# Patient Record
Sex: Female | Born: 1945 | Race: White | Hispanic: No | Marital: Married | Smoking: Former smoker
Health system: Southern US, Community
[De-identification: ages and names within clinical notes are randomized; demographics above are authoritative.]

## PROBLEM LIST (undated history)

## (undated) DIAGNOSIS — Z87442 Personal history of urinary calculi: Secondary | ICD-10-CM

## (undated) DIAGNOSIS — J90 Pleural effusion, not elsewhere classified: Secondary | ICD-10-CM

## (undated) DIAGNOSIS — I350 Nonrheumatic aortic (valve) stenosis: Secondary | ICD-10-CM

## (undated) DIAGNOSIS — H269 Unspecified cataract: Secondary | ICD-10-CM

## (undated) DIAGNOSIS — N189 Chronic kidney disease, unspecified: Secondary | ICD-10-CM

## (undated) DIAGNOSIS — I509 Heart failure, unspecified: Secondary | ICD-10-CM

## (undated) DIAGNOSIS — I38 Endocarditis, valve unspecified: Secondary | ICD-10-CM

## (undated) DIAGNOSIS — R278 Other lack of coordination: Secondary | ICD-10-CM

## (undated) DIAGNOSIS — M1711 Unilateral primary osteoarthritis, right knee: Secondary | ICD-10-CM

## (undated) DIAGNOSIS — J9622 Acute and chronic respiratory failure with hypercapnia: Secondary | ICD-10-CM

## (undated) DIAGNOSIS — E785 Hyperlipidemia, unspecified: Secondary | ICD-10-CM

## (undated) DIAGNOSIS — D72829 Elevated white blood cell count, unspecified: Secondary | ICD-10-CM

## (undated) DIAGNOSIS — I10 Essential (primary) hypertension: Secondary | ICD-10-CM

## (undated) DIAGNOSIS — J45909 Unspecified asthma, uncomplicated: Secondary | ICD-10-CM

## (undated) DIAGNOSIS — M5430 Sciatica, unspecified side: Secondary | ICD-10-CM

## (undated) DIAGNOSIS — J9621 Acute and chronic respiratory failure with hypoxia: Secondary | ICD-10-CM

## (undated) DIAGNOSIS — D649 Anemia, unspecified: Secondary | ICD-10-CM

## (undated) DIAGNOSIS — D509 Iron deficiency anemia, unspecified: Secondary | ICD-10-CM

## (undated) DIAGNOSIS — E119 Type 2 diabetes mellitus without complications: Secondary | ICD-10-CM

## (undated) DIAGNOSIS — N3281 Overactive bladder: Secondary | ICD-10-CM

## (undated) DIAGNOSIS — N183 Chronic kidney disease, stage 3 unspecified: Secondary | ICD-10-CM

## (undated) DIAGNOSIS — J449 Chronic obstructive pulmonary disease, unspecified: Secondary | ICD-10-CM

## (undated) DIAGNOSIS — R609 Edema, unspecified: Secondary | ICD-10-CM

## (undated) DIAGNOSIS — N289 Disorder of kidney and ureter, unspecified: Secondary | ICD-10-CM

## (undated) DIAGNOSIS — R001 Bradycardia, unspecified: Secondary | ICD-10-CM

## (undated) DIAGNOSIS — K5520 Angiodysplasia of colon without hemorrhage: Secondary | ICD-10-CM

## (undated) DIAGNOSIS — I5030 Unspecified diastolic (congestive) heart failure: Secondary | ICD-10-CM

## (undated) HISTORY — PX: APPENDECTOMY: SHX54

## (undated) HISTORY — DX: Acute and chronic respiratory failure with hypoxia: J96.21

## (undated) HISTORY — DX: Chronic kidney disease, unspecified: N18.9

## (undated) HISTORY — DX: Iron deficiency anemia, unspecified: D50.9

## (undated) HISTORY — DX: Nonrheumatic aortic (valve) stenosis: I35.0

## (undated) HISTORY — DX: Sciatica, unspecified side: M54.30

## (undated) HISTORY — PX: EYE SURGERY: SHX253

## (undated) HISTORY — DX: Acute and chronic respiratory failure with hypercapnia: J96.22

## (undated) HISTORY — DX: Unilateral primary osteoarthritis, right knee: M17.11

## (undated) HISTORY — DX: Edema, unspecified: R60.9

## (undated) HISTORY — DX: Unspecified cataract: H26.9

## (undated) HISTORY — DX: Elevated white blood cell count, unspecified: D72.829

## (undated) HISTORY — PX: CHOLECYSTECTOMY: SHX55

## (undated) HISTORY — DX: Other lack of coordination: R27.8

---

## 2014-04-02 ENCOUNTER — Inpatient Hospital Stay: Payer: Self-pay | Admitting: Internal Medicine

## 2014-04-02 LAB — CBC WITH DIFFERENTIAL/PLATELET
BASOS ABS: 0 10*3/uL (ref 0.0–0.1)
Basophil %: 0.2 %
EOS ABS: 0 10*3/uL (ref 0.0–0.7)
EOS PCT: 0.1 %
HCT: 35.2 % (ref 35.0–47.0)
HGB: 11.4 g/dL — ABNORMAL LOW (ref 12.0–16.0)
LYMPHS ABS: 0.6 10*3/uL — AB (ref 1.0–3.6)
Lymphocyte %: 2.7 %
MCH: 28.4 pg (ref 26.0–34.0)
MCHC: 32.5 g/dL (ref 32.0–36.0)
MCV: 87 fL (ref 80–100)
Monocyte #: 1.6 x10 3/mm — ABNORMAL HIGH (ref 0.2–0.9)
Monocyte %: 7.7 %
NEUTROS PCT: 89.3 %
Neutrophil #: 18.4 10*3/uL — ABNORMAL HIGH (ref 1.4–6.5)
PLATELETS: 171 10*3/uL (ref 150–440)
RBC: 4.03 10*6/uL (ref 3.80–5.20)
RDW: 14.3 % (ref 11.5–14.5)
WBC: 20.6 10*3/uL — ABNORMAL HIGH (ref 3.6–11.0)

## 2014-04-02 LAB — URINALYSIS, COMPLETE
Bilirubin,UR: NEGATIVE
Nitrite: POSITIVE
PH: 5 (ref 4.5–8.0)
SPECIFIC GRAVITY: 1.024 (ref 1.003–1.030)
Squamous Epithelial: <1
WBC UR: 90 /HPF (ref 0–5)

## 2014-04-02 LAB — COMPREHENSIVE METABOLIC PANEL
ALT: 20 U/L (ref 14–63)
Albumin: 3 g/dL — ABNORMAL LOW (ref 3.4–5.0)
Alkaline Phosphatase: 77 U/L (ref 46–116)
Anion Gap: 7 (ref 7–16)
BUN: 14 mg/dL (ref 7–18)
Bilirubin,Total: 1 mg/dL (ref 0.2–1.0)
Calcium, Total: 8.7 mg/dL (ref 8.5–10.1)
Chloride: 97 mmol/L — ABNORMAL LOW (ref 98–107)
Co2: 28 mmol/L (ref 21–32)
Creatinine: 0.77 mg/dL (ref 0.60–1.30)
EGFR (African American): >60
EGFR (Non-African Amer.): >60
Glucose: 292 mg/dL — ABNORMAL HIGH (ref 65–99)
Osmolality: 276 (ref 275–301)
Potassium: 5.6 mmol/L — ABNORMAL HIGH (ref 3.5–5.1)
SGOT(AST): 49 U/L — ABNORMAL HIGH (ref 15–37)
Sodium: 132 mmol/L — ABNORMAL LOW (ref 136–145)
TOTAL PROTEIN: 6.3 g/dL — AB (ref 6.4–8.2)

## 2014-04-02 LAB — BASIC METABOLIC PANEL
ANION GAP: 5 — AB (ref 7–16)
BUN: 13 mg/dL (ref 7–18)
CHLORIDE: 97 mmol/L — AB (ref 98–107)
CO2: 32 mmol/L (ref 21–32)
Calcium, Total: 8.7 mg/dL (ref 8.5–10.1)
Creatinine: 0.95 mg/dL (ref 0.60–1.30)
EGFR (African American): >60
EGFR (Non-African Amer.): >60
Glucose: 316 mg/dL — ABNORMAL HIGH (ref 65–99)
OSMOLALITY: 280 (ref 275–301)
POTASSIUM: 3.9 mmol/L (ref 3.5–5.1)
Sodium: 134 mmol/L — ABNORMAL LOW (ref 136–145)

## 2014-04-02 LAB — TROPONIN I: Troponin-I: <0.02 ng/mL

## 2014-04-02 LAB — LIPASE, BLOOD: Lipase: 27 U/L — ABNORMAL LOW (ref 73–393)

## 2014-04-02 LAB — MAGNESIUM: MAGNESIUM: 1.3 mg/dL — AB

## 2014-04-03 LAB — CBC WITH DIFFERENTIAL/PLATELET
BASOS PCT: 0.3 %
Basophil #: 0.1 10*3/uL (ref 0.0–0.1)
EOS ABS: 0.1 10*3/uL (ref 0.0–0.7)
Eosinophil %: 0.5 %
HCT: 36 % (ref 35.0–47.0)
HGB: 11.7 g/dL — ABNORMAL LOW (ref 12.0–16.0)
LYMPHS ABS: 1.4 10*3/uL (ref 1.0–3.6)
Lymphocyte %: 7.1 %
MCH: 28.8 pg (ref 26.0–34.0)
MCHC: 32.5 g/dL (ref 32.0–36.0)
MCV: 89 fL (ref 80–100)
MONO ABS: 1.1 x10 3/mm — AB (ref 0.2–0.9)
Monocyte %: 5.4 %
NEUTROS PCT: 86.7 %
Neutrophil #: 17 10*3/uL — ABNORMAL HIGH (ref 1.4–6.5)
Platelet: 157 10*3/uL (ref 150–440)
RBC: 4.07 10*6/uL (ref 3.80–5.20)
RDW: 14.1 % (ref 11.5–14.5)
WBC: 19.6 10*3/uL — AB (ref 3.6–11.0)

## 2014-04-03 LAB — BASIC METABOLIC PANEL
ANION GAP: 6 — AB (ref 7–16)
BUN: 15 mg/dL (ref 7–18)
CHLORIDE: 98 mmol/L (ref 98–107)
CREATININE: 0.89 mg/dL (ref 0.60–1.30)
Calcium, Total: 9.5 mg/dL (ref 8.5–10.1)
Co2: 31 mmol/L (ref 21–32)
EGFR (African American): >60
EGFR (Non-African Amer.): >60
Glucose: 201 mg/dL — ABNORMAL HIGH (ref 65–99)
Osmolality: 277 (ref 275–301)
Potassium: 4.1 mmol/L (ref 3.5–5.1)
SODIUM: 135 mmol/L — AB (ref 136–145)

## 2014-04-04 LAB — CBC WITH DIFFERENTIAL/PLATELET
BASOS ABS: 0 10*3/uL (ref 0.0–0.1)
Basophil %: 0.3 %
EOS ABS: 0.2 10*3/uL (ref 0.0–0.7)
Eosinophil %: 1.2 %
HCT: 33.6 % — ABNORMAL LOW (ref 35.0–47.0)
HGB: 10.8 g/dL — ABNORMAL LOW (ref 12.0–16.0)
LYMPHS ABS: 0.9 10*3/uL — AB (ref 1.0–3.6)
Lymphocyte %: 7.3 %
MCH: 28.3 pg (ref 26.0–34.0)
MCHC: 32.1 g/dL (ref 32.0–36.0)
MCV: 88 fL (ref 80–100)
MONO ABS: 1 x10 3/mm — AB (ref 0.2–0.9)
Monocyte %: 7.5 %
Neutrophil #: 10.6 10*3/uL — ABNORMAL HIGH (ref 1.4–6.5)
Neutrophil %: 83.7 %
PLATELETS: 160 10*3/uL (ref 150–440)
RBC: 3.82 10*6/uL (ref 3.80–5.20)
RDW: 14.3 % (ref 11.5–14.5)
WBC: 12.7 10*3/uL — ABNORMAL HIGH (ref 3.6–11.0)

## 2014-04-04 LAB — BASIC METABOLIC PANEL
ANION GAP: 6 — AB (ref 7–16)
BUN: 11 mg/dL (ref 7–18)
CHLORIDE: 95 mmol/L — AB (ref 98–107)
CO2: 35 mmol/L — AB (ref 21–32)
CREATININE: 0.75 mg/dL (ref 0.60–1.30)
Calcium, Total: 9.6 mg/dL (ref 8.5–10.1)
EGFR (African American): >60
EGFR (Non-African Amer.): >60
Glucose: 201 mg/dL — ABNORMAL HIGH (ref 65–99)
OSMOLALITY: 277 (ref 275–301)
Potassium: 3.6 mmol/L (ref 3.5–5.1)
Sodium: 136 mmol/L (ref 136–145)

## 2014-04-05 LAB — URINE CULTURE

## 2014-04-20 DIAGNOSIS — R609 Edema, unspecified: Secondary | ICD-10-CM

## 2014-04-20 DIAGNOSIS — R600 Localized edema: Secondary | ICD-10-CM | POA: Insufficient documentation

## 2014-04-20 DIAGNOSIS — I509 Heart failure, unspecified: Secondary | ICD-10-CM | POA: Insufficient documentation

## 2014-04-20 HISTORY — DX: Edema, unspecified: R60.9

## 2014-04-20 HISTORY — DX: Localized edema: R60.0

## 2014-06-18 ENCOUNTER — Ambulatory Visit
Admit: 2014-06-18 | Disposition: A | Discharge status: Home / Self Care | Payer: Self-pay | Attending: Urology | Admitting: Urology

## 2014-06-18 LAB — BASIC METABOLIC PANEL
Anion Gap: 8 (ref 7–16)
BUN: 22 mg/dL — AB
CO2: 32 mmol/L
Calcium, Total: 9.3 mg/dL
Chloride: 96 mmol/L — ABNORMAL LOW
Creatinine: 0.78 mg/dL
EGFR (Non-African Amer.): >60
GLUCOSE: 274 mg/dL — AB
Potassium: 4.5 mmol/L
Sodium: 136 mmol/L

## 2014-06-22 DIAGNOSIS — D509 Iron deficiency anemia, unspecified: Secondary | ICD-10-CM

## 2014-06-22 DIAGNOSIS — R809 Proteinuria, unspecified: Secondary | ICD-10-CM | POA: Insufficient documentation

## 2014-06-22 HISTORY — DX: Iron deficiency anemia, unspecified: D50.9

## 2014-06-29 ENCOUNTER — Ambulatory Visit
Admit: 2014-06-29 | Disposition: A | Discharge status: Home / Self Care | Payer: Self-pay | Attending: Urology | Admitting: Urology

## 2014-07-05 NOTE — Consult Note (Signed)
PATIENT NAME:  Karen [name], Karen [name] MR#:  [id number] DATE OF BIRTH:  1946/01/15  DATE OF CONSULTATION:  04/02/2014  REFERRING PHYSICIAN:  Posey Pronto, MD  CONSULTING PHYSICIAN:  Andria Meuse, NP  PRIMARY CARE PHYSICIAN:  Nonlocal.    CONSULTING GASTROENTEROLOGIST:  Lucilla Lame, MD   REASON FOR CONSULTATION: Bilious emesis.   HISTORY OF PRESENT ILLNESS: Karen [name] is a 69 year old Caucasian female who was in her usual state of health and developed severe abdominal pain that has lasted for 3 to 4 days. She ate barbecue with her son last night and began to have severe nausea and vomiting at 1:00 a.m. The pain is mostly in her right lower quadrant, is 8/10 on pain scale. She has seen no gross hematuria. She denies any heartburn or indigestion. Her weight has been stable. She denies any diarrhea or constipation. She denies any rectal bleeding or melena. She has never had a colonoscopy. She was admitted with a white blood cell count of 20.6, her hemoglobin is 11.4. CT scan of abdomen and pelvis shows moderate right-sided urinary tract obstruction from a proximal ureteral stone and mild hepatomegaly. She was admitted with UTI, sepsis and hyperkalemia with a potassium of 5.6.   PAST MEDICAL AND SURGICAL HISTORY: COPD, CHF, diabetes mellitus, hypertension, C-section, cholecystectomy, bilateral lens transplant and appendectomy.   MEDICATIONS PRIOR TO ADMISSION: Amlodipine 5 mg daily, aspirin 81 mg daily, atorvastatin 10 mg at bedtime, ferrous sulfate 325 mg daily, furosemide 20 mg daily, Levemir  40 units subcutaneously at bedtime; metformin 500 mg extended-release 3 tablets every morning, 1 tablet every evening; Montelukast 10 mg tablet daily, NovoLog subcutaneous sliding scale insulin.   ALLERGIES: GABAPENTIN AND LISINOPRIL.   FAMILY HISTORY: There is no known family history of colorectal carcinoma, liver or chronic GI problems.   SOCIAL HISTORY: She lives with her husband and son. She has 3 healthy  children. She is a retired Chief Financial Officer. She denies any tobacco, alcohol or illicit drug use. She quit smoking 25 years ago.   REVIEW OF SYSTEMS:  See history of present illness.  CONSTITUTIONAL: Overall she feels fatigue, malaise, weakness; she has had chills.  RESPIRATORY: She has shortness of breath on 3 liters of oxygen at home, worse recently, otherwise negative 12 point review of systems.   PHYSICAL EXAMINATION:  VITAL SIGNS: BMI 38.2, height 62.9 inches, weight 215.6 pounds, temperature is 98.1, pulse 114, blood pressure 147/64, respirations 20, with O2 saturation 90% on 3 liters via nasal cannula.  GENERAL: Karen [name] is alert, oriented, pleasant, and cooperative; she seems very uncomfortable, she is accompanied by her husband and her son.  HEENT: Sclerae clear, nonicteric, conjunctivae pink; oropharynx pink and moist; she is actively vomiting at the time of to the visit into a basin with orangeish, brown emesis. She has to O2 at 3 liters via nasal cannula.  NECK: Supple without any mass or thyromegaly.   CHEST: Heart mildly tachycardic. No murmurs, clicks, rubs, or gallops.  LUNGS: Clear to auscultation bilaterally.  ABDOMEN: Protuberant, positive bowel sounds x 4, no bruits auscultated; abdomen is moderately distended; she has right lower quadrant and right-sided tenderness on deep palpation, there is no rebound, tenderness or guarding; no hepatosplenomegaly or mass.  EXTREMITIES: Without edema, cyanosis or clubbing.  NEUROLOGIC: Grossly intact.  MUSCULOSKELETAL: Good equal movement and strength bilaterally.  PSYCHIATRIC: Alert, cooperative; she appears very uncomfortable with a flat affect.   DIAGNOSTIC DATA: Glucose 292, chloride 97, sodium 132, potassium 5.6, otherwise normal basic metabolic panel;  total protein 6.3, albumin 3, AST 49, otherwise normal LFTs. Lipase is normal. Urinalysis showed glucose, ketones, nitrites, LE, RBCs, WBCs and bacteria, lipase was normal and  chest x-ray was negative.   IMPRESSION: Karen [name] is a 69 year old Caucasian female admitted with acute abdominal pain, mostly right lower quadrant at this time, nausea and vomiting. I suspect her symptoms are related to urosepsis and obstructive uropathy from right proximal ureteral stones seen on CT, there was no evidence of ileus or gastric outlet obstruction on CT.   PLAN: 1.  Pain and antibiotic management per attending.  2.  Consider urology consult.  3.  Agree with Zofran as ordered.  4.  Agree with b.i.d. PPI for gastric protection.  5.  If nausea and vomiting persist despite resolution of the urinary issues would consider EGD.  6.  Due for outpatient screening colonoscopy.  7.  Follow and management of hyperkalemia per attending.   Thank you allowing [place] to participate in her care.    ____________________________ Andria Meuse, NP klj:nt D: 04/02/2014 15:38:44 ET T: 04/02/2014 16:05:40 ET JOB#: [id number]  cc: Andria Meuse, NP, <Dictator> Andria Meuse FNP ELECTRONICALLY SIGNED 04/04/2014 5:23

## 2014-07-05 NOTE — Consult Note (Signed)
Chief Complaint:  Subjective/Chief Complaint pod 1 s/p right ureteral stent placement Pain and nausea improved, tol POs, OOB/Ambulating. No fevers overnight   VITAL SIGNS/ANCILLARY NOTES: **Vital Signs.:   29-Jan-16 13:42  Vital Signs Type Routine  Temperature Temperature (F) 97.2  Celsius 36.2  Temperature Source oral  Pulse Pulse 98  Respirations Respirations 19  Systolic BP Systolic BP [id number]  Diastolic BP (mmHg) Diastolic BP (mmHg) 69  Mean BP 82  Pulse Ox % Pulse Ox % 95  Pulse Ox Activity Level  At rest  Oxygen Delivery 3L  *Intake and Output.:   Shift 29-Jan-16 15:00  Grand Totals Intake:  1440 Output:  800    Net:  640 24 Hr.:  640  Oral Intake      In:  1440  Urine ml     Out:  800  Length of Stay Totals Intake:  4000.97 Output:  1900    Net:  2100.97   Brief Assessment:  GEN well developed, well nourished   Cardiac Regular  no murmur  --Rub  --Gallop   Respiratory normal resp effort  clear BS   Gastrointestinal Normal   Gastrointestinal details normal Soft  Nontender   EXTR negative cyanosis/clubbing, negative edema   Additional Physical Exam no CVAT   Assessment/Plan:  Assessment/Plan:  Assessment 69 year old woman pod 1 s/p right ureteral stent placement for sepsis due to an obstructing proximal ureteral stone   Plan 1) D/C Foley catheter 2) Continue IV Antibiotics 3) Await final urine cultures -- will need to be discharged with appropriate PO antibiotics for 10-14 day total course 4) Will need follow-up with Urology in 1-2 weeks after discharge to arrange definitive ureteral stone management.  Thank you for including me in the care of Mrs. Jaques; please call Urology with any further questions   Electronic Signatures: Prentiss [name] (MD)  (Signed 29-Jan-16 15:38)  Authored: Chief Complaint, VITAL SIGNS/ANCILLARY NOTES, Brief Assessment, Assessment/Plan   Last Updated: 29-Jan-16 15:38 by Prentiss [name] (MD)

## 2014-07-05 NOTE — Op Note (Addendum)
PATIENT NAME:  Karen [name], STDENIS MR#:  [id number] DATE OF BIRTH:  [birth year]  DATE OF PROCEDURE:  06/29/2014  PREOPERATIVE DIAGNOSIS: Right ureteral calculus.   POSTOPERATIVE DIAGNOSIS: Passed right ureteral calculus.   PROCEDURE: Cystoscopy, right ureteroscopy, right retrograde pyelogram.     SURGEON: Richard D. Elnoria Howard, DO.   ANESTHESIA: General.   SPECIMEN: None.   DESCRIPTION OF PROCEDURE:  With the patient sterilely prepped and draped in supine lithotomy position for ease of approach to the external genitalia and after an appropriate timeout we begin the procedure. I take a cystoscope, go in the bladder, empty the bladder, and remove a well calcified right ureteral stent that has been in since January. I place a wire up the right ureter. This is an 0.036 Sensor wire. Next to the wire I take a short rigid ureteroscope, go all the way to the UPJ, find no calculi in the ureter. I can see where there was a calculus at one time, there were none in the ureter. Assuming the remainder of the calculus was pushed up into the kidney I do a flexible ureteroscopy after easily placing a short Navigator access sheath up the right ureter almost to the UPJ. There are no calculi seen within the 3 major calyces of the kidney. I do a retrograde to make sure that there are no other calyces other than these 3 major calyces. I enter each one of them with the flexible scope and find no calculi.  The papilla appear well delineated. No microcalculi within the papilla. There are large infundibulum so they are easy to enter. Once I have explored the kidney 3-4 times to make sure I have not missed any calyces and done a retrograde through the scope to make sure in all visualized major calyces, I then retract the scope, remove the wire, empty the bladder, put 30 mL of Marcaine in the bladder, and put a B and O suppository in the rectum. There are no rectal masses. The patient tolerated the procedure well and was sent to recovery  in satisfactory condition.    ____________________________ Janice Coffin. Elnoria Howard, DO rdh:bu D: 06/29/2014 12:13:36 ET T: 06/29/2014 16:02:04 ET JOB#: [id number]  cc: Janice Coffin. Elnoria Howard, DO, <Dictator> RICHARD D HART DO ELECTRONICALLY SIGNED 07/27/2014 15:13

## 2014-07-05 NOTE — Consult Note (Signed)
Chief Complaint:  Subjective/Chief Complaint Pt states abdominal pain resolved post stent with urology.  Denies any further emesis.   VITAL SIGNS/ANCILLARY NOTES: **Vital Signs.:   29-Jan-16 05:07  Vital Signs Type Routine  Temperature Temperature (F) 98.2  Celsius 36.7  Temperature Source oral  Pulse Pulse 88  Respirations Respirations 20  Systolic BP Systolic BP 761  Diastolic BP (mmHg) Diastolic BP (mmHg) 56  Mean BP 76  Pulse Ox % Pulse Ox % 95  Pulse Ox Activity Level  At rest  Oxygen Delivery 3L   Brief Assessment:  GEN well developed, well nourished, no acute distress, A/OX3   Cardiac Regular   Respiratory normal resp effort   Gastrointestinal Normal   Gastrointestinal details normal Soft  Nontender  Nondistended  Bowel sounds normal  No rebound tenderness  No gaurding   EXTR negative cyanosis/clubbing, negative edema   Additional Physical Exam Skin: warm, dry, intact   Lab Results:  Routine Chem:  29-Jan-16 06:26   Result Comment PLATELET - VERIFIED BY SMEAR ESTIMATE  Result(s) reported on 03 Apr 2014 at 07:39AM.  Glucose, Serum  201  BUN 15  Sodium, Serum  135  Potassium, Serum 4.1  Chloride, Serum 98  CO2, Serum 31  Calcium (Total), Serum 9.5  Anion Gap  6  Osmolality (calc) 277  eGFR (African American) >60  eGFR (Non-African American) >60 (eGFR values <26m/min/1.73 m2 may be an indication of chronic kidney disease (CKD). Calculated eGFR, using the MRDR Study equation, is useful in  patients with stable renal function. The eGFR calculation will not be reliable in acutely ill patients when serum creatinine is changing rapidly. It is not useful in patients on dialysis. The eGFR calculation may not be applicable to patients at the low and high extremes of body sizes, pregnant women, and vegetarians.)  Routine Hem:  29-Jan-16 06:26   WBC (CBC)  19.6  RBC (CBC) 4.07  Hemoglobin (CBC)  11.7  Hematocrit (CBC) 36.0  Platelet Count (CBC) 157  MCV  89  MCH 28.8  MCHC 32.5  RDW 14.1  Neutrophil % 86.7  Lymphocyte % 7.1  Monocyte % 5.4  Eosinophil % 0.5  Basophil % 0.3  Neutrophil #  17.0  Lymphocyte # 1.4  Monocyte #  1.1  Eosinophil # 0.1  Basophil # 0.1   Assessment/Plan:  Assessment/Plan:  Assessment Nausea/vomiting: Resolved.  Secondary to urosepsis & obstructive uropathy from right proximal ureteral stone s/p stent.   Plan 1) Call if recurrent nausea & vomiting 2) will arrange follow up for outpatient screening colonoscopy in 4 weeks Will sign off, Please call if you have any questions or concerns   Electronic Signatures: JAndria Meuse(NP)  (Signed 29-Jan-16 12:37)  Authored: Chief Complaint, VITAL SIGNS/ANCILLARY NOTES, Brief Assessment, Lab Results, Assessment/Plan   Last Updated: 29-Jan-16 12:37 by JAndria Meuse(NP)

## 2014-07-05 NOTE — Op Note (Signed)
Patient: This 69 year old F had a surgical procedure performed on 02-Apr-2014.  Post Operative Report:  Pre-Op Diagnosis Obstructive right ureteral calculus; sepsis   Post-Op Diagnosis Same   Operation Cystoscopy, right retrograde pyelogram, right ureteral stent placement   Anesthesia General   Specimen Type Describe  Urine (right renal pelvis) for culture   Findings As Above  Right hydronephrosis, purulent drainage from right kidney   Surgeon Chales Salmon, MD   EBL: Minimal   Complications None   Description of Procedure: INDICATIONS FOR PROCEDURE:  Mrs. Wattley is a 69 year-old woman with sepsis due to a 25mm right proximal ureteral stone. I discussed the risks, benefits and alternatives of ureteral stent placement with her DPOA, her daughter, and these include risk of damage to the ureter, kidney or bladder and urethra, worsening infection, bleeding, ureteral stricture or loss of kidney.  She elected to proceed with right ureteral stent placement in an effort to alleviate her infection.  DESCRIPTION OF PROCEDURE IN DETAIL:  After informed consent, Mrs Rotz was brought to the operating suite and placed in a supine position for administration of general anesthesia.  She was then repositioned in a low dorsal lithotomy and her lower abdomen, perineum and genitalia were then prepped and draped in the usual sterile fashion.  Perioperative IV Ancef and Gentamycin were given.    A 21 French rigid cystoscope was inserted per urethra. Cystoscopy showed evidence of cystitis within a normal capacity bladder with normal orthotopic ureteral orifices.    The right ureteral orifice was identified and intubated with a 6 [place] open-ended catheter.  An angled sensor wire was passed through the 6 French catheter into the ureter and the 6 French catheter and wire were advanced into the distal ureter. A gentle retrograde pyelogram showed a tortuous ureter, obstruction by the known stone in her proximal  ureter with hydronephrosis in the renal pelvis beyond the stone.  The catheter and wire were then advanced beyond the stone. Urine was then collected from the catheter and sent as a specimen from the right kidney.  There was drainage of purulent urine and a hydronephrotic drip.  The sensor wire was then advanced into the right renal pelvis and the catheter was removed. A 24 cm x 6 [employer] Percuflex ureteral stent was passed under direct cystoscopic and fluoroscopic guidance over the sensor wire.  It was deployed with a 360 degree coil seen in both the renal pelvis and the bladder.  The stent continued to drain purulent urine. The cystoscope was removed and a new 32 French Foley catheter was inserted per urethra and the balloon was inflated with 10cc and the catheter was allowed to gravity drainage.  This concluded the case.  The patient was awoken from anesthesia, extubated and brought to the PACU in stable condition.  She will be re-admitted to the medical service for observation. She will need definitive stone management in 1-2 weeks after her acute infection has resolved.   Electronic Signatures: Prentiss [name] (MD)  (Signed 28-Jan-16 23:12)  Authored: Patient and Date/Time, Operative Note   Last Updated: 28-Jan-16 23:12 by Prentiss [name] (MD)

## 2014-07-05 NOTE — Consult Note (Signed)
Brief Consult Note: Diagnosis: bilious emesis.   Patient was seen by consultant.   Comments: Ms. Fredrich is a 69 y/o female admitted with acute abdominal pain, mostly RLQ at this time, nausea & vomiting.  I suspect her symptoms are related to urosepsis & obstructive uropathy from right proximal ureteral stone seen on CT.  No evidence of ileus, gastric or bowel obstruction.  Hyperkalemia may be playing a role as well & should be monitored.  Plan: 1) Pain & antibiotic management per attending 2) Consider urology consult 3) Agree with zofran as ordered 4) Agree with PPI BID for gastric protection 5) If nausea, vomiting persist despite resolution of urinary issues, would consider EGD 6) Due for outpatient screening colonoscopy 7) monitor potassium for hyperkalemia Thanks for allowing [place] to participate in her care.  Please see full dictated note. [id number].  Electronic Signatures: Andria Meuse (NP)  (Signed 28-Jan-16 15:41)  Authored: Brief Consult Note   Last Updated: 28-Jan-16 15:41 by Andria Meuse (NP)

## 2014-07-05 NOTE — Discharge Summary (Signed)
PATIENT NAME:  Karen [name], Karen [name] MR#:  [id number] DATE OF BIRTH:  06-Sep-1945  DATE OF ADMISSION:  04/02/2014 DATE OF DISCHARGE:  04/05/2014  ADMITTING DIAGNOSIS: Abdominal pain, nausea, vomiting.   DISCHARGE DIAGNOSES: 1.  Abdominal pain due to urinary tract infection associated with nephrolithiasis.  2.  Sepsis due to urinary tract infection with Enterobacter cloacae in the urine.  3. Chronic obstructive pulmonary disease without any evidence of acute exacerbation.  4.  Chronic respiratory failure requiring 3 liters of oxygen. 5.  Chronic congestive heart failure, type unknown, p.r.n. Lasix as needed.  6.  Diabetes type 2.  7.  Hypertension.  8.  Morbid obesity.  9.  Status post cesarean section x 3.  10.  Status post cholecystectomy.  11.  Status post appendectomy.  12.  Status post bilateral lens transplantation.   CONSULTANTS:  Prentiss [name], MD, of urology. Andria Meuse, NP.  PERTINENT LABORATORY AND EVALUATIONS: Admitting glucose 292, BUN 14, creatinine 0.77, sodium 132, potassium was 5.6, chloride 97, CO2 of 28. Magnesium was 1.3. Lipase 27. LFTs showed a total protein of 6.3, albumin of 3.0, bilirubin total 1.0, AST 49, ALT 20. Troponin less than 0.02. WBC 20.6, hemoglobin 11.4, platelet count was 171,000. CBC, WBC count on January 30 was 12.7. Urinalysis showed Enterobacter cloacae greater than 100,000, resistant to cephazolin, but sensitive to ceftriaxone. Leukocytes were trace, WBCs were 30, bacteria 3+. CT scan of the abdomen and pelvis showed moderate right-sided urinary tract  obstruction secondary to a proximal ureteric stone, mild hepatomegaly. Chest x-ray, portable view, showed no evidence of active cardiopulmonary disease.   HOSPITAL COURSE: Please refer to H and P done by the admitting physician. The patient is a 69 year old white female who presented with abdominal pain and fevers. She was noted to have UTI on presentation. She also had a CT of the abdomen and an  obstructive renal stone was also noted. Therefore, urology consult was obtained. Dr. Deatra Ina saw the patient and she underwent cystoscopy, right retrograde pyelogram and right ureteral stent was placed. The patient tolerated the procedure well and she started feeling much better after.   The patient's pain had mostly resolved. She was recommended to follow up as outpatient with urology. At this time, she is stable for discharge.   DISCHARGE MEDICATIONS: Montelukast 10 mg daily, amlodipine 5 daily, atorvastatin 10 at bedtime, Lasix 20 daily as needed, metformin 500, 3 caps in the morning and 1 tab in the evening, iron sulfate 325 p.o. daily, aspirin 81, 1 tab p.o. daily, Levemir 40 units at bedtime, NovoLog sliding scale as taking previously, hydrocortisone topically to affected area 3 times a day as needed, acetaminophen/oxycodone 325/5 mg 1 tab p.o. q. 6 p.r.n. for pain, Ceftin 500, 1 tab p.o. b.i.d. x 7 days, oxygen at home 3 liters nasal cannula.   DIET: Low-sodium, low-fat, low-cholesterol, carbohydrate-controlled diet.   ACTIVITY: As tolerated.   DISCHARGE FOLLOWUP: Follow up with primary MD in 1-2 weeks. Follow up with Dr. Hollice Espy of urology in 1-2 weeks.    TIME SPENT: 35 minutes on the discharge.    ____________________________ Lafonda Mosses. Posey Pronto, MD shp:LT D: 04/06/2014 18:58:00 ET T: 04/06/2014 21:04:08 ET JOB#: [id number]  cc: Daziya Redmond H. Posey Pronto, MD, <Dictator> Alric Seton MD ELECTRONICALLY SIGNED 04/09/2014 12:34

## 2014-07-05 NOTE — H&P (Signed)
PATIENT NAME:  Karen [name], PALL MR#:  [id number] DATE OF BIRTH:  1946-02-14  DATE OF ADMISSION:  04/02/2014  REFERRING PHYSICIAN:  Hinda Kehr, M.D.   PRIMARY CARE PHYSICIAN: Nonlocal.   ADMISSION DIAGNOSIS: Sepsis and urinary tract infection.   HISTORY OF PRESENT ILLNESS: This is a 69 year old Caucasian female who presents to the Emergency Department complaining of abdominal pain. The patient states that she had a few episodes of first bilious then dark-colored emesis earlier this evening. She went to bed but awoke in the middle of the night due to the abdominal pain. In the Emergency Department, the patient was found to be tachycardic and tachypneic as well as with a urinary tract infection. She has recently moved from [place] and does not have a primary care doctor at this time. Due to her overall poor condition and lack of outpatient follow-up, the Emergency Department called for admission.   REVIEW OF SYSTEMS: CONSTITUTIONAL: The patient admits to subjective fevers and generalized weakness.  EYES: Denies blurred vision or inflammation.  EARS, NOSE AND THROAT: Denies tinnitus or sore throat.  RESPIRATORY: Denies cough, but admits to shortness of breath at baseline (the patient wears continuous oxygen at 3 liters per minute by nasal cannula.)  CARDIOVASCULAR: Denies chest pain, palpitations, orthopnea, paroxysmal nocturnal dyspnea.  GASTROINTESTINAL: Admits to nausea, vomiting and abdominal pain. The patient denies diarrhea. Her last bowel movement was yesterday and was normal in color and character.  GENITOURINARY: The patient admits to increased frequency of urination, but denies dysuria or hesitancy.  HEMATOLOGIC AND LYMPHATIC: Denies easy bruising or bleeding.  ENDOCRINE: Denies polyuria or polydipsia.  INTEGUMENT: Denies rashes or lesions.  MUSCULOSKELETAL: Denies myalgias or arthralgias.  NEUROLOGIC: Denies numbness in her extremities or difficulty speaking.  PSYCHIATRIC: Denies  depression or suicidal ideation.   PAST MEDICAL HISTORY: COPD, diabetes mellitus type 2, congestive heart failure, hypertension.   PAST SURGICAL HISTORY: Cesarean section x3, cholecystectomy, appendectomy and bilateral lens transplantation.   SOCIAL HISTORY: The patient lives with her husband and her son. She denies smoking, drinking or doing any illegal drugs.   FAMILY HISTORY: None.   MEDICATIONS: 1.  Amlodipine 5 mg 1 tablet p.o. daily.  2.  Aspirin 81 mg 1 tab p.o. daily.  3.  Atorvastatin 10 mg 1 tablet p.o. at bedtime.  4.  Ferrous sulfate 325 mg 1 tab p.o. daily.  5.  Furosemide 20 mg 1 tablet p.o. daily as needed.  6.  Levemir 40 units subcutaneously at bedtime.  7.  Metformin 500 mg extended release 3 tablets p.o. every morning and 1 tablet p.o. every evening.  8.  Montelukast 10 mg 1 tablet p.o. daily.  9.  NovoLog subcutaneously per sliding scale.   ALLERGIES: GABAPENTIN AND LISINOPRIL.   PERTINENT LABORATORY RESULTS AND RADIOGRAPHIC FINDINGS: Serum glucose 292, BUN 14, creatinine 0.77, serum sodium 132, potassium 5.6, chloride 97, bicarb 28, calcium 8.7. Lipase 27. Magnesium 1.3. Serum albumin 3, alk phos 77, AST 49, ALT 20. Troponin is negative. White blood cell count 20.6, hemoglobin 11.4, hematocrit 35.2, platelet count 171,000, MCV 87.   Urinalysis is positive for nitrites as well as leukocyte esterase. There are 90 white blood cells per high-power field and 3+ bacteria seen.   Chest x-ray shows no evidence of active cardiopulmonary disease.   PHYSICAL EXAMINATION: VITAL SIGNS: Temperature 99.5, pulse 102, respirations 20, blood pressure 127/53, pulse oximetry 92% on 3 liters oxygen via nasal cannula.  GENERAL: The patient is alert and oriented x3, in no apparent  distress.  HEENT: Normocephalic, atraumatic. Pupils equal, round and reactive to light and accommodation. Extraocular movements are intact. Mucous membranes are moist.  NECK: Trachea is midline. No  adenopathy. Thyroid is nonpalpable, nontender.  CHEST: Symmetric, atraumatic.  CARDIOVASCULAR: Tachycardic rate, but normal rhythm. Normal S1, S2. No rubs, clicks, or murmurs appreciated.  LUNGS: Clear to auscultation bilaterally. Normal effort and excursion. The patient is wearing oxygen via nasal cannula.  ABDOMEN: Positive bowel sounds. Soft, diffusely tender without guarding or rebound tenderness. There is no hepatosplenomegaly.  GENITOURINARY: Deferred.  MUSCULOSKELETAL: The patient moves all 4 extremities equally, but I have not tested her gait or strength.  SKIN: Warm and dry. No rashes or lesions.  EXTREMITIES: No clubbing, cyanosis, or edema.  NEUROLOGIC: Cranial nerves II through XII are grossly intact.  PSYCHIATRIC: Mood is normal. Affect is congruent. The patient has excellent insight and judgment into her medical condition.   ASSESSMENT AND PLAN: This is a 69 year old female admitted for urinary tract infection, sepsis.  1.  Sepsis. The patient meets criteria via tachycardia, tachypnea and leukocytosis. She is hemodynamically stable. It is unlikely that she has septicemia at this point. Will treat her urinary tract infection. Vitals should improve with some fluid resuscitation.  2.  Urinary tract infection. The patient is given ceftriaxone in the Emergency Department. I will continue this antibiotic. No need for adding vancomycin at this time as there is no suspicion that she may have what you call methicillin-resistant Staphylococcus aureus.  3.  Abdominal pain. This is likely associated with her urinary tract infection, but the patient has already had some bilious emesis as well as some dark-colored emesis. I have ordered a CT scan to rule out ileus. I have also placed a gastroenterology consultation for possible gastritis or ulcer formation that could contribute to some of these symptoms.  4.  Chronic obstructive pulmonary disease. The patient is continuously on 3 liters of oxygen by  nasal cannula at home. She is stable from a respiratory standpoint. We will continue her inhalers per her home regimen.  5.  Congestive heart failure, also stable. The patient only takes Lasix as needed and we will continue this as well.  6.  Diabetes type 2. We will continue the patient's basal insulin at two-thirds of her home dose due to calorie-reduced hospital diet. She will also receive sliding scale insulin while in the hospital.  7.  Hypertension. Continue amlodipine.  8.  Deep vein thrombosis prophylaxis. Heparin.  9.  Gastrointestinal prophylaxis. I have ordered IV pantoprazole for the patient as she may have an upper gastrointestinal bleed. We may cancel this at the discretion of the primary team and/or gastroenterology input.   CODE STATUS: The patient is a FULL code.   TIME SPENT ON ADMISSION ORDERS AND PATIENT CARE: Approximately 40 minutes. ____________________________ Norva Riffle. Marcille Blanco, MD msd:sb D: 04/02/2014 08:03:20 ET T: 04/02/2014 08:21:25 ET JOB#: [id number]  cc: Norva Riffle. Marcille Blanco, MD, <Dictator> Norva Riffle Ricci Dirocco MD ELECTRONICALLY SIGNED 04/08/2014 7:10

## 2014-07-05 NOTE — Consult Note (Signed)
Chief Complaint:  Subjective/Chief Complaint pod 2 s/p right ureteral stent placement. Complaining of itching and rash due to ted hose on right lower extremity. Pain and nausea improved, tol POs, OOB/Ambulating. No fevers overnight   VITAL SIGNS/ANCILLARY NOTES: **Vital Signs.:   30-Jan-16 07:41  Vital Signs Type Routine  Temperature Temperature (F) 98.7  Celsius 37  Pulse Pulse 94  Respirations Respirations 18  Systolic BP Systolic BP 962  Diastolic BP (mmHg) Diastolic BP (mmHg) 74  Mean BP 92  Pulse Ox % Pulse Ox % 96  Pulse Ox Activity Level  At rest  Oxygen Delivery 3L  *Intake and Output.:   Daily 30-Jan-16 07:00  Grand Totals Intake:  1680 Output:  3050    Net:  -1370 24 Hr.:  -1370  Oral Intake      In:  1680  Urine ml     Out:  3050  Length of Stay Totals Intake:  4240.97 Output:  4150    Net:  90.97   Brief Assessment:  GEN well developed, well nourished   Cardiac Regular  no murmur  --Rub  --Gallop   Respiratory normal resp effort  clear BS  O2 dependant, on [place] 3L   Gastrointestinal Normal   Gastrointestinal details normal Soft  Nontender   EXTR negative cyanosis/clubbing, negative edema   Additional Physical Exam no CVAT   Lab Results: Routine Chem:  30-Jan-16 06:35   Glucose, Serum  201  BUN 11  Creatinine (comp) 0.75  Sodium, Serum 136  Potassium, Serum 3.6  Chloride, Serum  95  CO2, Serum  35  Calcium (Total), Serum 9.6  Anion Gap  6  Osmolality (calc) 277  eGFR (African American) >60  eGFR (Non-African American) >60 (eGFR values <32m/min/1.73 m2 may be an indication of chronic kidney disease (CKD). Calculated eGFR, using the MRDR Study equation, is useful in  patients with stable renal function. The eGFR calculation will not be reliable in acutely ill patients when serum creatinine is changing rapidly. It is not useful in patients on dialysis. The eGFR calculation may not be applicable to patients at the low and high extremes of  body sizes, pregnant women, and vegetarians.)  Routine Hem:  30-Jan-16 06:35   WBC (CBC)  12.7  RBC (CBC) 3.82  Hemoglobin (CBC)  10.8  Hematocrit (CBC)  33.6  Platelet Count (CBC) 160  MCV 88  MCH 28.3  MCHC 32.1  RDW 14.3  Neutrophil % 83.7  Lymphocyte % 7.3  Monocyte % 7.5  Eosinophil % 1.2  Basophil % 0.3  Neutrophil #  10.6  Lymphocyte #  0.9  Monocyte #  1.0  Eosinophil # 0.2  Basophil # 0.0 (Result(s) reported on 04 Apr 2014 at 07:30AM.)   Assessment/Plan:  Invasive Device Daily Assessment of Necessity:  Does the patient currently have any of the following indwelling devices? foley   Indwelling Urinary Catheter no longer required, order entered to discontinue   Assessment/Plan:  Assessment 69year old woman pod 2 s/p right ureteral stent placement for sepsis due to an obstructing proximal ureteral stone   Plan 1) Foley catheter out 2) Continue IV Antibiotics 3) Await final urine cultures -- will need to be discharged with appropriate PO antibiotics for 10-14 day total course 4) Will need follow-up with Urology in 1-2 weeks after discharge to arrange definitive ureteral stone management. 5) Benadryl 250mPO now for itching RLE  Thank you for including me in the care of Karen [name] call Urology with  any further questions   Electronic Signatures: Prentiss [name] (MD)  (Signed 30-Jan-16 10:31)  Authored: Chief Complaint, VITAL SIGNS/ANCILLARY NOTES, Brief Assessment, Lab Results, Assessment/Plan   Last Updated: 30-Jan-16 10:31 by Prentiss [name] (MD)

## 2014-07-05 NOTE — Consult Note (Signed)
Chief Complaint:  Subjective/Chief Complaint pod 3 s/p right ureteral stent placement. Doing well. No fevers; Pain and nausea resolved, tol POs, OOB/Ambulating.   VITAL SIGNS/ANCILLARY NOTES: **Vital Signs.:   31-Jan-16 05:54  Vital Signs Type Routine  Temperature Temperature (F) 98.4  Celsius 36.8  Temperature Source oral  Pulse Pulse 93  Respirations Respirations 20  Systolic BP Systolic BP 811  Diastolic BP (mmHg) Diastolic BP (mmHg) 70  Mean BP 99  Pulse Ox % Pulse Ox % 93  Pulse Ox Activity Level  At rest  Oxygen Delivery 3L  Telemetry pattern Cardiac Rhythm Sinus arrythmia  *Intake and Output.:   Shift 31-Jan-16 15:00  Grand Totals Intake:  240 Output:      Net:  240 24 Hr.:  240  Oral Intake      In:  240  Length of Stay Totals Intake:  4960.97 Output:  5150    Net:  -189.03   Brief Assessment:  GEN well developed   Cardiac Regular  no murmur  --Rub  --Gallop   Respiratory normal resp effort  clear BS  O2 dependant, on [place] 3L   Gastrointestinal Normal   Gastrointestinal details normal Soft  Nontender   EXTR negative cyanosis/clubbing, negative edema   Additional Physical Exam no CVAT   Lab Results: Routine Chem:  30-Jan-16 06:35   Glucose, Serum  201  BUN 11  Creatinine (comp) 0.75  Sodium, Serum 136  Potassium, Serum 3.6  Chloride, Serum  95  CO2, Serum  35  Calcium (Total), Serum 9.6  Anion Gap  6  Osmolality (calc) 277  eGFR (African American) >60  eGFR (Non-African American) >60 (eGFR values <36m/min/1.73 m2 may be an indication of chronic kidney disease (CKD). Calculated eGFR, using the MRDR Study equation, is useful in  patients with stable renal function. The eGFR calculation will not be reliable in acutely ill patients when serum creatinine is changing rapidly. It is not useful in patients on dialysis. The eGFR calculation may not be applicable to patients at the low and high extremes of body sizes, pregnant women, and vegetarians.)   Routine Hem:  30-Jan-16 06:35   WBC (CBC)  12.7  RBC (CBC) 3.82  Hemoglobin (CBC)  10.8  Hematocrit (CBC)  33.6  Platelet Count (CBC) 160  MCV 88  MCH 28.3  MCHC 32.1  RDW 14.3  Neutrophil % 83.7  Lymphocyte % 7.3  Monocyte % 7.5  Eosinophil % 1.2  Basophil % 0.3  Neutrophil #  10.6  Lymphocyte #  0.9  Monocyte #  1.0  Eosinophil # 0.2  Basophil # 0.0 (Result(s) reported on 04 Apr 2014 at 07:30AM.)   Assessment/Plan:  Assessment/Plan:  Assessment 69year old woman pod 2 s/p right ureteral stent placement for sepsis due to an obstructing proximal ureteral stone. UCx shows enterobacter sensitive to cipro.   Plan 1) OK to D/C home today with PO Cipro for 10 day total course, vicodin or percocet for pain, ditropan 527mTID prn bladder spasm and flomax 0.26m65maily until surgery. 2) Will need follow-up with Urology in 1-2 weeks after discharge to arrange definitive ureteral stone management.   Urology will sign off. Thank you for including me in the care of Karen [name] call with any further questions   Electronic Signatures: KapPrentiss BellsD)  (Signed 31-Jan-16 10:47)  Authored: Chief Complaint, VITAL SIGNS/ANCILLARY NOTES, Brief Assessment, Lab Results, Assessment/Plan   Last Updated: 31-Jan-16 10:47 by KapPrentiss BellsD)

## 2014-07-05 NOTE — Consult Note (Signed)
Admit Diagnosis:   SEPSIS URINARY TRACT INFECTION CHRONIC O: Onset Date: 02-Apr-2014, Status: Active, Description: SEPSIS URINARY TRACT INFECTION CHRONIC O    COPD:    Diabetes:    HTN:    Cholecystectomy:    Cesarean Section: X3  Home Medications: Medication Instructions Status  furosemide 20 mg oral tablet 1 tab(s) orally once a day, As Needed Active  aspirin 81 mg oral tablet, chewable 1 tab(s) orally once a day Active  amLODIPine 5 mg oral tablet 1 tab(s) orally once a day Active  metFORMIN 500 mg oral tablet, extended release 3 tab(s) orally once a day (in the morning) 1 tab(s) orally in the evening Active  ferrous sulfate 325 mg oral tablet 1 tab(s) orally once a day Active  atorvastatin 10 mg oral tablet 1 tab(s) orally once a day (at bedtime) Active  montelukast 10 mg oral tablet 1 tab(s) orally once a day (in the evening) Active  NovoLOG 100 units/mL subcutaneous solution  subcutaneous sliding scale Active  Levemir 100 units/mL subcutaneous solution 40 unit(s) subcutaneous once a day (at bedtime) Active   Lab Results: Hepatic:  28-Jan-16 05:17   Bilirubin, Total 1.0  Alkaline Phosphatase 77  SGPT (ALT) 20  SGOT (AST)  49  Total Protein, Serum  6.3  Albumin, Serum  3.0  Routine Chem:  28-Jan-16 05:17   Glucose, Serum  292  BUN 14  Creatinine (comp) 0.77  Sodium, Serum  132  Potassium, Serum  5.6  Chloride, Serum  97  CO2, Serum 28  Calcium (Total), Serum 8.7  Anion Gap 7  Osmolality (calc) 276  eGFR (African American) >60  eGFR (Non-African American) >60 (eGFR values <76m/min/1.73 m2 may be an indication of chronic kidney disease (CKD). Calculated eGFR, using the MRDR Study equation, is useful in  patients with stable renal function. The eGFR calculation will not be reliable in acutely ill patients when serum creatinine is changing rapidly. It is not useful in patients on dialysis. The eGFR calculation may not be applicable to patients at the low  and high extremes of body sizes, pregnant women, and vegetarians.)  Result Comment POTASSIUM/CREATININE/BUN - Slight hemolysis, interpret results with MAGNESIUM/AST - caution.  Result(s) reported on 02 Apr 2014 at 05:52AM.  Lipase  27 (Result(s) reported on 02 Apr 2014 at 06:19AM.)  Magnesium, Serum  1.3 (1.8-2.4 THERAPEUTIC RANGE: 4-7 mg/dL TOXIC: > 10 mg/dL  -----------------------)    16:10   Glucose, Serum  316  BUN 13  Creatinine (comp) 0.95  Sodium, Serum  134  Potassium, Serum 3.9  Chloride, Serum  97  CO2, Serum 32  Calcium (Total), Serum 8.7  Anion Gap  5  Osmolality (calc) 280  eGFR (African American) >60  eGFR (Non-African American) >60 (eGFR values <623mmin/1.73 m2 may be an indication of chronic kidney disease (CKD). Calculated eGFR, using the MRDR Study equation, is useful in  patients with stable renal function. The eGFR calculation will not be reliable in acutely ill patients when serum creatinine is changing rapidly. It is not useful in patients on dialysis. The eGFR calculation may not be applicable to patients at the low and high extremes of body sizes, pregnant women, and vegetarians.)  Cardiac:  28-Jan-16 05:17   Troponin I < 0.02 (0.00-0.05 0.05 ng/mL or less: NEGATIVE  Repeat testing in 3-6 hrs  if clinically indicated. >0.05 ng/mL: POTENTIAL  MYOCARDIAL INJURY. Repeat  testing in 3-6 hrs if  clinically indicated. NOTE: An increase or decrease  of 30% or more  on serial  testing suggests a  clinically important change)  Routine UA:  28-Jan-16 05:17   Color (UA) Yellow  Clarity (UA) Hazy  Glucose (UA) >=500  Bilirubin (UA) Negative  Ketones (UA) Trace  Specific Gravity (UA) 1.024  Blood (UA) 2+  pH (UA) 5.0  Protein (UA) 100 mg/dL  Nitrite (UA) Positive  Leukocyte Esterase (UA) Trace (Result(s) reported on 02 Apr 2014 at 06:48AM.)  RBC (UA) 27 /HPF  WBC (UA) 90 /HPF  Bacteria (UA) 3+  Epithelial Cells (UA) <1 /HPF  WBC Clump (UA)  PRESENT (Result(s) reported on 02 Apr 2014 at 06:48AM.)  Routine Hem:  28-Jan-16 05:17   WBC (CBC)  20.6  RBC (CBC) 4.03  Hemoglobin (CBC)  11.4  Hematocrit (CBC) 35.2  Platelet Count (CBC) 171  MCV 87  MCH 28.4  MCHC 32.5  RDW 14.3  Neutrophil % 89.3  Lymphocyte % 2.7  Monocyte % 7.7  Eosinophil % 0.1  Basophil % 0.2  Neutrophil #  18.4  Lymphocyte #  0.6  Monocyte #  1.6  Eosinophil # 0.0  Basophil # 0.0 (Result(s) reported on 02 Apr 2014 at 05:52AM.)   Radiology Results:  Radiology Results: CT:    28-Jan-16 08:23, CT Abdomen and Pelvis With Contrast  CT Abdomen and Pelvis With Contrast  REASON FOR EXAM:    (1) abd pain; (2) pel pain  COMMENTS:       PROCEDURE: CT  - CT ABDOMEN / PELVIS  W  - Apr 02 2014  8:23AM     CLINICAL DATA:  Initial encounter for nausea, vomiting, and  umbilical pain for 3 days. Fever. Elevated white blood cell count.    EXAM:  CT ABDOMEN AND PELVIS WITH CONTRAST    TECHNIQUE:  Multidetector CT imaging of the abdomen and pelvis was performed  using the standard protocol following bolus administration of  intravenous contrast.  CONTRAST:  100 cc Omnipaque 300    COMPARISON:  None    FINDINGS:  Lower chest: Left base subsegmental atelectasis. Mild cardiomegaly,  without pericardial or pleural effusion.    Hepatobiliary: Mild hepatomegaly, greater than 18 cm. Mild focal  steatosis in the posterior aspect of segment 4 of the liver.  Cholecystectomy, without biliary ductal dilatation.    Pancreas: Mildly atrophic pancreas, without duct dilatation or acute  inflammation.  Spleen: Normal    Adrenals/Urinary Tract: Normal adrenal glands. Normal left kidney.  Moderate right-sided urinary tract obstruction secondary to a 6 x 9  mm proximal right ureteric stone. Normal urinary bladder.    Stomach/Bowel: Normal stomach, without wall thickening. Minimal  scattered colonic diverticula. Normal terminal ileum.Normal  small  bowel.    Vascular/Lymphatic: Aortic and branch vessel atherosclerosis. No  abdominopelvic adenopathy.    Reproductive: Normal uterus and adnexa.  Other: No significant free fluid.    Musculoskeletal: Trace L4-5 anterolisthesis.     IMPRESSION:  1. moderate right-sided urinary tract obstruction secondary to a  proximal ureteric stone.  2. Mild hepatomegaly.      Electronically Signed    By: Abigail Miyamoto M.D.    On: 04/02/2014 08:30       Verified By: Areta Haber, M.D.,    Gabapentin: Anaphylaxis  Lisinopril: Swelling  Nursing Flowsheets: **Vital Signs.:   28-Jan-16 08:49  Vital Signs Type Admission  Celsius 36.5  Temperature Source oral  Pulse Pulse 95  Respirations Respirations 20  Systolic BP Systolic BP 341  Diastolic BP (mmHg) Diastolic BP (mmHg) 68  Mean BP 94  Pulse Ox % Pulse Ox % 92  Pulse Ox Activity Level  At rest    13:19  Vital Signs Type Routine  Temperature Temperature (F) 98.1  Celsius 36.7  Temperature Source oral  Pulse Pulse 114  Respirations Respirations 20  Systolic BP Systolic BP 407  Diastolic BP (mmHg) Diastolic BP (mmHg) 64  Mean BP 91  Pulse Ox % Pulse Ox % 90  Pulse Ox Activity Level  At rest  Oxygen Delivery 3L  Telemetry pattern Cardiac Rhythm Sinus arrythmia; pattern reported by Telemetry Clerk; 125  *Intake and Output.:   Shift 28-Jan-16 23:00  Grand Totals Intake:  1600.97 Output:      Net:  1600.97 63 Hr.:  1810.97  IV (Primary)      In:  1600.97  Length of Stay Totals Intake:  2560.97 Output:  750    Net:  1810.97    General Aspect 69 year old woman with multiple medical issues (COPD, DM, obesity) admitted for abdominal pain found to have an obstructing right ureteral calculus.  She developed severe stabbing, colicky right flank pain radiating to her right abdomen 2 days ago that has not relented.  She had subjective fevers last night, nausea and vomiting. She has noticed some dysuria and foul smelling urine  as well.   Case History and Physical Exam:  Past Medical Health Hypertension, Diabetes Mellitus, COPD   Past Surgical History Cholecystectomy   Primary Care Provider [facility]   Family History Non-Contributory   HEENT PERLA  using nasal O2   Neck/Nodes Supple  No Adenopathy   Chest/Lungs Clear  baseline   Cardiovascular No Murmurs or Gallops  Normal Sinus Rhythm   Abdomen mild right CVAT, otherwise soft/NT   Musculoskeletal Full range of motion   Neurological Grossly WNL   Skin Warm  Dry    Impression 69 year old woman with an obstructing 55m right ureteral stone, fever, leukocytosis (WBC 20), positive urinalysis, concerning for sepsis due to obstructive pyelonephritis.   Plan 1) Will plan for OR tonight for cystoscopy, right retrograde pyelogram and right ureteral stent placement emergently 2) NPO 3) IV Abx 4) Pain control and anti-emetics 5) Following stent placement, she will need Urology follow-up in 1-2 weeks to plan definitive ureteral stone treatment.   Electronic Signatures: KPrentiss [name](MD)  (Signed 28-Jan-16 20:54)  Authored: Health Issues, Significant Events - History, Home Medications, Labs, Radiology Results, Allergies, Vital Signs, General Aspect/Present Illness, History and Physical Exam, Impression/Plan   Last Updated: 28-Jan-16 20:54 by KPrentiss [name](MD)

## 2014-09-21 ENCOUNTER — Other Ambulatory Visit: Payer: Self-pay | Admitting: Family Medicine

## 2014-09-21 DIAGNOSIS — Z13820 Encounter for screening for osteoporosis: Secondary | ICD-10-CM

## 2015-01-04 DIAGNOSIS — G8929 Other chronic pain: Secondary | ICD-10-CM | POA: Insufficient documentation

## 2015-01-04 DIAGNOSIS — M5441 Lumbago with sciatica, right side: Secondary | ICD-10-CM

## 2015-01-04 DIAGNOSIS — M5442 Lumbago with sciatica, left side: Secondary | ICD-10-CM

## 2015-01-07 ENCOUNTER — Observation Stay: Payer: Medicare Other

## 2015-01-07 ENCOUNTER — Emergency Department: Payer: Medicare Other

## 2015-01-07 ENCOUNTER — Observation Stay
Admission: EM | Admit: 2015-01-07 | Discharge: 2015-01-09 | Disposition: A | Discharge status: Home / Self Care | Payer: Medicare Other | Attending: Internal Medicine | Admitting: Internal Medicine

## 2015-01-07 ENCOUNTER — Encounter: Payer: Self-pay | Admitting: Emergency Medicine

## 2015-01-07 DIAGNOSIS — Z7984 Long term (current) use of oral hypoglycemic drugs: Secondary | ICD-10-CM | POA: Diagnosis not present

## 2015-01-07 DIAGNOSIS — M5430 Sciatica, unspecified side: Secondary | ICD-10-CM

## 2015-01-07 DIAGNOSIS — I130 Hypertensive heart and chronic kidney disease with heart failure and stage 1 through stage 4 chronic kidney disease, or unspecified chronic kidney disease: Secondary | ICD-10-CM | POA: Insufficient documentation

## 2015-01-07 DIAGNOSIS — Z6839 Body mass index (BMI) 39.0-39.9, adult: Secondary | ICD-10-CM | POA: Insufficient documentation

## 2015-01-07 DIAGNOSIS — R4182 Altered mental status, unspecified: Secondary | ICD-10-CM | POA: Diagnosis present

## 2015-01-07 DIAGNOSIS — M5431 Sciatica, right side: Secondary | ICD-10-CM | POA: Insufficient documentation

## 2015-01-07 DIAGNOSIS — R17 Unspecified jaundice: Secondary | ICD-10-CM | POA: Insufficient documentation

## 2015-01-07 DIAGNOSIS — Z79891 Long term (current) use of opiate analgesic: Secondary | ICD-10-CM | POA: Diagnosis not present

## 2015-01-07 DIAGNOSIS — E669 Obesity, unspecified: Secondary | ICD-10-CM | POA: Diagnosis not present

## 2015-01-07 DIAGNOSIS — Z7982 Long term (current) use of aspirin: Secondary | ICD-10-CM | POA: Insufficient documentation

## 2015-01-07 DIAGNOSIS — N189 Chronic kidney disease, unspecified: Secondary | ICD-10-CM | POA: Insufficient documentation

## 2015-01-07 DIAGNOSIS — Z87891 Personal history of nicotine dependence: Secondary | ICD-10-CM | POA: Diagnosis not present

## 2015-01-07 DIAGNOSIS — G8929 Other chronic pain: Secondary | ICD-10-CM | POA: Insufficient documentation

## 2015-01-07 DIAGNOSIS — J449 Chronic obstructive pulmonary disease, unspecified: Secondary | ICD-10-CM | POA: Diagnosis not present

## 2015-01-07 DIAGNOSIS — R29898 Other symptoms and signs involving the musculoskeletal system: Secondary | ICD-10-CM

## 2015-01-07 DIAGNOSIS — M545 Low back pain: Secondary | ICD-10-CM | POA: Insufficient documentation

## 2015-01-07 DIAGNOSIS — Z794 Long term (current) use of insulin: Secondary | ICD-10-CM | POA: Insufficient documentation

## 2015-01-07 DIAGNOSIS — E1122 Type 2 diabetes mellitus with diabetic chronic kidney disease: Secondary | ICD-10-CM | POA: Diagnosis not present

## 2015-01-07 DIAGNOSIS — M4316 Spondylolisthesis, lumbar region: Secondary | ICD-10-CM | POA: Insufficient documentation

## 2015-01-07 DIAGNOSIS — J9622 Acute and chronic respiratory failure with hypercapnia: Secondary | ICD-10-CM | POA: Diagnosis not present

## 2015-01-07 DIAGNOSIS — R531 Weakness: Secondary | ICD-10-CM | POA: Diagnosis not present

## 2015-01-07 DIAGNOSIS — J9621 Acute and chronic respiratory failure with hypoxia: Secondary | ICD-10-CM | POA: Insufficient documentation

## 2015-01-07 DIAGNOSIS — E11649 Type 2 diabetes mellitus with hypoglycemia without coma: Secondary | ICD-10-CM | POA: Insufficient documentation

## 2015-01-07 DIAGNOSIS — Z79899 Other long term (current) drug therapy: Secondary | ICD-10-CM | POA: Diagnosis not present

## 2015-01-07 DIAGNOSIS — M479 Spondylosis, unspecified: Secondary | ICD-10-CM | POA: Diagnosis not present

## 2015-01-07 DIAGNOSIS — R278 Other lack of coordination: Principal | ICD-10-CM | POA: Insufficient documentation

## 2015-01-07 HISTORY — DX: Chronic obstructive pulmonary disease, unspecified: J44.9

## 2015-01-07 HISTORY — DX: Essential (primary) hypertension: I10

## 2015-01-07 HISTORY — DX: Sciatica, unspecified side: M54.30

## 2015-01-07 HISTORY — DX: Heart failure, unspecified: I50.9

## 2015-01-07 HISTORY — DX: Acute and chronic respiratory failure with hypoxia: J96.21

## 2015-01-07 HISTORY — DX: Chronic kidney disease, unspecified: N18.9

## 2015-01-07 HISTORY — DX: Anemia, unspecified: D64.9

## 2015-01-07 HISTORY — DX: Type 2 diabetes mellitus without complications: E11.9

## 2015-01-07 HISTORY — DX: Other lack of coordination: R27.8

## 2015-01-07 LAB — CBC WITH DIFFERENTIAL/PLATELET
BASOS ABS: 0.1 10*3/uL (ref 0–0.1)
Basophils Relative: 1 %
EOS ABS: 0.3 10*3/uL (ref 0–0.7)
EOS PCT: 3 %
HCT: 36.4 % (ref 35.0–47.0)
Hemoglobin: 12.1 g/dL (ref 12.0–16.0)
LYMPHS PCT: 17 %
Lymphs Abs: 1.9 10*3/uL (ref 1.0–3.6)
MCH: 29.6 pg (ref 26.0–34.0)
MCHC: 33.2 g/dL (ref 32.0–36.0)
MCV: 89.2 fL (ref 80.0–100.0)
MONO ABS: 0.7 10*3/uL (ref 0.2–0.9)
Monocytes Relative: 7 %
Neutro Abs: 8 10*3/uL — ABNORMAL HIGH (ref 1.4–6.5)
Neutrophils Relative %: 72 %
PLATELETS: 193 10*3/uL (ref 150–440)
RBC: 4.08 MIL/uL (ref 3.80–5.20)
RDW: 15 % — AB (ref 11.5–14.5)
WBC: 11 10*3/uL (ref 3.6–11.0)

## 2015-01-07 LAB — COMPREHENSIVE METABOLIC PANEL
ALK PHOS: 52 U/L (ref 38–126)
ALT: 11 U/L — AB (ref 14–54)
AST: 18 U/L (ref 15–41)
Albumin: 3.7 g/dL (ref 3.5–5.0)
Anion gap: 8 (ref 5–15)
BILIRUBIN TOTAL: 0.3 mg/dL (ref 0.3–1.2)
BUN: 21 mg/dL — AB (ref 6–20)
CALCIUM: 9.4 mg/dL (ref 8.9–10.3)
CHLORIDE: 97 mmol/L — AB (ref 101–111)
CO2: 36 mmol/L — ABNORMAL HIGH (ref 22–32)
CREATININE: 0.74 mg/dL (ref 0.44–1.00)
Glucose, Bld: 129 mg/dL — ABNORMAL HIGH (ref 65–99)
Potassium: 4.1 mmol/L (ref 3.5–5.1)
Sodium: 141 mmol/L (ref 135–145)
TOTAL PROTEIN: 6.8 g/dL (ref 6.5–8.1)

## 2015-01-07 LAB — URINALYSIS COMPLETE WITH MICROSCOPIC (ARMC ONLY)
BILIRUBIN URINE: NEGATIVE
GLUCOSE, UA: NEGATIVE mg/dL
Hgb urine dipstick: NEGATIVE
Ketones, ur: NEGATIVE mg/dL
Nitrite: POSITIVE — AB
Protein, ur: NEGATIVE mg/dL
SQUAMOUS EPITHELIAL / LPF: NONE SEEN
Specific Gravity, Urine: 1.013 (ref 1.005–1.030)
pH: 5 (ref 5.0–8.0)

## 2015-01-07 LAB — BLOOD GAS, VENOUS
ACID-BASE EXCESS: 11.1 mmol/L — AB (ref 0.0–3.0)
Bicarbonate: 40.7 mEq/L — ABNORMAL HIGH (ref 21.0–28.0)
PATIENT TEMPERATURE: 37
PCO2 VEN: 79 mmHg — AB (ref 44.0–60.0)
PH VEN: 7.32 (ref 7.320–7.430)

## 2015-01-07 LAB — AMMONIA: Ammonia: 21 umol/L (ref 9–35)

## 2015-01-07 LAB — TROPONIN I

## 2015-01-07 MED ORDER — IPRATROPIUM-ALBUTEROL 0.5-2.5 (3) MG/3ML IN SOLN
3.0000 mL | RESPIRATORY_TRACT | Status: DC
  Administered 2015-01-08 (×5): 3 mL via RESPIRATORY_TRACT
  Filled 2015-01-07 (×4): qty 3

## 2015-01-07 MED ORDER — INSULIN ASPART 100 UNIT/ML ~~LOC~~ SOLN
3.0000 [IU] | Freq: Three times a day (TID) | SUBCUTANEOUS | Status: DC
  Administered 2015-01-08 – 2015-01-09 (×4): 3 [IU] via SUBCUTANEOUS
  Filled 2015-01-07 (×4): qty 3

## 2015-01-07 MED ORDER — INSULIN ASPART 100 UNIT/ML ~~LOC~~ SOLN
0.0000 [IU] | Freq: Three times a day (TID) | SUBCUTANEOUS | Status: DC
  Administered 2015-01-08 (×2): 5 [IU] via SUBCUTANEOUS
  Administered 2015-01-08: 3 [IU] via SUBCUTANEOUS
  Administered 2015-01-09: 5 [IU] via SUBCUTANEOUS
  Filled 2015-01-07 (×3): qty 5
  Filled 2015-01-07: qty 3

## 2015-01-07 MED ORDER — LIDOCAINE 5 % EX PTCH
1.0000 | MEDICATED_PATCH | CUTANEOUS | Status: DC
  Administered 2015-01-08 (×2): 1 via TRANSDERMAL
  Filled 2015-01-07 (×4): qty 1

## 2015-01-07 NOTE — ED Notes (Signed)
C/o right leg pain since Monday.  C/o right lower back, hip pain that radiates down leg.  States unable to put pressure on leg.  Head feels "cloudy" since Monday.

## 2015-01-07 NOTE — ED Provider Notes (Signed)
[facility] Emergency Department Provider Note  Time seen: 7:30 PM  I have reviewed the triage vital signs and the nursing notes.   HISTORY  Chief Complaint Leg Pain    HPI Karen [name] is a 69 y.o. female with a past medical history of COPD, CK D, CHF, diabetes, on 3 L oxygen 24/7, presents the emergency department with right leg pain/weakness, and confusion, with extremity shaking. According to the patient and family for the past 2 days the patient has had right leg pain and weakness where her leg will give out randomly. Also bilateral arm shaking, to the point where she cannot text on her phone due to the shaking. States she has never had an issue like this in the past. Denies any chest pain, abdominal pain, headache. Family denies confusion, but states that she will occasionally just stare off and act like she is not listening to them, until they get her attention.    Past Medical History  Diagnosis Date  . Diabetes mellitus without complication ([place])   . Hypertension   . CKD (chronic kidney disease)   . COPD (chronic obstructive pulmonary disease) ([place])   . CHF (congestive heart failure) ([place])   . Anemia     There are no active problems to display for this patient.   No past surgical history on file.  No current outpatient prescriptions on file.  Allergies Ace inhibitors; Gabapentin; and Lisinopril  No family history on file.  Social History Social History  Substance Use Topics  . Smoking status: Former Research scientist (life sciences)  . Smokeless tobacco: Never Used  . Alcohol Use: No    Review of Systems Constitutional: Negative for fever. Cardiovascular: Negative for chest pain. Respiratory: Negative for shortness of breath. Gastrointestinal: Negative for abdominal pain Musculoskeletal: Moderate lower back pain which is somewhat typical for her. Neurological: Negative for . Pain in her right leg with occasional right leg weakness. Denies  numbness 10-point ROS otherwise negative.  ____________________________________________   PHYSICAL EXAM:  VITAL SIGNS: ED Triage Vitals  Enc Vitals Group     BP 01/07/15 1848 139/38 mmHg     Pulse Rate 01/07/15 1848 92     Resp 01/07/15 1848 16     Temp 01/07/15 1848 98.6 F (37 C)     Temp Source 01/07/15 1848 Oral     SpO2 01/07/15 1848 98 %     Weight 01/07/15 1848 237 lb (107.502 kg)     Height 01/07/15 1848 5\' 3"  (1.6 m)     Head Cir --      Peak Flow --      Pain Score 01/07/15 1848 10     Pain Loc --      Pain Edu? --      Excl. in [place]? --     Constitutional: Alert and oriented. Well appearing and in no distress. Eyes: Normal exam ENT   Head: Normocephalic and atraumatic.   Mouth/Throat: Mucous membranes are moist. Cardiovascular: Normal rate, regular rhythm. No murmur Respiratory: Normal respiratory effort without tachypnea nor retractions. Breath sounds are clear.  No wheezes/rales/rhonchi. Decreased air movement bilaterally. Gastrointestinal: Soft and nontender. No distention.   Musculoskeletal: Good range of motion in all extremities. Patient does have jerking movements in bilateral upper extremities, somewhat consistent with asterixis. Neurologic:  Normal speech and language. Patient has equal grip strengths, no pronator drift, no lower extremity drift, 4+/5 motor in all extremities. Skin:  Skin is warm, dry and intact.  Psychiatric: Mood  and affect are normal.   ____________________________________________   RADIOLOGY  CT head shows no acute abnormalities  ____________________________________________   INITIAL IMPRESSION / ASSESSMENT AND PLAN / ED COURSE  Pertinent labs & imaging results that were available during my care of the patient were reviewed by me and considered in my medical decision making (see chart for details).  Patient says the emergency department with vague complaints, but most concerning her decreased mental status, shaking  and upper extremities, and occasional weakness of the right lower extremity. We will check labs including a VBG to rule out hypercarbia, ammonia to rule out hepatic encephalopathy, CT head to help evaluate for CVA. Currently the patient appears overall very well, alert and oriented 4, no complaints besides shaking in her extremities, and occasional weakness in her right lower extremity.  Patient CT and labs if shown largely normal/baseline results. Patient does have an elevated PCO2 on ABG, but given her history of COPD and chronically elevated bicarbonate levels this is likely close to her baseline. Given her continued weakness in her right lower extremity, shaking in upper extremities and the feeling of "cloudy headed", I believe the patient warrants further workup and will be admitted to the hospital for this.   NIH Stroke Scale   Interval: Baseline. Time: 9:01 PM Person Administering Scale: Jaleah Lefevre  Administer stroke scale items in the order listed. Record performance in each category after each subscale exam. Do not go back and change scores. Follow directions provided for each exam technique. Scores should reflect what the patient does, not what the clinician thinks the patient can do. The clinician should record answers while administering the exam and work quickly. Except where indicated, the patient should not be coached (i.e., repeated requests to patient to make a special effort).   1a  Level of consciousness: 0=alert; keenly responsive  1b. LOC questions:  0=Performs both tasks correctly  1c. LOC commands: 0=Performs both tasks correctly  2.  Best Gaze: 0=normal  3.  Visual: 0=No visual loss  4. Facial Palsy: 0=Normal symmetric movement  5a.  Motor left arm: 0=No drift, limb holds 90 (or 45) degrees for full 10 seconds  5b.  Motor right arm: 0=No drift, limb holds 90 (or 45) degrees for full 10 seconds  6a. motor left leg: 0=No drift, limb holds 90 (or 45) degrees for  full 10 seconds  6b  Motor right leg:  0=No drift, limb holds 90 (or 45) degrees for full 10 seconds  7. Limb Ataxia: 2=Present in two limbs  8.  Sensory: 0=Normal; no sensory loss  9. Best Language:  0=No aphasia, normal  10. Dysarthria: 0=Normal  11. Extinction and Inattention: 0=No abnormality  12. Distal motor function: 0=Normal   Total:   2    ____________________________________________   FINAL CLINICAL IMPRESSION(S) / ED DIAGNOSES  Altered mental status Right leg weakness   Harvest Dark, MD 01/07/15 2102

## 2015-01-07 NOTE — H&P (Signed)
[facility] [place] NAME: Tarna Yore    MR#:  [id number]  DATE OF BIRTH:  Jun 09, 1945  DATE OF ADMISSION:  01/07/2015  PRIMARY CARE PHYSICIAN: No primary care provider on file.   REQUESTING/REFERRING PHYSICIAN:   CHIEF COMPLAINT:   Chief Complaint  Patient presents with  . Leg Pain    HISTORY OF PRESENT ILLNESS: Lashonn Blase  is a 69 y.o. female with a known history of obesity,  Diabetes, COPD, CHF, chronic respiratory failure,  on 3 L of oxygen through nasal cannula 24/ 7 who presents to the hospital with complaints of jaundice weakness and hand flapping. I wonder patient as well as family, patient started having back pains approximately a week ago. She was seen by her primary care physician and was initiated on some pain medications. Over the past 2 days. She's been having problems with increasing sleepiness, confusion and was noted to have flapping getting her upper and lower extremities as well as right leg weakness. Patient tells me that her back pain radiates to her right leg and pain medication does not help. On arrival to the hospital patient was noted to have asterixis. Her venous blood gases revealed a PCO2 elevation. Hospitalist services were contacted for admission.   PAST MEDICAL HISTORY:   Past Medical History  Diagnosis Date  . Diabetes mellitus without complication ([place])   . Hypertension   . CKD (chronic kidney disease)   . COPD (chronic obstructive pulmonary disease) ([place])   . CHF (congestive heart failure) ([place])   . Anemia     PAST SURGICAL HISTORY: No past surgical history on file.  SOCIAL HISTORY:  Social History  Substance Use Topics  . Smoking status: Former Research scientist (life sciences)  . Smokeless tobacco: Never Used  . Alcohol Use: No    FAMILY HISTORY: No family history on file.  DRUG ALLERGIES:  Allergies  Allergen Reactions  . Ace Inhibitors   . Gabapentin Hives  . Lisinopril     Review of Systems   Constitutional: Negative for fever, chills, weight loss and malaise/fatigue.  HENT: Positive for congestion.   Eyes: Negative for blurred vision and double vision.  Respiratory: Positive for cough and shortness of breath. Negative for sputum production and wheezing.   Cardiovascular: Negative for chest pain, palpitations, orthopnea, leg swelling and PND.  Gastrointestinal: Negative for nausea, vomiting, abdominal pain, diarrhea, constipation, blood in stool and melena.  Genitourinary: Negative for dysuria, urgency, frequency and hematuria.  Musculoskeletal: Positive for back pain. Negative for falls.  Skin: Negative for rash.  Neurological: Positive for weakness. Negative for dizziness.  Psychiatric/Behavioral: Negative for depression and memory loss. The patient is not nervous/anxious.     MEDICATIONS AT HOME:  Prior to Admission medications   Not on File      PHYSICAL EXAMINATION:   VITAL SIGNS: Blood pressure 139/38, pulse 92, temperature 98.6 F (37 C), temperature source Oral, resp. rate 16, height 5\' 3"  (1.6 m), weight 107.502 kg (237 lb), SpO2 98 %.  GENERAL:  69 y.o.-year-old patient lying in the bed with no acute distress.  EYES: Pupils equal, round, reactive to light and accommodation. No scleral icterus. Extraocular muscles intact.  HEENT: Head atraumatic, normocephalic. Oropharynx and nasopharynx clear.  NECK:  Supple, no jugular venous distention. No thyroid enlargement, no tenderness.  LUNGS: Normal breath sounds bilaterally, no wheezing, rales,rhonchi or crepitation. No use of accessory muscles of respiration.  CARDIOVASCULAR: S1, S2 normal. No murmurs, rubs, or gallops.  ABDOMEN: Soft, nontender, nondistended. Bowel sounds present. No organomegaly or mass.  EXTREMITIES: No pedal edema, cyanosis, or clubbing. Upper as well as lower extremity asterixis, bilateral lower extremity weakness, and ability to maintain lower extremities above the horizontal level NEUROLOGIC:  Cranial nerves II through XII are intact. Muscle strength 4/5 in upper extremities and 3 out of 5 in lower extremities. Sensation intact. Gait not checked. Back pain in lumbar spine on percussion PSYCHIATRIC: The patient is alert and oriented x 3.  SKIN: No obvious rash, lesion, or ulcer.   LABORATORY PANEL:   CBC  Recent Labs Lab 01/07/15 1941  WBC 11.0  HGB 12.1  HCT 36.4  PLT 193  MCV 89.2  MCH 29.6  MCHC 33.2  RDW 15.0*  LYMPHSABS 1.9  MONOABS 0.7  EOSABS 0.3  BASOSABS 0.1   ------------------------------------------------------------------------------------------------------------------  Chemistries   Recent Labs Lab 01/07/15 1941  NA 141  K 4.1  CL 97*  CO2 36*  GLUCOSE 129*  BUN 21*  CREATININE 0.74  CALCIUM 9.4  AST 18  ALT 11*  ALKPHOS 52  BILITOT 0.3   ------------------------------------------------------------------------------------------------------------------  Cardiac Enzymes  Recent Labs Lab 01/07/15 1941  TROPONINI <0.03   ------------------------------------------------------------------------------------------------------------------  RADIOLOGY: Ct Head Wo Contrast  01/07/2015  CLINICAL DATA:  69 year old female with symptoms of head feeling 'cloudy' since Monday EXAM: CT HEAD WITHOUT CONTRAST TECHNIQUE: Contiguous axial images were obtained from the base of the skull through the vertex without intravenous contrast. COMPARISON:  None. FINDINGS: Negative for acute intracranial hemorrhage, acute infarction, mass, mass effect, hydrocephalus or midline shift. Gray-white differentiation is preserved throughout. No acute soft tissue or calvarial abnormality. The globes and orbits are symmetric and unremarkable. Normal aeration of the mastoid air cells and visualized paranasal sinuses. IMPRESSION: Negative head CT. Electronically Signed   By: Jacqulynn Cadet M.D.   On: 01/07/2015 19:49    EKG: Orders placed or performed in visit on 04/02/14   . EKG 12-Lead    IMPRESSION AND PLAN:  Principal Problem:   Asterixis Active Problems:   Acute on chronic respiratory failure with hypoxia and hypercapnia (HCC)   Sciatica   Weakness 1. Asterixis likely due to hypercarbia, place BiPAP on and follow patient clinically 2. Acute on chronic respiratory failure with hypoxia and hypercapnia, likely due to opiates use recently for back pain, continue opiates, place BiPAP keep pulse oximeter the Route 86-92 % 3. Low back pain, get an x-ray, place Lidoderm 4. Diabetes mellitus with hypoglycemia, insulin sliding scale every 6 hours  All the records are reviewed and case discussed with ED provider. Management plans discussed with the patient, family and they are in agreement.  CODE STATUS: Full code   TOTAL critical care TIME TAKING CARE OF THIS PATIENT: 50 minutes.    Theodoro Grist M.D on 01/07/2015 at 9:34 PM  Between 7am to 6pm - Pager - [phone] After 6pm go to [email or web address] - password [email or web address] [facility]  [facility]  Office  [phone]  CC: Primary care physician; No primary care provider on file.

## 2015-01-08 DIAGNOSIS — R278 Other lack of coordination: Secondary | ICD-10-CM | POA: Diagnosis not present

## 2015-01-08 LAB — BASIC METABOLIC PANEL
Anion gap: 4 — ABNORMAL LOW (ref 5–15)
BUN: 19 mg/dL (ref 6–20)
CHLORIDE: 93 mmol/L — AB (ref 101–111)
CO2: 38 mmol/L — ABNORMAL HIGH (ref 22–32)
CREATININE: 0.73 mg/dL (ref 0.44–1.00)
Calcium: 8.8 mg/dL — ABNORMAL LOW (ref 8.9–10.3)
GFR calc Af Amer: >60 mL/min (ref >60)
GFR calc non Af Amer: >60 mL/min (ref >60)
GLUCOSE: 345 mg/dL — AB (ref 65–99)
Potassium: 4.5 mmol/L (ref 3.5–5.1)
SODIUM: 135 mmol/L (ref 135–145)

## 2015-01-08 LAB — GLUCOSE, CAPILLARY
GLUCOSE-CAPILLARY: 186 mg/dL — AB (ref 65–99)
GLUCOSE-CAPILLARY: 269 mg/dL — AB (ref 65–99)
Glucose-Capillary: 220 mg/dL — ABNORMAL HIGH (ref 65–99)
Glucose-Capillary: 281 mg/dL — ABNORMAL HIGH (ref 65–99)
Glucose-Capillary: 340 mg/dL — ABNORMAL HIGH (ref 65–99)
Glucose-Capillary: 309 mg/dL — ABNORMAL HIGH (ref 65–99)

## 2015-01-08 LAB — CBC
HCT: 33.7 % — ABNORMAL LOW (ref 35.0–47.0)
Hemoglobin: 10.9 g/dL — ABNORMAL LOW (ref 12.0–16.0)
MCH: 28.9 pg (ref 26.0–34.0)
MCHC: 32.4 g/dL (ref 32.0–36.0)
MCV: 89.1 fL (ref 80.0–100.0)
PLATELETS: 170 10*3/uL (ref 150–440)
RBC: 3.78 MIL/uL — ABNORMAL LOW (ref 3.80–5.20)
RDW: 15 % — AB (ref 11.5–14.5)
WBC: 9.2 10*3/uL (ref 3.6–11.0)

## 2015-01-08 LAB — HEMOGLOBIN A1C: HEMOGLOBIN A1C: 6.6 % — AB (ref 4.0–6.0)

## 2015-01-08 LAB — TROPONIN I: Troponin I: <0.03 ng/mL (ref <0.031)

## 2015-01-08 MED ORDER — INSULIN ASPART 100 UNIT/ML ~~LOC~~ SOLN
15.0000 [IU] | Freq: Once | SUBCUTANEOUS | Status: AC
  Administered 2015-01-08: 15 [IU] via SUBCUTANEOUS
  Filled 2015-01-08: qty 15

## 2015-01-08 MED ORDER — ONDANSETRON HCL 4 MG PO TABS: 4.0000 mg | ORAL_TABLET | Freq: Four times a day (QID) | ORAL | Status: DC | PRN

## 2015-01-08 MED ORDER — IPRATROPIUM-ALBUTEROL 0.5-2.5 (3) MG/3ML IN SOLN
RESPIRATORY_TRACT | Status: AC
  Filled 2015-01-08: qty 3

## 2015-01-08 MED ORDER — ACETAMINOPHEN 650 MG RE SUPP: 650.0000 mg | Freq: Four times a day (QID) | RECTAL | Status: DC | PRN

## 2015-01-08 MED ORDER — ATORVASTATIN CALCIUM 10 MG PO TABS
10.0000 mg | ORAL_TABLET | Freq: Every day | ORAL | Status: DC
  Administered 2015-01-08: 10 mg via ORAL
  Filled 2015-01-08: qty 1

## 2015-01-08 MED ORDER — SODIUM CHLORIDE 0.9 % IJ SOLN
3.0000 mL | Freq: Two times a day (BID) | INTRAMUSCULAR | Status: DC
  Administered 2015-01-08 (×3): 3 mL via INTRAVENOUS

## 2015-01-08 MED ORDER — IPRATROPIUM-ALBUTEROL 0.5-2.5 (3) MG/3ML IN SOLN
3.0000 mL | Freq: Three times a day (TID) | RESPIRATORY_TRACT | Status: DC
  Administered 2015-01-08 – 2015-01-09 (×2): 3 mL via RESPIRATORY_TRACT
  Filled 2015-01-08 (×2): qty 3

## 2015-01-08 MED ORDER — ENOXAPARIN SODIUM 40 MG/0.4ML ~~LOC~~ SOLN
40.0000 mg | SUBCUTANEOUS | Status: DC
  Administered 2015-01-08 – 2015-01-09 (×2): 40 mg via SUBCUTANEOUS
  Filled 2015-01-08 (×2): qty 0.4

## 2015-01-08 MED ORDER — ONDANSETRON HCL 4 MG/2ML IJ SOLN: 4.0000 mg | Freq: Four times a day (QID) | INTRAMUSCULAR | Status: DC | PRN

## 2015-01-08 MED ORDER — ALBUTEROL SULFATE (2.5 MG/3ML) 0.083% IN NEBU: 2.5000 mg | INHALATION_SOLUTION | Freq: Four times a day (QID) | RESPIRATORY_TRACT | Status: DC

## 2015-01-08 MED ORDER — ACETAMINOPHEN 325 MG PO TABS
650.0000 mg | ORAL_TABLET | Freq: Four times a day (QID) | ORAL | Status: DC | PRN
  Administered 2015-01-09 (×2): 650 mg via ORAL
  Filled 2015-01-08 (×2): qty 2

## 2015-01-08 MED ORDER — ASPIRIN EC 81 MG PO TBEC
81.0000 mg | DELAYED_RELEASE_TABLET | Freq: Every day | ORAL | Status: DC
  Administered 2015-01-09: 81 mg via ORAL
  Filled 2015-01-08: qty 1

## 2015-01-08 MED ORDER — ALBUTEROL SULFATE (2.5 MG/3ML) 0.083% IN NEBU: 2.5000 mg | INHALATION_SOLUTION | Freq: Four times a day (QID) | RESPIRATORY_TRACT | Status: DC | PRN

## 2015-01-08 NOTE — Evaluation (Signed)
Physical Therapy Evaluation Patient Details Name: Karen [name] MRN: [id number] DOB: 02-24-1946 Today's Date: 01/08/2015   History of Present Illness  presented to ER secondary to weakness, hand-flapping and R LE pain; admitted with acute/chronic respiratory failure with hypoxia/hypercapnia and associated axterixis.  Clinical Impression  Upon evaluation, patient alert and oriented; follows all commands and demonstrates good insight/safety awareness.  Bilat UE strength and ROM grossly WFL, mild jerking especially with fatigue.  Bilat LEs 4- to 4/5 with decreased sensory awareness R knee distally (L WFL).  Rates back/R LE pain 8/10; meds received previous to session.  Able to complete bed mobility with mod indep; sit/stand, basic transfers and gait (60') with RW, cga/min assist; stairs x4 with bilat UEs on single rail, cga/min assist +2 (for safety).  Mild tremulousness noted as fatigue increases, but no overt buckling or LOB.  Patient voices that performance today is improved since admission and near baseline for her at home. Does desat to 85% with exertion on 3L supplemental O2, recovering to >91% within 45-60 seconds of seated rest and pursed lip breathing.  RN informed/aware. Would benefit from skilled PT to address above deficits and promote optimal return to PLOF; Recommend transition to [place] upon discharge from acute hospitalization.     Follow Up Recommendations Home health PT;Supervision for mobility/OOB    Equipment Recommendations       Recommendations for Other Services       Precautions / Restrictions Precautions Precautions: Fall Restrictions Weight Bearing Restrictions: No      Mobility  Bed Mobility Overal bed mobility: Modified Independent                Transfers Overall transfer level: Needs assistance Equipment used: Rolling walker (2 wheeled) Transfers: Sit to/from Stand Sit to Stand: Min assist            Ambulation/Gait Ambulation/Gait  assistance: Min guard;Min assist Ambulation Distance (Feet): 60 Feet Assistive device: Rolling walker (2 wheeled)     Gait velocity interpretation: <1.8 ft/sec, indicative of risk for recurrent falls General Gait Details: reciprocal stepping pattern wtih decreased step heigth/length, decreased cadence and overall gait speed; very guarded and tenuous, but no overt buckling or LOB.  Patient able to self-regulate need for rest breaks as needed.  Stairs Stairs: Yes Stairs assistance: +2 physical assistance (for safety) Stair Management: One rail Left Number of Stairs: 4 General stair comments: step to gait pattern, ascending with R LE, descending with L LE; bilat UEs on single rail.  No buckling or LOB  Wheelchair Mobility    Modified Rankin (Stroke Patients Only)       Balance Overall balance assessment: Needs assistance Sitting-balance support: No upper extremity supported;Feet supported Sitting balance-Leahy Scale: Good     Standing balance support: Bilateral upper extremity supported Standing balance-Leahy Scale: Fair                               Pertinent Vitals/Pain Pain Assessment: 0-10 Pain Score: 8  Pain Location: back, R LE Pain Descriptors / Indicators: Aching Pain Intervention(s): Limited activity within patient's tolerance;Monitored during session;Repositioned    Home Living Family/patient expects to be discharged to:: Private residence Living Arrangements: Spouse/significant other;Children Available Help at Discharge: Family Type of Home: House Home Access: Stairs to enter Entrance Stairs-Rails: Left Entrance Stairs-Number of Steps: 4 Home Layout: One level Home Equipment: Environmental consultant - 2 wheels;Cane - single point      Prior Function Level  of Independence: Independent with assistive device(s)         Comments: Baseline ambulatory with SPC, but using RW immediately prior to admission due to increase back/R LE pain.  Denies fall history, but  does endorse frequent LE buckling immediately prior to admission; husband was providing hands-on assist with all mobility.     Hand Dominance        Extremity/Trunk Assessment   Upper Extremity Assessment: Overall WFL for tasks assessed (continues with intermittent jerking of UEs, esp with fatigue)           Lower Extremity Assessment:  (R LE generally 4-/5 throughout with decreased sensory awareness (to light touch) from knee distally (baseline for patient).  L LE grossly 4+/5 throughout, normal sensation.  Bilat LEs intermittently 'jerking' with resistance testing, but no overt bucklin)         Communication   Communication: No difficulties  Cognition Arousal/Alertness: Awake/alert Behavior During Therapy: WFL for tasks assessed/performed Overall Cognitive Status: Within Functional Limits for tasks assessed                      General Comments      Exercises Other Exercises Other Exercises: Toilet transfer, SPT with RW, cga/min assist; sit/stand from [facility] with RW, cga/min assist; standing balance during clothing management/hygiene, cga with RW      Assessment/Plan    PT Assessment Patient needs continued PT services  PT Diagnosis Generalized weakness;Acute pain   PT Problem List Decreased strength;Decreased activity tolerance;Decreased balance;Decreased mobility;Decreased knowledge of use of DME;Cardiopulmonary status limiting activity;Pain;Obesity  PT Treatment Interventions DME instruction;Gait training;Stair training;Functional mobility training;Therapeutic activities;Therapeutic exercise;Balance training;Patient/family education   PT Goals (Current goals can be found in the Care Plan section) Acute Rehab PT Goals Patient Stated Goal: "to get better so I don't fall at home" PT Goal Formulation: With patient Time For Goal Achievement: 01/22/15 Potential to Achieve Goals: Good    Frequency Min 2X/week   Barriers to discharge Decreased caregiver  support      Co-evaluation               End of Session Equipment Utilized During Treatment: Gait belt Activity Tolerance: Patient tolerated treatment well Patient left: in chair;with call bell/phone within reach;with chair alarm set Nurse Communication: Mobility status (O2 response to activity)         Time: 0935-1000 PT Time Calculation (min) (ACUTE ONLY): 25 min   Charges:   PT Evaluation $Initial PT Evaluation Tier I: 1 Procedure PT Treatments $Gait Training: 8-22 mins   PT G Codes:        Simranjit Thayer H. Owens Shark, PT, DPT, NCS 01/08/2015, 10:44 AM [phone]

## 2015-01-08 NOTE — Care Management (Addendum)
Patient presents from home and admitted under observation.  Has chronic home 02.  Patient with chronic pain issues and diabetes.  When she lived in [place] she has home Programmer, applications and aide.  Would be agreeable to have home health at discharge.  Review of agency list- patient says she does not have a preference.  Referral to [facility] for SN PT and Aide.  This patient may meet criteria for medicaid personal care services.  Confirmed contact information.    PCP is Eulogio Bear at [facility].

## 2015-01-08 NOTE — Progress Notes (Signed)
Pt asked for nursing staff to check blood sugar. Blood sugar was 340 despite getting sliding scale and meal coverage insulin. Pt states that she is on insulin pump, Invokana and 2,000 mg of metformin at home. MD Anselm Jungling called about blood sugar. MD ordered to give 15 units of Novolog and to recheck blood sugar in 2 hours. Will continue to monitor.   Karen [name] M

## 2015-01-08 NOTE — Care Management Obs Status (Signed)
[place] NOTIFICATION   Patient Details  Name: Karen [name] MRN: [id number] Date of Birth: [birth year]   Medicare Observation Status Notification Given:  Yes signed the form.  original given to patient and copy placed in the shadow chart    Katrina Stack, RN 01/08/2015, 9:23 AM

## 2015-01-08 NOTE — Progress Notes (Signed)
[facility] NAME: Karen [name]    MR#:  [id number]  DATE OF BIRTH:  10/21/1945  SUBJECTIVE:  CHIEF COMPLAINT:   Chief Complaint  Patient presents with  . Leg Pain    Completely alert and oriented now, husband in room, no more shaking. No SOB,. But have more pain in her leg.    She was given referal to pain clinic by her PMD.  REVIEW OF SYSTEMS:  CONSTITUTIONAL: No fever, fatigue or weakness.  EYES: No blurred or double vision.  EARS, NOSE, AND THROAT: No tinnitus or ear pain.  RESPIRATORY: No cough, shortness of breath, wheezing or hemoptysis.  CARDIOVASCULAR: No chest pain, orthopnea, edema.  GASTROINTESTINAL: No nausea, vomiting, diarrhea or abdominal pain.  GENITOURINARY: No dysuria, hematuria.  ENDOCRINE: No polyuria, nocturia,  HEMATOLOGY: No anemia, easy bruising or bleeding SKIN: No rash or lesion. MUSCULOSKELETAL: No joint pain or arthritis.  Lower back pain. NEUROLOGIC: No tingling, numbness, weakness.  PSYCHIATRY: No anxiety or depression.   ROS  DRUG ALLERGIES:   Allergies  Allergen Reactions  . Ace Inhibitors   . Gabapentin Hives  . Lisinopril     VITALS:  Blood pressure 146/55, pulse 79, temperature 98.7 F (37.1 C), temperature source Oral, resp. rate 18, height 5\' 3"  (1.6 m), weight 102.331 kg (225 lb 9.6 oz), SpO2 93 %.  PHYSICAL EXAMINATION:  GENERAL:  69 y.o.-year-old patient lying in the bed with no acute distress.  EYES: Pupils equal, round, reactive to light and accommodation. No scleral icterus. Extraocular muscles intact.  HEENT: Head atraumatic, normocephalic. Oropharynx and nasopharynx clear.  NECK:  Supple, no jugular venous distention. No thyroid enlargement, no tenderness.  LUNGS: Normal breath sounds bilaterally, no wheezing, rales,rhonchi or crepitation. No use of accessory muscles of respiration.  CARDIOVASCULAR: S1, S2 normal. No murmurs, rubs, or gallops.  ABDOMEN: Soft,  nontender, nondistended. Bowel sounds present. No organomegaly or mass.  EXTREMITIES: No pedal edema, cyanosis, or clubbing. No calf pain. Have pain in lower back- and it gets worse on right leg rising. NEUROLOGIC: Cranial nerves II through XII are intact. Muscle strength 5/5 in all extremities. Sensation intact. Gait not checked.  PSYCHIATRIC: The patient is alert and oriented x 3.  SKIN: No obvious rash, lesion, or ulcer.   Physical Exam LABORATORY PANEL:   CBC  Recent Labs Lab 01/08/15 0902  WBC 9.2  HGB 10.9*  HCT 33.7*  PLT 170   ------------------------------------------------------------------------------------------------------------------  Chemistries   Recent Labs Lab 01/07/15 1941 01/08/15 0902  NA 141 135  K 4.1 4.5  CL 97* 93*  CO2 36* 38*  GLUCOSE 129* 345*  BUN 21* 19  CREATININE 0.74 0.73  CALCIUM 9.4 8.8*  AST 18  --   ALT 11*  --   ALKPHOS 52  --   BILITOT 0.3  --    ------------------------------------------------------------------------------------------------------------------  Cardiac Enzymes  Recent Labs Lab 01/07/15 1941 01/08/15 0902  TROPONINI <0.03 <0.03   ------------------------------------------------------------------------------------------------------------------  RADIOLOGY:  Dg Lumbar Spine 2-3 Views  01/07/2015  CLINICAL DATA:  Chronic low back pain, radiating to RIGHT leg for 4 days. History of diabetes, hypertension, obesity. EXAM: LUMBAR SPINE - 2-3 VIEW COMPARISON:  CT abdomen and pelvis April 02, 2014 FINDINGS: Lumbar vertebral bodies appear intact. Grade 1 L4-5 anterolisthesis. Moderate to severe L5-S1 disc height loss is unchanged from associated with endplate spurring. Mild to moderate at L4-5. Moderate lower lumbar facet arthropathy. Bone mineral density is decreased without destructive  bony lesions. Sacroiliac joints are symmetric. Phleboliths in the LEFT pelvis, present on prior CT. Surgical clips in the included  right abdomen compatible with cholecystectomy. Probable RIGHT nephrolithiasis. IMPRESSION: No acute fracture deformity. Grade 1 L4-5 anterolisthesis, stable from prior imaging. Probable RIGHT nephrolithiasis, incompletely evaluated. Electronically Signed   By: Elon Alas M.D.   On: 01/07/2015 22:27   Ct Head Wo Contrast  01/07/2015  CLINICAL DATA:  68 year old female with symptoms of head feeling 'cloudy' since Monday EXAM: CT HEAD WITHOUT CONTRAST TECHNIQUE: Contiguous axial images were obtained from the base of the skull through the vertex without intravenous contrast. COMPARISON:  None. FINDINGS: Negative for acute intracranial hemorrhage, acute infarction, mass, mass effect, hydrocephalus or midline shift. Gray-white differentiation is preserved throughout. No acute soft tissue or calvarial abnormality. The globes and orbits are symmetric and unremarkable. Normal aeration of the mastoid air cells and visualized paranasal sinuses. IMPRESSION: Negative head CT. Electronically Signed   By: Jacqulynn Cadet M.D.   On: 01/07/2015 19:49    ASSESSMENT AND PLAN:   Principal Problem:   Asterixis Active Problems:   Acute on chronic respiratory failure with hypoxia and hypercapnia (HCC)   Sciatica   Weakness  1. Asterixis likely due to hypercarbia, placed on BiPAP , now pt is doing better, completely alert.  2. Acute on chronic respiratory failure with hypoxia and hypercapnia, likely due to opiates use recently for back pain, continue opiates, placed on BiPAP keep pulse oximeter the Route 86-92 %    Improved now. 3. Low back pain, get an x-ray, place Lidoderm. Xray some arthritis, may heve nerve root compression, caleld ortho consult. 4. Diabetes mellitus with hypoglycemia, insulin sliding scale every 6 hours   All the records are reviewed and case discussed with Care Management/Social Workerr. Management plans discussed with the patient, family and they are in agreement.  CODE STATUS:  full  TOTAL TIME TAKING CARE OF THIS PATIENT: 35 minutes.  Husband in room- discussed with him.  POSSIBLE D/C tomorrow. DEPENDING ON CLINICAL CONDITION.   Vaughan Basta M.D on 01/08/2015   Between 7am to 6pm - Pager - [phone]  After 6pm go to [email or web address] - password [email or web address] [facility]  [facility]  Office  [phone]  CC: Primary care physician; No primary care provider on file.  Note: This dictation was prepared with Dragon dictation along with smaller phrase technology. Any transcriptional errors that result from this process are unintentional.

## 2015-01-08 NOTE — Progress Notes (Signed)
Inpatient Diabetes Program Recommendations  AACE/ADA: New Consensus Statement on Inpatient Glycemic Control (2015)  Target Ranges:  Prepandial:   less than 140 mg/dL      Peak postprandial:   less than 180 mg/dL (1-2 hours)      Critically ill patients:  140 - 180 mg/dL   Review of Glycemic Control  Results for Karen, [name] (MRN [id number]) as of 01/08/2015 13:20  Ref. Range 01/08/2015 00:21 01/08/2015 07:28 01/08/2015 09:02 01/08/2015 11:39  Glucose-Capillary Latest Ref Range: 65-99 mg/dL 186 (H) 220 (H)  281 (H)   Diabetes history: Type 2, A1C pending on 01/07/15 Outpatient Diabetes medications: Invokana 187m/day, Metformin 5071mbid, V-Go 40 insulin delivery system= Novolog 40 units "basal" insulin, 6 units Novolog correction bolus and 4-8 units bolus meal coverage tid- depending on her meal size.   Current orders for Inpatient glycemic control:Novolog 0-9 units tid, Novolog 3 units tid  Inpatient Diabetes Program Recommendations:  I met the patient and her significant other at her bedside- she clarifies her insulin delivery.  She does not take Novolog 15-25 units tid, she uses the V-GO delivery system but has removed her V-Go while she is in the hospital.  She sees Dr. PaEddie Dibblesor diabetes management. Last A1C on 12/21/14 at her office was 7.0%  Patient uses the V-Go delivery system for insulin delivery- therefore it functions in the following manner-   "V-Go 40 is indicated for continuous subcutaneous infusion of 40 Units of insulin in one 24-hour time period (1.67 U/hr) and on-demand bolus dosing in 2-Unit increments (up to 36 Units per one 24-hour time period) in adult patients requiring insulin."    Please consider ordering Lantus insulin 20 units qday (1/2 daily dose of basal at home), and increasing the meal coverage insulin to Novolog 5 units tid (hold if she eats less than 50%).  Continue correction insulin as ordered.     JuGentry FitzRN, BA, MHA, CDE Diabetes  Coordinator Inpatient Diabetes Program  [phone] Pager) [phone]/06/2014 1:57 PM

## 2015-01-08 NOTE — Progress Notes (Signed)
Received shift report from Mya at 1700. Patient is in bed, calm eating supper with no complaints of pain.

## 2015-01-09 DIAGNOSIS — R278 Other lack of coordination: Secondary | ICD-10-CM | POA: Diagnosis not present

## 2015-01-09 LAB — GLUCOSE, CAPILLARY
Glucose-Capillary: 176 mg/dL — ABNORMAL HIGH (ref 65–99)
Glucose-Capillary: 273 mg/dL — ABNORMAL HIGH (ref 65–99)

## 2015-01-09 NOTE — Consult Note (Signed)
ORTHOPAEDIC CONSULTATION  REQUESTING PHYSICIAN: Demetrios Loll, MD  Chief Complaint: Chronic low back pain  HPI: Karen [name] is a 69 y.o. female who complains of  Chronic low back pain. She states she has had back pain for years. She was treated with an epidural steroid injection years ago, but cannot remember exactly when. She denies any bowel/bladder dysfunction, and denies any saddle anesthesia. She does report occasional radicular pain down her right leg. She states her pain has been improving with a lidoderm patch  Past Medical History  Diagnosis Date  . Diabetes mellitus without complication ([place])   . Hypertension   . CKD (chronic kidney disease)   . COPD (chronic obstructive pulmonary disease) ([place])   . CHF (congestive heart failure) ([name])   . Anemia    History reviewed. No pertinent past surgical history. Social History   Social History  . Marital Status: Married    Spouse Name: N/A  . Number of Children: N/A  . Years of Education: N/A   Social History Main Topics  . Smoking status: Former Research scientist (life sciences)  . Smokeless tobacco: Never Used  . Alcohol Use: No  . Drug Use: No  . Sexual Activity: Not Asked   Other Topics Concern  . None   Social History Narrative   History reviewed. No pertinent family history. Allergies  Allergen Reactions  . Ace Inhibitors   . Gabapentin Hives  . Lisinopril    Prior to Admission medications   Medication Sig Start Date End Date Taking? Authorizing Provider  aspirin EC 81 MG tablet Take 1 tablet by mouth daily.   Yes Historical Provider, MD  atorvastatin (LIPITOR) 10 MG tablet Take 1 tablet by mouth daily.   Yes Historical Provider, MD  canagliflozin (INVOKANA) 100 MG TABS tablet Take 1 tablet by mouth daily.   Yes Historical Provider, MD  ipratropium-albuterol (DUONEB) 0.5-2.5 (3) MG/3ML SOLN Take 3 mLs by nebulization every 6 (six) hours as needed (wheezing).   Yes Historical Provider, MD  losartan (COZAAR) 50 MG tablet Take 50 mg  by mouth daily.   Yes Historical Provider, MD  metFORMIN (GLUCOPHAGE) 500 MG tablet Take 500 mg by mouth 2 (two) times daily with a meal.   Yes Historical Provider, MD  montelukast (SINGULAIR) 10 MG tablet Take 10 mg by mouth at bedtime.   Yes Historical Provider, MD  NOVOLOG 100 UNIT/ML injection Inject 15-25 Units into the skin 3 (three) times daily with meals.   Yes Historical Provider, MD  oxyCODONE-acetaminophen (PERCOCET/ROXICET) 5-325 MG tablet Take 1 tablet by mouth 2 (two) times daily as needed for moderate pain or severe pain.   Yes Historical Provider, MD  pregabalin (LYRICA) 150 MG capsule Take 150 mg by mouth 2 (two) times daily.   Yes Historical Provider, MD   Dg Lumbar Spine 2-3 Views  01/07/2015  CLINICAL DATA:  Chronic low back pain, radiating to RIGHT leg for 4 days. History of diabetes, hypertension, obesity. EXAM: LUMBAR SPINE - 2-3 VIEW COMPARISON:  CT abdomen and pelvis April 02, 2014 FINDINGS: Lumbar vertebral bodies appear intact. Grade 1 L4-5 anterolisthesis. Moderate to severe L5-S1 disc height loss is unchanged from associated with endplate spurring. Mild to moderate at L4-5. Moderate lower lumbar facet arthropathy. Bone mineral density is decreased without destructive bony lesions. Sacroiliac joints are symmetric. Phleboliths in the LEFT pelvis, present on prior CT. Surgical clips in the included right abdomen compatible with cholecystectomy. Probable RIGHT nephrolithiasis. IMPRESSION: No acute fracture deformity. Grade 1 L4-5 anterolisthesis, stable  from prior imaging. Probable RIGHT nephrolithiasis, incompletely evaluated. Electronically Signed   By: Elon Alas M.D.   On: 01/07/2015 22:27   Ct Head Wo Contrast  01/07/2015  CLINICAL DATA:  69 year old female with symptoms of head feeling 'cloudy' since Monday EXAM: CT HEAD WITHOUT CONTRAST TECHNIQUE: Contiguous axial images were obtained from the base of the skull through the vertex without intravenous contrast.  COMPARISON:  None. FINDINGS: Negative for acute intracranial hemorrhage, acute infarction, mass, mass effect, hydrocephalus or midline shift. Gray-white differentiation is preserved throughout. No acute soft tissue or calvarial abnormality. The globes and orbits are symmetric and unremarkable. Normal aeration of the mastoid air cells and visualized paranasal sinuses. IMPRESSION: Negative head CT. Electronically Signed   By: Jacqulynn Cadet M.D.   On: 01/07/2015 19:49    Positive ROS: All other systems have been reviewed and were otherwise negative with the exception of those mentioned in the HPI and as above.  Physical Exam: General: Alert, no acute distress Cardiovascular: No pedal edema Respiratory: No cyanosis, no use of accessory musculature GI: No organomegaly, abdomen is soft and non-tender Skin: No lesions in the area of chief complaint Neurologic: Sensation intact distally Psychiatric: Patient is competent for consent with normal mood and affect Lymphatic: No axillary or cervical lymphadenopathy  MUSCULOSKELETAL: Mild paraspinal tenderness, but no tenderness or step-off with direct palpation of her spinous processes. 5/5 strength bilaterally to HF,KF,KE, ankle DF and PF. Sensation intact to light touch in all nerve distributions. Symmetric patellar and achilles DTRs, no clonus, and negative babinski signs bilaterally.  Assessment/Plan: Chronic low back pain with stable L4-5 anterolisthesis and facet arthropathy with clinical improvement. If symptoms worsen or patient develops any neurologic symptoms, Would consider an MRI and possible selective nerve root injection, but as her neurologic exam is normal and currently her pain is improving, I would continue conservative management and physical therapy. She can follow-up as an outpatient for her low back pain.         01/09/2015 7:44 AM

## 2015-01-09 NOTE — Discharge Summary (Signed)
[facility] NAME: Karen [name]    MR#:  [id number]  DATE OF BIRTH:  Mar 19, 1945  DATE OF ADMISSION:  01/07/2015 ADMITTING PHYSICIAN: Theodoro Grist, MD  DATE OF DISCHARGE: 01/09/2015 10:35 AM  PRIMARY CARE PHYSICIAN: WHITE, Orlene Och, NP    ADMISSION DIAGNOSIS:  Right sided sciatica [M54.31] Right leg weakness [R29.898] Altered mental status, unspecified altered mental status type [R41.82]   DISCHARGE DIAGNOSIS:  Asterixis likely due to hypercarnia Acute on chronic respiratory failure with hypoxia and hypercapnia SECONDARY DIAGNOSIS:   Past Medical History  Diagnosis Date  . Diabetes mellitus without complication ([place])   . Hypertension   . CKD (chronic kidney disease)   . COPD (chronic obstructive pulmonary disease) ([place])   . CHF (congestive heart failure) ([place])   . Anemia     HOSPITAL COURSE:   1. Asterixis likely due to hypercarbia, was placed on BiPAP. Improved and off BIPAP.  2. Acute on chronic respiratory failure with hypoxia and hypercapnia, likely due to opiates use recently for back pain, continue opiates, placed on BiPAP keep pulse oximeter the Route 86-92 %  Improved. 3. Low back pain, continue conservative management and physical therapy per ortho consult. 4. Diabetes mellitus with hypoglycemia, insulin sliding scale every 6 hours  DISCHARGE CONDITIONS:   Stable, discharged to home with home health and PT today.  CONSULTS OBTAINED:  Treatment Team:  Lytle Butte, MD Earnestine Leys, MD  DRUG ALLERGIES:   Allergies  Allergen Reactions  . Ace Inhibitors   . Gabapentin Hives  . Lisinopril     DISCHARGE MEDICATIONS:   Discharge Medication List as of 01/09/2015 10:09 AM    CONTINUE these medications which have NOT CHANGED   Details  aspirin EC 81 MG tablet Take 1 tablet by mouth daily., Until Discontinued, Historical Med    atorvastatin (LIPITOR) 10 MG tablet Take 1 tablet by  mouth daily., Until Discontinued, Historical Med    canagliflozin (INVOKANA) 100 MG TABS tablet Take 1 tablet by mouth daily., Until Discontinued, Historical Med    ipratropium-albuterol (DUONEB) 0.5-2.5 (3) MG/3ML SOLN Take 3 mLs by nebulization every 6 (six) hours as needed (wheezing)., Until Discontinued, Historical Med    losartan (COZAAR) 50 MG tablet Take 50 mg by mouth daily., Until Discontinued, Historical Med    metFORMIN (GLUCOPHAGE) 500 MG tablet Take 500 mg by mouth 2 (two) times daily with a meal., Until Discontinued, Historical Med    montelukast (SINGULAIR) 10 MG tablet Take 10 mg by mouth at bedtime., Until Discontinued, Historical Med    NOVOLOG 100 UNIT/ML injection Inject 15-25 Units into the skin 3 (three) times daily with meals., Until Discontinued, Historical Med    oxyCODONE-acetaminophen (PERCOCET/ROXICET) 5-325 MG tablet Take 1 tablet by mouth 2 (two) times daily as needed for moderate pain or severe pain., Until Discontinued, Historical Med    pregabalin (LYRICA) 150 MG capsule Take 150 mg by mouth 2 (two) times daily., Until Discontinued, Historical Med         DISCHARGE INSTRUCTIONS:    If you experience worsening of your admission symptoms, develop shortness of breath, life threatening emergency, suicidal or homicidal thoughts you must seek medical attention immediately by calling 911 or calling your MD immediately  if symptoms less severe.  You Must read complete instructions/literature along with all the possible adverse reactions/side effects for all the Medicines you take and that have been prescribed to you. Take any new Medicines after you  have completely understood and accept all the possible adverse reactions/side effects.   Please note  You were cared for by a hospitalist during your hospital stay. If you have any questions about your discharge medications or the care you received while you were in the hospital after you are discharged, you can  call the unit and asked to speak with the hospitalist on call if the hospitalist that took care of you is not available. Once you are discharged, your primary care physician will handle any further medical issues. Please note that NO REFILLS for any discharge medications will be authorized once you are discharged, as it is imperative that you return to your primary care physician (or establish a relationship with a primary care physician if you do not have one) for your aftercare needs so that they can reassess your need for medications and monitor your lab values.    Today   SUBJECTIVE   No complaint.   VITAL SIGNS:  Blood pressure 161/71, pulse 86, temperature 98.4 F (36.9 C), temperature source Oral, resp. rate 20, height 5\' 3"  (1.6 m), weight 102.331 kg (225 lb 9.6 oz), SpO2 96 %.  I/O:   Intake/Output Summary (Last 24 hours) at 01/09/15 1858 Last data filed at 01/09/15 [id number]  Gross per 24 hour  Intake      0 ml  Output   1200 ml  Net  -1200 ml    PHYSICAL EXAMINATION:  GENERAL:  69 y.o.-year-old patient lying in the bed with no acute distress.  EYES: Pupils equal, round, reactive to light and accommodation. No scleral icterus. Extraocular muscles intact.  HEENT: Head atraumatic, normocephalic. Oropharynx and nasopharynx clear.  NECK:  Supple, no jugular venous distention. No thyroid enlargement, no tenderness.  LUNGS: Normal breath sounds bilaterally, no wheezing, rales,rhonchi or crepitation. No use of accessory muscles of respiration.  CARDIOVASCULAR: S1, S2 normal. No murmurs, rubs, or gallops.  ABDOMEN: Soft, non-tender, non-distended. Bowel sounds present. No organomegaly or mass.  EXTREMITIES: No pedal edema, cyanosis, or clubbing.  NEUROLOGIC: Cranial nerves II through XII are intact. Muscle strength 5/5 in all extremities. Sensation intact. Gait not checked.  PSYCHIATRIC: The patient is alert and oriented x 3.  SKIN: No obvious rash, lesion, or ulcer.   DATA REVIEW:    CBC  Recent Labs Lab 01/08/15 0902  WBC 9.2  HGB 10.9*  HCT 33.7*  PLT 170    Chemistries   Recent Labs Lab 01/07/15 1941 01/08/15 0902  NA 141 135  K 4.1 4.5  CL 97* 93*  CO2 36* 38*  GLUCOSE 129* 345*  BUN 21* 19  CREATININE 0.74 0.73  CALCIUM 9.4 8.8*  AST 18  --   ALT 11*  --   ALKPHOS 52  --   BILITOT 0.3  --     Cardiac Enzymes  Recent Labs Lab 01/08/15 0902  TROPONINI <0.03    Microbiology Results  Results for orders placed or performed in visit on 04/02/14  Urine culture     Status: None   Collection Time: 04/02/14  5:17 AM  Result Value Ref Range Status   Micro Text Report   Final       SOURCE: IN AND OUT CATH    ORGANISM 1                >100,000 CFU/ML Enterobacter cloacae ssp cloacae   COMMENT                   -  ANTIBIOTIC                    ORG#1    ORG#2     CEFAZOLIN                     R        R         CEFOXITIN                     R        R         CEFTRIAXONE                   S        S         CIPROFLOXACIN                 S        S         GENTAMICIN                    S        S         IMIPENEM                      S        S         LEVOFLOXACIN                  S        S         NITROFURANTOIN                I        I         Trimethoprim/Sulfamethoxazole S        S           Urine culture     Status: None   Collection Time: 04/02/14 10:53 PM  Result Value Ref Range Status   Micro Text Report   Final       SOURCE: CLEAN CATCH    ORGANISM 1                60,000 CFU/ML Enterobacter cloacae ssp cloacae   COMMENT                   WITH MIXED-BACTERIAL-ORGANISMS   ANTIBIOTIC                    ORG#1     CEFAZOLIN                     R         CEFOXITIN                     R         CEFTRIAXONE                   S         CIPROFLOXACIN                 S         GENTAMICIN                    S         IMIPENEM  S         LEVOFLOXACIN                  S         NITROFURANTOIN                 S         Trimethoprim/Sulfamethoxazole S             RADIOLOGY:  Dg Lumbar Spine 2-3 Views  01/07/2015  CLINICAL DATA:  Chronic low back pain, radiating to RIGHT leg for 4 days. History of diabetes, hypertension, obesity. EXAM: LUMBAR SPINE - 2-3 VIEW COMPARISON:  CT abdomen and pelvis April 02, 2014 FINDINGS: Lumbar vertebral bodies appear intact. Grade 1 L4-5 anterolisthesis. Moderate to severe L5-S1 disc height loss is unchanged from associated with endplate spurring. Mild to moderate at L4-5. Moderate lower lumbar facet arthropathy. Bone mineral density is decreased without destructive bony lesions. Sacroiliac joints are symmetric. Phleboliths in the LEFT pelvis, present on prior CT. Surgical clips in the included right abdomen compatible with cholecystectomy. Probable RIGHT nephrolithiasis. IMPRESSION: No acute fracture deformity. Grade 1 L4-5 anterolisthesis, stable from prior imaging. Probable RIGHT nephrolithiasis, incompletely evaluated. Electronically Signed   By: Elon Alas M.D.   On: 01/07/2015 22:27   Ct Head Wo Contrast  01/07/2015  CLINICAL DATA:  69 year old female with symptoms of head feeling 'cloudy' since Monday EXAM: CT HEAD WITHOUT CONTRAST TECHNIQUE: Contiguous axial images were obtained from the base of the skull through the vertex without intravenous contrast. COMPARISON:  None. FINDINGS: Negative for acute intracranial hemorrhage, acute infarction, mass, mass effect, hydrocephalus or midline shift. Gray-white differentiation is preserved throughout. No acute soft tissue or calvarial abnormality. The globes and orbits are symmetric and unremarkable. Normal aeration of the mastoid air cells and visualized paranasal sinuses. IMPRESSION: Negative head CT. Electronically Signed   By: Jacqulynn Cadet M.D.   On: 01/07/2015 19:49        Management plans discussed with the patient, family and they are in agreement.  CODE STATUS: Full code  TOTAL TIME TAKING CARE  OF THIS PATIENT: 36 minutes.    Demetrios Loll M.D on 01/09/2015 at 6:58 PM  Between 7am to 6pm - Pager - [phone]  After 6pm go to [email or web address] - password [email or web address]  Office  [phone]  CC: Primary care physician; WHITE, Orlene Och, NP

## 2015-01-09 NOTE — Progress Notes (Signed)
Discharge instructions explained to pt and pts spouse/ verbalized an understanding/ iv and tele removed/ transported off unit via wheelchair.  

## 2015-01-09 NOTE — Discharge Instructions (Signed)
Heart healthy and ADA diet. Activity as tolerated. Continue home O2 [place] 3L.

## 2015-01-09 NOTE — Care Management Note (Signed)
Case Management Note  Patient Details  Name: Trinda Milonas MRN: [id number] Date of Birth: [birth year]  Subjective/Objective:    Call to Rhett Bannister at [facility] requesting home health RN, PT, nurse aid. Ms Lemar Livings reports that she can retrieve all necessary discharge information from [facility] so nothing needs to be faxed to [facility]. Ms Lemar Livings has the weekend case managers phone number if she requires additional information. Ms Bakker was discharged home today.                 Action/Plan:   Expected Discharge Date:                  Expected Discharge Plan:     In-House Referral:     Discharge planning Services     Post Acute Care Choice:    Choice offered to:     DME Arranged:    DME Agency:     HH Arranged:    [place] Agency:     Status of Service:     Medicare Important Message Given:    Date Medicare IM Given:    Medicare IM give by:    Date Additional Medicare IM Given:    Additional Medicare Important Message give by:     If discussed at [place] of Stay Meetings, dates discussed:    Additional Comments:  Levester Waldridge A, RN 01/09/2015, 1:13 PM

## 2015-01-18 ENCOUNTER — Other Ambulatory Visit: Payer: Self-pay | Admitting: Orthopedic Surgery

## 2015-01-18 DIAGNOSIS — M5441 Lumbago with sciatica, right side: Secondary | ICD-10-CM

## 2015-01-18 DIAGNOSIS — M4807 Spinal stenosis, lumbosacral region: Secondary | ICD-10-CM

## 2015-02-04 ENCOUNTER — Ambulatory Visit
Admission: RE | Admit: 2015-02-04 | Discharge: 2015-02-04 | Disposition: A | Discharge status: Home / Self Care | Payer: Medicare Other | Source: Ambulatory Visit | Attending: Orthopedic Surgery | Admitting: Orthopedic Surgery

## 2015-02-04 DIAGNOSIS — M4806 Spinal stenosis, lumbar region: Secondary | ICD-10-CM | POA: Diagnosis not present

## 2015-02-04 DIAGNOSIS — M5441 Lumbago with sciatica, right side: Secondary | ICD-10-CM | POA: Diagnosis present

## 2015-02-04 DIAGNOSIS — M5126 Other intervertebral disc displacement, lumbar region: Secondary | ICD-10-CM | POA: Insufficient documentation

## 2015-02-04 DIAGNOSIS — M4807 Spinal stenosis, lumbosacral region: Secondary | ICD-10-CM

## 2015-03-07 DIAGNOSIS — I5030 Unspecified diastolic (congestive) heart failure: Secondary | ICD-10-CM

## 2015-03-07 HISTORY — DX: Unspecified diastolic (congestive) heart failure: I50.30

## 2015-08-17 ENCOUNTER — Encounter: Payer: Self-pay | Admitting: Emergency Medicine

## 2015-08-17 ENCOUNTER — Ambulatory Visit
Admission: EM | Admit: 2015-08-17 | Discharge: 2015-08-17 | Disposition: A | Discharge status: Home / Self Care | Payer: Medicare Other | Attending: Internal Medicine | Admitting: Internal Medicine

## 2015-08-17 DIAGNOSIS — R21 Rash and other nonspecific skin eruption: Secondary | ICD-10-CM | POA: Diagnosis not present

## 2015-08-17 MED ORDER — HYDROCORTISONE 1 % EX CREA: TOPICAL_CREAM | CUTANEOUS | Status: DC

## 2015-08-17 NOTE — Discharge Instructions (Signed)

## 2015-08-17 NOTE — ED Provider Notes (Signed)
CSN: [id number]     Arrival date & time 08/17/15  1142 History   First MD Initiated Contact with Patient 08/17/15 1239     Chief Complaint  Patient presents with  . Insect Bite   (Consider location/radiation/quality/duration/timing/severity/associated sxs/prior Treatment) HPI Comments: This is a 70 year old female with past medical history of COPD who presents to clinic complaining of what she thinks is an insect bite on her back. She does not visualize any insect that may have bitten her. It has been there for a little more than 24 hours. She is concerned because she usually has some airway swelling with bites and stings area distally she has not experienced any breathing difficulty beyond her usual supplemental oxygen requirement. The history is provided by the patient.    Past Medical History  Diagnosis Date  . Diabetes mellitus without complication ([place])   . Hypertension   . CKD (chronic kidney disease)   . COPD (chronic obstructive pulmonary disease) ([place])   . CHF (congestive heart failure) ([place])   . Anemia    Past Surgical History  Procedure Laterality Date  . Eye surgery    . Cholecystectomy    . Cesarean section     Family History  Problem Relation Age of Onset  . Family history unknown: Yes   Social History  Substance Use Topics  . Smoking status: Former Research scientist (life sciences)  . Smokeless tobacco: Never Used  . Alcohol Use: No   OB History    No data available     Review of Systems  Skin: Positive for rash.  All other systems reviewed and are negative.   Allergies  Ace inhibitors; Gabapentin; Lisinopril; and Lyrica  Home Medications   Prior to Admission medications   Medication Sig Start Date End Date Taking? Authorizing Provider  aspirin EC 81 MG tablet Take 1 tablet by mouth daily.   Yes Historical Provider, MD  atorvastatin (LIPITOR) 10 MG tablet Take 1 tablet by mouth daily.   Yes Historical Provider, MD  canagliflozin (INVOKANA) 100 MG TABS tablet Take 1 tablet  by mouth daily.   Yes Historical Provider, MD  ipratropium-albuterol (DUONEB) 0.5-2.5 (3) MG/3ML SOLN Take 3 mLs by nebulization every 6 (six) hours as needed (wheezing).   Yes Historical Provider, MD  losartan (COZAAR) 50 MG tablet Take 50 mg by mouth daily.   Yes Historical Provider, MD  metFORMIN (GLUCOPHAGE) 500 MG tablet Take 500 mg by mouth 2 (two) times daily with a meal.   Yes Historical Provider, MD  montelukast (SINGULAIR) 10 MG tablet Take 10 mg by mouth at bedtime.   Yes Historical Provider, MD  NOVOLOG 100 UNIT/ML injection Inject 15-25 Units into the skin 3 (three) times daily with meals.   Yes Historical Provider, MD  oxyCODONE-acetaminophen (PERCOCET/ROXICET) 5-325 MG tablet Take 1 tablet by mouth 2 (two) times daily as needed for moderate pain or severe pain.    Historical Provider, MD  pregabalin (LYRICA) 150 MG capsule Take 150 mg by mouth 2 (two) times daily.    Historical Provider, MD   Meds Ordered and Administered this Visit  Medications - No data to display  Pulse 90  Temp(Src) 98 F (36.7 C) (Oral)  Resp 20  Wt 223 lb (101.152 kg)  SpO2 87% No data found.   Physical Exam  Constitutional: She is oriented to person, place, and time. She appears well-developed and well-nourished. No distress.  HENT:  Head: Normocephalic and atraumatic.  Mouth/Throat: Oropharynx is clear and moist.  Eyes: Conjunctivae  and EOM are normal. Pupils are equal, round, and reactive to light. No scleral icterus.  Neck: Normal range of motion. Neck supple. No JVD present. No tracheal deviation present. No thyromegaly present.  Cardiovascular: Normal rate, regular rhythm and normal heart sounds.  Exam reveals no gallop and no friction rub.   No murmur heard. Pulmonary/Chest: Effort normal and breath sounds normal.  Abdominal: Soft. Bowel sounds are normal. She exhibits no distension. There is no tenderness.  Genitourinary:  Deferred  Musculoskeletal: Normal range of motion. She exhibits  no edema.  Lymphadenopathy:    She has no cervical adenopathy.  Neurological: She is alert and oriented to person, place, and time. No cranial nerve deficit.  Skin: Skin is warm and dry. Rash noted.  Sporadic areas of erythema and mild mapping pruritic areas central upper back  Psychiatric: She has a normal mood and affect. Her behavior is normal. Judgment and thought content normal.  Nursing note and vitals reviewed.   ED Course  Procedures (including critical care time)  Labs Review Labs Reviewed - No data to display  Imaging Review No results found.   Visual Acuity Review  Right Eye Distance:   Left Eye Distance:   Bilateral Distance:    Right Eye Near:   Left Eye Near:    Bilateral Near:         MDM  No diagnosis found. Puritic area does not appear to be an insect bite. I believe it is a rash and will prescribe hydrocortisone for symptomatic relief.    Harrie Foreman, MD 08/17/15 [phone]

## 2015-08-17 NOTE — ED Notes (Signed)
Patient states she has a bite on her back which she noticed yesterday

## 2015-09-15 ENCOUNTER — Other Ambulatory Visit: Payer: Self-pay | Admitting: Family Medicine

## 2015-09-15 DIAGNOSIS — R519 Headache, unspecified: Secondary | ICD-10-CM

## 2015-09-15 DIAGNOSIS — R51 Headache: Principal | ICD-10-CM

## 2015-09-29 ENCOUNTER — Ambulatory Visit
Admission: RE | Admit: 2015-09-29 | Discharge: 2015-09-29 | Disposition: A | Discharge status: Home / Self Care | Payer: Medicare Other | Source: Ambulatory Visit | Attending: Family Medicine | Admitting: Family Medicine

## 2015-09-29 DIAGNOSIS — R9089 Other abnormal findings on diagnostic imaging of central nervous system: Secondary | ICD-10-CM | POA: Diagnosis not present

## 2015-09-29 DIAGNOSIS — G8929 Other chronic pain: Secondary | ICD-10-CM

## 2015-09-29 DIAGNOSIS — R51 Headache: Secondary | ICD-10-CM | POA: Diagnosis present

## 2015-10-05 ENCOUNTER — Ambulatory Visit: Payer: Medicare Other | Admitting: Internal Medicine

## 2015-10-12 ENCOUNTER — Ambulatory Visit: Payer: Medicare Other | Admitting: Internal Medicine

## 2015-10-19 ENCOUNTER — Encounter: Payer: Self-pay | Admitting: Internal Medicine

## 2015-10-19 ENCOUNTER — Inpatient Hospital Stay: Payer: Medicare Other

## 2015-10-19 ENCOUNTER — Inpatient Hospital Stay: Payer: Medicare Other | Attending: Internal Medicine | Admitting: Internal Medicine

## 2015-10-19 DIAGNOSIS — Z7984 Long term (current) use of oral hypoglycemic drugs: Secondary | ICD-10-CM | POA: Diagnosis not present

## 2015-10-19 DIAGNOSIS — D72829 Elevated white blood cell count, unspecified: Secondary | ICD-10-CM

## 2015-10-19 DIAGNOSIS — J449 Chronic obstructive pulmonary disease, unspecified: Secondary | ICD-10-CM

## 2015-10-19 DIAGNOSIS — Z79899 Other long term (current) drug therapy: Secondary | ICD-10-CM | POA: Diagnosis not present

## 2015-10-19 DIAGNOSIS — N189 Chronic kidney disease, unspecified: Secondary | ICD-10-CM | POA: Diagnosis not present

## 2015-10-19 DIAGNOSIS — I509 Heart failure, unspecified: Secondary | ICD-10-CM | POA: Diagnosis not present

## 2015-10-19 DIAGNOSIS — Z794 Long term (current) use of insulin: Secondary | ICD-10-CM | POA: Diagnosis not present

## 2015-10-19 DIAGNOSIS — E669 Obesity, unspecified: Secondary | ICD-10-CM | POA: Diagnosis not present

## 2015-10-19 DIAGNOSIS — R040 Epistaxis: Secondary | ICD-10-CM

## 2015-10-19 DIAGNOSIS — E1122 Type 2 diabetes mellitus with diabetic chronic kidney disease: Secondary | ICD-10-CM | POA: Diagnosis not present

## 2015-10-19 DIAGNOSIS — Z9981 Dependence on supplemental oxygen: Secondary | ICD-10-CM | POA: Diagnosis not present

## 2015-10-19 DIAGNOSIS — Z7982 Long term (current) use of aspirin: Secondary | ICD-10-CM | POA: Diagnosis not present

## 2015-10-19 DIAGNOSIS — Z87891 Personal history of nicotine dependence: Secondary | ICD-10-CM | POA: Diagnosis not present

## 2015-10-19 DIAGNOSIS — I130 Hypertensive heart and chronic kidney disease with heart failure and stage 1 through stage 4 chronic kidney disease, or unspecified chronic kidney disease: Secondary | ICD-10-CM | POA: Diagnosis not present

## 2015-10-19 HISTORY — DX: Elevated white blood cell count, unspecified: D72.829

## 2015-10-19 LAB — CBC WITH DIFFERENTIAL/PLATELET
BASOS ABS: 0 10*3/uL (ref 0–0.1)
BASOS PCT: 0 %
EOS PCT: 2 %
Eosinophils Absolute: 0.2 10*3/uL (ref 0–0.7)
HCT: 39.1 % (ref 35.0–47.0)
Hemoglobin: 12.6 g/dL (ref 12.0–16.0)
Lymphocytes Relative: 14 %
Lymphs Abs: 1.9 10*3/uL (ref 1.0–3.6)
MCH: 28.9 pg (ref 26.0–34.0)
MCHC: 32.3 g/dL (ref 32.0–36.0)
MCV: 89.5 fL (ref 80.0–100.0)
MONO ABS: 0.7 10*3/uL (ref 0.2–0.9)
Monocytes Relative: 5 %
Neutro Abs: 10.7 10*3/uL — ABNORMAL HIGH (ref 1.4–6.5)
Neutrophils Relative %: 79 %
PLATELETS: 213 10*3/uL (ref 150–440)
RBC: 4.37 MIL/uL (ref 3.80–5.20)
RDW: 14.8 % — AB (ref 11.5–14.5)
WBC: 13.5 10*3/uL — ABNORMAL HIGH (ref 3.6–11.0)

## 2015-10-19 LAB — LACTATE DEHYDROGENASE: LDH: 117 U/L (ref 98–192)

## 2015-10-19 LAB — COMPREHENSIVE METABOLIC PANEL
ALBUMIN: 4.1 g/dL (ref 3.5–5.0)
ALT: 11 U/L — ABNORMAL LOW (ref 14–54)
ANION GAP: 7 (ref 5–15)
AST: 14 U/L — ABNORMAL LOW (ref 15–41)
Alkaline Phosphatase: 58 U/L (ref 38–126)
BILIRUBIN TOTAL: 0.4 mg/dL (ref 0.3–1.2)
BUN: 19 mg/dL (ref 6–20)
CO2: 34 mmol/L — ABNORMAL HIGH (ref 22–32)
Calcium: 9.4 mg/dL (ref 8.9–10.3)
Chloride: 98 mmol/L — ABNORMAL LOW (ref 101–111)
Creatinine, Ser: 0.65 mg/dL (ref 0.44–1.00)
GFR calc non Af Amer: >60 mL/min (ref >60)
GLUCOSE: 143 mg/dL — AB (ref 65–99)
POTASSIUM: 4 mmol/L (ref 3.5–5.1)
SODIUM: 139 mmol/L (ref 135–145)
TOTAL PROTEIN: 7.4 g/dL (ref 6.5–8.1)

## 2015-10-19 NOTE — Assessment & Plan Note (Addendum)
Chronic mild leukocytosis/mild neutrophilia- likely reactive. Rule out primary bone marrow causes. Check CBC CMP and LDH BCR ABL fish; peripheral blood flow cytometry.   # If the above labs are normal/ I would recommend no further follow-up; we will call the patient with results. PH: [phone] Home.  Thank you Ms.White NP for allowing me to participate in the care of your pleasant patient. Please do not hesitate to contact me with questions or concerns in the interim.

## 2015-10-19 NOTE — Progress Notes (Signed)
Patient here today as new evaluation regarding abnormal WBC.  Referred by  Eulogio Bear, NP.  Patient on 3L-O2.

## 2015-10-19 NOTE — Progress Notes (Signed)
[facility] CONSULT NOTE  Patient Care Team: Ricardo Jericho, NP as PCP - General (Family Medicine)  CHIEF COMPLAINTS/PURPOSE OF CONSULTATION:   #  LEUCOCYTOSIS- WBC 10.5- 12/N-Hb & platelets- N.   # COPD on O2/ obesity/ cane at home    No history exists.     HISTORY OF PRESENTING ILLNESS:  Karen [name] 70 y.o.  female long-standing history of mild leukocytosis as per the patient; and also history of COPD on home O2 and multiple other problems-with limited mobility has been referred to [place] for further evaluation of her leukocytosis.  Patient denies any frequent by mouth steroids. Denies any frequent infections. Denies any fevers or chills. Her appetite is fair. No weight loss. No new shortness of breath. Patient has chronic shortness of breath/chronic home O2.  ROS: A complete 10 point review of system is done which is negative except mentioned above in history of present illness  MEDICAL HISTORY:  Past Medical History:  Diagnosis Date  . Anemia   . Cataract   . CHF (congestive heart failure) ([place])   . CKD (chronic kidney disease)   . COPD (chronic obstructive pulmonary disease) ([place])   . Diabetes mellitus without complication ([place])   . Hypertension     SURGICAL HISTORY: Past Surgical History:  Procedure Laterality Date  . CESAREAN SECTION    . CHOLECYSTECTOMY    . EYE SURGERY      SOCIAL HISTORY: Social History   Social History  . Marital status: Married    Spouse name: N/A  . Number of children: N/A  . Years of education: N/A   Occupational History  . Not on file.   Social History Main Topics  . Smoking status: Former Research scientist (life sciences)  . Smokeless tobacco: Never Used  . Alcohol use No  . Drug use: No  . Sexual activity: Not on file   Other Topics Concern  . Not on file   Social History Narrative  . No narrative on file    FAMILY HISTORY: Family History  Problem Relation Age of Onset  . Family history unknown: Yes     ALLERGIES:  is allergic to ace inhibitors; gabapentin; lisinopril; and lyrica [pregabalin].  MEDICATIONS:  Current Outpatient Prescriptions  Medication Sig Dispense Refill  . aspirin EC 81 MG tablet Take 1 tablet by mouth daily.    Marland Kitchen atorvastatin (LIPITOR) 10 MG tablet Take 1 tablet by mouth daily.    . canagliflozin (INVOKANA) 100 MG TABS tablet Take 1 tablet by mouth daily.    Marland Kitchen losartan (COZAAR) 50 MG tablet Take 50 mg by mouth daily.    . metFORMIN (GLUCOPHAGE) 500 MG tablet Take 500 mg by mouth 2 (two) times daily with a meal.    . montelukast (SINGULAIR) 10 MG tablet Take 10 mg by mouth at bedtime.    Marland Kitchen NOVOLOG 100 UNIT/ML injection Inject 15-25 Units into the skin 3 (three) times daily with meals.     No current facility-administered medications for this visit.       Marland Kitchen  PHYSICAL EXAMINATION:   Vitals:   10/19/15 1447  BP: (!) 160/74  Pulse: 90  Resp: 18  Temp: 98.5 F (36.9 C)   Filed Weights   10/19/15 1447  Weight: 219 lb 4 oz (99.5 kg)    GENERAL: Well-nourished well-developed; Alert, no distress and comfortable.   2-3 L approximately; a wheelchair. obese. Accompanied by family. EYES: no pallor or icterus OROPHARYNX: no thrush or ulceration; good dentition  NECK: supple, no masses felt LYMPH:  no palpable lymphadenopathy in the cervical, axillary or inguinal regions LUNGS: Decreased breath sounds bilaterally.  No wheeze or crackles HEART/CVS: regular rate & rhythm and no murmurs; No lower extremity edema ABDOMEN: abdomen soft, non-tender and normal bowel sounds Musculoskeletal:no cyanosis of digits and no clubbing  PSYCH: alert & oriented x 3 with fluent speech NEURO: no focal motor/sensory deficits SKIN:  no rashes or significant lesions  LABORATORY DATA:  I have reviewed the data as listed Lab Results  Component Value Date   WBC 9.2 01/08/2015   HGB 10.9 (L) 01/08/2015   HCT 33.7 (L) 01/08/2015   MCV 89.1 01/08/2015   PLT 170 01/08/2015     Recent Labs  01/07/15 1941 01/08/15 0902  NA 141 135  K 4.1 4.5  CL 97* 93*  CO2 36* 38*  GLUCOSE 129* 345*  BUN 21* 19  CREATININE 0.74 0.73  CALCIUM 9.4 8.8*  GFRNONAA >60 >60  GFRAA >60 >60  PROT 6.8  --   ALBUMIN 3.7  --   AST 18  --   ALT 11*  --   ALKPHOS 52  --   BILITOT 0.3  --     RADIOGRAPHIC STUDIES: I have personally reviewed the radiological images as listed and agreed with the findings in the report. Mr Brain Wo Contrast  Result Date: 09/29/2015 CLINICAL DATA:  Frequent nosebleeds LEFT nostril. No slurred speech or weakness. EXAM: MRI HEAD WITHOUT CONTRAST TECHNIQUE: Multiplanar, multiecho pulse sequences of the brain and surrounding structures were obtained without intravenous contrast. COMPARISON:  01/07/2015 CT head. FINDINGS: No evidence for acute infarction, hemorrhage, mass lesion, hydrocephalus, or extra-axial fluid. Mild cerebral and cerebellar atrophy. Mild subcortical and periventricular T2 and FLAIR hyperintensities, likely chronic microvascular ischemic change. Pituitary, pineal, and cerebellar tonsils unremarkable. No upper cervical lesions. Flow voids are maintained throughout the carotid, basilar, and vertebral arteries. There are no areas of chronic hemorrhage. Visualized calvarium, skull base, and upper cervical osseous structures unremarkable. Scalp and extracranial soft tissues, orbits, and sinuses show no acute process. Asymmetric LEFT mastoid effusion without nasopharyngeal process. BILATERAL cataract extraction. IMPRESSION: Chronic changes as described, not unexpected for age. Acute intracranial abnormality. No nasopharyngeal or nasal cavity masses are seen which might explain patient's symptoms. Electronically Signed   By: Staci Righter M.D.   On: 09/29/2015 14:38   ASSESSMENT & PLAN:   Leucocytosis Chronic mild leukocytosis/mild neutrophilia- likely reactive. Rule out primary bone marrow causes. Check CBC CMP and LDH BCR ABL fish; peripheral  blood flow cytometry.   # If the above labs are normal/ I would recommend no further follow-up; we will call the patient with results. PH: [phone] Home.  Thank you Ms.White NP for allowing me to participate in the care of your pleasant patient. Please do not hesitate to contact me with questions or concerns in the interim.  All questions were answered. The patient knows to call the clinic with any problems, questions or concerns. # 30 minutes face-to-face with the patient discussing the above plan of care; more than 50% of time spent on counseling and coordination.     Cammie Sickle, MD 10/19/2015 3:11 PM

## 2015-10-27 LAB — COMP PANEL: LEUKEMIA/LYMPHOMA

## 2015-10-28 LAB — BCR-ABL1 FISH
CELLS ANALYZED: 200
Cells Counted: 200

## 2015-11-01 ENCOUNTER — Telehealth: Payer: Self-pay | Admitting: *Deleted

## 2015-11-01 NOTE — Telephone Encounter (Signed)
Called patient and LVM that labs are normal.  Patient will not need further work up in the [facility] [facility].

## 2015-11-01 NOTE — Telephone Encounter (Signed)
-----   Message from Cammie Sickle, MD sent at 10/29/2015  6:50 PM EDT ----- Please inform patient labs all normal;  no follow-ups recommended in the cancer Center.

## 2016-02-07 ENCOUNTER — Ambulatory Visit
Admission: EM | Admit: 2016-02-07 | Discharge: 2016-02-07 | Disposition: A | Discharge status: Home / Self Care | Payer: Medicare Other | Attending: Family Medicine | Admitting: Family Medicine

## 2016-02-07 DIAGNOSIS — J0110 Acute frontal sinusitis, unspecified: Secondary | ICD-10-CM

## 2016-02-07 DIAGNOSIS — N390 Urinary tract infection, site not specified: Secondary | ICD-10-CM

## 2016-02-07 DIAGNOSIS — J449 Chronic obstructive pulmonary disease, unspecified: Secondary | ICD-10-CM | POA: Insufficient documentation

## 2016-02-07 DIAGNOSIS — J0100 Acute maxillary sinusitis, unspecified: Secondary | ICD-10-CM

## 2016-02-07 DIAGNOSIS — N189 Chronic kidney disease, unspecified: Secondary | ICD-10-CM | POA: Diagnosis not present

## 2016-02-07 DIAGNOSIS — Z87891 Personal history of nicotine dependence: Secondary | ICD-10-CM | POA: Insufficient documentation

## 2016-02-07 DIAGNOSIS — Z7982 Long term (current) use of aspirin: Secondary | ICD-10-CM | POA: Diagnosis not present

## 2016-02-07 DIAGNOSIS — I130 Hypertensive heart and chronic kidney disease with heart failure and stage 1 through stage 4 chronic kidney disease, or unspecified chronic kidney disease: Secondary | ICD-10-CM | POA: Diagnosis not present

## 2016-02-07 DIAGNOSIS — J9622 Acute and chronic respiratory failure with hypercapnia: Secondary | ICD-10-CM | POA: Insufficient documentation

## 2016-02-07 DIAGNOSIS — Z7984 Long term (current) use of oral hypoglycemic drugs: Secondary | ICD-10-CM | POA: Insufficient documentation

## 2016-02-07 DIAGNOSIS — I509 Heart failure, unspecified: Secondary | ICD-10-CM | POA: Diagnosis not present

## 2016-02-07 DIAGNOSIS — E1122 Type 2 diabetes mellitus with diabetic chronic kidney disease: Secondary | ICD-10-CM | POA: Diagnosis not present

## 2016-02-07 DIAGNOSIS — J9621 Acute and chronic respiratory failure with hypoxia: Secondary | ICD-10-CM | POA: Diagnosis not present

## 2016-02-07 DIAGNOSIS — D649 Anemia, unspecified: Secondary | ICD-10-CM | POA: Diagnosis not present

## 2016-02-07 DIAGNOSIS — R51 Headache: Secondary | ICD-10-CM | POA: Diagnosis present

## 2016-02-07 LAB — URINALYSIS COMPLETE WITH MICROSCOPIC (ARMC ONLY)
Bilirubin Urine: NEGATIVE
GLUCOSE, UA: 500 mg/dL — AB
HGB URINE DIPSTICK: NEGATIVE
KETONES UR: NEGATIVE mg/dL
Leukocytes, UA: NEGATIVE
NITRITE: NEGATIVE
RBC / HPF: NONE SEEN RBC/hpf (ref 0–5)
SPECIFIC GRAVITY, URINE: 1.02 (ref 1.005–1.030)
pH: 5 (ref 5.0–8.0)

## 2016-02-07 MED ORDER — LEVOFLOXACIN 500 MG PO TABS: 500.0000 mg | ORAL_TABLET | Freq: Every day | ORAL | 0 refills | Status: DC

## 2016-02-07 MED ORDER — BENZONATATE 100 MG PO CAPS: 100.0000 mg | ORAL_CAPSULE | Freq: Three times a day (TID) | ORAL | 0 refills | Status: DC

## 2016-02-07 NOTE — Discharge Instructions (Signed)
Increase water intake

## 2016-02-07 NOTE — ED Provider Notes (Signed)
[facility]    CSN: [id number] Arrival date & time: 02/07/16  1226     History   Chief Complaint Chief Complaint  Patient presents with  . Headache    HPI Karen [name] is a 70 y.o. female.    Headache  Pain location:  Frontal Quality:  Dull Radiates to:  Does not radiate Associated symptoms: congestion, cough, facial pain, fatigue, fever, nausea and URI   Associated symptoms: no abdominal pain and no vomiting   URI  Presenting symptoms: congestion, cough, facial pain, fatigue and fever   Severity:  Moderate Onset quality:  Sudden Duration:  1 week Timing:  Constant Progression:  Worsening Chronicity:  New Relieved by:  None tried Associated symptoms: headaches and sinus pain   Associated symptoms: no wheezing   Risk factors: chronic cardiac disease, chronic respiratory disease, diabetes mellitus and recent illness   Risk factors: no recent travel   Dysuria  Pain quality:  Burning Pain severity:  Mild Onset quality:  Sudden Duration:  2 days Timing:  Constant Progression:  Worsening Chronicity:  New Recent urinary tract infections: no   Relieved by:  None tried Associated symptoms: fever, flank pain and nausea   Associated symptoms: no abdominal pain and no vomiting   Risk factors: no hx of pyelonephritis, no hx of urolithiasis, no kidney transplant, not pregnant, no recurrent urinary tract infections, no renal cysts and no urinary catheter     Past Medical History:  Diagnosis Date  . Anemia   . Cataract   . CHF (congestive heart failure) ([place])   . CKD (chronic kidney disease)   . COPD (chronic obstructive pulmonary disease) ([place])   . Diabetes mellitus without complication ([place])   . Hypertension     Patient Active Problem List   Diagnosis Date Noted  . Leucocytosis 10/19/2015  . 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 Acute on chronic respiratory failure with hypoxia and hypercapnia ([place]) 01/07/2015  . Sciatica 01/07/2015  . Weakness  01/07/2015    Past Surgical History:  Procedure Laterality Date  . CESAREAN SECTION    . CHOLECYSTECTOMY    . EYE SURGERY      OB History    No data available       Home Medications    Prior to Admission medications   Medication Sig Start Date End Date Taking? Authorizing Provider  fluticasone furoate-vilanterol (BREO ELLIPTA) 100-25 MCG/INH AEPB Inhale 1 puff into the lungs daily.   Yes Historical Provider, MD  OXYGEN Place 2 L/min into the nose.   Yes Historical Provider, MD  aspirin EC 81 MG tablet Take 1 tablet by mouth daily.    Historical Provider, MD  atorvastatin (LIPITOR) 10 MG tablet Take 1 tablet by mouth daily.    Historical Provider, MD  benzonatate (TESSALON) 100 MG capsule Take 1 capsule (100 mg total) by mouth every 8 (eight) hours. 02/07/16   Norval Gable, MD  canagliflozin (INVOKANA) 100 MG TABS tablet Take 1 tablet by mouth daily.    Historical Provider, MD  levofloxacin (LEVAQUIN) 500 MG tablet Take 1 tablet (500 mg total) by mouth daily. 02/07/16   Norval Gable, MD  losartan (COZAAR) 50 MG tablet Take 50 mg by mouth daily.    Historical Provider, MD  metFORMIN (GLUCOPHAGE) 500 MG tablet Take 500 mg by mouth 2 (two) times daily with a meal.    Historical Provider, MD  montelukast (SINGULAIR) 10 MG tablet Take 10 mg by mouth at bedtime.    Historical Provider,  MD  NOVOLOG 100 UNIT/ML injection Inject 15-25 Units into the skin 3 (three) times daily with meals.    Historical Provider, MD    Family History Family History  Problem Relation Age of Onset  . Family history unknown: Yes    Social History Social History  Substance Use Topics  . Smoking status: Former Research scientist (life sciences)  . Smokeless tobacco: Never Used  . Alcohol use No     Allergies   Ace inhibitors; Gabapentin; Lisinopril; and Lyrica [pregabalin]   Review of Systems Review of Systems  Constitutional: Positive for fatigue and fever.  HENT: Positive for congestion and sinus pain.   Respiratory:  Positive for cough. Negative for wheezing.   Gastrointestinal: Positive for nausea. Negative for abdominal pain and vomiting.  Genitourinary: Positive for dysuria and flank pain.  Neurological: Positive for headaches.     Physical Exam Triage Vital Signs ED Triage Vitals  Enc Vitals Group     BP 02/07/16 1311 (!) 157/49     Pulse Rate 02/07/16 1311 73     Resp 02/07/16 1311 20     Temp 02/07/16 1311 97.9 F (36.6 C)     Temp Source 02/07/16 1311 Oral     SpO2 02/07/16 1311 96 %     Weight 02/07/16 1313 217 lb (98.4 kg)     Height 02/07/16 1313 5\' 3"  (1.6 m)     Head Circumference --      Peak Flow --      Pain Score 02/07/16 1315 8     Pain Loc --      Pain Edu? --      Excl. in [place]? --    No data found.   Updated Vital Signs BP (!) 157/49 (BP Location: Left Arm)   Pulse 73   Temp 97.9 F (36.6 C) (Oral)   Resp 20   Ht 5\' 3"  (1.6 m)   Wt 217 lb (98.4 kg)   SpO2 96%   BMI 38.44 kg/m   Visual Acuity Right Eye Distance:   Left Eye Distance:   Bilateral Distance:    Right Eye Near:   Left Eye Near:    Bilateral Near:     Physical Exam  Constitutional: She appears well-developed and well-nourished. No distress.  HENT:  Head: Normocephalic and atraumatic.  Right Ear: Tympanic membrane, external ear and ear canal normal.  Left Ear: Tympanic membrane, external ear and ear canal normal.  Nose: Mucosal edema and rhinorrhea present. No nose lacerations, sinus tenderness, nasal deformity, septal deviation or nasal septal hematoma. No epistaxis.  No foreign bodies. Right sinus exhibits maxillary sinus tenderness and frontal sinus tenderness. Left sinus exhibits maxillary sinus tenderness and frontal sinus tenderness.  Mouth/Throat: Uvula is midline, oropharynx is clear and moist and mucous membranes are normal. No oropharyngeal exudate.  Eyes: Conjunctivae and EOM are normal. Pupils are equal, round, and reactive to light. Right eye exhibits no discharge. Left eye  exhibits no discharge. No scleral icterus.  Neck: Normal range of motion. Neck supple. No thyromegaly present.  Cardiovascular: Normal rate, regular rhythm and normal heart sounds.   Pulmonary/Chest: Effort normal and breath sounds normal. No respiratory distress. She has no wheezes. She has no rales.  Abdominal: Soft. Bowel sounds are normal. She exhibits no distension and no mass. There is no tenderness. There is no rebound and no guarding. No hernia.  Lymphadenopathy:    She has no cervical adenopathy.  Skin: She is not diaphoretic.  Nursing note and vitals  reviewed.    UC Treatments / Results  Labs (all labs ordered are listed, but only abnormal results are displayed) Labs Reviewed  URINALYSIS COMPLETEWITH MICROSCOPIC ([place]) - Abnormal; Notable for the following:       Result Value   Glucose, UA 500 (*)    Protein, ur TRACE (*)    Bacteria, UA MANY (*)    Squamous Epithelial / LPF 0-5 (*)    All other components within normal limits    EKG  EKG Interpretation None       Radiology No results found.  Procedures Procedures (including critical care time)  Medications Ordered in UC Medications - No data to display   Initial Impression / Assessment and Plan / UC Course  I have reviewed the triage vital signs and the nursing notes.  Pertinent labs & imaging results that were available during my care of the patient were reviewed by me and considered in my medical decision making (see chart for details).  Clinical Course       Final Clinical Impressions(s) / UC Diagnoses   Final diagnoses:  Urinary tract infection without hematuria, site unspecified  Acute maxillary sinusitis, recurrence not specified  Acute frontal sinusitis, recurrence not specified    New Prescriptions Discharge Medication List as of 02/07/2016  2:34 PM    START taking these medications   Details  levofloxacin (LEVAQUIN) 500 MG tablet Take 1 tablet (500 mg total) by mouth daily.,  Starting Mon 02/07/2016, Normal       1. Lab (UA) results and diagnosis reviewed with patient  2. rx as per orders above; reviewed possible side effects, interactions, risks and benefits  3. Recommend supportive treatment with increased water intake 4. Follow-up prn if symptoms worsen or don't improve   Norval Gable, MD 02/07/16 1524

## 2016-02-07 NOTE — ED Triage Notes (Signed)
Pt reports one week history of headache, nose "stopped up", low back pain all the way across back. Pain 8/10. Denies urinary sx.

## 2016-02-10 ENCOUNTER — Telehealth: Payer: Self-pay

## 2016-02-10 NOTE — Telephone Encounter (Signed)
Courtesy call back completed today for patients recent visit at [facility]. Patient did not answer, left message on voicemail to call back with any questions or concerns.   

## 2016-03-16 DIAGNOSIS — M7061 Trochanteric bursitis, right hip: Secondary | ICD-10-CM | POA: Diagnosis not present

## 2016-04-06 DIAGNOSIS — M1711 Unilateral primary osteoarthritis, right knee: Secondary | ICD-10-CM | POA: Diagnosis not present

## 2016-04-06 DIAGNOSIS — M1712 Unilateral primary osteoarthritis, left knee: Secondary | ICD-10-CM | POA: Diagnosis not present

## 2016-04-20 DIAGNOSIS — M1712 Unilateral primary osteoarthritis, left knee: Secondary | ICD-10-CM | POA: Diagnosis not present

## 2016-05-29 DIAGNOSIS — N183 Chronic kidney disease, stage 3 (moderate): Secondary | ICD-10-CM | POA: Diagnosis not present

## 2016-05-29 DIAGNOSIS — Z23 Encounter for immunization: Secondary | ICD-10-CM | POA: Diagnosis not present

## 2016-05-29 DIAGNOSIS — Z794 Long term (current) use of insulin: Secondary | ICD-10-CM | POA: Diagnosis not present

## 2016-05-29 DIAGNOSIS — I10 Essential (primary) hypertension: Secondary | ICD-10-CM | POA: Diagnosis not present

## 2016-05-29 DIAGNOSIS — E119 Type 2 diabetes mellitus without complications: Secondary | ICD-10-CM | POA: Diagnosis not present

## 2016-06-01 DIAGNOSIS — J449 Chronic obstructive pulmonary disease, unspecified: Secondary | ICD-10-CM | POA: Diagnosis not present

## 2016-06-01 DIAGNOSIS — J439 Emphysema, unspecified: Secondary | ICD-10-CM | POA: Diagnosis not present

## 2016-06-01 DIAGNOSIS — Z6841 Body Mass Index (BMI) 40.0 and over, adult: Secondary | ICD-10-CM | POA: Diagnosis not present

## 2016-06-15 DIAGNOSIS — Z23 Encounter for immunization: Secondary | ICD-10-CM | POA: Diagnosis not present

## 2016-06-23 ENCOUNTER — Ambulatory Visit (INDEPENDENT_AMBULATORY_CARE_PROVIDER_SITE_OTHER)
Admission: EM | Admit: 2016-06-23 | Discharge: 2016-06-23 | Disposition: A | Discharge status: Home / Self Care | Payer: Medicare Other | Source: Home / Self Care | Attending: Family Medicine | Admitting: Family Medicine

## 2016-06-23 ENCOUNTER — Emergency Department: Payer: Medicare Other

## 2016-06-23 ENCOUNTER — Ambulatory Visit: Payer: Medicare Other

## 2016-06-23 ENCOUNTER — Emergency Department
Admission: EM | Admit: 2016-06-23 | Discharge: 2016-06-23 | Disposition: A | Discharge status: Home / Self Care | Payer: Medicare Other | Attending: Emergency Medicine | Admitting: Emergency Medicine

## 2016-06-23 DIAGNOSIS — N189 Chronic kidney disease, unspecified: Secondary | ICD-10-CM | POA: Insufficient documentation

## 2016-06-23 DIAGNOSIS — Z87891 Personal history of nicotine dependence: Secondary | ICD-10-CM | POA: Insufficient documentation

## 2016-06-23 DIAGNOSIS — J441 Chronic obstructive pulmonary disease with (acute) exacerbation: Secondary | ICD-10-CM | POA: Diagnosis not present

## 2016-06-23 DIAGNOSIS — J449 Chronic obstructive pulmonary disease, unspecified: Secondary | ICD-10-CM | POA: Diagnosis not present

## 2016-06-23 DIAGNOSIS — Z86718 Personal history of other venous thrombosis and embolism: Secondary | ICD-10-CM

## 2016-06-23 DIAGNOSIS — J189 Pneumonia, unspecified organism: Secondary | ICD-10-CM | POA: Diagnosis not present

## 2016-06-23 DIAGNOSIS — J181 Lobar pneumonia, unspecified organism: Secondary | ICD-10-CM | POA: Diagnosis not present

## 2016-06-23 DIAGNOSIS — E1122 Type 2 diabetes mellitus with diabetic chronic kidney disease: Secondary | ICD-10-CM | POA: Insufficient documentation

## 2016-06-23 DIAGNOSIS — J0100 Acute maxillary sinusitis, unspecified: Secondary | ICD-10-CM

## 2016-06-23 DIAGNOSIS — Z7982 Long term (current) use of aspirin: Secondary | ICD-10-CM | POA: Insufficient documentation

## 2016-06-23 DIAGNOSIS — Z9981 Dependence on supplemental oxygen: Secondary | ICD-10-CM | POA: Insufficient documentation

## 2016-06-23 DIAGNOSIS — R7989 Other specified abnormal findings of blood chemistry: Secondary | ICD-10-CM

## 2016-06-23 DIAGNOSIS — Z833 Family history of diabetes mellitus: Secondary | ICD-10-CM | POA: Insufficient documentation

## 2016-06-23 DIAGNOSIS — I509 Heart failure, unspecified: Secondary | ICD-10-CM | POA: Insufficient documentation

## 2016-06-23 DIAGNOSIS — Z7984 Long term (current) use of oral hypoglycemic drugs: Secondary | ICD-10-CM | POA: Diagnosis not present

## 2016-06-23 DIAGNOSIS — Z794 Long term (current) use of insulin: Secondary | ICD-10-CM | POA: Insufficient documentation

## 2016-06-23 DIAGNOSIS — R0602 Shortness of breath: Secondary | ICD-10-CM | POA: Diagnosis not present

## 2016-06-23 DIAGNOSIS — Z79899 Other long term (current) drug therapy: Secondary | ICD-10-CM | POA: Insufficient documentation

## 2016-06-23 DIAGNOSIS — I130 Hypertensive heart and chronic kidney disease with heart failure and stage 1 through stage 4 chronic kidney disease, or unspecified chronic kidney disease: Secondary | ICD-10-CM

## 2016-06-23 DIAGNOSIS — R05 Cough: Secondary | ICD-10-CM | POA: Diagnosis not present

## 2016-06-23 LAB — CBC WITH DIFFERENTIAL/PLATELET
BASOS ABS: 0.2 10*3/uL — AB (ref 0–0.1)
Basophils Relative: 1 %
EOS PCT: 2 %
Eosinophils Absolute: 0.3 10*3/uL (ref 0–0.7)
HEMATOCRIT: 39.4 % (ref 35.0–47.0)
Hemoglobin: 12.9 g/dL (ref 12.0–16.0)
LYMPHS ABS: 2.2 10*3/uL (ref 1.0–3.6)
LYMPHS PCT: 15 %
MCH: 29.2 pg (ref 26.0–34.0)
MCHC: 32.6 g/dL (ref 32.0–36.0)
MCV: 89.6 fL (ref 80.0–100.0)
Monocytes Absolute: 0.8 10*3/uL (ref 0.2–0.9)
Monocytes Relative: 6 %
NEUTROS ABS: 10.9 10*3/uL — AB (ref 1.4–6.5)
Neutrophils Relative %: 76 %
PLATELETS: 228 10*3/uL (ref 150–440)
RBC: 4.4 MIL/uL (ref 3.80–5.20)
RDW: 15 % — ABNORMAL HIGH (ref 11.5–14.5)
WBC: 14.3 10*3/uL — AB (ref 3.6–11.0)

## 2016-06-23 LAB — BASIC METABOLIC PANEL
ANION GAP: 9 (ref 5–15)
BUN: 20 mg/dL (ref 6–20)
CHLORIDE: 96 mmol/L — AB (ref 101–111)
CO2: 30 mmol/L (ref 22–32)
Calcium: 9.5 mg/dL (ref 8.9–10.3)
Creatinine, Ser: 0.66 mg/dL (ref 0.44–1.00)
GFR calc Af Amer: >60 mL/min (ref >60)
GLUCOSE: 117 mg/dL — AB (ref 65–99)
POTASSIUM: 3.9 mmol/L (ref 3.5–5.1)
Sodium: 135 mmol/L (ref 135–145)

## 2016-06-23 LAB — URINALYSIS, COMPLETE (UACMP) WITH MICROSCOPIC
BILIRUBIN URINE: NEGATIVE
Bacteria, UA: NONE SEEN
Glucose, UA: 500 mg/dL — AB
HGB URINE DIPSTICK: NEGATIVE
Ketones, ur: NEGATIVE mg/dL
Leukocytes, UA: NEGATIVE
NITRITE: NEGATIVE
PH: 5.5 (ref 5.0–8.0)
Protein, ur: NEGATIVE mg/dL
RBC / HPF: NONE SEEN RBC/hpf (ref 0–5)
SPECIFIC GRAVITY, URINE: 1.015 (ref 1.005–1.030)

## 2016-06-23 LAB — FIBRIN DERIVATIVES D-DIMER (ARMC ONLY): FIBRIN DERIVATIVES D-DIMER (ARMC): 669.04 — AB (ref 0.00–499.00)

## 2016-06-23 LAB — GLUCOSE, CAPILLARY: GLUCOSE-CAPILLARY: 110 mg/dL — AB (ref 65–99)

## 2016-06-23 MED ORDER — IOPAMIDOL (ISOVUE-370) INJECTION 76%
75.0000 mL | Freq: Once | INTRAVENOUS | Status: AC | PRN
  Administered 2016-06-23: 75 mL via INTRAVENOUS
  Filled 2016-06-23: qty 75

## 2016-06-23 MED ORDER — DOXYCYCLINE HYCLATE 50 MG PO CAPS: 100.0000 mg | ORAL_CAPSULE | Freq: Two times a day (BID) | ORAL | 0 refills | Status: AC

## 2016-06-23 MED ORDER — PREDNISONE 10 MG PO TABS: 50.0000 mg | ORAL_TABLET | Freq: Every day | ORAL | 0 refills | Status: AC

## 2016-06-23 NOTE — ED Triage Notes (Addendum)
Pt sent from Urgent care in [place] to r/o possible PE. Pt states she went there for sinus congestion. Denies any chest pain or increased SOB. States she has COPD and is on O2 continuous. States they said some thing on the xray suggested PE.Marland Kitchen Per Dr. Corky Downs no protocols needed. Pt had labs and xray performed at urgent care.

## 2016-06-23 NOTE — ED Provider Notes (Signed)
[facility] ____________________________________________  Time seen: Approximately 11:12 AM  I have reviewed the triage vital signs and the nursing notes.   HISTORY  Chief Complaint Back Pain and Nasal Congestion  HPI Karen [name] is a 71 y.o. female  presenting with husband at bedside for evaluation of 3-4 days of runny nose, nasal congestion, sinus pressure and cough. Patient reports that she is oxygen dependent COPD patient usually on 3-4 L with oxygenation saturation at 94-95 normally. Patient denies any shortness of breath or obvious wheezing. Patient reports sinus congestion and pressure as her biggest complaint. Reports that she is not outside and exposed allergies. Reports her husband recently sick with similar this past week as well as her son. Reports continues to eat and drink well. States does have some low mid back pain, and denies fall, injury or trauma. Denies urinary frequency, urgency or burning with urination. Denies diarrhea. Denies any fevers.   Denies chest pain, shortness of breath, abdominal pain, dysuria, extremity pain, extremity swelling or rash. Denies recent sickness. Denies recent antibiotic use.  Denies any recent hospitalization, surgeries or immobilization. Patient states that she has home albuterol nebulizers for as needed. Patient does report she has a past history of renal insufficiency but reports renal function has been within normal parameters in the last several months. Denies cardiac history. Denies any recent prednisone use. Diabetic use an insulin NovoLog and reports blood sugars have been well controlled recently.  WHITE, Orlene Och, NP: PCP   Past Medical History:  Diagnosis Date  . Anemia   . Cataract   . CHF (congestive heart failure) ([place])   . CKD (chronic kidney disease)   . COPD (chronic obstructive pulmonary disease) ([place])   . Diabetes mellitus without complication ([place])   . Hypertension   DVT  Patient Active  Problem List   Diagnosis Date Noted  . Leucocytosis 10/19/2015  . 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 Acute on chronic respiratory failure with hypoxia and hypercapnia ([place]) 01/07/2015  . Sciatica 01/07/2015  . Weakness 01/07/2015    Past Surgical History:  Procedure Laterality Date  . CESAREAN SECTION    . CHOLECYSTECTOMY    . EYE SURGERY       No current facility-administered medications for this encounter.   Current Outpatient Prescriptions:  .  losartan-hydrochlorothiazide (HYZAAR) 100-12.5 MG tablet, Take 1 tablet by mouth daily., Disp: , Rfl:  .  aspirin EC 81 MG tablet, Take 1 tablet by mouth daily., Disp: , Rfl:  .  atorvastatin (LIPITOR) 10 MG tablet, Take 1 tablet by mouth daily., Disp: , Rfl:  .  benzonatate (TESSALON) 100 MG capsule, Take 1 capsule (100 mg total) by mouth every 8 (eight) hours., Disp: 30 capsule, Rfl: 0 .  canagliflozin (INVOKANA) 100 MG TABS tablet, Take 1 tablet by mouth daily., Disp: , Rfl:  .  fluticasone furoate-vilanterol (BREO ELLIPTA) 100-25 MCG/INH AEPB, Inhale 1 puff into the lungs daily., Disp: , Rfl:  .  levofloxacin (LEVAQUIN) 500 MG tablet, Take 1 tablet (500 mg total) by mouth daily., Disp: 5 tablet, Rfl: 0 .  losartan (COZAAR) 50 MG tablet, Take 100 mg by mouth daily. , Disp: , Rfl:  .  metFORMIN (GLUCOPHAGE) 500 MG tablet, Take 500 mg by mouth 2 (two) times daily with a meal., Disp: , Rfl:  .  montelukast (SINGULAIR) 10 MG tablet, Take 10 mg by mouth at bedtime., Disp: , Rfl:  .  NOVOLOG 100 UNIT/ML injection, Inject 15-25 Units into the skin 3 (three)  times daily with meals., Disp: , Rfl:  .  OXYGEN, Place 3 L/min into the nose. , Disp: , Rfl:   Allergies Ace inhibitors; Gabapentin; Lisinopril; and Lyrica [pregabalin]  Family History  Problem Relation Age of Onset  . Family history unknown:   Mother- DM  Social History Social History  Substance Use Topics  . Smoking status: Former Research scientist (life sciences)  . Smokeless tobacco: Never Used  . Alcohol use  No    Review of Systems Constitutional: No fever/chills Eyes: No visual changes. ENT: States did have some sore throat few days ago, now resolved. As above.  Cardiovascular: Denies chest pain. Respiratory: Denies shortness of breath. Gastrointestinal: No abdominal pain.  No nausea, no vomiting.  No diarrhea.  No constipation. Genitourinary: Negative for dysuria. Musculoskeletal: As above.  Skin: Negative for rash.   ____________________________________________   PHYSICAL EXAM:  VITAL SIGNS: ED Triage Vitals  Enc Vitals Group     BP 06/23/16 1001 127/61     Pulse Rate 06/23/16 1001 83     Resp 06/23/16 1001 (!) 24     Temp 06/23/16 1001 98 F (36.7 C)     Temp Source 06/23/16 1001 Oral     SpO2 06/23/16 1001 94 %     Weight 06/23/16 1001 226 lb (102.5 kg)     Height 06/23/16 1001 5\' 3"  (1.6 m)     Head Circumference --      Peak Flow --      Pain Score 06/23/16 1002 10     Pain Loc --      Pain Edu? --      Excl. in [place]? --    Constitutional: Alert and oriented. Well appearing and in no acute distress. Eyes: Conjunctivae are normal. PERRL. EOMI. Head: Atraumatic.Mild to moderate tenderness to palpation bilateral frontal and maxillary sinuses. No swelling. No erythema.   Ears: no erythema, normal TMs bilaterally.   Nose: nasal congestion with bilateral nasal turbinate erythema and edema.   Mouth/Throat: Mucous membranes are moist.  Oropharynx non-erythematous. No tonsillar swelling or exudate.  Neck: No stridor.  No cervical spine tenderness to palpation. Hematological/Lymphatic/Immunilogical: No cervical lymphadenopathy. Cardiovascular: Normal rate, regular rhythm. Grossly normal heart sounds.  Good peripheral circulation. Respiratory: Normal respiratory effort.  No retractions. No rhonchi. Mild scattered inspiratory wheezes. Speaks in complete sentences. 3 L nasal cannula oxygen present. Good air movement.  Gastrointestinal: Soft and nontender.No CVA  tenderness. Musculoskeletal:Bilateral pedal pulses equal and easily palpated. No lower extremity tenderness bilaterally, no calf tenderness bilaterally. No cervical, thoracic or lumbar tenderness to palpation.  Neurologic:  Normal speech and language.No gait instability. Skin:  Skin is warm, dry and intact. No rash noted. Psychiatric: Mood and affect are normal. Speech and behavior are normal.  ___________________________________________   LABS (all labs ordered are listed, but only abnormal results are displayed)  Labs Reviewed  URINALYSIS, COMPLETE (UACMP) WITH MICROSCOPIC - Abnormal; Notable for the following:       Result Value   Color, Urine STRAW (*)    Glucose, UA 500 (*)    Squamous Epithelial / LPF 0-5 (*)    All other components within normal limits  FIBRIN DERIVATIVES D-DIMER ([facility] ONLY) - Abnormal; Notable for the following:    Fibrin derivatives D-dimer ([facility]) 669.04 (*)    All other components within normal limits  CBC WITH DIFFERENTIAL/PLATELET - Abnormal; Notable for the following:    WBC 14.3 (*)    RDW 15.0 (*)    Neutro Abs 10.9 (*)  Basophils Absolute 0.2 (*)    All other components within normal limits  BASIC METABOLIC PANEL - Abnormal; Notable for the following:    Chloride 96 (*)    Glucose, Bld 117 (*)    All other components within normal limits  GLUCOSE, CAPILLARY - Abnormal; Notable for the following:    Glucose-Capillary 110 (*)    All other components within normal limits    RADIOLOGY  Dg Chest 2 View  Result Date: 06/23/2016 CLINICAL DATA:  Nonproductive cough.  Fever . EXAM: CHEST  2 VIEW COMPARISON:  04/02/2014. FINDINGS: Mediastinum and hilar structures are normal. Cardiomegaly with normal pulmonary vascularity. Small peripheral density is noted over the right mid lung. This could represent a focal area of atelectasis or infiltrate. Pulmonary infarct cannot be excluded. IV contrast-enhanced chest CT can be obtained to further evaluate. Mild  basilar atelectasis. Stable cardiomegaly. No pulmonary venous congestion. No pleural effusion. No pneumothorax. Degenerative changes thoracic spine. IMPRESSION: 1. Small peripheral density noted of the right mid lung. This could represent focal small area of atelectasis/infiltrate. Pulmonary infarct cannot be excluded. If further evaluation is needed for pulmonary embolus IV contrast-enhanced chest CT can be obtained. 2. Stable cardiomegaly. Electronically Signed   By: Marcello Moores  Register   On: 06/23/2016 11:23   ____________________________________________   PROCEDURES Procedures   INITIAL IMPRESSION / ASSESSMENT AND PLAN / ED COURSE  Pertinent labs & imaging results that were available during my care of the patient were reviewed by me and considered in my medical decision making (see chart for details).  Well-appearing patient. No acute distress. As patient with history of oxygen dependent COPD, complaining of some low back pain nontraumatic as well as URI symptoms, will evaluate chest x-ray as well as urinalysis. Patient declines breathing treatment at this time. Suspect viral illness, but with patient's comorbidities and no recent antibiotic use will place patient on oral doxycycline and prednisone.  Chest Xray result reviewed. Per radiologist, right mid lung area that could represent small area of atelectasis/infiltrate, but pulmonary infarct can not be excluded. Again discussed in detail with patient, denies any recent surgeries or immobilizations, no previous history of cancer, no chest pain or shortness of breath, no atypical leg swelling, no hemoptysis. Patient does report greater than 10 years ago she did have a DVT. Well's criteria for pulmonary embolism utilized and cord was criteria patient is low risk which showed patient has 1.3% chance of PE. Discussed in detail with patient and husband at bedside, regarding evaluation of x-ray findings including concern for pulmonary embolism. Discussed  with patient options including evaluating by DVT as well as CT of chest. Patient and husband state that he would like to have d-dimer lab tests performed at this time. Patient states that if her d-dimer is negative and she would like to go home with planned initial treatment and will follow up with her pulmonologist. Patient understands and verbalizes that if d-dimer is negative there is still a potential risk of pulmonary embolism and understands the risk of even up to death. Patient also verbalizes understanding that if d-dimer is positive she will proceed to emergency room for further evaluation including CT and further evaluation.  Laboratory results reviewed and discussed with patient. D-dimer elevated. Patient and her husband report they will proceed to [facility] ER for further evaluation, and husband states that he will drive her directly there. She'll RN at [employer] called and given report. Patient stable at time of discharge.   ____________________________________________  FINAL CLINICAL IMPRESSION(S) / ED DIAGNOSES  Final diagnoses:  Acute maxillary sinusitis, recurrence not specified  COPD exacerbation (HCC)  Elevated d-dimer     Discharge Medication List as of 06/23/2016  1:28 PM      Note: This dictation was prepared with Dragon dictation along with smaller phrase technology. Any transcriptional errors that result from this process are unintentional.         Marylene Land, NP 06/23/16 1401    Marylene Land, NP 06/23/16 1402

## 2016-06-23 NOTE — ED Provider Notes (Signed)
[facility] Emergency Department Provider Note  ____________________________________________   First MD Initiated Contact with Patient 06/23/16 1656     (approximate)  I have reviewed the triage vital signs and the nursing notes.   HISTORY  Chief Complaint Shortness of Breath and Nasal Congestion    HPI Karen [name] is a 71 y.o. female who is sent to the emergency department from her primary care practitioner for a CT angiogram of her chest. She has a past medical history of COPD and went to medical appointment today for what she thought were allergies. The rest practitioner noted some shortness of breath and checked a d-dimerwhich was over 500% she was sent to the emergency department for a CT scan. The patient feels no more short of breath than her baseline. She has no history of DVT or pulmonary embolism. She's had no fevers or chills. No leg swelling and no recent travel or immobilization.   Past Medical History:  Diagnosis Date  . Anemia   . Cataract   . CHF (congestive heart failure) ([place])   . CKD (chronic kidney disease)   . COPD (chronic obstructive pulmonary disease) ([place])   . Diabetes mellitus without complication ([place])   . Hypertension     Patient Active Problem List   Diagnosis Date Noted  . Leucocytosis 10/19/2015  . 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 Acute on chronic respiratory failure with hypoxia and hypercapnia ([place]) 01/07/2015  . Sciatica 01/07/2015  . Weakness 01/07/2015    Past Surgical History:  Procedure Laterality Date  . CESAREAN SECTION    . CHOLECYSTECTOMY    . EYE SURGERY      Prior to Admission medications   Medication Sig Start Date End Date Taking? Authorizing Provider  aspirin EC 81 MG tablet Take 1 tablet by mouth daily.    Historical Provider, MD  atorvastatin (LIPITOR) 10 MG tablet Take 1 tablet by mouth daily.    Historical Provider, MD  benzonatate (TESSALON) 100 MG capsule Take 1 capsule (100 mg total)  by mouth every 8 (eight) hours. 02/07/16   Norval Gable, MD  canagliflozin (INVOKANA) 100 MG TABS tablet Take 1 tablet by mouth daily.    Historical Provider, MD  doxycycline (VIBRAMYCIN) 50 MG capsule Take 2 capsules (100 mg total) by mouth 2 (two) times daily. 06/23/16 06/30/16  Darel Hong, MD  fluticasone furoate-vilanterol (BREO ELLIPTA) 100-25 MCG/INH AEPB Inhale 1 puff into the lungs daily.    Historical Provider, MD  levofloxacin (LEVAQUIN) 500 MG tablet Take 1 tablet (500 mg total) by mouth daily. 02/07/16   Norval Gable, MD  losartan (COZAAR) 50 MG tablet Take 100 mg by mouth daily.     Historical Provider, MD  losartan-hydrochlorothiazide (HYZAAR) 100-12.5 MG tablet Take 1 tablet by mouth daily.    Historical Provider, MD  metFORMIN (GLUCOPHAGE) 500 MG tablet Take 500 mg by mouth 2 (two) times daily with a meal.    Historical Provider, MD  montelukast (SINGULAIR) 10 MG tablet Take 10 mg by mouth at bedtime.    Historical Provider, MD  NOVOLOG 100 UNIT/ML injection Inject 15-25 Units into the skin 3 (three) times daily with meals.    Historical Provider, MD  OXYGEN Place 3 L/min into the nose.     Historical Provider, MD  predniSONE (DELTASONE) 10 MG tablet Take 5 tablets (50 mg total) by mouth daily. 06/23/16 06/28/16  Darel Hong, MD    Allergies Ace inhibitors; Gabapentin; Lisinopril; and Lyrica [pregabalin]  Family History  Problem Relation Age of Onset  . Family history unknown: Yes    Social History Social History  Substance Use Topics  . Smoking status: Former Research scientist (life sciences)  . Smokeless tobacco: Never Used  . Alcohol use No    Review of Systems Constitutional: No fever/chills Eyes: No visual changes. ENT: No sore throat. Cardiovascular: Denies chest pain. Respiratory: Positive shortness of breath. Gastrointestinal: No abdominal pain.  No nausea, no vomiting.  No diarrhea.  No constipation. Genitourinary: Negative for dysuria. Musculoskeletal: Negative for back  pain. Skin: Negative for rash. Neurological: Negative for headaches, focal weakness or numbness.  10-point ROS otherwise negative.  ____________________________________________   PHYSICAL EXAM:  VITAL SIGNS: ED Triage Vitals [06/23/16 1440]  Enc Vitals Group     BP (!) 145/73     Pulse Rate 90     Resp 18     Temp 97.7 F (36.5 C)     Temp Source Oral     SpO2 94 %     Weight 226 lb (102.5 kg)     Height 5\' 3"  (1.6 m)     Head Circumference      Peak Flow      Pain Score 0     Pain Loc      Pain Edu?      Excl. in [place]?     Constitutional: Alert and oriented x 4 well appearing nontoxic no diaphoresis speaks in full, clear sentencesOn her home nasal cannula joking and laughing Eyes: PERRL EOMI. Head: Atraumatic. Nose: No congestion/rhinnorhea. Mouth/Throat: No trismus Neck: No stridor.   Cardiovascular: Normal rate, regular rhythm. Grossly normal heart sounds.  Good peripheral circulation. Respiratory: Normal respiratory effort.  No retractions. Lungs CTAB and moving good air  Musculoskeletal: No lower extremity edema   Neurologic:  Normal speech and language. No gross focal neurologic deficits are appreciated. Skin:  Skin is warm, dry and intact. No rash noted. Psychiatric: Mood and affect are normal. Speech and behavior are normal.    ____________________________________________   ________________________________________   LABS (all labs ordered are listed, but only abnormal results are displayed)  Labs Reviewed - No data to display   __________________________________________  EKG   ____________________________________________  RADIOLOGY  CT scan shows likely infection and no signs of blood clot ____________________________________________   PROCEDURES  Procedure(s) performed: no  Procedures  Critical Care performed: no  ____________________________________________   INITIAL IMPRESSION / ASSESSMENT AND PLAN / ED COURSE  Pertinent labs  & imaging results that were available during my care of the patient were reviewed by me and considered in my medical decision making (see chart for details).  The patient arrives me very well-appearing with RAD a CT scan showing infection and no [place] every embolism. Her primary care provider had recommended doxycycline and prednisone although they were never given a prescription so I will write one now. She is medically stable for outpatient management understands and agrees the plan.      ____________________________________________   FINAL CLINICAL IMPRESSION(S) / ED DIAGNOSES  Final diagnoses:  Pneumonia of right upper lobe due to infectious organism [facility])      NEW MEDICATIONS STARTED DURING THIS VISIT:  Discharge Medication List as of 06/23/2016  5:03 PM    START taking these medications   Details  doxycycline (VIBRAMYCIN) 50 MG capsule Take 2 capsules (100 mg total) by mouth 2 (two) times daily., Starting Fri 06/23/2016, Until Fri 06/30/2016, Print    predniSONE (DELTASONE) 10 MG tablet Take 5 tablets (50 mg  total) by mouth daily., Starting Fri 06/23/2016, Until Wed 06/28/2016, Print         Note:  This document was prepared using Dragon voice recognition software and may include unintentional dictation errors.     Darel Hong, MD 06/24/16 1150

## 2016-06-23 NOTE — ED Triage Notes (Addendum)
Pt with multiple medical complaints. Recent sore throat, but better today. Head feels congested, nasal drainage, and back pain. Back pain is located in mid thoracic region and denies urinary sx

## 2016-06-23 NOTE — Discharge Instructions (Signed)
Please take all of your antibiotics and steroids as scheduled. Return to the emergency department for any new or worsening symptoms such as worsening shortness of breath, fevers, chills, or for any other concerns. Otherwise follow up with your primary care physician as scheduled.  It was a pleasure to take care of you today, and thank you for coming to our emergency department.  If you have any questions or concerns before leaving please ask the nurse to grab me and I'm more than happy to go through your aftercare instructions again.  If you were prescribed any opioid pain medication today such as Norco, Vicodin, Percocet, morphine, hydrocodone, or oxycodone please make sure you do not drive when you are taking this medication as it can alter your ability to drive safely.  If you have any concerns once you are home that you are not improving or are in fact getting worse before you can make it to your follow-up appointment, please do not hesitate to call 911 and come back for further evaluation.  Darel Hong MD  Results for orders placed or performed during the hospital encounter of 06/23/16  Urinalysis, Complete w Microscopic  Result Value Ref Range   Color, Urine STRAW (A) YELLOW   APPearance CLEAR CLEAR   Specific Gravity, Urine 1.015 1.005 - 1.030   pH 5.5 5.0 - 8.0   Glucose, UA 500 (A) NEGATIVE mg/dL   Hgb urine dipstick NEGATIVE NEGATIVE   Bilirubin Urine NEGATIVE NEGATIVE   Ketones, ur NEGATIVE NEGATIVE mg/dL   Protein, ur NEGATIVE NEGATIVE mg/dL   Nitrite NEGATIVE NEGATIVE   Leukocytes, UA NEGATIVE NEGATIVE   Squamous Epithelial / LPF 0-5 (A) NONE SEEN   WBC, UA 0-5 0 - 5 WBC/hpf   RBC / HPF NONE SEEN 0 - 5 RBC/hpf   Bacteria, UA NONE SEEN NONE SEEN  Fibrin derivatives D-Dimer ([facility] only)  Result Value Ref Range   Fibrin derivatives D-dimer ([facility]) 669.04 (H) 0.00 - 499.00  CBC with Differential  Result Value Ref Range   WBC 14.3 (H) 3.6 - 11.0 K/uL   RBC 4.40 3.80 - 5.20  MIL/uL   Hemoglobin 12.9 12.0 - 16.0 g/dL   HCT 39.4 35.0 - 47.0 %   MCV 89.6 80.0 - 100.0 fL   MCH 29.2 26.0 - 34.0 pg   MCHC 32.6 32.0 - 36.0 g/dL   RDW 15.0 (H) 11.5 - 14.5 %   Platelets 228 150 - 440 K/uL   Neutrophils Relative % 76 %   Neutro Abs 10.9 (H) 1.4 - 6.5 K/uL   Lymphocytes Relative 15 %   Lymphs Abs 2.2 1.0 - 3.6 K/uL   Monocytes Relative 6 %   Monocytes Absolute 0.8 0.2 - 0.9 K/uL   Eosinophils Relative 2 %   Eosinophils Absolute 0.3 0 - 0.7 K/uL   Basophils Relative 1 %   Basophils Absolute 0.2 (H) 0 - 0.1 K/uL  Basic metabolic panel  Result Value Ref Range   Sodium 135 135 - 145 mmol/L   Potassium 3.9 3.5 - 5.1 mmol/L   Chloride 96 (L) 101 - 111 mmol/L   CO2 30 22 - 32 mmol/L   Glucose, Bld 117 (H) 65 - 99 mg/dL   BUN 20 6 - 20 mg/dL   Creatinine, Ser 0.66 0.44 - 1.00 mg/dL   Calcium 9.5 8.9 - 10.3 mg/dL   GFR calc non Af Amer >60 >60 mL/min   GFR calc Af Amer >60 >60 mL/min   Anion gap  9 5 - 15  Glucose, capillary  Result Value Ref Range   Glucose-Capillary 110 (H) 65 - 99 mg/dL   Dg Chest 2 View  Result Date: 06/23/2016 CLINICAL DATA:  Nonproductive cough.  Fever . EXAM: CHEST  2 VIEW COMPARISON:  04/02/2014. FINDINGS: Mediastinum and hilar structures are normal. Cardiomegaly with normal pulmonary vascularity. Small peripheral density is noted over the right mid lung. This could represent a focal area of atelectasis or infiltrate. Pulmonary infarct cannot be excluded. IV contrast-enhanced chest CT can be obtained to further evaluate. Mild basilar atelectasis. Stable cardiomegaly. No pulmonary venous congestion. No pleural effusion. No pneumothorax. Degenerative changes thoracic spine. IMPRESSION: 1. Small peripheral density noted of the right mid lung. This could represent focal small area of atelectasis/infiltrate. Pulmonary infarct cannot be excluded. If further evaluation is needed for pulmonary embolus IV contrast-enhanced chest CT can be obtained. 2.  Stable cardiomegaly. Electronically Signed   By: Marcello Moores  Register   On: 06/23/2016 11:23   Ct Angio Chest Pe W/cm &/or Wo Cm  Result Date: 06/23/2016 CLINICAL DATA:  71 year old female with shortness of breath, congestion and focal right pulmonary opacity identified on recent chest x-ray. EXAM: CT ANGIOGRAPHY CHEST WITH CONTRAST TECHNIQUE: Multidetector CT imaging of the chest was performed using the standard protocol during bolus administration of intravenous contrast. Multiplanar CT image reconstructions and MIPs were obtained to evaluate the vascular anatomy. CONTRAST:  75 cc intravenous Isovue 370 COMPARISON:  06/23/2016 and prior chest radiographs. FINDINGS: Cardiovascular: This is a technically satisfactory study. No pulmonary emboli are identified. Mild cardiomegaly noted. There is no evidence of thoracic aortic aneurysm. Coronary artery calcifications are present. No pericardial effusion noted. Mediastinum/Nodes: No enlarged mediastinal, hilar, or axillary lymph nodes. Thyroid gland, trachea, and esophagus demonstrate no significant findings. Lungs/Pleura: Streaky/tree-in-bud opacities within the lower posterolateral aspect of the right upper lobe may represent infection and/or atelectasis. No pulmonary mass, suspicious nodule, consolidation, pleural effusion or pneumothorax identified. Upper Abdomen: No acute abnormality. Musculoskeletal: No chest wall abnormality. No acute or significant osseous findings. Review of the MIP images confirms the above findings. IMPRESSION: Streaky/tree-in-bud opacities within the lower posterolateral aspect of the right upper lobe corresponding to the recent radiographic finding, and likely representing infection and/or atelectasis. Radiographic follow-up to resolution recommended. No evidence of pulmonary emboli or thoracic aortic aneurysm. Cardiomegaly and coronary artery disease. Electronically Signed   By: Margarette Canada M.D.   On: 06/23/2016 16:49

## 2016-06-23 NOTE — Discharge Instructions (Signed)
Go directly to emergency room as discussed.  °

## 2016-07-11 DIAGNOSIS — E538 Deficiency of other specified B group vitamins: Secondary | ICD-10-CM | POA: Diagnosis not present

## 2016-07-11 DIAGNOSIS — R809 Proteinuria, unspecified: Secondary | ICD-10-CM | POA: Diagnosis not present

## 2016-07-11 DIAGNOSIS — Z794 Long term (current) use of insulin: Secondary | ICD-10-CM | POA: Diagnosis not present

## 2016-07-11 DIAGNOSIS — E1129 Type 2 diabetes mellitus with other diabetic kidney complication: Secondary | ICD-10-CM | POA: Diagnosis not present

## 2016-07-11 DIAGNOSIS — E11649 Type 2 diabetes mellitus with hypoglycemia without coma: Secondary | ICD-10-CM | POA: Diagnosis not present

## 2016-08-18 DIAGNOSIS — G8929 Other chronic pain: Secondary | ICD-10-CM | POA: Diagnosis not present

## 2016-08-18 DIAGNOSIS — M5441 Lumbago with sciatica, right side: Secondary | ICD-10-CM | POA: Diagnosis not present

## 2016-08-18 DIAGNOSIS — Z6841 Body Mass Index (BMI) 40.0 and over, adult: Secondary | ICD-10-CM | POA: Diagnosis not present

## 2016-08-18 DIAGNOSIS — M1711 Unilateral primary osteoarthritis, right knee: Secondary | ICD-10-CM | POA: Diagnosis not present

## 2016-08-18 DIAGNOSIS — M5442 Lumbago with sciatica, left side: Secondary | ICD-10-CM | POA: Diagnosis not present

## 2016-08-18 DIAGNOSIS — Z794 Long term (current) use of insulin: Secondary | ICD-10-CM | POA: Diagnosis not present

## 2016-08-18 DIAGNOSIS — E11649 Type 2 diabetes mellitus with hypoglycemia without coma: Secondary | ICD-10-CM | POA: Diagnosis not present

## 2016-08-18 DIAGNOSIS — M1712 Unilateral primary osteoarthritis, left knee: Secondary | ICD-10-CM | POA: Diagnosis not present

## 2016-09-01 DIAGNOSIS — Z6841 Body Mass Index (BMI) 40.0 and over, adult: Secondary | ICD-10-CM | POA: Diagnosis not present

## 2016-09-01 DIAGNOSIS — E11649 Type 2 diabetes mellitus with hypoglycemia without coma: Secondary | ICD-10-CM | POA: Diagnosis not present

## 2016-09-01 DIAGNOSIS — M1711 Unilateral primary osteoarthritis, right knee: Secondary | ICD-10-CM

## 2016-09-01 DIAGNOSIS — Z794 Long term (current) use of insulin: Secondary | ICD-10-CM | POA: Diagnosis not present

## 2016-09-01 DIAGNOSIS — M1712 Unilateral primary osteoarthritis, left knee: Secondary | ICD-10-CM | POA: Diagnosis not present

## 2016-09-01 HISTORY — DX: Unilateral primary osteoarthritis, right knee: M17.11

## 2016-10-04 ENCOUNTER — Ambulatory Visit
Admission: EM | Admit: 2016-10-04 | Discharge: 2016-10-04 | Disposition: A | Discharge status: Home / Self Care | Payer: Medicare Other | Attending: Family Medicine | Admitting: Family Medicine

## 2016-10-04 DIAGNOSIS — I130 Hypertensive heart and chronic kidney disease with heart failure and stage 1 through stage 4 chronic kidney disease, or unspecified chronic kidney disease: Secondary | ICD-10-CM | POA: Diagnosis not present

## 2016-10-04 DIAGNOSIS — J449 Chronic obstructive pulmonary disease, unspecified: Secondary | ICD-10-CM | POA: Insufficient documentation

## 2016-10-04 DIAGNOSIS — Z87891 Personal history of nicotine dependence: Secondary | ICD-10-CM | POA: Diagnosis not present

## 2016-10-04 DIAGNOSIS — I509 Heart failure, unspecified: Secondary | ICD-10-CM | POA: Insufficient documentation

## 2016-10-04 DIAGNOSIS — L299 Pruritus, unspecified: Secondary | ICD-10-CM | POA: Diagnosis present

## 2016-10-04 DIAGNOSIS — L739 Follicular disorder, unspecified: Secondary | ICD-10-CM | POA: Insufficient documentation

## 2016-10-04 DIAGNOSIS — N189 Chronic kidney disease, unspecified: Secondary | ICD-10-CM | POA: Diagnosis not present

## 2016-10-04 DIAGNOSIS — L0292 Furuncle, unspecified: Secondary | ICD-10-CM | POA: Diagnosis not present

## 2016-10-04 DIAGNOSIS — Z7951 Long term (current) use of inhaled steroids: Secondary | ICD-10-CM | POA: Diagnosis not present

## 2016-10-04 DIAGNOSIS — Z7982 Long term (current) use of aspirin: Secondary | ICD-10-CM | POA: Diagnosis not present

## 2016-10-04 DIAGNOSIS — E1122 Type 2 diabetes mellitus with diabetic chronic kidney disease: Secondary | ICD-10-CM | POA: Insufficient documentation

## 2016-10-04 DIAGNOSIS — Z79899 Other long term (current) drug therapy: Secondary | ICD-10-CM | POA: Diagnosis not present

## 2016-10-04 DIAGNOSIS — R21 Rash and other nonspecific skin eruption: Secondary | ICD-10-CM

## 2016-10-04 DIAGNOSIS — R7309 Other abnormal glucose: Secondary | ICD-10-CM | POA: Diagnosis not present

## 2016-10-04 DIAGNOSIS — Z794 Long term (current) use of insulin: Secondary | ICD-10-CM | POA: Insufficient documentation

## 2016-10-04 DIAGNOSIS — Z888 Allergy status to other drugs, medicaments and biological substances status: Secondary | ICD-10-CM | POA: Insufficient documentation

## 2016-10-04 DIAGNOSIS — E119 Type 2 diabetes mellitus without complications: Secondary | ICD-10-CM | POA: Diagnosis not present

## 2016-10-04 LAB — GLUCOSE, CAPILLARY: Glucose-Capillary: 171 mg/dL — ABNORMAL HIGH (ref 65–99)

## 2016-10-04 MED ORDER — DOXYCYCLINE HYCLATE 100 MG PO TABS: 100.0000 mg | ORAL_TABLET | Freq: Two times a day (BID) | ORAL | 0 refills | Status: DC

## 2016-10-04 MED ORDER — TRIAMCINOLONE ACETONIDE 0.025 % EX CREA: 1.0000 "application " | TOPICAL_CREAM | Freq: Two times a day (BID) | CUTANEOUS | 0 refills | Status: DC

## 2016-10-04 NOTE — ED Notes (Signed)
FSBS 171

## 2016-10-04 NOTE — ED Provider Notes (Signed)
[facility]    CSN: [id number] Arrival date & time: 10/04/16  0850     History   Chief Complaint Chief Complaint  Patient presents with  . Pruritis    HPI Karen [name] is a 71 y.o. female.   The history is provided by the patient.  Rash  Location:  Head/neck and face Head/neck rash location:  L neck and R neck Facial rash location:  Forehead Quality: itchiness and redness   Quality: not blistering, not bruising, not burning, not draining, not dry, not painful, not peeling, not scaling, not swelling and not weeping   Onset quality:  Gradual Duration:  3 weeks Timing:  Constant Progression:  Worsening Chronicity:  New Context: not animal contact, not chemical exposure, not diapers, not eggs, not exposure to similar rash, not food, not hot tub use, not insect bite/sting, not medications, not new detergent/soap, not nuts, not plant contact, not pollen, not pregnancy, not sick contacts and not sun exposure   Ineffective treatments:  Anti-itch cream Associated symptoms: no abdominal pain, no diarrhea, no fatigue, no fever, no headaches, no hoarse voice, no induration, no joint pain, no myalgias, no nausea, no periorbital edema, no shortness of breath, no sore throat, no throat swelling, no tongue swelling, no URI, not vomiting and not wheezing     Past Medical History:  Diagnosis Date  . Anemia   . Cataract   . CHF (congestive heart failure) ([place])   . CKD (chronic kidney disease)   . COPD (chronic obstructive pulmonary disease) ([place])   . Diabetes mellitus without complication ([place])   . Hypertension     Patient Active Problem List   Diagnosis Date Noted  . Leucocytosis 10/19/2015  . 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 Acute on chronic respiratory failure with hypoxia and hypercapnia ([place]) 01/07/2015  . Sciatica 01/07/2015  . Weakness 01/07/2015    Past Surgical History:  Procedure Laterality Date  . CESAREAN SECTION    . CHOLECYSTECTOMY    . EYE SURGERY        OB History    No data available       Home Medications    Prior to Admission medications   Medication Sig Start Date End Date Taking? Authorizing Provider  aspirin EC 81 MG tablet Take 1 tablet by mouth daily.    [provider]  atorvastatin (LIPITOR) 10 MG tablet Take 1 tablet by mouth daily.    [provider]  benzonatate (TESSALON) 100 MG capsule Take 1 capsule (100 mg total) by mouth every 8 (eight) hours. 02/07/16   Norval Gable, MD  canagliflozin (INVOKANA) 100 MG TABS tablet Take 1 tablet by mouth daily.    [provider]  doxycycline (VIBRA-TABS) 100 MG tablet Take 1 tablet (100 mg total) by mouth 2 (two) times daily. 10/04/16   Norval Gable, MD  fluticasone furoate-vilanterol (BREO ELLIPTA) 100-25 MCG/INH AEPB Inhale 1 puff into the lungs daily.    [provider]  levofloxacin (LEVAQUIN) 500 MG tablet Take 1 tablet (500 mg total) by mouth daily. 02/07/16   Norval Gable, MD  losartan (COZAAR) 50 MG tablet Take 100 mg by mouth daily.     [provider]  losartan-hydrochlorothiazide (HYZAAR) 100-12.5 MG tablet Take 1 tablet by mouth daily.    [provider]  metFORMIN (GLUCOPHAGE) 500 MG tablet Take 500 mg by mouth 2 (two) times daily with a meal.    [provider]  montelukast (SINGULAIR) 10 MG tablet Take 10 mg  by mouth at bedtime.    [provider]  NOVOLOG 100 UNIT/ML injection Inject 15-25 Units into the skin 3 (three) times daily with meals.    [provider]  OXYGEN Place 3 L/min into the nose.     [provider]  triamcinolone (KENALOG) 0.025 % cream Apply 1 application topically 2 (two) times daily. 10/04/16   Norval Gable, MD    Family History Family History  Problem Relation Age of Onset  . Family history unknown: Yes    Social History Social History  Substance Use Topics  . Smoking status: Former Research scientist (life sciences)  . Smokeless tobacco: Never Used  . Alcohol use No      Allergies   Ace inhibitors; Gabapentin; Lisinopril; and Lyrica [pregabalin]   Review of Systems Review of Systems  Constitutional: Negative for fatigue and fever.  HENT: Negative for hoarse voice and sore throat.   Respiratory: Negative for shortness of breath and wheezing.   Gastrointestinal: Negative for abdominal pain, diarrhea, nausea and vomiting.  Musculoskeletal: Negative for arthralgias and myalgias.  Skin: Positive for rash.  Neurological: Negative for headaches.     Physical Exam Triage Vital Signs ED Triage Vitals [10/04/16 0901]  Enc Vitals Group     BP (!) 129/58     Pulse Rate 84     Resp (!) 22     Temp 98.4 F (36.9 C)     Temp Source Oral     SpO2 96 %     Weight 225 lb (102.1 kg)     Height 5\' 3"  (1.6 m)     Head Circumference      Peak Flow      Pain Score      Pain Loc      Pain Edu?      Excl. in [place]?    No data found.   Updated Vital Signs BP (!) 129/58 (BP Location: Right Arm)   Pulse 84   Temp 98.4 F (36.9 C) (Oral)   Resp (!) 22   Ht 5\' 3"  (1.6 m)   Wt 225 lb (102.1 kg)   SpO2 96%   BMI 39.86 kg/m   Visual Acuity Right Eye Distance:   Left Eye Distance:   Bilateral Distance:    Right Eye Near:   Left Eye Near:    Bilateral Near:     Physical Exam  Constitutional: She appears well-developed and well-nourished. No distress.  Skin: Rash (on neck, scalp, face) noted. Rash is papular and pustular. She is not diaphoretic.  Nursing note and vitals reviewed.    UC Treatments / Results  Labs (all labs ordered are listed, but only abnormal results are displayed) Labs Reviewed  GLUCOSE, CAPILLARY - Abnormal; Notable for the following:       Result Value   Glucose-Capillary 171 (*)    All other components within normal limits    EKG  EKG Interpretation None       Radiology No results found.  Procedures Procedures (including critical care time)  Medications Ordered in UC Medications - No data to  display   Initial Impression / Assessment and Plan / UC Course  I have reviewed the triage vital signs and the nursing notes.  Pertinent labs & imaging results that were available during my care of the patient were reviewed by me and considered in my medical decision making (see chart for details).       Final Clinical Impressions(s) / UC Diagnoses   Final  diagnoses:  Folliculitis  Boils    New Prescriptions Discharge Medication List as of 10/04/2016  9:26 AM    START taking these medications   Details  doxycycline (VIBRA-TABS) 100 MG tablet Take 1 tablet (100 mg total) by mouth 2 (two) times daily., Starting Wed 10/04/2016, Normal    triamcinolone (KENALOG) 0.025 % cream Apply 1 application topically 2 (two) times daily., Starting Wed 10/04/2016, Normal       1. diagnosis reviewed with patient 2. rx as per orders above; reviewed possible side effects, interactions, risks and benefits  3. Follow-up prn if symptoms worsen or don't improve   Norval Gable, MD 10/04/16 1340

## 2016-10-04 NOTE — ED Triage Notes (Signed)
Pt with itchy scalp, neck, and face x past 3 weeks. Also states her FSBS have been running higher than usual x past week.

## 2016-11-08 DIAGNOSIS — M48062 Spinal stenosis, lumbar region with neurogenic claudication: Secondary | ICD-10-CM | POA: Diagnosis not present

## 2016-11-08 DIAGNOSIS — M5416 Radiculopathy, lumbar region: Secondary | ICD-10-CM | POA: Diagnosis not present

## 2016-11-20 DIAGNOSIS — Z6841 Body Mass Index (BMI) 40.0 and over, adult: Secondary | ICD-10-CM | POA: Diagnosis not present

## 2016-11-20 DIAGNOSIS — E11649 Type 2 diabetes mellitus with hypoglycemia without coma: Secondary | ICD-10-CM | POA: Diagnosis not present

## 2016-11-20 DIAGNOSIS — M1712 Unilateral primary osteoarthritis, left knee: Secondary | ICD-10-CM | POA: Diagnosis not present

## 2016-11-20 DIAGNOSIS — Z794 Long term (current) use of insulin: Secondary | ICD-10-CM | POA: Diagnosis not present

## 2016-11-29 DIAGNOSIS — I10 Essential (primary) hypertension: Secondary | ICD-10-CM | POA: Diagnosis not present

## 2016-11-29 DIAGNOSIS — N183 Chronic kidney disease, stage 3 (moderate): Secondary | ICD-10-CM | POA: Diagnosis not present

## 2016-11-29 DIAGNOSIS — E7800 Pure hypercholesterolemia, unspecified: Secondary | ICD-10-CM | POA: Diagnosis not present

## 2016-12-14 ENCOUNTER — Emergency Department
Admission: EM | Admit: 2016-12-14 | Discharge: 2016-12-14 | Discharge status: Absent without leave | Payer: Medicare Other

## 2016-12-23 DIAGNOSIS — Z23 Encounter for immunization: Secondary | ICD-10-CM | POA: Diagnosis not present

## 2017-01-03 DIAGNOSIS — J439 Emphysema, unspecified: Secondary | ICD-10-CM | POA: Diagnosis not present

## 2017-01-03 DIAGNOSIS — J310 Chronic rhinitis: Secondary | ICD-10-CM | POA: Diagnosis not present

## 2017-01-11 DIAGNOSIS — E119 Type 2 diabetes mellitus without complications: Secondary | ICD-10-CM | POA: Diagnosis not present

## 2017-01-11 DIAGNOSIS — E538 Deficiency of other specified B group vitamins: Secondary | ICD-10-CM | POA: Diagnosis not present

## 2017-01-11 DIAGNOSIS — Z794 Long term (current) use of insulin: Secondary | ICD-10-CM | POA: Diagnosis not present

## 2017-02-01 DIAGNOSIS — M48062 Spinal stenosis, lumbar region with neurogenic claudication: Secondary | ICD-10-CM | POA: Diagnosis not present

## 2017-02-01 DIAGNOSIS — M5136 Other intervertebral disc degeneration, lumbar region: Secondary | ICD-10-CM | POA: Diagnosis not present

## 2017-02-01 DIAGNOSIS — M5416 Radiculopathy, lumbar region: Secondary | ICD-10-CM | POA: Diagnosis not present

## 2017-04-11 DIAGNOSIS — E669 Obesity, unspecified: Secondary | ICD-10-CM | POA: Insufficient documentation

## 2017-06-16 ENCOUNTER — Ambulatory Visit
Admission: EM | Admit: 2017-06-16 | Discharge: 2017-06-16 | Disposition: A | Discharge status: Home / Self Care | Payer: Medicare Other | Attending: Family Medicine | Admitting: Family Medicine

## 2017-06-16 ENCOUNTER — Other Ambulatory Visit: Payer: Self-pay

## 2017-06-16 DIAGNOSIS — J0100 Acute maxillary sinusitis, unspecified: Secondary | ICD-10-CM

## 2017-06-16 MED ORDER — DOXYCYCLINE HYCLATE 100 MG PO TABS: 100.0000 mg | ORAL_TABLET | Freq: Two times a day (BID) | ORAL | 0 refills | Status: DC

## 2017-06-16 NOTE — ED Provider Notes (Signed)
[facility]    CSN: [id number] Arrival date & time: 06/16/17  1004     History   Chief Complaint Chief Complaint  Patient presents with  . Facial Pain    HPI Karen [name] is a 72 y.o. female.   The history is provided by the patient.  URI  Presenting symptoms: congestion, cough and facial pain   Severity:  Moderate Onset quality:  Sudden Duration:  1 week Timing:  Constant Progression:  Worsening Chronicity:  New Relieved by:  None tried Ineffective treatments:  None tried Associated symptoms: headaches and sinus pain   Risk factors: being elderly, chronic respiratory disease, diabetes mellitus and sick contacts     Past Medical History:  Diagnosis Date  . Anemia   . Cataract   . CHF (congestive heart failure) ([place])   . CKD (chronic kidney disease)   . COPD (chronic obstructive pulmonary disease) ([place])   . Diabetes mellitus without complication ([place])   . Hypertension     Patient Active Problem List   Diagnosis Date Noted  . Leucocytosis 10/19/2015  . 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 Acute on chronic respiratory failure with hypoxia and hypercapnia ([place]) 01/07/2015  . Sciatica 01/07/2015  . Weakness 01/07/2015    Past Surgical History:  Procedure Laterality Date  . CESAREAN SECTION    . CHOLECYSTECTOMY    . EYE SURGERY      OB History   None      Home Medications    Prior to Admission medications   Medication Sig Start Date End Date Taking? Authorizing Provider  aspirin EC 81 MG tablet Take 1 tablet by mouth daily.    [provider]  atorvastatin (LIPITOR) 10 MG tablet Take 1 tablet by mouth daily.    [provider]  benzonatate (TESSALON) 100 MG capsule Take 1 capsule (100 mg total) by mouth every 8 (eight) hours. 02/07/16   Norval Gable, MD  canagliflozin (INVOKANA) 100 MG TABS tablet Take 1 tablet by mouth daily.    [provider]  doxycycline (VIBRA-TABS) 100 MG tablet Take 1 tablet (100 mg total)  by mouth 2 (two) times daily. 06/16/17   Norval Gable, MD  fluticasone furoate-vilanterol (BREO ELLIPTA) 100-25 MCG/INH AEPB Inhale 1 puff into the lungs daily.    [provider]  levofloxacin (LEVAQUIN) 500 MG tablet Take 1 tablet (500 mg total) by mouth daily. 02/07/16   Norval Gable, MD  losartan (COZAAR) 50 MG tablet Take 100 mg by mouth daily.     [provider]  losartan-hydrochlorothiazide (HYZAAR) 100-12.5 MG tablet Take 1 tablet by mouth daily.    [provider]  metFORMIN (GLUCOPHAGE) 500 MG tablet Take 500 mg by mouth 2 (two) times daily with a meal.    [provider]  montelukast (SINGULAIR) 10 MG tablet Take 10 mg by mouth at bedtime.    [provider]  NOVOLOG 100 UNIT/ML injection Inject 15-25 Units into the skin 3 (three) times daily with meals.    [provider]  OXYGEN Place 3 L/min into the nose.     [provider]  triamcinolone (KENALOG) 0.025 % cream Apply 1 application topically 2 (two) times daily. 10/04/16   Norval Gable, MD    Family History Family History  Family history unknown: Yes    Social History Social History   Tobacco Use  . Smoking status: Former Research scientist (life sciences)  . Smokeless tobacco: Never Used  Substance Use Topics  . Alcohol use: No  .  Drug use: No     Allergies   Ace inhibitors; Gabapentin; Lisinopril; and Lyrica [pregabalin]   Review of Systems Review of Systems  HENT: Positive for congestion and sinus pain.   Respiratory: Positive for cough.   Neurological: Positive for headaches.     Physical Exam Triage Vital Signs ED Triage Vitals  Enc Vitals Group     BP 06/16/17 1014 (!) 142/59     Pulse Rate 06/16/17 1014 87     Resp 06/16/17 1014 (!) 24     Temp 06/16/17 1014 98.3 F (36.8 C)     Temp Source 06/16/17 1014 Oral     SpO2 06/16/17 1014 96 %     Weight 06/16/17 1017 214 lb (97.1 kg)     Height 06/16/17 1017 5\' 3"  (1.6 m)     Head Circumference --      Peak  Flow --      Pain Score 06/16/17 1014 9     Pain Loc --      Pain Edu? --      Excl. in [place]? --    No data found.  Updated Vital Signs BP (!) 142/59 (BP Location: Right Arm)   Pulse 87   Temp 98.3 F (36.8 C) (Oral)   Resp (!) 24   Ht 5\' 3"  (1.6 m)   Wt 214 lb (97.1 kg)   SpO2 96%   BMI 37.91 kg/m   Visual Acuity Right Eye Distance:   Left Eye Distance:   Bilateral Distance:    Right Eye Near:   Left Eye Near:    Bilateral Near:     Physical Exam  Constitutional: She appears well-developed and well-nourished. No distress.  HENT:  Head: Normocephalic and atraumatic.  Right Ear: Tympanic membrane, external ear and ear canal normal.  Left Ear: Tympanic membrane, external ear and ear canal normal.  Nose: Mucosal edema and rhinorrhea present. No nose lacerations, sinus tenderness, nasal deformity, septal deviation or nasal septal hematoma. No epistaxis.  No foreign bodies. Right sinus exhibits maxillary sinus tenderness. Right sinus exhibits no frontal sinus tenderness. Left sinus exhibits maxillary sinus tenderness. Left sinus exhibits no frontal sinus tenderness.  Mouth/Throat: Uvula is midline, oropharynx is clear and moist and mucous membranes are normal. No oropharyngeal exudate.  Eyes: Pupils are equal, round, and reactive to light. Conjunctivae and EOM are normal. Right eye exhibits no discharge. Left eye exhibits no discharge. No scleral icterus.  Neck: Normal range of motion. Neck supple. No thyromegaly present.  Cardiovascular: Normal rate, regular rhythm and normal heart sounds.  Pulmonary/Chest: Effort normal and breath sounds normal. No respiratory distress. She has no wheezes. She has no rales.  Lymphadenopathy:    She has no cervical adenopathy.  Skin: She is not diaphoretic.  Nursing note and vitals reviewed.    UC Treatments / Results  Labs (all labs ordered are listed, but only abnormal results are displayed) Labs Reviewed - No data to  display  EKG None Radiology No results found.  Procedures Procedures (including critical care time)  Medications Ordered in UC Medications - No data to display   Initial Impression / Assessment and Plan / UC Course  I have reviewed the triage vital signs and the nursing notes.  Pertinent labs & imaging results that were available during my care of the patient were reviewed by me and considered in my medical decision making (see chart for details).       Final Clinical Impressions(s) / UC Diagnoses  Final diagnoses:  Acute maxillary sinusitis, recurrence not specified    ED Discharge Orders        Ordered    doxycycline (VIBRA-TABS) 100 MG tablet  2 times daily     06/16/17 1113     1. diagnosis reviewed with patient 2. rx as per orders above; reviewed possible side effects, interactions, risks and benefits  3. Recommend supportive treatment with rest, otc analgesics prn; continue current home medications 4. Follow-up prn if symptoms worsen or don't improve  Controlled Substance Prescriptions House Controlled Substance Registry consulted? Not Applicable   Norval Gable, MD 06/16/17 1158

## 2017-06-16 NOTE — ED Triage Notes (Signed)
Pt with facial pain/ headache, coughing non-productive. Denies nasal drainage. Pain 9/10

## 2017-06-29 ENCOUNTER — Ambulatory Visit: Payer: Self-pay | Admitting: Urology

## 2017-08-09 ENCOUNTER — Other Ambulatory Visit: Payer: Self-pay

## 2017-08-09 DIAGNOSIS — R32 Unspecified urinary incontinence: Secondary | ICD-10-CM

## 2017-08-09 NOTE — Progress Notes (Signed)
08/10/2017 12:15 PM   Karen [name] 09-Mar-1945 [id number]  Referring provider: Ricardo Jericho, NP [address] [place], [place] [address]  Chief Complaint  Patient presents with  . Urinary Incontinence    New Patient    HPI: Patient is a 72 -year-old Caucasian female who is referred to [place] by Ricardo Jericho, NP for urinary incontinence.  Patient states that she has had urinary incontinence for over one year.  She states that she stands up and it just pours out.  It is going through a whole pad of thick pads daily.   She is having incontinence with urgency and SUI.    She is having associated urinary frequency x "quite a lot", strong urgency and nocturia x 3-4.   She does not have a history of urinary tract infections or injury to the bladder.   She denies dysuria, gross hematuria, suprapubic pain, back pain, abdominal pain or flank pain.  She has not had any recent fevers, chills, nausea or vomiting.   She does not have a history of nephrolithiasis, GU surgery or GU trauma.   She is post menopausal.  She admits to diarrhea once in a while.    She is drinking 3 to 4 bottles of water daily.   She does not drink soda.  She does not drink tea.  No juices.  No alcohol.  She drinks two cups of coffee daily.    Her risk factors for incontinence are age, smoking (former), caffeine, diabetes and vaginal atrophy.    Her PVR is 25 mL.     PMH: Past Medical History:  Diagnosis Date  . Acute on chronic respiratory failure with hypoxia and hypercapnia ([place]) 01/07/2015  . Anemia   . 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 Cataract   . CHF (congestive heart failure) ([place])   . Chronic kidney disease 08/10/2017  . CKD (chronic kidney disease)   . COPD (chronic obstructive pulmonary disease) ([place])   . Diabetes mellitus without complication ([place])   . Edema, peripheral 04/20/2014  . Hypertension   . Iron deficiency anemia 06/22/2014  . Leucocytosis 10/19/2015  . Primary  osteoarthritis of right knee 09/01/2016  . Sciatica 01/07/2015    Surgical History: Past Surgical History:  Procedure Laterality Date  . CESAREAN SECTION    . CHOLECYSTECTOMY    . EYE SURGERY      Home Medications:  Allergies as of 08/10/2017      Reactions   Ace Inhibitors    Gabapentin Hives   Lisinopril    Lyrica [pregabalin]       Medication List        Accurate as of 08/10/17 12:15 PM. Always use your most recent med list.          aspirin EC 81 MG tablet Take 1 tablet by mouth daily.   atorvastatin 10 MG tablet Commonly known as:  LIPITOR Take 1 tablet by mouth daily.   BREO ELLIPTA 100-25 MCG/INH Aepb Generic drug:  fluticasone furoate-vilanterol Inhale 1 puff into the lungs daily.   INVOKANA 100 MG Tabs tablet Generic drug:  canagliflozin Take 1 tablet by mouth daily.   losartan 50 MG tablet Commonly known as:  COZAAR Take 100 mg by mouth daily.   losartan-hydrochlorothiazide 100-12.5 MG tablet Commonly known as:  HYZAAR Take 1 tablet by mouth daily.   metFORMIN 500 MG tablet Commonly known as:  GLUCOPHAGE Take 500 mg by mouth 2 (two) times daily with a meal.  montelukast 10 MG tablet Commonly known as:  SINGULAIR Take 10 mg by mouth at bedtime.   NOVOLOG 100 UNIT/ML injection Generic drug:  insulin aspart Inject 15-25 Units into the skin 3 (three) times daily with meals.   OXYGEN Place 3 L/min into the nose.       Allergies:  Allergies  Allergen Reactions  . Ace Inhibitors   . Gabapentin Hives  . Lisinopril   . Lyrica [Pregabalin]     Family History: Family History  Family history unknown: Yes    Social History:  reports that she has quit smoking. She has never used smokeless tobacco. She reports that she does not drink alcohol or use drugs.  ROS: UROLOGY Frequent Urination?: Yes Hard to postpone urination?: Yes Burning/pain with urination?: No Get up at night to urinate?: Yes Leakage of urine?: Yes Urine stream starts  and stops?: No Trouble starting stream?: No Do you have to strain to urinate?: No Blood in urine?: No Urinary tract infection?: No Sexually transmitted disease?: No Injury to kidneys or bladder?: No Painful intercourse?: No Weak stream?: No Currently pregnant?: No Vaginal bleeding?: No Last menstrual period?: n  Gastrointestinal Nausea?: No Vomiting?: No Indigestion/heartburn?: No Diarrhea?: No Constipation?: No  Constitutional Fever: No Night sweats?: No Weight loss?: No Fatigue?: No  Skin Skin rash/lesions?: No Itching?: No  Eyes Blurred vision?: No Double vision?: No  Ears/Nose/Throat Sore throat?: No Sinus problems?: No  Hematologic/Lymphatic Swollen glands?: No Easy bruising?: No  Cardiovascular Leg swelling?: No Chest pain?: No  Respiratory Cough?: No Shortness of breath?: No  Endocrine Excessive thirst?: No  Musculoskeletal Back pain?: No Joint pain?: No  Neurological Headaches?: No Dizziness?: No  Psychologic Depression?: No Anxiety?: No  Physical Exam: BP 132/75   Pulse 80   Weight 220lbs Constitutional: Well nourished. Alert and oriented, No acute distress. HEENT: [place] AT, moist mucus membranes. Trachea midline, no masses. Cardiovascular: No clubbing, cyanosis, or edema. Respiratory: Normal respiratory effort, no increased work of breathing. GI: Abdomen is soft, non tender, non distended, no abdominal masses. Liver and spleen not palpable.  No hernias appreciated.  Stool sample for occult testing is not indicated.   GU: No CVA tenderness.  No bladder fullness or masses.  Atrophic external genitalia, normal pubic hair distribution, no lesions.  Normal urethral meatus, no lesions, no prolapse, no discharge.   No urethral masses, tenderness and/or tenderness. No bladder fullness, tenderness or masses. Pale vagina mucosa, poor estrogen effect, no discharge, no lesions, good pelvic support, no cystocele or rectocele noted.  No cervical  motion tenderness.  Uterus is fixed or adipose tissue is not allowing for complete exam.  Could not palpate adnexa due to body habitus.   Anus and perineum are without rashes or lesions.    Skin: No rashes, bruises or suspicious lesions. Lymph: No cervical or inguinal adenopathy. Neurologic: Grossly intact, no focal deficits, moving all 4 extremities. Psychiatric: Normal mood and affect.  Laboratory Data: Lab Results  Component Value Date   WBC 14.3 (H) 06/23/2016   HGB 12.9 06/23/2016   HCT 39.4 06/23/2016   MCV 89.6 06/23/2016   PLT 228 06/23/2016    Lab Results  Component Value Date   CREATININE 0.66 06/23/2016    No results found for: PSA  No results found for: TESTOSTERONE  Lab Results  Component Value Date   HGBA1C 6.6 (H) 01/07/2015    No results found for: TSH  No results found for: CHOL, HDL, CHOLHDL, VLDL, LDLCALC  Lab Results  Component Value Date   AST 14 (L) 10/19/2015   Lab Results  Component Value Date   ALT 11 (L) 10/19/2015   No components found for: ALKALINEPHOPHATASE No components found for: BILIRUBINTOTAL  No results found for: ESTRADIOL  Urinalysis    Component Value Date/Time   COLORURINE STRAW (A) 06/23/2016 [place] 06/23/2016 1051   APPEARANCEUR [place] 04/02/2014 0517   LABSPEC 1.015 06/23/2016 1051   LABSPEC 1.024 04/02/2014 0517   PHURINE 5.5 06/23/2016 1051   GLUCOSEU 500 (A) 06/23/2016 1051   GLUCOSEU >=500 04/02/2014 0517   HGBUR NEGATIVE 06/23/2016 1051   BILIRUBINUR NEGATIVE 06/23/2016 1051   BILIRUBINUR Negative 04/02/2014 [place] 06/23/2016 1051   PROTEINUR NEGATIVE 06/23/2016 1051   NITRITE NEGATIVE 06/23/2016 1051   LEUKOCYTESUR NEGATIVE 06/23/2016 1051   LEUKOCYTESUR Trace 04/02/2014 0517    I have reviewed the labs.   Pertinent Imaging: Results for QUIANA, COBAUGH (MRN [id number]) as of 08/10/2017 12:02  Ref. Range 08/10/2017 11:31  Scan Result Unknown 82ml      Assessment & Plan:    1. Incontinence Offered medical therapy with beta-3 adrenergic receptor agonist and the potential side effects of each therapy - would like to try the beta-3 adrenergic receptor agonist (Myrbetriq).  Given Myrbetriq 25 mg samples, #28.  I have reviewed with the patient of the side effects of Myrbetriq, such as: elevation in BP, urinary retention and/or HA.   RTC in 3 weeks for PVR and OAB questionnaire   2. Uterine mass Fibroid vs cancer vs adipose tissue inhibiting exam - she denies any vaginal bleeding Will obtain a pelvic ultrasound for further evaluation  3. Vaginal atrophy Will hold prescribing vaginal estrogen cream until pelvic ultrasound results are available   Return in about 3 weeks (around 08/31/2017) for PVR and OAB questionnaire.  These notes generated with voice recognition software. I apologize for typographical errors.  Zara Council, [facility] [address] [place], [place] [address] [phone]

## 2017-08-10 ENCOUNTER — Ambulatory Visit (INDEPENDENT_AMBULATORY_CARE_PROVIDER_SITE_OTHER): Payer: Medicare Other | Admitting: Urology

## 2017-08-10 ENCOUNTER — Encounter: Payer: Self-pay | Admitting: Urology

## 2017-08-10 VITALS — BP 132/75 | HR 80 | Ht 63.0 in | Wt 220.0 lb

## 2017-08-10 DIAGNOSIS — M1990 Unspecified osteoarthritis, unspecified site: Secondary | ICD-10-CM | POA: Insufficient documentation

## 2017-08-10 DIAGNOSIS — N189 Chronic kidney disease, unspecified: Secondary | ICD-10-CM | POA: Insufficient documentation

## 2017-08-10 DIAGNOSIS — N952 Postmenopausal atrophic vaginitis: Secondary | ICD-10-CM | POA: Diagnosis not present

## 2017-08-10 DIAGNOSIS — I10 Essential (primary) hypertension: Secondary | ICD-10-CM | POA: Insufficient documentation

## 2017-08-10 DIAGNOSIS — N858 Other specified noninflammatory disorders of uterus: Secondary | ICD-10-CM

## 2017-08-10 DIAGNOSIS — N859 Noninflammatory disorder of uterus, unspecified: Secondary | ICD-10-CM | POA: Diagnosis not present

## 2017-08-10 DIAGNOSIS — R32 Unspecified urinary incontinence: Secondary | ICD-10-CM | POA: Diagnosis not present

## 2017-08-10 DIAGNOSIS — J449 Chronic obstructive pulmonary disease, unspecified: Secondary | ICD-10-CM | POA: Insufficient documentation

## 2017-08-10 DIAGNOSIS — E119 Type 2 diabetes mellitus without complications: Secondary | ICD-10-CM | POA: Insufficient documentation

## 2017-08-10 DIAGNOSIS — N184 Chronic kidney disease, stage 4 (severe): Secondary | ICD-10-CM | POA: Insufficient documentation

## 2017-08-10 LAB — BLADDER SCAN AMB NON-IMAGING

## 2017-08-21 ENCOUNTER — Ambulatory Visit
Admission: RE | Admit: 2017-08-21 | Discharge: 2017-08-21 | Disposition: A | Discharge status: Home / Self Care | Payer: Medicare Other | Source: Ambulatory Visit | Attending: Urology | Admitting: Urology

## 2017-08-21 DIAGNOSIS — N859 Noninflammatory disorder of uterus, unspecified: Secondary | ICD-10-CM | POA: Diagnosis not present

## 2017-08-21 DIAGNOSIS — N858 Other specified noninflammatory disorders of uterus: Secondary | ICD-10-CM

## 2017-08-26 ENCOUNTER — Other Ambulatory Visit: Payer: Self-pay

## 2017-08-26 ENCOUNTER — Encounter: Payer: Self-pay | Admitting: Emergency Medicine

## 2017-08-26 ENCOUNTER — Observation Stay
Admission: EM | Admit: 2017-08-26 | Discharge: 2017-08-30 | Disposition: A | Discharge status: Home / Self Care | Payer: Medicare Other | Attending: Internal Medicine | Admitting: Internal Medicine

## 2017-08-26 DIAGNOSIS — Z9049 Acquired absence of other specified parts of digestive tract: Secondary | ICD-10-CM | POA: Insufficient documentation

## 2017-08-26 DIAGNOSIS — K922 Gastrointestinal hemorrhage, unspecified: Secondary | ICD-10-CM | POA: Diagnosis present

## 2017-08-26 DIAGNOSIS — Z79899 Other long term (current) drug therapy: Secondary | ICD-10-CM | POA: Diagnosis not present

## 2017-08-26 DIAGNOSIS — Z794 Long term (current) use of insulin: Secondary | ICD-10-CM | POA: Insufficient documentation

## 2017-08-26 DIAGNOSIS — J449 Chronic obstructive pulmonary disease, unspecified: Secondary | ICD-10-CM | POA: Insufficient documentation

## 2017-08-26 DIAGNOSIS — D62 Acute posthemorrhagic anemia: Secondary | ICD-10-CM | POA: Insufficient documentation

## 2017-08-26 DIAGNOSIS — K5731 Diverticulosis of large intestine without perforation or abscess with bleeding: Secondary | ICD-10-CM | POA: Diagnosis not present

## 2017-08-26 DIAGNOSIS — N183 Chronic kidney disease, stage 3 (moderate): Secondary | ICD-10-CM | POA: Insufficient documentation

## 2017-08-26 DIAGNOSIS — Z7982 Long term (current) use of aspirin: Secondary | ICD-10-CM | POA: Diagnosis not present

## 2017-08-26 DIAGNOSIS — Z6841 Body Mass Index (BMI) 40.0 and over, adult: Secondary | ICD-10-CM | POA: Insufficient documentation

## 2017-08-26 DIAGNOSIS — I509 Heart failure, unspecified: Secondary | ICD-10-CM | POA: Diagnosis not present

## 2017-08-26 DIAGNOSIS — E785 Hyperlipidemia, unspecified: Secondary | ICD-10-CM | POA: Diagnosis not present

## 2017-08-26 DIAGNOSIS — Z87891 Personal history of nicotine dependence: Secondary | ICD-10-CM | POA: Insufficient documentation

## 2017-08-26 DIAGNOSIS — E1122 Type 2 diabetes mellitus with diabetic chronic kidney disease: Secondary | ICD-10-CM | POA: Diagnosis not present

## 2017-08-26 DIAGNOSIS — M1711 Unilateral primary osteoarthritis, right knee: Secondary | ICD-10-CM | POA: Insufficient documentation

## 2017-08-26 DIAGNOSIS — I130 Hypertensive heart and chronic kidney disease with heart failure and stage 1 through stage 4 chronic kidney disease, or unspecified chronic kidney disease: Secondary | ICD-10-CM | POA: Diagnosis not present

## 2017-08-26 DIAGNOSIS — Z888 Allergy status to other drugs, medicaments and biological substances status: Secondary | ICD-10-CM | POA: Insufficient documentation

## 2017-08-26 DIAGNOSIS — M5430 Sciatica, unspecified side: Secondary | ICD-10-CM | POA: Insufficient documentation

## 2017-08-26 DIAGNOSIS — Z9981 Dependence on supplemental oxygen: Secondary | ICD-10-CM | POA: Diagnosis not present

## 2017-08-26 HISTORY — DX: Disorder of kidney and ureter, unspecified: N28.9

## 2017-08-26 LAB — COMPREHENSIVE METABOLIC PANEL
ALBUMIN: 3.8 g/dL (ref 3.5–5.0)
ALK PHOS: 46 U/L (ref 38–126)
ALT: 14 U/L (ref 14–54)
AST: 15 U/L (ref 15–41)
Anion gap: 10 (ref 5–15)
BUN: 24 mg/dL — ABNORMAL HIGH (ref 6–20)
CALCIUM: 9.1 mg/dL (ref 8.9–10.3)
CO2: 27 mmol/L (ref 22–32)
CREATININE: 0.98 mg/dL (ref 0.44–1.00)
Chloride: 98 mmol/L — ABNORMAL LOW (ref 101–111)
GFR calc non Af Amer: 56 mL/min — ABNORMAL LOW (ref >60)
GLUCOSE: 175 mg/dL — AB (ref 65–99)
Potassium: 4.5 mmol/L (ref 3.5–5.1)
SODIUM: 135 mmol/L (ref 135–145)
Total Bilirubin: 0.6 mg/dL (ref 0.3–1.2)
Total Protein: 6.9 g/dL (ref 6.5–8.1)

## 2017-08-26 LAB — TYPE AND SCREEN
ABO/RH(D): A NEG
Antibody Screen: NEGATIVE

## 2017-08-26 LAB — CBC
HCT: 36.6 % (ref 35.0–47.0)
Hemoglobin: 12.3 g/dL (ref 12.0–16.0)
MCH: 30.7 pg (ref 26.0–34.0)
MCHC: 33.7 g/dL (ref 32.0–36.0)
MCV: 91.2 fL (ref 80.0–100.0)
PLATELETS: 272 10*3/uL (ref 150–440)
RBC: 4.02 MIL/uL (ref 3.80–5.20)
RDW: 15 % — ABNORMAL HIGH (ref 11.5–14.5)
WBC: 13.7 10*3/uL — ABNORMAL HIGH (ref 3.6–11.0)

## 2017-08-26 LAB — GLUCOSE, CAPILLARY: Glucose-Capillary: 194 mg/dL — ABNORMAL HIGH (ref 65–99)

## 2017-08-26 LAB — HEMOGLOBIN AND HEMATOCRIT, BLOOD
HCT: 35.9 % (ref 35.0–47.0)
Hemoglobin: 12 g/dL (ref 12.0–16.0)

## 2017-08-26 MED ORDER — PANTOPRAZOLE SODIUM 40 MG IV SOLR: 40.0000 mg | Freq: Two times a day (BID) | INTRAVENOUS | Status: DC

## 2017-08-26 MED ORDER — MORPHINE SULFATE (PF) 2 MG/ML IV SOLN
2.0000 mg | Freq: Four times a day (QID) | INTRAVENOUS | Status: DC | PRN
  Administered 2017-08-26: 2 mg via INTRAVENOUS
  Filled 2017-08-26: qty 1

## 2017-08-26 MED ORDER — TRAMADOL HCL 50 MG PO TABS: 50.0000 mg | ORAL_TABLET | Freq: Four times a day (QID) | ORAL | Status: DC | PRN

## 2017-08-26 MED ORDER — ORAL CARE MOUTH RINSE
15.0000 mL | Freq: Two times a day (BID) | OROMUCOSAL | Status: DC
  Administered 2017-08-26 – 2017-08-29 (×5): 15 mL via OROMUCOSAL

## 2017-08-26 MED ORDER — SODIUM CHLORIDE 0.9 % IV SOLN
8.0000 mg/h | INTRAVENOUS | Status: AC
  Administered 2017-08-26 – 2017-08-29 (×6): 8 mg/h via INTRAVENOUS
  Filled 2017-08-26 (×8): qty 80

## 2017-08-26 MED ORDER — SODIUM CHLORIDE 0.9 % IV SOLN
80.0000 mg | Freq: Once | INTRAVENOUS | Status: AC
  Administered 2017-08-26: 80 mg via INTRAVENOUS
  Filled 2017-08-26: qty 80

## 2017-08-26 MED ORDER — ONDANSETRON HCL 4 MG/2ML IJ SOLN
4.0000 mg | Freq: Four times a day (QID) | INTRAMUSCULAR | Status: DC | PRN
  Administered 2017-08-27 (×2): 4 mg via INTRAVENOUS
  Filled 2017-08-26 (×2): qty 2

## 2017-08-26 MED ORDER — SODIUM CHLORIDE 0.9 % IV SOLN
INTRAVENOUS | Status: AC
  Administered 2017-08-26: 23:00:00 via INTRAVENOUS

## 2017-08-26 MED ORDER — FLUTICASONE FUROATE-VILANTEROL 100-25 MCG/INH IN AEPB
1.0000 | INHALATION_SPRAY | Freq: Every day | RESPIRATORY_TRACT | Status: DC
  Administered 2017-08-27 – 2017-08-29 (×3): 1 via RESPIRATORY_TRACT
  Filled 2017-08-26: qty 28

## 2017-08-26 MED ORDER — ONDANSETRON HCL 4 MG PO TABS: 4.0000 mg | ORAL_TABLET | Freq: Four times a day (QID) | ORAL | Status: DC | PRN

## 2017-08-26 NOTE — Progress Notes (Signed)
Family Meeting Note  Advance Directive:yes  Today a meeting took place with the Patient.    The following clinical team members were present during this meeting:MD  The following were discussed:Patient's diagnosis: GI bleed abdominal pain treatment plan of care discussed in detail with the patient.  She verbalized understanding of the plan.  Other comorbidities as documented below are also discussed with the patient.   Acute on chronic respiratory failure with hypoxia and hypercapnia ([place]) 01/07/2015   . Anemia   . 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 Cataract   . CHF (congestive heart failure) ([place])   . Chronic kidney disease 08/10/2017  . CKD (chronic kidney disease)   . COPD (chronic obstructive pulmonary disease) ([place])   . Diabetes mellitus without complication ([place])   . Edema, peripheral 04/20/2014  . Hypertension   . Iron deficiency anemia 06/22/2014  . Leucocytosis 10/19/2015  . Primary osteoarthritis of right knee 09/01/2016  . Sciatica 01/07/2015        Patient's progosis: Unable to determine and Goals for treatment: Full Code, husband is the healthcare POA  Additional follow-up to be provided: Hospitalist and gastroenterology  Time spent during discussion:18 min  Nicholes Mango, MD

## 2017-08-26 NOTE — H&P (Signed)
[facility] [place] [facility] [place] NAME: Karen [name]    MR#:  [id number]  DATE OF BIRTH:  October 02, 1945  DATE OF ADMISSION:  08/26/2017  PRIMARY CARE PHYSICIAN: Ricardo Jericho, NP   REQUESTING/REFERRING PHYSICIAN: Archie Balboa  CHIEF COMPLAINT:  Rectal bleeding  HISTORY OF PRESENT ILLNESS:  Karen [name]  is a 72 y.o. female with a known history of insulin requiring diabetes metas, hypertension, hyperlipidemia, chronic CHF, chronic COPD and other medical problems is presenting to the ED with a chief complaint of rectal bleeding.  Patient had 3 episodes of bright red blood per rectum today and patient came into the emergency department.  In the ED she had another episode of bloody bowel movement.  Never had similar complaints in the past never had any EGD or colonoscopy done.  Not on NSAIDs reporting lower abdominal pain  PAST MEDICAL HISTORY:   Past Medical History:  Diagnosis Date  . Acute on chronic respiratory failure with hypoxia and hypercapnia ([place]) 01/07/2015  . Anemia   . 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 Cataract   . CHF (congestive heart failure) ([place])   . Chronic kidney disease 08/10/2017  . CKD (chronic kidney disease)   . COPD (chronic obstructive pulmonary disease) ([place])   . Diabetes mellitus without complication ([place])   . Edema, peripheral 04/20/2014  . Hypertension   . Iron deficiency anemia 06/22/2014  . Leucocytosis 10/19/2015  . Primary osteoarthritis of right knee 09/01/2016  . Sciatica 01/07/2015    PAST SURGICAL HISTOIRY:   Past Surgical History:  Procedure Laterality Date  . CESAREAN SECTION    . CHOLECYSTECTOMY    . EYE SURGERY      SOCIAL HISTORY:   Social History   Tobacco Use  . Smoking status: Former Research scientist (life sciences)  . Smokeless tobacco: Never Used  Substance Use Topics  . Alcohol use: No    FAMILY HISTORY:   Family History  Family history unknown: Yes    DRUG ALLERGIES:   Allergies  Allergen Reactions  .  Ace Inhibitors   . Gabapentin Hives  . Lisinopril   . Lyrica [Pregabalin]     REVIEW OF SYSTEMS:  CONSTITUTIONAL: No fever, fatigue or weakness.  EYES: No blurred or double vision.  EARS, NOSE, AND THROAT: No tinnitus or ear pain.  RESPIRATORY: No cough, shortness of breath, wheezing or hemoptysis.  CARDIOVASCULAR: No chest pain, orthopnea, edema.  GASTROINTESTINAL: No nausea, vomiting, diarrhea lower abdominal pain and rectal bleeding.  Reporting GENITOURINARY: No dysuria, hematuria.  ENDOCRINE: No polyuria, nocturia,  HEMATOLOGY: No anemia, easy bruising or bleeding SKIN: No rash or lesion. MUSCULOSKELETAL: No joint pain or arthritis.   NEUROLOGIC: No tingling, numbness, weakness.  PSYCHIATRY: No anxiety or depression.   MEDICATIONS AT HOME:   Prior to Admission medications   Medication Sig Start Date End Date Taking? Authorizing Provider  aspirin EC 81 MG tablet Take 1 tablet by mouth daily.   Yes [provider]  atorvastatin (LIPITOR) 10 MG tablet Take 1 tablet by mouth daily.   Yes [provider]  canagliflozin (INVOKANA) 100 MG TABS tablet Take 1 tablet by mouth daily.   Yes [provider]  fluticasone furoate-vilanterol (BREO ELLIPTA) 100-25 MCG/INH AEPB Inhale 1 puff into the lungs daily.   Yes [provider]  HUMALOG 100 UNIT/ML injection Inject 76 Units into the skin daily. In VGO device 08/19/17  Yes [provider]  hydrochlorothiazide (HYDRODIURIL) 25 MG tablet Take 25 mg by mouth daily.  08/10/17  Yes [provider]  Insulin Disposable Pump (V-GO 40) KIT Inject into the skin as directed. 08/14/17  Yes [provider]  metFORMIN (GLUCOPHAGE) 500 MG tablet Take 1,000 mg by mouth 2 (two) times daily with a meal.    Yes [provider]  montelukast (SINGULAIR) 10 MG tablet Take 10 mg by mouth at bedtime.   Yes [provider]  OXYGEN Place 3 L/min into the nose.     [provider]       VITAL SIGNS:  Blood pressure (!) 156/69, pulse 87, temperature 98 F (36.7 C), temperature source Oral, resp. rate 19, height '5\' 3"'  (1.6 m), weight 103.4 kg (228 lb), SpO2 94 %.  PHYSICAL EXAMINATION:  GENERAL:  72 y.o.-year-old patient lying in the bed with no acute distress.  EYES: Pupils equal, round, reactive to light and accommodation. No scleral icterus. Extraocular muscles intact.  HEENT: Head atraumatic, normocephalic. Oropharynx and nasopharynx clear.  NECK:  Supple, no jugular venous distention. No thyroid enlargement, no tenderness.  LUNGS: Normal breath sounds bilaterally, no wheezing, rales,rhonchi or crepitation. No use of accessory muscles of respiration.  CARDIOVASCULAR: S1, S2 normal. No murmurs, rubs, or gallops.  ABDOMEN: Soft,  lower abdomen is tender but no rebound tenderness is present nondistended. Bowel sounds present.   EXTREMITIES: No pedal edema, cyanosis, or clubbing.  NEUROLOGIC: Cranial nerves II through XII are intact. Muscle strength 5/5 in all extremities. Sensation intact. Gait not checked.  PSYCHIATRIC: The patient is alert and oriented x 3.  SKIN: No obvious rash, lesion, or ulcer.   LABORATORY PANEL:   CBC Recent Labs  Lab 08/26/17 1639 08/26/17 2049  WBC 13.7*  --   HGB 12.3 12.0  HCT 36.6 35.9  PLT 272  --    ------------------------------------------------------------------------------------------------------------------  Chemistries  Recent Labs  Lab 08/26/17 1639  NA 135  K 4.5  CL 98*  CO2 27  GLUCOSE 175*  BUN 24*  CREATININE 0.98  CALCIUM 9.1  AST 15  ALT 14  ALKPHOS 46  BILITOT 0.6   ------------------------------------------------------------------------------------------------------------------  Cardiac Enzymes No results for input(s): TROPONINI in the last 168 hours. ------------------------------------------------------------------------------------------------------------------  RADIOLOGY:  No results  found.  EKG:  No orders found for this or any previous visit.  IMPRESSION AND PLAN:  Karen [name]  is a 71 y.o. female with a known history of insulin requiring diabetes metas, hypertension, hyperlipidemia, chronic CHF, chronic COPD and other medical problems is presenting to the ED with a chief complaint of rectal bleeding.  Patient had 3 episodes of bright red blood per rectum today and patient came into the emergency departmen   #GI bleed rectal Admit to MedSurg unit N.p.o. after midnight PPI Monitor hemoglobin hematocrit every 6 hours.  At this point patient is hemodynamically stable.  PRBC transition as needed Hydrate with IV fluids GI consult placed Holding aspirin  # insulin requiring diabetes mellitus Currently patient is n.p.o. provide sliding scale insulin  #Essential hypertension; currently patient is n.p.o. IV Lopressor as needed  #Chronic CHF no fluid not fluid overloaded at this time  #COPD no exacerbation provide nebulizer treatments as needed  #Hyperlipidemia continue statin once patient is tolerating diet   GI prophylaxis with Protonix DVT perplexes with SCDs All the records are reviewed and case discussed with ED provider. Management plans discussed with the patient, family and they are in agreement.  CODE STATUS: fc   TOTAL TIME TAKING CARE OF THIS PATIENT: 41  minutes.   Note:  This dictation was prepared with Dragon dictation along with smaller phrase technology. Any transcriptional errors that result from this process are unintentional.  Nicholes Mango M.D on 08/26/2017 at 9:35 PM  Between 7am to 6pm - Pager - [phone]  After 6pm go to [email or web address] - password [email or web address] [facility]  [facility]  Office  [phone]  CC: Primary care physician; Ricardo Jericho, NP

## 2017-08-26 NOTE — ED Notes (Signed)
Awaiting meds from pharmacy at this time. Will inform hand off RN

## 2017-08-26 NOTE — ED Notes (Signed)
First Nurse Note: Pt to ED c/o rectal bleeding. Pt is in NAD at this time.

## 2017-08-26 NOTE — ED Triage Notes (Signed)
Patient presents to the ED with bright red rectal bleeding x 3 in the past hour.  Patient denies history of the same.  Patient states, "I filled the toilet up with blood."  Patient appears pale a this time.

## 2017-08-26 NOTE — ED Provider Notes (Signed)
[facility] Emergency Department Provider Note   ____________________________________________   I have reviewed the triage vital signs and the nursing notes.   HISTORY  Chief Complaint Rectal Bleeding   History limited by: Not Limited   HPI Karen [name] is a 72 y.o. female who presents to the emergency department today because of concerns for rectal bleeding.  The symptoms started this afternoon.  Patient states she had 3 episodes at home.  Was bright red.  She states it would fill the toilet.  This was accompanied by some lower abdominal pain and discomfort.  While here in the emergency department she had another bloody bowel movement.  She denies similar symptoms in the past.  Denies history of hemorrhoids or straining.  Denies having undergone colonoscopy in the past.  Denies any history of intestinal disorder for herself or in her family.   Per medical record review patient has a history of CHF, CKD, COPD.  Past Medical History:  Diagnosis Date  . Acute on chronic respiratory failure with hypoxia and hypercapnia ([place]) 01/07/2015  . Anemia   . 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 Cataract   . CHF (congestive heart failure) ([place])   . Chronic kidney disease 08/10/2017  . CKD (chronic kidney disease)   . COPD (chronic obstructive pulmonary disease) ([place])   . Diabetes mellitus without complication ([place])   . Edema, peripheral 04/20/2014  . Hypertension   . Iron deficiency anemia 06/22/2014  . Leucocytosis 10/19/2015  . Primary osteoarthritis of right knee 09/01/2016  . Sciatica 01/07/2015    Patient Active Problem List   Diagnosis Date Noted  . Arthritis 08/10/2017  . Chronic kidney disease 08/10/2017  . COPD (chronic obstructive pulmonary disease) ([place]) 08/10/2017  . Diabetes mellitus type 2, uncomplicated ([place]) 07/37/1062  . Hypertension 08/10/2017  . Obesity (BMI 35.0-39.9 without comorbidity) 04/11/2017  . Primary osteoarthritis of right knee 09/01/2016   . Leucocytosis 10/19/2015  . 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 Acute on chronic respiratory failure with hypoxia and hypercapnia ([place]) 01/07/2015  . Sciatica 01/07/2015  . Weakness 01/07/2015  . 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 Iron deficiency anemia 06/22/2014  . Microalbuminuria 06/22/2014  . CHF (congestive heart failure) ([place]) 04/20/2014  . Edema, peripheral 04/20/2014    Past Surgical History:  Procedure Laterality Date  . CESAREAN SECTION    . CHOLECYSTECTOMY    . EYE SURGERY      Prior to Admission medications   Medication Sig Start Date End Date Taking? Authorizing Provider  aspirin EC 81 MG tablet Take 1 tablet by mouth daily.    [provider]  atorvastatin (LIPITOR) 10 MG tablet Take 1 tablet by mouth daily.    [provider]  canagliflozin (INVOKANA) 100 MG TABS tablet Take 1 tablet by mouth daily.    [provider]  fluticasone furoate-vilanterol (BREO ELLIPTA) 100-25 MCG/INH AEPB Inhale 1 puff into the lungs daily.    [provider]  losartan (COZAAR) 50 MG tablet Take 100 mg by mouth daily.     [provider]  losartan-hydrochlorothiazide (HYZAAR) 100-12.5 MG tablet Take 1 tablet by mouth daily.    [provider]  metFORMIN (GLUCOPHAGE) 500 MG tablet Take 500 mg by mouth 2 (two) times daily with a meal.    [provider]  montelukast (SINGULAIR) 10 MG tablet Take 10 mg by mouth at bedtime.    [provider]  NOVOLOG 100 UNIT/ML injection Inject 15-25 Units into the  skin 3 (three) times daily with meals.    [provider]  OXYGEN Place 3 L/min into the nose.     [provider]    Allergies Ace inhibitors; Gabapentin; Lisinopril; and Lyrica [pregabalin]  Family History  Family history unknown: Yes    Social History Social History   Tobacco Use  . Smoking status: Former Research scientist (life sciences)  . Smokeless tobacco: Never Used  Substance Use Topics  .  Alcohol use: No  . Drug use: No    Review of Systems Constitutional: No fever/chills Eyes: No visual changes. ENT: No sore throat. Cardiovascular: Denies chest pain. Respiratory: Denies shortness of breath. Gastrointestinal: Positive for rectal bleeding. Genitourinary: Negative for dysuria. Musculoskeletal: Negative for back pain. Skin: Negative for rash. Neurological: Negative for headaches, focal weakness or numbness.  ____________________________________________   PHYSICAL EXAM:  VITAL SIGNS: ED Triage Vitals  Enc Vitals Group     BP 08/26/17 1626 (!) 130/57     Pulse Rate 08/26/17 1626 89     Resp --      Temp 08/26/17 1626 98.1 F (36.7 C)     Temp Source 08/26/17 1626 Oral     SpO2 08/26/17 1626 93 %     Weight 08/26/17 1627 228 lb (103.4 kg)     Height 08/26/17 1627 5\' 3"  (1.6 m)     Head Circumference --      Peak Flow --      Pain Score 08/26/17 1627 10   Constitutional: Alert and oriented.  Eyes: Conjunctivae are normal.  ENT      Head: Normocephalic and atraumatic.      Nose: No congestion/rhinnorhea.      Mouth/Throat: Mucous membranes are moist.      Neck: No stridor. Hematological/Lymphatic/Immunilogical: No cervical lymphadenopathy. Cardiovascular: Normal rate, regular rhythm.  No murmurs, rubs, or gallops.  Respiratory: Normal respiratory effort without tachypnea nor retractions. Breath sounds are clear and equal bilaterally. No wheezes/rales/rhonchi. Gastrointestinal: Soft and somewhat diffusely tender to palpation. No rebound. No guarding.  Genitourinary: Deferred Musculoskeletal: Normal range of motion in all extremities. No lower extremity edema. Neurologic:  Normal speech and language. No gross focal neurologic deficits are appreciated.  Skin:  Skin is warm, dry and intact. No rash noted. Psychiatric: Mood and affect are normal. Speech and behavior are normal. Patient exhibits appropriate insight and  judgment.  ____________________________________________    LABS (pertinent positives/negatives)  CBC wbc 13.7, hgb 12.3, plt 272 CMP na 135, k 4.5, glu 175, cr 0.98  ____________________________________________   EKG  None  ____________________________________________    RADIOLOGY  None  ____________________________________________   PROCEDURES  Procedures  ____________________________________________   INITIAL IMPRESSION / ASSESSMENT AND PLAN / ED COURSE  Pertinent labs & imaging results that were available during my care of the patient were reviewed by me and considered in my medical decision making (see chart for details).   Patient presented to the emergency department today because of concerns for rectal bleeding.  Differential be broad including anal fissures, hemorrhoids, diverticulosis, AVMs, cancer, ulcers amongst other etiologies.  Patient's vital signs were normal here in the emergency department.  Very mild leukocytosis.  Will plan on admission for observation.  Discussed findings and plan with patient.   ____________________________________________   FINAL CLINICAL IMPRESSION(S) / ED DIAGNOSES  Final diagnoses:  Acute GI bleeding     Note: This dictation was prepared with Dragon dictation. Any transcriptional errors that result from this process are unintentional     Nance Pear,  MD 08/26/17 9996

## 2017-08-27 ENCOUNTER — Encounter: Payer: Self-pay | Admitting: Radiology

## 2017-08-27 ENCOUNTER — Inpatient Hospital Stay: Payer: Medicare Other

## 2017-08-27 DIAGNOSIS — K5731 Diverticulosis of large intestine without perforation or abscess with bleeding: Secondary | ICD-10-CM | POA: Diagnosis not present

## 2017-08-27 DIAGNOSIS — K922 Gastrointestinal hemorrhage, unspecified: Secondary | ICD-10-CM | POA: Diagnosis not present

## 2017-08-27 LAB — HEMOGLOBIN AND HEMATOCRIT, BLOOD
HCT: 32.2 % — ABNORMAL LOW (ref 35.0–47.0)
Hemoglobin: 10.6 g/dL — ABNORMAL LOW (ref 12.0–16.0)

## 2017-08-27 LAB — CBC
HEMATOCRIT: 31.4 % — AB (ref 35.0–47.0)
HEMOGLOBIN: 10.8 g/dL — AB (ref 12.0–16.0)
MCH: 31.2 pg (ref 26.0–34.0)
MCHC: 34.2 g/dL (ref 32.0–36.0)
MCV: 91.1 fL (ref 80.0–100.0)
Platelets: 210 10*3/uL (ref 150–440)
RBC: 3.45 MIL/uL — ABNORMAL LOW (ref 3.80–5.20)
RDW: 14.9 % — AB (ref 11.5–14.5)
WBC: 9.8 10*3/uL (ref 3.6–11.0)

## 2017-08-27 LAB — GLUCOSE, CAPILLARY
GLUCOSE-CAPILLARY: 172 mg/dL — AB (ref 65–99)
GLUCOSE-CAPILLARY: 203 mg/dL — AB (ref 65–99)
Glucose-Capillary: 226 mg/dL — ABNORMAL HIGH (ref 65–99)

## 2017-08-27 LAB — COMPREHENSIVE METABOLIC PANEL
ALBUMIN: 3.2 g/dL — AB (ref 3.5–5.0)
ALT: 12 U/L — ABNORMAL LOW (ref 14–54)
ANION GAP: 8 (ref 5–15)
AST: 11 U/L — AB (ref 15–41)
Alkaline Phosphatase: 42 U/L (ref 38–126)
BUN: 26 mg/dL — ABNORMAL HIGH (ref 6–20)
CHLORIDE: 101 mmol/L (ref 101–111)
CO2: 28 mmol/L (ref 22–32)
Calcium: 8.6 mg/dL — ABNORMAL LOW (ref 8.9–10.3)
Creatinine, Ser: 0.81 mg/dL (ref 0.44–1.00)
GFR calc Af Amer: >60 mL/min (ref >60)
GFR calc non Af Amer: >60 mL/min (ref >60)
GLUCOSE: 182 mg/dL — AB (ref 65–99)
POTASSIUM: 4.3 mmol/L (ref 3.5–5.1)
Sodium: 137 mmol/L (ref 135–145)
Total Bilirubin: 0.6 mg/dL (ref 0.3–1.2)
Total Protein: 5.7 g/dL — ABNORMAL LOW (ref 6.5–8.1)

## 2017-08-27 MED ORDER — IOPAMIDOL (ISOVUE-370) INJECTION 76%
100.0000 mL | Freq: Once | INTRAVENOUS | Status: AC | PRN
  Administered 2017-08-27: 100 mL via INTRAVENOUS

## 2017-08-27 MED ORDER — PEG 3350-KCL-NA BICARB-NACL 420 G PO SOLR
4000.0000 mL | Freq: Once | ORAL | Status: AC
  Administered 2017-08-27: 4000 mL via ORAL
  Filled 2017-08-27: qty 4000

## 2017-08-27 MED ORDER — INSULIN ASPART 100 UNIT/ML ~~LOC~~ SOLN
0.0000 [IU] | Freq: Four times a day (QID) | SUBCUTANEOUS | Status: DC
  Administered 2017-08-27 (×2): 5 [IU] via SUBCUTANEOUS
  Administered 2017-08-28 (×2): 3 [IU] via SUBCUTANEOUS
  Administered 2017-08-28: 5 [IU] via SUBCUTANEOUS
  Administered 2017-08-29: 8 [IU] via SUBCUTANEOUS
  Administered 2017-08-29: 5 [IU] via SUBCUTANEOUS
  Administered 2017-08-29: 8 [IU] via SUBCUTANEOUS
  Filled 2017-08-27 (×8): qty 1

## 2017-08-27 NOTE — Progress Notes (Signed)
[facility] [place] NAME: Karen [name]    MR#:  [id number]  DATE OF BIRTH:  June 22, 1945  SUBJECTIVE:  CHIEF COMPLAINT:   Chief Complaint  Patient presents with  . Rectal Bleeding  Patient without complaint, no events overnight per nursing staff, CT angiogram noted for severe diverticulosis/no active bleeding, pelvic ultrasound was unimpressive, discuss plan of care with gastroenterology-for possible endoscopy later today  REVIEW OF SYSTEMS:  CONSTITUTIONAL: No fever, fatigue or weakness.  EYES: No blurred or double vision.  EARS, NOSE, AND THROAT: No tinnitus or ear pain.  RESPIRATORY: No cough, shortness of breath, wheezing or hemoptysis.  CARDIOVASCULAR: No chest pain, orthopnea, edema.  GASTROINTESTINAL: No nausea, vomiting, diarrhea or abdominal pain.  GENITOURINARY: No dysuria, hematuria.  ENDOCRINE: No polyuria, nocturia,  HEMATOLOGY: No anemia, easy bruising or bleeding SKIN: No rash or lesion. MUSCULOSKELETAL: No joint pain or arthritis.   NEUROLOGIC: No tingling, numbness, weakness.  PSYCHIATRY: No anxiety or depression.   ROS  DRUG ALLERGIES:   Allergies  Allergen Reactions  . Ace Inhibitors   . Gabapentin Hives  . Lisinopril   . Lyrica [Pregabalin]     VITALS:  Blood pressure 135/62, pulse 79, temperature (!) 97.5 F (36.4 C), temperature source Oral, resp. rate 20, height 5\' 3"  (1.6 m), weight 103.4 kg (228 lb), SpO2 99 %.  PHYSICAL EXAMINATION:  GENERAL:  72 y.o.-year-old patient lying in the bed with no acute distress.  EYES: Pupils equal, round, reactive to light and accommodation. No scleral icterus. Extraocular muscles intact.  HEENT: Head atraumatic, normocephalic. Oropharynx and nasopharynx clear.  NECK:  Supple, no jugular venous distention. No thyroid enlargement, no tenderness.  LUNGS: Normal breath sounds bilaterally, no wheezing, rales,rhonchi or crepitation. No use of accessory muscles of  respiration.  CARDIOVASCULAR: S1, S2 normal. No murmurs, rubs, or gallops.  ABDOMEN: Soft, nontender, nondistended. Bowel sounds present. No organomegaly or mass.  EXTREMITIES: No pedal edema, cyanosis, or clubbing.  NEUROLOGIC: Cranial nerves II through XII are intact. Muscle strength 5/5 in all extremities. Sensation intact. Gait not checked.  PSYCHIATRIC: The patient is alert and oriented x 3.  SKIN: No obvious rash, lesion, or ulcer.   Physical Exam LABORATORY PANEL:   CBC Recent Labs  Lab 08/27/17 0228 08/27/17 0828  WBC 9.8  --   HGB 10.8* 10.6*  HCT 31.4* 32.2*  PLT 210  --    ------------------------------------------------------------------------------------------------------------------  Chemistries  Recent Labs  Lab 08/27/17 0228  NA 137  K 4.3  CL 101  CO2 28  GLUCOSE 182*  BUN 26*  CREATININE 0.81  CALCIUM 8.6*  AST 11*  ALT 12*  ALKPHOS 42  BILITOT 0.6   ------------------------------------------------------------------------------------------------------------------  Cardiac Enzymes No results for input(s): TROPONINI in the last 168 hours. ------------------------------------------------------------------------------------------------------------------  RADIOLOGY:  Ct Angio Abd/pel W/ And/or W/o  Result Date: 08/27/2017 CLINICAL DATA:  rectal bleeding. The symptoms started this afternoon. Patient states she had 3 episodes at home. Was bright red. She states it would fill the toilet. This was accompanied by some lower abdominal pain and discomfort. While here in the emergency department she had another bloody bowel movement. She denies similar symptoms in the past. Denies history of hemorrhoids or straining. Denies having undergone colonoscopy in the past. Denies any history of intestinal disorder for herself or in her family. EXAM: CTA ABDOMEN AND PELVIS wITHOUT AND WITH CONTRAST TECHNIQUE: Multidetector CT imaging of the abdomen and pelvis was  performed using the standard protocol  before and during bolus administration of intravenous contrast. Multiplanar reconstructed images and MIPs were obtained and reviewed to evaluate the vascular anatomy. CONTRAST:  113mL ISOVUE-370 IOPAMIDOL (ISOVUE-370) INJECTION 76% COMPARISON:  04/02/2014 FINDINGS: VASCULAR Aorta: Scattered atheromatous calcified plaque in the infrarenal segment. No aneurysm, dissection, or stenosis. Celiac: Patent without evidence of aneurysm, dissection, vasculitis or significant stenosis. SMA: Patent without evidence of aneurysm, dissection, vasculitis or significant stenosis. Minimal eccentric nonocclusive calcified plaque. Renals: Both renal arteries are patent without evidence of aneurysm, dissection, vasculitis, fibromuscular dysplasia or significant stenosis. IMA: Patent without evidence of aneurysm, dissection, vasculitis or significant stenosis. Inflow: Patent without evidence of aneurysm, dissection, vasculitis or significant stenosis. Mild scattered calcified plaque. Proximal Outflow: Bilateral common femoral and visualized portions of the superficial and profunda femoral arteries are patent without evidence of aneurysm, dissection, vasculitis or significant stenosis. Veins: Patent hepatic veins, portal vein, SMV, splenic vein, bilateral renal veins, IVC, iliac venous system. Bilateral pelvic phleboliths. Review of the MIP images confirms the above findings. NON-VASCULAR Lower chest: No acute abnormality. Hepatobiliary: No focal liver abnormality is seen. Status post cholecystectomy. No biliary dilatation. Pancreas: Mild diffuse atrophy.  No mass or ductal dilatation. Spleen: Normal in size without focal abnormality. Adrenals/Urinary Tract: Normal adrenal glands. 9 mm calculus in the lower pole right renal collecting system. No hydronephrosis. No focal renal lesion. Ureters decompressed. Urinary bladder incompletely distended. Stomach/Bowel: No evidence of extravasation. Stomach and  small bowel are decompressed. Appendix not identified. No pericecal inflammatory/edematous change. Colon is nondilated. Ascending, descending, and sigmoid colon diverticula without significant adjacent inflammatory/edematous change or abscess. Lymphatic: Hyperdense normal-sized bilateral common iliac, right external iliac, and bilateral inguinal lymph nodes, stable. No abdominal adenopathy. Reproductive: Uterus and bilateral adnexa are unremarkable. Other: No ascites.  No free air. Musculoskeletal: Facet DJD in the lower lumbar spine probably accounting for the grade 1 anterolisthesis L4-5, stable since prior study. Negative for fracture or worrisome bone lesion. IMPRESSION: VASCULAR 1. Negative for active extravasation or vascular lesion to suggest an etiology of GI bleeding. 2.  Aortoiliac atherosclerosis (ICD10-170.0) NON-VASCULAR 1. No acute findings. 2. Extensive colonic diverticulosis. 3. Right nonobstructive nephrolithiasis. Electronically Signed   By: Lucrezia Europe M.D.   On: 08/27/2017 11:00    ASSESSMENT AND PLAN:  Karen [name]  is a 72 y.o. female with a known history of insulin requiring diabetes metas, hypertension, hyperlipidemia, chronic CHF, chronic COPD and other medical problems is presenting to the ED with a chief complaint of rectal bleeding.  Patient had 3 episodes of bright red blood per rectum today and patient came into the emergency departmen  *Acute GIB Stable CBC daily, PPI, gastroenterology to see for possible endoscopy for further evaluation, continue to avoid anticoagulants/antiplatelet/NSAID medications   *Chronic diabetes mellitus type 2  Sliding scale insulin with Accu-Cheks per routine   *chronic CHF without exacerbation  Stable Continue current regiment  *COPD no exacerbation Stable Breathing treatments as needed  *Chronic Hyperlipidemia Stable Continue statin rx  Disposition pending clinical course  All the records are reviewed and case discussed with  Care Management/Social Workerr. Management plans discussed with the patient, family and they are in agreement.  CODE STATUS: full  TOTAL TIME TAKING CARE OF THIS PATIENT: 35 minutes.     POSSIBLE D/C IN 1-2 DAYS, DEPENDING ON CLINICAL CONDITION.   [name] Salary M.D on 08/27/2017   Between 7am to 6pm - Pager - [phone]  After 6pm go to [email or web address] - password [email or web address]  [employer]  [phone]  CC: Primary care physician; White, Orlene Och, NP  Note: This dictation was prepared with Dragon dictation along with smaller phrase technology. Any transcriptional errors that result from this process are unintentional.

## 2017-08-27 NOTE — Consult Note (Signed)
Lucilla Lame, MD [facility]  [address], [place], [place] [address] Phone: [phone] Fax : [phone]  Consultation  Referring Provider:     Dr. Jerelyn Charles Primary Care Physician:  Ricardo Jericho, NP Primary Gastroenterologist: Althia Forts         Reason for Consultation:     Hematochezia  Date of Admission:  08/26/2017 Date of Consultation:  08/27/2017         HPI:   Karen [name] is a 72 y.o. female who was admitted with rectal bleeding.  The patient reports that she had rectal bleeding 3 times the day of admission which was yesterday.  The patient denies ever having any rectal bleeding in the past.  She also denies ever having a colonoscopy.  The patient is followed by Dr. Dema Severin at [facility] primary care and does not recall ever being told that she needed a colonoscopy.  The patient does report that her husband has had a colonoscopy.  Since being admitted to the hospital patient has had no further episodes of rectal bleeding nor has she had any bowel movements.  The patient has a normal hemoglobin of around 12-1/2 with a drop in her hemoglobin this morning up to 10.8 and a repeat 6 hours later was 10.6.  Patient had a CT angiography that showed no source of any active bleeding.  There was some aortoiliac atherosclerosis and extensive colonic diverticulosis with nonobstructing right nephrolithiasis.  Past Medical History:  Diagnosis Date  . Acute on chronic respiratory failure with hypoxia and hypercapnia ([place]) 01/07/2015  . Anemia   . 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 Cataract   . CHF (congestive heart failure) ([place])   . Chronic kidney disease 08/10/2017  . CKD (chronic kidney disease)   . COPD (chronic obstructive pulmonary disease) ([place])   . Diabetes mellitus without complication ([place])   . Edema, peripheral 04/20/2014  . Hypertension   . Iron deficiency anemia 06/22/2014  . Leucocytosis 10/19/2015  . Primary osteoarthritis of right knee 09/01/2016  . Renal insufficiency   .  Sciatica 01/07/2015    Past Surgical History:  Procedure Laterality Date  . CESAREAN SECTION    . CHOLECYSTECTOMY    . EYE SURGERY      Prior to Admission medications   Medication Sig Start Date End Date Taking? Authorizing Provider  aspirin EC 81 MG tablet Take 1 tablet by mouth daily.   Yes [provider]  atorvastatin (LIPITOR) 10 MG tablet Take 1 tablet by mouth daily.   Yes [provider]  canagliflozin (INVOKANA) 100 MG TABS tablet Take 1 tablet by mouth daily.   Yes [provider]  fluticasone furoate-vilanterol (BREO ELLIPTA) 100-25 MCG/INH AEPB Inhale 1 puff into the lungs daily.   Yes [provider]  HUMALOG 100 UNIT/ML injection Inject 76 Units into the skin daily. In VGO device 08/19/17  Yes [provider]  hydrochlorothiazide (HYDRODIURIL) 25 MG tablet Take 25 mg by mouth daily. 08/10/17  Yes [provider]  Insulin Disposable Pump (V-GO 40) KIT Inject into the skin as directed. 08/14/17  Yes [provider]  metFORMIN (GLUCOPHAGE) 500 MG tablet Take 1,000 mg by mouth 2 (two) times daily with a meal.    Yes [provider]  montelukast (SINGULAIR) 10 MG tablet Take 10 mg by mouth at bedtime.   Yes [provider]  OXYGEN Place 3 L/min into the nose.     [provider]    Family History  Family  history unknown: Yes     Social History   Tobacco Use  . Smoking status: Former Research scientist (life sciences)  . Smokeless tobacco: Never Used  Substance Use Topics  . Alcohol use: No  . Drug use: No    Allergies as of 08/26/2017 - Review Complete 08/26/2017  Allergen Reaction Noted  . Ace inhibitors  01/07/2015  . Gabapentin Hives 01/07/2015  . Lisinopril  01/07/2015  . Lyrica [pregabalin]  08/17/2015    Review of Systems:    All systems reviewed and negative except where noted in HPI.   Physical Exam:  Vital signs in last 24 hours: Temp:  [97.5 F (36.4 C)-98 F (36.7 C)] 97.5 F (36.4 C)  (06/24 1212) Pulse Rate:  [79-92] 79 (06/24 1212) Resp:  [17-22] 20 (06/24 1212) BP: (121-156)/(58-69) 135/62 (06/24 1212) SpO2:  [94 %-99 %] 99 % (06/24 1212) Last BM Date: 08/26/17 General:   Pleasant, cooperative in NAD Head:  Normocephalic and atraumatic. Eyes:   No icterus.   Conjunctiva pink. PERRLA. Ears:  Normal auditory acuity. Neck:  Supple; no masses or thyroidomegaly Lungs: Respirations even and unlabored. Lungs clear to auscultation bilaterally.   No wheezes, crackles, or rhonchi.  Heart:  Regular rate and rhythm;  Without murmur, clicks, rubs or gallops Abdomen:  Soft, nondistended, nontender. Normal bowel sounds. No appreciable masses or hepatomegaly.  No rebound or guarding.  Rectal:  Not performed. Msk:  Symmetrical without gross deformities.    Extremities:  Without edema, cyanosis or clubbing. Neurologic:  Alert and oriented x3;  grossly normal neurologically. Skin:  Intact without significant lesions or rashes. Cervical Nodes:  No significant cervical adenopathy. Psych:  Alert and cooperative. Normal affect.  LAB RESULTS: Recent Labs    08/26/17 1639 08/26/17 2049 08/27/17 0228 08/27/17 0828  WBC 13.7*  --  9.8  --   HGB 12.3 12.0 10.8* 10.6*  HCT 36.6 35.9 31.4* 32.2*  PLT 272  --  210  --    BMET Recent Labs    08/26/17 1639 08/27/17 0228  NA 135 137  K 4.5 4.3  CL 98* 101  CO2 27 28  GLUCOSE 175* 182*  BUN 24* 26*  CREATININE 0.98 0.81  CALCIUM 9.1 8.6*   LFT Recent Labs    08/27/17 0228  PROT 5.7*  ALBUMIN 3.2*  AST 11*  ALT 12*  ALKPHOS 42  BILITOT 0.6   PT/INR No results for input(s): LABPROT, INR in the last 72 hours.  STUDIES: Ct Angio Abd/pel W/ And/or W/o  Result Date: 08/27/2017 CLINICAL DATA:  rectal bleeding. The symptoms started this afternoon. Patient states she had 3 episodes at home. Was bright red. She states it would fill the toilet. This was accompanied by some lower abdominal pain and discomfort. While here in  the emergency department she had another bloody bowel movement. She denies similar symptoms in the past. Denies history of hemorrhoids or straining. Denies having undergone colonoscopy in the past. Denies any history of intestinal disorder for herself or in her family. EXAM: CTA ABDOMEN AND PELVIS wITHOUT AND WITH CONTRAST TECHNIQUE: Multidetector CT imaging of the abdomen and pelvis was performed using the standard protocol before and during bolus administration of intravenous contrast. Multiplanar reconstructed images and MIPs were obtained and reviewed to evaluate the vascular anatomy. CONTRAST:  176m ISOVUE-370 IOPAMIDOL (ISOVUE-370) INJECTION 76% COMPARISON:  04/02/2014 FINDINGS: VASCULAR Aorta: Scattered atheromatous calcified plaque in the infrarenal segment. No aneurysm, dissection, or stenosis. Celiac: Patent without evidence of aneurysm, dissection, vasculitis or  significant stenosis. SMA: Patent without evidence of aneurysm, dissection, vasculitis or significant stenosis. Minimal eccentric nonocclusive calcified plaque. Renals: Both renal arteries are patent without evidence of aneurysm, dissection, vasculitis, fibromuscular dysplasia or significant stenosis. IMA: Patent without evidence of aneurysm, dissection, vasculitis or significant stenosis. Inflow: Patent without evidence of aneurysm, dissection, vasculitis or significant stenosis. Mild scattered calcified plaque. Proximal Outflow: Bilateral common femoral and visualized portions of the superficial and profunda femoral arteries are patent without evidence of aneurysm, dissection, vasculitis or significant stenosis. Veins: Patent hepatic veins, portal vein, SMV, splenic vein, bilateral renal veins, IVC, iliac venous system. Bilateral pelvic phleboliths. Review of the MIP images confirms the above findings. NON-VASCULAR Lower chest: No acute abnormality. Hepatobiliary: No focal liver abnormality is seen. Status post cholecystectomy. No biliary  dilatation. Pancreas: Mild diffuse atrophy.  No mass or ductal dilatation. Spleen: Normal in size without focal abnormality. Adrenals/Urinary Tract: Normal adrenal glands. 9 mm calculus in the lower pole right renal collecting system. No hydronephrosis. No focal renal lesion. Ureters decompressed. Urinary bladder incompletely distended. Stomach/Bowel: No evidence of extravasation. Stomach and small bowel are decompressed. Appendix not identified. No pericecal inflammatory/edematous change. Colon is nondilated. Ascending, descending, and sigmoid colon diverticula without significant adjacent inflammatory/edematous change or abscess. Lymphatic: Hyperdense normal-sized bilateral common iliac, right external iliac, and bilateral inguinal lymph nodes, stable. No abdominal adenopathy. Reproductive: Uterus and bilateral adnexa are unremarkable. Other: No ascites.  No free air. Musculoskeletal: Facet DJD in the lower lumbar spine probably accounting for the grade 1 anterolisthesis L4-5, stable since prior study. Negative for fracture or worrisome bone lesion. IMPRESSION: VASCULAR 1. Negative for active extravasation or vascular lesion to suggest an etiology of GI bleeding. 2.  Aortoiliac atherosclerosis (ICD10-170.0) NON-VASCULAR 1. No acute findings. 2. Extensive colonic diverticulosis. 3. Right nonobstructive nephrolithiasis. Electronically Signed   By: Lucrezia Europe M.D.   On: 08/27/2017 11:00      Impression / Plan:   Assessment: Active Problems:   GI bleed   Karen [name] is a 72 y.o. y/o female with a history of rectal bleeding x3 yesterday.  The patient has had no further GI bleeding or bowel movements.  The patient has never had a colonoscopy in the past.  The patient did have some lower abdominal pain with her rectal bleeding.  Plan:  The patient will be set up for colonoscopy for tomorrow.  The patient will be given a prep today for the procedure.  The differential diagnosis includes a neoplasm  versus diverticular disease versus ischemic colitis versus hemorrhoidal bleeding.  The patient has been explained the plan and agrees with it.  Thank you for involving me in the care of this patient.      LOS: 1 day   Lucilla Lame, MD  08/27/2017, 5:02 PM    Note: This dictation was prepared with Dragon dictation along with smaller phrase technology. Any transcriptional errors that result from this process are unintentional.

## 2017-08-27 NOTE — Progress Notes (Addendum)
Inpatient Diabetes Program Recommendations  AACE/ADA: New Consensus Statement on Inpatient Glycemic Control (2015)  Target Ranges:  Prepandial:   less than 140 mg/dL      Peak postprandial:   less than 180 mg/dL (1-2 hours)      Critically ill patients:  140 - 180 mg/dL   Results for Karen [name], Karen [name] (MRN [id number]) as of 08/27/2017 13:33  Ref. Range 08/26/2017 21:33 08/27/2017 08:50  Glucose-Capillary Latest Ref Range: 65 - 99 mg/dL 194 (H) 172 (H)     Admit GIB  History: DM, CHF, COPD, CKD  Home DM Meds: Metformin 1000 mg BID        Invokana 100 mg daily        VGO 40 Disposable Insulin Pump  Current Orders: Novolog Moderate Correction Scale/ SSI (0-15 units) Q6 hours    Met with patient around 2:30pm today.  Pt told me she was asked to remove her VGO disposable insulin pump so she could go to CT scan.  Pt stated the MD she saw earlier told her she could resume her VGO insulin pump after all testing completed.  Note patient waiting on GI consult and may have Endoscopy at some point.  Pt stated she has new VGO disposable pump at bedside that is filled and ready to be placed.  Instructed patient to wait to resume her VGO Insulin pump until the MD gives [place] permission to have pt resume her pump.  Note Novolog SSi to start at 6pm tonight.    MD- CBGs stable so far.  If CBGs become elevated, may want to have patient go ahead and resume her VGO insulin pump later tonight.       Endocrinologist: Dr. Marcello Moores O'Connell/ Malissa Hippo, NP for [facility]-- Last seen 07/20/2017.  Was given the following instructions at that visit: 1. Continue Metformin 1000 mg twice 2. Continue Invokana 100 mg daily 3. Continue Vi-Go 40 plus 6,2,2 clicks for small, medium, large meal 4. Continue to check blood sugars as you are now. We will retry to get Free [place] approved      --Will follow patient during hospitalization--  Wyn Quaker RN, MSN, CDE Diabetes  Coordinator Inpatient Glycemic Control Team Team Pager: [phone] (8a-5p)

## 2017-08-28 ENCOUNTER — Encounter
Admission: EM | Disposition: A | Discharge status: Home / Self Care | Payer: Self-pay | Source: Home / Self Care | Attending: Emergency Medicine

## 2017-08-28 ENCOUNTER — Encounter: Payer: Self-pay | Admitting: Anesthesiology

## 2017-08-28 DIAGNOSIS — K922 Gastrointestinal hemorrhage, unspecified: Secondary | ICD-10-CM | POA: Diagnosis not present

## 2017-08-28 DIAGNOSIS — K5731 Diverticulosis of large intestine without perforation or abscess with bleeding: Secondary | ICD-10-CM | POA: Diagnosis not present

## 2017-08-28 HISTORY — PX: COLONOSCOPY WITH PROPOFOL: SHX5780

## 2017-08-28 LAB — CBC
HEMATOCRIT: 29.9 % — AB (ref 35.0–47.0)
HEMOGLOBIN: 10.1 g/dL — AB (ref 12.0–16.0)
MCH: 31.1 pg (ref 26.0–34.0)
MCHC: 33.9 g/dL (ref 32.0–36.0)
MCV: 91.7 fL (ref 80.0–100.0)
Platelets: 202 10*3/uL (ref 150–440)
RBC: 3.27 MIL/uL — ABNORMAL LOW (ref 3.80–5.20)
RDW: 14.8 % — ABNORMAL HIGH (ref 11.5–14.5)
WBC: 8.1 10*3/uL (ref 3.6–11.0)

## 2017-08-28 LAB — GLUCOSE, CAPILLARY
Glucose-Capillary: 176 mg/dL — ABNORMAL HIGH (ref 70–99)
Glucose-Capillary: 177 mg/dL — ABNORMAL HIGH (ref 70–99)
Glucose-Capillary: 241 mg/dL — ABNORMAL HIGH (ref 70–99)

## 2017-08-28 SURGERY — COLONOSCOPY WITH PROPOFOL
Anesthesia: General

## 2017-08-28 MED ORDER — HYDROMORPHONE HCL 1 MG/ML IJ SOLN
0.5000 mg | INTRAMUSCULAR | Status: DC | PRN
  Administered 2017-08-28: 0.5 mg via INTRAVENOUS
  Filled 2017-08-28: qty 0.5

## 2017-08-28 MED ORDER — POLYETHYLENE GLYCOL 3350 17 GM/SCOOP PO POWD
1.0000 | Freq: Once | ORAL | Status: AC
  Administered 2017-08-28: 255 g via ORAL
  Filled 2017-08-28: qty 255

## 2017-08-28 NOTE — Progress Notes (Signed)
Karen Lame, MD [facility]   [address], [place], [place] [address] Phone: [phone] Fax : [phone]   Subjective: The patient was only able to drink half of her prep and then vomited up what she had taken in.  She was having soft stools.  There is no report of any further GI bleeding.   Objective: Vital signs in last 24 hours: Vitals:   08/27/17 1212 08/27/17 2004 08/28/17 0356 08/28/17 1257  BP: 135/62 (!) 156/66 (!) 158/68 (!) 151/63  Pulse: 79 78 91 73  Resp: 20 (!) 22 (!) 22 16  Temp: (!) 97.5 F (36.4 C) 98.2 F (36.8 C) 98.3 F (36.8 C) 98 F (36.7 C)  TempSrc: Oral Oral Oral Oral  SpO2: 99% 98% 96% 100%  Weight:      Height:       Weight change:   Intake/Output Summary (Last 24 hours) at 08/28/2017 1348 Last data filed at 08/28/2017 1345 Gross per 24 hour  Intake 3514 ml  Output 700 ml  Net 2814 ml     Exam: Heart:: Regular rate and rhythm, S1S2 present or without murmur or extra heart sounds Lungs: normal and clear to auscultation and percussion Abdomen: soft, nontender, normal bowel sounds   Lab Results: @LABTEST2 @ Micro Results: No results found for this or any previous visit (from the past 240 hour(s)). Studies/Results: Ct Angio Abd/pel W/ And/or W/o  Result Date: 08/27/2017 CLINICAL DATA:  rectal bleeding. The symptoms started this afternoon. Patient states she had 3 episodes at home. Was bright red. She states it would fill the toilet. This was accompanied by some lower abdominal pain and discomfort. While here in the emergency department she had another bloody bowel movement. She denies similar symptoms in the past. Denies history of hemorrhoids or straining. Denies having undergone colonoscopy in the past. Denies any history of intestinal disorder for herself or in her family. EXAM: CTA ABDOMEN AND PELVIS wITHOUT AND WITH CONTRAST TECHNIQUE: Multidetector CT imaging of the abdomen and pelvis was performed using the standard protocol before  and during bolus administration of intravenous contrast. Multiplanar reconstructed images and MIPs were obtained and reviewed to evaluate the vascular anatomy. CONTRAST:  170mL ISOVUE-370 IOPAMIDOL (ISOVUE-370) INJECTION 76% COMPARISON:  04/02/2014 FINDINGS: VASCULAR Aorta: Scattered atheromatous calcified plaque in the infrarenal segment. No aneurysm, dissection, or stenosis. Celiac: Patent without evidence of aneurysm, dissection, vasculitis or significant stenosis. SMA: Patent without evidence of aneurysm, dissection, vasculitis or significant stenosis. Minimal eccentric nonocclusive calcified plaque. Renals: Both renal arteries are patent without evidence of aneurysm, dissection, vasculitis, fibromuscular dysplasia or significant stenosis. IMA: Patent without evidence of aneurysm, dissection, vasculitis or significant stenosis. Inflow: Patent without evidence of aneurysm, dissection, vasculitis or significant stenosis. Mild scattered calcified plaque. Proximal Outflow: Bilateral common femoral and visualized portions of the superficial and profunda femoral arteries are patent without evidence of aneurysm, dissection, vasculitis or significant stenosis. Veins: Patent hepatic veins, portal vein, SMV, splenic vein, bilateral renal veins, IVC, iliac venous system. Bilateral pelvic phleboliths. Review of the MIP images confirms the above findings. NON-VASCULAR Lower chest: No acute abnormality. Hepatobiliary: No focal liver abnormality is seen. Status post cholecystectomy. No biliary dilatation. Pancreas: Mild diffuse atrophy.  No mass or ductal dilatation. Spleen: Normal in size without focal abnormality. Adrenals/Urinary Tract: Normal adrenal glands. 9 mm calculus in the lower pole right renal collecting system. No hydronephrosis. No focal renal lesion. Ureters decompressed. Urinary bladder incompletely distended. Stomach/Bowel: No evidence of extravasation. Stomach and small bowel are decompressed.  Appendix not  identified. No pericecal inflammatory/edematous change. Colon is nondilated. Ascending, descending, and sigmoid colon diverticula without significant adjacent inflammatory/edematous change or abscess. Lymphatic: Hyperdense normal-sized bilateral common iliac, right external iliac, and bilateral inguinal lymph nodes, stable. No abdominal adenopathy. Reproductive: Uterus and bilateral adnexa are unremarkable. Other: No ascites.  No free air. Musculoskeletal: Facet DJD in the lower lumbar spine probably accounting for the grade 1 anterolisthesis L4-5, stable since prior study. Negative for fracture or worrisome bone lesion. IMPRESSION: VASCULAR 1. Negative for active extravasation or vascular lesion to suggest an etiology of GI bleeding. 2.  Aortoiliac atherosclerosis (ICD10-170.0) NON-VASCULAR 1. No acute findings. 2. Extensive colonic diverticulosis. 3. Right nonobstructive nephrolithiasis. Electronically Signed   By: Lucrezia Europe M.D.   On: 08/27/2017 11:00   Medications: I have reviewed the patient's current medications. Scheduled Meds: . fluticasone furoate-vilanterol  1 puff Inhalation Daily  . insulin aspart  0-15 Units Subcutaneous Q6H  . mouth rinse  15 mL Mouth Rinse BID  . [START ON 08/30/2017] pantoprazole  40 mg Intravenous Q12H  . polyethylene glycol powder  1 Container Oral Once   Continuous Infusions: . pantoprozole (PROTONIX) infusion 8 mg/hr (08/28/17 1146)   PRN Meds:.morphine injection, ondansetron **OR** ondansetron (ZOFRAN) IV, traMADol   Assessment: Active Problems:   GI bleed   Acute GI bleeding    Plan: This patient was set up for a colonoscopy for today but was unable to tolerate the prep last night.  The patient will be started on MiraLAX mixed with Gatorade today and set up for a colonoscopy for tomorrow.  The patient has been explained the plan and agrees with it.   LOS: 2 days   Karen [name] 08/28/2017, 1:48 PM

## 2017-08-28 NOTE — Progress Notes (Signed)
[place] [facility] [place] NAME: Karen [name]    MR#:  [id number]  DATE OF BIRTH:  05-14-1945  SUBJECTIVE:  CHIEF COMPLAINT:   Chief Complaint  Patient presents with  . Rectal Bleeding   Patient without complaint, for colonoscopy later today REVIEW OF SYSTEMS:  CONSTITUTIONAL: No fever, fatigue or weakness.  EYES: No blurred or double vision.  EARS, NOSE, AND THROAT: No tinnitus or ear pain.  RESPIRATORY: No cough, shortness of breath, wheezing or hemoptysis.  CARDIOVASCULAR: No chest pain, orthopnea, edema.  GASTROINTESTINAL: No nausea, vomiting, diarrhea or abdominal pain.  GENITOURINARY: No dysuria, hematuria.  ENDOCRINE: No polyuria, nocturia,  HEMATOLOGY: No anemia, easy bruising or bleeding SKIN: No rash or lesion. MUSCULOSKELETAL: No joint pain or arthritis.   NEUROLOGIC: No tingling, numbness, weakness.  PSYCHIATRY: No anxiety or depression.   ROS  DRUG ALLERGIES:   Allergies  Allergen Reactions  . Ace Inhibitors   . Gabapentin Hives  . Lisinopril   . Lyrica [Pregabalin]     VITALS:  Blood pressure (!) 151/63, pulse 73, temperature 98 F (36.7 C), temperature source Oral, resp. rate 16, height 5\' 3"  (1.6 m), weight 103.4 kg (228 lb), SpO2 100 %.  PHYSICAL EXAMINATION:  GENERAL:  72 y.o.-year-old patient lying in the bed with no acute distress.  EYES: Pupils equal, round, reactive to light and accommodation. No scleral icterus. Extraocular muscles intact.  HEENT: Head atraumatic, normocephalic. Oropharynx and nasopharynx clear.  NECK:  Supple, no jugular venous distention. No thyroid enlargement, no tenderness.  LUNGS: Normal breath sounds bilaterally, no wheezing, rales,rhonchi or crepitation. No use of accessory muscles of respiration.  CARDIOVASCULAR: S1, S2 normal. No murmurs, rubs, or gallops.  ABDOMEN: Soft, nontender, nondistended. Bowel sounds present. No organomegaly or mass.  EXTREMITIES: No pedal edema,  cyanosis, or clubbing.  NEUROLOGIC: Cranial nerves II through XII are intact. Muscle strength 5/5 in all extremities. Sensation intact. Gait not checked.  PSYCHIATRIC: The patient is alert and oriented x 3.  SKIN: No obvious rash, lesion, or ulcer.   Physical Exam LABORATORY PANEL:   CBC Recent Labs  Lab 08/28/17 0433  WBC 8.1  HGB 10.1*  HCT 29.9*  PLT 202   ------------------------------------------------------------------------------------------------------------------  Chemistries  Recent Labs  Lab 08/27/17 0228  NA 137  K 4.3  CL 101  CO2 28  GLUCOSE 182*  BUN 26*  CREATININE 0.81  CALCIUM 8.6*  AST 11*  ALT 12*  ALKPHOS 42  BILITOT 0.6   ------------------------------------------------------------------------------------------------------------------  Cardiac Enzymes No results for input(s): TROPONINI in the last 168 hours. ------------------------------------------------------------------------------------------------------------------  RADIOLOGY:  Ct Angio Abd/pel W/ And/or W/o  Result Date: 08/27/2017 CLINICAL DATA:  rectal bleeding. The symptoms started this afternoon. Patient states she had 3 episodes at home. Was bright red. She states it would fill the toilet. This was accompanied by some lower abdominal pain and discomfort. While here in the emergency department she had another bloody bowel movement. She denies similar symptoms in the past. Denies history of hemorrhoids or straining. Denies having undergone colonoscopy in the past. Denies any history of intestinal disorder for herself or in her family. EXAM: CTA ABDOMEN AND PELVIS wITHOUT AND WITH CONTRAST TECHNIQUE: Multidetector CT imaging of the abdomen and pelvis was performed using the standard protocol before and during bolus administration of intravenous contrast. Multiplanar reconstructed images and MIPs were obtained and reviewed to evaluate the vascular anatomy. CONTRAST:  187mL ISOVUE-370  IOPAMIDOL (ISOVUE-370) INJECTION 76% COMPARISON:  04/02/2014 FINDINGS:  VASCULAR Aorta: Scattered atheromatous calcified plaque in the infrarenal segment. No aneurysm, dissection, or stenosis. Celiac: Patent without evidence of aneurysm, dissection, vasculitis or significant stenosis. SMA: Patent without evidence of aneurysm, dissection, vasculitis or significant stenosis. Minimal eccentric nonocclusive calcified plaque. Renals: Both renal arteries are patent without evidence of aneurysm, dissection, vasculitis, fibromuscular dysplasia or significant stenosis. IMA: Patent without evidence of aneurysm, dissection, vasculitis or significant stenosis. Inflow: Patent without evidence of aneurysm, dissection, vasculitis or significant stenosis. Mild scattered calcified plaque. Proximal Outflow: Bilateral common femoral and visualized portions of the superficial and profunda femoral arteries are patent without evidence of aneurysm, dissection, vasculitis or significant stenosis. Veins: Patent hepatic veins, portal vein, SMV, splenic vein, bilateral renal veins, IVC, iliac venous system. Bilateral pelvic phleboliths. Review of the MIP images confirms the above findings. NON-VASCULAR Lower chest: No acute abnormality. Hepatobiliary: No focal liver abnormality is seen. Status post cholecystectomy. No biliary dilatation. Pancreas: Mild diffuse atrophy.  No mass or ductal dilatation. Spleen: Normal in size without focal abnormality. Adrenals/Urinary Tract: Normal adrenal glands. 9 mm calculus in the lower pole right renal collecting system. No hydronephrosis. No focal renal lesion. Ureters decompressed. Urinary bladder incompletely distended. Stomach/Bowel: No evidence of extravasation. Stomach and small bowel are decompressed. Appendix not identified. No pericecal inflammatory/edematous change. Colon is nondilated. Ascending, descending, and sigmoid colon diverticula without significant adjacent inflammatory/edematous change  or abscess. Lymphatic: Hyperdense normal-sized bilateral common iliac, right external iliac, and bilateral inguinal lymph nodes, stable. No abdominal adenopathy. Reproductive: Uterus and bilateral adnexa are unremarkable. Other: No ascites.  No free air. Musculoskeletal: Facet DJD in the lower lumbar spine probably accounting for the grade 1 anterolisthesis L4-5, stable since prior study. Negative for fracture or worrisome bone lesion. IMPRESSION: VASCULAR 1. Negative for active extravasation or vascular lesion to suggest an etiology of GI bleeding. 2.  Aortoiliac atherosclerosis (ICD10-170.0) NON-VASCULAR 1. No acute findings. 2. Extensive colonic diverticulosis. 3. Right nonobstructive nephrolithiasis. Electronically Signed   By: Lucrezia Europe M.D.   On: 08/27/2017 11:00    ASSESSMENT AND PLAN:  [name] a71 y.o.femalewith a known history of insulin requiring diabetes metas, hypertension, hyperlipidemia, chronic CHF, chronic COPD and other medical problems is presenting to the ED with a chief complaint of rectal bleeding. Patient had 3 episodes of bright red blood per rectum today and patient came into the emergency departmen  *Acute GIB Stable CBC daily, PPI, gastroenterology planning for colonoscopy later today, continue to avoid anticoagulants/antiplatelet/NSAID medications   *Chronic diabetes mellitus type 2  Sliding scale insulin with Accu-Cheks per routine   *chronic CHF without exacerbation  Stable Continue current regiment  *COPD no exacerbation Stable Breathing treatments as needed  *Chronic Hyperlipidemia Stable Continue statin rx  Disposition pending clinical course  All the records are reviewed and case discussed with Care Management/Social Workerr. Management plans discussed with the patient, family and they are in agreement.  CODE STATUS: full  TOTAL TIME TAKING CARE OF THIS PATIENT: 35 minutes.     POSSIBLE D/C IN 1-2 DAYS, DEPENDING ON CLINICAL  CONDITION.   [name] Gershom Brobeck M.D on 08/28/2017   Between 7am to 6pm - Pager - [phone]  After 6pm go to [email or web address] - password [email or web address] [facility]  Office  [phone]  CC: Primary care physician; White, Orlene Och, NP  Note: This dictation was prepared with Dragon dictation along with smaller phrase technology. Any transcriptional errors that result from this process are unintentional.

## 2017-08-29 ENCOUNTER — Inpatient Hospital Stay: Payer: Medicare Other | Admitting: Anesthesiology

## 2017-08-29 ENCOUNTER — Encounter: Payer: Self-pay | Admitting: *Deleted

## 2017-08-29 ENCOUNTER — Encounter
Admission: EM | Disposition: A | Discharge status: Home / Self Care | Payer: Self-pay | Source: Home / Self Care | Attending: Emergency Medicine

## 2017-08-29 DIAGNOSIS — K5731 Diverticulosis of large intestine without perforation or abscess with bleeding: Secondary | ICD-10-CM | POA: Diagnosis not present

## 2017-08-29 DIAGNOSIS — K922 Gastrointestinal hemorrhage, unspecified: Secondary | ICD-10-CM | POA: Diagnosis not present

## 2017-08-29 HISTORY — PX: COLONOSCOPY WITH PROPOFOL: SHX5780

## 2017-08-29 LAB — BASIC METABOLIC PANEL
Anion gap: 5 (ref 5–15)
BUN: 8 mg/dL (ref 8–23)
CALCIUM: 8.2 mg/dL — AB (ref 8.9–10.3)
CO2: 29 mmol/L (ref 22–32)
CREATININE: 0.58 mg/dL (ref 0.44–1.00)
Chloride: 102 mmol/L (ref 98–111)
GFR calc Af Amer: >60 mL/min (ref >60)
Glucose, Bld: 279 mg/dL — ABNORMAL HIGH (ref 70–99)
Potassium: 3.6 mmol/L (ref 3.5–5.1)
SODIUM: 136 mmol/L (ref 135–145)

## 2017-08-29 LAB — HEMOGLOBIN AND HEMATOCRIT, BLOOD
HCT: 22.6 % — ABNORMAL LOW (ref 35.0–47.0)
HCT: 20.7 % — ABNORMAL LOW (ref 35.0–47.0)
Hemoglobin: 7.5 g/dL — ABNORMAL LOW (ref 12.0–16.0)
Hemoglobin: 7 g/dL — ABNORMAL LOW (ref 12.0–16.0)

## 2017-08-29 LAB — CBC
HCT: 26.2 % — ABNORMAL LOW (ref 35.0–47.0)
Hemoglobin: 8.7 g/dL — ABNORMAL LOW (ref 12.0–16.0)
MCH: 30.4 pg (ref 26.0–34.0)
MCHC: 33.1 g/dL (ref 32.0–36.0)
MCV: 92 fL (ref 80.0–100.0)
PLATELETS: 231 10*3/uL (ref 150–440)
RBC: 2.84 MIL/uL — AB (ref 3.80–5.20)
RDW: 15.1 % — ABNORMAL HIGH (ref 11.5–14.5)
WBC: 14.6 10*3/uL — ABNORMAL HIGH (ref 3.6–11.0)

## 2017-08-29 LAB — GLUCOSE, CAPILLARY
GLUCOSE-CAPILLARY: 268 mg/dL — AB (ref 70–99)
GLUCOSE-CAPILLARY: 278 mg/dL — AB (ref 70–99)
Glucose-Capillary: 249 mg/dL — ABNORMAL HIGH (ref 70–99)
Glucose-Capillary: 241 mg/dL — ABNORMAL HIGH (ref 70–99)
Glucose-Capillary: 352 mg/dL — ABNORMAL HIGH (ref 70–99)

## 2017-08-29 SURGERY — COLONOSCOPY WITH PROPOFOL
Anesthesia: General

## 2017-08-29 MED ORDER — PROPOFOL 500 MG/50ML IV EMUL
INTRAVENOUS | Status: AC
  Filled 2017-08-29: qty 50

## 2017-08-29 MED ORDER — INSULIN ASPART 100 UNIT/ML ~~LOC~~ SOLN
0.0000 [IU] | Freq: Three times a day (TID) | SUBCUTANEOUS | Status: DC
  Administered 2017-08-29: 15 [IU] via SUBCUTANEOUS
  Administered 2017-08-30: 8 [IU] via SUBCUTANEOUS
  Filled 2017-08-29 (×2): qty 1

## 2017-08-29 MED ORDER — PROPOFOL 10 MG/ML IV BOLUS
INTRAVENOUS | Status: DC | PRN
  Administered 2017-08-29: 60 mg via INTRAVENOUS

## 2017-08-29 MED ORDER — LIDOCAINE HCL (PF) 2 % IJ SOLN
INTRAMUSCULAR | Status: AC
  Filled 2017-08-29: qty 10

## 2017-08-29 MED ORDER — SODIUM CHLORIDE 0.9 % IV SOLN
INTRAVENOUS | Status: DC
  Administered 2017-08-29: 12:00:00 via INTRAVENOUS

## 2017-08-29 MED ORDER — LIDOCAINE HCL (CARDIAC) PF 100 MG/5ML IV SOSY
PREFILLED_SYRINGE | INTRAVENOUS | Status: DC | PRN
  Administered 2017-08-29: 60 mg via INTRAVENOUS

## 2017-08-29 MED ORDER — PROPOFOL 500 MG/50ML IV EMUL
INTRAVENOUS | Status: DC | PRN
  Administered 2017-08-29: 120 ug/kg/min via INTRAVENOUS

## 2017-08-29 MED ORDER — PHENYLEPHRINE HCL 10 MG/ML IJ SOLN
INTRAMUSCULAR | Status: DC | PRN
  Administered 2017-08-29: 100 ug via INTRAVENOUS
  Administered 2017-08-29: 150 ug via INTRAVENOUS
  Administered 2017-08-29 (×2): 100 ug via INTRAVENOUS

## 2017-08-29 NOTE — Care Management CC44 (Signed)
Condition Code 44 Documentation Completed  Patient Details  Name: Karen [name] MRN: [id number] Date of Birth: [birth year]   Condition Code 44 given:  Yes Patient signature on Condition Code 44 notice:  Yes Documentation of 2 MD's agreement:  Yes Code 44 added to claim:  Yes    Beverly Sessions, RN 08/29/2017, 4:26 PM

## 2017-08-29 NOTE — Anesthesia Preprocedure Evaluation (Signed)
Anesthesia Evaluation  Patient identified by MRN, date of birth, ID band Patient awake    Reviewed: Allergy & Precautions, H&P , NPO status , Patient's Chart, lab work & pertinent test results, reviewed documented beta blocker date and time   History of Anesthesia Complications Negative for: history of anesthetic complications  Airway Mallampati: III  TM Distance: >3 FB Neck ROM: full    Dental  (+) Edentulous Upper, Dental Advidsory Given, Missing, Poor Dentition, Upper Dentures   Pulmonary shortness of breath, with exertion and Long-Term Oxygen Therapy, neg sleep apnea, COPD,  COPD inhaler and oxygen dependent, neg recent URI, former smoker,           Cardiovascular Exercise Tolerance: Good hypertension, (-) angina+CHF  (-) CAD, (-) Past MI, (-) Cardiac Stents and (-) CABG (-) dysrhythmias (-) Valvular Problems/Murmurs     Neuro/Psych negative neurological ROS  negative psych ROS   GI/Hepatic negative GI ROS, Neg liver ROS,   Endo/Other  diabetes, Insulin Dependent, Oral Hypoglycemic AgentsMorbid obesity  Renal/GU CRFRenal disease  negative genitourinary   Musculoskeletal   Abdominal   Peds  Hematology  (+) Blood dyscrasia, anemia ,   Anesthesia Other Findings Past Medical History: 01/07/2015: Acute on chronic respiratory failure with hypoxia and  hypercapnia (HCC) No date: Anemia 01/07/2015: [place] No date: Cataract No date: CHF (congestive heart failure) ([place]) 08/10/2017: Chronic kidney disease No date: CKD (chronic kidney disease) No date: COPD (chronic obstructive pulmonary disease) (HCC) No date: Diabetes mellitus without complication ([place]) 07/21/6158: Edema, peripheral No date: Hypertension 06/22/2014: Iron deficiency anemia 10/19/2015: Leucocytosis 09/01/2016: Primary osteoarthritis of right knee No date: Renal insufficiency 01/07/2015: Sciatica   Reproductive/Obstetrics negative OB ROS                              Anesthesia Physical Anesthesia Plan  ASA: IV  Anesthesia Plan: General   Post-op Pain Management:    Induction: Intravenous  PONV Risk Score and Plan: 3 and Propofol infusion  Airway Management Planned: Nasal Cannula  Additional Equipment:   Intra-op Plan:   Post-operative Plan:   Informed Consent: I have reviewed the patients History and Physical, chart, labs and discussed the procedure including the risks, benefits and alternatives for the proposed anesthesia with the patient or authorized representative who has indicated his/her understanding and acceptance.   Dental Advisory Given  Plan Discussed with: Anesthesiologist, CRNA and Surgeon  Anesthesia Plan Comments:         Anesthesia Quick Evaluation

## 2017-08-29 NOTE — Care Management Important Message (Signed)
Copy of signed IM left with patient in room.  

## 2017-08-29 NOTE — Anesthesia Post-op Follow-up Note (Signed)
Anesthesia QCDR form completed.        

## 2017-08-29 NOTE — Transfer of Care (Signed)
Immediate Anesthesia Transfer of Care Note  Patient: Karen [name]  Procedure(s) Performed: COLONOSCOPY WITH PROPOFOL (N/A )  Patient Location: PACU  Anesthesia Type:General  Level of Consciousness: sedated  Airway & Oxygen Therapy: Patient Spontanous Breathing and Patient connected to nasal cannula oxygen  Post-op Assessment: Report given to RN and Post -op Vital signs reviewed and stable  Post vital signs: Reviewed and stable  Last Vitals:  Vitals Value Taken Time  BP 120/81 08/29/2017  1:49 PM  Temp 36.2 C 08/29/2017  1:49 PM  Pulse 85 08/29/2017  1:49 PM  Resp 18 08/29/2017  1:49 PM  SpO2 99 % 08/29/2017  1:49 PM    Last Pain:  Vitals:   08/29/17 1349  TempSrc: Tympanic  PainSc: 0-No pain         Complications: No apparent anesthesia complications

## 2017-08-29 NOTE — Anesthesia Postprocedure Evaluation (Signed)
Anesthesia Post Note  Patient: Karen [name]  Procedure(s) Performed: COLONOSCOPY WITH PROPOFOL (N/A )  Patient location during evaluation: Endoscopy Anesthesia Type: General Level of consciousness: awake and alert Pain management: pain level controlled Vital Signs Assessment: post-procedure vital signs reviewed and stable Respiratory status: spontaneous breathing, nonlabored ventilation, respiratory function stable and patient connected to nasal cannula oxygen Cardiovascular status: blood pressure returned to baseline and stable Postop Assessment: no apparent nausea or vomiting Anesthetic complications: no     Last Vitals:  Vitals:   08/29/17 1419 08/29/17 1439  BP: 91/65 (!) 68/46  Pulse: 78 79  Resp: 12 17  Temp:    SpO2: 100% 99%    Last Pain:  Vitals:   08/29/17 1429  TempSrc:   PainSc: 0-No pain                 Martha Clan

## 2017-08-29 NOTE — Op Note (Signed)
[facility] Gastroenterology Patient Name: Karen [name] Procedure Date: 08/29/2017 12:25 PM MRN: [id number] Account #: [id number] Date of Birth: 1945-06-11 Admit Type: Inpatient Age: 72 Room: [facility] ENDO ROOM 3 Gender: Female Note Status: Finalized Procedure:            Colonoscopy Indications:          Hematochezia Providers:            Lucilla Lame MD, MD Referring MD:         Ricardo Jericho (Referring MD) Medicines:            Propofol per Anesthesia Complications:        No immediate complications. Procedure:            Pre-Anesthesia Assessment:                       - Prior to the procedure, a History and Physical was                        performed, and patient medications and allergies were                        reviewed. The patient's tolerance of previous                        anesthesia was also reviewed. The risks and benefits of                        the procedure and the sedation options and risks were                        discussed with the patient. All questions were                        answered, and informed consent was obtained. Prior                        Anticoagulants: The patient has taken no previous                        anticoagulant or antiplatelet agents. ASA Grade                        Assessment: II - A patient with mild systemic disease.                        After reviewing the risks and benefits, the patient was                        deemed in satisfactory condition to undergo the                        procedure.                       After obtaining informed consent, the colonoscope was                        passed under direct vision. Throughout the procedure,  the patient's blood pressure, pulse, and oxygen                        saturations were monitored continuously. The                        Colonoscope was introduced through the anus and                        advanced to the  the cecum, identified by appendiceal                        orifice and ileocecal valve. The colonoscopy was                        performed without difficulty. The patient tolerated the                        procedure well. The quality of the bowel preparation                        was good. Findings:      The perianal and digital rectal examinations were normal.      A few small-mouthed diverticula were found in the descending colon,       transverse colon and ascending colon.      Multiple small-mouthed diverticula were found in the sigmoid colon.      Red blood was found in the entire colon.      There was more blood seen on the left then on the right. Impression:           - Diverticulosis in the descending colon, in the                        transverse colon and in the ascending colon.                       - Diverticulosis in the sigmoid colon.                       - Blood in the entire examined colon.                       - There was more blood seen on the left then on the                        right.                       - No specimens collected.                       - Likely a diverticulit bleed. Recommendation:       - Return patient to hospital ward for ongoing care.                       - The patient should have a bleeding scan and vascular                        consult if any further bleeding. Procedure Code(s):    --- Professional ---  45378, Colonoscopy, flexible; diagnostic, including                        collection of specimen(s) by brushing or washing, when                        performed (separate procedure) Diagnosis Code(s):    --- Professional ---                       K92.1, Melena (includes Hematochezia)                       K92.2, Gastrointestinal hemorrhage, unspecified CPT copyright 2017 American Medical Association. All rights reserved. The codes documented in this report are preliminary and upon coder review may  be  revised to meet current compliance requirements. Lucilla Lame MD, MD 08/29/2017 1:48:20 PM This report has been signed electronically. Number of Addenda: 0 Note Initiated On: 08/29/2017 12:25 PM Scope Withdrawal Time: 0 hours 26 minutes 18 seconds  Total Procedure Duration: 0 hours 33 minutes 44 seconds       [facility]

## 2017-08-29 NOTE — Progress Notes (Signed)
[place] [facility] [place] NAME: Karen [name]    MR#:  [id number]  DATE OF BIRTH:  [birth year]  SUBJECTIVE:  CHIEF COMPLAINT:   Chief Complaint  Patient presents with  . Rectal Bleeding  Patient with a large amount of dark rectal bleeding overnight, colonoscopy done the day noted for a large amount of blood within the colon-suspected due to diverticular bleeding, suggested nuclear medicine bleeding scan with vascular surgery consultation if any further worsening noted  REVIEW OF SYSTEMS:  CONSTITUTIONAL: No fever, fatigue or weakness.  EYES: No blurred or double vision.  EARS, NOSE, AND THROAT: No tinnitus or ear pain.  RESPIRATORY: No cough, shortness of breath, wheezing or hemoptysis.  CARDIOVASCULAR: No chest pain, orthopnea, edema.  GASTROINTESTINAL: No nausea, vomiting, diarrhea or abdominal pain.  GENITOURINARY: No dysuria, hematuria.  ENDOCRINE: No polyuria, nocturia,  HEMATOLOGY: No anemia, easy bruising or bleeding SKIN: No rash or lesion. MUSCULOSKELETAL: No joint pain or arthritis.   NEUROLOGIC: No tingling, numbness, weakness.  PSYCHIATRY: No anxiety or depression.   ROS  DRUG ALLERGIES:   Allergies  Allergen Reactions  . Ace Inhibitors   . Gabapentin Hives  . Lisinopril   . Lyrica [Pregabalin]     VITALS:  Blood pressure (!) 135/43, pulse 79, temperature (!) 97.1 F (36.2 C), temperature source Tympanic, resp. rate 16, height 5\' 3"  (1.6 m), weight 103.4 kg (228 lb), SpO2 100 %.  PHYSICAL EXAMINATION:  GENERAL:  72 y.o.-year-old patient lying in the bed with no acute distress.  EYES: Pupils equal, round, reactive to light and accommodation. No scleral icterus. Extraocular muscles intact.  HEENT: Head atraumatic, normocephalic. Oropharynx and nasopharynx clear.  NECK:  Supple, no jugular venous distention. No thyroid enlargement, no tenderness.  LUNGS: Normal breath sounds bilaterally, no wheezing, rales,rhonchi or  crepitation. No use of accessory muscles of respiration.  CARDIOVASCULAR: S1, S2 normal. No murmurs, rubs, or gallops.  ABDOMEN: Soft, nontender, nondistended. Bowel sounds present. No organomegaly or mass.  EXTREMITIES: No pedal edema, cyanosis, or clubbing.  NEUROLOGIC: Cranial nerves II through XII are intact. Muscle strength 5/5 in all extremities. Sensation intact. Gait not checked.  PSYCHIATRIC: The patient is alert and oriented x 3.  SKIN: No obvious rash, lesion, or ulcer.   Physical Exam LABORATORY PANEL:   CBC Recent Labs  Lab 08/29/17 0321  WBC 14.6*  HGB 8.7*  HCT 26.2*  PLT 231   ------------------------------------------------------------------------------------------------------------------  Chemistries  Recent Labs  Lab 08/27/17 0228 08/29/17 0321  NA 137 136  K 4.3 3.6  CL 101 102  CO2 28 29  GLUCOSE 182* 279*  BUN 26* 8  CREATININE 0.81 0.58  CALCIUM 8.6* 8.2*  AST 11*  --   ALT 12*  --   ALKPHOS 42  --   BILITOT 0.6  --    ------------------------------------------------------------------------------------------------------------------  Cardiac Enzymes No results for input(s): TROPONINI in the last 168 hours. ------------------------------------------------------------------------------------------------------------------  RADIOLOGY:  No results found.  ASSESSMENT AND PLAN:  [name] a72 y.o.femalewith a known history of insulin requiring diabetes metas, hypertension, hyperlipidemia, chronic CHF, chronic COPD and other medical problems is presenting to the ED with a chief complaint of rectal bleeding. Patient had 3 episodes of bright red blood per rectum today and patient came into the emergency departmen  *Acute hematochezia/GIB Noted large amount of dark rectal bleeding overnight colonoscopy done August 29, 2017 by Dr. Allen Norris noted for large amount of blood within the colon-suspected most likely due to diverticular bleeding,  recommended nuclear medicine bleeding scan with vascular surgery consultation if any acute worsening going forward, continue PPI, check H&H now, CBC daily, transfuse as needed, and continue to avoid anticoagulants/antiplatelet/NSAID medications  *Chronic diabetes mellitus type 2  Stable Sliding scale insulin with Accu-Cheks per routine  *chronic CHF without exacerbation  Stable Continue current regiment  *COPD no exacerbation Stable Breathing treatments as needed  *ChronicHyperlipidemia Stable Continue statinrx  Disposition pending clinical course  All the records are reviewed and case discussed with Care Management/Social Workerr. Management plans discussed with the patient, family and they are in agreement.  CODE STATUS: full  TOTAL TIME TAKING CARE OF THIS PATIENT: 35 minutes.     POSSIBLE D/C IN 1-3 DAYS, DEPENDING ON CLINICAL CONDITION.   Karen [name] M.D on 08/29/2017   Between 7am to 6pm - Pager - [phone]  After 6pm go to [email or web address] - password [email or web address] [facility]  Office  [phone]  CC: Primary care physician; White, Orlene Och, NP  Note: This dictation was prepared with Dragon dictation along with smaller phrase technology. Any transcriptional errors that result from this process are unintentional.

## 2017-08-29 NOTE — Care Management Obs Status (Signed)
[place] NOTIFICATION   Patient Details  Name: Sary Bogie MRN: [id number] Date of Birth: June 06, 1945   Medicare Observation Status Notification Given:  Yes    Beverly Sessions, RN 08/29/2017, 4:26 PM

## 2017-08-29 NOTE — Progress Notes (Addendum)
Inpatient Diabetes Program Recommendations  AACE/ADA: New Consensus Statement on Inpatient Glycemic Control (2019)  Target Ranges:  Prepandial:   less than 140 mg/dL      Peak postprandial:   less than 180 mg/dL (1-2 hours)      Critically ill patients:  140 - 180 mg/dL   Results for Karen [name], Karen [name] (MRN [id number]) as of 08/29/2017 13:22  Ref. Range 08/28/2017 05:09 08/28/2017 11:42 08/28/2017 16:57 08/29/2017 01:02 08/29/2017 04:16  Glucose-Capillary Latest Ref Range: 70 - 99 mg/dL 176 (H) 177 (H) 241 (H) 268 (H) 249 (H)   Review of Glycemic Control  Diabetes history: DM2 Outpatient Diabetes medications: Metformin 1000 mg BID, Invokana 100 mg daily, V-GO 40 Disposable Insulin Pump (provides 40 units of basal insulin per 24 hours; also allows patient to use clicks to give additional units of insulin with meals as each click is 2 units) Current orders for Inpatient glycemic control: Novolog 0-15 Q6H  Inpatient Diabetes Program Recommendations: Insulin - Basal: While inpatient, please consider ordering Lantus 20 units Q24H. Insulin-Correction: If diet will be resumed, please consider changing frequency of CBGs and Novolog to ACHS.  NOTE: Patient is NPO and currently in Endo for procedure and ordered Novolog correction Q6H. Per chart review, note patient uses a V-Go 40 disposable insulin pump as an outpatient. The V-Go 40 provides 40 units for basal insulin per 24 hours and allows patient to use clicks to give additional insulin for meal coverage. Patient's Endocrinologist is Dr. Honor Junes and patient was last seen on 07/20/17. Per Care Everywhere, patient instructions at 07/20/17 visit was as follows:  1. Continue Metformin 1000 mg twice 2. Continue Invokana 100 mg daily 3. Continue V-Go 40 plus 1,2,8 clicks for small, medium, large meal 4. Continue to check blood sugars as you are now. We will retry to get Free Style approved   Thanks, Barnie Alderman, RN, MSN, CDE Diabetes  Coordinator Inpatient Diabetes Program [phone] (Team Pager from 8am to 5pm)

## 2017-08-30 DIAGNOSIS — K5731 Diverticulosis of large intestine without perforation or abscess with bleeding: Secondary | ICD-10-CM | POA: Diagnosis not present

## 2017-08-30 LAB — CBC
HCT: 21.2 % — ABNORMAL LOW (ref 35.0–47.0)
Hemoglobin: 7.1 g/dL — ABNORMAL LOW (ref 12.0–16.0)
MCH: 30.7 pg (ref 26.0–34.0)
MCHC: 33.6 g/dL (ref 32.0–36.0)
MCV: 91.2 fL (ref 80.0–100.0)
PLATELETS: 234 10*3/uL (ref 150–440)
RBC: 2.32 MIL/uL — AB (ref 3.80–5.20)
RDW: 14.9 % — AB (ref 11.5–14.5)
WBC: 11.2 10*3/uL — AB (ref 3.6–11.0)

## 2017-08-30 LAB — BASIC METABOLIC PANEL WITH GFR
Anion gap: 8 (ref 5–15)
BUN: 13 mg/dL (ref 8–23)
CO2: 27 mmol/L (ref 22–32)
Calcium: 8.2 mg/dL — ABNORMAL LOW (ref 8.9–10.3)
Chloride: 101 mmol/L (ref 98–111)
Creatinine, Ser: 0.5 mg/dL (ref 0.44–1.00)
GFR calc Af Amer: >60 mL/min
GFR calc non Af Amer: >60 mL/min
Glucose, Bld: 245 mg/dL — ABNORMAL HIGH (ref 70–99)
Potassium: 3.9 mmol/L (ref 3.5–5.1)
Sodium: 136 mmol/L (ref 135–145)

## 2017-08-30 LAB — GLUCOSE, CAPILLARY: Glucose-Capillary: 255 mg/dL — ABNORMAL HIGH (ref 70–99)

## 2017-08-30 NOTE — Progress Notes (Signed)
08/31/2017 12:11 PM   Theo Dills [birth year] [id number]  Referring provider: Ricardo Jericho, NP [address] [place], [place] [address]  Chief Complaint  Patient presents with  . Urinary Incontinence    3wk    HPI: 72 year old WF with urinary incontinence, vaginal atrophy and a right renal stone who presents today for follow up.  Background history Patient is a 72 -year-old Caucasian female who is referred to [place] by Ricardo Jericho, NP for urinary incontinence.  Patient states that she has had urinary incontinence for over one year.  She states that she stands up and it just pours out.  It is going through a whole pad of thick pads daily.   She is having incontinence with urgency and SUI.  She is having associated urinary frequency x "quite a lot", strong urgency and nocturia x 3-4.   She does not have a history of urinary tract infections or injury to the bladder. She denies dysuria, gross hematuria, suprapubic pain, back pain, abdominal pain or flank pain.  She has not had any recent fevers, chills, nausea or vomiting.   She does not have a history of nephrolithiasis, GU surgery or GU trauma.  She is post menopausal.  She admits to diarrhea once in a while.   She is drinking 3 to 4 bottles of water daily.   She does not drink soda.  She does not drink tea.  No juices.  No alcohol.  She drinks two cups of coffee daily.  Her risk factors for incontinence are age, smoking (former), caffeine, diabetes and vaginal atrophy.   Her PVR is 25 mL.    Urinary incontinence The patient has been experiencing urgency x 8 or more, frequency x 8 or more, not estricting fluids to avoid visits to the restroom, is engaging in toilet mapping, incontinence x 8 or more and nocturia x 0-3.  Her BP is 108/51.  Her PVR is 0 mL.  Patient denies any gross hematuria, dysuria or suprapubic/flank pain.  Patient denies any fevers, chills, nausea or vomiting.   Recently discharged from  hospital from diverticulitis   Vaginal atrophy Not on vaginal cream.    Right renal stone Found on CT.  No flank pain or hematuria.    PMH: Past Medical History:  Diagnosis Date  . Acute on chronic respiratory failure with hypoxia and hypercapnia ([place]) 01/07/2015  . Anemia   . 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 Cataract   . CHF (congestive heart failure) ([place])   . Chronic kidney disease 08/10/2017  . CKD (chronic kidney disease)   . COPD (chronic obstructive pulmonary disease) ([place])   . Diabetes mellitus without complication ([place])   . Edema, peripheral 04/20/2014  . Hypertension   . Iron deficiency anemia 06/22/2014  . Leucocytosis 10/19/2015  . Primary osteoarthritis of right knee 09/01/2016  . Renal insufficiency   . Sciatica 01/07/2015    Surgical History: Past Surgical History:  Procedure Laterality Date  . CESAREAN SECTION    . CHOLECYSTECTOMY    . EYE SURGERY      Home Medications:  Allergies as of 08/31/2017      Reactions   Ace Inhibitors    Gabapentin Hives   Lisinopril    Lyrica [pregabalin]       Medication List        Accurate as of 08/31/17 12:11 PM. Always use your most recent med list.          atorvastatin 10  MG tablet Commonly known as:  LIPITOR Take 1 tablet by mouth daily.   BREO ELLIPTA 100-25 MCG/INH Aepb Generic drug:  fluticasone furoate-vilanterol Inhale 1 puff into the lungs daily.   conjugated estrogens vaginal cream Commonly known as:  PREMARIN Apply 0.76m (pea-sized amount)  just inside the vaginal introitus with a finger-tip on  Monday, Wednesday and Friday nights.   estradiol 0.1 MG/GM vaginal cream Commonly known as:  ESTRACE VAGINAL Apply 0.570m(pea-sized amount)  just inside the vaginal introitus with a finger-tip on Monday, Wednesday and Friday nights.   HUMALOG 100 UNIT/ML injection Generic drug:  insulin lispro Inject 76 Units into the skin daily. In VGO device   hydrochlorothiazide 25 MG tablet Commonly known as:   HYDRODIURIL Take 25 mg by mouth daily.   INVOKANA 100 MG Tabs tablet Generic drug:  canagliflozin Take 1 tablet by mouth daily.   metFORMIN 500 MG tablet Commonly known as:  GLUCOPHAGE Take 1,000 mg by mouth 2 (two) times daily with a meal.   montelukast 10 MG tablet Commonly known as:  SINGULAIR Take 10 mg by mouth at bedtime.   OXYGEN Place 3 L/min into the nose.   V-GO 40 Kit Inject into the skin as directed.       Allergies:  Allergies  Allergen Reactions  . Ace Inhibitors   . Gabapentin Hives  . Lisinopril   . Lyrica [Pregabalin]     Family History: Family History  Family history unknown: Yes    Social History:  reports that she has quit smoking. She has never used smokeless tobacco. She reports that she does not drink alcohol or use drugs.  ROS: UROLOGY Frequent Urination?: Yes Hard to postpone urination?: Yes Burning/pain with urination?: No Get up at night to urinate?: Yes Leakage of urine?: Yes Urine stream starts and stops?: No Trouble starting stream?: No Do you have to strain to urinate?: No Blood in urine?: No Urinary tract infection?: No Sexually transmitted disease?: No Injury to kidneys or bladder?: No Painful intercourse?: No Weak stream?: No Currently pregnant?: No Vaginal bleeding?: No Last menstrual period?: n  Gastrointestinal Nausea?: No Vomiting?: No Indigestion/heartburn?: No Diarrhea?: No Constipation?: No  Constitutional Fever: No Night sweats?: No Weight loss?: No Fatigue?: Yes  Skin Skin rash/lesions?: No Itching?: Yes  Eyes Blurred vision?: No Double vision?: No  Ears/Nose/Throat Sore throat?: No Sinus problems?: Yes  Hematologic/Lymphatic Swollen glands?: No Easy bruising?: Yes  Cardiovascular Leg swelling?: No Chest pain?: No  Respiratory Cough?: No Shortness of breath?: No  Endocrine Excessive thirst?: Yes  Musculoskeletal Back pain?: Yes Joint pain?: Yes  Neurological Headaches?:  No Dizziness?: No  Psychologic Depression?: No Anxiety?: No  Physical Exam: BP (!) 108/51   Pulse 78   Ht _0  (1.6 m)   Wt 228 lb (103.4 kg)   BMI 40.39 kg/m   Weight 220lbs Constitutional: Well nourished. Alert and oriented, No acute distress. HEENT: [place] AT, moist mucus membranes. Trachea midline, no masses. Cardiovascular: No clubbing, cyanosis, or edema. Respiratory: Normal respiratory effort, no increased work of breathing. Skin: No rashes, bruises or suspicious lesions. Lymph: No cervical or inguinal adenopathy. Neurologic: Grossly intact, no focal deficits, moving all 4 extremities. Psychiatric: Normal mood and affect.   Laboratory Data: Lab Results  Component Value Date   WBC 11.2 (H) 08/30/2017   HGB 7.1 (L) 08/30/2017   HCT 21.2 (L) 08/30/2017   MCV 91.2 08/30/2017   PLT 234 08/30/2017    Lab Results  Component Value Date  CREATININE 0.50 08/30/2017    No results found for: PSA  No results found for: TESTOSTERONE  Lab Results  Component Value Date   HGBA1C 6.6 (H) 01/07/2015    No results found for: TSH  No results found for: CHOL, HDL, CHOLHDL, VLDL, LDLCALC  Lab Results  Component Value Date   AST 11 (L) 08/27/2017   Lab Results  Component Value Date   ALT 12 (L) 08/27/2017   No components found for: ALKALINEPHOPHATASE No components found for: BILIRUBINTOTAL  No results found for: ESTRADIOL  Urinalysis    Component Value Date/Time   COLORURINE STRAW (A) 06/23/2016 [place] 06/23/2016 1051   APPEARANCEUR [place] 04/02/2014 0517   LABSPEC 1.015 06/23/2016 1051   LABSPEC 1.024 04/02/2014 0517   PHURINE 5.5 06/23/2016 1051   GLUCOSEU 500 (A) 06/23/2016 1051   GLUCOSEU >=500 04/02/2014 0517   HGBUR NEGATIVE 06/23/2016 1051   BILIRUBINUR NEGATIVE 06/23/2016 1051   BILIRUBINUR Negative 04/02/2014 [place] 06/23/2016 1051   PROTEINUR NEGATIVE 06/23/2016 1051   NITRITE NEGATIVE 06/23/2016 1051    LEUKOCYTESUR NEGATIVE 06/23/2016 1051   LEUKOCYTESUR Trace 04/02/2014 0517    I have reviewed the labs.   Pertinent Imaging: CLINICAL DATA:  rectal bleeding. The symptoms started this afternoon. Patient states she had 3 episodes at home. Was bright red. She states it would fill the toilet. This was accompanied by some lower abdominal pain and discomfort. While here in the emergency department she had another bloody bowel movement. She denies similar symptoms in the past. Denies history of hemorrhoids or straining. Denies having undergone colonoscopy in the past. Denies any history of intestinal disorder for herself or in her family.  EXAM: CTA ABDOMEN AND PELVIS wITHOUT AND WITH CONTRAST  TECHNIQUE: Multidetector CT imaging of the abdomen and pelvis was performed using the standard protocol before and during bolus administration of intravenous contrast. Multiplanar reconstructed images and MIPs were obtained and reviewed to evaluate the vascular anatomy.  CONTRAST:  168m ISOVUE-370 IOPAMIDOL (ISOVUE-370) INJECTION 76%  COMPARISON:  04/02/2014  FINDINGS: VASCULAR  Aorta: Scattered atheromatous calcified plaque in the infrarenal segment. No aneurysm, dissection, or stenosis.  Celiac: Patent without evidence of aneurysm, dissection, vasculitis or significant stenosis.  SMA: Patent without evidence of aneurysm, dissection, vasculitis or significant stenosis. Minimal eccentric nonocclusive calcified plaque.  Renals: Both renal arteries are patent without evidence of aneurysm, dissection, vasculitis, fibromuscular dysplasia or significant stenosis.  IMA: Patent without evidence of aneurysm, dissection, vasculitis or significant stenosis.  Inflow: Patent without evidence of aneurysm, dissection, vasculitis or significant stenosis. Mild scattered calcified plaque.  Proximal Outflow: Bilateral common femoral and visualized portions of the superficial and  profunda femoral arteries are patent without evidence of aneurysm, dissection, vasculitis or significant stenosis.  Veins: Patent hepatic veins, portal vein, SMV, splenic vein, bilateral renal veins, IVC, iliac venous system. Bilateral pelvic phleboliths.  Review of the MIP images confirms the above findings.  NON-VASCULAR  Lower chest: No acute abnormality.  Hepatobiliary: No focal liver abnormality is seen. Status post cholecystectomy. No biliary dilatation.  Pancreas: Mild diffuse atrophy.  No mass or ductal dilatation.  Spleen: Normal in size without focal abnormality.  Adrenals/Urinary Tract: Normal adrenal glands. 9 mm calculus in the lower pole right renal collecting system. No hydronephrosis. No focal renal lesion. Ureters decompressed. Urinary bladder incompletely distended.  Stomach/Bowel: No evidence of extravasation. Stomach and small bowel are decompressed. Appendix not identified. No pericecal inflammatory/edematous change. Colon is nondilated. Ascending, descending,  and sigmoid colon diverticula without significant adjacent inflammatory/edematous change or abscess.  Lymphatic: Hyperdense normal-sized bilateral common iliac, right external iliac, and bilateral inguinal lymph nodes, stable. No abdominal adenopathy.  Reproductive: Uterus and bilateral adnexa are unremarkable.  Other: No ascites.  No free air.  Musculoskeletal: Facet DJD in the lower lumbar spine probably accounting for the grade 1 anterolisthesis L4-5, stable since prior study. Negative for fracture or worrisome bone lesion.  IMPRESSION: VASCULAR  1. Negative for active extravasation or vascular lesion to suggest an etiology of GI bleeding. 2.  Aortoiliac atherosclerosis (ICD10-170.0)  NON-VASCULAR  1. No acute findings. 2. Extensive colonic diverticulosis. 3. Right nonobstructive nephrolithiasis.   Electronically Signed   By: Lucrezia Europe M.D.   On: 08/27/2017  11:00 CLINICAL DATA:  Clinical findings of uterine masses. History of incontinence and urinary urgency. Three previous cesarean sections.  EXAM: TRANSABDOMINAL AND TRANSVAGINAL ULTRASOUND OF PELVIS  TECHNIQUE: Both transabdominal and transvaginal ultrasound examinations of the pelvis were performed. Transabdominal technique was performed for global imaging of the pelvis including uterus, ovaries, adnexal regions, and pelvic cul-de-sac. It was necessary to proceed with endovaginal exam following the transabdominal exam to visualize the uterus, endometrium, ovaries, and adnexal regions.  COMPARISON:  Abdominopelvic CT scan of April 02, 2014  FINDINGS: The transabdominal images are somewhat limited due to the fact patient's urinary bladder was not full.  Uterus  Measurements: 5.2 x 2.7 x 3.3 cm. The myometrial echotexture is heterogeneous. No discrete fibroids or other masses are observed.  Endometrium  Thickness: 3.1 mm.  No focal abnormality visualized.  Right ovary  The right ovary could not be visualized. No adnexal masses were observed.  Left ovary  The left ovary could not be visualized. No adnexal masses were observed.  Other findings  No abnormal free fluid.  IMPRESSION: Normal sized uterus with heterogeneous echotexture. No discrete fibroids are observed. Normal appearing endometrium.  Nonvisualization of the ovaries which is not unusual at the patient's age. Normal appearing adnexal structures.   Electronically Signed   By: David  Martinique M.D.   On: 08/21/2017 15:06  Results for HIILEI, GERST (MRN [id number]) as of 08/31/2017 12:04  Ref. Range 08/31/2017 11:25  Scan Result Unknown 81m   I have independently reviewed the films    Assessment & Plan:    1. Incontinence Patient found the Myrbetriq 25 mg daily helpful, but she was not on the medication during her hospitalization for diverticulitis - Given Myrbetriq 25 mg  samples, #28.  I have reviewed with the patient of the side effects of Myrbetriq, such as: elevation in BP, urinary retention and/or HA.   RTC in 6 weeks for PVR and OAB questionnaire   2. Uterine mass No mass  3. Vaginal atrophy Will hold prescribing vaginal estrogen cream until pelvic ultrasound results are available  4. Right renal stone 9 mm right lower pole stone seen on CT scan Will address once patient has recovered from diverticulitis   Return in about 6 weeks (around 10/12/2017) for PVR and OAB questionnaire.  These notes generated with voice recognition software. I apologize for typographical errors.  SZara Council PA-C  [facility] [address] Augusta StreetSFort GayBSt. Paul Tiburones [phone]

## 2017-08-30 NOTE — Discharge Summary (Signed)
[facility] NAME: Karen [name]    MR#:  [id number]  DATE OF BIRTH:  Jun 24, 1945  DATE OF ADMISSION:  08/26/2017 ADMITTING PHYSICIAN: Nicholes Mango, MD  DATE OF DISCHARGE: 08/30/2017  PRIMARY CARE PHYSICIAN: Ricardo Jericho, NP    ADMISSION DIAGNOSIS:  Acute GI bleeding [K92.2]  DISCHARGE DIAGNOSIS:  rectal bleeding appears diverticular acute anemia secondary to G.I. bleed  SECONDARY DIAGNOSIS:   Past Medical History:  Diagnosis Date  . Acute on chronic respiratory failure with hypoxia and hypercapnia ([place]) 01/07/2015  . Anemia   . 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 Cataract   . CHF (congestive heart failure) ([place])   . Chronic kidney disease 08/10/2017  . CKD (chronic kidney disease)   . COPD (chronic obstructive pulmonary disease) ([place])   . Diabetes mellitus without complication ([place])   . Edema, peripheral 04/20/2014  . Hypertension   . Iron deficiency anemia 06/22/2014  . Leucocytosis 10/19/2015  . Primary osteoarthritis of right knee 09/01/2016  . Renal insufficiency   . Sciatica 01/07/2015    HOSPITAL COURSE:   [name] a72 y.o.femalewith a known history of insulin requiring diabetes metas, hypertension, hyperlipidemia, chronic CHF, chronic COPD and other medical problems is presenting to the ED with a chief complaint of rectal bleeding. Patient had 3 episodes of bright red blood per rectum today and patient came into the emergency departmen  *Acute lower GIB Noted large amount of dark rectal bleeding overnight colonoscopy done August 29, 2017 by Dr. Allen Norris noted for large amount of blood within the colon-suspected most likely due to diverticular bleeding -if re-bleeds G.I. recommended nuclear medicine bleeding scan with vascular surgery  -patient tolerating regular soft diet. She has not had bowel movement today. Her bowel movement yesterday after colonoscopy did not show any more blood. -Is feeling fine.  She is requesting to go home. Husband in the room. Discussed diet at home. -Hemoglobin 12.0-- 10.6--- 8.7--- 7.5--- 7.0--- 7.1 -patient currently not bleeding activity. No indication for blood transfusion at present. -Advised her to get CBC next week with her primary care physician  *Chronic diabetes mellitus type 2  Sliding scale insulin with Accu-Cheks per routine -resume home meds  *chronic CHF without exacerbation  Stable Continue current regiment  *COPD no exacerbation Stable Breathing treatments as needed -Tronic home oxygen  *ChronicHyperlipidemia Stable Continue statinrx  patient overall feels better. She is tolerating regular diet. Discharge plan discussed with patient and husband. They both agreeable for going home today. This with G.I. No further recommendations. Ddiscussed with patient given the extent of diverticulosis she can have diverticular bleed in near future. She was advised on strict dietary recommendations follow according to instructions given.   CONSULTS OBTAINED:  Treatment Team:  Lucilla Lame, MD  DRUG ALLERGIES:   Allergies  Allergen Reactions  . Ace Inhibitors   . Gabapentin Hives  . Lisinopril   . Lyrica [Pregabalin]     DISCHARGE MEDICATIONS:   Allergies as of 08/30/2017      Reactions   Ace Inhibitors    Gabapentin Hives   Lisinopril    Lyrica [pregabalin]       Medication List    STOP taking these medications   aspirin EC 81 MG tablet     TAKE these medications   atorvastatin 10 MG tablet Commonly known as:  LIPITOR Take 1 tablet by mouth daily.   BREO ELLIPTA 100-25 MCG/INH Aepb Generic drug:  fluticasone furoate-vilanterol Inhale 1 puff into the  lungs daily.   HUMALOG 100 UNIT/ML injection Generic drug:  insulin lispro Inject 76 Units into the skin daily. In VGO device   hydrochlorothiazide 25 MG tablet Commonly known as:  HYDRODIURIL Take 25 mg by mouth daily.   INVOKANA 100 MG Tabs tablet Generic  drug:  canagliflozin Take 1 tablet by mouth daily.   metFORMIN 500 MG tablet Commonly known as:  GLUCOPHAGE Take 1,000 mg by mouth 2 (two) times daily with a meal.   montelukast 10 MG tablet Commonly known as:  SINGULAIR Take 10 mg by mouth at bedtime.   OXYGEN Place 3 L/min into the nose.   V-GO 40 Kit Inject into the skin as directed.       If you experience worsening of your admission symptoms, develop shortness of breath, life threatening emergency, suicidal or homicidal thoughts you must seek medical attention immediately by calling 911 or calling your MD immediately  if symptoms less severe.  You Must read complete instructions/literature along with all the possible adverse reactions/side effects for all the Medicines you take and that have been prescribed to you. Take any new Medicines after you have completely understood and accept all the possible adverse reactions/side effects.   Please note  You were cared for by a hospitalist during your hospital stay. If you have any questions about your discharge medications or the care you received while you were in the hospital after you are discharged, you can call the unit and asked to speak with the hospitalist on call if the hospitalist that took care of you is not available. Once you are discharged, your primary care physician will handle any further medical issues. Please note that NO REFILLS for any discharge medications will be authorized once you are discharged, as it is imperative that you return to your primary care physician (or establish a relationship with a primary care physician if you do not have one) for your aftercare needs so that they can reassess your need for medications and monitor your lab values. Today   SUBJECTIVE   Patient had bowel movement yesterday without any blood. Tolerating regular diet. Feels overall better. No abdominal pain. Husband in the room.  VITAL SIGNS:  Blood pressure (!) 127/54, pulse 98,  temperature 99.7 F (37.6 C), temperature source Oral, resp. rate 20, height '5\' 3"'  (1.6 m), weight 103.4 kg (228 lb), SpO2 94 %.  I/O:    Intake/Output Summary (Last 24 hours) at 08/30/2017 1031 Last data filed at 08/30/2017 1014 Gross per 24 hour  Intake 1210.83 ml  Output 200 ml  Net 1010.83 ml    PHYSICAL EXAMINATION:  GENERAL:  72 y.o.-year-old patient lying in the bed with no acute distress. Mild pallor EYES: Pupils equal, round, reactive to light and accommodation. No scleral icterus. Extraocular muscles intact.  HEENT: Head atraumatic, normocephalic. Oropharynx and nasopharynx clear.  NECK:  Supple, no jugular venous distention. No thyroid enlargement, no tenderness.  LUNGS: Normal breath sounds bilaterally, no wheezing, rales,rhonchi or crepitation. No use of accessory muscles of respiration.  CARDIOVASCULAR: S1, S2 normal. No murmurs, rubs, or gallops.  ABDOMEN: Soft, non-tender, non-distended. Bowel sounds present. No organomegaly or mass.  EXTREMITIES: No pedal edema, cyanosis, or clubbing.  NEUROLOGIC: Cranial nerves II through XII are intact. Muscle strength 5/5 in all extremities. Sensation intact. Gait not checked.  PSYCHIATRIC: The patient is alert and oriented x 3.  SKIN: No obvious rash, lesion, or ulcer.   DATA REVIEW:   CBC  Recent Labs  Lab  08/30/17 0517  WBC 11.2*  HGB 7.1*  HCT 21.2*  PLT 234    Chemistries  Recent Labs  Lab 08/27/17 0228  08/30/17 0517  NA 137   < > 136  K 4.3   < > 3.9  CL 101   < > 101  CO2 28   < > 27  GLUCOSE 182*   < > 245*  BUN 26*   < > 13  CREATININE 0.81   < > 0.50  CALCIUM 8.6*   < > 8.2*  AST 11*  --   --   ALT 12*  --   --   ALKPHOS 42  --   --   BILITOT 0.6  --   --    < > = values in this interval not displayed.    Microbiology Results   No results found for this or any previous visit (from the past 240 hour(s)).  RADIOLOGY:  No results found.   Management plans discussed with the patient, family  and they are in agreement.  CODE STATUS:     Code Status Orders  (From admission, onward)        Start     Ordered   08/26/17 2217  Full code  Continuous     08/26/17 2216    Code Status History    Date Active Date Inactive Code Status Order ID Comments User Context   01/08/2015 0307 01/09/2015 1336 Full Code [id number]  Theodoro Grist, MD Inpatient      TOTAL TIME TAKING CARE OF THIS PATIENT: *40* minutes.    Fritzi Mandes M.D on 08/30/2017 at 10:31 AM  Between 7am to 6pm - Pager - [phone] After 6pm go to [email or web address] - password [email or web address] [facility]  Office  [phone]  CC: Primary care physician; Ricardo Jericho, NP

## 2017-08-30 NOTE — Progress Notes (Addendum)
Inpatient Diabetes Program Recommendations  AACE/ADA: New Consensus Statement on Inpatient Glycemic Control (2019)  Target Ranges:  Prepandial:   less than 140 mg/dL      Peak postprandial:   less than 180 mg/dL (1-2 hours)      Critically ill patients:  140 - 180 mg/dL  Results for Karen [name], Karen [name] (MRN [id number]) as of 08/30/2017 08:59  Ref. Range 08/29/2017 15:44 08/29/2017 17:24 08/29/2017 21:58 08/30/2017 07:23  Glucose-Capillary Latest Ref Range: 70 - 99 mg/dL 241 (H) 278 (H) 352 (H) 255 (H)   Results for Karen [name], Karen [name] (MRN [id number]) as of 08/29/2017 13:22  Ref. Range 08/28/2017 05:09 08/28/2017 11:42 08/28/2017 16:57 08/29/2017 01:02 08/29/2017 04:16  Glucose-Capillary Latest Ref Range: 70 - 99 mg/dL 176 (H) 177 (H) 241 (H) 268 (H) 249 (H)   Review of Glycemic Control  Diabetes history: DM2 Outpatient Diabetes medications: Metformin 1000 mg BID, Invokana 100 mg daily, V-GO 40 Disposable Insulin Pump (provides 40 units of basal insulin per 24 hours; also allows patient to use clicks to give additional units of insulin with meals as each click is 2 units) Current orders for Inpatient glycemic control: Novolog 0-15 units ACHS  Inpatient Diabetes Program Recommendations: Insulin - Basal: While inpatient, please consider ordering Lantus 20 units Q24H (starting now). Insulin-Meal Coverage: Please consider ordering Novolog 5 units TID with meals for meal coverage if patient eats at least 50% of meals.  NOTE:  Per chart review, note patient uses a V-Go 40 disposable insulin pump as an outpatient. The V-Go 40 provides 40 units for basal insulin per 24 hours and allows patient to use clicks to give additional insulin for meal coverage. Patient's Endocrinologist is Dr. Honor Junes and patient was last seen on 07/20/17. Per Care Everywhere, patient instructions at 07/20/17 visit was as follows:  1. Continue Metformin 1000 mg twice 2. Continue Invokana 100 mg daily 3. Continue V-Go 40 plus  5,3,7 clicks for small, medium, large meal 4. Continue to check blood sugars as you are now. We will retry to get Free Style approved   Thanks, Barnie Alderman, RN, MSN, CDE Diabetes Coordinator Inpatient Diabetes Program [phone] (Team Pager from 8am to 5pm)

## 2017-08-31 ENCOUNTER — Encounter: Payer: Self-pay | Admitting: Urology

## 2017-08-31 ENCOUNTER — Ambulatory Visit (INDEPENDENT_AMBULATORY_CARE_PROVIDER_SITE_OTHER): Payer: Medicare Other | Admitting: Urology

## 2017-08-31 VITALS — BP 108/51 | HR 78 | Ht 63.0 in | Wt 228.0 lb

## 2017-08-31 DIAGNOSIS — N200 Calculus of kidney: Secondary | ICD-10-CM | POA: Diagnosis not present

## 2017-08-31 DIAGNOSIS — N952 Postmenopausal atrophic vaginitis: Secondary | ICD-10-CM | POA: Diagnosis not present

## 2017-08-31 DIAGNOSIS — R32 Unspecified urinary incontinence: Secondary | ICD-10-CM

## 2017-08-31 LAB — BLADDER SCAN AMB NON-IMAGING

## 2017-08-31 MED ORDER — ESTROGENS, CONJUGATED 0.625 MG/GM VA CREA
TOPICAL_CREAM | VAGINAL | 12 refills | Status: DC
Start: 2017-08-31 — End: 2017-09-28

## 2017-08-31 MED ORDER — ESTRADIOL 0.1 MG/GM VA CREA: TOPICAL_CREAM | VAGINAL | 12 refills | Status: DC

## 2017-08-31 MED ORDER — MIRABEGRON ER 25 MG PO TB24: 25.0000 mg | ORAL_TABLET | Freq: Every day | ORAL | 12 refills | Status: DC

## 2017-09-11 ENCOUNTER — Emergency Department
Admission: EM | Admit: 2017-09-11 | Discharge: 2017-09-11 | Disposition: A | Discharge status: Home / Self Care | Payer: Medicare Other | Attending: Emergency Medicine | Admitting: Emergency Medicine

## 2017-09-11 ENCOUNTER — Other Ambulatory Visit: Payer: Self-pay

## 2017-09-11 ENCOUNTER — Encounter: Payer: Self-pay | Admitting: Emergency Medicine

## 2017-09-11 DIAGNOSIS — I130 Hypertensive heart and chronic kidney disease with heart failure and stage 1 through stage 4 chronic kidney disease, or unspecified chronic kidney disease: Secondary | ICD-10-CM | POA: Diagnosis not present

## 2017-09-11 DIAGNOSIS — Z79899 Other long term (current) drug therapy: Secondary | ICD-10-CM | POA: Diagnosis not present

## 2017-09-11 DIAGNOSIS — Z87891 Personal history of nicotine dependence: Secondary | ICD-10-CM | POA: Insufficient documentation

## 2017-09-11 DIAGNOSIS — J449 Chronic obstructive pulmonary disease, unspecified: Secondary | ICD-10-CM | POA: Insufficient documentation

## 2017-09-11 DIAGNOSIS — E1122 Type 2 diabetes mellitus with diabetic chronic kidney disease: Secondary | ICD-10-CM | POA: Insufficient documentation

## 2017-09-11 DIAGNOSIS — I509 Heart failure, unspecified: Secondary | ICD-10-CM | POA: Insufficient documentation

## 2017-09-11 DIAGNOSIS — D649 Anemia, unspecified: Secondary | ICD-10-CM | POA: Diagnosis not present

## 2017-09-11 DIAGNOSIS — N189 Chronic kidney disease, unspecified: Secondary | ICD-10-CM | POA: Diagnosis not present

## 2017-09-11 DIAGNOSIS — Z794 Long term (current) use of insulin: Secondary | ICD-10-CM | POA: Diagnosis not present

## 2017-09-11 LAB — CBC WITH DIFFERENTIAL/PLATELET
BASOS ABS: 0.1 10*3/uL (ref 0–0.1)
Basophils Relative: 1 %
EOS PCT: 4 %
Eosinophils Absolute: 0.4 10*3/uL (ref 0–0.7)
HCT: 24.3 % — ABNORMAL LOW (ref 35.0–47.0)
Hemoglobin: 8.4 g/dL — ABNORMAL LOW (ref 12.0–16.0)
Lymphocytes Relative: 16 %
Lymphs Abs: 1.7 10*3/uL (ref 1.0–3.6)
MCH: 32.1 pg (ref 26.0–34.0)
MCHC: 34.4 g/dL (ref 32.0–36.0)
MCV: 93.2 fL (ref 80.0–100.0)
MONO ABS: 0.6 10*3/uL (ref 0.2–0.9)
Monocytes Relative: 6 %
Neutro Abs: 8 10*3/uL — ABNORMAL HIGH (ref 1.4–6.5)
Neutrophils Relative %: 73 %
PLATELETS: 301 10*3/uL (ref 150–440)
RBC: 2.6 MIL/uL — AB (ref 3.80–5.20)
RDW: 16.6 % — AB (ref 11.5–14.5)
WBC: 10.9 10*3/uL (ref 3.6–11.0)

## 2017-09-11 LAB — COMPREHENSIVE METABOLIC PANEL
ALT: 14 U/L (ref 0–44)
AST: 17 U/L (ref 15–41)
Albumin: 3.6 g/dL (ref 3.5–5.0)
Alkaline Phosphatase: 52 U/L (ref 38–126)
Anion gap: 8 (ref 5–15)
BUN: 18 mg/dL (ref 8–23)
CHLORIDE: 100 mmol/L (ref 98–111)
CO2: 29 mmol/L (ref 22–32)
CREATININE: 0.89 mg/dL (ref 0.44–1.00)
Calcium: 8.9 mg/dL (ref 8.9–10.3)
Glucose, Bld: 206 mg/dL — ABNORMAL HIGH (ref 70–99)
POTASSIUM: 4.2 mmol/L (ref 3.5–5.1)
SODIUM: 137 mmol/L (ref 135–145)
Total Bilirubin: 0.7 mg/dL (ref 0.3–1.2)
Total Protein: 6.6 g/dL (ref 6.5–8.1)

## 2017-09-11 NOTE — ED Provider Notes (Signed)
[facility] Emergency Department Provider Note ____________________________________________   First MD Initiated Contact with Patient 09/11/17 1715     (approximate)  I have reviewed the triage vital signs and the nursing notes.   HISTORY  Chief Complaint Anemia  HPI Karen [name] is a 71 y.o. female with a history of recent lower GI bleeding and anemia who is presenting to the emergency department today after being told she needed a blood transfusion by her primary care doctor.  She had an outpatient blood draw which showed that her hemoglobin was 7.9.  Despite this, she denies any bleeding in her bowel movements.  Denies any black or maroon bowel movements.  Says that she is not short of breath with exertion and denies any chest pain.  Denies being on any blood thinners.  Past Medical History:  Diagnosis Date  . Acute on chronic respiratory failure with hypoxia and hypercapnia ([place]) 01/07/2015  . Anemia   . 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 Cataract   . CHF (congestive heart failure) ([place])   . Chronic kidney disease 08/10/2017  . CKD (chronic kidney disease)   . COPD (chronic obstructive pulmonary disease) ([place])   . Diabetes mellitus without complication ([place])   . Edema, peripheral 04/20/2014  . Hypertension   . Iron deficiency anemia 06/22/2014  . Leucocytosis 10/19/2015  . Primary osteoarthritis of right knee 09/01/2016  . Renal insufficiency   . Sciatica 01/07/2015    Patient Active Problem List   Diagnosis Date Noted  . Acute GI bleeding   . GI bleed 08/26/2017  . Arthritis 08/10/2017  . Chronic kidney disease 08/10/2017  . COPD (chronic obstructive pulmonary disease) ([place]) 08/10/2017  . Diabetes mellitus type 2, uncomplicated ([place]) 58/30/9407  . Hypertension 08/10/2017  . Obesity (BMI 35.0-39.9 without comorbidity) 04/11/2017  . Primary osteoarthritis of right knee 09/01/2016  . Leucocytosis 10/19/2015  . 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 Acute on chronic  respiratory failure with hypoxia and hypercapnia ([place]) 01/07/2015  . Sciatica 01/07/2015  . Weakness 01/07/2015  . 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 Iron deficiency anemia 06/22/2014  . Microalbuminuria 06/22/2014  . CHF (congestive heart failure) ([place]) 04/20/2014  . Edema, peripheral 04/20/2014    Past Surgical History:  Procedure Laterality Date  . CESAREAN SECTION    . CHOLECYSTECTOMY    . COLONOSCOPY WITH PROPOFOL N/A 08/28/2017   Procedure: COLONOSCOPY WITH PROPOFOL;  Surgeon: Lucilla Lame, MD;  Location: [facility] ENDOSCOPY;  Service: Endoscopy;  Laterality: N/A;  . COLONOSCOPY WITH PROPOFOL N/A 08/29/2017   Procedure: COLONOSCOPY WITH PROPOFOL;  Surgeon: Lucilla Lame, MD;  Location: [facility] ENDOSCOPY;  Service: Endoscopy;  Laterality: N/A;  . EYE SURGERY      Prior to Admission medications   Medication Sig Start Date End Date Taking? Authorizing Provider  atorvastatin (LIPITOR) 10 MG tablet Take 1 tablet by mouth daily.    [provider]  canagliflozin (INVOKANA) 100 MG TABS tablet Take 1 tablet by mouth daily.    [provider]  conjugated estrogens (PREMARIN) vaginal cream Apply 0.71m (pea-sized amount)  just inside the vaginal introitus with a finger-tip on  Monday, Wednesday and Friday nights. 08/31/17   MZara CouncilA, PA-C  estradiol (ESTRACE VAGINAL) 0.1 MG/GM vaginal cream Apply 0.59m(pea-sized amount)  just inside the vaginal introitus with a finger-tip on Monday, Wednesday and Friday nights. 08/31/17   McZara Council, PA-C  fluticasone furoate-vilanterol (BREO ELLIPTA) 100-25 MCG/INH AEPB Inhale 1 puff into the  lungs daily.    [provider]  HUMALOG 100 UNIT/ML injection Inject 76 Units into the skin daily. In VGO device 08/19/17   [provider]  hydrochlorothiazide (HYDRODIURIL) 25 MG tablet Take 25 mg by mouth daily. 08/10/17   [provider]  Insulin Disposable Pump (V-GO 40) KIT Inject  into the skin as directed. 08/14/17   [provider]  metFORMIN (GLUCOPHAGE) 500 MG tablet Take 1,000 mg by mouth 2 (two) times daily with a meal.     [provider]  mirabegron ER (MYRBETRIQ) 25 MG TB24 tablet Take 1 tablet (25 mg total) by mouth daily. 08/31/17   Zara Council A, PA-C  montelukast (SINGULAIR) 10 MG tablet Take 10 mg by mouth at bedtime.    [provider]  OXYGEN Place 3 L/min into the nose.     [provider]    Allergies Ace inhibitors; Gabapentin; Lisinopril; and Lyrica [pregabalin]  Family History  Family history unknown: Yes    Social History Social History   Tobacco Use  . Smoking status: Former Research scientist (life sciences)  . Smokeless tobacco: Never Used  Substance Use Topics  . Alcohol use: No  . Drug use: No    Review of Systems  Constitutional: No fever/chills Eyes: No visual changes. ENT: No sore throat. Cardiovascular: Denies chest pain. Respiratory: Denies shortness of breath. Gastrointestinal: No abdominal pain.  No nausea, no vomiting.  No diarrhea.  No constipation. Genitourinary: Negative for dysuria. Musculoskeletal: Negative for back pain. Skin: Negative for rash. Neurological: Negative for headaches, focal weakness or numbness.   ____________________________________________   PHYSICAL EXAM:  VITAL SIGNS: ED Triage Vitals  Enc Vitals Group     BP 09/11/17 1623 137/64     Pulse Rate 09/11/17 1623 86     Resp 09/11/17 1623 20     Temp 09/11/17 1623 99.1 F (37.3 C)     Temp Source 09/11/17 1623 Oral     SpO2 09/11/17 1623 97 %     Weight 09/11/17 1624 228 lb (103.4 kg)     Height 09/11/17 1624 _0  (1.6 m)     Head Circumference --      Peak Flow --      Pain Score 09/11/17 1623 0     Pain Loc --      Pain Edu? --      Excl. in [place]? --     Constitutional: Alert and oriented. Well appearing and in no acute distress. Eyes: Conjunctivae are normal.  Head: Atraumatic. Nose: No  congestion/rhinnorhea. Mouth/Throat: Mucous membranes are moist.  Neck: No stridor.   Cardiovascular: Normal rate, regular rhythm. Grossly normal heart sounds.   Respiratory: Normal respiratory effort.  No retractions. Lungs CTAB. Gastrointestinal: Soft and nontender. No distention. No CVA tenderness. Musculoskeletal: No lower extremity tenderness nor edema.  No joint effusions. Neurologic:  Normal speech and language. No gross focal neurologic deficits are appreciated. Skin:  Skin is warm, dry and intact. No rash noted. Psychiatric: Mood and affect are normal. Speech and behavior are normal.  ____________________________________________   LABS (all labs ordered are listed, but only abnormal results are displayed)  Labs Reviewed  CBC WITH DIFFERENTIAL/PLATELET - Abnormal; Notable for the following components:      Result Value   RBC 2.60 (*)    Hemoglobin 8.4 (*)    HCT 24.3 (*)    RDW 16.6 (*)    Neutro Abs 8.0 (*)    All other components within normal limits  COMPREHENSIVE METABOLIC PANEL - Abnormal; Notable for the following components:   Glucose, Bld 206 (*)    All other components within normal limits   ____________________________________________  EKG   ____________________________________________  RADIOLOGY   ____________________________________________   PROCEDURES  Procedure(s) performed:   Procedures  Critical Care performed:   ____________________________________________   INITIAL IMPRESSION / ASSESSMENT AND PLAN / ED COURSE  Pertinent labs & imaging results that were available during my care of the patient were reviewed by me and considered in my medical decision making (see chart for details).  DDX: Anemia, lab error, GI bleeding, weakness As part of my medical decision making, I reviewed the following data within the [facility] chart reviewed and Notes from prior ED visits  Patient with a hemoglobin here in the emergency  department of 8.4.  This is up from the 7.9 as an outpatient.  Also appears to be trending up throughout her recent hemoglobin levels from her recent visit.  Patient denies any symptoms at this time.  I feel that she does not merit a blood transfusion currently but would require continued trending of her hemoglobin as an outpatient.  She knows to return immediately for any worsening or concerning symptoms, especially weakness, chest pain, shortness of breath or new blood in her stool. ____________________________________________   FINAL CLINICAL IMPRESSION(S) / ED DIAGNOSES  Anemia.    NEW MEDICATIONS STARTED DURING THIS VISIT:  New Prescriptions   No medications on file     Note:  This document was prepared using Dragon voice recognition software and may include unintentional dictation errors.     Orbie Pyo, MD 09/11/17 [phone]

## 2017-09-11 NOTE — ED Notes (Addendum)
Pt reports feeling better today than she has since her procedure. No blood noted in the stool and no dark tarry stool. Pt reports she came to the ED because her PCP told her she was going to need a blood transfusion.

## 2017-09-11 NOTE — ED Triage Notes (Signed)
Pt reports that she went to her PMD for a check up and they took her blood. PMD called her and told her to come in to get some blood that she was low. Pt reports that today was the first day that she was actually feeling better. Denies any symptoms.

## 2017-09-15 ENCOUNTER — Encounter: Payer: Self-pay | Admitting: Gynecology

## 2017-09-15 ENCOUNTER — Ambulatory Visit (INDEPENDENT_AMBULATORY_CARE_PROVIDER_SITE_OTHER): Payer: Medicare Other

## 2017-09-15 ENCOUNTER — Other Ambulatory Visit: Payer: Self-pay

## 2017-09-15 ENCOUNTER — Inpatient Hospital Stay
Admission: EM | Admit: 2017-09-15 | Discharge: 2017-09-18 | DRG: 660 | Disposition: A | Discharge status: Home / Self Care | Payer: Medicare Other | Attending: Internal Medicine | Admitting: Internal Medicine

## 2017-09-15 ENCOUNTER — Inpatient Hospital Stay: Payer: Medicare Other | Admitting: Certified Registered"

## 2017-09-15 ENCOUNTER — Ambulatory Visit (INDEPENDENT_AMBULATORY_CARE_PROVIDER_SITE_OTHER)
Admission: EM | Admit: 2017-09-15 | Discharge: 2017-09-15 | Disposition: A | Discharge status: Home / Self Care | Payer: Medicare Other | Source: Home / Self Care | Attending: Emergency Medicine | Admitting: Emergency Medicine

## 2017-09-15 ENCOUNTER — Encounter
Admission: EM | Disposition: A | Discharge status: Home / Self Care | Payer: Self-pay | Source: Home / Self Care | Attending: Internal Medicine

## 2017-09-15 DIAGNOSIS — Z7951 Long term (current) use of inhaled steroids: Secondary | ICD-10-CM | POA: Diagnosis not present

## 2017-09-15 DIAGNOSIS — K5900 Constipation, unspecified: Secondary | ICD-10-CM | POA: Diagnosis not present

## 2017-09-15 DIAGNOSIS — J9612 Chronic respiratory failure with hypercapnia: Secondary | ICD-10-CM | POA: Diagnosis present

## 2017-09-15 DIAGNOSIS — N201 Calculus of ureter: Secondary | ICD-10-CM | POA: Diagnosis present

## 2017-09-15 DIAGNOSIS — N183 Chronic kidney disease, stage 3 (moderate): Secondary | ICD-10-CM | POA: Diagnosis present

## 2017-09-15 DIAGNOSIS — Z794 Long term (current) use of insulin: Secondary | ICD-10-CM

## 2017-09-15 DIAGNOSIS — N136 Pyonephrosis: Principal | ICD-10-CM | POA: Diagnosis present

## 2017-09-15 DIAGNOSIS — N135 Crossing vessel and stricture of ureter without hydronephrosis: Secondary | ICD-10-CM

## 2017-09-15 DIAGNOSIS — Z888 Allergy status to other drugs, medicaments and biological substances status: Secondary | ICD-10-CM | POA: Diagnosis not present

## 2017-09-15 DIAGNOSIS — Z87442 Personal history of urinary calculi: Secondary | ICD-10-CM

## 2017-09-15 DIAGNOSIS — N132 Hydronephrosis with renal and ureteral calculous obstruction: Secondary | ICD-10-CM

## 2017-09-15 DIAGNOSIS — R109 Unspecified abdominal pain: Secondary | ICD-10-CM | POA: Diagnosis not present

## 2017-09-15 DIAGNOSIS — Z9981 Dependence on supplemental oxygen: Secondary | ICD-10-CM

## 2017-09-15 DIAGNOSIS — I5032 Chronic diastolic (congestive) heart failure: Secondary | ICD-10-CM | POA: Diagnosis present

## 2017-09-15 DIAGNOSIS — M545 Low back pain: Secondary | ICD-10-CM

## 2017-09-15 DIAGNOSIS — Z9641 Presence of insulin pump (external) (internal): Secondary | ICD-10-CM

## 2017-09-15 DIAGNOSIS — N200 Calculus of kidney: Secondary | ICD-10-CM

## 2017-09-15 DIAGNOSIS — Z87891 Personal history of nicotine dependence: Secondary | ICD-10-CM

## 2017-09-15 DIAGNOSIS — J9611 Chronic respiratory failure with hypoxia: Secondary | ICD-10-CM | POA: Diagnosis present

## 2017-09-15 DIAGNOSIS — R1030 Lower abdominal pain, unspecified: Secondary | ICD-10-CM

## 2017-09-15 DIAGNOSIS — Q6211 Congenital occlusion of ureteropelvic junction: Secondary | ICD-10-CM | POA: Diagnosis not present

## 2017-09-15 DIAGNOSIS — N179 Acute kidney failure, unspecified: Secondary | ICD-10-CM | POA: Diagnosis present

## 2017-09-15 DIAGNOSIS — E1122 Type 2 diabetes mellitus with diabetic chronic kidney disease: Secondary | ICD-10-CM | POA: Diagnosis present

## 2017-09-15 DIAGNOSIS — I130 Hypertensive heart and chronic kidney disease with heart failure and stage 1 through stage 4 chronic kidney disease, or unspecified chronic kidney disease: Secondary | ICD-10-CM | POA: Diagnosis present

## 2017-09-15 DIAGNOSIS — N390 Urinary tract infection, site not specified: Secondary | ICD-10-CM | POA: Diagnosis not present

## 2017-09-15 DIAGNOSIS — M1711 Unilateral primary osteoarthritis, right knee: Secondary | ICD-10-CM | POA: Diagnosis present

## 2017-09-15 DIAGNOSIS — N138 Other obstructive and reflux uropathy: Secondary | ICD-10-CM | POA: Diagnosis not present

## 2017-09-15 DIAGNOSIS — Z9049 Acquired absence of other specified parts of digestive tract: Secondary | ICD-10-CM | POA: Diagnosis not present

## 2017-09-15 DIAGNOSIS — Z79890 Hormone replacement therapy (postmenopausal): Secondary | ICD-10-CM

## 2017-09-15 DIAGNOSIS — J449 Chronic obstructive pulmonary disease, unspecified: Secondary | ICD-10-CM | POA: Diagnosis present

## 2017-09-15 DIAGNOSIS — Z66 Do not resuscitate: Secondary | ICD-10-CM | POA: Diagnosis present

## 2017-09-15 DIAGNOSIS — A419 Sepsis, unspecified organism: Secondary | ICD-10-CM | POA: Diagnosis not present

## 2017-09-15 HISTORY — PX: CYSTOSCOPY W/ URETERAL STENT PLACEMENT: SHX1429

## 2017-09-15 LAB — URINALYSIS, COMPLETE (UACMP) WITH MICROSCOPIC
BILIRUBIN URINE: NEGATIVE
GLUCOSE, UA: 500 mg/dL — AB
HGB URINE DIPSTICK: NEGATIVE
Ketones, ur: NEGATIVE mg/dL
Nitrite: NEGATIVE
PH: 8.5 — AB (ref 5.0–8.0)
Protein, ur: NEGATIVE mg/dL
Specific Gravity, Urine: 1.015 (ref 1.005–1.030)

## 2017-09-15 LAB — GLUCOSE, CAPILLARY
GLUCOSE-CAPILLARY: 95 mg/dL (ref 70–99)
GLUCOSE-CAPILLARY: 133 mg/dL — AB (ref 70–99)
Glucose-Capillary: 107 mg/dL — ABNORMAL HIGH (ref 70–99)

## 2017-09-15 LAB — APTT: APTT: 26 s (ref 24–36)

## 2017-09-15 LAB — BASIC METABOLIC PANEL
Anion gap: 12 (ref 5–15)
BUN: 19 mg/dL (ref 8–23)
CHLORIDE: 94 mmol/L — AB (ref 98–111)
CO2: 34 mmol/L — AB (ref 22–32)
CREATININE: 1.5 mg/dL — AB (ref 0.44–1.00)
Calcium: 10 mg/dL (ref 8.9–10.3)
GFR calc non Af Amer: 33 mL/min — ABNORMAL LOW (ref >60)
GFR, EST AFRICAN AMERICAN: 39 mL/min — AB (ref >60)
GLUCOSE: 229 mg/dL — AB (ref 70–99)
Potassium: 4.3 mmol/L (ref 3.5–5.1)
Sodium: 140 mmol/L (ref 135–145)

## 2017-09-15 LAB — CBC
HCT: 27.6 % — ABNORMAL LOW (ref 35.0–47.0)
Hemoglobin: 9.2 g/dL — ABNORMAL LOW (ref 12.0–16.0)
MCH: 30.7 pg (ref 26.0–34.0)
MCHC: 33.4 g/dL (ref 32.0–36.0)
MCV: 92.1 fL (ref 80.0–100.0)
PLATELETS: 271 10*3/uL (ref 150–440)
RBC: 3 MIL/uL — AB (ref 3.80–5.20)
RDW: 15.8 % — ABNORMAL HIGH (ref 11.5–14.5)
WBC: 12.5 10*3/uL — ABNORMAL HIGH (ref 3.6–11.0)

## 2017-09-15 LAB — PROTIME-INR
INR: 0.89
PROTHROMBIN TIME: 12 s (ref 11.4–15.2)

## 2017-09-15 LAB — LACTIC ACID, PLASMA
LACTIC ACID, VENOUS: 1.5 mmol/L (ref 0.5–1.9)
Lactic Acid, Venous: 1.2 mmol/L (ref 0.5–1.9)

## 2017-09-15 SURGERY — CYSTOSCOPY, WITH RETROGRADE PYELOGRAM AND URETERAL STENT INSERTION
Anesthesia: General | Laterality: Right

## 2017-09-15 MED ORDER — MORPHINE SULFATE (PF) 2 MG/ML IV SOLN
2.0000 mg | INTRAVENOUS | Status: DC | PRN
Start: 2017-09-15 — End: 2017-09-18

## 2017-09-15 MED ORDER — INSULIN ASPART 100 UNIT/ML ~~LOC~~ SOLN
0.0000 [IU] | SUBCUTANEOUS | Status: DC
  Administered 2017-09-16: 4 [IU] via SUBCUTANEOUS

## 2017-09-15 MED ORDER — FENTANYL CITRATE (PF) 100 MCG/2ML IJ SOLN: 25.0000 ug | INTRAMUSCULAR | Status: DC | PRN

## 2017-09-15 MED ORDER — ONDANSETRON HCL 4 MG/2ML IJ SOLN: 4.0000 mg | Freq: Once | INTRAMUSCULAR | Status: DC | PRN

## 2017-09-15 MED ORDER — ONDANSETRON HCL 4 MG/2ML IJ SOLN
INTRAMUSCULAR | Status: AC
  Filled 2017-09-15: qty 2

## 2017-09-15 MED ORDER — ACETAMINOPHEN 325 MG PO TABS: 650.0000 mg | ORAL_TABLET | Freq: Four times a day (QID) | ORAL | Status: DC | PRN

## 2017-09-15 MED ORDER — DEXAMETHASONE SODIUM PHOSPHATE 10 MG/ML IJ SOLN
INTRAMUSCULAR | Status: AC
  Filled 2017-09-15: qty 1

## 2017-09-15 MED ORDER — PHENYLEPHRINE HCL 10 MG/ML IJ SOLN
INTRAMUSCULAR | Status: DC | PRN
  Administered 2017-09-15: 100 ug via INTRAVENOUS

## 2017-09-15 MED ORDER — ONDANSETRON HCL 4 MG PO TABS: 4.0000 mg | ORAL_TABLET | Freq: Four times a day (QID) | ORAL | Status: DC | PRN

## 2017-09-15 MED ORDER — DEXAMETHASONE SODIUM PHOSPHATE 10 MG/ML IJ SOLN
INTRAMUSCULAR | Status: DC | PRN
  Administered 2017-09-15: 5 mg via INTRAVENOUS

## 2017-09-15 MED ORDER — ENOXAPARIN SODIUM 40 MG/0.4ML ~~LOC~~ SOLN
40.0000 mg | SUBCUTANEOUS | Status: DC
  Administered 2017-09-16: 40 mg via SUBCUTANEOUS
  Filled 2017-09-15: qty 0.4

## 2017-09-15 MED ORDER — SODIUM CHLORIDE 0.9 % IV BOLUS
500.0000 mL | Freq: Once | INTRAVENOUS | Status: AC
  Administered 2017-09-15: 500 mL via INTRAVENOUS

## 2017-09-15 MED ORDER — ONDANSETRON HCL 4 MG/2ML IJ SOLN
4.0000 mg | INTRAMUSCULAR | Status: AC
  Administered 2017-09-15: 4 mg via INTRAVENOUS
  Filled 2017-09-15: qty 2

## 2017-09-15 MED ORDER — FENTANYL CITRATE (PF) 100 MCG/2ML IJ SOLN
INTRAMUSCULAR | Status: AC
  Filled 2017-09-15: qty 2

## 2017-09-15 MED ORDER — PROPOFOL 10 MG/ML IV BOLUS
INTRAVENOUS | Status: AC
Start: 2017-09-15 — End: ?
  Filled 2017-09-15: qty 20

## 2017-09-15 MED ORDER — CEFTRIAXONE SODIUM 1 G IJ SOLR
1.0000 g | INTRAMUSCULAR | Status: AC
  Administered 2017-09-15: 1 g via INTRAVENOUS
  Filled 2017-09-15: qty 10

## 2017-09-15 MED ORDER — ONDANSETRON HCL 4 MG/2ML IJ SOLN
4.0000 mg | Freq: Four times a day (QID) | INTRAMUSCULAR | Status: DC | PRN
  Administered 2017-09-16 (×2): 4 mg via INTRAVENOUS
  Filled 2017-09-15 (×2): qty 2

## 2017-09-15 MED ORDER — POLYETHYLENE GLYCOL 3350 17 G PO PACK
17.0000 g | PACK | Freq: Every day | ORAL | Status: DC | PRN
  Administered 2017-09-17: 17 g via ORAL
  Filled 2017-09-15: qty 1

## 2017-09-15 MED ORDER — PROPOFOL 10 MG/ML IV BOLUS
INTRAVENOUS | Status: DC | PRN
  Administered 2017-09-15: 120 mg via INTRAVENOUS

## 2017-09-15 MED ORDER — ONDANSETRON HCL 4 MG/2ML IJ SOLN
INTRAMUSCULAR | Status: DC | PRN
  Administered 2017-09-15: 4 mg via INTRAVENOUS

## 2017-09-15 MED ORDER — ACETAMINOPHEN 650 MG RE SUPP: 650.0000 mg | Freq: Four times a day (QID) | RECTAL | Status: DC | PRN

## 2017-09-15 MED ORDER — LIDOCAINE HCL (PF) 2 % IJ SOLN
INTRAMUSCULAR | Status: AC
  Filled 2017-09-15: qty 10

## 2017-09-15 MED ORDER — LIDOCAINE HCL (PF) 2 % IJ SOLN
INTRAMUSCULAR | Status: DC | PRN
  Administered 2017-09-15: 50 mg

## 2017-09-15 MED ORDER — ALBUTEROL SULFATE (2.5 MG/3ML) 0.083% IN NEBU: 2.5000 mg | INHALATION_SOLUTION | RESPIRATORY_TRACT | Status: DC | PRN

## 2017-09-15 MED ORDER — EPHEDRINE SULFATE 50 MG/ML IJ SOLN
INTRAMUSCULAR | Status: DC | PRN
  Administered 2017-09-15: 10 mg via INTRAVENOUS

## 2017-09-15 MED ORDER — OXYCODONE HCL 5 MG PO TABS
5.0000 mg | ORAL_TABLET | ORAL | Status: DC | PRN
  Administered 2017-09-15 – 2017-09-18 (×6): 5 mg via ORAL
  Filled 2017-09-15 (×6): qty 1

## 2017-09-15 MED ORDER — SODIUM CHLORIDE 0.9 % IV SOLN: Freq: Once | INTRAVENOUS | Status: DC

## 2017-09-15 MED ORDER — MORPHINE SULFATE (PF) 2 MG/ML IV SOLN
2.0000 mg | Freq: Once | INTRAVENOUS | Status: AC
  Administered 2017-09-15: 2 mg via INTRAVENOUS
  Filled 2017-09-15: qty 1

## 2017-09-15 MED ORDER — SUCCINYLCHOLINE CHLORIDE 20 MG/ML IJ SOLN
INTRAMUSCULAR | Status: AC
  Filled 2017-09-15: qty 1

## 2017-09-15 MED ORDER — FENTANYL CITRATE (PF) 100 MCG/2ML IJ SOLN
INTRAMUSCULAR | Status: DC | PRN
  Administered 2017-09-15 (×2): 50 ug via INTRAVENOUS

## 2017-09-15 MED ORDER — ACETAMINOPHEN 500 MG PO TABS
1000.0000 mg | ORAL_TABLET | Freq: Once | ORAL | Status: AC
  Administered 2017-09-15: 1000 mg via ORAL

## 2017-09-15 MED ORDER — SODIUM CHLORIDE 0.9 % IV SOLN
INTRAVENOUS | Status: DC | PRN
  Administered 2017-09-15: 18:00:00 via INTRAVENOUS

## 2017-09-15 SURGICAL SUPPLY — 20 items
BAG DRAIN CYSTO-URO LG1000N (MISCELLANEOUS) ×3 IMPLANT
BAG URINE DRAINAGE (UROLOGICAL SUPPLIES) ×3 IMPLANT
CATH FOL 2WAY LX 16X5 (CATHETERS) ×3 IMPLANT
CATH URETL 5X70 OPEN END (CATHETERS) ×3 IMPLANT
CONRAY 43 FOR UROLOGY 50M (MISCELLANEOUS) ×3 IMPLANT
GLOVE BIO SURGEON STRL SZ7 (GLOVE) ×3 IMPLANT
GLOVE BIO SURGEON STRL SZ8 (GLOVE) ×9 IMPLANT
GOWN STRL REUS W/ TWL LRG LVL3 (GOWN DISPOSABLE) ×1 IMPLANT
GOWN STRL REUS W/TWL LRG LVL3 (GOWN DISPOSABLE) ×2
GUIDEWIRE STR ZIPWIRE 035X150 (MISCELLANEOUS) ×3 IMPLANT
KIT TURNOVER CYSTO (KITS) ×3 IMPLANT
PACK CYSTO AR (MISCELLANEOUS) ×3 IMPLANT
SET CYSTO W/LG BORE CLAMP LF (SET/KITS/TRAYS/PACK) ×3 IMPLANT
SOL .9 NS 3000ML IRR  AL (IV SOLUTION) ×2
SOL .9 NS 3000ML IRR UROMATIC (IV SOLUTION) ×1 IMPLANT
STENT URET 6FRX24 CONTOUR (STENTS) ×3 IMPLANT
STENT URET 6FRX26 CONTOUR (STENTS) ×3 IMPLANT
SURGILUBE 2OZ TUBE FLIPTOP (MISCELLANEOUS) ×3 IMPLANT
SYRINGE IRR TOOMEY STRL 70CC (SYRINGE) ×3 IMPLANT
WATER STERILE IRR 1000ML POUR (IV SOLUTION) ×3 IMPLANT

## 2017-09-15 NOTE — Consult Note (Signed)
Urology Consult  Referring physician: Dr. Darvin Neighbours Reason for referral: right ureteral stone, sepsis  Chief Complaint: Right flank pain  History of Present Illness: Karen [name] is a 72yo with a hx of CHF, CKD, COPD on home O2, DMII, and nephrolithiasis who presented to the [facility] Urgent care today with a 1 day hx of severe, sharp, constant, nonradiating right flank pain. She has had worsening difficulty controlled her blood sugar over the past 2 days. She has a known 21m calculus and on CT today it was found to have migrated to the proximal ureter. UA was concerning for infection. WBC count is 12.5. No other associated symptoms. No exacerbating/alleviating events  Past Medical History:  Diagnosis Date  . Acute on chronic respiratory failure with hypoxia and hypercapnia ([place] 01/07/2015  . Anemia   . 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 Cataract   . CHF (congestive heart failure) ([place]   . Chronic kidney disease 08/10/2017  . CKD (chronic kidney disease)   . COPD (chronic obstructive pulmonary disease) ([place]   . Diabetes mellitus without complication ([place]   . Edema, peripheral 04/20/2014  . Hypertension   . Iron deficiency anemia 06/22/2014  . Leucocytosis 10/19/2015  . Primary osteoarthritis of right knee 09/01/2016  . Renal insufficiency   . Sciatica 01/07/2015   Past Surgical History:  Procedure Laterality Date  . CESAREAN SECTION    . CHOLECYSTECTOMY    . COLONOSCOPY WITH PROPOFOL N/A 08/28/2017   Procedure: COLONOSCOPY WITH PROPOFOL;  Surgeon: WLucilla Lame MD;  [facility]  Service: Endoscopy;  Laterality: N/A;  . COLONOSCOPY WITH PROPOFOL N/A 08/29/2017   Procedure: COLONOSCOPY WITH PROPOFOL;  Surgeon: WLucilla Lame MD;  [facility];  Service: Endoscopy;  Laterality: N/A;  . EYE SURGERY      Medications: I have reviewed the patient's current medications. Allergies:  Allergies  Allergen Reactions  . Ace Inhibitors   . Gabapentin Hives  . Lisinopril   . Lyrica  [Pregabalin]     Family History  Family history unknown: Yes   Social History:  reports that she has quit smoking. She has never used smokeless tobacco. She reports that she does not drink alcohol or use drugs.  Review of Systems  Genitourinary: Positive for flank pain.  All other systems reviewed and are negative.   Physical Exam:  Vital signs in last 24 hours: Temp:  [98.2 F (36.8 C)-98.3 F (36.8 C)] 98.3 F (36.8 C) (07/13 1355) Pulse Rate:  [81-97] 81 (07/13 1641) Resp:  [16-20] 20 (07/13 1641) BP: (124-154)/(45-55) 154/55 (07/13 1641) SpO2:  [91 %-100 %] 100 % (07/13 1641) Weight:  [101.2 kg (223 lb)-103.4 kg (228 lb)] 101.2 kg (223 lb) (07/13 1356) Physical Exam  Constitutional: She is oriented to person, place, and time. She appears well-developed and well-nourished.  HENT:  Head: Normocephalic and atraumatic.  Eyes: Pupils are equal, round, and reactive to light. EOM are normal.  Neck: Normal range of motion. No thyromegaly present.  Cardiovascular: Normal rate and regular rhythm.  Respiratory: Effort normal. No respiratory distress.  GI: Soft. She exhibits no distension.  Musculoskeletal: Normal range of motion. She exhibits no edema.  Neurological: She is alert and oriented to person, place, and time.  Skin: Skin is warm and dry.  Psychiatric: She has a normal mood and affect. Her behavior is normal. Judgment and thought content normal.    Laboratory Data:  Results for orders placed or performed during the hospital encounter of 09/15/17 (from the past 72 hour(s))  Basic metabolic panel     Status: Abnormal   Collection Time: 09/15/17  2:01 PM  Result Value Ref Range   Sodium 140 135 - 145 mmol/L   Potassium 4.3 3.5 - 5.1 mmol/L    Comment: HEMOLYSIS AT THIS LEVEL MAY AFFECT RESULT   Chloride 94 (L) 98 - 111 mmol/L    Comment: Please note change in reference range.   CO2 34 (H) 22 - 32 mmol/L   Glucose, Bld 229 (H) 70 - 99 mg/dL    Comment: Please note  change in reference range.   BUN 19 8 - 23 mg/dL    Comment: Please note change in reference range.   Creatinine, Ser 1.50 (H) 0.44 - 1.00 mg/dL   Calcium 10.0 8.9 - 10.3 mg/dL   GFR calc non Af Amer 33 (L) >60 mL/min   GFR calc Af Amer 39 (L) >60 mL/min    Comment: (NOTE) The eGFR has been calculated using the CKD EPI equation. This calculation has not been validated in all clinical situations. eGFR's persistently <60 mL/min signify possible Chronic Kidney Disease.    Anion gap 12 5 - 15    Comment: Performed at [facility], [address] [place], [place] [address]  CBC     Status: Abnormal   Collection Time: 09/15/17  2:01 PM  Result Value Ref Range   WBC 12.5 (H) 3.6 - 11.0 K/uL   RBC 3.00 (L) 3.80 - 5.20 MIL/uL   Hemoglobin 9.2 (L) 12.0 - 16.0 g/dL   HCT 27.6 (L) 35.0 - 47.0 %   MCV 92.1 80.0 - 100.0 fL   MCH 30.7 26.0 - 34.0 pg   MCHC 33.4 32.0 - 36.0 g/dL   RDW 15.8 (H) 11.5 - 14.5 %   Platelets 271 150 - 440 K/uL    Comment: Performed at [facility], [address] [place], [place] [address]  Protime-INR     Status: None   Collection Time: 09/15/17  5:46 PM  Result Value Ref Range   Prothrombin Time 12.0 11.4 - 15.2 seconds   INR 0.89     Comment: Performed at [facility], [address] [place], [place] [address]  APTT     Status: None   Collection Time: 09/15/17  5:46 PM  Result Value Ref Range   aPTT 26 24 - 36 seconds    Comment: Performed at [facility], [address] [place], [place] [address]  Lactic acid, plasma     Status: None   Collection Time: 09/15/17  5:46 PM  Result Value Ref Range   Lactic Acid, Venous 1.5 0.5 - 1.9 mmol/L    Comment: Performed at [facility], [address] [place], [place] [address]   No results found for this or any previous visit (from the past 240 hour(s)). Creatinine: Recent Labs    09/11/17 1626 09/15/17 1401  CREATININE 0.89 1.50*   Baseline Creatinine:  0.9  Impression/Assessment:  71yo with a right ureteral calculus and sepsis  Plan:  The risks/benefits/alternatives to right ureteral stent placement was explained to the patient and husband and they understand and wish to proceed with surgery. She will be taken urgently to the OR for right ureteral stent placement.   Karen [name] 09/15/2017, 6:16 PM

## 2017-09-15 NOTE — ED Provider Notes (Signed)
[facility] Emergency Department Provider Note  ____________________________________________   First MD Initiated Contact with Patient 09/15/17 1636     (approximate)  I have reviewed the triage vital signs and the nursing notes.   HISTORY  Chief Complaint Flank Pain    HPI Karen [name] is a 72 y.o. female with a complicated medical history who presents from the [facility] urgent care for further evaluation of a right sided ureteral obstruction with hydronephrosis and a UTI.  Patient reports acute onset non-radiating sharp and stabbing right flank pain about 24 hours ago which has waxed and waned but not gone away.  She recently was in the hospital for a colonoscopy for lower GI bleeding and found out at that time that she had a 9-mm calculus in the right kidney.  Unfortunately that seems to have moved, because at the urgent care she had a CT renal protocol that demonstrated an obstructive 9-mm stone in the right ureter causing hydronephrosis and perinephric stranding.  Dr. Alphonzo Cruise in the [facility] called Dr. Alyson Ingles (urology) and he suggested the patient come to the [facility] ED for further evaluation and urolog consultation.  Patient reports persistent moderate to severe pain.  Hungry, denies N/V.  Denies CP and SOB, but uses 3L O2 [place] at baseline.  Denies fever/chills, dysuria, and gross hematuria.   Past Medical History:  Diagnosis Date  . Acute on chronic respiratory failure with hypoxia and hypercapnia ([place]) 01/07/2015  . Anemia   . 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 Cataract   . CHF (congestive heart failure) ([place])   . Chronic kidney disease 08/10/2017  . CKD (chronic kidney disease)   . COPD (chronic obstructive pulmonary disease) ([place])   . Diabetes mellitus without complication ([place])   . Edema, peripheral 04/20/2014  . Hypertension   . Iron deficiency anemia 06/22/2014  . Leucocytosis 10/19/2015  . Primary osteoarthritis of right knee 09/01/2016  . Renal  insufficiency   . Sciatica 01/07/2015    Patient Active Problem List   Diagnosis Date Noted  . Right ureteral stone 09/15/2017  . Acute GI bleeding   . GI bleed 08/26/2017  . Arthritis 08/10/2017  . Chronic kidney disease 08/10/2017  . COPD (chronic obstructive pulmonary disease) ([place]) 08/10/2017  . Diabetes mellitus type 2, uncomplicated ([place]) 61/95/0932  . Hypertension 08/10/2017  . Obesity (BMI 35.0-39.9 without comorbidity) 04/11/2017  . Primary osteoarthritis of right knee 09/01/2016  . Leucocytosis 10/19/2015  . 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 Acute on chronic respiratory failure with hypoxia and hypercapnia ([place]) 01/07/2015  . Sciatica 01/07/2015  . Weakness 01/07/2015  . 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 Iron deficiency anemia 06/22/2014  . Microalbuminuria 06/22/2014  . CHF (congestive heart failure) ([place]) 04/20/2014  . Edema, peripheral 04/20/2014    Past Surgical History:  Procedure Laterality Date  . CESAREAN SECTION    . CHOLECYSTECTOMY    . COLONOSCOPY WITH PROPOFOL N/A 08/28/2017   Procedure: COLONOSCOPY WITH PROPOFOL;  Surgeon: Lucilla Lame, MD;  Location[facility] END[facility]COPY;  Service: Endoscopy;  Laterality: N/A;  . COLONOSCOPY WITH PROPOFOL N/A 08/29/2017   Procedure: COLONOSCOPY WITH PROPOFOL;  Surgeon: Lucilla Lame, MD;  Location: [facility] ENDOSCOPY;  Service: Endoscopy;  Laterality: N/A;  . EYE SURGERY      Prior to Admission medications   Medication Sig Start Date End Date Taking? Authorizing Provider  atorvastatin (LIPITOR) 10 MG tablet Take 1 tablet by mouth daily.    [provider]  canagliflozin [facility]) 100  MG TABS tablet Take 1 tablet by mouth daily.    [provider]  conjugated estrogens (PREMARIN) vaginal cream Apply 0.22m (pea-sized amount)  just inside the vaginal introitus with a finger-tip on  Monday, Wednesday and Friday nights. 08/31/17   MZara CouncilA, PA-C  estradiol (ESTRACE VAGINAL) 0.1 MG/GM  vaginal cream Apply 0.577m(pea-sized amount)  just inside the vaginal introitus with a finger-tip on Monday, Wednesday and Friday nights. 08/31/17   McZara Council, PA-C  fluticasone furoate-vilanterol (BREO ELLIPTA) 100-25 MCG/INH AEPB Inhale 1 puff into the lungs daily.    [provider]  HUMALOG 100 UNIT/ML injection Inject 76 Units into the skin daily. In VGO device 08/19/17   [provider]  hydrochlorothiazide (HYDRODIURIL) 25 MG tablet Take 25 mg by mouth daily. 08/10/17   [provider]  Insulin Disposable Pump (V-GO 40) KIT Inject into the skin as directed. 08/14/17   [provider]  metFORMIN (GLUCOPHAGE) 500 MG tablet Take 1,000 mg by mouth 2 (two) times daily with a meal.     [provider]  mirabegron ER (MYRBETRIQ) 25 MG TB24 tablet Take 1 tablet (25 mg total) by mouth daily. 08/31/17   McZara Council, PA-C  montelukast (SINGULAIR) 10 MG tablet Take 10 mg by mouth at bedtime.    [provider]  OXYGEN Place 3 L/min into the nose.     [provider]    Allergies Ace inhibitors; Gabapentin; Lisinopril; and Lyrica [pregabalin]  Family History  Family history unknown: Yes    Social History Social History   Tobacco Use  . Smoking status: Former SmResearch scientist (life sciences). Smokeless tobacco: Never Used  Substance Use Topics  . Alcohol use: No  . Drug use: No    Review of Systems Constitutional: No fever/chills Eyes: No visual changes. ENT: No sore throat. Cardiovascular: Denies chest pain. Respiratory: Denies shortness of breath. Gastrointestinal: Non-radiating right-sided sharp flank pain.  No nausea, no vomiting.  No diarrhea.  No constipation. Genitourinary: Negative for dysuria nor hematuria. Musculoskeletal: Negative for neck pain.  Negative for back pain except flank pain as described above. Integumentary: Negative for rash. Neurological: Negative for headaches, focal weakness or  numbness.   ____________________________________________   PHYSICAL EXAM:  VITAL SIGNS: ED Triage Vitals  Enc Vitals Group     BP 09/15/17 1355 (!) 128/45     Pulse Rate 09/15/17 1355 97     Resp 09/15/17 1355 18     Temp 09/15/17 1355 98.3 F (36.8 C)     Temp Source 09/15/17 1355 Oral     SpO2 09/15/17 1355 91 %     Weight 09/15/17 1356 101.2 kg (223 lb)     Height 09/15/17 1356 1.6 m (_0 )     Head Circumference --      Peak Flow --      Pain Score 09/15/17 1356 10     Pain Loc --      Pain Edu? --      Excl. in [place]--     Constitutional: Alert and oriented. Well appearing and in no acute distress. Eyes: Conjunctivae are normal.  Head: Atraumatic. Nose: No congestion/rhinnorhea. Mouth/Throat: Mucous membranes are moist. Neck: No stridor.  No meningeal signs.   Cardiovascular: Normal rate, regular rhythm. Good peripheral circulation. Grossly normal heart sounds. Respiratory: Normal respiratory effort.  No retractions. Lungs CTAB.  Using 3L O2 as per baseline. Gastrointestinal: Soft and nontender. No distention.  Musculoskeletal: No lower extremity  tenderness nor edema. No gross deformities of extremities. Neurologic:  Normal speech and language. No gross focal neurologic deficits are appreciated.  Skin:  Skin is warm, dry and intact. No rash noted. Psychiatric: Mood and affect are normal. Speech and behavior are normal.  ____________________________________________   LABS (all labs ordered are listed, but only abnormal results are displayed)  Labs Reviewed  BASIC METABOLIC PANEL - Abnormal; Notable for the following components:      Result Value   Chloride 94 (*)    CO2 34 (*)    Glucose, Bld 229 (*)    Creatinine, Ser 1.50 (*)    GFR calc non Af Amer 33 (*)    GFR calc Af Amer 39 (*)    All other components within normal limits  CBC - Abnormal; Notable for the following components:   WBC 12.5 (*)    RBC 3.00 (*)    Hemoglobin 9.2 (*)    HCT 27.6 (*)     RDW 15.8 (*)    All other components within normal limits  URINE CULTURE  PROTIME-INR  APTT  LACTIC ACID, PLASMA  LACTIC ACID, PLASMA  HEMOGLOBIN T7S  BASIC METABOLIC PANEL  CBC   ____________________________________________  EKG  ED ECG REPORT I, Hinda Kehr, the attending physician, personally viewed and interpreted this ECG.  Date: 09/15/2017 EKG Time: 17: 35 Rate: 77 Rhythm: normal sinus rhythm QRS Axis: normal Intervals: normal ST/T Wave abnormalities: normal Narrative Interpretation: no evidence of acute ischemia  ____________________________________________  RADIOLOGY   ED MD interpretation: 9 mm obstructing stone at the right UPJ with hydronephrosis and perinephric stranding.  Official radiology report(s): Ct Renal Stone Study  Result Date: 09/15/2017 CLINICAL DATA:  RIGHT-sided abdominal pain. EXAM: CT ABDOMEN AND PELVIS WITHOUT CONTRAST TECHNIQUE: Multidetector CT imaging of the abdomen and pelvis was performed following the standard protocol without IV contrast. COMPARISON:  CT of the abdomen and pelvis on 08/27/2017 the FINDINGS: Lower chest: There is mitral calcification. Heart size appears normal. Lung bases are unremarkable. Hepatobiliary: Status post cholecystectomy.  No liver lesions. Pancreas: Unremarkable. No pancreatic ductal dilatation or surrounding inflammatory changes. Spleen: Normal in size without focal abnormality. Adrenals/Urinary Tract: Renal glands are normal. There is moderate RIGHT hydronephrosis secondary to a calculus at the ureteropelvic junction which measures 9 millimeters in diameter. There is RIGHT perinephric stranding. The distal RIGHT ureter is unremarkable. The LEFT kidney and ureter are unremarkable. Urinary bladder is normal in appearance. Stomach/Bowel: The stomach and small bowel loops are normal in appearance. There are numerous colonic diverticula but no associated inflammation. Appendix is not seen. Vascular/Lymphatic: There  is atherosclerotic calcification of the abdominal aorta not associated with aneurysm. No abdominal adenopathy. There is subtle stranding in the mesenteric fat not associated significant adenopathy. Reproductive: Uterus is present.  No adnexal mass. Other: No free pelvic fluid. There is anterior abdominal wall laxity. No hernia. Musculoskeletal: There is grade 1 anterolisthesis of L4 on L5, measuring 7 millimeters. Degenerative changes are seen in the LOWER thoracic and lumbar spine. IMPRESSION: 1. 9 millimeter obstructing stone at the RIGHT ureteropelvic junction with associated hydronephrosis and perinephric stranding. 2. Mitral calcifications. 3. Cholecystectomy. 4. Significant colonic diverticulosis. 5.  Aortic atherosclerosis.  (ICD10-I70.0) 6. Spondylosis.  Grade 1 anterolisthesis of L4 on L5. Electronically Signed   By: Nolon Nations M.D.   On: 09/15/2017 11:54    ____________________________________________   PROCEDURES  Critical Care performed: No   Procedure(s) performed:   Procedures   ____________________________________________   INITIAL  IMPRESSION / ASSESSMENT AND PLAN / ED COURSE  As part of my medical decision making, I reviewed the following data within the electronic MEDICAL RECORD NUMBER Notes from prior ED visits and [place] Controlled Substance Database    Differential diagnosis includes but is not limited to obstructing ureteral stone with UTI, pyelonephritis, renal infarction.  Patient's work-up is strongly demonstrative of an impacted and mildly infected stone, a calculus of 9 mm in diameter which is unlikely to pass on its own.  She has some perinephric stranding and hydronephrosis and a leukocytosis of 12.5.  Fortunately she does not meet sepsis criteria and she is generally well-appearing and in no acute distress with a normal blood pressure.  Her basic metabolic panel is slightly concerning because of a creatinine of 1.50 baseline seems to be between 0.5 and 0.8,  indicative of some acute renal insufficiency if not acute renal failure.  I spoke by phone with Dr. Alyson Ingles with the urology service and he was already aware of the patient from his prior conversation with Dr. Vanita Panda.  He has posted her for the OR and he will place a ureteral stent as soon as the OR is open and available.  I then discussed the case with Dr. Darvin Neighbours with the hospitalist service who will admit her given her extensive comorbidities and the need for additional IV antibiotics and post procedural observation.  I have ordered ceftriaxone 1 g IV, normal saline 500 mL normal saline bolus, and I also ordered a urine culture with a new specimen since a culture was not available from her urgent care visit.  Lactic acid is in process.  I also ordered coagulation studies given her recent GI bleeding episodes.  She is stable for the operating room for ureteral stent.     ____________________________________________  FINAL CLINICAL IMPRESSION(S) / ED DIAGNOSES  Final diagnoses:  Urinary tract infection without hematuria, site unspecified  Ureteral obstruction, right  Ureteral stone with hydronephrosis     MEDICATIONS GIVEN DURING THIS VISIT:  Medications  sodium chloride 0.9 % bolus 500 mL (500 mLs Intravenous New Bag/Given 09/15/17 1723)  enoxaparin (LOVENOX) injection 40 mg (has no administration in time range)  insulin aspart (novoLOG) injection 0-15 Units (has no administration in time range)  acetaminophen (TYLENOL) tablet 650 mg (has no administration in time range)    Or  acetaminophen (TYLENOL) suppository 650 mg (has no administration in time range)  oxyCODONE (Oxy IR/ROXICODONE) immediate release tablet 5 mg (has no administration in time range)  polyethylene glycol (MIRALAX / GLYCOLAX) packet 17 g (has no administration in time range)  ondansetron (ZOFRAN) tablet 4 mg (has no administration in time range)    Or  ondansetron (ZOFRAN) injection 4 mg (has no administration in  time range)  albuterol (PROVENTIL) (2.5 MG/3ML) 0.083% nebulizer solution 2.5 mg (has no administration in time range)  morphine 2 MG/ML injection 2 mg (has no administration in time range)  cefTRIAXone (ROCEPHIN) 1 g in sodium chloride 0.9 % 100 mL IVPB (1 g Intravenous New Bag/Given 09/15/17 1723)  morphine 2 MG/ML injection 2 mg (2 mg Intravenous Given 09/15/17 1750)  ondansetron (ZOFRAN) injection 4 mg (4 mg Intravenous Given 09/15/17 1745)     ED Discharge Orders    None       Note:  This document was prepared using Dragon voice recognition software and may include unintentional dictation errors.    Hinda Kehr, MD 09/15/17 [phone]

## 2017-09-15 NOTE — ED Triage Notes (Signed)
Per patient seen her renal doctor x 1 week ago and was told she had kidney stone. Patient stated was told by her kidney doctor that after she seen her GI doctor she need to follow up back with them. Per patient appointment with GI on Monday 09/17/17. Pt. Stated with right side pain.

## 2017-09-15 NOTE — Anesthesia Postprocedure Evaluation (Signed)
Anesthesia Post Note  Patient: Karen [name]  Procedure(s) Performed: CYSTOSCOPY WITH RETROGRADE PYELOGRAM/URETERAL STENT PLACEMENT (Right )  Patient location during evaluation: PACU Anesthesia Type: General Level of consciousness: awake and alert Pain management: pain level controlled Vital Signs Assessment: post-procedure vital signs reviewed and stable Respiratory status: spontaneous breathing and respiratory function stable Cardiovascular status: stable Anesthetic complications: no     Last Vitals:  Vitals:   09/15/17 1909 09/15/17 1924  BP: 139/63 122/63  Pulse: 80 81  Resp: 17 18  Temp: (!) 36.4 C   SpO2: 99% 100%    Last Pain:  Vitals:   09/15/17 1924  TempSrc:   PainSc: 0-No pain                 KEPHART,WILLIAM K

## 2017-09-15 NOTE — H&P (Signed)
[facility] NAME: Karen [name]    MR#:  [id number]  DATE OF BIRTH:  May 07, 1945  DATE OF ADMISSION:  09/15/2017  PRIMARY CARE PHYSICIAN: Ricardo Jericho, NP   REQUESTING/REFERRING PHYSICIAN: Dr. Karma Greaser  CHIEF COMPLAINT:   Chief Complaint  Patient presents with  . Flank Pain    HISTORY OF PRESENT ILLNESS:  Karen [name]  is a 72 y.o. female with a known history of COPD, chronic respiratory failure, chronic diastolic CHF, diabetes, hypertension presents to the emergency room sent in from urgent care due to 9 mm right ureteropelvic junction stone with associated hydronephrosis and UTI.  Case discussed with urology.  Patient needs urgent procedure.  Patient feels significant pain and right flank pain that started yesterday.  Chills.  No fever.  Has elevated WBC.  No change in antibiotics.  No prior history of nephrolithiasis.   CT abd showed 1. 9 millimeter obstructing stone at the RIGHT ureteropelvic junction with associated hydronephrosis and perinephric stranding.  PAST MEDICAL HISTORY:   Past Medical History:  Diagnosis Date  . Acute on chronic respiratory failure with hypoxia and hypercapnia ([place]) 01/07/2015  . Anemia   . 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 Cataract   . CHF (congestive heart failure) ([place])   . Chronic kidney disease 08/10/2017  . CKD (chronic kidney disease)   . COPD (chronic obstructive pulmonary disease) ([place])   . Diabetes mellitus without complication ([place])   . Edema, peripheral 04/20/2014  . Hypertension   . Iron deficiency anemia 06/22/2014  . Leucocytosis 10/19/2015  . Primary osteoarthritis of right knee 09/01/2016  . Renal insufficiency   . Sciatica 01/07/2015    PAST SURGICAL HISTORY:   Past Surgical History:  Procedure Laterality Date  . CESAREAN SECTION    . CHOLECYSTECTOMY    . COLONOSCOPY WITH PROPOFOL N/A 08/28/2017   Procedure: COLONOSCOPY WITH PROPOFOL;  Surgeon: Lucilla Lame, MD;   Location: [facility] ENDOSCOPY;  Service: Endoscopy;  Laterality: N/A;  . COLONOSCOPY WITH PROPOFOL N/A 08/29/2017   Procedure: COLONOSCOPY WITH PROPOFOL;  Surgeon: Lucilla Lame, MD;  Location: [facility] ENDOSCOPY;  Service: Endoscopy;  Laterality: N/A;  . EYE SURGERY      SOCIAL HISTORY:   Social History   Tobacco Use  . Smoking status: Former Research scientist (life sciences)  . Smokeless tobacco: Never Used  Substance Use Topics  . Alcohol use: No    FAMILY HISTORY:   Family History  Family history unknown: Yes    DRUG ALLERGIES:   Allergies  Allergen Reactions  . Ace Inhibitors   . Gabapentin Hives  . Lisinopril   . Lyrica [Pregabalin]     REVIEW OF SYSTEMS:   Review of Systems  Constitutional: Positive for chills and malaise/fatigue. Negative for fever and weight loss.  HENT: Negative for hearing loss and nosebleeds.   Eyes: Negative for blurred vision, double vision and pain.  Respiratory: Negative for cough, hemoptysis, sputum production, shortness of breath and wheezing.   Cardiovascular: Negative for chest pain, palpitations, orthopnea and leg swelling.  Gastrointestinal: Positive for abdominal pain and nausea. Negative for constipation, diarrhea and vomiting.  Genitourinary: Negative for dysuria and hematuria.  Musculoskeletal: Negative for back pain, falls and myalgias.  Skin: Negative for rash.  Neurological: Negative for dizziness, tremors, sensory change, speech change, focal weakness, seizures and headaches.  Endo/Heme/Allergies: Does not bruise/bleed easily.  Psychiatric/Behavioral: Negative for depression and memory loss. The patient is not nervous/anxious.     MEDICATIONS AT HOME:  Prior to Admission medications   Medication Sig Start Date End Date Taking? Authorizing Provider  atorvastatin (LIPITOR) 10 MG tablet Take 1 tablet by mouth daily.    [provider]  canagliflozin (INVOKANA) 100 MG TABS tablet Take 1 tablet by mouth daily.    [provider]   conjugated estrogens (PREMARIN) vaginal cream Apply 0.30m (pea-sized amount)  just inside the vaginal introitus with a finger-tip on  Monday, Wednesday and Friday nights. 08/31/17   MZara CouncilA, PA-C  estradiol (ESTRACE VAGINAL) 0.1 MG/GM vaginal cream Apply 0.530m(pea-sized amount)  just inside the vaginal introitus with a finger-tip on Monday, Wednesday and Friday nights. 08/31/17   McZara Council, PA-C  fluticasone furoate-vilanterol (BREO ELLIPTA) 100-25 MCG/INH AEPB Inhale 1 puff into the lungs daily.    [provider]  HUMALOG 100 UNIT/ML injection Inject 76 Units into the skin daily. In VGO device 08/19/17   [provider]  hydrochlorothiazide (HYDRODIURIL) 25 MG tablet Take 25 mg by mouth daily. 08/10/17   [provider]  Insulin Disposable Pump (V-GO 40) KIT Inject into the skin as directed. 08/14/17   [provider]  metFORMIN (GLUCOPHAGE) 500 MG tablet Take 1,000 mg by mouth 2 (two) times daily with a meal.     [provider]  mirabegron ER (MYRBETRIQ) 25 MG TB24 tablet Take 1 tablet (25 mg total) by mouth daily. 08/31/17   McZara Council, PA-C  montelukast (SINGULAIR) 10 MG tablet Take 10 mg by mouth at bedtime.    [provider]  OXYGEN Place 3 L/min into the nose.     [provider]     VITAL SIGNS:  Blood pressure (!) 154/55, pulse 81, temperature 98.3 F (36.8 C), temperature source Oral, resp. rate 20, height '5\' 3"'  (1.6 m), weight 101.2 kg (223 lb), SpO2 100 %.  PHYSICAL EXAMINATION:  Physical Exam  GENERAL:  [age].o.-year-old patient lying in the bed with no acute distress.  Obese EYES: Pupils equal, round, reactive to light and accommodation. No scleral icterus. Extraocular muscles intact.  HEENT: Head atraumatic, normocephalic. Oropharynx and nasopharynx clear. No oropharyngeal erythema, moist oral mucosa  NECK:  Supple, no jugular venous distention. No thyroid enlargement, no tenderness.  LUNGS:  Normal breath sounds bilaterally, no wheezing, rales, rhonchi. No use of accessory muscles of respiration.  CARDIOVASCULAR: S1, S2 normal. No murmurs, rubs, or gallops.  ABDOMEN: Soft, right flank tenderness, nondistended. Bowel sounds present. No organomegaly or mass.  EXTREMITIES: No pedal edema, cyanosis, or clubbing. + 2 pedal & radial pulses b/l.   NEUROLOGIC: Cranial nerves II through XII are intact. No focal Motor or sensory deficits appreciated b/l PSYCHIATRIC: The patient is alert and oriented x 3. Good affect.  SKIN: No obvious rash, lesion, or ulcer.   LABORATORY PANEL:   CBC Recent Labs  Lab 09/15/17 1401  WBC 12.5*  HGB 9.2*  HCT 27.6*  PLT 271   ------------------------------------------------------------------------------------------------------------------  Chemistries  Recent Labs  Lab 09/11/17 1626 09/15/17 1401  NA 137 140  K 4.2 4.3  CL 100 94*  CO2 29 34*  GLUCOSE 206* 229*  BUN 18 19  CREATININE 0.89 1.50*  CALCIUM 8.9 10.0  AST 17  --   ALT 14  --   ALKPHOS 52  --   BILITOT 0.7  --    ------------------------------------------------------------------------------------------------------------------  Cardiac Enzymes No results for input(s): TROPONINI in the last 168 hours. ------------------------------------------------------------------------------------------------------------------  RADIOLOGY:  Ct Renal Stone Study  Result Date:  09/15/2017 CLINICAL DATA:  RIGHT-sided abdominal pain. EXAM: CT ABDOMEN AND PELVIS WITHOUT CONTRAST TECHNIQUE: Multidetector CT imaging of the abdomen and pelvis was performed following the standard protocol without IV contrast. COMPARISON:  CT of the abdomen and pelvis on 08/27/2017 the FINDINGS: Lower chest: There is mitral calcification. Heart size appears normal. Lung bases are unremarkable. Hepatobiliary: Status post cholecystectomy.  No liver lesions. Pancreas: Unremarkable. No pancreatic ductal dilatation or  surrounding inflammatory changes. Spleen: Normal in size without focal abnormality. Adrenals/Urinary Tract: Renal glands are normal. There is moderate RIGHT hydronephrosis secondary to a calculus at the ureteropelvic junction which measures 9 millimeters in diameter. There is RIGHT perinephric stranding. The distal RIGHT ureter is unremarkable. The LEFT kidney and ureter are unremarkable. Urinary bladder is normal in appearance. Stomach/Bowel: The stomach and small bowel loops are normal in appearance. There are numerous colonic diverticula but no associated inflammation. Appendix is not seen. Vascular/Lymphatic: There is atherosclerotic calcification of the abdominal aorta not associated with aneurysm. No abdominal adenopathy. There is subtle stranding in the mesenteric fat not associated significant adenopathy. Reproductive: Uterus is present.  No adnexal mass. Other: No free pelvic fluid. There is anterior abdominal wall laxity. No hernia. Musculoskeletal: There is grade 1 anterolisthesis of L4 on L5, measuring 7 millimeters. Degenerative changes are seen in the LOWER thoracic and lumbar spine. IMPRESSION: 1. 9 millimeter obstructing stone at the RIGHT ureteropelvic junction with associated hydronephrosis and perinephric stranding. 2. Mitral calcifications. 3. Cholecystectomy. 4. Significant colonic diverticulosis. 5.  Aortic atherosclerosis.  (ICD10-I70.0) 6. Spondylosis.  Grade 1 anterolisthesis of L4 on L5. Electronically Signed   By: Nolon Nations M.D.   On: 09/15/2017 11:54     IMPRESSION AND PLAN:   *9 mm right ureteropelvic junction stone with associated hydronephrosis and UTI Will start patient on IV antibiotics.  Urology consulted.  Operating room soon.  Urine cultures.  IV fluids.  *Acute kidney injury due to ureteral obstruction and UTI.  IV fluids.  Should improve once the obstruction resolves.  *Diabetes mellitus.  Patient will be on every 4 Accu-Cheks.  N.p.o.  Sliding scale  insulin.  *COPD with chronic respiratory failure.  Continue oxygen.  Nebulizers.  Home inhalers.  *Chronic diastolic congestive heart failure with no signs of fluid overload.  Monitor while being on IV fluids.  DVT prophylaxis with Lovenox  All the records are reviewed and case discussed with ED provider. Management plans discussed with the patient, family and they are in agreement.  CODE STATUS: DO NOT RESUSCITATE  TOTAL TIME TAKING CARE OF THIS PATIENT: 35 minutes.   Neita Carp M.D on 09/15/2017 at 5:47 PM  Between 7am to 6pm - Pager - [phone]  After 6pm go to [email or web address] - password [email or web address] [facility]  Office  [phone]  CC: Primary care physician; White, Orlene Och, NP  Note: This dictation was prepared with Dragon dictation along with smaller phrase technology. Any transcriptional errors that result from this process are unintentional.

## 2017-09-15 NOTE — Anesthesia Preprocedure Evaluation (Signed)
Anesthesia Evaluation  Patient identified by MRN, date of birth, ID band Patient awake    Reviewed: Allergy & Precautions, NPO status , Patient's Chart, lab work & pertinent test results  History of Anesthesia Complications Negative for: history of anesthetic complications  Airway Mallampati: III       Dental   Pulmonary neg sleep apnea, COPD,  COPD inhaler and oxygen dependent, former smoker,           Cardiovascular hypertension, Pt. on medications +CHF (diastolic dysfunction)  (-) CAD and (-) Past MI (-) dysrhythmias (-) Valvular Problems/Murmurs     Neuro/Psych neg Seizures    GI/Hepatic Neg liver ROS, neg GERD  ,  Endo/Other  diabetes, Type 2, Insulin Dependent, Oral Hypoglycemic Agents  Renal/GU Renal InsufficiencyRenal disease (stones)     Musculoskeletal   Abdominal   Peds  Hematology  (+) anemia ,   Anesthesia Other Findings   Reproductive/Obstetrics                             Anesthesia Physical Anesthesia Plan  ASA: III and emergent  Anesthesia Plan: General   Post-op Pain Management:    Induction: Intravenous  PONV Risk Score and Plan: 3 and Dexamethasone, Ondansetron and Midazolam  Airway Management Planned: LMA  Additional Equipment:   Intra-op Plan:   Post-operative Plan:   Informed Consent: I have reviewed the patients History and Physical, chart, labs and discussed the procedure including the risks, benefits and alternatives for the proposed anesthesia with the patient or authorized representative who has indicated his/her understanding and acceptance.     Plan Discussed with:   Anesthesia Plan Comments:         Anesthesia Quick Evaluation

## 2017-09-15 NOTE — ED Provider Notes (Signed)
HPI  SUBJECTIVE:  Karen [name] is a 72 y.o. female who presents with intermittent, hours long, sharp right back and flank pain starting last night.  She states it does not travel into her groin.  She tried antacids with improvement of symptoms.  She also states her symptoms are better with belching.  Husband notes that she has been belching a lot since her pain started.  There are no aggravating factors.  Her back pain is not associated with bending forward, torso rotation, urination, defecation.  No nausea, vomiting, fevers, dysuria, urgency, frequency, cloudy or odorous urine, hematuria.  No abdominal distention, abdominal pain.  Had a normal bowel movement this morning.  Denies melena, hematochezia.  She has never had symptoms like this before.  No antipyretic in the past 6 to 8 hours.   9 mm nonobstructing right renal stone noted in the lower pole right renal collecting system without hydronephrosis on CT dated 08/27/2017 when she was admitted to the hospital for acute GI bleeding.    Past medical history of diabetes, chronic CHF, COPD on supplemental oxygen, recent diverticular disease/GI bleed, hypertension pyelonephritis.  She is status post cholecystectomy and C-section x3.  No history of nephrolithiasis. PMD: [facility], [place].  Urology: Dr. Deberah Pelton  Past Medical History:  Diagnosis Date  . Acute on chronic respiratory failure with hypoxia and hypercapnia ([place]) 01/07/2015  . Anemia   . 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 Cataract   . CHF (congestive heart failure) ([place])   . Chronic kidney disease 08/10/2017  . CKD (chronic kidney disease)   . COPD (chronic obstructive pulmonary disease) ([place])   . Diabetes mellitus without complication ([place])   . Edema, peripheral 04/20/2014  . Hypertension   . Iron deficiency anemia 06/22/2014  . Leucocytosis 10/19/2015  . Primary osteoarthritis of right knee 09/01/2016  . Renal insufficiency   . Sciatica 01/07/2015    Past Surgical History:   Procedure Laterality Date  . CESAREAN SECTION    . CHOLECYSTECTOMY    . COLONOSCOPY WITH PROPOFOL N/A 08/28/2017   Procedure: COLONOSCOPY WITH PROPOFOL;  Surgeon: Lucilla Lame, MD;  Location[facility] ENDOSCOPY;  Service: Endoscopy;  Laterality: N/A;  . COLONOSCOPY WITH PROPOFOL N/A 08/29/2017   Procedure: COLONOSCOPY WITH PROPOFOL;  Surgeon: Lucilla Lame, MD;  Location: [facility]COPY;  Service: Endoscopy;  Laterality: N/A;  . EYE SURGERY      Family History  Family history unknown: Yes    Social History   Tobacco Use  . Smoking status: Former Research scientist (life sciences)  . Smokeless tobacco: Never Used  Substance Use Topics  . Alcohol use: No  . Drug use: No    No current facility-administered medications for this encounter.   Current Outpatient Medications:  .  atorvastatin (LIPITOR) 10 MG tablet, Take 1 tablet by mouth daily., Disp: , Rfl:  .  canagliflozin (INVOKANA) 100 MG TABS tablet, Take 1 tablet by mouth daily., Disp: , Rfl:  .  conjugated estrogens (PREMARIN) vaginal cream, Apply 0.46m (pea-sized amount)  just inside the vaginal introitus with a finger-tip on  Monday, Wednesday and Friday nights., Disp: 30 g, Rfl: 12 .  estradiol (ESTRACE VAGINAL) 0.1 MG/GM vaginal cream, Apply 0.540m(pea-sized amount)  just inside the vaginal introitus with a finger-tip on Monday, Wednesday and Friday nights., Disp: 30 g, Rfl: 12 .  fluticasone furoate-vilanterol (BREO ELLIPTA) 100-25 MCG/INH AEPB, Inhale 1 puff into the lungs daily., Disp: , Rfl:  .  HUMALOG 100 UNIT/ML injection, Inject 76 Units into the skin daily.  In VGO device, Disp: , Rfl: 5 .  hydrochlorothiazide (HYDRODIURIL) 25 MG tablet, Take 25 mg by mouth daily., Disp: , Rfl: 11 .  Insulin Disposable Pump (V-GO 40) KIT, Inject into the skin as directed., Disp: , Rfl: 5 .  metFORMIN (GLUCOPHAGE) 500 MG tablet, Take 1,000 mg by mouth 2 (two) times daily with a meal. , Disp: , Rfl:  .  mirabegron ER (MYRBETRIQ) 25 MG TB24 tablet, Take 1 tablet (25 mg  total) by mouth daily., Disp: 30 tablet, Rfl: 12 .  montelukast (SINGULAIR) 10 MG tablet, Take 10 mg by mouth at bedtime., Disp: , Rfl:  .  OXYGEN, Place 3 L/min into the nose. , Disp: , Rfl:   Allergies  Allergen Reactions  . Ace Inhibitors   . Gabapentin Hives  . Lisinopril   . Lyrica [Pregabalin]      ROS  As noted in HPI.   Physical Exam  BP (!) 124/51 (BP Location: Left Arm)   Pulse 88   Temp 98.2 F (36.8 C) (Oral)   Resp 16   Wt 228 lb (103.4 kg)   SpO2 97%   BMI 40.39 kg/m   Constitutional: Well developed, well nourished, mild painful distress Eyes:  EOMI, conjunctiva normal bilaterally HENT: Normocephalic, atraumatic,mucus membranes moist Respiratory: Normal inspiratory effort Cardiovascular: Normal rate GI: nondistended.  Active bowel sounds.  Normal appearance.  Lap chole scars.  No right upper quadrant tenderness.  No suprapubic tenderness.  Positive right upper flank tenderness.  No guarding, rebound.   Back: Positive right CVAT.  No paralumbar or L-spine tenderness Skin: No rash, skin intact Musculoskeletal: no deformities Neurologic: Alert & oriented x 3, no focal neuro deficits Psychiatric: Speech and behavior appropriate   ED Course   Medications  acetaminophen (TYLENOL) tablet 1,000 mg (1,000 mg Oral Given 09/15/17 1149)    Orders Placed This Encounter  Procedures  . CT Renal Stone Study    Standing Status:   Standing    Number of Occurrences:   1    Order Specific Question:   Radiology Contrast Protocol - do NOT remove file path    Answer:   [email or web address]    Order Specific Question:   ** REASON FOR EXAM (FREE TEXT)    Answer:   evaluate for stone movement, hydroneprosis  . Urinalysis, Complete w Microscopic    Standing Status:   Standing    Number of Occurrences:   1    Results for orders placed or performed during the hospital encounter of 09/15/17 (from the past 24 hour(s))  Urinalysis, Complete w  Microscopic     Status: Abnormal   Collection Time: 09/15/17 11:49 AM  Result Value Ref Range   Color, Urine YELLOW YELLOW   APPearance CLEAR CLEAR   Specific Gravity, Urine 1.015 1.005 - 1.030   pH 8.5 (H) 5.0 - 8.0   Glucose, UA 500 (A) NEGATIVE mg/dL   Hgb urine dipstick NEGATIVE NEGATIVE   Bilirubin Urine NEGATIVE NEGATIVE   Ketones, ur NEGATIVE NEGATIVE mg/dL   Protein, ur NEGATIVE NEGATIVE mg/dL   Nitrite NEGATIVE NEGATIVE   Leukocytes, UA TRACE (A) NEGATIVE   Squamous Epithelial / LPF 0-5 0 - 5   WBC, UA 6-10 0 - 5 WBC/hpf   RBC / HPF 0-5 0 - 5 RBC/hpf   Bacteria, UA MANY (A) NONE SEEN   WBC Clumps PRESENT    Ct Renal Stone Study  Result Date: 09/15/2017 CLINICAL DATA:  RIGHT-sided abdominal pain. EXAM: CT ABDOMEN AND  PELVIS WITHOUT CONTRAST TECHNIQUE: Multidetector CT imaging of the abdomen and pelvis was performed following the standard protocol without IV contrast. COMPARISON:  CT of the abdomen and pelvis on 08/27/2017 the FINDINGS: Lower chest: There is mitral calcification. Heart size appears normal. Lung bases are unremarkable. Hepatobiliary: Status post cholecystectomy.  No liver lesions. Pancreas: Unremarkable. No pancreatic ductal dilatation or surrounding inflammatory changes. Spleen: Normal in size without focal abnormality. Adrenals/Urinary Tract: Renal glands are normal. There is moderate RIGHT hydronephrosis secondary to a calculus at the ureteropelvic junction which measures 9 millimeters in diameter. There is RIGHT perinephric stranding. The distal RIGHT ureter is unremarkable. The LEFT kidney and ureter are unremarkable. Urinary bladder is normal in appearance. Stomach/Bowel: The stomach and small bowel loops are normal in appearance. There are numerous colonic diverticula but no associated inflammation. Appendix is not seen. Vascular/Lymphatic: There is atherosclerotic calcification of the abdominal aorta not associated with aneurysm. No abdominal adenopathy. There is  subtle stranding in the mesenteric fat not associated significant adenopathy. Reproductive: Uterus is present.  No adnexal mass. Other: No free pelvic fluid. There is anterior abdominal wall laxity. No hernia. Musculoskeletal: There is grade 1 anterolisthesis of L4 on L5, measuring 7 millimeters. Degenerative changes are seen in the LOWER thoracic and lumbar spine. IMPRESSION: 1. 9 millimeter obstructing stone at the RIGHT ureteropelvic junction with associated hydronephrosis and perinephric stranding. 2. Mitral calcifications. 3. Cholecystectomy. 4. Significant colonic diverticulosis. 5.  Aortic atherosclerosis.  (ICD10-I70.0) 6. Spondylosis.  Grade 1 anterolisthesis of L4 on L5. Electronically Signed   By: Nolon Nations M.D.   On: 09/15/2017 11:54    ED Clinical Impression  Urinary tract obstruction by kidney stone  Urinary tract infection without hematuria, site unspecified  Hydronephrosis with ureteropelvic junction (UPJ) obstruction   ED Assessment/Plan  Outside records reviewed.  As noted in HPI.  Giving her thousand milligrams of Tylenol.  No NSAIDs because of the recent GI bleed.  Will obtain a UA.  Concern for stone movement causing her symptoms.  Will obtain a CT abdomen renal protocol to evaluate for stone movement and for hydronephrosis.  This will give [place] better information than a KUB.  Do not think that this is constipation as she had a normal bowel movement this morning.  She is status post cholecystectomy.  She has no abdominal pain or distention.  While gas is in the differential, feel it is less likely.  [place] Narcotic database reviewed for this patient, and feel that the risk/benefit ratio today is favorable for proceeding with a prescription for controlled substance.  Reviewed imaging independently.  9 mm obstructing stone at the right ureteropelvic junction with hydronephrosis and perinephric stranding.  See radiology report for full details.  Previous CT 6/24:  nonobstructive 9 mm calculus in the lower pole right renal collecting system  UA suggestive of urinary tract infection.  Given that she now has hydronephrosis with perinephric stranding, discussed with Dr. Alyson Ingles, who recommends sending her to the ER for evaluation and urological consultation.  Discussed labs, imaging, rationale for transfer to the emergency department with patient and spouse.  They agreed to [facility] to [facility].  Notified charge nurse. .   Meds ordered this encounter  Medications  . acetaminophen (TYLENOL) tablet 1,000 mg    *This clinic note was created using [employer]. Therefore, there may be occasional mistakes despite careful proofreading.   ?   Melynda Ripple, MD 09/15/17 1250

## 2017-09-15 NOTE — ED Notes (Signed)
OR transporter at bedside.

## 2017-09-15 NOTE — Anesthesia Post-op Follow-up Note (Signed)
Anesthesia QCDR form completed.        

## 2017-09-15 NOTE — Anesthesia Procedure Notes (Signed)
Procedure Name: LMA Insertion Date/Time: 09/15/2017 6:30 PM Performed by: Rolla Plate, CRNA Pre-anesthesia Checklist: Patient identified, Patient being monitored, Timeout performed, Emergency Drugs available and Suction available Patient Re-evaluated:Patient Re-evaluated prior to induction Oxygen Delivery Method: Circle system utilized Preoxygenation: Pre-oxygenation with 100% oxygen Induction Type: IV induction Ventilation: Mask ventilation without difficulty LMA: LMA inserted LMA Size: 3.5 Tube type: Oral Number of attempts: 1 Placement Confirmation: positive ETCO2 and breath sounds checked- equal and bilateral Tube secured with: Tape Dental Injury: Teeth and Oropharynx as per pre-operative assessment

## 2017-09-15 NOTE — ED Notes (Signed)
Report given to OR.

## 2017-09-15 NOTE — ED Notes (Signed)
Pt gave urine sample at [facility].

## 2017-09-15 NOTE — Progress Notes (Signed)
Advance care planning  Purpose of Encounter Is regarding acute hospitalization with ureteral obstruction/UTI.  CODE STATUS discussion.  Parties in Attendance Patient and husband at bedside  Patients Decisional capacity Patient is alert and oriented.  Able to make medical decisions.  Husband is the designated healthcare power of attorney.  She has discussed with the husband in the past regarding DO NOT RESUSCITATE and DO NOT INTUBATE.  Orders entered. Husband is aware of patient's wishes.  Time spent 17 minutes

## 2017-09-15 NOTE — Discharge Instructions (Addendum)
Go to the ER.  You need further evaluation and possible consultation by urology.

## 2017-09-15 NOTE — Op Note (Signed)
.  Preoperative diagnosis: right UPJ stone, sepsis  Postoperative diagnosis: Same  Procedure: 1 cystoscopy 2. right retrograde pyelography 3.  Intraoperative fluoroscopy, under one hour, with interpretation 4. right 6 x 24 JJ stent placement  Attending: Nicolette Bang  Anesthesia: General  Estimated blood loss: None  Drains: Right 6 x 24 JJ ureteral stent without tether, 16 French foley catheter  Specimens: none  Antibiotics: rocephin  Findings:right UPJ stone. Moderate hydronephrosis. No masses/lesions in the bladder. Ureteral orifices in normal anatomic location.  Indications: Patient is a 72 year old female with a history of right ureteral stone and concern for sepsis.  After discussing treatment options, they decided proceed with right stent placement.  Procedure her in detail: The patient was brought to the operating room and a brief timeout was done to ensure correct patient, correct procedure, correct site.  General anesthesia was administered patient was placed in dorsal lithotomy position.  Their genitalia was then prepped and draped in usual sterile fashion.  A rigid 67 French cystoscope was passed in the urethra and the bladder.  Bladder was inspected free masses or lesions.  the ureteral orifices were in the normal orthotopic locations.  a 6 french ureteral catheter was then instilled into the right ureteral orifice.  a gentle retrograde was obtained and findings noted above.  we then placed a zip wire through the ureteral catheter and advanced up to the renal pelvis.    We then placed a 6 x 24 double-j ureteral stent over the original zip wire.  We then removed the wire and good coil was noted in the the renal pelvis under fluoroscopy and the bladder under direct vision.  A foley catheter was then placed. the bladder was then drained and this concluded the procedure which was well tolerated by patient.  Complications: None  Condition: Stable, extubated, transferred to  PACU  Plan: Patient is to be admitted for IV antibiotics. She will be scheduled for her stone extraction in 2 weeks.

## 2017-09-15 NOTE — Transfer of Care (Signed)
Immediate Anesthesia Transfer of Care Note  Patient: Karen [name]  Procedure(s) Performed: CYSTOSCOPY WITH RETROGRADE PYELOGRAM/URETERAL STENT PLACEMENT (Right )  Patient Location: PACU  Anesthesia Type:General  Level of Consciousness: awake  Airway & Oxygen Therapy: Patient Spontanous Breathing and Patient connected to face mask oxygen  Post-op Assessment: Report given to RN and Post -op Vital signs reviewed and stable  Post vital signs: Reviewed  Last Vitals:  Vitals Value Taken Time  BP 139/63 09/15/2017  7:10 PM  Temp 36.4 C 09/15/2017  7:09 PM  Pulse 78 09/15/2017  7:10 PM  Resp 20 09/15/2017  7:10 PM  SpO2 100 % 09/15/2017  7:10 PM  Vitals shown include unvalidated device data.  Last Pain:  Vitals:   09/15/17 1750  TempSrc:   PainSc: 9          Complications: No apparent anesthesia complications

## 2017-09-15 NOTE — ED Triage Notes (Signed)
Pt arrives to ED From [place] for "known kidney stone" per pt. arrives with papers from [facility] stating "urinary tract obstruction by kidney stone, UTI without hematuria, hydronephrosis with IPJ obstruction." had CT performed. C/o R sided flank pain. Pain began last night. Denies N&V&fevers. Alert and oriented, in wheelchair. Normally on 3 L [place].

## 2017-09-16 ENCOUNTER — Encounter: Payer: Self-pay | Admitting: Urology

## 2017-09-16 LAB — CBC
HCT: 25.4 % — ABNORMAL LOW (ref 35.0–47.0)
HEMOGLOBIN: 8.4 g/dL — AB (ref 12.0–16.0)
MCH: 31 pg (ref 26.0–34.0)
MCHC: 33.2 g/dL (ref 32.0–36.0)
MCV: 93.4 fL (ref 80.0–100.0)
Platelets: 227 10*3/uL (ref 150–440)
RBC: 2.72 MIL/uL — ABNORMAL LOW (ref 3.80–5.20)
RDW: 15.8 % — AB (ref 11.5–14.5)
WBC: 9.9 10*3/uL (ref 3.6–11.0)

## 2017-09-16 LAB — BASIC METABOLIC PANEL
ANION GAP: 9 (ref 5–15)
BUN: 21 mg/dL (ref 8–23)
CALCIUM: 8.7 mg/dL — AB (ref 8.9–10.3)
CO2: 33 mmol/L — ABNORMAL HIGH (ref 22–32)
Chloride: 95 mmol/L — ABNORMAL LOW (ref 98–111)
Creatinine, Ser: 1.21 mg/dL — ABNORMAL HIGH (ref 0.44–1.00)
GFR calc Af Amer: 50 mL/min — ABNORMAL LOW (ref >60)
GFR, EST NON AFRICAN AMERICAN: 43 mL/min — AB (ref >60)
GLUCOSE: 259 mg/dL — AB (ref 70–99)
Potassium: 4.5 mmol/L (ref 3.5–5.1)
Sodium: 137 mmol/L (ref 135–145)

## 2017-09-16 LAB — GLUCOSE, CAPILLARY
GLUCOSE-CAPILLARY: 220 mg/dL — AB (ref 70–99)
Glucose-Capillary: 263 mg/dL — ABNORMAL HIGH (ref 70–99)
Glucose-Capillary: 259 mg/dL — ABNORMAL HIGH (ref 70–99)
Glucose-Capillary: 227 mg/dL — ABNORMAL HIGH (ref 70–99)
Glucose-Capillary: 185 mg/dL — ABNORMAL HIGH (ref 70–99)

## 2017-09-16 LAB — HEMOGLOBIN A1C
HEMOGLOBIN A1C: 6.1 % — AB (ref 4.8–5.6)
MEAN PLASMA GLUCOSE: 128.37 mg/dL

## 2017-09-16 MED ORDER — ENOXAPARIN SODIUM 40 MG/0.4ML ~~LOC~~ SOLN
40.0000 mg | SUBCUTANEOUS | Status: DC
  Administered 2017-09-16 – 2017-09-17 (×2): 40 mg via SUBCUTANEOUS
  Filled 2017-09-16 (×2): qty 0.4

## 2017-09-16 MED ORDER — INSULIN PUMP
Freq: Three times a day (TID) | SUBCUTANEOUS | Status: DC
  Administered 2017-09-16: 4 via SUBCUTANEOUS
  Administered 2017-09-16: 5 via SUBCUTANEOUS
  Administered 2017-09-17 (×3): 2 via SUBCUTANEOUS
  Administered 2017-09-17: 4 via SUBCUTANEOUS
  Administered 2017-09-17: 2 via SUBCUTANEOUS
  Administered 2017-09-18: 4 via SUBCUTANEOUS
  Administered 2017-09-18: 5 via SUBCUTANEOUS
  Filled 2017-09-16: qty 1

## 2017-09-16 NOTE — Progress Notes (Signed)
[place] at [facility]                                                                                                                                                                                  Patient Demographics   Karen [name], is a 72 y.o. female, DOB - 01/23/1946, WVP:[id number]  Admit date - 09/15/2017   Admitting Physician Hillary Bow, MD  Outpatient Primary MD for the patient is Ricardo Jericho, NP   LOS - 1  Subjective: Patient admitted with renal stones status post stent placement States that she is feeling little dizzy but other than that no significant pain   Review of Systems:   CONSTITUTIONAL: No documented fever. No fatigue, weakness. No weight gain, no weight loss.  EYES: No blurry or double vision.  ENT: No tinnitus. No postnasal drip. No redness of the oropharynx.  RESPIRATORY: No cough, no wheeze, no hemoptysis. No dyspnea.  CARDIOVASCULAR: No chest pain. No orthopnea. No palpitations. No syncope.  GASTROINTESTINAL: No nausea, no vomiting or diarrhea. No abdominal pain. No melena or hematochezia.  GENITOURINARY: No dysuria or hematuria.  ENDOCRINE: No polyuria or nocturia. No heat or cold intolerance.  HEMATOLOGY: No anemia. No bruising. No bleeding.  INTEGUMENTARY: No rashes. No lesions.  MUSCULOSKELETAL: No arthritis. No swelling. No gout.  NEUROLOGIC: No numbness, tingling, or ataxia. No seizure-type activity.  PSYCHIATRIC: No anxiety. No insomnia. No ADD.    Vitals:   Vitals:   09/15/17 2154 09/16/17 0003 09/16/17 0526 09/16/17 1256  BP: (!) 157/60 (!) 164/67 (!) 140/59 (!) 158/54  Pulse: 86 88 82 86  Resp: 20 20 18 20   Temp: 97.8 F (36.6 C) 98.6 F (37 C) 97.8 F (36.6 C) 98 F (36.7 C)  TempSrc: Oral Oral Oral Oral  SpO2: 97% 97% 97% 95%  Weight:      Height:        Wt Readings from Last 3 Encounters:  09/15/17 101.2 kg (223 lb)  09/15/17 103.4 kg (228 lb)  09/11/17 103.4 kg (228 lb)      Intake/Output Summary (Last 24 hours) at 09/16/2017 1343 Last data filed at 09/16/2017 1037 Gross per 24 hour  Intake 1000 ml  Output 1120 ml  Net -120 ml    Physical Exam:   GENERAL: Pleasant-appearing in no apparent distress.  HEAD, EYES, EARS, NOSE AND THROAT: Atraumatic, normocephalic. Extraocular muscles are intact. Pupils equal and reactive to light. Sclerae anicteric. No conjunctival injection. No oro-pharyngeal erythema.  NECK: Supple. There is no jugular venous distention. No bruits, no lymphadenopathy, no thyromegaly.  HEART: Regular rate and rhythm,. No murmurs, no rubs, no clicks.  LUNGS: Clear to  auscultation bilaterally. No rales or rhonchi. No wheezes.  ABDOMEN: Soft, flat, nontender, nondistended. Has good bowel sounds. No hepatosplenomegaly appreciated.  EXTREMITIES: No evidence of any cyanosis, clubbing, or peripheral edema.  +2 pedal and radial pulses bilaterally.  NEUROLOGIC: The patient is alert, awake, and oriented x3 with no focal motor or sensory deficits appreciated bilaterally.  SKIN: Moist and warm with no rashes appreciated.  Psych: Not anxious, depressed LN: No inguinal LN enlargement    Antibiotics   Anti-infectives (From admission, onward)   Start     Dose/Rate Route Frequency Ordered Stop   09/15/17 1715  cefTRIAXone (ROCEPHIN) 1 g in sodium chloride 0.9 % 100 mL IVPB     1 g 200 mL/hr over 30 Minutes Intravenous STAT 09/15/17 1700 09/15/17 1753      Medications   Scheduled Meds: . enoxaparin (LOVENOX) injection  40 mg Subcutaneous Q24H  . insulin pump   Subcutaneous TID AC, HS, 0200   Continuous Infusions: PRN Meds:.acetaminophen **OR** acetaminophen, albuterol, morphine injection, ondansetron **OR** ondansetron (ZOFRAN) IV, oxyCODONE, polyethylene glycol   Data Review:   Micro Results No results found for this or any previous visit (from the past 240 hour(s)).  Radiology Reports Ct Renal Stone Study  Result Date:  09/15/2017 CLINICAL DATA:  RIGHT-sided abdominal pain. EXAM: CT ABDOMEN AND PELVIS WITHOUT CONTRAST TECHNIQUE: Multidetector CT imaging of the abdomen and pelvis was performed following the standard protocol without IV contrast. COMPARISON:  CT of the abdomen and pelvis on 08/27/2017 the FINDINGS: Lower chest: There is mitral calcification. Heart size appears normal. Lung bases are unremarkable. Hepatobiliary: Status post cholecystectomy.  No liver lesions. Pancreas: Unremarkable. No pancreatic ductal dilatation or surrounding inflammatory changes. Spleen: Normal in size without focal abnormality. Adrenals/Urinary Tract: Renal glands are normal. There is moderate RIGHT hydronephrosis secondary to a calculus at the ureteropelvic junction which measures 9 millimeters in diameter. There is RIGHT perinephric stranding. The distal RIGHT ureter is unremarkable. The LEFT kidney and ureter are unremarkable. Urinary bladder is normal in appearance. Stomach/Bowel: The stomach and small bowel loops are normal in appearance. There are numerous colonic diverticula but no associated inflammation. Appendix is not seen. Vascular/Lymphatic: There is atherosclerotic calcification of the abdominal aorta not associated with aneurysm. No abdominal adenopathy. There is subtle stranding in the mesenteric fat not associated significant adenopathy. Reproductive: Uterus is present.  No adnexal mass. Other: No free pelvic fluid. There is anterior abdominal wall laxity. No hernia. Musculoskeletal: There is grade 1 anterolisthesis of L4 on L5, measuring 7 millimeters. Degenerative changes are seen in the LOWER thoracic and lumbar spine. IMPRESSION: 1. 9 millimeter obstructing stone at the RIGHT ureteropelvic junction with associated hydronephrosis and perinephric stranding. 2. Mitral calcifications. 3. Cholecystectomy. 4. Significant colonic diverticulosis. 5.  Aortic atherosclerosis.  (ICD10-I70.0) 6. Spondylosis.  Grade 1 anterolisthesis of  L4 on L5. Electronically Signed   By: Nolon Nations M.D.   On: 09/15/2017 11:54   US Pelvic Complete With Transvaginal  Result Date: 08/21/2017 CLINICAL DATA:  Clinical findings of uterine masses. History of incontinence and urinary urgency. Three previous cesarean sections. EXAM: TRANSABDOMINAL AND TRANSVAGINAL ULTRASOUND OF PELVIS TECHNIQUE: Both transabdominal and transvaginal ultrasound examinations of the pelvis were performed. Transabdominal technique was performed for global imaging of the pelvis including uterus, ovaries, adnexal regions, and pelvic cul-de-sac. It was necessary to proceed with endovaginal exam following the transabdominal exam to visualize the uterus, endometrium, ovaries, and adnexal regions. COMPARISON:  Abdominopelvic CT scan of April 02, 2014 FINDINGS: The transabdominal  images are somewhat limited due to the fact patient's urinary bladder was not full. Uterus Measurements: 5.2 x 2.7 x 3.3 cm. The myometrial echotexture is heterogeneous. No discrete fibroids or other masses are observed. Endometrium Thickness: 3.1 mm.  No focal abnormality visualized. Right ovary The right ovary could not be visualized. No adnexal masses were observed. Left ovary The left ovary could not be visualized. No adnexal masses were observed. Other findings No abnormal free fluid. IMPRESSION: Normal sized uterus with heterogeneous echotexture. No discrete fibroids are observed. Normal appearing endometrium. Nonvisualization of the ovaries which is not unusual at the patient's age. Normal appearing adnexal structures. Electronically Signed   By: David  Martinique M.D.   On: 08/21/2017 15:06   Ct Angio Abd/pel W/ And/or W/o  Result Date: 08/27/2017 CLINICAL DATA:  rectal bleeding. The symptoms started this afternoon. Patient states she had 3 episodes at home. Was bright red. She states it would fill the toilet. This was accompanied by some lower abdominal pain and discomfort. While here in the emergency  department she had another bloody bowel movement. She denies similar symptoms in the past. Denies history of hemorrhoids or straining. Denies having undergone colonoscopy in the past. Denies any history of intestinal disorder for herself or in her family. EXAM: CTA ABDOMEN AND PELVIS wITHOUT AND WITH CONTRAST TECHNIQUE: Multidetector CT imaging of the abdomen and pelvis was performed using the standard protocol before and during bolus administration of intravenous contrast. Multiplanar reconstructed images and MIPs were obtained and reviewed to evaluate the vascular anatomy. CONTRAST:  142mL ISOVUE-370 IOPAMIDOL (ISOVUE-370) INJECTION 76% COMPARISON:  04/02/2014 FINDINGS: VASCULAR Aorta: Scattered atheromatous calcified plaque in the infrarenal segment. No aneurysm, dissection, or stenosis. Celiac: Patent without evidence of aneurysm, dissection, vasculitis or significant stenosis. SMA: Patent without evidence of aneurysm, dissection, vasculitis or significant stenosis. Minimal eccentric nonocclusive calcified plaque. Renals: Both renal arteries are patent without evidence of aneurysm, dissection, vasculitis, fibromuscular dysplasia or significant stenosis. IMA: Patent without evidence of aneurysm, dissection, vasculitis or significant stenosis. Inflow: Patent without evidence of aneurysm, dissection, vasculitis or significant stenosis. Mild scattered calcified plaque. Proximal Outflow: Bilateral common femoral and visualized portions of the superficial and profunda femoral arteries are patent without evidence of aneurysm, dissection, vasculitis or significant stenosis. Veins: Patent hepatic veins, portal vein, SMV, splenic vein, bilateral renal veins, IVC, iliac venous system. Bilateral pelvic phleboliths. Review of the MIP images confirms the above findings. NON-VASCULAR Lower chest: No acute abnormality. Hepatobiliary: No focal liver abnormality is seen. Status post cholecystectomy. No biliary dilatation.  Pancreas: Mild diffuse atrophy.  No mass or ductal dilatation. Spleen: Normal in size without focal abnormality. Adrenals/Urinary Tract: Normal adrenal glands. 9 mm calculus in the lower pole right renal collecting system. No hydronephrosis. No focal renal lesion. Ureters decompressed. Urinary bladder incompletely distended. Stomach/Bowel: No evidence of extravasation. Stomach and small bowel are decompressed. Appendix not identified. No pericecal inflammatory/edematous change. Colon is nondilated. Ascending, descending, and sigmoid colon diverticula without significant adjacent inflammatory/edematous change or abscess. Lymphatic: Hyperdense normal-sized bilateral common iliac, right external iliac, and bilateral inguinal lymph nodes, stable. No abdominal adenopathy. Reproductive: Uterus and bilateral adnexa are unremarkable. Other: No ascites.  No free air. Musculoskeletal: Facet DJD in the lower lumbar spine probably accounting for the grade 1 anterolisthesis L4-5, stable since prior study. Negative for fracture or worrisome bone lesion. IMPRESSION: VASCULAR 1. Negative for active extravasation or vascular lesion to suggest an etiology of GI bleeding. 2.  Aortoiliac atherosclerosis (ICD10-170.0) NON-VASCULAR 1. No acute findings.  2. Extensive colonic diverticulosis. 3. Right nonobstructive nephrolithiasis. Electronically Signed   By: Lucrezia Europe M.D.   On: 08/27/2017 11:00     CBC Recent Labs  Lab 09/11/17 1626 09/15/17 1401 09/16/17 0533  WBC 10.9 12.5* 9.9  HGB 8.4* 9.2* 8.4*  HCT 24.3* 27.6* 25.4*  PLT 301 271 227  MCV 93.2 92.1 93.4  MCH 32.1 30.7 31.0  MCHC 34.4 33.4 33.2  RDW 16.6* 15.8* 15.8*  LYMPHSABS 1.7  --   --   MONOABS 0.6  --   --   EOSABS 0.4  --   --   BASOSABS 0.1  --   --     Chemistries  Recent Labs  Lab 09/11/17 1626 09/15/17 1401 09/16/17 0533  NA 137 140 137  K 4.2 4.3 4.5  CL 100 94* 95*  CO2 29 34* 33*  GLUCOSE 206* 229* 259*  BUN 18 19 21   CREATININE 0.89  1.50* 1.21*  CALCIUM 8.9 10.0 8.7*  AST 17  --   --   ALT 14  --   --   ALKPHOS 52  --   --   BILITOT 0.7  --   --    ------------------------------------------------------------------------------------------------------------------ estimated creatinine clearance is 47 mL/min (A) (by C-G formula based on SCr of 1.21 mg/dL (H)). ------------------------------------------------------------------------------------------------------------------ Recent Labs    09/15/17 1401  HGBA1C 6.1*   ------------------------------------------------------------------------------------------------------------------ No results for input(s): CHOL, HDL, LDLCALC, TRIG, CHOLHDL, LDLDIRECT in the last 72 hours. ------------------------------------------------------------------------------------------------------------------ No results for input(s): TSH, T4TOTAL, T3FREE, THYROIDAB in the last 72 hours.  Invalid input(s): FREET3 ------------------------------------------------------------------------------------------------------------------ No results for input(s): VITAMINB12, FOLATE, FERRITIN, TIBC, IRON, RETICCTPCT in the last 72 hours.  Coagulation profile Recent Labs  Lab 09/15/17 1746  INR 0.89    No results for input(s): DDIMER in the last 72 hours.  Cardiac Enzymes No results for input(s): CKMB, TROPONINI, MYOGLOBIN in the last 168 hours.  Invalid input(s): CK ------------------------------------------------------------------------------------------------------------------ Invalid input(s): [place]  Patient 71 year old admitted with renal stone with UTI  *9 mm right ureteropelvic junction stone with associated hydronephrosis and UTI Continue IV antibiotics await urine cultures  *Acute kidney injury due to ureteral obstruction and UTI.  Discontinue IV fluid Now improved  *Diabetes mellitus.  Patient will be on every 4 Accu-Cheks.  N.p.o.  Sliding scale  insulin.  *COPD with chronic respiratory failure.  Continue oxygen.  Nebulizers.  Home inhalers.  *Chronic diastolic congestive heart failure with no signs of fluid overload.  Continue IV fluids       Code Status Orders  (From admission, onward)        Start     Ordered   09/15/17 1747  Do not attempt resuscitation (DNR)  Continuous    Question Answer Comment  In the event of cardiac or respiratory ARREST Do not call a "code blue"   In the event of cardiac or respiratory ARREST Do not perform Intubation, CPR, defibrillation or ACLS   In the event of cardiac or respiratory ARREST Use medication by any route, position, wound care, and other measures to relive pain and suffering. May use oxygen, suction and manual treatment of airway obstruction as needed for comfort.      09/15/17 1746    Code Status History    Date Active Date Inactive Code Status Order ID Comments User Context   08/26/2017 2216 08/30/2017 1604 Full Code [id number]  Nicholes Mango, MD Inpatient   01/08/2015 0307 01/09/2015 1336 Full Code [id number]  Theodoro Grist, MD Inpatient           Consults urology  DVT Prophylaxis  Lovenox   Lab Results  Component Value Date   PLT 227 09/16/2017     Time Spent in minutes   35 minutes  Greater than 50% of time spent in care coordination and counseling patient regarding the condition and plan of care.   Dustin Flock M.D on 09/16/2017 at 1:43 PM  Between 7am to 6pm - Pager - [phone]  After 6pm go to [email or web address] - Proofreader  [employer]   Office  [phone]

## 2017-09-17 ENCOUNTER — Ambulatory Visit: Payer: Medicare Other | Admitting: Gastroenterology

## 2017-09-17 ENCOUNTER — Inpatient Hospital Stay: Payer: Medicare Other

## 2017-09-17 DIAGNOSIS — Z862 Personal history of diseases of the blood and blood-forming organs and certain disorders involving the immune mechanism: Secondary | ICD-10-CM | POA: Insufficient documentation

## 2017-09-17 LAB — GLUCOSE, CAPILLARY
GLUCOSE-CAPILLARY: 163 mg/dL — AB (ref 70–99)
GLUCOSE-CAPILLARY: 208 mg/dL — AB (ref 70–99)
Glucose-Capillary: 175 mg/dL — ABNORMAL HIGH (ref 70–99)
Glucose-Capillary: 160 mg/dL — ABNORMAL HIGH (ref 70–99)
Glucose-Capillary: 236 mg/dL — ABNORMAL HIGH (ref 70–99)
Glucose-Capillary: 237 mg/dL — ABNORMAL HIGH (ref 70–99)

## 2017-09-17 MED ORDER — SODIUM CHLORIDE 0.9 % IV SOLN
1.0000 g | INTRAVENOUS | Status: DC
  Administered 2017-09-17: 1 g via INTRAVENOUS
  Filled 2017-09-17: qty 10
  Filled 2017-09-17: qty 1

## 2017-09-17 NOTE — Progress Notes (Signed)
Inpatient Diabetes Program Recommendations  AACE/ADA: New Consensus Statement on Inpatient Glycemic Control (2019)  Target Ranges:  Prepandial:   less than 140 mg/dL      Peak postprandial:   less than 180 mg/dL (1-2 hours)      Critically ill patients:  140 - 180 mg/dL  Results for KAMMY, KLETT (MRN [id number]) as of 09/17/2017 13:44  Ref. Range 09/16/2017 07:50 09/16/2017 12:05 09/16/2017 16:33 09/16/2017 20:01 09/17/2017 00:06 09/17/2017 02:37 09/17/2017 07:42 09/17/2017 11:40  Glucose-Capillary Latest Ref Range: 70 - 99 mg/dL 220 (H) 259 (H) 227 (H) 185 (H) 163 (H) 175 (H) 160 (H) 236 (H)    Review of Glycemic Control  Outpatient Diabetes medications: Metformin 1000mg BID, Invokana 100 mg daily, V-GO 40 DisposableInsulin Pump (provides 40 units of basal insulin per 24 hours; also allows patient to use clicks to give additional units of insulin with meals as each click is 2 units) Current orders for Inpatient glycemic control: Insulin Pump ACHS&2am  Insulin Pump: Recommend patient continue to use her V-GO 40 insulin pump for glycemic control while inpatient.   NOTE: Patient is ordered insulin pump for DM control as an inpatient. Talked with Minette Brine, RN this morning and confirmed patient had V-Go insulin pump on. Talked with patient and her husband. Patient has her V-Go insulin pump on and has additional V-Go insulin pumps to use if needed while inpatient. Patient uses V-GO 40 which is a disposable insulin pump which provides 40 units of basal insulin over 24 hour period and also allows patient to use additional insulin for meal coverage (each click provides 2 units of insulin). Patient states that she usually gives herself 3 clicks (which is 6 units of insulin) for meals and 1 click (which is 2 units) for snacks.  Patient states that her glucose typically runs well. Over the past few days prior to admission glucose ranged from 78-200 mg/dl.  Patient denies any issues with hypoglycemia. Patient  prefers to continue to use her insulin pump for glycemic control while inpatient. Patient reports that she will likely be discharged tomorrow. Will continue to follow.  Thanks, Barnie Alderman, RN, MSN, CDE Diabetes Coordinator Inpatient Diabetes Program [phone] (Team Pager from 8am to 5pm)

## 2017-09-17 NOTE — Progress Notes (Signed)
[place] [facility]                                                                                                                                                                                  Patient Demographics   Karen [name], is a 72 y.o. female, DOB - 06/04/1945, WRU:[id number]  Admit date - 09/15/2017   Admitting Physician Hillary Bow, MD  Outpatient Primary MD for the patient is Ricardo Jericho, NP   LOS - 2  Subjective: Today complains of nausea and not feeling well.  Review of Systems:   CONSTITUTIONAL: No documented fever. No fatigue, weakness. No weight gain, no weight loss.  EYES: No blurry or double vision.  ENT: No tinnitus. No postnasal drip. No redness of the oropharynx.  RESPIRATORY: No cough, no wheeze, no hemoptysis. No dyspnea.  CARDIOVASCULAR: No chest pain. No orthopnea. No palpitations. No syncope.  GASTROINTESTINAL: No nausea, no vomiting or diarrhea. No abdominal pain. No melena or hematochezia.  GENITOURINARY: No dysuria or hematuria.  ENDOCRINE: No polyuria or nocturia. No heat or cold intolerance.  HEMATOLOGY: No anemia. No bruising. No bleeding.  INTEGUMENTARY: No rashes. No lesions.  MUSCULOSKELETAL: No arthritis. No swelling. No gout.  NEUROLOGIC: No numbness, tingling, or ataxia. No seizure-type activity.  PSYCHIATRIC: No anxiety. No insomnia. No ADD.    Vitals:   Vitals:   09/16/17 1955 09/17/17 0440 09/17/17 1311 09/17/17 1411  BP: (!) 129/48 (!) 158/51 (!) 152/54   Pulse: 88 86 77   Resp: 20 (!) 22 16   Temp: 98.3 F (36.8 C) 98 F (36.7 C) 98.4 F (36.9 C)   TempSrc: Oral Oral Oral   SpO2: 96% 97% 97% 90%  Weight:      Height:        Wt Readings from Last 3 Encounters:  09/15/17 101.2 kg (223 lb)  09/15/17 103.4 kg (228 lb)  09/11/17 103.4 kg (228 lb)     Intake/Output Summary (Last 24 hours) at 09/17/2017 1453 Last data filed at 09/17/2017 1351 Gross per 24 hour  Intake 840 ml   Output 2000 ml  Net -1160 ml    Physical Exam:   GENERAL: Pleasant-appearing in no apparent distress.  HEAD, EYES, EARS, NOSE AND THROAT: Atraumatic, normocephalic. Extraocular muscles are intact. Pupils equal and reactive to light. Sclerae anicteric. No conjunctival injection. No oro-pharyngeal erythema.  NECK: Supple. There is no jugular venous distention. No bruits, no lymphadenopathy, no thyromegaly.  HEART: Regular rate and rhythm,. No murmurs, no rubs, no clicks.  LUNGS: Clear to auscultation bilaterally. No rales or rhonchi. No wheezes.  ABDOMEN: Soft, flat, nontender, nondistended. Has good bowel sounds. No  hepatosplenomegaly appreciated.  EXTREMITIES: No evidence of any cyanosis, clubbing, or peripheral edema.  +2 pedal and radial pulses bilaterally.  NEUROLOGIC: The patient is alert, awake, and oriented x3 with no focal motor or sensory deficits appreciated bilaterally.  SKIN: Moist and warm with no rashes appreciated.  Psych: Not anxious, depressed LN: No inguinal LN enlargement    Antibiotics   Anti-infectives (From admission, onward)   Start     Dose/Rate Route Frequency Ordered Stop   09/17/17 1100  cefTRIAXone (ROCEPHIN) 1 g in sodium chloride 0.9 % 100 mL IVPB     1 g 200 mL/hr over 30 Minutes Intravenous Every 24 hours 09/17/17 1039     09/15/17 1715  cefTRIAXone (ROCEPHIN) 1 g in sodium chloride 0.9 % 100 mL IVPB     1 g 200 mL/hr over 30 Minutes Intravenous STAT 09/15/17 1700 09/15/17 1753      Medications   Scheduled Meds: . enoxaparin (LOVENOX) injection  40 mg Subcutaneous Q24H  . insulin pump   Subcutaneous TID AC, HS, 0200   Continuous Infusions: . cefTRIAXone (ROCEPHIN)  IV Stopped (09/17/17 1254)   PRN Meds:.acetaminophen **OR** acetaminophen, albuterol, morphine injection, ondansetron **OR** ondansetron (ZOFRAN) IV, oxyCODONE, polyethylene glycol   Data Review:   Micro Results Recent Results (from the past 240 hour(s))  Urine Culture      Status: Abnormal (Preliminary result)   Collection Time: 09/15/17  5:19 PM  Result Value Ref Range Status   Specimen Description   Final    URINE, CLEAN CATCH Performed at [facility], [address], [place], [place] [address]    Special Requests   Final    Normal Performed at [facility], [address] [place], [place] [address]    Culture >=100,000 COLONIES/mL ESCHERICHIA COLI (A)  Final   Report Status PENDING  Incomplete    Radiology Reports Dg Abd 1 View  Result Date: 09/17/2017 CLINICAL DATA:  Stone removal with stent placement. EXAM: ABDOMEN - 1 VIEW COMPARISON:  09/15/2017. FINDINGS: There is been interval placement of a right-sided double-J nephroureteral stent. The proximal portion of the stent is in the projection of the expected location of the right renal pelvis. The distal portion is in the pelvis. Calcific density adjacent to the mid portion of the right stent measuring 7 mm is identified and may represent ureteral calculus. Previous cholecystectomy. IMPRESSION: 1. Interval placement of right-sided nephroureteral stent. 2. Calcific density along the course of the right ureter measuring 7 mm may represent patient's ureteral calculus. Electronically Signed   By: Kerby Moors M.D.   On: 09/17/2017 11:30   Ct Renal Stone Study  Result Date: 09/15/2017 CLINICAL DATA:  RIGHT-sided abdominal pain. EXAM: CT ABDOMEN AND PELVIS WITHOUT CONTRAST TECHNIQUE: Multidetector CT imaging of the abdomen and pelvis was performed following the standard protocol without IV contrast. COMPARISON:  CT of the abdomen and pelvis on 08/27/2017 the FINDINGS: Lower chest: There is mitral calcification. Heart size appears normal. Lung bases are unremarkable. Hepatobiliary: Status post cholecystectomy.  No liver lesions. Pancreas: Unremarkable. No pancreatic ductal dilatation or surrounding inflammatory changes. Spleen: Normal in size without focal abnormality. Adrenals/Urinary Tract:  Renal glands are normal. There is moderate RIGHT hydronephrosis secondary to a calculus at the ureteropelvic junction which measures 9 millimeters in diameter. There is RIGHT perinephric stranding. The distal RIGHT ureter is unremarkable. The LEFT kidney and ureter are unremarkable. Urinary bladder is normal in appearance. Stomach/Bowel: The stomach and small bowel loops are normal in appearance.  There are numerous colonic diverticula but no associated inflammation. Appendix is not seen. Vascular/Lymphatic: There is atherosclerotic calcification of the abdominal aorta not associated with aneurysm. No abdominal adenopathy. There is subtle stranding in the mesenteric fat not associated significant adenopathy. Reproductive: Uterus is present.  No adnexal mass. Other: No free pelvic fluid. There is anterior abdominal wall laxity. No hernia. Musculoskeletal: There is grade 1 anterolisthesis of L4 on L5, measuring 7 millimeters. Degenerative changes are seen in the LOWER thoracic and lumbar spine. IMPRESSION: 1. 9 millimeter obstructing stone at the RIGHT ureteropelvic junction with associated hydronephrosis and perinephric stranding. 2. Mitral calcifications. 3. Cholecystectomy. 4. Significant colonic diverticulosis. 5.  Aortic atherosclerosis.  (ICD10-I70.0) 6. Spondylosis.  Grade 1 anterolisthesis of L4 on L5. Electronically Signed   By: Nolon Nations M.D.   On: 09/15/2017 11:54   US Pelvic Complete With Transvaginal  Result Date: 08/21/2017 CLINICAL DATA:  Clinical findings of uterine masses. History of incontinence and urinary urgency. Three previous cesarean sections. EXAM: TRANSABDOMINAL AND TRANSVAGINAL ULTRASOUND OF PELVIS TECHNIQUE: Both transabdominal and transvaginal ultrasound examinations of the pelvis were performed. Transabdominal technique was performed for global imaging of the pelvis including uterus, ovaries, adnexal regions, and pelvic cul-de-sac. It was necessary to proceed with endovaginal  exam following the transabdominal exam to visualize the uterus, endometrium, ovaries, and adnexal regions. COMPARISON:  Abdominopelvic CT scan of April 02, 2014 FINDINGS: The transabdominal images are somewhat limited due to the fact patient's urinary bladder was not full. Uterus Measurements: 5.2 x 2.7 x 3.3 cm. The myometrial echotexture is heterogeneous. No discrete fibroids or other masses are observed. Endometrium Thickness: 3.1 mm.  No focal abnormality visualized. Right ovary The right ovary could not be visualized. No adnexal masses were observed. Left ovary The left ovary could not be visualized. No adnexal masses were observed. Other findings No abnormal free fluid. IMPRESSION: Normal sized uterus with heterogeneous echotexture. No discrete fibroids are observed. Normal appearing endometrium. Nonvisualization of the ovaries which is not unusual at the patient's age. Normal appearing adnexal structures. Electronically Signed   By: David  Martinique M.D.   On: 08/21/2017 15:06   Ct Angio Abd/pel W/ And/or W/o  Result Date: 08/27/2017 CLINICAL DATA:  rectal bleeding. The symptoms started this afternoon. Patient states she had 3 episodes at home. Was bright red. She states it would fill the toilet. This was accompanied by some lower abdominal pain and discomfort. While here in the emergency department she had another bloody bowel movement. She denies similar symptoms in the past. Denies history of hemorrhoids or straining. Denies having undergone colonoscopy in the past. Denies any history of intestinal disorder for herself or in her family. EXAM: CTA ABDOMEN AND PELVIS wITHOUT AND WITH CONTRAST TECHNIQUE: Multidetector CT imaging of the abdomen and pelvis was performed using the standard protocol before and during bolus administration of intravenous contrast. Multiplanar reconstructed images and MIPs were obtained and reviewed to evaluate the vascular anatomy. CONTRAST:  155mL ISOVUE-370 IOPAMIDOL  (ISOVUE-370) INJECTION 76% COMPARISON:  04/02/2014 FINDINGS: VASCULAR Aorta: Scattered atheromatous calcified plaque in the infrarenal segment. No aneurysm, dissection, or stenosis. Celiac: Patent without evidence of aneurysm, dissection, vasculitis or significant stenosis. SMA: Patent without evidence of aneurysm, dissection, vasculitis or significant stenosis. Minimal eccentric nonocclusive calcified plaque. Renals: Both renal arteries are patent without evidence of aneurysm, dissection, vasculitis, fibromuscular dysplasia or significant stenosis. IMA: Patent without evidence of aneurysm, dissection, vasculitis or significant stenosis. Inflow: Patent without evidence of aneurysm, dissection, vasculitis or significant stenosis. Mild  scattered calcified plaque. Proximal Outflow: Bilateral common femoral and visualized portions of the superficial and profunda femoral arteries are patent without evidence of aneurysm, dissection, vasculitis or significant stenosis. Veins: Patent hepatic veins, portal vein, SMV, splenic vein, bilateral renal veins, IVC, iliac venous system. Bilateral pelvic phleboliths. Review of the MIP images confirms the above findings. NON-VASCULAR Lower chest: No acute abnormality. Hepatobiliary: No focal liver abnormality is seen. Status post cholecystectomy. No biliary dilatation. Pancreas: Mild diffuse atrophy.  No mass or ductal dilatation. Spleen: Normal in size without focal abnormality. Adrenals/Urinary Tract: Normal adrenal glands. 9 mm calculus in the lower pole right renal collecting system. No hydronephrosis. No focal renal lesion. Ureters decompressed. Urinary bladder incompletely distended. Stomach/Bowel: No evidence of extravasation. Stomach and small bowel are decompressed. Appendix not identified. No pericecal inflammatory/edematous change. Colon is nondilated. Ascending, descending, and sigmoid colon diverticula without significant adjacent inflammatory/edematous change or abscess.  Lymphatic: Hyperdense normal-sized bilateral common iliac, right external iliac, and bilateral inguinal lymph nodes, stable. No abdominal adenopathy. Reproductive: Uterus and bilateral adnexa are unremarkable. Other: No ascites.  No free air. Musculoskeletal: Facet DJD in the lower lumbar spine probably accounting for the grade 1 anterolisthesis L4-5, stable since prior study. Negative for fracture or worrisome bone lesion. IMPRESSION: VASCULAR 1. Negative for active extravasation or vascular lesion to suggest an etiology of GI bleeding. 2.  Aortoiliac atherosclerosis (ICD10-170.0) NON-VASCULAR 1. No acute findings. 2. Extensive colonic diverticulosis. 3. Right nonobstructive nephrolithiasis. Electronically Signed   By: Lucrezia Europe M.D.   On: 08/27/2017 11:00     CBC Recent Labs  Lab 09/11/17 1626 09/15/17 1401 09/16/17 0533  WBC 10.9 12.5* 9.9  HGB 8.4* 9.2* 8.4*  HCT 24.3* 27.6* 25.4*  PLT 301 271 227  MCV 93.2 92.1 93.4  MCH 32.1 30.7 31.0  MCHC 34.4 33.4 33.2  RDW 16.6* 15.8* 15.8*  LYMPHSABS 1.7  --   --   MONOABS 0.6  --   --   EOSABS 0.4  --   --   BASOSABS 0.1  --   --     Chemistries  Recent Labs  Lab 09/11/17 1626 09/15/17 1401 09/16/17 0533  NA 137 140 137  K 4.2 4.3 4.5  CL 100 94* 95*  CO2 29 34* 33*  GLUCOSE 206* 229* 259*  BUN 18 19 21   CREATININE 0.89 1.50* 1.21*  CALCIUM 8.9 10.0 8.7*  AST 17  --   --   ALT 14  --   --   ALKPHOS 52  --   --   BILITOT 0.7  --   --    ------------------------------------------------------------------------------------------------------------------ estimated creatinine clearance is 47 mL/min (A) (by C-G formula based on SCr of 1.21 mg/dL (H)). ------------------------------------------------------------------------------------------------------------------ Recent Labs    09/15/17 1401  HGBA1C 6.1*   ------------------------------------------------------------------------------------------------------------------ No  results for input(s): CHOL, HDL, LDLCALC, TRIG, CHOLHDL, LDLDIRECT in the last 72 hours. ------------------------------------------------------------------------------------------------------------------ No results for input(s): TSH, T4TOTAL, T3FREE, THYROIDAB in the last 72 hours.  Invalid input(s): FREET3 ------------------------------------------------------------------------------------------------------------------ No results for input(s): VITAMINB12, FOLATE, FERRITIN, TIBC, IRON, RETICCTPCT in the last 72 hours.  Coagulation profile Recent Labs  Lab 09/15/17 1746  INR 0.89    No results for input(s): DDIMER in the last 72 hours.  Cardiac Enzymes No results for input(s): CKMB, TROPONINI, MYOGLOBIN in the last 168 hours.  Invalid input(s): CK ------------------------------------------------------------------------------------------------------------------ Invalid input(s): [place]  Patient 72 year old admitted with renal stone with UTI  *9 mm right ureteropelvic junction stone  with associated hydronephrosis and UTI Continue IV antibiotics, urine culture growing  greater than 100,000 bacteria  *Acute kidney injury due to ureteral obstruction and UTI.  Repeat renal function in the morning  *Diabetes mellitus.  Patient will be on every 4 Accu-Cheks.  .  Sliding scale insulin.  *COPD with chronic respiratory failure.  Continue oxygen.  Nebulizers.  Home inhalers.  *Chronic diastolic congestive heart failure with no signs of fluid overload.  Currently compensated       Code Status Orders  (From admission, onward)        Start     Ordered   09/15/17 1747  Do not attempt resuscitation (DNR)  Continuous    Question Answer Comment  In the event of cardiac or respiratory ARREST Do not call a "code blue"   In the event of cardiac or respiratory ARREST Do not perform Intubation, CPR, defibrillation or ACLS   In the event of cardiac or respiratory  ARREST Use medication by any route, position, wound care, and other measures to relive pain and suffering. May use oxygen, suction and manual treatment of airway obstruction as needed for comfort.      09/15/17 1746    Code Status History    Date Active Date Inactive Code Status Order ID Comments User Context   08/26/2017 2216 08/30/2017 1604 Full Code [id number]  Nicholes Mango, MD Inpatient   01/08/2015 0307 01/09/2015 1336 Full Code [id number]  Theodoro Grist, MD Inpatient           Consults urology  DVT Prophylaxis  Lovenox   Lab Results  Component Value Date   PLT 227 09/16/2017     Time Spent in minutes   35 minutes  Greater than 50% of time spent in care coordination and counseling patient regarding the condition and plan of care.   Dustin Flock M.D on 09/17/2017 at 2:53 PM  Between 7am to 6pm - Pager - [phone]  After 6pm go to [email or web address] - Proofreader  [employer]   Office  [phone]

## 2017-09-17 NOTE — Evaluation (Signed)
Physical Therapy Evaluation Patient Details Name: Karen [name] MRN: [id number] DOB: 1946-01-02 Today's Date: 09/17/2017   History of Present Illness  72 y.o. female s/p cystoscopy with retrograde pyelogram/ureteral stent placement (R) 09/15/17 with  PMH COPD, chronic respiratory failure, chronic diastolic CHF, diabetes, hypertension    Clinical Impression  Patient alert and oriented up in chair at start of session, reports her R side pain is 8/10 which is slightly better compared to how it has been. Patient states that she lives in a one level home and was independent prior to admission. States she has been using a RW for about a year now. Patient able to perform bed mobility/transfers with supervision, and ambulated with CGA ~17ft with RW and O2 at 3L via [place]. Patient complains of R knee pain limiting more ambulation. SpO2 during ambulation was in mid 80s, patient instructed in a standing rest break to recover, no complaints of SOB/fatigue/lightheadness, minimal complaints of dizziness. Patient in bed at end of session spO2 >90%.The patient would benefit from further skilled PT to address decreased endurance, activity tolerance, education about energy conservation and gait and balance abnormalities.     Follow Up Recommendations Home health PT    Equipment Recommendations  None recommended by PT    Recommendations for Other Services       Precautions / Restrictions Precautions Precautions: Fall Restrictions Weight Bearing Restrictions: No      Mobility  Bed Mobility Overal bed mobility: Needs Assistance Bed Mobility: Sit to Supine       Sit to supine: Supervision      Transfers Overall transfer level: Needs assistance Equipment used: Rolling walker (2 wheeled) Transfers: Sit to/from Stand Sit to Stand: Supervision            Ambulation/Gait Ambulation/Gait assistance: Min guard Gait Distance (Feet): 80 Feet         General Gait Details: decreased speed,  patient complained of R knee pain with ambulation. SpO2 81% during ambulation, patient cued for standing rest break, recovered >88%.   Stairs            Wheelchair Mobility    Modified Rankin (Stroke Patients Only)       Balance                                             Pertinent Vitals/Pain Pain Assessment: 0-10 Pain Score: 8  Pain Location: R side Pain Descriptors / Indicators: Sharp Pain Intervention(s): Limited activity within patient's tolerance;Repositioned    Home Living Family/patient expects to be discharged to:: Private residence Living Arrangements: Spouse/significant other Available Help at Discharge: Family Type of Home: Mobile home Home Access: Stairs to enter Entrance Stairs-Rails: Can reach both Entrance Stairs-Number of Steps: 4-5 Home Layout: One level Home Equipment: Grab bars - toilet;Toilet riser;Walker - 2 wheels;Cane - single point      Prior Function Level of Independence: Independent with assistive device(s)         Comments: Patient reports using RW for about a year independently     Hand Dominance   Dominant Hand: Right    Extremity/Trunk Assessment   Upper Extremity Assessment Upper Extremity Assessment: Generalized weakness    Lower Extremity Assessment Lower Extremity Assessment: Generalized weakness;RLE deficits/detail;LLE deficits/detail RLE Deficits / Details: 3+/5 LLE Deficits / Details: 3+/5       Communication   Communication: No difficulties  Cognition Arousal/Alertness: Awake/alert Behavior During Therapy: WFL for tasks assessed/performed                                          General Comments      Exercises     Assessment/Plan    PT Assessment Patient needs continued PT services  PT Problem List Decreased strength;Pain;Decreased range of motion;Decreased activity tolerance;Decreased balance;Decreased mobility       PT Treatment Interventions DME  instruction;Therapeutic exercise;Balance training;Gait training;Stair training;Neuromuscular re-education;Therapeutic activities;Patient/family education    PT Goals (Current goals can be found in the Care Plan section)  Acute Rehab PT Goals Patient Stated Goal: Patient would like to return home when she feels better PT Goal Formulation: With patient Time For Goal Achievement: 10/01/17 Potential to Achieve Goals: Good    Frequency Min 2X/week   Barriers to discharge        Co-evaluation               AM-PAC PT "6 Clicks" Daily Activity  Outcome Measure Difficulty turning over in bed (including adjusting bedclothes, sheets and blankets)?: None Difficulty moving from lying on back to sitting on the side of the bed? : None Difficulty sitting down on and standing up from a chair with arms (e.g., wheelchair, bedside commode, etc,.)?: None Help needed moving to and from a bed to chair (including a wheelchair)?: None Help needed walking in hospital room?: A Little Help needed climbing 3-5 steps with a railing? : A Little 6 Click Score: 22    End of Session Equipment Utilized During Treatment: Gait belt;Oxygen;Other (comment)(3L) Activity Tolerance: Patient limited by pain Patient left: in bed;with call bell/phone within reach;with bed alarm set Nurse Communication: Mobility status PT Visit Diagnosis: Unsteadiness on feet (R26.81);Other abnormalities of gait and mobility (R26.89);Muscle weakness (generalized) (M62.81)    Time: 2993-7169 PT Time Calculation (min) (ACUTE ONLY): 22 min   Charges:   PT Evaluation $PT Eval Low Complexity: 1 Low     PT G Codes:        Lieutenant Diego PT, DPT 2:23 PM,09/17/17 [phone]

## 2017-09-18 ENCOUNTER — Telehealth: Payer: Self-pay | Admitting: Radiology

## 2017-09-18 LAB — CBC
HCT: 26.3 % — ABNORMAL LOW (ref 35.0–47.0)
Hemoglobin: 8.6 g/dL — ABNORMAL LOW (ref 12.0–16.0)
MCH: 30.5 pg (ref 26.0–34.0)
MCHC: 32.7 g/dL (ref 32.0–36.0)
MCV: 93.3 fL (ref 80.0–100.0)
PLATELETS: 235 10*3/uL (ref 150–440)
RBC: 2.82 MIL/uL — AB (ref 3.80–5.20)
RDW: 15.4 % — AB (ref 11.5–14.5)
WBC: 8.1 10*3/uL (ref 3.6–11.0)

## 2017-09-18 LAB — URINE CULTURE
Culture: >=100000 — AB
SPECIAL REQUESTS: NORMAL

## 2017-09-18 LAB — GLUCOSE, CAPILLARY
GLUCOSE-CAPILLARY: 174 mg/dL — AB (ref 70–99)
Glucose-Capillary: 298 mg/dL — ABNORMAL HIGH (ref 70–99)

## 2017-09-18 LAB — BASIC METABOLIC PANEL
Anion gap: 8 (ref 5–15)
BUN: 19 mg/dL (ref 8–23)
CALCIUM: 8.9 mg/dL (ref 8.9–10.3)
CO2: 34 mmol/L — ABNORMAL HIGH (ref 22–32)
CREATININE: 0.61 mg/dL (ref 0.44–1.00)
Chloride: 98 mmol/L (ref 98–111)
GFR calc Af Amer: >60 mL/min (ref >60)
GLUCOSE: 195 mg/dL — AB (ref 70–99)
POTASSIUM: 4.3 mmol/L (ref 3.5–5.1)
SODIUM: 140 mmol/L (ref 135–145)

## 2017-09-18 MED ORDER — LACTULOSE 10 GM/15ML PO SOLN
30.0000 g | Freq: Every day | ORAL | Status: DC
  Administered 2017-09-18: 30 g via ORAL
  Filled 2017-09-18: qty 60

## 2017-09-18 MED ORDER — CIPROFLOXACIN HCL 500 MG PO TABS: 500.0000 mg | ORAL_TABLET | Freq: Two times a day (BID) | ORAL | 0 refills | Status: AC

## 2017-09-18 MED ORDER — CIPROFLOXACIN HCL 500 MG PO TABS
500.0000 mg | ORAL_TABLET | Freq: Two times a day (BID) | ORAL | Status: DC
  Administered 2017-09-18: 500 mg via ORAL
  Filled 2017-09-18: qty 1

## 2017-09-18 MED ORDER — INSULIN PUMP: 1.0000 | Freq: Three times a day (TID) | SUBCUTANEOUS | Status: DC

## 2017-09-18 MED ORDER — DOCUSATE SODIUM 100 MG PO CAPS: 200.0000 mg | ORAL_CAPSULE | Freq: Two times a day (BID) | ORAL | 0 refills | Status: DC

## 2017-09-18 MED ORDER — DOCUSATE SODIUM 100 MG PO CAPS
200.0000 mg | ORAL_CAPSULE | Freq: Two times a day (BID) | ORAL | Status: DC
  Administered 2017-09-18: 200 mg via ORAL
  Filled 2017-09-18: qty 2

## 2017-09-18 MED ORDER — SENNA 8.6 MG PO TABS
2.0000 | ORAL_TABLET | Freq: Every day | ORAL | Status: DC
  Administered 2017-09-18: 17.2 mg via ORAL
  Filled 2017-09-18: qty 2

## 2017-09-18 NOTE — Progress Notes (Signed)
Physical Therapy Treatment Patient Details Name: Karen [name] MRN: [id number] DOB: 07/12/1945 Today's Date: 09/18/2017    History of Present Illness 71 y.o. female s/p cystoscopy with retrograde pyelogram/ureteral stent placement (R) 09/15/17 with  PMH COPD, chronic respiratory failure, chronic diastolic CHF, diabetes, hypertension      PT Comments    Patient up in chair at start of session with family at bedside, complains of R side pain 8/10. Patient able to transfer and ambulate with CGA and RW, ambulated ~249ft with RW and O2 via [place] at 3L. Improved tolerance/endurance compared to last session, with decreased speed and R knee pain. 1 standing rest break when spO2 86%, cues for breathing technique recovered to 89%. Patient in chair with all needs in reach with spO2 >90% and physician in room. The patient would benefit from further skilled PT to address limitations and return to PLOF.     Follow Up Recommendations  Home health PT     Equipment Recommendations  None recommended by PT    Recommendations for Other Services       Precautions / Restrictions Precautions Precautions: Fall Restrictions Weight Bearing Restrictions: No    Mobility  Bed Mobility               General bed mobility comments: deferred patient up in chair  Transfers Overall transfer level: Needs assistance Equipment used: Rolling walker (2 wheeled) Transfers: Sit to/from Stand Sit to Stand: Supervision            Ambulation/Gait Ambulation/Gait assistance: Min guard Gait Distance (Feet): 200 Feet Assistive device: Rolling walker (2 wheeled)       General Gait Details: decreased speed, improved endurance compared to last session. 1 standing rest break with spO2 at 86%, recovered to 89%.    Stairs             Wheelchair Mobility    Modified Rankin (Stroke Patients Only)       Balance                                            Cognition  Arousal/Alertness: Awake/alert Behavior During Therapy: WFL for tasks assessed/performed                                          Exercises      General Comments        Pertinent Vitals/Pain Pain Assessment: 0-10 Pain Score: 8  Pain Location: R side Pain Descriptors / Indicators: Sharp Pain Intervention(s): Limited activity within patient's tolerance;Monitored during session;Repositioned    Home Living                      Prior Function            PT Goals (current goals can now be found in the care plan section) Acute Rehab PT Goals Patient Stated Goal: Patient would like to return home when she feels better PT Goal Formulation: With patient Time For Goal Achievement: 10/01/17 Progress towards PT goals: Progressing toward goals    Frequency    Min 2X/week      PT Plan Current plan remains appropriate    Co-evaluation              AM-PAC PT "6  Clicks" Daily Activity  Outcome Measure  Difficulty turning over in bed (including adjusting bedclothes, sheets and blankets)?: None Difficulty moving from lying on back to sitting on the side of the bed? : None Difficulty sitting down on and standing up from a chair with arms (e.g., wheelchair, bedside commode, etc,.)?: None Help needed moving to and from a bed to chair (including a wheelchair)?: None Help needed walking in hospital room?: A Little Help needed climbing 3-5 steps with a railing? : A Little 6 Click Score: 22    End of Session Equipment Utilized During Treatment: Gait belt;Oxygen;Other (comment)(3L) Activity Tolerance: Patient tolerated treatment well Patient left: in chair;with call bell/phone within reach;with family/visitor present;Other (comment)(physician present) Nurse Communication: Mobility status PT Visit Diagnosis: Unsteadiness on feet (R26.81);Other abnormalities of gait and mobility (R26.89);Muscle weakness (generalized) (M62.81)     Time: 4784-1282 PT  Time Calculation (min) (ACUTE ONLY): 18 min  Charges:  $Gait Training: 8-22 mins                    G Codes:        Lieutenant Diego PT, DPT 12:36 PM,09/18/17 [phone]

## 2017-09-18 NOTE — Telephone Encounter (Signed)
-----   Message from Hollice Espy, MD sent at 09/18/2017  2:01 PM EDT ----- Yes, it looks like she has multiple medical issues and I do not know her at all.  Have her see someone ideally next week to get this scheduled.  Hollice Espy, MD  ----- Message ----- From: Ranell Patrick, RN Sent: 09/18/2017   1:56 PM To: Alessia Gonsalez Linton Flemings, RN, Hollice Espy, MD  Dr McKenzie's op note says to be scheduled for surgery in 2 weeks, the hospitalist has asked for an office visit in 2 weeks. Does she need to be seen prior to scheduling surgery?

## 2017-09-18 NOTE — Progress Notes (Signed)
Pt discharged per MD order. IV removed. Discharge instructions reviewed with pt. All questions answered to pt satisfaction. Pts own insulin returned to pt. Pt taken to car in wheelchair by staff wearing pts home oxygen.

## 2017-09-18 NOTE — Care Management Note (Signed)
Case Management Note  Patient Details  Name: Joey Hudock MRN: [id number] Date of Birth: 1945/11/13   Patient admitted with ureteral stone . Patient lives at home with husband.  PCP White.  Patient states she has a RW, cane, and chronic O2 through [place].  PT has assessed patient and recommends home health. Patient declines services at discharge.  Husband to transport.  Portable O2 in room.  RNCM signing off.   Subjective/Objective:                    Action/Plan:   Expected Discharge Date:  09/18/17               Expected Discharge Plan:     In-House Referral:     Discharge planning Services     Post Acute Care Choice:    Choice offered to:     DME Arranged:    DME Agency:     HH Arranged:    HH Agency:     Status of Service:     If discussed at [employer], dates discussed:    Additional Comments:  Beverly Sessions, RN 09/18/2017, 2:22 PM

## 2017-09-18 NOTE — Discharge Summary (Signed)
[facility] - [place] at [facility], 72 y.o., DOB 1945-08-28, MRN [id number]. 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 female with a known history of COPD, chronic respiratory failure, chronic diastolic CHF, diabetes, hypertension presents to the emergency room sent in from urgent care due to 9 mm right ureteropelvic junction stone with associated hydronephrosis and UTI.    Patient was seen by urology and taken to the operating room.  And had a stent placed.  She will need out feel patient follow-up for the ureteral stent.  Patient also noticed to have acute renal failure due to obstruction now that is resolved.  She is doing much better and is stable for discharge.           Consults  urology  Significant Tests:  See full reports for all details     Dg Abd 1 View  Result Date: 09/17/2017 CLINICAL DATA:  Stone removal with stent placement. EXAM: ABDOMEN - 1 VIEW COMPARISON:  09/15/2017. FINDINGS: There is been interval placement of a right-sided double-J nephroureteral stent. The proximal portion of the stent is in the projection of the expected location of the right renal pelvis. The distal portion is in the pelvis. Calcific density adjacent to the mid portion of the right stent measuring 7 mm is identified and may represent ureteral calculus. Previous cholecystectomy. IMPRESSION: 1. Interval placement of right-sided nephroureteral  stent. 2. Calcific density along the course of the right ureter measuring 7 mm may represent patient's ureteral calculus. Electronically Signed   By: Kerby Moors M.D.   On: 09/17/2017 11:30   Ct Renal Stone Study  Result Date: 09/15/2017 CLINICAL DATA:  RIGHT-sided abdominal pain. EXAM: CT ABDOMEN AND PELVIS WITHOUT CONTRAST TECHNIQUE: Multidetector CT imaging of the abdomen and pelvis was performed following the standard protocol without IV contrast. COMPARISON:  CT of the abdomen and pelvis on 08/27/2017 the FINDINGS: Lower chest: There is mitral calcification. Heart size appears normal. Lung bases are unremarkable. Hepatobiliary: Status post cholecystectomy.  No liver lesions. Pancreas: Unremarkable. No pancreatic ductal dilatation or surrounding inflammatory changes. Spleen: Normal in size without focal abnormality. Adrenals/Urinary Tract: Renal glands are normal. There is moderate RIGHT hydronephrosis secondary to a calculus at the ureteropelvic junction which measures 9 millimeters in diameter. There is RIGHT perinephric stranding. The distal RIGHT ureter is unremarkable. The LEFT kidney and ureter are unremarkable. Urinary bladder is normal in appearance. Stomach/Bowel: The stomach and small bowel loops are normal in appearance. There are numerous colonic diverticula but no associated inflammation. Appendix is not seen. Vascular/Lymphatic: There is atherosclerotic calcification of the abdominal aorta not associated with aneurysm. No abdominal adenopathy. There is subtle stranding in the mesenteric fat not associated significant adenopathy. Reproductive: Uterus is present.  No adnexal mass. Other: No free pelvic fluid. There is anterior abdominal wall laxity. No hernia. Musculoskeletal: There is grade 1 anterolisthesis of L4 on L5, measuring 7 millimeters. Degenerative changes are seen in the LOWER thoracic and lumbar spine. IMPRESSION: 1. 9 millimeter obstructing stone at the RIGHT ureteropelvic  junction with associated hydronephrosis and perinephric stranding. 2. Mitral calcifications. 3. Cholecystectomy. 4. Significant colonic diverticulosis. 5.  Aortic atherosclerosis.  (ICD10-I70.0) 6. Spondylosis.  Grade 1 anterolisthesis of L4 on L5. Electronically Signed   By: Nolon Nations M.D.   On: 09/15/2017 11:54   US Pelvic Complete With Transvaginal  Result Date: 08/21/2017 CLINICAL DATA:  Clinical findings of uterine masses. History of incontinence and urinary urgency. Three previous cesarean sections. EXAM: TRANSABDOMINAL AND TRANSVAGINAL ULTRASOUND OF PELVIS TECHNIQUE: Both transabdominal and transvaginal ultrasound examinations of the pelvis were performed. Transabdominal technique was performed for global imaging of the pelvis including uterus, ovaries, adnexal regions, and pelvic cul-de-sac. It was necessary to proceed with endovaginal exam following the transabdominal exam to visualize the uterus, endometrium, ovaries, and adnexal regions. COMPARISON:  Abdominopelvic CT scan of April 02, 2014 FINDINGS: The transabdominal images are somewhat limited due to the fact patient's urinary bladder was not full. Uterus Measurements: 5.2 x 2.7 x 3.3 cm. The myometrial echotexture is heterogeneous. No discrete fibroids or other masses are observed. Endometrium Thickness: 3.1 mm.  No focal abnormality visualized. Right ovary The right ovary could not be visualized. No adnexal masses were observed. Left ovary The left ovary could not be visualized. No adnexal masses were observed. Other findings No abnormal free fluid. IMPRESSION: Normal sized uterus with heterogeneous echotexture. No discrete fibroids are observed. Normal appearing endometrium. Nonvisualization of the ovaries which is not unusual at the patient's age. Normal appearing adnexal structures. Electronically Signed   By: David  Martinique M.D.   On: 08/21/2017 15:06   Ct Angio Abd/pel W/ And/or W/o  Result Date: 08/27/2017 CLINICAL DATA:  rectal  bleeding. The symptoms started this afternoon. Patient states she had 3 episodes at home. Was bright red. She states it would fill the toilet. This was accompanied by some lower abdominal pain and discomfort. While here in the emergency department she had another bloody bowel movement. She denies similar symptoms in the past. Denies history of hemorrhoids or straining. Denies having undergone colonoscopy in the past. Denies any history of intestinal disorder for herself or in her family. EXAM: CTA ABDOMEN AND PELVIS wITHOUT AND WITH CONTRAST TECHNIQUE: Multidetector CT imaging of the abdomen and pelvis was performed using the standard protocol before and during bolus administration of intravenous contrast. Multiplanar reconstructed images and MIPs were obtained and reviewed to evaluate the vascular anatomy. CONTRAST:  186m ISOVUE-370 IOPAMIDOL (ISOVUE-370) INJECTION 76% COMPARISON:  04/02/2014 FINDINGS: VASCULAR Aorta: Scattered atheromatous calcified plaque in the infrarenal segment. No aneurysm, dissection, or stenosis. Celiac: Patent without evidence of aneurysm, dissection, vasculitis or significant stenosis. SMA: Patent without evidence of aneurysm, dissection, vasculitis or significant stenosis. Minimal eccentric nonocclusive calcified plaque. Renals: Both renal arteries are patent without evidence of aneurysm, dissection, vasculitis, fibromuscular dysplasia or significant stenosis. IMA: Patent without evidence of aneurysm, dissection, vasculitis or significant stenosis. Inflow: Patent without evidence of aneurysm, dissection, vasculitis or significant stenosis. Mild scattered calcified plaque. Proximal Outflow: Bilateral common femoral and visualized portions of the superficial and profunda femoral arteries are patent without evidence of aneurysm, dissection, vasculitis or significant stenosis. Veins: Patent hepatic veins, portal vein, SMV, splenic vein, bilateral renal veins, IVC, iliac venous system.  Bilateral pelvic phleboliths. Review of the MIP images confirms the above findings. NON-VASCULAR Lower chest: No acute abnormality. Hepatobiliary: No focal liver abnormality is seen. Status post cholecystectomy. No biliary dilatation. Pancreas: Mild diffuse atrophy.  No mass or ductal dilatation. Spleen: Normal in size without focal abnormality. Adrenals/Urinary Tract: Normal  adrenal glands. 9 mm calculus in the lower pole right renal collecting system. No hydronephrosis. No focal renal lesion. Ureters decompressed. Urinary bladder incompletely distended. Stomach/Bowel: No evidence of extravasation. Stomach and small bowel are decompressed. Appendix not identified. No pericecal inflammatory/edematous change. Colon is nondilated. Ascending, descending, and sigmoid colon diverticula without significant adjacent inflammatory/edematous change or abscess. Lymphatic: Hyperdense normal-sized bilateral common iliac, right external iliac, and bilateral inguinal lymph nodes, stable. No abdominal adenopathy. Reproductive: Uterus and bilateral adnexa are unremarkable. Other: No ascites.  No free air. Musculoskeletal: Facet DJD in the lower lumbar spine probably accounting for the grade 1 anterolisthesis L4-5, stable since prior study. Negative for fracture or worrisome bone lesion. IMPRESSION: VASCULAR 1. Negative for active extravasation or vascular lesion to suggest an etiology of GI bleeding. 2.  Aortoiliac atherosclerosis (ICD10-170.0) NON-VASCULAR 1. No acute findings. 2. Extensive colonic diverticulosis. 3. Right nonobstructive nephrolithiasis. Electronically Signed   By: Lucrezia Europe M.D.   On: 08/27/2017 11:00       Today   Subjective:   Karen [name] patient doing much better was constipated Objective:   Blood pressure (!) 158/53, pulse 87, temperature (!) 97.4 F (36.3 C), temperature source Oral, resp. rate 20, height '5\' 3"'  (1.6 m), weight 101.2 kg (223 lb), SpO2 95 %.  .  Intake/Output Summary (Last 24  hours) at 09/18/2017 1438 Last data filed at 09/18/2017 1012 Gross per 24 hour  Intake 938 ml  Output 2700 ml  Net -1762 ml    Exam VITAL SIGNS: Blood pressure (!) 158/53, pulse 87, temperature (!) 97.4 F (36.3 C), temperature source Oral, resp. rate 20, height '5\' 3"'  (1.6 m), weight 101.2 kg (223 lb), SpO2 95 %.  GENERAL:  72 y.o.-year-old patient lying in the bed with no acute distress.  EYES: Pupils equal, round, reactive to light and accommodation. No scleral icterus. Extraocular muscles intact.  HEENT: Head atraumatic, normocephalic. Oropharynx and nasopharynx clear.  NECK:  Supple, no jugular venous distention. No thyroid enlargement, no tenderness.  LUNGS: Normal breath sounds bilaterally, no wheezing, rales,rhonchi or crepitation. No use of accessory muscles of respiration.  CARDIOVASCULAR: S1, S2 normal. No murmurs, rubs, or gallops.  ABDOMEN: Soft, nontender, nondistended. Bowel sounds present. No organomegaly or mass.  EXTREMITIES: No pedal edema, cyanosis, or clubbing.  NEUROLOGIC: Cranial nerves II through XII are intact. Muscle strength 5/5 in all extremities. Sensation intact. Gait not checked.  PSYCHIATRIC: The patient is alert and oriented x 3.  SKIN: No obvious rash, lesion, or ulcer.   Data Review     CBC w Diff:  Lab Results  Component Value Date   WBC 8.1 09/18/2017   HGB 8.6 (L) 09/18/2017   HGB 10.8 (L) 04/04/2014   HCT 26.3 (L) 09/18/2017   HCT 33.6 (L) 04/04/2014   PLT 235 09/18/2017   PLT 160 04/04/2014   LYMPHOPCT 16 09/11/2017   LYMPHOPCT 7.3 04/04/2014   MONOPCT 6 09/11/2017   MONOPCT 7.5 04/04/2014   EOSPCT 4 09/11/2017   EOSPCT 1.2 04/04/2014   BASOPCT 1 09/11/2017   BASOPCT 0.3 04/04/2014   CMP:  Lab Results  Component Value Date   NA 140 09/18/2017   NA 136 06/18/2014   K 4.3 09/18/2017   K 4.5 06/18/2014   CL 98 09/18/2017   CL 96 (L) 06/18/2014   CO2 34 (H) 09/18/2017   CO2 32 06/18/2014   BUN 19 09/18/2017   BUN 22 (H)  06/18/2014   CREATININE 0.61 09/18/2017   CREATININE 0.78  06/18/2014   PROT 6.6 09/11/2017   PROT 6.3 (L) 04/02/2014   ALBUMIN 3.6 09/11/2017   ALBUMIN 3.0 (L) 04/02/2014   BILITOT 0.7 09/11/2017   BILITOT 1.0 04/02/2014   ALKPHOS 52 09/11/2017   ALKPHOS 77 04/02/2014   AST 17 09/11/2017   AST 49 (H) 04/02/2014   ALT 14 09/11/2017   ALT 20 04/02/2014  .  Micro Results Recent Results (from the past 240 hour(s))  Urine Culture     Status: Abnormal   Collection Time: 09/15/17  5:19 PM  Result Value Ref Range Status   Specimen Description   Final    URINE, CLEAN CATCH Performed at [facility], [address] [place], [place] [address]    Special Requests   Final    Normal Performed at [facility], [place],  [address]    Culture >=100,000 COLONIES/mL ESCHERICHIA COLI (A)  Final   Report Status 09/18/2017 FINAL  Final   Organism ID, Bacteria ESCHERICHIA COLI (A)  Final      Susceptibility   Escherichia coli - MIC*    AMPICILLIN >=32 RESISTANT Resistant     CEFAZOLIN <=4 SENSITIVE Sensitive     CEFTRIAXONE <=1 SENSITIVE Sensitive     CIPROFLOXACIN <=0.25 SENSITIVE Sensitive     GENTAMICIN <=1 SENSITIVE Sensitive     IMIPENEM <=0.25 SENSITIVE Sensitive     NITROFURANTOIN <=16 SENSITIVE Sensitive     TRIMETH/SULFA >=320 RESISTANT Resistant     AMPICILLIN/SULBACTAM 16 INTERMEDIATE Intermediate     PIP/TAZO <=4 SENSITIVE Sensitive     Extended ESBL NEGATIVE Sensitive     * >=100,000 COLONIES/mL ESCHERICHIA COLI        Code Status Orders  (From admission, onward)        Start     Ordered   09/15/17 1747  Do not attempt resuscitation (DNR)  Continuous    Question Answer Comment  In the event of cardiac or respiratory ARREST Do not call a "code blue"   In the event of cardiac or respiratory ARREST Do not perform Intubation, CPR, defibrillation or ACLS   In the event of cardiac or respiratory ARREST Use medication by any  route, position, wound care, and other measures to relive pain and suffering. May use oxygen, suction and manual treatment of airway obstruction as needed for comfort.      09/15/17 1746    Code Status History    Date Active Date Inactive Code Status Order ID Comments User Context   08/26/2017 2216 08/30/2017 1604 Full Code [id number]  Nicholes Mango, MD Inpatient   01/08/2015 0307 01/09/2015 1336 Full Code [id number]  Theodoro Grist, MD Inpatient          Follow-up Information    Ricardo Jericho, NP. Go on 09/24/2017.   Specialty:  Family Medicine Why:  Monday July 22nd at 11am please arrive 15 mins early  Contact information: [place] [address] [phone]        Hollice Espy, MD In 2 weeks.   Specialty:  Urology Why:  Dr. office will call with appointment time and date  for rental stent and stone follow-up Contact information: [place] [address] [phone]           Discharge Medications   Allergies as of 09/18/2017      Reactions   Ace Inhibitors    Gabapentin Hives   Lisinopril    Lyrica [pregabalin]  Medication List    STOP taking these medications   hydrochlorothiazide 25 MG tablet Commonly known as:  HYDRODIURIL     TAKE these medications   atorvastatin 10 MG tablet Commonly known as:  LIPITOR Take 1 tablet by mouth daily.   BREO ELLIPTA 100-25 MCG/INH Aepb Generic drug:  fluticasone furoate-vilanterol Inhale 1 puff into the lungs daily.   ciprofloxacin 500 MG tablet Commonly known as:  CIPRO Take 1 tablet (500 mg total) by mouth 2 (two) times daily for 5 days.   conjugated estrogens vaginal cream Commonly known as:  PREMARIN Apply 0.28m (pea-sized amount)  just inside the vaginal introitus with a finger-tip on  Monday, Wednesday and Friday nights.   docusate sodium 100 MG capsule Commonly known as:  COLACE Take 2 capsules (200 mg total) by mouth 2 (two) times daily.    estradiol 0.1 MG/GM vaginal cream Commonly known as:  ESTRACE VAGINAL Apply 0.527m(pea-sized amount)  just inside the vaginal introitus with a finger-tip on Monday, Wednesday and Friday nights.   HUMALOG 100 UNIT/ML injection Generic drug:  insulin lispro Inject 76 Units into the skin daily. In VGO device   insulin pump Soln Inject 1 each into the skin 3 times daily with meals, bedtime and 2 AM.   INVOKANA 100 MG Tabs tablet Generic drug:  canagliflozin Take 1 tablet by mouth daily.   Iron 325 (65 Fe) MG Tabs Take 1 tablet by mouth daily.   losartan 100 MG tablet Commonly known as:  COZAAR Take 100 mg by mouth daily.   metFORMIN 500 MG tablet Commonly known as:  GLUCOPHAGE Take 1,000 mg by mouth 2 (two) times daily with a meal.   mirabegron ER 25 MG Tb24 tablet Commonly known as:  MYRBETRIQ Take 1 tablet (25 mg total) by mouth daily.   montelukast 10 MG tablet Commonly known as:  SINGULAIR Take 10 mg by mouth at bedtime.   OXYGEN Place 3 L/min into the nose.   V-GO 40 Kit Inject into the skin as directed.            Durable Medical Equipment  (From admission, onward)        Start     Ordered   09/18/17 1336  DME Oxygen  Once    Question Answer Comment  Mode or (Route) Nasal cannula   Liters per Minute 3   Oxygen delivery system Gas      09/18/17 1335         Total Time in preparing paper work, data evaluation and todays exam - 3531inutes  ShDustin [email or web address].D on 09/18/2017 at 2:38 PM [phone]

## 2017-09-18 NOTE — Care Management Important Message (Signed)
Copy of signed IM left with patient in room.  

## 2017-09-18 NOTE — Telephone Encounter (Signed)
Notified unit secretary of office visit scheduled with Dr Bernardo Heater on 09/28/2017.

## 2017-09-24 ENCOUNTER — Telehealth: Payer: Self-pay

## 2017-09-24 NOTE — Telephone Encounter (Signed)
EMMI Follow-up: Received a message from [name] as she had received a automated call from my number.  I called [name] back  but got her voicemail and explained about out 2 automated calls and to call me back if she had any concerns.

## 2017-09-28 ENCOUNTER — Ambulatory Visit (INDEPENDENT_AMBULATORY_CARE_PROVIDER_SITE_OTHER): Payer: Medicare Other | Admitting: Urology

## 2017-09-28 ENCOUNTER — Encounter: Payer: Self-pay | Admitting: Urology

## 2017-09-28 ENCOUNTER — Other Ambulatory Visit: Payer: Self-pay | Admitting: Radiology

## 2017-09-28 VITALS — BP 140/80 | HR 66 | Resp 16 | Ht 63.0 in | Wt 220.0 lb

## 2017-09-28 DIAGNOSIS — N201 Calculus of ureter: Secondary | ICD-10-CM

## 2017-09-28 DIAGNOSIS — N200 Calculus of kidney: Secondary | ICD-10-CM | POA: Diagnosis not present

## 2017-09-28 LAB — URINALYSIS, COMPLETE
BILIRUBIN UA: NEGATIVE
Ketones, UA: NEGATIVE
Nitrite, UA: NEGATIVE
PH UA: 7 (ref 5.0–7.5)
Protein, UA: NEGATIVE
Specific Gravity, UA: 1.015 (ref 1.005–1.030)
Urobilinogen, Ur: 0.2 mg/dL (ref 0.2–1.0)

## 2017-09-28 LAB — MICROSCOPIC EXAMINATION

## 2017-09-28 NOTE — H&P (View-Only) (Signed)
09/28/2017 1:49 PM   Theo Dills 1945-03-28 [id number]  Referring provider: Ricardo Jericho, NP [address] [place], [place] [address]  Chief Complaint  Patient presents with  . hospital folllow-up    HPI: 72 year old female presents for recent hospital follow-up.  She was admitted to [facility] on 09/15/2017 with flank pain and was found to have a 9 mm right proximal ureteral calculus with infection.  She underwent urgent stent placement by Dr. Alyson Ingles.  Urine culture did grow E. coli.  She is presently asymptomatic and is tolerating her stent fairly well.  She denies prior history of stone disease. Her CT showed no additional calculi.  She presents today for discussion of definitive stone management.  PMH: Past Medical History:  Diagnosis Date  . Acute on chronic respiratory failure with hypoxia and hypercapnia ([place]) 01/07/2015  . Anemia   . 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 Cataract   . CHF (congestive heart failure) ([place])   . Chronic kidney disease 08/10/2017  . CKD (chronic kidney disease)   . COPD (chronic obstructive pulmonary disease) ([place])   . Diabetes mellitus without complication ([place])   . Edema, peripheral 04/20/2014  . Hypertension   . Iron deficiency anemia 06/22/2014  . Leucocytosis 10/19/2015  . Primary osteoarthritis of right knee 09/01/2016  . Renal insufficiency   . Sciatica 01/07/2015    Surgical History: Past Surgical History:  Procedure Laterality Date  . CESAREAN SECTION    . CHOLECYSTECTOMY    . COLONOSCOPY WITH PROPOFOL N/A 08/28/2017   Procedure: COLONOSCOPY WITH PROPOFOL;  Surgeon: Lucilla Lame, MD;  Location: [facility] ENDOSCOPY;  Service: Endoscopy;  Laterality: N/A;  . COLONOSCOPY WITH PROPOFOL N/A 08/29/2017   Procedure: COLONOSCOPY WITH PROPOFOL;  Surgeon: Lucilla Lame, MD;  Location: [facility] ENDOSCOPY;  Service: Endoscopy;  Laterality: N/A;  . CYSTOSCOPY W/ URETERAL STENT PLACEMENT Right 09/15/2017   Procedure: CYSTOSCOPY WITH RETROGRADE  PYELOGRAM/URETERAL STENT PLACEMENT;  Surgeon: Cleon Gustin, MD;  Location: [facility] ORS;  Service: Urology;  Laterality: Right;  . EYE SURGERY      Home Medications:  Allergies as of 09/28/2017      Reactions   Ace Inhibitors    Gabapentin Hives   Lisinopril    Lyrica [pregabalin]       Medication List        Accurate as of 09/28/17  1:49 PM. Always use your most recent med list.          atorvastatin 10 MG tablet Commonly known as:  LIPITOR Take 1 tablet by mouth daily.   BREO ELLIPTA 100-25 MCG/INH Aepb Generic drug:  fluticasone furoate-vilanterol Inhale 1 puff into the lungs daily.   docusate sodium 100 MG capsule Commonly known as:  COLACE Take 2 capsules (200 mg total) by mouth 2 (two) times daily.   estradiol 0.1 MG/GM vaginal cream Commonly known as:  ESTRACE VAGINAL Apply 0.33m (pea-sized amount)  just inside the vaginal introitus with a finger-tip on Monday, Wednesday and Friday nights.   HUMALOG 100 UNIT/ML injection Generic drug:  insulin lispro Inject 76 Units into the skin daily. In VGO device   insulin pump Soln Inject 1 each into the skin 3 times daily with meals, bedtime and 2 AM.   INVOKANA 100 MG Tabs tablet Generic drug:  canagliflozin Take 1 tablet by mouth daily.   Iron 325 (65 Fe) MG Tabs Take 1 tablet by mouth daily.   losartan 100 MG tablet Commonly known as:  COZAAR Take 100 mg by mouth  daily.   metFORMIN 500 MG tablet Commonly known as:  GLUCOPHAGE Take 1,000 mg by mouth 2 (two) times daily with a meal.   mirabegron ER 25 MG Tb24 tablet Commonly known as:  MYRBETRIQ Take 1 tablet (25 mg total) by mouth daily.   montelukast 10 MG tablet Commonly known as:  SINGULAIR Take 10 mg by mouth at bedtime.   OXYGEN Place 3 L/min into the nose.   V-GO 40 Kit Inject into the skin as directed.       Allergies:  Allergies  Allergen Reactions  . Ace Inhibitors   . Gabapentin Hives  . Lisinopril   . Lyrica [Pregabalin]      Family History: Family History  Family history unknown: Yes    Social History:  reports that she has quit smoking. She has never used smokeless tobacco. She reports that she does not drink alcohol or use drugs.  ROS: UROLOGY Frequent Urination?: Yes Hard to postpone urination?: Yes Burning/pain with urination?: No Get up at night to urinate?: No Leakage of urine?: Yes Urine stream starts and stops?: No Trouble starting stream?: No Do you have to strain to urinate?: No Blood in urine?: No Urinary tract infection?: No Sexually transmitted disease?: No Injury to kidneys or bladder?: No Painful intercourse?: No Weak stream?: No Currently pregnant?: No Vaginal bleeding?: No  Gastrointestinal Nausea?: No Vomiting?: No Indigestion/heartburn?: No Diarrhea?: No Constipation?: No  Constitutional Fever: No Night sweats?: No Weight loss?: No Fatigue?: No  Skin Skin rash/lesions?: No Itching?: No  Eyes Blurred vision?: No Double vision?: No  Ears/Nose/Throat Sore throat?: No Sinus problems?: No  Hematologic/Lymphatic Swollen glands?: No Easy bruising?: No  Cardiovascular Leg swelling?: No Chest pain?: No  Respiratory Cough?: No Shortness of breath?: No  Endocrine Excessive thirst?: No  Musculoskeletal Back pain?: No Joint pain?: No  Neurological Headaches?: No Dizziness?: No  Psychologic Depression?: No Anxiety?: No  Physical Exam: BP 140/80   Pulse 66   Resp 16   Ht '5\' 3"'  (1.6 m)   Wt 220 lb (99.8 kg)   BMI 38.97 kg/m   Constitutional:  Alert and oriented, No acute distress. HEENT: [place] AT, moist mucus membranes.  Trachea midline, no masses. Cardiovascular: No clubbing, cyanosis, or edema.  RRR Respiratory: Normal respiratory effort, no increased work of breathing.  Lungs clear GI: Abdomen is soft, nontender, nondistended, no abdominal masses GU: No CVA tenderness Lymph: No cervical or inguinal lymphadenopathy. Skin: No rashes,  bruises or suspicious lesions. Neurologic: Grossly intact, no focal deficits, moving all 4 extremities. Psychiatric: Normal mood and affect.  Urinalysis Dipstick 1+ leukocytes 2+ blood/microscopy 11-30 WBC, 3-10 RBC.  Pertinent Imaging: KUB and CT personally reviewed Results for orders placed during the hospital encounter of 09/15/17  DG Abd 1 View   Narrative CLINICAL DATA:  Stone removal with stent placement.  EXAM: ABDOMEN - 1 VIEW  COMPARISON:  09/15/2017.  FINDINGS: There is been interval placement of a right-sided double-J nephroureteral stent. The proximal portion of the stent is in the projection of the expected location of the right renal pelvis. The distal portion is in the pelvis. Calcific density adjacent to the mid portion of the right stent measuring 7 mm is identified and may represent ureteral calculus.  Previous cholecystectomy.  IMPRESSION: 1. Interval placement of right-sided nephroureteral stent. 2. Calcific density along the course of the right ureter measuring 7 mm may represent patient's ureteral calculus.   Electronically Signed   By: Kerby Moors M.D.   On:  09/17/2017 11:30    Results for orders placed during the hospital encounter of 09/15/17  CT Renal Stone Study   Narrative CLINICAL DATA:  RIGHT-sided abdominal pain.  EXAM: CT ABDOMEN AND PELVIS WITHOUT CONTRAST  TECHNIQUE: Multidetector CT imaging of the abdomen and pelvis was performed following the standard protocol without IV contrast.  COMPARISON:  CT of the abdomen and pelvis on 08/27/2017 the  FINDINGS: Lower chest: There is mitral calcification. Heart size appears normal. Lung bases are unremarkable.  Hepatobiliary: Status post cholecystectomy.  No liver lesions.  Pancreas: Unremarkable. No pancreatic ductal dilatation or surrounding inflammatory changes.  Spleen: Normal in size without focal abnormality.  Adrenals/Urinary Tract: Renal glands are normal. There is  moderate RIGHT hydronephrosis secondary to a calculus at the ureteropelvic junction which measures 9 millimeters in diameter. There is RIGHT perinephric stranding. The distal RIGHT ureter is unremarkable. The LEFT kidney and ureter are unremarkable. Urinary bladder is normal in appearance.  Stomach/Bowel: The stomach and small bowel loops are normal in appearance. There are numerous colonic diverticula but no associated inflammation. Appendix is not seen.  Vascular/Lymphatic: There is atherosclerotic calcification of the abdominal aorta not associated with aneurysm. No abdominal adenopathy. There is subtle stranding in the mesenteric fat not associated significant adenopathy.  Reproductive: Uterus is present.  No adnexal mass.  Other: No free pelvic fluid. There is anterior abdominal wall laxity. No hernia.  Musculoskeletal: There is grade 1 anterolisthesis of L4 on L5, measuring 7 millimeters. Degenerative changes are seen in the LOWER thoracic and lumbar spine.  IMPRESSION: 1. 9 millimeter obstructing stone at the RIGHT ureteropelvic junction with associated hydronephrosis and perinephric stranding. 2. Mitral calcifications. 3. Cholecystectomy. 4. Significant colonic diverticulosis. 5.  Aortic atherosclerosis.  (ICD10-I70.0) 6. Spondylosis.  Grade 1 anterolisthesis of L4 on L5.   Electronically Signed   By: Nolon Nations M.D.   On: 09/15/2017 11:54     Assessment & Plan:   72 year old female status post right ureteral stent placement for a 9 mm obstructing calculus with infection.  Management options for her stone were discussed including ureteroscopic removal and shockwave lithotripsy.  She was informed the most successful procedure would be ureteroscopic removal.  The advantages of shockwave lithotripsy be noninvasive and performed under conscious sedation were reviewed however the success rate is approximately 80-85%.  She would like to schedule ureteroscopic  removal.  The indications and nature of the planned procedure were discussed as well as the potential  benefits and expected outcome.  Alternatives have been discussed in detail. The most common complications and side effects were discussed including but not limited to infection/sepsis; blood loss; damage to urethra, bladder, ureter, kidney; need for multiple surgeries; need for prolonged stent placement as well as general anesthesia risks. Although uncommon she was also informed of the possibility that the calculus may not be able to be treated due to inability to obtain access to the upper ureter. In that event she would require stent placement and a follow-up procedure after a period of stent dilation. All of her questions were answered and she desires to proceed.  At the time of our visit she did not appear to be distracted or in pain.     Abbie Sons, [place] [address], [place], [place] [address] [phone]

## 2017-09-28 NOTE — Progress Notes (Signed)
09/28/2017 1:49 PM   Karen [name] 1945/05/15 [id number]  Referring provider: Ricardo Jericho, NP [address] [place], [place] [address]  Chief Complaint  Patient presents with  . hospital folllow-up    HPI: 72 year old female presents for recent hospital follow-up.  She was admitted to [facility] on 09/15/2017 with flank pain and was found to have a 9 mm right proximal ureteral calculus with infection.  She underwent urgent stent placement by Dr. Alyson Ingles.  Urine culture did grow E. coli.  She is presently asymptomatic and is tolerating her stent fairly well.  She denies prior history of stone disease. Her CT showed no additional calculi.  She presents today for discussion of definitive stone management.  PMH: Past Medical History:  Diagnosis Date  . Acute on chronic respiratory failure with hypoxia and hypercapnia ([place]) 01/07/2015  . Anemia   . 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 Cataract   . CHF (congestive heart failure) ([place])   . Chronic kidney disease 08/10/2017  . CKD (chronic kidney disease)   . COPD (chronic obstructive pulmonary disease) ([place])   . Diabetes mellitus without complication ([place])   . Edema, peripheral 04/20/2014  . Hypertension   . Iron deficiency anemia 06/22/2014  . Leucocytosis 10/19/2015  . Primary osteoarthritis of right knee 09/01/2016  . Renal insufficiency   . Sciatica 01/07/2015    Surgical History: Past Surgical History:  Procedure Laterality Date  . CESAREAN SECTION    . CHOLECYSTECTOMY    . COLONOSCOPY WITH PROPOFOL N/A 08/28/2017   Procedure: COLONOSCOPY WITH PROPOFOL;  Surgeon: Lucilla Lame, MD;  Location: [facility] ENDOSCOPY;  Service: Endoscopy;  Laterality: N/A;  . COLONOSCOPY WITH PROPOFOL N/A 08/29/2017   Procedure: COLONOSCOPY WITH PROPOFOL;  Surgeon: Lucilla Lame, MD;  Location: [facility] ENDOSCOPY;  Service: Endoscopy;  Laterality: N/A;  . CYSTOSCOPY W/ URETERAL STENT PLACEMENT Right 09/15/2017   Procedure: CYSTOSCOPY WITH RETROGRADE  PYELOGRAM/URETERAL STENT PLACEMENT;  Surgeon: Cleon Gustin, MD;  Location: [facility] ORS;  Service: Urology;  Laterality: Right;  . EYE SURGERY      Home Medications:  Allergies as of 09/28/2017      Reactions   Ace Inhibitors    Gabapentin Hives   Lisinopril    Lyrica [pregabalin]       Medication List        Accurate as of 09/28/17  1:49 PM. Always use your most recent med list.          atorvastatin 10 MG tablet Commonly known as:  LIPITOR Take 1 tablet by mouth daily.   BREO ELLIPTA 100-25 MCG/INH Aepb Generic drug:  fluticasone furoate-vilanterol Inhale 1 puff into the lungs daily.   docusate sodium 100 MG capsule Commonly known as:  COLACE Take 2 capsules (200 mg total) by mouth 2 (two) times daily.   estradiol 0.1 MG/GM vaginal cream Commonly known as:  ESTRACE VAGINAL Apply 0.75m (pea-sized amount)  just inside the vaginal introitus with a finger-tip on Monday, Wednesday and Friday nights.   HUMALOG 100 UNIT/ML injection Generic drug:  insulin lispro Inject 76 Units into the skin daily. In VGO device   insulin pump Soln Inject 1 each into the skin 3 times daily with meals, bedtime and 2 AM.   INVOKANA 100 MG Tabs tablet Generic drug:  canagliflozin Take 1 tablet by mouth daily.   Iron 325 (65 Fe) MG Tabs Take 1 tablet by mouth daily.   losartan 100 MG tablet Commonly known as:  COZAAR Take 100 mg by mouth  daily.   metFORMIN 500 MG tablet Commonly known as:  GLUCOPHAGE Take 1,000 mg by mouth 2 (two) times daily with a meal.   mirabegron ER 25 MG Tb24 tablet Commonly known as:  MYRBETRIQ Take 1 tablet (25 mg total) by mouth daily.   montelukast 10 MG tablet Commonly known as:  SINGULAIR Take 10 mg by mouth at bedtime.   OXYGEN Place 3 L/min into the nose.   V-GO 40 Kit Inject into the skin as directed.       Allergies:  Allergies  Allergen Reactions  . Ace Inhibitors   . Gabapentin Hives  . Lisinopril   . Lyrica [Pregabalin]      Family History: Family History  Family history unknown: Yes    Social History:  reports that she has quit smoking. She has never used smokeless tobacco. She reports that she does not drink alcohol or use drugs.  ROS: UROLOGY Frequent Urination?: Yes Hard to postpone urination?: Yes Burning/pain with urination?: No Get up at night to urinate?: No Leakage of urine?: Yes Urine stream starts and stops?: No Trouble starting stream?: No Do you have to strain to urinate?: No Blood in urine?: No Urinary tract infection?: No Sexually transmitted disease?: No Injury to kidneys or bladder?: No Painful intercourse?: No Weak stream?: No Currently pregnant?: No Vaginal bleeding?: No  Gastrointestinal Nausea?: No Vomiting?: No Indigestion/heartburn?: No Diarrhea?: No Constipation?: No  Constitutional Fever: No Night sweats?: No Weight loss?: No Fatigue?: No  Skin Skin rash/lesions?: No Itching?: No  Eyes Blurred vision?: No Double vision?: No  Ears/Nose/Throat Sore throat?: No Sinus problems?: No  Hematologic/Lymphatic Swollen glands?: No Easy bruising?: No  Cardiovascular Leg swelling?: No Chest pain?: No  Respiratory Cough?: No Shortness of breath?: No  Endocrine Excessive thirst?: No  Musculoskeletal Back pain?: No Joint pain?: No  Neurological Headaches?: No Dizziness?: No  Psychologic Depression?: No Anxiety?: No  Physical Exam: BP 140/80   Pulse 66   Resp 16   Ht '5\' 3"'  (1.6 m)   Wt 220 lb (99.8 kg)   BMI 38.97 kg/m   Constitutional:  Alert and oriented, No acute distress. HEENT: [place] AT, moist mucus membranes.  Trachea midline, no masses. Cardiovascular: No clubbing, cyanosis, or edema.  RRR Respiratory: Normal respiratory effort, no increased work of breathing.  Lungs clear GI: Abdomen is soft, nontender, nondistended, no abdominal masses GU: No CVA tenderness Lymph: No cervical or inguinal lymphadenopathy. Skin: No rashes,  bruises or suspicious lesions. Neurologic: Grossly intact, no focal deficits, moving all 4 extremities. Psychiatric: Normal mood and affect.  Urinalysis Dipstick 1+ leukocytes 2+ blood/microscopy 11-30 WBC, 3-10 RBC.  Pertinent Imaging: KUB and CT personally reviewed Results for orders placed during the hospital encounter of 09/15/17  DG Abd 1 View   Narrative CLINICAL DATA:  Stone removal with stent placement.  EXAM: ABDOMEN - 1 VIEW  COMPARISON:  09/15/2017.  FINDINGS: There is been interval placement of a right-sided double-J nephroureteral stent. The proximal portion of the stent is in the projection of the expected location of the right renal pelvis. The distal portion is in the pelvis. Calcific density adjacent to the mid portion of the right stent measuring 7 mm is identified and may represent ureteral calculus.  Previous cholecystectomy.  IMPRESSION: 1. Interval placement of right-sided nephroureteral stent. 2. Calcific density along the course of the right ureter measuring 7 mm may represent patient's ureteral calculus.   Electronically Signed   By: Kerby Moors M.D.   On:  09/17/2017 11:30    Results for orders placed during the hospital encounter of 09/15/17  CT Renal Stone Study   Narrative CLINICAL DATA:  RIGHT-sided abdominal pain.  EXAM: CT ABDOMEN AND PELVIS WITHOUT CONTRAST  TECHNIQUE: Multidetector CT imaging of the abdomen and pelvis was performed following the standard protocol without IV contrast.  COMPARISON:  CT of the abdomen and pelvis on 08/27/2017 the  FINDINGS: Lower chest: There is mitral calcification. Heart size appears normal. Lung bases are unremarkable.  Hepatobiliary: Status post cholecystectomy.  No liver lesions.  Pancreas: Unremarkable. No pancreatic ductal dilatation or surrounding inflammatory changes.  Spleen: Normal in size without focal abnormality.  Adrenals/Urinary Tract: Renal glands are normal. There is  moderate RIGHT hydronephrosis secondary to a calculus at the ureteropelvic junction which measures 9 millimeters in diameter. There is RIGHT perinephric stranding. The distal RIGHT ureter is unremarkable. The LEFT kidney and ureter are unremarkable. Urinary bladder is normal in appearance.  Stomach/Bowel: The stomach and small bowel loops are normal in appearance. There are numerous colonic diverticula but no associated inflammation. Appendix is not seen.  Vascular/Lymphatic: There is atherosclerotic calcification of the abdominal aorta not associated with aneurysm. No abdominal adenopathy. There is subtle stranding in the mesenteric fat not associated significant adenopathy.  Reproductive: Uterus is present.  No adnexal mass.  Other: No free pelvic fluid. There is anterior abdominal wall laxity. No hernia.  Musculoskeletal: There is grade 1 anterolisthesis of L4 on L5, measuring 7 millimeters. Degenerative changes are seen in the LOWER thoracic and lumbar spine.  IMPRESSION: 1. 9 millimeter obstructing stone at the RIGHT ureteropelvic junction with associated hydronephrosis and perinephric stranding. 2. Mitral calcifications. 3. Cholecystectomy. 4. Significant colonic diverticulosis. 5.  Aortic atherosclerosis.  (ICD10-I70.0) 6. Spondylosis.  Grade 1 anterolisthesis of L4 on L5.   Electronically Signed   By: Nolon Nations M.D.   On: 09/15/2017 11:54     Assessment & Plan:   72 year old female status post right ureteral stent placement for a 9 mm obstructing calculus with infection.  Management options for her stone were discussed including ureteroscopic removal and shockwave lithotripsy.  She was informed the most successful procedure would be ureteroscopic removal.  The advantages of shockwave lithotripsy be noninvasive and performed under conscious sedation were reviewed however the success rate is approximately 80-85%.  She would like to schedule ureteroscopic  removal.  The indications and nature of the planned procedure were discussed as well as the potential  benefits and expected outcome.  Alternatives have been discussed in detail. The most common complications and side effects were discussed including but not limited to infection/sepsis; blood loss; damage to urethra, bladder, ureter, kidney; need for multiple surgeries; need for prolonged stent placement as well as general anesthesia risks. Although uncommon she was also informed of the possibility that the calculus may not be able to be treated due to inability to obtain access to the upper ureter. In that event she would require stent placement and a follow-up procedure after a period of stent dilation. All of her questions were answered and she desires to proceed.  At the time of our visit she did not appear to be distracted or in pain.     Abbie Sons, [place] [address], [place], [place] [address] [phone]

## 2017-09-30 ENCOUNTER — Encounter: Payer: Self-pay | Admitting: Urology

## 2017-10-01 ENCOUNTER — Other Ambulatory Visit: Payer: Self-pay | Admitting: Radiology

## 2017-10-01 LAB — CULTURE, URINE COMPREHENSIVE

## 2017-10-03 ENCOUNTER — Other Ambulatory Visit: Payer: Self-pay | Admitting: Radiology

## 2017-10-03 DIAGNOSIS — N201 Calculus of ureter: Secondary | ICD-10-CM

## 2017-10-04 ENCOUNTER — Other Ambulatory Visit: Payer: Self-pay

## 2017-10-04 ENCOUNTER — Other Ambulatory Visit: Payer: Self-pay | Admitting: Radiology

## 2017-10-04 ENCOUNTER — Encounter
Admission: RE | Admit: 2017-10-04 | Discharge: 2017-10-04 | Disposition: A | Discharge status: Home / Self Care | Payer: Medicare Other | Source: Ambulatory Visit | Attending: Urology | Admitting: Urology

## 2017-10-04 ENCOUNTER — Telehealth: Payer: Self-pay | Admitting: Radiology

## 2017-10-04 DIAGNOSIS — R319 Hematuria, unspecified: Principal | ICD-10-CM

## 2017-10-04 DIAGNOSIS — N390 Urinary tract infection, site not specified: Secondary | ICD-10-CM

## 2017-10-04 HISTORY — DX: Unspecified asthma, uncomplicated: J45.909

## 2017-10-04 HISTORY — DX: Personal history of urinary calculi: Z87.442

## 2017-10-04 HISTORY — DX: Overactive bladder: N32.81

## 2017-10-04 MED ORDER — AMOXICILLIN 875 MG PO TABS: 875.0000 mg | ORAL_TABLET | Freq: Two times a day (BID) | ORAL | 0 refills | Status: DC

## 2017-10-04 NOTE — Patient Instructions (Signed)
Your procedure is scheduled on: October 09, 2017 TUESDAY Report to Day Surgery on the 2nd floor of the [facility]. To find out your arrival time, please call [phone] between 1PM - 3PM on: Monday October 08, 2017  REMEMBER: Instructions that are not followed completely may result in serious medical risk, up to and including death; or upon the discretion of your surgeon and anesthesiologist your surgery may need to be rescheduled.  Do not eat food after midnight the night before surgery.  No gum chewing, lozengers or hard candies.  You may however, drink CLEAR liquids up to 2 hours before you are scheduled to arrive for your surgery. Do not drink anything within 2 hours of the start of your surgery.  Clear liquids include: - water   Do NOT drink anything that is not on this list.  Type 1 and Type 2 diabetics should only drink water.   No Alcohol for 24 hours before or after surgery.  No Smoking including e-cigarettes for 24 hours prior to surgery.  No chewable tobacco products for at least 6 hours prior to surgery.  No nicotine patches on the day of surgery.  On the morning of surgery brush your teeth with toothpaste and water, you may rinse your mouth with mouthwash if you wish. Do not swallow any toothpaste or mouthwash.  Notify your doctor if there is any change in your medical condition (cold, fever, infection).  Do not wear jewelry, make-up, hairpins, clips or nail polish.  Do not wear lotions, powders, or perfumes. You may wear deodorant.  Do not shave 48 hours prior to surgery. Men may shave face and neck.  Contacts and dentures may not be worn into surgery.  Do not bring valuables to the hospital, including drivers license, insurance or credit cards.  [facility] is not responsible for any belongings or valuables.   TAKE THESE MEDICATIONS THE MORNING OF SURGERY: MYRBETRIQ  Use inhalers on the day of surgery    Stop Metformin 2 days prior to surgery. LAST  DOSE ON SATURDAY  KEEP INSULIN PUMP ON BASAL RATE - NO BOLUSES  Follow recommendations from Cardiologist, Pulmonologist or PCP regarding stopping Aspirin   Stop Anti-inflammatories (NSAIDS) such as Advil, Aleve, Ibuprofen, Motrin, Naproxen, Naprosyn and Aspirin based products such as Excedrin, Goodys Powder, BC Powder. (May take Tylenol or Acetaminophen if needed.)  Stop ANY OVER THE COUNTER supplements until after surgery. (May continue Vitamin D, Vitamin B, and multivitamin.)  Wear comfortable clothing (specific to your surgery type) to the hospital.  Plan for stool softeners for home use.  If you are being discharged the day of surgery, you will not be allowed to drive home. You will need a responsible adult to drive you home and stay with you that night.   If you are taking public transportation, you will need to have a responsible adult with you. Please confirm with your physician that it is acceptable to use public transportation.   Please call [phone] if you have any questions about these instructions.

## 2017-10-04 NOTE — Telephone Encounter (Addendum)
Order received from Dr Bernardo Heater for Amoxicillin 875mg  twice daily x 7 days for positive urine culture.  Left message with Junie Panning at pre-admission testing to notify patient of positive urine culture & script sent to pharmacy due to patient present in their office for pre-op visit. Erin voices understanding & will relay message to patient.

## 2017-10-05 NOTE — Telephone Encounter (Signed)
Confirmed with patient that script was sent to pharmacy. Patient states she started taking medication last night.

## 2017-10-08 MED ORDER — CEFAZOLIN SODIUM-DEXTROSE 2-4 GM/100ML-% IV SOLN
2.0000 g | INTRAVENOUS | Status: AC
  Administered 2017-10-09: 2 g via INTRAVENOUS

## 2017-10-09 ENCOUNTER — Ambulatory Visit: Payer: Medicare Other | Admitting: Certified Registered Nurse Anesthetist

## 2017-10-09 ENCOUNTER — Encounter: Payer: Self-pay | Admitting: *Deleted

## 2017-10-09 ENCOUNTER — Other Ambulatory Visit: Payer: Self-pay

## 2017-10-09 ENCOUNTER — Encounter
Admission: RE | Disposition: A | Discharge status: Home / Self Care | Payer: Self-pay | Source: Ambulatory Visit | Attending: Urology

## 2017-10-09 ENCOUNTER — Ambulatory Visit
Admission: RE | Admit: 2017-10-09 | Discharge: 2017-10-09 | Disposition: A | Discharge status: Home / Self Care | Payer: Medicare Other | Source: Ambulatory Visit | Attending: Urology | Admitting: Urology

## 2017-10-09 DIAGNOSIS — Z6839 Body mass index (BMI) 39.0-39.9, adult: Secondary | ICD-10-CM | POA: Insufficient documentation

## 2017-10-09 DIAGNOSIS — E1122 Type 2 diabetes mellitus with diabetic chronic kidney disease: Secondary | ICD-10-CM | POA: Diagnosis not present

## 2017-10-09 DIAGNOSIS — J449 Chronic obstructive pulmonary disease, unspecified: Secondary | ICD-10-CM | POA: Insufficient documentation

## 2017-10-09 DIAGNOSIS — I130 Hypertensive heart and chronic kidney disease with heart failure and stage 1 through stage 4 chronic kidney disease, or unspecified chronic kidney disease: Secondary | ICD-10-CM | POA: Insufficient documentation

## 2017-10-09 DIAGNOSIS — Z794 Long term (current) use of insulin: Secondary | ICD-10-CM | POA: Diagnosis not present

## 2017-10-09 DIAGNOSIS — Z9981 Dependence on supplemental oxygen: Secondary | ICD-10-CM | POA: Insufficient documentation

## 2017-10-09 DIAGNOSIS — E1136 Type 2 diabetes mellitus with diabetic cataract: Secondary | ICD-10-CM | POA: Insufficient documentation

## 2017-10-09 DIAGNOSIS — Z87891 Personal history of nicotine dependence: Secondary | ICD-10-CM | POA: Insufficient documentation

## 2017-10-09 DIAGNOSIS — N201 Calculus of ureter: Secondary | ICD-10-CM | POA: Diagnosis not present

## 2017-10-09 DIAGNOSIS — I509 Heart failure, unspecified: Secondary | ICD-10-CM | POA: Insufficient documentation

## 2017-10-09 DIAGNOSIS — N189 Chronic kidney disease, unspecified: Secondary | ICD-10-CM | POA: Diagnosis not present

## 2017-10-09 HISTORY — PX: CYSTOSCOPY/URETEROSCOPY/HOLMIUM LASER/STENT PLACEMENT: SHX6546

## 2017-10-09 LAB — GLUCOSE, CAPILLARY
GLUCOSE-CAPILLARY: 205 mg/dL — AB (ref 70–99)
GLUCOSE-CAPILLARY: 226 mg/dL — AB (ref 70–99)
GLUCOSE-CAPILLARY: 196 mg/dL — AB (ref 70–99)

## 2017-10-09 SURGERY — CYSTOSCOPY/URETEROSCOPY/HOLMIUM LASER/STENT PLACEMENT
Anesthesia: General | Site: Ureter | Laterality: Right | Wound class: Clean Contaminated

## 2017-10-09 MED ORDER — SODIUM CHLORIDE 0.9 % IV SOLN
INTRAVENOUS | Status: DC
  Administered 2017-10-09: 07:00:00 via INTRAVENOUS

## 2017-10-09 MED ORDER — OXYBUTYNIN CHLORIDE 5 MG PO TABS: ORAL_TABLET | ORAL | 0 refills | Status: DC

## 2017-10-09 MED ORDER — ROCURONIUM BROMIDE 100 MG/10ML IV SOLN
INTRAVENOUS | Status: DC | PRN
  Administered 2017-10-09: 50 mg via INTRAVENOUS

## 2017-10-09 MED ORDER — ROCURONIUM BROMIDE 50 MG/5ML IV SOLN
INTRAVENOUS | Status: AC
  Filled 2017-10-09: qty 1

## 2017-10-09 MED ORDER — DEXAMETHASONE SODIUM PHOSPHATE 10 MG/ML IJ SOLN
INTRAMUSCULAR | Status: DC | PRN
  Administered 2017-10-09: 5 mg via INTRAVENOUS

## 2017-10-09 MED ORDER — FENTANYL CITRATE (PF) 100 MCG/2ML IJ SOLN
25.0000 ug | INTRAMUSCULAR | Status: DC | PRN
  Administered 2017-10-09 (×2): 25 ug via INTRAVENOUS

## 2017-10-09 MED ORDER — CEFAZOLIN SODIUM-DEXTROSE 2-4 GM/100ML-% IV SOLN
INTRAVENOUS | Status: AC
  Filled 2017-10-09: qty 100

## 2017-10-09 MED ORDER — SODIUM CHLORIDE 0.9 % IV SOLN
2.0000 g | Freq: Once | INTRAVENOUS | Status: DC
  Filled 2017-10-09: qty 2000

## 2017-10-09 MED ORDER — ONDANSETRON HCL 4 MG/2ML IJ SOLN: 4.0000 mg | Freq: Once | INTRAMUSCULAR | Status: DC | PRN

## 2017-10-09 MED ORDER — ACETAMINOPHEN 325 MG PO TABS
ORAL_TABLET | ORAL | Status: AC
  Filled 2017-10-09: qty 2

## 2017-10-09 MED ORDER — FAMOTIDINE 20 MG PO TABS
20.0000 mg | ORAL_TABLET | Freq: Once | ORAL | Status: AC
  Administered 2017-10-09: 20 mg via ORAL

## 2017-10-09 MED ORDER — SUGAMMADEX SODIUM 200 MG/2ML IV SOLN
INTRAVENOUS | Status: AC
  Filled 2017-10-09: qty 2

## 2017-10-09 MED ORDER — ONDANSETRON HCL 4 MG/2ML IJ SOLN
INTRAMUSCULAR | Status: DC | PRN
  Administered 2017-10-09: 4 mg via INTRAVENOUS

## 2017-10-09 MED ORDER — DEXAMETHASONE SODIUM PHOSPHATE 10 MG/ML IJ SOLN
INTRAMUSCULAR | Status: AC
  Filled 2017-10-09: qty 1

## 2017-10-09 MED ORDER — PHENYLEPHRINE HCL 10 MG/ML IJ SOLN
INTRAMUSCULAR | Status: AC
  Filled 2017-10-09: qty 1

## 2017-10-09 MED ORDER — LIDOCAINE HCL (PF) 2 % IJ SOLN
INTRAMUSCULAR | Status: AC
  Filled 2017-10-09: qty 10

## 2017-10-09 MED ORDER — PROPOFOL 10 MG/ML IV BOLUS
INTRAVENOUS | Status: DC | PRN
  Administered 2017-10-09: 20 mg via INTRAVENOUS
  Administered 2017-10-09: 160 mg via INTRAVENOUS

## 2017-10-09 MED ORDER — FENTANYL CITRATE (PF) 100 MCG/2ML IJ SOLN
INTRAMUSCULAR | Status: DC | PRN
  Administered 2017-10-09: 50 ug via INTRAVENOUS
  Administered 2017-10-09: 100 ug via INTRAVENOUS

## 2017-10-09 MED ORDER — LIDOCAINE HCL (CARDIAC) PF 100 MG/5ML IV SOSY
PREFILLED_SYRINGE | INTRAVENOUS | Status: DC | PRN
  Administered 2017-10-09: 100 mg via INTRAVENOUS

## 2017-10-09 MED ORDER — FAMOTIDINE 20 MG PO TABS
ORAL_TABLET | ORAL | Status: AC
  Administered 2017-10-09: 20 mg via ORAL
  Filled 2017-10-09: qty 1

## 2017-10-09 MED ORDER — FENTANYL CITRATE (PF) 100 MCG/2ML IJ SOLN
INTRAMUSCULAR | Status: AC
  Administered 2017-10-09: 25 ug via INTRAVENOUS
  Filled 2017-10-09: qty 2

## 2017-10-09 MED ORDER — ONDANSETRON HCL 4 MG/2ML IJ SOLN
INTRAMUSCULAR | Status: AC
  Filled 2017-10-09: qty 2

## 2017-10-09 MED ORDER — ACETAMINOPHEN 325 MG PO TABS
650.0000 mg | ORAL_TABLET | Freq: Once | ORAL | Status: AC
  Administered 2017-10-09: 650 mg via ORAL

## 2017-10-09 MED ORDER — SUGAMMADEX SODIUM 200 MG/2ML IV SOLN
INTRAVENOUS | Status: DC | PRN
Start: 2017-10-09 — End: 2017-10-09
  Administered 2017-10-09: 200 mg via INTRAVENOUS

## 2017-10-09 MED ORDER — PROPOFOL 10 MG/ML IV BOLUS
INTRAVENOUS | Status: AC
  Filled 2017-10-09: qty 20

## 2017-10-09 MED ORDER — IOTHALAMATE MEGLUMINE 43 % IV SOLN
INTRAVENOUS | Status: DC | PRN
  Administered 2017-10-09: 15 mL via URETHRAL

## 2017-10-09 MED ORDER — FENTANYL CITRATE (PF) 250 MCG/5ML IJ SOLN
INTRAMUSCULAR | Status: AC
  Filled 2017-10-09: qty 5

## 2017-10-09 SURGICAL SUPPLY — 28 items
BAG DRAIN CYSTO-URO LG1000N (MISCELLANEOUS) ×3 IMPLANT
BASKET ZERO TIP 1.9FR (BASKET) ×3 IMPLANT
BNDG COHESIVE 4X5 TAN STRL (GAUZE/BANDAGES/DRESSINGS) ×3 IMPLANT
BRUSH SCRUB EZ 1% IODOPHOR (MISCELLANEOUS) ×3 IMPLANT
CATH URETL 5X70 OPEN END (CATHETERS) IMPLANT
CNTNR SPEC 2.5X3XGRAD LEK (MISCELLANEOUS) ×1
CONRAY 43 FOR UROLOGY 50M (MISCELLANEOUS) ×3 IMPLANT
CONT SPEC 4OZ STER OR WHT (MISCELLANEOUS) ×2
CONTAINER SPEC 2.5X3XGRAD LEK (MISCELLANEOUS) ×1 IMPLANT
DRAPE UTILITY 15X26 TOWEL STRL (DRAPES) ×3 IMPLANT
FIBER LASER LITHO 273 (Laser) ×3 IMPLANT
GLOVE BIO SURGEON STRL SZ8 (GLOVE) ×3 IMPLANT
GOWN STRL REUS W/ TWL LRG LVL3 (GOWN DISPOSABLE) ×2 IMPLANT
GOWN STRL REUS W/TWL LRG LVL3 (GOWN DISPOSABLE) ×4
INFUSOR MANOMETER BAG 3000ML (MISCELLANEOUS) ×3 IMPLANT
INTRODUCER DILATOR DOUBLE (INTRODUCER) IMPLANT
KIT TURNOVER CYSTO (KITS) ×3 IMPLANT
PACK CYSTO AR (MISCELLANEOUS) ×3 IMPLANT
SENSORWIRE 0.038 NOT ANGLED (WIRE) ×3
SET CYSTO W/LG BORE CLAMP LF (SET/KITS/TRAYS/PACK) ×3 IMPLANT
SHEATH URETERAL 12FRX35CM (MISCELLANEOUS) IMPLANT
SOL .9 NS 3000ML IRR  AL (IV SOLUTION) ×2
SOL .9 NS 3000ML IRR UROMATIC (IV SOLUTION) ×1 IMPLANT
STENT URET 6FRX24 CONTOUR (STENTS) ×3 IMPLANT
STENT URET 6FRX26 CONTOUR (STENTS) IMPLANT
SURGILUBE 2OZ TUBE FLIPTOP (MISCELLANEOUS) ×3 IMPLANT
WATER STERILE IRR 1000ML POUR (IV SOLUTION) ×3 IMPLANT
WIRE SENSOR 0.038 NOT ANGLED (WIRE) ×1 IMPLANT

## 2017-10-09 NOTE — Interval H&P Note (Signed)
History and Physical Interval Note:  10/09/2017 8:44 AM  Karen [name]  has presented today for surgery, with the diagnosis of right ureteral calculus  The various methods of treatment have been discussed with the patient and family. After consideration of risks, benefits and other options for treatment, the patient has consented to  Procedure(s) with comments: CYSTOSCOPY/URETEROSCOPY/HOLMIUM LASER/STENT PLACEMENT (Right) - right Stent exchange as a surgical intervention .  The patient's history has been reviewed, patient examined, no change in status, stable for surgery.  I have reviewed the patient's chart and labs.  Questions were answered to the patient's satisfaction.     [place]

## 2017-10-09 NOTE — Progress Notes (Signed)
Pt has insulin pump  Blood sugar 226   Pt gave self 4 units regular via pump  Dr Lynann Bologna aware of this and is good with it

## 2017-10-09 NOTE — Transfer of Care (Signed)
Immediate Anesthesia Transfer of Care Note  Patient: Karen [name]  Procedure(s) Performed: CYSTOSCOPY/URETEROSCOPY/HOLMIUM LASER/STENT PLACEMENT (Right Ureter)  Patient Location: PACU  Anesthesia Type:General  Level of Consciousness: awake, alert , oriented and patient cooperative  Airway & Oxygen Therapy: Patient Spontanous Breathing and Patient connected to nasal cannula oxygen  Post-op Assessment: Report given to RN and Post -op Vital signs reviewed and stable  Post vital signs: Reviewed and stable  Last Vitals:  Vitals Value Taken Time  BP 161/68 10/09/2017 10:11 AM  Temp    Pulse 90 10/09/2017 10:11 AM  Resp    SpO2 95 % 10/09/2017 10:11 AM  Vitals shown include unvalidated device data.  Last Pain:  Vitals:   10/09/17 0639  TempSrc: Tympanic  PainSc: 0-No pain         Complications: No apparent anesthesia complications

## 2017-10-09 NOTE — Discharge Instructions (Signed)

## 2017-10-09 NOTE — Anesthesia Post-op Follow-up Note (Signed)
Anesthesia QCDR form completed.        

## 2017-10-09 NOTE — Progress Notes (Signed)
Headache better

## 2017-10-09 NOTE — Progress Notes (Signed)
Tylenol 650 po given for headache which she said she has had all day

## 2017-10-09 NOTE — Anesthesia Preprocedure Evaluation (Signed)
Anesthesia Evaluation  Patient identified by MRN, date of birth, ID band Patient awake    Reviewed: Allergy & Precautions, H&P , NPO status , Patient's Chart, lab work & pertinent test results, reviewed documented beta blocker date and time   History of Anesthesia Complications Negative for: history of anesthetic complications  Airway Mallampati: III  TM Distance: >3 FB Neck ROM: full    Dental  (+) Edentulous Upper, Dental Advidsory Given, Missing, Poor Dentition, Upper Dentures   Pulmonary shortness of breath, with exertion and Long-Term Oxygen Therapy, neg sleep apnea, COPD,  COPD inhaler and oxygen dependent, neg recent URI, former smoker,           Cardiovascular Exercise Tolerance: Good hypertension, (-) angina+CHF  (-) CAD, (-) Past MI, (-) Cardiac Stents and (-) CABG (-) dysrhythmias (-) Valvular Problems/Murmurs     Neuro/Psych negative neurological ROS  negative psych ROS   GI/Hepatic negative GI ROS, Neg liver ROS,   Endo/Other  diabetes, Insulin Dependent, Oral Hypoglycemic AgentsMorbid obesity  Renal/GU CRFRenal disease  negative genitourinary   Musculoskeletal   Abdominal   Peds  Hematology  (+) Blood dyscrasia, anemia ,   Anesthesia Other Findings Past Medical History: 01/07/2015: Acute on chronic respiratory failure with hypoxia and  hypercapnia (HCC) No date: Anemia 01/07/2015: Asterixis No date: Cataract No date: CHF (congestive heart failure) ([place]) 08/10/2017: Chronic kidney disease No date: CKD (chronic kidney disease) No date: COPD (chronic obstructive pulmonary disease) (HCC) No date: Diabetes mellitus without complication ([place]) 7/86/7672: Edema, peripheral No date: Hypertension 06/22/2014: Iron deficiency anemia 10/19/2015: Leucocytosis 09/01/2016: Primary osteoarthritis of right knee No date: Renal insufficiency 01/07/2015: Sciatica   Reproductive/Obstetrics negative OB ROS                              Anesthesia Physical  Anesthesia Plan  ASA: IV  Anesthesia Plan: General   Post-op Pain Management:    Induction: Intravenous  PONV Risk Score and Plan: 3 and Ondansetron and Dexamethasone  Airway Management Planned: LMA and Oral ETT  Additional Equipment:   Intra-op Plan:   Post-operative Plan: Extubation in OR  Informed Consent: I have reviewed the patients History and Physical, chart, labs and discussed the procedure including the risks, benefits and alternatives for the proposed anesthesia with the patient or authorized representative who has indicated his/her understanding and acceptance.   Dental Advisory Given  Plan Discussed with: Anesthesiologist, CRNA and Surgeon  Anesthesia Plan Comments:         Anesthesia Quick Evaluation

## 2017-10-09 NOTE — Op Note (Signed)
Preoperative diagnosis: Right proximal ureteral calculus  Postoperative diagnosis: Right proximal ureteral calculus  Procedure:  1. Cystoscopy 2. Right ureteroscopy and stone removal 3. Ureteroscopic laser lithotripsy 4. Right ureteral stent placement (6 FR) 24 cm 5. Right retrograde pyelography with interpretation  Surgeon: Nicki Reaper C. Brycen Bean, M.D.  Anesthesia: General  Complications: None  Intraoperative findings:  1.  Right retrograde pyelography post procedure showed no filling defects, stone fragments or contrast extravasation  EBL: Minimal  Specimens: 1. Calculus fragments for analysis   Indication: Karen [name] is a 72 y.o. year old patient with who presented to the ED on 09/15/2017 with an obstructing 9 mm right proximal ureteral calculus with sepsis.  She was stented urgently and presents today for definitive stone treatment. After reviewing the management options for treatment, the patient elected to proceed with the above surgical procedure(s). We have discussed the potential benefits and risks of the procedure, side effects of the proposed treatment, the likelihood of the patient achieving the goals of the procedure, and any potential problems that might occur during the procedure or recuperation. Informed consent has been obtained.  Description of procedure:  The patient was taken to the operating room and general anesthesia was induced.  The patient was placed in the dorsal lithotomy position, prepped and draped in the usual sterile fashion, and preoperative antibiotics were administered. A preoperative time-out was performed.   A 21 French cystoscope was lubricated and passed under direct vision.  Panendoscopy was performed and the bladder mucosa showed no erythema, solid or papillary lesions.  There were mild inflammatory changes secondary to her indwelling stent  The ureteral stent was grasped with endoscopic forceps and brought out to the urethral meatus.  A  0.038 Sensor wire was then placed through the stent and advanced to the renal pelvis under fluoroscopic guidance without difficulty.  The stent was removed.  A 4.5 French semirigid ureteroscope was passed per urethra and into the right ureteral orifice without difficulty.  The scope was advanced proximally until the stone was identified.  The stone was then fragmented with a 273 micron holmium laser fiber on a setting of 0.2 J and frequency of 20 hz.   All fragments were then removed from the ureter with a zero tip nitinol basket.  Reinspection of the ureter revealed no remaining visible stones or fragments.   Retrograde pyelogram was performed with findings as described above.  A 6 FR/24 CM stent was was placed under fluoroscopic guidance.  The wire was then removed with an adequate stent curl noted in the renal pelvis as well as in the bladder.  The bladder was then emptied and the procedure ended.  The patient appeared to tolerate the procedure well and without complications.  After anesthetic reversal the patient was transported to the PACU in stable condition.   Plan: The stent was left attached to a tether and will be removed by the patient on 10/15/2017.   John Giovanni, MD

## 2017-10-09 NOTE — Anesthesia Procedure Notes (Signed)
Procedure Name: Intubation Date/Time: 10/09/2017 9:02 AM Performed by: Bernardo Heater, CRNA Pre-anesthesia Checklist: Patient identified, Emergency Drugs available, Suction available and Patient being monitored Patient Re-evaluated:Patient Re-evaluated prior to induction Oxygen Delivery Method: Circle system utilized Preoxygenation: Pre-oxygenation with 100% oxygen Induction Type: IV induction Laryngoscope Size: Mac and 3 Grade View: Grade I Tube size: 7.0 mm Number of attempts: 1 Placement Confirmation: ETT inserted through vocal cords under direct vision,  positive ETCO2 and breath sounds checked- equal and bilateral Secured at: 21 cm Tube secured with: Tape Dental Injury: Teeth and Oropharynx as per pre-operative assessment

## 2017-10-10 NOTE — Anesthesia Postprocedure Evaluation (Addendum)
Anesthesia Post Note  Patient: Karen [name]  Procedure(s) Performed: CYSTOSCOPY/URETEROSCOPY/HOLMIUM LASER/STENT PLACEMENT (Right Ureter)  Patient location during evaluation: PACU Anesthesia Type: General Level of consciousness: awake and alert Pain management: pain level controlled Vital Signs Assessment: post-procedure vital signs reviewed and stable Respiratory status: spontaneous breathing, nonlabored ventilation, respiratory function stable and patient connected to nasal cannula oxygen Cardiovascular status: blood pressure returned to baseline and stable Postop Assessment: no apparent nausea or vomiting Anesthetic complications: no     Last Vitals:  Vitals:   10/09/17 1123 10/09/17 1145  BP: (!) 155/71 (!) 152/69  Pulse: 80 77  Resp: 16 16  Temp: (!) 36.2 C   SpO2: 94% 96%    Last Pain:  Vitals:   10/10/17 0858  TempSrc:   PainSc: 3                  Martha Clan

## 2017-10-11 NOTE — Progress Notes (Signed)
10/12/2017 11:01 AM   Karen [name] 1946/03/05 [id number]  Referring provider: Ricardo Jericho, NP [address] [place], [place] [address]  Chief Complaint  Patient presents with  . Urinary Incontinence    6wk    HPI: Patient is a 72 year old Caucasian female who presents today for follow up after a 6 week trial of Myrbetriq 25 mg daily.  She had an issue with a ureteral stone with sepsis over the last 6 weeks, so she has not had an opportunity to have a objective trial with the Myrbetriq.    She is to remove her stent on Monday 10/15/2017.  She has a RUS ordered and she is to follow up with Dr. Bernardo Heater on 11/12/2017 for results.    Husband was frustrated about her course with the stone and felt it shouldn't have taken "7" tries to get his wife's stone.   He stated he had stones in the past and his were removed without a problem.    I tried to explain that his wife's situation was more serious due to the associated infection/sepsis which made her stone more difficult to address.  We needed to approach her in a step wise manner in order to keep her safe and prevent her from dying.    I recommended they keep there follow up appointments for the stone and we will start again addressing her incontinence once she has fully recovered.   PMH: Past Medical History:  Diagnosis Date  . Acute on chronic respiratory failure with hypoxia and hypercapnia ([place]) 01/07/2015  . Anemia   . 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 Asthma   . Cataract   . CHF (congestive heart failure) ([place])   . Chronic kidney disease 08/10/2017  . CKD (chronic kidney disease)   . COPD (chronic obstructive pulmonary disease) ([place])   . Diabetes mellitus without complication ([place])   . Edema, peripheral 04/20/2014  . History of kidney stones   . Hypertension   . Iron deficiency anemia 06/22/2014  . Leucocytosis 10/19/2015  . Overactive bladder   . Primary osteoarthritis of right knee 09/01/2016  . Renal insufficiency    . Sciatica 01/07/2015    Surgical History: Past Surgical History:  Procedure Laterality Date  . APPENDECTOMY    . CESAREAN SECTION     x3  . CHOLECYSTECTOMY    . COLONOSCOPY WITH PROPOFOL N/A 08/28/2017   Procedure: COLONOSCOPY WITH PROPOFOL;  Surgeon: Lucilla Lame, MD;  Location: [facility] ENDOSCOPY;  Service: Endoscopy;  Laterality: N/A;  . COLONOSCOPY WITH PROPOFOL N/A 08/29/2017   Procedure: COLONOSCOPY WITH PROPOFOL;  Surgeon: Lucilla Lame, MD;  Location: [facility] ENDOSCOPY;  Service: Endoscopy;  Laterality: N/A;  . CYSTOSCOPY W/ URETERAL STENT PLACEMENT Right 09/15/2017   Procedure: CYSTOSCOPY WITH RETROGRADE PYELOGRAM/URETERAL STENT PLACEMENT;  Surgeon: Cleon Gustin, MD;  Location: [facility] ORS;  Service: Urology;  Laterality: Right;  . CYSTOSCOPY/URETEROSCOPY/HOLMIUM LASER/STENT PLACEMENT Right 10/09/2017   Procedure: CYSTOSCOPY/URETEROSCOPY/HOLMIUM LASER/STENT PLACEMENT;  Surgeon: Abbie Sons, MD;  Location: [facility] ORS;  Service: Urology;  Laterality: Right;  right Stent exchange  . EYE SURGERY      Home Medications:  Allergies as of 10/12/2017      Reactions   Ace Inhibitors    Gabapentin Hives   Lisinopril    Lyrica [pregabalin]    Shrimp [shellfish Allergy] Swelling   Swelling of the lips      Medication List        Accurate as of 10/12/17 11:01 AM. Always  use your most recent med list.          amoxicillin 875 MG tablet Commonly known as:  AMOXIL Take 1 tablet (875 mg total) by mouth every 12 (twelve) hours.   aspirin 81 MG chewable tablet Chew 81 mg by mouth daily. On hold   atorvastatin 10 MG tablet Commonly known as:  LIPITOR Take 1 tablet by mouth daily.   BREO ELLIPTA 100-25 MCG/INH Aepb Generic drug:  fluticasone furoate-vilanterol Inhale 1 puff into the lungs daily.   estradiol 0.1 MG/GM vaginal cream Commonly known as:  ESTRACE Apply 0.4m (pea-sized amount)  just inside the vaginal introitus with a finger-tip on Monday, Wednesday and Friday  nights.   HUMALOG 100 UNIT/ML injection Generic drug:  insulin lispro Inject 76 Units into the skin daily. In VGO device   hydrochlorothiazide 25 MG tablet Commonly known as:  HYDRODIURIL Take 25 mg by mouth daily. On hold   insulin pump Soln Inject 1 each into the skin 3 times daily with meals, bedtime and 2 AM.   INVOKANA 100 MG Tabs tablet Generic drug:  canagliflozin Take 1 tablet by mouth daily.   Iron 325 (65 Fe) MG Tabs Take 1 tablet by mouth daily.   losartan 100 MG tablet Commonly known as:  COZAAR Take 100 mg by mouth daily.   metFORMIN 500 MG tablet Commonly known as:  GLUCOPHAGE Take 1,000 mg by mouth 2 (two) times daily with a meal.   mirabegron ER 25 MG Tb24 tablet Commonly known as:  MYRBETRIQ Take 1 tablet (25 mg total) by mouth daily.   montelukast 10 MG tablet Commonly known as:  SINGULAIR Take 10 mg by mouth at bedtime.   oxybutynin 5 MG tablet Commonly known as:  DITROPAN 1 tab tid prn frequency,urgency, bladder spasm   OXYGEN Place 3 L/min into the nose.   V-GO 40 Kit Inject into the skin as directed.   vitamin B-12 500 MCG tablet Commonly known as:  CYANOCOBALAMIN Take 500 mcg by mouth daily.       Allergies:  Allergies  Allergen Reactions  . Ace Inhibitors   . Gabapentin Hives  . Lisinopril   . Lyrica [Pregabalin]   . Shrimp [Shellfish Allergy] Swelling    Swelling of the lips    Family History: Family History  Family history unknown: Yes    Social History:  reports that she has quit smoking. She has never used smokeless tobacco. She reports that she does not drink alcohol or use drugs.  ROS: UROLOGY Frequent Urination?: Yes Hard to postpone urination?: No Burning/pain with urination?: No Get up at night to urinate?: Yes Leakage of urine?: Yes Urine stream starts and stops?: No Trouble starting stream?: No Do you have to strain to urinate?: No Blood in urine?: No Urinary tract infection?: No Sexually transmitted  disease?: No Injury to kidneys or bladder?: No Painful intercourse?: No Weak stream?: No Currently pregnant?: No Vaginal bleeding?: No Last menstrual period?: n  Gastrointestinal Nausea?: No Vomiting?: No Indigestion/heartburn?: No Diarrhea?: No Constipation?: No  Constitutional Fever: No Night sweats?: No Weight loss?: No Fatigue?: No  Skin Skin rash/lesions?: No Itching?: No  Eyes Blurred vision?: No Double vision?: Yes  Ears/Nose/Throat Sore throat?: Yes Sinus problems?: No  Hematologic/Lymphatic Swollen glands?: No Easy bruising?: No  Cardiovascular Leg swelling?: No Chest pain?: No  Respiratory Cough?: No Shortness of breath?: No  Endocrine Excessive thirst?: No  Musculoskeletal Back pain?: No Joint pain?: No  Neurological Headaches?: No Dizziness?: No  Psychologic Depression?: No Anxiety?: No  Physical Exam: BP (!) 143/69   Pulse 97   Ht _0  (1.6 m)   Wt 221 lb (100.2 kg)   BMI 39.15 kg/m   Constitutional: Well nourished. Alert and oriented, No acute distress. HEENT: [place] AT, moist mucus membranes. Trachea midline, no masses. Cardiovascular: No clubbing, cyanosis, or edema. Respiratory: Normal respiratory effort, no increased work of breathing. Skin: No rashes, bruises or suspicious lesions. Lymph: No cervical or inguinal adenopathy. Neurologic: Grossly intact, no focal deficits, moving all 4 extremities. Psychiatric: Normal mood and affect.   Assessment & Plan:   1. Ureteral stone S/P URS Stent to be removed by patient RUS pending RTC as schedule with Dr. Bernardo Heater  2. Incontinence RTC once recovered from stone    Zara Council, PA-C  [facility] [address] [place], [place] [address] [phone]

## 2017-10-12 ENCOUNTER — Encounter: Payer: Self-pay | Admitting: Urology

## 2017-10-12 ENCOUNTER — Ambulatory Visit (INDEPENDENT_AMBULATORY_CARE_PROVIDER_SITE_OTHER): Payer: Medicare Other | Admitting: Urology

## 2017-10-12 VITALS — BP 143/69 | HR 97 | Ht 63.0 in | Wt 221.0 lb

## 2017-10-12 DIAGNOSIS — R32 Unspecified urinary incontinence: Secondary | ICD-10-CM

## 2017-10-12 DIAGNOSIS — N201 Calculus of ureter: Secondary | ICD-10-CM

## 2017-10-15 LAB — STONE ANALYSIS
CA OXALATE, MONOHYDR.: 90 %
CA PHOS CRY STONE QL IR: 10 %
STONE WEIGHT KSTONE: 57.4 mg

## 2017-10-29 ENCOUNTER — Ambulatory Visit
Admission: RE | Admit: 2017-10-29 | Discharge: 2017-10-29 | Disposition: A | Discharge status: Home / Self Care | Payer: Medicare Other | Source: Ambulatory Visit | Attending: Urology | Admitting: Urology

## 2017-10-29 DIAGNOSIS — Z09 Encounter for follow-up examination after completed treatment for conditions other than malignant neoplasm: Secondary | ICD-10-CM | POA: Insufficient documentation

## 2017-10-29 DIAGNOSIS — N201 Calculus of ureter: Secondary | ICD-10-CM

## 2017-10-29 DIAGNOSIS — Z87442 Personal history of urinary calculi: Secondary | ICD-10-CM | POA: Insufficient documentation

## 2017-10-31 ENCOUNTER — Telehealth: Payer: Self-pay

## 2017-10-31 NOTE — Telephone Encounter (Signed)
Pt informed

## 2017-10-31 NOTE — Telephone Encounter (Signed)
-----   Message from Abbie Sons, MD sent at 10/30/2017  9:10 PM EDT ----- RUS showed no stones and kidney blockage was resolved

## 2017-11-12 ENCOUNTER — Ambulatory Visit (INDEPENDENT_AMBULATORY_CARE_PROVIDER_SITE_OTHER): Payer: Medicare Other | Admitting: Urology

## 2017-11-12 ENCOUNTER — Other Ambulatory Visit: Payer: Self-pay

## 2017-11-12 ENCOUNTER — Encounter: Payer: Self-pay | Admitting: Urology

## 2017-11-12 VITALS — BP 121/68 | HR 82 | Ht 63.0 in | Wt 221.0 lb

## 2017-11-12 DIAGNOSIS — Z87442 Personal history of urinary calculi: Secondary | ICD-10-CM | POA: Diagnosis not present

## 2017-11-12 DIAGNOSIS — N3941 Urge incontinence: Secondary | ICD-10-CM | POA: Insufficient documentation

## 2017-11-12 HISTORY — DX: Urge incontinence: N39.41

## 2017-11-12 MED ORDER — SOLIFENACIN SUCCINATE 10 MG PO TABS
10.0000 mg | ORAL_TABLET | Freq: Every day | ORAL | 5 refills | Status: DC
Start: 2017-11-12 — End: 2018-07-23

## 2017-11-12 NOTE — Progress Notes (Signed)
11/12/2017 1:40 PM   IYLA BALZARINI 07-27-1945 [id number]  Referring provider: Ricardo Jericho, NP [address] [place], [place] [address]  Chief Complaint  Patient presents with  . Follow-up    HPI: 72 year old female presents for postoperative follow-up.  She is status post right ureteroscopic stone removal on 10/10/2018 1949 mm right proximal ureteral calculus.  She initially presented with sepsis and required urgent ureteral stent placement.  Her stent was left attached to a tether and was removed on 8/12.  She had no problems post stent removal.  Stone analysis was 90% calcium oxalate monohydrate and 10% calcium phosphate.  This was her first stone.  A follow-up renal ultrasound postop showed no hydronephrosis or urinary calculi.   PMH: Past Medical History:  Diagnosis Date  . Acute on chronic respiratory failure with hypoxia and hypercapnia ([place]) 01/07/2015  . Anemia   . 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 Asthma   . Cataract   . CHF (congestive heart failure) ([place])   . Chronic kidney disease 08/10/2017  . CKD (chronic kidney disease)   . COPD (chronic obstructive pulmonary disease) ([place])   . Diabetes mellitus without complication ([place])   . Edema, peripheral 04/20/2014  . History of kidney stones   . Hypertension   . Iron deficiency anemia 06/22/2014  . Leucocytosis 10/19/2015  . Overactive bladder   . Primary osteoarthritis of right knee 09/01/2016  . Renal insufficiency   . Sciatica 01/07/2015    Surgical History: Past Surgical History:  Procedure Laterality Date  . APPENDECTOMY    . CESAREAN SECTION     x3  . CHOLECYSTECTOMY    . COLONOSCOPY WITH PROPOFOL N/A 08/28/2017   Procedure: COLONOSCOPY WITH PROPOFOL;  Surgeon: Lucilla Lame, MD;  Location: [facility]COPY;  Service: Endoscopy;  Laterality: N/A;  . COLONOSCOPY WITH PROPOFOL N/A 08/29/2017   Procedure: COLONOSCOPY WITH PROPOFOL;  Surgeon: Lucilla Lame, MD;  Location[facility] ENDOSCOPY;  Service: Endoscopy;   Laterality: N/A;  . CYSTOSCOPY W/ URETERAL STENT PLACEMENT Right 09/15/2017   Procedure: CYSTOSCOPY WITH RETROGRADE PYELOGRAM/URETERAL STENT PLACEMENT;  Surgeon: Cleon Gustin, MD;  Location: [facility] ORS;  Service: Urology;  Laterality: Right;  . CYSTOSCOPY/URETEROSCOPY/HOLMIUM LASER/STENT PLACEMENT Right 10/09/2017   Procedure: CYSTOSCOPY/URETEROSCOPY/HOLMIUM LASER/STENT PLACEMENT;  Surgeon: Abbie Sons, MD;  Location: [facility] ORS;  Service: Urology;  Laterality: Right;  right Stent exchange  . EYE SURGERY      Home Medications:  Allergies as of 11/12/2017      Reactions   Ace Inhibitors    Gabapentin Hives   Lisinopril    Lyrica [pregabalin]    Shrimp [shellfish Allergy] Swelling   Swelling of the lips      Medication List        Accurate as of 11/12/17  1:40 PM. Always use your most recent med list.          amoxicillin 875 MG tablet Commonly known as:  AMOXIL Take 1 tablet (875 mg total) by mouth every 12 (twelve) hours.   aspirin 81 MG chewable tablet Chew 81 mg by mouth daily. On hold   atorvastatin 10 MG tablet Commonly known as:  LIPITOR Take 1 tablet by mouth daily.   BREO ELLIPTA 100-25 MCG/INH Aepb Generic drug:  fluticasone furoate-vilanterol Inhale 1 puff into the lungs daily.   estradiol 0.1 MG/GM vaginal cream Commonly known as:  ESTRACE Apply 0.34m (pea-sized amount)  just inside the vaginal introitus with a finger-tip on Monday, Wednesday and Friday nights.   HUMALOG  100 UNIT/ML injection Generic drug:  insulin lispro Inject 76 Units into the skin daily. In VGO device   hydrochlorothiazide 25 MG tablet Commonly known as:  HYDRODIURIL Take 25 mg by mouth daily. On hold   insulin pump Soln Inject 1 each into the skin 3 times daily with meals, bedtime and 2 AM.   INVOKANA 100 MG Tabs tablet Generic drug:  canagliflozin Take 1 tablet by mouth daily.   Iron 325 (65 Fe) MG Tabs Take 1 tablet by mouth daily.   losartan 100 MG tablet Commonly  known as:  COZAAR Take 100 mg by mouth daily.   metFORMIN 500 MG tablet Commonly known as:  GLUCOPHAGE Take 1,000 mg by mouth 2 (two) times daily with a meal.   mirabegron ER 25 MG Tb24 tablet Commonly known as:  MYRBETRIQ Take 1 tablet (25 mg total) by mouth daily.   montelukast 10 MG tablet Commonly known as:  SINGULAIR Take 10 mg by mouth at bedtime.   oxybutynin 5 MG tablet Commonly known as:  DITROPAN 1 tab tid prn frequency,urgency, bladder spasm   OXYGEN Place 3 L/min into the nose.   V-GO 40 Kit Inject into the skin as directed.   vitamin B-12 500 MCG tablet Commonly known as:  CYANOCOBALAMIN Take 500 mcg by mouth daily.       Allergies:  Allergies  Allergen Reactions  . Ace Inhibitors   . Gabapentin Hives  . Lisinopril   . Lyrica [Pregabalin]   . Shrimp [Shellfish Allergy] Swelling    Swelling of the lips    Family History: Family History  Family history unknown: Yes    Social History:  reports that she has quit smoking. She has never used smokeless tobacco. She reports that she does not drink alcohol or use drugs.  ROS: UROLOGY Frequent Urination?: Yes Hard to postpone urination?: Yes Burning/pain with urination?: No Get up at night to urinate?: Yes Leakage of urine?: Yes Urine stream starts and stops?: No Trouble starting stream?: No Do you have to strain to urinate?: No Blood in urine?: No Urinary tract infection?: No Sexually transmitted disease?: No Injury to kidneys or bladder?: No Painful intercourse?: No Weak stream?: No Currently pregnant?: No Vaginal bleeding?: No Last menstrual period?: n  Gastrointestinal Nausea?: No Vomiting?: No Indigestion/heartburn?: No Diarrhea?: No Constipation?: No  Constitutional Fever: No Night sweats?: No Weight loss?: No Fatigue?: No  Skin Skin rash/lesions?: No Itching?: No  Eyes Blurred vision?: No Double vision?: No  Ears/Nose/Throat Sore throat?: No Sinus problems?:  Yes  Hematologic/Lymphatic Swollen glands?: No Easy bruising?: No  Cardiovascular Leg swelling?: No Chest pain?: No  Respiratory Cough?: No Shortness of breath?: No  Endocrine Excessive thirst?: No  Musculoskeletal Back pain?: Yes Joint pain?: Yes  Neurological Headaches?: Yes Dizziness?: No  Psychologic Depression?: No Anxiety?: No  Physical Exam: BP 121/68   Pulse 82   Ht '5\' 3"'  (1.6 m)   Wt 221 lb (100.2 kg)   BMI 39.15 kg/m   Constitutional:  Alert and oriented, No acute distress. HEENT: [place] AT, moist mucus membranes.  Trachea midline, no masses. Cardiovascular: No clubbing, cyanosis, or edema. Respiratory: Normal respiratory effort, no increased work of breathing. GI: Abdomen is soft, nontender, nondistended, no abdominal masses GU: No CVA tenderness Lymph: No cervical or inguinal lymphadenopathy. Skin: No rashes, bruises or suspicious lesions. Neurologic: Grossly intact, no focal deficits, moving all 4 extremities. Psychiatric: Normal mood and affect.   Results for orders placed during the hospital encounter of  10/29/17  US RENAL   Narrative CLINICAL DATA:  Right-sided ureteral stone status post stenting of the right kidney.  EXAM: RENAL / URINARY TRACT ULTRASOUND COMPLETE  COMPARISON:  Abdominal radiograph of September 17, 2017 and abdominal and pelvic CT scan of September 15, 2017.  FINDINGS: Right Kidney:  Length: 10.8 cm. The renal cortical echotexture remains lower than that of the adjacent liver. A small amount of perinephric fluid is suspected. The stent is not visualized and has apparently been removed. There is no hydronephrosis.  Left Kidney:  Length: 12.5 cm. The renal cortical echotexture is normal. There is no hydronephrosis.  Bladder:  Appears normal for degree of bladder distention. Bilateral ureteral jets are observed.  IMPRESSION: No residual hydronephrosis. Possible small amount of perinephric fluid on the right.  Normal  appearing left kidney.   Electronically Signed   By: David  Martinique M.D.   On: 10/30/2017 08:15     Assessment & Plan:   Doing well status post right ureteroscopic stone removal.  She is a first-time stone former and was informed she has an approximately 50% chance of forming another stone within the next 5 years.  We discussed a metabolic VOZDGUYQIH/47 urine study and general stone prevention guidelines.  She has elected the latter.  It was recommended she keep her urine output at greater than 2 L/day.  Dietary oxalate moderation and a low-sodium diet were both discussed.  Follow-up 6 months with a KUB.  She also has overactive bladder.  She had no improvement on Myrbetriq.  Will give a trial of an anticholinergic medication.  She may discontinue the Myrbetriq.    Abbie Sons, [place] [address] [place], [place] [address] [phone]

## 2017-11-30 ENCOUNTER — Other Ambulatory Visit: Payer: Self-pay | Admitting: Family Medicine

## 2017-11-30 DIAGNOSIS — Z780 Asymptomatic menopausal state: Secondary | ICD-10-CM

## 2017-11-30 DIAGNOSIS — E2839 Other primary ovarian failure: Secondary | ICD-10-CM

## 2017-12-04 ENCOUNTER — Ambulatory Visit
Admission: EM | Admit: 2017-12-04 | Discharge: 2017-12-04 | Disposition: A | Discharge status: Home / Self Care | Payer: Medicare Other | Attending: Family Medicine | Admitting: Family Medicine

## 2017-12-04 ENCOUNTER — Other Ambulatory Visit: Payer: Self-pay

## 2017-12-04 ENCOUNTER — Encounter: Payer: Self-pay | Admitting: Emergency Medicine

## 2017-12-04 DIAGNOSIS — J441 Chronic obstructive pulmonary disease with (acute) exacerbation: Secondary | ICD-10-CM | POA: Diagnosis not present

## 2017-12-04 MED ORDER — DOXYCYCLINE HYCLATE 100 MG PO CAPS: 100.0000 mg | ORAL_CAPSULE | Freq: Two times a day (BID) | ORAL | 0 refills | Status: DC

## 2017-12-04 MED ORDER — PREDNISONE 50 MG PO TABS: ORAL_TABLET | ORAL | 0 refills | Status: DC

## 2017-12-04 MED ORDER — BENZONATATE 100 MG PO CAPS: 100.0000 mg | ORAL_CAPSULE | Freq: Three times a day (TID) | ORAL | 0 refills | Status: DC | PRN

## 2017-12-04 NOTE — ED Triage Notes (Signed)
Patient in today c/o cough and chest congestion x 2 days. Patient denies fever. Patient has not tried any OTC medications.

## 2017-12-04 NOTE — Discharge Instructions (Signed)
Meds as prescribed. ° °Take care ° °Dr. Braden Deloach  °

## 2017-12-04 NOTE — ED Provider Notes (Signed)
[facility]    CSN: [id number] Arrival date & time: 12/04/17  0844  History   Chief Complaint Chief Complaint  Patient presents with  . Cough   HPI  72 year old female with an extensive past medical history presents with cough and congestion.  2-day history of cough, worsening shortness of breath, and chest congestion.  Patient is currently on 3 L of oxygen.  No fever.  No chills.  Husband states that she was particularly symptomatic last night.  Had severe cough and difficulty breathing.  No other reported symptoms.  No other complaints.   PMH, Surgical Hx, Family Hx, Social History reviewed and updated as below.  Past Medical History:  Diagnosis Date  . Acute on chronic respiratory failure with hypoxia and hypercapnia ([place]) 01/07/2015  . Anemia   . 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 Asthma   . Cataract   . CHF (congestive heart failure) ([place])   . Chronic kidney disease 08/10/2017  . CKD (chronic kidney disease)   . COPD (chronic obstructive pulmonary disease) ([place])   . Diabetes mellitus without complication ([place])   . Edema, peripheral 04/20/2014  . History of kidney stones   . Hypertension   . Iron deficiency anemia 06/22/2014  . Leucocytosis 10/19/2015  . Overactive bladder   . Primary osteoarthritis of right knee 09/01/2016  . Renal insufficiency   . Sciatica 01/07/2015    Patient Active Problem List   Diagnosis Date Noted  . Personal history of kidney stones 11/12/2017  . Urge incontinence 11/12/2017  . Right ureteral stone 09/15/2017  . Acute GI bleeding   . GI bleed 08/26/2017  . Arthritis 08/10/2017  . Chronic kidney disease 08/10/2017  . COPD (chronic obstructive pulmonary disease) ([place]) 08/10/2017  . Diabetes mellitus type 2, uncomplicated ([place]) 40/98/1191  . Hypertension 08/10/2017  . Obesity (BMI 35.0-39.9 without comorbidity) 04/11/2017  . Primary osteoarthritis of right knee 09/01/2016  . Leucocytosis 10/19/2015  . 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 Acute on  chronic respiratory failure with hypoxia and hypercapnia ([place]) 01/07/2015  . Sciatica 01/07/2015  . Weakness 01/07/2015  . 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 Iron deficiency anemia 06/22/2014  . Microalbuminuria 06/22/2014  . CHF (congestive heart failure) ([place]) 04/20/2014  . Edema, peripheral 04/20/2014    Past Surgical History:  Procedure Laterality Date  . APPENDECTOMY    . CESAREAN SECTION     x3  . CHOLECYSTECTOMY    . COLONOSCOPY WITH PROPOFOL N/A 08/28/2017   Procedure: COLONOSCOPY WITH PROPOFOL;  Surgeon: Lucilla Lame, MD;  Location: [facility] ENDOSCOPY;  Service: Endoscopy;  Laterality: N/A;  . COLONOSCOPY WITH PROPOFOL N/A 08/29/2017   Procedure: COLONOSCOPY WITH PROPOFOL;  Surgeon: Lucilla Lame, MD;  Location: [facility] ENDOSCOPY;  Service: Endoscopy;  Laterality: N/A;  . CYSTOSCOPY W/ URETERAL STENT PLACEMENT Right 09/15/2017   Procedure: CYSTOSCOPY WITH RETROGRADE PYELOGRAM/URETERAL STENT PLACEMENT;  Surgeon: Cleon Gustin, MD;  Location: [facility] ORS;  Service: Urology;  Laterality: Right;  . CYSTOSCOPY/URETEROSCOPY/HOLMIUM LASER/STENT PLACEMENT Right 10/09/2017   Procedure: CYSTOSCOPY/URETEROSCOPY/HOLMIUM LASER/STENT PLACEMENT;  Surgeon: Abbie Sons, MD;  Location: [facility] ORS;  Service: Urology;  Laterality: Right;  right Stent exchange  . EYE SURGERY      OB History   None      Home Medications    Prior to Admission medications   Medication Sig Start Date End Date Taking? Authorizing Provider  aspirin 81 MG chewable tablet Chew 81 mg by mouth daily. On hold   Yes [provider]  atorvastatin (LIPITOR) 10 MG tablet Take 1 tablet by mouth daily.   Yes [provider]  canagliflozin (INVOKANA) 100 MG TABS tablet Take 1 tablet by mouth daily.   Yes [provider]  estradiol (ESTRACE VAGINAL) 0.1 MG/GM vaginal cream Apply 0.19m (pea-sized amount)  just inside the vaginal introitus with a finger-tip on Monday,  Wednesday and Friday nights. 08/31/17  Yes McGowan, SLarene BeachA, PA-C  Ferrous Sulfate (IRON) 325 (65 Fe) MG TABS Take 1 tablet by mouth daily. 09/14/17  Yes [provider]  fluticasone furoate-vilanterol (BREO ELLIPTA) 100-25 MCG/INH AEPB Inhale 1 puff into the lungs daily.   Yes [provider]  HUMALOG 100 UNIT/ML injection Inject 76 Units into the skin daily. In VGO device 08/19/17  Yes [provider]  hydrochlorothiazide (HYDRODIURIL) 25 MG tablet Take 25 mg by mouth daily. On hold   Yes [provider]  Insulin Human (INSULIN PUMP) SOLN Inject 1 each into the skin 3 times daily with meals, bedtime and 2 AM. Patient taking differently: Inject 1 each into the skin 3 times daily with meals, bedtime and 2 AM. Pt uses Novolog 09/18/17  Yes PDustin Flock MD  losartan (COZAAR) 100 MG tablet Take 100 mg by mouth daily. 09/12/17  Yes [provider]  metFORMIN (GLUCOPHAGE) 500 MG tablet Take 1,000 mg by mouth 2 (two) times daily with a meal.    Yes [provider]  montelukast (SINGULAIR) 10 MG tablet Take 10 mg by mouth at bedtime.   Yes [provider]  OXYGEN Place 3 L/min into the nose.    Yes [provider]  solifenacin (VESICARE) 10 MG tablet Take 1 tablet (10 mg total) by mouth daily. 11/12/17  Yes Stoioff, SRonda Fairly MD  vitamin B-12 (CYANOCOBALAMIN) 500 MCG tablet Take 500 mcg by mouth daily.   Yes [provider]  benzonatate (TESSALON) 100 MG capsule Take 1 capsule (100 mg total) by mouth 3 (three) times daily as needed. 12/04/17   CCoral Spikes DO  doxycycline (VIBRAMYCIN) 100 MG capsule Take 1 capsule (100 mg total) by mouth 2 (two) times daily. 12/04/17   CCoral Spikes DO  Insulin Disposable Pump (V-GO 40) KIT Inject into the skin as directed. 08/14/17   [provider]  predniSONE (DELTASONE) 50 MG tablet 1 tablet daily x 5 days. 12/04/17   CCoral Spikes DO    Family History Family History  Problem  Relation Age of Onset  . Other Mother        unknown medical history  . Other Father        unknown medical history    Social History Social History   Tobacco Use  . Smoking status: Former Smoker    Last attempt to quit: 12/04/1992    Years since quitting: 25.0  . Smokeless tobacco: Never Used  Substance Use Topics  . Alcohol use: No  . Drug use: No     Allergies   Ace inhibitors; Gabapentin; Lisinopril; Lyrica [pregabalin]; and Shrimp [shellfish allergy]   Review of Systems Review of Systems  Constitutional: Positive for fatigue. Negative for fever.  Respiratory: Positive for cough and shortness of breath.    Physical Exam Triage Vital Signs ED Triage Vitals [12/04/17 0851]  Enc Vitals Group     BP (!) 154/89     Pulse Rate 80     Resp 20     Temp 98.2 F (36.8 C)     Temp Source  Oral     SpO2 94 %     Weight 219 lb (99.3 kg)     Height '5\' 3"'  (1.6 m)     Head Circumference      Peak Flow      Pain Score 10     Pain Loc      Pain Edu?      Excl. in [place]?    Updated Vital Signs BP (!) 154/89 (BP Location: Left Arm)   Pulse 80   Temp 98.2 F (36.8 C) (Oral)   Resp 20   Ht '5\' 3"'  (1.6 m)   Wt 99.3 kg   SpO2 94%   BMI 38.79 kg/m   Visual Acuity Right Eye Distance:   Left Eye Distance:   Bilateral Distance:    Right Eye Near:   Left Eye Near:    Bilateral Near:     Physical Exam  Constitutional: She is oriented to person, place, and time. She appears well-developed. No distress.  HENT:  Head: Normocephalic and atraumatic.  Cardiovascular: Normal rate and regular rhythm.  Pulmonary/Chest:  Mild Increased work of breathing.  Diffuse expiratory wheezing.  Neurological: She is alert and oriented to person, place, and time.  Psychiatric: She has a normal mood and affect. Her behavior is normal.  Nursing note and vitals reviewed.  UC Treatments / Results  Labs (all labs ordered are listed, but only abnormal results are displayed) Labs Reviewed -  No data to display  EKG None  Radiology No results found.  Procedures Procedures (including critical care time)  Medications Ordered in UC Medications - No data to display  Initial Impression / Assessment and Plan / UC Course  I have reviewed the triage vital signs and the nursing notes.  Pertinent labs & imaging results that were available during my care of the patient were reviewed by me and considered in my medical decision making (see chart for details).    73 year old female presents with a COPD exacerbation.  Treating with doxycycline, prednisone, Tessalon Perles.  Final Clinical Impressions(s) / UC Diagnoses   Final diagnoses:  COPD exacerbation [facility])     Discharge Instructions     Meds as prescribed.  Take care  Dr. Lacinda Axon    ED Prescriptions    Medication Sig Dispense Auth. Provider   predniSONE (DELTASONE) 50 MG tablet 1 tablet daily x 5 days. 5 tablet Rjay Revolorio G, DO   doxycycline (VIBRAMYCIN) 100 MG capsule Take 1 capsule (100 mg total) by mouth 2 (two) times daily. 14 capsule Karryn Kosinski G, DO   benzonatate (TESSALON) 100 MG capsule Take 1 capsule (100 mg total) by mouth 3 (three) times daily as needed. 30 capsule Coral Spikes, DO     Controlled Substance Prescriptions [place] Controlled Substance Registry consulted? Not Applicable   Coral Spikes, DO 12/04/17 1022

## 2017-12-23 ENCOUNTER — Ambulatory Visit
Admission: EM | Admit: 2017-12-23 | Discharge: 2017-12-23 | Disposition: A | Discharge status: Home / Self Care | Payer: Medicare Other | Attending: Emergency Medicine | Admitting: Emergency Medicine

## 2017-12-23 ENCOUNTER — Encounter: Payer: Self-pay | Admitting: Emergency Medicine

## 2017-12-23 ENCOUNTER — Ambulatory Visit (INDEPENDENT_AMBULATORY_CARE_PROVIDER_SITE_OTHER): Payer: Medicare Other

## 2017-12-23 DIAGNOSIS — J441 Chronic obstructive pulmonary disease with (acute) exacerbation: Secondary | ICD-10-CM

## 2017-12-23 MED ORDER — AZITHROMYCIN 250 MG PO TABS: 250.0000 mg | ORAL_TABLET | Freq: Every day | ORAL | 0 refills | Status: DC

## 2017-12-23 NOTE — Discharge Instructions (Addendum)
suggest following up with your pulmonologist next week.

## 2017-12-23 NOTE — ED Triage Notes (Signed)
Patient c/o cough and chest congestion. Per patient was seen couple weeks ago for the same symptom. Per patient after she finish the medication that was given her symptoms started again.

## 2017-12-23 NOTE — ED Provider Notes (Signed)
[facility]    CSN: [id number] Arrival date & time: 12/23/17  1031     History   Chief Complaint No chief complaint on file.   HPI Karen [name] is a 72 y.o. female.   HPI  72 year old female with a history of recurrent COPD exacerbations diabetes mellitus chronic kidney disease CHF and hypertension presents with cough and congestion.  Been here on 12/04/2017 for COPD exacerbation on prednisone for 5 days doxycycline for 7 days And Tessalon Perles.  States that she did improve with her lung function and seem but she continues to have this nagging cough that we will keep her up at nighttime.  Continues to use continuous 3 L O2 by nasal cannula.  States that the cough is essentially nonproductive.  He denies any fever or chills.         Past Medical History:  Diagnosis Date  . Acute on chronic respiratory failure with hypoxia and hypercapnia ([place]) 01/07/2015  . Anemia   . 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 Asthma   . Cataract   . CHF (congestive heart failure) ([place])   . Chronic kidney disease 08/10/2017  . CKD (chronic kidney disease)   . COPD (chronic obstructive pulmonary disease) ([place])   . Diabetes mellitus without complication ([place])   . Edema, peripheral 04/20/2014  . History of kidney stones   . Hypertension   . Iron deficiency anemia 06/22/2014  . Leucocytosis 10/19/2015  . Overactive bladder   . Primary osteoarthritis of right knee 09/01/2016  . Renal insufficiency   . Sciatica 01/07/2015    Patient Active Problem List   Diagnosis Date Noted  . Personal history of kidney stones 11/12/2017  . Urge incontinence 11/12/2017  . Right ureteral stone 09/15/2017  . Acute GI bleeding   . GI bleed 08/26/2017  . Arthritis 08/10/2017  . Chronic kidney disease 08/10/2017  . COPD (chronic obstructive pulmonary disease) ([place]) 08/10/2017  . Diabetes mellitus type 2, uncomplicated ([place]) 62/95/2841  . Hypertension 08/10/2017  . Obesity (BMI 35.0-39.9 without comorbidity)  04/11/2017  . Primary osteoarthritis of right knee 09/01/2016  . Leucocytosis 10/19/2015  . 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 Acute on chronic respiratory failure with hypoxia and hypercapnia ([place]) 01/07/2015  . Sciatica 01/07/2015  . Weakness 01/07/2015  . 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 Iron deficiency anemia 06/22/2014  . Microalbuminuria 06/22/2014  . CHF (congestive heart failure) ([address]) 04/20/2014  . Edema, peripheral 04/20/2014    Past Surgical History:  Procedure Laterality Date  . APPENDECTOMY    . CESAREAN SECTION     x3  . CHOLECYSTECTOMY    . COLONOSCOPY WITH PROPOFOL N/A 08/28/2017   Procedure: COLONOSCOPY WITH PROPOFOL;  Surgeon: Lucilla Lame, MD;  Location: [facility] ENDOSCOPY;  Service: Endoscopy;  Laterality: N/A;  . COLONOSCOPY WITH PROPOFOL N/A 08/29/2017   Procedure: COLONOSCOPY WITH PROPOFOL;  Surgeon: Lucilla Lame, MD;  Location: [facility] ENDOSCOPY;  Service: Endoscopy;  Laterality: N/A;  . CYSTOSCOPY W/ URETERAL STENT PLACEMENT Right 09/15/2017   Procedure: CYSTOSCOPY WITH RETROGRADE PYELOGRAM/URETERAL STENT PLACEMENT;  Surgeon: Cleon Gustin, MD;  Location: [facility] ORS;  Service: Urology;  Laterality: Right;  . CYSTOSCOPY/URETEROSCOPY/HOLMIUM LASER/STENT PLACEMENT Right 10/09/2017   Procedure: CYSTOSCOPY/URETEROSCOPY/HOLMIUM LASER/STENT PLACEMENT;  Surgeon: Abbie Sons, MD;  Location: [facility] ORS;  Service: Urology;  Laterality: Right;  right Stent exchange  . EYE SURGERY      OB History   None      Home Medications    Prior  to Admission medications   Medication Sig Start Date End Date Taking? Authorizing Provider  aspirin 81 MG chewable tablet Chew 81 mg by mouth daily. On hold   Yes [provider]  canagliflozin (INVOKANA) 100 MG TABS tablet Take 1 tablet by mouth daily.   Yes [provider]  estradiol (ESTRACE VAGINAL) 0.1 MG/GM vaginal cream Apply 0.87m (pea-sized amount)  just inside the vaginal introitus with  a finger-tip on Monday, Wednesday and Friday nights. 08/31/17  Yes McGowan, SLarene BeachA, PA-C  Ferrous Sulfate (IRON) 325 (65 Fe) MG TABS Take 1 tablet by mouth daily. 09/14/17  Yes [provider]  fluticasone furoate-vilanterol (BREO ELLIPTA) 100-25 MCG/INH AEPB Inhale 1 puff into the lungs daily.   Yes [provider]  HUMALOG 100 UNIT/ML injection Inject 76 Units into the skin daily. In VGO device 08/19/17  Yes [provider]  hydrochlorothiazide (HYDRODIURIL) 25 MG tablet Take 25 mg by mouth daily. On hold   Yes [provider]  Insulin Disposable Pump (V-GO 40) KIT Inject into the skin as directed. 08/14/17  Yes [provider]  Insulin Human (INSULIN PUMP) SOLN Inject 1 each into the skin 3 times daily with meals, bedtime and 2 AM. Patient taking differently: Inject 1 each into the skin 3 times daily with meals, bedtime and 2 AM. Pt uses Novolog 09/18/17  Yes PDustin Flock MD  losartan (COZAAR) 100 MG tablet Take 100 mg by mouth daily. 09/12/17  Yes [provider]  metFORMIN (GLUCOPHAGE) 500 MG tablet Take 1,000 mg by mouth 2 (two) times daily with a meal.    Yes [provider]  OXYGEN Place 3 L/min into the nose.    Yes [provider]  predniSONE (DELTASONE) 50 MG tablet 1 tablet daily x 5 days. 12/04/17  Yes Cook, Jayce G, DO  solifenacin (VESICARE) 10 MG tablet Take 1 tablet (10 mg total) by mouth daily. 11/12/17  Yes Stoioff, SRonda Fairly MD  vitamin B-12 (CYANOCOBALAMIN) 500 MCG tablet Take 500 mcg by mouth daily.   Yes [provider]  atorvastatin (LIPITOR) 10 MG tablet Take 1 tablet by mouth daily.    [provider]  azithromycin (ZITHROMAX) 250 MG tablet Take 1 tablet (250 mg total) by mouth daily. Take first 2 tablets together, then 1 every day until finished. 12/23/17   RLorin Picket PA-C  montelukast (SINGULAIR) 10 MG tablet Take 10 mg by mouth at bedtime.    [provider]     Family History Family History  Problem Relation Age of Onset  . Other Mother        unknown medical history  . Other Father        unknown medical history    Social History Social History   Tobacco Use  . Smoking status: Former Smoker    Last attempt to quit: 12/04/1992    Years since quitting: 25.0  . Smokeless tobacco: Never Used  Substance Use Topics  . Alcohol use: No  . Drug use: No     Allergies   Ace inhibitors; Gabapentin; Lisinopril; Lyrica [pregabalin]; and Shrimp [shellfish allergy]   Review of Systems Review of Systems  Constitutional: Positive for activity change. Negative for appetite change, chills, fatigue and fever.  HENT: Positive for congestion.   Respiratory: Positive for cough and shortness of breath.   All other systems reviewed and are negative.    Physical Exam Triage Vital Signs ED Triage Vitals  Enc Vitals Group  BP 12/23/17 1057 139/60     Pulse Rate 12/23/17 1057 89     Resp 12/23/17 1057 18     Temp 12/23/17 1059 98.4 F (36.9 C)     Temp Source 12/23/17 1057 Oral     SpO2 12/23/17 1057 95 %     Weight 12/23/17 1056 219 lb (99.3 kg)     Height --      Head Circumference --      Peak Flow --      Pain Score --      Pain Loc --      Pain Edu? --      Excl. in [place]? --    No data found.  Updated Vital Signs BP 139/60 (BP Location: Left Arm)   Pulse 89   Temp 98.4 F (36.9 C)   Resp 18   Wt 219 lb (99.3 kg)   SpO2 95%   BMI 38.79 kg/m   Visual Acuity Right Eye Distance:   Left Eye Distance:   Bilateral Distance:    Right Eye Near:   Left Eye Near:    Bilateral Near:     Physical Exam  Constitutional: She is oriented to person, place, and time. She appears well-developed and well-nourished. No distress.  HENT:  Head: Normocephalic.  Eyes: Pupils are equal, round, and reactive to light. Right eye exhibits no discharge. Left eye exhibits no discharge.  Neck: Normal range of motion.  Pulmonary/Chest: Effort  normal. She has wheezes.  Patient has occasional scattered wheezing.  Fine crackles appreciated in the basilar areas.  Musculoskeletal: Normal range of motion.  Lymphadenopathy:    She has no cervical adenopathy.  Neurological: She is alert and oriented to person, place, and time.  Skin: Skin is warm and dry. She is not diaphoretic.  Psychiatric: She has a normal mood and affect. Her behavior is normal. Judgment and thought content normal.  Nursing note and vitals reviewed.    UC Treatments / Results  Labs (all labs ordered are listed, but only abnormal results are displayed) Labs Reviewed - No data to display  EKG None  Radiology Dg Chest 2 View  Result Date: 12/23/2017 CLINICAL DATA:  Cough despite antibiotic therapy. EXAM: CHEST - 2 VIEW COMPARISON:  CT chest 06/23/2016. FINDINGS: Heart size is upper limits of normal. Atherosclerotic calcifications are present at the aorta. Scarring lateral right lung is stable. Lungs are otherwise clear. The visualized soft tissues and bony thorax are unremarkable. IMPRESSION: 1. No acute cardiopulmonary disease. 2. Borderline cardiomegaly without failure. 3. Aortic atherosclerosis. Electronically Signed   By: San Morelle M.D.   On: 12/23/2017 12:45    Procedures Procedures (including critical care time)  Medications Ordered in UC Medications - No data to display  Initial Impression / Assessment and Plan / UC Course  I have reviewed the triage vital signs and the nursing notes.  Pertinent labs & imaging results that were available during my care of the patient were reviewed by me and considered in my medical decision making (see chart for details).   Discussed the x-ray findings.  Does not appear to be any significant cardiopulmonary disease.  That when she had her COPD exacerbations  she felt better after the antibiotic and prednisone.  This episode seems very mild and because of her diabetes I do not want to provide her with  additional steroid which could alter her blood sugars.  Will however prescribe Z-Pak since she is states that she is having sputum  production.  Highly encouraged her to follow-up with her pulmonologist the next week.  Continue using the Robitussin at nighttime for her coughing.   Final Clinical Impressions(s) / UC Diagnoses   Final diagnoses:  COPD exacerbation [facility])     Discharge Instructions     suggest following up with your pulmonologist next week.    ED Prescriptions    Medication Sig Dispense Auth. Provider   azithromycin (ZITHROMAX) 250 MG tablet Take 1 tablet (250 mg total) by mouth daily. Take first 2 tablets together, then 1 every day until finished. 6 tablet Lorin Picket, PA-C     Controlled Substance Prescriptions [place] Controlled Substance Registry consulted? Not Applicable   Lorin Picket, PA-C 12/23/17 1421

## 2018-01-25 DIAGNOSIS — E1142 Type 2 diabetes mellitus with diabetic polyneuropathy: Secondary | ICD-10-CM | POA: Insufficient documentation

## 2018-01-25 DIAGNOSIS — Z8639 Personal history of other endocrine, nutritional and metabolic disease: Secondary | ICD-10-CM | POA: Insufficient documentation

## 2018-02-04 ENCOUNTER — Encounter: Payer: Self-pay | Admitting: Emergency Medicine

## 2018-02-04 ENCOUNTER — Ambulatory Visit (INDEPENDENT_AMBULATORY_CARE_PROVIDER_SITE_OTHER): Payer: Medicare Other

## 2018-02-04 ENCOUNTER — Other Ambulatory Visit: Payer: Self-pay

## 2018-02-04 ENCOUNTER — Ambulatory Visit
Admission: EM | Admit: 2018-02-04 | Discharge: 2018-02-04 | Disposition: A | Discharge status: Home / Self Care | Payer: Medicare Other | Attending: Family Medicine | Admitting: Family Medicine

## 2018-02-04 DIAGNOSIS — J441 Chronic obstructive pulmonary disease with (acute) exacerbation: Secondary | ICD-10-CM

## 2018-02-04 MED ORDER — PREDNISONE 20 MG PO TABS: ORAL_TABLET | ORAL | 0 refills | Status: DC

## 2018-02-04 MED ORDER — AZITHROMYCIN 250 MG PO TABS: ORAL_TABLET | ORAL | 0 refills | Status: DC

## 2018-02-04 NOTE — ED Triage Notes (Signed)
Pt c/o cough, chest congestion, and headache. Started about 4 days ago. She has had it one and off for the past several months.

## 2018-02-04 NOTE — ED Provider Notes (Signed)
[facility]    CSN: [id number] Arrival date & time: 02/04/18  1120     History   Chief Complaint Chief Complaint  Patient presents with  . Cough    HPI CHANDELL ATTRIDGE is a 72 y.o. female.    URI  Presenting symptoms: congestion, cough and fatigue   Severity:  Moderate Onset quality:  Sudden Duration:  5 days Timing:  Constant Progression:  Worsening Chronicity:  Recurrent Relieved by:  None tried Ineffective treatments:  None tried Risk factors: being elderly, chronic cardiac disease, chronic kidney disease, chronic respiratory disease (copd), diabetes mellitus and sick contacts   Risk factors: no recent travel     Past Medical History:  Diagnosis Date  . Acute on chronic respiratory failure with hypoxia and hypercapnia ([place]) 01/07/2015  . Anemia   . 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 Asthma   . Cataract   . CHF (congestive heart failure) ([place])   . Chronic kidney disease 08/10/2017  . CKD (chronic kidney disease)   . COPD (chronic obstructive pulmonary disease) ([place])   . Diabetes mellitus without complication ([place])   . Edema, peripheral 04/20/2014  . History of kidney stones   . Hypertension   . Iron deficiency anemia 06/22/2014  . Leucocytosis 10/19/2015  . Overactive bladder   . Primary osteoarthritis of right knee 09/01/2016  . Renal insufficiency   . Sciatica 01/07/2015    Patient Active Problem List   Diagnosis Date Noted  . Personal history of kidney stones 11/12/2017  . Urge incontinence 11/12/2017  . Right ureteral stone 09/15/2017  . Acute GI bleeding   . GI bleed 08/26/2017  . Arthritis 08/10/2017  . Chronic kidney disease 08/10/2017  . COPD (chronic obstructive pulmonary disease) ([place]) 08/10/2017  . Diabetes mellitus type 2, uncomplicated ([place]) 91/50/5697  . Hypertension 08/10/2017  . Obesity (BMI 35.0-39.9 without comorbidity) 04/11/2017  . Primary osteoarthritis of right knee 09/01/2016  . Leucocytosis 10/19/2015  . Asterixis 01/07/2015    . Acute on chronic respiratory failure with hypoxia and hypercapnia ([place]) 01/07/2015  . Sciatica 01/07/2015  . Weakness 01/07/2015  . 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 Iron deficiency anemia 06/22/2014  . Microalbuminuria 06/22/2014  . CHF (congestive heart failure) ([place]) 04/20/2014  . Edema, peripheral 04/20/2014    Past Surgical History:  Procedure Laterality Date  . APPENDECTOMY    . CESAREAN SECTION     x3  . CHOLECYSTECTOMY    . COLONOSCOPY WITH PROPOFOL N/A 08/28/2017   Procedure: COLONOSCOPY WITH PROPOFOL;  Surgeon: Lucilla Lame, MD;  Location: [facility] ENDOSCOPY;  Service: Endoscopy;  Laterality: N/A;  . COLONOSCOPY WITH PROPOFOL N/A 08/29/2017   Procedure: COLONOSCOPY WITH PROPOFOL;  Surgeon: Lucilla Lame, MD;  Location: [facility] ENDOSCOPY;  Service: Endoscopy;  Laterality: N/A;  . CYSTOSCOPY W/ URETERAL STENT PLACEMENT Right 09/15/2017   Procedure: CYSTOSCOPY WITH RETROGRADE PYELOGRAM/URETERAL STENT PLACEMENT;  Surgeon: Cleon Gustin, MD;  Location: [facility] ORS;  Service: Urology;  Laterality: Right;  . CYSTOSCOPY/URETEROSCOPY/HOLMIUM LASER/STENT PLACEMENT Right 10/09/2017   Procedure: CYSTOSCOPY/URETEROSCOPY/HOLMIUM LASER/STENT PLACEMENT;  Surgeon: Abbie Sons, MD;  Location: [facility] ORS;  Service: Urology;  Laterality: Right;  right Stent exchange  . EYE SURGERY      OB History   None      Home Medications    Prior to Admission medications   Medication Sig Start Date End Date Taking? Authorizing Provider  aspirin 81 MG chewable tablet Chew 81 mg by mouth daily. On hold  Yes [provider]  atorvastatin (LIPITOR) 10 MG tablet Take 1 tablet by mouth daily.   Yes [provider]  canagliflozin (INVOKANA) 100 MG TABS tablet Take 1 tablet by mouth daily.   Yes [provider]  estradiol (ESTRACE VAGINAL) 0.1 MG/GM vaginal cream Apply 0.70m (pea-sized amount)  just inside the vaginal introitus with a finger-tip on  Monday, Wednesday and Friday nights. 08/31/17  Yes McGowan, SLarene BeachA, PA-C  Ferrous Sulfate (IRON) 325 (65 Fe) MG TABS Take 1 tablet by mouth daily. 09/14/17  Yes [provider]  HUMALOG 100 UNIT/ML injection Inject 76 Units into the skin daily. In VGO device 08/19/17  Yes [provider]  hydrochlorothiazide (HYDRODIURIL) 25 MG tablet Take 25 mg by mouth daily. On hold   Yes [provider]  Insulin Disposable Pump (V-GO 40) KIT Inject into the skin as directed. 08/14/17  Yes [provider]  Insulin Human (INSULIN PUMP) SOLN Inject 1 each into the skin 3 times daily with meals, bedtime and 2 AM. Patient taking differently: Inject 1 each into the skin 3 times daily with meals, bedtime and 2 AM. Pt uses Novolog 09/18/17  Yes PDustin Flock MD  losartan (COZAAR) 100 MG tablet Take 100 mg by mouth daily. 09/12/17  Yes [provider]  metFORMIN (GLUCOPHAGE) 500 MG tablet Take 1,000 mg by mouth 2 (two) times daily with a meal.    Yes [provider]  OXYGEN Place 3 L/min into the nose.    Yes [provider]  solifenacin (VESICARE) 10 MG tablet Take 1 tablet (10 mg total) by mouth daily. 11/12/17  Yes Stoioff, SRonda Fairly MD  vitamin B-12 (CYANOCOBALAMIN) 500 MCG tablet Take 500 mcg by mouth daily.   Yes [provider]  azithromycin (ZITHROMAX Z-PAK) 250 MG tablet 2 tabs po once day 1, then 1 tab po qd for next 4 days 02/04/18   CNorval Gable MD  fluticasone furoate-vilanterol (BREO ELLIPTA) 100-25 MCG/INH AEPB Inhale 1 puff into the lungs daily.    [provider]  montelukast (SINGULAIR) 10 MG tablet Take 10 mg by mouth at bedtime.    [provider]  predniSONE (DELTASONE) 20 MG tablet 3 tabs po once day 1, then 2 tabs po qd x 2 days, then 1 tab po qd x 2 days, then half a tab po qd x 2 days 02/04/18   CNorval Gable MD    Family History Family History  Problem Relation Age of Onset  . Other Mother         unknown medical history  . Other Father        unknown medical history    Social History Social History   Tobacco Use  . Smoking status: Former Smoker    Last attempt to quit: 12/04/1992    Years since quitting: 25.1  . Smokeless tobacco: Never Used  Substance Use Topics  . Alcohol use: No  . Drug use: No     Allergies   Ace inhibitors; Gabapentin; Lisinopril; Lyrica [pregabalin]; and Shrimp [shellfish allergy]   Review of Systems Review of Systems  Constitutional: Positive for fatigue.  HENT: Positive for congestion.   Respiratory: Positive for cough.      Physical Exam Triage Vital Signs ED Triage Vitals  Enc Vitals Group     BP 02/04/18 1140 (!) 157/68     Pulse Rate 02/04/18 1140 68     Resp 02/04/18 1140 18     Temp 02/04/18 1140  98.4 F (36.9 C)     Temp Source 02/04/18 1140 Oral     SpO2 02/04/18 1211 96 %     Weight 02/04/18 1136 221 lb (100.2 kg)     Height 02/04/18 1136 '5\' 3"'  (1.6 m)     Head Circumference --      Peak Flow --      Pain Score 02/04/18 1136 0     Pain Loc --      Pain Edu? --      Excl. in [place]? --    No data found.  Updated Vital Signs BP (!) 157/68 (BP Location: Left Arm)   Pulse 68   Temp 98.4 F (36.9 C) (Oral)   Resp 18   Ht '5\' 3"'  (1.6 m)   Wt 100.2 kg   SpO2 96%   BMI 39.15 kg/m   Visual Acuity Right Eye Distance:   Left Eye Distance:   Bilateral Distance:    Right Eye Near:   Left Eye Near:    Bilateral Near:     Physical Exam  Constitutional: She appears well-developed and well-nourished. No distress.  HENT:  Head: Normocephalic and atraumatic.  Right Ear: Tympanic membrane, external ear and ear canal normal.  Left Ear: Tympanic membrane, external ear and ear canal normal.  Nose: No nose lacerations, sinus tenderness, nasal deformity, septal deviation or nasal septal hematoma. No epistaxis.  No foreign bodies.  Mouth/Throat: Uvula is midline, oropharynx is clear and moist and mucous membranes are normal.  No oropharyngeal exudate.  Eyes: Right eye exhibits no discharge. Left eye exhibits no discharge. No scleral icterus.  Neck: Normal range of motion. Neck supple. No thyromegaly present.  Cardiovascular: Normal rate, regular rhythm and normal heart sounds.  Pulmonary/Chest: Effort normal. No stridor. No respiratory distress. She has no wheezes. She has rales (bases).  Lymphadenopathy:    She has no cervical adenopathy.  Skin: She is not diaphoretic.  Nursing note and vitals reviewed.    UC Treatments / Results  Labs (all labs ordered are listed, but only abnormal results are displayed) Labs Reviewed - No data to display  EKG None  Radiology Dg Chest 2 View  Result Date: 02/04/2018 CLINICAL DATA:  Sore throat, cough and congestion for 4 days. LEFT posterior lower lung pain today. History of bronchitis, COPD, ex-smoker, CHF. EXAM: CHEST - 2 VIEW COMPARISON:  Chest x-ray dated 12/23/2017. FINDINGS: Heart size and mediastinal contours are within normal limits. Atherosclerotic changes noted at the aortic arch. Lungs are at least mildly hyperexpanded. Lungs are clear. No pleural effusion or pneumothorax seen. Mild degenerative spondylosis within the upper and midthoracic spine. No acute or suspicious osseous finding. IMPRESSION: 1. No active cardiopulmonary disease. No evidence of pneumonia or pulmonary edema. 2. Lungs at least mildly hyperexpanded suggesting COPD. 3. Aortic atherosclerosis. Electronically Signed   By: Franki Cabot M.D.   On: 02/04/2018 12:34    Procedures Procedures (including critical care time)  Medications Ordered in UC Medications - No data to display  Initial Impression / Assessment and Plan / UC Course  I have reviewed the triage vital signs and the nursing notes.  Pertinent labs & imaging results that were available during my care of the patient were reviewed by me and considered in my medical decision making (see chart for details).      Final Clinical  Impressions(s) / UC Diagnoses   Final diagnoses:  COPD exacerbation [facility])    ED Prescriptions    Medication Sig Dispense  Auth. Provider   azithromycin (ZITHROMAX Z-PAK) 250 MG tablet 2 tabs po once day 1, then 1 tab po qd for next 4 days 6 each Norval Gable, MD   predniSONE (DELTASONE) 20 MG tablet 3 tabs po once day 1, then 2 tabs po qd x 2 days, then 1 tab po qd x 2 days, then half a tab po qd x 2 days 10 tablet Aleyna Cueva, Linward Foster, MD     1. x-ray results and diagnosis reviewed with patient 2. rx as per orders above; reviewed possible side effects, interactions, risks and benefits  3. Follow-up prn if symptoms worsen or don't improve   Controlled Substance Prescriptions [place] Controlled Substance Registry consulted? Not Applicable   Norval Gable, MD 02/04/18 1320

## 2018-04-02 ENCOUNTER — Other Ambulatory Visit: Payer: Self-pay | Admitting: Radiology

## 2018-04-02 DIAGNOSIS — N201 Calculus of ureter: Secondary | ICD-10-CM

## 2018-04-14 ENCOUNTER — Encounter: Payer: Self-pay | Admitting: Gynecology

## 2018-04-14 ENCOUNTER — Other Ambulatory Visit: Payer: Self-pay

## 2018-04-14 ENCOUNTER — Ambulatory Visit
Admission: EM | Admit: 2018-04-14 | Discharge: 2018-04-14 | Disposition: A | Discharge status: Home / Self Care | Payer: Medicare Other | Attending: Family Medicine | Admitting: Family Medicine

## 2018-04-14 DIAGNOSIS — R609 Edema, unspecified: Secondary | ICD-10-CM | POA: Diagnosis not present

## 2018-04-14 DIAGNOSIS — R600 Localized edema: Secondary | ICD-10-CM

## 2018-04-14 NOTE — ED Provider Notes (Signed)
[facility]    CSN: [id number] Arrival date & time: 04/14/18  1027     History   Chief Complaint No chief complaint on file.   HPI Karen [name] is a 73 y.o. female.   73 yo female with a c/o left foot swelling and pain for the past 3 days. Patient denies any falls or injuries, fevers, chills, rash. States last week she wore a knee brace for a day. Foot symptoms developed after this. Denies any calf swelling, chest pain or shortness of breath.   The history is provided by the patient.    Past Medical History:  Diagnosis Date  . Acute on chronic respiratory failure with hypoxia and hypercapnia ([place]) 01/07/2015  . Anemia   . 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 Asthma   . Cataract   . CHF (congestive heart failure) ([place])   . Chronic kidney disease 08/10/2017  . CKD (chronic kidney disease)   . COPD (chronic obstructive pulmonary disease) ([place])   . Diabetes mellitus without complication ([place])   . Edema, peripheral 04/20/2014  . History of kidney stones   . Hypertension   . Iron deficiency anemia 06/22/2014  . Leucocytosis 10/19/2015  . Overactive bladder   . Primary osteoarthritis of right knee 09/01/2016  . Renal insufficiency   . Sciatica 01/07/2015    Patient Active Problem List   Diagnosis Date Noted  . Personal history of kidney stones 11/12/2017  . Urge incontinence 11/12/2017  . Right ureteral stone 09/15/2017  . Acute GI bleeding   . GI bleed 08/26/2017  . Arthritis 08/10/2017  . Chronic kidney disease 08/10/2017  . COPD (chronic obstructive pulmonary disease) ([place]) 08/10/2017  . Diabetes mellitus type 2, uncomplicated ([place]) 25/36/6440  . Hypertension 08/10/2017  . Obesity (BMI 35.0-39.9 without comorbidity) 04/11/2017  . Primary osteoarthritis of right knee 09/01/2016  . Leucocytosis 10/19/2015  . 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 Acute on chronic respiratory failure with hypoxia and hypercapnia ([place]) 01/07/2015  . Sciatica 01/07/2015  . Weakness 01/07/2015  .  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 Iron deficiency anemia 06/22/2014  . Microalbuminuria 06/22/2014  . CHF (congestive heart failure) ([place]) 04/20/2014  . Edema, peripheral 04/20/2014    Past Surgical History:  Procedure Laterality Date  . APPENDECTOMY    . CESAREAN SECTION     x3  . CHOLECYSTECTOMY    . COLONOSCOPY WITH PROPOFOL N/A 08/28/2017   Procedure: COLONOSCOPY WITH PROPOFOL;  Surgeon: Lucilla Lame, MD;  Location: [facility] ENDOSCOPY;  Service: Endoscopy;  Laterality: N/A;  . COLONOSCOPY WITH PROPOFOL N/A 08/29/2017   Procedure: COLONOSCOPY WITH PROPOFOL;  Surgeon: Lucilla Lame, MD;  Location[facility] ENDOSCOPY;  Service: Endoscopy;  Laterality: N/A;  . CYSTOSCOPY W/ URETERAL STENT PLACEMENT Right 09/15/2017   Procedure: CYSTOSCOPY WITH RETROGRADE PYELOGRAM/URETERAL STENT PLACEMENT;  Surgeon: Cleon Gustin, MD;  Location: [facility] ORS;  Service: Urology;  Laterality: Right;  . CYSTOSCOPY/URETEROSCOPY/HOLMIUM LASER/STENT PLACEMENT Right 10/09/2017   Procedure: CYSTOSCOPY/URETEROSCOPY/HOLMIUM LASER/STENT PLACEMENT;  Surgeon: Abbie Sons, MD;  Location: [facility] ORS;  Service: Urology;  Laterality: Right;  right Stent exchange  . EYE SURGERY      OB History   No obstetric history on file.      Home Medications    Prior to Admission medications   Medication Sig Start Date End Date Taking? Authorizing Provider  aspirin 81 MG chewable tablet Chew 81 mg by mouth daily. On hold   Yes [provider]  atorvastatin (LIPITOR) 10 MG  tablet Take 1 tablet by mouth daily.   Yes [provider]  canagliflozin (INVOKANA) 100 MG TABS tablet Take 1 tablet by mouth daily.   Yes [provider]  estradiol (ESTRACE VAGINAL) 0.1 MG/GM vaginal cream Apply 0.36m (pea-sized amount)  just inside the vaginal introitus with a finger-tip on Monday, Wednesday and Friday nights. 08/31/17  Yes McGowan, SLarene BeachA, PA-C  Ferrous Sulfate (IRON) 325 (65 Fe) MG  TABS Take 1 tablet by mouth daily. 09/14/17  Yes [provider]  fluticasone furoate-vilanterol (BREO ELLIPTA) 100-25 MCG/INH AEPB Inhale 1 puff into the lungs daily.   Yes [provider]  HUMALOG 100 UNIT/ML injection Inject 76 Units into the skin daily. In VGO device 08/19/17  Yes [provider]  hydrochlorothiazide (HYDRODIURIL) 25 MG tablet Take 25 mg by mouth daily. On hold   Yes [provider]  Insulin Disposable Pump (V-GO 40) KIT Inject into the skin as directed. 08/14/17  Yes [provider]  losartan (COZAAR) 100 MG tablet Take 100 mg by mouth daily. 09/12/17  Yes [provider]  metFORMIN (GLUCOPHAGE) 500 MG tablet Take 1,000 mg by mouth 2 (two) times daily with a meal.    Yes [provider]  montelukast (SINGULAIR) 10 MG tablet Take 10 mg by mouth at bedtime.   Yes [provider]  OXYGEN Place 3 L/min into the nose.    Yes [provider]  predniSONE (DELTASONE) 20 MG tablet 3 tabs po once day 1, then 2 tabs po qd x 2 days, then 1 tab po qd x 2 days, then half a tab po qd x 2 days 02/04/18  Yes CNorval Gable MD  solifenacin (VESICARE) 10 MG tablet Take 1 tablet (10 mg total) by mouth daily. 11/12/17  Yes Stoioff, SRonda Fairly MD  vitamin B-12 (CYANOCOBALAMIN) 500 MCG tablet Take 500 mcg by mouth daily.   Yes [provider]  azithromycin (ZITHROMAX Z-PAK) 250 MG tablet 2 tabs po once day 1, then 1 tab po qd for next 4 days 02/04/18   CNorval Gable MD  Insulin Human (INSULIN PUMP) SOLN Inject 1 each into the skin 3 times daily with meals, bedtime and 2 AM. Patient taking differently: Inject 1 each into the skin 3 times daily with meals, bedtime and 2 AM. Pt uses Novolog 09/18/17   PDustin Flock MD    Family History Family History  Problem Relation Age of Onset  . Other Mother        unknown medical history  . Other Father        unknown medical history    Social History Social History    Tobacco Use  . Smoking status: Former Smoker    Last attempt to quit: 12/04/1992    Years since quitting: 25.3  . Smokeless tobacco: Never Used  Substance Use Topics  . Alcohol use: No  . Drug use: No     Allergies   Ace inhibitors; Gabapentin; Lisinopril; Lyrica [pregabalin]; and Shrimp [shellfish allergy]   Review of Systems Review of Systems   Physical Exam Triage Vital Signs ED Triage Vitals  Enc Vitals Group     BP 04/14/18 1004 128/60     Pulse Rate 04/14/18 1004 78     Resp 04/14/18 1004 18     Temp 04/14/18 1004 98.2 F (36.8 C)     Temp Source 04/14/18 1004 Oral     SpO2 04/14/18 1004 93 %     Weight 04/14/18 1003  222 lb (100.7 kg)     Height 04/14/18 1003 _0  (1.6 m)     Head Circumference --      Peak Flow --      Pain Score 04/14/18 1003 0     Pain Loc --      Pain Edu? --      Excl. in [place]? --    No data found.  Updated Vital Signs BP 128/60 (BP Location: Left Arm)   Pulse 78   Temp 98.2 F (36.8 C) (Oral)   Resp 18   Ht _1  (1.6 m)   Wt 100.7 kg   SpO2 93%   BMI 39.33 kg/m   Visual Acuity Right Eye Distance:   Left Eye Distance:   Bilateral Distance:    Right Eye Near:   Left Eye Near:    Bilateral Near:     Physical Exam Vitals signs and nursing note reviewed.  Constitutional:      General: She is not in acute distress.    Appearance: She is not toxic-appearing or diaphoretic.  Musculoskeletal:     Left lower leg: Edema (to the dorsal aspect of foot) present.  Neurological:     Mental Status: She is alert.      UC Treatments / Results  Labs (all labs ordered are listed, but only abnormal results are displayed) Labs Reviewed - No data to display  EKG None  Radiology No results found.  Procedures Procedures (including critical care time)  Medications Ordered in UC Medications - No data to display  Initial Impression / Assessment and Plan / UC Course  I have reviewed the triage vital signs and the nursing  notes.  Pertinent labs & imaging results that were available during my care of the patient were reviewed by me and considered in my medical decision making (see chart for details).      Final Clinical Impressions(s) / UC Diagnoses   Final diagnoses:  Edema of left foot  Dependent edema     Discharge Instructions     Elevate leg, mild to moderate compression with ace wrap    ED Prescriptions    None     1. diagnosis reviewed with patient and husband 2. Recommend supportive treatment as above 3. Follow-up prn if symptoms worsen or don't improve   Controlled Substance Prescriptions [place] Controlled Substance Registry consulted? Not Applicable   Norval Gable, MD 04/14/18 1109

## 2018-04-14 NOTE — ED Triage Notes (Signed)
Patient c/o left foot swollen x 3 days.

## 2018-04-14 NOTE — Discharge Instructions (Signed)
Elevate leg, mild to moderate compression with ace wrap

## 2018-05-06 ENCOUNTER — Ambulatory Visit: Payer: Medicare Other | Admitting: Urology

## 2018-05-12 NOTE — Progress Notes (Deleted)
05/13/2018 8:15 PM   Karen [name] [birth year] [id number]  Referring provider: Ricardo Jericho, NP [address] [place], [place] [address]  No chief complaint on file.   HPI: Patient is a 73 year old Caucasian female with a history of nephrolithiasis and incontinence who presents for a six month follow up.  History of nephrolithiasis 09/2017 CT renal stone study revealed 9 millimeter obstructing stone at the RIGHT ureteropelvic junction with associated hydronephrosis and perinephric stranding.  Underwent emergent stent placement for sepsis.  Returned to OR 10/09/2017 for URS/LL/ureteral stent exchange and subsequent self stent removal.  RUS 10/2017 revealed no residual hydronephrosis. Possible small amount of perinephric fluid on the right.  Normal appearing left kidney.  Stone analysis was 90% calcium oxalate monohydrate and 10% calcium phosphate.  KUB today ***  Incontinence The patient is  experiencing urgency x *** (***), frequency x *** (***), not/is restricting fluids to avoid visits to the restroom ***, not/is engaging in toilet mapping, incontinence x *** (***) and nocturia x *** (***).   Her BP is ***.   Her PVR is ***.     PMH: Past Medical History:  Diagnosis Date  . Acute on chronic respiratory failure with hypoxia and hypercapnia ([place]) 01/07/2015  . Anemia   . 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 Asthma   . Cataract   . CHF (congestive heart failure) ([place])   . Chronic kidney disease 08/10/2017  . CKD (chronic kidney disease)   . COPD (chronic obstructive pulmonary disease) ([place])   . Diabetes mellitus without complication ([place])   . Edema, peripheral 04/20/2014  . History of kidney stones   . Hypertension   . Iron deficiency anemia 06/22/2014  . Leucocytosis 10/19/2015  . Overactive bladder   . Primary osteoarthritis of right knee 09/01/2016  . Renal insufficiency   . Sciatica 01/07/2015    Surgical History: Past Surgical History:  Procedure Laterality Date  .  APPENDECTOMY    . CESAREAN SECTION     x3  . CHOLECYSTECTOMY    . COLONOSCOPY WITH PROPOFOL N/A 08/28/2017   Procedure: COLONOSCOPY WITH PROPOFOL;  Surgeon: Lucilla Lame, MD;  Location: [facility] ENDOSCOPY;  Service: Endoscopy;  Laterality: N/A;  . COLONOSCOPY WITH PROPOFOL N/A 08/29/2017   Procedure: COLONOSCOPY WITH PROPOFOL;  Surgeon: Lucilla Lame, MD;  Location: [facility] ENDOSCOPY;  Service: Endoscopy;  Laterality: N/A;  . CYSTOSCOPY W/ URETERAL STENT PLACEMENT Right 09/15/2017   Procedure: CYSTOSCOPY WITH RETROGRADE PYELOGRAM/URETERAL STENT PLACEMENT;  Surgeon: Cleon Gustin, MD;  Location: [facility] ORS;  Service: Urology;  Laterality: Right;  . CYSTOSCOPY/URETEROSCOPY/HOLMIUM LASER/STENT PLACEMENT Right 10/09/2017   Procedure: CYSTOSCOPY/URETEROSCOPY/HOLMIUM LASER/STENT PLACEMENT;  Surgeon: Abbie Sons, MD;  Location: [facility] ORS;  Service: Urology;  Laterality: Right;  right Stent exchange  . EYE SURGERY      Home Medications:  Allergies as of 05/13/2018      Reactions   Ace Inhibitors    Gabapentin Hives   Lisinopril    Lyrica [pregabalin]    Shrimp [shellfish Allergy] Swelling   Swelling of the lips      Medication List       Accurate as of May 12, 2018  8:15 PM. Always use your most recent med list.        aspirin 81 MG chewable tablet Chew 81 mg by mouth daily. On hold   atorvastatin 10 MG tablet Commonly known as:  LIPITOR Take 1 tablet by mouth daily.   azithromycin 250 MG tablet Commonly known as:  Zithromax Z-Pak 2 tabs po once day 1, then 1 tab po qd for next 4 days   Breo Ellipta 100-25 MCG/INH Aepb Generic drug:  fluticasone furoate-vilanterol Inhale 1 puff into the lungs daily.   estradiol 0.1 MG/GM vaginal cream Commonly known as:  ESTRACE VAGINAL Apply 0.3m (pea-sized amount)  just inside the vaginal introitus with a finger-tip on Monday, Wednesday and Friday nights.   HumaLOG 100 UNIT/ML injection Generic drug:  insulin lispro Inject 76 Units into the  skin daily. In VGO device   hydrochlorothiazide 25 MG tablet Commonly known as:  HYDRODIURIL Take 25 mg by mouth daily. On hold   insulin pump Soln Inject 1 each into the skin 3 times daily with meals, bedtime and 2 AM.   Invokana 100 MG Tabs tablet Generic drug:  canagliflozin Take 1 tablet by mouth daily.   Iron 325 (65 Fe) MG Tabs Take 1 tablet by mouth daily.   losartan 100 MG tablet Commonly known as:  COZAAR Take 100 mg by mouth daily.   metFORMIN 500 MG tablet Commonly known as:  GLUCOPHAGE Take 1,000 mg by mouth 2 (two) times daily with a meal.   montelukast 10 MG tablet Commonly known as:  SINGULAIR Take 10 mg by mouth at bedtime.   OXYGEN Place 3 L/min into the nose.   predniSONE 20 MG tablet Commonly known as:  DELTASONE 3 tabs po once day 1, then 2 tabs po qd x 2 days, then 1 tab po qd x 2 days, then half a tab po qd x 2 days   solifenacin 10 MG tablet Commonly known as:  VESICARE Take 1 tablet (10 mg total) by mouth daily.   V-Go 40 Kit Inject into the skin as directed.   vitamin B-12 500 MCG tablet Commonly known as:  CYANOCOBALAMIN Take 500 mcg by mouth daily.       Allergies:  Allergies  Allergen Reactions  . Ace Inhibitors   . Gabapentin Hives  . Lisinopril   . Lyrica [Pregabalin]   . Shrimp [Shellfish Allergy] Swelling    Swelling of the lips    Family History: Family History  Problem Relation Age of Onset  . Other Mother        unknown medical history  . Other Father        unknown medical history    Social History:  reports that she quit smoking about 25 years ago. She has never used smokeless tobacco. She reports that she does not drink alcohol or use drugs.  ROS:                                        Physical Exam: There were no vitals taken for this visit.  Constitutional:  Well nourished. Alert and oriented, No acute distress. HEENT: [place] AT, moist mucus membranes.  Trachea midline, no  masses. Cardiovascular: No clubbing, cyanosis, or edema. Respiratory: Normal respiratory effort, no increased work of breathing. GI: Abdomen is soft, non tender, non distended, no abdominal masses. Liver and spleen not palpable.  No hernias appreciated.  Stool sample for occult testing is not indicated.   GU: No CVA tenderness.  No bladder fullness or masses.  *** external genitalia, *** pubic hair distribution, no lesions.  Normal urethral meatus, no lesions, no prolapse, no discharge.   No urethral masses, tenderness and/or tenderness. No bladder fullness, tenderness or masses. *** vagina  mucosa, *** estrogen effect, no discharge, no lesions, *** pelvic support, *** cystocele and *** rectocele noted.  No cervical motion tenderness.  Uterus is freely mobile and non-fixed.  No adnexal/parametria masses or tenderness noted.  Anus and perineum are without rashes or lesions.   ***  Skin: No rashes, bruises or suspicious lesions. Lymph: No cervical or inguinal adenopathy. Neurologic: Grossly intact, no focal deficits, moving all 4 extremities. Psychiatric: Normal mood and affect.   Assessment & Plan:    1. History of nephrolithiasis Hx of calcium oxalate stone  2. Incontinence Incontinence  - offered behavioral therapies  - bladder training  - bladder control strategies  - pelvic floor muscle training  - fluid management   - offered medical therapy with anticholinergic therapy or beta-3 adrenergic receptor agonist and the potential side effects of each therapy ***  - offered refer to gynecology for a pessary fitting ***  - offered an appointment with one of our surgeon for a possible pelvic sling procedure ***  - would like to try the beta-3 adrenergic receptor agonist (Myrbetriq).  Given Myrbetriq 25 mg samples, #28.  I have reviewed with the patient of the side effects of Myrbetriq, such as: elevation in BP, urinary retention and/or HA.  She will return in one month for PVR and symptom  recheck.  ***  - would like to try anticholinergic therapy.  Given Vesicare 70m/10mg, Toviaz 437m8mg samples, # 28.   Advised of the side effects, such as: Dry eyes, dry mouth, constipation, mental confusion and/or urinary retention. ***  - RTC in 3 weeks for PVR and symptom recheck ***       SHZara CouncilPA-C  [address]SuEastmontuAtlantic BeachNC [phone]

## 2018-05-13 ENCOUNTER — Other Ambulatory Visit: Payer: Self-pay

## 2018-05-13 ENCOUNTER — Ambulatory Visit: Payer: Medicare Other | Admitting: Urology

## 2018-05-13 DIAGNOSIS — N201 Calculus of ureter: Secondary | ICD-10-CM

## 2018-05-19 ENCOUNTER — Encounter: Payer: Self-pay | Admitting: Emergency Medicine

## 2018-05-19 ENCOUNTER — Ambulatory Visit
Admission: EM | Admit: 2018-05-19 | Discharge: 2018-05-19 | Disposition: A | Discharge status: Home / Self Care | Payer: Medicare Other | Attending: Family Medicine | Admitting: Family Medicine

## 2018-05-19 ENCOUNTER — Other Ambulatory Visit: Payer: Self-pay

## 2018-05-19 DIAGNOSIS — J441 Chronic obstructive pulmonary disease with (acute) exacerbation: Secondary | ICD-10-CM

## 2018-05-19 MED ORDER — PREDNISONE 50 MG PO TABS: ORAL_TABLET | ORAL | 0 refills | Status: DC

## 2018-05-19 MED ORDER — BENZONATATE 200 MG PO CAPS: 200.0000 mg | ORAL_CAPSULE | Freq: Three times a day (TID) | ORAL | 0 refills | Status: DC | PRN

## 2018-05-19 MED ORDER — AZITHROMYCIN 250 MG PO TABS: ORAL_TABLET | ORAL | 0 refills | Status: DC

## 2018-05-19 NOTE — ED Provider Notes (Signed)
[facility]    CSN: [id number] Arrival date & time: 05/19/18  0947  History   Chief Complaint Chief Complaint  Patient presents with  . Cough   HPI  73 year old female with an extensive past medical history including COPD and chronic respiratory failure presents with cough.  Cough started late last night/early this morning.  Nonproductive.  Denies increasing shortness of breath.  She states that her breathing is at her baseline.  No fever.  No chills.  Patient states that her cough has been severe.  No medications or interventions tried.  No other associated symptoms.  No other complaints.  PMH, Surgical Hx, Family Hx, Social History reviewed and updated as below.  Past Medical History:  Diagnosis Date  . Acute on chronic respiratory failure with hypoxia and hypercapnia ([place]) 01/07/2015  . Anemia   . 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 Asthma   . Cataract   . CHF (congestive heart failure) ([place])   . Chronic kidney disease 08/10/2017  . CKD (chronic kidney disease)   . COPD (chronic obstructive pulmonary disease) ([place])   . Diabetes mellitus without complication ([place])   . Edema, peripheral 04/20/2014  . History of kidney stones   . Hypertension   . Iron deficiency anemia 06/22/2014  . Leucocytosis 10/19/2015  . Overactive bladder   . Primary osteoarthritis of right knee 09/01/2016  . Renal insufficiency   . Sciatica 01/07/2015    Patient Active Problem List   Diagnosis Date Noted  . 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 diabetes mellitus ([place]) 01/25/2018  . History of non anemic vitamin B12 deficiency 01/25/2018  . Personal history of kidney stones 11/12/2017  . Urge incontinence 11/12/2017  . Right ureteral stone 09/15/2017  . Acute GI bleeding   . GI bleed 08/26/2017  . Arthritis 08/10/2017  . Chronic kidney disease 08/10/2017  . COPD (chronic obstructive pulmonary disease) ([place]) 08/10/2017  . Diabetes mellitus type 2, uncomplicated ([place]) 78/29/5621  .  Hypertension 08/10/2017  . Obesity (BMI 35.0-39.9 without comorbidity) 04/11/2017  . Primary osteoarthritis of right knee 09/01/2016  . Leucocytosis 10/19/2015  . 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 Acute on chronic respiratory failure with hypoxia and hypercapnia ([place]) 01/07/2015  . Sciatica 01/07/2015  . Weakness 01/07/2015  . 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 Iron deficiency anemia 06/22/2014  . Microalbuminuria 06/22/2014  . CHF (congestive heart failure) ([place]) 04/20/2014  . Edema, peripheral 04/20/2014    Past Surgical History:  Procedure Laterality Date  . APPENDECTOMY    . CESAREAN SECTION     x3  . CHOLECYSTECTOMY    . COLONOSCOPY WITH PROPOFOL N/A 08/28/2017   Procedure: COLONOSCOPY WITH PROPOFOL;  Surgeon: Lucilla Lame, MD;  Location: [facility] ENDOSCOPY;  Service: Endoscopy;  Laterality: N/A;  . COLONOSCOPY WITH PROPOFOL N/A 08/29/2017   Procedure: COLONOSCOPY WITH PROPOFOL;  Surgeon: Lucilla Lame, MD;  Location: [facility] ENDOSCOPY;  Service: Endoscopy;  Laterality: N/A;  . CYSTOSCOPY W/ URETERAL STENT PLACEMENT Right 09/15/2017   Procedure: CYSTOSCOPY WITH RETROGRADE PYELOGRAM/URETERAL STENT PLACEMENT;  Surgeon: Cleon Gustin, MD;  Location: [facility] ORS;  Service: Urology;  Laterality: Right;  . CYSTOSCOPY/URETEROSCOPY/HOLMIUM LASER/STENT PLACEMENT Right 10/09/2017   Procedure: CYSTOSCOPY/URETEROSCOPY/HOLMIUM LASER/STENT PLACEMENT;  Surgeon: Abbie Sons, MD;  Location: [facility] ORS;  Service: Urology;  Laterality: Right;  right Stent exchange  . EYE SURGERY      OB History   No obstetric history on file.      Home Medications    Prior  to Admission medications   Medication Sig Start Date End Date Taking? Authorizing Provider  aspirin 81 MG chewable tablet Chew 81 mg by mouth daily. On hold   Yes [provider]  atorvastatin (LIPITOR) 10 MG tablet Take 1 tablet by mouth daily.   Yes [provider]  canagliflozin (INVOKANA) 100 MG  TABS tablet Take 1 tablet by mouth daily.   Yes [provider]  estradiol (ESTRACE VAGINAL) 0.1 MG/GM vaginal cream Apply 0.34m (pea-sized amount)  just inside the vaginal introitus with a finger-tip on Monday, Wednesday and Friday nights. 08/31/17  Yes McGowan, SLarene BeachA, PA-C  Ferrous Sulfate (IRON) 325 (65 Fe) MG TABS Take 1 tablet by mouth daily. 09/14/17  Yes [provider]  fluticasone furoate-vilanterol (BREO ELLIPTA) 100-25 MCG/INH AEPB Inhale 1 puff into the lungs daily.   Yes [provider]  HUMALOG 100 UNIT/ML injection Inject 76 Units into the skin daily. In VGO device 08/19/17  Yes [provider]  hydrochlorothiazide (HYDRODIURIL) 25 MG tablet Take 25 mg by mouth daily. On hold   Yes [provider]  Insulin Disposable Pump (V-GO 40) KIT Inject into the skin as directed. 08/14/17  Yes [provider]  Insulin Human (INSULIN PUMP) SOLN Inject 1 each into the skin 3 times daily with meals, bedtime and 2 AM. Patient taking differently: Inject 1 each into the skin 3 times daily with meals, bedtime and 2 AM. Pt uses Novolog 09/18/17  Yes PDustin Flock MD  losartan (COZAAR) 100 MG tablet Take 100 mg by mouth daily. 09/12/17  Yes [provider]  metFORMIN (GLUCOPHAGE) 500 MG tablet Take 1,000 mg by mouth 2 (two) times daily with a meal.    Yes [provider]  montelukast (SINGULAIR) 10 MG tablet Take 10 mg by mouth at bedtime.   Yes [provider]  OXYGEN Place 3 L/min into the nose.    Yes [provider]  solifenacin (VESICARE) 10 MG tablet Take 1 tablet (10 mg total) by mouth daily. 11/12/17  Yes Stoioff, SRonda Fairly MD  vitamin B-12 (CYANOCOBALAMIN) 500 MCG tablet Take 500 mcg by mouth daily.   Yes [provider]  azithromycin (ZITHROMAX) 250 MG tablet 2 tablets on day 1, then 1 tablet daily on days 2-5. 05/19/18   CCoral Spikes DO  benzonatate (TESSALON) 200 MG capsule Take 1 capsule (200 mg  total) by mouth 3 (three) times daily as needed. 05/19/18   CCoral Spikes DO  predniSONE (DELTASONE) 50 MG tablet 1 tablet daily x 5 days 05/19/18   CCoral Spikes DO    Family History Family History  Problem Relation Age of Onset  . Other Mother        unknown medical history  . Other Father        unknown medical history    Social History Social History   Tobacco Use  . Smoking status: Former Smoker    Last attempt to quit: 12/04/1992    Years since quitting: 25.4  . Smokeless tobacco: Never Used  Substance Use Topics  . Alcohol use: No  . Drug use: No     Allergies   Ace inhibitors; Gabapentin; Lisinopril; Lyrica [pregabalin]; and Shrimp [shellfish allergy]   Review of Systems Review of Systems  Constitutional: Negative.   Respiratory: Positive for cough.    Physical Exam Triage Vital Signs ED Triage Vitals  Enc Vitals Group     BP 05/19/18 1001 (!) 145/74  Pulse Rate 05/19/18 1001 78     Resp 05/19/18 1001 20     Temp 05/19/18 1001 98 F (36.7 C)     Temp Source 05/19/18 1001 Oral     SpO2 05/19/18 1001 97 %     Weight 05/19/18 0958 222 lb (100.7 kg)     Height 05/19/18 0958 '5\' 3"'  (1.6 m)     Head Circumference --      Peak Flow --      Pain Score 05/19/18 0958 0     Pain Loc --      Pain Edu? --      Excl. in [place]? --    Updated Vital Signs BP (!) 145/74 (BP Location: Left Arm)   Pulse 78   Temp 98 F (36.7 C) (Oral)   Resp 20   Ht '5\' 3"'  (1.6 m)   Wt 100.7 kg   SpO2 97%   BMI 39.33 kg/m   Visual Acuity Right Eye Distance:   Left Eye Distance:   Bilateral Distance:    Right Eye Near:   Left Eye Near:    Bilateral Near:     Physical Exam Vitals signs and nursing note reviewed.  Constitutional:      General: She is not in acute distress.    Appearance: She is obese.  HENT:     Head: Normocephalic and atraumatic.  Eyes:     General:        Right eye: No discharge.        Left eye: No discharge.     Conjunctiva/sclera:  Conjunctivae normal.  Cardiovascular:     Rate and Rhythm: Normal rate and regular rhythm.  Pulmonary:     Effort: Pulmonary effort is normal.     Breath sounds: No wheezing or rales.  Neurological:     Mental Status: She is alert.  Psychiatric:        Mood and Affect: Mood normal.        Behavior: Behavior normal.    UC Treatments / Results  Labs (all labs ordered are listed, but only abnormal results are displayed) Labs Reviewed - No data to display  EKG None  Radiology No results found.  Procedures Procedures (including critical care time)  Medications Ordered in UC Medications - No data to display  Initial Impression / Assessment and Plan / UC Course  I have reviewed the triage vital signs and the nursing notes.  Pertinent labs & imaging results that were available during my care of the patient were reviewed by me and considered in my medical decision making (see chart for details).    73 year old female presents with COPD exacerbation.  At this point time I think that she can continue her home medications and use Tessalon Perles for cough.  Sending prescriptions for azithromycin and prednisone to be filled if she fails to improve or worsens.  Final Clinical Impressions(s) / UC Diagnoses   Final diagnoses:  COPD exacerbation ([place])     Discharge Instructions     Cough medication as prescribed.  Start the steroids and antibiotic if symptoms persist.  Take care  Dr. Lacinda Axon    ED Prescriptions    Medication Sig Dispense Auth. Provider   benzonatate (TESSALON) 200 MG capsule Take 1 capsule (200 mg total) by mouth 3 (three) times daily as needed. 30 capsule Shakirah Kirkey G, DO   azithromycin (ZITHROMAX) 250 MG tablet 2 tablets on day 1, then 1 tablet daily on days 2-5. 6  tablet [place], Shannia Jacuinde G, DO   predniSONE (DELTASONE) 50 MG tablet 1 tablet daily x 5 days 5 tablet Coral Spikes, DO     Controlled Substance Prescriptions [place] Controlled Substance Registry  consulted? Not Applicable   Coral Spikes, DO 05/19/18 1038

## 2018-05-19 NOTE — ED Triage Notes (Signed)
Pt c/o cough. Started this morning. Pt has h/o COPD. Denies fever, chills, no more SOB than normal.

## 2018-05-19 NOTE — Discharge Instructions (Signed)
Cough medication as prescribed.  Start the steroids and antibiotic if symptoms persist.  Take care  Dr. Lacinda Axon

## 2018-06-25 ENCOUNTER — Encounter: Payer: Self-pay | Admitting: Intensive Care

## 2018-06-25 ENCOUNTER — Ambulatory Visit (INDEPENDENT_AMBULATORY_CARE_PROVIDER_SITE_OTHER)
Admission: EM | Admit: 2018-06-25 | Discharge: 2018-06-25 | Disposition: A | Discharge status: Other Acute Inpatient Hospital | Payer: Medicare Other | Source: Home / Self Care | Attending: Family Medicine | Admitting: Family Medicine

## 2018-06-25 ENCOUNTER — Inpatient Hospital Stay
Admission: EM | Admit: 2018-06-25 | Discharge: 2018-06-30 | DRG: 291 | Disposition: A | Discharge status: Home Health | Payer: Medicare Other | Attending: Internal Medicine | Admitting: Internal Medicine

## 2018-06-25 ENCOUNTER — Other Ambulatory Visit: Payer: Self-pay | Admitting: *Deleted

## 2018-06-25 ENCOUNTER — Emergency Department: Payer: Medicare Other

## 2018-06-25 ENCOUNTER — Other Ambulatory Visit: Payer: Self-pay

## 2018-06-25 ENCOUNTER — Inpatient Hospital Stay
Admit: 2018-06-25 | Discharge: 2018-06-25 | Disposition: A | Discharge status: Home / Self Care | Payer: Medicare Other | Attending: Internal Medicine | Admitting: Internal Medicine

## 2018-06-25 ENCOUNTER — Encounter: Payer: Self-pay | Admitting: Emergency Medicine

## 2018-06-25 DIAGNOSIS — R112 Nausea with vomiting, unspecified: Secondary | ICD-10-CM | POA: Diagnosis not present

## 2018-06-25 DIAGNOSIS — D631 Anemia in chronic kidney disease: Secondary | ICD-10-CM | POA: Diagnosis present

## 2018-06-25 DIAGNOSIS — Z6841 Body Mass Index (BMI) 40.0 and over, adult: Secondary | ICD-10-CM

## 2018-06-25 DIAGNOSIS — I272 Pulmonary hypertension, unspecified: Secondary | ICD-10-CM | POA: Diagnosis present

## 2018-06-25 DIAGNOSIS — E785 Hyperlipidemia, unspecified: Secondary | ICD-10-CM | POA: Diagnosis present

## 2018-06-25 DIAGNOSIS — I5033 Acute on chronic diastolic (congestive) heart failure: Secondary | ICD-10-CM | POA: Diagnosis present

## 2018-06-25 DIAGNOSIS — N200 Calculus of kidney: Secondary | ICD-10-CM | POA: Diagnosis present

## 2018-06-25 DIAGNOSIS — N184 Chronic kidney disease, stage 4 (severe): Secondary | ICD-10-CM | POA: Diagnosis present

## 2018-06-25 DIAGNOSIS — J9621 Acute and chronic respiratory failure with hypoxia: Secondary | ICD-10-CM | POA: Diagnosis present

## 2018-06-25 DIAGNOSIS — Z794 Long term (current) use of insulin: Secondary | ICD-10-CM | POA: Diagnosis not present

## 2018-06-25 DIAGNOSIS — Z7982 Long term (current) use of aspirin: Secondary | ICD-10-CM | POA: Diagnosis not present

## 2018-06-25 DIAGNOSIS — J9620 Acute and chronic respiratory failure, unspecified whether with hypoxia or hypercapnia: Secondary | ICD-10-CM | POA: Diagnosis present

## 2018-06-25 DIAGNOSIS — Z9641 Presence of insulin pump (external) (internal): Secondary | ICD-10-CM | POA: Diagnosis present

## 2018-06-25 DIAGNOSIS — R0602 Shortness of breath: Secondary | ICD-10-CM | POA: Diagnosis present

## 2018-06-25 DIAGNOSIS — Z888 Allergy status to other drugs, medicaments and biological substances status: Secondary | ICD-10-CM | POA: Diagnosis not present

## 2018-06-25 DIAGNOSIS — E119 Type 2 diabetes mellitus without complications: Secondary | ICD-10-CM

## 2018-06-25 DIAGNOSIS — I509 Heart failure, unspecified: Secondary | ICD-10-CM

## 2018-06-25 DIAGNOSIS — B9620 Unspecified Escherichia coli [E. coli] as the cause of diseases classified elsewhere: Secondary | ICD-10-CM | POA: Diagnosis present

## 2018-06-25 DIAGNOSIS — E1142 Type 2 diabetes mellitus with diabetic polyneuropathy: Secondary | ICD-10-CM | POA: Diagnosis present

## 2018-06-25 DIAGNOSIS — Z20828 Contact with and (suspected) exposure to other viral communicable diseases: Secondary | ICD-10-CM | POA: Diagnosis present

## 2018-06-25 DIAGNOSIS — M1711 Unilateral primary osteoarthritis, right knee: Secondary | ICD-10-CM | POA: Diagnosis present

## 2018-06-25 DIAGNOSIS — Z87891 Personal history of nicotine dependence: Secondary | ICD-10-CM

## 2018-06-25 DIAGNOSIS — Z7951 Long term (current) use of inhaled steroids: Secondary | ICD-10-CM | POA: Diagnosis not present

## 2018-06-25 DIAGNOSIS — R600 Localized edema: Secondary | ICD-10-CM

## 2018-06-25 DIAGNOSIS — E1122 Type 2 diabetes mellitus with diabetic chronic kidney disease: Secondary | ICD-10-CM | POA: Diagnosis present

## 2018-06-25 DIAGNOSIS — I081 Rheumatic disorders of both mitral and tricuspid valves: Secondary | ICD-10-CM | POA: Diagnosis present

## 2018-06-25 DIAGNOSIS — I130 Hypertensive heart and chronic kidney disease with heart failure and stage 1 through stage 4 chronic kidney disease, or unspecified chronic kidney disease: Principal | ICD-10-CM | POA: Diagnosis present

## 2018-06-25 DIAGNOSIS — E538 Deficiency of other specified B group vitamins: Secondary | ICD-10-CM | POA: Diagnosis present

## 2018-06-25 DIAGNOSIS — Z9981 Dependence on supplemental oxygen: Secondary | ICD-10-CM

## 2018-06-25 DIAGNOSIS — Z91013 Allergy to seafood: Secondary | ICD-10-CM

## 2018-06-25 DIAGNOSIS — E876 Hypokalemia: Secondary | ICD-10-CM | POA: Diagnosis not present

## 2018-06-25 DIAGNOSIS — N183 Chronic kidney disease, stage 3 (moderate): Secondary | ICD-10-CM | POA: Diagnosis not present

## 2018-06-25 DIAGNOSIS — I5043 Acute on chronic combined systolic (congestive) and diastolic (congestive) heart failure: Secondary | ICD-10-CM

## 2018-06-25 DIAGNOSIS — J441 Chronic obstructive pulmonary disease with (acute) exacerbation: Secondary | ICD-10-CM | POA: Diagnosis present

## 2018-06-25 DIAGNOSIS — N390 Urinary tract infection, site not specified: Secondary | ICD-10-CM | POA: Diagnosis present

## 2018-06-25 DIAGNOSIS — J432 Centrilobular emphysema: Secondary | ICD-10-CM | POA: Diagnosis not present

## 2018-06-25 HISTORY — DX: Hyperlipidemia, unspecified: E78.5

## 2018-06-25 HISTORY — DX: Unspecified diastolic (congestive) heart failure: I50.30

## 2018-06-25 LAB — CBC WITH DIFFERENTIAL/PLATELET
Abs Immature Granulocytes: 0.09 10*3/uL — ABNORMAL HIGH (ref 0.00–0.07)
Basophils Absolute: 0.1 10*3/uL (ref 0.0–0.1)
Basophils Relative: 1 %
Eosinophils Absolute: 0.2 10*3/uL (ref 0.0–0.5)
Eosinophils Relative: 2 %
HCT: 34 % — ABNORMAL LOW (ref 36.0–46.0)
Hemoglobin: 10.4 g/dL — ABNORMAL LOW (ref 12.0–15.0)
Immature Granulocytes: 1 %
Lymphocytes Relative: 12 %
Lymphs Abs: 1.2 10*3/uL (ref 0.7–4.0)
MCH: 28.1 pg (ref 26.0–34.0)
MCHC: 30.6 g/dL (ref 30.0–36.0)
MCV: 91.9 fL (ref 80.0–100.0)
Monocytes Absolute: 0.7 10*3/uL (ref 0.1–1.0)
Monocytes Relative: 7 %
Neutro Abs: 7.8 10*3/uL — ABNORMAL HIGH (ref 1.7–7.7)
Neutrophils Relative %: 77 %
Platelets: 156 10*3/uL (ref 150–400)
RBC: 3.7 MIL/uL — ABNORMAL LOW (ref 3.87–5.11)
RDW: 16 % — ABNORMAL HIGH (ref 11.5–15.5)
WBC: 10.1 10*3/uL (ref 4.0–10.5)
nRBC: 0 % (ref 0.0–0.2)

## 2018-06-25 LAB — COMPREHENSIVE METABOLIC PANEL
ALT: 13 U/L (ref 0–44)
AST: 22 U/L (ref 15–41)
Albumin: 3.5 g/dL (ref 3.5–5.0)
Alkaline Phosphatase: 68 U/L (ref 38–126)
Anion gap: 13 (ref 5–15)
BUN: 20 mg/dL (ref 8–23)
CO2: 27 mmol/L (ref 22–32)
Calcium: 8.9 mg/dL (ref 8.9–10.3)
Chloride: 97 mmol/L — ABNORMAL LOW (ref 98–111)
Creatinine, Ser: 0.8 mg/dL (ref 0.44–1.00)
GFR calc Af Amer: >60 mL/min (ref >60)
GFR calc non Af Amer: >60 mL/min (ref >60)
Glucose, Bld: 266 mg/dL — ABNORMAL HIGH (ref 70–99)
Potassium: 4.8 mmol/L (ref 3.5–5.1)
Sodium: 137 mmol/L (ref 135–145)
Total Bilirubin: 0.4 mg/dL (ref 0.3–1.2)
Total Protein: 6.6 g/dL (ref 6.5–8.1)

## 2018-06-25 LAB — URINALYSIS, COMPLETE (UACMP) WITH MICROSCOPIC
Bilirubin Urine: NEGATIVE
Glucose, UA: >=500 mg/dL — AB
Hgb urine dipstick: NEGATIVE
Ketones, ur: NEGATIVE mg/dL
Nitrite: POSITIVE — AB
Protein, ur: NEGATIVE mg/dL
Specific Gravity, Urine: 1.024 (ref 1.005–1.030)
pH: 5 (ref 5.0–8.0)

## 2018-06-25 LAB — TROPONIN I
Troponin I: <0.03 ng/mL (ref <0.03)
Troponin I: <0.03 ng/mL (ref <0.03)
Troponin I: <0.03 ng/mL (ref <0.03)

## 2018-06-25 LAB — GLUCOSE, CAPILLARY
Glucose-Capillary: 81 mg/dL (ref 70–99)
Glucose-Capillary: 98 mg/dL (ref 70–99)

## 2018-06-25 LAB — BRAIN NATRIURETIC PEPTIDE: B Natriuretic Peptide: 150 pg/mL — ABNORMAL HIGH (ref 0.0–100.0)

## 2018-06-25 LAB — SARS CORONAVIRUS 2 BY RT PCR (HOSPITAL ORDER, PERFORMED IN ~~LOC~~ HOSPITAL LAB): SARS Coronavirus 2: NEGATIVE

## 2018-06-25 MED ORDER — LOSARTAN POTASSIUM 50 MG PO TABS
100.0000 mg | ORAL_TABLET | Freq: Every day | ORAL | Status: DC
  Administered 2018-06-26 – 2018-06-30 (×5): 100 mg via ORAL
  Filled 2018-06-25 (×5): qty 2

## 2018-06-25 MED ORDER — INSULIN ASPART 100 UNIT/ML ~~LOC~~ SOLN: 0.0000 [IU] | Freq: Three times a day (TID) | SUBCUTANEOUS | Status: DC

## 2018-06-25 MED ORDER — PERFLUTREN LIPID MICROSPHERE
1.0000 mL | INTRAVENOUS | Status: AC | PRN
  Administered 2018-06-25: 2 mL via INTRAVENOUS
  Filled 2018-06-25: qty 10

## 2018-06-25 MED ORDER — BISACODYL 5 MG PO TBEC: 5.0000 mg | DELAYED_RELEASE_TABLET | Freq: Every day | ORAL | Status: DC | PRN

## 2018-06-25 MED ORDER — SODIUM CHLORIDE 0.9 % IV SOLN
1.0000 g | INTRAVENOUS | Status: DC
  Administered 2018-06-26: 1 g via INTRAVENOUS
  Filled 2018-06-25: qty 1
  Filled 2018-06-25: qty 10

## 2018-06-25 MED ORDER — IPRATROPIUM-ALBUTEROL 0.5-2.5 (3) MG/3ML IN SOLN
3.0000 mL | RESPIRATORY_TRACT | Status: DC
  Administered 2018-06-25 – 2018-06-27 (×9): 3 mL via RESPIRATORY_TRACT
  Filled 2018-06-25 (×9): qty 3

## 2018-06-25 MED ORDER — HYDROCHLOROTHIAZIDE 25 MG PO TABS: 25.0000 mg | ORAL_TABLET | Freq: Every day | ORAL | Status: DC

## 2018-06-25 MED ORDER — ASPIRIN 81 MG PO CHEW
81.0000 mg | CHEWABLE_TABLET | Freq: Every day | ORAL | Status: DC
  Administered 2018-06-26 – 2018-06-30 (×5): 81 mg via ORAL
  Filled 2018-06-25 (×5): qty 1

## 2018-06-25 MED ORDER — SODIUM CHLORIDE 0.9 % IV SOLN
1.0000 g | Freq: Once | INTRAVENOUS | Status: AC
  Administered 2018-06-25: 1 g via INTRAVENOUS
  Filled 2018-06-25: qty 10

## 2018-06-25 MED ORDER — ACETAMINOPHEN 325 MG PO TABS
650.0000 mg | ORAL_TABLET | Freq: Four times a day (QID) | ORAL | Status: DC | PRN
  Administered 2018-06-26 – 2018-06-29 (×4): 650 mg via ORAL
  Filled 2018-06-25 (×4): qty 2

## 2018-06-25 MED ORDER — CANAGLIFLOZIN 100 MG PO TABS
100.0000 mg | ORAL_TABLET | Freq: Every day | ORAL | Status: DC
  Filled 2018-06-25 (×2): qty 1

## 2018-06-25 MED ORDER — DOCUSATE SODIUM 100 MG PO CAPS
100.0000 mg | ORAL_CAPSULE | Freq: Two times a day (BID) | ORAL | Status: DC
  Administered 2018-06-26 – 2018-06-30 (×7): 100 mg via ORAL
  Filled 2018-06-25 (×10): qty 1

## 2018-06-25 MED ORDER — TRAZODONE HCL 50 MG PO TABS
25.0000 mg | ORAL_TABLET | Freq: Every evening | ORAL | Status: DC | PRN
  Administered 2018-06-28 – 2018-06-29 (×2): 25 mg via ORAL
  Filled 2018-06-25 (×2): qty 1

## 2018-06-25 MED ORDER — ATORVASTATIN CALCIUM 10 MG PO TABS
10.0000 mg | ORAL_TABLET | Freq: Every day | ORAL | Status: DC
  Administered 2018-06-26 – 2018-06-30 (×5): 10 mg via ORAL
  Filled 2018-06-25 (×5): qty 1

## 2018-06-25 MED ORDER — HEPARIN SODIUM (PORCINE) 5000 UNIT/ML IJ SOLN
5000.0000 [IU] | Freq: Three times a day (TID) | INTRAMUSCULAR | Status: DC
  Administered 2018-06-25 – 2018-06-27 (×5): 5000 [IU] via SUBCUTANEOUS
  Filled 2018-06-25 (×5): qty 1

## 2018-06-25 MED ORDER — FUROSEMIDE 10 MG/ML IJ SOLN
40.0000 mg | Freq: Once | INTRAMUSCULAR | Status: AC
  Administered 2018-06-25: 13:00:00 40 mg via INTRAVENOUS
  Filled 2018-06-25: qty 4

## 2018-06-25 MED ORDER — SODIUM CHLORIDE 0.9% FLUSH
3.0000 mL | Freq: Two times a day (BID) | INTRAVENOUS | Status: DC
  Administered 2018-06-25 – 2018-06-29 (×10): 3 mL via INTRAVENOUS

## 2018-06-25 MED ORDER — ACETAMINOPHEN 650 MG RE SUPP: 650.0000 mg | Freq: Four times a day (QID) | RECTAL | Status: DC | PRN

## 2018-06-25 MED ORDER — ONDANSETRON HCL 4 MG PO TABS: 4.0000 mg | ORAL_TABLET | Freq: Four times a day (QID) | ORAL | Status: DC | PRN

## 2018-06-25 MED ORDER — ONDANSETRON HCL 4 MG/2ML IJ SOLN
4.0000 mg | Freq: Four times a day (QID) | INTRAMUSCULAR | Status: DC | PRN
  Administered 2018-06-26 – 2018-06-27 (×3): 4 mg via INTRAVENOUS
  Filled 2018-06-25 (×3): qty 2

## 2018-06-25 MED ORDER — FUROSEMIDE 10 MG/ML IJ SOLN
40.0000 mg | Freq: Two times a day (BID) | INTRAMUSCULAR | Status: DC
  Administered 2018-06-26: 40 mg via INTRAVENOUS
  Filled 2018-06-25: qty 4

## 2018-06-25 MED ORDER — INSULIN PUMP
Freq: Three times a day (TID) | SUBCUTANEOUS | Status: DC
  Administered 2018-06-25: 17:00:00 via SUBCUTANEOUS
  Administered 2018-06-25: 3 via SUBCUTANEOUS
  Administered 2018-06-26 – 2018-06-27 (×5): via SUBCUTANEOUS
  Administered 2018-06-27 – 2018-06-28 (×4): 5 via SUBCUTANEOUS
  Administered 2018-06-28: 3 via SUBCUTANEOUS
  Administered 2018-06-28: 18:00:00 5 via SUBCUTANEOUS
  Administered 2018-06-28 – 2018-06-30 (×6): via SUBCUTANEOUS

## 2018-06-25 NOTE — ED Notes (Signed)
Patient given water and told to take small sips per MD

## 2018-06-25 NOTE — ED Provider Notes (Signed)
[facility] Emergency Department Provider Note  ____________________________________________  Time seen: Approximately 10:00 AM  I have reviewed the triage vital signs and the nursing notes.   HISTORY  Chief Complaint Shortness of Breath    HPI Karen [name] is a 73 y.o. female with a history of CKD, COPD, diabetes, hypertension, CHF on 3 L nasal cannula at home who is sent to the ED from urgent care due to shortness of breath worsening for the past week, gradual onset.  Associated with periods of chest heaviness centrally that is nonradiating.  She has dyspnea on exertion and orthopnea.  She feels much more comfortable sitting upright rather than lying down.  No exertional chest pain.  She is noticed pronounced increase in her peripheral edema.  She does not take any diuretics at home.   Urgent care found that patient oxygen saturation was 88% on her usual 3 L nasal cannula, increased oxygen requirement.  Usually she is at 94% at home.   Past Medical History:  Diagnosis Date  . Acute on chronic respiratory failure with hypoxia and hypercapnia ([place]) 01/07/2015  . Anemia   . 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 Asthma   . Cataract   . CHF (congestive heart failure) ([place])   . Chronic kidney disease 08/10/2017  . CKD (chronic kidney disease)   . COPD (chronic obstructive pulmonary disease) ([place])   . Diabetes mellitus without complication ([place])   . Edema, peripheral 04/20/2014  . History of kidney stones   . Hypertension   . Iron deficiency anemia 06/22/2014  . Leucocytosis 10/19/2015  . Overactive bladder   . Primary osteoarthritis of right knee 09/01/2016  . Renal insufficiency   . Sciatica 01/07/2015     Patient Active Problem List   Diagnosis Date Noted  . 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 diabetes mellitus ([place]) 01/25/2018  . History of non anemic vitamin B12 deficiency 01/25/2018  . Personal history of kidney stones 11/12/2017  . Urge  incontinence 11/12/2017  . Right ureteral stone 09/15/2017  . Acute GI bleeding   . GI bleed 08/26/2017  . Arthritis 08/10/2017  . Chronic kidney disease 08/10/2017  . COPD (chronic obstructive pulmonary disease) ([place]) 08/10/2017  . Diabetes mellitus type 2, uncomplicated ([place]) 86/75/4492  . Hypertension 08/10/2017  . Obesity (BMI 35.0-39.9 without comorbidity) 04/11/2017  . Primary osteoarthritis of right knee 09/01/2016  . Leucocytosis 10/19/2015  . 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 Acute on chronic respiratory failure with hypoxia and hypercapnia ([place]) 01/07/2015  . Sciatica 01/07/2015  . Weakness 01/07/2015  . 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 Iron deficiency anemia 06/22/2014  . Microalbuminuria 06/22/2014  . CHF (congestive heart failure) ([place]) 04/20/2014  . Edema, peripheral 04/20/2014     Past Surgical History:  Procedure Laterality Date  . APPENDECTOMY    . CESAREAN SECTION     x3  . CHOLECYSTECTOMY    . COLONOSCOPY WITH PROPOFOL N/A 08/28/2017   Procedure: COLONOSCOPY WITH PROPOFOL;  Surgeon: Lucilla Lame, MD;  Location: [facility] ENDOSCOPY;  Service: Endoscopy;  Laterality: N/A;  . COLONOSCOPY WITH PROPOFOL N/A 08/29/2017   Procedure: COLONOSCOPY WITH PROPOFOL;  Surgeon: Lucilla Lame, MD;  Location: [facility] ENDOSCOPY;  Service: Endoscopy;  Laterality: N/A;  . CYSTOSCOPY W/ URETERAL STENT PLACEMENT Right 09/15/2017   Procedure: CYSTOSCOPY WITH RETROGRADE PYELOGRAM/URETERAL STENT PLACEMENT;  Surgeon: Cleon Gustin, MD;  Location: [facility] ORS;  Service: Urology;  Laterality: Right;  . CYSTOSCOPY/URETEROSCOPY/HOLMIUM LASER/STENT PLACEMENT Right 10/09/2017  Procedure: CYSTOSCOPY/URETEROSCOPY/HOLMIUM LASER/STENT PLACEMENT;  Surgeon: Abbie Sons, MD;  Location: [facility] ORS;  Service: Urology;  Laterality: Right;  right Stent exchange  . EYE SURGERY       Prior to Admission medications   Medication Sig Start Date End Date Taking? Authorizing Provider   aspirin 81 MG chewable tablet Chew 81 mg by mouth daily. On hold    [provider]  atorvastatin (LIPITOR) 10 MG tablet Take 1 tablet by mouth daily.    [provider]  azithromycin (ZITHROMAX) 250 MG tablet 2 tablets on day 1, then 1 tablet daily on days 2-5. 05/19/18   Coral Spikes, DO  benzonatate (TESSALON) 200 MG capsule Take 1 capsule (200 mg total) by mouth 3 (three) times daily as needed. 05/19/18   Coral Spikes, DO  canagliflozin (INVOKANA) 100 MG TABS tablet Take 1 tablet by mouth daily.    [provider]  estradiol (ESTRACE VAGINAL) 0.1 MG/GM vaginal cream Apply 0.32m (pea-sized amount)  just inside the vaginal introitus with a finger-tip on Monday, Wednesday and Friday nights. 08/31/17   McGowan, SHunt Oris PA-C  Ferrous Sulfate (IRON) 325 (65 Fe) MG TABS Take 1 tablet by mouth daily. 09/14/17   [provider]  fluticasone furoate-vilanterol (BREO ELLIPTA) 100-25 MCG/INH AEPB Inhale 1 puff into the lungs daily.    [provider]  HUMALOG 100 UNIT/ML injection Inject 76 Units into the skin daily. In VGO device 08/19/17   [provider]  hydrochlorothiazide (HYDRODIURIL) 25 MG tablet Take 25 mg by mouth daily. On hold    [provider]  Insulin Disposable Pump (V-GO 40) KIT Inject into the skin as directed. 08/14/17   [provider]  Insulin Human (INSULIN PUMP) SOLN Inject 1 each into the skin 3 times daily with meals, bedtime and 2 AM. Patient taking differently: Inject 1 each into the skin 3 times daily with meals, bedtime and 2 AM. Pt uses Novolog 09/18/17   PDustin Flock MD  losartan (COZAAR) 100 MG tablet Take 100 mg by mouth daily. 09/12/17   [provider]  metFORMIN (GLUCOPHAGE) 500 MG tablet Take 1,000 mg by mouth 2 (two) times daily with a meal.     [provider]  montelukast (SINGULAIR) 10 MG tablet Take 10 mg by mouth at bedtime.    [provider]  OXYGEN Place 3 L/min  into the nose.     [provider]  predniSONE (DELTASONE) 50 MG tablet 1 tablet daily x 5 days 05/19/18   CCoral Spikes DO  solifenacin (VESICARE) 10 MG tablet Take 1 tablet (10 mg total) by mouth daily. 11/12/17   Stoioff, SRonda Fairly MD  vitamin B-12 (CYANOCOBALAMIN) 500 MCG tablet Take 500 mcg by mouth daily.    [provider]     Allergies Ace inhibitors; Gabapentin; Lisinopril; Lyrica [pregabalin]; and Shrimp [shellfish allergy]   Family History  Problem Relation Age of Onset  . Other Mother        unknown medical history  . Other Father        unknown medical history    Social History Social History   Tobacco Use  . Smoking status: Former Smoker    Last attempt to quit: 12/04/1992    Years since quitting: 25.5  . Smokeless tobacco: Never Used  Substance Use Topics  . Alcohol use: No  . Drug use: No    Review of Systems  Constitutional:   No fever or chills.  ENT:   No sore throat. No rhinorrhea. Cardiovascular:   Positive as above chest pain without syncope. Respiratory: Positive shortness of breath and occasional nonproductive  cough. Gastrointestinal:   Negative for abdominal pain, vomiting and diarrhea.  Musculoskeletal:   Positive bilateral lower extremity swelling All other systems reviewed and are negative except as documented above in ROS and HPI.  ____________________________________________   PHYSICAL EXAM:  VITAL SIGNS: ED Triage Vitals  Enc Vitals Group     BP 06/25/18 0951 (!) 150/85     Pulse Rate 06/25/18 0951 68     Resp 06/25/18 0951 (!) 24     Temp 06/25/18 0951 98 F (36.7 C)     Temp Source 06/25/18 0951 Oral     SpO2 06/25/18 0951 98 %     Weight 06/25/18 0948 224 lb (101.6 kg)     Height 06/25/18 0948 _0  (1.6 m)     Head Circumference --      Peak Flow --      Pain Score 06/25/18 0948 8     Pain Loc --      Pain Edu? --      Excl. in [place]? --     Vital signs reviewed, nursing assessments  reviewed.   Constitutional:   Alert and oriented. Non-toxic appearance. Eyes:   Conjunctivae are normal. EOMI. PERRL. ENT      Head:   Normocephalic and atraumatic.      Nose:   No congestion/rhinnorhea.       Mouth/Throat:   MMM, no pharyngeal erythema. No peritonsillar mass.       Neck:   No meningismus. Full ROM. Hematological/Lymphatic/Immunilogical:   No cervical lymphadenopathy. Cardiovascular:   RRR. Symmetric bilateral radial and DP pulses.  No murmurs. Cap refill less than 2 seconds. Respiratory:   Normal respiratory effort without tachypnea/retractions.  Diminished breath sounds at bilateral bases.  No focal wheeze or crackles. Gastrointestinal:   Soft and nontender. Non distended. There is no CVA tenderness.  No rebound, rigidity, or guarding.  Musculoskeletal:   Normal range of motion in all extremities. No joint effusions.  No lower extremity tenderness.  2+ pitting edema bilateral lower extremities. Neurologic:   Normal speech and language.  Motor grossly intact. No acute focal neurologic deficits are appreciated.  Skin:    Skin is warm, dry and intact. No rash noted.  No petechiae, purpura, or bullae.  ____________________________________________    LABS (pertinent positives/negatives) (all labs ordered are listed, but only abnormal results are displayed) Labs Reviewed  COMPREHENSIVE METABOLIC PANEL - Abnormal; Notable for the following components:      Result Value   Chloride 97 (*)    Glucose, Bld 266 (*)    All other components within normal limits  CBC WITH DIFFERENTIAL/PLATELET - Abnormal; Notable for the following components:   RBC 3.70 (*)    Hemoglobin 10.4 (*)    HCT 34.0 (*)    RDW 16.0 (*)    Neutro Abs 7.8 (*)    Abs Immature Granulocytes 0.09 (*)    All other components within normal limits  BRAIN NATRIURETIC PEPTIDE - Abnormal; Notable for the following components:   B Natriuretic Peptide 150.0 (*)    All other components within normal limits   URINALYSIS, COMPLETE (UACMP) WITH MICROSCOPIC - Abnormal; Notable for the following components:   Color, Urine YELLOW (*)    APPearance HAZY (*)    Glucose, UA >=500 (*)    Nitrite POSITIVE (*)  Leukocytes,Ua TRACE (*)    Bacteria, UA MANY (*)    All other components within normal limits  SARS CORONAVIRUS 2 (HOSPITAL ORDER, [place] [facility])  URINE CULTURE  TROPONIN I   ____________________________________________   EKG  Interpreted by me  Date: 06/25/2018  Rate: 62  Rhythm: normal sinus rhythm  QRS Axis: normal  Intervals: normal  ST/T Wave abnormalities: normal  Conduction Disutrbances: none  Narrative Interpretation: unremarkable      ____________________________________________    RADIOLOGY  Dg Chest Portable 1 View  Result Date: 06/25/2018 CLINICAL DATA:  73 year old with acute onset of shortness of breath and nonproductive cough that began this morning, associated with BILATERAL LOWER extremity edema. Hypoxemia with oxygen saturations in UPPER 80s. Current history of oxygen dependent COPD. EXAM: PORTABLE CHEST 1 VIEW COMPARISON:  02/04/2018 and earlier, including CTA chest 06/23/2016. FINDINGS: Suboptimal inspiration accounts for crowded bronchovascular markings, especially in the bases, and accentuates the cardiac silhouette. Taking this into account, cardiac silhouette mildly to moderately enlarged, unchanged. Pulmonary venous hypertension without overt edema. Lungs clear. No confluent or patchy airspace consolidation. No pleural effusions. IMPRESSION: Suboptimal inspiration. Stable cardiomegaly. Pulmonary venous hypertension without overt edema. No acute cardiopulmonary disease. Electronically Signed   By: Evangeline Dakin M.D.   On: 06/25/2018 10:13    ____________________________________________   PROCEDURES Procedures  ____________________________________________  DIFFERENTIAL DIAGNOSIS   CHF exacerbation, COPD exacerbation,  pneumonia, pleural effusion, pulmonary edema, COVID-19.  Doubt ACS PE dissection pericarditis AAA or pneumothorax.  CLINICAL IMPRESSION / ASSESSMENT AND PLAN / ED COURSE  Medications ordered in the ED: Medications  furosemide (LASIX) injection 40 mg (has no administration in time range)  cefTRIAXone (ROCEPHIN) 1 g in sodium chloride 0.9 % 100 mL IVPB (has no administration in time range)    Pertinent labs & imaging results that were available during my care of the patient were reviewed by me and considered in my medical decision making (see chart for details).  Karen [name] was evaluated in Emergency Department on 06/25/2018 for the symptoms described in the history of present illness. She was evaluated in the context of the global COVID-19 pandemic, which necessitated consideration that the patient might be at risk for infection with the SARS-CoV-2 virus that causes COVID-19. Institutional protocols and algorithms that pertain to the evaluation of patients at risk for COVID-19 are in a state of rapid change based on information released by regulatory bodies including the CDC and federal and state organizations. These policies and algorithms were followed during the patient's care in the ED.     Clinical Course as of Jun 25 1246  Tue Jun 25, 2018  0956 Patient presents with acute hypoxic respiratory failure peripheral edema dyspnea on exertion orthopnea.  Most likely congestive heart failure exacerbation.  Low suspicion for COVID.  Patient not septic.  Will obtain a chest x-ray, labs, maintain normoxia with nasal cannula 3-liters.   [PS]    Clinical Course User Index [PS] Carrie Mew, MD     ----------------------------------------- 12:48 PM on 06/25/2018 -----------------------------------------  Chest x-ray unremarkable but poor technique.  Labs overall unremarkable with stable CKD.  She does have a UTI for which I have given her ceftriaxone and ordered a urine culture.  IV  Lasix ordered for initial treatment of her CHF exacerbation based on clinical evaluation.  COVID negative  ____________________________________________   FINAL CLINICAL IMPRESSION(S) / ED DIAGNOSES    Final diagnoses:  Type 2 diabetes mellitus without complication, with long-term current use  of insulin ([place])  Stage 4 chronic kidney disease ([place])  Acute on chronic respiratory failure with hypoxia ([facility])  Acute on chronic congestive heart failure, unspecified heart failure type [facility])     ED Discharge Orders    None      Portions of this note were generated with dragon dictation software. Dictation errors may occur despite best attempts at proofreading.   Carrie Mew, MD 06/25/18 1249

## 2018-06-25 NOTE — Progress Notes (Signed)
Inpatient Diabetes Program Recommendations  AACE/ADA: New Consensus Statement on Inpatient Glycemic Control (2015)  Target Ranges:  Prepandial:   less than 140 mg/dL      Peak postprandial:   less than 180 mg/dL (1-2 hours)      Critically ill patients:  140 - 180 mg/dL    Review of Glycemic Control  Diabetes history: DM 2 (Sees Hillary Blackwood at [facility] last visit 04/04/2018) Outpatient Diabetes medications: Metformin 1000 mg BID, Invokana 100 mg Daily  V-Go 40 Insulin pump Humalog insulin (3, 4, 5 clicks for small, medium, large meals, take 6 clicks if glucose is >867 mg/dl) Freestyle Libre 14 day Sensor for glucose checks  Current orders for Inpatient glycemic control: insulin pump order set  Inpatient Diabetes Program Recommendations:    Spoke with Dr. Vianne Bulls and bedside RN taking care of patient. Patient to resume her insulin pump while here in the hospital. Placed order for insulin pump order set and also discussed plan of care with RN in documentation.  Thanks,  Tama Headings RN, MSN, BC-ADM Inpatient Diabetes Coordinator Team Pager [phone] (8a-5p)

## 2018-06-25 NOTE — ED Notes (Signed)
ED TO INPATIENT HANDOFF REPORT  ED Nurse Name and Phone #:  Anda Kraft [phone]  K Name/Age/Gender Karen [name] 73 y.o. female Room/Bed: ED31A/ED31A  Code Status   Code Status: Full Code  Home/SNF/Other Home Patient oriented to: self, place, time and situation Is this baseline? Yes   Triage Complete: Triage complete  Chief Complaint sob ems  Triage Note Arrived by EMS for SOB that started this morning. Worse with exertion. Normally wears 3L O2. Dry cough started today. [place] urgent [facility] reported O2 in high 80s. Noted bilateral leg swelling   Allergies Allergies  Allergen Reactions  . Ace Inhibitors   . Gabapentin Hives  . Lisinopril   . Lyrica [Pregabalin]   . Shrimp [Shellfish Allergy] Swelling    Swelling of the lips    Level of Care/Admitting Diagnosis ED Disposition    ED Disposition Condition [facility] Area: [place] [[id number]]  Level of Care: Telemetry [5]  Covid Evaluation: N/A  Diagnosis: Acute on chronic respiratory failure [facility]) [[id number]]  Admitting Physician: Epifanio Lesches [[id number]]  Attending Physician: Epifanio Lesches [phone]  Estimated length of stay: past midnight tomorrow  Certification:: I certify this patient will need inpatient services for at least 2 midnights  PT Class (Do Not Modify): Inpatient [101]  PT Acc Code (Do Not Modify): Private [1]       B Medical/Surgery History Past Medical History:  Diagnosis Date  . Acute on chronic respiratory failure with hypoxia and hypercapnia ([place]) 01/07/2015  . Anemia   . 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 Asthma   . Cataract   . CHF (congestive heart failure) ([place])   . Chronic kidney disease 08/10/2017  . CKD (chronic kidney disease)   . COPD (chronic obstructive pulmonary disease) ([place])   . Diabetes mellitus without complication ([place])   . Edema, peripheral 04/20/2014  . History of kidney stones   . Hypertension   . Iron deficiency anemia 06/22/2014  .  Leucocytosis 10/19/2015  . Overactive bladder   . Primary osteoarthritis of right knee 09/01/2016  . Renal insufficiency   . Sciatica 01/07/2015   Past Surgical History:  Procedure Laterality Date  . APPENDECTOMY    . CESAREAN SECTION     x3  . CHOLECYSTECTOMY    . COLONOSCOPY WITH PROPOFOL N/A 08/28/2017   Procedure: COLONOSCOPY WITH PROPOFOL;  Surgeon: Lucilla Lame, MD;  Location: [facility] ENDOSCOPY;  Service: Endoscopy;  Laterality: N/A;  . COLONOSCOPY WITH PROPOFOL N/A 08/29/2017   Procedure: COLONOSCOPY WITH PROPOFOL;  Surgeon: Lucilla Lame, MD;  Location: [facility] ENDOSCOPY;  Service: Endoscopy;  Laterality: N/A;  . CYSTOSCOPY W/ URETERAL STENT PLACEMENT Right 09/15/2017   Procedure: CYSTOSCOPY WITH RETROGRADE PYELOGRAM/URETERAL STENT PLACEMENT;  Surgeon: Cleon Gustin, MD;  Location: [facility] ORS;  Service: Urology;  Laterality: Right;  . CYSTOSCOPY/URETEROSCOPY/HOLMIUM LASER/STENT PLACEMENT Right 10/09/2017   Procedure: CYSTOSCOPY/URETEROSCOPY/HOLMIUM LASER/STENT PLACEMENT;  Surgeon: Abbie Sons, MD;  Location: [facility] ORS;  Service: Urology;  Laterality: Right;  right Stent exchange  . EYE SURGERY       A IV Location/Drains/Wounds Patient Lines/Drains/Airways Status   Active Line/Drains/Airways    Name:   Placement date:   Placement time:   Site:   Days:   Peripheral IV 06/25/18 Right Antecubital   06/25/18    1010    Antecubital   less than 1   Ureteral Drain/Stent Right ureter 6 Fr.   10/09/17    0935    Right ureter   259  Incision (Closed) 10/09/17 Vagina Other (Comment)   10/09/17    0920     259          Intake/Output Last 24 hours No intake or output data in the 24 hours ending 06/25/18 1421  Labs/Imaging Results for orders placed or performed during the hospital encounter of 06/25/18 (from the past 48 hour(s))  Comprehensive metabolic panel     Status: Abnormal   Collection Time: 06/25/18 10:03 AM  Result Value Ref Range   Sodium 137 135 - 145 mmol/L   Potassium 4.8  3.5 - 5.1 mmol/L    Comment: HEMOLYSIS AT THIS LEVEL MAY AFFECT RESULT   Chloride 97 (L) 98 - 111 mmol/L   CO2 27 22 - 32 mmol/L   Glucose, Bld 266 (H) 70 - 99 mg/dL   BUN 20 8 - 23 mg/dL   Creatinine, Ser 0.80 0.44 - 1.00 mg/dL   Calcium 8.9 8.9 - 10.3 mg/dL   Total Protein 6.6 6.5 - 8.1 g/dL   Albumin 3.5 3.5 - 5.0 g/dL   AST 22 15 - 41 U/L   ALT 13 0 - 44 U/L   Alkaline Phosphatase 68 38 - 126 U/L   Total Bilirubin 0.4 0.3 - 1.2 mg/dL   GFR calc non Af Amer >60 >60 mL/min   GFR calc Af Amer >60 >60 mL/min   Anion gap 13 5 - 15    Comment: Performed at [facility], [address] [place], [place] [address]  Troponin I - Once     Status: None   Collection Time: 06/25/18 10:03 AM  Result Value Ref Range   Troponin I <0.03 <0.03 ng/mL    Comment: Performed at [facility], [address] [place], [place] [address]  CBC with Differential     Status: Abnormal   Collection Time: 06/25/18 10:03 AM  Result Value Ref Range   WBC 10.1 4.0 - 10.5 K/uL   RBC 3.70 (L) 3.87 - 5.11 MIL/uL   Hemoglobin 10.4 (L) 12.0 - 15.0 g/dL   HCT 34.0 (L) 36.0 - 46.0 %   MCV 91.9 80.0 - 100.0 fL   MCH 28.1 26.0 - 34.0 pg   MCHC 30.6 30.0 - 36.0 g/dL   RDW 16.0 (H) 11.5 - 15.5 %   Platelets 156 150 - 400 K/uL   nRBC 0.0 0.0 - 0.2 %   Neutrophils Relative % 77 %   Neutro Abs 7.8 (H) 1.7 - 7.7 K/uL   Lymphocytes Relative 12 %   Lymphs Abs 1.2 0.7 - 4.0 K/uL   Monocytes Relative 7 %   Monocytes Absolute 0.7 0.1 - 1.0 K/uL   Eosinophils Relative 2 %   Eosinophils Absolute 0.2 0.0 - 0.5 K/uL   Basophils Relative 1 %   Basophils Absolute 0.1 0.0 - 0.1 K/uL   Immature Granulocytes 1 %   Abs Immature Granulocytes 0.09 (H) 0.00 - 0.07 K/uL    Comment: Performed at [facility], [address] [place], [place] [address]  Brain natriuretic peptide     Status: Abnormal   Collection Time: 06/25/18 10:03 AM  Result Value Ref Range   B Natriuretic Peptide 150.0 (H) 0.0 -  100.0 pg/mL    Comment: Performed at [facility], [address] [place], [place] [address]  SARS Coronavirus 2 [facility] order, Performed in [place] hospital lab)     Status: None   Collection Time: 06/25/18 10:03 AM  Result Value Ref Range  SARS Coronavirus 2 NEGATIVE NEGATIVE    Comment: (NOTE) If result is NEGATIVE SARS-CoV-2 target nucleic acids are NOT DETECTED. The SARS-CoV-2 RNA is generally detectable in upper and lower  respiratory specimens during the acute phase of infection. The lowest  concentration of SARS-CoV-2 viral copies this assay can detect is 250  copies / mL. A negative result does not preclude SARS-CoV-2 infection  and should not be used as the sole basis for treatment or other  patient management decisions.  A negative result may occur with  improper specimen collection / handling, submission of specimen other  than nasopharyngeal swab, presence of viral mutation(s) within the  areas targeted by this assay, and inadequate number of viral copies  (<250 copies / mL). A negative result must be combined with clinical  observations, patient history, and epidemiological information. If result is POSITIVE SARS-CoV-2 target nucleic acids are DETECTED. The SARS-CoV-2 RNA is generally detectable in upper and lower  respiratory specimens dur ing the acute phase of infection.  Positive  results are indicative of active infection with SARS-CoV-2.  Clinical  correlation with patient history and other diagnostic information is  necessary to determine patient infection status.  Positive results do  not rule out bacterial infection or co-infection with other viruses. If result is PRESUMPTIVE POSTIVE SARS-CoV-2 nucleic acids MAY BE PRESENT.   A presumptive positive result was obtained on the submitted specimen  and confirmed on repeat testing.  While 2019 novel coronavirus  (SARS-CoV-2) nucleic acids may be present in the submitted sample  additional  confirmatory testing may be necessary for epidemiological  and / or clinical management purposes  to differentiate between  SARS-CoV-2 and other Sarbecovirus currently known to infect humans.  If clinically indicated additional testing with an alternate test  methodology [phone]) is advised. The SARS-CoV-2 RNA is generally  detectable in upper and lower respiratory sp ecimens during the acute  phase of infection. The expected result is Negative. Fact Sheet for Patients:  StrictlyIdeas.no Fact Sheet for Healthcare Providers: BankingDealers.co.za This test is not yet approved or cleared by the [place] FDA and has been authorized for detection and/or diagnosis of SARS-CoV-2 by FDA under an Emergency Use Authorization (EUA).  This EUA will remain in effect (meaning this test can be used) for the duration of the COVID-19 declaration under Section 564(b)(1) of the Act, 21 U.S.C. section 360bbb-3(b)(1), unless the authorization is terminated or revoked sooner. Performed at [facility], [address] [place], [place] [address]   Urinalysis, Complete w Microscopic     Status: Abnormal   Collection Time: 06/25/18 10:36 AM  Result Value Ref Range   Color, Urine YELLOW (A) YELLOW   APPearance HAZY (A) CLEAR   Specific Gravity, Urine 1.024 1.005 - 1.030   pH 5.0 5.0 - 8.0   Glucose, UA >=500 (A) NEGATIVE mg/dL   Hgb urine dipstick NEGATIVE NEGATIVE   Bilirubin Urine NEGATIVE NEGATIVE   Ketones, ur NEGATIVE NEGATIVE mg/dL   Protein, ur NEGATIVE NEGATIVE mg/dL   Nitrite POSITIVE (A) NEGATIVE   Leukocytes,Ua TRACE (A) NEGATIVE   RBC / HPF 0-5 0 - 5 RBC/hpf   WBC, UA 21-50 0 - 5 WBC/hpf   Bacteria, UA MANY (A) NONE SEEN   Squamous Epithelial / LPF 0-5 0 - 5   Mucus PRESENT     Comment: Performed at [facility], [address], [place], [place] [address]   Dg Chest Portable 1 View  Result Date: 06/25/2018 CLINICAL  DATA:  73 year old with acute onset of shortness of breath and nonproductive cough that began this morning, associated with BILATERAL LOWER extremity edema. Hypoxemia with oxygen saturations in UPPER 80s. Current history of oxygen dependent COPD. EXAM: PORTABLE CHEST 1 VIEW COMPARISON:  02/04/2018 and earlier, including CTA chest 06/23/2016. FINDINGS: Suboptimal inspiration accounts for crowded bronchovascular markings, especially in the bases, and accentuates the cardiac silhouette. Taking this into account, cardiac silhouette mildly to moderately enlarged, unchanged. Pulmonary venous hypertension without overt edema. Lungs clear. No confluent or patchy airspace consolidation. No pleural effusions. IMPRESSION: Suboptimal inspiration. Stable cardiomegaly. Pulmonary venous hypertension without overt edema. No acute cardiopulmonary disease. Electronically Signed   By: Evangeline Dakin M.D.   On: 06/25/2018 10:13    Pending Labs Unresulted Labs (From admission, onward)    Start     Ordered   06/26/18 4098  Basic metabolic panel  Tomorrow morning,   STAT     06/25/18 1417   06/26/18 0500  CBC  Tomorrow morning,   STAT     06/25/18 1417   06/25/18 1415  Urine culture  Add-on,   AD     06/25/18 1417   06/25/18 1412  CBC  (heparin)  Once,   STAT    Comments:  Baseline for heparin therapy IF NOT ALREADY DRAWN.  Notify MD if PLT < 100 K.    06/25/18 1417   06/25/18 1412  Creatinine, serum  (heparin)  Once,   STAT    Comments:  Baseline for heparin therapy IF NOT ALREADY DRAWN.    06/25/18 1417   06/25/18 1411  Hemoglobin A1c  Once,   STAT    Comments:  To assess prior glycemic control    06/25/18 1417   06/25/18 1246  Urine Culture  Add-on,   AD     06/25/18 1245          Vitals/Pain Today's Vitals   06/25/18 1000 06/25/18 1127 06/25/18 1218 06/25/18 1342  BP: (!) 152/71 (!) 149/69  (!) 134/57  Pulse: 67 75 81 77  Resp: (!) 27 17  20   Temp:      TempSrc:      SpO2: 97% 99% 99% 97%   Weight:      Height:      PainSc:        Isolation Precautions No active isolations  Medications Medications  aspirin chewable tablet 81 mg (has no administration in time range)  atorvastatin (LIPITOR) tablet 10 mg (has no administration in time range)  hydrochlorothiazide (HYDRODIURIL) tablet 25 mg (has no administration in time range)  losartan (COZAAR) tablet 100 mg (has no administration in time range)  canagliflozin (INVOKANA) tablet 100 mg (has no administration in time range)  insulin aspart (novoLOG) injection 0-9 Units (has no administration in time range)  heparin injection 5,000 Units (has no administration in time range)  acetaminophen (TYLENOL) tablet 650 mg (has no administration in time range)    Or  acetaminophen (TYLENOL) suppository 650 mg (has no administration in time range)  traZODone (DESYREL) tablet 25 mg (has no administration in time range)  docusate sodium (COLACE) capsule 100 mg (has no administration in time range)  bisacodyl (DULCOLAX) EC tablet 5 mg (has no administration in time range)  ondansetron (ZOFRAN) tablet 4 mg (has no administration in time range)    Or  ondansetron (ZOFRAN) injection 4 mg (has no administration in time range)  furosemide (LASIX) injection 40 mg (has no administration in time range)  cefTRIAXone (ROCEPHIN) 1 g  in sodium chloride 0.9 % 100 mL IVPB (has no administration in time range)  ipratropium-albuterol (DUONEB) 0.5-2.5 (3) MG/3ML nebulizer solution 3 mL (has no administration in time range)  furosemide (LASIX) injection 40 mg (40 mg Intravenous Given 06/25/18 1306)  cefTRIAXone (ROCEPHIN) 1 g in sodium chloride 0.9 % 100 mL IVPB (0 g Intravenous Stopped 06/25/18 1341)    Mobility walks with device High fall risk   Focused Assessments    R Recommendations: See Admitting Provider Note  Report given to:   Additional Notes:

## 2018-06-25 NOTE — ED Triage Notes (Addendum)
Patient c/o bilateral leg swelling and redness that started 1 week ago. Patient states she is having a hard time walking on her legs. Patient does report increased SOB today.

## 2018-06-25 NOTE — ED Triage Notes (Signed)
Arrived by EMS for SOB that started this morning. Worse with exertion. Normally wears 3L O2. Dry cough started today. [place] urgent care reported O2 in high 80s. Noted bilateral leg swelling

## 2018-06-25 NOTE — Progress Notes (Signed)
*  PRELIMINARY RESULTS* Echocardiogram 2D Echocardiogram has been performed. Definity was administered without any adverse reactions.  Karen [name] 06/25/2018, 4:19 PM

## 2018-06-25 NOTE — ED Notes (Signed)
Pt assisted to restroom by this RN °

## 2018-06-25 NOTE — H&P (Signed)
[facility] [place] NAME: Karen [name]    MR#:  [id number]  DATE OF BIRTH:  1945-05-12  DATE OF ADMISSION:  06/25/2018  PRIMARY CARE PHYSICIAN: Ricardo Jericho, NP   REQUESTING/REFERRING PHYSICIAN: Dr. Joni Fears  CHIEF COMPLAINT: Shortness of breath   Chief Complaint  Patient presents with  . Shortness of Breath    HISTORY OF PRESENT ILLNESS:  Karen [name]  is a 73 y.o. female with a known history of COPD, chronic respiratory failure on 3 L all the time comes in because of shortness of breath.  Patient is sent to ED from urgent care because of shortness of breath worsening over 1 week associated with years of chest heaviness in the middle of the chest not radiating intact.  Did not have any fever but complains of lower extremity edema.  Patient O2 saturation 88% on 3 L on oxygen bumped up to 4 L and sats 94%.,  Patient COVID-19 test has been negative, admitting the patient for CHF exacerbation, COPD exacerbation.  PAST MEDICAL HISTORY:   Past Medical History:  Diagnosis Date  . Acute on chronic respiratory failure with hypoxia and hypercapnia ([place]) 01/07/2015  . Anemia   . 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 Asthma   . Cataract   . CHF (congestive heart failure) ([place])   . Chronic kidney disease 08/10/2017  . CKD (chronic kidney disease)   . COPD (chronic obstructive pulmonary disease) ([place])   . Diabetes mellitus without complication ([place])   . Edema, peripheral 04/20/2014  . History of kidney stones   . Hypertension   . Iron deficiency anemia 06/22/2014  . Leucocytosis 10/19/2015  . Overactive bladder   . Primary osteoarthritis of right knee 09/01/2016  . Renal insufficiency   . Sciatica 01/07/2015    PAST SURGICAL HISTOIRY:   Past Surgical History:  Procedure Laterality Date  . APPENDECTOMY    . CESAREAN SECTION     x3  . CHOLECYSTECTOMY    . COLONOSCOPY WITH PROPOFOL N/A 08/28/2017   Procedure: COLONOSCOPY WITH  PROPOFOL;  Surgeon: Lucilla Lame, MD;  Location[facility]COPY;  Service: Endoscopy;  Laterality: N/A;  . COLONOSCOPY WITH PROPOFOL N/A 08/29/2017   Procedure: COLONOSCOPY WITH PROPOFOL;  Surgeon: Lucilla Lame, MD;  Location: [facility]OSCOPY;  Service: Endoscopy;  Laterality: N/A;  . CYSTOSCOPY W/ URETERAL STENT PLACEMENT Right 09/15/2017   Procedure: CYSTOSCOPY WITH RETROGRADE PYELOGRAM/URETERAL STENT PLACEMENT;  Surgeon: Cleon Gustin, MD;  Location: [facility] ORS;  Service: Urology;  Laterality: Right;  . CYSTOSCOPY/URETEROSCOPY/HOLMIUM LASER/STENT PLACEMENT Right 10/09/2017   Procedure: CYSTOSCOPY/URETEROSCOPY/HOLMIUM LASER/STENT PLACEMENT;  Surgeon: Abbie Sons, MD;  Location: [facility] ORS;  Service: Urology;  Laterality: Right;  right Stent exchange  . EYE SURGERY      SOCIAL HISTORY:   Social History   Tobacco Use  . Smoking status: Former Smoker    Last attempt to quit: 12/04/1992    Years since quitting: 25.5  . Smokeless tobacco: Never Used  Substance Use Topics  . Alcohol use: No    FAMILY HISTORY:   Family History  Problem Relation Age of Onset  . Other Mother        unknown medical history  . Other Father        unknown medical history    DRUG ALLERGIES:   Allergies  Allergen Reactions  . Ace Inhibitors   . Gabapentin Hives  . Lisinopril   . Lyrica [Pregabalin]   . Shrimp [Shellfish Allergy] Swelling  Swelling of the lips    REVIEW OF SYSTEMS:  CONSTITUTIONAL: No fever, fatigue or weakness.  EYES: No blurred or double vision.  EARS, NOSE, AND THROAT: No tinnitus or ear pain.  RESPIRATORY: Worsening shortness of breath CARDIOVASCULAR: No chest pain, orthopnea, edema.  GASTROINTESTINAL: No nausea, vomiting, diarrhea or abdominal pain.  GENITOURINARY: No dysuria, hematuria.  ENDOCRINE: No polyuria, nocturia,  HEMATOLOGY: No anemia, easy bruising or bleeding SKIN: No rash or lesion. MUSCULOSKELETAL: No joint pain or arthritis.  Lower extremity  edema. NEUROLOGIC: No tingling, numbness, weakness.  PSYCHIATRY: No anxiety or depression.   MEDICATIONS AT HOME:   Prior to Admission medications   Medication Sig Start Date End Date Taking? Authorizing Provider  aspirin 81 MG chewable tablet Chew 81 mg by mouth daily. On hold   Yes [provider]  atorvastatin (LIPITOR) 10 MG tablet Take 1 tablet by mouth daily.   Yes [provider]  canagliflozin (INVOKANA) 100 MG TABS tablet Take 1 tablet by mouth daily.   Yes [provider]  estradiol (ESTRACE VAGINAL) 0.1 MG/GM vaginal cream Apply 0.27m (pea-sized amount)  just inside the vaginal introitus with a finger-tip on Monday, Wednesday and Friday nights. 08/31/17  Yes McGowan, SLarene BeachA, PA-C  Ferrous Sulfate (IRON) 325 (65 Fe) MG TABS Take 1 tablet by mouth daily. 09/14/17  Yes [provider]  Fluticasone-Umeclidin-Vilant 100-62.5-25 MCG/INH AEPB Inhale 1 puff into the lungs daily. 12/25/17  Yes [provider]  HUMALOG 100 UNIT/ML injection Inject 76 Units into the skin daily. In VGO device 08/19/17  Yes [provider]  Insulin Disposable Pump (V-GO 40) KIT Inject into the skin as directed. 08/14/17  Yes [provider]  Insulin Human (INSULIN PUMP) SOLN Inject 1 each into the skin 3 times daily with meals, bedtime and 2 AM. Patient taking differently: Inject 1 each into the skin 3 times daily with meals, bedtime and 2 AM. Pt uses Novolog 09/18/17  Yes PDustin Flock MD  metFORMIN (GLUCOPHAGE) 500 MG tablet Take 1,000 mg by mouth 2 (two) times daily with a meal.    Yes [provider]  montelukast (SINGULAIR) 10 MG tablet Take 10 mg by mouth at bedtime.   Yes [provider]  OXYGEN Place 3 L/min into the nose.    Yes [provider]  solifenacin (VESICARE) 10 MG tablet Take 1 tablet (10 mg total) by mouth daily. 11/12/17  Yes Stoioff, SRonda Fairly MD  vitamin B-12 (CYANOCOBALAMIN) 500 MCG tablet Take 500 mcg  by mouth daily.   Yes [provider]  fluticasone furoate-vilanterol (BREO ELLIPTA) 100-25 MCG/INH AEPB Inhale 1 puff into the lungs daily.    [provider]  hydrochlorothiazide (HYDRODIURIL) 25 MG tablet Take 25 mg by mouth daily. On hold    [provider]  losartan (COZAAR) 100 MG tablet Take 100 mg by mouth daily. 09/12/17   [provider]      VITAL SIGNS:  Blood pressure (!) 134/57, pulse 77, temperature 98 F (36.7 C), temperature source Oral, resp. rate 20, height '5\' 3"'  (1.6 m), weight 101.6 kg, SpO2 97 %.  PHYSICAL EXAMINATION:  GENERAL:  73y.o.-year-old patient lying in the bed with no acute distress.  EYES: Pupils equal, round, reactive to light and accommodation. No scleral icterus. Extraocular muscles intact.  HEENT: Head atraumatic, normocephalic. Oropharynx and nasopharynx clear.  NECK:  Supple, no jugular venous distention. No thyroid enlargement, no tenderness.  LUNGS: Normal breath sounds bilaterally, no wheezing, rales,rhonchi  or crepitation. No use of accessory muscles of respiration.  CARDIOVASCULAR: S1, S2 normal. No murmurs, rubs, or gallops.  ABDOMEN: Soft, nontender, nondistended. Bowel sounds present. No organomegaly or mass.  EXTREMITIES: No pedal edema, cyanosis, or clubbing.  NEUROLOGIC: Cranial nerves II through XII are intact. Muscle strength 5/5 in all extremities. Sensation intact. Gait not checked.  PSYCHIATRIC: The patient is alert and oriented x 3.  SKIN: No obvious rash, lesion, or ulcer.   LABORATORY PANEL:   CBC Recent Labs  Lab 06/25/18 1003  WBC 10.1  HGB 10.4*  HCT 34.0*  PLT 156   ------------------------------------------------------------------------------------------------------------------  Chemistries  Recent Labs  Lab 06/25/18 1003  NA 137  K 4.8  CL 97*  CO2 27  GLUCOSE 266*  BUN 20  CREATININE 0.80  CALCIUM 8.9  AST 22  ALT 13  ALKPHOS 68  BILITOT 0.4    ------------------------------------------------------------------------------------------------------------------  Cardiac Enzymes Recent Labs  Lab 06/25/18 1003  TROPONINI <0.03   ------------------------------------------------------------------------------------------------------------------  RADIOLOGY:  Dg Chest Portable 1 View  Result Date: 06/25/2018 CLINICAL DATA:  73 year old with acute onset of shortness of breath and nonproductive cough that began this morning, associated with BILATERAL LOWER extremity edema. Hypoxemia with oxygen saturations in UPPER 80s. Current history of oxygen dependent COPD. EXAM: PORTABLE CHEST 1 VIEW COMPARISON:  02/04/2018 and earlier, including CTA chest 06/23/2016. FINDINGS: Suboptimal inspiration accounts for crowded bronchovascular markings, especially in the bases, and accentuates the cardiac silhouette. Taking this into account, cardiac silhouette mildly to moderately enlarged, unchanged. Pulmonary venous hypertension without overt edema. Lungs clear. No confluent or patchy airspace consolidation. No pleural effusions. IMPRESSION: Suboptimal inspiration. Stable cardiomegaly. Pulmonary venous hypertension without overt edema. No acute cardiopulmonary disease. Electronically Signed   By: Evangeline Dakin M.D.   On: 06/25/2018 10:13    EKG:   Orders placed or performed during the hospital encounter of 06/25/18  . ED EKG  . ED EKG  . EKG 12-Lead  . EKG 12-Lead  Normal sinus rhythm at 62 bpm.  IMPRESSION AND PLAN:   73 year old female with worsening shortness of breath for 1 week associated with heaviness in the chest,, lower extremity edema,   1.  Acute on chronic respiratory failure secondary to CHF exacerbation.  Admit to telemetry, check daily weights, check echocardiogram, continue IV diuretics.  Continue oxygen and wean off to 3 L as tolerated without patient is already on 3 L of oxygen at home all the time. 2.  Acute CHF, unknown systolic  or diastolic, patient previous echocardiogram showed EF 50% but that was done 3 years ago looked at PCP records.  Patient to return of echocardiogram, continue diuretics, check daily weights, I&O's. 3 .UTI, start Rocephin, follow urine cultures.. #4 diabetes mellitus type 2, follows with Dr. Gabriel Carina, patient is on Invokana, metformin, insulin lispro. #5 chronic respiratory failure, COPD, continue Trelegy Ellipta, Flonase Singulair, oxygen  #6 hyperlipidemia: Continue statins 7.  Obesity. All the records are reviewed and case discussed with ED provider. Management plans discussed with the patient, family and they are in agreement.  CODE STATUS: Full  TOTAL TIME TAKING CARE OF THIS PATIENT: 55 minutes.    Epifanio Lesches M.D on 06/25/2018 at 2:27 PM  Between 7am to 6pm - Pager - [phone]  After 6pm go to [email or web address] - password [email or web address] [facility]  [place] [facility]  Office  [phone]  CC: Primary care physician; White, Orlene Och, NP  Note: This dictation was prepared with Dragon dictation along with smaller phrase technology. Any transcriptional  errors that result from this process are unintentional.

## 2018-06-25 NOTE — ED Provider Notes (Addendum)
[facility]    CSN: [id number] Arrival date & time: 06/25/18  0233  History   Chief Complaint Chief Complaint  Patient presents with  . Leg Swelling   HPI  73 year old female with an extensive past medical history including morbid obesity, COPD, CHF presents with increasing lower extremity edema and shortness of breath.  Patient reports that she has been experiencing increased lower extremity edema and associated redness the past week.  She states that she has been more short of breath today.  Patient's oxygen saturation 88 to 89% on her home 3 L.  Patient states that she normally has an oxygen saturation of 94%.  Patient reports associated difficulty ambulating.  No reports of fever.  No reports of cough.  No known inciting factor.  No other associated symptoms.  No other complaints.  PMH, Surgical Hx, Family Hx, Social History reviewed and updated as below.  Past Medical History:  Diagnosis Date  . Acute on chronic respiratory failure with hypoxia and hypercapnia ([place]) 01/07/2015  . Anemia   . 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 Asthma   . Cataract   . CHF (congestive heart failure) ([place])   . Chronic kidney disease 08/10/2017  . CKD (chronic kidney disease)   . COPD (chronic obstructive pulmonary disease) ([place])   . Diabetes mellitus without complication ([place])   . Edema, peripheral 04/20/2014  . History of kidney stones   . Hypertension   . Iron deficiency anemia 06/22/2014  . Leucocytosis 10/19/2015  . Overactive bladder   . Primary osteoarthritis of right knee 09/01/2016  . Renal insufficiency   . Sciatica 01/07/2015    Patient Active Problem List   Diagnosis Date Noted  . 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 diabetes mellitus ([place]) 01/25/2018  . History of non anemic vitamin B12 deficiency 01/25/2018  . Personal history of kidney stones 11/12/2017  . Urge incontinence 11/12/2017  . Right ureteral stone 09/15/2017  . Acute GI bleeding   . GI bleed  08/26/2017  . Arthritis 08/10/2017  . Chronic kidney disease 08/10/2017  . COPD (chronic obstructive pulmonary disease) ([place]) 08/10/2017  . Diabetes mellitus type 2, uncomplicated ([place]) 43/56/8616  . Hypertension 08/10/2017  . Obesity (BMI 35.0-39.9 without comorbidity) 04/11/2017  . Primary osteoarthritis of right knee 09/01/2016  . Leucocytosis 10/19/2015  . 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 Acute on chronic respiratory failure with hypoxia and hypercapnia ([place]) 01/07/2015  . Sciatica 01/07/2015  . Weakness 01/07/2015  . 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 Iron deficiency anemia 06/22/2014  . Microalbuminuria 06/22/2014  . CHF (congestive heart failure) ([place]) 04/20/2014  . Edema, peripheral 04/20/2014    Past Surgical History:  Procedure Laterality Date  . APPENDECTOMY    . CESAREAN SECTION     x3  . CHOLECYSTECTOMY    . COLONOSCOPY WITH PROPOFOL N/A 08/28/2017   Procedure: COLONOSCOPY WITH PROPOFOL;  Surgeon: Lucilla Lame, MD;  Location: [facility] ENDOSCOPY;  Service: Endoscopy;  Laterality: N/A;  . COLONOSCOPY WITH PROPOFOL N/A 08/29/2017   Procedure: COLONOSCOPY WITH PROPOFOL;  Surgeon: Lucilla Lame, MD;  Location: [facility] ENDOSCOPY;  Service: Endoscopy;  Laterality: N/A;  . CYSTOSCOPY W/ URETERAL STENT PLACEMENT Right 09/15/2017   Procedure: CYSTOSCOPY WITH RETROGRADE PYELOGRAM/URETERAL STENT PLACEMENT;  Surgeon: Cleon Gustin, MD;  Location: [facility] ORS;  Service: Urology;  Laterality: Right;  . CYSTOSCOPY/URETEROSCOPY/HOLMIUM LASER/STENT PLACEMENT Right 10/09/2017   Procedure: CYSTOSCOPY/URETEROSCOPY/HOLMIUM LASER/STENT PLACEMENT;  Surgeon: Abbie Sons, MD;  Location: [facility] ORS;  Service: Urology;  Laterality: Right;  right Stent exchange  . EYE SURGERY      OB History   No obstetric history on file.      Home Medications    Prior to Admission medications   Medication Sig Start Date End Date Taking? Authorizing Provider  aspirin 81 MG chewable  tablet Chew 81 mg by mouth daily. On hold    [provider]  atorvastatin (LIPITOR) 10 MG tablet Take 1 tablet by mouth daily.    [provider]  azithromycin (ZITHROMAX) 250 MG tablet 2 tablets on day 1, then 1 tablet daily on days 2-5. 05/19/18   Coral Spikes, DO  benzonatate (TESSALON) 200 MG capsule Take 1 capsule (200 mg total) by mouth 3 (three) times daily as needed. 05/19/18   Coral Spikes, DO  canagliflozin (INVOKANA) 100 MG TABS tablet Take 1 tablet by mouth daily.    [provider]  estradiol (ESTRACE VAGINAL) 0.1 MG/GM vaginal cream Apply 0.26m (pea-sized amount)  just inside the vaginal introitus with a finger-tip on Monday, Wednesday and Friday nights. 08/31/17   McGowan, SHunt Oris PA-C  Ferrous Sulfate (IRON) 325 (65 Fe) MG TABS Take 1 tablet by mouth daily. 09/14/17   [provider]  fluticasone furoate-vilanterol (BREO ELLIPTA) 100-25 MCG/INH AEPB Inhale 1 puff into the lungs daily.    [provider]  HUMALOG 100 UNIT/ML injection Inject 76 Units into the skin daily. In VGO device 08/19/17   [provider]  hydrochlorothiazide (HYDRODIURIL) 25 MG tablet Take 25 mg by mouth daily. On hold    [provider]  Insulin Disposable Pump (V-GO 40) KIT Inject into the skin as directed. 08/14/17   [provider]  Insulin Human (INSULIN PUMP) SOLN Inject 1 each into the skin 3 times daily with meals, bedtime and 2 AM. Patient taking differently: Inject 1 each into the skin 3 times daily with meals, bedtime and 2 AM. Pt uses Novolog 09/18/17   PDustin Flock MD  losartan (COZAAR) 100 MG tablet Take 100 mg by mouth daily. 09/12/17   [provider]  metFORMIN (GLUCOPHAGE) 500 MG tablet Take 1,000 mg by mouth 2 (two) times daily with a meal.     [provider]  montelukast (SINGULAIR) 10 MG tablet Take 10 mg by mouth at bedtime.    [provider]  OXYGEN Place 3 L/min into the nose.      [provider]  predniSONE (DELTASONE) 50 MG tablet 1 tablet daily x 5 days 05/19/18   CCoral Spikes DO  solifenacin (VESICARE) 10 MG tablet Take 1 tablet (10 mg total) by mouth daily. 11/12/17   Stoioff, SRonda Fairly MD  vitamin B-12 (CYANOCOBALAMIN) 500 MCG tablet Take 500 mcg by mouth daily.    [provider]    Family History Family History  Problem Relation Age of Onset  . Other Mother        unknown medical history  . Other Father        unknown medical history    Social History Social History   Tobacco Use  . Smoking status: Former Smoker    Last attempt to quit: 12/04/1992    Years since quitting: 25.5  . Smokeless tobacco: Never Used  Substance Use Topics  . Alcohol use: No  . Drug use: No     Allergies   Ace inhibitors; Gabapentin; Lisinopril; Lyrica [pregabalin]; and Shrimp [shellfish allergy]   Review of Systems Review of Systems  Constitutional: Negative for fever.  Respiratory: Positive for shortness of breath.   Cardiovascular: Positive for leg swelling.   Physical Exam Triage Vital Signs ED Triage Vitals  Enc Vitals Group     BP 06/25/18 0851 134/78     Pulse Rate 06/25/18 0851 86     Resp 06/25/18 0851 (!) 22     Temp 06/25/18 0851 97.6 F (36.4 C)     Temp Source 06/25/18 0851 Oral     SpO2 06/25/18 0847 (!) 88 %     Weight 06/25/18 0847 224 lb (101.6 kg)     Height 06/25/18 0847 _0  (1.6 m)     Head Circumference --      Peak Flow --      Pain Score 06/25/18 0845 8     Pain Loc --      Pain Edu? --      Excl. in [place]? --    Updated Vital Signs BP 134/78 (BP Location: Left Arm)   Pulse 86   Temp 97.6 F (36.4 C) (Oral)   Resp (!) 22   Ht _1  (1.6 m)   Wt 101.6 kg   SpO2 90%   BMI 39.68 kg/m   Visual Acuity Right Eye Distance:   Left Eye Distance:   Bilateral Distance:    Right Eye Near:   Left Eye Near:    Bilateral Near:     Physical Exam Vitals signs and nursing note reviewed.  Constitutional:       Comments: Morbidly obese female.  Tachypnea noted.  HENT:     Head: Normocephalic and atraumatic.  Eyes:     General:        Right eye: No discharge.        Left eye: No discharge.     Conjunctiva/sclera: Conjunctivae normal.  Cardiovascular:     Rate and Rhythm: Normal rate and regular rhythm.     Heart sounds: Murmur present.  Pulmonary:     Comments: Increased work of breathing noted.  Basilar crackles noted.  Wheezing noted. Neurological:     Mental Status: She is alert.  Psychiatric:        Mood and Affect: Mood normal.        Behavior: Behavior normal.    UC Treatments / Results  Labs (all labs ordered are listed, but only abnormal results are displayed) Labs Reviewed - No data to display  EKG None  Radiology No results found.  Procedures Procedures (including critical care time)  Medications Ordered in UC Medications - No data to display  Initial Impression / Assessment and Plan / UC Course  I have reviewed the triage vital signs and the nursing notes.  Pertinent labs & imaging results that were available during my care of the patient were reviewed by me and considered in my medical decision making (see chart for details).    73 year old female presents with increasing lower extremity edema and shortness of breath.  Patient has multiple comorbidities including COPD and CHF.  Suspect acute CHF.  Sending directly to the hospital for further evaluation and management.  Final Clinical Impressions(s) / UC Diagnoses   Final diagnoses:  SOB (shortness of breath)  Lower extremity edema   Discharge Instructions   None    ED Prescriptions    None     Controlled Substance Prescriptions [place] Controlled Substance Registry consulted? Not Applicable   Coral Spikes, DO 06/25/18 0900    Coral Spikes, DO 06/25/18 [phone]

## 2018-06-25 NOTE — ED Notes (Signed)
Vet, EDT assisted pt to bathroom.

## 2018-06-26 DIAGNOSIS — J432 Centrilobular emphysema: Secondary | ICD-10-CM

## 2018-06-26 DIAGNOSIS — Z794 Long term (current) use of insulin: Secondary | ICD-10-CM

## 2018-06-26 DIAGNOSIS — N183 Chronic kidney disease, stage 3 (moderate): Secondary | ICD-10-CM

## 2018-06-26 DIAGNOSIS — E119 Type 2 diabetes mellitus without complications: Secondary | ICD-10-CM

## 2018-06-26 DIAGNOSIS — I509 Heart failure, unspecified: Secondary | ICD-10-CM

## 2018-06-26 LAB — BASIC METABOLIC PANEL
Anion gap: 8 (ref 5–15)
BUN: 17 mg/dL (ref 8–23)
CO2: 34 mmol/L — ABNORMAL HIGH (ref 22–32)
Calcium: 9 mg/dL (ref 8.9–10.3)
Chloride: 100 mmol/L (ref 98–111)
Creatinine, Ser: 0.79 mg/dL (ref 0.44–1.00)
GFR calc Af Amer: >60 mL/min (ref >60)
GFR calc non Af Amer: >60 mL/min (ref >60)
Glucose, Bld: 134 mg/dL — ABNORMAL HIGH (ref 70–99)
Potassium: 4 mmol/L (ref 3.5–5.1)
Sodium: 142 mmol/L (ref 135–145)

## 2018-06-26 LAB — HEMOGLOBIN A1C
Hgb A1c MFr Bld: 7.7 % — ABNORMAL HIGH (ref 4.8–5.6)
Mean Plasma Glucose: 174 mg/dL

## 2018-06-26 LAB — CBC
HCT: 37.8 % (ref 36.0–46.0)
Hemoglobin: 11.5 g/dL — ABNORMAL LOW (ref 12.0–15.0)
MCH: 27.6 pg (ref 26.0–34.0)
MCHC: 30.4 g/dL (ref 30.0–36.0)
MCV: 90.9 fL (ref 80.0–100.0)
Platelets: 260 10*3/uL (ref 150–400)
RBC: 4.16 MIL/uL (ref 3.87–5.11)
RDW: 16.2 % — ABNORMAL HIGH (ref 11.5–15.5)
WBC: 11 10*3/uL — ABNORMAL HIGH (ref 4.0–10.5)
nRBC: 0 % (ref 0.0–0.2)

## 2018-06-26 LAB — TROPONIN I: Troponin I: <0.03 ng/mL (ref <0.03)

## 2018-06-26 LAB — ECHOCARDIOGRAM COMPLETE
Height: 63 in
Weight: 3584 oz

## 2018-06-26 LAB — GLUCOSE, CAPILLARY
Glucose-Capillary: 113 mg/dL — ABNORMAL HIGH (ref 70–99)
Glucose-Capillary: 131 mg/dL — ABNORMAL HIGH (ref 70–99)
Glucose-Capillary: 199 mg/dL — ABNORMAL HIGH (ref 70–99)
Glucose-Capillary: 149 mg/dL — ABNORMAL HIGH (ref 70–99)
Glucose-Capillary: 243 mg/dL — ABNORMAL HIGH (ref 70–99)

## 2018-06-26 LAB — MAGNESIUM: Magnesium: 2 mg/dL (ref 1.7–2.4)

## 2018-06-26 MED ORDER — INSULIN ASPART 100 UNIT/ML ~~LOC~~ SOLN
1000.0000 [IU] | Freq: Once | SUBCUTANEOUS | Status: DC
  Filled 2018-06-26: qty 10

## 2018-06-26 MED ORDER — SODIUM CHLORIDE 0.9 % IV SOLN
INTRAVENOUS | Status: DC | PRN
  Administered 2018-06-26: 100 mL via INTRAVENOUS

## 2018-06-26 MED ORDER — FUROSEMIDE 10 MG/ML IJ SOLN
60.0000 mg | Freq: Two times a day (BID) | INTRAMUSCULAR | Status: DC
  Administered 2018-06-26 – 2018-06-28 (×4): 60 mg via INTRAVENOUS
  Filled 2018-06-26 (×4): qty 6

## 2018-06-26 NOTE — Evaluation (Signed)
Physical Therapy Evaluation Patient Details Name: Karen [name] MRN: [id number] DOB: 1945/10/16 Today's Date: 06/26/2018   History of Present Illness  73 year old female patient with history of COPD, chronic respiratory failure on oxygen by nasal cannula at 3 L currently admitted for dyspnea.   Clinical Impression  Patient supine in bed with nursing on PT arrival. Patient reports increased fatigue, but amenable to brief evaluation. Patient demonstrates generalized weakness and decreased tolerance to activity as observed and noted by mobility team in the morning: Transfers to chair/Tranfers to [facility] with CGA. 3LO2 at rest SO2 93%. desat to 84% with activity, increased to 4LO2 87%, increased to 5L 92% with activity. Recovered to 96% after activity with increased time, rest breaks, and pursed lip breathing. MMT reveals generalized weakness in BLE as well as limitations in ROM for knee and hip flexion. Patient expressed that she feels she is at her baseline with respect to her mobility, but is limited 2/2 to her SOB. Patient and PT discussed how continued participation in PT can address deficits in strength, mobility, activity tolerance, and energy conservation. Based on patient's current presentation she will benefit from continued skilled therapeutic intervention to address aforementioned deficits and will benefit from HHPT at discharge.   Patient supine in bed with bed alarm on, SCD's applied, and all needs met.     Follow Up Recommendations Home health PT    Equipment Recommendations  Other (comment)(Patient has all DME needed)    Recommendations for Other Services       Precautions / Restrictions Precautions Precautions: Fall      Mobility  Bed Mobility Overal bed mobility: Needs Assistance             General bed mobility comments: Per mobility tech, patient demonstrates need for CGA bed mobility and transfers. Patient too fatigued at this time to participate with  PT.  Transfers Overall transfer level: Needs assistance Equipment used: Rolling walker (2 wheeled) Transfers: Sit to/from Stand Sit to Stand: Min guard         General transfer comment: Per mobility tech, patient demonstrates need for CGA bed mobility and transfers. Patient too fatigued at this time to participate with PT.  Ambulation/Gait             General Gait Details: Patient too fatigued at this time to participate with PT. Patient states she feels she is at her baseline with respect to strength, but struggles 2/2 to dyspnea.  Stairs            Wheelchair Mobility    Modified Rankin (Stroke Patients Only)       Balance Overall balance assessment: History of Falls                                           Pertinent Vitals/Pain Pain Assessment: No/denies pain    Home Living Family/patient expects to be discharged to:: Private residence Living Arrangements: Spouse/significant other Available Help at Discharge: Family Type of Home: Mobile home Home Access: Stairs to enter Entrance Stairs-Rails: Can reach both Entrance Stairs-Number of Steps: 4-5 Home Layout: One level Home Equipment: Grab bars - toilet;Toilet riser;Walker - 2 wheels;Cane - single point      Prior Function Level of Independence: Independent with assistive device(s)         Comments: Patient using RW for >1 year independently.     Hand Dominance  Dominant Hand: Right    Extremity/Trunk Assessment   Upper Extremity Assessment Upper Extremity Assessment: Generalized weakness;Overall [facility] for tasks assessed    Lower Extremity Assessment Lower Extremity Assessment: Generalized weakness;Overall WFL for tasks assessed       Communication   Communication: No difficulties  Cognition Arousal/Alertness: Awake/alert Behavior During Therapy: WFL for tasks assessed/performed Overall Cognitive Status: Within Functional Limits for tasks assessed                                         General Comments      Exercises     Assessment/Plan    PT Assessment Patient needs continued PT services  PT Problem List Decreased strength;Cardiopulmonary status limiting activity;Decreased activity tolerance;Decreased balance;Decreased mobility;Decreased safety awareness;Obesity       PT Treatment Interventions Therapeutic exercise;Gait training;Balance training;Stair training;Neuromuscular re-education;Functional mobility training;Therapeutic activities;Patient/family education    PT Goals (Current goals can be found in the Care Plan section)  Acute Rehab PT Goals Patient Stated Goal: Go home PT Goal Formulation: With patient Time For Goal Achievement: 07/10/18 Potential to Achieve Goals: Good    Frequency Min 2X/week   Barriers to discharge        Co-evaluation               AM-PAC PT "6 Clicks" Mobility  Outcome Measure Help needed turning from your back to your side while in a flat bed without using bedrails?: A Little Help needed moving from lying on your back to sitting on the side of a flat bed without using bedrails?: A Little Help needed moving to and from a bed to a chair (including a wheelchair)?: A Little Help needed standing up from a chair using your arms (e.g., wheelchair or bedside chair)?: A Little Help needed to walk in hospital room?: A Little Help needed climbing 3-5 steps with a railing? : A Little 6 Click Score: 18    End of Session   Activity Tolerance: Patient limited by fatigue Patient left: in bed;with call bell/phone within reach;with bed alarm set;with SCD's reapplied   PT Visit Diagnosis: Unsteadiness on feet (R26.81);Difficulty in walking, not elsewhere classified (R26.2);Muscle weakness (generalized) (M62.81);History of falling (Z91.81)    Time: 5009-3818 PT Time Calculation (min) (ACUTE ONLY): 12 min   Charges:   PT Evaluation $PT Eval Low Complexity: 1 Low         Myles Gip PT, DPT [phone] 06/26/2018, 4:32 PM

## 2018-06-26 NOTE — Progress Notes (Signed)
Per diabetes coordinator each "click" is equal to 2 units. Patient states she is currently taking 3 units with regular meals and 5 units with large meals.

## 2018-06-26 NOTE — Consult Note (Signed)
Cardiology Consultation:   Patient ID: [name] MRN: [id number]; DOB: 07/17/1945  Admit date: 06/25/2018 Date of Consult: 06/26/2018  Primary Care Provider: Ricardo Jericho, NP Primary Cardiologist: New to [facility], Dr. Rockey Situ Primary Electrophysiologist:  None    Patient Profile:   Karen [name] is a 73 y.o. 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 Estanislado Pandy.  History of Present Illness:   Karen [name] is a 73 yo female with PMH of HTN, HLD, DM2, COPD and previously seen by [facility] cardiology in 2017. Unfortunately, patient HPI is limited by the current somnolence of patient and inconsistent history gathered during the consult. Most of HPI obtained via chart review.  Per review of previous [facility] consult documentation, she has a h/o HFpEF (see below echo) with onset unclear but noted LEE and SOB. Recommendation at the time of her last visit was to continue ASA, ARB, statin, and begin a diuretic and BB. She was not started on diuretic therapy or BB at that visit. Of note, on exam today, she denies any history of heart failure or past cardiology visits. She also has a h/o chronic SOB with COPD on 3L home [place] oxygen. She reportedly ambulates around the home with a walker. She denied any past or current h/o smoking; however, per review of EMR, it was documented she was smoking in 2017 with cessation advised. No reported drug or alcohol use. She also reported a poor diet history, consisting of a breakfast with eggs and bacon and dinner often consisting of ribs. She denied adding salt to her food. No current daily physical activity reported and she stated she is unable to walk a block without becoming SOB. She has stable 2 pillow orthopnea but no reported early  satiety.   Per review of ED documentation, she presented to [facility] via urgent care after she was noted to be hypoxic with O2 saturations 88% (94% at home).  She reported 1 week of progressive SOB and increasing LEE. She also had a 20 minute episode of chest pain yesterday morning (4/21) and was described as central tightness that was not positional, non-radiating, and central in location. It was rated 10/10 in severity. In the ED it was described as pleuritic CP with associated LEE and SOB that was made worse on exertion and better when sitting upright. On exam today (4/22), however, she denied any history of pleuritic CP, as well as aggravating or alleviating factors for her SOB (4/22). COVID-10 testing performed and negative. Initial BP elevated at 150/85. RR 24. CXR showed stable cardiomegaly and no acute changes with pulmonary venous HTN without edema. UA positive for UTI. K 4.8. BNP 150. Troponin negative. Hgb 10.4, HCT34. IV lasix was administered in the ED. She was admitted for COPD and CHF exacerbation with cardiology consulted.   Past Medical History:  Diagnosis Date  . Acute on chronic respiratory failure with hypoxia and hypercapnia ([name]) 01/07/2015  . Anemia   . 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 Asthma   . Cataract   . CHF (congestive heart failure) ([place])   . Chronic kidney disease 08/10/2017  . CKD (chronic kidney disease)   . COPD (chronic obstructive pulmonary disease) ([place])   . Diabetes mellitus without complication ([place])   . Edema, peripheral  04/20/2014  . History of kidney stones   . Hypertension   . Iron deficiency anemia 06/22/2014  . Leucocytosis 10/19/2015  . Overactive bladder   . Primary osteoarthritis of right knee 09/01/2016  . Renal insufficiency   . Sciatica 01/07/2015    Past Surgical History:  Procedure Laterality Date  . APPENDECTOMY    . CESAREAN SECTION     x3  . CHOLECYSTECTOMY    . COLONOSCOPY WITH PROPOFOL N/A 08/28/2017   Procedure: COLONOSCOPY WITH PROPOFOL;   Surgeon: Lucilla Lame, MD;  Location: [facility] ENDOSCOPY;  Service: Endoscopy;  Laterality: N/A;  . COLONOSCOPY WITH PROPOFOL N/A 08/29/2017   Procedure: COLONOSCOPY WITH PROPOFOL;  Surgeon: Lucilla Lame, MD;  Location: [facility] ENDOSCOPY;  Service: Endoscopy;  Laterality: N/A;  . CYSTOSCOPY W/ URETERAL STENT PLACEMENT Right 09/15/2017   Procedure: CYSTOSCOPY WITH RETROGRADE PYELOGRAM/URETERAL STENT PLACEMENT;  Surgeon: Cleon Gustin, MD;  Location: [facility] ORS;  Service: Urology;  Laterality: Right;  . CYSTOSCOPY/URETEROSCOPY/HOLMIUM LASER/STENT PLACEMENT Right 10/09/2017   Procedure: CYSTOSCOPY/URETEROSCOPY/HOLMIUM LASER/STENT PLACEMENT;  Surgeon: Abbie Sons, MD;  Location: [facility] ORS;  Service: Urology;  Laterality: Right;  right Stent exchange  . EYE SURGERY       Home Medications:  Prior to Admission medications   Medication Sig Start Date End Date Taking? Authorizing Provider  aspirin 81 MG chewable tablet Chew 81 mg by mouth daily. On hold   Yes [provider]  atorvastatin (LIPITOR) 10 MG tablet Take 1 tablet by mouth daily.   Yes [provider]  canagliflozin (INVOKANA) 100 MG TABS tablet Take 1 tablet by mouth daily.   Yes [provider]  estradiol (ESTRACE VAGINAL) 0.1 MG/GM vaginal cream Apply 0.104m (pea-sized amount)  just inside the vaginal introitus with a finger-tip on Monday, Wednesday and Friday nights. 08/31/17  Yes McGowan, SLarene BeachA, PA-C  Ferrous Sulfate (IRON) 325 (65 Fe) MG TABS Take 1 tablet by mouth daily. 09/14/17  Yes [provider]  Fluticasone-Umeclidin-Vilant 100-62.5-25 MCG/INH AEPB Inhale 1 puff into the lungs daily. 12/25/17  Yes [provider]  HUMALOG 100 UNIT/ML injection Inject 76 Units into the skin daily. In VGO device 08/19/17  Yes [provider]  Insulin Disposable Pump (V-GO 40) KIT Inject into the skin as directed. 08/14/17  Yes [provider]  Insulin Human (INSULIN PUMP) SOLN Inject 1 each  into the skin 3 times daily with meals, bedtime and 2 AM. Patient taking differently: Inject 1 each into the skin 3 times daily with meals, bedtime and 2 AM. Pt uses Novolog 09/18/17  Yes PDustin Flock MD  metFORMIN (GLUCOPHAGE) 500 MG tablet Take 1,000 mg by mouth 2 (two) times daily with a meal.    Yes [provider]  montelukast (SINGULAIR) 10 MG tablet Take 10 mg by mouth at bedtime.   Yes [provider]  OXYGEN Place 3 L/min into the nose.    Yes [provider]  solifenacin (VESICARE) 10 MG tablet Take 1 tablet (10 mg total) by mouth daily. 11/12/17  Yes Stoioff, SRonda Fairly MD  vitamin B-12 (CYANOCOBALAMIN) 500 MCG tablet Take 500 mcg by mouth daily.   Yes [provider]  fluticasone furoate-vilanterol (BREO ELLIPTA) 100-25 MCG/INH AEPB Inhale 1 puff into the lungs daily.    [provider]  hydrochlorothiazide (HYDRODIURIL) 25 MG tablet Take 25 mg by mouth daily. On hold    [provider]  losartan (COZAAR) 100 MG tablet Take 100 mg by mouth daily. 09/12/17  [provider]    Inpatient Medications: Scheduled Meds: . aspirin  81 mg Oral Daily  . atorvastatin  10 mg Oral Daily  . docusate sodium  100 mg Oral BID  . furosemide  40 mg Intravenous Q12H  . heparin  5,000 Units Subcutaneous Q8H  . insulin pump   Subcutaneous TID AC, HS, 0200  . ipratropium-albuterol  3 mL Nebulization Q4H  . losartan  100 mg Oral Daily  . sodium chloride flush  3 mL Intravenous Q12H   Continuous Infusions: . cefTRIAXone (ROCEPHIN)  IV     PRN Meds: acetaminophen **OR** acetaminophen, bisacodyl, ondansetron **OR** ondansetron (ZOFRAN) IV, traZODone  Allergies:    Allergies  Allergen Reactions  . Ace Inhibitors   . Gabapentin Hives  . Lisinopril   . Lyrica [Pregabalin]   . Shrimp [Shellfish Allergy] Swelling    Swelling of the lips    Social History:   Social History   Socioeconomic History  . Marital status: Married     Spouse name: Not on file  . Number of children: Not on file  . Years of education: Not on file  . Highest education level: Not on file  Occupational History  . Not on file  Social Needs  . Financial resource strain: Not on file  . Food insecurity:    Worry: Not on file    Inability: Not on file  . Transportation needs:    Medical: Not on file    Non-medical: Not on file  Tobacco Use  . Smoking status: Former Smoker    Last attempt to quit: 12/04/1992    Years since quitting: 25.5  . Smokeless tobacco: Never Used  Substance and Sexual Activity  . Alcohol use: No  . Drug use: No  . Sexual activity: Not on file  Lifestyle  . Physical activity:    Days per week: Not on file    Minutes per session: Not on file  . Stress: Not on file  Relationships  . Social connections:    Talks on phone: Not on file    Gets together: Not on file    Attends religious service: Not on file    Active member of club or organization: Not on file    Attends meetings of clubs or organizations: Not on file    Relationship status: Not on file  . Intimate partner violence:    Fear of current or ex partner: Not on file    Emotionally abused: Not on file    Physically abused: Not on file    Forced sexual activity: Not on file  Other Topics Concern  . Not on file  Social History Narrative  . Not on file    Family History:   Family History  Problem Relation Age of Onset  . Other Mother        unknown medical history  . Other Father        unknown medical history     ROS:  Please see the history of present illness.  Review of Systems  Constitutional: Positive for malaise/fatigue. Negative for chills, diaphoresis, fever and weight loss.  Respiratory: Positive for shortness of breath.   Cardiovascular: Positive for chest pain, orthopnea and leg swelling. Negative for palpitations.  Gastrointestinal: Positive for abdominal pain. Negative for blood in stool, constipation, diarrhea, heartburn,  melena, nausea and vomiting.  Genitourinary: Negative for dysuria, frequency and urgency.  Psychiatric/Behavioral:       Somnolent, agitated  All other systems reviewed  and are negative.   All other ROS reviewed and negative.     Physical Exam/Data:   Vitals:   06/26/18 0454 06/26/18 0720 06/26/18 0743 06/26/18 1005  BP: (!) 151/67  (!) 154/77   Pulse: 84  80   Resp: 16     Temp: 97.7 F (36.5 C)  97.9 F (36.6 C)   TempSrc: Oral  Oral   SpO2: 92% 93% 96% 96%  Weight: 104.1 kg     Height:        Intake/Output Summary (Last 24 hours) at 06/26/2018 1128 Last data filed at 06/26/2018 0917 Gross per 24 hour  Intake 480 ml  Output 4550 ml  Net -4070 ml   Filed Weights   06/25/18 0948 06/25/18 1843 06/26/18 0454  Weight: 101.6 kg 105.7 kg 104.1 kg   Body mass index is 40.65 kg/m.  General: Morbidly obese female, somnolent on exam, agitated. Sleeping frequently during the exam HEENT: normal Lymph: no adenopathy Neck: JVD difficult to ascertain d/t body habitus Endocrine:  No thryomegaly Vascular: No carotid bruits; FA pulses 2+ bilaterally without bruits  Cardiac:  normal S1, S2; RRR; 2/6 systolic murmur Lungs:  Poor inspiratory effort, distant breath sounds, bilateral wheezing, no rhonchi or rales  Abd: obese, TTP in RUQ Ext: Non-pitting bilateral LEE, bilateral lower extremities elevated and wearing b/l SCDs  Musculoskeletal:  No deformities Skin: warm and dry  Neuro: Somnolent, agitated on exam Psych:  Normal affect   EKG:  The EKG was personally reviewed and demonstrates:  NSR, occasional PVC Telemetry:  Telemetry was personally reviewed and demonstrates:  NSR  Relevant CV Studies:  Pending updated echo  2017 ECHOCARDIOGRAPHIC DESCRIPTIONS AORTIC VALVE   Leaflets:Tricuspid     Morphology:MILDLY THICKENED LEFT VENTRICLE  Closest EF:>55% (Estimated)   Septal:Normal      SAY:TKZS LVH   LEFT ATRIUM     Size:MILDLY ENLARGED   ____________________________________________________________ DOPPLER ECHO and OTHER SPECIAL PROCEDURES   Aortic:No AR             No AS      187.0 cm/sec peak vel     14.0 mmHg peak grad   Mitral:MILD MR            No MS      MV Inflow E Vel=87.3 cm/sec  MV Annulus E'Vel=5.0 cm/sec      E/E'Ratio=17.5 Tricuspid:MILD TR            No TS      243.2 cm/sec peak TR vel   33.7 mmHg peak RV pressure Pulmonary:No PR             No PS      92.9 cm/sec peak vel     3.5 mmHg peak grad ___________________________________________________________________________________________ INTERPRETATION NORMAL LEFT VENTRICULAR SYSTOLIC FUNCTION  WITH MILD LVH MILD VALVULAR REGURGITATION (See above) (MR, TR) NO VALVULAR STENOSIS MILD PHTN EF 50%  Laboratory Data:  Chemistry Recent Labs  Lab 06/25/18 1003 06/26/18 0514  NA 137 142  K 4.8 4.0  CL 97* 100  CO2 27 34*  GLUCOSE 266* 134*  BUN 20 17  CREATININE 0.80 0.79  CALCIUM 8.9 9.0  GFRNONAA >60 >60  GFRAA >60 >60  ANIONGAP 13 8    Recent Labs  Lab 06/25/18 1003  PROT 6.6  ALBUMIN 3.5  AST 22  ALT 13  ALKPHOS 68  BILITOT 0.4   Hematology Recent Labs  Lab 06/25/18 1003 06/26/18 0514  WBC 10.1 11.0*  RBC 3.70* 4.16  HGB 10.4* 11.5*  HCT 34.0* 37.8  MCV 91.9 90.9  MCH 28.1 27.6  MCHC 30.6 30.4  RDW 16.0* 16.2*  PLT 156 260   Cardiac Enzymes Recent Labs  Lab 06/25/18 1003 06/25/18 1832 06/25/18 2245 06/26/18 0514  TROPONINI <0.03 <0.03 <0.03 <0.03   No results for input(s): TROPIPOC in the last 168 hours.  BNP Recent Labs  Lab 06/25/18 1003  BNP 150.0*    DDimer No results for input(s): DDIMER in the last 168 hours.  Radiology/Studies:  Dg Chest Portable 1 View  Result Date: 06/25/2018 CLINICAL DATA:  73 year old with acute onset of shortness of breath and nonproductive cough that began this morning, associated with BILATERAL  LOWER extremity edema. Hypoxemia with oxygen saturations in UPPER 80s. Current history of oxygen dependent COPD. EXAM: PORTABLE CHEST 1 VIEW COMPARISON:  02/04/2018 and earlier, including CTA chest 06/23/2016. FINDINGS: Suboptimal inspiration accounts for crowded bronchovascular markings, especially in the bases, and accentuates the cardiac silhouette. Taking this into account, cardiac silhouette mildly to moderately enlarged, unchanged. Pulmonary venous hypertension without overt edema. Lungs clear. No confluent or patchy airspace consolidation. No pleural effusions. IMPRESSION: Suboptimal inspiration. Stable cardiomegaly. Pulmonary venous hypertension without overt edema. No acute cardiopulmonary disease. Electronically Signed   By: Evangeline Dakin M.D.   On: 06/25/2018 10:13    Assessment and Plan:   Acute on chronic HFpEF - Continued SOB. Non-pitting b/l LEE. Etiology of SOB likely multifactorial given documented h/o HFpEF, pulmonary HTN, COPD on home 3L O2, deconditioning /obesity. BNP 150.  - Per IM, started on lasix 70m q12h. Caution with diuresis d/t known pulmonary HTN and previously documented renal dysfunction. - Daily BMET. Close monitoring of renal function and electrolytes.  - Pending echo.  - Continue to monitor I/O. Recommend daily standing weights. Baseline weight 101kg  105kg  104kg. -4L for admission. - Continue home ARB. Documented allergy to ACEi. Consider addition of BB and lasix as tolerated at discharge given elevated BP and HR this admission. Risk factor modification and diet/exercise changes recommended.  Acute respiratory distress with hypoxia - Etiology likely multifactorial as above: COPD exacerbation verus acute on chronic HFpEF. COVID 19 negative. 3L home oxygen. Continue. Medication management as above.  HTN - Poorly controlled. As above, consider addition of BB and diuretic as tolerated to continue at discharge. Continue losartan  HLD - Continue statin. Consider  recheck of LDL.  DM2 - Poorly controlled on insulin and metformin - A1C 7.7 - Per IM  UTI - Abx. Per IM  Anemia - Per IM. Daily CBC.  Hypokalemia - Resolved. Daily BMET.  COPD - Per IM. Continue [place] oxygen.   For questions or updates, please contact [place]erne M[place]Please consult [email or web address] for contact info under     Signed, JArvil Chaco PA-C  06/26/2018 11:28 AM

## 2018-06-26 NOTE — TOC Initial Note (Signed)
Transition of Care [facility]) - Initial/Assessment Note    Patient Details  Name: Karen [name] MRN: [id number] Date of Birth: 1945-07-05  Transition of Care [facility]) CM/SW Contact:    Elza Rafter, RN Phone Number: 06/26/2018, 3:44 PM  Clinical Narrative:       Patient is from home independent with husband.  Admitted with swelling an SOB.  Hx of CHF, asthma, COPD, DM.  Has a functioning scale at home but does not weigh.  Educated importance of daily weights with her CHF.  Current with PCP; obtains medications at [facility] in [place] without difficulty.  Husband transports to appointments.  Not current with the heart failure clinic.  Chronic oxygen at home @ 3L; portable O2 at bedside.  Offered home health services and patient is currently refusing at this time.              Expected Discharge Plan: Home/Self Care Barriers to Discharge: Continued Medical Work up   Patient Goals and CMS Choice Patient states their goals for this hospitalization and ongoing recovery are:: Feel better and go home      Expected Discharge Plan and Services Expected Discharge Plan: Home/Self Care   Discharge Planning Services: CM Consult   Living arrangements for the past 2 months: Single Family Home Expected Discharge Date: 06/20/18                   HH Arranged: Patient Refused HH    Prior Living Arrangements/Services Living arrangements for the past 2 months: Single Family Home Lives with:: Spouse Patient language and need for interpreter reviewed:: Yes Do you feel safe going back to the place where you live?: Yes          Current home services: DME(oxygen/walker) Criminal Activity/Legal Involvement Pertinent to Current Situation/Hospitalization: No - Comment as needed  Activities of Daily Living Home Assistive Devices/Equipment: Oxygen, Walker (specify type), Cane (specify quad or straight), Insulin Pump, Nebulizer ADL Screening (condition at time of admission) Patient's cognitive ability  adequate to safely complete daily activities?: Yes Is the patient deaf or have difficulty hearing?: No Does the patient have difficulty seeing, even when wearing glasses/contacts?: No Does the patient have difficulty concentrating, remembering, or making decisions?: No Patient able to express need for assistance with ADLs?: Yes Does the patient have difficulty dressing or bathing?: Yes Independently performs ADLs?: Yes (appropriate for developmental age) Does the patient have difficulty walking or climbing stairs?: Yes Weakness of Legs: Both Weakness of Arms/Hands: None  Permission Sought/Granted                  Emotional Assessment Appearance:: Appears stated age Attitude/Demeanor/Rapport: Gracious Affect (typically observed): Accepting Orientation: : Oriented to Self, Oriented to Place, Oriented to  Time, Oriented to Situation Alcohol / Substance Use: Not Applicable    Admission diagnosis:  Acute on chronic respiratory failure with hypoxia (HCC) [J96.21] Type 2 diabetes mellitus without complication, with long-term current use of insulin (HCC) [E11.9, Z79.4] Stage 4 chronic kidney disease ([place]) [N18.4] Acute on chronic congestive heart failure, unspecified heart failure type [facility]) [I50.9] Patient Active Problem List   Diagnosis Date Noted  . Acute on chronic respiratory failure ([place]) 06/25/2018  . 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 diabetes mellitus ([place]) 01/25/2018  . History of non anemic vitamin B12 deficiency 01/25/2018  . Personal history of kidney stones 11/12/2017  . Urge incontinence 11/12/2017  . Right ureteral stone 09/15/2017  . Acute GI bleeding   . GI bleed  08/26/2017  . Arthritis 08/10/2017  . Chronic kidney disease 08/10/2017  . COPD (chronic obstructive pulmonary disease) ([place]) 08/10/2017  . Diabetes mellitus type 2, uncomplicated ([place]) 36/46/8032  . Hypertension 08/10/2017  . Obesity (BMI 35.0-39.9 without comorbidity) 04/11/2017   . Primary osteoarthritis of right knee 09/01/2016  . Leucocytosis 10/19/2015  . 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 Acute on chronic respiratory failure with hypoxia and hypercapnia ([place]) 01/07/2015  . Sciatica 01/07/2015  . Weakness 01/07/2015  . 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 Iron deficiency anemia 06/22/2014  . Microalbuminuria 06/22/2014  . CHF (congestive heart failure) ([place]) 04/20/2014  . Edema, peripheral 04/20/2014   PCP:  Ricardo Jericho, NP Pharmacy:   [facility] [address] [place] [address] Phone: [phone] Fax: [phone]     Social Determinants of Health (SDOH) Interventions    Readmission Risk Interventions Readmission Risk Prevention Plan 06/26/2018  Transportation Screening Complete  PCP or Specialist Appt within 5-7 Days Complete  Home Care Screening Complete  Medication Review (RN CM) Complete  Some recent data might be hidden

## 2018-06-26 NOTE — Plan of Care (Signed)
Nutrition Education Note  RD consulted for nutrition education regarding  CHF.  73 y/o female admitted with COPD and CHF exacerbation  Spoke with pt via telephone   RD provided "Low Sodium Nutrition Therapy" handout from the Academy of [employer] via mail. Reviewed patient's dietary recall. Provided examples on ways to decrease sodium intake in diet. Discouraged intake of processed foods and use of salt shaker. Encouraged fresh fruits and vegetables as well as whole grain sources of carbohydrates to maximize fiber intake.   RD discussed why it is important for patient to adhere to diet recommendations, and emphasized the role of fluids, foods to avoid, and importance of weighing self daily. Teach back method used.  Expect poor compliance.  Body mass index is 40.65 kg/m. Pt meets criteria for obesity based on current BMI.  Current diet order is HH/CHO modified, patient is consuming approximately 80-100% of meals at this time. Labs and medications reviewed. No further nutrition interventions warranted at this time. RD contact information provided. If additional nutrition issues arise, please re-consult RD.   Koleen Distance MS, RD, LDN Pager #- [phone] Office#- [phone] After Hours Pager: [phone]

## 2018-06-26 NOTE — Progress Notes (Signed)
[place] [facility] [place] NAME: Messiah Rovira    MR#:  [id number]  DATE OF BIRTH:  [birth year]  SUBJECTIVE:  CHIEF COMPLAINT:   Chief Complaint  Patient presents with  . Shortness of Breath  Patient seen and evaluated today Has shortness of breath Desaturated when patient was transferred from the bed to the chair to 84% Oxygen was increased to 5 L and O2 sats came up to 96% No complaints of chest pain Diuresed well this morning after Lasix was given  REVIEW OF SYSTEMS:    ROS  CONSTITUTIONAL: No documented fever. No fatigue, weakness. No weight gain, no weight loss.  EYES: No blurry or double vision.  ENT: No tinnitus. No postnasal drip. No redness of the oropharynx.  RESPIRATORY: No cough, no wheeze, no hemoptysis. Has dyspnea.  CARDIOVASCULAR: No chest pain. No orthopnea. No palpitations. No syncope.  GASTROINTESTINAL: No nausea, no vomiting or diarrhea. No abdominal pain. No melena or hematochezia.  GENITOURINARY: No dysuria or hematuria.  ENDOCRINE: No polyuria or nocturia. No heat or cold intolerance.  HEMATOLOGY: No anemia. No bruising. No bleeding.  INTEGUMENTARY: No rashes. No lesions.  MUSCULOSKELETAL: No arthritis. No swelling. No gout.  NEUROLOGIC: No numbness, tingling, or ataxia. No seizure-type activity.  PSYCHIATRIC: No anxiety. No insomnia. No ADD.   DRUG ALLERGIES:   Allergies  Allergen Reactions  . Ace Inhibitors   . Gabapentin Hives  . Lisinopril   . Lyrica [Pregabalin]   . Shrimp [Shellfish Allergy] Swelling    Swelling of the lips    VITALS:  Blood pressure (!) 154/77, pulse 80, temperature 97.9 F (36.6 C), temperature source Oral, resp. rate 16, height 5\' 3"  (1.6 m), weight 104.1 kg, SpO2 96 %.  PHYSICAL EXAMINATION:   Physical Exam  GENERAL:  73 y.o.-year-old patient lying in the bed with no acute distress.  EYES: Pupils equal, round, reactive to light and accommodation. No scleral icterus.  Extraocular muscles intact.  HEENT: Head atraumatic, normocephalic. Oropharynx and nasopharynx clear.  NECK:  Supple, no jugular venous distention. No thyroid enlargement, no tenderness.  LUNGS: Decreased breath sounds bilaterally, bibasilar crepitations heard. No use of accessory muscles of respiration.  CARDIOVASCULAR: S1, S2 normal. No murmurs, rubs, or gallops.  ABDOMEN: Soft, nontender, nondistended. Bowel sounds present. No organomegaly or mass.  EXTREMITIES: No cyanosis, clubbing  Has edema b/l.    NEUROLOGIC: Cranial nerves II through XII are intact. No focal Motor or sensory deficits b/l.   PSYCHIATRIC: The patient is alert and oriented x 3.  SKIN: No obvious rash, lesion, or ulcer.   LABORATORY PANEL:   CBC Recent Labs  Lab 06/26/18 0514  WBC 11.0*  HGB 11.5*  HCT 37.8  PLT 260   ------------------------------------------------------------------------------------------------------------------ Chemistries  Recent Labs  Lab 06/25/18 1003 06/26/18 0514  NA 137 142  K 4.8 4.0  CL 97* 100  CO2 27 34*  GLUCOSE 266* 134*  BUN 20 17  CREATININE 0.80 0.79  CALCIUM 8.9 9.0  AST 22  --   ALT 13  --   ALKPHOS 68  --   BILITOT 0.4  --    ------------------------------------------------------------------------------------------------------------------  Cardiac Enzymes Recent Labs  Lab 06/26/18 0514  TROPONINI <0.03   ------------------------------------------------------------------------------------------------------------------  RADIOLOGY:  Dg Chest Portable 1 View  Result Date: 06/25/2018 CLINICAL DATA:  73 year old with acute onset of shortness of breath and nonproductive cough that began this morning, associated with BILATERAL LOWER extremity edema. Hypoxemia with oxygen saturations in UPPER 80s.  Current history of oxygen dependent COPD. EXAM: PORTABLE CHEST 1 VIEW COMPARISON:  02/04/2018 and earlier, including CTA chest 06/23/2016. FINDINGS: Suboptimal  inspiration accounts for crowded bronchovascular markings, especially in the bases, and accentuates the cardiac silhouette. Taking this into account, cardiac silhouette mildly to moderately enlarged, unchanged. Pulmonary venous hypertension without overt edema. Lungs clear. No confluent or patchy airspace consolidation. No pleural effusions. IMPRESSION: Suboptimal inspiration. Stable cardiomegaly. Pulmonary venous hypertension without overt edema. No acute cardiopulmonary disease. Electronically Signed   By: Evangeline Dakin M.D.   On: 06/25/2018 10:13     ASSESSMENT AND PLAN:  73 year old female patient with history of COPD, chronic respiratory failure on oxygen by nasal cannula at 3 L currently under hospitalist service for dyspnea.  COVID-19 test negative.  -Acute respiratory distress with hypoxia Secondary to heart failure exacerbation COVID-19 test negative Oxygen via nasal cannula  -Acute on chronic congestive heart failure exacerbation Diuresis with IV Lasix Monitor input output chart Daily body weights Monitor electrolytes Cardiology consult Follow-up echocardiogram  -Urinary tract infection Continue IV Rocephin antibiotic and follow-up cultures  -Type 2 diabetes mellitus On insulin pump and diabetic diet  -COPD Continue inhaler therapy and oxygen via nasal cannula  -Hyperlipidemia Continue statin medication  All the records are reviewed and case discussed with Care Management/Social Worker. Management plans discussed with the patient, family and they are in agreement.  CODE STATUS: Full code  DVT Prophylaxis: SCDs  TOTAL TIME TAKING CARE OF THIS PATIENT: 36 minutes.   POSSIBLE D/C IN 2 to 3 DAYS, DEPENDING ON CLINICAL CONDITION.  Saundra Shelling M.D on 06/26/2018 at 10:43 AM  Between 7am to 6pm - Pager - [phone]  After 6pm go to [email or web address] - password [email or web address] [place] [facility]  Office  [phone]  CC: Primary care physician; White,  Orlene Och, NP  Note: This dictation was prepared with Dragon dictation along with smaller phrase technology. Any transcriptional errors that result from this process are unintentional.

## 2018-06-26 NOTE — Progress Notes (Signed)
Advanced care plan. Purpose of the Encounter: CODE STATUS Parties in Attendance: Patient Patient's Decision Capacity: Good Subjective/Patient's story: 73 year old female patient with history of COPD, chronic respiratory failure on oxygen by nasal cannula at 3 L currently under hospitalist service for dyspnea.  Objective/Medical story Patient has acute on chronic heart failure exacerbation.  Needs IV Lasix for diuresis.  COVID-19 test is negative.  Needs cardiology evaluation and echocardiogram. Goals of care determination:  Advance care directives goals of care and treatment plan discussed.  Patient wants everything done which includes CPR, intubation ventilator if the need arises. CODE STATUS: Full code Time spent discussing advanced care planning: 16 minutes

## 2018-06-26 NOTE — Progress Notes (Signed)
Inpatient Diabetes Program Recommendations  AACE/ADA: New Consensus Statement on Inpatient Glycemic Control (2015)  Target Ranges:  Prepandial:   less than 140 mg/dL      Peak postprandial:   less than 180 mg/dL (1-2 hours)      Critically ill patients:  140 - 180 mg/dL   Lab Results  Component Value Date   GLUCAP 131 (H) 06/26/2018   HGBA1C 7.7 (H) 06/25/2018    Review of Glycemic Control Results for Karen [name], Karen [name] (MRN [id number]) as of 06/26/2018 11:04  Ref. Range 06/25/2018 17:02 06/25/2018 21:32 06/26/2018 01:38 06/26/2018 07:43  Glucose-Capillary Latest Ref Range: 70 - 99 mg/dL 81 98 113 (H) 131 (H)  Diabetes history: DM 2 (Sees Hillary Blackwood at [place] [facility] last visit 04/04/2018) Outpatient Diabetes medications: Metformin 1000 mg BID, Invokana 100 mg Daily  V-Go 40 Insulin pump Humalog insulin (3, 4, 5 clicks for small, medium, large meals, take 6 clicks if glucose is >163 mg/dl) Freestyle Libre 14 day Sensor for glucose checks Inpatient DM orders:  Insulin pump- (V-GO insulin pump)- Per RN, Patient taking 3 clicks for meals (this equals 6 units) Inpatient Diabetes Program Recommendations:   Patient has supplies at bedside for V-Go-40. Blood sugars currently well controlled.  No recommendations at this time.   Thanks,  Adah Perl, RN, BC-ADM Inpatient Diabetes Coordinator Pager [phone] (8a-5p)

## 2018-06-27 DIAGNOSIS — J9621 Acute and chronic respiratory failure with hypoxia: Secondary | ICD-10-CM

## 2018-06-27 LAB — GLUCOSE, CAPILLARY
Glucose-Capillary: 180 mg/dL — ABNORMAL HIGH (ref 70–99)
Glucose-Capillary: 210 mg/dL — ABNORMAL HIGH (ref 70–99)
Glucose-Capillary: 226 mg/dL — ABNORMAL HIGH (ref 70–99)
Glucose-Capillary: 201 mg/dL — ABNORMAL HIGH (ref 70–99)
Glucose-Capillary: 233 mg/dL — ABNORMAL HIGH (ref 70–99)

## 2018-06-27 LAB — URINE CULTURE: Culture: >=100000 — AB

## 2018-06-27 LAB — BASIC METABOLIC PANEL
Anion gap: 10 (ref 5–15)
BUN: 18 mg/dL (ref 8–23)
CO2: 38 mmol/L — ABNORMAL HIGH (ref 22–32)
Calcium: 8.8 mg/dL — ABNORMAL LOW (ref 8.9–10.3)
Chloride: 90 mmol/L — ABNORMAL LOW (ref 98–111)
Creatinine, Ser: 0.73 mg/dL (ref 0.44–1.00)
GFR calc Af Amer: >60 mL/min (ref >60)
GFR calc non Af Amer: >60 mL/min (ref >60)
Glucose, Bld: 214 mg/dL — ABNORMAL HIGH (ref 70–99)
Potassium: 4.2 mmol/L (ref 3.5–5.1)
Sodium: 138 mmol/L (ref 135–145)

## 2018-06-27 LAB — CBC
HCT: 40.5 % (ref 36.0–46.0)
Hemoglobin: 11.8 g/dL — ABNORMAL LOW (ref 12.0–15.0)
MCH: 27.1 pg (ref 26.0–34.0)
MCHC: 29.1 g/dL — ABNORMAL LOW (ref 30.0–36.0)
MCV: 92.9 fL (ref 80.0–100.0)
Platelets: 269 10*3/uL (ref 150–400)
RBC: 4.36 MIL/uL (ref 3.87–5.11)
RDW: 15.9 % — ABNORMAL HIGH (ref 11.5–15.5)
WBC: 10.7 10*3/uL — ABNORMAL HIGH (ref 4.0–10.5)
nRBC: 0 % (ref 0.0–0.2)

## 2018-06-27 MED ORDER — IPRATROPIUM-ALBUTEROL 0.5-2.5 (3) MG/3ML IN SOLN
3.0000 mL | Freq: Three times a day (TID) | RESPIRATORY_TRACT | Status: DC
  Administered 2018-06-27 – 2018-06-30 (×9): 3 mL via RESPIRATORY_TRACT
  Filled 2018-06-27 (×9): qty 3

## 2018-06-27 MED ORDER — ALBUTEROL SULFATE (2.5 MG/3ML) 0.083% IN NEBU: 2.5000 mg | INHALATION_SOLUTION | RESPIRATORY_TRACT | Status: DC | PRN

## 2018-06-27 MED ORDER — CEPHALEXIN 500 MG PO CAPS
500.0000 mg | ORAL_CAPSULE | Freq: Three times a day (TID) | ORAL | Status: AC
  Administered 2018-06-27 – 2018-06-29 (×8): 500 mg via ORAL
  Filled 2018-06-27 (×8): qty 1

## 2018-06-27 MED ORDER — BISOPROLOL FUMARATE 5 MG PO TABS
5.0000 mg | ORAL_TABLET | Freq: Every day | ORAL | Status: DC
  Administered 2018-06-27 – 2018-06-30 (×4): 5 mg via ORAL
  Filled 2018-06-27 (×4): qty 1

## 2018-06-27 MED ORDER — ENOXAPARIN SODIUM 40 MG/0.4ML ~~LOC~~ SOLN
40.0000 mg | Freq: Two times a day (BID) | SUBCUTANEOUS | Status: DC
  Administered 2018-06-27 – 2018-06-29 (×5): 40 mg via SUBCUTANEOUS
  Filled 2018-06-27 (×7): qty 0.4

## 2018-06-27 NOTE — Progress Notes (Signed)
Cardiovascular and Pulmonary Nurse Navigator Note:    73 year old female patient with history of CHF, anemia, asthma, CKD, DM, COPD, HTN,  chronic respiratory failure on oxygen by [place] at 3 liters for dyspnea who presented to the ED with c/o SOB, chest heaviness in the middle of her chest, and lower extremity edema. (Note:   COVID-19 test negative.)  Patient admitted for CHF and COPD exacerbation.    CHF Education:?? Patient has  "Living Better with Heart Failure" packet at bedside.  Briefly reviewed definition of heart failure and signs and symptoms of an exacerbation.?Explained to patient that HF is a chronic illness which requires self-assessment / self-management along with help from the cardiologist/PCP.?? ? *Reviewed importance of and reason behind checking weight daily in the AM, after using the bathroom, but before getting dressed. Patient has functioning scale, but does not weigh.  Patient stated, "I know it is recommended, but I just don't weigh."  ? *Reviewed with patient the following information: *Discussed when to call the Dr= weight gain of >2-3lb overnight of 5lb in a week,  *Discussed yellow zone= call MD: weight gain of >2-3lb overnight of 5lb in a week, increased swelling, increased SOB when lying down, chest discomfort, dizziness, increased fatigue *Red Zone= call 911: struggle to breath, fainting or near fainting, significant chest pain   HF Zone Magnet reviewed with patient.    *Diet - Patient is on a heart healthy / carb modified diet.  Dietitian Consultation for diet education completed yesterday.   ? *Discussed fluid intake with patient as well. Patient not currently on a fluid restriction, but advised no more than 64 ounces of fluid per day.? ? *Instructed patient to take medications as prescribed for heart failure. Explained briefly why pt is on the medications (either make you feel better, live longer or keep you out of the hospital) and discussed monitoring and side  effects.  ? *Discussed exercise.  As mentioned above, patient is on home oxygen and walker.  Per RN CM note, patient has declined any in-home services.  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 Patient stated no one has ever mentioned this to her before.  Note:  Patient does not drive.  Patient's husband drives patient to her appts.    Will follow-up with patient tomorrow.   ? *Smoking Cessation- Patient is a FORMER smoker.? ? *[facility] - Explained the purpose of the HF Clinic. ?Explained to patient the HF Clinic does not replace PCP nor Cardiologist, but is an additional resource to helping patient manage heart failure at home.  Virtual appointment scheduled for 07/04/2018 at 11:40 a.m.   ? Again, the 5 Steps to Living Better with Heart Failure were reviewed with patient.  ? Patient thanked me for providing the above information. ? ? Roanna Epley, RN, BSN, [facility]? [facility]  Cardiovascular &?Pulmonary Nurse Navigator  Direct Line: [phone]  Department Phone #: [phone] Fax: [phone]? Email Address: [email or web address]  ?

## 2018-06-27 NOTE — Progress Notes (Signed)
OT Cancellation Note  Patient Details Name: Karen [name] MRN: [id number] DOB: 14-Oct-1945   Cancelled Treatment:    Reason Eval/Treat Not Completed: Patient declined, no reason specified. OT consult received, chart reviewed. Upon attempt, pt reporting "I just don't feel well." Pt reports RN is aware of her concerns and politely requests OT come back this afternoon. Will re-attempt OT evaluation this afternoon with emphasis on energy conservation strategies for improved self mgt of COPD and for instruction on tub transfers to improve safety/independence at home.    Jeni Salles, MPH, MS, OTR/L ascom [phone] 06/27/18, 11:22 AM

## 2018-06-27 NOTE — Progress Notes (Signed)
Progress Note  Patient Name: Karen [name] Date of Encounter: 06/27/2018  Primary Cardiologist: Iverson Alamin, Previously [place]   Patient denies chest pain, palpitations, and racing heart rate. She reportedly slept well last night and has more energy today. She continues to report SOB on 4L [place] oxygen. She states this SOB is improved but she is not yet back to baseline (3L [place] on home O2). She also continues to report lower extremity edema that has not improved since admission, as it still remains erythematous and tender to palpation.   Inpatient Medications    Scheduled Meds: . aspirin  81 mg Oral Daily  . atorvastatin  10 mg Oral Daily  . docusate sodium  100 mg Oral BID  . furosemide  60 mg Intravenous Q12H  . heparin  5,000 Units Subcutaneous Q8H  . insulin aspart  1,000 Units Subcutaneous Once  . insulin pump   Subcutaneous TID AC, HS, 0200  . ipratropium-albuterol  3 mL Nebulization Q4H  . losartan  100 mg Oral Daily  . sodium chloride flush  3 mL Intravenous Q12H   Continuous Infusions: . sodium chloride 100 mL (06/26/18 1314)  . cefTRIAXone (ROCEPHIN)  IV 1 g (06/26/18 1315)   PRN Meds: sodium chloride, acetaminophen **OR** acetaminophen, bisacodyl, ondansetron **OR** ondansetron (ZOFRAN) IV, traZODone   Vital Signs    Vitals:   06/26/18 2005 06/26/18 2050 06/27/18 0407 06/27/18 0738  BP: (!) 159/70  119/69 139/60  Pulse: 90  89 88  Resp: 16  18   Temp: (!) 97.5 F (36.4 C)  98.4 F (36.9 C) 98.2 F (36.8 C)  TempSrc: Oral  Oral Oral  SpO2: 96% 96% 92% 92%  Weight:   102.6 kg   Height:        Intake/Output Summary (Last 24 hours) at 06/27/2018 7939 Last data filed at 06/26/2018 2300 Gross per 24 hour  Intake 720 ml  Output 2800 ml  Net -2080 ml   Filed Weights   06/25/18 1843 06/26/18 0454 06/27/18 0407  Weight: 105.7 kg 104.1 kg 102.6 kg    Telemetry    NSR - Personally Reviewed  ECG    No new tracings - Personally  Reviewed  Physical Exam   GEN: Morbidly obese female. Lying in bed Neck: JVD difficult to ascertain d/t body habitus Cardiac: RRR, 1/6 systolic murmur, no rubs, or gallops.  Respiratory: Coarse bilateral breath sounds, scattered rales and diffuse wheezing throughout bilateral lung fields.   GI: Obese, nontender MS: 2+ b/l lower extremity edema; b/l lower extremity is notable for erythema and is very tender to palpation.  Neuro:  Nonfocal   Psych: Normal affect, pleasant   Labs    Chemistry Recent Labs  Lab 06/25/18 1003 06/26/18 0514 06/27/18 0425  NA 137 142 138  K 4.8 4.0 4.2  CL 97* 100 90*  CO2 27 34* 38*  GLUCOSE 266* 134* 214*  BUN 20 17 18   CREATININE 0.80 0.79 0.73  CALCIUM 8.9 9.0 8.8*  PROT 6.6  --   --   ALBUMIN 3.5  --   --   AST 22  --   --   ALT 13  --   --   ALKPHOS 68  --   --   BILITOT 0.4  --   --   GFRNONAA >60 >60 >60  GFRAA >60 >60 >60  ANIONGAP 13 8 10      Hematology Recent Labs  Lab 06/25/18 1003 06/26/18 0514 06/27/18  0425  WBC 10.1 11.0* 10.7*  RBC 3.70* 4.16 4.36  HGB 10.4* 11.5* 11.8*  HCT 34.0* 37.8 40.5  MCV 91.9 90.9 92.9  MCH 28.1 27.6 27.1  MCHC 30.6 30.4 29.1*  RDW 16.0* 16.2* 15.9*  PLT 156 260 269    Cardiac Enzymes Recent Labs  Lab 06/25/18 1003 06/25/18 1832 06/25/18 2245 06/26/18 0514  TROPONINI <0.03 <0.03 <0.03 <0.03   No results for input(s): TROPIPOC in the last 168 hours.   BNP Recent Labs  Lab 06/25/18 1003  BNP 150.0*     DDimer No results for input(s): DDIMER in the last 168 hours.   Radiology    Dg Chest Portable 1 View  Result Date: 06/25/2018 CLINICAL DATA:  73 year old with acute onset of shortness of breath and nonproductive cough that began this morning, associated with BILATERAL LOWER extremity edema. Hypoxemia with oxygen saturations in UPPER 80s. Current history of oxygen dependent COPD. EXAM: PORTABLE CHEST 1 VIEW COMPARISON:  02/04/2018 and earlier, including CTA chest 06/23/2016.  FINDINGS: Suboptimal inspiration accounts for crowded bronchovascular markings, especially in the bases, and accentuates the cardiac silhouette. Taking this into account, cardiac silhouette mildly to moderately enlarged, unchanged. Pulmonary venous hypertension without overt edema. Lungs clear. No confluent or patchy airspace consolidation. No pleural effusions. IMPRESSION: Suboptimal inspiration. Stable cardiomegaly. Pulmonary venous hypertension without overt edema. No acute cardiopulmonary disease. Electronically Signed   By: Evangeline Dakin M.D.   On: 06/25/2018 10:13    Cardiac Studies   TTE 06/26/2018 1. The left ventricle has low normal systolic function, with an ejection fraction of 50-55%. The cavity size was normal. Left ventricular diastolic parameters were normal.  2. The right ventricle has normal systolic function. The cavity was normal. There is no increase in right ventricular wall thickness.  3. Mild thickening of the mitral valve leaflet.  4. The aortic valve is tricuspid. Moderate thickening of the aortic valve. Mild calcification of the aortic valve. Aortic valve regurgitation is trivial by color flow Doppler. Mild stenosis of the aortic valve.  Patient Profile     73 y.o. 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  Assessment & Plan    Acute on chronic HFpEF - Exam consistent with volume overload. SOB improved but not yet at baseline on current 4L [place]. 2+ LEE with erythema and TTP.  - Etiology of SOB likely multifactorial given documented h/o HFpEF, poorly controlled HTN, COPD on home 3L O2, deconditioning /obesity. BNP 150.  - Echo as above continues to show normal EF - Per IM, started on lasix 60mg  q12h. Continue. - Patient reports  significant output at almost 6L for admission. Weights vary 224lbs  229lbs  226lbs. Continue to monitor I/O, daily standing weights.   - Daily BMET with close monitoring of renal function and electrolytes given diuresis. Renal function currently stable 0.79  0.73. K 4.0  4.2.  - Continue home ARB. Documented allergy to ACEi. Consider future addition of BB as breathing and BP/HR tolerates. Will need discharged with home diuretic as renal function tolerates this admission and given elevated BP and HR. Risk factor modification and diet/exercise changes recommended.  Acute respiratory distress with hypoxia - Etiology likely multifactorial as above: COPD exacerbation verus acute on chronic HFpEF. COVID 19 negative. 3L  home oxygen. Continue. Medication management as above.  HTN - Poorly controlled. SBP improved since yesterday with diuresis and currently 139/60 with SBP 119 this AM. As above, addition of BB recommended as breathing and HR/BP tolerates to continue at discharge. Continue losartan. Will need discharged on home diuretic as above.   HLD - Continue statin. Consider recheck of LDL.  DM2 - Poorly controlled on insulin and metformin - A1C 7.7 - Per IM. Nutrition consult recommended.  UTI, Kidney stones - Abx. Per IM  Anemia - Per IM. Daily CBC.  Hypokalemia - Resolved. Daily BMET.  COPD - Per IM. H/o smoking. Continue [place] oxygen.  For questions or updates, please contact [place] Please consult [email or web address] for contact info under        Signed, Arvil Chaco, PA-C  06/27/2018, 7:42 AM

## 2018-06-27 NOTE — Progress Notes (Signed)
PT Cancellation Note  Patient Details Name: Karen [name] MRN: [id number] DOB: 06-29-1945   Cancelled Treatment:    Reason Eval/Treat Not Completed: Fatigue/lethargy limiting ability to participate Patient sleeping soundly on PT arrival. Patient did not respond to name nor touch, but is observed to be breathing and in a deep state of sleep. Chart reviewed. PT will attempt at a later time/date.   Myles Gip PT, DPT [phone] 06/27/2018, 1:37 PM

## 2018-06-27 NOTE — Progress Notes (Signed)
[facility] [place] NAME: Karen [name]    MR#:  [id number]  DATE OF BIRTH:  March 18, 1945  SUBJECTIVE:  CHIEF COMPLAINT:   Chief Complaint  Patient presents with  . Shortness of Breath  Patient seen and evaluated today Has shortness of breath Desaturated when patient was transferred from the bed to the chair to 84% On oxygen via nasal cannula at 4 L, patient on oxygen via nasal cannula at 3 L at baseline at home . No complaints of chest pain No complaints of chest  REVIEW OF SYSTEMS:    ROS  CONSTITUTIONAL: No documented fever. No fatigue, weakness. No weight gain, no weight loss.  EYES: No blurry or double vision.  ENT: No tinnitus. No postnasal drip. No redness of the oropharynx.  RESPIRATORY: No cough, no wheeze, no hemoptysis. Has dyspnea.  CARDIOVASCULAR: No chest pain. No orthopnea. No palpitations. No syncope.  GASTROINTESTINAL: No nausea, no vomiting or diarrhea. No abdominal pain. No melena or hematochezia.  GENITOURINARY: No dysuria or hematuria.  ENDOCRINE: No polyuria or nocturia. No heat or cold intolerance.  HEMATOLOGY: No anemia. No bruising. No bleeding.  INTEGUMENTARY: No rashes. No lesions.  MUSCULOSKELETAL: No arthritis. No swelling. No gout.  NEUROLOGIC: No numbness, tingling, or ataxia. No seizure-type activity.  PSYCHIATRIC: No anxiety. No insomnia. No ADD.   DRUG ALLERGIES:   Allergies  Allergen Reactions  . Ace Inhibitors   . Gabapentin Hives  . Lisinopril   . Lyrica [Pregabalin]   . Shrimp [Shellfish Allergy] Swelling    Swelling of the lips    VITALS:  Blood pressure 139/60, pulse 87, temperature 98.2 F (36.8 C), temperature source Oral, resp. rate 20, height 5\' 3"  (1.6 m), weight 102.6 kg, SpO2 94 %.  PHYSICAL EXAMINATION:   Physical Exam  GENERAL:  73 y.o.-year-old patient lying in the bed with no acute distress.  EYES: Pupils equal, round, reactive to light and accommodation. No scleral  icterus. Extraocular muscles intact.  HEENT: Head atraumatic, normocephalic. Oropharynx and nasopharynx clear.  NECK:  Supple, no jugular venous distention. No thyroid enlargement, no tenderness.  LUNGS: Decreased breath sounds bilaterally, bibasilar crepitations heard. No use of accessory muscles of respiration.  CARDIOVASCULAR: S1, S2 normal. No murmurs, rubs, or gallops.  ABDOMEN: Soft, nontender, nondistended. Bowel sounds present. No organomegaly or mass.  EXTREMITIES: No cyanosis, clubbing  Has edema b/l.    NEUROLOGIC: Cranial nerves II through XII are intact. No focal Motor or sensory deficits b/l.   PSYCHIATRIC: The patient is alert and oriented x 3.  SKIN: No obvious rash, lesion, or ulcer.   LABORATORY PANEL:   CBC Recent Labs  Lab 06/27/18 0425  WBC 10.7*  HGB 11.8*  HCT 40.5  PLT 269   ------------------------------------------------------------------------------------------------------------------ Chemistries  Recent Labs  Lab 06/25/18 1003 06/26/18 0514 06/27/18 0425  NA 137 142 138  K 4.8 4.0 4.2  CL 97* 100 90*  CO2 27 34* 38*  GLUCOSE 266* 134* 214*  BUN 20 17 18   CREATININE 0.80 0.79 0.73  CALCIUM 8.9 9.0 8.8*  MG  --  2.0  --   AST 22  --   --   ALT 13  --   --   ALKPHOS 68  --   --   BILITOT 0.4  --   --    ------------------------------------------------------------------------------------------------------------------  Cardiac Enzymes Recent Labs  Lab 06/26/18 0514  TROPONINI <0.03   ------------------------------------------------------------------------------------------------------------------  RADIOLOGY:  Dg Chest Portable 1 View  Result Date: 06/25/2018 CLINICAL DATA:  72 year old with acute onset of shortness of breath and nonproductive cough that began this morning, associated with BILATERAL LOWER extremity edema. Hypoxemia with oxygen saturations in UPPER 80s. Current history of oxygen dependent COPD. EXAM: PORTABLE CHEST 1 VIEW  COMPARISON:  02/04/2018 and earlier, including CTA chest 06/23/2016. FINDINGS: Suboptimal inspiration accounts for crowded bronchovascular markings, especially in the bases, and accentuates the cardiac silhouette. Taking this into account, cardiac silhouette mildly to moderately enlarged, unchanged. Pulmonary venous hypertension without overt edema. Lungs clear. No confluent or patchy airspace consolidation. No pleural effusions. IMPRESSION: Suboptimal inspiration. Stable cardiomegaly. Pulmonary venous hypertension without overt edema. No acute cardiopulmonary disease. Electronically Signed   By: Evangeline Dakin M.D.   On: 06/25/2018 10:13     ASSESSMENT AND PLAN:  73 year old female patient with history of COPD, chronic respiratory failure on oxygen by nasal cannula at 3 L currently under hospitalist service for dyspnea.  COVID-19 test negative.  -Acute respiratory distress with hypoxia Secondary to heart failure exacerbation COVID-19 test negative Oxygen via nasal cannula at 4 L  -Acute on chronic diastolic congestive heart failure exacerbation improving slowly Diuresis with IV Lasix to continue Monitor input output chart Daily body weights Weight has decreased from 104 kg to 1 uppercase Monitor electrolytes Cardiology consult Follow-up echocardiogram  -Urinary tract infection with E. coli Continue IV Rocephin antibiotic  Culture and sensitivities reviewed  -Type 2 diabetes mellitus On insulin pump and diabetic diet  -COPD Continue inhaler therapy and oxygen via nasal cannula  -Hyperlipidemia Continue statin medication  All the records are reviewed and case discussed with Care Management/Social Worker. Management plans discussed with the patient, family and they are in agreement.  CODE STATUS: Full code  DVT Prophylaxis: SCDs  TOTAL TIME TAKING CARE OF THIS PATIENT: 36 minutes.   POSSIBLE D/C IN 2 to 3 DAYS, DEPENDING ON CLINICAL CONDITION.  Saundra Shelling M.D on  06/27/2018 at 9:53 AM  Between 7am to 6pm - Pager - [phone]  After 6pm go to [email or web address] - password [email or web address] [facility]  Office  [phone]  CC: Primary care physician; White, Orlene Och, NP  Note: This dictation was prepared with Dragon dictation along with smaller phrase technology. Any transcriptional errors that result from this process are unintentional.

## 2018-06-27 NOTE — Plan of Care (Signed)
  Problem: Education: Goal: Knowledge of General Education information will improve Description Including pain rating scale, medication(s)/side effects and non-pharmacologic comfort measures Outcome: Progressing   Problem: Health Behavior/Discharge Planning: Goal: Ability to manage health-related needs will improve Outcome: Progressing   Problem: Clinical Measurements: Goal: Respiratory complications will improve Outcome: Progressing Goal: Cardiovascular complication will be avoided Outcome: Progressing   Problem: Activity: Goal: Risk for activity intolerance will decrease Outcome: Progressing   Problem: Elimination: Goal: Will not experience complications related to bowel motility Outcome: Progressing   Problem: Activity: Goal: Capacity to carry out activities will improve Outcome: Progressing

## 2018-06-28 ENCOUNTER — Encounter: Payer: Self-pay | Admitting: Physician Assistant

## 2018-06-28 DIAGNOSIS — R112 Nausea with vomiting, unspecified: Secondary | ICD-10-CM

## 2018-06-28 DIAGNOSIS — I5033 Acute on chronic diastolic (congestive) heart failure: Secondary | ICD-10-CM

## 2018-06-28 LAB — CBC WITH DIFFERENTIAL/PLATELET
Abs Immature Granulocytes: 0.04 10*3/uL (ref 0.00–0.07)
Basophils Absolute: 0.1 10*3/uL (ref 0.0–0.1)
Basophils Relative: 1 %
Eosinophils Absolute: 0.1 10*3/uL (ref 0.0–0.5)
Eosinophils Relative: 1 %
HCT: 39.6 % (ref 36.0–46.0)
Hemoglobin: 11.8 g/dL — ABNORMAL LOW (ref 12.0–15.0)
Immature Granulocytes: 0 %
Lymphocytes Relative: 11 %
Lymphs Abs: 1.1 10*3/uL (ref 0.7–4.0)
MCH: 27.4 pg (ref 26.0–34.0)
MCHC: 29.8 g/dL — ABNORMAL LOW (ref 30.0–36.0)
MCV: 91.9 fL (ref 80.0–100.0)
Monocytes Absolute: 0.8 10*3/uL (ref 0.1–1.0)
Monocytes Relative: 8 %
Neutro Abs: 8.3 10*3/uL — ABNORMAL HIGH (ref 1.7–7.7)
Neutrophils Relative %: 79 %
Platelets: 265 10*3/uL (ref 150–400)
RBC: 4.31 MIL/uL (ref 3.87–5.11)
RDW: 15.5 % (ref 11.5–15.5)
WBC: 10.5 10*3/uL (ref 4.0–10.5)
nRBC: 0 % (ref 0.0–0.2)

## 2018-06-28 LAB — BASIC METABOLIC PANEL
Anion gap: 9 (ref 5–15)
BUN: 20 mg/dL (ref 8–23)
CO2: 38 mmol/L — ABNORMAL HIGH (ref 22–32)
Calcium: 9.1 mg/dL (ref 8.9–10.3)
Chloride: 89 mmol/L — ABNORMAL LOW (ref 98–111)
Creatinine, Ser: 0.64 mg/dL (ref 0.44–1.00)
GFR calc Af Amer: >60 mL/min (ref >60)
GFR calc non Af Amer: >60 mL/min (ref >60)
Glucose, Bld: 183 mg/dL — ABNORMAL HIGH (ref 70–99)
Potassium: 3.9 mmol/L (ref 3.5–5.1)
Sodium: 136 mmol/L (ref 135–145)

## 2018-06-28 LAB — GLUCOSE, CAPILLARY
Glucose-Capillary: 194 mg/dL — ABNORMAL HIGH (ref 70–99)
Glucose-Capillary: 263 mg/dL — ABNORMAL HIGH (ref 70–99)
Glucose-Capillary: 245 mg/dL — ABNORMAL HIGH (ref 70–99)
Glucose-Capillary: 218 mg/dL — ABNORMAL HIGH (ref 70–99)

## 2018-06-28 MED ORDER — INSULIN ASPART 100 UNIT/ML ~~LOC~~ SOLN
1000.0000 [IU] | Freq: Once | SUBCUTANEOUS | Status: DC
  Filled 2018-06-28: qty 10

## 2018-06-28 MED ORDER — FUROSEMIDE 40 MG PO TABS
40.0000 mg | ORAL_TABLET | Freq: Every day | ORAL | Status: DC
  Administered 2018-06-29 – 2018-06-30 (×2): 40 mg via ORAL
  Filled 2018-06-28 (×3): qty 1

## 2018-06-28 MED ORDER — POTASSIUM CHLORIDE CRYS ER 10 MEQ PO TBCR
10.0000 meq | EXTENDED_RELEASE_TABLET | Freq: Two times a day (BID) | ORAL | Status: DC
  Administered 2018-06-28 – 2018-06-30 (×5): 10 meq via ORAL
  Filled 2018-06-28 (×5): qty 1

## 2018-06-28 NOTE — Progress Notes (Signed)
Physical Therapy Treatment Patient Details Name: Karen [name] MRN: [id number] DOB: 1945/06/07 Today's Date: 06/28/2018    History of Present Illness 73 year old female patient with history of COPD, chronic respiratory failure on oxygen by nasal cannula at 3 L currently admitted for dyspnea.     PT Comments    Patient agreeable to work with PT, eager to mobilize to chair. Pt on 3L via [place], able to maintain with cues/reminders for PLB, however placed on 4L during ambulation and spO2 >90%. Pt able to ambulate to her door and back with RW and CGA, on 4L via [place], ~72ft. No unsteadiness noted, though pt did fatigue quickly. Pt also performed seated therapeutic exercises on 3L via [place], pt encouraged to perform those exercises throughout her day. Overall the patient demonstrated improved mobility and progress towards goals.     Follow Up Recommendations  Home health PT     Equipment Recommendations       Recommendations for Other Services       Precautions / Restrictions Precautions Precautions: Fall Restrictions Weight Bearing Restrictions: No    Mobility  Bed Mobility Overal bed mobility: Needs Assistance Bed Mobility: Supine to Sit     Supine to sit: Min assist     General bed mobility comments: PT reaches for PT hand, very little physical assist needed  Transfers Overall transfer level: Needs assistance Equipment used: Rolling walker (2 wheeled) Transfers: Sit to/from Stand Sit to Stand: Min guard         General transfer comment: x3 this session, occasional verbal cues for hand placement  Ambulation/Gait   Gait Distance (Feet): 15 Feet Assistive device: Rolling walker (2 wheeled)   Gait velocity: decreased   General Gait Details: Pt able to ambulate to her door and back with RW and CGA, on 4L via [place]. No unsteadiness noted, though pt did fatigue quickly.   Stairs             Wheelchair Mobility    Modified Rankin (Stroke Patients Only)        Balance Overall balance assessment: History of Falls                                          Cognition Arousal/Alertness: Awake/alert Behavior During Therapy: WFL for tasks assessed/performed Overall Cognitive Status: Within Functional Limits for tasks assessed                                 General Comments: a bit sleepy, but generally alert and oriented      Exercises Other Exercises Other Exercises: Pt taught ankle pumps, quad sets and glute squeezes x15 bilaterally on 3L via [place] Other Exercises: pt educated in tub transfer bench with handout provided (visuals, written instruction, and links to videos) for how to use    General Comments        Pertinent Vitals/Pain Pain Assessment: No/denies pain    Home Living Family/patient expects to be discharged to:: Private residence Living Arrangements: Spouse/significant other Available Help at Discharge: Family Type of Home: Mobile home Home Access: Stairs to enter Entrance Stairs-Rails: Can reach both Home Layout: One level Home Equipment: Grab bars - toilet;Toilet riser;Walker - 2 wheels;Cane - single point;Hand held shower head;Adaptive equipment      Prior Function Level of Independence: Independent with assistive device(s);Needs assistance  Gait / Transfers Assistance Needed: Patient using RW for >1 year independently. ADL's / Homemaking Assistance Needed: Spouse has been assisting with tub transfers and LB dressing tasks, spouse does grocery shopping and cooking     PT Goals (current goals can now be found in the care plan section) Acute Rehab PT Goals Patient Stated Goal: Go home Progress towards PT goals: Progressing toward goals    Frequency    Min 2X/week      PT Plan Current plan remains appropriate    Co-evaluation              AM-PAC PT "6 Clicks" Mobility   Outcome Measure  Help needed turning from your back to your side while in a flat bed without using  bedrails?: A Little Help needed moving from lying on your back to sitting on the side of a flat bed without using bedrails?: A Little Help needed moving to and from a bed to a chair (including a wheelchair)?: A Little Help needed standing up from a chair using your arms (e.g., wheelchair or bedside chair)?: A Little Help needed to walk in hospital room?: A Little Help needed climbing 3-5 steps with a railing? : A Little 6 Click Score: 18    End of Session Equipment Utilized During Treatment: Gait belt Activity Tolerance: Patient limited by fatigue Patient left: in bed;with call bell/phone within reach;with bed alarm set;with SCD's reapplied Nurse Communication: Mobility status PT Visit Diagnosis: Unsteadiness on feet (R26.81);Difficulty in walking, not elsewhere classified (R26.2);Muscle weakness (generalized) (M62.81);History of falling (Z91.81)     Time: 2197-5883 PT Time Calculation (min) (ACUTE ONLY): 25 min  Charges:  $Therapeutic Exercise: 23-37 mins                     Lieutenant Diego PT, DPT 1:16 PM,06/28/18 [phone]

## 2018-06-28 NOTE — Progress Notes (Signed)
[facility] [place] NAME: Karen [name]    MR#:  [id number]  DATE OF BIRTH:  March 19, 1945  SUBJECTIVE:  CHIEF COMPLAINT:   Chief Complaint  Patient presents with  . Shortness of Breath  Patient seen and evaluated today Has decreased shortness of breath Currently on oxygen via nasal cannula at 3 L No complaints of chest pain  REVIEW OF SYSTEMS:    ROS  CONSTITUTIONAL: No documented fever. No fatigue, weakness. No weight gain, no weight loss.  EYES: No blurry or double vision.  ENT: No tinnitus. No postnasal drip. No redness of the oropharynx.  RESPIRATORY: No cough, no wheeze, no hemoptysis. Has decreased dyspnea.  CARDIOVASCULAR: No chest pain. No orthopnea. No palpitations. No syncope.  GASTROINTESTINAL: No nausea, no vomiting or diarrhea. No abdominal pain. No melena or hematochezia.  GENITOURINARY: No dysuria or hematuria.  ENDOCRINE: No polyuria or nocturia. No heat or cold intolerance.  HEMATOLOGY: No anemia. No bruising. No bleeding.  INTEGUMENTARY: No rashes. No lesions.  MUSCULOSKELETAL: No arthritis. No swelling. No gout.  NEUROLOGIC: No numbness, tingling, or ataxia. No seizure-type activity.  PSYCHIATRIC: No anxiety. No insomnia. No ADD.   DRUG ALLERGIES:   Allergies  Allergen Reactions  . Ace Inhibitors   . Gabapentin Hives  . Lisinopril   . Lyrica [Pregabalin]   . Shrimp [Shellfish Allergy] Swelling    Swelling of the lips    VITALS:  Blood pressure 135/62, pulse 65, temperature 97.6 F (36.4 C), temperature source Oral, resp. rate 18, height 5\' 3"  (1.6 m), weight 100.7 kg, SpO2 94 %.  PHYSICAL EXAMINATION:   Physical Exam  GENERAL:  73 y.o.-year-old patient lying in the bed with no acute distress.  EYES: Pupils equal, round, reactive to light and accommodation. No scleral icterus. Extraocular muscles intact.  HEENT: Head atraumatic, normocephalic. Oropharynx and nasopharynx clear.  NECK:  Supple, no  jugular venous distention. No thyroid enlargement, no tenderness.  LUNGS: Improved breath sounds bilaterally, decreased bibasilar crepitations heard. No use of accessory muscles of respiration.  CARDIOVASCULAR: S1, S2 normal. No murmurs, rubs, or gallops.  ABDOMEN: Soft, nontender, nondistended. Bowel sounds present. No organomegaly or mass.  EXTREMITIES: No cyanosis, clubbing  Has edema b/l.    NEUROLOGIC: Cranial nerves II through XII are intact. No focal Motor or sensory deficits b/l.   PSYCHIATRIC: The patient is alert and oriented x 3.  SKIN: No obvious rash, lesion, or ulcer.   LABORATORY PANEL:   CBC Recent Labs  Lab 06/28/18 0408  WBC 10.5  HGB 11.8*  HCT 39.6  PLT 265   ------------------------------------------------------------------------------------------------------------------ Chemistries  Recent Labs  Lab 06/25/18 1003 06/26/18 0514  06/28/18 0408  NA 137 142   < > 136  K 4.8 4.0   < > 3.9  CL 97* 100   < > 89*  CO2 27 34*   < > 38*  GLUCOSE 266* 134*   < > 183*  BUN 20 17   < > 20  CREATININE 0.80 0.79   < > 0.64  CALCIUM 8.9 9.0   < > 9.1  MG  --  2.0  --   --   AST 22  --   --   --   ALT 13  --   --   --   ALKPHOS 68  --   --   --   BILITOT 0.4  --   --   --    < > =  values in this interval not displayed.   ------------------------------------------------------------------------------------------------------------------  Cardiac Enzymes Recent Labs  Lab 06/26/18 0514  TROPONINI <0.03   ------------------------------------------------------------------------------------------------------------------  RADIOLOGY:  No results found.   ASSESSMENT AND PLAN:  73 year old female patient with history of COPD, chronic respiratory failure on oxygen by nasal cannula at 3 L currently under hospitalist service for dyspnea.  COVID-19 test negative.  -Acute respiratory distress with hypoxia Secondary to heart failure exacerbation COVID-19 test  negative Oxygen via nasal cannula at 4 L  -Acute on chronic diastolic congestive heart failure exacerbation improving slowly Switch IV Lasix to oral Lasix for diuresis Monitor input output chart Daily body weights Monitor electrolytes Cardiology consult and follow-up appreciated Follow-up echocardiogram  -Urinary tract infection with E. coli Continue oral cephalexin antibiotic  -Type 2 diabetes mellitus On insulin pump and diabetic diet  -COPD Continue inhaler therapy and oxygen via nasal cannula  -Hyperlipidemia Continue statin medication  -Ambulatory dysfunction Physical therapy evaluation  All the records are reviewed and case discussed with Care Management/Social Worker. Management plans discussed with the patient, family and they are in agreement.  CODE STATUS: Full code  DVT Prophylaxis: SCDs  TOTAL TIME TAKING CARE OF THIS PATIENT: 35 minutes.   POSSIBLE D/C IN 1 to 2 DAYS, DEPENDING ON CLINICAL CONDITION.  Saundra Shelling M.D on 06/28/2018 at 11:36 AM  Between 7am to 6pm - Pager - [phone]  After 6pm go to [email or web address] - password [email or web address] [facility]  Office  [phone]  CC: Primary care physician; White, Orlene Och, NP  Note: This dictation was prepared with Dragon dictation along with smaller phrase technology. Any transcriptional errors that result from this process are unintentional.

## 2018-06-28 NOTE — Progress Notes (Addendum)
Cardiovascular and Pulmonary Nurse Navigator Note:    Rounded on patient again today to follow-up on the education on HF provided yesterday.  Patient lying in bed with HOB slightly elevated. Patient stating she feels better than she did yesterday.  Patient stated she is pleased and her doctor is pleased with the amount of fluid she has diuresed.    Yesterday, patient had informed this RN that she has functioning scale, but chooses not to weigh herself. Stressed to the patient the importance of weighing daily and communicating with her cardiologist or PCP when she has increase in fluid as demonstrated by rapid weight gain.  Stressed importance of communicating before things get to the point of having to be admitted.  Patient verbalized understanding.     Pulmonary Rehab / [facility] - informed patient she is a candidate for [facility]monary [facility]hab [facility] given her dx of COPD and CHF with EF of 50 - 55%.  Overview of the program provided.  Brochure given.  Temporary Close Letter given.  Encouraged patient to think about participating in the program.  Benefits of the program discussed.  Explained to patient Pulmonary Rehab is temporarily closed due to the COVID-19 pandemic.  Patient stated she would think about it.    Roanna Epley, RN, BSN, [facility]  Cardiovascular & Pulmonary Nurse Navigator  Direct Line: [phone]  Department Phone #: [phone] Fax: [phone]  Email Address: [email or web address]

## 2018-06-28 NOTE — Progress Notes (Signed)
Progress Note  Patient Name: Karen [name] Date of Encounter: 06/28/2018  Primary Cardiologist: [facility]   She continues to deny CP, palpitations, or racing heart rate. She reported emesis frequently all yesterday and through the night. She ordered a liquid dinner and was unable to keep this down. She also reported emesis after drinking water. She stated that her stomach is tender to palpation. She denies any back pain. She stated that she does not feel her breathing is changed from yesterday and is still not yet back at baseline.   Inpatient Medications    Scheduled Meds: . aspirin  81 mg Oral Daily  . atorvastatin  10 mg Oral Daily  . bisoprolol  5 mg Oral Daily  . cephALEXin  500 mg Oral Q8H  . docusate sodium  100 mg Oral BID  . enoxaparin (LOVENOX) injection  40 mg Subcutaneous BID  . furosemide  60 mg Intravenous Q12H  . insulin aspart  1,000 Units Subcutaneous Once  . insulin pump   Subcutaneous TID AC, HS, 0200  . ipratropium-albuterol  3 mL Nebulization TID  . losartan  100 mg Oral Daily  . sodium chloride flush  3 mL Intravenous Q12H   Continuous Infusions: . sodium chloride 100 mL (06/26/18 1314)   PRN Meds: sodium chloride, acetaminophen **OR** acetaminophen, albuterol, bisacodyl, ondansetron **OR** ondansetron (ZOFRAN) IV, traZODone   Vital Signs    Vitals:   06/27/18 2101 06/28/18 0402 06/28/18 0746 06/28/18 0752  BP:  (!) 145/61 135/62   Pulse:  71 65 65  Resp:  19  18  Temp:  97.6 F (36.4 C) 97.6 F (36.4 C)   TempSrc:  Oral Oral   SpO2: 95% 96% 94%   Weight:  100.7 kg    Height:        Intake/Output Summary (Last 24 hours) at 06/28/2018 0756 Last data filed at 06/28/2018 0024 Gross per 24 hour  Intake 620 ml  Output 2600 ml  Net -1980 ml   Filed Weights   06/26/18 0454 06/27/18 0407 06/28/18 0402  Weight: 104.1 kg 102.6 kg 100.7 kg    Telemetry    NSR, sinus pause 1.22 seconds, ectopy -  Personally Reviewed  ECG    No new tracings - Personally Reviewed  Physical Exam   GEN: Morbidly obese female. Lying in bed. Neck: JVD difficult to ascertain d/t body habitus Cardiac: RRR, 1/6 systolic murmur, no rubs, or gallops.  Respiratory: Scattered rhonchi and diffuse wheezing throughout bilateral lung fields. Reduced bilateral breath sounds at the bases.  GI: Obese, firm, TTP in all four quadrants.  MS: 1+ b/l lower extremity edema; b/l lower extremity is notable for erythema though improved from yesterday and not TTP  Neuro:  Nonfocal   Psych: Normal affect, pleasant   Labs    Chemistry Recent Labs  Lab 06/25/18 1003 06/26/18 0514 06/27/18 0425 06/28/18 0408  NA 137 142 138 136  K 4.8 4.0 4.2 3.9  CL 97* 100 90* 89*  CO2 27 34* 38* 38*  GLUCOSE 266* 134* 214* 183*  BUN 20 17 18 20   CREATININE 0.80 0.79 0.73 0.64  CALCIUM 8.9 9.0 8.8* 9.1  PROT 6.6  --   --   --   ALBUMIN 3.5  --   --   --   AST 22  --   --   --   ALT 13  --   --   --   ALKPHOS 68  --   --   --  BILITOT 0.4  --   --   --   GFRNONAA >60 >60 >60 >60  GFRAA >60 >60 >60 >60  ANIONGAP 13 8 10 9      Hematology Recent Labs  Lab 06/25/18 1003 06/26/18 0514 06/27/18 0425  WBC 10.1 11.0* 10.7*  RBC 3.70* 4.16 4.36  HGB 10.4* 11.5* 11.8*  HCT 34.0* 37.8 40.5  MCV 91.9 90.9 92.9  MCH 28.1 27.6 27.1  MCHC 30.6 30.4 29.1*  RDW 16.0* 16.2* 15.9*  PLT 156 260 269    Cardiac Enzymes Recent Labs  Lab 06/25/18 1003 06/25/18 1832 06/25/18 2245 06/26/18 0514  TROPONINI <0.03 <0.03 <0.03 <0.03   No results for input(s): TROPIPOC in the last 168 hours.   BNP Recent Labs  Lab 06/25/18 1003  BNP 150.0*     DDimer No results for input(s): DDIMER in the last 168 hours.   Radiology    No results found.  Cardiac Studies   TTE 06/26/2018 1. The left ventricle has low normal systolic function, with an ejection fraction of 50-55%. The cavity size was normal. Left ventricular diastolic  parameters were normal.  2. The right ventricle has normal systolic function. The cavity was normal. There is no increase in right ventricular wall thickness.  3. Mild thickening of the mitral valve leaflet.  4. The aortic valve is tricuspid. Moderate thickening of the aortic valve. Mild calcification of the aortic valve. Aortic valve regurgitation is trivial by color flow Doppler. Mild stenosis of the aortic valve.  Patient Profile     73 y.o. 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  Assessment & Plan    Acute on chronic HFpEF - Exam consistent with volume overload. SOB not yet at baseline on current 4L [place]. 1+ LEE with erythema and TTP.  - Etiology of SOB likely multifactorial given documented h/o HFpEF, poorly controlled HTN, COPD on home 3L O2, deconditioning /obesity. BNP 150.  - Echo as above- normal EF - Patient reports significant output at almost -8L for admission. Weight 229lbs  226lbs  221.9lbs. Continue to monitor I/O, daily standing weights.   - Daily BMET with close monitoring of renal function and electrolytes. Renal function currently stable 0.79  0.64. K 4.0  4.2  3.9. Continue to monitor renal function and electrolytes with ongoing emesis.  - Will transition lasix 60mg  q12h to po lasix due to ongoing emesis. - Started on bisoprolol yesterday. Continue.  - Continue home ARB. Documented allergy to ACEi.  - Will need discharged with home diuretic as renal function tolerates this admission and given elevated BP and HR. Risk factor modification and diet/exercise changes recommended.  Acute respiratory distress with hypoxia - Etiology likely multifactorial as above: COPD exacerbation verus acute on chronic HFpEF. COVID 19 negative. 3L home  oxygen (currently on 4L). Continue. Medication management as above.  HTN - SBP improved since yesterday with diuresis.135/62. As above, addition of BB yesterday and should continue as HR/BP/breathing tolerates and at discharge. Continue losartan. Will need discharged on home oral diuretic as above.   HLD - Continue statin. Consider recheck of LDL.  DM2 - Poorly controlled on insulin and metformin - A1C 7.7 - Per IM. Nutrition consult recommended.  UTI, Kidney stones - Abx. Per IM  Anemia -  Per IM. Daily CBC.  Hypokalemia - Resolved. Daily BMET.  COPD - Per IM. H/o smoking. Continue [place] oxygen.  For questions or updates, please contact [facility] Please consult [email or web address] for contact info under        Signed, Arvil Chaco, PA-C  06/28/2018, 7:56 AM

## 2018-06-28 NOTE — Plan of Care (Signed)
  Problem: Education: Goal: Knowledge of General Education information will improve Description Including pain rating scale, medication(s)/side effects and non-pharmacologic comfort measures Outcome: Progressing   Problem: Health Behavior/Discharge Planning: Goal: Ability to manage health-related needs will improve Outcome: Progressing   Problem: Clinical Measurements: Goal: Ability to maintain clinical measurements within normal limits will improve Outcome: Progressing Goal: Will remain free from infection Outcome: Progressing Note:  Remains on po antibiotics for UTI, no fever

## 2018-06-28 NOTE — Plan of Care (Signed)
  Problem: Clinical Measurements: Goal: Ability to maintain clinical measurements within normal limits will improve Outcome: Progressing Goal: Respiratory complications will improve Outcome: Progressing   Problem: Pain Managment: Goal: General experience of comfort will improve Outcome: Progressing   Problem: Safety: Goal: Ability to remain free from injury will improve Outcome: Progressing   Problem: Cardiac: Goal: Ability to achieve and maintain adequate cardiopulmonary perfusion will improve Outcome: Progressing

## 2018-06-28 NOTE — Evaluation (Signed)
Occupational Therapy Evaluation Patient Details Name: Karen [name] MRN: [id number] DOB: February 14, 1946 Today's Date: 06/28/2018    History of Present Illness 73 year old female patient with history of COPD, chronic respiratory failure on oxygen by nasal cannula at 3 L currently admitted for dyspnea.    Clinical Impression   Pt seen for OT evaluation this date. Pt lives in a mobile home with 4-5 steps to enter with tub/shower that has grab bars and a handheld shower head. Pt on 3 liters of O2 at home. Pt was modified independent using RW for mobility and required assist from spouse for tub transfers, groceries, cooking, and LB dressing tasks. Pt reports becoming easily fatigued or out of breath with minimize exertion, requiring increased assist. Pt currently requires minimal assist for LB ADL tasks. Per mobility tech, pt requires CGA for functional transfers and short distance mobility. Pt declined during OT evaluation 2/2 poor activity tolerance and fatigue at time of evaluation. Pt educated in energy conservation conservation strategies including pursed lip breathing, activity pacing, home/routines modifications, work simplification, AE/DME (including instruction in use of a tub transfer bench), prioritizing of meaningful occupations, and falls prevention. Handout provided. Pt verbalized understanding but would benefit from additional skilled OT services to maximize recall and carryover of learned techniques and facilitate implementation of learned techniques into daily routines. Upon discharge, recommend [facility] services.      Follow Up Recommendations  Home health OT    Equipment Recommendations  Tub/shower bench    Recommendations for Other Services       Precautions / Restrictions Precautions Precautions: Fall Restrictions Weight Bearing Restrictions: No      Mobility Bed Mobility Overal bed mobility: Needs Assistance             General bed mobility comments: Per mobility  tech, patient demonstrates need for CGA bed mobility and transfers. Patient too fatigued at this time to trial with OT   Transfers Overall transfer level: Needs assistance Equipment used: Rolling walker (2 wheeled) Transfers: Sit to/from Stand Sit to Stand: Min guard         General transfer comment: Per mobility tech, patient demonstrates need for CGA bed mobility and transfers. Patient too fatigued at this time to trial with OT    Balance Overall balance assessment: History of Falls                                         ADL either performed or assessed with clinical judgement   ADL Overall ADL's : Needs assistance/impaired                                       General ADL Comments: CGA for functional transfers, Min A for LB ADL      Vision Patient Visual Report: No change from baseline       Perception     Praxis      Pertinent Vitals/Pain Pain Assessment: No/denies pain     Hand Dominance Right   Extremity/Trunk Assessment Upper Extremity Assessment Upper Extremity Assessment: Generalized weakness;Overall [facility] for tasks assessed   Lower Extremity Assessment Lower Extremity Assessment: Generalized weakness;Overall WFL for tasks assessed       Communication Communication Communication: No difficulties   Cognition Arousal/Alertness: Awake/alert Behavior During Therapy: WFL for tasks assessed/performed Overall Cognitive Status: Within  Functional Limits for tasks assessed                                 General Comments: a bit sleepy, but generally alert and oriented   General Comments       Exercises Other Exercises Other Exercises: pt educated in energy conservation strategies including home/routines modifications, work simplification, AE/DME, activity pacing, and prioritizing of tasks (must do vs want to do), and pursed lip breathing Other Exercises: pt educated in tub transfer bench with handout  provided (visuals, written instruction, and links to videos) for how to use   Shoulder Instructions      Home Living Family/patient expects to be discharged to:: Private residence Living Arrangements: Spouse/significant other Available Help at Discharge: Family Type of Home: Mobile home Home Access: Stairs to enter Entrance Stairs-Number of Steps: 4-5 Entrance Stairs-Rails: Can reach both [place]: One level     Bathroom Shower/Tub: Tub/shower unit;Curtain   [place]: Grab bars - toilet;Toilet riser;Walker - 2 wheels;Cane - single point;Hand held shower head;Adaptive equipment Adaptive Equipment: [place]acher        Prior Functioning/Environment Level of Independence: Independent with assistive device(s);Needs assistance  Gait / Transfers Assistance Needed: Patient using RW for >1 year independently. ADL's / Homemaking Assistance Needed: Spouse has been assisting with tub transfers and LB dressing tasks, spouse does grocery shopping and cooking            OT Problem List: Decreased strength;Decreased activity tolerance;Decreased knowledge of use of DME or AE      OT Treatment/Interventions: Self-care/ADL training;Therapeutic exercise;Therapeutic activities;DME and/or AE instruction;Patient/family education    OT Goals(Current goals can be found in the care plan section) Acute Rehab OT Goals Patient Stated Goal: Go home OT Goal Formulation: With patient Time For Goal Achievement: 07/12/18 Potential to Achieve Goals: Good ADL Goals Pt Will Perform Lower Body Dressing: with supervision;sit to/from stand;with adaptive equipment Pt Will Perform Tub/Shower Transfer: with supervision;ambulating;tub bench Additional ADL Goal #1: Pt will verbalize plan to implement at least 2 learned energy conservation strategies into daily ADL routines at home. Additional ADL Goal #2: Pt will perform morning ADL routine with supervision and  utilizing learned energy conservation strategies requiring PRN verbal cues.  OT Frequency: Min 1X/week   Barriers to D/C:            Co-evaluation              AM-PAC OT "6 Clicks" Daily Activity     Outcome Measure Help from another person eating meals?: None Help from another person taking care of personal grooming?: None Help from another person toileting, which includes using toliet, bedpan, or urinal?: A Little Help from another person bathing (including washing, rinsing, drying)?: A Little Help from another person to put on and taking off regular upper body clothing?: None Help from another person to put on and taking off regular lower body clothing?: A Little 6 Click Score: 21   End of Session    Activity Tolerance: Patient limited by fatigue Patient left: in bed;with call bell/phone within reach;with bed alarm set  OT Visit Diagnosis: Other abnormalities of gait and mobility (R26.89);Muscle weakness (generalized) (M62.81)                Time: 4010-2725 OT Time Calculation (min): 18 min Charges:  OT General Charges $OT Visit: 1 Visit OT Evaluation $OT Eval  Low Complexity: 1 Low OT Treatments $Self Care/Home Management : 8-22 mins  Jeni Salles, MPH, MS, OTR/L ascom [phone] 06/28/18, 9:58 AM

## 2018-06-28 NOTE — Care Management Important Message (Signed)
Important Message  Patient Details  Name: Karen [name] MRN: [id number] Date of Birth: 11-27-1945   Medicare Important Message Given:  Yes    [name] 06/28/2018, 10:51 AM

## 2018-06-29 LAB — GLUCOSE, CAPILLARY
Glucose-Capillary: 223 mg/dL — ABNORMAL HIGH (ref 70–99)
Glucose-Capillary: 184 mg/dL — ABNORMAL HIGH (ref 70–99)
Glucose-Capillary: 265 mg/dL — ABNORMAL HIGH (ref 70–99)
Glucose-Capillary: 262 mg/dL — ABNORMAL HIGH (ref 70–99)
Glucose-Capillary: 325 mg/dL — ABNORMAL HIGH (ref 70–99)

## 2018-06-29 NOTE — Progress Notes (Signed)
Physical Therapy Treatment Patient Details Name: Karen [name] MRN: [id number] DOB: Mar 06, 1946 Today's Date: 06/29/2018    History of Present Illness 73 year old female patient with history of COPD, chronic respiratory failure on oxygen by nasal cannula at 3 L currently admitted for dyspnea.     PT Comments    Pt limited by fatigue this session, denied pain. On 3L via [place] throughout session, intermittent rest breaks needed due to SOB/fatigue. Pt performed sit<> stand with RW and shuffling steps towards bed with supervision. Supervision and step by step instructions for supine to sit and repositioning in bed provided, no physical assist needed (use of bed rails). Pt was able to perform supine exercises with verbal and tactile cues. Pt in bed with all needs in reach at end of session. The patient would benefit from further skilled PT to continue to maximize independence, mobility, and safety.     Follow Up Recommendations  Home health PT     Equipment Recommendations  Other (comment)    Recommendations for Other Services       Precautions / Restrictions Precautions Precautions: Fall Restrictions Weight Bearing Restrictions: No    Mobility  Bed Mobility Overal bed mobility: Needs Assistance       Supine to sit: Supervision;HOB elevated     General bed mobility comments: Pt able to reposition with use of bed rails and step by step instructions, no physical assist needed  Transfers Overall transfer level: Needs assistance Equipment used: Rolling walker (2 wheeled) Transfers: Sit to/from Stand Sit to Stand: Supervision         General transfer comment: With flexed trunk and fatigue, but no physical assistance needed  Ambulation/Gait                 Stairs             Wheelchair Mobility    Modified Rankin (Stroke Patients Only)       Balance Overall balance assessment: History of Falls                                           Cognition Arousal/Alertness: Awake/alert Behavior During Therapy: WFL for tasks assessed/performed Overall Cognitive Status: Within Functional Limits for tasks assessed                                        Exercises Other Exercises Other Exercises: in supine: ankle pumps, quad sets, hip abduction, SAQ x15 bilaterally with verbal cues    General Comments        Pertinent Vitals/Pain Pain Assessment: No/denies pain    Home Living                      Prior Function            PT Goals (current goals can now be found in the care plan section) Progress towards PT goals: Progressing toward goals    Frequency    Min 2X/week      PT Plan Current plan remains appropriate    Co-evaluation              AM-PAC PT "6 Clicks" Mobility   Outcome Measure  Help needed turning from your back to your side while in a flat bed without using bedrails?:  A Little Help needed moving from lying on your back to sitting on the side of a flat bed without using bedrails?: A Little Help needed moving to and from a bed to a chair (including a wheelchair)?: A Little Help needed standing up from a chair using your arms (e.g., wheelchair or bedside chair)?: A Little Help needed to walk in hospital room?: A Little Help needed climbing 3-5 steps with a railing? : A Little 6 Click Score: 18    End of Session Equipment Utilized During Treatment: Gait belt;Oxygen(3L ) Activity Tolerance: Patient limited by fatigue Patient left: in bed;with call bell/phone within reach;with bed alarm set;with SCD's reapplied Nurse Communication: Mobility status PT Visit Diagnosis: Unsteadiness on feet (R26.81);Difficulty in walking, not elsewhere classified (R26.2);Muscle weakness (generalized) (M62.81);History of falling (Z91.81)     Time: 5072-2575 PT Time Calculation (min) (ACUTE ONLY): 17 min  Charges:  $Therapeutic Exercise: 8-22 mins                     Lieutenant Diego PT, DPT 1:59 PM,06/29/18 [phone]

## 2018-06-29 NOTE — Progress Notes (Addendum)
[facility] [place] NAME: Karen [name]    MR#:  [id number]  DATE OF BIRTH:  1945/11/28  SUBJECTIVE: Patient states that she is feeling chills, body aches.  CHIEF COMPLAINT:   Chief Complaint  Patient presents with  . Shortness of Breath  Patient seen and evaluated today Has decreased shortness of breath Currently on oxygen via nasal cannula at 3 L No complaints of chest pain  REVIEW OF SYSTEMS:    ROS  CONSTITUTIONAL: No documented fever. No fatigue, weakness. No weight gain, no weight loss.  EYES: No blurry or double vision.  ENT: No tinnitus. No postnasal drip. No redness of the oropharynx.  RESPIRATORY: No cough, no wheeze, no hemoptysis. Has decreased dyspnea.  CARDIOVASCULAR: No chest pain. No orthopnea. No palpitations. No syncope.  GASTROINTESTINAL: No nausea, no vomiting or diarrhea. No abdominal pain. No melena or hematochezia.  GENITOURINARY: No dysuria or hematuria.  ENDOCRINE: No polyuria or nocturia. No heat or cold intolerance.  HEMATOLOGY: No anemia. No bruising. No bleeding.  INTEGUMENTARY: No rashes. No lesions.  MUSCULOSKELETAL: No arthritis. No swelling. No gout.  NEUROLOGIC: No numbness, tingling, or ataxia. No seizure-type activity.  PSYCHIATRIC: No anxiety. No insomnia. No ADD.   DRUG ALLERGIES:   Allergies  Allergen Reactions  . Ace Inhibitors   . Gabapentin Hives  . Lisinopril   . Lyrica [Pregabalin]   . Shrimp [Shellfish Allergy] Swelling    Swelling of the lips    VITALS:  Blood pressure (!) 143/61, pulse (!) 59, temperature 97.7 F (36.5 C), temperature source Oral, resp. rate (!) 22, height 5\' 3"  (1.6 m), weight 100.7 kg, SpO2 94 %.  PHYSICAL EXAMINATION:   Physical Exam  GENERAL:  73 y.o.-year-old patient lying in the bed with no acute distress.  EYES: Pupils equal, round, reactive to light and accommodation. No scleral icterus. Extraocular muscles intact.  HEENT: Head atraumatic,  normocephalic. Oropharynx and nasopharynx clear.  NECK:  Supple, no jugular venous distention. No thyroid enlargement, no tenderness.  LUNGS: Improved breath sounds bilaterally, decreased bibasilar crepitations heard. No use of accessory muscles of respiration.  CARDIOVASCULAR: S1, S2 normal. No murmurs, rubs, or gallops.  ABDOMEN: Soft, nontender, nondistended. Bowel sounds present. No organomegaly or mass.  EXTREMITIES: No cyanosis, clubbing  Has edema b/l.    NEUROLOGIC: Cranial nerves II through XII are intact. No focal Motor or sensory deficits b/l.   PSYCHIATRIC: The patient is alert and oriented x 3.  SKIN: No obvious rash, lesion, or ulcer.   LABORATORY PANEL:   CBC Recent Labs  Lab 06/28/18 0408  WBC 10.5  HGB 11.8*  HCT 39.6  PLT 265   ------------------------------------------------------------------------------------------------------------------ Chemistries  Recent Labs  Lab 06/25/18 1003 06/26/18 0514  06/28/18 0408  NA 137 142   < > 136  K 4.8 4.0   < > 3.9  CL 97* 100   < > 89*  CO2 27 34*   < > 38*  GLUCOSE 266* 134*   < > 183*  BUN 20 17   < > 20  CREATININE 0.80 0.79   < > 0.64  CALCIUM 8.9 9.0   < > 9.1  MG  --  2.0  --   --   AST 22  --   --   --   ALT 13  --   --   --   ALKPHOS 68  --   --   --   BILITOT 0.4  --   --   --    < > =  values in this interval not displayed.   ------------------------------------------------------------------------------------------------------------------  Cardiac Enzymes Recent Labs  Lab 06/26/18 0514  TROPONINI <0.03   ------------------------------------------------------------------------------------------------------------------  RADIOLOGY:  No results found.   ASSESSMENT AND PLAN:  73 year old female patient with history of COPD, chronic respiratory failure on oxygen by nasal cannula at 3 L currently under hospitalist service for dyspnea.  COVID-19 test negative.  -Acute respiratory distress with  hypoxia Secondary to heart failure exacerbation COVID-19 test negative Better, now back to 3 L of oxygen, patient chronically on oxygen at 3 L.  -Acute on chronic diastolic congestive heart failure exacerbation improving slowly Continue oral diuretics per Monitor input output chart Daily body weights Monitor electrolytes  -Urinary tract infection with E. coli Continue oral cephalexin antibiotic  -Type 2 diabetes mellitus On insulin pump and diabetic diet  -COPD Continue inhaler therapy and oxygen via nasal cannula  -Hyperlipidemia Continue statin medication  -Ambulatory dysfunction Physical therapy recommends Home health physical therapy.  All the records are reviewed and case discussed with Care Management/Social Worker. Management plans discussed with the patient, family and they are in agreement.  CODE STATUS: Full code  DVT Prophylaxis: SCDs  TOTAL TIME TAKING CARE OF THIS PATIENT: 35 minutes.   POSSIBLE D/C IN 1 to 2 DAYS, DEPENDING ON CLINICAL CONDITION.  Epifanio Lesches M.D on 06/29/2018 at 12:40 PM  Between 7am to 6pm - Pager - [phone]  After 6pm go to [email or web address] - password [email or web address] [facility]  Office  [phone]  CC: Primary care physician; White, Orlene Och, NP  Note: This dictation was prepared with Dragon dictation along with smaller phrase technology. Any transcriptional errors that result from this process are unintentional.

## 2018-06-29 NOTE — Progress Notes (Signed)
OT Cancellation Note  Patient Details Name: Karen [name] MRN: [id number] DOB: Sep 16, 1945   Cancelled Treatment:    Reason Eval/Treat Not Completed: Other (comment). Upon attempt, pt working with PT. Pt endorsing more fatigue today than previous date and states "I don't think I have anything left" when asked whether pt would be agreeable to OT re-attempting again this date. Will hold OT treatment today and re-attempt next date.   Jeni Salles, MPH, MS, OTR/L ascom [phone] 06/29/18, 1:48 PM

## 2018-06-29 NOTE — Progress Notes (Signed)
PT Cancellation Note  Patient Details Name: Karen [name] MRN: [id number] DOB: Jun 10, 1945   Cancelled Treatment:    Reason Eval/Treat Not Completed: Other (comment)(PT entered room pt sleeping, easily woken. Reported that she doesn't feel well this morning, and agreeable to attempt mobilization at a later point today. PT will follow up as able.)   Lieutenant Diego PT, DPT 9:46 AM,06/29/18 [phone]

## 2018-06-29 NOTE — Plan of Care (Signed)
  Problem: Education: Goal: Knowledge of General Education information will improve Description: Including pain rating scale, medication(s)/side effects and non-pharmacologic comfort measures Outcome: Progressing   Problem: Activity: Goal: Risk for activity intolerance will decrease Outcome: Progressing   Problem: Elimination: Goal: Will not experience complications related to urinary retention Outcome: Progressing   Problem: Safety: Goal: Ability to remain free from injury will improve Outcome: Progressing   

## 2018-06-30 LAB — GLUCOSE, CAPILLARY
Glucose-Capillary: 215 mg/dL — ABNORMAL HIGH (ref 70–99)
Glucose-Capillary: 198 mg/dL — ABNORMAL HIGH (ref 70–99)
Glucose-Capillary: 232 mg/dL — ABNORMAL HIGH (ref 70–99)

## 2018-06-30 MED ORDER — BISOPROLOL FUMARATE 5 MG PO TABS: 5.0000 mg | ORAL_TABLET | Freq: Every day | ORAL | 0 refills | Status: DC

## 2018-06-30 MED ORDER — FUROSEMIDE 40 MG PO TABS: 40.0000 mg | ORAL_TABLET | Freq: Every day | ORAL | 0 refills | Status: DC

## 2018-06-30 NOTE — TOC Transition Note (Signed)
Transition of Care [facility]) - CM/SW Discharge Note   Patient Details  Name: Karen [name] MRN: [id number] Date of Birth: May 03, 1945  [facility] CM/SW Contact:  Su Hilt, RN Phone Number: 06/30/2018, 11:33 AM   Clinical Narrative:    Patient to DC home with [facility] Has a RW at home and does not need any other DME she states Transportation is provided by Husband  PAP ic Dr Dema Severin, Cardiologist is Dr. Rockey Situ Uses [facility] and can afford medication Patient states that she has no other needs  Final next level of care: [place] Barriers to Discharge: Barriers Resolved   Patient Goals and CMS Choice Patient states their goals for this hospitalization and ongoing recovery are:: go home CMS Medicare.gov Compare Post Acute Care list provided to:: Patient Choice offered to / list presented to : Patient  Discharge Placement                       Discharge Plan and Services   Discharge Planning Services: CM Consult                      HH Arranged: PT, Nurse's Aide [place] Agency: [facility] (formerly [employer]) Date [place]: 06/30/18 Time Roberta: 1131 Representative spoke with at [place]: [place] ([place]) Interventions     Readmission Risk Interventions Readmission Risk Prevention Plan 06/26/2018  Transportation Screening Complete  PCP or Specialist Appt within 5-7 Days Complete  Home Care Screening Complete  Medication Review (RN CM) Complete  Some recent data might be hidden

## 2018-06-30 NOTE — Progress Notes (Signed)
IV and tele removed from patient. Discharge instructions given to patient. Verbalized understanding. No acute distress. Son to transport patient home.

## 2018-07-01 ENCOUNTER — Telehealth: Payer: Self-pay | Admitting: Cardiovascular Disease

## 2018-07-01 NOTE — Telephone Encounter (Signed)

## 2018-07-02 ENCOUNTER — Telehealth: Payer: Self-pay

## 2018-07-02 NOTE — Telephone Encounter (Signed)
   TELEPHONE CALL NOTE  This patient has been deemed a candidate for follow-up tele-health visit to limit community exposure during the Covid-19 pandemic. I spoke with the patient via phone to discuss instructions. The patient was advised to review the section on consent for treatment as well. The patient will receive a phone call 2-3 days prior to their E-Visit at which time consent will be verbally confirmed. A Virtual Office Visit appointment type has been scheduled for 07/04/2018 with Darylene Price FNP.  Vonda Antigua L, CMA 07/02/2018 11:26 AM

## 2018-07-02 NOTE — Telephone Encounter (Signed)
TELEPHONE CALL NOTE  Karen [name] has been deemed a candidate for a follow-up tele-health visit to limit community exposure during the Covid-19 pandemic. I spoke with the patient via phone to ensure availability of phone/video source, confirm preferred email & phone number, discuss instructions and expectations, and review consent.   I reminded Karen [name] to be prepared with any vital sign and/or heart rhythm information that could potentially be obtained via home monitoring, at the time of her visit.  Finally, I reminded Karen [name] to expect an e-mail containing a link for their video-based visit approximately 15 minutes before her visit, or alternatively, a phone call at the time of her visit if her visit is planned to be a phone encounter.  Did the patient verbally consent to treatment as below? YES  Gaylord Shih, CMA 07/02/2018 11:26 AM  CONSENT FOR TELE-HEALTH VISIT - PLEASE REVIEW  I hereby voluntarily request, consent and authorize [facility] and its employed or contracted physicians, physician assistants, nurse practitioners or other licensed health care professionals (the Practitioner), to provide me with telemedicine health care services (the "Services") as deemed necessary by the treating Practitioner. I acknowledge and consent to receive the Services by the Practitioner via telemedicine. I understand that the telemedicine visit will involve communicating with the Practitioner through telephonic communication technology and the disclosure of certain medical information by electronic transmission. I acknowledge that I have been given the opportunity to request an in-person assessment or other available alternative prior to the telemedicine visit and am voluntarily participating in the telemedicine visit.  I understand that I have the right to withhold or withdraw my consent to the use of telemedicine in the course of my care at any time, without affecting  my right to future care or treatment, and that the Practitioner or I may terminate the telemedicine visit at any time. I understand that I have the right to inspect all information obtained and/or recorded in the course of the telemedicine visit and may receive copies of available information for a reasonable fee.  I understand that some of the potential risks of receiving the Services via telemedicine include:  Marland Kitchen Delay or interruption in medical evaluation due to technological equipment failure or disruption; . Information transmitted may not be sufficient (e.g. poor resolution of images) to allow for appropriate medical decision making by the Practitioner; and/or  . In rare instances, security protocols could fail, causing a breach of personal health information.  Furthermore, I acknowledge that it is my responsibility to provide information about my medical history, conditions and care that is complete and accurate to the best of my ability. I acknowledge that Practitioner's advice, recommendations, and/or decision may be based on factors not within their control, such as incomplete or inaccurate data provided by me or lack of visual representation. I understand that the practice of medicine is not an exact science and that Practitioner makes no warranties or guarantees regarding treatment outcomes. I acknowledge that I will receive a copy of this consent concurrently upon execution via email to the email address I last provided but may also request a printed copy by calling the office of [facility].    I understand that my insurance may be billed for this visit.   I have read or had this consent read to me. . I understand the contents of this consent, which adequately explains the benefits and risks of the Services being provided via telemedicine.  Marland Kitchen  I have been provided ample opportunity to ask questions regarding this consent and the Services and have had my questions answered to my  satisfaction. . I give my informed consent for the services to be provided through the use of telemedicine in my medical care  By participating in this telemedicine visit I agree to the above.

## 2018-07-04 ENCOUNTER — Other Ambulatory Visit: Payer: Self-pay

## 2018-07-04 ENCOUNTER — Ambulatory Visit: Payer: Medicare Other | Attending: Family | Admitting: Family

## 2018-07-04 VITALS — Wt 216.0 lb

## 2018-07-04 DIAGNOSIS — I10 Essential (primary) hypertension: Secondary | ICD-10-CM

## 2018-07-04 DIAGNOSIS — I5032 Chronic diastolic (congestive) heart failure: Secondary | ICD-10-CM

## 2018-07-04 DIAGNOSIS — E119 Type 2 diabetes mellitus without complications: Secondary | ICD-10-CM

## 2018-07-04 DIAGNOSIS — Z794 Long term (current) use of insulin: Secondary | ICD-10-CM

## 2018-07-04 DIAGNOSIS — R197 Diarrhea, unspecified: Secondary | ICD-10-CM

## 2018-07-04 NOTE — Progress Notes (Signed)
Virtual Visit via Telephone Note    Evaluation Performed:  Initial visit  This visit type was conducted due to national recommendations for restrictions regarding the COVID-19 Pandemic (e.g. social distancing).  This format is felt to be most appropriate for this patient at this time.  All issues noted in this document were discussed and addressed.  No physical exam was performed (except for noted visual exam findings with Video Visits).  Please refer to the patient's chart (MyChart message for video visits and phone note for telephone visits) for the patient's consent to telehealth for [facility]  Date:  07/04/2018   ID:  Karen [name], DOB 1946/02/07, MRN [id number]  Patient Location: [address] [place] [address]   Provider location:   [facility] [place] [address], [place] [address]  PCP:  Ricardo Jericho, NP  Cardiologist:  Ida Rogue, MD  Electrophysiologist:  None   Chief Complaint:  Fatigue  History of Present Illness:    Karen [name] is a 73 y.o. female who presents via audio/video conferencing for a telehealth visit today.  Patient verified DOB and address.  The patient does not have symptoms concerning for COVID-19 infection (fever, chills, cough, or new SHORTNESS OF BREATH).   Patient reports very little fatigue upon exertion. She says that this has been chronic in nature for several years. She currently has diarrhea along with this. She denies any dizziness, pedal edema, abdominal distention, chest pain, shortness of breath, cough, difficulty sleeping or weight gain. She wears oxygen at 3L around the clock.   Prior CV studies:   The following studies were reviewed today:  Echo report from 06/25/2018 reviewed and showed an EF of 50-55% along with mild AS.   Past Medical History:  Diagnosis Date  . (HFpEF) heart failure with preserved ejection fraction ([place]) 2017   (1) TTE 2017 a. EF 50% b. mild LVH c.  mild MR/TR, d. mild LAE e. mild pulmonary HTN  (2) TTE 06/2018 a. EF 50-55%, mild AS with thickening of valve   . Acute on chronic respiratory failure with hypoxia and hypercapnia ([place]) 01/07/2015  . Anemia   . 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 Asthma   . Cataract   . Chronic kidney disease 08/10/2017  . CKD (chronic kidney disease)   . COPD (chronic obstructive pulmonary disease) ([place])    (1) 06/2018 tobacco use, home 3L oxygen   . Diabetes mellitus without complication (HCC)    (1) A1C 7.7 (06/2018)  . Edema, peripheral 04/20/2014  . History of kidney stones   . Hyperlipidemia   . Hypertension   . Iron deficiency anemia 06/22/2014  . Leucocytosis 10/19/2015  . Overactive bladder   . Primary osteoarthritis of right knee 09/01/2016  . Renal insufficiency   . Sciatica 01/07/2015   Past Surgical History:  Procedure Laterality Date  . APPENDECTOMY    . CESAREAN SECTION     x3  . CHOLECYSTECTOMY    . COLONOSCOPY WITH PROPOFOL N/A 08/28/2017   Procedure: COLONOSCOPY WITH PROPOFOL;  Surgeon: Lucilla Lame, MD;  Location: [facility] ENDOSCOPY;  Service: Endoscopy;  Laterality: N/A;  . COLONOSCOPY WITH PROPOFOL N/A 08/29/2017   Procedure: COLONOSCOPY WITH PROPOFOL;  Surgeon: Lucilla Lame, MD;  Location: [facility] ENDOSCOPY;  Service: Endoscopy;  Laterality: N/A;  . CYSTOSCOPY W/ URETERAL STENT PLACEMENT Right 09/15/2017   Procedure: CYSTOSCOPY WITH RETROGRADE PYELOGRAM/URETERAL STENT PLACEMENT;  Surgeon: Cleon Gustin, MD;  Location: [facility] ORS;  Service: Urology;  Laterality: Right;  . CYSTOSCOPY/URETEROSCOPY/HOLMIUM LASER/STENT PLACEMENT Right 10/09/2017   Procedure: CYSTOSCOPY/URETEROSCOPY/HOLMIUM LASER/STENT PLACEMENT;  Surgeon: Abbie Sons, MD;  Location: [facility] ORS;  Service: Urology;  Laterality: Right;  right Stent exchange  . EYE SURGERY       Current Meds  Medication Sig  . aspirin 81 MG chewable tablet Chew 81 mg by mouth daily. On hold  . atorvastatin (LIPITOR) 10 MG tablet Take 1 tablet by mouth  daily.  . bisoprolol (ZEBETA) 5 MG tablet Take 1 tablet (5 mg total) by mouth daily.  . canagliflozin (INVOKANA) 100 MG TABS tablet Take 1 tablet by mouth daily.  Marland Kitchen estradiol (ESTRACE VAGINAL) 0.1 MG/GM vaginal cream Apply 0.44m (pea-sized amount)  just inside the vaginal introitus with a finger-tip on Monday, Wednesday and Friday nights. (Patient taking differently: daily. .Marland Kitchen  . Ferrous Sulfate (IRON) 325 (65 Fe) MG TABS Take 1 tablet by mouth daily.  . fluticasone furoate-vilanterol (BREO ELLIPTA) 100-25 MCG/INH AEPB Inhale 1 puff into the lungs daily.  . Fluticasone-Umeclidin-Vilant 100-62.5-25 MCG/INH AEPB Inhale 1 puff into the lungs daily.  . furosemide (LASIX) 40 MG tablet Take 1 tablet (40 mg total) by mouth daily.  .Marland KitchenHUMALOG 100 UNIT/ML injection Inject 76 Units into the skin daily. In VGO device  . Insulin Disposable Pump (V-GO 40) KIT Inject into the skin as directed.  . Insulin Human (INSULIN PUMP) SOLN Inject 1 each into the skin 3 times daily with meals, bedtime and 2 AM. (Patient taking differently: Inject 1 each into the skin 3 times daily with meals, bedtime and 2 AM. Pt uses Novolog)  . losartan (COZAAR) 100 MG tablet Take 100 mg by mouth daily.  . metFORMIN (GLUCOPHAGE) 500 MG tablet Take 1,000 mg by mouth 2 (two) times daily with a meal.   . montelukast (SINGULAIR) 10 MG tablet Take 10 mg by mouth at bedtime.  . OXYGEN Place 3 L/min into the nose.   . solifenacin (VESICARE) 10 MG tablet Take 1 tablet (10 mg total) by mouth daily.  . vitamin B-12 (CYANOCOBALAMIN) 500 MCG tablet Take 500 mcg by mouth daily.     Allergies:   Ace inhibitors; Gabapentin; Lisinopril; Lyrica [pregabalin]; and Shrimp [shellfish allergy]   Social History   Tobacco Use  . Smoking status: Former Smoker    Last attempt to quit: 12/04/1992    Years since quitting: 25.5  . Smokeless tobacco: Never Used  Substance Use Topics  . Alcohol use: No  . Drug use: No     Family Hx: The patient's family  history includes Other in her father and mother.  ROS:   Please see the history of present illness.     All other systems reviewed and are negative.   Labs/Other Tests and Data Reviewed:    Recent Labs: 06/25/2018: ALT 13; B Natriuretic Peptide 150.0 06/26/2018: Magnesium 2.0 06/28/2018: BUN 20; Creatinine, Ser 0.64; Hemoglobin 11.8; Platelets 265; Potassium 3.9; Sodium 136   Recent Lipid Panel No results found for: CHOL, TRIG, HDL, CHOLHDL, LDLCALC, LDLDIRECT  Wt Readings from Last 3 Encounters:  07/04/18 216 lb (98 kg)  06/30/18 219 lb 4.8 oz (99.5 kg)  06/25/18 224 lb (101.6 kg)     Exam:    Vital Signs:  Wt 216 lb (98 kg) Comment: self-reported  BMI 38.26 kg/m    Well nourished, well developed female in no  acute distress.   ASSESSMENT & PLAN:    1. Chronic heart failure with preserved ejection fraction- - NYHA  class II - euvolemic today based on patient's description of symptoms - weighing daily and she was instructed to call for an overnight weight gain of >2 pounds or a weekly weight gain of >5 pounds - not adding salt and says that she's using No-Salt for seasoning - wearing oxygen at 3L around the clock and says that she's sleeping well. - has telemedicine visit scheduled with cardiology Rockey Situ) tomorrow - BNP 06/25/2018 was 150.0 - discussed paramedicine with patient and she is interested so will send that referral in  2: HTN- - hasn't checked her BP today - follows with Eulogio Bear, NP for her primary care needs; was supposed to go in to see her yesterday but cancelled due to diarrhea; will be re-scheduled - BMP from 06/28/2018 reviewed and showed sodium 136, potassium 3.9, creatinine 0.64 and GFR >60  3: Diabetes-   - glucose this morning was 150 - has insulin pump - saw endocrinologist Pasty Arch) 04/04/2018  4: Diarrhea- - says that this has been present for a few days - denies fever - reinforced the importance of monitoring her glucose and  staying hydrated during this time  COVID-19 Education: The signs and symptoms of COVID-19 were discussed with the patient and how to seek care for testing (follow up with PCP or arrange E-visit).  The importance of social distancing was discussed today.  Patient Risk:   After full review of this patients clinical status, I feel that they are at least moderate risk at this time.  Time:   Today, I have spent 11 minutes with the patient with telehealth technology discussing medications, daily weight and symptoms to report.     Medication Adjustments/Labs and Tests Ordered: Current medicines are reviewed at length with the patient today.  Concerns regarding medicines are outlined above.   Tests Ordered: No orders of the defined types were placed in this encounter.  Medication Changes: No orders of the defined types were placed in this encounter.   Disposition: 2 weeks or sooner for any questions/problems before then.   Signed, Alisa Graff, FNP  07/04/2018 11:09 AM    [facility]

## 2018-07-04 NOTE — Discharge Summary (Signed)
Karen [name], is a 73 y.o. female  DOB 18-Jan-1946  MRN [id number].  Admission date:  06/25/2018  Admitting Physician  Epifanio Lesches, MD  Discharge Date:  06/30/2018   Primary MD  Ricardo Jericho, NP  Recommendations for primary care physician for things to follow:   Follow with PCP in 1 week   Admission Diagnosis  Acute on chronic respiratory failure with hypoxia (HCC) [J96.21] Type 2 diabetes mellitus without complication, with long-term current use of insulin (HCC) [E11.9, Z79.4] Stage 4 chronic kidney disease ([place]) [N18.4] Acute on chronic congestive heart failure, unspecified heart failure type ([place]) [I50.9]   Discharge Diagnosis  Acute on chronic respiratory failure with hypoxia (HCC) [J96.21] Type 2 diabetes mellitus without complication, with long-term current use of insulin (HCC) [E11.9, Z79.4] Stage 4 chronic kidney disease ([place]) [N18.4] Acute on chronic congestive heart failure, unspecified heart failure type ([place]) [I50.9]    Active Problems:   Acute on chronic respiratory failure (HCC)      Past Medical History:  Diagnosis Date  . (HFpEF) heart failure with preserved ejection fraction ([place]) 2017   (1) TTE 2017 a. EF 50% b. mild LVH c. mild MR/TR, d. mild LAE e. mild pulmonary HTN  (2) TTE 06/2018 a. EF 50-55%, mild AS with thickening of valve   . Acute on chronic respiratory failure with hypoxia and hypercapnia ([place]) 01/07/2015  . Anemia   . 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 Asthma   . Cataract   . Chronic kidney disease 08/10/2017  . CKD (chronic kidney disease)   . COPD (chronic obstructive pulmonary disease) ([place])    (1) 06/2018 tobacco use, home 3L oxygen   . Diabetes mellitus without complication (HCC)    (1) A1C 7.7 (06/2018)  . Edema, peripheral 04/20/2014  . History of kidney stones   .  Hyperlipidemia   . Hypertension   . Iron deficiency anemia 06/22/2014  . Leucocytosis 10/19/2015  . Overactive bladder   . Primary osteoarthritis of right knee 09/01/2016  . Renal insufficiency   . Sciatica 01/07/2015    Past Surgical History:  Procedure Laterality Date  . APPENDECTOMY    . CESAREAN SECTION     x3  . CHOLECYSTECTOMY    . COLONOSCOPY WITH PROPOFOL N/A 08/28/2017   Procedure: COLONOSCOPY WITH PROPOFOL;  Surgeon: Lucilla Lame, MD;  Location: [facility] ENDOSCOPY;  Service: Endoscopy;  Laterality: N/A;  . COLONOSCOPY WITH PROPOFOL N/A 08/29/2017   Procedure: COLONOSCOPY WITH PROPOFOL;  Surgeon: Lucilla Lame, MD;  Location: [facility] ENDOSCOPY;  Service: Endoscopy;  Laterality: N/A;  . CYSTOSCOPY W/ URETERAL STENT PLACEMENT Right 09/15/2017   Procedure: CYSTOSCOPY WITH RETROGRADE PYELOGRAM/URETERAL STENT PLACEMENT;  Surgeon: Cleon Gustin, MD;  Location: [facility] ORS;  Service: Urology;  Laterality: Right;  . CYSTOSCOPY/URETEROSCOPY/HOLMIUM LASER/STENT PLACEMENT Right 10/09/2017   Procedure: CYSTOSCOPY/URETEROSCOPY/HOLMIUM LASER/STENT PLACEMENT;  Surgeon: Abbie Sons, MD;  Location: [facility] ORS;  Service: Urology;  Laterality: Right;  right Stent exchange  . EYE SURGERY         History of present illness and  Hospital Course:     Kindly see H&P for history of present illness and admission details, please review complete Labs, Consult reports and Test reports for all details in brief  HPI  from the history and physical done on the day of admission 73 year old female admitted for shortness of breath, chest heaviness, on admission O2 sats was 88% on 3 L, patient chronically on oxygen 3 L at home, increase the oxygen to 4  L, admitted to hospitalist service for CHF exacerbation.   Hospital Course  #1 acute on chronic respiratory failure secondary to CHF exacerbation: Improved with IV Lasix, weaned off oxygen to 3 L that she takes at home. 2.  Acute diastolic heart failure on chronic  diastolic heart failure, EF 50%, continued on Lasix. 3.  UTI, improved with Rocephin, discharged #4 COPD, Type 2 diabetes mellitus On insulin pump and diabetic diet Ambulatory dysfunction Physical therapy recommends Home health physical therapy.  Discharge Condition:stable   Follow UP  Follow-up Information    [facility] Follow up on 07/04/2018.   Specialty:  Cardiology Why:  at 11:40am This will be a virtual visit Contact information: [address] [place] [phone]       Ricardo Jericho, NP. Schedule an appointment as soon as possible for a visit in 1 week(s).   Specialty:  Family Medicine Contact information: [place] [address] [phone]        [name], MD .   Specialty:  Cardiology Contact information: [place] [address] [phone]             Discharge Instructions  and  Discharge Medications      Allergies as of 06/30/2018      Reactions   Ace Inhibitors    Gabapentin Hives   Lisinopril    Lyrica [pregabalin]    Shrimp [shellfish Allergy] Swelling   Swelling of the lips      Medication List    STOP taking these medications   hydrochlorothiazide 25 MG tablet Commonly known as:  HYDRODIURIL     TAKE these medications   aspirin 81 MG chewable tablet Chew 81 mg by mouth daily. On hold   atorvastatin 10 MG tablet Commonly known as:  LIPITOR Take 1 tablet by mouth daily.   bisoprolol 5 MG tablet Commonly known as:  ZEBETA Take 1 tablet (5 mg total) by mouth daily.   Breo Ellipta 100-25 MCG/INH Aepb Generic drug:  fluticasone furoate-vilanterol Inhale 1 puff into the lungs daily.   estradiol 0.1 MG/GM vaginal cream Commonly known as:  ESTRACE VAGINAL Apply 0.22m (pea-sized amount)  just inside the vaginal introitus with a finger-tip on Monday, Wednesday and Friday  nights. What changed:    when to take this  additional instructions   Fluticasone-Umeclidin-Vilant 100-62.5-25 MCG/INH Aepb Inhale 1 puff into the lungs daily.   furosemide 40 MG tablet Commonly known as:  LASIX Take 1 tablet (40 mg total) by mouth daily.   HumaLOG 100 UNIT/ML injection Generic drug:  insulin lispro Inject 76 Units into the skin daily. In VGO device   insulin pump Soln Inject 1 each into the skin 3 times daily with meals, bedtime and 2 AM. What changed:  additional instructions   Invokana 100 MG Tabs tablet Generic drug:  canagliflozin Take 1 tablet by mouth daily.   Iron 325 (65 Fe) MG Tabs Take 1 tablet by mouth daily.   losartan 100 MG tablet Commonly known as:  COZAAR Take 100 mg by mouth daily.   metFORMIN 500 MG tablet Commonly known as:  GLUCOPHAGE Take 1,000 mg by mouth 2 (two) times daily with a meal.   montelukast 10 MG tablet Commonly known as:  SINGULAIR Take 10 mg by mouth at bedtime.   OXYGEN Place 3 L/min into the nose.   solifenacin 10 MG tablet Commonly known  as:  VESICARE Take 1 tablet (10 mg total) by mouth daily.   V-Go 40 Kit Inject into the skin as directed.   vitamin B-12 500 MCG tablet Commonly known as:  CYANOCOBALAMIN Take 500 mcg by mouth daily.         Diet and Activity recommendation: See Discharge Instructions above   Consults obtained - cardio   Major procedures and Radiology Reports - PLEASE review detailed and final reports for all details, in brief -      Dg Chest Portable 1 View  Result Date: 06/25/2018 CLINICAL DATA:  73 year old with acute onset of shortness of breath and nonproductive cough that began this morning, associated with BILATERAL LOWER extremity edema. Hypoxemia with oxygen saturations in UPPER 80s. Current history of oxygen dependent COPD. EXAM: PORTABLE CHEST 1 VIEW COMPARISON:  02/04/2018 and earlier, including CTA chest 06/23/2016. FINDINGS: Suboptimal inspiration accounts  for crowded bronchovascular markings, especially in the bases, and accentuates the cardiac silhouette. Taking this into account, cardiac silhouette mildly to moderately enlarged, unchanged. Pulmonary venous hypertension without overt edema. Lungs clear. No confluent or patchy airspace consolidation. No pleural effusions. IMPRESSION: Suboptimal inspiration. Stable cardiomegaly. Pulmonary venous hypertension without overt edema. No acute cardiopulmonary disease. Electronically Signed   By: Evangeline Dakin M.D.   On: 06/25/2018 10:13    Micro Results    Recent Results (from the past 240 hour(s))  SARS Coronavirus 2 [facility] order, Performed in [facility] hospital lab)     Status: None   Collection Time: 06/25/18 10:03 AM  Result Value Ref Range Status   SARS Coronavirus 2 NEGATIVE NEGATIVE Final    Comment: (NOTE) If result is NEGATIVE SARS-CoV-2 target nucleic acids are NOT DETECTED. The SARS-CoV-2 RNA is generally detectable in upper and lower  respiratory specimens during the acute phase of infection. The lowest  concentration of SARS-CoV-2 viral copies this assay can detect is 250  copies / mL. A negative result does not preclude SARS-CoV-2 infection  and should not be used as the sole basis for treatment or other  patient management decisions.  A negative result may occur with  improper specimen collection / handling, submission of specimen other  than nasopharyngeal swab, presence of viral mutation(s) within the  areas targeted by this assay, and inadequate number of viral copies  (<250 copies / mL). A negative result must be combined with clinical  observations, patient history, and epidemiological information. If result is POSITIVE SARS-CoV-2 target nucleic acids are DETECTED. The SARS-CoV-2 RNA is generally detectable in upper and lower  respiratory specimens dur ing the acute phase of infection.  Positive  results are indicative of active infection with SARS-CoV-2.  Clinical   correlation with patient history and other diagnostic information is  necessary to determine patient infection status.  Positive results do  not rule out bacterial infection or co-infection with other viruses. If result is PRESUMPTIVE POSTIVE SARS-CoV-2 nucleic acids MAY BE PRESENT.   A presumptive positive result was obtained on the submitted specimen  and confirmed on repeat testing.  While 2019 novel coronavirus  (SARS-CoV-2) nucleic acids may be present in the submitted sample  additional confirmatory testing may be necessary for epidemiological  and / or clinical management purposes  to differentiate between  SARS-CoV-2 and other Sarbecovirus currently known to infect humans.  If clinically indicated additional testing with an alternate test  methodology [id number]) is advised. The SARS-CoV-2 RNA is generally  detectable in upper and lower respiratory sp ecimens during the acute  phase  of infection. The expected result is Negative. Fact Sheet for Patients:  StrictlyIdeas.no Fact Sheet for Healthcare Providers: BankingDealers.co.za This test is not yet approved or cleared by the [place] FDA and has been authorized for detection and/or diagnosis of SARS-CoV-2 by FDA under an Emergency Use Authorization (EUA).  This EUA will remain in effect (meaning this test can be used) for the duration of the COVID-19 declaration under Section 564(b)(1) of the Act, 21 U.S.C. section 360bbb-3(b)(1), unless the authorization is terminated or revoked sooner. Performed at [facility], [address] [place], [place] [address]   Urine Culture     Status: Abnormal   Collection Time: 06/25/18 10:36 AM  Result Value Ref Range Status   Specimen Description   Final    URINE, RANDOM Performed at [facility], [address] [place], [place] [address]    Special Requests   Final    NONE Performed at [facility], [place], [place] [address]    Culture >=100,000 COLONIES/mL ESCHERICHIA COLI (A)  Final   Report Status 06/27/2018 FINAL  Final   Organism ID, Bacteria ESCHERICHIA COLI (A)  Final      Susceptibility   Escherichia coli - MIC*    AMPICILLIN >=32 RESISTANT Resistant     CEFAZOLIN <=4 SENSITIVE Sensitive     CEFTRIAXONE <=1 SENSITIVE Sensitive     CIPROFLOXACIN 0.5 SENSITIVE Sensitive     GENTAMICIN <=1 SENSITIVE Sensitive     IMIPENEM <=0.25 SENSITIVE Sensitive     NITROFURANTOIN <=16 SENSITIVE Sensitive     TRIMETH/SULFA >=320 RESISTANT Resistant     AMPICILLIN/SULBACTAM 16 INTERMEDIATE Intermediate     PIP/TAZO <=4 SENSITIVE Sensitive     Extended ESBL NEGATIVE Sensitive     * >=100,000 COLONIES/mL ESCHERICHIA COLI       Today   Subjective:   Karen [name] today has no headache,no chest abdominal pain,no new weakness tingling or numbness, feels much better wants to go home today.  Objective:   Blood pressure (!) 134/55, pulse 61, temperature 97.6 F (36.4 C), temperature source Oral, resp. rate 20, height 5' 3" (1.6 m), weight 99.5 kg, SpO2 96 %.  No intake or output data in the 24 hours ending 07/04/18 1445  Exam Awake Alert, Oriented x 3, No new F.N deficits, Normal affect [place].AT,PERRAL Supple Neck,No JVD, No cervical lymphadenopathy appriciated.  Symmetrical Chest wall movement, Good air movement bilaterally, CTAB RRR,No Gallops,Rubs or new Murmurs, No Parasternal Heave +ve B.Sounds, Abd Soft, Non tender, No organomegaly appriciated, No rebound -guarding or rigidity. No Cyanosis, Clubbing or edema, No new Rash or bruise  Data Review   CBC w Diff:  Lab Results  Component Value Date   WBC 10.5 06/28/2018   HGB 11.8 (L) 06/28/2018   HGB 10.8 (L) 04/04/2014   HCT 39.6 06/28/2018   HCT 33.6 (L) 04/04/2014   PLT 265 06/28/2018   PLT 160 04/04/2014   LYMPHOPCT 11 06/28/2018   LYMPHOPCT 7.3 04/04/2014   MONOPCT 8 06/28/2018   MONOPCT 7.5 04/04/2014    EOSPCT 1 06/28/2018   EOSPCT 1.2 04/04/2014   BASOPCT 1 06/28/2018   BASOPCT 0.3 04/04/2014    CMP:  Lab Results  Component Value Date   NA 136 06/28/2018   NA 136 06/18/2014   K 3.9 06/28/2018   K 4.5 06/18/2014   CL 89 (L) 06/28/2018   CL 96 (L) 06/18/2014   CO2 38 (H) 06/28/2018   CO2 32 06/18/2014  BUN 20 06/28/2018   BUN 22 (H) 06/18/2014   CREATININE 0.64 06/28/2018   CREATININE 0.78 06/18/2014   PROT 6.6 06/25/2018   PROT 6.3 (L) 04/02/2014   ALBUMIN 3.5 06/25/2018   ALBUMIN 3.0 (L) 04/02/2014   BILITOT 0.4 06/25/2018   BILITOT 1.0 04/02/2014   ALKPHOS 68 06/25/2018   ALKPHOS 77 04/02/2014   AST 22 06/25/2018   AST 49 (H) 04/02/2014   ALT 13 06/25/2018   ALT 20 04/02/2014  .   Total Time in preparing paper work, data evaluation and todays exam - 35 minutes  Epifanio Lesches M.D on 06/30/2018 at 2:45 PM    Note: This dictation was prepared with Dragon dictation along with smaller phrase technology. Any transcriptional errors that result from this process are unintentional.

## 2018-07-04 NOTE — Progress Notes (Deleted)
{Choose 1 Note Type (Telehealth Visit or Telephone Visit):[phone]}   I connected with  Karen [name] on 07/04/18 by a video enabled telemedicine application and verified that I am speaking with the correct person using two identifiers. I discussed the limitations of evaluation and management by telemedicine. The patient expressed understanding and agreed to proceed.   Evaluation Performed:  Follow-up visit  Date:  07/04/2018   ID:  Karen [name], [name] [birth year], MRN [id number]  Patient Location:  [address] [name] [address]   Provider location:   [facility], [place] office  PCP:  Ricardo Jericho, NP  Cardiologist:  Patsy Baltimore   Chief Complaint:      History of Present Illness:    Karen [name] is a 73 y.o. female who presents via audio/video conferencing for a telehealth visit today.   The patient does not symptoms concerning for COVID-19 infection (fever, chills, cough, or new SHORTNESS OF BREATH).   Patient has a past medical history of  morbid obesity, COPD, long smoking history, chronic diastolic CHF, pulmonary hypertension, chronic kidney disease presenting to the hospital with worsening lower extremity edema, shortness of breath She presents to establish care in the [facility] office after recent hospital admission for acute on chronic diastolic CHF  In the hospital June 26, 2018 Hospital records reviewed with the patient in detail   She reports lower extremity edema getting worse over the past week or more Worsening shortness of breath over the past day or 2 Symptoms got severe, presented to the hospital via urgent care Saturations 88% noted In the emergency room given IV Lasix BNP 150  Reports that she walks with a walker, sedentary lifestyle Poor diet, water main beverage As detailed below, poor diet, eats eggs, bacon, ribs Unable to walk very far secondary to deconditioning and shortness of breath  On  examination : Morbidly obese alert oriented, unable to estimate JVD, lungs scattered wheezes, Rales at the bases, heart sounds regular normal S1-S2 no murmurs appreciated, abdomen soft nontender  Musculoskeletal exam with good range of motion, neurologic exam grossly nonfocal, trace lower extremity edema bilaterally  Lab work reviewed showing creatinine 0.79 BUN 17 sodium 142 potassium 4.0- troponin hematocrit 37  EKG personally reviewed by myself showing normal sinus rhythm rate 62 bpm no significant ST or T wave changes    Prior CV studies:   The following studies were reviewed today:    Past Medical History:  Diagnosis Date  . (HFpEF) heart failure with preserved ejection fraction ([place]) 2017   (1) TTE 2017 a. EF 50% b. mild LVH c. mild MR/TR, d. mild LAE e. mild pulmonary HTN  (2) TTE 06/2018 a. EF 50-55%, mild AS with thickening of valve   . Acute on chronic respiratory failure with hypoxia and hypercapnia ([place]) 01/07/2015  . Anemia   . 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 Asthma   . Cataract   . Chronic kidney disease 08/10/2017  . CKD (chronic kidney disease)   . COPD (chronic obstructive pulmonary disease) ([place])    (1) 06/2018 tobacco use, home 3L oxygen   . Diabetes mellitus without complication (HCC)    (1) A1C 7.7 (06/2018)  . Edema, peripheral 04/20/2014  . History of kidney stones   . Hyperlipidemia   . Hypertension   . Iron deficiency anemia 06/22/2014  . Leucocytosis 10/19/2015  . Overactive bladder   . Primary osteoarthritis of right knee 09/01/2016  . Renal insufficiency   . Sciatica 01/07/2015  Past Surgical History:  Procedure Laterality Date  . APPENDECTOMY    . CESAREAN SECTION     x3  . CHOLECYSTECTOMY    . COLONOSCOPY WITH PROPOFOL N/A 08/28/2017   Procedure: COLONOSCOPY WITH PROPOFOL;  Surgeon: Lucilla Lame, MD;  Location: [facility] ENDOSCOPY;  Service: Endoscopy;  Laterality: N/A;  . COLONOSCOPY WITH PROPOFOL N/A 08/29/2017   Procedure: COLONOSCOPY WITH PROPOFOL;  Surgeon:  Lucilla Lame, MD;  Location: [facility] ENDOSCOPY;  Service: Endoscopy;  Laterality: N/A;  . CYSTOSCOPY W/ URETERAL STENT PLACEMENT Right 09/15/2017   Procedure: CYSTOSCOPY WITH RETROGRADE PYELOGRAM/URETERAL STENT PLACEMENT;  Surgeon: Cleon Gustin, MD;  Location: [facility] ORS;  Service: Urology;  Laterality: Right;  . CYSTOSCOPY/URETEROSCOPY/HOLMIUM LASER/STENT PLACEMENT Right 10/09/2017   Procedure: CYSTOSCOPY/URETEROSCOPY/HOLMIUM LASER/STENT PLACEMENT;  Surgeon: Abbie Sons, MD;  Location: [facility] ORS;  Service: Urology;  Laterality: Right;  right Stent exchange  . EYE SURGERY       No outpatient medications have been marked as taking for the 07/05/18 encounter (Appointment) with [name], MD.     Allergies:   Ace inhibitors; Gabapentin; Lisinopril; Lyrica [pregabalin]; and Shrimp [shellfish allergy]   Social History   Tobacco Use  . Smoking status: Former Smoker    Last attempt to quit: 12/04/1992    Years since quitting: 25.5  . Smokeless tobacco: Never Used  Substance Use Topics  . Alcohol use: No  . Drug use: No     Current Outpatient Medications on File Prior to Visit  Medication Sig Dispense Refill  . aspirin 81 MG chewable tablet Chew 81 mg by mouth daily. On hold    . atorvastatin (LIPITOR) 10 MG tablet Take 1 tablet by mouth daily.    . bisoprolol (ZEBETA) 5 MG tablet Take 1 tablet (5 mg total) by mouth daily. 30 tablet 0  . canagliflozin (INVOKANA) 100 MG TABS tablet Take 1 tablet by mouth daily.    Marland Kitchen estradiol (ESTRACE VAGINAL) 0.1 MG/GM vaginal cream Apply 0.52m (pea-sized amount)  just inside the vaginal introitus with a finger-tip on Monday, Wednesday and Friday nights. (Patient taking differently: daily. .Marland Kitchen 30 g 12  . Ferrous Sulfate (IRON) 325 (65 Fe) MG TABS Take 1 tablet by mouth daily.  11  . fluticasone furoate-vilanterol (BREO ELLIPTA) 100-25 MCG/INH AEPB Inhale 1 puff into the lungs daily.    . Fluticasone-Umeclidin-Vilant 100-62.5-25 MCG/INH AEPB Inhale 1  puff into the lungs daily.    . furosemide (LASIX) 40 MG tablet Take 1 tablet (40 mg total) by mouth daily. 30 tablet 0  . HUMALOG 100 UNIT/ML injection Inject 76 Units into the skin daily. In VGO device  5  . Insulin Disposable Pump (V-GO 40) KIT Inject into the skin as directed.  5  . Insulin Human (INSULIN PUMP) SOLN Inject 1 each into the skin 3 times daily with meals, bedtime and 2 AM. (Patient taking differently: Inject 1 each into the skin 3 times daily with meals, bedtime and 2 AM. Pt uses Novolog)    . losartan (COZAAR) 100 MG tablet Take 100 mg by mouth daily.  11  . metFORMIN (GLUCOPHAGE) 500 MG tablet Take 1,000 mg by mouth 2 (two) times daily with a meal.     . montelukast (SINGULAIR) 10 MG tablet Take 10 mg by mouth at bedtime.    . OXYGEN Place 3 L/min into the nose.     . solifenacin (VESICARE) 10 MG tablet Take 1 tablet (10 mg total) by mouth daily. 30 tablet 5  .  vitamin B-12 (CYANOCOBALAMIN) 500 MCG tablet Take 500 mcg by mouth daily.     No current facility-administered medications on file prior to visit.      Family Hx: The patient's family history includes Other in her father and mother.  ROS:   Please see the history of present illness.    ROS    Labs/Other Tests and Data Reviewed:    Recent Labs: 06/25/2018: ALT 13; B Natriuretic Peptide 150.0 06/26/2018: Magnesium 2.0 06/28/2018: BUN 20; Creatinine, Ser 0.64; Hemoglobin 11.8; Platelets 265; Potassium 3.9; Sodium 136   Recent Lipid Panel No results found for: CHOL, TRIG, HDL, CHOLHDL, LDLCALC, LDLDIRECT  Wt Readings from Last 3 Encounters:  07/04/18 216 lb (98 kg)  06/30/18 219 lb 4.8 oz (99.5 kg)  06/25/18 224 lb (101.6 kg)     Exam:    Vital Signs: Vital signs may also be detailed in the HPI There were no vitals taken for this visit.  Wt Readings from Last 3 Encounters:  07/04/18 216 lb (98 kg)  06/30/18 219 lb 4.8 oz (99.5 kg)  06/25/18 224 lb (101.6 kg)   Temp Readings from Last 3 Encounters:   06/30/18 97.6 F (36.4 C) (Oral)  06/25/18 97.6 F (36.4 C) (Oral)  05/19/18 98 F (36.7 C) (Oral)   BP Readings from Last 3 Encounters:  06/30/18 (!) 134/55  06/25/18 134/78  05/19/18 (!) 145/74   Pulse Readings from Last 3 Encounters:  06/30/18 61  06/25/18 86  05/19/18 78     Well nourished, well developed female in no acute distress. Constitutional:  oriented to person, place, and time. No distress.  Head: Normocephalic and atraumatic.  Eyes:  no discharge. No scleral icterus.  Neck: Normal range of motion. Neck supple.  Pulmonary/Chest: No audible wheezing, no distress, appears comfortable Musculoskeletal: Normal range of motion.  no  tenderness or deformity.  Neurological:   Coordination normal. Full exam not performed Skin:  No rash Psychiatric:  normal mood and affect. behavior is normal. Thought content normal.    ASSESSMENT & PLAN:    No diagnosis found.   Acute on chronic respiratory failure with hypoxia  Acute on chronic diastolic CHF Morbid obesity likely contributing to her symptoms Prior extensive smoking history, underlying COPD  ----Close to 8 L negative Appears to be euvolemic --- We have had some CHF education, discussions during her hospital stay Poor diet at baseline, eats and drinks whatever she wants  -Outpatient CHF clinic --- Recommend Lasix 40 daily with at least 10 potassium daily Continue losartan, low-dose beta-blocker  Essential hypertension Improved after diuresis Would continue Lasix 40 daily, losartan 100 daily  bisoprolol 5 mg daily  Diabetes type 2, poorly controlled On insulin, metformin Hemoglobin A1c 7.7 Needs nutrition consult for weight loss Strongly recommended some lifestyle modification  COPD On home oxygen Long smoking history Needs outpatient follow-up with pulmonary -She would likely benefit from lung works, cardiac rehab Would benefit from weight loss  Nausea vomiting diarrhea Possible GI etiology,  gastroenteritis Would follow bland diet   COVID-19 Education: The signs and symptoms of COVID-19 were discussed with the patient and how to seek care for testing (follow up with PCP or arrange E-visit).  The importance of social distancing was discussed today.  Patient Risk:   After full review of this patients clinical status, I feel that they are at least moderate risk at this time.  Time:   Today, I have spent 25 minutes with the patient with telehealth technology discussing  the cardiac and medical problems/diagnoses detailed above   10 min spent reviewing the chart prior to patient visit today   Medication Adjustments/Labs and Tests Ordered: Current medicines are reviewed at length with the patient today.  Concerns regarding medicines are outlined above.   Tests Ordered: No tests ordered   Medication Changes: No changes made   Disposition: Follow-up in 6 months   Signed, Ida Rogue, MD  07/04/2018 1:21 PM    [place] Office [address] [place] [address], [place], [place] [address]

## 2018-07-04 NOTE — Patient Instructions (Signed)
Continue weighing daily and call for an overnight weight gain of > 2 pounds or a weekly weight gain of >5 pounds. 

## 2018-07-05 ENCOUNTER — Telehealth: Payer: Medicare Other | Admitting: Cardiovascular Disease

## 2018-07-05 ENCOUNTER — Telehealth: Payer: Self-pay

## 2018-07-05 ENCOUNTER — Other Ambulatory Visit: Payer: Self-pay

## 2018-07-05 NOTE — Telephone Encounter (Signed)
Called patient.  No answer. VM was full.  I have tried to contact patient 3 times with no answer or call back.  Will cancel patients appointment

## 2018-07-08 ENCOUNTER — Telehealth: Payer: Self-pay | Admitting: Family

## 2018-07-08 NOTE — Telephone Encounter (Signed)
Patient called to say that she's gained 7 pounds over the last 8 days. Says that when she was discharged from the hospital on 06/30/2018, she weighed 215 pounds and today she weighs 222 pounds. She denies any shortness of breath or swelling in her legs/ abdomen and doesn't think she's eaten any salty foods. She feels like her baseline weight is around 222 pounds.   Advised patient to double her furosemide for today and tomorrow and call me back in 2 days to update me on her weights. Patient was comfortable with this plan.

## 2018-07-10 ENCOUNTER — Telehealth: Payer: Self-pay | Admitting: Family

## 2018-07-10 NOTE — Telephone Encounter (Signed)
Patient called to update on her weight. She had doubled her furosemide for the last 2 days due to weight gain. She says that her weight yesterday was 222 pounds and today she weighs 220 pounds. Denies any shortness of breath/ edema and says that she feels better and is walking better now. Thanked her for the update and instructed her to call back if this occurs again.

## 2018-07-11 ENCOUNTER — Telehealth (HOSPITAL_COMMUNITY): Payer: Self-pay

## 2018-07-11 NOTE — Telephone Encounter (Signed)
Have called several times with no answer, unable to leave message due to voice mail box is full.  Will continue to try in next couple of days.   [place] [phone]

## 2018-07-18 ENCOUNTER — Other Ambulatory Visit (HOSPITAL_COMMUNITY): Payer: Self-pay

## 2018-07-18 ENCOUNTER — Telehealth: Payer: Medicare Other | Admitting: Family

## 2018-07-18 NOTE — Progress Notes (Signed)
Today went out to try to meet [place] at home.  She has not been answering her phone calls and missed an appt with Darylene Price today.  When arrived husband was outside, he states she was inside sleeping.  She sleeps everyday from 1-4.  He did not want to wake her.  He states she is doing good.  They both have PCP appts next week.  Advised him she missed appt today, he did not know about it. Gave him a packet with info about the program and he asked if I could come back Monday to visit.  Will visit with her Monday around 12 noon.  Will discuss heart failure, weights, fluids and diet.   [place] [phone]

## 2018-07-22 ENCOUNTER — Encounter (HOSPITAL_COMMUNITY): Payer: Self-pay

## 2018-07-22 ENCOUNTER — Other Ambulatory Visit (HOSPITAL_COMMUNITY): Payer: Self-pay

## 2018-07-22 NOTE — Progress Notes (Signed)
Today was first visit with Karen [name].  She appears to be doing well.  She states weight is up 2 lbs today but she has no swelling and no shortness of breath.  She states she had no change in her routine, she elevates her feet and trying to eat healthy but gets confused of what to eat.  Left her how to read a label and discussed some low sodium foods.  Will check to see if I can get her [employer] meals.   She has all her medications and they are affordable.  Medications verified.  She has a PCP appt tomorrow.  Discussed with her rescheduling appt with Heart Failure clinic, she states maybe later. Discussed the program with her and she is willing to be part of it.  Discussed covid 19 with her and safety measures.  Advised her to contact me when weight gain of 3 lbs overnight or 5 lbs in a week. Will visit for heart failure and diet.   [place] [phone]

## 2018-07-23 ENCOUNTER — Other Ambulatory Visit: Payer: Self-pay

## 2018-07-23 MED ORDER — SOLIFENACIN SUCCINATE 10 MG PO TABS: 10.0000 mg | ORAL_TABLET | Freq: Every day | ORAL | 5 refills | Status: DC

## 2018-07-24 ENCOUNTER — Telehealth (HOSPITAL_COMMUNITY): Payer: Self-pay | Admitting: Licensed Clinical Social Worker

## 2018-07-24 NOTE — Telephone Encounter (Signed)
Patient identified as a candidate to receive 14 heart healthy meals per week for 3 months through [facility] partnership with [employer] program.   Completed referral sent in for review.  Anticipate patient will receive first shipment of food in 1-3 business days.  Johanan Skorupski H. Kam Rahimi, LCSW Clinical Social Worker [facility] Desk#: [phone] Cell#: [phone]   

## 2018-07-30 ENCOUNTER — Telehealth (HOSPITAL_COMMUNITY): Payer: Self-pay

## 2018-07-30 ENCOUNTER — Other Ambulatory Visit: Payer: Self-pay | Admitting: Family

## 2018-07-30 MED ORDER — BISOPROLOL FUMARATE 5 MG PO TABS: 5.0000 mg | ORAL_TABLET | Freq: Every day | ORAL | 3 refills | Status: DC

## 2018-07-30 NOTE — Telephone Encounter (Signed)
Attempted to contact, no answer and voice mail box is full.  Will continue to try to contact.  [place] [phone]

## 2018-07-31 ENCOUNTER — Telehealth (HOSPITAL_COMMUNITY): Payer: Self-pay

## 2018-07-31 NOTE — Telephone Encounter (Signed)
Visit was by phone today.  She states she has received the [employer]oms meals and liking them.  She states has helped.  She states her PCP appt went well last week.  She has all her medications and verified.  She states she is feeling good.  No swelling.  Denies any pain including chest pain, shortness of breath at rest, dizziness or headaches.  Will continue to visit for heart failure.   [place] [phone]

## 2018-08-02 ENCOUNTER — Ambulatory Visit (INDEPENDENT_AMBULATORY_CARE_PROVIDER_SITE_OTHER)
Admission: EM | Admit: 2018-08-02 | Discharge: 2018-08-02 | Disposition: A | Discharge status: Home / Self Care | Payer: Medicare Other | Source: Home / Self Care | Attending: Family Medicine | Admitting: Family Medicine

## 2018-08-02 ENCOUNTER — Encounter: Payer: Self-pay | Admitting: Emergency Medicine

## 2018-08-02 ENCOUNTER — Other Ambulatory Visit: Payer: Self-pay

## 2018-08-02 DIAGNOSIS — S29012A Strain of muscle and tendon of back wall of thorax, initial encounter: Secondary | ICD-10-CM

## 2018-08-02 MED ORDER — CYCLOBENZAPRINE HCL 10 MG PO TABS: 10.0000 mg | ORAL_TABLET | Freq: Three times a day (TID) | ORAL | 0 refills | Status: DC | PRN

## 2018-08-02 NOTE — Discharge Instructions (Signed)
Heat to area, stretching, over the counter analgesics/anti-inflammatories as needed

## 2018-08-02 NOTE — ED Provider Notes (Signed)
[facility]    CSN: [id number] Arrival date & time: 08/02/18  1033     History   Chief Complaint Chief Complaint  Patient presents with   Neck Pain   Shoulder Pain    left    HPI Karen [name] is a 73 y.o. female.   74 yo female with a c/o left sided neck and shoulder pain for the past week. Denies any injuries, rash, fevers, chills. States she fell over a week ago but she did not injure that area or her back when she fell.   The history is provided by the patient.  Neck Pain  Shoulder Pain  Associated symptoms: neck pain     Past Medical History:  Diagnosis Date   (HFpEF) heart failure with preserved ejection fraction ([place]) 2017   (1) TTE 2017 a. EF 50% b. mild LVH c. mild MR/TR, d. mild LAE e. mild pulmonary HTN  (2) TTE 06/2018 a. EF 50-55%, mild AS with thickening of valve    Acute on chronic respiratory failure with hypoxia and hypercapnia (HCC) 01/07/2015   Anemia    Asterixis 01/07/2015   Asthma    Cataract    Chronic kidney disease 08/10/2017   CKD (chronic kidney disease)    COPD (chronic obstructive pulmonary disease) ([place])    (1) 06/2018 tobacco use, home 3L oxygen    Diabetes mellitus without complication (HCC)    (1) A1C 7.7 (06/2018)   Edema, peripheral 04/20/2014   History of kidney stones    Hyperlipidemia    Hypertension    Iron deficiency anemia 06/22/2014   Leucocytosis 10/19/2015   Overactive bladder    Primary osteoarthritis of right knee 09/01/2016   Renal insufficiency    Sciatica 01/07/2015    Patient Active Problem List   Diagnosis Date Noted   Acute on chronic respiratory failure ([place]) 06/25/2018   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 diabetes mellitus ([place]) 01/25/2018   History of non anemic vitamin B12 deficiency 01/25/2018   Personal history of kidney stones 11/12/2017   Urge incontinence 11/12/2017   Right ureteral stone 09/15/2017   Acute GI bleeding    GI bleed 08/26/2017    Arthritis 08/10/2017   Chronic kidney disease 08/10/2017   COPD (chronic obstructive pulmonary disease) ([place]) 08/10/2017   Diabetes mellitus type 2, uncomplicated ([place]) 55/37/4827   Hypertension 08/10/2017   Obesity (BMI 35.0-39.9 without comorbidity) 04/11/2017   Primary osteoarthritis of right knee 09/01/2016   Leucocytosis 10/19/2015   Asterixis 01/07/2015   Acute on chronic respiratory failure with hypoxia and hypercapnia (HCC) 01/07/2015   Sciatica 01/07/2015   Weakness 01/07/2015   Chronic 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 01/04/2015   Iron deficiency anemia 06/22/2014   Microalbuminuria 06/22/2014   CHF (congestive heart failure) ([place]) 04/20/2014   Edema, peripheral 04/20/2014    Past Surgical History:  Procedure Laterality Date   APPENDECTOMY     CESAREAN SECTION     x3   CHOLECYSTECTOMY     COLONOSCOPY WITH PROPOFOL N/A 08/28/2017   Procedure: COLONOSCOPY WITH PROPOFOL;  Surgeon: Lucilla Lame, MD;  Location: [facility] ENDOSCOPY;  Service: Endoscopy;  Laterality: N/A;   COLONOSCOPY WITH PROPOFOL N/A 08/29/2017   Procedure: COLONOSCOPY WITH PROPOFOL;  Surgeon: Lucilla Lame, MD;  Location: [facility] ENDOSCOPY;  Service: Endoscopy;  Laterality: N/A;   CYSTOSCOPY W/ URETERAL STENT PLACEMENT Right 09/15/2017   Procedure: CYSTOSCOPY WITH RETROGRADE PYELOGRAM/URETERAL STENT PLACEMENT;  Surgeon: Cleon Gustin, MD;  Location: [facility] ORS;  Service: Urology;  Laterality: Right;   CYSTOSCOPY/URETEROSCOPY/HOLMIUM LASER/STENT PLACEMENT Right 10/09/2017   Procedure: CYSTOSCOPY/URETEROSCOPY/HOLMIUM LASER/STENT PLACEMENT;  Surgeon: Abbie Sons, MD;  Location: [facility] ORS;  Service: Urology;  Laterality: Right;  right Stent exchange   EYE SURGERY      OB History   No obstetric history on file.      Home Medications    Prior to Admission medications   Medication Sig Start Date End Date Taking? Authorizing Provider  aspirin 81 MG chewable tablet Chew  81 mg by mouth daily. On hold   Yes [provider]  atorvastatin (LIPITOR) 10 MG tablet Take 1 tablet by mouth daily.   Yes [provider]  bisoprolol (ZEBETA) 5 MG tablet Take 1 tablet (5 mg total) by mouth daily. 07/30/18  Yes Hackney, Tina A, FNP  canagliflozin (INVOKANA) 100 MG TABS tablet Take 1 tablet by mouth daily.   Yes [provider]  estradiol (ESTRACE VAGINAL) 0.1 MG/GM vaginal cream Apply 0.74m (pea-sized amount)  just inside the vaginal introitus with a finger-tip on Monday, Wednesday and Friday nights. Patient taking differently: daily. . 08/31/17  Yes McGowan, SHunt Oris PA-C  Ferrous Sulfate (IRON) 325 (65 Fe) MG TABS Take 1 tablet by mouth daily. 09/14/17  Yes [provider]  fluticasone furoate-vilanterol (BREO ELLIPTA) 100-25 MCG/INH AEPB Inhale 1 puff into the lungs daily.   Yes [provider]  Fluticasone-Umeclidin-Vilant 100-62.5-25 MCG/INH AEPB Inhale 1 puff into the lungs daily. 12/25/17  Yes [provider]  furosemide (LASIX) 40 MG tablet Take 1 tablet (40 mg total) by mouth daily. 07/01/18  Yes KEpifanio Lesches MD  HUMALOG 100 UNIT/ML injection Inject 76 Units into the skin daily. In VGO device 08/19/17  Yes [provider]  Insulin Disposable Pump (V-GO 40) KIT Inject into the skin as directed. 08/14/17  Yes [provider]  Insulin Human (INSULIN PUMP) SOLN Inject 1 each into the skin 3 times daily with meals, bedtime and 2 AM. Patient taking differently: Inject 1 each into the skin 3 times daily with meals, bedtime and 2 AM. Pt uses Novolog 09/18/17  Yes PDustin Flock MD  losartan (COZAAR) 100 MG tablet Take 100 mg by mouth daily. 09/12/17  Yes [provider]  metFORMIN (GLUCOPHAGE) 500 MG tablet Take 1,000 mg by mouth 2 (two) times daily with a meal.    Yes [provider]  montelukast (SINGULAIR) 10 MG tablet Take 10 mg by mouth at bedtime.   Yes [provider]    OXYGEN Place 3 L/min into the nose.    Yes [provider]  solifenacin (VESICARE) 10 MG tablet Take 1 tablet (10 mg total) by mouth daily. 07/23/18  Yes Stoioff, SRonda Fairly MD  vitamin B-12 (CYANOCOBALAMIN) 500 MCG tablet Take 500 mcg by mouth daily.   Yes [provider]  cyclobenzaprine (FLEXERIL) 10 MG tablet Take 1 tablet (10 mg total) by mouth 3 (three) times daily as needed for muscle spasms. 08/02/18   CNorval Gable MD    Family History Family History  Problem Relation Age of Onset   Other Mother        unknown medical history   Other Father        unknown medical history    Social History Social History   Tobacco Use   Smoking status: Former Smoker    Last attempt to quit: 12/04/1992    Years since quitting: 25.6   Smokeless tobacco: Never Used  Substance Use  Topics   Alcohol use: No   Drug use: No     Allergies   Ace inhibitors; Gabapentin; Lisinopril; Lyrica [pregabalin]; and Shrimp [shellfish allergy]   Review of Systems Review of Systems  Musculoskeletal: Positive for neck pain.     Physical Exam Triage Vital Signs ED Triage Vitals  Enc Vitals Group     BP 08/02/18 1137 103/63     Pulse Rate 08/02/18 1137 (!) 52     Resp 08/02/18 1137 17     Temp 08/02/18 1137 97.8 F (36.6 C)     Temp Source 08/02/18 1137 Oral     SpO2 08/02/18 1137 99 %     Weight 08/02/18 1133 222 lb (100.7 kg)     Height 08/02/18 1133 '5\' 3"'  (1.6 m)     Head Circumference --      Peak Flow --      Pain Score 08/02/18 1133 10     Pain Loc --      Pain Edu? --      Excl. in [place]? --    No data found.  Updated Vital Signs BP 103/63 (BP Location: Right Arm)    Pulse (!) 52    Temp 97.8 F (36.6 C) (Oral)    Resp 17    Ht '5\' 3"'  (1.6 m)    Wt 100.7 kg    SpO2 99%    BMI 39.33 kg/m   Visual Acuity Right Eye Distance:   Left Eye Distance:   Bilateral Distance:    Right Eye Near:   Left Eye Near:    Bilateral Near:     Physical Exam Vitals signs  and nursing note reviewed.  Constitutional:      General: She is not in acute distress.    Appearance: She is not toxic-appearing or diaphoretic.  Musculoskeletal:     Cervical back: She exhibits tenderness (over the left trapezius muscle) and spasm. She exhibits normal range of motion, no bony tenderness, no swelling, no edema, no deformity, no laceration and normal pulse.  Neurological:     Mental Status: She is alert.      UC Treatments / Results  Labs (all labs ordered are listed, but only abnormal results are displayed) Labs Reviewed - No data to display  EKG None  Radiology No results found.  Procedures Procedures (including critical care time)  Medications Ordered in UC Medications - No data to display  Initial Impression / Assessment and Plan / UC Course  I have reviewed the triage vital signs and the nursing notes.  Pertinent labs & imaging results that were available during my care of the patient were reviewed by me and considered in my medical decision making (see chart for details).      Final Clinical Impressions(s) / UC Diagnoses   Final diagnoses:  Upper back strain, initial encounter     Discharge Instructions     Heat to area, stretching, over the counter analgesics/anti-inflammatories as needed    ED Prescriptions    Medication Sig Dispense Auth. Provider   cyclobenzaprine (FLEXERIL) 10 MG tablet Take 1 tablet (10 mg total) by mouth 3 (three) times daily as needed for muscle spasms. 30 tablet Norval Gable, MD      1. diagnosis reviewed with patient 2. rx as per orders above; reviewed possible side effects, interactions, risks and benefits  3. Recommend supportive treatment as above 4. Follow-up prn if symptoms worsen or don't improve   Controlled Substance Prescriptions [place] Controlled  Substance Registry consulted? Not Applicable   Norval Gable, MD 08/02/18 [phone]

## 2018-08-02 NOTE — ED Triage Notes (Signed)
Patient c/o left sided neck and shoulder pain that started a week ago.  Patient denies recent injury.  Patient states that she did fall over a week ago.

## 2018-08-05 ENCOUNTER — Emergency Department (HOSPITAL_COMMUNITY)
Admit: 2018-08-05 | Discharge: 2018-08-05 | Disposition: A | Discharge status: Home / Self Care | Payer: Medicare Other | Attending: Physician Assistant | Admitting: Physician Assistant

## 2018-08-05 ENCOUNTER — Emergency Department: Payer: Medicare Other

## 2018-08-05 ENCOUNTER — Other Ambulatory Visit: Payer: Self-pay

## 2018-08-05 ENCOUNTER — Inpatient Hospital Stay
Admission: EM | Admit: 2018-08-05 | Discharge: 2018-08-10 | DRG: 291 | Disposition: A | Discharge status: Home Health | Payer: Medicare Other | Attending: Internal Medicine | Admitting: Internal Medicine

## 2018-08-05 DIAGNOSIS — N179 Acute kidney failure, unspecified: Secondary | ICD-10-CM | POA: Diagnosis not present

## 2018-08-05 DIAGNOSIS — I130 Hypertensive heart and chronic kidney disease with heart failure and stage 1 through stage 4 chronic kidney disease, or unspecified chronic kidney disease: Principal | ICD-10-CM | POA: Diagnosis present

## 2018-08-05 DIAGNOSIS — I5033 Acute on chronic diastolic (congestive) heart failure: Secondary | ICD-10-CM

## 2018-08-05 DIAGNOSIS — Z9981 Dependence on supplemental oxygen: Secondary | ICD-10-CM | POA: Diagnosis not present

## 2018-08-05 DIAGNOSIS — Z9641 Presence of insulin pump (external) (internal): Secondary | ICD-10-CM | POA: Diagnosis present

## 2018-08-05 DIAGNOSIS — Z7951 Long term (current) use of inhaled steroids: Secondary | ICD-10-CM

## 2018-08-05 DIAGNOSIS — I959 Hypotension, unspecified: Secondary | ICD-10-CM | POA: Diagnosis present

## 2018-08-05 DIAGNOSIS — Z87891 Personal history of nicotine dependence: Secondary | ICD-10-CM | POA: Diagnosis not present

## 2018-08-05 DIAGNOSIS — I361 Nonrheumatic tricuspid (valve) insufficiency: Secondary | ICD-10-CM | POA: Diagnosis not present

## 2018-08-05 DIAGNOSIS — I5043 Acute on chronic combined systolic (congestive) and diastolic (congestive) heart failure: Secondary | ICD-10-CM | POA: Diagnosis present

## 2018-08-05 DIAGNOSIS — R0602 Shortness of breath: Secondary | ICD-10-CM | POA: Diagnosis not present

## 2018-08-05 DIAGNOSIS — Z91013 Allergy to seafood: Secondary | ICD-10-CM

## 2018-08-05 DIAGNOSIS — M1711 Unilateral primary osteoarthritis, right knee: Secondary | ICD-10-CM | POA: Diagnosis present

## 2018-08-05 DIAGNOSIS — N183 Chronic kidney disease, stage 3 (moderate): Secondary | ICD-10-CM | POA: Diagnosis present

## 2018-08-05 DIAGNOSIS — N170 Acute kidney failure with tubular necrosis: Secondary | ICD-10-CM | POA: Diagnosis present

## 2018-08-05 DIAGNOSIS — I272 Pulmonary hypertension, unspecified: Secondary | ICD-10-CM | POA: Diagnosis present

## 2018-08-05 DIAGNOSIS — Z888 Allergy status to other drugs, medicaments and biological substances status: Secondary | ICD-10-CM

## 2018-08-05 DIAGNOSIS — Z7982 Long term (current) use of aspirin: Secondary | ICD-10-CM

## 2018-08-05 DIAGNOSIS — J9621 Acute and chronic respiratory failure with hypoxia: Secondary | ICD-10-CM | POA: Diagnosis present

## 2018-08-05 DIAGNOSIS — Z20828 Contact with and (suspected) exposure to other viral communicable diseases: Secondary | ICD-10-CM | POA: Diagnosis present

## 2018-08-05 DIAGNOSIS — E875 Hyperkalemia: Secondary | ICD-10-CM | POA: Diagnosis present

## 2018-08-05 DIAGNOSIS — J449 Chronic obstructive pulmonary disease, unspecified: Secondary | ICD-10-CM | POA: Diagnosis present

## 2018-08-05 DIAGNOSIS — D631 Anemia in chronic kidney disease: Secondary | ICD-10-CM | POA: Diagnosis present

## 2018-08-05 DIAGNOSIS — Z794 Long term (current) use of insulin: Secondary | ICD-10-CM | POA: Diagnosis not present

## 2018-08-05 DIAGNOSIS — E785 Hyperlipidemia, unspecified: Secondary | ICD-10-CM | POA: Diagnosis present

## 2018-08-05 DIAGNOSIS — E039 Hypothyroidism, unspecified: Secondary | ICD-10-CM | POA: Diagnosis present

## 2018-08-05 DIAGNOSIS — R001 Bradycardia, unspecified: Secondary | ICD-10-CM | POA: Diagnosis present

## 2018-08-05 DIAGNOSIS — E1122 Type 2 diabetes mellitus with diabetic chronic kidney disease: Secondary | ICD-10-CM | POA: Diagnosis present

## 2018-08-05 DIAGNOSIS — I340 Nonrheumatic mitral (valve) insufficiency: Secondary | ICD-10-CM | POA: Diagnosis not present

## 2018-08-05 DIAGNOSIS — Z6839 Body mass index (BMI) 39.0-39.9, adult: Secondary | ICD-10-CM

## 2018-08-05 HISTORY — DX: Bradycardia, unspecified: R00.1

## 2018-08-05 LAB — CBC WITH DIFFERENTIAL/PLATELET
Abs Immature Granulocytes: 0.06 10*3/uL (ref 0.00–0.07)
Basophils Absolute: 0.1 10*3/uL (ref 0.0–0.1)
Basophils Relative: 1 %
Eosinophils Absolute: 0.3 10*3/uL (ref 0.0–0.5)
Eosinophils Relative: 3 %
HCT: 37.4 % (ref 36.0–46.0)
Hemoglobin: 11.3 g/dL — ABNORMAL LOW (ref 12.0–15.0)
Immature Granulocytes: 1 %
Lymphocytes Relative: 16 %
Lymphs Abs: 1.9 10*3/uL (ref 0.7–4.0)
MCH: 27.4 pg (ref 26.0–34.0)
MCHC: 30.2 g/dL (ref 30.0–36.0)
MCV: 90.6 fL (ref 80.0–100.0)
Monocytes Absolute: 1 10*3/uL (ref 0.1–1.0)
Monocytes Relative: 9 %
Neutro Abs: 8.2 10*3/uL — ABNORMAL HIGH (ref 1.7–7.7)
Neutrophils Relative %: 70 %
Platelets: 285 10*3/uL (ref 150–400)
RBC: 4.13 MIL/uL (ref 3.87–5.11)
RDW: 16.6 % — ABNORMAL HIGH (ref 11.5–15.5)
WBC: 11.5 10*3/uL — ABNORMAL HIGH (ref 4.0–10.5)
nRBC: 0 % (ref 0.0–0.2)

## 2018-08-05 LAB — COMPREHENSIVE METABOLIC PANEL
ALT: 15 U/L (ref 0–44)
AST: 19 U/L (ref 15–41)
Albumin: 3.5 g/dL (ref 3.5–5.0)
Alkaline Phosphatase: 72 U/L (ref 38–126)
Anion gap: 13 (ref 5–15)
BUN: 54 mg/dL — ABNORMAL HIGH (ref 8–23)
CO2: 22 mmol/L (ref 22–32)
Calcium: 8.6 mg/dL — ABNORMAL LOW (ref 8.9–10.3)
Chloride: 98 mmol/L (ref 98–111)
Creatinine, Ser: 1.91 mg/dL — ABNORMAL HIGH (ref 0.44–1.00)
GFR calc Af Amer: 29 mL/min — ABNORMAL LOW (ref >60)
GFR calc non Af Amer: 25 mL/min — ABNORMAL LOW (ref >60)
Glucose, Bld: 138 mg/dL — ABNORMAL HIGH (ref 70–99)
Potassium: 5.1 mmol/L (ref 3.5–5.1)
Sodium: 133 mmol/L — ABNORMAL LOW (ref 135–145)
Total Bilirubin: 0.4 mg/dL (ref 0.3–1.2)
Total Protein: 6.6 g/dL (ref 6.5–8.1)

## 2018-08-05 LAB — ECHOCARDIOGRAM COMPLETE
Height: 63 in
Weight: 3616 oz

## 2018-08-05 LAB — SARS CORONAVIRUS 2 BY RT PCR (HOSPITAL ORDER, PERFORMED IN ~~LOC~~ HOSPITAL LAB): SARS Coronavirus 2: NEGATIVE

## 2018-08-05 LAB — MAGNESIUM: Magnesium: 2.4 mg/dL (ref 1.7–2.4)

## 2018-08-05 LAB — BRAIN NATRIURETIC PEPTIDE
B Natriuretic Peptide: 826 pg/mL — ABNORMAL HIGH (ref 0.0–100.0)
B Natriuretic Peptide: 831 pg/mL — ABNORMAL HIGH (ref 0.0–100.0)

## 2018-08-05 LAB — MRSA PCR SCREENING: MRSA by PCR: NEGATIVE

## 2018-08-05 LAB — TROPONIN I: Troponin I: <0.03 ng/mL (ref <0.03)

## 2018-08-05 LAB — GLUCOSE, CAPILLARY: Glucose-Capillary: 98 mg/dL (ref 70–99)

## 2018-08-05 LAB — TSH
TSH: 5.431 u[IU]/mL — ABNORMAL HIGH (ref 0.350–4.500)
TSH: 3.242 u[IU]/mL (ref 0.350–4.500)

## 2018-08-05 MED ORDER — ONDANSETRON HCL 4 MG/2ML IJ SOLN
4.0000 mg | Freq: Four times a day (QID) | INTRAMUSCULAR | Status: DC | PRN
  Administered 2018-08-05 – 2018-08-06 (×3): 4 mg via INTRAVENOUS
  Filled 2018-08-05 (×3): qty 2

## 2018-08-05 MED ORDER — DARIFENACIN HYDROBROMIDE ER 15 MG PO TB24
15.0000 mg | ORAL_TABLET | Freq: Every day | ORAL | Status: DC
  Filled 2018-08-05: qty 1

## 2018-08-05 MED ORDER — FERROUS SULFATE 325 (65 FE) MG PO TABS
325.0000 mg | ORAL_TABLET | Freq: Every day | ORAL | Status: DC
  Administered 2018-08-07 – 2018-08-10 (×4): 325 mg via ORAL
  Filled 2018-08-05 (×5): qty 1

## 2018-08-05 MED ORDER — FLUTICASONE FUROATE-VILANTEROL 100-25 MCG/INH IN AEPB
1.0000 | INHALATION_SPRAY | Freq: Every day | RESPIRATORY_TRACT | Status: DC
  Filled 2018-08-05: qty 28

## 2018-08-05 MED ORDER — ATORVASTATIN CALCIUM 10 MG PO TABS
10.0000 mg | ORAL_TABLET | Freq: Every day | ORAL | Status: DC
  Administered 2018-08-07 – 2018-08-10 (×4): 10 mg via ORAL
  Filled 2018-08-05 (×4): qty 1

## 2018-08-05 MED ORDER — ASPIRIN 81 MG PO CHEW
81.0000 mg | CHEWABLE_TABLET | Freq: Every day | ORAL | Status: DC
  Administered 2018-08-07 – 2018-08-10 (×4): 81 mg via ORAL
  Filled 2018-08-05 (×4): qty 1

## 2018-08-05 MED ORDER — VITAMIN B-12 100 MCG PO TABS
500.0000 ug | ORAL_TABLET | Freq: Every day | ORAL | Status: DC
  Administered 2018-08-05 – 2018-08-10 (×5): 500 ug via ORAL
  Filled 2018-08-05 (×5): qty 5

## 2018-08-05 MED ORDER — ACETAMINOPHEN 650 MG RE SUPP: 650.0000 mg | Freq: Four times a day (QID) | RECTAL | Status: DC | PRN

## 2018-08-05 MED ORDER — UMECLIDINIUM BROMIDE 62.5 MCG/INH IN AEPB
1.0000 | INHALATION_SPRAY | Freq: Every day | RESPIRATORY_TRACT | Status: DC
  Administered 2018-08-07 – 2018-08-10 (×4): 1 via RESPIRATORY_TRACT
  Filled 2018-08-05 (×3): qty 7

## 2018-08-05 MED ORDER — MONTELUKAST SODIUM 10 MG PO TABS
10.0000 mg | ORAL_TABLET | Freq: Every day | ORAL | Status: DC
  Administered 2018-08-05: 10 mg via ORAL
  Filled 2018-08-05: qty 1

## 2018-08-05 MED ORDER — METFORMIN HCL 500 MG PO TABS
1000.0000 mg | ORAL_TABLET | Freq: Two times a day (BID) | ORAL | Status: DC
  Administered 2018-08-05: 16:00:00 1000 mg via ORAL
  Filled 2018-08-05 (×2): qty 2

## 2018-08-05 MED ORDER — UMECLIDINIUM BROMIDE 62.5 MCG/INH IN AEPB
1.0000 | INHALATION_SPRAY | Freq: Every day | RESPIRATORY_TRACT | Status: DC
  Filled 2018-08-05: qty 7

## 2018-08-05 MED ORDER — ONDANSETRON HCL 4 MG PO TABS: 4.0000 mg | ORAL_TABLET | Freq: Four times a day (QID) | ORAL | Status: DC | PRN

## 2018-08-05 MED ORDER — DOPAMINE-DEXTROSE 3.2-5 MG/ML-% IV SOLN
10.0000 ug/kg/min | INTRAVENOUS | Status: DC
  Administered 2018-08-05: 5 ug/kg/min via INTRAVENOUS
  Administered 2018-08-06: 10 ug/kg/min via INTRAVENOUS
  Filled 2018-08-05 (×2): qty 250

## 2018-08-05 MED ORDER — INSULIN ASPART 100 UNIT/ML ~~LOC~~ SOLN
0.0000 [IU] | Freq: Three times a day (TID) | SUBCUTANEOUS | Status: DC
  Administered 2018-08-06: 2 [IU] via SUBCUTANEOUS
  Administered 2018-08-06: 3 [IU] via SUBCUTANEOUS
  Administered 2018-08-06: 2 [IU] via SUBCUTANEOUS
  Administered 2018-08-09: 3 [IU] via SUBCUTANEOUS
  Administered 2018-08-09: 2 [IU] via SUBCUTANEOUS
  Administered 2018-08-09 – 2018-08-10 (×3): 3 [IU] via SUBCUTANEOUS

## 2018-08-05 MED ORDER — ORAL CARE MOUTH RINSE
15.0000 mL | Freq: Two times a day (BID) | OROMUCOSAL | Status: DC
  Administered 2018-08-05 – 2018-08-10 (×8): 15 mL via OROMUCOSAL

## 2018-08-05 MED ORDER — HEPARIN SODIUM (PORCINE) 5000 UNIT/ML IJ SOLN
5000.0000 [IU] | Freq: Three times a day (TID) | INTRAMUSCULAR | Status: DC
  Administered 2018-08-05 – 2018-08-10 (×14): 5000 [IU] via SUBCUTANEOUS
  Filled 2018-08-05 (×14): qty 1

## 2018-08-05 MED ORDER — FLUTICASONE-UMECLIDIN-VILANT 100-62.5-25 MCG/INH IN AEPB: 1.0000 | INHALATION_SPRAY | Freq: Every day | RESPIRATORY_TRACT | Status: DC

## 2018-08-05 MED ORDER — FLUTICASONE FUROATE-VILANTEROL 100-25 MCG/INH IN AEPB
1.0000 | INHALATION_SPRAY | Freq: Every day | RESPIRATORY_TRACT | Status: DC
  Administered 2018-08-07 – 2018-08-10 (×4): 1 via RESPIRATORY_TRACT
  Filled 2018-08-05 (×3): qty 28

## 2018-08-05 MED ORDER — ACETAMINOPHEN 325 MG PO TABS
650.0000 mg | ORAL_TABLET | Freq: Four times a day (QID) | ORAL | Status: DC | PRN
  Administered 2018-08-05 – 2018-08-10 (×6): 650 mg via ORAL
  Filled 2018-08-05 (×6): qty 2

## 2018-08-05 MED ORDER — CANAGLIFLOZIN 100 MG PO TABS: 100.0000 mg | ORAL_TABLET | Freq: Every day | ORAL | Status: DC

## 2018-08-05 MED ORDER — INSULIN ASPART 100 UNIT/ML ~~LOC~~ SOLN
0.0000 [IU] | Freq: Every day | SUBCUTANEOUS | Status: DC
  Administered 2018-08-06: 23:00:00 2 [IU] via SUBCUTANEOUS

## 2018-08-05 MED ORDER — ATROPINE SULFATE 0.4 MG/ML IJ SOLN
0.2000 mg | Freq: Once | INTRAMUSCULAR | Status: AC
  Administered 2018-08-05: 0.2 mg via INTRAVENOUS
  Filled 2018-08-05: qty 0.5

## 2018-08-05 NOTE — ED Triage Notes (Signed)
Pt c/o increased SOB with edema and >5lb wt gain for the past couple of days.

## 2018-08-05 NOTE — Consult Note (Signed)
CRITICAL CARE NOTE      CHIEF COMPLAINT:   Acute on chronic hypoxemic respiratory failure   HPI   This is a pleasant 73 year old female with a history of advanced COPD and chronic hypoxemia on 3 L oxygen at home, chronic anemia, CKD diastolic CHF, central hypertension, dyslipidemia who reports worsening dyspnea and swelling of lower extremities over the past 2 weeks.  She was recently discharged on April 26 with admission for acute on chronic respiratory failure due to decompensated CHF and COPD.  She said since she has been home she progressively has been getting worse and noted increased weight.  Patient states "I have had enough "and called EMS to bring to the ED.  She also reported left posterior shoulder pain which she states she had not seen a doctor for in the past and is chronic however is bothering her and she reported this to the ED which was documented as chief complaint per ED documentation.  She was also found to have bradycardia.  Cardiology consultation was placed for dyspnea and bradycardia arrhythmia which is thought to be due to ongoing beta-blockade with CKD.  Chest x-ray consistent with generous vascular pedicle and interstitial opacification suggestive of pulmonary edema.  Patient denies having flulike illness, cough, fevers, sick contacts, constitutional symptoms.  PAST MEDICAL HISTORY   Past Medical History:  Diagnosis Date  . (HFpEF) heart failure with preserved ejection fraction ([place]) 2017   (1) TTE 2017 a. EF 50% b. mild LVH c. mild MR/TR, d. mild LAE e. mild pulmonary HTN  (2) TTE 06/2018 a. EF 50-55%, mild AS with thickening of valve   . Acute on chronic respiratory failure with hypoxia and hypercapnia ([place]) 01/07/2015  . Anemia   . 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 Asthma   . Cataract   . Chronic kidney  disease 08/10/2017  . CKD (chronic kidney disease)   . COPD (chronic obstructive pulmonary disease) ([place])    (1) 06/2018 tobacco use, home 3L oxygen   . Diabetes mellitus without complication (HCC)    (1) A1C 7.7 (06/2018)  . Edema, peripheral 04/20/2014  . History of kidney stones   . Hyperlipidemia   . Hypertension   . Iron deficiency anemia 06/22/2014  . Leucocytosis 10/19/2015  . Overactive bladder   . Primary osteoarthritis of right knee 09/01/2016  . Renal insufficiency   . Sciatica 01/07/2015     SURGICAL HISTORY   Past Surgical History:  Procedure Laterality Date  . APPENDECTOMY    . CESAREAN SECTION     x3  . CHOLECYSTECTOMY    . COLONOSCOPY WITH PROPOFOL N/A 08/28/2017   Procedure: COLONOSCOPY WITH PROPOFOL;  Surgeon: Lucilla Lame, MD;  Location[facility] ENDOSCOPY;  Service: Endoscopy;  Laterality: N/A;  . COLONOSCOPY WITH PROPOFOL N/A 08/29/2017   Procedure: COLONOSCOPY WITH PROPOFOL;  Surgeon: Lucilla Lame, MD;  Location: [facility] ENDOSCOPY;  Service: Endoscopy;  Laterality: N/A;  . CYSTOSCOPY W/ URETERAL STENT PLACEMENT Right 09/15/2017   Procedure: CYSTOSCOPY WITH RETROGRADE PYELOGRAM/URETERAL STENT PLACEMENT;  Surgeon: Cleon Gustin, MD;  Location: [facility] ORS;  Service: Urology;  Laterality: Right;  . CYSTOSCOPY/URETEROSCOPY/HOLMIUM LASER/STENT PLACEMENT Right 10/09/2017   Procedure: CYSTOSCOPY/URETEROSCOPY/HOLMIUM LASER/STENT PLACEMENT;  Surgeon: Abbie Sons, MD;  Location: [facility] ORS;  Service: Urology;  Laterality: Right;  right Stent exchange  . EYE SURGERY       FAMILY HISTORY   Family History  Problem Relation Age of Onset  . Other Mother        unknown medical  history  . Other Father        unknown medical history     SOCIAL HISTORY   Social History   Tobacco Use  . Smoking status: Former Smoker    Last attempt to quit: 12/04/1992    Years since quitting: 25.6  . Smokeless tobacco: Never Used  Substance Use Topics  . Alcohol use: No  . Drug use: No      MEDICATIONS   Current Medication:  Current Facility-Administered Medications:  .  acetaminophen (TYLENOL) tablet 650 mg, 650 mg, Oral, Q6H PRN **OR** acetaminophen (TYLENOL) suppository 650 mg, 650 mg, Rectal, Q6H PRN, Gladstone Lighter, MD .  Derrill Memo ON 08/06/2018] aspirin chewable tablet 81 mg, 81 mg, Oral, Daily, Gladstone Lighter, MD .  Derrill Memo ON 08/06/2018] atorvastatin (LIPITOR) tablet 10 mg, 10 mg, Oral, Daily, Gladstone Lighter, MD .  Derrill Memo ON 08/06/2018] darifenacin (ENABLEX) 24 hr tablet 15 mg, 15 mg, Oral, Daily, Tressia Miners, Radhika, MD .  DOPamine (INTROPIN) 800 mg in dextrose 5 % 250 mL (3.2 mg/mL) infusion, 10 mcg/kg/min, Intravenous, Titrated, Kalisetti, Hart Rochester, MD, Last Rate: 19.22 mL/hr at 08/05/18 1517, 10 mcg/kg/min at 08/05/18 1517 .  [START ON 08/06/2018] ferrous sulfate tablet 325 mg, 325 mg, Oral, Daily, Gladstone Lighter, MD .  Derrill Memo ON 08/06/2018] fluticasone furoate-vilanterol (BREO ELLIPTA) 100-25 MCG/INH 1 puff, 1 puff, Inhalation, Daily **AND** [START ON 08/06/2018] umeclidinium bromide (INCRUSE ELLIPTA) 62.5 MCG/INH 1 puff, 1 puff, Inhalation, Daily, Kalisetti, Radhika, MD .  heparin injection 5,000 Units, 5,000 Units, Subcutaneous, Q8H, Kalisetti, Radhika, MD .  insulin aspart (novoLOG) injection 0-5 Units, 0-5 Units, Subcutaneous, QHS, Kalisetti, Radhika, MD .  insulin aspart (novoLOG) injection 0-9 Units, 0-9 Units, Subcutaneous, TID WC, Kalisetti, Radhika, MD .  metFORMIN (GLUCOPHAGE) tablet 1,000 mg, 1,000 mg, Oral, BID WC, Kalisetti, Radhika, MD .  montelukast (SINGULAIR) tablet 10 mg, 10 mg, Oral, QHS, Kalisetti, Radhika, MD .  ondansetron (ZOFRAN) tablet 4 mg, 4 mg, Oral, Q6H PRN **OR** ondansetron (ZOFRAN) injection 4 mg, 4 mg, Intravenous, Q6H PRN, Gladstone Lighter, MD .  vitamin B-12 (CYANOCOBALAMIN) tablet 500 mcg, 500 mcg, Oral, Daily, Tressia Miners, Radhika, MD    ALLERGIES   Ace inhibitors; Gabapentin; Lisinopril; Lyrica [pregabalin]; and Shrimp  [shellfish allergy]    REVIEW OF SYSTEMS    10 point ROS conducted and is negative except left shoulder pain.  PHYSICAL EXAMINATION   Vitals:   08/05/18 1512 08/05/18 1547  BP: (!) 87/44 (!) 84/58  Pulse:  68  Resp:  (!) 21  Temp:  (!) 97.5 F (36.4 C)  SpO2:  95%    GENERAL: Moderate distress due to respiratory failure HEAD: Normocephalic, atraumatic.  EYES: Pupils equal, round, reactive to light.  No scleral icterus.  MOUTH: Moist mucosal membrane. NECK: Supple. No thyromegaly. No nodules. No JVD.  PULMONARY: Crackles at the bases bilaterally CARDIOVASCULAR: S1 and S2. Regular rate and rhythm. No murmurs, rubs, or gallops.  GASTROINTESTINAL: Soft, nontender, non-distended. No masses. Positive bowel sounds. No hepatosplenomegaly.  MUSCULOSKELETAL: No swelling, clubbing, or edema.  NEUROLOGIC: Mild distress due to acute illness SKIN:intact,warm,dry   LABS AND IMAGING     LAB RESULTS: Recent Labs  Lab 08/05/18 1117  NA 133*  K 5.1  CL 98  CO2 22  BUN 54*  CREATININE 1.91*  GLUCOSE 138*   Recent Labs  Lab 08/05/18 1117  HGB 11.3*  HCT 37.4  WBC 11.5*  PLT 285     IMAGING RESULTS: Dg Chest Portable 1 View  Result Date: 08/05/2018  CLINICAL DATA:  Shortness of breath, edema EXAM: PORTABLE CHEST 1 VIEW COMPARISON:  06/25/2018 FINDINGS: Cardiomegaly. No significant interval change mild, diffuse interstitial pulmonary opacity and pulmonary vascular prominence. No new or focal airspace opacity. IMPRESSION: Cardiomegaly. No significant interval change mild, diffuse interstitial pulmonary opacity and pulmonary vascular prominence, consistent with mild edema. No new or focal airspace opacity. Electronically Signed   By: Eddie Candle M.D.   On: 08/05/2018 11:36    FINDINGS  Left Ventricle: The left ventricle has low normal systolic function, with an ejection fraction of 50-55%. The cavity size was normal. There is mild concentric left ventricular hypertrophy. Left  ventricular diastolic Doppler parameters are indeterminate.  Right Ventricle: The right ventricle has low normal systolic function. The cavity was mildly enlarged. There is no increase in right ventricular wall thickness. Right ventricular systolic pressure is moderately elevated with an estimated pressure of 55.4  mmHg.  Left Atrium: Left atrial size was mildly dilated.  Right Atrium: Right atrial size was mildly dilated. Right atrial pressure is estimated at 10 mmHg.  Interatrial Septum: No atrial level shunt detected by color flow Doppler.  Pericardium: There is no evidence of pericardial effusion.  Mitral Valve: The mitral valve is grossly normal. Mitral valve regurgitation is mild by color flow Doppler.  Tricuspid Valve: The tricuspid valve is grossly normal. Tricuspid valve regurgitation is mild-moderate by color flow Doppler.  Aortic Valve: The aortic valve is grossly normal Mild thickening of the aortic valve. Mild calcification of the aortic valve. Aortic valve regurgitation was not assessed by color flow Doppler. There is Mild stenosis of the aortic valve, with a calculated  valve area of 0.90 cm.  Pulmonic Valve: The pulmonic valve was grossly normal. Pulmonic valve regurgitation is trivial by color flow Doppler.  Venous: The inferior vena cava is dilated in size with less than 50% respiratory variability.       ASSESSMENT AND PLAN    -Multidisciplinary rounds held today  Acute on chronic hypoxemic respiratory failure -Likely due to acute decompensated systolic and diastolic CHF -Cardiology on case appreciate input -Diuresing with Lasix at this time -continue BiPAP as needed -Strict I's and O's and restrict fluid intake to 1200 cc per 24 hours  -Junctional bradycardia  -Cardiology on case-appreciate input -Likely due to beta-blockade status post dopamine gtt. -Improved no indication for temporary pacemaker -Lasix as tolerated -follow up cardiac  enzymes as indicated ICU monitoring  Renal Failure-CKD stage III -follow chem 7 -follow UO -continue Foley Catheter-assess need daily  Advanced COPD -DuoNebs every 6 as needed -No signs of acute exacerbation -No need for IV steroids - Pulmicort staggered with duo nebs   GI/Nutrition GI PROPHYLAXIS as indicated DIET-->TF's as tolerated Constipation protocol as indicated  ENDO - ICU hypoglycemic\Hyperglycemia protocol -check FSBS per protocol   ELECTROLYTES -follow labs as needed -replace as needed -pharmacy consultation   DVT/GI PRX ordered -SCDs  TRANSFUSIONS AS NEEDED MONITOR FSBS ASSESS the need for LABS as needed   Critical care provider statement:    Critical care time (minutes):  32   Critical care time was exclusive of:  Separately billable procedures and treating other patients   Critical care was necessary to treat or prevent imminent or life-threatening deterioration of the following conditions:   Acute on chronic hypoxemic respiratory failure, junctional bradycardia, acute decompensated systolic and diastolic CHF, advanced COPD, multiple comorbid conditions   Critical care was time spent personally by me on the following activities:  Development of treatment plan with  patient or surrogate, discussions with consultants, evaluation of patient's response to treatment, examination of patient, obtaining history from patient or surrogate, ordering and performing treatments and interventions, ordering and review of laboratory studies and re-evaluation of patient's condition.  I assumed direction of critical care for this patient from another provider in my specialty: no    This document was prepared using Dragon voice recognition software and may include unintentional dictation errors.    Ottie Glazier, M.D.  Division of [place]

## 2018-08-05 NOTE — H&P (Signed)
[facility] NAME: Karen [name]    MR#:  [id number]  DATE OF BIRTH:  18-Jun-1945  DATE OF ADMISSION:  08/05/2018  PRIMARY CARE PHYSICIAN: Ricardo Jericho, NP   REQUESTING/REFERRING PHYSICIAN: Dr. Fonnie Birkenhead  CHIEF COMPLAINT:   Chief Complaint  Patient presents with  . Edema  . Shortness of Breath    HISTORY OF PRESENT ILLNESS:  Karen [name]  is a 73 y.o. female with a known history of chronic respiratory failure secondary to COPD on 3 L home oxygen, diastolic CHF, hypertension, anemia, osteoarthritis presents to hospital secondary to worsening weight gain and shortness of breath. Patient was in the hospital 6 weeks ago for CHF exacerbation and was discharged on beta-blocker and Lasix.  She felt like her pedal edema has been worsening over the past week with some shortness of breath so presented to the emergency room.  She also gained weight, almost 8 to 10 pounds within the last week. She was noted to be bradycardic with heart rate into the 30s, hypotensive.  Received atropine and was started on dopamine drip.  BNP elevated at 826.  Also labs showing acute renal failure.  PAST MEDICAL HISTORY:   Past Medical History:  Diagnosis Date  . (HFpEF) heart failure with preserved ejection fraction ([place]) 2017   (1) TTE 2017 a. EF 50% b. mild LVH c. mild MR/TR, d. mild LAE e. mild pulmonary HTN  (2) TTE 06/2018 a. EF 50-55%, mild AS with thickening of valve   . Acute on chronic respiratory failure with hypoxia and hypercapnia ([place]) 01/07/2015  . Anemia   . 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 Asthma   . Cataract   . Chronic kidney disease 08/10/2017  . CKD (chronic kidney disease)   . COPD (chronic obstructive pulmonary disease) ([place])    (1) 06/2018 tobacco use, home 3L oxygen   . Diabetes mellitus without complication (HCC)    (1) A1C 7.7 (06/2018)  . Edema, peripheral 04/20/2014  . History of kidney stones   . Hyperlipidemia   .  Hypertension   . Iron deficiency anemia 06/22/2014  . Leucocytosis 10/19/2015  . Overactive bladder   . Primary osteoarthritis of right knee 09/01/2016  . Renal insufficiency   . Sciatica 01/07/2015    PAST SURGICAL HISTORY:   Past Surgical History:  Procedure Laterality Date  . APPENDECTOMY    . CESAREAN SECTION     x3  . CHOLECYSTECTOMY    . COLONOSCOPY WITH PROPOFOL N/A 08/28/2017   Procedure: COLONOSCOPY WITH PROPOFOL;  Surgeon: Lucilla Lame, MD;  Location: [facility] ENDOSCOPY;  Service: Endoscopy;  Laterality: N/A;  . COLONOSCOPY WITH PROPOFOL N/A 08/29/2017   Procedure: COLONOSCOPY WITH PROPOFOL;  Surgeon: Lucilla Lame, MD;  Location: [facility] ENDOSCOPY;  Service: Endoscopy;  Laterality: N/A;  . CYSTOSCOPY W/ URETERAL STENT PLACEMENT Right 09/15/2017   Procedure: CYSTOSCOPY WITH RETROGRADE PYELOGRAM/URETERAL STENT PLACEMENT;  Surgeon: Cleon Gustin, MD;  Location: [facility] ORS;  Service: Urology;  Laterality: Right;  . CYSTOSCOPY/URETEROSCOPY/HOLMIUM LASER/STENT PLACEMENT Right 10/09/2017   Procedure: CYSTOSCOPY/URETEROSCOPY/HOLMIUM LASER/STENT PLACEMENT;  Surgeon: Abbie Sons, MD;  Location: [facility] ORS;  Service: Urology;  Laterality: Right;  right Stent exchange  . EYE SURGERY      SOCIAL HISTORY:   Social History   Tobacco Use  . Smoking status: Former Smoker    Last attempt to quit: 12/04/1992    Years since quitting: 25.6  . Smokeless tobacco: Never Used  Substance Use Topics  .  Alcohol use: No    FAMILY HISTORY:   Family History  Problem Relation Age of Onset  . Other Mother        unknown medical history  . Other Father        unknown medical history    DRUG ALLERGIES:   Allergies  Allergen Reactions  . Ace Inhibitors   . Gabapentin Hives  . Lisinopril   . Lyrica [Pregabalin]   . Shrimp [Shellfish Allergy] Swelling    Swelling of the lips    REVIEW OF SYSTEMS:   ROS  MEDICATIONS AT HOME:   Prior to Admission medications   Medication Sig Start Date  End Date Taking? Authorizing Provider  aspirin 81 MG chewable tablet Chew 81 mg by mouth daily. On hold   Yes [provider]  atorvastatin (LIPITOR) 10 MG tablet Take 1 tablet by mouth daily.   Yes [provider]  bisoprolol (ZEBETA) 5 MG tablet Take 1 tablet (5 mg total) by mouth daily. 07/30/18  Yes Hackney, Tina A, FNP  canagliflozin (INVOKANA) 100 MG TABS tablet Take 1 tablet by mouth daily.   Yes [provider]  cyclobenzaprine (FLEXERIL) 10 MG tablet Take 1 tablet (10 mg total) by mouth 3 (three) times daily as needed for muscle spasms. 08/02/18  Yes Norval Gable, MD  Ferrous Sulfate (IRON) 325 (65 Fe) MG TABS Take 1 tablet by mouth daily. 09/14/17  Yes [provider]  fluticasone furoate-vilanterol (BREO ELLIPTA) 100-25 MCG/INH AEPB Inhale 1 puff into the lungs daily.   Yes [provider]  furosemide (LASIX) 40 MG tablet Take 1 tablet (40 mg total) by mouth daily. 07/01/18  Yes Epifanio Lesches, MD  Insulin Human (INSULIN PUMP) SOLN Inject 1 each into the skin 3 times daily with meals, bedtime and 2 AM. Patient taking differently: Inject 1 each into the skin 3 times daily with meals, bedtime and 2 AM. Pt uses Novolog 09/18/17  Yes Dustin Flock, MD  losartan (COZAAR) 100 MG tablet Take 100 mg by mouth daily. 09/12/17  Yes [provider]  metFORMIN (GLUCOPHAGE) 500 MG tablet Take 1,000 mg by mouth 2 (two) times daily with a meal.    Yes [provider]  montelukast (SINGULAIR) 10 MG tablet Take 10 mg by mouth at bedtime.   Yes [provider]  solifenacin (VESICARE) 10 MG tablet Take 1 tablet (10 mg total) by mouth daily. 07/23/18  Yes Stoioff, Ronda Fairly, MD  vitamin B-12 (CYANOCOBALAMIN) 500 MCG tablet Take 500 mcg by mouth daily.   Yes [provider]  estradiol (ESTRACE VAGINAL) 0.1 MG/GM vaginal cream Apply 0.54m (pea-sized amount)  just inside the vaginal introitus with a finger-tip on Monday, Wednesday  and Friday nights. Patient not taking: Reported on 08/05/2018 08/31/17   MZara CouncilA, PA-C  Fluticasone-Umeclidin-Vilant 100-62.5-25 MCG/INH AEPB Inhale 1 puff into the lungs daily. 12/25/17   [provider]  Insulin Disposable Pump (V-GO 40) KIT Inject into the skin as directed. 08/14/17   [provider]  OXYGEN Place 3 L/min into the nose.     [provider]      VITAL SIGNS:  Blood pressure (!) 87/44, pulse (!) 58, temperature 97.7 F (36.5 C), temperature source Oral, resp. rate (!) 24, height '5\' 3"'  (1.6 m), weight 102.5 kg, SpO2 92 %.  PHYSICAL EXAMINATION:  Physical Exam  GENERAL:  73y.o.-year-old obese patient lying in the bed with no acute distress.  EYES: Pupils equal, round, reactive to  light and accommodation. No scleral icterus. Extraocular muscles intact.  HEENT: Head atraumatic, normocephalic. Oropharynx and nasopharynx clear.  NECK:  Supple, no jugular venous distention. No thyroid enlargement, no tenderness.  LUNGS: Normal breath sounds bilaterally, no wheezing, rhonchi or crepitation. No use of accessory muscles of respiration.  Decreased bibasilar breath sounds with fine rales at the bases. CARDIOVASCULAR: S1, S2 normal. No murmurs, rubs, or gallops.  ABDOMEN: Soft, nontender, nondistended. Bowel sounds present. No organomegaly or mass.  EXTREMITIES: No cyanosis, or clubbing.  2+ lower extremity edema noted NEUROLOGIC: Cranial nerves II through XII are intact. Muscle strength 5/5 in all extremities. Sensation intact. Gait not checked.  PSYCHIATRIC: The patient is alert and oriented x 3.  SKIN: No obvious rash, lesion, or ulcer.   LABORATORY PANEL:   CBC Recent Labs  Lab 08/05/18 1117  WBC 11.5*  HGB 11.3*  HCT 37.4  PLT 285   ------------------------------------------------------------------------------------------------------------------  Chemistries  Recent Labs  Lab 08/05/18 1117  NA 133*  K 5.1  CL 98  CO2 22   GLUCOSE 138*  BUN 54*  CREATININE 1.91*  CALCIUM 8.6*  MG 2.4  AST 19  ALT 15  ALKPHOS 72  BILITOT 0.4   ------------------------------------------------------------------------------------------------------------------  Cardiac Enzymes Recent Labs  Lab 08/05/18 1117  TROPONINI <0.03   ------------------------------------------------------------------------------------------------------------------  RADIOLOGY:  Dg Chest Portable 1 View  Result Date: 08/05/2018 CLINICAL DATA:  Shortness of breath, edema EXAM: PORTABLE CHEST 1 VIEW COMPARISON:  06/25/2018 FINDINGS: Cardiomegaly. No significant interval change mild, diffuse interstitial pulmonary opacity and pulmonary vascular prominence. No new or focal airspace opacity. IMPRESSION: Cardiomegaly. No significant interval change mild, diffuse interstitial pulmonary opacity and pulmonary vascular prominence, consistent with mild edema. No new or focal airspace opacity. Electronically Signed   By: Eddie Candle M.D.   On: 08/05/2018 11:36      IMPRESSION AND PLAN:   Karen [name]  is a 73 y.o. female with a known history of chronic respiratory failure secondary to COPD on 3 L home oxygen, diastolic CHF, hypertension, anemia, osteoarthritis presents to hospital secondary to worsening weight gain and shortness of breath.  1.  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  However patient denies any dizziness syncope or presyncopal episodes. -Hold beta-blockers.  She was started recently on bisoprolol. -Cardiology consult requested.  Received a dose of atropine in the ED -Started on dopamine drip.  If heart rate remains bradycardic, and hypotensive-consider temporary pacer. -Correct electrolytes.  Echocardiogram has been done.  Temporary pacer pads in place. -Cardiology has been consulted  2.  Acute on chronic diastolic heart failure-hypertensive, will hold Lasix at this time.  On dopamine drip.  Echocardiogram done  bedside showing EF of 50 to 55% but has moderate to severe pulmonary hypertension -Continue daily weights and input and output monitoring -Appreciate cardiology consult  3.  Acute renal failure-likely ATN secondary to hypotension. -Given heart failure, hypertension-we will consult nephrology.  4.  Hyperkalemia-hold losartan.  Continue to monitor  5.  Chronic respiratory failure secondary to COPD-stable, on 3 L chronic home oxygen.  Continue inhalers.  6.  DVT prophylaxis-on subcu heparin  Independent at baseline, ambulates with a walker   All the records are reviewed and case discussed with ED provider. Management plans discussed with the patient, family and they are in agreement.  CODE STATUS: Full Code  TOTAL CRITICAL CARE TIME SPENT IN TAKING CARE OF THIS PATIENT: 55 minutes.    Gladstone Lighter M.D on 08/05/2018 at 3:42 PM  Between 7am to 6pm -  Pager - [phone]  After 6pm go to [email or web address] - [employer] [facility]  Office  [phone]  CC: Primary care physician; White, Orlene Och, NP   Note: This dictation was prepared with Dragon dictation along with smaller phrase technology. Any transcriptional errors that result from this process are unintentional.

## 2018-08-05 NOTE — ED Notes (Signed)
Patient moved to recliner chair for comfort. Patient sitting up, still attached to cardiac monitor. Patient able to transfer from bed to chair with standby assistance only.

## 2018-08-05 NOTE — ED Notes (Signed)
ED TO INPATIENT HANDOFF REPORT  ED Nurse Name and Phone #:  Mickel Baas [phone]  A Name/Age/Gender Karen [name] 73 y.o. female Room/Bed: ED01A/ED01A  Code Status   Code Status: Prior  Home/SNF/Other Home Patient oriented to: self, place, time and situation Is this baseline? Yes   Triage Complete: Triage complete  Chief Complaint Edema   Triage Note Pt c/o increased SOB with edema and >5lb wt gain for the past couple of days.    Allergies Allergies  Allergen Reactions  . Ace Inhibitors   . Gabapentin Hives  . Lisinopril   . Lyrica [Pregabalin]   . Shrimp [Shellfish Allergy] Swelling    Swelling of the lips    Level of Care/Admitting Diagnosis ED Disposition    ED Disposition Condition [facility] Area: [facility] [address] [[id number]]  Level of Care: ICU [6]  Covid Evaluation: N/A  Diagnosis: Symptomatic bradycardia [[id number]]  Admitting Physician: Gladstone Lighter [[id number]]  Attending Physician: Gladstone Lighter [[id number]]  Estimated length of stay: past midnight tomorrow  Certification:: I certify this patient will need inpatient services for at least 2 midnights  PT Class (Do Not Modify): Inpatient [101]  PT Acc Code (Do Not Modify): Private [1]       B Medical/Surgery History Past Medical History:  Diagnosis Date  . (HFpEF) heart failure with preserved ejection fraction ([place]) 2017   (1) TTE 2017 a. EF 50% b. mild LVH c. mild MR/TR, d. mild LAE e. mild pulmonary HTN  (2) TTE 06/2018 a. EF 50-55%, mild AS with thickening of valve   . Acute on chronic respiratory failure with hypoxia and hypercapnia ([place]) 01/07/2015  . Anemia   . 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 Asthma   . Cataract   . Chronic kidney disease 08/10/2017  . CKD (chronic kidney disease)   . COPD (chronic obstructive pulmonary disease) ([place])    (1) 06/2018 tobacco use, home 3L oxygen   . Diabetes mellitus without complication (HCC)    (1) A1C 7.7 (06/2018)  . Edema, peripheral  04/20/2014  . History of kidney stones   . Hyperlipidemia   . Hypertension   . Iron deficiency anemia 06/22/2014  . Leucocytosis 10/19/2015  . Overactive bladder   . Primary osteoarthritis of right knee 09/01/2016  . Renal insufficiency   . Sciatica 01/07/2015   Past Surgical History:  Procedure Laterality Date  . APPENDECTOMY    . CESAREAN SECTION     x3  . CHOLECYSTECTOMY    . COLONOSCOPY WITH PROPOFOL N/A 08/28/2017   Procedure: COLONOSCOPY WITH PROPOFOL;  Surgeon: Lucilla Lame, MD;  Location: [facility] ENDOSCOPY;  Service: Endoscopy;  Laterality: N/A;  . COLONOSCOPY WITH PROPOFOL N/A 08/29/2017   Procedure: COLONOSCOPY WITH PROPOFOL;  Surgeon: Lucilla Lame, MD;  Location: [facility] ENDOSCOPY;  Service: Endoscopy;  Laterality: N/A;  . CYSTOSCOPY W/ URETERAL STENT PLACEMENT Right 09/15/2017   Procedure: CYSTOSCOPY WITH RETROGRADE PYELOGRAM/URETERAL STENT PLACEMENT;  Surgeon: Cleon Gustin, MD;  Location: [facility] ORS;  Service: Urology;  Laterality: Right;  . CYSTOSCOPY/URETEROSCOPY/HOLMIUM LASER/STENT PLACEMENT Right 10/09/2017   Procedure: CYSTOSCOPY/URETEROSCOPY/HOLMIUM LASER/STENT PLACEMENT;  Surgeon: Abbie Sons, MD;  Location: [facility] ORS;  Service: Urology;  Laterality: Right;  right Stent exchange  . EYE SURGERY       A IV Location/Drains/Wounds Patient Lines/Drains/Airways Status   Active Line/Drains/Airways    Name:   Placement date:   Placement time:   Site:   Days:   Peripheral IV 08/05/18 Left Hand   08/05/18  1059    Hand   less than 1   Peripheral IV 08/05/18 Left;Posterior Forearm   08/05/18    1326    Forearm   less than 1   Ureteral Drain/Stent Right ureter 6 Fr.   10/09/17    0935    Right ureter   300   External Urinary Catheter   06/25/18    1428    -   41   Incision (Closed) 10/09/17 Vagina Other (Comment)   10/09/17    0920     300          Intake/Output Last 24 hours No intake or output data in the 24 hours ending 08/05/18 1448  Labs/Imaging Results for  orders placed or performed during the hospital encounter of 08/05/18 (from the past 48 hour(s))  CBC with Differential     Status: Abnormal   Collection Time: 08/05/18 11:17 AM  Result Value Ref Range   WBC 11.5 (H) 4.0 - 10.5 K/uL   RBC 4.13 3.87 - 5.11 MIL/uL   Hemoglobin 11.3 (L) 12.0 - 15.0 g/dL   HCT 37.4 36.0 - 46.0 %   MCV 90.6 80.0 - 100.0 fL   MCH 27.4 26.0 - 34.0 pg   MCHC 30.2 30.0 - 36.0 g/dL   RDW 16.6 (H) 11.5 - 15.5 %   Platelets 285 150 - 400 K/uL   nRBC 0.0 0.0 - 0.2 %   Neutrophils Relative % 70 %   Neutro Abs 8.2 (H) 1.7 - 7.7 K/uL   Lymphocytes Relative 16 %   Lymphs Abs 1.9 0.7 - 4.0 K/uL   Monocytes Relative 9 %   Monocytes Absolute 1.0 0.1 - 1.0 K/uL   Eosinophils Relative 3 %   Eosinophils Absolute 0.3 0.0 - 0.5 K/uL   Basophils Relative 1 %   Basophils Absolute 0.1 0.0 - 0.1 K/uL   Immature Granulocytes 1 %   Abs Immature Granulocytes 0.06 0.00 - 0.07 K/uL    Comment: Performed at [facility], [address] [place], [place] [address]  Comprehensive metabolic panel     Status: Abnormal   Collection Time: 08/05/18 11:17 AM  Result Value Ref Range   Sodium 133 (L) 135 - 145 mmol/L   Potassium 5.1 3.5 - 5.1 mmol/L   Chloride 98 98 - 111 mmol/L   CO2 22 22 - 32 mmol/L   Glucose, Bld 138 (H) 70 - 99 mg/dL   BUN 54 (H) 8 - 23 mg/dL   Creatinine, Ser 1.91 (H) 0.44 - 1.00 mg/dL   Calcium 8.6 (L) 8.9 - 10.3 mg/dL   Total Protein 6.6 6.5 - 8.1 g/dL   Albumin 3.5 3.5 - 5.0 g/dL   AST 19 15 - 41 U/L   ALT 15 0 - 44 U/L   Alkaline Phosphatase 72 38 - 126 U/L   Total Bilirubin 0.4 0.3 - 1.2 mg/dL   GFR calc non Af Amer 25 (L) >60 mL/min   GFR calc Af Amer 29 (L) >60 mL/min   Anion gap 13 5 - 15    Comment: Performed at [facility], [address] [place], [place] [address]  Troponin I - ONCE - STAT     Status: None   Collection Time: 08/05/18 11:17 AM  Result Value Ref Range   Troponin I <0.03 <0.03 ng/mL    Comment: Performed at  [facility], [address] [place], [place] [address]  Magnesium     Status: None  Collection Time: 08/05/18 11:17 AM  Result Value Ref Range   Magnesium 2.4 1.7 - 2.4 mg/dL    Comment: Performed at [facility], [address] [place], [place] [address]  Brain natriuretic peptide     Status: Abnormal   Collection Time: 08/05/18 11:18 AM  Result Value Ref Range   B Natriuretic Peptide 826.0 (H) 0.0 - 100.0 pg/mL    Comment: Performed at [facility], [address] [place], [place] [address]  SARS Coronavirus 2 (CEPHEID - Performed in [facility] hospital lab), Hosp Order     Status: None   Collection Time: 08/05/18 11:42 AM  Result Value Ref Range   SARS Coronavirus 2 NEGATIVE NEGATIVE    Comment: (NOTE) If result is NEGATIVE SARS-CoV-2 target nucleic acids are NOT DETECTED. The SARS-CoV-2 RNA is generally detectable in upper and lower  respiratory specimens during the acute phase of infection. The lowest  concentration of SARS-CoV-2 viral copies this assay can detect is 250  copies / mL. A negative result does not preclude SARS-CoV-2 infection  and should not be used as the sole basis for treatment or other  patient management decisions.  A negative result may occur with  improper specimen collection / handling, submission of specimen other  than nasopharyngeal swab, presence of viral mutation(s) within the  areas targeted by this assay, and inadequate number of viral copies  (<250 copies / mL). A negative result must be combined with clinical  observations, patient history, and epidemiological information. If result is POSITIVE SARS-CoV-2 target nucleic acids are DETECTED. The SARS-CoV-2 RNA is generally detectable in upper and lower  respiratory specimens dur ing the acute phase of infection.  Positive  results are indicative of active infection with SARS-CoV-2.  Clinical  correlation with patient history and other diagnostic information is   necessary to determine patient infection status.  Positive results do  not rule out bacterial infection or co-infection with other viruses. If result is PRESUMPTIVE POSTIVE SARS-CoV-2 nucleic acids MAY BE PRESENT.   A presumptive positive result was obtained on the submitted specimen  and confirmed on repeat testing.  While 2019 novel coronavirus  (SARS-CoV-2) nucleic acids may be present in the submitted sample  additional confirmatory testing may be necessary for epidemiological  and / or clinical management purposes  to differentiate between  SARS-CoV-2 and other Sarbecovirus currently known to infect humans.  If clinically indicated additional testing with an alternate test  methodology [id number]) is advised. The SARS-CoV-2 RNA is generally  detectable in upper and lower respiratory sp ecimens during the acute  phase of infection. The expected result is Negative. Fact Sheet for Patients:  StrictlyIdeas.no Fact Sheet for Healthcare Providers: BankingDealers.co.za This test is not yet approved or cleared by the [place] FDA and has been authorized for detection and/or diagnosis of SARS-CoV-2 by FDA under an Emergency Use Authorization (EUA).  This EUA will remain in effect (meaning this test can be used) for the duration of the COVID-19 declaration under Section 564(b)(1) of the Act, 21 U.S.C. section 360bbb-3(b)(1), unless the authorization is terminated or revoked sooner. Performed at [facility], [address] [place], [place] [address]    Dg Chest Portable 1 View  Result Date: 08/05/2018 CLINICAL DATA:  Shortness of breath, edema EXAM: PORTABLE CHEST 1 VIEW COMPARISON:  06/25/2018 FINDINGS: Cardiomegaly. No significant interval change mild, diffuse interstitial pulmonary opacity and pulmonary vascular prominence. No new or focal airspace opacity. IMPRESSION: Cardiomegaly. No significant interval change mild,  diffuse  interstitial pulmonary opacity and pulmonary vascular prominence, consistent with mild edema. No new or focal airspace opacity. Electronically Signed   By: Eddie Candle M.D.   On: 08/05/2018 11:36    Pending Labs Unresulted Labs (From admission, onward)    Start     Ordered   08/05/18 1415  TSH  Add-on,   AD     08/05/18 1414   Signed and Held  Basic metabolic panel  Tomorrow morning,   R     Signed and Held   Signed and Held  CBC  Tomorrow morning,   R     Signed and Held   Signed and Held  TSH  Once,   R     Signed and Held   Signed and Held  Brain natriuretic peptide  Once,   R     Signed and Held          Vitals/Pain Today's Vitals   08/05/18 1300 08/05/18 1330 08/05/18 1345 08/05/18 1400  BP: 92/65 (!) 93/44 (!) 87/50 (!) 98/51  Pulse: (!) 43 (!) 43 (!) 44   Resp: 19 (!) 22 19 (!) 22  Temp:      TempSrc:      SpO2: 100% 100% 100%   Weight:      Height:      PainSc:        Isolation Precautions No active isolations  Medications Medications  DOPamine (INTROPIN) 800 mg in dextrose 5 % 250 mL (3.2 mg/mL) infusion (5 mcg/kg/min  102.5 kg Intravenous New Bag/Given 08/05/18 1411)  atropine injection 0.2 mg (0.2 mg Intravenous Given 08/05/18 1306)    Mobility walks with person assist Moderate fall risk   Focused Assessments Pulmonary Assessment Handoff:  Lung sounds: Bilateral Breath Sounds: Diminished O2 Device: Nasal Cannula O2 Flow Rate (L/min): 3 L/min      R Recommendations: See Admitting Provider Note  Report given to:   Additional Notes:  Patient HR in 40s and 50s. This is below baseline for her. Echo done at bedside in ED.

## 2018-08-05 NOTE — ED Provider Notes (Addendum)
[facility] EMERGENCY DEPARTMENT Provider Note   CSN: [id number] Arrival date & time: 08/05/18  1016    History   Chief Complaint Chief Complaint  Patient presents with  . Edema  . Shortness of Breath    HPI Karen [name] is a 73 y.o. female.     HPI 73 year old female with past medical history of severe heart failure, hypertension, hyperlipidemia, here with SOB. Pt states that over the past several weeks, she's had progressively worsening SOB, lightheadedness, and dizziness. She's had increased weight gain as well with >10 lb weight gain in past few days. She's had DOE, fatigue. No Chest pain. No sputum production. She's been peeing less than usual as well. No n/v. She's been trying to restrict her water intake. Endorses increases LE edema.  Past Medical History:  Diagnosis Date  . (HFpEF) heart failure with preserved ejection fraction ([place]) 2017   (1) TTE 2017 a. EF 50% b. mild LVH c. mild MR/TR, d. mild LAE e. mild pulmonary HTN  (2) TTE 06/2018 a. EF 50-55%, mild AS with thickening of valve   . Acute on chronic respiratory failure with hypoxia and hypercapnia ([place]) 01/07/2015  . Anemia   . 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 Asthma   . Cataract   . Chronic kidney disease 08/10/2017  . CKD (chronic kidney disease)   . COPD (chronic obstructive pulmonary disease) ([place])    (1) 06/2018 tobacco use, home 3L oxygen   . Diabetes mellitus without complication (HCC)    (1) A1C 7.7 (06/2018)  . Edema, peripheral 04/20/2014  . History of kidney stones   . Hyperlipidemia   . Hypertension   . Iron deficiency anemia 06/22/2014  . Leucocytosis 10/19/2015  . Overactive bladder   . Primary osteoarthritis of right knee 09/01/2016  . Renal insufficiency   . Sciatica 01/07/2015    Patient Active Problem List   Diagnosis Date Noted  . Acute on chronic respiratory failure ([place]) 06/25/2018  . 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 diabetes mellitus ([place]) 01/25/2018   . History of non anemic vitamin B12 deficiency 01/25/2018  . Personal history of kidney stones 11/12/2017  . Urge incontinence 11/12/2017  . Right ureteral stone 09/15/2017  . Acute GI bleeding   . GI bleed 08/26/2017  . Arthritis 08/10/2017  . Chronic kidney disease 08/10/2017  . COPD (chronic obstructive pulmonary disease) ([place]) 08/10/2017  . Diabetes mellitus type 2, uncomplicated ([place]) 40/10/6759  . Hypertension 08/10/2017  . Obesity (BMI 35.0-39.9 without comorbidity) 04/11/2017  . Primary osteoarthritis of right knee 09/01/2016  . Leucocytosis 10/19/2015  . 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 Acute on chronic respiratory failure with hypoxia and hypercapnia ([place]) 01/07/2015  . Sciatica 01/07/2015  . Weakness 01/07/2015  . 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 Iron deficiency anemia 06/22/2014  . Microalbuminuria 06/22/2014  . CHF (congestive heart failure) ([place]) 04/20/2014  . Edema, peripheral 04/20/2014    Past Surgical History:  Procedure Laterality Date  . APPENDECTOMY    . CESAREAN SECTION     x3  . CHOLECYSTECTOMY    . COLONOSCOPY WITH PROPOFOL N/A 08/28/2017   Procedure: COLONOSCOPY WITH PROPOFOL;  Surgeon: Lucilla Lame, MD;  Location: [facility] ENDOSCOPY;  Service: Endoscopy;  Laterality: N/A;  . COLONOSCOPY WITH PROPOFOL N/A 08/29/2017   Procedure: COLONOSCOPY WITH PROPOFOL;  Surgeon: Lucilla Lame, MD;  Location: [facility] ENDOSCOPY;  Service: Endoscopy;  Laterality: N/A;  . CYSTOSCOPY W/ URETERAL STENT PLACEMENT Right 09/15/2017   Procedure:  CYSTOSCOPY WITH RETROGRADE PYELOGRAM/URETERAL STENT PLACEMENT;  Surgeon: Cleon Gustin, MD;  Location: [facility] ORS;  Service: Urology;  Laterality: Right;  . CYSTOSCOPY/URETEROSCOPY/HOLMIUM LASER/STENT PLACEMENT Right 10/09/2017   Procedure: CYSTOSCOPY/URETEROSCOPY/HOLMIUM LASER/STENT PLACEMENT;  Surgeon: Abbie Sons, MD;  Location: [facility] ORS;  Service: Urology;  Laterality: Right;  right Stent exchange  . EYE  SURGERY       OB History   No obstetric history on file.      Home Medications    Prior to Admission medications   Medication Sig Start Date End Date Taking? Authorizing Provider  aspirin 81 MG chewable tablet Chew 81 mg by mouth daily. On hold    [provider]  atorvastatin (LIPITOR) 10 MG tablet Take 1 tablet by mouth daily.    [provider]  bisoprolol (ZEBETA) 5 MG tablet Take 1 tablet (5 mg total) by mouth daily. 07/30/18   Alisa Graff, FNP  canagliflozin (INVOKANA) 100 MG TABS tablet Take 1 tablet by mouth daily.    [provider]  cyclobenzaprine (FLEXERIL) 10 MG tablet Take 1 tablet (10 mg total) by mouth 3 (three) times daily as needed for muscle spasms. 08/02/18   Norval Gable, MD  estradiol (ESTRACE VAGINAL) 0.1 MG/GM vaginal cream Apply 0.72m (pea-sized amount)  just inside the vaginal introitus with a finger-tip on Monday, Wednesday and Friday nights. Patient taking differently: daily. . 08/31/17   MNori Riis PA-C  Ferrous Sulfate (IRON) 325 (65 Fe) MG TABS Take 1 tablet by mouth daily. 09/14/17   [provider]  fluticasone furoate-vilanterol (BREO ELLIPTA) 100-25 MCG/INH AEPB Inhale 1 puff into the lungs daily.    [provider]  Fluticasone-Umeclidin-Vilant 100-62.5-25 MCG/INH AEPB Inhale 1 puff into the lungs daily. 12/25/17   [provider]  furosemide (LASIX) 40 MG tablet Take 1 tablet (40 mg total) by mouth daily. 07/01/18   KEpifanio Lesches MD  HUMALOG 100 UNIT/ML injection Inject 76 Units into the skin daily. In VGO device 08/19/17   [provider]  Insulin Disposable Pump (V-GO 40) KIT Inject into the skin as directed. 08/14/17   [provider]  Insulin Human (INSULIN PUMP) SOLN Inject 1 each into the skin 3 times daily with meals, bedtime and 2 AM. Patient taking differently: Inject 1 each into the skin 3 times daily with meals, bedtime and 2 AM. Pt uses Novolog 09/18/17    PDustin Flock MD  losartan (COZAAR) 100 MG tablet Take 100 mg by mouth daily. 09/12/17   [provider]  metFORMIN (GLUCOPHAGE) 500 MG tablet Take 1,000 mg by mouth 2 (two) times daily with a meal.     [provider]  montelukast (SINGULAIR) 10 MG tablet Take 10 mg by mouth at bedtime.    [provider]  OXYGEN Place 3 L/min into the nose.     [provider]  solifenacin (VESICARE) 10 MG tablet Take 1 tablet (10 mg total) by mouth daily. 07/23/18   Stoioff, SRonda Fairly MD  vitamin B-12 (CYANOCOBALAMIN) 500 MCG tablet Take 500 mcg by mouth daily.    [provider]    Family History Family History  Problem Relation Age of Onset  . Other Mother        unknown medical history  . Other Father        unknown medical history    Social History Social History   Tobacco Use  . Smoking status: Former Smoker    Last attempt to quit:  12/04/1992    Years since quitting: 25.6  . Smokeless tobacco: Never Used  Substance Use Topics  . Alcohol use: No  . Drug use: No     Allergies   Ace inhibitors; Gabapentin; Lisinopril; Lyrica [pregabalin]; and Shrimp [shellfish allergy]   Review of Systems Review of Systems  Constitutional: Positive for fatigue. Negative for chills and fever.  HENT: Negative for congestion and rhinorrhea.   Eyes: Negative for visual disturbance.  Respiratory: Negative for cough, shortness of breath and wheezing.   Cardiovascular: Positive for palpitations. Negative for chest pain and leg swelling.  Gastrointestinal: Negative for abdominal pain, diarrhea, nausea and vomiting.  Genitourinary: Negative for dysuria and flank pain.  Musculoskeletal: Negative for neck pain and neck stiffness.  Skin: Negative for rash and wound.  Allergic/Immunologic: Negative for immunocompromised state.  Neurological: Positive for weakness. Negative for syncope and headaches.  All other systems reviewed and are negative.    Physical  Exam Updated Vital Signs BP 92/65   Pulse (!) 43   Temp 97.7 F (36.5 C) (Oral)   Resp 19   Ht _0  (1.6 m)   Wt 102.5 kg   SpO2 100%   BMI 40.03 kg/m   Physical Exam   ED Treatments / Results  Labs (all labs ordered are listed, but only abnormal results are displayed) Labs Reviewed  CBC WITH DIFFERENTIAL/PLATELET - Abnormal; Notable for the following components:      Result Value   WBC 11.5 (*)    Hemoglobin 11.3 (*)    RDW 16.6 (*)    Neutro Abs 8.2 (*)    All other components within normal limits  COMPREHENSIVE METABOLIC PANEL - Abnormal; Notable for the following components:   Sodium 133 (*)    Glucose, Bld 138 (*)    BUN 54 (*)    Creatinine, Ser 1.91 (*)    Calcium 8.6 (*)    GFR calc non Af Amer 25 (*)    GFR calc Af Amer 29 (*)    All other components within normal limits  BRAIN NATRIURETIC PEPTIDE - Abnormal; Notable for the following components:   B Natriuretic Peptide 826.0 (*)    All other components within normal limits  SARS CORONAVIRUS 2 (HOSPITAL ORDER, [place] [facility])  TROPONIN I  MAGNESIUM    EKG EKG Interpretation  Date/Time:  Monday August 05 2018 10:38:55 EDT Ventricular Rate:  47 PR Interval:    QRS Duration: 74 QT Interval:  466 QTC Calculation: 412 R Axis:   53 Text Interpretation:  Junctional rhythm Nonspecific ST abnormality Abnormal ECG When compared with ECG of 25-Jun-2018 09:56, PREVIOUS ECG IS PRESENT Since last EKG, Junctional rhythm has replaced sinus rhythm Confirmed by Duffy Bruce [phone]) on 08/05/2018 10:52:37 AM   Radiology Dg Chest Portable 1 View  Result Date: 08/05/2018 CLINICAL DATA:  Shortness of breath, edema EXAM: PORTABLE CHEST 1 VIEW COMPARISON:  06/25/2018 FINDINGS: Cardiomegaly. No significant interval change mild, diffuse interstitial pulmonary opacity and pulmonary vascular prominence. No new or focal airspace opacity. IMPRESSION: Cardiomegaly. No significant interval change mild,  diffuse interstitial pulmonary opacity and pulmonary vascular prominence, consistent with mild edema. No new or focal airspace opacity. Electronically Signed   By: Eddie Candle M.D.   On: 08/05/2018 11:36    Procedures Procedures (including critical care time)  Medications Ordered in ED Medications  atropine injection 0.2 mg (0.2 mg Intravenous Given 08/05/18 1306)     Initial Impression / Assessment and Plan /  ED Course  I have reviewed the triage vital signs and the nursing notes.  Pertinent labs & imaging results that were available during my care of the patient were reviewed by me and considered in my medical decision making (see chart for details).        73 year old female here with shortness of breath and general fatigue.  On arrival, patient noted to be in junctional bradycardia, but blood pressure is stable.  She does appear hypervolemic clinically.  Lab work shows significant elevation of BNP as well as acute kidney injury with near doubling of her creatinine.  Interestingly, BUN is elevated in addition, though she is hypervolemic clinically.  She may have decompensated heart failure contributing to ischemic ATN.  Regarding her junctional rhythm, I suspect her sotalol, which is renally cleared, is the likely culprit.  No ischemic changes. CXR is c/w her recurrent CHF. Discussed with Dr. Fletcher Anon of cardiology as she reportedly sees Dr. Rockey Situ of [facility].  They will see the patient to see if she needs temporary pacemaker placement.  Diuresis may be made difficult by her AKI and intermittently soft pressures.  Minimal improvement noted with trial of atropine.   Final Clinical Impressions(s) / ED Diagnoses   Final diagnoses:  Junctional bradycardia  Acute on chronic combined systolic and diastolic congestive heart failure ([place])  AKI (acute kidney injury) [facility])    ED Discharge Orders    None       Duffy Bruce, MD 08/05/18 1336    Duffy Bruce, MD 08/05/18 1336

## 2018-08-05 NOTE — Consult Note (Signed)
Cardiology Consultation:   Patient ID: Karen [name]; [id number]; 23-Apr-1945   Admit date: 08/05/2018 Date of Consult: 08/05/2018  Primary Care Provider: Ricardo Jericho, NP Primary Cardiologist: Rockey Situ   Patient Profile:   Karen [name] is a 73 y.o. 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 Tressia Miners.  History of Present Illness:   [name] was previously seen by [facility] in 2017 with echo at that time showing an EF of 50%, mild LVH, mild MR/TR, mild pulmonary hypertension. More recently, she was admitted to the hospital in 06/2018 with acute on chronic hypoxic respiratory failure felt to be multifactorial including acte on chronic diastolic CHF, COPD, and morbid obesity. Echo showed an EF of 50-55%, normal LV diastolic function, mild thickening of the mitral valve, mild AI, mild AS. With diuresis, symptoms improved. Discharge weight documented at 99.5 kg. She was placed on Lasix 40 mg daily as well as bisoprolol 5 mg. Following her discharge, she did reasonably well. In early 07/2018 she was noted to have gained 7 pounds over the prior 8 days with recommendation to double her Lasix by the [facility]. Outside of the weight gain, she was asymptomatic. She has been obtaining food through [employer]. Over the past week, she began to note increased SOB and lower extremity swelling with stable 2-pillow orthopnea. She reports compliance with Lasix 40 mg daily as well as food though the [employer] program. Her weight has increased from a baseline of ~218 pounds to 225 pounds during this time span. Of note, she was recently seen at an outside urgent care on 5/29 secondary to a recent fall with residual neck pain. She  was treated with Flexeril. Vital signs show a mild bradycardia of 52 bpm at that time. Due to continued SOB, weight gain, and lower extremity swelling she presented to [facility] where she was noted to be bradycardic with a junctional rhythm in the 40s bpm upon arrival with BP ranging from the 70s to low 740C systolic. She denied any dizziness, presyncope, or syncope as well as chest pain or palpitations. Her weight was noted to be 225 pounds in the ED. She was given atropine, though remained bradycardic in the 40s bpm. Labs showed an initial troponin of < 0.03, BNP 826 (prior 150 one month ago), COVID-19 negative, BUN 54, SCr 1.91 with a baseline ~ 0.6-0.9, K+ 5.1, albumin 3.5, AST/ALT normal, WBC 11.5, HGB 11.3, magnesium 2.4. CXR showed cardiomegaly with no significant interval change in mild diffuse interstitial pulmonary opacity with pulmonary vascular edema. EKG showed junctional bradycardia. She is up sitting in a recliner upon my evaluation and is minimally symptomatic with SOB and lower extremity swelling per her report. She denies any dizziness, presyncope, or syncope. No chest pain.   Past Medical History:  Diagnosis Date  . (HFpEF) heart failure with preserved ejection fraction ([place]) 2017   (1) TTE 2017 a. EF 50% b. mild LVH c. mild MR/TR, d. mild LAE e. mild pulmonary HTN  (2) TTE 06/2018 a. EF 50-55%, mild AS with thickening of valve   . Acute on chronic respiratory failure with hypoxia and hypercapnia ([place]) 01/07/2015  . Anemia   . 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 Asthma   .  Cataract   . Chronic kidney disease 08/10/2017  . CKD (chronic kidney disease)   . COPD (chronic obstructive pulmonary disease) ([place])    (1) 06/2018 tobacco use, home 3L oxygen   . Diabetes mellitus without complication (HCC)    (1) A1C 7.7 (06/2018)  . Edema, peripheral 04/20/2014  . History of kidney stones   . Hyperlipidemia   . Hypertension   . Iron deficiency anemia 06/22/2014  . Leucocytosis 10/19/2015  . Overactive bladder    . Primary osteoarthritis of right knee 09/01/2016  . Renal insufficiency   . Sciatica 01/07/2015    Past Surgical History:  Procedure Laterality Date  . APPENDECTOMY    . CESAREAN SECTION     x3  . CHOLECYSTECTOMY    . COLONOSCOPY WITH PROPOFOL N/A 08/28/2017   Procedure: COLONOSCOPY WITH PROPOFOL;  Surgeon: Lucilla Lame, MD;  Location: [facility] ENDOSCOPY;  Service: Endoscopy;  Laterality: N/A;  . COLONOSCOPY WITH PROPOFOL N/A 08/29/2017   Procedure: COLONOSCOPY WITH PROPOFOL;  Surgeon: Lucilla Lame, MD;  Location: [facility] ENDOSCOPY;  Service: Endoscopy;  Laterality: N/A;  . CYSTOSCOPY W/ URETERAL STENT PLACEMENT Right 09/15/2017   Procedure: CYSTOSCOPY WITH RETROGRADE PYELOGRAM/URETERAL STENT PLACEMENT;  Surgeon: Cleon Gustin, MD;  Location: [facility] ORS;  Service: Urology;  Laterality: Right;  . CYSTOSCOPY/URETEROSCOPY/HOLMIUM LASER/STENT PLACEMENT Right 10/09/2017   Procedure: CYSTOSCOPY/URETEROSCOPY/HOLMIUM LASER/STENT PLACEMENT;  Surgeon: Abbie Sons, MD;  Location: [facility] ORS;  Service: Urology;  Laterality: Right;  right Stent exchange  . EYE SURGERY       Home Meds: Prior to Admission medications   Medication Sig Start Date End Date Taking? Authorizing Provider  aspirin 81 MG chewable tablet Chew 81 mg by mouth daily. On hold    [provider]  atorvastatin (LIPITOR) 10 MG tablet Take 1 tablet by mouth daily.    [provider]  bisoprolol (ZEBETA) 5 MG tablet Take 1 tablet (5 mg total) by mouth daily. 07/30/18   Alisa Graff, FNP  canagliflozin (INVOKANA) 100 MG TABS tablet Take 1 tablet by mouth daily.    [provider]  cyclobenzaprine (FLEXERIL) 10 MG tablet Take 1 tablet (10 mg total) by mouth 3 (three) times daily as needed for muscle spasms. 08/02/18   Norval Gable, MD  estradiol (ESTRACE VAGINAL) 0.1 MG/GM vaginal cream Apply 0.21m (pea-sized amount)  just inside the vaginal introitus with a finger-tip on Monday, Wednesday and Friday  nights. Patient taking differently: daily. . 08/31/17   MNori Riis PA-C  Ferrous Sulfate (IRON) 325 (65 Fe) MG TABS Take 1 tablet by mouth daily. 09/14/17   [provider]  fluticasone furoate-vilanterol (BREO ELLIPTA) 100-25 MCG/INH AEPB Inhale 1 puff into the lungs daily.    [provider]  Fluticasone-Umeclidin-Vilant 100-62.5-25 MCG/INH AEPB Inhale 1 puff into the lungs daily. 12/25/17   [provider]  furosemide (LASIX) 40 MG tablet Take 1 tablet (40 mg total) by mouth daily. 07/01/18   KEpifanio Lesches MD  HUMALOG 100 UNIT/ML injection Inject 76 Units into the skin daily. In VGO device 08/19/17   [provider]  Insulin Disposable Pump (V-GO 40) KIT Inject into the skin as directed. 08/14/17   [provider]  Insulin Human (INSULIN PUMP) SOLN Inject 1 each into the skin 3 times daily with meals, bedtime and 2 AM. Patient taking differently: Inject 1 each into the skin 3 times daily with meals, bedtime and 2 AM. Pt uses Novolog 09/18/17   PDustin Flock MD  losartan (COZAAR)  100 MG tablet Take 100 mg by mouth daily. 09/12/17   [provider]  metFORMIN (GLUCOPHAGE) 500 MG tablet Take 1,000 mg by mouth 2 (two) times daily with a meal.     [provider]  montelukast (SINGULAIR) 10 MG tablet Take 10 mg by mouth at bedtime.    [provider]  OXYGEN Place 3 L/min into the nose.     [provider]  solifenacin (VESICARE) 10 MG tablet Take 1 tablet (10 mg total) by mouth daily. 07/23/18   Stoioff, Ronda Fairly, MD  vitamin B-12 (CYANOCOBALAMIN) 500 MCG tablet Take 500 mcg by mouth daily.    [provider]    Inpatient Medications: Scheduled Meds:  Continuous Infusions:  PRN Meds:   Allergies:   Allergies  Allergen Reactions  . Ace Inhibitors   . Gabapentin Hives  . Lisinopril   . Lyrica [Pregabalin]   . Shrimp [Shellfish Allergy] Swelling    Swelling of the lips    Social  History:   Social History   Socioeconomic History  . Marital status: Married    Spouse name: Not on file  . Number of children: Not on file  . Years of education: Not on file  . Highest education level: Not on file  Occupational History  . Not on file  Social Needs  . Financial resource strain: Not on file  . Food insecurity:    Worry: Not on file    Inability: Not on file  . Transportation needs:    Medical: Not on file    Non-medical: Not on file  Tobacco Use  . Smoking status: Former Smoker    Last attempt to quit: 12/04/1992    Years since quitting: 25.6  . Smokeless tobacco: Never Used  Substance and Sexual Activity  . Alcohol use: No  . Drug use: No  . Sexual activity: Not on file  Lifestyle  . Physical activity:    Days per week: Not on file    Minutes per session: Not on file  . Stress: Not on file  Relationships  . Social connections:    Talks on phone: Not on file    Gets together: Not on file    Attends religious service: Not on file    Active member of club or organization: Not on file    Attends meetings of clubs or organizations: Not on file    Relationship status: Not on file  . Intimate partner violence:    Fear of current or ex partner: Not on file    Emotionally abused: Not on file    Physically abused: Not on file    Forced sexual activity: Not on file  Other Topics Concern  . Not on file  Social History Narrative  . Not on file     Family History:   Family History  Problem Relation Age of Onset  . Other Mother        unknown medical history  . Other Father        unknown medical history    ROS:  Review of Systems  Constitutional: Positive for malaise/fatigue. Negative for chills, diaphoresis, fever and weight loss.  HENT: Negative for congestion.   Eyes: Negative for discharge and redness.  Respiratory: Positive for cough and shortness of breath. Negative for hemoptysis, sputum production and wheezing.   Cardiovascular: Positive  for orthopnea and leg swelling. Negative for chest pain, palpitations, claudication and PND.       Stable 2-pillow  orthopnea   Gastrointestinal: Negative for abdominal pain, blood in stool, heartburn, melena, nausea and vomiting.  Genitourinary: Negative for hematuria.  Musculoskeletal: Negative for falls and myalgias.  Skin: Negative for rash.  Neurological: Positive for weakness. Negative for dizziness, tingling, tremors, sensory change, speech change, focal weakness and loss of consciousness.  Endo/Heme/Allergies: Does not bruise/bleed easily.  Psychiatric/Behavioral: Negative for substance abuse. The patient is not nervous/anxious.   All other systems reviewed and are negative.     Physical Exam/Data:   Vitals:   08/05/18 1057 08/05/18 1132 08/05/18 1200 08/05/18 1300  BP: 99/72 (!) 90/58 (!) 106/52 92/65  Pulse: (!) 45 (!) 44 (!) 43 (!) 43  Resp: '18 13  19  ' Temp:      TempSrc:      SpO2: 100% 99% 100% 100%  Weight:      Height:       No intake or output data in the 24 hours ending 08/05/18 1333 Filed Weights   08/05/18 1034  Weight: 102.5 kg   Body mass index is 40.03 kg/m.   Physical Exam: General: Well developed, well nourished, in no acute distress. Head: Normocephalic, atraumatic, sclera non-icteric, no xanthomas, nares without discharge.  Neck: Negative for carotid bruits. JVD difficult to assess secondary to body habitus. Lungs: Diminished breath sounds along bilateral bases. Breathing is unlabored. Heart: Bradycardic with S1 S2. II/VI systolic murmur along the LLSB and apex, no rubs, or gallops appreciated. Abdomen: Soft, non-tender, non-distended with normoactive bowel sounds. No hepatomegaly. No rebound/guarding. No obvious abdominal masses. Msk:  Strength and tone appear normal for age. Extremities: No clubbing or cyanosis. 1+ bilateral lower extremity edema. Distal pedal pulses are 2+ and equal bilaterally. Neuro: Alert and oriented X 3. No facial asymmetry.  No focal deficit. Moves all extremities spontaneously. Psych:  Responds to questions appropriately with a normal affect.   EKG:  The EKG was personally reviewed and demonstrates: junctional bradycardia, 47 bpm, no acute st/ changes Telemetry:  Telemetry was personally reviewed and demonstrates: junctional bradycardia, 40s bpm  Weights: Filed Weights   08/05/18 1034  Weight: 102.5 kg    Relevant CV Studies: 2D Echo 06/25/2018: 1. The left ventricle has low normal systolic function, with an ejection fraction of 50-55%. The cavity size was normal. Left ventricular diastolic parameters were normal.  2. The right ventricle has normal systolic function. The cavity was normal. There is no increase in right ventricular wall thickness.  3. Mild thickening of the mitral valve leaflet.  4. The aortic valve is tricuspid. Moderate thickening of the aortic valve. Mild calcification of the aortic valve. Aortic valve regurgitation is trivial by color flow Doppler. Mild stenosis of the aortic valve.  Laboratory Data:  Chemistry Recent Labs  Lab 08/05/18 1117  NA 133*  K 5.1  CL 98  CO2 22  GLUCOSE 138*  BUN 54*  CREATININE 1.91*  CALCIUM 8.6*  GFRNONAA 25*  GFRAA 29*  ANIONGAP 13    Recent Labs  Lab 08/05/18 1117  PROT 6.6  ALBUMIN 3.5  AST 19  ALT 15  ALKPHOS 72  BILITOT 0.4   Hematology Recent Labs  Lab 08/05/18 1117  WBC 11.5*  RBC 4.13  HGB 11.3*  HCT 37.4  MCV 90.6  MCH 27.4  MCHC 30.2  RDW 16.6*  PLT 285   Cardiac Enzymes Recent Labs  Lab 08/05/18 1117  TROPONINI <0.03   No results for input(s): TROPIPOC in the last 168 hours.  BNP Recent Labs  Lab  08/05/18 1118  BNP 826.0*    DDimer No results for input(s): DDIMER in the last 168 hours.  Radiology/Studies:  Dg Chest Portable 1 View  Result Date: 08/05/2018 IMPRESSION: Cardiomegaly. No significant interval change mild, diffuse interstitial pulmonary opacity and pulmonary vascular prominence,  consistent with mild edema. No new or focal airspace opacity. Electronically Signed   By: Eddie Candle M.D.   On: 08/05/2018 11:36    Assessment and Plan:   1. Acute on chronic HFpEF/pulmonary hypertension: -She is at least 7-8 pounds, possibly more, up from her baseline -She will need IV diuresis with augmentation of inotropic support as outlined below -Check stat echo, if she is found to have a newly reduced LVSF would change dopamine to dobutamine, as well ischemic evaluation   2. Junctional bradycardia: -Denies any dizziness, presyncope, or syncope -Last dose of bisoprolol 5 mg in the morning of 5/31 -Hold bisoprolol -Status post atropine in the ED without significant improvement in heart rate -Start dopamine after discussion with MD -If heart rate remains bradycardic along with hypotensive BP, she may require venous temp wire -Following washout of bisoprolol, we will need to assess for appropriate chronotropic/BP response to further assess if PPM is needed  -Possibly playing a role in #1 and #3 -Consider gentle treatment of her high-normal potassium  -Pacer pads in place -Atropine prn -Check TSH -Check stat echo as above  3. Hypotension: -Cannot exclude cardiogenic etiology -Start dopamine gtt as above per MD -Possibly in the setting of #2 -If EF is found to be newly reduced would change to dobutamine   4. ARF: -Possibly in the setting of the above -Continue to monitor with addition of inotropic support followed by diuresis  -Monitor potassium carefully as above  5. Morbid obesity: -Likely contributing to the above -Weight loss advised   6. Leukocytosis: -No obvious sign of infection at this time -Consider cultures    For questions or updates, please contact [place] Please consult [email or web address] for contact info under Cardiology/STEMI.   Signed, Christell Faith, PA-C [place] Pager: [phone] 08/05/2018, 1:33 PM

## 2018-08-05 NOTE — Progress Notes (Signed)
*  PRELIMINARY RESULTS* Echocardiogram 2D Echocardiogram has been performed.  Sherrie Sport 08/05/2018, 2:27 PM

## 2018-08-06 DIAGNOSIS — N179 Acute kidney failure, unspecified: Secondary | ICD-10-CM

## 2018-08-06 DIAGNOSIS — R001 Bradycardia, unspecified: Secondary | ICD-10-CM

## 2018-08-06 DIAGNOSIS — I5033 Acute on chronic diastolic (congestive) heart failure: Secondary | ICD-10-CM

## 2018-08-06 LAB — CBC
HCT: 36.5 % (ref 36.0–46.0)
Hemoglobin: 11.5 g/dL — ABNORMAL LOW (ref 12.0–15.0)
MCH: 27.5 pg (ref 26.0–34.0)
MCHC: 31.5 g/dL (ref 30.0–36.0)
MCV: 87.3 fL (ref 80.0–100.0)
Platelets: 284 10*3/uL (ref 150–400)
RBC: 4.18 MIL/uL (ref 3.87–5.11)
RDW: 15.9 % — ABNORMAL HIGH (ref 11.5–15.5)
WBC: 14.9 10*3/uL — ABNORMAL HIGH (ref 4.0–10.5)
nRBC: 0 % (ref 0.0–0.2)

## 2018-08-06 LAB — BASIC METABOLIC PANEL
Anion gap: 12 (ref 5–15)
BUN: 62 mg/dL — ABNORMAL HIGH (ref 8–23)
CO2: 24 mmol/L (ref 22–32)
Calcium: 8.7 mg/dL — ABNORMAL LOW (ref 8.9–10.3)
Chloride: 95 mmol/L — ABNORMAL LOW (ref 98–111)
Creatinine, Ser: 1.81 mg/dL — ABNORMAL HIGH (ref 0.44–1.00)
GFR calc Af Amer: 31 mL/min — ABNORMAL LOW (ref >60)
GFR calc non Af Amer: 27 mL/min — ABNORMAL LOW (ref >60)
Glucose, Bld: 262 mg/dL — ABNORMAL HIGH (ref 70–99)
Potassium: 5.6 mmol/L — ABNORMAL HIGH (ref 3.5–5.1)
Sodium: 131 mmol/L — ABNORMAL LOW (ref 135–145)

## 2018-08-06 LAB — GLUCOSE, CAPILLARY
Glucose-Capillary: 230 mg/dL — ABNORMAL HIGH (ref 70–99)
Glucose-Capillary: 219 mg/dL — ABNORMAL HIGH (ref 70–99)
Glucose-Capillary: 157 mg/dL — ABNORMAL HIGH (ref 70–99)

## 2018-08-06 LAB — POTASSIUM: Potassium: 6 mmol/L — ABNORMAL HIGH (ref 3.5–5.1)

## 2018-08-06 MED ORDER — INSULIN PUMP
SUBCUTANEOUS | Status: DC
  Administered 2018-08-07 – 2018-08-08 (×2): via SUBCUTANEOUS
  Filled 2018-08-06: qty 1

## 2018-08-06 MED ORDER — SODIUM ZIRCONIUM CYCLOSILICATE 5 G PO PACK: 10.0000 g | PACK | Freq: Once | ORAL | Status: DC

## 2018-08-06 MED ORDER — FUROSEMIDE 10 MG/ML IJ SOLN
40.0000 mg | Freq: Once | INTRAMUSCULAR | Status: AC
  Administered 2018-08-06: 19:00:00 40 mg via INTRAVENOUS
  Filled 2018-08-06: qty 4

## 2018-08-06 NOTE — Progress Notes (Signed)
[facility] [place] NAME: Karen [name]    MR#:  [id number]  DATE OF BIRTH:  12-25-1945  SUBJECTIVE:  CHIEF COMPLAINT:   Chief Complaint  Patient presents with  . Edema  . Shortness of Breath   -Feels very nauseous today.  Remains on dopamine drip -Less urine output overnight  REVIEW OF SYSTEMS:  Review of Systems  Constitutional: Negative for chills and fever.  HENT: Negative for congestion, ear discharge, hearing loss and nosebleeds.   Eyes: Negative for blurred vision and double vision.  Respiratory: Negative for cough, shortness of breath and wheezing.   Cardiovascular: Positive for leg swelling. Negative for chest pain and palpitations.  Gastrointestinal: Positive for nausea. Negative for abdominal pain, constipation, diarrhea and vomiting.  Genitourinary: Negative for dysuria.  Musculoskeletal: Negative for myalgias.  Neurological: Negative for dizziness, focal weakness, seizures, weakness and headaches.  Psychiatric/Behavioral: Negative for depression.    DRUG ALLERGIES:   Allergies  Allergen Reactions  . Ace Inhibitors   . Gabapentin Hives  . Lisinopril   . Lyrica [Pregabalin]   . Shrimp [Shellfish Allergy] Swelling    Swelling of the lips    VITALS:  Blood pressure 121/62, pulse 62, temperature 97.6 F (36.4 C), temperature source Oral, resp. rate (!) 33, height 5\' 3"  (1.6 m), weight 91.4 kg, SpO2 93 %.  PHYSICAL EXAMINATION:  Physical Exam  GENERAL:  73 y.o.-year-old obese patient sitting in the bed, feels miserable due to nausea EYES: Pupils equal, round, reactive to light and accommodation. No scleral icterus. Extraocular muscles intact.  HEENT: Head atraumatic, normocephalic. Oropharynx and nasopharynx clear.  NECK:  Supple, no jugular venous distention. No thyroid enlargement, no tenderness.  LUNGS: Normal breath sounds bilaterally, no wheezing, rhonchi or crepitation. No use of accessory muscles of  respiration.  Decreased bibasilar breath sounds with fine rales at the bases. CARDIOVASCULAR: S1, S2 normal. No murmurs, rubs, or gallops.  ABDOMEN: Soft, nontender, nondistended. Bowel sounds present. No organomegaly or mass.  EXTREMITIES: No cyanosis, or clubbing.   Improved lower extremity edema noted NEUROLOGIC: Cranial nerves II through XII are intact. Muscle strength 5/5 in all extremities. Sensation intact. Gait not checked.  PSYCHIATRIC: The patient is alert and oriented x 3.  SKIN: No obvious rash, lesion, or ulcer.    LABORATORY PANEL:   CBC Recent Labs  Lab 08/06/18 0413  WBC 14.9*  HGB 11.5*  HCT 36.5  PLT 284   ------------------------------------------------------------------------------------------------------------------  Chemistries  Recent Labs  Lab 08/05/18 1117 08/06/18 0413  NA 133* 131*  K 5.1 5.6*  CL 98 95*  CO2 22 24  GLUCOSE 138* 262*  BUN 54* 62*  CREATININE 1.91* 1.81*  CALCIUM 8.6* 8.7*  MG 2.4  --   AST 19  --   ALT 15  --   ALKPHOS 72  --   BILITOT 0.4  --    ------------------------------------------------------------------------------------------------------------------  Cardiac Enzymes Recent Labs  Lab 08/05/18 1117  TROPONINI <0.03   ------------------------------------------------------------------------------------------------------------------  RADIOLOGY:  Dg Chest Portable 1 View  Result Date: 08/05/2018 CLINICAL DATA:  Shortness of breath, edema EXAM: PORTABLE CHEST 1 VIEW COMPARISON:  06/25/2018 FINDINGS: Cardiomegaly. No significant interval change mild, diffuse interstitial pulmonary opacity and pulmonary vascular prominence. No new or focal airspace opacity. IMPRESSION: Cardiomegaly. No significant interval change mild, diffuse interstitial pulmonary opacity and pulmonary vascular prominence, consistent with mild edema. No new or focal airspace opacity. Electronically Signed   By: Dorna Bloom.D.  On: 08/05/2018 11:36     EKG:   Orders placed or performed during the hospital encounter of 08/05/18  . EKG 12-Lead  . EKG 12-Lead  . EKG 12-Lead  . EKG 12-Lead    ASSESSMENT AND PLAN:   Karen [name]  is a 73 y.o. female with a known history of chronic respiratory failure secondary to COPD on 3 L home oxygen, diastolic CHF, hypertension, anemia, osteoarthritis presents to hospital secondary to worsening weight gain and shortness of breath.  1.  Junctional bradycardia- cardiology.    Asymptomatic without dizziness, syncope or presyncopal episodes.-Hold beta-blockers.  She was started recently on bisoprolol. -Cardiology consult appreciated.  Received a dose of atropine in the ED -remains on dopamine drip.  Heart rate in the 60s today. -Correct electrolytes.  Echocardiogram with LVH, normal EF and elevated pulmonary pressures..  Temporary pacer pads in place.  2.  Acute on chronic diastolic heart failure-hypotensive, will hold Lasix at this time due to hypotension.  On dopamine drip.  Echocardiogram done bedside showing EF of 50 to 55% but has moderate to severe pulmonary hypertension -Continue daily weights and input and output monitoring -Appreciate cardiology consult  3.  Acute renal failure-likely ATN secondary to hypotension. -Appears to be worsening at this time. -Appreciate nephrology consult.  4.  Hyperkalemia-hold losartan.  1 dose of Lokelma given. Continue to monitor  5.  Chronic respiratory failure secondary to COPD-stable, on 3 L chronic home oxygen.  Continue inhalers.  6.  Diabetes mellitus-on insulin pump.  Appreciate diabetes coordinator input.  Continue insulin pump.  According to diabetes coordinator notes, delivers 40 units of basal insulin and she takes pre-meal clicks  7.  DVT prophylaxis-on subcu heparin  Independent at baseline, ambulates with a walker     All the records are reviewed and case discussed with Care Management/Social Workerr. Management plans discussed  with the patient, family and they are in agreement.  CODE STATUS: Full code  TOTAL TIME TAKING CARE OF THIS PATIENT: 39 minutes.   POSSIBLE D/C IN 2 DAYS, DEPENDING ON CLINICAL CONDITION.   Gladstone Lighter M.D on 08/06/2018 at 10:43 AM  Between 7am to 6pm - Pager - [phone]  After 6pm go to [email or web address] - password [email or web address] [place] [facility]  Office  [phone]  CC: Primary care physician; Ricardo Jericho, NP

## 2018-08-06 NOTE — Progress Notes (Signed)
Progress Note  Patient Name: Karen [name] Date of Encounter: 08/06/2018  Primary Cardiologist: Ida Rogue, MD   Subjective   Reported she was very uncomfortable and felt she would feel better if allowed out of the bed. Expressed a desire to sit in her chair. Continues to report SOB and that feels volume status not at baseline. Denies sx of presyncope or recent LOC since admission.   Inpatient Medications    Scheduled Meds: . aspirin  81 mg Oral Daily  . atorvastatin  10 mg Oral Daily  . darifenacin  15 mg Oral Daily  . ferrous sulfate  325 mg Oral Daily  . fluticasone furoate-vilanterol  1 puff Inhalation Daily   And  . umeclidinium bromide  1 puff Inhalation Daily  . heparin  5,000 Units Subcutaneous Q8H  . insulin aspart  0-5 Units Subcutaneous QHS  . insulin aspart  0-9 Units Subcutaneous TID WC  . mouth rinse  15 mL Mouth Rinse BID  . metFORMIN  1,000 mg Oral BID WC  . montelukast  10 mg Oral QHS  . vitamin B-12  500 mcg Oral Daily   Continuous Infusions: . DOPamine 10 mcg/kg/min (08/06/18 0222)   PRN Meds: acetaminophen **OR** acetaminophen, ondansetron **OR** ondansetron (ZOFRAN) IV   Vital Signs    Vitals:   08/06/18 0000 08/06/18 0400 08/06/18 0500 08/06/18 0600  BP: (!) 110/95 (!) 117/49 (!) 115/48 (!) 124/58  Pulse: 64 (!) 53 (!) 59 64  Resp: (!) 28 17 (!) 30 (!) 28  Temp: 98.4 F (36.9 C) 97.6 F (36.4 C)    TempSrc: Oral Oral    SpO2: 93% (!) 88% 91% 92%  Weight:      Height:        Intake/Output Summary (Last 24 hours) at 08/06/2018 0736 Last data filed at 08/06/2018 0400 Gross per 24 hour  Intake 42.87 ml  Output 400 ml  Net -357.13 ml   Filed Weights   08/05/18 1034 08/05/18 1547  Weight: 102.5 kg 91.4 kg    Telemetry    Junctional rhythm 60s-80s and improved from yesterday, PVCs - Personally Reviewed  ECG    No new tracings - Personally Reviewed  Physical Exam   GEN: No acute distress though appears somewhat  uncomfortable Neck: JVD difficult to assess d/t body habitus Cardiac: RRR, 2/6 systolic murmur, rubs, or gallops.  Respiratory: Bibasilar reduced breath sounds. Remains on [place] oxygen GI: Soft, nontender, non-distended  MS: 1+ bilateral lower extremity edema; No deformity. Neuro:  Nonfocal  Psych: Normal affect   Labs    Chemistry Recent Labs  Lab 08/05/18 1117 08/06/18 0413  NA 133* 131*  K 5.1 5.6*  CL 98 95*  CO2 22 24  GLUCOSE 138* 262*  BUN 54* 62*  CREATININE 1.91* 1.81*  CALCIUM 8.6* 8.7*  PROT 6.6  --   ALBUMIN 3.5  --   AST 19  --   ALT 15  --   ALKPHOS 72  --   BILITOT 0.4  --   GFRNONAA 25* 27*  GFRAA 29* 31*  ANIONGAP 13 12     Hematology Recent Labs  Lab 08/05/18 1117 08/06/18 0413  WBC 11.5* 14.9*  RBC 4.13 4.18  HGB 11.3* 11.5*  HCT 37.4 36.5  MCV 90.6 87.3  MCH 27.4 27.5  MCHC 30.2 31.5  RDW 16.6* 15.9*  PLT 285 284    Cardiac Enzymes Recent Labs  Lab 08/05/18 1117  TROPONINI <0.03   No results for  input(s): TROPIPOC in the last 168 hours.   BNP Recent Labs  Lab 08/05/18 1118 08/05/18 1624  BNP 826.0* 831.0*     DDimer No results for input(s): DDIMER in the last 168 hours.   Radiology    Dg Chest Portable 1 View  Result Date: 08/05/2018 CLINICAL DATA:  Shortness of breath, edema EXAM: PORTABLE CHEST 1 VIEW COMPARISON:  06/25/2018 FINDINGS: Cardiomegaly. No significant interval change mild, diffuse interstitial pulmonary opacity and pulmonary vascular prominence. No new or focal airspace opacity. IMPRESSION: Cardiomegaly. No significant interval change mild, diffuse interstitial pulmonary opacity and pulmonary vascular prominence, consistent with mild edema. No new or focal airspace opacity. Electronically Signed   By: Eddie Candle M.D.   On: 08/05/2018 11:36    Cardiac Studies   08/05/2018 Echo  1. The left ventricle has low normal systolic function, with an ejection fraction of 50-55%. The cavity size was normal. There is mild  concentric left ventricular hypertrophy. Left ventricular diastolic Doppler parameters are indeterminate.  2. The right ventricle has low normal systolic function. The cavity was mildly enlarged. There is no increase in right ventricular wall thickness. Right ventricular systolic pressure is moderately elevated with an estimated pressure of 60.4 mmHg.  3. Left atrial size was mildly dilated.  4. Right atrial size was mildly dilated.  5. The mitral valve is grossly normal.  6. The tricuspid valve is grossly normal. Tricuspid valve regurgitation is mild-moderate.  7. The aortic valve is grossly normal. Mild thickening of the aortic valve. Mild calcification of the aortic valve. Aortic valve regurgitation was not assessed by color flow Doppler. Mild stenosis of the aortic valve.  8. The inferior vena cava was dilated in size with <50% respiratory variability.  Patient Profile     73 y.o. 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  Assessment & Plan   Acute on chronic HFpEF / pulmonary HTN --Patient reporting not yet at her baseline with significant shortness of breath.  Still volume overloaded on exam. BNP at admission 800. No current IV or po diuresis. --Echocardiogram done at bedside showed low normal EF 50 to 55% (unchanged from previous echo) with mildly dilated right ventricle.  Diastolic function could not be evaluated but evidence of moderate to severe pulmonary hypertension and severely dilated and nonreactive IVC.  --Worsening HFpEF with volume overload likely in the setting of severe pulmonary HTN, bradycardia, and hypotension with suspicion for cardiorenal syndrome as below.  --No current IV lasix with previous note  indicating recommendation for IV diuresis with titration as needed for volume overload. As previously noted, hope was that renal function would improve with improved volume status, as well as improved renal perfusion on dopamine. Continue to monitor daily weights, strict I's/O's. Output low so far with net negative -357.1 in the setting of no IV or po diuresis this admission (on review of order history).   --Continue to monitor renal function with daily BMET. Cr improved overnight with slight worsening of BUN and, as below, potassium 6.0 with nephrology consulted.    --Wean dopamine as tolerated now that 48h s/p BB washout. Will also need to assess chronotropic and BP response to weaning off dopamine as tolerated with no current indication for pacemaker, though she remains on pacer pads at this time.  Hyperkalemia --6/2 K of 6.0. --Check Mg --Daily BMET as above --Continue to hold ARB --Nephrology consulted  Junctional bradycardia --No current feeling of fast HR, presyncope, or recent LOC since admission. --Started on bisoprolol in April with bradycardia at admission (last dose 5/31 in AM). BB held with washout showing improved HR. S/p atropine in ED and dopamine started yesterday d/t bradycardia and hypotension. Improved HR and SBP today. --Continue to hold BB, ARB. --No current indication for temporary pacemaker, given improved rates and BP on telemetry. Still on pacer pads.  Recommend assess chronotropic and BP response to weaning dopamine as tolerated given s/p 48h washout of BB at this time.  Hypotension - Bedside echo as above does not show newly reduced EF. Improved BP today s/p dopamine and BB washout but with softer BP present earlier in the AM. Continue to monitor.  AKI/ARF -Baseline Cr 0.64 06/28/2018.  --Consider cardiorenal syndrome as above given her significant volume overload.  --Hyperkalemia on most recent BMET. --Further recommendations per nephrology.  Morbid obesity  -  Lifestyle changes recommended  Hypothyroid -TSH mildly elevated at admission.  Consider hypothyroidism as contributing to some of her worsening heart failure.  Leukocytosis - Continue as contributing to current SOB   For questions or updates, please contact [place] Please consult [email or web address] for contact info under        Signed, Arvil Chaco, PA-C  08/06/2018, 7:36 AM

## 2018-08-06 NOTE — Progress Notes (Signed)
   CHIEF COMPLAINT:   Chief Complaint  Patient presents with  . Edema  . Shortness of Breath    Subjective  Admitted for Symptomatic bradycardia Plan for pacemaker placement Has COPD on oxygen theray H/o CHF  presented with HR was in 30's with SOB Placed on dopamine infusion      Review of Systems:  Gen:  Denies  fever, sweats, chills weigh loss  HEENT: Denies blurred vision, double vision, ear pain, eye pain, hearing loss, nose bleeds, sore throat Cardiac:  No dizziness, chest pain or heaviness, chest tightness,edema, No JVD Resp:   No cough, -sputum production, +shortness of breath,-wheezing, -hemoptysis,  Gi: Denies swallowing difficulty, stomach pain, nausea or vomiting, diarrhea, constipation, bowel incontinence Gu:  Denies bladder incontinence, burning urine Ext:   Denies Joint pain, stiffness or swelling Skin: Denies  skin rash, easy bruising or bleeding or hives Endoc:  Denies polyuria, polydipsia , polyphagia or weight change Psych:   Denies depression, insomnia or hallucinations  Other:  All other systems negative   Objective   Examination:  General exam: Appears calm and comfortable  Respiratory system: Clear to auscultation. Respiratory effort normal. HEENT: [place]/AT, PERRLA, no thrush, no stridor. Cardiovascular system: S1 & S2 heard, RRR. No JVD, murmurs, rubs, gallops or clicks. No pedal edema. Gastrointestinal system: Abdomen is nondistended, soft and nontender. No organomegaly or masses felt. Normal bowel sounds heard. Central nervous system: Alert and oriented. No focal neurological deficits. Extremities: Symmetric 5 x 5 power. Skin: No rashes, lesions or ulcers Psychiatry: Judgement and insight appear normal. Mood & affect appropriate.   VITALS:  height is 5\' 3"  (1.6 m) and weight is 91.4 kg. Her oral temperature is 97.6 F (36.4 C). Her blood pressure is 121/62 and her pulse is 62. Her respiration is 33 (abnormal) and oxygen saturation is 93%.   I  personally reviewed Labs under Results section.  Radiology Reports Dg Chest Portable 1 View  Result Date: 08/05/2018 CLINICAL DATA:  Shortness of breath, edema EXAM: PORTABLE CHEST 1 VIEW COMPARISON:  06/25/2018 FINDINGS: Cardiomegaly. No significant interval change mild, diffuse interstitial pulmonary opacity and pulmonary vascular prominence. No new or focal airspace opacity. IMPRESSION: Cardiomegaly. No significant interval change mild, diffuse interstitial pulmonary opacity and pulmonary vascular prominence, consistent with mild edema. No new or focal airspace opacity. Electronically Signed   By: Eddie Candle M.D.   On: 08/05/2018 11:36       Assessment/Plan:  Symptomatic bradycardia Plan for pacemaker placement Follow up cardiology recs Wean dopamine as tolerated  SD status  Corrin Parker, M.D.  [facility]  Medical Director [place] Director [facility] Cardio-Pulmonary Department

## 2018-08-06 NOTE — Progress Notes (Addendum)
Inpatient Diabetes Program Recommendations  AACE/ADA: New Consensus Statement on Inpatient Glycemic Control  Target Ranges:  Prepandial:   less than 140 mg/dL      Peak postprandial:   less than 180 mg/dL (1-2 hours)      Critically ill patients:  140 - 180 mg/dL   Results for Karen [name], Karen [name] (MRN [id number]) as of 08/06/2018 09:45  Ref. Range 08/05/2018 15:35 08/06/2018 07:42  Glucose-Capillary Latest Ref Range: 70 - 99 mg/dL 98 230 (H)   Results for Karen [name], Karen [name] (MRN [id number]) as of 08/06/2018 09:45  Ref. Range 06/25/2018 15:21  Hemoglobin A1C Latest Ref Range: 4.8 - 5.6 % 7.7 (H)   Review of Glycemic Control  Diabetes history: DM2  Outpatient Diabetes medications: Invokana 100 mg daily, Metformin 1000 mg BID, V-Go 40 insulin pump with Novolog (V-Go 40 delivers 40 units of basal insulin over 24H period, pt uses 2 clicks for small meals, 3 clicks for medium meal, 4 clicks for large meal, extra click if blood sugar over 170 before your meals, take 6 clicks if blood sugar is over 250  Current orders for Inpatient glycemic control: Novolog 0-9 units TID with meals, Novolog 0-5 units QHS  Inpatient Diabetes Program Recommendations:   Insulin Pump: Patient has on V-Go 40 insulin pump. Please order Insulin Pump order set with insulin pump frequency of ACHS & 2am.  NOTE: In reviewing chart noted patient uses an insulin pump as an outpatient. Sent chat to Zadie Rhine, RN to inquire if patient has on an insulin pump at this time. RN reports patient does have on her insulin pump and just changed it out about 20 minutes ago. Asked that insulin pump order set be ordered by MD and RN will discuss with MD.  Per chart, noted patient is followed by Dr. Gabriel Carina and Renae Gloss, NP for DM management and was last seen on 04/04/18. Per office note on 04/04/18, patient uses V-Go 40 insulin pump (delivers 40 units of basal insulin over 24 hour period) and takes 3-5 clicks with meals (each click delivers 2 units of  insulin), and takes extra clicks if glucose is elevated.   Addendum 08/06/18@12 :15-Talked with patient regarding DM and insulin pump. Patient verifies that she uses V-Go 40 insulin pump and she takes additional clicks to cover meals (3-5 clicks depending on meal size). Patient also confirms that she uses additional clicks if glucose is running high. Patient states that at home her glucose is usually under 200 mg/dl and runs well. Informed patient that she has a Novolog correction scale ordered while inpatient so the RN will be checking glucose and administering Novolog via syringe for correction of glucose if needed. Patient states that she has insulin pump supplies at bedside to use when changing out V-Go every day (will need to change Q24H).  Patient verbalized understanding of information and states she has no questions at this time related to DM. Still no orders for insulin pump order set. Sent chat message to RN to ask the doctor again about going ahead and placing orders for insulin pump order set.     Thanks, Barnie Alderman, RN, MSN, CDE Diabetes Coordinator Inpatient Diabetes Program [phone] (Team Pager from 8am to 5pm)

## 2018-08-06 NOTE — Plan of Care (Signed)
Pt in junctional rhythm; unsteady on feet; high risk for fall.

## 2018-08-06 NOTE — Consult Note (Signed)
[address] [place], [place] [address] Phone [phone]. FAx [phone]  Date: 08/06/2018                  Patient Name:  Karen [name]  MRN: [id number]  DOB: January 08, 1946  Age / Sex: 73 y.o., female         PCP: Karen Jericho, NP                 Service Requesting Consult: IM/ Gladstone Lighter, MD                 Reason for Consult: ARF            History of Present Illness: Patient is a 73 y.o. female with medical problems of chronic diastolic congestive heart failure, pulmonary hypertension, chronic respiratory failure on 3 L home oxygen, COPD hyperlipidemia, calcium oxalate kidney stones, who was admitted to [facility] on 08/05/2018.  Patient presented to the emergency on room for/Monday for complaints of increasing shortness of breath and edema. In the emergency room she was noted to be bradycardic with heart rate in the 40s and low blood pressure.  EKG showed junctional bradycardia.  Patient was evaluated by cardiologist.  Echo showed EF of 50 to 55% with mildly dilated right ventricle.  Patient did have evidence of moderate to severe pulmonary hypertension  Results for Karen, [name] (MRN [id number]) as of 08/06/2018 11:26  06/28/2018 04:08 08/05/2018 11:17 08/05/2018 11:18 08/05/2018 16:24 08/06/2018 04:13  Creatinine 0.64 1.91 (H)   1.81 (H)   No new urinalysis available but previous urinalysis in April showed greater than 500 glucose and urinary tract infection Feels nauseous this morning  Medications: Outpatient medications: Medications Prior to Admission  Medication Sig Dispense Refill Last Dose  . aspirin 81 MG chewable tablet Chew 81 mg by mouth daily. On hold   08/05/2018 at 0800  . atorvastatin (LIPITOR) 10 MG tablet Take 1 tablet by mouth daily.   08/05/2018 at 0800  . bisoprolol (ZEBETA) 5 MG tablet Take 1 tablet (5 mg total) by mouth daily. 90 tablet 3 08/05/2018 at 0800  . canagliflozin (INVOKANA) 100 MG TABS tablet Take 1 tablet by mouth daily.    08/05/2018 at 0800  . cyclobenzaprine (FLEXERIL) 10 MG tablet Take 1 tablet (10 mg total) by mouth 3 (three) times daily as needed for muscle spasms. 30 tablet 0 prn at prn  . Ferrous Sulfate (IRON) 325 (65 Fe) MG TABS Take 1 tablet by mouth daily.  11 08/05/2018 at 0800  . fluticasone furoate-vilanterol (BREO ELLIPTA) 100-25 MCG/INH AEPB Inhale 1 puff into the lungs daily.   08/05/2018 at 0800  . Fluticasone-Umeclidin-Vilant 100-62.5-25 MCG/INH AEPB Inhale 1 puff into the lungs daily.   08/04/2018 at Unknown time  . furosemide (LASIX) 40 MG tablet Take 1 tablet (40 mg total) by mouth daily. 30 tablet 0 08/05/2018 at 0800  . Insulin Human (INSULIN PUMP) SOLN Inject 1 each into the skin 3 times daily with meals, bedtime and 2 AM. (Patient taking differently: Inject 1 each into the skin 3 times daily with meals, bedtime and 2 AM. Pt uses Novolog)   08/05/2018 at Unknown time  . losartan (COZAAR) 100 MG tablet Take 100 mg by mouth daily.  11 08/05/2018 at 0800  . metFORMIN (GLUCOPHAGE) 500 MG tablet Take 1,000 mg by mouth 2 (two) times daily with a meal.    08/05/2018 at 0800  . montelukast (SINGULAIR) 10 MG tablet Take 10 mg by mouth  at bedtime.   08/04/2018 at Unknown time  . solifenacin (VESICARE) 10 MG tablet Take 1 tablet (10 mg total) by mouth daily. 30 tablet 5 08/05/2018 at 0800  . vitamin B-12 (CYANOCOBALAMIN) 500 MCG tablet Take 500 mcg by mouth daily.   08/05/2018 at 0800    Current medications: Current Facility-Administered Medications  Medication Dose Route Frequency Provider Last Rate Last Dose  . acetaminophen (TYLENOL) tablet 650 mg  650 mg Oral Q6H PRN Gladstone Lighter, MD   650 mg at 08/05/18 1628   Or  . acetaminophen (TYLENOL) suppository 650 mg  650 mg Rectal Q6H PRN Gladstone Lighter, MD      . aspirin chewable tablet 81 mg  81 mg Oral Daily Gladstone Lighter, MD      . atorvastatin (LIPITOR) tablet 10 mg  10 mg Oral Daily Gladstone Lighter, MD      . DOPamine (INTROPIN) 800 mg in dextrose  5 % 250 mL (3.2 mg/mL) infusion  10 mcg/kg/min Intravenous Titrated Gladstone Lighter, MD 19.22 mL/hr at 08/06/18 0222 10 mcg/kg/min at 08/06/18 0222  . ferrous sulfate tablet 325 mg  325 mg Oral Daily Gladstone Lighter, MD      . fluticasone furoate-vilanterol (BREO ELLIPTA) 100-25 MCG/INH 1 puff  1 puff Inhalation Daily Gladstone Lighter, MD       And  . umeclidinium bromide (INCRUSE ELLIPTA) 62.5 MCG/INH 1 puff  1 puff Inhalation Daily Gladstone Lighter, MD      . heparin injection 5,000 Units  5,000 Units Subcutaneous Q8H Gladstone Lighter, MD   5,000 Units at 08/06/18 0542  . insulin aspart (novoLOG) injection 0-5 Units  0-5 Units Subcutaneous QHS Gladstone Lighter, MD      . insulin aspart (novoLOG) injection 0-9 Units  0-9 Units Subcutaneous TID WC Gladstone Lighter, MD   3 Units at 08/06/18 0830  . MEDLINE mouth rinse  15 mL Mouth Rinse BID Ottie Glazier, MD   15 mL at 08/06/18 0936  . ondansetron (ZOFRAN) tablet 4 mg  4 mg Oral Q6H PRN Gladstone Lighter, MD       Or  . ondansetron (ZOFRAN) injection 4 mg  4 mg Intravenous Q6H PRN Gladstone Lighter, MD   4 mg at 08/06/18 1119  . sodium zirconium cyclosilicate (LOKELMA) packet 10 g  10 g Oral Once Flora Lipps, MD      . vitamin B-12 (CYANOCOBALAMIN) tablet 500 mcg  500 mcg Oral Daily Gladstone Lighter, MD   500 mcg at 08/05/18 1628      Allergies: Allergies  Allergen Reactions  . Ace Inhibitors   . Gabapentin Hives  . Lisinopril   . Lyrica [Pregabalin]   . Shrimp [Shellfish Allergy] Swelling    Swelling of the lips      Past Medical History: Past Medical History:  Diagnosis Date  . (HFpEF) heart failure with preserved ejection fraction ([place]) 2017   (1) TTE 2017 a. EF 50% b. mild LVH c. mild MR/TR, d. mild LAE e. mild pulmonary HTN  (2) TTE 06/2018 a. EF 50-55%, mild AS with thickening of valve   . Acute on chronic respiratory failure with hypoxia and hypercapnia ([place]) 01/07/2015  . Anemia   . [place] 01/07/2015   . Asthma   . Cataract   . Chronic kidney disease 08/10/2017  . CKD (chronic kidney disease)   . COPD (chronic obstructive pulmonary disease) ([place])    (1) 06/2018 tobacco use, home 3L oxygen   . Diabetes mellitus without complication ([place])    (  1) A1C 7.7 (06/2018)  . Edema, peripheral 04/20/2014  . History of kidney stones   . Hyperlipidemia   . Hypertension   . Iron deficiency anemia 06/22/2014  . Leucocytosis 10/19/2015  . Overactive bladder   . Primary osteoarthritis of right knee 09/01/2016  . Renal insufficiency   . Sciatica 01/07/2015     Past Surgical History: Past Surgical History:  Procedure Laterality Date  . APPENDECTOMY    . CESAREAN SECTION     x3  . CHOLECYSTECTOMY    . COLONOSCOPY WITH PROPOFOL N/A 08/28/2017   Procedure: COLONOSCOPY WITH PROPOFOL;  Surgeon: Lucilla Lame, MD;  Location: [facility] ENDOSCOPY;  Service: Endoscopy;  Laterality: N/A;  . COLONOSCOPY WITH PROPOFOL N/A 08/29/2017   Procedure: COLONOSCOPY WITH PROPOFOL;  Surgeon: Lucilla Lame, MD;  Location: [facility] ENDOSCOPY;  Service: Endoscopy;  Laterality: N/A;  . CYSTOSCOPY W/ URETERAL STENT PLACEMENT Right 09/15/2017   Procedure: CYSTOSCOPY WITH RETROGRADE PYELOGRAM/URETERAL STENT PLACEMENT;  Surgeon: Cleon Gustin, MD;  Location: [facility] ORS;  Service: Urology;  Laterality: Right;  . CYSTOSCOPY/URETEROSCOPY/HOLMIUM LASER/STENT PLACEMENT Right 10/09/2017   Procedure: CYSTOSCOPY/URETEROSCOPY/HOLMIUM LASER/STENT PLACEMENT;  Surgeon: Abbie Sons, MD;  Location: [facility] ORS;  Service: Urology;  Laterality: Right;  right Stent exchange  . EYE SURGERY       Family History: Family History  Problem Relation Age of Onset  . Other Mother        unknown medical history  . Other Father        unknown medical history     Social History: Social History   Socioeconomic History  . Marital status: Married    Spouse name: Not on file  . Number of children: Not on file  . Years of education: Not on file  . Highest  education level: Not on file  Occupational History  . Not on file  Social Needs  . Financial resource strain: Not on file  . Food insecurity:    Worry: Not on file    Inability: Not on file  . Transportation needs:    Medical: Not on file    Non-medical: Not on file  Tobacco Use  . Smoking status: Former Smoker    Last attempt to quit: 12/04/1992    Years since quitting: 25.6  . Smokeless tobacco: Never Used  Substance and Sexual Activity  . Alcohol use: No  . Drug use: No  . Sexual activity: Not on file  Lifestyle  . Physical activity:    Days per week: Not on file    Minutes per session: Not on file  . Stress: Not on file  Relationships  . Social connections:    Talks on phone: Not on file    Gets together: Not on file    Attends religious service: Not on file    Active member of club or organization: Not on file    Attends meetings of clubs or organizations: Not on file    Relationship status: Not on file  . Intimate partner violence:    Fear of current or ex partner: Not on file    Emotionally abused: Not on file    Physically abused: Not on file    Forced sexual activity: Not on file  Other Topics Concern  . Not on file  Social History Narrative  . Not on file    Gen: Denies any fevers or chills HEENT: No vision or hearing c/o CV: reports chest pain.  shortness of breath at presentation Resp: No cough or  sputum production GI:  nausea, vomiting this morning. No diarrhea.  No blood in the stool GU : No problems with voiding.  No hematuria.    MS: Ambulatory.  Denies any acute joint pain or swelling Derm:   No complaints Psych: No complaints Heme: No complaints Neuro: No complaints Endocrine: No complaints  Vital Signs: Blood pressure 121/62, pulse 62, temperature 97.6 F (36.4 C), temperature source Oral, resp. rate (!) 33, height 5\' 3"  (1.6 m), weight 91.4 kg, SpO2 93 %.   Intake/Output Summary (Last 24 hours) at 08/06/2018 1122 Last data filed at  08/06/2018 0400 Gross per 24 hour  Intake 42.87 ml  Output 400 ml  Net -357.13 ml    Weight trends: Filed Weights   08/05/18 1034 08/05/18 1547  Weight: 102.5 kg 91.4 kg    Physical Exam: General:  NAD, laying in bed  HEENT Anicteric, moist oral mucus membranes  Neck:  supple  Lungs: Left > Rt basilar crackles  Heart::  irregular, junctional rhythm  Abdomen: Soft, NT, obese  Extremities:  trace Edema  Neurologic: Alert, oreinted  Skin: No acute rashes  Foley: In place       Lab results: Basic Metabolic Panel: Recent Labs  Lab 08/05/18 1117 08/06/18 0413  NA 133* 131*  K 5.1 5.6*  CL 98 95*  CO2 22 24  GLUCOSE 138* 262*  BUN 54* 62*  CREATININE 1.91* 1.81*  CALCIUM 8.6* 8.7*  MG 2.4  --     Liver Function Tests: Recent Labs  Lab 08/05/18 1117  AST 19  ALT 15  ALKPHOS 72  BILITOT 0.4  PROT 6.6  ALBUMIN 3.5   No results for input(s): LIPASE, AMYLASE in the last 168 hours. No results for input(s): AMMONIA in the last 168 hours.  CBC: Recent Labs  Lab 08/05/18 1117 08/06/18 0413  WBC 11.5* 14.9*  NEUTROABS 8.2*  --   HGB 11.3* 11.5*  HCT 37.4 36.5  MCV 90.6 87.3  PLT 285 284    Cardiac Enzymes: Recent Labs  Lab 08/05/18 1117  TROPONINI <0.03    BNP: Invalid input(s): POCBNP  CBG: Recent Labs  Lab 08/05/18 1535 08/06/18 0742  GLUCAP 98 230*    Microbiology: Recent Results (from the past 720 hour(s))  SARS Coronavirus 2 (CEPHEID - Performed in [place] hospital lab), Hosp Order     Status: None   Collection Time: 08/05/18 11:42 AM  Result Value Ref Range Status   SARS Coronavirus 2 NEGATIVE NEGATIVE Final    Comment: (NOTE) If result is NEGATIVE SARS-CoV-2 target nucleic acids are NOT DETECTED. The SARS-CoV-2 RNA is generally detectable in upper and lower  respiratory specimens during the acute phase of infection. The lowest  concentration of SARS-CoV-2 viral copies this assay can detect is 250  copies / mL. A negative  result does not preclude SARS-CoV-2 infection  and should not be used as the sole basis for treatment or other  patient management decisions.  A negative result may occur with  improper specimen collection / handling, submission of specimen other  than nasopharyngeal swab, presence of viral mutation(s) within the  areas targeted by this assay, and inadequate number of viral copies  (<250 copies / mL). A negative result must be combined with clinical  observations, patient history, and epidemiological information. If result is POSITIVE SARS-CoV-2 target nucleic acids are DETECTED. The SARS-CoV-2 RNA is generally detectable in upper and lower  respiratory specimens dur ing the acute phase of infection.  Positive  results are indicative of active infection with SARS-CoV-2.  Clinical  correlation with patient history and other diagnostic information is  necessary to determine patient infection status.  Positive results do  not rule out bacterial infection or co-infection with other viruses. If result is PRESUMPTIVE POSTIVE SARS-CoV-2 nucleic acids MAY BE PRESENT.   A presumptive positive result was obtained on the submitted specimen  and confirmed on repeat testing.  While 2019 novel coronavirus  (SARS-CoV-2) nucleic acids may be present in the submitted sample  additional confirmatory testing may be necessary for epidemiological  and / or clinical management purposes  to differentiate between  SARS-CoV-2 and other Sarbecovirus currently known to infect humans.  If clinically indicated additional testing with an alternate test  methodology [phone]) is advised. The SARS-CoV-2 RNA is generally  detectable in upper and lower respiratory sp ecimens during the acute  phase of infection. The expected result is Negative. Fact Sheet for Patients:  StrictlyIdeas.no Fact Sheet for Healthcare Providers: BankingDealers.co.za This test is not yet  approved or cleared by the [place] FDA and has been authorized for detection and/or diagnosis of SARS-CoV-2 by FDA under an Emergency Use Authorization (EUA).  This EUA will remain in effect (meaning this test can be used) for the duration of the COVID-19 declaration under Section 564(b)(1) of the Act, 21 U.S.C. section 360bbb-3(b)(1), unless the authorization is terminated or revoked sooner. Performed at [facility], [address] [place], [place] [address]   MRSA PCR Screening     Status: None   Collection Time: 08/05/18  3:59 PM  Result Value Ref Range Status   MRSA by PCR NEGATIVE NEGATIVE Final    Comment:        The GeneXpert MRSA Assay (FDA approved for NASAL specimens only), is one component of a comprehensive MRSA colonization surveillance program. It is not intended to diagnose MRSA infection nor to guide or monitor treatment for MRSA infections. Performed at [facility], [address] [place], [place] [address]      Coagulation Studies: No results for input(s): LABPROT, INR in the last 72 hours.  Urinalysis: No results for input(s): COLORURINE, LABSPEC, PHURINE, GLUCOSEU, HGBUR, BILIRUBINUR, KETONESUR, PROTEINUR, UROBILINOGEN, NITRITE, LEUKOCYTESUR in the last 72 hours.  Invalid input(s): APPERANCEUR      Imaging: Dg Chest Portable 1 View  Result Date: 08/05/2018 CLINICAL DATA:  Shortness of breath, edema EXAM: PORTABLE CHEST 1 VIEW COMPARISON:  06/25/2018 FINDINGS: Cardiomegaly. No significant interval change mild, diffuse interstitial pulmonary opacity and pulmonary vascular prominence. No new or focal airspace opacity. IMPRESSION: Cardiomegaly. No significant interval change mild, diffuse interstitial pulmonary opacity and pulmonary vascular prominence, consistent with mild edema. No new or focal airspace opacity. Electronically Signed   By: Eddie Candle M.D.   On: 08/05/2018 11:36      Assessment & Plan: Pt is a 73 y.o.  caucasian  female with with medical problems of chronic diastolic congestive heart failure, pulmonary hypertension, chronic respiratory failure on 3 L home oxygen, COPD hyperlipidemia, calcium oxalate kidney stone 09/2017 , was admitted on 08/05/2018 with fluid overload and worsening shortness of breath.   1.  Acute kidney injury Baseline creatinine is normal at 0.64 from June 28, 2018 Presenting creatinine was elevated at 1.91 AKI likely secondary to acute  hemodynamic instability causing hypotension.  Now that blood pressure has improved with dopamine, serum creatinine is expected to improve  2. Shortness of breath Multifactorial Junctional rhythm at present On 4 L O2 which is  close to baseline of 3 L Avoid overdiuresis; use lasix PRN  3. Hyperkalemia - Agree with shifting measures - Low K Diet    LOS: 1 Karen [name] 6/2/202011:22 AM    Note: This note was prepared with Dragon dictation. Any transcription errors are unintentional

## 2018-08-07 DIAGNOSIS — I5043 Acute on chronic combined systolic (congestive) and diastolic (congestive) heart failure: Secondary | ICD-10-CM

## 2018-08-07 DIAGNOSIS — R001 Bradycardia, unspecified: Secondary | ICD-10-CM

## 2018-08-07 LAB — CBC
HCT: 35.2 % — ABNORMAL LOW (ref 36.0–46.0)
Hemoglobin: 11.2 g/dL — ABNORMAL LOW (ref 12.0–15.0)
MCH: 27.9 pg (ref 26.0–34.0)
MCHC: 31.8 g/dL (ref 30.0–36.0)
MCV: 87.6 fL (ref 80.0–100.0)
Platelets: 197 10*3/uL (ref 150–400)
RBC: 4.02 MIL/uL (ref 3.87–5.11)
RDW: 16.1 % — ABNORMAL HIGH (ref 11.5–15.5)
WBC: 10.5 10*3/uL (ref 4.0–10.5)
nRBC: 0 % (ref 0.0–0.2)

## 2018-08-07 LAB — BASIC METABOLIC PANEL
Anion gap: 10 (ref 5–15)
BUN: 69 mg/dL — ABNORMAL HIGH (ref 8–23)
CO2: 24 mmol/L (ref 22–32)
Calcium: 9.1 mg/dL (ref 8.9–10.3)
Chloride: 99 mmol/L (ref 98–111)
Creatinine, Ser: 1.67 mg/dL — ABNORMAL HIGH (ref 0.44–1.00)
GFR calc Af Amer: 35 mL/min — ABNORMAL LOW (ref >60)
GFR calc non Af Amer: 30 mL/min — ABNORMAL LOW (ref >60)
Glucose, Bld: 91 mg/dL (ref 70–99)
Potassium: 5 mmol/L (ref 3.5–5.1)
Sodium: 133 mmol/L — ABNORMAL LOW (ref 135–145)

## 2018-08-07 LAB — GLUCOSE, CAPILLARY
Glucose-Capillary: 66 mg/dL — ABNORMAL LOW (ref 70–99)
Glucose-Capillary: 206 mg/dL — ABNORMAL HIGH (ref 70–99)
Glucose-Capillary: 159 mg/dL — ABNORMAL HIGH (ref 70–99)
Glucose-Capillary: 162 mg/dL — ABNORMAL HIGH (ref 70–99)

## 2018-08-07 MED ORDER — FUROSEMIDE 10 MG/ML IJ SOLN
20.0000 mg | Freq: Two times a day (BID) | INTRAMUSCULAR | Status: DC
  Administered 2018-08-07 – 2018-08-08 (×4): 20 mg via INTRAVENOUS
  Filled 2018-08-07 (×4): qty 2

## 2018-08-07 NOTE — Progress Notes (Signed)
[place] [facility] [place] NAME: Karen [name]    MR#:  [id number]  DATE OF BIRTH:  1945/12/02  SUBJECTIVE:  CHIEF COMPLAINT:   Chief Complaint  Patient presents with  . Edema  . Shortness of Breath   - weaned off dopamine drip. Feels much better today - no nausea  REVIEW OF SYSTEMS:  Review of Systems  Constitutional: Negative for chills and fever.  HENT: Negative for congestion, ear discharge, hearing loss and nosebleeds.   Eyes: Negative for blurred vision and double vision.  Respiratory: Positive for shortness of breath. Negative for cough and wheezing.   Cardiovascular: Positive for leg swelling. Negative for chest pain and palpitations.  Gastrointestinal: Negative for abdominal pain, constipation, diarrhea, nausea and vomiting.  Genitourinary: Negative for dysuria.  Musculoskeletal: Negative for myalgias.  Neurological: Negative for dizziness, focal weakness, seizures, weakness and headaches.  Psychiatric/Behavioral: Negative for depression.    DRUG ALLERGIES:   Allergies  Allergen Reactions  . Ace Inhibitors   . Gabapentin Hives  . Lisinopril   . Lyrica [Pregabalin]   . Shrimp [Shellfish Allergy] Swelling    Swelling of the lips    VITALS:  Blood pressure (!) 109/42, pulse 64, temperature 97.6 F (36.4 C), temperature source Oral, resp. rate (!) 29, height 5\' 3"  (1.6 m), weight 91 kg, SpO2 96 %.  PHYSICAL EXAMINATION:  Physical Exam  GENERAL:  73 y.o.-year-old obese patient sitting in the bed,not in acute distress EYES: Pupils equal, round, reactive to light and accommodation. No scleral icterus. Extraocular muscles intact.  HEENT: Head atraumatic, normocephalic. Oropharynx and nasopharynx clear.  NECK:  Supple, no jugular venous distention. No thyroid enlargement, no tenderness.  LUNGS: Normal breath sounds bilaterally, no wheezing, rhonchi or crepitation. No use of accessory muscles of respiration.  Decreased  bibasilar breath sounds with fine rales at the bases. CARDIOVASCULAR: S1, S2 normal. No murmurs, rubs, or gallops.  ABDOMEN: Soft, nontender, nondistended. Bowel sounds present. No organomegaly or mass.  EXTREMITIES: No cyanosis, or clubbing.   Improved lower extremity edema noted NEUROLOGIC: Cranial nerves II through XII are intact. Muscle strength 5/5 in all extremities. Sensation intact. Gait not checked.  PSYCHIATRIC: The patient is alert and oriented x 3.  SKIN: No obvious rash, lesion, or ulcer.    LABORATORY PANEL:   CBC Recent Labs  Lab 08/07/18 0524  WBC 10.5  HGB 11.2*  HCT 35.2*  PLT 197   ------------------------------------------------------------------------------------------------------------------  Chemistries  Recent Labs  Lab 08/05/18 1117  08/07/18 0524  NA 133*   < > 133*  K 5.1   < > 5.0  CL 98   < > 99  CO2 22   < > 24  GLUCOSE 138*   < > 91  BUN 54*   < > 69*  CREATININE 1.91*   < > 1.67*  CALCIUM 8.6*   < > 9.1  MG 2.4  --   --   AST 19  --   --   ALT 15  --   --   ALKPHOS 72  --   --   BILITOT 0.4  --   --    < > = values in this interval not displayed.   ------------------------------------------------------------------------------------------------------------------  Cardiac Enzymes Recent Labs  Lab 08/05/18 1117  TROPONINI <0.03   ------------------------------------------------------------------------------------------------------------------  RADIOLOGY:  No results found.  EKG:   Orders placed or performed during the hospital encounter of 08/05/18  . EKG 12-Lead  . EKG  12-Lead  . EKG 12-Lead  . EKG 12-Lead  . EKG    ASSESSMENT AND PLAN:   Karen [name]  is a 73 y.o. female with a known history of chronic respiratory failure secondary to COPD on 3 L home oxygen, diastolic CHF, hypertension, anemia, osteoarthritis presents to hospital secondary to worsening weight gain and shortness of breath.  1.  Junctional  bradycardia-     Asymptomatic without dizziness, syncope or presyncopal episodes. - held bisoprolol, HR in 60's. Off dopamine drip this morning -monitor electrolytes.  Echocardiogram with LVH, normal EF and elevated pulmonary pressures..  - no plans for pacemaker at this time  2.  Acute on chronic diastolic heart failure- off dopamine drip, BP is improving, started low dose lasix -.  Echocardiogram done bedside showing EF of 50 to 55% but has moderate to severe pulmonary hypertension -Continue daily weights and input and output monitoring -Appreciate cardiology consult  3.  Acute renal failure-likely ATN secondary to hypotension. -Appreciate nephrology consult. - monitor only at this time.   4.  Hyperkalemia-hold losartan.  received Lokelma given. Continue to monitor - on lasix  5.  Chronic respiratory failure secondary to COPD-stable, on 3 L chronic home oxygen.  Continue inhalers.  6.  Diabetes mellitus-on insulin pump.  Appreciate diabetes coordinator input.  Continue insulin pump.  According to diabetes coordinator notes, delivers 40 units of basal insulin and she takes pre-meal clicks  7.  DVT prophylaxis-on subcu heparin  Independent at baseline, ambulates with a walker PT consult after transfer    All the records are reviewed and case discussed with Care Management/Social Workerr. Management plans discussed with the patient, family and they are in agreement.  CODE STATUS: Full code  TOTAL TIME TAKING CARE OF THIS PATIENT: 36 minutes.   POSSIBLE D/C IN 2 DAYS, DEPENDING ON CLINICAL CONDITION.   Gladstone Lighter M.D on 08/07/2018 at 12:23 PM  Between 7am to 6pm - Pager - [phone]  After 6pm go to [email or web address] - password [email or web address]  Office  [phone]  CC: Primary care physician; Ricardo Jericho, NP

## 2018-08-07 NOTE — Progress Notes (Addendum)
Progress Note  Patient Name: Karen [name] Date of Encounter: 08/07/2018  Primary Cardiologist: Ida Rogue, MD   Subjective   Feels much better today and stated her strength is returning. She is still eager to ambulate and sit in the chair. Continues to deny sx of presyncope or recent LOC since admission.    Continues to report SOB, though greatly improved from yesterday.    Denies chest pain, palpitations, racing HR.  Of note, she lives at home with her husband and will have him to assist with her return home.  Inpatient Medications    Scheduled Meds: . aspirin  81 mg Oral Daily  . atorvastatin  10 mg Oral Daily  . ferrous sulfate  325 mg Oral Daily  . fluticasone furoate-vilanterol  1 puff Inhalation Daily   And  . umeclidinium bromide  1 puff Inhalation Daily  . furosemide  20 mg Intravenous Q12H  . heparin  5,000 Units Subcutaneous Q8H  . insulin aspart  0-5 Units Subcutaneous QHS  . insulin aspart  0-9 Units Subcutaneous TID WC  . insulin pump   Subcutaneous Q24H  . mouth rinse  15 mL Mouth Rinse BID  . vitamin B-12  500 mcg Oral Daily   Continuous Infusions:  PRN Meds: acetaminophen **OR** acetaminophen, ondansetron **OR** ondansetron (ZOFRAN) IV   Vital Signs    Vitals:   08/07/18 0403 08/07/18 0500 08/07/18 0600 08/07/18 0700  BP: (!) 97/45 (!) 102/50 (!) 91/41 (!) 102/44  Pulse: (!) 50 (!) 58 (!) 59 (!) 58  Resp: 15 (!) 23 (!) 22 (!) 21  Temp:      TempSrc:      SpO2: 100% 100% 100% 100%  Weight:  91 kg    Height:       No intake or output data in the 24 hours ending 08/07/18 1104 Filed Weights   08/05/18 1034 08/05/18 1547 08/07/18 0500  Weight: 102.5 kg 91.4 kg 91 kg    Telemetry    Junctional rhythm 40s-80s, PVCs yesterday and overnight with conversion to SR early 6/3 AM - Personally Reviewed  ECG    No new tracings - Personally Reviewed  Physical Exam   GEN: No acute distress Neck: JVD difficult to assess d/t body  habitus Cardiac: RRR, 2/6 systolic murmur. No rubs or gallops.  Respiratory: Bibasilar reduced breath sounds, bibasilar trace crackles, diffuse wheezing.  GI: Soft, nontender, non-distended  MS: Improving, minimal, bilateral lower extremity edema; No deformity. Neuro:  Nonfocal  Psych: Normal affect   Labs    Chemistry Recent Labs  Lab 08/05/18 1117 08/06/18 0413 08/06/18 1329 08/07/18 0524  NA 133* 131*  --  133*  K 5.1 5.6* 6.0* 5.0  CL 98 95*  --  99  CO2 22 24  --  24  GLUCOSE 138* 262*  --  91  BUN 54* 62*  --  69*  CREATININE 1.91* 1.81*  --  1.67*  CALCIUM 8.6* 8.7*  --  9.1  PROT 6.6  --   --   --   ALBUMIN 3.5  --   --   --   AST 19  --   --   --   ALT 15  --   --   --   ALKPHOS 72  --   --   --   BILITOT 0.4  --   --   --   GFRNONAA 25* 27*  --  30*  GFRAA 29* 31*  --  35*  ANIONGAP 13 12  --  10     Hematology Recent Labs  Lab 08/05/18 1117 08/06/18 0413 08/07/18 0524  WBC 11.5* 14.9* 10.5  RBC 4.13 4.18 4.02  HGB 11.3* 11.5* 11.2*  HCT 37.4 36.5 35.2*  MCV 90.6 87.3 87.6  MCH 27.4 27.5 27.9  MCHC 30.2 31.5 31.8  RDW 16.6* 15.9* 16.1*  PLT 285 284 197    Cardiac Enzymes Recent Labs  Lab 08/05/18 1117  TROPONINI <0.03   No results for input(s): TROPIPOC in the last 168 hours.   BNP Recent Labs  Lab 08/05/18 1118 08/05/18 1624  BNP 826.0* 831.0*     DDimer No results for input(s): DDIMER in the last 168 hours.   Radiology    Dg Chest Portable 1 View  Result Date: 08/05/2018 CLINICAL DATA:  Shortness of breath, edema EXAM: PORTABLE CHEST 1 VIEW COMPARISON:  06/25/2018 FINDINGS: Cardiomegaly. No significant interval change mild, diffuse interstitial pulmonary opacity and pulmonary vascular prominence. No new or focal airspace opacity. IMPRESSION: Cardiomegaly. No significant interval change mild, diffuse interstitial pulmonary opacity and pulmonary vascular prominence, consistent with mild edema. No new or focal airspace opacity.  Electronically Signed   By: Eddie Candle M.D.   On: 08/05/2018 11:36    Cardiac Studies   08/05/2018 Echo  1. The left ventricle has low normal systolic function, with an ejection fraction of 50-55%. The cavity size was normal. There is mild concentric left ventricular hypertrophy. Left ventricular diastolic Doppler parameters are indeterminate.  2. The right ventricle has low normal systolic function. The cavity was mildly enlarged. There is no increase in right ventricular wall thickness. Right ventricular systolic pressure is moderately elevated with an estimated pressure of 60.4 mmHg.  3. Left atrial size was mildly dilated.  4. Right atrial size was mildly dilated.  5. The mitral valve is grossly normal.  6. The tricuspid valve is grossly normal. Tricuspid valve regurgitation is mild-moderate.  7. The aortic valve is grossly normal. Mild thickening of the aortic valve. Mild calcification of the aortic valve. Aortic valve regurgitation was not assessed by color flow Doppler. Mild stenosis of the aortic valve.  8. The inferior vena cava was dilated in size with <50% respiratory variability.  Patient Profile     73 y.o. 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  Assessment & Plan   Acute on chronic HFpEF / pulmonary HTN --Still volume overloaded on exam s/p start of diuresis yesterday with IV lasix 40mg  x1.  --Echocardiogram above EF 50 to 55%, LVH, mild LAE/RAE, mild TR/AS, pulmonary HTN.  --Worsening HFpEF / volume overload likely in the setting of severe pulmonary HTN, bradycardia, and hypotension.  --Continue IV diuresis with titration as needed for adequate output and until euvolemic on exam. Continue to monitor daily  weights, strict I's/O's. No intakes or outputs have been recorded and should be done for proper management of diuresis. Weight 102.5kg  91kg. --Continue to monitor renal function with daily BMET. Cr improved since yesterday with slight bump in BUN. Cr 1.81  1.67; BUN 62  69. --As below, K elevated with nephrology following. Avoid nephrotoxic agents. -- Given bradycardia and hypotension on BB, avoid rate controlling agents (including bisoprolol). --Dopamine weaned yesterday and following improvement in bradycardia and hypotension  with 48h BB washout.  --Encourage sitting in the chair and ambulation as tolerated by patient. --Recommend aassess chronotropic and BP response before discharge.  --No indication for pacemaker.    Hyperkalemia --6/2 K of 6.0  5.0. --Mg 2.4 (6/1) --Daily BMET as above --Continue to hold ARB, nephrotoxins --Nephrology consulted and following  Junctional bradycardia --Continues to deny presyncope, LOC since admission. --Started on bisoprolol in April with bradycardia at admission (last dose 5/31 in AM). BB held with 48h washout showing improved HR.  --S/p atropine and dopamine. --Continue to hold BB, ARB. --No indication for pacemaker.   --Recommend assess chronotropic and BP response before discharge. --SR earlier this AM on telemetry. Will order repeat EKG to confirm.  Hypotension --Continue to monitor. No BB as above.  AKI/ARF -Baseline Cr 0.64 06/28/2018. Elevated from baseline but improving with diuresis --Consider cardiorenal syndrome as above given her significant volume overload.  --Hyperkalemia on most recent BMET. --Further recommendations per nephrology.  Morbid obesity  - Lifestyle changes recommended  Hypothyroid -TSH mildly elevated at admission.  Consider hypothyroidism as contributing to some of her worsening heart failure.  For questions or updates, please contact [place] Please consult [email or web address] for contact info under         Signed, Arvil Chaco, PA-C  08/07/2018, 11:04 AM

## 2018-08-07 NOTE — Progress Notes (Signed)
[facility], [place] 08/07/18  Subjective:   UOP 400 cc  S Cr slightly improved to 1.6 K improved to 5.0 Ate 100 % of her breakfast. States breathing is better   Objective:  Vital signs in last 24 hours:  Pulse Rate:  [48-66] 58 (06/03 0700) Resp:  [15-33] 21 (06/03 0700) BP: (81-129)/(40-84) 102/44 (06/03 0700) SpO2:  [93 %-100 %] 100 % (06/03 0700) Weight:  [91 kg] 91 kg (06/03 0500)  Weight change: -11.5 kg Filed Weights   08/05/18 1034 08/05/18 1547 08/07/18 0500  Weight: 102.5 kg 91.4 kg 91 kg    Intake/Output:   No intake or output data in the 24 hours ending 08/07/18 0901  Physical Exam: General:  NAD, laying in bed  HEENT Anicteric, moist oral mucus membranes  Neck:  supple  Lungs: Mild basilar crackles, + mild diffuse wheezing  Heart::  regular, sinus rhythm  Abdomen: Soft, NT, obese  Extremities:  trace Edema  Neurologic: Alert, oreinted  Skin: No acute rashes  Foley: External cathter In place      Basic Metabolic Panel:  Recent Labs  Lab 08/05/18 1117 08/06/18 0413 08/06/18 1329 08/07/18 0524  NA 133* 131*  --  133*  K 5.1 5.6* 6.0* 5.0  CL 98 95*  --  99  CO2 22 24  --  24  GLUCOSE 138* 262*  --  91  BUN 54* 62*  --  69*  CREATININE 1.91* 1.81*  --  1.67*  CALCIUM 8.6* 8.7*  --  9.1  MG 2.4  --   --   --      CBC: Recent Labs  Lab 08/05/18 1117 08/06/18 0413 08/07/18 0524  WBC 11.5* 14.9* 10.5  NEUTROABS 8.2*  --   --   HGB 11.3* 11.5* 11.2*  HCT 37.4 36.5 35.2*  MCV 90.6 87.3 87.6  PLT 285 284 197     No results found for: HEPBSAG, HEPBSAB, HEPBIGM    Microbiology:  Recent Results (from the past 240 hour(s))  SARS Coronavirus 2 (CEPHEID - Performed in [facility] hospital lab), Hosp Order     Status: None   Collection Time: 08/05/18 11:42 AM  Result Value Ref Range Status   SARS Coronavirus 2 NEGATIVE NEGATIVE Final    Comment: (NOTE) If result is NEGATIVE SARS-CoV-2 target nucleic  acids are NOT DETECTED. The SARS-CoV-2 RNA is generally detectable in upper and lower  respiratory specimens during the acute phase of infection. The lowest  concentration of SARS-CoV-2 viral copies this assay can detect is 250  copies / mL. A negative result does not preclude SARS-CoV-2 infection  and should not be used as the sole basis for treatment or other  patient management decisions.  A negative result may occur with  improper specimen collection / handling, submission of specimen other  than nasopharyngeal swab, presence of viral mutation(s) within the  areas targeted by this assay, and inadequate number of viral copies  (<250 copies / mL). A negative result must be combined with clinical  observations, patient history, and epidemiological information. If result is POSITIVE SARS-CoV-2 target nucleic acids are DETECTED. The SARS-CoV-2 RNA is generally detectable in upper and lower  respiratory specimens dur ing the acute phase of infection.  Positive  results are indicative of active infection with SARS-CoV-2.  Clinical  correlation with patient history and other diagnostic information is  necessary to determine patient infection status.  Positive results do  not rule out bacterial infection or  co-infection with other viruses. If result is PRESUMPTIVE POSTIVE SARS-CoV-2 nucleic acids MAY BE PRESENT.   A presumptive positive result was obtained on the submitted specimen  and confirmed on repeat testing.  While 2019 novel coronavirus  (SARS-CoV-2) nucleic acids may be present in the submitted sample  additional confirmatory testing may be necessary for epidemiological  and / or clinical management purposes  to differentiate between  SARS-CoV-2 and other Sarbecovirus currently known to infect humans.  If clinically indicated additional testing with an alternate test  methodology [id number]) is advised. The SARS-CoV-2 RNA is generally  detectable in upper and lower respiratory  sp ecimens during the acute  phase of infection. The expected result is Negative. Fact Sheet for Patients:  StrictlyIdeas.no Fact Sheet for Healthcare Providers: BankingDealers.co.za This test is not yet approved or cleared by the [place] FDA and has been authorized for detection and/or diagnosis of SARS-CoV-2 by FDA under an Emergency Use Authorization (EUA).  This EUA will remain in effect (meaning this test can be used) for the duration of the COVID-19 declaration under Section 564(b)(1) of the Act, 21 U.S.C. section 360bbb-3(b)(1), unless the authorization is terminated or revoked sooner. Performed at [facility], [address] [place], [place] [address]   MRSA PCR Screening     Status: None   Collection Time: 08/05/18  3:59 PM  Result Value Ref Range Status   MRSA by PCR NEGATIVE NEGATIVE Final    Comment:        The GeneXpert MRSA Assay (FDA approved for NASAL specimens only), is one component of a comprehensive MRSA colonization surveillance program. It is not intended to diagnose MRSA infection nor to guide or monitor treatment for MRSA infections. Performed at [facility], [address] [place], [place] [address]     Coagulation Studies: No results for input(s): LABPROT, INR in the last 72 hours.  Urinalysis: No results for input(s): COLORURINE, LABSPEC, PHURINE, GLUCOSEU, HGBUR, BILIRUBINUR, KETONESUR, PROTEINUR, UROBILINOGEN, NITRITE, LEUKOCYTESUR in the last 72 hours.  Invalid input(s): APPERANCEUR    Imaging: Dg Chest Portable 1 View  Result Date: 08/05/2018 CLINICAL DATA:  Shortness of breath, edema EXAM: PORTABLE CHEST 1 VIEW COMPARISON:  06/25/2018 FINDINGS: Cardiomegaly. No significant interval change mild, diffuse interstitial pulmonary opacity and pulmonary vascular prominence. No new or focal airspace opacity. IMPRESSION: Cardiomegaly. No significant interval change mild,  diffuse interstitial pulmonary opacity and pulmonary vascular prominence, consistent with mild edema. No new or focal airspace opacity. Electronically Signed   By: Eddie Candle M.D.   On: 08/05/2018 11:36     Medications:   . DOPamine Stopped (08/06/18 1742)   . aspirin  81 mg Oral Daily  . atorvastatin  10 mg Oral Daily  . ferrous sulfate  325 mg Oral Daily  . fluticasone furoate-vilanterol  1 puff Inhalation Daily   And  . umeclidinium bromide  1 puff Inhalation Daily  . furosemide  20 mg Intravenous Q12H  . heparin  5,000 Units Subcutaneous Q8H  . insulin aspart  0-5 Units Subcutaneous QHS  . insulin aspart  0-9 Units Subcutaneous TID WC  . insulin pump   Subcutaneous Q24H  . mouth rinse  15 mL Mouth Rinse BID  . vitamin B-12  500 mcg Oral Daily   acetaminophen **OR** acetaminophen, ondansetron **OR** ondansetron (ZOFRAN) IV  Assessment/ Plan:  73 y.o. female with medical problems of chronic diastolic congestive heart failure, pulmonary hypertension, chronic respiratory failure on 3 L home oxygen, COPD hyperlipidemia, calcium oxalate  kidney stone 09/2017 , was admitted on 08/05/2018 with fluid overload and worsening shortness of breath.   1.  Acute kidney injury Baseline creatinine is normal at 0.64 from June 28, 2018 Presenting creatinine was elevated at 1.91 AKI likely secondary to acute  hemodynamic instability causing hypotension.  Now that blood pressure has improved with dopamine, serum creatinine is expected to improve  2. Shortness of breath Multifactorial Junctional rhythm appears to have converted to sinus O2 is at baseline of 3 L Avoid overdiuresis; use lasix or torsemide PRN  3. Hyperkalemia - Agree with shifting measures - Low K Diet - improved   LOS: 2 Somaly Marteney 6/3/20209:01 AM  [place], [place]  Note: This note was prepared with Dragon dictation. Any transcription errors are unintentional

## 2018-08-07 NOTE — Progress Notes (Signed)
Gave report to Lexi on 2A, pnt is transferring to 245.

## 2018-08-08 ENCOUNTER — Ambulatory Visit: Payer: Medicare Other | Admitting: Family

## 2018-08-08 LAB — GLUCOSE, CAPILLARY
Glucose-Capillary: 138 mg/dL — ABNORMAL HIGH (ref 70–99)
Glucose-Capillary: 330 mg/dL — ABNORMAL HIGH (ref 70–99)
Glucose-Capillary: 141 mg/dL — ABNORMAL HIGH (ref 70–99)
Glucose-Capillary: 170 mg/dL — ABNORMAL HIGH (ref 70–99)

## 2018-08-08 LAB — CBC WITH DIFFERENTIAL/PLATELET
Abs Immature Granulocytes: 0.03 10*3/uL (ref 0.00–0.07)
Basophils Absolute: 0 10*3/uL (ref 0.0–0.1)
Basophils Relative: 0 %
Eosinophils Absolute: 0.3 10*3/uL (ref 0.0–0.5)
Eosinophils Relative: 3 %
HCT: 35.7 % — ABNORMAL LOW (ref 36.0–46.0)
Hemoglobin: 11.2 g/dL — ABNORMAL LOW (ref 12.0–15.0)
Immature Granulocytes: 0 %
Lymphocytes Relative: 14 %
Lymphs Abs: 1.3 10*3/uL (ref 0.7–4.0)
MCH: 27.7 pg (ref 26.0–34.0)
MCHC: 31.4 g/dL (ref 30.0–36.0)
MCV: 88.4 fL (ref 80.0–100.0)
Monocytes Absolute: 0.7 10*3/uL (ref 0.1–1.0)
Monocytes Relative: 8 %
Neutro Abs: 6.4 10*3/uL (ref 1.7–7.7)
Neutrophils Relative %: 75 %
Platelets: 242 10*3/uL (ref 150–400)
RBC: 4.04 MIL/uL (ref 3.87–5.11)
RDW: 15.8 % — ABNORMAL HIGH (ref 11.5–15.5)
WBC: 8.7 10*3/uL (ref 4.0–10.5)
nRBC: 0 % (ref 0.0–0.2)

## 2018-08-08 LAB — BASIC METABOLIC PANEL
Anion gap: 10 (ref 5–15)
BUN: 39 mg/dL — ABNORMAL HIGH (ref 8–23)
CO2: 31 mmol/L (ref 22–32)
Calcium: 9.1 mg/dL (ref 8.9–10.3)
Chloride: 97 mmol/L — ABNORMAL LOW (ref 98–111)
Creatinine, Ser: 0.69 mg/dL (ref 0.44–1.00)
GFR calc Af Amer: >60 mL/min (ref >60)
GFR calc non Af Amer: >60 mL/min (ref >60)
Glucose, Bld: 147 mg/dL — ABNORMAL HIGH (ref 70–99)
Potassium: 4 mmol/L (ref 3.5–5.1)
Sodium: 138 mmol/L (ref 135–145)

## 2018-08-08 NOTE — Evaluation (Signed)
Physical Therapy Evaluation Patient Details Name: Karen [name] MRN: [id number] DOB: 1946-01-25 Today's Date: 08/08/2018   History of Present Illness  Patient is a pleasant 73 y/o female with history of COPD, chronic respiratory failure on 3L admitted for edema and shortness of breath.   Clinical Impression  Patient is a pleasant 73 y/o female that presents with LE weakness, edema, and shortness of breath. She is typically able to ambulate around the house with RW, but has not been performing community mobility for some time, as her husband does her errands. She is able to complete bed mobility without physical assistance, however does require balance/force assistance with sit to stand transfers. She is only able to ambulate 3-5 feet at a time before seated rest break currently due to deconditioning. She would likely benefit from SNF placement at discharge once medically appropriate.     Follow Up Recommendations SNF    Equipment Recommendations  Rolling walker with 5" wheels    Recommendations for Other Services       Precautions / Restrictions Precautions Precautions: Fall Restrictions Weight Bearing Restrictions: No      Mobility  Bed Mobility Overal bed mobility: Needs Assistance Bed Mobility: Supine to Sit     Supine to sit: Min guard     General bed mobility comments: Patient requires balance assistance for torso.   Transfers Overall transfer level: Needs assistance Equipment used: Rolling walker (2 wheeled) Transfers: Sit to/from Stand Sit to Stand: Min assist         General transfer comment: Patient required assistance with transfer due to LE weakness.   Ambulation/Gait Ambulation/Gait assistance: Min guard Gait Distance (Feet): 5 Feet Assistive device: Rolling walker (2 wheeled) Gait Pattern/deviations: Decreased step length - right;Decreased step length - left   Gait velocity interpretation: <1.31 ft/sec, indicative of household ambulator General  Gait Details: Patient ambulated short distance ~5 feet with minimal foot clearance with RW due to LE weakness.   Stairs            Wheelchair Mobility    Modified Rankin (Stroke Patients Only)       Balance Overall balance assessment: Mild deficits observed, not formally tested                                           Pertinent Vitals/Pain Pain Assessment: Faces Faces Pain Scale: Hurts a little bit Pain Location: Knees, lumbar spine  Pain Descriptors / Indicators: Aching;Tightness Pain Intervention(s): Limited activity within patient's tolerance    Home Living Family/patient expects to be discharged to:: Private residence Living Arrangements: Children Available Help at Discharge: Family Type of Home: Mobile home Home Access: Stairs to enter Entrance Stairs-Rails: Can reach both Entrance Stairs-Number of Steps: 4-5 Home Layout: One level Home Equipment: Grab bars - toilet;Toilet riser;Walker - 2 wheels;Cane - single point;Hand held shower head;Adaptive equipment      Prior Function Level of Independence: Independent with assistive device(s);Needs assistance   Gait / Transfers Assistance Needed: Patient using RW for >1 year independently.  ADL's / Homemaking Assistance Needed: Spouse has been assisting with tub transfers and LB dressing tasks, spouse does grocery shopping and cooking  Comments: Patient using RW for >1 year independently.     Hand Dominance   Dominant Hand: Right    Extremity/Trunk Assessment   Upper Extremity Assessment Upper Extremity Assessment: Generalized weakness    Lower Extremity  Assessment Lower Extremity Assessment: Generalized weakness       Communication   Communication: No difficulties  Cognition Arousal/Alertness: Awake/alert Behavior During Therapy: WFL for tasks assessed/performed Overall Cognitive Status: Within Functional Limits for tasks assessed                                         General Comments      Exercises General Exercises - Lower Extremity Ankle Circles/Pumps: AROM;Both;10 reps Long Arc Quad: AROM;Both;15 reps Heel Slides: AROM;Both;15 reps Hip Flexion/Marching: AROM;Both;10 reps Other Exercises Other Exercises: Multiple bouts of sit to stand with ambulation performed after initial bout, no loss of balance observed but frequent rest breaks provided in chair.    Assessment/Plan    PT Assessment Patient needs continued PT services  PT Problem List Decreased strength;Cardiopulmonary status limiting activity;Decreased activity tolerance;Decreased balance;Decreased mobility       PT Treatment Interventions DME instruction;Therapeutic exercise;Gait training;Balance training;Stair training;Neuromuscular re-education;Therapeutic activities;Patient/family education    PT Goals (Current goals can be found in the Care Plan section)  Acute Rehab PT Goals Patient Stated Goal: To get stronger to return home safely  PT Goal Formulation: With patient Time For Goal Achievement: 08/22/18 Potential to Achieve Goals: Good    Frequency Min 2X/week   Barriers to discharge        Co-evaluation               AM-PAC PT "6 Clicks" Mobility  Outcome Measure Help needed turning from your back to your side while in a flat bed without using bedrails?: A Little Help needed moving from lying on your back to sitting on the side of a flat bed without using bedrails?: A Little Help needed moving to and from a bed to a chair (including a wheelchair)?: A Little Help needed standing up from a chair using your arms (e.g., wheelchair or bedside chair)?: A Little Help needed to walk in hospital room?: A Lot Help needed climbing 3-5 steps with a railing? : Total 6 Click Score: 15    End of Session Equipment Utilized During Treatment: Gait belt Activity Tolerance: Patient tolerated treatment well;Patient limited by fatigue Patient left: with chair alarm set;in  chair;with call bell/phone within reach Nurse Communication: Mobility status PT Visit Diagnosis: Muscle weakness (generalized) (M62.81);Difficulty in walking, not elsewhere classified (R26.2)    Time: 1205-1229 PT Time Calculation (min) (ACUTE ONLY): 24 min   Charges:   PT Evaluation $PT Eval Moderate Complexity: 1 Mod PT Treatments $Therapeutic Exercise: 8-22 mins       Royce Macadamia PT, DPT, CSCS    08/08/2018, 1:59 PM

## 2018-08-08 NOTE — Care Management Important Message (Signed)
Important Message  Patient Details  Name: Karen [name] MRN: [id number] Date of Birth: 27-Aug-1945   Medicare Important Message Given:  Yes    [name] 08/08/2018, 2:28 PM

## 2018-08-08 NOTE — Plan of Care (Signed)
Purewick in place, receiving IV lasix, with good output. On chronic 3L O2 Problem: Nutrition: Goal: Adequate nutrition will be maintained Outcome: Progressing   Problem: Coping: Goal: Level of anxiety will decrease Outcome: Progressing   Problem: Pain Managment: Goal: General experience of comfort will improve Outcome: Progressing Note:  Complained of chronic back pain, treated with tylenol once    Problem: Safety: Goal: Ability to remain free from injury will improve Outcome: Progressing   Problem: Skin Integrity: Goal: Risk for impaired skin integrity will decrease Outcome: Progressing

## 2018-08-08 NOTE — Progress Notes (Signed)
[facility], [place] 08/08/18  Subjective:   UOP 2000 cc  S Cr slightly improved to 0.7 K improved to 4.0  States breathing is better   Objective:  Vital signs in last 24 hours:  Temp:  [98.2 F (36.8 C)-99 F (37.2 C)] 98.2 F (36.8 C) (06/04 0801) Pulse Rate:  [64-85] 78 (06/04 0801) Resp:  [18-31] 18 (06/04 0801) BP: (105-132)/(42-74) 116/74 (06/04 0801) SpO2:  [92 %-98 %] 94 % (06/04 0801) Weight:  [101.9 kg-102.1 kg] 102.1 kg (06/04 0219)  Weight change: 10.9 kg Filed Weights   08/07/18 0500 08/07/18 2157 08/08/18 0219  Weight: 91 kg 101.9 kg 102.1 kg    Intake/Output:    Intake/Output Summary (Last 24 hours) at 08/08/2018 1033 Last data filed at 08/08/2018 0900 Gross per 24 hour  Intake 600 ml  Output 2000 ml  Net -1400 ml    Physical Exam: General:  NAD, laying in bed  HEENT Anicteric, moist oral mucus membranes  Neck:  supple  Lungs: Mild basilar crackles,  O2 by [place]  Heart::  regular, sinus rhythm  Abdomen: Soft, NT, obese  Extremities:  trace Edema  Neurologic: Alert, oreinted  Skin: No acute rashes  Foley: External cathter In place      Basic Metabolic Panel:  Recent Labs  Lab 08/05/18 1117 08/06/18 0413 08/06/18 1329 08/07/18 0524 08/08/18 0624  NA 133* 131*  --  133* 138  K 5.1 5.6* 6.0* 5.0 4.0  CL 98 95*  --  99 97*  CO2 22 24  --  24 31  GLUCOSE 138* 262*  --  91 147*  BUN 54* 62*  --  69* 39*  CREATININE 1.91* 1.81*  --  1.67* 0.69  CALCIUM 8.6* 8.7*  --  9.1 9.1  MG 2.4  --   --   --   --      CBC: Recent Labs  Lab 08/05/18 1117 08/06/18 0413 08/07/18 0524 08/08/18 0624  WBC 11.5* 14.9* 10.5 8.7  NEUTROABS 8.2*  --   --  6.4  HGB 11.3* 11.5* 11.2* 11.2*  HCT 37.4 36.5 35.2* 35.7*  MCV 90.6 87.3 87.6 88.4  PLT 285 284 197 242     No results found for: HEPBSAG, HEPBSAB, HEPBIGM    Microbiology:  Recent Results (from the past 240 hour(s))  SARS Coronavirus 2 (CEPHEID - Performed in  [place] hospital lab), Hosp Order     Status: None   Collection Time: 08/05/18 11:42 AM  Result Value Ref Range Status   SARS Coronavirus 2 NEGATIVE NEGATIVE Final    Comment: (NOTE) If result is NEGATIVE SARS-CoV-2 target nucleic acids are NOT DETECTED. The SARS-CoV-2 RNA is generally detectable in upper and lower  respiratory specimens during the acute phase of infection. The lowest  concentration of SARS-CoV-2 viral copies this assay can detect is 250  copies / mL. A negative result does not preclude SARS-CoV-2 infection  and should not be used as the sole basis for treatment or other  patient management decisions.  A negative result may occur with  improper specimen collection / handling, submission of specimen other  than nasopharyngeal swab, presence of viral mutation(s) within the  areas targeted by this assay, and inadequate number of viral copies  (<250 copies / mL). A negative result must be combined with clinical  observations, patient history, and epidemiological information. If result is POSITIVE SARS-CoV-2 target nucleic acids are DETECTED. The SARS-CoV-2 RNA is generally detectable in upper  and lower  respiratory specimens dur ing the acute phase of infection.  Positive  results are indicative of active infection with SARS-CoV-2.  Clinical  correlation with patient history and other diagnostic information is  necessary to determine patient infection status.  Positive results do  not rule out bacterial infection or co-infection with other viruses. If result is PRESUMPTIVE POSTIVE SARS-CoV-2 nucleic acids MAY BE PRESENT.   A presumptive positive result was obtained on the submitted specimen  and confirmed on repeat testing.  While 2019 novel coronavirus  (SARS-CoV-2) nucleic acids may be present in the submitted sample  additional confirmatory testing may be necessary for epidemiological  and / or clinical management purposes  to differentiate between  SARS-CoV-2  and other Sarbecovirus currently known to infect humans.  If clinically indicated additional testing with an alternate test  methodology [phone]) is advised. The SARS-CoV-2 RNA is generally  detectable in upper and lower respiratory sp ecimens during the acute  phase of infection. The expected result is Negative. Fact Sheet for Patients:  StrictlyIdeas.no Fact Sheet for Healthcare Providers: BankingDealers.co.za This test is not yet approved or cleared by the [place] FDA and has been authorized for detection and/or diagnosis of SARS-CoV-2 by FDA under an Emergency Use Authorization (EUA).  This EUA will remain in effect (meaning this test can be used) for the duration of the COVID-19 declaration under Section 564(b)(1) of the Act, 21 U.S.C. section 360bbb-3(b)(1), unless the authorization is terminated or revoked sooner. Performed at [facility], [address] [place], [place] [address]   MRSA PCR Screening     Status: None   Collection Time: 08/05/18  3:59 PM  Result Value Ref Range Status   MRSA by PCR NEGATIVE NEGATIVE Final    Comment:        The GeneXpert MRSA Assay (FDA approved for NASAL specimens only), is one component of a comprehensive MRSA colonization surveillance program. It is not intended to diagnose MRSA infection nor to guide or monitor treatment for MRSA infections. Performed at [facility], [address] [place], [place] [id number]     Coagulation Studies: No results for input(s): LABPROT, INR in the last 72 hours.  Urinalysis: No results for input(s): COLORURINE, LABSPEC, PHURINE, GLUCOSEU, HGBUR, BILIRUBINUR, KETONESUR, PROTEINUR, UROBILINOGEN, NITRITE, LEUKOCYTESUR in the last 72 hours.  Invalid input(s): APPERANCEUR    Imaging: No results found.   Medications:    . aspirin  81 mg Oral Daily  . atorvastatin  10 mg Oral Daily  . ferrous sulfate  325 mg Oral Daily   . fluticasone furoate-vilanterol  1 puff Inhalation Daily   And  . umeclidinium bromide  1 puff Inhalation Daily  . furosemide  20 mg Intravenous Q12H  . heparin  5,000 Units Subcutaneous Q8H  . insulin aspart  0-5 Units Subcutaneous QHS  . insulin aspart  0-9 Units Subcutaneous TID WC  . insulin pump   Subcutaneous Q24H  . mouth rinse  15 mL Mouth Rinse BID  . vitamin B-12  500 mcg Oral Daily   acetaminophen **OR** acetaminophen, ondansetron **OR** ondansetron (ZOFRAN) IV  Assessment/ Plan:  73 y.o. female with medical problems of chronic diastolic congestive heart failure, pulmonary hypertension, chronic respiratory failure on 3 L home oxygen, COPD hyperlipidemia, calcium oxalate kidney stone 09/2017 , was admitted on 08/05/2018 with fluid overload and worsening shortness of breath.   1.  Acute kidney injury Baseline creatinine is normal at 0.64 from June 28, 2018 Presenting creatinine was  elevated at 1.91 AKI likely secondary to acute  hemodynamic instability causing hypotension.   Serum creatinine is back to baseline/normal We will sign off.  Please reconsult as necessary  2. Shortness of breath Multifactorial May use diuretic PRN  3. Hyperkalemia - Agree with shifting measures - Low K Diet - improved   LOS: [place] 6/4/202010:33 AM  [place], [place]  Note: This note was prepared with Dragon dictation. Any transcription errors are unintentional

## 2018-08-08 NOTE — Progress Notes (Signed)
[place] [facility] [place] NAME: Karen [name]    MR#:  [id number]  DATE OF BIRTH:  October 29, 1945  SUBJECTIVE:  CHIEF COMPLAINT:   Chief Complaint  Patient presents with  . Edema  . Shortness of Breath   -Feels much better today.  Off dopamine drip.  Heart rate is stable.  On IV diuresis. -Physically very weak  REVIEW OF SYSTEMS:  Review of Systems  Constitutional: Positive for malaise/fatigue. Negative for chills and fever.  HENT: Negative for congestion, ear discharge, hearing loss and nosebleeds.   Eyes: Negative for blurred vision and double vision.  Respiratory: Positive for shortness of breath. Negative for cough and wheezing.   Cardiovascular: Positive for leg swelling. Negative for chest pain and palpitations.  Gastrointestinal: Negative for abdominal pain, constipation, diarrhea, nausea and vomiting.  Genitourinary: Negative for dysuria.  Musculoskeletal: Negative for myalgias.  Neurological: Negative for dizziness, focal weakness, seizures, weakness and headaches.  Psychiatric/Behavioral: Negative for depression.    DRUG ALLERGIES:   Allergies  Allergen Reactions  . Ace Inhibitors   . Gabapentin Hives  . Lisinopril   . Lyrica [Pregabalin]   . Shrimp [Shellfish Allergy] Swelling    Swelling of the lips    VITALS:  Blood pressure 116/74, pulse 78, temperature 98.2 F (36.8 C), temperature source Oral, resp. rate 18, height 5\' 3"  (1.6 m), weight 102.1 kg, SpO2 91 %.  PHYSICAL EXAMINATION:  Physical Exam  GENERAL:  73 y.o.-year-old obese patient sitting in the bed,not in acute distress EYES: Pupils equal, round, reactive to light and accommodation. No scleral icterus. Extraocular muscles intact.  HEENT: Head atraumatic, normocephalic. Oropharynx and nasopharynx clear.  NECK:  Supple, no jugular venous distention. No thyroid enlargement, no tenderness.  LUNGS: Normal breath sounds bilaterally, no wheezing, rhonchi or  crepitation. No use of accessory muscles of respiration.  Decreased bibasilar breath sounds with fine rales at the bases. CARDIOVASCULAR: S1, S2 normal. No murmurs, rubs, or gallops.  ABDOMEN: Soft, nontender, nondistended. Bowel sounds present. No organomegaly or mass.  EXTREMITIES: No cyanosis, or clubbing.   Improved lower extremity edema noted NEUROLOGIC: Cranial nerves II through XII are intact. Muscle strength 5/5 in all extremities. Sensation intact. Gait not checked.  Global weakness noted PSYCHIATRIC: The patient is alert and oriented x 3.  SKIN: No obvious rash, lesion, or ulcer.    LABORATORY PANEL:   CBC Recent Labs  Lab 08/08/18 0624  WBC 8.7  HGB 11.2*  HCT 35.7*  PLT 242   ------------------------------------------------------------------------------------------------------------------  Chemistries  Recent Labs  Lab 08/05/18 1117  08/08/18 0624  NA 133*   < > 138  K 5.1   < > 4.0  CL 98   < > 97*  CO2 22   < > 31  GLUCOSE 138*   < > 147*  BUN 54*   < > 39*  CREATININE 1.91*   < > 0.69  CALCIUM 8.6*   < > 9.1  MG 2.4  --   --   AST 19  --   --   ALT 15  --   --   ALKPHOS 72  --   --   BILITOT 0.4  --   --    < > = values in this interval not displayed.   ------------------------------------------------------------------------------------------------------------------  Cardiac Enzymes Recent Labs  Lab 08/05/18 1117  TROPONINI <0.03   ------------------------------------------------------------------------------------------------------------------  RADIOLOGY:  No results found.  EKG:   Orders placed or performed during  the hospital encounter of 08/05/18  . EKG 12-Lead  . EKG 12-Lead  . EKG 12-Lead  . EKG 12-Lead  . EKG  . EKG 12-Lead  . EKG 12-Lead    ASSESSMENT AND PLAN:   Karen [name]  is a 73 y.o. female with a known history of chronic respiratory failure secondary to COPD on 3 L home oxygen, diastolic CHF, hypertension, anemia,  osteoarthritis presents to hospital secondary to worsening weight gain and shortness of breath.  1.  Junctional bradycardia-     Asymptomatic without dizziness, syncope or presyncopal episodes. - held bisoprolol, HR in 60's. Off dopamine drip  -monitor electrolytes.  Echocardiogram with LVH, normal EF and elevated pulmonary pressures..  - no plans for pacemaker at this time  2.  Acute on chronic diastolic heart failure- off dopamine drip, BP is improving, started low dose lasix -.  Echocardiogram done bedside showing EF of 50 to 55% but has moderate to severe pulmonary hypertension -Continue daily weights and input and output monitoring -L fluid overloaded.  Continue IV diuresis -Appreciate cardiology consult  3.  Acute renal failure-likely ATN secondary to hypotension.  Also cardiorenal syndrome.  Creatinine improving with IV Lasix -Appreciate nephrology consult. - monitor only at this time.   4.  Hyperkalemia-hold losartan.  received Lokelma given. Continue to monitor - on lasix  5.  Chronic respiratory failure secondary to COPD-stable, on 3 L chronic home oxygen.  Continue inhalers.  6.  Diabetes mellitus-on insulin pump.  Appreciate diabetes coordinator input.  Continue insulin pump.  According to diabetes coordinator notes, delivers 40 units of basal insulin and she takes pre-meal clicks  7.  DVT prophylaxis-on subcu heparin  Independent at baseline, ambulates with a walker PT consulted-might need rehab    All the records are reviewed and case discussed with Care Management/Social Workerr. Management plans discussed with the patient, family and they are in agreement.  CODE STATUS: Full code  TOTAL TIME TAKING CARE OF THIS PATIENT: 36 minutes.   POSSIBLE D/C IN 2 DAYS, DEPENDING ON CLINICAL CONDITION.   Gladstone Lighter M.D on 08/08/2018 at 2:07 PM  Between 7am to 6pm - Pager - [phone]  After 6pm go to [email or web address] - password [email or web address]  [facility]  Office  [phone]  CC: Primary care physician; Ricardo Jericho, NP

## 2018-08-08 NOTE — Progress Notes (Signed)
Progress Note  Patient Name: Karen [name] Date of Encounter: 08/08/2018  Primary Cardiologist: Ida Rogue, MD   Subjective   Legs very weak, reports unable to stand up without assistance Improving shortness of breath, less abdominal swelling, lower extremity edema has resolved Getting closer to baseline  Inpatient Medications    Scheduled Meds: . aspirin  81 mg Oral Daily  . atorvastatin  10 mg Oral Daily  . ferrous sulfate  325 mg Oral Daily  . fluticasone furoate-vilanterol  1 puff Inhalation Daily   And  . umeclidinium bromide  1 puff Inhalation Daily  . furosemide  20 mg Intravenous Q12H  . heparin  5,000 Units Subcutaneous Q8H  . insulin aspart  0-5 Units Subcutaneous QHS  . insulin aspart  0-9 Units Subcutaneous TID WC  . insulin pump   Subcutaneous Q24H  . mouth rinse  15 mL Mouth Rinse BID  . vitamin B-12  500 mcg Oral Daily   Continuous Infusions:  PRN Meds: acetaminophen **OR** acetaminophen, ondansetron **OR** ondansetron (ZOFRAN) IV   Vital Signs    Vitals:   08/07/18 2202 08/08/18 0219 08/08/18 0349 08/08/18 0801  BP: (!) 121/49  (!) 128/56 116/74  Pulse: 80  85 78  Resp: 20  18 18   Temp: 98.4 F (36.9 C)  98.7 F (37.1 C) 98.2 F (36.8 C)  TempSrc: Oral  Oral Oral  SpO2: 96%  95% 94%  Weight:  102.1 kg    Height:        Intake/Output Summary (Last 24 hours) at 08/08/2018 1200 Last data filed at 08/08/2018 0900 Gross per 24 hour  Intake 600 ml  Output 2000 ml  Net -1400 ml   Filed Weights   08/07/18 0500 08/07/18 2157 08/08/18 0219  Weight: 91 kg 101.9 kg 102.1 kg    Telemetry    Normal sinus rhythm personally Reviewed  ECG    No new tracings - Personally Reviewed  Physical Exam   GEN: No acute distress Neck: JVD difficult to assess d/t body habitus Cardiac: RRR, 0-9/8 systolic murmur. No rubs or gallops.  Respiratory: Clear, scattered Rales at the bases GI: Soft, nontender, non-distended  MS:  No lower extremity edema,  good range of motion Neuro:  Nonfocal  Psych: Normal affect   Labs    Chemistry Recent Labs  Lab 08/05/18 1117 08/06/18 0413 08/06/18 1329 08/07/18 0524 08/08/18 0624  NA 133* 131*  --  133* 138  K 5.1 5.6* 6.0* 5.0 4.0  CL 98 95*  --  99 97*  CO2 22 24  --  24 31  GLUCOSE 138* 262*  --  91 147*  BUN 54* 62*  --  69* 39*  CREATININE 1.91* 1.81*  --  1.67* 0.69  CALCIUM 8.6* 8.7*  --  9.1 9.1  PROT 6.6  --   --   --   --   ALBUMIN 3.5  --   --   --   --   AST 19  --   --   --   --   ALT 15  --   --   --   --   ALKPHOS 72  --   --   --   --   BILITOT 0.4  --   --   --   --   GFRNONAA 25* 27*  --  30* >60  GFRAA 29* 31*  --  35* >60  ANIONGAP 13 12  --  10 10  Hematology Recent Labs  Lab 08/06/18 0413 08/07/18 0524 08/08/18 0624  WBC 14.9* 10.5 8.7  RBC 4.18 4.02 4.04  HGB 11.5* 11.2* 11.2*  HCT 36.5 35.2* 35.7*  MCV 87.3 87.6 88.4  MCH 27.5 27.9 27.7  MCHC 31.5 31.8 31.4  RDW 15.9* 16.1* 15.8*  PLT 284 197 242    Cardiac Enzymes Recent Labs  Lab 08/05/18 1117  TROPONINI <0.03   No results for input(s): TROPIPOC in the last 168 hours.   BNP Recent Labs  Lab 08/05/18 1118 08/05/18 1624  BNP 826.0* 831.0*     DDimer No results for input(s): DDIMER in the last 168 hours.   Radiology    No results found.  Cardiac Studies   08/05/2018 Echo  1. The left ventricle has low normal systolic function, with an ejection fraction of 50-55%. The cavity size was normal. There is mild concentric left ventricular hypertrophy. Left ventricular diastolic Doppler parameters are indeterminate.  2. The right ventricle has low normal systolic function. The cavity was mildly enlarged. There is no increase in right ventricular wall thickness. Right ventricular systolic pressure is moderately elevated with an estimated pressure of 60.4 mmHg.  3. Left atrial size was mildly dilated.  4. Right atrial size was mildly dilated.  5. The mitral valve is grossly normal.  6.  The tricuspid valve is grossly normal. Tricuspid valve regurgitation is mild-moderate.  7. The aortic valve is grossly normal. Mild thickening of the aortic valve. Mild calcification of the aortic valve. Aortic valve regurgitation was not assessed by color flow Doppler. Mild stenosis of the aortic valve.  8. The inferior vena cava was dilated in size with <50% respiratory variability.  Patient Profile     73 y.o. 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  Assessment & Plan   Acute on chronic HFpEF / pulmonary HTN Improving shortness of breath Markedly elevated right heart pressures on echocardiogram,severe pulmonary HTN,  BNP at admission 800. -Would continue Lasix IV twice daily at least 1 more day, review of BMP tomorrow Suspect cardiorenal syndrome, creatinine improving  Junctional bradycardia --Continue to hold BB,  Normal sinus rhythm  Hypotension Blood pressure medications held, BP improved  AKI/ARF -Baseline Cr 0.64 06/28/2018.  Suspect cardiorenal syndrome Improvement with diuresis since admission Still above her baseline   Total encounter time more than 25 minutes  Greater than 50% was spent in counseling and coordination of care with the patient      Signed, Ida Rogue, MD  08/08/2018, 12:00 PM

## 2018-08-09 LAB — BASIC METABOLIC PANEL
Anion gap: 10 (ref 5–15)
BUN: 25 mg/dL — ABNORMAL HIGH (ref 8–23)
CO2: 34 mmol/L — ABNORMAL HIGH (ref 22–32)
Calcium: 8.9 mg/dL (ref 8.9–10.3)
Chloride: 96 mmol/L — ABNORMAL LOW (ref 98–111)
Creatinine, Ser: 0.6 mg/dL (ref 0.44–1.00)
GFR calc Af Amer: >60 mL/min (ref >60)
GFR calc non Af Amer: >60 mL/min (ref >60)
Glucose, Bld: 171 mg/dL — ABNORMAL HIGH (ref 70–99)
Potassium: 3.9 mmol/L (ref 3.5–5.1)
Sodium: 140 mmol/L (ref 135–145)

## 2018-08-09 LAB — GLUCOSE, CAPILLARY
Glucose-Capillary: 173 mg/dL — ABNORMAL HIGH (ref 70–99)
Glucose-Capillary: 207 mg/dL — ABNORMAL HIGH (ref 70–99)
Glucose-Capillary: 296 mg/dL — ABNORMAL HIGH (ref 70–99)
Glucose-Capillary: 343 mg/dL — ABNORMAL HIGH (ref 70–99)

## 2018-08-09 MED ORDER — METOPROLOL TARTRATE 5 MG/5ML IV SOLN
INTRAVENOUS | Status: AC
  Filled 2018-08-09: qty 5

## 2018-08-09 MED ORDER — FUROSEMIDE 10 MG/ML IJ SOLN
40.0000 mg | Freq: Once | INTRAMUSCULAR | Status: AC
  Administered 2018-08-09: 40 mg via INTRAVENOUS
  Filled 2018-08-09: qty 4

## 2018-08-09 MED ORDER — DOCUSATE SODIUM 100 MG PO CAPS
100.0000 mg | ORAL_CAPSULE | Freq: Two times a day (BID) | ORAL | Status: DC
  Administered 2018-08-09 – 2018-08-10 (×3): 100 mg via ORAL
  Filled 2018-08-09 (×3): qty 1

## 2018-08-09 MED ORDER — SODIUM CHLORIDE 0.9% FLUSH: 3.0000 mL | INTRAVENOUS | Status: DC | PRN

## 2018-08-09 MED ORDER — SODIUM CHLORIDE 0.9% FLUSH
3.0000 mL | Freq: Two times a day (BID) | INTRAVENOUS | Status: DC
  Administered 2018-08-09 – 2018-08-10 (×3): 3 mL via INTRAVENOUS

## 2018-08-09 MED ORDER — DILTIAZEM HCL 30 MG PO TABS
30.0000 mg | ORAL_TABLET | Freq: Three times a day (TID) | ORAL | Status: DC
  Administered 2018-08-09 – 2018-08-10 (×2): 30 mg via ORAL
  Filled 2018-08-09 (×2): qty 1

## 2018-08-09 MED ORDER — METOPROLOL TARTRATE 5 MG/5ML IV SOLN: 2.5000 mg | INTRAVENOUS | Status: DC | PRN

## 2018-08-09 MED ORDER — BISOPROLOL FUMARATE 5 MG PO TABS
5.0000 mg | ORAL_TABLET | Freq: Every day | ORAL | Status: DC
  Filled 2018-08-09: qty 1

## 2018-08-09 MED ORDER — METOPROLOL TARTRATE 5 MG/5ML IV SOLN
2.5000 mg | Freq: Once | INTRAVENOUS | Status: AC
  Administered 2018-08-09: 2.5 mg via INTRAVENOUS

## 2018-08-09 MED ORDER — FUROSEMIDE 10 MG/ML IJ SOLN
40.0000 mg | Freq: Two times a day (BID) | INTRAMUSCULAR | Status: DC
  Administered 2018-08-09 – 2018-08-10 (×2): 40 mg via INTRAVENOUS
  Filled 2018-08-09 (×2): qty 4

## 2018-08-09 MED ORDER — POTASSIUM CHLORIDE CRYS ER 20 MEQ PO TBCR
20.0000 meq | EXTENDED_RELEASE_TABLET | Freq: Once | ORAL | Status: AC
  Administered 2018-08-09: 20 meq via ORAL
  Filled 2018-08-09: qty 1

## 2018-08-09 MED ORDER — FUROSEMIDE 10 MG/ML IJ SOLN: 40.0000 mg | Freq: Two times a day (BID) | INTRAMUSCULAR | Status: DC

## 2018-08-09 MED ORDER — LOSARTAN POTASSIUM 50 MG PO TABS
100.0000 mg | ORAL_TABLET | Freq: Every day | ORAL | Status: DC
  Administered 2018-08-09 – 2018-08-10 (×2): 100 mg via ORAL
  Filled 2018-08-09 (×2): qty 2

## 2018-08-09 MED ORDER — POLYETHYLENE GLYCOL 3350 17 G PO PACK: 17.0000 g | PACK | Freq: Every day | ORAL | Status: DC | PRN

## 2018-08-09 NOTE — TOC Initial Note (Signed)
Transition of Care [facility]) - Initial/Assessment Note    Patient Details  Name: Karen [name] MRN: [id number] Date of Birth: 1945/09/18  [facility] CM/SW Contact:    Katrina Stack, RN Phone Number: 08/09/2018, 5:45 PM  Clinical Narrative:                Denies issues with transporation that would cause her to miss appointments and denies issues that prevent her from getting her medications.  Physical therapy is recommending skilled nursing and patient gave permission for bed search. Obtained pasarr. Facility preference would be "one in [place]."  She received bed offers from [facility], [facility], [facility], [place]. She is thinking about it and will talk it over with her family.  Skilled nursing stay will require authorization by her insurance   Expected Discharge Plan: (Skilled nursing vs home health) Barriers to Discharge: No Barriers Identified   Patient Goals and CMS Choice Patient states their goals for this hospitalization and ongoing recovery are:: Not to catch to catch the corolla virus CMS Medicare.gov Compare Post Acute Care list provided to:: (Skilled nursing and home health to patient and coies in chart) Choice offered to / list presented to : Patient  Expected Discharge Plan and Services Expected Discharge Plan: (Skilled nursing vs home health)   Discharge Planning Services: CM Consult   Living arrangements for the past 2 months: Single Family Home                                      Prior Living Arrangements/Services Living arrangements for the past 2 months: Single Family Home Lives with:: Spouse, Adult Children Patient language and need for interpreter reviewed:: Yes Do you feel safe going back to the place where you live?: Yes      Need for Family Participation in Patient Care: Yes (Comment) Care giver support system in place?: Yes (comment) Current home services: DME(Chronic 02 with [facility]) Criminal  Activity/Legal Involvement Pertinent to Current Situation/Hospitalization: No - Comment as needed  Activities of Daily Living Home Assistive Devices/Equipment: Walker (specify type), Oxygen, Cane (specify quad or straight) ADL Screening (condition at time of admission) Patient's cognitive ability adequate to safely complete daily activities?: Yes Is the patient deaf or have difficulty hearing?: No Does the patient have difficulty seeing, even when wearing glasses/contacts?: No Does the patient have difficulty concentrating, remembering, or making decisions?: No Patient able to express need for assistance with ADLs?: Yes Does the patient have difficulty dressing or bathing?: No Independently performs ADLs?: Yes (appropriate for developmental age) Does the patient have difficulty walking or climbing stairs?: Yes Weakness of Legs: None Weakness of Arms/Hands: Both  Permission Sought/Granted Permission sought to share information with : Case Manager, Other (comment)(Agency contacts that may be providing care and services) Permission granted to share information with : Yes, Verbal Permission Granted              Emotional Assessment Appearance:: Appears stated age Attitude/Demeanor/Rapport: Gracious Affect (typically observed): Calm, Accepting Orientation: : Oriented to Self, Oriented to Place, Oriented to  Time, Oriented to Situation Alcohol / Substance Use: Not Applicable Psych Involvement: No (comment)  Admission diagnosis:  Junctional bradycardia [R00.1] Acute on chronic combined systolic and diastolic congestive heart failure (HCC) [I50.43] AKI (acute kidney injury) ([place]) [N17.9] Patient Active Problem List   Diagnosis Date Noted  . Junctional bradycardia   .  Acute on chronic heart failure with preserved ejection fraction (HFpEF) ([place])   . AKI (acute kidney injury) ([place])   . Symptomatic bradycardia 08/05/2018  . Acute on chronic respiratory failure ([place]) 06/25/2018  .  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 diabetes mellitus ([place]) 01/25/2018  . History of non anemic vitamin B12 deficiency 01/25/2018  . Personal history of kidney stones 11/12/2017  . Urge incontinence 11/12/2017  . Right ureteral stone 09/15/2017  . Acute GI bleeding   . GI bleed 08/26/2017  . Arthritis 08/10/2017  . Chronic kidney disease 08/10/2017  . COPD (chronic obstructive pulmonary disease) ([place]) 08/10/2017  . Diabetes mellitus type 2, uncomplicated ([place]) 03/55/9741  . Hypertension 08/10/2017  . Obesity (BMI 35.0-39.9 without comorbidity) 04/11/2017  . Primary osteoarthritis of right knee 09/01/2016  . Leucocytosis 10/19/2015  . 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 Acute on chronic respiratory failure with hypoxia and hypercapnia ([place]) 01/07/2015  . Sciatica 01/07/2015  . Weakness 01/07/2015  . 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 Iron deficiency anemia 06/22/2014  . Microalbuminuria 06/22/2014  . CHF (congestive heart failure) ([place]) 04/20/2014  . Edema, peripheral 04/20/2014   PCP:  Ricardo Jericho, NP Pharmacy:   [facility] [address] [place] [address] Phone: [phone] Fax: [phone]     Social Determinants of Health (SDOH) Interventions    Readmission Risk Interventions Readmission Risk Prevention Plan 08/09/2018 06/26/2018  Transportation Screening Complete Complete  PCP or Specialist Appt within 5-7 Days - Complete  Home Care Screening - Complete  Medication Review (RN CM) - Complete  Palliative Care Screening Not Applicable -  Medication Review (RN Care Manager) Complete -  Some recent data might be hidden

## 2018-08-09 NOTE — Progress Notes (Signed)
Physical Therapy Treatment Patient Details Name: Karen [name] MRN: [id number] DOB: Nov 16, 1945 Today's Date: 08/09/2018    History of Present Illness Patient is a pleasant 73 y/o female with history of COPD, chronic respiratory failure on 3L admitted for edema and shortness of breath.     PT Comments    Pt in bed, ready to get up.  To edge of bed with rail and increased time.  Able to sit unsupported and EOB.  Stood with min guard and was able to walk 20' with slow hesitant gait.  After seated rest, walked 18' back to recliner.  Pt on 3 lpm during session with O2 and HR stable.  Pt limited by fatigue and LE weakness during session.    Follow Up Recommendations  SNF     Equipment Recommendations  Rolling walker with 5" wheels    Recommendations for Other Services       Precautions / Restrictions Precautions Precautions: Fall Restrictions Weight Bearing Restrictions: No    Mobility  Bed Mobility Overal bed mobility: Needs Assistance Bed Mobility: Supine to Sit     Supine to sit: Min guard        Transfers Overall transfer level: Needs assistance Equipment used: Rolling walker (2 wheeled) Transfers: Sit to/from Stand Sit to Stand: Min assist            Ambulation/Gait Ambulation/Gait assistance: Min guard;Min assist Gait Distance (Feet): 20 Feet Assistive device: Rolling walker (2 wheeled) Gait Pattern/deviations: Step-through pattern;Decreased step length - right;Decreased step length - left Gait velocity: decreased   General Gait Details: 16' then 18' after seated rest  limited by fatigue and LE weakness   Stairs             Wheelchair Mobility    Modified Rankin (Stroke Patients Only)       Balance Overall balance assessment: Needs assistance Sitting-balance support: Feet supported Sitting balance-Leahy Scale: Good     Standing balance support: Bilateral upper extremity supported Standing balance-Leahy Scale: Fair Standing balance  comment: heavy reliance on walker                            Cognition Arousal/Alertness: Awake/alert Behavior During Therapy: WFL for tasks assessed/performed Overall Cognitive Status: Within Functional Limits for tasks assessed                                        Exercises      General Comments        Pertinent Vitals/Pain Pain Assessment: Faces Faces Pain Scale: Hurts a little bit Pain Location: shoulder Pain Descriptors / Indicators: Sore Pain Intervention(s): Limited activity within patient's tolerance;Monitored during session;Repositioned    Home Living                      Prior Function            PT Goals (current goals can now be found in the care plan section) Progress towards PT goals: Progressing toward goals    Frequency    Min 2X/week      PT Plan Current plan remains appropriate    Co-evaluation              AM-PAC PT "6 Clicks" Mobility   Outcome Measure  Help needed turning from your back to your side while in a flat  bed without using bedrails?: A Little Help needed moving from lying on your back to sitting on the side of a flat bed without using bedrails?: A Little Help needed moving to and from a bed to a chair (including a wheelchair)?: A Little Help needed standing up from a chair using your arms (e.g., wheelchair or bedside chair)?: A Little Help needed to walk in hospital room?: A Lot Help needed climbing 3-5 steps with a railing? : Total 6 Click Score: 15    End of Session Equipment Utilized During Treatment: Gait belt Activity Tolerance: Patient tolerated treatment well;Patient limited by fatigue Patient left: with chair alarm set;in chair;with call bell/phone within reach         Time: 2119-4174 PT Time Calculation (min) (ACUTE ONLY): 17 min  Charges:  $Gait Training: 8-22 mins                     Chesley Noon, PTA 08/09/18, 11:11 AM

## 2018-08-09 NOTE — Plan of Care (Signed)
  Problem: Health Behavior/Discharge Planning: Goal: Ability to manage health-related needs will improve Outcome: Progressing   Problem: Activity: Goal: Risk for activity intolerance will decrease Outcome: Progressing   Problem: Pain Managment: Goal: General experience of comfort will improve Outcome: Progressing   Problem: Safety: Goal: Ability to remain free from injury will improve Outcome: Progressing   

## 2018-08-09 NOTE — NC FL2 (Signed)
[place] LEVEL OF CARE SCREENING TOOL     IDENTIFICATION  Patient Name: Karen [name] Birthdate: 1945/07/23 Sex: female Admission Date (Current Location): 08/05/2018  [place] and [place] Number:  Engineering geologist and Address:  [facility], [address] [place],  [address]      Provider Number: [id number]  Attending Physician Name and Address:  Gladstone Lighter, MD  Relative Name and Phone Number:       Current Level of Care: Hospital Recommended Level of Care: [place] Prior Approval Number:    Date Approved/Denied:   PASRR Number: [id number]  Discharge Plan: SNF    Current Diagnoses: Patient Active Problem List   Diagnosis Date Noted  . Junctional bradycardia   . Acute on chronic heart failure with preserved ejection fraction (HFpEF) ([place])   . AKI (acute kidney injury) ([place])   . Symptomatic bradycardia 08/05/2018  . Acute on chronic respiratory failure ([place]) 06/25/2018  . 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 diabetes mellitus ([place]) 01/25/2018  . History of non anemic vitamin B12 deficiency 01/25/2018  . Personal history of kidney stones 11/12/2017  . Urge incontinence 11/12/2017  . Right ureteral stone 09/15/2017  . Acute GI bleeding   . GI bleed 08/26/2017  . Arthritis 08/10/2017  . Chronic kidney disease 08/10/2017  . COPD (chronic obstructive pulmonary disease) ([place]) 08/10/2017  . Diabetes mellitus type 2, uncomplicated ([place]) 44/96/7591  . Hypertension 08/10/2017  . Obesity (BMI 35.0-39.9 without comorbidity) 04/11/2017  . Primary osteoarthritis of right knee 09/01/2016  . Leucocytosis 10/19/2015  . 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 Acute on chronic respiratory failure with hypoxia and hypercapnia ([place]) 01/07/2015  . Sciatica 01/07/2015  . Weakness 01/07/2015  . 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 Iron deficiency anemia 06/22/2014  .  Microalbuminuria 06/22/2014  . CHF (congestive heart failure) ([place]) 04/20/2014  . Edema, peripheral 04/20/2014    Orientation RESPIRATION BLADDER Height & Weight     Time, Situation, Place, Self  O2(Chronic) Continent Weight: 99.4 kg Height:  5\' 3"  (160 cm)  BEHAVIORAL SYMPTOMS/MOOD NEUROLOGICAL BOWEL NUTRITION STATUS    (none)   Diet  AMBULATORY STATUS COMMUNICATION OF NEEDS Skin   Limited Assist   Normal                       Personal Care Assistance Level of Assistance  Bathing, Total care, Dressing Bathing Assistance: Limited assistance   Dressing Assistance: Limited assistance Total Care Assistance: Limited assistance   Functional Limitations Info  (none reported)          SPECIAL CARE FACTORS FREQUENCY  PT (By licensed PT), OT (By licensed OT)     PT Frequency: 5-7 times a week OT Frequency: 5-7 times a week            Contractures Contractures Info: Not present    Additional Factors Info  Code Status Code Status Info: full             Current Medications (08/09/2018):  This is the current hospital active medication list Current Facility-Administered Medications  Medication Dose Route Frequency Provider Last Rate Last Dose  . acetaminophen (TYLENOL) tablet 650 mg  650 mg Oral Q6H PRN Gladstone Lighter, MD   650 mg at 08/09/18 0948   Or  . acetaminophen (TYLENOL) suppository 650 mg  650 mg Rectal Q6H PRN Gladstone Lighter, MD      . aspirin chewable tablet  81 mg  81 mg Oral Daily Gladstone Lighter, MD   81 mg at 08/09/18 0928  . atorvastatin (LIPITOR) tablet 10 mg  10 mg Oral Daily Gladstone Lighter, MD   10 mg at 08/09/18 0928  . docusate sodium (COLACE) capsule 100 mg  100 mg Oral BID Gladstone Lighter, MD   100 mg at 08/09/18 1225  . ferrous sulfate tablet 325 mg  325 mg Oral Daily Gladstone Lighter, MD   325 mg at 08/09/18 0927  . fluticasone furoate-vilanterol (BREO ELLIPTA) 100-25 MCG/INH 1 puff  1 puff Inhalation Daily Gladstone Lighter, MD   1 puff at 08/09/18 0917   And  . umeclidinium bromide (INCRUSE ELLIPTA) 62.5 MCG/INH 1 puff  1 puff Inhalation Daily Gladstone Lighter, MD   1 puff at 08/09/18 0917  . furosemide (LASIX) injection 40 mg  40 mg Intravenous BID [name], MD      . heparin injection 5,000 Units  5,000 Units Subcutaneous Q8H Gladstone Lighter, MD   5,000 Units at 08/09/18 1517  . insulin aspart (novoLOG) injection 0-5 Units  0-5 Units Subcutaneous QHS Gladstone Lighter, MD   2 Units at 08/06/18 2257  . insulin aspart (novoLOG) injection 0-9 Units  0-9 Units Subcutaneous TID WC Gladstone Lighter, MD   3 Units at 08/09/18 1150  . insulin pump   Subcutaneous Q24H Flora Lipps, MD      . losartan (COZAAR) tablet 100 mg  100 mg Oral Daily [name], MD   100 mg at 08/09/18 1225  . MEDLINE mouth rinse  15 mL Mouth Rinse BID Ottie Glazier, MD   15 mL at 08/09/18 0932  . ondansetron (ZOFRAN) tablet 4 mg  4 mg Oral Q6H PRN Gladstone Lighter, MD       Or  . ondansetron (ZOFRAN) injection 4 mg  4 mg Intravenous Q6H PRN Gladstone Lighter, MD   4 mg at 08/06/18 1119  . polyethylene glycol (MIRALAX / GLYCOLAX) packet 17 g  17 g Oral Daily PRN Gladstone Lighter, MD      . sodium chloride flush (NS) 0.9 % injection 3 mL  3 mL Intravenous Q12H Gladstone Lighter, MD   3 mL at 08/09/18 0942  . sodium chloride flush (NS) 0.9 % injection 3 mL  3 mL Intravenous PRN Gladstone Lighter, MD      . vitamin B-12 (CYANOCOBALAMIN) tablet 500 mcg  500 mcg Oral Daily Gladstone Lighter, MD   500 mcg at 08/09/18 6256     Discharge Medications: Please see discharge summary for a list of discharge medications.  Relevant Imaging Results:  Relevant Lab Results:   Additional Information [phone]  Katrina Stack, RN

## 2018-08-09 NOTE — Plan of Care (Signed)
Nutrition Education Note  RD consulted for nutrition education regarding CHF.  73 y/o female with DM and CHF  Spoke with pt via phone. RD familiar with this pt from recent previous admit in April. Pt was provided with CHF diet education during that admi. RD reviewed CHF diet education today with pt.   RD provided "Low Sodium Nutrition Therapy" handout from the Academy of [employer] via phone. Reviewed patient's dietary recall. Provided examples on ways to decrease sodium intake in diet. Discouraged intake of processed foods and use of salt shaker. Encouraged fresh fruits and vegetables as well as whole grain sources of carbohydrates to maximize fiber intake.   RD discussed why it is important for patient to adhere to diet recommendations, and emphasized the role of fluids, foods to avoid, and importance of weighing self daily. Teach back method used.  Expect poor compliance.  Body mass index is 38.82 kg/m. Pt meets criteria for obesity based on current BMI.  Current diet order is HH, patient is consuming approximately 100% of meals at this time. Labs and medications reviewed. No further nutrition interventions warranted at this time. RD contact information provided. If additional nutrition issues arise, please re-consult RD.   Koleen Distance MS, RD, LDN Pager #- [phone] Office#- [phone] After Hours Pager: [phone]

## 2018-08-09 NOTE — Progress Notes (Signed)
At about 1830 she went into ST with rate up to 140's.  BP stable, asymptomatic.  Contacted Dr. Darvin Neighbours. Gave Metoprolol 2.5 mg IV. Within 3-4 minutes she slowed to 70's in NSR.  10 minutes or so later she converted to Afib with rate from 100 - 120.     BP still stable and asymptomatic.  10 - 15 minutes later she returned to NSR.  Dr. Rockey Situ has been here to see her and adjusted her meds.

## 2018-08-09 NOTE — Progress Notes (Signed)
Progress Note  Patient Name: Karen [name] Date of Encounter: 08/09/2018  Primary Cardiologist: Ida Rogue, MD   Subjective   Continues to feel better today. Denies chest pain. Reported improved shortness of breath. Still does not feel as if at baseline. Continues to report lower extremity edema as improved but c/o abdominal bloating. Of note, she has not had a bowel movement in several days.  Ambulated some yesterday but stated she still feels as if her legs are weak and wants to be stronger and out of bed more often.  Inpatient Medications    Scheduled Meds: . aspirin  81 mg Oral Daily  . atorvastatin  10 mg Oral Daily  . ferrous sulfate  325 mg Oral Daily  . fluticasone furoate-vilanterol  1 puff Inhalation Daily   And  . umeclidinium bromide  1 puff Inhalation Daily  . furosemide  40 mg Intravenous Q12H  . heparin  5,000 Units Subcutaneous Q8H  . insulin aspart  0-5 Units Subcutaneous QHS  . insulin aspart  0-9 Units Subcutaneous TID WC  . insulin pump   Subcutaneous Q24H  . mouth rinse  15 mL Mouth Rinse BID  . vitamin B-12  500 mcg Oral Daily   Continuous Infusions:  PRN Meds: acetaminophen **OR** acetaminophen, ondansetron **OR** ondansetron (ZOFRAN) IV   Vital Signs    Vitals:   08/08/18 1933 08/09/18 0520 08/09/18 0642 08/09/18 0822  BP: (!) 155/60 (!) 159/71  (!) 155/70  Pulse: 87 (!) 103  96  Resp: 20 20  (!) 21  Temp: 98.7 F (37.1 C) 98.4 F (36.9 C)  98 F (36.7 C)  TempSrc: Oral Oral  Axillary  SpO2: 93% 91%  90%  Weight:   99.4 kg   Height:        Intake/Output Summary (Last 24 hours) at 08/09/2018 0857 Last data filed at 08/09/2018 0220 Gross per 24 hour  Intake 600 ml  Output 700 ml  Net -100 ml   Filed Weights   08/07/18 2157 08/08/18 0219 08/09/18 0642  Weight: 101.9 kg 102.1 kg 99.4 kg    Telemetry    Junctional rhythm 40s-80s, PVCs yesterday and overnight with conversion to SR early 6/3 AM - Personally Reviewed  ECG     No new tracings - Personally Reviewed  Physical Exam   GEN: No acute distress. Morbidly obese female. Neck: JVD difficult to assess d/t body habitus Cardiac: RRR, 2/6 systolic murmur. No rubs or gallops.  Respiratory: Distant lung sounds, slightly reduced bibasilar lung sounds GI: Soft, nontender, non-distended  MS: No lower extremity edema; No deformity. Neuro:  Nonfocal  Psych: Normal affect   Labs    Chemistry Recent Labs  Lab 08/05/18 1117  08/07/18 0524 08/08/18 0624 08/09/18 0536  NA 133*   < > 133* 138 140  K 5.1   < > 5.0 4.0 3.9  CL 98   < > 99 97* 96*  CO2 22   < > 24 31 34*  GLUCOSE 138*   < > 91 147* 171*  BUN 54*   < > 69* 39* 25*  CREATININE 1.91*   < > 1.67* 0.69 0.60  CALCIUM 8.6*   < > 9.1 9.1 8.9  PROT 6.6  --   --   --   --   ALBUMIN 3.5  --   --   --   --   AST 19  --   --   --   --   ALT  15  --   --   --   --   ALKPHOS 72  --   --   --   --   BILITOT 0.4  --   --   --   --   GFRNONAA 25*   < > 30* >60 >60  GFRAA 29*   < > 35* >60 >60  ANIONGAP 13   < > 10 10 10    < > = values in this interval not displayed.     Hematology Recent Labs  Lab 08/06/18 0413 08/07/18 0524 08/08/18 0624  WBC 14.9* 10.5 8.7  RBC 4.18 4.02 4.04  HGB 11.5* 11.2* 11.2*  HCT 36.5 35.2* 35.7*  MCV 87.3 87.6 88.4  MCH 27.5 27.9 27.7  MCHC 31.5 31.8 31.4  RDW 15.9* 16.1* 15.8*  PLT 284 197 242    Cardiac Enzymes Recent Labs  Lab 08/05/18 1117  TROPONINI <0.03   No results for input(s): TROPIPOC in the last 168 hours.   BNP Recent Labs  Lab 08/05/18 1118 08/05/18 1624  BNP 826.0* 831.0*     DDimer No results for input(s): DDIMER in the last 168 hours.   Radiology    No results found.  Cardiac Studies   08/05/2018 Echo  1. The left ventricle has low normal systolic function, with an ejection fraction of 50-55%. The cavity size was normal. There is mild concentric left ventricular hypertrophy. Left ventricular diastolic Doppler parameters are  indeterminate.  2. The right ventricle has low normal systolic function. The cavity was mildly enlarged. There is no increase in right ventricular wall thickness. Right ventricular systolic pressure is moderately elevated with an estimated pressure of 60.4 mmHg.  3. Left atrial size was mildly dilated.  4. Right atrial size was mildly dilated.  5. The mitral valve is grossly normal.  6. The tricuspid valve is grossly normal. Tricuspid valve regurgitation is mild-moderate.  7. The aortic valve is grossly normal. Mild thickening of the aortic valve. Mild calcification of the aortic valve. Aortic valve regurgitation was not assessed by color flow Doppler. Mild stenosis of the aortic valve.  8. The inferior vena cava was dilated in size with <50% respiratory variability.  Patient Profile     73 y.o. 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  Assessment & Plan   Acute on chronic HFpEF / pulmonary HTN --Still somewhat volume overloaded but not yet at baseline per patient. Improved SOB.  --Echocardiogram above EF 50 to 55%, LVH, mild LAE/RAE, mild TR/AS, pulmonary HTN.   --Continue IV diuresis. Increased dose to IV 40mg  lasix, per MD. Titration as needed for adequate output and until euvolemic on exam. Daily BMET. Continue to monitor daily weights, strict I's/O's. Uncertain if current I/O accurate. Weight 102.5kg  99.4kg. Will need transitioned to oral diuresis before discharge. --Renal function continues to improve with diuresis as well as improved HR/BP s/p BB washout. Daily BMET. Cr 1.81  0.60; BUN 62  25.  --Consider restarting ARB, given improved renal function / resolution of hyperkalemia and with increased BP today. --Continue  ambulation as tolerated by patient.  Junctional bradycardia, resolved --Resolved s/p BB washout. Weaned off of dopamine. No indication for pacemaker.   Hypertension --Consider ARB restart. Earlier bradycardia, hypotension, AKI, and hyperkalemia resolved s/p  BB washout and with diuresis. BP elevated overnight and today.   HLD --Continue statin.  AKI, resolved -At baseline creatinine as of today. Renal function improved with diuresis and improved HR/BP s/p BB washout. --Consider restarting ARB d/t elevated pressures overnight and today. --Nephrology following.  DM2 --SSI, Per IM  Morbid obesity  - Lifestyle changes recommended.  Hypothyroid -TSH mildly elevated at admission.  Consider hypothyroidism as contributing to some of her worsening heart failure.  For questions or updates, please contact [place] Please consult [email or web address] for contact info under        Signed, Arvil Chaco, PA-C  08/09/2018, 8:57 AM

## 2018-08-09 NOTE — Progress Notes (Signed)
[place] at [place] NAME: Karen [name]    MR#:  [id number]  DATE OF BIRTH:  August 30, 1945  SUBJECTIVE:  CHIEF COMPLAINT:   Chief Complaint  Patient presents with  . Edema  . Shortness of Breath   -Breathing is much improved.  Remains on 3 L oxygen which is her chronic O2.  Feels extremely weak.  PT recommended rehab  REVIEW OF SYSTEMS:  Review of Systems  Constitutional: Positive for malaise/fatigue. Negative for chills and fever.  HENT: Negative for congestion, ear discharge, hearing loss and nosebleeds.   Eyes: Negative for blurred vision and double vision.  Respiratory: Positive for shortness of breath. Negative for cough and wheezing.   Cardiovascular: Positive for leg swelling. Negative for chest pain and palpitations.  Gastrointestinal: Positive for constipation. Negative for abdominal pain, diarrhea, nausea and vomiting.  Genitourinary: Negative for dysuria.  Musculoskeletal: Negative for myalgias.  Neurological: Negative for dizziness, focal weakness, seizures, weakness and headaches.  Psychiatric/Behavioral: Negative for depression.    DRUG ALLERGIES:   Allergies  Allergen Reactions  . Ace Inhibitors   . Gabapentin Hives  . Lisinopril   . Lyrica [Pregabalin]   . Shrimp [Shellfish Allergy] Swelling    Swelling of the lips    VITALS:  Blood pressure (!) 155/70, pulse 96, temperature 98 F (36.7 C), temperature source Axillary, resp. rate (!) 21, height 5\' 3"  (1.6 m), weight 99.4 kg, SpO2 90 %.  PHYSICAL EXAMINATION:  Physical Exam  GENERAL:  73 y.o.-year-old obese patient sitting in the bed,not in acute distress EYES: Pupils equal, round, reactive to light and accommodation. No scleral icterus. Extraocular muscles intact.  HEENT: Head atraumatic, normocephalic. Oropharynx and nasopharynx clear.  NECK:  Supple, no jugular venous distention. No thyroid enlargement, no tenderness.  LUNGS: Normal breath sounds  bilaterally, no wheezing, rhonchi or crepitation. No use of accessory muscles of respiration.  Decreased bibasilar breath sounds with fine rales at the bases. CARDIOVASCULAR: S1, S2 normal. No murmurs, rubs, or gallops.  ABDOMEN: Soft, nontender, nondistended. Bowel sounds present. No organomegaly or mass.  EXTREMITIES: No cyanosis, or clubbing.   Improved lower extremity edema noted NEUROLOGIC: Cranial nerves II through XII are intact. Muscle strength 5/5 in all extremities. Sensation intact. Gait not checked.  Global weakness noted PSYCHIATRIC: The patient is alert and oriented x 3.  SKIN: No obvious rash, lesion, or ulcer.    LABORATORY PANEL:   CBC Recent Labs  Lab 08/08/18 0624  WBC 8.7  HGB 11.2*  HCT 35.7*  PLT 242   ------------------------------------------------------------------------------------------------------------------  Chemistries  Recent Labs  Lab 08/05/18 1117  08/09/18 0536  NA 133*   < > 140  K 5.1   < > 3.9  CL 98   < > 96*  CO2 22   < > 34*  GLUCOSE 138*   < > 171*  BUN 54*   < > 25*  CREATININE 1.91*   < > 0.60  CALCIUM 8.6*   < > 8.9  MG 2.4  --   --   AST 19  --   --   ALT 15  --   --   ALKPHOS 72  --   --   BILITOT 0.4  --   --    < > = values in this interval not displayed.   ------------------------------------------------------------------------------------------------------------------  Cardiac Enzymes Recent Labs  Lab 08/05/18 1117  TROPONINI <0.03   ------------------------------------------------------------------------------------------------------------------  RADIOLOGY:  No results found.  EKG:  Orders placed or performed during the hospital encounter of 08/05/18  . EKG 12-Lead  . EKG 12-Lead  . EKG 12-Lead  . EKG 12-Lead  . EKG  . EKG 12-Lead  . EKG 12-Lead    ASSESSMENT AND PLAN:   Karen [name]  is a 73 y.o. female with a known history of chronic respiratory failure secondary to COPD on 3 L home oxygen,  diastolic CHF, hypertension, anemia, osteoarthritis presents to hospital secondary to worsening weight gain and shortness of breath.  1.  Junctional bradycardia on admission-     Asymptomatic without dizziness, syncope or presyncopal episodes. - held bisoprolol, HR in 60's. Off dopamine drip  -monitor electrolytes.  Echocardiogram with LVH, normal EF and elevated pulmonary pressures..  - no plans for pacemaker at this time  2.  Acute on chronic diastolic heart failure- off dopamine drip, BP is improvied, - started on Lasix with improvement.  Increase the dose to 40 IV twice daily today. -.  Echocardiogram done bedside showing EF of 50 to 55% but has moderate to severe pulmonary hypertension -Continue daily weights and input and output monitoring -Still fluid overloaded.  Continue IV diuresis for another 1 to 2 days -Appreciate cardiology consult  3.  Acute renal failure-likely ATN secondary to hypotension.  Also cardiorenal syndrome.  Creatinine improving with IV Lasix -Appreciate nephrology consult. - monitor  at this time.   4.  Chronic respiratory failure secondary to COPD-stable, on 3 L chronic home oxygen.  Continue inhalers.  6.  Diabetes mellitus-on insulin pump.  Appreciate diabetes coordinator input.  Continue insulin pump.  According to diabetes coordinator notes, delivers 40 units of basal insulin and she takes pre-meal clicks  7.  DVT prophylaxis-on subcu heparin  Independent at baseline, ambulates with a walker PT consulted-will need rehab    All the records are reviewed and case discussed with Care Management/Social Workerr. Management plans discussed with the patient, family and they are in agreement.  CODE STATUS: Full code  TOTAL TIME TAKING CARE OF THIS PATIENT: 37 minutes.   POSSIBLE D/C IN 2 DAYS, DEPENDING ON CLINICAL CONDITION.   Gladstone Lighter M.D on 08/09/2018 at 12:08 PM  Between 7am to 6pm - Pager - [phone]  After 6pm go to  [email or web address] - password [email or web address] [place] [facility]  Office  [phone]  CC: Primary care physician; Ricardo Jericho, NP

## 2018-08-10 LAB — BASIC METABOLIC PANEL
Anion gap: 9 (ref 5–15)
BUN: 28 mg/dL — ABNORMAL HIGH (ref 8–23)
CO2: 34 mmol/L — ABNORMAL HIGH (ref 22–32)
Calcium: 8.7 mg/dL — ABNORMAL LOW (ref 8.9–10.3)
Chloride: 94 mmol/L — ABNORMAL LOW (ref 98–111)
Creatinine, Ser: 0.74 mg/dL (ref 0.44–1.00)
GFR calc Af Amer: >60 mL/min (ref >60)
GFR calc non Af Amer: >60 mL/min (ref >60)
Glucose, Bld: 196 mg/dL — ABNORMAL HIGH (ref 70–99)
Potassium: 3.8 mmol/L (ref 3.5–5.1)
Sodium: 137 mmol/L (ref 135–145)

## 2018-08-10 LAB — GLUCOSE, CAPILLARY
Glucose-Capillary: 202 mg/dL — ABNORMAL HIGH (ref 70–99)
Glucose-Capillary: 263 mg/dL — ABNORMAL HIGH (ref 70–99)

## 2018-08-10 MED ORDER — FUROSEMIDE 40 MG PO TABS: 40.0000 mg | ORAL_TABLET | Freq: Two times a day (BID) | ORAL | Status: DC

## 2018-08-10 MED ORDER — FUROSEMIDE 40 MG PO TABS: 40.0000 mg | ORAL_TABLET | Freq: Two times a day (BID) | ORAL | 0 refills | Status: DC

## 2018-08-10 MED ORDER — CYCLOBENZAPRINE HCL 10 MG PO TABS: 10.0000 mg | ORAL_TABLET | Freq: Three times a day (TID) | ORAL | Status: DC | PRN

## 2018-08-10 NOTE — Discharge Summary (Signed)
No Name at [place] NAME: Karen [name]    MR#:  [id number]  DATE OF BIRTH:  03/18/1945  DATE OF ADMISSION:  08/05/2018 ADMITTING PHYSICIAN: Gladstone Lighter, MD  DATE OF DISCHARGE: 08/10/2018  PRIMARY CARE PHYSICIAN: Ricardo Jericho, NP    ADMISSION DIAGNOSIS:  Junctional bradycardia [R00.1] Acute on chronic combined systolic and diastolic congestive heart failure (HCC) [I50.43] AKI (acute kidney injury) ([place]) [N17.9]  DISCHARGE DIAGNOSIS:  Acute on chronic diastolic CHF--improved Junctional Bradycardia--resolved (currently off BB)  SECONDARY DIAGNOSIS:   Past Medical History:  Diagnosis Date  . (HFpEF) heart failure with preserved ejection fraction ([place]) 2017   (1) TTE 2017 a. EF 50% b. mild LVH c. mild MR/TR, d. mild LAE e. mild pulmonary HTN  (2) TTE 06/2018 a. EF 50-55%, mild AS with thickening of valve   . Acute on chronic respiratory failure with hypoxia and hypercapnia ([place]) 01/07/2015  . Anemia   . 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 Asthma   . Cataract   . Chronic kidney disease 08/10/2017  . CKD (chronic kidney disease)   . COPD (chronic obstructive pulmonary disease) ([place])    (1) 06/2018 tobacco use, home 3L oxygen   . Diabetes mellitus without complication (HCC)    (1) A1C 7.7 (06/2018)  . Edema, peripheral 04/20/2014  . History of kidney stones   . Hyperlipidemia   . Hypertension   . Iron deficiency anemia 06/22/2014  . Leucocytosis 10/19/2015  . Overactive bladder   . Primary osteoarthritis of right knee 09/01/2016  . Renal insufficiency   . Sciatica 01/07/2015    HOSPITAL COURSE:   [name] a72 y.o.femalewith a known history of chronic respiratory failure secondary to COPD on 3 L home oxygen, diastolic CHF, hypertension, anemia, osteoarthritis presents to hospital secondary to worsening weight gain and shortness of breath.  1. Junctional bradycardia on admission-    Asymptomatic without  dizziness, syncope or presyncopal episodes. - held bisoprolol, HR in 60's. Off dopamine drip  -monitor electrolytes. Echocardiogram with LVH, normal EF and elevated pulmonary pressures..  - no plans for pacemaker at this time  2. Acute on chronic diastolic heart failure- off dopamine drip, BP is improvied, - started on Lasix with improvement.  -Echocardiogram done bedside showing EF of 50 to 55% but has moderate to severe pulmonary hypertension -Continue daily weights and input and output monitoring -changed to po lasix 40 mg bid By cardiology. Pt is negative by 2.6 liters. On chornic 3 liter [place]. Pt feels ast baseline with sob -resumed losartan  3. Acute renal failure-likely ATN secondary to hypotension.  Also cardiorenal syndrome.  Creatinine improving with IV Lasix--now changed to po 40 mg bid By dr Roselind Messier this dose -Appreciate nephrology consult. - creat 0.7  4. Chronic respiratory failure secondary to COPD-stable, on 3 L chronic home oxygen. Continue inhalers.  6.  Diabetes mellitus-on insulin pump.  Appreciate diabetes coordinator input.  Continue insulin pump.  According to diabetes coordinator notes, delivers 40 units of basal insulin and she takes pre-meal clicks  7. DVT prophylaxis-on subcu heparin  Independent at baseline, ambulates with a walker PT consulted-will need rehab. Pt is adamant and wants to go home. She has her husband and son (currently out of job to help her)  Will get HHPT and CHF nursing Pt is already enrolled at CHF clinic She has no issues obtaining her meds.  D/c home today    CONSULTS OBTAINED:  Treatment Team:  Wellington Hampshire, MD  DRUG ALLERGIES:   Allergies  Allergen Reactions  . Ace Inhibitors   . Gabapentin Hives  . Lisinopril   . Lyrica [Pregabalin]   . Shrimp [Shellfish Allergy] Swelling    Swelling of the lips    DISCHARGE MEDICATIONS:   Allergies as of 08/10/2018      Reactions   Ace Inhibitors     Gabapentin Hives   Lisinopril    Lyrica [pregabalin]    Shrimp [shellfish Allergy] Swelling   Swelling of the lips      Medication List    STOP taking these medications   bisoprolol 5 MG tablet Commonly known as:  ZEBETA     TAKE these medications   aspirin 81 MG chewable tablet Chew 81 mg by mouth daily. On hold   atorvastatin 10 MG tablet Commonly known as:  LIPITOR Take 1 tablet by mouth daily.   Breo Ellipta 100-25 MCG/INH Aepb Generic drug:  fluticasone furoate-vilanterol Inhale 1 puff into the lungs daily.   cyclobenzaprine 10 MG tablet Commonly known as:  FLEXERIL Take 1 tablet (10 mg total) by mouth 3 (three) times daily as needed for muscle spasms.   Fluticasone-Umeclidin-Vilant 100-62.5-25 MCG/INH Aepb Inhale 1 puff into the lungs daily.   furosemide 40 MG tablet Commonly known as:  LASIX Take 1 tablet (40 mg total) by mouth 2 (two) times daily. What changed:  when to take this   insulin pump Soln Inject 1 each into the skin 3 times daily with meals, bedtime and 2 AM. What changed:  additional instructions   Invokana 100 MG Tabs tablet Generic drug:  canagliflozin Take 1 tablet by mouth daily.   Iron 325 (65 Fe) MG Tabs Take 1 tablet by mouth daily.   losartan 100 MG tablet Commonly known as:  COZAAR Take 100 mg by mouth daily.   metFORMIN 500 MG tablet Commonly known as:  GLUCOPHAGE Take 1,000 mg by mouth 2 (two) times daily with a meal.   montelukast 10 MG tablet Commonly known as:  SINGULAIR Take 10 mg by mouth at bedtime.   solifenacin 10 MG tablet Commonly known as:  VESICARE Take 1 tablet (10 mg total) by mouth daily.   vitamin B-12 500 MCG tablet Commonly known as:  CYANOCOBALAMIN Take 500 mcg by mouth daily.       If you experience worsening of your admission symptoms, develop shortness of breath, life threatening emergency, suicidal or homicidal thoughts you must seek medical attention immediately by calling 911 or calling  your MD immediately  if symptoms less severe.  You Must read complete instructions/literature along with all the possible adverse reactions/side effects for all the Medicines you take and that have been prescribed to you. Take any new Medicines after you have completely understood and accept all the possible adverse reactions/side effects.   Please note  You were cared for by a hospitalist during your hospital stay. If you have any questions about your discharge medications or the care you received while you were in the hospital after you are discharged, you can call the unit and asked to speak with the hospitalist on call if the hospitalist that took care of you is not available. Once you are discharged, your primary care physician will handle any further medical issues. Please note that NO REFILLS for any discharge medications will be authorized once you are discharged, as it is imperative that you return to your primary care physician (or establish a relationship with a primary care physician if you  do not have one) for your aftercare needs so that they can reassess your need for medications and monitor your lab values. Today   SUBJECTIVE   I feel at baseline and want to go home weak  VITAL SIGNS:  Blood pressure (!) 131/58, pulse 88, temperature 97.9 F (36.6 C), temperature source Oral, resp. rate 20, height 5\' 3"  (1.6 m), weight 97.6 kg, SpO2 92 %.  I/O:    Intake/Output Summary (Last 24 hours) at 08/10/2018 1113 Last data filed at 08/10/2018 1005 Gross per 24 hour  Intake 480 ml  Output 1200 ml  Net -720 ml    PHYSICAL EXAMINATION:  GENERAL:  73 y.o.-year-old patient lying in the bed with no acute distress. obese EYES: Pupils equal, round, reactive to light and accommodation. No scleral icterus. Extraocular muscles intact.  HEENT: Head atraumatic, normocephalic. Oropharynx and nasopharynx clear.  NECK:  Supple, no jugular venous distention. No thyroid enlargement, no tenderness.   LUNGS: decreased breath sounds bilaterally, no wheezing, rales,rhonchi or crepitation. No use of accessory muscles of respiration.  CARDIOVASCULAR: S1, S2 normal. No murmurs, rubs, or gallops.  ABDOMEN: Soft, non-tender, non-distended. Bowel sounds present. No organomegaly or mass.  EXTREMITIES: trace pedal edema, no cyanosis, or clubbing.  NEUROLOGIC: Cranial nerves II through XII are intact. Muscle strength 5/5 in all extremities. Sensation intact. Gait not checked.  PSYCHIATRIC: The patient is alert and oriented x 3.  SKIN: No obvious rash, lesion, or ulcer.   DATA REVIEW:   CBC  Recent Labs  Lab 08/08/18 0624  WBC 8.7  HGB 11.2*  HCT 35.7*  PLT 242    Chemistries  Recent Labs  Lab 08/05/18 1117  08/10/18 0514  NA 133*   < > 137  K 5.1   < > 3.8  CL 98   < > 94*  CO2 22   < > 34*  GLUCOSE 138*   < > 196*  BUN 54*   < > 28*  CREATININE 1.91*   < > 0.74  CALCIUM 8.6*   < > 8.7*  MG 2.4  --   --   AST 19  --   --   ALT 15  --   --   ALKPHOS 72  --   --   BILITOT 0.4  --   --    < > = values in this interval not displayed.    Microbiology Results   Recent Results (from the past 240 hour(s))  SARS Coronavirus 2 (CEPHEID - Performed in [facility] hospital lab), Hosp Order     Status: None   Collection Time: 08/05/18 11:42 AM  Result Value Ref Range Status   SARS Coronavirus 2 NEGATIVE NEGATIVE Final    Comment: (NOTE) If result is NEGATIVE SARS-CoV-2 target nucleic acids are NOT DETECTED. The SARS-CoV-2 RNA is generally detectable in upper and lower  respiratory specimens during the acute phase of infection. The lowest  concentration of SARS-CoV-2 viral copies this assay can detect is 250  copies / mL. A negative result does not preclude SARS-CoV-2 infection  and should not be used as the sole basis for treatment or other  patient management decisions.  A negative result may occur with  improper specimen collection / handling, submission of specimen other   than nasopharyngeal swab, presence of viral mutation(s) within the  areas targeted by this assay, and inadequate number of viral copies  (<250 copies / mL). A negative result must be combined with clinical  observations, patient  history, and epidemiological information. If result is POSITIVE SARS-CoV-2 target nucleic acids are DETECTED. The SARS-CoV-2 RNA is generally detectable in upper and lower  respiratory specimens dur ing the acute phase of infection.  Positive  results are indicative of active infection with SARS-CoV-2.  Clinical  correlation with patient history and other diagnostic information is  necessary to determine patient infection status.  Positive results do  not rule out bacterial infection or co-infection with other viruses. If result is PRESUMPTIVE POSTIVE SARS-CoV-2 nucleic acids MAY BE PRESENT.   A presumptive positive result was obtained on the submitted specimen  and confirmed on repeat testing.  While 2019 novel coronavirus  (SARS-CoV-2) nucleic acids may be present in the submitted sample  additional confirmatory testing may be necessary for epidemiological  and / or clinical management purposes  to differentiate between  SARS-CoV-2 and other Sarbecovirus currently known to infect humans.  If clinically indicated additional testing with an alternate test  methodology [phone]) is advised. The SARS-CoV-2 RNA is generally  detectable in upper and lower respiratory sp ecimens during the acute  phase of infection. The expected result is Negative. Fact Sheet for Patients:  StrictlyIdeas.no Fact Sheet for Healthcare Providers: BankingDealers.co.za This test is not yet approved or cleared by the [place] FDA and has been authorized for detection and/or diagnosis of SARS-CoV-2 by FDA under an Emergency Use Authorization (EUA).  This EUA will remain in effect (meaning this test can be used) for the duration of  the COVID-19 declaration under Section 564(b)(1) of the Act, 21 U.S.C. section 360bbb-3(b)(1), unless the authorization is terminated or revoked sooner. Performed at [facility], [address] [place], [place] [address]   MRSA PCR Screening     Status: None   Collection Time: 08/05/18  3:59 PM  Result Value Ref Range Status   MRSA by PCR NEGATIVE NEGATIVE Final    Comment:        The GeneXpert MRSA Assay (FDA approved for NASAL specimens only), is one component of a comprehensive MRSA colonization surveillance program. It is not intended to diagnose MRSA infection nor to guide or monitor treatment for MRSA infections. Performed at [facility], [address], [place], [place] [address]     RADIOLOGY:  No results found.   CODE STATUS:     Code Status Orders  (From admission, onward)         Start     Ordered   08/05/18 1547  Full code  Continuous     08/05/18 1547        Code Status History    Date Active Date Inactive Code Status Order ID Comments User Context   06/25/2018 1417 06/30/2018 1557 Full Code [id number]  Epifanio Lesches, MD ED   09/15/2017 1747 09/18/2017 2106 DNR [id number]  Hillary Bow, MD ED   08/26/2017 2216 08/30/2017 1604 Full Code [id number]  Nicholes Mango, MD Inpatient   01/08/2015 0307 01/09/2015 1336 Full Code [id number]  Theodoro Grist, MD Inpatient      TOTAL TIME TAKING CARE OF THIS PATIENT: *40* minutes.    Fritzi Mandes M.D on 08/10/2018 at 11:13 AM  Between 7am to 6pm - Pager - [phone] After 6pm go to [email or web address] - password [email or web address] [facility]  Office  [phone]  CC: Primary care physician; Ricardo Jericho, NP

## 2018-08-10 NOTE — Plan of Care (Signed)
  Problem: Health Behavior/Discharge Planning: Goal: Ability to manage health-related needs will improve Outcome: Progressing   Problem: Clinical Measurements: Goal: Ability to maintain clinical measurements within normal limits will improve Outcome: Progressing   Problem: Pain Managment: Goal: General experience of comfort will improve Outcome: Progressing   Problem: Safety: Goal: Ability to remain free from injury will improve Outcome: Progressing   

## 2018-08-10 NOTE — TOC Transition Note (Addendum)
Transition of Care [facility]) - CM/SW Discharge Note   Patient Details  Name: Karen [name] MRN: [id number] Date of Birth: 07-01-1945  [facility] CM/SW Contact:  Latanya Maudlin, RN Phone Number: 08/10/2018, 12:28 PM   Clinical Narrative:  Patient to be discharged per MD order. Orders in place for home health services. Patient is currently refusing snf. Patient prefers to return home with her spouse and son. CMS Medicare.gov Compare Post Acute Care list reviewed with patient and she has no preference of agency. [facility]. Patient will also use HF protocol, signed protocol formed faxed to [facility]. Patient has cane and chronic O2 through [employer] in the home. Family to transport  *late entry- 6/7 11:30. Helene Kelp with [facility] informed RNCM that the patients address was in a service area where there was not enough staff for [facility] to cover. Referral then placed with [facility] due to service area. VM left for patient        Barriers to Discharge: Barriers Resolved   Patient Goals and CMS Choice Patient states their goals for this hospitalization and ongoing recovery are:: Not to catch to catch the corolla virus CMS Medicare.gov Compare Post Acute Care list provided to:: Patient Choice offered to / list presented to : Patient  Discharge Placement                       Discharge Plan and Services   Discharge Planning Services: CM Consult Post Acute Care Choice: Home Health                    HH Arranged: RN, PT, Disease Management [place] Agency: [facility] Date [place]: 08/10/18 Time [place]: 1228 Representative spoke with at [place]: teresa  Social Determinants of Health ([place]) Interventions     Readmission Risk Interventions Readmission Risk Prevention Plan 08/10/2018 08/09/2018 06/26/2018  Transportation Screening Complete Complete Complete  PCP or Specialist Appt within 5-7  Days - - Complete  Home Care Screening - - Complete  Medication Review (RN CM) - - Complete  Palliative Care Screening - Not Applicable -  Medication Review (RN Care Manager) Complete Complete -  PCP or Specialist appointment within 3-5 days of discharge Complete - -  [place] or [place] Patient Refused - -  Some recent data might be hidden

## 2018-08-10 NOTE — Progress Notes (Signed)
Progress Note  Patient Name: Karen [name] Date of Encounter: 08/10/2018  Primary Cardiologist: Karen Rogue, MD   Subjective   Feeling much better today.  She has net -2.5 L.  Is not short of breath and feels that she is back to normal.  She is having trouble getting up and moving.  She was very weak when sitting up to auscultate her lungs.  Inpatient Medications    Scheduled Meds: . aspirin  81 mg Oral Daily  . atorvastatin  10 mg Oral Daily  . diltiazem  30 mg Oral Q8H  . docusate sodium  100 mg Oral BID  . ferrous sulfate  325 mg Oral Daily  . fluticasone furoate-vilanterol  1 puff Inhalation Daily   And  . umeclidinium bromide  1 puff Inhalation Daily  . furosemide  40 mg Intravenous BID  . heparin  5,000 Units Subcutaneous Q8H  . insulin aspart  0-5 Units Subcutaneous QHS  . insulin aspart  0-9 Units Subcutaneous TID WC  . insulin pump   Subcutaneous Q24H  . losartan  100 mg Oral Daily  . mouth rinse  15 mL Mouth Rinse BID  . sodium chloride flush  3 mL Intravenous Q12H  . vitamin B-12  500 mcg Oral Daily   Continuous Infusions:  PRN Meds: acetaminophen **OR** acetaminophen, metoprolol tartrate, ondansetron **OR** ondansetron (ZOFRAN) IV, polyethylene glycol, sodium chloride flush   Vital Signs    Vitals:   08/09/18 1855 08/09/18 1911 08/10/18 0433 08/10/18 0804  BP: 123/64 (!) 145/69 126/70 (!) 131/58  Pulse: (!) 123 95 98 88  Resp:    20  Temp:  98.6 F (37 C) 98.2 F (36.8 C) 97.9 F (36.6 C)  TempSrc:  Oral Oral Oral  SpO2:  93% 93% 92%  Weight:   97.6 kg   Height:        Intake/Output Summary (Last 24 hours) at 08/10/2018 1045 Last data filed at 08/10/2018 1005 Gross per 24 hour  Intake 480 ml  Output 1200 ml  Net -720 ml   Filed Weights   08/08/18 0219 08/09/18 0642 08/10/18 0433  Weight: 102.1 kg 99.4 kg 97.6 kg    Telemetry    Sinus rhythm one episode of likely atrial tachycardia- Personally Reviewed  ECG    None new- Personally  Reviewed  Physical Exam   GEN: Well nourished, well developed, in no acute distress  HEENT: normal  Neck: no JVD, carotid bruits, or masses Cardiac: RRR; 2 out of 6 systolic murmur, no rubs, or gallops,no edema  Respiratory:  clear to auscultation bilaterally, normal work of breathing GI: soft, nontender, nondistended, + BS MS: no deformity or atrophy  Skin: warm and dry Neuro:  Strength and sensation are intact Psych: euthymic mood, full affect   Labs    Chemistry Recent Labs  Lab 08/05/18 1117  08/08/18 0624 08/09/18 0536 08/10/18 0514  NA 133*   < > 138 140 137  K 5.1   < > 4.0 3.9 3.8  CL 98   < > 97* 96* 94*  CO2 22   < > 31 34* 34*  GLUCOSE 138*   < > 147* 171* 196*  BUN 54*   < > 39* 25* 28*  CREATININE 1.91*   < > 0.69 0.60 0.74  CALCIUM 8.6*   < > 9.1 8.9 8.7*  PROT 6.6  --   --   --   --   ALBUMIN 3.5  --   --   --   --  AST 19  --   --   --   --   ALT 15  --   --   --   --   ALKPHOS 72  --   --   --   --   BILITOT 0.4  --   --   --   --   GFRNONAA 25*   < > >60 >60 >60  GFRAA 29*   < > >60 >60 >60  ANIONGAP 13   < > 10 10 9    < > = values in this interval not displayed.     Hematology Recent Labs  Lab 08/06/18 0413 08/07/18 0524 08/08/18 0624  WBC 14.9* 10.5 8.7  RBC 4.18 4.02 4.04  HGB 11.5* 11.2* 11.2*  HCT 36.5 35.2* 35.7*  MCV 87.3 87.6 88.4  MCH 27.5 27.9 27.7  MCHC 31.5 31.8 31.4  RDW 15.9* 16.1* 15.8*  PLT 284 197 242    Cardiac Enzymes Recent Labs  Lab 08/05/18 1117  TROPONINI <0.03   No results for input(s): TROPIPOC in the last 168 hours.   BNP Recent Labs  Lab 08/05/18 1118 08/05/18 1624  BNP 826.0* 831.0*     DDimer No results for input(s): DDIMER in the last 168 hours.   Radiology    No results found.  Cardiac Studies   08/05/2018 Echo  1. The left ventricle has low normal systolic function, with an ejection fraction of 50-55%. The cavity size was normal. There is mild concentric left ventricular  hypertrophy. Left ventricular diastolic Doppler parameters are indeterminate.  2. The right ventricle has low normal systolic function. The cavity was mildly enlarged. There is no increase in right ventricular wall thickness. Right ventricular systolic pressure is moderately elevated with an estimated pressure of 60.4 mmHg.  3. Left atrial size was mildly dilated.  4. Right atrial size was mildly dilated.  5. The mitral valve is grossly normal.  6. The tricuspid valve is grossly normal. Tricuspid valve regurgitation is mild-moderate.  7. The aortic valve is grossly normal. Mild thickening of the aortic valve. Mild calcification of the aortic valve. Aortic valve regurgitation was not assessed by color flow Doppler. Mild stenosis of the aortic valve.  8. The inferior vena cava was dilated in size with <50% respiratory variability.  Patient Profile     73 y.o. 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  Assessment & Plan   Acute on chronic HFpEF / pulmonary HTN The patient feels that she is at her baseline as far as fluid status.  Her shortness of breath has improved.  I Karen [name] switch her over to p.o. diuresis today.  Creatinine much improved.  Junctional bradycardia, resolved Since resolved.  Continue to hold beta-blocker  Hypertension Could restart ARB as kidney function has improved  HLD Continue statin  AKI, resolved Creatinine at baseline  DM2 Per internal medicine  Morbid obesity  Needs lifestyle changes  Hypothyroid Plan per primary team  For questions or updates, please contact [place] Please consult [email or web address] for contact info under        Signed, Karen Luallen Meredith Leeds, MD  08/10/2018, 10:45 AM

## 2018-08-12 ENCOUNTER — Telehealth: Payer: Self-pay | Admitting: Cardiovascular Disease

## 2018-08-12 NOTE — Telephone Encounter (Signed)
Virtual Visit Pre-Appointment Phone Call  "(Name), I am calling you today to discuss your upcoming appointment. We are currently trying to limit exposure to the virus that causes COVID-19 by seeing patients at home rather than in the office."  1. "What is the BEST phone number to call the day of the visit?" - include this in appointment notes  2. Do you have or have access to (through a family member/friend) a smartphone with video capability that we can use for your visit?" a. If yes - list this number in appt notes as cell (if different from BEST phone #) and list the appointment type as a VIDEO visit in appointment notes b. If no - list the appointment type as a PHONE visit in appointment notes  3. Confirm consent - "In the setting of the current Covid19 crisis, you are scheduled for a (phone or video) visit with your provider on (date) at (time).  Just as we do with many in-office visits, in order for you to participate in this visit, we must obtain consent.  If you'd like, I can send this to your mychart (if signed up) or email for you to review.  Otherwise, I can obtain your verbal consent now.  All virtual visits are billed to your insurance company just like a normal visit would be.  By agreeing to a virtual visit, we'd like you to understand that the technology does not allow for your provider to perform an examination, and thus may limit your provider's ability to fully assess your condition. If your provider identifies any concerns that need to be evaluated in person, we will make arrangements to do so.  Finally, though the technology is pretty good, we cannot assure that it will always work on either your or our end, and in the setting of a video visit, we may have to convert it to a phone-only visit.  In either situation, we cannot ensure that we have a secure connection.  Are you willing to proceed?" STAFF: Did the patient verbally acknowledge consent to telehealth visit? Document  YES/NO here: yes  4. Advise patient to be prepared - "Two hours prior to your appointment, go ahead and check your blood pressure, pulse, oxygen saturation, and your weight (if you have the equipment to check those) and write them all down. When your visit starts, your provider will ask you for this information. If you have an Apple Watch or Kardia device, please plan to have heart rate information ready on the day of your appointment. Please have a pen and paper handy nearby the day of the visit as well."  5. Give patient instructions for MyChart download to smartphone OR Doximity/Doxy.me as below if video visit (depending on what platform provider is using)  6. Inform patient they will receive a phone call 15 minutes prior to their appointment time (may be from unknown caller ID) so they should be prepared to answer    TELEPHONE CALL NOTE  Karen [name] has been deemed a candidate for a follow-up tele-health visit to limit community exposure during the Covid-19 pandemic. I spoke with the patient via phone to ensure availability of phone/video source, confirm preferred email & phone number, and discuss instructions and expectations.  I reminded Karen [name] to be prepared with any vital sign and/or heart rhythm information that could potentially be obtained via home monitoring, at the time of her visit. I reminded Karen [name] to expect a phone call prior to  her visit.  Clarisse Gouge 08/12/2018 10:26 AM   INSTRUCTIONS FOR DOWNLOADING THE MYCHART APP TO SMARTPHONE  - The patient must first make sure to have activated MyChart and know their login information - If Apple, go to [employer] and type in MyChart in the search bar and download the app. If Android, ask patient to go to [employer] and type in [place] in the search bar and download the app. The app is free but as with any other app downloads, their phone may require them to verify saved payment information or  Apple/Android password.  - The patient will need to then log into the app with their MyChart username and password, and select [facility] as their healthcare provider to link the account. When it is time for your visit, go to the MyChart app, find appointments, and click Begin Video Visit. Be sure to Select Allow for your device to access the Microphone and Camera for your visit. You will then be connected, and your provider will be with you shortly.  **If they have any issues connecting, or need assistance please contact MyChart service desk [phone])**  **If using a computer, in order to ensure the best quality for their visit they will need to use either of the following Internet Browsers: [employer], or Google Chrome**  IF USING DOXIMITY or DOXY.ME - The patient will receive a link just prior to their visit by text.     FULL LENGTH CONSENT FOR TELE-HEALTH VISIT   I hereby voluntarily request, consent and authorize [facility] and its employed or contracted physicians, physician assistants, nurse practitioners or other licensed health care professionals (the Practitioner), to provide me with telemedicine health care services (the Services") as deemed necessary by the treating Practitioner. I acknowledge and consent to receive the Services by the Practitioner via telemedicine. I understand that the telemedicine visit will involve communicating with the Practitioner through live audiovisual communication technology and the disclosure of certain medical information by electronic transmission. I acknowledge that I have been given the opportunity to request an in-person assessment or other available alternative prior to the telemedicine visit and am voluntarily participating in the telemedicine visit.  I understand that I have the right to withhold or withdraw my consent to the use of telemedicine in the course of my care at any time, without affecting my right to future care  or treatment, and that the Practitioner or I may terminate the telemedicine visit at any time. I understand that I have the right to inspect all information obtained and/or recorded in the course of the telemedicine visit and may receive copies of available information for a reasonable fee.  I understand that some of the potential risks of receiving the Services via telemedicine include:   Delay or interruption in medical evaluation due to technological equipment failure or disruption;  Information transmitted may not be sufficient (e.g. poor resolution of images) to allow for appropriate medical decision making by the Practitioner; and/or   In rare instances, security protocols could fail, causing a breach of personal health information.  Furthermore, I acknowledge that it is my responsibility to provide information about my medical history, conditions and care that is complete and accurate to the best of my ability. I acknowledge that Practitioner's advice, recommendations, and/or decision may be based on factors not within their control, such as incomplete or inaccurate data provided by me or distortions of diagnostic images or specimens that may result from electronic transmissions. I  understand that the practice of medicine is not an exact science and that Practitioner makes no warranties or guarantees regarding treatment outcomes. I acknowledge that I will receive a copy of this consent concurrently upon execution via email to the email address I last provided but may also request a printed copy by calling the office of [facility].    I understand that my insurance will be billed for this visit.   I have read or had this consent read to me.  I understand the contents of this consent, which adequately explains the benefits and risks of the Services being provided via telemedicine.   I have been provided ample opportunity to ask questions regarding this consent and the Services and have had  my questions answered to my satisfaction.  I give my informed consent for the services to be provided through the use of telemedicine in my medical care  By participating in this telemedicine visit I agree to the above.

## 2018-08-13 ENCOUNTER — Telehealth (HOSPITAL_COMMUNITY): Payer: Self-pay

## 2018-08-13 NOTE — Telephone Encounter (Signed)
Karen [name] contacted me back, she states doing ok.  Set up home visit for Thursday morning.  She states has not heard nothing from home Health, contacted Randall Hiss with [facility] and he states he will check into it.    [place] [phone]

## 2018-08-13 NOTE — Telephone Encounter (Signed)
Attempted to contact Karen [name] to set up home visit, no answer but left message.    [place] [phone]

## 2018-08-19 ENCOUNTER — Other Ambulatory Visit: Payer: Self-pay

## 2018-08-19 ENCOUNTER — Ambulatory Visit: Payer: Medicare Other | Attending: Family | Admitting: Family

## 2018-08-19 ENCOUNTER — Encounter: Payer: Self-pay | Admitting: Family

## 2018-08-19 VITALS — Wt 211.0 lb

## 2018-08-19 DIAGNOSIS — I5032 Chronic diastolic (congestive) heart failure: Secondary | ICD-10-CM

## 2018-08-19 DIAGNOSIS — E119 Type 2 diabetes mellitus without complications: Secondary | ICD-10-CM

## 2018-08-19 DIAGNOSIS — I10 Essential (primary) hypertension: Secondary | ICD-10-CM

## 2018-08-19 NOTE — Progress Notes (Signed)
Virtual Visit via Telephone Note    Evaluation Performed:  Follow-up visit  This visit type was conducted due to national recommendations for restrictions regarding the COVID-19 Pandemic (e.g. social distancing).  This format is felt to be most appropriate for this patient at this time.  All issues noted in this document were discussed and addressed.  No physical exam was performed (except for noted visual exam findings with Video Visits).  Please refer to the patient's chart (MyChart message for video visits and phone note for telephone visits) for the patient's consent to telehealth for [facility]  Date:  08/19/2018   ID:  Karen [name], DOB [birth year], MRN [id number]  Patient Location:  [address] [place] [address]   Provider location:   [facility] [facility] [address], [place] [address]  PCP:  Ricardo Jericho, NP  Cardiologist:  Ida Rogue, MD  Electrophysiologist:  None   Chief Complaint:  fatigue  History of Present Illness:    Karen [name] is a 73 y.o. female who presents via audio/video conferencing for a telehealth visit today.  Patient verified DOB and address.  The patient does not have symptoms concerning for COVID-19 infection (fever, chills, cough, or new SHORTNESS OF BREATH).   Patient reports minimal fatigue upon moderate exertion. She says that this has been present for several years but that she's feeling better since her most recent admission. She doesn't have any other complaints and specifically denies any dizziness, swelling in her legs/ abdomen, palpitations, chest pain, shortness of breath, cough or weight gain. Says that she's not adding any salt to her food.   Was recently in the hospital on 08/05/2018 due to junctional bradycardia along with acute on chronic HF requiring dopamine drip initially. Lost ~ 2.6L.   Prior CV studies:   The following studies were reviewed today:  Echo report from  08/05/2018 reviewed and showed an EF of 50-55% along with mild/ moderate TR and a PA pressure of 60.4 mmHg.   Past Medical History:  Diagnosis Date  . (HFpEF) heart failure with preserved ejection fraction ([facility]) 2017   (1) TTE 2017 a. EF 50% b. mild LVH c. mild MR/TR, d. mild LAE e. mild pulmonary HTN  (2) TTE 06/2018 a. EF 50-55%, mild AS with thickening of valve   . Acute on chronic respiratory failure with hypoxia and hypercapnia ([facility]) 01/07/2015  . Anemia   . 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 Asthma   . Cataract   . Chronic kidney disease 08/10/2017  . CKD (chronic kidney disease)   . COPD (chronic obstructive pulmonary disease) ([facility])    (1) 06/2018 tobacco use, home 3L oxygen   . Diabetes mellitus without complication (HCC)    (1) A1C 7.7 (06/2018)  . Edema, peripheral 04/20/2014  . History of kidney stones   . Hyperlipidemia   . Hypertension   . Iron deficiency anemia 06/22/2014  . Leucocytosis 10/19/2015  . Overactive bladder   . Primary osteoarthritis of right knee 09/01/2016  . Renal insufficiency   . Sciatica 01/07/2015   Past Surgical History:  Procedure Laterality Date  . APPENDECTOMY    . CESAREAN SECTION     x3  . CHOLECYSTECTOMY    . COLONOSCOPY WITH PROPOFOL N/A 08/28/2017   Procedure: COLONOSCOPY WITH PROPOFOL;  Surgeon: Lucilla Lame, MD;  Location: [facility] ENDOSCOPY;  Service: Endoscopy;  Laterality: N/A;  . COLONOSCOPY WITH PROPOFOL N/A 08/29/2017   Procedure: COLONOSCOPY WITH PROPOFOL;  Surgeon: Lucilla Lame, MD;  Location: [facility] ENDOSCOPY;  Service: Endoscopy;  Laterality: N/A;  . CYSTOSCOPY W/ URETERAL STENT PLACEMENT Right 09/15/2017   Procedure: CYSTOSCOPY WITH RETROGRADE PYELOGRAM/URETERAL STENT PLACEMENT;  Surgeon: Cleon Gustin, MD;  Location: [facility] ORS;  Service: Urology;  Laterality: Right;  . CYSTOSCOPY/URETEROSCOPY/HOLMIUM LASER/STENT PLACEMENT Right 10/09/2017   Procedure: CYSTOSCOPY/URETEROSCOPY/HOLMIUM LASER/STENT PLACEMENT;  Surgeon: Abbie Sons, MD;  Location:  [facility] ORS;  Service: Urology;  Laterality: Right;  right Stent exchange  . EYE SURGERY       Current Meds  Medication Sig  . aspirin 81 MG chewable tablet Chew 81 mg by mouth daily. On hold  . atorvastatin (LIPITOR) 10 MG tablet Take 1 tablet by mouth daily.  . canagliflozin (INVOKANA) 100 MG TABS tablet Take 1 tablet by mouth daily.  . cyclobenzaprine (FLEXERIL) 10 MG tablet Take 1 tablet (10 mg total) by mouth 3 (three) times daily as needed for muscle spasms. (Patient taking differently: Take 10 mg by mouth daily as needed for muscle spasms. )  . Ferrous Sulfate (IRON) 325 (65 Fe) MG TABS Take 1 tablet by mouth daily.  . fluticasone furoate-vilanterol (BREO ELLIPTA) 100-25 MCG/INH AEPB Inhale 1 puff into the lungs daily.  . Fluticasone-Umeclidin-Vilant 100-62.5-25 MCG/INH AEPB Inhale 1 puff into the lungs daily.  . furosemide (LASIX) 40 MG tablet Take 1 tablet (40 mg total) by mouth 2 (two) times daily.  . Insulin Human (INSULIN PUMP) SOLN Inject 1 each into the skin 3 times daily with meals, bedtime and 2 AM. (Patient taking differently: Inject 1 each into the skin 3 times daily with meals, bedtime and 2 AM. Pt uses Novolog)  . losartan (COZAAR) 100 MG tablet Take 100 mg by mouth daily.  . metFORMIN (GLUCOPHAGE) 500 MG tablet Take 1,000 mg by mouth 2 (two) times daily with a meal.   . montelukast (SINGULAIR) 10 MG tablet Take 10 mg by mouth at bedtime.  . solifenacin (VESICARE) 10 MG tablet Take 1 tablet (10 mg total) by mouth daily.  . vitamin B-12 (CYANOCOBALAMIN) 500 MCG tablet Take 500 mcg by mouth daily.     Allergies:   Ace inhibitors, Gabapentin, Lisinopril, Lyrica [pregabalin], and Shrimp [shellfish allergy]   Social History   Tobacco Use  . Smoking status: Former Smoker    Quit date: 12/04/1992    Years since quitting: 25.7  . Smokeless tobacco: Never Used  Substance Use Topics  . Alcohol use: No  . Drug use: No     Family Hx: The patient's family history includes  Other in her father and mother.  ROS:   Please see the history of present illness.     All other systems reviewed and are negative.   Labs/Other Tests and Data Reviewed:    Recent Labs: 08/05/2018: ALT 15; B Natriuretic Peptide 831.0; Magnesium 2.4; TSH 3.242 08/08/2018: Hemoglobin 11.2; Platelets 242 08/10/2018: BUN 28; Creatinine, Ser 0.74; Potassium 3.8; Sodium 137   Recent Lipid Panel No results found for: CHOL, TRIG, HDL, CHOLHDL, LDLCALC, LDLDIRECT  Wt Readings from Last 3 Encounters:  08/19/18 211 lb (95.7 kg)  08/10/18 215 lb 3.2 oz (97.6 kg)  08/02/18 222 lb (100.7 kg)     Exam:    Vital Signs:  Wt 211 lb (95.7 kg) Comment: self-reported  BMI 37.38 kg/m    Well nourished, well developed female in no  acute distress.   ASSESSMENT & PLAN:    1. Chronic heart failure with preserved ejection fraction- - NYHA class  II - euvolemic today based on patient's description of symptoms - weighing daily and she was reminded to call for an overnight weight gain of >2 pounds or a weekly weight gain of >5 pounds - not adding salt and says that she's using No-Salt for seasoning - wearing oxygen at 3L around the clock  - has telemedicine visit scheduled with cardiology Rockey Situ) 08/22/2018 - BNP 08/05/2018 was 831.0 - currently participating in paramedicine program - has home health coming in weekly but she can't recall which agency  2: HTN- - not checking her BP at home - follows with Eulogio Bear, NP for her primary care needs and saw her 07/23/2018 - BMP from 08/10/2018 reviewed and showed sodium 137, potassium 3.8, creatinine 0.74 and GFR >60  3: Diabetes-   - says her glucose has been "good" although hasn't checked it yet today - has insulin pump - saw endocrinologist Pasty Arch) 04/04/2018   COVID-19 Education: The signs and symptoms of COVID-19 were discussed with the patient and how to seek care for testing (follow up with PCP or arrange E-visit).  The importance of  social distancing was discussed today.  Patient Risk:   After full review of this patients clinical status, I feel that they are at least moderate risk at this time.  Time:   Today, I have spent 9 minutes with the patient with telehealth technology discussing medications, diet and symptoms to call about.     Medication Adjustments/Labs and Tests Ordered: Current medicines are reviewed at length with the patient today.  Concerns regarding medicines are outlined above.   Tests Ordered: No orders of the defined types were placed in this encounter.  Medication Changes: No orders of the defined types were placed in this encounter.   Disposition:  Follow-up in 6 weeks or sooner for any questions/problems before then.   Signed, Alisa Graff, FNP  08/19/2018 11:01 AM    [facility]

## 2018-08-19 NOTE — Patient Instructions (Signed)
Continue weighing daily and call for an overnight weight gain of > 2 pounds or a weekly weight gain of >5 pounds. 

## 2018-08-21 ENCOUNTER — Other Ambulatory Visit (HOSPITAL_COMMUNITY): Payer: Self-pay

## 2018-08-21 NOTE — Progress Notes (Signed)
Today was a visit at home of [place].  She states been doing well, loves the Moms meals, she states they help.  She has some swelling in ankle area and plus 1 edema in legs.  Lungs are clear.  Denies shortness of breath, headaches, dizziness or chest pain.  She wears oxygen 24/hrs a day.  She states watches her fluid intake. She walks with walker.  She has been going to her appts and aware of her upcoming appts.  Verified her meds and she is taking trelegy.  She knows her medications and aware of how to take them.  Her blood sugars has been in mid 100's.  Advised her of weight gain of 3 lbs overnight or 5 in a week to contact someone.  Call if short of breath and if have swelling to let us know.  She appears to understand.  She has home health coming out with nursing and PT.  Will continue to visit for heart failure.   [place] [phone]

## 2018-08-22 ENCOUNTER — Telehealth (INDEPENDENT_AMBULATORY_CARE_PROVIDER_SITE_OTHER): Payer: Medicare Other | Admitting: Cardiovascular Disease

## 2018-08-22 ENCOUNTER — Other Ambulatory Visit: Payer: Self-pay

## 2018-08-22 DIAGNOSIS — E119 Type 2 diabetes mellitus without complications: Secondary | ICD-10-CM

## 2018-08-22 DIAGNOSIS — I5032 Chronic diastolic (congestive) heart failure: Secondary | ICD-10-CM

## 2018-08-22 DIAGNOSIS — I10 Essential (primary) hypertension: Secondary | ICD-10-CM

## 2018-08-22 DIAGNOSIS — R197 Diarrhea, unspecified: Secondary | ICD-10-CM | POA: Diagnosis not present

## 2018-08-22 DIAGNOSIS — Z794 Long term (current) use of insulin: Secondary | ICD-10-CM

## 2018-08-22 DIAGNOSIS — R001 Bradycardia, unspecified: Secondary | ICD-10-CM

## 2018-08-22 NOTE — Progress Notes (Signed)
Virtual Visit via Telephone Note   This visit type was conducted due to national recommendations for restrictions regarding the COVID-19 Pandemic (e.g. social distancing) in an effort to limit this patient's exposure and mitigate transmission in our community.  Due to her co-morbid illnesses, this patient is at least at moderate risk for complications without adequate follow up.  This format is felt to be most appropriate for this patient at this time.  The patient did not have access to video technology/had technical difficulties with video requiring transitioning to audio format only (telephone).  All issues noted in this document were discussed and addressed.  No physical exam could be performed with this format.  Please refer to the patient's chart for her  consent to telehealth for [facility].   I connected with  [name] on 08/22/18 by a video enabled telemedicine application and verified that I am speaking with the correct person using two identifiers. I discussed the limitations of evaluation and management by telemedicine. The patient expressed understanding and agreed to proceed.   Evaluation Performed:  Follow-up visit  Date:  08/22/2018   ID:  AZHAR YOGI, DOB [birth year], MRN [id number]  Patient Location:  [address] [place] [address]   Provider location:   [facility] [place] office  PCP:  Ricardo Jericho, NP  Cardiologist:  Patsy Baltimore   Chief Complaint:  chf follow up    History of Present Illness:    JORDEN MAHL is a 73 y.o. female who presents via audio/video conferencing for a telehealth visit today.   The patient does not symptoms concerning for COVID-19 infection (fever, chills, cough, or new SHORTNESS OF BREATH).   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  Junctional bradycardia on arrival to the hospital with hypotension Markedly elevated right heart pressures on echocardiogram,severe pulmonary HTN,  BNP was 800  Treated with Lasix IV twice daily Poor renal function on arrival, suspected cardiorenal syndrome Improvement of renal function with diuresis Improvement of her abdominal bloating, cough improved, leg swelling improved  Significant leg weakness in the hospital  She has been seen in CHF clinic August 19, 2018 Reported to be euvolemic  Weight is 210 Feels good, no leg swelling, no ABD swelling   Prior CV studies:   The following studies were reviewed today:    Past Medical History:  Diagnosis Date  . (HFpEF) heart failure with preserved ejection fraction ([place]) 2017   (1) TTE 2017 a. EF 50% b. mild LVH c. mild MR/TR, d. mild LAE e. mild pulmonary HTN  (2) TTE 06/2018 a. EF 50-55%, mild AS with thickening of valve   . Acute on chronic respiratory failure with hypoxia and hypercapnia ([place]) 01/07/2015  . Anemia   . 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 Asthma   . Cataract   . Chronic kidney disease 08/10/2017  . CKD (chronic kidney disease)   . COPD (chronic obstructive pulmonary disease) ([place])    (1) 06/2018 tobacco use, home 3L oxygen   . Diabetes mellitus without complication (HCC)    (1) A1C 7.7 (06/2018)  . Edema, peripheral 04/20/2014  . History of kidney stones   . Hyperlipidemia   . Hypertension   .  Iron deficiency anemia 06/22/2014  . Leucocytosis 10/19/2015  . Overactive bladder   . Primary osteoarthritis of right knee 09/01/2016  . Renal insufficiency   . Sciatica 01/07/2015   Past Surgical History:  Procedure Laterality Date  . APPENDECTOMY    . CESAREAN SECTION     x3  . CHOLECYSTECTOMY    . COLONOSCOPY WITH PROPOFOL N/A 08/28/2017   Procedure: COLONOSCOPY WITH PROPOFOL;  Surgeon:  Lucilla Lame, MD;  Location: [facility] ENDOSCOPY;  Service: Endoscopy;  Laterality: N/A;  . COLONOSCOPY WITH PROPOFOL N/A 08/29/2017   Procedure: COLONOSCOPY WITH PROPOFOL;  Surgeon: Lucilla Lame, MD;  Location: [facility] ENDOSCOPY;  Service: Endoscopy;  Laterality: N/A;  . CYSTOSCOPY W/ URETERAL STENT PLACEMENT Right 09/15/2017   Procedure: CYSTOSCOPY WITH RETROGRADE PYELOGRAM/URETERAL STENT PLACEMENT;  Surgeon: Cleon Gustin, MD;  Location: [facility] ORS;  Service: Urology;  Laterality: Right;  . CYSTOSCOPY/URETEROSCOPY/HOLMIUM LASER/STENT PLACEMENT Right 10/09/2017   Procedure: CYSTOSCOPY/URETEROSCOPY/HOLMIUM LASER/STENT PLACEMENT;  Surgeon: Abbie Sons, MD;  Location: [facility] ORS;  Service: Urology;  Laterality: Right;  right Stent exchange  . EYE SURGERY       No outpatient medications have been marked as taking for the 08/22/18 encounter (Telemedicine) with [name], MD.     Allergies:   Ace inhibitors, Gabapentin, Lisinopril, Lyrica [pregabalin], and Shrimp [shellfish allergy]   Social History   Tobacco Use  . Smoking status: Former Smoker    Quit date: 12/04/1992    Years since quitting: 25.7  . Smokeless tobacco: Never Used  Substance Use Topics  . Alcohol use: No  . Drug use: No     Current Outpatient Medications on File Prior to Visit  Medication Sig Dispense Refill  . aspirin 81 MG chewable tablet Chew 81 mg by mouth daily. On hold    . atorvastatin (LIPITOR) 10 MG tablet Take 1 tablet by mouth daily.    . canagliflozin (INVOKANA) 100 MG TABS tablet Take 1 tablet by mouth daily.    . cyclobenzaprine (FLEXERIL) 10 MG tablet Take 1 tablet (10 mg total) by mouth 3 (three) times daily as needed for muscle spasms. (Patient taking differently: Take 10 mg by mouth daily as needed for muscle spasms. ) 30 tablet 0  . Ferrous Sulfate (IRON) 325 (65 Fe) MG TABS Take 1 tablet by mouth daily.  11  . fluticasone furoate-vilanterol (BREO ELLIPTA) 100-25 MCG/INH AEPB Inhale 1 puff into the  lungs daily.    . Fluticasone-Umeclidin-Vilant 100-62.5-25 MCG/INH AEPB Inhale 1 puff into the lungs daily.    . furosemide (LASIX) 40 MG tablet Take 1 tablet (40 mg total) by mouth 2 (two) times daily. 30 tablet 0  . Insulin Human (INSULIN PUMP) SOLN Inject 1 each into the skin 3 times daily with meals, bedtime and 2 AM. (Patient taking differently: Inject 1 each into the skin 3 times daily with meals, bedtime and 2 AM. Pt uses Novolog)    . losartan (COZAAR) 100 MG tablet Take 100 mg by mouth daily.  11  . metFORMIN (GLUCOPHAGE) 500 MG tablet Take 1,000 mg by mouth 2 (two) times daily with a meal.     . montelukast (SINGULAIR) 10 MG tablet Take 10 mg by mouth at bedtime.    . solifenacin (VESICARE) 10 MG tablet Take 1 tablet (10 mg total) by mouth daily. 30 tablet 5  . vitamin B-12 (CYANOCOBALAMIN) 500 MCG tablet Take 500 mcg by mouth daily.     No current facility-administered medications on file prior to visit.  Family Hx: The patient's family history includes Other in her father and mother.  ROS:   Please see the history of present illness.    Review of Systems  Constitutional: Negative.   HENT: Negative.   Respiratory: Positive for shortness of breath.   Cardiovascular: Negative.   Gastrointestinal: Negative.   Musculoskeletal: Negative.   Neurological: Negative.   Psychiatric/Behavioral: Negative.   All other systems reviewed and are negative.     Labs/Other Tests and Data Reviewed:    Recent Labs: 08/05/2018: ALT 15; B Natriuretic Peptide 831.0; Magnesium 2.4; TSH 3.242 08/08/2018: Hemoglobin 11.2; Platelets 242 08/10/2018: BUN 28; Creatinine, Ser 0.74; Potassium 3.8; Sodium 137   Recent Lipid Panel No results found for: CHOL, TRIG, HDL, CHOLHDL, LDLCALC, LDLDIRECT  Wt Readings from Last 3 Encounters:  08/21/18 210 lb (95.3 kg)  08/19/18 211 lb (95.7 kg)  08/10/18 215 lb 3.2 oz (97.6 kg)     Exam:    Vital Signs: Vital signs may also be detailed in the HPI  There were no vitals taken for this visit.  Wt Readings from Last 3 Encounters:  08/21/18 210 lb (95.3 kg)  08/19/18 211 lb (95.7 kg)  08/10/18 215 lb 3.2 oz (97.6 kg)   Temp Readings from Last 3 Encounters:  08/10/18 97.9 F (36.6 C) (Oral)  08/02/18 97.8 F (36.6 C) (Oral)  06/30/18 97.6 F (36.4 C) (Oral)   BP Readings from Last 3 Encounters:  08/21/18 (!) 146/80  08/10/18 (!) 131/58  08/02/18 103/63   Pulse Readings from Last 3 Encounters:  08/21/18 78  08/10/18 88  08/02/18 (!) 52    130/60 70 resp 16  Well nourished, well developed female in no acute distress. Constitutional:  oriented to person, place, and time. No distress.    ASSESSMENT & PLAN:    Chronic diastolic heart failure ([place]) - By her report, euvolemic Denies having abdominal bloating, leg swelling Continues to take Lasix 40 twice daily, weight stable at 210 pounds No PND, orthopnea No significant medication changes made Again discussed diet and fluid restrictions  Essential hypertension - Blood pressure is well controlled on today's visit. No changes made to the medications.  Type 2 diabetes mellitus without complication, with long-term current use of insulin (HCC) -discussed diet, lifestyle modification, weight loss, walking program  Junctional bradycardia - On arrival in the hospital had bradycardia After medication changes, maintaining normal sinus rhythm with adequate rate   COVID-19 Education: The signs and symptoms of COVID-19 were discussed with the patient and how to seek care for testing (follow up with PCP or arrange E-visit).  The importance of social distancing was discussed today.  Patient Risk:   After full review of this patients clinical status, I feel that they are at least moderate risk at this time.  Time:   Today, I have spent 25 minutes with the patient with telehealth technology discussing the cardiac and medical problems/diagnoses detailed above   10 min spent  reviewing the chart prior to patient visit today   Medication Adjustments/Labs and Tests Ordered: Current medicines are reviewed at length with the patient today.  Concerns regarding medicines are outlined above.   Tests Ordered: No tests ordered   Medication Changes: No changes made   Disposition: Follow-up in 6 months   Signed, Ida Rogue, MD  08/22/2018 3:59 PM    [place] Office [address] [place] [address], [place], [place] [address]

## 2018-08-22 NOTE — Patient Instructions (Signed)

## 2018-08-29 ENCOUNTER — Other Ambulatory Visit: Payer: Self-pay | Admitting: Cardiovascular Disease

## 2018-08-29 ENCOUNTER — Telehealth (HOSPITAL_COMMUNITY): Payer: Self-pay

## 2018-08-29 MED ORDER — FUROSEMIDE 40 MG PO TABS: 40.0000 mg | ORAL_TABLET | Freq: Two times a day (BID) | ORAL | 5 refills | Status: DC

## 2018-08-29 NOTE — Telephone Encounter (Signed)
Refilled

## 2018-08-29 NOTE — Telephone Encounter (Signed)
I was contacted by Karen [name] advising me she was running out of furosemide today, she states pharmacy has been sending in to get refills but they have not got a response.  Contacted Dr Gwenyth Ober office and advised them she needs refill sent to [facility] in [place], she stated she will send it in.  Alvina Chou of same.  I think the problem was that her last refill was from hospitalist from recent hospital stay.   [place] [phone]

## 2018-08-29 NOTE — Telephone Encounter (Signed)
Please advise if ok to refill Furosemide 40 mg tablet bid last filled by Posey Pronto.

## 2018-08-29 NOTE — Telephone Encounter (Signed)
°*  STAT* If patient is at the pharmacy, call can be transferred to refill team.   1. Which medications need to be refilled? (please list name of each medication and dose if known) furpsemide 40 mg bid  2. Which pharmacy/location (including street and city if local pharmacy) is medication to be sent to? [place] [facility]  3. Do they need a 30 day or 90 day supply? [place]

## 2018-09-14 ENCOUNTER — Other Ambulatory Visit: Payer: Self-pay

## 2018-09-14 ENCOUNTER — Inpatient Hospital Stay
Admission: EM | Admit: 2018-09-14 | Discharge: 2018-09-17 | DRG: 378 | Disposition: A | Discharge status: Home / Self Care | Payer: Medicare Other | Attending: Specialist | Admitting: Specialist

## 2018-09-14 ENCOUNTER — Emergency Department: Payer: Medicare Other

## 2018-09-14 DIAGNOSIS — Z20828 Contact with and (suspected) exposure to other viral communicable diseases: Secondary | ICD-10-CM | POA: Diagnosis present

## 2018-09-14 DIAGNOSIS — R32 Unspecified urinary incontinence: Secondary | ICD-10-CM | POA: Diagnosis present

## 2018-09-14 DIAGNOSIS — I130 Hypertensive heart and chronic kidney disease with heart failure and stage 1 through stage 4 chronic kidney disease, or unspecified chronic kidney disease: Secondary | ICD-10-CM | POA: Diagnosis present

## 2018-09-14 DIAGNOSIS — K5520 Angiodysplasia of colon without hemorrhage: Secondary | ICD-10-CM

## 2018-09-14 DIAGNOSIS — Z7982 Long term (current) use of aspirin: Secondary | ICD-10-CM | POA: Diagnosis not present

## 2018-09-14 DIAGNOSIS — N3281 Overactive bladder: Secondary | ICD-10-CM | POA: Diagnosis present

## 2018-09-14 DIAGNOSIS — K921 Melena: Secondary | ICD-10-CM | POA: Diagnosis not present

## 2018-09-14 DIAGNOSIS — Z9049 Acquired absence of other specified parts of digestive tract: Secondary | ICD-10-CM

## 2018-09-14 DIAGNOSIS — E785 Hyperlipidemia, unspecified: Secondary | ICD-10-CM | POA: Diagnosis present

## 2018-09-14 DIAGNOSIS — E1142 Type 2 diabetes mellitus with diabetic polyneuropathy: Secondary | ICD-10-CM | POA: Diagnosis present

## 2018-09-14 DIAGNOSIS — K250 Acute gastric ulcer with hemorrhage: Principal | ICD-10-CM | POA: Diagnosis present

## 2018-09-14 DIAGNOSIS — Z9641 Presence of insulin pump (external) (internal): Secondary | ICD-10-CM | POA: Diagnosis present

## 2018-09-14 DIAGNOSIS — E611 Iron deficiency: Secondary | ICD-10-CM | POA: Diagnosis present

## 2018-09-14 DIAGNOSIS — K259 Gastric ulcer, unspecified as acute or chronic, without hemorrhage or perforation: Secondary | ICD-10-CM | POA: Diagnosis not present

## 2018-09-14 DIAGNOSIS — I5032 Chronic diastolic (congestive) heart failure: Secondary | ICD-10-CM | POA: Diagnosis present

## 2018-09-14 DIAGNOSIS — I272 Pulmonary hypertension, unspecified: Secondary | ICD-10-CM | POA: Diagnosis present

## 2018-09-14 DIAGNOSIS — K31811 Angiodysplasia of stomach and duodenum with bleeding: Secondary | ICD-10-CM | POA: Diagnosis present

## 2018-09-14 DIAGNOSIS — N183 Chronic kidney disease, stage 3 (moderate): Secondary | ICD-10-CM | POA: Diagnosis present

## 2018-09-14 DIAGNOSIS — K31819 Angiodysplasia of stomach and duodenum without bleeding: Secondary | ICD-10-CM | POA: Diagnosis not present

## 2018-09-14 DIAGNOSIS — K5731 Diverticulosis of large intestine without perforation or abscess with bleeding: Secondary | ICD-10-CM | POA: Diagnosis present

## 2018-09-14 DIAGNOSIS — K254 Chronic or unspecified gastric ulcer with hemorrhage: Secondary | ICD-10-CM | POA: Diagnosis not present

## 2018-09-14 DIAGNOSIS — J449 Chronic obstructive pulmonary disease, unspecified: Secondary | ICD-10-CM | POA: Diagnosis present

## 2018-09-14 DIAGNOSIS — J9611 Chronic respiratory failure with hypoxia: Secondary | ICD-10-CM | POA: Diagnosis present

## 2018-09-14 DIAGNOSIS — Z7951 Long term (current) use of inhaled steroids: Secondary | ICD-10-CM | POA: Diagnosis not present

## 2018-09-14 DIAGNOSIS — D62 Acute posthemorrhagic anemia: Secondary | ICD-10-CM | POA: Diagnosis present

## 2018-09-14 DIAGNOSIS — Z794 Long term (current) use of insulin: Secondary | ICD-10-CM

## 2018-09-14 DIAGNOSIS — Z91013 Allergy to seafood: Secondary | ICD-10-CM | POA: Diagnosis not present

## 2018-09-14 DIAGNOSIS — Z87891 Personal history of nicotine dependence: Secondary | ICD-10-CM

## 2018-09-14 DIAGNOSIS — Z79899 Other long term (current) drug therapy: Secondary | ICD-10-CM

## 2018-09-14 DIAGNOSIS — Z9981 Dependence on supplemental oxygen: Secondary | ICD-10-CM

## 2018-09-14 DIAGNOSIS — Z888 Allergy status to other drugs, medicaments and biological substances status: Secondary | ICD-10-CM | POA: Diagnosis not present

## 2018-09-14 DIAGNOSIS — E1122 Type 2 diabetes mellitus with diabetic chronic kidney disease: Secondary | ICD-10-CM | POA: Diagnosis present

## 2018-09-14 DIAGNOSIS — Z66 Do not resuscitate: Secondary | ICD-10-CM | POA: Diagnosis present

## 2018-09-14 DIAGNOSIS — K922 Gastrointestinal hemorrhage, unspecified: Secondary | ICD-10-CM

## 2018-09-14 DIAGNOSIS — K920 Hematemesis: Secondary | ICD-10-CM | POA: Diagnosis not present

## 2018-09-14 LAB — COMPREHENSIVE METABOLIC PANEL
ALT: 13 U/L (ref 0–44)
AST: 20 U/L (ref 15–41)
Albumin: 3.5 g/dL (ref 3.5–5.0)
Alkaline Phosphatase: 52 U/L (ref 38–126)
Anion gap: 15 (ref 5–15)
BUN: 66 mg/dL — ABNORMAL HIGH (ref 8–23)
CO2: 30 mmol/L (ref 22–32)
Calcium: 9.3 mg/dL (ref 8.9–10.3)
Chloride: 94 mmol/L — ABNORMAL LOW (ref 98–111)
Creatinine, Ser: 1.49 mg/dL — ABNORMAL HIGH (ref 0.44–1.00)
GFR calc Af Amer: 40 mL/min — ABNORMAL LOW (ref >60)
GFR calc non Af Amer: 34 mL/min — ABNORMAL LOW (ref >60)
Glucose, Bld: 292 mg/dL — ABNORMAL HIGH (ref 70–99)
Potassium: 4.4 mmol/L (ref 3.5–5.1)
Sodium: 139 mmol/L (ref 135–145)
Total Bilirubin: 0.6 mg/dL (ref 0.3–1.2)
Total Protein: 6.6 g/dL (ref 6.5–8.1)

## 2018-09-14 LAB — CBC
HCT: 29.5 % — ABNORMAL LOW (ref 36.0–46.0)
Hemoglobin: 9.6 g/dL — ABNORMAL LOW (ref 12.0–15.0)
MCH: 28.5 pg (ref 26.0–34.0)
MCHC: 32.5 g/dL (ref 30.0–36.0)
MCV: 87.5 fL (ref 80.0–100.0)
Platelets: 266 10*3/uL (ref 150–400)
RBC: 3.37 MIL/uL — ABNORMAL LOW (ref 3.87–5.11)
RDW: 16.6 % — ABNORMAL HIGH (ref 11.5–15.5)
WBC: 17 10*3/uL — ABNORMAL HIGH (ref 4.0–10.5)
nRBC: 0 % (ref 0.0–0.2)

## 2018-09-14 LAB — PROTIME-INR
INR: 1 (ref 0.8–1.2)
Prothrombin Time: 13.3 seconds (ref 11.4–15.2)

## 2018-09-14 LAB — SARS CORONAVIRUS 2 BY RT PCR (HOSPITAL ORDER, PERFORMED IN ~~LOC~~ HOSPITAL LAB): SARS Coronavirus 2: NEGATIVE

## 2018-09-14 LAB — GLUCOSE, CAPILLARY: Glucose-Capillary: 189 mg/dL — ABNORMAL HIGH (ref 70–99)

## 2018-09-14 MED ORDER — ONDANSETRON HCL 4 MG/2ML IJ SOLN: 4.0000 mg | Freq: Four times a day (QID) | INTRAMUSCULAR | Status: DC | PRN

## 2018-09-14 MED ORDER — VITAMIN B-12 1000 MCG PO TABS
500.0000 ug | ORAL_TABLET | Freq: Every day | ORAL | Status: DC
  Administered 2018-09-15 – 2018-09-17 (×2): 500 ug via ORAL
  Filled 2018-09-14 (×2): qty 1

## 2018-09-14 MED ORDER — ORAL CARE MOUTH RINSE
15.0000 mL | Freq: Two times a day (BID) | OROMUCOSAL | Status: DC
  Administered 2018-09-14 – 2018-09-16 (×4): 15 mL via OROMUCOSAL

## 2018-09-14 MED ORDER — UMECLIDINIUM BROMIDE 62.5 MCG/INH IN AEPB
1.0000 | INHALATION_SPRAY | Freq: Every day | RESPIRATORY_TRACT | Status: DC
  Administered 2018-09-15 – 2018-09-17 (×3): 1 via RESPIRATORY_TRACT
  Filled 2018-09-14: qty 7

## 2018-09-14 MED ORDER — FLUTICASONE FUROATE-VILANTEROL 100-25 MCG/INH IN AEPB
1.0000 | INHALATION_SPRAY | Freq: Every day | RESPIRATORY_TRACT | Status: DC
  Administered 2018-09-15 – 2018-09-17 (×3): 1 via RESPIRATORY_TRACT
  Filled 2018-09-14: qty 28

## 2018-09-14 MED ORDER — SODIUM CHLORIDE 0.9 % IV SOLN
80.0000 mg | Freq: Once | INTRAVENOUS | Status: AC
  Administered 2018-09-14: 80 mg via INTRAVENOUS
  Filled 2018-09-14: qty 80

## 2018-09-14 MED ORDER — CYCLOBENZAPRINE HCL 10 MG PO TABS
10.0000 mg | ORAL_TABLET | Freq: Every day | ORAL | Status: DC | PRN
  Administered 2018-09-15: 10 mg via ORAL
  Filled 2018-09-14: qty 1

## 2018-09-14 MED ORDER — ONDANSETRON HCL 4 MG/2ML IJ SOLN
4.0000 mg | Freq: Once | INTRAMUSCULAR | Status: AC
  Administered 2018-09-14: 4 mg via INTRAVENOUS
  Filled 2018-09-14: qty 2

## 2018-09-14 MED ORDER — FLUTICASONE-UMECLIDIN-VILANT 100-62.5-25 MCG/INH IN AEPB: 1.0000 | INHALATION_SPRAY | Freq: Every day | RESPIRATORY_TRACT | Status: DC

## 2018-09-14 MED ORDER — IOHEXOL 300 MG/ML  SOLN
75.0000 mL | Freq: Once | INTRAMUSCULAR | Status: AC | PRN
  Administered 2018-09-14: 75 mL via INTRAVENOUS

## 2018-09-14 MED ORDER — MONTELUKAST SODIUM 10 MG PO TABS
10.0000 mg | ORAL_TABLET | Freq: Every day | ORAL | Status: DC
  Administered 2018-09-14 – 2018-09-16 (×3): 10 mg via ORAL
  Filled 2018-09-14 (×3): qty 1

## 2018-09-14 MED ORDER — SODIUM CHLORIDE 0.9 % IV SOLN
8.0000 mg/h | INTRAVENOUS | Status: DC
  Administered 2018-09-14 – 2018-09-16 (×3): 8 mg/h via INTRAVENOUS
  Filled 2018-09-14 (×4): qty 80

## 2018-09-14 MED ORDER — SODIUM CHLORIDE 0.9 % IV SOLN
Freq: Once | INTRAVENOUS | Status: AC
  Administered 2018-09-14: 20:00:00 via INTRAVENOUS

## 2018-09-14 MED ORDER — ACETAMINOPHEN 650 MG RE SUPP: 650.0000 mg | Freq: Four times a day (QID) | RECTAL | Status: DC | PRN

## 2018-09-14 MED ORDER — ACETAMINOPHEN 325 MG PO TABS
650.0000 mg | ORAL_TABLET | Freq: Four times a day (QID) | ORAL | Status: DC | PRN
  Administered 2018-09-14 – 2018-09-17 (×3): 650 mg via ORAL
  Filled 2018-09-14 (×3): qty 2

## 2018-09-14 MED ORDER — FERROUS SULFATE 325 (65 FE) MG PO TABS
325.0000 mg | ORAL_TABLET | Freq: Every day | ORAL | Status: DC
  Administered 2018-09-15 – 2018-09-17 (×2): 325 mg via ORAL
  Filled 2018-09-14 (×2): qty 1

## 2018-09-14 MED ORDER — INSULIN ASPART 100 UNIT/ML ~~LOC~~ SOLN: 0.0000 [IU] | Freq: Every day | SUBCUTANEOUS | Status: DC

## 2018-09-14 MED ORDER — ONDANSETRON HCL 4 MG PO TABS: 4.0000 mg | ORAL_TABLET | Freq: Four times a day (QID) | ORAL | Status: DC | PRN

## 2018-09-14 MED ORDER — SODIUM CHLORIDE 0.9 % IV SOLN
INTRAVENOUS | Status: DC
  Administered 2018-09-14 – 2018-09-17 (×6): via INTRAVENOUS

## 2018-09-14 MED ORDER — PANTOPRAZOLE SODIUM 40 MG IV SOLR: 40.0000 mg | Freq: Two times a day (BID) | INTRAVENOUS | Status: DC

## 2018-09-14 MED ORDER — DARIFENACIN HYDROBROMIDE ER 7.5 MG PO TB24
15.0000 mg | ORAL_TABLET | Freq: Every day | ORAL | Status: DC
  Administered 2018-09-15 – 2018-09-17 (×2): 15 mg via ORAL
  Filled 2018-09-14 (×3): qty 2
  Filled 2018-09-14: qty 1

## 2018-09-14 MED ORDER — INSULIN ASPART 100 UNIT/ML ~~LOC~~ SOLN: 0.0000 [IU] | Freq: Three times a day (TID) | SUBCUTANEOUS | Status: DC

## 2018-09-14 MED ORDER — ATORVASTATIN CALCIUM 10 MG PO TABS
10.0000 mg | ORAL_TABLET | Freq: Every day | ORAL | Status: DC
  Administered 2018-09-15 – 2018-09-17 (×2): 10 mg via ORAL
  Filled 2018-09-14 (×2): qty 1

## 2018-09-14 NOTE — H&P (Signed)
[facility] [facility] [place] NAME: Karen [name]    MR#:  [id number]  DATE OF BIRTH:  1945/12/12  DATE OF ADMISSION:  09/14/2018  PRIMARY CARE PHYSICIAN: Ricardo Jericho, NP   REQUESTING/REFERRING PHYSICIAN: Dr. Lenise Arena  CHIEF COMPLAINT:   Chief Complaint  Patient presents with  . Rectal Bleeding    HISTORY OF PRESENT ILLNESS:  Karen [name]  is a 73 y.o. female with a known history of chronic kidney disease stage III, COPD, diabetes, hypertension, hyperlipidemia, history of overactive bladder, who presents to the hospital due to multiple episodes of rectal bleeding and also having some coffee-ground emesis in the ER.  Patient says she had 4-5 episodes of rectal bleeding at home which was mostly blood and liquid but no clots.  She came to the ER and had one episode of coffee-ground emesis.  She also complains of abdominal pain which is crampy in nature, but no fever, chills or any other associated symptoms.  Patient CT scan of the abdomen pelvis was positive for just diverticulosis but no other acute pathology.  Patient is being admitted to the hospital for work-up and treatment of her GI bleed.  Patient's COVID-19 test is negative.  PAST MEDICAL HISTORY:   Past Medical History:  Diagnosis Date  . (HFpEF) heart failure with preserved ejection fraction (Karen [name]) 2017   (1) TTE 2017 a. EF 50% b. mild LVH c. mild MR/TR, d. mild LAE e. mild pulmonary HTN  (2) TTE 06/2018 a. EF 50-55%, mild AS with thickening of valve   . Acute on chronic respiratory failure with hypoxia and hypercapnia ([place]) 01/07/2015  . Anemia   . 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 Asthma   . Cataract   . Chronic kidney disease 08/10/2017  . CKD (chronic kidney disease)   . COPD (chronic obstructive pulmonary disease) ([place])    (1) 06/2018 tobacco use, home 3L oxygen   . Diabetes mellitus without complication (HCC)    (1) A1C 7.7 (06/2018)  . Edema, peripheral 04/20/2014  .  History of kidney stones   . Hyperlipidemia   . Hypertension   . Iron deficiency anemia 06/22/2014  . Leucocytosis 10/19/2015  . Overactive bladder   . Primary osteoarthritis of right knee 09/01/2016  . Renal insufficiency   . Sciatica 01/07/2015    PAST SURGICAL HISTORY:   Past Surgical History:  Procedure Laterality Date  . APPENDECTOMY    . CESAREAN SECTION     x3  . CHOLECYSTECTOMY    . COLONOSCOPY WITH PROPOFOL N/A 08/28/2017   Procedure: COLONOSCOPY WITH PROPOFOL;  Surgeon: Lucilla Lame, MD;  Location: [facility] ENDOSCOPY;  Service: Endoscopy;  Laterality: N/A;  . COLONOSCOPY WITH PROPOFOL N/A 08/29/2017   Procedure: COLONOSCOPY WITH PROPOFOL;  Surgeon: Lucilla Lame, MD;  Location: [facility] ENDOSCOPY;  Service: Endoscopy;  Laterality: N/A;  . CYSTOSCOPY W/ URETERAL STENT PLACEMENT Right 09/15/2017   Procedure: CYSTOSCOPY WITH RETROGRADE PYELOGRAM/URETERAL STENT PLACEMENT;  Surgeon: Cleon Gustin, MD;  Location: [facility] ORS;  Service: Urology;  Laterality: Right;  . CYSTOSCOPY/URETEROSCOPY/HOLMIUM LASER/STENT PLACEMENT Right 10/09/2017   Procedure: CYSTOSCOPY/URETEROSCOPY/HOLMIUM LASER/STENT PLACEMENT;  Surgeon: Abbie Sons, MD;  Location: [facility] ORS;  Service: Urology;  Laterality: Right;  right Stent exchange  . EYE SURGERY      SOCIAL HISTORY:   Social History   Tobacco Use  . Smoking status: Former Smoker    Packs/day: 1.00    Years: 20.00    Pack years: 20.00  Quit date: 12/04/1992    Years since quitting: 25.7  . Smokeless tobacco: Never Used  Substance Use Topics  . Alcohol use: No    FAMILY HISTORY:   Family History  Problem Relation Age of Onset  . Other Mother        unknown medical history  . Other Father        unknown medical history    DRUG ALLERGIES:   Allergies  Allergen Reactions  . Ace Inhibitors   . Gabapentin Hives  . Lisinopril   . Lyrica [Pregabalin]   . Shrimp [Shellfish Allergy] Swelling    Swelling of the lips    REVIEW OF  SYSTEMS:   Review of Systems  Constitutional: Negative for fever and weight loss.  HENT: Negative for congestion, nosebleeds and tinnitus.   Eyes: Negative for blurred vision, double vision and redness.  Respiratory: Negative for cough, hemoptysis and shortness of breath.   Cardiovascular: Negative for chest pain, orthopnea, leg swelling and PND.  Gastrointestinal: Positive for blood in stool. Negative for abdominal pain, diarrhea, melena, nausea and vomiting.  Genitourinary: Negative for dysuria, hematuria and urgency.  Musculoskeletal: Negative for falls and joint pain.  Neurological: Negative for dizziness, tingling, sensory change, focal weakness, seizures, weakness and headaches.  Endo/Heme/Allergies: Negative for polydipsia. Does not bruise/bleed easily.  Psychiatric/Behavioral: Negative for depression and memory loss. The patient is not nervous/anxious.     MEDICATIONS AT HOME:   Prior to Admission medications   Medication Sig Start Date End Date Taking? Authorizing Provider  aspirin 81 MG chewable tablet Chew 81 mg by mouth daily. On hold    [provider]  atorvastatin (LIPITOR) 10 MG tablet Take 1 tablet by mouth daily.    [provider]  canagliflozin (INVOKANA) 100 MG TABS tablet Take 1 tablet by mouth daily.    [provider]  cyclobenzaprine (FLEXERIL) 10 MG tablet Take 1 tablet (10 mg total) by mouth 3 (three) times daily as needed for muscle spasms. Patient taking differently: Take 10 mg by mouth daily as needed for muscle spasms.  08/02/18   Norval Gable, MD  Ferrous Sulfate (IRON) 325 (65 Fe) MG TABS Take 1 tablet by mouth daily. 09/14/17   [provider]  fluticasone furoate-vilanterol (BREO ELLIPTA) 100-25 MCG/INH AEPB Inhale 1 puff into the lungs daily.    [provider]  Fluticasone-Umeclidin-Vilant 100-62.5-25 MCG/INH AEPB Inhale 1 puff into the lungs daily. 12/25/17   [provider]  furosemide (LASIX) 40  MG tablet Take 1 tablet (40 mg total) by mouth 2 (two) times daily. 08/29/18   [name], MD  Insulin Human (INSULIN PUMP) SOLN Inject 1 each into the skin 3 times daily with meals, bedtime and 2 AM. Patient taking differently: Inject 1 each into the skin 3 times daily with meals, bedtime and 2 AM. Pt uses Novolog 09/18/17   Dustin Flock, MD  losartan (COZAAR) 100 MG tablet Take 100 mg by mouth daily. 09/12/17   [provider]  metFORMIN (GLUCOPHAGE) 500 MG tablet Take 1,000 mg by mouth 2 (two) times daily with a meal.     [provider]  montelukast (SINGULAIR) 10 MG tablet Take 10 mg by mouth at bedtime.    [provider]  solifenacin (VESICARE) 10 MG tablet Take 1 tablet (10 mg total) by mouth daily. 07/23/18   Stoioff, Ronda Fairly, MD  vitamin B-12 (CYANOCOBALAMIN) 500 MCG tablet Take 500 mcg by mouth daily.    [provider]      VITAL SIGNS:  Blood pressure (!) 151/76, pulse (!) 110, temperature 98.4 F (36.9 C), temperature source Axillary, resp. rate 20, height 5\' 3"  (1.6 m), weight 93.4 kg, SpO2 96 %.  PHYSICAL EXAMINATION:  Physical Exam  GENERAL:  73 y.o.-year-old obese patient lying in the bed with no acute distress.  EYES: Pupils equal, round, reactive to light and accommodation. No scleral icterus. Extraocular muscles intact.  HEENT: Head atraumatic, normocephalic. Oropharynx and nasopharynx clear. No oropharyngeal erythema, moist oral mucosa  NECK:  Supple, no jugular venous distention. No thyroid enlargement, no tenderness.  LUNGS: Normal breath sounds bilaterally, no wheezing, rales, rhonchi. No use of accessory muscles of respiration.  CARDIOVASCULAR: S1, S2 RRR. No murmurs, rubs, gallops, clicks.  ABDOMEN: Soft, nontender, nondistended. Bowel sounds present. No organomegaly or mass.  EXTREMITIES: No pedal edema, cyanosis, or clubbing. + 2 pedal & radial pulses b/l.   NEUROLOGIC: Cranial nerves II through XII are intact. No focal  Motor or sensory deficits appreciated b/l. Globally weak.  PSYCHIATRIC: The patient is alert and oriented x 3.  SKIN: No obvious rash, lesion, or ulcer.   LABORATORY PANEL:   CBC Recent Labs  Lab 09/14/18 1901  WBC 17.0*  HGB 9.6*  HCT 29.5*  PLT 266   ------------------------------------------------------------------------------------------------------------------  Chemistries  Recent Labs  Lab 09/14/18 1901  NA 139  K 4.4  CL 94*  CO2 30  GLUCOSE 292*  BUN 66*  CREATININE 1.49*  CALCIUM 9.3  AST 20  ALT 13  ALKPHOS 52  BILITOT 0.6   ------------------------------------------------------------------------------------------------------------------  Cardiac Enzymes No results for input(s): TROPONINI in the last 168 hours. ------------------------------------------------------------------------------------------------------------------  RADIOLOGY:  Ct Abdomen Pelvis W Contrast  Result Date: 09/14/2018 CLINICAL DATA:  Rectal bleeding. EXAM: CT ABDOMEN AND PELVIS WITH CONTRAST TECHNIQUE: Multidetector CT imaging of the abdomen and pelvis was performed using the standard protocol following bolus administration of intravenous contrast. CONTRAST:  42mL OMNIPAQUE IOHEXOL 300 MG/ML  SOLN COMPARISON:  September 15, 2017 FINDINGS: Lower chest: No acute abnormality. Hepatobiliary: No focal liver abnormality is seen. No gallstones, gallbladder wall thickening, or biliary dilatation. Pancreas: Unremarkable. No pancreatic ductal dilatation or surrounding inflammatory changes. Spleen: Normal in size without focal abnormality. Adrenals/Urinary Tract: The adrenal glands, kidneys, and ureters are normal. There is a small amount of air in the anterior bladder. The bladder is otherwise normal. Stomach/Bowel: Stomach and small bowel are normal. There are colonic diverticuli seen diffusely throughout the colon. The diverticuli are most numerous in the sigmoid colon. No focal diverticulitis is  identified. The appendix is not seen but there is no secondary evidence of appendicitis. By report, the patient is status post appendectomy. Vascular/Lymphatic: Atherosclerotic changes are seen in the nonaneurysmal aorta. No adenopathy. Reproductive: Uterus and bilateral adnexa are unremarkable. Other: No free air or free fluid. Musculoskeletal: No acute or significant osseous findings. IMPRESSION: 1. Diverticulosis throughout the colon but most numerous in the sigmoid colon. No focal diverticulitis. 2. There is a small amount of air in the bladder. This may be due to recent catheterization. Recommend clinical correlation. 3. Atherosclerotic changes in the nonaneurysmal aorta. Electronically Signed   By: Dorise Bullion III M.D   On: 09/14/2018 20:42     IMPRESSION AND PLAN:   73 y.o. female with a known history of chronic kidney disease stage III, COPD, diabetes, hypertension, hyperlipidemia, history of overactive bladder, who presents to the hospital due to multiple episodes of rectal bleeding and also having some coffee-ground emesis  in the ER.  1.  GI bleed-this is the cause of patient's rectal bleeding and coffee-ground emesis.  Unclear if this is a brisk upper GI bleed versus a lower GI bleed. -Hemoglobin currently stable.  Patient CT abdomen pelvis is showing just diverticulosis and no other acute pathology. - We will place the patient on clear liquid diet continue Protonix drip, will get a gastroenterology consult.  Hold aspirin for now.  2.  Anemia-suspected to be acute blood loss anemia due to GI bleed. -Patient's hemoglobin in June of this past year was 11.2 and today is 9.6. -We will follow serial hemoglobins.  Transfuse if hemoglobin is less than 7. -Hold aspirin for now.  3.  Diabetes type 2 with neuropathy- hold metformin, scheduled insulin. -Place on sliding scale insulin for now.  4.  COPD-no acute exacerbation. -Continue maintenance inhalers with Breo Ellipta.  5.  History of  urinary incontinence-continue Vesicare.  6.  Essential hypertension- patient's blood pressures on the soft side given the GI bleed. -Hold Lasix, losartan for now.  7.  Hyperlipidemia-continue atorvastatin.    All the records are reviewed and case discussed with ED provider. Management plans discussed with the patient, family and they are in agreement.  CODE STATUS: DNR  TOTAL TIME TAKING CARE OF THIS PATIENT: 40 minutes.    Henreitta Leber M.D on 09/14/2018 at 9:30 PM  Between 7am to 6pm - Pager - [phone]  After 6pm go to [email or web address] - password [email or web address] [facility]  [facility]  Office  [phone]  CC: Primary care physician; Ricardo Jericho, NP

## 2018-09-14 NOTE — ED Notes (Signed)
Spouse called to provide update with no answer.

## 2018-09-14 NOTE — ED Provider Notes (Signed)
[facility] Emergency Department Provider Note       Time seen: ----------------------------------------- 7:01 PM on 09/14/2018 -----------------------------------------   I have reviewed the triage vital signs and the nursing notes.  HISTORY   Chief Complaint Rectal Bleeding    HPI Karen [name] is a 73 y.o. female with a history of heart failure, anemia, chronic kidney disease, COPD, hypertension, hyperlipidemia who presents to the ED for gastrointestinal bleeding.  She arrives by EMS from home for rectal bleeding.  She started having bloody bowel movements today at 11 AM with a history of same x4.  She was feeling dizzy and weak, on arrival she started having coffee-ground emesis.  Pain is 10 out of 10 in the abdomen.  Past Medical History:  Diagnosis Date  . (HFpEF) heart failure with preserved ejection fraction ([place]) 2017   (1) TTE 2017 a. EF 50% b. mild LVH c. mild MR/TR, d. mild LAE e. mild pulmonary HTN  (2) TTE 06/2018 a. EF 50-55%, mild AS with thickening of valve   . Acute on chronic respiratory failure with hypoxia and hypercapnia ([place]) 01/07/2015  . Anemia   . 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 Asthma   . Cataract   . Chronic kidney disease 08/10/2017  . CKD (chronic kidney disease)   . COPD (chronic obstructive pulmonary disease) ([place])    (1) 06/2018 tobacco use, home 3L oxygen   . Diabetes mellitus without complication (HCC)    (1) A1C 7.7 (06/2018)  . Edema, peripheral 04/20/2014  . History of kidney stones   . Hyperlipidemia   . Hypertension   . Iron deficiency anemia 06/22/2014  . Leucocytosis 10/19/2015  . Overactive bladder   . Primary osteoarthritis of right knee 09/01/2016  . Renal insufficiency   . Sciatica 01/07/2015    Patient Active Problem List   Diagnosis Date Noted  . Chronic diastolic heart failure ([place]) 08/22/2018  . Diarrhea 08/22/2018  . Junctional bradycardia   . Acute on chronic heart failure with preserved ejection fraction  (HFpEF) ([place])   . AKI (acute kidney injury) ([place])   . Symptomatic bradycardia 08/05/2018  . Acute on chronic respiratory failure ([place]) 06/25/2018  . 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 diabetes mellitus ([place]) 01/25/2018  . History of non anemic vitamin B12 deficiency 01/25/2018  . Personal history of kidney stones 11/12/2017  . Urge incontinence 11/12/2017  . Right ureteral stone 09/15/2017  . Acute GI bleeding   . GI bleed 08/26/2017  . Arthritis 08/10/2017  . Chronic kidney disease 08/10/2017  . COPD (chronic obstructive pulmonary disease) ([place]) 08/10/2017  . Diabetes mellitus type 2, uncomplicated ([place]) 15/72/6203  . Hypertension 08/10/2017  . Obesity (BMI 35.0-39.9 without comorbidity) 04/11/2017  . Primary osteoarthritis of right knee 09/01/2016  . Leucocytosis 10/19/2015  . 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 Acute on chronic respiratory failure with hypoxia and hypercapnia ([place]) 01/07/2015  . Sciatica 01/07/2015  . Weakness 01/07/2015  . 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 Iron deficiency anemia 06/22/2014  . Microalbuminuria 06/22/2014  . CHF (congestive heart failure) ([place]) 04/20/2014  . Edema, peripheral 04/20/2014    Past Surgical History:  Procedure Laterality Date  . APPENDECTOMY    . CESAREAN SECTION     x3  . CHOLECYSTECTOMY    . COLONOSCOPY WITH PROPOFOL N/A 08/28/2017   Procedure: COLONOSCOPY WITH PROPOFOL;  Surgeon: Lucilla Lame, MD;  Location: [facility] ENDOSCOPY;  Service: Endoscopy;  Laterality: N/A;  . COLONOSCOPY WITH PROPOFOL N/A  08/29/2017   Procedure: COLONOSCOPY WITH PROPOFOL;  Surgeon: Lucilla Lame, MD;  Location: [facility] ENDOSCOPY;  Service: Endoscopy;  Laterality: N/A;  . CYSTOSCOPY W/ URETERAL STENT PLACEMENT Right 09/15/2017   Procedure: CYSTOSCOPY WITH RETROGRADE PYELOGRAM/URETERAL STENT PLACEMENT;  Surgeon: Cleon Gustin, MD;  Location: [facility] ORS;  Service: Urology;  Laterality: Right;  .  CYSTOSCOPY/URETEROSCOPY/HOLMIUM LASER/STENT PLACEMENT Right 10/09/2017   Procedure: CYSTOSCOPY/URETEROSCOPY/HOLMIUM LASER/STENT PLACEMENT;  Surgeon: Abbie Sons, MD;  Location: [facility] ORS;  Service: Urology;  Laterality: Right;  right Stent exchange  . EYE SURGERY      Allergies Ace inhibitors, Gabapentin, Lisinopril, Lyrica [pregabalin], and Shrimp [shellfish allergy]  Social History Social History   Tobacco Use  . Smoking status: Former Smoker    Quit date: 12/04/1992    Years since quitting: 25.7  . Smokeless tobacco: Never Used  Substance Use Topics  . Alcohol use: No  . Drug use: No   Review of Systems Constitutional: Negative for fever. Cardiovascular: Negative for chest pain. Respiratory: Negative for shortness of breath. Gastrointestinal: Positive for abdominal pain, rectal bleeding, coffee-ground emesis Musculoskeletal: Negative for back pain. Skin: Negative for rash. Neurological: Negative for headaches, positive for weakness  All systems negative/normal/unremarkable except as stated in the HPI  ____________________________________________   PHYSICAL EXAM:  VITAL SIGNS: ED Triage Vitals  Enc Vitals Group     BP 09/14/18 1858 (!) 151/76     Pulse Rate 09/14/18 1858 (!) 110     Resp 09/14/18 1858 20     Temp 09/14/18 1858 98.4 F (36.9 C)     Temp Source 09/14/18 1858 Axillary     SpO2 09/14/18 1858 96 %     Weight 09/14/18 1856 206 lb (93.4 kg)     Height 09/14/18 1856 5\' 3"  (1.6 m)     Head Circumference --      Peak Flow --      Pain Score 09/14/18 1856 10     Pain Loc --      Pain Edu? --      Excl. in [place]? --    Constitutional: Alert and oriented.  Moderate distress, actively vomiting Eyes: Conjunctivae are normal. Normal extraocular movements. ENT      Head: Normocephalic and atraumatic.      Nose: No congestion/rhinnorhea.      Mouth/Throat: Mucous membranes are moist.      Neck: No stridor. Cardiovascular: Normal rate, regular rhythm. No  murmurs, rubs, or gallops. Respiratory: Normal respiratory effort without tachypnea nor retractions. Breath sounds are clear and equal bilaterally. No wheezes/rales/rhonchi. Gastrointestinal: Distended, normal bowel sounds Musculoskeletal: Nontender with normal range of motion in extremities. No lower extremity tenderness nor edema. Neurologic:  Normal speech and language. No gross focal neurologic deficits are appreciated.  Skin:  Skin is warm, dry and intact. No rash noted. Psychiatric: Mood and affect are normal. Speech and behavior are normal.  ____________________________________________  EKG: Interpreted by me, sinus rhythm with rate of 96 bpm, low voltage, borderline long QT, normal axis  ____________________________________________  ED COURSE:  As part of my medical decision making, I reviewed the following data within the [place] History obtained from family if available, nursing notes, old chart and ekg, as well as notes from prior ED visits. Patient presented for gastrointestinal bleeding, we will assess with labs and imaging as indicated at this time.   Procedures  Karen [name] was evaluated in Emergency Department on 09/14/2018 for the symptoms described in the history of present  illness. She was evaluated in the context of the global COVID-19 pandemic, which necessitated consideration that the patient might be at risk for infection with the SARS-CoV-2 virus that causes COVID-19. Institutional protocols and algorithms that pertain to the evaluation of patients at risk for COVID-19 are in a state of rapid change based on information released by regulatory bodies including the CDC and federal and state organizations. These policies and algorithms were followed during the patient's care in the ED.  ____________________________________________   LABS (pertinent positives/negatives)  Labs Reviewed  COMPREHENSIVE METABOLIC PANEL - Abnormal; Notable for the  following components:      Result Value   Chloride 94 (*)    Glucose, Bld 292 (*)    BUN 66 (*)    Creatinine, Ser 1.49 (*)    GFR calc non Af Amer 34 (*)    GFR calc Af Amer 40 (*)    All other components within normal limits  CBC - Abnormal; Notable for the following components:   WBC 17.0 (*)    RBC 3.37 (*)    Hemoglobin 9.6 (*)    HCT 29.5 (*)    RDW 16.6 (*)    All other components within normal limits  SARS CORONAVIRUS 2 (HOSPITAL ORDER, [facility])  PROTIME-INR  POC OCCULT BLOOD, ED  TYPE AND SCREEN   CRITICAL CARE Performed by: Laurence Aly   Total critical care time: 30 minutes  Critical care time was exclusive of separately billable procedures and treating other patients.  Critical care was necessary to treat or prevent imminent or life-threatening deterioration.  Critical care was time spent personally by me on the following activities: development of treatment plan with patient and/or surrogate as well as nursing, discussions with consultants, evaluation of patient's response to treatment, examination of patient, obtaining history from patient or surrogate, ordering and performing treatments and interventions, ordering and review of laboratory studies, ordering and review of radiographic studies, pulse oximetry and re-evaluation of patient's condition.  RADIOLOGY Images were viewed by me  CT the abdomen pelvis with contrast IMPRESSION: 1. Diverticulosis throughout the colon but most numerous in the sigmoid colon. No focal diverticulitis. 2. There is a small amount of air in the bladder. This may be due to recent catheterization. Recommend clinical correlation. 3. Atherosclerotic changes in the nonaneurysmal aorta. ____________________________________________   DIFFERENTIAL DIAGNOSIS   Gastrointestinal bleeding, anemia, coagulopathy, peptic ulcer disease  FINAL ASSESSMENT AND PLAN  Acute gastrointestinal  bleeding   Plan: The patient had presented for gastrointestinal bleeding. Patient's labs did indicate acute anemia compared to prior as well as an elevated BUN compared to prior suggestive of gastrointestinal bleeding. Patient's imaging revealed diverticulosis without other acute process.  She may very well have an upper and lower GI bleeding source.  She has been treated with IV Zofran and Protonix.  Currently she is medically cleared for admission.   Laurence Aly, MD    Note: This note was generated in part or whole with voice recognition software. Voice recognition is usually quite accurate but there are transcription errors that can and very often do occur. I apologize for any typographical errors that were not detected and corrected.     Earleen Newport, MD 09/14/18 2056

## 2018-09-14 NOTE — Progress Notes (Signed)
   [facility] Day: 0 days Karen [name] is a 73 y.o. female  chronic kidney disease stage III, COPD, diabetes, hypertension, hyperlipidemia, history of overactive bladder, who presents to the hospital due to multiple episodes of rectal bleeding and also having some coffee-ground emesis.   Discussed CODE STATUS with patient at bedside.  Explained to her the difference between DNR and a full code.  Patient does not want any heroic measures or cardiopulmonary resuscitation to save her life.  Patient wants to be a DNR.  Advance care planning discussed with patient  without additional Family at bedside. All questions in regards to overall condition and expected prognosis answered. The decision was made to continue current code status  CODE STATUS: dnr Time spent: 16 minutes

## 2018-09-14 NOTE — ED Notes (Signed)
Husband Aracely Rickett [phone] would like to be updated on pt's status.

## 2018-09-14 NOTE — ED Notes (Signed)
Upon arrival to ED pt is vomiting coffee ground emesis. EDP at bedside.

## 2018-09-14 NOTE — ED Notes (Signed)
ED TO INPATIENT HANDOFF REPORT  ED Nurse Name and Phone #: Wells Guiles [phone]  B Name/Age/Gender Karen [name] 73 y.o. female Room/Bed: ED03A/ED03A  Code Status   Code Status: Prior  Home/SNF/Other Home Patient oriented to: self, place, time and situation Is this baseline? Yes   Triage Complete: Triage complete  Chief Complaint gi bleed  Triage Note Pt arrives [facility] from home for rectal bleeding. Started having bloody BM's today at 11am. Hx of same x 4. Pt states feeling dizzy/weak. No blood thinner use. Denies recent falls. A&O upon arrival. Wanted to ambulate from EMS stretcher to ED stretcher.   CBG 296, 99/45, wears 3 L [place] chronically. HR 103.    Allergies Allergies  Allergen Reactions  . Ace Inhibitors Hives  . Gabapentin Hives  . Lisinopril Hives  . Lyrica [Pregabalin] Hives  . Shrimp [Shellfish Allergy] Swelling    Swelling of the lips    Level of Care/Admitting Diagnosis ED Disposition    ED Disposition Condition [facility] Area: [place] [[id number]]  Level of Care: Med-Surg [16]  Covid Evaluation: Confirmed COVID Negative  Diagnosis: GI bleed [[id number]]  Admitting Physician: Henreitta Leber [[id number]]  Attending Physician: Henreitta Leber [[id number]]  Estimated length of stay: past midnight tomorrow  Certification:: I certify this patient will need inpatient services for at least 2 midnights  PT Class (Do Not Modify): Inpatient [101]  PT Acc Code (Do Not Modify): Private [1]       B Medical/Surgery History Past Medical History:  Diagnosis Date  . (HFpEF) heart failure with preserved ejection fraction ([place]) 2017   (1) TTE 2017 a. EF 50% b. mild LVH c. mild MR/TR, d. mild LAE e. mild pulmonary HTN  (2) TTE 06/2018 a. EF 50-55%, mild AS with thickening of valve   . Acute on chronic respiratory failure with hypoxia and hypercapnia ([place]) 01/07/2015  . Anemia   . 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 Asthma   . Cataract   . Chronic  kidney disease 08/10/2017  . CKD (chronic kidney disease)   . COPD (chronic obstructive pulmonary disease) ([place])    (1) 06/2018 tobacco use, home 3L oxygen   . Diabetes mellitus without complication (HCC)    (1) A1C 7.7 (06/2018)  . Edema, peripheral 04/20/2014  . History of kidney stones   . Hyperlipidemia   . Hypertension   . Iron deficiency anemia 06/22/2014  . Leucocytosis 10/19/2015  . Overactive bladder   . Primary osteoarthritis of right knee 09/01/2016  . Renal insufficiency   . Sciatica 01/07/2015   Past Surgical History:  Procedure Laterality Date  . APPENDECTOMY    . CESAREAN SECTION     x3  . CHOLECYSTECTOMY    . COLONOSCOPY WITH PROPOFOL N/A 08/28/2017   Procedure: COLONOSCOPY WITH PROPOFOL;  Surgeon: Lucilla Lame, MD;  Location: [facility] ENDOSCOPY;  Service: Endoscopy;  Laterality: N/A;  . COLONOSCOPY WITH PROPOFOL N/A 08/29/2017   Procedure: COLONOSCOPY WITH PROPOFOL;  Surgeon: Lucilla Lame, MD;  Location[facility] END[facility]COPY;  Service: Endoscopy;  Laterality: N/A;  . CYSTOSCOPY W/ URETERAL STENT PLACEMENT Right 09/15/2017   Procedure: CYSTOSCOPY WITH RETROGRADE PYELOGRAM/URETERAL STENT PLACEMENT;  Surgeon: Cleon Gustin, MD;  Location: [facility] ORS;  Service: Urology;  Laterality: Right;  . CYSTOSCOPY/URETEROSCOPY/HOLMIUM LASER/STENT PLACEMENT Right 10/09/2017   Procedure: CYSTOSCOPY/URETEROSCOPY/HOLMIUM LASER/STENT PLACEMENT;  Surgeon: Abbie Sons, MD;  Location: [facility] ORS;  Service: Urology;  Laterality: Right;  right Stent exchange  . EYE SURGERY  A IV Location/Drains/Wounds Patient Lines/Drains/Airways Status   Active Line/Drains/Airways    Name:   Placement date:   Placement time:   Site:   Days:   Peripheral IV 09/14/18 Right Antecubital   09/14/18    1903    Antecubital   less than 1   Peripheral IV 09/14/18 Left Forearm   09/14/18    1948    Forearm   less than 1   Ureteral Drain/Stent Right ureter 6 Fr.   10/09/17    0935    Right ureter   340   External Urinary  Catheter   06/25/18    1428    -   81   Incision (Closed) 10/09/17 Vagina Other (Comment)   10/09/17    0920     340          Intake/Output Last 24 hours No intake or output data in the 24 hours ending 09/14/18 2145  Labs/Imaging Results for orders placed or performed during the hospital encounter of 09/14/18 (from the past 48 hour(s))  Comprehensive metabolic panel     Status: Abnormal   Collection Time: 09/14/18  7:01 PM  Result Value Ref Range   Sodium 139 135 - 145 mmol/L   Potassium 4.4 3.5 - 5.1 mmol/L   Chloride 94 (L) 98 - 111 mmol/L   CO2 30 22 - 32 mmol/L   Glucose, Bld 292 (H) 70 - 99 mg/dL   BUN 66 (H) 8 - 23 mg/dL   Creatinine, Ser 1.49 (H) 0.44 - 1.00 mg/dL   Calcium 9.3 8.9 - 10.3 mg/dL   Total Protein 6.6 6.5 - 8.1 g/dL   Albumin 3.5 3.5 - 5.0 g/dL   AST 20 15 - 41 U/L   ALT 13 0 - 44 U/L   Alkaline Phosphatase 52 38 - 126 U/L   Total Bilirubin 0.6 0.3 - 1.2 mg/dL   GFR calc non Af Amer 34 (L) >60 mL/min   GFR calc Af Amer 40 (L) >60 mL/min   Anion gap 15 5 - 15    Comment: Performed at [facility], [address] [place], [place] [address]  CBC     Status: Abnormal   Collection Time: 09/14/18  7:01 PM  Result Value Ref Range   WBC 17.0 (H) 4.0 - 10.5 K/uL   RBC 3.37 (L) 3.87 - 5.11 MIL/uL   Hemoglobin 9.6 (L) 12.0 - 15.0 g/dL   HCT 29.5 (L) 36.0 - 46.0 %   MCV 87.5 80.0 - 100.0 fL   MCH 28.5 26.0 - 34.0 pg   MCHC 32.5 30.0 - 36.0 g/dL   RDW 16.6 (H) 11.5 - 15.5 %   Platelets 266 150 - 400 K/uL   nRBC 0.0 0.0 - 0.2 %    Comment: Performed at [facility], [address] [place], [place] [address]  Type and screen [place]     Status: None   Collection Time: 09/14/18  7:01 PM  Result Value Ref Range   ABO/RH(D) A NEG    Antibody Screen NEG    Sample Expiration      09/17/2018,2359 Performed at [facility], [address] [place], [place] [address]   Protime-INR     Status: None    Collection Time: 09/14/18  7:01 PM  Result Value Ref Range   Prothrombin Time 13.3 11.4 - 15.2 seconds   INR 1.0 0.8 - 1.2    Comment: (NOTE) INR goal varies  based on device and disease states. Performed at [facility], [address], [place], [place] [address]   SARS Coronavirus 2 (CEPHEID - Performed in [facility] hospital lab), Hosp Order     Status: None   Collection Time: 09/14/18  7:34 PM   Specimen: Nasopharyngeal Swab  Result Value Ref Range   SARS Coronavirus 2 NEGATIVE NEGATIVE    Comment: (NOTE) If result is NEGATIVE SARS-CoV-2 target nucleic acids are NOT DETECTED. The SARS-CoV-2 RNA is generally detectable in upper and lower  respiratory specimens during the acute phase of infection. The lowest  concentration of SARS-CoV-2 viral copies this assay can detect is 250  copies / mL. A negative result does not preclude SARS-CoV-2 infection  and should not be used as the sole basis for treatment or other  patient management decisions.  A negative result may occur with  improper specimen collection / handling, submission of specimen other  than nasopharyngeal swab, presence of viral mutation(s) within the  areas targeted by this assay, and inadequate number of viral copies  (<250 copies / mL). A negative result must be combined with clinical  observations, patient history, and epidemiological information. If result is POSITIVE SARS-CoV-2 target nucleic acids are DETECTED. The SARS-CoV-2 RNA is generally detectable in upper and lower  respiratory specimens dur ing the acute phase of infection.  Positive  results are indicative of active infection with SARS-CoV-2.  Clinical  correlation with patient history and other diagnostic information is  necessary to determine patient infection status.  Positive results do  not rule out bacterial infection or co-infection with other viruses. If result is PRESUMPTIVE POSTIVE SARS-CoV-2 nucleic acids MAY BE PRESENT.   A  presumptive positive result was obtained on the submitted specimen  and confirmed on repeat testing.  While 2019 novel coronavirus  (SARS-CoV-2) nucleic acids may be present in the submitted sample  additional confirmatory testing may be necessary for epidemiological  and / or clinical management purposes  to differentiate between  SARS-CoV-2 and other Sarbecovirus currently known to infect humans.  If clinically indicated additional testing with an alternate test  methodology [phone]) is advised. The SARS-CoV-2 RNA is generally  detectable in upper and lower respiratory sp ecimens during the acute  phase of infection. The expected result is Negative. Fact Sheet for Patients:  StrictlyIdeas.no Fact Sheet for Healthcare Providers: BankingDealers.co.za This test is not yet approved or cleared by the [place] FDA and has been authorized for detection and/or diagnosis of SARS-CoV-2 by FDA under an Emergency Use Authorization (EUA).  This EUA will remain in effect (meaning this test can be used) for the duration of the COVID-19 declaration under Section 564(b)(1) of the Act, 21 U.S.C. section 360bbb-3(b)(1), unless the authorization is terminated or revoked sooner. Performed at [facility], [address] [place], [place] [address]    Ct Abdomen Pelvis W Contrast  Result Date: 09/14/2018 CLINICAL DATA:  Rectal bleeding. EXAM: CT ABDOMEN AND PELVIS WITH CONTRAST TECHNIQUE: Multidetector CT imaging of the abdomen and pelvis was performed using the standard protocol following bolus administration of intravenous contrast. CONTRAST:  12mL OMNIPAQUE IOHEXOL 300 MG/ML  SOLN COMPARISON:  September 15, 2017 FINDINGS: Lower chest: No acute abnormality. Hepatobiliary: No focal liver abnormality is seen. No gallstones, gallbladder wall thickening, or biliary dilatation. Pancreas: Unremarkable. No pancreatic ductal dilatation or surrounding  inflammatory changes. Spleen: Normal in size without focal abnormality. Adrenals/Urinary Tract: The adrenal glands, kidneys, and ureters are normal. There is a small amount  of air in the anterior bladder. The bladder is otherwise normal. Stomach/Bowel: Stomach and small bowel are normal. There are colonic diverticuli seen diffusely throughout the colon. The diverticuli are most numerous in the sigmoid colon. No focal diverticulitis is identified. The appendix is not seen but there is no secondary evidence of appendicitis. By report, the patient is status post appendectomy. Vascular/Lymphatic: Atherosclerotic changes are seen in the nonaneurysmal aorta. No adenopathy. Reproductive: Uterus and bilateral adnexa are unremarkable. Other: No free air or free fluid. Musculoskeletal: No acute or significant osseous findings. IMPRESSION: 1. Diverticulosis throughout the colon but most numerous in the sigmoid colon. No focal diverticulitis. 2. There is a small amount of air in the bladder. This may be due to recent catheterization. Recommend clinical correlation. 3. Atherosclerotic changes in the nonaneurysmal aorta. Electronically Signed   By: Dorise Bullion III M.D   On: 09/14/2018 20:42    Pending Labs Unresulted Labs (From admission, onward)    Start     Ordered   Signed and Occupational hygienist morning,   R     Signed and Held   Signed and Held  CBC  Tomorrow morning,   R     Signed and Held          Vitals/Pain Today's Vitals   09/14/18 1856 09/14/18 1858  BP:  (!) 151/76  Pulse:  (!) 110  Resp:  20  Temp:  98.4 F (36.9 C)  TempSrc:  Axillary  SpO2:  96%  Weight: 93.4 kg   Height: 5\' 3"  (1.6 m)   PainSc: 10-Worst pain ever     Isolation Precautions No active isolations  Medications Medications  pantoprazole (PROTONIX) 80 mg in sodium chloride 0.9 % 250 mL (0.32 mg/mL) infusion (8 mg/hr Intravenous New Bag/Given 09/14/18 2142)  pantoprazole (PROTONIX) injection 40 mg  (has no administration in time range)  ondansetron (ZOFRAN) injection 4 mg (4 mg Intravenous Given 09/14/18 1930)  0.9 %  sodium chloride infusion ( Intravenous New Bag/Given 09/14/18 1932)  pantoprazole (PROTONIX) 80 mg in sodium chloride 0.9 % 100 mL IVPB (0 mg Intravenous Stopped 09/14/18 2142)  iohexol (OMNIPAQUE) 300 MG/ML solution 75 mL (75 mLs Intravenous Contrast Given 09/14/18 1957)    Mobility walks Low fall risk   Focused Assessments GI   R Recommendations: See Admitting Provider Note  Report given to:   Additional Notes:

## 2018-09-14 NOTE — ED Triage Notes (Addendum)
Pt arrives [facility] from home for rectal bleeding. Started having bloody BM's today at 11am. Hx of same x 4. Pt states feeling dizzy/weak. No blood thinner use. Denies recent falls. A&O upon arrival. Wanted to ambulate from EMS stretcher to ED stretcher.   CBG 296, 99/45, wears 3 L [place] chronically. HR 103.

## 2018-09-14 NOTE — ED Notes (Signed)
Pt to CT at this time.

## 2018-09-15 LAB — BASIC METABOLIC PANEL
Anion gap: 11 (ref 5–15)
BUN: 65 mg/dL — ABNORMAL HIGH (ref 8–23)
CO2: 30 mmol/L (ref 22–32)
Calcium: 8.5 mg/dL — ABNORMAL LOW (ref 8.9–10.3)
Chloride: 97 mmol/L — ABNORMAL LOW (ref 98–111)
Creatinine, Ser: 1.17 mg/dL — ABNORMAL HIGH (ref 0.44–1.00)
GFR calc Af Amer: 53 mL/min — ABNORMAL LOW (ref >60)
GFR calc non Af Amer: 46 mL/min — ABNORMAL LOW (ref >60)
Glucose, Bld: 121 mg/dL — ABNORMAL HIGH (ref 70–99)
Potassium: 3.4 mmol/L — ABNORMAL LOW (ref 3.5–5.1)
Sodium: 138 mmol/L (ref 135–145)

## 2018-09-15 LAB — CBC
HCT: 25.3 % — ABNORMAL LOW (ref 36.0–46.0)
Hemoglobin: 7.9 g/dL — ABNORMAL LOW (ref 12.0–15.0)
MCH: 28.1 pg (ref 26.0–34.0)
MCHC: 31.2 g/dL (ref 30.0–36.0)
MCV: 90 fL (ref 80.0–100.0)
Platelets: 221 10*3/uL (ref 150–400)
RBC: 2.81 MIL/uL — ABNORMAL LOW (ref 3.87–5.11)
RDW: 17.1 % — ABNORMAL HIGH (ref 11.5–15.5)
WBC: 13.7 10*3/uL — ABNORMAL HIGH (ref 4.0–10.5)
nRBC: 0 % (ref 0.0–0.2)

## 2018-09-15 LAB — GLUCOSE, CAPILLARY
Glucose-Capillary: 174 mg/dL — ABNORMAL HIGH (ref 70–99)
Glucose-Capillary: 225 mg/dL — ABNORMAL HIGH (ref 70–99)
Glucose-Capillary: 176 mg/dL — ABNORMAL HIGH (ref 70–99)
Glucose-Capillary: 126 mg/dL — ABNORMAL HIGH (ref 70–99)

## 2018-09-15 LAB — HEMOGLOBIN AND HEMATOCRIT, BLOOD
HCT: 24.1 % — ABNORMAL LOW (ref 36.0–46.0)
Hemoglobin: 7.6 g/dL — ABNORMAL LOW (ref 12.0–15.0)

## 2018-09-15 MED ORDER — POTASSIUM CHLORIDE CRYS ER 20 MEQ PO TBCR
40.0000 meq | EXTENDED_RELEASE_TABLET | Freq: Once | ORAL | Status: AC
  Administered 2018-09-15: 40 meq via ORAL
  Filled 2018-09-15: qty 2

## 2018-09-15 MED ORDER — CYCLOBENZAPRINE HCL 10 MG PO TABS
10.0000 mg | ORAL_TABLET | Freq: Three times a day (TID) | ORAL | Status: DC | PRN
  Administered 2018-09-15: 10 mg via ORAL
  Filled 2018-09-15: qty 1

## 2018-09-15 MED ORDER — INSULIN PUMP
Freq: Three times a day (TID) | SUBCUTANEOUS | Status: DC
  Administered 2018-09-15 (×3): via SUBCUTANEOUS
  Administered 2018-09-16: 5 via SUBCUTANEOUS
  Administered 2018-09-16: 17:00:00 via SUBCUTANEOUS
  Administered 2018-09-17 (×2): 3 via SUBCUTANEOUS
  Filled 2018-09-15: qty 1

## 2018-09-15 NOTE — Progress Notes (Signed)
[facility] NAME: Karen [name]    MR#:  [id number]  DATE OF BIRTH:  [birth year]  SUBJECTIVE:  CHIEF COMPLAINT:   Chief Complaint  Patient presents with  . Rectal Bleeding  Patient seen and evaluated today No new episodes of rectal bleed No new episodes of vomiting of blood Generalized weakness  REVIEW OF SYSTEMS:    ROS  CONSTITUTIONAL: No documented fever. Has fatigue, weakness. No weight gain, no weight loss.  EYES: No blurry or double vision.  ENT: No tinnitus. No postnasal drip. No redness of the oropharynx.  RESPIRATORY: No cough, no wheeze, no hemoptysis. No dyspnea.  CARDIOVASCULAR: No chest pain. No orthopnea. No palpitations. No syncope.  GASTROINTESTINAL: No nausea, no vomiting or diarrhea. No abdominal pain. Had melena GENITOURINARY: No dysuria or hematuria.  ENDOCRINE: No polyuria or nocturia. No heat or cold intolerance.  HEMATOLOGY: No anemia. No bruising. No bleeding.  INTEGUMENTARY: No rashes. No lesions.  MUSCULOSKELETAL: No arthritis. No swelling. No gout.  NEUROLOGIC: No numbness, tingling, or ataxia. No seizure-type activity.  PSYCHIATRIC: No anxiety. No insomnia. No ADD.   DRUG ALLERGIES:   Allergies  Allergen Reactions  . Ace Inhibitors Hives  . Gabapentin Hives  . Lisinopril Hives  . Lyrica [Pregabalin] Hives  . Shrimp [Shellfish Allergy] Swelling    Swelling of the lips    VITALS:  Blood pressure (!) 139/57, pulse 86, temperature 98.4 F (36.9 C), temperature source Oral, resp. rate 20, height 5\' 3"  (1.6 m), weight 93.4 kg, SpO2 98 %.  PHYSICAL EXAMINATION:   Physical Exam  GENERAL:  73 y.o.-year-old patient lying in the bed with no acute distress.  EYES: Pupils equal, round, reactive to light and accommodation. No scleral icterus. Extraocular muscles intact.  HEENT: Head atraumatic, normocephalic. Oropharynx and nasopharynx clear.  Pallor present. NECK:  Supple, no jugular venous  distention. No thyroid enlargement, no tenderness.  LUNGS: Normal breath sounds bilaterally, no wheezing, rales, rhonchi. No use of accessory muscles of respiration.  CARDIOVASCULAR: S1, S2 normal. No murmurs, rubs, or gallops.  ABDOMEN: Soft, nontender, nondistended. Bowel sounds present. No organomegaly or mass.  EXTREMITIES: No cyanosis, clubbing or edema b/l.    NEUROLOGIC: Cranial nerves II through XII are intact. No focal Motor or sensory deficits b/l.   PSYCHIATRIC: The patient is alert and oriented x 3.  SKIN: No obvious rash, lesion, or ulcer.   LABORATORY PANEL:   CBC Recent Labs  Lab 09/15/18 0513  WBC 13.7*  HGB 7.9*  HCT 25.3*  PLT 221   ------------------------------------------------------------------------------------------------------------------ Chemistries  Recent Labs  Lab 09/14/18 1901 09/15/18 0513  NA 139 138  K 4.4 3.4*  CL 94* 97*  CO2 30 30  GLUCOSE 292* 121*  BUN 66* 65*  CREATININE 1.49* 1.17*  CALCIUM 9.3 8.5*  AST 20  --   ALT 13  --   ALKPHOS 52  --   BILITOT 0.6  --    ------------------------------------------------------------------------------------------------------------------  Cardiac Enzymes No results for input(s): TROPONINI in the last 168 hours. ------------------------------------------------------------------------------------------------------------------  RADIOLOGY:  Ct Abdomen Pelvis W Contrast  Result Date: 09/14/2018 CLINICAL DATA:  Rectal bleeding. EXAM: CT ABDOMEN AND PELVIS WITH CONTRAST TECHNIQUE: Multidetector CT imaging of the abdomen and pelvis was performed using the standard protocol following bolus administration of intravenous contrast. CONTRAST:  72mL OMNIPAQUE IOHEXOL 300 MG/ML  SOLN COMPARISON:  September 15, 2017 FINDINGS: Lower chest: No acute abnormality. Hepatobiliary: No focal liver abnormality is seen. No gallstones,  gallbladder wall thickening, or biliary dilatation. Pancreas: Unremarkable. No pancreatic  ductal dilatation or surrounding inflammatory changes. Spleen: Normal in size without focal abnormality. Adrenals/Urinary Tract: The adrenal glands, kidneys, and ureters are normal. There is a small amount of air in the anterior bladder. The bladder is otherwise normal. Stomach/Bowel: Stomach and small bowel are normal. There are colonic diverticuli seen diffusely throughout the colon. The diverticuli are most numerous in the sigmoid colon. No focal diverticulitis is identified. The appendix is not seen but there is no secondary evidence of appendicitis. By report, the patient is status post appendectomy. Vascular/Lymphatic: Atherosclerotic changes are seen in the nonaneurysmal aorta. No adenopathy. Reproductive: Uterus and bilateral adnexa are unremarkable. Other: No free air or free fluid. Musculoskeletal: No acute or significant osseous findings. IMPRESSION: 1. Diverticulosis throughout the colon but most numerous in the sigmoid colon. No focal diverticulitis. 2. There is a small amount of air in the bladder. This may be due to recent catheterization. Recommend clinical correlation. 3. Atherosclerotic changes in the nonaneurysmal aorta. Electronically Signed   By: Dorise Bullion III M.D   On: 09/14/2018 20:42     ASSESSMENT AND PLAN:  73 year old female patient with history of COPD, type 2 diabetes mellitus, hypertension, hyperlipidemia, CKD stage III currently under hospitalist service  -Acute gastrointestinal bleeding Status post gastroenterology evaluation Continue proton pump inhibitor therapy Monitor hemoglobin hematocrit Endoscopy in a.m.  -Anemia Secondary to acute blood loss If hemoglobin drops less than 7 transfuse PRBC IV Serial hemoglobin hematocrit monitoring  -Type 2 diabetes mellitus Sliding scale coverage with insulin Metformin on hold  -COPD Continue home dose inhalers  All the records are reviewed and case discussed with Care Management/Social Worker. Management plans  discussed with the patient, family and they are in agreement.  CODE STATUS: DNR  DVT Prophylaxis: SCDs  TOTAL TIME TAKING CARE OF THIS PATIENT: 37 minutes.   POSSIBLE D/C IN 2 to 3 DAYS, DEPENDING ON CLINICAL CONDITION.  Saundra Shelling M.D on 09/15/2018 at 2:33 PM  Between 7am to 6pm - Pager - [phone]  After 6pm go to [email or web address] - password [email or web address] [facility]  Office  [phone]  CC: Primary care physician; White, Orlene Och, NP  Note: This dictation was prepared with Dragon dictation along with smaller phrase technology. Any transcriptional errors that result from this process are unintentional.

## 2018-09-15 NOTE — Progress Notes (Signed)
Dr Jannifer Franklin notified of patients leg cramps, orders given to change frequency of flexeril

## 2018-09-15 NOTE — Consult Note (Signed)
Jonathon Bellows , MD [address], [place], [place], [place], [address] [address], [place], [place], [place], [address] Phone: [phone]  Fax: [phone]  Consultation  Referring Provider:     Dr Verdell Carmine  Primary Care Physician:  Ricardo Jericho, NP Primary Gastroenterologist:  Dr. Allen Norris          Reason for Consultation:     GI bleed   Date of Admission:  09/14/2018 Date of Consultation:  09/15/2018         HPI:   TYNEKA SCAFIDI is a 73 y.o. female has a history of COPD, diabetes, hypertension, hyperlipidemia and CKD admitted to the hospital with rectal bleeding and some coffee-ground emesis in the ER.  CT abdomen showed diverticulosis throughout the colon but most numerous in the sigmoid colon.  Last colonoscopy in June 2019 by Dr. Allen Norris and was found to have diverticulosis throughout the colon.  At that point of time she was admitted with rectal bleeding which appeared to be diverticular in nature.  CT angiogram of the abdomen last year was also negative.  She says yesterday at the ER had a large bout of coffee ground emesis, rectal bleeding. None today , denies any abdominal pain, denies any NSAID use. Feels well right now. When I went into see her she had just had her lunch .   Past Medical History:  Diagnosis Date  . (HFpEF) heart failure with preserved ejection fraction ([place]) 2017   (1) TTE 2017 a. EF 50% b. mild LVH c. mild MR/TR, d. mild LAE e. mild pulmonary HTN  (2) TTE 06/2018 a. EF 50-55%, mild AS with thickening of valve   . Acute on chronic respiratory failure with hypoxia and hypercapnia ([place]) 01/07/2015  . Anemia   . 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 Asthma   . Cataract   . Chronic kidney disease 08/10/2017  . CKD (chronic kidney disease)   . COPD (chronic obstructive pulmonary disease) ([place])    (1) 06/2018 tobacco use, home 3L oxygen   . Diabetes mellitus without complication ([facility])    (1) A1C 7.7 (06/2018)  . Edema, peripheral 04/20/2014  . History of kidney stones    . Hyperlipidemia   . Hypertension   . Iron deficiency anemia 06/22/2014  . Leucocytosis 10/19/2015  . Overactive bladder   . Primary osteoarthritis of right knee 09/01/2016  . Renal insufficiency   . Sciatica 01/07/2015    Past Surgical History:  Procedure Laterality Date  . APPENDECTOMY    . CESAREAN SECTION     x3  . CHOLECYSTECTOMY    . COLONOSCOPY WITH PROPOFOL N/A 08/28/2017   Procedure: COLONOSCOPY WITH PROPOFOL;  Surgeon: Lucilla Lame, MD;  Location: [facility] ENDOSCOPY;  Service: Endoscopy;  Laterality: N/A;  . COLONOSCOPY WITH PROPOFOL N/A 08/29/2017   Procedure: COLONOSCOPY WITH PROPOFOL;  Surgeon: Lucilla Lame, MD;  Location: [facility] ENDOSCOPY;  Service: Endoscopy;  Laterality: N/A;  . CYSTOSCOPY W/ URETERAL STENT PLACEMENT Right 09/15/2017   Procedure: CYSTOSCOPY WITH RETROGRADE PYELOGRAM/URETERAL STENT PLACEMENT;  Surgeon: Cleon Gustin, MD;  Location: [facility] ORS;  Service: Urology;  Laterality: Right;  . CYSTOSCOPY/URETEROSCOPY/HOLMIUM LASER/STENT PLACEMENT Right 10/09/2017   Procedure: CYSTOSCOPY/URETEROSCOPY/HOLMIUM LASER/STENT PLACEMENT;  Surgeon: Abbie Sons, MD;  Location: [facility] ORS;  Service: Urology;  Laterality: Right;  right Stent exchange  . EYE SURGERY      Prior to Admission medications   Medication Sig Start Date End Date Taking? Authorizing Provider  aspirin 81 MG chewable tablet Chew 81 mg  by mouth daily.    Yes [provider]  atorvastatin (LIPITOR) 10 MG tablet Take 1 tablet by mouth daily.   Yes [provider]  canagliflozin (INVOKANA) 100 MG TABS tablet Take 1 tablet by mouth daily.   Yes [provider]  cyclobenzaprine (FLEXERIL) 10 MG tablet Take 1 tablet (10 mg total) by mouth 3 (three) times daily as needed for muscle spasms. 08/02/18  Yes Norval Gable, MD  Ferrous Sulfate (IRON) 325 (65 Fe) MG TABS Take 1 tablet by mouth daily. 09/14/17  Yes [provider]  fluticasone furoate-vilanterol (BREO ELLIPTA) 100-25  MCG/INH AEPB Inhale 1 puff into the lungs daily.   Yes [provider]  Fluticasone-Umeclidin-Vilant 100-62.5-25 MCG/INH AEPB Inhale 1 puff into the lungs daily. 12/25/17  Yes [provider]  furosemide (LASIX) 40 MG tablet Take 1 tablet (40 mg total) by mouth 2 (two) times daily. 08/29/18  Yes Gollan, Kathlene November, MD  Insulin Human (INSULIN PUMP) SOLN Inject 1 each into the skin 3 times daily with meals, bedtime and 2 AM. Patient taking differently: Inject 1 each into the skin 3 times daily with meals, bedtime and 2 AM. Humalog Insulin 76 units daily 09/18/17  Yes Dustin Flock, MD  losartan (COZAAR) 100 MG tablet Take 100 mg by mouth daily. 09/12/17  Yes [provider]  metFORMIN (GLUCOPHAGE) 500 MG tablet Take 1,000 mg by mouth 2 (two) times daily with a meal.    Yes [provider]  montelukast (SINGULAIR) 10 MG tablet Take 10 mg by mouth at bedtime.   Yes [provider]  solifenacin (VESICARE) 10 MG tablet Take 1 tablet (10 mg total) by mouth daily. 07/23/18  Yes Stoioff, Ronda Fairly, MD  vitamin B-12 (CYANOCOBALAMIN) 500 MCG tablet Take 500 mcg by mouth daily.   Yes [provider]    Family History  Problem Relation Age of Onset  . Other Mother        unknown medical history  . Other Father        unknown medical history     Social History   Tobacco Use  . Smoking status: Former Smoker    Packs/day: 1.00    Years: 20.00    Pack years: 20.00    Quit date: 12/04/1992    Years since quitting: 25.7  . Smokeless tobacco: Never Used  Substance Use Topics  . Alcohol use: No  . Drug use: No    Allergies as of 09/14/2018 - Review Complete 09/14/2018  Allergen Reaction Noted  . Ace inhibitors Hives 01/07/2015  . Gabapentin Hives 01/07/2015  . Lisinopril Hives 01/07/2015  . Lyrica [pregabalin] Hives 08/17/2015  . Shrimp [shellfish allergy] Swelling 10/04/2017    Review of Systems:    All systems reviewed and negative except  where noted in HPI.   Physical Exam:  Vital signs in last 24 hours: Temp:  [98.1 F (36.7 C)-98.4 F (36.9 C)] 98.1 F (36.7 C) (07/12 0540) Pulse Rate:  [91-110] 91 (07/12 0540) Resp:  [20-22] 20 (07/12 0540) BP: (129-151)/(54-76) 129/54 (07/12 0540) SpO2:  [96 %-99 %] 97 % (07/12 0540) Weight:  [93.4 kg] 93.4 kg (07/11 1856) Last BM Date: 09/14/18 General:   Pleasant, cooperative in NAD Head:  Normocephalic and atraumatic. Eyes:   No icterus.   Conjunctiva pink. PERRLA. Ears:  Normal auditory acuity. Neck:  Supple; no masses or thyroidomegaly Lungs: Respirations even and unlabored. Lungs clear to auscultation bilaterally.   No wheezes, crackles, or rhonchi.  Heart:  Regular rate and rhythm;  Without murmur, clicks, rubs or gallops Abdomen:  Soft, nondistended, nontender. Normal bowel sounds. No appreciable masses or hepatomegaly.  No rebound or guarding.  Neurologic:  Alert and oriented x3;  grossly normal neurologically. Skin:  Intact without significant lesions or rashes. Cervical Nodes:  No significant cervical adenopathy. Psych:  Alert and cooperative. Normal affect.  LAB RESULTS: Recent Labs    09/14/18 1901 09/15/18 0513  WBC 17.0* 13.7*  HGB 9.6* 7.9*  HCT 29.5* 25.3*  PLT 266 221   BMET Recent Labs    09/14/18 1901 09/15/18 0513  NA 139 138  K 4.4 3.4*  CL 94* 97*  CO2 30 30  GLUCOSE 292* 121*  BUN 66* 65*  CREATININE 1.49* 1.17*  CALCIUM 9.3 8.5*   LFT Recent Labs    09/14/18 1901  PROT 6.6  ALBUMIN 3.5  AST 20  ALT 13  ALKPHOS 52  BILITOT 0.6   PT/INR Recent Labs    09/14/18 1901  LABPROT 13.3  INR 1.0    STUDIES: Ct Abdomen Pelvis W Contrast  Result Date: 09/14/2018 CLINICAL DATA:  Rectal bleeding. EXAM: CT ABDOMEN AND PELVIS WITH CONTRAST TECHNIQUE: Multidetector CT imaging of the abdomen and pelvis was performed using the standard protocol following bolus administration of intravenous contrast. CONTRAST:  48mL OMNIPAQUE IOHEXOL  300 MG/ML  SOLN COMPARISON:  September 15, 2017 FINDINGS: Lower chest: No acute abnormality. Hepatobiliary: No focal liver abnormality is seen. No gallstones, gallbladder wall thickening, or biliary dilatation. Pancreas: Unremarkable. No pancreatic ductal dilatation or surrounding inflammatory changes. Spleen: Normal in size without focal abnormality. Adrenals/Urinary Tract: The adrenal glands, kidneys, and ureters are normal. There is a small amount of air in the anterior bladder. The bladder is otherwise normal. Stomach/Bowel: Stomach and small bowel are normal. There are colonic diverticuli seen diffusely throughout the colon. The diverticuli are most numerous in the sigmoid colon. No focal diverticulitis is identified. The appendix is not seen but there is no secondary evidence of appendicitis. By report, the patient is status post appendectomy. Vascular/Lymphatic: Atherosclerotic changes are seen in the nonaneurysmal aorta. No adenopathy. Reproductive: Uterus and bilateral adnexa are unremarkable. Other: No free air or free fluid. Musculoskeletal: No acute or significant osseous findings. IMPRESSION: 1. Diverticulosis throughout the colon but most numerous in the sigmoid colon. No focal diverticulitis. 2. There is a small amount of air in the bladder. This may be due to recent catheterization. Recommend clinical correlation. 3. Atherosclerotic changes in the nonaneurysmal aorta. Electronically Signed   By: Dorise Bullion III M.D   On: 09/14/2018 20:42      Impression / Plan:   MANUELITA MOXON is a 73 y.o. y/o female who has a history of a GI bleed about a year back presumed to be diverticular in nature.  Known to have pancolonic diverticulosis admitted to the hospital with rectal bleeding.  Some concern for hematemesis, AKI.  No significant elevation in the BUN and creatinine ratio.  Likely drop in hemoglobin is from a repeat diverticular bleed. Plan 1.  She has never had an EGD so we will plan to perform  an EGD tomorrow. 2.  PPI 3.  Monitor CBC and transfuse as needed.  I have discussed alternative options, risks & benefits,  which include, but are not limited to, bleeding, infection, perforation,respiratory complication & drug reaction.  The patient agrees with this plan & written consent will be obtained.     Thank you for involving me  in the care of this patient.      LOS: 1 day   Jonathon Bellows, MD  09/15/2018, 9:53 AM

## 2018-09-16 ENCOUNTER — Encounter
Admission: EM | Disposition: A | Discharge status: Home / Self Care | Payer: Self-pay | Source: Home / Self Care | Attending: Internal Medicine

## 2018-09-16 ENCOUNTER — Encounter: Payer: Self-pay | Admitting: Anesthesiology

## 2018-09-16 ENCOUNTER — Inpatient Hospital Stay: Payer: Medicare Other | Admitting: Certified Registered"

## 2018-09-16 DIAGNOSIS — K259 Gastric ulcer, unspecified as acute or chronic, without hemorrhage or perforation: Secondary | ICD-10-CM

## 2018-09-16 DIAGNOSIS — K921 Melena: Secondary | ICD-10-CM

## 2018-09-16 DIAGNOSIS — K254 Chronic or unspecified gastric ulcer with hemorrhage: Secondary | ICD-10-CM

## 2018-09-16 DIAGNOSIS — D62 Acute posthemorrhagic anemia: Secondary | ICD-10-CM

## 2018-09-16 DIAGNOSIS — K31819 Angiodysplasia of stomach and duodenum without bleeding: Secondary | ICD-10-CM

## 2018-09-16 DIAGNOSIS — K5520 Angiodysplasia of colon without hemorrhage: Secondary | ICD-10-CM

## 2018-09-16 DIAGNOSIS — K920 Hematemesis: Secondary | ICD-10-CM

## 2018-09-16 DIAGNOSIS — K250 Acute gastric ulcer with hemorrhage: Secondary | ICD-10-CM

## 2018-09-16 HISTORY — PX: ESOPHAGOGASTRODUODENOSCOPY (EGD) WITH PROPOFOL: SHX5813

## 2018-09-16 LAB — IRON AND TIBC
Iron: 56 ug/dL (ref 28–170)
Saturation Ratios: 20 % (ref 10.4–31.8)
TIBC: 277 ug/dL (ref 250–450)
UIBC: 222 ug/dL

## 2018-09-16 LAB — CBC
HCT: 23.9 % — ABNORMAL LOW (ref 36.0–46.0)
Hemoglobin: 7.4 g/dL — ABNORMAL LOW (ref 12.0–15.0)
MCH: 27.9 pg (ref 26.0–34.0)
MCHC: 31 g/dL (ref 30.0–36.0)
MCV: 90.2 fL (ref 80.0–100.0)
Platelets: 190 10*3/uL (ref 150–400)
RBC: 2.65 MIL/uL — ABNORMAL LOW (ref 3.87–5.11)
RDW: 17.2 % — ABNORMAL HIGH (ref 11.5–15.5)
WBC: 9.9 10*3/uL (ref 4.0–10.5)
nRBC: 0 % (ref 0.0–0.2)

## 2018-09-16 LAB — BASIC METABOLIC PANEL
Anion gap: 4 — ABNORMAL LOW (ref 5–15)
BUN: 29 mg/dL — ABNORMAL HIGH (ref 8–23)
CO2: 32 mmol/L (ref 22–32)
Calcium: 8.7 mg/dL — ABNORMAL LOW (ref 8.9–10.3)
Chloride: 107 mmol/L (ref 98–111)
Creatinine, Ser: 0.66 mg/dL (ref 0.44–1.00)
GFR calc Af Amer: >60 mL/min (ref >60)
GFR calc non Af Amer: >60 mL/min (ref >60)
Glucose, Bld: 119 mg/dL — ABNORMAL HIGH (ref 70–99)
Potassium: 3.6 mmol/L (ref 3.5–5.1)
Sodium: 143 mmol/L (ref 135–145)

## 2018-09-16 LAB — FERRITIN: Ferritin: 51 ng/mL (ref 11–307)

## 2018-09-16 LAB — VITAMIN B12: Vitamin B-12: 528 pg/mL (ref 180–914)

## 2018-09-16 LAB — GLUCOSE, CAPILLARY: Glucose-Capillary: 142 mg/dL — ABNORMAL HIGH (ref 70–99)

## 2018-09-16 LAB — FOLATE: Folate: 9.2 ng/mL (ref >5.9)

## 2018-09-16 SURGERY — ESOPHAGOGASTRODUODENOSCOPY (EGD) WITH PROPOFOL
Anesthesia: General

## 2018-09-16 MED ORDER — SODIUM CHLORIDE 0.9 % IV SOLN: INTRAVENOUS | Status: DC

## 2018-09-16 MED ORDER — SODIUM CHLORIDE 0.9 % IV SOLN
510.0000 mg | Freq: Once | INTRAVENOUS | Status: AC
  Administered 2018-09-16: 16:00:00 510 mg via INTRAVENOUS
  Filled 2018-09-16: qty 17

## 2018-09-16 MED ORDER — SODIUM CHLORIDE 0.9 % IV SOLN
300.0000 mg | Freq: Once | INTRAVENOUS | Status: AC
  Administered 2018-09-17: 300 mg via INTRAVENOUS
  Filled 2018-09-16: qty 15

## 2018-09-16 MED ORDER — PANTOPRAZOLE SODIUM 40 MG PO TBEC
40.0000 mg | DELAYED_RELEASE_TABLET | Freq: Two times a day (BID) | ORAL | Status: DC
  Administered 2018-09-16 – 2018-09-17 (×2): 40 mg via ORAL
  Filled 2018-09-16 (×2): qty 1

## 2018-09-16 MED ORDER — PROPOFOL 10 MG/ML IV BOLUS
INTRAVENOUS | Status: DC | PRN
  Administered 2018-09-16 (×6): 20 mg via INTRAVENOUS

## 2018-09-16 NOTE — Progress Notes (Signed)
[facility] NAME: Jaiyla Granados    MR#:  [id number]  DATE OF BIRTH:  12-01-1945  SUBJECTIVE:  CHIEF COMPLAINT:   Chief Complaint  Patient presents with  . Rectal Bleeding  Patient seen and evaluated today No new episodes of rectal bleed No new episodes of vomiting of blood Generalized weakness Comfortable on oxygen via nasal cannula at 2 L  REVIEW OF SYSTEMS:    ROS  CONSTITUTIONAL: No documented fever. Has fatigue, weakness. No weight gain, no weight loss.  EYES: No blurry or double vision.  ENT: No tinnitus. No postnasal drip. No redness of the oropharynx.  RESPIRATORY: No cough, no wheeze, no hemoptysis. No dyspnea.  CARDIOVASCULAR: No chest pain. No orthopnea. No palpitations. No syncope.  GASTROINTESTINAL: No nausea, no vomiting or diarrhea. No abdominal pain. Had melena GENITOURINARY: No dysuria or hematuria.  ENDOCRINE: No polyuria or nocturia. No heat or cold intolerance.  HEMATOLOGY: No anemia. No bruising. No bleeding.  INTEGUMENTARY: No rashes. No lesions.  MUSCULOSKELETAL: No arthritis. No swelling. No gout.  NEUROLOGIC: No numbness, tingling, or ataxia. No seizure-type activity.  PSYCHIATRIC: No anxiety. No insomnia. No ADD.   DRUG ALLERGIES:   Allergies  Allergen Reactions  . Ace Inhibitors Hives  . Gabapentin Hives  . Lisinopril Hives  . Lyrica [Pregabalin] Hives  . Shrimp [Shellfish Allergy] Swelling    Swelling of the lips    VITALS:  Blood pressure (!) 144/69, pulse 87, temperature 98.5 F (36.9 C), temperature source Oral, resp. rate 16, height 5\' 3"  (1.6 m), weight 93.4 kg, SpO2 100 %.  PHYSICAL EXAMINATION:   Physical Exam  GENERAL:  73 y.o.-year-old patient lying in the bed with no acute distress.  EYES: Pupils equal, round, reactive to light and accommodation. No scleral icterus. Extraocular muscles intact.  HEENT: Head atraumatic, normocephalic. Oropharynx and nasopharynx clear.   Pallor present. NECK:  Supple, no jugular venous distention. No thyroid enlargement, no tenderness.  LUNGS: Normal breath sounds bilaterally, no wheezing, rales, rhonchi. No use of accessory muscles of respiration.  CARDIOVASCULAR: S1, S2 normal. No murmurs, rubs, or gallops.  ABDOMEN: Soft, nontender, nondistended. Bowel sounds present. No organomegaly or mass.  EXTREMITIES: No cyanosis, clubbing or edema b/l.    NEUROLOGIC: Cranial nerves II through XII are intact. No focal Motor or sensory deficits b/l.   PSYCHIATRIC: The patient is alert and oriented x 3.  SKIN: No obvious rash, lesion, or ulcer.   LABORATORY PANEL:   CBC Recent Labs  Lab 09/16/18 0528  WBC 9.9  HGB 7.4*  HCT 23.9*  PLT 190   ------------------------------------------------------------------------------------------------------------------ Chemistries  Recent Labs  Lab 09/14/18 1901  09/16/18 0528  NA 139   < > 143  K 4.4   < > 3.6  CL 94*   < > 107  CO2 30   < > 32  GLUCOSE 292*   < > 119*  BUN 66*   < > 29*  CREATININE 1.49*   < > 0.66  CALCIUM 9.3   < > 8.7*  AST 20  --   --   ALT 13  --   --   ALKPHOS 52  --   --   BILITOT 0.6  --   --    < > = values in this interval not displayed.   ------------------------------------------------------------------------------------------------------------------  Cardiac Enzymes No results for input(s): TROPONINI in the last 168 hours. ------------------------------------------------------------------------------------------------------------------  RADIOLOGY:  Ct Abdomen Pelvis W Contrast  Result Date: 09/14/2018 CLINICAL DATA:  Rectal bleeding. EXAM: CT ABDOMEN AND PELVIS WITH CONTRAST TECHNIQUE: Multidetector CT imaging of the abdomen and pelvis was performed using the standard protocol following bolus administration of intravenous contrast. CONTRAST:  73mL OMNIPAQUE IOHEXOL 300 MG/ML  SOLN COMPARISON:  September 15, 2017 FINDINGS: Lower chest: No acute  abnormality. Hepatobiliary: No focal liver abnormality is seen. No gallstones, gallbladder wall thickening, or biliary dilatation. Pancreas: Unremarkable. No pancreatic ductal dilatation or surrounding inflammatory changes. Spleen: Normal in size without focal abnormality. Adrenals/Urinary Tract: The adrenal glands, kidneys, and ureters are normal. There is a small amount of air in the anterior bladder. The bladder is otherwise normal. Stomach/Bowel: Stomach and small bowel are normal. There are colonic diverticuli seen diffusely throughout the colon. The diverticuli are most numerous in the sigmoid colon. No focal diverticulitis is identified. The appendix is not seen but there is no secondary evidence of appendicitis. By report, the patient is status post appendectomy. Vascular/Lymphatic: Atherosclerotic changes are seen in the nonaneurysmal aorta. No adenopathy. Reproductive: Uterus and bilateral adnexa are unremarkable. Other: No free air or free fluid. Musculoskeletal: No acute or significant osseous findings. IMPRESSION: 1. Diverticulosis throughout the colon but most numerous in the sigmoid colon. No focal diverticulitis. 2. There is a small amount of air in the bladder. This may be due to recent catheterization. Recommend clinical correlation. 3. Atherosclerotic changes in the nonaneurysmal aorta. Electronically Signed   By: Dorise Bullion III M.D   On: 09/14/2018 20:42     ASSESSMENT AND PLAN:  73 year old female patient with history of COPD, type 2 diabetes mellitus, hypertension, hyperlipidemia, CKD stage III currently under hospitalist service  -Acute gastrointestinal bleeding Status post gastroenterology evaluation Status post EGD  Nonbleeding gastric ulcer noted Nonbleeding angiectasia in the duodenum which was treated with argon plasma coagulation Continue proton pump inhibitor therapy orally Monitor hemoglobin hematocrit Resume diet  Anemia Secondary to acute blood loss If  hemoglobin drops less than 7 transfuse PRBC IV Serial hemoglobin hematocrit monitoring  -Type 2 diabetes mellitus Sliding scale coverage with insulin Metformin on hold  -COPD Continue home dose inhalers  All the records are reviewed and case discussed with Care Management/Social Worker. Management plans discussed with the patient, family and they are in agreement.  CODE STATUS: DNR  DVT Prophylaxis: SCDs  TOTAL TIME TAKING CARE OF THIS PATIENT: 35 minutes.   POSSIBLE D/C IN 2 to 3 DAYS, DEPENDING ON CLINICAL CONDITION.  Saundra Shelling M.D on 09/16/2018 at 3:42 PM  Between 7am to 6pm - Pager - [phone]  After 6pm go to [email or web address] - password [email or web address] [facility]  Office  [phone]  CC: Primary care physician; White, Orlene Och, NP  Note: This dictation was prepared with Dragon dictation along with smaller phrase technology. Any transcriptional errors that result from this process are unintentional.

## 2018-09-16 NOTE — Anesthesia Post-op Follow-up Note (Signed)
Anesthesia QCDR form completed.        

## 2018-09-16 NOTE — Transfer of Care (Signed)
Immediate Anesthesia Transfer of Care Note  Patient: Karen [name]  Procedure(s) Performed: ESOPHAGOGASTRODUODENOSCOPY (EGD) WITH PROPOFOL (N/A )  Patient Location: Endoscopy Unit  Anesthesia Type:General  Level of Consciousness: awake, alert  and patient cooperative  Airway & Oxygen Therapy: Patient Spontanous Breathing and Patient connected to nasal cannula oxygen  Post-op Assessment: Report given to RN and Post -op Vital signs reviewed and stable  Post vital signs: Reviewed and stable  Last Vitals:  Vitals Value Taken Time  BP 129/63 09/16/18 1219  Temp 36.4 C 09/16/18 1219  Pulse 88 09/16/18 1221  Resp 22 09/16/18 1221  SpO2 97 % 09/16/18 1221  Vitals shown include unvalidated device data.  Last Pain:  Vitals:   09/16/18 1219  TempSrc: Tympanic  PainSc: 0-No pain         Complications: No apparent anesthesia complications

## 2018-09-16 NOTE — Anesthesia Preprocedure Evaluation (Signed)
Anesthesia Evaluation  Patient identified by MRN, date of birth, ID band Patient awake    Reviewed: Allergy & Precautions, H&P , NPO status , Patient's Chart, lab work & pertinent test results, reviewed documented beta blocker date and time   History of Anesthesia Complications Negative for: history of anesthetic complications  Airway Mallampati: III  TM Distance: >3 FB Neck ROM: full    Dental  (+) Edentulous Upper, Dental Advidsory Given, Missing, Poor Dentition, Upper Dentures   Pulmonary shortness of breath, with exertion and Long-Term Oxygen Therapy, neg sleep apnea, COPD,  COPD inhaler and oxygen dependent, neg recent URI, former smoker,           Cardiovascular Exercise Tolerance: Good hypertension, (-) angina+CHF  (-) CAD, (-) Past MI, (-) Cardiac Stents and (-) CABG (-) dysrhythmias (-) Valvular Problems/Murmurs     Neuro/Psych negative neurological ROS  negative psych ROS   GI/Hepatic negative GI ROS, Neg liver ROS,   Endo/Other  diabetes, Insulin Dependent, Oral Hypoglycemic AgentsMorbid obesity  Renal/GU CRFRenal disease  negative genitourinary   Musculoskeletal   Abdominal   Peds  Hematology  (+) Blood dyscrasia, anemia ,   Anesthesia Other Findings Past Medical History: 01/07/2015: Acute on chronic respiratory failure with hypoxia and  hypercapnia (HCC) No date: Anemia 01/07/2015: Asterixis No date: Cataract No date: CHF (congestive heart failure) ([place]) 08/10/2017: Chronic kidney disease No date: CKD (chronic kidney disease) No date: COPD (chronic obstructive pulmonary disease) (HCC) No date: Diabetes mellitus without complication ([place]) 8/67/6195: Edema, peripheral No date: Hypertension 06/22/2014: Iron deficiency anemia 10/19/2015: Leucocytosis 09/01/2016: Primary osteoarthritis of right knee No date: Renal insufficiency 01/07/2015: Sciatica   Reproductive/Obstetrics negative OB ROS                              Anesthesia Physical  Anesthesia Plan  ASA: IV  Anesthesia Plan: General   Post-op Pain Management:    Induction: Intravenous  PONV Risk Score and Plan: 3 and Propofol infusion  Airway Management Planned: Nasal Cannula  Additional Equipment:   Intra-op Plan:   Post-operative Plan:   Informed Consent: I have reviewed the patients History and Physical, chart, labs and discussed the procedure including the risks, benefits and alternatives for the proposed anesthesia with the patient or authorized representative who has indicated his/her understanding and acceptance.     Dental Advisory Given  Plan Discussed with: Anesthesiologist, CRNA and Surgeon  Anesthesia Plan Comments:         Anesthesia Quick Evaluation

## 2018-09-16 NOTE — Progress Notes (Signed)
Spoke with dr.patel (shreyang) and notified of pt having her own fs machine and insulin pump. Received ok for her to use her own and we can skip 2am dose as ordered.

## 2018-09-16 NOTE — Op Note (Signed)
[facility] Gastroenterology Patient Name: Karen [name] Procedure Date: 09/16/2018 11:32 AM MRN: [id number] Account #: [id number] Date of Birth: [birth year] Admit Type: Outpatient Age: 73 Room: [facility] ENDO ROOM 4 Gender: Female Note Status: Finalized Procedure:            Upper GI endoscopy Indications:          Acute post hemorrhagic anemia, Coffee-ground emesis,                        Melena Providers:            Lin Landsman MD, MD Medicines:            Monitored Anesthesia Care Complications:        No immediate complications. Estimated blood loss: None. Procedure:            Pre-Anesthesia Assessment:                       - Prior to the procedure, a History and Physical was                        performed, and patient medications and allergies were                        reviewed. The patient is competent. The risks and                        benefits of the procedure and the sedation options and                        risks were discussed with the patient. All questions                        were answered and informed consent was obtained.                        Patient identification and proposed procedure were                        verified by the physician, the nurse, the                        anesthesiologist, the anesthetist and the technician in                        the pre-procedure area in the procedure room in the                        endoscopy suite. Mental Status Examination: alert and                        oriented. Airway Examination: normal oropharyngeal                        airway and neck mobility. Respiratory Examination:                        clear to auscultation. CV Examination: normal.  Prophylactic Antibiotics: The patient does not require                        prophylactic antibiotics. Prior Anticoagulants: The                        patient has taken no previous anticoagulant or           antiplatelet agents. ASA Grade Assessment: IV - A                        patient with severe systemic disease that is a constant                        threat to life. After reviewing the risks and benefits,                        the patient was deemed in satisfactory condition to                        undergo the procedure. The anesthesia plan was to use                        monitored anesthesia care (MAC). Immediately prior to                        administration of medications, the patient was                        re-assessed for adequacy to receive sedatives. The                        heart rate, respiratory rate, oxygen saturations, blood                        pressure, adequacy of pulmonary ventilation, and                        response to care were monitored throughout the                        procedure. The physical status of the patient was                        re-assessed after the procedure.                       After obtaining informed consent, the endoscope was                        passed under direct vision. Throughout the procedure,                        the patient's blood pressure, pulse, and oxygen                        saturations were monitored continuously. The Endoscope                        was introduced through the mouth, and advanced to the  second part of duodenum. The upper GI endoscopy was                        accomplished without difficulty. The patient tolerated                        the procedure fairly well. Findings:      A single 5 mm angioectasia without bleeding was found in the second       portion of the duodenum. Coagulation for hemostasis using argon plasma       was successful.      One non-bleeding cratered gastric ulcer with a nonbleeding visible       vessel (Forrest Class IIa) was found on the greater curvature of the       gastric body. The lesion was 10 mm in largest dimension. Fulguration  to       stop the bleeding by bipolar probe was successful.      Many non-bleeding cratered gastric ulcers with a clean ulcer base       (Forrest Class III) were found at the incisura and in the gastric       antrum. The largest lesion was 5 mm in largest dimension. Biopsies were       taken with a cold forceps for Helicobacter pylori testing.      The cardia and gastric fundus were normal on retroflexion.      Esophagogastric landmarks were identified: the gastroesophageal junction       was found at 40 cm from the incisors.      The gastroesophageal junction and examined esophagus were normal. Impression:           - A single non-bleeding angioectasia in the duodenum.                        Treated with argon plasma coagulation (APC).                       - Non-bleeding gastric ulcer with a nonbleeding visible                        vessel (Forrest Class IIa). Treated with bipolar                        cautery.                       - Non-bleeding gastric ulcers with a clean ulcer base                        (Forrest Class III). Biopsied.                       - Esophagogastric landmarks identified.                       - Normal gastroesophageal junction and esophagus. Recommendation:       - Return patient to hospital ward for ongoing care.                       - Resume previous diet today.                       -  Continue present medications.                       - Use Prilosec (omeprazole) 40 mg PO BID indefinitely.                       - Await pathology results.                       - Return to GI office in 4 weeks.                       - No ibuprofen, naproxen, or other non-steroidal                        anti-inflammatory drugs. Procedure Code(s):    --- Professional ---                       [phone], 8, Esophagogastroduodenoscopy, flexible,                        transoral; with control of bleeding, any method                       43239, Esophagogastroduodenoscopy,  flexible, transoral;                        with biopsy, single or multiple Diagnosis Code(s):    --- Professional ---                       K31.819, Angiodysplasia of stomach and duodenum without                        bleeding                       K25.4, Chronic or unspecified gastric ulcer with                        hemorrhage                       K25.9, Gastric ulcer, unspecified as acute or chronic,                        without hemorrhage or perforation                       D62, Acute posthemorrhagic anemia                       K92.0, Hematemesis                       K92.1, Melena (includes Hematochezia) CPT copyright 2019 American Medical Association. All rights reserved. The codes documented in this report are preliminary and upon coder review may  be revised to meet current compliance requirements. Dr. Ulyess Mort Lin Landsman MD, MD 09/16/2018 12:32:25 PM This report has been signed electronically. Number of Addenda: 0 Note Initiated On: 09/16/2018 11:32 AM Estimated Blood Loss: Estimated blood loss: none.      [facility]

## 2018-09-16 NOTE — TOC Initial Note (Signed)
Transition of Care [facility]) - Initial/Assessment Note    Patient Details  Name: Karen [name] MRN: [id number] Date of Birth: [birth year]  Transition of [facility]) CM/SW Contact:    Beverly Sessions, RN Phone Number: 09/16/2018, 2:51 PM  Clinical Narrative:                  Patient admitted from home with GI bleed Patient lives at home with husband.   Denies issues with transportation or obtaining medications   PCP [place]   Previous admission SNF was recommended and patient opted to return home.  Patient is open with [facility] home health.  Malachy Mood with [facility] notified of admission  Patient has chronic Home O2 with [employer].  Has elevated toilet seat, cane, and RW  Expected Discharge Plan: [place] Barriers to Discharge: Continued Medical Work up   Patient Goals and CMS Choice        Expected Discharge Plan and Services Expected Discharge Plan: [place]       Living arrangements for the past 2 months: Single Family Home                                      Prior Living Arrangements/Services Living arrangements for the past 2 months: Single Family Home Lives with:: Spouse Patient language and need for interpreter reviewed:: Yes Do you feel safe going back to the place where you live?: Yes      Need for Family Participation in Patient Care: Yes (Comment) Care giver support system in place?: Yes (comment) Current home services: Home PT, Home RN Criminal Activity/Legal Involvement Pertinent to Current Situation/Hospitalization: No - Comment as needed  Activities of Daily Living Home Assistive Devices/Equipment: Walker (specify type)(front wheel walker) ADL Screening (condition at time of admission) Patient's cognitive ability adequate to safely complete daily activities?: Yes Is the patient deaf or have difficulty hearing?: No Does the patient have difficulty seeing, even when wearing  glasses/contacts?: No Does the patient have difficulty concentrating, remembering, or making decisions?: No Patient able to express need for assistance with ADLs?: Yes Does the patient have difficulty dressing or bathing?: No Independently performs ADLs?: Yes (appropriate for developmental age) Does the patient have difficulty walking or climbing stairs?: Yes Weakness of Legs: None Weakness of Arms/Hands: None  Permission Sought/Granted                  Emotional Assessment Appearance:: Appears stated age         Psych Involvement: No (comment)  Admission diagnosis:  Acute GI bleeding [K92.2] Patient Active Problem List   Diagnosis Date Noted  . AVM (arteriovenous malformation) of small bowel, acquired   . Acute gastric ulcer with hemorrhage   . Chronic diastolic heart failure ([place]) 08/22/2018  . Diarrhea 08/22/2018  . Junctional bradycardia   . Acute on chronic heart failure with preserved ejection fraction (HFpEF) ([place])   . AKI (acute kidney injury) ([place])   . Symptomatic bradycardia 08/05/2018  . Acute on chronic respiratory failure ([place]) 06/25/2018  . 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 diabetes mellitus ([place]) 01/25/2018  . History of non anemic vitamin B12 deficiency 01/25/2018  . Personal history of kidney stones 11/12/2017  . Urge incontinence 11/12/2017  . Right ureteral stone 09/15/2017  . Acute GI bleeding   . GI bleed 08/26/2017  . Arthritis 08/10/2017  .  Chronic kidney disease 08/10/2017  . COPD (chronic obstructive pulmonary disease) ([place]) 08/10/2017  . Diabetes mellitus type 2, uncomplicated ([place]) 07/86/7544  . Hypertension 08/10/2017  . Obesity (BMI 35.0-39.9 without comorbidity) 04/11/2017  . Primary osteoarthritis of right knee 09/01/2016  . Leucocytosis 10/19/2015  . 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 Acute on chronic respiratory failure with hypoxia and hypercapnia ([place]) 01/07/2015  . Sciatica 01/07/2015  . Weakness 01/07/2015  .  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 Iron deficiency anemia 06/22/2014  . Microalbuminuria 06/22/2014  . CHF (congestive heart failure) ([place]) 04/20/2014  . Edema, peripheral 04/20/2014   PCP:  Ricardo Jericho, NP Pharmacy:   [facility] [address] [place] [address] Phone: [phone] Fax: [phone]     Social Determinants of Health (SDOH) Interventions    Readmission Risk Interventions Readmission Risk Prevention Plan 09/16/2018 09/16/2018 08/10/2018  Transportation Screening Complete - Complete  PCP or Specialist Appt within 5-7 Days - - -  Home Care Screening - - -  Medication Review (RN CM) - - -  Palliative Care Screening - - -  Medication Review ([place]) Complete Complete Complete  PCP or Specialist appointment within 3-5 days of discharge - - Complete  HRI or Home Care Consult Complete Complete Complete  Palliative Care Screening Not Applicable - -  [place] - - Patient Refused  Some recent data might be hidden

## 2018-09-16 NOTE — Anesthesia Postprocedure Evaluation (Signed)
Anesthesia Post Note  Patient: Karen [name]  Procedure(s) Performed: ESOPHAGOGASTRODUODENOSCOPY (EGD) WITH PROPOFOL (N/A )  Patient location during evaluation: Endoscopy Anesthesia Type: General Level of consciousness: awake and alert and oriented Pain management: pain level controlled Vital Signs Assessment: post-procedure vital signs reviewed and stable Respiratory status: spontaneous breathing Cardiovascular status: blood pressure returned to baseline Anesthetic complications: no     Last Vitals:  Vitals:   09/16/18 1052 09/16/18 1219  BP: (!) 144/65 129/63  Pulse: 85 89  Resp: 16 (!) 22  Temp:  36.4 C  SpO2: 96% 97%    Last Pain:  Vitals:   09/16/18 1219  TempSrc: Tympanic  PainSc: 0-No pain                 Latresha Yahr

## 2018-09-16 NOTE — Progress Notes (Addendum)
Inpatient Diabetes Program Recommendations  AACE/ADA: New Consensus Statement on Inpatient Glycemic Control  Target Ranges:  Prepandial:   less than 140 mg/dL      Peak postprandial:   less than 180 mg/dL (1-2 hours)      Critically ill patients:  140 - 180 mg/dL  Results for Karen [name], Karen [name] (MRN [id number]) as of 09/16/2018 09:30  Ref. Range 09/16/2018 05:28  Glucose Latest Ref Range: 70 - 99 mg/dL 119 (H)   Results for Karen [name], Karen [name] (MRN [id number]) as of 09/16/2018 09:30  Ref. Range 09/15/2018 07:43 09/15/2018 11:47 09/15/2018 16:27 09/15/2018 22:49 09/16/2018 02:11  Glucose-Capillary Latest Ref Range: 70 - 99 mg/dL 174 (H) 225 (H) 176 (H) 126 (H) 142 (H)   Review of Glycemic Control  Diabetes history: DM2  Outpatient Diabetes medications: Invokana 100 mg daily, Metformin 1000 mg BID, V-Go 40 insulin pump with Humalog (V-Go 40 delivers 40 units of basal insulin over 24H period, pt uses 2 clicks for small meals, 3 clicks for medium meal, 4 clicks for large meal, extra click if blood sugar over 170 before your meals, take 6 clicks if blood sugar is over 250  Current orders for Inpatient glycemic control: Insulin Pump ACHS&2am   NOTE: Per chart, noted patient is followed by Dr. Lilian Kapur, NP, and Dr. Honor Junes for DM management and was last seen on 09/10/18. Per office note on 09/10/18, patient uses V-Go 40 insulin pump (delivers 40 units of basal insulin over 24 hour period) and takes 3-5 clicks with meals (each click delivers 2 units of insulin), and takes extra clicks if glucose is elevated. Inpatient Diabetes Coordinator saw patient on 08/06/18 during last hospital admission. CBGs trending okay. Will follow while inpatient. No recommendations at this time.  Thanks, Barnie Alderman, RN, MSN, CDE Diabetes Coordinator Inpatient Diabetes Program [phone] (Team Pager from 8am to 5pm)

## 2018-09-17 ENCOUNTER — Encounter: Payer: Self-pay | Admitting: Gastroenterology

## 2018-09-17 DIAGNOSIS — K250 Acute gastric ulcer with hemorrhage: Principal | ICD-10-CM

## 2018-09-17 DIAGNOSIS — K922 Gastrointestinal hemorrhage, unspecified: Secondary | ICD-10-CM

## 2018-09-17 DIAGNOSIS — K5520 Angiodysplasia of colon without hemorrhage: Secondary | ICD-10-CM

## 2018-09-17 LAB — GLUCOSE, CAPILLARY
Glucose-Capillary: 131 mg/dL — ABNORMAL HIGH (ref 70–99)
Glucose-Capillary: 155 mg/dL — ABNORMAL HIGH (ref 70–99)
Glucose-Capillary: 186 mg/dL — ABNORMAL HIGH (ref 70–99)
Glucose-Capillary: 71 mg/dL (ref 70–99)

## 2018-09-17 LAB — HEMOGLOBIN AND HEMATOCRIT, BLOOD
HCT: 23.1 % — ABNORMAL LOW (ref 36.0–46.0)
HCT: 27.5 % — ABNORMAL LOW (ref 36.0–46.0)
Hemoglobin: 7.1 g/dL — ABNORMAL LOW (ref 12.0–15.0)
Hemoglobin: 8.9 g/dL — ABNORMAL LOW (ref 12.0–15.0)

## 2018-09-17 LAB — PREPARE RBC (CROSSMATCH)

## 2018-09-17 LAB — TYPE AND SCREEN
ABO/RH(D): A NEG
Antibody Screen: NEGATIVE

## 2018-09-17 MED ORDER — SODIUM CHLORIDE 0.9% IV SOLUTION
Freq: Once | INTRAVENOUS | Status: AC
  Administered 2018-09-17: 15:00:00 via INTRAVENOUS

## 2018-09-17 NOTE — Progress Notes (Signed)
Contacted spouse to give update.   Fuller Mandril, RN

## 2018-09-17 NOTE — TOC Transition Note (Signed)
Transition of Care [facility]) - CM/SW Discharge Note   Patient Details  Name: Karen [name] MRN: [id number] Date of Birth: [birth year]  [facility]) CM/SW Contact:  Beverly Sessions, RN Phone Number: 09/17/2018, 11:50 AM   Clinical Narrative:    Patient to discharge today.  Malachy Mood with [facility] notified of discharge Resumption orders have been placed for home health  Patient states that her husband or son will be picking her up today, and they are aware to bring her portable tank for transport     Barriers to Discharge: Barriers Resolved   Patient Goals and CMS Choice Patient states their goals for this hospitalization and ongoing recovery are:: " I want to get out at a decent time today"      Discharge Placement                       Discharge Plan and Services                          HH Arranged: RN, PT [facility] Agency: [place] Date [place]: 09/17/18 Time [place]: 1150 Representative spoke with at [place]: [place] ([place]) Interventions     Readmission Risk Interventions Readmission Risk Prevention Plan 09/17/2018 09/16/2018 09/16/2018  Transportation Screening Complete Complete -  PCP or Specialist Appt within 5-7 Days - - -  Home Care Screening - - -  Medication Review (RN CM) - - -  Palliative Care Screening - - -  Medication Review (RN Care Manager) Complete Complete Complete  PCP or Specialist appointment within 3-5 days of discharge Not Complete - -  PCP/Specialist Appt Not Complete comments PCP appointment 7/20 - -  [place] or Home Care Consult Complete Complete Complete  Palliative Care Screening Not Applicable Not Applicable -  [place] Not Applicable - -  Some recent data might be hidden

## 2018-09-17 NOTE — Discharge Summary (Signed)
[facility] NAME: Karen [name]    MR#:  [id number]  DATE OF BIRTH:  1945/10/24  DATE OF ADMISSION:  09/14/2018 ADMITTING PHYSICIAN: Karen Leber, MD  DATE OF DISCHARGE: 09/17/2018  PRIMARY CARE PHYSICIAN: Karen Jericho, NP   ADMISSION DIAGNOSIS:  Acute GI bleeding [K92.2]  DISCHARGE DIAGNOSIS:  Active Problems:   GI bleed   AVM (arteriovenous malformation) of small bowel, acquired   Acute gastric ulcer with hemorrhage Symptomatic anemia Iron deficiency  SECONDARY DIAGNOSIS:   Past Medical History:  Diagnosis Date  . (HFpEF) heart failure with preserved ejection fraction ([facility]) 2017   (1) TTE 2017 a. EF 50% b. mild LVH c. mild MR/TR, d. mild LAE e. mild pulmonary HTN  (2) TTE 06/2018 a. EF 50-55%, mild AS with thickening of valve   . Acute on chronic respiratory failure with hypoxia and hypercapnia ([facility]) 01/07/2015  . Anemia   . 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 Asthma   . Cataract   . Chronic kidney disease 08/10/2017  . CKD (chronic kidney disease)   . COPD (chronic obstructive pulmonary disease) ([facility])    (1) 06/2018 tobacco use, home 3L oxygen   . Diabetes mellitus without complication (HCC)    (1) A1C 7.7 (06/2018)  . Edema, peripheral 04/20/2014  . History of kidney stones   . Hyperlipidemia   . Hypertension   . Iron deficiency anemia 06/22/2014  . Leucocytosis 10/19/2015  . Overactive bladder   . Primary osteoarthritis of right knee 09/01/2016  . Renal insufficiency   . Sciatica 01/07/2015     ADMITTING HISTORY Karen [name]  is a 73 y.o. female with a known history of chronic kidney disease stage III, COPD, diabetes, hypertension, hyperlipidemia, history of overactive bladder, who presents to the hospital due to multiple episodes of rectal bleeding and also having some coffee-ground emesis in the ER.  Patient says she had 4-5 episodes of rectal bleeding at home which was mostly blood and liquid but no clots.  She  came to the ER and had one episode of coffee-ground emesis.  She also complains of abdominal pain which is crampy in nature, but no fever, chills or any other associated symptoms.  Patient CT scan of the abdomen pelvis was positive for just diverticulosis but no other acute pathology.  Patient is being admitted to the hospital for work-up and treatment of her GI bleed. Patient's COVID-19 test is negative.  HOSPITAL COURSE:  Patient admitted to medical floor.  Patient was started on IV Protonix drip and serial hemoglobin hematocrit was monitored aspirin was held.  Patient was transfused 1 unit PRBC during the hospitalization.  She also received IV iron infusions.  She received home dose inhalers for COPD.  Patient was evaluated by gastroenterology and had upper endoscopy.  Endoscopy revealed angiectasia in the duodenum which was treated by argon plasma coagulation.  It also revealed gastric ulcer which was treated by cautery.  Patient continued proton pump inhibitor.  Upon advice from GI patient will be discharged home.  CONSULTS OBTAINED:  Treatment Team:  Jonathon Bellows, MD  DRUG ALLERGIES:   Allergies  Allergen Reactions  . Ace Inhibitors Hives  . Gabapentin Hives  . Lisinopril Hives  . Lyrica [Pregabalin] Hives  . Shrimp [Shellfish Allergy] Swelling    Swelling of the lips    DISCHARGE MEDICATIONS:   Allergies as of 09/17/2018      Reactions   Ace Inhibitors Hives   Gabapentin Hives  Lisinopril Hives   Lyrica [pregabalin] Hives   Shrimp [shellfish Allergy] Swelling   Swelling of the lips      Medication List    STOP taking these medications   aspirin 81 MG chewable tablet     TAKE these medications   atorvastatin 10 MG tablet Commonly known as: LIPITOR Take 1 tablet by mouth daily.   Breo Ellipta 100-25 MCG/INH Aepb Generic drug: fluticasone furoate-vilanterol Inhale 1 puff into the lungs daily.   cyclobenzaprine 10 MG tablet Commonly known as: FLEXERIL Take 1  tablet (10 mg total) by mouth 3 (three) times daily as needed for muscle spasms.   Fluticasone-Umeclidin-Vilant 100-62.5-25 MCG/INH Aepb Inhale 1 puff into the lungs daily.   furosemide 40 MG tablet Commonly known as: LASIX Take 1 tablet (40 mg total) by mouth 2 (two) times daily.   insulin pump Soln Inject 1 each into the skin 3 times daily with meals, bedtime and 2 AM. What changed: additional instructions   Invokana 100 MG Tabs tablet Generic drug: canagliflozin Take 1 tablet by mouth daily.   Iron 325 (65 Fe) MG Tabs Take 1 tablet by mouth daily.   losartan 100 MG tablet Commonly known as: COZAAR Take 100 mg by mouth daily.   metFORMIN 500 MG tablet Commonly known as: GLUCOPHAGE Take 1,000 mg by mouth 2 (two) times daily with a meal.   montelukast 10 MG tablet Commonly known as: SINGULAIR Take 10 mg by mouth at bedtime.   solifenacin 10 MG tablet Commonly known as: VESICARE Take 1 tablet (10 mg total) by mouth daily.   vitamin B-12 500 MCG tablet Commonly known as: CYANOCOBALAMIN Take 500 mcg by mouth daily.       Today  Patient seen today Hemodynamically stable Received PRBC transfusion Will be discharged home VITAL SIGNS:  Blood pressure (!) 147/45, pulse 75, temperature 98.1 F (36.7 C), temperature source Oral, resp. rate 19, height 5\' 3"  (1.6 m), weight 93.4 kg, SpO2 99 %.  I/O:    Intake/Output Summary (Last 24 hours) at 09/17/2018 1516 Last data filed at 09/17/2018 1439 Gross per 24 hour  Intake 3261.54 ml  Output -  Net 3261.54 ml    PHYSICAL EXAMINATION:  Physical Exam  GENERAL:  73 y.o.-year-old patient lying in the bed with no acute distress.  LUNGS: Normal breath sounds bilaterally, no wheezing, rales,rhonchi or crepitation. No use of accessory muscles of respiration.  CARDIOVASCULAR: S1, S2 normal. No murmurs, rubs, or gallops.  ABDOMEN: Soft, non-tender, non-distended. Bowel sounds present. No organomegaly or mass.  NEUROLOGIC:  Moves all 4 extremities. PSYCHIATRIC: The patient is alert and oriented x 3.  SKIN: No obvious rash, lesion, or ulcer.   DATA REVIEW:   CBC Recent Labs  Lab 09/16/18 0528 09/17/18 0249  WBC 9.9  --   HGB 7.4* 7.1*  HCT 23.9* 23.1*  PLT 190  --     Chemistries  Recent Labs  Lab 09/14/18 1901  09/16/18 0528  NA 139   < > 143  K 4.4   < > 3.6  CL 94*   < > 107  CO2 30   < > 32  GLUCOSE 292*   < > 119*  BUN 66*   < > 29*  CREATININE 1.49*   < > 0.66  CALCIUM 9.3   < > 8.7*  AST 20  --   --   ALT 13  --   --   ALKPHOS 52  --   --  BILITOT 0.6  --   --    < > = values in this interval not displayed.    Cardiac Enzymes No results for input(s): TROPONINI in the last 168 hours.  Microbiology Results  Results for orders placed or performed during the hospital encounter of 09/14/18  SARS Coronavirus 2 (CEPHEID - Performed in [place] hospital lab), Hosp Order     Status: None   Collection Time: 09/14/18  7:34 PM   Specimen: Nasopharyngeal Swab  Result Value Ref Range Status   SARS Coronavirus 2 NEGATIVE NEGATIVE Final    Comment: (NOTE) If result is NEGATIVE SARS-CoV-2 target nucleic acids are NOT DETECTED. The SARS-CoV-2 RNA is generally detectable in upper and lower  respiratory specimens during the acute phase of infection. The lowest  concentration of SARS-CoV-2 viral copies this assay can detect is 250  copies / mL. A negative result does not preclude SARS-CoV-2 infection  and should not be used as the sole basis for treatment or other  patient management decisions.  A negative result may occur with  improper specimen collection / handling, submission of specimen other  than nasopharyngeal swab, presence of viral mutation(s) within the  areas targeted by this assay, and inadequate number of viral copies  (<250 copies / mL). A negative result must be combined with clinical  observations, patient history, and epidemiological information. If result is  POSITIVE SARS-CoV-2 target nucleic acids are DETECTED. The SARS-CoV-2 RNA is generally detectable in upper and lower  respiratory specimens dur ing the acute phase of infection.  Positive  results are indicative of active infection with SARS-CoV-2.  Clinical  correlation with patient history and other diagnostic information is  necessary to determine patient infection status.  Positive results do  not rule out bacterial infection or co-infection with other viruses. If result is PRESUMPTIVE POSTIVE SARS-CoV-2 nucleic acids MAY BE PRESENT.   A presumptive positive result was obtained on the submitted specimen  and confirmed on repeat testing.  While 2019 novel coronavirus  (SARS-CoV-2) nucleic acids may be present in the submitted sample  additional confirmatory testing may be necessary for epidemiological  and / or clinical management purposes  to differentiate between  SARS-CoV-2 and other Sarbecovirus currently known to infect humans.  If clinically indicated additional testing with an alternate test  methodology [phone]) is advised. The SARS-CoV-2 RNA is generally  detectable in upper and lower respiratory sp ecimens during the acute  phase of infection. The expected result is Negative. Fact Sheet for Patients:  StrictlyIdeas.no Fact Sheet for Healthcare Providers: BankingDealers.co.za This test is not yet approved or cleared by the [place] FDA and has been authorized for detection and/or diagnosis of SARS-CoV-2 by FDA under an Emergency Use Authorization (EUA).  This EUA will remain in effect (meaning this test can be used) for the duration of the COVID-19 declaration under Section 564(b)(1) of the Act, 21 U.S.C. section 360bbb-3(b)(1), unless the authorization is terminated or revoked sooner. Performed at [facility], [address] [place], [place] [address]     RADIOLOGY:  No results found.  Follow up with  PCP in 1 week.  Management plans discussed with the patient, family and they are in agreement.  CODE STATUS: DNR    Code Status Orders  (From admission, onward)         Start     Ordered   09/14/18 2220  Do not attempt resuscitation (DNR)  Continuous    Question Answer Comment  In the  event of cardiac or respiratory ARREST Do not call a "code blue"   In the event of cardiac or respiratory ARREST Do not perform Intubation, CPR, defibrillation or ACLS   In the event of cardiac or respiratory ARREST Use medication by any route, position, wound care, and other measures to relive pain and suffering. May use oxygen, suction and manual treatment of airway obstruction as needed for comfort.      09/14/18 2220        Code Status History    Date Active Date Inactive Code Status Order ID Comments User Context   08/05/2018 1547 08/10/2018 1949 Full Code [id number]  Gladstone Lighter, MD Inpatient   06/25/2018 1417 06/30/2018 1557 Full Code [id number]  Epifanio Lesches, MD ED   09/15/2017 1747 09/18/2017 2106 DNR [id number]  Hillary Bow, MD ED   08/26/2017 2216 08/30/2017 1604 Full Code [id number]  Nicholes Mango, MD Inpatient   01/08/2015 0307 01/09/2015 1336 Full Code [id number]  Theodoro Grist, MD Inpatient   Advance Care Planning Activity      TOTAL TIME TAKING CARE OF THIS PATIENT ON DAY OF DISCHARGE: more than 34 minutes.   Saundra Shelling M.D on 09/17/2018 at 3:16 PM  Between 7am to 6pm - Pager - [phone]  After 6pm go to [email or web address] - password [email or web address] [facility]  Office  [phone]  CC: Primary care physician; White, Orlene Och, NP  Note: This dictation was prepared with Dragon dictation along with smaller phrase technology. Any transcriptional errors that result from this process are unintentional.

## 2018-09-17 NOTE — Progress Notes (Signed)
Karen Darby, MD [address]  [place]  [place], [place] [address]  Main: [phone]  Fax: [phone] Pager: [phone]   Subjective: Patient underwent EGD yesterday, found to have multiple gastric ulcers.  She reports having one episode of black tarry stool after EGD yesterday.  She denies having any bowel movements today.  Her hemoglobin dropped to 7.1 today.  Patient is currently receiving blood transfusion.  She reports feeling better today.   Objective: Vital signs in last 24 hours: Vitals:   09/17/18 0506 09/17/18 1414 09/17/18 1530 09/17/18 1828  BP: (!) 148/63 (!) 147/45 (!) 134/54 (!) 144/69  Pulse: 90 75 73 79  Resp: 20 19 17 17   Temp: 98.5 F (36.9 C) 98.1 F (36.7 C) 98.2 F (36.8 C) 98 F (36.7 C)  TempSrc: Oral Oral Oral Oral  SpO2: 96% 99% 97% 100%  Weight:      Height:       Weight change:   Intake/Output Summary (Last 24 hours) at 09/17/2018 1838 Last data filed at 09/17/2018 1828 Gross per 24 hour  Intake 3510.67 ml  Output -  Net 3510.67 ml     Exam: Heart:: Regular rate and rhythm, S1S2 present or without murmur or extra heart sounds Lungs: normal and clear to auscultation Abdomen: soft, nontender, normal bowel sounds   Lab Results: CBC Latest Ref Rng & Units 09/17/2018 09/16/2018 09/15/2018  WBC 4.0 - 10.5 K/uL - 9.9 -  Hemoglobin 12.0 - 15.0 g/dL 7.1(L) 7.4(L) 7.6(L)  Hematocrit 36.0 - 46.0 % 23.1(L) 23.9(L) 24.1(L)  Platelets 150 - 400 K/uL - 190 -   CMP Latest Ref Rng & Units 09/16/2018 09/15/2018 09/14/2018  Glucose 70 - 99 mg/dL 119(H) 121(H) 292(H)  BUN 8 - 23 mg/dL 29(H) 65(H) 66(H)  Creatinine 0.44 - 1.00 mg/dL 0.66 1.17(H) 1.49(H)  Sodium 135 - 145 mmol/L 143 138 139  Potassium 3.5 - 5.1 mmol/L 3.6 3.4(L) 4.4  Chloride 98 - 111 mmol/L 107 97(L) 94(L)  CO2 22 - 32 mmol/L 32 30 30  Calcium 8.9 - 10.3 mg/dL 8.7(L) 8.5(L) 9.3  Total Protein 6.5 - 8.1 g/dL - - 6.6  Total Bilirubin 0.3 - 1.2 mg/dL - - 0.6  Alkaline  Phos 38 - 126 U/L - - 52  AST 15 - 41 U/L - - 20  ALT 0 - 44 U/L - - 13    Micro Results: Recent Results (from the past 240 hour(s))  SARS Coronavirus 2 (CEPHEID - Performed in [place] hospital lab), Hosp Order     Status: None   Collection Time: 09/14/18  7:34 PM   Specimen: Nasopharyngeal Swab  Result Value Ref Range Status   SARS Coronavirus 2 NEGATIVE NEGATIVE Final    Comment: (NOTE) If result is NEGATIVE SARS-CoV-2 target nucleic acids are NOT DETECTED. The SARS-CoV-2 RNA is generally detectable in upper and lower  respiratory specimens during the acute phase of infection. The lowest  concentration of SARS-CoV-2 viral copies this assay can detect is 250  copies / mL. A negative result does not preclude SARS-CoV-2 infection  and should not be used as the sole basis for treatment or other  patient management decisions.  A negative result may occur with  improper specimen collection / handling, submission of specimen other  than nasopharyngeal swab, presence of viral mutation(s) within the  areas targeted by this assay, and inadequate number of viral copies  (<250 copies / mL). A negative result must be combined with clinical  observations, patient history, and epidemiological information. If result is POSITIVE SARS-CoV-2 target nucleic acids are DETECTED. The SARS-CoV-2 RNA is generally detectable in upper and lower  respiratory specimens dur ing the acute phase of infection.  Positive  results are indicative of active infection with SARS-CoV-2.  Clinical  correlation with patient history and other diagnostic information is  necessary to determine patient infection status.  Positive results do  not rule out bacterial infection or co-infection with other viruses. If result is PRESUMPTIVE POSTIVE SARS-CoV-2 nucleic acids MAY BE PRESENT.   A presumptive positive result was obtained on the submitted specimen  and confirmed on repeat testing.  While 2019 novel coronavirus   (SARS-CoV-2) nucleic acids may be present in the submitted sample  additional confirmatory testing may be necessary for epidemiological  and / or clinical management purposes  to differentiate between  SARS-CoV-2 and other Sarbecovirus currently known to infect humans.  If clinically indicated additional testing with an alternate test  methodology [phone]) is advised. The SARS-CoV-2 RNA is generally  detectable in upper and lower respiratory sp ecimens during the acute  phase of infection. The expected result is Negative. Fact Sheet for Patients:  StrictlyIdeas.no Fact Sheet for Healthcare Providers: BankingDealers.co.za This test is not yet approved or cleared by the [place] FDA and has been authorized for detection and/or diagnosis of SARS-CoV-2 by FDA under an Emergency Use Authorization (EUA).  This EUA will remain in effect (meaning this test can be used) for the duration of the COVID-19 declaration under Section 564(b)(1) of the Act, 21 U.S.C. section 360bbb-3(b)(1), unless the authorization is terminated or revoked sooner. Performed at [facility], [address] [place], [place] [address]    Studies/Results: No results found. Medications:  I have reviewed the patient's current medications. Prior to Admission:  Medications Prior to Admission  Medication Sig Dispense Refill Last Dose  . aspirin 81 MG chewable tablet Chew 81 mg by mouth daily.    09/14/2018 at Unknown time  . atorvastatin (LIPITOR) 10 MG tablet Take 1 tablet by mouth daily.   09/14/2018 at Unknown time  . canagliflozin (INVOKANA) 100 MG TABS tablet Take 1 tablet by mouth daily.   09/14/2018 at Unknown time  . cyclobenzaprine (FLEXERIL) 10 MG tablet Take 1 tablet (10 mg total) by mouth 3 (three) times daily as needed for muscle spasms. 30 tablet 0 prn at prn  . Ferrous Sulfate (IRON) 325 (65 Fe) MG TABS Take 1 tablet by mouth daily.  11 09/14/2018  at Unknown time  . fluticasone furoate-vilanterol (BREO ELLIPTA) 100-25 MCG/INH AEPB Inhale 1 puff into the lungs daily.   09/14/2018 at Unknown time  . Fluticasone-Umeclidin-Vilant 100-62.5-25 MCG/INH AEPB Inhale 1 puff into the lungs daily.   09/14/2018 at Unknown time  . furosemide (LASIX) 40 MG tablet Take 1 tablet (40 mg total) by mouth 2 (two) times daily. 60 tablet 5 09/14/2018 at Unknown time  . Insulin Human (INSULIN PUMP) SOLN Inject 1 each into the skin 3 times daily with meals, bedtime and 2 AM. (Patient taking differently: Inject 1 each into the skin 3 times daily with meals, bedtime and 2 AM. Humalog Insulin 76 units daily)   09/14/2018 at Unknown time  . losartan (COZAAR) 100 MG tablet Take 100 mg by mouth daily.  11 09/14/2018 at Unknown time  . metFORMIN (GLUCOPHAGE) 500 MG tablet Take 1,000 mg by mouth 2 (two) times daily with a meal.    09/14/2018 at Unknown time  . montelukast (  SINGULAIR) 10 MG tablet Take 10 mg by mouth at bedtime.   09/13/2018 at Unknown time  . solifenacin (VESICARE) 10 MG tablet Take 1 tablet (10 mg total) by mouth daily. 30 tablet 5 09/14/2018 at Unknown time  . vitamin B-12 (CYANOCOBALAMIN) 500 MCG tablet Take 500 mcg by mouth daily.   09/14/2018 at Unknown time   Scheduled: . sodium chloride   Intravenous Once  . atorvastatin  10 mg Oral Daily  . darifenacin  15 mg Oral Daily  . ferrous sulfate  325 mg Oral Daily  . fluticasone furoate-vilanterol  1 puff Inhalation Daily  . insulin pump   Subcutaneous TID AC, HS, 0200  . mouth rinse  15 mL Mouth Rinse BID  . montelukast  10 mg Oral QHS  . pantoprazole  40 mg Oral BID  . umeclidinium bromide  1 puff Inhalation Daily  . vitamin B-12  500 mcg Oral Daily   Continuous: . sodium chloride Stopped (09/17/18 1224)   ULA:GTXMIWOEHOZYY **OR** acetaminophen, cyclobenzaprine, ondansetron **OR** ondansetron (ZOFRAN) IV Anti-infectives (From admission, onward)   None     Scheduled Meds: . sodium chloride    Intravenous Once  . atorvastatin  10 mg Oral Daily  . darifenacin  15 mg Oral Daily  . ferrous sulfate  325 mg Oral Daily  . fluticasone furoate-vilanterol  1 puff Inhalation Daily  . insulin pump   Subcutaneous TID AC, HS, 0200  . mouth rinse  15 mL Mouth Rinse BID  . montelukast  10 mg Oral QHS  . pantoprazole  40 mg Oral BID  . umeclidinium bromide  1 puff Inhalation Daily  . vitamin B-12  500 mcg Oral Daily   Continuous Infusions: . sodium chloride Stopped (09/17/18 1224)   PRN Meds:.acetaminophen **OR** acetaminophen, cyclobenzaprine, ondansetron **OR** ondansetron (ZOFRAN) IV   Assessment: Active Problems:   GI bleed   AVM (arteriovenous malformation) of small bowel, acquired   Acute gastric ulcer with hemorrhage Patient is not currently bleeding at this time BUN back at baseline Status post EGD on 09/16/2018 - A single non-bleeding angioectasia in the duodenum. Treated with argon plasma coagulation (APC). - Non-bleeding gastric ulcer with a nonbleeding visible vessel (Forrest Class IIa). Treated with bipolar cautery. - Non-bleeding gastric ulcers with a clean ulcer base (Forrest Class III). Biopsied. - Esophagogastric landmarks identified. - Normal gastroesophageal junction and esophagus.  Plan: Continue Protonix or Prilosec 40 mg twice daily long-term Avoid NSAIDs completely Monitor CBC closely, check posttransfusion H&H Follow-up with GI in 2 to 3 weeks after discharge Recommend repeat EGD in 2 to 3 months to confirm healing of gastric ulcers     LOS: 3 days   Rohini Vanga 09/17/2018, 6:38 PM

## 2018-09-17 NOTE — Progress Notes (Signed)
Karen [name] to be D/C'd Home per MD order.  Discussed prescriptions and follow up appointments with the patient. Prescriptions given to patient, medication list explained in detail. Pt verbalized understanding.  Allergies as of 09/17/2018      Reactions   Ace Inhibitors Hives   Gabapentin Hives   Lisinopril Hives   Lyrica [pregabalin] Hives   Shrimp [shellfish Allergy] Swelling   Swelling of the lips      Medication List    STOP taking these medications   aspirin 81 MG chewable tablet     TAKE these medications   atorvastatin 10 MG tablet Commonly known as: LIPITOR Take 1 tablet by mouth daily.   Breo Ellipta 100-25 MCG/INH Aepb Generic drug: fluticasone furoate-vilanterol Inhale 1 puff into the lungs daily.   cyclobenzaprine 10 MG tablet Commonly known as: FLEXERIL Take 1 tablet (10 mg total) by mouth 3 (three) times daily as needed for muscle spasms.   Fluticasone-Umeclidin-Vilant 100-62.5-25 MCG/INH Aepb Inhale 1 puff into the lungs daily.   furosemide 40 MG tablet Commonly known as: LASIX Take 1 tablet (40 mg total) by mouth 2 (two) times daily.   insulin pump Soln Inject 1 each into the skin 3 times daily with meals, bedtime and 2 AM. What changed: additional instructions   Invokana 100 MG Tabs tablet Generic drug: canagliflozin Take 1 tablet by mouth daily.   Iron 325 (65 Fe) MG Tabs Take 1 tablet by mouth daily.   losartan 100 MG tablet Commonly known as: COZAAR Take 100 mg by mouth daily.   metFORMIN 500 MG tablet Commonly known as: GLUCOPHAGE Take 1,000 mg by mouth 2 (two) times daily with a meal.   montelukast 10 MG tablet Commonly known as: SINGULAIR Take 10 mg by mouth at bedtime.   solifenacin 10 MG tablet Commonly known as: VESICARE Take 1 tablet (10 mg total) by mouth daily.   vitamin B-12 500 MCG tablet Commonly known as: CYANOCOBALAMIN Take 500 mcg by mouth daily.       Vitals:   09/17/18 1530 09/17/18 1828  BP: (!) 134/54  (!) 144/69  Pulse: 73 79  Resp: 17 17  Temp: 98.2 F (36.8 C) 98 F (36.7 C)  SpO2: 97% 100%    Skin clean, dry and intact without evidence of skin break down, no evidence of skin tears noted. IV catheter discontinued intact. Site without signs and symptoms of complications. Dressing and pressure applied. Pt denies pain at this time. No complaints noted.  An After Visit Summary was printed and given to the patient. Patient escorted via [place], and D/C home via private auto.  Fuller Mandril, RN

## 2018-09-17 NOTE — Care Management Important Message (Signed)
Important Message  Patient Details  Name: Karen [name] MRN: [id number] Date of Birth: 11/17/1945   Medicare Important Message Given:  Yes     [name] 09/17/2018, 11:16 AM

## 2018-09-18 ENCOUNTER — Telehealth (HOSPITAL_COMMUNITY): Payer: Self-pay

## 2018-09-18 LAB — TYPE AND SCREEN
ABO/RH(D): A NEG
Antibody Screen: NEGATIVE
Unit division: 0

## 2018-09-18 LAB — SURGICAL PATHOLOGY

## 2018-09-18 LAB — BPAM RBC
Blood Product Expiration Date: 202008012359
ISSUE DATE / TIME: 202007141425
Unit Type and Rh: 600

## 2018-09-18 NOTE — Telephone Encounter (Signed)
Today contacted Karen [name] to see how she is feeling since discharge yesterday.  She states just weak and tired.  She states she is aware to quit aspirin and continue the rest.  She states appetite is good.  She does not want a visit due to being tired.  She is aware of appts coming up.  She does have home health that will getting in touch with her, she hopes they wait a couple of days.  She says she is good.  Meds verified and she is aware of what to take.  She lives with her husband that helps her out a lot.  She denies chest pain, swelling, headache or dizziness today.  She is glad to be home.  Asked if there is anything she needs and she advised no, not at this time.   Will continue to visit for heart failure.   [place] [phone]

## 2018-09-18 NOTE — Telephone Encounter (Signed)
Attempted to contact, no answer left message.  Will continue to try to contact.   [place] [phone]

## 2018-09-24 NOTE — Progress Notes (Signed)
Cardiology Office Note Date:  09/30/2018  Patient ID:  Karen [name], Karen [name] 08/27/1945, MRN [id number] PCP:  Ricardo Jericho, NP  Cardiologist:  Dr. Rockey Situ, MD    Chief Complaint: Hospital follow up  History of Present Illness: EVANGELYN CROUSE is a 73 y.o. 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   Ms. Parrella was previously seen by [facility] in 2017 with echo at that time showing an EF of 50%, mild LVH, mild MR/TR, mild pulmonary hypertension. More recently, she was admitted to the hospital in 06/2018 with acute on chronic hypoxic respiratory failure felt to be multifactorial including acte on chronic diastolic CHF, COPD, and morbid obesity. Echo showed an EF of 50-55%, normal LV diastolic function, mild thickening of the mitral valve, mild AI, mild AS. With diuresis, symptoms improved. She was admitted to the hospital in 08/2018 with acute on chronic HFpEF and junctional bradycardia and associated hypotension. Echo showed an EF of 50-55%, mild concentric LVH, RVSF low normal, PASP 60.4 mmHg, mild biatrial enlargement, mild to moderate TR, mild AS. It was noted she was started on bisoprolol during her 06/2018 with a prior baseline low heart rate as well as a mildly elevated TSH, with recent TSH being normal. She was placed on dopamine and diuresed. With holding of her beta blocker, her junctional bradycardia resolved and she was able to be weaned from dopamine. Following this admission, she was seen by her primary cardiologist virtually as well as the CHF clinic and paramedicine service.   She was recently admitted to the hospital from 7/11-7/14 for GI bleed and hematemesis. CT  abdomen/pelvis showed diverticulosis. 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 She did require 1 unit pRBC during her admission:  Patient comes in accompanied by her husband today and is doing very well from a cardiac perspective.  No chest pain, shortness of breath, palpitations, dizziness, presyncope, syncope, lower extremity swelling, abdominal distention, orthopnea, or PND.  She denies any falls, BRBPR, or melena.  Her weight has been stable around 209 pounds since her hospital discharge which she feels is her approximate dry weight.  She is able to finish her breakfast and lunch though is usually not able to finish her dinner meal.  Continues to take Lasix 40 mg twice daily along with losartan 100 mg daily.  She remains on supplemental oxygen 3 L via nasal cannula which she has been on for several years.  Her lower extremity swelling is significantly improved.  She typically notes the swelling is improved first thing in the morning and progresses as the day goes along.  She does try and elevate her legs when sitting at home.  She does not wear compression stockings.  She does not have any issues or concerns at this time.  Labs: HGB 8.9, K+ 3.6, SCr 0.66, AST/ALT normal   Past Medical History:  Diagnosis Date  . (HFpEF) heart failure with preserved ejection fraction ([place]) 2017   (1) TTE 2017 a. EF 50% b. mild LVH c. mild MR/TR, d. mild LAE e. mild pulmonary HTN  (2) TTE 06/2018 a. EF 50-55%, mild AS with thickening  of valve   . Acute on chronic respiratory failure with hypoxia and hypercapnia ([place]) 01/07/2015  . Anemia   . 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 Asthma   . Cataract   . Chronic kidney disease 08/10/2017  . CKD (chronic kidney disease)   . COPD (chronic obstructive pulmonary disease) ([place])    (1) 06/2018 tobacco use, home 3L oxygen   . Diabetes mellitus without complication (HCC)    (1) A1C 7.7  (06/2018)  . Edema, peripheral 04/20/2014  . History of kidney stones   . Hyperlipidemia   . Hypertension   . Iron deficiency anemia 06/22/2014  . Leucocytosis 10/19/2015  . Overactive bladder   . Primary osteoarthritis of right knee 09/01/2016  . Renal insufficiency   . Sciatica 01/07/2015    Past Surgical History:  Procedure Laterality Date  . APPENDECTOMY    . CESAREAN SECTION     x3  . CHOLECYSTECTOMY    . COLONOSCOPY WITH PROPOFOL N/A 08/28/2017   Procedure: COLONOSCOPY WITH PROPOFOL;  Surgeon: Lucilla Lame, MD;  Location: [facility] ENDOSCOPY;  Service: Endoscopy;  Laterality: N/A;  . COLONOSCOPY WITH PROPOFOL N/A 08/29/2017   Procedure: COLONOSCOPY WITH PROPOFOL;  Surgeon: Lucilla Lame, MD;  Location: [facility] ENDOSCOPY;  Service: Endoscopy;  Laterality: N/A;  . CYSTOSCOPY W/ URETERAL STENT PLACEMENT Right 09/15/2017   Procedure: CYSTOSCOPY WITH RETROGRADE PYELOGRAM/URETERAL STENT PLACEMENT;  Surgeon: Cleon Gustin, MD;  Location: [facility] ORS;  Service: Urology;  Laterality: Right;  . CYSTOSCOPY/URETEROSCOPY/HOLMIUM LASER/STENT PLACEMENT Right 10/09/2017   Procedure: CYSTOSCOPY/URETEROSCOPY/HOLMIUM LASER/STENT PLACEMENT;  Surgeon: Abbie Sons, MD;  Location: [facility] ORS;  Service: Urology;  Laterality: Right;  right Stent exchange  . ESOPHAGOGASTRODUODENOSCOPY (EGD) WITH PROPOFOL N/A 09/16/2018   Procedure: ESOPHAGOGASTRODUODENOSCOPY (EGD) WITH PROPOFOL;  Surgeon: Lin Landsman, MD;  Location: [place];  Service: Gastroenterology;  Laterality: N/A;  . EYE SURGERY      Current Meds  Medication Sig  . atorvastatin (LIPITOR) 10 MG tablet Take 1 tablet by mouth daily.  . canagliflozin (INVOKANA) 100 MG TABS tablet Take 1 tablet by mouth daily.  . cyclobenzaprine (FLEXERIL) 10 MG tablet Take 1 tablet (10 mg total) by mouth 3 (three) times daily as needed for muscle spasms.  . Ferrous Sulfate (IRON) 325 (65 Fe) MG TABS Take 1 tablet by mouth daily.  . fluticasone furoate-vilanterol  (BREO ELLIPTA) 100-25 MCG/INH AEPB Inhale 1 puff into the lungs daily.  . Fluticasone-Umeclidin-Vilant 100-62.5-25 MCG/INH AEPB Inhale 1 puff into the lungs daily.  . furosemide (LASIX) 40 MG tablet Take 1 tablet (40 mg total) by mouth 2 (two) times daily.  . Insulin Human (INSULIN PUMP) SOLN Inject 1 each into the skin 3 times daily with meals, bedtime and 2 AM. (Patient taking differently: Inject 1 each into the skin 3 times daily with meals, bedtime and 2 AM. Humalog Insulin 76 units daily)  . losartan (COZAAR) 100 MG tablet Take 100 mg by mouth daily.  . metFORMIN (GLUCOPHAGE) 500 MG tablet Take 1,000 mg by mouth 2 (two) times daily with a meal.   . montelukast (SINGULAIR) 10 MG tablet Take 10 mg by mouth at bedtime.  . solifenacin (VESICARE) 10 MG tablet Take 1 tablet (10 mg total) by mouth daily.  . vitamin B-12 (CYANOCOBALAMIN) 500 MCG tablet Take 500 mcg by mouth daily.    Allergies:   Ace inhibitors, Gabapentin, Lisinopril, Lyrica [pregabalin], and Shrimp [shellfish allergy]   Social History:  The patient  reports that she quit smoking about 25 years ago. She  has a 20.00 pack-year smoking history. She has never used smokeless tobacco. She reports that she does not drink alcohol or use drugs.   Family History:  The patient's family history includes Other in her father and mother.  ROS:   Review of Systems  Constitutional: Positive for malaise/fatigue. Negative for chills, diaphoresis, fever and weight loss.  HENT: Negative for congestion.   Eyes: Negative for discharge and redness.  Respiratory: Negative for cough, hemoptysis, sputum production, shortness of breath and wheezing.   Cardiovascular: Positive for leg swelling. Negative for chest pain, palpitations, orthopnea, claudication and PND.       Trace bilateral lower extremity swelling  Gastrointestinal: Negative for abdominal pain, blood in stool, heartburn, melena, nausea and vomiting.  Genitourinary: Negative for hematuria.   Musculoskeletal: Negative for falls and myalgias.  Skin: Negative for rash.  Neurological: Negative for dizziness, tingling, tremors, sensory change, speech change, focal weakness, loss of consciousness and weakness.  Endo/Heme/Allergies: Does not bruise/bleed easily.  Psychiatric/Behavioral: Negative for substance abuse. The patient is not nervous/anxious.      PHYSICAL EXAM:  VS:  BP 130/70 (BP Location: Right Arm, Patient Position: Sitting, Cuff Size: Normal)   Pulse 62   Temp 98.6 F (37 C)   Ht 5\' 3"  (1.6 m)   Wt 210 lb 12 oz (95.6 kg)   SpO2 92% Comment: on 3LO2  BMI 37.33 kg/m  BMI: Body mass index is 37.33 kg/m.  Physical Exam  Constitutional: She is oriented to person, place, and time. She appears well-developed and well-nourished.  HENT:  Head: Normocephalic and atraumatic.  Eyes: Right eye exhibits no discharge. Left eye exhibits no discharge.  Neck: Normal range of motion. No JVD present.  Cardiovascular: Normal rate, regular rhythm, S1 normal and S2 normal. Exam reveals no distant heart sounds, no friction rub, no midsystolic click and no opening snap.  Murmur heard.  Harsh midsystolic murmur is present with a grade of 1/6 at the upper right sternal border radiating to the neck. Pulses:      Posterior tibial pulses are 2+ on the right side and 2+ on the left side.  Pulmonary/Chest: Effort normal and breath sounds normal. No respiratory distress. She has no decreased breath sounds. She has no wheezes. She has no rales. She exhibits no tenderness.  Abdominal: Soft. She exhibits no distension. There is no abdominal tenderness.  Musculoskeletal:        General: Edema present.     Comments: Trace bilateral pretibial edema  Neurological: She is alert and oriented to person, place, and time.  Skin: Skin is warm and dry. No cyanosis. Nails show no clubbing.  Psychiatric: She has a normal mood and affect. Her speech is normal and behavior is normal. Judgment and thought  content normal.     EKG:  Was ordered and interpreted by me today. Shows NSR, 62 bpm, normal axis, no acute ST-T changes  Recent Labs: 08/05/2018: B Natriuretic Peptide 831.0; Magnesium 2.4; TSH 3.242 09/14/2018: ALT 13 09/16/2018: BUN 29; Creatinine, Ser 0.66; Platelets 190; Potassium 3.6; Sodium 143 09/17/2018: Hemoglobin 8.9  No results found for requested labs within last 8760 hours.   Estimated Creatinine Clearance: 67.9 mL/min (by C-G formula based on SCr of 0.66 mg/dL).   Wt Readings from Last 3 Encounters:  09/30/18 210 lb 12 oz (95.6 kg)  09/14/18 206 lb (93.4 kg)  08/21/18 210 lb (95.3 kg)     Other studies reviewed: Additional studies/records reviewed today include: summarized above  ASSESSMENT AND  PLAN:  1. HFpEF/pulmonary hypertension: She is NYHA class III.  She appears euvolemic and well compensated.  At her approximate dry weight.  Continue Lasix 40 mg twice daily.  On stable long-term dose of supplemental oxygen as outlined below.  Check BMP.  CHF education.  2. Beta-blocker induced bradycardia: Resolved following discontinuation of beta-blocker.  Currently maintaining sinus rhythm heart rate of 62 bpm.  Recommend to continue to avoid beta blocker and non-dihydropyridine calcium channel blockers.  No indication for PPM at this time.  3. Recent GI bleed with acute blood loss anemia: She denies any further symptoms of bleeding.  Status post EGD with intervention as outlined above.  Follow-up with GI as recommended.  Check CBC to ensure stable hemoglobin.  Remains off aspirin.  4. Chronic hypoxic respiratory failure: On baseline 3 L via nasal cannula.  5. COPD: Stable.  Followed by PCP.  6. Mild aortic stenosis: Asymptomatic.  Follow-up echocardiogram in 12 months.  7. Hypertension: Blood pressure is reasonably controlled today.  Continue current doses of losartan and Lasix.  Check BMP as above.  8. Hyperlipidemia: LDL of 43 from 01/2018.  Remains on Lipitor 10 mg  daily.  Followed by PCP.  9. Venous insufficiency: Continued leg elevation.  Consider compression stockings.  Remains on Lasix as above.  Disposition: F/u with Dr. Rockey Situ or an APP in 3 months.  Current medicines are reviewed at length with the patient today.  The patient did not have any concerns regarding medicines.  Signed, Christell Faith, PA-C 09/30/2018 1:28 PM     [address] [place], [place] [address] [phone]

## 2018-09-26 ENCOUNTER — Ambulatory Visit: Payer: Medicare Other | Admitting: Physician Assistant

## 2018-09-30 ENCOUNTER — Encounter: Payer: Self-pay | Admitting: Physician Assistant

## 2018-09-30 ENCOUNTER — Other Ambulatory Visit: Payer: Self-pay

## 2018-09-30 ENCOUNTER — Ambulatory Visit (INDEPENDENT_AMBULATORY_CARE_PROVIDER_SITE_OTHER): Payer: Medicare Other | Admitting: Physician Assistant

## 2018-09-30 ENCOUNTER — Telehealth (HOSPITAL_COMMUNITY): Payer: Self-pay

## 2018-09-30 VITALS — BP 130/70 | HR 62 | Temp 98.6°F | Ht 63.0 in | Wt 210.8 lb

## 2018-09-30 DIAGNOSIS — J449 Chronic obstructive pulmonary disease, unspecified: Secondary | ICD-10-CM

## 2018-09-30 DIAGNOSIS — J9611 Chronic respiratory failure with hypoxia: Secondary | ICD-10-CM

## 2018-09-30 DIAGNOSIS — E782 Mixed hyperlipidemia: Secondary | ICD-10-CM

## 2018-09-30 DIAGNOSIS — I272 Pulmonary hypertension, unspecified: Secondary | ICD-10-CM | POA: Diagnosis not present

## 2018-09-30 DIAGNOSIS — D62 Acute posthemorrhagic anemia: Secondary | ICD-10-CM

## 2018-09-30 DIAGNOSIS — I350 Nonrheumatic aortic (valve) stenosis: Secondary | ICD-10-CM

## 2018-09-30 DIAGNOSIS — R001 Bradycardia, unspecified: Secondary | ICD-10-CM

## 2018-09-30 DIAGNOSIS — K922 Gastrointestinal hemorrhage, unspecified: Secondary | ICD-10-CM | POA: Diagnosis not present

## 2018-09-30 DIAGNOSIS — I5032 Chronic diastolic (congestive) heart failure: Secondary | ICD-10-CM

## 2018-09-30 DIAGNOSIS — I10 Essential (primary) hypertension: Secondary | ICD-10-CM

## 2018-09-30 NOTE — Patient Instructions (Addendum)
Medication Instructions:  No changes at this time.  If you need a refill on your cardiac medications before your next appointment, please call your pharmacy.   Lab work: BMET & CBC today  If you have labs (blood work) drawn today and your tests are completely normal, you will receive your results only by: Marland Kitchen MyChart Message (if you have MyChart) OR . A paper copy in the mail If you have any lab test that is abnormal or we need to change your treatment, we will call you to review the results.  Testing/Procedures: None  Follow-Up: At [facility], you and your health needs are our priority.  As part of our continuing mission to provide you with exceptional heart care, we have created designated Provider Care Teams.  These Care Teams include your primary Cardiologist (physician) and Advanced Practice Providers (APPs -  Physician Assistants and Nurse Practitioners) who all work together to provide you with the care you need, when you need it. You will need a follow up appointment in 3 months.  Please call our office 2 months in advance to schedule this appointment.  You may see Ida Rogue, MD or one of the following Advanced Practice Providers on your designated Care Team:   Murray Hodgkins, NP Christell Faith, PA-C . Marrianne Mood, PA-C  Any Other Special Instructions Will Be Listed Below (If Applicable).

## 2018-09-30 NOTE — Telephone Encounter (Signed)
Today was contacted by [name] and she was stating she does not want [facility] to come out anymore.  She is doing fine.  She has too many doctor appts to go to to have so many people to come to her house.  She denies swelling, chest pain, shortness of breath at rest or dizziness.  She states her weight is maintaining at same.  She has all her medications, verified them.  Will contact [facility] and advise them, did advise her they will have to come to discharge her, she states ok.  Her appetite is good and she is still willing to let me visit when need so and to phone her and check on her.  Will continue to visit for heart failure, medication compliance and diet.   [place] [phone]

## 2018-10-01 ENCOUNTER — Ambulatory Visit: Payer: Medicare Other | Admitting: Family

## 2018-10-01 LAB — CBC
Hematocrit: 31.1 % — ABNORMAL LOW (ref 34.0–46.6)
Hemoglobin: 10.2 g/dL — ABNORMAL LOW (ref 11.1–15.9)
MCH: 29 pg (ref 26.6–33.0)
MCHC: 32.8 g/dL (ref 31.5–35.7)
MCV: 88 fL (ref 79–97)
Platelets: 335 10*3/uL (ref 150–450)
RBC: 3.52 x10E6/uL — ABNORMAL LOW (ref 3.77–5.28)
RDW: 16.6 % — ABNORMAL HIGH (ref 11.7–15.4)
WBC: 14.5 10*3/uL — ABNORMAL HIGH (ref 3.4–10.8)

## 2018-10-01 LAB — BASIC METABOLIC PANEL
BUN/Creatinine Ratio: 27 (ref 12–28)
BUN: 26 mg/dL (ref 8–27)
CO2: 26 mmol/L (ref 20–29)
Calcium: 9.6 mg/dL (ref 8.7–10.3)
Chloride: 92 mmol/L — ABNORMAL LOW (ref 96–106)
Creatinine, Ser: 0.98 mg/dL (ref 0.57–1.00)
GFR calc Af Amer: 66 mL/min/{1.73_m2} (ref >59)
GFR calc non Af Amer: 57 mL/min/{1.73_m2} — ABNORMAL LOW (ref >59)
Glucose: 149 mg/dL — ABNORMAL HIGH (ref 65–99)
Potassium: 3.9 mmol/L (ref 3.5–5.2)
Sodium: 138 mmol/L (ref 134–144)

## 2018-10-16 ENCOUNTER — Telehealth (HOSPITAL_COMMUNITY): Payer: Self-pay

## 2018-10-16 NOTE — Telephone Encounter (Signed)
Today was a telephone visit with Karen [name].  Only complaint today is knee hurting, she is going today to have a shot put in it.  She is able to get around with her walker.  She states been feeling good. She has all her medications, verified them.  She state appetite has been good, she watches her sodium and fluids.  Her weight is down to 204 lbs.  She denies swelling in extremities or feeling tight in her abdomen.  She denies chest pain, shortness of breath at rest, headaches or dizziness.  She states she does not need anything that she knows of.  She has cancelled [facility] from coming out.  Her husband lives with her and takes good care of her.  She is aware of how to take her medications.  Advised her to continue to weigh daily if any weight gain on 3 lbs or more to contact me or HF clinic.  Will continue to visit for heart failure.   [place] [phone]

## 2018-10-22 ENCOUNTER — Ambulatory Visit: Payer: Medicare Other | Admitting: Gastroenterology

## 2018-10-24 ENCOUNTER — Ambulatory Visit: Payer: Medicare Other | Admitting: Family

## 2018-11-13 ENCOUNTER — Ambulatory Visit
Admission: RE | Admit: 2018-11-13 | Discharge: 2018-11-13 | Disposition: A | Discharge status: Home / Self Care | Payer: Medicare Other | Source: Ambulatory Visit | Attending: Family Medicine | Admitting: Family Medicine

## 2018-11-13 ENCOUNTER — Other Ambulatory Visit: Payer: Self-pay

## 2018-11-13 DIAGNOSIS — Z780 Asymptomatic menopausal state: Secondary | ICD-10-CM | POA: Diagnosis not present

## 2018-11-13 DIAGNOSIS — E2839 Other primary ovarian failure: Secondary | ICD-10-CM | POA: Diagnosis present

## 2018-11-18 ENCOUNTER — Ambulatory Visit: Payer: Medicare Other | Admitting: Gastroenterology

## 2018-11-19 ENCOUNTER — Ambulatory Visit: Payer: Medicare Other | Admitting: Gastroenterology

## 2018-11-20 DIAGNOSIS — M85852 Other specified disorders of bone density and structure, left thigh: Secondary | ICD-10-CM | POA: Insufficient documentation

## 2018-11-26 ENCOUNTER — Telehealth (HOSPITAL_COMMUNITY): Payer: Self-pay

## 2018-11-26 NOTE — Telephone Encounter (Signed)
Today went through [place] chart and appears with her appts she has been doing well.  Contacted her and she states she has been doing well.  Spoke with her about discharging her from the program and she is good with that.  Made her aware if she has any problems keep my number and she can call me.  She has her medications and aware of how to take them.  She watches her diet and went through the [facility] meals.  Darylene Price with the HF Clinic is aware and agree.    [place] [phone]

## 2019-01-07 ENCOUNTER — Encounter: Payer: Self-pay | Admitting: Gastroenterology

## 2019-01-07 ENCOUNTER — Ambulatory Visit: Payer: Medicare Other | Admitting: Gastroenterology

## 2019-01-07 ENCOUNTER — Other Ambulatory Visit: Payer: Self-pay

## 2019-01-07 DIAGNOSIS — K254 Chronic or unspecified gastric ulcer with hemorrhage: Secondary | ICD-10-CM

## 2019-02-01 ENCOUNTER — Other Ambulatory Visit: Payer: Self-pay | Admitting: Urology

## 2019-02-13 ENCOUNTER — Encounter: Payer: Self-pay | Admitting: Emergency Medicine

## 2019-02-13 ENCOUNTER — Inpatient Hospital Stay
Admission: EM | Admit: 2019-02-13 | Discharge: 2019-02-16 | DRG: 291 | Disposition: A | Discharge status: Home / Self Care | Payer: Medicare Other | Attending: Internal Medicine | Admitting: Internal Medicine

## 2019-02-13 ENCOUNTER — Emergency Department: Payer: Medicare Other

## 2019-02-13 ENCOUNTER — Other Ambulatory Visit: Payer: Self-pay

## 2019-02-13 ENCOUNTER — Ambulatory Visit (INDEPENDENT_AMBULATORY_CARE_PROVIDER_SITE_OTHER)
Admission: EM | Admit: 2019-02-13 | Discharge: 2019-02-13 | Disposition: A | Discharge status: Other Acute Inpatient Hospital | Payer: Medicare Other | Source: Home / Self Care

## 2019-02-13 ENCOUNTER — Encounter: Payer: Self-pay | Admitting: Intensive Care

## 2019-02-13 DIAGNOSIS — M545 Low back pain, unspecified: Secondary | ICD-10-CM

## 2019-02-13 DIAGNOSIS — E1165 Type 2 diabetes mellitus with hyperglycemia: Secondary | ICD-10-CM | POA: Diagnosis present

## 2019-02-13 DIAGNOSIS — E0865 Diabetes mellitus due to underlying condition with hyperglycemia: Secondary | ICD-10-CM | POA: Diagnosis not present

## 2019-02-13 DIAGNOSIS — Z794 Long term (current) use of insulin: Secondary | ICD-10-CM | POA: Diagnosis not present

## 2019-02-13 DIAGNOSIS — Z888 Allergy status to other drugs, medicaments and biological substances status: Secondary | ICD-10-CM | POA: Diagnosis not present

## 2019-02-13 DIAGNOSIS — I10 Essential (primary) hypertension: Secondary | ICD-10-CM | POA: Diagnosis not present

## 2019-02-13 DIAGNOSIS — R0602 Shortness of breath: Secondary | ICD-10-CM | POA: Insufficient documentation

## 2019-02-13 DIAGNOSIS — R2243 Localized swelling, mass and lump, lower limb, bilateral: Secondary | ICD-10-CM

## 2019-02-13 DIAGNOSIS — Z87891 Personal history of nicotine dependence: Secondary | ICD-10-CM

## 2019-02-13 DIAGNOSIS — Z9981 Dependence on supplemental oxygen: Secondary | ICD-10-CM | POA: Diagnosis not present

## 2019-02-13 DIAGNOSIS — I5033 Acute on chronic diastolic (congestive) heart failure: Secondary | ICD-10-CM | POA: Diagnosis present

## 2019-02-13 DIAGNOSIS — N1831 Chronic kidney disease, stage 3a: Secondary | ICD-10-CM | POA: Diagnosis present

## 2019-02-13 DIAGNOSIS — J449 Chronic obstructive pulmonary disease, unspecified: Secondary | ICD-10-CM | POA: Diagnosis present

## 2019-02-13 DIAGNOSIS — R600 Localized edema: Secondary | ICD-10-CM

## 2019-02-13 DIAGNOSIS — I495 Sick sinus syndrome: Secondary | ICD-10-CM | POA: Diagnosis present

## 2019-02-13 DIAGNOSIS — E785 Hyperlipidemia, unspecified: Secondary | ICD-10-CM | POA: Diagnosis present

## 2019-02-13 DIAGNOSIS — Z6838 Body mass index (BMI) 38.0-38.9, adult: Secondary | ICD-10-CM | POA: Diagnosis not present

## 2019-02-13 DIAGNOSIS — E669 Obesity, unspecified: Secondary | ICD-10-CM | POA: Diagnosis present

## 2019-02-13 DIAGNOSIS — R001 Bradycardia, unspecified: Secondary | ICD-10-CM | POA: Insufficient documentation

## 2019-02-13 DIAGNOSIS — E1122 Type 2 diabetes mellitus with diabetic chronic kidney disease: Secondary | ICD-10-CM | POA: Diagnosis present

## 2019-02-13 DIAGNOSIS — I5031 Acute diastolic (congestive) heart failure: Secondary | ICD-10-CM

## 2019-02-13 DIAGNOSIS — N179 Acute kidney failure, unspecified: Secondary | ICD-10-CM | POA: Diagnosis present

## 2019-02-13 DIAGNOSIS — R0789 Other chest pain: Secondary | ICD-10-CM | POA: Insufficient documentation

## 2019-02-13 DIAGNOSIS — Z66 Do not resuscitate: Secondary | ICD-10-CM | POA: Diagnosis present

## 2019-02-13 DIAGNOSIS — Z87442 Personal history of urinary calculi: Secondary | ICD-10-CM

## 2019-02-13 DIAGNOSIS — D72829 Elevated white blood cell count, unspecified: Secondary | ICD-10-CM | POA: Diagnosis present

## 2019-02-13 DIAGNOSIS — E119 Type 2 diabetes mellitus without complications: Secondary | ICD-10-CM

## 2019-02-13 DIAGNOSIS — R350 Frequency of micturition: Secondary | ICD-10-CM | POA: Diagnosis present

## 2019-02-13 DIAGNOSIS — I130 Hypertensive heart and chronic kidney disease with heart failure and stage 1 through stage 4 chronic kidney disease, or unspecified chronic kidney disease: Principal | ICD-10-CM | POA: Diagnosis present

## 2019-02-13 DIAGNOSIS — G8929 Other chronic pain: Secondary | ICD-10-CM | POA: Diagnosis present

## 2019-02-13 DIAGNOSIS — Z9641 Presence of insulin pump (external) (internal): Secondary | ICD-10-CM | POA: Diagnosis present

## 2019-02-13 DIAGNOSIS — R079 Chest pain, unspecified: Secondary | ICD-10-CM | POA: Insufficient documentation

## 2019-02-13 DIAGNOSIS — I509 Heart failure, unspecified: Secondary | ICD-10-CM

## 2019-02-13 DIAGNOSIS — J9611 Chronic respiratory failure with hypoxia: Secondary | ICD-10-CM | POA: Diagnosis present

## 2019-02-13 DIAGNOSIS — Z20828 Contact with and (suspected) exposure to other viral communicable diseases: Secondary | ICD-10-CM | POA: Diagnosis present

## 2019-02-13 HISTORY — DX: Morbid (severe) obesity due to excess calories: E66.01

## 2019-02-13 HISTORY — DX: Bradycardia, unspecified: R00.1

## 2019-02-13 HISTORY — DX: Chronic kidney disease, stage 3 unspecified: N18.30

## 2019-02-13 LAB — TROPONIN I (HIGH SENSITIVITY)
Troponin I (High Sensitivity): 6 ng/L (ref <18)
Troponin I (High Sensitivity): 6 ng/L (ref <18)

## 2019-02-13 LAB — BASIC METABOLIC PANEL
Anion gap: 13 (ref 5–15)
BUN: 72 mg/dL — ABNORMAL HIGH (ref 8–23)
CO2: 24 mmol/L (ref 22–32)
Calcium: 8.9 mg/dL (ref 8.9–10.3)
Chloride: 99 mmol/L (ref 98–111)
Creatinine, Ser: 2.25 mg/dL — ABNORMAL HIGH (ref 0.44–1.00)
GFR calc Af Amer: 24 mL/min — ABNORMAL LOW (ref >60)
GFR calc non Af Amer: 21 mL/min — ABNORMAL LOW (ref >60)
Glucose, Bld: 280 mg/dL — ABNORMAL HIGH (ref 70–99)
Potassium: 5.7 mmol/L — ABNORMAL HIGH (ref 3.5–5.1)
Sodium: 136 mmol/L (ref 135–145)

## 2019-02-13 LAB — CBC
HCT: 31.6 % — ABNORMAL LOW (ref 36.0–46.0)
Hemoglobin: 10.3 g/dL — ABNORMAL LOW (ref 12.0–15.0)
MCH: 28.7 pg (ref 26.0–34.0)
MCHC: 32.6 g/dL (ref 30.0–36.0)
MCV: 88 fL (ref 80.0–100.0)
Platelets: 245 10*3/uL (ref 150–400)
RBC: 3.59 MIL/uL — ABNORMAL LOW (ref 3.87–5.11)
RDW: 15.8 % — ABNORMAL HIGH (ref 11.5–15.5)
WBC: 13.1 10*3/uL — ABNORMAL HIGH (ref 4.0–10.5)
nRBC: 0 % (ref 0.0–0.2)

## 2019-02-13 LAB — URINALYSIS, COMPLETE (UACMP) WITH MICROSCOPIC
Bilirubin Urine: NEGATIVE
Glucose, UA: NEGATIVE mg/dL
Hgb urine dipstick: NEGATIVE
Ketones, ur: NEGATIVE mg/dL
Nitrite: NEGATIVE
Protein, ur: NEGATIVE mg/dL
Specific Gravity, Urine: 1.017 (ref 1.005–1.030)
Squamous Epithelial / HPF: NONE SEEN (ref 0–5)
WBC, UA: >50 WBC/hpf — ABNORMAL HIGH (ref 0–5)
pH: 5 (ref 5.0–8.0)

## 2019-02-13 LAB — BRAIN NATRIURETIC PEPTIDE: B Natriuretic Peptide: 622 pg/mL — ABNORMAL HIGH (ref 0.0–100.0)

## 2019-02-13 MED ORDER — MONTELUKAST SODIUM 10 MG PO TABS
10.0000 mg | ORAL_TABLET | Freq: Every day | ORAL | Status: DC
  Administered 2019-02-13 – 2019-02-15 (×3): 10 mg via ORAL
  Filled 2019-02-13 (×4): qty 1

## 2019-02-13 MED ORDER — DARIFENACIN HYDROBROMIDE ER 15 MG PO TB24
15.0000 mg | ORAL_TABLET | Freq: Every day | ORAL | Status: DC
  Administered 2019-02-16: 15 mg via ORAL
  Filled 2019-02-13 (×5): qty 1

## 2019-02-13 MED ORDER — UMECLIDINIUM BROMIDE 62.5 MCG/INH IN AEPB
1.0000 | INHALATION_SPRAY | Freq: Every day | RESPIRATORY_TRACT | Status: DC
  Administered 2019-02-14 – 2019-02-16 (×3): 1 via RESPIRATORY_TRACT
  Filled 2019-02-13 (×2): qty 7

## 2019-02-13 MED ORDER — IPRATROPIUM-ALBUTEROL 0.5-2.5 (3) MG/3ML IN SOLN
3.0000 mL | Freq: Four times a day (QID) | RESPIRATORY_TRACT | Status: DC
  Administered 2019-02-13 – 2019-02-15 (×5): 3 mL via RESPIRATORY_TRACT
  Filled 2019-02-13 (×5): qty 3

## 2019-02-13 MED ORDER — SODIUM CHLORIDE 0.9 % IV SOLN: 250.0000 mL | INTRAVENOUS | Status: DC | PRN

## 2019-02-13 MED ORDER — ACETAMINOPHEN 325 MG PO TABS
650.0000 mg | ORAL_TABLET | ORAL | Status: DC | PRN
  Administered 2019-02-14 – 2019-02-16 (×3): 650 mg via ORAL
  Filled 2019-02-13 (×3): qty 2

## 2019-02-13 MED ORDER — LIDOCAINE 5 % EX PTCH
1.0000 | MEDICATED_PATCH | CUTANEOUS | Status: DC
  Administered 2019-02-13 – 2019-02-15 (×2): 1 via TRANSDERMAL
  Filled 2019-02-13 (×5): qty 1

## 2019-02-13 MED ORDER — FLUTICASONE FUROATE-VILANTEROL 100-25 MCG/INH IN AEPB
1.0000 | INHALATION_SPRAY | Freq: Every day | RESPIRATORY_TRACT | Status: DC
  Administered 2019-02-14 – 2019-02-16 (×3): 1 via RESPIRATORY_TRACT
  Filled 2019-02-13 (×2): qty 28

## 2019-02-13 MED ORDER — VITAMIN B-12 1000 MCG PO TABS
500.0000 ug | ORAL_TABLET | Freq: Every day | ORAL | Status: DC
  Administered 2019-02-14 – 2019-02-16 (×3): 500 ug via ORAL
  Filled 2019-02-13: qty 0.5
  Filled 2019-02-13 (×2): qty 1

## 2019-02-13 MED ORDER — ALBUTEROL SULFATE (2.5 MG/3ML) 0.083% IN NEBU: 2.5000 mg | INHALATION_SOLUTION | Freq: Four times a day (QID) | RESPIRATORY_TRACT | Status: DC | PRN

## 2019-02-13 MED ORDER — ONDANSETRON HCL 4 MG/2ML IJ SOLN
4.0000 mg | Freq: Four times a day (QID) | INTRAMUSCULAR | Status: DC | PRN
  Administered 2019-02-15 – 2019-02-16 (×2): 4 mg via INTRAVENOUS
  Filled 2019-02-13 (×2): qty 2

## 2019-02-13 MED ORDER — SODIUM CHLORIDE 0.9% FLUSH
3.0000 mL | Freq: Two times a day (BID) | INTRAVENOUS | Status: DC
  Administered 2019-02-13 – 2019-02-16 (×5): 3 mL via INTRAVENOUS

## 2019-02-13 MED ORDER — FLUTICASONE-UMECLIDIN-VILANT 100-62.5-25 MCG/INH IN AEPB: 1.0000 | INHALATION_SPRAY | Freq: Every day | RESPIRATORY_TRACT | Status: DC

## 2019-02-13 MED ORDER — ATORVASTATIN CALCIUM 10 MG PO TABS
10.0000 mg | ORAL_TABLET | Freq: Every day | ORAL | Status: DC
  Administered 2019-02-13 – 2019-02-16 (×4): 10 mg via ORAL
  Filled 2019-02-13 (×4): qty 1

## 2019-02-13 MED ORDER — FUROSEMIDE 10 MG/ML IJ SOLN
40.0000 mg | Freq: Once | INTRAMUSCULAR | Status: AC
  Administered 2019-02-13: 40 mg via INTRAVENOUS
  Filled 2019-02-13: qty 4

## 2019-02-13 MED ORDER — FERROUS SULFATE 325 (65 FE) MG PO TABS
325.0000 mg | ORAL_TABLET | Freq: Every day | ORAL | Status: DC
  Administered 2019-02-14 – 2019-02-16 (×3): 325 mg via ORAL
  Filled 2019-02-13 (×3): qty 1

## 2019-02-13 MED ORDER — INSULIN PUMP
Freq: Three times a day (TID) | SUBCUTANEOUS | Status: DC
  Administered 2019-02-13 – 2019-02-14 (×2): via SUBCUTANEOUS
  Administered 2019-02-14: 12:00:00 5 via SUBCUTANEOUS
  Administered 2019-02-15: 6 via SUBCUTANEOUS
  Administered 2019-02-15: 4 via SUBCUTANEOUS
  Administered 2019-02-15: 6 via SUBCUTANEOUS
  Administered 2019-02-16: 10:00:00 1 via SUBCUTANEOUS
  Filled 2019-02-13: qty 1

## 2019-02-13 MED ORDER — ACETAMINOPHEN 500 MG PO TABS
1000.0000 mg | ORAL_TABLET | Freq: Once | ORAL | Status: AC
  Administered 2019-02-13: 1000 mg via ORAL
  Filled 2019-02-13: qty 2

## 2019-02-13 MED ORDER — FUROSEMIDE 10 MG/ML IJ SOLN
40.0000 mg | Freq: Once | INTRAMUSCULAR | Status: AC
  Administered 2019-02-14: 08:00:00 40 mg via INTRAVENOUS
  Filled 2019-02-13: qty 4

## 2019-02-13 MED ORDER — ENOXAPARIN SODIUM 40 MG/0.4ML ~~LOC~~ SOLN
30.0000 mg | SUBCUTANEOUS | Status: DC
  Administered 2019-02-13: 30 mg via SUBCUTANEOUS
  Filled 2019-02-13: qty 0.4

## 2019-02-13 MED ORDER — SODIUM CHLORIDE 0.9% FLUSH: 3.0000 mL | INTRAVENOUS | Status: DC | PRN

## 2019-02-13 NOTE — ED Triage Notes (Addendum)
Patient c/o low back pain x 1 week. Denies injury. States she is having some urinary frequency.  States she has an appointment with her urologist on Dec 17th, but the pain is unbearable today.  She also reports an increase in her shortness of breath and bilateral LE edema over several days.

## 2019-02-13 NOTE — ED Triage Notes (Signed)
Patient sent by PCP Max Sane PA for CHF exacerbation, ne wonset bradycardia, increased SOB for a couple of days, and lower back pain X3 weeks that is getting worse. Wears 3L O2 continuously. A&O x4 in triage

## 2019-02-13 NOTE — ED Provider Notes (Signed)
[facility] Emergency Department Provider Note   ____________________________________________   First MD Initiated Contact with Patient 02/13/19 1750     (approximate)  I have reviewed the triage vital signs and the nursing notes.   HISTORY  Chief Complaint Bradycardia, Shortness of Breath, and Back Pain    HPI Karen [name] is a 73 y.o. female with past medical history of CHF, COPD on 3 L nasal cannula, CKD, and diabetes who presents to the ED complaining of back pain and shortness of breath.  Patient reports that she initially presented to an urgent care earlier today for 3 days of worsening bilateral low back pain.  During that visit, she also admitted to feeling increasingly short of breath over the past few days.  There was concern for CHF exacerbation given she also has lower extremity swelling, she was also noted to be bradycardic and was referred to the ED for further evaluation.  Patient denies any recent changes to her medications, is unsure whether she has continuing to take a beta-blocker.  She denies any dysuria, hematuria, lower extremity numbness or weakness.  She has not had any difficulty urinating.  She has not had any recent fevers, cough, chest pain.        Past Medical History:  Diagnosis Date  . (HFpEF) heart failure with preserved ejection fraction ([place]) 2017   (1) TTE 2017 a. EF 50% b. mild LVH c. mild MR/TR, d. mild LAE e. mild pulmonary HTN  (2) TTE 06/2018 a. EF 50-55%, mild AS with thickening of valve   . Acute on chronic respiratory failure with hypoxia and hypercapnia ([place]) 01/07/2015  . Anemia   . 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 Asthma   . Cataract   . CHF (congestive heart failure) ([place])   . Chronic kidney disease 08/10/2017  . CKD (chronic kidney disease)   . COPD (chronic obstructive pulmonary disease) ([place])    (1) 06/2018 tobacco use, home 3L oxygen   . Diabetes mellitus without complication (HCC)    (1) A1C 7.7 (06/2018)  .  Edema, peripheral 04/20/2014  . History of kidney stones   . Hyperlipidemia   . Hypertension   . Iron deficiency anemia 06/22/2014  . Leucocytosis 10/19/2015  . Overactive bladder   . Primary osteoarthritis of right knee 09/01/2016  . Renal insufficiency   . Sciatica 01/07/2015    Patient Active Problem List   Diagnosis Date Noted  . AVM (arteriovenous malformation) of small bowel, acquired   . Acute gastric ulcer with hemorrhage   . Chronic diastolic heart failure ([place]) 08/22/2018  . Diarrhea 08/22/2018  . Junctional bradycardia   . Acute on chronic heart failure with preserved ejection fraction (HFpEF) ([place])   . AKI (acute kidney injury) ([place])   . Symptomatic bradycardia 08/05/2018  . Acute on chronic respiratory failure ([place]) 06/25/2018  . 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 diabetes mellitus ([place]) 01/25/2018  . History of non anemic vitamin B12 deficiency 01/25/2018  . Personal history of kidney stones 11/12/2017  . Urge incontinence 11/12/2017  . Right ureteral stone 09/15/2017  . Acute GI bleeding   . GI bleed 08/26/2017  . Arthritis 08/10/2017  . Chronic kidney disease 08/10/2017  . COPD (chronic obstructive pulmonary disease) ([place]) 08/10/2017  . Diabetes mellitus type 2, uncomplicated ([place]) 0000000  . Hypertension 08/10/2017  . Obesity (BMI 35.0-39.9 without comorbidity) 04/11/2017  . Primary osteoarthritis of right knee 09/01/2016  . Leucocytosis 10/19/2015  . 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  Acute on chronic respiratory failure with hypoxia and hypercapnia ([place]) 01/07/2015  . Sciatica 01/07/2015  . Weakness 01/07/2015  . 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 Iron deficiency anemia 06/22/2014  . Microalbuminuria 06/22/2014  . CHF (congestive heart failure) ([place]) 04/20/2014  . Edema, peripheral 04/20/2014    Past Surgical History:  Procedure Laterality Date  . APPENDECTOMY    . CESAREAN SECTION     x3  .  CHOLECYSTECTOMY    . COLONOSCOPY WITH PROPOFOL N/A 08/28/2017   Procedure: COLONOSCOPY WITH PROPOFOL;  Surgeon: Lucilla Lame, MD;  Location: [facility] ENDOSCOPY;  Service: Endoscopy;  Laterality: N/A;  . COLONOSCOPY WITH PROPOFOL N/A 08/29/2017   Procedure: COLONOSCOPY WITH PROPOFOL;  Surgeon: Lucilla Lame, MD;  Location: [facility] ENDOSCOPY;  Service: Endoscopy;  Laterality: N/A;  . CYSTOSCOPY W/ URETERAL STENT PLACEMENT Right 09/15/2017   Procedure: CYSTOSCOPY WITH RETROGRADE PYELOGRAM/URETERAL STENT PLACEMENT;  Surgeon: Cleon Gustin, MD;  Location: [facility] ORS;  Service: Urology;  Laterality: Right;  . CYSTOSCOPY/URETEROSCOPY/HOLMIUM LASER/STENT PLACEMENT Right 10/09/2017   Procedure: CYSTOSCOPY/URETEROSCOPY/HOLMIUM LASER/STENT PLACEMENT;  Surgeon: Abbie Sons, MD;  Location: [facility] ORS;  Service: Urology;  Laterality: Right;  right Stent exchange  . ESOPHAGOGASTRODUODENOSCOPY (EGD) WITH PROPOFOL N/A 09/16/2018   Procedure: ESOPHAGOGASTRODUODENOSCOPY (EGD) WITH PROPOFOL;  Surgeon: Lin Landsman, MD;  Location: [place];  Service: Gastroenterology;  Laterality: N/A;  . EYE SURGERY      Prior to Admission medications   Medication Sig Start Date End Date Taking? Authorizing Provider  atorvastatin (LIPITOR) 10 MG tablet Take 1 tablet by mouth daily.   Yes [provider]  bisoprolol (ZEBETA) 5 MG tablet Take 5 mg by mouth daily. 12/02/18  Yes [provider]  canagliflozin (INVOKANA) 100 MG TABS tablet Take 1 tablet by mouth daily.   Yes [provider]  esomeprazole (NEXIUM) 40 MG capsule Take 40 mg by mouth daily. 12/17/18  Yes [provider]  Ferrous Sulfate (IRON) 325 (65 Fe) MG TABS Take 1 tablet by mouth daily. 09/14/17  Yes [provider]  fluticasone (FLONASE) 50 MCG/ACT nasal spray Place 2 sprays into both nostrils daily. 11/15/18  Yes [provider]  Fluticasone-Umeclidin-Vilant 100-62.5-25 MCG/INH AEPB Inhale 1 puff into the  lungs daily. 12/25/17  Yes [provider]  furosemide (LASIX) 40 MG tablet Take 1 tablet (40 mg total) by mouth 2 (two) times daily. 08/29/18  Yes Gollan, Kathlene November, MD  Insulin Human (INSULIN PUMP) SOLN Inject 1 each into the skin 3 times daily with meals, bedtime and 2 AM. Patient taking differently: Inject 1 each into the skin 3 times daily with meals, bedtime and 2 AM. Humalog Insulin 76 units daily 09/18/17  Yes Dustin Flock, MD  ipratropium-albuterol (DUONEB) 0.5-2.5 (3) MG/3ML SOLN Inhale 3 mLs into the lungs 4 (four) times daily. 02/05/19  Yes [provider]  losartan-hydrochlorothiazide (HYZAAR) 100-25 MG tablet Take 1 tablet by mouth daily. 01/05/19  Yes [provider]  metFORMIN (GLUCOPHAGE) 500 MG tablet Take 1,000 mg by mouth 2 (two) times daily with a meal.    Yes [provider]  montelukast (SINGULAIR) 10 MG tablet Take 10 mg by mouth at bedtime.   Yes [provider]  solifenacin (VESICARE) 10 MG tablet Take 1 tablet (10 mg total) by mouth daily. 07/23/18  Yes Stoioff, Ronda Fairly, MD  vitamin B-12 (CYANOCOBALAMIN) 500 MCG tablet Take 500 mcg by mouth daily.   Yes [provider]  albuterol (VENTOLIN HFA) 108 (  90 Base) MCG/ACT inhaler INHALE 2 PUFFS BY MOUTH EVERY 6 HOURS AS NEEDED FOR WHEEZING 01/02/19   [provider]  cyclobenzaprine (FLEXERIL) 10 MG tablet Take 1 tablet (10 mg total) by mouth 3 (three) times daily as needed for muscle spasms. Patient not taking: Reported on 02/13/2019 08/02/18   Norval Gable, MD    Allergies Ace inhibitors, Gabapentin, Lisinopril, Lyrica [pregabalin], and Shrimp [shellfish allergy]  Family History  Problem Relation Age of Onset  . Other Mother        unknown medical history  . Other Father        unknown medical history    Social History Social History   Tobacco Use  . Smoking status: Former Smoker    Packs/day: 1.00    Years: 20.00    Pack years: 20.00    Quit date:  12/04/1992    Years since quitting: 26.2  . Smokeless tobacco: Never Used  Substance Use Topics  . Alcohol use: No  . Drug use: No    Review of Systems  Constitutional: No fever/chills Eyes: No visual changes. ENT: No sore throat. Cardiovascular: Denies chest pain. Respiratory: For shortness of breath. Gastrointestinal: No abdominal pain.  No nausea, no vomiting.  No diarrhea.  No constipation. Genitourinary: Negative for dysuria. Musculoskeletal: Positive for back pain. Skin: Negative for rash. Neurological: Negative for headaches, focal weakness or numbness.  ____________________________________________   PHYSICAL EXAM:  VITAL SIGNS: ED Triage Vitals  Enc Vitals Group     BP 02/13/19 1610 (!) 119/45     Pulse Rate 02/13/19 1610 (!) 43     Resp 02/13/19 1610 18     Temp 02/13/19 1610 98.1 F (36.7 C)     Temp Source 02/13/19 1610 Oral     SpO2 02/13/19 1610 99 %     Weight 02/13/19 1611 220 lb (99.8 kg)     Height 02/13/19 1611 5\' 3"  (1.6 m)     Head Circumference --      Peak Flow --      Pain Score 02/13/19 1611 10     Pain Loc --      Pain Edu? --      Excl. in [place]? --     Constitutional: Alert and oriented. Eyes: Conjunctivae are normal. Head: Atraumatic. Nose: No congestion/rhinnorhea. Mouth/Throat: Mucous membranes are moist. Neck: Normal ROM Cardiovascular: Bradycardic, regular rhythm. Grossly normal heart sounds. Respiratory: Normal respiratory effort.  No retractions. Lungs CTAB. Gastrointestinal: Soft and nontender. No distention.  No CVA tenderness bilaterally.  Bilateral lumbar paraspinal tenderness to palpation. Genitourinary: deferred Musculoskeletal: No lower extremity tenderness.  2+ pitting edema to knees bilaterally. Neurologic:  Normal speech and language. No gross focal neurologic deficits are appreciated. Skin:  Skin is warm, dry and intact. No rash noted. Psychiatric: Mood and affect are normal. Speech and behavior are  normal.  ____________________________________________   LABS (all labs ordered are listed, but only abnormal results are displayed)  Labs Reviewed  BASIC METABOLIC PANEL - Abnormal; Notable for the following components:      Result Value   Potassium 5.7 (*)    Glucose, Bld 280 (*)    BUN 72 (*)    Creatinine, Ser 2.25 (*)    GFR calc non Af Amer 21 (*)    GFR calc Af Amer 24 (*)    All other components within normal limits  CBC - Abnormal; Notable for the following components:   WBC 13.1 (*)    RBC 3.59 (*)  Hemoglobin 10.3 (*)    HCT 31.6 (*)    RDW 15.8 (*)    All other components within normal limits  SARS CORONAVIRUS 2 (TAT 6-24 HRS)  URINALYSIS, COMPLETE (UACMP) WITH MICROSCOPIC  BRAIN NATRIURETIC PEPTIDE  TROPONIN I (HIGH SENSITIVITY)  TROPONIN I (HIGH SENSITIVITY)   ____________________________________________  EKG  ED ECG REPORT I, Blake Divine, the attending physician, personally viewed and interpreted this ECG.   Date: 02/13/2019  EKG Time: 16:02  Rate: 45  Rhythm: Junctional bradycardia  Axis: Normal  Intervals:none  ST&T Change: None  ED ECG REPORT I, Blake Divine, the attending physician, personally viewed and interpreted this ECG.   Date: 02/13/2019  EKG Time: 18:29  Rate: 40  Rhythm: Junctional bradycardia  Axis: Normal  Intervals:none  ST&T Change: None   PROCEDURES  Procedure(s) performed (including Critical Care):  Procedures   ____________________________________________   INITIAL IMPRESSION / ASSESSMENT AND PLAN / ED COURSE       73 year old female with history of COPD on 3 L, COPD, CKD, and CHF who presents to the ED for acute on chronic low back pain as well as worsening difficulty breathing, noted to be bradycardic at urgent care.  She is alert and oriented upon arrival to the ED with no change in her mental status, blood pressure noted to be stable.  EKG shows junctional bradycardia with retrograde P waves, no acute  ischemic changes.  Her med list shows beta-blockers and she apparently had a similar episode back in June, when she required a dopamine infusion.  She improved when beta-blocker was held at that time and it is unclear how this ended up back on her med list.  Case was discussed with Dr. Rockey Situ of cardiology, who recommends holding beta-blocker for now and continue to monitor.  She also appears fluid overloaded with chest x-ray consistent with CHF, will gently diurese but will need close monitoring given her AKI.  Case discussed with hospitalist, who accepts patient for admission.      ____________________________________________   FINAL CLINICAL IMPRESSION(S) / ED DIAGNOSES  Final diagnoses:  Junctional bradycardia  Acute on chronic congestive heart failure, unspecified heart failure type (HCC)  Chronic bilateral low back pain without sciatica     ED Discharge Orders    None       Note:  This document was prepared using Dragon voice recognition software and may include unintentional dictation errors.   Blake Divine, MD 02/13/19 [phone]

## 2019-02-13 NOTE — ED Notes (Signed)
Pt moved to hospital bed and repositioned, belongings placed in bag, purewick external catheter placed.

## 2019-02-13 NOTE — ED Triage Notes (Signed)
First nurse: Durene Cal by Max Sane due to Acute CHF exacerbation, bradycardia, increased SOB, and back pain

## 2019-02-13 NOTE — ED Provider Notes (Signed)
[facility] ____________________________________________  Time seen: Approximately 3:43 PM  I have reviewed the triage vital signs and the nursing notes.   HISTORY  Chief Complaint Urinary Urgency and Back Pain   HPI Karen [name] is a 73 y.o. female has medical history of COPD, CKD, CHF on home 3 L presenting for evaluation of back pain.  Patient further reports also having bilateral lower extremity edema atypical from her baseline, increased shortness of breath as well as some urinary urgency.  No fevers.  Denies atypical cough.  Denies chest pain.  States does have some weakness, no syncope or near syncope.  Denies known sick contacts.  Denies aggravating alleviating factors.  Ricardo Jericho, NP: PCP   Past Medical History:  Diagnosis Date  . (HFpEF) heart failure with preserved ejection fraction ([place]) 2017   (1) TTE 2017 a. EF 50% b. mild LVH c. mild MR/TR, d. mild LAE e. mild pulmonary HTN  (2) TTE 06/2018 a. EF 50-55%, mild AS with thickening of valve   . Acute on chronic respiratory failure with hypoxia and hypercapnia ([place]) 01/07/2015  . Anemia   . 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 Asthma   . Cataract   . Chronic kidney disease 08/10/2017  . CKD (chronic kidney disease)   . COPD (chronic obstructive pulmonary disease) ([place])    (1) 06/2018 tobacco use, home 3L oxygen   . Diabetes mellitus without complication (HCC)    (1) A1C 7.7 (06/2018)  . Edema, peripheral 04/20/2014  . History of kidney stones   . Hyperlipidemia   . Hypertension   . Iron deficiency anemia 06/22/2014  . Leucocytosis 10/19/2015  . Overactive bladder   . Primary osteoarthritis of right knee 09/01/2016  . Renal insufficiency   . Sciatica 01/07/2015    Patient Active Problem List   Diagnosis Date Noted  . AVM (arteriovenous malformation) of small bowel, acquired   . Acute gastric ulcer with hemorrhage   . Chronic diastolic heart failure ([place]) 08/22/2018  . Diarrhea 08/22/2018  .  Junctional bradycardia   . Acute on chronic heart failure with preserved ejection fraction (HFpEF) ([place])   . AKI (acute kidney injury) ([place])   . Symptomatic bradycardia 08/05/2018  . Acute on chronic respiratory failure ([place]) 06/25/2018  . 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 diabetes mellitus ([place]) 01/25/2018  . History of non anemic vitamin B12 deficiency 01/25/2018  . Personal history of kidney stones 11/12/2017  . Urge incontinence 11/12/2017  . Right ureteral stone 09/15/2017  . Acute GI bleeding   . GI bleed 08/26/2017  . Arthritis 08/10/2017  . Chronic kidney disease 08/10/2017  . COPD (chronic obstructive pulmonary disease) ([place]) 08/10/2017  . Diabetes mellitus type 2, uncomplicated ([place]) 65/46/5035  . Hypertension 08/10/2017  . Obesity (BMI 35.0-39.9 without comorbidity) 04/11/2017  . Primary osteoarthritis of right knee 09/01/2016  . Leucocytosis 10/19/2015  . 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 Acute on chronic respiratory failure with hypoxia and hypercapnia ([place]) 01/07/2015  . Sciatica 01/07/2015  . Weakness 01/07/2015  . 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 Iron deficiency anemia 06/22/2014  . Microalbuminuria 06/22/2014  . CHF (congestive heart failure) ([place]) 04/20/2014  . Edema, peripheral 04/20/2014    Past Surgical History:  Procedure Laterality Date  . APPENDECTOMY    . CESAREAN SECTION     x3  . CHOLECYSTECTOMY    . COLONOSCOPY WITH PROPOFOL N/A 08/28/2017   Procedure: COLONOSCOPY WITH PROPOFOL;  Surgeon: Lucilla Lame, MD;  Location: [facility] ENDOSCOPY;  Service: Endoscopy;  Laterality: N/A;  . COLONOSCOPY WITH PROPOFOL N/A 08/29/2017   Procedure: COLONOSCOPY WITH PROPOFOL;  Surgeon: Lucilla Lame, MD;  Location: [facility] ENDOSCOPY;  Service: Endoscopy;  Laterality: N/A;  . CYSTOSCOPY W/ URETERAL STENT PLACEMENT Right 09/15/2017   Procedure: CYSTOSCOPY WITH RETROGRADE PYELOGRAM/URETERAL STENT PLACEMENT;  Surgeon: Cleon Gustin, MD;  Location: [facility] ORS;  Service: Urology;  Laterality: Right;  . CYSTOSCOPY/URETEROSCOPY/HOLMIUM LASER/STENT PLACEMENT Right 10/09/2017   Procedure: CYSTOSCOPY/URETEROSCOPY/HOLMIUM LASER/STENT PLACEMENT;  Surgeon: Abbie Sons, MD;  Location: [facility] ORS;  Service: Urology;  Laterality: Right;  right Stent exchange  . ESOPHAGOGASTRODUODENOSCOPY (EGD) WITH PROPOFOL N/A 09/16/2018   Procedure: ESOPHAGOGASTRODUODENOSCOPY (EGD) WITH PROPOFOL;  Surgeon: Lin Landsman, MD;  Location: [place];  Service: Gastroenterology;  Laterality: N/A;  . EYE SURGERY       No current facility-administered medications for this encounter.  Current Outpatient Medications:  .  albuterol (VENTOLIN HFA) 108 (90 Base) MCG/ACT inhaler, INHALE 2 PUFFS BY MOUTH EVERY 6 HOURS AS NEEDED FOR WHEEZING, Disp: , Rfl:  .  atorvastatin (LIPITOR) 10 MG tablet, Take 1 tablet by mouth daily., Disp: , Rfl:  .  bisoprolol (ZEBETA) 5 MG tablet, Take 5 mg by mouth daily., Disp: , Rfl:  .  canagliflozin (INVOKANA) 100 MG TABS tablet, Take 1 tablet by mouth daily., Disp: , Rfl:  .  cyclobenzaprine (FLEXERIL) 10 MG tablet, Take 1 tablet (10 mg total) by mouth 3 (three) times daily as needed for muscle spasms., Disp: 30 tablet, Rfl: 0 .  esomeprazole (NEXIUM) 40 MG capsule, Take 40 mg by mouth daily., Disp: , Rfl:  .  Ferrous Sulfate (IRON) 325 (65 Fe) MG TABS, Take 1 tablet by mouth daily., Disp: , Rfl: 11 .  fluticasone (FLONASE) 50 MCG/ACT nasal spray, Place 2 sprays into both nostrils daily., Disp: , Rfl:  .  fluticasone furoate-vilanterol (BREO ELLIPTA) 100-25 MCG/INH AEPB, Inhale 1 puff into the lungs daily., Disp: , Rfl:  .  Fluticasone-Umeclidin-Vilant 100-62.5-25 MCG/INH AEPB, Inhale 1 puff into the lungs daily., Disp: , Rfl:  .  furosemide (LASIX) 40 MG tablet, Take 1 tablet (40 mg total) by mouth 2 (two) times daily., Disp: 60 tablet, Rfl: 5 .  Insulin Disposable Pump (V-GO 40) KIT, Inject into the skin., Disp: ,  Rfl:  .  Insulin Human (INSULIN PUMP) SOLN, Inject 1 each into the skin 3 times daily with meals, bedtime and 2 AM. (Patient taking differently: Inject 1 each into the skin 3 times daily with meals, bedtime and 2 AM. Humalog Insulin 76 units daily), Disp: , Rfl:  .  insulin lispro (HUMALOG) 100 UNIT/ML injection, SMARTSIG:0-76 Unit(s) SUB-Q Daily, Disp: , Rfl:  .  losartan (COZAAR) 100 MG tablet, Take 100 mg by mouth daily., Disp: , Rfl: 11 .  losartan-hydrochlorothiazide (HYZAAR) 100-25 MG tablet, Take 1 tablet by mouth daily., Disp: , Rfl:  .  metFORMIN (GLUCOPHAGE) 500 MG tablet, Take 1,000 mg by mouth 2 (two) times daily with a meal. , Disp: , Rfl:  .  montelukast (SINGULAIR) 10 MG tablet, Take 10 mg by mouth at bedtime., Disp: , Rfl:  .  solifenacin (VESICARE) 10 MG tablet, Take 1 tablet (10 mg total) by mouth daily., Disp: 30 tablet, Rfl: 5 .  vitamin B-12 (CYANOCOBALAMIN) 500 MCG tablet, Take 500 mcg by mouth daily., Disp: , Rfl:   Allergies Ace inhibitors, Gabapentin, Lisinopril, Lyrica [pregabalin], and Shrimp [shellfish allergy]  Family History  Problem Relation Age of Onset  .  Other Mother        unknown medical history  . Other Father        unknown medical history    Social History Social History   Tobacco Use  . Smoking status: Former Smoker    Packs/day: 1.00    Years: 20.00    Pack years: 20.00    Quit date: 12/04/1992    Years since quitting: 26.2  . Smokeless tobacco: Never Used  Substance Use Topics  . Alcohol use: No  . Drug use: No    Review of Systems Constitutional: No fever ENT: No sore throat. Cardiovascular: Denies chest pain. Respiratory: As above.  Gastrointestinal: No abdominal pain.  Genitourinary: Positive for dysuria. Musculoskeletal: Positive for back pain. Skin: Negative for rash.   ____________________________________________   PHYSICAL EXAM:  VITAL SIGNS: ED Triage Vitals  Enc Vitals Group     BP 02/13/19 1427 (!) 132/97      Pulse Rate 02/13/19 1427 (!) 43     Resp 02/13/19 1427 20     Temp 02/13/19 1427 98.2 F (36.8 C)     Temp Source 02/13/19 1427 Oral     SpO2 02/13/19 1427 97 %     Weight 02/13/19 1423 210 lb (95.3 kg)     Height 02/13/19 1423 '5\' 3"'  (1.6 m)     Head Circumference --      Peak Flow --      Pain Score 02/13/19 1422 10     Pain Loc --      Pain Edu? --      Excl. in [place]? --     Constitutional: Alert and oriented.  Eyes: Conjunctivae are normal.  ENT      Head: Normocephalic Cardiovascular: Normal rate, regular rhythm. Grossly normal heart sounds.  Good peripheral circulation. Respiratory: Normal respiratory effort without tachypnea nor retractions.  No wheezes.  Bilateral diminished bases. Gastrointestinal: Soft and nontender.  Musculoskeletal: Bilateral lower extremity 1-2+ pitting edema. Neurologic:  Normal speech and language. No gross focal neurologic deficits are appreciated. Skin:  Skin is warm, dry and intact. No rash noted. Psychiatric: Mood and affect are normal. Speech and behavior are normal. Patient exhibits appropriate insight and judgment   ___________________________________________   LABS (all labs ordered are listed, but only abnormal results are displayed)  Labs Reviewed - No data to display ____________________________________________  EKG  ED ECG REPORT I, Marylene Land, the attending provider, personally viewed and interpreted this ECG.   Date: 02/13/2019  EKG Time: 1432  Rate: 47  Rhythm: junctional rhythm   Intervals:none  ST&T Change: none  RADIOLOGY  No results found. ____________________________________________   PROCEDURES Procedures    INITIAL IMPRESSION / ASSESSMENT AND PLAN / ED COURSE  Pertinent labs & imaging results that were available during my care of the patient were reviewed by me and considered in my medical decision making (see chart for details).  Patient presenting with her son for evaluation of back pain, urinary  urgency, extremity edema and increased with some shortness of breath.  Patient on her normal 3 L of oxygen at this time satting well.  Patient also noted to have bradycardia while in urgent care, patient is on beta-blocker.  Discussed multiple differentials with patient including CHF exacerbation, urinary infection, musculoskeletal back pain, COPD exacerbation.  Recommend further evaluation emergency room at this time.  Patient states that her son will take her directly to [facility].  Declined EMS transfer.  [facility] triage nurse called and report. ____________________________________________  FINAL CLINICAL IMPRESSION(S) / ED DIAGNOSES  Final diagnoses:  Bradycardia  Lower extremity edema  Shortness of breath     ED Discharge Orders    None       Note: This dictation was prepared with Dragon dictation along with smaller phrase technology. Any transcriptional errors that result from this process are unintentional.         Marylene Land, NP 02/13/19 1619

## 2019-02-13 NOTE — Discharge Instructions (Addendum)
Go directly to emergency room now.

## 2019-02-13 NOTE — H&P (Signed)
History and Physical        Hospital Admission Note Date: 02/13/2019  Patient name: Karen [name] Medical record number: [id number] Date of birth: 02-24-1946 Age: 73 y.o. Gender: female  PCP: Ricardo Jericho, NP    Patient coming from: Urgent Care   I have reviewed all records in the [facility].    Chief Complaint:  Shortness of Breath   HPI: Karen [name] is 73 y.o. female with PMH of CHF, COPD on 3L [place], T2DM who presents to ED for SOB. Was seen at urgent care earlier today for acute on chronic lower back pain. During that visit, she also admitted to feeling increasingly short of breath over the past few days.  There was concern for CHF exacerbation given she also has lower extremity swelling, she was also noted to be bradycardic and was referred to the ED for further evaluation.    ED work-up/course:   EKG shows junctional bradycardia with retrograde P waves, no acute ischemic changes.  Her med list shows beta-blockers and she apparently had a similar episode back in June, when she required a dopamine infusion.  She improved when beta-blocker was held at that time and it is unclear how this ended up back on her med list.  Case was discussed with Dr. Rockey Situ of cardiology, who recommends holding beta-blocker for now and continue to monitor.  She also appears fluid overloaded with chest x-ray consistent with CHF, will gently diurese but will need close monitoring given her AKI.   Review of Systems: Positives marked in 'bold' Constitutional: Denies fever, chills, diaphoresis, poor appetite and fatigue.  HEENT: Denies photophobia, eye pain, redness, hearing loss, ear pain, congestion, sore throat, rhinorrhea, sneezing, mouth sores, trouble swallowing, neck pain, neck stiffness and tinnitus.   Respiratory: Denies SOB, DOE, cough, chest tightness,  and wheezing.   Cardiovascular: Denies chest pain, palpitations  and leg swelling.  Gastrointestinal: Denies nausea, vomiting, abdominal pain, diarrhea, constipation, blood in stool and abdominal distention.  Genitourinary: Denies dysuria, urgency, frequency, hematuria, flank pain and difficulty urinating.  Musculoskeletal: Denies myalgias, back pain, joint swelling, arthralgias and gait problem.  Skin: Denies pallor, rash and wound.  Neurological: Denies dizziness, seizures, syncope, weakness, light-headedness, numbness and headaches.  Hematological: Denies adenopathy. Easy bruising, personal or family bleeding history  Psychiatric/Behavioral: Denies suicidal ideation, mood changes, confusion, nervousness, sleep disturbance and agitation  Past Medical History: Past Medical History:  Diagnosis Date  . (HFpEF) heart failure with preserved ejection fraction ([place]) 2017   (1) TTE 2017 a. EF 50% b. mild LVH c. mild MR/TR, d. mild LAE e. mild pulmonary HTN  (2) TTE 06/2018 a. EF 50-55%, mild AS with thickening of valve   . Acute on chronic respiratory failure with hypoxia and hypercapnia ([place]) 01/07/2015  . Anemia   . 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 Asthma   . Cataract   . CHF (congestive heart failure) ([place])   . Chronic kidney disease 08/10/2017  . CKD (chronic kidney disease)   . COPD (chronic obstructive pulmonary disease) ([place])    (1) 06/2018 tobacco use, home 3L oxygen   . Diabetes mellitus without complication ([place])    (1)  A1C 7.7 (06/2018)  . Edema, peripheral 04/20/2014  . History of kidney stones   . Hyperlipidemia   . Hypertension   . Iron deficiency anemia 06/22/2014  . Leucocytosis 10/19/2015  . Overactive bladder   . Primary osteoarthritis of right knee 09/01/2016  . Renal insufficiency   . Sciatica 01/07/2015    Past Surgical History:  Procedure Laterality Date  . APPENDECTOMY    . CESAREAN SECTION     x3  . CHOLECYSTECTOMY    . COLONOSCOPY WITH PROPOFOL N/A 08/28/2017   Procedure: COLONOSCOPY WITH PROPOFOL;  Surgeon: Lucilla Lame, MD;  Location:  [facility] ENDOSCOPY;  Service: Endoscopy;  Laterality: N/A;  . COLONOSCOPY WITH PROPOFOL N/A 08/29/2017   Procedure: COLONOSCOPY WITH PROPOFOL;  Surgeon: Lucilla Lame, MD;  Location: [facility] ENDOSCOPY;  Service: Endoscopy;  Laterality: N/A;  . CYSTOSCOPY W/ URETERAL STENT PLACEMENT Right 09/15/2017   Procedure: CYSTOSCOPY WITH RETROGRADE PYELOGRAM/URETERAL STENT PLACEMENT;  Surgeon: Cleon Gustin, MD;  Location: [facility] ORS;  Service: Urology;  Laterality: Right;  . CYSTOSCOPY/URETEROSCOPY/HOLMIUM LASER/STENT PLACEMENT Right 10/09/2017   Procedure: CYSTOSCOPY/URETEROSCOPY/HOLMIUM LASER/STENT PLACEMENT;  Surgeon: Abbie Sons, MD;  Location: [facility] ORS;  Service: Urology;  Laterality: Right;  right Stent exchange  . ESOPHAGOGASTRODUODENOSCOPY (EGD) WITH PROPOFOL N/A 09/16/2018   Procedure: ESOPHAGOGASTRODUODENOSCOPY (EGD) WITH PROPOFOL;  Surgeon: Lin Landsman, MD;  Location: [place];  Service: Gastroenterology;  Laterality: N/A;  . EYE SURGERY      Medications: Prior to Admission medications   Medication Sig Start Date End Date Taking? Authorizing Provider  atorvastatin (LIPITOR) 10 MG tablet Take 1 tablet by mouth daily.   Yes [provider]  bisoprolol (ZEBETA) 5 MG tablet Take 5 mg by mouth daily. 12/02/18  Yes [provider]  canagliflozin (INVOKANA) 100 MG TABS tablet Take 1 tablet by mouth daily.   Yes [provider]  esomeprazole (NEXIUM) 40 MG capsule Take 40 mg by mouth daily. 12/17/18  Yes [provider]  Ferrous Sulfate (IRON) 325 (65 Fe) MG TABS Take 1 tablet by mouth daily. 09/14/17  Yes [provider]  fluticasone (FLONASE) 50 MCG/ACT nasal spray Place 2 sprays into both nostrils daily. 11/15/18  Yes [provider]  Fluticasone-Umeclidin-Vilant 100-62.5-25 MCG/INH AEPB Inhale 1 puff into the lungs daily. 12/25/17  Yes [provider]  furosemide (LASIX) 40 MG tablet Take 1 tablet (40 mg total) by mouth 2 (two)  times daily. 08/29/18  Yes Gollan, Kathlene November, MD  Insulin Human (INSULIN PUMP) SOLN Inject 1 each into the skin 3 times daily with meals, bedtime and 2 AM. Patient taking differently: Inject 1 each into the skin 3 times daily with meals, bedtime and 2 AM. Humalog Insulin 76 units daily 09/18/17  Yes Dustin Flock, MD  ipratropium-albuterol (DUONEB) 0.5-2.5 (3) MG/3ML SOLN Inhale 3 mLs into the lungs 4 (four) times daily. 02/05/19  Yes [provider]  losartan-hydrochlorothiazide (HYZAAR) 100-25 MG tablet Take 1 tablet by mouth daily. 01/05/19  Yes [provider]  metFORMIN (GLUCOPHAGE) 500 MG tablet Take 1,000 mg by mouth 2 (two) times daily with a meal.    Yes [provider]  montelukast (SINGULAIR) 10 MG tablet Take 10 mg by mouth at bedtime.   Yes [provider]  solifenacin (VESICARE) 10 MG tablet Take 1 tablet (10 mg total) by mouth daily. 07/23/18  Yes Stoioff, Ronda Fairly, MD  vitamin B-12 (CYANOCOBALAMIN) 500 MCG tablet Take 500 mcg by mouth daily.   Yes [provider]  albuterol (  VENTOLIN HFA) 108 (90 Base) MCG/ACT inhaler INHALE 2 PUFFS BY MOUTH EVERY 6 HOURS AS NEEDED FOR WHEEZING 01/02/19   [provider]  cyclobenzaprine (FLEXERIL) 10 MG tablet Take 1 tablet (10 mg total) by mouth 3 (three) times daily as needed for muscle spasms. Patient not taking: Reported on 02/13/2019 08/02/18   Norval Gable, MD    Allergies:   Allergies  Allergen Reactions  . Ace Inhibitors Hives  . Gabapentin Hives  . Lisinopril Hives  . Lyrica [Pregabalin] Hives  . Shrimp [Shellfish Allergy] Swelling    Swelling of the lips    Social History:  reports that she quit smoking about 26 years ago. She has a 20.00 pack-year smoking history. She has never used smokeless tobacco. She reports that she does not drink alcohol or use drugs.  Family History: Family History  Problem Relation Age of Onset  . Other Mother        unknown medical history  .  Other Father        unknown medical history    Physical Exam: Blood pressure (!) 116/49, pulse (!) 43, temperature 98.1 F (36.7 C), temperature source Oral, resp. rate 11, height 5\' 3"  (1.6 m), weight 99.8 kg, SpO2 99 %. General: Alert, awake, oriented x3, in no acute distress. Eyes: pink conjunctiva,anicteric sclera, pupils equal and reactive to light and accomodation, HEENT: normocephalic, atraumatic, oropharynx clear Neck: supple, no masses or lymphadenopathy, no goiter, no bruits, no JVD CVS: Regular rate and rhythm, without murmurs, rubs or gallops. 2+ pitting edema to LE bilaterally.  Resp : Faint crackles at bases bilaterally. Expiratory wheezes throughout all lung fields. Normal WOB. No retractions noted.  GI : Soft, nontender, nondistended, positive bowel sounds, no masses. No hepatomegaly. No hernia.  Musculoskeletal: No clubbing or cyanosis, positive pedal pulses. No contracture. ROM intact  Neuro: Grossly intact, no focal neurological deficits, strength 5/5 upper and lower extremities bilaterally Psych: alert and oriented x 3, normal mood and affect Skin: no rashes or lesions, warm and dry   LABS on Admission: I have personally reviewed all the labs and imagings below    Basic Metabolic Panel: Recent Labs  Lab 02/13/19 1619  NA 136  K 5.7*  CL 99  CO2 24  GLUCOSE 280*  BUN 72*  CREATININE 2.25*  CALCIUM 8.9   Liver Function Tests: No results for input(s): AST, ALT, ALKPHOS, BILITOT, PROT, ALBUMIN in the last 168 hours. No results for input(s): LIPASE, AMYLASE in the last 168 hours. No results for input(s): AMMONIA in the last 168 hours. CBC: Recent Labs  Lab 02/13/19 1619  WBC 13.1*  HGB 10.3*  HCT 31.6*  MCV 88.0  PLT 245   Cardiac Enzymes: No results for input(s): CKTOTAL, CKMB, CKMBINDEX, TROPONINI in the last 168 hours. BNP: Invalid input(s): POCBNP CBG: No results for input(s): GLUCAP in the last 168 hours.  Radiological Exams on Admission:   DG Chest 2 View  Result Date: 02/13/2019 CLINICAL DATA:  Shortness of breath, history of CHF and COPD EXAM: CHEST - 2 VIEW COMPARISON:  Radiograph 08/05/2018, CT 06/23/2016 FINDINGS: Chronic lung hyperinflation with flattening of the diaphragms. There are basilar predominant hazy interstitial opacities with fissural and septal thickening. Suspect trace bilateral effusions. Mild cardiomegaly is similar to comparison portable radiograph. The aorta is calcified. No pneumothorax. No acute osseous or soft tissue abnormality. Degenerative changes are present in the imaged spine and shoulders. IMPRESSION: Features suggestive of CHF with interstitial edema, cardiomegaly and probable trace bilateral  effusions. Aortic Atherosclerosis (ICD10-I70.0). Electronically Signed   By: Lovena Le M.D.   On: 02/13/2019 16:42      EKG: Independently reviewed. Junctional bradycardia present.    Assessment/Plan Active Problems:   COPD (chronic obstructive pulmonary disease) (HCC)   Diabetes mellitus type 2, uncomplicated (HCC)   Hypertension   Junctional bradycardia   Acute on chronic heart failure with preserved ejection fraction (HFpEF) ([place])   AKI (acute kidney injury) ([place])   Acute heart failure ([place])  Acute on Chronic HFpEF  Patient presenting with SOB, 5lb weight gain at home (reports dry weight is 207lbs), and worsening LE edema. BNP elevated to 622. CXR with features suggestive of CHF with interstitial edema, cardiomegaly, and probable trace bilateral effusions. Last echo 6/20 with EF 50-55%. High sensitivity troponin is normal. Patient is on her home O2 at 3L . Received Lasix 40 mg IV in ED for diureses.  -admit to telemetry, continuous pulse ox  -give another dose of Lasix 40 mg IV in AM -daily weights  -strict I/Os -cardiology plans to follow  -echo ordered   Junctional Bradycardia  EKG shows junctional bradycardia with retrograde P waves, no acute ischemic changes. Patient was admitted for  same diagnosis in June. Required a dopamine infusion at that time. Beta blocker was discontinued and seems to have made its way back on patients medication list. She is currently asymptomatic denying lightheadedness/chest pain/syncope/dizziness.  -hold home beta blocker  -cardiology following  -dopamine if becomes symptomatic   AKI  Cr 2.25. Baseline 0.9-1.2. Likely related poor perfusion in setting of volume overload.  -gentle diuresis as above with close eye on kidney function  -check BMET in AM  -avoid nephrotoxic agents   COPD  Stable. While wheezing present on exam, no signs of acute exacerbation. Patient denies cough or sputum production.  -continue home inhalers   T2DM Glucose 280 at admission. Patient on insulin pump at home, she is unable to provide me with dose but pump is pre-programed. Discussed with pharmacy who will order pump and check to see if patient needs Novolog supply.   HLD  -continue statin therapy    DVT prophylaxis: Lovenox   CODE STATUS: DNR   Consults called: Cardiology called by ED provider   Family Communication: Admission, patients condition and plan of care including tests being ordered have been discussed with the patient who indicates understanding and agree with the plan and Code Status  Admission status: Inpatient   The medical decision making on this patient was of high complexity and the patient is at high risk for clinical deterioration, therefore this is a level 3 admission.  Severity of Illness:     Moderate  The appropriate patient status for this patient is INPATIENT. Inpatient status is judged to be reasonable and necessary in order to provide the required intensity of service to ensure the patient's safety. The patient's presenting symptoms, physical exam findings, and initial radiographic and laboratory data in the context of their chronic comorbidities is felt to place them at high risk for further clinical deterioration. Furthermore,  it is not anticipated that the patient will be medically stable for discharge from the hospital within 2 midnights of admission. The following factors support the patient status of inpatient.   " The patient's presenting symptoms include SOB, DOE, LE edema. " The worrisome physical exam findings include junctional bradycardia, crackles at lung bases, 2+ pitting edema. " The initial radiographic and laboratory data are worrisome because of CXR with signs of  CHF exacerbation, elevated BNP, AKI. " The chronic co-morbidities include T2DM, COPD, CHF.   * I certify that at the point of admission it is my clinical judgment that the patient will require inpatient hospital care spanning beyond 2 midnights from the point of admission due to high intensity of service, high risk for further deterioration and high frequency of surveillance required.*    Time Spent on Admission: 45 minutes      Melina Schools D.O.  [facility] 02/13/2019, 8:03 PM

## 2019-02-14 ENCOUNTER — Encounter: Payer: Self-pay | Admitting: Internal Medicine

## 2019-02-14 ENCOUNTER — Inpatient Hospital Stay
Admit: 2019-02-14 | Discharge: 2019-02-14 | Disposition: A | Discharge status: Home / Self Care | Payer: Medicare Other | Attending: Internal Medicine | Admitting: Internal Medicine

## 2019-02-14 DIAGNOSIS — N179 Acute kidney failure, unspecified: Secondary | ICD-10-CM

## 2019-02-14 DIAGNOSIS — Z794 Long term (current) use of insulin: Secondary | ICD-10-CM

## 2019-02-14 DIAGNOSIS — R001 Bradycardia, unspecified: Secondary | ICD-10-CM

## 2019-02-14 DIAGNOSIS — G8929 Other chronic pain: Secondary | ICD-10-CM

## 2019-02-14 DIAGNOSIS — N1831 Chronic kidney disease, stage 3a: Secondary | ICD-10-CM

## 2019-02-14 DIAGNOSIS — I509 Heart failure, unspecified: Secondary | ICD-10-CM

## 2019-02-14 DIAGNOSIS — E119 Type 2 diabetes mellitus without complications: Secondary | ICD-10-CM

## 2019-02-14 DIAGNOSIS — J449 Chronic obstructive pulmonary disease, unspecified: Secondary | ICD-10-CM

## 2019-02-14 DIAGNOSIS — E1165 Type 2 diabetes mellitus with hyperglycemia: Secondary | ICD-10-CM

## 2019-02-14 DIAGNOSIS — M545 Low back pain: Secondary | ICD-10-CM

## 2019-02-14 LAB — BASIC METABOLIC PANEL
Anion gap: 12 (ref 5–15)
BUN: 65 mg/dL — ABNORMAL HIGH (ref 8–23)
CO2: 27 mmol/L (ref 22–32)
Calcium: 8.9 mg/dL (ref 8.9–10.3)
Chloride: 98 mmol/L (ref 98–111)
Creatinine, Ser: 1.43 mg/dL — ABNORMAL HIGH (ref 0.44–1.00)
GFR calc Af Amer: 42 mL/min — ABNORMAL LOW (ref >60)
GFR calc non Af Amer: 36 mL/min — ABNORMAL LOW (ref >60)
Glucose, Bld: 258 mg/dL — ABNORMAL HIGH (ref 70–99)
Potassium: 4.4 mmol/L (ref 3.5–5.1)
Sodium: 137 mmol/L (ref 135–145)

## 2019-02-14 LAB — SARS CORONAVIRUS 2 (TAT 6-24 HRS): SARS Coronavirus 2: NEGATIVE

## 2019-02-14 LAB — ECHOCARDIOGRAM COMPLETE
Height: 63 in
Weight: 3520 oz

## 2019-02-14 LAB — GLUCOSE, CAPILLARY
Glucose-Capillary: 255 mg/dL — ABNORMAL HIGH (ref 70–99)
Glucose-Capillary: 264 mg/dL — ABNORMAL HIGH (ref 70–99)
Glucose-Capillary: 148 mg/dL — ABNORMAL HIGH (ref 70–99)

## 2019-02-14 MED ORDER — FUROSEMIDE 10 MG/ML IJ SOLN
40.0000 mg | Freq: Two times a day (BID) | INTRAMUSCULAR | Status: DC
  Administered 2019-02-14 – 2019-02-16 (×4): 40 mg via INTRAVENOUS
  Filled 2019-02-14 (×4): qty 4

## 2019-02-14 MED ORDER — ENOXAPARIN SODIUM 40 MG/0.4ML ~~LOC~~ SOLN
40.0000 mg | SUBCUTANEOUS | Status: DC
  Administered 2019-02-14 – 2019-02-15 (×2): 40 mg via SUBCUTANEOUS
  Filled 2019-02-14 (×3): qty 0.4

## 2019-02-14 NOTE — ED Notes (Signed)
Pts family brought food. Pt eating at this time. Pt came to ambulate patient but pt reports she would prefer to eat. Pt will ambulate patient tomorrow.

## 2019-02-14 NOTE — Progress Notes (Signed)
PHARMACIST - PHYSICIAN COMMUNICATION  CONCERNING:  Enoxaparin (Lovenox) for DVT Prophylaxis    RECOMMENDATION: Patient was prescribed enoxaprin 30mg  q24 hours for VTE prophylaxis.   Filed Weights   02/13/19 1611  Weight: 220 lb (99.8 kg)    Body mass index is 38.97 kg/m.  Estimated Creatinine Clearance: 38.9 mL/min (A) (by C-G formula based on SCr of 1.43 mg/dL (H)).   Patient is candidate for enoxaparin 40mg  every 24 hours based on CrCl >61ml/min AND Weight > 45kg for women  DESCRIPTION: Pharmacy has adjusted enoxaparin dose per [facility] policy.  Patient is now receiving enoxaparin 40mg  every 24 hours.   Lu Duffel, PharmD, BCPS Clinical Pharmacist 02/14/2019 2:18 PM

## 2019-02-14 NOTE — Consult Note (Signed)
Cardiology Consult    Patient ID: BRIARROSE SEGALE MRN: [id number], DOB/AGE: [birth year]   Admit date: 02/13/2019 Date of Consult: 02/14/2019  Primary Physician: Ricardo Jericho, NP Primary Cardiologist: Ida Rogue, MD Requesting Provider: Cathlean Sauer, MD  Patient Profile    Karen [name] is a 73 y.o. female with a history of HFpEF, HTN, HL, obesity, CKD III, DMII, GIB, COPD on home O2, anemia, obesity, and jxnl bradycardia in the setting of  blocker usage, who is being seen today for the evaluation of jxnl bradycardia in the setting of  blocker usage and CHF at the request of Dr. Clementeen Graham.  Past Medical History   Past Medical History:  Diagnosis Date  . (HFpEF) heart failure with preserved ejection fraction ([place]) 2017   a. 2017 Echo: EF 50%; b. 06/2018 Echo: EF 50-55%; c. 08/2018 Echo: EF 50-55%, Nl RV fxn. RVSP 60.78mmHg. Mild BAE. Mild to mod TR.     Marland Kitchen Acute on chronic respiratory failure with hypoxia and hypercapnia ([place]) 01/07/2015  . Anemia   . 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 Asthma   . Cataract   . CKD (chronic kidney disease), stage III   . COPD (chronic obstructive pulmonary disease) ([place])    (1) 06/2018 tobacco use, home 3L oxygen   . Diabetes mellitus without complication (HCC)    (1) A1C 7.7 (06/2018)  . Edema, peripheral 04/20/2014  . History of kidney stones   . Hyperlipidemia   . Hypertension   . Iron deficiency anemia 06/22/2014  . Junctional bradycardia    a. In setting of beta blocker therapy.  . Leucocytosis 10/19/2015  . Morbid obesity ([place])   . Overactive bladder   . Primary osteoarthritis of right knee 09/01/2016  . Sciatica 01/07/2015    Past Surgical History:  Procedure Laterality Date  . APPENDECTOMY    . CESAREAN SECTION     x3  . CHOLECYSTECTOMY    . COLONOSCOPY WITH PROPOFOL N/A 08/28/2017   Procedure: COLONOSCOPY WITH PROPOFOL;  Surgeon: Lucilla Lame, MD;  Location: [facility] ENDOSCOPY;  Service: Endoscopy;  Laterality: N/A;  . COLONOSCOPY WITH  PROPOFOL N/A 08/29/2017   Procedure: COLONOSCOPY WITH PROPOFOL;  Surgeon: Lucilla Lame, MD;  Location: [facility] ENDOSCOPY;  Service: Endoscopy;  Laterality: N/A;  . CYSTOSCOPY W/ URETERAL STENT PLACEMENT Right 09/15/2017   Procedure: CYSTOSCOPY WITH RETROGRADE PYELOGRAM/URETERAL STENT PLACEMENT;  Surgeon: Cleon Gustin, MD;  Location: [facility] ORS;  Service: Urology;  Laterality: Right;  . CYSTOSCOPY/URETEROSCOPY/HOLMIUM LASER/STENT PLACEMENT Right 10/09/2017   Procedure: CYSTOSCOPY/URETEROSCOPY/HOLMIUM LASER/STENT PLACEMENT;  Surgeon: Abbie Sons, MD;  Location: [facility] ORS;  Service: Urology;  Laterality: Right;  right Stent exchange  . ESOPHAGOGASTRODUODENOSCOPY (EGD) WITH PROPOFOL N/A 09/16/2018   Procedure: ESOPHAGOGASTRODUODENOSCOPY (EGD) WITH PROPOFOL;  Surgeon: Lin Landsman, MD;  Location: [place] [facility];  Service: Gastroenterology;  Laterality: N/A;  . EYE SURGERY       Allergies  Allergies  Allergen Reactions  . Ace Inhibitors Hives  . Beta Adrenergic Blockers     Junctional bradycardia  . Gabapentin Hives  . Lisinopril Hives  . Lyrica [Pregabalin] Hives  . Shrimp [Shellfish Allergy] Swelling    Swelling of the lips    History of Present Illness    73 y.o. female with a history of HFpEF, HTN, HL, obesity, CKD III, COPD on home O2, DMII,  anemia, obesity, GIB, and jxnl bradycardia in the setting of  blocker usage.  Echos in 06/2018 and 08/2018, both showed low nl LV fxn w/ and EF  of 50-55%.  In June of this year, she was admitted w/ jxnl brady and hypotension.  Bisoprolol therapy was discontinued and after a brief course of dopamine, she stabilized and subsequently discharged.  In July, she was admitted w/ GIB and hematemesis.  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  She required 1u prbcs during that admission.  She was doing well at her outpt office visit in late July and has since been seen by paramedicine on two occasions  (last 11/26/2018).    Unfortunately, she recently noted increasing DOE and worsening of chronic low back pain.  She presented to urgent care on 12/10 and she was felt to be volume overloaded, while also being noted to be bradycardic.  She was referred to the ED, where ECG showed junctional bradycardia @ 47, w/ retrograde P waves. No acute ST/T changes.  Upon eval of her med list, it was noted that she was still taking bisoprolol, despite it having been d/c'd in June.  CXR showed CHF and interstitial edema, while creat was elev above baseline @ 2.25.   blocker was held and she was given lasix IV 40mg  x 1 yesterday and x 1 today.  HRs have stabilized off of  blocker and creat has improved to 1.43 w/ diuresis.  She notes improvement in breathing as well.  Inpatient Medications    . atorvastatin  10 mg Oral Daily  . darifenacin  15 mg Oral Daily  . enoxaparin (LOVENOX) injection  30 mg Subcutaneous Q24H  . ferrous sulfate  325 mg Oral Daily  . fluticasone furoate-vilanterol  1 puff Inhalation Daily   And  . umeclidinium bromide  1 puff Inhalation Daily  . furosemide  40 mg Intravenous BID  . insulin pump   Subcutaneous TID WC, HS, 0200  . ipratropium-albuterol  3 mL Inhalation QID  . lidocaine  1 patch Transdermal Q24H  . montelukast  10 mg Oral QHS  . sodium chloride flush  3 mL Intravenous Q12H  . vitamin B-12  500 mcg Oral Daily    Family History    Family History  Problem Relation Age of Onset  . Other Mother        unknown medical history  . Other Father        unknown medical history   She indicated that her mother is deceased. She indicated that her father is deceased.   Social History    Social History   Socioeconomic History  . Marital status: Married    Spouse name: Not on file  . Number of children: Not on file  . Years of education: Not on file  . Highest education level: Not on file  Occupational History  . Not on file  Tobacco Use  . Smoking status: Former  Smoker    Packs/day: 1.00    Years: 20.00    Pack years: 20.00    Quit date: 12/04/1992    Years since quitting: 26.2  . Smokeless tobacco: Never Used  Substance and Sexual Activity  . Alcohol use: No  . Drug use: No  . Sexual activity: Not on file  Other Topics Concern  . Not on file  Social History Narrative  . Not on file   Social Determinants of Health   Financial Resource Strain:   . Difficulty of Paying Living Expenses: Not on file  Food Insecurity:   . Worried About Charity fundraiser in the Last Year: Not on file  .  Ran Out of Food in the Last Year: Not on file  Transportation Needs:   . Lack of Transportation (Medical): Not on file  . Lack of Transportation (Non-Medical): Not on file  Physical Activity:   . Days of Exercise per Week: Not on file  . Minutes of Exercise per Session: Not on file  Stress:   . Feeling of Stress : Not on file  Social Connections:   . Frequency of Communication with Friends and Family: Not on file  . Frequency of Social Gatherings with Friends and Family: Not on file  . Attends Religious Services: Not on file  . Active Member of Clubs or Organizations: Not on file  . Attends Archivist Meetings: Not on file  . Marital Status: Not on file  Intimate Partner Violence:   . Fear of Current or Ex-Partner: Not on file  . Emotionally Abused: Not on file  . Physically Abused: Not on file  . Sexually Abused: Not on file     Review of Systems    General:  +++ malaise/fatigue. No chills, fever, night sweats or weight changes.  Cardiovascular:  No chest pain, +++ dyspnea on exertion, +++ LE edema, +++ orthopnea, no palpitations, paroxysmal nocturnal dyspnea. Dermatological: No rash, lesions/masses Respiratory: No cough, +++ dyspnea Urologic: No hematuria, dysuria Abdominal:   No nausea, vomiting, diarrhea, bright red blood per rectum, melena, or hematemesis Neurologic:  No visual changes, wkns, changes in mental status. All other  systems reviewed and are otherwise negative except as noted above.  Physical Exam    Blood pressure 101/63, pulse 66, temperature 98.6 F (37 C), temperature source Oral, resp. rate 20, height 5\' 3"  (1.6 m), weight 99.8 kg, SpO2 97 %.  General: Pleasant, NAD Psych: Normal affect. Neuro: Alert and oriented X 3. Moves all extremities spontaneously. HEENT: Normal  Neck: Supple without bruits.  Obese, difficult to gauge jvp. Lungs:  Resp regular and unlabored, CTA. Heart: RRR no s3, s4, or murmurs. Abdomen: Obese, soft, non-tender, non-distended, BS + x 4.  Extremities: No clubbing, cyanosis.  Trace bilat LE edema. DP/PT/Radials 1+ and equal bilaterally.  Labs    Cardiac Enzymes Recent Labs  Lab 02/13/19 1619 02/13/19 1820  TROPONINIHS 6 6      Lab Results  Component Value Date   WBC 13.1 (H) 02/13/2019   HGB 10.3 (L) 02/13/2019   HCT 31.6 (L) 02/13/2019   MCV 88.0 02/13/2019   PLT 245 02/13/2019    Recent Labs  Lab 02/14/19 0517  NA 137  K 4.4  CL 98  CO2 27  BUN 65*  CREATININE 1.43*  CALCIUM 8.9  GLUCOSE 258*    Radiology Studies    DG Chest 2 View  Result Date: 02/13/2019 CLINICAL DATA:  Shortness of breath, history of CHF and COPD EXAM: CHEST - 2 VIEW COMPARISON:  Radiograph 08/05/2018, CT 06/23/2016 FINDINGS: Chronic lung hyperinflation with flattening of the diaphragms. There are basilar predominant hazy interstitial opacities with fissural and septal thickening. Suspect trace bilateral effusions. Mild cardiomegaly is similar to comparison portable radiograph. The aorta is calcified. No pneumothorax. No acute osseous or soft tissue abnormality. Degenerative changes are present in the imaged spine and shoulders. IMPRESSION: Features suggestive of CHF with interstitial edema, cardiomegaly and probable trace bilateral effusions. Aortic Atherosclerosis (ICD10-I70.0). Electronically Signed   By: Lovena Le M.D.   On: 02/13/2019 16:42    ECG & Cardiac Imaging     Junctional bradycardia @ 47.  No acute ST/T changes -  personally reviewed.  Assessment & Plan    1.  Junctional bradycardia:  Pt w/ h/o jxnl brady in 08/2018 in the setting of bisoprolol therapy.  Unfortunately, she mistakenly resumed this as her pharmacy was unaware that it had been d/c'd and refilled it recently.  HRs now better - 60's, off of  blocker.   blocker now added to her allergy list.  We will need to avoid all AVN blocking agents in her in the future.  2.  Acute on chronic diastolic CHF:  Progressive dyspnea recently w/ CHF on cxr.  Creat initially up on arrival - ? Low output in setting of bradycardia for undetermined amt of time.  With diuresis, breathing and renal fxn has improved.  Echo this AM prelim shows ongoing elev right heart pressures.  Will write for another dose of IV lasix tonight and in the AM.  Losartan-hctz currently on hold in setting of AKI, but can hopefully resume on dc.  3.  Essential HTN:  BP soft currently.  Losartan-hctz on hold.  Follow.  4.  HL: on statin.  LDL 44 in Nov.  5.  AKI/CKD III: Creat improving w/ diuresis.  Follow.  6.  Leukocytosis:  Afebrile.  Covid neg.  No evidence for pna on cxr.    Signed, Murray Hodgkins, NP 02/14/2019, 12:55 PM  For questions or updates, please contact   Please consult [email or web address] for contact info under Cardiology/STEMI.

## 2019-02-14 NOTE — ED Notes (Signed)
Assumed care of patient reports was unable to sleep, feeling lousy. Vss, patient in house bed appear comfortable, urine out put over 600 ml cloudy urine. Call light w/i reach. Safety maintained. Patient awaiting bed status.

## 2019-02-14 NOTE — Progress Notes (Signed)
Pt arrived to room 258 for treatment of CHF.  Pt settled in room and oriented to safety measures.  Bed low, brakes locked, call light and phone within reach.

## 2019-02-14 NOTE — ED Notes (Signed)
DNR bracelet placed on right wrist  

## 2019-02-14 NOTE — ED Notes (Signed)
Blood Glucose 255

## 2019-02-14 NOTE — Progress Notes (Signed)
PROGRESS NOTE                                                                                                                                                                                                             Patient Demographics:    Karen [name], is a 73 y.o. female, DOB - Dec 12, 1945, [id number]  Admit date - 02/13/2019   Admitting Physician Nicolette Bang, DO  Outpatient Primary MD for the patient is White, Orlene Och, NP  LOS - 1    Chief Complaint  Patient presents with  . Bradycardia  . Shortness of Breath  . Back Pain       Brief Narrative 73 year old female with history of diastolic CHF, COPD on 3 L home oxygen, type 2 diabetes mellitus sent from urgent care with shortness of breath for past few days and leg swellings.  Patient also reports worsening of her low back pain In the ED she was found to be in acute CHF along with  junctional bradycardia.  Also found to have acute kidney injury.  Subjective Reports breathing to be slightly better since she came in.  Still feels weak.    Assessment  & Plan :   Principal problem acute on chronic diastolic CHF ([place]) Presenting with >5 pound weight gain and worsened lower extremity edema.  Elevated BNP.  Chest x-ray with interstitial edema.  Echo done 5 months back with EF of 50-55%. Continue IV Lasix 40 mg every 12 hours.  Strict I's/O and daily weight. Follow repeat 2D echo.  Active problems Junctional bradycardia Was on bisoprolol previously and had junctional bradycardia in June this year.  Was discontinued but patient unknowingly resumed it.  Now off beta-blocker and heart rate stable.  Cardiology consult recommends avoiding AV nodal blocking agents.  Essential hypertension Soft blood pressure.  HCTZ-losartan held  Acute kidney injury on chronic kidney disease stage IIIa ?  Cardiorenal.  HCTZ and losartan held.  Renal  function improving with IV Lasix this morning.  COPD with chronic hypoxic respiratory failure No acute exacerbation.  Continue home inhaler  Type 2 diabetes mellitus with hyperglycemia, uncontrolled On insulin pump at home which is resumed.  Code Status : DNR  Family Communication  : None  Disposition Plan  : Home possibly in the next 48 hours depending upon improvement  Barriers For  Discharge : Active symptoms  Consults  : Cardiology  Procedures  : 2D echo  DVT Prophylaxis  :  Lovenox -   Lab Results  Component Value Date   PLT 245 02/13/2019    Antibiotics  :    Anti-infectives (From admission, onward)   None        Objective:   Vitals:   02/14/19 0800 02/14/19 0830 02/14/19 0930 02/14/19 1141  BP: (!) 115/46 126/60 94/66 101/63  Pulse: 66 65 63 66  Resp: (!) 22 (!) 23 (!) 21 20  Temp:    98.6 F (37 C)  TempSrc:    Oral  SpO2: 98% 95% 96% 97%  Weight:      Height:        Wt Readings from Last 3 Encounters:  02/13/19 99.8 kg  02/13/19 95.3 kg  09/30/18 95.6 kg     Intake/Output Summary (Last 24 hours) at 02/14/2019 1325 Last data filed at 02/14/2019 0839 Gross per 24 hour  Intake --  Output 2200 ml  Net -2200 ml     Physical Exam  Gen: not in distress, fatigued  HEENT: no pallor, moist mucosa, supple neck Chest: Diminished bibasilar breath sounds CVS: N S1&S2, no murmurs, rubs or gallop GI: soft, NT, ND, BS+ Musculoskeletal: warm, 1+ pitting edema bilaterally     Data Review:    CBC Recent Labs  Lab 02/13/19 1619  WBC 13.1*  HGB 10.3*  HCT 31.6*  PLT 245  MCV 88.0  MCH 28.7  MCHC 32.6  RDW 15.8*    Chemistries  Recent Labs  Lab 02/13/19 1619 02/14/19 0517  NA 136 137  K 5.7* 4.4  CL 99 98  CO2 24 27  GLUCOSE 280* 258*  BUN 72* 65*  CREATININE 2.25* 1.43*  CALCIUM 8.9 8.9   ------------------------------------------------------------------------------------------------------------------ No results for  input(s): CHOL, HDL, LDLCALC, TRIG, CHOLHDL, LDLDIRECT in the last 72 hours.  Lab Results  Component Value Date   HGBA1C 7.7 (H) 06/25/2018   ------------------------------------------------------------------------------------------------------------------ No results for input(s): TSH, T4TOTAL, T3FREE, THYROIDAB in the last 72 hours.  Invalid input(s): FREET3 ------------------------------------------------------------------------------------------------------------------ No results for input(s): VITAMINB12, FOLATE, FERRITIN, TIBC, IRON, RETICCTPCT in the last 72 hours.  Coagulation profile No results for input(s): INR, PROTIME in the last 168 hours.  No results for input(s): DDIMER in the last 72 hours.  Cardiac Enzymes No results for input(s): CKMB, TROPONINI, MYOGLOBIN in the last 168 hours.  Invalid input(s): CK ------------------------------------------------------------------------------------------------------------------    Component Value Date/Time   BNP 622.0 (H) 02/13/2019 1619    Inpatient Medications  Scheduled Meds: . atorvastatin  10 mg Oral Daily  . darifenacin  15 mg Oral Daily  . enoxaparin (LOVENOX) injection  30 mg Subcutaneous Q24H  . ferrous sulfate  325 mg Oral Daily  . fluticasone furoate-vilanterol  1 puff Inhalation Daily   And  . umeclidinium bromide  1 puff Inhalation Daily  . furosemide  40 mg Intravenous BID  . insulin pump   Subcutaneous TID WC, HS, 0200  . ipratropium-albuterol  3 mL Inhalation QID  . lidocaine  1 patch Transdermal Q24H  . montelukast  10 mg Oral QHS  . sodium chloride flush  3 mL Intravenous Q12H  . vitamin B-12  500 mcg Oral Daily   Continuous Infusions: . sodium chloride     PRN Meds:.sodium chloride, acetaminophen, albuterol, ondansetron (ZOFRAN) IV, sodium chloride flush  Micro Results Recent Results (from the past 240 hour(s))  SARS CORONAVIRUS 2 (TAT 6-24  HRS) Nasopharyngeal Nasopharyngeal Swab     Status:  None   Collection Time: 02/13/19  7:25 PM   Specimen: Nasopharyngeal Swab  Result Value Ref Range Status   SARS Coronavirus 2 NEGATIVE NEGATIVE Final    Comment: (NOTE) SARS-CoV-2 target nucleic acids are NOT DETECTED. The SARS-CoV-2 RNA is generally detectable in upper and lower respiratory specimens during the acute phase of infection. Negative results do not preclude SARS-CoV-2 infection, do not rule out co-infections with other pathogens, and should not be used as the sole basis for treatment or other patient management decisions. Negative results must be combined with clinical observations, patient history, and epidemiological information. The expected result is Negative. Fact Sheet for Patients: SugarRoll.be Fact Sheet for Healthcare Providers: https://www.woods-mathews.com/ This test is not yet approved or cleared by the [place] FDA and  has been authorized for detection and/or diagnosis of SARS-CoV-2 by FDA under an Emergency Use Authorization (EUA). This EUA will remain  in effect (meaning this test can be used) for the duration of the COVID-19 declaration under Section 56 4(b)(1) of the Act, 21 U.S.C. section 360bbb-3(b)(1), unless the authorization is terminated or revoked sooner. Performed at [facility], [place] [address], [place], [place] [address]     Radiology Reports DG Chest 2 View  Result Date: 02/13/2019 CLINICAL DATA:  Shortness of breath, history of CHF and COPD EXAM: CHEST - 2 VIEW COMPARISON:  Radiograph 08/05/2018, CT 06/23/2016 FINDINGS: Chronic lung hyperinflation with flattening of the diaphragms. There are basilar predominant hazy interstitial opacities with fissural and septal thickening. Suspect trace bilateral effusions. Mild cardiomegaly is similar to comparison portable radiograph. The aorta is calcified. No pneumothorax. No acute osseous or soft tissue abnormality. Degenerative changes are present  in the imaged spine and shoulders. IMPRESSION: Features suggestive of CHF with interstitial edema, cardiomegaly and probable trace bilateral effusions. Aortic Atherosclerosis (ICD10-I70.0). Electronically Signed   By: Lovena Le M.D.   On: 02/13/2019 16:42    Time Spent in minutes 35   Allis Quirarte M.D on 02/14/2019 at 1:25 PM  Between 7am to 7pm - Pager - [phone]  After 7pm go to [email or web address] - password [facility]  [facility] -  Office  [phone]

## 2019-02-14 NOTE — Progress Notes (Addendum)
Inpatient Diabetes Program Recommendations  AACE/ADA: New Consensus Statement on Inpatient Glycemic Control (2015)  Target Ranges:  Prepandial:   less than 140 mg/dL      Peak postprandial:   less than 180 mg/dL (1-2 hours)      Critically ill patients:  140 - 180 mg/dL   Results for TERRISA, CURFMAN (MRN [id number]) as of 02/14/2019 09:06  Ref. Range 02/14/2019 08:03  Glucose-Capillary Latest Ref Range: 70 - 99 mg/dL 255 (H)    Admit with: Acute on Chronic HFpEF/ Junctional Bradycardia   History: DM, COPD, CHF, CKD  Home DM Meds: Insulin Pump       Invokana 100 mg Daily       Metformin 1000 mg BID  Current Orders: Insulin Pump ac + hs + 2am     Endocrinologist: Dr. Honor Junes and [facility]--Last seen 12/10/2018--  Instructed to Continue the Metformin and Invokana.  Also told to Continue her VGO 40 Disposable Insulin pump and use 7,0,1 clicks for small, medium, large meals and Take an extra click if pre-meal CBG is >170 mg/dl and Take 6 clicks if pre-meal CBG is >250 mg/dl.  Also Continue Freestyle Libre CGM.   Note patient currently has orders allowing her to use her insulin pump in the hospital.    Addendum 12:15pm--Met with pt down in the ED.  Pt A&O and able to independently operate her VGO insulin pump.  Per pt. Her son brought her insulin and 6 VGO insulin pump kits to the hospital.  Pt has enough pump supplies for 6 days in the hospital.  Pt's insulin pump is a 24 hour disposable pump and she will need to change her pump out every 24 hours.  Pt knows how to do this.  Pt has Freestyle CGM on her arm and often checks her CBGs with this device.  Pt is agreeable to the RN checking her fingerstick CBGs TID AC +2am as ordered.  Asked pt to please tell her RN how much insulin she gives herself with each meal.  Per her VGO pump, pt gets 40 units of a basal rate every 24 hours and will give herself "clicks" with each meals.  Each "click" is equivalent to 2  units insulin.      --Will follow patient during hospitalization--  Wyn Quaker RN, MSN, CDE Diabetes Coordinator Inpatient Glycemic Control Team Team Pager: [phone] (8a-5p)

## 2019-02-14 NOTE — Progress Notes (Signed)
PT Cancellation Note  Patient Details Name: Karen [name] MRN: [id number] DOB: Feb 23, 1946   Cancelled Treatment:    Reason Eval/Treat Not Completed: Other (comment).  Upon therapist arrival to pt's room in the ED, pt resting in bed and visitor present (brought food).  Pt declining PT at this time d/t requesting to eat instead.  Will re-attempt PT evaluation at a later date/time.  Leitha Bleak, PT 02/14/19, 4:14 PM

## 2019-02-14 NOTE — Plan of Care (Signed)

## 2019-02-14 NOTE — Progress Notes (Signed)
*  PRELIMINARY RESULTS* Echocardiogram 2D Echocardiogram has been performed.  Karen [name] Karen [name] 02/14/2019, 11:53 AM

## 2019-02-14 NOTE — Progress Notes (Signed)
PT Cancellation Note  Patient Details Name: Karen [name] MRN: [id number] DOB: 09/19/1945   Cancelled Treatment:    Reason Eval/Treat Not Completed: Other (comment).  PT consult received.  Chart reviewed.  Nurse cleared pt for participation in therapy.  Pt in ED room and resting in bed upon PT arrival.  Pt declining PT session d/t not eating anything since yesterday morning and needing to eat (before doing any therapy); pt also reporting having insulin that needed to be put in the fridge (therapist called pt's nurse immediately to notify her of all of this).  Will re-attempt PT evaluation at a later date/time as able.  Leitha Bleak, PT 02/14/19, 10:04 AM

## 2019-02-15 DIAGNOSIS — I5033 Acute on chronic diastolic (congestive) heart failure: Secondary | ICD-10-CM

## 2019-02-15 LAB — BASIC METABOLIC PANEL
Anion gap: 11 (ref 5–15)
BUN: 40 mg/dL — ABNORMAL HIGH (ref 8–23)
CO2: 32 mmol/L (ref 22–32)
Calcium: 9 mg/dL (ref 8.9–10.3)
Chloride: 96 mmol/L — ABNORMAL LOW (ref 98–111)
Creatinine, Ser: 0.83 mg/dL (ref 0.44–1.00)
GFR calc Af Amer: >60 mL/min (ref >60)
GFR calc non Af Amer: >60 mL/min (ref >60)
Glucose, Bld: 136 mg/dL — ABNORMAL HIGH (ref 70–99)
Potassium: 3.6 mmol/L (ref 3.5–5.1)
Sodium: 139 mmol/L (ref 135–145)

## 2019-02-15 LAB — GLUCOSE, CAPILLARY
Glucose-Capillary: 279 mg/dL — ABNORMAL HIGH (ref 70–99)
Glucose-Capillary: 237 mg/dL — ABNORMAL HIGH (ref 70–99)
Glucose-Capillary: 252 mg/dL — ABNORMAL HIGH (ref 70–99)
Glucose-Capillary: 148 mg/dL — ABNORMAL HIGH (ref 70–99)

## 2019-02-15 MED ORDER — IPRATROPIUM-ALBUTEROL 0.5-2.5 (3) MG/3ML IN SOLN
3.0000 mL | Freq: Three times a day (TID) | RESPIRATORY_TRACT | Status: DC
  Administered 2019-02-15 – 2019-02-16 (×3): 3 mL via RESPIRATORY_TRACT
  Filled 2019-02-15 (×4): qty 3

## 2019-02-15 MED ORDER — POTASSIUM CHLORIDE CRYS ER 20 MEQ PO TBCR
40.0000 meq | EXTENDED_RELEASE_TABLET | Freq: Once | ORAL | Status: AC
  Administered 2019-02-15: 40 meq via ORAL
  Filled 2019-02-15: qty 2

## 2019-02-15 MED ORDER — TRAZODONE HCL 50 MG PO TABS
50.0000 mg | ORAL_TABLET | Freq: Every day | ORAL | Status: DC
  Administered 2019-02-15 – 2019-02-16 (×2): 50 mg via ORAL
  Filled 2019-02-15 (×2): qty 1

## 2019-02-15 MED ORDER — FLUTICASONE-UMECLIDIN-VILANT 100-62.5-25 MCG/INH IN AEPB: 1.0000 | INHALATION_SPRAY | Freq: Every day | RESPIRATORY_TRACT | Status: DC

## 2019-02-15 MED ORDER — FLUTICASONE PROPIONATE 50 MCG/ACT NA SUSP
2.0000 | Freq: Every day | NASAL | Status: DC
  Administered 2019-02-15 – 2019-02-16 (×2): 2 via NASAL
  Filled 2019-02-15: qty 16

## 2019-02-15 NOTE — Evaluation (Signed)
Physical Therapy Evaluation Patient Details Name: Karen [name] MRN: [id number] DOB: 31-Jul-1945 Today's Date: 02/15/2019   History of Present Illness  73 yo female admitted for COPD exacerbation after presenting so ED with c/o SOB, pt seen earlier in day for acute on chronic LBP and noted to be bradycardic and have LE swelling. PMH includes CHF, COPD on 3L O2 [place], DM  Clinical Impression  Pt 73 yo female admitted for above. Pt sleeping in bed upon arrival, easy to arouse and agreeable to PT. Pt reports living with her husband who is able to provide assist 24/7 and her son who is available intermittingly. Pt on 3L O2 [place] at home reporting no concerns or issues managing O2 at home. Pt denies any falls last 3 months. Pt required supervision to min guard assistance for functional mobility. Pt presents with strength, balance, power, endurance and cardiopulmonary deficits limiting functional mobility. SpO2 monitored t/o session with O2 sats 93% prior to ambulation and 94% post ambulation on 3L O2. Pt states she can manage at home with her husband and sons help and is not interested in any home health therapy. Pt educated on outpatient therapy with cardiac and pulmonary rehab options available. Pt would benefit from those programs to improve deficits and independence. Pt will benefit from further skilled acute PT to improve deficits in order to maximize functional mobility and return to PLOF.     Follow Up Recommendations Outpatient PT(cardiac rehab, lung works pulmonary rehab)    Equipment Recommendations  None recommended by PT    Recommendations for Other Services       Precautions / Restrictions Precautions Precautions: Fall Precaution Comments: monitor O2 Restrictions Weight Bearing Restrictions: No      Mobility  Bed Mobility Overal bed mobility: Needs Assistance Bed Mobility: Supine to Sit     Supine to sit: Supervision;HOB elevated     General bed mobility comments: pt able to  get EOB with increased time and effort, utilization of bed rails with HOB elevated, no phsyical assist or cuing required  Transfers Overall transfer level: Needs assistance Equipment used: Rolling walker (2 wheeled) Transfers: Sit to/from Stand Sit to Stand: Min guard         General transfer comment: min guard for safety, pt stands from bed with increased trunk flexion although steady, increased time and effort, cuing for hand placement and use of RW  Ambulation/Gait Ambulation/Gait assistance: Min guard Gait Distance (Feet): 15 Feet Assistive device: Rolling walker (2 wheeled) Gait Pattern/deviations: Step-through pattern;Decreased stride length;Decreased dorsiflexion - left;Decreased dorsiflexion - right;Trunk flexed Gait velocity: decreased   General Gait Details: pt ambulated within room with small, shuffle steps with decreased ground clearance and DF bil, reliance on B UE support from RW, no buckling, unsteadiness or LOB noted, pt reported she is a limited household ambulator at baseline  Financial trader [employer] (Stroke Patients Only)       Balance Overall balance assessment: Mild deficits observed, not formally tested(steady sitting EOB, reliant on RW for ambulation)                                           Pertinent Vitals/Pain Pain Assessment: Faces Faces Pain Scale: Hurts whole lot Pain Location: B knees (chronic) Pain Descriptors / Indicators: Aching;Sore;Grimacing Pain Intervention(s): Limited activity within  patient's tolerance;Monitored during session;Repositioned    Home Living Family/patient expects to be discharged to:: Private residence Living Arrangements: Spouse/significant other;Children(son) Available Help at Discharge: Family;Available 24 hours/day Type of Home: Mobile home Home Access: Ramped entrance     Home Layout: One level Home Equipment: [place] - 2 wheels;Cane - single point;Grab  bars - tub/shower      Prior Function Level of Independence: Independent with assistive device(s)         Comments: ambulating with RW, no falls last 3 months, husband does cooking and housework,     Journalist, newspaper        Extremity/Trunk Assessment   Upper Extremity Assessment Upper Extremity Assessment: Generalized weakness(strength grossly 3+/5)    Lower Extremity Assessment Lower Extremity Assessment: Generalized weakness(strength grossly 3+/5)       Communication   Communication: No difficulties  Cognition Arousal/Alertness: Awake/alert Behavior During Therapy: WFL for tasks assessed/performed Overall Cognitive Status: Within Functional Limits for tasks assessed                                        General Comments      Exercises Total Joint Exercises Ankle Circles/Pumps: AROM;Both;10 reps Long Arc Quad: AROM;Both;10 reps Marching in Standing: AROM;Both;10 reps;Seated Other Exercises Other Exercises: educated on supine LE therex to perform on her own and encouraged to do throughout her day including SLR and hip ABD   Assessment/Plan    PT Assessment Patient needs continued PT services  PT Problem List Decreased strength;Decreased mobility;Decreased range of motion;Decreased activity tolerance;Decreased balance;Decreased knowledge of use of DME;Cardiopulmonary status limiting activity       PT Treatment Interventions DME instruction;Therapeutic exercise;Gait training;Balance training;Stair training;Functional mobility training;Therapeutic activities;Patient/family education    PT Goals (Current goals can be found in the Care Plan section)  Acute Rehab PT Goals Patient Stated Goal: go home PT Goal Formulation: With patient Time For Goal Achievement: 03/01/19 Potential to Achieve Goals: Good    Frequency Min 2X/week   Barriers to discharge        Co-evaluation               AM-PAC PT "6 Clicks" Mobility  Outcome Measure  Help needed turning from your back to your side while in a flat bed without using bedrails?: A Little Help needed moving from lying on your back to sitting on the side of a flat bed without using bedrails?: A Little Help needed moving to and from a bed to a chair (including a wheelchair)?: A Little Help needed standing up from a chair using your arms (e.g., wheelchair or bedside chair)?: A Little Help needed to walk in hospital room?: A Little Help needed climbing 3-5 steps with a railing? : A Lot 6 Click Score: 17    End of Session Equipment Utilized During Treatment: Gait belt;Oxygen Activity Tolerance: Patient limited by fatigue;Patient limited by pain Patient left: in chair;with call bell/phone within reach;with chair alarm set Nurse Communication: Mobility status PT Visit Diagnosis: Muscle weakness (generalized) (M62.81);Difficulty in walking, not elsewhere classified (R26.2)    Time: [id number] PT Time Calculation (min) (ACUTE ONLY): 27 min   Charges:   PT Evaluation $PT Eval Moderate Complexity: 1 Mod PT Treatments $Therapeutic Exercise: 8-22 mins        Zachary George PT, DPT 9:44 AM,02/15/19   Param Capri Drucilla Chalet 02/15/2019, 9:40 AM

## 2019-02-15 NOTE — Progress Notes (Signed)
Progress Note  Patient Name: Karen [name] Date of Encounter: 02/15/2019  Primary Cardiologist: Ida Rogue, MD   Subjective   Breathing is improved   Still wheezing though  No CP    Inpatient Medications    Scheduled Meds: . atorvastatin  10 mg Oral Daily  . darifenacin  15 mg Oral Daily  . enoxaparin (LOVENOX) injection  40 mg Subcutaneous Q24H  . ferrous sulfate  325 mg Oral Daily  . fluticasone furoate-vilanterol  1 puff Inhalation Daily   And  . umeclidinium bromide  1 puff Inhalation Daily  . furosemide  40 mg Intravenous BID  . insulin pump   Subcutaneous TID WC, HS, 0200  . ipratropium-albuterol  3 mL Inhalation QID  . lidocaine  1 patch Transdermal Q24H  . montelukast  10 mg Oral QHS  . sodium chloride flush  3 mL Intravenous Q12H  . traZODone  50 mg Oral QHS  . vitamin B-12  500 mcg Oral Daily   Continuous Infusions: . sodium chloride     PRN Meds: sodium chloride, acetaminophen, albuterol, ondansetron (ZOFRAN) IV, sodium chloride flush   Vital Signs    Vitals:   02/14/19 1836 02/14/19 1920 02/15/19 0527 02/15/19 0910  BP: (!) 124/50 (!) 155/53 (!) 158/65 (!) 125/44  Pulse: 79 98 70 74  Resp: 16   18  Temp: 98 F (36.7 C) 97.6 F (36.4 C) 97.8 F (36.6 C) 97.9 F (36.6 C)  TempSrc: Oral Oral Oral   SpO2: 92% 96% 96% 93%  Weight:   95.8 kg   Height:        Intake/Output Summary (Last 24 hours) at 02/15/2019 0949 Last data filed at 02/15/2019 0528 Gross per 24 hour  Intake --  Output 1750 ml  Net -1750 ml   Net neg 4.9 L   Last 3 Weights 02/15/2019 02/14/2019 02/13/2019  Weight (lbs) 211 lb 3.2 oz 212 lb 14.4 oz 220 lb  Weight (kg) 95.8 kg 96.571 kg 99.791 kg      Telemetry    SB to SR  No pauses    - Personally Reviewed  ECG    MPt dpme   - Personally Reviewed  Physical Exam   GEN: No acute distress.   Neck: No JVD Cardiac: RRR, no murmurs, rubs, or gallops.  Respiratory:MIld wheezes  Moving air GI: Soft, nontender,  non-distended  MS: No edema; No deformity. Neuro:  Nonfocal  Psych: Normal affect   Labs    High Sensitivity Troponin:   Recent Labs  Lab 02/13/19 1619 02/13/19 1820  TROPONINIHS 6 6      Chemistry Recent Labs  Lab 02/13/19 1619 02/14/19 0517 02/15/19 0418  NA 136 137 139  K 5.7* 4.4 3.6  CL 99 98 96*  CO2 24 27 32  GLUCOSE 280* 258* 136*  BUN 72* 65* 40*  CREATININE 2.25* 1.43* 0.83  CALCIUM 8.9 8.9 9.0  GFRNONAA 21* 36* >60  GFRAA 24* 42* >60  ANIONGAP 13 12 11      Hematology Recent Labs  Lab 02/13/19 1619  WBC 13.1*  RBC 3.59*  HGB 10.3*  HCT 31.6*  MCV 88.0  MCH 28.7  MCHC 32.6  RDW 15.8*  PLT 245    BNP Recent Labs  Lab 02/13/19 1619  BNP 622.0*     DDimer No results for input(s): DDIMER in the last 168 hours.   Radiology    DG Chest 2 View  Result Date: 02/13/2019 CLINICAL DATA:  Shortness  of breath, history of CHF and COPD EXAM: CHEST - 2 VIEW COMPARISON:  Radiograph 08/05/2018, CT 06/23/2016 FINDINGS: Chronic lung hyperinflation with flattening of the diaphragms. There are basilar predominant hazy interstitial opacities with fissural and septal thickening. Suspect trace bilateral effusions. Mild cardiomegaly is similar to comparison portable radiograph. The aorta is calcified. No pneumothorax. No acute osseous or soft tissue abnormality. Degenerative changes are present in the imaged spine and shoulders. IMPRESSION: Features suggestive of CHF with interstitial edema, cardiomegaly and probable trace bilateral effusions. Aortic Atherosclerosis (ICD10-I70.0). Electronically Signed   By: Lovena Le M.D.   On: 02/13/2019 16:42   ECHOCARDIOGRAM COMPLETE  Result Date: 02/14/2019   ECHOCARDIOGRAM REPORT   Patient Name:   Karen [name] Date of Exam: 02/14/2019 Medical Rec #:  [id number]        Height:       63.0 in Accession #:    [id number]       Weight:       220.0 lb Date of Birth:  14-Jul-1945       BSA:          2.01 m Patient Age:    73  years         BP:           167/75 mmHg Patient Gender: F                HR:           63 bpm. Exam Location:  [facility] Procedure: 2D Echo, Color Doppler and Cardiac Doppler Indications:     I48.91 Atrial fibrillation  History:         Patient has no prior history of Echocardiogram examinations.                  Risk Factors:Hypertension.  Sonographer:     Charmayne Sheer RDCS (AE) Referring Phys:  [id number] Nicolette Bang Diagnosing Phys: Bartholome Bill MD  Sonographer Comments: Suboptimal apical window. IMPRESSIONS  1. Left ventricular ejection fraction, by visual estimation, is 65 to 70%. The left ventricle has normal function. Left ventricular septal wall thickness was normal. Normal left ventricular posterior wall thickness. There is no left ventricular hypertrophy.  2. The left ventricle has no regional wall motion abnormalities.  3. Global right ventricle has normal systolic function.The right ventricular size is normal. No increase in right ventricular wall thickness.  4. Left atrial size was normal.  5. Right atrial size was normal.  6. The mitral valve is grossly normal. Trivial mitral valve regurgitation.  7. The tricuspid valve is grossly normal. Tricuspid valve regurgitation is trivial.  8. The aortic valve was not well visualized. Aortic valve regurgitation is not visualized.  9. The pulmonic valve was not well visualized. Pulmonic valve regurgitation is trivial. 10. The aortic root was not well visualized. 11. Moderately elevated pulmonary artery systolic pressure. 12. The atrial septum is grossly normal. FINDINGS  Left Ventricle: Left ventricular ejection fraction, by visual estimation, is 65 to 70%. The left ventricle has normal function. The left ventricle has no regional wall motion abnormalities. Normal left ventricular posterior wall thickness. There is no left ventricular hypertrophy. Right Ventricle: The right ventricular size is normal. No increase in right ventricular wall thickness. Global RV  systolic function is has normal systolic function. The tricuspid regurgitant velocity is 3.41 m/s, and with an assumed right atrial pressure  of 10 mmHg, the estimated right ventricular systolic pressure is moderately elevated at 56.5 mmHg. Left Atrium:  Left atrial size was normal in size. Right Atrium: Right atrial size was normal in size Pericardium: There is no evidence of pericardial effusion. Mitral Valve: The mitral valve is grossly normal. Trivial mitral valve regurgitation. MV peak gradient, 8.4 mmHg. Tricuspid Valve: The tricuspid valve is grossly normal. Tricuspid valve regurgitation is trivial. Aortic Valve: The aortic valve was not well visualized. Aortic valve regurgitation is not visualized. Aortic valve mean gradient measures 13.0 mmHg. Aortic valve peak gradient measures 20.6 mmHg. Aortic valve area, by VTI measures 1.29 cm. Pulmonic Valve: The pulmonic valve was not well visualized. Pulmonic valve regurgitation is trivial. Pulmonic regurgitation is trivial. Aorta: The aortic root was not well visualized. IAS/Shunts: The atrial septum is grossly normal.  LEFT VENTRICLE PLAX 2D LVIDd:         4.70 cm  Diastology LVIDs:         2.78 cm  LV e' lateral:   7.51 cm/s LV PW:         0.83 cm  LV E/e' lateral: 12.6 LV IVS:        0.82 cm  LV e' medial:    5.33 cm/s LVOT diam:     1.70 cm  LV E/e' medial:  17.7 LV SV:         73 ml LV SV Index:   34.00 LVOT Area:     2.27 cm  RIGHT VENTRICLE RV Basal diam:  3.51 cm LEFT ATRIUM             Index       RIGHT ATRIUM           Index LA diam:        4.50 cm 2.23 cm/m  RA Area:     15.80 cm LA Vol (A2C):   58.6 ml 29.10 ml/m RA Volume:   38.20 ml  18.97 ml/m LA Vol (A4C):   73.7 ml 36.60 ml/m LA Biplane Vol: 67.6 ml 33.57 ml/m  AORTIC VALVE                    PULMONIC VALVE AV Area (Vmax):    1.14 cm     PV Vmax:       1.42 m/s AV Area (Vmean):   1.08 cm     PV Vmean:      94.700 cm/s AV Area (VTI):     1.29 cm     PV VTI:        0.333 m AV Vmax:            227.00 cm/s  PV Peak grad:  8.1 mmHg AV Vmean:          172.000 cm/s PV Mean grad:  4.0 mmHg AV VTI:            0.540 m AV Peak Grad:      20.6 mmHg AV Mean Grad:      13.0 mmHg LVOT Vmax:         114.00 cm/s LVOT Vmean:        81.500 cm/s LVOT VTI:          0.306 m LVOT/AV VTI ratio: 0.57  AORTA Ao Root diam: 2.80 cm MITRAL VALVE                         TRICUSPID VALVE MV Area (PHT): 3.48 cm              TR Peak grad:  46.5 mmHg MV Peak grad:  8.4 mmHg              TR Vmax:        341.00 cm/s MV Mean grad:  2.0 mmHg MV Vmax:       1.45 m/s              SHUNTS MV Vmean:      68.8 cm/s             Systemic VTI:  0.31 m MV VTI:        0.39 m                Systemic Diam: 1.70 cm MV PHT:        63.22 msec MV Decel Time: 218 msec MV E velocity: 94.60 cm/s  103 cm/s MV A velocity: 121.00 cm/s 70.3 cm/s MV E/A ratio:  0.78        1.5  Bartholome Bill MD Electronically signed by Bartholome Bill MD Signature Date/Time: 02/14/2019/2:42:14 PM    Final     Cardiac Studies   Echo 02/14/19 1. Left ventricular ejection fraction, by visual estimation, is 65 to 70%. The left ventricle has normal function. Left ventricular septal wall thickness was normal. Normal left ventricular posterior wall thickness. There is no left ventricular hypertrophy. 2. The left ventricle has no regional wall motion abnormalities. 3. Global right ventricle has normal systolic function.The right ventricular size is normal. No increase in right ventricular wall thickness. 4. Left atrial size was normal. 5. Right atrial size was normal. 6. The mitral valve is grossly normal. Trivial mitral valve regurgitation. 7. The tricuspid valve is grossly normal. Tricuspid valve regurgitation is trivial. 8. The aortic valve was not well visualized. Aortic valve regurgitation is not visualized. 9. The pulmonic valve was not well visualized. Pulmonic valve regurgitation is trivial. 10. The aortic root was not well visualized. 11. Moderately elevated  pulmonary artery systolic pressure. 12. The atrial septum is grossly normal.  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  Bradycardia  IMproved off of b blocker  CONtinue tele  2   Acute on chronic distlic CHF   Contijnue IV lasix   Diuese  Cr stable   For questions or updates, please contact [facility] Please consult [email or web address] for contact info under        Signed, Dorris Carnes, MD  02/15/2019, 9:49 AM

## 2019-02-15 NOTE — Progress Notes (Signed)
PROGRESS NOTE                                                                                                                                                                                                             Patient Demographics:    Karen [name], is a 73 y.o. female, DOB - [birth year], [id number]  Admit date - 02/13/2019   Admitting Physician Nicolette Bang, DO  Outpatient Primary MD for the patient is White, Orlene Och, NP  LOS - 2    Chief Complaint  Patient presents with  . Bradycardia  . Shortness of Breath  . Back Pain       Brief Narrative 72 year old female with history of diastolic CHF, COPD on 3 L home oxygen, type 2 diabetes mellitus sent from urgent care with shortness of breath for past few days and leg swellings.  Patient also reports worsening of her low back pain In the ED she was found to be in acute CHF along with  junctional bradycardia.  Also found to have acute kidney injury.  Subjective Reports of breathing continues to improve slowly still not at baseline.  Negative balance of 5 l since admission.    Assessment  & Plan :   Principal problem acute on chronic diastolic CHF ([place]) Presenting with >5 pound weight gain and worsened lower extremity edema.  Elevated BNP.  Chest x-ray with interstitial edema.  Echo done 5 months back with EF of 50-55%. Continue IV Lasix 40 mg every 12 hours.  Diuresing quite well.  Cardiology consult appreciated and continue current regimen. Normal EF and no wall motion abnormality.  Active problems Junctional bradycardia Was on bisoprolol previously and had junctional bradycardia in June this year.  Was discontinued but patient unknowingly resumed it.  Now off beta-blocker and heart rate stable.  Cardiology consult recommends avoiding AV nodal blocking agents.  Essential hypertension Soft blood pressure.  HCTZ-losartan  held  Acute kidney injury on chronic kidney disease stage IIIa ?  Cardiorenal.  HCTZ and losartan held.  Renal function normalized with IV Lasix.  COPD with chronic hypoxic respiratory failure No acute exacerbation.  Continue home inhaler  Type 2 diabetes mellitus with hyperglycemia, uncontrolled On insulin pump at home which is resumed.  Code Status : DNR  Family Communication  : None  Disposition Plan  : Home  possibly tomorrow if continues to diurese well and improving.  Barriers For Discharge : Active symptoms  Consults  : Cardiology  Procedures  : 2D echo  DVT Prophylaxis  :  Lovenox -   Lab Results  Component Value Date   PLT 245 02/13/2019    Antibiotics  :    Anti-infectives (From admission, onward)   None        Objective:   Vitals:   02/14/19 1836 02/14/19 1920 02/15/19 0527 02/15/19 0910  BP: (!) 124/50 (!) 155/53 (!) 158/65 (!) 125/44  Pulse: 79 98 70 74  Resp: 16   18  Temp: 98 F (36.7 C) 97.6 F (36.4 C) 97.8 F (36.6 C) 97.9 F (36.6 C)  TempSrc: Oral Oral Oral   SpO2: 92% 96% 96% 93%  Weight:   95.8 kg   Height:        Wt Readings from Last 3 Encounters:  02/15/19 95.8 kg  02/13/19 95.3 kg  09/30/18 95.6 kg     Intake/Output Summary (Last 24 hours) at 02/15/2019 1420 Last data filed at 02/15/2019 1119 Gross per 24 hour  Intake 240 ml  Output 2950 ml  Net -2710 ml    Physical exam Not in distress HEENT: Moist with a, supple neck Chest: Improved breath sounds bilaterally, few rhonchi CVs: Normal S1-S2 GI: Soft, nondistended, nontender Musculoskeletal: Warm, improved trace pitting edema      Data Review:    CBC Recent Labs  Lab 02/13/19 1619  WBC 13.1*  HGB 10.3*  HCT 31.6*  PLT 245  MCV 88.0  MCH 28.7  MCHC 32.6  RDW 15.8*    Chemistries  Recent Labs  Lab 02/13/19 1619 02/14/19 0517 02/15/19 0418  NA 136 137 139  K 5.7* 4.4 3.6  CL 99 98 96*  CO2 24 27 32  GLUCOSE 280* 258* 136*  BUN 72* 65* 40*   CREATININE 2.25* 1.43* 0.83  CALCIUM 8.9 8.9 9.0   ------------------------------------------------------------------------------------------------------------------ No results for input(s): CHOL, HDL, LDLCALC, TRIG, CHOLHDL, LDLDIRECT in the last 72 hours.  Lab Results  Component Value Date   HGBA1C 7.7 (H) 06/25/2018   ------------------------------------------------------------------------------------------------------------------ No results for input(s): TSH, T4TOTAL, T3FREE, THYROIDAB in the last 72 hours.  Invalid input(s): FREET3 ------------------------------------------------------------------------------------------------------------------ No results for input(s): VITAMINB12, FOLATE, FERRITIN, TIBC, IRON, RETICCTPCT in the last 72 hours.  Coagulation profile No results for input(s): INR, PROTIME in the last 168 hours.  No results for input(s): DDIMER in the last 72 hours.  Cardiac Enzymes No results for input(s): CKMB, TROPONINI, MYOGLOBIN in the last 168 hours.  Invalid input(s): CK ------------------------------------------------------------------------------------------------------------------    Component Value Date/Time   BNP 622.0 (H) 02/13/2019 1619    Inpatient Medications  Scheduled Meds: . atorvastatin  10 mg Oral Daily  . darifenacin  15 mg Oral Daily  . enoxaparin (LOVENOX) injection  40 mg Subcutaneous Q24H  . ferrous sulfate  325 mg Oral Daily  . fluticasone furoate-vilanterol  1 puff Inhalation Daily   And  . umeclidinium bromide  1 puff Inhalation Daily  . furosemide  40 mg Intravenous BID  . insulin pump   Subcutaneous TID WC, HS, 0200  . ipratropium-albuterol  3 mL Inhalation TID  . lidocaine  1 patch Transdermal Q24H  . montelukast  10 mg Oral QHS  . sodium chloride flush  3 mL Intravenous Q12H  . traZODone  50 mg Oral QHS  . vitamin B-12  500 mcg Oral Daily   Continuous Infusions: . sodium  chloride     PRN Meds:.sodium chloride,  acetaminophen, albuterol, ondansetron (ZOFRAN) IV, sodium chloride flush  Micro Results Recent Results (from the past 240 hour(s))  SARS CORONAVIRUS 2 (TAT 6-24 HRS) Nasopharyngeal Nasopharyngeal Swab     Status: None   Collection Time: 02/13/19  7:25 PM   Specimen: Nasopharyngeal Swab  Result Value Ref Range Status   SARS Coronavirus 2 NEGATIVE NEGATIVE Final    Comment: (NOTE) SARS-CoV-2 target nucleic acids are NOT DETECTED. The SARS-CoV-2 RNA is generally detectable in upper and lower respiratory specimens during the acute phase of infection. Negative results do not preclude SARS-CoV-2 infection, do not rule out co-infections with other pathogens, and should not be used as the sole basis for treatment or other patient management decisions. Negative results must be combined with clinical observations, patient history, and epidemiological information. The expected result is Negative. Fact Sheet for Patients: SugarRoll.be Fact Sheet for Healthcare Providers: https://www.woods-mathews.com/ This test is not yet approved or cleared by the [place] FDA and  has been authorized for detection and/or diagnosis of SARS-CoV-2 by FDA under an Emergency Use Authorization (EUA). This EUA will remain  in effect (meaning this test can be used) for the duration of the COVID-19 declaration under Section 56 4(b)(1) of the Act, 21 U.S.C. section 360bbb-3(b)(1), unless the authorization is terminated or revoked sooner. Performed at [facility], [place] [address], [place], [place] [address]     Radiology Reports DG Chest 2 View  Result Date: 02/13/2019 CLINICAL DATA:  Shortness of breath, history of CHF and COPD EXAM: CHEST - 2 VIEW COMPARISON:  Radiograph 08/05/2018, CT 06/23/2016 FINDINGS: Chronic lung hyperinflation with flattening of the diaphragms. There are basilar predominant hazy interstitial opacities with fissural and septal thickening.  Suspect trace bilateral effusions. Mild cardiomegaly is similar to comparison portable radiograph. The aorta is calcified. No pneumothorax. No acute osseous or soft tissue abnormality. Degenerative changes are present in the imaged spine and shoulders. IMPRESSION: Features suggestive of CHF with interstitial edema, cardiomegaly and probable trace bilateral effusions. Aortic Atherosclerosis (ICD10-I70.0). Electronically Signed   By: Lovena Le M.D.   On: 02/13/2019 16:42   ECHOCARDIOGRAM COMPLETE  Result Date: 02/14/2019   ECHOCARDIOGRAM REPORT   Patient Name:   NEELA BERKLAND Date of Exam: 02/14/2019 Medical Rec #:  [id number]        Height:       63.0 in Accession #:    [id number]       Weight:       220.0 lb Date of Birth:  Apr 16, 1945       BSA:          2.01 m Patient Age:    30 years         BP:           167/75 mmHg Patient Gender: F                HR:           63 bpm. Exam Location:  [facility] Procedure: 2D Echo, Color Doppler and Cardiac Doppler Indications:     I48.91 Atrial fibrillation  History:         Patient has no prior history of Echocardiogram examinations.                  Risk Factors:Hypertension.  Sonographer:     Charmayne Sheer RDCS (AE) Referring Phys:  [id number] Nicolette Bang Diagnosing Phys: Bartholome Bill MD  Sonographer Comments: Suboptimal  apical window. IMPRESSIONS  1. Left ventricular ejection fraction, by visual estimation, is 65 to 70%. The left ventricle has normal function. Left ventricular septal wall thickness was normal. Normal left ventricular posterior wall thickness. There is no left ventricular hypertrophy.  2. The left ventricle has no regional wall motion abnormalities.  3. Global right ventricle has normal systolic function.The right ventricular size is normal. No increase in right ventricular wall thickness.  4. Left atrial size was normal.  5. Right atrial size was normal.  6. The mitral valve is grossly normal. Trivial mitral valve regurgitation.  7. The  tricuspid valve is grossly normal. Tricuspid valve regurgitation is trivial.  8. The aortic valve was not well visualized. Aortic valve regurgitation is not visualized.  9. The pulmonic valve was not well visualized. Pulmonic valve regurgitation is trivial. 10. The aortic root was not well visualized. 11. Moderately elevated pulmonary artery systolic pressure. 12. The atrial septum is grossly normal. FINDINGS  Left Ventricle: Left ventricular ejection fraction, by visual estimation, is 65 to 70%. The left ventricle has normal function. The left ventricle has no regional wall motion abnormalities. Normal left ventricular posterior wall thickness. There is no left ventricular hypertrophy. Right Ventricle: The right ventricular size is normal. No increase in right ventricular wall thickness. Global RV systolic function is has normal systolic function. The tricuspid regurgitant velocity is 3.41 m/s, and with an assumed right atrial pressure  of 10 mmHg, the estimated right ventricular systolic pressure is moderately elevated at 56.5 mmHg. Left Atrium: Left atrial size was normal in size. Right Atrium: Right atrial size was normal in size Pericardium: There is no evidence of pericardial effusion. Mitral Valve: The mitral valve is grossly normal. Trivial mitral valve regurgitation. MV peak gradient, 8.4 mmHg. Tricuspid Valve: The tricuspid valve is grossly normal. Tricuspid valve regurgitation is trivial. Aortic Valve: The aortic valve was not well visualized. Aortic valve regurgitation is not visualized. Aortic valve mean gradient measures 13.0 mmHg. Aortic valve peak gradient measures 20.6 mmHg. Aortic valve area, by VTI measures 1.29 cm. Pulmonic Valve: The pulmonic valve was not well visualized. Pulmonic valve regurgitation is trivial. Pulmonic regurgitation is trivial. Aorta: The aortic root was not well visualized. IAS/Shunts: The atrial septum is grossly normal.  LEFT VENTRICLE PLAX 2D LVIDd:         4.70 cm   Diastology LVIDs:         2.78 cm  LV e' lateral:   7.51 cm/s LV PW:         0.83 cm  LV E/e' lateral: 12.6 LV IVS:        0.82 cm  LV e' medial:    5.33 cm/s LVOT diam:     1.70 cm  LV E/e' medial:  17.7 LV SV:         73 ml LV SV Index:   34.00 LVOT Area:     2.27 cm  RIGHT VENTRICLE RV Basal diam:  3.51 cm LEFT ATRIUM             Index       RIGHT ATRIUM           Index LA diam:        4.50 cm 2.23 cm/m  RA Area:     15.80 cm LA Vol (A2C):   58.6 ml 29.10 ml/m RA Volume:   38.20 ml  18.97 ml/m LA Vol (A4C):   73.7 ml 36.60 ml/m LA Biplane Vol: 67.6 ml 33.57 ml/m  AORTIC VALVE                    PULMONIC VALVE AV Area (Vmax):    1.14 cm     PV Vmax:       1.42 m/s AV Area (Vmean):   1.08 cm     PV Vmean:      94.700 cm/s AV Area (VTI):     1.29 cm     PV VTI:        0.333 m AV Vmax:           227.00 cm/s  PV Peak grad:  8.1 mmHg AV Vmean:          172.000 cm/s PV Mean grad:  4.0 mmHg AV VTI:            0.540 m AV Peak Grad:      20.6 mmHg AV Mean Grad:      13.0 mmHg LVOT Vmax:         114.00 cm/s LVOT Vmean:        81.500 cm/s LVOT VTI:          0.306 m LVOT/AV VTI ratio: 0.57  AORTA Ao Root diam: 2.80 cm MITRAL VALVE                         TRICUSPID VALVE MV Area (PHT): 3.48 cm              TR Peak grad:   46.5 mmHg MV Peak grad:  8.4 mmHg              TR Vmax:        341.00 cm/s MV Mean grad:  2.0 mmHg MV Vmax:       1.45 m/s              SHUNTS MV Vmean:      68.8 cm/s             Systemic VTI:  0.31 m MV VTI:        0.39 m                Systemic Diam: 1.70 cm MV PHT:        63.22 msec MV Decel Time: 218 msec MV E velocity: 94.60 cm/s  103 cm/s MV A velocity: 121.00 cm/s 70.3 cm/s MV E/A ratio:  0.78        1.5  Bartholome Bill MD Electronically signed by Bartholome Bill MD Signature Date/Time: 02/14/2019/2:42:14 PM    Final     Time Spent in minutes 35   Ayano Douthitt M.D on 02/15/2019 at 2:20 PM  Between 7am to 7pm - Pager - [phone]  After 7pm go to [email or web address] - password  [facility]  [facility] -  Office  [phone]

## 2019-02-16 DIAGNOSIS — E0865 Diabetes mellitus due to underlying condition with hyperglycemia: Secondary | ICD-10-CM

## 2019-02-16 LAB — BASIC METABOLIC PANEL
Anion gap: 11 (ref 5–15)
BUN: 29 mg/dL — ABNORMAL HIGH (ref 8–23)
CO2: 33 mmol/L — ABNORMAL HIGH (ref 22–32)
Calcium: 9.2 mg/dL (ref 8.9–10.3)
Chloride: 91 mmol/L — ABNORMAL LOW (ref 98–111)
Creatinine, Ser: 0.93 mg/dL (ref 0.44–1.00)
GFR calc Af Amer: >60 mL/min (ref >60)
GFR calc non Af Amer: >60 mL/min (ref >60)
Glucose, Bld: 208 mg/dL — ABNORMAL HIGH (ref 70–99)
Potassium: 4.4 mmol/L (ref 3.5–5.1)
Sodium: 135 mmol/L (ref 135–145)

## 2019-02-16 LAB — GLUCOSE, CAPILLARY
Glucose-Capillary: 183 mg/dL — ABNORMAL HIGH (ref 70–99)
Glucose-Capillary: 227 mg/dL — ABNORMAL HIGH (ref 70–99)
Glucose-Capillary: 339 mg/dL — ABNORMAL HIGH (ref 70–99)

## 2019-02-16 MED ORDER — INSULIN ASPART 100 UNIT/ML ~~LOC~~ SOLN
0.0000 [IU] | Freq: Three times a day (TID) | SUBCUTANEOUS | Status: DC
  Administered 2019-02-16: 12:00:00 11 [IU] via SUBCUTANEOUS
  Filled 2019-02-16: qty 1

## 2019-02-16 MED ORDER — INSULIN ASPART 100 UNIT/ML ~~LOC~~ SOLN: 0.0000 [IU] | Freq: Every day | SUBCUTANEOUS | Status: DC

## 2019-02-16 NOTE — Progress Notes (Signed)
Pt discharged home via private car at 1500. Pt was A&Ox4. AVS reviewed with pt and all questions were answered. Pt meds returned to her. Pt with clothes, cell phone, and charger. Pt wheeled downstairs by the RN and the NT.

## 2019-02-16 NOTE — Progress Notes (Addendum)
Progress Note  Patient Name: Karen [name] Date of Encounter: 02/16/2019  Primary Cardiologist: Ida Rogue, MD   Subjective     Patient sick (nauseated) last night at 1 AM   Better now   Breathign at baseline  No CP    Inpatient Medications    Scheduled Meds: . atorvastatin  10 mg Oral Daily  . darifenacin  15 mg Oral Daily  . enoxaparin (LOVENOX) injection  40 mg Subcutaneous Q24H  . ferrous sulfate  325 mg Oral Daily  . fluticasone  2 spray Each Nare Daily  . fluticasone furoate-vilanterol  1 puff Inhalation Daily   And  . umeclidinium bromide  1 puff Inhalation Daily  . furosemide  40 mg Intravenous BID  . insulin pump   Subcutaneous TID WC, HS, 0200  . ipratropium-albuterol  3 mL Inhalation TID  . lidocaine  1 patch Transdermal Q24H  . montelukast  10 mg Oral QHS  . sodium chloride flush  3 mL Intravenous Q12H  . traZODone  50 mg Oral QHS  . vitamin B-12  500 mcg Oral Daily   Continuous Infusions: . sodium chloride     PRN Meds: sodium chloride, acetaminophen, albuterol, ondansetron (ZOFRAN) IV, sodium chloride flush   Vital Signs    Vitals:   02/15/19 2007 02/16/19 0511 02/16/19 0512 02/16/19 0741  BP:   (!) 131/58 (!) 117/58  Pulse:   70 76  Resp:   18 19  Temp:   97.7 F (36.5 C) 98.1 F (36.7 C)  TempSrc:   Oral Oral  SpO2: 94%  95% 93%  Weight:  92.6 kg    Height:        Intake/Output Summary (Last 24 hours) at 02/16/2019 0951 Last data filed at 02/16/2019 0511 Gross per 24 hour  Intake 240 ml  Output 4550 ml  Net -4310 ml   Net neg 8.8  L   Last 3 Weights 02/16/2019 02/15/2019 02/14/2019  Weight (lbs) 204 lb 2.3 oz 211 lb 3.2 oz 212 lb 14.4 oz  Weight (kg) 92.6 kg 95.8 kg 96.571 kg      Telemetry    SR with freq PACs      - Personally Reviewed  ECG    Not done    - Personally Reviewed  Physical Exam   GEN: No acute distress.   Neck: JVP is normal   Cardiac: RRR, no murmurs, rubs, or gallops.  Respiratory:CTA  ON 2L  [place]   GI: Soft, nontender, non-distended  MS: No edema; No deformity. Neuro:  Nonfocal  Psych: Normal affect   Labs    High Sensitivity Troponin:   Recent Labs  Lab 02/13/19 1619 02/13/19 1820  TROPONINIHS 6 6      Chemistry Recent Labs  Lab 02/14/19 0517 02/15/19 0418 02/16/19 0431  NA 137 139 135  K 4.4 3.6 4.4  CL 98 96* 91*  CO2 27 32 33*  GLUCOSE 258* 136* 208*  BUN 65* 40* 29*  CREATININE 1.43* 0.83 0.93  CALCIUM 8.9 9.0 9.2  GFRNONAA 36* >60 >60  GFRAA 42* >60 >60  ANIONGAP 12 11 11      Hematology Recent Labs  Lab 02/13/19 1619  WBC 13.1*  RBC 3.59*  HGB 10.3*  HCT 31.6*  MCV 88.0  MCH 28.7  MCHC 32.6  RDW 15.8*  PLT 245    BNP Recent Labs  Lab 02/13/19 1619  BNP 622.0*     DDimer No results for input(s): DDIMER in  the last 168 hours.   Radiology    ECHOCARDIOGRAM COMPLETE  Result Date: 02/14/2019   ECHOCARDIOGRAM REPORT   Patient Name:   SAMNANG PILI Date of Exam: 02/14/2019 Medical Rec #:  [id number]        Height:       63.0 in Accession #:    [id number]       Weight:       220.0 lb Date of Birth:  [birth year]       BSA:          2.01 m Patient Age:    73 years         BP:           167/75 mmHg Patient Gender: F                HR:           63 bpm. Exam Location:  [facility] Procedure: 2D Echo, Color Doppler and Cardiac Doppler Indications:     I48.91 Atrial fibrillation  History:         Patient has no prior history of Echocardiogram examinations.                  Risk Factors:Hypertension.  Sonographer:     Charmayne Sheer RDCS (AE) Referring Phys:  [id number] Nicolette Bang Diagnosing Phys: Bartholome Bill MD  Sonographer Comments: Suboptimal apical window. IMPRESSIONS  1. Left ventricular ejection fraction, by visual estimation, is 65 to 70%. The left ventricle has normal function. Left ventricular septal wall thickness was normal. Normal left ventricular posterior wall thickness. There is no left ventricular hypertrophy.  2. The left ventricle  has no regional wall motion abnormalities.  3. Global right ventricle has normal systolic function.The right ventricular size is normal. No increase in right ventricular wall thickness.  4. Left atrial size was normal.  5. Right atrial size was normal.  6. The mitral valve is grossly normal. Trivial mitral valve regurgitation.  7. The tricuspid valve is grossly normal. Tricuspid valve regurgitation is trivial.  8. The aortic valve was not well visualized. Aortic valve regurgitation is not visualized.  9. The pulmonic valve was not well visualized. Pulmonic valve regurgitation is trivial. 10. The aortic root was not well visualized. 11. Moderately elevated pulmonary artery systolic pressure. 12. The atrial septum is grossly normal. FINDINGS  Left Ventricle: Left ventricular ejection fraction, by visual estimation, is 65 to 70%. The left ventricle has normal function. The left ventricle has no regional wall motion abnormalities. Normal left ventricular posterior wall thickness. There is no left ventricular hypertrophy. Right Ventricle: The right ventricular size is normal. No increase in right ventricular wall thickness. Global RV systolic function is has normal systolic function. The tricuspid regurgitant velocity is 3.41 m/s, and with an assumed right atrial pressure  of 10 mmHg, the estimated right ventricular systolic pressure is moderately elevated at 56.5 mmHg. Left Atrium: Left atrial size was normal in size. Right Atrium: Right atrial size was normal in size Pericardium: There is no evidence of pericardial effusion. Mitral Valve: The mitral valve is grossly normal. Trivial mitral valve regurgitation. MV peak gradient, 8.4 mmHg. Tricuspid Valve: The tricuspid valve is grossly normal. Tricuspid valve regurgitation is trivial. Aortic Valve: The aortic valve was not well visualized. Aortic valve regurgitation is not visualized. Aortic valve mean gradient measures 13.0 mmHg. Aortic valve peak gradient measures 20.6  mmHg. Aortic valve area, by VTI measures 1.29 cm. Pulmonic Valve: The pulmonic valve was  not well visualized. Pulmonic valve regurgitation is trivial. Pulmonic regurgitation is trivial. Aorta: The aortic root was not well visualized. IAS/Shunts: The atrial septum is grossly normal.  LEFT VENTRICLE PLAX 2D LVIDd:         4.70 cm  Diastology LVIDs:         2.78 cm  LV e' lateral:   7.51 cm/s LV PW:         0.83 cm  LV E/e' lateral: 12.6 LV IVS:        0.82 cm  LV e' medial:    5.33 cm/s LVOT diam:     1.70 cm  LV E/e' medial:  17.7 LV SV:         73 ml LV SV Index:   34.00 LVOT Area:     2.27 cm  RIGHT VENTRICLE RV Basal diam:  3.51 cm LEFT ATRIUM             Index       RIGHT ATRIUM           Index LA diam:        4.50 cm 2.23 cm/m  RA Area:     15.80 cm LA Vol (A2C):   58.6 ml 29.10 ml/m RA Volume:   38.20 ml  18.97 ml/m LA Vol (A4C):   73.7 ml 36.60 ml/m LA Biplane Vol: 67.6 ml 33.57 ml/m  AORTIC VALVE                    PULMONIC VALVE AV Area (Vmax):    1.14 cm     PV Vmax:       1.42 m/s AV Area (Vmean):   1.08 cm     PV Vmean:      94.700 cm/s AV Area (VTI):     1.29 cm     PV VTI:        0.333 m AV Vmax:           227.00 cm/s  PV Peak grad:  8.1 mmHg AV Vmean:          172.000 cm/s PV Mean grad:  4.0 mmHg AV VTI:            0.540 m AV Peak Grad:      20.6 mmHg AV Mean Grad:      13.0 mmHg LVOT Vmax:         114.00 cm/s LVOT Vmean:        81.500 cm/s LVOT VTI:          0.306 m LVOT/AV VTI ratio: 0.57  AORTA Ao Root diam: 2.80 cm MITRAL VALVE                         TRICUSPID VALVE MV Area (PHT): 3.48 cm              TR Peak grad:   46.5 mmHg MV Peak grad:  8.4 mmHg              TR Vmax:        341.00 cm/s MV Mean grad:  2.0 mmHg MV Vmax:       1.45 m/s              SHUNTS MV Vmean:      68.8 cm/s             Systemic VTI:  0.31 m MV VTI:  0.39 m                Systemic Diam: 1.70 cm MV PHT:        63.22 msec MV Decel Time: 218 msec MV E velocity: 94.60 cm/s  103 cm/s MV A velocity: 121.00 cm/s  70.3 cm/s MV E/A ratio:  0.78        1.5  Bartholome Bill MD Electronically signed by Bartholome Bill MD Signature Date/Time: 02/14/2019/2:42:14 PM    Final     Cardiac Studies   Echo 02/14/19 1. Left ventricular ejection fraction, by visual estimation, is 65 to 70%. The left ventricle has normal function. Left ventricular septal wall thickness was normal. Normal left ventricular posterior wall thickness. There is no left ventricular hypertrophy. 2. The left ventricle has no regional wall motion abnormalities. 3. Global right ventricle has normal systolic function.The right ventricular size is normal. No increase in right ventricular wall thickness. 4. Left atrial size was normal. 5. Right atrial size was normal. 6. The mitral valve is grossly normal. Trivial mitral valve regurgitation. 7. The tricuspid valve is grossly normal. Tricuspid valve regurgitation is trivial. 8. The aortic valve was not well visualized. Aortic valve regurgitation is not visualized. 9. The pulmonic valve was not well visualized. Pulmonic valve regurgitation is trivial. 10. The aortic root was not well visualized. 11. Moderately elevated pulmonary artery systolic pressure. 12. The atrial septum is grossly normal.  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  Bradycardia    REsolved off of b blocker   She has PACs  With hx of sensitivity I would not resume agents that slow    Follow as outpt    2   Acute on chronic distlic CHF   Volume looks pretty good   Pt comfortable    I think it is OK for her to go home  Switch lasix to previous home does  WIll make sure she hs f/u in clinic    For questions or updates, please  contact [facility] Please consult [email or web address] for contact info under        Signed, Dorris Carnes, MD  02/16/2019, 9:51 AM

## 2019-02-16 NOTE — Discharge Summary (Signed)
Physician Discharge Summary  Karen [name] [id number] DOB: October 26, 1945 DOA: 02/13/2019  PCP: Ricardo Jericho, NP  Admit date: 02/13/2019 Discharge date: 02/16/2019  Admitted From: Home Disposition: Home  Recommendations for Outpatient Follow-up:  1. Follow up with cardiology in 1 week (has appointment for 12/21).  Patient should not be on any AV nodal blocking agents for issues with bradycardia..  Home Health: None, outpatient cardiac and pulmonary rehab Equipment/Devices: Oxygen (3 L chronic)  Discharge Condition: Fair CODE STATUS: DNR Diet recommendation: Heart Healthy / Carb Modified  Discharge weight 92.6 kg  Discharge Diagnoses:  Principal problem Acute on chronic diastolic CHF ([place])   Active Problems:   COPD (chronic obstructive pulmonary disease) (HCC)   Diabetes mellitus type 2, uncontrolled with hyperglycemia (HCC) Essential hypertension   Junctional bradycardia   AKI (acute kidney injury) ([place])  Brief narrative/HPI 73 year old female with history of diastolic CHF, COPD on 3 L home oxygen, type 2 diabetes mellitus sent from urgent care with shortness of breath for past few days and leg swellings.  Patient also reports worsening of her low back pain In the ED she was found to be in acute CHF along with  junctional bradycardia.  Also found to have acute kidney injury.  Hospital course  Principal problem acute on chronic diastolic CHF ([place]) Presenting with >5 pound weight gain and worsened lower extremity edema.  Elevated BNP.  Chest x-ray with interstitial edema.  Echo done 5 months back with EF of 50-55%. Placed on IV Lasix 40 mg every 12 hours with excellent diuresis.  Negative balance of almost 9 L since admission.  Dyspnea markedly improved and appears at baseline weight. 2D echo repeated showing normal EF with no wall motion abnormality.  Cardiology consult appreciated and recommends discharging her home on current home diuretic regimen.   Has outpatient follow-up with her cardiologist next week. Home health heart failure instructions provided.  Active problems Junctional bradycardia Was on bisoprolol previously and had junctional bradycardia in June this year.  Was discontinued but patient unknowingly resumed it.  Now off beta-blocker and heart rate stable.  Cardiology consult recommends avoiding AV nodal blocking agents.  Essential hypertension Blood pressure soft on presentation.  HCTZ and losartan were held, resumed upon discharge.  Acute kidney injury on chronic kidney disease stage IIIa Suspect cardiorenal.  Renal function normalized with IV Lasix.  Resume HCTZ and losartan.  COPD with chronic hypoxic respiratory failure No acute exacerbation.  Continue home inhaler  Type 2 diabetes mellitus with hyperglycemia, uncontrolled On insulin pump at home which is resumed.  Continue Metformin and Invokana.    Family Communication  : None  Disposition Plan  : Home   Consults  : Cardiology  Procedures  : 2D echo Discharge Instructions  Discharge Instructions    Ambulatory referral to Physical Therapy   Complete by: As directed    Cardiac and pulmonary rehab     Allergies as of 02/16/2019      Reactions   Ace Inhibitors Hives   Beta Adrenergic Blockers    Junctional bradycardia   Gabapentin Hives   Lisinopril Hives   Lyrica [pregabalin] Hives   Shrimp [shellfish Allergy] Swelling   Swelling of the lips      Medication List    STOP taking these medications   cyclobenzaprine 10 MG tablet Commonly known as: FLEXERIL     TAKE these medications   albuterol 108 (90 Base) MCG/ACT inhaler Commonly known as: VENTOLIN HFA INHALE 2 PUFFS BY MOUTH  EVERY 6 HOURS AS NEEDED FOR WHEEZING   atorvastatin 10 MG tablet Commonly known as: LIPITOR Take 1 tablet by mouth daily.   esomeprazole 40 MG capsule Commonly known as: NEXIUM Take 40 mg by mouth daily.   fluticasone 50 MCG/ACT nasal  spray Commonly known as: FLONASE Place 2 sprays into both nostrils daily.   Fluticasone-Umeclidin-Vilant 100-62.5-25 MCG/INH Aepb Inhale 1 puff into the lungs daily.   furosemide 40 MG tablet Commonly known as: LASIX Take 1 tablet (40 mg total) by mouth 2 (two) times daily.   insulin pump Soln Inject 1 each into the skin 3 times daily with meals, bedtime and 2 AM. What changed: additional instructions   Invokana 100 MG Tabs tablet Generic drug: canagliflozin Take 1 tablet by mouth daily.   ipratropium-albuterol 0.5-2.5 (3) MG/3ML Soln Commonly known as: DUONEB Inhale 3 mLs into the lungs 4 (four) times daily.   Iron 325 (65 Fe) MG Tabs Take 1 tablet by mouth daily.   losartan-hydrochlorothiazide 100-25 MG tablet Commonly known as: HYZAAR Take 1 tablet by mouth daily.   metFORMIN 500 MG tablet Commonly known as: GLUCOPHAGE Take 1,000 mg by mouth 2 (two) times daily with a meal.   montelukast 10 MG tablet Commonly known as: SINGULAIR Take 10 mg by mouth at bedtime.   solifenacin 10 MG tablet Commonly known as: VESICARE Take 1 tablet (10 mg total) by mouth daily.   vitamin B-12 500 MCG tablet Commonly known as: CYANOCOBALAMIN Take 500 mcg by mouth daily.      Follow-up Information    [facility] Follow up on 02/24/2019.   Specialty: Cardiology Why: at 11:00am. Enter through the [place] entrance Contact information: [address] [place]         Allergies  Allergen Reactions  . Ace Inhibitors Hives  . Beta Adrenergic Blockers     Junctional bradycardia  . Gabapentin Hives  . Lisinopril Hives  . Lyrica [Pregabalin] Hives  . Shrimp [Shellfish Allergy] Swelling    Swelling of the lips     Procedures/Studies: DG Chest 2 View  Result Date: 02/13/2019 CLINICAL DATA:  Shortness of breath, history of CHF and COPD EXAM: CHEST - 2 VIEW COMPARISON:   Radiograph 08/05/2018, CT 06/23/2016 FINDINGS: Chronic lung hyperinflation with flattening of the diaphragms. There are basilar predominant hazy interstitial opacities with fissural and septal thickening. Suspect trace bilateral effusions. Mild cardiomegaly is similar to comparison portable radiograph. The aorta is calcified. No pneumothorax. No acute osseous or soft tissue abnormality. Degenerative changes are present in the imaged spine and shoulders. IMPRESSION: Features suggestive of CHF with interstitial edema, cardiomegaly and probable trace bilateral effusions. Aortic Atherosclerosis (ICD10-I70.0). Electronically Signed   By: Lovena Le M.D.   On: 02/13/2019 16:42   ECHOCARDIOGRAM COMPLETE  Result Date: 02/14/2019   ECHOCARDIOGRAM REPORT   Patient Name:   Karen [name] Date of Exam: 02/14/2019 Medical Rec #:  [id number]        Height:       63.0 in Accession #:    [id number]       Weight:       220.0 lb Date of Birth:  1945-12-05       BSA:          2.01 m Patient Age:    23 years         BP:           167/75  mmHg Patient Gender: F                HR:           63 bpm. Exam Location:  [facility] Procedure: 2D Echo, Color Doppler and Cardiac Doppler Indications:     I48.91 Atrial fibrillation  History:         Patient has no prior history of Echocardiogram examinations.                  Risk Factors:Hypertension.  Sonographer:     Charmayne Sheer RDCS (AE) Referring Phys:  [id number] Nicolette Bang Diagnosing Phys: Bartholome Bill MD  Sonographer Comments: Suboptimal apical window. IMPRESSIONS  1. Left ventricular ejection fraction, by visual estimation, is 65 to 70%. The left ventricle has normal function. Left ventricular septal wall thickness was normal. Normal left ventricular posterior wall thickness. There is no left ventricular hypertrophy.  2. The left ventricle has no regional wall motion abnormalities.  3. Global right ventricle has normal systolic function.The right ventricular size is normal. No  increase in right ventricular wall thickness.  4. Left atrial size was normal.  5. Right atrial size was normal.  6. The mitral valve is grossly normal. Trivial mitral valve regurgitation.  7. The tricuspid valve is grossly normal. Tricuspid valve regurgitation is trivial.  8. The aortic valve was not well visualized. Aortic valve regurgitation is not visualized.  9. The pulmonic valve was not well visualized. Pulmonic valve regurgitation is trivial. 10. The aortic root was not well visualized. 11. Moderately elevated pulmonary artery systolic pressure. 12. The atrial septum is grossly normal. FINDINGS  Left Ventricle: Left ventricular ejection fraction, by visual estimation, is 65 to 70%. The left ventricle has normal function. The left ventricle has no regional wall motion abnormalities. Normal left ventricular posterior wall thickness. There is no left ventricular hypertrophy. Right Ventricle: The right ventricular size is normal. No increase in right ventricular wall thickness. Global RV systolic function is has normal systolic function. The tricuspid regurgitant velocity is 3.41 m/s, and with an assumed right atrial pressure  of 10 mmHg, the estimated right ventricular systolic pressure is moderately elevated at 56.5 mmHg. Left Atrium: Left atrial size was normal in size. Right Atrium: Right atrial size was normal in size Pericardium: There is no evidence of pericardial effusion. Mitral Valve: The mitral valve is grossly normal. Trivial mitral valve regurgitation. MV peak gradient, 8.4 mmHg. Tricuspid Valve: The tricuspid valve is grossly normal. Tricuspid valve regurgitation is trivial. Aortic Valve: The aortic valve was not well visualized. Aortic valve regurgitation is not visualized. Aortic valve mean gradient measures 13.0 mmHg. Aortic valve peak gradient measures 20.6 mmHg. Aortic valve area, by VTI measures 1.29 cm. Pulmonic Valve: The pulmonic valve was not well visualized. Pulmonic valve regurgitation  is trivial. Pulmonic regurgitation is trivial. Aorta: The aortic root was not well visualized. IAS/Shunts: The atrial septum is grossly normal.  LEFT VENTRICLE PLAX 2D LVIDd:         4.70 cm  Diastology LVIDs:         2.78 cm  LV e' lateral:   7.51 cm/s LV PW:         0.83 cm  LV E/e' lateral: 12.6 LV IVS:        0.82 cm  LV e' medial:    5.33 cm/s LVOT diam:     1.70 cm  LV E/e' medial:  17.7 LV SV:  73 ml LV SV Index:   34.00 LVOT Area:     2.27 cm  RIGHT VENTRICLE RV Basal diam:  3.51 cm LEFT ATRIUM             Index       RIGHT ATRIUM           Index LA diam:        4.50 cm 2.23 cm/m  RA Area:     15.80 cm LA Vol (A2C):   58.6 ml 29.10 ml/m RA Volume:   38.20 ml  18.97 ml/m LA Vol (A4C):   73.7 ml 36.60 ml/m LA Biplane Vol: 67.6 ml 33.57 ml/m  AORTIC VALVE                    PULMONIC VALVE AV Area (Vmax):    1.14 cm     PV Vmax:       1.42 m/s AV Area (Vmean):   1.08 cm     PV Vmean:      94.700 cm/s AV Area (VTI):     1.29 cm     PV VTI:        0.333 m AV Vmax:           227.00 cm/s  PV Peak grad:  8.1 mmHg AV Vmean:          172.000 cm/s PV Mean grad:  4.0 mmHg AV VTI:            0.540 m AV Peak Grad:      20.6 mmHg AV Mean Grad:      13.0 mmHg LVOT Vmax:         114.00 cm/s LVOT Vmean:        81.500 cm/s LVOT VTI:          0.306 m LVOT/AV VTI ratio: 0.57  AORTA Ao Root diam: 2.80 cm MITRAL VALVE                         TRICUSPID VALVE MV Area (PHT): 3.48 cm              TR Peak grad:   46.5 mmHg MV Peak grad:  8.4 mmHg              TR Vmax:        341.00 cm/s MV Mean grad:  2.0 mmHg MV Vmax:       1.45 m/s              SHUNTS MV Vmean:      68.8 cm/s             Systemic VTI:  0.31 m MV VTI:        0.39 m                Systemic Diam: 1.70 cm MV PHT:        63.22 msec MV Decel Time: 218 msec MV E velocity: 94.60 cm/s  103 cm/s MV A velocity: 121.00 cm/s 70.3 cm/s MV E/A ratio:  0.78        1.5  Bartholome Bill MD Electronically signed by Bartholome Bill MD Signature Date/Time: 02/14/2019/2:42:14  PM    Final        Subjective: Reported some nausea this morning.  Feels her breathing to be back to baseline.  Discharge Exam: Vitals:   02/16/19 0512 02/16/19 0741  BP: (!) 131/58 (!) 117/58  Pulse: 70  76  Resp: 18 19  Temp: 97.7 F (36.5 C) 98.1 F (36.7 C)  SpO2: 95% 93%   Vitals:   02/15/19 2007 02/16/19 0511 02/16/19 0512 02/16/19 0741  BP:   (!) 131/58 (!) 117/58  Pulse:   70 76  Resp:   18 19  Temp:   97.7 F (36.5 C) 98.1 F (36.7 C)  TempSrc:   Oral Oral  SpO2: 94%  95% 93%  Weight:  92.6 kg    Height:        General: Elderly female not in distress HEENT: Moist mucosa, supple neck Chest: Clear bilaterally  CVs: Normal S1-S2, no murmurs GI: Soft, nondistended, nontender Musculoskeletal: Warm, leg edema resolved     The results of significant diagnostics from this hospitalization (including imaging, microbiology, ancillary and laboratory) are listed below for reference.     Microbiology: Recent Results (from the past 240 hour(s))  SARS CORONAVIRUS 2 (TAT 6-24 HRS) Nasopharyngeal Nasopharyngeal Swab     Status: None   Collection Time: 02/13/19  7:25 PM   Specimen: Nasopharyngeal Swab  Result Value Ref Range Status   SARS Coronavirus 2 NEGATIVE NEGATIVE Final    Comment: (NOTE) SARS-CoV-2 target nucleic acids are NOT DETECTED. The SARS-CoV-2 RNA is generally detectable in upper and lower respiratory specimens during the acute phase of infection. Negative results do not preclude SARS-CoV-2 infection, do not rule out co-infections with other pathogens, and should not be used as the sole basis for treatment or other patient management decisions. Negative results must be combined with clinical observations, patient history, and epidemiological information. The expected result is Negative. Fact Sheet for Patients: SugarRoll.be Fact Sheet for Healthcare Providers: https://www.woods-mathews.com/ This test is  not yet approved or cleared by the [place] FDA and  has been authorized for detection and/or diagnosis of SARS-CoV-2 by FDA under an Emergency Use Authorization (EUA). This EUA will remain  in effect (meaning this test can be used) for the duration of the COVID-19 declaration under Section 56 4(b)(1) of the Act, 21 U.S.C. section 360bbb-3(b)(1), unless the authorization is terminated or revoked sooner. Performed at [facility], [place] [address], [place], [place] [address]      Labs: BNP (last 3 results) Recent Labs    08/05/18 1118 08/05/18 1624 02/13/19 1619  BNP 826.0* 831.0* [id number]*   Basic Metabolic Panel: Recent Labs  Lab 02/13/19 1619 02/14/19 0517 02/15/19 0418 02/16/19 0431  NA 136 137 139 135  K 5.7* 4.4 3.6 4.4  CL 99 98 96* 91*  CO2 24 27 32 33*  GLUCOSE 280* 258* 136* 208*  BUN 72* 65* 40* 29*  CREATININE 2.25* 1.43* 0.83 0.93  CALCIUM 8.9 8.9 9.0 9.2   Liver Function Tests: No results for input(s): AST, ALT, ALKPHOS, BILITOT, PROT, ALBUMIN in the last 168 hours. No results for input(s): LIPASE, AMYLASE in the last 168 hours. No results for input(s): AMMONIA in the last 168 hours. CBC: Recent Labs  Lab 02/13/19 1619  WBC 13.1*  HGB 10.3*  HCT 31.6*  MCV 88.0  PLT 245   Cardiac Enzymes: No results for input(s): CKTOTAL, CKMB, CKMBINDEX, TROPONINI in the last 168 hours. BNP: Invalid input(s): POCBNP CBG: Recent Labs  Lab 02/15/19 1612 02/15/19 2205 02/16/19 0117 02/16/19 0902 02/16/19 1104  GLUCAP 252* 148* 183* 227* 339*   D-Dimer No results for input(s): DDIMER in the last 72 hours. Hgb A1c No results for input(s): HGBA1C in the last 72 hours. Lipid Profile No results  for input(s): CHOL, HDL, LDLCALC, TRIG, CHOLHDL, LDLDIRECT in the last 72 hours. Thyroid function studies No results for input(s): TSH, T4TOTAL, T3FREE, THYROIDAB in the last 72 hours.  Invalid input(s): FREET3 Anemia work up No results for input(s):  VITAMINB12, FOLATE, FERRITIN, TIBC, IRON, RETICCTPCT in the last 72 hours. Urinalysis    Component Value Date/Time   COLORURINE YELLOW (A) 02/13/2019 1855   APPEARANCEUR CLOUDY (A) 02/13/2019 1855   APPEARANCEUR Cloudy (A) 09/28/2017 1338   LABSPEC 1.017 02/13/2019 1855   LABSPEC 1.024 04/02/2014 0517   PHURINE 5.0 02/13/2019 1855   GLUCOSEU NEGATIVE 02/13/2019 1855   GLUCOSEU >=500 04/02/2014 0517   HGBUR NEGATIVE 02/13/2019 1855   BILIRUBINUR NEGATIVE 02/13/2019 1855   BILIRUBINUR Negative 09/28/2017 1338   BILIRUBINUR Negative 04/02/2014 0517   KETONESUR NEGATIVE 02/13/2019 1855   PROTEINUR NEGATIVE 02/13/2019 1855   NITRITE NEGATIVE 02/13/2019 1855   LEUKOCYTESUR LARGE (A) 02/13/2019 1855   LEUKOCYTESUR Trace 04/02/2014 0517   Sepsis Labs Invalid input(s): PROCALCITONIN,  WBC,  LACTICIDVEN Microbiology Recent Results (from the past 240 hour(s))  SARS CORONAVIRUS 2 (TAT 6-24 HRS) Nasopharyngeal Nasopharyngeal Swab     Status: None   Collection Time: 02/13/19  7:25 PM   Specimen: Nasopharyngeal Swab  Result Value Ref Range Status   SARS Coronavirus 2 NEGATIVE NEGATIVE Final    Comment: (NOTE) SARS-CoV-2 target nucleic acids are NOT DETECTED. The SARS-CoV-2 RNA is generally detectable in upper and lower respiratory specimens during the acute phase of infection. Negative results do not preclude SARS-CoV-2 infection, do not rule out co-infections with other pathogens, and should not be used as the sole basis for treatment or other patient management decisions. Negative results must be combined with clinical observations, patient history, and epidemiological information. The expected result is Negative. Fact Sheet for Patients: SugarRoll.be Fact Sheet for Healthcare Providers: https://www.woods-mathews.com/ This test is not yet approved or cleared by the [place] FDA and  has been authorized for detection and/or diagnosis of  SARS-CoV-2 by FDA under an Emergency Use Authorization (EUA). This EUA will remain  in effect (meaning this test can be used) for the duration of the COVID-19 declaration under Section 56 4(b)(1) of the Act, 21 U.S.C. section 360bbb-3(b)(1), unless the authorization is terminated or revoked sooner. Performed at [facility], [place] [address], [place], [place] [address]      Time coordinating discharge: 35 minutes  SIGNED:   Louellen Molder, MD  [facility] 02/16/2019, 1:10 PM Pager   If 7PM-7AM, please contact night-coverage [email or web address] Password [email or web address]

## 2019-02-19 ENCOUNTER — Other Ambulatory Visit: Payer: Self-pay | Admitting: Urology

## 2019-02-23 ENCOUNTER — Other Ambulatory Visit: Payer: Self-pay

## 2019-02-23 ENCOUNTER — Encounter: Payer: Self-pay | Admitting: Family Medicine

## 2019-02-23 ENCOUNTER — Emergency Department: Payer: Medicare Other

## 2019-02-23 ENCOUNTER — Inpatient Hospital Stay
Admission: EM | Admit: 2019-02-23 | Discharge: 2019-02-27 | DRG: 308 | Disposition: A | Discharge status: Home / Self Care | Payer: Medicare Other | Attending: Internal Medicine | Admitting: Internal Medicine

## 2019-02-23 DIAGNOSIS — J9611 Chronic respiratory failure with hypoxia: Secondary | ICD-10-CM | POA: Diagnosis present

## 2019-02-23 DIAGNOSIS — R001 Bradycardia, unspecified: Secondary | ICD-10-CM | POA: Diagnosis not present

## 2019-02-23 DIAGNOSIS — N179 Acute kidney failure, unspecified: Secondary | ICD-10-CM | POA: Diagnosis present

## 2019-02-23 DIAGNOSIS — Z9641 Presence of insulin pump (external) (internal): Secondary | ICD-10-CM | POA: Diagnosis present

## 2019-02-23 DIAGNOSIS — T447X5A Adverse effect of beta-adrenoreceptor antagonists, initial encounter: Secondary | ICD-10-CM | POA: Diagnosis present

## 2019-02-23 DIAGNOSIS — D72829 Elevated white blood cell count, unspecified: Secondary | ICD-10-CM | POA: Diagnosis present

## 2019-02-23 DIAGNOSIS — N1831 Chronic kidney disease, stage 3a: Secondary | ICD-10-CM | POA: Diagnosis present

## 2019-02-23 DIAGNOSIS — Z9049 Acquired absence of other specified parts of digestive tract: Secondary | ICD-10-CM

## 2019-02-23 DIAGNOSIS — Z87891 Personal history of nicotine dependence: Secondary | ICD-10-CM

## 2019-02-23 DIAGNOSIS — Z888 Allergy status to other drugs, medicaments and biological substances status: Secondary | ICD-10-CM

## 2019-02-23 DIAGNOSIS — Z79899 Other long term (current) drug therapy: Secondary | ICD-10-CM

## 2019-02-23 DIAGNOSIS — E1122 Type 2 diabetes mellitus with diabetic chronic kidney disease: Secondary | ICD-10-CM | POA: Diagnosis present

## 2019-02-23 DIAGNOSIS — R0602 Shortness of breath: Secondary | ICD-10-CM | POA: Diagnosis not present

## 2019-02-23 DIAGNOSIS — Z6836 Body mass index (BMI) 36.0-36.9, adult: Secondary | ICD-10-CM

## 2019-02-23 DIAGNOSIS — J811 Chronic pulmonary edema: Secondary | ICD-10-CM

## 2019-02-23 DIAGNOSIS — I5033 Acute on chronic diastolic (congestive) heart failure: Secondary | ICD-10-CM | POA: Diagnosis present

## 2019-02-23 DIAGNOSIS — E669 Obesity, unspecified: Secondary | ICD-10-CM | POA: Diagnosis present

## 2019-02-23 DIAGNOSIS — J9621 Acute and chronic respiratory failure with hypoxia: Secondary | ICD-10-CM | POA: Diagnosis present

## 2019-02-23 DIAGNOSIS — Z7952 Long term (current) use of systemic steroids: Secondary | ICD-10-CM

## 2019-02-23 DIAGNOSIS — I130 Hypertensive heart and chronic kidney disease with heart failure and stage 1 through stage 4 chronic kidney disease, or unspecified chronic kidney disease: Secondary | ICD-10-CM | POA: Diagnosis present

## 2019-02-23 DIAGNOSIS — Z91013 Allergy to seafood: Secondary | ICD-10-CM

## 2019-02-23 DIAGNOSIS — Z20828 Contact with and (suspected) exposure to other viral communicable diseases: Secondary | ICD-10-CM | POA: Diagnosis present

## 2019-02-23 DIAGNOSIS — Z960 Presence of urogenital implants: Secondary | ICD-10-CM | POA: Diagnosis present

## 2019-02-23 DIAGNOSIS — E1142 Type 2 diabetes mellitus with diabetic polyneuropathy: Secondary | ICD-10-CM | POA: Diagnosis present

## 2019-02-23 DIAGNOSIS — J449 Chronic obstructive pulmonary disease, unspecified: Secondary | ICD-10-CM | POA: Diagnosis present

## 2019-02-23 DIAGNOSIS — Z9981 Dependence on supplemental oxygen: Secondary | ICD-10-CM

## 2019-02-23 DIAGNOSIS — E785 Hyperlipidemia, unspecified: Secondary | ICD-10-CM | POA: Diagnosis present

## 2019-02-23 DIAGNOSIS — R600 Localized edema: Secondary | ICD-10-CM | POA: Diagnosis present

## 2019-02-23 DIAGNOSIS — Z794 Long term (current) use of insulin: Secondary | ICD-10-CM

## 2019-02-23 DIAGNOSIS — E11649 Type 2 diabetes mellitus with hypoglycemia without coma: Secondary | ICD-10-CM | POA: Diagnosis present

## 2019-02-23 DIAGNOSIS — I10 Essential (primary) hypertension: Secondary | ICD-10-CM | POA: Diagnosis present

## 2019-02-23 DIAGNOSIS — R32 Unspecified urinary incontinence: Secondary | ICD-10-CM | POA: Diagnosis present

## 2019-02-23 DIAGNOSIS — D509 Iron deficiency anemia, unspecified: Secondary | ICD-10-CM | POA: Diagnosis present

## 2019-02-23 LAB — GLUCOSE, CAPILLARY
Glucose-Capillary: 118 mg/dL — ABNORMAL HIGH (ref 70–99)
Glucose-Capillary: 43 mg/dL — CL (ref 70–99)
Glucose-Capillary: 119 mg/dL — ABNORMAL HIGH (ref 70–99)
Glucose-Capillary: 105 mg/dL — ABNORMAL HIGH (ref 70–99)

## 2019-02-23 LAB — BASIC METABOLIC PANEL
Anion gap: 17 — ABNORMAL HIGH (ref 5–15)
BUN: 115 mg/dL — ABNORMAL HIGH (ref 8–23)
CO2: 18 mmol/L — ABNORMAL LOW (ref 22–32)
Calcium: 8.9 mg/dL (ref 8.9–10.3)
Chloride: 103 mmol/L (ref 98–111)
Creatinine, Ser: 2.57 mg/dL — ABNORMAL HIGH (ref 0.44–1.00)
GFR calc Af Amer: 20 mL/min — ABNORMAL LOW (ref >60)
GFR calc non Af Amer: 18 mL/min — ABNORMAL LOW (ref >60)
Glucose, Bld: 58 mg/dL — ABNORMAL LOW (ref 70–99)
Potassium: 4.6 mmol/L (ref 3.5–5.1)
Sodium: 138 mmol/L (ref 135–145)

## 2019-02-23 LAB — RESPIRATORY PANEL BY RT PCR (FLU A&B, COVID)
Influenza A by PCR: NEGATIVE
Influenza B by PCR: NEGATIVE
SARS Coronavirus 2 by RT PCR: NEGATIVE

## 2019-02-23 LAB — CBC
HCT: 33.8 % — ABNORMAL LOW (ref 36.0–46.0)
Hemoglobin: 10.6 g/dL — ABNORMAL LOW (ref 12.0–15.0)
MCH: 28.6 pg (ref 26.0–34.0)
MCHC: 31.4 g/dL (ref 30.0–36.0)
MCV: 91.1 fL (ref 80.0–100.0)
Platelets: 285 10*3/uL (ref 150–400)
RBC: 3.71 MIL/uL — ABNORMAL LOW (ref 3.87–5.11)
RDW: 15.9 % — ABNORMAL HIGH (ref 11.5–15.5)
WBC: 13.5 10*3/uL — ABNORMAL HIGH (ref 4.0–10.5)
nRBC: 0 % (ref 0.0–0.2)

## 2019-02-23 LAB — TROPONIN I (HIGH SENSITIVITY)
Troponin I (High Sensitivity): 11 ng/L (ref <18)
Troponin I (High Sensitivity): 12 ng/L (ref <18)

## 2019-02-23 LAB — BRAIN NATRIURETIC PEPTIDE: B Natriuretic Peptide: 1897 pg/mL — ABNORMAL HIGH (ref 0.0–100.0)

## 2019-02-23 LAB — TSH: TSH: 4.029 u[IU]/mL (ref 0.350–4.500)

## 2019-02-23 MED ORDER — ONDANSETRON HCL 4 MG PO TABS: 4.0000 mg | ORAL_TABLET | Freq: Four times a day (QID) | ORAL | Status: DC | PRN

## 2019-02-23 MED ORDER — ALBUTEROL SULFATE (2.5 MG/3ML) 0.083% IN NEBU
2.5000 mg | INHALATION_SOLUTION | RESPIRATORY_TRACT | Status: DC | PRN
  Administered 2019-02-24: 17:00:00 2.5 mg via RESPIRATORY_TRACT
  Filled 2019-02-23 (×2): qty 3

## 2019-02-23 MED ORDER — PANTOPRAZOLE SODIUM 40 MG PO TBEC
40.0000 mg | DELAYED_RELEASE_TABLET | Freq: Every day | ORAL | Status: DC
  Administered 2019-02-24 – 2019-02-27 (×4): 40 mg via ORAL
  Filled 2019-02-23 (×4): qty 1

## 2019-02-23 MED ORDER — DEXTROSE 50 % IV SOLN
INTRAVENOUS | Status: AC
  Administered 2019-02-23: 1 via INTRAVENOUS
  Filled 2019-02-23: qty 50

## 2019-02-23 MED ORDER — DARIFENACIN HYDROBROMIDE ER 7.5 MG PO TB24
7.5000 mg | ORAL_TABLET | Freq: Every day | ORAL | Status: DC
  Administered 2019-02-24 – 2019-02-27 (×4): 7.5 mg via ORAL
  Filled 2019-02-23 (×5): qty 1

## 2019-02-23 MED ORDER — ACETAMINOPHEN 650 MG RE SUPP: 650.0000 mg | Freq: Four times a day (QID) | RECTAL | Status: DC | PRN

## 2019-02-23 MED ORDER — FERROUS SULFATE 325 (65 FE) MG PO TABS
325.0000 mg | ORAL_TABLET | Freq: Every day | ORAL | Status: DC
  Administered 2019-02-23 – 2019-02-27 (×5): 325 mg via ORAL
  Filled 2019-02-23 (×5): qty 1

## 2019-02-23 MED ORDER — HEPARIN SODIUM (PORCINE) 5000 UNIT/ML IJ SOLN
5000.0000 [IU] | Freq: Three times a day (TID) | INTRAMUSCULAR | Status: DC
  Administered 2019-02-23 – 2019-02-24 (×2): 5000 [IU] via SUBCUTANEOUS
  Filled 2019-02-23 (×2): qty 1

## 2019-02-23 MED ORDER — FUROSEMIDE 10 MG/ML IJ SOLN
40.0000 mg | Freq: Two times a day (BID) | INTRAMUSCULAR | Status: DC
  Administered 2019-02-24 (×2): 40 mg via INTRAVENOUS
  Filled 2019-02-23 (×2): qty 4

## 2019-02-23 MED ORDER — FLUTICASONE-UMECLIDIN-VILANT 100-62.5-25 MCG/INH IN AEPB: 1.0000 | INHALATION_SPRAY | Freq: Every day | RESPIRATORY_TRACT | Status: DC

## 2019-02-23 MED ORDER — UMECLIDINIUM BROMIDE 62.5 MCG/INH IN AEPB
1.0000 | INHALATION_SPRAY | Freq: Every day | RESPIRATORY_TRACT | Status: DC
  Administered 2019-02-24 – 2019-02-27 (×4): 1 via RESPIRATORY_TRACT
  Filled 2019-02-23: qty 7

## 2019-02-23 MED ORDER — VITAMIN B-12 1000 MCG PO TABS
500.0000 ug | ORAL_TABLET | Freq: Every day | ORAL | Status: DC
  Administered 2019-02-24 – 2019-02-27 (×4): 500 ug via ORAL
  Filled 2019-02-23 (×4): qty 1

## 2019-02-23 MED ORDER — ACETAMINOPHEN 325 MG PO TABS
650.0000 mg | ORAL_TABLET | Freq: Four times a day (QID) | ORAL | Status: DC | PRN
  Administered 2019-02-24 – 2019-02-27 (×6): 650 mg via ORAL
  Filled 2019-02-23 (×6): qty 2

## 2019-02-23 MED ORDER — DEXTROSE 50 % IV SOLN: 1.0000 | Freq: Once | INTRAVENOUS | Status: AC

## 2019-02-23 MED ORDER — FLUTICASONE FUROATE-VILANTEROL 100-25 MCG/INH IN AEPB
1.0000 | INHALATION_SPRAY | Freq: Every day | RESPIRATORY_TRACT | Status: DC
  Administered 2019-02-24 – 2019-02-27 (×4): 1 via RESPIRATORY_TRACT
  Filled 2019-02-23: qty 28

## 2019-02-23 MED ORDER — FLUTICASONE PROPIONATE 50 MCG/ACT NA SUSP
2.0000 | Freq: Every day | NASAL | Status: DC
  Administered 2019-02-24 – 2019-02-27 (×4): 2 via NASAL
  Filled 2019-02-23 (×2): qty 16

## 2019-02-23 MED ORDER — ENOXAPARIN SODIUM 30 MG/0.3ML ~~LOC~~ SOLN: 30.0000 mg | SUBCUTANEOUS | Status: DC

## 2019-02-23 MED ORDER — MONTELUKAST SODIUM 10 MG PO TABS
10.0000 mg | ORAL_TABLET | Freq: Every day | ORAL | Status: DC
  Administered 2019-02-23 – 2019-02-26 (×4): 10 mg via ORAL
  Filled 2019-02-23 (×4): qty 1

## 2019-02-23 MED ORDER — INSULIN PUMP
Freq: Three times a day (TID) | SUBCUTANEOUS | Status: DC
  Filled 2019-02-23: qty 1

## 2019-02-23 MED ORDER — POTASSIUM CHLORIDE CRYS ER 20 MEQ PO TBCR: 20.0000 meq | EXTENDED_RELEASE_TABLET | Freq: Every day | ORAL | Status: DC

## 2019-02-23 MED ORDER — ATORVASTATIN CALCIUM 10 MG PO TABS
10.0000 mg | ORAL_TABLET | Freq: Every day | ORAL | Status: DC
  Administered 2019-02-23 – 2019-02-26 (×4): 10 mg via ORAL
  Filled 2019-02-23 (×4): qty 1

## 2019-02-23 MED ORDER — FUROSEMIDE 10 MG/ML IJ SOLN
40.0000 mg | Freq: Two times a day (BID) | INTRAMUSCULAR | Status: DC
  Administered 2019-02-23: 18:00:00 40 mg via INTRAVENOUS
  Filled 2019-02-23: qty 4

## 2019-02-23 MED ORDER — ONDANSETRON HCL 4 MG/2ML IJ SOLN: 4.0000 mg | Freq: Four times a day (QID) | INTRAMUSCULAR | Status: DC | PRN

## 2019-02-23 NOTE — ED Provider Notes (Signed)
[facility] Emergency Department Provider Note  ____________________________________________  Time seen: Approximately 4:01 PM  I have reviewed the triage vital signs and the nursing notes.   HISTORY  Chief Complaint Shortness of Breath    HPI Karen [name] is a 73 y.o. female with a history of heart failure, CKD, COPD, peripheral edema, hypertension who comes the ED complaining of shortness of breath has been worsening for the last 3 days, gradual onset.  Denies orthopnea or dyspnea on exertion.  No aggravating or alleviating factors.  Denies chest pain fever or body aches.  Shortness of breath is constant.  Denies syncope.  Eating and drinking normally.  Reviewed electronic medical record.  Patient was recently hospitalized due to bradycardia as well as heart failure and volume overload.  She was diuresed 9 L and felt better.  It was also determined that the bradycardia was due to beta-blocker use which the patient had inadvertently restarted despite it being discontinued in the past for similar problem.  On questioning, patient agrees that it is likely the beta-blocker was still in her pillbox and that she had again inadvertently resumed taking it when she left the hospital a week ago.      Past Medical History:  Diagnosis Date  . (HFpEF) heart failure with preserved ejection fraction ([place]) 2017   a. 2017 Echo: EF 50%; b. 06/2018 Echo: EF 50-55%; c. 08/2018 Echo: EF 50-55%, Nl RV fxn. RVSP 60.72mmHg. Mild BAE. Mild to mod TR.     Marland Kitchen Acute on chronic respiratory failure with hypoxia and hypercapnia ([place]) 01/07/2015  . Anemia   . 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 Asthma   . Cataract   . CKD (chronic kidney disease), stage III   . COPD (chronic obstructive pulmonary disease) ([place])    (1) 06/2018 tobacco use, home 3L oxygen   . Diabetes mellitus without complication (HCC)    (1) A1C 7.7 (06/2018)  . Edema, peripheral 04/20/2014  . History of kidney stones   .  Hyperlipidemia   . Hypertension   . Iron deficiency anemia 06/22/2014  . Junctional bradycardia    a. In setting of beta blocker therapy.  . Leucocytosis 10/19/2015  . Morbid obesity ([employer])   . Overactive bladder   . Primary osteoarthritis of right knee 09/01/2016  . Sciatica 01/07/2015     Patient Active Problem List   Diagnosis Date Noted  . Acute on chronic diastolic CHF (congestive heart failure) ([place]) 02/23/2019  . Atypical chest pain 02/13/2019  . AVM (arteriovenous malformation) of small bowel, acquired   . Acute gastric ulcer with hemorrhage   . Chronic diastolic heart failure ([place]) 08/22/2018  . Diarrhea 08/22/2018  . Junctional bradycardia   . Acute on chronic heart failure with preserved ejection fraction (HFpEF) ([place])   . AKI (acute kidney injury) ([place])   . Symptomatic bradycardia 08/05/2018  . Acute on chronic respiratory failure ([place]) 06/25/2018  . 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 diabetes mellitus ([place]) 01/25/2018  . History of non anemic vitamin B12 deficiency 01/25/2018  . Personal history of kidney stones 11/12/2017  . Urge incontinence 11/12/2017  . Right ureteral stone 09/15/2017  . Acute GI bleeding   . GI bleed 08/26/2017  . Arthritis 08/10/2017  . Chronic kidney disease 08/10/2017  . COPD (chronic obstructive pulmonary disease) ([place]) 08/10/2017  . Diabetes mellitus type 2, uncomplicated ([place]) 0000000  . Hypertension 08/10/2017  . Obesity (BMI 35.0-39.9 without comorbidity) 04/11/2017  . Primary osteoarthritis of right knee  09/01/2016  . Leucocytosis 10/19/2015  . 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 Acute on chronic respiratory failure with hypoxia and hypercapnia ([place]) 01/07/2015  . Sciatica 01/07/2015  . Weakness 01/07/2015  . 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 Iron deficiency anemia 06/22/2014  . Microalbuminuria 06/22/2014  . CHF (congestive heart failure) ([place]) 04/20/2014  . Edema, peripheral  04/20/2014     Past Surgical History:  Procedure Laterality Date  . APPENDECTOMY    . CESAREAN SECTION     x3  . CHOLECYSTECTOMY    . COLONOSCOPY WITH PROPOFOL N/A 08/28/2017   Procedure: COLONOSCOPY WITH PROPOFOL;  Surgeon: Lucilla Lame, MD;  Location[facility] END[facility]COPY;  Service: Endoscopy;  Laterality: N/A;  . COLONOSCOPY WITH PROPOFOL N/A 08/29/2017   Procedure: COLONOSCOPY WITH PROPOFOL;  Surgeon: Lucilla Lame, MD;  Location: [facility] ENDOSCOPY;  Service: Endoscopy;  Laterality: N/A;  . CYSTOSCOPY W/ URETERAL STENT PLACEMENT Right 09/15/2017   Procedure: CYSTOSCOPY WITH RETROGRADE PYELOGRAM/URETERAL STENT PLACEMENT;  Surgeon: Cleon Gustin, MD;  Location: [facility] ORS;  Service: Urology;  Laterality: Right;  . CYSTOSCOPY/URETEROSCOPY/HOLMIUM LASER/STENT PLACEMENT Right 10/09/2017   Procedure: CYSTOSCOPY/URETEROSCOPY/HOLMIUM LASER/STENT PLACEMENT;  Surgeon: Abbie Sons, MD;  Location: [facility] ORS;  Service: Urology;  Laterality: Right;  right Stent exchange  . ESOPHAGOGASTRODUODENOSCOPY (EGD) WITH PROPOFOL N/A 09/16/2018   Procedure: ESOPHAGOGASTRODUODENOSCOPY (EGD) WITH PROPOFOL;  Surgeon: Lin Landsman, MD;  Location: [place];  Service: Gastroenterology;  Laterality: N/A;  . EYE SURGERY       Prior to Admission medications   Medication Sig Start Date End Date Taking? Authorizing Provider  albuterol (VENTOLIN HFA) 108 (90 Base) MCG/ACT inhaler INHALE 2 PUFFS BY MOUTH EVERY 6 HOURS AS NEEDED FOR WHEEZING 01/02/19   [provider]  atorvastatin (LIPITOR) 10 MG tablet Take 1 tablet by mouth daily.    [provider]  canagliflozin (INVOKANA) 100 MG TABS tablet Take 1 tablet by mouth daily.    [provider]  esomeprazole (NEXIUM) 40 MG capsule Take 40 mg by mouth daily. 12/17/18   [provider]  Ferrous Sulfate (IRON) 325 (65 Fe) MG TABS Take 1 tablet by mouth daily. 09/14/17   [provider]  fluticasone (FLONASE) 50 MCG/ACT nasal  spray Place 2 sprays into both nostrils daily. 11/15/18   [provider]  Fluticasone-Umeclidin-Vilant 100-62.5-25 MCG/INH AEPB Inhale 1 puff into the lungs daily. 12/25/17   [provider]  furosemide (LASIX) 40 MG tablet Take 1 tablet (40 mg total) by mouth 2 (two) times daily. 08/29/18   [name], MD  Insulin Human (INSULIN PUMP) SOLN Inject 1 each into the skin 3 times daily with meals, bedtime and 2 AM. Patient taking differently: Inject 1 each into the skin 3 times daily with meals, bedtime and 2 AM. Humalog Insulin 76 units daily 09/18/17   Dustin Flock, MD  ipratropium-albuterol (DUONEB) 0.5-2.5 (3) MG/3ML SOLN Inhale 3 mLs into the lungs 4 (four) times daily. 02/05/19   [provider]  losartan-hydrochlorothiazide (HYZAAR) 100-25 MG tablet Take 1 tablet by mouth daily. 01/05/19   [provider]  metFORMIN (GLUCOPHAGE) 500 MG tablet Take 1,000 mg by mouth 2 (two) times daily with a meal.     [provider]  montelukast (SINGULAIR) 10 MG tablet Take 10 mg by mouth at bedtime.    [provider]  solifenacin (VESICARE) 10 MG tablet Take 1 tablet (10 mg total) by mouth daily. 07/23/18   Stoioff, Ronda Fairly, MD  vitamin  B-12 (CYANOCOBALAMIN) 500 MCG tablet Take 500 mcg by mouth daily.    [provider]     Allergies Ace inhibitors, Beta adrenergic blockers, Gabapentin, Lisinopril, Lyrica [pregabalin], and Shrimp [shellfish allergy]   Family History  Problem Relation Age of Onset  . Other Mother        unknown medical history  . Other Father        unknown medical history    Social History Social History   Tobacco Use  . Smoking status: Former Smoker    Packs/day: 1.00    Years: 20.00    Pack years: 20.00    Quit date: 12/04/1992    Years since quitting: 26.2  . Smokeless tobacco: Never Used  Substance Use Topics  . Alcohol use: No  . Drug use: No    Review of Systems  Constitutional:   No fever or  chills.  ENT:   No sore throat. No rhinorrhea. Cardiovascular:   No chest pain or syncope. Respiratory: Positive shortness of breath without cough. Gastrointestinal:   Negative for abdominal pain, vomiting and diarrhea.  Musculoskeletal: Chronic leg swelling, improved from previous All other systems reviewed and are negative except as documented above in ROS and HPI.  ____________________________________________   PHYSICAL EXAM:  VITAL SIGNS: ED Triage Vitals  Enc Vitals Group     BP 02/23/19 1513 (!) 129/105     Pulse Rate 02/23/19 1513 (!) 38     Resp 02/23/19 1501 (!) 26     Temp 02/23/19 1501 (!) 97.5 F (36.4 C)     Temp Source 02/23/19 1501 Oral     SpO2 02/23/19 1501 99 %     Weight 02/23/19 1522 204 lb 2.3 oz (92.6 kg)     Height 02/23/19 1522 5\' 3"  (1.6 m)     Head Circumference --      Peak Flow --      Pain Score 02/23/19 1501 0     Pain Loc --      Pain Edu? --      Excl. in [place]? --     Vital signs reviewed, nursing assessments reviewed.   Constitutional:   Alert and oriented.  Ill-appearing Eyes:   Conjunctivae are normal. EOMI. PERRL. ENT      Head:   Normocephalic and atraumatic.      Nose:   Wearing a mask.      Mouth/Throat:   Wearing a mask.      Neck:   No meningismus. Full ROM. Hematological/Lymphatic/Immunilogical:   No cervical lymphadenopathy. Cardiovascular:   Bradycardia, heart rate 30-40. Symmetric bilateral radial and DP pulses.  No murmurs. Cap refill less than 2 seconds. Respiratory:   Normal respiratory effort without tachypnea/retractions.  Bilateral lower lung crackles Gastrointestinal:   Soft and nontender. Non distended. There is no CVA tenderness.  No rebound, rigidity, or guarding.  Musculoskeletal:   Normal range of motion in all extremities. No joint effusions.  No lower extremity tenderness.  1+ edema bilaterally, symmetric calf circumference Neurologic:   Normal speech and language.  Motor grossly intact. No acute focal  neurologic deficits are appreciated.  Skin:    Skin is warm, dry and intact. No rash noted.  No petechiae, purpura, or bullae.  ____________________________________________    LABS (pertinent positives/negatives) (all labs ordered are listed, but only abnormal results are displayed) Labs Reviewed  CBC - Abnormal; Notable for the following components:      Result Value   WBC 13.5 (*)  RBC 3.71 (*)    Hemoglobin 10.6 (*)    HCT 33.8 (*)    RDW 15.9 (*)    All other components within normal limits  BASIC METABOLIC PANEL - Abnormal; Notable for the following components:   CO2 18 (*)    Glucose, Bld 58 (*)    BUN 115 (*)    Creatinine, Ser 2.57 (*)    GFR calc non Af Amer 18 (*)    GFR calc Af Amer 20 (*)    Anion gap 17 (*)    All other components within normal limits  RESPIRATORY PANEL BY RT PCR (FLU A&B, COVID)  TROPONIN I (HIGH SENSITIVITY)   ____________________________________________   EKG  Interpreted by me Junctional bradycardia, rate of 39, normal axis and intervals.  Normal QRS and ST segments.  Unremarkable T waves.  No ischemic changes.  ____________________________________________    [id number]  DG Chest Port 1 View  Result Date: 02/23/2019 CLINICAL DATA:  Shortness of breath. EXAM: PORTABLE CHEST 1 VIEW COMPARISON:  February 13, 2019 FINDINGS: Stable cardiomegaly. The hila and mediastinum are normal. Transcutaneous pacer leads overlie the lower left chest obscuring this region. Mild edema suspected. No focal infiltrate. IMPRESSION: Cardiomegaly and mild pulmonary venous congestion/mild edema. No other acute abnormalities identified. Electronically Signed   By: Dorise Bullion III M.D   On: 02/23/2019 15:46    ____________________________________________   PROCEDURES .Critical Care Performed by: Carrie Mew, MD Authorized by: Carrie Mew, MD   Critical care provider statement:    Critical care time (minutes):  35   Critical care time was  exclusive of:  Separately billable procedures and treating other patients   Critical care was necessary to treat or prevent imminent or life-threatening deterioration of the following conditions:  Cardiac failure and circulatory failure   Critical care was time spent personally by me on the following activities:  Development of treatment plan with patient or surrogate, discussions with consultants, evaluation of patient's response to treatment, examination of patient, obtaining history from patient or surrogate, ordering and performing treatments and interventions, ordering and review of laboratory studies, ordering and review of radiographic studies, pulse oximetry, re-evaluation of patient's condition and review of old charts    ____________________________________________  DIFFERENTIAL DIAGNOSIS   Beta-blocker toxicity, heart failure, electrolyte abnormality, non-STEMI, COVID-19  CLINICAL IMPRESSION / ASSESSMENT AND PLAN / ED COURSE  Medications ordered in the ED: Medications - No data to display  Pertinent labs & imaging results that were available during my care of the patient were reviewed by me and considered in my medical decision making (see chart for details).  Karen [name] was evaluated in Emergency Department on 02/23/2019 for the symptoms described in the history of present illness. She was evaluated in the context of the global COVID-19 pandemic, which necessitated consideration that the patient might be at risk for infection with the SARS-CoV-2 virus that causes COVID-19. Institutional protocols and algorithms that pertain to the evaluation of patients at risk for COVID-19 are in a state of rapid change based on information released by regulatory bodies including the CDC and federal and state organizations. These policies and algorithms were followed during the patient's care in the ED.   Patient presents with shortness of breath and bradycardia.  Doubt unstable angina,  dissection, PE, sepsis.  Most likely this is due to inadvertent reinitiation of beta-blocker which is producing it over blockade/toxicity.  Patient is having evidence of heart failure as well as a result complicated by AKI.  since the beta-blocker toxicity can be expected to improve on its own, and pulm congestion findings may be due to both bradycardia and CHF/overload, will defer further intervention since the patient is not hypoxic or hypotensive.  Giving fluids or diuretics in this moment may worsen her clinical status.  Giving atropine may provoke dysrhythmia.  Will monitor with pacemaker connected at the bedside and plan to hospitalize for further monitoring and evaluation.  Will repeat Covid screening.      ____________________________________________   FINAL CLINICAL IMPRESSION(S) / ED DIAGNOSES    Final diagnoses:  Symptomatic bradycardia  AKI (acute kidney injury) [facility])     ED Discharge Orders    None      Portions of this note were generated with dragon dictation software. Dictation errors may occur despite best attempts at proofreading.   Carrie Mew, MD 02/23/19 [phone]

## 2019-02-23 NOTE — Progress Notes (Signed)
Randol Kern, NP is on unit and asked to evaluate patient for bradycardia. EKG ordered and CBG done prior (119). Patient's heart remains in the the 30's (35-39). Blood pressure stable. Pads @ bedside. This Probation officer asked NP for HR perimeter. Was told to call  For HR <30. Alarm is set for HR less than 35 ( to give room for emergency). CCMD notified.

## 2019-02-23 NOTE — Progress Notes (Signed)
I was called to bedside after patietn arrived to floor secondary to bradycardia on telemetry.  Patient with heart rate 36 junctional bradycardia as doemented in ED notes therefore, no change. She had stable blood pressure, alert and oriented. CBG stable. Will continue to monitor

## 2019-02-23 NOTE — H&P (Addendum)
History and Physical  Patient Name: Karen [name]     [id number]    DOB: 14-Jul-1945    DOA: 02/23/2019 PCP: Ricardo Jericho, NP  Patient coming from: Home  Chief Complaint: Dyspnea, slow heart rate      HPI: Karen [name] is a 73 y.o. F with hx COPD on 3L, dCHF, MO, DM on insulin pump, CKD IIIa baseline Cr 1.0 and recent junctional bradycardia due to beta-blockade who presents with bradycardia and dyspnea.  Patient recently admitted with bradycardia, congestive heart failure, AKI.  It was determined that this was due to inadvertently restarting her bisoprolol (she had junctional bradycardia in the past).  Cardiology were consulted, beta-blockade was held, she was diuresed 9 L, and her symptoms and kidney failure resolved.  She has been home about 10 days, and in the last 3 days, she started to have dyspnea with exertion again.  Then last night, she had orthopnea, could not sleep at all when lying flat due to dyspnea, had cough, and noticed that her heart rate was in the 30s again, felt extremely out of breath, so she returned to the ER.  In the ER, she had heart rate 38, BP 129/105.  Creatinine 2.5 (up from baseline 0.9 at discharge 10 days ago).  She had elevated anion gap, normal potassium.  Chest x-ray showed mild congestion and cardiomegaly.  ECG showed junctional rhythm, rate in the 30s.  Troponin was negative.    The hospitalist service were asked to evaluate for junctional bradycardia with symptoms.         ROS: Review of Systems  Constitutional: Negative for chills, fever and malaise/fatigue.  HENT: Negative for congestion and sore throat.   Respiratory: Positive for cough and shortness of breath. Negative for sputum production.   Cardiovascular: Positive for orthopnea and PND. Negative for chest pain, palpitations and leg swelling.  Neurological: Positive for dizziness. Negative for loss of consciousness.  All other systems reviewed and are negative.          Past Medical History:  Diagnosis Date  . (HFpEF) heart failure with preserved ejection fraction ([place]) 2017   a. 2017 Echo: EF 50%; b. 06/2018 Echo: EF 50-55%; c. 08/2018 Echo: EF 50-55%, Nl RV fxn. RVSP 60.68mmHg. Mild BAE. Mild to mod TR.     Marland Kitchen Acute on chronic respiratory failure with hypoxia and hypercapnia ([place]) 01/07/2015  . Anemia   . 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 Asthma   . Cataract   . CKD (chronic kidney disease), stage III   . COPD (chronic obstructive pulmonary disease) ([place])    (1) 06/2018 tobacco use, home 3L oxygen   . Diabetes mellitus without complication (HCC)    (1) A1C 7.7 (06/2018)  . Edema, peripheral 04/20/2014  . History of kidney stones   . Hyperlipidemia   . Hypertension   . Iron deficiency anemia 06/22/2014  . Junctional bradycardia    a. In setting of beta blocker therapy.  . Leucocytosis 10/19/2015  . Morbid obesity ([place])   . Overactive bladder   . Primary osteoarthritis of right knee 09/01/2016  . Sciatica 01/07/2015    Past Surgical History:  Procedure Laterality Date  . APPENDECTOMY    . CESAREAN SECTION     x3  . CHOLECYSTECTOMY    . COLONOSCOPY WITH PROPOFOL N/A 08/28/2017   Procedure: COLONOSCOPY WITH PROPOFOL;  Surgeon: Lucilla Lame, MD;  Location: [facility] ENDOSCOPY;  Service: Endoscopy;  Laterality: N/A;  . COLONOSCOPY WITH PROPOFOL N/A 08/29/2017  Procedure: COLONOSCOPY WITH PROPOFOL;  Surgeon: Lucilla Lame, MD;  Location[facility];  Service: Endoscopy;  Laterality: N/A;  . CYSTOSCOPY W/ URETERAL STENT PLACEMENT Right 09/15/2017   Procedure: CYSTOSCOPY WITH RETROGRADE PYELOGRAM/URETERAL STENT PLACEMENT;  Surgeon: Cleon Gustin, MD;  Location: [facility] ORS;  Service: Urology;  Laterality: Right;  . CYSTOSCOPY/URETEROSCOPY/HOLMIUM LASER/STENT PLACEMENT Right 10/09/2017   Procedure: CYSTOSCOPY/URETEROSCOPY/HOLMIUM LASER/STENT PLACEMENT;  Surgeon: Abbie Sons, MD;  Location: [facility] ORS;  Service: Urology;  Laterality: Right;  right Stent exchange   . ESOPHAGOGASTRODUODENOSCOPY (EGD) WITH PROPOFOL N/A 09/16/2018   Procedure: ESOPHAGOGASTRODUODENOSCOPY (EGD) WITH PROPOFOL;  Surgeon: Lin Landsman, MD;  Location: [place];  Service: Gastroenterology;  Laterality: N/A;  . EYE SURGERY      Social History: Patient lives with her husband.  The patient walks with a walker.  Remote former smoker.  Allergies  Allergen Reactions  . Ace Inhibitors Hives  . Beta Adrenergic Blockers     Junctional bradycardia  . Gabapentin Hives  . Lisinopril Hives  . Lyrica [Pregabalin] Hives  . Shrimp [Shellfish Allergy] Swelling    Swelling of the lips    Family history: Patient does not know her family history.  Prior to Admission medications   Medication Sig Start Date End Date Taking? Authorizing Provider  albuterol (VENTOLIN HFA) 108 (90 Base) MCG/ACT inhaler INHALE 2 PUFFS BY MOUTH EVERY 6 HOURS AS NEEDED FOR WHEEZING 01/02/19  Yes [provider]  atorvastatin (LIPITOR) 10 MG tablet Take 1 tablet by mouth daily.   Yes [provider]  canagliflozin (INVOKANA) 100 MG TABS tablet Take 1 tablet by mouth daily.   Yes [provider]  esomeprazole (NEXIUM) 40 MG capsule Take 40 mg by mouth daily. 12/17/18  Yes [provider]  Ferrous Sulfate (IRON) 325 (65 Fe) MG TABS Take 1 tablet by mouth daily. 09/14/17  Yes [provider]  fluticasone (FLONASE) 50 MCG/ACT nasal spray Place 2 sprays into both nostrils daily. 11/15/18  Yes [provider]  Fluticasone-Umeclidin-Vilant 100-62.5-25 MCG/INH AEPB Inhale 1 puff into the lungs daily. 12/25/17  Yes [provider]  furosemide (LASIX) 40 MG tablet Take 1 tablet (40 mg total) by mouth 2 (two) times daily. 08/29/18  Yes Gollan, Kathlene November, MD  Insulin Human (INSULIN PUMP) SOLN Inject 1 each into the skin 3 times daily with meals, bedtime and 2 AM. Patient taking differently: Inject 1 each into the skin 3 times daily with meals, bedtime and  2 AM. Humalog Insulin 76 units daily 09/18/17  Yes Dustin Flock, MD  ipratropium-albuterol (DUONEB) 0.5-2.5 (3) MG/3ML SOLN Inhale 3 mLs into the lungs 4 (four) times daily. 02/05/19  Yes [provider]  losartan-hydrochlorothiazide (HYZAAR) 100-25 MG tablet Take 1 tablet by mouth daily. 01/05/19  Yes [provider]  metFORMIN (GLUCOPHAGE) 500 MG tablet Take 1,000 mg by mouth 2 (two) times daily with a meal.    Yes [provider]  montelukast (SINGULAIR) 10 MG tablet Take 10 mg by mouth at bedtime.   Yes [provider]  solifenacin (VESICARE) 10 MG tablet Take 1 tablet (10 mg total) by mouth daily. 07/23/18  Yes Stoioff, Ronda Fairly, MD  vitamin B-12 (CYANOCOBALAMIN) 500 MCG tablet Take 500 mcg by mouth daily.   Yes [provider]       Physical Exam: BP (!) 118/47   Pulse (!) 37   Temp (!) 97.5 F (36.4 C) (Oral)   Resp 16   Ht 5\' 3"  (1.6 m)  Wt 92.6 kg   SpO2 100%   BMI 36.16 kg/m  General appearance: BMI 31 adult female, alert and no acute distress, interactive, appears slightly uncomfortable.   Eyes: Anicteric, conjunctiva pink, lids and lashes normal. PERRL.    ENT: No nasal deformity, discharge, epistaxis.  Hearing normal.  Dentures in place, partially edentulous on the lower teeth.  OP very dry without lesions.   Neck: No neck masses.  Trachea midline.  No thyromegaly/tenderness. Lymph: No cervical or supraclavicular lymphadenopathy. Skin: Warm and dry.  No jaundice.  No suspicious rashes or lesions. Cardiac: Bradycardic, regular, nl S1-S2, no murmurs appreciated.  Capillary refill is brisk.  JVP not visible due to body habitus.  1+ dependent LE edema.  Radial pulses 2+ and symmetric. Respiratory: Increased respiratory rate.  Lung sounds diminished bilaterally.  Crackles at bases.  No wheezing.   Abdomen: Abdomen soft.  No TTP or guarding. No ascites, distension, hepatosplenomegaly.   MSK: No deformities or effusions of the large  joints of the upper or lower extremities bilaterally.  No cyanosis or clubbing. Neuro: Cranial nerves 3 through 12 intact.  Sensation intact to light touch. Speech is fluent.  Muscle strength 5/5 in bilateral upper and lower extremities.    Psych: Sensorium intact and responding to questions, attention normal.  Behavior appropriate.  Affect blunted.  Judgment and insight appear normal.     Labs on Admission:  I have personally reviewed following labs and imaging studies: CBC: Recent Labs  Lab 02/23/19 1512  WBC 13.5*  HGB 10.6*  HCT 33.8*  MCV 91.1  PLT [id number]   Basic Metabolic Panel: Recent Labs  Lab 02/23/19 1512  NA 138  K 4.6  CL 103  CO2 18*  GLUCOSE 58*  BUN 115*  CREATININE 2.57*  CALCIUM 8.9   GFR: Estimated Creatinine Clearance: 20.5 mL/min (A) (by C-G formula based on SCr of 2.57 mg/dL (H)).  Cardiac Enzymes: Troponin normal   Recent Results (from the past 240 hour(s))  SARS CORONAVIRUS 2 (TAT 6-24 HRS) Nasopharyngeal Nasopharyngeal Swab     Status: None   Collection Time: 02/13/19  7:25 PM   Specimen: Nasopharyngeal Swab  Result Value Ref Range Status   SARS Coronavirus 2 NEGATIVE NEGATIVE Final    Comment: (NOTE) SARS-CoV-2 target nucleic acids are NOT DETECTED. The SARS-CoV-2 RNA is generally detectable in upper and lower respiratory specimens during the acute phase of infection. Negative results do not preclude SARS-CoV-2 infection, do not rule out co-infections with other pathogens, and should not be used as the sole basis for treatment or other patient management decisions. Negative results must be combined with clinical observations, patient history, and epidemiological information. The expected result is Negative. Fact Sheet for Patients: SugarRoll.be Fact Sheet for Healthcare Providers: https://www.woods-mathews.com/ This test is not yet approved or cleared by the [place] FDA and  has been  authorized for detection and/or diagnosis of SARS-CoV-2 by FDA under an Emergency Use Authorization (EUA). This EUA will remain  in effect (meaning this test can be used) for the duration of the COVID-19 declaration under Section 56 4(b)(1) of the Act, 21 U.S.C. section 360bbb-3(b)(1), unless the authorization is terminated or revoked sooner. Performed at [facility], [place] [address] [place], [place] [address]   Respiratory Panel by RT PCR (Flu A&B, Covid) - Nasopharyngeal Swab     Status: None   Collection Time: 02/23/19  4:05 PM   Specimen: Nasopharyngeal Swab  Result Value Ref Range Status   SARS  Coronavirus 2 by RT PCR NEGATIVE NEGATIVE Final    Comment: (NOTE) SARS-CoV-2 target nucleic acids are NOT DETECTED. The SARS-CoV-2 RNA is generally detectable in upper respiratoy specimens during the acute phase of infection. The lowest concentration of SARS-CoV-2 viral copies this assay can detect is 131 copies/mL. A negative result does not preclude SARS-Cov-2 infection and should not be used as the sole basis for treatment or other patient management decisions. A negative result may occur with  improper specimen collection/handling, submission of specimen other than nasopharyngeal swab, presence of viral mutation(s) within the areas targeted by this assay, and inadequate number of viral copies (<131 copies/mL). A negative result must be combined with clinical observations, patient history, and epidemiological information. The expected result is Negative. Fact Sheet for Patients:  PinkCheek.be Fact Sheet for Healthcare Providers:  GravelBags.it This test is not yet ap proved or cleared by the [place] FDA and  has been authorized for detection and/or diagnosis of SARS-CoV-2 by FDA under an Emergency Use Authorization (EUA). This EUA will remain  in effect (meaning this test can be used) for the duration of  the COVID-19 declaration under Section 564(b)(1) of the Act, 21 U.S.C. section 360bbb-3(b)(1), unless the authorization is terminated or revoked sooner.    Influenza A by PCR NEGATIVE NEGATIVE Final   Influenza B by PCR NEGATIVE NEGATIVE Final    Comment: (NOTE) The Xpert Xpress SARS-CoV-2/FLU/RSV assay is intended as an aid in  the diagnosis of influenza from Nasopharyngeal swab specimens and  should not be used as a sole basis for treatment. Nasal washings and  aspirates are unacceptable for Xpert Xpress SARS-CoV-2/FLU/RSV  testing. Fact Sheet for Patients: PinkCheek.be Fact Sheet for Healthcare Providers: GravelBags.it This test is not yet approved or cleared by the [place] FDA and  has been authorized for detection and/or diagnosis of SARS-CoV-2 by  FDA under an Emergency Use Authorization (EUA). This EUA will remain  in effect (meaning this test can be used) for the duration of the  Covid-19 declaration under Section 564(b)(1) of the Act, 21  U.S.C. section 360bbb-3(b)(1), unless the authorization is  terminated or revoked. Performed at [facility], [address] [place], [place] [address]            Radiological Exams on Admission: Personally reviewed CXR shows cardiomegaly; possibly mild edema: DG Chest Port 1 View  Result Date: 02/23/2019 CLINICAL DATA:  Shortness of breath. EXAM: PORTABLE CHEST 1 VIEW COMPARISON:  February 13, 2019 FINDINGS: Stable cardiomegaly. The hila and mediastinum are normal. Transcutaneous pacer leads overlie the lower left chest obscuring this region. Mild edema suspected. No focal infiltrate. IMPRESSION: Cardiomegaly and mild pulmonary venous congestion/mild edema. No other acute abnormalities identified. Electronically Signed   By: Dorise Bullion III M.D   On: 02/23/2019 15:46    EKG: Independently reviewed. Rate 38, junctional rhythm       Assessment/Plan    Junctional bradycardia Based on the history, we believe this was again from inadvertent bisoprolol use.  I have asked her son and husband to bring in her pillbox and pill bottles tomorrow so that we can try to confirm if this is the case.  At present she is hemodynamically stable and mentating well. -Monitor on telemetry -Hold all beta blockade -Check TSH   Acute on chronic diastolic CHF Hypertension EF 65 to 70% on echocardiogram earlier this month.   Her bradycardia led to low cardiac output heart failure. Given her BP is adequate here and she  is clearly fluid overloaded on exam, it is reasonable and appropriate to begin diuresis -Furosemide 40 mg IV twice a day  -K supplement -Strict I/Os, daily weights, telemetry  -Daily monitoring renal function -Hold losartan, HCTZ until hemodynamics clearer   Acute kidney injury on CKD IIIa Likely this is congestive nephropathy. -Hold losartan, HCTZ -Diuresis with Lasix -Close monitoring creatinine and potassium  COPD with chronic hypoxic respiratory failure No wheezing or evidence of bronchospasm on exam. -Continue home ICS LABA LAMA -Continue Singulair, PPI, Flonase  Hypoglycemia Hypoglycemic on arrival here due to diet neglected by ER.  -Treat asymptomatic hypoglycemia with D50 here and repeat CBG in 15 minutes  Insulin-dependent diabetes -Continue home insulin pump per protocol after resolution of hypoglycemia -Hold home Metformin and canagliflozin -Continue atorvastatin  Anemia Normocytic, listed in the chart is iron deficiency. -Continue iron, B12  Bladder incontinence -Continue Vesicare  Leukocytosis This is chronic, unclear cause.  Doubt infection.         DVT prophylaxis: Lovenox Code Status: Full code, confirmed with son Family Communication: Son by phone Disposition Plan: Anticipate telemetry monitoring, diuresis, cardiology consultation Consults called: We will need formal cardiology consult in a.m.  with CHG Admission status: Observation   At the point of initial evaluation, it is my clinical opinion that admission for OBSERVATION is reasonable and necessary because the patient's presenting complaints in the context of their chronic conditions represent sufficient risk of deterioration or significant morbidity to constitute reasonable grounds for close observation in the hospital setting, but that the patient may be medically stable for discharge from the hospital within 24 to 48 hours.    Medical decision making: Patient seen at 5:31 PM on 02/23/2019.  The patient was discussed with Dr. Joni Fears.  What exists of the patient's chart was reviewed in depth and summarized above.  Clinical condition: Stable, mentation good.        [facility] Please page though [place] or Epic secure chat:  For password, contact charge nurse

## 2019-02-23 NOTE — ED Notes (Signed)
Checked a CBG

## 2019-02-23 NOTE — ED Notes (Addendum)
Pt CBG 43. Dr. Loleta Books noted. Verbal orders to give D50 and feed pt and give juice. Pt given food and juice

## 2019-02-23 NOTE — ED Triage Notes (Signed)
Pt presents via POV c/o SOB. Pt wears home 02 at 2L. Reports SOB began a couple of days ago. Denies fever.

## 2019-02-24 ENCOUNTER — Ambulatory Visit: Payer: Medicare Other | Admitting: Family

## 2019-02-24 DIAGNOSIS — R001 Bradycardia, unspecified: Secondary | ICD-10-CM | POA: Diagnosis present

## 2019-02-24 DIAGNOSIS — J432 Centrilobular emphysema: Secondary | ICD-10-CM | POA: Diagnosis not present

## 2019-02-24 DIAGNOSIS — E11649 Type 2 diabetes mellitus with hypoglycemia without coma: Secondary | ICD-10-CM | POA: Diagnosis present

## 2019-02-24 DIAGNOSIS — R32 Unspecified urinary incontinence: Secondary | ICD-10-CM | POA: Diagnosis present

## 2019-02-24 DIAGNOSIS — D72829 Elevated white blood cell count, unspecified: Secondary | ICD-10-CM | POA: Diagnosis present

## 2019-02-24 DIAGNOSIS — E669 Obesity, unspecified: Secondary | ICD-10-CM | POA: Diagnosis present

## 2019-02-24 DIAGNOSIS — D509 Iron deficiency anemia, unspecified: Secondary | ICD-10-CM | POA: Diagnosis present

## 2019-02-24 DIAGNOSIS — J449 Chronic obstructive pulmonary disease, unspecified: Secondary | ICD-10-CM | POA: Diagnosis present

## 2019-02-24 DIAGNOSIS — Z9981 Dependence on supplemental oxygen: Secondary | ICD-10-CM | POA: Diagnosis not present

## 2019-02-24 DIAGNOSIS — R600 Localized edema: Secondary | ICD-10-CM | POA: Diagnosis present

## 2019-02-24 DIAGNOSIS — N183 Chronic kidney disease, stage 3 unspecified: Secondary | ICD-10-CM

## 2019-02-24 DIAGNOSIS — R0602 Shortness of breath: Secondary | ICD-10-CM | POA: Diagnosis present

## 2019-02-24 DIAGNOSIS — Z7952 Long term (current) use of systemic steroids: Secondary | ICD-10-CM | POA: Diagnosis not present

## 2019-02-24 DIAGNOSIS — N1831 Chronic kidney disease, stage 3a: Secondary | ICD-10-CM | POA: Diagnosis present

## 2019-02-24 DIAGNOSIS — Z20828 Contact with and (suspected) exposure to other viral communicable diseases: Secondary | ICD-10-CM | POA: Diagnosis present

## 2019-02-24 DIAGNOSIS — Z960 Presence of urogenital implants: Secondary | ICD-10-CM | POA: Diagnosis present

## 2019-02-24 DIAGNOSIS — I5033 Acute on chronic diastolic (congestive) heart failure: Secondary | ICD-10-CM

## 2019-02-24 DIAGNOSIS — Z888 Allergy status to other drugs, medicaments and biological substances status: Secondary | ICD-10-CM | POA: Diagnosis not present

## 2019-02-24 DIAGNOSIS — Z79899 Other long term (current) drug therapy: Secondary | ICD-10-CM | POA: Diagnosis not present

## 2019-02-24 DIAGNOSIS — J9611 Chronic respiratory failure with hypoxia: Secondary | ICD-10-CM | POA: Diagnosis present

## 2019-02-24 DIAGNOSIS — E785 Hyperlipidemia, unspecified: Secondary | ICD-10-CM | POA: Diagnosis present

## 2019-02-24 DIAGNOSIS — E1122 Type 2 diabetes mellitus with diabetic chronic kidney disease: Secondary | ICD-10-CM | POA: Diagnosis present

## 2019-02-24 DIAGNOSIS — Z9049 Acquired absence of other specified parts of digestive tract: Secondary | ICD-10-CM | POA: Diagnosis not present

## 2019-02-24 DIAGNOSIS — I130 Hypertensive heart and chronic kidney disease with heart failure and stage 1 through stage 4 chronic kidney disease, or unspecified chronic kidney disease: Secondary | ICD-10-CM | POA: Diagnosis present

## 2019-02-24 DIAGNOSIS — Z794 Long term (current) use of insulin: Secondary | ICD-10-CM | POA: Diagnosis not present

## 2019-02-24 DIAGNOSIS — N179 Acute kidney failure, unspecified: Secondary | ICD-10-CM | POA: Diagnosis present

## 2019-02-24 DIAGNOSIS — E1142 Type 2 diabetes mellitus with diabetic polyneuropathy: Secondary | ICD-10-CM | POA: Diagnosis present

## 2019-02-24 LAB — GLUCOSE, CAPILLARY
Glucose-Capillary: 58 mg/dL — ABNORMAL LOW (ref 70–99)
Glucose-Capillary: 84 mg/dL (ref 70–99)
Glucose-Capillary: 128 mg/dL — ABNORMAL HIGH (ref 70–99)
Glucose-Capillary: 205 mg/dL — ABNORMAL HIGH (ref 70–99)
Glucose-Capillary: 269 mg/dL — ABNORMAL HIGH (ref 70–99)
Glucose-Capillary: 231 mg/dL — ABNORMAL HIGH (ref 70–99)
Glucose-Capillary: 292 mg/dL — ABNORMAL HIGH (ref 70–99)

## 2019-02-24 LAB — HEMOGLOBIN A1C
Hgb A1c MFr Bld: 8.4 % — ABNORMAL HIGH (ref 4.8–5.6)
Mean Plasma Glucose: 194.38 mg/dL

## 2019-02-24 LAB — CBC
HCT: 29.1 % — ABNORMAL LOW (ref 36.0–46.0)
Hemoglobin: 9.8 g/dL — ABNORMAL LOW (ref 12.0–15.0)
MCH: 29.1 pg (ref 26.0–34.0)
MCHC: 33.7 g/dL (ref 30.0–36.0)
MCV: 86.4 fL (ref 80.0–100.0)
Platelets: 253 10*3/uL (ref 150–400)
RBC: 3.37 MIL/uL — ABNORMAL LOW (ref 3.87–5.11)
RDW: 15.7 % — ABNORMAL HIGH (ref 11.5–15.5)
WBC: 10.9 10*3/uL — ABNORMAL HIGH (ref 4.0–10.5)
nRBC: 0 % (ref 0.0–0.2)

## 2019-02-24 LAB — BASIC METABOLIC PANEL
Anion gap: 11 (ref 5–15)
BUN: 104 mg/dL — ABNORMAL HIGH (ref 8–23)
CO2: 27 mmol/L (ref 22–32)
Calcium: 8.7 mg/dL — ABNORMAL LOW (ref 8.9–10.3)
Chloride: 100 mmol/L (ref 98–111)
Creatinine, Ser: 1.79 mg/dL — ABNORMAL HIGH (ref 0.44–1.00)
GFR calc Af Amer: 32 mL/min — ABNORMAL LOW (ref >60)
GFR calc non Af Amer: 27 mL/min — ABNORMAL LOW (ref >60)
Glucose, Bld: 179 mg/dL — ABNORMAL HIGH (ref 70–99)
Potassium: 4.3 mmol/L (ref 3.5–5.1)
Sodium: 138 mmol/L (ref 135–145)

## 2019-02-24 MED ORDER — ENOXAPARIN SODIUM 30 MG/0.3ML ~~LOC~~ SOLN
30.0000 mg | SUBCUTANEOUS | Status: DC
  Administered 2019-02-24: 30 mg via SUBCUTANEOUS
  Filled 2019-02-24: qty 0.3

## 2019-02-24 MED ORDER — INSULIN ASPART 100 UNIT/ML ~~LOC~~ SOLN
0.0000 [IU] | Freq: Three times a day (TID) | SUBCUTANEOUS | Status: DC
  Administered 2019-02-24: 5 [IU] via SUBCUTANEOUS
  Administered 2019-02-24: 8 [IU] via SUBCUTANEOUS
  Administered 2019-02-25 (×2): 5 [IU] via SUBCUTANEOUS
  Administered 2019-02-25 – 2019-02-26 (×2): 8 [IU] via SUBCUTANEOUS
  Administered 2019-02-26: 3 [IU] via SUBCUTANEOUS
  Administered 2019-02-26: 8 [IU] via SUBCUTANEOUS
  Administered 2019-02-27: 12:00:00 11 [IU] via SUBCUTANEOUS
  Administered 2019-02-27: 09:00:00 3 [IU] via SUBCUTANEOUS
  Filled 2019-02-24 (×10): qty 1

## 2019-02-24 MED ORDER — INSULIN ASPART 100 UNIT/ML ~~LOC~~ SOLN
0.0000 [IU] | Freq: Three times a day (TID) | SUBCUTANEOUS | Status: DC
  Administered 2019-02-24: 09:00:00 3 [IU] via SUBCUTANEOUS

## 2019-02-24 MED ORDER — ENOXAPARIN SODIUM 40 MG/0.4ML ~~LOC~~ SOLN
40.0000 mg | SUBCUTANEOUS | Status: DC
  Administered 2019-02-25 – 2019-02-27 (×3): 40 mg via SUBCUTANEOUS
  Filled 2019-02-24 (×3): qty 0.4

## 2019-02-24 MED ORDER — INSULIN ASPART 100 UNIT/ML ~~LOC~~ SOLN
SUBCUTANEOUS | Status: AC
  Filled 2019-02-24: qty 1

## 2019-02-24 MED ORDER — INSULIN ASPART 100 UNIT/ML ~~LOC~~ SOLN
0.0000 [IU] | Freq: Every day | SUBCUTANEOUS | Status: DC
  Administered 2019-02-24: 3 [IU] via SUBCUTANEOUS
  Administered 2019-02-25: 4 [IU] via SUBCUTANEOUS
  Administered 2019-02-26: 2 [IU] via SUBCUTANEOUS
  Filled 2019-02-24 (×3): qty 1

## 2019-02-24 MED ORDER — INSULIN GLARGINE 100 UNIT/ML ~~LOC~~ SOLN
30.0000 [IU] | Freq: Every day | SUBCUTANEOUS | Status: DC
  Administered 2019-02-24: 30 [IU] via SUBCUTANEOUS
  Filled 2019-02-24 (×2): qty 0.3

## 2019-02-24 NOTE — TOC Initial Note (Addendum)
Transition of Care [facility]) - Initial/Assessment Note    Patient Details  Name: Karen [name] MRN: [id number] Date of Birth: 29-Jan-1946  Transition of [facility]) CM/SW Contact:    Victorino Dike, RN Phone Number: 02/24/2019, 9:24 AM  Clinical Narrative:                  Met with patient to discuss discharge planning.  Patient lives at home with Husband and Son.  Husband is caregiver.  She reports being able to ambulate with RW or cane without assistance.  At this time, due to current health patient is requiring assistance with ambulation.   Patient does not want to use HH services at this time if needed at discharge.  Mentioned the idea of seeing how her health progresses and recommendations from PT/OT evaluations.    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     Current DME in home:  [place] and patient uses home oxygen.  Expected Discharge Plan: [place] Barriers to Discharge: Continued Medical Work up   Patient Goals and CMS Choice Patient states their goals for this hospitalization and ongoing recovery are:: to go home and not need Home Health      Expected Discharge Plan and Services Expected Discharge Plan: [place]   Discharge Planning Services: CM Consult   Living arrangements for the past 2 months: Single Family Home                                      Prior Living Arrangements/Services Living arrangements for the past 2 months: Single Family Home Lives with:: Self, Adult Children, Spouse Patient language and need for interpreter reviewed:: Yes        Need for Family Participation in Patient Care: No (Comment) Care giver support system in place?: Yes (comment) Current home services: DME, Other (comment)((walker and cane), (Oxygen)) Criminal Activity/Legal Involvement Pertinent to Current Situation/Hospitalization: No - Comment as needed  Activities of Daily Living Home Assistive  Devices/Equipment: Blood pressure cuff, Cane (specify quad or straight), CBG Meter, Grab bars around toilet, Grab bars in shower, Dentures (specify type), Nebulizer, Insulin Pump, Oxygen, Scales, Shower chair with back, Walker (specify type) ADL Screening (condition at time of admission) Patient's cognitive ability adequate to safely complete daily activities?: Yes Is the patient deaf or have difficulty hearing?: No Does the patient have difficulty seeing, even when wearing glasses/contacts?: No Does the patient have difficulty concentrating, remembering, or making decisions?: No Patient able to express need for assistance with ADLs?: Yes Does the patient have difficulty dressing or bathing?: No Independently performs ADLs?: Yes (appropriate for developmental age) Does the patient have difficulty walking or climbing stairs?: Yes Weakness of Legs: Both Weakness of Arms/Hands: Both  Permission Sought/Granted                  Emotional Assessment Appearance:: Appears stated age Attitude/Demeanor/Rapport: Engaged Affect (typically observed): Appropriate Orientation: : Oriented to Self, Oriented to Place, Oriented to  Time, Oriented to Situation Alcohol / Substance Use: Not Applicable Psych Involvement: No (comment)  Admission diagnosis:  SOB (shortness of breath) [R06.02] Symptomatic bradycardia [R00.1] AKI (acute kidney injury) ([place]) [N17.9] Acute on chronic diastolic CHF (congestive heart failure) ([place]) [I50.33] Patient Active Problem List   Diagnosis Date Noted  . Acute on chronic diastolic CHF (congestive heart failure) ([place]) 02/23/2019  . Chronic respiratory  failure with hypoxia ([place]) 02/23/2019  . Atypical chest pain 02/13/2019  . AVM (arteriovenous malformation) of small bowel, acquired   . Acute gastric ulcer with hemorrhage   . Chronic diastolic heart failure ([place]) 08/22/2018  . Diarrhea 08/22/2018  . Junctional bradycardia   . Acute on chronic heart failure with  preserved ejection fraction (HFpEF) ([place])   . AKI (acute kidney injury) ([place])   . Symptomatic bradycardia 08/05/2018  . Acute on chronic respiratory failure ([place]) 06/25/2018  . 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 diabetes mellitus ([place]) 01/25/2018  . History of non anemic vitamin B12 deficiency 01/25/2018  . Personal history of kidney stones 11/12/2017  . Urge incontinence 11/12/2017  . Right ureteral stone 09/15/2017  . Acute GI bleeding   . GI bleed 08/26/2017  . Arthritis 08/10/2017  . Chronic kidney disease 08/10/2017  . COPD (chronic obstructive pulmonary disease) ([place]) 08/10/2017  . Diabetes mellitus type 2, uncomplicated ([place]) 44/96/7591  . Hypertension 08/10/2017  . Obesity (BMI 35.0-39.9 without comorbidity) 04/11/2017  . Primary osteoarthritis of right knee 09/01/2016  . Leucocytosis 10/19/2015  . 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 Acute on chronic respiratory failure with hypoxia and hypercapnia ([place]) 01/07/2015  . Sciatica 01/07/2015  . Weakness 01/07/2015  . 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 Iron deficiency anemia 06/22/2014  . Microalbuminuria 06/22/2014  . CHF (congestive heart failure) ([place]) 04/20/2014  . Edema, peripheral 04/20/2014   PCP:  Ricardo Jericho, NP Pharmacy:   [facility] [address] [place] [address] Phone: [phone] Fax: [phone]     Social Determinants of Health (SDOH) Interventions    Readmission Risk Interventions Readmission Risk Prevention Plan 09/17/2018 09/16/2018 09/16/2018  Transportation Screening Complete Complete -  PCP or Specialist Appt within 5-7 Days - - -  Home Care Screening - - -  Medication Review (RN CM) - - -  Palliative Care Screening - - -  Medication Review ([place]) Complete Complete Complete  PCP or Specialist appointment within 3-5 days of discharge Not Complete - -  PCP/Specialist Appt Not  Complete comments PCP appointment 7/20 - -  [place] or Home Care Consult Complete Complete Complete  Palliative Care Screening Not Applicable Not Applicable -  [place] Not Applicable - -  Some recent data might be hidden

## 2019-02-24 NOTE — Progress Notes (Addendum)
PROGRESS NOTE    Karen [name]  [id number] DOB: 1946/02/05 DOA: 02/23/2019 PCP: Ricardo Jericho, NP      Brief Narrative:  Karen [name] is a 72 y.o. F with COPD on 3L, dCHF, MO, DM on insulin pump, CKD IIIa baseline Cr 1.0 and recent junctional bradycardia due to beta-blockade who presents with bradycardia and dyspnea.  Patient recently admitted with bradycardia, congestive heart failure, AKI due to inadvertent bisoprolol use again.  Cardiology were consulted, beta-blockade was held, she was diuresed 9 L, and her symptoms and kidney failure resolved.  Since then, she was home about 7 days before dyspnea, orthopnea started again.   She noticed that her heart rate was in the 30s again, felt extremely out of breath, so she returned to the ER.  In the ER, she had heart rate 38, BP 129/105.  Creatinine 2.5 (up from 0.9 at discharge 10 days ago).  She had elevated anion gap, normal potassium.  Chest x-ray showed mild congestion and cardiomegaly.  ECG showed junctional rhythm, rate in the 30s.  Troponin was negative.            Assessment & Plan:  Junctional bradycardia Based on the history, we believe this was again from inadvertent bisoprolol use.  Family brought in her pillbox and sure enough, bisoprolol 5 mg was present in the pillbox, as she had been taking it still.  Discussed with her pharmacy, they last refiled in Sept 2020, and it is now deactivated.  Bradycardic but BP stable overnight.  TSH normal.  HR was 60s overnight and sinus, but is back to 40s and junctional this AM. -Hold all beta blockade -Consult Cardiology -Monitor on telemetry   Acute on chronic diastolic CHF Hypertension EF 65 to 70% on echocardiogram earlier this month.   Her bradycardia led to low cardiac output heart failure.  Net negative 2.1L yesterday.  Cr and K imrpoving.  BUN still >100 -Continue Furosemide cautiously, furosemide 40 mg IV once today   -Continue K  supplement -Strict I/Os, daily weights, telemetry  -Daily monitoring renal function -Hold losartan, HCTZ until hemodynamics clearer   Acute kidney injury on CKD IIIa Improving.   BUN still >100.  Likely this is congestive nephropathy. -Hold losartan, HCTZ -Close monitoring creatinine and potassium while diuresing  COPD with chronic hypoxic respiratory failure No wheezing or evidence of bronchospasm on exam. -Continue home ICS LABA LAMA -Continue Singulair, PPI, Flonase  Hypoglycemia Hypoglycemic on arrival here, corrected with oral intake.  Hypoglycemic again overnight, corrected this AM  Insulin-dependent diabetes On home insulin pump without basal dose -Hold home Metformin and canagliflozin -Low dose SS corrections for now only -Continue atorvastatin  Anemia Normocytic, listed in the chart is iron deficiency.  Hgb slightly down today, no clinical bleeding. -Continue iron, B12  Bladder incontinence -Continue Vesicare  Leukocytosis This is chronic, unclear cause.  Doubt infection.        MDM and disposition: The below labs and imaging reports were reviewed and summarized above.  Medication management as above.  The patient was admitted with CHF from beta blocker induced bradycardia.    At this time, continued inpatient services are reasonable and expected, given the patient's her age, comorbid CHF, ongoing IV Lasix, and still with junctional bradycardia in the 40s resulting in ongoing CHF even after 24 hours.  It is my medical opinion that she has a high likelihood of an adverse outcome, including readmission, debility or death if the patient were to be discharged prematurely.  DVT prophylaxis: Lovenox Code Status: FULL Family Communication: Karen [name]    Consultants:   Cardiology      Subjective: No chest pain.  Orthopnea and breathing slightly better.  BP soft, HR low.  No confusion, chest pain.  No dyspnea, respiratory  distresss.  Objective: Vitals:   02/24/19 0005 02/24/19 0032 02/24/19 0429 02/24/19 0810  BP: (!) 111/45 (!) 100/47 (!) 117/42 (!) 125/44  Pulse:  (!) 37 62 63  Resp: 18  20 16   Temp:    98.1 F (36.7 C)  TempSrc:   Oral Oral  SpO2: 100% 100% 97% 96%  Weight:   103 kg   Height:        Intake/Output Summary (Last 24 hours) at 02/24/2019 1211 Last data filed at 02/24/2019 0429 Gross per 24 hour  Intake --  Output 2100 ml  Net -2100 ml   Filed Weights   02/23/19 1522 02/23/19 1936 02/24/19 0429  Weight: 92.6 kg 103.5 kg 103 kg    Examination: General appearance: obese adult female, alert and in no acute distress.  Appears uncomfortable HEENT: Anicteric, conjunctiva pink, lids and lashes normal. No nasal deformity, discharge, epistaxis.  Lips moist.   Skin: Warm and dry.  no jaundice.  No suspicious rashes or lesions. Cardiac: Slow, regular, nl S1-S2, no murmurs appreciated.  Capillary refill is brisk.  JVP not visible. 1+ LE edema.  Radial pulses 2+ and symmetric. Respiratory: Normal respiratory rate and rhythm.  Still crackles. Abdomen: Abdomen soft.  No TTP. No ascites, distension, hepatosplenomegaly.   MSK: No deformities or effusions. Neuro: Awake and alert.  EOMI, moves all extremities. Speech fluent.    Psych: Sensorium intact and responding to questions, attention normal. Affect normal.  Judgment and insight appear normal.    Data Reviewed: I have personally reviewed following labs and imaging studies:  CBC: Recent Labs  Lab 02/23/19 1512 02/24/19 0622  WBC 13.5* 10.9*  HGB 10.6* 9.8*  HCT 33.8* 29.1*  MCV 91.1 86.4  PLT 285 [id number]   Basic Metabolic Panel: Recent Labs  Lab 02/23/19 1512 02/24/19 0622  NA 138 138  K 4.6 4.3  CL 103 100  CO2 18* 27  GLUCOSE 58* 179*  BUN 115* 104*  CREATININE 2.57* 1.79*  CALCIUM 8.9 8.7*   GFR: Estimated Creatinine Clearance: 31.1 mL/min (A) (by C-G formula based on SCr of 1.79 mg/dL (H)). Liver Function  Tests: No results for input(s): AST, ALT, ALKPHOS, BILITOT, PROT, ALBUMIN in the last 168 hours. No results for input(s): LIPASE, AMYLASE in the last 168 hours. No results for input(s): AMMONIA in the last 168 hours. Coagulation Profile: No results for input(s): INR, PROTIME in the last 168 hours. Cardiac Enzymes: No results for input(s): CKTOTAL, CKMB, CKMBINDEX, TROPONINI in the last 168 hours. BNP (last 3 results) No results for input(s): PROBNP in the last 8760 hours. HbA1C: Recent Labs    02/24/19 0622  HGBA1C 8.4*   CBG: Recent Labs  Lab 02/24/19 0205 02/24/19 0340 02/24/19 0530 02/24/19 0811 02/24/19 1107  GLUCAP 58* 84 128* 205* 269*   Lipid Profile: No results for input(s): CHOL, HDL, LDLCALC, TRIG, CHOLHDL, LDLDIRECT in the last 72 hours. Thyroid Function Tests: Recent Labs    02/23/19 1512  TSH 4.029   Anemia Panel: No results for input(s): VITAMINB12, FOLATE, FERRITIN, TIBC, IRON, RETICCTPCT in the last 72 hours. Urine analysis:    Component Value Date/Time   COLORURINE YELLOW (A) 02/13/2019 1855   APPEARANCEUR CLOUDY (A) 02/13/2019 1855  APPEARANCEUR Cloudy (A) 09/28/2017 1338   LABSPEC 1.017 02/13/2019 1855   LABSPEC 1.024 04/02/2014 0517   PHURINE 5.0 02/13/2019 1855   GLUCOSEU NEGATIVE 02/13/2019 1855   GLUCOSEU >=500 04/02/2014 0517   HGBUR NEGATIVE 02/13/2019 1855   BILIRUBINUR NEGATIVE 02/13/2019 1855   BILIRUBINUR Negative 09/28/2017 1338   BILIRUBINUR Negative 04/02/2014 [place] 02/13/2019 1855   PROTEINUR NEGATIVE 02/13/2019 1855   NITRITE NEGATIVE 02/13/2019 1855   LEUKOCYTESUR LARGE (A) 02/13/2019 1855   LEUKOCYTESUR Trace 04/02/2014 0517   Sepsis Labs: @LABRCNTIP (procalcitonin:4,lacticacidven:4)  ) Recent Results (from the past 240 hour(s))  Respiratory Panel by RT PCR (Flu A&B, Covid) - Nasopharyngeal Swab     Status: None   Collection Time: 02/23/19  4:05 PM   Specimen: Nasopharyngeal Swab  Result Value Ref  Range Status   SARS Coronavirus 2 by RT PCR NEGATIVE NEGATIVE Final    Comment: (NOTE) SARS-CoV-2 target nucleic acids are NOT DETECTED. The SARS-CoV-2 RNA is generally detectable in upper respiratoy specimens during the acute phase of infection. The lowest concentration of SARS-CoV-2 viral copies this assay can detect is 131 copies/mL. A negative result does not preclude SARS-Cov-2 infection and should not be used as the sole basis for treatment or other patient management decisions. A negative result may occur with  improper specimen collection/handling, submission of specimen other than nasopharyngeal swab, presence of viral mutation(s) within the areas targeted by this assay, and inadequate number of viral copies (<131 copies/mL). A negative result must be combined with clinical observations, patient history, and epidemiological information. The expected result is Negative. Fact Sheet for Patients:  PinkCheek.be Fact Sheet for Healthcare Providers:  GravelBags.it This test is not yet ap proved or cleared by the [place] FDA and  has been authorized for detection and/or diagnosis of SARS-CoV-2 by FDA under an Emergency Use Authorization (EUA). This EUA will remain  in effect (meaning this test can be used) for the duration of the COVID-19 declaration under Section 564(b)(1) of the Act, 21 U.S.C. section 360bbb-3(b)(1), unless the authorization is terminated or revoked sooner.    Influenza A by PCR NEGATIVE NEGATIVE Final   Influenza B by PCR NEGATIVE NEGATIVE Final    Comment: (NOTE) The Xpert Xpress SARS-CoV-2/FLU/RSV assay is intended as an aid in  the diagnosis of influenza from Nasopharyngeal swab specimens and  should not be used as a sole basis for treatment. Nasal washings and  aspirates are unacceptable for Xpert Xpress SARS-CoV-2/FLU/RSV  testing. Fact Sheet for  Patients: PinkCheek.be Fact Sheet for Healthcare Providers: GravelBags.it This test is not yet approved or cleared by the [place] FDA and  has been authorized for detection and/or diagnosis of SARS-CoV-2 by  FDA under an Emergency Use Authorization (EUA). This EUA will remain  in effect (meaning this test can be used) for the duration of the  Covid-19 declaration under Section 564(b)(1) of the Act, 21  U.S.C. section 360bbb-3(b)(1), unless the authorization is  terminated or revoked. Performed at [facility], [address], [place],  [address]          Radiology Studies: [facility] Chest [place] 1 View  Result Date: 02/23/2019 CLINICAL DATA:  Shortness of breath. EXAM: PORTABLE CHEST 1 VIEW COMPARISON:  February 13, 2019 FINDINGS: Stable cardiomegaly. The hila and mediastinum are normal. Transcutaneous pacer leads overlie the lower left chest obscuring this region. Mild edema suspected. No focal infiltrate. IMPRESSION: Cardiomegaly and mild pulmonary venous congestion/mild edema. No other acute abnormalities identified. Electronically  Signed   By: Dorise Bullion III M.D   On: 02/23/2019 15:46        Scheduled Meds: . atorvastatin  10 mg Oral Daily  . darifenacin  7.5 mg Oral Daily  . [START ON 02/25/2019] enoxaparin (LOVENOX) injection  40 mg Subcutaneous Q24H  . ferrous sulfate  325 mg Oral Daily  . fluticasone  2 spray Each Nare Daily  . fluticasone furoate-vilanterol  1 puff Inhalation Daily   And  . umeclidinium bromide  1 puff Inhalation Daily  . insulin aspart  0-15 Units Subcutaneous TID WC  . insulin aspart  0-5 Units Subcutaneous QHS  . insulin glargine  30 Units Subcutaneous Daily  . montelukast  10 mg Oral QHS  . pantoprazole  40 mg Oral Daily  . vitamin B-12  500 mcg Oral Daily   Continuous Infusions:   LOS: 0 days    Time spent: 25 minutes    Edwin Dada, MD [facility] 02/24/2019, 12:11 PM     Please page though [place] or Epic secure chat:  For [employer], Adult nurse

## 2019-02-24 NOTE — Progress Notes (Addendum)
Inpatient Diabetes Program Recommendations  AACE/ADA: New Consensus Statement on Inpatient Glycemic Control   Target Ranges:  Prepandial:   less than 140 mg/dL      Peak postprandial:   less than 180 mg/dL (1-2 hours)      Critically ill patients:  140 - 180 mg/dL   Results for NUR, [name] (MRN [id number]) as of 02/24/2019 08:41  Ref. Range 02/24/2019 02:05 02/24/2019 03:40 02/24/2019 05:30 02/24/2019 08:11  Glucose-Capillary Latest Ref Range: 70 - 99 mg/dL 58 (L) 84 128 (H) 205 (H)  Results for Karen, [name] (MRN [id number]) as of 02/24/2019 08:41  Ref. Range 02/23/2019 17:43 02/23/2019 18:29 02/23/2019 19:35 02/23/2019 23:14  Glucose-Capillary Latest Ref Range: 70 - 99 mg/dL 43 (LL) 118 (H) 119 (H) 105 (H)   Review of Glycemic Control  Admit with: Acute on Chronic HFpEF/ Junctional Bradycardia  History: DM, COPD, CHF, CKD  Home DM Meds: [id number]  Insulin Pump with Humalog                             Invokana 100 mg Daily                             Metformin 1000 mg BID  Current Orders: Novolog 0-9 units TID with meals   Recommendations:  Insulin-If insulin pump is removed, please consider ordering Lantus 30 units Q24H, add Novolog 0-5 units QHS, and order Novolog 4 units TID with meals if patient eats at least 50% of meals.  Endocrinologist: Dr. Honor Junes and [facility]--Last seen 12/10/2018--Instructed to Continue the Metformin and Invokana.  Also told to Continue her VGO 40 Disposable Insulin pump and use [id number] clicks for small, medium, large meals and Take an extra click if pre-meal CBG is >170 mg/dl and Take 6 clicks if pre-meal CBG is >250 mg/dl.  Also Continue Freestyle Libre CGM.  Spoke with patient over the phone and she states that she has extra pump supplies and that her husband can bring her more to the hospital if needed. Patient states that she has been using her insulin pump and taking oral DM medications since she was discharged  from the hospital and she denies any issues with hypoglycemia at home.  Spoke with Karen Drone, RN and sent chat to Dr. Loleta Books and RN to ask about the insulin pump order set. Dr. Loleta Books is concerned about hypoglycemia and would like patient to remove her insulin pump. Therefore, would recommend ordering basal, meal coverage, and correction insulin at this time.   Pt's insulin pump is a 24 hour disposable pump and she will need to change her pump out every 24 hours.  Pt knows how to do this.  Pt has Freestyle CGM on her arm and often checks her CBGs with this device.  Pt is agreeable to the RN checking her fingerstick CBGs TID AC +2am as ordered.  Asked pt to please tell her RN how much insulin she gives herself with each meal.  Per her VGO pump, pt gets 40 units of a basal rate every 24 hours and will give herself "clicks" with each meals.  Each "click" is equivalent to 2 units insulin.  Addendum 02/24/19@16 :05-Spoke with patient again regarding her V-Go insulin pump. Provided printed information on V-Go and how it works, Explained that the V-Go pump provides a set dose of insulin every hour for 24 hours once it is applied and  that is consider basal insulin. Informed patient that since she has V-Go40 her insulin pump delivers a total of 40 units over 24 hours which is considered her basal insulin. Therefore, when she has on her V-Go she is getting a set dose of insulin every hour. Patient confirms that she uses extra clicks (each click delivers 2 units of insulin) with meals number of clicks depends on size of meal. Patient states that she understands information discussed and she has no questions at this time related to DM. However, patient reports that she has a headache and she has not informed RN.  Had patient press nurse call bell and then went to secretary desk to report Control and instrumentation engineer was already on the phone with the patient as she was reporting headache and secretary told her she would let her nurse  know).  Thanks, Barnie Alderman, RN, MSN, CDE Diabetes Coordinator Inpatient Diabetes Program [phone] (Team Pager from 8am to 5pm)

## 2019-02-24 NOTE — Consult Note (Signed)
Cardiology Consultation:   Patient ID: Karen [name]; [id number]; [birth year]   Admit date: 02/23/2019 Date of Consult: 02/24/2019  Primary Care Provider: Ricardo Jericho, NP Primary Cardiologist: Rockey Situ   Patient Profile:   Karen [name] is a 73 y.o. female with a hx of HFpEF, junctional bradycardia, CKD stage III, GI bleed, DM2, HTN, HLD, chronic hypoxic respiratory failure secondary to COPD on home oxygen, anemia, and obesity who is being seen today for the evaluation of symptomatic bradycardia at the request of Dr. Loleta Books.  History of Present Illness:   Karen [name] has a known history of junctional bradycardia in the setting of prior beta blocker usage. Echocardiograms in 06/2018 and 08/2018 showed low normal LVSF with an EF of 50-55%. In 08/2018, she was admitted with junctional bradycardia and hypotension. Bisoprolol was stopped. She required a brief course of dopamine with subsequent stabilization and discharge without recommendation for PPM at that time. 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 She required 1 unit of pRBCs. She was most recently admitted 12/10-12/13 for junctional bradycardia. Echo at that time showed an EF of 65-70%, no RWMA, trivial MR/TR/PR, and moderately elevated PASP. It was noted she was still taking bisoprolol, despite this medication having been discontinued in 08/2018. She was volume up and improved with IV Lasix, following diuresis of 9 L. Off bisoprolol, her heart rate improved. It was discovered, the patient had picked up a noncancelled prescription of bisprolol from [place].   She returned to [facility] on 12/20 with a 2-day history of worsening SOB, orthopnea, and cough. She is fairly certain she again restarted bisoprolol by accident. She denied any chest pain, presyncope, or syncope. EKG showed junctional bradycardia, 39  bpm, without acute st/t changes, CXR showed cardiomegaly with mild pulmonary venous congestion/edema. Labs showed BUN/SCr 115/2.57-->104/1.79 (baseline around 1), potassium 4.6-->4.3, WBC 13.5-->10.9, HGB 10.6-->9.8, HS-Tn 11-->12, TSH normal, COVID-19/influenza negative, BNP 1897 (622 ten days prior). Telemetry showed the patient presented in a junctional rhythm which persisted until ~ 01:46 AM this morning at which time she converted to sinus rhythm with rates in the 60s to 90s bpm. She was not on any AV nodal blocking medications prior to presentation this time. She has been placed on IV Lasix 40 mg bid with a documented UOP of 2.1 L for the admission to date. Currently, feels much better this morning.   Past Medical History:  Diagnosis Date  . (HFpEF) heart failure with preserved ejection fraction ([place]) 2017   a. 2017 Echo: EF 50%; b. 06/2018 Echo: EF 50-55%; c. 08/2018 Echo: EF 50-55%, Nl RV fxn. RVSP 60.34mmHg. Mild BAE. Mild to mod TR.     Marland Kitchen Acute on chronic respiratory failure with hypoxia and hypercapnia ([place]) 01/07/2015  . Anemia   . 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 Asthma   . Cataract   . CKD (chronic kidney disease), stage III   . COPD (chronic obstructive pulmonary disease) ([place])    (1) 06/2018 tobacco use, home 3L oxygen   . Diabetes mellitus without complication (HCC)    (1) A1C 7.7 (06/2018)  . Edema, peripheral 04/20/2014  . History of kidney stones   . Hyperlipidemia   . Hypertension   . Iron deficiency anemia 06/22/2014  . Junctional bradycardia    a. In setting of beta blocker therapy.  . Leucocytosis 10/19/2015  . Morbid obesity ([place])   .  Overactive bladder   . Primary osteoarthritis of right knee 09/01/2016  . Sciatica 01/07/2015    Past Surgical History:  Procedure Laterality Date  . APPENDECTOMY    . CESAREAN SECTION     x3  . CHOLECYSTECTOMY    . COLONOSCOPY WITH PROPOFOL N/A 08/28/2017   Procedure: COLONOSCOPY WITH PROPOFOL;  Surgeon: Lucilla Lame, MD;  Location: [facility]  ENDOSCOPY;  Service: Endoscopy;  Laterality: N/A;  . COLONOSCOPY WITH PROPOFOL N/A 08/29/2017   Procedure: COLONOSCOPY WITH PROPOFOL;  Surgeon: Lucilla Lame, MD;  Location: [facility] ENDOSCOPY;  Service: Endoscopy;  Laterality: N/A;  . CYSTOSCOPY W/ URETERAL STENT PLACEMENT Right 09/15/2017   Procedure: CYSTOSCOPY WITH RETROGRADE PYELOGRAM/URETERAL STENT PLACEMENT;  Surgeon: Cleon Gustin, MD;  Location: [facility] ORS;  Service: Urology;  Laterality: Right;  . CYSTOSCOPY/URETEROSCOPY/HOLMIUM LASER/STENT PLACEMENT Right 10/09/2017   Procedure: CYSTOSCOPY/URETEROSCOPY/HOLMIUM LASER/STENT PLACEMENT;  Surgeon: Abbie Sons, MD;  Location: [facility] ORS;  Service: Urology;  Laterality: Right;  right Stent exchange  . ESOPHAGOGASTRODUODENOSCOPY (EGD) WITH PROPOFOL N/A 09/16/2018   Procedure: ESOPHAGOGASTRODUODENOSCOPY (EGD) WITH PROPOFOL;  Surgeon: Lin Landsman, MD;  Location: [place];  Service: Gastroenterology;  Laterality: N/A;  . EYE SURGERY       Home Meds: Prior to Admission medications   Medication Sig Start Date End Date Taking? Authorizing Provider  albuterol (VENTOLIN HFA) 108 (90 Base) MCG/ACT inhaler INHALE 2 PUFFS BY MOUTH EVERY 6 HOURS AS NEEDED FOR WHEEZING 01/02/19  Yes [provider]  atorvastatin (LIPITOR) 10 MG tablet Take 1 tablet by mouth daily.   Yes [provider]  canagliflozin (INVOKANA) 100 MG TABS tablet Take 1 tablet by mouth daily.   Yes [provider]  esomeprazole (NEXIUM) 40 MG capsule Take 40 mg by mouth daily. 12/17/18  Yes [provider]  Ferrous Sulfate (IRON) 325 (65 Fe) MG TABS Take 1 tablet by mouth daily. 09/14/17  Yes [provider]  fluticasone (FLONASE) 50 MCG/ACT nasal spray Place 2 sprays into both nostrils daily. 11/15/18  Yes [provider]  Fluticasone-Umeclidin-Vilant 100-62.5-25 MCG/INH AEPB Inhale 1 puff into the lungs daily. 12/25/17  Yes [provider]  furosemide (LASIX) 40 MG  tablet Take 1 tablet (40 mg total) by mouth 2 (two) times daily. 08/29/18  Yes Gollan, Kathlene November, MD  Insulin Human (INSULIN PUMP) SOLN Inject 1 each into the skin 3 times daily with meals, bedtime and 2 AM. Patient taking differently: Inject 1 each into the skin 3 times daily with meals, bedtime and 2 AM. Humalog Insulin 76 units daily 09/18/17  Yes Dustin Flock, MD  ipratropium-albuterol (DUONEB) 0.5-2.5 (3) MG/3ML SOLN Inhale 3 mLs into the lungs 4 (four) times daily. 02/05/19  Yes [provider]  losartan-hydrochlorothiazide (HYZAAR) 100-25 MG tablet Take 1 tablet by mouth daily. 01/05/19  Yes [provider]  metFORMIN (GLUCOPHAGE) 500 MG tablet Take 1,000 mg by mouth 2 (two) times daily with a meal.    Yes [provider]  montelukast (SINGULAIR) 10 MG tablet Take 10 mg by mouth at bedtime.   Yes [provider]  solifenacin (VESICARE) 10 MG tablet Take 1 tablet (10 mg total) by mouth daily. 07/23/18  Yes Stoioff, Ronda Fairly, MD  vitamin B-12 (CYANOCOBALAMIN) 500 MCG tablet Take 500 mcg by mouth daily.   Yes [provider]    Inpatient Medications: Scheduled Meds: . atorvastatin  10 mg Oral Daily  . darifenacin  7.5 mg Oral Daily  . enoxaparin (LOVENOX) injection  30 mg Subcutaneous  Q24H  . ferrous sulfate  325 mg Oral Daily  . fluticasone  2 spray Each Nare Daily  . fluticasone furoate-vilanterol  1 puff Inhalation Daily   And  . umeclidinium bromide  1 puff Inhalation Daily  . furosemide  40 mg Intravenous BID  . insulin aspart  0-9 Units Subcutaneous TID WC  . montelukast  10 mg Oral QHS  . pantoprazole  40 mg Oral Daily  . vitamin B-12  500 mcg Oral Daily   Continuous Infusions:  PRN Meds: acetaminophen **OR** acetaminophen, albuterol, ondansetron **OR** ondansetron (ZOFRAN) IV  Allergies:   Allergies  Allergen Reactions  . Ace Inhibitors Hives  . Beta Adrenergic Blockers     Junctional bradycardia  . Gabapentin Hives  .  Lisinopril Hives  . Lyrica [Pregabalin] Hives  . Shrimp [Shellfish Allergy] Swelling    Swelling of the lips    Social History:   Social History   Socioeconomic History  . Marital status: Married    Spouse name: Not on file  . Number of children: Not on file  . Years of education: Not on file  . Highest education level: Not on file  Occupational History  . Not on file  Tobacco Use  . Smoking status: Former Smoker    Packs/day: 1.00    Years: 20.00    Pack years: 20.00    Quit date: 12/04/1992    Years since quitting: 26.2  . Smokeless tobacco: Never Used  Substance and Sexual Activity  . Alcohol use: No  . Drug use: No  . Sexual activity: Not on file  Other Topics Concern  . Not on file  Social History Narrative  . Not on file   Social Determinants of Health   Financial Resource Strain:   . Difficulty of Paying Living Expenses: Not on file  Food Insecurity:   . Worried About Charity fundraiser in the Last Year: Not on file  . Ran Out of Food in the Last Year: Not on file  Transportation Needs:   . Lack of Transportation (Medical): Not on file  . Lack of Transportation (Non-Medical): Not on file  Physical Activity:   . Days of Exercise per Week: Not on file  . Minutes of Exercise per Session: Not on file  Stress:   . Feeling of Stress : Not on file  Social Connections:   . Frequency of Communication with Friends and Family: Not on file  . Frequency of Social Gatherings with Friends and Family: Not on file  . Attends Religious Services: Not on file  . Active Member of Clubs or Organizations: Not on file  . Attends Archivist Meetings: Not on file  . Marital Status: Not on file  Intimate Partner Violence:   . Fear of Current or Ex-Partner: Not on file  . Emotionally Abused: Not on file  . Physically Abused: Not on file  . Sexually Abused: Not on file     Family History:   Family History  Problem Relation Age of Onset  . Other Mother         unknown medical history  . Other Father        unknown medical history    ROS:  Review of Systems  Constitutional: Positive for malaise/fatigue. Negative for chills, diaphoresis, fever and weight loss.  HENT: Negative for congestion.   Eyes: Negative for discharge and redness.  Respiratory: Positive for cough and shortness of breath. Negative for hemoptysis, sputum production and wheezing.  Cardiovascular: Positive for orthopnea and leg swelling. Negative for chest pain, palpitations, claudication and PND.  Gastrointestinal: Negative for abdominal pain, blood in stool, heartburn, melena, nausea and vomiting.  Genitourinary: Negative for hematuria.  Musculoskeletal: Negative for falls and myalgias.  Skin: Negative for rash.  Neurological: Positive for weakness. Negative for dizziness, tingling, tremors, sensory change, speech change, focal weakness and loss of consciousness.  Endo/Heme/Allergies: Does not bruise/bleed easily.  Psychiatric/Behavioral: Negative for substance abuse. The patient is not nervous/anxious.   All other systems reviewed and are negative.     Physical Exam/Data:   Vitals:   02/24/19 0000 02/24/19 0005 02/24/19 0032 02/24/19 0429  BP: (!) 104/42 (!) 111/45 (!) 100/47 (!) 117/42  Pulse:   (!) 37 62  Resp: 18 18  20   Temp:      TempSrc:    Oral  SpO2: 100% 100% 100% 97%  Weight:    103 kg  Height:        Intake/Output Summary (Last 24 hours) at 02/24/2019 0739 Last data filed at 02/24/2019 0429 Gross per 24 hour  Intake --  Output 2100 ml  Net -2100 ml   Filed Weights   02/23/19 1522 02/23/19 1936 02/24/19 0429  Weight: 92.6 kg 103.5 kg 103 kg   Body mass index is 40.21 kg/m.   Physical Exam: General: Well developed, well nourished, in no acute distress. Head: Normocephalic, atraumatic, sclera non-icteric, no xanthomas, nares without discharge.  Neck: Negative for carotid bruits. JVD not elevated. Lungs: Diminished breath sounds bilaterally  with faint expiratory wheezing. Breathing is unlabored on 3 L via nasal cannula.  Heart: RRR with S1 S2. No murmurs, rubs, or gallops appreciated. Abdomen: Soft, non-tender, non-distended with normoactive bowel sounds. No hepatomegaly. No rebound/guarding. No obvious abdominal masses. Msk:  Strength and tone appear normal for age. Extremities: No clubbing or cyanosis. Trace bilateral pre-tibial edema. Distal pedal pulses are 2+ and equal bilaterally. Neuro: Alert and oriented X 3. No facial asymmetry. No focal deficit. Moves all extremities spontaneously. Psych:  Responds to questions appropriately with a normal affect.   EKG:  The EKG was personally reviewed and demonstrates: junctional bradycardia, 39 bpm, without acute st/t changes Telemetry:  Telemetry was personally reviewed and demonstrates: junctional bradycardia in the 30s bpm until ~ 01:46 AM this morning when she spontaneously converted to SR with heart rates in the 60s to 90s bpm  Weights: Filed Weights   02/23/19 1522 02/23/19 1936 02/24/19 0429  Weight: 92.6 kg 103.5 kg 103 kg    Relevant CV Studies: 2D Echo 02/14/2019: 1. Left ventricular ejection fraction, by visual estimation, is 65 to 70%. The left ventricle has normal function. Left ventricular septal wall thickness was normal. Normal left ventricular posterior wall thickness. There is no left ventricular  hypertrophy.  2. The left ventricle has no regional wall motion abnormalities.  3. Global right ventricle has normal systolic function.The right ventricular size is normal. No increase in right ventricular wall thickness.  4. Left atrial size was normal.  5. Right atrial size was normal.  6. The mitral valve is grossly normal. Trivial mitral valve regurgitation.  7. The tricuspid valve is grossly normal. Tricuspid valve regurgitation is trivial.  8. The aortic valve was not well visualized. Aortic valve regurgitation is not visualized.  9. The pulmonic valve was not  well visualized. Pulmonic valve regurgitation is trivial. 10. The aortic root was not well visualized. 11. Moderately elevated pulmonary artery systolic pressure. 12. The atrial septum is grossly normal.  Laboratory Data:  Chemistry Recent Labs  Lab 02/23/19 1512 02/24/19 0622  NA 138 138  K 4.6 4.3  CL 103 100  CO2 18* 27  GLUCOSE 58* 179*  BUN 115* 104*  CREATININE 2.57* 1.79*  CALCIUM 8.9 8.7*  GFRNONAA 18* 27*  GFRAA 20* 32*  ANIONGAP 17* 11    No results for input(s): PROT, ALBUMIN, AST, ALT, ALKPHOS, BILITOT in the last 168 hours. Hematology Recent Labs  Lab 02/23/19 1512 02/24/19 0622  WBC 13.5* 10.9*  RBC 3.71* 3.37*  HGB 10.6* 9.8*  HCT 33.8* 29.1*  MCV 91.1 86.4  MCH 28.6 29.1  MCHC 31.4 33.7  RDW 15.9* 15.7*  PLT 285 253   Cardiac EnzymesNo results for input(s): TROPONINI in the last 168 hours. No results for input(s): TROPIPOC in the last 168 hours.  BNP Recent Labs  Lab 02/23/19 1512  BNP 1,897.0*    DDimer No results for input(s): DDIMER in the last 168 hours.  Radiology/Studies:  DG Chest Port 1 View  Result Date: 02/23/2019 IMPRESSION: Cardiomegaly and mild pulmonary venous congestion/mild edema. No other acute abnormalities identified. Electronically Signed   By: Dorise Bullion III M.D   On: 02/23/2019 15:46    Assessment and Plan:   1. Symptomatic bradycardia/SSS/junctional bradycardia: -She initially presented with junctional rhythm in 08/2018 which was felt to be exacerbated by bisoprolol in 08/2018 leading to the holding of this medication. She was readmitted in on 02/13/2019 with repeat junctional rhythm after inadvertently picking up a noncancelled prescription of bisoprolol and resuming this medication. Again, with the holding of beta blocker, her junctional rhythm improved. She is now admitted for a second time this month with junctional rhythm which has spontaneously converted to sinus rhythm this morning. BP has been stable overall  with an isolated reading in the [id number] systolic. High sensitivity troponin negative x 2.  -Converted to NSR around 01:46 AM this morning -She feels almost certain she again inadvertently restarted bisoprolol use -Her husband has been asked to bring in her pill box for review to attempt and confirm this -Further recommendations pending this review -Feels significantly better this morning -Continue to hold all AV nodal blocking medications -If it appears she was not taking any bisoprolol, she may require transfer to [facility] for consideration of PPM -Hemodynamically stable, no indication for dopamine, transcutaneous pacing or venous temp wire at this time -TSH normal -Potassium at goal  2. Acute on chronic HFpEF: -She remains volume up, likely in the setting of the above -Diuresed 2.1 L for the admission to date -Continue IV Lasix 40 mg bid with KCl repletion as indicated  -Daily weights -Strict I/O  3. Cardiorenal syndrome/acute on CKD stage III: -Likely exacerbated by junctional rhythm -Improving -Hold lisinopril/HCTZ -Monitor with diuresis   4. Anemia: -Stable   For questions or updates, please contact [place] Please consult [email or web address] for contact info under Cardiology/STEMI.   Signed, Christell Faith, PA-C [place] Pager: [phone] 02/24/2019, 7:39 AM

## 2019-02-24 NOTE — Progress Notes (Signed)
PT Cancellation Note  Patient Details Name: Karen [name] MRN: [id number] DOB: 1945-08-03   Cancelled Treatment:    Reason Eval/Treat Not Completed: Other (comment). Consult received and chart reviewed. Evaluation attempted, however pt busy with RN care, asked therapist to come at another time. Will re-attempt as available.   Edan Serratore 02/24/2019, 10:59 AM Greggory Stallion, PT, DPT [phone]

## 2019-02-24 NOTE — Plan of Care (Signed)
  Problem: Nutrition: Goal: Adequate nutrition will be maintained Outcome: Completed/Met

## 2019-02-24 NOTE — Evaluation (Signed)
Physical Therapy Evaluation Patient Details Name: Karen [name] MRN: [id number] DOB: [birth year] Today's Date: 02/24/2019   History of Present Illness  Pt admitted for acute on chronic CHF with complaints of SOB symptoms. Per notes, may have taken a wrong medicine that caused bradycardia causing admission. Pt with recent admission for similar symptoms. History includes chronic O2 2L, COPD, CHF, DM and CKD.   Clinical Impression  Pt is a pleasant 73 year old female who was admitted for acute on chronic CHF with bradycardia. Pt performs transfers/ambulation with supervision and RW. All mobility performed on 3L of O2 with sats WNL with exertion at 92%. HR prior to activity at 51bpm, improves to 88bpm with exertion. Pt demonstrates all bed mobility/transfers/ambulation at baseline level. Pt doesn't wish to receive any PT as she feels she is at baseline level. Appears safe with ambulation, does short distances at baseline, had good family support. Pt does not require any further PT needs at this time. Pt will be dc in house and does not require follow up. RN aware. Will dc current orders.      Follow Up Recommendations No PT follow up    Equipment Recommendations  None recommended by PT    Recommendations for Other Services       Precautions / Restrictions Precautions Precautions: Fall Restrictions Weight Bearing Restrictions: No      Mobility  Bed Mobility               General bed mobility comments: not performed as pt received sitting at EOB upon arrival  Transfers Overall transfer level: Needs assistance Equipment used: Rolling walker (2 wheeled) Transfers: Sit to/from Stand Sit to Stand: Supervision         General transfer comment: safe technique. UPright posture without no LOB.  Ambulation/Gait Ambulation/Gait assistance: Supervision Gait Distance (Feet): 40 Feet Assistive device: Rolling walker (2 wheeled) Gait Pattern/deviations: Step-through pattern     General Gait Details: slow speed, ambulated in room with safe technique during turns. Monitored HR throughout ambulation.  Stairs            Wheelchair Mobility    Modified Rankin (Stroke Patients Only)       Balance Overall balance assessment: Mild deficits observed, not formally tested                                           Pertinent Vitals/Pain Pain Assessment: No/denies pain    Home Living Family/patient expects to be discharged to:: Private residence Living Arrangements: Spouse/significant other;Children Available Help at Discharge: Family;Available 24 hours/day Type of Home: Mobile home Home Access: Ramped entrance     Home Layout: One level Home Equipment: [place] - 2 wheels;Cane - single point;Grab bars - tub/shower      Prior Function Level of Independence: Independent with assistive device(s)         Comments: ambulating with RW, no falls last 3 months, husband does cooking and housework,     Journalist, newspaper        Extremity/Trunk Assessment   Upper Extremity Assessment Upper Extremity Assessment: Overall WFL for tasks assessed    Lower Extremity Assessment Lower Extremity Assessment: Overall WFL for tasks assessed       Communication   Communication: No difficulties  Cognition Arousal/Alertness: Awake/alert Behavior During Therapy: WFL for tasks assessed/performed Overall Cognitive Status: Within Functional Limits for tasks assessed  General Comments      Exercises     Assessment/Plan    PT Assessment Patent does not need any further PT services  PT Problem List Cardiopulmonary status limiting activity       PT Treatment Interventions      PT Goals (Current goals can be found in the Care Plan section)  Acute Rehab PT Goals Patient Stated Goal: go home PT Goal Formulation: With patient Time For Goal Achievement: 02/24/19 Potential to Achieve  Goals: Good    Frequency     Barriers to discharge        Co-evaluation               AM-PAC PT "6 Clicks" Mobility  Outcome Measure Help needed turning from your back to your side while in a flat bed without using bedrails?: None Help needed moving from lying on your back to sitting on the side of a flat bed without using bedrails?: None Help needed moving to and from a bed to a chair (including a wheelchair)?: None Help needed standing up from a chair using your arms (e.g., wheelchair or bedside chair)?: None Help needed to walk in hospital room?: A Little Help needed climbing 3-5 steps with a railing? : A Little 6 Click Score: 22    End of Session Equipment Utilized During Treatment: Gait belt;Oxygen Activity Tolerance: Patient tolerated treatment well Patient left: in bed(sitting at EOB, RN ok) Nurse Communication: Mobility status PT Visit Diagnosis: Difficulty in walking, not elsewhere classified (R26.2)    Time: [id number] PT Time Calculation (min) (ACUTE ONLY): 14 min   Charges:   PT Evaluation $PT Eval Low Complexity: [place], PT, DPT [phone]   Letita Prentiss 02/24/2019, 4:13 PM

## 2019-02-25 LAB — GLUCOSE, CAPILLARY
Glucose-Capillary: 238 mg/dL — ABNORMAL HIGH (ref 70–99)
Glucose-Capillary: 292 mg/dL — ABNORMAL HIGH (ref 70–99)
Glucose-Capillary: 220 mg/dL — ABNORMAL HIGH (ref 70–99)
Glucose-Capillary: 342 mg/dL — ABNORMAL HIGH (ref 70–99)

## 2019-02-25 LAB — BASIC METABOLIC PANEL
Anion gap: 12 (ref 5–15)
BUN: 81 mg/dL — ABNORMAL HIGH (ref 8–23)
CO2: 29 mmol/L (ref 22–32)
Calcium: 9.3 mg/dL (ref 8.9–10.3)
Chloride: 99 mmol/L (ref 98–111)
Creatinine, Ser: 1.21 mg/dL — ABNORMAL HIGH (ref 0.44–1.00)
GFR calc Af Amer: 51 mL/min — ABNORMAL LOW (ref >60)
GFR calc non Af Amer: 44 mL/min — ABNORMAL LOW (ref >60)
Glucose, Bld: 248 mg/dL — ABNORMAL HIGH (ref 70–99)
Potassium: 4.5 mmol/L (ref 3.5–5.1)
Sodium: 140 mmol/L (ref 135–145)

## 2019-02-25 MED ORDER — CANAGLIFLOZIN 100 MG PO TABS: 100.0000 mg | ORAL_TABLET | Freq: Every day | ORAL | Status: DC

## 2019-02-25 MED ORDER — INSULIN ASPART 100 UNIT/ML ~~LOC~~ SOLN
4.0000 [IU] | Freq: Three times a day (TID) | SUBCUTANEOUS | Status: DC
  Administered 2019-02-25 – 2019-02-27 (×7): 4 [IU] via SUBCUTANEOUS
  Filled 2019-02-25 (×7): qty 1

## 2019-02-25 MED ORDER — INSULIN GLARGINE 100 UNIT/ML ~~LOC~~ SOLN
40.0000 [IU] | Freq: Every day | SUBCUTANEOUS | Status: DC
  Administered 2019-02-25 – 2019-02-27 (×3): 40 [IU] via SUBCUTANEOUS
  Filled 2019-02-25 (×4): qty 0.4

## 2019-02-25 MED ORDER — FUROSEMIDE 40 MG PO TABS
40.0000 mg | ORAL_TABLET | Freq: Two times a day (BID) | ORAL | Status: DC
  Administered 2019-02-25 – 2019-02-26 (×3): 40 mg via ORAL
  Filled 2019-02-25 (×2): qty 1
  Filled 2019-02-25: qty 2

## 2019-02-25 NOTE — Progress Notes (Signed)
Progress Note  Patient Name: Karen [name] Date of Encounter: 02/25/2019  Primary Cardiologist: Rockey Situ  Subjective   No complaints this morning. Recurrent junctional rhythm around 09:15 on 12/21 lasting until ~ 21:30 with spontaneous conversion back to sinus. Short episode of junction rhythm around 02:00 this morning. Currently in NSR with heart rates in the 60s bpm. Documented UOP of 2.7 L for the past 24 hours with a net - 4.8 L for the admission. Weight 103-->99.7 kg. Renal function improving.   Inpatient Medications    Scheduled Meds: . atorvastatin  10 mg Oral Daily  . darifenacin  7.5 mg Oral Daily  . enoxaparin (LOVENOX) injection  40 mg Subcutaneous Q24H  . ferrous sulfate  325 mg Oral Daily  . fluticasone  2 spray Each Nare Daily  . fluticasone furoate-vilanterol  1 puff Inhalation Daily   And  . umeclidinium bromide  1 puff Inhalation Daily  . insulin aspart  0-15 Units Subcutaneous TID WC  . insulin aspart  0-5 Units Subcutaneous QHS  . insulin glargine  30 Units Subcutaneous Daily  . montelukast  10 mg Oral QHS  . pantoprazole  40 mg Oral Daily  . vitamin B-12  500 mcg Oral Daily   Continuous Infusions:  PRN Meds: acetaminophen **OR** acetaminophen, albuterol, ondansetron **OR** ondansetron (ZOFRAN) IV   Vital Signs    Vitals:   02/24/19 1633 02/24/19 1923 02/25/19 0342 02/25/19 0740  BP:  (!) 116/56 (!) 128/94 126/64  Pulse:  (!) 45 65 63  Resp:    14  Temp:  98.1 F (36.7 C) 97.6 F (36.4 C) (!) 97.5 F (36.4 C)  TempSrc:  Oral Oral Oral  SpO2: 99% 100% 97% 98%  Weight:   99.7 kg   Height:        Intake/Output Summary (Last 24 hours) at 02/25/2019 0742 Last data filed at 02/25/2019 0522 Gross per 24 hour  Intake --  Output 2700 ml  Net -2700 ml   Filed Weights   02/23/19 1936 02/24/19 0429 02/25/19 0342  Weight: 103.5 kg 103 kg 99.7 kg    Telemetry    Recurrent junctional rhythm around 09:15 on 12/21 lasting until ~ 21:30 with  spontaneous conversion back to sinus. Short episode of junction rhythm around 02:00 this morning. Currently in NSR with heart rates in the 60s bpm - Personally Reviewed  ECG    No new tracings - Personally Reviewed  Physical Exam   GEN: No acute distress.   Neck: No JVD. Cardiac: RRR, no murmurs, rubs, or gallops.  Respiratory: Clear to auscultation bilaterally.  GI: Soft, nontender, non-distended.   MS: No edema; No deformity. Neuro:  Alert and oriented x 3; Nonfocal.  Psych: Normal affect.  Labs    Chemistry Recent Labs  Lab 02/23/19 1512 02/24/19 0622 02/25/19 0444  NA 138 138 140  K 4.6 4.3 4.5  CL 103 100 99  CO2 18* 27 29  GLUCOSE 58* 179* 248*  BUN 115* 104* 81*  CREATININE 2.57* 1.79* 1.21*  CALCIUM 8.9 8.7* 9.3  GFRNONAA 18* 27* 44*  GFRAA 20* 32* 51*  ANIONGAP 17* 11 12     Hematology Recent Labs  Lab 02/23/19 1512 02/24/19 0622  WBC 13.5* 10.9*  RBC 3.71* 3.37*  HGB 10.6* 9.8*  HCT 33.8* 29.1*  MCV 91.1 86.4  MCH 28.6 29.1  MCHC 31.4 33.7  RDW 15.9* 15.7*  PLT 285 253    Cardiac EnzymesNo results for input(s): TROPONINI in  the last 168 hours. No results for input(s): TROPIPOC in the last 168 hours.   BNP Recent Labs  Lab 02/23/19 1512  BNP 1,897.0*     DDimer No results for input(s): DDIMER in the last 168 hours.   Radiology    DG Chest Port 1 View  Result Date: 02/23/2019 IMPRESSION: Cardiomegaly and mild pulmonary venous congestion/mild edema. No other acute abnormalities identified. Electronically Signed   By: Dorise Bullion III M.D   On: 02/23/2019 15:46    Cardiac Studies   2D Echo 02/14/2019: 1. Left ventricular ejection fraction, by visual estimation, is 65 to 70%. The left ventricle has normal function. Left ventricular septal wall thickness was normal. Normal left ventricular posterior wall thickness. There is no left ventricular  hypertrophy. 2. The left ventricle has no regional wall motion abnormalities. 3.  Global right ventricle has normal systolic function.The right ventricular size is normal. No increase in right ventricular wall thickness. 4. Left atrial size was normal. 5. Right atrial size was normal. 6. The mitral valve is grossly normal. Trivial mitral valve regurgitation. 7. The tricuspid valve is grossly normal. Tricuspid valve regurgitation is trivial. 8. The aortic valve was not well visualized. Aortic valve regurgitation is not visualized. 9. The pulmonic valve was not well visualized. Pulmonic valve regurgitation is trivial. 10. The aortic root was not well visualized. 11. Moderately elevated pulmonary artery systolic pressure. 12. The atrial septum is grossly normal.  Patient Profile     73 y.o. female with history of HFpEF, junctional bradycardia, CKD stage III, GI bleed, DM2, HTN, HLD, chronic hypoxic respiratory failure secondary to COPD on home oxygen, anemia, and obesity who is being seen today for the evaluation of symptomatic bradycardia.  Assessment & Plan    1. Symptomatic bradycardia/SSS/junctional bradycardia: -She initially presented with junctional rhythm in 08/2018 which was felt to be exacerbated by bisoprolol in 08/2018 leading to the holding of this medication. She was readmitted in on 02/13/2019 with repeat junctional rhythm after inadvertently picking up a noncancelled prescription of bisoprolol and resuming this medication. Again, with the holding of beta blocker, her junctional rhythm improved. She is now admitted for a second time this month with junctional rhythm in the setting of inadvertent resumption of bisoprolol  -She has had subsequent episodes of junctional rhythm over the past 24 hours, possibly in the setting of slow clearance of bisoprolol with AKI -Continue to monitor today, if she continues to have junctional episodes, she may require transfer to [facility] for OOM -Feels significantly better this morning -Continue to hold all AV nodal blocking  medications -Hemodynamically stable, no indication for dopamine, transcutaneous pacing or venous temp wire at this time -TSH normal -Potassium at goal  2. Acute on chronic HFpEF: -Diuresed 4.8 L for the admission to date -Diurese as needed  -Daily weights -Strict I/O  3. Cardiorenal syndrome/acute on CKD stage III: -Improving -Likely exacerbated by junctional rhythm -Hold lisinopril/HCTZ -Monitor with diuresis   4. Anemia: -Stable  For questions or updates, please contact [place] Please consult [email or web address] for contact info under Cardiology/STEMI.    Signed, Christell Faith, PA-C [place] Pager: [phone] 02/25/2019, 7:42 AM

## 2019-02-25 NOTE — Progress Notes (Signed)
Inpatient Diabetes Program Recommendations  AACE/ADA: New Consensus Statement on Inpatient Glycemic Control   Target Ranges:  Prepandial:   less than 140 mg/dL      Peak postprandial:   less than 180 mg/dL (1-2 hours)      Critically ill patients:  140 - 180 mg/dL  Results for BRETT, KITCH (MRN [id number]) as of 02/25/2019 07:24  Ref. Range 02/25/2019 04:44  Glucose Latest Ref Range: 70 - 99 mg/dL 248 (H)   Results for CECE, CHRISTOPHE (MRN [id number]) as of 02/25/2019 07:24  Ref. Range 02/24/2019 08:11 02/24/2019 11:07 02/24/2019 16:28 02/24/2019 20:47  Glucose-Capillary Latest Ref Range: 70 - 99 mg/dL 205 (H) 269 (H) 231 (H) 292 (H)   Review of Glycemic Control  Admit with:Acute on Chronic HFpEF/Junctional Bradycardia  History:DM, COPD, CHF, CKD  Home DM Meds:[id number]  Insulin Pump with Humalog Invokana 100 mg Daily Metformin 1000 mg BID  Current Orders: Lantus 30 units daily,Novolog 0-15 units TID with meals, Novolog 0-5 units QHS  Inpatient Diabetes Program Recommendations:   Insulin-Basal: Please consider increasing Lantus to 40 units daily.  Insulin-Meal Coverage: Please consider ordering Novolog 4 units TID with meals for meal coverage if patient eats at least 50% of meals.  Thanks, Barnie Alderman, RN, MSN, CDE Diabetes Coordinator Inpatient Diabetes Program [phone] (Team Pager from 8am to 5pm)

## 2019-02-25 NOTE — Progress Notes (Signed)
PROGRESS NOTE    Karen [name]  [id number] DOB: 22-Aug-1945 DOA: 02/23/2019 PCP: Ricardo Jericho, NP      Brief Narrative:  Karen [name] is a 73 y.o. F with COPD on 3L, dCHF, MO, DM on insulin pump, CKD IIIa baseline Cr 1.0 and recent junctional bradycardia due to beta-blockade who presents with bradycardia and dyspnea.  Patient recently admitted with bradycardia, congestive heart failure, AKI due to inadvertent bisoprolol use again.  Cardiology were consulted, beta-blockade was held, she was diuresed 9 L, and her symptoms and kidney failure resolved.  Since then, she was home about 7 days before dyspnea, orthopnea started again.   She noticed that her heart rate was in the 30s again, felt extremely out of breath, so she returned to the ER.  In the ER, she had heart rate 38, BP 129/105.  Creatinine 2.5 (up from 0.9 at discharge 10 days ago).  She had elevated anion gap, normal potassium.  Chest x-ray showed mild congestion and cardiomegaly.  ECG showed junctional rhythm, rate in the 30s.  Troponin was negative.            Assessment & Plan:  Junctional bradycardia Patient presented with 3 days progressive CHF symptoms and found to be in junctional bradycardia again.  Family brought in her pillbox and sure enough, bisoprolol 5 mg was present in the pillbox for this week.  Discussed with her pharmacy, they last refilled in Sept 2020, and it is now deactivated.  The pill was shown to the patient and her husband, and removed from the pillbox.    Bradycardic most of yesterday, but aside for a brief episode last night, her HR is now back to sinus, rates 60s.  TSH normal.  -Hold all beta blockade -Monitor on telemetry   Acute on chronic diastolic CHF Hypertension EF 65 to 70% on echocardiogram earlier this month.   Her bradycardia led to low cardiac output heart failure.  Got furosemide x2 yesterday, Net negative 2.7L yesterday, -4.8 on admission, symptoms  better.  Cr and K improving.  BUN down to 80 -Resume home furosemide -Continue K supplement -Strict I/Os, daily weights, telemetry  -Daily monitoring renal function -Hold losartan, HCTZ for now  Acute kidney injury on CKD IIIa Congestive nephropathy. Improving to 1.2 today, (Baseline is 0.9) -Hold losartan, hydrochlorothiazide -Resume oral diuretics -Close monitoring creatinine and potassium while diuresing  COPD with chronic hypoxic respiratory failure No wheezing or evidence of bronchospasm on exam. -Continue home ICS LABA LAMA -Continue Singulair, PPI, Flonase  Hypoglycemia Resolved  Insulin-dependent diabetes On Vgo at home. -Hold metformin -Restart canagliflozin -Continue corrections -Continue Lantus, increase dose -Continue atorvastatin  Anemia Normocytic, listed in the chart is iron deficiency.  Hgb slightly down today, no clinical bleeding. -Continue iron, B12  Bladder incontinence -Continue Vesicare  Leukocytosis This is chronic, unclear cause.  Doubt infection.        MDM and disposition: The below labs and imaging reports reviewed and summarized above.  Medication management as above.   The patient was admitted with CHF from beta blocker induced bradycardia.    We have diuresed nearly 5L and she is imrpoving in symptoms.  PT recommend no follow up.   At present, we will transition to oral lasix.  If her renal function is stable, she remains net negative or net even on oral diuretic, we will get home tomorrow.          DVT prophylaxis: Lovenox Code Status: FULL Family Communication: Husband at  bedside    Consultants:   Cardiology      Subjective: No new chest pain, orthopnea, swelling, confusion, tachycardia, syncope, dyspnea, respiratory distress.     Objective: Vitals:   02/24/19 1633 02/24/19 1923 02/25/19 0342 02/25/19 0740  BP:  (!) 116/56 (!) 128/94 126/64  Pulse:  (!) 45 65 63  Resp:    14  Temp:  98.1 F  (36.7 C) 97.6 F (36.4 C) (!) 97.5 F (36.4 C)  TempSrc:  Oral Oral Oral  SpO2: 99% 100% 97% 98%  Weight:   99.7 kg   Height:        Intake/Output Summary (Last 24 hours) at 02/25/2019 1637 Last data filed at 02/25/2019 1113 Gross per 24 hour  Intake 480 ml  Output 3300 ml  Net -2820 ml   Filed Weights   02/23/19 1936 02/24/19 0429 02/25/19 0342  Weight: 103.5 kg 103 kg 99.7 kg    Examination: General appearance: Obese elderly adult female, alert and in no acute distress.  Appears listless and tired. HEENT: Anicteric, conjunctiva pink, lids and lashes normal. No nasal deformity, discharge, epistaxis.  Lips moist, oropharynx moist, no oral lesions.   Skin: Warm and dry.  No suspicious rashes or lesions. Cardiac: RRR, no murmurs appreciated.  Trace LE edema.    Respiratory: Normal respiratory rate and rhythm.  No rales or wheezing. Abdomen: Abdomen soft.  No tenderness palpation or guarding. No ascites, distension, hepatosplenomegaly.   MSK: No deformities or effusions of the large joints of the upper or lower extremities bilaterally. Neuro: Awake and alert. Naming is grossly intact, and the patient's recall, recent and remote, as well as general fund of knowledge seem within normal limits.  Muscle tone normal, without fasciculations.  Moves all extremities equally and with normal coordination.  Marland Kitchen Speech fluent.    Psych: Sensorium intact and responding to questions, attention normal. Affect normal.  Judgment and insight appear normal.      Data Reviewed: I have personally reviewed following labs and imaging studies:  CBC: Recent Labs  Lab 02/23/19 1512 02/24/19 0622  WBC 13.5* 10.9*  HGB 10.6* 9.8*  HCT 33.8* 29.1*  MCV 91.1 86.4  PLT 285 [id number]   Basic Metabolic Panel: Recent Labs  Lab 02/23/19 1512 02/24/19 0622 02/25/19 0444  NA 138 138 140  K 4.6 4.3 4.5  CL 103 100 99  CO2 18* 27 29  GLUCOSE 58* 179* 248*  BUN 115* 104* 81*  CREATININE 2.57* 1.79* 1.21*   CALCIUM 8.9 8.7* 9.3   GFR: Estimated Creatinine Clearance: 45.2 mL/min (A) (by C-G formula based on SCr of 1.21 mg/dL (H)). Liver Function Tests: No results for input(s): AST, ALT, ALKPHOS, BILITOT, PROT, ALBUMIN in the last 168 hours. No results for input(s): LIPASE, AMYLASE in the last 168 hours. No results for input(s): AMMONIA in the last 168 hours. Coagulation Profile: No results for input(s): INR, PROTIME in the last 168 hours. Cardiac Enzymes: No results for input(s): CKTOTAL, CKMB, CKMBINDEX, TROPONINI in the last 168 hours. BNP (last 3 results) No results for input(s): PROBNP in the last 8760 hours. HbA1C: Recent Labs    02/24/19 0622  HGBA1C 8.4*   CBG: Recent Labs  Lab 02/24/19 1107 02/24/19 1628 02/24/19 2047 02/25/19 0741 02/25/19 1120  GLUCAP 269* 231* 292* 238* 292*   Lipid Profile: No results for input(s): CHOL, HDL, LDLCALC, TRIG, CHOLHDL, LDLDIRECT in the last 72 hours. Thyroid Function Tests: Recent Labs    02/23/19 1512  TSH 4.029  Anemia Panel: No results for input(s): VITAMINB12, FOLATE, FERRITIN, TIBC, IRON, RETICCTPCT in the last 72 hours. Urine analysis:    Component Value Date/Time   COLORURINE YELLOW (A) 02/13/2019 1855   APPEARANCEUR CLOUDY (A) 02/13/2019 1855   APPEARANCEUR Cloudy (A) 09/28/2017 1338   LABSPEC 1.017 02/13/2019 1855   LABSPEC 1.024 04/02/2014 0517   PHURINE 5.0 02/13/2019 1855   GLUCOSEU NEGATIVE 02/13/2019 1855   GLUCOSEU >=500 04/02/2014 0517   HGBUR NEGATIVE 02/13/2019 1855   BILIRUBINUR NEGATIVE 02/13/2019 1855   BILIRUBINUR Negative 09/28/2017 1338   BILIRUBINUR Negative 04/02/2014 0517   KETONESUR NEGATIVE 02/13/2019 1855   PROTEINUR NEGATIVE 02/13/2019 1855   NITRITE NEGATIVE 02/13/2019 1855   LEUKOCYTESUR LARGE (A) 02/13/2019 1855   LEUKOCYTESUR Trace 04/02/2014 0517   Sepsis Labs: @LABRCNTIP (procalcitonin:4,lacticacidven:4)  ) Recent Results (from the past 240 hour(s))  Respiratory Panel by RT  PCR (Flu A&B, Covid) - Nasopharyngeal Swab     Status: None   Collection Time: 02/23/19  4:05 PM   Specimen: Nasopharyngeal Swab  Result Value Ref Range Status   SARS Coronavirus 2 by RT PCR NEGATIVE NEGATIVE Final    Comment: (NOTE) SARS-CoV-2 target nucleic acids are NOT DETECTED. The SARS-CoV-2 RNA is generally detectable in upper respiratoy specimens during the acute phase of infection. The lowest concentration of SARS-CoV-2 viral copies this assay can detect is 131 copies/mL. A negative result does not preclude SARS-Cov-2 infection and should not be used as the sole basis for treatment or other patient management decisions. A negative result may occur with  improper specimen collection/handling, submission of specimen other than nasopharyngeal swab, presence of viral mutation(s) within the areas targeted by this assay, and inadequate number of viral copies (<131 copies/mL). A negative result must be combined with clinical observations, patient history, and epidemiological information. The expected result is Negative. Fact Sheet for Patients:  PinkCheek.be Fact Sheet for Healthcare Providers:  GravelBags.it This test is not yet ap proved or cleared by the [place] FDA and  has been authorized for detection and/or diagnosis of SARS-CoV-2 by FDA under an Emergency Use Authorization (EUA). This EUA will remain  in effect (meaning this test can be used) for the duration of the COVID-19 declaration under Section 564(b)(1) of the Act, 21 U.S.C. section 360bbb-3(b)(1), unless the authorization is terminated or revoked sooner.    Influenza A by PCR NEGATIVE NEGATIVE Final   Influenza B by PCR NEGATIVE NEGATIVE Final    Comment: (NOTE) The Xpert Xpress SARS-CoV-2/FLU/RSV assay is intended as an aid in  the diagnosis of influenza from Nasopharyngeal swab specimens and  should not be used as a sole basis for treatment. Nasal  washings and  aspirates are unacceptable for Xpert Xpress SARS-CoV-2/FLU/RSV  testing. Fact Sheet for Patients: PinkCheek.be Fact Sheet for Healthcare Providers: GravelBags.it This test is not yet approved or cleared by the [place] FDA and  has been authorized for detection and/or diagnosis of SARS-CoV-2 by  FDA under an Emergency Use Authorization (EUA). This EUA will remain  in effect (meaning this test can be used) for the duration of the  Covid-19 declaration under Section 564(b)(1) of the Act, 21  U.S.C. section 360bbb-3(b)(1), unless the authorization is  terminated or revoked. Performed at [facility], [address], [place], [place] [address]          Radiology Studies: No results found.      Scheduled Meds: . atorvastatin  10 mg Oral Daily  . canagliflozin  100 mg Oral  QAC breakfast  . darifenacin  7.5 mg Oral Daily  . enoxaparin (LOVENOX) injection  40 mg Subcutaneous Q24H  . ferrous sulfate  325 mg Oral Daily  . fluticasone  2 spray Each Nare Daily  . fluticasone furoate-vilanterol  1 puff Inhalation Daily   And  . umeclidinium bromide  1 puff Inhalation Daily  . furosemide  40 mg Oral BID  . insulin aspart  0-15 Units Subcutaneous TID WC  . insulin aspart  0-5 Units Subcutaneous QHS  . insulin aspart  4 Units Subcutaneous TID WC  . insulin glargine  40 Units Subcutaneous Daily  . montelukast  10 mg Oral QHS  . pantoprazole  40 mg Oral Daily  . vitamin B-12  500 mcg Oral Daily   Continuous Infusions:   LOS: 1 day    Time spent: 25 minutes    Edwin Dada, MD [facility] 02/25/2019, 4:37 PM     Please page though [place] or Epic secure chat:  For [employer], Adult nurse

## 2019-02-25 NOTE — Progress Notes (Signed)
OT Cancellation Note  Patient Details Name: Karen [name] MRN: [id number] DOB: 1945/06/25   Cancelled Treatment:    Reason Eval/Treat Not Completed: Patient declined, no reason specified;OT screened, no needs identified, will sign off. Consult received, chart reviewed. Upon attempt, pt seated EOB eating breakfast. Denies complaints other than 8/10 headache. RN notified per pt's request. Pt denies deficits and reports back to baseline. Able to mimic LB dressing without assist. Denies SOB. O2 96% and HR 66 seated EOB and on 3L O2 which is pt's baseline O2 use at home. Pt reports good family support and AE/DME available at home. Pt denies OT needs at this time. Will sign off. Please re-consult if additional acute OT needs arise.   Jeni Salles, MPH, MS, OTR/L ascom [phone] 02/25/19, 9:07 AM

## 2019-02-26 DIAGNOSIS — E1142 Type 2 diabetes mellitus with diabetic polyneuropathy: Secondary | ICD-10-CM

## 2019-02-26 DIAGNOSIS — J449 Chronic obstructive pulmonary disease, unspecified: Secondary | ICD-10-CM

## 2019-02-26 DIAGNOSIS — J9611 Chronic respiratory failure with hypoxia: Secondary | ICD-10-CM

## 2019-02-26 DIAGNOSIS — N179 Acute kidney failure, unspecified: Secondary | ICD-10-CM

## 2019-02-26 DIAGNOSIS — J811 Chronic pulmonary edema: Secondary | ICD-10-CM

## 2019-02-26 LAB — GLUCOSE, CAPILLARY
Glucose-Capillary: 197 mg/dL — ABNORMAL HIGH (ref 70–99)
Glucose-Capillary: 298 mg/dL — ABNORMAL HIGH (ref 70–99)
Glucose-Capillary: 280 mg/dL — ABNORMAL HIGH (ref 70–99)
Glucose-Capillary: 223 mg/dL — ABNORMAL HIGH (ref 70–99)

## 2019-02-26 LAB — BASIC METABOLIC PANEL
Anion gap: 8 (ref 5–15)
BUN: 61 mg/dL — ABNORMAL HIGH (ref 8–23)
CO2: 33 mmol/L — ABNORMAL HIGH (ref 22–32)
Calcium: 9 mg/dL (ref 8.9–10.3)
Chloride: 96 mmol/L — ABNORMAL LOW (ref 98–111)
Creatinine, Ser: 1.24 mg/dL — ABNORMAL HIGH (ref 0.44–1.00)
GFR calc Af Amer: 49 mL/min — ABNORMAL LOW (ref >60)
GFR calc non Af Amer: 42 mL/min — ABNORMAL LOW (ref >60)
Glucose, Bld: 195 mg/dL — ABNORMAL HIGH (ref 70–99)
Potassium: 4.2 mmol/L (ref 3.5–5.1)
Sodium: 137 mmol/L (ref 135–145)

## 2019-02-26 MED ORDER — FUROSEMIDE 10 MG/ML IJ SOLN
40.0000 mg | Freq: Four times a day (QID) | INTRAMUSCULAR | Status: AC
  Administered 2019-02-26 (×2): 40 mg via INTRAVENOUS
  Filled 2019-02-26 (×2): qty 4

## 2019-02-26 NOTE — Progress Notes (Signed)
PROGRESS NOTE    Karen [name]  [id number] DOB: [birth year] DOA: 02/23/2019 PCP: Ricardo Jericho, NP    Brief Narrative:  Mrs. Parham is a 73 y.o. F with COPD on 3L, dCHF, MO, DM on insulin pump, CKD IIIa baseline Cr 1.0 and recent junctional bradycardia due to beta-blockadewho presents with bradycardia and dyspnea.  Patient recently admitted with bradycardia, congestive heart failure, AKI due to inadvertent bisoprolol use again.  Cardiology were consulted, beta-blockade was held, she was diuresed 9 L, and her symptoms and kidney failure resolved.  Since then, she was home about 7 days before dyspnea, orthopnea started again.   She noticed that her heart rate was in the 30s again, felt extremely out of breath, so she returned to the ER.  In the ER, she had heart rate 38,BP129/105. Creatinine 2.5 (up from 0.9 at discharge 10 days ago). She had elevated anion gap, normal potassium. Chest x-ray showed mild congestion and cardiomegaly. ECG showed junctional rhythm, rate in the 30s. Troponin was negative.     Consultants:   Cardiology  Procedures: None  Antimicrobials:   None   Subjective: Patient has no complaints.  States she is not short of breath or has chest pain.  Objective: Vitals:   02/25/19 1932 02/26/19 0318 02/26/19 0721 02/26/19 1529  BP: (!) 121/44 (!) 154/56 (!) 160/53 (!) 151/56  Pulse: (!) 48 70 74 68  Resp: 18 18  (!) 24  Temp: (!) 97.4 F (36.3 C) 98.2 F (36.8 C) 98.4 F (36.9 C) 99 F (37.2 C)  TempSrc: Oral Oral Oral Oral  SpO2: 99% 98% 96% 97%  Weight:  101.6 kg    Height:        Intake/Output Summary (Last 24 hours) at 02/26/2019 1625 Last data filed at 02/26/2019 1447 Gross per 24 hour  Intake 340 ml  Output 4300 ml  Net -3960 ml   Filed Weights   02/24/19 0429 02/25/19 0342 02/26/19 0318  Weight: 103 kg 99.7 kg 101.6 kg    Examination:  General exam: Appears calm and comfortable  Respiratory system:  minimal scattered rales,  Respiratory effort normal. Cardiovascular system: S1 & S2 heard, RRR. No JVD, murmurs, rubs, gallops or clicks.  Gastrointestinal system: Abdomen is nondistended, soft and nontender. Normal bowel sounds heard. Central nervous system: Alert and oriented. No focal neurological deficits. Extremities: mild edema b/l Skin: warm, dry Psychiatry: Judgement and insight appear normal. Mood & affect appropriate.     Data Reviewed: I have personally reviewed following labs and imaging studies  CBC: Recent Labs  Lab 02/23/19 1512 02/24/19 0622  WBC 13.5* 10.9*  HGB 10.6* 9.8*  HCT 33.8* 29.1*  MCV 91.1 86.4  PLT 285 [id number]   Basic Metabolic Panel: Recent Labs  Lab 02/23/19 1512 02/24/19 0622 02/25/19 0444 02/26/19 0355  NA 138 138 140 137  K 4.6 4.3 4.5 4.2  CL 103 100 99 96*  CO2 18* 27 29 33*  GLUCOSE 58* 179* 248* 195*  BUN 115* 104* 81* 61*  CREATININE 2.57* 1.79* 1.21* 1.24*  CALCIUM 8.9 8.7* 9.3 9.0   GFR: Estimated Creatinine Clearance: 44.6 mL/min (A) (by C-G formula based on SCr of 1.24 mg/dL (H)). Liver Function Tests: No results for input(s): AST, ALT, ALKPHOS, BILITOT, PROT, ALBUMIN in the last 168 hours. No results for input(s): LIPASE, AMYLASE in the last 168 hours. No results for input(s): AMMONIA in the last 168 hours. Coagulation Profile: No results for input(s): INR, PROTIME in the last  168 hours. Cardiac Enzymes: No results for input(s): CKTOTAL, CKMB, CKMBINDEX, TROPONINI in the last 168 hours. BNP (last 3 results) No results for input(s): PROBNP in the last 8760 hours. HbA1C: Recent Labs    02/24/19 0622  HGBA1C 8.4*   CBG: Recent Labs  Lab 02/25/19 1120 02/25/19 1649 02/25/19 2147 02/26/19 0722 02/26/19 1128  GLUCAP 292* 220* 342* 197* 298*   Lipid Profile: No results for input(s): CHOL, HDL, LDLCALC, TRIG, CHOLHDL, LDLDIRECT in the last 72 hours. Thyroid Function Tests: No results for input(s): TSH, T4TOTAL,  FREET4, T3FREE, THYROIDAB in the last 72 hours. Anemia Panel: No results for input(s): VITAMINB12, FOLATE, FERRITIN, TIBC, IRON, RETICCTPCT in the last 72 hours. Sepsis Labs: No results for input(s): PROCALCITON, LATICACIDVEN in the last 168 hours.  Recent Results (from the past 240 hour(s))  Respiratory Panel by RT PCR (Flu A&B, Covid) - Nasopharyngeal Swab     Status: None   Collection Time: 02/23/19  4:05 PM   Specimen: Nasopharyngeal Swab  Result Value Ref Range Status   SARS Coronavirus 2 by RT PCR NEGATIVE NEGATIVE Final    Comment: (NOTE) SARS-CoV-2 target nucleic acids are NOT DETECTED. The SARS-CoV-2 RNA is generally detectable in upper respiratoy specimens during the acute phase of infection. The lowest concentration of SARS-CoV-2 viral copies this assay can detect is 131 copies/mL. A negative result does not preclude SARS-Cov-2 infection and should not be used as the sole basis for treatment or other patient management decisions. A negative result may occur with  improper specimen collection/handling, submission of specimen other than nasopharyngeal swab, presence of viral mutation(s) within the areas targeted by this assay, and inadequate number of viral copies (<131 copies/mL). A negative result must be combined with clinical observations, patient history, and epidemiological information. The expected result is Negative. Fact Sheet for Patients:  PinkCheek.be Fact Sheet for Healthcare Providers:  GravelBags.it This test is not yet ap proved or cleared by the [place] FDA and  has been authorized for detection and/or diagnosis of SARS-CoV-2 by FDA under an Emergency Use Authorization (EUA). This EUA will remain  in effect (meaning this test can be used) for the duration of the COVID-19 declaration under Section 564(b)(1) of the Act, 21 U.S.C. section 360bbb-3(b)(1), unless the authorization is terminated  or revoked sooner.    Influenza A by PCR NEGATIVE NEGATIVE Final   Influenza B by PCR NEGATIVE NEGATIVE Final    Comment: (NOTE) The Xpert Xpress SARS-CoV-2/FLU/RSV assay is intended as an aid in  the diagnosis of influenza from Nasopharyngeal swab specimens and  should not be used as a sole basis for treatment. Nasal washings and  aspirates are unacceptable for Xpert Xpress SARS-CoV-2/FLU/RSV  testing. Fact Sheet for Patients: PinkCheek.be Fact Sheet for Healthcare Providers: GravelBags.it This test is not yet approved or cleared by the [place] FDA and  has been authorized for detection and/or diagnosis of SARS-CoV-2 by  FDA under an Emergency Use Authorization (EUA). This EUA will remain  in effect (meaning this test can be used) for the duration of the  Covid-19 declaration under Section 564(b)(1) of the Act, 21  U.S.C. section 360bbb-3(b)(1), unless the authorization is  terminated or revoked. Performed at [facility], [address], [place], [place] [address]          Radiology Studies: No results found.      Scheduled Meds: . atorvastatin  10 mg Oral Daily  . canagliflozin  100 mg Oral QAC breakfast  . darifenacin  7.5 mg Oral Daily  . enoxaparin (LOVENOX) injection  40 mg Subcutaneous Q24H  . ferrous sulfate  325 mg Oral Daily  . fluticasone  2 spray Each Nare Daily  . fluticasone furoate-vilanterol  1 puff Inhalation Daily   And  . umeclidinium bromide  1 puff Inhalation Daily  . furosemide  40 mg Intravenous Q6H  . insulin aspart  0-15 Units Subcutaneous TID WC  . insulin aspart  0-5 Units Subcutaneous QHS  . insulin aspart  4 Units Subcutaneous TID WC  . insulin glargine  40 Units Subcutaneous Daily  . montelukast  10 mg Oral QHS  . pantoprazole  40 mg Oral Daily  . vitamin B-12  500 mcg Oral Daily   Continuous Infusions:  Assessment & Plan:   Principal Problem:   Acute  on chronic diastolic CHF (congestive heart failure) (HCC) Active Problems:   COPD (chronic obstructive pulmonary disease) (HCC)   Hypertension   Iron deficiency anemia   Obesity (BMI 35.0-39.9 without comorbidity)   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 diabetes mellitus (HCC)   Symptomatic bradycardia   Junctional bradycardia   AKI (acute kidney injury) (HCC)   Chronic respiratory failure with hypoxia (HCC)   Bradycardia   Junctional bradycardia Patient presented with 3 days progressive CHF symptoms and found to be in junctional bradycardia again.-self induced by bisoprolol by accident. Was discontinued as outpatient. Today junctional. Cardiology following.. Needs zio patch.  Avoid beta blk and ca channel blk. Consider REDS VEST tomorrow before discharge per cards -Hold all beta blockade -Monitor on telemetry   Acute on chronic diastolic CHF Hypertension EF 65 to 70% on echocardiogram earlier this month.  Her bradycardia led to low cardiac output heart failure. Was diuresed, mildly vol. Overloaded today, will give iv lasix today -Continue K supplement -Strict I/Os, daily weights, telemetry  -Daily monitoring renal function -Hold losartan, HCTZ for now  Acute kidney injury on CKDIIIa Congestive nephropathy. Improving to 1.2 today, (Baseline is 0.9) -Hold losartan, hydrochlorothiazide -Resume oral diuretics -Close monitoringcreatinine and potassium while diuresing  COPD with chronic hypoxic respiratory failure No wheezing or evidence of bronchospasm on exam. -Continue home ICS LABA LAMA -Continue Singulair, PPI, Flonase On home 3 L oxygen  Hypoglycemia Resolved  Insulin-dependent diabetes On Vgo at home. -Hold metformin -Restart canagliflozin -Continue corrections -Continue Lantus, increase dose -Continue atorvastatin  AKI -likely due to cardiorenal syndrome.   Improved with aggressive diuresis.    Anemia Normocytic, listed in  the chart is iron deficiency.  Hgb slightly down today, no clinical bleeding. -Continue iron, B12  Bladder incontinence -Continue Vesicare  Leukocytosis This is chronic, unclear cause. Doubt infection.     DVT prophylaxis: lovenox Code Status: Full Family Communication: None at bedside Disposition Plan: Possible DC in a.m.       LOS: 2 days   Time spent: 45 minutes with more than 50% COC    Nolberto Hanlon, MD [facility] Pager [phone]  If 7PM-7AM, please contact night-coverage [email or web address] Password [facility] 02/26/2019, 4:25 PM

## 2019-02-26 NOTE — Progress Notes (Signed)
Inpatient Diabetes Program Recommendations  AACE/ADA: New Consensus Statement on Inpatient Glycemic Control (2015)  Target Ranges:  Prepandial:   less than 140 mg/dL      Peak postprandial:   less than 180 mg/dL (1-2 hours)      Critically ill patients:  140 - 180 mg/dL   Lab Results  Component Value Date   GLUCAP 298 (H) 02/26/2019   HGBA1C 8.4 (H) 02/24/2019   Review of Glycemic Control  Admit with:Acute on Chronic HFpEF/Junctional Bradycardia  History:DM, COPD, CHF, CKD  Home DM Meds:[id number] Pumpwith Humalog Invokana 100 mg Daily Metformin 1000 mg BID  Current Orders: Lantus 40 units daily,Novolog 0-15 units TID with meals, Novolog 0-5 units at bedtime, Novolog 4 units tid with meals  Inpatient Diabetes Program Recommendations:    Consider increasing Novolog meal coverage to 6 units tid with meals.   Thanks  Adah Perl, RN, BC-ADM Inpatient Diabetes Coordinator Pager [phone] (8a-5p)

## 2019-02-26 NOTE — Progress Notes (Addendum)
Progress Note  Patient Name: Karen [name] Date of Encounter: 02/26/2019  Primary Cardiologist: Rockey Situ  Subjective   No complaints this morning. Improving junctional rhythm burden. Currently in NSR with heart rates in the 70s bpm. Documented UOP of 1.1 L for the past 24 hours with a net - 5.9 L for the admission. Weight 103-->99.7-->101.6 kg. Renal function stable.  Inpatient Medications    Scheduled Meds: . atorvastatin  10 mg Oral Daily  . canagliflozin  100 mg Oral QAC breakfast  . darifenacin  7.5 mg Oral Daily  . enoxaparin (LOVENOX) injection  40 mg Subcutaneous Q24H  . ferrous sulfate  325 mg Oral Daily  . fluticasone  2 spray Each Nare Daily  . fluticasone furoate-vilanterol  1 puff Inhalation Daily   And  . umeclidinium bromide  1 puff Inhalation Daily  . furosemide  40 mg Oral BID  . insulin aspart  0-15 Units Subcutaneous TID WC  . insulin aspart  0-5 Units Subcutaneous QHS  . insulin aspart  4 Units Subcutaneous TID WC  . insulin glargine  40 Units Subcutaneous Daily  . montelukast  10 mg Oral QHS  . pantoprazole  40 mg Oral Daily  . vitamin B-12  500 mcg Oral Daily   Continuous Infusions:  PRN Meds: acetaminophen **OR** acetaminophen, albuterol, ondansetron **OR** ondansetron (ZOFRAN) IV   Vital Signs    Vitals:   02/25/19 0740 02/25/19 1652 02/25/19 1932 02/26/19 0318  BP: 126/64 (!) 116/54 (!) 121/44 (!) 154/56  Pulse: 63 (!) 41 (!) 48 70  Resp: 14  18 18   Temp: (!) 97.5 F (36.4 C) 97.7 F (36.5 C) (!) 97.4 F (36.3 C) 98.2 F (36.8 C)  TempSrc: Oral Oral Oral Oral  SpO2: 98% 99% 99% 98%  Weight:    101.6 kg  Height:        Intake/Output Summary (Last 24 hours) at 02/26/2019 0716 Last data filed at 02/26/2019 [id number] Gross per 24 hour  Intake 580 ml  Output 1700 ml  Net -1120 ml   Filed Weights   02/24/19 0429 02/25/19 0342 02/26/19 0318  Weight: 103 kg 99.7 kg 101.6 kg    Telemetry    SR 70s to 80s bpm, currently,  intermittent episodes of junction rhythm over the past 24 hours, though with less burden - Personally Reviewed  ECG    No new tracings - Personally Reviewed  Physical Exam   GEN: No acute distress.   Neck: No JVD. Cardiac: RRR, no murmurs, rubs, or gallops.  Respiratory: Clear to auscultation bilaterally.  GI: Soft, nontender, non-distended.   MS: No edema; No deformity. Neuro:  Alert and oriented x 3; Nonfocal.  Psych: Normal affect.  Labs    Chemistry Recent Labs  Lab 02/24/19 0622 02/25/19 0444 02/26/19 0355  NA 138 140 137  K 4.3 4.5 4.2  CL 100 99 96*  CO2 27 29 33*  GLUCOSE 179* 248* 195*  BUN 104* 81* 61*  CREATININE 1.79* 1.21* 1.24*  CALCIUM 8.7* 9.3 9.0  GFRNONAA 27* 44* 42*  GFRAA 32* 51* 49*  ANIONGAP 11 12 8      Hematology Recent Labs  Lab 02/23/19 1512 02/24/19 0622  WBC 13.5* 10.9*  RBC 3.71* 3.37*  HGB 10.6* 9.8*  HCT 33.8* 29.1*  MCV 91.1 86.4  MCH 28.6 29.1  MCHC 31.4 33.7  RDW 15.9* 15.7*  PLT 285 253    Cardiac EnzymesNo results for input(s): TROPONINI in the last 168 hours. No  results for input(s): TROPIPOC in the last 168 hours.   BNP Recent Labs  Lab 02/23/19 1512  BNP 1,897.0*     DDimer No results for input(s): DDIMER in the last 168 hours.   Radiology    No results found.  Cardiac Studies   2D Echo 02/14/2019: 1. Left ventricular ejection fraction, by visual estimation, is 65 to 70%. The left ventricle has normal function. Left ventricular septal wall thickness was normal. Normal left ventricular posterior wall thickness. There is no left ventricular  hypertrophy. 2. The left ventricle has no regional wall motion abnormalities. 3. Global right ventricle has normal systolic function.The right ventricular size is normal. No increase in right ventricular wall thickness. 4. Left atrial size was normal. 5. Right atrial size was normal. 6. The mitral valve is grossly normal. Trivial mitral valve regurgitation. 7.  The tricuspid valve is grossly normal. Tricuspid valve regurgitation is trivial. 8. The aortic valve was not well visualized. Aortic valve regurgitation is not visualized. 9. The pulmonic valve was not well visualized. Pulmonic valve regurgitation is trivial. 10. The aortic root was not well visualized. 11. Moderately elevated pulmonary artery systolic pressure. 12. The atrial septum is grossly normal.  Patient Profile     73 y.o. female with history of HFpEF, junctional bradycardia, CKD stage III, GI bleed, DM2, HTN, HLD, chronic hypoxic respiratory failure secondary to COPD on home oxygen, anemia, and obesitywho is being seen today for the evaluation of symptomatic bradycardia.  Assessment & Plan    1. Symptomatic bradycardia/SSS/junctional bradycardia: -She initially presented with junctional rhythm in 08/2018 which was felt to be exacerbated by bisoprolol in 08/2018 leading to the holding of this medication. She was readmitted in on 02/13/2019 with repeat junctional rhythm after inadvertently picking up a noncancelled prescription of bisoprolol and resuming this medication. Again, with the holding of beta blocker, her junctional rhythm improved. She is now admitted for a second time this month with junctional rhythm in the setting of inadvertent resumption of bisoprolol  -Overall, her junctional rhythm burden has improved significantly with the wash out of bisoprolol, which has been slow in the setting of AKI -Will discuss with MD regarding possible real time Zio patch and if she continues to have symptomatic bradycardia she may need a PPM -Feels significantly better this morning -Continue to hold all AV nodal blocking medications -Hemodynamically stable, no indication for dopamine, transcutaneous pacing or venous temp wire at this time -TSH normal -Potassium at goal  2. Acute on chronic HFpEF: -Diuresed 5.9 L for the admission to date -Diurese as needed -Daily weights -Strict  I/O  3. Cardiorenal syndrome/acute on CKD stage III: -Renal function improving during admission with improvement in junctional rhythm -Likely exacerbated by junctional rhythm -Hold lisinopril/HCTZ -Monitor with diuresis    For questions or updates, please contact [facility] Please consult [email or web address] for contact info under Cardiology/STEMI.    Signed, Christell Faith, PA-C [facility] Pager: [phone] 02/26/2019, 7:16 AM

## 2019-02-27 ENCOUNTER — Ambulatory Visit (INDEPENDENT_AMBULATORY_CARE_PROVIDER_SITE_OTHER): Payer: Medicare Other

## 2019-02-27 DIAGNOSIS — J432 Centrilobular emphysema: Secondary | ICD-10-CM

## 2019-02-27 DIAGNOSIS — J811 Chronic pulmonary edema: Secondary | ICD-10-CM

## 2019-02-27 DIAGNOSIS — J810 Acute pulmonary edema: Secondary | ICD-10-CM

## 2019-02-27 DIAGNOSIS — R0602 Shortness of breath: Secondary | ICD-10-CM

## 2019-02-27 DIAGNOSIS — R001 Bradycardia, unspecified: Secondary | ICD-10-CM

## 2019-02-27 LAB — BASIC METABOLIC PANEL
Anion gap: 12 (ref 5–15)
BUN: 41 mg/dL — ABNORMAL HIGH (ref 8–23)
CO2: 33 mmol/L — ABNORMAL HIGH (ref 22–32)
Calcium: 9 mg/dL (ref 8.9–10.3)
Chloride: 93 mmol/L — ABNORMAL LOW (ref 98–111)
Creatinine, Ser: 0.98 mg/dL (ref 0.44–1.00)
GFR calc Af Amer: >60 mL/min (ref >60)
GFR calc non Af Amer: 56 mL/min — ABNORMAL LOW (ref >60)
Glucose, Bld: 205 mg/dL — ABNORMAL HIGH (ref 70–99)
Potassium: 4.2 mmol/L (ref 3.5–5.1)
Sodium: 138 mmol/L (ref 135–145)

## 2019-02-27 LAB — GLUCOSE, CAPILLARY
Glucose-Capillary: 195 mg/dL — ABNORMAL HIGH (ref 70–99)
Glucose-Capillary: 320 mg/dL — ABNORMAL HIGH (ref 70–99)

## 2019-02-27 MED ORDER — FUROSEMIDE 10 MG/ML IJ SOLN
40.0000 mg | Freq: Once | INTRAMUSCULAR | Status: AC
  Administered 2019-02-27: 40 mg via INTRAVENOUS
  Filled 2019-02-27: qty 4

## 2019-02-27 MED ORDER — POTASSIUM CHLORIDE CRYS ER 10 MEQ PO TBCR: 10.0000 meq | EXTENDED_RELEASE_TABLET | Freq: Every day | ORAL | Status: DC

## 2019-02-27 MED ORDER — FUROSEMIDE 20 MG PO TABS: 20.0000 mg | ORAL_TABLET | Freq: Every day | ORAL | 0 refills | Status: DC

## 2019-02-27 MED ORDER — POTASSIUM CHLORIDE CRYS ER 10 MEQ PO TBCR: 10.0000 meq | EXTENDED_RELEASE_TABLET | Freq: Every day | ORAL | 0 refills | Status: DC

## 2019-02-27 MED ORDER — FUROSEMIDE 20 MG PO TABS: 20.0000 mg | ORAL_TABLET | Freq: Every day | ORAL | Status: DC

## 2019-02-27 NOTE — Discharge Summary (Signed)
Karen [name] [id number] DOB: Sep 24, 1945 DOA: 02/23/2019  PCP: Ricardo Jericho, NP  Admit date: 02/23/2019 Discharge date: 02/27/2019  Admitted From: [facility] Disposition:  HOME  Recommendations for Outpatient Follow-up:  1. Follow up with PCP in 1 week 2. Please obtain BMP/CBC in one week 3. Please follow up on the following pending results:NONE 4. F/U CHF clinic 5. Follow-up with Dr. Rockey Situ next week  Home Health: None   Discharge Condition:Stable CODE STATUS: Full Diet recommendation: Heart Healthy, low-sodium  Brief/Interim Summary: Karen [name] is a69 y.o.F with COPD on 3L, dCHF, MO, DM on insulin pump, CKD IIIa baseline Cr 1.0 and recent junctional bradycardia due to beta-blockadewho presents with bradycardia and dyspnea.  Patient recently admitted with bradycardia, congestive heart failure, AKI due to inadvertent bisoprolol use again. Cardiology were consulted, beta-blockade was held, she was diuresed 9 L, and her symptoms and kidney failure resolved.  Since then, she was home about 7 days before dyspnea, orthopnea started again. She noticed that her heart rate was in the 30s again, felt extremely out of breath, so she returned to the ER.  In the ER, she had heart rate 38,BP129/105. Creatinine 2.5 (up from 0.9 at discharge 10 days ago). She had elevated anion gap, normal potassium. Chest x-ray showed mild congestion and cardiomegaly. ECG showed junctional rhythm, rate in the 30s. Troponin was negative.  Cardiology was consulted.  Patient was aggressively diuresed and overnight she had -5 L.  Her AKI improved with diuresis.  She had a small bout of junctional rhythm has remained asymptomatic.  She was consulted several times that she needs to hold her beta-blocker and never take it again.  Which she verbalizes an understanding.  She had an underlying cardiorenal syndrome likely from marked bradycardia.  She received 1 dose of IV Lasix today with p.o. to  start tomorrow. Cardiology placed her on ZIO monitor prior to discharge.  She is more euvolemic and her acute renal failure/cardiorenal syndrome improved with aggressive diuresis and she is stable to be discharged home with follow-up with CHF clinic and Dr. Rockey Situ.  Discharge Diagnoses:  Principal Problem:   Junctional bradycardia Active Problems:   COPD (chronic obstructive pulmonary disease) (HCC)   Hypertension   Iron deficiency anemia   Obesity (BMI 35.0-39.9 without comorbidity)   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 diabetes mellitus (HCC)   Symptomatic bradycardia   AKI (acute kidney injury) ([place])   Chronic respiratory failure with hypoxia (HCC)   Bradycardia   Pulmonary edema    Discharge Instructions  Discharge Instructions    Call MD for:  difficulty breathing, headache or visual disturbances   Complete by: As directed    Diet - low sodium heart healthy   Complete by: As directed    Discharge instructions   Complete by: As directed    Follow up with Dr. Rockey Situ in one week F/u with chf clinic F/u with pcp in 3 days , get bmp   Increase activity slowly   Complete by: As directed      Allergies as of 02/27/2019      Reactions   Ace Inhibitors Hives   Beta Adrenergic Blockers    Junctional bradycardia   Gabapentin Hives   Lisinopril Hives   Lyrica [pregabalin] Hives   Shrimp [shellfish Allergy] Swelling   Swelling of the lips      Medication List    TAKE these medications   albuterol 108 (90 Base) MCG/ACT inhaler Commonly known as: VENTOLIN HFA INHALE  2 PUFFS BY MOUTH EVERY 6 HOURS AS NEEDED FOR WHEEZING   atorvastatin 10 MG tablet Commonly known as: LIPITOR Take 1 tablet by mouth daily.   esomeprazole 40 MG capsule Commonly known as: NEXIUM Take 40 mg by mouth daily.   fluticasone 50 MCG/ACT nasal spray Commonly known as: FLONASE Place 2 sprays into both nostrils daily.   Fluticasone-Umeclidin-Vilant 100-62.5-25 MCG/INH  Aepb Inhale 1 puff into the lungs daily.   furosemide 20 MG tablet Commonly known as: LASIX Take 1 tablet (20 mg total) by mouth daily. Start taking on: February 28, 2019 What changed:   medication strength  how much to take  when to take this   insulin pump Soln Inject 1 each into the skin 3 times daily with meals, bedtime and 2 AM. What changed: additional instructions   Invokana 100 MG Tabs tablet Generic drug: canagliflozin Take 1 tablet by mouth daily.   ipratropium-albuterol 0.5-2.5 (3) MG/3ML Soln Commonly known as: DUONEB Inhale 3 mLs into the lungs 4 (four) times daily.   Iron 325 (65 Fe) MG Tabs Take 1 tablet by mouth daily.   losartan-hydrochlorothiazide 100-25 MG tablet Commonly known as: HYZAAR Take 1 tablet by mouth daily.   metFORMIN 500 MG tablet Commonly known as: GLUCOPHAGE Take 1,000 mg by mouth 2 (two) times daily with a meal.   montelukast 10 MG tablet Commonly known as: SINGULAIR Take 10 mg by mouth at bedtime.   potassium chloride 10 MEQ tablet Commonly known as: KLOR-CON Take 1 tablet (10 mEq total) by mouth daily. Start taking on: February 28, 2019   solifenacin 10 MG tablet Commonly known as: VESICARE Take 1 tablet (10 mg total) by mouth daily.   vitamin B-12 500 MCG tablet Commonly known as: CYANOCOBALAMIN Take 500 mcg by mouth daily.      Follow-up Information    [facility] Follow up on 03/06/2019.   Specialty: Cardiology Why: at 11:30am. Enter through the [facility] entrance Contact information: [address] [place] [phone]       [name], MD. Go on 03/05/2019.   Specialty: Cardiology Why: APPOINTMENT AT 2:30PM Contact information: [place] [address] [phone]        Ricardo Jericho, NP Follow up in 4 day(s).   Specialty: Family Medicine Why: need bmp Contact  information: [place] [address] [phone]          Allergies  Allergen Reactions  . Ace Inhibitors Hives  . Beta Adrenergic Blockers     Junctional bradycardia  . Gabapentin Hives  . Lisinopril Hives  . Lyrica [Pregabalin] Hives  . Shrimp [Shellfish Allergy] Swelling    Swelling of the lips    Consultations:  Cardiology   Procedures/Studies: DG Chest 2 View  Result Date: 02/13/2019 CLINICAL DATA:  Shortness of breath, history of CHF and COPD EXAM: CHEST - 2 VIEW COMPARISON:  Radiograph 08/05/2018, CT 06/23/2016 FINDINGS: Chronic lung hyperinflation with flattening of the diaphragms. There are basilar predominant hazy interstitial opacities with fissural and septal thickening. Suspect trace bilateral effusions. Mild cardiomegaly is similar to comparison portable radiograph. The aorta is calcified. No pneumothorax. No acute osseous or soft tissue abnormality. Degenerative changes are present in the imaged spine and shoulders. IMPRESSION: Features suggestive of CHF with interstitial edema, cardiomegaly and probable trace bilateral effusions. Aortic Atherosclerosis (ICD10-I70.0). Electronically Signed   By: Elwin Sleight.D.  On: 02/13/2019 16:42   DG Chest Port 1 View  Result Date: 02/23/2019 CLINICAL DATA:  Shortness of breath. EXAM: PORTABLE CHEST 1 VIEW COMPARISON:  February 13, 2019 FINDINGS: Stable cardiomegaly. The hila and mediastinum are normal. Transcutaneous pacer leads overlie the lower left chest obscuring this region. Mild edema suspected. No focal infiltrate. IMPRESSION: Cardiomegaly and mild pulmonary venous congestion/mild edema. No other acute abnormalities identified. Electronically Signed   By: Dorise Bullion III M.D   On: 02/23/2019 15:46   ECHOCARDIOGRAM COMPLETE  Result Date: 02/14/2019   ECHOCARDIOGRAM REPORT   Patient Name:   AYDIA KUBLY Date of Exam: 02/14/2019 Medical Rec #:  [id number]        Height:       63.0 in Accession  #:    [id number]       Weight:       220.0 lb Date of Birth:  04/26/1945       BSA:          2.01 m Patient Age:    31 years         BP:           167/75 mmHg Patient Gender: F                HR:           63 bpm. Exam Location:  [facility] Procedure: 2D Echo, Color Doppler and Cardiac Doppler Indications:     I48.91 Atrial fibrillation  History:         Patient has no prior history of Echocardiogram examinations.                  Risk Factors:Hypertension.  Sonographer:     Charmayne Sheer RDCS (AE) Referring Phys:  [id number] Nicolette Bang Diagnosing Phys: Bartholome Bill MD  Sonographer Comments: Suboptimal apical window. IMPRESSIONS  1. Left ventricular ejection fraction, by visual estimation, is 65 to 70%. The left ventricle has normal function. Left ventricular septal wall thickness was normal. Normal left ventricular posterior wall thickness. There is no left ventricular hypertrophy.  2. The left ventricle has no regional wall motion abnormalities.  3. Global right ventricle has normal systolic function.The right ventricular size is normal. No increase in right ventricular wall thickness.  4. Left atrial size was normal.  5. Right atrial size was normal.  6. The mitral valve is grossly normal. Trivial mitral valve regurgitation.  7. The tricuspid valve is grossly normal. Tricuspid valve regurgitation is trivial.  8. The aortic valve was not well visualized. Aortic valve regurgitation is not visualized.  9. The pulmonic valve was not well visualized. Pulmonic valve regurgitation is trivial. 10. The aortic root was not well visualized. 11. Moderately elevated pulmonary artery systolic pressure. 12. The atrial septum is grossly normal. FINDINGS  Left Ventricle: Left ventricular ejection fraction, by visual estimation, is 65 to 70%. The left ventricle has normal function. The left ventricle has no regional wall motion abnormalities. Normal left ventricular posterior wall thickness. There is no left ventricular  hypertrophy. Right Ventricle: The right ventricular size is normal. No increase in right ventricular wall thickness. Global RV systolic function is has normal systolic function. The tricuspid regurgitant velocity is 3.41 m/s, and with an assumed right atrial pressure  of 10 mmHg, the estimated right ventricular systolic pressure is moderately elevated at 56.5 mmHg. Left Atrium: Left atrial size was normal in size. Right Atrium: Right atrial size was normal in size Pericardium: There is no evidence  of pericardial effusion. Mitral Valve: The mitral valve is grossly normal. Trivial mitral valve regurgitation. MV peak gradient, 8.4 mmHg. Tricuspid Valve: The tricuspid valve is grossly normal. Tricuspid valve regurgitation is trivial. Aortic Valve: The aortic valve was not well visualized. Aortic valve regurgitation is not visualized. Aortic valve mean gradient measures 13.0 mmHg. Aortic valve peak gradient measures 20.6 mmHg. Aortic valve area, by VTI measures 1.29 cm. Pulmonic Valve: The pulmonic valve was not well visualized. Pulmonic valve regurgitation is trivial. Pulmonic regurgitation is trivial. Aorta: The aortic root was not well visualized. IAS/Shunts: The atrial septum is grossly normal.  LEFT VENTRICLE PLAX 2D LVIDd:         4.70 cm  Diastology LVIDs:         2.78 cm  LV e' lateral:   7.51 cm/s LV PW:         0.83 cm  LV E/e' lateral: 12.6 LV IVS:        0.82 cm  LV e' medial:    5.33 cm/s LVOT diam:     1.70 cm  LV E/e' medial:  17.7 LV SV:         73 ml LV SV Index:   34.00 LVOT Area:     2.27 cm  RIGHT VENTRICLE RV Basal diam:  3.51 cm LEFT ATRIUM             Index       RIGHT ATRIUM           Index LA diam:        4.50 cm 2.23 cm/m  RA Area:     15.80 cm LA Vol (A2C):   58.6 ml 29.10 ml/m RA Volume:   38.20 ml  18.97 ml/m LA Vol (A4C):   73.7 ml 36.60 ml/m LA Biplane Vol: 67.6 ml 33.57 ml/m  AORTIC VALVE                    PULMONIC VALVE AV Area (Vmax):    1.14 cm     PV Vmax:       1.42 m/s AV  Area (Vmean):   1.08 cm     PV Vmean:      94.700 cm/s AV Area (VTI):     1.29 cm     PV VTI:        0.333 m AV Vmax:           227.00 cm/s  PV Peak grad:  8.1 mmHg AV Vmean:          172.000 cm/s PV Mean grad:  4.0 mmHg AV VTI:            0.540 m AV Peak Grad:      20.6 mmHg AV Mean Grad:      13.0 mmHg LVOT Vmax:         114.00 cm/s LVOT Vmean:        81.500 cm/s LVOT VTI:          0.306 m LVOT/AV VTI ratio: 0.57  AORTA Ao Root diam: 2.80 cm MITRAL VALVE                         TRICUSPID VALVE MV Area (PHT): 3.48 cm              TR Peak grad:   46.5 mmHg MV Peak grad:  8.4 mmHg  TR Vmax:        341.00 cm/s MV Mean grad:  2.0 mmHg MV Vmax:       1.45 m/s              SHUNTS MV Vmean:      68.8 cm/s             Systemic VTI:  0.31 m MV VTI:        0.39 m                Systemic Diam: 1.70 cm MV PHT:        63.22 msec MV Decel Time: 218 msec MV E velocity: 94.60 cm/s  103 cm/s MV A velocity: 121.00 cm/s 70.3 cm/s MV E/A ratio:  0.78        1.5  Bartholome Bill MD Electronically signed by Bartholome Bill MD Signature Date/Time: 02/14/2019/2:42:14 PM    Final        Subjective:   Discharge Exam: Vitals:   02/27/19 0553 02/27/19 0735  BP: (!) 144/60 (!) 146/53  Pulse: 86 81  Resp: 20 19  Temp: 97.7 F (36.5 C) 98.2 F (36.8 C)  SpO2: 95% 95%   Vitals:   02/26/19 1529 02/26/19 1955 02/27/19 0553 02/27/19 0735  BP: (!) 151/56 (!) 149/65 (!) 144/60 (!) 146/53  Pulse: 68 86 86 81  Resp: (!) 24 (!) 21 20 19   Temp: 99 F (37.2 C) 98.3 F (36.8 C) 97.7 F (36.5 C) 98.2 F (36.8 C)  TempSrc: Oral Oral Oral Oral  SpO2: 97% 97% 95% 95%  Weight:   98.3 kg   Height:        General: Pt is alert, awake, not in acute distress Cardiovascular: RRR, S1/S2 +, no rubs, no gallops Respiratory: CTA bilaterally, no wheezing, no rhonchi Abdominal: Soft, NT, ND, bowel sounds + Extremities: trace edema, no cyanosis    The results of significant diagnostics from this hospitalization (including  imaging, microbiology, ancillary and laboratory) are listed below for reference.     Microbiology: Recent Results (from the past 240 hour(s))  Respiratory Panel by RT PCR (Flu A&B, Covid) - Nasopharyngeal Swab     Status: None   Collection Time: 02/23/19  4:05 PM   Specimen: Nasopharyngeal Swab  Result Value Ref Range Status   SARS Coronavirus 2 by RT PCR NEGATIVE NEGATIVE Final    Comment: (NOTE) SARS-CoV-2 target nucleic acids are NOT DETECTED. The SARS-CoV-2 RNA is generally detectable in upper respiratoy specimens during the acute phase of infection. The lowest concentration of SARS-CoV-2 viral copies this assay can detect is 131 copies/mL. A negative result does not preclude SARS-Cov-2 infection and should not be used as the sole basis for treatment or other patient management decisions. A negative result may occur with  improper specimen collection/handling, submission of specimen other than nasopharyngeal swab, presence of viral mutation(s) within the areas targeted by this assay, and inadequate number of viral copies (<131 copies/mL). A negative result must be combined with clinical observations, patient history, and epidemiological information. The expected result is Negative. Fact Sheet for Patients:  PinkCheek.be Fact Sheet for Healthcare Providers:  GravelBags.it This test is not yet ap proved or cleared by the [place] FDA and  has been authorized for detection and/or diagnosis of SARS-CoV-2 by FDA under an Emergency Use Authorization (EUA). This EUA will remain  in effect (meaning this test can be used) for the duration of the COVID-19 declaration under Section 564(b)(1) of the  Act, 21 U.S.C. section 360bbb-3(b)(1), unless the authorization is terminated or revoked sooner.    Influenza A by PCR NEGATIVE NEGATIVE Final   Influenza B by PCR NEGATIVE NEGATIVE Final    Comment: (NOTE) The Xpert Xpress  SARS-CoV-2/FLU/RSV assay is intended as an aid in  the diagnosis of influenza from Nasopharyngeal swab specimens and  should not be used as a sole basis for treatment. Nasal washings and  aspirates are unacceptable for Xpert Xpress SARS-CoV-2/FLU/RSV  testing. Fact Sheet for Patients: PinkCheek.be Fact Sheet for Healthcare Providers: GravelBags.it This test is not yet approved or cleared by the [place] FDA and  has been authorized for detection and/or diagnosis of SARS-CoV-2 by  FDA under an Emergency Use Authorization (EUA). This EUA will remain  in effect (meaning this test can be used) for the duration of the  Covid-19 declaration under Section 564(b)(1) of the Act, 21  U.S.C. section 360bbb-3(b)(1), unless the authorization is  terminated or revoked. Performed at [facility], [address] [place], [place] [address]      Labs: BNP (last 3 results) Recent Labs    08/05/18 1624 02/13/19 1619 02/23/19 1512  BNP 831.0* 622.0* 123456*   Basic Metabolic Panel: Recent Labs  Lab 02/23/19 1512 02/24/19 0622 02/25/19 0444 02/26/19 0355 02/27/19 0430  NA 138 138 140 137 138  K 4.6 4.3 4.5 4.2 4.2  CL 103 100 99 96* 93*  CO2 18* 27 29 33* 33*  GLUCOSE 58* 179* 248* 195* 205*  BUN 115* 104* 81* 61* 41*  CREATININE 2.57* 1.79* 1.21* 1.24* 0.98  CALCIUM 8.9 8.7* 9.3 9.0 9.0   Liver Function Tests: No results for input(s): AST, ALT, ALKPHOS, BILITOT, PROT, ALBUMIN in the last 168 hours. No results for input(s): LIPASE, AMYLASE in the last 168 hours. No results for input(s): AMMONIA in the last 168 hours. CBC: Recent Labs  Lab 02/23/19 1512 02/24/19 0622  WBC 13.5* 10.9*  HGB 10.6* 9.8*  HCT 33.8* 29.1*  MCV 91.1 86.4  PLT 285 253   Cardiac Enzymes: No results for input(s): CKTOTAL, CKMB, CKMBINDEX, TROPONINI in the last 168 hours. BNP: Invalid input(s): POCBNP CBG: Recent Labs  Lab  02/26/19 1128 02/26/19 1639 02/26/19 2134 02/27/19 0736 02/27/19 1156  GLUCAP 298* 280* 223* 195* 320*   D-Dimer No results for input(s): DDIMER in the last 72 hours. Hgb A1c No results for input(s): HGBA1C in the last 72 hours. Lipid Profile No results for input(s): CHOL, HDL, LDLCALC, TRIG, CHOLHDL, LDLDIRECT in the last 72 hours. Thyroid function studies No results for input(s): TSH, T4TOTAL, T3FREE, THYROIDAB in the last 72 hours.  Invalid input(s): FREET3 Anemia work up No results for input(s): VITAMINB12, FOLATE, FERRITIN, TIBC, IRON, RETICCTPCT in the last 72 hours. Urinalysis    Component Value Date/Time   COLORURINE YELLOW (A) 02/13/2019 1855   APPEARANCEUR CLOUDY (A) 02/13/2019 1855   APPEARANCEUR Cloudy (A) 09/28/2017 1338   LABSPEC 1.017 02/13/2019 1855   LABSPEC 1.024 04/02/2014 0517   PHURINE 5.0 02/13/2019 1855   GLUCOSEU NEGATIVE 02/13/2019 1855   GLUCOSEU >=500 04/02/2014 0517   HGBUR NEGATIVE 02/13/2019 1855   BILIRUBINUR NEGATIVE 02/13/2019 1855   BILIRUBINUR Negative 09/28/2017 1338   BILIRUBINUR Negative 04/02/2014 [place] 02/13/2019 1855   PROTEINUR NEGATIVE 02/13/2019 1855   NITRITE NEGATIVE 02/13/2019 1855   LEUKOCYTESUR LARGE (A) 02/13/2019 1855   LEUKOCYTESUR Trace 04/02/2014 0517   Sepsis Labs Invalid input(s): PROCALCITONIN,  WBC,  LACTICIDVEN Microbiology Recent  Results (from the past 240 hour(s))  Respiratory Panel by RT PCR (Flu A&B, Covid) - Nasopharyngeal Swab     Status: None   Collection Time: 02/23/19  4:05 PM   Specimen: Nasopharyngeal Swab  Result Value Ref Range Status   SARS Coronavirus 2 by RT PCR NEGATIVE NEGATIVE Final    Comment: (NOTE) SARS-CoV-2 target nucleic acids are NOT DETECTED. The SARS-CoV-2 RNA is generally detectable in upper respiratoy specimens during the acute phase of infection. The lowest concentration of SARS-CoV-2 viral copies this assay can detect is 131 copies/mL. A negative result  does not preclude SARS-Cov-2 infection and should not be used as the sole basis for treatment or other patient management decisions. A negative result may occur with  improper specimen collection/handling, submission of specimen other than nasopharyngeal swab, presence of viral mutation(s) within the areas targeted by this assay, and inadequate number of viral copies (<131 copies/mL). A negative result must be combined with clinical observations, patient history, and epidemiological information. The expected result is Negative. Fact Sheet for Patients:  PinkCheek.be Fact Sheet for Healthcare Providers:  GravelBags.it This test is not yet ap proved or cleared by the [place] FDA and  has been authorized for detection and/or diagnosis of SARS-CoV-2 by FDA under an Emergency Use Authorization (EUA). This EUA will remain  in effect (meaning this test can be used) for the duration of the COVID-19 declaration under Section 564(b)(1) of the Act, 21 U.S.C. section 360bbb-3(b)(1), unless the authorization is terminated or revoked sooner.    Influenza A by PCR NEGATIVE NEGATIVE Final   Influenza B by PCR NEGATIVE NEGATIVE Final    Comment: (NOTE) The Xpert Xpress SARS-CoV-2/FLU/RSV assay is intended as an aid in  the diagnosis of influenza from Nasopharyngeal swab specimens and  should not be used as a sole basis for treatment. Nasal washings and  aspirates are unacceptable for Xpert Xpress SARS-CoV-2/FLU/RSV  testing. Fact Sheet for Patients: PinkCheek.be Fact Sheet for Healthcare Providers: GravelBags.it This test is not yet approved or cleared by the [place] FDA and  has been authorized for detection and/or diagnosis of SARS-CoV-2 by  FDA under an Emergency Use Authorization (EUA). This EUA will remain  in effect (meaning this test can be used) for the duration of  the  Covid-19 declaration under Section 564(b)(1) of the Act, 21  U.S.C. section 360bbb-3(b)(1), unless the authorization is  terminated or revoked. Performed at [facility], [address], [place], [place] [address]      Time coordinating discharge: Over 30 minutes  SIGNED:   Nolberto Hanlon, MD  [facility] 02/27/2019, 1:55 PM Pager   If 7PM-7AM, please contact night-coverage [email or web address] Password [email or web address]

## 2019-02-27 NOTE — Progress Notes (Signed)
Progress Note  Patient Name: Karen [name] Date of Encounter: 02/27/2019  Primary Cardiologist: Rockey Situ  Subjective   No complaints this morning. Improving junctional rhythm burden. Currently in NSR with heart rates in the 70s bpm. Short episode of junctional rhythm from 17:00-19:00 on 12/23. She was restarted on IV Lasix last evening with good urine output. Documented UOP of 5.2 L for the past 24 hours with a net - 11.2 L for the admission. Weight 101.6-->98.3 kg. Renal function continues to improve.  Inpatient Medications    Scheduled Meds: . atorvastatin  10 mg Oral Daily  . canagliflozin  100 mg Oral QAC breakfast  . darifenacin  7.5 mg Oral Daily  . enoxaparin (LOVENOX) injection  40 mg Subcutaneous Q24H  . ferrous sulfate  325 mg Oral Daily  . fluticasone  2 spray Each Nare Daily  . fluticasone furoate-vilanterol  1 puff Inhalation Daily   And  . umeclidinium bromide  1 puff Inhalation Daily  . insulin aspart  0-15 Units Subcutaneous TID WC  . insulin aspart  0-5 Units Subcutaneous QHS  . insulin aspart  4 Units Subcutaneous TID WC  . insulin glargine  40 Units Subcutaneous Daily  . montelukast  10 mg Oral QHS  . pantoprazole  40 mg Oral Daily  . vitamin B-12  500 mcg Oral Daily   Continuous Infusions:  PRN Meds: acetaminophen **OR** acetaminophen, albuterol, ondansetron **OR** ondansetron (ZOFRAN) IV   Vital Signs    Vitals:   02/26/19 1529 02/26/19 1955 02/27/19 0553 02/27/19 0735  BP: (!) 151/56 (!) 149/65 (!) 144/60 (!) 146/53  Pulse: 68 86 86 81  Resp: (!) 24 (!) 21 20 19   Temp: 99 F (37.2 C) 98.3 F (36.8 C) 97.7 F (36.5 C) 98.2 F (36.8 C)  TempSrc: Oral Oral Oral Oral  SpO2: 97% 97% 95% 95%  Weight:   98.3 kg   Height:        Intake/Output Summary (Last 24 hours) at 02/27/2019 0754 Last data filed at 02/27/2019 0547 Gross per 24 hour  Intake 720 ml  Output 3800 ml  Net -3080 ml   Filed Weights   02/25/19 0342 02/26/19 0318 02/27/19  0553  Weight: 99.7 kg 101.6 kg 98.3 kg    Telemetry    SR 70s to 80s bpm, currently, intermittent episodes of junction rhythm over the past 24 hours, though with less burden - Personally Reviewed  ECG    No new tracings - Personally Reviewed  Physical Exam   GEN: No acute distress.   Neck: No JVD. Cardiac: RRR, no murmurs, rubs, or gallops.  Respiratory: Clear to auscultation bilaterally.  GI: Soft, nontender, non-distended.   MS: No edema; No deformity. Neuro:  Alert and oriented x 3; Nonfocal.  Psych: Normal affect.  Labs    Chemistry Recent Labs  Lab 02/25/19 0444 02/26/19 0355 02/27/19 0430  NA 140 137 138  K 4.5 4.2 4.2  CL 99 96* 93*  CO2 29 33* 33*  GLUCOSE 248* 195* 205*  BUN 81* 61* 41*  CREATININE 1.21* 1.24* 0.98  CALCIUM 9.3 9.0 9.0  GFRNONAA 44* 42* 56*  GFRAA 51* 49* >60  ANIONGAP 12 8 12      Hematology Recent Labs  Lab 02/23/19 1512 02/24/19 0622  WBC 13.5* 10.9*  RBC 3.71* 3.37*  HGB 10.6* 9.8*  HCT 33.8* 29.1*  MCV 91.1 86.4  MCH 28.6 29.1  MCHC 31.4 33.7  RDW 15.9* 15.7*  PLT 285 253  Cardiac EnzymesNo results for input(s): TROPONINI in the last 168 hours. No results for input(s): TROPIPOC in the last 168 hours.   BNP Recent Labs  Lab 02/23/19 1512  BNP 1,897.0*     DDimer No results for input(s): DDIMER in the last 168 hours.   Radiology    No results found.  Cardiac Studies   2D Echo 02/14/2019: 1. Left ventricular ejection fraction, by visual estimation, is 65 to 70%. The left ventricle has normal function. Left ventricular septal wall thickness was normal. Normal left ventricular posterior wall thickness. There is no left ventricular  hypertrophy. 2. The left ventricle has no regional wall motion abnormalities. 3. Global right ventricle has normal systolic function.The right ventricular size is normal. No increase in right ventricular wall thickness. 4. Left atrial size was normal. 5. Right atrial size was  normal. 6. The mitral valve is grossly normal. Trivial mitral valve regurgitation. 7. The tricuspid valve is grossly normal. Tricuspid valve regurgitation is trivial. 8. The aortic valve was not well visualized. Aortic valve regurgitation is not visualized. 9. The pulmonic valve was not well visualized. Pulmonic valve regurgitation is trivial. 10. The aortic root was not well visualized. 11. Moderately elevated pulmonary artery systolic pressure. 12. The atrial septum is grossly normal.  Patient Profile     73 y.o. female with history of HFpEF, junctional bradycardia, CKD stage III, GI bleed, DM2, HTN, HLD, chronic hypoxic respiratory failure secondary to COPD on home oxygen, anemia, and obesitywho is being seen today for the evaluation of symptomatic bradycardia.  Assessment & Plan    1. Symptomatic bradycardia/SSS/junctional bradycardia: -She initially presented with junctional rhythm in 08/2018 which was felt to be exacerbated by bisoprolol in 08/2018 leading to the holding of this medication. She was readmitted in on 02/13/2019 with repeat junctional rhythm after inadvertently picking up a noncancelled prescription of bisoprolol and resuming this medication. Again, with the holding of beta blocker, her junctional rhythm improved. She is now admitted for a second time this month with junctional rhythm in the setting of inadvertent resumption of bisoprolol  -Overall, her junctional rhythm burden has improved significantly with the wash out of bisoprolol, which has been slow in the setting of AKI, though she does continue to have short episodes daily  -Will see if the office can come place a Zio patch on her today prior to discharge and if she continues to have symptomatic bradycardia she may need a PPM -She continues to feel well  -Continue to hold all AV nodal blocking medications -Hemodynamically stable, no indication for dopamine, transcutaneous pacing or venous temp wire at this  time -TSH normal -Potassium at goal  2. Acute on chronic HFpEF: -Diuresed 11.2 L for the admission to date -Will give another one-time dose of IV Lasix 40 mg this morning -Will place her on Lasix 20 mg daily oral to be started 12/25 along with KCl -Daily weights -Strict I/O -Order ReDs vest this morning   3. Cardiorenal syndrome/acute on CKD stage III: -Renal function improving during admission with improvement in junctional rhythm and diuresis  -Likely exacerbated by junctional rhythm -Hold lisinopril/HCTZ -Monitor with diuresis    For questions or updates, please contact [facility] Please consult [email or web address] for contact info under Cardiology/STEMI.    Signed, Christell Faith, PA-C [place] Pager: [phone] 02/27/2019, 7:54 AM

## 2019-02-27 NOTE — Care Management Important Message (Signed)
Important Message  Patient Details  Name: NYKIAH ROTHERMEL MRN: [id number] Date of Birth: 08/20/1945   Medicare Important Message Given:  Yes     Juliann Pulse A Valdis Bevill 02/27/2019, 10:31 AM

## 2019-02-27 NOTE — Progress Notes (Signed)
Discharge instructions explained to pt/ verbalized an understanding / iv and tele removed/ transported off unit via wheelchair.  

## 2019-02-27 NOTE — Plan of Care (Signed)
  Problem: Clinical Measurements: Goal: Ability to maintain clinical measurements within normal limits will improve Outcome: Progressing Goal: Respiratory complications will improve Outcome: Progressing   Problem: Activity: Goal: Risk for activity intolerance will decrease Outcome: Progressing   Problem: Activity: Goal: Capacity to carry out activities will improve Outcome: Progressing   Problem: Cardiac: Goal: Ability to achieve and maintain adequate cardiopulmonary perfusion will improve Outcome: Progressing

## 2019-03-02 ENCOUNTER — Other Ambulatory Visit: Payer: Self-pay | Admitting: Urology

## 2019-03-03 ENCOUNTER — Telehealth: Payer: Self-pay | Admitting: Family

## 2019-03-03 NOTE — Telephone Encounter (Signed)
Left [place] a VM on 12/28 to follow up with her regarding her recent discharge 12/24 and to confirm her follow up appt with [place] that we scheduled due to her recent hospitalization. Left message for her to return our call.   Alyse Low, CNA

## 2019-03-04 ENCOUNTER — Telehealth (HOSPITAL_COMMUNITY): Payer: Self-pay

## 2019-03-04 NOTE — Telephone Encounter (Signed)
Karen [name] contacted me and advised she has been to hospital several times lately.  Advised her I will put her back on the Paramedicine program and she was good with that.  Will set up home visit with her.  She was also asking about Karen [name] meals because they really helped her.  Reached out to [employer] and she will let me know.  Karen [name] states feeling better.  Will remind her of HF appt when speak again to her.  Will follow her for Heart Failure, diet and medication compliance.   Karen [name] [phone]

## 2019-03-05 ENCOUNTER — Other Ambulatory Visit: Payer: Self-pay

## 2019-03-05 ENCOUNTER — Telehealth (HOSPITAL_COMMUNITY): Payer: Self-pay | Admitting: Licensed Clinical Social Worker

## 2019-03-05 ENCOUNTER — Encounter: Payer: Self-pay | Admitting: Family

## 2019-03-05 ENCOUNTER — Ambulatory Visit (INDEPENDENT_AMBULATORY_CARE_PROVIDER_SITE_OTHER): Payer: Medicare Other | Admitting: Family

## 2019-03-05 VITALS — BP 122/62 | HR 77 | Ht 63.0 in | Wt 211.0 lb

## 2019-03-05 DIAGNOSIS — I10 Essential (primary) hypertension: Secondary | ICD-10-CM

## 2019-03-05 DIAGNOSIS — I5032 Chronic diastolic (congestive) heart failure: Secondary | ICD-10-CM | POA: Diagnosis not present

## 2019-03-05 NOTE — Telephone Encounter (Signed)
Patient identified as a candidate to receive 7 heart healthy meals per week through [facility] for 4 weeks.   Completed referral sent in for review.  Anticipate patient will receive first shipment of food in 1-3 business days.  Jorge Ny, LCSW Clinical Social Worker [facility] Desk#: [phone] Cell#: [phone]

## 2019-03-05 NOTE — Patient Instructions (Signed)
Medication Instructions:  Your physician recommends that you continue on your current medications as directed. Please refer to the Current Medication list given to you today.  *If you need a refill on your cardiac medications before your next appointment, please call your pharmacy*  Lab Work: None ordered If you have labs (blood work) drawn today and your tests are completely normal, you will receive your results only by: Marland Kitchen MyChart Message (if you have MyChart) OR . A paper copy in the mail If you have any lab test that is abnormal or we need to change your treatment, we will call you to review the results.  Testing/Procedures: None ordered  Follow-Up: At [facility], you and your health needs are our priority.  As part of our continuing mission to provide you with exceptional heart care, we have created designated Provider Care Teams.  These Care Teams include your primary Cardiologist (physician) and Advanced Practice Providers (APPs -  Physician Assistants and Nurse Practitioners) who all work together to provide you with the care you need, when you need it.  Your next appointment:   4 month(s)  The format for your next appointment:   In Person  Provider:    You may see Ida Rogue, MD or one of the following Advanced Practice Providers on your designated Care Team:    Murray Hodgkins, NP  Christell Faith, PA-C  Marrianne Mood, PA-C   Other Instructions You have been given the [facility] telephone number. They will help you find a primary care physician.

## 2019-03-05 NOTE — Progress Notes (Signed)
Office Visit    Patient Name: Karen [name] Date of Encounter: 03/05/2019  Primary Care Provider:  Ricardo Jericho, NP Primary Cardiologist:  Ida Rogue, MD Electrophysiologist:  None   Chief Complaint    Karen [name] is a 73 y.o. female with a hx of HFpEF, junctional bradycardia, CKD 3, GI bleed, DM2, HTN, HLD, chronic hypoxic respiratory failure secondary to COPD on home oxygen, anemia, obesity presents today for hospital follow-up  Past Medical History    Past Medical History:  Diagnosis Date  . (HFpEF) heart failure with preserved ejection fraction ([place]) 2017   a. 2017 Echo: EF 50%; b. 06/2018 Echo: EF 50-55%; c. 08/2018 Echo: EF 50-55%, Nl RV fxn. RVSP 60.71mmHg. Mild BAE. Mild to mod TR.     Marland Kitchen Acute on chronic respiratory failure with hypoxia and hypercapnia ([place]) 01/07/2015  . Anemia   . 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 Asthma   . Cataract   . CKD (chronic kidney disease), stage III   . COPD (chronic obstructive pulmonary disease) ([place])    (1) 06/2018 tobacco use, home 3L oxygen   . Diabetes mellitus without complication (HCC)    (1) A1C 7.7 (06/2018)  . Edema, peripheral 04/20/2014  . History of kidney stones   . Hyperlipidemia   . Hypertension   . Iron deficiency anemia 06/22/2014  . Junctional bradycardia    a. In setting of beta blocker therapy.  . Leucocytosis 10/19/2015  . Morbid obesity ([place])   . Overactive bladder   . Primary osteoarthritis of right knee 09/01/2016  . Sciatica 01/07/2015   Past Surgical History:  Procedure Laterality Date  . APPENDECTOMY    . CESAREAN SECTION     x3  . CHOLECYSTECTOMY    . COLONOSCOPY WITH PROPOFOL N/A 08/28/2017   Procedure: COLONOSCOPY WITH PROPOFOL;  Surgeon: Lucilla Lame, MD;  Location: [facility] ENDOSCOPY;  Service: Endoscopy;  Laterality: N/A;  . COLONOSCOPY WITH PROPOFOL N/A 08/29/2017   Procedure: COLONOSCOPY WITH PROPOFOL;  Surgeon: Lucilla Lame, MD;  Location: [facility] ENDOSCOPY;  Service: Endoscopy;  Laterality:  N/A;  . CYSTOSCOPY W/ URETERAL STENT PLACEMENT Right 09/15/2017   Procedure: CYSTOSCOPY WITH RETROGRADE PYELOGRAM/URETERAL STENT PLACEMENT;  Surgeon: Cleon Gustin, MD;  Location: [facility] ORS;  Service: Urology;  Laterality: Right;  . CYSTOSCOPY/URETEROSCOPY/HOLMIUM LASER/STENT PLACEMENT Right 10/09/2017   Procedure: CYSTOSCOPY/URETEROSCOPY/HOLMIUM LASER/STENT PLACEMENT;  Surgeon: Abbie Sons, MD;  Location: [facility] ORS;  Service: Urology;  Laterality: Right;  right Stent exchange  . ESOPHAGOGASTRODUODENOSCOPY (EGD) WITH PROPOFOL N/A 09/16/2018   Procedure: ESOPHAGOGASTRODUODENOSCOPY (EGD) WITH PROPOFOL;  Surgeon: Lin Landsman, MD;  Location: [place];  Service: Gastroenterology;  Laterality: N/A;  . EYE SURGERY      Allergies  Allergies  Allergen Reactions  . Ace Inhibitors Hives  . Beta Adrenergic Blockers     Junctional bradycardia  . Gabapentin Hives  . Lisinopril Hives  . Lyrica [Pregabalin] Hives  . Shrimp [Shellfish Allergy] Swelling    Swelling of the lips    History of Present Illness    Karen [name] is a 73 y.o. female with a hx of HFpEF, junctional bradycardia, CKD 3, GI bleed, DM2, HTN, HLD, chronic hypoxic respiratory failure secondary to COPD on home oxygen, anemia, obesity last seen while hospitalized.  Echo 06/2018 in succession angina normal LV SF with EF 50 to 55%.  Admitted 6-20 20 with junctional bradycardia and hypotension.  Bisoprolol was stopped.  Required a brief course of dopamine with subsequent stabilization and discharge  without recommendation for PPM.  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  Required 1 unit PRBC.  Admitted 02/13/19-02/16/19 with junctional bradycardia.  Echo LVEF 65 to 70%, no R WMA, trivial MR/TR/PR, moderately elevated PASP.  Noted still taking bisoprolol despite medication have been discontinued.  Volume up and  improved with IV Lasix, diuresis of 9 L.  It was discovered she had picked up a noncancer prescription of his upper left [employer].  Return to [facility] 02/23/2019 with 2-day history of worsening SLB, orthopnea, cough.  She was fairly certain she again restarted bisoprolol Biaxin.  EKG junctional bradycardia 39 bpm without acute ST/T wave changes.  She was placed on IV Lasix and diuresed.  A ZIO monitor was placed prior to discharge on 02/27/19.   She reports feeling well since discharge.  She denies lightheadedness, dizziness, palpitations.  She reports no shortness of breath at rest.  Tells me her DOE is at her baseline.  She continues to wear her home oxygen as prescribed.  She tells me she has gotten rid of all of her bisoprolol that was in the house and thrown it away.  She was scheduled for follow-up with heart failure clinic tomorrow but is planning to reschedule this as she does not like to have 2 visits 2 days in a row.  She continues to weigh at home and her weights have been stable.  EKGs/Labs/Other Studies Reviewed:   The following studies were reviewed today:  Echo 02/14/2019  1. Left ventricular ejection fraction, by visual estimation, is 65 to 70%. The left ventricle has normal function. Left ventricular septal wall thickness was normal. Normal left ventricular posterior wall thickness. There is no left ventricular  hypertrophy.  2. The left ventricle has no regional wall motion abnormalities.  3. Global right ventricle has normal systolic function.The right ventricular size is normal. No increase in right ventricular wall thickness.  4. Left atrial size was normal.  5. Right atrial size was normal.  6. The mitral valve is grossly normal. Trivial mitral valve regurgitation.  7. The tricuspid valve is grossly normal. Tricuspid valve regurgitation is trivial.  8. The aortic valve was not well visualized. Aortic valve regurgitation is not visualized.  9. The pulmonic valve was not well  visualized. Pulmonic valve regurgitation is trivial. 10. The aortic root was not well visualized. 11. Moderately elevated pulmonary artery systolic pressure. 12. The atrial septum is grossly normal.   EKG:  EKG is ordered today.  The ekg ordered today demonstrates sinus rhythm 77 bpm with sinus arrhythmia  Recent Labs: 08/05/2018: Magnesium 2.4 09/14/2018: ALT 13 02/23/2019: B Natriuretic Peptide 1,897.0; TSH 4.029 02/24/2019: Hemoglobin 9.8; Platelets 253 02/27/2019: BUN 41; Creatinine, Ser 0.98; Potassium 4.2; Sodium 138  Recent Lipid Panel No results found for: CHOL, TRIG, HDL, CHOLHDL, VLDL, LDLCALC, LDLDIRECT  Home Medications   Current Meds  Medication Sig  . albuterol (VENTOLIN HFA) 108 (90 Base) MCG/ACT inhaler INHALE 2 PUFFS BY MOUTH EVERY 6 HOURS AS NEEDED FOR WHEEZING  . atorvastatin (LIPITOR) 10 MG tablet Take 1 tablet by mouth daily.  Marland Kitchen esomeprazole (NEXIUM) 40 MG capsule Take 40 mg by mouth daily.  . Ferrous Sulfate (IRON) 325 (65 Fe) MG TABS Take 1 tablet by mouth daily.  . fluticasone (FLONASE) 50 MCG/ACT nasal spray Place 2 sprays into both nostrils daily.  . Fluticasone-Umeclidin-Vilant 100-62.5-25 MCG/INH AEPB Inhale 1 puff into the lungs daily.  Marland Kitchen  furosemide (LASIX) 20 MG tablet Take 1 tablet (20 mg total) by mouth daily.  . Insulin Human (INSULIN PUMP) SOLN Inject 1 each into the skin 3 times daily with meals, bedtime and 2 AM. (Patient taking differently: Inject 1 each into the skin 3 times daily with meals, bedtime and 2 AM. Humalog Insulin 76 units daily)  . ipratropium-albuterol (DUONEB) 0.5-2.5 (3) MG/3ML SOLN Inhale 3 mLs into the lungs 4 (four) times daily.  Marland Kitchen losartan-hydrochlorothiazide (HYZAAR) 100-25 MG tablet Take 1 tablet by mouth daily.  . metFORMIN (GLUCOPHAGE) 500 MG tablet Take 1,000 mg by mouth 2 (two) times daily with a meal.   . montelukast (SINGULAIR) 10 MG tablet Take 10 mg by mouth at bedtime.  . potassium chloride (KLOR-CON) 10 MEQ tablet Take  1 tablet (10 mEq total) by mouth daily.  . solifenacin (VESICARE) 10 MG tablet Take 1 tablet (10 mg total) by mouth daily.  . vitamin B-12 (CYANOCOBALAMIN) 500 MCG tablet Take 500 mcg by mouth daily.    Review of Systems    Review of Systems  Constitution: Negative for chills, fever and malaise/fatigue.  Cardiovascular: Positive for dyspnea on exertion. Negative for chest pain, irregular heartbeat, leg swelling, near-syncope, orthopnea, palpitations and syncope.  Respiratory: Negative for cough, shortness of breath and wheezing.   Gastrointestinal: Negative for melena, nausea and vomiting.  Genitourinary: Negative for hematuria.  Neurological: Negative for dizziness, light-headedness and weakness.   All other systems reviewed and are otherwise negative except as noted above.  Physical Exam    VS:  BP 122/62   Pulse 77   Ht 5\' 3"  (1.6 m)   Wt 211 lb (95.7 kg)   SpO2 97%   BMI 37.38 kg/m  , BMI Body mass index is 37.38 kg/m. GEN: Well nourished, overweight, well developed, in no acute distress. HEENT: normal. Neck: Supple, no JVD, carotid bruits, or masses. Cardiac: RRR, no murmurs, rubs, or gallops. No clubbing, cyanosis, edema.  Radials/PT 2+ and equal bilaterally.  Respiratory:  Respirations regular and unlabored, clear to auscultation bilaterally. Bases diminished bilaterally likely due to body habitus. On her home O2 at time of exam. GI: Soft, nontender, nondistended, BS + x 4. MS: No deformity or atrophy. Skin: Warm and dry, no rash. Neuro:  Strength and sensation are intact. Psych: Normal affect.  Assessment & Plan    1. HFpEF/pulmonary hypertension -NYHA II-3.  Euvolemic on exam today and well compensated.  Continue Lasix, potassium, ARB  Continue low-sodium diet.  2. Beta-blocker induced junctional bradycardia -reassures me that she has thrown away all of the bisoprolol at home as she had recurrent episodes and hospitalizations for junctional bradycardia after  accidentally resuming bisoprolol.  EKG today sinus rhythm with sinus arrhythmia.  She is wearing a ZIO monitor and notes that it causes her skin some irritation.  She has worn for 1.5 weeks, will have her remove in the mail today.  Results of ZIO will allow [place] to determine whether or not PPM is needed.  3. Chronic hypoxic respiratory failure/COPD -on baseline 3 L via nasal cannula.  Continue to follow with pulmonology.  4. HTN -BP well controlled today.  Continue present antihypertensive regimen including Lasix 20 mg, losartan-HCTZ 100-25.  5. HLD -01/23/2019 LDL 44.  Continue atorvastatin.  6. Venous insufficiency -varicose veins noted on exam today.  Recommend continued lower extremity elevation.  Consider compression stockings.  Continue low-sodium diet.  Continue Lasix, as above.  7. DM2 -follows with PCP.  Consider addition of dapagliflozin  for heart failure and cardioprotective benefit.  Disposition: Encouraged to call HF clinic for f/u appt. Follow up in 4 month(s) with Dr. Rockey Situ or APP.   Loel Dubonnet, NP 03/05/2019, 2:53 PM

## 2019-03-06 ENCOUNTER — Ambulatory Visit: Payer: Medicare Other | Admitting: Family

## 2019-03-10 ENCOUNTER — Other Ambulatory Visit: Payer: Self-pay

## 2019-03-10 DIAGNOSIS — R001 Bradycardia, unspecified: Secondary | ICD-10-CM

## 2019-03-12 ENCOUNTER — Encounter (HOSPITAL_COMMUNITY): Payer: Self-pay

## 2019-03-12 ENCOUNTER — Other Ambulatory Visit (HOSPITAL_COMMUNITY): Payer: Self-pay

## 2019-03-12 NOTE — Progress Notes (Signed)
Had a visit with Karen [name] today.  She states doing and feeling better.  She is aware of appts.  She has all her medications and verified them.  She is aware of how to take them.  She states weight has been staying at 211 to 212 lbs since discharge.  She has been signed up for [employer] meals which helps her to know what to eat.  We discussed 60 ounces of fluid a day, she has not been measuring it, she states she will start.  Discussed low sodium food options.  She denies chest pain, shortness of breath.  She states has no swelling in lower extremity and denies abdominal tightness.  She lives with her husband that helps her.  She is able to ambulate by walker and do most things for her self.  She is aware to call me with any problems or questions.  Will continue to visit for heart failure, medication management and diet.   [place] [phone]

## 2019-03-18 ENCOUNTER — Other Ambulatory Visit: Payer: Self-pay | Admitting: Urology

## 2019-04-02 ENCOUNTER — Other Ambulatory Visit: Payer: Self-pay | Admitting: Cardiovascular Disease

## 2019-04-03 ENCOUNTER — Telehealth: Payer: Self-pay

## 2019-04-03 DIAGNOSIS — I5032 Chronic diastolic (congestive) heart failure: Secondary | ICD-10-CM

## 2019-04-03 DIAGNOSIS — I10 Essential (primary) hypertension: Secondary | ICD-10-CM

## 2019-04-03 NOTE — Telephone Encounter (Signed)
From discharge from the hospital 02/27/19, patient was to be taking furosemide 20 mg daily. Last OV note has patient taking furosemide 20 mg daily.  Routing to Laurann Montana, NP for review.

## 2019-04-03 NOTE — Telephone Encounter (Signed)
States that in VM we asked the dosage of the Lasix - it is 40 MG Please call with any other questions or concerns

## 2019-04-03 NOTE — Telephone Encounter (Signed)
Left a message on patient voice mail to contact our office regarding a refill on Lasix.

## 2019-04-03 NOTE — Telephone Encounter (Signed)
Please review for refill on Lasix 20 mg one tablet daily.  The patient states, she is taking Lasix 40 mg daily.

## 2019-04-03 NOTE — Telephone Encounter (Signed)
Would like her to decrease to the prescribed Lasix 20mg  daily to protect kidney function. Please have her come to the [place] early next week for a BMET and ProBNP. Would like her to weight daily and keep a log. Please schedule her for follow up in our office ([place] or APP) or with HF clinic in 1-2 weeks and ask her to bring medications with her.  Thank you, Loel Dubonnet, NP

## 2019-04-04 MED ORDER — FUROSEMIDE 20 MG PO TABS: 20.0000 mg | ORAL_TABLET | Freq: Every day | ORAL | 3 refills | Status: DC

## 2019-04-04 NOTE — Telephone Encounter (Signed)
No answer. Left message to call back to please go over recommendations and plan of care. Went ahead and sent in furosemide 20 mg daily.

## 2019-04-04 NOTE — Telephone Encounter (Signed)
No answer. Left message to call back.   

## 2019-04-07 ENCOUNTER — Telehealth: Payer: Self-pay | Admitting: *Deleted

## 2019-04-07 DIAGNOSIS — I4891 Unspecified atrial fibrillation: Secondary | ICD-10-CM

## 2019-04-07 NOTE — Telephone Encounter (Signed)
Left voicemail message to call back  

## 2019-04-07 NOTE — Telephone Encounter (Signed)
Spoke with patient. She verbalized understanding to take furosemide 20 mg daily and is aware a refill was sent in on 04/04/19. She is unable to come later this week anytime for lab work or an appointment. She has transportation issues. Her husband has appointment at the [facility] in [place] on 04/14/19, so patient is agreeable to go to the [facility] there for the lab work. She is able to come to an appointment on 04/16/19. Laurann Montana, NP verbally made aware.

## 2019-04-07 NOTE — Telephone Encounter (Signed)
No answer. Left message to call back with patient and patient's husband's number. Asked to please call back to go over changes, needed lab work and to schedule f/u appointment.

## 2019-04-07 NOTE — Telephone Encounter (Signed)
Patient calling back. Please return call when able 

## 2019-04-07 NOTE — Telephone Encounter (Signed)
-----   Message from [name], MD sent at 04/06/2019  9:43 PM EST ----- Event monitor shows atrial fibrillation, frequent episodes Would start eliquis 5 BID given high risk of CVA Cbc in one month after starting

## 2019-04-08 NOTE — Telephone Encounter (Signed)
Patient is returning your call.  

## 2019-04-08 NOTE — Telephone Encounter (Signed)
Left voicemail message to please call back for recommendations.

## 2019-04-09 MED ORDER — APIXABAN 5 MG PO TABS: 5.0000 mg | ORAL_TABLET | Freq: Two times a day (BID) | ORAL | 11 refills | Status: DC

## 2019-04-09 NOTE — Telephone Encounter (Signed)
Spoke with patient and reviewed provider recommendations to start Eliquis 5 mg twice daily due to monitor showing periods of atrial fibrillation. Also entered orders for her to have labs done in one month at the [facility] Entr[facility] of the hospital. Confirmed upcoming appointment with provided and advised that if Eliquis cost is too high to let us know and we can assist with patient assistance application process. She verbalized understanding of our conversation, agreement with plan, and had no further questions at this time.

## 2019-04-09 NOTE — Telephone Encounter (Signed)
PAtient returning phone call, please return call when able

## 2019-04-16 ENCOUNTER — Encounter: Payer: Self-pay | Admitting: Family

## 2019-04-16 ENCOUNTER — Other Ambulatory Visit: Payer: Self-pay

## 2019-04-16 ENCOUNTER — Ambulatory Visit (INDEPENDENT_AMBULATORY_CARE_PROVIDER_SITE_OTHER): Payer: Medicare Other | Admitting: Family

## 2019-04-16 VITALS — BP 140/60 | HR 97 | Ht 63.0 in | Wt 218.5 lb

## 2019-04-16 DIAGNOSIS — I4891 Unspecified atrial fibrillation: Secondary | ICD-10-CM | POA: Diagnosis not present

## 2019-04-16 DIAGNOSIS — E782 Mixed hyperlipidemia: Secondary | ICD-10-CM

## 2019-04-16 DIAGNOSIS — Z7901 Long term (current) use of anticoagulants: Secondary | ICD-10-CM

## 2019-04-16 DIAGNOSIS — I5032 Chronic diastolic (congestive) heart failure: Secondary | ICD-10-CM

## 2019-04-16 DIAGNOSIS — R001 Bradycardia, unspecified: Secondary | ICD-10-CM | POA: Diagnosis not present

## 2019-04-16 DIAGNOSIS — E1165 Type 2 diabetes mellitus with hyperglycemia: Secondary | ICD-10-CM

## 2019-04-16 DIAGNOSIS — Z794 Long term (current) use of insulin: Secondary | ICD-10-CM

## 2019-04-16 DIAGNOSIS — I480 Paroxysmal atrial fibrillation: Secondary | ICD-10-CM | POA: Diagnosis not present

## 2019-04-16 DIAGNOSIS — I10 Essential (primary) hypertension: Secondary | ICD-10-CM

## 2019-04-16 NOTE — Patient Instructions (Addendum)
Medication Instructions:  No medication changes today.  *If you need a refill on your cardiac medications before your next appointment, please call your pharmacy*  Lab Work: Your physician recommends that you return for lab work today: BNP, BMET  If you have labs (blood work) drawn today and your tests are completely normal, you will receive your results only by: Marland Kitchen MyChart Message (if you have MyChart) OR . A paper copy in the mail If you have any lab test that is abnormal or we need to change your treatment, we will call you to review the results.  Testing/Procedures: You had an EKG today. It showed normal sinus rhythm.   Follow-Up: At [facility], you and your health needs are our priority.  As part of our continuing mission to provide you with exceptional heart care, we have created designated Provider Care Teams.  These Care Teams include your primary Cardiologist (physician) and Advanced Practice Providers (APPs -  Physician Assistants and Nurse Practitioners) who all work together to provide you with the care you need, when you need it.  Your next appointment:  As scheduled in May     Atrial Fibrillation  Atrial fibrillation is a type of heartbeat that is irregular or fast. If you have this condition, your heart beats without any order. This makes it hard for your heart to pump blood in a normal way. Atrial fibrillation may come and go, or it may become a long-lasting problem. If this condition is not treated, it can put you at higher risk for stroke, heart failure, and other heart problems. What are the causes? This condition may be caused by diseases that damage the heart. They include:  High blood pressure.  Heart failure.  Heart valve disease.  Heart surgery. Other causes include:  Diabetes.  Thyroid disease.  Being overweight.  Kidney disease. Sometimes the cause is not known. What increases the risk? You are more likely to develop this condition  if:  You are older.  You smoke.  You exercise often and very hard.  You have a family history of this condition.  You are a man.  You use drugs.  You drink a lot of alcohol.  You have lung conditions, such as emphysema, pneumonia, or COPD.  You have sleep apnea. What are the signs or symptoms? Common symptoms of this condition include:  A feeling that your heart is beating very fast.  Chest pain or discomfort.  Feeling short of breath.  Suddenly feeling light-headed or weak.  Getting tired easily during activity.  Fainting.  Sweating. In some cases, there are no symptoms. How is this treated? Treatment for this condition depends on underlying conditions and how you feel when you have atrial fibrillation. They include:  Medicines to: ? Prevent blood clots. ? Treat heart rate or heart rhythm problems.  Using devices, such as a pacemaker, to correct heart rhythm problems.  Doing surgery to remove the part of the heart that sends bad signals.  Closing an area where clots can form in the heart (left atrial appendage). In some cases, your doctor will treat other underlying conditions. Follow these instructions at home: Medicines  Take over-the-counter and prescription medicines only as told by your doctor.  Do not take any new medicines without first talking to your doctor.  If you are taking blood thinners: ? Talk with your doctor before you take any medicines that have aspirin or NSAIDs, such as ibuprofen, in them. ? Take your medicine exactly as told  by your doctor. Take it at the same time each day. ? Avoid activities that could hurt or bruise you. Follow instructions about how to prevent falls. ? Wear a bracelet that says you are taking blood thinners. Or, carry a card that lists what medicines you take. Lifestyle      Do not use any products that have nicotine or tobacco in them. These include cigarettes, e-cigarettes, and chewing tobacco. If you need  help quitting, ask your doctor.  Eat heart-healthy foods. Talk with your doctor about the right eating plan for you.  Exercise regularly as told by your doctor.  Do not drink alcohol.  Lose weight if you are overweight.  Do not use drugs, including cannabis. General instructions  If you have a condition that causes breathing to stop for a short period of time (apnea), treat it as told by your doctor.  Keep a healthy weight. Do not use diet pills unless your doctor says they are safe for you. Diet pills may make heart problems worse.  Keep all follow-up visits as told by your doctor. This is important. Contact a doctor if:  You notice a change in the speed, rhythm, or strength of your heartbeat.  You are taking a blood-thinning medicine and you get more bruising.  You get tired more easily when you move or exercise.  You have a sudden change in weight. Get help right away if:   You have pain in your chest or your belly (abdomen).  You have trouble breathing.  You have side effects of blood thinners, such as blood in your vomit, poop (stool), or pee (urine), or bleeding that cannot stop.  You have any signs of a stroke. "BE FAST" is an easy way to remember the main warning signs: ? B - Balance. Signs are dizziness, sudden trouble walking, or loss of balance. ? E - Eyes. Signs are trouble seeing or a change in how you see. ? F - Face. Signs are sudden weakness or loss of feeling in the face, or the face or eyelid drooping on one side. ? A - Arms. Signs are weakness or loss of feeling in an arm. This happens suddenly and usually on one side of the body. ? S - Speech. Signs are sudden trouble speaking, slurred speech, or trouble understanding what people say. ? T - Time. Time to call emergency services. Write down what time symptoms started.  You have other signs of a stroke, such as: ? A sudden, very bad headache with no known cause. ? Feeling like you may vomit  (nausea). ? Vomiting. ? A seizure. These symptoms may be an emergency. Do not wait to see if the symptoms will go away. Get medical help right away. Call your local emergency services (911 in the [place] Do not drive yourself to the hospital. Summary  Atrial fibrillation is a type of heartbeat that is irregular or fast.  You are at higher risk of this condition if you smoke, are older, have diabetes, or are overweight.  Follow your doctor's instructions about medicines, diet, exercise, and follow-up visits.  Get help right away if you have signs or symptoms of a stroke.  Get help right away if you cannot catch your breath, or you have chest pain or discomfort. This information is not intended to replace advice given to you by your health care provider. Make sure you discuss any questions you have with your health care provider. Document Revised: 08/14/2018 Document Reviewed: 08/14/2018 Elsevier Patient  Education  2020 Elsevier Inc.  

## 2019-04-16 NOTE — Progress Notes (Signed)
Office Visit    Patient Name: Karen [name] Date of Encounter: 04/16/2019  Primary Care Provider:  Ricardo Jericho, NP Primary Cardiologist:  Karen Rogue, MD Electrophysiologist:  None   Chief Complaint    Karen [name] is a 74 y.o. female with a hx of HFpEF, junctional bradycardia, CKD 3, GI bleed, DM2, HTN, HLD, chronic hypoxic respiratory failure secondary to COPD on home oxygen, anemia, obesity  presents today for diuretic medication management.   Past Medical History    Past Medical History:  Diagnosis Date  . (HFpEF) heart failure with preserved ejection fraction ([place]) 2017   a. 2017 Echo: EF 50%; b. 06/2018 Echo: EF 50-55%; c. 08/2018 Echo: EF 50-55%, Nl RV fxn. RVSP 60.61mHg. Mild BAE. Mild to mod TR.     .Marland KitchenAcute on chronic respiratory failure with hypoxia and hypercapnia ([place] 01/07/2015  . Anemia   . 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 Asthma   . Cataract   . CKD (chronic kidney disease), stage III   . COPD (chronic obstructive pulmonary disease) ([facility]    (1) 06/2018 tobacco use, home 3L oxygen   . Diabetes mellitus without complication (HCC)    (1) A1C 7.7 (06/2018)  . Edema, peripheral 04/20/2014  . History of kidney stones   . Hyperlipidemia   . Hypertension   . Iron deficiency anemia 06/22/2014  . Junctional bradycardia    a. In setting of beta blocker therapy.  . Leucocytosis 10/19/2015  . Morbid obesity ([place]   . Overactive bladder   . Primary osteoarthritis of right knee 09/01/2016  . Sciatica 01/07/2015   Past Surgical History:  Procedure Laterality Date  . APPENDECTOMY    . CESAREAN SECTION     x3  . CHOLECYSTECTOMY    . COLONOSCOPY WITH PROPOFOL N/A 08/28/2017   Procedure: COLONOSCOPY WITH PROPOFOL;  Surgeon: WLucilla Lame MD;  [facility]  Service: Endoscopy;  Laterality: N/A;  . COLONOSCOPY WITH PROPOFOL N/A 08/29/2017   Procedure: COLONOSCOPY WITH PROPOFOL;  Surgeon: WLucilla Lame MD;  [facility];  Service: Endoscopy;   Laterality: N/A;  . CYSTOSCOPY Karen/ URETERAL STENT PLACEMENT Right 09/15/2017   Procedure: CYSTOSCOPY WITH RETROGRADE PYELOGRAM/URETERAL STENT PLACEMENT;  Surgeon: Karen Gustin MD;  Location: [facility] ORS;  Service: Urology;  Laterality: Right;  . CYSTOSCOPY/URETEROSCOPY/HOLMIUM LASER/STENT PLACEMENT Right 10/09/2017   Procedure: CYSTOSCOPY/URETEROSCOPY/HOLMIUM LASER/STENT PLACEMENT;  Surgeon: SAbbie Sons MD;  Location: [facility] ORS;  Service: Urology;  Laterality: Right;  right Stent exchange  . ESOPHAGOGASTRODUODENOSCOPY (EGD) WITH PROPOFOL N/A 09/16/2018   Procedure: ESOPHAGOGASTRODUODENOSCOPY (EGD) WITH PROPOFOL;  Surgeon: VLin Landsman MD;  [facility]  Service: Gastroenterology;  Laterality: N/A;  . EYE SURGERY      Allergies  Allergies  Allergen Reactions  . Ace Inhibitors Hives  . Beta Adrenergic Blockers     Junctional bradycardia  . Gabapentin Hives  . Lisinopril Hives  . Lyrica [Pregabalin] Hives  . Shrimp [Shellfish Allergy] Swelling    Swelling of the lips    History of Present Illness    Karen DEROCHERis a 74y.o. female with a hx of HFpEF, junctional bradycardia, CKD 3, GI bleed, DM2, HTN, HLD, PAF, chronic hypoxic respiratory failure secondary to COPD on home oxygen, anemia, obesity last seen 03/05/19.  Echo 06/2018 in succession angina normal LV SF with EF 50 to 55%.  Admitted 6-20 20 with junctional bradycardia and hypotension.  Bisoprolol was stopped.  Required a brief course of dopamine with subsequent  stabilization and discharge without recommendation for PPM.  Readmitted 09/2018 with GI bleed and hematemesis with EGD showing duodenal iangiectasia Karen/p argon plasma coagulation and gastric ulcer Karen/p cauterization.  Required 1 unit PRBC.  Admitted 02/13/19-02/16/19 with junctional bradycardia.  Echo LVEF 65 to 70%, no R WMA, trivial MR/TR/PR, moderately elevated PASP.  Noted still taking bisoprolol despite medication have been discontinued.  Volume  up and improved with IV Lasix, diuresis of 9 L.  It was discovered she had picked up a noncancelled prescription at [facility].   Return to [facility] 02/23/2019 with 2-day history of worsening SLB, orthopnea, cough.  She was fairly certain she again restarted bisoprolol Biaxin.  EKG junctional bradycardia 39 bpm without acute ST/T wave changes.  She was placed on IV Lasix and diuresed.  A ZIO monitor was placed prior to discharge on 02/27/19.   At follow up 03/05/19 she was feeling well. During a subsequent telephone encounter it was noted she was taking Lasix 16m instead of her prescribed Lasix 237m She was asked to resume Lasix 2041maily for renal protection with recommendation for BMET/BNP.   Her event monitor showed episodes of atrial fibrillation. She was started on Eliquis 5mg64mD.  Tells me she is unaware of her atrial fibrillation.  Denies palpitations.  We long discussion with guarding the pathophysiology of atrial fibrillation and why Eliquis was needed.  She was agreeable to continue and tells me this medication was affordable.  She reports since changing her Lasix dose from 40 mg to 20 mg daily she has noted no new shortness of breath nor new lower extremity edema.  She is euvolemic on exam.  Denies chest pain, pressure, tightness.  EKGs/Labs/Other Studies Reviewed:   The following studies were reviewed today: Zio monitor 04/06/19 Normal sinus rhythm with paroxysmal atrial fibrillation   avg HR of 89 bpm.  1 run of Ventricular Tachycardia occurred lasting 4 beats with a max rate of 179 bpm (avg 161 bpm).    Atrial Fibrillation occurred (<1% burden), ranging from 75-179 bpm (avg of 117 bpm), the longest lasting 32 mins 50 secs with an avg rate of 119 bpm.    Isolated SVEs were occasional (1.4%, 25270), SVE Couplets were rare (<1.0%, 2710), and SVE Triplets were rare (<1.0%, 299). Isolated VEs were rare (<1.0%, 617), VE Couplets were rare (<1.0%, 4), and VE Triplets were rare (<1.0%, 1).  Ventricular Bigeminy was present.   EKG:  EKG is ordered today.  The ekg ordered today demonstrates sinus rhythm 97 bpm with single PVC  Recent Labs: 08/05/2018: Magnesium 2.4 09/14/2018: ALT 13 02/23/2019: B Natriuretic Peptide 1,897.0; TSH 4.029 02/24/2019: Hemoglobin 9.8; Platelets 253 02/27/2019: BUN 41; Creatinine, Ser 0.98; Potassium 4.2; Sodium 138  Recent Lipid Panel No results found for: CHOL, TRIG, HDL, CHOLHDL, VLDL, LDLCALC, LDLDIRECT  Home Medications   No outpatient medications have been marked as taking for the 04/16/19 encounter (Appointment) with WalkLoel Dubonnet.      Review of Systems      Review of Systems  Constitution: Negative for chills, fever and malaise/fatigue.  Cardiovascular: Positive for dyspnea on exertion. Negative for chest pain, irregular heartbeat, leg swelling, near-syncope, orthopnea, palpitations and syncope.  Respiratory: Negative for cough, shortness of breath and wheezing.   Gastrointestinal: Negative for melena, nausea and vomiting.  Genitourinary: Negative for hematuria.  Neurological: Negative for dizziness, light-headedness and weakness.   All other systems reviewed and are otherwise negative except as noted above.  Physical Exam    VS:  There were no vitals taken for this visit. , BMI There is no height or weight on file to calculate BMI. GEN: Well nourished, overweight, well developed, in no acute distress. HEENT: normal. Neck: Supple, no JVD, carotid bruits, or masses. Cardiac: RRR, no murmurs, rubs, or gallops. No clubbing, cyanosis, edema.  Radials/PT 2+ and equal bilaterally.  Respiratory:  Respirations regular and unlabored.  Diminished bases bilaterally.  On 3 L of oxygen at time of exam GI: Soft, nontender, nondistended. MS: No deformity or atrophy. Skin: Warm and dry, no rash. Neuro:  Strength and sensation are intact. Psych: Normal affect.  Accessory Clinical Findings    ECG personally reviewed by me today -sinus  rhythm at 97 bpm with single PVC- no acute changes.  Assessment & Plan    1. HFpEF/pulmonary HTN - GDMT includes Lasix, potassium, ARB. No beta blocker secondary to hx of beta blocker induced junctional bradycardia.  She is euvolemic and well compensated on exam.  NYHA II.  Continue Lasix 20 mg daily.  BMP and BNP today.  2. Beta blocker induced junctional bradycardia - Remains off beta-blocker.  EKG today sinus rhythm with single PVC.  Would avoid rate limiting agents.  Recent ZIO monitor with no significant pauses-no indication for PPM at this time.  3. PAF - Noted on recent ZIO monitor. Eliquis 64m BID started.  She is asymptomatic and in sinus rhythm today.  Avoid rate lowering agents, as above.  4. Chronic anticoagulation - Secondary to PAF.  Denies bleeding complications.  Educated to report melena, hematuria and to present to ED if she falls and hits her head.  Continue Eliquis 5 mg twice daily.  5. HTN -BP mildly elevated today but was well controlled at previous office visit.  She is agreeable to monitor at home.  Continue Lasix 20 mg, losartan-HCTZ 100-25.  6. HLD - 01/23/19 LDL 44. Continue Atorvastatin.   7. DM2 - Follows with PCP. Consider addition of dapagliflozin for heart failure and cardioprotective benefit.  8. Obesity - Weight loss via healthy diet and regular exercise encouraged.   Disposition: To be met, BMP today.  CBC in 4 weeks.  Follow up in 3 month(Karen) with Dr. GRockey Situ  CLoel Dubonnet NP 04/16/2019, 1:16 PM

## 2019-04-17 LAB — BRAIN NATRIURETIC PEPTIDE: BNP: 18.9 pg/mL (ref 0.0–100.0)

## 2019-04-17 LAB — BASIC METABOLIC PANEL

## 2019-04-21 ENCOUNTER — Telehealth: Payer: Self-pay | Admitting: Family

## 2019-04-21 ENCOUNTER — Telehealth (HOSPITAL_COMMUNITY): Payer: Self-pay

## 2019-04-21 MED ORDER — FUROSEMIDE 20 MG PO TABS: ORAL_TABLET | ORAL | 3 refills | Status: DC

## 2019-04-21 NOTE — Telephone Encounter (Signed)
Received call from Nile Dear, EMT regarding Ms. Mesaros.  Reports Ms. Powe has gained 4 pounds over the last week.  She is not more short of breath but does notice a little bit more swelling in her legs.  Steffanie Dunn was able to assist the patient to get Mom's Meals through [facility] which are low sodium over the summer for 12 weeks. With lower sodium intake her fluid volume status improved.  However, the 12-week subsequently ended and she had some recurrent admissions. Steffanie Dunn was able to get her an additional 4 weeks with improvement in volume status, but again this is ended and she seems to be retaining a bit of fluid with eating at home. Steffanie Dunn is going to work on seeing if we can get her [employer] extended.  Ms. Lenius typically takes Lasix 20mg  daily. She will take an additional 20mg  tablet today for a total dose of 40mg . If her weight is above 218 lbs tomorrow, she will take Lasix 40mg  and return to 20mg  daily on Wednesday. If her weight is 218lbs or lower tomorrow, she will resume Lasix 20mg . These verbal orders were relayed to [facility] and she will discuss with patient.  Will update patient's Lasix Rx that she may take an additional 20mg  for weight gain of 2lb overnight or 5lb in 1 week.  Loel Dubonnet, NP

## 2019-04-21 NOTE — Telephone Encounter (Signed)
Had a telephone appt with [name] today.  She states been doing ok.  Would love to have Moms meals back, she states her weight is more stable while she is eating them.  Discussed diet with her and fluids.  She states she is up 4 lbs in a week.  She states has a little bit of fluid in lower legs.  She states her breathing is not effected, she is at her normal.   She has been taking all her medications, verified her meds.  She tries to watch her diet, her husband cooks.  She denies any chest pain, dizziness or headaches.  Contacted Cardiology, spoke with Laurann Montana and advised her of weight gain.  She advised to try an extra lasix 20 mg today and see how her weight is tomorrow.  Alvina Chou of the same.  Caitlin did mention Cardiac rehab and asked Takeyah if she was interested and she advised no, she has enough appts to go to.  Carlee appeared to understand the order of the lasix.  Will contact [name] tomorrow morning to check on weight and her.  Advised her to watch diet and fluids today.  Will continue to visit for heart failure.   [place] [phone]

## 2019-05-27 ENCOUNTER — Telehealth (HOSPITAL_COMMUNITY): Payer: Self-pay | Admitting: Licensed Clinical Social Worker

## 2019-05-27 NOTE — Telephone Encounter (Signed)
Patient identified as a candidate to receive 7 heart healthy meals per week for 4 weeks through [facility].  Completed referral sent in for review.  Anticipate patient will receive first shipment of food in 1-3 business days.  Kenlei Safi H. Mayson Sterbenz, LCSW Clinical Social Worker [facility] Desk#: [phone] Cell#: [phone]  

## 2019-05-28 ENCOUNTER — Telehealth (HOSPITAL_COMMUNITY): Payer: Self-pay

## 2019-05-28 NOTE — Telephone Encounter (Signed)
Today Karen [name] has contacted me back.  She states is doing ok but has gained about 10 lbs in short period of time but her husband states it has been 2 months.  She states her legs are swollen but her breathing is good.  She denies any shortness of breath, chest pain, headaches or dizziness.  She states has all her medication and she has been taking them as prescribed.  She states taking normal dose of her furosemide, she has not took any extra since February.  Did advised her they have approved her for 3 more weeks of Moms meals.  She appreciates them a lot and would love to get them all the time.  She states helps her fluid a lot when she eats them because she eats her husband cooking when she does have them.  Contacted HF clinic and spoke with [name] and she has not seen her in about a year and wants her to come for visit tomorrow, after talking with [name] she does not want to come for a visit.  Advised her to call me in the morning and let me know what her weight is and I will reach out to [facility] since she has seen them a couple months ago and advise them.  She states watching her high sodium foods and measuring fluids.  Will continue to visit for heart failure, medication compliance and diet.   [place] [phone]

## 2019-06-01 ENCOUNTER — Emergency Department
Admission: EM | Admit: 2019-06-01 | Discharge: 2019-06-01 | Disposition: A | Discharge status: Home / Self Care | Payer: Medicare Other | Attending: Emergency Medicine | Admitting: Emergency Medicine

## 2019-06-01 ENCOUNTER — Emergency Department: Payer: Medicare Other

## 2019-06-01 ENCOUNTER — Other Ambulatory Visit: Payer: Self-pay

## 2019-06-01 DIAGNOSIS — N183 Chronic kidney disease, stage 3 unspecified: Secondary | ICD-10-CM | POA: Insufficient documentation

## 2019-06-01 DIAGNOSIS — Z20822 Contact with and (suspected) exposure to covid-19: Secondary | ICD-10-CM | POA: Insufficient documentation

## 2019-06-01 DIAGNOSIS — Z7901 Long term (current) use of anticoagulants: Secondary | ICD-10-CM | POA: Diagnosis not present

## 2019-06-01 DIAGNOSIS — Z87891 Personal history of nicotine dependence: Secondary | ICD-10-CM | POA: Diagnosis not present

## 2019-06-01 DIAGNOSIS — L03115 Cellulitis of right lower limb: Secondary | ICD-10-CM

## 2019-06-01 DIAGNOSIS — R2243 Localized swelling, mass and lump, lower limb, bilateral: Secondary | ICD-10-CM | POA: Diagnosis present

## 2019-06-01 DIAGNOSIS — I509 Heart failure, unspecified: Secondary | ICD-10-CM | POA: Diagnosis not present

## 2019-06-01 DIAGNOSIS — R609 Edema, unspecified: Secondary | ICD-10-CM | POA: Diagnosis not present

## 2019-06-01 DIAGNOSIS — R600 Localized edema: Secondary | ICD-10-CM

## 2019-06-01 DIAGNOSIS — Z794 Long term (current) use of insulin: Secondary | ICD-10-CM | POA: Diagnosis not present

## 2019-06-01 DIAGNOSIS — E1122 Type 2 diabetes mellitus with diabetic chronic kidney disease: Secondary | ICD-10-CM | POA: Insufficient documentation

## 2019-06-01 DIAGNOSIS — Z79899 Other long term (current) drug therapy: Secondary | ICD-10-CM | POA: Diagnosis not present

## 2019-06-01 DIAGNOSIS — J449 Chronic obstructive pulmonary disease, unspecified: Secondary | ICD-10-CM | POA: Diagnosis not present

## 2019-06-01 DIAGNOSIS — E871 Hypo-osmolality and hyponatremia: Secondary | ICD-10-CM | POA: Diagnosis not present

## 2019-06-01 DIAGNOSIS — Z9981 Dependence on supplemental oxygen: Secondary | ICD-10-CM | POA: Diagnosis not present

## 2019-06-01 DIAGNOSIS — I130 Hypertensive heart and chronic kidney disease with heart failure and stage 1 through stage 4 chronic kidney disease, or unspecified chronic kidney disease: Secondary | ICD-10-CM | POA: Diagnosis not present

## 2019-06-01 LAB — CBC WITH DIFFERENTIAL/PLATELET
Abs Immature Granulocytes: 0.05 10*3/uL (ref 0.00–0.07)
Basophils Absolute: 0.1 10*3/uL (ref 0.0–0.1)
Basophils Relative: 1 %
Eosinophils Absolute: 0.3 10*3/uL (ref 0.0–0.5)
Eosinophils Relative: 2 %
HCT: 34.2 % — ABNORMAL LOW (ref 36.0–46.0)
Hemoglobin: 10.9 g/dL — ABNORMAL LOW (ref 12.0–15.0)
Immature Granulocytes: 0 %
Lymphocytes Relative: 13 %
Lymphs Abs: 1.6 10*3/uL (ref 0.7–4.0)
MCH: 28.8 pg (ref 26.0–34.0)
MCHC: 31.9 g/dL (ref 30.0–36.0)
MCV: 90.5 fL (ref 80.0–100.0)
Monocytes Absolute: 0.9 10*3/uL (ref 0.1–1.0)
Monocytes Relative: 8 %
Neutro Abs: 9.5 10*3/uL — ABNORMAL HIGH (ref 1.7–7.7)
Neutrophils Relative %: 76 %
Platelets: 226 10*3/uL (ref 150–400)
RBC: 3.78 MIL/uL — ABNORMAL LOW (ref 3.87–5.11)
RDW: 14.9 % (ref 11.5–15.5)
WBC: 12.5 10*3/uL — ABNORMAL HIGH (ref 4.0–10.5)
nRBC: 0 % (ref 0.0–0.2)

## 2019-06-01 LAB — COMPREHENSIVE METABOLIC PANEL
ALT: 17 U/L (ref 0–44)
AST: 22 U/L (ref 15–41)
Albumin: 3.8 g/dL (ref 3.5–5.0)
Alkaline Phosphatase: 62 U/L (ref 38–126)
Anion gap: 13 (ref 5–15)
BUN: 13 mg/dL (ref 8–23)
CO2: 26 mmol/L (ref 22–32)
Calcium: 9.1 mg/dL (ref 8.9–10.3)
Chloride: 89 mmol/L — ABNORMAL LOW (ref 98–111)
Creatinine, Ser: 0.79 mg/dL (ref 0.44–1.00)
GFR calc Af Amer: >60 mL/min (ref >60)
GFR calc non Af Amer: >60 mL/min (ref >60)
Glucose, Bld: 198 mg/dL — ABNORMAL HIGH (ref 70–99)
Potassium: 4.3 mmol/L (ref 3.5–5.1)
Sodium: 128 mmol/L — ABNORMAL LOW (ref 135–145)
Total Bilirubin: 0.9 mg/dL (ref 0.3–1.2)
Total Protein: 6.7 g/dL (ref 6.5–8.1)

## 2019-06-01 LAB — BRAIN NATRIURETIC PEPTIDE: B Natriuretic Peptide: 97 pg/mL (ref 0.0–100.0)

## 2019-06-01 LAB — URINALYSIS, ROUTINE W REFLEX MICROSCOPIC
Bilirubin Urine: NEGATIVE
Glucose, UA: NEGATIVE mg/dL
Hgb urine dipstick: NEGATIVE
Ketones, ur: NEGATIVE mg/dL
Nitrite: POSITIVE — AB
Protein, ur: NEGATIVE mg/dL
Specific Gravity, Urine: 1.005 (ref 1.005–1.030)
pH: 6 (ref 5.0–8.0)

## 2019-06-01 LAB — MAGNESIUM: Magnesium: 1.7 mg/dL (ref 1.7–2.4)

## 2019-06-01 LAB — TROPONIN I (HIGH SENSITIVITY): Troponin I (High Sensitivity): 7 ng/L (ref <18)

## 2019-06-01 MED ORDER — CEPHALEXIN 500 MG PO CAPS: 500.0000 mg | ORAL_CAPSULE | Freq: Two times a day (BID) | ORAL | 0 refills | Status: AC

## 2019-06-01 NOTE — ED Notes (Signed)
Pt assisted x1 to toilet to urinate.

## 2019-06-01 NOTE — ED Provider Notes (Signed)
[facility] Emergency Department Provider Note  ____________________________________________   First MD Initiated Contact with Patient 06/01/19 1146     (approximate)  I have reviewed the triage vital signs and the nursing notes.   HISTORY  Chief Complaint Leg Swelling    HPI Karen [name] is a 74 y.o. female with diastolic heart failure, COPD on 3 L, diabetes, hypertension, hyperlipidemia, A. fib on Eliquis who comes in for lower extremity swelling x3 days.  On review of patient's cardiology records patient was taking 20 mg of Lasix for renal protection.  Patient states that she has been taking 1 pill daily.  However over the past few weeks she has noticed increasing swelling in her bilateral legs.  This is been constant, not better with the Lasix, nothing makes it worse.  Patient states that she is still having good urine output with her Lasix dosage.  She endorses maybe a little bit more shortness of breath at baseline but not too much worse than normal.  No chest pain, no falls, no abdominal pain, no fevers, no cough.          Past Medical History:  Diagnosis Date  . (HFpEF) heart failure with preserved ejection fraction ([place]) 2017   a. 2017 Echo: EF 50%; b. 06/2018 Echo: EF 50-55%; c. 08/2018 Echo: EF 50-55%, Nl RV fxn. RVSP 60.6mmHg. Mild BAE. Mild to mod TR.     Marland Kitchen Acute on chronic respiratory failure with hypoxia and hypercapnia ([place]) 01/07/2015  . Anemia   . 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 Asthma   . Cataract   . CKD (chronic kidney disease), stage III   . COPD (chronic obstructive pulmonary disease) ([place])    (1) 06/2018 tobacco use, home 3L oxygen   . Diabetes mellitus without complication (HCC)    (1) A1C 7.7 (06/2018)  . Edema, peripheral 04/20/2014  . History of kidney stones   . Hyperlipidemia   . Hypertension   . Iron deficiency anemia 06/22/2014  . Junctional bradycardia    a. In setting of beta blocker therapy.  . Leucocytosis 10/19/2015  .  Morbid obesity ([place])   . Overactive bladder   . Primary osteoarthritis of right knee 09/01/2016  . Sciatica 01/07/2015    Patient Active Problem List   Diagnosis Date Noted  . Pulmonary edema 02/27/2019  . Bradycardia 02/24/2019  . Chronic respiratory failure with hypoxia ([place]) 02/23/2019  . Atypical chest pain 02/13/2019  . AVM (arteriovenous malformation) of small bowel, acquired   . Acute gastric ulcer with hemorrhage   . Chronic diastolic heart failure ([place]) 08/22/2018  . Diarrhea 08/22/2018  . Junctional bradycardia   . Acute on chronic heart failure with preserved ejection fraction (HFpEF) ([place])   . AKI (acute kidney injury) ([place])   . Symptomatic bradycardia 08/05/2018  . Acute on chronic respiratory failure ([place]) 06/25/2018  . 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 diabetes mellitus ([place]) 01/25/2018  . History of non anemic vitamin B12 deficiency 01/25/2018  . Personal history of kidney stones 11/12/2017  . Urge incontinence 11/12/2017  . Right ureteral stone 09/15/2017  . Acute GI bleeding   . GI bleed 08/26/2017  . Arthritis 08/10/2017  . Chronic kidney disease 08/10/2017  . COPD (chronic obstructive pulmonary disease) ([place]) 08/10/2017  . Diabetes mellitus type 2, uncomplicated ([place]) 51/70/0174  . Hypertension 08/10/2017  . Obesity (BMI 35.0-39.9 without comorbidity) 04/11/2017  . Primary osteoarthritis of right knee 09/01/2016  . Leucocytosis 10/19/2015  . 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  Acute on chronic respiratory failure with hypoxia and hypercapnia ([place]) 01/07/2015  . Sciatica 01/07/2015  . Weakness 01/07/2015  . 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 Iron deficiency anemia 06/22/2014  . Microalbuminuria 06/22/2014  . CHF (congestive heart failure) ([place]) 04/20/2014  . Edema, peripheral 04/20/2014    Past Surgical History:  Procedure Laterality Date  . APPENDECTOMY    . CESAREAN SECTION     x3  . CHOLECYSTECTOMY    .  COLONOSCOPY WITH PROPOFOL N/A 08/28/2017   Procedure: COLONOSCOPY WITH PROPOFOL;  Surgeon: Lucilla Lame, MD;  Location: [facility] ENDOSCOPY;  Service: Endoscopy;  Laterality: N/A;  . COLONOSCOPY WITH PROPOFOL N/A 08/29/2017   Procedure: COLONOSCOPY WITH PROPOFOL;  Surgeon: Lucilla Lame, MD;  Location: [facility] ENDOSCOPY;  Service: Endoscopy;  Laterality: N/A;  . CYSTOSCOPY W/ URETERAL STENT PLACEMENT Right 09/15/2017   Procedure: CYSTOSCOPY WITH RETROGRADE PYELOGRAM/URETERAL STENT PLACEMENT;  Surgeon: Cleon Gustin, MD;  Location: [facility] ORS;  Service: Urology;  Laterality: Right;  . CYSTOSCOPY/URETEROSCOPY/HOLMIUM LASER/STENT PLACEMENT Right 10/09/2017   Procedure: CYSTOSCOPY/URETEROSCOPY/HOLMIUM LASER/STENT PLACEMENT;  Surgeon: Abbie Sons, MD;  Location: [facility] ORS;  Service: Urology;  Laterality: Right;  right Stent exchange  . ESOPHAGOGASTRODUODENOSCOPY (EGD) WITH PROPOFOL N/A 09/16/2018   Procedure: ESOPHAGOGASTRODUODENOSCOPY (EGD) WITH PROPOFOL;  Surgeon: Lin Landsman, MD;  Location: [place];  Service: Gastroenterology;  Laterality: N/A;  . EYE SURGERY      Prior to Admission medications   Medication Sig Start Date End Date Taking? Authorizing Provider  albuterol (VENTOLIN HFA) 108 (90 Base) MCG/ACT inhaler INHALE 2 PUFFS BY MOUTH EVERY 6 HOURS AS NEEDED FOR WHEEZING 01/02/19   [provider]  apixaban (ELIQUIS) 5 MG TABS tablet Take 1 tablet (5 mg total) by mouth 2 (two) times daily. 04/09/19   [name], MD  atorvastatin (LIPITOR) 10 MG tablet Take 1 tablet by mouth daily.    [provider]  esomeprazole (NEXIUM) 40 MG capsule Take 40 mg by mouth daily. 12/17/18   [provider]  Ferrous Sulfate (IRON) 325 (65 Fe) MG TABS Take 1 tablet by mouth daily. 09/14/17   [provider]  fluticasone (FLONASE) 50 MCG/ACT nasal spray Place 2 sprays into both nostrils daily. 11/15/18   [provider]  Fluticasone-Umeclidin-Vilant  100-62.5-25 MCG/INH AEPB Inhale 1 puff into the lungs daily. 12/25/17   [provider]  furosemide (LASIX) 20 MG tablet Take 20mg  (one tablet) daily. May take an additional 20mg  (one tablet) as needed for weight gain of 2lbs overnight or 5 lbs in 1 week. 04/21/19   Loel Dubonnet, NP  Insulin Human (INSULIN PUMP) SOLN Inject 1 each into the skin 3 times daily with meals, bedtime and 2 AM. Patient taking differently: Inject 1 each into the skin 3 times daily with meals, bedtime and 2 AM. Humalog Insulin 76 units daily 09/18/17   Dustin Flock, MD  ipratropium-albuterol (DUONEB) 0.5-2.5 (3) MG/3ML SOLN Inhale 3 mLs into the lungs 4 (four) times daily. 02/05/19   [provider]  losartan-hydrochlorothiazide (HYZAAR) 100-25 MG tablet Take 1 tablet by mouth daily. 01/05/19   [provider]  metFORMIN (GLUCOPHAGE) 500 MG tablet Take 1,000 mg by mouth 2 (two) times daily with a meal.     [provider]  montelukast (SINGULAIR) 10 MG tablet Take 10 mg by mouth at bedtime.    [provider]  potassium chloride (KLOR-CON) 10 MEQ tablet Take 1 tablet (10 mEq total) by mouth daily.  02/28/19   Nolberto Hanlon, MD  solifenacin (VESICARE) 10 MG tablet Take 1 tablet (10 mg total) by mouth daily. 07/23/18   Stoioff, Ronda Fairly, MD  vitamin B-12 (CYANOCOBALAMIN) 500 MCG tablet Take 500 mcg by mouth daily.    [provider]    Allergies Ace inhibitors, Beta adrenergic blockers, Gabapentin, Lisinopril, Lyrica [pregabalin], and Shrimp [shellfish allergy]  Family History  Problem Relation Age of Onset  . Other Mother        unknown medical history  . Other Father        unknown medical history    Social History Social History   Tobacco Use  . Smoking status: Former Smoker    Packs/day: 1.00    Years: 20.00    Pack years: 20.00    Quit date: 12/04/1992    Years since quitting: 26.5  . Smokeless tobacco: Never Used  Substance Use Topics  . Alcohol use:  No  . Drug use: No      Review of Systems Constitutional: No fever/chills Eyes: No visual changes. ENT: No sore throat. Cardiovascular: Denies chest pain. Respiratory: Mild shortness of breath Gastrointestinal: No abdominal pain.  No nausea, no vomiting.  No diarrhea.  No constipation. Genitourinary: Negative for dysuria. Musculoskeletal: Negative for back pain.  Leg swelling Skin: Negative for rash. Neurological: Negative for headaches, focal weakness or numbness. All other ROS negative ____________________________________________   PHYSICAL EXAM:  VITAL SIGNS: ED Triage Vitals  Enc Vitals Group     BP 06/01/19 1134 129/68     Pulse Rate 06/01/19 1134 80     Resp 06/01/19 1134 18     Temp 06/01/19 1134 98.3 F (36.8 C)     Temp Source 06/01/19 1134 Oral     SpO2 06/01/19 1134 96 %     Weight 06/01/19 1135 220 lb (99.8 kg)     Height 06/01/19 1135 5\' 3"  (1.6 m)     Head Circumference --      Peak Flow --      Pain Score --      Pain Loc --      Pain Edu? --      Excl. in [place]? --     Constitutional: Alert and oriented. Well appearing and in no acute distress. Eyes: Conjunctivae are normal. EOMI. Head: Atraumatic. Nose: No congestion/rhinnorhea. Mouth/Throat: Mucous membranes are moist.   Neck: No stridor. Trachea Midline. FROM Cardiovascular: Normal rate, regular rhythm. Grossly normal heart sounds.  Good peripheral circulation. Respiratory: Normal respiratory effort.  No retractions. Lungs CTAB.  On her 3 L oxygen Gastrointestinal: Soft and nontender. No distention. No abdominal bruits.  Musculoskeletal: Good distal pulses bilaterally, 1+ edema bilaterally, little bit of redness on the right lower leg with mild warmth Neurologic:  Normal speech and language. No gross focal neurologic deficits are appreciated.  Skin:  Skin is warm, dry and intact. No rash noted. Psychiatric: Mood and affect are normal. Speech and behavior are normal. GU: Deferred    ____________________________________________   LABS (all labs ordered are listed, but only abnormal results are displayed)  Labs Reviewed  CBC WITH DIFFERENTIAL/PLATELET - Abnormal; Notable for the following components:      Result Value   WBC 12.5 (*)    RBC 3.78 (*)    Hemoglobin 10.9 (*)    HCT 34.2 (*)    Neutro Abs 9.5 (*)    All other components within normal limits  COMPREHENSIVE METABOLIC PANEL - Abnormal; Notable for the following  components:   Sodium 128 (*)    Chloride 89 (*)    Glucose, Bld 198 (*)    All other components within normal limits  URINALYSIS, ROUTINE W REFLEX MICROSCOPIC - Abnormal; Notable for the following components:   Color, Urine YELLOW (*)    APPearance HAZY (*)    Nitrite POSITIVE (*)    Leukocytes,Ua MODERATE (*)    Bacteria, UA MANY (*)    All other components within normal limits  BRAIN NATRIURETIC PEPTIDE  MAGNESIUM  TROPONIN I (HIGH SENSITIVITY)  TROPONIN I (HIGH SENSITIVITY)   ____________________________________________   ED ECG REPORT I, Vanessa [place], the attending physician, personally viewed and interpreted this ECG.  EKG normal sinus rate of 74 no ST elevation, no T wave version, normal intervals ____________________________________________  RADIOLOGY Robert Bellow, personally viewed and evaluated these images (plain radiographs) as part of my medical decision making, as well as reviewing the written report by the radiologist.  ED MD interpretation: Cardiomegaly with some mild infiltrates bilaterally  Official radiology report(s): DG Chest Portable 1 View  Result Date: 06/01/2019 CLINICAL DATA:  Shortness of breath EXAM: PORTABLE CHEST 1 VIEW COMPARISON:  Chest radiograph dated 02/23/2019 FINDINGS: The heart remains enlarged. Vascular calcifications are seen in the aortic arch. Mild bilateral interstitial opacities are noted. There is mild left basilar atelectasis. There is no pleural effusion or pneumothorax. The  osseous structures are intact. IMPRESSION: Cardiomegaly with mild bilateral interstitial opacities. Findings may represent pulmonary edema or atypical infection. Electronically Signed   By: Zerita Boers M.D.   On: 06/01/2019 12:45    ____________________________________________   PROCEDURES  Procedure(s) performed (including Critical Care):  Procedures   ____________________________________________   INITIAL IMPRESSION / ASSESSMENT AND PLAN / ED COURSE  KEESHA PELLUM was evaluated in Emergency Department on 06/01/2019 for the symptoms described in the history of present illness. She was evaluated in the context of the global COVID-19 pandemic, which necessitated consideration that the patient might be at risk for infection with the SARS-CoV-2 virus that causes COVID-19. Institutional protocols and algorithms that pertain to the evaluation of patients at risk for COVID-19 are in a state of rapid change based on information released by regulatory bodies including the CDC and federal and state organizations. These policies and algorithms were followed during the patient's care in the ED.     Patient is a well-appearing 74 year old with normal vital signs on her baseline 3 L oxygen who comes in with worsening leg swelling.  Sound like patient still having good urine output on her 20 mg.  She may need to be increased to twice a day but will get labs to make sure her electrolytes look good no evidence of AKI.  Patient does feel little short of breath so we will get a chest x-ray to make sure no large pleural effusions although she is on her baseline oxygen.  Will get troponin and EKG to make sure no signs of a heart attack given the shortness of breath although she has no chest pain I have low suspicion.  Patient does have a little bit of redness on her right leg and could be starting a cellulitis.  Patient sodium and chloride are slightly low but I suspect that this is more related to fluid  overload given her examination. I think going up on the Lasix will help these numbers although I have talked to patient about having repeat labs done in 3 days. Her chest x-ray is concerning for  mild opacification concerning for pulmonary edema versus infection. Patient denies any fever or cough so I have low suspicion for infection. Will send Covid swab just to make sure. Patient is on her baseline 3 L oxygen and denies feeling really short of breath with walking around. Given the leg swelling and the x-ray concerning for some pulmonary edema will give additional 20 mg of Lasix in the afternoons for the next 3 days. Looks like patient was previously on Lasix 40/suspect that this will help patient's swelling. Offered to place an IV and given IV dose versus just doubling up on her Lasix today and she preferred just to double up and avoid the IV. For the right leg redness will trial some Keflex and this would also treat her possible UTI although she denied any symptoms. Patient is not febrile and has no other sirs criteria other than her elevated white count. Patient looks really good and is tolerating eating and would like to be able to go home  I discussed the provisional nature of ED diagnosis, the treatment so far, the ongoing plan of care, follow up appointments and return precautions with the patient and any family or support people present. They expressed understanding and agreed with the plan, discharged home.     ____________________________________________   FINAL CLINICAL IMPRESSION(S) / ED DIAGNOSES   Final diagnoses:  Peripheral edema  Hyponatremia  Cellulitis of right lower extremity      MEDICATIONS GIVEN DURING THIS VISIT:  Medications - No data to display   ED Discharge Orders         Ordered    cephALEXin (KEFLEX) 500 MG capsule  2 times daily     06/01/19 1522           Note:  This document was prepared using Dragon voice recognition software and may include  unintentional dictation errors.   Vanessa [place], MD 06/01/19 1524

## 2019-06-01 NOTE — ED Triage Notes (Signed)
Pt arrived via POV with reports of increased swelling to lower extremities x 3 days, pt was told to come to ED Friday, but didn't.  Pt reports 4lb weight gain in 3 days, has hx of HF and COPD. Wears 3L [place] continuous.

## 2019-06-01 NOTE — ED Notes (Signed)
ED Provider at bedside. 

## 2019-06-01 NOTE — Discharge Instructions (Addendum)
Your chest x-ray was concerning for possible infection versus a little bit of extra fluid on you.  I suspect is more likely fluid.  We are going to send a Covid swab just to make sure.  Stay quarantined for the next 24 hours.  You should take your Lasix 20 mg once in the morning and once at night for the next 3 days.  Your sodium and chloride levels were a little bit low but I suspect this is from the extra fluid on you.  You should follow-up with your cardiologist or primary care doctor by Wednesday of next week to have these labs rechecked.  If you develop worsening shortness of breath you should return to the ER immediately.  We are also starting you on a course of antibiotics for your right leg and for possible UTI.  Elevated your leg, use compression socks.

## 2019-06-01 NOTE — ED Notes (Signed)
X-ray at bedside

## 2019-06-01 NOTE — ED Notes (Signed)
Pt ambulatory to toilet with 1 assist to urinate.

## 2019-06-02 LAB — SARS CORONAVIRUS 2 (TAT 6-24 HRS): SARS Coronavirus 2: NEGATIVE

## 2019-06-03 LAB — URINE CULTURE: Culture: >=100000 — AB

## 2019-06-04 ENCOUNTER — Ambulatory Visit: Payer: Medicare Other | Admitting: Family

## 2019-06-04 ENCOUNTER — Other Ambulatory Visit: Payer: Self-pay | Admitting: Family

## 2019-06-04 ENCOUNTER — Ambulatory Visit
Admission: RE | Admit: 2019-06-04 | Discharge: 2019-06-04 | Disposition: A | Discharge status: Home / Self Care | Payer: Medicare Other | Source: Ambulatory Visit | Attending: Family | Admitting: Family

## 2019-06-04 ENCOUNTER — Other Ambulatory Visit: Payer: Self-pay

## 2019-06-04 ENCOUNTER — Encounter: Payer: Self-pay | Admitting: Family

## 2019-06-04 VITALS — BP 163/65 | HR 81 | Resp 16 | Ht 63.0 in | Wt 222.4 lb

## 2019-06-04 DIAGNOSIS — Z91013 Allergy to seafood: Secondary | ICD-10-CM | POA: Insufficient documentation

## 2019-06-04 DIAGNOSIS — Z888 Allergy status to other drugs, medicaments and biological substances status: Secondary | ICD-10-CM | POA: Diagnosis not present

## 2019-06-04 DIAGNOSIS — Z7951 Long term (current) use of inhaled steroids: Secondary | ICD-10-CM | POA: Diagnosis not present

## 2019-06-04 DIAGNOSIS — Z87442 Personal history of urinary calculi: Secondary | ICD-10-CM | POA: Insufficient documentation

## 2019-06-04 DIAGNOSIS — J449 Chronic obstructive pulmonary disease, unspecified: Secondary | ICD-10-CM | POA: Diagnosis not present

## 2019-06-04 DIAGNOSIS — Z9981 Dependence on supplemental oxygen: Secondary | ICD-10-CM | POA: Diagnosis not present

## 2019-06-04 DIAGNOSIS — I130 Hypertensive heart and chronic kidney disease with heart failure and stage 1 through stage 4 chronic kidney disease, or unspecified chronic kidney disease: Secondary | ICD-10-CM | POA: Diagnosis not present

## 2019-06-04 DIAGNOSIS — Z6839 Body mass index (BMI) 39.0-39.9, adult: Secondary | ICD-10-CM | POA: Insufficient documentation

## 2019-06-04 DIAGNOSIS — I5033 Acute on chronic diastolic (congestive) heart failure: Secondary | ICD-10-CM

## 2019-06-04 DIAGNOSIS — Z87891 Personal history of nicotine dependence: Secondary | ICD-10-CM | POA: Diagnosis not present

## 2019-06-04 DIAGNOSIS — I10 Essential (primary) hypertension: Secondary | ICD-10-CM

## 2019-06-04 DIAGNOSIS — D509 Iron deficiency anemia, unspecified: Secondary | ICD-10-CM | POA: Insufficient documentation

## 2019-06-04 DIAGNOSIS — Z9641 Presence of insulin pump (external) (internal): Secondary | ICD-10-CM | POA: Insufficient documentation

## 2019-06-04 DIAGNOSIS — Z794 Long term (current) use of insulin: Secondary | ICD-10-CM | POA: Diagnosis not present

## 2019-06-04 DIAGNOSIS — Z79899 Other long term (current) drug therapy: Secondary | ICD-10-CM | POA: Diagnosis not present

## 2019-06-04 DIAGNOSIS — Z7901 Long term (current) use of anticoagulants: Secondary | ICD-10-CM | POA: Insufficient documentation

## 2019-06-04 DIAGNOSIS — E785 Hyperlipidemia, unspecified: Secondary | ICD-10-CM | POA: Insufficient documentation

## 2019-06-04 DIAGNOSIS — N183 Chronic kidney disease, stage 3 unspecified: Secondary | ICD-10-CM | POA: Diagnosis not present

## 2019-06-04 DIAGNOSIS — E119 Type 2 diabetes mellitus without complications: Secondary | ICD-10-CM

## 2019-06-04 DIAGNOSIS — E1122 Type 2 diabetes mellitus with diabetic chronic kidney disease: Secondary | ICD-10-CM | POA: Diagnosis not present

## 2019-06-04 LAB — BASIC METABOLIC PANEL
Anion gap: 11 (ref 5–15)
BUN: 17 mg/dL (ref 8–23)
CO2: 30 mmol/L (ref 22–32)
Calcium: 9.1 mg/dL (ref 8.9–10.3)
Chloride: 88 mmol/L — ABNORMAL LOW (ref 98–111)
Creatinine, Ser: 0.91 mg/dL (ref 0.44–1.00)
GFR calc Af Amer: >60 mL/min (ref >60)
GFR calc non Af Amer: >60 mL/min (ref >60)
Glucose, Bld: 121 mg/dL — ABNORMAL HIGH (ref 70–99)
Potassium: 3.6 mmol/L (ref 3.5–5.1)
Sodium: 129 mmol/L — ABNORMAL LOW (ref 135–145)

## 2019-06-04 LAB — BRAIN NATRIURETIC PEPTIDE: B Natriuretic Peptide: 45 pg/mL (ref 0.0–100.0)

## 2019-06-04 MED ORDER — SODIUM CHLORIDE FLUSH 0.9 % IV SOLN
INTRAVENOUS | Status: AC
  Filled 2019-06-04: qty 10

## 2019-06-04 MED ORDER — FUROSEMIDE 10 MG/ML IJ SOLN: 80.0000 mg | Freq: Once | INTRAMUSCULAR | Status: AC

## 2019-06-04 MED ORDER — POTASSIUM CHLORIDE CRYS ER 20 MEQ PO TBCR: 40.0000 meq | EXTENDED_RELEASE_TABLET | Freq: Once | ORAL | Status: AC

## 2019-06-04 MED ORDER — POTASSIUM CHLORIDE CRYS ER 20 MEQ PO TBCR
EXTENDED_RELEASE_TABLET | ORAL | Status: AC
  Administered 2019-06-04: 40 meq via ORAL
  Filled 2019-06-04: qty 2

## 2019-06-04 MED ORDER — FUROSEMIDE 10 MG/ML IJ SOLN
INTRAMUSCULAR | Status: AC
  Administered 2019-06-04: 80 mg via INTRAVENOUS
  Filled 2019-06-04: qty 8

## 2019-06-04 NOTE — Patient Instructions (Signed)
Continue weighing daily and call for an overnight weight gain of > 2 pounds or a weekly weight gain of >5 pounds. 

## 2019-06-04 NOTE — Progress Notes (Signed)
Patient ID: Karen [name], female    DOB: 07-24-1945, 74 y.o.   MRN: [id number]  HPI  Karen [name] is a 74 y/o female with a history of HTN, CKD, DM, asthma, hyperlipidemia, COPD, anemia and chronic heart failure.    Echo report from 02/14/2019 reviewed and showed an EF of 65-70% without LVH along with trivial MR/TR and moderately elevated PA pressure.   Was in the ED 06/01/19 due to peripheral edema. Lasix doubled and given antibiotics for leg cellulitis. Admitted twice December 2020.   She presents today for a follow-up visit with a chief complaint of moderate fatigue upon minimal exertion. She describes this as chronic in nature having been present for several years. She has associated pedal edema along with this. She denies any difficulty sleeping, abdominal distention, palpitations, chest pain, shortness of breath, cough, dizziness or weight gain.   Past Medical History:  Diagnosis Date  . (HFpEF) heart failure with preserved ejection fraction (Karen [name]) 2017   a. 2017 Echo: EF 50%; b. 06/2018 Echo: EF 50-55%; c. 08/2018 Echo: EF 50-55%, Nl RV fxn. RVSP 60.65mmHg. Mild BAE. Mild to mod TR.     Marland Kitchen Acute on chronic respiratory failure with hypoxia and hypercapnia (Karen [name]) 01/07/2015  . Anemia   . 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 Asthma   . Cataract   . CHF (congestive heart failure) (Karen [name])   . CKD (chronic kidney disease), stage III   . COPD (chronic obstructive pulmonary disease) ([place])    (1) 06/2018 tobacco use, home 3L oxygen   . Diabetes mellitus without complication (HCC)    (1) A1C 7.7 (06/2018)  . Edema, peripheral 04/20/2014  . History of kidney stones   . Hyperlipidemia   . Hypertension   . Iron deficiency anemia 06/22/2014  . Junctional bradycardia    a. In setting of beta blocker therapy.  . Leucocytosis 10/19/2015  . Morbid obesity ([place])   . Overactive bladder   . Primary osteoarthritis of right knee 09/01/2016  . Sciatica 01/07/2015   Past Surgical History:  Procedure Laterality Date  .  APPENDECTOMY    . CESAREAN SECTION     x3  . CHOLECYSTECTOMY    . COLONOSCOPY WITH PROPOFOL N/A 08/28/2017   Procedure: COLONOSCOPY WITH PROPOFOL;  Surgeon: Karen Lame, MD;  Location: [facility] ENDOSCOPY;  Service: Endoscopy;  Laterality: N/A;  . COLONOSCOPY WITH PROPOFOL N/A 08/29/2017   Procedure: COLONOSCOPY WITH PROPOFOL;  Surgeon: Karen Lame, MD;  Location: [facility] ENDOSCOPY;  Service: Endoscopy;  Laterality: N/A;  . CYSTOSCOPY W/ URETERAL STENT PLACEMENT Right 09/15/2017   Procedure: CYSTOSCOPY WITH RETROGRADE PYELOGRAM/URETERAL STENT PLACEMENT;  Surgeon: Karen Gustin, MD;  Location: [facility] ORS;  Service: Urology;  Laterality: Right;  . CYSTOSCOPY/URETEROSCOPY/HOLMIUM LASER/STENT PLACEMENT Right 10/09/2017   Procedure: CYSTOSCOPY/URETEROSCOPY/HOLMIUM LASER/STENT PLACEMENT;  Surgeon: Karen Sons, MD;  Location: [facility] ORS;  Service: Urology;  Laterality: Right;  right Stent exchange  . ESOPHAGOGASTRODUODENOSCOPY (EGD) WITH PROPOFOL N/A 09/16/2018   Procedure: ESOPHAGOGASTRODUODENOSCOPY (EGD) WITH PROPOFOL;  Surgeon: Karen Landsman, MD;  Location: [place];  Service: Gastroenterology;  Laterality: N/A;  . EYE SURGERY     Family History  Problem Relation Age of Onset  . Other Mother        unknown medical history  . Other Father        unknown medical history   Social History   Tobacco Use  . Smoking status: Former Smoker    Packs/day: 1.00    Years: 20.00    Pack  years: 20.00    Quit date: 12/04/1992    Years since quitting: 26.5  . Smokeless tobacco: Never Used  Substance Use Topics  . Alcohol use: No   Allergies  Allergen Reactions  . Ace Inhibitors Hives  . Beta Adrenergic Blockers     Junctional bradycardia  . Gabapentin Hives  . Lisinopril Hives  . Lyrica [Pregabalin] Hives  . Shrimp [Shellfish Allergy] Swelling    Swelling of the lips   Prior to Admission medications   Medication Sig Start Date End Date Taking? Authorizing Provider  albuterol  (VENTOLIN HFA) 108 (90 Base) MCG/ACT inhaler INHALE 2 PUFFS BY MOUTH EVERY 6 HOURS AS NEEDED FOR WHEEZING 01/02/19  Yes [provider]  apixaban (ELIQUIS) 5 MG TABS tablet Take 1 tablet (5 mg total) by mouth 2 (two) times daily. 04/09/19  Yes Karen Merritts, MD  atorvastatin (LIPITOR) 10 MG tablet Take 1 tablet by mouth daily.   Yes [provider]  cephALEXin (KEFLEX) 500 MG capsule Take 1 capsule (500 mg total) by mouth 2 (two) times daily for 5 days. 06/01/19 06/06/19 Yes Karen Denhoff, MD  esomeprazole (NEXIUM) 40 MG capsule Take 40 mg by mouth daily. 12/17/18  Yes [provider]  Ferrous Sulfate (IRON) 325 (65 Fe) MG TABS Take 1 tablet by mouth daily. 09/14/17  Yes [provider]  fluticasone (FLONASE) 50 MCG/ACT nasal spray Place 2 sprays into both nostrils daily. 11/15/18  Yes [provider]  Fluticasone-Umeclidin-Vilant 100-62.5-25 MCG/INH AEPB Inhale 1 puff into the lungs daily. 12/25/17  Yes [provider]  furosemide (LASIX) 20 MG tablet Take 20mg  (one tablet) daily. May take an additional 20mg  (one tablet) as needed for weight gain of 2lbs overnight or 5 lbs in 1 week. 04/21/19  Yes Karen Dubonnet, NP  Insulin Human (INSULIN PUMP) SOLN Inject 1 each into the skin 3 times daily with meals, bedtime and 2 AM. Patient taking differently: Inject 1 each into the skin 3 times daily with meals, bedtime and 2 AM. Humalog Insulin 76 units daily 09/18/17  Yes Karen Flock, MD  ipratropium-albuterol (DUONEB) 0.5-2.5 (3) MG/3ML SOLN Inhale 3 mLs into the lungs 4 (four) times daily. 02/05/19  Yes [provider]  losartan-hydrochlorothiazide (HYZAAR) 100-25 MG tablet Take 1 tablet by mouth daily. 01/05/19  Yes [provider]  metFORMIN (GLUCOPHAGE) 500 MG tablet Take 1,000 mg by mouth 2 (two) times daily with a meal.    Yes [provider]  montelukast (SINGULAIR) 10 MG tablet Take 10 mg by mouth at bedtime.   Yes  [provider]  potassium chloride (KLOR-CON) 10 MEQ tablet Take 1 tablet (10 mEq total) by mouth daily. 02/28/19  Yes Karen Hanlon, MD  solifenacin (VESICARE) 10 MG tablet Take 1 tablet (10 mg total) by mouth daily. 07/23/18  Yes Stoioff, Ronda Fairly, MD  vitamin B-12 (CYANOCOBALAMIN) 500 MCG tablet Take 500 mcg by mouth daily.   Yes [provider]     Review of Systems  Constitutional: Positive for fatigue. Negative for appetite change.  HENT: Negative for congestion, postnasal drip and sore throat.   Eyes: Negative.   Respiratory: Negative for cough and shortness of breath.   Cardiovascular: Positive for leg swelling. Negative for chest pain and palpitations.  Gastrointestinal: Negative for abdominal distention and abdominal pain.  Endocrine: Negative.   Genitourinary: Negative.   Musculoskeletal: Negative for back pain and neck pain.  Allergic/Immunologic: Negative.   Neurological: Negative for dizziness and light-headedness.  Hematological: Negative for adenopathy. Does not bruise/bleed easily.  Psychiatric/Behavioral: Negative for dysphoric mood and sleep disturbance (sleeping on 2 pillows). The patient is not nervous/anxious.    Vitals:   06/04/19 1412  BP: (!) 163/65  Pulse: 81  Resp: 16  SpO2: 96%  Weight: 222 lb 6.4 oz (100.9 kg)  Height: 5\' 3"  (1.6 m)   Wt Readings from Last 3 Encounters:  06/04/19 222 lb 6.4 oz (100.9 kg)  06/01/19 220 lb (99.8 kg)  04/16/19 218 lb 8 oz (99.1 kg)   Lab Results  Component Value Date   CREATININE 0.79 06/01/2019   CREATININE 0.98 02/27/2019   CREATININE 1.24 (H) 02/26/2019     Physical Exam Vitals and nursing note reviewed.  Constitutional:      Appearance: Normal appearance.  HENT:     Head: Normocephalic and atraumatic.  Cardiovascular:     Rate and Rhythm: Normal rate and regular rhythm.  Pulmonary:     Effort: Pulmonary effort is normal. No respiratory distress.     Breath sounds: No wheezing or  rales.  Abdominal:     General: There is no distension.     Palpations: Abdomen is soft.  Musculoskeletal:        General: No tenderness.     Cervical back: Normal range of motion.     Right lower leg: Edema (1+ pitting) present.     Left lower leg: Edema (1+ pitting) present.  Skin:    General: Skin is warm and dry.  Neurological:     General: No focal deficit present.     Mental Status: She is alert and oriented to person, place, and time.  Psychiatric:        Mood and Affect: Mood normal.        Behavior: Behavior normal.     Assessment & Plan:  1: Acute on Chronic heart failure with preserved ejection fraction- - NYHA class III - fluid overloaded today with pedal edema and worsening symptoms - weighing daily and says that she has lost 2 pounds in the last 3 days; reminded to call for an overnight weight gain of >2 pounds or a weekly weight gain of >5 pounds - not adding salt to her food - discussed giving IV lasix today (this was recommended at ED visit 3 days ago) or changing her diuretic. She would like to get the IV lasix today so she was sent for 80mg  IV lasix/ 67meq PO potassium today - BMP/BNP to also be drawn - may need to change diuretic to torsemide - saw cardiology Gilford Rile) 04/16/19 - participating in paramedicine program - BNP 06/01/19 was 97.0  2: HTN- - BP elevated initially (163/65); giving IV lasix per above - saw PCP Dema Severin) 01/23/2019 - BMP 06/01/19 reviewed and showed sodium 128, potassium 4.3, creatinine 0.79 and GFR >60  3: DM- - saw endocrinology Honor Junes) 03/18/19 - A1c 02/24/2019 was 8.4% - glucose at home this morning was 140  4: COPD- - saw pulmonology Raul Del) 03/19/19 - wearing oxygen at 3L around the clock   Patient did not bring her medications nor a list. Each medication was verbally reviewed with the patient and she was encouraged to bring the bottles to every visit to confirm accuracy of list.  Return in 5 days as patient's husband  is having a procedure done tomorrow and she doesn't know if he'll be up for driving the day after that.

## 2019-06-09 ENCOUNTER — Telehealth (HOSPITAL_COMMUNITY): Payer: Self-pay

## 2019-06-09 ENCOUNTER — Encounter: Payer: Self-pay | Admitting: Family

## 2019-06-09 ENCOUNTER — Other Ambulatory Visit: Payer: Self-pay

## 2019-06-09 ENCOUNTER — Ambulatory Visit: Payer: Medicare Other | Attending: Family | Admitting: Family

## 2019-06-09 VITALS — BP 166/61 | HR 84 | Resp 18 | Ht 63.0 in | Wt 219.2 lb

## 2019-06-09 DIAGNOSIS — E119 Type 2 diabetes mellitus without complications: Secondary | ICD-10-CM

## 2019-06-09 DIAGNOSIS — J449 Chronic obstructive pulmonary disease, unspecified: Secondary | ICD-10-CM

## 2019-06-09 DIAGNOSIS — I10 Essential (primary) hypertension: Secondary | ICD-10-CM

## 2019-06-09 DIAGNOSIS — I5032 Chronic diastolic (congestive) heart failure: Secondary | ICD-10-CM | POA: Diagnosis not present

## 2019-06-09 LAB — BASIC METABOLIC PANEL
Anion gap: 10 (ref 5–15)
BUN: 23 mg/dL (ref 8–23)
CO2: 29 mmol/L (ref 22–32)
Calcium: 9.2 mg/dL (ref 8.9–10.3)
Chloride: 95 mmol/L — ABNORMAL LOW (ref 98–111)
Creatinine, Ser: 0.76 mg/dL (ref 0.44–1.00)
GFR calc Af Amer: >60 mL/min (ref >60)
GFR calc non Af Amer: >60 mL/min (ref >60)
Glucose, Bld: 212 mg/dL — ABNORMAL HIGH (ref 70–99)
Potassium: 3.6 mmol/L (ref 3.5–5.1)
Sodium: 134 mmol/L — ABNORMAL LOW (ref 135–145)

## 2019-06-09 NOTE — Patient Instructions (Addendum)
Continue weighing daily and call for an overnight weight gain of > 2 pounds or a weekly weight gain of >5 pounds.  Increase your furosemide to 2 tablets every morning.

## 2019-06-09 NOTE — Telephone Encounter (Signed)
Today Iyonna contacted me and asking if she will be getting more Moms meals.  Spoke with Tammy Sours and she advised she will get a second shipment the end of the week according to the dates.  Alvina Chou.  Cali is also aware of HF clinic appt today and she states the IV lasix did help and her weight did come down.    [place] [phone]

## 2019-06-09 NOTE — Progress Notes (Signed)
Patient ID: Karen [name], female    DOB: Jul 13, 1945, 74 y.o.   MRN: [id number]  HPI  Karen [name] is a 74 y/o female with a history of HTN, CKD, DM, asthma, hyperlipidemia, COPD, anemia and chronic heart failure.    Echo report from 02/14/2019 reviewed and showed an EF of 65-70% without LVH along with trivial MR/TR and moderately elevated PA pressure.   Was in the ED 06/01/19 due to peripheral edema. Lasix doubled and given antibiotics for leg cellulitis. Admitted twice December 2020.   Karen [name] presents today for a follow-up visit with a chief complaint of minimal fatigue upon moderate exertion. Karen [name] describes this as chronic in nature having been present for several years. Karen [name] has associated pedal edema along with this. Karen [name] denies any difficulty sleeping, abdominal distention, palpitations, chest pain, shortness of breath, cough, dizziness or weight gain.   Says that Karen [name] feels like Karen [name] symptoms improved after receiving IV lasix last week.   Past Medical History:  Diagnosis Date  . (HFpEF) heart failure with preserved ejection fraction ([place]) 2017   a. 2017 Echo: EF 50%; b. 06/2018 Echo: EF 50-55%; c. 08/2018 Echo: EF 50-55%, Nl RV fxn. RVSP 60.81mmHg. Mild BAE. Mild to mod TR.     Marland Kitchen Acute on chronic respiratory failure with hypoxia and hypercapnia ([place]) 01/07/2015  . Anemia   . 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 Asthma   . Cataract   . CHF (congestive heart failure) ([place])   . CKD (chronic kidney disease), stage III   . COPD (chronic obstructive pulmonary disease) ([place])    (1) 06/2018 tobacco use, home 3L oxygen   . Diabetes mellitus without complication (HCC)    (1) A1C 7.7 (06/2018)  . Edema, peripheral 04/20/2014  . History of kidney stones   . Hyperlipidemia   . Hypertension   . Iron deficiency anemia 06/22/2014  . Junctional bradycardia    a. In setting of beta blocker therapy.  . Leucocytosis 10/19/2015  . Morbid obesity ([place])   . Overactive bladder   . Primary osteoarthritis of right knee  09/01/2016  . Sciatica 01/07/2015   Past Surgical History:  Procedure Laterality Date  . APPENDECTOMY    . CESAREAN SECTION     x3  . CHOLECYSTECTOMY    . COLONOSCOPY WITH PROPOFOL N/A 08/28/2017   Procedure: COLONOSCOPY WITH PROPOFOL;  Surgeon: Lucilla Lame, MD;  Location: [facility] ENDOSCOPY;  Service: Endoscopy;  Laterality: N/A;  . COLONOSCOPY WITH PROPOFOL N/A 08/29/2017   Procedure: COLONOSCOPY WITH PROPOFOL;  Surgeon: Lucilla Lame, MD;  Location: [facility] ENDOSCOPY;  Service: Endoscopy;  Laterality: N/A;  . CYSTOSCOPY W/ URETERAL STENT PLACEMENT Right 09/15/2017   Procedure: CYSTOSCOPY WITH RETROGRADE PYELOGRAM/URETERAL STENT PLACEMENT;  Surgeon: Cleon Gustin, MD;  Location: [facility] ORS;  Service: Urology;  Laterality: Right;  . CYSTOSCOPY/URETEROSCOPY/HOLMIUM LASER/STENT PLACEMENT Right 10/09/2017   Procedure: CYSTOSCOPY/URETEROSCOPY/HOLMIUM LASER/STENT PLACEMENT;  Surgeon: Abbie Sons, MD;  Location: [facility] ORS;  Service: Urology;  Laterality: Right;  right Stent exchange  . ESOPHAGOGASTRODUODENOSCOPY (EGD) WITH PROPOFOL N/A 09/16/2018   Procedure: ESOPHAGOGASTRODUODENOSCOPY (EGD) WITH PROPOFOL;  Surgeon: Lin Landsman, MD;  Location: [facility];  Service: Gastroenterology;  Laterality: N/A;  . EYE SURGERY     Family History  Problem Relation Age of Onset  . Other Mother        unknown medical history  . Other Father        unknown medical history   Social History   Tobacco Use  . Smoking status:  Former Smoker    Packs/day: 1.00    Years: 20.00    Pack years: 20.00    Quit date: 12/04/1992    Years since quitting: 26.5  . Smokeless tobacco: Never Used  Substance Use Topics  . Alcohol use: No   Allergies  Allergen Reactions  . Ace Inhibitors Hives  . Beta Adrenergic Blockers     Junctional bradycardia  . Gabapentin Hives  . Lisinopril Hives  . Lyrica [Pregabalin] Hives  . Shrimp [Shellfish Allergy] Swelling    Swelling of the lips   Prior to Admission  medications   Medication Sig Start Date End Date Taking? Authorizing Provider  albuterol (VENTOLIN HFA) 108 (90 Base) MCG/ACT inhaler INHALE 2 PUFFS BY MOUTH EVERY 6 HOURS AS NEEDED FOR WHEEZING 01/02/19  Yes [provider]  apixaban (ELIQUIS) 5 MG TABS tablet Take 1 tablet (5 mg total) by mouth 2 (two) times daily. 04/09/19  Yes [name], MD  atorvastatin (LIPITOR) 10 MG tablet Take 1 tablet by mouth daily.   Yes [provider]  esomeprazole (NEXIUM) 40 MG capsule Take 40 mg by mouth daily. 12/17/18  Yes [provider]  Ferrous Sulfate (IRON) 325 (65 Fe) MG TABS Take 1 tablet by mouth daily. 09/14/17  Yes [provider]  fluticasone (FLONASE) 50 MCG/ACT nasal spray Place 2 sprays into both nostrils daily. 11/15/18  Yes [provider]  Fluticasone-Umeclidin-Vilant 100-62.5-25 MCG/INH AEPB Inhale 1 puff into the lungs daily. 12/25/17  Yes [provider]  furosemide (LASIX) 20 MG tablet Take 20mg  (one tablet) daily. May take an additional 20mg  (one tablet) as needed for weight gain of 2lbs overnight or 5 lbs in 1 week. 04/21/19  Yes Loel Dubonnet, NP  Insulin Human (INSULIN PUMP) SOLN Inject 1 each into the skin 3 times daily with meals, bedtime and 2 AM. Patient taking differently: Inject 1 each into the skin 3 times daily with meals, bedtime and 2 AM. Humalog Insulin 76 units daily 09/18/17  Yes Dustin Flock, MD  ipratropium-albuterol (DUONEB) 0.5-2.5 (3) MG/3ML SOLN Inhale 3 mLs into the lungs 4 (four) times daily. 02/05/19  Yes [provider]  losartan-hydrochlorothiazide (HYZAAR) 100-25 MG tablet Take 1 tablet by mouth daily. 01/05/19  Yes [provider]  metFORMIN (GLUCOPHAGE) 500 MG tablet Take 1,000 mg by mouth 2 (two) times daily with a meal.    Yes [provider]  montelukast (SINGULAIR) 10 MG tablet Take 10 mg by mouth at bedtime.   Yes [provider]  potassium chloride (KLOR-CON) 10  MEQ tablet Take 1 tablet (10 mEq total) by mouth daily. 02/28/19  Yes Nolberto Hanlon, MD  solifenacin (VESICARE) 10 MG tablet Take 1 tablet (10 mg total) by mouth daily. 07/23/18  Yes Stoioff, Ronda Fairly, MD  vitamin B-12 (CYANOCOBALAMIN) 500 MCG tablet Take 500 mcg by mouth daily.   Yes [provider]    Review of Systems  Constitutional: Positive for fatigue. Negative for appetite change.  HENT: Negative for congestion, postnasal drip and sore throat.   Eyes: Negative.   Respiratory: Negative for cough and shortness of breath.   Cardiovascular: Positive for leg swelling. Negative for chest pain and palpitations.  Gastrointestinal: Negative for abdominal distention and abdominal pain.  Endocrine: Negative.   Genitourinary: Negative.   Musculoskeletal: Negative for back pain and neck pain.  Allergic/Immunologic: Negative.   Neurological: Negative for dizziness and light-headedness.  Hematological: Negative for adenopathy. Does not bruise/bleed easily.  Psychiatric/Behavioral: Negative for  dysphoric mood and sleep disturbance (sleeping on 2 pillows). The patient is not nervous/anxious.    Vitals:   06/09/19 1413  BP: (!) 166/61  Pulse: 84  Resp: 18  SpO2: 94%  Weight: 219 lb 4 oz (99.5 kg)  Height: 5\' 3"  (1.6 m)   Wt Readings from Last 3 Encounters:  06/09/19 219 lb 4 oz (99.5 kg)  06/04/19 222 lb 6.4 oz (100.9 kg)  06/01/19 220 lb (99.8 kg)   Lab Results  Component Value Date   CREATININE 0.91 06/04/2019   CREATININE 0.79 06/01/2019   CREATININE 0.98 02/27/2019    Physical Exam Vitals and nursing note reviewed.  Constitutional:      Appearance: Normal appearance.  HENT:     Head: Normocephalic and atraumatic.  Cardiovascular:     Rate and Rhythm: Normal rate and regular rhythm.  Pulmonary:     Effort: Pulmonary effort is normal. No respiratory distress.     Breath sounds: No wheezing or rales.  Abdominal:     General: There is no distension.     Palpations:  Abdomen is soft.  Musculoskeletal:        General: No tenderness.     Cervical back: Normal range of motion.     Right lower leg: Edema (1+ pitting) present.     Left lower leg: Edema (1+ pitting) present.  Skin:    General: Skin is warm and dry.  Neurological:     General: No focal deficit present.     Mental Status: Karen [name] is alert and oriented to person, place, and time.  Psychiatric:        Mood and Affect: Mood normal.        Behavior: Behavior normal.     Assessment & Plan:  1: Chronic heart failure with preserved ejection fraction- - NYHA class III - euvolemic today - weighing daily; reminded to call for an overnight weight gain of >2 pounds or a weekly weight gain of >5 pounds - weight down 3 pounds from Karen [name] last visit here 5 days ago - not adding salt to Karen [name] food - received 80mg  IV lasix/ 9meq PO potassium last week - will check BMP today - discussed changing diuretic to torsemide but will increase Karen [name] furosemide to 40mg  daily - saw cardiology Gilford Rile) 04/16/19 & returns 07/07/19 - participating in paramedicine program - BNP 06/04/19 was 45.0  2: HTN- - BP elevated today; increasing furosemide to 40mg  daily (currently taking 20mg  daily) - saw PCP Dema Severin) 01/23/2019 - BMP 06/04/19 reviewed and showed sodium 129, potassium 3.6, creatinine 0.91 and GFR >60  3: DM- - saw endocrinology Honor Junes) 03/18/19 - A1c 02/24/2019 was 8.4% - glucose at home this morning was 140  4: COPD- - saw pulmonology Raul Del) 03/19/19 - wearing oxygen at 3L around the clock    Patient did not bring Karen [name] medications nor a list. Each medication was verbally reviewed with the patient and Karen [name] was encouraged to bring the bottles to every visit to confirm accuracy of list.  Since patient has cardiology appt in 1 month, will have patient return in 2 months or sooner for any questions/problems before then.

## 2019-06-12 ENCOUNTER — Other Ambulatory Visit: Payer: Self-pay | Admitting: Urology

## 2019-06-15 NOTE — Progress Notes (Signed)
Can we put order in for BMP on that visit

## 2019-06-17 ENCOUNTER — Telehealth (HOSPITAL_COMMUNITY): Payer: Self-pay

## 2019-06-17 NOTE — Telephone Encounter (Signed)
Attempted to contact, no answer.    Charlyne Robertshaw Spicer EMT-Paramedic [phone] 

## 2019-06-18 ENCOUNTER — Other Ambulatory Visit: Payer: Self-pay | Admitting: Family

## 2019-06-18 MED ORDER — FUROSEMIDE 20 MG PO TABS: 40.0000 mg | ORAL_TABLET | Freq: Every day | ORAL | 5 refills | Status: DC

## 2019-06-19 ENCOUNTER — Other Ambulatory Visit: Payer: Self-pay

## 2019-06-19 ENCOUNTER — Encounter: Payer: Self-pay | Admitting: Physician Assistant

## 2019-06-19 ENCOUNTER — Ambulatory Visit (INDEPENDENT_AMBULATORY_CARE_PROVIDER_SITE_OTHER): Payer: Medicare Other | Admitting: Physician Assistant

## 2019-06-19 VITALS — BP 128/71 | HR 84 | Ht 63.0 in | Wt 214.0 lb

## 2019-06-19 DIAGNOSIS — N3281 Overactive bladder: Secondary | ICD-10-CM

## 2019-06-19 NOTE — Progress Notes (Signed)
06/19/2019 2:44 PM   Karen [name] Oct 05, 1945 [id number]  CC: Med check  HPI: Karen [name] is a 74 y.o. female with PMH CHF, COPD, CKD, diabetes, sciatica, nephrolithiasis with urosepsis, and OAB with urge incontinence who presents today for medication refill of Vesicare.  She was seen most recently by Dr. Bernardo Heater on 11/12/2017 for follow-up of the above.  Patient previously failed a trial of Myrbetriq.  She has been on Vesicare daily since her last visit with Dr. Bernardo Heater.  Today, she reports she does not believe Vesicare is helping at all with her urinary symptoms.  She states her urinary urgency, frequency, and urge incontinence are stable.  She denies dysuria.  She states she is using 6-7 diapers daily for urinary leakage.  She states her urinary symptoms are worse during the daytime, though she is bothered by nocturia as well.  PMH: Past Medical History:  Diagnosis Date  . (HFpEF) heart failure with preserved ejection fraction ([place]) 2017   a. 2017 Echo: EF 50%; b. 06/2018 Echo: EF 50-55%; c. 08/2018 Echo: EF 50-55%, Nl RV fxn. RVSP 60.24mmHg. Mild BAE. Mild to mod TR.     Marland Kitchen Acute on chronic respiratory failure with hypoxia and hypercapnia ([place]) 01/07/2015  . Anemia   . 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 Asthma   . Cataract   . CHF (congestive heart failure) ([place])   . CKD (chronic kidney disease), stage III   . COPD (chronic obstructive pulmonary disease) ([place])    (1) 06/2018 tobacco use, home 3L oxygen   . Diabetes mellitus without complication (HCC)    (1) A1C 7.7 (06/2018)  . Edema, peripheral 04/20/2014  . History of kidney stones   . Hyperlipidemia   . Hypertension   . Iron deficiency anemia 06/22/2014  . Junctional bradycardia    a. In setting of beta blocker therapy.  . Leucocytosis 10/19/2015  . Morbid obesity ([place])   . Overactive bladder   . Primary osteoarthritis of right knee 09/01/2016  . Sciatica 01/07/2015    Surgical History: Past Surgical History:  Procedure  Laterality Date  . APPENDECTOMY    . CESAREAN SECTION     x3  . CHOLECYSTECTOMY    . COLONOSCOPY WITH PROPOFOL N/A 08/28/2017   Procedure: COLONOSCOPY WITH PROPOFOL;  Surgeon: Lucilla Lame, MD;  Location[facility] ENDOSCOPY;  Service: Endoscopy;  Laterality: N/A;  . COLONOSCOPY WITH PROPOFOL N/A 08/29/2017   Procedure: COLONOSCOPY WITH PROPOFOL;  Surgeon: Lucilla Lame, MD;  Location[facility] ENDOSCOPY;  Service: Endoscopy;  Laterality: N/A;  . CYSTOSCOPY W/ URETERAL STENT PLACEMENT Right 09/15/2017   Procedure: CYSTOSCOPY WITH RETROGRADE PYELOGRAM/URETERAL STENT PLACEMENT;  Surgeon: Cleon Gustin, MD;  Location: [facility] ORS;  Service: Urology;  Laterality: Right;  . CYSTOSCOPY/URETEROSCOPY/HOLMIUM LASER/STENT PLACEMENT Right 10/09/2017   Procedure: CYSTOSCOPY/URETEROSCOPY/HOLMIUM LASER/STENT PLACEMENT;  Surgeon: Abbie Sons, MD;  Location: [facility] ORS;  Service: Urology;  Laterality: Right;  right Stent exchange  . ESOPHAGOGASTRODUODENOSCOPY (EGD) WITH PROPOFOL N/A 09/16/2018   Procedure: ESOPHAGOGASTRODUODENOSCOPY (EGD) WITH PROPOFOL;  Surgeon: Lin Landsman, MD;  Location: [place];  Service: Gastroenterology;  Laterality: N/A;  . EYE SURGERY      Home Medications:  Allergies as of 06/19/2019      Reactions   Ace Inhibitors Hives   Beta Adrenergic Blockers    Junctional bradycardia   Gabapentin Hives   Lisinopril Hives   Lyrica [pregabalin] Hives   Shrimp [shellfish Allergy] Swelling   Swelling of the lips      Medication  List       Accurate as of June 19, 2019  2:44 PM. If you have any questions, ask your nurse or doctor.        albuterol 108 (90 Base) MCG/ACT inhaler Commonly known as: VENTOLIN HFA INHALE 2 PUFFS BY MOUTH EVERY 6 HOURS AS NEEDED FOR WHEEZING   apixaban 5 MG Tabs tablet Commonly known as: ELIQUIS Take 1 tablet (5 mg total) by mouth 2 (two) times daily.   atorvastatin 10 MG tablet Commonly known as: LIPITOR Take 1 tablet by mouth daily.     esomeprazole 40 MG capsule Commonly known as: NEXIUM Take 40 mg by mouth daily.   fluticasone 50 MCG/ACT nasal spray Commonly known as: FLONASE Place 2 sprays into both nostrils daily.   Fluticasone-Umeclidin-Vilant 100-62.5-25 MCG/INH Aepb Inhale 1 puff into the lungs daily.   furosemide 20 MG tablet Commonly known as: LASIX Take 2 tablets (40 mg total) by mouth daily.   insulin pump Soln Inject 1 each into the skin 3 times daily with meals, bedtime and 2 AM. What changed: additional instructions   ipratropium-albuterol 0.5-2.5 (3) MG/3ML Soln Commonly known as: DUONEB Inhale 3 mLs into the lungs 4 (four) times daily.   Iron 325 (65 Fe) MG Tabs Take 1 tablet by mouth daily.   losartan-hydrochlorothiazide 100-25 MG tablet Commonly known as: HYZAAR Take 1 tablet by mouth daily.   metFORMIN 500 MG tablet Commonly known as: GLUCOPHAGE Take 1,000 mg by mouth 2 (two) times daily with a meal.   montelukast 10 MG tablet Commonly known as: SINGULAIR Take 10 mg by mouth at bedtime.   potassium chloride 10 MEQ tablet Commonly known as: KLOR-CON Take 1 tablet (10 mEq total) by mouth daily.   solifenacin 10 MG tablet Commonly known as: VESICARE Take 1 tablet (10 mg total) by mouth daily.   vitamin B-12 500 MCG tablet Commonly known as: CYANOCOBALAMIN Take 500 mcg by mouth daily.       Allergies:  Allergies  Allergen Reactions  . Ace Inhibitors Hives  . Beta Adrenergic Blockers     Junctional bradycardia  . Gabapentin Hives  . Lisinopril Hives  . Lyrica [Pregabalin] Hives  . Shrimp [Shellfish Allergy] Swelling    Swelling of the lips    Family History: Family History  Problem Relation Age of Onset  . Other Mother        unknown medical history  . Other Father        unknown medical history    Social History:   reports that she quit smoking about 26 years ago. She has a 20.00 pack-year smoking history. She has never used smokeless tobacco. She reports  that she does not drink alcohol or use drugs.  Physical Exam: BP 128/71   Pulse 84   Ht 5\' 3"  (1.6 m)   Wt 214 lb (97.1 kg)   BMI 37.91 kg/m   Constitutional:  Alert and oriented, no acute distress, nontoxic appearing HEENT: [place], AT Cardiovascular: No clubbing, cyanosis, or edema Respiratory: Normal respiratory effort, on supplemental oxygen Skin: No rashes, bruises or suspicious lesions Neurologic: In wheelchair, unable to assess Psychiatric: Normal mood and affect  Assessment & Plan:   1. Overactive bladder 74 year old comorbid female including OAB and nephrolithiasis presents with reports the Vesicare has not improved her urinary symptoms despite being on this medication for more than 1 year.    I explained to the patient today that I am not inclined to refill medication that is not  improving her symptoms and that increases her risk of dementia.  I recommend a 4-week trial of Gemtesa with symptom recheck following.  If she fails a trial of this medication, I recommend alternative therapies including PTNS.  Patient expressed understanding.   Return in about 4 weeks (around 07/17/2019) for Symptom recheck on Gemtesa with PVR.  Debroah Loop, PA-C  [facility] [address] [place],  [address] [phone]

## 2019-06-28 ENCOUNTER — Inpatient Hospital Stay
Admission: EM | Admit: 2019-06-28 | Discharge: 2019-06-30 | DRG: 378 | Disposition: A | Discharge status: Home / Self Care | Payer: Medicare Other | Attending: Internal Medicine | Admitting: Internal Medicine

## 2019-06-28 ENCOUNTER — Inpatient Hospital Stay: Payer: Medicare Other

## 2019-06-28 ENCOUNTER — Encounter: Payer: Self-pay | Admitting: Internal Medicine

## 2019-06-28 ENCOUNTER — Other Ambulatory Visit: Payer: Self-pay

## 2019-06-28 DIAGNOSIS — I482 Chronic atrial fibrillation, unspecified: Secondary | ICD-10-CM | POA: Diagnosis present

## 2019-06-28 DIAGNOSIS — Z79899 Other long term (current) drug therapy: Secondary | ICD-10-CM

## 2019-06-28 DIAGNOSIS — E1142 Type 2 diabetes mellitus with diabetic polyneuropathy: Secondary | ICD-10-CM | POA: Diagnosis present

## 2019-06-28 DIAGNOSIS — I5032 Chronic diastolic (congestive) heart failure: Secondary | ICD-10-CM | POA: Diagnosis present

## 2019-06-28 DIAGNOSIS — K219 Gastro-esophageal reflux disease without esophagitis: Secondary | ICD-10-CM | POA: Diagnosis present

## 2019-06-28 DIAGNOSIS — Z87891 Personal history of nicotine dependence: Secondary | ICD-10-CM | POA: Diagnosis not present

## 2019-06-28 DIAGNOSIS — I130 Hypertensive heart and chronic kidney disease with heart failure and stage 1 through stage 4 chronic kidney disease, or unspecified chronic kidney disease: Secondary | ICD-10-CM | POA: Diagnosis present

## 2019-06-28 DIAGNOSIS — N183 Chronic kidney disease, stage 3 unspecified: Secondary | ICD-10-CM | POA: Diagnosis present

## 2019-06-28 DIAGNOSIS — Z9641 Presence of insulin pump (external) (internal): Secondary | ICD-10-CM | POA: Diagnosis present

## 2019-06-28 DIAGNOSIS — N1831 Chronic kidney disease, stage 3a: Secondary | ICD-10-CM | POA: Diagnosis not present

## 2019-06-28 DIAGNOSIS — Z20822 Contact with and (suspected) exposure to covid-19: Secondary | ICD-10-CM | POA: Diagnosis present

## 2019-06-28 DIAGNOSIS — Z66 Do not resuscitate: Secondary | ICD-10-CM | POA: Diagnosis present

## 2019-06-28 DIAGNOSIS — E1122 Type 2 diabetes mellitus with diabetic chronic kidney disease: Secondary | ICD-10-CM | POA: Diagnosis present

## 2019-06-28 DIAGNOSIS — Z91013 Allergy to seafood: Secondary | ICD-10-CM

## 2019-06-28 DIAGNOSIS — D509 Iron deficiency anemia, unspecified: Secondary | ICD-10-CM | POA: Diagnosis present

## 2019-06-28 DIAGNOSIS — K625 Hemorrhage of anus and rectum: Secondary | ICD-10-CM | POA: Diagnosis present

## 2019-06-28 DIAGNOSIS — J432 Centrilobular emphysema: Secondary | ICD-10-CM | POA: Diagnosis not present

## 2019-06-28 DIAGNOSIS — E1129 Type 2 diabetes mellitus with other diabetic kidney complication: Secondary | ICD-10-CM | POA: Diagnosis present

## 2019-06-28 DIAGNOSIS — Z794 Long term (current) use of insulin: Secondary | ICD-10-CM | POA: Diagnosis not present

## 2019-06-28 DIAGNOSIS — E876 Hypokalemia: Secondary | ICD-10-CM | POA: Diagnosis present

## 2019-06-28 DIAGNOSIS — Z888 Allergy status to other drugs, medicaments and biological substances status: Secondary | ICD-10-CM | POA: Diagnosis not present

## 2019-06-28 DIAGNOSIS — J449 Chronic obstructive pulmonary disease, unspecified: Secondary | ICD-10-CM | POA: Diagnosis present

## 2019-06-28 DIAGNOSIS — E871 Hypo-osmolality and hyponatremia: Secondary | ICD-10-CM | POA: Diagnosis present

## 2019-06-28 DIAGNOSIS — K922 Gastrointestinal hemorrhage, unspecified: Secondary | ICD-10-CM

## 2019-06-28 DIAGNOSIS — E860 Dehydration: Secondary | ICD-10-CM | POA: Diagnosis present

## 2019-06-28 DIAGNOSIS — Z7901 Long term (current) use of anticoagulants: Secondary | ICD-10-CM

## 2019-06-28 DIAGNOSIS — M5430 Sciatica, unspecified side: Secondary | ICD-10-CM | POA: Diagnosis present

## 2019-06-28 DIAGNOSIS — E1165 Type 2 diabetes mellitus with hyperglycemia: Secondary | ICD-10-CM | POA: Diagnosis present

## 2019-06-28 DIAGNOSIS — K5731 Diverticulosis of large intestine without perforation or abscess with bleeding: Secondary | ICD-10-CM | POA: Diagnosis not present

## 2019-06-28 DIAGNOSIS — E1169 Type 2 diabetes mellitus with other specified complication: Secondary | ICD-10-CM | POA: Diagnosis present

## 2019-06-28 DIAGNOSIS — E785 Hyperlipidemia, unspecified: Secondary | ICD-10-CM | POA: Diagnosis present

## 2019-06-28 DIAGNOSIS — I10 Essential (primary) hypertension: Secondary | ICD-10-CM

## 2019-06-28 DIAGNOSIS — D72829 Elevated white blood cell count, unspecified: Secondary | ICD-10-CM | POA: Diagnosis present

## 2019-06-28 HISTORY — DX: Gastrointestinal hemorrhage, unspecified: K92.2

## 2019-06-28 LAB — BASIC METABOLIC PANEL
Anion gap: 13 (ref 5–15)
Anion gap: 11 (ref 5–15)
BUN: 17 mg/dL (ref 8–23)
BUN: 15 mg/dL (ref 8–23)
CO2: 31 mmol/L (ref 22–32)
CO2: 31 mmol/L (ref 22–32)
Calcium: 9.1 mg/dL (ref 8.9–10.3)
Calcium: 8.7 mg/dL — ABNORMAL LOW (ref 8.9–10.3)
Chloride: 89 mmol/L — ABNORMAL LOW (ref 98–111)
Chloride: 90 mmol/L — ABNORMAL LOW (ref 98–111)
Creatinine, Ser: 0.73 mg/dL (ref 0.44–1.00)
Creatinine, Ser: 0.78 mg/dL (ref 0.44–1.00)
GFR calc Af Amer: >60 mL/min (ref >60)
GFR calc Af Amer: >60 mL/min (ref >60)
GFR calc non Af Amer: >60 mL/min (ref >60)
GFR calc non Af Amer: >60 mL/min (ref >60)
Glucose, Bld: 145 mg/dL — ABNORMAL HIGH (ref 70–99)
Glucose, Bld: 149 mg/dL — ABNORMAL HIGH (ref 70–99)
Potassium: 3.5 mmol/L (ref 3.5–5.1)
Potassium: 3.5 mmol/L (ref 3.5–5.1)
Sodium: 133 mmol/L — ABNORMAL LOW (ref 135–145)
Sodium: 132 mmol/L — ABNORMAL LOW (ref 135–145)

## 2019-06-28 LAB — CBC WITH DIFFERENTIAL/PLATELET
Abs Immature Granulocytes: 0.1 10*3/uL — ABNORMAL HIGH (ref 0.00–0.07)
Basophils Absolute: 0.1 10*3/uL (ref 0.0–0.1)
Basophils Relative: 1 %
Eosinophils Absolute: 0.2 10*3/uL (ref 0.0–0.5)
Eosinophils Relative: 1 %
HCT: 30.4 % — ABNORMAL LOW (ref 36.0–46.0)
Hemoglobin: 10 g/dL — ABNORMAL LOW (ref 12.0–15.0)
Immature Granulocytes: 1 %
Lymphocytes Relative: 10 %
Lymphs Abs: 1.4 10*3/uL (ref 0.7–4.0)
MCH: 28.2 pg (ref 26.0–34.0)
MCHC: 32.9 g/dL (ref 30.0–36.0)
MCV: 85.9 fL (ref 80.0–100.0)
Monocytes Absolute: 0.9 10*3/uL (ref 0.1–1.0)
Monocytes Relative: 7 %
Neutro Abs: 10.9 10*3/uL — ABNORMAL HIGH (ref 1.7–7.7)
Neutrophils Relative %: 80 %
Platelets: 317 10*3/uL (ref 150–400)
RBC: 3.54 MIL/uL — ABNORMAL LOW (ref 3.87–5.11)
RDW: 14.5 % (ref 11.5–15.5)
WBC: 13.4 10*3/uL — ABNORMAL HIGH (ref 4.0–10.5)
nRBC: 0 % (ref 0.0–0.2)

## 2019-06-28 LAB — CBC
HCT: 27.8 % — ABNORMAL LOW (ref 36.0–46.0)
HCT: 30.8 % — ABNORMAL LOW (ref 36.0–46.0)
HCT: 28.1 % — ABNORMAL LOW (ref 36.0–46.0)
Hemoglobin: 9.3 g/dL — ABNORMAL LOW (ref 12.0–15.0)
Hemoglobin: 10.3 g/dL — ABNORMAL LOW (ref 12.0–15.0)
Hemoglobin: 9.3 g/dL — ABNORMAL LOW (ref 12.0–15.0)
MCH: 29 pg (ref 26.0–34.0)
MCH: 28.9 pg (ref 26.0–34.0)
MCH: 28.4 pg (ref 26.0–34.0)
MCHC: 33.5 g/dL (ref 30.0–36.0)
MCHC: 33.4 g/dL (ref 30.0–36.0)
MCHC: 33.1 g/dL (ref 30.0–36.0)
MCV: 86.6 fL (ref 80.0–100.0)
MCV: 86.5 fL (ref 80.0–100.0)
MCV: 85.9 fL (ref 80.0–100.0)
Platelets: 272 10*3/uL (ref 150–400)
Platelets: 301 10*3/uL (ref 150–400)
Platelets: 290 10*3/uL (ref 150–400)
RBC: 3.21 MIL/uL — ABNORMAL LOW (ref 3.87–5.11)
RBC: 3.56 MIL/uL — ABNORMAL LOW (ref 3.87–5.11)
RBC: 3.27 MIL/uL — ABNORMAL LOW (ref 3.87–5.11)
RDW: 14.5 % (ref 11.5–15.5)
RDW: 14.5 % (ref 11.5–15.5)
RDW: 14.6 % (ref 11.5–15.5)
WBC: 14 10*3/uL — ABNORMAL HIGH (ref 4.0–10.5)
WBC: 16.4 10*3/uL — ABNORMAL HIGH (ref 4.0–10.5)
WBC: 13.4 10*3/uL — ABNORMAL HIGH (ref 4.0–10.5)
nRBC: 0 % (ref 0.0–0.2)
nRBC: 0 % (ref 0.0–0.2)
nRBC: 0 % (ref 0.0–0.2)

## 2019-06-28 LAB — COMPREHENSIVE METABOLIC PANEL
ALT: 18 U/L (ref 0–44)
AST: 16 U/L (ref 15–41)
Albumin: 3.6 g/dL (ref 3.5–5.0)
Alkaline Phosphatase: 60 U/L (ref 38–126)
Anion gap: 12 (ref 5–15)
BUN: 18 mg/dL (ref 8–23)
CO2: 31 mmol/L (ref 22–32)
Calcium: 8.6 mg/dL — ABNORMAL LOW (ref 8.9–10.3)
Chloride: 84 mmol/L — ABNORMAL LOW (ref 98–111)
Creatinine, Ser: 0.85 mg/dL (ref 0.44–1.00)
GFR calc Af Amer: >60 mL/min (ref >60)
GFR calc non Af Amer: >60 mL/min (ref >60)
Glucose, Bld: 283 mg/dL — ABNORMAL HIGH (ref 70–99)
Potassium: 3.2 mmol/L — ABNORMAL LOW (ref 3.5–5.1)
Sodium: 127 mmol/L — ABNORMAL LOW (ref 135–145)
Total Bilirubin: 0.8 mg/dL (ref 0.3–1.2)
Total Protein: 6.6 g/dL (ref 6.5–8.1)

## 2019-06-28 LAB — PROTIME-INR
INR: 1.1 (ref 0.8–1.2)
Prothrombin Time: 13.9 seconds (ref 11.4–15.2)

## 2019-06-28 LAB — RESPIRATORY PANEL BY RT PCR (FLU A&B, COVID)
Influenza A by PCR: NEGATIVE
Influenza B by PCR: NEGATIVE
SARS Coronavirus 2 by RT PCR: NEGATIVE

## 2019-06-28 LAB — TYPE AND SCREEN
ABO/RH(D): A NEG
Antibody Screen: NEGATIVE

## 2019-06-28 LAB — OSMOLALITY: Osmolality: 283 mOsm/kg (ref 275–295)

## 2019-06-28 LAB — GLUCOSE, CAPILLARY: Glucose-Capillary: 165 mg/dL — ABNORMAL HIGH (ref 70–99)

## 2019-06-28 LAB — BRAIN NATRIURETIC PEPTIDE: B Natriuretic Peptide: 40 pg/mL (ref 0.0–100.0)

## 2019-06-28 LAB — APTT: aPTT: 27 seconds (ref 24–36)

## 2019-06-28 MED ORDER — IPRATROPIUM-ALBUTEROL 0.5-2.5 (3) MG/3ML IN SOLN
3.0000 mL | Freq: Four times a day (QID) | RESPIRATORY_TRACT | Status: DC
  Administered 2019-06-28 – 2019-06-30 (×6): 3 mL via RESPIRATORY_TRACT
  Filled 2019-06-28 (×6): qty 3

## 2019-06-28 MED ORDER — FLUTICASONE FUROATE-VILANTEROL 100-25 MCG/INH IN AEPB
1.0000 | INHALATION_SPRAY | Freq: Every day | RESPIRATORY_TRACT | Status: DC
  Administered 2019-06-29 – 2019-06-30 (×2): 1 via RESPIRATORY_TRACT
  Filled 2019-06-28: qty 28

## 2019-06-28 MED ORDER — FLUTICASONE-UMECLIDIN-VILANT 100-62.5-25 MCG/INH IN AEPB: 1.0000 | INHALATION_SPRAY | Freq: Every day | RESPIRATORY_TRACT | Status: DC

## 2019-06-28 MED ORDER — FLUTICASONE PROPIONATE 50 MCG/ACT NA SUSP
2.0000 | Freq: Every day | NASAL | Status: DC
  Administered 2019-06-29 – 2019-06-30 (×2): 2 via NASAL
  Filled 2019-06-28: qty 16

## 2019-06-28 MED ORDER — POTASSIUM CHLORIDE CRYS ER 20 MEQ PO TBCR
40.0000 meq | EXTENDED_RELEASE_TABLET | Freq: Once | ORAL | Status: AC
  Administered 2019-06-28: 12:00:00 40 meq via ORAL
  Filled 2019-06-28: qty 2

## 2019-06-28 MED ORDER — PROTHROMBIN COMPLEX CONC HUMAN 500 UNITS IV KIT: 25.0000 [IU]/kg | PACK | Status: DC

## 2019-06-28 MED ORDER — ACETAMINOPHEN 325 MG PO TABS
650.0000 mg | ORAL_TABLET | Freq: Four times a day (QID) | ORAL | Status: DC | PRN
  Administered 2019-06-28 – 2019-06-29 (×3): 650 mg via ORAL
  Filled 2019-06-28 (×3): qty 2

## 2019-06-28 MED ORDER — ONDANSETRON HCL 4 MG/2ML IJ SOLN
4.0000 mg | Freq: Three times a day (TID) | INTRAMUSCULAR | Status: DC | PRN
  Administered 2019-06-28: 14:00:00 4 mg via INTRAVENOUS
  Filled 2019-06-28: qty 2

## 2019-06-28 MED ORDER — TECHNETIUM TC 99M-LABELED RED BLOOD CELLS IV KIT
20.0000 | PACK | Freq: Once | INTRAVENOUS | Status: AC | PRN
  Administered 2019-06-28: 15:00:00 22.757 via INTRAVENOUS

## 2019-06-28 MED ORDER — HYDRALAZINE HCL 20 MG/ML IJ SOLN: 5.0000 mg | INTRAMUSCULAR | Status: DC | PRN

## 2019-06-28 MED ORDER — ATORVASTATIN CALCIUM 10 MG PO TABS
10.0000 mg | ORAL_TABLET | Freq: Every day | ORAL | Status: DC
  Administered 2019-06-29 – 2019-06-30 (×2): 10 mg via ORAL
  Filled 2019-06-28 (×2): qty 1

## 2019-06-28 MED ORDER — MONTELUKAST SODIUM 10 MG PO TABS
10.0000 mg | ORAL_TABLET | Freq: Every day | ORAL | Status: DC
  Administered 2019-06-28 – 2019-06-29 (×2): 10 mg via ORAL
  Filled 2019-06-28 (×2): qty 1

## 2019-06-28 MED ORDER — ALBUTEROL SULFATE (2.5 MG/3ML) 0.083% IN NEBU: 3.0000 mL | INHALATION_SOLUTION | RESPIRATORY_TRACT | Status: DC | PRN

## 2019-06-28 MED ORDER — UMECLIDINIUM BROMIDE 62.5 MCG/INH IN AEPB
1.0000 | INHALATION_SPRAY | Freq: Every day | RESPIRATORY_TRACT | Status: DC
  Administered 2019-06-29 – 2019-06-30 (×2): 1 via RESPIRATORY_TRACT
  Filled 2019-06-28: qty 7

## 2019-06-28 MED ORDER — IPRATROPIUM-ALBUTEROL 0.5-2.5 (3) MG/3ML IN SOLN: 3.0000 mL | Freq: Four times a day (QID) | RESPIRATORY_TRACT | Status: DC

## 2019-06-28 MED ORDER — PANTOPRAZOLE SODIUM 40 MG IV SOLR
40.0000 mg | Freq: Two times a day (BID) | INTRAVENOUS | Status: DC
  Administered 2019-06-28 – 2019-06-29 (×2): 40 mg via INTRAVENOUS
  Filled 2019-06-28 (×2): qty 40

## 2019-06-28 MED ORDER — PANTOPRAZOLE SODIUM 40 MG IV SOLR
40.0000 mg | Freq: Once | INTRAVENOUS | Status: AC
  Administered 2019-06-28: 11:00:00 40 mg via INTRAVENOUS
  Filled 2019-06-28: qty 40

## 2019-06-28 MED ORDER — INSULIN PUMP
1.0000 | Freq: Three times a day (TID) | SUBCUTANEOUS | Status: DC
  Administered 2019-06-29 – 2019-06-30 (×4): 1 via SUBCUTANEOUS
  Filled 2019-06-28: qty 1

## 2019-06-28 MED ORDER — SODIUM CHLORIDE 0.9 % IV SOLN: INTRAVENOUS | Status: DC

## 2019-06-28 MED ORDER — FERROUS SULFATE 325 (65 FE) MG PO TABS
325.0000 mg | ORAL_TABLET | Freq: Every day | ORAL | Status: DC
  Administered 2019-06-29 – 2019-06-30 (×2): 325 mg via ORAL
  Filled 2019-06-28 (×2): qty 1

## 2019-06-28 NOTE — Progress Notes (Signed)
Stored patient's insulin pump supplies, per patient request, in the unit refrigerator. Labeled with name and room number. Please advise to return to patient when she is discharged. Thank you. Wenda Low [facility]

## 2019-06-28 NOTE — ED Notes (Signed)
Pt assisted to bedside commode by this RN. Pt noted to have had frank dark bloody diarrhea. Pt assisted into clean brief by this RN, pt assisted into clean gown by this RN. Pt tolerated well. Pt's son to bedside at this time.

## 2019-06-28 NOTE — ED Triage Notes (Signed)
Pt arrived via POV with reports of rectal bleeding since yesterday, states there are clots at times and other times bright red blood.  Has had the same before and was admitted.  Pt wears 3L [place] continuous.  Pt denies any vomiting, c/o lower abdominal pain and back pain.   Pt denies any rectal pain.

## 2019-06-28 NOTE — ED Notes (Signed)
Pt presents to ED via POV with c/o bright red rectal bleeding since yesterday. Pt states notes bleeding when having bowel movement and when urinating. Pt states intermittently passing clots. Pt noted on chronic 3L via [place]. Pt repositioned in bed by this RN. Pt neurologically intact. IV therapy initiated by this RN and blood work sent to lab. Call bell placed within reach of patient. Pt with personal cell phone at bedside, awaiting arrival of her son to bedside.

## 2019-06-28 NOTE — ED Provider Notes (Signed)
[facility] Emergency Department Provider Note ____________________________________________   First MD Initiated Contact with Patient 06/28/19 [phone]     (approximate)  I have reviewed the triage vital signs and the nursing notes.   HISTORY  Chief Complaint Rectal Bleeding    HPI Karen [name] is a 74 y.o. female with PMH as noted below including COPD as well as prior GI bleed who presents with blood in the stool, acute onset yesterday, described as bright red blood along with some darker clots.  The patient denies any vomiting.  She has no abdominal pain.  She states that she is on Eliquis.  She denies any other abnormal bleeding.  Past Medical History:  Diagnosis Date  . (HFpEF) heart failure with preserved ejection fraction ([place]) 2017   a. 2017 Echo: EF 50%; b. 06/2018 Echo: EF 50-55%; c. 08/2018 Echo: EF 50-55%, Nl RV fxn. RVSP 60.40mmHg. Mild BAE. Mild to mod TR.     Marland Kitchen Acute on chronic respiratory failure with hypoxia and hypercapnia ([place]) 01/07/2015  . Anemia   . 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 Asthma   . Cataract   . CHF (congestive heart failure) ([place])   . CKD (chronic kidney disease), stage III   . COPD (chronic obstructive pulmonary disease) ([place])    (1) 06/2018 tobacco use, home 3L oxygen   . Diabetes mellitus without complication (HCC)    (1) A1C 7.7 (06/2018)  . Edema, peripheral 04/20/2014  . History of kidney stones   . Hyperlipidemia   . Hypertension   . Iron deficiency anemia 06/22/2014  . Junctional bradycardia    a. In setting of beta blocker therapy.  . Leucocytosis 10/19/2015  . Morbid obesity ([place])   . Overactive bladder   . Primary osteoarthritis of right knee 09/01/2016  . Sciatica 01/07/2015    Patient Active Problem List   Diagnosis Date Noted  . Pulmonary edema 02/27/2019  . Bradycardia 02/24/2019  . Chronic respiratory failure with hypoxia ([place]) 02/23/2019  . Atypical chest pain 02/13/2019  . AVM (arteriovenous malformation) of  small bowel, acquired   . Acute gastric ulcer with hemorrhage   . Chronic diastolic heart failure ([place]) 08/22/2018  . Diarrhea 08/22/2018  . Junctional bradycardia   . Acute on chronic heart failure with preserved ejection fraction (HFpEF) ([place])   . AKI (acute kidney injury) ([place])   . Symptomatic bradycardia 08/05/2018  . Acute on chronic respiratory failure ([place]) 06/25/2018  . 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 diabetes mellitus ([place]) 01/25/2018  . History of non anemic vitamin B12 deficiency 01/25/2018  . Personal history of kidney stones 11/12/2017  . Urge incontinence 11/12/2017  . Right ureteral stone 09/15/2017  . Acute GI bleeding   . GI bleed 08/26/2017  . Arthritis 08/10/2017  . Chronic kidney disease 08/10/2017  . COPD (chronic obstructive pulmonary disease) ([place]) 08/10/2017  . Diabetes mellitus type 2, uncomplicated ([place]) 17/51/0258  . Hypertension 08/10/2017  . Obesity (BMI 35.0-39.9 without comorbidity) 04/11/2017  . Primary osteoarthritis of right knee 09/01/2016  . Leucocytosis 10/19/2015  . 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 Acute on chronic respiratory failure with hypoxia and hypercapnia ([place]) 01/07/2015  . Sciatica 01/07/2015  . Weakness 01/07/2015  . 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 Iron deficiency anemia 06/22/2014  . Microalbuminuria 06/22/2014  . CHF (congestive heart failure) ([place]) 04/20/2014  . Edema, peripheral 04/20/2014    Past Surgical History:  Procedure Laterality Date  . APPENDECTOMY    .  CESAREAN SECTION     x3  . CHOLECYSTECTOMY    . COLONOSCOPY WITH PROPOFOL N/A 08/28/2017   Procedure: COLONOSCOPY WITH PROPOFOL;  Surgeon: Lucilla Lame, MD;  Location: [facility] ENDOSCOPY;  Service: Endoscopy;  Laterality: N/A;  . COLONOSCOPY WITH PROPOFOL N/A 08/29/2017   Procedure: COLONOSCOPY WITH PROPOFOL;  Surgeon: Lucilla Lame, MD;  Location: [facility] ENDOSCOPY;  Service: Endoscopy;  Laterality: N/A;  . CYSTOSCOPY W/  URETERAL STENT PLACEMENT Right 09/15/2017   Procedure: CYSTOSCOPY WITH RETROGRADE PYELOGRAM/URETERAL STENT PLACEMENT;  Surgeon: Cleon Gustin, MD;  Location: [facility] ORS;  Service: Urology;  Laterality: Right;  . CYSTOSCOPY/URETEROSCOPY/HOLMIUM LASER/STENT PLACEMENT Right 10/09/2017   Procedure: CYSTOSCOPY/URETEROSCOPY/HOLMIUM LASER/STENT PLACEMENT;  Surgeon: Abbie Sons, MD;  Location: [facility] ORS;  Service: Urology;  Laterality: Right;  right Stent exchange  . ESOPHAGOGASTRODUODENOSCOPY (EGD) WITH PROPOFOL N/A 09/16/2018   Procedure: ESOPHAGOGASTRODUODENOSCOPY (EGD) WITH PROPOFOL;  Surgeon: Lin Landsman, MD;  Location: [place];  Service: Gastroenterology;  Laterality: N/A;  . EYE SURGERY      Prior to Admission medications   Medication Sig Start Date End Date Taking? Authorizing Provider  albuterol (VENTOLIN HFA) 108 (90 Base) MCG/ACT inhaler INHALE 2 PUFFS BY MOUTH EVERY 6 HOURS AS NEEDED FOR WHEEZING 01/02/19   [provider]  apixaban (ELIQUIS) 5 MG TABS tablet Take 1 tablet (5 mg total) by mouth 2 (two) times daily. 04/09/19   [name], MD  atorvastatin (LIPITOR) 10 MG tablet Take 1 tablet by mouth daily.    [provider]  esomeprazole (NEXIUM) 40 MG capsule Take 40 mg by mouth daily. 12/17/18   [provider]  Ferrous Sulfate (IRON) 325 (65 Fe) MG TABS Take 1 tablet by mouth daily. 09/14/17   [provider]  fluticasone (FLONASE) 50 MCG/ACT nasal spray Place 2 sprays into both nostrils daily. 11/15/18   [provider]  Fluticasone-Umeclidin-Vilant 100-62.5-25 MCG/INH AEPB Inhale 1 puff into the lungs daily. 12/25/17   [provider]  furosemide (LASIX) 20 MG tablet Take 2 tablets (40 mg total) by mouth daily. 06/18/19   Alisa Graff, FNP  Insulin Human (INSULIN PUMP) SOLN Inject 1 each into the skin 3 times daily with meals, bedtime and 2 AM. Patient taking differently: Inject 1 each into the skin 3 times  daily with meals, bedtime and 2 AM. Humalog Insulin 76 units daily 09/18/17   Dustin Flock, MD  ipratropium-albuterol (DUONEB) 0.5-2.5 (3) MG/3ML SOLN Inhale 3 mLs into the lungs 4 (four) times daily. 02/05/19   [provider]  losartan-hydrochlorothiazide (HYZAAR) 100-25 MG tablet Take 1 tablet by mouth daily. 01/05/19   [provider]  metFORMIN (GLUCOPHAGE) 500 MG tablet Take 1,000 mg by mouth 2 (two) times daily with a meal.     [provider]  montelukast (SINGULAIR) 10 MG tablet Take 10 mg by mouth at bedtime.    [provider]  potassium chloride (KLOR-CON) 10 MEQ tablet Take 1 tablet (10 mEq total) by mouth daily. 02/28/19   Nolberto Hanlon, MD  vitamin B-12 (CYANOCOBALAMIN) 500 MCG tablet Take 500 mcg by mouth daily.    [provider]    Allergies Ace inhibitors, Beta adrenergic blockers, Gabapentin, Lisinopril, Lyrica [pregabalin], and Shrimp [shellfish allergy]  Family History  Problem Relation Age of Onset  . Other Mother        unknown medical history  . Other Father        unknown medical history    Social History Social History  Tobacco Use  . Smoking status: Former Smoker    Packs/day: 1.00    Years: 20.00    Pack years: 20.00    Quit date: 12/04/1992    Years since quitting: 26.5  . Smokeless tobacco: Never Used  Substance Use Topics  . Alcohol use: No  . Drug use: No    Review of Systems  Constitutional: No fever/chills. Eyes: No redness. ENT: No sore throat. Cardiovascular: Denies chest pain. Respiratory: Denies shortness of breath. Gastrointestinal: No vomiting. Genitourinary: Negative for dysuria or hematuria.  Musculoskeletal: Negative for back pain. Skin: Negative for rash. Neurological: Negative for headaches.   ____________________________________________   PHYSICAL EXAM:  VITAL SIGNS: ED Triage Vitals  Enc Vitals Group     BP 06/28/19 0909 (!) 108/57     Pulse Rate 06/28/19 0909 89      Resp 06/28/19 0909 20     Temp 06/28/19 0909 97.8 F (36.6 C)     Temp Source 06/28/19 0909 Oral     SpO2 06/28/19 0909 98 %     Weight 06/28/19 0910 214 lb (97.1 kg)     Height 06/28/19 0910 5\' 3"  (1.6 m)     Head Circumference --      Peak Flow --      Pain Score 06/28/19 0909 5     Pain Loc --      Pain Edu? --      Excl. in [place]? --     Constitutional: Alert and oriented.  Relatively well appearing and in no acute distress. Eyes: Conjunctivae are normal.  Head: Atraumatic. Nose: No congestion/rhinnorhea. Mouth/Throat: Mucous membranes are moist.   Neck: Normal range of motion.  Cardiovascular: Normal rate, regular rhythm. Good peripheral circulation. Respiratory: Normal respiratory effort.  No retractions.  Gastrointestinal: Soft and nontender. No distention.  Genitourinary: No flank tenderness. Musculoskeletal: Extremities warm and well perfused.  Neurologic:  Normal speech and language. No gross focal neurologic deficits are appreciated.  Skin:  Skin is warm and dry. No rash noted. Psychiatric: Mood and affect are normal. Speech and behavior are normal.  ____________________________________________   LABS (all labs ordered are listed, but only abnormal results are displayed)  Labs Reviewed  CBC WITH DIFFERENTIAL/PLATELET - Abnormal; Notable for the following components:      Result Value   WBC 13.4 (*)    RBC 3.54 (*)    Hemoglobin 10.0 (*)    HCT 30.4 (*)    Neutro Abs 10.9 (*)    Abs Immature Granulocytes 0.10 (*)    All other components within normal limits  COMPREHENSIVE METABOLIC PANEL - Abnormal; Notable for the following components:   Sodium 127 (*)    Potassium 3.2 (*)    Chloride 84 (*)    Glucose, Bld 283 (*)    Calcium 8.6 (*)    All other components within normal limits  RESPIRATORY PANEL BY RT PCR (FLU A&B, COVID)  PROTIME-INR  URINALYSIS, COMPLETE (UACMP) WITH MICROSCOPIC  TYPE AND SCREEN    ____________________________________________  EKG   ____________________________________________  RADIOLOGY    ____________________________________________   PROCEDURES  Procedure(s) performed: No  Procedures  Critical Care performed: No ____________________________________________   INITIAL IMPRESSION / ASSESSMENT AND PLAN / ED COURSE  Pertinent labs & imaging results that were available during my care of the patient were reviewed by me and considered in my medical decision making (see chart for details).  74 year old female with PMH as noted above presents with rectal bleeding since yesterday.  She denies any abdominal pain, and has not had any vomiting.  She reports a prior history of GI bleeding and is on Eliquis.  I reviewed the past medical records in epic.  The patient's most recent visit and admission were for cardiac issues, however she was admitted in July of last year with an episode of coffee-ground emesis.  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  Previously the patient had a colonoscopy in 2019 showing diverticulosis and bleeding.  On exam the patient is overall relatively well-appearing and vital signs are normal.  The abdomen is soft and nontender.  Stool observed in the patient's diaper is bright brownish-red consistent with fresh blood, rather than melena.  Overall presentation is most consistent with bleeding from a lower GI etiology.  We will obtain a lab work-up.  Given the patient's age, anticoagulation, prior GI bleed history and elevated risk I plan for admission.  ----------------------------------------- 11:14 AM on 06/28/2019 -----------------------------------------  The patient's vital signs have remained stable.  Hemoglobin is 10 which is around the patient's baseline.  I discussed the case with the hospitalist Dr. Blaine Hamper for admission. ____________________________________________   FINAL CLINICAL  IMPRESSION(S) / ED DIAGNOSES  Final diagnoses:  Lower GI bleed      NEW MEDICATIONS STARTED DURING THIS VISIT:  New Prescriptions   No medications on file     Note:  This document was prepared using Dragon voice recognition software and may include unintentional dictation errors.    Arta Silence, MD 06/28/19 1114

## 2019-06-28 NOTE — Progress Notes (Signed)
PT Cancellation Note  Patient Details Name: Karen [name] MRN: [id number] DOB: 02/25/1946   Cancelled Treatment:    Reason Eval/Treat Not Completed: Patient declined, no reason specified.   [address], [place], [place] DPT 06/28/2019, 3:04 PM

## 2019-06-28 NOTE — Progress Notes (Signed)
Patient admitted with an insulin pump. Apparently has had it for years and manages it herself. Also states she has all the relevant replacement supplies / equipment. Already informed physician via secure chat. Will fill out flowsheet data and consult the DM coordinator to see the patient, hopefully in the AM. Will continue to monitor overall status for the remainder of the shift. Patient remains in [place] for "blood loss" exam. Karen [name] [facility]

## 2019-06-28 NOTE — ED Notes (Addendum)
Repeat blood work obtained by this Therapist, sports. Pt tolerated well. Pt repositioned in bed by this RN. Pt denies further needs at this time, call bell remains within reach. Pt with personal cell phone at bedside at this time.  Admitting MD at bedside at this time.

## 2019-06-28 NOTE — H&P (Addendum)
History and Physical    Karen [name] ZDG:[id number] DOB: April 30, 1945 DOA: 06/28/2019  Referring MD/NP/PA:   PCP: Ricardo Jericho, NP   Patient coming from:  The patient is coming from home.  At baseline, pt is independent for most of ADL.        Chief Complaint: Rectal bleeding HPI: Karen [name] is a 74 y.o. 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  Patient states that she has had approximately 10 times of rectal bleeding since yesterday.  Patient denies nausea, vomiting, abdominal pain.  She does not have chest pain, shortness breath, cough.  No fever or chills.  Denies symptoms of UTI or unilateral weakness.  Patient states that her last Eliquis dose was in this morning  ED Course: pt was found to have hemoglobin 10.0 (10.9 on 06/01/2019), pending COVID-19 PCR, WBC 13.4, INR 1.1, potassium 3.2, renal function at baseline, temperature normal, blood pressure 129/56, heart rate 82, oxygen saturation 98% on 3 L home level of oxygen.  Patient is admitted to progressive bed as inpatient.  GI, Dr. Allen Norris is consulted.  Review of Systems:   General: no fevers, chills, no body weight gain, fatigue HEENT: no blurry vision, hearing changes or sore throat Respiratory: no dyspnea, coughing, wheezing CV: no chest pain, no palpitations GI: no nausea, vomiting, abdominal pain, diarrhea, constipation. Rectal bleeding GU: no dysuria, burning on urination, increased urinary frequency, hematuria  Ext: no leg edema Neuro: no unilateral weakness, numbness, or tingling, no vision change or hearing loss Skin: no rash, no skin  tear. MSK: No muscle spasm, no deformity, no limitation of range of movement in spin Heme: No easy bruising.  Travel history: No recent long distant travel.  Allergy:  Allergies  Allergen Reactions  . Ace Inhibitors Hives  . Beta Adrenergic Blockers     Junctional bradycardia  . Gabapentin Hives  . Lisinopril Hives  . Lyrica [Pregabalin] Hives  . Shrimp [Shellfish Allergy] Swelling    Swelling of the lips    Past Medical History:  Diagnosis Date  . (HFpEF) heart failure with preserved ejection fraction ([place]) 2017   a. 2017 Echo: EF 50%; b. 06/2018 Echo: EF 50-55%; c. 08/2018 Echo: EF 50-55%, Nl RV fxn. RVSP 60.47mmHg. Mild BAE. Mild to mod TR.     Marland Kitchen Acute on chronic respiratory failure with hypoxia and hypercapnia ([place]) 01/07/2015  . Anemia   . 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 Asthma   . Cataract   . CHF (congestive heart failure) ([place])   . CKD (chronic kidney disease), stage III   . COPD (chronic obstructive pulmonary disease) ([place])    (1) 06/2018 tobacco use, home 3L oxygen   . Diabetes mellitus without complication (HCC)    (1) A1C 7.7 (06/2018)  . Edema, peripheral 04/20/2014  . History of kidney stones   . Hyperlipidemia   . Hypertension   . Iron deficiency anemia 06/22/2014  . Junctional bradycardia    a. In setting of beta blocker therapy.  . Leucocytosis 10/19/2015  . Morbid obesity ([place])   . Overactive bladder   . Primary osteoarthritis of right knee 09/01/2016  . Sciatica 01/07/2015    Past  Surgical History:  Procedure Laterality Date  . APPENDECTOMY    . CESAREAN SECTION     x3  . CHOLECYSTECTOMY    . COLONOSCOPY WITH PROPOFOL N/A 08/28/2017   Procedure: COLONOSCOPY WITH PROPOFOL;  Surgeon: Lucilla Lame, MD;  Location: [facility] ENDOSCOPY;  Service: Endoscopy;  Laterality: N/A;  . COLONOSCOPY WITH PROPOFOL N/A 08/29/2017   Procedure: COLONOSCOPY WITH PROPOFOL;  Surgeon: Lucilla Lame, MD;  Location: [facility] ENDOSCOPY;  Service: Endoscopy;  Laterality: N/A;  . CYSTOSCOPY W/  URETERAL STENT PLACEMENT Right 09/15/2017   Procedure: CYSTOSCOPY WITH RETROGRADE PYELOGRAM/URETERAL STENT PLACEMENT;  Surgeon: Cleon Gustin, MD;  Location: [facility] ORS;  Service: Urology;  Laterality: Right;  . CYSTOSCOPY/URETEROSCOPY/HOLMIUM LASER/STENT PLACEMENT Right 10/09/2017   Procedure: CYSTOSCOPY/URETEROSCOPY/HOLMIUM LASER/STENT PLACEMENT;  Surgeon: Abbie Sons, MD;  Location: [facility] ORS;  Service: Urology;  Laterality: Right;  right Stent exchange  . ESOPHAGOGASTRODUODENOSCOPY (EGD) WITH PROPOFOL N/A 09/16/2018   Procedure: ESOPHAGOGASTRODUODENOSCOPY (EGD) WITH PROPOFOL;  Surgeon: Lin Landsman, MD;  Location: [place];  Service: Gastroenterology;  Laterality: N/A;  . EYE SURGERY      Social History:  reports that she quit smoking about 26 years ago. She has a 20.00 pack-year smoking history. She has never used smokeless tobacco. She reports that she does not drink alcohol or use drugs.  Family History:  Family History  Problem Relation Age of Onset  . Other Mother        unknown medical history  . Other Father        unknown medical history     Prior to Admission medications   Medication Sig Start Date End Date Taking? Authorizing Provider  albuterol (VENTOLIN HFA) 108 (90 Base) MCG/ACT inhaler INHALE 2 PUFFS BY MOUTH EVERY 6 HOURS AS NEEDED FOR WHEEZING 01/02/19   [provider]  apixaban (ELIQUIS) 5 MG TABS tablet Take 1 tablet (5 mg total) by mouth 2 (two) times daily. 04/09/19   [name], MD  atorvastatin (LIPITOR) 10 MG tablet Take 1 tablet by mouth daily.    [provider]  esomeprazole (NEXIUM) 40 MG capsule Take 40 mg by mouth daily. 12/17/18   [provider]  Ferrous Sulfate (IRON) 325 (65 Fe) MG TABS Take 1 tablet by mouth daily. 09/14/17   [provider]  fluticasone (FLONASE) 50 MCG/ACT nasal spray Place 2 sprays into both nostrils daily. 11/15/18   [provider]  Fluticasone-Umeclidin-Vilant  100-62.5-25 MCG/INH AEPB Inhale 1 puff into the lungs daily. 12/25/17   [provider]  furosemide (LASIX) 20 MG tablet Take 2 tablets (40 mg total) by mouth daily. 06/18/19   Alisa Graff, FNP  Insulin Human (INSULIN PUMP) SOLN Inject 1 each into the skin 3 times daily with meals, bedtime and 2 AM. Patient taking differently: Inject 1 each into the skin 3 times daily with meals, bedtime and 2 AM. Humalog Insulin 76 units daily 09/18/17   Dustin Flock, MD  ipratropium-albuterol (DUONEB) 0.5-2.5 (3) MG/3ML SOLN Inhale 3 mLs into the lungs 4 (four) times daily. 02/05/19   [provider]  losartan-hydrochlorothiazide (HYZAAR) 100-25 MG tablet Take 1 tablet by mouth daily. 01/05/19   [provider]  metFORMIN (GLUCOPHAGE) 500 MG tablet Take 1,000 mg by mouth 2 (two) times daily with a meal.     [provider]  montelukast (SINGULAIR) 10 MG tablet Take 10 mg by mouth at bedtime.    [provider]  potassium chloride (KLOR-CON) 10 MEQ tablet Take 1 tablet (  10 mEq total) by mouth daily. 02/28/19   Nolberto Hanlon, MD  vitamin B-12 (CYANOCOBALAMIN) 500 MCG tablet Take 500 mcg by mouth daily.    [provider]    Physical Exam: Vitals:   06/28/19 1000 06/28/19 1100 06/28/19 1130 06/28/19 1200  BP: (!) 136/57 136/75 (!) 137/52 (!) 149/70  Pulse: 80 85 86 88  Resp: 14 (!) 24 19 19   Temp:      TempSrc:      SpO2: 100% 100% 100% 97%  Weight:      Height:       General: Not in acute distress HEENT:       Eyes: PERRL, EOMI, no scleral icterus.       ENT: No discharge from the ears and nose, no pharynx injection, no tonsillar enlargement.        Neck: No JVD, no bruit, no mass felt. Heme: No neck lymph node enlargement. Cardiac: S1/S2, RRR, No murmurs, No gallops or rubs. Respiratory:  No rales, wheezing, rhonchi or rubs. GI: Soft, nondistended, nontender, no rebound pain, no organomegaly, BS present. GU: No hematuria Ext: No pitting leg  edema bilaterally. 2+DP/PT pulse bilaterally. Musculoskeletal: No joint deformities, No joint redness or warmth, no limitation of ROM in spin. Skin: No rashes.  Neuro: Alert, oriented X3, cranial nerves II-XII grossly intact, moves all extremities normally.  Psych: Patient is not psychotic, no suicidal or hemocidal ideation.  Labs on Admission: I have personally reviewed following labs and imaging studies  CBC: Recent Labs  Lab 06/28/19 0918 06/28/19 1133  WBC 13.4* 14.0*  NEUTROABS 10.9*  --   HGB 10.0* 9.3*  HCT 30.4* 27.8*  MCV 85.9 86.6  PLT 317 893   Basic Metabolic Panel: Recent Labs  Lab 06/28/19 0918  NA 127*  K 3.2*  CL 84*  CO2 31  GLUCOSE 283*  BUN 18  CREATININE 0.85  CALCIUM 8.6*   GFR: Estimated Creatinine Clearance: 63.5 mL/min (by C-G formula based on SCr of 0.85 mg/dL). Liver Function Tests: Recent Labs  Lab 06/28/19 0918  AST 16  ALT 18  ALKPHOS 60  BILITOT 0.8  PROT 6.6  ALBUMIN 3.6   No results for input(s): LIPASE, AMYLASE in the last 168 hours. No results for input(s): AMMONIA in the last 168 hours. Coagulation Profile: Recent Labs  Lab 06/28/19 0918  INR 1.1   Cardiac Enzymes: No results for input(s): CKTOTAL, CKMB, CKMBINDEX, TROPONINI in the last 168 hours. BNP (last 3 results) No results for input(s): PROBNP in the last 8760 hours. HbA1C: No results for input(s): HGBA1C in the last 72 hours. CBG: No results for input(s): GLUCAP in the last 168 hours. Lipid Profile: No results for input(s): CHOL, HDL, LDLCALC, TRIG, CHOLHDL, LDLDIRECT in the last 72 hours. Thyroid Function Tests: No results for input(s): TSH, T4TOTAL, FREET4, T3FREE, THYROIDAB in the last 72 hours. Anemia Panel: No results for input(s): VITAMINB12, FOLATE, FERRITIN, TIBC, IRON, RETICCTPCT in the last 72 hours. Urine analysis:    Component Value Date/Time   COLORURINE YELLOW (A) 06/01/2019 1212   APPEARANCEUR HAZY (A) 06/01/2019 1212   APPEARANCEUR  Cloudy (A) 09/28/2017 1338   LABSPEC 1.005 06/01/2019 1212   LABSPEC 1.024 04/02/2014 0517   PHURINE 6.0 06/01/2019 1212   GLUCOSEU NEGATIVE 06/01/2019 1212   GLUCOSEU >=500 04/02/2014 0517   HGBUR NEGATIVE 06/01/2019 1212   BILIRUBINUR NEGATIVE 06/01/2019 1212   BILIRUBINUR Negative 09/28/2017 1338   BILIRUBINUR Negative 04/02/2014 [place] 06/01/2019 1212  PROTEINUR NEGATIVE 06/01/2019 1212   NITRITE POSITIVE (A) 06/01/2019 1212   LEUKOCYTESUR MODERATE (A) 06/01/2019 1212   LEUKOCYTESUR Trace 04/02/2014 0517   Sepsis Labs: @LABRCNTIP (procalcitonin:4,lacticidven:4) )No results found for this or any previous visit (from the past 240 hour(s)).   Radiological Exams on Admission: No results found.   EKG:  Not done in ED, will get one.   Assessment/Plan Principal Problem:   Rectal bleeding Active Problems:   Leucocytosis   COPD (chronic obstructive pulmonary disease) (HCC)   Hypertension   Iron deficiency anemia   Chronic diastolic heart failure (HCC)   Hypokalemia   Hyponatremia   Type II diabetes mellitus with renal manifestations (HCC)   CKD (chronic kidney disease), stage IIIa   Atrial fibrillation, chronic (HCC)   Rectal bleeding: Hgb 10.9 --> 10.0 -->9.3.  Currently hemodynamically stable, but pt continues to have rectal bleeding. Patient has painless rectal bleeding, given history of diverticulosis, patient may have lower GI bleeding due to diverticular bleeding.  Patient is taking Eliquis, last dose was in this morning.  Dr. Allen Norris of GI is consulted.   - will admitted to progressive bed as inpatient - will get NM GI blood loss scan --> No scintigraphic evidence of active GI bleed. - GI consulted by Ed, will follow up recommendations - NPO  - IVF: 75 mL/hr - Start IV pantoprazole 40 mg bid - Zofran IV for nausea - Avoid NSAIDs and SQ heparin - Maintain IV access (2 large bore IVs if possible). - Monitor closely and follow q6h cbc, transfuse as  necessary, if Hgb<7.0 - LaB: INR, PTT and type screen  Leucocytosis: WBc 13.4. No fever.  No signs of infection.  Likely reactive -f/u by CBC  COPD (chronic obstructive pulmonary disease) ([place]): Stable, on 3 L nasal cannula oxygen at home, oxygen saturation 92% on home level oxygen. -Singulair -Continue bronchodilators  Hypertension: Blood pressure 129/56 -hold lasix and Hyzarr in case pt has worsening GI bleeding -As needed hydralazine IV  Iron deficiency anemia -Continue iron supplement  Chronic diastolic heart failure ([place]): 2D echo on 02/14/2019 showed EF 65-70%.  Patient does not have leg edema or JVD.  No worsening shortness of breath.  CHF seem to be compensated. -Hold Lasix due to hyponatremia and GI bleeding  Hypokalemia: K= 3.2 on admission. - Repleted - Check Mg level  Hyponatremia: Na 127, Most likely due to poor oral intake and dehydration, and the continuation of Lasix, Hyzaar -Hold Hyzaar and Lasix - Will check urine sodium, urine osmolality, serum osmolality. - check TSH - Fluid restriction -->pt is NPO now - IVF: 75 mL/h - f/u by BMP q8h - avoid over correction too fast due to risk of central pontine myelinolysis  Type II diabetes mellitus with renal manifestations ([place]): Most recent A1c 8.4, poorly controled. Patient is on insulin pump at home -Continue insulin pump  CKD (chronic kidney disease), stage IIIa: stable. Cre 0.85 and BUN 18 -f/u by BMP  Chronic A fib: Per patient's son, patient is taking Eliquis for atrial fibrillation.  The last dose of Eliquis is at this morning. -Hold Eliquis due to GI bleeding -Cardiac monitoring   DVT ppx: SCD Code Status: DNR (I discussed with patient with his son on the phone, and explained the meaning of CODE STATUS. Patient wants to be DNR) Family Communication: Yes, patient's son by phone Disposition Plan:  Anticipate discharge back to previous home environment Consults called:  Dr. Allen Norris of GI Admission status:  Progressive bed as inpt  Status is: Inpatient Remains inpatient appropriate because:Ongoing diagnostic testing needed not appropriate for outpatient work up Dispo: The patient is from: Home              Anticipated d/c is to: Home              Anticipated d/c date is: 2 days              Patient currently is not medically stable to d/c.   Inpatient status:  # Patient requires inpatient status due to high intensity of service, high risk for further deterioration and high frequency of surveillance required.  I certify that at the point of admission it is my clinical judgment that the patient will require inpatient hospital care spanning beyond 2 midnights from the point of admission.  . 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 Now patient has presenting with rectal bleeding . The initial radiographic and laboratory data are worrisome because of hyponatremia, hypokalemia, leukocytosis . Current medical needs: please see my assessment and plan . Predictability of an adverse outcome (risk): Patient has multiple comorbidities as listed above. Now presents with rectal bleeding. Patient's presentation is highly complicated.  Since patient is on blood thinner Eliquis, she is at high risk of developing massive GI bleeding. Patient is at high risk of deteriorating.  Will need to be treated in hospital for at least 2 days.           Date of Service 06/28/2019    [facility]   If 7PM-7AM, please contact night-coverage [email or web address] 06/28/2019, 12:18 PM

## 2019-06-28 NOTE — ED Notes (Signed)
Nuc Med paged regarding [facility] GI Blood loss study.

## 2019-06-29 DIAGNOSIS — I482 Chronic atrial fibrillation, unspecified: Secondary | ICD-10-CM

## 2019-06-29 DIAGNOSIS — K625 Hemorrhage of anus and rectum: Secondary | ICD-10-CM

## 2019-06-29 LAB — CBC
HCT: 26.7 % — ABNORMAL LOW (ref 36.0–46.0)
HCT: 25.4 % — ABNORMAL LOW (ref 36.0–46.0)
Hemoglobin: 8.7 g/dL — ABNORMAL LOW (ref 12.0–15.0)
Hemoglobin: 8.5 g/dL — ABNORMAL LOW (ref 12.0–15.0)
MCH: 28.4 pg (ref 26.0–34.0)
MCH: 28.9 pg (ref 26.0–34.0)
MCHC: 32.6 g/dL (ref 30.0–36.0)
MCHC: 33.5 g/dL (ref 30.0–36.0)
MCV: 87.3 fL (ref 80.0–100.0)
MCV: 86.4 fL (ref 80.0–100.0)
Platelets: 270 10*3/uL (ref 150–400)
Platelets: 268 10*3/uL (ref 150–400)
RBC: 3.06 MIL/uL — ABNORMAL LOW (ref 3.87–5.11)
RBC: 2.94 MIL/uL — ABNORMAL LOW (ref 3.87–5.11)
RDW: 14.6 % (ref 11.5–15.5)
RDW: 15.2 % (ref 11.5–15.5)
WBC: 13 10*3/uL — ABNORMAL HIGH (ref 4.0–10.5)
WBC: 12.6 10*3/uL — ABNORMAL HIGH (ref 4.0–10.5)
nRBC: 0 % (ref 0.0–0.2)
nRBC: 0 % (ref 0.0–0.2)

## 2019-06-29 LAB — URINALYSIS, COMPLETE (UACMP) WITH MICROSCOPIC
Bilirubin Urine: NEGATIVE
Glucose, UA: NEGATIVE mg/dL
Ketones, ur: NEGATIVE mg/dL
Nitrite: NEGATIVE
Protein, ur: NEGATIVE mg/dL
RBC / HPF: NONE SEEN RBC/hpf (ref 0–5)
Specific Gravity, Urine: 1.015 (ref 1.005–1.030)
WBC, UA: >50 WBC/hpf (ref 0–5)
pH: 7 (ref 5.0–8.0)

## 2019-06-29 LAB — BASIC METABOLIC PANEL
Anion gap: 10 (ref 5–15)
BUN: 14 mg/dL (ref 8–23)
CO2: 31 mmol/L (ref 22–32)
Calcium: 8.7 mg/dL — ABNORMAL LOW (ref 8.9–10.3)
Chloride: 92 mmol/L — ABNORMAL LOW (ref 98–111)
Creatinine, Ser: 0.75 mg/dL (ref 0.44–1.00)
GFR calc Af Amer: >60 mL/min (ref >60)
GFR calc non Af Amer: >60 mL/min (ref >60)
Glucose, Bld: 178 mg/dL — ABNORMAL HIGH (ref 70–99)
Potassium: 4.1 mmol/L (ref 3.5–5.1)
Sodium: 133 mmol/L — ABNORMAL LOW (ref 135–145)

## 2019-06-29 LAB — SODIUM, URINE, RANDOM: Sodium, Ur: 44 mmol/L

## 2019-06-29 LAB — TSH: TSH: 1.44 u[IU]/mL (ref 0.350–4.500)

## 2019-06-29 LAB — MAGNESIUM: Magnesium: 1.9 mg/dL (ref 1.7–2.4)

## 2019-06-29 MED ORDER — PANTOPRAZOLE SODIUM 40 MG PO TBEC
40.0000 mg | DELAYED_RELEASE_TABLET | Freq: Every day | ORAL | Status: DC
  Administered 2019-06-30: 40 mg via ORAL
  Filled 2019-06-29: qty 1

## 2019-06-29 NOTE — Progress Notes (Signed)
Inpatient Diabetes Program Recommendations  AACE/ADA: New Consensus Statement on Inpatient Glycemic Control (2015)  Target Ranges:  Prepandial:   less than 140 mg/dL      Peak postprandial:   less than 180 mg/dL (1-2 hours)      Critically ill patients:  140 - 180 mg/dL   Results for Karen [name], Karen [name] (MRN [id number]) as of 06/29/2019 09:55  Ref. Range 06/28/2019 13:15  Glucose-Capillary Latest Ref Range: 70 - 99 mg/dL 165 (H)   Admit with: Rectal Bleeding  History: DM, COPD, CKD3, CHF  Home DM Meds: Insulin Pump (VGo 40)       Metformin 1000 mg BID  Current Orders: Insulin Pump    Sent Secure Chat to RN caring for pt this AM.  Please remember to: Check pt's CBGs with hospital meter Chart all insulin boluses pt gives self with pump in the [facility] Chart Insulin Pump Assessment Qshift under Flowsheets     Endocrinologist: Malissa Hippo, NP with [facility] seen 06/17/2019--Was told the following: 1. Continue Metformin 1000 mg twicedaily, and Invokana 100 mg daily  2. Continue V-Go 40 plus 5,1,8 clicks for small, medium, large meal-take extra click if blood sugar over 170 before your meals,take 6 clicks if blood sugar is over 250 3. ContinueFreestyle    --Will follow patient during hospitalization--  Wyn Quaker RN, MSN, CDE Diabetes Coordinator Inpatient Glycemic Control Team Team Pager: [phone] (8a-5p)

## 2019-06-29 NOTE — Consult Note (Signed)
Lucilla Lame, MD [facility]  [address] [place],  [address] Phone: [phone] Fax : [phone]  Consultation  Referring Provider:     Dr. Blaine Hamper Primary Care Physician:  Ricardo Jericho, NP Primary Gastroenterologist:  Dr. Allen Norris         Reason for Consultation:     Rectal bleeding  Date of Admission:  06/28/2019 Date of Consultation:  06/29/2019         HPI:   Karen [name] is a 74 y.o. female who was admitted from the emergency department yesterday due to rectal bleeding.  The patient has a history of COPD and prior GI bleeds.  The patient had presented to the emergency room with bright red blood along with some dark clots.  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  She is presently on Eliquis.  She has had GI procedures in the past for bleeding and they showed:  EGD 09/2018 - A single non-bleeding angioectasia in the duodenum. Treated with argon plasma coagulation (APC). - Non-bleeding gastric ulcer with a nonbleeding visible vessel (Forrest Class IIa). Treated with bipolar cautery. - Non-bleeding gastric ulcers with a clean ulcer base (Forrest Class III). Biopsied. - Esophagogastric landmarks identified. - Normal gastroesophageal junction and esophagus  Colonoscopy 08/2017 - Diverticulosis in the descending colon, in the transverse colon and in the ascending colon. - Diverticulosis in the sigmoid colon. - Blood in the entire examined colon. - There was more blood seen on the left then on the right. - Likely a diverticulit bleed.  The patient underwent a bleeding scan yesterday that showed no evidence of active GI bleeding.  The patient denied any hematemesis in the ER.  Visualization of the stool by the ER physician reported no black stools but consistent with fresh blood.  The patient's blood counts since admission has shown:  Component     Latest Ref Rng & Units 06/28/2019 06/28/2019 06/28/2019 06/28/2019    9:18 AM 11:33 AM  1:45 PM  8:20 PM  Hemoglobin     12.0 - 15.0 g/dL 10.0 (L) 9.3 (L) 10.3 (L) 9.3 (L)  HCT     36.0 - 46.0 % 30.4 (L) 27.8 (L) 30.8 (L) 28.1 (L)   Component     Latest Ref Rng & Units 06/29/2019          Hemoglobin     12.0 - 15.0 g/dL 8.7 (L)  HCT     36.0 - 46.0 % 26.7 (L)   The patient denies any further bleeding since being in the ER.  She denies any abdominal pain associated with the bright red blood per rectum.  She denies any dizziness shortness of breath fevers or chills.  As reported above there has been a slight drop of her hemoglobin since yesterday.  Past Medical History:  Diagnosis Date  . (HFpEF) heart failure with preserved ejection fraction ([place]) 2017   a. 2017 Echo: EF 50%; b. 06/2018 Echo: EF 50-55%; c. 08/2018 Echo: EF 50-55%, Nl RV fxn. RVSP 60.101mmHg. Mild BAE. Mild to mod TR.     Marland Kitchen Acute on chronic respiratory failure with hypoxia and hypercapnia ([place]) 01/07/2015  . Anemia   . 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 Asthma   . Cataract   . CHF (congestive heart failure) ([place])   . CKD (chronic kidney disease), stage III   . COPD (chronic obstructive pulmonary disease) ([place])    (1) 06/2018 tobacco use, home  3L oxygen   . Diabetes mellitus without complication (HCC)    (1) A1C 7.7 (06/2018)  . Edema, peripheral 04/20/2014  . GI bleed 06/28/2019  . History of kidney stones   . Hyperlipidemia   . Hypertension   . Iron deficiency anemia 06/22/2014  . Junctional bradycardia    a. In setting of beta blocker therapy.  . Leucocytosis 10/19/2015  . Morbid obesity ([place])   . Overactive bladder   . Primary osteoarthritis of right knee 09/01/2016  . Sciatica 01/07/2015    Past Surgical History:  Procedure Laterality Date  . APPENDECTOMY    . CESAREAN SECTION     x3  . CHOLECYSTECTOMY    . COLONOSCOPY WITH PROPOFOL N/A 08/28/2017   Procedure: COLONOSCOPY WITH PROPOFOL;  Surgeon: Lucilla Lame, MD;  Location: [facility] ENDOSCOPY;  Service: Endoscopy;  Laterality: N/A;  .  COLONOSCOPY WITH PROPOFOL N/A 08/29/2017   Procedure: COLONOSCOPY WITH PROPOFOL;  Surgeon: Lucilla Lame, MD;  Location: [facility] ENDOSCOPY;  Service: Endoscopy;  Laterality: N/A;  . CYSTOSCOPY W/ URETERAL STENT PLACEMENT Right 09/15/2017   Procedure: CYSTOSCOPY WITH RETROGRADE PYELOGRAM/URETERAL STENT PLACEMENT;  Surgeon: Cleon Gustin, MD;  Location: [facility] ORS;  Service: Urology;  Laterality: Right;  . CYSTOSCOPY/URETEROSCOPY/HOLMIUM LASER/STENT PLACEMENT Right 10/09/2017   Procedure: CYSTOSCOPY/URETEROSCOPY/HOLMIUM LASER/STENT PLACEMENT;  Surgeon: Abbie Sons, MD;  Location: [facility] ORS;  Service: Urology;  Laterality: Right;  right Stent exchange  . ESOPHAGOGASTRODUODENOSCOPY (EGD) WITH PROPOFOL N/A 09/16/2018   Procedure: ESOPHAGOGASTRODUODENOSCOPY (EGD) WITH PROPOFOL;  Surgeon: Lin Landsman, MD;  Location: [facility];  Service: Gastroenterology;  Laterality: N/A;  . EYE SURGERY      Prior to Admission medications   Medication Sig Start Date End Date Taking? Authorizing Provider  albuterol (VENTOLIN HFA) 108 (90 Base) MCG/ACT inhaler INHALE 2 PUFFS BY MOUTH EVERY 6 HOURS AS NEEDED FOR WHEEZING 01/02/19  Yes [provider]  apixaban (ELIQUIS) 5 MG TABS tablet Take 1 tablet (5 mg total) by mouth 2 (two) times daily. 04/09/19  Yes [name], MD  atorvastatin (LIPITOR) 10 MG tablet Take 1 tablet by mouth daily.   Yes [provider]  esomeprazole (NEXIUM) 40 MG capsule Take 40 mg by mouth daily. 12/17/18  Yes [provider]  Ferrous Sulfate (IRON) 325 (65 Fe) MG TABS Take 1 tablet by mouth daily. 09/14/17  Yes [provider]  fluticasone (FLONASE) 50 MCG/ACT nasal spray Place 2 sprays into both nostrils daily. 11/15/18  Yes [provider]  Fluticasone-Umeclidin-Vilant 100-62.5-25 MCG/INH AEPB Inhale 1 puff into the lungs daily. 12/25/17  Yes [provider]  furosemide (LASIX) 20 MG tablet Take 2 tablets (40 mg total) by mouth  daily. 06/18/19  Yes Darylene Price A, FNP  Insulin Human (INSULIN PUMP) SOLN Inject 1 each into the skin 3 times daily with meals, bedtime and 2 AM. Patient taking differently: Inject 1 each into the skin 3 times daily with meals, bedtime and 2 AM. Humalog Insulin 0-76 units daily insulin pump 09/18/17  Yes Dustin Flock, MD  ipratropium-albuterol (DUONEB) 0.5-2.5 (3) MG/3ML SOLN Inhale 3 mLs into the lungs 4 (four) times daily. 02/05/19  Yes [provider]  losartan-hydrochlorothiazide (HYZAAR) 100-25 MG tablet Take 1 tablet by mouth daily. 01/05/19  Yes [provider]  metFORMIN (GLUCOPHAGE) 500 MG tablet Take 1,000 mg by mouth 2 (two) times daily with a meal.    Yes [provider]  montelukast (SINGULAIR) 10 MG tablet Take 10 mg by mouth at bedtime.  Yes [provider]  potassium chloride (KLOR-CON) 10 MEQ tablet Take 1 tablet (10 mEq total) by mouth daily. 02/28/19  Yes Nolberto Hanlon, MD  vitamin B-12 (CYANOCOBALAMIN) 500 MCG tablet Take 500 mcg by mouth daily.   Yes [provider]    Family History  Problem Relation Age of Onset  . Other Mother        unknown medical history  . Other Father        unknown medical history     Social History   Tobacco Use  . Smoking status: Former Smoker    Packs/day: 1.00    Years: 20.00    Pack years: 20.00    Quit date: 12/04/1992    Years since quitting: 26.5  . Smokeless tobacco: Never Used  Substance Use Topics  . Alcohol use: No  . Drug use: No    Allergies as of 06/28/2019 - Review Complete 06/28/2019  Allergen Reaction Noted  . Ace inhibitors Hives 01/07/2015  . Beta adrenergic blockers  02/14/2019  . Gabapentin Hives 01/07/2015  . Lisinopril Hives 01/07/2015  . Lyrica [pregabalin] Hives 08/17/2015  . Shrimp [shellfish allergy] Swelling 10/04/2017    Review of Systems:    All systems reviewed and negative except where noted in HPI.   Physical Exam:  Vital signs in last 24  hours: Temp:  [97.5 F (36.4 C)-98.5 F (36.9 C)] 97.5 F (36.4 C) (04/25 0816) Pulse Rate:  [72-89] 79 (04/25 0816) Resp:  [14-24] 19 (04/25 0816) BP: (105-149)/(40-75) 137/45 (04/25 0816) SpO2:  [94 %-100 %] 95 % (04/25 0816) FiO2 (%):  [32 %] 32 % (04/24 1912) Weight:  [97.1 kg-98.7 kg] 98.7 kg (04/25 0618)   General:   Pleasant, cooperative in NAD Head:  Normocephalic and atraumatic. Eyes:   No icterus.   Conjunctiva pink. PERRLA. Ears:  Normal auditory acuity. Neck:  Supple; no masses or thyroidomegaly Lungs: Respirations even and unlabored. Lungs clear to auscultation bilaterally.   No wheezes, crackles, or rhonchi.  Heart:  Regular rate and rhythm;  Without murmur, clicks, rubs or gallops Abdomen:  Soft, nondistended, nontender. Normal bowel sounds. No appreciable masses or hepatomegaly.  No rebound or guarding.  Rectal:  Not performed. Msk:  Symmetrical without gross deformities.    Extremities:  Without edema, cyanosis or clubbing. Neurologic:  Alert and oriented x3;  grossly normal neurologically. Skin:  Intact without significant lesions or rashes. Cervical Nodes:  No significant cervical adenopathy. Psych:  Alert and cooperative. Normal affect.  LAB RESULTS: Recent Labs    06/28/19 1345 06/28/19 2020 06/29/19 0220  WBC 16.4* 13.4* 13.0*  HGB 10.3* 9.3* 8.7*  HCT 30.8* 28.1* 26.7*  PLT 301 290 270   BMET Recent Labs    06/28/19 1346 06/28/19 2020 06/29/19 0220  NA 133* 132* 133*  K 3.5 3.5 4.1  CL 89* 90* 92*  CO2 31 31 31   GLUCOSE 145* 149* 178*  BUN 17 15 14   CREATININE 0.73 0.78 0.75  CALCIUM 9.1 8.7* 8.7*   LFT Recent Labs    06/28/19 0918  PROT 6.6  ALBUMIN 3.6  AST 16  ALT 18  ALKPHOS 60  BILITOT 0.8   PT/INR Recent Labs    06/28/19 0918  LABPROT 13.9  INR 1.1    STUDIES: NM GI Blood Loss  Result Date: 06/28/2019 CLINICAL DATA:  74 year old female with lower GI bleed. EXAM: NUCLEAR MEDICINE GASTROINTESTINAL BLEEDING SCAN  TECHNIQUE: Sequential abdominal images were obtained following intravenous administration of Tc-14m  labeled red blood cells. RADIOPHARMACEUTICALS:  22.757 mCi Tc-31m pertechnetate in-vitro labeled red cells. COMPARISON:  CT of the pelvis dated 09/14/2018. FINDINGS: There is physiologic activity related to cardiac blood pool, liver uptake as well as vasculature. There is excretion of radiopharmaceutical into the bladder. No activity identified conforming to the bowel to suggest active GI bleed. IMPRESSION: No scintigraphic evidence of active GI bleed. Electronically Signed   By: Anner Crete M.D.   On: 06/28/2019 17:32      Impression / Plan:   Assessment: Principal Problem:   Rectal bleeding Active Problems:   Leucocytosis   COPD (chronic obstructive pulmonary disease) (HCC)   Hypertension   Iron deficiency anemia   Chronic diastolic heart failure (HCC)   Hypokalemia   Hyponatremia   Type II diabetes mellitus with renal manifestations (HCC)   CKD (chronic kidney disease), stage IIIa   Atrial fibrillation, chronic (HCC)   SHYONNA CARLIN is a 74 y.o. y/o female with recurrent lower GI bleeding with a history of diverticulosis in the transverse a sending descending and sigmoid colon.  There was a colonoscopy that showed more blood on the left than on the right back in 2019.  It was consistent with a left-sided diverticular bleed.  As stated above the patient has had no further GI bleeding since being admitted.  The patient also had a negative GI bleeding scan yesterday.  Plan:  This is likely recurrent lower GI bleeding caused by diverticulosis.  Diverticular bleeds are not amenable to endoscopic surgery and if the bleeding should recur the patient should have a repeat bleeding scan and if positive vascular surgery should be contacted for intravascular embolization of the bleeding site if seen.  There is nothing to offer from a GI point of view at this time.  I will sign off.  Please  call if any further GI concerns or questions.  We would like to thank you for the opportunity to participate in the care of Karen [name].    Thank you for involving me in the care of this patient.      LOS: 1 day   Lucilla Lame, MD  06/29/2019, 8:22 AM Pager [phone] 7am-5pm  Check AMION for 5pm -7am coverage and on weekends   Note: This dictation was prepared with Dragon dictation along with smaller phrase technology. Any transcriptional errors that result from this process are unintentional.

## 2019-06-29 NOTE — Progress Notes (Signed)
[place] [facility] [place] NAME: Cecily Lawhorne    MR#:  [id number]  DATE OF BIRTH:  1945/10/12  SUBJECTIVE:  patient came in from home with rectal bleeding. None today. Feels hungry wants to eat. No hematemesis or vomiting denies abdominal pain no family in the room REVIEW OF SYSTEMS:   Review of Systems  Constitutional: Negative for chills, fever and weight loss.  HENT: Negative for ear discharge, ear pain and nosebleeds.   Eyes: Negative for blurred vision, pain and discharge.  Respiratory: Negative for sputum production, shortness of breath, wheezing and stridor.   Cardiovascular: Negative for chest pain, palpitations, orthopnea and PND.  Gastrointestinal: Negative for abdominal pain, diarrhea, nausea and vomiting.  Genitourinary: Negative for frequency and urgency.  Musculoskeletal: Negative for back pain and joint pain.  Neurological: Positive for weakness. Negative for sensory change, speech change and focal weakness.  Psychiatric/Behavioral: Negative for depression and hallucinations. The patient is not nervous/anxious.    Tolerating Diet:yes Tolerating PT: ambulatory at home with walker/cane  DRUG ALLERGIES:   Allergies  Allergen Reactions  . Ace Inhibitors Hives  . Beta Adrenergic Blockers     Junctional bradycardia  . Gabapentin Hives  . Lisinopril Hives  . Lyrica [Pregabalin] Hives  . Shrimp [Shellfish Allergy] Swelling    Swelling of the lips    VITALS:  Blood pressure (!) 143/61, pulse 80, temperature 97.7 F (36.5 C), temperature source Oral, resp. rate 18, height 5\' 3"  (1.6 m), weight 98.7 kg, SpO2 99 %.  PHYSICAL EXAMINATION:   Physical Exam  GENERAL:  74 y.o.-year-old patient lying in the bed with no acute distress. Obese EYES: Pupils equal, round, reactive to light and accommodation. No scleral icterus.   HEENT: Head atraumatic, normocephalic. Oropharynx and nasopharynx clear.  NECK:  Supple, no jugular  venous distention. No thyroid enlargement, no tenderness.  LUNGS: Normal breath sounds bilaterally, no wheezing, rales, rhonchi. No use of accessory muscles of respiration.  CARDIOVASCULAR: S1, S2 normal. No murmurs, rubs, or gallops.  ABDOMEN: Soft, nontender, nondistended. Bowel sounds present. No organomegaly or mass.  EXTREMITIES: No cyanosis, clubbing or edema b/l.    NEUROLOGIC: Cranial nerves II through XII are intact. No focal Motor or sensory deficits b/l.   PSYCHIATRIC:  patient is alert and oriented x 3.  SKIN: No obvious rash, lesion, or ulcer.   LABORATORY PANEL:  CBC Recent Labs  Lab 06/29/19 0220  WBC 13.0*  HGB 8.7*  HCT 26.7*  PLT 270    Chemistries  Recent Labs  Lab 06/28/19 0918 06/28/19 1346 06/29/19 0220  NA 127*   < > 133*  K 3.2*   < > 4.1  CL 84*   < > 92*  CO2 31   < > 31  GLUCOSE 283*   < > 178*  BUN 18   < > 14  CREATININE 0.85   < > 0.75  CALCIUM 8.6*   < > 8.7*  MG  --   --  1.9  AST 16  --   --   ALT 18  --   --   ALKPHOS 60  --   --   BILITOT 0.8  --   --    < > = values in this interval not displayed.   Cardiac Enzymes No results for input(s): TROPONINI in the last 168 hours. RADIOLOGY:  NM GI Blood Loss  Result Date: 06/28/2019 CLINICAL DATA:  74 year old female with lower GI bleed. EXAM:  NUCLEAR MEDICINE GASTROINTESTINAL BLEEDING SCAN TECHNIQUE: Sequential abdominal images were obtained following intravenous administration of Tc-62m labeled red blood cells. RADIOPHARMACEUTICALS:  22.757 mCi Tc-71m pertechnetate in-vitro labeled red cells. COMPARISON:  CT of the pelvis dated 09/14/2018. FINDINGS: There is physiologic activity related to cardiac blood pool, liver uptake as well as vasculature. There is excretion of radiopharmaceutical into the bladder. No activity identified conforming to the bowel to suggest active GI bleed. IMPRESSION: No scintigraphic evidence of active GI bleed. Electronically Signed   By: Anner Crete M.D.   On:  06/28/2019 17:32   ASSESSMENT AND PLAN:  CATELIN MANTHE is a 74 y.o. 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  Rectal bleeding: suspected this is diverticular bleed.  Hgb 10.9 --> 10.0 -->9.3--8.7.  - Currently hemodynamically stable -Patient has painless rectal bleeding, given history of diverticulosis, patient may have lower GI bleeding due to diverticular bleeding.   -Patient is taking Eliquis, last dose was in this morning.   -Dr. Allen Norris of GI is consulted-- she had recommendations. G.I. signed off. Continue to monitor. Slight dip in hemoglobin transfuse as needed. Some could be delusional given IV fluids -patient denies any more rectal bleed. She is hungry wants to eat. -  NM GI blood loss scan --> No scintigraphic evidence of active GI bleed. -- Zofran IV for nausea -- Avoid NSAIDs and SQ heparin  -- EGD in July 2020 showed single non-bleeding angioectasia the duodenum treated with APC. Non-bleeding gastric ulcer with a nonbleeding visible vessel (Forrest Class IIa). Treated with bipolar cautery. - Non-bleeding gastric ulcers with a clean ulcer base (Forrest Class III). Biopsied. - Esophagogastric landmarks identified.  --Colonoscopy 08/2017 - Diverticulosis in the descending colon, in the transverse colon and in the ascending colon. - Diverticulosis in the sigmoid colon. - Blood in the entire examined colon. - There was more blood seen on the left then on the right. - Likely a diverticular bleed   Leucocytosis: WBc 13.4. No fever.  No signs of infection.  Likely reactive -f/u by CBC  COPD (chronic obstructive pulmonary disease) ([place]): Stable, on 3 L nasal cannula oxygen at home, oxygen saturation 92% on home level  oxygen. -Singulair -Continue bronchodilators  Hypertension: Blood pressure 129/56 -hold lasix and Hyzarr in case pt has worsening GI bleeding -As needed hydralazine IV  Iron deficiency anemia -Continue iron supplement  Chronic diastolic heart failure ([place]): 2D echo on 02/14/2019 showed EF 65-70%.  Patient does not have leg edema or JVD.  No worsening shortness of breath.  CHF seem to be compensated. -Hold Lasix due to hyponatremia and GI bleeding -- sodium must improved  Hypokalemia: K= 3.2 on admission. - Repleted - Check Mg level  Hyponatremia: Na 127, Most likely due to poor oral intake and dehydration, and the continuation of Lasix, Hyzaar -Hold Hyzaar and Lasix -- sodium much improved to 133 -- DC IV fluids.  Type II diabetes mellitus with renal manifestations ([place]): Most recent A1c 8.4, poorly controled. Patient is on insulin pump and metformin  at home -Continue insulin pump  CKD (chronic kidney disease), stage IIIa: stable. Cre 0.85 and BUN 18 -f/u by BMP  Chronic A fib: Per patient's son, patient is taking Eliquis for atrial fibrillation.  The last dose of Eliquis is at this morning. -  Hold Eliquis due to GI bleeding -will resume eliquis at discharge.   DVT ppx: SCD Code Status: DNR ( Dr Blaine Hamper discussed with patient with his son on the phone, and explained the meaning of CODE STATUS. Patient wants to be DNR) Family Communication: Yes, patient's son by phone Disposition Plan:  Anticipate discharge back to previous home environment Consults called:  Dr. Allen Norris of GI Admission status: Progressive bed as inpt     Status is: Inpatient Remains inpatient appropriate because:Ongoing diagnostic testing needed not appropriate for outpatient work up Dispo: The patient is from: Home  Anticipated d/c is to: Home  Anticipated d/c date is: Monday, April 26  Patient currently is not medically stable to d/c... 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  >50% time spent on counselling and coordination of care  Note: This dictation was prepared with Dragon dictation along with smaller phrase technology. Any transcriptional errors that result from this process are unintentional.  Fritzi Mandes M.D    [facility]   CC: Primary care physician; Ricardo Jericho, NPPatient ID: JESYCA [place]ISENBURGER, female   DOB: [birth year], 74 y.o.   MRN: [id number]

## 2019-06-29 NOTE — Progress Notes (Signed)
PT Cancellation Note  Patient Details Name: Karen [name] MRN: [id number] DOB: August 26, 1945   Cancelled Treatment:    Reason Eval/Treat Not Completed: Patient declined, no reason specified, Patient reports having a head ache and that she doesn't feel up to a PT  evaluation.   [address] [place], [place] DPT 06/29/2019, 10:09 AM

## 2019-06-29 NOTE — Evaluation (Signed)
Occupational Therapy Evaluation Patient Details Name: Karen [name] MRN: [id number] DOB: 12-27-1945 Today's Date: 06/29/2019    History of Present Illness Karen [name] is a 74 y.o. 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   Clinical Impression   Karen [name] was seen for OT evaluation this date. Prior to hospital admission, pt was MOD I using RW for mobility and ADLs. Pt lives c husband and son and uses 3L O2 at baseline. Pt presents to acute OT demonstrating impaired ADL performance and functional mobility 2/2 functional strength/endurance deficits and pain. Limited evaluation this date 2/2 pt reporting headache (pain medication prior to session to address) and deferring bed/OOB mobility this date. Pt currently requires SETUP for tooth brushing and face washing long sitting in bed. Pt is NPO but is independnet for self-feeding ice chips long sitting in bed. Pt would benefit from skilled OT to address noted impairments and functional limitations (see below for any additional details) in order to maximize safety and independence while minimizing falls risk and caregiver burden. Upon hospital discharge, recommend HHOT to maximize pt safety and return to functional independence during meaningful occupations of daily life.     Follow Up Recommendations  Home health OT;Supervision/Assistance - 24 hour    Equipment Recommendations       Recommendations for Other Services       Precautions / Restrictions Precautions Precautions: Fall Restrictions Weight Bearing Restrictions: No      Mobility Bed Mobility               General bed mobility comments: Pt deferred bed mobility this date stating she had a headache and did not want to  move with therapy  Transfers                      Balance                                           ADL either performed or assessed with clinical judgement   ADL Overall ADL's : Needs assistance/impaired                                       General ADL Comments: SETUP tooth brushing and face washing long sitting in bed. Pt is NPO but Independnet for self-feeding ice chips      Vision         Perception     Praxis      Pertinent Vitals/Pain Pain Assessment: Faces Faces Pain Scale: Hurts little more Pain Location: headache Pain Descriptors / Indicators: Headache Pain Intervention(s): Premedicated before session;Limited activity within patient's tolerance     Hand Dominance Right   Extremity/Trunk Assessment Upper Extremity Assessment Upper Extremity Assessment: Generalized weakness   Lower Extremity Assessment Lower Extremity Assessment: Generalized weakness       Communication Communication Communication: No difficulties   Cognition Arousal/Alertness: Awake/alert Behavior During Therapy: WFL for tasks assessed/performed Overall Cognitive Status: Within Functional Limits for tasks assessed  General Comments       Exercises Exercises: Other exercises Other Exercises Other Exercises: Pt educated re: OT role, d/c recommendations, falls prevention, energy conservation, importance of mobility for functional strengthening Other Exercises: Tooth brushing, face washing, diaphragmic breathing   Shoulder Instructions      Home Living Family/patient expects to be discharged to:: Private residence Living Arrangements: Spouse/significant other;Children(husband and adult son) Available Help at Discharge: Family;Available 24 hours/day Type of Home: Mobile home Home Access: Ramped entrance     Home Layout: One level     Bathroom Shower/Tub: Animal nutritionist: Handicapped height Bathroom Accessibility: Yes   Home Equipment: Environmental consultant - 2 wheels;Cane - single point;Grab bars - tub/shower          Prior Functioning/Environment Level of Independence: Independent with assistive device(s)        Comments: ambulating with RW, pt denies falls in last 6 months, husband does cooking and housework,        OT Problem List: Decreased strength;Decreased activity tolerance;Decreased knowledge of use of DME or AE      OT Treatment/Interventions: Self-care/ADL training;Therapeutic exercise;Neuromuscular education;Energy conservation;DME and/or AE instruction;Therapeutic activities;Patient/family education;Balance training    OT Goals(Current goals can be found in the care plan section) Acute Rehab OT Goals Patient Stated Goal: To eat OT Goal Formulation: With patient Time For Goal Achievement: 07/13/19 Potential to Achieve Goals: Good ADL Goals Pt Will Perform Grooming: with modified independence;sitting Pt Will Perform Lower Body Dressing: with min assist;sit to/from stand;with caregiver independent in assisting(c LRAD PRN) Pt Will Transfer to Toilet: with min assist;bedside commode;ambulating(c LRAD PRN)  OT Frequency: Min 2X/week   Barriers to D/C: Inaccessible home environment          Co-evaluation              AM-PAC OT "6 Clicks" Daily Activity     Outcome Measure Help from another person eating meals?: None Help from another person taking care of personal grooming?: A Little Help from another person toileting, which includes using toliet, bedpan, or urinal?: A Little Help from another person bathing (including washing, rinsing, drying)?: A Lot Help from another person to put on and taking off regular upper body clothing?: A Little Help from another person to put on and taking off regular lower body clothing?: A Lot 6 Click Score: 17   End of Session Equipment Utilized During Treatment: Oxygen(3L  Karen [name])  Activity Tolerance: Patient limited by pain Patient left: in bed;with call bell/phone within reach;with bed alarm set  OT Visit Diagnosis: Unsteadiness on feet (R26.81);Other abnormalities of gait and mobility (R26.89)                Time: 0947-0962(8 min non billable time for MD discussion ) OT Time Calculation (min): 22 min Charges:  OT General Charges $OT Visit: 1 Visit OT Evaluation $OT Eval Low Complexity: 1 Low OT Treatments $Self Care/Home Management : 8-22 mins  Dessie Coma, M.S. OTR/L  06/29/19, 2:24 PM

## 2019-06-30 NOTE — Discharge Instructions (Signed)
Pt advised to return to ER if she notices large bright red blood in her stools

## 2019-06-30 NOTE — Progress Notes (Signed)
Written and verbal discharge instructions discussed with pt. Discussed medication, follow-up appts and when to call MD or return to ER. Pt verbalized understanding. IV and tele dc'd. Belongings with pt including her insulin pump supplies that were in the refridgerator. Pt had her own O2-3L. Pt to car via wheelchair by RN where spouse was waiting.

## 2019-06-30 NOTE — Progress Notes (Signed)
PT Cancellation Note  Patient Details Name: Karen [name] MRN: [id number] DOB: 1945-12-21   Cancelled Treatment:    Reason Eval/Treat Not Completed: Other (comment). Per Dr. Posey Pronto, pt is stable for discharge and has no difficulty with mobilization. Per MD, does not need further PT assessment. Will cancel current PT order at this time.   Harrington Jobe 06/30/2019, 9:06 AM  Greggory Stallion, PT, DPT [phone]

## 2019-06-30 NOTE — Discharge Summary (Signed)
[facility] [place] NAME: Karen [name]    MR#:  [id number]  DATE OF BIRTH:  1945/04/28  DATE OF ADMISSION:  06/28/2019 ADMITTING PHYSICIAN: Ivor Costa, MD  DATE OF DISCHARGE: 06/30/2019  PRIMARY CARE PHYSICIAN: Ricardo Jericho, NP    ADMISSION DIAGNOSIS:  Rectal bleeding [K62.5] Lower GI bleed [K92.2]  DISCHARGE DIAGNOSIS:  Rectal bleeding suspected Diverticular bleed Acute on chronic anemia due to GI bleed SECONDARY DIAGNOSIS:   Past Medical History:  Diagnosis Date  . (HFpEF) heart failure with preserved ejection fraction ([place]) 2017   a. 2017 Echo: EF 50%; b. 06/2018 Echo: EF 50-55%; c. 08/2018 Echo: EF 50-55%, Nl RV fxn. RVSP 60.75mmHg. Mild BAE. Mild to mod TR.     Marland Kitchen Acute on chronic respiratory failure with hypoxia and hypercapnia ([place]) 01/07/2015  . Anemia   . 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 Asthma   . Cataract   . CHF (congestive heart failure) ([place])   . CKD (chronic kidney disease), stage III   . COPD (chronic obstructive pulmonary disease) ([place])    (1) 06/2018 tobacco use, home 3L oxygen   . Diabetes mellitus without complication (HCC)    (1) A1C 7.7 (06/2018)  . Edema, peripheral 04/20/2014  . GI bleed 06/28/2019  . History of kidney stones   . Hyperlipidemia   . Hypertension   . Iron deficiency anemia 06/22/2014  . Junctional bradycardia    a. In setting of beta blocker therapy.  . Leucocytosis 10/19/2015  . Morbid obesity ([place])   . Overactive bladder   . Primary osteoarthritis of right knee 09/01/2016  . 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  Rectal bleeding: suspected this is diverticular  bleed. Hgb 10.9 -->10.0 -->9.3--8.7. -Currently hemodynamically stable -Patient has painless rectal bleeding,given history of diverticulosis, patient may have lower GI bleeding due to diverticular bleeding.  -Patient is taking Eliquis, last dose was in this morning.--now resumed. Pt understands the risk of GI bleeding, avoid constipation  -Dr. Francisco Capuchin is consulted-- she had recommendations. G.I. signed off.  Slight dip in hemoglobin transfuse as needed. Some could be delusional given IV fluids -patient denies any more rectal bleed. Tolerating po diet --NM GI blood loss scan-->No scintigraphic evidence of active GI bleed. -- Zofran IV for nausea -- Avoid NSAIDs and SQ heparin  -- EGD in July 2020 showed single non-bleeding angioectasia the duodenum treated with APC. Non-bleeding gastric ulcer with a nonbleeding visible vessel ([place] Class IIa). Treated with bipolar cautery. - Non-bleeding gastric ulcers with a clean ulcer base ([place] Class III). Biopsied. - Esophagogastric landmarks identified. --Colonoscopy 08/2017 - Diverticulosis in the descending colon, in the transverse colon and in the ascending colon. - Diverticulosis in the sigmoid colon. - Blood in the entire examined colon. - There was more blood seen on the left then on the right. - Likely a diverticular bleed   Leucocytosis:WBc 13.4. No fever. No signs of infection. Likely reactive  COPD (chronic obstructive pulmonary disease) ([place]):  -Stable,on 3 L nasal cannula oxygen at home, oxygen saturation 92% on home level oxygen. -Singulair -Continue bronchodilators  Hypertension:Blood pressure 129/56 -resumed  lasix and Hyzarr in case pt hasworsening GI bleeding -As needed hydralazine IV  Iron deficiency anemia -Continue iron supplement  Chronic diastolic heart failure (FTD):3U echo on 02/14/2019 showed EF 65-70%. Patient does not have leg edema or JVD. No worsening shortness of breath. CHF  seem to be compensated. -- sodium much improved  Hypokalemia: K=3.2on admission. - Repleted K 4.1 - Mg level 1.9  Hyponatremia:Na 127,Most likely due to poor oral intake and dehydration,and the continuation of Lasix, Hyzaar --- sodium much improved to 133 -- DC IV fluids. resume home meds  Type II diabetes mellitus with renal manifestations ([place]): -Most recent A1c8.4, poorly controled. Patient ison insulin pump and metformin at home -Continue insulin pump  CKD (chronic kidney disease), stage IIIa: stable. Creat 0.85 and BUN 18  Chronic A fib:Per patient's son, patient is taking Eliquisforatrial fibrillation.The last dose of Eliquis is at this morning. -will resume eliquis at discharge.pt understands risk of bleeding.   DVT ppx:now back on eliquis Code Status:DNR( Dr Blaine Hamper discussed with patientwith his son on the phone,and explained the meaning of CODE STATUS. Patient wants to be DNR) Family Communication: pt pt family aware Disposition Plan: discharge back to previous home environment today Consults called:Dr. Allen Norris of GI Admission status:Progressivebed as inpt    Status is: Inpatient Dispo: The patient is from:Home Anticipated d/c is KG:URKY Anticipated d/c date is: Monday, April 26 Patient currently medically stable to d/c CONSULTS OBTAINED:    DRUG ALLERGIES:   Allergies  Allergen Reactions  . Ace Inhibitors Hives  . Beta Adrenergic Blockers     Junctional bradycardia  . Gabapentin Hives  . Lisinopril Hives  . Lyrica [Pregabalin] Hives  . Shrimp [Shellfish Allergy] Swelling    Swelling of the lips    DISCHARGE MEDICATIONS:   Allergies as of 06/30/2019      Reactions   Ace Inhibitors Hives   Beta Adrenergic Blockers    Junctional bradycardia   Gabapentin Hives   Lisinopril Hives   Lyrica [pregabalin] Hives   Shrimp [shellfish Allergy] Swelling   Swelling of the lips       Medication List    TAKE these medications   albuterol 108 (90 Base) MCG/ACT inhaler Commonly known as: VENTOLIN HFA INHALE 2 PUFFS BY MOUTH EVERY 6 HOURS AS NEEDED FOR WHEEZING   apixaban 5 MG Tabs tablet Commonly known as: ELIQUIS Take 1 tablet (5 mg total) by mouth 2 (two) times daily.   atorvastatin 10 MG tablet Commonly known as: LIPITOR Take 1 tablet by mouth daily.   esomeprazole 40 MG capsule Commonly known as: NEXIUM Take 40 mg by mouth daily.   fluticasone 50 MCG/ACT nasal spray Commonly known as: FLONASE Place 2 sprays into both nostrils daily.   Fluticasone-Umeclidin-Vilant 100-62.5-25 MCG/INH Aepb Inhale 1 puff into the lungs daily.   furosemide 20 MG tablet Commonly known as: LASIX Take 2 tablets (40 mg total) by mouth daily.   insulin pump Soln Inject 1 each into the skin 3 times daily with meals, bedtime and 2 AM. What changed: additional instructions   ipratropium-albuterol 0.5-2.5 (3) MG/3ML Soln Commonly known as: DUONEB Inhale 3 mLs into the lungs 4 (four) times daily.   Iron 325 (65 Fe) MG Tabs Take 1 tablet by mouth daily.   losartan-hydrochlorothiazide 100-25 MG tablet Commonly known as: HYZAAR Take 1 tablet by mouth daily.   metFORMIN 500 MG tablet Commonly known as: GLUCOPHAGE Take 1,000 mg by mouth 2 (two) times daily with a meal.   montelukast 10 MG tablet Commonly known as: SINGULAIR Take 10 mg by mouth at bedtime.  potassium chloride 10 MEQ tablet Commonly known as: KLOR-CON Take 1 tablet (10 mEq total) by mouth daily.   vitamin B-12 500 MCG tablet Commonly known as: CYANOCOBALAMIN Take 500 mcg by mouth daily.       If you experience worsening of your admission symptoms, develop shortness of breath, life threatening emergency, suicidal or homicidal thoughts you must seek medical attention immediately by calling 911 or calling your MD immediately  if symptoms less severe.  You Must read complete instructions/literature  along with all the possible adverse reactions/side effects for all the Medicines you take and that have been prescribed to you. Take any new Medicines after you have completely understood and accept all the possible adverse reactions/side effects.   Please note  You were cared for by a hospitalist during your hospital stay. If you have any questions about your discharge medications or the care you received while you were in the hospital after you are discharged, you can call the unit and asked to speak with the hospitalist on call if the hospitalist that took care of you is not available. Once you are discharged, your primary care physician will handle any further medical issues. Please note that NO REFILLS for any discharge medications will be authorized once you are discharged, as it is imperative that you return to your primary care physician (or establish a relationship with a primary care physician if you do not have one) for your aftercare needs so that they can reassess your need for medications and monitor your lab values. Today   SUBJECTIVE   I am ready to go home. No rectal bleed per pt No new issues per RN  VITAL SIGNS:  Blood pressure (!) 157/67, pulse 81, temperature 97.9 F (36.6 C), temperature source Oral, resp. rate 19, height 5\' 3"  (1.6 m), weight 98.4 kg, SpO2 96 %.  I/O:    Intake/Output Summary (Last 24 hours) at 06/30/2019 0827 Last data filed at 06/29/2019 2335 Gross per 24 hour  Intake 1180 ml  Output 1600 ml  Net -420 ml    PHYSICAL EXAMINATION:  GENERAL:  74 y.o.-year-old patient lying in the bed with no acute distress. Obese EYES: Pupils equal, round, reactive to light and accommodation. No scleral icterus.  HEENT: Head atraumatic, normocephalic. Oropharynx and nasopharynx clear. Pallor+ NECK:  Supple, no jugular venous distention. No thyroid enlargement, no tenderness.  LUNGS: Normal breath sounds bilaterally, no wheezing, rales,rhonchi or crepitation. No use  of accessory muscles of respiration.  CARDIOVASCULAR: S1, S2 normal. No murmurs, rubs, or gallops.  ABDOMEN: Soft, non-tender, non-distended.Abdominal obesity. Bowel sounds present. No organomegaly or mass.  EXTREMITIES: No pedal edema, cyanosis, or clubbing.  NEUROLOGIC: Cranial nerves II through XII are intact. Muscle strength 5/5 in all extremities. Sensation intact. Gait not checked.  PSYCHIATRIC: patient is alert and oriented x 3.  SKIN: No obvious rash, lesion, or ulcer.   DATA REVIEW:   CBC  Recent Labs  Lab 06/29/19 1749  WBC 12.6*  HGB 8.5*  HCT 25.4*  PLT 268    Chemistries  Recent Labs  Lab 06/28/19 0918 06/28/19 1346 06/29/19 0220  NA 127*   < > 133*  K 3.2*   < > 4.1  CL 84*   < > 92*  CO2 31   < > 31  GLUCOSE 283*   < > 178*  BUN 18   < > 14  CREATININE 0.85   < > 0.75  CALCIUM 8.6*   < > 8.7*  MG  --   --  1.9  AST 16  --   --   ALT 18  --   --   ALKPHOS 60  --   --   BILITOT 0.8  --   --    < > = values in this interval not displayed.    Microbiology Results   Recent Results (from the past 240 hour(s))  Respiratory Panel by RT PCR (Flu A&B, Covid) - Nasopharyngeal Swab     Status: None   Collection Time: 06/28/19 10:55 AM   Specimen: Nasopharyngeal Swab  Result Value Ref Range Status   SARS Coronavirus 2 by RT PCR NEGATIVE NEGATIVE Final    Comment: (NOTE) SARS-CoV-2 target nucleic acids are NOT DETECTED. The SARS-CoV-2 RNA is generally detectable in upper respiratoy specimens during the acute phase of infection. The lowest concentration of SARS-CoV-2 viral copies this assay can detect is 131 copies/mL. A negative result does not preclude SARS-Cov-2 infection and should not be used as the sole basis for treatment or other patient management decisions. A negative result may occur with  improper specimen collection/handling, submission of specimen other than nasopharyngeal swab, presence of viral mutation(s) within the areas targeted by this  assay, and inadequate number of viral copies (<131 copies/mL). A negative result must be combined with clinical observations, patient history, and epidemiological information. The expected result is Negative. Fact Sheet for Patients:  PinkCheek.be Fact Sheet for Healthcare Providers:  GravelBags.it This test is not yet ap proved or cleared by the [place] FDA and  has been authorized for detection and/or diagnosis of SARS-CoV-2 by FDA under an Emergency Use Authorization (EUA). This EUA will remain  in effect (meaning this test can be used) for the duration of the COVID-19 declaration under Section 564(b)(1) of the Act, 21 U.S.C. section 360bbb-3(b)(1), unless the authorization is terminated or revoked sooner.    Influenza A by PCR NEGATIVE NEGATIVE Final   Influenza B by PCR NEGATIVE NEGATIVE Final    Comment: (NOTE) The Xpert Xpress SARS-CoV-2/FLU/RSV assay is intended as an aid in  the diagnosis of influenza from Nasopharyngeal swab specimens and  should not be used as a sole basis for treatment. Nasal washings and  aspirates are unacceptable for Xpert Xpress SARS-CoV-2/FLU/RSV  testing. Fact Sheet for Patients: PinkCheek.be Fact Sheet for Healthcare Providers: GravelBags.it This test is not yet approved or cleared by the [place] FDA and  has been authorized for detection and/or diagnosis of SARS-CoV-2 by  FDA under an Emergency Use Authorization (EUA). This EUA will remain  in effect (meaning this test can be used) for the duration of the  Covid-19 declaration under Section 564(b)(1) of the Act, 21  U.S.C. section 360bbb-3(b)(1), unless the authorization is  terminated or revoked. Performed at [facility], [address] [place], [place] [address]     RADIOLOGY:  NM GI Blood Loss  Result Date: 06/28/2019 CLINICAL DATA:  74 year old  female with lower GI bleed. EXAM: NUCLEAR MEDICINE GASTROINTESTINAL BLEEDING SCAN TECHNIQUE: Sequential abdominal images were obtained following intravenous administration of Tc-28m labeled red blood cells. RADIOPHARMACEUTICALS:  22.757 mCi Tc-59m pertechnetate in-vitro labeled red cells. COMPARISON:  CT of the pelvis dated 09/14/2018. FINDINGS: There is physiologic activity related to cardiac blood pool, liver uptake as well as vasculature. There is excretion of radiopharmaceutical into the bladder. No activity identified conforming to the bowel to suggest active GI bleed. IMPRESSION: No scintigraphic evidence of active GI bleed. Electronically Signed   By: Milas Hock  Radparvar M.D.   On: 06/28/2019 17:32     CODE STATUS:     Code Status Orders  (From admission, onward)         Start     Ordered   06/28/19 1334  Do not attempt resuscitation (DNR)  Continuous    Question Answer Comment  In the event of cardiac or respiratory ARREST Do not call a "code blue"   In the event of cardiac or respiratory ARREST Do not perform Intubation, CPR, defibrillation or ACLS   In the event of cardiac or respiratory ARREST Use medication by any route, position, wound care, and other measures to relive pain and suffering. May use oxygen, suction and manual treatment of airway obstruction as needed for comfort.      06/28/19 1333        Code Status History    Date Active Date Inactive Code Status Order ID Comments User Context   06/28/2019 1255 06/28/2019 1333 DNR [id number]  Ivor Costa, MD Inpatient   02/23/2019 1938 02/27/2019 1810 Full Code [id number]  Edwin Dada, MD Inpatient   02/13/2019 2011 02/16/2019 1819 DNR [id number]  Nicolette Bang, DO ED   09/14/2018 2220 09/17/2018 2350 DNR [id number]  Henreitta Leber, MD Inpatient   08/05/2018 1547 08/10/2018 1949 Full Code [id number]  Gladstone Lighter, MD Inpatient   06/25/2018 1417 06/30/2018 1557 Full Code [id number]  Epifanio Lesches, MD ED    09/15/2017 1747 09/18/2017 2106 DNR [id number]  Hillary Bow, MD ED   08/26/2017 2216 08/30/2017 1604 Full Code [id number]  Nicholes Mango, MD Inpatient   01/08/2015 0307 01/09/2015 1336 Full Code [id number]  Theodoro Grist, MD Inpatient   Advance Care Planning Activity       TOTAL TIME TAKING CARE OF THIS PATIENT: *40* minutes.    Fritzi Mandes M.D  [facility]    CC: Primary care physician; Ricardo Jericho, NP

## 2019-07-02 ENCOUNTER — Telehealth (HOSPITAL_COMMUNITY): Payer: Self-pay

## 2019-07-02 NOTE — Telephone Encounter (Signed)
Contacted Karen [name] to see how she was doing since hospital stay.  She states today she is feeling much better.  She states her weight has went up a couple of pounds but not having any swelling and no shortness of breath.  She has all her medications and verified them.  She still would like Moms meals and she does well when she eats them.  She does not watch high sodium foods when food is cooked at home.  She denies any chest pain, headaches or dizziness.  She is aware of up coming appts.  Will continue to visit for heart failure.   [place] [phone]

## 2019-07-05 NOTE — Progress Notes (Signed)
Date:  07/07/2019   ID:  Karen [name], DOB Mar 23, 1945, MRN [id number]  Patient Location:  [place] [address]   Provider location:   [facility], [place] office  PCP:  Ricardo Jericho, NP  Cardiologist:  Arvid Right [facility]   Chief Complaint  Patient presents with  . OTHER    4 month f/u no complaints today. Meds reviewed verbally with pt.    History of Present Illness:    Karen [name] is a 74 y.o. 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  In the hospital 06/2019, records reviewed Hyponatremia:Na 127, up to 133 with fluids Rectal bleeding:suspected this is diverticular bleed. Hgb 10.9 -->10.0 -->9.3--8.7. Colonoscopy 08/2017 - Diverticulosis in the descending colon, in the transverse colon and in the ascending colon. A1c8.4,   Drinks a lot of water  Blood pressure elevated here on today's visit, rushing in , storm outside She has not been checking at home on a regular basis  Breathing stable On 3 liters [place] Wheelchair Not much walking or exercise  Dry weight approximately 210 pounds, weight today 224  Lab work reviewed in detail HBG 8.4  EKG personally reviewed by myself on todays visit Shows normal sinus rhythm with rate 81 bpm, PACs, no significant ST-T wave changes  Other past medical history reviewed Junctional bradycardia on arrival to the hospital with hypotension Markedly elevated right heart pressures on echocardiogram,severe pulmonary HTN,  BNP was 800  Treated with Lasix IV twice daily Poor renal function on arrival, suspected cardiorenal syndrome Improvement of renal function with diuresis Improvement of her  abdominal bloating, cough improved, leg swelling improved   Prior CV studies:   The following studies were reviewed today:    Past Medical History:  Diagnosis Date  . (HFpEF) heart failure with preserved ejection fraction ([place]) 2017   a. 2017 Echo: EF 50%; b. 06/2018 Echo: EF 50-55%; c. 08/2018 Echo: EF 50-55%, Nl RV fxn. RVSP 60.41mmHg. Mild BAE. Mild to mod TR.     Marland Kitchen Acute on chronic respiratory failure with hypoxia and hypercapnia ([place]) 01/07/2015  . Anemia   . 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 Asthma   . Cataract   . CHF (congestive heart failure) ([place])   . CKD (chronic kidney disease), stage III   . COPD (chronic obstructive pulmonary disease) ([place])    (1) 06/2018 tobacco use, home 3L oxygen   . Diabetes mellitus without complication (HCC)    (1) A1C 7.7 (06/2018)  . Edema, peripheral 04/20/2014  . GI bleed 06/28/2019  . History of kidney stones   . Hyperlipidemia   . Hypertension   . Iron deficiency anemia 06/22/2014  . Junctional bradycardia    a. In setting of beta blocker therapy.  . Leucocytosis 10/19/2015  . Morbid obesity ([place])   . Overactive bladder   . Primary osteoarthritis of right knee 09/01/2016  . Sciatica 01/07/2015   Past Surgical History:  Procedure Laterality Date  . APPENDECTOMY    . CESAREAN SECTION     x3  . CHOLECYSTECTOMY    . COLONOSCOPY WITH PROPOFOL N/A 08/28/2017   Procedure: COLONOSCOPY WITH PROPOFOL;  Surgeon: Lucilla Lame, MD;  Location: [facility] ENDOSCOPY;  Service:  Endoscopy;  Laterality: N/A;  . COLONOSCOPY WITH PROPOFOL N/A 08/29/2017   Procedure: COLONOSCOPY WITH PROPOFOL;  Surgeon: Lucilla Lame, MD;  Location: [facility] ENDOSCOPY;  Service: Endoscopy;  Laterality: N/A;  . CYSTOSCOPY W/ URETERAL STENT PLACEMENT Right 09/15/2017   Procedure: CYSTOSCOPY WITH RETROGRADE PYELOGRAM/URETERAL STENT PLACEMENT;  Surgeon: Cleon Gustin, MD;  Location: [facility] ORS;  Service: Urology;  Laterality: Right;  . CYSTOSCOPY/URETEROSCOPY/HOLMIUM LASER/STENT PLACEMENT Right  10/09/2017   Procedure: CYSTOSCOPY/URETEROSCOPY/HOLMIUM LASER/STENT PLACEMENT;  Surgeon: Abbie Sons, MD;  Location: [facility] ORS;  Service: Urology;  Laterality: Right;  right Stent exchange  . ESOPHAGOGASTRODUODENOSCOPY (EGD) WITH PROPOFOL N/A 09/16/2018   Procedure: ESOPHAGOGASTRODUODENOSCOPY (EGD) WITH PROPOFOL;  Surgeon: Lin Landsman, MD;  Location: [place];  Service: Gastroenterology;  Laterality: N/A;  . EYE SURGERY       Current Meds  Medication Sig  . albuterol (VENTOLIN HFA) 108 (90 Base) MCG/ACT inhaler INHALE 2 PUFFS BY MOUTH EVERY 6 HOURS AS NEEDED FOR WHEEZING  . apixaban (ELIQUIS) 5 MG TABS tablet Take 1 tablet (5 mg total) by mouth 2 (two) times daily.  Marland Kitchen atorvastatin (LIPITOR) 10 MG tablet Take 1 tablet by mouth daily.  Marland Kitchen esomeprazole (NEXIUM) 40 MG capsule Take 40 mg by mouth daily.  . Ferrous Sulfate (IRON) 325 (65 Fe) MG TABS Take 1 tablet by mouth daily.  . fluticasone (FLONASE) 50 MCG/ACT nasal spray Place 2 sprays into both nostrils daily.  . Fluticasone-Umeclidin-Vilant 100-62.5-25 MCG/INH AEPB Inhale 1 puff into the lungs daily.  . furosemide (LASIX) 20 MG tablet Take 2 tablets (40 mg total) by mouth daily.  . Insulin Human (INSULIN PUMP) SOLN Inject 1 each into the skin 3 times daily with meals, bedtime and 2 AM. (Patient taking differently: Inject 1 each into the skin 3 times daily with meals, bedtime and 2 AM. Humalog Insulin 0-76 units daily insulin pump)  . ipratropium-albuterol (DUONEB) 0.5-2.5 (3) MG/3ML SOLN Inhale 3 mLs into the lungs 4 (four) times daily.  Marland Kitchen losartan-hydrochlorothiazide (HYZAAR) 100-25 MG tablet Take 1 tablet by mouth daily.  . metFORMIN (GLUCOPHAGE) 500 MG tablet Take 1,000 mg by mouth 2 (two) times daily with a meal.   . montelukast (SINGULAIR) 10 MG tablet Take 10 mg by mouth at bedtime.  . potassium chloride (KLOR-CON) 10 MEQ tablet Take 1 tablet (10 mEq total) by mouth daily.  . vitamin B-12 (CYANOCOBALAMIN) 500 MCG tablet  Take 500 mcg by mouth daily.     Allergies:   Ace inhibitors, Beta adrenergic blockers, Gabapentin, Lisinopril, Lyrica [pregabalin], and Shrimp [shellfish allergy]   Social History   Tobacco Use  . Smoking status: Former Smoker    Packs/day: 1.00    Years: 20.00    Pack years: 20.00    Quit date: 12/04/1992    Years since quitting: 26.6  . Smokeless tobacco: Never Used  Substance Use Topics  . Alcohol use: No  . Drug use: No     Current Outpatient Medications on File Prior to Visit  Medication Sig Dispense Refill  . albuterol (VENTOLIN HFA) 108 (90 Base) MCG/ACT inhaler INHALE 2 PUFFS BY MOUTH EVERY 6 HOURS AS NEEDED FOR WHEEZING    . apixaban (ELIQUIS) 5 MG TABS tablet Take 1 tablet (5 mg total) by mouth 2 (two) times daily. 60 tablet 11  . atorvastatin (LIPITOR) 10 MG tablet Take 1 tablet by mouth daily.    Marland Kitchen esomeprazole (NEXIUM) 40 MG capsule Take 40 mg by mouth daily.    . Ferrous Sulfate (IRON)  325 (65 Fe) MG TABS Take 1 tablet by mouth daily.  11  . fluticasone (FLONASE) 50 MCG/ACT nasal spray Place 2 sprays into both nostrils daily.    . Fluticasone-Umeclidin-Vilant 100-62.5-25 MCG/INH AEPB Inhale 1 puff into the lungs daily.    . furosemide (LASIX) 20 MG tablet Take 2 tablets (40 mg total) by mouth daily. 60 tablet 5  . Insulin Human (INSULIN PUMP) SOLN Inject 1 each into the skin 3 times daily with meals, bedtime and 2 AM. (Patient taking differently: Inject 1 each into the skin 3 times daily with meals, bedtime and 2 AM. Humalog Insulin 0-76 units daily insulin pump)    . ipratropium-albuterol (DUONEB) 0.5-2.5 (3) MG/3ML SOLN Inhale 3 mLs into the lungs 4 (four) times daily.    Marland Kitchen losartan-hydrochlorothiazide (HYZAAR) 100-25 MG tablet Take 1 tablet by mouth daily.    . metFORMIN (GLUCOPHAGE) 500 MG tablet Take 1,000 mg by mouth 2 (two) times daily with a meal.     . montelukast (SINGULAIR) 10 MG tablet Take 10 mg by mouth at bedtime.    . potassium chloride (KLOR-CON) 10  MEQ tablet Take 1 tablet (10 mEq total) by mouth daily. 30 tablet 0  . vitamin B-12 (CYANOCOBALAMIN) 500 MCG tablet Take 500 mcg by mouth daily.     No current facility-administered medications on file prior to visit.     Family Hx: The patient's family history includes Other in her father and mother.  ROS:   Please see the history of present illness.    Review of Systems  Constitutional: Negative.   HENT: Negative.   Respiratory: Positive for shortness of breath.   Cardiovascular: Negative.   Gastrointestinal: Negative.   Musculoskeletal: Negative.   Neurological: Negative.   Psychiatric/Behavioral: Negative.   All other systems reviewed and are negative.     Labs/Other Tests and Data Reviewed:    Recent Labs: 06/28/2019: ALT 18; B Natriuretic Peptide 40.0 06/29/2019: BUN 14; Creatinine, Ser 0.75; Hemoglobin 8.5; Magnesium 1.9; Platelets 268; Potassium 4.1; Sodium 133; TSH 1.440   Recent Lipid Panel No results found for: CHOL, TRIG, HDL, CHOLHDL, LDLCALC, LDLDIRECT  Wt Readings from Last 3 Encounters:  07/07/19 224 lb 4 oz (101.7 kg)  06/30/19 217 lb (98.4 kg)  06/19/19 214 lb (97.1 kg)     Exam:    BP (!) 160/80 (BP Location: Right Arm, Patient Position: Sitting, Cuff Size: Normal)   Pulse 81   Ht 5\' 3"  (1.6 m)   Wt 224 lb 4 oz (101.7 kg)   SpO2 96%   BMI 39.72 kg/m   Constitutional:  oriented to person, place, and time. No distress. Wheelchair and oxygen HENT:  Head: Grossly normal Eyes:  no discharge. No scleral icterus.  Neck: No JVD, no carotid bruits  Cardiovascular: Regular rate and rhythm, no murmurs appreciated 1+ pitting lower extremity edema below the knees Pulmonary/Chest: Clear to auscultation bilaterally, no wheezes or rails Abdominal: Soft.  no distension.  no tenderness.  Musculoskeletal: Normal range of motion Neurological:  normal muscle tone. Coordination normal. No atrophy Skin: Skin warm and dry Psychiatric: normal affect,  pleasant  ASSESSMENT & PLAN:    Chronic diastolic heart failure (HCC) - Weight is up 14 pounds above her dry weight Recommend she increase Lasix up to 40 mg daily, extra 20 to 40 mg in the afternoon for continued leg swelling Continue potassium daily, extra potassium if she does add afternoon Lasix Recommend she cut back on her Lasix Recommend she restrict  her free water, may need a touch of sodium given recent hospitalization for hyponatremia  Essential hypertension - Blood pressure is elevated, she will monitor numbers at home and call our office  Type 2 diabetes mellitus without complication, with long-term current use of insulin (HCC) - Recommended modification of her diet Unable to exercise secondary to leg weakness  Junctional bradycardia - All beta-blockers, calcium channel blockers held, rate stable    Total encounter time more than 25 minutes  Greater than 50% was spent in counseling and coordination of care with the patient   Disposition: Follow-up in 3 months   Signed, Ida Rogue, MD  07/07/2019 3:05 PM    [place] [facility] [address], [place], [place] [address]

## 2019-07-07 ENCOUNTER — Encounter: Payer: Self-pay | Admitting: Cardiovascular Disease

## 2019-07-07 ENCOUNTER — Other Ambulatory Visit: Payer: Self-pay | Admitting: Family

## 2019-07-07 ENCOUNTER — Ambulatory Visit (INDEPENDENT_AMBULATORY_CARE_PROVIDER_SITE_OTHER): Payer: Medicare Other | Admitting: Cardiovascular Disease

## 2019-07-07 ENCOUNTER — Other Ambulatory Visit: Payer: Self-pay

## 2019-07-07 VITALS — BP 160/80 | HR 81 | Ht 63.0 in | Wt 224.2 lb

## 2019-07-07 DIAGNOSIS — I5032 Chronic diastolic (congestive) heart failure: Secondary | ICD-10-CM | POA: Diagnosis not present

## 2019-07-07 DIAGNOSIS — E119 Type 2 diabetes mellitus without complications: Secondary | ICD-10-CM

## 2019-07-07 DIAGNOSIS — Z7901 Long term (current) use of anticoagulants: Secondary | ICD-10-CM

## 2019-07-07 DIAGNOSIS — I10 Essential (primary) hypertension: Secondary | ICD-10-CM

## 2019-07-07 DIAGNOSIS — J449 Chronic obstructive pulmonary disease, unspecified: Secondary | ICD-10-CM | POA: Diagnosis not present

## 2019-07-07 DIAGNOSIS — I480 Paroxysmal atrial fibrillation: Secondary | ICD-10-CM | POA: Diagnosis not present

## 2019-07-07 MED ORDER — FUROSEMIDE 20 MG PO TABS
40.0000 mg | ORAL_TABLET | ORAL | 3 refills | Status: DC
Start: 2019-07-07 — End: 2019-07-07

## 2019-07-07 MED ORDER — POTASSIUM CHLORIDE CRYS ER 10 MEQ PO TBCR: 10.0000 meq | EXTENDED_RELEASE_TABLET | ORAL | 3 refills | Status: DC

## 2019-07-07 MED ORDER — FUROSEMIDE 20 MG PO TABS
40.0000 mg | ORAL_TABLET | ORAL | 3 refills | Status: DC
Start: 2019-07-07 — End: 2019-10-24

## 2019-07-07 NOTE — Patient Instructions (Addendum)
Medication Instructions:  Your physician has recommended you make the following change in your medication:  1. START Furosemide 20 mg take 2 tablets (40 mg) in the AM 2. AS NEEDED Furosemide 20 mg take 1-2 tablets (20-40 mg) in the afternoon for leg swelling or shortness of breath 3. TAKE Extra potassium pill if you take afternoon furosemide  Cut back on the water Add a touch of sodium  Monitor blood pressure and call with numbers  Iron pill daily  If you need a refill on your cardiac medications before your next appointment, please call your pharmacy.    Lab work: No new labs needed   If you have labs (blood work) drawn today and your tests are completely normal, you will receive your results only by: Marland Kitchen MyChart Message (if you have MyChart) OR . A paper copy in the mail If you have any lab test that is abnormal or we need to change your treatment, we will call you to review the results.   Testing/Procedures: No new testing needed   Follow-Up: At [facility], you and your health needs are our priority.  As part of our continuing mission to provide you with exceptional heart care, we have created designated Provider Care Teams.  These Care Teams include your primary Cardiologist (physician) and Advanced Practice Providers (APPs -  Physician Assistants and Nurse Practitioners) who all work together to provide you with the care you need, when you need it.  . You will need a follow up appointment in 3 months .  Marland Kitchen Providers on your designated Care Team:   . Murray Hodgkins, NP . Christell Faith, PA-C . Marrianne Mood, PA-C  Any Other Special Instructions Will Be Listed Below (If Applicable).  For educational health videos Log in to : www.myemmi.com Or : [email or web address], password : [email or web address]   How to Take Your Blood Pressure You can take your blood pressure at home with a machine. You may need to check your blood pressure at home:  To check if you have high blood pressure  (hypertension).  To check your blood pressure over time.  To make sure your blood pressure medicine is working. Supplies needed: You will need a blood pressure machine, or monitor. You can buy one at a drugstore or online. When choosing one:  Choose one with an arm cuff.  Choose one that wraps around your upper arm. Only one finger should fit between your arm and the cuff.  Do not choose one that measures your blood pressure from your wrist or finger. Your doctor can suggest a monitor. How to prepare Avoid these things for 30 minutes before checking your blood pressure:  Drinking caffeine.  Drinking alcohol.  Eating.  Smoking.  Exercising. Five minutes before checking your blood pressure:  Pee.  Sit in a dining chair. Avoid sitting in a soft couch or armchair.  Be quiet. Do not talk. How to take your blood pressure Follow the instructions that came with your machine. If you have a digital blood pressure monitor, these may be the instructions: 1. Sit up straight. 2. Place your feet on the floor. Do not cross your ankles or legs. 3. Rest your left arm at the level of your heart. You may rest it on a table, desk, or chair. 4. Pull up your shirt sleeve. 5. Wrap the blood pressure cuff around the upper part of your left arm. The cuff should be 1 inch (2.5 cm) above your elbow. It is best to wrap the  cuff around bare skin. 6. Fit the cuff snugly around your arm. You should be able to place only one finger between the cuff and your arm. 7. Put the cord inside the groove of your elbow. 8. Press the power button. 9. Sit quietly while the cuff fills with air and loses air. 10. Write down the numbers on the screen. 11. Wait 2-3 minutes and then repeat steps 1-10. What do the numbers mean? Two numbers make up your blood pressure. The first number is called systolic pressure. The second is called diastolic pressure. An example of a blood pressure reading is "120 over 80" (or  120/80). If you are an adult and do not have a medical condition, use this guide to find out if your blood pressure is normal: Normal  First number: below 120.  Second number: below 80. Elevated  First number: 120-129.  Second number: below 80. Hypertension stage 1  First number: 130-139.  Second number: 80-89. Hypertension stage 2  First number: 140 or above.  Second number: 67 or above. Your blood pressure is above normal even if only the top or bottom number is above normal. Follow these instructions at home:  Check your blood pressure as often as your doctor tells you to.  Take your monitor to your next doctor's appointment. Your doctor will: ? Make sure you are using it correctly. ? Make sure it is working right.  Make sure you understand what your blood pressure numbers should be.  Tell your doctor if your medicines are causing side effects. Contact a doctor if:  Your blood pressure keeps being high. Get help right away if:  Your first blood pressure number is higher than 180.  Your second blood pressure number is higher than 120. This information is not intended to replace advice given to you by your health care provider. Make sure you discuss any questions you have with your health care provider. Document Revised: 02/02/2017 Document Reviewed: 07/30/2015 Elsevier Patient Education  2020 [employer].

## 2019-07-14 ENCOUNTER — Emergency Department
Admission: EM | Admit: 2019-07-14 | Discharge: 2019-07-15 | Disposition: A | Discharge status: Home / Self Care | Payer: Medicare Other | Attending: Emergency Medicine | Admitting: Emergency Medicine

## 2019-07-14 ENCOUNTER — Telehealth (HOSPITAL_COMMUNITY): Payer: Self-pay

## 2019-07-14 ENCOUNTER — Other Ambulatory Visit: Payer: Self-pay

## 2019-07-14 ENCOUNTER — Encounter: Payer: Self-pay | Admitting: Emergency Medicine

## 2019-07-14 DIAGNOSIS — R799 Abnormal finding of blood chemistry, unspecified: Secondary | ICD-10-CM | POA: Diagnosis present

## 2019-07-14 DIAGNOSIS — N1831 Chronic kidney disease, stage 3a: Secondary | ICD-10-CM | POA: Insufficient documentation

## 2019-07-14 DIAGNOSIS — Z7901 Long term (current) use of anticoagulants: Secondary | ICD-10-CM | POA: Diagnosis not present

## 2019-07-14 DIAGNOSIS — J449 Chronic obstructive pulmonary disease, unspecified: Secondary | ICD-10-CM | POA: Insufficient documentation

## 2019-07-14 DIAGNOSIS — I509 Heart failure, unspecified: Secondary | ICD-10-CM | POA: Insufficient documentation

## 2019-07-14 DIAGNOSIS — I130 Hypertensive heart and chronic kidney disease with heart failure and stage 1 through stage 4 chronic kidney disease, or unspecified chronic kidney disease: Secondary | ICD-10-CM | POA: Insufficient documentation

## 2019-07-14 DIAGNOSIS — Z794 Long term (current) use of insulin: Secondary | ICD-10-CM | POA: Diagnosis not present

## 2019-07-14 DIAGNOSIS — E1122 Type 2 diabetes mellitus with diabetic chronic kidney disease: Secondary | ICD-10-CM | POA: Diagnosis not present

## 2019-07-14 DIAGNOSIS — Z87891 Personal history of nicotine dependence: Secondary | ICD-10-CM | POA: Diagnosis not present

## 2019-07-14 DIAGNOSIS — D649 Anemia, unspecified: Secondary | ICD-10-CM | POA: Diagnosis not present

## 2019-07-14 DIAGNOSIS — Z79899 Other long term (current) drug therapy: Secondary | ICD-10-CM | POA: Diagnosis not present

## 2019-07-14 LAB — COMPREHENSIVE METABOLIC PANEL
ALT: 14 U/L (ref 0–44)
AST: 15 U/L (ref 15–41)
Albumin: 3.7 g/dL (ref 3.5–5.0)
Alkaline Phosphatase: 59 U/L (ref 38–126)
Anion gap: 12 (ref 5–15)
BUN: 23 mg/dL (ref 8–23)
CO2: 28 mmol/L (ref 22–32)
Calcium: 8.9 mg/dL (ref 8.9–10.3)
Chloride: 92 mmol/L — ABNORMAL LOW (ref 98–111)
Creatinine, Ser: 0.88 mg/dL (ref 0.44–1.00)
GFR calc Af Amer: >60 mL/min (ref >60)
GFR calc non Af Amer: >60 mL/min (ref >60)
Glucose, Bld: 192 mg/dL — ABNORMAL HIGH (ref 70–99)
Potassium: 3.7 mmol/L (ref 3.5–5.1)
Sodium: 132 mmol/L — ABNORMAL LOW (ref 135–145)
Total Bilirubin: 0.6 mg/dL (ref 0.3–1.2)
Total Protein: 6.5 g/dL (ref 6.5–8.1)

## 2019-07-14 LAB — CBC
HCT: 24.2 % — ABNORMAL LOW (ref 36.0–46.0)
Hemoglobin: 7.8 g/dL — ABNORMAL LOW (ref 12.0–15.0)
MCH: 27.5 pg (ref 26.0–34.0)
MCHC: 32.2 g/dL (ref 30.0–36.0)
MCV: 85.2 fL (ref 80.0–100.0)
Platelets: 286 10*3/uL (ref 150–400)
RBC: 2.84 MIL/uL — ABNORMAL LOW (ref 3.87–5.11)
RDW: 14.7 % (ref 11.5–15.5)
WBC: 12.9 10*3/uL — ABNORMAL HIGH (ref 4.0–10.5)
nRBC: 0 % (ref 0.0–0.2)

## 2019-07-14 LAB — PROTIME-INR
INR: 1 (ref 0.8–1.2)
Prothrombin Time: 12.9 seconds (ref 11.4–15.2)

## 2019-07-14 LAB — APTT: aPTT: 26 seconds (ref 24–36)

## 2019-07-14 MED ORDER — SODIUM CHLORIDE 0.9 % IV SOLN: 10.0000 mL/h | Freq: Once | INTRAVENOUS | Status: DC

## 2019-07-14 NOTE — ED Triage Notes (Signed)
Pt had hgb 8.5 end of April when in hospital and now is 7.2. denies any bleeding. No black stools.  Felt fine today and was at doctor for routine check up and they called and told her that her hgb was low and go to hospital for transfusion.

## 2019-07-14 NOTE — Telephone Encounter (Signed)
Contacted to [place] to set up a home visit.  She states she has appts and her husband has appt.  She states she will look at the calendar and let me know when will be good.  She did say she is doing good, she has all her medications.  She is aware of lasix taking extra if she has swelling and weight gain but will need to take extra potassium on those days.  She states tries to watch her diet but loves the Moms meals because it takes the guess work out of it.  She does well with fluid when she is on them.  [employer] supplied her with the Moms meals but it has ran out.  Will keep trying to think of way for her to receive them, she is unable to pay for them and insurance does not cover it.  She denies shortness of breath or chest pain. Verified her medications. Will continue to visit for heart failure.   [place] [phone]

## 2019-07-15 DIAGNOSIS — D649 Anemia, unspecified: Secondary | ICD-10-CM | POA: Diagnosis not present

## 2019-07-15 LAB — PREPARE RBC (CROSSMATCH)

## 2019-07-15 NOTE — ED Provider Notes (Signed)
[facility] Emergency Department Provider Note  ____________________________________________  Time seen: Approximately 12:11 AM  I have reviewed the triage vital signs and the nursing notes.   HISTORY  Chief Complaint Abnormal Lab   HPI Karen [name] is a 74 y.o. female with a history of heart failure, COPD, CKD, diabetes, lower GI bleed, hypertension, hyperlipidemia, anemia, atrial fibrillation on Eliquis who presents for evaluation of worsening anemia.  Patient was admitted to the hospital 2 weeks ago for lower GI bleed requiring blood transfusion. No colonoscopy done during that admission. The GI bleed was thought to be due to diverticular disease. She had a nuclear medicine GI blood loss scan which was negative. Today she had a regular follow-up with her primary care doctor and noticed that her hemoglobin had dropped again from 8.5-7.2. She denies melena, hematochezia, hematemesis, coffee-ground emesis, hematuria. She denies any known bleeding. She continues to take her Eliquis. She is complaining of generalized fatigue which she has had since her last admission to the hospital. No chest pain, no worsening shortness of breat, no lightheadedness, no syncope.  Past Medical History:  Diagnosis Date  . (HFpEF) heart failure with preserved ejection fraction ([place]) 2017   a. 2017 Echo: EF 50%; b. 06/2018 Echo: EF 50-55%; c. 08/2018 Echo: EF 50-55%, Nl RV fxn. RVSP 60.20mmHg. Mild BAE. Mild to mod TR.     Marland Kitchen Acute on chronic respiratory failure with hypoxia and hypercapnia ([place]) 01/07/2015  . Anemia   . 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 Asthma   . Cataract   . CHF (congestive heart failure) ([place])   . CKD (chronic kidney disease), stage III   . COPD (chronic obstructive pulmonary disease) ([place])    (1) 06/2018 tobacco use, home 3L oxygen   . Diabetes mellitus without complication (HCC)    (1) A1C 7.7 (06/2018)  . Edema, peripheral 04/20/2014  . GI bleed 06/28/2019  . History of  kidney stones   . Hyperlipidemia   . Hypertension   . Iron deficiency anemia 06/22/2014  . Junctional bradycardia    a. In setting of beta blocker therapy.  . Leucocytosis 10/19/2015  . Morbid obesity ([place])   . Overactive bladder   . Primary osteoarthritis of right knee 09/01/2016  . Sciatica 01/07/2015    Patient Active Problem List   Diagnosis Date Noted  . Rectal bleeding 06/28/2019  . Hypokalemia 06/28/2019  . Hyponatremia 06/28/2019  . Type II diabetes mellitus with renal manifestations ([place]) 06/28/2019  . CKD (chronic kidney disease), stage IIIa 06/28/2019  . Atrial fibrillation, chronic ([place]) 06/28/2019  . Pulmonary edema 02/27/2019  . Bradycardia 02/24/2019  . Chronic respiratory failure with hypoxia ([place]) 02/23/2019  . Atypical chest pain 02/13/2019  . AVM (arteriovenous malformation) of small bowel, acquired   . Acute gastric ulcer with hemorrhage   . Chronic diastolic heart failure ([place]) 08/22/2018  . Diarrhea 08/22/2018  . Junctional bradycardia   . Acute on chronic heart failure with preserved ejection fraction (HFpEF) ([place])   . AKI (acute kidney injury) ([place])   . Symptomatic bradycardia 08/05/2018  . Acute on chronic respiratory failure ([place]) 06/25/2018  . 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 diabetes mellitus ([place]) 01/25/2018  . History of non anemic vitamin B12 deficiency 01/25/2018  . Personal history of kidney stones 11/12/2017  . Urge incontinence 11/12/2017  . Right ureteral stone 09/15/2017  . Acute GI bleeding   . GI bleed 08/26/2017  . Arthritis 08/10/2017  . Chronic kidney disease 08/10/2017  .  COPD (chronic obstructive pulmonary disease) ([place]) 08/10/2017  . Diabetes mellitus type 2, uncomplicated ([place]) 40/98/1191  . Hypertension 08/10/2017  . Obesity (BMI 35.0-39.9 without comorbidity) 04/11/2017  . Primary osteoarthritis of right knee 09/01/2016  . Leucocytosis 10/19/2015  . 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 Acute on chronic respiratory  failure with hypoxia and hypercapnia ([place]) 01/07/2015  . Sciatica 01/07/2015  . Weakness 01/07/2015  . 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 Iron deficiency anemia 06/22/2014  . Microalbuminuria 06/22/2014  . CHF (congestive heart failure) ([place]) 04/20/2014  . Edema, peripheral 04/20/2014    Past Surgical History:  Procedure Laterality Date  . APPENDECTOMY    . CESAREAN SECTION     x3  . CHOLECYSTECTOMY    . COLONOSCOPY WITH PROPOFOL N/A 08/28/2017   Procedure: COLONOSCOPY WITH PROPOFOL;  Surgeon: Lucilla Lame, MD;  Location: [facility] ENDOSCOPY;  Service: Endoscopy;  Laterality: N/A;  . COLONOSCOPY WITH PROPOFOL N/A 08/29/2017   Procedure: COLONOSCOPY WITH PROPOFOL;  Surgeon: Lucilla Lame, MD;  Location: [facility] ENDOSCOPY;  Service: Endoscopy;  Laterality: N/A;  . CYSTOSCOPY W/ URETERAL STENT PLACEMENT Right 09/15/2017   Procedure: CYSTOSCOPY WITH RETROGRADE PYELOGRAM/URETERAL STENT PLACEMENT;  Surgeon: Cleon Gustin, MD;  Location: [facility] ORS;  Service: Urology;  Laterality: Right;  . CYSTOSCOPY/URETEROSCOPY/HOLMIUM LASER/STENT PLACEMENT Right 10/09/2017   Procedure: CYSTOSCOPY/URETEROSCOPY/HOLMIUM LASER/STENT PLACEMENT;  Surgeon: Abbie Sons, MD;  Location: [facility] ORS;  Service: Urology;  Laterality: Right;  right Stent exchange  . ESOPHAGOGASTRODUODENOSCOPY (EGD) WITH PROPOFOL N/A 09/16/2018   Procedure: ESOPHAGOGASTRODUODENOSCOPY (EGD) WITH PROPOFOL;  Surgeon: Lin Landsman, MD;  Location: [place];  Service: Gastroenterology;  Laterality: N/A;  . EYE SURGERY      Prior to Admission medications   Medication Sig Start Date End Date Taking? Authorizing Provider  albuterol (VENTOLIN HFA) 108 (90 Base) MCG/ACT inhaler INHALE 2 PUFFS BY MOUTH EVERY 6 HOURS AS NEEDED FOR WHEEZING 01/02/19   [provider]  apixaban (ELIQUIS) 5 MG TABS tablet Take 1 tablet (5 mg total) by mouth 2 (two) times daily. 04/09/19   [name], MD  atorvastatin  (LIPITOR) 10 MG tablet Take 1 tablet by mouth daily.    [provider]  esomeprazole (NEXIUM) 40 MG capsule Take 40 mg by mouth daily. 12/17/18   [provider]  Ferrous Sulfate (IRON) 325 (65 Fe) MG TABS Take 1 tablet by mouth daily. 09/14/17   [provider]  fluticasone (FLONASE) 50 MCG/ACT nasal spray Place 2 sprays into both nostrils daily. 11/15/18   [provider]  Fluticasone-Umeclidin-Vilant 100-62.5-25 MCG/INH AEPB Inhale 1 puff into the lungs daily. 12/25/17   [provider]  furosemide (LASIX) 20 MG tablet Take 2 tablets (40 mg total) by mouth as directed. Take 2 tablets (40 mg) in the AM and extra 1-2 tablets (20-40 mg) in the afternoon for leg swelling or shortness of breath 07/07/19 10/05/19  [name], MD  Insulin Human (INSULIN PUMP) SOLN Inject 1 each into the skin 3 times daily with meals, bedtime and 2 AM. Patient taking differently: Inject 1 each into the skin 3 times daily with meals, bedtime and 2 AM. Humalog Insulin 0-76 units daily insulin pump 09/18/17   Dustin Flock, MD  ipratropium-albuterol (DUONEB) 0.5-2.5 (3) MG/3ML SOLN Inhale 3 mLs into the lungs 4 (four) times daily. 02/05/19   [provider]  losartan-hydrochlorothiazide (HYZAAR) 100-25 MG tablet Take 1 tablet by mouth daily. 01/05/19   [provider]  metFORMIN (GLUCOPHAGE) 500  MG tablet Take 1,000 mg by mouth 2 (two) times daily with a meal.     [provider]  montelukast (SINGULAIR) 10 MG tablet Take 10 mg by mouth at bedtime.    [provider]  potassium chloride (KLOR-CON) 10 MEQ tablet Take 1 tablet (10 mEq total) by mouth as directed. Take one tablet daily and when you take extra furosemide take extra tablet 07/07/19   [name], MD  vitamin B-12 (CYANOCOBALAMIN) 500 MCG tablet Take 500 mcg by mouth daily.    [provider]    Allergies Ace inhibitors, Beta adrenergic blockers, Gabapentin, Lisinopril,  Lyrica [pregabalin], and Shrimp [shellfish allergy]  Family History  Problem Relation Age of Onset  . Other Mother        unknown medical history  . Other Father        unknown medical history    Social History Social History   Tobacco Use  . Smoking status: Former Smoker    Packs/day: 1.00    Years: 20.00    Pack years: 20.00    Quit date: 12/04/1992    Years since quitting: 26.6  . Smokeless tobacco: Never Used  Substance Use Topics  . Alcohol use: No  . Drug use: No    Review of Systems  Constitutional: Negative for fever. + Fatigue Eyes: Negative for visual changes. ENT: Negative for sore throat. Neck: No neck pain  Cardiovascular: Negative for chest pain. Respiratory: Negative for shortness of breath. Gastrointestinal: Negative for abdominal pain, vomiting or diarrhea. Genitourinary: Negative for dysuria. Musculoskeletal: Negative for back pain. Skin: Negative for rash. Neurological: Negative for headaches, weakness or numbness. Psych: No SI or HI  ____________________________________________   PHYSICAL EXAM:  VITAL SIGNS: ED Triage Vitals  Enc Vitals Group     BP 07/14/19 1804 (!) 151/59     Pulse Rate 07/14/19 1804 80     Resp 07/14/19 1804 20     Temp 07/14/19 1804 97.9 F (36.6 C)     Temp Source 07/14/19 1804 Oral     SpO2 07/14/19 1804 100 %     Weight 07/14/19 1801 218 lb (98.9 kg)     Height 07/14/19 1801 5\' 3"  (1.6 m)     Head Circumference --      Peak Flow --      Pain Score 07/14/19 1801 0     Pain Loc --      Pain Edu? --      Excl. in [place]? --     Constitutional: Alert and oriented. Well appearing and in no apparent distress. HEENT:      Head: Normocephalic and atraumatic.         Eyes: Conjunctivae are normal. Sclera is non-icteric.       Mouth/Throat: Mucous membranes are moist.       Neck: Supple with no signs of meningismus. Cardiovascular: Regular rate and rhythm. No murmurs, gallops, or rubs. Respiratory: Normal  respiratory effort. Lungs are clear to auscultation bilaterally.  Gastrointestinal: Soft, non tender, and non distended with positive bowel sounds.  Genitourinary: Rectal exam showing brown stool guaiac negative Musculoskeletal:  No edema, cyanosis, or erythema of extremities. Neurologic: Normal speech and language. Face is symmetric. Moving all extremities. No gross focal neurologic deficits are appreciated. Skin: Skin is warm, dry and intact. No rash noted. Psychiatric: Mood and affect are normal. Speech and behavior are normal.  ____________________________________________   LABS (all labs ordered are listed, but only abnormal results are  displayed)  Labs Reviewed  CBC - Abnormal; Notable for the following components:      Result Value   WBC 12.9 (*)    RBC 2.84 (*)    Hemoglobin 7.8 (*)    HCT 24.2 (*)    All other components within normal limits  COMPREHENSIVE METABOLIC PANEL - Abnormal; Notable for the following components:   Sodium 132 (*)    Chloride 92 (*)    Glucose, Bld 192 (*)    All other components within normal limits  PROTIME-INR  APTT  TYPE AND SCREEN  PREPARE RBC (CROSSMATCH)   ____________________________________________  EKG  none  ____________________________________________  RADIOLOGY  none  ____________________________________________   PROCEDURES  Procedure(s) performed:yes .1-3 Lead EKG Interpretation Performed by: Rudene Re, MD Authorized by: Rudene Re, MD     Interpretation: normal     ECG rate assessment: normal     Rhythm: sinus rhythm     Ectopy: none     Conduction: normal     Critical Care performed: yes  CRITICAL CARE Performed by: Rudene Re  ?  Total critical care time: 35 min  Critical care time was exclusive of separately billable procedures and treating other patients.  Critical care was necessary to treat or prevent imminent or life-threatening deterioration.  Critical care was time  spent personally by me on the following activities: development of treatment plan with patient and/or surrogate as well as nursing, discussions with consultants, evaluation of patient's response to treatment, examination of patient, obtaining history from patient or surrogate, ordering and performing treatments and interventions, ordering and review of laboratory studies, ordering and review of radiographic studies, pulse oximetry and re-evaluation of patient's condition.  ____________________________________________   INITIAL IMPRESSION / ASSESSMENT AND PLAN / ED COURSE  74 y.o. female with a history of heart failure, COPD, CKD, diabetes, lower GI bleed, hypertension, hyperlipidemia, anemia, atrial fibrillation on Eliquis who presents for evaluation of worsening anemia. Patient saw her primary care doctor today for regular checkup and her hemoglobin was noted to have down trended from 8.5-7.2. She denies any known bleeding, melena, hematochezia, hematemesis, coffee-ground emesis, or hematuria since being discharged from the hospital 2 weeks ago after being admitted for diverticular bleed requiring blood transfusion. At this moment patient is hemodynamically stable with a blood pressure of 166/59 and a heart rate of 88. She is still on Eliquis. Repeat labs here showed a hemoglobin of 7.8 which was previously 8.5 during her most recent admission. There are no signs of active bleeding at this time and her rectal exam shows brown stool guaiac negative. Patient most likely has intermittent slow lower GI bleed possibly from diverticular disease. At this time with no active bleeding, stable hemoglobin done several hours apart, and hemodynamically stable I do not believe patient warrants admission to the hospital. We'll provide her with 1 unit of blood and discharge home with close follow-up with PCP. I discussed with the patient that she might need to consider stopping Eliquis if this continues to remain an active  problem for her but at this time recommended that she stays on Eliquis until further evaluation by her primary care doctor. Patient's medical records were reviewed. Patient was placed on telemetry during transfusion for monitoring.  _________________________ 3:05 AM on 07/15/2019 -----------------------------------------  Patient remained hemodynamically stable.  Received 1 unit of PRBCs.  Will discharge home.  Recommended continuing B12 and iron supplementation, close follow-up with PCP in 2 days for repeat labs and close follow-up.  Discussed my  standard return precautions and recommended return to the emergency room if she has any evidence of bleeding or any signs of acute blood loss anemia.    _____________________________________________ Please note:  Patient was evaluated in Emergency Department today for the symptoms described in the history of present illness. Patient was evaluated in the context of the global COVID-19 pandemic, which necessitated consideration that the patient might be at risk for infection with the SARS-CoV-2 virus that causes COVID-19. Institutional protocols and algorithms that pertain to the evaluation of patients at risk for COVID-19 are in a state of rapid change based on information released by regulatory bodies including the CDC and federal and state organizations. These policies and algorithms were followed during the patient's care in the ED.  Some ED evaluations and interventions may be delayed as a result of limited staffing during the pandemic.   [place] Controlled Substance Database was reviewed by me. ____________________________________________   FINAL CLINICAL IMPRESSION(S) / ED DIAGNOSES   Final diagnoses:  Acute on chronic anemia      NEW MEDICATIONS STARTED DURING THIS VISIT:  ED Discharge Orders    None       Note:  This document was prepared using Dragon voice recognition software and may include unintentional dictation errors.     Alfred Levins, [place], MD 07/15/19 [phone]

## 2019-07-15 NOTE — Discharge Instructions (Addendum)
Continue your daily iron and B12 supplementation. Follow up with your doctor in 2 days for repeat labs. Return to the ER for rectal bleeding, blood in the stool, dark black stool, coffee ground vomit, blood in your urine, if you pass out, or if you develop shortness of breath or chest pain.

## 2019-07-16 LAB — BPAM RBC
Blood Product Expiration Date: 202105222359
ISSUE DATE / TIME: 202105110030
Unit Type and Rh: 600

## 2019-07-16 LAB — TYPE AND SCREEN
ABO/RH(D): A NEG
Antibody Screen: NEGATIVE
Unit division: 0

## 2019-07-21 ENCOUNTER — Ambulatory Visit: Payer: Self-pay | Admitting: Physician Assistant

## 2019-08-12 ENCOUNTER — Ambulatory Visit: Payer: Medicare Other | Attending: Family | Admitting: Family

## 2019-08-12 ENCOUNTER — Encounter: Payer: Self-pay | Admitting: Family

## 2019-08-12 ENCOUNTER — Other Ambulatory Visit: Payer: Self-pay

## 2019-08-12 VITALS — BP 145/48 | HR 83 | Resp 15 | Ht 63.0 in | Wt 227.2 lb

## 2019-08-12 DIAGNOSIS — E1136 Type 2 diabetes mellitus with diabetic cataract: Secondary | ICD-10-CM | POA: Insufficient documentation

## 2019-08-12 DIAGNOSIS — I4891 Unspecified atrial fibrillation: Secondary | ICD-10-CM | POA: Insufficient documentation

## 2019-08-12 DIAGNOSIS — M7989 Other specified soft tissue disorders: Secondary | ICD-10-CM | POA: Insufficient documentation

## 2019-08-12 DIAGNOSIS — Z79899 Other long term (current) drug therapy: Secondary | ICD-10-CM | POA: Diagnosis not present

## 2019-08-12 DIAGNOSIS — J449 Chronic obstructive pulmonary disease, unspecified: Secondary | ICD-10-CM | POA: Insufficient documentation

## 2019-08-12 DIAGNOSIS — I10 Essential (primary) hypertension: Secondary | ICD-10-CM

## 2019-08-12 DIAGNOSIS — E1122 Type 2 diabetes mellitus with diabetic chronic kidney disease: Secondary | ICD-10-CM | POA: Insufficient documentation

## 2019-08-12 DIAGNOSIS — R5383 Other fatigue: Secondary | ICD-10-CM | POA: Insufficient documentation

## 2019-08-12 DIAGNOSIS — Z794 Long term (current) use of insulin: Secondary | ICD-10-CM | POA: Diagnosis not present

## 2019-08-12 DIAGNOSIS — Z9641 Presence of insulin pump (external) (internal): Secondary | ICD-10-CM | POA: Insufficient documentation

## 2019-08-12 DIAGNOSIS — E785 Hyperlipidemia, unspecified: Secondary | ICD-10-CM | POA: Insufficient documentation

## 2019-08-12 DIAGNOSIS — N183 Chronic kidney disease, stage 3 unspecified: Secondary | ICD-10-CM | POA: Diagnosis not present

## 2019-08-12 DIAGNOSIS — I5032 Chronic diastolic (congestive) heart failure: Secondary | ICD-10-CM | POA: Diagnosis not present

## 2019-08-12 DIAGNOSIS — Z7901 Long term (current) use of anticoagulants: Secondary | ICD-10-CM | POA: Diagnosis not present

## 2019-08-12 DIAGNOSIS — D500 Iron deficiency anemia secondary to blood loss (chronic): Secondary | ICD-10-CM | POA: Insufficient documentation

## 2019-08-12 DIAGNOSIS — I130 Hypertensive heart and chronic kidney disease with heart failure and stage 1 through stage 4 chronic kidney disease, or unspecified chronic kidney disease: Secondary | ICD-10-CM | POA: Insufficient documentation

## 2019-08-12 DIAGNOSIS — E119 Type 2 diabetes mellitus without complications: Secondary | ICD-10-CM

## 2019-08-12 DIAGNOSIS — Z87891 Personal history of nicotine dependence: Secondary | ICD-10-CM | POA: Diagnosis not present

## 2019-08-12 NOTE — Progress Notes (Signed)
[facility] - PHARMACIST COUNSELING NOTE  ADHERENCE ASSESSMENT  Adherence strategy: Medications not present at visit today. Patient manages medications independently at home and utilizes a pillbox. Patient does not drive; husband drives patient to pharmacy to pick-up medications.     Do you ever forget to take your medication? [] Yes (1) [x] No (0)  Do you ever skip doses due to side effects? [] Yes (1) [x] No (0)  Do you have trouble affording your medicines? [] Yes (1) [x] No (0)  Are you ever unable to pick up your medication due to transportation difficulties? [] Yes (1) [x] No (0)  Do you ever stop taking your medications because you don't believe they are helping? [] Yes (1) [x] No (0)  Total score 0   Recommendations given to patient about increasing adherence: None needed  Guideline-Directed Medical Therapy/Evidence Based Medicine  ACE/ARB/ARNI: Losartan-HCTZ 100-25 mg daily Beta Blocker: None (hx junctional bradycardia with beta blocker) Aldosterone Antagonist: None Diuretic: Furosemide 20 mg daily. Patient reports she takes 40 mg daily if feeling edematous or weight gain    SUBJECTIVE  HPI: Patient is a 74 y/o F with PMH as below who presents to HF clinic for follow-up visit. She was sent to ED on 5/10 due to concern for GIB after outpatient labs were notable for hemoglobin of 7.2. Thought to be related to chronic GI blood loss secondary to diverticular disease. She received 1 unit PRBC and was released. She was hospitalized 4/24 - 4/26 with rectal bleeding suspected to be related to diverticular disease.   Past Medical History:  Diagnosis Date  . (HFpEF) heart failure with preserved ejection fraction ([place]) 2017   a. 2017 Echo: EF 50%; b. 06/2018 Echo: EF 50-55%; c. 08/2018 Echo: EF 50-55%, Nl RV fxn. RVSP 60.71mmHg. Mild BAE. Mild to mod TR.     Marland Kitchen Acute on chronic respiratory failure with hypoxia and hypercapnia ([place]) 01/07/2015  . Anemia    . 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 Asthma   . Cataract   . CHF (congestive heart failure) ([place])   . CKD (chronic kidney disease), stage III   . COPD (chronic obstructive pulmonary disease) ([place])    (1) 06/2018 tobacco use, home 3L oxygen   . Diabetes mellitus without complication (H[facility])    (1) A1C 7.7 (06/2018)  . Edema, peripheral 04/20/2014  . GI bleed 06/28/2019  . History of kidney stones   . Hyperlipidemia   . Hypertension   . Iron deficiency anemia 06/22/2014  . Junctional bradycardia    a. In setting of beta blocker therapy.  . Leucocytosis 10/19/2015  . Morbid obesity ([place])   . Overactive bladder   . Primary osteoarthritis of right knee 09/01/2016  . Sciatica 01/07/2015     OBJECTIVE   Vital signs: HR 83, BP 145/48, weight (pounds) 227 ECHO: Date 02/14/19, EF 65-70%  BMP Latest Ref Rng & Units 07/14/2019 06/29/2019 06/28/2019  Glucose 70 - 99 mg/dL 192(H) 178(H) 149(H)  BUN 8 - 23 mg/dL 23 14 15   Creatinine 0.44 - 1.00 mg/dL 0.88 0.75 0.78  BUN/Creat Ratio 12 - 28 - - -  Sodium 135 - 145 mmol/L 132(L) 133(L) 132(L)  Potassium 3.5 - 5.1 mmol/L 3.7 4.1 3.5  Chloride 98 - 111 mmol/L 92(L) 92(L) 90(L)  CO2 22 - 32 mmol/L 28 31 31   Calcium 8.9 - 10.3 mg/dL 8.9 8.7(L) 8.7(L)    ASSESSMENT  Patient is well appearing and in no acute distress. She wears 3L oxygen continuously. She is a  diabetic and checks her blood sugars via a continuous glucose monitoring device and uses an insulin pump. She endorses taking medications daily as prescribed. Denies missed doses / adverse effects of therapy. Reports breathing is stable. Endorses weighing daily and that weight typically ranges 218 - 220 lbs.   PLAN  1). CHF -Fluid management with furosemide 20 mg daily + additional 20 mg PRN. Takes potassium 10 mEq if requiring additional furosemide dose -Continue daily weights -Continue low sodium diet -Discussed with provider - no changes to medication regimen today  2). Hypertension -Isolated  systolic hypertension in clinic -Antihypertensive regimen includes losartan-HCTZ 100-25 mg daily, furosemide as above -Denies signs/symptoms of hypotension  3). Atrial fibrillation -Not on rate or rhythm control -Hx of junctional bradycardia with beta blocker therapy -Anticoagulation with apixaban 5 mg BID -Denies bleeding -Denies NSAIDs / aspirin products  3). Asthma / COPD -Wears 3L oxygen chronically -Bronchodilators include Duonebs up to 4x/day, Trelegy Ellipta daily, albuterol HFA PRN -Montelukast 10 mg QHS  4).T2DM -Last HgbA1c was 7.9% on 06/17/19 -Uses continuous blood glucose monitoring device -Metformin 1000 mg BID, insulin via pump -On a moderate intensity statin  5). Chronic blood loss anemia -Ferrous sulfate 325 mg daily -Vitamin B12 500 mcg daily -Esomeprazole 40 mg daily   Time spent: 15 minutes  [place] Resident 08/12/2019 1:25 PM    Current Outpatient Medications:  .  albuterol (VENTOLIN HFA) 108 (90 Base) MCG/ACT inhaler, INHALE 2 PUFFS BY MOUTH EVERY 6 HOURS AS NEEDED FOR WHEEZING, Disp: , Rfl:  .  apixaban (ELIQUIS) 5 MG TABS tablet, Take 1 tablet (5 mg total) by mouth 2 (two) times daily., Disp: 60 tablet, Rfl: 11 .  atorvastatin (LIPITOR) 10 MG tablet, Take 1 tablet by mouth daily., Disp: , Rfl:  .  esomeprazole (NEXIUM) 40 MG capsule, Take 40 mg by mouth daily., Disp: , Rfl:  .  Ferrous Sulfate (IRON) 325 (65 Fe) MG TABS, Take 1 tablet by mouth daily., Disp: , Rfl: 11 .  fluticasone (FLONASE) 50 MCG/ACT nasal spray, Place 2 sprays into both nostrils daily., Disp: , Rfl:  .  Fluticasone-Umeclidin-Vilant 100-62.5-25 MCG/INH AEPB, Inhale 1 puff into the lungs daily., Disp: , Rfl:  .  furosemide (LASIX) 20 MG tablet, Take 2 tablets (40 mg total) by mouth as directed. Take 2 tablets (40 mg) in the AM and extra 1-2 tablets (20-40 mg) in the afternoon for leg swelling or shortness of breath, Disp: 360 tablet, Rfl: 3 .  Insulin Human (INSULIN  PUMP) SOLN, Inject 1 each into the skin 3 times daily with meals, bedtime and 2 AM. (Patient taking differently: Inject 1 each into the skin 3 times daily with meals, bedtime and 2 AM. Humalog Insulin 0-76 units daily insulin pump), Disp: , Rfl:  .  ipratropium-albuterol (DUONEB) 0.5-2.5 (3) MG/3ML SOLN, Inhale 3 mLs into the lungs 4 (four) times daily., Disp: , Rfl:  .  losartan-hydrochlorothiazide (HYZAAR) 100-25 MG tablet, Take 1 tablet by mouth daily., Disp: , Rfl:  .  metFORMIN (GLUCOPHAGE) 500 MG tablet, Take 1,000 mg by mouth 2 (two) times daily with a meal. , Disp: , Rfl:  .  montelukast (SINGULAIR) 10 MG tablet, Take 10 mg by mouth at bedtime., Disp: , Rfl:  .  potassium chloride (KLOR-CON) 10 MEQ tablet, Take 1 tablet (10 mEq total) by mouth as directed. Take one tablet daily and when you take extra furosemide take extra tablet, Disp: 90 tablet, Rfl: 3 .  vitamin B-12 (CYANOCOBALAMIN)  500 MCG tablet, Take 500 mcg by mouth daily., Disp: , Rfl:    COUNSELING POINTS/CLINICAL PEARLS Losartan (Goal: 150 mg once daily)  Warn female patient to avoid pregnancy and to report a pregnancy that occurs during therapy.  Side effects may include dizziness, upper respiratory infection, nasal congestion, and back pain.  Warn patient to avoid use of potassium supplements or potassium-containing salt substitutes unless they consult healthcare provider. Furosemide  Drug causes sun-sensitivity. Advise patient to use sunscreen and avoid tanning beds. Patient should avoid activities requiring coordination until drug effects are realized, as drug may cause dizziness, vertigo, or blurred vision. This drug may cause hyperglycemia, hyperuricemia, constipation, diarrhea, loss of appetite, nausea, vomiting, purpuric disorder, cramps, spasticity, asthenia, headache, paresthesia, or scaling eczema. Instruct patient to report unusual bleeding/bruising or signs/symptoms of hypotension, infection, pancreatitis, or  ototoxicity (tinnitus, hearing impairment). Advise patient to report signs/symptoms of a severe skin reactions (flu-like symptoms, spreading red rash, or skin/mucous membrane blistering) or erythema multiforme. Instruct patient to eat high-potassium foods during drug therapy, as directed by healthcare professional.  Patient should not drink alcohol while taking this drug.  DRUGS TO AVOID IN HEART FAILURE  Drug or Class Mechanism  Analgesics . NSAIDs . COX-2 inhibitors . Glucocorticoids  Sodium and water retention, increased systemic vascular resistance, decreased response to diuretics   Diabetes Medications . Metformin . Thiazolidinediones o Rosiglitazone (Avandia) o Pioglitazone (Actos) . DPP4 Inhibitors o Saxagliptin (Onglyza) o Sitagliptin (Januvia)   Lactic acidosis Possible calcium channel blockade   Unknown  Antiarrhythmics . Class I  o Flecainide o Disopyramide . Class III o Sotalol . Other o Dronedarone  Negative inotrope, proarrhythmic   Proarrhythmic, beta blockade  Negative inotrope  Antihypertensives . Alpha Blockers o Doxazosin . Calcium Channel Blockers o Diltiazem o Verapamil o Nifedipine . Central Alpha Adrenergics o Moxonidine . Peripheral Vasodilators o Minoxidil  Increases renin and aldosterone  Negative inotrope    Possible sympathetic withdrawal  Unknown  Anti-infective . Itraconazole . Amphotericin B  Negative inotrope Unknown  Hematologic . Anagrelide . Cilostazol   Possible inhibition of PD IV Inhibition of PD III causing arrhythmias  Neurologic/Psychiatric . Stimulants . Anti-Seizure Drugs o Carbamazepine o Pregabalin . Antidepressants o Tricyclics o Citalopram . Parkinsons o Bromocriptine o Pergolide o Pramipexole . Antipsychotics o Clozapine . Antimigraine o Ergotamine o Methysergide . Appetite suppressants . Bipolar o Lithium  Peripheral alpha and beta agonist activity  Negative inotrope and  chronotrope Calcium channel blockade  Negative inotrope, proarrhythmic Dose-dependent QT prolongation  Excessive serotonin activity/valvular damage Excessive serotonin activity/valvular damage Unknown  IgE mediated hypersensitivy, calcium channel blockade  Excessive serotonin activity/valvular damage Excessive serotonin activity/valvular damage Valvular damage  Direct myofibrillar degeneration, adrenergic stimulation  Antimalarials . Chloroquine . Hydroxychloroquine Intracellular inhibition of lysosomal enzymes  Urologic Agents . Alpha Blockers o Doxazosin o Prazosin o Tamsulosin o Terazosin  Increased renin and aldosterone  Adapted from Page RL, et al. "Drugs That May Cause or Exacerbate Heart Failure: A Scientific Statement from the [place]." Circulation 2016; 144:R15-Q00. DOI: 10.1161/CIR.0000000000000426   MEDICATION ADHERENCES TIPS AND STRATEGIES 1. Taking medication as prescribed improves patient outcomes in heart failure (reduces hospitalizations, improves symptoms, increases survival) 2. Side effects of medications can be managed by decreasing doses, switching agents, stopping drugs, or adding additional therapy. Please let someone in the [facility] know if you have having bothersome side effects so we can modify your regimen. Do not alter your medication regimen without talking to [place].  3. Medication reminders can help patients remember to take drugs on time. If you are missing or forgetting doses you can try linking behaviors, using pill boxes, or an electronic reminder like an alarm on your phone or an app. Some people can also get automated phone calls as medication reminders.

## 2019-08-12 NOTE — Patient Instructions (Signed)
Continue weighing daily and call for an overnight weight gain of > 2 pounds or a weekly weight gain of >5 pounds. 

## 2019-08-12 NOTE — Progress Notes (Signed)
[name], female    DOB: 1945/05/31, 74 y.o.   MRN: [id number]  HPI  Karen [name] is a 74 y/o female with a history of HTN, CKD, DM, asthma, hyperlipidemia, COPD, anemia and chronic heart failure.    Echo report from 02/14/2019 reviewed and showed an EF of 65-70% without LVH along with trivial MR/TR and moderately elevated PA pressure.   Was in the ED 07/14/19 due to decreased hemoglobin and fatigue. No signs of active bleeding. 1 unit PRBC's was given and she was released. Admitted 06/28/19 due to rectal bleeding. GI consult obtained. No signs of active bleeding. IV zofran given for nausea. Discharged after 2 days. Was in the ED 06/01/19 due to peripheral edema. Lasix doubled and given antibiotics for leg cellulitis. Admitted twice December 2020.   She presents today for a follow-up visit with a chief complaint of minimal fatigue upon moderate exertion. She describes this as chronic in nature having been present for several years. She has associated pedal edema and fluctuating weight gain along with this. She denies any difficulty sleeping, dizziness, abdominal distention, palpitations, chest pain, shortness of breath or cough.   Takes furosemide 20mg  daily with additional 20mg  as needed. Says that she takes the extra dose ~ once a week.   Past Medical History:  Diagnosis Date  . (HFpEF) heart failure with preserved ejection fraction (Karen [name]) 2017   a. 2017 Echo: EF 50%; b. 06/2018 Echo: EF 50-55%; c. 08/2018 Echo: EF 50-55%, Nl RV fxn. RVSP 60.59mmHg. Mild BAE. Mild to mod TR.     Marland Kitchen Acute on chronic respiratory failure with hypoxia and hypercapnia (Karen [name]) 01/07/2015  . Anemia   . 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 Asthma   . Cataract   . CHF (congestive heart failure) (Karen [name])   . CKD (chronic kidney disease), stage III   . COPD (chronic obstructive pulmonary disease) (Karen [name])    (1) 06/2018 tobacco use, home 3L oxygen   . Diabetes mellitus without complication (Karen [name])    (1) A1C 7.7 (06/2018)  .  Edema, peripheral 04/20/2014  . GI bleed 06/28/2019  . History of kidney stones   . Hyperlipidemia   . Hypertension   . Iron deficiency anemia 06/22/2014  . Junctional bradycardia    a. In setting of beta blocker therapy.  . Leucocytosis 10/19/2015  . Morbid obesity (Karen [name])   . Overactive bladder   . Primary osteoarthritis of right knee 09/01/2016  . Sciatica 01/07/2015   Past Surgical History:  Procedure Laterality Date  . APPENDECTOMY    . CESAREAN SECTION     x3  . CHOLECYSTECTOMY    . COLONOSCOPY WITH PROPOFOL N/A 08/28/2017   Procedure: COLONOSCOPY WITH PROPOFOL;  Surgeon: Karen Lame, [name];  Location: [name];  Service: Endoscopy;  Laterality: N/A;  . COLONOSCOPY WITH PROPOFOL N/A 08/29/2017   Procedure: COLONOSCOPY WITH PROPOFOL;  Surgeon: Karen Lame, [name];  Location: [name];  Service: Endoscopy;  Laterality: N/A;  . CYSTOSCOPY W/ URETERAL STENT PLACEMENT Right 09/15/2017   Procedure: CYSTOSCOPY WITH RETROGRADE PYELOGRAM/URETERAL STENT PLACEMENT;  Surgeon: Karen Gustin, [name];  Location: [name];  Service: Urology;  Laterality: Right;  . CYSTOSCOPY/URETEROSCOPY/HOLMIUM LASER/STENT PLACEMENT Right 10/09/2017   Procedure: CYSTOSCOPY/URETEROSCOPY/HOLMIUM LASER/STENT PLACEMENT;  Surgeon: Karen Sons, [name];  Location: [name];  Service: Urology;  Laterality: Right;  right Stent exchange  . ESOPHAGOGASTRODUODENOSCOPY (EGD) WITH PROPOFOL N/A 09/16/2018   Procedure: ESOPHAGOGASTRODUODENOSCOPY (EGD) WITH PROPOFOL;  Surgeon: Karen Landsman, [name];  Location: [name];  Service: Gastroenterology;  Laterality: N/A;  . EYE SURGERY     Family History  Problem Relation Age of Onset  . Other Mother        unknown medical history  . Other Father        unknown medical history   Social History   Tobacco Use  . Smoking status: Former Smoker    Packs/day: 1.00    Years: 20.00    Pack years: 20.00    Quit date: 12/04/1992    Years since quitting: 26.7  . Smokeless  tobacco: Never Used  Substance Use Topics  . Alcohol use: No   Allergies  Allergen Reactions  . Ace Inhibitors Hives  . Beta Adrenergic Blockers     Junctional bradycardia  . Gabapentin Hives  . Lisinopril Hives  . Lyrica [Pregabalin] Hives  . Shrimp [Shellfish Allergy] Swelling    Swelling of the lips   Prior to Admission medications   Medication Sig Start Date End Date Taking? Authorizing Provider  albuterol (VENTOLIN HFA) 108 (90 Base) MCG/ACT inhaler INHALE 2 PUFFS BY MOUTH EVERY 6 HOURS AS NEEDED FOR WHEEZING 01/02/19  Yes Karen [name]  apixaban (ELIQUIS) 5 MG TABS tablet Take 1 tablet (5 mg total) by mouth 2 (two) times daily. 04/09/19  Yes Karen Merritts, [name]  atorvastatin (LIPITOR) 10 MG tablet Take 1 tablet by mouth daily.   Yes Karen [name]  esomeprazole (NEXIUM) 40 MG capsule Take 40 mg by mouth daily. 12/17/18  Yes Karen [name]  Ferrous Sulfate (IRON) 325 (65 Fe) MG TABS Take 1 tablet by mouth daily. 09/14/17  Yes Karen [name]  fluticasone (FLONASE) 50 MCG/ACT nasal spray Place 2 sprays into both nostrils daily. 11/15/18  Yes Karen [name]  Fluticasone-Umeclidin-Vilant 100-62.5-25 MCG/INH AEPB Inhale 1 puff into the lungs daily. 12/25/17  Yes Karen [name]  furosemide (LASIX) 20 MG tablet Take 2 tablets (40 mg total) by mouth as directed. Take 2 tablets (40 mg) in the AM and extra 1-2 tablets (20-40 mg) in the afternoon for leg swelling or shortness of breath 07/07/19 10/05/19 Yes Gollan, Kathlene November, [name]  Insulin Human (INSULIN PUMP) SOLN Inject 1 each into the skin 3 times daily with meals, bedtime and 2 AM. [name] taking differently: Inject 1 each into the skin 3 times daily with meals, bedtime and 2 AM. Humalog Insulin 0-76 units daily insulin pump 09/18/17  Yes Karen Flock, [name]  ipratropium-albuterol (DUONEB) 0.5-2.5 (3) MG/3ML SOLN Inhale 3 mLs into the lungs 4 (four) times daily. 02/05/19  Yes Provider,  Historical, [name]  losartan-hydrochlorothiazide (HYZAAR) 100-25 MG tablet Take 1 tablet by mouth daily. 01/05/19  Yes Karen [name]  metFORMIN (GLUCOPHAGE) 500 MG tablet Take 1,000 mg by mouth 2 (two) times daily with a meal.    Yes Karen [name]  montelukast (SINGULAIR) 10 MG tablet Take 10 mg by mouth at bedtime.   Yes Karen [name]  potassium chloride (KLOR-CON) 10 MEQ tablet Take 1 tablet (10 mEq total) by mouth as directed. Take one tablet daily and when you take extra furosemide take extra tablet 07/07/19  Yes Gollan, Kathlene November, [name]  vitamin B-12 (CYANOCOBALAMIN) 500 MCG tablet Take 500 mcg by mouth daily.   Yes Karen [name]     Review of Systems  Constitutional: Positive for fatigue. Negative for appetite change.  HENT: Negative for congestion, postnasal drip and sore throat.   Eyes: Negative.   Respiratory: Negative for cough and shortness  of breath.   Cardiovascular: Positive for leg swelling. Negative for chest pain and palpitations.  Gastrointestinal: Negative for abdominal distention and abdominal pain.  Endocrine: Negative.   Genitourinary: Negative.   Musculoskeletal: Negative for back pain and neck pain.  Allergic/Immunologic: Negative.   Neurological: Negative for dizziness and light-headedness.  Hematological: Negative for adenopathy. Does not bruise/bleed easily.  Psychiatric/Behavioral: Negative for dysphoric mood and sleep disturbance (sleeping on 2 pillows). The [name] is not nervous/anxious.    Vitals:   08/12/19 1343  BP: (!) 145/48  Pulse: 83  Resp: 15  SpO2: 90%  Weight: 227 lb 4 oz (103.1 kg)  Height: 5\' 3"  (1.6 m)  PF: (!) 3 L/min   Wt Readings from Last 3 Encounters:  08/12/19 227 lb 4 oz (103.1 kg)  07/14/19 218 lb (98.9 kg)  07/07/19 224 lb 4 oz (101.7 kg)   Lab Results  Component Value Date   CREATININE 0.88 07/14/2019   CREATININE 0.75 06/29/2019   CREATININE 0.78 06/28/2019    Physical Exam Vitals  and nursing note reviewed.  Constitutional:      Appearance: Normal appearance.  HENT:     Head: Normocephalic and atraumatic.  Cardiovascular:     Rate and Rhythm: Normal rate and regular rhythm.  Pulmonary:     Effort: Pulmonary effort is normal. No respiratory distress.     Breath sounds: No wheezing or rales.  Abdominal:     General: There is no distension.     Palpations: Abdomen is soft.  Musculoskeletal:        General: No tenderness.     Cervical back: Normal range of motion.     Right lower leg: Edema (1+ pitting) present.     Left lower leg: Edema (1+ pitting) present.  Skin:    General: Skin is warm and dry.  Neurological:     General: No focal deficit present.     Mental Status: She is alert and oriented to person, place, and time.  Psychiatric:        Mood and Affect: Mood normal.        Behavior: Behavior normal.     Assessment & Plan:  1: Chronic heart failure with preserved ejection fraction without structural changes- - NYHA class II - euvolemic today - weighing daily; reminded to call for an overnight weight gain of >2 pounds or a weekly weight gain of >5 pounds - weight up 8 pounds from her last visit here 2 months ago - due to swelling in her legs advised [name] to go ahead and take an additional furosemide when she gets home; [name] does say that she's had her legs down a lot today - not adding salt to her food - saw cardiology Rockey Situ) 07/07/19 - participating in paramedicine program - BNP 06/28/19 was 40.0 - PharmD reconciled medications with the [name] - has received both her COVID vaccines    2: HTN- - BP looks good today - saw PCP (Olmedo) 07/11/19 - BMP 07/14/19 reviewed and showed sodium 132, potassium 3.7, creatinine 0.88 and GFR >60  3: DM- - saw endocrinology Pasty Arch) 06/17/19 - A1c 06/17/19 was 7.9% - using Libre freestyle meter to check her glucose levels  4: COPD- - saw pulmonology Raul Del) 03/19/19 - wearing oxygen at 3L around  the clock    [name] did not bring her medications nor a list. Each medication was verbally reviewed with the [name] and she was encouraged to bring the bottles to every visit to confirm accuracy  of list.  Return in 6 months or sooner for any questions/problems before then.

## 2019-09-03 ENCOUNTER — Telehealth (HOSPITAL_COMMUNITY): Payer: Self-pay

## 2019-09-03 NOTE — Telephone Encounter (Signed)
Had a telephone visit with Karen [name],  She sounded good.  She has agreed to a home visit next week.  She states still goes up and down on weight but has been staying in the 3 lbs.  She tries to eat low sodium but gets confused easily of what to eat.  She does very good when get the mom[employer] meals delivery.   She has all her medications and aware of how to take them.  She is aware of up coming appts.  Lives with her husband that helps her a lot with transportation and things around the house.  She has everything for everyday living.  She denies any problems today, denies chest pain or shortness of breath.  She states swelling in legs are not bad.  Will visit at home next week.  Will continue to visit for heart failure, diet and medication management.   [place] [phone]

## 2019-09-10 ENCOUNTER — Other Ambulatory Visit (HOSPITAL_COMMUNITY): Payer: Self-pay

## 2019-09-10 ENCOUNTER — Encounter (HOSPITAL_COMMUNITY): Payer: Self-pay

## 2019-09-10 NOTE — Progress Notes (Signed)
Bgl:240  She would not tell me her weight, just that it was up some.  Had a home visit today.  She states she weighs daily and when weight is up 3 lbs or more she takes extra lasix.  Her legs are down today, more than her normal.  She states feels good today.  She denies dizziness, headaches, chest pain or shortness of breath.  She is always on 3 lpm oxygen.  She is able to get around with rollator.  Her husband lives with her and helps her out.  She tries to watch high sodium foods but has difficulty in it.  She enjoyed the Moms meals and would love to get them back.  Blood sugar is a little elevated today, she had just ate breakfast.  She has all her medications, verified them.  She is aware of how to take them and when.  She is aware of up coming appts.  Will continue to visit for heart failure.   [place] [phone]

## 2019-09-27 ENCOUNTER — Encounter: Payer: Self-pay | Admitting: Intensive Care

## 2019-09-27 ENCOUNTER — Emergency Department: Payer: Medicare Other

## 2019-09-27 ENCOUNTER — Other Ambulatory Visit: Payer: Self-pay

## 2019-09-27 ENCOUNTER — Inpatient Hospital Stay
Admission: EM | Admit: 2019-09-27 | Discharge: 2019-10-02 | DRG: 291 | Disposition: A | Discharge status: Home / Self Care | Payer: Medicare Other | Attending: Hospitalist | Admitting: Hospitalist

## 2019-09-27 DIAGNOSIS — Z20822 Contact with and (suspected) exposure to covid-19: Secondary | ICD-10-CM | POA: Diagnosis present

## 2019-09-27 DIAGNOSIS — Z888 Allergy status to other drugs, medicaments and biological substances status: Secondary | ICD-10-CM | POA: Diagnosis not present

## 2019-09-27 DIAGNOSIS — E1165 Type 2 diabetes mellitus with hyperglycemia: Secondary | ICD-10-CM | POA: Diagnosis present

## 2019-09-27 DIAGNOSIS — D509 Iron deficiency anemia, unspecified: Secondary | ICD-10-CM | POA: Diagnosis present

## 2019-09-27 DIAGNOSIS — Z9981 Dependence on supplemental oxygen: Secondary | ICD-10-CM

## 2019-09-27 DIAGNOSIS — R1031 Right lower quadrant pain: Secondary | ICD-10-CM

## 2019-09-27 DIAGNOSIS — I5031 Acute diastolic (congestive) heart failure: Secondary | ICD-10-CM | POA: Diagnosis present

## 2019-09-27 DIAGNOSIS — J441 Chronic obstructive pulmonary disease with (acute) exacerbation: Secondary | ICD-10-CM | POA: Diagnosis present

## 2019-09-27 DIAGNOSIS — I5030 Unspecified diastolic (congestive) heart failure: Secondary | ICD-10-CM | POA: Diagnosis present

## 2019-09-27 DIAGNOSIS — E669 Obesity, unspecified: Secondary | ICD-10-CM | POA: Diagnosis present

## 2019-09-27 DIAGNOSIS — E785 Hyperlipidemia, unspecified: Secondary | ICD-10-CM | POA: Diagnosis present

## 2019-09-27 DIAGNOSIS — Z66 Do not resuscitate: Secondary | ICD-10-CM | POA: Diagnosis present

## 2019-09-27 DIAGNOSIS — E538 Deficiency of other specified B group vitamins: Secondary | ICD-10-CM | POA: Diagnosis present

## 2019-09-27 DIAGNOSIS — I5033 Acute on chronic diastolic (congestive) heart failure: Secondary | ICD-10-CM | POA: Diagnosis present

## 2019-09-27 DIAGNOSIS — I480 Paroxysmal atrial fibrillation: Secondary | ICD-10-CM | POA: Diagnosis present

## 2019-09-27 DIAGNOSIS — D631 Anemia in chronic kidney disease: Secondary | ICD-10-CM | POA: Diagnosis present

## 2019-09-27 DIAGNOSIS — I10 Essential (primary) hypertension: Secondary | ICD-10-CM | POA: Diagnosis present

## 2019-09-27 DIAGNOSIS — I509 Heart failure, unspecified: Secondary | ICD-10-CM

## 2019-09-27 DIAGNOSIS — Z86718 Personal history of other venous thrombosis and embolism: Secondary | ICD-10-CM | POA: Diagnosis not present

## 2019-09-27 DIAGNOSIS — Z92240 Personal history of inhaled steroid therapy: Secondary | ICD-10-CM

## 2019-09-27 DIAGNOSIS — Z79899 Other long term (current) drug therapy: Secondary | ICD-10-CM | POA: Diagnosis not present

## 2019-09-27 DIAGNOSIS — Z91013 Allergy to seafood: Secondary | ICD-10-CM

## 2019-09-27 DIAGNOSIS — E611 Iron deficiency: Secondary | ICD-10-CM | POA: Diagnosis present

## 2019-09-27 DIAGNOSIS — Z9641 Presence of insulin pump (external) (internal): Secondary | ICD-10-CM | POA: Diagnosis present

## 2019-09-27 DIAGNOSIS — Z7901 Long term (current) use of anticoagulants: Secondary | ICD-10-CM

## 2019-09-27 DIAGNOSIS — J449 Chronic obstructive pulmonary disease, unspecified: Secondary | ICD-10-CM | POA: Diagnosis present

## 2019-09-27 DIAGNOSIS — Z6841 Body Mass Index (BMI) 40.0 and over, adult: Secondary | ICD-10-CM | POA: Diagnosis not present

## 2019-09-27 DIAGNOSIS — Z87891 Personal history of nicotine dependence: Secondary | ICD-10-CM | POA: Diagnosis not present

## 2019-09-27 DIAGNOSIS — J9611 Chronic respiratory failure with hypoxia: Secondary | ICD-10-CM | POA: Diagnosis present

## 2019-09-27 DIAGNOSIS — E871 Hypo-osmolality and hyponatremia: Secondary | ICD-10-CM | POA: Diagnosis present

## 2019-09-27 DIAGNOSIS — I130 Hypertensive heart and chronic kidney disease with heart failure and stage 1 through stage 4 chronic kidney disease, or unspecified chronic kidney disease: Principal | ICD-10-CM | POA: Diagnosis present

## 2019-09-27 DIAGNOSIS — E1122 Type 2 diabetes mellitus with diabetic chronic kidney disease: Secondary | ICD-10-CM | POA: Diagnosis present

## 2019-09-27 DIAGNOSIS — J9621 Acute and chronic respiratory failure with hypoxia: Secondary | ICD-10-CM | POA: Diagnosis present

## 2019-09-27 DIAGNOSIS — E119 Type 2 diabetes mellitus without complications: Secondary | ICD-10-CM

## 2019-09-27 LAB — COMPREHENSIVE METABOLIC PANEL
ALT: 11 U/L (ref 0–44)
AST: 14 U/L — ABNORMAL LOW (ref 15–41)
Albumin: 3.6 g/dL (ref 3.5–5.0)
Alkaline Phosphatase: 68 U/L (ref 38–126)
Anion gap: 12 (ref 5–15)
BUN: 18 mg/dL (ref 8–23)
CO2: 29 mmol/L (ref 22–32)
Calcium: 8.7 mg/dL — ABNORMAL LOW (ref 8.9–10.3)
Chloride: 89 mmol/L — ABNORMAL LOW (ref 98–111)
Creatinine, Ser: 0.85 mg/dL (ref 0.44–1.00)
GFR calc Af Amer: >60 mL/min (ref >60)
GFR calc non Af Amer: >60 mL/min (ref >60)
Glucose, Bld: 185 mg/dL — ABNORMAL HIGH (ref 70–99)
Potassium: 3.6 mmol/L (ref 3.5–5.1)
Sodium: 130 mmol/L — ABNORMAL LOW (ref 135–145)
Total Bilirubin: 0.6 mg/dL (ref 0.3–1.2)
Total Protein: 6.8 g/dL (ref 6.5–8.1)

## 2019-09-27 LAB — CBC WITH DIFFERENTIAL/PLATELET
Abs Immature Granulocytes: 0.08 10*3/uL — ABNORMAL HIGH (ref 0.00–0.07)
Basophils Absolute: 0.1 10*3/uL (ref 0.0–0.1)
Basophils Relative: 1 %
Eosinophils Absolute: 0.1 10*3/uL (ref 0.0–0.5)
Eosinophils Relative: 1 %
HCT: 28.1 % — ABNORMAL LOW (ref 36.0–46.0)
Hemoglobin: 8.5 g/dL — ABNORMAL LOW (ref 12.0–15.0)
Immature Granulocytes: 1 %
Lymphocytes Relative: 11 %
Lymphs Abs: 1.3 10*3/uL (ref 0.7–4.0)
MCH: 24.3 pg — ABNORMAL LOW (ref 26.0–34.0)
MCHC: 30.2 g/dL (ref 30.0–36.0)
MCV: 80.3 fL (ref 80.0–100.0)
Monocytes Absolute: 0.9 10*3/uL (ref 0.1–1.0)
Monocytes Relative: 8 %
Neutro Abs: 9.2 10*3/uL — ABNORMAL HIGH (ref 1.7–7.7)
Neutrophils Relative %: 78 %
Platelets: 307 10*3/uL (ref 150–400)
RBC: 3.5 MIL/uL — ABNORMAL LOW (ref 3.87–5.11)
RDW: 16.7 % — ABNORMAL HIGH (ref 11.5–15.5)
WBC: 11.7 10*3/uL — ABNORMAL HIGH (ref 4.0–10.5)
nRBC: 0 % (ref 0.0–0.2)

## 2019-09-27 LAB — BRAIN NATRIURETIC PEPTIDE: B Natriuretic Peptide: 88.5 pg/mL (ref 0.0–100.0)

## 2019-09-27 LAB — GLUCOSE, CAPILLARY: Glucose-Capillary: 338 mg/dL — ABNORMAL HIGH (ref 70–99)

## 2019-09-27 MED ORDER — FUROSEMIDE 10 MG/ML IJ SOLN
80.0000 mg | Freq: Once | INTRAMUSCULAR | Status: AC
  Administered 2019-09-28: 80 mg via INTRAVENOUS
  Filled 2019-09-27: qty 8

## 2019-09-27 NOTE — H&P (Signed)
History and Physical    Karen [name] XKG:[id number] DOB: [birth year] DOA: 09/27/2019  PCP: Ricardo Jericho, NP   Patient coming from: home Chief Complaint: sob, LE edema  HPI: [name] is a 74 y.o. female with a pertinent history of COPD on 3 L of oxygen and inhalers, type 2 diabetes, hypertension, diastolic heart failure who presents to the ED with worsening shortness of breath with exertion and LE edema.    Over the last couple days, she has noticed increased shortness of breath with exertion and lower extremity edema.  She wonders if she is coming down with a cold, she has had both Covid vaccines.  She is coughing a little bit more but denies any increase of phlegm, she has noticed more of a cough and some wheezing.  Denies any sick contacts.  She has been adherent to her inhalers.  She denies any increase in fluid or salt intake.  She is having some chest pain but states it is not exertional and gets better with her inhalers.  She realized that she had gained 9 pounds despite increasing her home Lasix dose to 20 mg twice daily and then proceeded to the emergency department for further evaluation.  She states that she cannot walk as far she usually does now.  Functionally, it seems the patient lives at home alone, she uses a walker throughout the house because of her bad knees.  145/66, HR 88, rr 16, on 3L of oxygen satting 98 to 100%.  Na 130. Wbc 12, hgb 8.5 Awaiting troponin STILL ekg shows normal sinus rhythm without any ischemic changes cxr shows fluid overload IV lasix 80mg  was started  Review of Systems: As per HPI otherwise 10 point review of systems negative.  Other pertinents as below:  General -thinks she might have a cold currently, denies any fevers or chills, weight gain as above HEENT -denies any sore throat or rhinorrhea but has a cough Cardio -currently no chest pain, palpitations Resp -as per HPI GI -denies any nausea, vomiting, diarrhea,  hematochezia or melena GU -denies any urinary symptoms including dysuria or frequency MSK -denies any joint or back pain Skin -denies any new skin changes Neuro -denies any new numbness or weakness Psych -does not feel depressed or anxious  Past Medical History:  Diagnosis Date  . (HFpEF) heart failure with preserved ejection fraction ([place]) 2017   a. 2017 Echo: EF 50%; b. 06/2018 Echo: EF 50-55%; c. 08/2018 Echo: EF 50-55%, Nl RV fxn. RVSP 60.65mmHg. Mild BAE. Mild to mod TR.     Marland Kitchen Acute on chronic respiratory failure with hypoxia and hypercapnia ([place]) 01/07/2015  . Anemia   . 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 Asthma   . Cataract   . CHF (congestive heart failure) ([place])   . CKD (chronic kidney disease), stage III   . COPD (chronic obstructive pulmonary disease) ([place])    (1) 06/2018 tobacco use, home 3L oxygen   . Diabetes mellitus without complication (HCC)    (1) A1C 7.7 (06/2018)  . Edema, peripheral 04/20/2014  . GI bleed 06/28/2019  . History of kidney stones   . Hyperlipidemia   . Hypertension   . Iron deficiency anemia 06/22/2014  . Junctional bradycardia    a. In setting of beta blocker therapy.  . Leucocytosis 10/19/2015  . Morbid obesity ([place])   . Overactive bladder   . Primary osteoarthritis of right knee 09/01/2016  . Sciatica 01/07/2015    Past Surgical History:  Procedure Laterality  Date  . APPENDECTOMY    . CESAREAN SECTION     x3  . CHOLECYSTECTOMY    . COLONOSCOPY WITH PROPOFOL N/A 08/28/2017   Procedure: COLONOSCOPY WITH PROPOFOL;  Surgeon: Lucilla Lame, MD;  Location: [facility] ENDOSCOPY;  Service: Endoscopy;  Laterality: N/A;  . COLONOSCOPY WITH PROPOFOL N/A 08/29/2017   Procedure: COLONOSCOPY WITH PROPOFOL;  Surgeon: Lucilla Lame, MD;  Location: [facility] ENDOSCOPY;  Service: Endoscopy;  Laterality: N/A;  . CYSTOSCOPY W/ URETERAL STENT PLACEMENT Right 09/15/2017   Procedure: CYSTOSCOPY WITH RETROGRADE PYELOGRAM/URETERAL STENT PLACEMENT;  Surgeon: Cleon Gustin, MD;  Location:  [facility] ORS;  Service: Urology;  Laterality: Right;  . CYSTOSCOPY/URETEROSCOPY/HOLMIUM LASER/STENT PLACEMENT Right 10/09/2017   Procedure: CYSTOSCOPY/URETEROSCOPY/HOLMIUM LASER/STENT PLACEMENT;  Surgeon: Abbie Sons, MD;  Location: [facility] ORS;  Service: Urology;  Laterality: Right;  right Stent exchange  . ESOPHAGOGASTRODUODENOSCOPY (EGD) WITH PROPOFOL N/A 09/16/2018   Procedure: ESOPHAGOGASTRODUODENOSCOPY (EGD) WITH PROPOFOL;  Surgeon: Lin Landsman, MD;  Location: [facility];  Service: Gastroenterology;  Laterality: N/A;  . EYE SURGERY       reports that she quit smoking about 26 years ago. She has a 20.00 pack-year smoking history. She has never used smokeless tobacco. She reports that she does not drink alcohol and does not use drugs.  Allergies  Allergen Reactions  . Ace Inhibitors Hives  . Beta Adrenergic Blockers     Junctional bradycardia  . Gabapentin Hives  . Lisinopril Hives  . Lyrica [Pregabalin] Hives  . Shrimp [Shellfish Allergy] Swelling    Swelling of the lips    Family History  Problem Relation Age of Onset  . Other Mother        unknown medical history  . Other Father        unknown medical history    Prior to Admission medications   Medication Sig Start Date End Date Taking? Authorizing Provider  albuterol (VENTOLIN HFA) 108 (90 Base) MCG/ACT inhaler INHALE 2 PUFFS BY MOUTH EVERY 6 HOURS AS NEEDED FOR WHEEZING 01/02/19   [provider]  apixaban (ELIQUIS) 5 MG TABS tablet Take 1 tablet (5 mg total) by mouth 2 (two) times daily. 04/09/19   [name], MD  atorvastatin (LIPITOR) 10 MG tablet Take 1 tablet by mouth daily.    [provider]  esomeprazole (NEXIUM) 40 MG capsule Take 40 mg by mouth daily. 12/17/18   [provider]  Ferrous Sulfate (IRON) 325 (65 Fe) MG TABS Take 1 tablet by mouth daily. 09/14/17   [provider]  fluticasone (FLONASE) 50 MCG/ACT nasal spray Place 2 sprays into both nostrils daily.  11/15/18   [provider]  Fluticasone-Umeclidin-Vilant 100-62.5-25 MCG/INH AEPB Inhale 1 puff into the lungs daily. 12/25/17   [provider]  furosemide (LASIX) 20 MG tablet Take 2 tablets (40 mg total) by mouth as directed. Take 2 tablets (40 mg) in the AM and extra 1-2 tablets (20-40 mg) in the afternoon for leg swelling or shortness of breath 07/07/19 10/05/19  [name], MD  Insulin Human (INSULIN PUMP) SOLN Inject 1 each into the skin 3 times daily with meals, bedtime and 2 AM. Patient taking differently: Inject 1 each into the skin 3 times daily with meals, bedtime and 2 AM. Humalog Insulin 0-76 units daily insulin pump 09/18/17   Dustin Flock, MD  ipratropium-albuterol (DUONEB) 0.5-2.5 (3) MG/3ML SOLN Inhale 3 mLs into the lungs 4 (four) times daily. 02/05/19   [provider]  losartan-hydrochlorothiazide (HYZAAR) 100-25 MG  tablet Take 1 tablet by mouth daily. 01/05/19   [provider]  metFORMIN (GLUCOPHAGE) 500 MG tablet Take 1,000 mg by mouth 2 (two) times daily with a meal.     [provider]  montelukast (SINGULAIR) 10 MG tablet Take 10 mg by mouth at bedtime.    [provider]  potassium chloride (KLOR-CON) 10 MEQ tablet Take 1 tablet (10 mEq total) by mouth as directed. Take one tablet daily and when you take extra furosemide take extra tablet 07/07/19   [name], MD  vitamin B-12 (CYANOCOBALAMIN) 500 MCG tablet Take 500 mcg by mouth daily.    [provider]    Physical Exam: Vitals:   09/27/19 1556 09/27/19 1832 09/27/19 2027 09/28/19 0116  BP: (!) 145/66 (!) 129/65 (!) 131/84 (!) 157/60  Pulse: 88 86 88 98  Resp: 16 16 18    Temp:  98.2 F (36.8 C) 98.6 F (37 C)   TempSrc:  Oral Oral   SpO2: 100% 98% 98% 95%  Weight:      Height:        Constitutional: NAD, comfortable, nontoxic Eyes: pupils equal and reactive to light, anicteric, without injection ENMT: MMM, throat without exudates or  erythema Neck: normal, supple, no masses, no thyromegaly noted Respiratory: Some bibasilar Rales, upper airway wheezing bilaterally Cardiovascular: rrr w/o mrg, warm extremities Abdomen: NBS, NT, obese, denies any suprapubic tenderness Musculoskeletal: moving all 4 extremities, strength grossly intact 5/5 in the UE and LE's, symmetric lower extremity edema with some mild signs of venous stasis dermatitis Skin: no rashes, lesions, ulcers. No induration Neurologic: CN 2-12 grossly intact. Sensation intact Psychiatric: AO appearing, mentation appropriate  Labs on Admission: I have personally reviewed following labs and imaging studies  CBC: Recent Labs  Lab 09/27/19 1325  WBC 11.7*  NEUTROABS 9.2*  HGB 8.5*  HCT 28.1*  MCV 80.3  PLT 211   Basic Metabolic Panel: Recent Labs  Lab 09/27/19 1325  NA 130*  K 3.6  CL 89*  CO2 29  GLUCOSE 185*  BUN 18  CREATININE 0.85  CALCIUM 8.7*   GFR: Estimated Creatinine Clearance: 66.4 mL/min (by C-G formula based on SCr of 0.85 mg/dL). Liver Function Tests: Recent Labs  Lab 09/27/19 1325  AST 14*  ALT 11  ALKPHOS 68  BILITOT 0.6  PROT 6.8  ALBUMIN 3.6   No results for input(s): LIPASE, AMYLASE in the last 168 hours. No results for input(s): AMMONIA in the last 168 hours. Coagulation Profile: No results for input(s): INR, PROTIME in the last 168 hours. Cardiac Enzymes: No results for input(s): CKTOTAL, CKMB, CKMBINDEX, TROPONINI in the last 168 hours. BNP (last 3 results) No results for input(s): PROBNP in the last 8760 hours. HbA1C: No results for input(s): HGBA1C in the last 72 hours. CBG: Recent Labs  Lab 09/27/19 2210  GLUCAP 338*   Lipid Profile: No results for input(s): CHOL, HDL, LDLCALC, TRIG, CHOLHDL, LDLDIRECT in the last 72 hours. Thyroid Function Tests: No results for input(s): TSH, T4TOTAL, FREET4, T3FREE, THYROIDAB in the last 72 hours. Anemia Panel: No results for input(s): VITAMINB12, FOLATE, FERRITIN,  TIBC, IRON, RETICCTPCT in the last 72 hours. Urine analysis:    Component Value Date/Time   COLORURINE YELLOW 06/29/2019 0939   APPEARANCEUR HAZY (A) 06/29/2019 0939   APPEARANCEUR Cloudy (A) 09/28/2017 1338   LABSPEC 1.015 06/29/2019 0939   LABSPEC 1.024 04/02/2014 0517   PHURINE 7.0 06/29/2019 0939   GLUCOSEU NEGATIVE 06/29/2019 0939   GLUCOSEU >=  500 04/02/2014 0517   HGBUR TRACE (A) 06/29/2019 0939   BILIRUBINUR NEGATIVE 06/29/2019 0939   BILIRUBINUR Negative 09/28/2017 1338   BILIRUBINUR Negative 04/02/2014 [place] 06/29/2019 0939   PROTEINUR NEGATIVE 06/29/2019 0939   NITRITE NEGATIVE 06/29/2019 0939   LEUKOCYTESUR LARGE (A) 06/29/2019 0939   LEUKOCYTESUR Trace 04/02/2014 0517    Radiological Exams on Admission: DG Chest 2 View  Result Date: 09/27/2019 CLINICAL DATA:  Shortness of breath, 8 pounds weight gain 3 days, history CHF, stage III chronic kidney disease, COPD, diabetes mellitus, hypertension EXAM: CHEST - 2 VIEW COMPARISON:  06/01/2019 FINDINGS: Enlargement of cardiac silhouette with pulmonary vascular congestion. Atherosclerotic calcification aorta. Interstitial prominence in the mid to lower lungs likely representing minimal pulmonary edema. No segmental consolidation, pleural effusion or pneumothorax. Bones demineralized. IMPRESSION: Enlargement of cardiac silhouette with pulmonary vascular congestion and probable minimal pulmonary edema. Aortic Atherosclerosis (ICD10-I70.0). Electronically Signed   By: Lavonia Dana M.D.   On: 09/27/2019 14:00    EKG: Independently reviewed. NSR without ischemic changes  Assessment/Plan Principal Problem:   Acute diastolic CHF (congestive heart failure) (HCC) Active Problems:   (HFpEF) heart failure with preserved ejection fraction (HCC)   COPD with acute exacerbation (HCC)   Diabetes mellitus type 2, uncomplicated (HCC)   Hypertension   Iron deficiency anemia   Obesity (BMI 35.0-39.9 without comorbidity)    Chronic respiratory failure with hypoxia (HCC)  SOB suspect mostly from a COPD exacerbation causing a mild to moderate heart failure exacerbation with an upper respiratory infection.  She has had both a Covid vaccine so I doubt it is Covid but I am sure she will still get tested for Covid.  Patient is on home iron and I question a component of Iron deficiency given her anemia, ferritin 56 in 09/16/2019 and we will repeat this today. -I think patient still needs to be med rec, make sure she was taking her iron at home -Duo nebs 3 times daily -BNP of 80 but because the patient is obese I think that it could be less than 100 -Given she is in heart failure, will only do prednisone 20 mg for 4 to 5 days to limit fluid retention -In the emergency department she was not necessarily measuring her urine and had good urine output with 80 mg of IV, will repeat at 8 AM.  Strict ins and outs, daily weights -Follow-up troponin, no ischemic changes on his EKG, patient has some chest pain but she states that he gets better with her inhaler, continue to monitor -TTE since the last was over a year ago --Because it seems to be a mild exacerbation will not add antibiotics at this time  COPD, exacerbation as above Chronic hypoxic respiratory failure on 3 L oxygen at home, wean oxygen as able Inhalers as above.  Continue trilogy  Hyponatremia 130, suspect will improve with optimized fluid status, ctm  Anemia, suspect component of iron deficiency, history of B12 as well  Atrial fibrillation-continue Eliquis, she states a distant history of a DVT HTN - needs to be medrecced, make sure not on HCTZ as well as furosemide.   T2DM - cont' SSI, she states typically is controlled at home, follow-up on hemoglobin A1c, for some reason recently her she is hyperglycemic.  She denies any other sources of infection  Patient and/or Family completely agreed with the plan, expressed understanding and I answered all questions.  DVT  prophylaxis: CB:[id number] Code Status: DNR.  I confirmed with the patient  that she wants to be DNR.  She states she has the "piece of paper at home "and she understands we would not offer her chest compressions or intubation Family Communication: Not indicated Disposition Plan: As long as she maintains her functionality, I suspect that she can return home using her walker Consults called: Not indicated Admission status: Inpatient because of COPD exacerbation   A total of 65 minutes utilized during this admission.  [facility]   If 7PM-7AM, please contact night-coverage [email or web address] Password [facility]  09/28/2019, 1:37 AM

## 2019-09-27 NOTE — ED Triage Notes (Signed)
Patient presents with SOB and reports gaining 8lb in 3 days. Wears 3L O2 chronically. HX CHF

## 2019-09-27 NOTE — ED Notes (Signed)
Pts vital signs updated by this RN. O2 tank changed out and warm blanket given to pt. Pt informed that we are waiting for treatment rooms to open up. Pt denies any other needs at this time.

## 2019-09-27 NOTE — ED Notes (Signed)
Patient given update on wait time. Patient verbalizes understanding.  

## 2019-09-27 NOTE — ED Notes (Signed)
Pt O2 tank replaced with new tank

## 2019-09-27 NOTE — ED Provider Notes (Signed)
[facility] Emergency Department Provider Note ____________________________________________   First MD Initiated Contact with Patient 09/27/19 2206     (approximate)  I have reviewed the triage vital signs and the nursing notes.   HISTORY  Chief Complaint Shortness of Breath and Weight Gain    HPI Karen [name] is a 74 y.o. female with PMH as noted below including history of CHF who presents with worsening shortness of breath over the last several days, gradual onset, and associated with significant weight gain of about 8 pounds over the last 3 days.  The patient states that she has doubled on her Lasix over the last 3 days without relief.  She reports some chest pain today, and has some nonproductive cough which is chronic.  She has no fever or chills or any vomiting or diarrhea.  Past Medical History:  Diagnosis Date  . (HFpEF) heart failure with preserved ejection fraction ([place]) 2017   a. 2017 Echo: EF 50%; b. 06/2018 Echo: EF 50-55%; c. 08/2018 Echo: EF 50-55%, Nl RV fxn. RVSP 60.79mmHg. Mild BAE. Mild to mod TR.     Marland Kitchen Acute on chronic respiratory failure with hypoxia and hypercapnia ([place]) 01/07/2015  . Anemia   . 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 Asthma   . Cataract   . CHF (congestive heart failure) ([place])   . CKD (chronic kidney disease), stage III   . COPD (chronic obstructive pulmonary disease) ([place])    (1) 06/2018 tobacco use, home 3L oxygen   . Diabetes mellitus without complication (HCC)    (1) A1C 7.7 (06/2018)  . Edema, peripheral 04/20/2014  . GI bleed 06/28/2019  . History of kidney stones   . Hyperlipidemia   . Hypertension   . Iron deficiency anemia 06/22/2014  . Junctional bradycardia    a. In setting of beta blocker therapy.  . Leucocytosis 10/19/2015  . Morbid obesity ([place])   . Overactive bladder   . Primary osteoarthritis of right knee 09/01/2016  . Sciatica 01/07/2015    Patient Active Problem List   Diagnosis Date Noted  . Rectal  bleeding 06/28/2019  . Hypokalemia 06/28/2019  . Hyponatremia 06/28/2019  . Type II diabetes mellitus with renal manifestations ([place]) 06/28/2019  . CKD (chronic kidney disease), stage IIIa 06/28/2019  . Atrial fibrillation, chronic ([place]) 06/28/2019  . Pulmonary edema 02/27/2019  . Bradycardia 02/24/2019  . Chronic respiratory failure with hypoxia ([place]) 02/23/2019  . Atypical chest pain 02/13/2019  . AVM (arteriovenous malformation) of small bowel, acquired   . Acute gastric ulcer with hemorrhage   . Chronic diastolic heart failure ([place]) 08/22/2018  . Diarrhea 08/22/2018  . Junctional bradycardia   . Acute on chronic heart failure with preserved ejection fraction (HFpEF) ([place])   . AKI (acute kidney injury) ([place])   . Symptomatic bradycardia 08/05/2018  . Acute on chronic respiratory failure ([place]) 06/25/2018  . 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 diabetes mellitus ([place]) 01/25/2018  . History of non anemic vitamin B12 deficiency 01/25/2018  . Personal history of kidney stones 11/12/2017  . Urge incontinence 11/12/2017  . Right ureteral stone 09/15/2017  . Acute GI bleeding   . GI bleed 08/26/2017  . Arthritis 08/10/2017  . Chronic kidney disease 08/10/2017  . COPD (chronic obstructive pulmonary disease) ([place]) 08/10/2017  . Diabetes mellitus type 2, uncomplicated ([place]) 09/47/0962  . Hypertension 08/10/2017  . Obesity (BMI 35.0-39.9 without comorbidity) 04/11/2017  . Primary osteoarthritis of right knee 09/01/2016  . Leucocytosis 10/19/2015  . Asterixis  01/07/2015  . Acute on chronic respiratory failure with hypoxia and hypercapnia ([place]) 01/07/2015  . Sciatica 01/07/2015  . Weakness 01/07/2015  . 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 Iron deficiency anemia 06/22/2014  . Microalbuminuria 06/22/2014  . CHF (congestive heart failure) ([place]) 04/20/2014  . Edema, peripheral 04/20/2014    Past Surgical History:  Procedure Laterality  Date  . APPENDECTOMY    . CESAREAN SECTION     x3  . CHOLECYSTECTOMY    . COLONOSCOPY WITH PROPOFOL N/A 08/28/2017   Procedure: COLONOSCOPY WITH PROPOFOL;  Surgeon: Lucilla Lame, MD;  Location: [facility] ENDOSCOPY;  Service: Endoscopy;  Laterality: N/A;  . COLONOSCOPY WITH PROPOFOL N/A 08/29/2017   Procedure: COLONOSCOPY WITH PROPOFOL;  Surgeon: Lucilla Lame, MD;  Location: [facility] ENDOSCOPY;  Service: Endoscopy;  Laterality: N/A;  . CYSTOSCOPY W/ URETERAL STENT PLACEMENT Right 09/15/2017   Procedure: CYSTOSCOPY WITH RETROGRADE PYELOGRAM/URETERAL STENT PLACEMENT;  Surgeon: Cleon Gustin, MD;  Location: [facility] ORS;  Service: Urology;  Laterality: Right;  . CYSTOSCOPY/URETEROSCOPY/HOLMIUM LASER/STENT PLACEMENT Right 10/09/2017   Procedure: CYSTOSCOPY/URETEROSCOPY/HOLMIUM LASER/STENT PLACEMENT;  Surgeon: Abbie Sons, MD;  Location: [facility] ORS;  Service: Urology;  Laterality: Right;  right Stent exchange  . ESOPHAGOGASTRODUODENOSCOPY (EGD) WITH PROPOFOL N/A 09/16/2018   Procedure: ESOPHAGOGASTRODUODENOSCOPY (EGD) WITH PROPOFOL;  Surgeon: Lin Landsman, MD;  Location: [place];  Service: Gastroenterology;  Laterality: N/A;  . EYE SURGERY      Prior to Admission medications   Medication Sig Start Date End Date Taking? Authorizing Provider  albuterol (VENTOLIN HFA) 108 (90 Base) MCG/ACT inhaler INHALE 2 PUFFS BY MOUTH EVERY 6 HOURS AS NEEDED FOR WHEEZING 01/02/19   [provider]  apixaban (ELIQUIS) 5 MG TABS tablet Take 1 tablet (5 mg total) by mouth 2 (two) times daily. 04/09/19   [name], MD  atorvastatin (LIPITOR) 10 MG tablet Take 1 tablet by mouth daily.    [provider]  esomeprazole (NEXIUM) 40 MG capsule Take 40 mg by mouth daily. 12/17/18   [provider]  Ferrous Sulfate (IRON) 325 (65 Fe) MG TABS Take 1 tablet by mouth daily. 09/14/17   [provider]  fluticasone (FLONASE) 50 MCG/ACT nasal spray Place 2 sprays into both nostrils  daily. 11/15/18   [provider]  Fluticasone-Umeclidin-Vilant 100-62.5-25 MCG/INH AEPB Inhale 1 puff into the lungs daily. 12/25/17   [provider]  furosemide (LASIX) 20 MG tablet Take 2 tablets (40 mg total) by mouth as directed. Take 2 tablets (40 mg) in the AM and extra 1-2 tablets (20-40 mg) in the afternoon for leg swelling or shortness of breath 07/07/19 10/05/19  [name], MD  Insulin Human (INSULIN PUMP) SOLN Inject 1 each into the skin 3 times daily with meals, bedtime and 2 AM. Patient taking differently: Inject 1 each into the skin 3 times daily with meals, bedtime and 2 AM. Humalog Insulin 0-76 units daily insulin pump 09/18/17   Dustin Flock, MD  ipratropium-albuterol (DUONEB) 0.5-2.5 (3) MG/3ML SOLN Inhale 3 mLs into the lungs 4 (four) times daily. 02/05/19   [provider]  losartan-hydrochlorothiazide (HYZAAR) 100-25 MG tablet Take 1 tablet by mouth daily. 01/05/19   [provider]  metFORMIN (GLUCOPHAGE) 500 MG tablet Take 1,000 mg by mouth 2 (two) times daily with a meal.     [provider]  montelukast (SINGULAIR) 10 MG tablet Take 10 mg by mouth at bedtime.    [provider]  potassium chloride (  KLOR-CON) 10 MEQ tablet Take 1 tablet (10 mEq total) by mouth as directed. Take one tablet daily and when you take extra furosemide take extra tablet 07/07/19   [name], MD  vitamin B-12 (CYANOCOBALAMIN) 500 MCG tablet Take 500 mcg by mouth daily.    [provider]    Allergies Ace inhibitors, Beta adrenergic blockers, Gabapentin, Lisinopril, Lyrica [pregabalin], and Shrimp [shellfish allergy]  Family History  Problem Relation Age of Onset  . Other Mother        unknown medical history  . Other Father        unknown medical history    Social History Social History   Tobacco Use  . Smoking status: Former Smoker    Packs/day: 1.00    Years: 20.00    Pack years: 20.00    Quit date: 12/04/1992     Years since quitting: 26.8  . Smokeless tobacco: Never Used  Vaping Use  . Vaping Use: Never used  Substance Use Topics  . Alcohol use: No  . Drug use: No    Review of Systems  Constitutional: No fever/chills. Eyes: No redness. ENT: No sore throat. Cardiovascular: Positive for chest pain. Respiratory: Positive for shortness of breath. Gastrointestinal: No vomiting or diarrhea.  Genitourinary: Negative for dysuria.  Musculoskeletal: Negative for back pain. Skin: Negative for rash. Neurological: Negative for headache.   ____________________________________________   PHYSICAL EXAM:  VITAL SIGNS: ED Triage Vitals  Enc Vitals Group     BP 09/27/19 1320 (!) 126/62     Pulse Rate 09/27/19 1320 92     Resp 09/27/19 1320 22     Temp 09/27/19 1320 98.2 F (36.8 C)     Temp Source 09/27/19 1320 Oral     SpO2 09/27/19 1319 97 %     Weight 09/27/19 1321 (!) 232 lb (105.2 kg)     Height 09/27/19 1321 5\' 3"  (1.6 m)     Head Circumference --      Peak Flow --      Pain Score 09/27/19 1321 0     Pain Loc --      Pain Edu? --      Excl. in [place]? --     Constitutional: Alert and oriented. Well appearing and in no acute distress. Eyes: Conjunctivae are normal.  Head: Atraumatic. Nose: No congestion/rhinnorhea. Mouth/Throat: Mucous membranes are moist.   Neck: Normal range of motion.  Cardiovascular: Normal rate, regular rhythm. Grossly normal heart sounds.  Good peripheral circulation. Respiratory: Increased respiratory effort with some accessory muscle use.  Decreased breath sounds with bilateral rales. Gastrointestinal: No distention.  Musculoskeletal: 2+ bilateral lower extremity edema.  Extremities warm and well perfused.  Neurologic:  Normal speech and language. No gross focal neurologic deficits are appreciated.  Skin:  Skin is warm and dry. No rash noted. Psychiatric: Mood and affect are normal. Speech and behavior are  normal.  ____________________________________________   LABS (all labs ordered are listed, but only abnormal results are displayed)  Labs Reviewed  CBC WITH DIFFERENTIAL/PLATELET - Abnormal; Notable for the following components:      Result Value   WBC 11.7 (*)    RBC 3.50 (*)    Hemoglobin 8.5 (*)    HCT 28.1 (*)    MCH 24.3 (*)    RDW 16.7 (*)    Neutro Abs 9.2 (*)    Abs Immature Granulocytes 0.08 (*)    All other components within normal limits  COMPREHENSIVE METABOLIC PANEL -  Abnormal; Notable for the following components:   Sodium 130 (*)    Chloride 89 (*)    Glucose, Bld 185 (*)    Calcium 8.7 (*)    AST 14 (*)    All other components within normal limits  GLUCOSE, CAPILLARY - Abnormal; Notable for the following components:   Glucose-Capillary 338 (*)    All other components within normal limits  SARS CORONAVIRUS 2 BY RT PCR (HOSPITAL ORDER, [place] [facility])  BRAIN NATRIURETIC PEPTIDE  TROPONIN I (HIGH SENSITIVITY)   ____________________________________________  EKG  ED ECG REPORT I, Arta Silence, the attending physician, personally viewed and interpreted this ECG.  Date: 09/27/2019 EKG Time: 1324 Rate: 90 Rhythm: normal sinus rhythm QRS Axis: normal Intervals: normal ST/T Wave abnormalities: normal Narrative Interpretation: no evidence of acute ischemia  ____________________________________________  RADIOLOGY  CXR: Vascular congestion and mild edema  ____________________________________________   PROCEDURES  Procedure(s) performed: No  Procedures  Critical Care performed: No ____________________________________________   INITIAL IMPRESSION / ASSESSMENT AND PLAN / ED COURSE  Pertinent labs & imaging results that were available during my care of the patient were reviewed by me and considered in my medical decision making (see chart for details).  74 year old female with PMH as noted above including COPD and  CHF on 3 L O2 by nasal cannula at home presents with worsening shortness of breath and weight gain over the last several days.  She has doubled up on her Lasix without relief.  She also endorses some chest pain today.  On exam, the patient is overall comfortable appearing but does have increased work of breathing.  O2 saturation is in the mid to high 90s on her normal 3 L.  Her other vital signs are normal.  She does have significant peripheral edema and some rales on her lung exam.  Overall presentation is most consistent with worsening CHF, especially given the recent weight gain.  Differential also includes COPD although I do not hear significant wheezing.  Although the patient is oxygenating well given her diffuse edema and increased work of breathing, she would benefit from inpatient admission for IV diuresis.  I have ordered dose of IV Lasix in the ED.  ----------------------------------------- 11:54 PM on 09/27/2019 -----------------------------------------  Although the patient waited several hours to be seen, a troponin was not ordered from triage.  It is still pending.  However, she has no EKG changes and I have a low suspicion for ACS.  I discussed the case with Dr. Yong Channel from the hospitalist service for admission.  ____________________________________________   FINAL CLINICAL IMPRESSION(S) / ED DIAGNOSES  Final diagnoses:  Acute on chronic congestive heart failure, unspecified heart failure type ([place])      NEW MEDICATIONS STARTED DURING THIS VISIT:  New Prescriptions   No medications on file     Note:  This document was prepared using Dragon voice recognition software and may include unintentional dictation errors.   Arta Silence, MD 09/27/19 2355

## 2019-09-28 ENCOUNTER — Inpatient Hospital Stay (HOSPITAL_COMMUNITY)
Admit: 2019-09-28 | Discharge: 2019-09-28 | Disposition: A | Discharge status: Home / Self Care | Payer: Medicare Other | Attending: Internal Medicine | Admitting: Internal Medicine

## 2019-09-28 DIAGNOSIS — J441 Chronic obstructive pulmonary disease with (acute) exacerbation: Secondary | ICD-10-CM

## 2019-09-28 DIAGNOSIS — I5031 Acute diastolic (congestive) heart failure: Secondary | ICD-10-CM | POA: Diagnosis present

## 2019-09-28 HISTORY — DX: Chronic obstructive pulmonary disease with (acute) exacerbation: J44.1

## 2019-09-28 LAB — BASIC METABOLIC PANEL
Anion gap: 13 (ref 5–15)
BUN: 18 mg/dL (ref 8–23)
CO2: 32 mmol/L (ref 22–32)
Calcium: 9 mg/dL (ref 8.9–10.3)
Chloride: 89 mmol/L — ABNORMAL LOW (ref 98–111)
Creatinine, Ser: 0.88 mg/dL (ref 0.44–1.00)
GFR calc Af Amer: >60 mL/min (ref >60)
GFR calc non Af Amer: >60 mL/min (ref >60)
Glucose, Bld: 228 mg/dL — ABNORMAL HIGH (ref 70–99)
Potassium: 3.8 mmol/L (ref 3.5–5.1)
Sodium: 134 mmol/L — ABNORMAL LOW (ref 135–145)

## 2019-09-28 LAB — GLUCOSE, CAPILLARY
Glucose-Capillary: 218 mg/dL — ABNORMAL HIGH (ref 70–99)
Glucose-Capillary: 192 mg/dL — ABNORMAL HIGH (ref 70–99)
Glucose-Capillary: 341 mg/dL — ABNORMAL HIGH (ref 70–99)
Glucose-Capillary: 414 mg/dL — ABNORMAL HIGH (ref 70–99)
Glucose-Capillary: 392 mg/dL — ABNORMAL HIGH (ref 70–99)

## 2019-09-28 LAB — GLUCOSE, RANDOM: Glucose, Bld: 445 mg/dL — ABNORMAL HIGH (ref 70–99)

## 2019-09-28 LAB — TROPONIN I (HIGH SENSITIVITY)
Troponin I (High Sensitivity): 12 ng/L (ref <18)
Troponin I (High Sensitivity): 12 ng/L (ref <18)

## 2019-09-28 LAB — SARS CORONAVIRUS 2 BY RT PCR (HOSPITAL ORDER, PERFORMED IN ~~LOC~~ HOSPITAL LAB): SARS Coronavirus 2: NEGATIVE

## 2019-09-28 LAB — MAGNESIUM: Magnesium: 1.9 mg/dL (ref 1.7–2.4)

## 2019-09-28 LAB — HEMOGLOBIN A1C
Hgb A1c MFr Bld: 8.8 % — ABNORMAL HIGH (ref 4.8–5.6)
Mean Plasma Glucose: 205.86 mg/dL

## 2019-09-28 MED ORDER — FLUTICASONE-UMECLIDIN-VILANT 100-62.5-25 MCG/INH IN AEPB: 1.0000 | INHALATION_SPRAY | Freq: Every day | RESPIRATORY_TRACT | Status: DC

## 2019-09-28 MED ORDER — FLUTICASONE FUROATE-VILANTEROL 100-25 MCG/INH IN AEPB
1.0000 | INHALATION_SPRAY | Freq: Every day | RESPIRATORY_TRACT | Status: DC
  Administered 2019-09-28 – 2019-10-02 (×5): 1 via RESPIRATORY_TRACT
  Filled 2019-09-28: qty 28

## 2019-09-28 MED ORDER — INSULIN ASPART 100 UNIT/ML ~~LOC~~ SOLN
10.0000 [IU] | Freq: Once | SUBCUTANEOUS | Status: AC
  Administered 2019-09-28: 10 [IU] via SUBCUTANEOUS
  Filled 2019-09-28: qty 1

## 2019-09-28 MED ORDER — FERROUS SULFATE 325 (65 FE) MG PO TABS
325.0000 mg | ORAL_TABLET | Freq: Every day | ORAL | Status: DC
  Administered 2019-09-28 – 2019-10-02 (×5): 325 mg via ORAL
  Filled 2019-09-28 (×5): qty 1

## 2019-09-28 MED ORDER — ALBUTEROL SULFATE (2.5 MG/3ML) 0.083% IN NEBU: 2.5000 mg | INHALATION_SOLUTION | Freq: Four times a day (QID) | RESPIRATORY_TRACT | Status: DC | PRN

## 2019-09-28 MED ORDER — ORAL CARE MOUTH RINSE
15.0000 mL | Freq: Two times a day (BID) | OROMUCOSAL | Status: DC
  Administered 2019-09-28 – 2019-10-01 (×7): 15 mL via OROMUCOSAL

## 2019-09-28 MED ORDER — ALBUTEROL SULFATE (2.5 MG/3ML) 0.083% IN NEBU
3.0000 mL | INHALATION_SOLUTION | RESPIRATORY_TRACT | Status: DC | PRN
  Administered 2019-09-28: 3 mL via RESPIRATORY_TRACT
  Filled 2019-09-28: qty 3

## 2019-09-28 MED ORDER — PREDNISONE 20 MG PO TABS: 20.0000 mg | ORAL_TABLET | Freq: Every day | ORAL | Status: DC

## 2019-09-28 MED ORDER — ASPIRIN EC 81 MG PO TBEC
81.0000 mg | DELAYED_RELEASE_TABLET | Freq: Every day | ORAL | Status: DC
  Administered 2019-09-28 – 2019-10-02 (×5): 81 mg via ORAL
  Filled 2019-09-28 (×6): qty 1

## 2019-09-28 MED ORDER — CYANOCOBALAMIN 500 MCG PO TABS
500.0000 ug | ORAL_TABLET | Freq: Every day | ORAL | Status: DC
  Administered 2019-09-28 – 2019-10-02 (×5): 500 ug via ORAL
  Filled 2019-09-28 (×6): qty 1

## 2019-09-28 MED ORDER — IPRATROPIUM-ALBUTEROL 0.5-2.5 (3) MG/3ML IN SOLN
3.0000 mL | Freq: Three times a day (TID) | RESPIRATORY_TRACT | Status: DC
  Administered 2019-09-28 – 2019-10-02 (×14): 3 mL via RESPIRATORY_TRACT
  Filled 2019-09-28 (×14): qty 3

## 2019-09-28 MED ORDER — PANTOPRAZOLE SODIUM 40 MG PO TBEC
40.0000 mg | DELAYED_RELEASE_TABLET | Freq: Every day | ORAL | Status: DC
  Administered 2019-09-29 – 2019-10-02 (×4): 40 mg via ORAL
  Filled 2019-09-28 (×4): qty 1

## 2019-09-28 MED ORDER — FUROSEMIDE 10 MG/ML IJ SOLN
80.0000 mg | Freq: Once | INTRAMUSCULAR | Status: DC
  Filled 2019-09-28: qty 8

## 2019-09-28 MED ORDER — APIXABAN 5 MG PO TABS
5.0000 mg | ORAL_TABLET | Freq: Two times a day (BID) | ORAL | Status: DC
  Administered 2019-09-28 – 2019-10-02 (×9): 5 mg via ORAL
  Filled 2019-09-28 (×9): qty 1

## 2019-09-28 MED ORDER — SODIUM CHLORIDE 0.9 % IV SOLN: 250.0000 mL | INTRAVENOUS | Status: DC | PRN

## 2019-09-28 MED ORDER — ONDANSETRON HCL 4 MG/2ML IJ SOLN: 4.0000 mg | Freq: Four times a day (QID) | INTRAMUSCULAR | Status: DC | PRN

## 2019-09-28 MED ORDER — SODIUM CHLORIDE 0.9% FLUSH: 3.0000 mL | INTRAVENOUS | Status: DC | PRN

## 2019-09-28 MED ORDER — UMECLIDINIUM BROMIDE 62.5 MCG/INH IN AEPB
1.0000 | INHALATION_SPRAY | Freq: Every day | RESPIRATORY_TRACT | Status: DC
  Administered 2019-09-28 – 2019-10-02 (×5): 1 via RESPIRATORY_TRACT
  Filled 2019-09-28: qty 7

## 2019-09-28 MED ORDER — INSULIN ASPART 100 UNIT/ML ~~LOC~~ SOLN
0.0000 [IU] | Freq: Three times a day (TID) | SUBCUTANEOUS | Status: DC
  Administered 2019-09-28: 7 [IU] via SUBCUTANEOUS
  Administered 2019-09-28: 2 [IU] via SUBCUTANEOUS
  Administered 2019-09-28 – 2019-09-29 (×3): 9 [IU] via SUBCUTANEOUS
  Administered 2019-09-29: 10:00:00 7 [IU] via SUBCUTANEOUS
  Administered 2019-09-30: 09:00:00 9 [IU] via SUBCUTANEOUS
  Administered 2019-09-30 (×2): 5 [IU] via SUBCUTANEOUS
  Administered 2019-10-01: 12:00:00 9 [IU] via SUBCUTANEOUS
  Administered 2019-10-01: 5 [IU] via SUBCUTANEOUS
  Administered 2019-10-01 – 2019-10-02 (×2): 9 [IU] via SUBCUTANEOUS
  Administered 2019-10-02: 7 [IU] via SUBCUTANEOUS
  Filled 2019-09-28 (×14): qty 1

## 2019-09-28 MED ORDER — FUROSEMIDE 10 MG/ML IJ SOLN
40.0000 mg | Freq: Two times a day (BID) | INTRAMUSCULAR | Status: DC
  Administered 2019-09-28 – 2019-09-30 (×5): 40 mg via INTRAVENOUS
  Filled 2019-09-28 (×5): qty 4

## 2019-09-28 MED ORDER — MONTELUKAST SODIUM 10 MG PO TABS
10.0000 mg | ORAL_TABLET | Freq: Every day | ORAL | Status: DC
  Administered 2019-09-28 – 2019-10-01 (×5): 10 mg via ORAL
  Filled 2019-09-28 (×6): qty 1

## 2019-09-28 MED ORDER — FUROSEMIDE 10 MG/ML IJ SOLN: 40.0000 mg | Freq: Two times a day (BID) | INTRAMUSCULAR | Status: DC

## 2019-09-28 MED ORDER — ATORVASTATIN CALCIUM 20 MG PO TABS
10.0000 mg | ORAL_TABLET | Freq: Every day | ORAL | Status: DC
  Administered 2019-09-28 – 2019-10-02 (×5): 10 mg via ORAL
  Filled 2019-09-28 (×5): qty 1

## 2019-09-28 MED ORDER — FLUTICASONE PROPIONATE 50 MCG/ACT NA SUSP
2.0000 | Freq: Every day | NASAL | Status: DC
  Administered 2019-09-29 – 2019-10-02 (×4): 2 via NASAL
  Filled 2019-09-28: qty 16

## 2019-09-28 MED ORDER — SODIUM CHLORIDE 0.9% FLUSH
3.0000 mL | Freq: Two times a day (BID) | INTRAVENOUS | Status: DC
  Administered 2019-09-28 – 2019-10-02 (×9): 3 mL via INTRAVENOUS

## 2019-09-28 MED ORDER — ACETAMINOPHEN 325 MG PO TABS
650.0000 mg | ORAL_TABLET | ORAL | Status: DC | PRN
  Administered 2019-09-28 – 2019-09-29 (×2): 650 mg via ORAL
  Filled 2019-09-28 (×2): qty 2

## 2019-09-28 NOTE — ED Notes (Signed)
DNR Armband placed on patient

## 2019-09-28 NOTE — Progress Notes (Signed)
*  PRELIMINARY RESULTS* Echocardiogram 2D Echocardiogram has been performed.  Karen [name] 09/28/2019, 2:50 PM

## 2019-09-28 NOTE — ED Notes (Signed)
Pt has own insulin pump on at this time, CGM removed when XR was completed. CBG checked at this time.   Pt states she gave herself 4units of insulin for CBG of 218.

## 2019-09-28 NOTE — ED Notes (Signed)
Pt sitting up in bed drinking coffee, states that she is feeling better than what she did when she first got here, no distress noted, purewick in place and working well, cont to monitor

## 2019-09-28 NOTE — Progress Notes (Signed)
PT Cancellation Note  Patient Details Name: Karen [name] MRN: [id number] DOB: [birth year]   Cancelled Treatment:    Reason Eval/Treat Not Completed: Patient declined, no reason specified. PT re-attempted to see this patient in the afternoon. Patient asked therapy to come back tomorrow as she was fatigued and did not want to participate in mobility evaluation.    Royce Macadamia PT, DPT, CSCS    09/28/2019, 3:32 PM

## 2019-09-28 NOTE — Progress Notes (Signed)
PT Cancellation Note  Patient Details Name: Karen [name] MRN: [id number] DOB: Oct 02, 1945   Cancelled Treatment:    Reason Eval/Treat Not Completed: Patient's level of consciousness. PT entered room, patient had just fallen asleep and was not easily aroused. Will re-attempt at a later time/date.    Royce Macadamia PT, DPT, CSCS    09/28/2019, 12:10 PM

## 2019-09-28 NOTE — ED Notes (Signed)
Pt informed to remove insulin pump, verbalized understanding

## 2019-09-28 NOTE — ED Notes (Signed)
Pt placed back into bed at this time.

## 2019-09-28 NOTE — Progress Notes (Signed)
PROGRESS NOTE  Karen [name]  DOB: [birth year]  PCP: Ricardo Jerich[place], [place] EXH:[id number]  DOA: 09/27/2019  LOS: 1 day   Chief Complaint  Patient presents with  . Shortness of Breath  . Weight Gain   Brief narrative: Karen [name] is a 74 y.o. female wi PMH significant for morbid obesity, HTN, HLD, DM2, COPD on 3 L oxygen at home, chronic diastolic CHF, CKD 3, chronic anemia, history of GI bleed, iron deficiency anemia. Patient presented to the ED on 7/24 with shortness of breath with exertion and LE edema  progressively worsening for last couple days.  She has had both Covid vaccines and has been compliant to her medications including inhalers. Denies any increase in fluid or salt intake.   She realized that she had gained 9 pounds despite increasing her home Lasix dose to 20 mg twice daily and hence came to the ED.   Patient lives at home with her husband and son, she uses a walker throughout the house because of her bad knees.  In the ED, patient was afebrile, blood pressure 145/66, HR 88, RR16, on 3L of oxygen satting 98 to 100%.   Labs with Na 130. Wbc 12, hgb 8.5. Troponin negative x2. ekg shows normal sinus rhythm without any ischemic changes Chest x-ray showed enlargement of cardiac silhouette with pulmonary vascular congestion and probable minimal pulmonary edema. Patient was admitted to hospital service on IV Lasix.  Subjective: Patient was seen and examined this morning. Pleasant elderly Caucasian female, morbidly obese.  Sitting up in bed. Labs this morning with sodium level improved to 134, renal function remains normal. Per RN, patient has had more than 2 L output with 80 mg of IV Lasix he got last night.  Assessment/Plan: Progressive dyspnea Chronic respiratory with hypoxia -Progression of dyspnea is probably combination of CHF exacerbation, underlying COPD chronic deconditioning and chronic anemia. -At rest, patient is maintaining saturation on her  baseline requirement of 3 L/min. -Monitor saturation on ambulation with PT evaluation.  Acute exacerbation of diastolic CHF  Essential hypertension -Presented with worsening dyspnea and 9 pound weight gain.  -Chest x-ray with pulmonary vascular congestion and minimal pulmonary edema -last echo in 2020 with EF 50 to 55%.  Repeat echo -Home meds include losartan 100 mg daily, hydrochlorothiazide 25 mg daily, Lasix 40 mg twice daily, potassium supplement -Received IV Lasix 80 mg in the ED last night, more than 2 L output. -Start on Lasix IV 40 mg twice daily from this morning. -Keep other BP meds on hold for now. -Continue monitor blood pressure, renal function, intake output, daily weight.  COPD -Imagings did not show any evidence of pneumonia. -Patient is not wheezing at the time of my evaluation.  Not on scheduled prednisone at home. -Home meds include DuoNeb, albuterol, Flonase, fluticasone/Umeclidinium/vilant, Singulair. -Resume all. -I do not think patient needs steroids at this time.  Chronic anemia  -History of iron deficiency as well as vitamin B12 deficiency -Ferritin was low at 56 2 weeks ago. -Iron supplement and vitamin B12 supplement -Nexium 40 g daily.  Atrial fibrillation -continue Eliquis  Hyponatremia  -Sodium level low at 130 hypertension, improved on 131 morning.    T2DM Lab Results  Component Value Date   HGBA1C 8.4 (H) 02/24/2019  -On Humalog by insulin pump. -Insulin pump off at this time. -Currently on on sliding scale insulin.  -Blood sugar level 218 this morning.  I would also give 10 units Lantus at this time.   Mobility:  Encourage ambulation.  PT eval ordered. Code Status:   Code Status: DNR  Nutritional status: Body mass index is 41.1 kg/m.     Diet Order            Diet heart healthy/carb modified Room service appropriate? Yes; Fluid consistency: Thin  Diet effective now                 DVT prophylaxis: Place TED hose Start:  09/28/19 0121 apixaban (ELIQUIS) tablet 5 mg   Antimicrobials:  None Fluid: None  Consultants: None Family Communication:  None at bedside  Status is: Inpatient  Remains inpatient appropriate because:Ongoing diagnostic testing needed not appropriate for outpatient work up and IV treatments appropriate due to intensity of illness or inability to take PO   Dispo: The patient is from: Home              Anticipated d/c is to: Home              Anticipated d/c date is: 2 days              Patient currently is not medically stable to d/c.       Infusions:  . sodium chloride      Scheduled Meds: . apixaban  5 mg Oral BID  . aspirin EC  81 mg Oral Daily  . atorvastatin  10 mg Oral Daily  . Fluticasone-Umeclidin-Vilant  1 puff Inhalation Daily  . furosemide  80 mg Intravenous Once  . insulin aspart  0-9 Units Subcutaneous TID WC  . ipratropium-albuterol  3 mL Inhalation TID  . Iron  1 tablet Oral Daily  . montelukast  10 mg Oral QHS  . predniSONE  20 mg Oral Q breakfast  . sodium chloride flush  3 mL Intravenous Q12H  . vitamin B-12  500 mcg Oral Daily    Antimicrobials: Anti-infectives (From admission, onward)   None      PRN meds: sodium chloride, acetaminophen, albuterol, ondansetron (ZOFRAN) IV, sodium chloride flush   Objective: Vitals:   09/28/19 0630 09/28/19 0650  BP:    Pulse: 96 102  Resp: (!) 26 22  Temp:    SpO2: 100% 96%    Intake/Output Summary (Last 24 hours) at 09/28/2019 0825 Last data filed at 09/28/2019 0459 Gross per 24 hour  Intake --  Output 1600 ml  Net -1600 ml   Filed Weights   09/27/19 1321  Weight: (!) 105.2 kg   Weight change:  Body mass index is 41.1 kg/m.   Physical Exam: General exam: Appears calm and comfortable.  Skin: No rashes, lesions or ulcers. HEENT: Atraumatic, normocephalic, supple neck, no obvious bleeding Lungs: Clear to auscultation bilaterally.  No wheezing or crackles heard CVS: Regular rate and rhythm,  no murmur GI/Abd soft, nontender, nondistended, bowel sound present CNS: Alert, awake, oriented x3 Psychiatry: Mood appropriate Extremities: Trace to 1+ bilateral pedal edema, with some chronic redness and soreness.  No calf tenderness  Data Review: I have personally reviewed the laboratory data and studies available.  Recent Labs  Lab 09/27/19 1325  WBC 11.7*  NEUTROABS 9.2*  HGB 8.5*  HCT 28.1*  MCV 80.3  PLT 307   Recent Labs  Lab 09/27/19 1325 09/28/19 0121 09/28/19 0509  NA 130*  --  134*  K 3.6  --  3.8  CL 89*  --  89*  CO2 29  --  32  GLUCOSE 185*  --  228*  BUN 18  --  18  CREATININE 0.85  --  0.88  CALCIUM 8.7*  --  9.0  MG  --  1.9  --    Lab Results  Component Value Date   HGBA1C 8.4 (H) 02/24/2019     Signed, Terrilee Croak, MD [facility] Pager: [phone] (Secure Chat preferred). 09/28/2019

## 2019-09-28 NOTE — ED Notes (Signed)
Pt given lunch meal tray. 

## 2019-09-29 DIAGNOSIS — I5031 Acute diastolic (congestive) heart failure: Secondary | ICD-10-CM | POA: Diagnosis not present

## 2019-09-29 LAB — ECHOCARDIOGRAM COMPLETE
AR max vel: 0.98 cm2
AV Area VTI: 0.94 cm2
AV Area mean vel: 0.95 cm2
AV Mean grad: 17.3 mmHg
AV Peak grad: 31.4 mmHg
Ao pk vel: 2.8 m/s
Area-P 1/2: 3.46 cm2
Height: 63 in
S' Lateral: 3.36 cm
Weight: 3712 oz

## 2019-09-29 LAB — BASIC METABOLIC PANEL
Anion gap: 7 (ref 5–15)
BUN: 21 mg/dL (ref 8–23)
CO2: 38 mmol/L — ABNORMAL HIGH (ref 22–32)
Calcium: 8.8 mg/dL — ABNORMAL LOW (ref 8.9–10.3)
Chloride: 90 mmol/L — ABNORMAL LOW (ref 98–111)
Creatinine, Ser: 1.01 mg/dL — ABNORMAL HIGH (ref 0.44–1.00)
GFR calc Af Amer: >60 mL/min (ref >60)
GFR calc non Af Amer: 54 mL/min — ABNORMAL LOW (ref >60)
Glucose, Bld: 333 mg/dL — ABNORMAL HIGH (ref 70–99)
Potassium: 4 mmol/L (ref 3.5–5.1)
Sodium: 135 mmol/L (ref 135–145)

## 2019-09-29 LAB — GLUCOSE, CAPILLARY
Glucose-Capillary: 343 mg/dL — ABNORMAL HIGH (ref 70–99)
Glucose-Capillary: 419 mg/dL — ABNORMAL HIGH (ref 70–99)
Glucose-Capillary: 426 mg/dL — ABNORMAL HIGH (ref 70–99)
Glucose-Capillary: 354 mg/dL — ABNORMAL HIGH (ref 70–99)

## 2019-09-29 LAB — GLUCOSE, RANDOM: Glucose, Bld: 469 mg/dL — ABNORMAL HIGH (ref 70–99)

## 2019-09-29 MED ORDER — HYDROCOD POLST-CPM POLST ER 10-8 MG/5ML PO SUER
5.0000 mL | Freq: Two times a day (BID) | ORAL | Status: DC | PRN
  Administered 2019-09-29 – 2019-10-01 (×3): 5 mL via ORAL
  Filled 2019-09-29 (×3): qty 5

## 2019-09-29 MED ORDER — INSULIN ASPART 100 UNIT/ML ~~LOC~~ SOLN
4.0000 [IU] | Freq: Three times a day (TID) | SUBCUTANEOUS | Status: DC
  Administered 2019-09-29 – 2019-09-30 (×4): 4 [IU] via SUBCUTANEOUS
  Filled 2019-09-29 (×3): qty 1

## 2019-09-29 MED ORDER — INSULIN GLARGINE 100 UNIT/ML ~~LOC~~ SOLN
35.0000 [IU] | Freq: Every day | SUBCUTANEOUS | Status: DC
  Administered 2019-09-29 – 2019-09-30 (×2): 35 [IU] via SUBCUTANEOUS
  Filled 2019-09-29 (×3): qty 0.35

## 2019-09-29 NOTE — Progress Notes (Signed)
Inpatient Diabetes Program Recommendations  AACE/ADA: New Consensus Statement on Inpatient Glycemic Control   Target Ranges:  Prepandial:   less than 140 mg/dL      Peak postprandial:   less than 180 mg/dL (1-2 hours)      Critically ill patients:  140 - 180 mg/dL   Results for Karen [name], Karen [name] (MRN [id number]) as of 09/29/2019 09:21  Ref. Range 09/28/2019 05:05 09/28/2019 09:13 09/28/2019 13:04 09/28/2019 19:31 09/28/2019 22:40 09/29/2019 08:22  Glucose-Capillary Latest Ref Range: 70 - 99 mg/dL 218 (H) 192 (H) 341 (H) 414 (H) 392 (H) 343 (H)   Review of Glycemic Control  Diabetes history: DM2 Outpatient Diabetes medications: V-Go 40 insulin pump with Humalog (takes 0,7,6 clicks with small, medium, or large meal; plus 1-2 clicks for hyperglycemia), Metformin 1000 mg BID, Invokana 100 mg daily  Current orders for Inpatient glycemic control: Novolog 0-9 units TID with meals  Inpatient Diabetes Program Recommendations:   Insulin-Basal: Please consider ordering Lantus 35 units Q24H starting now.  Insulin-Meal Coverage: Please consider ordering Novolog 4 units TID with meals for meal coverage if patient eats at least 50% of meals.  NOTE: Patient sees Dr. Honor Junes and Renae Gloss, NP for DM management. Per office note on 08/29/19 by Renae Gloss, NP patient is prescribed: Metformin 1000 mg twicedaily, and Invokana 100 mg daily, V-Go 40 plus 2,2,6 clicks for small, medium, large meal-take extra click if blood sugar over 170 before your meals,take 6 clicks if blood sugar is over 250. Spoke with patient over the phone. Patient states that she removed her pump on 09/28/19 as instructed by hospital staff. Patient confirms that she is taking DM medications as noted above. Informed patient that it would be requested that MD order basal insulin since she is not wearing her insulin pump (as pump delivers 40 units of insulin for basal needs over 24 hours). Patient verbalized understanding of information  discussed and states that she has no questions at this time.  Thanks, Barnie Alderman, RN, MSN, CDE Diabetes Coordinator Inpatient Diabetes Program [phone] (Team Pager from 8am to 5pm)

## 2019-09-29 NOTE — Progress Notes (Signed)
PT Cancellation Note  Patient Details Name: Karen [name] MRN: [id number] DOB: 27-Nov-1945   Cancelled Treatment:    Reason Eval/Treat Not Completed: Medical issues which prohibited therapy (Per chart review, pt has elevated blood glucose. Pt currently contraindicated for activity.)  Luv Mish SPT 09/29/19, 1:12 PM

## 2019-09-29 NOTE — Progress Notes (Signed)
PROGRESS NOTE    Karen [name]  HKV:[id number] DOB: [birth year] DOA: 09/27/2019 PCP: Ricardo Jericho, NP    Assessment & Plan:   Principal Problem:   Acute diastolic CHF (congestive heart failure) ([place]) Active Problems:   Diabetes mellitus type 2, uncomplicated (HCC)   Hypertension   Iron deficiency anemia   Obesity (BMI 35.0-39.9 without comorbidity)   Chronic respiratory failure with hypoxia (HCC)   (HFpEF) heart failure with preserved ejection fraction (HCC)   COPD with acute exacerbation (HCC)    AIESHA [name] is a 74 y.o. female with a pertinent history of COPD on 3 L of oxygen and inhalers, type 2 diabetes, hypertension, diastolic heart failure who presents to the ED with worsening shortness of breath with exertion and LE edema.   heart failure exacerbation  --CXR showed "pulmonary vascular congestion and probable minimal pulmonary edema." PLAN: --IV lasix 40 mg BID --Strict I/O -TTE   COPD, exacerbation ruled out Chronic hypoxic respiratory failure on 3 L --continue home daily bronchodilators  Hyponatremia 130 --likely due to hypervolemia, improved with diuresis --Monitor Na --continue diuresis  Anemia, suspect component of iron deficiency, history of B12 as well  Atrial fibrillation on Eliquis distant history of a DVT --continue home Eliquis  HTN   T2DM  --on home insulin pump, which was removed --Start Lantus 35u daily and meal-time 4u TID, per rec of diabetic educator --SSI TID   DVT prophylaxis: FI:EPPIRJJ Code Status: DNR  Family Communication:  Status is: inpatient Dispo:   The patient is from: home Anticipated d/c is to: home Anticipated d/c date is: 2-3 days Patient currently is not medically stable to d/c due to: on IV diuretic for CHF exacerbation   Subjective and Interval History:  Pt reported feeling a little better.  Had good urine output with IV lasix.  Swelling went down.  Complained of back pain which is  chronic.   Objective: Vitals:   09/29/19 1140 09/29/19 1337 09/29/19 1632 09/29/19 2023  BP: 105/74  (!) 141/86 127/77  Pulse: 94  98 80  Resp: 20  18   Temp: 98.1 F (36.7 C)  97.8 F (36.6 C) 97.9 F (36.6 C)  TempSrc: Oral  Oral Oral  SpO2: 94% 94% 95% 95%  Weight:      Height:        Intake/Output Summary (Last 24 hours) at 09/29/2019 2056 Last data filed at 09/29/2019 2023 Gross per 24 hour  Intake --  Output 3051 ml  Net -3051 ml   Filed Weights   09/27/19 1321  Weight: (!) 105.2 kg    Examination:   Constitutional: NAD, AAOx3 HEENT: conjunctivae and lids normal, EOMI CV: RRR no M,R,G. Distal pulses +2.  No cyanosis.   RESP: CTA B/L, normal respiratory effort, on 3L (baseline) GI: +BS, NTND Extremities: tense swelling in BLE SKIN: warm, dry and intact Neuro: II - XII grossly intact.  Sensation intact Psych: Normal mood and affect.  Appropriate judgement and reason   Data Reviewed: I have personally reviewed following labs and imaging studies  CBC: Recent Labs  Lab 09/27/19 1325  WBC 11.7*  NEUTROABS 9.2*  HGB 8.5*  HCT 28.1*  MCV 80.3  PLT 884   Basic Metabolic Panel: Recent Labs  Lab 09/27/19 1325 09/28/19 0121 09/28/19 0509 09/28/19 2012 09/29/19 0513 09/29/19 1153  NA 130*  --  134*  --  135  --   K 3.6  --  3.8  --  4.0  --  CL 89*  --  89*  --  90*  --   CO2 29  --  32  --  38*  --   GLUCOSE 185*  --  228* 445* 333* 469*  BUN 18  --  18  --  21  --   CREATININE 0.85  --  0.88  --  1.01*  --   CALCIUM 8.7*  --  9.0  --  8.8*  --   MG  --  1.9  --   --   --   --    GFR: Estimated Creatinine Clearance: 55.8 mL/min (A) (by C-G formula based on SCr of 1.01 mg/dL (H)). Liver Function Tests: Recent Labs  Lab 09/27/19 1325  AST 14*  ALT 11  ALKPHOS 68  BILITOT 0.6  PROT 6.8  ALBUMIN 3.6   No results for input(s): LIPASE, AMYLASE in the last 168 hours. No results for input(s): AMMONIA in the last 168 hours. Coagulation  Profile: No results for input(s): INR, PROTIME in the last 168 hours. Cardiac Enzymes: No results for input(s): CKTOTAL, CKMB, CKMBINDEX, TROPONINI in the last 168 hours. BNP (last 3 results) No results for input(s): PROBNP in the last 8760 hours. HbA1C: Recent Labs    09/28/19 1419  HGBA1C 8.8*   CBG: Recent Labs  Lab 09/28/19 2240 09/29/19 0822 09/29/19 1137 09/29/19 1632 09/29/19 2024  GLUCAP 392* 343* 419* 426* 354*   Lipid Profile: No results for input(s): CHOL, HDL, LDLCALC, TRIG, CHOLHDL, LDLDIRECT in the last 72 hours. Thyroid Function Tests: No results for input(s): TSH, T4TOTAL, FREET4, T3FREE, THYROIDAB in the last 72 hours. Anemia Panel: No results for input(s): VITAMINB12, FOLATE, FERRITIN, TIBC, IRON, RETICCTPCT in the last 72 hours. Sepsis Labs: No results for input(s): PROCALCITON, LATICACIDVEN in the last 168 hours.  Recent Results (from the past 240 hour(s))  SARS Coronavirus 2 by RT PCR (hospital order, performed in [facility] hospital lab) Nasopharyngeal Nasopharyngeal Swab     Status: None   Collection Time: 09/28/19  1:17 AM   Specimen: Nasopharyngeal Swab  Result Value Ref Range Status   SARS Coronavirus 2 NEGATIVE NEGATIVE Final    Comment: (NOTE) SARS-CoV-2 target nucleic acids are NOT DETECTED.  The SARS-CoV-2 RNA is generally detectable in upper and lower respiratory specimens during the acute phase of infection. The lowest concentration of SARS-CoV-2 viral copies this assay can detect is 250 copies / mL. A negative result does not preclude SARS-CoV-2 infection and should not be used as the sole basis for treatment or other patient management decisions.  A negative result may occur with improper specimen collection / handling, submission of specimen other than nasopharyngeal swab, presence of viral mutation(s) within the areas targeted by this assay, and inadequate number of viral copies (<250 copies / mL). A negative result must be  combined with clinical observations, patient history, and epidemiological information.  Fact Sheet for Patients:   StrictlyIdeas.no  Fact Sheet for Healthcare Providers: BankingDealers.co.za  This test is not yet approved or  cleared by the [place] FDA and has been authorized for detection and/or diagnosis of SARS-CoV-2 by FDA under an Emergency Use Authorization (EUA).  This EUA will remain in effect (meaning this test can be used) for the duration of the COVID-19 declaration under Section 564(b)(1) of the Act, 21 U.S.C. section 360bbb-3(b)(1), unless the authorization is terminated or revoked sooner.  Performed at [facility], [address], [place], [place] [address]  Radiology Studies: ECHOCARDIOGRAM COMPLETE  Result Date: 09/29/2019    ECHOCARDIOGRAM REPORT   Patient Name:   TYLESHA GIBEAULT Date of Exam: 09/28/2019 Medical Rec #:  [id number]        Height:       63.0 in Accession #:    [id number]       Weight:       232.0 lb Date of Birth:  Jul 24, 1945       BSA:          2.060 m Patient Age:    12 years         BP:           139/78 mmHg Patient Gender: F                HR:           102 bpm. Exam Location:  [facility] Procedure: 2D Echo, Cardiac Doppler and Color Doppler Indications:     CHF-Acute Diastolic 754.49 / E01.00  History:         Patient has prior history of Echocardiogram examinations. CHF,                  COPD; Risk Factors:Hypertension.  Sonographer:     Alyse Low Roar Referring Phys:  [id number] Sueanne Margarita Diagnosing Phys: Kathlyn Sacramento MD IMPRESSIONS  1. Left ventricular ejection fraction, by estimation, is 55 to 60%. The left ventricle has normal function. The left ventricle has no regional wall motion abnormalities. There is mild left ventricular hypertrophy. Left ventricular diastolic parameters are consistent with Grade I diastolic dysfunction (impaired relaxation).  2. Right ventricular systolic  function is normal. The right ventricular size is normal. There is severely elevated pulmonary artery systolic pressure. The estimated right ventricular systolic pressure is 88.3 mmHg.  3. Left atrial size was mildly dilated.  4. The mitral valve is normal in structure. Trivial mitral valve regurgitation. No evidence of mitral stenosis.  5. The aortic valve is abnormal. Aortic valve regurgitation is not visualized. Moderate aortic valve stenosis. Aortic valve area, by VTI measures 0.94 cm. Aortic valve mean gradient measures 17.3 mmHg. FINDINGS  Left Ventricle: Left ventricular ejection fraction, by estimation, is 55 to 60%. The left ventricle has normal function. The left ventricle has no regional wall motion abnormalities. The left ventricular internal cavity size was normal in size. There is  mild left ventricular hypertrophy. Left ventricular diastolic parameters are consistent with Grade I diastolic dysfunction (impaired relaxation). Right Ventricle: The right ventricular size is normal. No increase in right ventricular wall thickness. Right ventricular systolic function is normal. There is severely elevated pulmonary artery systolic pressure. The tricuspid regurgitant velocity is 3.66 m/s, and with an assumed right atrial pressure of 10 mmHg, the estimated right ventricular systolic pressure is 25.4 mmHg. Left Atrium: Left atrial size was mildly dilated. Right Atrium: Right atrial size was normal in size. Pericardium: There is no evidence of pericardial effusion. Mitral Valve: The mitral valve is normal in structure. Normal mobility of the mitral valve leaflets. Trivial mitral valve regurgitation. No evidence of mitral valve stenosis. Tricuspid Valve: The tricuspid valve is normal in structure. Tricuspid valve regurgitation is mild . No evidence of tricuspid stenosis. Aortic Valve: The aortic valve is abnormal. Aortic valve regurgitation is not visualized. Moderate aortic stenosis is present. Aortic valve  mean gradient measures 17.3 mmHg. Aortic valve peak gradient measures 31.4 mmHg. Aortic valve area, by VTI measures 0.94 cm. Pulmonic Valve: The pulmonic valve was normal in  structure. Pulmonic valve regurgitation is not visualized. No evidence of pulmonic stenosis. Aorta: The aortic root is normal in size and structure. Venous: The inferior vena cava was not well visualized. IAS/Shunts: No atrial level shunt detected by color flow Doppler.  LEFT VENTRICLE PLAX 2D LVIDd:         4.65 cm  Diastology LVIDs:         3.36 cm  LV e' lateral:   10.90 cm/s LV PW:         1.19 cm  LV E/e' lateral: 11.0 LV IVS:        1.40 cm  LV e' medial:    5.66 cm/s LVOT diam:     1.70 cm  LV E/e' medial:  21.2 LV SV:         51 LV SV Index:   25 LVOT Area:     2.27 cm  RIGHT VENTRICLE RV Mid diam:    2.71 cm RV S prime:     17.90 cm/s TAPSE (M-mode): 2.3 cm LEFT ATRIUM             Index       RIGHT ATRIUM           Index LA diam:        3.70 cm 1.80 cm/m  RA Area:     17.30 cm LA Vol (A2C):   66.0 ml 32.05 ml/m RA Volume:   41.00 ml  19.91 ml/m LA Vol (A4C):   65.9 ml 32.00 ml/m LA Biplane Vol: 68.8 ml 33.41 ml/m  AORTIC VALVE                    PULMONIC VALVE AV Area (Vmax):    0.98 cm     PV Vmax:       1.40 m/s AV Area (Vmean):   0.95 cm     PV Peak grad:  7.8 mmHg AV Area (VTI):     0.94 cm AV Vmax:           280.00 cm/s AV Vmean:          194.667 cm/s AV VTI:            0.542 m AV Peak Grad:      31.4 mmHg AV Mean Grad:      17.3 mmHg LVOT Vmax:         121.00 cm/s LVOT Vmean:        81.100 cm/s LVOT VTI:          0.225 m LVOT/AV VTI ratio: 0.42  AORTA Ao Root diam: 2.90 cm MITRAL VALVE                TRICUSPID VALVE MV Area (PHT): 3.46 cm     TR Peak grad:   53.6 mmHg MV Decel Time: 219 msec     TR Vmax:        366.00 cm/s MV E velocity: 120.00 cm/s MV A velocity: 162.00 cm/s  SHUNTS MV E/A ratio:  0.74         Systemic VTI:  0.22 m MV A Prime:    15.4 cm/s    Systemic Diam: 1.70 cm Kathlyn Sacramento MD Electronically  signed by Kathlyn Sacramento MD Signature Date/Time: 09/29/2019/9:19:29 AM    Final      Scheduled Meds: . apixaban  5 mg Oral BID  . aspirin EC  81 mg Oral Daily  . atorvastatin  10 mg Oral Daily  .  ferrous sulfate  325 mg Oral Daily  . fluticasone  2 spray Each Nare Daily  . fluticasone furoate-vilanterol  1 puff Inhalation Daily  . furosemide  40 mg Intravenous BID  . insulin aspart  0-9 Units Subcutaneous TID WC  . insulin aspart  4 Units Subcutaneous TID WC  . insulin glargine  35 Units Subcutaneous Daily  . ipratropium-albuterol  3 mL Inhalation TID  . mouth rinse  15 mL Mouth Rinse BID  . montelukast  10 mg Oral QHS  . pantoprazole  40 mg Oral Daily  . sodium chloride flush  3 mL Intravenous Q12H  . umeclidinium bromide  1 puff Inhalation Daily  . vitamin B-12  500 mcg Oral Daily   Continuous Infusions: . sodium chloride       LOS: 2 days     Enzo Bi, MD [facility] If 7PM-7AM, please contact night-coverage 09/29/2019, 8:56 PM

## 2019-09-30 ENCOUNTER — Inpatient Hospital Stay: Payer: Medicare Other

## 2019-09-30 DIAGNOSIS — I5031 Acute diastolic (congestive) heart failure: Secondary | ICD-10-CM | POA: Diagnosis not present

## 2019-09-30 LAB — GLUCOSE, CAPILLARY
Glucose-Capillary: 372 mg/dL — ABNORMAL HIGH (ref 70–99)
Glucose-Capillary: 274 mg/dL — ABNORMAL HIGH (ref 70–99)
Glucose-Capillary: 291 mg/dL — ABNORMAL HIGH (ref 70–99)
Glucose-Capillary: 363 mg/dL — ABNORMAL HIGH (ref 70–99)

## 2019-09-30 LAB — BASIC METABOLIC PANEL
Anion gap: 9 (ref 5–15)
BUN: 21 mg/dL (ref 8–23)
CO2: 34 mmol/L — ABNORMAL HIGH (ref 22–32)
Calcium: 9 mg/dL (ref 8.9–10.3)
Chloride: 90 mmol/L — ABNORMAL LOW (ref 98–111)
Creatinine, Ser: 0.88 mg/dL (ref 0.44–1.00)
GFR calc Af Amer: >60 mL/min (ref >60)
GFR calc non Af Amer: >60 mL/min (ref >60)
Glucose, Bld: 381 mg/dL — ABNORMAL HIGH (ref 70–99)
Potassium: 4.3 mmol/L (ref 3.5–5.1)
Sodium: 133 mmol/L — ABNORMAL LOW (ref 135–145)

## 2019-09-30 LAB — MAGNESIUM: Magnesium: 1.8 mg/dL (ref 1.7–2.4)

## 2019-09-30 LAB — CBC
HCT: 29.8 % — ABNORMAL LOW (ref 36.0–46.0)
Hemoglobin: 9 g/dL — ABNORMAL LOW (ref 12.0–15.0)
MCH: 24.5 pg — ABNORMAL LOW (ref 26.0–34.0)
MCHC: 30.2 g/dL (ref 30.0–36.0)
MCV: 81 fL (ref 80.0–100.0)
Platelets: 315 10*3/uL (ref 150–400)
RBC: 3.68 MIL/uL — ABNORMAL LOW (ref 3.87–5.11)
RDW: 16.7 % — ABNORMAL HIGH (ref 11.5–15.5)
WBC: 9.9 10*3/uL (ref 4.0–10.5)
nRBC: 0 % (ref 0.0–0.2)

## 2019-09-30 MED ORDER — INSULIN ASPART 100 UNIT/ML ~~LOC~~ SOLN
10.0000 [IU] | Freq: Three times a day (TID) | SUBCUTANEOUS | Status: DC
  Administered 2019-09-30 – 2019-10-01 (×3): 10 [IU] via SUBCUTANEOUS
  Filled 2019-09-30 (×3): qty 1

## 2019-09-30 MED ORDER — FUROSEMIDE 10 MG/ML IJ SOLN
40.0000 mg | Freq: Two times a day (BID) | INTRAMUSCULAR | Status: DC
  Administered 2019-09-30 – 2019-10-02 (×4): 40 mg via INTRAVENOUS
  Filled 2019-09-30 (×4): qty 4

## 2019-09-30 MED ORDER — ALUM & MAG HYDROXIDE-SIMETH 200-200-20 MG/5ML PO SUSP: 15.0000 mL | Freq: Four times a day (QID) | ORAL | Status: DC | PRN

## 2019-09-30 MED ORDER — GUAIFENESIN-DM 100-10 MG/5ML PO SYRP: 10.0000 mL | ORAL_SOLUTION | Freq: Four times a day (QID) | ORAL | Status: DC | PRN

## 2019-09-30 MED ORDER — DOCUSATE SODIUM 100 MG PO CAPS: 100.0000 mg | ORAL_CAPSULE | Freq: Two times a day (BID) | ORAL | Status: DC | PRN

## 2019-09-30 MED ORDER — ACETAMINOPHEN 500 MG PO TABS: 1000.0000 mg | ORAL_TABLET | Freq: Three times a day (TID) | ORAL | Status: DC | PRN

## 2019-09-30 MED ORDER — CALCIUM CARBONATE ANTACID 500 MG PO CHEW: 1.0000 | CHEWABLE_TABLET | Freq: Three times a day (TID) | ORAL | Status: DC | PRN

## 2019-09-30 MED ORDER — POLYETHYLENE GLYCOL 3350 17 G PO PACK: 17.0000 g | PACK | Freq: Two times a day (BID) | ORAL | Status: DC | PRN

## 2019-09-30 MED ORDER — ONDANSETRON 4 MG PO TBDP
4.0000 mg | ORAL_TABLET | Freq: Three times a day (TID) | ORAL | Status: DC | PRN
  Filled 2019-09-30: qty 1

## 2019-09-30 NOTE — TOC Initial Note (Signed)
Transition of Care [facility]) - Initial/Assessment Note    Patient Details  Name: Karen [name] MRN: [id number] Date of Birth: 10-May-1945  Transition of [facility]) CM/SW Contact:    Shelbie Ammons, RN Phone Number: 09/30/2019, 11:25 AM  Clinical Narrative:   RNCM assessed patient at bedside, patient reports to having some abdominal pain right now. RNCM discussed reason for visit. Patient reports that she lives at home with her husband and her son, her husband is her caregiver and drives her to appointments. Patient reports that nothing has changed with her living situation, she still uses her equipment and has her oxygen although she can't remember who it is through. She reports that she weighs daily and understands what to do if she gains weight. She reports that she follows up with the HF clinic and also gets visits from the [employer]. Denies any other needs at this time.                 Expected Discharge Plan: Home/Self Care Barriers to Discharge: Continued Medical Work up   Patient Goals and CMS Choice        Expected Discharge Plan and Services Expected Discharge Plan: Home/Self Care   Discharge Planning Services: CM Consult   Living arrangements for the past 2 months: Single Family Home                                      Prior Living Arrangements/Services Living arrangements for the past 2 months: Single Family Home Lives with:: Spouse, Adult Children Patient language and need for interpreter reviewed:: Yes        Need for Family Participation in Patient Care: Yes (Comment) Care giver support system in place?: Yes (comment)   Criminal Activity/Legal Involvement Pertinent to Current Situation/Hospitalization: No - Comment as needed  Activities of Daily Living Home Assistive Devices/Equipment: Walker (specify type), Cane (specify quad or straight), Oxygen ADL Screening (condition at time of admission) Patient's cognitive ability adequate to safely  complete daily activities?: Yes Is the patient deaf or have difficulty hearing?: No Does the patient have difficulty seeing, even when wearing glasses/contacts?: No Does the patient have difficulty concentrating, remembering, or making decisions?: No Patient able to express need for assistance with ADLs?: Yes Does the patient have difficulty dressing or bathing?: No Independently performs ADLs?: Yes (appropriate for developmental age) Does the patient have difficulty walking or climbing stairs?: No Weakness of Legs: None Weakness of Arms/Hands: None  Permission Sought/Granted                  Emotional Assessment Appearance:: Appears stated age Attitude/Demeanor/Rapport: Complaining, Guarded Affect (typically observed): Restless Orientation: : Oriented to Self, Oriented to Place, Oriented to  Time, Oriented to Situation Alcohol / Substance Use: Not Applicable Psych Involvement: No (comment)  Admission diagnosis:  (HFpEF) heart failure with preserved ejection fraction (HCC) [I50.30] Acute on chronic congestive heart failure, unspecified heart failure type [facility]) [I50.9] Patient Active Problem List   Diagnosis Date Noted  . COPD with acute exacerbation ([place]) 09/28/2019  . Acute diastolic CHF (congestive heart failure) ([place]) 09/28/2019  . (HFpEF) heart failure with preserved ejection fraction ([place]) 09/27/2019  . Rectal bleeding 06/28/2019  . Hypokalemia 06/28/2019  . Hyponatremia 06/28/2019  . Type II diabetes mellitus with renal manifestations ([place]) 06/28/2019  . CKD (chronic kidney disease), stage IIIa 06/28/2019  . Atrial fibrillation, chronic ([place]) 06/28/2019  .  Pulmonary edema 02/27/2019  . Bradycardia 02/24/2019  . Chronic respiratory failure with hypoxia ([place]) 02/23/2019  . Atypical chest pain 02/13/2019  . AVM (arteriovenous malformation) of small bowel, acquired   . Acute gastric ulcer with hemorrhage   . Chronic diastolic heart failure ([place]) 08/22/2018  . Diarrhea  08/22/2018  . Junctional bradycardia   . Acute on chronic heart failure with preserved ejection fraction (HFpEF) ([place])   . AKI (acute kidney injury) ([place])   . Symptomatic bradycardia 08/05/2018  . Acute on chronic respiratory failure ([place]) 06/25/2018  . 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 diabetes mellitus ([place]) 01/25/2018  . History of non anemic vitamin B12 deficiency 01/25/2018  . Personal history of kidney stones 11/12/2017  . Urge incontinence 11/12/2017  . Right ureteral stone 09/15/2017  . Acute GI bleeding   . GI bleed 08/26/2017  . Arthritis 08/10/2017  . Chronic kidney disease 08/10/2017  . COPD (chronic obstructive pulmonary disease) ([place]) 08/10/2017  . Diabetes mellitus type 2, uncomplicated ([place]) 84/53/6468  . Hypertension 08/10/2017  . Obesity (BMI 35.0-39.9 without comorbidity) 04/11/2017  . Primary osteoarthritis of right knee 09/01/2016  . Leucocytosis 10/19/2015  . 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 Acute on chronic respiratory failure with hypoxia and hypercapnia ([place]) 01/07/2015  . Sciatica 01/07/2015  . Weakness 01/07/2015  . 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 Iron deficiency anemia 06/22/2014  . Microalbuminuria 06/22/2014  . CHF (congestive heart failure) ([place]) 04/20/2014  . Edema, peripheral 04/20/2014   PCP:  Ricardo Jericho, NP Pharmacy:   [facility] [address] [place] [address] Phone: [phone] Fax: [phone]     Social Determinants of Health (SDOH) Interventions    Readmission Risk Interventions Readmission Risk Prevention Plan 09/30/2019 02/26/2019 09/17/2018  Transportation Screening Complete Complete Complete  PCP or Specialist Appt within 5-7 Days - - -  PCP or Specialist Appt within 3-5 Days Complete - -  Home Care Screening - - -  Medication Review (RN CM) - - -  Social Work Consult for [place] Planning/Counseling  Complete Complete -  Palliative Care Screening Not Applicable Not Applicable -  Medication Review Press photographer) Complete Referral to Pharmacy Complete  PCP or Specialist appointment within 3-5 days of discharge - - Not Complete  PCP/Specialist Appt Not Complete comments - - PCP appointment 7/20  [place] or Home Care Consult - - Complete  [place] - - Not Applicable  Some recent data might be hidden

## 2019-09-30 NOTE — Evaluation (Signed)
Physical Therapy Evaluation Patient Details Name: Karen [name] MRN: [id number] DOB: Oct 07, 1945 Today's Date: 09/30/2019   History of Present Illness  Karen [name] is a 74 y.o. 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     Clinical Impression  Pt received supine in bed upon initial encounter.  Pt was able to give history and perform some exercises prior to lunch being delivered in which pt requested therapist to come back.  Upon second encounter with pt, pt was able to perform adequate bed mobility transfers with use of UE and bed railing to com into seated position.  Pt reports that hospital socks break her feet out, so her personal shoes were donned before attempting to stand.  Pt was able to utilize UE and modA for transfer to standing position.  Pt was given verbal cuing for hand placement but refused to utilize technique and instead attempted to rise with use of the walker.  Therapist stabilized walker for safe transfer to standing.  Pt was able to perform standing marches and weight shifts while in standing with no difficulty.  Pt refused to walk, and purewick was dislodged while in standing.  Pt then transferred back to supine position and was able to do transfers with minA and nursing notified of new purewick needed.  Pt will benefit from PT services in a HHPT setting upon discharge to safely address deficits listed in patient problem list for decreased caregiver assistance and eventual return to PLOF.     Follow Up Recommendations Home health PT    Equipment Recommendations  None recommended by PT;Other (comment) (Pt already has walker at home and other items notated in home section.)    Recommendations for Other Services        Precautions / Restrictions Precautions Precautions: None Restrictions Weight Bearing Restrictions: No      Mobility  Bed Mobility Overal bed mobility: Modified Independent             General bed mobility comments: Able to perform bed mobility with use of handrails  Transfers Overall transfer level: Needs assistance Equipment used: Rolling walker (2 wheeled) Transfers: Sit to/from Stand Sit to Stand: Mod assist         General transfer comment: Walker required stabilization from therapist in order to prevent from tipping over.  Pt given verbal instructions, but would not follow them when performing transfer.  Ambulation/Gait                Stairs            Wheelchair Mobility    Modified Rankin (Stroke Patients Only)       Balance Overall balance assessment: Needs assistance Sitting-balance support: Feet supported Sitting balance-Leahy Scale: Good Sitting balance - Comments: Pt able to sit with B LE supported on the floor.   Standing balance support: Bilateral upper extremity supported Standing balance-Leahy Scale: Good Standing balance comment: Pt was able to perform weight shifts while in standing, and able to march in place as well.                             Pertinent Vitals/Pain Pain Assessment: No/denies pain    Home Living Family/patient expects to be discharged to:: Private  residence   Available Help at Discharge: Family;Available 24 hours/day Type of Home: Mobile home Home Access: Ramped entrance     Home Layout: One level Home Equipment: [place] - 2 wheels;Cane - single point;Grab bars - tub/shower;Walker - 4 wheels      Prior Function Level of Independence: Independent with assistive device(s)         Comments: ambulating with RW, pt denies falls in last 6 months, however reports she has fallen out of bed, but does not count that, husband does cooking and housework.     Hand Dominance   Dominant Hand:  Right    Extremity/Trunk Assessment        Lower Extremity Assessment Lower Extremity Assessment: Generalized weakness;Overall [facility] for tasks assessed    Cervical / Trunk Assessment Cervical / Trunk Assessment: Kyphotic  Communication   Communication: No difficulties  Cognition Arousal/Alertness: Awake/alert Behavior During Therapy: WFL for tasks assessed/performed Overall Cognitive Status: Within Functional Limits for tasks assessed                                        General Comments      Exercises Total Joint Exercises Ankle Circles/Pumps: AROM;Both;10 reps Hip ABduction/ADduction: AROM;Both;10 reps Straight Leg Raises: AROM;Both;10 reps Long Arc Quad: AROM;Both;10 reps Marching in Standing: AROM;Both;10 reps   Assessment/Plan    PT Assessment Patient needs continued PT services  PT Problem List Decreased strength;Decreased mobility;Decreased safety awareness;Decreased range of motion;Obesity;Decreased activity tolerance;Decreased balance;Decreased knowledge of use of DME       PT Treatment Interventions DME instruction;Therapeutic exercise;Gait training;Balance training;Functional mobility training;Therapeutic activities;Patient/family education    PT Goals (Current goals can be found in the Care Plan section)  Acute Rehab PT Goals Patient Stated Goal: To get stronger PT Goal Formulation: With patient Time For Goal Achievement: 10/14/19 Potential to Achieve Goals: Fair    Frequency Min 2X/week   Barriers to discharge        Co-evaluation               AM-PAC PT "6 Clicks" Mobility  Outcome Measure Help needed turning from your back to your side while in a flat bed without using bedrails?: None Help needed moving from lying on your back to sitting on the side of a flat bed without using bedrails?: None Help needed moving to and from a bed to a chair (including a wheelchair)?: A Little Help needed standing up from a chair using  your arms (e.g., wheelchair or bedside chair)?: A Little Help needed to walk in hospital room?: A Lot Help needed climbing 3-5 steps with a railing? : Total 6 Click Score: 17    End of Session Equipment Utilized During Treatment: Gait belt Activity Tolerance: Patient tolerated treatment well;Patient limited by fatigue Patient left: in bed;with call bell/phone within reach;with bed alarm set;Other (comment) (with MD in room.) Nurse Communication: Mobility status PT Visit Diagnosis: Unsteadiness on feet (R26.81);Other abnormalities of gait and mobility (R26.89);Muscle weakness (generalized) (M62.81);History of falling (Z91.81);Difficulty in walking, not elsewhere classified (R26.2)    Time: 1350 (Inital session: 13:50-13:58, Second session to complete eval: 14:34-14:55)-1455 PT Time Calculation (min) (ACUTE ONLY): 65 min   Charges:   PT Evaluation $PT Eval Low Complexity: 1 Low PT Treatments $Therapeutic Exercise: 23-37 mins        Gwenlyn Saran, PT, DPT 09/30/19, 3:37 PM

## 2019-09-30 NOTE — Care Management Important Message (Signed)
Important Message  Patient Details  Name: Karen [name] MRN: [id number] Date of Birth: 10/24/1945   Medicare Important Message Given:  N/A - LOS <3 / Initial given by admissions     Juliann Pulse A Guido Comp 09/30/2019, 7:11 AM

## 2019-09-30 NOTE — Progress Notes (Signed)
Inpatient Diabetes Program Recommendations  AACE/ADA: New Consensus Statement on Inpatient Glycemic Control   Target Ranges:  Prepandial:   less than 140 mg/dL      Peak postprandial:   less than 180 mg/dL (1-2 hours)      Critically ill patients:  140 - 180 mg/dL   Results for [name], [name] (MRN [id number]) as of 09/30/2019 10:38  Ref. Range 09/29/2019 08:22 09/29/2019 11:37 09/29/2019 16:32 09/29/2019 20:24 09/30/2019 07:43  Glucose-Capillary Latest Ref Range: 70 - 99 mg/dL 343 (H) 419 (H) 426 (H) 354 (H) 372 (H)   Review of Glycemic Control  Diabetes history: DM2 Outpatient Diabetes medications: V-Go 40 insulin pump with Humalog (takes 4,0,9 clicks with small, medium, or large meal; plus 1-2 clicks for hyperglycemia), Metformin 1000 mg BID, Invokana 100 mg daily  Current orders for Inpatient glycemic control: Lantus 35 units daily, Novolog 10 units TID with meals for meal coverage,  Novolog 0-9 units TID with meals  Inpatient Diabetes Program Recommendations:   Insulin-Basal: Please consider increasing Lantus to 45 units daily starting 10/01/19. If agreeable, please also order one time Lantus 10 units x1 now (since already received 35 units this morning).  Thanks, Barnie Alderman, RN, MSN, CDE Diabetes Coordinator Inpatient Diabetes Program [phone] (Team Pager from 8am to 5pm)

## 2019-09-30 NOTE — Progress Notes (Signed)
PROGRESS NOTE    Karen [name]  PJK:[id number] DOB: 30-Sep-1945 DOA: 09/27/2019 PCP: Ricardo Jericho, NP    Assessment & Plan:   Principal Problem:   Acute diastolic CHF (congestive heart failure) ([place]) Active Problems:   Diabetes mellitus type 2, uncomplicated (HCC)   Hypertension   Iron deficiency anemia   Obesity (BMI 35.0-39.9 without comorbidity)   Chronic respiratory failure with hypoxia (HCC)   (HFpEF) heart failure with preserved ejection fraction (HCC)   COPD with acute exacerbation (HCC)    Karen [name] is a 74 y.o. female with a pertinent history of COPD on 3 L of oxygen and inhalers, type 2 diabetes, hypertension, diastolic heart failure who presents to the ED with worsening shortness of breath with exertion and LE edema.   heart failure exacerbation  --CXR showed "pulmonary vascular congestion and probable minimal pulmonary edema." PLAN: --IV lasix 40 mg BID --Strict I/O -TTE    COPD, exacerbation ruled out Chronic hypoxic respiratory failure on 3 L  --continue home daily bronchodilators  Hyponatremia 130 --likely due to hypervolemia, improved with diuresis --Monitor Na --continue diuresis  Anemia, suspect component of iron deficiency, history of B12 as well  Atrial fibrillation on Eliquis distant history of a DVT --continue home Eliquis  HTN   T2DM  --on home insulin pump, which was removed --continue Lantus 35u daily  --increase meal-time to 10u TID --SSI TID   DVT prophylaxis: YK:DXIPJAS Code Status: DNR  Family Communication:  Status is: inpatient Dispo:   The patient is from: home Anticipated d/c is to: home Anticipated d/c date is: 2-3 days Patient currently is not medically stable to d/c due to: on IV diuretic for CHF exacerbation   Subjective and Interval History:  Nursing reported pt complained of RLQ abdominal pain, which later resolved.  Pt admitted to bloating feeling.  Good urine output with IV lasix with  improved Cr.  Swelling improved.   Objective: Vitals:   09/30/19 0814 09/30/19 1219 09/30/19 1340 09/30/19 1611  BP: (!) 161/122 (!) 156/71  (!) 139/65  Pulse: 93 82  93  Resp: 22 20  20   Temp: 98.5 F (36.9 C) 97.9 F (36.6 C)  98 F (36.7 C)  TempSrc: Oral Oral  Oral  SpO2: 91% 96% 96% 96%  Weight:      Height:        Intake/Output Summary (Last 24 hours) at 09/30/2019 1734 Last data filed at 09/30/2019 1150 Gross per 24 hour  Intake --  Output 3950 ml  Net -3950 ml   Filed Weights   09/27/19 1321  Weight: (!) 105.2 kg    Examination:   Constitutional: NAD, AAOx3 HEENT: conjunctivae and lids normal, EOMI CV: RRR no M,R,G. Distal pulses +2.  No cyanosis.   RESP: CTA B/L, normal respiratory effort, on 3L (baseline) GI: +BS, NTND Extremities: swelling in BLE improved from yesterday SKIN: warm, dry and intact Neuro: II - XII grossly intact.  Sensation intact Psych: Normal mood and affect.  Appropriate judgement and reason   Data Reviewed: I have personally reviewed following labs and imaging studies  CBC: Recent Labs  Lab 09/27/19 1325 09/30/19 0526  WBC 11.7* 9.9  NEUTROABS 9.2*  --   HGB 8.5* 9.0*  HCT 28.1* 29.8*  MCV 80.3 81.0  PLT 307 505   Basic Metabolic Panel: Recent Labs  Lab 09/27/19 1325 09/27/19 1325 09/28/19 0121 09/28/19 0509 09/28/19 2012 09/29/19 0513 09/29/19 1153 09/30/19 0526  NA 130*  --   --  134*  --  135  --  133*  K 3.6  --   --  3.8  --  4.0  --  4.3  CL 89*  --   --  89*  --  90*  --  90*  CO2 29  --   --  32  --  38*  --  34*  GLUCOSE 185*   < >  --  228* 445* 333* 469* 381*  BUN 18  --   --  18  --  21  --  21  CREATININE 0.85  --   --  0.88  --  1.01*  --  0.88  CALCIUM 8.7*  --   --  9.0  --  8.8*  --  9.0  MG  --   --  1.9  --   --   --   --  1.8   < > = values in this interval not displayed.   GFR: Estimated Creatinine Clearance: 64.1 mL/min (by C-G formula based on SCr of 0.88 mg/dL). Liver Function  Tests: Recent Labs  Lab 09/27/19 1325  AST 14*  ALT 11  ALKPHOS 68  BILITOT 0.6  PROT 6.8  ALBUMIN 3.6   No results for input(s): LIPASE, AMYLASE in the last 168 hours. No results for input(s): AMMONIA in the last 168 hours. Coagulation Profile: No results for input(s): INR, PROTIME in the last 168 hours. Cardiac Enzymes: No results for input(s): CKTOTAL, CKMB, CKMBINDEX, TROPONINI in the last 168 hours. BNP (last 3 results) No results for input(s): PROBNP in the last 8760 hours. HbA1C: Recent Labs    09/28/19 1419  HGBA1C 8.8*   CBG: Recent Labs  Lab 09/29/19 1632 09/29/19 2024 09/30/19 0743 09/30/19 1201 09/30/19 1609  GLUCAP 426* 354* 372* 274* 291*   Lipid Profile: No results for input(s): CHOL, HDL, LDLCALC, TRIG, CHOLHDL, LDLDIRECT in the last 72 hours. Thyroid Function Tests: No results for input(s): TSH, T4TOTAL, FREET4, T3FREE, THYROIDAB in the last 72 hours. Anemia Panel: No results for input(s): VITAMINB12, FOLATE, FERRITIN, TIBC, IRON, RETICCTPCT in the last 72 hours. Sepsis Labs: No results for input(s): PROCALCITON, LATICACIDVEN in the last 168 hours.  Recent Results (from the past 240 hour(s))  SARS Coronavirus 2 by RT PCR (hospital order, performed in [facility] hospital lab) Nasopharyngeal Nasopharyngeal Swab     Status: None   Collection Time: 09/28/19  1:17 AM   Specimen: Nasopharyngeal Swab  Result Value Ref Range Status   SARS Coronavirus 2 NEGATIVE NEGATIVE Final    Comment: (NOTE) SARS-CoV-2 target nucleic acids are NOT DETECTED.  The SARS-CoV-2 RNA is generally detectable in upper and lower respiratory specimens during the acute phase of infection. The lowest concentration of SARS-CoV-2 viral copies this assay can detect is 250 copies / mL. A negative result does not preclude SARS-CoV-2 infection and should not be used as the sole basis for treatment or other patient management decisions.  A negative result may occur with improper  specimen collection / handling, submission of specimen other than nasopharyngeal swab, presence of viral mutation(s) within the areas targeted by this assay, and inadequate number of viral copies (<250 copies / mL). A negative result must be combined with clinical observations, patient history, and epidemiological information.  Fact Sheet for Patients:   StrictlyIdeas.no  Fact Sheet for Healthcare Providers: BankingDealers.co.za  This test is not yet approved or  cleared by the [place] FDA and has been authorized for detection and/or diagnosis of  SARS-CoV-2 by FDA under an Emergency Use Authorization (EUA).  This EUA will remain in effect (meaning this test can be used) for the duration of the COVID-19 declaration under Section 564(b)(1) of the Act, 21 U.S.C. section 360bbb-3(b)(1), unless the authorization is terminated or revoked sooner.  Performed at [facility], [address] [place], [place] [address]       Radiology Studies: DG Abd 1 View  Result Date: 09/30/2019 CLINICAL DATA:  RIGHT lower quadrant pain today, history chronic kidney disease, diabetes mellitus, kidney stones, hypertension, appendectomy, cholecystectomy EXAM: ABDOMEN - 1 VIEW COMPARISON:  09/17/2017 FINDINGS: Air-filled loops of small bowel throughout abdomen without distension. Prominent stool at splenic flexure and proximal descending colon. No bowel wall thickening or evidence of obstruction. Air in food debris within stomach. Degenerative disc disease changes thoracolumbar spine. No urinary tract calcification evident. Surgical clips RIGHT upper quadrant from cholecystectomy. IMPRESSION: Nonobstructive bowel gas pattern. Electronically Signed   By: Lavonia Dana M.D.   On: 09/30/2019 12:18     Scheduled Meds: . apixaban  5 mg Oral BID  . aspirin EC  81 mg Oral Daily  . atorvastatin  10 mg Oral Daily  . ferrous sulfate  325 mg Oral Daily  .  fluticasone  2 spray Each Nare Daily  . fluticasone furoate-vilanterol  1 puff Inhalation Daily  . furosemide  40 mg Intravenous BID  . insulin aspart  0-9 Units Subcutaneous TID WC  . insulin aspart  10 Units Subcutaneous TID WC  . insulin glargine  35 Units Subcutaneous Daily  . ipratropium-albuterol  3 mL Inhalation TID  . mouth rinse  15 mL Mouth Rinse BID  . montelukast  10 mg Oral QHS  . pantoprazole  40 mg Oral Daily  . sodium chloride flush  3 mL Intravenous Q12H  . umeclidinium bromide  1 puff Inhalation Daily  . vitamin B-12  500 mcg Oral Daily   Continuous Infusions: . sodium chloride       LOS: 3 days     Enzo Bi, MD [facility] If 7PM-7AM, please contact night-coverage 09/30/2019, 5:34 PM

## 2019-10-01 DIAGNOSIS — I5031 Acute diastolic (congestive) heart failure: Secondary | ICD-10-CM | POA: Diagnosis not present

## 2019-10-01 LAB — BASIC METABOLIC PANEL
Anion gap: 11 (ref 5–15)
BUN: 29 mg/dL — ABNORMAL HIGH (ref 8–23)
CO2: 33 mmol/L — ABNORMAL HIGH (ref 22–32)
Calcium: 8.9 mg/dL (ref 8.9–10.3)
Chloride: 86 mmol/L — ABNORMAL LOW (ref 98–111)
Creatinine, Ser: 1.07 mg/dL — ABNORMAL HIGH (ref 0.44–1.00)
GFR calc Af Amer: 59 mL/min — ABNORMAL LOW (ref >60)
GFR calc non Af Amer: 51 mL/min — ABNORMAL LOW (ref >60)
Glucose, Bld: 427 mg/dL — ABNORMAL HIGH (ref 70–99)
Potassium: 3.9 mmol/L (ref 3.5–5.1)
Sodium: 130 mmol/L — ABNORMAL LOW (ref 135–145)

## 2019-10-01 LAB — CBC
HCT: 30.4 % — ABNORMAL LOW (ref 36.0–46.0)
Hemoglobin: 9.6 g/dL — ABNORMAL LOW (ref 12.0–15.0)
MCH: 24.7 pg — ABNORMAL LOW (ref 26.0–34.0)
MCHC: 31.6 g/dL (ref 30.0–36.0)
MCV: 78.4 fL — ABNORMAL LOW (ref 80.0–100.0)
Platelets: 322 10*3/uL (ref 150–400)
RBC: 3.88 MIL/uL (ref 3.87–5.11)
RDW: 16.9 % — ABNORMAL HIGH (ref 11.5–15.5)
WBC: 12.4 10*3/uL — ABNORMAL HIGH (ref 4.0–10.5)
nRBC: 0 % (ref 0.0–0.2)

## 2019-10-01 LAB — GLUCOSE, CAPILLARY
Glucose-Capillary: 447 mg/dL — ABNORMAL HIGH (ref 70–99)
Glucose-Capillary: 469 mg/dL — ABNORMAL HIGH (ref 70–99)
Glucose-Capillary: 502 mg/dL (ref 70–99)
Glucose-Capillary: 344 mg/dL — ABNORMAL HIGH (ref 70–99)
Glucose-Capillary: 288 mg/dL — ABNORMAL HIGH (ref 70–99)
Glucose-Capillary: 170 mg/dL — ABNORMAL HIGH (ref 70–99)

## 2019-10-01 LAB — MAGNESIUM: Magnesium: 1.9 mg/dL (ref 1.7–2.4)

## 2019-10-01 MED ORDER — INSULIN ASPART 100 UNIT/ML ~~LOC~~ SOLN
20.0000 [IU] | Freq: Once | SUBCUTANEOUS | Status: AC
  Administered 2019-10-01: 20 [IU] via SUBCUTANEOUS
  Filled 2019-10-01: qty 1

## 2019-10-01 MED ORDER — INSULIN GLARGINE 100 UNIT/ML ~~LOC~~ SOLN
45.0000 [IU] | Freq: Every day | SUBCUTANEOUS | Status: DC
  Administered 2019-10-01: 12:00:00 45 [IU] via SUBCUTANEOUS
  Filled 2019-10-01 (×2): qty 0.45

## 2019-10-01 MED ORDER — INSULIN ASPART 100 UNIT/ML ~~LOC~~ SOLN
15.0000 [IU] | Freq: Three times a day (TID) | SUBCUTANEOUS | Status: DC
  Administered 2019-10-01 – 2019-10-02 (×4): 15 [IU] via SUBCUTANEOUS
  Filled 2019-10-01 (×4): qty 1

## 2019-10-01 NOTE — TOC Progression Note (Signed)
Transition of Care [facility]) - Progression Note    Patient Details  Name: Karen [name] MRN: [id number] Date of Birth: 1946-02-19  [facility] CM/SW Contact  Shelbie Ammons, RN Phone Number: 10/01/2019, 3:23 PM  Clinical Narrative:   RNCM called patient in room to discuss recommendation for home health PT. Patient reports that she will not accept home health services, she hasn't in the past and she doesn't want them now. RNCM reassured patient that this decision is completely up to her but it must be offered. No other needs verbalized at this time.     Expected Discharge Plan: Home/Self Care Barriers to Discharge: Continued Medical Work up  Expected Discharge Plan and Services Expected Discharge Plan: Home/Self Care   Discharge Planning Services: CM Consult   Living arrangements for the past 2 months: Single Family Home                                       Social Determinants of Health (SDOH) Interventions    Readmission Risk Interventions Readmission Risk Prevention Plan 09/30/2019 02/26/2019 09/17/2018  Transportation Screening Complete Complete Complete  PCP or Specialist Appt within 5-7 Days - - -  PCP or Specialist Appt within 3-5 Days Complete - -  Home Care Screening - - -  Medication Review (RN CM) - - -  Social Work Scientific laboratory technician for [place] Planning/Counseling Complete Complete -  Palliative Care Screening Not Applicable Not Applicable -  Medication Review (RN Care Manager) Complete Referral to Pharmacy Complete  PCP or Specialist appointment within 3-5 days of discharge - - Not Complete  PCP/Specialist Appt Not Complete comments - - PCP appointment 7/20  [place] or [place] - - Complete  [place] - - Not Applicable  Some recent data might be hidden

## 2019-10-01 NOTE — Progress Notes (Signed)
Inpatient Diabetes Program Recommendations  AACE/ADA: New Consensus Statement on Inpatient Glycemic Control Target Ranges:  Prepandial:   less than 140 mg/dL      Peak postprandial:   less than 180 mg/dL (1-2 hours)      Critically ill patients:  140 - 180 mg/dL   Results for RAVINA, MILNER (MRN [id number]) as of 10/01/2019 12:06  Ref. Range 09/30/2019 07:43 09/30/2019 12:01 09/30/2019 16:09 09/30/2019 22:08 10/01/2019 08:33 10/01/2019 11:35  Glucose-Capillary Latest Ref Range: 70 - 99 mg/dL 372 (H) 274 (H) 291 (H) 363 (H) 447 (H) 502 (HH)   Review of Glycemic Control  Diabetes history:DM2 Outpatient Diabetes medications:V-Go 40 insulin pump with Humalog (takes 5,4,6 clicks with small, medium, or large meal; plus 1-2 clicks for hyperglycemia), Metformin 1000 mg BID, Invokana 100 mg daily Current orders for Inpatient glycemic control: Lantus 45 units daily, Novolog 15 units TID with meals for meal coverage, Novolog 0-9 units TID with meals  Inpatient Diabetes Program Recommendations:  Insulin-Please consider increasing Novolog correction to resistant scale (0-20 units) and add bedtime correction as well.  NOTE: Noted Lantus was increased from 35 to 45 units and Novolog meal coverage from 10 to 15 units TID with meals today.  Thanks, Barnie Alderman, RN, MSN, CDE Diabetes Coordinator Inpatient Diabetes Program [phone] (Team Pager from 8am to 5pm)

## 2019-10-01 NOTE — Progress Notes (Addendum)
PROGRESS NOTE    Karen [name]  OEH:[id number] DOB: 17-Dec-1945 DOA: 09/27/2019 PCP: Ricardo Jericho, NP    Assessment & Plan:   Principal Problem:   Acute diastolic CHF (congestive heart failure) ([place]) Active Problems:   Diabetes mellitus type 2, uncomplicated (HCC)   Hypertension   Iron deficiency anemia   Obesity (BMI 35.0-39.9 without comorbidity)   Chronic respiratory failure with hypoxia (HCC)   (HFpEF) heart failure with preserved ejection fraction (HCC)   COPD with acute exacerbation (HCC)    Karen [name] is a 74 y.o. female with a pertinent history of COPD on 3 L of oxygen and inhalers, type 2 diabetes, hypertension, diastolic heart failure, CKD2 who presents to the ED with worsening shortness of breath with exertion and LE edema.   heart failure exacerbation  --CXR showed "pulmonary vascular congestion and probable minimal pulmonary edema." PLAN: --IV lasix 40 mg BID --Strict I/O -TTE    COPD, exacerbation ruled out Chronic hypoxic respiratory failure on 3 L  --continue home daily bronchodilators  Hyponatremia 130 --likely due to hypervolemia, improved with diuresis --Monitor Na --continue diuresis  Anemia, suspect component of iron deficiency, history of B12 as well  Paroxysmal Atrial fibrillation on Eliquis distant history of a DVT --continue home Eliquis  HTN   T2DM  --on home insulin pump, which was removed --increase Lantus to 45u daily  --increase meal-time to 15u TID --SSI TID --1x aspart 20u today   DVT prophylaxis: IB:[id number] Code Status: DNR  Family Communication:  Status is: inpatient Dispo:   The patient is from: home Anticipated d/c is to: home Anticipated d/c date is: tomorrow Patient currently is not medically stable to d/c due to: on IV diuretic for CHF exacerbation, pt BG uncontrolled.   Subjective and Interval History:  Pt reported feeling weak and unwell today.  Breathing back to baseline.  BG has  been elevated, despite insulin increase.   Objective: Vitals:   10/01/19 1136 10/01/19 1538 10/01/19 1635 10/01/19 1939  BP: (!) 132/77  (!) 137/53   Pulse: 79  71   Resp: 19  20   Temp: 97.6 F (36.4 C)  98.3 F (36.8 C)   TempSrc: Oral  Oral   SpO2: 97% 95% 97% 97%  Weight:      Height:        Intake/Output Summary (Last 24 hours) at 10/01/2019 1944 Last data filed at 10/01/2019 1835 Gross per 24 hour  Intake 600 ml  Output 3850 ml  Net -3250 ml   Filed Weights   09/27/19 1321 10/01/19 0445  Weight: (!) 105.2 kg (!) 97.8 kg    Examination:   Constitutional: NAD, AAOx3 HEENT: conjunctivae and lids normal, EOMI CV: RRR no M,R,G. Distal pulses +2.  No cyanosis.   RESP: CTA B/L, normal respiratory effort, on 3L (baseline) GI: +BS, NTND Extremities: swelling in BLE improved from yesterday SKIN: warm, dry and intact Neuro: II - XII grossly intact.  Sensation intact Psych: Depressed mood and affect.    Data Reviewed: I have personally reviewed following labs and imaging studies  CBC: Recent Labs  Lab 09/27/19 1325 09/30/19 0526 10/01/19 0520  WBC 11.7* 9.9 12.4*  NEUTROABS 9.2*  --   --   HGB 8.5* 9.0* 9.6*  HCT 28.1* 29.8* 30.4*  MCV 80.3 81.0 78.4*  PLT 307 315 169   Basic Metabolic Panel: Recent Labs  Lab 09/27/19 1325 09/27/19 1325 09/28/19 0121 09/28/19 0509 09/28/19 0509 09/28/19 2012 09/29/19 0513 09/29/19 1153  09/30/19 0526 10/01/19 0520  NA 130*  --   --  134*  --   --  135  --  133* 130*  K 3.6  --   --  3.8  --   --  4.0  --  4.3 3.9  CL 89*  --   --  89*  --   --  90*  --  90* 86*  CO2 29  --   --  32  --   --  38*  --  34* 33*  GLUCOSE 185*   < >  --  228*   < > 445* 333* 469* 381* 427*  BUN 18  --   --  18  --   --  21  --  21 29*  CREATININE 0.85  --   --  0.88  --   --  1.01*  --  0.88 1.07*  CALCIUM 8.7*  --   --  9.0  --   --  8.8*  --  9.0 8.9  MG  --   --  1.9  --   --   --   --   --  1.8 1.9   < > = values in this interval  not displayed.   GFR: Estimated Creatinine Clearance: 50.6 mL/min (A) (by C-G formula based on SCr of 1.07 mg/dL (H)). Liver Function Tests: Recent Labs  Lab 09/27/19 1325  AST 14*  ALT 11  ALKPHOS 68  BILITOT 0.6  PROT 6.8  ALBUMIN 3.6   No results for input(s): LIPASE, AMYLASE in the last 168 hours. No results for input(s): AMMONIA in the last 168 hours. Coagulation Profile: No results for input(s): INR, PROTIME in the last 168 hours. Cardiac Enzymes: No results for input(s): CKTOTAL, CKMB, CKMBINDEX, TROPONINI in the last 168 hours. BNP (last 3 results) No results for input(s): PROBNP in the last 8760 hours. HbA1C: No results for input(s): HGBA1C in the last 72 hours. CBG: Recent Labs  Lab 10/01/19 0833 10/01/19 1135 10/01/19 1223 10/01/19 1458 10/01/19 1636  GLUCAP 447* 502* 469* 344* 288*   Lipid Profile: No results for input(s): CHOL, HDL, LDLCALC, TRIG, CHOLHDL, LDLDIRECT in the last 72 hours. Thyroid Function Tests: No results for input(s): TSH, T4TOTAL, FREET4, T3FREE, THYROIDAB in the last 72 hours. Anemia Panel: No results for input(s): VITAMINB12, FOLATE, FERRITIN, TIBC, IRON, RETICCTPCT in the last 72 hours. Sepsis Labs: No results for input(s): PROCALCITON, LATICACIDVEN in the last 168 hours.  Recent Results (from the past 240 hour(s))  SARS Coronavirus 2 by RT PCR (hospital order, performed in [facility] hospital lab) Nasopharyngeal Nasopharyngeal Swab     Status: None   Collection Time: 09/28/19  1:17 AM   Specimen: Nasopharyngeal Swab  Result Value Ref Range Status   SARS Coronavirus 2 NEGATIVE NEGATIVE Final    Comment: (NOTE) SARS-CoV-2 target nucleic acids are NOT DETECTED.  The SARS-CoV-2 RNA is generally detectable in upper and lower respiratory specimens during the acute phase of infection. The lowest concentration of SARS-CoV-2 viral copies this assay can detect is 250 copies / mL. A negative result does not preclude SARS-CoV-2  infection and should not be used as the sole basis for treatment or other patient management decisions.  A negative result may occur with improper specimen collection / handling, submission of specimen other than nasopharyngeal swab, presence of viral mutation(s) within the areas targeted by this assay, and inadequate number of viral copies (<250 copies / mL). A negative result  must be combined with clinical observations, patient history, and epidemiological information.  Fact Sheet for Patients:   StrictlyIdeas.no  Fact Sheet for Healthcare Providers: BankingDealers.co.za  This test is not yet approved or  cleared by the [place] FDA and has been authorized for detection and/or diagnosis of SARS-CoV-2 by FDA under an Emergency Use Authorization (EUA).  This EUA will remain in effect (meaning this test can be used) for the duration of the COVID-19 declaration under Section 564(b)(1) of the Act, 21 U.S.C. section 360bbb-3(b)(1), unless the authorization is terminated or revoked sooner.  Performed at [facility], [address] [place], [place] [address]       Radiology Studies: DG Abd 1 View  Result Date: 09/30/2019 CLINICAL DATA:  RIGHT lower quadrant pain today, history chronic kidney disease, diabetes mellitus, kidney stones, hypertension, appendectomy, cholecystectomy EXAM: ABDOMEN - 1 VIEW COMPARISON:  09/17/2017 FINDINGS: Air-filled loops of small bowel throughout abdomen without distension. Prominent stool at splenic flexure and proximal descending colon. No bowel wall thickening or evidence of obstruction. Air in food debris within stomach. Degenerative disc disease changes thoracolumbar spine. No urinary tract calcification evident. Surgical clips RIGHT upper quadrant from cholecystectomy. IMPRESSION: Nonobstructive bowel gas pattern. Electronically Signed   By: Lavonia Dana M.D.   On: 09/30/2019 12:18      Scheduled Meds: . apixaban  5 mg Oral BID  . aspirin EC  81 mg Oral Daily  . atorvastatin  10 mg Oral Daily  . ferrous sulfate  325 mg Oral Daily  . fluticasone  2 spray Each Nare Daily  . fluticasone furoate-vilanterol  1 puff Inhalation Daily  . furosemide  40 mg Intravenous BID  . insulin aspart  0-9 Units Subcutaneous TID WC  . insulin aspart  15 Units Subcutaneous TID WC  . insulin glargine  45 Units Subcutaneous Daily  . ipratropium-albuterol  3 mL Inhalation TID  . mouth rinse  15 mL Mouth Rinse BID  . montelukast  10 mg Oral QHS  . pantoprazole  40 mg Oral Daily  . sodium chloride flush  3 mL Intravenous Q12H  . umeclidinium bromide  1 puff Inhalation Daily  . vitamin B-12  500 mcg Oral Daily   Continuous Infusions: . sodium chloride       LOS: 4 days     Enzo Bi, MD [facility] If 7PM-7AM, please contact night-coverage 10/01/2019, 7:44 PM

## 2019-10-02 LAB — BASIC METABOLIC PANEL
Anion gap: 12 (ref 5–15)
BUN: 32 mg/dL — ABNORMAL HIGH (ref 8–23)
CO2: 33 mmol/L — ABNORMAL HIGH (ref 22–32)
Calcium: 9.1 mg/dL (ref 8.9–10.3)
Chloride: 87 mmol/L — ABNORMAL LOW (ref 98–111)
Creatinine, Ser: 1.03 mg/dL — ABNORMAL HIGH (ref 0.44–1.00)
GFR calc Af Amer: >60 mL/min (ref >60)
GFR calc non Af Amer: 53 mL/min — ABNORMAL LOW (ref >60)
Glucose, Bld: 290 mg/dL — ABNORMAL HIGH (ref 70–99)
Potassium: 4 mmol/L (ref 3.5–5.1)
Sodium: 132 mmol/L — ABNORMAL LOW (ref 135–145)

## 2019-10-02 LAB — CBC
HCT: 32.4 % — ABNORMAL LOW (ref 36.0–46.0)
Hemoglobin: 10.3 g/dL — ABNORMAL LOW (ref 12.0–15.0)
MCH: 24.9 pg — ABNORMAL LOW (ref 26.0–34.0)
MCHC: 31.8 g/dL (ref 30.0–36.0)
MCV: 78.3 fL — ABNORMAL LOW (ref 80.0–100.0)
Platelets: 302 10*3/uL (ref 150–400)
RBC: 4.14 MIL/uL (ref 3.87–5.11)
RDW: 16.9 % — ABNORMAL HIGH (ref 11.5–15.5)
WBC: 12.8 10*3/uL — ABNORMAL HIGH (ref 4.0–10.5)
nRBC: 0 % (ref 0.0–0.2)

## 2019-10-02 LAB — GLUCOSE, CAPILLARY
Glucose-Capillary: 310 mg/dL — ABNORMAL HIGH (ref 70–99)
Glucose-Capillary: 422 mg/dL — ABNORMAL HIGH (ref 70–99)
Glucose-Capillary: 466 mg/dL — ABNORMAL HIGH (ref 70–99)
Glucose-Capillary: 323 mg/dL — ABNORMAL HIGH (ref 70–99)

## 2019-10-02 LAB — IRON AND TIBC
Iron: 36 ug/dL (ref 28–170)
Saturation Ratios: 8 % — ABNORMAL LOW (ref 10.4–31.8)
TIBC: 459 ug/dL — ABNORMAL HIGH (ref 250–450)
UIBC: 423 ug/dL

## 2019-10-02 LAB — MAGNESIUM: Magnesium: 1.8 mg/dL (ref 1.7–2.4)

## 2019-10-02 LAB — FOLATE: Folate: 20.7 ng/mL (ref >5.9)

## 2019-10-02 LAB — VITAMIN B12: Vitamin B-12: 2051 pg/mL — ABNORMAL HIGH (ref 180–914)

## 2019-10-02 MED ORDER — INSULIN GLARGINE 100 UNIT/ML ~~LOC~~ SOLN
45.0000 [IU] | Freq: Once | SUBCUTANEOUS | Status: DC
  Filled 2019-10-02: qty 0.45

## 2019-10-02 MED ORDER — INSULIN GLARGINE 100 UNIT/ML ~~LOC~~ SOLN
55.0000 [IU] | Freq: Every day | SUBCUTANEOUS | Status: DC
  Filled 2019-10-02: qty 0.55

## 2019-10-02 MED ORDER — INSULIN ASPART 100 UNIT/ML ~~LOC~~ SOLN
20.0000 [IU] | Freq: Once | SUBCUTANEOUS | Status: AC
  Administered 2019-10-02: 12:00:00 20 [IU] via SUBCUTANEOUS
  Filled 2019-10-02: qty 1

## 2019-10-02 NOTE — Care Management Important Message (Signed)
Important Message  Patient Details  Name: Karen [name] MRN: [id number] Date of Birth: 07-Feb-1946   Medicare Important Message Given:  Yes     Juliann Pulse A Germaine Shenker 10/02/2019, 9:52 AM

## 2019-10-02 NOTE — Discharge Summary (Signed)
Physician Discharge Summary   Karen [name]  female DOB: [birth year]  CXK:[id number]  PCP: Ricardo Jericho, NP  Admit date: 09/27/2019 Discharge date: 10/02/2019  Admitted From: home Disposition:  home Home Health: pt refused HH  CODE STATUS: DNR  Discharge Instructions    Discharge instructions   Complete by: As directed    You have received IV lasix and gotten about 10 liters fluid out of your body.  After discharge, you can go back to your home oral Lasix regimen.  Please weigh yourself every morning to monitor.  If weight increases 3 lbs in 2 days or 5 lbs in a week, please call your outpatient doctor.  Your blood sugars have been very high even while we were giving you higher amount of insulin in the hospital.  Please follow the recommendation of the diabetic educator, put yourself on your insulin pump right away when you get home (to get the basal long-acting going), and check and give yourself corrective insulin.  Please follow up with your Endocrin provider preferably tomorrow.   Dr. Enzo Bi [facility] Course:  For full details, please see H&P, progress notes, consult notes and ancillary notes.  Briefly,  Karen [name] a 74 y.o.Caucasian femalewith a pertinent history ofCOPD on 3 L of oxygen and inhalers, type 2 diabetes, hypertension, diastolic heart failure, [id number] presents to the ED withworsening shortness of breath with exertion and LE edema.   Acute on chronic diastolic heart failure exacerbation  CXR showed "pulmonary vascular congestion and probable minimal pulmonary edema."  TTE showed normal systolic but grade I diastolic dysfunction.  Pt received IV lasix 40 mg BID for 4 days with resultant -12L out.  Pt's dyspnea improved prior to discharge and was on her baseline 3L O2.   COPD, exacerbation ruled out Chronic hypoxic respiratory failure on 3 L  Pt did not have increased cough or wheezing.  Dyspnea was more likely due CHF  exacerbation.  Pt was continued on home daily bronchodilators.  Acute on chronic Hyponatremia  Na 130 on presentation, likely due to hypervolemia, improved with diuresis.  Na 132 on the day of discharge.  Anemia, 2/2 mild iron def and chronic disease Hgb 9-10.  Folate and B12 wnl.  Iron sat 8%, low.  Continued on home iron suppl.  Paroxysmal Atrial fibrillation on Eliquis distant history of a DVT continued home Eliquis  HTN  BP acceptable during hospitalization.  Home BP meds held while on IV lasix.  Home BP meds resumed at discharge.  T2DM on insulin pump Hyperglycemia Home insulin pump was removed by admitting physician.  Pt's BG's were severely elevated and difficult to control even while giving  Lantus to 45u daily, meal-time to 15u TID, and max amount of SSI TID, which were more insulin than what pt was receiving from her insulin pump.  Prior to discharge, an attempt was made to reach pt's outpatient endocrin provider to discuss insulin regimen after discharge, however, was not able to reach the provider.  Inpatient diabetic educator had a discussion with me and the pt, and decision was made to discharge pt back on her home regimen with her insulin pump, but to follow up with her outpatient endocrin provider as soon as possible.     Discharge Diagnoses:  Principal Problem:   Acute diastolic CHF (congestive heart failure) (HCC) Active Problems:   Diabetes mellitus type 2, uncomplicated (HCC)   Hypertension   Iron deficiency anemia  Obesity (BMI 35.0-39.9 without comorbidity)   Chronic respiratory failure with hypoxia (HCC)   (HFpEF) heart failure with preserved ejection fraction (HCC)   COPD with acute exacerbation [facility])    Discharge Instructions:  Allergies as of 10/02/2019      Reactions   Ace Inhibitors Hives   Beta Adrenergic Blockers    Junctional bradycardia   Gabapentin Hives   Lisinopril Hives   Lyrica [pregabalin] Hives   Shrimp [shellfish Allergy]  Swelling   Swelling of the lips      Medication List    TAKE these medications   acetaminophen 500 MG tablet Commonly known as: TYLENOL Take 500-1,000 mg by mouth every 6 (six) hours as needed for mild pain or fever.   albuterol 108 (90 Base) MCG/ACT inhaler Commonly known as: VENTOLIN HFA Inhale 2 puffs into the lungs every 6 (six) hours as needed for wheezing or shortness of breath.   apixaban 5 MG Tabs tablet Commonly known as: ELIQUIS Take 1 tablet (5 mg total) by mouth 2 (two) times daily.   atorvastatin 10 MG tablet Commonly known as: LIPITOR Take 10 mg by mouth daily.   esomeprazole 40 MG capsule Commonly known as: NEXIUM Take 40 mg by mouth daily.   fluticasone 50 MCG/ACT nasal spray Commonly known as: FLONASE Place 2 sprays into both nostrils daily.   Fluticasone-Umeclidin-Vilant 100-62.5-25 MCG/INH Aepb Inhale 1 puff into the lungs daily.   furosemide 20 MG tablet Commonly known as: LASIX Take 2 tablets (40 mg total) by mouth as directed. Take 2 tablets (40 mg) in the AM and extra 1-2 tablets (20-40 mg) in the afternoon for leg swelling or shortness of breath   insulin lispro 100 UNIT/ML injection Commonly known as: HUMALOG Inject 0-76 Units into the skin continuous. (via V-Go disposable insulin pump)   Invokana 100 MG Tabs tablet Generic drug: canagliflozin Take 100 mg by mouth daily.   ipratropium-albuterol 0.5-2.5 (3) MG/3ML Soln Commonly known as: DUONEB Inhale 3 mLs into the lungs 4 (four) times daily as needed (shortness of breath or wheezing).   Iron 325 (65 Fe) MG Tabs Take 1 tablet by mouth daily.   losartan-hydrochlorothiazide 100-25 MG tablet Commonly known as: HYZAAR Take 1 tablet by mouth daily.   metFORMIN 500 MG tablet Commonly known as: GLUCOPHAGE Take 1,000 mg by mouth 2 (two) times daily with a meal.   montelukast 10 MG tablet Commonly known as: SINGULAIR Take 10 mg by mouth at bedtime.   potassium chloride 10 MEQ  tablet Commonly known as: KLOR-CON Take 1 tablet (10 mEq total) by mouth as directed. Take one tablet daily and when you take extra furosemide take extra tablet   vitamin B-12 500 MCG tablet Commonly known as: CYANOCOBALAMIN Take 500 mcg by mouth daily.        Follow-up Information    [facility] Follow up on 11/03/2019.   Specialty: Cardiology Why: at 2:00pm. Enter through the [facility] entrance Contact information: [address] [place] [phone]       Ricardo Jericho, NP. Go on 10/13/2019.   Specialty: Family Medicine Why: @ 2:40pm with Dr Abner Greenspan information: [address] [place] [address] [phone]        [name], MD. Go on 10/14/2019.   Specialty: Cardiology Why: @ 11:30am Contact information: [place] [address] [phone]  Allergies  Allergen Reactions  . Ace Inhibitors Hives  . Beta Adrenergic Blockers     Junctional bradycardia  . Gabapentin Hives  . Lisinopril Hives  . Lyrica [Pregabalin] Hives  . Shrimp [Shellfish Allergy] Swelling    Swelling of the lips     The results of significant diagnostics from this hospitalization (including imaging, microbiology, ancillary and laboratory) are listed below for reference.   Consultations:   Procedures/Studies: DG Chest 2 View  Result Date: 09/27/2019 CLINICAL DATA:  Shortness of breath, 8 pounds weight gain 3 days, history CHF, stage III chronic kidney disease, COPD, diabetes mellitus, hypertension EXAM: CHEST - 2 VIEW COMPARISON:  06/01/2019 FINDINGS: Enlargement of cardiac silhouette with pulmonary vascular congestion. Atherosclerotic calcification aorta. Interstitial prominence in the mid to lower lungs likely representing minimal pulmonary edema. No segmental consolidation, pleural effusion or pneumothorax. Bones demineralized.  IMPRESSION: Enlargement of cardiac silhouette with pulmonary vascular congestion and probable minimal pulmonary edema. Aortic Atherosclerosis (ICD10-I70.0). Electronically Signed   By: Lavonia Dana M.D.   On: 09/27/2019 14:00   DG Abd 1 View  Result Date: 09/30/2019 CLINICAL DATA:  RIGHT lower quadrant pain today, history chronic kidney disease, diabetes mellitus, kidney stones, hypertension, appendectomy, cholecystectomy EXAM: ABDOMEN - 1 VIEW COMPARISON:  09/17/2017 FINDINGS: Air-filled loops of small bowel throughout abdomen without distension. Prominent stool at splenic flexure and proximal descending colon. No bowel wall thickening or evidence of obstruction. Air in food debris within stomach. Degenerative disc disease changes thoracolumbar spine. No urinary tract calcification evident. Surgical clips RIGHT upper quadrant from cholecystectomy. IMPRESSION: Nonobstructive bowel gas pattern. Electronically Signed   By: Lavonia Dana M.D.   On: 09/30/2019 12:18   ECHOCARDIOGRAM COMPLETE  Result Date: 09/29/2019    ECHOCARDIOGRAM REPORT   Patient Name:   Karen [name] Date of Exam: 09/28/2019 Medical Rec #:  [id number]        Height:       63.0 in Accession #:    [id number]       Weight:       232.0 lb Date of Birth:  August 12, 1945       BSA:          2.060 m Patient Age:    86 years         BP:           139/78 mmHg Patient Gender: F                HR:           102 bpm. Exam Location:  [facility] Procedure: 2D Echo, Cardiac Doppler and Color Doppler Indications:     CHF-Acute Diastolic 408.14 / G81.85  History:         Patient has prior history of Echocardiogram examinations. CHF,                  COPD; Risk Factors:Hypertension.  Sonographer:     Alyse Low Roar Referring Phys:  [id number] Sueanne Margarita Diagnosing Phys: Kathlyn Sacramento MD IMPRESSIONS  1. Left ventricular ejection fraction, by estimation, is 55 to 60%. The left ventricle has normal function. The left ventricle has no regional wall motion abnormalities.  There is mild left ventricular hypertrophy. Left ventricular diastolic parameters are consistent with Grade I diastolic dysfunction (impaired relaxation).  2. Right ventricular systolic function is normal. The right ventricular size is normal. There is severely elevated pulmonary artery systolic pressure. The estimated right ventricular systolic pressure is 26.3 mmHg.  3. Left atrial size  was mildly dilated.  4. The mitral valve is normal in structure. Trivial mitral valve regurgitation. No evidence of mitral stenosis.  5. The aortic valve is abnormal. Aortic valve regurgitation is not visualized. Moderate aortic valve stenosis. Aortic valve area, by VTI measures 0.94 cm. Aortic valve mean gradient measures 17.3 mmHg. FINDINGS  Left Ventricle: Left ventricular ejection fraction, by estimation, is 55 to 60%. The left ventricle has normal function. The left ventricle has no regional wall motion abnormalities. The left ventricular internal cavity size was normal in size. There is  mild left ventricular hypertrophy. Left ventricular diastolic parameters are consistent with Grade I diastolic dysfunction (impaired relaxation). Right Ventricle: The right ventricular size is normal. No increase in right ventricular wall thickness. Right ventricular systolic function is normal. There is severely elevated pulmonary artery systolic pressure. The tricuspid regurgitant velocity is 3.66 m/s, and with an assumed right atrial pressure of 10 mmHg, the estimated right ventricular systolic pressure is 39.0 mmHg. Left Atrium: Left atrial size was mildly dilated. Right Atrium: Right atrial size was normal in size. Pericardium: There is no evidence of pericardial effusion. Mitral Valve: The mitral valve is normal in structure. Normal mobility of the mitral valve leaflets. Trivial mitral valve regurgitation. No evidence of mitral valve stenosis. Tricuspid Valve: The tricuspid valve is normal in structure. Tricuspid valve regurgitation is  mild . No evidence of tricuspid stenosis. Aortic Valve: The aortic valve is abnormal. Aortic valve regurgitation is not visualized. Moderate aortic stenosis is present. Aortic valve mean gradient measures 17.3 mmHg. Aortic valve peak gradient measures 31.4 mmHg. Aortic valve area, by VTI measures 0.94 cm. Pulmonic Valve: The pulmonic valve was normal in structure. Pulmonic valve regurgitation is not visualized. No evidence of pulmonic stenosis. Aorta: The aortic root is normal in size and structure. Venous: The inferior vena cava was not well visualized. IAS/Shunts: No atrial level shunt detected by color flow Doppler.  LEFT VENTRICLE PLAX 2D LVIDd:         4.65 cm  Diastology LVIDs:         3.36 cm  LV e' lateral:   10.90 cm/s LV PW:         1.19 cm  LV E/e' lateral: 11.0 LV IVS:        1.40 cm  LV e' medial:    5.66 cm/s LVOT diam:     1.70 cm  LV E/e' medial:  21.2 LV SV:         51 LV SV Index:   25 LVOT Area:     2.27 cm  RIGHT VENTRICLE RV Mid diam:    2.71 cm RV S prime:     17.90 cm/s TAPSE (M-mode): 2.3 cm LEFT ATRIUM             Index       RIGHT ATRIUM           Index LA diam:        3.70 cm 1.80 cm/m  RA Area:     17.30 cm LA Vol (A2C):   66.0 ml 32.05 ml/m RA Volume:   41.00 ml  19.91 ml/m LA Vol (A4C):   65.9 ml 32.00 ml/m LA Biplane Vol: 68.8 ml 33.41 ml/m  AORTIC VALVE                    PULMONIC VALVE AV Area (Vmax):    0.98 cm     PV Vmax:       1.40  m/s AV Area (Vmean):   0.95 cm     PV Peak grad:  7.8 mmHg AV Area (VTI):     0.94 cm AV Vmax:           280.00 cm/s AV Vmean:          194.667 cm/s AV VTI:            0.542 m AV Peak Grad:      31.4 mmHg AV Mean Grad:      17.3 mmHg LVOT Vmax:         121.00 cm/s LVOT Vmean:        81.100 cm/s LVOT VTI:          0.225 m LVOT/AV VTI ratio: 0.42  AORTA Ao Root diam: 2.90 cm MITRAL VALVE                TRICUSPID VALVE MV Area (PHT): 3.46 cm     TR Peak grad:   53.6 mmHg MV Decel Time: 219 msec     TR Vmax:        366.00 cm/s MV E velocity:  120.00 cm/s MV A velocity: 162.00 cm/s  SHUNTS MV E/A ratio:  0.74         Systemic VTI:  0.22 m MV A Prime:    15.4 cm/s    Systemic Diam: 1.70 cm Kathlyn Sacramento MD Electronically signed by Kathlyn Sacramento MD Signature Date/Time: 09/29/2019/9:19:29 AM    Final       Labs: BNP (last 3 results) Recent Labs    06/04/19 1448 06/28/19 1117 09/27/19 1325  BNP 45.0 40.0 60.4   Basic Metabolic Panel: Recent Labs  Lab 09/27/19 1325 09/28/19 0121 09/28/19 0509 09/28/19 2012 09/29/19 0513 09/29/19 1153 09/30/19 0526 10/01/19 0520 10/02/19 0522  NA   < >  --  134*  --  135  --  133* 130* 132*  K   < >  --  3.8  --  4.0  --  4.3 3.9 4.0  CL   < >  --  89*  --  90*  --  90* 86* 87*  CO2   < >  --  32  --  38*  --  34* 33* 33*  GLUCOSE   < >  --  228*   < > 333* 469* 381* 427* 290*  BUN   < >  --  18  --  21  --  21 29* 32*  CREATININE   < >  --  0.88  --  1.01*  --  0.88 1.07* 1.03*  CALCIUM   < >  --  9.0  --  8.8*  --  9.0 8.9 9.1  MG  --  1.9  --   --   --   --  1.8 1.9 1.8   < > = values in this interval not displayed.   Liver Function Tests: Recent Labs  Lab 09/27/19 1325  AST 14*  ALT 11  ALKPHOS 68  BILITOT 0.6  PROT 6.8  ALBUMIN 3.6   No results for input(s): LIPASE, AMYLASE in the last 168 hours. No results for input(s): AMMONIA in the last 168 hours. CBC: Recent Labs  Lab 09/27/19 1325 09/30/19 0526 10/01/19 0520 10/02/19 0522  WBC 11.7* 9.9 12.4* 12.8*  NEUTROABS 9.2*  --   --   --   HGB 8.5* 9.0* 9.6* 10.3*  HCT 28.1* 29.8* 30.4* 32.4*  MCV 80.3 81.0 78.4* 78.3*  PLT 307 315 322 302   Cardiac Enzymes: No results for input(s): CKTOTAL, CKMB, CKMBINDEX, TROPONINI in the last 168 hours. BNP: Invalid input(s): POCBNP CBG: Recent Labs  Lab 10/01/19 1636 10/01/19 2008 10/02/19 0727 10/02/19 1151 10/02/19 1307  GLUCAP 288* 170* 310* 422* 466*   D-Dimer No results for input(s): DDIMER in the last 72 hours. Hgb A1c No results for input(s): HGBA1C  in the last 72 hours. Lipid Profile No results for input(s): CHOL, HDL, LDLCALC, TRIG, CHOLHDL, LDLDIRECT in the last 72 hours. Thyroid function studies No results for input(s): TSH, T4TOTAL, T3FREE, THYROIDAB in the last 72 hours.  Invalid input(s): FREET3 Anemia work up Recent Labs    10/02/19 0522  VITAMINB12 2,051*  FOLATE 20.7  TIBC 459*  IRON 36   Urinalysis    Component Value Date/Time   COLORURINE YELLOW 06/29/2019 0939   APPEARANCEUR HAZY (A) 06/29/2019 0939   APPEARANCEUR Cloudy (A) 09/28/2017 1338   LABSPEC 1.015 06/29/2019 0939   LABSPEC 1.024 04/02/2014 0517   PHURINE 7.0 06/29/2019 0939   GLUCOSEU NEGATIVE 06/29/2019 0939   GLUCOSEU >=500 04/02/2014 0517   HGBUR TRACE (A) 06/29/2019 0939   BILIRUBINUR NEGATIVE 06/29/2019 0939   BILIRUBINUR Negative 09/28/2017 1338   BILIRUBINUR Negative 04/02/2014 0517   KETONESUR NEGATIVE 06/29/2019 0939   PROTEINUR NEGATIVE 06/29/2019 0939   NITRITE NEGATIVE 06/29/2019 0939   LEUKOCYTESUR LARGE (A) 06/29/2019 0939   LEUKOCYTESUR Trace 04/02/2014 0517   Sepsis Labs Invalid input(s): PROCALCITONIN,  WBC,  LACTICIDVEN Microbiology Recent Results (from the past 240 hour(s))  SARS Coronavirus 2 by RT PCR (hospital order, performed in [facility] hospital lab) Nasopharyngeal Nasopharyngeal Swab     Status: None   Collection Time: 09/28/19  1:17 AM   Specimen: Nasopharyngeal Swab  Result Value Ref Range Status   SARS Coronavirus 2 NEGATIVE NEGATIVE Final    Comment: (NOTE) SARS-CoV-2 target nucleic acids are NOT DETECTED.  The SARS-CoV-2 RNA is generally detectable in upper and lower respiratory specimens during the acute phase of infection. The lowest concentration of SARS-CoV-2 viral copies this assay can detect is 250 copies / mL. A negative result does not preclude SARS-CoV-2 infection and should not be used as the sole basis for treatment or other patient management decisions.  A negative result may occur  with improper specimen collection / handling, submission of specimen other than nasopharyngeal swab, presence of viral mutation(s) within the areas targeted by this assay, and inadequate number of viral copies (<250 copies / mL). A negative result must be combined with clinical observations, patient history, and epidemiological information.  Fact Sheet for Patients:   StrictlyIdeas.no  Fact Sheet for Healthcare Providers: BankingDealers.co.za  This test is not yet approved or  cleared by the [place] FDA and has been authorized for detection and/or diagnosis of SARS-CoV-2 by FDA under an Emergency Use Authorization (EUA).  This EUA will remain in effect (meaning this test can be used) for the duration of the COVID-19 declaration under Section 564(b)(1) of the Act, 21 U.S.C. section 360bbb-3(b)(1), unless the authorization is terminated or revoked sooner.  Performed at [facility], [address] [place], [place] [address]      Total time spend on discharging this patient, including the last patient exam, discussing the hospital stay, instructions for ongoing care as it relates to all pertinent caregivers, as well as preparing the medical discharge records, prescriptions, and/or referrals as applicable, is 60 minutes.    Enzo Bi, MD  [facility]  10/02/2019, 1:57 PM  If 7PM-7AM, please contact night-coverage

## 2019-10-03 ENCOUNTER — Telehealth: Payer: Self-pay | Admitting: Family

## 2019-10-03 NOTE — Telephone Encounter (Signed)
Spoke with patient who is very weak but otherwise ok since discharge from the hospital. She is still checking her weight daily, taking medications as prescribed, following a low sodium diet and staying as active as she can. She has no symptoms at this time and confirmed her follow up appointment with [place] on 8/30.    Catarino Vold, NT

## 2019-10-06 ENCOUNTER — Telehealth (HOSPITAL_COMMUNITY): Payer: Self-pay

## 2019-10-06 NOTE — Telephone Encounter (Signed)
Attempted to contact.  Left message.  Will attempt to contact again in a few days.   [place] [phone]

## 2019-10-14 ENCOUNTER — Ambulatory Visit: Payer: Medicare Other | Admitting: Physician Assistant

## 2019-10-20 ENCOUNTER — Other Ambulatory Visit (HOSPITAL_COMMUNITY): Payer: Self-pay

## 2019-10-20 ENCOUNTER — Encounter (HOSPITAL_COMMUNITY): Payer: Self-pay

## 2019-10-20 ENCOUNTER — Telehealth: Payer: Self-pay | Admitting: Cardiovascular Disease

## 2019-10-20 NOTE — Telephone Encounter (Signed)
Kristi with EMS services called in to let us know that patient is up 10 pounds with weight of 225 today. In review of chart I see that at another visit at [facility] they had the following documented: Wt Readings from Last 5 Encounters:  09/04/19 (!) 101.6 kg (224 lb)  08/29/19 (!) 101.6 kg (224 lb)  08/28/19 98.9 kg (218 lb)  08/21/19 98.9 kg (218 lb)  07/11/19 98.9 kg (218 lb)   Patient has only been taking Furosemide 40 mg once a day. Based on instructions she is allowed to take Furosemide 40 mg twice a day if swelling. She has done this twice daily off and on but not recently. Patient denies any shortness of breath, no chest pain, but does have some 2+ pitting edema. Appointment moved up to see provider here in office and will route message to him for any recommendations prior to her appointment.

## 2019-10-20 NOTE — Progress Notes (Signed)
Today had a home visit with Karen [name].  She states she is building up fluid and if continues she will be at the hospital in about a week.  She has plus 2 edema in her legs and swelling around ankles and feet.  Lungs are diminished on right and clear on left.  She is on oxygen at all times.  She states she is up 10 lbs since discharge approx 3 weeks ago.  She tries to watch high sodium foods and we discussed food she has been eating and making sure she stays below 2000 mg sodium a day.  We added hers up yesterday and it was below.  She watches her fluid and stays around 60 ozs a day.  Her sugars has been up also since discharge, in the morning has been in 300's, she adjusts her insulin during the day to get it down.  She weighs daily.  She does not add any salt to her foods.  She states urinating a lot and even into the night.  She has took extra furosemide in past week with no results.  She has all her medications and verified that she knows how to take them.  She denies any chest pain, headaches, dizziness or more than her normal shortness of breath.  Contacted Karen [name] office and advised of weight gain, per Karen [name] she spoke to Karen [name] and advised for her to take her extra furosemide today and they have moved her appt up to this week.  Karen [name] is aware of taking extra 2 furosemide this evening and change in her appt.  Will contact her tomorrow to check weight.  Advised her to elevate her feet when sitting.  Watch high sodium foods and fluids closely.  Will continue to visit for heart failure, medication compliance and diet.   [facility] [phone]

## 2019-10-21 ENCOUNTER — Telehealth (HOSPITAL_COMMUNITY): Payer: Self-pay

## 2019-10-21 NOTE — Telephone Encounter (Signed)
When her weight goes above 212 pounds she needs Lasix 40 twice daily As previously discussed on prior office visit she is to take extra Lasix in the afternoon 40 mg for any leg swelling in addition to her 40 in the morning Once the leg edema resolves go back to 40 daily Take potassium with every Lasix 40 Extra potassium if taking Lasix in the afternoon For the next week would take Lasix 40 twice daily with potassium twice a day until leg edema improves

## 2019-10-21 NOTE — Telephone Encounter (Signed)
Contacted Karen [name] and she advised her weight is still 225 lbs.  She denies shortness of breath more than her normal, denies any chest pain.  She took extra 40 mg furosemide yesterday evening with extra potassium.  Per Dr Gwenyth Ober note explained to her to continue to take the 40 mg furosemide every afternoon with extra potassium until weight of 212 lbs and swelling goes down.  She has appt with Dr Rockey Situ this Friday.  She repeated it back to me and advised she will take it til her appt.    [place] [phone]

## 2019-10-23 ENCOUNTER — Telehealth (HOSPITAL_COMMUNITY): Payer: Self-pay

## 2019-10-23 DIAGNOSIS — M25552 Pain in left hip: Secondary | ICD-10-CM | POA: Insufficient documentation

## 2019-10-23 DIAGNOSIS — M48062 Spinal stenosis, lumbar region with neurogenic claudication: Secondary | ICD-10-CM | POA: Insufficient documentation

## 2019-10-23 NOTE — Progress Notes (Signed)
Date:  10/24/2019   ID:  Karen [name], DOB 1945/09/13, MRN [id number]  Patient Location:  [place] [id number]   Provider location:   [facility], [place] office  PCP:  Ricardo Jericho, NP  Cardiologist:  Arvid Right [facility]   Chief Complaint  Patient presents with  . Other    Patient c/o 10 Pound weight gain. meds reviewed verbally with patient.     History of Present Illness:    Karen [name] is a 74 y.o. 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 She presents for routine follow-up of her atrial fibrillation and heart failure  Recent phone calls concerning weight gain, leg swelling We recommended she stay on Lasix 40 daily with extra 40 in the afternoon for any leg edema or weight gain I recommend she take potassium with IV Lasix 40  Weight at home was measuring up to 225 pounds beginning of July Is estimated to dry weight is 210 pounds up to 215 pounds On a prior clinic visit weight was 224 in May 2021  High fluid intake, 4+ bottles of water a day among other fluids Takes potassium 2  A day Lasix 40 BID On this regimen weight has trended up now again 224 pounds Feels swelling in her legs, abdomen, shortness of breath She is on oxygen by nasal cannula on today's visit Very sedentary, legs weak Presents today in a wheelchair  Continue problems with hyponatremia, mild renal disease  EKG personally reviewed by myself on todays visit Normal sinus rhythm rate 83 bpm APCs   Other past medical history reviewed In the hospital 06/2019,  Hyponatremia:Na 127, up to 133 with fluids Rectal bleeding:suspected this is diverticular bleed. Hgb  10.9 -->10.0 -->9.3--8.7. Colonoscopy 08/2017 - Diverticulosis in the descending colon, in the transverse colon and in the ascending colon. A1c8.4,   Junctional bradycardia on arrival to the hospital with hypotension Markedly elevated right heart pressures on echocardiogram,severe pulmonary HTN,  BNP was 800  Treated with Lasix IV twice daily Poor renal function on arrival, suspected cardiorenal syndrome Improvement of renal function with diuresis Improvement of her abdominal bloating, cough improved, leg swelling improved   Prior CV studies:   The following studies were reviewed today:    Past Medical History:  Diagnosis Date  . (HFpEF) heart failure with preserved ejection fraction ([place]) 2017   a. 2017 Echo: EF 50%; b. 06/2018 Echo: EF 50-55%; c. 08/2018 Echo: EF 50-55%, Nl RV fxn. RVSP 60.59mmHg. Mild BAE. Mild to mod TR.     Marland Kitchen Acute on chronic respiratory failure with hypoxia and hypercapnia ([place]) 01/07/2015  . Anemia   . 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 Asthma   . Cataract   . CHF (congestive heart failure) ([place])   . CKD (chronic kidney disease), stage III   . COPD (chronic obstructive pulmonary disease) ([place])    (1) 06/2018 tobacco use, home 3L oxygen   . Diabetes mellitus without complication (HCC)    (1) A1C 7.7 (06/2018)  . Edema, peripheral 04/20/2014  . GI bleed 06/28/2019  . History of kidney stones   . Hyperlipidemia   . Hypertension   . Iron deficiency anemia 06/22/2014  . Junctional  bradycardia    a. In setting of beta blocker therapy.  . Leucocytosis 10/19/2015  . Morbid obesity ([place])   . Overactive bladder   . Primary osteoarthritis of right knee 09/01/2016  . Sciatica 01/07/2015   Past Surgical History:  Procedure Laterality Date  . APPENDECTOMY    . CESAREAN SECTION     x3  . CHOLECYSTECTOMY    . COLONOSCOPY WITH PROPOFOL N/A 08/28/2017   Procedure: COLONOSCOPY WITH PROPOFOL;  Surgeon: Lucilla Lame, MD;  Location: [facility] ENDOSCOPY;  Service: Endoscopy;   Laterality: N/A;  . COLONOSCOPY WITH PROPOFOL N/A 08/29/2017   Procedure: COLONOSCOPY WITH PROPOFOL;  Surgeon: Lucilla Lame, MD;  Location: [facility] ENDOSCOPY;  Service: Endoscopy;  Laterality: N/A;  . CYSTOSCOPY W/ URETERAL STENT PLACEMENT Right 09/15/2017   Procedure: CYSTOSCOPY WITH RETROGRADE PYELOGRAM/URETERAL STENT PLACEMENT;  Surgeon: Cleon Gustin, MD;  Location: [facility] ORS;  Service: Urology;  Laterality: Right;  . CYSTOSCOPY/URETEROSCOPY/HOLMIUM LASER/STENT PLACEMENT Right 10/09/2017   Procedure: CYSTOSCOPY/URETEROSCOPY/HOLMIUM LASER/STENT PLACEMENT;  Surgeon: Abbie Sons, MD;  Location: [facility] ORS;  Service: Urology;  Laterality: Right;  right Stent exchange  . ESOPHAGOGASTRODUODENOSCOPY (EGD) WITH PROPOFOL N/A 09/16/2018   Procedure: ESOPHAGOGASTRODUODENOSCOPY (EGD) WITH PROPOFOL;  Surgeon: Lin Landsman, MD;  Location: [place];  Service: Gastroenterology;  Laterality: N/A;  . EYE SURGERY       Current Meds  Medication Sig  . acetaminophen (TYLENOL) 500 MG tablet Take 500-1,000 mg by mouth every 6 (six) hours as needed for mild pain or fever.  Marland Kitchen albuterol (VENTOLIN HFA) 108 (90 Base) MCG/ACT inhaler Inhale 2 puffs into the lungs every 6 (six) hours as needed for wheezing or shortness of breath.   Marland Kitchen apixaban (ELIQUIS) 5 MG TABS tablet Take 1 tablet (5 mg total) by mouth 2 (two) times daily.  Marland Kitchen atorvastatin (LIPITOR) 10 MG tablet Take 10 mg by mouth daily.   . canagliflozin (INVOKANA) 100 MG TABS tablet Take 100 mg by mouth daily.  Marland Kitchen esomeprazole (NEXIUM) 40 MG capsule Take 40 mg by mouth daily.  . Ferrous Sulfate (IRON) 325 (65 Fe) MG TABS Take 1 tablet by mouth daily.  . fluticasone (FLONASE) 50 MCG/ACT nasal spray Place 2 sprays into both nostrils daily.  . Fluticasone-Umeclidin-Vilant 100-62.5-25 MCG/INH AEPB Inhale 1 puff into the lungs daily.  . insulin lispro (HUMALOG) 100 UNIT/ML injection Inject 0-76 Units into the skin continuous. (via V-Go disposable insulin  pump)  . ipratropium-albuterol (DUONEB) 0.5-2.5 (3) MG/3ML SOLN Inhale 3 mLs into the lungs 4 (four) times daily as needed (shortness of breath or wheezing).   Marland Kitchen losartan-hydrochlorothiazide (HYZAAR) 100-25 MG tablet Take 1 tablet by mouth daily.  . metFORMIN (GLUCOPHAGE) 500 MG tablet Take 1,000 mg by mouth 2 (two) times daily with a meal.   . montelukast (SINGULAIR) 10 MG tablet Take 10 mg by mouth at bedtime.  . potassium chloride (KLOR-CON) 10 MEQ tablet Take 1 tablet (10 mEq total) by mouth as directed. Take one tablet daily and when you take extra furosemide take extra tablet  . vitamin B-12 (CYANOCOBALAMIN) 500 MCG tablet Take 500 mcg by mouth daily.     Allergies:   Ace inhibitors, Beta adrenergic blockers, Gabapentin, Lisinopril, Lyrica [pregabalin], and Shrimp [shellfish allergy]   Social History   Tobacco Use  . Smoking status: Former Smoker    Packs/day: 1.00    Years: 20.00    Pack years: 20.00    Quit date: 12/04/1992    Years since quitting: 26.9  . Smokeless tobacco: Never  Used  Vaping Use  . Vaping Use: Never used  Substance Use Topics  . Alcohol use: No  . Drug use: No     Current Outpatient Medications on File Prior to Visit  Medication Sig Dispense Refill  . acetaminophen (TYLENOL) 500 MG tablet Take 500-1,000 mg by mouth every 6 (six) hours as needed for mild pain or fever.    Marland Kitchen albuterol (VENTOLIN HFA) 108 (90 Base) MCG/ACT inhaler Inhale 2 puffs into the lungs every 6 (six) hours as needed for wheezing or shortness of breath.     Marland Kitchen apixaban (ELIQUIS) 5 MG TABS tablet Take 1 tablet (5 mg total) by mouth 2 (two) times daily. 60 tablet 11  . atorvastatin (LIPITOR) 10 MG tablet Take 10 mg by mouth daily.     . canagliflozin (INVOKANA) 100 MG TABS tablet Take 100 mg by mouth daily.    Marland Kitchen esomeprazole (NEXIUM) 40 MG capsule Take 40 mg by mouth daily.    . Ferrous Sulfate (IRON) 325 (65 Fe) MG TABS Take 1 tablet by mouth daily.  11  . fluticasone (FLONASE) 50  MCG/ACT nasal spray Place 2 sprays into both nostrils daily.    . Fluticasone-Umeclidin-Vilant 100-62.5-25 MCG/INH AEPB Inhale 1 puff into the lungs daily.    . insulin lispro (HUMALOG) 100 UNIT/ML injection Inject 0-76 Units into the skin continuous. (via V-Go disposable insulin pump)    . ipratropium-albuterol (DUONEB) 0.5-2.5 (3) MG/3ML SOLN Inhale 3 mLs into the lungs 4 (four) times daily as needed (shortness of breath or wheezing).     Marland Kitchen losartan-hydrochlorothiazide (HYZAAR) 100-25 MG tablet Take 1 tablet by mouth daily.    . metFORMIN (GLUCOPHAGE) 500 MG tablet Take 1,000 mg by mouth 2 (two) times daily with a meal.     . montelukast (SINGULAIR) 10 MG tablet Take 10 mg by mouth at bedtime.    . potassium chloride (KLOR-CON) 10 MEQ tablet Take 1 tablet (10 mEq total) by mouth as directed. Take one tablet daily and when you take extra furosemide take extra tablet 90 tablet 3  . vitamin B-12 (CYANOCOBALAMIN) 500 MCG tablet Take 500 mcg by mouth daily.    . furosemide (LASIX) 20 MG tablet Take 2 tablets (40 mg total) by mouth as directed. Take 2 tablets (40 mg) in the AM and extra 1-2 tablets (20-40 mg) in the afternoon for leg swelling or shortness of breath 360 tablet 3   No current facility-administered medications on file prior to visit.     Family Hx: The patient's family history includes Other in her father and mother.  ROS:   Please see the history of present illness.    Review of Systems  Constitutional: Negative.   HENT: Negative.   Respiratory: Positive for shortness of breath.   Cardiovascular: Positive for leg swelling.  Gastrointestinal: Negative.   Musculoskeletal: Negative.   Neurological: Negative.   Psychiatric/Behavioral: Negative.   All other systems reviewed and are negative.     Labs/Other Tests and Data Reviewed:    Recent Labs: 06/29/2019: TSH 1.440 09/27/2019: ALT 11; B Natriuretic Peptide 88.5 10/02/2019: BUN 32; Creatinine, Ser 1.03; Hemoglobin 10.3;  Magnesium 1.8; Platelets 302; Potassium 4.0; Sodium 132   Recent Lipid Panel No results found for: CHOL, TRIG, HDL, CHOLHDL, LDLCALC, LDLDIRECT  Wt Readings from Last 3 Encounters:  10/24/19 224 lb (101.6 kg)  10/20/19 225 lb (102.1 kg)  10/02/19 (!) 213 lb 3 oz (96.7 kg)     Exam:    BP Marland Kitchen)  142/60 (BP Location: Right Arm, Patient Position: Sitting, Cuff Size: Normal)   Pulse 83   Ht 5\' 3"  (1.6 m)   Wt 224 lb (101.6 kg)   BMI 39.68 kg/m   Constitutional:  oriented to person, place, and time. No distress.  HENT:  Head: Grossly normal Eyes:  no discharge. No scleral icterus.  Neck: Unable to estimate JVD, no carotid bruits  Cardiovascular: Regular rate and rhythm, no murmurs appreciated Trace pitting lower extremity edema Pulmonary/Chest: Clear to auscultation bilaterally,  Scattered Rales at the bases Abdominal: Soft.  no distension.  no tenderness.  Musculoskeletal: Normal range of motion Neurological:  normal muscle tone. Coordination normal. No atrophy Skin: Skin warm and dry Psychiatric: normal affect, pleasant   ASSESSMENT & PLAN:    Chronic diastolic heart failure (HCC) - Weight continues to run up now 224 pounds, goal weight 210 up to 15 Recommend she hold the Lasix, changed to torsemide 40 twice daily alternating with 40 daily With every torsemide 40 would take a potassium pill Follow-up in clinic in 3 weeks time with CMP at that time She has had problems with chronic hyponatremia She does have high fluid intake, discussed with her that she needs to moderate her diet and fluids  Essential hypertension - Monitor blood pressure at home, no changes made  Type 2 diabetes mellitus without complication, with long-term current use of insulin ([place]) - Long history of poor control diabetes, Weight is markedly above her goal weight, dietary modification recommended  Junctional bradycardia - All beta-blockers, calcium channel blockers held, rate stable Normal sinus  rhythm today, stable    Total encounter time more than 25 minutes  Greater than 50% was spent in counseling and coordination of care with the patient   Disposition: Follow-up in 3 months   Signed, Ida Rogue, MD  10/24/2019 3:10 PM    [place] [facility] [id number], [place], [place] [address]

## 2019-10-23 NOTE — Telephone Encounter (Signed)
Today contacted Karen [name] to check on her weight and how she is feeling.  She states her weight is still 225 lbs, she is still feeling the same.  She is aware of cardiology appt tomorrow.    [place] [phone]

## 2019-10-24 ENCOUNTER — Other Ambulatory Visit: Payer: Self-pay | Admitting: Physical Medicine & Rehabilitation

## 2019-10-24 ENCOUNTER — Ambulatory Visit (INDEPENDENT_AMBULATORY_CARE_PROVIDER_SITE_OTHER): Payer: Medicare Other | Admitting: Cardiovascular Disease

## 2019-10-24 ENCOUNTER — Other Ambulatory Visit: Payer: Self-pay

## 2019-10-24 ENCOUNTER — Ambulatory Visit: Admission: RE | Admit: 2019-10-24 | Payer: Medicare Other | Source: Ambulatory Visit

## 2019-10-24 ENCOUNTER — Encounter: Payer: Self-pay | Admitting: Cardiovascular Disease

## 2019-10-24 ENCOUNTER — Other Ambulatory Visit (HOSPITAL_COMMUNITY): Payer: Self-pay | Admitting: Physical Medicine & Rehabilitation

## 2019-10-24 VITALS — BP 142/60 | HR 83 | Ht 63.0 in | Wt 224.0 lb

## 2019-10-24 DIAGNOSIS — J449 Chronic obstructive pulmonary disease, unspecified: Secondary | ICD-10-CM | POA: Diagnosis not present

## 2019-10-24 DIAGNOSIS — I5032 Chronic diastolic (congestive) heart failure: Secondary | ICD-10-CM | POA: Diagnosis not present

## 2019-10-24 DIAGNOSIS — R001 Bradycardia, unspecified: Secondary | ICD-10-CM | POA: Diagnosis not present

## 2019-10-24 DIAGNOSIS — E782 Mixed hyperlipidemia: Secondary | ICD-10-CM

## 2019-10-24 DIAGNOSIS — I480 Paroxysmal atrial fibrillation: Secondary | ICD-10-CM

## 2019-10-24 DIAGNOSIS — E119 Type 2 diabetes mellitus without complications: Secondary | ICD-10-CM | POA: Diagnosis not present

## 2019-10-24 DIAGNOSIS — I10 Essential (primary) hypertension: Secondary | ICD-10-CM

## 2019-10-24 DIAGNOSIS — M25552 Pain in left hip: Secondary | ICD-10-CM

## 2019-10-24 MED ORDER — POTASSIUM CHLORIDE CRYS ER 10 MEQ PO TBCR: EXTENDED_RELEASE_TABLET | ORAL | 3 refills | Status: DC

## 2019-10-24 MED ORDER — TORSEMIDE 20 MG PO TABS
ORAL_TABLET | ORAL | 3 refills | Status: DC
Start: 2019-10-24 — End: 2020-05-03

## 2019-10-24 NOTE — Patient Instructions (Addendum)
Medication Instructions:  1) Stop the lasix (furosemide) once you get the torsemide   2) Start torsemide 20 mg:  For weight 216 or higher - take 2 tablets (40 mg) in the AM and 2 tablets (40 mg) at 2 pm (with two potassium) every other day  alternating with 2 tablets (40 mg) once every other day (with one potassium)  When weight gets to 215 pounds or less - take 2 tablets (40 mg) once a day (with one potassium)  If you need a refill on your cardiac medications before your next appointment, please call your pharmacy.    Lab work: No new labs needed   If you have labs (blood work) drawn today and your tests are completely normal, you will receive your results only by: Marland Kitchen MyChart Message (if you have MyChart) OR . A paper copy in the mail If you have any lab test that is abnormal or we need to change your treatment, we will call you to review the results.   Testing/Procedures: No new testing needed   Follow-Up: At [facility], you and your health needs are our priority.  As part of our continuing mission to provide you with exceptional heart care, we have created designated Provider Care Teams.  These Care Teams include your primary Cardiologist (physician) and Advanced Practice Providers (APPs -  Physician Assistants and Nurse Practitioners) who all work together to provide you with the care you need, when you need it.  . You will need a follow up appointment in 3 weeks   . Providers on your designated Care Team:   . Murray Hodgkins, NP . Christell Faith, PA-C . Marrianne Mood, PA-C  Any Other Special Instructions Will Be Listed Below (If Applicable).  COVID-19 Vaccine Information can be found at: ShippingScam.co.uk For questions related to vaccine distribution or appointments, please email [email or web address] or call [phone].

## 2019-10-27 ENCOUNTER — Ambulatory Visit: Payer: Medicare Other | Admitting: Cardiovascular Disease

## 2019-10-27 ENCOUNTER — Ambulatory Visit: Payer: Medicare Other | Admitting: Physician Assistant

## 2019-10-29 ENCOUNTER — Ambulatory Visit: Payer: Medicare Other | Admitting: Cardiovascular Disease

## 2019-11-03 ENCOUNTER — Ambulatory Visit: Payer: Medicare Other | Admitting: Family

## 2019-11-11 ENCOUNTER — Other Ambulatory Visit: Payer: Self-pay

## 2019-11-11 ENCOUNTER — Ambulatory Visit
Admission: RE | Admit: 2019-11-11 | Discharge: 2019-11-11 | Disposition: A | Discharge status: Home / Self Care | Payer: Medicare Other | Source: Ambulatory Visit | Attending: Physical Medicine & Rehabilitation | Admitting: Physical Medicine & Rehabilitation

## 2019-11-11 DIAGNOSIS — M25552 Pain in left hip: Secondary | ICD-10-CM | POA: Insufficient documentation

## 2019-11-12 ENCOUNTER — Ambulatory Visit: Payer: Medicare Other

## 2019-11-19 ENCOUNTER — Ambulatory Visit: Payer: Medicare Other | Admitting: Family

## 2019-11-19 NOTE — Progress Notes (Deleted)
Office Visit    Patient Name: Karen [name] Date of Encounter: 11/19/2019  Primary Care Provider:  Ricardo Jericho, NP Primary Cardiologist:  Ida Rogue, MD Electrophysiologist:  None   Chief Complaint    Karen [name] is a 74 y.o. female with a hx of  HFpEF, junctional bradycardia, CKD 3, GI bleed, DM2, HTN, HLD, chronic hypoxic respiratory failure secondary to COPD on home oxygen, anemia, obesity presents today for HF follow up.   Past Medical History    Past Medical History:  Diagnosis Date  . (HFpEF) heart failure with preserved ejection fraction ([place]) 2017   a. 2017 Echo: EF 50%; b. 06/2018 Echo: EF 50-55%; c. 08/2018 Echo: EF 50-55%, Nl RV fxn. RVSP 60.23mmHg. Mild BAE. Mild to mod TR.     Marland Kitchen Acute on chronic respiratory failure with hypoxia and hypercapnia ([place]) 01/07/2015  . Anemia   . 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 Asthma   . Cataract   . CHF (congestive heart failure) ([place])   . CKD (chronic kidney disease), stage III   . COPD (chronic obstructive pulmonary disease) ([place])    (1) 06/2018 tobacco use, home 3L oxygen   . Diabetes mellitus without complication (HCC)    (1) A1C 7.7 (06/2018)  . Edema, peripheral 04/20/2014  . GI bleed 06/28/2019  . History of kidney stones   . Hyperlipidemia   . Hypertension   . Iron deficiency anemia 06/22/2014  . Junctional bradycardia    a. In setting of beta blocker therapy.  . Leucocytosis 10/19/2015  . Morbid obesity ([place])   . Overactive bladder   . Primary osteoarthritis of right knee 09/01/2016  . Sciatica 01/07/2015   Past Surgical History:  Procedure Laterality Date  . APPENDECTOMY    . CESAREAN SECTION     x3  . CHOLECYSTECTOMY    . COLONOSCOPY WITH PROPOFOL N/A 08/28/2017   Procedure: COLONOSCOPY WITH PROPOFOL;  Surgeon: Lucilla Lame, MD;  Location: [facility] ENDOSCOPY;  Service: Endoscopy;  Laterality: N/A;  . COLONOSCOPY WITH PROPOFOL N/A 08/29/2017   Procedure: COLONOSCOPY WITH PROPOFOL;  Surgeon: Lucilla Lame, MD;   Location: [facility] ENDOSCOPY;  Service: Endoscopy;  Laterality: N/A;  . CYSTOSCOPY W/ URETERAL STENT PLACEMENT Right 09/15/2017   Procedure: CYSTOSCOPY WITH RETROGRADE PYELOGRAM/URETERAL STENT PLACEMENT;  Surgeon: Cleon Gustin, MD;  Location: [facility] ORS;  Service: Urology;  Laterality: Right;  . CYSTOSCOPY/URETEROSCOPY/HOLMIUM LASER/STENT PLACEMENT Right 10/09/2017   Procedure: CYSTOSCOPY/URETEROSCOPY/HOLMIUM LASER/STENT PLACEMENT;  Surgeon: Abbie Sons, MD;  Location: [facility] ORS;  Service: Urology;  Laterality: Right;  right Stent exchange  . ESOPHAGOGASTRODUODENOSCOPY (EGD) WITH PROPOFOL N/A 09/16/2018   Procedure: ESOPHAGOGASTRODUODENOSCOPY (EGD) WITH PROPOFOL;  Surgeon: Lin [place]man, MD;  Location: [facility];  Service: Gastroenterology;  Laterality: N/A;  . EYE SURGERY      Allergies  Allergies  Allergen Reactions  . Ace Inhibitors Hives  . Beta Adrenergic Blockers     Junctional bradycardia  . Gabapentin Hives  . Lisinopril Hives  . Lyrica [Pregabalin] Hives  . Shrimp [Shellfish Allergy] Swelling    Swelling of the lips    History of Present Illness    Karen [name] is a 74 y.o. female with a hx of  HFpEF, junctional bradycardia, CKD 3, GI bleed, DM2, HTN, HLD, chronic hypoxic respiratory failure secondary to COPD on home oxygen, anemia, obesity. She was last seen ***.  Echo 06/2018 in succession angina normal LV SF with EF 50 to 55%.  Admitted 6-20 20 with junctional bradycardia  and hypotension.  Bisoprolol was stopped.  Required a brief course of dopamine with subsequent stabilization and discharge without recommendation for PPM.  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  Required 1 unit PRBC.  Admitted 02/13/19-02/16/19 with junctional bradycardia.  Echo LVEF 65 to 70%, no R WMA, trivial MR/TR/PR, moderately elevated PASP.  Noted still taking bisoprolol  despite medication have been discontinued.  Volume up and improved with IV Lasix, diuresis of 9 L.  It was discovered she had picked up a noncancelled prescription at [facility].   Return to [facility] 02/23/2019 with 2-day history of worsening SOB, orthopnea, cough.  She was fairly certain she again restarted bisoprolol.  EKG junctional bradycardia 39 bpm without acute ST/T wave changes.  She was placed on IV Lasix and diuresed.  A ZIO monitor was placed prior to discharge on 02/27/19. This showed NSR with PAF, she was recommended to start Eliquis.       EKGs/Labs/Other Studies Reviewed:   The following studies were reviewed today: Echo 09/28/19  1. Left ventricular ejection fraction, by estimation, is 55 to 60%. The  left ventricle has normal function. The left ventricle has no regional  wall motion abnormalities. There is mild left ventricular hypertrophy.  Left ventricular diastolic parameters  are consistent with Grade I diastolic dysfunction (impaired relaxation).   2. Right ventricular systolic function is normal. The right ventricular  size is normal. There is severely elevated pulmonary artery systolic  pressure. The estimated right ventricular systolic pressure is 30.1 mmHg.   3. Left atrial size was mildly dilated.   4. The mitral valve is normal in structure. Trivial mitral valve  regurgitation. No evidence of mitral stenosis.   5. The aortic valve is abnormal. Aortic valve regurgitation is not  visualized. Moderate aortic valve stenosis. Aortic valve area, by VTI  measures 0.94 cm. Aortic valve mean gradient measures 17.3 mmHg.   1. Left ventricular ejection fraction, by estimation, is 55 to 60%. The  left ventricle has normal function. The left ventricle has no regional  wall motion abnormalities. There is mild left ventricular hypertrophy.  Left ventricular diastolic parameters  are consistent with Grade I diastolic dysfunction (impaired relaxation).   2. Right ventricular  systolic function is normal. The right ventricular  size is normal. There is severely elevated pulmonary artery systolic  pressure. The estimated right ventricular systolic pressure is 60.1 mmHg.   3. Left atrial size was mildly dilated.   4. The mitral valve is normal in structure. Trivial mitral valve  regurgitation. No evidence of mitral stenosis.   5. The aortic valve is abnormal. Aortic valve regurgitation is not  visualized. Moderate aortic valve stenosis. Aortic valve area, by VTI  measures 0.94 cm. Aortic valve mean gradient measures 17.3 mmHg.   Monitor 03/10/19 Normal sinus rhythm with paroxysmal atrial fibrillation   avg HR of 89 bpm.  1 run of Ventricular Tachycardia occurred lasting 4 beats with a max rate of 179 bpm (avg 161 bpm).    Atrial Fibrillation occurred (<1% burden), ranging from 75-179 bpm (avg of 117 bpm), the longest lasting 32 mins 50 secs with an avg rate of 119 bpm.    Isolated SVEs were occasional (1.4%, 25270), SVE Couplets were rare (<1.0%, 2710), and SVE Triplets were rare (<1.0%, 299). Isolated VEs were rare (<1.0%, 617), VE Couplets were rare (<1.0%, 4), and VE Triplets were rare (<1.0%, 1). Ventricular Bigeminy was present.  Echo 02/14/20  1. Left ventricular ejection fraction, by visual estimation, is 65 to 70%. The left ventricle has normal function. Left ventricular septal wall thickness was normal. Normal left ventricular posterior wall thickness. There is no left ventricular  hypertrophy.  2. The left ventricle has no regional wall motion abnormalities.  3. Global right ventricle has normal systolic function.The right ventricular size is normal. No increase in right ventricular wall thickness.  4. Left atrial size was normal.  5. Right atrial size was normal.  6. The mitral valve is grossly normal. Trivial mitral valve regurgitation.  7. The tricuspid valve is grossly normal. Tricuspid valve regurgitation is trivial.  8. The aortic valve was not  well visualized. Aortic valve regurgitation is not visualized.  9. The pulmonic valve was not well visualized. Pulmonic valve regurgitation is trivial. 10. The aortic root was not well visualized. 11. Moderately elevated pulmonary artery systolic pressure. 12. The atrial septum is grossly normal.    EKG:  EKG is ordered today.  The ekg ordered today demonstrates ***  Recent Labs: 06/29/2019: TSH 1.440 09/27/2019: ALT 11; B Natriuretic Peptide 88.5 10/02/2019: BUN 32; Creatinine, Ser 1.03; Hemoglobin 10.3; Magnesium 1.8; Platelets 302; Potassium 4.0; Sodium 132  Recent Lipid Panel No results found for: CHOL, TRIG, HDL, CHOLHDL, VLDL, LDLCALC, LDLDIRECT  Home Medications   No outpatient medications have been marked as taking for the 11/19/19 encounter (Appointment) with Loel Dubonnet, NP.      Review of Systems    ***   ROS All other systems reviewed and are otherwise negative except as noted above.  Physical Exam    VS:  There were no vitals taken for this visit. , BMI There is no height or weight on file to calculate BMI. GEN: Well nourished, well developed, in no acute distress. HEENT: normal. Neck: Supple, no JVD, carotid bruits, or masses. Cardiac: ***RRR, no murmurs, rubs, or gallops. No clubbing, cyanosis, edema.  ***Radials/DP/PT 2+ and equal bilaterally.  Respiratory:  ***Respirations regular and unlabored, clear to auscultation bilaterally. GI: Soft, nontender, nondistended, BS + x 4. MS: No deformity or atrophy. Skin: Warm and dry, no rash. Neuro:  Strength and sensation are intact. Psych: Normal affect.  Assessment & Plan    1. ***  Disposition: Follow up {follow up:15908} with Dr. Rockey Situ or APP.    Loel Dubonnet, NP 11/19/2019, 10:09 AM

## 2019-12-01 ENCOUNTER — Telehealth (HOSPITAL_COMMUNITY): Payer: Self-pay

## 2019-12-01 NOTE — Telephone Encounter (Signed)
Was contacted by Karen [name] and her husband today asking if she could get a foley catheter because they are exhausted.  She is getting up several times a night and it takes them both to get her to bathroom.  It takes about 30 to 45 minutes each time.  She is taking her fluid pills in the morning,  She states she urinates around the clock.  Contacted her Primary doctor and advised if there is any help she could get to help them get some sleep.  She states she is doing ok, has all her medications.  She tries to watch her high sodium foods.  HF clinic in [place] working on getting a new program to get Moms meals started back up.  She does well when she eats the Moms meals.  She denies any chest pain, headaches or shortness of breath.  She states her sugars has been ok.  Will continue to visit for Heart Failure.   [place] [phone]

## 2019-12-05 ENCOUNTER — Emergency Department: Payer: Medicare Other

## 2019-12-05 ENCOUNTER — Emergency Department
Admission: EM | Admit: 2019-12-05 | Discharge: 2019-12-05 | Disposition: A | Discharge status: Home / Self Care | Payer: Medicare Other | Attending: Emergency Medicine | Admitting: Emergency Medicine

## 2019-12-05 ENCOUNTER — Other Ambulatory Visit: Payer: Self-pay

## 2019-12-05 ENCOUNTER — Encounter: Payer: Self-pay | Admitting: Intensive Care

## 2019-12-05 DIAGNOSIS — J441 Chronic obstructive pulmonary disease with (acute) exacerbation: Secondary | ICD-10-CM | POA: Diagnosis not present

## 2019-12-05 DIAGNOSIS — E1122 Type 2 diabetes mellitus with diabetic chronic kidney disease: Secondary | ICD-10-CM | POA: Insufficient documentation

## 2019-12-05 DIAGNOSIS — E1140 Type 2 diabetes mellitus with diabetic neuropathy, unspecified: Secondary | ICD-10-CM | POA: Insufficient documentation

## 2019-12-05 DIAGNOSIS — Z79899 Other long term (current) drug therapy: Secondary | ICD-10-CM | POA: Insufficient documentation

## 2019-12-05 DIAGNOSIS — N390 Urinary tract infection, site not specified: Secondary | ICD-10-CM

## 2019-12-05 DIAGNOSIS — Z87891 Personal history of nicotine dependence: Secondary | ICD-10-CM | POA: Insufficient documentation

## 2019-12-05 DIAGNOSIS — R109 Unspecified abdominal pain: Secondary | ICD-10-CM | POA: Diagnosis present

## 2019-12-05 DIAGNOSIS — Z7901 Long term (current) use of anticoagulants: Secondary | ICD-10-CM | POA: Insufficient documentation

## 2019-12-05 DIAGNOSIS — I130 Hypertensive heart and chronic kidney disease with heart failure and stage 1 through stage 4 chronic kidney disease, or unspecified chronic kidney disease: Secondary | ICD-10-CM | POA: Insufficient documentation

## 2019-12-05 DIAGNOSIS — J45909 Unspecified asthma, uncomplicated: Secondary | ICD-10-CM | POA: Insufficient documentation

## 2019-12-05 DIAGNOSIS — N1831 Chronic kidney disease, stage 3a: Secondary | ICD-10-CM | POA: Insufficient documentation

## 2019-12-05 DIAGNOSIS — Z7984 Long term (current) use of oral hypoglycemic drugs: Secondary | ICD-10-CM | POA: Insufficient documentation

## 2019-12-05 DIAGNOSIS — Z794 Long term (current) use of insulin: Secondary | ICD-10-CM | POA: Insufficient documentation

## 2019-12-05 DIAGNOSIS — I5031 Acute diastolic (congestive) heart failure: Secondary | ICD-10-CM | POA: Diagnosis not present

## 2019-12-05 LAB — URINALYSIS, COMPLETE (UACMP) WITH MICROSCOPIC
Bilirubin Urine: NEGATIVE
Glucose, UA: NEGATIVE mg/dL
Hgb urine dipstick: NEGATIVE
Ketones, ur: NEGATIVE mg/dL
Nitrite: NEGATIVE
Protein, ur: NEGATIVE mg/dL
Specific Gravity, Urine: 1.017 (ref 1.005–1.030)
WBC, UA: >50 WBC/hpf — ABNORMAL HIGH (ref 0–5)
pH: 5 (ref 5.0–8.0)

## 2019-12-05 LAB — BASIC METABOLIC PANEL
Anion gap: 16 — ABNORMAL HIGH (ref 5–15)
BUN: 27 mg/dL — ABNORMAL HIGH (ref 8–23)
CO2: 30 mmol/L (ref 22–32)
Calcium: 9.4 mg/dL (ref 8.9–10.3)
Chloride: 86 mmol/L — ABNORMAL LOW (ref 98–111)
Creatinine, Ser: 0.94 mg/dL (ref 0.44–1.00)
GFR calc Af Amer: >60 mL/min (ref >60)
GFR calc non Af Amer: 59 mL/min — ABNORMAL LOW (ref >60)
Glucose, Bld: 78 mg/dL (ref 70–99)
Potassium: 3.6 mmol/L (ref 3.5–5.1)
Sodium: 132 mmol/L — ABNORMAL LOW (ref 135–145)

## 2019-12-05 LAB — CBC
HCT: 33.1 % — ABNORMAL LOW (ref 36.0–46.0)
Hemoglobin: 11.2 g/dL — ABNORMAL LOW (ref 12.0–15.0)
MCH: 27 pg (ref 26.0–34.0)
MCHC: 33.8 g/dL (ref 30.0–36.0)
MCV: 79.8 fL — ABNORMAL LOW (ref 80.0–100.0)
Platelets: 298 10*3/uL (ref 150–400)
RBC: 4.15 MIL/uL (ref 3.87–5.11)
RDW: 16.3 % — ABNORMAL HIGH (ref 11.5–15.5)
WBC: 11.8 10*3/uL — ABNORMAL HIGH (ref 4.0–10.5)
nRBC: 0 % (ref 0.0–0.2)

## 2019-12-05 LAB — BRAIN NATRIURETIC PEPTIDE: B Natriuretic Peptide: 84.6 pg/mL (ref 0.0–100.0)

## 2019-12-05 MED ORDER — CEFDINIR 300 MG PO CAPS
300.0000 mg | ORAL_CAPSULE | Freq: Two times a day (BID) | ORAL | 0 refills | Status: DC
Start: 2019-12-05 — End: 2020-05-25

## 2019-12-05 MED ORDER — LIDOCAINE HCL (PF) 1 % IJ SOLN
5.0000 mL | Freq: Once | INTRAMUSCULAR | Status: AC
  Administered 2019-12-05: 5 mL via INTRADERMAL
  Filled 2019-12-05: qty 5

## 2019-12-05 MED ORDER — SODIUM CHLORIDE 0.9 % IV SOLN: 1.0000 g | Freq: Once | INTRAVENOUS | Status: DC

## 2019-12-05 MED ORDER — CEFTRIAXONE SODIUM 1 G IJ SOLR
1.0000 g | Freq: Once | INTRAMUSCULAR | Status: AC
  Administered 2019-12-05: 1 g via INTRAMUSCULAR
  Filled 2019-12-05: qty 10

## 2019-12-05 NOTE — ED Triage Notes (Signed)
Patient c/o left sided flank pain with frequent urination a couple of days ago. Reports some pain in right side today. Reports she quit taking her lasix X2 days ago

## 2019-12-05 NOTE — ED Notes (Signed)
See triage note Presents with flank pain and some urinary freq  No fever  Positive dysuria

## 2019-12-05 NOTE — ED Provider Notes (Signed)
[facility] Emergency Department Provider Note  ____________________________________________   First MD Initiated Contact with Patient 12/05/19 1843     (approximate)  I have reviewed the triage vital signs and the nursing notes.   HISTORY  Chief Complaint Flank Pain and Urinary Frequency    HPI RETAL TONKINSON is a 74 y.o. female presents emergency department complaining of right-sided flank pain for approximately 2 days.  States that she quit taking her Lasix because she was urinating too much.  Now that she is stopped her Lasix she is continue to have a lot of urinary frequency.  No burning when she pees.  History of congestive heart failure.  No history of kidney stones.  She is complaining of bilateral flank pain more so on the right side.  No vomiting.  No fever.    Past Medical History:  Diagnosis Date   (HFpEF) heart failure with preserved ejection fraction ([place]) 2017   a. 2017 Echo: EF 50%; b. 06/2018 Echo: EF 50-55%; c. 08/2018 Echo: EF 50-55%, Nl RV fxn. RVSP 60.46mHg. Mild BAE. Mild to mod TR.      Acute on chronic respiratory failure with hypoxia and hypercapnia (HCC) 01/07/2015   Anemia    Asterixis 01/07/2015   Asthma    Cataract    CHF (congestive heart failure) (HCC)    CKD (chronic kidney disease), stage III (HCC)    COPD (chronic obstructive pulmonary disease) ([place]    (1) 06/2018 tobacco use, home 3L oxygen    Diabetes mellitus without complication (HCC)    (1) A1C 7.7 (06/2018)   Edema, peripheral 04/20/2014   GI bleed 06/28/2019   History of kidney stones    Hyperlipidemia    Hypertension    Iron deficiency anemia 06/22/2014   Junctional bradycardia    a. In setting of beta blocker therapy.   Leucocytosis 10/19/2015   Morbid obesity ([place]    Overactive bladder    Primary osteoarthritis of right knee 09/01/2016   Sciatica 01/07/2015    Patient Active Problem List   Diagnosis Date Noted   Acute hip pain,  left 10/23/2019   Lumbar stenosis with neurogenic claudication 10/23/2019   COPD with acute exacerbation ([facility] 087/57/9728  Acute diastolic CHF (congestive heart failure) ([place] 09/28/2019   (HFpEF) heart failure with preserved ejection fraction ([place] 09/27/2019   Rectal bleeding 06/28/2019   Hypokalemia 06/28/2019   Hyponatremia 06/28/2019   Type II diabetes mellitus with renal manifestations ([place] 06/28/2019   CKD (chronic kidney disease), stage IIIa 06/28/2019   Atrial fibrillation, chronic ([facility] 06/28/2019   Pulmonary edema 02/27/2019   Bradycardia 02/24/2019   Chronic respiratory failure with hypoxia ([facility] 02/23/2019   Atypical chest pain 02/13/2019   Osteopenia of neck of left femur 11/20/2018   AVM (arteriovenous malformation) of small bowel, acquired    Acute gastric ulcer with hemorrhage    Chronic diastolic heart failure ([place] 08/22/2018   Diarrhea 08/22/2018   Junctional bradycardia    Acute on chronic heart failure with preserved ejection fraction (HFpEF) ([place]    AKI (acute kidney injury) ([place]    Symptomatic bradycardia 08/05/2018   Acute on chronic respiratory failure ([place] 06/25/2018   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 diabetes mellitus ([facility] 01/25/2018   History of non anemic vitamin B12 deficiency 01/25/2018   Personal history of kidney stones 11/12/2017   Urge incontinence 11/12/2017   History of leukocytosis 09/17/2017   Right ureteral stone 09/15/2017   Acute GI bleeding  GI bleed 08/26/2017   Arthritis 08/10/2017   Chronic kidney disease 08/10/2017   COPD (chronic obstructive pulmonary disease) ([place]) 08/10/2017   Diabetes mellitus type 2, uncomplicated ([place]) 83/41/9622   Hypertension 08/10/2017   Obesity (BMI 35.0-39.9 without comorbidity) 04/11/2017   Primary osteoarthritis of right knee 09/01/2016   Leucocytosis 10/19/2015   Asterixis 01/07/2015   Acute on chronic respiratory failure with  hypoxia and hypercapnia (HCC) 01/07/2015   Sciatica 01/07/2015   Weakness 01/07/2015   Chronic 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 01/04/2015   Iron deficiency anemia 06/22/2014   Microalbuminuria 06/22/2014   CHF (congestive heart failure) ([place]) 04/20/2014   Edema, peripheral 04/20/2014    Past Surgical History:  Procedure Laterality Date   APPENDECTOMY     CESAREAN SECTION     x3   CHOLECYSTECTOMY     COLONOSCOPY WITH PROPOFOL N/A 08/28/2017   Procedure: COLONOSCOPY WITH PROPOFOL;  Surgeon: Lucilla Lame, MD;  Location: [facility] ENDOSCOPY;  Service: Endoscopy;  Laterality: N/A;   COLONOSCOPY WITH PROPOFOL N/A 08/29/2017   Procedure: COLONOSCOPY WITH PROPOFOL;  Surgeon: Lucilla Lame, MD;  Location: [facility] ENDOSCOPY;  Service: Endoscopy;  Laterality: N/A;   CYSTOSCOPY W/ URETERAL STENT PLACEMENT Right 09/15/2017   Procedure: CYSTOSCOPY WITH RETROGRADE PYELOGRAM/URETERAL STENT PLACEMENT;  Surgeon: Cleon Gustin, MD;  Location: [facility] ORS;  Service: Urology;  Laterality: Right;   CYSTOSCOPY/URETEROSCOPY/HOLMIUM LASER/STENT PLACEMENT Right 10/09/2017   Procedure: CYSTOSCOPY/URETEROSCOPY/HOLMIUM LASER/STENT PLACEMENT;  Surgeon: Abbie Sons, MD;  Location: [facility] ORS;  Service: Urology;  Laterality: Right;  right Stent exchange   ESOPHAGOGASTRODUODENOSCOPY (EGD) WITH PROPOFOL N/A 09/16/2018   Procedure: ESOPHAGOGASTRODUODENOSCOPY (EGD) WITH PROPOFOL;  Surgeon: Lin Landsman, MD;  Location: [facility];  Service: Gastroenterology;  Laterality: N/A;   EYE SURGERY      Prior to Admission medications   Medication Sig Start Date End Date Taking? Authorizing Provider  acetaminophen (TYLENOL) 500 MG tablet Take 500-1,000 mg by mouth every 6 (six) hours as needed for mild pain or fever.    [provider]  albuterol (VENTOLIN HFA) 108 (90 Base) MCG/ACT inhaler Inhale 2 puffs into the lungs every 6 (six) hours as needed for wheezing or shortness of breath.      [provider]  apixaban (ELIQUIS) 5 MG TABS tablet Take 1 tablet (5 mg total) by mouth 2 (two) times daily. 04/09/19   [name], MD  atorvastatin (LIPITOR) 10 MG tablet Take 10 mg by mouth daily.     [provider]  canagliflozin (INVOKANA) 100 MG TABS tablet Take 100 mg by mouth daily.    [provider]  cefdinir (OMNICEF) 300 MG capsule Take 1 capsule (300 mg total) by mouth 2 (two) times daily. 12/05/19   Leeyah Heather, Linden Dolin, PA-C  esomeprazole (NEXIUM) 40 MG capsule Take 40 mg by mouth daily. 12/17/18   [provider]  Ferrous Sulfate (IRON) 325 (65 Fe) MG TABS Take 1 tablet by mouth daily. 09/14/17   [provider]  fluticasone (FLONASE) 50 MCG/ACT nasal spray Place 2 sprays into both nostrils daily. 11/15/18   [provider]  Fluticasone-Umeclidin-Vilant 100-62.5-25 MCG/INH AEPB Inhale 1 puff into the lungs daily. 12/25/17   [provider]  Insulin Disposable Pump (V-GO 40) KIT USE VIA WEARABLE INJECTOR ONCE DAILY 11/01/19   [provider]  insulin lispro (HUMALOG) 100 UNIT/ML injection Inject 0-76 Units into the skin continuous. (via V-Go disposable insulin pump)    [provider]  ipratropium-albuterol (DUONEB) 0.5-2.5 (3)  MG/3ML SOLN Inhale 3 mLs into the lungs 4 (four) times daily as needed (shortness of breath or wheezing).     [provider]  losartan-hydrochlorothiazide (HYZAAR) 100-25 MG tablet Take 1 tablet by mouth daily. 01/05/19   [provider]  metFORMIN (GLUCOPHAGE) 500 MG tablet Take 1,000 mg by mouth 2 (two) times daily with a meal.     [provider]  montelukast (SINGULAIR) 10 MG tablet Take 10 mg by mouth at bedtime.    [provider]  potassium chloride (KLOR-CON) 10 MEQ tablet Take 1 tablet (10 meq) as directed when taking torsemide 10/24/19   [name], MD  torsemide (DEMADEX) 20 MG tablet Take 2 tablets (40 mg) twice daily as directed  based on weight 10/24/19   [name], MD  vitamin B-12 (CYANOCOBALAMIN) 500 MCG tablet Take 500 mcg by mouth daily.    [provider]    Allergies Ace inhibitors, Beta adrenergic blockers, Gabapentin, Lisinopril, Lyrica [pregabalin], and Shrimp [shellfish allergy]  Family History  Problem Relation Age of Onset   Other Mother        unknown medical history   Other Father        unknown medical history    Social History Social History   Tobacco Use   Smoking status: Former Smoker    Packs/day: 1.00    Years: 20.00    Pack years: 20.00    Quit date: 12/04/1992    Years since quitting: 27.0   Smokeless tobacco: Never Used  Vaping Use   Vaping Use: Never used  Substance Use Topics   Alcohol use: No   Drug use: No    Review of Systems  Constitutional: No fever/chills Eyes: No visual changes. ENT: No sore throat. Respiratory: Denies cough Cardiovascular: Denies chest pain Gastrointestinal: Positive for flank pain Genitourinary: Negative for dysuria.  Positive urinary frequency Musculoskeletal: Negative for back pain. Skin: Negative for rash. Psychiatric: no mood changes,     ____________________________________________   PHYSICAL EXAM:  VITAL SIGNS: ED Triage Vitals  Enc Vitals Group     BP 12/05/19 1629 (!) 108/53     Pulse Rate 12/05/19 1629 (!) 51     Resp --      Temp 12/05/19 1629 98.2 F (36.8 C)     Temp Source 12/05/19 1629 Oral     SpO2 12/05/19 1629 98 %     Weight 12/05/19 1630 214 lb (97.1 kg)     Height 12/05/19 1630 '5\' 3"'  (1.6 m)     Head Circumference --      Peak Flow --      Pain Score 12/05/19 1630 9     Pain Loc --      Pain Edu? --      Excl. in [place]? --     Constitutional: Alert and oriented. Well appearing and in no acute distress. Eyes: Conjunctivae are normal.  Head: Atraumatic. Nose: No congestion/rhinnorhea. Mouth/Throat: Mucous membranes are moist.  Neck:  supple no lymphadenopathy  noted Cardiovascular: Normal rate, regular rhythm. Heart sounds are normal Respiratory: Normal respiratory effort.  No retractions, lungs c t a  Abdomen: Abdomen appears to be bloated, area is tender in the right lower quadrant, bowel sounds normal GU: deferred Musculoskeletal: FROM all extremities, warm and well perfused Neurologic:  Normal speech and language.  Skin:  Skin is warm, dry and intact. No rash noted. Psychiatric: Mood and affect are normal. Speech and behavior are normal.  ____________________________________________  LABS (all labs ordered are listed, but only abnormal results are displayed)  Labs Reviewed  URINALYSIS, COMPLETE (UACMP) WITH MICROSCOPIC - Abnormal; Notable for the following components:      Result Value   Color, Urine YELLOW (*)    APPearance CLOUDY (*)    Leukocytes,Ua MODERATE (*)    WBC, UA >50 (*)    Bacteria, UA MANY (*)    All other components within normal limits  BASIC METABOLIC PANEL - Abnormal; Notable for the following components:   Sodium 132 (*)    Chloride 86 (*)    BUN 27 (*)    GFR calc non Af Amer 59 (*)    Anion gap 16 (*)    All other components within normal limits  CBC - Abnormal; Notable for the following components:   WBC 11.8 (*)    Hemoglobin 11.2 (*)    HCT 33.1 (*)    MCV 79.8 (*)    RDW 16.3 (*)    All other components within normal limits  URINE CULTURE  BRAIN NATRIURETIC PEPTIDE   ____________________________________________   ____________________________________________  RADIOLOGY  CT renal stone study is negative  ____________________________________________   PROCEDURES  Procedure(s) performed: No  Procedures    ____________________________________________   INITIAL IMPRESSION / ASSESSMENT AND PLAN / ED COURSE  Pertinent labs & imaging results that were available during my care of the patient were reviewed by me and considered in my medical decision making (see chart for details).    Patient 74 year old female presents emergency department with flank pain and urinary frequency.  See HPI  Physical exam shows the abdomen to be tender in the right lower quadrant, abdomen appears to be bloated, remainder the exam is unremarkable at this time  CBC has elevated WBC of 82.9, basic metabolic panel has sodium of 132, chloride 86, BUN is elevated at 27, GFR is decreased and elevated anion gap at 16, urinalysis does show moderate leuks, greater than 50 WBCs and many bacteria, white blood cell clumps are present  DDx: UTI, pyelonephritis, infected kidney stone, kidney stone  CT renal stone study is negative other than bladder wall thickening.  Did discuss findings with patient.  Feel she has an acute UTI.  She is to start taking her Lasix again tomorrow.  At this time she does not appear to be in CHF due to the BNP being normal.  She was given Rocephin 1 g IM.  She will be given a prescription for Omnicef twice daily for 7 days.  Follow-up with your regular doctor if not improved in 3 days.  Return emergency department if worsening.  Signs and symptoms of pyelonephritis were discussed    SHENIYA GARCIAPEREZ was evaluated in Emergency Department on 12/05/2019 for the symptoms described in the history of present illness. She was evaluated in the context of the global COVID-19 pandemic, which necessitated consideration that the patient might be at risk for infection with the SARS-CoV-2 virus that causes COVID-19. Institutional protocols and algorithms that pertain to the evaluation of patients at risk for COVID-19 are in a state of rapid change based on information released by regulatory bodies including the CDC and federal and state organizations. These policies and algorithms were followed during the patient's care in the ED.    As part of my medical decision making, I reviewed the following data within the [place] History obtained from family, Nursing notes reviewed and  incorporated, Labs reviewed , Old chart reviewed, Radiograph reviewed ,  Notes from prior ED visits and [place] Controlled Substance Database  ____________________________________________   FINAL CLINICAL IMPRESSION(S) / ED DIAGNOSES  Final diagnoses:  Urinary tract infection without hematuria, site unspecified      NEW MEDICATIONS STARTED DURING THIS VISIT:  New Prescriptions   CEFDINIR (OMNICEF) 300 MG CAPSULE    Take 1 capsule (300 mg total) by mouth 2 (two) times daily.     Note:  This document was prepared using Dragon voice recognition software and may include unintentional dictation errors.    [name], PA-C 12/05/19 2010    Carrie Mew, MD 12/06/19 [phone]

## 2019-12-05 NOTE — Discharge Instructions (Signed)
Follow-up with your regular doctor if not improving in 3 days.  Return emergency department if worsening.  Take medication as prescribed.

## 2019-12-09 LAB — URINE CULTURE: Culture: >=100000 — AB

## 2019-12-10 NOTE — Progress Notes (Signed)
ED Antimicrobial Stewardship Positive Culture Follow Up   Karen [name] is an 74 y.o. female who presented to [facility] on 12/05/2019 with a chief complaint of  Chief Complaint  Patient presents with  . Flank Pain  . Urinary Frequency    Recent Results (from the past 720 hour(s))  Urine culture     Status: Abnormal   Collection Time: 12/05/19  4:38 PM   Specimen: Urine, Random  Result Value Ref Range Status   Specimen Description   Final    URINE, RANDOM Performed at [facility], [address] [place], [place] [address]    Special Requests   Final    NONE Performed at [facility], [address] [place], [place] [address]    Culture (A)  Final    >=100,000 COLONIES/mL ESCHERICHIA COLI >=100,000 COLONIES/mL AEROCOCCUS URINAE    Report Status 12/09/2019 FINAL  Final   Organism ID, Bacteria ESCHERICHIA COLI (A)  Final      Susceptibility   Escherichia coli - MIC*    AMPICILLIN 8 SENSITIVE Sensitive     CEFAZOLIN <=4 SENSITIVE Sensitive     CEFTRIAXONE <=0.25 SENSITIVE Sensitive     CIPROFLOXACIN 0.5 SENSITIVE Sensitive     GENTAMICIN <=1 SENSITIVE Sensitive     IMIPENEM <=0.25 SENSITIVE Sensitive     NITROFURANTOIN <=16 SENSITIVE Sensitive     TRIMETH/SULFA <=20 SENSITIVE Sensitive     AMPICILLIN/SULBACTAM 4 SENSITIVE Sensitive     PIP/TAZO <=4 SENSITIVE Sensitive     * >=100,000 COLONIES/mL ESCHERICHIA COLI    [x]  Treated with cefdinir, organism susceptible to prescribed antimicrobial (aerococcus likely contaminant) []  Patient discharged originally without antimicrobial agent and treatment is now indicated  New antibiotic prescription: no change in regimen needed   Mills Koller 12/10/2019, 2:09 PM Clinical Pharmacist

## 2020-01-01 ENCOUNTER — Telehealth (HOSPITAL_COMMUNITY): Payer: Self-pay

## 2020-01-01 NOTE — Telephone Encounter (Signed)
[place] had called left me message stating she needed some help with food.  She advised her husband is not in the best health and unable to do as much.  She states they are eating and has food but what they have is not healthy for her.  She loves the Moms meals that [facility] has provided but they will not pay for more.  She states helps her so much.  Her insurance will not pay and they are not affordable for them.  Will reach out to services available in [facility] to see if anyone can help.  She is also still getting up 5 to 6 times a night to urinate and she states she is not sleeping.  She is taking her fluid pills early in the day, she seen her PCP over this matter but they did not change anything or add any medications.  I advised her I will reach out to her doctor again but not sure if I can help with that matter.  Will continue to visit for heart failure and try to assist in anyway I can.  Mentioned Palliative Care to her and she states she will thingk about it, also mentioned getting Home health and she does not like a lot of visitors.   Will try to reach out to see if social work can visit with them.   [place] [phone]

## 2020-01-02 IMAGING — CT CT RENAL STONE PROTOCOL
1 of 2 series · 15 of 32 positions shown, 19 images · non-contrast
Comparison: CT of the abdomen and pelvis on 08/27/2017 the

CLINICAL DATA: RIGHT-sided abdominal pain.

EXAM:
CT ABDOMEN AND PELVIS WITHOUT CONTRAST
TECHNIQUE: Multidetector CT imaging of the abdomen and pelvis was performed
following the standard protocol without IV contrast.

[Series 2: axial st · axial · 0.87mm/px · z∈[-436,-76]mm · 15 of 80 slices shown, 19 images]
[im 4/80  soft-tissue]
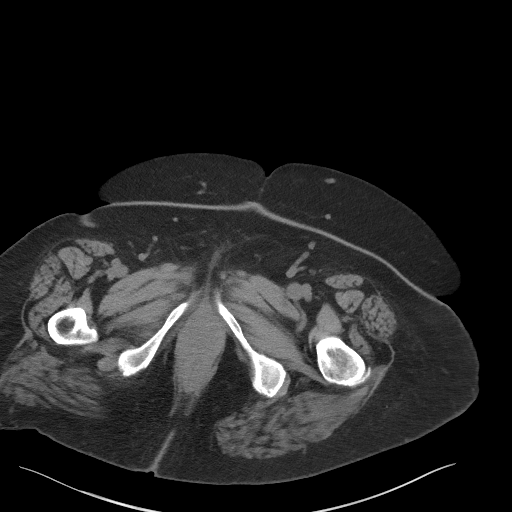
[im 4/80  bone]
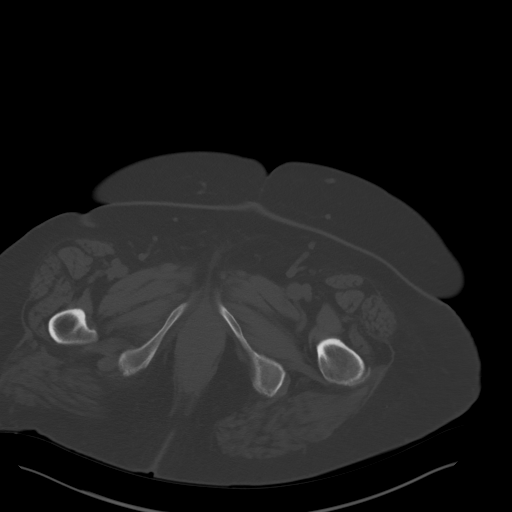
[im 10/80  soft-tissue]
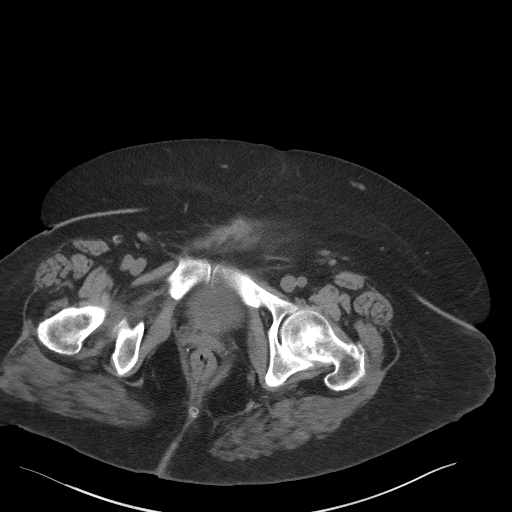
[im 16/80  soft-tissue]
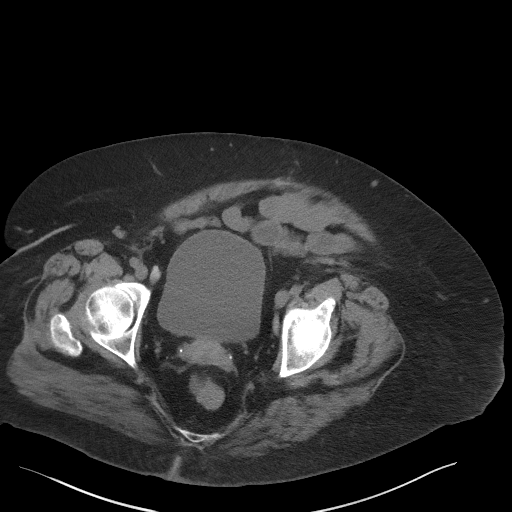
[im 23/80  soft-tissue]
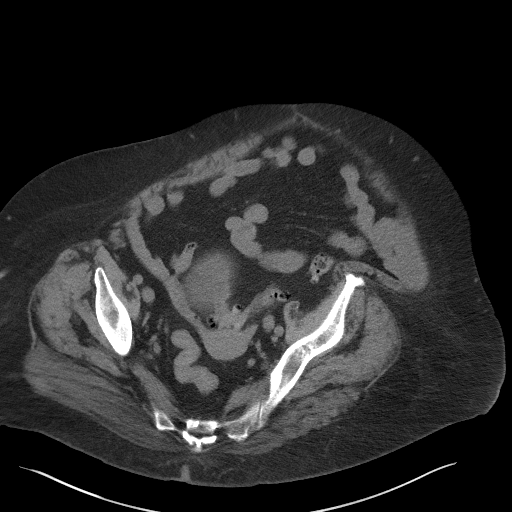
[im 29/80  soft-tissue]
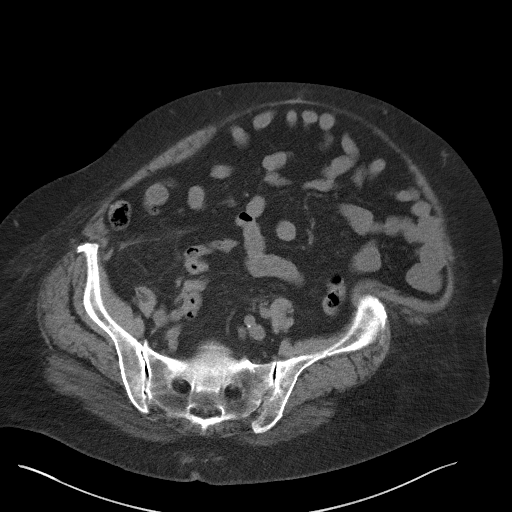
[im 35/80  soft-tissue]
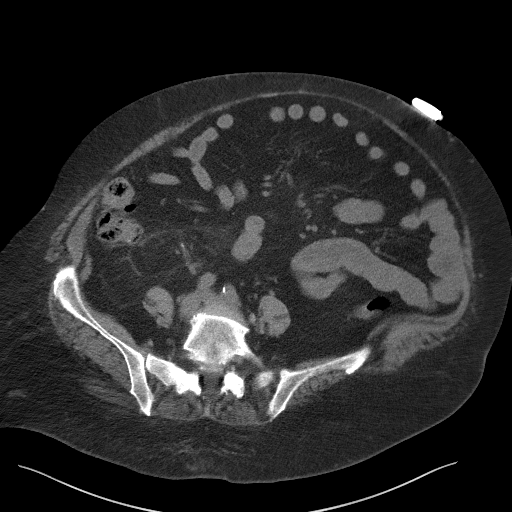
[im 42/80  soft-tissue]
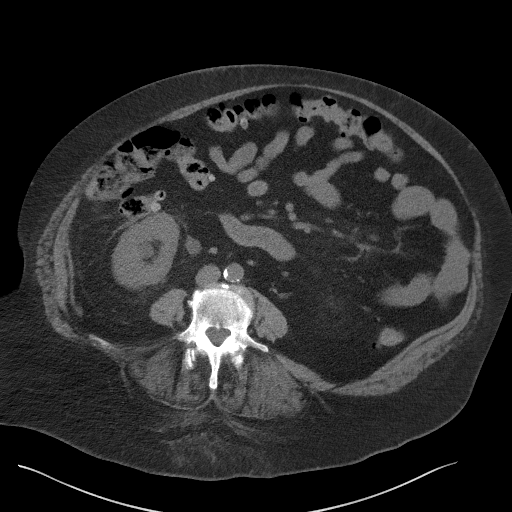
[im 45/80  soft-tissue]
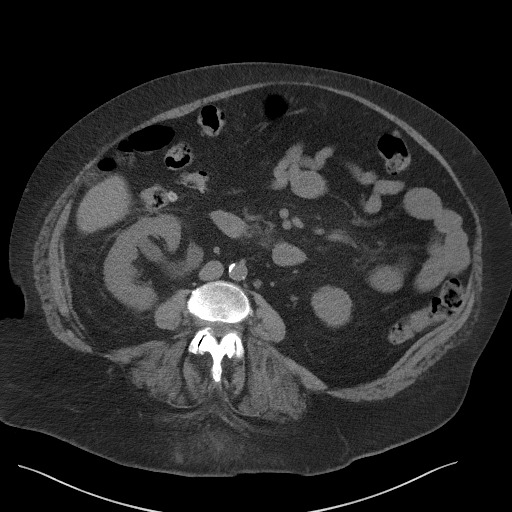
[im 51/80  soft-tissue]
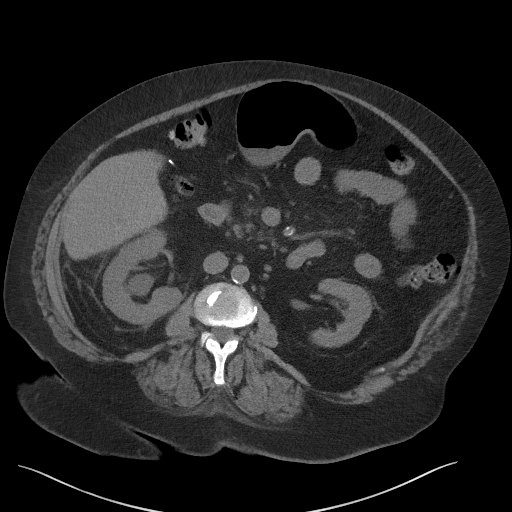
[im 51/80  bone]
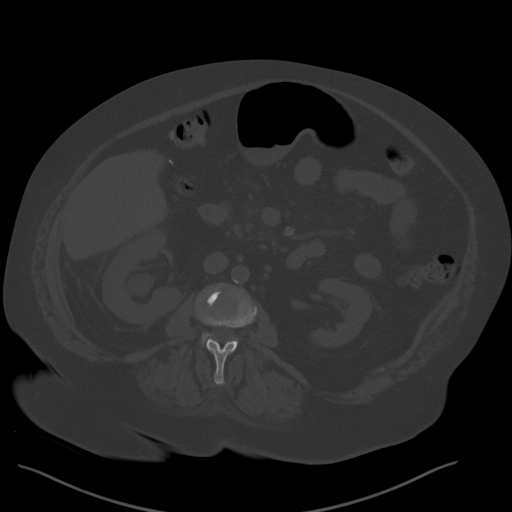
[im 57/80  soft-tissue]
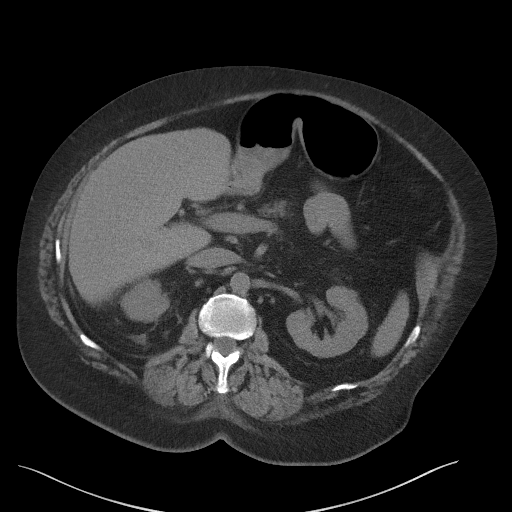
[im 64/80  soft-tissue]
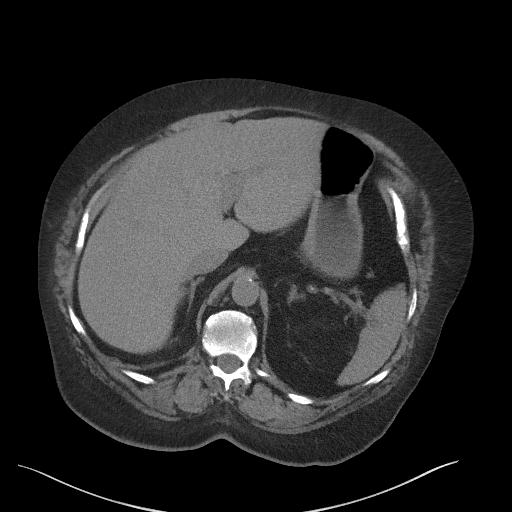
[im 67/80  lung]
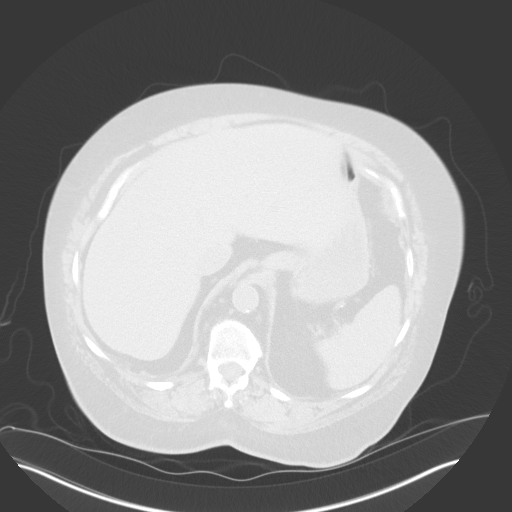
[im 70/80  soft-tissue]
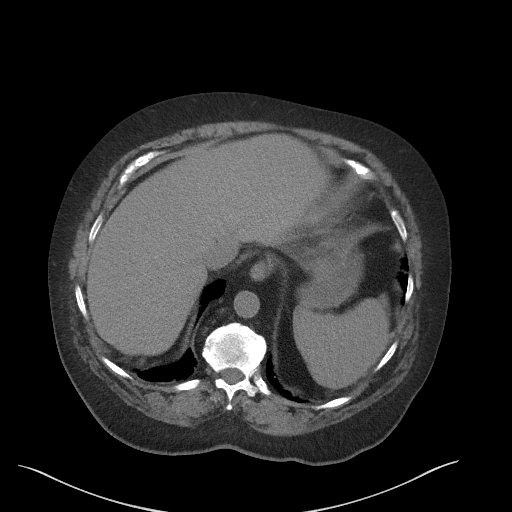
[im 70/80  lung]
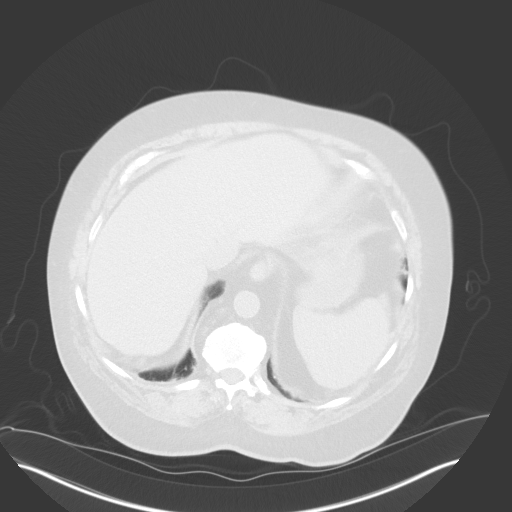
[im 73/80  lung]
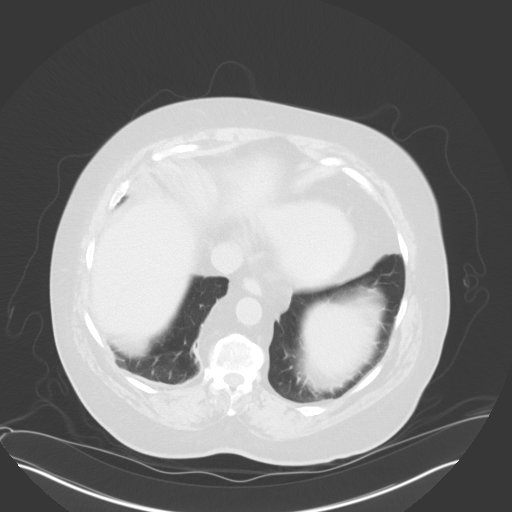
[im 76/80  soft-tissue]
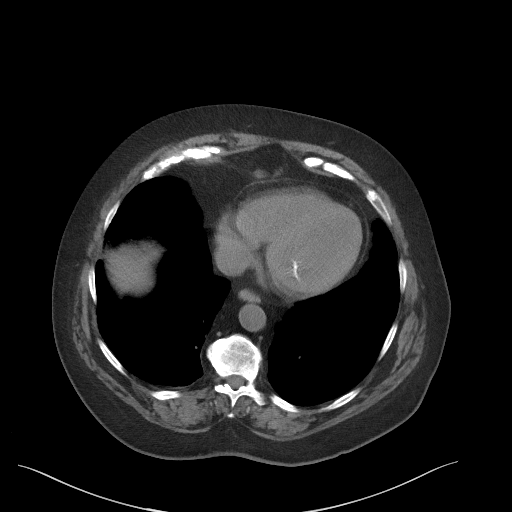
[im 76/80  lung]
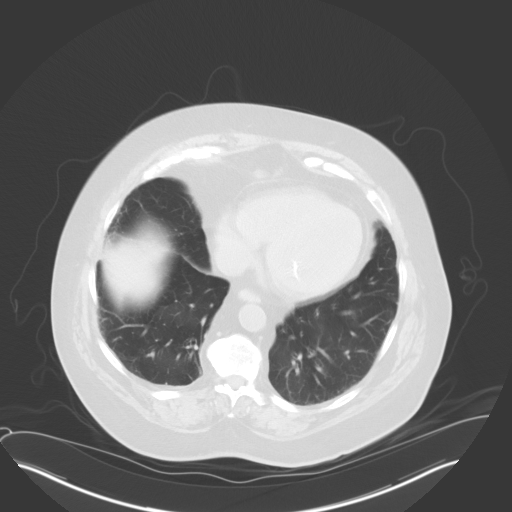

[15 of 32 positions shown; findings below may reference images not displayed]

FINDINGS: Lower chest: There is mitral calcification. Heart size appears
normal. Lung bases are unremarkable.

Hepatobiliary: Status post cholecystectomy.  No liver lesions.

Pancreas: Unremarkable. No pancreatic ductal dilatation or
surrounding inflammatory changes.

Spleen: Normal in size without focal abnormality.

Adrenals/Urinary Tract: Renal glands are normal. There is moderate
RIGHT hydronephrosis secondary to a calculus at the ureteropelvic
junction which measures 9 millimeters in diameter. There is RIGHT
perinephric stranding. The distal RIGHT ureter is unremarkable. The
LEFT kidney and ureter are unremarkable. Urinary bladder is normal
in appearance.

Stomach/Bowel: The stomach and small bowel loops are normal in
appearance. There are numerous colonic diverticula but no associated
inflammation. Appendix is not seen.

Vascular/Lymphatic: There is atherosclerotic calcification of the
abdominal aorta not associated with aneurysm. No abdominal
adenopathy. There is subtle stranding in the mesenteric fat not
associated significant adenopathy.

Reproductive: Uterus is present.  No adnexal mass.

Other: No free pelvic fluid. There is anterior abdominal wall
laxity. No hernia.

Musculoskeletal: There is grade 1 anterolisthesis of L4 on L5,
measuring 7 millimeters. Degenerative changes are seen in the LOWER
thoracic and lumbar spine.
IMPRESSION: 1. 9 millimeter obstructing stone at the RIGHT ureteropelvic
junction with associated hydronephrosis and perinephric stranding.
2. Mitral calcifications.
3. Cholecystectomy.
4. Significant colonic diverticulosis.
5.  Aortic atherosclerosis.  ([id number])
6. Spondylosis.  Grade 1 anterolisthesis of L4 on L5.

## 2020-01-04 IMAGING — CR DG ABDOMEN 1V
1 series · 3 of 3 positions shown · non-contrast
Comparison: 09/15/2017.

CLINICAL DATA: Stone removal with stent placement.

EXAM:
ABDOMEN - 1 VIEW

[Series 1: dg abd 1 view · 0.14mm/px · 3 of 3 slices shown]
[im 1/3]
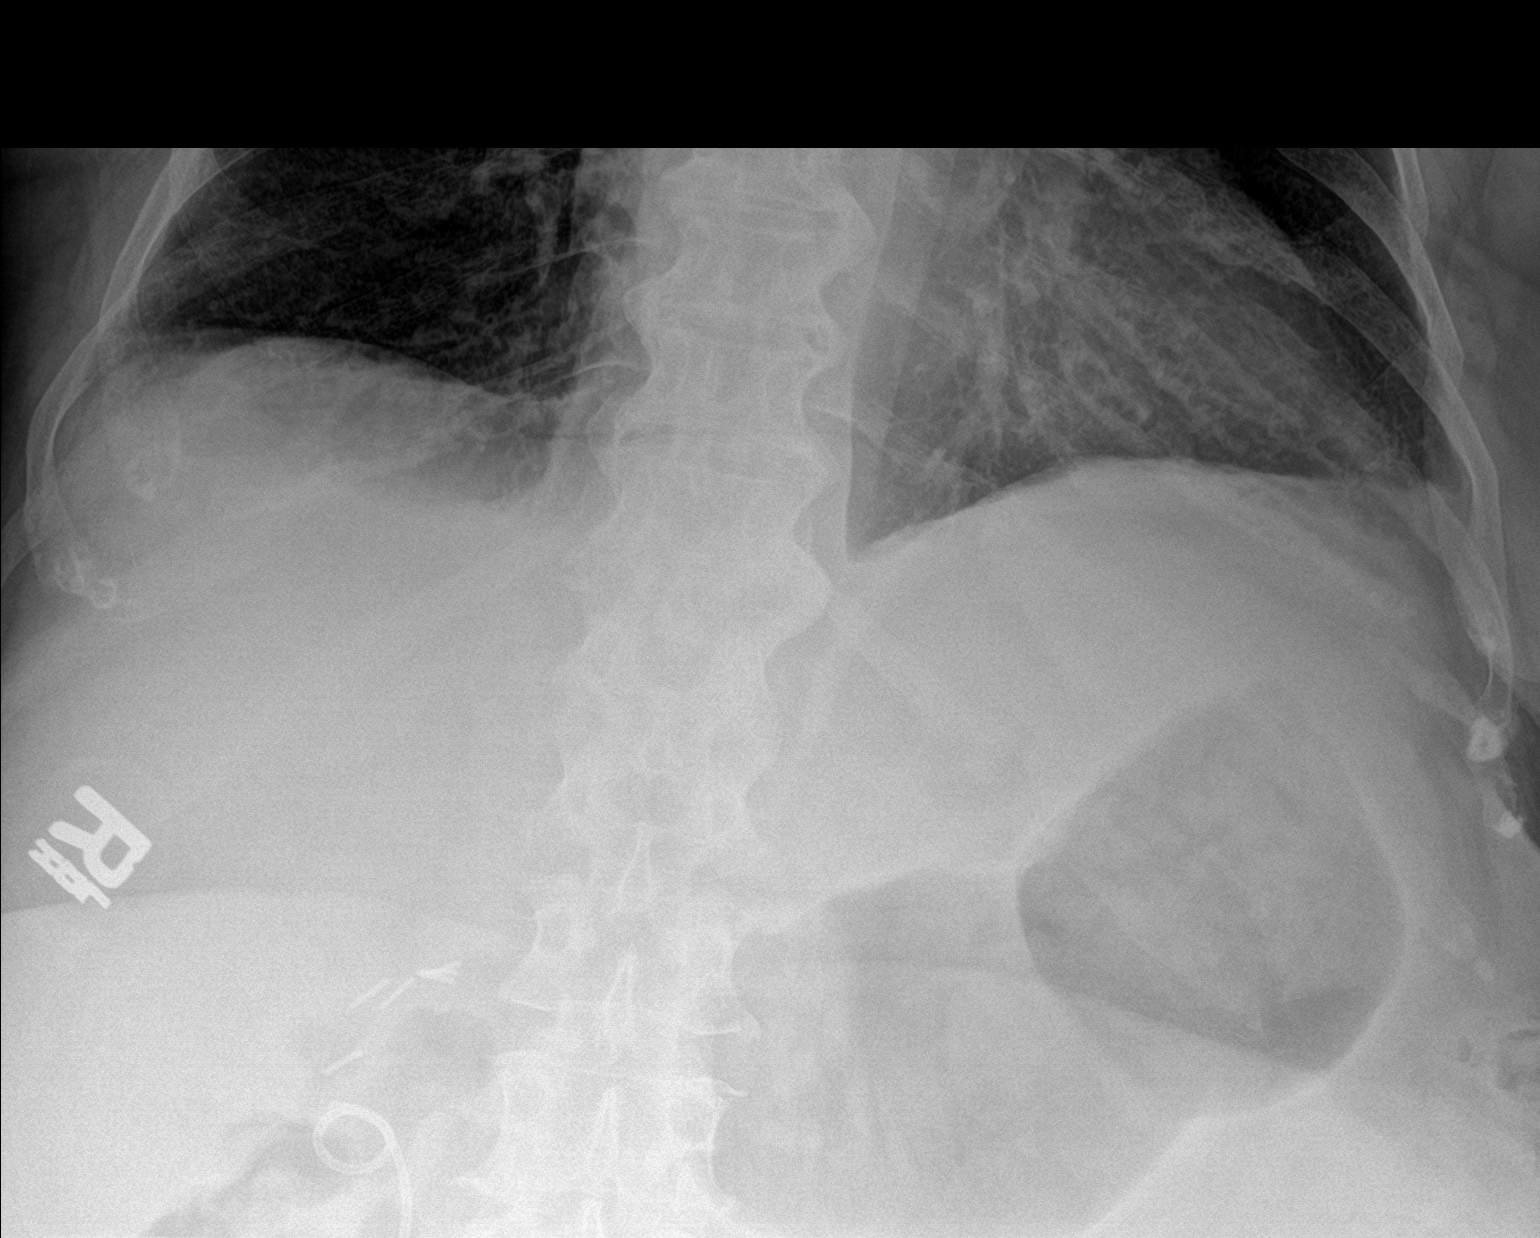
[im 2/3]
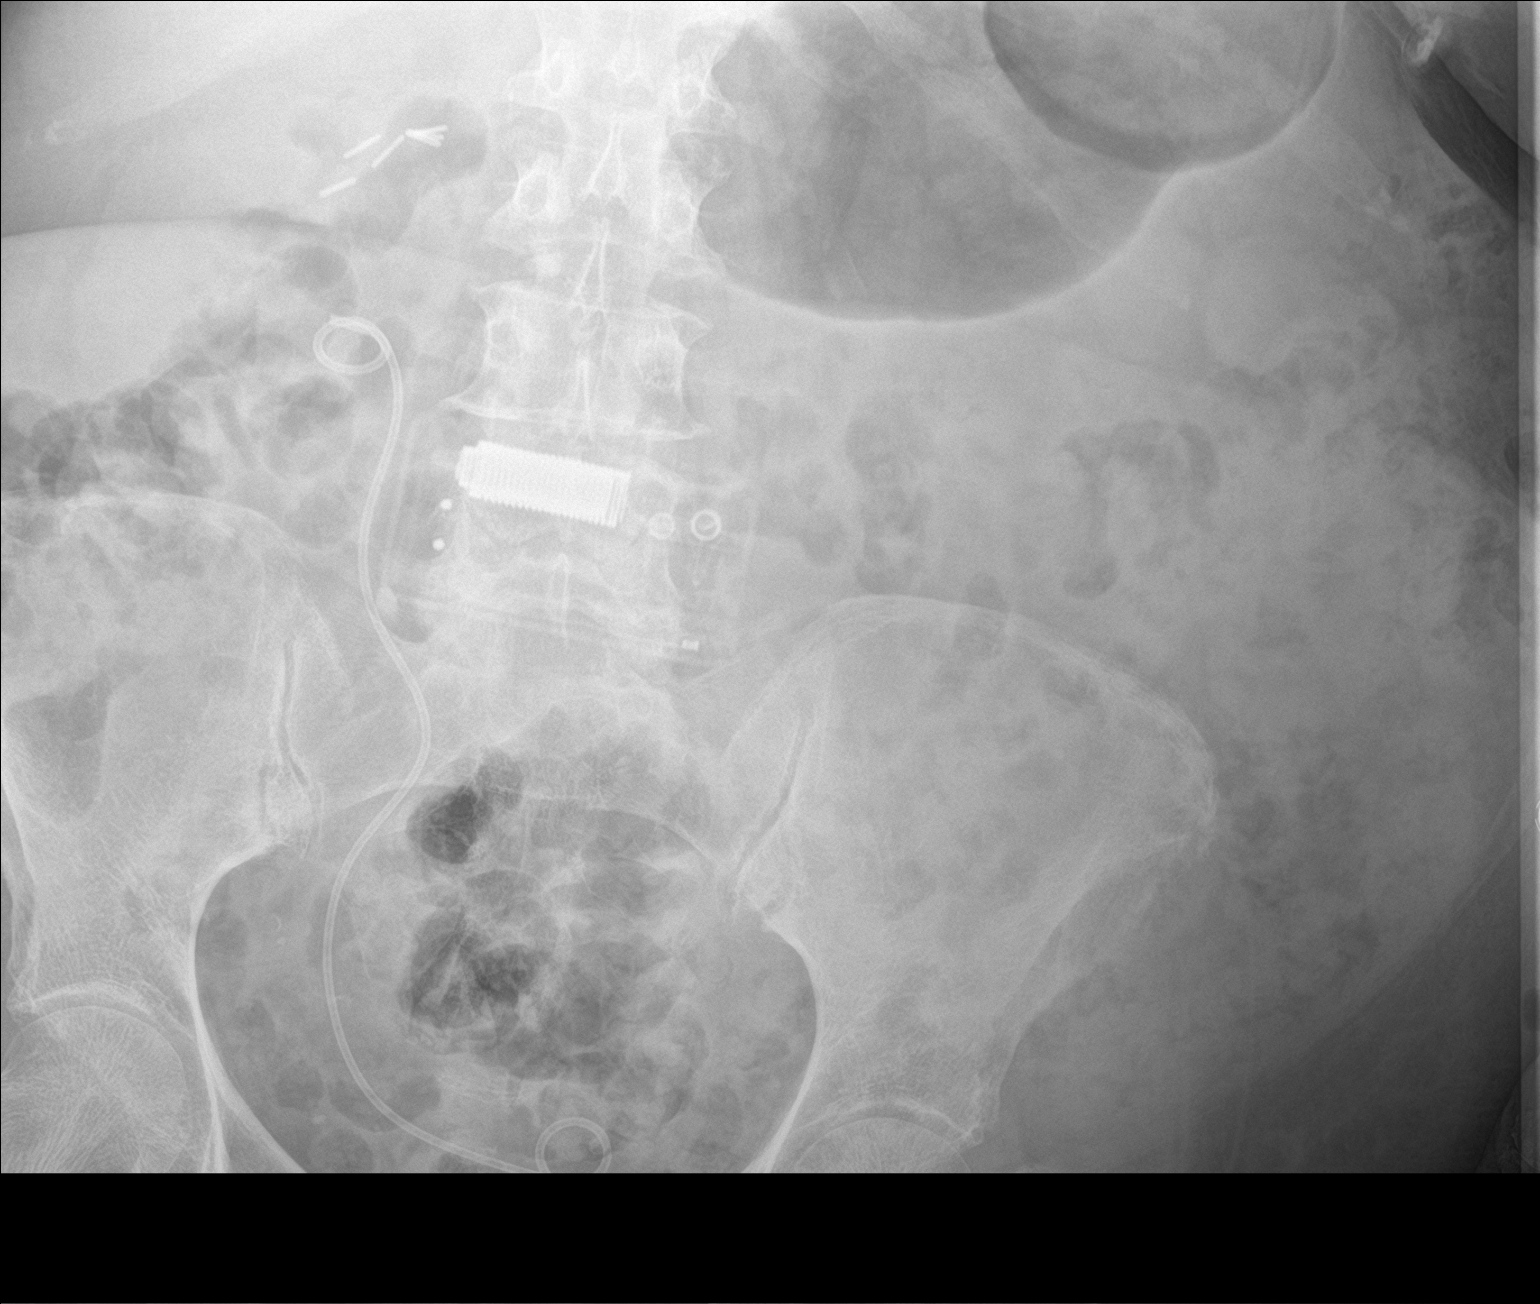
[im 3/3]
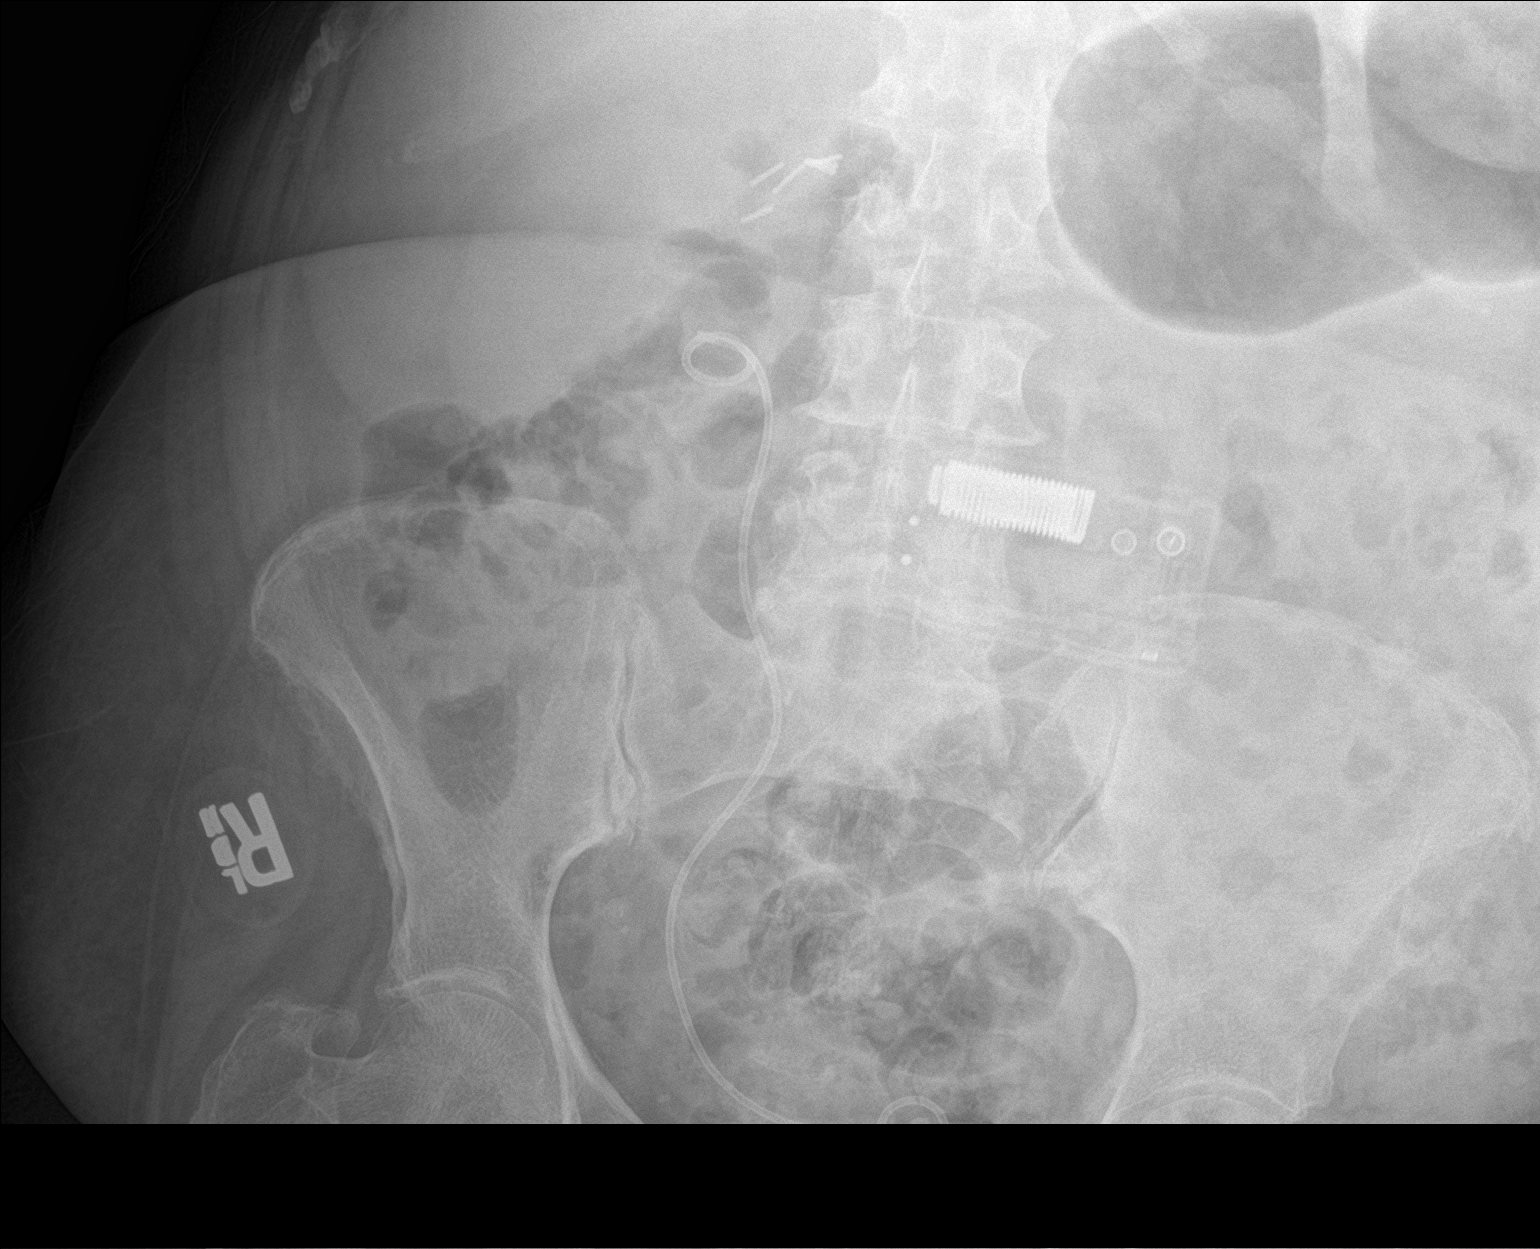

[3 of 3 positions shown; findings below may reference images not displayed]

FINDINGS: There is been interval placement of a right-sided double-J
nephroureteral stent. The proximal portion of the stent is in the
projection of the expected location of the right renal pelvis. The
distal portion is in the pelvis. Calcific density adjacent to the
mid portion of the right stent measuring 7 mm is identified and may
represent ureteral calculus.

Previous cholecystectomy.
IMPRESSION: 1. Interval placement of right-sided nephroureteral stent.
2. Calcific density along the course of the right ureter measuring 7
mm may represent patient's ureteral calculus.

## 2020-01-27 ENCOUNTER — Telehealth (HOSPITAL_COMMUNITY): Payer: Self-pay

## 2020-01-27 ENCOUNTER — Encounter (HOSPITAL_COMMUNITY): Payer: Self-pay

## 2020-01-27 NOTE — Progress Notes (Signed)
Heard back from [employer] in [employer], they will start receiving meals on wheels Monday 29th of November.  Also spoke to [place] [facility] and husband and they advised they will be bringing both of them meals and possibly in near future bring 2 meals a day.  They will also check home for safety rails and provide pantry items if needed.  They will also provide dog food for their dog.    Karen [name] [phone]

## 2020-01-27 NOTE — Telephone Encounter (Signed)
Today had a telephone appt with Karen [name].  She states doing better, they have purchased a purwick to use at night time to be able to get some sleep.  She really would like to have the Moms meals back, [place] states they are working on a program to offer patients.  She states her husband has been down and unable to do as much for them.  Will contact [employer] to see if they can deliver to them.  She has all her medications and aware of how to take them.  She is aware of her up coming appts.  She states weight goes up and down but not went up and stayed up lately.  She states she is able to keep it down with the Moms meals with them calculating the sodium and carbs for her.  Her husband and her is not able to figure out what to cook.  Have gave them cook books and list of foods that are low sodium and a list of high sodium foods.  She denies any chest pain, headaches, dizziness or increased shortness of breath.  Will continue to visit for heart failure.   [place] [phone]

## 2020-02-11 ENCOUNTER — Ambulatory Visit: Payer: Medicare Other | Admitting: Family

## 2020-02-17 ENCOUNTER — Telehealth (HOSPITAL_COMMUNITY): Payer: Self-pay

## 2020-02-17 NOTE — Telephone Encounter (Signed)
Contacted Karen [name] today to wish her Happy Rudene Anda.  She states been doing well.  She states she has gained some but not fluid.  She states been eating the meals on wheels.  She states her sugars has been elevated and she has contacted her doctor.  She states been in 200 and 300's.  She has all her medications, verified them.  She states her breathing has been her normal, denies any chest pain, headaches or dizziness.  Sleeping better at night since got the purewick system.  She denies needing anything.  She is aware of up coming appts.  Will continue to visit for heart failure.   [place] [phone]

## 2020-03-06 DIAGNOSIS — I2510 Atherosclerotic heart disease of native coronary artery without angina pectoris: Secondary | ICD-10-CM | POA: Insufficient documentation

## 2020-03-10 ENCOUNTER — Other Ambulatory Visit: Payer: Self-pay | Admitting: Cardiovascular Disease

## 2020-03-11 NOTE — Telephone Encounter (Signed)
Pt last saw Dr Rockey Situ 10/24/19, last labs 12/05/19 Creat 0.94, age 75, weight 97.1kg, based on specified criteria pt is on appropriate dosage of Eliquis 5mg  BID.  Will refill rx.

## 2020-04-01 ENCOUNTER — Telehealth (HOSPITAL_COMMUNITY): Payer: Self-pay

## 2020-04-01 NOTE — Telephone Encounter (Signed)
Contacted Karen [name] today.  She is doing well she stated.  She states her weight fluctuates but stays pretty close to same thing.  She states she has all her medications.  She states her sugars are up and down.  She denies any problems such as chest pain, headaches, dizziness or increased shortness of breath.  She states they have everything for daily living.  She still has meals on wheels for her and husband.  She states sleeping better at night since got a purewick.  She is aware of up coming appts.  She is aware of how to take her meds.  Will make a home visit in about 2 weeks.  Will continue to visit for heart failure, diet and medication management.   [place] [phone]

## 2020-04-30 ENCOUNTER — Other Ambulatory Visit: Payer: Self-pay | Admitting: Cardiovascular Disease

## 2020-04-30 NOTE — Telephone Encounter (Signed)
Please schedule overdue office visit. Patient cancelled last appointment. Thank you!

## 2020-05-03 ENCOUNTER — Telehealth (HOSPITAL_COMMUNITY): Payer: Self-pay

## 2020-05-03 NOTE — Telephone Encounter (Signed)
Scheduled for 3/10 at 2:20

## 2020-05-03 NOTE — Telephone Encounter (Signed)
Contacted [place] and she advised has been doing well.  She is aware of appts coming up.  She has all her medications.  She states still getting meals on wheels and they help a lot.  Will make a home visit this week.  She states swelling is better and weight is staying around same.  Sugars up and down.  Sleeping better and denies any chest pain or increased shortness of breath.  Lives with husband that helps her out with going to appts.  Will visit for heart failure.   [place] [phone]

## 2020-05-04 ENCOUNTER — Other Ambulatory Visit (HOSPITAL_COMMUNITY): Payer: Self-pay

## 2020-05-04 ENCOUNTER — Encounter (HOSPITAL_COMMUNITY): Payer: Self-pay

## 2020-05-04 NOTE — Progress Notes (Signed)
Today had a home visit with Karen [name].  She states been doing well.  Her husband has been down and she is trying to do a little more.   She ambulates with walker.  She had shots in knees yesterday, she states they are feeling better.  She states sleeping much better at night, she fell out of bed a few times, they now place a rail on bed.  She uses a purewick at night.  She has no swelling in lower extremities.  Her sugars has been high, she is calling her endocrinologist about them, she wants to be placed on a different medication that worked better.  She is wanting an aid, advised her to see if PCP will put an order in, she has appt next week.  Discussed palliative care with her, she is going to think about it.  She has all her medications and aware of how to take them.  They are watching high sodium foods and not receiving meals on wheels no more.  They stated was high in sodium.  They are getting a food card with their insurance that helps out with groceries.  Advised them to wash canned vegetables, they advised will start.  They have a son living with them that will not help care for them.  Her husband is still able to drive her to appts.  She denies any chest pain, dizziness or increased shortness of breath.  Aware of up coming appts.  They have everything for daily living.  Will continue to visit for heart failure, diet and medication management.   [place] [phone]

## 2020-05-08 ENCOUNTER — Encounter: Payer: Self-pay | Admitting: Emergency Medicine

## 2020-05-08 ENCOUNTER — Ambulatory Visit (INDEPENDENT_AMBULATORY_CARE_PROVIDER_SITE_OTHER)
Admission: EM | Admit: 2020-05-08 | Discharge: 2020-05-08 | Disposition: A | Discharge status: Home / Self Care | Payer: Medicare Other | Source: Home / Self Care | Attending: Sports Medicine | Admitting: Sports Medicine

## 2020-05-08 ENCOUNTER — Other Ambulatory Visit: Payer: Self-pay

## 2020-05-08 ENCOUNTER — Emergency Department
Admission: EM | Admit: 2020-05-08 | Discharge: 2020-05-08 | Disposition: A | Discharge status: Home / Self Care | Payer: Medicare Other | Attending: Emergency Medicine | Admitting: Emergency Medicine

## 2020-05-08 DIAGNOSIS — Z7901 Long term (current) use of anticoagulants: Secondary | ICD-10-CM | POA: Diagnosis not present

## 2020-05-08 DIAGNOSIS — N184 Chronic kidney disease, stage 4 (severe): Secondary | ICD-10-CM | POA: Diagnosis not present

## 2020-05-08 DIAGNOSIS — J45909 Unspecified asthma, uncomplicated: Secondary | ICD-10-CM | POA: Insufficient documentation

## 2020-05-08 DIAGNOSIS — I480 Paroxysmal atrial fibrillation: Secondary | ICD-10-CM | POA: Diagnosis not present

## 2020-05-08 DIAGNOSIS — I130 Hypertensive heart and chronic kidney disease with heart failure and stage 1 through stage 4 chronic kidney disease, or unspecified chronic kidney disease: Secondary | ICD-10-CM | POA: Diagnosis not present

## 2020-05-08 DIAGNOSIS — J9611 Chronic respiratory failure with hypoxia: Secondary | ICD-10-CM | POA: Insufficient documentation

## 2020-05-08 DIAGNOSIS — Z79899 Other long term (current) drug therapy: Secondary | ICD-10-CM | POA: Insufficient documentation

## 2020-05-08 DIAGNOSIS — E669 Obesity, unspecified: Secondary | ICD-10-CM | POA: Diagnosis not present

## 2020-05-08 DIAGNOSIS — E1122 Type 2 diabetes mellitus with diabetic chronic kidney disease: Secondary | ICD-10-CM | POA: Diagnosis not present

## 2020-05-08 DIAGNOSIS — E1140 Type 2 diabetes mellitus with diabetic neuropathy, unspecified: Secondary | ICD-10-CM | POA: Insufficient documentation

## 2020-05-08 DIAGNOSIS — Z794 Long term (current) use of insulin: Secondary | ICD-10-CM

## 2020-05-08 DIAGNOSIS — J449 Chronic obstructive pulmonary disease, unspecified: Secondary | ICD-10-CM | POA: Diagnosis not present

## 2020-05-08 DIAGNOSIS — E119 Type 2 diabetes mellitus without complications: Secondary | ICD-10-CM

## 2020-05-08 DIAGNOSIS — I5032 Chronic diastolic (congestive) heart failure: Secondary | ICD-10-CM

## 2020-05-08 DIAGNOSIS — Z87891 Personal history of nicotine dependence: Secondary | ICD-10-CM | POA: Diagnosis not present

## 2020-05-08 DIAGNOSIS — R739 Hyperglycemia, unspecified: Secondary | ICD-10-CM

## 2020-05-08 DIAGNOSIS — E1169 Type 2 diabetes mellitus with other specified complication: Secondary | ICD-10-CM | POA: Insufficient documentation

## 2020-05-08 DIAGNOSIS — E1165 Type 2 diabetes mellitus with hyperglycemia: Secondary | ICD-10-CM

## 2020-05-08 DIAGNOSIS — Z6838 Body mass index (BMI) 38.0-38.9, adult: Secondary | ICD-10-CM

## 2020-05-08 LAB — BASIC METABOLIC PANEL
Anion gap: 16 — ABNORMAL HIGH (ref 5–15)
Anion gap: 11 (ref 5–15)
BUN: 50 mg/dL — ABNORMAL HIGH (ref 8–23)
BUN: 48 mg/dL — ABNORMAL HIGH (ref 8–23)
CO2: 30 mmol/L (ref 22–32)
CO2: 35 mmol/L — ABNORMAL HIGH (ref 22–32)
Calcium: 9.1 mg/dL (ref 8.9–10.3)
Calcium: 9.2 mg/dL (ref 8.9–10.3)
Chloride: 79 mmol/L — ABNORMAL LOW (ref 98–111)
Chloride: 81 mmol/L — ABNORMAL LOW (ref 98–111)
Creatinine, Ser: 1.47 mg/dL — ABNORMAL HIGH (ref 0.44–1.00)
Creatinine, Ser: 1.37 mg/dL — ABNORMAL HIGH (ref 0.44–1.00)
GFR, Estimated: 37 mL/min — ABNORMAL LOW (ref >60)
GFR, Estimated: 40 mL/min — ABNORMAL LOW (ref >60)
Glucose, Bld: 417 mg/dL — ABNORMAL HIGH (ref 70–99)
Glucose, Bld: 276 mg/dL — ABNORMAL HIGH (ref 70–99)
Potassium: 3.4 mmol/L — ABNORMAL LOW (ref 3.5–5.1)
Potassium: 3.5 mmol/L (ref 3.5–5.1)
Sodium: 125 mmol/L — ABNORMAL LOW (ref 135–145)
Sodium: 127 mmol/L — ABNORMAL LOW (ref 135–145)

## 2020-05-08 LAB — BETA-HYDROXYBUTYRIC ACID: Beta-Hydroxybutyric Acid: 0.11 mmol/L (ref 0.05–0.27)

## 2020-05-08 LAB — CBC
HCT: 32.7 % — ABNORMAL LOW (ref 36.0–46.0)
Hemoglobin: 11.4 g/dL — ABNORMAL LOW (ref 12.0–15.0)
MCH: 30 pg (ref 26.0–34.0)
MCHC: 34.9 g/dL (ref 30.0–36.0)
MCV: 86.1 fL (ref 80.0–100.0)
Platelets: 297 10*3/uL (ref 150–400)
RBC: 3.8 MIL/uL — ABNORMAL LOW (ref 3.87–5.11)
RDW: 13.7 % (ref 11.5–15.5)
WBC: 12.3 10*3/uL — ABNORMAL HIGH (ref 4.0–10.5)
nRBC: 0 % (ref 0.0–0.2)

## 2020-05-08 LAB — GLUCOSE, CAPILLARY: Glucose-Capillary: 442 mg/dL — ABNORMAL HIGH (ref 70–99)

## 2020-05-08 LAB — CBG MONITORING, ED
Glucose-Capillary: 407 mg/dL — ABNORMAL HIGH (ref 70–99)
Glucose-Capillary: 253 mg/dL — ABNORMAL HIGH (ref 70–99)

## 2020-05-08 MED ORDER — POTASSIUM CHLORIDE 20 MEQ PO PACK
40.0000 meq | PACK | Freq: Once | ORAL | Status: DC
  Filled 2020-05-08: qty 2

## 2020-05-08 MED ORDER — INSULIN ASPART 100 UNIT/ML ~~LOC~~ SOLN
10.0000 [IU] | Freq: Once | SUBCUTANEOUS | Status: AC
  Administered 2020-05-08: 10 [IU] via INTRAVENOUS
  Filled 2020-05-08: qty 1

## 2020-05-08 MED ORDER — LACTATED RINGERS IV BOLUS
1000.0000 mL | Freq: Once | INTRAVENOUS | Status: AC
  Administered 2020-05-08: 1000 mL via INTRAVENOUS

## 2020-05-08 MED ORDER — ONDANSETRON HCL 4 MG/2ML IJ SOLN
INTRAMUSCULAR | Status: AC
  Administered 2020-05-08: 4 mg via INTRAVENOUS
  Filled 2020-05-08: qty 2

## 2020-05-08 MED ORDER — POTASSIUM CHLORIDE CRYS ER 20 MEQ PO TBCR
40.0000 meq | EXTENDED_RELEASE_TABLET | Freq: Once | ORAL | Status: AC
  Administered 2020-05-08: 40 meq via ORAL
  Filled 2020-05-08: qty 2

## 2020-05-08 MED ORDER — ONDANSETRON HCL 4 MG/2ML IJ SOLN: 4.0000 mg | Freq: Once | INTRAMUSCULAR | Status: AC

## 2020-05-08 NOTE — ED Notes (Signed)
Patient is being discharged from the Urgent Care and sent to the Emergency Department via POV . Per Dr. Verda Cumins, patient is in need of higher level of care due to hyperglycemia. Patient is aware and verbalizes understanding of plan of care.  Vitals:   05/08/20 1523  BP: (!) 104/54  Pulse: 88  Resp: 18  Temp: 98.3 F (36.8 C)  SpO2: 98%

## 2020-05-08 NOTE — Discharge Instructions (Signed)
Increase your Humalin 70/30 to 56 units in the morning and 32 units at night.  Continue your other medications, and follow up with your doctor on Monday.

## 2020-05-08 NOTE — Discharge Instructions (Addendum)
Your blood sugar is extremely elevated.  You report that you have had blood sugars between 400-450 for 10 days.  I recommend that you go directly to [facility] emergency room for further evaluation.

## 2020-05-08 NOTE — ED Triage Notes (Addendum)
Pt presents to the ED for hyperglycemia. Pt checked her CBG this morning and it was 449. Pt went to [facility] and they told her to come here. Pt c/o of a headache. Denies dizziness and weakness. Pt is A&Ox4 and NAD.   Pt on 3L [place] for her hx of CHF and COPD.

## 2020-05-08 NOTE — ED Triage Notes (Addendum)
Pt states her blood sugar has been running 400-450 for about a week. She states she has been taking her insulin, and not eating sugar. Pt states her PCP stopped one of her "shots" and this has caused her blood sugar to gradually go up.  Per telephone note from endocrinologist pt switched from Vi-Go to [place] 70/30 01/15/20.

## 2020-05-08 NOTE — ED Provider Notes (Signed)
[facility]    CSN: [id number] Arrival date & time: 05/08/20  1507      History   Chief Complaint Chief Complaint  Patient presents with  . Hyperglycemia    HPI Karen [name] is a 75 y.o. female.   75 year old female who presents for evaluation of the above issue.  She reports her blood sugars have been running between 400-450 for about 10 days now.  She is being followed by endocrinology at [facility] clinic but she has not called them to let them know that her blood sugars have been running high.  She reports no change in her diet.  She does report that she is more sleepy than normal.  She denies any weight gain or weight loss.  No abdominal pain.  She reports she has been taking her insulin as directed without any changes despite her sugars being high.  She reports taking 50 units of her insulin in the morning and 38 units in the evening.  She also takes Metformin at 1000 mg twice daily.  She says she did not call endocrinology because she has an appointment in April.  She denies chest pain or shortness of breath or any red flag signs or symptoms.  She denies any recent steroid use.  No recent infections or respiratory symptoms.  No reason for elevated blood sugars identified on history.     Past Medical History:  Diagnosis Date  . (HFpEF) heart failure with preserved ejection fraction ([place]) 2017   a. 2017 Echo: EF 50%; b. 06/2018 Echo: EF 50-55%; c. 08/2018 Echo: EF 50-55%, Nl RV fxn. RVSP 60.28mHg. Mild BAE. Mild to mod TR.     .Marland KitchenAcute on chronic respiratory failure with hypoxia and hypercapnia ([place] 01/07/2015  . Anemia   . 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 Asthma   . Cataract   . CHF (congestive heart failure) ([place]   . CKD (chronic kidney disease), stage III ([place]   . COPD (chronic obstructive pulmonary disease) ([facility]    (1) 06/2018 tobacco use, home 3L oxygen   . Diabetes mellitus without complication (HCC)    (1) A1C 7.7 (06/2018)  . Edema, peripheral 04/20/2014  . GI  bleed 06/28/2019  . History of kidney stones   . Hyperlipidemia   . Hypertension   . Iron deficiency anemia 06/22/2014  . Junctional bradycardia    a. In setting of beta blocker therapy.  . Leucocytosis 10/19/2015  . Morbid obesity ([place]ampl[place]   . Overactive bladder   . Primary osteoarthritis of right knee 09/01/2016  . Sciatica 01/07/2015    Patient Active Problem List   Diagnosis Date Noted  . Acute hip pain, left 10/23/2019  . Lumbar stenosis with neurogenic claudication 10/23/2019  . COPD with acute exacerbation ([place] 09/28/2019  . Acute diastolic CHF (congestive heart failure) ([place] 09/28/2019  . (HFpEF) heart failure with preserved ejection fraction ([place] 09/27/2019  . Rectal bleeding 06/28/2019  . Hypokalemia 06/28/2019  . Hyponatremia 06/28/2019  . Type II diabetes mellitus with renal manifestations ([place] 06/28/2019  . CKD (chronic kidney disease), stage IIIa 06/28/2019  . Atrial fibrillation, chronic ([place] 06/28/2019  . Pulmonary edema 02/27/2019  . Bradycardia 02/24/2019  . Chronic respiratory failure with hypoxia ([place] 02/23/2019  . Atypical chest pain 02/13/2019  . Osteopenia of neck of left femur 11/20/2018  . AVM (arteriovenous malformation) of small bowel, acquired   . Acute gastric ulcer with hemorrhage   . Chronic diastolic heart failure ([place] 08/22/2018  . Diarrhea 08/22/2018  .  Junctional bradycardia   . Acute on chronic heart failure with preserved ejection fraction (HFpEF) ([place])   . AKI (acute kidney injury) ([place])   . Symptomatic bradycardia 08/05/2018  . Acute on chronic respiratory failure ([place]) 06/25/2018  . 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 diabetes mellitus ([place]) 01/25/2018  . History of non anemic vitamin B12 deficiency 01/25/2018  . Personal history of kidney stones 11/12/2017  . Urge incontinence 11/12/2017  . History of leukocytosis 09/17/2017  . Right ureteral stone 09/15/2017  . Acute GI bleeding   . GI bleed 08/26/2017  .  Arthritis 08/10/2017  . Chronic kidney disease 08/10/2017  . COPD (chronic obstructive pulmonary disease) ([place]) 08/10/2017  . Diabetes mellitus type 2, uncomplicated ([place]) 20/60/1561  . Hypertension 08/10/2017  . Obesity (BMI 35.0-39.9 without comorbidity) 04/11/2017  . Primary osteoarthritis of right knee 09/01/2016  . Leucocytosis 10/19/2015  . 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 Acute on chronic respiratory failure with hypoxia and hypercapnia ([place]) 01/07/2015  . Sciatica 01/07/2015  . Weakness 01/07/2015  . 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 Iron deficiency anemia 06/22/2014  . Microalbuminuria 06/22/2014  . CHF (congestive heart failure) ([place]) 04/20/2014  . Edema, peripheral 04/20/2014    Past Surgical History:  Procedure Laterality Date  . APPENDECTOMY    . CESAREAN SECTION     x3  . CHOLECYSTECTOMY    . COLONOSCOPY WITH PROPOFOL N/A 08/28/2017   Procedure: COLONOSCOPY WITH PROPOFOL;  Surgeon: Lucilla Lame, MD;  Location: [facility] ENDOSCOPY;  Service: Endoscopy;  Laterality: N/A;  . COLONOSCOPY WITH PROPOFOL N/A 08/29/2017   Procedure: COLONOSCOPY WITH PROPOFOL;  Surgeon: Lucilla Lame, MD;  Location: [facility] ENDOSCOPY;  Service: Endoscopy;  Laterality: N/A;  . CYSTOSCOPY W/ URETERAL STENT PLACEMENT Right 09/15/2017   Procedure: CYSTOSCOPY WITH RETROGRADE PYELOGRAM/URETERAL STENT PLACEMENT;  Surgeon: Cleon Gustin, MD;  Location: [facility] ORS;  Service: Urology;  Laterality: Right;  . CYSTOSCOPY/URETEROSCOPY/HOLMIUM LASER/STENT PLACEMENT Right 10/09/2017   Procedure: CYSTOSCOPY/URETEROSCOPY/HOLMIUM LASER/STENT PLACEMENT;  Surgeon: Abbie Sons, MD;  Location: [facility] ORS;  Service: Urology;  Laterality: Right;  right Stent exchange  . ESOPHAGOGASTRODUODENOSCOPY (EGD) WITH PROPOFOL N/A 09/16/2018   Procedure: ESOPHAGOGASTRODUODENOSCOPY (EGD) WITH PROPOFOL;  Surgeon: Lin Landsman, MD;  Location: [facility];  Service: Gastroenterology;  Laterality: N/A;  .  EYE SURGERY      OB History   No obstetric history on file.      Home Medications    Prior to Admission medications   Medication Sig Start Date End Date Taking? Authorizing Provider  acetaminophen (TYLENOL) 500 MG tablet Take 500-1,000 mg by mouth every 6 (six) hours as needed for mild pain or fever.   Yes [provider]  albuterol (VENTOLIN HFA) 108 (90 Base) MCG/ACT inhaler Inhale 2 puffs into the lungs every 6 (six) hours as needed for wheezing or shortness of breath.    Yes [provider]  atorvastatin (LIPITOR) 10 MG tablet Take 10 mg by mouth daily.    Yes [provider]  canagliflozin (INVOKANA) 100 MG TABS tablet Take 100 mg by mouth daily.   Yes [provider]  ELIQUIS 5 MG TABS tablet Take 1 tablet by mouth twice daily 03/11/20  Yes Gollan, Kathlene November, MD  esomeprazole (NEXIUM) 40 MG capsule Take 40 mg by mouth daily. 12/17/18  Yes [provider]  Ferrous Sulfate (IRON) 325 (65 Fe) MG TABS Take 1 tablet by mouth daily. 09/14/17  Yes [provider]  fluticasone (FLONASE) 50  MCG/ACT nasal spray Place 2 sprays into both nostrils daily. 11/15/18  Yes [provider]  Fluticasone-Umeclidin-Vilant 100-62.5-25 MCG/INH AEPB Inhale 1 puff into the lungs daily. 12/25/17  Yes [provider]  insulin lispro (HUMALOG) 100 UNIT/ML injection Inject 0-76 Units into the skin continuous. (via V-Go disposable insulin pump)   Yes [provider]  ipratropium-albuterol (DUONEB) 0.5-2.5 (3) MG/3ML SOLN Inhale 3 mLs into the lungs 4 (four) times daily as needed (shortness of breath or wheezing).    Yes [provider]  losartan-hydrochlorothiazide (HYZAAR) 100-25 MG tablet Take 1 tablet by mouth daily. 01/05/19  Yes [provider]  metFORMIN (GLUCOPHAGE) 500 MG tablet Take 1,000 mg by mouth 2 (two) times daily with a meal.    Yes [provider]  montelukast (SINGULAIR) 10 MG tablet Take 10 mg  by mouth at bedtime.   Yes [provider]  potassium chloride (KLOR-CON) 10 MEQ tablet Take 1 tablet (10 meq) as directed when taking torsemide 10/24/19  Yes Gollan, Kathlene November, MD  vitamin B-12 (CYANOCOBALAMIN) 500 MCG tablet Take 500 mcg by mouth daily.   Yes [provider]  cefdinir (OMNICEF) 300 MG capsule Take 1 capsule (300 mg total) by mouth 2 (two) times daily. Patient not taking: No sig reported 12/05/19   [name], PA-C  Insulin Disposable Pump (V-GO 40) KIT USE VIA WEARABLE INJECTOR ONCE DAILY 11/01/19   [provider]  torsemide (DEMADEX) 20 MG tablet TAKE 2 TABLETS TWICE DAILY AS DIRECTED BASED ON WEIGHT (STOP FUROSEMIDE) 05/03/20   [name], MD    Family History Family History  Problem Relation Age of Onset  . Other Mother        unknown medical history  . Other Father        unknown medical history    Social History Social History   Tobacco Use  . Smoking status: Former Smoker    Packs/day: 1.00    Years: 20.00    Pack years: 20.00    Quit date: 12/04/1992    Years since quitting: 27.4  . Smokeless tobacco: Never Used  Vaping Use  . Vaping Use: Never used  Substance Use Topics  . Alcohol use: No  . Drug use: No     Allergies   Ace inhibitors, Beta adrenergic blockers, Gabapentin, Lisinopril, Lyrica [pregabalin], and Shrimp [shellfish allergy]   Review of Systems Review of Systems  Constitutional: Positive for fatigue. Negative for activity change, appetite change, chills and fever.  HENT: Negative.   Eyes: Negative.   Respiratory: Negative.   Cardiovascular: Negative.   Gastrointestinal: Negative for abdominal pain, constipation, diarrhea, nausea and vomiting.  Genitourinary: Negative for dysuria, frequency, hematuria and urgency.  Skin: Negative.   Neurological: Negative for dizziness, syncope, light-headedness, numbness and headaches.  All other systems reviewed and are negative.    Physical Exam Triage  Vital Signs ED Triage Vitals  Enc Vitals Group     BP 05/08/20 1523 (!) 104/54     Pulse Rate 05/08/20 1523 88     Resp 05/08/20 1523 18     Temp 05/08/20 1523 98.3 F (36.8 C)     Temp Source 05/08/20 1523 Oral     SpO2 05/08/20 1523 98 %     Weight 05/08/20 1518 214 lb 1.1 oz (97.1 kg)     Height 05/08/20 1518 '5\' 3"'  (1.6 m)     Head Circumference --      Peak Flow --  Pain Score 05/08/20 1518 8     Pain Loc --      Pain Edu? --      Excl. in [place]? --    No data found.  Updated Vital Signs BP (!) 104/54 (BP Location: Right Arm)   Pulse 88   Temp 98.3 F (36.8 C) (Oral)   Resp 18   Ht '5\' 3"'  (1.6 m)   Wt 97.1 kg   SpO2 98%   BMI 37.92 kg/m   Visual Acuity Right Eye Distance:   Left Eye Distance:   Bilateral Distance:    Right Eye Near:   Left Eye Near:    Bilateral Near:     Physical Exam Vitals and nursing note reviewed.  Constitutional:      General: She is not in acute distress.    Appearance: Normal appearance. She is not ill-appearing, toxic-appearing or diaphoretic.     Comments: Sitting in the wheelchair conversing appropriately.  Has 3 L nasal cannula in place.  HENT:     Head: Normocephalic and atraumatic.     Nose: Nose normal.     Mouth/Throat:     Mouth: Mucous membranes are moist.     Pharynx: Oropharynx is clear.  Eyes:     Extraocular Movements: Extraocular movements intact.     Pupils: Pupils are equal, round, and reactive to light.  Cardiovascular:     Rate and Rhythm: Normal rate and regular rhythm.     Pulses: Normal pulses.     Heart sounds: Normal heart sounds. No murmur heard. No friction rub. No gallop.   Pulmonary:     Effort: Pulmonary effort is normal. No respiratory distress.     Breath sounds: Normal breath sounds. No stridor. No wheezing, rhonchi or rales.  Musculoskeletal:     Cervical back: Normal range of motion and neck supple.  Skin:    General: Skin is warm and dry.     Capillary Refill: Capillary refill takes  less than 2 seconds.     Findings: No rash.  Neurological:     General: No focal deficit present.     Mental Status: She is alert and oriented to person, place, and time.      UC Treatments / Results  Labs (all labs ordered are listed, but only abnormal results are displayed) Labs Reviewed  GLUCOSE, CAPILLARY - Abnormal; Notable for the following components:      Result Value   Glucose-Capillary 442 (*)    All other components within normal limits    EKG   Radiology No results found.  Procedures Procedures (including critical care time)  Medications Ordered in UC Medications - No data to display  Initial Impression / Assessment and Plan / UC Course  I have reviewed the triage vital signs and the nursing notes.  Pertinent labs & imaging results that were available during my care of the patient were reviewed by me and considered in my medical decision making (see chart for details).   Clinical impression: Persistent hyperglycemia with sugars running from 400-450 for the past 10 days.  Patient denies infection, steroid use, change in diet, or change in insulin regimen.  No identifiable cause for her increased blood sugars.  Treatment plan: 1.  The findings and treatment plan were discussed in detail with the patient and her son.  Both parties were in agreement. 2.  I did indicate that I would try to get in touch with endocrinology.  Unfortunately no one is on-call. 3.  I also indicated I would be happy to do a work-up including labs, however she would probably need a higher level of care in the emergency room so I am just going to advise that she go there now and have them work her up and see whether or not she needs IV fluids, IV insulin, or potentially an admission with an endocrinology consult to better control her blood sugars. 4.  Educational handout provided. 5.  Discharge from care at this time and the son will take her directly [facility].    Final Clinical  Impressions(s) / UC Diagnoses   Final diagnoses:  Hyperglycemia  Type 2 diabetes mellitus without complication, with long-term current use of insulin (HCC)  Obesity (BMI 35.0-39.9 without comorbidity)     Discharge Instructions     Your blood sugar is extremely elevated.  You report that you have had blood sugars between 400-450 for 10 days.  I recommend that you go directly to [facility] emergency room for further evaluation.    ED Prescriptions    None     PDMP not reviewed this encounter.   Verda Cumins, MD 05/08/20 [phone]

## 2020-05-08 NOTE — ED Provider Notes (Signed)
[facility] Emergency Department Provider Note  ____________________________________________  Time seen: Approximately 6:27 PM  I have reviewed the triage vital signs and the nursing notes.   HISTORY  Chief Complaint Hyperglycemia    HPI Karen [name] is a 75 y.o. female with a history of CHF CKD COPD and insulin-dependent diabetes  who sent to the ED due to hyperglycemia.  Patient reports that her blood sugar has been elevated for the past week despite no changes in her insulin regimen, no acute symptoms.  Denies polyuria or polydipsia.  No dizziness or syncope, no chest pain shortness of breath cough fever chills body aches dysuria.  Complains of some mild gradual onset diffuse headache that is developed over the week, otherwise no acute symptoms.  She initially went to urgent care but was sent to the ED for further evaluation     Past Medical History:  Diagnosis Date  . (HFpEF) heart failure with preserved ejection fraction ([place]) 2017   a. 2017 Echo: EF 50%; b. 06/2018 Echo: EF 50-55%; c. 08/2018 Echo: EF 50-55%, Nl RV fxn. RVSP 60.86mHg. Mild BAE. Mild to mod TR.     .Marland KitchenAcute on chronic respiratory failure with hypoxia and hypercapnia (HPierson 01/07/2015  . Anemia   . 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 Asthma   . Cataract   . CHF (congestive heart failure) (HKent   . CKD (chronic kidney disease), stage III (HOt[place] T[place]   . COPD (chronic obstructive pulmonary disease) (HParadise [place]    (1) 06/2018 tobacco use, home 3L oxygen   . Diabetes mellitus without complication (HCC)    (1) A1C 7.7 (06/2018)  . Edema, peripheral 04/20/2014  . GI bleed 06/28/2019  . History of kidney stones   . Hyperlipidemia   . Hypertension   . Iron deficiency anemia 06/22/2014  . Junctional bradycardia    a. In setting of beta blocker therapy.  . Leucocytosis 10/19/2015  . Morbid obesity (HBethel   . Overactive bladder   . Primary osteoarthritis of right knee 09/01/2016  . Sciatica 01/07/2015     Patient  Active Problem List   Diagnosis Date Noted  . Morbid obesity (HAllouez 05/08/2020  . Acute hip pain, left 10/23/2019  . Lumbar stenosis with neurogenic claudication 10/23/2019  . COPD with acute exacerbation (HPark City 09/28/2019  . Acute diastolic CHF (congestive heart failure) (HClimax Springs 09/28/2019  . (HFpEF) heart failure with preserved ejection fraction (HTahoma 09/27/2019  . Rectal bleeding 06/28/2019  . Hypokalemia 06/28/2019  . Hyponatremia 06/28/2019  . Type II diabetes mellitus with renal manifestations (HLiberty 06/28/2019  . CKD (chronic kidney disease), stage IIIa 06/28/2019  . Atrial fibrillation, chronic (HLaughlin [employer]B 06/28/2019  . Pulmonary edema 02/27/2019  . Bradycardia 02/24/2019  . Chronic respiratory failure with hypoxia (HHolt 02/23/2019  . Atypical chest pain 02/13/2019  . Osteopenia of neck of left femur 11/20/2018  . AVM (arteriovenous malformation) of small bowel, acquired   . Acute gastric ulcer with hemorrhage   . Chronic diastolic heart failure (HElmwood Place 08/22/2018  . Diarrhea 08/22/2018  . Junctional bradycardia   . Acute on chronic heart failure with preserved ejection fraction (HFpEF) (HHelen   . AKI (acute kidney injury) (HOak Glen   . Symptomatic bradycardia 08/05/2018  . Acute on chronic respiratory failure (HThe Woodlands 06/25/2018  . 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 diabetes mellitus (HZemple 01/25/2018  . History of non anemic vitamin B12 deficiency 01/25/2018  . Personal history of kidney stones 11/12/2017  . Urge incontinence 11/12/2017  . History of leukocytosis  09/17/2017  . Right ureteral stone 09/15/2017  . Acute GI bleeding   . GI bleed 08/26/2017  . Arthritis 08/10/2017  . Stage 4 chronic kidney disease ([place]) 08/10/2017  . COPD (chronic obstructive pulmonary disease) ([place]) 08/10/2017  . Diabetes mellitus type 2, uncomplicated ([place]) 16/12/9602  . Hypertension 08/10/2017  . Obesity (BMI 35.0-39.9 without comorbidity) 04/11/2017  . Primary osteoarthritis of right  knee 09/01/2016  . Leucocytosis 10/19/2015  . 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 Acute on chronic respiratory failure with hypoxia and hypercapnia ([place]) 01/07/2015  . Sciatica 01/07/2015  . Weakness 01/07/2015  . 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 Iron deficiency anemia 06/22/2014  . Microalbuminuria 06/22/2014  . CHF (congestive heart failure) ([place]) 04/20/2014  . Edema, peripheral 04/20/2014     Past Surgical History:  Procedure Laterality Date  . APPENDECTOMY    . CESAREAN SECTION     x3  . CHOLECYSTECTOMY    . COLONOSCOPY WITH PROPOFOL N/A 08/28/2017   Procedure: COLONOSCOPY WITH PROPOFOL;  Surgeon: Lucilla Lame, MD;  Location[facility] END[facility]COPY;  Service: Endoscopy;  Laterality: N/A;  . COLONOSCOPY WITH PROPOFOL N/A 08/29/2017   Procedure: COLONOSCOPY WITH PROPOFOL;  Surgeon: Lucilla Lame, MD;  Location[facility] ENDOSCOPY;  Service: Endoscopy;  Laterality: N/A;  . CYSTOSCOPY W/ URETERAL STENT PLACEMENT Right 09/15/2017   Procedure: CYSTOSCOPY WITH RETROGRADE PYELOGRAM/URETERAL STENT PLACEMENT;  Surgeon: Cleon Gustin, MD;  Location: [facility] ORS;  Service: Urology;  Laterality: Right;  . CYSTOSCOPY/URETEROSCOPY/HOLMIUM LASER/STENT PLACEMENT Right 10/09/2017   Procedure: CYSTOSCOPY/URETEROSCOPY/HOLMIUM LASER/STENT PLACEMENT;  Surgeon: Abbie Sons, MD;  Location: [facility] ORS;  Service: Urology;  Laterality: Right;  right Stent exchange  . ESOPHAGOGASTRODUODENOSCOPY (EGD) WITH PROPOFOL N/A 09/16/2018   Procedure: ESOPHAGOGASTRODUODENOSCOPY (EGD) WITH PROPOFOL;  Surgeon: Lin Landsman, MD;  Location: [place];  Service: Gastroenterology;  Laterality: N/A;  . EYE SURGERY       Prior to Admission medications   Medication Sig Start Date End Date Taking? Authorizing Provider  acetaminophen (TYLENOL) 500 MG tablet Take 500-1,000 mg by mouth every 6 (six) hours as needed for mild pain or fever.    [provider]  albuterol (VENTOLIN HFA) 108 (90  Base) MCG/ACT inhaler Inhale 2 puffs into the lungs every 6 (six) hours as needed for wheezing or shortness of breath.     [provider]  atorvastatin (LIPITOR) 10 MG tablet Take 10 mg by mouth daily.     [provider]  canagliflozin (INVOKANA) 100 MG TABS tablet Take 100 mg by mouth daily.    [provider]  cefdinir (OMNICEF) 300 MG capsule Take 1 capsule (300 mg total) by mouth 2 (two) times daily. Patient not taking: No sig reported 12/05/19   [name], PA-C  ELIQUIS 5 MG TABS tablet Take 1 tablet by mouth twice daily 03/11/20   [name], MD  esomeprazole (NEXIUM) 40 MG capsule Take 40 mg by mouth daily. 12/17/18   [provider]  Ferrous Sulfate (IRON) 325 (65 Fe) MG TABS Take 1 tablet by mouth daily. 09/14/17   [provider]  fluticasone (FLONASE) 50 MCG/ACT nasal spray Place 2 sprays into both nostrils daily. 11/15/18   [provider]  Fluticasone-Umeclidin-Vilant 100-62.5-25 MCG/INH AEPB Inhale 1 puff into the lungs daily. 12/25/17   [provider]  Insulin Disposable Pump (V-GO 40) KIT USE VIA WEARABLE INJECTOR ONCE DAILY 11/01/19   [provider]  insulin lispro (HUMALOG) 100 UNIT/ML injection Inject 0-76 Units  into the skin continuous. (via V-Go disposable insulin pump)    [provider]  ipratropium-albuterol (DUONEB) 0.5-2.5 (3) MG/3ML SOLN Inhale 3 mLs into the lungs 4 (four) times daily as needed (shortness of breath or wheezing).     [provider]  losartan-hydrochlorothiazide (HYZAAR) 100-25 MG tablet Take 1 tablet by mouth daily. 01/05/19   [provider]  metFORMIN (GLUCOPHAGE) 500 MG tablet Take 1,000 mg by mouth 2 (two) times daily with a meal.     [provider]  montelukast (SINGULAIR) 10 MG tablet Take 10 mg by mouth at bedtime.    [provider]  potassium chloride (KLOR-CON) 10 MEQ tablet Take 1 tablet (10 meq) as directed when taking  torsemide 10/24/19   [name], MD  torsemide (DEMADEX) 20 MG tablet TAKE 2 TABLETS TWICE DAILY AS DIRECTED BASED ON WEIGHT (STOP FUROSEMIDE) 05/03/20   [name], MD  vitamin B-12 (CYANOCOBALAMIN) 500 MCG tablet Take 500 mcg by mouth daily.    [provider]     Allergies Ace inhibitors, Beta adrenergic blockers, Gabapentin, Lisinopril, Lyrica [pregabalin], and Shrimp [shellfish allergy]   Family History  Problem Relation Age of Onset  . Other Mother        unknown medical history  . Other Father        unknown medical history    Social History Social History   Tobacco Use  . Smoking status: Former Smoker    Packs/day: 1.00    Years: 20.00    Pack years: 20.00    Quit date: 12/04/1992    Years since quitting: 27.4  . Smokeless tobacco: Never Used  Vaping Use  . Vaping Use: Never used  Substance Use Topics  . Alcohol use: No  . Drug use: No    Review of Systems  Constitutional:   No fever or chills.  ENT:   No sore throat. No rhinorrhea. Cardiovascular:   No chest pain or syncope. Respiratory:   No dyspnea or cough. Gastrointestinal:   Negative for abdominal pain, vomiting and diarrhea.  Musculoskeletal:   Negative for focal pain or swelling All other systems reviewed and are negative except as documented above in ROS and HPI.  ____________________________________________   PHYSICAL EXAM:  VITAL SIGNS: ED Triage Vitals  Enc Vitals Group     BP 05/08/20 1633 (!) 115/57     Pulse Rate 05/08/20 1633 84     Resp 05/08/20 1633 20     Temp 05/08/20 1633 97.7 F (36.5 C)     Temp Source 05/08/20 1633 Oral     SpO2 05/08/20 1633 94 %     Weight 05/08/20 1629 216 lb (98 kg)     Height 05/08/20 1629 _0  (1.6 m)     Head Circumference --      Peak Flow --      Pain Score 05/08/20 1629 8     Pain Loc --      Pain Edu? --      Excl. in [place]? --     Vital signs reviewed, nursing assessments reviewed.   Constitutional:   Alert and  oriented. Non-toxic appearance. Eyes:   Conjunctivae are normal. EOMI. PERRL. ENT      Head:   Normocephalic and atraumatic.      Nose: Normal.      Mouth/Throat: Normal, moist mucosa.      Neck:   No meningismus. Full ROM. Hematological/Lymphatic/Immunilogical:   No cervical lymphadenopathy. Cardiovascular:  RRR. Symmetric bilateral radial and DP pulses.  No murmurs. Cap refill less than 2 seconds. Respiratory:   Normal respiratory effort without tachypnea/retractions. Breath sounds are clear and equal bilaterally. No wheezes/rales/rhonchi. Gastrointestinal:   Soft and nontender. Non distended. There is no CVA tenderness.  No rebound, rigidity, or guarding.  Musculoskeletal:   Normal range of motion in all extremities. No joint effusions.  No lower extremity tenderness.  No edema. Neurologic:   Normal speech and language.  Motor grossly intact. No acute focal neurologic deficits are appreciated.  Skin:    Skin is warm, dry and intact. No rash noted.  No petechiae, purpura, or bullae.  ____________________________________________    LABS (pertinent positives/negatives) (all labs ordered are listed, but only abnormal results are displayed) Labs Reviewed  BASIC METABOLIC PANEL - Abnormal; Notable for the following components:      Result Value   Sodium 125 (*)    Potassium 3.4 (*)    Chloride 79 (*)    Glucose, Bld 417 (*)    BUN 50 (*)    Creatinine, Ser 1.47 (*)    GFR, Estimated 37 (*)    Anion gap 16 (*)    All other components within normal limits  CBC - Abnormal; Notable for the following components:   WBC 12.3 (*)    RBC 3.80 (*)    Hemoglobin 11.4 (*)    HCT 32.7 (*)    All other components within normal limits  BASIC METABOLIC PANEL - Abnormal; Notable for the following components:   Sodium 127 (*)    Chloride 81 (*)    CO2 35 (*)    Glucose, Bld 276 (*)    BUN 48 (*)    Creatinine, Ser 1.37 (*)    GFR, Estimated 40 (*)    All other components within normal  limits  CBG MONITORING, ED - Abnormal; Notable for the following components:   Glucose-Capillary 407 (*)    All other components within normal limits  CBG MONITORING, ED - Abnormal; Notable for the following components:   Glucose-Capillary 253 (*)    All other components within normal limits  BETA-HYDROXYBUTYRIC ACID  URINALYSIS, COMPLETE (UACMP) WITH MICROSCOPIC  CBG MONITORING, ED   ____________________________________________   EKG    ____________________________________________    RADIOLOGY  No results found.  ____________________________________________   PROCEDURES Procedures  ____________________________________________  DIFFERENTIAL DIAGNOSIS   Dehydration, UTI, electrolyte abnormality, DKA  CLINICAL IMPRESSION / ASSESSMENT AND PLAN / ED COURSE  Medications ordered in the ED: Medications  lactated ringers bolus 1,000 mL (0 mLs Intravenous Stopped 05/08/20 1920)  insulin aspart (novoLOG) injection 10 Units (10 Units Intravenous Given 05/08/20 1800)  potassium chloride SA (KLOR-CON) CR tablet 40 mEq (40 mEq Oral Given 05/08/20 1815)  ondansetron (ZOFRAN) injection 4 mg (4 mg Intravenous Given 05/08/20 1857)    Pertinent labs & imaging results that were available during my care of the patient were reviewed by me and considered in my medical decision making (see chart for details).  AMELITA RISINGER was evaluated in Emergency Department on 05/08/2020 for the symptoms described in the history of present illness. She was evaluated in the context of the global COVID-19 pandemic, which necessitated consideration that the patient might be at risk for infection with the SARS-CoV-2 virus that causes COVID-19. Institutional protocols and algorithms that pertain to the evaluation of patients at risk for COVID-19 are in a state of rapid change based on information released by regulatory bodies including the CDC  and federal and state organizations. These policies and algorithms were  followed during the patient's care in the ED.   Patient presents with hyperglycemia, minimally symptomatic.  Vital signs are unremarkable.  Does not appear to be profoundly dehydrated.  Respiration is stable on her usual home oxygen  Lab panel shows glucose of 400 with a sodium of 125.  Anion gap slightly elevated at 16.  However beta hydroxybutyrate is normal.  Will check urinalysis.  Not consistent with DKA.  I think she has dehydration from hyperglycemia, will give IV fluids and some insulin aspart at which point she could be discharged home with minor adjustment of her insulin regimen until she can follow-up with her endocrinologist.   ----------------------------------------- 10:05 PM on 05/08/2020 -----------------------------------------  Repeat BMP improved.  Stable for discharge.  Steady on her feet.     ____________________________________________   FINAL CLINICAL IMPRESSION(S) / ED DIAGNOSES    Final diagnoses:  Type 2 diabetes mellitus with hyperglycemia, with long-term current use of insulin (HCC)  Morbid obesity (HCC)  Chronic obstructive pulmonary disease, unspecified COPD type ([place])  Chronic diastolic heart failure (HCC)  Chronic respiratory failure with hypoxia (HCC)  Paroxysmal atrial fibrillation (HCC)  Stage 4 chronic kidney disease [facility])     ED Discharge Orders    None      Portions of this note were generated with dragon dictation software. Dictation errors may occur despite best attempts at proofreading.   Carrie Mew, MD 05/08/20 2205

## 2020-05-08 NOTE — ED Notes (Signed)
Patient assisted to edge of bed and given diet ginger ale.

## 2020-05-12 NOTE — Progress Notes (Unsigned)
Cardiology Office Note:    Date:  05/12/2020   ID:  Karen [name], DOB 10-02-1945, MRN [id number]  PCP:  Earlie Counts, FNP  [facility] Cardiologist:  Ida Rogue, MD  [facility] Electrophysiologist:  None   Referring MD: Earlie Counts, FNP   Chief Complaint: Refill follow-up  History of Present Illness:    Karen [name] is a 75 y.o. female with a hx of ***  Past Medical History:  Diagnosis Date  . (HFpEF) heart failure with preserved ejection fraction ([place]) 2017   a. 2017 Echo: EF 50%; b. 06/2018 Echo: EF 50-55%; c. 08/2018 Echo: EF 50-55%, Nl RV fxn. RVSP 60.43mHg. Mild BAE. Mild to mod TR.     .Marland KitchenAcute on chronic respiratory failure with hypoxia and hypercapnia (HDowelltown 01/07/2015  . Anemia   . 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 Asthma   . Cataract   . CHF (congestive heart failure) (HMelrose   . CKD (chronic kidney disease), stage III (HChaffee   . COPD (chronic obstructive pulmonary disease) (HMorley    (1) 06/2018 tobacco use, home 3L oxygen   . Diabetes mellitus without complication (HCC)    (1) A1C 7.7 (06/2018)  . Edema, peripheral 04/20/2014  . GI bleed 06/28/2019  . History of kidney stones   . Hyperlipidemia   . Hypertension   . Iron deficiency anemia 06/22/2014  . Junctional bradycardia    a. In setting of beta blocker therapy.  . Leucocytosis 10/19/2015  . Morbid obesity (HCollins   . Overactive bladder   . Primary osteoarthritis of right knee 09/01/2016  . Sciatica 01/07/2015    Past Surgical History:  Procedure Laterality Date  . APPENDECTOMY    . CESAREAN SECTION     x3  . CHOLECYSTECTOMY    . COLONOSCOPY WITH PROPOFOL N/A 08/28/2017   Procedure: COLONOSCOPY WITH PROPOFOL;  Surgeon: WLucilla Lame MD;  [facility]  Service: Endoscopy;  Laterality: N/A;  . COLONOSCOPY WITH PROPOFOL N/A 08/29/2017   Procedure: COLONOSCOPY WITH PROPOFOL;  Surgeon: WLucilla Lame MD;  [facility];  Service: Endoscopy;  Laterality: N/A;  . CYSTOSCOPY W/ URETERAL  STENT PLACEMENT Right 09/15/2017   Procedure: CYSTOSCOPY WITH RETROGRADE PYELOGRAM/URETERAL STENT PLACEMENT;  Surgeon: MCleon Gustin MD;  Location: [facility] ORS;  Service: Urology;  Laterality: Right;  . CYSTOSCOPY/URETEROSCOPY/HOLMIUM LASER/STENT PLACEMENT Right 10/09/2017   Procedure: CYSTOSCOPY/URETEROSCOPY/HOLMIUM LASER/STENT PLACEMENT;  Surgeon: SAbbie Sons MD;  Location: [facility] ORS;  Service: Urology;  Laterality: Right;  right Stent exchange  . ESOPHAGOGASTRODUODENOSCOPY (EGD) WITH PROPOFOL N/A 09/16/2018   Procedure: ESOPHAGOGASTRODUODENOSCOPY (EGD) WITH PROPOFOL;  Surgeon: VLin Landsman MD;  [facility]  Service: Gastroenterology;  Laterality: N/A;  . EYE SURGERY      Current Medications: No outpatient medications have been marked as taking for the 05/13/20 encounter (Appointment) with BTheora Gianotti NP.     Allergies:   Ace inhibitors, Beta adrenergic blockers, Gabapentin, Lisinopril, Lyrica [pregabalin], and Shrimp [shellfish allergy]   Social History   Socioeconomic History  . Marital status: Married    Spouse name: Not on file  . Number of children: Not on file  . Years of education: Not on file  . Highest education level: Not on file  Occupational History  . Not on file  Tobacco Use  . Smoking status: Former Smoker    Packs/day: 1.00    Years: 20.00    Pack years: 20.00    Quit date: 12/04/1992    Years since quitting:  27.4  . Smokeless tobacco: Never Used  Vaping Use  . Vaping Use: Never used  Substance and Sexual Activity  . Alcohol use: No  . Drug use: No  . Sexual activity: Not Currently  Other Topics Concern  . Not on file  Social History Narrative  . Not on file   Social Determinants of Health   Financial Resource Strain: Not on file  Food Insecurity: Not on file  Transportation Needs: Not on file  Physical Activity: Not on file  Stress: Not on file  Social Connections: Not on file     Family History: The  patient's ***family history includes Other in her father and mother.  ROS:   Please see the history of present illness.    *** All other systems reviewed and are negative.  EKGs/Labs/Other Studies Reviewed:    The following studies were reviewed today: ***  EKG:  EKG is *** ordered today.  The ekg ordered today demonstrates ***  Recent Labs: 06/29/2019: TSH 1.440 09/27/2019: ALT 11 10/02/2019: Magnesium 1.8 12/05/2019: B Natriuretic Peptide 84.6 05/08/2020: BUN 48; Creatinine, Ser 1.37; Hemoglobin 11.4; Platelets 297; Potassium 3.5; Sodium 127  Recent Lipid Panel No results found for: CHOL, TRIG, HDL, CHOLHDL, VLDL, LDLCALC, LDLDIRECT   Risk Assessment/Calculations:   {Does this patient have ATRIAL FIBRILLATION?[phone]}   Physical Exam:    VS:  There were no vitals taken for this visit.    Wt Readings from Last 3 Encounters:  05/08/20 216 lb (98 kg)  05/08/20 214 lb 1.1 oz (97.1 kg)  05/04/20 214 lb (97.1 kg)     GEN: *** Well nourished, well developed in no acute distress HEENT: Normal NECK: No JVD; No carotid bruits LYMPHATICS: No lymphadenopathy CARDIAC: ***RRR, no murmurs, rubs, gallops RESPIRATORY:  Clear to auscultation without rales, wheezing or rhonchi  ABDOMEN: Soft, non-tender, non-distended MUSCULOSKELETAL:  No edema; No deformity  SKIN: Warm and dry NEUROLOGIC:  Alert and oriented x 3 PSYCHIATRIC:  Normal affect   ASSESSMENT:    No diagnosis found. PLAN:    In order of problems listed above:  1. ***  Disposition: Follow up {follow up:15908} with ***   Shared Decision Making/Informed Consent   {Are you ordering a CV Procedure (e.g. stress test, cath, DCCV, TEE, etc)?   Press F2        :[id number]    Signed, Cadence Arlyss Repress  05/12/2020 7:55 PM    [facility]

## 2020-05-13 ENCOUNTER — Ambulatory Visit: Payer: Medicare Other | Admitting: Nurse Practitioner

## 2020-05-13 ENCOUNTER — Encounter: Payer: Self-pay | Admitting: Nurse Practitioner

## 2020-05-14 ENCOUNTER — Encounter: Payer: Self-pay | Admitting: Nurse Practitioner

## 2020-05-21 ENCOUNTER — Emergency Department: Payer: Medicare Other

## 2020-05-21 ENCOUNTER — Other Ambulatory Visit: Payer: Self-pay

## 2020-05-21 ENCOUNTER — Inpatient Hospital Stay
Admission: EM | Admit: 2020-05-21 | Discharge: 2020-05-25 | DRG: 872 | Disposition: A | Discharge status: Home / Self Care | Payer: Medicare Other | Attending: Internal Medicine | Admitting: Internal Medicine

## 2020-05-21 DIAGNOSIS — N179 Acute kidney failure, unspecified: Secondary | ICD-10-CM | POA: Diagnosis present

## 2020-05-21 DIAGNOSIS — I509 Heart failure, unspecified: Secondary | ICD-10-CM | POA: Diagnosis not present

## 2020-05-21 DIAGNOSIS — I110 Hypertensive heart disease with heart failure: Secondary | ICD-10-CM | POA: Diagnosis present

## 2020-05-21 DIAGNOSIS — E871 Hypo-osmolality and hyponatremia: Secondary | ICD-10-CM | POA: Diagnosis present

## 2020-05-21 DIAGNOSIS — I480 Paroxysmal atrial fibrillation: Secondary | ICD-10-CM | POA: Diagnosis present

## 2020-05-21 DIAGNOSIS — N390 Urinary tract infection, site not specified: Secondary | ICD-10-CM | POA: Diagnosis present

## 2020-05-21 DIAGNOSIS — Z91013 Allergy to seafood: Secondary | ICD-10-CM

## 2020-05-21 DIAGNOSIS — Z7984 Long term (current) use of oral hypoglycemic drugs: Secondary | ICD-10-CM

## 2020-05-21 DIAGNOSIS — I959 Hypotension, unspecified: Secondary | ICD-10-CM | POA: Diagnosis present

## 2020-05-21 DIAGNOSIS — Z9981 Dependence on supplemental oxygen: Secondary | ICD-10-CM

## 2020-05-21 DIAGNOSIS — Z8719 Personal history of other diseases of the digestive system: Secondary | ICD-10-CM | POA: Diagnosis not present

## 2020-05-21 DIAGNOSIS — A419 Sepsis, unspecified organism: Secondary | ICD-10-CM | POA: Diagnosis present

## 2020-05-21 DIAGNOSIS — E872 Acidosis, unspecified: Secondary | ICD-10-CM

## 2020-05-21 DIAGNOSIS — R531 Weakness: Secondary | ICD-10-CM

## 2020-05-21 DIAGNOSIS — I10 Essential (primary) hypertension: Secondary | ICD-10-CM | POA: Diagnosis not present

## 2020-05-21 DIAGNOSIS — Z888 Allergy status to other drugs, medicaments and biological substances status: Secondary | ICD-10-CM | POA: Diagnosis not present

## 2020-05-21 DIAGNOSIS — J9612 Chronic respiratory failure with hypercapnia: Secondary | ICD-10-CM

## 2020-05-21 DIAGNOSIS — E669 Obesity, unspecified: Secondary | ICD-10-CM | POA: Diagnosis present

## 2020-05-21 DIAGNOSIS — J9611 Chronic respiratory failure with hypoxia: Secondary | ICD-10-CM | POA: Diagnosis present

## 2020-05-21 DIAGNOSIS — I5032 Chronic diastolic (congestive) heart failure: Secondary | ICD-10-CM | POA: Diagnosis present

## 2020-05-21 DIAGNOSIS — E860 Dehydration: Secondary | ICD-10-CM | POA: Diagnosis present

## 2020-05-21 DIAGNOSIS — J432 Centrilobular emphysema: Secondary | ICD-10-CM | POA: Diagnosis not present

## 2020-05-21 DIAGNOSIS — E1169 Type 2 diabetes mellitus with other specified complication: Secondary | ICD-10-CM | POA: Diagnosis not present

## 2020-05-21 DIAGNOSIS — E119 Type 2 diabetes mellitus without complications: Secondary | ICD-10-CM

## 2020-05-21 DIAGNOSIS — R6520 Severe sepsis without septic shock: Secondary | ICD-10-CM | POA: Diagnosis present

## 2020-05-21 DIAGNOSIS — Z9641 Presence of insulin pump (external) (internal): Secondary | ICD-10-CM | POA: Diagnosis present

## 2020-05-21 DIAGNOSIS — Z20822 Contact with and (suspected) exposure to covid-19: Secondary | ICD-10-CM | POA: Diagnosis present

## 2020-05-21 DIAGNOSIS — E1165 Type 2 diabetes mellitus with hyperglycemia: Secondary | ICD-10-CM

## 2020-05-21 DIAGNOSIS — Z7901 Long term (current) use of anticoagulants: Secondary | ICD-10-CM | POA: Diagnosis not present

## 2020-05-21 DIAGNOSIS — K219 Gastro-esophageal reflux disease without esophagitis: Secondary | ICD-10-CM | POA: Diagnosis not present

## 2020-05-21 DIAGNOSIS — J449 Chronic obstructive pulmonary disease, unspecified: Secondary | ICD-10-CM | POA: Diagnosis present

## 2020-05-21 DIAGNOSIS — I350 Nonrheumatic aortic (valve) stenosis: Secondary | ICD-10-CM | POA: Diagnosis not present

## 2020-05-21 DIAGNOSIS — K7460 Unspecified cirrhosis of liver: Secondary | ICD-10-CM | POA: Diagnosis present

## 2020-05-21 DIAGNOSIS — E876 Hypokalemia: Secondary | ICD-10-CM | POA: Diagnosis present

## 2020-05-21 DIAGNOSIS — Z794 Long term (current) use of insulin: Secondary | ICD-10-CM | POA: Diagnosis not present

## 2020-05-21 DIAGNOSIS — I482 Chronic atrial fibrillation, unspecified: Secondary | ICD-10-CM | POA: Diagnosis present

## 2020-05-21 DIAGNOSIS — J9621 Acute and chronic respiratory failure with hypoxia: Secondary | ICD-10-CM | POA: Diagnosis not present

## 2020-05-21 DIAGNOSIS — E785 Hyperlipidemia, unspecified: Secondary | ICD-10-CM | POA: Diagnosis not present

## 2020-05-21 DIAGNOSIS — R932 Abnormal findings on diagnostic imaging of liver and biliary tract: Secondary | ICD-10-CM

## 2020-05-21 DIAGNOSIS — I5030 Unspecified diastolic (congestive) heart failure: Secondary | ICD-10-CM | POA: Diagnosis present

## 2020-05-21 LAB — URINALYSIS, COMPLETE (UACMP) WITH MICROSCOPIC
Bilirubin Urine: NEGATIVE
Glucose, UA: 50 mg/dL — AB
Ketones, ur: NEGATIVE mg/dL
Nitrite: NEGATIVE
Protein, ur: NEGATIVE mg/dL
Specific Gravity, Urine: 1.013 (ref 1.005–1.030)
WBC, UA: >50 WBC/hpf — ABNORMAL HIGH (ref 0–5)
pH: 5 (ref 5.0–8.0)

## 2020-05-21 LAB — CBC
HCT: 32.8 % — ABNORMAL LOW (ref 36.0–46.0)
Hemoglobin: 11.2 g/dL — ABNORMAL LOW (ref 12.0–15.0)
MCH: 29.7 pg (ref 26.0–34.0)
MCHC: 34.1 g/dL (ref 30.0–36.0)
MCV: 87 fL (ref 80.0–100.0)
Platelets: 292 10*3/uL (ref 150–400)
RBC: 3.77 MIL/uL — ABNORMAL LOW (ref 3.87–5.11)
RDW: 13.5 % (ref 11.5–15.5)
WBC: 12.2 10*3/uL — ABNORMAL HIGH (ref 4.0–10.5)
nRBC: 0 % (ref 0.0–0.2)

## 2020-05-21 LAB — HEPATIC FUNCTION PANEL
ALT: 12 U/L (ref 0–44)
AST: 16 U/L (ref 15–41)
Albumin: 3.5 g/dL (ref 3.5–5.0)
Alkaline Phosphatase: 62 U/L (ref 38–126)
Bilirubin, Direct: <0.1 mg/dL (ref 0.0–0.2)
Total Bilirubin: 0.7 mg/dL (ref 0.3–1.2)
Total Protein: 6.5 g/dL (ref 6.5–8.1)

## 2020-05-21 LAB — BASIC METABOLIC PANEL
Anion gap: 14 (ref 5–15)
BUN: 81 mg/dL — ABNORMAL HIGH (ref 8–23)
CO2: 33 mmol/L — ABNORMAL HIGH (ref 22–32)
Calcium: 9.4 mg/dL (ref 8.9–10.3)
Chloride: 82 mmol/L — ABNORMAL LOW (ref 98–111)
Creatinine, Ser: 1.84 mg/dL — ABNORMAL HIGH (ref 0.44–1.00)
GFR, Estimated: 28 mL/min — ABNORMAL LOW (ref >60)
Glucose, Bld: 274 mg/dL — ABNORMAL HIGH (ref 70–99)
Potassium: 3.4 mmol/L — ABNORMAL LOW (ref 3.5–5.1)
Sodium: 129 mmol/L — ABNORMAL LOW (ref 135–145)

## 2020-05-21 LAB — OSMOLALITY, URINE: Osmolality, Ur: 381 mOsm/kg (ref 300–900)

## 2020-05-21 LAB — LACTIC ACID, PLASMA
Lactic Acid, Venous: 2.9 mmol/L (ref 0.5–1.9)
Lactic Acid, Venous: 3 mmol/L (ref 0.5–1.9)
Lactic Acid, Venous: 2.4 mmol/L (ref 0.5–1.9)

## 2020-05-21 LAB — SODIUM, URINE, RANDOM: Sodium, Ur: 21 mmol/L

## 2020-05-21 LAB — OSMOLALITY: Osmolality: 314 mOsm/kg — ABNORMAL HIGH (ref 275–295)

## 2020-05-21 LAB — RESP PANEL BY RT-PCR (FLU A&B, COVID) ARPGX2
Influenza A by PCR: NEGATIVE
Influenza B by PCR: NEGATIVE
SARS Coronavirus 2 by RT PCR: NEGATIVE

## 2020-05-21 LAB — LIPASE, BLOOD: Lipase: 38 U/L (ref 11–51)

## 2020-05-21 LAB — BRAIN NATRIURETIC PEPTIDE: B Natriuretic Peptide: 25.9 pg/mL (ref 0.0–100.0)

## 2020-05-21 LAB — TROPONIN I (HIGH SENSITIVITY): Troponin I (High Sensitivity): 12 ng/L (ref <18)

## 2020-05-21 LAB — BETA-HYDROXYBUTYRIC ACID: Beta-Hydroxybutyric Acid: 0.24 mmol/L (ref 0.05–0.27)

## 2020-05-21 MED ORDER — LACTATED RINGERS IV BOLUS
1000.0000 mL | Freq: Once | INTRAVENOUS | Status: AC
  Administered 2020-05-21: 1000 mL via INTRAVENOUS

## 2020-05-21 MED ORDER — SODIUM CHLORIDE 0.9 % IV SOLN
2.0000 g | Freq: Once | INTRAVENOUS | Status: AC
  Administered 2020-05-21: 2 g via INTRAVENOUS
  Filled 2020-05-21: qty 2

## 2020-05-21 MED ORDER — SODIUM CHLORIDE 0.9 % IV SOLN
1.0000 g | INTRAVENOUS | Status: AC
  Administered 2020-05-22 – 2020-05-24 (×4): 1 g via INTRAVENOUS
  Filled 2020-05-21: qty 10
  Filled 2020-05-21: qty 1
  Filled 2020-05-21 (×2): qty 10

## 2020-05-21 MED ORDER — INSULIN ASPART 100 UNIT/ML ~~LOC~~ SOLN
0.0000 [IU] | Freq: Every day | SUBCUTANEOUS | Status: DC
  Administered 2020-05-22: 2 [IU] via SUBCUTANEOUS
  Administered 2020-05-22: 3 [IU] via SUBCUTANEOUS
  Administered 2020-05-23: 2 [IU] via SUBCUTANEOUS
  Filled 2020-05-21 (×3): qty 1

## 2020-05-21 MED ORDER — ACETAMINOPHEN 325 MG PO TABS
650.0000 mg | ORAL_TABLET | Freq: Four times a day (QID) | ORAL | Status: DC | PRN
  Administered 2020-05-24 – 2020-05-25 (×3): 650 mg via ORAL
  Filled 2020-05-21 (×3): qty 2

## 2020-05-21 MED ORDER — ACETAMINOPHEN 650 MG RE SUPP: 650.0000 mg | Freq: Four times a day (QID) | RECTAL | Status: DC | PRN

## 2020-05-21 MED ORDER — INSULIN ASPART 100 UNIT/ML ~~LOC~~ SOLN
0.0000 [IU] | Freq: Three times a day (TID) | SUBCUTANEOUS | Status: DC
  Administered 2020-05-22: 5 [IU] via SUBCUTANEOUS
  Administered 2020-05-22: 15 [IU] via SUBCUTANEOUS
  Administered 2020-05-22: 8 [IU] via SUBCUTANEOUS
  Administered 2020-05-23: 11 [IU] via SUBCUTANEOUS
  Administered 2020-05-23: 5 [IU] via SUBCUTANEOUS
  Administered 2020-05-23: 8 [IU] via SUBCUTANEOUS
  Administered 2020-05-24: 5 [IU] via SUBCUTANEOUS
  Administered 2020-05-24: 3 [IU] via SUBCUTANEOUS
  Administered 2020-05-24: 8 [IU] via SUBCUTANEOUS
  Administered 2020-05-25 (×2): 5 [IU] via SUBCUTANEOUS
  Filled 2020-05-21 (×11): qty 1

## 2020-05-21 MED ORDER — ONDANSETRON HCL 4 MG/2ML IJ SOLN: 4.0000 mg | Freq: Four times a day (QID) | INTRAMUSCULAR | Status: DC | PRN

## 2020-05-21 MED ORDER — VANCOMYCIN HCL IN DEXTROSE 1-5 GM/200ML-% IV SOLN
1000.0000 mg | Freq: Once | INTRAVENOUS | Status: AC
  Administered 2020-05-21: 1000 mg via INTRAVENOUS
  Filled 2020-05-21: qty 200

## 2020-05-21 MED ORDER — ONDANSETRON HCL 4 MG PO TABS: 4.0000 mg | ORAL_TABLET | Freq: Four times a day (QID) | ORAL | Status: DC | PRN

## 2020-05-21 MED ORDER — SODIUM CHLORIDE 0.9 % IV BOLUS
1000.0000 mL | Freq: Once | INTRAVENOUS | Status: AC
  Administered 2020-05-21: 1000 mL via INTRAVENOUS

## 2020-05-21 MED ORDER — METRONIDAZOLE IN NACL 5-0.79 MG/ML-% IV SOLN
500.0000 mg | Freq: Once | INTRAVENOUS | Status: AC
  Administered 2020-05-21: 500 mg via INTRAVENOUS
  Filled 2020-05-21: qty 100

## 2020-05-21 MED ORDER — SODIUM CHLORIDE 0.9 % IV SOLN: INTRAVENOUS | Status: DC

## 2020-05-21 MED ORDER — APIXABAN 5 MG PO TABS
5.0000 mg | ORAL_TABLET | Freq: Two times a day (BID) | ORAL | Status: DC
  Administered 2020-05-21 – 2020-05-25 (×8): 5 mg via ORAL
  Filled 2020-05-21 (×8): qty 1

## 2020-05-21 NOTE — ED Notes (Signed)
Pt refuses second IV at this time

## 2020-05-21 NOTE — Progress Notes (Signed)
CODE SEPSIS - PHARMACY COMMUNICATION  **Broad Spectrum Antibiotics should be administered within 1 hour of Sepsis diagnosis**  Time Code Sepsis Called/Page Received: 1652  Antibiotics Ordered: Vancomycin, Cefepime  Time of 1st antibiotic administration: 1818  Additional action taken by pharmacy: none  If necessary, Name of Provider/Nurse Contacted: n/a    Pearla Dubonnet ,PharmD Clinical Pharmacist  05/21/2020  6:21 PM

## 2020-05-21 NOTE — ED Triage Notes (Addendum)
Pt comes with c/o weakness and increased fatigue. Pt states no CP or SOB.  Pt states she just doesn't feel good. Pt states last CBG was 196 at home before coming up here.  Pt states increased confusion and not being able to remember anything.  Current BP-89/46 checked twice. Pt appears a little pale. Pt states increased weakness when trying to ambulate. Pt has some labored breathing noted. Pt wears 3L [place] at home.

## 2020-05-21 NOTE — ED Notes (Signed)
Pt assisted back to bed from restroom. Pt ambulatory with 1 RN assist, and was dyspneic with exertion. Pt sts her dyspnea is baseline. Monitor reconnected and UA sent.  Pt and visitor updated on POC.

## 2020-05-21 NOTE — H&P (Signed)
History and Physical    Karen [name] RZN:[id number] DOB: 1945-03-19 DOA: 05/21/2020  PCP: Earlie Counts, FNP   Patient coming from: Home  I have personally briefly reviewed patient's old medical records in [place]  Chief Complaint: Weakness x4 days  HPI: Karen [name] is a 75 y.o. female with medical history significant for Diabetes on insulin pump, COPD, hypercapnic respiratory failure on home O2 at 3 L, diastolic heart failure, A. 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  She denies cough, chest pain, fever or chills.  Denies nausea, vomiting, abdominal pain or change in bowel habits and denies dysuria.  At baseline, patient functions independently and takes care of all her household chores which she has been unable to do since the onset of symptoms. ED course: Initial blood pressure on arrival was 89/46 with otherwise normal vitals, afebrile, pulse 88 O2 sat 95% on home flow rate of 3 L.  BP improved to 129/54 with hydration.  Blood work significant for leukocytosis of 12,000 with lactic acid 3>2.9.  Hemoglobin 11.2 which is baseline.  Sodium 129, potassium 3.4, creatinine 1.84 up from baseline of 0.94 just 5 months prior.  Blood sugar 274.  Lipase 38.  Venous blood gas with normal pH and PCO2 61.  Urinalysis with pyuria.  COVID and flu negative. EKG as read by me: NSR at 87 with no acute ST-T wave changes Imaging: Chest x-ray with no acute disease CT abdomen and pelvis: No acute findings but enlarged left hepatic lobe question cirrhosis.  Also shows aortic atherosclerosis and prior cholecystectomy  Patient treated with IV fluid bolus as well as broad-spectrum antibiotics, cefepime and vancomycin.  Hospitalist consulted for admission.  Review of Systems:  As per HPI otherwise all other systems on review of systems negative.    Past Medical History:  Diagnosis Date  . (HFpEF) heart failure with preserved ejection fraction ([place])    a. 2017 Echo: EF 50%; b. 06/2018 Echo: EF 50-55%; c. 08/2018 Echo: EF 50-55%, Nl RV fxn; d. 09/2019 Echo: EF 55-60%, no rwma, mild LVH, Gr1 DD, nl RV size/fxn, PASP 63.260mHg. Mildly dil LA. Triv MR. Mod AS (AoV 0.94cm^2 VTI; mean grad 17.3460mg).  . Acute on chronic respiratory failure with hypoxia and hypercapnia ([facility]11/05/2014  . Anemia   . 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 Asthma   . Cataract   . CKD (chronic kidney disease), stage III ([place]  . COPD (chronic obstructive pulmonary disease) ([place]   a. 06/2018 tobacco use, home 3L oxygen   . Diabetes mellitus without complication ([facility]CG[place]   a. 09/2019 A1C 8.8  . Edema, peripheral 04/20/2014  . GI bleed 06/28/2019  . History of kidney stones   . Hyperlipidemia   . Hypertension   . Iron deficiency anemia 06/22/2014  . Junctional bradycardia    a. In setting of beta blocker therapy.  . Leucocytosis 10/19/2015  . Moderate aortic stenosis    a.  09/2019 Echo: Mod AS (AoV 0.94cm^2 VTI; mean grad 17.60m47m).  . Morbid obesity ([place] . Overactive bladder   . Primary osteoarthritis of right knee 09/01/2016  . Sciatica 01/07/2015    Past Surgical History:  Procedure Laterality Date  . APPENDECTOMY    . CESAREAN  SECTION     x3  . CHOLECYSTECTOMY    . COLONOSCOPY WITH PROPOFOL N/A 08/28/2017   Procedure: COLONOSCOPY WITH PROPOFOL;  Surgeon: Lucilla Lame, MD;  Location: [facility] ENDOSCOPY;  Service: Endoscopy;  Laterality: N/A;  . COLONOSCOPY WITH PROPOFOL N/A 08/29/2017   Procedure: COLONOSCOPY WITH PROPOFOL;  Surgeon: Lucilla Lame, MD;  Location: [facility] ENDOSCOPY;  Service: Endoscopy;  Laterality: N/A;  . CYSTOSCOPY W/ URETERAL STENT PLACEMENT Right 09/15/2017   Procedure: CYSTOSCOPY WITH RETROGRADE PYELOGRAM/URETERAL STENT PLACEMENT;  Surgeon: Cleon Gustin, MD;  Location: [facility]  ORS;  Service: Urology;  Laterality: Right;  . CYSTOSCOPY/URETEROSCOPY/HOLMIUM LASER/STENT PLACEMENT Right 10/09/2017   Procedure: CYSTOSCOPY/URETEROSCOPY/HOLMIUM LASER/STENT PLACEMENT;  Surgeon: Abbie Sons, MD;  Location: [facility] ORS;  Service: Urology;  Laterality: Right;  right Stent exchange  . ESOPHAGOGASTRODUODENOSCOPY (EGD) WITH PROPOFOL N/A 09/16/2018   Procedure: ESOPHAGOGASTRODUODENOSCOPY (EGD) WITH PROPOFOL;  Surgeon: Lin Landsman, MD;  Location: [place];  Service: Gastroenterology;  Laterality: N/A;  . EYE SURGERY       reports that she quit smoking about 27 years ago. She has a 20.00 pack-year smoking history. She has never used smokeless tobacco. She reports that she does not drink alcohol and does not use drugs.  Allergies  Allergen Reactions  . Ace Inhibitors Hives  . Beta Adrenergic Blockers     Junctional bradycardia  . Gabapentin Hives  . Lisinopril Hives  . Lyrica [Pregabalin] Hives  . Shrimp [Shellfish Allergy] Swelling    Swelling of the lips    Family History  Problem Relation Age of Onset  . Other Mother        unknown medical history  . Other Father        unknown medical history      Prior to Admission medications   Medication Sig Start Date End Date Taking? Authorizing Provider  acetaminophen (TYLENOL) 500 MG tablet Take 500-1,000 mg by mouth every 6 (six) hours as needed for mild pain or fever.    [provider]  albuterol (VENTOLIN HFA) 108 (90 Base) MCG/ACT inhaler Inhale 2 puffs into the lungs every 6 (six) hours as needed for wheezing or shortness of breath.     [provider]  atorvastatin (LIPITOR) 10 MG tablet Take 10 mg by mouth daily.     [provider]  canagliflozin (INVOKANA) 100 MG TABS tablet Take 100 mg by mouth daily.    [provider]  cefdinir (OMNICEF) 300 MG capsule Take 1 capsule (300 mg total) by mouth 2 (two) times daily. Patient not taking: No sig reported 12/05/19   [name], PA-C  ELIQUIS 5 MG TABS tablet Take 1 tablet by mouth twice daily 03/11/20   [name], MD  esomeprazole (NEXIUM) 40 MG capsule Take 40 mg by mouth daily. 12/17/18   [provider]  Ferrous Sulfate (IRON) 325 (65 Fe) MG TABS Take 1 tablet by mouth daily. 09/14/17   [provider]  fluticasone (FLONASE) 50 MCG/ACT nasal spray Place 2 sprays into both nostrils daily. 11/15/18   [provider]  Fluticasone-Umeclidin-Vilant 100-62.5-25 MCG/INH AEPB Inhale 1 puff into the lungs daily. 12/25/17   [provider]  Insulin Disposable Pump (V-GO 40) KIT USE VIA WEARABLE INJECTOR ONCE DAILY 11/01/19   [provider]  insulin lispro (HUMALOG) 100 UNIT/ML injection Inject 0-76 Units into the skin continuous. (via V-Go disposable insulin pump)    [provider]  ipratropium-albuterol (DUONEB) 0.5-2.5 (3) MG/3ML SOLN Inhale 3 mLs  into the lungs 4 (four) times daily as needed (shortness of breath or wheezing).     [provider]  losartan-hydrochlorothiazide (HYZAAR) 100-25 MG tablet Take 1 tablet by mouth daily. 01/05/19   [provider]  metFORMIN (GLUCOPHAGE) 500 MG tablet Take 1,000 mg by mouth 2 (two) times daily with a meal.     [provider]  montelukast (SINGULAIR) 10 MG tablet Take 10 mg by mouth at bedtime.    [provider]  potassium chloride (KLOR-CON) 10 MEQ tablet Take 1 tablet (10 meq) as directed when taking torsemide 10/24/19   [name], MD  torsemide (DEMADEX) 20 MG tablet TAKE 2 TABLETS TWICE DAILY AS DIRECTED BASED ON WEIGHT (STOP FUROSEMIDE) 05/03/20   [name], MD  vitamin B-12 (CYANOCOBALAMIN) 500 MCG tablet Take 500 mcg by mouth daily.    [provider]    Physical Exam: Vitals:   05/21/20 1622 05/21/20 1625 05/21/20 1700 05/21/20 1840  BP: (!) 89/46  135/62 (!) 129/54  Pulse: 88  73 86  Resp: '20  17 15  ' Temp: 97.8 F (36.6 C)     TempSrc: Oral      SpO2: 95%  99% 100%  Weight:  97.1 kg    Height:  '5\' 3"'  (1.6 m)       Vitals:   05/21/20 1622 05/21/20 1625 05/21/20 1700 05/21/20 1840  BP: (!) 89/46  135/62 (!) 129/54  Pulse: 88  73 86  Resp: '20  17 15  ' Temp: 97.8 F (36.6 C)     TempSrc: Oral     SpO2: 95%  99% 100%  Weight:  97.1 kg    Height:  '5\' 3"'  (1.6 m)        Constitutional: Ill-appearing, but alert oriented x 3 . Not in any apparent distress HEENT:      Head: Normocephalic and atraumatic.         Eyes: PERLA, EOMI, Conjunctivae are normal. Sclera is non-icteric.       Mouth/Throat: Mucous membranes are moist.       Neck: Supple with no signs of meningismus. Cardiovascular: Regular rate and rhythm. No murmurs, gallops, or rubs. 2+ symmetrical distal pulses are present . No JVD. No LE edema Respiratory: Respiratory effort normal .Lungs sounds clear bilaterally. No wheezes, crackles, or rhonchi.  Gastrointestinal: Soft, non tender, and non distended with positive bowel sounds.  Genitourinary: No CVA tenderness. Musculoskeletal: Nontender with normal range of motion in all extremities. No cyanosis, or erythema of extremities. Neurologic:  Face is symmetric. Moving all extremities. No gross focal neurologic deficits . Skin: Skin is warm, dry.  No rash or ulcers Psychiatric: Mood and affect are normal    Labs on Admission: I have personally reviewed following labs and imaging studies  CBC: Recent Labs  Lab 05/21/20 1625  WBC 12.2*  HGB 11.2*  HCT 32.8*  MCV 87.0  PLT 742   Basic Metabolic Panel: Recent Labs  Lab 05/21/20 1625  NA 129*  K 3.4*  CL 82*  CO2 33*  GLUCOSE 274*  BUN 81*  CREATININE 1.84*  CALCIUM 9.4   GFR: Estimated Creatinine Clearance: 28.9 mL/min (A) (by C-G formula based on SCr of 1.84 mg/dL (H)). Liver Function Tests: No results for input(s): AST, ALT, ALKPHOS, BILITOT, PROT, ALBUMIN in the last 168 hours. Recent Labs  Lab 05/21/20 1636  LIPASE 38   No results for  input(s): AMMONIA in the last 168 hours. Coagulation Profile: No results  for input(s): INR, PROTIME in the last 168 hours. Cardiac Enzymes: No results for input(s): CKTOTAL, CKMB, CKMBINDEX, TROPONINI in the last 168 hours. BNP (last 3 results) No results for input(s): PROBNP in the last 8760 hours. HbA1C: No results for input(s): HGBA1C in the last 72 hours. CBG: No results for input(s): GLUCAP in the last 168 hours. Lipid Profile: No results for input(s): CHOL, HDL, LDLCALC, TRIG, CHOLHDL, LDLDIRECT in the last 72 hours. Thyroid Function Tests: No results for input(s): TSH, T4TOTAL, FREET4, T3FREE, THYROIDAB in the last 72 hours. Anemia Panel: No results for input(s): VITAMINB12, FOLATE, FERRITIN, TIBC, IRON, RETICCTPCT in the last 72 hours. Urine analysis:    Component Value Date/Time   COLORURINE YELLOW (A) 05/21/2020 1913   APPEARANCEUR CLOUDY (A) 05/21/2020 1913   APPEARANCEUR Cloudy (A) 09/28/2017 1338   LABSPEC 1.013 05/21/2020 1913   LABSPEC 1.024 04/02/2014 0517   PHURINE 5.0 05/21/2020 1913   GLUCOSEU 50 (A) 05/21/2020 1913   GLUCOSEU >=500 04/02/2014 0517   HGBUR SMALL (A) 05/21/2020 1913   BILIRUBINUR NEGATIVE 05/21/2020 1913   BILIRUBINUR Negative 09/28/2017 1338   BILIRUBINUR Negative 04/02/2014 [place] 05/21/2020 1913   PROTEINUR NEGATIVE 05/21/2020 1913   NITRITE NEGATIVE 05/21/2020 1913   LEUKOCYTESUR LARGE (A) 05/21/2020 1913   LEUKOCYTESUR Trace 04/02/2014 0517    Radiological Exams on Admission: CT ABDOMEN PELVIS WO CONTRAST  Result Date: 05/21/2020 CLINICAL DATA:  Fatigue, abdominal pain EXAM: CT ABDOMEN AND PELVIS WITHOUT CONTRAST TECHNIQUE: Multidetector CT imaging of the abdomen and pelvis was performed following the standard protocol without IV contrast. COMPARISON:  12/05/2019 FINDINGS: Lower chest: Lung bases are clear. No effusions. Heart is normal size. Hepatobiliary: There is enlargement of the left hepatic lobe which can be  related to cirrhosis. Recommend clinical correlation. Prior cholecystectomy. No focal hepatic abnormality. Pancreas: Pancreatic atrophy with fatty replacement. No focal abnormality or ductal dilatation. Spleen: No focal abnormality.  Normal size. Adrenals/Urinary Tract: No adrenal abnormality. No focal renal abnormality. No stones or hydronephrosis. Urinary bladder is unremarkable. Stomach/Bowel: Stomach, large and small bowel grossly unremarkable. Vascular/Lymphatic: Aortic atherosclerosis. No evidence of aneurysm or adenopathy. Reproductive: Uterus and adnexa unremarkable.  No mass. Other: No free fluid or free air. Musculoskeletal: No acute bony abnormality. IMPRESSION: Enlarged left hepatic lobe, question cirrhosis. Recommend clinical correlation. Prior cholecystectomy. Aortic atherosclerosis. No acute findings in the abdomen or pelvis. Electronically Signed   By: Rolm Baptise M.D.   On: 05/21/2020 18:04   DG Chest 2 View  Result Date: 05/21/2020 CLINICAL DATA:  Weakness. EXAM: CHEST - 2 VIEW COMPARISON:  September 27, 2019 FINDINGS: The heart, hila, and mediastinum are normal. No pneumothorax. No nodules or masses. No focal infiltrates. IMPRESSION: No active cardiopulmonary disease. Electronically Signed   By: Dorise Bullion III M.D   On: 05/21/2020 18:09     Assessment/Plan 75 year old female with history of diabetes on insulin pump, COPD, hypercapnic respiratory failure on home O2 at 3 L, diastolic heart failure, A. 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     Severe sepsis suspected ([place])   UTI (urinary tract infection) -Patient presenting with malaise, confusion, fluid responsive hypotension, leukocytosis, lactic acid 3>2.9, and AKI as well as abnormal UA -Continue IV fluids -IV Rocephin -Follow cultures -Monitor  mentation with neurologic checks.  Fall and aspiration precautions  AKI (acute kidney injury) ([place]) -Prerenal and secondary to sepsis and dehydration -Creatinine 1.84, above 0.94, 5 months prior -IV hydration -Monitor renal function and avoid nephrotoxins    Generalized weakness -Secondary to acute illness as well as underlying comorbidities -Consider physical therapy consult in the a.m.    COPD (chronic obstructive pulmonary disease) (HCC) -Not acutely exacerbated -Continue home inhalers with duo nebs as needed    Diabetes mellitus type 2, with hyperglycemia (HCC) -Blood sugar 275 -Sliding scale insulin, pending med rec. -Patient has been on insulin pump.  Presented to the ER 2 weeks prior with blood sugar of 471 and was treated and discharged    Hypertension -Hold losartan HCTZ due to AKI as well as due to soft blood pressures on arrival  Chronic diastolic heart failure ([place]) -Not acutely exacerbated but monitor fluid status in view of fluid resuscitation and the treatment of sepsis -Holding ARB due to AKI.  Not currently on beta-blocker -Hold torsemide due to AKI  Hyponatremia, likely chronic -Sodium 129.  Baseline 125-132 -Suspect chronic and related to heart failure -We will get serum and urine osmolality and urine sodium    Atrial fibrillation, paroxysmal (HCC) -Patient currently in sinus -Continue Eliquis for stroke prevention -Not currently on a rate control agent pending med reconciliation      Chronic respiratory failure with hypercapnia (HCC) -At baseline. 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 This is judged to be reasonable and necessary in order to provide the required intensity of service to ensure the patient's safety given the presenting symptoms, physical exam findings, and initial radiographic and laboratory data in the context of their  Comorbid conditions.   Patient requires inpatient status due to high intensity of service, high risk for further deterioration and high frequency of surveillance required.   I certify that at the point of admission it is my clinical judgment that the patient will require inpatient hospital care spanning beyond [place] MD [facility]     05/21/2020, 7:50 PM

## 2020-05-21 NOTE — ED Notes (Signed)
Pt roomed to RM 06 and placed on cardiac monitor. Warm blankets provided.

## 2020-05-21 NOTE — Progress Notes (Signed)
PHARMACY -  BRIEF ANTIBIOTIC NOTE   Pharmacy has received consult(s) for Vancomycin and Cefepime from an ED provider.  The patient's profile has been reviewed for ht/wt/allergies/indication/available labs.    One time order(s) placed for Vancomycin 1g IV and Cefepime 2g IV x 1 dose each.  Further antibiotics/pharmacy consults should be ordered by admitting physician if indicated.                       Thank you, Pearla Dubonnet 05/21/2020  5:50 PM

## 2020-05-21 NOTE — ED Provider Notes (Addendum)
[facility] Emergency Department Provider Note  ____________________________________________   Event Date/Time   First MD Initiated Contact with Patient 05/21/20 1640     (approximate)  I have reviewed the triage vital signs and the nursing notes.   HISTORY  Chief Complaint Weakness    HPI Karen [name] is a 75 y.o. female  With h/o HFpEF, COPD, HTN, HLD, here with weakness. PT reports that for the past 4 days, she's had progressively worsening generalized weakness. Reports that her sx began gradually and that she has felt generally weak, tired. She has been confused and had a hard time remembering things. Reports she's had some associated abd pain, aching, gnawing, generalized. No vomiting. No diarrhea or constipation. No dysuria. No known sick contacts. No med changes. Reports her BP has been low today but was "okay" yesterday. Of note, was seen and tx for DM with hyperglycemia recently.        Past Medical History:  Diagnosis Date  . (HFpEF) heart failure with preserved ejection fraction ([place])    a. 2017 Echo: EF 50%; b. 06/2018 Echo: EF 50-55%; c. 08/2018 Echo: EF 50-55%, Nl RV fxn; d. 09/2019 Echo: EF 55-60%, no rwma, mild LVH, Gr1 DD, nl RV size/fxn, PASP 63.75mHg. Mildly dil LA. Triv MR. Mod AS (AoV 0.94cm^2 VTI; mean grad 17.3585mg).  . Acute on chronic respiratory failure with hypoxia and hypercapnia (HCTwin Lakes11/05/2014  . Anemia   . 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 Asthma   . Cataract   . CKD (chronic kidney disease), stage III (HCUpland  . COPD (chronic obstructive pulmonary disease) (HCMillerton   a. 06/2018 tobacco use, home 3L oxygen   . Diabetes mellitus without complication (HCDe Beque   a. 09/2019 A1C 8.8  . Edema, peripheral 04/20/2014  . GI bleed 06/28/2019  . History of kidney stones   . Hyperlipidemia   . Hypertension   . Iron deficiency anemia 06/22/2014  . Junctional bradycardia    a. In setting of beta blocker therapy.  . Leucocytosis 10/19/2015  .  Moderate aortic stenosis    a.  09/2019 Echo: Mod AS (AoV 0.94cm^2 VTI; mean grad 17.85m52m).  . Morbid obesity (HCCScotts Valley . Overactive bladder   . Primary osteoarthritis of right knee 09/01/2016  . Sciatica 01/07/2015    Patient Active Problem List   Diagnosis Date Noted  . Morbid obesity (HCCMcGrath3/07/2020  . Acute hip pain, left 10/23/2019  . Lumbar stenosis with neurogenic claudication 10/23/2019  . COPD with acute exacerbation (HCCSyracuse7/25/2021  . Acute diastolic CHF (congestive heart failure) (HCCGlen Ferris7/25/2021  . (HFpEF) heart failure with preserved ejection fraction (HCCQuartzsite7/24/2021  . Rectal bleeding 06/28/2019  . Hypokalemia 06/28/2019  . Hyponatremia 06/28/2019  . Type II diabetes mellitus with renal manifestations (HCCBluetown4/24/2021  . CKD (chronic kidney disease), stage IIIa 06/28/2019  . Atrial fibrillation, chronic (HCCLambert4/24/2021  . Pulmonary edema 02/27/2019  . Bradycardia 02/24/2019  . Chronic respiratory failure with hypoxia (HCCTrainer2/20/2020  . Atypical chest pain 02/13/2019  . Osteopenia of neck of left femur 11/20/2018  . AVM (arteriovenous malformation) of small bowel, acquired   . Acute gastric ulcer with hemorrhage   . Chronic diastolic heart failure (HCCSolomon6/18/2020  . Diarrhea 08/22/2018  . Junctional bradycardia   . Acute on chronic heart failure with preserved ejection fraction (HFpEF) (HCCGrayson . AKI (acute kidney injury) (HCCBaskin . Symptomatic bradycardia 08/05/2018  . Acute on chronic respiratory failure (HCCRochester  06/25/2018  . 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 diabetes mellitus ([place]) 01/25/2018  . History of non anemic vitamin B12 deficiency 01/25/2018  . Personal history of kidney stones 11/12/2017  . Urge incontinence 11/12/2017  . History of leukocytosis 09/17/2017  . Right ureteral stone 09/15/2017  . Acute GI bleeding   . GI bleed 08/26/2017  . Arthritis 08/10/2017  . Stage 4 chronic kidney disease ([place]) 08/10/2017  . COPD (chronic  obstructive pulmonary disease) ([place]) 08/10/2017  . Diabetes mellitus type 2, uncomplicated ([place]) 13/24/4010  . Hypertension 08/10/2017  . Obesity (BMI 35.0-39.9 without comorbidity) 04/11/2017  . Primary osteoarthritis of right knee 09/01/2016  . Leucocytosis 10/19/2015  . 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 Acute on chronic respiratory failure with hypoxia and hypercapnia ([place]) 01/07/2015  . Sciatica 01/07/2015  . Weakness 01/07/2015  . 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 Iron deficiency anemia 06/22/2014  . Microalbuminuria 06/22/2014  . CHF (congestive heart failure) ([place]) 04/20/2014  . Edema, peripheral 04/20/2014    Past Surgical History:  Procedure Laterality Date  . APPENDECTOMY    . CESAREAN SECTION     x3  . CHOLECYSTECTOMY    . COLONOSCOPY WITH PROPOFOL N/A 08/28/2017   Procedure: COLONOSCOPY WITH PROPOFOL;  Surgeon: Lucilla Lame, MD;  Location: [facility] ENDOSCOPY;  Service: Endoscopy;  Laterality: N/A;  . COLONOSCOPY WITH PROPOFOL N/A 08/29/2017   Procedure: COLONOSCOPY WITH PROPOFOL;  Surgeon: Lucilla Lame, MD;  Location[facility] ENDOSCOPY;  Service: Endoscopy;  Laterality: N/A;  . CYSTOSCOPY W/ URETERAL STENT PLACEMENT Right 09/15/2017   Procedure: CYSTOSCOPY WITH RETROGRADE PYELOGRAM/URETERAL STENT PLACEMENT;  Surgeon: Cleon Gustin, MD;  Location: [facility] ORS;  Service: Urology;  Laterality: Right;  . CYSTOSCOPY/URETEROSCOPY/HOLMIUM LASER/STENT PLACEMENT Right 10/09/2017   Procedure: CYSTOSCOPY/URETEROSCOPY/HOLMIUM LASER/STENT PLACEMENT;  Surgeon: Abbie Sons, MD;  Location: [facility] ORS;  Service: Urology;  Laterality: Right;  right Stent exchange  . ESOPHAGOGASTRODUODENOSCOPY (EGD) WITH PROPOFOL N/A 09/16/2018   Procedure: ESOPHAGOGASTRODUODENOSCOPY (EGD) WITH PROPOFOL;  Surgeon: Lin Landsman, MD;  Location: [place];  Service: Gastroenterology;  Laterality: N/A;  . EYE SURGERY      Prior to Admission medications   Medication Sig Start  Date End Date Taking? Authorizing Provider  acetaminophen (TYLENOL) 500 MG tablet Take 500-1,000 mg by mouth every 6 (six) hours as needed for mild pain or fever.    [provider]  albuterol (VENTOLIN HFA) 108 (90 Base) MCG/ACT inhaler Inhale 2 puffs into the lungs every 6 (six) hours as needed for wheezing or shortness of breath.     [provider]  atorvastatin (LIPITOR) 10 MG tablet Take 10 mg by mouth daily.     [provider]  canagliflozin (INVOKANA) 100 MG TABS tablet Take 100 mg by mouth daily.    [provider]  cefdinir (OMNICEF) 300 MG capsule Take 1 capsule (300 mg total) by mouth 2 (two) times daily. Patient not taking: No sig reported 12/05/19   [name], PA-C  ELIQUIS 5 MG TABS tablet Take 1 tablet by mouth twice daily 03/11/20   [name], MD  esomeprazole (NEXIUM) 40 MG capsule Take 40 mg by mouth daily. 12/17/18   [provider]  Ferrous Sulfate (IRON) 325 (65 Fe) MG TABS Take 1 tablet by mouth daily. 09/14/17   [provider]  fluticasone (FLONASE) 50 MCG/ACT nasal spray Place 2 sprays into both nostrils daily. 11/15/18   [provider]  Fluticasone-Umeclidin-Vilant 100-62.5-25 MCG/INH AEPB Inhale 1  puff into the lungs daily. 12/25/17   [provider]  Insulin Disposable Pump (V-GO 40) KIT USE VIA WEARABLE INJECTOR ONCE DAILY 11/01/19   [provider]  insulin lispro (HUMALOG) 100 UNIT/ML injection Inject 0-76 Units into the skin continuous. (via V-Go disposable insulin pump)    [provider]  ipratropium-albuterol (DUONEB) 0.5-2.5 (3) MG/3ML SOLN Inhale 3 mLs into the lungs 4 (four) times daily as needed (shortness of breath or wheezing).     [provider]  losartan-hydrochlorothiazide (HYZAAR) 100-25 MG tablet Take 1 tablet by mouth daily. 01/05/19   [provider]  metFORMIN (GLUCOPHAGE) 500 MG tablet Take 1,000 mg by mouth 2 (two) times daily with a  meal.     [provider]  montelukast (SINGULAIR) 10 MG tablet Take 10 mg by mouth at bedtime.    [provider]  potassium chloride (KLOR-CON) 10 MEQ tablet Take 1 tablet (10 meq) as directed when taking torsemide 10/24/19   [name], MD  torsemide (DEMADEX) 20 MG tablet TAKE 2 TABLETS TWICE DAILY AS DIRECTED BASED ON WEIGHT (STOP FUROSEMIDE) 05/03/20   [name], MD  vitamin B-12 (CYANOCOBALAMIN) 500 MCG tablet Take 500 mcg by mouth daily.    [provider]    Allergies Ace inhibitors, Beta adrenergic blockers, Gabapentin, Lisinopril, Lyrica [pregabalin], and Shrimp [shellfish allergy]  Family History  Problem Relation Age of Onset  . Other Mother        unknown medical history  . Other Father        unknown medical history    Social History Social History   Tobacco Use  . Smoking status: Former Smoker    Packs/day: 1.00    Years: 20.00    Pack years: 20.00    Quit date: 12/04/1992    Years since quitting: 27.4  . Smokeless tobacco: Never Used  Vaping Use  . Vaping Use: Never used  Substance Use Topics  . Alcohol use: No  . Drug use: No    Review of Systems  Review of Systems  Constitutional: Positive for fatigue. Negative for fever.  HENT: Negative for congestion and sore throat.   Eyes: Negative for visual disturbance.  Respiratory: Negative for cough and shortness of breath.   Cardiovascular: Negative for chest pain.  Gastrointestinal: Positive for abdominal pain and nausea. Negative for diarrhea and vomiting.  Genitourinary: Negative for flank pain.  Musculoskeletal: Negative for back pain and neck pain.  Skin: Negative for rash and wound.  Neurological: Positive for weakness.  All other systems reviewed and are negative.    ____________________________________________  PHYSICAL EXAM:      VITAL SIGNS: ED Triage Vitals  Enc Vitals Group     BP 05/21/20 1622 (!) 89/46     Pulse Rate 05/21/20 1622 88     Resp  05/21/20 1622 20     Temp 05/21/20 1622 97.8 F (36.6 C)     Temp Source 05/21/20 1622 Oral     SpO2 05/21/20 1622 95 %     Weight 05/21/20 1625 214 lb (97.1 kg)     Height 05/21/20 1625 '5\' 3"'  (1.6 m)     Head Circumference --      Peak Flow --      Pain Score --      Pain Loc --      Pain Edu? --      Excl. in [place]? --      Physical Exam Vitals and nursing  note reviewed.  Constitutional:      General: She is not in acute distress.    Appearance: She is well-developed.  HENT:     Head: Normocephalic and atraumatic.     Mouth/Throat:     Mouth: Mucous membranes are dry.  Eyes:     Conjunctiva/sclera: Conjunctivae normal.  Cardiovascular:     Rate and Rhythm: Normal rate and regular rhythm.     Heart sounds: Normal heart sounds. No murmur heard. No friction rub.  Pulmonary:     Effort: Pulmonary effort is normal. No respiratory distress.     Breath sounds: Normal breath sounds. No wheezing or rales.  Abdominal:     General: There is no distension.     Palpations: Abdomen is soft.     Tenderness: There is no abdominal tenderness.  Musculoskeletal:     Cervical back: Neck supple.  Skin:    General: Skin is warm.     Capillary Refill: Capillary refill takes less than 2 seconds.  Neurological:     Mental Status: She is alert and oriented to person, place, and time.     Motor: No abnormal muscle tone.       ____________________________________________   LABS (all labs ordered are listed, but only abnormal results are displayed)  Labs Reviewed  BASIC METABOLIC PANEL - Abnormal; Notable for the following components:      Result Value   Sodium 129 (*)    Potassium 3.4 (*)    Chloride 82 (*)    CO2 33 (*)    Glucose, Bld 274 (*)    BUN 81 (*)    Creatinine, Ser 1.84 (*)    GFR, Estimated 28 (*)    All other components within normal limits  CBC - Abnormal; Notable for the following components:   WBC 12.2 (*)    RBC 3.77 (*)    Hemoglobin 11.2 (*)    HCT 32.8  (*)    All other components within normal limits  BLOOD GAS, VENOUS - Abnormal; Notable for the following components:   pCO2, Ven 61 (*)    Bicarbonate 40.5 (*)    Acid-Base Excess 14.7 (*)    All other components within normal limits  LACTIC ACID, PLASMA - Abnormal; Notable for the following components:   Lactic Acid, Venous 2.9 (*)    All other components within normal limits  LACTIC ACID, PLASMA - Abnormal; Notable for the following components:   Lactic Acid, Venous 3.0 (*)    All other components within normal limits  RESP PANEL BY RT-PCR (FLU A&B, COVID) ARPGX2  CULTURE, BLOOD (ROUTINE X 2)  CULTURE, BLOOD (ROUTINE X 2)  LIPASE, BLOOD  URINALYSIS, COMPLETE (UACMP) WITH MICROSCOPIC  BETA-HYDROXYBUTYRIC ACID  URINALYSIS, COMPLETE (UACMP) WITH MICROSCOPIC  HEPATIC FUNCTION PANEL  BRAIN NATRIURETIC PEPTIDE  CBG MONITORING, ED  TROPONIN I (HIGH SENSITIVITY)  TROPONIN I (HIGH SENSITIVITY)    ____________________________________________  EKG: Normal sinus rhythm, ventricular rate 87.  PR 154, QRS 78, QTc 471.  No acute ST elevations or depressions. ________________________________________  RADIOLOGY All imaging, including plain films, CT scans, and ultrasounds, independently reviewed by me, and interpretations confirmed via formal radiology reads.  ED MD interpretation:   CT A/P: Negative CXR: Clear  Official radiology report(s): CT ABDOMEN PELVIS WO CONTRAST  Result Date: 05/21/2020 CLINICAL DATA:  Fatigue, abdominal pain EXAM: CT ABDOMEN AND PELVIS WITHOUT CONTRAST TECHNIQUE: Multidetector CT imaging of the abdomen and pelvis was performed following the standard protocol without IV  contrast. COMPARISON:  12/05/2019 FINDINGS: Lower chest: Lung bases are clear. No effusions. Heart is normal size. Hepatobiliary: There is enlargement of the left hepatic lobe which can be related to cirrhosis. Recommend clinical correlation. Prior cholecystectomy. No focal hepatic abnormality.  Pancreas: Pancreatic atrophy with fatty replacement. No focal abnormality or ductal dilatation. Spleen: No focal abnormality.  Normal size. Adrenals/Urinary Tract: No adrenal abnormality. No focal renal abnormality. No stones or hydronephrosis. Urinary bladder is unremarkable. Stomach/Bowel: Stomach, large and small bowel grossly unremarkable. Vascular/Lymphatic: Aortic atherosclerosis. No evidence of aneurysm or adenopathy. Reproductive: Uterus and adnexa unremarkable.  No mass. Other: No free fluid or free air. Musculoskeletal: No acute bony abnormality. IMPRESSION: Enlarged left hepatic lobe, question cirrhosis. Recommend clinical correlation. Prior cholecystectomy. Aortic atherosclerosis. No acute findings in the abdomen or pelvis. Electronically Signed   By: Rolm Baptise M.D.   On: 05/21/2020 18:04   DG Chest 2 View  Result Date: 05/21/2020 CLINICAL DATA:  Weakness. EXAM: CHEST - 2 VIEW COMPARISON:  September 27, 2019 FINDINGS: The heart, hila, and mediastinum are normal. No pneumothorax. No nodules or masses. No focal infiltrates. IMPRESSION: No active cardiopulmonary disease. Electronically Signed   By: Dorise Bullion III M.D   On: 05/21/2020 18:09    ____________________________________________  PROCEDURES   Procedure(s) performed (including Critical Care):  .Critical Care Performed by: Duffy Bruce, MD Authorized by: Duffy Bruce, MD   Critical care provider statement:    Critical care time (minutes):  35   Critical care time was exclusive of:  Separately billable procedures and treating other patients and teaching time   Critical care was necessary to treat or prevent imminent or life-threatening deterioration of the following conditions:  Cardiac failure, respiratory failure, circulatory failure and sepsis   Critical care was time spent personally by me on the following activities:  Development of treatment plan with patient or surrogate, discussions with consultants, evaluation of  patient's response to treatment, examination of patient, obtaining history from patient or surrogate, ordering and performing treatments and interventions, ordering and review of laboratory studies, ordering and review of radiographic studies, pulse oximetry, re-evaluation of patient's condition and review of old charts   I assumed direction of critical care for this patient from another provider in my specialty: no      ____________________________________________  INITIAL IMPRESSION / MDM / [place] / ED COURSE  As part of my medical decision making, I reviewed the following data within the [place] notes reviewed and incorporated, Old chart reviewed, Notes from prior ED visits, and [place] Controlled Substance Database       *Karen [name] was evaluated in Emergency Department on 05/21/2020 for the symptoms described in the history of present illness. She was evaluated in the context of the global COVID-19 pandemic, which necessitated consideration that the patient might be at risk for infection with the SARS-CoV-2 virus that causes COVID-19. Institutional protocols and algorithms that pertain to the evaluation of patients at risk for COVID-19 are in a state of rapid change based on information released by regulatory bodies including the CDC and federal and state organizations. These policies and algorithms were followed during the patient's care in the ED.  Some ED evaluations and interventions may be delayed as a result of limited staffing during the pandemic.*     Medical Decision Making:  75 yo F here with generalized weakness. Labs show significant lactic acidosis, dehydration with elevated BUN:Cr. Glu 274 but normal AG, Co2 33, no signs  of DKA. CBC with mild leukocytosis. PH normal. CXR reviewed and is unremarkable. CT A/P negative.  Will check UA. Suspect generalized weakness 2/2 dehydration, possibly with component of sepsis given LA elevation though  possible cirrhosis noted on CT as well, which could be elevating LA. Pt given fluids, empiric abx. Will admit.  ____________________________________________  FINAL CLINICAL IMPRESSION(S) / ED DIAGNOSES  Final diagnoses:  Sepsis, due to unspecified organism, unspecified whether acute organ dysfunction present ([place])  Lactic acidosis  Dehydration     MEDICATIONS GIVEN DURING THIS VISIT:  Medications  metroNIDAZOLE (FLAGYL) IVPB 500 mg (has no administration in time range)  vancomycin (VANCOCIN) IVPB 1000 mg/200 mL premix (has no administration in time range)  lactated ringers bolus 1,000 mL (1,000 mLs Intravenous Bolus 05/21/20 1704)  ceFEPIme (MAXIPIME) 2 g in sodium chloride 0.9 % 100 mL IVPB (2 g Intravenous New Bag/Given 05/21/20 1818)  sodium chloride 0.9 % bolus 1,000 mL (1,000 mLs Intravenous Bolus 05/21/20 1818)     ED Discharge Orders    None       Note:  This document was prepared using Dragon voice recognition software and may include unintentional dictation errors.   Duffy Bruce, MD 05/21/20 Si [place]    Duffy Bruce, MD 05/21/20 [phone]

## 2020-05-21 NOTE — Progress Notes (Signed)
[facility]link is following Code Sepsis 

## 2020-05-22 DIAGNOSIS — R531 Weakness: Secondary | ICD-10-CM

## 2020-05-22 DIAGNOSIS — J432 Centrilobular emphysema: Secondary | ICD-10-CM | POA: Diagnosis not present

## 2020-05-22 DIAGNOSIS — I5032 Chronic diastolic (congestive) heart failure: Secondary | ICD-10-CM | POA: Diagnosis not present

## 2020-05-22 DIAGNOSIS — I482 Chronic atrial fibrillation, unspecified: Secondary | ICD-10-CM

## 2020-05-22 DIAGNOSIS — A419 Sepsis, unspecified organism: Secondary | ICD-10-CM | POA: Diagnosis not present

## 2020-05-22 DIAGNOSIS — E860 Dehydration: Secondary | ICD-10-CM

## 2020-05-22 LAB — COMPREHENSIVE METABOLIC PANEL
ALT: 10 U/L (ref 0–44)
AST: 11 U/L — ABNORMAL LOW (ref 15–41)
Albumin: 3.3 g/dL — ABNORMAL LOW (ref 3.5–5.0)
Alkaline Phosphatase: 60 U/L (ref 38–126)
Anion gap: 10 (ref 5–15)
BUN: 62 mg/dL — ABNORMAL HIGH (ref 8–23)
CO2: 32 mmol/L (ref 22–32)
Calcium: 9 mg/dL (ref 8.9–10.3)
Chloride: 92 mmol/L — ABNORMAL LOW (ref 98–111)
Creatinine, Ser: 1.09 mg/dL — ABNORMAL HIGH (ref 0.44–1.00)
GFR, Estimated: 53 mL/min — ABNORMAL LOW (ref >60)
Glucose, Bld: 266 mg/dL — ABNORMAL HIGH (ref 70–99)
Potassium: 3 mmol/L — ABNORMAL LOW (ref 3.5–5.1)
Sodium: 134 mmol/L — ABNORMAL LOW (ref 135–145)
Total Bilirubin: 0.7 mg/dL (ref 0.3–1.2)
Total Protein: 6.5 g/dL (ref 6.5–8.1)

## 2020-05-22 LAB — CBC
HCT: 30.5 % — ABNORMAL LOW (ref 36.0–46.0)
Hemoglobin: 10.1 g/dL — ABNORMAL LOW (ref 12.0–15.0)
MCH: 29.3 pg (ref 26.0–34.0)
MCHC: 33.1 g/dL (ref 30.0–36.0)
MCV: 88.4 fL (ref 80.0–100.0)
Platelets: 262 10*3/uL (ref 150–400)
RBC: 3.45 MIL/uL — ABNORMAL LOW (ref 3.87–5.11)
RDW: 13.7 % (ref 11.5–15.5)
WBC: 10.3 10*3/uL (ref 4.0–10.5)
nRBC: 0 % (ref 0.0–0.2)

## 2020-05-22 LAB — CORTISOL-AM, BLOOD: Cortisol - AM: 6.5 ug/dL — ABNORMAL LOW (ref 6.7–22.6)

## 2020-05-22 LAB — PROTIME-INR
INR: 1.2 (ref 0.8–1.2)
Prothrombin Time: 14.9 seconds (ref 11.4–15.2)

## 2020-05-22 LAB — GLUCOSE, CAPILLARY
Glucose-Capillary: 224 mg/dL — ABNORMAL HIGH (ref 70–99)
Glucose-Capillary: 267 mg/dL — ABNORMAL HIGH (ref 70–99)
Glucose-Capillary: 274 mg/dL — ABNORMAL HIGH (ref 70–99)
Glucose-Capillary: 279 mg/dL — ABNORMAL HIGH (ref 70–99)
Glucose-Capillary: 358 mg/dL — ABNORMAL HIGH (ref 70–99)

## 2020-05-22 LAB — HEMOGLOBIN A1C
Hgb A1c MFr Bld: 11.6 % — ABNORMAL HIGH (ref 4.8–5.6)
Mean Plasma Glucose: 286.22 mg/dL

## 2020-05-22 LAB — MAGNESIUM: Magnesium: 2.2 mg/dL (ref 1.7–2.4)

## 2020-05-22 LAB — PROCALCITONIN: Procalcitonin: 0.12 ng/mL

## 2020-05-22 MED ORDER — INSULIN GLARGINE 100 UNIT/ML ~~LOC~~ SOLN
20.0000 [IU] | Freq: Every day | SUBCUTANEOUS | Status: DC
  Administered 2020-05-22: 20 [IU] via SUBCUTANEOUS
  Filled 2020-05-22 (×2): qty 0.2

## 2020-05-22 MED ORDER — INSULIN ASPART 100 UNIT/ML ~~LOC~~ SOLN
5.0000 [IU] | Freq: Three times a day (TID) | SUBCUTANEOUS | Status: DC
  Administered 2020-05-22 – 2020-05-25 (×9): 5 [IU] via SUBCUTANEOUS
  Filled 2020-05-22 (×9): qty 1

## 2020-05-22 MED ORDER — MONTELUKAST SODIUM 10 MG PO TABS
10.0000 mg | ORAL_TABLET | Freq: Every day | ORAL | Status: DC
  Administered 2020-05-22 – 2020-05-24 (×3): 10 mg via ORAL
  Filled 2020-05-22 (×3): qty 1

## 2020-05-22 MED ORDER — LEVALBUTEROL HCL 0.63 MG/3ML IN NEBU
0.6300 mg | INHALATION_SOLUTION | Freq: Four times a day (QID) | RESPIRATORY_TRACT | Status: DC | PRN
  Administered 2020-05-25: 0.63 mg via RESPIRATORY_TRACT
  Filled 2020-05-22: qty 3

## 2020-05-22 MED ORDER — POTASSIUM CHLORIDE CRYS ER 20 MEQ PO TBCR
40.0000 meq | EXTENDED_RELEASE_TABLET | ORAL | Status: AC
Start: 2020-05-22 — End: 2020-05-22
  Administered 2020-05-22 (×2): 40 meq via ORAL
  Filled 2020-05-22 (×2): qty 2

## 2020-05-22 NOTE — Progress Notes (Signed)
Mobility Specialist - Progress Note   05/22/20 1700  Mobility  Activity Ambulated in hall  Level of Assistance Standby assist, set-up cues, supervision of patient - no hands on  Assistive Device Front wheel walker  Distance Ambulated (ft) 60 ft  Mobility Response Tolerated well  Mobility performed by Mobility specialist  $Mobility charge 1 Mobility    Pre-mobility: 82 HR, 98% SpO2 During mobility: 92 HR, 98% SpO2 Post-mobility: 98 HR, 98% SpO2   Pt sitting EOB upon arrival. Incontinent upon standing. Pt ambulated 60' in hallway on 3L. Mild weakness, mild SOB. Dizziness developed during ambulating, resulting in seated rest break. Pt on 3L home O2 PTA. SBA for transfers.    Kathee Delton Mobility Specialist 05/22/20, 5:26 PM

## 2020-05-22 NOTE — Progress Notes (Addendum)
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    She was found to have a urinary tract infection, blood pressure of 89/56 while in triage, pulse ox of 88% on 3 L of oxygen, sodium 129, potassium 3.4, white blood cell count of 12, lactic acid of 3, creatinine of 1.84 (0.94 in 10/21).   She was given Maxipime, Flagyl and vancomycin, 2 L normal saline bolus.  Of note, she was seen in the ED on 05/08/2020 for hyperglycemia and mild dehydration.  She was given IV fluids and insulin as part and discharged home with recommended follow-up with her endocrinologist.  Subjective: Still feels very weak today.  Asking about when she will be able to start thinking more clearly.    Assessment & Plan:   Principal Problem:   Severe sepsis  -Follow-up urine and blood cultures -continue ceftriaxone  Active Problems:   Generalized weakness/dehydration/hyponatremia/AKI - likely secondary to ongoing poorly controlled sugars -Baseline creatinine appears to be 0.9-1.4 -BUN was 81 and creatinine was 1.84 in the ED suggestive of severe dehydration -Creatinine has improved to 1.06 after aggressive fluids -Continue normal saline today but cut down to 50 cc an hour from 100 cc an hour and continue to make attempts to control her glucose -As needed torsemide is on hold - ?  If UTI is related to Invokana  Diabetes mellitus type 2 uncontrolled with hyperglycemia -Hemoglobin A1c is 11.6 -She takes Invokana, Metformin and insulin lispro and her insulin pump -Start Lantus 20 units  daily and 5 units of NovoLog with each meal and follow sugars    COPD chronic hypoxic respiratory failure -No wheezing noted-continue 3 L of oxygen  Atrial fibrillation, paroxysmal -Continue Eliquis-Oertli normal sinus rhythm  Hypotension with a history of hypertension -Losartan/HCTZ on hold due to hypotension  Hypokalemia -Continue to replace -Check magnesium    Time spent in minutes: 40 DVT prophylaxis: Eliquis Code Status: Full code Family Communication:  Level of Care: Level of care: Progressive Cardiac Disposition Plan:  Status is: Inpatient  Remains inpatient appropriate because:IV treatments appropriate due to intensity of illness or inability to take PO   Dispo: The patient is from: Home              Anticipated d/c is to: Home              Patient currently is not medically stable to d/c.   Difficult to place patient No   Consultants:   None Procedures:   None Antimicrobials:  Anti-infectives (From admission, onward)   Start     Dose/Rate Route Frequency Ordered Stop   05/21/20 2345  cefTRIAXone (ROCEPHIN) 1 g in sodium chloride 0.9 % 100 mL IVPB        1 g 200 mL/hr over 30 Minutes Intravenous Every 24 hours 05/21/20 2251     05/21/20 1800  ceFEPIme (MAXIPIME) 2 g in sodium chloride 0.9 % 100 mL IVPB        2 g 200 mL/hr over 30 Minutes Intravenous  Once 05/21/20 1746 05/21/20 1920   05/21/20  1800  metroNIDAZOLE (FLAGYL) IVPB 500 mg        500 mg 100 mL/hr over 60 Minutes Intravenous  Once 05/21/20 1746 05/21/20 2052   05/21/20 1800  vancomycin (VANCOCIN) IVPB 1000 mg/200 mL premix        1,000 mg 200 mL/hr over 60 Minutes Intravenous  Once 05/21/20 1746 05/21/20 2052       Objective: Vitals:   05/22/20 0336 05/22/20 0612 05/22/20 0745 05/22/20 1127  BP: (!) 126/54  137/65 138/69  Pulse: 78  89 78  Resp: '18  18 18  '$ Temp: 98.4 F (36.9 C)  98.3 F (36.8 C) 97.8 F (36.6 C)  TempSrc: Oral  Oral   SpO2: 99%  98% 99%  Weight:  96.6 kg     Height:        Intake/Output Summary (Last 24 hours) at 05/22/2020 1406 Last data filed at 05/22/2020 1343 Gross per 24 hour  Intake 1537.58 ml  Output 1450 ml  Net 87.58 ml   Filed Weights   05/21/20 1625 05/22/20 0612  Weight: 97.1 kg 96.6 kg    Examination: General exam: Appears comfortable  HEENT: PERRLA, oral mucosa moist, no sclera icterus or thrush Respiratory system: Clear to auscultation. Respiratory effort normal. Cardiovascular system: S1 & S2 heard, RRR.   Gastrointestinal system: Abdomen soft, non-tender, nondistended. Normal bowel sounds. Central nervous system: Alert and oriented. No focal neurological deficits. Extremities: No cyanosis, clubbing or edema Skin: No rashes or ulcers Psychiatry: Flat affect  Data Reviewed: I have personally reviewed following labs and imaging studies  CBC: Recent Labs  Lab 05/21/20 1625 05/22/20 0524  WBC 12.2* 10.3  HGB 11.2* 10.1*  HCT 32.8* 30.5*  MCV 87.0 88.4  PLT 292 [id number]   Basic Metabolic Panel: Recent Labs  Lab 05/21/20 1625 05/22/20 0524  NA 129* 134*  K 3.4* 3.0*  CL 82* 92*  CO2 33* 32  GLUCOSE 274* 266*  BUN 81* 62*  CREATININE 1.84* 1.09*  CALCIUM 9.4 9.0   GFR: Estimated Creatinine Clearance: 48.6 mL/min (A) (by C-G formula based on SCr of 1.09 mg/dL (H)). Liver Function Tests: Recent Labs  Lab 05/21/20 2035 05/22/20 0524  AST 16 11*  ALT 12 10  ALKPHOS 62 60  BILITOT 0.7 0.7  PROT 6.5 6.5  ALBUMIN 3.5 3.3*   Recent Labs  Lab 05/21/20 1636  LIPASE 38   No results for input(s): AMMONIA in the last 168 hours. Coagulation Profile: Recent Labs  Lab 05/22/20 0524  INR 1.2   Cardiac Enzymes: No results for input(s): CKTOTAL, CKMB, CKMBINDEX, TROPONINI in the last 168 hours. BNP (last 3 results) No results for input(s): PROBNP in the last 8760 hours. HbA1C: Recent Labs    05/22/20 0524  HGBA1C 11.6*   CBG: Recent Labs  Lab 05/22/20 0002 05/22/20 0744 05/22/20 1126  GLUCAP  224* 279* 358*   Lipid Profile: No results for input(s): CHOL, HDL, LDLCALC, TRIG, CHOLHDL, LDLDIRECT in the last 72 hours. Thyroid Function Tests: No results for input(s): TSH, T4TOTAL, FREET4, T3FREE, THYROIDAB in the last 72 hours. Anemia Panel: No results for input(s): VITAMINB12, FOLATE, FERRITIN, TIBC, IRON, RETICCTPCT in the last 72 hours. Urine analysis:    Component Value Date/Time   COLORURINE YELLOW (A) 05/21/2020 1913   APPEARANCEUR CLOUDY (A) 05/21/2020 1913   APPEARANCEUR Cloudy (A) 09/28/2017 1338   LABSPEC 1.013 05/21/2020 1913   LABSPEC 1.024 04/02/2014 0517   PHURINE 5.0 05/21/2020 1913   GLUCOSEU 50 (A) 05/21/2020 1913  GLUCOSEU >=500 04/02/2014 0517   HGBUR SMALL (A) 05/21/2020 1913   BILIRUBINUR NEGATIVE 05/21/2020 1913   BILIRUBINUR Negative 09/28/2017 [place] Negative 04/02/2014 [place] 05/21/2020 1913   PROTEINUR NEGATIVE 05/21/2020 1913   NITRITE NEGATIVE 05/21/2020 1913   LEUKOCYTESUR LARGE (A) 05/21/2020 1913   LEUKOCYTESUR Trace 04/02/2014 0517   Sepsis Labs: '@LABRCNTIP'$ (procalcitonin:4,lacticidven:4) ) Recent Results (from the past 240 hour(s))  Blood culture (routine x 2)     Status: None (Preliminary result)   Collection Time: 05/21/20  4:56 PM   Specimen: BLOOD  Result Value Ref Range Status   Specimen Description BLOOD BLOOD RIGHT ARM  Final   Special Requests   Final    BOTTLES DRAWN AEROBIC AND ANAEROBIC Blood Culture adequate volume   Culture   Final    NO GROWTH < 12 HOURS Performed at [facility], [address] [place], [place] [address]    Report Status PENDING  Incomplete  Blood culture (routine x 2)     Status: None (Preliminary result)   Collection Time: 05/21/20  4:56 PM   Specimen: BLOOD  Result Value Ref Range Status   Specimen Description BLOOD BLOOD LEFT ARM  Final   Special Requests   Final    BOTTLES DRAWN AEROBIC AND ANAEROBIC Blood Culture adequate volume   Culture   Final     NO GROWTH < 12 HOURS Performed at [facility], [address] [place], [place] [address]    Report Status PENDING  Incomplete  Resp Panel by RT-PCR (Flu A&B, Covid) Nasopharyngeal Swab     Status: None   Collection Time: 05/21/20  6:28 PM   Specimen: Nasopharyngeal Swab; Nasopharyngeal(NP) swabs in vial transport medium  Result Value Ref Range Status   SARS Coronavirus 2 by RT PCR NEGATIVE NEGATIVE Final    Comment: (NOTE) SARS-CoV-2 target nucleic acids are NOT DETECTED.  The SARS-CoV-2 RNA is generally detectable in upper respiratory specimens during the acute phase of infection. The lowest concentration of SARS-CoV-2 viral copies this assay can detect is 138 copies/mL. A negative result does not preclude SARS-Cov-2 infection and should not be used as the sole basis for treatment or other patient management decisions. A negative result may occur with  improper specimen collection/handling, submission of specimen other than nasopharyngeal swab, presence of viral mutation(s) within the areas targeted by this assay, and inadequate number of viral copies(<138 copies/mL). A negative result must be combined with clinical observations, patient history, and epidemiological information. The expected result is Negative.  Fact Sheet for Patients:  EntrepreneurPulse.com.au  Fact Sheet for Healthcare Providers:  IncredibleEmployment.be  This test is no t yet approved or cleared by the [place] FDA and  has been authorized for detection and/or diagnosis of SARS-CoV-2 by FDA under an Emergency Use Authorization (EUA). This EUA will remain  in effect (meaning this test can be used) for the duration of the COVID-19 declaration under Section 564(b)(1) of the Act, 21 U.S.C.section 360bbb-3(b)(1), unless the authorization is terminated  or revoked sooner.       Influenza A by PCR NEGATIVE NEGATIVE Final   Influenza B by PCR NEGATIVE  NEGATIVE Final    Comment: (NOTE) The Xpert Xpress SARS-CoV-2/FLU/RSV plus assay is intended as an aid in the diagnosis of influenza from Nasopharyngeal swab specimens and should not be used as a sole basis for treatment. Nasal washings and aspirates are unacceptable for Xpert Xpress SARS-CoV-2/FLU/RSV testing.  Fact Sheet for Patients: EntrepreneurPulse.com.au  Fact Sheet for Healthcare Providers: IncredibleEmployment.be  This test is not yet approved or cleared by the [place] FDA and has been authorized for detection and/or diagnosis of SARS-CoV-2 by FDA under an Emergency Use Authorization (EUA). This EUA will remain in effect (meaning this test can be used) for the duration of the COVID-19 declaration under Section 564(b)(1) of the Act, 21 U.S.C. section 360bbb-3(b)(1), unless the authorization is terminated or revoked.  Performed at [facility], [address] [place], [place] [address]          Radiology Studies: CT ABDOMEN PELVIS WO CONTRAST  Result Date: 05/21/2020 CLINICAL DATA:  Fatigue, abdominal pain EXAM: CT ABDOMEN AND PELVIS WITHOUT CONTRAST TECHNIQUE: Multidetector CT imaging of the abdomen and pelvis was performed following the standard protocol without IV contrast. COMPARISON:  12/05/2019 FINDINGS: Lower chest: Lung bases are clear. No effusions. Heart is normal size. Hepatobiliary: There is enlargement of the left hepatic lobe which can be related to cirrhosis. Recommend clinical correlation. Prior cholecystectomy. No focal hepatic abnormality. Pancreas: Pancreatic atrophy with fatty replacement. No focal abnormality or ductal dilatation. Spleen: No focal abnormality.  Normal size. Adrenals/Urinary Tract: No adrenal abnormality. No focal renal abnormality. No stones or hydronephrosis. Urinary bladder is unremarkable. Stomach/Bowel: Stomach, large and small bowel grossly unremarkable. Vascular/Lymphatic: Aortic  atherosclerosis. No evidence of aneurysm or adenopathy. Reproductive: Uterus and adnexa unremarkable.  No mass. Other: No free fluid or free air. Musculoskeletal: No acute bony abnormality. IMPRESSION: Enlarged left hepatic lobe, question cirrhosis. Recommend clinical correlation. Prior cholecystectomy. Aortic atherosclerosis. No acute findings in the abdomen or pelvis. Electronically Signed   By: Rolm Baptise M.D.   On: 05/21/2020 18:04   DG Chest 2 View  Result Date: 05/21/2020 CLINICAL DATA:  Weakness. EXAM: CHEST - 2 VIEW COMPARISON:  September 27, 2019 FINDINGS: The heart, hila, and mediastinum are normal. No pneumothorax. No nodules or masses. No focal infiltrates. IMPRESSION: No active cardiopulmonary disease. Electronically Signed   By: Dorise Bullion III M.D   On: 05/21/2020 18:09      Scheduled Meds: . apixaban  5 mg Oral BID  . insulin aspart  0-15 Units Subcutaneous TID WC  . insulin aspart  0-5 Units Subcutaneous QHS  . montelukast  10 mg Oral QHS   Continuous Infusions: . sodium chloride 100 mL/hr at 05/22/20 0906  . cefTRIAXone (ROCEPHIN)  IV 1 g (05/22/20 0014)     LOS: 1 day      Debbe Odea, MD [facility] Pager: [email or web address] 05/22/2020, 2:06 PM

## 2020-05-23 DIAGNOSIS — R531 Weakness: Secondary | ICD-10-CM | POA: Diagnosis not present

## 2020-05-23 DIAGNOSIS — E1165 Type 2 diabetes mellitus with hyperglycemia: Secondary | ICD-10-CM

## 2020-05-23 DIAGNOSIS — I482 Chronic atrial fibrillation, unspecified: Secondary | ICD-10-CM | POA: Diagnosis not present

## 2020-05-23 DIAGNOSIS — E669 Obesity, unspecified: Secondary | ICD-10-CM

## 2020-05-23 DIAGNOSIS — A419 Sepsis, unspecified organism: Secondary | ICD-10-CM | POA: Diagnosis not present

## 2020-05-23 DIAGNOSIS — E872 Acidosis: Secondary | ICD-10-CM

## 2020-05-23 DIAGNOSIS — E871 Hypo-osmolality and hyponatremia: Secondary | ICD-10-CM

## 2020-05-23 DIAGNOSIS — E860 Dehydration: Secondary | ICD-10-CM | POA: Diagnosis not present

## 2020-05-23 DIAGNOSIS — Z794 Long term (current) use of insulin: Secondary | ICD-10-CM

## 2020-05-23 LAB — CBC
HCT: 31 % — ABNORMAL LOW (ref 36.0–46.0)
Hemoglobin: 10 g/dL — ABNORMAL LOW (ref 12.0–15.0)
MCH: 29.3 pg (ref 26.0–34.0)
MCHC: 32.3 g/dL (ref 30.0–36.0)
MCV: 90.9 fL (ref 80.0–100.0)
Platelets: 248 10*3/uL (ref 150–400)
RBC: 3.41 MIL/uL — ABNORMAL LOW (ref 3.87–5.11)
RDW: 13.5 % (ref 11.5–15.5)
WBC: 9.9 10*3/uL (ref 4.0–10.5)
nRBC: 0 % (ref 0.0–0.2)

## 2020-05-23 LAB — URINE CULTURE

## 2020-05-23 LAB — BASIC METABOLIC PANEL
Anion gap: 8 (ref 5–15)
BUN: 31 mg/dL — ABNORMAL HIGH (ref 8–23)
CO2: 28 mmol/L (ref 22–32)
Calcium: 9.3 mg/dL (ref 8.9–10.3)
Chloride: 98 mmol/L (ref 98–111)
Creatinine, Ser: 0.87 mg/dL (ref 0.44–1.00)
GFR, Estimated: >60 mL/min (ref >60)
Glucose, Bld: 296 mg/dL — ABNORMAL HIGH (ref 70–99)
Potassium: 4.7 mmol/L (ref 3.5–5.1)
Sodium: 134 mmol/L — ABNORMAL LOW (ref 135–145)

## 2020-05-23 LAB — GLUCOSE, CAPILLARY
Glucose-Capillary: 295 mg/dL — ABNORMAL HIGH (ref 70–99)
Glucose-Capillary: 338 mg/dL — ABNORMAL HIGH (ref 70–99)
Glucose-Capillary: 248 mg/dL — ABNORMAL HIGH (ref 70–99)
Glucose-Capillary: 237 mg/dL — ABNORMAL HIGH (ref 70–99)

## 2020-05-23 MED ORDER — INSULIN GLARGINE 100 UNIT/ML ~~LOC~~ SOLN
30.0000 [IU] | Freq: Every day | SUBCUTANEOUS | Status: DC
  Administered 2020-05-23: 30 [IU] via SUBCUTANEOUS
  Filled 2020-05-23 (×2): qty 0.3

## 2020-05-23 MED ORDER — VITAMIN B-12 1000 MCG PO TABS
500.0000 ug | ORAL_TABLET | Freq: Every day | ORAL | Status: DC
  Administered 2020-05-23 – 2020-05-25 (×3): 500 ug via ORAL
  Filled 2020-05-23 (×3): qty 1

## 2020-05-23 MED ORDER — METFORMIN HCL 500 MG PO TABS
1000.0000 mg | ORAL_TABLET | Freq: Two times a day (BID) | ORAL | Status: DC
Start: 2020-05-23 — End: 2020-05-25
  Administered 2020-05-23 – 2020-05-25 (×4): 1000 mg via ORAL
  Filled 2020-05-23 (×4): qty 2

## 2020-05-23 MED ORDER — INSULIN GLARGINE 100 UNIT/ML ~~LOC~~ SOLN
26.0000 [IU] | Freq: Every day | SUBCUTANEOUS | Status: DC
  Filled 2020-05-23: qty 0.26

## 2020-05-23 NOTE — Progress Notes (Signed)
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    She was found to have a urinary tract infection, blood pressure of 89/56 while in triage, pulse ox of 88% on 3 L of oxygen, sodium 129, potassium 3.4, white blood cell count of 12, lactic acid of 3, creatinine of 1.84 (0.94 in 10/21).   She was given Maxipime, Flagyl and vancomycin, 2 L normal saline bolus.  Of note, she was seen in the ED on 05/08/2020 for hyperglycemia and mild dehydration.  She was given IV fluids and insulin as part and discharged home with recommended follow-up with her endocrinologist.  Subjective: Remains quite weak.     Assessment & Plan:   Principal Problem:   Severe sepsis  -Urine culture was contaminated and showing multiple species- no poinit in rechecking now -continue ceftriaxone for now- will need 7 days total of antibiotics  Active Problems:   Generalized weakness/dehydration/hyponatremia/AKI - likely secondary to ongoing poorly controlled sugars -Baseline creatinine appears to be 0.9-1.4 -BUN was 81 and creatinine was 1.84 in the ED suggestive of severe dehydration -Creatinine has improved to 1.06 after aggressive fluids -Continue normal saline at 50 cc an hour and continue to make attempts to control her glucose -As needed torsemide is on hold - ?  If UTI is related to Invokana  Diabetes mellitus type 2 uncontrolled with hyperglycemia -Hemoglobin A1c is 11.6 -She takes Invokana, Metformin and insulin lispro and her insulin pump -Start Lantus 20 units daily and 5  units of NovoLog with each meal and follow sugars - increase Lantus today to 30 U - resume metformin    COPD chronic hypoxic respiratory failure -No wheezing noted-continue 3 L of oxygen  Atrial fibrillation, paroxysmal -Continue Eliquis-Oertli normal sinus rhythm  Hypotension with a history of hypertension -Losartan/HCTZ on hold due to hypotension  Hypokalemia -Continue to replace -Check magnesium    Time spent in minutes: 30 DVT prophylaxis: Eliquis Code Status: Full code Family Communication:  Level of Care: Level of care: Progressive Cardiac Disposition Plan:  Status is: Inpatient  Remains inpatient appropriate because:IV treatments appropriate due to intensity of illness or inability to take PO   Dispo: The patient is from: Home              Anticipated d/c is to: Home              Patient currently is not medically stable to d/c.   Difficult to place patient No   Consultants:   None Procedures:   None Antimicrobials:  Anti-infectives (From admission, onward)   Start     Dose/Rate Route Frequency Ordered Stop   05/21/20 2345  cefTRIAXone (ROCEPHIN) 1 g in sodium chloride 0.9 % 100 mL IVPB        1 g 200 mL/hr over 30 Minutes Intravenous Every 24 hours 05/21/20 2251     05/21/20 1800  ceFEPIme (MAXIPIME) 2 g in sodium chloride 0.9 % 100 mL IVPB        2 g 200 mL/hr over 30 Minutes Intravenous  Once 05/21/20 1746 05/21/20  1920   05/21/20 1800  metroNIDAZOLE (FLAGYL) IVPB 500 mg        500 mg 100 mL/hr over 60 Minutes Intravenous  Once 05/21/20 1746 05/21/20 2052   05/21/20 1800  vancomycin (VANCOCIN) IVPB 1000 mg/200 mL premix        1,000 mg 200 mL/hr over 60 Minutes Intravenous  Once 05/21/20 1746 05/21/20 2052       Objective: Vitals:   05/23/20 0303 05/23/20 0303 05/23/20 0748 05/23/20 1158  BP: (!) 143/45 (!) 143/45 131/67 (!) 141/45  Pulse: (!) 59 (!) 59 (!) 51 (!) 54  Resp: '18 18 18 17  '$ Temp: 98 F (36.7 C) 98 F (36.7 C) 97.8 F (36.6  C) 97.7 F (36.5 C)  TempSrc: Oral Oral Oral   SpO2:  98% 97% 97%  Weight:      Height:        Intake/Output Summary (Last 24 hours) at 05/23/2020 1425 Last data filed at 05/23/2020 0858 Gross per 24 hour  Intake 740 ml  Output 1550 ml  Net -810 ml   Filed Weights   05/21/20 1625 05/22/20 0612  Weight: 97.1 kg 96.6 kg    Examination: General exam: Appears comfortable  HEENT: PERRLA, oral mucosa moist, no sclera icterus or thrush Respiratory system: Clear to auscultation. Respiratory effort normal. Cardiovascular system: S1 & S2 heard, regular rate and rhythm Gastrointestinal system: Abdomen soft, non-tender, nondistended. Normal bowel sounds   Central nervous system: Alert and oriented. No focal neurological deficits. Extremities: No cyanosis, clubbing or edema Skin: No rashes or ulcers Psychiatry:  Mood & affect appropriate.   Data Reviewed: I have personally reviewed following labs and imaging studies  CBC: Recent Labs  Lab 05/21/20 1625 05/22/20 0524 05/23/20 0506  WBC 12.2* 10.3 9.9  HGB 11.2* 10.1* 10.0*  HCT 32.8* 30.5* 31.0*  MCV 87.0 88.4 90.9  PLT 292 262 [id number]   Basic Metabolic Panel: Recent Labs  Lab 05/21/20 1625 05/22/20 0524 05/23/20 0506  NA 129* 134* 134*  K 3.4* 3.0* 4.7  CL 82* 92* 98  CO2 33* 32 28  GLUCOSE 274* 266* 296*  BUN 81* 62* 31*  CREATININE 1.84* 1.09* 0.87  CALCIUM 9.4 9.0 9.3  MG  --  2.2  --    GFR: Estimated Creatinine Clearance: 60.9 mL/min (by C-G formula based on SCr of 0.87 mg/dL). Liver Function Tests: Recent Labs  Lab 05/21/20 2035 05/22/20 0524  AST 16 11*  ALT 12 10  ALKPHOS 62 60  BILITOT 0.7 0.7  PROT 6.5 6.5  ALBUMIN 3.5 3.3*   Recent Labs  Lab 05/21/20 1636  LIPASE 38   No results for input(s): AMMONIA in the last 168 hours. Coagulation Profile: Recent Labs  Lab 05/22/20 0524  INR 1.2   Cardiac Enzymes: No results for input(s): CKTOTAL, CKMB, CKMBINDEX, TROPONINI in the last 168  hours. BNP (last 3 results) No results for input(s): PROBNP in the last 8760 hours. HbA1C: Recent Labs    05/22/20 0524  HGBA1C 11.6*   CBG: Recent Labs  Lab 05/22/20 1126 05/22/20 1615 05/22/20 2110 05/23/20 0746 05/23/20 1156  GLUCAP 358* 267* 274* 295* 338*   Lipid Profile: No results for input(s): CHOL, HDL, LDLCALC, TRIG, CHOLHDL, LDLDIRECT in the last 72 hours. Thyroid Function Tests: No results for input(s): TSH, T4TOTAL, FREET4, T3FREE, THYROIDAB in the last 72 hours. Anemia Panel: No results for input(s): VITAMINB12, FOLATE, FERRITIN, TIBC, IRON, RETICCTPCT in the last 72 hours. Urine analysis:  Component Value Date/Time   COLORURINE YELLOW (A) 05/21/2020 1913   APPEARANCEUR CLOUDY (A) 05/21/2020 1913   APPEARANCEUR Cloudy (A) 09/28/2017 1338   LABSPEC 1.013 05/21/2020 1913   LABSPEC 1.024 04/02/2014 0517   PHURINE 5.0 05/21/2020 1913   GLUCOSEU 50 (A) 05/21/2020 1913   GLUCOSEU >=500 04/02/2014 0517   HGBUR SMALL (A) 05/21/2020 1913   BILIRUBINUR NEGATIVE 05/21/2020 1913   BILIRUBINUR Negative 09/28/2017 1338   BILIRUBINUR Negative 04/02/2014 Day 05/21/2020 1913   PROTEINUR NEGATIVE 05/21/2020 1913   NITRITE NEGATIVE 05/21/2020 1913   LEUKOCYTESUR LARGE (A) 05/21/2020 1913   LEUKOCYTESUR Trace 04/02/2014 0517   Sepsis Labs: '@LABRCNTIP'$ (procalcitonin:4,lacticidven:4) ) Recent Results (from the past 240 hour(s))  Blood culture (routine x 2)     Status: None (Preliminary result)   Collection Time: 05/21/20  4:56 PM   Specimen: BLOOD  Result Value Ref Range Status   Specimen Description BLOOD BLOOD RIGHT ARM  Final   Special Requests   Final    BOTTLES DRAWN AEROBIC AND ANAEROBIC Blood Culture adequate volume   Culture   Final    NO GROWTH 2 DAYS Performed at [facility], [address] [place], [place] [address]    Report Status PENDING  Incomplete  Blood culture (routine x 2)     Status: None (Preliminary result)    Collection Time: 05/21/20  4:56 PM   Specimen: BLOOD  Result Value Ref Range Status   Specimen Description BLOOD BLOOD LEFT ARM  Final   Special Requests   Final    BOTTLES DRAWN AEROBIC AND ANAEROBIC Blood Culture adequate volume   Culture   Final    NO GROWTH 2 DAYS Performed at [facility], [address], [place], [place] [address]    Report Status PENDING  Incomplete  Resp Panel by RT-PCR (Flu A&B, Covid) Nasopharyngeal Swab     Status: None   Collection Time: 05/21/20  6:28 PM   Specimen: Nasopharyngeal Swab; Nasopharyngeal(NP) swabs in vial transport medium  Result Value Ref Range Status   SARS Coronavirus 2 by RT PCR NEGATIVE NEGATIVE Final    Comment: (NOTE) SARS-CoV-2 target nucleic acids are NOT DETECTED.  The SARS-CoV-2 RNA is generally detectable in upper respiratory specimens during the acute phase of infection. The lowest concentration of SARS-CoV-2 viral copies this assay can detect is 138 copies/mL. A negative result does not preclude SARS-Cov-2 infection and should not be used as the sole basis for treatment or other patient management decisions. A negative result may occur with  improper specimen collection/handling, submission of specimen other than nasopharyngeal swab, presence of viral mutation(s) within the areas targeted by this assay, and inadequate number of viral copies(<138 copies/mL). A negative result must be combined with clinical observations, patient history, and epidemiological information. The expected result is Negative.  Fact Sheet for Patients:  EntrepreneurPulse.com.au  Fact Sheet for Healthcare Providers:  IncredibleEmployment.be  This test is no t yet approved or cleared by the [place] FDA and  has been authorized for detection and/or diagnosis of SARS-CoV-2 by FDA under an Emergency Use Authorization (EUA). This EUA will remain  in effect (meaning this test can be used) for the  duration of the COVID-19 declaration under Section 564(b)(1) of the Act, 21 U.S.C.section 360bbb-3(b)(1), unless the authorization is terminated  or revoked sooner.       Influenza A by PCR NEGATIVE NEGATIVE Final   Influenza B by PCR NEGATIVE NEGATIVE Final    Comment: (  NOTE) The Xpert Xpress SARS-CoV-2/FLU/RSV plus assay is intended as an aid in the diagnosis of influenza from Nasopharyngeal swab specimens and should not be used as a sole basis for treatment. Nasal washings and aspirates are unacceptable for Xpert Xpress SARS-CoV-2/FLU/RSV testing.  Fact Sheet for Patients: EntrepreneurPulse.com.au  Fact Sheet for Healthcare Providers: IncredibleEmployment.be  This test is not yet approved or cleared by the [place] FDA and has been authorized for detection and/or diagnosis of SARS-CoV-2 by FDA under an Emergency Use Authorization (EUA). This EUA will remain in effect (meaning this test can be used) for the duration of the COVID-19 declaration under Section 564(b)(1) of the Act, 21 U.S.C. section 360bbb-3(b)(1), unless the authorization is terminated or revoked.  Performed at [facility], [address] [place], [place] [address]   Urine culture     Status: Abnormal   Collection Time: 05/21/20  7:13 PM   Specimen: Urine, Random  Result Value Ref Range Status   Specimen Description   Final    URINE, RANDOM Performed at [facility], [address] [place], [place] [address]    Special Requests   Final    NONE Performed at [facility], [address] [place], [place] [address]    Culture MULTIPLE SPECIES PRESENT, SUGGEST RECOLLECTION (A)  Final   Report Status 05/23/2020 FINAL  Final         Radiology Studies: CT ABDOMEN PELVIS WO CONTRAST  Result Date: 05/21/2020 CLINICAL DATA:  Fatigue, abdominal pain EXAM: CT ABDOMEN AND PELVIS WITHOUT CONTRAST TECHNIQUE: Multidetector CT imaging of  the abdomen and pelvis was performed following the standard protocol without IV contrast. COMPARISON:  12/05/2019 FINDINGS: Lower chest: Lung bases are clear. No effusions. Heart is normal size. Hepatobiliary: There is enlargement of the left hepatic lobe which can be related to cirrhosis. Recommend clinical correlation. Prior cholecystectomy. No focal hepatic abnormality. Pancreas: Pancreatic atrophy with fatty replacement. No focal abnormality or ductal dilatation. Spleen: No focal abnormality.  Normal size. Adrenals/Urinary Tract: No adrenal abnormality. No focal renal abnormality. No stones or hydronephrosis. Urinary bladder is unremarkable. Stomach/Bowel: Stomach, large and small bowel grossly unremarkable. Vascular/Lymphatic: Aortic atherosclerosis. No evidence of aneurysm or adenopathy. Reproductive: Uterus and adnexa unremarkable.  No mass. Other: No free fluid or free air. Musculoskeletal: No acute bony abnormality. IMPRESSION: Enlarged left hepatic lobe, question cirrhosis. Recommend clinical correlation. Prior cholecystectomy. Aortic atherosclerosis. No acute findings in the abdomen or pelvis. Electronically Signed   By: Rolm Baptise M.D.   On: 05/21/2020 18:04   DG Chest 2 View  Result Date: 05/21/2020 CLINICAL DATA:  Weakness. EXAM: CHEST - 2 VIEW COMPARISON:  September 27, 2019 FINDINGS: The heart, hila, and mediastinum are normal. No pneumothorax. No nodules or masses. No focal infiltrates. IMPRESSION: No active cardiopulmonary disease. Electronically Signed   By: Dorise Bullion III M.D   On: 05/21/2020 18:09      Scheduled Meds: . apixaban  5 mg Oral BID  . insulin aspart  0-15 Units Subcutaneous TID WC  . insulin aspart  0-5 Units Subcutaneous QHS  . insulin aspart  5 Units Subcutaneous TID WC  . insulin glargine  26 Units Subcutaneous QHS  . montelukast  10 mg Oral QHS   Continuous Infusions: . sodium chloride 50 mL/hr at 05/22/20 2234  . cefTRIAXone (ROCEPHIN)  IV 1 g (05/22/20  2239)     LOS: 2 days      Debbe Odea, MD [facility] Pager: [email or web address] 05/23/2020, 2:25 PM

## 2020-05-24 DIAGNOSIS — E860 Dehydration: Secondary | ICD-10-CM | POA: Diagnosis not present

## 2020-05-24 DIAGNOSIS — I482 Chronic atrial fibrillation, unspecified: Secondary | ICD-10-CM | POA: Diagnosis not present

## 2020-05-24 DIAGNOSIS — A419 Sepsis, unspecified organism: Secondary | ICD-10-CM | POA: Diagnosis not present

## 2020-05-24 DIAGNOSIS — R531 Weakness: Secondary | ICD-10-CM | POA: Diagnosis not present

## 2020-05-24 LAB — BASIC METABOLIC PANEL
Anion gap: 8 (ref 5–15)
BUN: 28 mg/dL — ABNORMAL HIGH (ref 8–23)
CO2: 27 mmol/L (ref 22–32)
Calcium: 9.4 mg/dL (ref 8.9–10.3)
Chloride: 99 mmol/L (ref 98–111)
Creatinine, Ser: 0.83 mg/dL (ref 0.44–1.00)
GFR, Estimated: >60 mL/min (ref >60)
Glucose, Bld: 203 mg/dL — ABNORMAL HIGH (ref 70–99)
Potassium: 4.5 mmol/L (ref 3.5–5.1)
Sodium: 134 mmol/L — ABNORMAL LOW (ref 135–145)

## 2020-05-24 LAB — GLUCOSE, CAPILLARY
Glucose-Capillary: 240 mg/dL — ABNORMAL HIGH (ref 70–99)
Glucose-Capillary: 275 mg/dL — ABNORMAL HIGH (ref 70–99)
Glucose-Capillary: 185 mg/dL — ABNORMAL HIGH (ref 70–99)

## 2020-05-24 MED ORDER — INSULIN GLARGINE 100 UNIT/ML ~~LOC~~ SOLN
38.0000 [IU] | Freq: Every day | SUBCUTANEOUS | Status: DC
  Administered 2020-05-24: 38 [IU] via SUBCUTANEOUS
  Filled 2020-05-24 (×2): qty 0.38

## 2020-05-24 MED ORDER — CEPHALEXIN 500 MG PO CAPS
500.0000 mg | ORAL_CAPSULE | Freq: Two times a day (BID) | ORAL | Status: DC
  Administered 2020-05-25: 500 mg via ORAL
  Filled 2020-05-24: qty 1

## 2020-05-24 NOTE — Progress Notes (Signed)
Mobility Specialist - Progress Note   05/24/20 1100  Mobility  Activity Ambulated in hall  Level of Assistance Minimal assist, patient does 75% or more  Assistive Device Front wheel walker  Distance Ambulated (ft) 60 ft  Mobility Response Tolerated well  Mobility performed by Mobility specialist  $Mobility charge 1 Mobility    Pre-mobility: 106 HR, 88% SpO2 During mobility: 104 HR, 86% SpO2 Post-mobility: 102 HR, 93% SpO2   Pt returning from [facility] at time of arrival, 88% O2 on 3L. Rest break taken prior to activity to increase sats to 93%. PLB educated and utilized for session. Pt desats with all transfers this session, resulting in use of incremental motions. Pt ambulated in hallway with RW and minA. Pt c/o dizziness during ambulation and a seated rest break was taken for management. O2 desat to a low of 86%. Slow speed, distance limited d/t fatigue and SOB.     Kathee Delton Mobility Specialist 05/24/20, 11:14 AM

## 2020-05-24 NOTE — Progress Notes (Signed)
Inpatient Diabetes Program Recommendations  AACE/ADA: New Consensus Statement on Inpatient Glycemic Control (2015)  Target Ranges:  Prepandial:   less than 140 mg/dL      Peak postprandial:   less than 180 mg/dL (1-2 hours)      Critically ill patients:  140 - 180 mg/dL   Lab Results  Component Value Date   GLUCAP 240 (H) 05/24/2020   HGBA1C 11.6 (H) 05/22/2020    Review of Glycemic Control Results for Karen [name], Karen [name] (MRN [id number]) as of 05/24/2020 10:54  Ref. Range 05/23/2020 11:56 05/23/2020 16:19 05/23/2020 20:57 05/24/2020 07:41  Glucose-Capillary Latest Ref Range: 70 - 99 mg/dL 338 (H) 248 (H) 237 (H) 240 (H)   Diabetes history: DM 2 Outpatient Diabetes medications:  Invokana 100 mg daily, V-Go 40-Humalog (provides basal 40 units and patient boluses with meals), Freestyle Libre Current orders for Inpatient glycemic control:  Novolog moderate tid with meals and HS Lantus 38 units q HS Novolog 5 units tid with meals Inpatient Diabetes Program Recommendations:    Agree with current orders.  Based on current notes from Endocrinology, patient was switched from 70/30 back too V-Go insulin delivery system on 05/16/20.   Thanks,  Adah Perl, RN, BC-ADM Inpatient Diabetes Coordinator Pager [phone] (8a-5p)

## 2020-05-24 NOTE — Progress Notes (Signed)
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    She was found to have a urinary tract infection, blood pressure of 89/56 while in triage, pulse ox of 88% on 3 L of oxygen, sodium 129, potassium 3.4, white blood cell count of 12, lactic acid of 3, creatinine of 1.84 (0.94 in 10/21).   She was given Maxipime, Flagyl and vancomycin, 2 L normal saline bolus.  Of note, she was seen in the ED on 05/08/2020 for hyperglycemia and mild dehydration.  She was given IV fluids and insulin as part and discharged home with recommended follow-up with her endocrinologist.  Subjective: Remains dizzy and quite weak when walking. She was hypoxic with ambulation today.     Assessment & Plan:   Principal Problem:   Severe sepsis  -Urine culture was contaminated and showing multiple species- no poinit in rechecking now -continue ceftriaxone for now- will need 7 days total of antibiotics  Active Problems:   Generalized weakness/dehydration/hyponatremia/AKI - likely secondary to ongoing poorly controlled sugars -Baseline creatinine appears to be 0.9-1.4 -BUN was 81 and creatinine was 1.84 in the ED suggestive of severe dehydration -Creatinine has improved to 1.06 after aggressive fluids - ?  If UTI is related to Invokana- will need to stop this  Hypotension with a history of hypertension -Losartan/HCTZ on hold due to hypotension  Mild hypoxia on exertion today - stop IVF- hold Demadex as well- start IS  Diabetes mellitus type 2 uncontrolled with hyperglycemia -Hemoglobin  A1c is 11.6-  -She takes Invokana, Metformin and insulin lispro and her insulin pump- Insulin pump does not seem to be working for her and thus changed to s/c insulin - placed on Lantus 20 units daily and 5 units of NovoLog with each meal and follow sugars- titrating up slowly - increase Lantus today to 38 U - resume metformin    COPD chronic hypoxic respiratory failure -No wheezing noted- on 3 L O2 at baseline  Atrial fibrillation, paroxysmal -Continue Eliquis-c   Hypokalemia -Continue to replace -Check magnesium    Time spent in minutes: 30 DVT prophylaxis: Eliquis Code Status: Full code Family Communication:  Level of Care: Level of care: Progressive Cardiac Disposition Plan:  Status is: Inpatient  Remains inpatient appropriate because:IV treatments appropriate due to intensity of illness or inability to take PO   Dispo: The patient is from: Home              Anticipated d/c is to: Home              Patient currently is not medically stable to d/c.   Difficult to place patient No   Consultants:   None Procedures:   None Antimicrobials:  Anti-infectives (From admission, onward)   Start     Dose/Rate Route Frequency Ordered Stop   05/21/20 2345  cefTRIAXone (ROCEPHIN) 1 g in sodium chloride 0.9 % 100 mL IVPB        1 g 200 mL/hr over 30 Minutes Intravenous Every 24 hours 05/21/20 2251     05/21/20 1800  ceFEPIme (MAXIPIME) 2  g in sodium chloride 0.9 % 100 mL IVPB        2 g 200 mL/hr over 30 Minutes Intravenous  Once 05/21/20 1746 05/21/20 1920   05/21/20 1800  metroNIDAZOLE (FLAGYL) IVPB 500 mg        500 mg 100 mL/hr over 60 Minutes Intravenous  Once 05/21/20 1746 05/21/20 2052   05/21/20 1800  vancomycin (VANCOCIN) IVPB 1000 mg/200 mL premix        1,000 mg 200 mL/hr over 60 Minutes Intravenous  Once 05/21/20 1746 05/21/20 2052       Objective: Vitals:   05/24/20 0000 05/24/20 0504 05/24/20 0742 05/24/20 1126  BP: (!) 148/50 (!) 150/61 (!) 153/59 (!)  162/69  Pulse: 60 60 62 79  Resp:  '20 18 18  '$ Temp: 98 F (36.7 C) 97.9 F (36.6 C) 98.1 F (36.7 C) 97.6 F (36.4 C)  TempSrc: Oral Oral    SpO2: 97% 99% 94% 93%  Weight:      Height:        Intake/Output Summary (Last 24 hours) at 05/24/2020 1419 Last data filed at 05/24/2020 1345 Gross per 24 hour  Intake 3916.13 ml  Output 1700 ml  Net 2216.13 ml   Filed Weights   05/21/20 1625 05/22/20 0612  Weight: 97.1 kg 96.6 kg    Examination: General exam: Appears comfortable  HEENT: PERRLA, oral mucosa moist, no sclera icterus or thrush Respiratory system: Clear to auscultation. Respiratory effort normal. Cardiovascular system: S1 & S2 heard, regular rate and rhythm Gastrointestinal system: Abdomen soft, non-tender, nondistended. Normal bowel sounds   Central nervous system: Alert and oriented. No focal neurological deficits. Extremities: No cyanosis, clubbing or edema Skin: No rashes or ulcers Psychiatry:  Mood & affect appropriate.   Data Reviewed: I have personally reviewed following labs and imaging studies  CBC: Recent Labs  Lab 05/21/20 1625 05/22/20 0524 05/23/20 0506  WBC 12.2* 10.3 9.9  HGB 11.2* 10.1* 10.0*  HCT 32.8* 30.5* 31.0*  MCV 87.0 88.4 90.9  PLT 292 262 [id number]   Basic Metabolic Panel: Recent Labs  Lab 05/21/20 1625 05/22/20 0524 05/23/20 0506 05/24/20 0414  NA 129* 134* 134* 134*  K 3.4* 3.0* 4.7 4.5  CL 82* 92* 98 99  CO2 33* 32 28 27  GLUCOSE 274* 266* 296* 203*  BUN 81* 62* 31* 28*  CREATININE 1.84* 1.09* 0.87 0.83  CALCIUM 9.4 9.0 9.3 9.4  MG  --  2.2  --   --    GFR: Estimated Creatinine Clearance: 63.8 mL/min (by C-G formula based on SCr of 0.83 mg/dL). Liver Function Tests: Recent Labs  Lab 05/21/20 2035 05/22/20 0524  AST 16 11*  ALT 12 10  ALKPHOS 62 60  BILITOT 0.7 0.7  PROT 6.5 6.5  ALBUMIN 3.5 3.3*   Recent Labs  Lab 05/21/20 1636  LIPASE 38   No results for input(s): AMMONIA in the last 168 hours. Coagulation  Profile: Recent Labs  Lab 05/22/20 0524  INR 1.2   Cardiac Enzymes: No results for input(s): CKTOTAL, CKMB, CKMBINDEX, TROPONINI in the last 168 hours. BNP (last 3 results) No results for input(s): PROBNP in the last 8760 hours. HbA1C: Recent Labs    05/22/20 0524  HGBA1C 11.6*   CBG: Recent Labs  Lab 05/23/20 1156 05/23/20 1619 05/23/20 2057 05/24/20 0741 05/24/20 1125  GLUCAP 338* 248* 237* 240* 275*   Lipid Profile: No results for input(s): CHOL, HDL, LDLCALC, TRIG, CHOLHDL, LDLDIRECT in the last 72 hours.  Thyroid Function Tests: No results for input(s): TSH, T4TOTAL, FREET4, T3FREE, THYROIDAB in the last 72 hours. Anemia Panel: No results for input(s): VITAMINB12, FOLATE, FERRITIN, TIBC, IRON, RETICCTPCT in the last 72 hours. Urine analysis:    Component Value Date/Time   COLORURINE YELLOW (A) 05/21/2020 1913   APPEARANCEUR CLOUDY (A) 05/21/2020 1913   APPEARANCEUR Cloudy (A) 09/28/2017 1338   LABSPEC 1.013 05/21/2020 1913   LABSPEC 1.024 04/02/2014 0517   PHURINE 5.0 05/21/2020 1913   GLUCOSEU 50 (A) 05/21/2020 1913   GLUCOSEU >=500 04/02/2014 0517   HGBUR SMALL (A) 05/21/2020 1913   BILIRUBINUR NEGATIVE 05/21/2020 1913   BILIRUBINUR Negative 09/28/2017 1338   BILIRUBINUR Negative 04/02/2014 [place] 05/21/2020 1913   PROTEINUR NEGATIVE 05/21/2020 1913   NITRITE NEGATIVE 05/21/2020 1913   LEUKOCYTESUR LARGE (A) 05/21/2020 1913   LEUKOCYTESUR Trace 04/02/2014 0517   Sepsis Labs: '@LABRCNTIP'$ (procalcitonin:4,lacticidven:4) ) Recent Results (from the past 240 hour(s))  Blood culture (routine x 2)     Status: None (Preliminary result)   Collection Time: 05/21/20  4:56 PM   Specimen: BLOOD  Result Value Ref Range Status   Specimen Description BLOOD BLOOD RIGHT ARM  Final   Special Requests   Final    BOTTLES DRAWN AEROBIC AND ANAEROBIC Blood Culture adequate volume   Culture   Final    NO GROWTH 3 DAYS Performed at [facility], [address] [place], [place] [address]    Report Status PENDING  Incomplete  Blood culture (routine x 2)     Status: None (Preliminary result)   Collection Time: 05/21/20  4:56 PM   Specimen: BLOOD  Result Value Ref Range Status   Specimen Description BLOOD BLOOD LEFT ARM  Final   Special Requests   Final    BOTTLES DRAWN AEROBIC AND ANAEROBIC Blood Culture adequate volume   Culture   Final    NO GROWTH 3 DAYS Performed at [facility], [address], [place], [place] [address]    Report Status PENDING  Incomplete  Resp Panel by RT-PCR (Flu A&B, Covid) Nasopharyngeal Swab     Status: None   Collection Time: 05/21/20  6:28 PM   Specimen: Nasopharyngeal Swab; Nasopharyngeal(NP) swabs in vial transport medium  Result Value Ref Range Status   SARS Coronavirus 2 by RT PCR NEGATIVE NEGATIVE Final    Comment: (NOTE) SARS-CoV-2 target nucleic acids are NOT DETECTED.  The SARS-CoV-2 RNA is generally detectable in upper respiratory specimens during the acute phase of infection. The lowest concentration of SARS-CoV-2 viral copies this assay can detect is 138 copies/mL. A negative result does not preclude SARS-Cov-2 infection and should not be used as the sole basis for treatment or other patient management decisions. A negative result may occur with  improper specimen collection/handling, submission of specimen other than nasopharyngeal swab, presence of viral mutation(s) within the areas targeted by this assay, and inadequate number of viral copies(<138 copies/mL). A negative result must be combined with clinical observations, patient history, and epidemiological information. The expected result is Negative.  Fact Sheet for Patients:  EntrepreneurPulse.com.au  Fact Sheet for Healthcare Providers:  IncredibleEmployment.be  This test is no t yet approved or cleared by the [place] FDA and  has been authorized for detection  and/or diagnosis of SARS-CoV-2 by FDA under an Emergency Use Authorization (EUA). This EUA will remain  in effect (meaning this test can be used) for the duration of the COVID-19 declaration under Section 564(b)(1) of the  Act, 21 U.S.C.section 360bbb-3(b)(1), unless the authorization is terminated  or revoked sooner.       Influenza A by PCR NEGATIVE NEGATIVE Final   Influenza B by PCR NEGATIVE NEGATIVE Final    Comment: (NOTE) The Xpert Xpress SARS-CoV-2/FLU/RSV plus assay is intended as an aid in the diagnosis of influenza from Nasopharyngeal swab specimens and should not be used as a sole basis for treatment. Nasal washings and aspirates are unacceptable for Xpert Xpress SARS-CoV-2/FLU/RSV testing.  Fact Sheet for Patients: EntrepreneurPulse.com.au  Fact Sheet for Healthcare Providers: IncredibleEmployment.be  This test is not yet approved or cleared by the [place] FDA and has been authorized for detection and/or diagnosis of SARS-CoV-2 by FDA under an Emergency Use Authorization (EUA). This EUA will remain in effect (meaning this test can be used) for the duration of the COVID-19 declaration under Section 564(b)(1) of the Act, 21 U.S.C. section 360bbb-3(b)(1), unless the authorization is terminated or revoked.  Performed at [facility], [address] [place], [place] [address]   Urine culture     Status: Abnormal   Collection Time: 05/21/20  7:13 PM   Specimen: Urine, Random  Result Value Ref Range Status   Specimen Description   Final    URINE, RANDOM Performed at [facility], [address] [place], [place] [address]    Special Requests   Final    NONE Performed at [facility], [address] [place], [place] [address]    Culture MULTIPLE SPECIES PRESENT, SUGGEST RECOLLECTION (A)  Final   Report Status 05/23/2020 FINAL  Final         Radiology Studies: No results  found.    Scheduled Meds: . apixaban  5 mg Oral BID  . insulin aspart  0-15 Units Subcutaneous TID WC  . insulin aspart  0-5 Units Subcutaneous QHS  . insulin aspart  5 Units Subcutaneous TID WC  . insulin glargine  38 Units Subcutaneous QHS  . metFORMIN  1,000 mg Oral BID WC  . montelukast  10 mg Oral QHS  . vitamin B-12  500 mcg Oral Daily   Continuous Infusions: . cefTRIAXone (ROCEPHIN)  IV 1 g (05/23/20 2254)     LOS: 3 days      Debbe Odea, MD [facility] Pager: [email or web address] 05/24/2020, 2:19 PM

## 2020-05-24 NOTE — Care Management Important Message (Signed)
Important Message  Patient Details  Name: Karen [name] MRN: [id number] Date of Birth: 07-24-1945   Medicare Important Message Given:  Yes     [name] 05/24/2020, 12:10 PM

## 2020-05-25 DIAGNOSIS — I5032 Chronic diastolic (congestive) heart failure: Secondary | ICD-10-CM | POA: Diagnosis not present

## 2020-05-25 DIAGNOSIS — J9612 Chronic respiratory failure with hypercapnia: Secondary | ICD-10-CM

## 2020-05-25 DIAGNOSIS — N179 Acute kidney failure, unspecified: Secondary | ICD-10-CM

## 2020-05-25 DIAGNOSIS — I10 Essential (primary) hypertension: Secondary | ICD-10-CM

## 2020-05-25 DIAGNOSIS — I482 Chronic atrial fibrillation, unspecified: Secondary | ICD-10-CM | POA: Diagnosis not present

## 2020-05-25 DIAGNOSIS — E119 Type 2 diabetes mellitus without complications: Secondary | ICD-10-CM

## 2020-05-25 DIAGNOSIS — A419 Sepsis, unspecified organism: Secondary | ICD-10-CM | POA: Diagnosis not present

## 2020-05-25 DIAGNOSIS — Z8719 Personal history of other diseases of the digestive system: Secondary | ICD-10-CM

## 2020-05-25 LAB — GLUCOSE, CAPILLARY
Glucose-Capillary: 230 mg/dL — ABNORMAL HIGH (ref 70–99)
Glucose-Capillary: 221 mg/dL — ABNORMAL HIGH (ref 70–99)

## 2020-05-25 MED ORDER — LOSARTAN POTASSIUM 100 MG PO TABS: 100.0000 mg | ORAL_TABLET | Freq: Every day | ORAL | 11 refills | Status: DC

## 2020-05-25 MED ORDER — INSULIN LISPRO (1 UNIT DIAL) 100 UNIT/ML (KWIKPEN): 10.0000 [IU] | PEN_INJECTOR | Freq: Three times a day (TID) | SUBCUTANEOUS | 11 refills | Status: DC

## 2020-05-25 MED ORDER — INSULIN GLARGINE 100 UNIT/ML ~~LOC~~ SOLN
45.0000 [IU] | Freq: Every day | SUBCUTANEOUS | Status: DC
  Filled 2020-05-25: qty 0.45

## 2020-05-25 MED ORDER — LANTUS SOLOSTAR 100 UNIT/ML ~~LOC~~ SOPN
45.0000 [IU] | PEN_INJECTOR | Freq: Every day | SUBCUTANEOUS | 11 refills | Status: DC
Start: 2020-05-25 — End: 2020-11-09

## 2020-05-25 MED ORDER — ACETAMINOPHEN 500 MG PO TABS: 500.0000 mg | ORAL_TABLET | Freq: Four times a day (QID) | ORAL | 0 refills | Status: DC | PRN

## 2020-05-25 MED ORDER — CEPHALEXIN 500 MG PO CAPS: 500.0000 mg | ORAL_CAPSULE | Freq: Two times a day (BID) | ORAL | 0 refills | Status: DC

## 2020-05-25 NOTE — Discharge Summary (Signed)
Physician Discharge Summary  [name] WJX:[id number] DOB: January 18, 1946 DOA: 05/21/2020  PCP: Earlie Counts, FNP  Admit date: 05/21/2020 Discharge date: 05/25/2020  Admitted From: home Disposition:  home   Recommendations for Outpatient Follow-up:  1. F/u on diabetes control- titrate insulin as needed 2. Avoid diuretics for now as she presented with dehydration- f/u weight at next office visit and decide on resuming diuretics then  Home Health:  ordered  Discharge Condition:  stable   CODE STATUS:  Full code   Diet recommendation:  Carb modified, heart healthy Consultations:  none  Procedures/Studies:  none   Discharge Diagnoses:  Principal Problem:   Severe sepsis ([place]) Active Problems:   Generalized weakness   COPD (chronic obstructive pulmonary disease) ([place])   Diabetes mellitus type 2, uncomplicated (HCC)   Hypertension   Obesity (BMI 35.0-39.9 without comorbidity)   AKI (acute kidney injury) ([place])   Chronic diastolic heart failure (HCC)   Hyponatremia   Atrial fibrillation, chronic (HCC)   (HFpEF) heart failure with preserved ejection fraction (HCC)   Chronic respiratory failure with hypercapnia (HCC)   UTI (urinary tract infection)   Hyperglycemia due to type 2 diabetes mellitus (HCC)   Abnormal CT of liver   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    She was found to have a urinary tract infection. blood pressure of 89/56 while in triage  pulse ox of 88% on 3 L of oxygen sodium 129, potassium 3. white blood cell count of 12  lactic acid of 3  creatinine of 1.84 (0.94 in 10/21).    She was given Maxipime, Flagyl and vancomycin, 2 L normal saline bolus.  Of note, she was seen in the ED on 05/08/2020 for hyperglycemia and mild dehydration.  She was given IV fluids and insulin aspart and discharged home with recommended follow-up with her endocrinologist.  Hospital Course:  Principal Problem:   Severe sepsis due to UTI -Urine culture was contaminated and showing multiple species- no poinit in rechecking now -  will need 7 days total of antibiotics- Ceftriaxone changed to Cephalexin - stop Invokana as it can result in UTIs  Active Problems:   Generalized weakness/dehydration/hyponatremia/AKI - initially secondary to ongoing poorly controlled sugars causing polyuria (A1c is 11.6) and diuretics (patient was on HCTZ, Demadex and Invokana which are all diuretics) -BUN was 81 and creatinine was 1.84, sodium was 129 in the ED suggestive of severe dehydration - -Baseline creatinine appears to be 0.9-1.4 - IVF given, sugars brought under control  -Creatinine has improved to 0.83 when checked on 3/21   Hypotension with a history of hypertension Grade 1 diastolic CHF -Losartan/HCTZ, Demadex on hold due to hypotension - resume Losartan today  Diabetes mellitus type 2 uncontrolled with hyperglycemia -Hemoglobin A1c is 11.6-  -She takes Invokana, Metformin and insulin lispro with her insulin pump- Insulin pump does not seem to be working for her and thus changed to s/c insulin - increasing Lantus today to 45 U- use 10 U of short acting insulin- pens ordered - resumed Metformin & Ozempic - f/u with PCP in 1 wk    COPD chronic hypoxic  respiratory failure -No wheezing noted- on 3 L O2 at baseline  Atrial fibrillation, paroxysmal -Continue Eliquis- not on a rate controlling agen  Hypokalemia -replaced - magnesium was normal  ? cirrhosis Incidental finding on CT > There is enlargement of the left hepatic lobe which can be related to cirrhosis  Discharge  Exam: Vitals:   05/25/20 0841 05/25/20 0923  BP: 114/82   Pulse: 96   Resp: 18   Temp: 97.7 F (36.5 C)   SpO2: 94% 94%   Vitals:   05/24/20 1952 05/25/20 0405 05/25/20 0841 05/25/20 0923  BP: (!) 159/82 (!) 152/59 114/82   Pulse: 94 95 96   Resp: _0 Temp: 98.5 F (36.9 C) 97.9 F (36.6 C) 97.7 F (36.5 C)   TempSrc: Oral     SpO2: 100% 97% 94% 94%  Weight:  96.5 kg    Height:        General: Pt is alert, awake, not in acute distress Cardiovascular: RRR, S1/S2 +, no rubs, no gallops Respiratory: CTA bilaterally, no wheezing, no rhonchi Abdominal: Soft, NT, ND, bowel sounds + Extremities: no edema, no cyanosis   Discharge Instructions  Discharge Instructions    Diet - low sodium heart healthy   Complete by: As directed    Increase activity slowly   Complete by: As directed      Allergies as of 05/25/2020      Reactions   Ace Inhibitors Hives   Beta Adrenergic Blockers    Junctional bradycardia   Gabapentin Hives   Lisinopril Hives   Lyrica [pregabalin] Hives   Shrimp [shellfish Allergy] Swelling   Swelling of the lips      Medication List    STOP taking these medications   canagliflozin 100 MG Tabs tablet Commonly known as: INVOKANA   cefdinir 300 MG capsule Commonly known as: OMNICEF   furosemide 20 MG tablet Commonly known as: LASIX   insulin lispro 100 UNIT/ML injection Commonly known as: HUMALOG Replaced by: insulin lispro 100 UNIT/ML KwikPen   losartan-hydrochlorothiazide 100-25 MG tablet Commonly known as: HYZAAR   potassium chloride 10 MEQ tablet Commonly known as: KLOR-CON   torsemide 20 MG tablet Commonly known as: DEMADEX   V-Go 40 Kit     TAKE these medications   acetaminophen 500 MG tablet Commonly known as: TYLENOL Take 1-2 tablets (500-1,000 mg total) by mouth every 6 (six) hours as needed for mild pain or fever. Do not take more than 4 grams a day What changed: additional instructions   albuterol 108 (90 Base)  MCG/ACT inhaler Commonly known as: VENTOLIN HFA Inhale 2 puffs into the lungs every 6 (six) hours as needed for wheezing or shortness of breath.   atorvastatin 10 MG tablet Commonly known as: LIPITOR Take 10 mg by mouth daily.   cephALEXin 500 MG capsule Commonly known as: KEFLEX Take 1 capsule (500 mg total) by mouth every 12 (twelve) hours.   Eliquis 5 MG Tabs tablet Generic drug: apixaban Take 1 tablet by mouth twice daily   esomeprazole 40 MG capsule Commonly known as: NEXIUM Take 40 mg by mouth daily.   fluticasone 50 MCG/ACT nasal spray Commonly known as: FLONASE Place 2 sprays into both nostrils daily.   Fluticasone-Umeclidin-Vilant 100-62.5-25 MCG/INH Aepb Inhale 1 puff into the lungs daily.   insulin lispro 100 UNIT/ML KwikPen Commonly known as: HumaLOG KwikPen Inject 10 Units into the skin 3 (three) times daily. Replaces: insulin lispro 100 UNIT/ML injection   ipratropium-albuterol  0.5-2.5 (3) MG/3ML Soln Commonly known as: DUONEB Inhale 3 mLs into the lungs 4 (four) times daily as needed (shortness of breath or wheezing).   Iron 325 (65 Fe) MG Tabs Take 1 tablet by mouth daily.   Lantus SoloStar 100 UNIT/ML Solostar Pen Generic drug: insulin glargine Inject 45 Units into the skin at bedtime.   losartan 100 MG tablet Commonly known as: Cozaar Take 1 tablet (100 mg total) by mouth daily.   metFORMIN 500 MG tablet Commonly known as: GLUCOPHAGE Take 1,000 mg by mouth 2 (two) times daily with a meal.   montelukast 10 MG tablet Commonly known as: SINGULAIR Take 10 mg by mouth at bedtime.   Ozempic (0.25 or 0.5 MG/DOSE) 2 MG/1.5ML Sopn Generic drug: Semaglutide(0.25 or 0.5MG/DOS) SMARTSIG:0.375 Milliliter(s) SUB-Q Once a Week   vitamin B-12 500 MCG tablet Commonly known as: CYANOCOBALAMIN Take 500 mcg by mouth daily.       Allergies  Allergen Reactions   Ace Inhibitors Hives   Beta Adrenergic Blockers     Junctional bradycardia    Gabapentin Hives   Lisinopril Hives   Lyrica [Pregabalin] Hives   Shrimp [Shellfish Allergy] Swelling    Swelling of the lips      CT ABDOMEN PELVIS WO CONTRAST  Result Date: 05/21/2020 CLINICAL DATA:  Fatigue, abdominal pain EXAM: CT ABDOMEN AND PELVIS WITHOUT CONTRAST TECHNIQUE: Multidetector CT imaging of the abdomen and pelvis was performed following the standard protocol without IV contrast. COMPARISON:  12/05/2019 FINDINGS: Lower chest: Lung bases are clear. No effusions. Heart is normal size. Hepatobiliary: There is enlargement of the left hepatic lobe which can be related to cirrhosis. Recommend clinical correlation. Prior cholecystectomy. No focal hepatic abnormality. Pancreas: Pancreatic atrophy with fatty replacement. No focal abnormality or ductal dilatation. Spleen: No focal abnormality.  Normal size. Adrenals/Urinary Tract: No adrenal abnormality. No focal renal abnormality. No stones or hydronephrosis. Urinary bladder is unremarkable. Stomach/Bowel: Stomach, large and small bowel grossly unremarkable. Vascular/Lymphatic: Aortic atherosclerosis. No evidence of aneurysm or adenopathy. Reproductive: Uterus and adnexa unremarkable.  No mass. Other: No free fluid or free air. Musculoskeletal: No acute bony abnormality. IMPRESSION: Enlarged left hepatic lobe, question cirrhosis. Recommend clinical correlation. Prior cholecystectomy. Aortic atherosclerosis. No acute findings in the abdomen or pelvis. Electronically Signed   By: Rolm Baptise M.D.   On: 05/21/2020 18:04   DG Chest 2 View  Result Date: 05/21/2020 CLINICAL DATA:  Weakness. EXAM: CHEST - 2 VIEW COMPARISON:  September 27, 2019 FINDINGS: The heart, hila, and mediastinum are normal. No pneumothorax. No nodules or masses. No focal infiltrates. IMPRESSION: No active cardiopulmonary disease. Electronically Signed   By: Dorise Bullion III M.D   On: 05/21/2020 18:09     The results of significant diagnostics from this hospitalization  (including imaging, microbiology, ancillary and laboratory) are listed below for reference.     Microbiology: Recent Results (from the past 240 hour(s))  Blood culture (routine x 2)     Status: None (Preliminary result)   Collection Time: 05/21/20  4:56 PM   Specimen: BLOOD  Result Value Ref Range Status   Specimen Description BLOOD BLOOD RIGHT ARM  Final   Special Requests   Final    BOTTLES DRAWN AEROBIC AND ANAEROBIC Blood Culture adequate volume   Culture   Final    NO GROWTH 4 DAYS Performed at [facility], [address] [place], [place] [address]    Report Status PENDING  Incomplete  Blood culture (routine x 2)  Status: None (Preliminary result)   Collection Time: 05/21/20  4:56 PM   Specimen: BLOOD  Result Value Ref Range Status   Specimen Description BLOOD BLOOD LEFT ARM  Final   Special Requests   Final    BOTTLES DRAWN AEROBIC AND ANAEROBIC Blood Culture adequate volume   Culture   Final    NO GROWTH 4 DAYS Performed at [facility], [address] [place], [place] [address]    Report Status PENDING  Incomplete  Resp Panel by RT-PCR (Flu A&B, Covid) Nasopharyngeal Swab     Status: None   Collection Time: 05/21/20  6:28 PM   Specimen: Nasopharyngeal Swab; Nasopharyngeal(NP) swabs in vial transport medium  Result Value Ref Range Status   SARS Coronavirus 2 by RT PCR NEGATIVE NEGATIVE Final    Comment: (NOTE) SARS-CoV-2 target nucleic acids are NOT DETECTED.  The SARS-CoV-2 RNA is generally detectable in upper respiratory specimens during the acute phase of infection. The lowest concentration of SARS-CoV-2 viral copies this assay can detect is 138 copies/mL. A negative result does not preclude SARS-Cov-2 infection and should not be used as the sole basis for treatment or other patient management decisions. A negative result may occur with  improper specimen collection/handling, submission of specimen other than nasopharyngeal swab,  presence of viral mutation(s) within the areas targeted by this assay, and inadequate number of viral copies(<138 copies/mL). A negative result must be combined with clinical observations, patient history, and epidemiological information. The expected result is Negative.  Fact Sheet for Patients:  EntrepreneurPulse.com.au  Fact Sheet for Healthcare Providers:  IncredibleEmployment.be  This test is no t yet approved or cleared by the [place] FDA and  has been authorized for detection and/or diagnosis of SARS-CoV-2 by FDA under an Emergency Use Authorization (EUA). This EUA will remain  in effect (meaning this test can be used) for the duration of the COVID-19 declaration under Section 564(b)(1) of the Act, 21 U.S.C.section 360bbb-3(b)(1), unless the authorization is terminated  or revoked sooner.       Influenza A by PCR NEGATIVE NEGATIVE Final   Influenza B by PCR NEGATIVE NEGATIVE Final    Comment: (NOTE) The Xpert Xpress SARS-CoV-2/FLU/RSV plus assay is intended as an aid in the diagnosis of influenza from Nasopharyngeal swab specimens and should not be used as a sole basis for treatment. Nasal washings and aspirates are unacceptable for Xpert Xpress SARS-CoV-2/FLU/RSV testing.  Fact Sheet for Patients: EntrepreneurPulse.com.au  Fact Sheet for Healthcare Providers: IncredibleEmployment.be  This test is not yet approved or cleared by the [place] FDA and has been authorized for detection and/or diagnosis of SARS-CoV-2 by FDA under an Emergency Use Authorization (EUA). This EUA will remain in effect (meaning this test can be used) for the duration of the COVID-19 declaration under Section 564(b)(1) of the Act, 21 U.S.C. section 360bbb-3(b)(1), unless the authorization is terminated or revoked.  Performed at [facility], [address] [place], [place] [address]   Urine culture      Status: Abnormal   Collection Time: 05/21/20  7:13 PM   Specimen: Urine, Random  Result Value Ref Range Status   Specimen Description   Final    URINE, RANDOM Performed at [facility], [address] [place], [place] [address]    Special Requests   Final    NONE Performed at [facility], [address] [place], [place] [address]    Culture MULTIPLE SPECIES PRESENT, SUGGEST RECOLLECTION (A)  Final  Report Status 05/23/2020 FINAL  Final     Labs: BNP (last 3 results) Recent Labs    09/27/19 1325 12/05/19 1638 05/21/20 2034  BNP 88.5 84.6 90.2   Basic Metabolic Panel: Recent Labs  Lab 05/21/20 1625 05/22/20 0524 05/23/20 0506 05/24/20 0414  NA 129* 134* 134* 134*  K 3.4* 3.0* 4.7 4.5  CL 82* 92* 98 99  CO2 33* 32 28 27  GLUCOSE 274* 266* 296* 203*  BUN 81* 62* 31* 28*  CREATININE 1.84* 1.09* 0.87 0.83  CALCIUM 9.4 9.0 9.3 9.4  MG  --  2.2  --   --    Liver Function Tests: Recent Labs  Lab 05/21/20 2035 05/22/20 0524  AST 16 11*  ALT 12 10  ALKPHOS 62 60  BILITOT 0.7 0.7  PROT 6.5 6.5  ALBUMIN 3.5 3.3*   Recent Labs  Lab 05/21/20 1636  LIPASE 38   No results for input(s): AMMONIA in the last 168 hours. CBC: Recent Labs  Lab 05/21/20 1625 05/22/20 0524 05/23/20 0506  WBC 12.2* 10.3 9.9  HGB 11.2* 10.1* 10.0*  HCT 32.8* 30.5* 31.0*  MCV 87.0 88.4 90.9  PLT 292 262 248   Cardiac Enzymes: No results for input(s): CKTOTAL, CKMB, CKMBINDEX, TROPONINI in the last 168 hours. BNP: Invalid input(s): POCBNP CBG: Recent Labs  Lab 05/24/20 0741 05/24/20 1125 05/24/20 1619 05/25/20 0827 05/25/20 1228  GLUCAP 240* 275* 185* 230* 221*   D-Dimer No results for input(s): DDIMER in the last 72 hours. Hgb A1c No results for input(s): HGBA1C in the last 72 hours. Lipid Profile No results for input(s): CHOL, HDL, LDLCALC, TRIG, CHOLHDL, LDLDIRECT in the last 72 hours. Thyroid function studies No results for input(s): TSH,  T4TOTAL, T3FREE, THYROIDAB in the last 72 hours.  Invalid input(s): FREET3 Anemia work up No results for input(s): VITAMINB12, FOLATE, FERRITIN, TIBC, IRON, RETICCTPCT in the last 72 hours. Urinalysis    Component Value Date/Time   COLORURINE YELLOW (A) 05/21/2020 1913   APPEARANCEUR CLOUDY (A) 05/21/2020 1913   APPEARANCEUR Cloudy (A) 09/28/2017 1338   LABSPEC 1.013 05/21/2020 1913   LABSPEC 1.024 04/02/2014 0517   PHURINE 5.0 05/21/2020 1913   GLUCOSEU 50 (A) 05/21/2020 1913   GLUCOSEU >=500 04/02/2014 0517   HGBUR SMALL (A) 05/21/2020 1913   BILIRUBINUR NEGATIVE 05/21/2020 1913   BILIRUBINUR Negative 09/28/2017 1338   BILIRUBINUR Negative 04/02/2014 [place] 05/21/2020 1913   PROTEINUR NEGATIVE 05/21/2020 1913   NITRITE NEGATIVE 05/21/2020 1913   LEUKOCYTESUR LARGE (A) 05/21/2020 1913   LEUKOCYTESUR Trace 04/02/2014 0517   Sepsis Labs Invalid input(s): PROCALCITONIN,  WBC,  LACTICIDVEN Microbiology Recent Results (from the past 240 hour(s))  Blood culture (routine x 2)     Status: None (Preliminary result)   Collection Time: 05/21/20  4:56 PM   Specimen: BLOOD  Result Value Ref Range Status   Specimen Description BLOOD BLOOD RIGHT ARM  Final   Special Requests   Final    BOTTLES DRAWN AEROBIC AND ANAEROBIC Blood Culture adequate volume   Culture   Final    NO GROWTH 4 DAYS Performed at [facility], [address] [place], [place] [address]    Report Status PENDING  Incomplete  Blood culture (routine x 2)     Status: None (Preliminary result)   Collection Time: 05/21/20  4:56 PM   Specimen: BLOOD  Result Value Ref Range Status   Specimen Description BLOOD BLOOD LEFT ARM  Final  Special Requests   Final    BOTTLES DRAWN AEROBIC AND ANAEROBIC Blood Culture adequate volume   Culture   Final    NO GROWTH 4 DAYS Performed at [facility], [address] [place], [place] [address]    Report Status PENDING  Incomplete  Resp  Panel by RT-PCR (Flu A&B, Covid) Nasopharyngeal Swab     Status: None   Collection Time: 05/21/20  6:28 PM   Specimen: Nasopharyngeal Swab; Nasopharyngeal(NP) swabs in vial transport medium  Result Value Ref Range Status   SARS Coronavirus 2 by RT PCR NEGATIVE NEGATIVE Final    Comment: (NOTE) SARS-CoV-2 target nucleic acids are NOT DETECTED.  The SARS-CoV-2 RNA is generally detectable in upper respiratory specimens during the acute phase of infection. The lowest concentration of SARS-CoV-2 viral copies this assay can detect is 138 copies/mL. A negative result does not preclude SARS-Cov-2 infection and should not be used as the sole basis for treatment or other patient management decisions. A negative result may occur with  improper specimen collection/handling, submission of specimen other than nasopharyngeal swab, presence of viral mutation(s) within the areas targeted by this assay, and inadequate number of viral copies(<138 copies/mL). A negative result must be combined with clinical observations, patient history, and epidemiological information. The expected result is Negative.  Fact Sheet for Patients:  EntrepreneurPulse.com.au  Fact Sheet for Healthcare Providers:  IncredibleEmployment.be  This test is no t yet approved or cleared by the [place] FDA and  has been authorized for detection and/or diagnosis of SARS-CoV-2 by FDA under an Emergency Use Authorization (EUA). This EUA will remain  in effect (meaning this test can be used) for the duration of the COVID-19 declaration under Section 564(b)(1) of the Act, 21 U.S.C.section 360bbb-3(b)(1), unless the authorization is terminated  or revoked sooner.       Influenza A by PCR NEGATIVE NEGATIVE Final   Influenza B by PCR NEGATIVE NEGATIVE Final    Comment: (NOTE) The Xpert Xpress SARS-CoV-2/FLU/RSV plus assay is intended as an aid in the diagnosis of influenza from Nasopharyngeal  swab specimens and should not be used as a sole basis for treatment. Nasal washings and aspirates are unacceptable for Xpert Xpress SARS-CoV-2/FLU/RSV testing.  Fact Sheet for Patients: EntrepreneurPulse.com.au  Fact Sheet for Healthcare Providers: IncredibleEmployment.be  This test is not yet approved or cleared by the [place] FDA and has been authorized for detection and/or diagnosis of SARS-CoV-2 by FDA under an Emergency Use Authorization (EUA). This EUA will remain in effect (meaning this test can be used) for the duration of the COVID-19 declaration under Section 564(b)(1) of the Act, 21 U.S.C. section 360bbb-3(b)(1), unless the authorization is terminated or revoked.  Performed at [facility], [address] [place], [place] [address]   Urine culture     Status: Abnormal   Collection Time: 05/21/20  7:13 PM   Specimen: Urine, Random  Result Value Ref Range Status   Specimen Description   Final    URINE, RANDOM Performed at [facility], [address], [place], [place] [address]    Special Requests   Final    NONE Performed at [facility], [address] [place], [place] [address]    Culture MULTIPLE SPECIES PRESENT, SUGGEST RECOLLECTION (A)  Final   Report Status 05/23/2020 FINAL  Final     Time coordinating discharge in minutes: 65  SIGNED:   Debbe Odea, MD  [facility] 05/25/2020, 1:03 PM

## 2020-05-25 NOTE — Progress Notes (Signed)
Discussed discharge instructions including medications and follow appointment.  Encouraged patient and husband to review medications carefully.   Discussed the discontinuation of several medications at this time.  Patient to monitor BP and weight closely and follow up with PCP regarding any changes.     Sent paperwork home with patient.

## 2020-05-26 LAB — CULTURE, BLOOD (ROUTINE X 2)
Culture: NO GROWTH
Culture: NO GROWTH
Special Requests: ADEQUATE
Special Requests: ADEQUATE

## 2020-05-27 ENCOUNTER — Other Ambulatory Visit (HOSPITAL_COMMUNITY): Payer: Self-pay

## 2020-05-27 ENCOUNTER — Encounter (HOSPITAL_COMMUNITY): Payer: Self-pay

## 2020-05-27 NOTE — Progress Notes (Signed)
Today had a home visit with Karen [name].  She said she is still weak but getting better.  She denies any problems other than weakness.  Denies any chest pain, increased shortness of breath, headaches or dizziness.  She has no edema in legs, abdomen is soft.  Lungs sounds has some wheezing, no fluid heard.  At discharge several meds were stopped and some added, went through her meds with her and husband, they have changed according to discharge.  Her husband stated she has appt with Dr Rockey Situ tomorrow, but checked nad she does not, made appt for her for early next week.  Nothing available tomorrow.  Concerns of at discharge form [place] they stopped her fluid pills and potassium.  Advised her if any trouble call the on call doctor and advise them.  She states no weight gain since discharge.  Her husband is monitoring her sugars closely.  She staets urine is clear yellow.  She has been urinating normally.  Trying to watch high sodium foods.  They cook at home, last visit with them of washing canned vegetables off before eating.  Discussed palliative care with them and they are wanting it.  Will ask cardiology to put referral in for it.  Will continue to visit for heart failure, they both have my number in their cell phone to call me with any problems.   [place] [phone]

## 2020-05-28 ENCOUNTER — Other Ambulatory Visit: Payer: Self-pay

## 2020-05-28 ENCOUNTER — Ambulatory Visit
Admission: EM | Admit: 2020-05-28 | Discharge: 2020-05-28 | Disposition: A | Discharge status: Home / Self Care | Payer: Medicare Other | Attending: Emergency Medicine | Admitting: Emergency Medicine

## 2020-05-28 ENCOUNTER — Encounter: Payer: Self-pay | Admitting: Emergency Medicine

## 2020-05-28 DIAGNOSIS — J309 Allergic rhinitis, unspecified: Secondary | ICD-10-CM

## 2020-05-28 MED ORDER — IPRATROPIUM BROMIDE 0.06 % NA SOLN: 2.0000 | Freq: Four times a day (QID) | NASAL | 12 refills | Status: DC

## 2020-05-28 NOTE — ED Provider Notes (Signed)
[facility]    CSN: [id number] Arrival date & time: 05/28/20  1248      History   Chief Complaint Chief Complaint  Patient presents with  . Nasal Congestion  . Cough    HPI Karen [name] is a 75 y.o. female.   HPI   75 year old female here for evaluation of runny nose, cough, and congestion.  Patient reports that her symptoms started 3 days ago.  Patient was just discharged from the hospital after being admitted for sepsis 2 days ago.  Patient was tested for Covid and flu while inpatient and both tests were negative.  Patient reports that she has had a runny nose and postnasal drip with a nonproductive cough.  Patient denies fever, nasal discharge, sinus pain, itchy watery eyes, increase in her shortness of breath, or ear pain or pressure.  Patient was just visited by community paramedic yesterday at home following her discharge.  Past Medical History:  Diagnosis Date  . (HFpEF) heart failure with preserved ejection fraction ([place])    a. 2017 Echo: EF 50%; b. 06/2018 Echo: EF 50-55%; c. 08/2018 Echo: EF 50-55%, Nl RV fxn; d. 09/2019 Echo: EF 55-60%, no rwma, mild LVH, Gr1 DD, nl RV size/fxn, PASP 63.68mHg. Mildly dil LA. Triv MR. Mod AS (AoV 0.94cm^2 VTI; mean grad 17.327mg).  . Acute on chronic respiratory failure with hypoxia and hypercapnia (HCNew Stuyahok11/05/2014  . Anemia   . 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 Asthma   . Cataract   . CKD (chronic kidney disease), stage III (HCFreedom Acres  . COPD (chronic obstructive pulmonary disease) (HCHays   a. 06/2018 tobacco use, home 3L oxygen   . Diabetes mellitus without complication (HCNewton Grove   a. 09/2019 A1C 8.8  . Edema, peripheral 04/20/2014  . GI bleed 06/28/2019  . History of kidney stones   . Hyperlipidemia   . Hypertension   . Iron deficiency anemia 06/22/2014  . Junctional bradycardia    a. In setting of beta blocker therapy.  . Leucocytosis 10/19/2015  . Moderate aortic stenosis    a.  09/2019 Echo: Mod AS (AoV 0.94cm^2 VTI; mean grad  17.41m66m).  . Morbid obesity (HCCStonewall . Overactive bladder   . Primary osteoarthritis of right knee 09/01/2016  . Sciatica 01/07/2015    Patient Active Problem List   Diagnosis Date Noted  . Chronic respiratory failure with hypercapnia (HCCKaumakani3/18/2022  . Severe sepsis (HCCWheeling3/18/2022  . UTI (urinary tract infection) 05/21/2020  . Hyperglycemia due to type 2 diabetes mellitus (HCCLake City3/18/2022  . Abnormal CT of liver 05/21/2020  . 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 05/21/2020  . Morbid obesity (HCCCallaway3/07/2020  . Acute hip pain, left 10/23/2019  . Lumbar stenosis with neurogenic claudication 10/23/2019  . COPD with acute exacerbation (HCCKankakee7/25/2021  . Acute diastolic CHF (congestive heart failure) (HCCBrown City7/25/2021  . (HFpEF) heart failure with preserved ejection fraction (HCCGlen Ridge7/24/2021  . Rectal bleeding 06/28/2019  . Hypokalemia 06/28/2019  . Hyponatremia 06/28/2019  . Type II diabetes mellitus with renal manifestations (HCCElysian4/24/2021  . CKD (chronic kidney disease), stage IIIa 06/28/2019  . Atrial fibrillation, chronic (HCCBrasher Falls4/24/2021  . Pulmonary edema 02/27/2019  . Bradycardia 02/24/2019  . Chronic respiratory failure with hypoxia (HCCNewell2/20/2020  . Atypical chest pain 02/13/2019  . Osteopenia of neck of left femur 11/20/2018  . AVM (arteriovenous malformation) of small bowel, acquired   . Acute gastric ulcer with hemorrhage   . Chronic diastolic  heart failure ([place]) 08/22/2018  . Diarrhea 08/22/2018  . Junctional bradycardia   . Acute on chronic heart failure with preserved ejection fraction (HFpEF) ([place])   . AKI (acute kidney injury) ([place])   . Symptomatic bradycardia 08/05/2018  . Acute on chronic respiratory failure ([place]) 06/25/2018  . 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 diabetes mellitus ([place]) 01/25/2018  . History of non anemic vitamin B12 deficiency 01/25/2018  . Personal history of kidney stones 11/12/2017  . Urge incontinence  11/12/2017  . History of leukocytosis 09/17/2017  . Right ureteral stone 09/15/2017  . Acute GI bleeding   . GI bleed 08/26/2017  . Arthritis 08/10/2017  . Stage 4 chronic kidney disease ([place]) 08/10/2017  . COPD (chronic obstructive pulmonary disease) ([place]) 08/10/2017  . Diabetes mellitus type 2, uncomplicated ([place]) 0000000  . Hypertension 08/10/2017  . Obesity (BMI 35.0-39.9 without comorbidity) 04/11/2017  . Primary osteoarthritis of right knee 09/01/2016  . Leucocytosis 10/19/2015  . 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 Acute on chronic respiratory failure with hypoxia and hypercapnia ([place]) 01/07/2015  . Sciatica 01/07/2015  . Generalized weakness 01/07/2015  . 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 Iron deficiency anemia 06/22/2014  . Microalbuminuria 06/22/2014  . CHF (congestive heart failure) ([place]) 04/20/2014  . Edema, peripheral 04/20/2014    Past Surgical History:  Procedure Laterality Date  . APPENDECTOMY    . CESAREAN SECTION     x3  . CHOLECYSTECTOMY    . COLONOSCOPY WITH PROPOFOL N/A 08/28/2017   Procedure: COLONOSCOPY WITH PROPOFOL;  Surgeon: Lucilla Lame, MD;  Location: [facility] ENDOSCOPY;  Service: Endoscopy;  Laterality: N/A;  . COLONOSCOPY WITH PROPOFOL N/A 08/29/2017   Procedure: COLONOSCOPY WITH PROPOFOL;  Surgeon: Lucilla Lame, MD;  Location: [facility] ENDOSCOPY;  Service: Endoscopy;  Laterality: N/A;  . CYSTOSCOPY W/ URETERAL STENT PLACEMENT Right 09/15/2017   Procedure: CYSTOSCOPY WITH RETROGRADE PYELOGRAM/URETERAL STENT PLACEMENT;  Surgeon: Cleon Gustin, MD;  Location: [facility] ORS;  Service: Urology;  Laterality: Right;  . CYSTOSCOPY/URETEROSCOPY/HOLMIUM LASER/STENT PLACEMENT Right 10/09/2017   Procedure: CYSTOSCOPY/URETEROSCOPY/HOLMIUM LASER/STENT PLACEMENT;  Surgeon: Abbie Sons, MD;  Location: [facility] ORS;  Service: Urology;  Laterality: Right;  right Stent exchange  . ESOPHAGOGASTRODUODENOSCOPY (EGD) WITH PROPOFOL N/A 09/16/2018    Procedure: ESOPHAGOGASTRODUODENOSCOPY (EGD) WITH PROPOFOL;  Surgeon: Lin Landsman, MD;  Location: [place];  Service: Gastroenterology;  Laterality: N/A;  . EYE SURGERY      OB History   No obstetric history on file.      Home Medications    Prior to Admission medications   Medication Sig Start Date End Date Taking? Authorizing Provider  ipratropium (ATROVENT) 0.06 % nasal spray Place 2 sprays into both nostrils 4 (four) times daily. 05/28/20  Yes Margarette Canada, NP  acetaminophen (TYLENOL) 500 MG tablet Take 1-2 tablets (500-1,000 mg total) by mouth every 6 (six) hours as needed for mild pain or fever. Do not take more than 4 grams a day 05/25/20   Debbe Odea, MD  albuterol (VENTOLIN HFA) 108 (90 Base) MCG/ACT inhaler Inhale 2 puffs into the lungs every 6 (six) hours as needed for wheezing or shortness of breath.     [provider]  atorvastatin (LIPITOR) 10 MG tablet Take 10 mg by mouth daily.     [provider]  cephALEXin (KEFLEX) 500 MG capsule Take 1 capsule (500 mg total) by mouth every 12 (twelve) hours. 05/25/20   Debbe Odea, MD  ELIQUIS 5 MG TABS tablet Take 1 tablet  by mouth twice daily 03/11/20   [name], MD  esomeprazole (NEXIUM) 40 MG capsule Take 40 mg by mouth daily. 12/17/18   [provider]  Ferrous Sulfate (IRON) 325 (65 Fe) MG TABS Take 1 tablet by mouth daily. 09/14/17   [provider]  fluticasone (FLONASE) 50 MCG/ACT nasal spray Place 2 sprays into both nostrils daily. 11/15/18   [provider]  Fluticasone-Umeclidin-Vilant 100-62.5-25 MCG/INH AEPB Inhale 1 puff into the lungs daily. 12/25/17   [provider]  insulin glargine (LANTUS SOLOSTAR) 100 UNIT/ML Solostar Pen Inject 45 Units into the skin at bedtime. Patient not taking: Reported on 05/27/2020 05/25/20   Debbe Odea, MD  insulin lispro (HUMALOG KWIKPEN) 100 UNIT/ML KwikPen Inject 10 Units into the skin 3 (three) times daily.  05/25/20   Debbe Odea, MD  ipratropium-albuterol (DUONEB) 0.5-2.5 (3) MG/3ML SOLN Inhale 3 mLs into the lungs 4 (four) times daily as needed (shortness of breath or wheezing).     [provider]  losartan (COZAAR) 100 MG tablet Take 1 tablet (100 mg total) by mouth daily. 05/25/20 05/25/21  Debbe Odea, MD  metFORMIN (GLUCOPHAGE) 500 MG tablet Take 1,000 mg by mouth 2 (two) times daily with a meal.     [provider]  montelukast (SINGULAIR) 10 MG tablet Take 10 mg by mouth at bedtime.    [provider]  OZEMPIC, 0.25 OR 0.5 MG/DOSE, 2 MG/1.5ML SOPN SMARTSIG:0.375 Milliliter(s) SUB-Q Once a Week 05/12/20   [provider]  vitamin B-12 (CYANOCOBALAMIN) 500 MCG tablet Take 500 mcg by mouth daily.    [provider]    Family History Family History  Problem Relation Age of Onset  . Other Mother        unknown medical history  . Other Father        unknown medical history    Social History Social History   Tobacco Use  . Smoking status: Former Smoker    Packs/day: 1.00    Years: 20.00    Pack years: 20.00    Quit date: 12/04/1992    Years since quitting: 27.4  . Smokeless tobacco: Never Used  Vaping Use  . Vaping Use: Never used  Substance Use Topics  . Alcohol use: No  . Drug use: No     Allergies   Ace inhibitors, Beta adrenergic blockers, Gabapentin, Lisinopril, Lyrica [pregabalin], and Shrimp [shellfish allergy]   Review of Systems Review of Systems  Constitutional: Negative for fever.  HENT: Positive for postnasal drip and rhinorrhea. Negative for congestion, ear pain and sore throat.   Eyes: Negative for discharge and itching.  Respiratory: Positive for cough. Negative for shortness of breath and wheezing.   Skin: Negative for rash.  Hematological: Negative.      Physical Exam Triage Vital Signs ED Triage Vitals  Enc Vitals Group     BP 05/28/20 1313 (!) 104/59     Pulse Rate 05/28/20 1313 96     Resp 05/28/20  1313 16     Temp 05/28/20 1313 98.1 F (36.7 C)     Temp Source 05/28/20 1313 Oral     SpO2 05/28/20 1313 96 %     Weight 05/28/20 1311 (!) 316 lb (143.3 kg)     Height 05/28/20 1311 '5\' 3"'$  (1.6 m)     Head Circumference --      Peak Flow --      Pain Score 05/28/20 1311 0     Pain Loc --  Pain Edu? --      Excl. in [place]? --    No data found.  Updated Vital Signs BP (!) 104/59 (BP Location: Left Arm)   Pulse 96   Temp 98.1 F (36.7 C) (Oral)   Resp 16   Ht '5\' 3"'$  (1.6 m)   Wt (!) 316 lb (143.3 kg)   SpO2 96%   BMI 55.98 kg/m   Visual Acuity Right Eye Distance:   Left Eye Distance:   Bilateral Distance:    Right Eye Near:   Left Eye Near:    Bilateral Near:     Physical Exam Vitals and nursing note reviewed.  Constitutional:      General: She is not in acute distress.    Appearance: Normal appearance. She is not ill-appearing.  HENT:     Head: Normocephalic and atraumatic.     Right Ear: Tympanic membrane, ear canal and external ear normal.     Left Ear: Tympanic membrane, ear canal and external ear normal.     Nose: Congestion and rhinorrhea present.     Mouth/Throat:     Mouth: Mucous membranes are moist.     Pharynx: Oropharynx is clear. No posterior oropharyngeal erythema.  Cardiovascular:     Rate and Rhythm: Normal rate and regular rhythm.     Pulses: Normal pulses.     Heart sounds: Normal heart sounds. No murmur heard. No gallop.   Pulmonary:     Effort: Pulmonary effort is normal.     Breath sounds: Normal breath sounds. No wheezing, rhonchi or rales.  Skin:    General: Skin is warm and dry.     Capillary Refill: Capillary refill takes less than 2 seconds.     Findings: No erythema.  Neurological:     General: No focal deficit present.     Mental Status: She is alert and oriented to person, place, and time.  Psychiatric:        Mood and Affect: Mood normal.        Behavior: Behavior normal.        Thought Content: Thought content normal.         Judgment: Judgment normal.      UC Treatments / Results  Labs (all labs ordered are listed, but only abnormal results are displayed) Labs Reviewed - No data to display  EKG   Radiology No results found.  Procedures Procedures (including critical care time)  Medications Ordered in UC Medications - No data to display  Initial Impression / Assessment and Plan / UC Course  I have reviewed the triage vital signs and the nursing notes.  Pertinent labs & imaging results that were available during my care of the patient were reviewed by me and considered in my medical decision making (see chart for details).   Patient is here for evaluation of runny nose cough that been going on for the last 3 days.  Patient was just discharged from [facility] where she was treated for sepsis 2 days ago.  While in the hospital she was taken off of all of her diuretics secondary to dehydration and remains off of her diuretics at present.  She has a history of COPD with hypoxic respiratory failure and is on 3 L of oxygen by nasal cannula, CHF, A. fib, and hypertension.  Patient's physical exam reveals pale and edematous nasal mucosa with clear nasal discharge.  Posterior oropharynx is pink and moist but there is evidence of clear postnasal drip and some  thicker white mucus in posterior oropharynx.  No cervical lymphadenopathy appreciated.  Lungs are clear to auscultation in all fields without rales wheezes, rales, or rhonchi appreciated on exam.  Patient's presentation is more consistent with an allergic rhinitis presentation.  We will treat patient with ipratropium nasal spray, and cetirizine at bedtime 10 mg.   Final Clinical Impressions(s) / UC Diagnoses   Final diagnoses:  Allergic rhinitis, unspecified seasonality, unspecified trigger     Discharge Instructions     Use the Atrovent nasal spray, 2 squirts in each nostril 4 times a day as needed for runny nose and postnasal drip.  Take  over-the-counter cetirizine, 10 mg at bedtime, for relief of your allergy symptoms.  Continue all of your current medications.    ED Prescriptions    Medication Sig Dispense Auth. Provider   ipratropium (ATROVENT) 0.06 % nasal spray Place 2 sprays into both nostrils 4 (four) times daily. 15 mL Margarette Canada, NP     PDMP not reviewed this encounter.   Margarette Canada, NP 05/28/20 1400

## 2020-05-28 NOTE — ED Triage Notes (Signed)
Patient c/o runny nose, cough, congestion that started 3 days ago.  Patient denies fevers.

## 2020-05-28 NOTE — Discharge Instructions (Addendum)
Use the Atrovent nasal spray, 2 squirts in each nostril 4 times a day as needed for runny nose and postnasal drip.  Take over-the-counter cetirizine, 10 mg at bedtime, for relief of your allergy symptoms.  Continue all of your current medications.

## 2020-05-31 ENCOUNTER — Other Ambulatory Visit: Payer: Self-pay

## 2020-05-31 ENCOUNTER — Encounter: Payer: Self-pay | Admitting: Cardiovascular Disease

## 2020-05-31 ENCOUNTER — Ambulatory Visit (INDEPENDENT_AMBULATORY_CARE_PROVIDER_SITE_OTHER): Payer: Medicare Other | Admitting: Cardiovascular Disease

## 2020-05-31 VITALS — BP 130/60 | HR 79 | Ht 63.0 in | Wt 214.0 lb

## 2020-05-31 DIAGNOSIS — I5031 Acute diastolic (congestive) heart failure: Secondary | ICD-10-CM

## 2020-05-31 DIAGNOSIS — R001 Bradycardia, unspecified: Secondary | ICD-10-CM

## 2020-05-31 DIAGNOSIS — E1165 Type 2 diabetes mellitus with hyperglycemia: Secondary | ICD-10-CM

## 2020-05-31 DIAGNOSIS — I10 Essential (primary) hypertension: Secondary | ICD-10-CM | POA: Diagnosis not present

## 2020-05-31 DIAGNOSIS — I482 Chronic atrial fibrillation, unspecified: Secondary | ICD-10-CM

## 2020-05-31 DIAGNOSIS — Z794 Long term (current) use of insulin: Secondary | ICD-10-CM

## 2020-05-31 DIAGNOSIS — J9612 Chronic respiratory failure with hypercapnia: Secondary | ICD-10-CM

## 2020-05-31 MED ORDER — HYDROCHLOROTHIAZIDE 25 MG PO TABS: 25.0000 mg | ORAL_TABLET | Freq: Every day | ORAL | 3 refills | Status: DC

## 2020-05-31 NOTE — Progress Notes (Signed)
Date:  05/31/2020   ID:  Karen [name], DOB 1945/03/13, MRN [id number]  Patient Location:  [place] [address]   Provider location:   [facility], [place] office  PCP:  Earlie Counts, FNP  Cardiologist:  Arvid Right [facility]   Chief Complaint  Patient presents with  . Other    Hospital follow up - Meds reviewed verbally with patient.     History of Present Illness:    Karen [name] is a 75 y.o. female  past medical history of HFpEF,  pulmonary hypertension,  chronic hypoxic respiratory failure with hypoxia on supplemental oxygen,  COPD secondary to prior tobacco abuse,  DM 2,  hypertension,  hyperlipidemia,  morbid obesity,  recent fall  [facility] admission early June 2020-  volume overload and bradycardia. She presents for routine follow-up of her atrial fibrillation and heart failure  Last office visit August 2021 In the hospital March 2022 for sepsis, records reviewed Urinary tract infection, was hypotensive, sodium 129 potassium of 3 creatinine 1.84 Treated with broad-spectrum antibiotics, IV fluids  Hemoglobin A1c 11.6 Insulin was increased Denies any indiscretion with her diet HCTZ held in the hospital  Weight 214 today Home 210 to 214  Legs weak, presents in wheelchair  EKG personally reviewed by myself on todays visit Normal sinus rhythm rate 83 bpm APCs   Other past medical history reviewed In the hospital 06/2019,  Hyponatremia:Na 127, up to 133 with fluids Rectal bleeding:suspected this is diverticular bleed. Hgb 10.9 -->10.0 -->9.3--8.7. Colonoscopy 08/2017 - Diverticulosis in the descending colon, in the transverse colon and in the ascending colon. A1c8.4,   Junctional bradycardia on arrival to the hospital with hypotension Markedly elevated right heart pressures on echocardiogram,severe pulmonary HTN,  BNP was 800  Treated with Lasix IV twice daily Poor renal function on arrival,  suspected cardiorenal syndrome Improvement of renal function with diuresis Improvement of her abdominal bloating, cough improved, leg swelling improved    Past Medical History:  Diagnosis Date  . (HFpEF) heart failure with preserved ejection fraction ([place])    a. 2017 Echo: EF 50%; b. 06/2018 Echo: EF 50-55%; c. 08/2018 Echo: EF 50-55%, Nl RV fxn; d. 09/2019 Echo: EF 55-60%, no rwma, mild LVH, Gr1 DD, nl RV size/fxn, PASP 63.41mHg. Mildly dil LA. Triv MR. Mod AS (AoV 0.94cm^2 VTI; mean grad 17.3536mg).  . Acute on chronic respiratory failure with hypoxia and hypercapnia (HCLeland Grove11/05/2014  . Anemia   . 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 Asthma   . Cataract   . CKD (chronic kidney disease), stage III (HCMinor Hill  . COPD (chronic obstructive pulmonary disease) (HCWinnsboro   a. 06/2018 tobacco use, home 3L oxygen   . Diabetes mellitus without complication (HCKennett Square   a. 09/2019 A1C 8.8  . Edema, peripheral 04/20/2014  . GI bleed 06/28/2019  . History of kidney stones   . Hyperlipidemia   . Hypertension   . Iron deficiency anemia 06/22/2014  . Junctional bradycardia    a. In setting of beta blocker therapy.  . Leucocytosis 10/19/2015  . Moderate aortic stenosis    a.  09/2019 Echo: Mod AS (AoV 0.94cm^2 VTI; mean grad 17.36m84m).  . Morbid obesity (HCCHarrodsburg . Overactive bladder   . Primary osteoarthritis of right knee 09/01/2016  . Sciatica 01/07/2015   Past Surgical History:  Procedure Laterality Date  . APPENDECTOMY    . CESAREAN SECTION     x3  .  CHOLECYSTECTOMY    . COLONOSCOPY WITH PROPOFOL N/A 08/28/2017   Procedure: COLONOSCOPY WITH PROPOFOL;  Surgeon: Lucilla Lame, MD;  Location: [facility] ENDOSCOPY;  Service: Endoscopy;  Laterality: N/A;  . COLONOSCOPY WITH PROPOFOL N/A 08/29/2017   Procedure: COLONOSCOPY WITH PROPOFOL;  Surgeon: Lucilla Lame, MD;  Location: [facility] ENDOSCOPY;  Service: Endoscopy;  Laterality: N/A;  . CYSTOSCOPY W/ URETERAL STENT PLACEMENT Right 09/15/2017   Procedure: CYSTOSCOPY WITH RETROGRADE  PYELOGRAM/URETERAL STENT PLACEMENT;  Surgeon: Cleon Gustin, MD;  Location: [facility] ORS;  Service: Urology;  Laterality: Right;  . CYSTOSCOPY/URETEROSCOPY/HOLMIUM LASER/STENT PLACEMENT Right 10/09/2017   Procedure: CYSTOSCOPY/URETEROSCOPY/HOLMIUM LASER/STENT PLACEMENT;  Surgeon: Abbie Sons, MD;  Location: [facility] ORS;  Service: Urology;  Laterality: Right;  right Stent exchange  . ESOPHAGOGASTRODUODENOSCOPY (EGD) WITH PROPOFOL N/A 09/16/2018   Procedure: ESOPHAGOGASTRODUODENOSCOPY (EGD) WITH PROPOFOL;  Surgeon: Lin Landsman, MD;  Location: [place];  Service: Gastroenterology;  Laterality: N/A;  . EYE SURGERY       Current Meds  Medication Sig  . acetaminophen (TYLENOL) 500 MG tablet Take 1-2 tablets (500-1,000 mg total) by mouth every 6 (six) hours as needed for mild pain or fever. Do not take more than 4 grams a day  . albuterol (VENTOLIN HFA) 108 (90 Base) MCG/ACT inhaler Inhale 2 puffs into the lungs every 6 (six) hours as needed for wheezing or shortness of breath.   Marland Kitchen atorvastatin (LIPITOR) 10 MG tablet Take 10 mg by mouth daily.   Marland Kitchen ELIQUIS 5 MG TABS tablet Take 1 tablet by mouth twice daily  . esomeprazole (NEXIUM) 40 MG capsule Take 40 mg by mouth daily.  . Ferrous Sulfate (IRON) 325 (65 Fe) MG TABS Take 1 tablet by mouth daily.  . fluticasone (FLONASE) 50 MCG/ACT nasal spray Place 2 sprays into both nostrils daily.  . Fluticasone-Umeclidin-Vilant 100-62.5-25 MCG/INH AEPB Inhale 1 puff into the lungs daily.  . insulin glargine (LANTUS SOLOSTAR) 100 UNIT/ML Solostar Pen Inject 45 Units into the skin at bedtime.  . insulin lispro (HUMALOG KWIKPEN) 100 UNIT/ML KwikPen Inject 10 Units into the skin 3 (three) times daily.  Marland Kitchen ipratropium (ATROVENT) 0.06 % nasal spray Place 2 sprays into both nostrils 4 (four) times daily.  Marland Kitchen losartan (COZAAR) 100 MG tablet Take 1 tablet (100 mg total) by mouth daily.  . metFORMIN (GLUCOPHAGE) 500 MG tablet Take 1,000 mg by mouth 2 (two) times  daily with a meal.   . montelukast (SINGULAIR) 10 MG tablet Take 10 mg by mouth at bedtime.  Marland Kitchen OZEMPIC, 0.25 OR 0.5 MG/DOSE, 2 MG/1.5ML SOPN SMARTSIG:0.375 Milliliter(s) SUB-Q Once a Week  . vitamin B-12 (CYANOCOBALAMIN) 500 MCG tablet Take 500 mcg by mouth daily.     Allergies:   Ace inhibitors, Beta adrenergic blockers, Gabapentin, Lisinopril, Lyrica [pregabalin], and Shrimp [shellfish allergy]   Social History   Tobacco Use  . Smoking status: Former Smoker    Packs/day: 1.00    Years: 20.00    Pack years: 20.00    Quit date: 12/04/1992    Years since quitting: 27.5  . Smokeless tobacco: Never Used  Vaping Use  . Vaping Use: Never used  Substance Use Topics  . Alcohol use: No  . Drug use: No      Family Hx: The patient's family history includes Other in her father and mother.  ROS:   Please see the history of present illness.    Review of Systems  Constitutional: Negative.   HENT: Negative.   Respiratory: Positive for shortness of breath.  Cardiovascular: Positive for leg swelling.  Gastrointestinal: Negative.   Musculoskeletal: Negative.   Neurological: Negative.   Psychiatric/Behavioral: Negative.   All other systems reviewed and are negative.     Labs/Other Tests and Data Reviewed:    Recent Labs: 06/29/2019: TSH 1.440 05/21/2020: B Natriuretic Peptide 25.9 05/22/2020: ALT 10; Magnesium 2.2 05/23/2020: Hemoglobin 10.0; Platelets 248 05/24/2020: BUN 28; Creatinine, Ser 0.83; Potassium 4.5; Sodium 134   Recent Lipid Panel No results found for: CHOL, TRIG, HDL, CHOLHDL, LDLCALC, LDLDIRECT  Wt Readings from Last 3 Encounters:  05/31/20 214 lb (97.1 kg)  05/28/20 (!) 316 lb (143.3 kg)  05/27/20 212 lb (96.2 kg)     Exam:    BP 130/60 (BP Location: Right Arm, Patient Position: Sitting, Cuff Size: Normal)   Pulse 79   Ht '5\' 3"'$  (1.6 m)   Wt 214 lb (97.1 kg)   SpO2 95% Comment: With oxygen  BMI 37.91 kg/m   Constitutional:  oriented to person, place, and  time. No distress.  Presenting in a wheelchair HENT:  Head: Grossly normal Eyes:  no discharge. No scleral icterus.  Neck: No JVD, no carotid bruits  Cardiovascular: Regular rate and rhythm, no murmurs appreciated Pulmonary/Chest: Clear to auscultation bilaterally, no wheezes or rails Abdominal: Soft.  no distension.  no tenderness.  Musculoskeletal: Normal range of motion Neurological:  normal muscle tone. Coordination normal. No atrophy Skin: Skin warm and dry Psychiatric: normal affect, pleasant   ASSESSMENT & PLAN:    Chronic diastolic heart failure (HCC) - Weight at goal 214,  Will restart HCTZ (stopped in hospital for ATN)  Essential hypertension - Restart HCTZ as above  Type 2 diabetes mellitus without complication, with long-term current use of insulin ([place]) - Long history of poor control diabetes, We have encouraged continued exercise, careful diet management in an effort to lose weight. Works with endocrine  Junctional bradycardia - All beta-blockers, calcium channel blockers held, Normal sinus rhythm today, stable  Extensive records reviewed, recent sepsis, dehydration, ATN  Total encounter time more than 35 minutes  Greater than 50% was spent in counseling and coordination of care with the patient     Signed, Ida Rogue, MD  05/31/2020 1:55 PM    [facility] [id number], [place], [place] [id number]

## 2020-05-31 NOTE — Patient Instructions (Signed)
Medication Instructions:  Start HCTZ 25 mg daily  If you need a refill on your cardiac medications before your next appointment, please call your pharmacy.    Lab work: No new labs needed   If you have labs (blood work) drawn today and your tests are completely normal, you will receive your results only by: Marland Kitchen MyChart Message (if you have MyChart) OR . A paper copy in the mail If you have any lab test that is abnormal or we need to change your treatment, we will call you to review the results.   Testing/Procedures: No new testing needed   Follow-Up: At [facility], you and your health needs are our priority.  As part of our continuing mission to provide you with exceptional heart care, we have created designated Provider Care Teams.  These Care Teams include your primary Cardiologist (physician) and Advanced Practice Providers (APPs -  Physician Assistants and Nurse Practitioners) who all work together to provide you with the care you need, when you need it.  . You will need a follow up appointment in 6 months  . Providers on your designated Care Team:   . Murray Hodgkins, NP . Christell Faith, PA-C . Marrianne Mood, PA-C  Any Other Special Instructions Will Be Listed Below (If Applicable).  COVID-19 Vaccine Information can be found at: [email or web address].uk For questions related to vaccine distribution or appointments, please email [email or web address] or call [phone].

## 2020-06-01 ENCOUNTER — Ambulatory Visit: Payer: Medicare Other | Admitting: Family

## 2020-06-07 ENCOUNTER — Encounter (HOSPITAL_COMMUNITY): Payer: Self-pay

## 2020-06-07 NOTE — Progress Notes (Signed)
Was contacted by Dr Rockey Situ staff advising that their office will no longer make referral to any Home Health services.  Contacted NP Britt and left message to see if she could do a referral for Palliative Care.   [place] [phone]

## 2020-06-10 LAB — BLOOD GAS, VENOUS
Acid-Base Excess: 14.7 mmol/L — ABNORMAL HIGH (ref 0.0–2.0)
Bicarbonate: 40.5 mmol/L — ABNORMAL HIGH (ref 20.0–28.0)
O2 Saturation: 57.4 %
Patient temperature: 37
pCO2, Ven: 61 mmHg — ABNORMAL HIGH (ref 44.0–60.0)
pH, Ven: 7.43 (ref 7.250–7.430)

## 2020-06-14 ENCOUNTER — Encounter (HOSPITAL_COMMUNITY): Payer: Self-pay

## 2020-06-14 NOTE — Progress Notes (Signed)
Attempted to contact Karen [name] due to her missing her PCP appt last week.  [facility] contacted me today and advised she was a no show and therefore they need to see her in person to order Palliative Care.  Attempted to contact [name] with no answer, left message.  Attempted her husband also with no answer.  Will continue to try to reach and get her PCP rescheduled.   [name] [phone]

## 2020-06-15 ENCOUNTER — Telehealth (HOSPITAL_COMMUNITY): Payer: Self-pay

## 2020-06-15 NOTE — Telephone Encounter (Signed)
Spoke with [employer] husband Ray and advised he needs to reschedule Shirleys appt with [facility], that she missed her appt last week.  He was confused and thought it was Dr Pasty Arch appt she missed.  He states will call today and reschedule it, advised that they will also discuss palliative care consult also.    [place] [phone]

## 2020-06-17 ENCOUNTER — Inpatient Hospital Stay
Admission: EM | Admit: 2020-06-17 | Discharge: 2020-06-21 | DRG: 291 | Disposition: A | Discharge status: Home Health | Payer: Medicare Other | Attending: Student | Admitting: Student

## 2020-06-17 ENCOUNTER — Emergency Department: Payer: Medicare Other

## 2020-06-17 ENCOUNTER — Other Ambulatory Visit: Payer: Self-pay

## 2020-06-17 DIAGNOSIS — K219 Gastro-esophageal reflux disease without esophagitis: Secondary | ICD-10-CM | POA: Diagnosis present

## 2020-06-17 DIAGNOSIS — Z87891 Personal history of nicotine dependence: Secondary | ICD-10-CM

## 2020-06-17 DIAGNOSIS — Z6837 Body mass index (BMI) 37.0-37.9, adult: Secondary | ICD-10-CM | POA: Diagnosis not present

## 2020-06-17 DIAGNOSIS — I509 Heart failure, unspecified: Secondary | ICD-10-CM | POA: Diagnosis not present

## 2020-06-17 DIAGNOSIS — Z20822 Contact with and (suspected) exposure to covid-19: Secondary | ICD-10-CM | POA: Diagnosis present

## 2020-06-17 DIAGNOSIS — Z7901 Long term (current) use of anticoagulants: Secondary | ICD-10-CM

## 2020-06-17 DIAGNOSIS — I5033 Acute on chronic diastolic (congestive) heart failure: Secondary | ICD-10-CM | POA: Diagnosis present

## 2020-06-17 DIAGNOSIS — Z888 Allergy status to other drugs, medicaments and biological substances status: Secondary | ICD-10-CM | POA: Diagnosis not present

## 2020-06-17 DIAGNOSIS — Z79899 Other long term (current) drug therapy: Secondary | ICD-10-CM | POA: Diagnosis not present

## 2020-06-17 DIAGNOSIS — I5031 Acute diastolic (congestive) heart failure: Secondary | ICD-10-CM | POA: Diagnosis not present

## 2020-06-17 DIAGNOSIS — R0602 Shortness of breath: Secondary | ICD-10-CM | POA: Diagnosis not present

## 2020-06-17 DIAGNOSIS — Z91013 Allergy to seafood: Secondary | ICD-10-CM | POA: Diagnosis not present

## 2020-06-17 DIAGNOSIS — I10 Essential (primary) hypertension: Secondary | ICD-10-CM

## 2020-06-17 DIAGNOSIS — I480 Paroxysmal atrial fibrillation: Secondary | ICD-10-CM | POA: Diagnosis present

## 2020-06-17 DIAGNOSIS — N179 Acute kidney failure, unspecified: Secondary | ICD-10-CM | POA: Diagnosis not present

## 2020-06-17 DIAGNOSIS — M1711 Unilateral primary osteoarthritis, right knee: Secondary | ICD-10-CM | POA: Diagnosis present

## 2020-06-17 DIAGNOSIS — E1122 Type 2 diabetes mellitus with diabetic chronic kidney disease: Secondary | ICD-10-CM | POA: Diagnosis present

## 2020-06-17 DIAGNOSIS — I130 Hypertensive heart and chronic kidney disease with heart failure and stage 1 through stage 4 chronic kidney disease, or unspecified chronic kidney disease: Secondary | ICD-10-CM | POA: Diagnosis present

## 2020-06-17 DIAGNOSIS — R531 Weakness: Secondary | ICD-10-CM

## 2020-06-17 DIAGNOSIS — N1831 Chronic kidney disease, stage 3a: Secondary | ICD-10-CM | POA: Diagnosis present

## 2020-06-17 DIAGNOSIS — E669 Obesity, unspecified: Secondary | ICD-10-CM | POA: Diagnosis present

## 2020-06-17 DIAGNOSIS — E785 Hyperlipidemia, unspecified: Secondary | ICD-10-CM

## 2020-06-17 DIAGNOSIS — Z7984 Long term (current) use of oral hypoglycemic drugs: Secondary | ICD-10-CM

## 2020-06-17 DIAGNOSIS — I350 Nonrheumatic aortic (valve) stenosis: Secondary | ICD-10-CM | POA: Diagnosis not present

## 2020-06-17 DIAGNOSIS — I272 Pulmonary hypertension, unspecified: Secondary | ICD-10-CM | POA: Diagnosis present

## 2020-06-17 DIAGNOSIS — Z87442 Personal history of urinary calculi: Secondary | ICD-10-CM | POA: Diagnosis not present

## 2020-06-17 DIAGNOSIS — R001 Bradycardia, unspecified: Secondary | ICD-10-CM | POA: Diagnosis present

## 2020-06-17 DIAGNOSIS — Z9049 Acquired absence of other specified parts of digestive tract: Secondary | ICD-10-CM

## 2020-06-17 DIAGNOSIS — E1165 Type 2 diabetes mellitus with hyperglycemia: Secondary | ICD-10-CM

## 2020-06-17 DIAGNOSIS — Z794 Long term (current) use of insulin: Secondary | ICD-10-CM | POA: Diagnosis not present

## 2020-06-17 DIAGNOSIS — E611 Iron deficiency: Secondary | ICD-10-CM | POA: Diagnosis present

## 2020-06-17 DIAGNOSIS — J9621 Acute and chronic respiratory failure with hypoxia: Secondary | ICD-10-CM

## 2020-06-17 DIAGNOSIS — D631 Anemia in chronic kidney disease: Secondary | ICD-10-CM | POA: Diagnosis present

## 2020-06-17 DIAGNOSIS — J449 Chronic obstructive pulmonary disease, unspecified: Secondary | ICD-10-CM | POA: Diagnosis present

## 2020-06-17 DIAGNOSIS — Z9981 Dependence on supplemental oxygen: Secondary | ICD-10-CM

## 2020-06-17 DIAGNOSIS — E1169 Type 2 diabetes mellitus with other specified complication: Secondary | ICD-10-CM | POA: Diagnosis present

## 2020-06-17 LAB — CBC
HCT: 30.8 % — ABNORMAL LOW (ref 36.0–46.0)
Hemoglobin: 10 g/dL — ABNORMAL LOW (ref 12.0–15.0)
MCH: 29.1 pg (ref 26.0–34.0)
MCHC: 32.5 g/dL (ref 30.0–36.0)
MCV: 89.5 fL (ref 80.0–100.0)
Platelets: 253 10*3/uL (ref 150–400)
RBC: 3.44 MIL/uL — ABNORMAL LOW (ref 3.87–5.11)
RDW: 14.6 % (ref 11.5–15.5)
WBC: 10.3 10*3/uL (ref 4.0–10.5)
nRBC: 0 % (ref 0.0–0.2)

## 2020-06-17 LAB — TROPONIN I (HIGH SENSITIVITY)
Troponin I (High Sensitivity): 11 ng/L (ref <18)
Troponin I (High Sensitivity): 11 ng/L (ref <18)

## 2020-06-17 LAB — CBG MONITORING, ED: Glucose-Capillary: 172 mg/dL — ABNORMAL HIGH (ref 70–99)

## 2020-06-17 LAB — BASIC METABOLIC PANEL
Anion gap: 8 (ref 5–15)
BUN: 18 mg/dL (ref 8–23)
CO2: 27 mmol/L (ref 22–32)
Calcium: 9.1 mg/dL (ref 8.9–10.3)
Chloride: 99 mmol/L (ref 98–111)
Creatinine, Ser: 0.86 mg/dL (ref 0.44–1.00)
GFR, Estimated: >60 mL/min (ref >60)
Glucose, Bld: 181 mg/dL — ABNORMAL HIGH (ref 70–99)
Potassium: 4.6 mmol/L (ref 3.5–5.1)
Sodium: 134 mmol/L — ABNORMAL LOW (ref 135–145)

## 2020-06-17 MED ORDER — MAGNESIUM HYDROXIDE 400 MG/5ML PO SUSP: 30.0000 mL | Freq: Every day | ORAL | Status: DC | PRN

## 2020-06-17 MED ORDER — ONDANSETRON HCL 4 MG PO TABS: 4.0000 mg | ORAL_TABLET | Freq: Four times a day (QID) | ORAL | Status: DC | PRN

## 2020-06-17 MED ORDER — ACETAMINOPHEN 650 MG RE SUPP: 650.0000 mg | Freq: Four times a day (QID) | RECTAL | Status: DC | PRN

## 2020-06-17 MED ORDER — HYDROCHLOROTHIAZIDE 25 MG PO TABS
25.0000 mg | ORAL_TABLET | Freq: Every day | ORAL | Status: DC
  Administered 2020-06-18: 25 mg via ORAL
  Filled 2020-06-17: qty 1

## 2020-06-17 MED ORDER — FLUTICASONE PROPIONATE 50 MCG/ACT NA SUSP
2.0000 | Freq: Every day | NASAL | Status: DC
  Administered 2020-06-18 – 2020-06-21 (×4): 2 via NASAL
  Filled 2020-06-17: qty 16

## 2020-06-17 MED ORDER — ALBUTEROL SULFATE HFA 108 (90 BASE) MCG/ACT IN AERS
2.0000 | INHALATION_SPRAY | Freq: Four times a day (QID) | RESPIRATORY_TRACT | Status: DC | PRN
  Filled 2020-06-17: qty 6.7

## 2020-06-17 MED ORDER — INSULIN GLARGINE 100 UNIT/ML ~~LOC~~ SOLN
45.0000 [IU] | Freq: Every day | SUBCUTANEOUS | Status: DC
  Administered 2020-06-18 – 2020-06-20 (×4): 45 [IU] via SUBCUTANEOUS
  Filled 2020-06-17 (×6): qty 0.45

## 2020-06-17 MED ORDER — LOSARTAN POTASSIUM 50 MG PO TABS
100.0000 mg | ORAL_TABLET | Freq: Every day | ORAL | Status: DC
  Administered 2020-06-18 – 2020-06-20 (×3): 100 mg via ORAL
  Filled 2020-06-17 (×3): qty 2

## 2020-06-17 MED ORDER — FUROSEMIDE 10 MG/ML IJ SOLN
40.0000 mg | Freq: Two times a day (BID) | INTRAMUSCULAR | Status: DC
  Administered 2020-06-18: 40 mg via INTRAVENOUS
  Filled 2020-06-17: qty 4

## 2020-06-17 MED ORDER — FERROUS SULFATE 325 (65 FE) MG PO TABS
325.0000 mg | ORAL_TABLET | Freq: Every day | ORAL | Status: DC
  Administered 2020-06-18 – 2020-06-21 (×4): 325 mg via ORAL
  Filled 2020-06-17 (×4): qty 1

## 2020-06-17 MED ORDER — ACETAMINOPHEN 325 MG PO TABS
650.0000 mg | ORAL_TABLET | Freq: Four times a day (QID) | ORAL | Status: DC | PRN
  Administered 2020-06-18 – 2020-06-21 (×5): 650 mg via ORAL
  Filled 2020-06-17 (×5): qty 2

## 2020-06-17 MED ORDER — TRAZODONE HCL 50 MG PO TABS
25.0000 mg | ORAL_TABLET | Freq: Every evening | ORAL | Status: DC | PRN
  Administered 2020-06-19: 25 mg via ORAL
  Filled 2020-06-17: qty 1

## 2020-06-17 MED ORDER — VITAMIN B-12 1000 MCG PO TABS
500.0000 ug | ORAL_TABLET | Freq: Every day | ORAL | Status: DC
  Administered 2020-06-18 – 2020-06-21 (×4): 500 ug via ORAL
  Filled 2020-06-17: qty 0.5
  Filled 2020-06-17 (×3): qty 1

## 2020-06-17 MED ORDER — ATORVASTATIN CALCIUM 10 MG PO TABS
10.0000 mg | ORAL_TABLET | Freq: Every day | ORAL | Status: DC
  Administered 2020-06-18 – 2020-06-21 (×4): 10 mg via ORAL
  Filled 2020-06-17 (×4): qty 1

## 2020-06-17 MED ORDER — IPRATROPIUM BROMIDE 0.06 % NA SOLN
2.0000 | Freq: Four times a day (QID) | NASAL | Status: DC
  Administered 2020-06-18 – 2020-06-21 (×15): 2 via NASAL
  Filled 2020-06-17: qty 15

## 2020-06-17 MED ORDER — ONDANSETRON HCL 4 MG/2ML IJ SOLN: 4.0000 mg | Freq: Four times a day (QID) | INTRAMUSCULAR | Status: DC | PRN

## 2020-06-17 MED ORDER — PANTOPRAZOLE SODIUM 40 MG PO TBEC
40.0000 mg | DELAYED_RELEASE_TABLET | Freq: Every day | ORAL | Status: DC
  Administered 2020-06-18 – 2020-06-21 (×4): 40 mg via ORAL
  Filled 2020-06-17 (×4): qty 1

## 2020-06-17 MED ORDER — APIXABAN 5 MG PO TABS
5.0000 mg | ORAL_TABLET | Freq: Two times a day (BID) | ORAL | Status: DC
  Administered 2020-06-18 – 2020-06-21 (×8): 5 mg via ORAL
  Filled 2020-06-17 (×8): qty 1

## 2020-06-17 MED ORDER — FUROSEMIDE 10 MG/ML IJ SOLN
40.0000 mg | Freq: Once | INTRAMUSCULAR | Status: AC
  Administered 2020-06-17: 40 mg via INTRAVENOUS
  Filled 2020-06-17: qty 4

## 2020-06-17 MED ORDER — MONTELUKAST SODIUM 10 MG PO TABS
10.0000 mg | ORAL_TABLET | Freq: Every day | ORAL | Status: DC
  Administered 2020-06-18 – 2020-06-20 (×4): 10 mg via ORAL
  Filled 2020-06-17 (×6): qty 1

## 2020-06-17 NOTE — ED Triage Notes (Signed)
Pt to ED for [facility] that started a few hours ago. Denies cp. Typically wears 3L O2 at home. 88% on 3L [place]. Increased to 4L [place].  Pt in NAD, RR even and unlabored Hx copd, CHF Pt also reports feeling shaky, diabetic.

## 2020-06-17 NOTE — H&P (Signed)
[place]   PATIENT NAME: Karen [name]    MR#:  [id number]  DATE OF BIRTH:  Jun 05, 1945  DATE OF ADMISSION:  06/17/2020  PRIMARY CARE PHYSICIAN: Earlie Counts, FNP   Patient is coming from: Home  REQUESTING/REFERRING PHYSICIAN: Blake Divine, MD  CHIEF COMPLAINT:   Chief Complaint  Patient presents with  . Shortness of Breath    HISTORY OF PRESENT ILLNESS:  Karen [name] is a 75 y.o. Caucasian female with medical history significant for heart failure with preserved ejection fraction with a EF of 55 to 60% as of July 2021 with grade 1 diastolic dysfunction, hypertension, dyslipidemia, COPD, chronic respiratory failure on home O2 at 3 L/min, stage III chronic kidney disease, type 2 diabetes mellitus and asthma, who presented to the ER with acute onset of worsening dyspnea over the last couple of days with associated orthopnea and dyspnea on exertion as well as worsening lower extremity edema.  She denies any paroxysmal nocturnal dyspnea.  No chest pain or palpitations.  No fever or chills.  She denies any significant cough or wheezing.  No nausea vomiting or abdominal pain.  ED Course: Upon presentation to the ER, blood pressure was 141/65 with a pulse symmetry of 95% on 4 L of O2 by nasal cannula and otherwise normal vital signs.  Labs revealed blood glucose of 181 and sodium of 134, high-sensitivity troponin I of 11, anemia at baseline and CBC. EKG as reviewed by me : Showed normal sinus rhythm with rate of 80 with no acute findings Imaging: 2 view chest ray showed findings consistent with mild fluid overload with mild interstitial edema and small pleural effusion.  The patient was given 40 mg of IV Lasix.  She will be admitted to a progressive cardiac unit bed for further evaluation and management.  PAST MEDICAL HISTORY:   Past Medical History:  Diagnosis Date  . (HFpEF) heart failure with preserved ejection fraction ([place])    a. 2017 Echo: EF 50%; b. 06/2018 Echo:  EF 50-55%; c. 08/2018 Echo: EF 50-55%, Nl RV fxn; d. 09/2019 Echo: EF 55-60%, no rwma, mild LVH, Gr1 DD, nl RV size/fxn, PASP 63.37mHg. Mildly dil LA. Triv MR. Mod AS (AoV 0.94cm^2 VTI; mean grad 17.369mg).  . Acute on chronic respiratory failure with hypoxia and hypercapnia ([facility]11/05/2014  . Anemia   . 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 Asthma   . Cataract   . CKD (chronic kidney disease), stage III ([place]  . COPD (chronic obstructive pulmonary disease) ([place]   a. 06/2018 tobacco use, home 3L oxygen   . Diabetes mellitus without complication ([facility]   a. 09/2019 A1C 8.8  . Edema, peripheral 04/20/2014  . GI bleed 06/28/2019  . History of kidney stones   . Hyperlipidemia   . Hypertension   . Iron deficiency anemia 06/22/2014  . Junctional bradycardia    a. In setting of beta blocker therapy.  . Leucocytosis 10/19/2015  . Moderate aortic stenosis    a.  09/2019 Echo: Mod AS (AoV 0.94cm^2 VTI; mean grad 17.25m76m).  . Morbid obesity ([place] . Overactive bladder   . Primary osteoarthritis of right knee 09/01/2016  . Sciatica 01/07/2015    PAST SURGICAL HISTORY:   Past Surgical History:  Procedure Laterality Date  . APPENDECTOMY    . CESAREAN SECTION     x3  . CHOLECYSTECTOMY    . COLONOSCOPY WITH PROPOFOL N/A 08/28/2017   Procedure: COLONOSCOPY WITH PROPOFOL;  Surgeon:  Lucilla Lame, MD;  Location: [facility];  Service: Endoscopy;  Laterality: N/A;  . COLONOSCOPY WITH PROPOFOL N/A 08/29/2017   Procedure: COLONOSCOPY WITH PROPOFOL;  Surgeon: Lucilla Lame, MD;  Location: [facility] END[facility]COPY;  Service: Endoscopy;  Laterality: N/A;  . CYSTOSCOPY W/ URETERAL STENT PLACEMENT Right 09/15/2017   Procedure: CYSTOSCOPY WITH RETROGRADE PYELOGRAM/URETERAL STENT PLACEMENT;  Surgeon: Cleon Gustin, MD;  Location: [facility] ORS;  Service: Urology;  Laterality: Right;  . CYSTOSCOPY/URETEROSCOPY/HOLMIUM LASER/STENT PLACEMENT Right 10/09/2017   Procedure: CYSTOSCOPY/URETEROSCOPY/HOLMIUM LASER/STENT PLACEMENT;  Surgeon:  Abbie Sons, MD;  Location: [facility] ORS;  Service: Urology;  Laterality: Right;  right Stent exchange  . ESOPHAGOGASTRODUODENOSCOPY (EGD) WITH PROPOFOL N/A 09/16/2018   Procedure: ESOPHAGOGASTRODUODENOSCOPY (EGD) WITH PROPOFOL;  Surgeon: Lin Landsman, MD;  Location: [facility];  Service: Gastroenterology;  Laterality: N/A;  . EYE SURGERY      SOCIAL HISTORY:   Social History   Tobacco Use  . Smoking status: Former Smoker    Packs/day: 1.00    Years: 20.00    Pack years: 20.00    Quit date: 12/04/1992    Years since quitting: 27.5  . Smokeless tobacco: Never Used  Substance Use Topics  . Alcohol use: No    FAMILY HISTORY:   Family History  Problem Relation Age of Onset  . Other Mother        unknown medical history  . Other Father        unknown medical history    DRUG ALLERGIES:   Allergies  Allergen Reactions  . Ace Inhibitors Hives  . Beta Adrenergic Blockers     Junctional bradycardia  . Gabapentin Hives  . Lisinopril Hives  . Lyrica [Pregabalin] Hives  . Shrimp [Shellfish Allergy] Swelling    Swelling of the lips    REVIEW OF SYSTEMS:   ROS As per history of present illness. All pertinent systems were reviewed above. Constitutional, HEENT, cardiovascular, respiratory, GI, GU, musculoskeletal, neuro, psychiatric, endocrine, integumentary and hematologic systems were reviewed and are otherwise negative/unremarkable except for positive findings mentioned above in the HPI.   MEDICATIONS AT HOME:   Prior to Admission medications   Medication Sig Start Date End Date Taking? Authorizing Provider  acetaminophen (TYLENOL) 500 MG tablet Take 1-2 tablets (500-1,000 mg total) by mouth every 6 (six) hours as needed for mild pain or fever. Do not take more than 4 grams a day 05/25/20   Debbe Odea, MD  albuterol (VENTOLIN HFA) 108 (90 Base) MCG/ACT inhaler Inhale 2 puffs into the lungs every 6 (six) hours as needed for wheezing or shortness of breath.      [provider]  atorvastatin (LIPITOR) 10 MG tablet Take 10 mg by mouth daily.     [provider]  ELIQUIS 5 MG TABS tablet Take 1 tablet by mouth twice daily 03/11/20   [name], MD  esomeprazole (NEXIUM) 40 MG capsule Take 40 mg by mouth daily. 12/17/18   [provider]  Ferrous Sulfate (IRON) 325 (65 Fe) MG TABS Take 1 tablet by mouth daily. 09/14/17   [provider]  fluticasone (FLONASE) 50 MCG/ACT nasal spray Place 2 sprays into both nostrils daily. 11/15/18   [provider]  Fluticasone-Umeclidin-Vilant 100-62.5-25 MCG/INH AEPB Inhale 1 puff into the lungs daily. 12/25/17   [provider]  hydrochlorothiazide (HYDRODIURIL) 25 MG tablet Take 1 tablet (25 mg total) by mouth daily. 05/31/20   [name], MD  insulin glargine (LANTUS SOLOSTAR) 100 UNIT/ML Solostar Pen Inject  45 Units into the skin at bedtime. 05/25/20   Debbe Odea, MD  insulin lispro (HUMALOG KWIKPEN) 100 UNIT/ML KwikPen Inject 10 Units into the skin 3 (three) times daily. 05/25/20   Debbe Odea, MD  ipratropium (ATROVENT) 0.06 % nasal spray Place 2 sprays into both nostrils 4 (four) times daily. 05/28/20   Margarette Canada, NP  losartan (COZAAR) 100 MG tablet Take 1 tablet (100 mg total) by mouth daily. 05/25/20 05/25/21  Debbe Odea, MD  metFORMIN (GLUCOPHAGE) 500 MG tablet Take 1,000 mg by mouth 2 (two) times daily with a meal.     [provider]  montelukast (SINGULAIR) 10 MG tablet Take 10 mg by mouth at bedtime.    [provider]  OZEMPIC, 0.25 OR 0.5 MG/DOSE, 2 MG/1.5ML SOPN SMARTSIG:0.375 Milliliter(s) SUB-Q Once a Week 05/12/20   [provider]  vitamin B-12 (CYANOCOBALAMIN) 500 MCG tablet Take 500 mcg by mouth daily.    [provider]      VITAL SIGNS:  Blood pressure (!) 147/88, pulse 92, temperature 98.4 F (36.9 C), temperature source Oral, resp. rate (!) 22, height '5\' 3"'$  (1.6 m), weight 95.3 kg, SpO2 98  %.  PHYSICAL EXAMINATION:  Physical Exam  GENERAL:  75 y.o.-year-old Caucasian patient lying in the bed with mild respiratory distress with conversational dyspnea. EYES: Pupils equal, round, reactive to light and accommodation. No scleral icterus. Extraocular muscles intact.  HEENT: Head atraumatic, normocephalic. Oropharynx and nasopharynx clear.  NECK:  Supple, no jugular venous distention. No thyroid enlargement, no tenderness.  LUNGS: Diminished bibasal breath sounds with bibasilar rales. CARDIOVASCULAR: Regular rate and rhythm, S1, S2 normal. No murmurs, rubs, or gallops.  ABDOMEN: Soft, nondistended, nontender. Bowel sounds present. No organomegaly or mass.  EXTREMITIES: Bilateral lower extremity pitting edema with no cyanosis, or clubbing.  NEUROLOGIC: Cranial nerves II through XII are intact. Muscle strength 5/5 in all extremities. Sensation intact. Gait not checked.  PSYCHIATRIC: The patient is alert and oriented x 3.  Normal affect and good eye contact. SKIN: No obvious rash, lesion, or ulcer.   LABORATORY PANEL:   CBC Recent Labs  Lab 06/17/20 1808  WBC 10.3  HGB 10.0*  HCT 30.8*  PLT 253   ------------------------------------------------------------------------------------------------------------------  Chemistries  Recent Labs  Lab 06/17/20 1808  NA 134*  K 4.6  CL 99  CO2 27  GLUCOSE 181*  BUN 18  CREATININE 0.86  CALCIUM 9.1   ------------------------------------------------------------------------------------------------------------------  Cardiac Enzymes No results for input(s): TROPONINI in the last 168 hours. ------------------------------------------------------------------------------------------------------------------  RADIOLOGY:  DG Chest 2 View  Result Date: 06/17/2020 CLINICAL DATA:  Short of breath EXAM: CHEST - 2 VIEW COMPARISON:  05/21/2020 FINDINGS: Mild cardiac enlargement. Progressive vascular congestion. Small pleural effusion on  the lateral view. Possible mild interstitial edema has developed. IMPRESSION: Findings consistent with mild fluid overload with mild interstitial edema and small pleural effusion. Electronically Signed   By: Franchot Gallo M.D.   On: 06/17/2020 19:23      IMPRESSION AND PLAN:  Active Problems:   Acute CHF (congestive heart failure) ([place])  1.  Acute on chronic diastolic CHF with subsequent acute on chronic hypoxic respiratory failure.. - The patient had a 2D echo in July of last year as above revealing an EF of 55 to 60% with grade 1 diastolic dysfunction and therefore I will not repeat it at this time. - The patient will be admitted to a progressive unit bed. - We will continue diuresis with IV Lasix. - O2 protocol  will be followed. - We will follow serial troponin I's. - Cardiology consult will be obtained for follow-up. - I notified Dr. Lovena Le about the patient.  2.  Essential hypertension. - We will continue her Cozaar and HCTZ.  3.  Dyslipidemia. - We will continue statin therapy.  4.  GERD. - We will continue PPI therapy.  5.  Paroxysmal atrial fibrillation. - We will continue Eliquis.  6.  Type 2 diabetes mellitus. - We will place her on supplemental coverage with NovoLog and continue her basal coverage while holding off metformin.  DVT prophylaxis: Continue Eliquis Code Status: full code. Family Communication:  The plan of care was discussed in details with the patient (who requested no other family members to be notified at this time.). I answered all questions. The patient agreed to proceed with the above mentioned plan. Further management will depend upon hospital course. Disposition Plan: Back to previous home environment Consults called: Cardiology All the records are reviewed and case discussed with ED provider.  Status is: Inpatient  Remains inpatient appropriate because:Ongoing diagnostic testing needed not appropriate for outpatient work up, Unsafe d/c plan,  IV treatments appropriate due to intensity of illness or inability to take PO and Inpatient level of care appropriate due to severity of illness   Dispo: The patient is from: Home              Anticipated d/c is to: Home              Patient currently is not medically stable to d/c.   Difficult to place patient No   TOTAL TIME TAKING CARE OF THIS PATIENT: 55 minutes.    Christel Mormon M.D on 06/17/2020 at 10:15 PM  [facility]   From 7 PM-7 AM, contact night-coverage [email or web address]  CC: Primary care physician; D, Ples Specter, FNP

## 2020-06-17 NOTE — ED Provider Notes (Signed)
[facility] Emergency Department Provider Note   ____________________________________________   Event Date/Time   First MD Initiated Contact with Patient 06/17/20 2041     (approximate)  I have reviewed the triage vital signs and the nursing notes.   HISTORY  Chief Complaint Shortness of Breath    HPI Karen [name] is a 75 y.o. female with past medical history of diabetes, CKD, COPD on 3 L, diastolic CHF, and atrial fibrillation on Eliquis who presents the ED complaining of shortness of breath.  Patient reports increasing difficulty breathing over the course of the day today.  She has not had any fevers, cough, or pain in her chest.  Difficulty breathing is typically worse with exertion and she also endorses swelling in her legs.  She has not had any pain in her legs and denies any history of DVT/PE.  She has been using her inhaler at home without significant relief.        Past Medical History:  Diagnosis Date  . (HFpEF) heart failure with preserved ejection fraction ([place])    a. 2017 Echo: EF 50%; b. 06/2018 Echo: EF 50-55%; c. 08/2018 Echo: EF 50-55%, Nl RV fxn; d. 09/2019 Echo: EF 55-60%, no rwma, mild LVH, Gr1 DD, nl RV size/fxn, PASP 63.104mHg. Mildly dil LA. Triv MR. Mod AS (AoV 0.94cm^2 VTI; mean grad 17.372mg).  . Acute on chronic respiratory failure with hypoxia and hypercapnia (HCAlleghany11/05/2014  . Anemia   . 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 Asthma   . Cataract   . CKD (chronic kidney disease), stage III (HCNew Lebanon  . COPD (chronic obstructive pulmonary disease) (HCHuntington [place]   a. 06/2018 tobacco use, home 3L oxygen   . Diabetes mellitus without complication (HCBenitez   a. 09/2019 A1C 8.8  . Edema, peripheral 04/20/2014  . GI bleed 06/28/2019  . History of kidney stones   . Hyperlipidemia   . Hypertension   . Iron deficiency anemia 06/22/2014  . Junctional bradycardia    a. In setting of beta blocker therapy.  . Leucocytosis 10/19/2015  . Moderate aortic stenosis     a.  09/2019 Echo: Mod AS (AoV 0.94cm^2 VTI; mean grad 17.1m18m).  . Morbid obesity (HCCSkidmore . Overactive bladder   . Primary osteoarthritis of right knee 09/01/2016  . Sciatica 01/07/2015    Patient Active Problem List   Diagnosis Date Noted  . Acute CHF (congestive heart failure) (HCCTolland4/14/2022  . Chronic respiratory failure with hypercapnia (HCCEnhaut3/18/2022  . Severe sepsis (HCCCraig3/18/2022  . UTI (urinary tract infection) 05/21/2020  . Hyperglycemia due to type 2 diabetes mellitus (HCCAlligator3/18/2022  . Abnormal CT of liver 05/21/2020  . 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 05/21/2020  . Morbid obesity (HCCWestville3/07/2020  . Acute hip pain, left 10/23/2019  . Lumbar stenosis with neurogenic claudication 10/23/2019  . COPD with acute exacerbation (HCCRidgeland7/25/2021  . Acute diastolic CHF (congestive heart failure) (HCCLa Barge7/25/2021  . (HFpEF) heart failure with preserved ejection fraction (HCCNewfield7/24/2021  . Rectal bleeding 06/28/2019  . Hypokalemia 06/28/2019  . Hyponatremia 06/28/2019  . Type II diabetes mellitus with renal manifestations (HCCCotton City4/24/2021  . CKD (chronic kidney disease), stage IIIa 06/28/2019  . Atrial fibrillation, chronic (HCCRockwell4/24/2021  . Pulmonary edema 02/27/2019  . Bradycardia 02/24/2019  . Chronic respiratory failure with hypoxia (HCCGarden2/20/2020  . Atypical chest pain 02/13/2019  . Osteopenia of neck of left femur 11/20/2018  . AVM (arteriovenous malformation) of  small bowel, acquired   . Acute gastric ulcer with hemorrhage   . Chronic diastolic heart failure ([place]) 08/22/2018  . Diarrhea 08/22/2018  . Junctional bradycardia   . Acute on chronic heart failure with preserved ejection fraction (HFpEF) ([place])   . AKI (acute kidney injury) ([place])   . Symptomatic bradycardia 08/05/2018  . Acute on chronic respiratory failure ([place]) 06/25/2018  . 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 diabetes mellitus ([place]) 01/25/2018  . History of non  anemic vitamin B12 deficiency 01/25/2018  . Personal history of kidney stones 11/12/2017  . Urge incontinence 11/12/2017  . History of leukocytosis 09/17/2017  . Right ureteral stone 09/15/2017  . Acute GI bleeding   . GI bleed 08/26/2017  . Arthritis 08/10/2017  . Stage 4 chronic kidney disease ([place]) 08/10/2017  . COPD (chronic obstructive pulmonary disease) ([place]) 08/10/2017  . Diabetes mellitus type 2, uncomplicated ([place]) 0000000  . Hypertension 08/10/2017  . Obesity (BMI 35.0-39.9 without comorbidity) 04/11/2017  . Primary osteoarthritis of right knee 09/01/2016  . Leucocytosis 10/19/2015  . 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 Acute on chronic respiratory failure with hypoxia and hypercapnia ([place]) 01/07/2015  . Sciatica 01/07/2015  . Generalized weakness 01/07/2015  . 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 Iron deficiency anemia 06/22/2014  . Microalbuminuria 06/22/2014  . CHF (congestive heart failure) ([place]) 04/20/2014  . Edema, peripheral 04/20/2014    Past Surgical History:  Procedure Laterality Date  . APPENDECTOMY    . CESAREAN SECTION     x3  . CHOLECYSTECTOMY    . COLONOSCOPY WITH PROPOFOL N/A 08/28/2017   Procedure: COLONOSCOPY WITH PROPOFOL;  Surgeon: Lucilla Lame, MD;  Location: [facility] ENDOSCOPY;  Service: Endoscopy;  Laterality: N/A;  . COLONOSCOPY WITH PROPOFOL N/A 08/29/2017   Procedure: COLONOSCOPY WITH PROPOFOL;  Surgeon: Lucilla Lame, MD;  Location: [facility] ENDOSCOPY;  Service: Endoscopy;  Laterality: N/A;  . CYSTOSCOPY W/ URETERAL STENT PLACEMENT Right 09/15/2017   Procedure: CYSTOSCOPY WITH RETROGRADE PYELOGRAM/URETERAL STENT PLACEMENT;  Surgeon: Cleon Gustin, MD;  Location: [facility] ORS;  Service: Urology;  Laterality: Right;  . CYSTOSCOPY/URETEROSCOPY/HOLMIUM LASER/STENT PLACEMENT Right 10/09/2017   Procedure: CYSTOSCOPY/URETEROSCOPY/HOLMIUM LASER/STENT PLACEMENT;  Surgeon: Abbie Sons, MD;  Location: [facility] ORS;  Service: Urology;   Laterality: Right;  right Stent exchange  . ESOPHAGOGASTRODUODENOSCOPY (EGD) WITH PROPOFOL N/A 09/16/2018   Procedure: ESOPHAGOGASTRODUODENOSCOPY (EGD) WITH PROPOFOL;  Surgeon: Lin Landsman, MD;  Location: [place];  Service: Gastroenterology;  Laterality: N/A;  . EYE SURGERY      Prior to Admission medications   Medication Sig Start Date End Date Taking? Authorizing Provider  acetaminophen (TYLENOL) 500 MG tablet Take 1-2 tablets (500-1,000 mg total) by mouth every 6 (six) hours as needed for mild pain or fever. Do not take more than 4 grams a day 05/25/20   Debbe Odea, MD  albuterol (VENTOLIN HFA) 108 (90 Base) MCG/ACT inhaler Inhale 2 puffs into the lungs every 6 (six) hours as needed for wheezing or shortness of breath.     [provider]  atorvastatin (LIPITOR) 10 MG tablet Take 10 mg by mouth daily.     [provider]  ELIQUIS 5 MG TABS tablet Take 1 tablet by mouth twice daily 03/11/20   [name], MD  esomeprazole (NEXIUM) 40 MG capsule Take 40 mg by mouth daily. 12/17/18   [provider]  Ferrous Sulfate (IRON) 325 (65 Fe) MG TABS Take 1 tablet by mouth daily. 09/14/17   [provider]  fluticasone (FLONASE) 50 MCG/ACT nasal spray Place 2 sprays into both nostrils daily. 11/15/18   [provider]  Fluticasone-Umeclidin-Vilant 100-62.5-25 MCG/INH AEPB Inhale 1 puff into the lungs daily. 12/25/17   [provider]  hydrochlorothiazide (HYDRODIURIL) 25 MG tablet Take 1 tablet (25 mg total) by mouth daily. 05/31/20   [name], MD  insulin glargine (LANTUS SOLOSTAR) 100 UNIT/ML Solostar Pen Inject 45 Units into the skin at bedtime. 05/25/20   Debbe Odea, MD  insulin lispro (HUMALOG KWIKPEN) 100 UNIT/ML KwikPen Inject 10 Units into the skin 3 (three) times daily. 05/25/20   Debbe Odea, MD  ipratropium (ATROVENT) 0.06 % nasal spray Place 2 sprays into both nostrils 4 (four) times daily. 05/28/20   Margarette Canada, NP  losartan (COZAAR) 100 MG tablet Take 1 tablet (100 mg total) by mouth daily. 05/25/20 05/25/21  Debbe Odea, MD  metFORMIN (GLUCOPHAGE) 500 MG tablet Take 1,000 mg by mouth 2 (two) times daily with a meal.     [provider]  montelukast (SINGULAIR) 10 MG tablet Take 10 mg by mouth at bedtime.    [provider]  OZEMPIC, 0.25 OR 0.5 MG/DOSE, 2 MG/1.5ML SOPN SMARTSIG:0.375 Milliliter(s) SUB-Q Once a Week 05/12/20   [provider]  vitamin B-12 (CYANOCOBALAMIN) 500 MCG tablet Take 500 mcg by mouth daily.    [provider]    Allergies Ace inhibitors, Beta adrenergic blockers, Gabapentin, Lisinopril, Lyrica [pregabalin], and Shrimp [shellfish allergy]  Family History  Problem Relation Age of Onset  . Other Mother        unknown medical history  . Other Father        unknown medical history    Social History Social History   Tobacco Use  . Smoking status: Former Smoker    Packs/day: 1.00    Years: 20.00    Pack years: 20.00    Quit date: 12/04/1992    Years since quitting: 27.5  . Smokeless tobacco: Never Used  Vaping Use  . Vaping Use: Never used  Substance Use Topics  . Alcohol use: No  . Drug use: No    Review of Systems  Constitutional: No fever/chills Eyes: No visual changes. ENT: No sore throat. Cardiovascular: Denies chest pain. Respiratory: Positive for shortness of breath. Gastrointestinal: No abdominal pain.  No nausea, no vomiting.  No diarrhea.  No constipation. Genitourinary: Negative for dysuria. Musculoskeletal: Negative for back pain.  Positive for leg swelling. Skin: Negative for rash. Neurological: Negative for headaches, focal weakness or numbness.  ____________________________________________   PHYSICAL EXAM:  VITAL SIGNS: ED Triage Vitals  Enc Vitals Group     BP 06/17/20 1801 (!) 141/65     Pulse Rate 06/17/20 1801 80     Resp 06/17/20 1801 20     Temp 06/17/20 1801 98.4 F (36.9 C)      Temp Source 06/17/20 1801 Oral     SpO2 06/17/20 1801 95 %     Weight 06/17/20 1802 210 lb (95.3 kg)     Height 06/17/20 1802 '5\' 3"'$  (1.6 m)     Head Circumference --      Peak Flow --      Pain Score 06/17/20 1802 0     Pain Loc --      Pain Edu? --      Excl. in [place]? --     Constitutional: Alert and oriented. Eyes: Conjunctivae are normal. Head: Atraumatic. Nose: No congestion/rhinnorhea. Mouth/Throat: Mucous membranes are moist. Neck: Normal  ROM Cardiovascular: Normal rate, regular rhythm. Grossly normal heart sounds. Respiratory: Mildly tachypneic with increased respiratory effort.  No retractions. Lungs with crackles to bilateral bases. Gastrointestinal: Soft and nontender. No distention. Genitourinary: deferred Musculoskeletal: No lower extremity tenderness, 2+ pitting edema to knees bilaterally. Neurologic:  Normal speech and language. No gross focal neurologic deficits are appreciated. Skin:  Skin is warm, dry and intact. No rash noted. Psychiatric: Mood and affect are normal. Speech and behavior are normal.  ____________________________________________   LABS (all labs ordered are listed, but only abnormal results are displayed)  Labs Reviewed  BASIC METABOLIC PANEL - Abnormal; Notable for the following components:      Result Value   Sodium 134 (*)    Glucose, Bld 181 (*)    All other components within normal limits  CBC - Abnormal; Notable for the following components:   RBC 3.44 (*)    Hemoglobin 10.0 (*)    HCT 30.8 (*)    All other components within normal limits  CBG MONITORING, ED - Abnormal; Notable for the following components:   Glucose-Capillary 172 (*)    All other components within normal limits  SARS CORONAVIRUS 2 (TAT 6-24 HRS)  BASIC METABOLIC PANEL  CBC  TROPONIN I (HIGH SENSITIVITY)  TROPONIN I (HIGH SENSITIVITY)   ____________________________________________  EKG  ED ECG REPORT I, Blake Divine, the attending physician, personally  viewed and interpreted this ECG.   Date: 06/18/2020  EKG Time: 18:06  Rate: 80  Rhythm: normal sinus rhythm  Axis: Normal  Intervals:none  ST&T Change: None   PROCEDURES  Procedure(s) performed (including Critical Care):  Procedures   ____________________________________________   INITIAL IMPRESSION / ASSESSMENT AND PLAN / ED COURSE       75 year old female with past medical history of COPD on 3 L, diastolic CHF, diabetes, CKD, and atrial fibrillation on Eliquis who presents to the ED with increasing difficulty breathing over the course of today.  She appears fluid overloaded and chest x-ray reviewed by me with pulmonary edema.  EKG shows no evidence of arrhythmia or ischemia and troponin is negative, low suspicion for ACS or PE.  No wheezing to suggest COPD exacerbation.  Given apparent CHF exacerbation, we will diurese with IV Lasix.  Patient became significantly short of breath with ambulation and dropped O2 sats into the 70s, case discussed with hospitalist for admission.      ____________________________________________   FINAL CLINICAL IMPRESSION(S) / ED DIAGNOSES  Final diagnoses:  Acute on chronic congestive heart failure, unspecified heart failure type ([place])  Shortness of breath     ED Discharge Orders    None       Note:  This document was prepared using Dragon voice recognition software and may include unintentional dictation errors.   Blake Divine, MD 06/18/20 0100

## 2020-06-17 NOTE — ED Notes (Signed)
Pt called out using call bell requesting assistance to walk to restroom. Pt ambulatory with assistance from this RN.

## 2020-06-18 ENCOUNTER — Encounter: Payer: Self-pay | Admitting: Family Medicine

## 2020-06-18 DIAGNOSIS — R531 Weakness: Secondary | ICD-10-CM

## 2020-06-18 DIAGNOSIS — K219 Gastro-esophageal reflux disease without esophagitis: Secondary | ICD-10-CM

## 2020-06-18 DIAGNOSIS — E669 Obesity, unspecified: Secondary | ICD-10-CM

## 2020-06-18 DIAGNOSIS — I509 Heart failure, unspecified: Secondary | ICD-10-CM

## 2020-06-18 DIAGNOSIS — I350 Nonrheumatic aortic (valve) stenosis: Secondary | ICD-10-CM

## 2020-06-18 DIAGNOSIS — E1169 Type 2 diabetes mellitus with other specified complication: Secondary | ICD-10-CM

## 2020-06-18 DIAGNOSIS — I480 Paroxysmal atrial fibrillation: Secondary | ICD-10-CM | POA: Insufficient documentation

## 2020-06-18 LAB — BASIC METABOLIC PANEL
Anion gap: 11 (ref 5–15)
BUN: 16 mg/dL (ref 8–23)
CO2: 31 mmol/L (ref 22–32)
Calcium: 9.1 mg/dL (ref 8.9–10.3)
Chloride: 96 mmol/L — ABNORMAL LOW (ref 98–111)
Creatinine, Ser: 0.76 mg/dL (ref 0.44–1.00)
GFR, Estimated: >60 mL/min (ref >60)
Glucose, Bld: 93 mg/dL (ref 70–99)
Potassium: 3.9 mmol/L (ref 3.5–5.1)
Sodium: 138 mmol/L (ref 135–145)

## 2020-06-18 LAB — CBC
HCT: 32.4 % — ABNORMAL LOW (ref 36.0–46.0)
Hemoglobin: 10.4 g/dL — ABNORMAL LOW (ref 12.0–15.0)
MCH: 28.7 pg (ref 26.0–34.0)
MCHC: 32.1 g/dL (ref 30.0–36.0)
MCV: 89.3 fL (ref 80.0–100.0)
Platelets: 248 10*3/uL (ref 150–400)
RBC: 3.63 MIL/uL — ABNORMAL LOW (ref 3.87–5.11)
RDW: 14.5 % (ref 11.5–15.5)
WBC: 10 10*3/uL (ref 4.0–10.5)
nRBC: 0 % (ref 0.0–0.2)

## 2020-06-18 LAB — SARS CORONAVIRUS 2 (TAT 6-24 HRS): SARS Coronavirus 2: NEGATIVE

## 2020-06-18 LAB — GLUCOSE, CAPILLARY: Glucose-Capillary: 254 mg/dL — ABNORMAL HIGH (ref 70–99)

## 2020-06-18 LAB — TSH: TSH: 2.339 u[IU]/mL (ref 0.350–4.500)

## 2020-06-18 LAB — CBG MONITORING, ED: Glucose-Capillary: 140 mg/dL — ABNORMAL HIGH (ref 70–99)

## 2020-06-18 MED ORDER — INSULIN ASPART 100 UNIT/ML ~~LOC~~ SOLN
0.0000 [IU] | Freq: Every day | SUBCUTANEOUS | Status: DC
  Administered 2020-06-18: 3 [IU] via SUBCUTANEOUS
  Administered 2020-06-20: 2 [IU] via SUBCUTANEOUS
  Filled 2020-06-18 (×3): qty 1

## 2020-06-18 MED ORDER — FLUTICASONE FUROATE-VILANTEROL 100-25 MCG/INH IN AEPB
1.0000 | INHALATION_SPRAY | Freq: Every day | RESPIRATORY_TRACT | Status: DC
  Administered 2020-06-18 – 2020-06-21 (×4): 1 via RESPIRATORY_TRACT
  Filled 2020-06-18 (×2): qty 28

## 2020-06-18 MED ORDER — FLUTICASONE-UMECLIDIN-VILANT 100-62.5-25 MCG/INH IN AEPB: 1.0000 | INHALATION_SPRAY | Freq: Every day | RESPIRATORY_TRACT | Status: DC

## 2020-06-18 MED ORDER — ALBUTEROL SULFATE (2.5 MG/3ML) 0.083% IN NEBU
2.5000 mg | INHALATION_SOLUTION | Freq: Four times a day (QID) | RESPIRATORY_TRACT | Status: DC
  Administered 2020-06-18 – 2020-06-21 (×12): 2.5 mg via RESPIRATORY_TRACT
  Filled 2020-06-18 (×12): qty 3

## 2020-06-18 MED ORDER — INSULIN ASPART 100 UNIT/ML ~~LOC~~ SOLN
0.0000 [IU] | Freq: Three times a day (TID) | SUBCUTANEOUS | Status: DC
  Administered 2020-06-19: 2 [IU] via SUBCUTANEOUS
  Administered 2020-06-19: 5 [IU] via SUBCUTANEOUS
  Administered 2020-06-19 – 2020-06-20 (×3): 3 [IU] via SUBCUTANEOUS
  Administered 2020-06-20: 2 [IU] via SUBCUTANEOUS
  Administered 2020-06-21: 3 [IU] via SUBCUTANEOUS
  Administered 2020-06-21: 2 [IU] via SUBCUTANEOUS
  Filled 2020-06-18 (×8): qty 1

## 2020-06-18 MED ORDER — UMECLIDINIUM BROMIDE 62.5 MCG/INH IN AEPB
1.0000 | INHALATION_SPRAY | Freq: Every day | RESPIRATORY_TRACT | Status: DC
  Administered 2020-06-18 – 2020-06-21 (×4): 1 via RESPIRATORY_TRACT
  Filled 2020-06-18 (×2): qty 7

## 2020-06-18 MED ORDER — FUROSEMIDE 10 MG/ML IJ SOLN
40.0000 mg | Freq: Two times a day (BID) | INTRAMUSCULAR | Status: DC
  Administered 2020-06-18 – 2020-06-20 (×4): 40 mg via INTRAVENOUS
  Filled 2020-06-18 (×4): qty 4

## 2020-06-18 MED ORDER — INSULIN ASPART 100 UNIT/ML ~~LOC~~ SOLN
5.0000 [IU] | Freq: Three times a day (TID) | SUBCUTANEOUS | Status: DC
  Administered 2020-06-19 – 2020-06-21 (×8): 5 [IU] via SUBCUTANEOUS
  Filled 2020-06-18 (×8): qty 1

## 2020-06-18 NOTE — Progress Notes (Signed)
Patient ID: Karen [name], female   DOB: Jul 19, 1945, 75 y.o.   MRN: [id number] [facility] PROGRESS NOTE  MARYAH FROHN [id number] DOB: 10/08/1945 DOA: 06/17/2020 PCP: Earlie Counts, FNP  HPI/Subjective: Patient not feeling well.  Coming in with shortness of breath going on for 3 days.  Chronically wears 3 L of oxygen.  Admitted with CHF exacerbation.  Objective: Vitals:   06/18/20 0700 06/18/20 1000  BP: (!) 166/75 (!) 118/59  Pulse: (!) 101 82  Resp: 19 19  Temp:    SpO2: 100% 96%    Intake/Output Summary (Last 24 hours) at 06/18/2020 1244 Last data filed at 06/18/2020 1238 Gross per 24 hour  Intake --  Output 801 ml  Net -801 ml   Filed Weights   06/17/20 1802  Weight: 95.3 kg    ROS: Review of Systems  Respiratory: Positive for shortness of breath. Negative for cough and wheezing.   Cardiovascular: Negative for chest pain.  Gastrointestinal: Negative for abdominal pain, nausea and vomiting.   Exam: Physical Exam HENT:     Head: Normocephalic.     Mouth/Throat:     Pharynx: No oropharyngeal exudate.  Eyes:     General: Lids are normal.     Conjunctiva/sclera: Conjunctivae normal.     Pupils: Pupils are equal, round, and reactive to light.  Cardiovascular:     Rate and Rhythm: Normal rate and regular rhythm.     Heart sounds: S1 normal and S2 normal. Murmur heard.   Systolic murmur is present with a grade of 2/6.   Pulmonary:     Breath sounds: Examination of the right-lower field reveals decreased breath sounds and rales. Examination of the left-lower field reveals decreased breath sounds and rales. Decreased breath sounds and rales present. No wheezing or rhonchi.  Abdominal:     Palpations: Abdomen is soft.     Tenderness: There is no abdominal tenderness.  Musculoskeletal:     Right lower leg: No swelling.     Left lower leg: No swelling.  Skin:    General: Skin is warm.     Comments: Chronic lower extremity skin discoloration   Neurological:     Mental Status: She is alert and oriented to person, place, and time.       Data Reviewed: Basic Metabolic Panel: Recent Labs  Lab 06/17/20 1808 06/18/20 0653  NA 134* 138  K 4.6 3.9  CL 99 96*  CO2 27 31  GLUCOSE 181* 93  BUN 18 16  CREATININE 0.86 0.76  CALCIUM 9.1 9.1   CBC: Recent Labs  Lab 06/17/20 1808 06/18/20 0653  WBC 10.3 10.0  HGB 10.0* 10.4*  HCT 30.8* 32.4*  MCV 89.5 89.3  PLT 253 248   BNP (last 3 results) Recent Labs    09/27/19 1325 12/05/19 1638 05/21/20 2034  BNP 88.5 84.6 25.9    CBG: Recent Labs  Lab 06/17/20 1811 06/18/20 0039  GLUCAP 172* 140*    Recent Results (from the past 240 hour(s))  SARS CORONAVIRUS 2 (TAT 6-24 HRS) Nasopharyngeal Nasopharyngeal Swab     Status: None   Collection Time: 06/17/20 10:09 PM   Specimen: Nasopharyngeal Swab  Result Value Ref Range Status   SARS Coronavirus 2 NEGATIVE NEGATIVE Final    Comment: (NOTE) SARS-CoV-2 target nucleic acids are NOT DETECTED.  The SARS-CoV-2 RNA is generally detectable in upper and lower respiratory specimens during the acute phase of infection. Negative results do not preclude SARS-CoV-2 infection, do not rule  out co-infections with other pathogens, and should not be used as the sole basis for treatment or other patient management decisions. Negative results must be combined with clinical observations, patient history, and epidemiological information. The expected result is Negative.  Fact Sheet for Patients: SugarRoll.be  Fact Sheet for Healthcare Providers: https://www.woods-mathews.com/  This test is not yet approved or cleared by the [place] FDA and  has been authorized for detection and/or diagnosis of SARS-CoV-2 by FDA under an Emergency Use Authorization (EUA). This EUA will remain  in effect (meaning this test can be used) for the duration of the COVID-19 declaration under Se ction  564(b)(1) of the Act, 21 U.S.C. section 360bbb-3(b)(1), unless the authorization is terminated or revoked sooner.  Performed at [facility], [place] [address] [place], [place] [address]      Studies: DG Chest 2 View  Result Date: 06/17/2020 CLINICAL DATA:  Short of breath EXAM: CHEST - 2 VIEW COMPARISON:  05/21/2020 FINDINGS: Mild cardiac enlargement. Progressive vascular congestion. Small pleural effusion on the lateral view. Possible mild interstitial edema has developed. IMPRESSION: Findings consistent with mild fluid overload with mild interstitial edema and small pleural effusion. Electronically Signed   By: Franchot Gallo M.D.   On: 06/17/2020 19:23    Scheduled Meds: . apixaban  5 mg Oral BID  . atorvastatin  10 mg Oral Daily  . ferrous sulfate  325 mg Oral Daily  . fluticasone  2 spray Each Nare Daily  . furosemide  40 mg Intravenous Q12H  . insulin aspart  0-5 Units Subcutaneous QHS  . insulin aspart  0-9 Units Subcutaneous TID WC  . insulin aspart  5 Units Subcutaneous TID WC  . insulin glargine  45 Units Subcutaneous QHS  . ipratropium  2 spray Each Nare QID  . losartan  100 mg Oral Daily  . montelukast  10 mg Oral QHS  . pantoprazole  40 mg Oral Daily  . vitamin B-12  500 mcg Oral Daily   Brief history.  Patient admitted 06/17/2020 in the evening with acute on chronic diastolic congestive heart failure.  Patient has chronic hypoxic respiratory failure on 3 L.  Other history includes essential hypertension, hyperlipidemia, paroxysmal atrial fibrillation.  Patient was started on IV Lasix.  Assessment/Plan:  1. Acute diastolic congestive heart failure, moderate aortic stenosis on last echocardiogram.  Patient is on IV Lasix 40 mg IV twice daily.  BNP actually on the lower side.  Not quite sure what to make of this.  Patient not on beta-blocker secondary to junctional bradycardia when taking this class of medications. 2. Acute on chronic hypoxic respiratory failure.   The patient did have a pulse ox of 85% early this morning.  Unclear if that was on oxygen or off oxygen.  Patient was on 4 L of oxygen when I saw her and dialed her down to her normal 3 L.  I added her inhaler and nebulizer treatments in case COPD playing a role with her trouble breathing.  Holding off on steroids at this point.  Covid negative. 3. Type 2 diabetes mellitus with hyperlipidemia on glargine insulin and sliding scale insulin.  Also on atorvastatin.  Hemoglobin last month up at 11.6. 4. Paroxysmal atrial fibrillation on Eliquis for anticoagulation.  Not on medications for rate control. 5. Weakness.  Physical therapy evaluation 6. Obesity with a BMI of 37.20 7. GERD on Protonix 8. CT scan showing enlargement of the left lobe of the liver.  Will check hepatitis profiles.  No ascites seen on CT scan        Code Status:     Code Status Orders  (From admission, onward)         Start     Ordered   06/17/20 2212  Full code  Continuous        06/17/20 2214        Code Status History    Date Active Date Inactive Code Status Order ID Comments User Context   05/21/2020 2251 05/25/2020 1950 Full Code [id number]  Athena Masse, MD Inpatient   09/28/2019 0120 10/02/2019 2116 DNR [id number]  Sueanne Margarita, [facility] ED   06/28/2019 1333 06/30/2019 1619 DNR [id number]  Ivor Costa, MD Inpatient   06/28/2019 1255 06/28/2019 1333 DNR [id number]  Ivor Costa, MD Inpatient   02/23/2019 1938 02/27/2019 1810 Full Code [id number]  Edwin Dada, MD Inpatient   02/13/2019 2011 02/16/2019 1819 DNR [id number]  Nicolette Bang, DO ED   09/14/2018 2220 09/17/2018 2350 DNR [id number]  Henreitta Leber, MD Inpatient   08/05/2018 1547 08/10/2018 1949 Full Code [id number]  Gladstone Lighter, MD Inpatient   06/25/2018 1417 06/30/2018 1557 Full Code [id number]  Epifanio Lesches, MD ED   09/15/2017 1747 09/18/2017 2106 DNR [id number]  Hillary Bow, MD ED   08/26/2017 2216 08/30/2017 1604 Full Code [id number]   Nicholes Mango, MD Inpatient   01/08/2015 0307 01/09/2015 1336 Full Code [id number]  Theodoro Grist, MD Inpatient   Advance Care Planning Activity     Family Communication: Spoke with husband on phone Disposition Plan: Status is: Inpatient  Dispo: The patient is from: Home              Anticipated d/c is to: Home              Patient currently being diuresed with IV Lasix not ready for discharge yet   Difficult to place patient.  Hopefully not  Consultants:  Cardiology  Time spent: 29 minutes  [place]

## 2020-06-18 NOTE — Consult Note (Signed)
   Heart Failure Nurse Navigator Note  HFpEF 55 to 60%.  Mild LVH.  Normal right ventricular systolic function.  Mild left atrial enlargement.  Moderate aortic stenosis.  She presented to the emergency room with complaints of increasing shortness of breath, orthopnea, dyspnea on exertion and lower extremity edema.   Comorbidities:  Chronic kidney disease stage III Diabetes type 2 Asthma Hypertension Hyperlipidemia Morbid obesity Osteoarthritis Paroxysmal atrial fibrillation  Labs:  Sodium 138, potassium 3.9, chloride 96, CO2 31, BUN 16, creatinine 0.76, hemoglobin 10.4 hematocrit 32  Medications:   Atorvastatin 10 mg daily Lasix 40 mg IV twice a day Losartan 100 mg daily   Assessment:  General-she is awake and alert lying in gurney in the emergency room currently on 3 L of O2 per nasal cannula does not appear in any acute distress but states that she just does not feel well.  H EENT-pupils are equal, normocephalic  Cardiac-heart tones of regular rate and rhythm no murmur rubs or gallops appreciated  Chest-breath sounds are diminished in the bases.  No wheezes or rhonchi noted.  Abdomen-rounded soft non tender  Lower extremities-there is no edema noted.  Psych-is pleasant and appropriate makes eye contact.  Neurologic-speech is clear.   Initial meeting with patient today in the emergency room she states that she just does not feel well.  She states at home that she weighs herself every morning and she had not noted a weight gain.  When questioned about the foods that she eats, she states she does not eat canned soups, vegetables or convenience foods.  She does eat fruits and vegetables.  She is followed by para medicine as an outpatient.  Discussed the zone magnet, along with living with heart failure teaching booklet.  Was also given information about the heart failure clinic and an appointment time.  Pricilla Riffle RN [facility]FN

## 2020-06-18 NOTE — Consult Note (Signed)
Cardiology Consultation:   Patient ID: Karen [name] MRN: [id number]; DOB: 05-Oct-1945  Admit date: 06/17/2020 Date of Consult: 06/18/2020  PCP:  Earlie Counts, [place]  Cardiologist:  Ida Rogue, MD  Advanced Practice Provider:  No care team member to display Electrophysiologist:  None   Patient Profile:   Karen [name] is a 75 y.o. female with a hx of HFpEF, severe pulmonary hypertension with RVSP 63.65mHg, moderate aortic stenosis, junctional bradycardia with BB/CCB, PAF by 03/2019 monitor, CKD 3, GI bleed, DM2, hypertension, hyperlipidemia, chronic hypoxic respiratory failure secondary to COPD on home oxygen, anemia, obesity, and who is being seen today for the evaluation of acute on chronic HFpEF at the request of Dr. MSidney Ace  History of Present Illness:   Ms. KOdenwaldis a 75year old female with PMH as above.  Previous work-up includes 09/2019 echo EF nl with moderate aortic stenosis and severe pulmonary hypertension. Aortic valve area, by VTI measures 0.9 cm.  Aortic valve mean gradient 17.3 mmHg. RVSP 63.6 mmHg.  She underwent cardiac monitoring 03/2019, which showed PAF and was started on anticoagulation.  [facility] and admitted with severe sepsis due to UTI, dehydration, hypotension, AKI, hyperglycemia, and hyponatremia.   She was last  seen in clinic 05/31/20 (wt 214 pounds) with HCTZ restarted for hypertension.  Phone notes after that time are from [facility] EMT regarding palliative care consult.  Over the last couple of days, she reports intermittent episodes of chest tightness, shortness of breath, and racing heart rate at rest. She has also noted lower extremity edema and orthopnea. She denies any clear triggers.  She denies any dietary changes, such as increase in salt or fluids.  No changes in activity.  She denies any new medications and reports medication compliance, including her  anticoagulation for PAF.  She denies any signs or symptoms of bleeding.  She usually wears 3 L nasal cannula oxygen at home.  She states that she is kept her oxygen at 3 L.  Due to her symptoms[facility]  In the emergency department, BP 141/65, HR 80 bpm.  Labs showed Na 134, Hgb 10.0 (and at baseline), Hct  30.8, glucose 172.  Cr 0.76 and BUN 16.  K 3.9. HS Tn 11 x2.  COVID-19 negative.  CXR report of mild fluid overload and mild interstitial edema with possible small L pleural effusion.  She was started on IV Lasix.  Cardiology was consulted for further evaluation.  Past Medical History:  Diagnosis Date  . (HFpEF) heart failure with preserved ejection fraction ([facility]    a. 2017 Echo: EF 50%; b. 06/2018 Echo: EF 50-55%; c. 08/2018 Echo: EF 50-55%, Nl RV fxn; d. 09/2019 Echo: EF 55-60%, no rwma, mild LVH, Gr1 DD, nl RV size/fxn, PASP 63.64mg. Mildly dil LA. Triv MR. Mod AS (AoV 0.94cm^2 VTI; mean grad 17.51m61m).  . Acute on chronic respiratory failure with hypoxia and hypercapnia (HCCR[place]1/05/2014  . Anemia   . 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 Asthma   . Cataract   . CKD (chronic kidney disease), stage III ([facility]CC[place] . COPD (chronic obstructive pulmonary disease) ([place]CC[place]  a. 06/2018 tobacco use, home 3L oxygen   . Diabetes mellitus without complication ([place]CC[place]  a. 09/2019 A1C 8.8  . Edema, peripheral 04/20/2014  . GI bleed 06/28/2019  . History of kidney stones   . Hyperlipidemia   .  Hypertension   . Iron deficiency anemia 06/22/2014  . Junctional bradycardia    a. In setting of beta blocker therapy.  . Leucocytosis 10/19/2015  . Moderate aortic stenosis    a.  09/2019 Echo: Mod AS (AoV 0.94cm^2 VTI; mean grad 17.37mHg).  . Morbid obesity (HB[place]   . Overactive bladder   . Primary osteoarthritis of right knee 09/01/2016  . Sciatica 01/07/2015    Past Surgical History:  Procedure Laterality Date  . APPENDECTOMY    . CESAREAN SECTION     x3  . CHOLECYSTECTOMY    . COLONOSCOPY  WITH PROPOFOL N/A 08/28/2017   Procedure: COLONOSCOPY WITH PROPOFOL;  Surgeon: WLucilla Lame MD;  [facility];  Service: Endoscopy;  Laterality: N/A;  . COLONOSCOPY WITH PROPOFOL N/A 08/29/2017   Procedure: COLONOSCOPY WITH PROPOFOL;  Surgeon: WLucilla Lame MD;  [facility];  Service: Endoscopy;  Laterality: N/A;  . CYSTOSCOPY W/ URETERAL STENT PLACEMENT Right 09/15/2017   Procedure: CYSTOSCOPY WITH RETROGRADE PYELOGRAM/URETERAL STENT PLACEMENT;  Surgeon: MCleon Gustin MD;  Location: [facility] ORS;  Service: Urology;  Laterality: Right;  . CYSTOSCOPY/URETEROSCOPY/HOLMIUM LASER/STENT PLACEMENT Right 10/09/2017   Procedure: CYSTOSCOPY/URETEROSCOPY/HOLMIUM LASER/STENT PLACEMENT;  Surgeon: SAbbie Sons MD;  Location: [facility] ORS;  Service: Urology;  Laterality: Right;  right Stent exchange  . ESOPHAGOGASTRODUODENOSCOPY (EGD) WITH PROPOFOL N/A 09/16/2018   Procedure: ESOPHAGOGASTRODUODENOSCOPY (EGD) WITH PROPOFOL;  Surgeon: VLin Landsman MD;  [facility] [place]  Service: Gastroenterology;  Laterality: N/A;  . EYE SURGERY       Home Medications:  Prior to Admission medications   Medication Sig Start Date End Date Taking? Authorizing Provider  acetaminophen (TYLENOL) 500 MG tablet Take 1-2 tablets (500-1,000 mg total) by mouth every 6 (six) hours as needed for mild pain or fever. Do not take more than 4 grams a day 05/25/20  Yes Rizwan, SEunice Blase MD  albuterol (VENTOLIN HFA) 108 (90 Base) MCG/ACT inhaler Inhale 2 puffs into the lungs every 6 (six) hours as needed for wheezing or shortness of breath.    Yes [provider]  atorvastatin (LIPITOR) 10 MG tablet Take 10 mg by mouth daily.    Yes [provider]  ELIQUIS 5 MG TABS tablet Take 1 tablet by mouth twice daily 03/11/20  Yes Gollan, TKathlene November MD  esomeprazole (NEXIUM) 40 MG capsule Take 40 mg by mouth daily. 12/17/18  Yes [provider]  Ferrous Sulfate (IRON) 325 (65 Fe) MG TABS Take 1  tablet by mouth daily. 09/14/17  Yes [provider]  fluticasone (FLONASE) 50 MCG/ACT nasal spray Place 2 sprays into both nostrils daily. 11/15/18  Yes [provider]  Fluticasone-Umeclidin-Vilant 100-62.5-25 MCG/INH AEPB Inhale 1 puff into the lungs daily. 12/25/17  Yes [provider]  hydrochlorothiazide (HYDRODIURIL) 25 MG tablet Take 1 tablet (25 mg total) by mouth daily. 05/31/20  Yes Gollan, TKathlene November MD  insulin glargine (LANTUS SOLOSTAR) 100 UNIT/ML Solostar Pen Inject 45 Units into the skin at bedtime. 05/25/20  Yes RDebbe Odea MD  insulin lispro (HUMALOG KWIKPEN) 100 UNIT/ML KwikPen Inject 10 Units into the skin 3 (three) times daily. 05/25/20  Yes RDebbe Odea MD  ipratropium (ATROVENT) 0.06 % nasal spray Place 2 sprays into both nostrils 4 (four) times daily. 05/28/20  Yes RMargarette Canada NP  losartan (COZAAR) 100 MG tablet Take 1 tablet (100 mg total) by mouth daily. 05/25/20 05/25/21 Yes RDebbe Odea MD  metFORMIN (GLUCOPHAGE) 500 MG tablet Take 1,000 mg by mouth daily.   Yes  [provider]  montelukast (SINGULAIR) 10 MG tablet Take 10 mg by mouth at bedtime.   Yes [provider]  OZEMPIC, 0.25 OR 0.5 MG/DOSE, 2 MG/1.5ML SOPN SMARTSIG:0.375 Milliliter(s) SUB-Q Once a Week 05/12/20  Yes [provider]  vitamin B-12 (CYANOCOBALAMIN) 500 MCG tablet Take 500 mcg by mouth daily.   Yes [provider]    Inpatient Medications: Scheduled Meds: . albuterol  2.5 mg Nebulization Q6H  . apixaban  5 mg Oral BID  . atorvastatin  10 mg Oral Daily  . ferrous sulfate  325 mg Oral Daily  . fluticasone  2 spray Each Nare Daily  . Fluticasone-Umeclidin-Vilant  1 puff Inhalation Daily  . furosemide  40 mg Intravenous BID  . insulin aspart  0-5 Units Subcutaneous QHS  . insulin aspart  0-9 Units Subcutaneous TID WC  . insulin aspart  5 Units Subcutaneous TID WC  . insulin glargine  45 Units Subcutaneous QHS  . ipratropium  2 spray  Each Nare QID  . losartan  100 mg Oral Daily  . montelukast  10 mg Oral QHS  . pantoprazole  40 mg Oral Daily  . vitamin B-12  500 mcg Oral Daily   Continuous Infusions:  PRN Meds: acetaminophen **OR** acetaminophen, albuterol, magnesium hydroxide, ondansetron **OR** ondansetron (ZOFRAN) IV, traZODone  Allergies:    Allergies  Allergen Reactions  . Ace Inhibitors Hives  . Beta Adrenergic Blockers     Junctional bradycardia  . Gabapentin Hives  . Lisinopril Hives  . Lyrica [Pregabalin] Hives  . Shrimp [Shellfish Allergy] Swelling    Swelling of the lips    Social History:   Social History   Socioeconomic History  . Marital status: Married    Spouse name: Not on file  . Number of children: Not on file  . Years of education: Not on file  . Highest education level: Not on file  Occupational History  . Not on file  Tobacco Use  . Smoking status: Former Smoker    Packs/day: 1.00    Years: 20.00    Pack years: 20.00    Quit date: 12/04/1992    Years since quitting: 27.5  . Smokeless tobacco: Never Used  Vaping Use  . Vaping Use: Never used  Substance and Sexual Activity  . Alcohol use: No  . Drug use: No  . Sexual activity: Not Currently  Other Topics Concern  . Not on file  Social History Narrative  . Not on file   Social Determinants of Health   Financial Resource Strain: Not on file  Food Insecurity: Not on file  Transportation Needs: Not on file  Physical Activity: Not on file  Stress: Not on file  Social Connections: Not on file  Intimate Partner Violence: Not on file    Family History:    Family History  Problem Relation Age of Onset  . Other Mother        unknown medical history  . Other Father        unknown medical history     ROS:  Please see the history of present illness.  Review of Systems  Respiratory: Positive for shortness of breath. Negative for cough.   Cardiovascular: Positive for chest pain, palpitations, orthopnea and leg  swelling. Negative for claudication.  Gastrointestinal: Negative for blood in stool, melena, nausea and vomiting.  Genitourinary: Negative for hematuria.  All other systems reviewed and are negative.   All other ROS reviewed and negative.  Physical Exam/Data:   Vitals:   06/18/20 1400 06/18/20 1407 06/18/20 1409 06/18/20 1410  BP:  135/68    Pulse: 78 82 75 79  Resp: (!) 28 (!) 27 (!) 25 19  Temp:      TempSrc:      SpO2: 95% 97% 96% 97%  Weight:      Height:        Intake/Output Summary (Last 24 hours) at 06/18/2020 1504 Last data filed at 06/18/2020 1238 Gross per 24 hour  Intake --  Output 801 ml  Net -801 ml   Last 3 Weights 06/17/2020 05/31/2020 05/28/2020  Weight (lbs) 210 lb 214 lb 316 lb  Weight (kg) 95.255 kg 97.07 kg 143.337 kg     Body mass index is 37.2 kg/m.  General: Obese female, no acute distress HEENT: normal.  3 L nasal cannula oxygen. Neck: JVD difficult to assess due to body habitus Vascular: radial pulses 2+ bilaterally  Cardiac:  normal S1, S2; RRR; 2/6 systolic murmur appreciated throughout the cardiac exam Lungs: Poor inspiratory effort, bilateral reduced breath sounds Abd: Obese, slightly firm, nontender, no hepatomegaly  Ext: Moderate to 1+ bilateral LEE Musculoskeletal:  No deformities Skin: warm and dry  Neuro:  no focal abnormalities noted Psych:  Normal affect   EKG:  The EKG was personally reviewed and demonstrates: NSR, 80 bpm, nonspecific changes Telemetry:  Telemetry was personally reviewed and demonstrates: NSR, PVCs, 2.02 second pause noted  Relevant CV Studies: Echo 09/28/2019 1. Left ventricular ejection fraction, by estimation, is 55 to 60%. The  left ventricle has normal function. The left ventricle has no regional  wall motion abnormalities. There is mild left ventricular hypertrophy.  Left ventricular diastolic parameters  are consistent with Grade I diastolic dysfunction (impaired relaxation).  2. Right ventricular  systolic function is normal. The right ventricular  size is normal. There is severely elevated pulmonary artery systolic  pressure. The estimated right ventricular systolic pressure is [id number] mmHg.  3. Left atrial size was mildly dilated.  4. The mitral valve is normal in structure. Trivial mitral valve  regurgitation. No evidence of mitral stenosis.  5. The aortic valve is abnormal. Aortic valve regurgitation is not  visualized. Moderate aortic valve stenosis. Aortic valve area, by VTI  measures 0.94 cm. Aortic valve mean gradient measures 17.3 mmHg.   Cardiac monitoring 02/27/20 Normal sinus rhythm with paroxysmal atrial fibrillation avg HR of 89 bpm.  1 run of Ventricular Tachycardia occurred lasting 4 beats with a max rate of 179 bpm (avg 161 bpm).  Atrial Fibrillation occurred (<1% burden), ranging from 75-179 bpm (avg of 117 bpm), the longest lasting 32 mins 50 secs with an avg rate of 119 bpm.  Isolated SVEs were occasional (1.4%, 25270), SVE Couplets were rare (<1.0%, 2710), and SVE Triplets were rare (<1.0%, 299). Isolated VEs were rare (<1.0%, 617), VE Couplets were rare (<1.0%, 4), and VE Triplets were rare (<1.0%, 1). Ventricular Bigeminy was present.  Laboratory Data:  High Sensitivity Troponin:   Recent Labs  Lab 05/21/20 2034 06/17/20 1808 06/17/20 2209  TROPONINIHS '12 11 11     '$ Chemistry Recent Labs  Lab 06/17/20 1808 06/18/20 0653  NA 134* 138  K 4.6 3.9  CL 99 96*  CO2 27 31  GLUCOSE 181* 93  BUN 18 16  CREATININE 0.86 0.76  CALCIUM 9.1 9.1  GFRNONAA >60 >60  ANIONGAP 8 11    No results for input(s): PROT, ALBUMIN, AST, ALT, ALKPHOS, BILITOT in the last  168 hours. Hematology Recent Labs  Lab 06/17/20 1808 06/18/20 0653  WBC 10.3 10.0  RBC 3.44* 3.63*  HGB 10.0* 10.4*  HCT 30.8* 32.4*  MCV 89.5 89.3  MCH 29.1 28.7  MCHC 32.5 32.1  RDW 14.6 14.5  PLT 253 248   BNPNo results for input(s): BNP, PROBNP in the last 168 hours.  DDimer No  results for input(s): DDIMER in the last 168 hours.   Radiology/Studies:  DG Chest 2 View  Result Date: 06/17/2020 CLINICAL DATA:  Short of breath EXAM: CHEST - 2 VIEW COMPARISON:  05/21/2020 FINDINGS: Mild cardiac enlargement. Progressive vascular congestion. Small pleural effusion on the lateral view. Possible mild interstitial edema has developed. IMPRESSION: Findings consistent with mild fluid overload with mild interstitial edema and small pleural effusion. Electronically Signed   By: Franchot Gallo M.D.   On: 06/17/2020 19:23     Assessment and Plan:   Acute on chronic heart failure with preserved ejection fraction G1DD, pulmonary hypertension, moderate aortic stenosis Chronic hypoxic respiratory failure/COPD -- Reports 3 days of SOB, LEE, orthopnea. NYHAIII. 09/2019 echo with EF 55-60, elevated PASP/RVSP 63.6 mmHg, RAP 10 mmHg.  SOB likely multifactorial in the setting of known COPD (3 L [place] O2 at baseline), HFpEF/pulmonary HTN, PAF, and moderate AS.  Volume status is difficult to assess due to body habitus, however, suspect she is at least mildly volume up.   Echo ordered to look at EF/WM and valvular function / severity of aortic stenosis, as well as heart pressures, given her sx and presentation as outlined above.    Continue IV diuresis with Lasix 40 mg twice daily as renal function allows  Daily BMET. Monitor Cr, electrolytes.  Cr 0.76, BUN 16.  Monitor I's/O's.   Output net -800cc.  Daily weights.   Wt 210lbs. (clinic wt 214lbs)  Hold PTA HCTZ to prevent electrolyte abnormalities.    Continue losartan 100 mg daily.   PAF on [place] -- Currently NSR.  History of PAF on anticoagulation.   Continue to monitor on telemetry, given report of racing HR.   --BMET. Monitor electrolytes.   --Check TSH given racing HR.   -- Not on any rate controlling medications given her history of junctional bradycardia on  BB/CCB. --Continue [place] with daily CBC, given history of GI bleed.   CHA2DS2VASc score of at least  6. She denies any signs or symptoms of GI bleed today during consultation.  Sinus pause on telemetry History of BB induced  junctional bradycardia -- Continue to monitor on telemetry.  As above, not on AV nodal blocking agents due to history of junctional bradycardia.  Continue to avoid BB/CCB. Will check TSH. Continue to monitor electrolytes.   Essential hypertension -- BP elevated at presentation.  Suspect improvement with diuresis.    Continue IV Lasix and losartan as renal function allows.  Hold PTA HCTZ.  HLD -- Continue statin.  LDL goal below 70.  Anemia -- Chronic with baseline hemoglobin 10.0-11.  Continue to monitor with daily CBC.  DM2 -- SSI, per IM     HEAR Score (for undifferentiated chest pain):  HEAR Score: 5   New York Heart Association (NYHA) Functional Class NYHA Class IV  CHA2DS2-VASc Score = 6  This indicates a 9.7% annual risk of stroke. The patient's score is based upon: CHF History: Yes HTN History: Yes Diabetes History: Yes Stroke History: No Vascular Disease History: No Age Score: 2 Gender Score: 1         For questions  or updates, please contact [place] Please consult [email or web address] for contact info under    Signed, Arvil Chaco, PA-C  06/18/2020 3:04 PM

## 2020-06-18 NOTE — Progress Notes (Signed)
Received report from [place]. Patient had an incontinent episode of stool, was changed and repositioned, is resting quietly on stretcher at this time. Denies further needs. Call bell within reach.

## 2020-06-19 ENCOUNTER — Inpatient Hospital Stay (HOSPITAL_COMMUNITY)
Admit: 2020-06-19 | Discharge: 2020-06-19 | Disposition: A | Discharge status: Home / Self Care | Payer: Medicare Other | Attending: Physician Assistant | Admitting: Physician Assistant

## 2020-06-19 DIAGNOSIS — I5031 Acute diastolic (congestive) heart failure: Secondary | ICD-10-CM

## 2020-06-19 DIAGNOSIS — I350 Nonrheumatic aortic (valve) stenosis: Secondary | ICD-10-CM | POA: Diagnosis not present

## 2020-06-19 LAB — BASIC METABOLIC PANEL
Anion gap: 10 (ref 5–15)
BUN: 19 mg/dL (ref 8–23)
CO2: 32 mmol/L (ref 22–32)
Calcium: 9 mg/dL (ref 8.9–10.3)
Chloride: 92 mmol/L — ABNORMAL LOW (ref 98–111)
Creatinine, Ser: 0.99 mg/dL (ref 0.44–1.00)
GFR, Estimated: 59 mL/min — ABNORMAL LOW (ref >60)
Glucose, Bld: 266 mg/dL — ABNORMAL HIGH (ref 70–99)
Potassium: 3.9 mmol/L (ref 3.5–5.1)
Sodium: 134 mmol/L — ABNORMAL LOW (ref 135–145)

## 2020-06-19 LAB — MAGNESIUM: Magnesium: 1.7 mg/dL (ref 1.7–2.4)

## 2020-06-19 LAB — HEPATITIS C ANTIBODY: HCV Ab: NONREACTIVE

## 2020-06-19 LAB — IRON AND TIBC
Iron: 43 ug/dL (ref 28–170)
Saturation Ratios: 10 % — ABNORMAL LOW (ref 10.4–31.8)
TIBC: 421 ug/dL (ref 250–450)
UIBC: 378 ug/dL

## 2020-06-19 LAB — ECHOCARDIOGRAM COMPLETE
AR max vel: 1.09 cm2
AV Area VTI: 1.12 cm2
AV Area mean vel: 1.06 cm2
AV Mean grad: 14 mmHg
AV Peak grad: 26.9 mmHg
Ao pk vel: 2.59 m/s
Area-P 1/2: 2.92 cm2
Height: 63 in
S' Lateral: 3.05 cm
Weight: 3428.59 oz

## 2020-06-19 LAB — GLUCOSE, CAPILLARY: Glucose-Capillary: 195 mg/dL — ABNORMAL HIGH (ref 70–99)

## 2020-06-19 LAB — VITAMIN D 25 HYDROXY (VIT D DEFICIENCY, FRACTURES): Vit D, 25-Hydroxy: 53.88 ng/mL (ref 30–100)

## 2020-06-19 LAB — HEPATITIS B SURFACE ANTIGEN: Hepatitis B Surface Ag: NONREACTIVE

## 2020-06-19 LAB — FOLATE: Folate: 16.3 ng/mL (ref >5.9)

## 2020-06-19 LAB — VITAMIN B12: Vitamin B-12: 495 pg/mL (ref 180–914)

## 2020-06-19 LAB — HEPATITIS B CORE ANTIBODY, TOTAL: Hep B Core Total Ab: NONREACTIVE

## 2020-06-19 LAB — BRAIN NATRIURETIC PEPTIDE: B Natriuretic Peptide: 55.6 pg/mL (ref 0.0–100.0)

## 2020-06-19 LAB — PHOSPHORUS: Phosphorus: 3.8 mg/dL (ref 2.5–4.6)

## 2020-06-19 MED ORDER — ORAL CARE MOUTH RINSE
15.0000 mL | Freq: Two times a day (BID) | OROMUCOSAL | Status: DC
  Administered 2020-06-19 – 2020-06-21 (×6): 15 mL via OROMUCOSAL

## 2020-06-19 NOTE — Progress Notes (Signed)
*  PRELIMINARY RESULTS* Echocardiogram 2D Echocardiogram has been performed.  Karen [name] 06/19/2020, 9:40 AM

## 2020-06-19 NOTE — Progress Notes (Signed)
Progress Note  Patient Name: Karen [name] Date of Encounter: 06/19/2020  Primary Cardiologist: Ida Rogue, MD   Subjective   "I am a little better." Denies chest pain. Still with dyspnea.  Inpatient Medications    Scheduled Meds: . albuterol  2.5 mg Nebulization Q6H  . apixaban  5 mg Oral BID  . atorvastatin  10 mg Oral Daily  . ferrous sulfate  325 mg Oral Daily  . fluticasone  2 spray Each Nare Daily  . fluticasone furoate-vilanterol  1 puff Inhalation Daily   And  . umeclidinium bromide  1 puff Inhalation Daily  . furosemide  40 mg Intravenous BID  . insulin aspart  0-5 Units Subcutaneous QHS  . insulin aspart  0-9 Units Subcutaneous TID WC  . insulin aspart  5 Units Subcutaneous TID WC  . insulin glargine  45 Units Subcutaneous QHS  . ipratropium  2 spray Each Nare QID  . losartan  100 mg Oral Daily  . mouth rinse  15 mL Mouth Rinse BID  . montelukast  10 mg Oral QHS  . pantoprazole  40 mg Oral Daily  . vitamin B-12  500 mcg Oral Daily   Continuous Infusions:  PRN Meds: acetaminophen **OR** acetaminophen, albuterol, magnesium hydroxide, ondansetron **OR** ondansetron (ZOFRAN) IV, traZODone   Vital Signs    Vitals:   06/19/20 0208 06/19/20 0500 06/19/20 0555 06/19/20 0739  BP:   128/61 120/62  Pulse:   91 80  Resp:   16 18  Temp:   97.8 F (36.6 C) 98.4 F (36.9 C)  TempSrc:   Oral   SpO2: 95%  96% 95%  Weight:  97.2 kg    Height:        Intake/Output Summary (Last 24 hours) at 06/19/2020 1123 Last data filed at 06/19/2020 1010 Gross per 24 hour  Intake 840 ml  Output 2600 ml  Net -1760 ml   Filed Weights   06/17/20 1802 06/19/20 0500  Weight: 95.3 kg 97.2 kg    Telemetry    nsr at 80/min - Personally Reviewed  ECG    none - Personally Reviewed  Physical Exam   GEN: chronically ill appearing, No acute distress.   Neck: 6 cm JVD Cardiac: RRR, no murmurs, rubs, or gallops. Heart sounds are distant. Respiratory: Clear to  auscultation bilaterally. GI: Soft, nontender, non-distended  MS: No edema; No deformity. Neuro:  Nonfocal  Psych: Normal affect   Labs    Chemistry Recent Labs  Lab 06/17/20 1808 06/18/20 0653 06/19/20 0503  NA 134* 138 134*  K 4.6 3.9 3.9  CL 99 96* 92*  CO2 27 31 32  GLUCOSE 181* 93 266*  BUN '18 16 19  '$ CREATININE 0.86 0.76 0.99  CALCIUM 9.1 9.1 9.0  GFRNONAA >60 >60 59*  ANIONGAP '8 11 10     '$ Hematology Recent Labs  Lab 06/17/20 1808 06/18/20 0653  WBC 10.3 10.0  RBC 3.44* 3.63*  HGB 10.0* 10.4*  HCT 30.8* 32.4*  MCV 89.5 89.3  MCH 29.1 28.7  MCHC 32.5 32.1  RDW 14.6 14.5  PLT 253 248    Cardiac EnzymesNo results for input(s): TROPONINI in the last 168 hours. No results for input(s): TROPIPOC in the last 168 hours.   BNP Recent Labs  Lab 06/19/20 0941  BNP 55.6     DDimer No results for input(s): DDIMER in the last 168 hours.   Radiology    DG Chest 2 View  Result Date: 06/17/2020 CLINICAL  DATA:  Short of breath EXAM: CHEST - 2 VIEW COMPARISON:  05/21/2020 FINDINGS: Mild cardiac enlargement. Progressive vascular congestion. Small pleural effusion on the lateral view. Possible mild interstitial edema has developed. IMPRESSION: Findings consistent with mild fluid overload with mild interstitial edema and small pleural effusion. Electronically Signed   By: Franchot Gallo M.D.   On: 06/17/2020 19:23    Cardiac Studies   reviewed  Patient Profile     75 y.o. female admitted with HFPEF, with respiratory failure.   Assessment & Plan    1. Acute on chronic diastolic CHF - her symptoms are not well controlled. Her weight is up 2 kg. We will uptitrate her diuretic. 2. HTN - her bp is well controlled. 3. PAF - she is maintaining NSR. Continue eliquis. 4. Pulmonary HTN - she has a repeat echo pending. We will follow.     For questions or updates, please contact [place] Please consult [email or web address] for contact info under Cardiology/STEMI.       Signed, Cristopher Peru, MD  06/19/2020, 11:23 AM  Patient ID: Karen [name], female   DOB: 08/22/1945, 75 y.o.   MRN: [id number]

## 2020-06-19 NOTE — Evaluation (Signed)
Physical Therapy Evaluation Patient Details Name: Karen [name] MRN: [id number] DOB: Dec 28, 1945 Today's Date: 06/19/2020   History of Present Illness  Pt is a 75 y/o F admitted from home on 06/17/20 with c/c of SOB & worsening BLE edema. Pt being treated for acute on chronic diastolic CHF with subsequent acute on chronic hypoxic respiratory failure. PMH: heart failure with preserved ejection fraction of 55-60% with grade 1 diastolic dysfunction, HTN, dyslipidemia, COPD, chronic respiratory failure on home O2 at 3L/min, stage 3 CKD, DM2, asthma, cataract, morbid obesity, R knee OA, siatica  Clinical Impression  Pt seen for PT evaluation with pt on 3.5L/min via nasal cannula at rest, 4L/min with activity & SpO2 >90% throughout. Pt is able to complete bed mobility with hospital bed features & supervision, transfers with CGA & gait with RW & supervision in bathroom & into hallway. Pt demonstrates impaired gait pattern as noted below & decreased activity tolerance. Pt would benefit from acute PT services to address deficits noted below.     Follow Up Recommendations Home health PT;Supervision for mobility/OOB    Equipment Recommendations  None recommended by PT (pt already has RW & rollator)    Recommendations for Other Services       Precautions / Restrictions Precautions Precautions: Fall Restrictions Weight Bearing Restrictions: No      Mobility  Bed Mobility Overal bed mobility: Needs Assistance Bed Mobility: Supine to Sit     Supine to sit: Supervision;HOB elevated     General bed mobility comments: extra time, reliant on bed rails    Transfers Overall transfer level: Needs assistance Equipment used: Rolling walker (2 wheeled) Transfers: Sit to/from Stand Sit to Stand: Min guard         General transfer comment: cuing for hand placement, STS from EOB & toilet with use of grab bars  Ambulation/Gait Ambulation/Gait assistance: Supervision Gait Distance (Feet): 12  Feet (+ 75 ft) Assistive device: Rolling walker (2 wheeled) Gait Pattern/deviations: Decreased step length - right;Decreased step length - left;Decreased dorsiflexion - left;Decreased dorsiflexion - right;Decreased stride length;Trunk flexed Gait velocity: very decreased   General Gait Details: forward trunk lean on to RW, very minimal BLE heel strike  Stairs            Wheelchair Mobility    Modified Rankin (Stroke Patients Only)       Balance Overall balance assessment: Needs assistance Sitting-balance support: Feet supported;No upper extremity supported Sitting balance-Leahy Scale: Good Sitting balance - Comments: sitting on toilet without LOB     Standing balance-Leahy Scale: Fair Standing balance comment: pt can stand to perform hand hygiene with CGA                             Pertinent Vitals/Pain Pain Assessment: 0-10 Pain Score: 9  Pain Location: L side (reports L side pain she has experienced before from lying down & not moving around) Pain Descriptors / Indicators: Aching Pain Intervention(s): Limited activity within patient's tolerance;Monitored during session;Repositioned (notified RN)    Home Living Family/patient expects to be discharged to:: Private residence Living Arrangements: Spouse/significant other;Children Available Help at Discharge: Family;Available 24 hours/day Type of Home: Mobile home Home Access: Ramped entrance     Home Layout: One level Home Equipment: [place] - 2 wheels;Cane - single point;Grab bars - tub/shower (rollator)      Prior Function Level of Independence: Independent with assistive device(s)         Comments: Mod  I with rollator     Hand Dominance   Dominant Hand: Right    Extremity/Trunk Assessment   Upper Extremity Assessment Upper Extremity Assessment: Generalized weakness    Lower Extremity Assessment Lower Extremity Assessment: Generalized weakness    Cervical / Trunk  Assessment Cervical / Trunk Assessment: Kyphotic  Communication   Communication: No difficulties  Cognition Arousal/Alertness: Awake/alert Behavior During Therapy: WFL for tasks assessed/performed Overall Cognitive Status: Within Functional Limits for tasks assessed                                        General Comments General comments (skin integrity, edema, etc.): Pt with continent void on toilet, performing hand hygiene with CGA for standing balance.    Exercises     Assessment/Plan    PT Assessment Patient needs continued PT services  PT Problem List Decreased mobility;Decreased strength;Decreased activity tolerance;Cardiopulmonary status limiting activity;Decreased knowledge of use of DME;Decreased balance;Pain       PT Treatment Interventions DME instruction;Therapeutic activities;Modalities;Gait training;Therapeutic exercise;Patient/family education;Balance training;Functional mobility training;Manual techniques;Neuromuscular re-education    PT Goals (Current goals can be found in the Care Plan section)  Acute Rehab PT Goals Patient Stated Goal: get better PT Goal Formulation: With patient Time For Goal Achievement: 07/03/20 Potential to Achieve Goals: Fair    Frequency Min 2X/week   Barriers to discharge        Co-evaluation               AM-PAC PT "6 Clicks" Mobility  Outcome Measure Help needed turning from your back to your side while in a flat bed without using bedrails?: None Help needed moving from lying on your back to sitting on the side of a flat bed without using bedrails?: A Little Help needed moving to and from a bed to a chair (including a wheelchair)?: A Little Help needed standing up from a chair using your arms (e.g., wheelchair or bedside chair)?: A Little Help needed to walk in hospital room?: A Little Help needed climbing 3-5 steps with a railing? : A Lot 6 Click Score: 18    End of Session Equipment Utilized During  Treatment: Gait belt;Oxygen Activity Tolerance: Patient tolerated treatment well Patient left: in chair;with call bell/phone within reach;with chair alarm set Nurse Communication: Mobility status (c/o pain) PT Visit Diagnosis: Unsteadiness on feet (R26.81);Muscle weakness (generalized) (M62.81)    Time: [id number] PT Time Calculation (min) (ACUTE ONLY): 24 min   Charges:   PT Evaluation $PT Eval Low Complexity: 1 Low PT Treatments $Therapeutic Activity: 8-22 mins        Lavone Nian, PT, DPT 06/19/20, 3:08 PM   Waunita Schooner 06/19/2020, 3:06 PM

## 2020-06-19 NOTE — Progress Notes (Signed)
[facility] Progress Note  Patient: Karen [name]    [id number]  DOA: 06/17/2020     Date of Service: the patient was seen and examined on 06/19/2020  Chief Complaint  Patient presents with  . Shortness of Breath   Brief hospital course: Brief history.  Patient admitted 06/17/2020 in the evening with acute on chronic diastolic congestive heart failure.  Patient has chronic hypoxic respiratory failure on 3 L.  Other history includes essential hypertension, hyperlipidemia, paroxysmal atrial fibrillation.  Patient was started on IV Lasix.  Assessment and Plan:  # Acute diastolic congestive heart failure, moderate aortic stenosis on last echocardiogram.   Patient is on IV Lasix 40 mg IV twice daily.   BNP actually on the lower side.  Not quite sure what to make of this.   Patient not on beta-blocker secondary to junctional bradycardia when taking this class of medications. BNP 55 within normal range  # Acute on chronic hypoxic respiratory failure.  The patient did have a pulse ox of 85% early this morning.  Unclear if that was on oxygen or off oxygen.  Patient was on 4 L of oxygen when I saw her and dialed her down to her normal 3 L.  I added her inhaler and nebulizer treatments in case COPD playing a role with her trouble breathing.  Holding off on steroids at this point.  Covid negative.  # Hypertension, continue losartan Monitor BP  # Type 2 diabetes mellitus with hyperlipidemia on glargine insulin and sliding scale insulin.  Also on atorvastatin.  Hemoglobin last month up at 11.6.  #Paroxysmal atrial fibrillation on Eliquis for anticoagulation.  Not on medications for rate control. TSH 2.3 within normal range  #Weakness.  Physical therapy evaluation  #Obesity with a BMI of 37.20  # GERD on Protonix  # CT scan showing enlargement of the left lobe of the liver.  Will check hepatitis profiles.  No ascites seen on CT scan  # Iron deficiency, iron saturation 10% continue   oral supplement   Body mass index is 37.96 kg/m.   Interventions:        Diet: Heart healthy/carb modified DVT Prophylaxis: Therapeutic Anticoagulation with Eliquis   Advance goals of care discussion: Full code  Family Communication: family was NOT present at bedside, at the time of interview.  The pt provided permission to discuss medical plan with the family. Opportunity was given to ask question and all questions were answered satisfactorily.   Disposition:  Pt is from Home, admitted with CHF exacerbation, still has respiratory failure and volume overload, which precludes a safe discharge. Discharge to home, when cleared by cardiology.  Subjective: No significant overnight issues, patient was admitted due to volume overload and shortness of breath, patient feels provement but is still she is feeling tired and mild shortness of breath, shaking has been resolved Patient denied any chest pain or palpitations.   Physical Exam: General:  alert oriented to time, place, and person.  Appear in mild distress, affect appropriate Eyes: PERRLA ENT: Oral Mucosa Clear, moist  Neck: no JVD,  Cardiovascular: S1 and S2 Present, no Murmur,  Respiratory: good respiratory effort, Bilateral Air entry equal and Decreased, mild  Crackles, no wheezes Abdomen: Bowel Sound present, Soft and no tenderness,  Skin: no rashes Extremities: mild Pedal edema, no calf tenderness Neurologic: without any new focal findings Gait not checked due to patient safety concerns  Vitals:   06/19/20 0500 06/19/20 0555 06/19/20 0739 06/19/20 1143  BP:  128/61 120/62 (!) 136/55  Pulse:  91 80 77  Resp:  '16 18 18  '$ Temp:  97.8 F (36.6 C) 98.4 F (36.9 C) 98.1 F (36.7 C)  TempSrc:  Oral  Oral  SpO2:  96% 95% 97%  Weight: 97.2 kg     Height:        Intake/Output Summary (Last 24 hours) at 06/19/2020 1607 Last data filed at 06/19/2020 1145 Gross per 24 hour  Intake 840 ml  Output 1950 ml  Net -1110 ml    Filed Weights   06/17/20 1802 06/19/20 0500  Weight: 95.3 kg 97.2 kg    Data Reviewed: I have personally reviewed and interpreted daily labs, tele strips, imagings as discussed above. I reviewed all nursing notes, pharmacy notes, vitals, pertinent old records I have discussed plan of care as described above with RN and patient/family.  CBC: Recent Labs  Lab 06/17/20 1808 06/18/20 0653  WBC 10.3 10.0  HGB 10.0* 10.4*  HCT 30.8* 32.4*  MCV 89.5 89.3  PLT 253 [id number]   Basic Metabolic Panel: Recent Labs  Lab 06/17/20 1808 06/18/20 0653 06/19/20 0503 06/19/20 0941  NA 134* 138 134*  --   K 4.6 3.9 3.9  --   CL 99 96* 92*  --   CO2 27 31 32  --   GLUCOSE 181* 93 266*  --   BUN '18 16 19  '$ --   CREATININE 0.86 0.76 0.99  --   CALCIUM 9.1 9.1 9.0  --   MG  --   --   --  1.7  PHOS  --   --   --  3.8    Studies: ECHOCARDIOGRAM COMPLETE  Result Date: 06/19/2020    ECHOCARDIOGRAM REPORT   Patient Name:   AMELINA ROSES Date of Exam: 06/19/2020 Medical Rec #:  [id number]        Height:       63.0 in Accession #:    [id number]       Weight:       214.3 lb Date of Birth:  1945/12/15       BSA:          1.991 m Patient Age:    75 years         BP:           145/82 mmHg Patient Gender: F                HR:           73 bpm. Exam Location:  [facility] Procedure: 2D Echo Indications:     Aortic stenosis I35.0  History:         Patient has prior history of Echocardiogram examinations, most                  recent 09/28/2019.  Sonographer:     Kathlen Brunswick RDCS Referring Phys:  [id number] [place] Diagnosing Phys: Dorris Carnes MD IMPRESSIONS  1. Left ventricular ejection fraction, by estimation, is 60 to 65%. The left ventricle has normal function. There is mild left ventricular hypertrophy.  2. Right ventricular systolic function is normal. The right ventricular size is normal.  3. Trivial mitral valve regurgitation.  4. AV is thickened, calcified Difficult to see well Peak and mean  gradients through the vlave are 30 and 15 mm Hg respectively AVA (VTI) is 1.03 cm2. Dimensionless index is 0.44. All consistent with mild to moderate AS. Compared to echo report from  September 28, 2019 no significant change . Aortic valve regurgitation is not visualized.  5. The inferior vena cava is normal in size with greater than 50% respiratory variability, suggesting right atrial pressure of 3 mmHg. FINDINGS  Left Ventricle: Left ventricular ejection fraction, by estimation, is 60 to 65%. The left ventricle has normal function. Global longitudinal strain performed but not reported based on interpreter judgement due to suboptimal tracking. The left ventricular internal cavity size was normal in size. There is mild left ventricular hypertrophy. Right Ventricle: The right ventricular size is normal. Right vetricular wall thickness was not assessed. Right ventricular systolic function is normal. Left Atrium: Left atrial size was normal in size. Right Atrium: Right atrial size was normal in size. Pericardium: There is no evidence of pericardial effusion. Mitral Valve: There is mild thickening of the mitral valve leaflet(s). Mild mitral annular calcification. Trivial mitral valve regurgitation. Tricuspid Valve: The tricuspid valve is normal in structure. Tricuspid valve regurgitation is trivial. Aortic Valve: AV is thickened, calcified Difficult to see well Peak and mean gradients through the vlave are 30 and 15 mm Hg respectively AVA (VTI) is 1.03 cm2. Dimensionless index is 0.44. All consistent with mild to moderate AS. Compared to echo report  from September 28, 2019 no significant change. Aortic valve regurgitation is not visualized. Aortic valve mean gradient measures 14.0 mmHg. Aortic valve peak gradient measures 26.9 mmHg. Aortic valve area, by VTI measures 1.12 cm. Pulmonic Valve: The pulmonic valve was not well visualized. Pulmonic valve regurgitation is not visualized. Aorta: The aortic root is normal in size and  structure. Venous: The inferior vena cava is normal in size with greater than 50% respiratory variability, suggesting right atrial pressure of 3 mmHg. IAS/Shunts: No atrial level shunt detected by color flow Doppler.  LEFT VENTRICLE PLAX 2D LVIDd:         4.56 cm  Diastology LVIDs:         3.05 cm  LV e' medial:    5.44 cm/s LV PW:         1.23 cm  LV E/e' medial:  15.2 LV IVS:        1.32 cm  LV e' lateral:   7.07 cm/s LVOT diam:     1.80 cm  LV E/e' lateral: 11.7 LV SV:         61 LV SV Index:   31 LVOT Area:     2.54 cm  RIGHT VENTRICLE RV Basal diam:  3.35 cm RV S prime:     14.30 cm/s TAPSE (M-mode): 3.0 cm LEFT ATRIUM             Index       RIGHT ATRIUM           Index LA diam:        3.80 cm 1.91 cm/m  RA Area:     14.00 cm LA Vol (A2C):   49.0 ml 24.61 ml/m RA Volume:   29.80 ml  14.97 ml/m LA Vol (A4C):   35.2 ml 17.68 ml/m LA Biplane Vol: 42.2 ml 21.19 ml/m  AORTIC VALVE AV Area (Vmax):    1.09 cm AV Area (Vmean):   1.06 cm AV Area (VTI):     1.12 cm AV Vmax:           259.20 cm/s AV Vmean:          170.800 cm/s AV VTI:            0.541 m  AV Peak Grad:      26.9 mmHg AV Mean Grad:      14.0 mmHg LVOT Vmax:         111.00 cm/s LVOT Vmean:        71.300 cm/s LVOT VTI:          0.239 m LVOT/AV VTI ratio: 0.44  AORTA Ao Root diam: 2.40 cm MITRAL VALVE                TRICUSPID VALVE MV Area (PHT): 2.92 cm     TV Peak grad:   29.4 mmHg MV Decel Time: 260 msec     TV Vmax:        2.71 m/s MV E velocity: 82.70 cm/s MV A velocity: 110.00 cm/s  SHUNTS MV E/A ratio:  0.75         Systemic VTI:  0.24 m                             Systemic Diam: 1.80 cm Dorris Carnes MD Electronically signed by Dorris Carnes MD Signature Date/Time: 06/19/2020/2:31:40 PM    Final     Scheduled Meds: . albuterol  2.5 mg Nebulization Q6H  . apixaban  5 mg Oral BID  . atorvastatin  10 mg Oral Daily  . ferrous sulfate  325 mg Oral Daily  . fluticasone  2 spray Each Nare Daily  . fluticasone furoate-vilanterol  1 puff Inhalation  Daily   And  . umeclidinium bromide  1 puff Inhalation Daily  . furosemide  40 mg Intravenous BID  . insulin aspart  0-5 Units Subcutaneous QHS  . insulin aspart  0-9 Units Subcutaneous TID WC  . insulin aspart  5 Units Subcutaneous TID WC  . insulin glargine  45 Units Subcutaneous QHS  . ipratropium  2 spray Each Nare QID  . losartan  100 mg Oral Daily  . mouth rinse  15 mL Mouth Rinse BID  . montelukast  10 mg Oral QHS  . pantoprazole  40 mg Oral Daily  . vitamin B-12  500 mcg Oral Daily   Continuous Infusions: PRN Meds: acetaminophen **OR** acetaminophen, albuterol, magnesium hydroxide, ondansetron **OR** ondansetron (ZOFRAN) IV, traZODone  Time spent: 35 minutes  Author: Val Riles. MD [facility] 06/19/2020 4:07 PM  To reach On-call, see care teams to locate the attending and reach out to them via [email or web address]. If 7PM-7AM, please contact night-coverage If you still have difficulty reaching the attending provider, please page the [facility] (Director on Call) for [facility] on amion for assistance.

## 2020-06-20 LAB — CBC
HCT: 34.5 % — ABNORMAL LOW (ref 36.0–46.0)
Hemoglobin: 11.1 g/dL — ABNORMAL LOW (ref 12.0–15.0)
MCH: 28.5 pg (ref 26.0–34.0)
MCHC: 32.2 g/dL (ref 30.0–36.0)
MCV: 88.5 fL (ref 80.0–100.0)
Platelets: 276 10*3/uL (ref 150–400)
RBC: 3.9 MIL/uL (ref 3.87–5.11)
RDW: 14.7 % (ref 11.5–15.5)
WBC: 9 10*3/uL (ref 4.0–10.5)
nRBC: 0 % (ref 0.0–0.2)

## 2020-06-20 LAB — BASIC METABOLIC PANEL
Anion gap: 11 (ref 5–15)
BUN: 30 mg/dL — ABNORMAL HIGH (ref 8–23)
CO2: 33 mmol/L — ABNORMAL HIGH (ref 22–32)
Calcium: 9.1 mg/dL (ref 8.9–10.3)
Chloride: 89 mmol/L — ABNORMAL LOW (ref 98–111)
Creatinine, Ser: 1.25 mg/dL — ABNORMAL HIGH (ref 0.44–1.00)
GFR, Estimated: 45 mL/min — ABNORMAL LOW (ref >60)
Glucose, Bld: 219 mg/dL — ABNORMAL HIGH (ref 70–99)
Potassium: 3.8 mmol/L (ref 3.5–5.1)
Sodium: 133 mmol/L — ABNORMAL LOW (ref 135–145)

## 2020-06-20 LAB — HEPATITIS B SURFACE ANTIBODY, QUANTITATIVE: Hep B S AB Quant (Post): <3.1 m[IU]/mL — ABNORMAL LOW (ref >9.9)

## 2020-06-20 LAB — PHOSPHORUS: Phosphorus: 4.5 mg/dL (ref 2.5–4.6)

## 2020-06-20 LAB — MAGNESIUM: Magnesium: 2 mg/dL (ref 1.7–2.4)

## 2020-06-20 MED ORDER — FUROSEMIDE 10 MG/ML IJ SOLN: 40.0000 mg | Freq: Two times a day (BID) | INTRAMUSCULAR | Status: DC

## 2020-06-20 MED ORDER — SODIUM CHLORIDE 0.9% FLUSH
10.0000 mL | Freq: Two times a day (BID) | INTRAVENOUS | Status: DC
  Administered 2020-06-20 – 2020-06-21 (×2): 10 mL via INTRAVENOUS

## 2020-06-20 MED ORDER — LOSARTAN POTASSIUM 50 MG PO TABS
50.0000 mg | ORAL_TABLET | Freq: Every day | ORAL | Status: DC
  Administered 2020-06-21: 50 mg via ORAL
  Filled 2020-06-20: qty 1

## 2020-06-20 MED ORDER — FUROSEMIDE 10 MG/ML IJ SOLN
40.0000 mg | Freq: Once | INTRAMUSCULAR | Status: AC
  Administered 2020-06-21: 40 mg via INTRAVENOUS
  Filled 2020-06-20: qty 4

## 2020-06-20 NOTE — Plan of Care (Signed)
  Problem: Education: Goal: Knowledge of General Education information will improve Description: Including pain rating scale, medication(s)/side effects and non-pharmacologic comfort measures Outcome: Progressing   Problem: Health Behavior/Discharge Planning: Goal: Ability to manage health-related needs will improve Outcome: Progressing   Problem: Clinical Measurements: Goal: Ability to maintain clinical measurements within normal limits will improve Outcome: Progressing   Problem: Clinical Measurements: Goal: Respiratory complications will improve Outcome: Not Progressing

## 2020-06-20 NOTE — Progress Notes (Signed)
Progress Note  Patient Name: Karen [name] Date of Encounter: 06/20/2020  Primary Cardiologist: Ida Rogue, MD   Subjective   No CP. Breathing improved.  Inpatient Medications    Scheduled Meds: . albuterol  2.5 mg Nebulization Q6H  . apixaban  5 mg Oral BID  . atorvastatin  10 mg Oral Daily  . ferrous sulfate  325 mg Oral Daily  . fluticasone  2 spray Each Nare Daily  . fluticasone furoate-vilanterol  1 puff Inhalation Daily   And  . umeclidinium bromide  1 puff Inhalation Daily  . [START ON 06/21/2020] furosemide  40 mg Intravenous BID  . insulin aspart  0-5 Units Subcutaneous QHS  . insulin aspart  0-9 Units Subcutaneous TID WC  . insulin aspart  5 Units Subcutaneous TID WC  . insulin glargine  45 Units Subcutaneous QHS  . ipratropium  2 spray Each Nare QID  . losartan  100 mg Oral Daily  . mouth rinse  15 mL Mouth Rinse BID  . montelukast  10 mg Oral QHS  . pantoprazole  40 mg Oral Daily  . vitamin B-12  500 mcg Oral Daily   Continuous Infusions:  PRN Meds: acetaminophen **OR** acetaminophen, albuterol, magnesium hydroxide, ondansetron **OR** ondansetron (ZOFRAN) IV, traZODone   Vital Signs    Vitals:   06/20/20 0421 06/20/20 0436 06/20/20 0802 06/20/20 1128  BP:  (!) 96/54 (!) 113/49 (!) 118/55  Pulse:  78 87 76  Resp:  '18 18 18  '$ Temp:  98.4 F (36.9 C) 97.7 F (36.5 C) 97.7 F (36.5 C)  TempSrc:  Oral Oral Oral  SpO2:  93% 96% 98%  Weight: 97.7 kg     Height:        Intake/Output Summary (Last 24 hours) at 06/20/2020 1200 Last data filed at 06/20/2020 0100 Gross per 24 hour  Intake 600 ml  Output 501 ml  Net 99 ml   Last 3 Weights 06/20/2020 06/19/2020 06/17/2020  Weight (lbs) 215 lb 4.8 oz 214 lb 4.6 oz 210 lb  Weight (kg) 97.659 kg 97.2 kg 95.255 kg      Telemetry    NSR, 70-90s - Personally Reviewed  ECG    No new tracings - Personally Reviewed  Physical Exam   GEN: No acute distress.   Neck: 6 cm JVD Cardiac: RRR, no  murmurs, rubs, or gallops.  Respiratory: Clear to auscultation bilaterally. GI: Soft, nontender, non-distended  MS: No edema; No deformity. Neuro:  Nonfocal  Psych: Normal affect   Labs    High Sensitivity Troponin:   Recent Labs  Lab 05/21/20 2034 06/17/20 1808 06/17/20 2209  TROPONINIHS '12 11 11      '$ Chemistry Recent Labs  Lab 06/18/20 0653 06/19/20 0503 06/20/20 0510  NA 138 134* 133*  K 3.9 3.9 3.8  CL 96* 92* 89*  CO2 31 32 33*  GLUCOSE 93 266* 219*  BUN 16 19 30*  CREATININE 0.76 0.99 1.25*  CALCIUM 9.1 9.0 9.1  GFRNONAA >60 59* 45*  ANIONGAP '11 10 11     '$ Hematology Recent Labs  Lab 06/17/20 1808 06/18/20 0653 06/20/20 0510  WBC 10.3 10.0 9.0  RBC 3.44* 3.63* 3.90  HGB 10.0* 10.4* 11.1*  HCT 30.8* 32.4* 34.5*  MCV 89.5 89.3 88.5  MCH 29.1 28.7 28.5  MCHC 32.5 32.1 32.2  RDW 14.6 14.5 14.7  PLT 253 248 276    BNP Recent Labs  Lab 06/19/20 0941  BNP 55.6  DDimer No results for input(s): DDIMER in the last 168 hours.   Radiology    ECHOCARDIOGRAM COMPLETE  Result Date: 06/19/2020    ECHOCARDIOGRAM REPORT   Patient Name:   Karen [name] Date of Exam: 06/19/2020 Medical Rec #:  [id number]        Height:       63.0 in Accession #:    [id number]       Weight:       214.3 lb Date of Birth:  07-23-1945       BSA:          1.991 m Patient Age:    75 years         BP:           145/82 mmHg Patient Gender: F                HR:           73 bpm. Exam Location:  [facility] Procedure: 2D Echo Indications:     Aortic stenosis I35.0  History:         Patient has prior history of Echocardiogram examinations, most                  recent 09/28/2019.  Sonographer:     Kathlen Brunswick RDCS Referring Phys:  [id number] [place] Diagnosing Phys: Dorris Carnes MD IMPRESSIONS  1. Left ventricular ejection fraction, by estimation, is 60 to 65%. The left ventricle has normal function. There is mild left ventricular hypertrophy.  2. Right ventricular systolic function is  normal. The right ventricular size is normal.  3. Trivial mitral valve regurgitation.  4. AV is thickened, calcified Difficult to see well Peak and mean gradients through the vlave are 30 and 15 mm Hg respectively AVA (VTI) is 1.03 cm2. Dimensionless index is 0.44. All consistent with mild to moderate AS. Compared to echo report from September 28, 2019 no significant change . Aortic valve regurgitation is not visualized.  5. The inferior vena cava is normal in size with greater than 50% respiratory variability, suggesting right atrial pressure of 3 mmHg. FINDINGS  Left Ventricle: Left ventricular ejection fraction, by estimation, is 60 to 65%. The left ventricle has normal function. Global longitudinal strain performed but not reported based on interpreter judgement due to suboptimal tracking. The left ventricular internal cavity size was normal in size. There is mild left ventricular hypertrophy. Right Ventricle: The right ventricular size is normal. Right vetricular wall thickness was not assessed. Right ventricular systolic function is normal. Left Atrium: Left atrial size was normal in size. Right Atrium: Right atrial size was normal in size. Pericardium: There is no evidence of pericardial effusion. Mitral Valve: There is mild thickening of the mitral valve leaflet(s). Mild mitral annular calcification. Trivial mitral valve regurgitation. Tricuspid Valve: The tricuspid valve is normal in structure. Tricuspid valve regurgitation is trivial. Aortic Valve: AV is thickened, calcified Difficult to see well Peak and mean gradients through the vlave are 30 and 15 mm Hg respectively AVA (VTI) is 1.03 cm2. Dimensionless index is 0.44. All consistent with mild to moderate AS. Compared to echo report  from September 28, 2019 no significant change. Aortic valve regurgitation is not visualized. Aortic valve mean gradient measures 14.0 mmHg. Aortic valve peak gradient measures 26.9 mmHg. Aortic valve area, by VTI measures 1.12 cm.  Pulmonic Valve: The pulmonic valve was not well visualized. Pulmonic valve regurgitation is not visualized. Aorta: The aortic root is normal in  size and structure. Venous: The inferior vena cava is normal in size with greater than 50% respiratory variability, suggesting right atrial pressure of 3 mmHg. IAS/Shunts: No atrial level shunt detected by color flow Doppler.  LEFT VENTRICLE PLAX 2D LVIDd:         4.56 cm  Diastology LVIDs:         3.05 cm  LV e' medial:    5.44 cm/s LV PW:         1.23 cm  LV E/e' medial:  15.2 LV IVS:        1.32 cm  LV e' lateral:   7.07 cm/s LVOT diam:     1.80 cm  LV E/e' lateral: 11.7 LV SV:         61 LV SV Index:   31 LVOT Area:     2.54 cm  RIGHT VENTRICLE RV Basal diam:  3.35 cm RV S prime:     14.30 cm/s TAPSE (M-mode): 3.0 cm LEFT ATRIUM             Index       RIGHT ATRIUM           Index LA diam:        3.80 cm 1.91 cm/m  RA Area:     14.00 cm LA Vol (A2C):   49.0 ml 24.61 ml/m RA Volume:   29.80 ml  14.97 ml/m LA Vol (A4C):   35.2 ml 17.68 ml/m LA Biplane Vol: 42.2 ml 21.19 ml/m  AORTIC VALVE AV Area (Vmax):    1.09 cm AV Area (Vmean):   1.06 cm AV Area (VTI):     1.12 cm AV Vmax:           259.20 cm/s AV Vmean:          170.800 cm/s AV VTI:            0.541 m AV Peak Grad:      26.9 mmHg AV Mean Grad:      14.0 mmHg LVOT Vmax:         111.00 cm/s LVOT Vmean:        71.300 cm/s LVOT VTI:          0.239 m LVOT/AV VTI ratio: 0.44  AORTA Ao Root diam: 2.40 cm MITRAL VALVE                TRICUSPID VALVE MV Area (PHT): 2.92 cm     TV Peak grad:   29.4 mmHg MV Decel Time: 260 msec     TV Vmax:        2.71 m/s MV E velocity: 82.70 cm/s MV A velocity: 110.00 cm/s  SHUNTS MV E/A ratio:  0.75         Systemic VTI:  0.24 m                             Systemic Diam: 1.80 cm Dorris Carnes MD Electronically signed by Dorris Carnes MD Signature Date/Time: 06/19/2020/2:31:40 PM    Final     Cardiac Studies   Echo 09/28/2019 1. Left ventricular ejection fraction, by estimation, is  55 to 60%. The  left ventricle has normal function. The left ventricle has no regional  wall motion abnormalities. There is mild left ventricular hypertrophy.  Left ventricular diastolic parameters  are consistent with Grade I diastolic dysfunction (impaired relaxation).  2. Right ventricular systolic function is normal. The right  ventricular  size is normal. There is severely elevated pulmonary artery systolic  pressure. The estimated right ventricular systolic pressure is [id number] mmHg.  3. Left atrial size was mildly dilated.  4. The mitral valve is normal in structure. Trivial mitral valve  regurgitation. No evidence of mitral stenosis.  5. The aortic valve is abnormal. Aortic valve regurgitation is not  visualized. Moderate aortic valve stenosis. Aortic valve area, by VTI  measures 0.94 cm. Aortic valve mean gradient measures 17.3 mmHg.   Cardiac monitoring 02/27/20 Normal sinus rhythm with paroxysmal atrial fibrillation avg HR of 89 bpm.  1 run of Ventricular Tachycardia occurred lasting 4 beats with a max rate of 179 bpm (avg 161 bpm).  Atrial Fibrillation occurred (<1% burden), ranging from 75-179 bpm (avg of 117 bpm), the longest lasting 32 mins 50 secs with an avg rate of 119 bpm.  Isolated SVEs were occasional (1.4%, 25270), SVE Couplets were rare (<1.0%, 2710), and SVE Triplets were rare (<1.0%, 299). Isolated VEs were rare (<1.0%, 617), VE Couplets were rare (<1.0%, 4), and VE Triplets were rare (<1.0%, 1). Ventricular Bigeminy was present.  Patient Profile     75 y.o. female with a hx of HFpEF, severe pulmonary hypertension with RVSP 63.15mHg, moderate aortic stenosis, junctional bradycardia with BB/CCB, PAF by 03/2019 monitor, CKD 3, GI bleed, DM2, hypertension, hyperlipidemia, chronic hypoxic respiratory failure secondary to COPD on home oxygen, anemia, obesity, and who is being seen today for the evaluation of acute on chronic HFpEF.   Assessment & Plan    Acute on  chronic heart failure with preserved ejection fraction G1DD, pulmonary hypertension, moderate aortic stenosis Chronic hypoxic respiratory failure/COPD --Improved breathing with diuresis. SOB likely multifactorial in the setting of known COPD (3 L [place] O2 at baseline), HFpEF, PAF, and moderate AS.  --Echo EF 60-65%, mild LVH, mild to moderate AS.   She can be discharged home on lasix 40 mg bid ? Daily BMET. Monitor Cr, electrolytes. As above, Cr bump overnight.  Cr 0.99  1.25, BUN 19  30. ? Monitor I's/O's.   Net +369cc yesterday, net output for admission -1.9L ? Daily weights.   97.2kg  97.7kg.  Hold PTA HCTZ to prevent electrolyte abnormalities.   If Cr continues to bump, recommend reduced dose or holding losartan 100 mg daily.   [place]F [place]-- Currently NSR.  History of PAF on anticoagulation.   Continue to monitor on telemetry, given report of racing HR.   --BMET. Monitor electrolytes.  As above. --TSH 2.339 -- Not on any rate controlling medications given her history of junctional bradycardia on  BB/CCB. --Continue [place] daily CBC, given history of GI bleed.  CHA2DS2VASc score of at least  6.  Sinus pause on telemetry History of BB induced  junctional bradycardia -- Continue to monitor on telemetry.  As above, not on AV nodal blocking agents due to history of junctional bradycardia.  Continue to avoid BB/CCB. TSH wnl. Continue to monitor electrolytes.   Essential hypertension --BP soft to hypotensive.  Holding IV Lasix today  If Cr still elevated on recheck, recommend reduced dosing of losartan.  Hold PTA HCTZ.  HLD -- Continue statin.  LDL goal below 70.  Anemia -- Chronic with baseline hemoglobin 10.0-11.  Continue to monitor with daily CBC.  DM2 -- SSI, per IM  For questions or updates, please contact [place] consult [email or web address] for contact info under   Signed, JArvil Chaco PA-C  06/20/2020, 12:00 PM  Cardiology  Attending  Patient seen and examined. Agree with the findings as noted above. The patient is back to her baseline. I think she would be an acceptable dc candidate with followup early with Dr. Rockey Situ in the coming week or two. Lasix  40 mg bid along with her other cardiac meds but not HCTZ would be recommended.  Carleene Overlie Izear Pine,MD

## 2020-06-20 NOTE — Progress Notes (Signed)
[facility] Progress Note  Patient: Karen [name]    [id number]  DOA: 06/17/2020     Date of Service: the patient was seen and examined on 06/20/2020  Chief Complaint  Patient presents with  . Shortness of Breath   Brief hospital course: Brief history.  Patient admitted 06/17/2020 in the evening with acute on chronic diastolic congestive heart failure.  Patient has chronic hypoxic respiratory failure on 3 L.  Other history includes essential hypertension, hyperlipidemia, paroxysmal atrial fibrillation.  Patient was started on IV Lasix.  Assessment and Plan:  # Acute diastolic congestive heart failure, moderate aortic stenosis on last echocardiogram.   S/p IV Lasix 40 mg IV twice daily.   BNP actually on the lower side.  Not quite sure what to make of this.   Patient not on beta-blocker secondary to junctional bradycardia when taking this class of medications. BNP 55 within normal range TTE shows LVEF 6065%, mild LV hypertrophy. Cr 1.25 elevated today, skip dose of Lasix today, recheck creatinine level tomorrow and start Lasix accordingly.  Currently patient is not volume overload   # Acute on chronic hypoxic respiratory failure.  The patient did have a pulse ox of 85% early this morning.  Unclear if that was on oxygen or off oxygen.  Patient was on 4 L of oxygen when I saw her and dialed her down to her normal 3 L.  added inhaler and nebulizer treatments in case COPD playing a role with her trouble breathing.  Holding off on steroids at this point.  Covid negative.  # Hypertension,  BP soft, so decreased dose of Losartan from 100 to 50 mg pod Monitor BP  # Type 2 diabetes mellitus with hyperlipidemia on glargine insulin and sliding scale insulin.  Also on atorvastatin.  Hemoglobin last month up at 11.6.  #Paroxysmal atrial fibrillation on Eliquis for anticoagulation.  Not on medications for rate control. TSH 2.3 within normal range  #Weakness.  Physical therapy  evaluation  #Obesity with a BMI of 37.20  # GERD on Protonix  # CT scan showing enlargement of the left lobe of the liver.  Will check hepatitis profiles.  No ascites seen on CT scan  # Iron deficiency, iron saturation 10% continue  oral supplement   Body mass index is 37.96 kg/m.   Interventions:        Diet: Heart healthy/carb modified DVT Prophylaxis: Therapeutic Anticoagulation with Eliquis   Advance goals of care discussion: Full code  Family Communication: family was NOT present at bedside, at the time of interview.  The pt provided permission to discuss medical plan with the family. Opportunity was given to ask question and all questions were answered satisfactorily.   Disposition:  Pt is from Home, admitted with CHF exacerbation, which is improving, creatinine bumped up today so we will recheck creatinine tomorrow, most likely discharge home tomorrow as she is cleared by cardiology as well.     Subjective: No significant overnight issues, patient was admitted due to volume overload and shortness of breath, patient feels provement but is still she is feeling tired and mild shortness of breath, shaking has been resolved Patient denied any chest pain or palpitations.   Physical Exam: General:  alert oriented to time, place, and person.  Appear in mild distress, affect appropriate Eyes: PERRLA ENT: Oral Mucosa Clear, moist  Neck: no JVD,  Cardiovascular: S1 and S2 Present, no Murmur,  Respiratory: good respiratory effort, Bilateral Air entry equal and Decreased, mild  Crackles,  no wheezes Abdomen: Bowel Sound present, Soft and no tenderness,  Skin: no rashes Extremities: mild Pedal edema, no calf tenderness Neurologic: without any new focal findings Gait not checked due to patient safety concerns  Vitals:   06/20/20 0436 06/20/20 0802 06/20/20 1128 06/20/20 1500  BP: (!) 96/54 (!) 113/49 (!) 118/55 (!) 124/54  Pulse: 78 87 76 82  Resp: '18 18 18 18  '$ Temp: 98.4  F (36.9 C) 97.7 F (36.5 C) 97.7 F (36.5 C) 99 F (37.2 C)  TempSrc: Oral Oral Oral Tympanic  SpO2: 93% 96% 98% 98%  Weight:      Height:        Intake/Output Summary (Last 24 hours) at 06/20/2020 1650 Last data filed at 06/20/2020 0100 Gross per 24 hour  Intake 360 ml  Output 501 ml  Net -141 ml   Filed Weights   06/17/20 1802 06/19/20 0500 06/20/20 0421  Weight: 95.3 kg 97.2 kg 97.7 kg    Data Reviewed: I have personally reviewed and interpreted daily labs, tele strips, imagings as discussed above. I reviewed all nursing notes, pharmacy notes, vitals, pertinent old records I have discussed plan of care as described above with RN and patient/family.  CBC: Recent Labs  Lab 06/17/20 1808 06/18/20 0653 06/20/20 0510  WBC 10.3 10.0 9.0  HGB 10.0* 10.4* 11.1*  HCT 30.8* 32.4* 34.5*  MCV 89.5 89.3 88.5  PLT 253 248 [id number]   Basic Metabolic Panel: Recent Labs  Lab 06/17/20 1808 06/18/20 0653 06/19/20 0503 06/19/20 0941 06/20/20 0510  NA 134* 138 134*  --  133*  K 4.6 3.9 3.9  --  3.8  CL 99 96* 92*  --  89*  CO2 27 31 32  --  33*  GLUCOSE 181* 93 266*  --  219*  BUN '18 16 19  '$ --  30*  CREATININE 0.86 0.76 0.99  --  1.25*  CALCIUM 9.1 9.1 9.0  --  9.1  MG  --   --   --  1.7 2.0  PHOS  --   --   --  3.8 4.5    Studies: No results found.  Scheduled Meds: . albuterol  2.5 mg Nebulization Q6H  . apixaban  5 mg Oral BID  . atorvastatin  10 mg Oral Daily  . ferrous sulfate  325 mg Oral Daily  . fluticasone  2 spray Each Nare Daily  . fluticasone furoate-vilanterol  1 puff Inhalation Daily   And  . umeclidinium bromide  1 puff Inhalation Daily  . [START ON 06/21/2020] furosemide  40 mg Intravenous Once  . insulin aspart  0-5 Units Subcutaneous QHS  . insulin aspart  0-9 Units Subcutaneous TID WC  . insulin aspart  5 Units Subcutaneous TID WC  . insulin glargine  45 Units Subcutaneous QHS  . ipratropium  2 spray Each Nare QID  . losartan  100 mg Oral Daily   . mouth rinse  15 mL Mouth Rinse BID  . montelukast  10 mg Oral QHS  . pantoprazole  40 mg Oral Daily  . vitamin B-12  500 mcg Oral Daily   Continuous Infusions: PRN Meds: acetaminophen **OR** acetaminophen, albuterol, magnesium hydroxide, ondansetron **OR** ondansetron (ZOFRAN) IV, traZODone  Time spent: 35 minutes  Author: Val Riles. MD [facility] 06/20/2020 4:50 PM  To reach On-call, see care teams to locate the attending and reach out to them via [email or web address]. If 7PM-7AM, please contact night-coverage If you still have difficulty reaching the attending  provider, please page the [facility] (Director on Call) for [facility] on amion for assistance.

## 2020-06-20 NOTE — TOC Progression Note (Signed)
Transition of Care [facility]) - Progression Note    Patient Details  Name: Karen [name] MRN: [id number] Date of Birth: September 15, 1945  Transition of Care [facility]) CM/SW Contact  Izola Price, RN Phone Number: 06/20/2020, 12:18 PM  Clinical Narrative:  [facility] rec. so far by PT evaluation. TOC to continue to monitor for further discharge planning needs as patient condition evolves. Simmie Davies RN CM        Expected Discharge Plan and Services                                                 Social Determinants of Health (SDOH) Interventions    Readmission Risk Interventions Readmission Risk Prevention Plan 09/30/2019 02/26/2019 09/17/2018  Transportation Screening Complete Complete Complete  PCP or Specialist Appt within 5-7 Days - - -  PCP or Specialist Appt within 3-5 Days Complete - -  Home Care Screening - - -  Medication Review (RN CM) - - -  Social Work Scientific laboratory technician for [place] Planning/Counseling Complete Complete -  Palliative Care Screening Not Applicable Not Applicable -  Medication Review (RN Care Manager) Complete Referral to Pharmacy Complete  PCP or Specialist appointment within 3-5 days of discharge - - Not Complete  PCP/Specialist Appt Not Complete comments - - PCP appointment 7/20  [place] or Home Care Consult - - Complete  [place] - - Not Applicable  Some recent data might be hidden

## 2020-06-21 LAB — CBC
HCT: 34 % — ABNORMAL LOW (ref 36.0–46.0)
Hemoglobin: 10.9 g/dL — ABNORMAL LOW (ref 12.0–15.0)
MCH: 28.3 pg (ref 26.0–34.0)
MCHC: 32.1 g/dL (ref 30.0–36.0)
MCV: 88.3 fL (ref 80.0–100.0)
Platelets: 279 10*3/uL (ref 150–400)
RBC: 3.85 MIL/uL — ABNORMAL LOW (ref 3.87–5.11)
RDW: 14.6 % (ref 11.5–15.5)
WBC: 10.8 10*3/uL — ABNORMAL HIGH (ref 4.0–10.5)
nRBC: 0 % (ref 0.0–0.2)

## 2020-06-21 LAB — BASIC METABOLIC PANEL
Anion gap: 10 (ref 5–15)
BUN: 30 mg/dL — ABNORMAL HIGH (ref 8–23)
CO2: 31 mmol/L (ref 22–32)
Calcium: 9.1 mg/dL (ref 8.9–10.3)
Chloride: 91 mmol/L — ABNORMAL LOW (ref 98–111)
Creatinine, Ser: 1.25 mg/dL — ABNORMAL HIGH (ref 0.44–1.00)
GFR, Estimated: 45 mL/min — ABNORMAL LOW (ref >60)
Glucose, Bld: 255 mg/dL — ABNORMAL HIGH (ref 70–99)
Potassium: 4 mmol/L (ref 3.5–5.1)
Sodium: 132 mmol/L — ABNORMAL LOW (ref 135–145)

## 2020-06-21 LAB — TROPONIN I (HIGH SENSITIVITY): Troponin I (High Sensitivity): 7 ng/L (ref <18)

## 2020-06-21 MED ORDER — NITROGLYCERIN 0.4 MG SL SUBL
0.4000 mg | SUBLINGUAL_TABLET | SUBLINGUAL | Status: DC | PRN
  Filled 2020-06-21 (×2): qty 1

## 2020-06-21 MED ORDER — FUROSEMIDE 40 MG PO TABS: 40.0000 mg | ORAL_TABLET | Freq: Every day | ORAL | 11 refills | Status: DC

## 2020-06-21 MED ORDER — LOSARTAN POTASSIUM 50 MG PO TABS: 50.0000 mg | ORAL_TABLET | Freq: Every day | ORAL | 2 refills | Status: DC

## 2020-06-21 NOTE — TOC Initial Note (Addendum)
Transition of Care [facility]) - Initial/Assessment Note    Patient Details  Name: Karen [name] MRN: [id number] Date of Birth: 03-Nov-1945  Transition of Care [facility]) CM/SW Contact:    Kerin Salen, RN Phone Number: 06/21/2020, 3:09 PM  Clinical Narrative:  Spoke with patient, who is alert and oriented x3, says she lives with Husband and Son. Husband drives her to appointments, assist with shopping and does most of the cooking. Use [facility] pharmacy in [place], husband and Son picks up medications when ready. Patient states she uses rollator and Oxygen 2l continuously. Patient says she does not need anything else, her bedroom has a bathroom near by and family is supportive. Will continue to track for discharge needs.     [place] will provide PT/RN services for patient.           Expected Discharge Plan: [place] Barriers to Discharge: Continued Medical Work up   Patient Goals and CMS Choice Patient states their goals for this hospitalization and ongoing recovery are:: To return home with husband and Son.   Choice offered to / list presented to : NA  Expected Discharge Plan and Services Expected Discharge Plan: [place] In-house Referral: Clinical Social Work   Post Acute Care Choice: [place] arrangements for the past 2 months: [place] Expected Discharge Date: 06/21/20               DME Arranged: N/A DME Agency: NA       HH Arranged: PT,RN [place]: [place] ([facility]) Date HH Agency Contacted: 06/20/20 Time HH Agency Contacted: 1100 Representative spoke with at [place]: [place] Arrangements/Services Living arrangements for the past 2 months: [place] with:: [place] Patient language and need for interpreter reviewed:: Yes Do you feel safe going back to the place where you live?: Yes      Need for Family Participation in Patient Care: Yes  (Comment) Care giver support system in place?: Yes (comment)   Criminal Activity/Legal Involvement Pertinent to Current Situation/Hospitalization: No - Comment as needed  Activities of Daily Living Home Assistive Devices/Equipment: Walker (specify type) ADL Screening (condition at time of admission) Patient's cognitive ability adequate to safely complete daily activities?: Yes Is the patient deaf or have difficulty hearing?: No Does the patient have difficulty seeing, even when wearing glasses/contacts?: No Does the patient have difficulty concentrating, remembering, or making decisions?: No Patient able to express need for assistance with ADLs?: Yes Does the patient have difficulty dressing or bathing?: No Independently performs ADLs?: Yes (appropriate for developmental age) Does the patient have difficulty walking or climbing stairs?: Yes Weakness of Legs: Both Weakness of Arms/Hands: None  Permission Sought/Granted                  Emotional Assessment Appearance:: Appears stated age Attitude/Demeanor/Rapport: Engaged Affect (typically observed): Guarded Orientation: : Oriented to Self,Oriented to Place,Oriented to  Time,Oriented to Situation Alcohol / Substance Use: Not Applicable Psych Involvement: No (comment)  Admission diagnosis:  Shortness of breath [R06.02] Acute CHF (congestive heart failure) ([place]) [I50.9] Acute on chronic congestive heart failure, unspecified heart failure type [facility]) [I50.9] Patient Active Problem List   Diagnosis Date Noted  . Aortic stenosis, moderate   . AF (paroxysmal atrial fibrillation) ([place])   . Gastroesophageal reflux disease without esophagitis   . Acute CHF (congestive heart failure) ([place]) 06/17/2020  . Chronic respiratory failure with hypercapnia (  [place]) 05/21/2020  . Severe sepsis ([place]) 05/21/2020  . UTI (urinary tract infection) 05/21/2020  . Hyperglycemia due to type 2 diabetes mellitus ([place]) 05/21/2020  . Abnormal CT of liver  05/21/2020  . 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 05/21/2020  . Morbid obesity ([place]) 05/08/2020  . Acute hip pain, left 10/23/2019  . Lumbar stenosis with neurogenic claudication 10/23/2019  . COPD with acute exacerbation ([place]) 09/28/2019  . Acute diastolic CHF (congestive heart failure) ([place]) 09/28/2019  . (HFpEF) heart failure with preserved ejection fraction ([place]) 09/27/2019  . Rectal bleeding 06/28/2019  . Hypokalemia 06/28/2019  . Hyponatremia 06/28/2019  . Type 2 diabetes mellitus with hyperlipidemia ([place]) 06/28/2019  . CKD (chronic kidney disease), stage IIIa 06/28/2019  . Atrial fibrillation, chronic ([place]) 06/28/2019  . Pulmonary edema 02/27/2019  . Bradycardia 02/24/2019  . Chronic respiratory failure with hypoxia ([place]) 02/23/2019  . Atypical chest pain 02/13/2019  . Osteopenia of neck of left femur 11/20/2018  . AVM (arteriovenous malformation) of small bowel, acquired   . Acute gastric ulcer with hemorrhage   . Chronic diastolic heart failure ([place]) 08/22/2018  . Diarrhea 08/22/2018  . Junctional bradycardia   . Acute on chronic heart failure with preserved ejection fraction (HFpEF) ([place])   . AKI (acute kidney injury) ([place])   . Symptomatic bradycardia 08/05/2018  . Acute on chronic respiratory failure ([place]) 06/25/2018  . 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 diabetes mellitus ([place]) 01/25/2018  . History of non anemic vitamin B12 deficiency 01/25/2018  . Personal history of kidney stones 11/12/2017  . Urge incontinence 11/12/2017  . History of leukocytosis 09/17/2017  . Right ureteral stone 09/15/2017  . Acute GI bleeding   . GI bleed 08/26/2017  . Arthritis 08/10/2017  . Stage 4 chronic kidney disease ([place]) 08/10/2017  . COPD (chronic obstructive pulmonary disease) ([place]) 08/10/2017  . Diabetes mellitus type 2, uncomplicated ([place]) 0000000  . Hypertension 08/10/2017  . Obesity (BMI 35.0-39.9 without comorbidity) 04/11/2017  . Primary  osteoarthritis of right knee 09/01/2016  . Leucocytosis 10/19/2015  . 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 Acute on chronic respiratory failure with hypoxia and hypercapnia ([place]) 01/07/2015  . Sciatica 01/07/2015  . Weakness 01/07/2015  . 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 Iron deficiency anemia 06/22/2014  . Microalbuminuria 06/22/2014  . CHF (congestive heart failure) ([place]) 04/20/2014  . Edema, peripheral 04/20/2014   PCP:  Earlie Counts, FNP Pharmacy:   [facility] [address] [place] [address] Phone: [phone] Fax: [phone]     Social Determinants of Health (SDOH) Interventions    Readmission Risk Interventions Readmission Risk Prevention Plan 06/21/2020 09/30/2019 02/26/2019  Transportation Screening Complete Complete Complete  PCP or Specialist Appt within 5-7 Days - - -  PCP or Specialist Appt within 3-5 Days - Complete -  Home Care Screening - - -  Medication Review (RN CM) - - -  Social Work Consult for [place] Planning/Counseling - Complete Complete  Palliative Care Screening - Not Applicable Not Applicable  Medication Review Press photographer) Complete Complete Referral to Pharmacy  PCP or Specialist appointment within 3-5 days of discharge Complete - -  PCP/Specialist Appt Not Complete comments - - -  [place] or Home Care Consult Complete - -  SW Recovery Care/Counseling Consult Complete - -  Palliative Care Screening Not Applicable - -  [place] Not Applicable - -  Some recent  data might be hidden

## 2020-06-21 NOTE — Progress Notes (Signed)
   Heart Failure Nurse Navigator Note  HFpEF 55 to 60%.  Moderate aortic stenosis.  Met with patient today, she states that she still does not feel like herself.  Also complained of midsternal dull ache that she states has been there since last evening, when questioned she states that she had not made the nursing staff aware.  Upon further questioning she states that she has had this discomfort at home and when I asked her what she did for this she said nothing.  Did make her nurse Juliet aware, the pain was reproducible with palpation.  At home she follows with para medicine.  She states prior to admission she had not noted a weight gain.   Pricilla Riffle RN CHFN

## 2020-06-21 NOTE — Progress Notes (Signed)
Progress Note  Patient Name: Karen [name] Date of Encounter: 06/21/2020  Primary Cardiologist: Rockey Situ  Subjective   Reports she continues to have unchanged chest pain that has been present throughout her admission. Chest pain got worse overnight and was rated an 8/10, substernal, did not radiate, and was not associated with symptoms. Chest pain is currently 2/10. She did not notify staff of her pain. Documented UOP 1.3 L for the past 24 hours with a net - 3.1 L for the admission. Weight 97.7-->94.4 kg over the past 24 hours. Renal function stable.   Inpatient Medications    Scheduled Meds: . albuterol  2.5 mg Nebulization Q6H  . apixaban  5 mg Oral BID  . atorvastatin  10 mg Oral Daily  . ferrous sulfate  325 mg Oral Daily  . fluticasone  2 spray Each Nare Daily  . fluticasone furoate-vilanterol  1 puff Inhalation Daily   And  . umeclidinium bromide  1 puff Inhalation Daily  . insulin aspart  0-5 Units Subcutaneous QHS  . insulin aspart  0-9 Units Subcutaneous TID WC  . insulin aspart  5 Units Subcutaneous TID WC  . insulin glargine  45 Units Subcutaneous QHS  . ipratropium  2 spray Each Nare QID  . losartan  50 mg Oral Daily  . mouth rinse  15 mL Mouth Rinse BID  . montelukast  10 mg Oral QHS  . pantoprazole  40 mg Oral Daily  . sodium chloride flush  10 mL Intravenous Q12H  . vitamin B-12  500 mcg Oral Daily   Continuous Infusions:  PRN Meds: acetaminophen **OR** acetaminophen, albuterol, magnesium hydroxide, ondansetron **OR** ondansetron (ZOFRAN) IV, traZODone   Vital Signs    Vitals:   06/21/20 0001 06/21/20 0105 06/21/20 0741 06/21/20 0801  BP: (!) 124/54   115/76  Pulse: 81   80  Resp: 19   16  Temp: 98.5 F (36.9 C)   97.8 F (36.6 C)  TempSrc: Oral   Oral  SpO2: 96%  94% 94%  Weight:  94.4 kg    Height:        Intake/Output Summary (Last 24 hours) at 06/21/2020 0942 Last data filed at 06/21/2020 0207 Gross per 24 hour  Intake --  Output 1300  ml  Net -1300 ml   Filed Weights   06/19/20 0500 06/20/20 0421 06/21/20 0105  Weight: 97.2 kg 97.7 kg 94.4 kg    Telemetry    SR with longest pause of 1.4 seconds - Personally Reviewed  ECG    No new tracings - Personally Reviewed  Physical Exam   GEN: No acute distress.   Neck: No JVD. Cardiac: RRR, no murmurs, rubs, or gallops.  Respiratory: Mildly diminished.  GI: Soft, nontender, non-distended.   MS: No edema; No deformity. Neuro:  Alert and oriented x 3; Nonfocal.  Psych: Normal affect.  Labs    Chemistry Recent Labs  Lab 06/19/20 0503 06/20/20 0510 06/21/20 0542  NA 134* 133* 132*  K 3.9 3.8 4.0  CL 92* 89* 91*  CO2 32 33* 31  GLUCOSE 266* 219* 255*  BUN 19 30* 30*  CREATININE 0.99 1.25* 1.25*  CALCIUM 9.0 9.1 9.1  GFRNONAA 59* 45* 45*  ANIONGAP '10 11 10     '$ Hematology Recent Labs  Lab 06/18/20 0653 06/20/20 0510 06/21/20 0542  WBC 10.0 9.0 10.8*  RBC 3.63* 3.90 3.85*  HGB 10.4* 11.1* 10.9*  HCT 32.4* 34.5* 34.0*  MCV 89.3 88.5 88.3  MCH 28.7 28.5 28.3  MCHC 32.1 32.2 32.1  RDW 14.5 14.7 14.6  PLT 248 276 279    Cardiac EnzymesNo results for input(s): TROPONINI in the last 168 hours. No results for input(s): TROPIPOC in the last 168 hours.   BNP Recent Labs  Lab 06/19/20 0941  BNP 55.6     DDimer No results for input(s): DDIMER in the last 168 hours.   Radiology    No results found.  Cardiac Studies   2D echo 09/28/2019: 1. Left ventricular ejection fraction, by estimation, is 55 to 60%. The  left ventricle has normal function. The left ventricle has no regional  wall motion abnormalities. There is mild left ventricular hypertrophy.  Left ventricular diastolic parameters  are consistent with Grade I diastolic dysfunction (impaired relaxation).  2. Right ventricular systolic function is normal. The right ventricular  size is normal. There is severely elevated pulmonary artery systolic  pressure. The estimated right  ventricular systolic pressure is [id number] mmHg.  3. Left atrial size was mildly dilated.  4. The mitral valve is normal in structure. Trivial mitral valve  regurgitation. No evidence of mitral stenosis.  5. The aortic valve is abnormal. Aortic valve regurgitation is not  visualized. Moderate aortic valve stenosis. Aortic valve area, by VTI  measures 0.94 cm. Aortic valve mean gradient measures 17.3 mmHg.  __________  Cardiac monitoring 02/2020: Normal sinus rhythm with paroxysmal atrial fibrillation avg HR of 89 bpm.  1 run of Ventricular Tachycardia occurred lasting 4 beats with a max rate of 179 bpm (avg 161 bpm).  Atrial Fibrillation occurred (<1% burden), ranging from 75-179 bpm (avg of 117 bpm), the longest lasting 32 mins 50 secs with an avg rate of 119 bpm.  Isolated SVEs were occasional (1.4%, 25270), SVE Couplets were rare (<1.0%, 2710), and SVE Triplets were rare (<1.0%, 299). Isolated VEs were rare (<1.0%, 617), VE Couplets were rare (<1.0%, 4), and VE Triplets were rare (<1.0%, 1). Ventricular Bigeminy was present. __________  2D echo 06/19/2020: 1. Left ventricular ejection fraction, by estimation, is 60 to 65%. The  left ventricle has normal function. There is mild left ventricular  hypertrophy.  2. Right ventricular systolic function is normal. The right ventricular  size is normal.  3. Trivial mitral valve regurgitation.  4. AV is thickened, calcified Difficult to see well Peak and mean  gradients through the vlave are 30 and 15 mm Hg respectively AVA (VTI) is  1.03 cm2. Dimensionless index is 0.44. All consistent with mild to  moderate AS. Compared to echo report from September 28, 2019 no significant change . Aortic valve regurgitation is not  visualized.  5. The inferior vena cava is normal in size with greater than 50%  respiratory variability, suggesting right atrial pressure of 3 mmHg.   Patient Profile     75 y.o. female with history of HFpEF, severe  pulmonary hypertension, moderate aortic stenosis, junctional bradycardiawith BB/CCB,PAF by 03/2019 monitor,renal dysfunction, GI bleed, DM2, HTN, HLD, chronic hypoxic respiratory failure secondary to COPD on home oxygen, anemia, obesity, andwho is being seen today for the evaluation ofacute on chronic HFpEF.  Assessment & Plan    1. Chest pain: -Reports this has been present throughout her admission and acutely got worse overnight as outlined above -Check EKG and high-sensitivity troponin  -Initial troponin negative x 2 this admission  -SL NTG prn  2. Acute on chronic hypoxic respiratory failure: -Likely largely in the setting of volume overload with pulmonary hypertension, with symptoms  improving with diuresis  -Cannot exclude a component of COPD exacerbation, PAF, aortic stenosis, anemia, obesity and physical deconditioning  -Supplemental oxygen and nebs/inhalers   3. Acute on chronic HFpEF with pulmonary hypertension: -Volume status has improved -IV Lasix has been held with bump in renal function over the past 24-48 hours -Continue to hold HCTZ -Place on oral Lasix when renal function allows  -CHF education -Daily weights -Strict I/O  4. Moderate aortic stenosis: -Stable on echo this admission  -Outpatient follow up  5. PAF: -Currently, maintaining sinus rhythm  -Not on/requiring AV nodal blocking medications as outlined below -CHADS2VASc 6 -Eliquis  -TSH normal  6. History of junctional bradycardia in the context of beta blocker calcium channel blocker use: -No significant arrhythmias or pauses on tele  7. AKI: -She appears to have some degree of renal dysfunction with SCr ranging from around 0.8 to 1.8 at times -Renal function did bump over the past 24-48 hours, though remains stable this morning  8. HTN: -Blood pressure is well controlled -Losartan   9. HLD: -Lipitor   For questions or updates, please contact [place] Please consult [email or web address]  for contact info under Cardiology/STEMI.    Signed, Christell Faith, PA-C [place] Pager: [phone] 06/21/2020, 9:42 AM

## 2020-06-21 NOTE — Plan of Care (Signed)

## 2020-06-21 NOTE — Progress Notes (Signed)
    Stat EKG nonischemic.  Repeat troponin normal.  Symptoms appear to be noncardiac in etiology.  Advised staff to notify internal medicine.

## 2020-06-21 NOTE — Progress Notes (Addendum)
Physical Therapy Treatment Patient Details Name: Karen [name] MRN: [id number] DOB: [birth year] Today's Date: 06/21/2020    History of Present Illness Pt is a 75 y/o F admitted from home on 06/17/20 with c/c of SOB & worsening BLE edema. Pt being treated for acute on chronic diastolic CHF with subsequent acute on chronic hypoxic respiratory failure. PMH: heart failure with preserved ejection fraction of 55-60% with grade 1 diastolic dysfunction, HTN, dyslipidemia, COPD, chronic respiratory failure on home O2 at 3L/min, stage 3 CKD, DM2, asthma, cataract, morbid obesity, R knee OA, siatica    PT Comments    Pt seen for PT tx after being cleared for participation by MD. Pt reports "my breathing is not right" and she does not feel it's improving. Pt on 3L/min via nasal cannula throughout session & SpO2 >90%, HR 80 bpm at rest, increasing to 101 bpm with gait. Pt is able to ambulate with RW & distant supervision with PT only managing O2 tank (pt reports she manages tubing at home). Pt demonstrates heavy reliance on RW & forward trunk lean, with very slow gait speed. Will continue to see pt while in acute setting to progress gait distances & activity tolerance, as well as standing balance.     Follow Up Recommendations  Home health PT;Supervision for mobility/OOB     Equipment Recommendations  None recommended by PT    Recommendations for Other Services       Precautions / Restrictions Precautions Precautions: Fall Restrictions Weight Bearing Restrictions: No    Mobility  Bed Mobility Overal bed mobility: Modified Independent             General bed mobility comments: supine>sit with mod I with use of bed rails    Transfers Overall transfer level: Modified independent Equipment used: Rolling walker (2 wheeled) Transfers: Sit to/from Stand Sit to Stand: Modified independent (Device/Increase time)            Ambulation/Gait Ambulation/Gait assistance: Supervision Gait  Distance (Feet): 75 Feet Assistive device: Rolling walker (2 wheeled) Gait Pattern/deviations: Decreased step length - right;Decreased step length - left;Decreased dorsiflexion - left;Decreased dorsiflexion - right;Decreased stride length;Trunk flexed Gait velocity: significantly decreased   General Gait Details: forward trunk lean on to RW, very minimal BLE heel strike   Stairs             Wheelchair Mobility    Modified Rankin (Stroke Patients Only)       Balance Overall balance assessment: Needs assistance Sitting-balance support: Feet supported;No upper extremity supported Sitting balance-Leahy Scale: Good Sitting balance - Comments: sitting on toilet without LOB   Standing balance support: Bilateral upper extremity supported Standing balance-Leahy Scale: Fair Standing balance comment: texting while standing, but has to lean over onto counter on B elbows for support                            Cognition Arousal/Alertness: Awake/alert Behavior During Therapy: WFL for tasks assessed/performed Overall Cognitive Status: Within Functional Limits for tasks assessed                                        Exercises      General Comments        Pertinent Vitals/Pain Pain Assessment: No/denies pain    Home Living  Prior Function            PT Goals (current goals can now be found in the care plan section) Acute Rehab PT Goals Patient Stated Goal: get better PT Goal Formulation: With patient Time For Goal Achievement: 07/03/20 Potential to Achieve Goals: Fair Progress towards PT goals: Progressing toward goals    Frequency    Min 2X/week      PT Plan Current plan remains appropriate    Co-evaluation              AM-PAC PT "6 Clicks" Mobility   Outcome Measure  Help needed turning from your back to your side while in a flat bed without using bedrails?: None Help needed moving from  lying on your back to sitting on the side of a flat bed without using bedrails?: A Little Help needed moving to and from a bed to a chair (including a wheelchair)?: A Little Help needed standing up from a chair using your arms (e.g., wheelchair or bedside chair)?: None Help needed to walk in hospital room?: A Little Help needed climbing 3-5 steps with a railing? : A Lot 6 Click Score: 19    End of Session Equipment Utilized During Treatment: Gait belt;Oxygen Activity Tolerance: Patient tolerated treatment well Patient left: in chair;with call bell/phone within reach;with chair alarm set   PT Visit Diagnosis: Unsteadiness on feet (R26.81);Muscle weakness (generalized) (M62.81)     Time: [id number] PT Time Calculation (min) (ACUTE ONLY): 25 min  Charges:  $Therapeutic Activity: 23-37 mins                     Lavone Nian, PT, DPT 06/21/20, 2:39 PM    [place] Schooner 06/21/2020, 2:36 PM

## 2020-06-21 NOTE — Discharge Summary (Signed)
[facility] Discharge Summary   Patient: Karen [name] [id number]  PCP: Earlie Counts, FNP  Date of admission: 06/17/2020   Date of discharge:  06/21/2020     Discharge Diagnoses:  Principal diagnosis Acute on chronic diastolic CHF Active Problems:   Weakness   Type 2 diabetes mellitus with hyperlipidemia ([place])   Acute CHF (congestive heart failure) ([place])   Admitted From: Home Disposition:  Home  With HHPT  Recommendations for Outpatient Follow-up:  1. PCP: in 1 wk 2. F/u Cardio in 1-2 wks 3. Follow up LABS/TEST:  BMP in 1 wk   Diet recommendation: Cardiac diet  Activity: The patient is advised to gradually reintroduce usual activities, as tolerated  Discharge Condition: stable  Code Status: Full code   History of present illness: As per the H and P dictated on admission Hospital Course:  Brief history. Patient admitted 06/17/2020 in the evening with acute on chronic diastolic congestive heart failure. Patient has chronic hypoxic respiratory failure on 3 L. Other history includes essential hypertension,hyperlipidemia,paroxysmal atrial fibrillation.Patient was started on IV Lasix. Assessment and Plan: # Acute diastolic congestive heart failure, moderate aortic stenosis on last echocardiogram. S/p IV Lasix 40 mg IV twice daily.BNP actually on the lower side. Not quite sure what to make of this. Patient not on beta-blocker secondary to junctional bradycardia when taking this class of medications. BNP 55 within normal range. TTE shows LVEF 60-65%, mild LV hypertrophy. Cr 1.25 elevated yesterday and today, skipped a dose of Lasix yesterday.  start Lasix 40 mg p.o. daily on discharge, repeat BMP after 1 week.  Continue fluid restriction 1.5 L/day.  Follow with PCP in 1 wk and cardiologist in 1 to 2 weeks # Acute on chronic hypoxic respiratory failure. The patient did have a pulse ox of 85% early this morning. Unclear if that was on oxygen or off oxygen. Patient  was on 4 L of oxygen when I saw her and dialed her down to her normal 3 L. added inhaler and nebulizer treatments in case COPD playing a role with her trouble breathing. Holding off on steroids at this point. Covid negative. # Hypertension, BP soft, so decreased dose of Losartan from 100 to 50 mg pod Monitor BP at home and f/u pcp # Type 2 diabetes mellitus, resumed home meds.  Continue diabetic diet and follow with PCP.  #Paroxysmal atrial fibrillation on Eliquis for anticoagulation.Not on medications for rate control. TSH 2.3 within normal range # GERD on Protonix # CT scan showing enlargement of the left lobe of the liver. Will check hepatitis profiles. No ascites seen on CT scan # Iron deficiency, iron saturation 10%, continue  oral supplement Body mass index is 36.86 kg/m. Nutrition Interventions: # Chest pain persistent since last night, on and off since admission, most likely musculoskeletal as patient has tenderness on palpation on the left side of chest wall.  Cardiology was following, troponin remained negative, no significant EKG changes and no wall motion abnormality on 2D echocardiogram.  Cardiology cleared to discharge and follow-up as an outpatient.  Patient was seen by physical therapy, who recommended Home health, which was arranged. On the day of the discharge the patient's vitals were stable, and no other acute medical condition were reported by patient. the patient was felt safe to be discharge at Home with Home health.  Consultants: [place] Procedures: None  Discharge Exam: General: Appear in mild distress, no Rash; Oral Mucosa Clear, moist. Cardiovascular: S1 and S2 Present, no Murmur, Respiratory: nonormal respiratory  effort, Bilateral Air entry present and no Crackles, no wheezes Abdomen: Bowel Sound present, Soft and no tenderness, no hernia Extremities: Mild pedal edema almost resolved, no calf tenderness Neurology: alert and oriented to time, place, and  person affect appropriate.  Filed Weights   06/19/20 0500 06/20/20 0421 06/21/20 0105  Weight: 97.2 kg 97.7 kg 94.4 kg   Vitals:   06/21/20 1133 06/21/20 1309  BP: 127/64   Pulse: 76   Resp: 20   Temp: 98.5 F (36.9 C)   SpO2: 98% 98%    DISCHARGE MEDICATION: Allergies as of 06/21/2020      Reactions   Ace Inhibitors Hives   Beta Adrenergic Blockers    Junctional bradycardia   Gabapentin Hives   Lisinopril Hives   Lyrica [pregabalin] Hives   Shrimp [shellfish Allergy] Swelling   Swelling of the lips      Medication List    STOP taking these medications   hydrochlorothiazide 25 MG tablet Commonly known as: HYDRODIURIL     TAKE these medications   acetaminophen 500 MG tablet Commonly known as: TYLENOL Take 1-2 tablets (500-1,000 mg total) by mouth every 6 (six) hours as needed for mild pain or fever. Do not take more than 4 grams a day   albuterol 108 (90 Base) MCG/ACT inhaler Commonly known as: VENTOLIN HFA Inhale 2 puffs into the lungs every 6 (six) hours as needed for wheezing or shortness of breath.   atorvastatin 10 MG tablet Commonly known as: LIPITOR Take 10 mg by mouth daily.   Eliquis 5 MG Tabs tablet Generic drug: apixaban Take 1 tablet by mouth twice daily   esomeprazole 40 MG capsule Commonly known as: NEXIUM Take 40 mg by mouth daily.   fluticasone 50 MCG/ACT nasal spray Commonly known as: FLONASE Place 2 sprays into both nostrils daily.   Fluticasone-Umeclidin-Vilant 100-62.5-25 MCG/INH Aepb Inhale 1 puff into the lungs daily.   furosemide 40 MG tablet Commonly known as: Lasix Take 1 tablet (40 mg total) by mouth daily.   insulin lispro 100 UNIT/ML KwikPen Commonly known as: HumaLOG KwikPen Inject 10 Units into the skin 3 (three) times daily.   ipratropium 0.06 % nasal spray Commonly known as: ATROVENT Place 2 sprays into both nostrils 4 (four) times daily.   Iron 325 (65 Fe) MG Tabs Take 1 tablet by mouth daily.   Lantus  SoloStar 100 UNIT/ML Solostar Pen Generic drug: insulin glargine Inject 45 Units into the skin at bedtime.   losartan 50 MG tablet Commonly known as: COZAAR Take 1 tablet (50 mg total) by mouth daily. Start taking on: June 22, 2020 What changed:   medication strength  how much to take   metFORMIN 500 MG tablet Commonly known as: GLUCOPHAGE Take 1,000 mg by mouth daily.   montelukast 10 MG tablet Commonly known as: SINGULAIR Take 10 mg by mouth at bedtime.   Ozempic (0.25 or 0.5 MG/DOSE) 2 MG/1.5ML Sopn Generic drug: Semaglutide(0.25 or 0.'5MG'$ /DOS) SMARTSIG:0.375 Milliliter(s) SUB-Q Once a Week   vitamin B-12 500 MCG tablet Commonly known as: CYANOCOBALAMIN Take 500 mcg by mouth daily.      Allergies  Allergen Reactions  . Ace Inhibitors Hives  . Beta Adrenergic Blockers     Junctional bradycardia  . Gabapentin Hives  . Lisinopril Hives  . Lyrica [Pregabalin] Hives  . Shrimp [Shellfish Allergy] Swelling    Swelling of the lips   Discharge Instructions    Call MD for:  difficulty breathing, headache or visual disturbances  Complete by: As directed    Call MD for:  extreme fatigue   Complete by: As directed    Call MD for:  persistant dizziness or light-headedness   Complete by: As directed    Call MD for:  persistant nausea and vomiting   Complete by: As directed    Call MD for:  severe uncontrolled pain   Complete by: As directed    Call MD for:  temperature >100.4   Complete by: As directed    Diet - low sodium heart healthy   Complete by: As directed    Discharge instructions   Complete by: As directed    Follow-up with PCP in 1 week, repeat BMP after 1 week to check renal functions Fluid striction 1.5 L/day advised Follow with cardiologist in 1 to 2 weeks   Increase activity slowly   Complete by: As directed       The results of significant diagnostics from this hospitalization (including imaging, microbiology, ancillary and laboratory) are  listed below for reference.    Significant Diagnostic Studies: DG Chest 2 View  Result Date: 06/17/2020 CLINICAL DATA:  Short of breath EXAM: CHEST - 2 VIEW COMPARISON:  05/21/2020 FINDINGS: Mild cardiac enlargement. Progressive vascular congestion. Small pleural effusion on the lateral view. Possible mild interstitial edema has developed. IMPRESSION: Findings consistent with mild fluid overload with mild interstitial edema and small pleural effusion. Electronically Signed   By: Franchot Gallo M.D.   On: 06/17/2020 19:23   ECHOCARDIOGRAM COMPLETE  Result Date: 06/19/2020    ECHOCARDIOGRAM REPORT   Patient Name:   YONNA DIPPOLD Date of Exam: 06/19/2020 Medical Rec #:  [id number]        Height:       63.0 in Accession #:    [id number]       Weight:       214.3 lb Date of Birth:  09/01/1945       BSA:          1.991 m Patient Age:    89 years         BP:           145/82 mmHg Patient Gender: F                HR:           73 bpm. Exam Location:  [facility] Procedure: 2D Echo Indications:     Aortic stenosis I35.0  History:         Patient has prior history of Echocardiogram examinations, most                  recent 09/28/2019.  Sonographer:     Kathlen Brunswick RDCS Referring Phys:  [id number] [place] Diagnosing Phys: Dorris Carnes MD IMPRESSIONS  1. Left ventricular ejection fraction, by estimation, is 60 to 65%. The left ventricle has normal function. There is mild left ventricular hypertrophy.  2. Right ventricular systolic function is normal. The right ventricular size is normal.  3. Trivial mitral valve regurgitation.  4. AV is thickened, calcified Difficult to see well Peak and mean gradients through the vlave are 30 and 15 mm Hg respectively AVA (VTI) is 1.03 cm2. Dimensionless index is 0.44. All consistent with mild to moderate AS. Compared to echo report from September 28, 2019 no significant change . Aortic valve regurgitation is not visualized.  5. The inferior vena cava is normal in size with greater  than 50% respiratory variability, suggesting  right atrial pressure of 3 mmHg. FINDINGS  Left Ventricle: Left ventricular ejection fraction, by estimation, is 60 to 65%. The left ventricle has normal function. Global longitudinal strain performed but not reported based on interpreter judgement due to suboptimal tracking. The left ventricular internal cavity size was normal in size. There is mild left ventricular hypertrophy. Right Ventricle: The right ventricular size is normal. Right vetricular wall thickness was not assessed. Right ventricular systolic function is normal. Left Atrium: Left atrial size was normal in size. Right Atrium: Right atrial size was normal in size. Pericardium: There is no evidence of pericardial effusion. Mitral Valve: There is mild thickening of the mitral valve leaflet(s). Mild mitral annular calcification. Trivial mitral valve regurgitation. Tricuspid Valve: The tricuspid valve is normal in structure. Tricuspid valve regurgitation is trivial. Aortic Valve: AV is thickened, calcified Difficult to see well Peak and mean gradients through the vlave are 30 and 15 mm Hg respectively AVA (VTI) is 1.03 cm2. Dimensionless index is 0.44. All consistent with mild to moderate AS. Compared to echo report  from September 28, 2019 no significant change. Aortic valve regurgitation is not visualized. Aortic valve mean gradient measures 14.0 mmHg. Aortic valve peak gradient measures 26.9 mmHg. Aortic valve area, by VTI measures 1.12 cm. Pulmonic Valve: The pulmonic valve was not well visualized. Pulmonic valve regurgitation is not visualized. Aorta: The aortic root is normal in size and structure. Venous: The inferior vena cava is normal in size with greater than 50% respiratory variability, suggesting right atrial pressure of 3 mmHg. IAS/Shunts: No atrial level shunt detected by color flow Doppler.  LEFT VENTRICLE PLAX 2D LVIDd:         4.56 cm  Diastology LVIDs:         3.05 cm  LV e' medial:    5.44 cm/s  LV PW:         1.23 cm  LV E/e' medial:  15.2 LV IVS:        1.32 cm  LV e' lateral:   7.07 cm/s LVOT diam:     1.80 cm  LV E/e' lateral: 11.7 LV SV:         61 LV SV Index:   31 LVOT Area:     2.54 cm  RIGHT VENTRICLE RV Basal diam:  3.35 cm RV S prime:     14.30 cm/s TAPSE (M-mode): 3.0 cm LEFT ATRIUM             Index       RIGHT ATRIUM           Index LA diam:        3.80 cm 1.91 cm/m  RA Area:     14.00 cm LA Vol (A2C):   49.0 ml 24.61 ml/m RA Volume:   29.80 ml  14.97 ml/m LA Vol (A4C):   35.2 ml 17.68 ml/m LA Biplane Vol: 42.2 ml 21.19 ml/m  AORTIC VALVE AV Area (Vmax):    1.09 cm AV Area (Vmean):   1.06 cm AV Area (VTI):     1.12 cm AV Vmax:           259.20 cm/s AV Vmean:          170.800 cm/s AV VTI:            0.541 m AV Peak Grad:      26.9 mmHg AV Mean Grad:      14.0 mmHg LVOT Vmax:         111.00  cm/s LVOT Vmean:        71.300 cm/s LVOT VTI:          0.239 m LVOT/AV VTI ratio: 0.44  AORTA Ao Root diam: 2.40 cm MITRAL VALVE                TRICUSPID VALVE MV Area (PHT): 2.92 cm     TV Peak grad:   29.4 mmHg MV Decel Time: 260 msec     TV Vmax:        2.71 m/s MV E velocity: 82.70 cm/s MV A velocity: 110.00 cm/s  SHUNTS MV E/A ratio:  0.75         Systemic VTI:  0.24 m                             Systemic Diam: 1.80 cm Dorris Carnes MD Electronically signed by Dorris Carnes MD Signature Date/Time: 06/19/2020/2:31:40 PM    Final     Microbiology: Recent Results (from the past 240 hour(s))  SARS CORONAVIRUS 2 (TAT 6-24 HRS) Nasopharyngeal Nasopharyngeal Swab     Status: None   Collection Time: 06/17/20 10:09 PM   Specimen: Nasopharyngeal Swab  Result Value Ref Range Status   SARS Coronavirus 2 NEGATIVE NEGATIVE Final    Comment: (NOTE) SARS-CoV-2 target nucleic acids are NOT DETECTED.  The SARS-CoV-2 RNA is generally detectable in upper and lower respiratory specimens during the acute phase of infection. Negative results do not preclude SARS-CoV-2 infection, do not rule  out co-infections with other pathogens, and should not be used as the sole basis for treatment or other patient management decisions. Negative results must be combined with clinical observations, patient history, and epidemiological information. The expected result is Negative.  Fact Sheet for Patients: SugarRoll.be  Fact Sheet for Healthcare Providers: https://www.woods-mathews.com/  This test is not yet approved or cleared by the [place] FDA and  has been authorized for detection and/or diagnosis of SARS-CoV-2 by FDA under an Emergency Use Authorization (EUA). This EUA will remain  in effect (meaning this test can be used) for the duration of the COVID-19 declaration under Se ction 564(b)(1) of the Act, 21 U.S.C. section 360bbb-3(b)(1), unless the authorization is terminated or revoked sooner.  Performed at [facility], [place] [address] [place], [place] [address]      Labs: CBC: Recent Labs  Lab 06/17/20 1808 06/18/20 0653 06/20/20 0510 06/21/20 0542  WBC 10.3 10.0 9.0 10.8*  HGB 10.0* 10.4* 11.1* 10.9*  HCT 30.8* 32.4* 34.5* 34.0*  MCV 89.5 89.3 88.5 88.3  PLT 253 248 276 [id number]   Basic Metabolic Panel: Recent Labs  Lab 06/17/20 1808 06/18/20 0653 06/19/20 0503 06/19/20 0941 06/20/20 0510 06/21/20 0542  NA 134* 138 134*  --  133* 132*  K 4.6 3.9 3.9  --  3.8 4.0  CL 99 96* 92*  --  89* 91*  CO2 27 31 32  --  33* 31  GLUCOSE 181* 93 266*  --  219* 255*  BUN '18 16 19  '$ --  30* 30*  CREATININE 0.86 0.76 0.99  --  1.25* 1.25*  CALCIUM 9.1 9.1 9.0  --  9.1 9.1  MG  --   --   --  1.7 2.0  --   PHOS  --   --   --  3.8 4.5  --    Liver Function Tests: No results for input(s): AST, ALT, ALKPHOS, BILITOT, PROT,  ALBUMIN in the last 168 hours. No results for input(s): LIPASE, AMYLASE in the last 168 hours. No results for input(s): AMMONIA in the last 168 hours. Cardiac Enzymes: No results for input(s): CKTOTAL,  CKMB, CKMBINDEX, TROPONINI in the last 168 hours. BNP (last 3 results) Recent Labs    12/05/19 1638 05/21/20 2034 06/19/20 0941  BNP 84.6 25.9 55.6   CBG: Recent Labs  Lab 06/17/20 1811 06/18/20 0039 06/18/20 2138 06/19/20 2122  GLUCAP 172* 140* 254* 195*    Time spent: 35 minutes  Signed:  Val Riles  [facility]  06/21/2020 2:44 PM

## 2020-06-21 NOTE — Care Management Important Message (Signed)
Important Message  Patient Details  Name: Karen [name] MRN: [id number] Date of Birth: 1945/09/21   Medicare Important Message Given:  Yes     Karen [name] 06/21/2020, 3:21 PM

## 2020-06-21 NOTE — Progress Notes (Signed)
Glucose POCT performed with patient's on left upper arm. 2100 results 249 0000 results 189

## 2020-06-23 ENCOUNTER — Telehealth (HOSPITAL_COMMUNITY): Payer: Self-pay

## 2020-06-23 NOTE — Telephone Encounter (Signed)
Attempted to contact to set up a home visit.  Mail box is full and no one answered.    [place] [phone]

## 2020-07-06 ENCOUNTER — Telehealth (HOSPITAL_COMMUNITY): Payer: Self-pay

## 2020-07-06 ENCOUNTER — Ambulatory Visit: Payer: Medicare Other | Admitting: Family

## 2020-07-06 NOTE — Telephone Encounter (Signed)
Contacted Shandi to see if she is going to her HF clinic appt today.  She advised that she goes to cardiology and wishes not to go to HF clinic.  She states it is too many appts.  Attempted to try to explain importance but she still refused.  Made a home visit appt with her on Thursday this week. She does have home health coming in with PT.  She states doing good.  Will visit for heart failure.   [place] [phone]

## 2020-07-08 ENCOUNTER — Other Ambulatory Visit (HOSPITAL_COMMUNITY): Payer: Self-pay

## 2020-07-11 ENCOUNTER — Other Ambulatory Visit: Payer: Self-pay | Admitting: Cardiovascular Disease

## 2020-07-12 ENCOUNTER — Encounter (HOSPITAL_COMMUNITY): Payer: Self-pay

## 2020-07-12 NOTE — Progress Notes (Signed)
Had a home visit with Karen [name].  She states doing ok.  She has all her medications and aware of how to take them.  She states her sugars have been running better, no readings over 200.  Her husband is not home, she is not very talkative and does not like a lot of visits.  She is aware to call with any problems.  She states does not need anything.  She is sleeping better and still naps in the daytime.  She has everything she needs for daily living.  Her husband and her would like an aid everyday to help with house cleaning. Not been able to find a service for them.  Will continue to look for one.  They have a grown son that moved back home but does nothing for them.  Her husband 's health is declining and he is unable to do as much around the house.  Her PCP is suppose to do a palliative referral.  She denies any problems or pain today.  Will continue to visit for heart failure.   [name] [phone]

## 2020-07-14 ENCOUNTER — Ambulatory Visit (INDEPENDENT_AMBULATORY_CARE_PROVIDER_SITE_OTHER): Payer: Medicare Other

## 2020-07-14 ENCOUNTER — Ambulatory Visit
Admission: EM | Admit: 2020-07-14 | Discharge: 2020-07-14 | Disposition: A | Discharge status: Home / Self Care | Payer: Medicare Other | Attending: Emergency Medicine | Admitting: Emergency Medicine

## 2020-07-14 ENCOUNTER — Encounter: Payer: Self-pay | Admitting: Emergency Medicine

## 2020-07-14 ENCOUNTER — Other Ambulatory Visit: Payer: Self-pay

## 2020-07-14 DIAGNOSIS — R059 Cough, unspecified: Secondary | ICD-10-CM

## 2020-07-14 DIAGNOSIS — R0602 Shortness of breath: Secondary | ICD-10-CM | POA: Diagnosis not present

## 2020-07-14 DIAGNOSIS — J441 Chronic obstructive pulmonary disease with (acute) exacerbation: Secondary | ICD-10-CM

## 2020-07-14 MED ORDER — BENZONATATE 100 MG PO CAPS: 200.0000 mg | ORAL_CAPSULE | Freq: Three times a day (TID) | ORAL | 0 refills | Status: DC

## 2020-07-14 MED ORDER — PREDNISONE 20 MG PO TABS: 60.0000 mg | ORAL_TABLET | Freq: Every day | ORAL | 0 refills | Status: AC

## 2020-07-14 MED ORDER — PROMETHAZINE-DM 6.25-15 MG/5ML PO SYRP: 5.0000 mL | ORAL_SOLUTION | Freq: Four times a day (QID) | ORAL | 0 refills | Status: DC | PRN

## 2020-07-14 NOTE — Discharge Instructions (Addendum)
Continue to use your albuterol inhaler or nebulizer as needed for shortness of breath and wheezing.  Use the Tessalon Perles every 8 hours during the day as needed for your cough.  Use the Promethazine DM cough syrup at bedtime.  This will make you drowsy so do not take it throughout the day.  Start the prednisone when you get it filled and then take it each morning for 5 days to help decrease the inflammation in your lungs and help you breathe better.  If you have any worsening shortness of breath, especially at rest, you feel you cannot catch her breath, or is late sign your lip start turning blue you need to go to the ER for evaluation.

## 2020-07-14 NOTE — ED Provider Notes (Signed)
[facility]    CSN: [id number] Arrival date & time: 07/14/20  1010      History   Chief Complaint Chief Complaint  Patient presents with  . Cough    HPI Karen [name] is a 75 y.o. female.   HPI   75 year old female here for evaluation of cough and shortness of breath.  Patient reports that she is been coughing for the past week and that every time she lays down her cough increases.  She has had increasing shortness of breath but she denies any sputum production.  Additionally, she is complaining of midsternal chest pain that does not radiate, wheezing, and a headache.  Patient denies fever, an increase in her swelling in her legs, runny nose, body aches, or sick contacts.  Past Medical History:  Diagnosis Date  . (HFpEF) heart failure with preserved ejection fraction ([place])    a. 2017 Echo: EF 50%; b. 06/2018 Echo: EF 50-55%; c. 08/2018 Echo: EF 50-55%, Nl RV fxn; d. 09/2019 Echo: EF 55-60%, no rwma, mild LVH, Gr1 DD, nl RV size/fxn, PASP 63.10mHg. Mildly dil LA. Triv MR. Mod AS (AoV 0.94cm^2 VTI; mean grad 17.391mg).  . Acute on chronic respiratory failure with hypoxia and hypercapnia (HCPaullina11/05/2014  . Anemia   . 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 Asthma   . Cataract   . CKD (chronic kidney disease), stage III (HCLake City  . COPD (chronic obstructive pulmonary disease) (HCRaymond   a. 06/2018 tobacco use, home 3L oxygen   . Diabetes mellitus without complication (HCHenagar   a. 09/2019 A1C 8.8  . Edema, peripheral 04/20/2014  . GI bleed 06/28/2019  . History of kidney stones   . Hyperlipidemia   . Hypertension   . Iron deficiency anemia 06/22/2014  . Junctional bradycardia    a. In setting of beta blocker therapy.  . Leucocytosis 10/19/2015  . Moderate aortic stenosis    a.  09/2019 Echo: Mod AS (AoV 0.94cm^2 VTI; mean grad 17.42m742m).  . Morbid obesity (HCCLake View . Overactive bladder   . Primary osteoarthritis of right knee 09/01/2016  . Sciatica 01/07/2015    Patient Active Problem  List   Diagnosis Date Noted  . Aortic stenosis, moderate   . AF (paroxysmal atrial fibrillation) (HCCFlint Hill . Gastroesophageal reflux disease without esophagitis   . Acute CHF (congestive heart failure) (HCCLeeton4/14/2022  . Chronic respiratory failure with hypercapnia (HCCEarlsboro3/18/2022  . Severe sepsis (HCCJohnson Lane3/18/2022  . UTI (urinary tract infection) 05/21/2020  . Hyperglycemia due to type 2 diabetes mellitus (HCCRussellville3/18/2022  . Abnormal CT of liver 05/21/2020  . 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 05/21/2020  . Morbid obesity (HCCSabula3/07/2020  . Acute hip pain, left 10/23/2019  . Lumbar stenosis with neurogenic claudication 10/23/2019  . COPD with acute exacerbation (HCCLenape Heights7/25/2021  . Acute diastolic CHF (congestive heart failure) (HCCBlooming Valley7/25/2021  . (HFpEF) heart failure with preserved ejection fraction (HCCFort Chiswell7/24/2021  . Rectal bleeding 06/28/2019  . Hypokalemia 06/28/2019  . Hyponatremia 06/28/2019  . Type 2 diabetes mellitus with hyperlipidemia (HCCBuena Vista4/24/2021  . CKD (chronic kidney disease), stage IIIa 06/28/2019  . Atrial fibrillation, chronic (HCCOriole Beach4/24/2021  . Pulmonary edema 02/27/2019  . Bradycardia 02/24/2019  . Chronic respiratory failure with hypoxia (HCCEl Paso de Robles2/20/2020  . Atypical chest pain 02/13/2019  . Osteopenia of neck of left femur 11/20/2018  . AVM (arteriovenous malformation) of small bowel, acquired   . Acute gastric ulcer with hemorrhage   .  Chronic diastolic heart failure ([place]) 08/22/2018  . Diarrhea 08/22/2018  . Junctional bradycardia   . Acute on chronic heart failure with preserved ejection fraction (HFpEF) ([place])   . AKI (acute kidney injury) ([place])   . Symptomatic bradycardia 08/05/2018  . Acute on chronic respiratory failure ([place]) 06/25/2018  . 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 diabetes mellitus ([place]) 01/25/2018  . History of non anemic vitamin B12 deficiency 01/25/2018  . Personal history of kidney stones 11/12/2017  .  Urge incontinence 11/12/2017  . History of leukocytosis 09/17/2017  . Right ureteral stone 09/15/2017  . Acute GI bleeding   . GI bleed 08/26/2017  . Arthritis 08/10/2017  . Stage 4 chronic kidney disease ([place]) 08/10/2017  . COPD (chronic obstructive pulmonary disease) ([place]) 08/10/2017  . Diabetes mellitus type 2, uncomplicated ([place]) 0000000  . Hypertension 08/10/2017  . Obesity (BMI 35.0-39.9 without comorbidity) 04/11/2017  . Primary osteoarthritis of right knee 09/01/2016  . Leucocytosis 10/19/2015  . 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 Acute on chronic respiratory failure with hypoxia and hypercapnia ([place]) 01/07/2015  . Sciatica 01/07/2015  . Weakness 01/07/2015  . 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 Iron deficiency anemia 06/22/2014  . Microalbuminuria 06/22/2014  . CHF (congestive heart failure) ([place]) 04/20/2014  . Edema, peripheral 04/20/2014    Past Surgical History:  Procedure Laterality Date  . APPENDECTOMY    . CESAREAN SECTION     x3  . CHOLECYSTECTOMY    . COLONOSCOPY WITH PROPOFOL N/A 08/28/2017   Procedure: COLONOSCOPY WITH PROPOFOL;  Surgeon: Lucilla Lame, MD;  [facility];  Service: Endoscopy;  Laterality: N/A;  . COLONOSCOPY WITH PROPOFOL N/A 08/29/2017   Procedure: COLONOSCOPY WITH PROPOFOL;  Surgeon: Lucilla Lame, MD;  Location: [facility] ENDOSCOPY;  Service: Endoscopy;  Laterality: N/A;  . CYSTOSCOPY W/ URETERAL STENT PLACEMENT Right 09/15/2017   Procedure: CYSTOSCOPY WITH RETROGRADE PYELOGRAM/URETERAL STENT PLACEMENT;  Surgeon: Cleon Gustin, MD;  Location: [facility] ORS;  Service: Urology;  Laterality: Right;  . CYSTOSCOPY/URETEROSCOPY/HOLMIUM LASER/STENT PLACEMENT Right 10/09/2017   Procedure: CYSTOSCOPY/URETEROSCOPY/HOLMIUM LASER/STENT PLACEMENT;  Surgeon: Abbie Sons, MD;  Location: [facility] ORS;  Service: Urology;  Laterality: Right;  right Stent exchange  . ESOPHAGOGASTRODUODENOSCOPY (EGD) WITH PROPOFOL N/A 09/16/2018    Procedure: ESOPHAGOGASTRODUODENOSCOPY (EGD) WITH PROPOFOL;  Surgeon: Lin Landsman, MD;  Location: [place];  Service: Gastroenterology;  Laterality: N/A;  . EYE SURGERY      OB History   No obstetric history on file.      Home Medications    Prior to Admission medications   Medication Sig Start Date End Date Taking? Authorizing Provider  acetaminophen (TYLENOL) 500 MG tablet Take 1-2 tablets (500-1,000 mg total) by mouth every 6 (six) hours as needed for mild pain or fever. Do not take more than 4 grams a day 05/25/20  Yes Rizwan, Eunice Blase, MD  albuterol (VENTOLIN HFA) 108 (90 Base) MCG/ACT inhaler Inhale 2 puffs into the lungs every 6 (six) hours as needed for wheezing or shortness of breath.    Yes [provider]  atorvastatin (LIPITOR) 10 MG tablet Take 10 mg by mouth daily.    Yes [provider]  benzonatate (TESSALON) 100 MG capsule Take 2 capsules (200 mg total) by mouth every 8 (eight) hours. 07/14/20  Yes Margarette Canada, NP  ELIQUIS 5 MG TABS tablet Take 1 tablet by mouth twice daily 03/11/20  Yes Gollan, Kathlene November, MD  esomeprazole (NEXIUM) 40 MG capsule Take 40 mg by mouth  daily. 12/17/18  Yes [provider]  Ferrous Sulfate (IRON) 325 (65 Fe) MG TABS Take 1 tablet by mouth daily. 09/14/17  Yes [provider]  fluticasone (FLONASE) 50 MCG/ACT nasal spray Place 2 sprays into both nostrils daily. 11/15/18  Yes [provider]  Fluticasone-Umeclidin-Vilant 100-62.5-25 MCG/INH AEPB Inhale 1 puff into the lungs daily. 12/25/17  Yes [provider]  furosemide (LASIX) 40 MG tablet Take 1 tablet (40 mg total) by mouth daily. 06/21/20 06/21/21 Yes Val Riles, MD  insulin glargine (LANTUS SOLOSTAR) 100 UNIT/ML Solostar Pen Inject 45 Units into the skin at bedtime. 05/25/20  Yes Debbe Odea, MD  insulin lispro (HUMALOG KWIKPEN) 100 UNIT/ML KwikPen Inject 10 Units into the skin 3 (three) times daily. 05/25/20  Yes Debbe Odea, MD   ipratropium (ATROVENT) 0.06 % nasal spray Place 2 sprays into both nostrils 4 (four) times daily. 05/28/20  Yes Margarette Canada, NP  losartan (COZAAR) 50 MG tablet Take 1 tablet (50 mg total) by mouth daily. 06/22/20 09/20/20 Yes Val Riles, MD  metFORMIN (GLUCOPHAGE) 500 MG tablet Take 1,000 mg by mouth daily.   Yes [provider]  montelukast (SINGULAIR) 10 MG tablet Take 10 mg by mouth at bedtime.   Yes [provider]  OZEMPIC, 0.25 OR 0.5 MG/DOSE, 2 MG/1.5ML SOPN SMARTSIG:0.375 Milliliter(s) SUB-Q Once a Week 05/12/20  Yes [provider]  predniSONE (DELTASONE) 20 MG tablet Take 3 tablets (60 mg total) by mouth daily with breakfast for 5 days. 3 tablets by mouth daily for 3 days, 2 tablets by mouth daily for 4 days, and 1 tablet by mouth daily for 5 days. 07/14/20 07/19/20 Yes Margarette Canada, NP  promethazine-dextromethorphan (PROMETHAZINE-DM) 6.25-15 MG/5ML syrup Take 5 mLs by mouth 4 (four) times daily as needed. 07/14/20  Yes Margarette Canada, NP  vitamin B-12 (CYANOCOBALAMIN) 500 MCG tablet Take 500 mcg by mouth daily.   Yes [provider]    Family History Family History  Problem Relation Age of Onset  . Other Mother        unknown medical history  . Other Father        unknown medical history    Social History Social History   Tobacco Use  . Smoking status: Former Smoker    Packs/day: 1.00    Years: 20.00    Pack years: 20.00    Quit date: 12/04/1992    Years since quitting: 27.6  . Smokeless tobacco: Never Used  Vaping Use  . Vaping Use: Never used  Substance Use Topics  . Alcohol use: No  . Drug use: No     Allergies   Ace inhibitors, Beta adrenergic blockers, Gabapentin, Lisinopril, Lyrica [pregabalin], and Shrimp [shellfish allergy]   Review of Systems Review of Systems  Constitutional: Negative for activity change, appetite change and fever.  HENT: Negative for congestion and rhinorrhea.   Respiratory: Positive for cough,  shortness of breath and wheezing.   Cardiovascular: Positive for chest pain. Negative for palpitations and leg swelling.  Neurological: Positive for headaches.  Hematological: Negative.   Psychiatric/Behavioral: Negative.      Physical Exam Triage Vital Signs ED Triage Vitals  Enc Vitals Group     BP 07/14/20 1041 116/62     Pulse Rate 07/14/20 1041 93     Resp 07/14/20 1041 20     Temp 07/14/20 1041 98.3 F (36.8 C)     Temp Source 07/14/20 1041 Oral     SpO2 07/14/20 1041 99 %  Weight 07/14/20 1040 205 lb 14.6 oz (93.4 kg)     Height 07/14/20 1040 '5\' 3"'$  (1.6 m)     Head Circumference --      Peak Flow --      Pain Score 07/14/20 1040 0     Pain Loc --      Pain Edu? --      Excl. in [place]? --    No data found.  Updated Vital Signs BP 116/62 (BP Location: Right Arm)   Pulse 93   Temp 98.3 F (36.8 C) (Oral)   Resp 20   Ht '5\' 3"'$  (1.6 m)   Wt 205 lb 14.6 oz (93.4 kg)   SpO2 99%   BMI 36.48 kg/m   Visual Acuity Right Eye Distance:   Left Eye Distance:   Bilateral Distance:    Right Eye Near:   Left Eye Near:    Bilateral Near:     Physical Exam Vitals and nursing note reviewed.  Constitutional:      Appearance: She is obese.  HENT:     Head: Normocephalic and atraumatic.  Cardiovascular:     Rate and Rhythm: Normal rate and regular rhythm.     Pulses: Normal pulses.     Heart sounds: Normal heart sounds. No murmur heard. No gallop.   Pulmonary:     Effort: Pulmonary effort is normal.     Breath sounds: No wheezing, rhonchi or rales.  Skin:    General: Skin is warm and dry.     Capillary Refill: Capillary refill takes less than 2 seconds.     Findings: No erythema or rash.  Neurological:     General: No focal deficit present.     Mental Status: She is alert and oriented to person, place, and time.  Psychiatric:        Mood and Affect: Mood normal.        Behavior: Behavior normal.        Thought Content: Thought content normal.        Judgment:  Judgment normal.      UC Treatments / Results  Labs (all labs ordered are listed, but only abnormal results are displayed) Labs Reviewed - No data to display  EKG   Radiology DG Chest 2 View  Result Date: 07/14/2020 CLINICAL DATA:  Shortness of breath and cough EXAM: CHEST - 2 VIEW COMPARISON:  June 17, 2020 FINDINGS: Lungs are clear. Heart is upper normal in size with pulmonary vascularity normal. No adenopathy. There is aortic atherosclerosis. There is degenerative change in thoracic spine. IMPRESSION: Lungs clear. Heart upper normal in size. Aortic Atherosclerosis (ICD10-I70.0). Electronically Signed   By: Lowella Grip III M.D.   On: 07/14/2020 11:35    Procedures Procedures (including critical care time)  Medications Ordered in UC Medications - No data to display  Initial Impression / Assessment and Plan / UC Course  I have reviewed the triage vital signs and the nursing notes.  Pertinent labs & imaging results that were available during my care of the patient were reviewed by me and considered in my medical decision making (see chart for details).   19 old female here for evaluation of cough that is been worsening over the past week and is worse when she lays down and increase in her shortness of breath.  Patient is a longstanding history of CHF but she denies any sputum production and she denies any increase in the swelling of her extremities.  She has  had some midsternal chest pain that does not radiate that to is been going on for last week.  Patient reports that she has had wheezing and she is had the increase the use of her nebulizer and her albuterol inhaler.  Usage varies by day and she reports that it has not always been helpful.  Patient's physical exam reveals an ill-appearing female with normal heart sounds, decreased lung sounds on the left and upper and lower lung fields but no wheezing, rales, or rhonchi present.  Patient has 2+ peripheral pulses globally and 1+  pitting edema from ankles to knees.  Patient reports that her edema is at baseline.  Patient uses 2 L O2 via nasal cannula at baseline as well and has not had to increase her O2 use.  Suspect patient has a COPD exacerbation versus CHF.  Will obtain chest x-ray and EKG.  EKG shows sinus rhythm with a ventricular rate of 89 bpm, PR interval of 146 ms, QRS duration 74 ms, QT 376 ms, and QTc 457 ms.  EKG compared to 06/21/2020 and there is no change.  Chest x-ray reviewed and independently evaluated by me.  Interpretation: No evidence of pneumonia, effusion, or pneumothorax present.  Awaiting radiology overread.  Radiology over read of chest x-ray is that it is a negative exam.  We will treat patient for the exacerbation with prednisone, Tessalon Perles, and Promethazine DM cough syrup for bedtime.  Final Clinical Impressions(s) / UC Diagnoses   Final diagnoses:  COPD exacerbation ([place])     Discharge Instructions     Continue to use your albuterol inhaler or nebulizer as needed for shortness of breath and wheezing.  Use the Tessalon Perles every 8 hours during the day as needed for your cough.  Use the Promethazine DM cough syrup at bedtime.  This will make you drowsy so do not take it throughout the day.  Start the prednisone when you get it filled and then take it each morning for 5 days to help decrease the inflammation in your lungs and help you breathe better.  If you have any worsening shortness of breath, especially at rest, you feel you cannot catch her breath, or is late sign your lip start turning blue you need to go to the ER for evaluation.    ED Prescriptions    Medication Sig Dispense Auth. Provider   benzonatate (TESSALON) 100 MG capsule Take 2 capsules (200 mg total) by mouth every 8 (eight) hours. 21 capsule Margarette Canada, NP   predniSONE (DELTASONE) 20 MG tablet Take 3 tablets (60 mg total) by mouth daily with breakfast for 5 days. 3 tablets by mouth daily for 3 days, 2  tablets by mouth daily for 4 days, and 1 tablet by mouth daily for 5 days. 15 tablet Margarette Canada, NP   promethazine-dextromethorphan (PROMETHAZINE-DM) 6.25-15 MG/5ML syrup Take 5 mLs by mouth 4 (four) times daily as needed. 118 mL Margarette Canada, NP     PDMP not reviewed this encounter.   Margarette Canada, NP 07/14/20 1203

## 2020-07-14 NOTE — ED Triage Notes (Signed)
Patient c/o cough that started 1 week ago. She states she can't lay down at night and sleep due to the coughing. She reports she is more SOB than normal. Denies fever. She declines being tested for COVID.

## 2020-08-18 ENCOUNTER — Telehealth: Payer: Self-pay | Admitting: Cardiovascular Disease

## 2020-08-18 NOTE — Telephone Encounter (Signed)
  Patient calling in after Dr. Blain Pais at [facility] office instructed her to call and ask about Celebrex. Patient is wanting to know if she would be able to take this medication rather than eliquis due to the eliquis not working well with patients knee injection  Please advise

## 2020-08-18 NOTE — Telephone Encounter (Signed)
Dr. Rockey Situ will make the decision on if or how long to hold medication. [facility] will need to send a note w/ their recommendations to the pre-op pool.

## 2020-08-18 NOTE — Telephone Encounter (Signed)
Returning phone call to Karen [name], she stated Dr. Prescott Parma with [facility] orthopedics advised her to ask Dr. Rockey Situ about stopping her Eliquis so she may get Celebrex injections to her knee d/t chronic pain.   Advised that Eliquis is her anticoagulant medication used to treat and prevent blood clots in attempt to prevent stroke/cardiac events since she has a chronic hx of a-fib. Eliquis her blood thinner medicine that reduces blood clotting and NSAIDS like Celebrex can increase the risk of gastrointestinal bleeding when used together.   Advised will reach out to the anticoagulation team to see what their recommendations are for for holding Eliquis for Celebrex injections if safe to do so. Pt verbalized understanding and will await call back for instructions.

## 2020-08-18 NOTE — Telephone Encounter (Signed)
Reached back out to Karen [name] and advised that Dr. Prescott Parma needs to reach out to our office for documented instructions regarding her Eliquis so she may get the Celebrex injections.   We do not give instructions over the phone typically anymore in regards to medications (blood thinners) when to stop and restart, we need proper documentation from the performing provider's office so we can properly give medication instructions and consult our pharmacy and anticoagulation team.    Suggested she call Dr. Blain Pais office and him them call our office or have their office fax [place] the "pre-opt" form to be completed then we can send to our anticoagulation pool for guidance on your Eliquis,  Karen [name] verbalized understanding, will give Dr. Blain Pais office a call.

## 2020-08-19 NOTE — Telephone Encounter (Signed)
Karen [name] at [facility] calling to discuss possible miscommunication by patient .    Please call to discuss meds.  Patient needs ok to take Celebrex for anti inflammatory given she is on Eliquis.

## 2020-08-19 NOTE — Telephone Encounter (Signed)
We usually don't recommend Celebrex for our patients.  When mixed with Eliquis can increase bleed risk (increases concentration of Eliquis in the system).  Also Celebrex side effects include increased risk of GI bleed (in the absence of blood thinners)  Patient has hx of diverticular GI bleed in 4/21.  Also Celebrex does increase risk for thromboembolic events.   Would not suggest use.  Can patient use topical Voltaren gel, capsaicin or Icy Hot type products instead?

## 2020-08-24 NOTE — Telephone Encounter (Signed)
Attempted to reach [facility] with [facility] ortho, she called on 6/16 regarding pt's Celebrex and Eliquis. Unable to make contact, office staff reported she is away from her desk.   Will send a fax advised on Eliquis and Celebrex from our pharmD, unable to send a secure message through the Epic system.    We usually don't recommend Celebrex for our patients.  When mixed with Eliquis can increase bleed risk (increases concentration of Eliquis in the system).  Also Celebrex side effects include increased risk of GI bleed (in the absence of blood thinners)  Patient has hx of diverticular GI bleed in 4/21.  Also Celebrex does increase risk for thromboembolic events.   Would not suggest use.  Can patient use topical Voltaren gel, capsaicin or Icy Hot type products instead?  Did also reached out to Mrs. Puleio as advised her on Eliquis and Celebrex from our pharmacy team, that these medications should not be combine together as she is a high risk for bleed. Mrs. Dismukes verbalized understanding and is thankful for the return call and explanation.   Advised to Mrs. Branon will also fax to [facility] ortho on these medications.

## 2020-08-25 ENCOUNTER — Emergency Department
Admission: EM | Admit: 2020-08-25 | Discharge: 2020-08-25 | Disposition: A | Discharge status: Home / Self Care | Payer: Medicare Other | Attending: Emergency Medicine | Admitting: Emergency Medicine

## 2020-08-25 ENCOUNTER — Other Ambulatory Visit: Payer: Self-pay

## 2020-08-25 ENCOUNTER — Encounter: Payer: Self-pay | Admitting: Emergency Medicine

## 2020-08-25 DIAGNOSIS — Z7901 Long term (current) use of anticoagulants: Secondary | ICD-10-CM | POA: Insufficient documentation

## 2020-08-25 DIAGNOSIS — N184 Chronic kidney disease, stage 4 (severe): Secondary | ICD-10-CM | POA: Insufficient documentation

## 2020-08-25 DIAGNOSIS — Z79899 Other long term (current) drug therapy: Secondary | ICD-10-CM | POA: Diagnosis not present

## 2020-08-25 DIAGNOSIS — I130 Hypertensive heart and chronic kidney disease with heart failure and stage 1 through stage 4 chronic kidney disease, or unspecified chronic kidney disease: Secondary | ICD-10-CM | POA: Insufficient documentation

## 2020-08-25 DIAGNOSIS — Z7984 Long term (current) use of oral hypoglycemic drugs: Secondary | ICD-10-CM | POA: Insufficient documentation

## 2020-08-25 DIAGNOSIS — J45909 Unspecified asthma, uncomplicated: Secondary | ICD-10-CM | POA: Diagnosis not present

## 2020-08-25 DIAGNOSIS — Z87891 Personal history of nicotine dependence: Secondary | ICD-10-CM | POA: Diagnosis not present

## 2020-08-25 DIAGNOSIS — Z794 Long term (current) use of insulin: Secondary | ICD-10-CM | POA: Diagnosis not present

## 2020-08-25 DIAGNOSIS — E1140 Type 2 diabetes mellitus with diabetic neuropathy, unspecified: Secondary | ICD-10-CM | POA: Diagnosis not present

## 2020-08-25 DIAGNOSIS — I5031 Acute diastolic (congestive) heart failure: Secondary | ICD-10-CM | POA: Insufficient documentation

## 2020-08-25 DIAGNOSIS — B9689 Other specified bacterial agents as the cause of diseases classified elsewhere: Secondary | ICD-10-CM | POA: Diagnosis not present

## 2020-08-25 DIAGNOSIS — N390 Urinary tract infection, site not specified: Secondary | ICD-10-CM | POA: Insufficient documentation

## 2020-08-25 DIAGNOSIS — E1122 Type 2 diabetes mellitus with diabetic chronic kidney disease: Secondary | ICD-10-CM | POA: Diagnosis not present

## 2020-08-25 DIAGNOSIS — R339 Retention of urine, unspecified: Secondary | ICD-10-CM | POA: Diagnosis present

## 2020-08-25 DIAGNOSIS — J449 Chronic obstructive pulmonary disease, unspecified: Secondary | ICD-10-CM | POA: Diagnosis not present

## 2020-08-25 LAB — URINALYSIS, COMPLETE (UACMP) WITH MICROSCOPIC
Bilirubin Urine: NEGATIVE
Glucose, UA: >=500 mg/dL — AB
Hgb urine dipstick: NEGATIVE
Ketones, ur: NEGATIVE mg/dL
Nitrite: NEGATIVE
Protein, ur: NEGATIVE mg/dL
Specific Gravity, Urine: 1.006 (ref 1.005–1.030)
pH: 5 (ref 5.0–8.0)

## 2020-08-25 MED ORDER — NITROFURANTOIN MONOHYD MACRO 100 MG PO CAPS: 100.0000 mg | ORAL_CAPSULE | Freq: Two times a day (BID) | ORAL | 0 refills | Status: AC

## 2020-08-25 NOTE — ED Notes (Signed)
NT bladder scanned with zero ml's in bladder.

## 2020-08-25 NOTE — ED Provider Notes (Signed)
[facility] Emergency Department Provider Note   ____________________________________________   Event Date/Time   First MD Initiated Contact with Patient 08/25/20 1315     (approximate)  I have reviewed the triage vital signs and the nursing notes.   HISTORY  Chief Complaint Urinary Retention    HPI Karen [name] is a 75 y.o. female patient with concerns of urinary retention.  Patient states that has been decreased urine output.  She denies any bladder discomfort.  She denies any abdominal pain or pressure.  Patient denies flank pain.  Patient prior to arrival her urine last one episode was last night.  Patient has a history of CHF.  When questioned she denies any increased of peripheral edema or dyspnea.  Patient is an insulin-dependent diabetic.  Patient performed a libre glucose check blood pressure and it was 217.  Patient has not taken her afternoon insulin because she has not eaten because of her arrival to the ED.      Past Medical History:  Diagnosis Date   (HFpEF) heart failure with preserved ejection fraction ([place])    a. 2017 Echo: EF 50%; b. 06/2018 Echo: EF 50-55%; c. 08/2018 Echo: EF 50-55%, Nl RV fxn; d. 09/2019 Echo: EF 55-60%, no rwma, mild LVH, Gr1 DD, nl RV size/fxn, PASP 63.41mHg. Mildly dil LA. Triv MR. Mod AS (AoV 0.94cm^2 VTI; mean grad 17.364mg).   Acute on chronic respiratory failure with hypoxia and hypercapnia (HCC) 01/07/2015   Anemia    Asterixis 01/07/2015   Asthma    Cataract    CKD (chronic kidney disease), stage III (HCC)    COPD (chronic obstructive pulmonary disease) (HCPalm [place]   a. 06/2018 tobacco use, home 3L oxygen    Diabetes mellitus without complication (HCBlencoe   a. 09/2019 A1C 8.8   Edema, peripheral 04/20/2014   GI bleed 06/28/2019   History of kidney stones    Hyperlipidemia    Hypertension    Iron deficiency anemia 06/22/2014   Junctional bradycardia    a. In setting of beta blocker therapy.   Leucocytosis  10/19/2015   Moderate aortic stenosis    a.  09/2019 Echo: Mod AS (AoV 0.94cm^2 VTI; mean grad 17.75m38m).   Morbid obesity (HCCBuckshot  Overactive bladder    Primary osteoarthritis of right knee 09/01/2016   Sciatica 01/07/2015    Patient Active Problem List   Diagnosis Date Noted   Aortic stenosis, moderate    AF (paroxysmal atrial fibrillation) (HCC)    Gastroesophageal reflux disease without esophagitis    Acute CHF (congestive heart failure) (HCCCopalis Beach4/14/2022   Chronic respiratory failure with hypercapnia (HCCWescos[place]3/18/2022   Severe sepsis (HCCWimberley3/18/2022   UTI (urinary tract infection) 05/21/2020   Hyperglycemia due to type 2 diabetes mellitus (HCCWebster Groves3/18/2022   Abnormal CT of liver 05/21/2020   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 05/21/2020   Morbid obesity (HCCChelyan3/07/2020   Acute hip pain, left 10/23/2019   Lumbar stenosis with neurogenic claudication 10/23/2019   COPD with acute exacerbation (HCCSanta Clara7/123456Acute diastolic CHF (congestive heart failure) (HCCPhenix7/25/2021   (HFpEF) heart failure with preserved ejection fraction (HCCForbes7/24/2021   Rectal bleeding 06/28/2019   Hypokalemia 06/28/2019   Hyponatremia 06/28/2019   Type 2 diabetes mellitus with hyperlipidemia (HCCOpheim4/24/2021   CKD (chronic kidney disease), stage IIIa 06/28/2019   Atrial fibrillation, chronic (HCCSmith River4/24/2021   Pulmonary edema 02/27/2019   Bradycardia 02/24/2019   Chronic respiratory  failure with hypoxia ([place]) 02/23/2019   Atypical chest pain 02/13/2019   Osteopenia of neck of left femur 11/20/2018   AVM (arteriovenous malformation) of small bowel, acquired    Acute gastric ulcer with hemorrhage    Chronic diastolic heart failure ([place]) 08/22/2018   Diarrhea 08/22/2018   Junctional bradycardia    Acute on chronic heart failure with preserved ejection fraction (HFpEF) ([place])    AKI (acute kidney injury) ([place])    Symptomatic bradycardia 08/05/2018   Acute on chronic respiratory failure ([place])  06/25/2018   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 diabetes mellitus ([place]) 01/25/2018   History of non anemic vitamin B12 deficiency 01/25/2018   Personal history of kidney stones 11/12/2017   Urge incontinence 11/12/2017   History of leukocytosis 09/17/2017   Right ureteral stone 09/15/2017   Acute GI bleeding    GI bleed 08/26/2017   Arthritis 08/10/2017   Stage 4 chronic kidney disease ([place]) 08/10/2017   COPD (chronic obstructive pulmonary disease) ([place]) 08/10/2017   Diabetes mellitus type 2, uncomplicated ([place]) 0000000   Hypertension 08/10/2017   Obesity (BMI 35.0-39.9 without comorbidity) 04/11/2017   Primary osteoarthritis of right knee 09/01/2016   Leucocytosis 10/19/2015   Asterixis 01/07/2015   Acute on chronic respiratory failure with hypoxia and hypercapnia (HCC) 01/07/2015   Sciatica 01/07/2015   Weakness 01/07/2015   Chronic 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 01/04/2015   Iron deficiency anemia 06/22/2014   Microalbuminuria 06/22/2014   CHF (congestive heart failure) ([place]) 04/20/2014   Edema, peripheral 04/20/2014    Past Surgical History:  Procedure Laterality Date   APPENDECTOMY     CESAREAN SECTION     x3   CHOLECYSTECTOMY     COLONOSCOPY WITH PROPOFOL N/A 08/28/2017   Procedure: COLONOSCOPY WITH PROPOFOL;  Surgeon: Lucilla Lame, MD;  Location: [facility] ENDOSCOPY;  Service: Endoscopy;  Laterality: N/A;   COLONOSCOPY WITH PROPOFOL N/A 08/29/2017   Procedure: COLONOSCOPY WITH PROPOFOL;  Surgeon: Lucilla Lame, MD;  Location: [facility] ENDOSCOPY;  Service: Endoscopy;  Laterality: N/A;   CYSTOSCOPY W/ URETERAL STENT PLACEMENT Right 09/15/2017   Procedure: CYSTOSCOPY WITH RETROGRADE PYELOGRAM/URETERAL STENT PLACEMENT;  Surgeon: Cleon Gustin, MD;  Location: [facility] ORS;  Service: Urology;  Laterality: Right;   CYSTOSCOPY/URETEROSCOPY/HOLMIUM LASER/STENT PLACEMENT Right 10/09/2017   Procedure: CYSTOSCOPY/URETEROSCOPY/HOLMIUM LASER/STENT PLACEMENT;   Surgeon: Abbie Sons, MD;  Location: [facility] ORS;  Service: Urology;  Laterality: Right;  right Stent exchange   ESOPHAGOGASTRODUODENOSCOPY (EGD) WITH PROPOFOL N/A 09/16/2018   Procedure: ESOPHAGOGASTRODUODENOSCOPY (EGD) WITH PROPOFOL;  Surgeon: Lin Landsman, MD;  Location: [place];  Service: Gastroenterology;  Laterality: N/A;   EYE SURGERY      Prior to Admission medications   Medication Sig Start Date End Date Taking? Authorizing Provider  nitrofurantoin, macrocrystal-monohydrate, (MACROBID) 100 MG capsule Take 1 capsule (100 mg total) by mouth 2 (two) times daily for 7 days. 08/25/20 09/01/20 Yes Sable Feil, PA-C  acetaminophen (TYLENOL) 500 MG tablet Take 1-2 tablets (500-1,000 mg total) by mouth every 6 (six) hours as needed for mild pain or fever. Do not take more than 4 grams a day 05/25/20   Debbe Odea, MD  albuterol (VENTOLIN HFA) 108 (90 Base) MCG/ACT inhaler Inhale 2 puffs into the lungs every 6 (six) hours as needed for wheezing or shortness of breath.     [provider]  atorvastatin (LIPITOR) 10 MG tablet Take 10 mg by mouth daily.     [provider]  benzonatate (TESSALON) 100 MG  capsule Take 2 capsules (200 mg total) by mouth every 8 (eight) hours. 07/14/20   Margarette Canada, NP  ELIQUIS 5 MG TABS tablet Take 1 tablet by mouth twice daily 03/11/20   [name], MD  esomeprazole (NEXIUM) 40 MG capsule Take 40 mg by mouth daily. 12/17/18   [provider]  Ferrous Sulfate (IRON) 325 (65 Fe) MG TABS Take 1 tablet by mouth daily. 09/14/17   [provider]  fluticasone (FLONASE) 50 MCG/ACT nasal spray Place 2 sprays into both nostrils daily. 11/15/18   [provider]  Fluticasone-Umeclidin-Vilant 100-62.5-25 MCG/INH AEPB Inhale 1 puff into the lungs daily. 12/25/17   [provider]  furosemide (LASIX) 40 MG tablet Take 1 tablet (40 mg total) by mouth daily. 06/21/20 06/21/21  Val Riles, MD  insulin glargine  (LANTUS SOLOSTAR) 100 UNIT/ML Solostar Pen Inject 45 Units into the skin at bedtime. 05/25/20   Debbe Odea, MD  insulin lispro (HUMALOG KWIKPEN) 100 UNIT/ML KwikPen Inject 10 Units into the skin 3 (three) times daily. 05/25/20   Debbe Odea, MD  ipratropium (ATROVENT) 0.06 % nasal spray Place 2 sprays into both nostrils 4 (four) times daily. 05/28/20   Margarette Canada, NP  losartan (COZAAR) 50 MG tablet Take 1 tablet (50 mg total) by mouth daily. 06/22/20 09/20/20  Val Riles, MD  metFORMIN (GLUCOPHAGE) 500 MG tablet Take 1,000 mg by mouth daily.    [provider]  montelukast (SINGULAIR) 10 MG tablet Take 10 mg by mouth at bedtime.    [provider]  OZEMPIC, 0.25 OR 0.5 MG/DOSE, 2 MG/1.5ML SOPN SMARTSIG:0.375 Milliliter(s) SUB-Q Once a Week 05/12/20   [provider]  promethazine-dextromethorphan (PROMETHAZINE-DM) 6.25-15 MG/5ML syrup Take 5 mLs by mouth 4 (four) times daily as needed. 07/14/20   Margarette Canada, NP  vitamin B-12 (CYANOCOBALAMIN) 500 MCG tablet Take 500 mcg by mouth daily.    [provider]    Allergies Ace inhibitors, Beta adrenergic blockers, Gabapentin, Lisinopril, Lyrica [pregabalin], and Shrimp [shellfish allergy]  Family History  Problem Relation Age of Onset   Other Mother        unknown medical history   Other Father        unknown medical history    Social History Social History   Tobacco Use   Smoking status: Former    Packs/day: 1.00    Years: 20.00    Pack years: 20.00    Types: Cigarettes    Quit date: 12/04/1992    Years since quitting: 27.7   Smokeless tobacco: Never  Vaping Use   Vaping Use: Never used  Substance Use Topics   Alcohol use: No   Drug use: No    Review of Systems  Constitutional: No fever/chills Eyes: No visual changes. ENT: No sore throat. Cardiovascular: Denies chest pain. Respiratory: Denies shortness of breath. Gastrointestinal: No abdominal pain.  No nausea, no vomiting.  No  diarrhea.  No constipation. Genitourinary: Negative for dysuria.  Suspect urinary retention Musculoskeletal: Negative for back pain. Skin: Negative for rash. Neurological: Negative for headaches, focal weakness or numbness. Endocrine: Diabetes, hyperlipidemia, and hypertension Allergic/Immunilogical: ACE inhibitors, beta-blockers, gabapentin, lisinopril, Lyrica, and shellfish.  ____________________________________________   PHYSICAL EXAM:  VITAL SIGNS: ED Triage Vitals  Enc Vitals Group     BP 08/25/20 1155 136/67     Pulse Rate 08/25/20 1155 89     Resp 08/25/20 1155 20     Temp 08/25/20 1155 98.4 F (36.9 C)     Temp  Source 08/25/20 1155 Oral     SpO2 08/25/20 1155 96 %     Weight 08/25/20 1155 234 lb (106.1 kg)     Height 08/25/20 1155 '5\' 3"'$  (1.6 m)     Head Circumference --      Peak Flow --      Pain Score 08/25/20 1155 0     Pain Loc --      Pain Edu? --      Excl. in [place]? --     Constitutional: Alert and oriented. Well appearing and in no acute distress. Eyes: Conjunctivae are normal. PERRL. EOMI. Head: Atraumatic. Nose: No congestion/rhinnorhea. Mouth/Throat: Mucous membranes are moist.  Oropharynx non-erythematous. Neck: No stridor.  No cervical spine tenderness to palpation. Hematological/Lymphatic/Immunilogical: No cervical lymphadenopathy. Cardiovascular: Normal rate, regular rhythm. Grossly normal heart sounds.  Good peripheral circulation. Respiratory: Normal respiratory effort.  No retractions. Lungs CTAB. Gastrointestinal: Soft and nontender. No distention. No abdominal bruits. No CVA tenderness. Genitourinary: Deferred usculoskeletal: No lower extremity tenderness nor edema.  No joint effusions. Neurologic:  Normal speech and language. No gross focal neurologic deficits are appreciated. No gait instability. Skin:  Skin is warm, dry and intact. No rash noted. Psychiatric: Mood and affect are normal. Speech and behavior are  normal.  ____________________________________________   LABS (all labs ordered are listed, but only abnormal results are displayed)  Labs Reviewed  URINALYSIS, COMPLETE (UACMP) WITH MICROSCOPIC - Abnormal; Notable for the following components:      Result Value   Color, Urine STRAW (*)    APPearance CLEAR (*)    Glucose, UA >=500 (*)    Leukocytes,Ua MODERATE (*)    Bacteria, UA RARE (*)    All other components within normal limits  URINE CULTURE  CBG MONITORING, ED   ____________________________________________  EKG   ____________________________________________  RADIOLOGY I, Sable Feil, personally viewed and evaluated these images (plain radiographs) as part of my medical decision making, as well as reviewing the written report by the radiologist.  ED MD interpretation:    Official radiology report(s): No results found.  ____________________________________________   PROCEDURES  Procedure(s) performed (including Critical Care):  Procedures   ____________________________________________   INITIAL IMPRESSION / ASSESSMENT AND PLAN / ED COURSE  As part of my medical decision making, I reviewed the following data within the [place]         Patient presents for suspected urinary retention due to decreased voiding.  Patient reported to give a sample for urinalysis consistent with UTI.  Patient bladder scan after voiding was revealed an empty bladder.  Was concerned about patient glucose being over 500 on a urinalysis however a libre glucometer reading was 217.  Patient was started antibiotics pending urine culture.  Advised outpcp.       ____________________________________________   FINAL CLINICAL IMPRESSION(S) / ED DIAGNOSES  Final diagnoses:  Lower urinary tract infectious disease     ED Discharge Orders          Ordered    nitrofurantoin, macrocrystal-monohydrate, (MACROBID) 100 MG capsule  2 times daily        08/25/20  1428             Note:  This document was prepared using Dragon voice recognition software and may include unintentional dictation errors.    Sable Feil, PA-C 08/25/20 1429    Blake Divine, MD 08/25/20 [phone]

## 2020-08-25 NOTE — Discharge Instructions (Addendum)
Bladder scan was unremarkable.  Urine culture is pending.  Read and follow discharge care instructions.  Take medication as directed.  Follow-up with PCP.

## 2020-08-25 NOTE — ED Triage Notes (Signed)
Pt comes into the ED via POV c/o urinary retention.  Pt states that she hasnt been "urinating like normal".  Pt denies any urinary discomfort. PT denies any lower abdominal pressure or back pain.  Pt states last ruination was last night.  PT chronically wears 3L O2.  Pt states she has CHF, but denies any edema or increased SHOB.  Pt only c/o urinary retention.

## 2020-08-25 NOTE — ED Notes (Signed)
Patient reports the urge to urinate, without producing urine x 3 days. Patient reports urine leakage as well. Patient arrives with a brief in place.

## 2020-08-25 NOTE — ED Notes (Signed)
Patient has indwelling blood glucose monitor. Monitor reading 217. PA notified.

## 2020-08-25 NOTE — ED Notes (Signed)
Patient noted to have urinated on her pants. Patient provided disposable scrub pants. Patient repositioned in bed.

## 2020-08-26 LAB — URINE CULTURE: Culture: 60000 — AB

## 2020-09-01 ENCOUNTER — Telehealth (HOSPITAL_COMMUNITY): Payer: Self-pay

## 2020-09-01 NOTE — Telephone Encounter (Signed)
Today had a visit with Karen [name].  She states doing ok.  She states ortho tried to get her some meds to help with pain but cardiology does not want her to take it with her eliquis.  We talked about why.  She states pain is not too bad today.  She states UTI is better.  She denies having any problems such as chest pain, headaches or dizziness.  Denies increased shortness of breath.  She has all her medications, verified them.  She is aware of how to take them.  She has everything for daily living and denies needing anything.  Will try to make a home visit next month, she agreed to one.  Will visit for heart failure.   [place] [phone]

## 2020-09-27 ENCOUNTER — Telehealth (HOSPITAL_COMMUNITY): Payer: Self-pay

## 2020-09-27 NOTE — Telephone Encounter (Signed)
Attempted to contact, no answer.   Left message.    Daralyn Bert Rogers EMT-Paramedic [phone] 

## 2020-09-30 ENCOUNTER — Other Ambulatory Visit (HOSPITAL_COMMUNITY): Payer: Self-pay

## 2020-09-30 NOTE — Progress Notes (Signed)
Was able to contact [place] today. She states UTI is gone.  She states feeling good today.  Set up a home visit for next week.  She denies needing anything.  She denies chest pain, headaches, dizziness or increased shortness of breath.  She states she does not need anything.  She states has all her medications and she is aware of how to take them.  Will visit for heart failure, diet and medication compliance.   [place] [phone]

## 2020-10-06 ENCOUNTER — Other Ambulatory Visit (HOSPITAL_COMMUNITY): Payer: Self-pay

## 2020-10-06 ENCOUNTER — Encounter (HOSPITAL_COMMUNITY): Payer: Self-pay

## 2020-10-06 NOTE — Progress Notes (Signed)
BGL: 237  Today had a home visit with Karen [name].  She states doing ok, she states never did get a referral from her PCP for Palliative Care, she is still interested in it.  Her husband is not home, she was ambulatory when I arrived with rollator.  She appears to be in a good mood. She has a all her medications and aware of how to take them.  Verified her meds.  She is aware of up coming appts.  She has everything she needs for daily living.  Her husband is her care taker.  They would like help in the home, they have not received it yet.  She states she is just getting along, no good days but no bad days.  She states UTI appears to be gone.  Her sugars are running better but still high.  She denies any problems such as chest pain, headaches, dizziness or increased shortness of breath.  The edema in her legs are down. She does not wear ted hose or socks.  She mostly stays home except for appts.  Her husband does the shopping and cooking.  She has a son that lives with them but does not help them.  She has a daughter in town that will be staying close by for awhile.  She talked about that for a bit, she is from [place].  Will continue to visit for heart failure, diet and medication compliance.   [place] [phone]

## 2020-10-11 ENCOUNTER — Other Ambulatory Visit: Payer: Self-pay

## 2020-10-11 ENCOUNTER — Emergency Department: Payer: Medicare Other

## 2020-10-11 ENCOUNTER — Emergency Department
Admission: EM | Admit: 2020-10-11 | Discharge: 2020-10-11 | Disposition: A | Discharge status: Home / Self Care | Payer: Medicare Other | Attending: Emergency Medicine | Admitting: Emergency Medicine

## 2020-10-11 ENCOUNTER — Encounter: Payer: Self-pay | Admitting: Intensive Care

## 2020-10-11 DIAGNOSIS — Z79899 Other long term (current) drug therapy: Secondary | ICD-10-CM | POA: Diagnosis not present

## 2020-10-11 DIAGNOSIS — Z7984 Long term (current) use of oral hypoglycemic drugs: Secondary | ICD-10-CM | POA: Diagnosis not present

## 2020-10-11 DIAGNOSIS — N3000 Acute cystitis without hematuria: Secondary | ICD-10-CM | POA: Insufficient documentation

## 2020-10-11 DIAGNOSIS — I130 Hypertensive heart and chronic kidney disease with heart failure and stage 1 through stage 4 chronic kidney disease, or unspecified chronic kidney disease: Secondary | ICD-10-CM | POA: Diagnosis not present

## 2020-10-11 DIAGNOSIS — Z794 Long term (current) use of insulin: Secondary | ICD-10-CM | POA: Insufficient documentation

## 2020-10-11 DIAGNOSIS — Z87891 Personal history of nicotine dependence: Secondary | ICD-10-CM | POA: Diagnosis not present

## 2020-10-11 DIAGNOSIS — N184 Chronic kidney disease, stage 4 (severe): Secondary | ICD-10-CM | POA: Insufficient documentation

## 2020-10-11 DIAGNOSIS — E1122 Type 2 diabetes mellitus with diabetic chronic kidney disease: Secondary | ICD-10-CM | POA: Diagnosis not present

## 2020-10-11 DIAGNOSIS — J449 Chronic obstructive pulmonary disease, unspecified: Secondary | ICD-10-CM | POA: Insufficient documentation

## 2020-10-11 DIAGNOSIS — Z7901 Long term (current) use of anticoagulants: Secondary | ICD-10-CM | POA: Insufficient documentation

## 2020-10-11 DIAGNOSIS — E1140 Type 2 diabetes mellitus with diabetic neuropathy, unspecified: Secondary | ICD-10-CM | POA: Diagnosis not present

## 2020-10-11 DIAGNOSIS — J45909 Unspecified asthma, uncomplicated: Secondary | ICD-10-CM | POA: Insufficient documentation

## 2020-10-11 DIAGNOSIS — R3912 Poor urinary stream: Secondary | ICD-10-CM | POA: Diagnosis present

## 2020-10-11 DIAGNOSIS — I5031 Acute diastolic (congestive) heart failure: Secondary | ICD-10-CM | POA: Insufficient documentation

## 2020-10-11 LAB — COMPREHENSIVE METABOLIC PANEL
ALT: 11 U/L (ref 0–44)
AST: 17 U/L (ref 15–41)
Albumin: 3.6 g/dL (ref 3.5–5.0)
Alkaline Phosphatase: 70 U/L (ref 38–126)
Anion gap: 13 (ref 5–15)
BUN: 29 mg/dL — ABNORMAL HIGH (ref 8–23)
CO2: 32 mmol/L (ref 22–32)
Calcium: 9.4 mg/dL (ref 8.9–10.3)
Chloride: 86 mmol/L — ABNORMAL LOW (ref 98–111)
Creatinine, Ser: 1.18 mg/dL — ABNORMAL HIGH (ref 0.44–1.00)
GFR, Estimated: 48 mL/min — ABNORMAL LOW (ref >60)
Glucose, Bld: 207 mg/dL — ABNORMAL HIGH (ref 70–99)
Potassium: 4.2 mmol/L (ref 3.5–5.1)
Sodium: 131 mmol/L — ABNORMAL LOW (ref 135–145)
Total Bilirubin: 1 mg/dL (ref 0.3–1.2)
Total Protein: 7 g/dL (ref 6.5–8.1)

## 2020-10-11 LAB — URINALYSIS, COMPLETE (UACMP) WITH MICROSCOPIC
Bilirubin Urine: NEGATIVE
Glucose, UA: NEGATIVE mg/dL
Ketones, ur: NEGATIVE mg/dL
Nitrite: NEGATIVE
Protein, ur: NEGATIVE mg/dL
Specific Gravity, Urine: 1.006 (ref 1.005–1.030)
pH: 6 (ref 5.0–8.0)

## 2020-10-11 LAB — CBC
HCT: 31.2 % — ABNORMAL LOW (ref 36.0–46.0)
Hemoglobin: 9.9 g/dL — ABNORMAL LOW (ref 12.0–15.0)
MCH: 26.7 pg (ref 26.0–34.0)
MCHC: 31.7 g/dL (ref 30.0–36.0)
MCV: 84.1 fL (ref 80.0–100.0)
Platelets: 304 10*3/uL (ref 150–400)
RBC: 3.71 MIL/uL — ABNORMAL LOW (ref 3.87–5.11)
RDW: 14.4 % (ref 11.5–15.5)
WBC: 15.4 10*3/uL — ABNORMAL HIGH (ref 4.0–10.5)
nRBC: 0 % (ref 0.0–0.2)

## 2020-10-11 LAB — LIPASE, BLOOD: Lipase: 17 U/L (ref 11–51)

## 2020-10-11 MED ORDER — IOHEXOL 350 MG/ML SOLN
80.0000 mL | Freq: Once | INTRAVENOUS | Status: AC | PRN
  Administered 2020-10-11: 80 mL via INTRAVENOUS

## 2020-10-11 MED ORDER — FOSFOMYCIN TROMETHAMINE 3 G PO PACK
3.0000 g | PACK | Freq: Once | ORAL | Status: AC
  Administered 2020-10-11: 3 g via ORAL
  Filled 2020-10-11: qty 3

## 2020-10-11 NOTE — Discharge Instructions (Addendum)
Follow up with primary care for symptoms that are not improving over the next few days.  Return to the ER for symptoms that change or worsen if unable to schedule an appointment.

## 2020-10-11 NOTE — ED Notes (Signed)
Urine specimen collected and sent to lab

## 2020-10-11 NOTE — ED Provider Notes (Signed)
[facility] Emergency Department Provider Note ____________________________________________   Event Date/Time   First MD Initiated Contact with Patient 10/11/20 1119     (approximate)  I have reviewed the triage vital signs and the nursing notes.   HISTORY  Chief Complaint Urinary Frequency and Abdominal Pain  HPI Karen [name] is a 75 y.o. female with history of CHF, diabetes, ARDS, AKI, and remaining history as listed below presents to the emergency department for treatment and evaluation of decreased urinary output for the past 2 days.         Past Medical History:  Diagnosis Date   (HFpEF) heart failure with preserved ejection fraction ([place])    a. 2017 Echo: EF 50%; b. 06/2018 Echo: EF 50-55%; c. 08/2018 Echo: EF 50-55%, Nl RV fxn; d. 09/2019 Echo: EF 55-60%, no rwma, mild LVH, Gr1 DD, nl RV size/fxn, PASP 63.76mHg. Mildly dil LA. Triv MR. Mod AS (AoV 0.94cm^2 VTI; mean grad 17.3556mg).   Acute on chronic respiratory failure with hypoxia and hypercapnia (HCC) 01/07/2015   Anemia    Asterixis 01/07/2015   Asthma    Cataract    CKD (chronic kidney disease), stage III (HCC)    COPD (chronic obstructive pulmonary disease) ([place]   a. 06/2018 tobacco use, home 3L oxygen    Diabetes mellitus without complication (HCEllerbe   a. 09/2019 A1C 8.8   Edema, peripheral 04/20/2014   GI bleed 06/28/2019   History of kidney stones    Hyperlipidemia    Hypertension    Iron deficiency anemia 06/22/2014   Junctional bradycardia    a. In setting of beta blocker therapy.   Leucocytosis 10/19/2015   Moderate aortic stenosis    a.  09/2019 Echo: Mod AS (AoV 0.94cm^2 VTI; mean grad 17.56m51m).   Morbid obesity (HCCWay[place]  Overactive bladder    Primary osteoarthritis of right knee 09/01/2016   Sciatica 01/07/2015    Patient Active Problem List   Diagnosis Date Noted   Aortic stenosis, moderate    AF (paroxysmal atrial fibrillation) (HCC)    Gastroesophageal reflux disease  without esophagitis    Acute CHF (congestive heart failure) (HCCAddison4/14/2022   Chronic respiratory failure with hypercapnia (HCCGordon3/18/2022   Severe sepsis (HCCRose [place]3/18/2022   UTI (urinary tract infection) 05/21/2020   Hyperglycemia due to type 2 diabetes mellitus (HCCGreen[place] West3/18/2022   Abnormal CT of liver 05/21/2020   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 05/21/2020   Morbid obesity (HCCHammonton3/07/2020   Acute hip pain, left 10/23/2019   Lumbar stenosis with neurogenic claudication 10/23/2019   COPD with acute exacerbation (HCCFolsom7/123456Acute diastolic CHF (congestive heart failure) (HCCRome7/25/2021   (HFpEF) heart failure with preserved ejection fraction (HCCShok[place]7/24/2021   Rectal bleeding 06/28/2019   Hypokalemia 06/28/2019   Hyponatremia 06/28/2019   Type 2 diabetes mellitus with hyperlipidemia (HCCCliff4/24/2021   CKD (chronic kidney disease), stage IIIa 06/28/2019   Atrial fibrillation, chronic (HCCNew London4/24/2021   Pulmonary edema 02/27/2019   Bradycardia 02/24/2019   Chronic respiratory failure with hypoxia (HCCHudson2/20/2020   Atypical chest pain 02/13/2019   Osteopenia of neck of left femur 11/20/2018   AVM (arteriovenous malformation) of small bowel, acquired    Acute gastric ulcer with hemorrhage    Chronic diastolic heart failure (HCCPrairie Grove6/18/2020   Diarrhea 08/22/2018   Junctional bradycardia    Acute on chronic heart failure with preserved ejection fraction (HFpEF) (HCCWest Rushville  AKI (  acute kidney injury) ([place])    Symptomatic bradycardia 08/05/2018   Acute on chronic respiratory failure ([place]) 06/25/2018   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 diabetes mellitus ([place]) 01/25/2018   History of non anemic vitamin B12 deficiency 01/25/2018   Personal history of kidney stones 11/12/2017   Urge incontinence 11/12/2017   History of leukocytosis 09/17/2017   Right ureteral stone 09/15/2017   Acute GI bleeding    GI bleed 08/26/2017   Arthritis 08/10/2017   Stage 4  chronic kidney disease ([place]) 08/10/2017   COPD (chronic obstructive pulmonary disease) ([place]) 08/10/2017   Diabetes mellitus type 2, uncomplicated ([place]) 0000000   Hypertension 08/10/2017   Obesity (BMI 35.0-39.9 without comorbidity) 04/11/2017   Primary osteoarthritis of right knee 09/01/2016   Leucocytosis 10/19/2015   Asterixis 01/07/2015   Acute on chronic respiratory failure with hypoxia and hypercapnia (HCC) 01/07/2015   Sciatica 01/07/2015   Weakness 01/07/2015   Chronic 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 01/04/2015   Iron deficiency anemia 06/22/2014   Microalbuminuria 06/22/2014   CHF (congestive heart failure) ([place]) 04/20/2014   Edema, peripheral 04/20/2014    Past Surgical History:  Procedure Laterality Date   APPENDECTOMY     CESAREAN SECTION     x3   CHOLECYSTECTOMY     COLONOSCOPY WITH PROPOFOL N/A 08/28/2017   Procedure: COLONOSCOPY WITH PROPOFOL;  Surgeon: Lucilla Lame, MD;  Location[facility]COPY;  Service: Endoscopy;  Laterality: N/A;   COLONOSCOPY WITH PROPOFOL N/A 08/29/2017   Procedure: COLONOSCOPY WITH PROPOFOL;  Surgeon: Lucilla Lame, MD;  Location: [facility] ENDOSCOPY;  Service: Endoscopy;  Laterality: N/A;   CYSTOSCOPY W/ URETERAL STENT PLACEMENT Right 09/15/2017   Procedure: CYSTOSCOPY WITH RETROGRADE PYELOGRAM/URETERAL STENT PLACEMENT;  Surgeon: Cleon Gustin, MD;  Location: [facility] ORS;  Service: Urology;  Laterality: Right;   CYSTOSCOPY/URETEROSCOPY/HOLMIUM LASER/STENT PLACEMENT Right 10/09/2017   Procedure: CYSTOSCOPY/URETEROSCOPY/HOLMIUM LASER/STENT PLACEMENT;  Surgeon: Abbie Sons, MD;  Location: [facility] ORS;  Service: Urology;  Laterality: Right;  right Stent exchange   ESOPHAGOGASTRODUODENOSCOPY (EGD) WITH PROPOFOL N/A 09/16/2018   Procedure: ESOPHAGOGASTRODUODENOSCOPY (EGD) WITH PROPOFOL;  Surgeon: Lin Landsman, MD;  Location: [facility];  Service: Gastroenterology;  Laterality: N/A;   EYE SURGERY      Prior to Admission  medications   Medication Sig Start Date End Date Taking? Authorizing Provider  acetaminophen (TYLENOL) 500 MG tablet Take 1-2 tablets (500-1,000 mg total) by mouth every 6 (six) hours as needed for mild pain or fever. Do not take more than 4 grams a day 05/25/20   Debbe Odea, MD  albuterol (VENTOLIN HFA) 108 (90 Base) MCG/ACT inhaler Inhale 2 puffs into the lungs every 6 (six) hours as needed for wheezing or shortness of breath.     [provider]  atorvastatin (LIPITOR) 10 MG tablet Take 10 mg by mouth daily.     [provider]  benzonatate (TESSALON) 100 MG capsule Take 2 capsules (200 mg total) by mouth every 8 (eight) hours. 07/14/20   Margarette Canada, NP  ELIQUIS 5 MG TABS tablet Take 1 tablet by mouth twice daily 03/11/20   [name], MD  esomeprazole (NEXIUM) 40 MG capsule Take 40 mg by mouth daily. 12/17/18   [provider]  Ferrous Sulfate (IRON) 325 (65 Fe) MG TABS Take 1 tablet by mouth daily. 09/14/17   [provider]  fluticasone (FLONASE) 50 MCG/ACT nasal spray Place 2 sprays into both nostrils daily. 11/15/18   [provider]  Fluticasone-Umeclidin-Vilant 100-62.5-25  MCG/INH AEPB Inhale 1 puff into the lungs daily. 12/25/17   [provider]  furosemide (LASIX) 40 MG tablet Take 1 tablet (40 mg total) by mouth daily. 06/21/20 06/21/21  Val Riles, MD  insulin glargine (LANTUS SOLOSTAR) 100 UNIT/ML Solostar Pen Inject 45 Units into the skin at bedtime. 05/25/20   Debbe Odea, MD  insulin lispro (HUMALOG KWIKPEN) 100 UNIT/ML KwikPen Inject 10 Units into the skin 3 (three) times daily. 05/25/20   Debbe Odea, MD  ipratropium (ATROVENT) 0.06 % nasal spray Place 2 sprays into both nostrils 4 (four) times daily. 05/28/20   Margarette Canada, NP  losartan (COZAAR) 50 MG tablet Take 1 tablet (50 mg total) by mouth daily. 06/22/20 10/06/20  Val Riles, MD  metFORMIN (GLUCOPHAGE) 500 MG tablet Take 1,000 mg by mouth daily.    [provider]  montelukast (SINGULAIR) 10 MG tablet Take 10 mg by mouth at bedtime.    [provider]  OZEMPIC, 0.25 OR 0.5 MG/DOSE, 2 MG/1.5ML SOPN SMARTSIG:0.375 Milliliter(s) SUB-Q Once a Week 05/12/20   [provider]  promethazine-dextromethorphan (PROMETHAZINE-DM) 6.25-15 MG/5ML syrup Take 5 mLs by mouth 4 (four) times daily as needed. 07/14/20   Margarette Canada, NP  vitamin B-12 (CYANOCOBALAMIN) 500 MCG tablet Take 500 mcg by mouth daily.    [provider]    Allergies Ace inhibitors, Beta adrenergic blockers, Gabapentin, Lisinopril, Lyrica [pregabalin], and Shrimp [shellfish allergy]  Family History  Problem Relation Age of Onset   Other Mother        unknown medical history   Other Father        unknown medical history    Social History Social History   Tobacco Use   Smoking status: Former    Packs/day: 1.00    Years: 20.00    Pack years: 20.00    Types: Cigarettes    Quit date: 12/04/1992    Years since quitting: 27.8   Smokeless tobacco: Never  Vaping Use   Vaping Use: Never used  Substance Use Topics   Alcohol use: No   Drug use: No    Review of Systems  Constitutional: No fever/chills Eyes: No visual changes. ENT: No sore throat. Cardiovascular: Denies chest pain. Respiratory: Denies shortness of breath. Gastrointestinal: Positive abdominal pain.  No nausea, no vomiting.  No diarrhea.  No constipation. Genitourinary: Positive for dysuria.  Positive for urinary frequency. Musculoskeletal: Negative for back pain. Skin: Negative for rash. Neurological: Negative for headaches, focal weakness or numbness.  ____________________________________________   PHYSICAL EXAM:  VITAL SIGNS: ED Triage Vitals  Enc Vitals Group     BP 10/11/20 1049 (!) 126/56     Pulse Rate 10/11/20 1049 94     Resp 10/11/20 1049 (!) 22     Temp 10/11/20 1049 98.7 F (37.1 C)     Temp Source 10/11/20 1049 Oral     SpO2 10/11/20 1049 94 %     Weight  10/11/20 1052 225 lb (102.1 kg)     Height 10/11/20 1052 '5\' 3"'$  (1.6 m)     Head Circumference --      Peak Flow --      Pain Score 10/11/20 1051 9     Pain Loc --      Pain Edu? --      Excl. in [place]? --     Constitutional: Alert and oriented. Well appearing and in no acute distress. Eyes: Conjunctivae are normal.  Head: Atraumatic. Nose: No congestion/rhinnorhea. Mouth/Throat: Mucous membranes  are moist.   Neck: No stridor.   Hematological/Lymphatic/Immunilogical: No cervical lymphadenopathy. Cardiovascular: Normal rate, regular rhythm. Grossly normal heart sounds.  Good peripheral circulation. Respiratory: Normal respiratory effort.  No retractions. Lungs CTAB. Gastrointestinal: Soft and tender right mid abdomen. No distention. No abdominal bruits. No CVA tenderness. Genitourinary:  Musculoskeletal: No lower extremity tenderness nor edema.  No joint effusions. Neurologic:  Normal speech and language. No gross focal neurologic deficits are appreciated. No gait instability. Skin:  Skin is warm, dry and intact. No rash noted. Psychiatric: Mood and affect are normal. Speech and behavior are normal.  ____________________________________________   LABS (all labs ordered are listed, but only abnormal results are displayed)  Labs Reviewed  COMPREHENSIVE METABOLIC PANEL - Abnormal; Notable for the following components:      Result Value   Sodium 131 (*)    Chloride 86 (*)    Glucose, Bld 207 (*)    BUN 29 (*)    Creatinine, Ser 1.18 (*)    GFR, Estimated 48 (*)    All other components within normal limits  CBC - Abnormal; Notable for the following components:   WBC 15.4 (*)    RBC 3.71 (*)    Hemoglobin 9.9 (*)    HCT 31.2 (*)    All other components within normal limits  URINALYSIS, COMPLETE (UACMP) WITH MICROSCOPIC - Abnormal; Notable for the following components:   Color, Urine YELLOW (*)    APPearance CLEAR (*)    Hgb urine dipstick SMALL (*)    Leukocytes,Ua SMALL (*)     Bacteria, UA RARE (*)    All other components within normal limits  LIPASE, BLOOD   ____________________________________________  EKG  Not indicated ____________________________________________  RADIOLOGY  ED MD interpretation:    CT abdomen and pelvis without acute concerns.  I, Sherrie George, personally viewed and evaluated these images (plain radiographs) as part of my medical decision making, as well as reviewing the written report by the radiologist.  Official radiology report(s): CT ABDOMEN PELVIS W CONTRAST  Result Date: 10/11/2020 CLINICAL DATA:  Right mid and lower quadrant abdominal pain for 3 days. Nausea. EXAM: CT ABDOMEN AND PELVIS WITH CONTRAST TECHNIQUE: Multidetector CT imaging of the abdomen and pelvis was performed using the standard protocol following bolus administration of intravenous contrast. CONTRAST:  21m OMNIPAQUE IOHEXOL 350 MG/ML SOLN COMPARISON:  Noncontrast CT on 05/21/2020 FINDINGS: Lower Chest: No acute findings. Hepatobiliary: No hepatic masses identified. Hypertrophy of left hepatic lobe again noted, and cirrhosis cannot definitely be excluded. Prior cholecystectomy. No evidence of biliary obstruction. Pancreas:  No mass or inflammatory changes. Spleen: Within normal limits in size and appearance. Adrenals/Urinary Tract: No masses identified. No evidence of ureteral calculi or hydronephrosis. Stomach/Bowel: No evidence of obstruction, inflammatory process or abnormal fluid collections. Diffuse colonic diverticulosis is noted, however there is no evidence of diverticulitis. Although the appendix is not directly visualized, no inflammatory process seen in region of the cecum or elsewhere. Vascular/Lymphatic: No pathologically enlarged lymph nodes. No acute vascular findings. Aortic atherosclerotic calcification noted. Reproductive:  No mass or other significant abnormality. Other:  None. Musculoskeletal:  No suspicious bone lesions identified. IMPRESSION: No  acute findings. Colonic diverticulosis, without radiographic evidence of diverticulitis. Aortic Atherosclerosis (ICD10-I70.0). Electronically Signed   By: JMarlaine HindM.D.   On: 10/11/2020 13:08    ____________________________________________   PROCEDURES  Procedure(s) performed (including Critical Care):  Procedures  ____________________________________________   INITIAL IMPRESSION / ASSESSMENT AND PLAN     75year old female from  home presenting to the emergency department for treatment and evaluation of decreased urinary output with abdominal pain.  See HPI for further details.  Awaiting results of urinalysis and screening labs.  DIFFERENTIAL DIAGNOSIS  Acute cystitis, pyelonephritis, bowel disorder  ED COURSE   Clinical Course as of 10/11/20 1402  Mon Oct 11, 2020  1134 Urinalysis shows small amount hemoglobin, small amount leukocytes, rare bacteria.  CT of the abdomen and pelvis is pending.  White blood cell count 15.4 with a stable hemoglobin and hematocrit in comparison to previous.  Otherwise, labs are overall reassuring. [CT]  1329 CT without acute concerns. Results reviewed with patient. Plan will be to treat with fosfomycin and have her follow-up with her primary care provider within the next couple of days if not improving.  She will be encouraged to return to the emergency department for symptoms of change or worsen if unable to schedule an appointment. [CT]    Clinical Course User Index [CT] Kately Graffam, Dessa Phi, FNP   ___________________________________________   FINAL CLINICAL IMPRESSION(S) / ED DIAGNOSES  Final diagnoses:  Acute cystitis without hematuria     ED Discharge Orders     None        Karen [name] was evaluated in Emergency Department on 10/11/2020 for the symptoms described in the history of present illness. She was evaluated in the context of the global COVID-19 pandemic, which necessitated consideration that the patient might be at risk  for infection with the SARS-CoV-2 virus that causes COVID-19. Institutional protocols and algorithms that pertain to the evaluation of patients at risk for COVID-19 are in a state of rapid change based on information released by regulatory bodies including the CDC and federal and state organizations. These policies and algorithms were followed during the patient's care in the ED.   Note:  This document was prepared using Dragon voice recognition software and may include unintentional dictation errors.    Victorino Dike, FNP 10/11/20 1402    Carrie Mew, MD 10/11/20 [phone]

## 2020-10-11 NOTE — ED Triage Notes (Signed)
Patient reports little output of urine and abdominal pain that started a couple days ago. Wears 3L O2 continuously

## 2020-10-11 NOTE — ED Notes (Signed)
Patient placed on monitor.  Purewick applied to patient.  Patient aware of need for urine specimen collection.

## 2020-10-22 ENCOUNTER — Other Ambulatory Visit: Payer: Self-pay | Admitting: Cardiovascular Disease

## 2020-10-25 NOTE — Telephone Encounter (Signed)
Prescription refill request for Eliquis received. Indication:afib Last office visit:[place] 05/31/20 Scr:1.18 10/11/20 Age: 67fWeight:97.1kg

## 2020-10-25 NOTE — Telephone Encounter (Signed)
Refill Request.  

## 2020-10-29 ENCOUNTER — Encounter: Payer: Self-pay | Admitting: Emergency Medicine

## 2020-10-29 ENCOUNTER — Emergency Department: Payer: Medicare Other

## 2020-10-29 ENCOUNTER — Inpatient Hospital Stay
Admission: EM | Admit: 2020-10-29 | Discharge: 2020-11-09 | DRG: 177 | Disposition: A | Discharge status: Skilled Nursing Facility | Payer: Medicare Other | Attending: Family Medicine | Admitting: Family Medicine

## 2020-10-29 ENCOUNTER — Other Ambulatory Visit: Payer: Self-pay

## 2020-10-29 DIAGNOSIS — T380X5A Adverse effect of glucocorticoids and synthetic analogues, initial encounter: Secondary | ICD-10-CM | POA: Diagnosis not present

## 2020-10-29 DIAGNOSIS — J90 Pleural effusion, not elsewhere classified: Secondary | ICD-10-CM

## 2020-10-29 DIAGNOSIS — Z7901 Long term (current) use of anticoagulants: Secondary | ICD-10-CM

## 2020-10-29 DIAGNOSIS — Z91013 Allergy to seafood: Secondary | ICD-10-CM

## 2020-10-29 DIAGNOSIS — K219 Gastro-esophageal reflux disease without esophagitis: Secondary | ICD-10-CM | POA: Diagnosis present

## 2020-10-29 DIAGNOSIS — N183 Chronic kidney disease, stage 3 unspecified: Secondary | ICD-10-CM | POA: Diagnosis present

## 2020-10-29 DIAGNOSIS — U071 COVID-19: Principal | ICD-10-CM | POA: Diagnosis present

## 2020-10-29 DIAGNOSIS — Z87442 Personal history of urinary calculi: Secondary | ICD-10-CM

## 2020-10-29 DIAGNOSIS — I480 Paroxysmal atrial fibrillation: Secondary | ICD-10-CM | POA: Diagnosis present

## 2020-10-29 DIAGNOSIS — J1282 Pneumonia due to coronavirus disease 2019: Secondary | ICD-10-CM | POA: Diagnosis not present

## 2020-10-29 DIAGNOSIS — D72829 Elevated white blood cell count, unspecified: Secondary | ICD-10-CM | POA: Diagnosis present

## 2020-10-29 DIAGNOSIS — J449 Chronic obstructive pulmonary disease, unspecified: Secondary | ICD-10-CM | POA: Diagnosis present

## 2020-10-29 DIAGNOSIS — E1165 Type 2 diabetes mellitus with hyperglycemia: Secondary | ICD-10-CM | POA: Diagnosis not present

## 2020-10-29 DIAGNOSIS — R296 Repeated falls: Secondary | ICD-10-CM | POA: Diagnosis present

## 2020-10-29 DIAGNOSIS — Z9049 Acquired absence of other specified parts of digestive tract: Secondary | ICD-10-CM

## 2020-10-29 DIAGNOSIS — J441 Chronic obstructive pulmonary disease with (acute) exacerbation: Secondary | ICD-10-CM | POA: Diagnosis present

## 2020-10-29 DIAGNOSIS — Z6839 Body mass index (BMI) 39.0-39.9, adult: Secondary | ICD-10-CM

## 2020-10-29 DIAGNOSIS — E1169 Type 2 diabetes mellitus with other specified complication: Secondary | ICD-10-CM | POA: Diagnosis present

## 2020-10-29 DIAGNOSIS — I5030 Unspecified diastolic (congestive) heart failure: Secondary | ICD-10-CM | POA: Diagnosis present

## 2020-10-29 DIAGNOSIS — Z86718 Personal history of other venous thrombosis and embolism: Secondary | ICD-10-CM

## 2020-10-29 DIAGNOSIS — R531 Weakness: Secondary | ICD-10-CM

## 2020-10-29 DIAGNOSIS — Z79899 Other long term (current) drug therapy: Secondary | ICD-10-CM

## 2020-10-29 DIAGNOSIS — E785 Hyperlipidemia, unspecified: Secondary | ICD-10-CM | POA: Diagnosis present

## 2020-10-29 DIAGNOSIS — Z794 Long term (current) use of insulin: Secondary | ICD-10-CM

## 2020-10-29 DIAGNOSIS — I5032 Chronic diastolic (congestive) heart failure: Secondary | ICD-10-CM | POA: Diagnosis present

## 2020-10-29 DIAGNOSIS — M1711 Unilateral primary osteoarthritis, right knee: Secondary | ICD-10-CM | POA: Diagnosis present

## 2020-10-29 DIAGNOSIS — R918 Other nonspecific abnormal finding of lung field: Secondary | ICD-10-CM | POA: Diagnosis present

## 2020-10-29 DIAGNOSIS — I130 Hypertensive heart and chronic kidney disease with heart failure and stage 1 through stage 4 chronic kidney disease, or unspecified chronic kidney disease: Secondary | ICD-10-CM | POA: Diagnosis present

## 2020-10-29 DIAGNOSIS — R55 Syncope and collapse: Secondary | ICD-10-CM | POA: Diagnosis present

## 2020-10-29 DIAGNOSIS — I2510 Atherosclerotic heart disease of native coronary artery without angina pectoris: Secondary | ICD-10-CM | POA: Diagnosis present

## 2020-10-29 DIAGNOSIS — N179 Acute kidney failure, unspecified: Secondary | ICD-10-CM | POA: Diagnosis present

## 2020-10-29 DIAGNOSIS — M79642 Pain in left hand: Secondary | ICD-10-CM | POA: Diagnosis present

## 2020-10-29 DIAGNOSIS — D631 Anemia in chronic kidney disease: Secondary | ICD-10-CM | POA: Insufficient documentation

## 2020-10-29 DIAGNOSIS — Z888 Allergy status to other drugs, medicaments and biological substances status: Secondary | ICD-10-CM

## 2020-10-29 DIAGNOSIS — Z87891 Personal history of nicotine dependence: Secondary | ICD-10-CM

## 2020-10-29 DIAGNOSIS — Z6838 Body mass index (BMI) 38.0-38.9, adult: Secondary | ICD-10-CM | POA: Diagnosis present

## 2020-10-29 DIAGNOSIS — W1839XA Other fall on same level, initial encounter: Secondary | ICD-10-CM | POA: Diagnosis present

## 2020-10-29 DIAGNOSIS — S0011XA Contusion of right eyelid and periocular area, initial encounter: Secondary | ICD-10-CM | POA: Diagnosis present

## 2020-10-29 DIAGNOSIS — J440 Chronic obstructive pulmonary disease with acute lower respiratory infection: Secondary | ICD-10-CM | POA: Diagnosis present

## 2020-10-29 DIAGNOSIS — I350 Nonrheumatic aortic (valve) stenosis: Secondary | ICD-10-CM | POA: Diagnosis present

## 2020-10-29 DIAGNOSIS — N1831 Chronic kidney disease, stage 3a: Secondary | ICD-10-CM | POA: Diagnosis present

## 2020-10-29 DIAGNOSIS — S0012XA Contusion of left eyelid and periocular area, initial encounter: Secondary | ICD-10-CM | POA: Diagnosis present

## 2020-10-29 DIAGNOSIS — N189 Chronic kidney disease, unspecified: Secondary | ICD-10-CM | POA: Insufficient documentation

## 2020-10-29 DIAGNOSIS — D638 Anemia in other chronic diseases classified elsewhere: Secondary | ICD-10-CM | POA: Diagnosis present

## 2020-10-29 DIAGNOSIS — M79641 Pain in right hand: Secondary | ICD-10-CM | POA: Diagnosis present

## 2020-10-29 DIAGNOSIS — E860 Dehydration: Secondary | ICD-10-CM | POA: Diagnosis present

## 2020-10-29 DIAGNOSIS — I10 Essential (primary) hypertension: Secondary | ICD-10-CM | POA: Diagnosis present

## 2020-10-29 DIAGNOSIS — R590 Localized enlarged lymph nodes: Secondary | ICD-10-CM | POA: Diagnosis present

## 2020-10-29 DIAGNOSIS — J9621 Acute and chronic respiratory failure with hypoxia: Secondary | ICD-10-CM | POA: Diagnosis present

## 2020-10-29 LAB — CBC
HCT: 30.4 % — ABNORMAL LOW (ref 36.0–46.0)
Hemoglobin: 9.3 g/dL — ABNORMAL LOW (ref 12.0–15.0)
MCH: 26.3 pg (ref 26.0–34.0)
MCHC: 30.6 g/dL (ref 30.0–36.0)
MCV: 86.1 fL (ref 80.0–100.0)
Platelets: 282 10*3/uL (ref 150–400)
RBC: 3.53 MIL/uL — ABNORMAL LOW (ref 3.87–5.11)
RDW: 14.9 % (ref 11.5–15.5)
WBC: 11.9 10*3/uL — ABNORMAL HIGH (ref 4.0–10.5)
nRBC: 0 % (ref 0.0–0.2)

## 2020-10-29 LAB — URINALYSIS, ROUTINE W REFLEX MICROSCOPIC
Bilirubin Urine: NEGATIVE
Glucose, UA: NEGATIVE mg/dL
Hgb urine dipstick: NEGATIVE
Ketones, ur: NEGATIVE mg/dL
Nitrite: POSITIVE — AB
Protein, ur: 30 mg/dL — AB
Specific Gravity, Urine: 1.011 (ref 1.005–1.030)
pH: 5 (ref 5.0–8.0)

## 2020-10-29 LAB — TROPONIN I (HIGH SENSITIVITY)
Troponin I (High Sensitivity): 12 ng/L (ref <18)
Troponin I (High Sensitivity): 23 ng/L — ABNORMAL HIGH (ref <18)

## 2020-10-29 LAB — HEMOGLOBIN A1C
Hgb A1c MFr Bld: 9.5 % — ABNORMAL HIGH (ref 4.8–5.6)
Mean Plasma Glucose: 225.95 mg/dL

## 2020-10-29 LAB — RESP PANEL BY RT-PCR (FLU A&B, COVID) ARPGX2
Influenza A by PCR: NEGATIVE
Influenza B by PCR: NEGATIVE
SARS Coronavirus 2 by RT PCR: POSITIVE — AB

## 2020-10-29 LAB — BASIC METABOLIC PANEL
Anion gap: 16 — ABNORMAL HIGH (ref 5–15)
BUN: 36 mg/dL — ABNORMAL HIGH (ref 8–23)
CO2: 31 mmol/L (ref 22–32)
Calcium: 8.6 mg/dL — ABNORMAL LOW (ref 8.9–10.3)
Chloride: 85 mmol/L — ABNORMAL LOW (ref 98–111)
Creatinine, Ser: 1.54 mg/dL — ABNORMAL HIGH (ref 0.44–1.00)
GFR, Estimated: 35 mL/min — ABNORMAL LOW (ref >60)
Glucose, Bld: 397 mg/dL — ABNORMAL HIGH (ref 70–99)
Potassium: 4 mmol/L (ref 3.5–5.1)
Sodium: 132 mmol/L — ABNORMAL LOW (ref 135–145)

## 2020-10-29 LAB — PROCALCITONIN: Procalcitonin: <0.1 ng/mL

## 2020-10-29 LAB — GLUCOSE, CAPILLARY: Glucose-Capillary: 307 mg/dL — ABNORMAL HIGH (ref 70–99)

## 2020-10-29 LAB — LACTIC ACID, PLASMA
Lactic Acid, Venous: 4.1 mmol/L (ref 0.5–1.9)
Lactic Acid, Venous: 1.9 mmol/L (ref 0.5–1.9)

## 2020-10-29 MED ORDER — FLUTICASONE-UMECLIDIN-VILANT 100-62.5-25 MCG/INH IN AEPB: 1.0000 | INHALATION_SPRAY | Freq: Every day | RESPIRATORY_TRACT | Status: DC

## 2020-10-29 MED ORDER — FLUTICASONE FUROATE-VILANTEROL 100-25 MCG/INH IN AEPB
1.0000 | INHALATION_SPRAY | Freq: Every day | RESPIRATORY_TRACT | Status: DC
  Administered 2020-10-30 – 2020-11-09 (×11): 1 via RESPIRATORY_TRACT
  Filled 2020-10-29: qty 28

## 2020-10-29 MED ORDER — BENZONATATE 100 MG PO CAPS
200.0000 mg | ORAL_CAPSULE | Freq: Two times a day (BID) | ORAL | Status: AC | PRN
  Administered 2020-10-31: 200 mg via ORAL
  Filled 2020-10-29: qty 2

## 2020-10-29 MED ORDER — MONTELUKAST SODIUM 10 MG PO TABS
10.0000 mg | ORAL_TABLET | Freq: Every day | ORAL | Status: DC
  Administered 2020-10-29 – 2020-11-08 (×11): 10 mg via ORAL
  Filled 2020-10-29 (×11): qty 1

## 2020-10-29 MED ORDER — ONDANSETRON HCL 4 MG/2ML IJ SOLN: 4.0000 mg | Freq: Four times a day (QID) | INTRAMUSCULAR | Status: DC | PRN

## 2020-10-29 MED ORDER — ALBUTEROL SULFATE HFA 108 (90 BASE) MCG/ACT IN AERS
2.0000 | INHALATION_SPRAY | RESPIRATORY_TRACT | Status: AC
  Administered 2020-10-29 – 2020-10-31 (×10): 2 via RESPIRATORY_TRACT
  Filled 2020-10-29: qty 6.7

## 2020-10-29 MED ORDER — UMECLIDINIUM BROMIDE 62.5 MCG/INH IN AEPB
1.0000 | INHALATION_SPRAY | Freq: Every day | RESPIRATORY_TRACT | Status: DC
  Administered 2020-10-30 – 2020-11-09 (×11): 1 via RESPIRATORY_TRACT
  Filled 2020-10-29 (×2): qty 7

## 2020-10-29 MED ORDER — SODIUM CHLORIDE 0.9 % IV SOLN
200.0000 mg | Freq: Once | INTRAVENOUS | Status: AC
  Administered 2020-10-29: 200 mg via INTRAVENOUS
  Filled 2020-10-29: qty 40

## 2020-10-29 MED ORDER — INSULIN GLARGINE-YFGN 100 UNIT/ML ~~LOC~~ SOLN
45.0000 [IU] | Freq: Every day | SUBCUTANEOUS | Status: DC
  Administered 2020-10-29 – 2020-11-02 (×5): 45 [IU] via SUBCUTANEOUS
  Filled 2020-10-29 (×5): qty 0.45

## 2020-10-29 MED ORDER — INSULIN ASPART 100 UNIT/ML IJ SOLN
0.0000 [IU] | Freq: Every day | INTRAMUSCULAR | Status: DC
  Administered 2020-10-29 – 2020-10-30 (×2): 4 [IU] via SUBCUTANEOUS
  Administered 2020-11-02: 2 [IU] via SUBCUTANEOUS
  Administered 2020-11-03: 22:00:00 4 [IU] via SUBCUTANEOUS
  Administered 2020-11-04 – 2020-11-08 (×3): 2 [IU] via SUBCUTANEOUS
  Filled 2020-10-29 (×7): qty 1

## 2020-10-29 MED ORDER — ACETAMINOPHEN 325 MG PO TABS
650.0000 mg | ORAL_TABLET | Freq: Four times a day (QID) | ORAL | Status: AC | PRN
  Administered 2020-10-29 – 2020-11-01 (×6): 650 mg via ORAL
  Filled 2020-10-29 (×5): qty 2

## 2020-10-29 MED ORDER — SODIUM CHLORIDE 0.9 % IV BOLUS
1000.0000 mL | Freq: Once | INTRAVENOUS | Status: AC
  Administered 2020-10-29: 1000 mL via INTRAVENOUS

## 2020-10-29 MED ORDER — INSULIN ASPART 100 UNIT/ML IJ SOLN
0.0000 [IU] | Freq: Three times a day (TID) | INTRAMUSCULAR | Status: DC
  Administered 2020-10-30 (×2): 5 [IU] via SUBCUTANEOUS
  Administered 2020-10-30: 14:00:00 7 [IU] via SUBCUTANEOUS
  Filled 2020-10-29 (×3): qty 1

## 2020-10-29 MED ORDER — ONDANSETRON HCL 4 MG PO TABS
4.0000 mg | ORAL_TABLET | Freq: Four times a day (QID) | ORAL | Status: DC | PRN
Start: 2020-10-29 — End: 2020-11-09

## 2020-10-29 MED ORDER — GUAIFENESIN-DM 100-10 MG/5ML PO SYRP
10.0000 mL | ORAL_SOLUTION | ORAL | Status: DC | PRN
  Administered 2020-10-31 – 2020-11-03 (×5): 10 mL via ORAL
  Filled 2020-10-29 (×8): qty 10

## 2020-10-29 MED ORDER — FUROSEMIDE 10 MG/ML IJ SOLN
40.0000 mg | Freq: Once | INTRAMUSCULAR | Status: AC
  Administered 2020-10-29: 40 mg via INTRAVENOUS
  Filled 2020-10-29: qty 4

## 2020-10-29 MED ORDER — LORAZEPAM 2 MG/ML IJ SOLN: 2.0000 mg | INTRAMUSCULAR | Status: DC | PRN

## 2020-10-29 MED ORDER — DULAGLUTIDE 3 MG/0.5ML ~~LOC~~ SOAJ: 3.0000 mg | SUBCUTANEOUS | Status: DC

## 2020-10-29 MED ORDER — ACETAMINOPHEN 325 MG PO TABS
325.0000 mg | ORAL_TABLET | Freq: Four times a day (QID) | ORAL | Status: DC | PRN
  Filled 2020-10-29: qty 1

## 2020-10-29 MED ORDER — ATORVASTATIN CALCIUM 20 MG PO TABS
10.0000 mg | ORAL_TABLET | Freq: Every day | ORAL | Status: DC
  Administered 2020-10-29 – 2020-11-08 (×11): 10 mg via ORAL
  Filled 2020-10-29 (×11): qty 1

## 2020-10-29 MED ORDER — PANTOPRAZOLE SODIUM 40 MG PO TBEC
80.0000 mg | DELAYED_RELEASE_TABLET | Freq: Every day | ORAL | Status: DC
  Administered 2020-10-30 – 2020-11-09 (×11): 80 mg via ORAL
  Filled 2020-10-29 (×11): qty 2

## 2020-10-29 MED ORDER — APIXABAN 5 MG PO TABS
5.0000 mg | ORAL_TABLET | Freq: Two times a day (BID) | ORAL | Status: DC
  Administered 2020-10-29 – 2020-11-09 (×22): 5 mg via ORAL
  Filled 2020-10-29 (×2): qty 1
  Filled 2020-10-29 (×2): qty 2
  Filled 2020-10-29: qty 1
  Filled 2020-10-29 (×2): qty 2
  Filled 2020-10-29: qty 1
  Filled 2020-10-29 (×3): qty 2
  Filled 2020-10-29: qty 1
  Filled 2020-10-29 (×9): qty 2
  Filled 2020-10-29: qty 1

## 2020-10-29 MED ORDER — HYDRALAZINE HCL 10 MG PO TABS
10.0000 mg | ORAL_TABLET | Freq: Four times a day (QID) | ORAL | Status: AC | PRN
  Filled 2020-10-29 (×2): qty 1

## 2020-10-29 MED ORDER — REMDESIVIR 100 MG IV SOLR
100.0000 mg | Freq: Every day | INTRAVENOUS | Status: AC
  Administered 2020-10-30 – 2020-11-02 (×4): 100 mg via INTRAVENOUS
  Filled 2020-10-29: qty 100
  Filled 2020-10-29: qty 20
  Filled 2020-10-29 (×2): qty 100
  Filled 2020-10-29: qty 20

## 2020-10-29 NOTE — ED Notes (Signed)
Patient ambulated to restroom without assistance, did not tell RN and removed oxygen. Patient pulled bathroom assistance light, RN responded to restroom, patient reports she had episode of diarrhea and has shortness of breath. Patient assisted back to chair and oxygen placed back on patient.

## 2020-10-29 NOTE — ED Notes (Signed)
Request made for transport to the floor ?

## 2020-10-29 NOTE — ED Provider Notes (Signed)
-----------------------------------------   4:27 PM on 10/29/2020 ----------------------------------------- Patient's labs have resulted showing an elevated lactic acidosis of 4.1, patient has tested positive for COVID, likely explaining her syncopal episode and likely dehydration as well.  Chest CT shows a pleural effusion as well as pneumonia which most likely is viral pneumonia given her COVID-positive status.  Given the patient's generalized weakness syncope episode elevated lactic acid we will admit to the hospital service for further work-up and treatment.  Patient receiving IV fluids for elevated lactic acid.  I spoke to the patient regarding her COVID diagnosis which was unknown to the patient and plan for admission.  Patient agreeable to plan of care.   Harvest Dark, MD 10/29/20 [phone]

## 2020-10-29 NOTE — ED Provider Notes (Signed)
[facility] Emergency Department Provider Note  ____________________________________________   Event Date/Time   First MD Initiated Contact with Patient 10/29/20 1404     (approximate)  I have reviewed the triage vital signs and the nursing notes.   HISTORY  Chief Complaint Loss of Consciousness and Shortness of Breath    HPI Karen [name] is a 75 y.o. female with COPD on 3 L of oxygen at baseline, heart failure, anemia, diabetes, history of blood clots on Eliquis who comes in with concerns for loss of consciousness.  Patient states that she was sitting in bed and her husband saw her have a syncopal episode.  She states that she felt her normal self this morning and feels her normal self now.  Patient has labored breathing and initially presented with shortness of breath and cough in triage but she is declining this at this time stating that is her baseline self.  According to patient when I asked her if there was any seizure-like activity she stated that her husband thought she had had a seizure?.  However she did denies urinating on herself or tongue biting or history of seizures.    Past Medical History:  Diagnosis Date   (HFpEF) heart failure with preserved ejection fraction ([place])    a. 2017 Echo: EF 50%; b. 06/2018 Echo: EF 50-55%; c. 08/2018 Echo: EF 50-55%, Nl RV fxn; d. 09/2019 Echo: EF 55-60%, no rwma, mild LVH, Gr1 DD, nl RV size/fxn, PASP 63.11mHg. Mildly dil LA. Triv MR. Mod AS (AoV 0.94cm^2 VTI; mean grad 17.320mg).   Acute on chronic respiratory failure with hypoxia and hypercapnia (HCC) 01/07/2015   Anemia    Asterixis 01/07/2015   Asthma    Cataract    CKD (chronic kidney disease), stage III (HCC)    COPD (chronic obstructive pulmonary disease) ([place]   a. 06/2018 tobacco use, home 3L oxygen    Diabetes mellitus without complication (HCC[place]   a. 09/2019 A1C 8.8   Edema, peripheral 04/20/2014   GI bleed 06/28/2019   History of kidney stones     Hyperlipidemia    Hypertension    Iron deficiency anemia 06/22/2014   Junctional bradycardia    a. In setting of beta blocker therapy.   Leucocytosis 10/19/2015   Moderate aortic stenosis    a.  09/2019 Echo: Mod AS (AoV 0.94cm^2 VTI; mean grad 17.65m51m).   Morbid obesity (HCC[place]  Overactive bladder    Primary osteoarthritis of right knee 09/01/2016   Sciatica 01/07/2015    Patient Active Problem List   Diagnosis Date Noted   Aortic stenosis, moderate    AF (paroxysmal atrial fibrillation) (HCC)    Gastroesophageal reflux disease without esophagitis    Acute CHF (congestive heart failure) (HCCMil[place]4/14/2022   Chronic respiratory failure with hypercapnia (HCCPur[place]3/18/2022   Severe sepsis (HCCBert[place]3/18/2022   UTI (urinary tract infection) 05/21/2020   Hyperglycemia due to type 2 diabetes mellitus (HCCKing[place]3/18/2022   Abnormal CT of liver 05/21/2020   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 05/21/2020   Morbid obesity (HCCGurabo3/07/2020   Acute hip pain, left 10/23/2019   Lumbar stenosis with neurogenic claudication 10/23/2019   COPD with acute exacerbation (HCCAdairsville7/123456Acute diastolic CHF (congestive heart failure) (HCCGary7/25/2021   (HFpEF) heart failure with preserved ejection fraction (HCCDripping Springs7/24/2021   Rectal bleeding 06/28/2019   Hypokalemia 06/28/2019   Hyponatremia 06/28/2019   Type 2 diabetes mellitus with hyperlipidemia (HCCLake Annet[place]  06/28/2019   CKD (chronic kidney disease), stage IIIa 06/28/2019   Atrial fibrillation, chronic ([place]) 06/28/2019   Pulmonary edema 02/27/2019   Bradycardia 02/24/2019   Chronic respiratory failure with hypoxia ([place]) 02/23/2019   Atypical chest pain 02/13/2019   Osteopenia of neck of left femur 11/20/2018   AVM (arteriovenous malformation) of small bowel, acquired    Acute gastric ulcer with hemorrhage    Chronic diastolic heart failure ([place]) 08/22/2018   Diarrhea 08/22/2018   Junctional bradycardia    Acute on chronic heart failure with  preserved ejection fraction (HFpEF) ([place])    AKI (acute kidney injury) ([place])    Symptomatic bradycardia 08/05/2018   Acute on chronic respiratory failure ([place]) 06/25/2018   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 diabetes mellitus ([place]) 01/25/2018   History of non anemic vitamin B12 deficiency 01/25/2018   Personal history of kidney stones 11/12/2017   Urge incontinence 11/12/2017   History of leukocytosis 09/17/2017   Right ureteral stone 09/15/2017   Acute GI bleeding    GI bleed 08/26/2017   Arthritis 08/10/2017   Stage 4 chronic kidney disease ([place]) 08/10/2017   COPD (chronic obstructive pulmonary disease) ([place]) 08/10/2017   Diabetes mellitus type 2, uncomplicated ([place]) 0000000   Hypertension 08/10/2017   Obesity (BMI 35.0-39.9 without comorbidity) 04/11/2017   Primary osteoarthritis of right knee 09/01/2016   Leucocytosis 10/19/2015   Asterixis 01/07/2015   Acute on chronic respiratory failure with hypoxia and hypercapnia (HCC) 01/07/2015   Sciatica 01/07/2015   Weakness 01/07/2015   Chronic 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 01/04/2015   Iron deficiency anemia 06/22/2014   Microalbuminuria 06/22/2014   CHF (congestive heart failure) ([place]) 04/20/2014   Edema, peripheral 04/20/2014    Past Surgical History:  Procedure Laterality Date   APPENDECTOMY     CESAREAN SECTION     x3   CHOLECYSTECTOMY     COLONOSCOPY WITH PROPOFOL N/A 08/28/2017   Procedure: COLONOSCOPY WITH PROPOFOL;  Surgeon: Lucilla Lame, MD;  [facility] ENDOSCOPY;  Service: Endoscopy;  Laterality: N/A;   COLONOSCOPY WITH PROPOFOL N/A 08/29/2017   Procedure: COLONOSCOPY WITH PROPOFOL;  Surgeon: Lucilla Lame, MD;  Location: [facility] ENDOSCOPY;  Service: Endoscopy;  Laterality: N/A;   CYSTOSCOPY W/ URETERAL STENT PLACEMENT Right 09/15/2017   Procedure: CYSTOSCOPY WITH RETROGRADE PYELOGRAM/URETERAL STENT PLACEMENT;  Surgeon: Cleon Gustin, MD;  Location: [facility] ORS;  Service:  Urology;  Laterality: Right;   CYSTOSCOPY/URETEROSCOPY/HOLMIUM LASER/STENT PLACEMENT Right 10/09/2017   Procedure: CYSTOSCOPY/URETEROSCOPY/HOLMIUM LASER/STENT PLACEMENT;  Surgeon: Abbie Sons, MD;  Location: [facility] ORS;  Service: Urology;  Laterality: Right;  right Stent exchange   ESOPHAGOGASTRODUODENOSCOPY (EGD) WITH PROPOFOL N/A 09/16/2018   Procedure: ESOPHAGOGASTRODUODENOSCOPY (EGD) WITH PROPOFOL;  Surgeon: Lin Landsman, MD;  Location: [facility];  Service: Gastroenterology;  Laterality: N/A;   EYE SURGERY      Prior to Admission medications   Medication Sig Start Date End Date Taking? Authorizing Provider  acetaminophen (TYLENOL) 500 MG tablet Take 1-2 tablets (500-1,000 mg total) by mouth every 6 (six) hours as needed for mild pain or fever. Do not take more than 4 grams a day 05/25/20   Debbe Odea, MD  albuterol (VENTOLIN HFA) 108 (90 Base) MCG/ACT inhaler Inhale 2 puffs into the lungs every 6 (six) hours as needed for wheezing or shortness of breath.     [provider]  apixaban (ELIQUIS) 5 MG TABS tablet Take 1 tablet by mouth twice daily 10/25/20   [name], MD  atorvastatin (  LIPITOR) 10 MG tablet Take 10 mg by mouth daily.     [provider]  benzonatate (TESSALON) 100 MG capsule Take 2 capsules (200 mg total) by mouth every 8 (eight) hours. 07/14/20   Margarette Canada, NP  esomeprazole (NEXIUM) 40 MG capsule Take 40 mg by mouth daily. 12/17/18   [provider]  Ferrous Sulfate (IRON) 325 (65 Fe) MG TABS Take 1 tablet by mouth daily. 09/14/17   [provider]  fluticasone (FLONASE) 50 MCG/ACT nasal spray Place 2 sprays into both nostrils daily. 11/15/18   [provider]  Fluticasone-Umeclidin-Vilant 100-62.5-25 MCG/INH AEPB Inhale 1 puff into the lungs daily. 12/25/17   [provider]  furosemide (LASIX) 40 MG tablet Take 1 tablet (40 mg total) by mouth daily. 06/21/20 06/21/21  Val Riles, MD  insulin glargine  (LANTUS SOLOSTAR) 100 UNIT/ML Solostar Pen Inject 45 Units into the skin at bedtime. 05/25/20   Debbe Odea, MD  insulin lispro (HUMALOG KWIKPEN) 100 UNIT/ML KwikPen Inject 10 Units into the skin 3 (three) times daily. 05/25/20   Debbe Odea, MD  ipratropium (ATROVENT) 0.06 % nasal spray Place 2 sprays into both nostrils 4 (four) times daily. 05/28/20   Margarette Canada, NP  losartan (COZAAR) 50 MG tablet Take 1 tablet (50 mg total) by mouth daily. 06/22/20 10/06/20  Val Riles, MD  metFORMIN (GLUCOPHAGE) 500 MG tablet Take 1,000 mg by mouth daily.    [provider]  montelukast (SINGULAIR) 10 MG tablet Take 10 mg by mouth at bedtime.    [provider]  OZEMPIC, 0.25 OR 0.5 MG/DOSE, 2 MG/1.5ML SOPN SMARTSIG:0.375 Milliliter(s) SUB-Q Once a Week 05/12/20   [provider]  promethazine-dextromethorphan (PROMETHAZINE-DM) 6.25-15 MG/5ML syrup Take 5 mLs by mouth 4 (four) times daily as needed. 07/14/20   Margarette Canada, NP  vitamin B-12 (CYANOCOBALAMIN) 500 MCG tablet Take 500 mcg by mouth daily.    [provider]    Allergies Ace inhibitors, Beta adrenergic blockers, Gabapentin, Lisinopril, Lyrica [pregabalin], and Shrimp [shellfish allergy]  Family History  Problem Relation Age of Onset   Other Mother        unknown medical history   Other Father        unknown medical history    Social History Social History   Tobacco Use   Smoking status: Former    Packs/day: 1.00    Years: 20.00    Pack years: 20.00    Types: Cigarettes    Quit date: 12/04/1992    Years since quitting: 27.9   Smokeless tobacco: Never  Vaping Use   Vaping Use: Never used  Substance Use Topics   Alcohol use: No   Drug use: No      Review of Systems Constitutional: No fever/chills positive seizure versus syncope Eyes: No visual changes. ENT: No sore throat. Cardiovascular: No chest pain Respiratory: Positive for SOB and cough in triage but denying now Gastrointestinal: No  abdominal pain.  No nausea, no vomiting.  No diarrhea.  No constipation. Genitourinary: Negative for dysuria. Musculoskeletal: Negative for back pain. Skin: Negative for rash. Neurological: Negative for headaches, focal weakness or numbness. All other ROS negative ____________________________________________   PHYSICAL EXAM:  VITAL SIGNS: ED Triage Vitals  Enc Vitals Group     BP 10/29/20 1238 (!) 110/52     Pulse Rate 10/29/20 1238 (!) 102     Resp 10/29/20 1238 (!) 24     Temp 10/29/20 1238 98.6 F (37 C)  Temp Source 10/29/20 1238 Oral     SpO2 10/29/20 1238 93 %     Weight 10/29/20 1234 224 lb 13.9 oz (102 kg)     Height 10/29/20 1234 '5\' 3"'$  (1.6 m)     Head Circumference --      Peak Flow --      Pain Score 10/29/20 1234 9     Pain Loc --      Pain Edu? --      Excl. in [place]? --     Constitutional: Alert and oriented. Well appearing and in no acute distress. Eyes: Conjunctivae are normal. EOMI. Head: Atraumatic. Nose: No congestion/rhinnorhea. Mouth/Throat: Mucous membranes are moist.   Neck: No stridor. Trachea Midline. FROM Cardiovascular: Normal rate, regular rhythm. Grossly normal heart sounds.  Good peripheral circulation. Respiratory: Clear lungs on baseline 3 L with some mild increased work of breathing Gastrointestinal: Soft and nontender. No distention. No abdominal bruits.  Musculoskeletal: No lower extremity tenderness nor edema.  No joint effusions. Neurologic:  Normal speech and language. No gross focal neurologic deficits are appreciated.  Skin:  Skin is warm, dry and intact. No rash noted. Psychiatric: Mood and affect are normal. Speech and behavior are normal. GU: Deferred   ____________________________________________   LABS (all labs ordered are listed, but only abnormal results are displayed)  Labs Reviewed  BASIC METABOLIC PANEL - Abnormal; Notable for the following components:      Result Value   Sodium 132 (*)    Chloride 85 (*)     Glucose, Bld 397 (*)    BUN 36 (*)    Creatinine, Ser 1.54 (*)    Calcium 8.6 (*)    GFR, Estimated 35 (*)    Anion gap 16 (*)    All other components within normal limits  CBC - Abnormal; Notable for the following components:   WBC 11.9 (*)    RBC 3.53 (*)    Hemoglobin 9.3 (*)    HCT 30.4 (*)    All other components within normal limits  RESP PANEL BY RT-PCR (FLU A&B, COVID) ARPGX2  CULTURE, BLOOD (ROUTINE X 2)  CULTURE, BLOOD (ROUTINE X 2)  LACTIC ACID, PLASMA  LACTIC ACID, PLASMA  PROCALCITONIN  TROPONIN I (HIGH SENSITIVITY)   ____________________________________________   ED ECG REPORT I, Vanessa Pala, the attending physician, personally viewed and interpreted this ECG.  Sinus rate of 100, no ST elevation, no T wave inversions, normal intervals.  A little bit of artifact ____________________________________________  RADIOLOGY Robert Bellow, personally viewed and evaluated these images (plain radiographs) as part of my medical decision making, as well as reviewing the written report by the radiologist.  ED MD interpretation: Possible right basilar opacity  Official radiology report(s): DG Chest 2 View  Result Date: 10/29/2020 CLINICAL DATA:  Shortness of breath. EXAM: CHEST - 2 VIEW COMPARISON:  Multiple chest radiographs, most recently 07/14/2020. CT chest, 06/23/2016. FINDINGS: Enlargement of the cardiac silhouette. Aortic vascular calcifications. Hypoinflation. The LEFT lung is relatively clear with trace basilar streaky opacity. Layering, small volume RIGHT pleural effusion, with adjacent basilar consolidation. No pneumothorax. No acute osseous abnormality. IMPRESSION: 1. Cardiomegaly with small volume RIGHT pleural effusion. 2. RIGHT basilar opacity likely to represent atelectasis, though early pneumonia could appear similar. 3.  Aortic Atherosclerosis (ICD10-I70.0). Electronically Signed   By: Michaelle Birks M.D.   On: 10/29/2020 13:47     ____________________________________________   PROCEDURES  Procedure(s) performed (including Critical Care):  Procedures   ____________________________________________  INITIAL IMPRESSION / ASSESSMENT AND PLAN / ED COURSE   Karen [name] was evaluated in Emergency Department on 10/29/2020 for the symptoms described in the history of present illness. She was evaluated in the context of the global COVID-19 pandemic, which necessitated consideration that the patient might be at risk for infection with the SARS-CoV-2 virus that causes COVID-19. Institutional protocols and algorithms that pertain to the evaluation of patients at risk for COVID-19 are in a state of rapid change based on information released by regulatory bodies including the CDC and federal and state organizations. These policies and algorithms were followed during the patient's care in the ED.     Patient comes in with syncopal versus seizure activity.  Suspect this more likely syncopal in nature but will get CT head to make sure no evidence of mass.  Will get labs to evaluate for Electra abnormalities, AKI, cardiac markers and keep patient on cardiac monitor to evaluate for arrhythmia.  Chest x-ray is concerning for potential pneumonia so we will get CT chest to further evaluate.  Holding off on contrast due to patient's AKI patient's on Eliquis therefore low suspicion for PE.  She denies any missed doses.  She is actually denying shortness of breath and cough to me although she stated she had it in triage.  Patient will be handed off to oncoming team pending imaging but suspect admission for syncopal work-up   ____________________________________________   FINAL CLINICAL IMPRESSION(S) / ED DIAGNOSES   Final diagnoses:  Syncope and collapse     MEDICATIONS GIVEN DURING THIS VISIT:  Medications - No data to display   ED Discharge Orders     None        Note:  This document was prepared using Dragon  voice recognition software and may include unintentional dictation errors.   Vanessa , MD 10/29/20 1536

## 2020-10-29 NOTE — ED Notes (Signed)
Patient transported to CT 

## 2020-10-29 NOTE — H&P (Signed)
History and Physical   Karen [name] LYH:[id number] DOB: Jun 14, 1945 DOA: 10/29/2020  PCP: Earlie Counts, FNP  Outpatient Specialists: Dr. Leim Fabry, orthopedic Patient coming from: Home via private vehicle  I have personally briefly reviewed patient's old medical records in [place].  Chief Concern: Syncope  HPI: Karen [name] is a 75 y.o. female with medical history significant for COPD, obesity, history of tobacco use, CAD, insulin-dependent diabetes mellitus, GERD, hyperlipidemia, hypertension, presents the emergency department for chief concerns of syncope.  At bedside, she was able to tell me her name, age, current calendar, location of hospital.   She reports she passed out. She does not know for how long. She reports she was taking her duoneb treatment when this occurred.  She denies ever passing out like this before. Her husband was present and he called his son to take patient to the hospital.   She reports that her husband states that when she passed out she was shaking all over.  She denies urinary and bowel incontinence.  She reports she had muscle aches that started about two days ago and has now resolved.  She endorses compliance with her eliquis and denies missing any dose.   She denies new cough, chills, fever, chest pain, new shortness of breath, abdominal pain, dysuria, hematuria, unintentional weight changes. She denies dysphagia, changes to her vision.   Social history: She lives at home with her husband and son. She was a former tobacco user, at her peak she was smoking 2 ppd. She quit about 20 years ago. She denies etoh and recreational drug use. She formerly worked at a [employer]. She is retired.   Vaccination history: She is vaccinated for covid 19, 3 doses of Moderna.   ROS: Constitutional: no weight change, no fever ENT/Mouth: no sore throat, no rhinorrhea Eyes: no eye pain, no vision changes Cardiovascular: no chest pain, no dyspnea,  no  edema, no palpitations Respiratory: no cough, no sputum, no wheezing Gastrointestinal: no nausea, no vomiting, no diarrhea, no constipation Genitourinary: no urinary incontinence, no dysuria, no hematuria Musculoskeletal: no arthralgias, + myalgias Skin: no skin lesions, no pruritus, Neuro: + weakness, + loss of consciousness, + syncope Psych: no anxiety, no depression, no decrease appetite Heme/Lymph: no bruising, no bleeding  ED Course: Discussed with emergency medicine provider, patient requiring hospitalization for syncope.  Vitals in the emergency department was remarkable for temperature 98.6, respiration rate of 24, heart rate of 102, blood pressure 110/52 and improved to 143/74, SPO2 of 93% on 3 L nasal cannula.  Labs in the emergency department was remarkable for COVID-positive.  Sodium 132, potassium 4.0, chloride 85, bicarb 31, BUN of 36, serum creatinine of 1.54, nonfasting blood glucose 397, GFR 35, WBC 11.9, hemoglobin 9.3, platelets 282.  High sensitive troponin was 12 and increased to 23.  Lactic acid initially is 4.1.  Procalcitonin was less than 0.1  ED provider gave patient 1 dose of sodium chloride 1 L bolus.  Assessment/Plan  Principal Problem:   Pneumonia due to COVID-19 virus Active Problems:   Weakness   Leucocytosis   COPD (chronic obstructive pulmonary disease) (HCC)   Hypertension   AKI (acute kidney injury) ([place])   Type 2 diabetes mellitus with hyperlipidemia (HCC)   CKD (chronic kidney disease), stage IIIa   (HFpEF) heart failure with preserved ejection fraction (HCC)   Morbid obesity (HCC)   AF (paroxysmal atrial fibrillation) (HCC)   Pulmonary nodules/lesions, multiple   # Syncope-etiology work-up in progress  however this may be multifactorial in setting of COVID-19 infection, right-sided pleural effusion Acute hypoxemic respiratory failure secondary to COVID-19 infection - Per CT chest without contrast read as small to moderate size right  pleural effusion with near complete collapse of the right lower lobe.  Right lower lobe collapse could be related to compressive atelectasis but cannot exclude underlying pneumonia. - IV remdesivir per pharmacy, IV solumedrol 1 g/kg IV q12h initiated  - No suspected superimposed bacterial infection suspected as procalcitonin was negative - Incentive spirometry and flutter valve for 10 reps every 2 hours while awake - Albuterol inhaler 2 puffs every 4 hours while awake - Conservative fluid management at this time - Daily labs: CMP, CBC, CRP, D-dimer - Supplemental oxygen to maintain SPO2 goal of greater than 88% - Airborne and contact precautions  # Acute kidney injury on CKD 3a to no CKD - Serum creatinine on presentation was 1.54, EGFR 35, baseline serum creatinine is in the range of 0.86-1.25, EGFR 45 to greater than 60 - I suspect this is prerenal in setting of pleural effusion - Avoid nephrotoxic agents - No IVF at this time - Lasix 40 mg IV once - Holding home hydrochlorothiazide and losartan at this time  # Right pleural effusion with near complete collapse of the right lower lobe - Patient is on baseline 3 L [place] oxygen supplementation at this time - Furosemide 40 mg IV once - A.m. team to consider serial chest x-ray versus consultation to IR for thoracentesis  # History of hypertension-hydralazine 10 mg p.o. every 6 hours as needed for SBP greater than 160, 2 days ordered - Patient takes hydrochlorothiazide 25 mg daily, furosemide 40 mg daily, losartan 50 mg daily  # Hyperlipidemia-atorvastatin 10 mg nightly  # Insulin-dependent diabetes mellitus - Resumed home long-acting 45 units nightly, dulaglutide 3 mg subcutaneous every Wednesday - Did not resume home sliding scale - Insulin SSI with at bedtime coverage ordered - Heart healthy/carb modified diet  # Paroxysmal atrial fibrillation-resumed home apixaban 5 mg p.o. twice daily  # Several small pulmonary nodules - Largest  being 5 mm - Patient needs outpatient follow-up and CT of the chest in 12 months  # Mediastinal lymph nodes-suspect reactive given acute COVID infection at this time - Outpatient follow-up with PCP  # GERD-PPI  # COPD-resumed home inhalers - I did not resume duo nebulizers due to patient having COVID-19 infection  Chart reviewed.   DVT prophylaxis: Apixaban 5 mg twice daily Code Status: full code  Diet: Heart healthy/carb modified Family Communication: Attempted to call spouse, Mr. Ramesha Poster at [phone], no pickup Disposition Plan: Pending clinical course Consults called: None at this time Admission status: MedSurg, observation, telemetry ordered for 24 hours  Past Medical History:  Diagnosis Date   (HFpEF) heart failure with preserved ejection fraction ([place])    a. 2017 Echo: EF 50%; b. 06/2018 Echo: EF 50-55%; c. 08/2018 Echo: EF 50-55%, Nl RV fxn; d. 09/2019 Echo: EF 55-60%, no rwma, mild LVH, Gr1 DD, nl RV size/fxn, PASP 63.29mHg. Mildly dil LA. Triv MR. Mod AS (AoV 0.94cm^2 VTI; mean grad 17.347mg).   Acute on chronic respiratory failure with hypoxia and hypercapnia (HCC) 01/07/2015   Anemia    Asterixis 01/07/2015   Asthma    Catar[place]    CKD (chronic kidney disease), stage III (HCC)    COPD [place] disease) ([place]   a. 06/2018 tobacco use, home 3L oxygen    Diabetes mellitus without complication ([place]  a. 09/2019 A1C 8.8   Edema, peripheral 04/20/2014   GI bleed 06/28/2019   History of kidney stones    Hyperlipidemia    Hypertension    Iron deficiency anemia 06/22/2014   Junctional bradycardia    a. In setting of beta blocker therapy.   Leucocytosis 10/19/2015   Moderate aortic stenosis    a.  09/2019 Echo: Mod AS (AoV 0.94cm^2 VTI; mean grad 17.44mHg).   Morbid obesity ([place]    Overactive bladder    Primary osteoarthritis of right knee 09/01/2016   Sciatica 01/07/2015   Past Surgical History:  Procedure Laterality Date   APPENDECTOMY      CESAREAN SECTION     x3   CHOLECYSTECTOMY     COLONOSCOPY WITH PROPOFOL N/A 08/28/2017   Procedure: COLONOSCOPY WITH PROPOFOL;  Surgeon: WLucilla Lame MD;  Location: [facility] ENDOSCOPY;  Service: Endoscopy;  Laterality: N/A;   COLONOSCOPY WITH PROPOFOL N/A 08/29/2017   Procedure: COLONOSCOPY WITH PROPOFOL;  Surgeon: WLucilla Lame MD;  [facility];  Service: Endoscopy;  Laterality: N/A;   CYSTOSCOPY W/ URETERAL STENT PLACEMENT Right 09/15/2017   Procedure: CYSTOSCOPY WITH RETROGRADE PYELOGRAM/URETERAL STENT PLACEMENT;  Surgeon: MCleon Gustin MD;  Location: [facility] ORS;  Service: Urology;  Laterality: Right;   CYSTOSCOPY/URETEROSCOPY/HOLMIUM LASER/STENT PLACEMENT Right 10/09/2017   Procedure: CYSTOSCOPY/URETEROSCOPY/HOLMIUM LASER/STENT PLACEMENT;  Surgeon: SAbbie Sons MD;  Location: [facility] ORS;  Service: Urology;  Laterality: Right;  right Stent exchange   ESOPHAGOGASTRODUODENOSCOPY (EGD) WITH PROPOFOL N/A 09/16/2018   Procedure: ESOPHAGOGASTRODUODENOSCOPY (EGD) WITH PROPOFOL;  Surgeon: VLin Landsman MD;  [place]  Service: Gastroenterology;  Laterality: N/A;   EYE SURGERY     Social History:  reports that she quit smoking about 27 years ago. Her smoking use included cigarettes. She has a 20.00 pack-year smoking history. She has never used smokeless tobacco. She reports that she does not drink alcohol and does not use drugs.  Allergies  Allergen Reactions   Ace Inhibitors Hives   Beta Adrenergic Blockers     Junctional bradycardia   Gabapentin Hives   Lisinopril Hives   Lyrica [Pregabalin] Hives   Shrimp [Shellfish Allergy] Swelling    Swelling of the lips   Family History  Problem Relation Age of Onset   Other Mother        unknown medical history   Other Father        unknown medical history   Family history: Family history reviewed and not pertinent  Prior to Admission medications   Medication Sig Start Date End Date Taking? Authorizing  Provider  acetaminophen (TYLENOL) 500 MG tablet Take 1-2 tablets (500-1,000 mg total) by mouth every 6 (six) hours as needed for mild pain or fever. Do not take more than 4 grams a day 05/25/20   RDebbe Odea MD  albuterol (VENTOLIN HFA) 108 (90 Base) MCG/ACT inhaler Inhale 2 puffs into the lungs every 6 (six) hours as needed for wheezing or shortness of breath.     [provider]  apixaban (ELIQUIS) 5 MG TABS tablet Take 1 tablet by mouth twice daily 10/25/20   GMinna Merritts MD  atorvastatin (LIPITOR) 10 MG tablet Take 10 mg by mouth daily.     [provider]  benzonatate (TESSALON) 100 MG capsule Take 2 capsules (200 mg total) by mouth every 8 (eight) hours. 07/14/20   RMargarette Canada NP  esomeprazole (NEXIUM) 40 MG capsule Take 40 mg by mouth daily. 12/17/18   [provider]  Ferrous  Sulfate (IRON) 325 (65 Fe) MG TABS Take 1 tablet by mouth daily. 09/14/17   [provider]  fluticasone (FLONASE) 50 MCG/ACT nasal spray Place 2 sprays into both nostrils daily. 11/15/18   [provider]  Fluticasone-Umeclidin-Vilant 100-62.5-25 MCG/INH AEPB Inhale 1 puff into the lungs daily. 12/25/17   [provider]  furosemide (LASIX) 40 MG tablet Take 1 tablet (40 mg total) by mouth daily. 06/21/20 06/21/21  Val Riles, MD  insulin glargine (LANTUS SOLOSTAR) 100 UNIT/ML Solostar Pen Inject 45 Units into the skin at bedtime. 05/25/20   Debbe Odea, MD  insulin lispro (HUMALOG KWIKPEN) 100 UNIT/ML KwikPen Inject 10 Units into the skin 3 (three) times daily. 05/25/20   Debbe Odea, MD  ipratropium (ATROVENT) 0.06 % nasal spray Place 2 sprays into both nostrils 4 (four) times daily. 05/28/20   Margarette Canada, NP  losartan (COZAAR) 50 MG tablet Take 1 tablet (50 mg total) by mouth daily. 06/22/20 10/06/20  Val Riles, MD  metFORMIN (GLUCOPHAGE) 500 MG tablet Take 1,000 mg by mouth daily.    [provider]  montelukast (SINGULAIR) 10 MG tablet Take  10 mg by mouth at bedtime.    [provider]  OZEMPIC, 0.25 OR 0.5 MG/DOSE, 2 MG/1.5ML SOPN SMARTSIG:0.375 Milliliter(s) SUB-Q Once a Week 05/12/20   [provider]  promethazine-dextromethorphan (PROMETHAZINE-DM) 6.25-15 MG/5ML syrup Take 5 mLs by mouth 4 (four) times daily as needed. 07/14/20   Margarette Canada, NP  vitamin B-12 (CYANOCOBALAMIN) 500 MCG tablet Take 500 mcg by mouth daily.    [provider]   Physical Exam: Vitals:   10/29/20 1421 10/29/20 1600 10/29/20 1730 10/29/20 1939  BP: 129/80 122/81 132/64 (!) 118/54  Pulse: 91 91 90 87  Resp: '20 18 18 19  ' Temp:      TempSrc:      SpO2: 95% 99% 97% 97%  Weight:      Height:       Constitutional: appears age-appropriate, NAD, calm, comfortable Eyes: PERRL, lids and conjunctivae normal ENMT: Mucous membranes are moist. Posterior pharynx clear of any exudate or lesions. Age-appropriate dentition. Hearing appropriate Neck: normal, supple, no masses, no thyromegaly Respiratory: clear to auscultation bilaterally, no wheezing, no crackles. Normal respiratory effort. No accessory muscle use.  Cardiovascular: Regular rate and rhythm, no murmurs / rubs / gallops. No extremity edema. 2+ pedal pulses. No carotid bruits.  Abdomen: Obese abdomen, no tenderness, no masses palpated, no hepatosplenomegaly. Bowel sounds positive.  Musculoskeletal: no clubbing / cyanosis. No joint deformity upper and lower extremities. Good ROM, no contractures, no atrophy. Normal muscle tone.  Skin: no rashes, lesions, ulcers. No induration Neurologic: Sensation intact. Strength 5/5 in all 4.  Psychiatric: Normal judgment and insight. Alert and oriented x 3. Normal mood.   EKG: independently reviewed, showing sinus tachycardia with rate of 100, QTc 482  Chest x-ray on Admission: I personally reviewed and I agree with radiologist reading as below.  DG Chest 2 View  Result Date: 10/29/2020 CLINICAL DATA:  Shortness of breath. EXAM:  CHEST - 2 VIEW COMPARISON:  Multiple chest radiographs, most recently 07/14/2020. CT chest, 06/23/2016. FINDINGS: Enlargement of the cardiac silhouette. Aortic vascular calcifications. Hypoinflation. The LEFT lung is relatively clear with trace basilar streaky opacity. Layering, small volume RIGHT pleural effusion, with adjacent basilar consolidation. No pneumothorax. No acute osseous abnormality. IMPRESSION: 1. Cardiomegaly with small volume RIGHT pleural effusion. 2. RIGHT basilar opacity likely to represent atelectasis, though early pneumonia could appear similar. 3.  Aortic Atherosclerosis (  ICD10-I70.0). Electronically Signed   By: Michaelle Birks M.D.   On: 10/29/2020 13:47   CT HEAD WO CONTRAST (5MM)  Result Date: 10/29/2020 CLINICAL DATA:  Loss of consciousness, syncope EXAM: CT HEAD WITHOUT CONTRAST TECHNIQUE: Contiguous axial images were obtained from the base of the skull through the vertex without intravenous contrast. COMPARISON:  01/07/2015 FINDINGS: Brain: No acute infarct or hemorrhage. Lateral ventricles and midline structures are unremarkable. No acute extra-axial fluid collections. No mass effect. Vascular: No hyperdense vessel or unexpected calcification. Skull: Normal. Negative for fracture or focal lesion. Sinuses/Orbits: Minimal polypoid mucosal thickening within the sphenoid sinus. Remaining paranasal sinuses are clear. Other: None. IMPRESSION: 1. No acute intracranial process. Electronically Signed   By: Randa Ngo M.D.   On: 10/29/2020 16:01   CT Chest Wo Contrast  Result Date: 10/29/2020 CLINICAL DATA:  Cough, persistent.  Syncopal episode. EXAM: CT CHEST WITHOUT CONTRAST TECHNIQUE: Multidetector CT imaging of the chest was performed following the standard protocol without IV contrast. COMPARISON:  10/11/2020 and chest radiograph 10/29/2020 FINDINGS: Cardiovascular: Atherosclerotic calcifications in the thoracic aorta without enlargement. Coronary artery calcifications. Heart size  is prominent without pericardial effusion. Mediastinum/Nodes: Prominent precarinal lymph node measures 1.3 cm on sequence 2 image 53. Additional small mediastinal lymph nodes. Lymph node in the AP window measures 1.2 cm on sequence 2 image 57. Limited evaluation for hilar lymph nodes due to the lack of IV contrast. No axillary lymph node enlargement. Lungs/Pleura: New right pleural effusion. The effusion is small to moderate in size. Trachea and mainstem bronchi are patent. Near complete consolidation and volume loss in the right lower lobe. This finding is new since 10/11/2020. Nodule along the right minor fissure measures 6 x 4 mm, mean 5 mm. This nodule is best seen on sequence 2 image 81. Small peripheral calcified granuloma in right middle lobe on image 93. 4 mm nodule in the left upper lobe on sequence 3 image 62. Small nodule along the left major fissure on image 68. No significant airspace disease or consolidation in the left lung. Upper Abdomen: Cholecystectomy. Enlargement of the left hepatic lobe. Slight nodular contour of the liver and findings could be associated with cirrhosis. Small lymph nodes in the upper abdomen. No upper abdominal ascites. Musculoskeletal: No acute bone abnormality. IMPRESSION: 1. Small to moderate sized right pleural effusion with near complete collapse of the right lower lobe. Right lower lobe collapse could be related to compressive atelectasis but can not exclude underlying pneumonia. 2. Prominent mediastinal lymph nodes as described. Index lymph node measures 1.3 cm in the short axis. These lymph nodes could be reactive but indeterminate. Recommend follow-up to ensure stability. 3. Enlargement of the left hepatic lobe with slight nodular contour to the liver. Findings are suspicious for cirrhosis. 4. Aortic Atherosclerosis (ICD10-I70.0). Coronary artery calcifications. 5. Several small pulmonary nodules. Largest pulmonary nodule has a mean diameter of 5 mm. These nodules are  indeterminate. No follow-up needed if patient is low-risk (and has no known or suspected primary neoplasm). Non-contrast chest CT can be considered in 12 months if patient is high-risk. This recommendation follows the consensus statement: Guidelines for Management of Incidental Pulmonary Nodules Detected on CT Images: From the Fleischner Society 2017; Radiology 2017; 284:228-243. Electronically Signed   By: Markus Daft M.D.   On: 10/29/2020 16:14    Labs on Admission: I have personally reviewed following labs  CBC: Recent Labs  Lab 10/29/20 1237  WBC 11.9*  HGB 9.3*  HCT 30.4*  MCV 86.1  PLT 811   Basic Metabolic Panel: Recent Labs  Lab 10/29/20 1237  NA 132*  K 4.0  CL 85*  CO2 31  GLUCOSE 397*  BUN 36*  CREATININE 1.54*  CALCIUM 8.6*   GFR: Estimated Creatinine Clearance: 35.4 mL/min (A) (by C-G formula based on SCr of 1.54 mg/dL (H)).  Urine analysis:    Component Value Date/Time   COLORURINE YELLOW (A) 10/11/2020 1159   APPEARANCEUR CLEAR (A) 10/11/2020 1159   APPEARANCEUR Cloudy (A) 09/28/2017 1338   LABSPEC 1.006 10/11/2020 1159   LABSPEC 1.024 04/02/2014 0517   PHURINE 6.0 10/11/2020 1159   GLUCOSEU NEGATIVE 10/11/2020 1159   GLUCOSEU >=500 04/02/2014 0517   HGBUR SMALL (A) 10/11/2020 1159   BILIRUBINUR NEGATIVE 10/11/2020 1159   BILIRUBINUR Negative 09/28/2017 1338   BILIRUBINUR Negative 04/02/2014 [place] 10/11/2020 1159   PROTEINUR NEGATIVE 10/11/2020 1159   NITRITE NEGATIVE 10/11/2020 1159   LEUKOCYTESUR SMALL (A) 10/11/2020 1159   LEUKOCYTESUR Trace 04/02/2014 0517   Dr. Tobie Poet [facility]  If 7PM-7AM, please contact overnight-coverage provider If 7AM-7PM, please contact day coverage provider [email or web address]  10/29/2020, 7:52 PM

## 2020-10-29 NOTE — ED Triage Notes (Signed)
Pt comes into the ED via POV c/o syncopal episode this morning as well as increased SHOB and cough.  Pt states she does have COPD and emphysema, but the cough is different than normal and she hasnt felt well for the past week.  Pt presents labored in breathing at rest.  Pt was sitting on the bed when she had the syncopal episode so she denies hitting her head.  Pt presents in triage with a wet cough that is non-productive.

## 2020-10-29 NOTE — Consult Note (Signed)
Remdesivir - Pharmacy Brief Note   O:  ALT: 17 CXR: Cardiomegaly with small volume RIGHT pleural effusion. RIGHT basilar opacity likely to represent atelectasis, though early pneumonia could appear  similar.  Aortic Atherosclerosis (ICD10-I70.0).  SpO2: 93% on [place]   A/P:  Remdesivir 200 mg IVPB once followed by 100 mg IVPB daily x 4 days.   Berta Minor, RPh  10/29/2020 7:08 PM

## 2020-10-30 ENCOUNTER — Observation Stay: Payer: Medicare Other

## 2020-10-30 DIAGNOSIS — E1169 Type 2 diabetes mellitus with other specified complication: Secondary | ICD-10-CM | POA: Diagnosis present

## 2020-10-30 DIAGNOSIS — I350 Nonrheumatic aortic (valve) stenosis: Secondary | ICD-10-CM | POA: Diagnosis present

## 2020-10-30 DIAGNOSIS — J441 Chronic obstructive pulmonary disease with (acute) exacerbation: Secondary | ICD-10-CM | POA: Diagnosis present

## 2020-10-30 DIAGNOSIS — Z6839 Body mass index (BMI) 39.0-39.9, adult: Secondary | ICD-10-CM | POA: Diagnosis not present

## 2020-10-30 DIAGNOSIS — N1831 Chronic kidney disease, stage 3a: Secondary | ICD-10-CM | POA: Diagnosis present

## 2020-10-30 DIAGNOSIS — S0012XA Contusion of left eyelid and periocular area, initial encounter: Secondary | ICD-10-CM | POA: Diagnosis present

## 2020-10-30 DIAGNOSIS — M79641 Pain in right hand: Secondary | ICD-10-CM | POA: Diagnosis present

## 2020-10-30 DIAGNOSIS — U071 COVID-19: Secondary | ICD-10-CM | POA: Diagnosis present

## 2020-10-30 DIAGNOSIS — J1282 Pneumonia due to coronavirus disease 2019: Secondary | ICD-10-CM | POA: Diagnosis present

## 2020-10-30 DIAGNOSIS — J9621 Acute and chronic respiratory failure with hypoxia: Secondary | ICD-10-CM | POA: Diagnosis present

## 2020-10-30 DIAGNOSIS — R296 Repeated falls: Secondary | ICD-10-CM | POA: Diagnosis present

## 2020-10-30 DIAGNOSIS — W1839XA Other fall on same level, initial encounter: Secondary | ICD-10-CM | POA: Diagnosis present

## 2020-10-30 DIAGNOSIS — I5032 Chronic diastolic (congestive) heart failure: Secondary | ICD-10-CM | POA: Diagnosis present

## 2020-10-30 DIAGNOSIS — I2510 Atherosclerotic heart disease of native coronary artery without angina pectoris: Secondary | ICD-10-CM | POA: Diagnosis present

## 2020-10-30 DIAGNOSIS — E860 Dehydration: Secondary | ICD-10-CM | POA: Diagnosis present

## 2020-10-30 DIAGNOSIS — R590 Localized enlarged lymph nodes: Secondary | ICD-10-CM | POA: Diagnosis present

## 2020-10-30 DIAGNOSIS — S0011XA Contusion of right eyelid and periocular area, initial encounter: Secondary | ICD-10-CM | POA: Diagnosis present

## 2020-10-30 DIAGNOSIS — I480 Paroxysmal atrial fibrillation: Secondary | ICD-10-CM | POA: Diagnosis present

## 2020-10-30 DIAGNOSIS — J440 Chronic obstructive pulmonary disease with acute lower respiratory infection: Secondary | ICD-10-CM | POA: Diagnosis present

## 2020-10-30 DIAGNOSIS — M79642 Pain in left hand: Secondary | ICD-10-CM | POA: Diagnosis present

## 2020-10-30 DIAGNOSIS — D638 Anemia in other chronic diseases classified elsewhere: Secondary | ICD-10-CM | POA: Diagnosis present

## 2020-10-30 DIAGNOSIS — I130 Hypertensive heart and chronic kidney disease with heart failure and stage 1 through stage 4 chronic kidney disease, or unspecified chronic kidney disease: Secondary | ICD-10-CM | POA: Diagnosis present

## 2020-10-30 DIAGNOSIS — N179 Acute kidney failure, unspecified: Secondary | ICD-10-CM | POA: Diagnosis present

## 2020-10-30 DIAGNOSIS — E1165 Type 2 diabetes mellitus with hyperglycemia: Secondary | ICD-10-CM | POA: Diagnosis not present

## 2020-10-30 DIAGNOSIS — R55 Syncope and collapse: Secondary | ICD-10-CM | POA: Diagnosis present

## 2020-10-30 LAB — CBC WITH DIFFERENTIAL/PLATELET
Abs Immature Granulocytes: 0.04 10*3/uL (ref 0.00–0.07)
Basophils Absolute: 0.1 10*3/uL (ref 0.0–0.1)
Basophils Relative: 1 %
Eosinophils Absolute: 0.4 10*3/uL (ref 0.0–0.5)
Eosinophils Relative: 5 %
HCT: 28.9 % — ABNORMAL LOW (ref 36.0–46.0)
Hemoglobin: 9 g/dL — ABNORMAL LOW (ref 12.0–15.0)
Immature Granulocytes: 1 %
Lymphocytes Relative: 14 %
Lymphs Abs: 1.1 10*3/uL (ref 0.7–4.0)
MCH: 26.2 pg (ref 26.0–34.0)
MCHC: 31.1 g/dL (ref 30.0–36.0)
MCV: 84.3 fL (ref 80.0–100.0)
Monocytes Absolute: 0.7 10*3/uL (ref 0.1–1.0)
Monocytes Relative: 8 %
Neutro Abs: 6 10*3/uL (ref 1.7–7.7)
Neutrophils Relative %: 71 %
Platelets: 252 10*3/uL (ref 150–400)
RBC: 3.43 MIL/uL — ABNORMAL LOW (ref 3.87–5.11)
RDW: 14.7 % (ref 11.5–15.5)
WBC: 8.3 10*3/uL (ref 4.0–10.5)
nRBC: 0 % (ref 0.0–0.2)

## 2020-10-30 LAB — COMPREHENSIVE METABOLIC PANEL
ALT: 17 U/L (ref 0–44)
AST: 16 U/L (ref 15–41)
Albumin: 3.4 g/dL — ABNORMAL LOW (ref 3.5–5.0)
Alkaline Phosphatase: 64 U/L (ref 38–126)
Anion gap: 6 (ref 5–15)
BUN: 35 mg/dL — ABNORMAL HIGH (ref 8–23)
CO2: 38 mmol/L — ABNORMAL HIGH (ref 22–32)
Calcium: 8.7 mg/dL — ABNORMAL LOW (ref 8.9–10.3)
Chloride: 91 mmol/L — ABNORMAL LOW (ref 98–111)
Creatinine, Ser: 1.19 mg/dL — ABNORMAL HIGH (ref 0.44–1.00)
GFR, Estimated: 47 mL/min — ABNORMAL LOW (ref >60)
Glucose, Bld: 224 mg/dL — ABNORMAL HIGH (ref 70–99)
Potassium: 4 mmol/L (ref 3.5–5.1)
Sodium: 135 mmol/L (ref 135–145)
Total Bilirubin: 0.7 mg/dL (ref 0.3–1.2)
Total Protein: 6.8 g/dL (ref 6.5–8.1)

## 2020-10-30 LAB — GLUCOSE, CAPILLARY
Glucose-Capillary: 291 mg/dL — ABNORMAL HIGH (ref 70–99)
Glucose-Capillary: 306 mg/dL — ABNORMAL HIGH (ref 70–99)
Glucose-Capillary: 257 mg/dL — ABNORMAL HIGH (ref 70–99)
Glucose-Capillary: 310 mg/dL — ABNORMAL HIGH (ref 70–99)

## 2020-10-30 LAB — C-REACTIVE PROTEIN: CRP: 2.4 mg/dL — ABNORMAL HIGH (ref <1.0)

## 2020-10-30 LAB — D-DIMER, QUANTITATIVE: D-Dimer, Quant: 1.05 ug/mL-FEU — ABNORMAL HIGH (ref 0.00–0.50)

## 2020-10-30 MED ORDER — METHYLPREDNISOLONE SODIUM SUCC 125 MG IJ SOLR
1.0000 mg/kg | Freq: Two times a day (BID) | INTRAMUSCULAR | Status: DC
  Administered 2020-10-30 – 2020-10-31 (×2): 72.5 mg via INTRAVENOUS
  Filled 2020-10-30 (×2): qty 2

## 2020-10-30 MED ORDER — SODIUM CHLORIDE 0.9 % IV SOLN
INTRAVENOUS | Status: DC | PRN
Start: 2020-10-30 — End: 2020-11-09

## 2020-10-30 NOTE — Progress Notes (Signed)
Writer arrived to pt room after hearing loud noise, pt observed laying on floor, pt states she was going to the bathroom, skin tear noted to left and right hand, hematoma to left forehead, no complaints of pain at this time, MD notified, head CT to be obtained, husband notified via voicemail/called back, currently resting in bed, no acute distress noted at this time, bed in lowest position, call light within reach, bed alarm active

## 2020-10-30 NOTE — Progress Notes (Signed)
PROGRESS NOTE    Karen [name]   [id number]  DOB: 07-Jul-1945  PCP: Earlie Counts, FNP    DOA: 10/29/2020 LOS: 0    Brief Narrative / Hospital Course to Date:   75 y.o. female with medical history significant for COPD on 3 L/min home O2, obesity, history of tobacco use, CAD, insulin-dependent diabetes mellitus, GERD, hyperlipidemia, hypertension, presented to the ED for evaluation syncope.  She reported recent myalgias, mild cough and generalized weakness.  Pt found Covid-19+ with imaging suspicious for possible pneumonia.  Admitted to [facility] service and being treated with remdesivir and IV steroids.  Assessment & Plan   Principal Problem:   Pneumonia due to COVID-19 virus Active Problems:   Weakness   Leucocytosis   COPD (chronic obstructive pulmonary disease) (HCC)   Hypertension   AKI (acute kidney injury) ([place])   Type 2 diabetes mellitus with hyperlipidemia (HCC)   CKD (chronic kidney disease), stage IIIa   (HFpEF) heart failure with preserved ejection fraction (HCC)   Morbid obesity (HCC)   AF (paroxysmal atrial fibrillation) (HCC)   Pulmonary nodules/lesions, multiple   Syncope likely due to hypoxia in the setting of COVID-19 infection and right pleural effusion with history of advanced COPD and acute on chronic respiratory failure with hypoxia -  Continue remdesivir and IV Solu-Medrol. Procalcitonin negative so antibiotics deferred, monitor. Supportive care with bronchodilators, COPD regimen with inhalers substituted for nebs. Pulmonary hygiene with I-S and flutter valve Monitor inflammatory markers, CMP, CBC and D-dimer. Maintain O2 sat greater than 88%    AKI superimposed on CKD stage IIIa -likely prerenal azotemia.  Presented with creatinine 1.54.  Baseline creatinine appears 0.86-1.25.   Hold home HCTZ and losartan. Encourage p.o. hydration and monitor renal function. Was given Lasix in the ED, hold IV fluids in setting of COVID infection. Caution if any  further diuresis. Monitor BMP. Avoid nephrotoxins and hypotension.  Right pleural effusion with right lower lobe atelectasis versus underlying infection -respiratory status is stable on her baseline supplemental oxygen.  This is likely chronic.  Given 40 IV Lasix once on admission. Will monitor with serial imaging if respiratory status worsening and consider thoracentesis.  History of hypertension -Home HCTZ, Lasix and losartan are on hold.  P.o. hydralazine as needed for now.  Hyperlipidemia -continue atorvastatin  Insulin-dependent type 2 diabetes -continue on basal insulin with sliding scale NovoLog.  Patient also on dulaglutide every Wednesday and metformin which are held.  Paroxysmal A. Fib -continue Eliquis.  Appears not on any rate control agents.  Heart rate controlled. Monitor on telemetry.  Multiple pulmonary nodules -seen on noncontrast chest CT.  Largest is 5 mm.  Outpatient follow-up and consideration for repeat CT chest in 12 months.  Mediastinal lymphadenopathy -likely due to acute COVID-19 infection.  Consider repeat imaging and follow-up once infection is cleared and further evaluation if indicated at that time.  GERD -on PPI  Obesity: Body mass index is 39.83 kg/m.  Complicates overall care and prognosis.  Recommend lifestyle modifications including physical activity and diet for weight loss and overall long-term health.   DVT prophylaxis: Place TED hose Start: 10/29/20 1650 apixaban (ELIQUIS) tablet 5 mg   Diet:  Diet Orders (From admission, onward)     Start     Ordered   10/29/20 1650  Diet heart healthy/carb modified Room service appropriate? Yes; Fluid consistency: Thin  Diet effective now       Question Answer Comment  Diet-HS Snack? Nothing   Room service appropriate?  Yes   Fluid consistency: Thin      10/29/20 1650              Code Status: Full Code   Subjective 10/30/20    Patient seen this morning, awake laying in bed.  She had a fall  earlier this morning when she got up by herself without calling for assistance to use the bathroom.  Was able to get up on her own.  She denies any injuries or pain related to this aside from a small bump on her forehead.  Sent for CT which was negative.  Overall just reports feeling weak and fatigue.  Mild shortness of breath and cough.   Disposition Plan & Communication   Status is: Inpatient  Remains inpatient appropriate because:IV treatments appropriate due to intensity of illness or inability to take PO  Dispo: The patient is from: Home              Anticipated d/c is to: Home              Patient currently is not medically stable to d/c.   Difficult to place patient No        Family Communication: Husband on speaker phone in patient's room during encounter today 8/27.   Consults, Procedures, Significant Events   Consultants:  None  Procedures:  None  Antimicrobials:  Anti-infectives (From admission, onward)    Start     Dose/Rate Route Frequency Ordered Stop   10/30/20 1000  remdesivir 100 mg in sodium chloride 0.9 % 100 mL IVPB       See Hyperspace for full Linked Orders Report.   100 mg 200 mL/hr over 30 Minutes Intravenous Daily 10/29/20 1650 11/03/20 0959   10/29/20 1800  remdesivir 200 mg in sodium chloride 0.9% 250 mL IVPB       See Hyperspace for full Linked Orders Report.   200 mg 580 mL/hr over 30 Minutes Intravenous Once 10/29/20 1650 10/29/20 1906         Micro    Objective   Vitals:   10/30/20 0800 10/30/20 1201 10/30/20 1215 10/30/20 1539  BP: (!) 131/95 137/69  137/61  Pulse: 90 91  87  Resp: (!) 22 (!) 22  20  Temp: 98 F (36.7 C) 97.6 F (36.4 C)    TempSrc:      SpO2: (!) 88% (!) 87% 90% 90%  Weight:      Height:        Intake/Output Summary (Last 24 hours) at 10/30/2020 1547 Last data filed at 10/29/2020 1906 Gross per 24 hour  Intake 290 ml  Output --  Net 290 ml   Filed Weights   10/29/20 1234  Weight: 102 kg     Physical Exam:  General exam: awake, alert, no acute distress, obese HEENT: Left forehead with small hematoma minimal ecchymosis, moist mucus membranes, hearing grossly normal  Respiratory system: Reduced breath sounds due to poor aeration, no wheezes heard, exam limited by inspirations causing cough, normal respiratory effort, on 3 L/min nasal cannula oxygen. Cardiovascular system: normal S1/S2, RRR   Gastrointestinal system: soft, NT, ND Central nervous system: A&O x4. no gross focal neurologic deficits, normal speech Skin: dry, intact, normal temperature Psychiatry: normal mood, congruent affect, judgement and insight appear normal  Labs   Data Reviewed: I have personally reviewed following labs and imaging studies  CBC: Recent Labs  Lab 10/29/20 1237 10/30/20 0416  WBC 11.9* 8.3  NEUTROABS  --  6.0  HGB 9.3* 9.0*  HCT 30.4* 28.9*  MCV 86.1 84.3  PLT 282 [id number]   Basic Metabolic Panel: Recent Labs  Lab 10/29/20 1237 10/30/20 0416  NA 132* 135  K 4.0 4.0  CL 85* 91*  CO2 31 38*  GLUCOSE 397* 224*  BUN 36* 35*  CREATININE 1.54* 1.19*  CALCIUM 8.6* 8.7*   GFR: Estimated Creatinine Clearance: 45.8 mL/min (A) (by C-G formula based on SCr of 1.19 mg/dL (H)). Liver Function Tests: Recent Labs  Lab 10/30/20 0416  AST 16  ALT 17  ALKPHOS 64  BILITOT 0.7  PROT 6.8  ALBUMIN 3.4*   No results for input(s): LIPASE, AMYLASE in the last 168 hours. No results for input(s): AMMONIA in the last 168 hours. Coagulation Profile: No results for input(s): INR, PROTIME in the last 168 hours. Cardiac Enzymes: No results for input(s): CKTOTAL, CKMB, CKMBINDEX, TROPONINI in the last 168 hours. BNP (last 3 results) No results for input(s): PROBNP in the last 8760 hours. HbA1C: Recent Labs    10/29/20 1237  HGBA1C 9.5*   CBG: Recent Labs  Lab 10/29/20 2041 10/30/20 0801 10/30/20 1203  GLUCAP 307* 291* 306*   Lipid Profile: No results for input(s): CHOL, HDL,  LDLCALC, TRIG, CHOLHDL, LDLDIRECT in the last 72 hours. Thyroid Function Tests: No results for input(s): TSH, T4TOTAL, FREET4, T3FREE, THYROIDAB in the last 72 hours. Anemia Panel: No results for input(s): VITAMINB12, FOLATE, FERRITIN, TIBC, IRON, RETICCTPCT in the last 72 hours. Sepsis Labs: Recent Labs  Lab 10/29/20 1418 10/29/20 1744  PROCALCITON <0.10  --   LATICACIDVEN 4.1* 1.9    Recent Results (from the past 240 hour(s))  Blood culture (routine x 2)     Status: None (Preliminary result)   Collection Time: 10/29/20  2:25 PM   Specimen: BLOOD  Result Value Ref Range Status   Specimen Description BLOOD BLOOD LEFT HAND  Final   Special Requests   Final    BOTTLES DRAWN AEROBIC AND ANAEROBIC Blood Culture adequate volume   Culture   Final    NO GROWTH < 24 HOURS Performed at [facility], [address], [place], [place] [address]    Report Status PENDING  Incomplete  Resp Panel by RT-PCR (Flu A&B, Covid) Nasopharyngeal Swab     Status: Abnormal   Collection Time: 10/29/20  2:26 PM   Specimen: Nasopharyngeal Swab; Nasopharyngeal(NP) swabs in vial transport medium  Result Value Ref Range Status   SARS Coronavirus 2 by RT PCR POSITIVE (A) NEGATIVE Final    Comment: RESULT CALLED TO, READ BACK BY AND VERIFIED WITH: ROBIN REGISTER AT 1559 ON 10/29/20 BY SS (NOTE) SARS-CoV-2 target nucleic acids are DETECTED.  The SARS-CoV-2 RNA is generally detectable in upper respiratory specimens during the acute phase of infection. Positive results are indicative of the presence of the identified virus, but do not rule out bacterial infection or co-infection with other pathogens not detected by the test. Clinical correlation with patient history and other diagnostic information is necessary to determine patient infection status. The expected result is Negative.  Fact Sheet for Patients: EntrepreneurPulse.com.au  Fact Sheet for Healthcare  Providers: IncredibleEmployment.be  This test is not yet approved or cleared by the [place] FDA and  has been authorized for detection and/or diagnosis of SARS-CoV-2 by FDA under an Emergency Use Authorization (EUA).  This EUA will remain in effect (meaning this test  can be used) for the duration of  the COVID-19 declaration under Section 564(b)(1) of the  Act, 21 U.S.C. section 360bbb-3(b)(1), unless the authorization is terminated or revoked sooner.     Influenza A by PCR NEGATIVE NEGATIVE Final   Influenza B by PCR NEGATIVE NEGATIVE Final    Comment: (NOTE) The Xpert Xpress SARS-CoV-2/FLU/RSV plus assay is intended as an aid in the diagnosis of influenza from Nasopharyngeal swab specimens and should not be used as a sole basis for treatment. Nasal washings and aspirates are unacceptable for Xpert Xpress SARS-CoV-2/FLU/RSV testing.  Fact Sheet for Patients: EntrepreneurPulse.com.au  Fact Sheet for Healthcare Providers: IncredibleEmployment.be  This test is not yet approved or cleared by the [place] FDA and has been authorized for detection and/or diagnosis of SARS-CoV-2 by FDA under an Emergency Use Authorization (EUA). This EUA will remain in effect (meaning this test can be used) for the duration of the COVID-19 declaration under Section 564(b)(1) of the Act, 21 U.S.C. section 360bbb-3(b)(1), unless the authorization is terminated or revoked.  Performed at [facility], [address] [place], [place] [address]   Blood culture (routine x 2)     Status: None (Preliminary result)   Collection Time: 10/29/20  2:28 PM   Specimen: BLOOD  Result Value Ref Range Status   Specimen Description BLOOD BLOOD RIGHT HAND  Final   Special Requests   Final    BOTTLES DRAWN AEROBIC AND ANAEROBIC Blood Culture adequate volume   Culture   Final    NO GROWTH < 24 HOURS Performed at [facility], [address], [place], [place] [address]    Report Status PENDING  Incomplete      Imaging Studies   DG Chest 2 View  Result Date: 10/29/2020 CLINICAL DATA:  Shortness of breath. EXAM: CHEST - 2 VIEW COMPARISON:  Multiple chest radiographs, most recently 07/14/2020. CT chest, 06/23/2016. FINDINGS: Enlargement of the cardiac silhouette. Aortic vascular calcifications. Hypoinflation. The LEFT lung is relatively clear with trace basilar streaky opacity. Layering, small volume RIGHT pleural effusion, with adjacent basilar consolidation. No pneumothorax. No acute osseous abnormality. IMPRESSION: 1. Cardiomegaly with small volume RIGHT pleural effusion. 2. RIGHT basilar opacity likely to represent atelectasis, though early pneumonia could appear similar. 3.  Aortic Atherosclerosis (ICD10-I70.0). Electronically Signed   By: Michaelle Birks M.D.   On: 10/29/2020 13:47   CT HEAD WO CONTRAST (5MM)  Result Date: 10/30/2020 CLINICAL DATA:  Minor head trauma. Hematoma to left forehead after fall. EXAM: CT HEAD WITHOUT CONTRAST TECHNIQUE: Contiguous axial images were obtained from the base of the skull through the vertex without intravenous contrast. COMPARISON:  October 29, 2020 FINDINGS: Brain: Prominent ventricles and sulci. No subdural, epidural, or subarachnoid hemorrhage. No mass effect or midline shift. No acute cortical ischemia or infarct. Cerebellum, brainstem, and basal cisterns are normal. Vascular: Calcified atherosclerosis is seen in the intracranial carotids. Skull: Normal. Negative for fracture or focal lesion. Sinuses/Orbits: No acute finding. Other: A hematoma seen over the left forehead. IMPRESSION: 1. No acute intracranial abnormalities. 2. Hematoma over the left forehead. 3. No other abnormalities. Electronically Signed   By: Dorise Bullion III M.D.   On: 10/30/2020 10:36   CT HEAD WO CONTRAST (5MM)  Result Date: 10/29/2020 CLINICAL DATA:  Loss of consciousness, syncope EXAM: CT HEAD WITHOUT  CONTRAST TECHNIQUE: Contiguous axial images were obtained from the base of the skull through the vertex without intravenous contrast. COMPARISON:  01/07/2015 FINDINGS: Brain: No acute infarct or hemorrhage. Lateral ventricles and midline structures are unremarkable. No acute extra-axial fluid collections. No mass effect. Vascular: No  hyperdense vessel or unexpected calcification. Skull: Normal. Negative for fracture or focal lesion. Sinuses/Orbits: Minimal polypoid mucosal thickening within the sphenoid sinus. Remaining paranasal sinuses are clear. Other: None. IMPRESSION: 1. No acute intracranial process. Electronically Signed   By: Randa Ngo M.D.   On: 10/29/2020 16:01   CT Chest Wo Contrast  Result Date: 10/29/2020 CLINICAL DATA:  Cough, persistent.  Syncopal episode. EXAM: CT CHEST WITHOUT CONTRAST TECHNIQUE: Multidetector CT imaging of the chest was performed following the standard protocol without IV contrast. COMPARISON:  10/11/2020 and chest radiograph 10/29/2020 FINDINGS: Cardiovascular: Atherosclerotic calcifications in the thoracic aorta without enlargement. Coronary artery calcifications. Heart size is prominent without pericardial effusion. Mediastinum/Nodes: Prominent precarinal lymph node measures 1.3 cm on sequence 2 image 53. Additional small mediastinal lymph nodes. Lymph node in the AP window measures 1.2 cm on sequence 2 image 57. Limited evaluation for hilar lymph nodes due to the lack of IV contrast. No axillary lymph node enlargement. Lungs/Pleura: New right pleural effusion. The effusion is small to moderate in size. Trachea and mainstem bronchi are patent. Near complete consolidation and volume loss in the right lower lobe. This finding is new since 10/11/2020. Nodule along the right minor fissure measures 6 x 4 mm, mean 5 mm. This nodule is best seen on sequence 2 image 81. Small peripheral calcified granuloma in right middle lobe on image 93. 4 mm nodule in the left upper lobe on  sequence 3 image 62. Small nodule along the left major fissure on image 68. No significant airspace disease or consolidation in the left lung. Upper Abdomen: Cholecystectomy. Enlargement of the left hepatic lobe. Slight nodular contour of the liver and findings could be associated with cirrhosis. Small lymph nodes in the upper abdomen. No upper abdominal ascites. Musculoskeletal: No acute bone abnormality. IMPRESSION: 1. Small to moderate sized right pleural effusion with near complete collapse of the right lower lobe. Right lower lobe collapse could be related to compressive atelectasis but can not exclude underlying pneumonia. 2. Prominent mediastinal lymph nodes as described. Index lymph node measures 1.3 cm in the short axis. These lymph nodes could be reactive but indeterminate. Recommend follow-up to ensure stability. 3. Enlargement of the left hepatic lobe with slight nodular contour to the liver. Findings are suspicious for cirrhosis. 4. Aortic Atherosclerosis (ICD10-I70.0). Coronary artery calcifications. 5. Several small pulmonary nodules. Largest pulmonary nodule has a mean diameter of 5 mm. These nodules are indeterminate. No follow-up needed if patient is low-risk (and has no known or suspected primary neoplasm). Non-contrast chest CT can be considered in 12 months if patient is high-risk. This recommendation follows the consensus statement: Guidelines for Management of Incidental Pulmonary Nodules Detected on CT Images: From the Fleischner Society 2017; Radiology 2017; 284:228-243. Electronically Signed   By: Markus Daft M.D.   On: 10/29/2020 16:14     Medications   Scheduled Meds:  albuterol  2 puff Inhalation Q4H while awake   apixaban  5 mg Oral BID   atorvastatin  10 mg Oral QHS   [START ON 11/03/2020] Dulaglutide  3 mg Subcutaneous Q Wed   fluticasone furoate-vilanterol  1 puff Inhalation Daily   And   umeclidinium bromide  1 puff Inhalation Daily   insulin aspart  0-5 Units  Subcutaneous QHS   insulin aspart  0-9 Units Subcutaneous TID WC   insulin glargine-yfgn  45 Units Subcutaneous QHS   montelukast  10 mg Oral QHS   pantoprazole  80 mg Oral Q1200   Continuous Infusions:  remdesivir 100 mg in NS 100 mL 100 mg (10/30/20 0924)       LOS: 0 days    Time spent: 30 minutes    Ezekiel Slocumb, DO [facility]  10/30/2020, 3:47 PM      If 7PM-7AM, please contact night-coverage. How to contact the [facility] Attending or Consulting provider [place] or covering provider during after hours [place], for this patient?    Check the care team in [facility] and look for a) attending/consulting [facility] provider listed and b) the [facility] team listed Log into [email or web address] and use [place]'s universal password to access. If you do not have the password, please contact the hospital operator. Locate the [facility] provider you are looking for under [facility] and page to a number that you can be directly reached. If you still have difficulty reaching the provider, please page the [facility] (Director on Call) for the Hospitalists listed on amion for assistance.

## 2020-10-30 NOTE — TOC Initial Note (Signed)
Transition of Care [facility]) - Initial/Assessment Note    Patient Details  Name: Karen [name] MRN: [id number] Date of Birth: [birth year]  Transition of Care [facility]) CM/SW Contact:    Shelbie Hutching, RN Phone Number: 10/30/2020, 4:19 PM  Clinical Narrative:                 High risk readmission assessment completed with patient via phone.  Patient lives at home with her husband and son.  Patient is on chronic O2 at 3 liters, she thinks the oxygen company is [employer].  Patient walks with a rollator.  She does not drive but husband provides transportation.  Patient is current with PCP. Patient declines home health services.    Expected Discharge Plan: Home/Self Care Barriers to Discharge: Continued Medical Work up   Patient Goals and CMS Choice Patient states their goals for this hospitalization and ongoing recovery are:: to feel better and go home      Expected Discharge Plan and Services Expected Discharge Plan: Home/Self Care       Living arrangements for the past 2 months: Single Family Home                 DME Arranged: N/A DME Agency: NA       HH Arranged: NA, Refused [place] Agency: NA        Prior Living Arrangements/Services Living arrangements for the past 2 months: Single Family Home Lives with:: Adult Children, Spouse Patient language and need for interpreter reviewed:: Yes Do you feel safe going back to the place where you live?: Yes      Need for Family Participation in Patient Care: Yes (Comment) (COVID) Care giver support system in place?: Yes (comment) (husband) Current home services: DME (oxygen and rollator) Criminal Activity/Legal Involvement Pertinent to Current Situation/Hospitalization: No - Comment as needed  Activities of Daily Living Home Assistive Devices/Equipment: Walker (specify type), Oxygen ADL Screening (condition at time of admission) Patient's cognitive ability adequate to safely complete daily activities?: Yes Is the patient deaf or  have difficulty hearing?: No Does the patient have difficulty seeing, even when wearing glasses/contacts?: No Does the patient have difficulty concentrating, remembering, or making decisions?: No Patient able to express need for assistance with ADLs?: Yes Does the patient have difficulty dressing or bathing?: No Independently performs ADLs?: Yes (appropriate for developmental age) Does the patient have difficulty walking or climbing stairs?: No Weakness of Legs: None Weakness of Arms/Hands: None  Permission Sought/Granted   Permission granted to share information with : No              Emotional Assessment   Attitude/Demeanor/Rapport: Engaged Affect (typically observed): Accepting, Flat Orientation: : Oriented to Self, Oriented to Place, Oriented to  Time, Oriented to Situation Alcohol / Substance Use: Not Applicable Psych Involvement: No (comment)  Admission diagnosis:  Syncope and collapse [R55] Weakness [R53.1] Pneumonia due to COVID-19 virus [U07.1, J12.82] COVID-19 [U07.1] Patient Active Problem List   Diagnosis Date Noted   Pneumonia due to COVID-19 virus 10/29/2020   Pulmonary nodules/lesions, multiple 10/29/2020   Aortic stenosis, moderate    AF (paroxysmal atrial fibrillation) (HCC)    Gastroesophageal reflux disease without esophagitis    Acute CHF (congestive heart failure) (HCC) 06/17/2020   Chronic respiratory failure with hypercapnia (HCC) 05/21/2020   Severe sepsis ([place]) 05/21/2020   UTI (urinary tract infection) 05/21/2020   Hyperglycemia due to type 2 diabetes mellitus ([place]) 05/21/2020   Abnormal CT of liver 05/21/2020  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 05/21/2020   Morbid obesity ([place]) 05/08/2020   Acute hip pain, left 10/23/2019   Lumbar stenosis with neurogenic claudication 10/23/2019   COPD with acute exacerbation ([place]) [id number]   Acute diastolic CHF (congestive heart failure) ([place]) 09/28/2019   (HFpEF) heart failure with preserved  ejection fraction ([place]) 09/27/2019   Rectal bleeding 06/28/2019   Hypokalemia 06/28/2019   Hyponatremia 06/28/2019   Type 2 diabetes mellitus with hyperlipidemia ([place]) 06/28/2019   CKD (chronic kidney disease), stage IIIa 06/28/2019   Atrial fibrillation, chronic ([place]) 06/28/2019   Pulmonary edema 02/27/2019   Bradycardia 02/24/2019   Chronic respiratory failure with hypoxia ([place]) 02/23/2019   Atypical chest pain 02/13/2019   Osteopenia of neck of left femur 11/20/2018   AVM (arteriovenous malformation) of small bowel, acquired    Acute gastric ulcer with hemorrhage    Chronic diastolic heart failure ([place]) 08/22/2018   Diarrhea 08/22/2018   Junctional bradycardia    Acute on chronic heart failure with preserved ejection fraction (HFpEF) ([place])    AKI (acute kidney injury) ([place])    Symptomatic bradycardia 08/05/2018   Acute on chronic respiratory failure ([place]) 06/25/2018   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 diabetes mellitus ([place]) 01/25/2018   History of non anemic vitamin B12 deficiency 01/25/2018   Personal history of kidney stones 11/12/2017   Urge incontinence 11/12/2017   History of leukocytosis 09/17/2017   Right ureteral stone 09/15/2017   Acute GI bleeding    GI bleed 08/26/2017   Arthritis 08/10/2017   Stage 4 chronic kidney disease ([place]) 08/10/2017   COPD (chronic obstructive pulmonary disease) ([place]) 08/10/2017   Diabetes mellitus type 2, uncomplicated ([place]) 0000000   Hypertension 08/10/2017   Obesity (BMI 35.0-39.9 without comorbidity) 04/11/2017   Primary osteoarthritis of right knee 09/01/2016   Leucocytosis 10/19/2015   Asterixis 01/07/2015   Acute on chronic respiratory failure with hypoxia and hypercapnia ([place]) 01/07/2015   Sciatica 01/07/2015   Weakness 01/07/2015   Chronic 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 01/04/2015   Iron deficiency anemia 06/22/2014   Microalbuminuria 06/22/2014   CHF (congestive heart failure) ([place])  04/20/2014   Edema, peripheral 04/20/2014   PCP:  Earlie Counts, FNP Pharmacy:   [facility] [address] [place] [address] Phone: [phone] Fax: [phone]     Social Determinants of Health (SDOH) Interventions    Readmission Risk Interventions Readmission Risk Prevention Plan 10/30/2020 06/21/2020 09/30/2019  Transportation Screening Complete Complete Complete  PCP or Specialist Appt within 5-7 Days - - -  PCP or Specialist Appt within 3-5 Days - - Complete  Home Care Screening - - -  Medication Review (RN CM) - - -  Social Work Consult for [place] Planning/Counseling - - Complete  Palliative Care Screening - - Not Applicable  Medication Review Press photographer) Complete Complete Complete  PCP or Specialist appointment within 3-5 days of discharge Complete Complete -  PCP/Specialist Appt Not Complete comments - - -  HRI or Home Care Consult Patient refused Complete -  SW Recovery Care/Counseling Consult Complete Complete -  Palliative Care Screening Not Applicable Not Applicable -  [place] Not Applicable Not Applicable -  Some recent data might be hidden

## 2020-10-31 LAB — COMPREHENSIVE METABOLIC PANEL
ALT: 29 U/L (ref 0–44)
AST: 39 U/L (ref 15–41)
Albumin: 3.5 g/dL (ref 3.5–5.0)
Alkaline Phosphatase: 71 U/L (ref 38–126)
Anion gap: 11 (ref 5–15)
BUN: 37 mg/dL — ABNORMAL HIGH (ref 8–23)
CO2: 34 mmol/L — ABNORMAL HIGH (ref 22–32)
Calcium: 9.5 mg/dL (ref 8.9–10.3)
Chloride: 88 mmol/L — ABNORMAL LOW (ref 98–111)
Creatinine, Ser: 0.94 mg/dL (ref 0.44–1.00)
GFR, Estimated: >60 mL/min (ref >60)
Glucose, Bld: 325 mg/dL — ABNORMAL HIGH (ref 70–99)
Potassium: 4.6 mmol/L (ref 3.5–5.1)
Sodium: 133 mmol/L — ABNORMAL LOW (ref 135–145)
Total Bilirubin: 0.8 mg/dL (ref 0.3–1.2)
Total Protein: 7.2 g/dL (ref 6.5–8.1)

## 2020-10-31 LAB — GLUCOSE, CAPILLARY
Glucose-Capillary: 342 mg/dL — ABNORMAL HIGH (ref 70–99)
Glucose-Capillary: 412 mg/dL — ABNORMAL HIGH (ref 70–99)
Glucose-Capillary: 186 mg/dL — ABNORMAL HIGH (ref 70–99)
Glucose-Capillary: 131 mg/dL — ABNORMAL HIGH (ref 70–99)

## 2020-10-31 LAB — CBC WITH DIFFERENTIAL/PLATELET
Abs Immature Granulocytes: 0.06 10*3/uL (ref 0.00–0.07)
Basophils Absolute: 0 10*3/uL (ref 0.0–0.1)
Basophils Relative: 0 %
Eosinophils Absolute: 0 10*3/uL (ref 0.0–0.5)
Eosinophils Relative: 0 %
HCT: 30.1 % — ABNORMAL LOW (ref 36.0–46.0)
Hemoglobin: 9.4 g/dL — ABNORMAL LOW (ref 12.0–15.0)
Immature Granulocytes: 1 %
Lymphocytes Relative: 7 %
Lymphs Abs: 0.8 10*3/uL (ref 0.7–4.0)
MCH: 25.5 pg — ABNORMAL LOW (ref 26.0–34.0)
MCHC: 31.2 g/dL (ref 30.0–36.0)
MCV: 81.6 fL (ref 80.0–100.0)
Monocytes Absolute: 0.2 10*3/uL (ref 0.1–1.0)
Monocytes Relative: 2 %
Neutro Abs: 9.4 10*3/uL — ABNORMAL HIGH (ref 1.7–7.7)
Neutrophils Relative %: 90 %
Platelets: 255 10*3/uL (ref 150–400)
RBC: 3.69 MIL/uL — ABNORMAL LOW (ref 3.87–5.11)
RDW: 14.7 % (ref 11.5–15.5)
WBC: 10.4 10*3/uL (ref 4.0–10.5)
nRBC: 0 % (ref 0.0–0.2)

## 2020-10-31 LAB — C-REACTIVE PROTEIN: CRP: 3 mg/dL — ABNORMAL HIGH (ref <1.0)

## 2020-10-31 LAB — D-DIMER, QUANTITATIVE: D-Dimer, Quant: 1.49 ug/mL-FEU — ABNORMAL HIGH (ref 0.00–0.50)

## 2020-10-31 LAB — MAGNESIUM: Magnesium: 2 mg/dL (ref 1.7–2.4)

## 2020-10-31 MED ORDER — INSULIN ASPART 100 UNIT/ML IJ SOLN
10.0000 [IU] | Freq: Once | INTRAMUSCULAR | Status: AC
  Administered 2020-10-31: 14:00:00 10 [IU] via SUBCUTANEOUS
  Filled 2020-10-31: qty 1

## 2020-10-31 MED ORDER — INSULIN ASPART 100 UNIT/ML IJ SOLN
0.0000 [IU] | Freq: Three times a day (TID) | INTRAMUSCULAR | Status: DC
  Administered 2020-10-31: 09:00:00 11 [IU] via SUBCUTANEOUS
  Administered 2020-10-31: 14:00:00 15 [IU] via SUBCUTANEOUS
  Administered 2020-10-31: 3 [IU] via SUBCUTANEOUS
  Administered 2020-11-01 (×2): 8 [IU] via SUBCUTANEOUS
  Administered 2020-11-01: 3 [IU] via SUBCUTANEOUS
  Administered 2020-11-02: 2 [IU] via SUBCUTANEOUS
  Administered 2020-11-02: 5 [IU] via SUBCUTANEOUS
  Administered 2020-11-02: 17:00:00 2 [IU] via SUBCUTANEOUS
  Filled 2020-10-31 (×8): qty 1

## 2020-10-31 MED ORDER — DM-GUAIFENESIN ER 30-600 MG PO TB12
1.0000 | ORAL_TABLET | Freq: Two times a day (BID) | ORAL | Status: DC
  Administered 2020-10-31 – 2020-11-09 (×19): 1 via ORAL
  Filled 2020-10-31 (×19): qty 1

## 2020-10-31 MED ORDER — INSULIN ASPART 100 UNIT/ML IJ SOLN
10.0000 [IU] | Freq: Three times a day (TID) | INTRAMUSCULAR | Status: DC
  Administered 2020-10-31 – 2020-11-01 (×6): 10 [IU] via SUBCUTANEOUS
  Filled 2020-10-31 (×5): qty 1

## 2020-10-31 MED ORDER — TRAZODONE HCL 50 MG PO TABS
50.0000 mg | ORAL_TABLET | Freq: Every day | ORAL | Status: DC
  Administered 2020-10-31 – 2020-11-08 (×9): 50 mg via ORAL
  Filled 2020-10-31 (×9): qty 1

## 2020-10-31 MED ORDER — PREDNISONE 20 MG PO TABS
40.0000 mg | ORAL_TABLET | Freq: Every day | ORAL | Status: DC
  Administered 2020-11-01 – 2020-11-03 (×3): 40 mg via ORAL
  Filled 2020-10-31 (×3): qty 2

## 2020-10-31 NOTE — Progress Notes (Signed)
PROGRESS NOTE    Karen [name]   [id number]  DOB: May 26, 1945  PCP: Earlie Counts, FNP    DOA: 10/29/2020 LOS: 1    Brief Narrative / Hospital Course to Date:   75 y.o. female with medical history significant for COPD on 3 L/min home O2, obesity, history of tobacco use, CAD, insulin-dependent diabetes mellitus, GERD, hyperlipidemia, hypertension, presented to the ED for evaluation syncope.  She reported recent myalgias, mild cough and generalized weakness.  Pt found Covid-19+ with imaging suspicious for possible pneumonia.  Admitted to [facility] service and being treated with remdesivir and IV steroids.  Assessment & Plan   Principal Problem:   Pneumonia due to COVID-19 virus Active Problems:   Weakness   Leucocytosis   COPD (chronic obstructive pulmonary disease) (HCC)   Hypertension   AKI (acute kidney injury) ([place])   Type 2 diabetes mellitus with hyperlipidemia (HCC)   CKD (chronic kidney disease), stage IIIa   (HFpEF) heart failure with preserved ejection fraction (HCC)   Morbid obesity (HCC)   AF (paroxysmal atrial fibrillation) (HCC)   Pulmonary nodules/lesions, multiple   Syncope likely due to hypoxia in the setting of COVID-19 infection and right pleural effusion with history of advanced COPD and acute on chronic respiratory failure with hypoxia -  Continue remdesivir  Transition IV Solu-Medrol to prednisone Procalcitonin negative so antibiotics deferred, monitor. Supportive care with bronchodilators, COPD regimen with inhalers substituted for nebs. Pulmonary hygiene with I-S and flutter valve Monitor inflammatory markers, CMP, CBC and D-dimer. Maintain O2 sat greater than 88%    AKI superimposed on CKD stage IIIa -POA, resolved. Likely prerenal azotemia.  Presented with creatinine 1.54.  Baseline creatinine appears 0.86-1.25.   Held home Lasix, HCTZ and losartan on admission. Encourage p.o. hydration and monitor renal function. Was given Lasix in the ED,  hold IV fluids in setting of COVID infection and baseline on diuretics. Monitor BMP. Avoid nephrotoxins and hypotension. Resume held meds as below.  Right pleural effusion with right lower lobe atelectasis versus underlying infection -respiratory status is stable on her baseline supplemental oxygen.  This is likely chronic.  Given 40 IV Lasix once on admission. Will monitor with serial imaging if respiratory status worsening and consider thoracentesis.  History of hypertension -Home HCTZ, Lasix and losartan held on admission due to AKI.   8/28 -resume Lasix and HCTZ, continue hold losartan Oral hydralazine as needed.   Hyperlipidemia -continue atorvastatin  Steroid-induced hyperglycemia -sugars in the 300s due to IV steroids.  Adding scheduled mealtime NovoLog and increase sliding scale. -- Monitor CBGs  -- Titrate insulin for inpatient goal 140-180  Insulin-dependent type 2 diabetes -continue on basal insulin with sliding scale NovoLog.  Patient also on dulaglutide every Wednesday and metformin which are held.  Paroxysmal A. Fib -continue Eliquis.  Appears not on any rate control agents.  Heart rate controlled. Monitor on telemetry.  Multiple pulmonary nodules -seen on noncontrast chest CT.  Largest is 5 mm.  Outpatient follow-up and consideration for repeat CT chest in 12 months.  Mediastinal lymphadenopathy -likely due to acute COVID-19 infection.  Consider repeat imaging and follow-up once infection is cleared and further evaluation if indicated at that time.  GERD -on PPI  Obesity: Body mass index is 39.83 kg/m.  Complicates overall care and prognosis.  Recommend lifestyle modifications including physical activity and diet for weight loss and overall long-term health.   DVT prophylaxis: Place TED hose Start: 10/29/20 1650 apixaban (ELIQUIS) tablet 5 mg   Diet:  Diet Orders (From admission, onward)     Start     Ordered   10/29/20 1650  Diet heart healthy/carb modified  Room service appropriate? Yes; Fluid consistency: Thin  Diet effective now       Question Answer Comment  Diet-HS Snack? Nothing   Room service appropriate? Yes   Fluid consistency: Thin      10/29/20 1650              Code Status: Full Code   Subjective 10/31/20    Patient awake laying in bed when seen this morning.  She reports not being able to sleep last night.  Would like to have something to help her sleep tonight.  She has ongoing nonproductive cough, states not able to get phlegm to come up.  No fevers or chills, chest pain or shortness of breath or other acute complaints..  We discussed her sugars running high due to IV steroids and will reduce steroids.   Disposition Plan & Communication   Status is: Inpatient  Remains inpatient appropriate because:IV treatments appropriate due to intensity of illness or inability to take PO  Dispo: The patient is from: Home              Anticipated d/c is to: Home              Patient currently is not medically stable to d/c.   Difficult to place patient No   Family Communication: Husband on speaker phone in patient's room during encounter 8/27.   Consults, Procedures, Significant Events   Consultants:  None  Procedures:  None  Antimicrobials:  Anti-infectives (From admission, onward)    Start     Dose/Rate Route Frequency Ordered Stop   10/30/20 1000  remdesivir 100 mg in sodium chloride 0.9 % 100 mL IVPB       See Hyperspace for full Linked Orders Report.   100 mg 200 mL/hr over 30 Minutes Intravenous Daily 10/29/20 1650 11/03/20 0959   10/29/20 1800  remdesivir 200 mg in sodium chloride 0.9% 250 mL IVPB       See Hyperspace for full Linked Orders Report.   200 mg 580 mL/hr over 30 Minutes Intravenous Once 10/29/20 1650 10/29/20 1906         Micro    Objective   Vitals:   10/31/20 0612 10/31/20 0627 10/31/20 1032 10/31/20 1256  BP:  (!) 158/94 (!) 147/79 (!) 153/72  Pulse: 84 80 88 93  Resp:   18 16   Temp:   (!) 97.3 F (36.3 C) 97.6 F (36.4 C)  TempSrc:      SpO2: (!) 89% 92% 91% 95%  Weight:      Height:        Intake/Output Summary (Last 24 hours) at 10/31/2020 1508 Last data filed at 10/31/2020 1340 Gross per 24 hour  Intake 100 ml  Output 1150 ml  Net -1050 ml   Filed Weights   10/29/20 1234  Weight: 102 kg    Physical Exam:  General exam: awake, alert, no acute distress, obese HEENT: Left forehead ecchymosis with small hematoma, bilateral periorbital ecchymosis, hearing grossly normal  Respiratory system: decreased breath sounds, normal respiratory effort at rest, on 3 L/min nasal cannula oxygen. Cardiovascular system: normal S1/S2, RRR, no edema   Gastrointestinal system: soft, non-tender abdomen Central nervous system: A&O x4. no gross focal neurologic deficits, normal speech Psychiatry: normal mood, congruent affect, judgement and insight appear normal  Labs   Data Reviewed:  I have personally reviewed following labs and imaging studies  CBC: Recent Labs  Lab 10/29/20 1237 10/30/20 0416 10/31/20 0638  WBC 11.9* 8.3 10.4  NEUTROABS  --  6.0 9.4*  HGB 9.3* 9.0* 9.4*  HCT 30.4* 28.9* 30.1*  MCV 86.1 84.3 81.6  PLT 282 252 [id number]   Basic Metabolic Panel: Recent Labs  Lab 10/29/20 1237 10/30/20 0416 10/31/20 0638  NA 132* 135 133*  K 4.0 4.0 4.6  CL 85* 91* 88*  CO2 31 38* 34*  GLUCOSE 397* 224* 325*  BUN 36* 35* 37*  CREATININE 1.54* 1.19* 0.94  CALCIUM 8.6* 8.7* 9.5  MG  --   --  2.0   GFR: Estimated Creatinine Clearance: 58 mL/min (by C-G formula based on SCr of 0.94 mg/dL). Liver Function Tests: Recent Labs  Lab 10/30/20 0416 10/31/20 0638  AST 16 39  ALT 17 29  ALKPHOS 64 71  BILITOT 0.7 0.8  PROT 6.8 7.2  ALBUMIN 3.4* 3.5   No results for input(s): LIPASE, AMYLASE in the last 168 hours. No results for input(s): AMMONIA in the last 168 hours. Coagulation Profile: No results for input(s): INR, PROTIME in the last 168  hours. Cardiac Enzymes: No results for input(s): CKTOTAL, CKMB, CKMBINDEX, TROPONINI in the last 168 hours. BNP (last 3 results) No results for input(s): PROBNP in the last 8760 hours. HbA1C: Recent Labs    10/29/20 1237  HGBA1C 9.5*   CBG: Recent Labs  Lab 10/30/20 1203 10/30/20 1652 10/30/20 2134 10/31/20 0912 10/31/20 1323  GLUCAP 306* 257* 310* 342* 412*   Lipid Profile: No results for input(s): CHOL, HDL, LDLCALC, TRIG, CHOLHDL, LDLDIRECT in the last 72 hours. Thyroid Function Tests: No results for input(s): TSH, T4TOTAL, FREET4, T3FREE, THYROIDAB in the last 72 hours. Anemia Panel: No results for input(s): VITAMINB12, FOLATE, FERRITIN, TIBC, IRON, RETICCTPCT in the last 72 hours. Sepsis Labs: Recent Labs  Lab 10/29/20 1418 10/29/20 1744  PROCALCITON <0.10  --   LATICACIDVEN 4.1* 1.9    Recent Results (from the past 240 hour(s))  Blood culture (routine x 2)     Status: None (Preliminary result)   Collection Time: 10/29/20  2:25 PM   Specimen: BLOOD  Result Value Ref Range Status   Specimen Description BLOOD BLOOD LEFT HAND  Final   Special Requests   Final    BOTTLES DRAWN AEROBIC AND ANAEROBIC Blood Culture adequate volume   Culture   Final    NO GROWTH 2 DAYS Performed at [facility], [address], [place], [place] [address]    Report Status PENDING  Incomplete  Resp Panel by RT-PCR (Flu A&B, Covid) Nasopharyngeal Swab     Status: Abnormal   Collection Time: 10/29/20  2:26 PM   Specimen: Nasopharyngeal Swab; Nasopharyngeal(NP) swabs in vial transport medium  Result Value Ref Range Status   SARS Coronavirus 2 by RT PCR POSITIVE (A) NEGATIVE Final    Comment: RESULT CALLED TO, READ BACK BY AND VERIFIED WITH: ROBIN REGISTER AT 1559 ON 10/29/20 BY SS (NOTE) SARS-CoV-2 target nucleic acids are DETECTED.  The SARS-CoV-2 RNA is generally detectable in upper respiratory specimens during the acute phase of infection. Positive results  are indicative of the presence of the identified virus, but do not rule out bacterial infection or co-infection with other pathogens not detected by the test. Clinical correlation with patient history and other diagnostic information is necessary to determine patient infection status. The expected result is Negative.  Fact Sheet for Patients:  EntrepreneurPulse.com.au  Fact Sheet for Healthcare Providers: IncredibleEmployment.be  This test is not yet approved or cleared by the [place] FDA and  has been authorized for detection and/or diagnosis of SARS-CoV-2 by FDA under an Emergency Use Authorization (EUA).  This EUA will remain in effect (meaning this test  can be used) for the duration of  the COVID-19 declaration under Section 564(b)(1) of the Act, 21 U.S.C. section 360bbb-3(b)(1), unless the authorization is terminated or revoked sooner.     Influenza A by PCR NEGATIVE NEGATIVE Final   Influenza B by PCR NEGATIVE NEGATIVE Final    Comment: (NOTE) The Xpert Xpress SARS-CoV-2/FLU/RSV plus assay is intended as an aid in the diagnosis of influenza from Nasopharyngeal swab specimens and should not be used as a sole basis for treatment. Nasal washings and aspirates are unacceptable for Xpert Xpress SARS-CoV-2/FLU/RSV testing.  Fact Sheet for Patients: EntrepreneurPulse.com.au  Fact Sheet for Healthcare Providers: IncredibleEmployment.be  This test is not yet approved or cleared by the [place] FDA and has been authorized for detection and/or diagnosis of SARS-CoV-2 by FDA under an Emergency Use Authorization (EUA). This EUA will remain in effect (meaning this test can be used) for the duration of the COVID-19 declaration under Section 564(b)(1) of the Act, 21 U.S.C. section 360bbb-3(b)(1), unless the authorization is terminated or revoked.  Performed at [facility], [address] [place], [place] [address]   Blood culture (routine x 2)     Status: None (Preliminary result)   Collection Time: 10/29/20  2:28 PM   Specimen: BLOOD  Result Value Ref Range Status   Specimen Description BLOOD BLOOD RIGHT HAND  Final   Special Requests   Final    BOTTLES DRAWN AEROBIC AND ANAEROBIC Blood Culture adequate volume   Culture   Final    NO GROWTH 2 DAYS Performed at [facility], [address], [place],  [address]    Report Status PENDING  Incomplete      Imaging Studies   CT HEAD WO CONTRAST (5MM)  Result Date: 10/30/2020 CLINICAL DATA:  Minor head trauma. Hematoma to left forehead after fall. EXAM: CT HEAD WITHOUT CONTRAST TECHNIQUE: Contiguous axial images were obtained from the base of the skull through the vertex without intravenous contrast. COMPARISON:  October 29, 2020 FINDINGS: Brain: Prominent ventricles and sulci. No subdural, epidural, or subarachnoid hemorrhage. No mass effect or midline shift. No acute cortical ischemia or infarct. Cerebellum, brainstem, and basal cisterns are normal. Vascular: Calcified atherosclerosis is seen in the intracranial carotids. Skull: Normal. Negative for fracture or focal lesion. Sinuses/Orbits: No acute finding. Other: A hematoma seen over the left forehead. IMPRESSION: 1. No acute intracranial abnormalities. 2. Hematoma over the left forehead. 3. No other abnormalities. Electronically Signed   By: Dorise Bullion III M.D.   On: 10/30/2020 10:36   CT HEAD WO CONTRAST (5MM)  Result Date: 10/29/2020 CLINICAL DATA:  Loss of consciousness, syncope EXAM: CT HEAD WITHOUT CONTRAST TECHNIQUE: Contiguous axial images were obtained from the base of the skull through the vertex without intravenous contrast. COMPARISON:  01/07/2015 FINDINGS: Brain: No acute infarct or hemorrhage. Lateral ventricles and midline structures are unremarkable. No acute extra-axial fluid collections. No mass effect. Vascular: No hyperdense vessel or  unexpected calcification. Skull: Normal. Negative for fracture or focal lesion. Sinuses/Orbits: Minimal polypoid mucosal thickening within the sphenoid sinus. Remaining paranasal sinuses are clear. Other: None. IMPRESSION: 1. No acute intracranial process. Electronically Signed   By: Diana Eves.D.  On: 10/29/2020 16:01   CT Chest Wo Contrast  Result Date: 10/29/2020 CLINICAL DATA:  Cough, persistent.  Syncopal episode. EXAM: CT CHEST WITHOUT CONTRAST TECHNIQUE: Multidetector CT imaging of the chest was performed following the standard protocol without IV contrast. COMPARISON:  10/11/2020 and chest radiograph 10/29/2020 FINDINGS: Cardiovascular: Atherosclerotic calcifications in the thoracic aorta without enlargement. Coronary artery calcifications. Heart size is prominent without pericardial effusion. Mediastinum/Nodes: Prominent precarinal lymph node measures 1.3 cm on sequence 2 image 53. Additional small mediastinal lymph nodes. Lymph node in the AP window measures 1.2 cm on sequence 2 image 57. Limited evaluation for hilar lymph nodes due to the lack of IV contrast. No axillary lymph node enlargement. Lungs/Pleura: New right pleural effusion. The effusion is small to moderate in size. Trachea and mainstem bronchi are patent. Near complete consolidation and volume loss in the right lower lobe. This finding is new since 10/11/2020. Nodule along the right minor fissure measures 6 x 4 mm, mean 5 mm. This nodule is best seen on sequence 2 image 81. Small peripheral calcified granuloma in right middle lobe on image 93. 4 mm nodule in the left upper lobe on sequence 3 image 62. Small nodule along the left major fissure on image 68. No significant airspace disease or consolidation in the left lung. Upper Abdomen: Cholecystectomy. Enlargement of the left hepatic lobe. Slight nodular contour of the liver and findings could be associated with cirrhosis. Small lymph nodes in the upper abdomen. No upper abdominal  ascites. Musculoskeletal: No acute bone abnormality. IMPRESSION: 1. Small to moderate sized right pleural effusion with near complete collapse of the right lower lobe. Right lower lobe collapse could be related to compressive atelectasis but can not exclude underlying pneumonia. 2. Prominent mediastinal lymph nodes as described. Index lymph node measures 1.3 cm in the short axis. These lymph nodes could be reactive but indeterminate. Recommend follow-up to ensure stability. 3. Enlargement of the left hepatic lobe with slight nodular contour to the liver. Findings are suspicious for cirrhosis. 4. Aortic Atherosclerosis (ICD10-I70.0). Coronary artery calcifications. 5. Several small pulmonary nodules. Largest pulmonary nodule has a mean diameter of 5 mm. These nodules are indeterminate. No follow-up needed if patient is low-risk (and has no known or suspected primary neoplasm). Non-contrast chest CT can be considered in 12 months if patient is high-risk. This recommendation follows the consensus statement: Guidelines for Management of Incidental Pulmonary Nodules Detected on CT Images: From the Fleischner Society 2017; Radiology 2017; 284:228-243. Electronically Signed   By: Markus Daft M.D.   On: 10/29/2020 16:14     Medications   Scheduled Meds:  apixaban  5 mg Oral BID   atorvastatin  10 mg Oral QHS   dextromethorphan-guaiFENesin  1 tablet Oral BID   [START ON 11/03/2020] Dulaglutide  3 mg Subcutaneous Q Wed   fluticasone furoate-vilanterol  1 puff Inhalation Daily   And   umeclidinium bromide  1 puff Inhalation Daily   insulin aspart  0-15 Units Subcutaneous TID WC   insulin aspart  0-5 Units Subcutaneous QHS   insulin aspart  10 Units Subcutaneous TID WC   insulin glargine-yfgn  45 Units Subcutaneous QHS   montelukast  10 mg Oral QHS   pantoprazole  80 mg Oral Q1200   [START ON 11/01/2020] predniSONE  40 mg Oral Q breakfast   traZODone  50 mg Oral QHS   Continuous Infusions:  sodium chloride      remdesivir 100 mg in NS 100 mL 100 mg (10/31/20 0934)  LOS: 1 day    Time spent: 30 minutes    Ezekiel Slocumb, DO [facility]  10/31/2020, 3:08 PM      If 7PM-7AM, please contact night-coverage. How to contact the [facility] Attending or Consulting provider [place] or covering provider during after hours [place], for this patient?    Check the care team in [facility] and look for a) attending/consulting [facility] provider listed and b) the [facility] team listed Log into [email or web address] and use [place]'s universal password to access. If you do not have the password, please contact the hospital operator. Locate the [facility] provider you are looking for under [facility] and page to a number that you can be directly reached. If you still have difficulty reaching the provider, please page the [facility] (Director on Call) for the Hospitalists listed on amion for assistance.

## 2020-10-31 NOTE — Progress Notes (Signed)
Dr Arbutus Ped aware that BS 412, new order to give 35 units novolog total

## 2020-11-01 LAB — GLUCOSE, CAPILLARY
Glucose-Capillary: 153 mg/dL — ABNORMAL HIGH (ref 70–99)
Glucose-Capillary: 252 mg/dL — ABNORMAL HIGH (ref 70–99)
Glucose-Capillary: 253 mg/dL — ABNORMAL HIGH (ref 70–99)
Glucose-Capillary: 199 mg/dL — ABNORMAL HIGH (ref 70–99)

## 2020-11-01 LAB — COMPREHENSIVE METABOLIC PANEL
ALT: 23 U/L (ref 0–44)
AST: 21 U/L (ref 15–41)
Albumin: 3.1 g/dL — ABNORMAL LOW (ref 3.5–5.0)
Alkaline Phosphatase: 68 U/L (ref 38–126)
Anion gap: 7 (ref 5–15)
BUN: 39 mg/dL — ABNORMAL HIGH (ref 8–23)
CO2: 35 mmol/L — ABNORMAL HIGH (ref 22–32)
Calcium: 9 mg/dL (ref 8.9–10.3)
Chloride: 92 mmol/L — ABNORMAL LOW (ref 98–111)
Creatinine, Ser: 0.92 mg/dL (ref 0.44–1.00)
GFR, Estimated: >60 mL/min (ref >60)
Glucose, Bld: 127 mg/dL — ABNORMAL HIGH (ref 70–99)
Potassium: 3.7 mmol/L (ref 3.5–5.1)
Sodium: 134 mmol/L — ABNORMAL LOW (ref 135–145)
Total Bilirubin: 0.7 mg/dL (ref 0.3–1.2)
Total Protein: 6.2 g/dL — ABNORMAL LOW (ref 6.5–8.1)

## 2020-11-01 LAB — CBC WITH DIFFERENTIAL/PLATELET
Abs Immature Granulocytes: 0.04 10*3/uL (ref 0.00–0.07)
Basophils Absolute: 0.1 10*3/uL (ref 0.0–0.1)
Basophils Relative: 1 %
Eosinophils Absolute: 0.3 10*3/uL (ref 0.0–0.5)
Eosinophils Relative: 3 %
HCT: 28.1 % — ABNORMAL LOW (ref 36.0–46.0)
Hemoglobin: 8.8 g/dL — ABNORMAL LOW (ref 12.0–15.0)
Immature Granulocytes: 0 %
Lymphocytes Relative: 17 %
Lymphs Abs: 1.6 10*3/uL (ref 0.7–4.0)
MCH: 25.9 pg — ABNORMAL LOW (ref 26.0–34.0)
MCHC: 31.3 g/dL (ref 30.0–36.0)
MCV: 82.6 fL (ref 80.0–100.0)
Monocytes Absolute: 0.8 10*3/uL (ref 0.1–1.0)
Monocytes Relative: 9 %
Neutro Abs: 6.6 10*3/uL (ref 1.7–7.7)
Neutrophils Relative %: 70 %
Platelets: 261 10*3/uL (ref 150–400)
RBC: 3.4 MIL/uL — ABNORMAL LOW (ref 3.87–5.11)
RDW: 14.9 % (ref 11.5–15.5)
WBC: 9.3 10*3/uL (ref 4.0–10.5)
nRBC: 0 % (ref 0.0–0.2)

## 2020-11-01 LAB — D-DIMER, QUANTITATIVE: D-Dimer, Quant: 1.16 ug/mL-FEU — ABNORMAL HIGH (ref 0.00–0.50)

## 2020-11-01 LAB — C-REACTIVE PROTEIN: CRP: 1.7 mg/dL — ABNORMAL HIGH (ref <1.0)

## 2020-11-01 MED ORDER — DICLOFENAC SODIUM 1 % EX GEL
2.0000 g | Freq: Four times a day (QID) | CUTANEOUS | Status: DC
  Administered 2020-11-01 – 2020-11-09 (×32): 2 g via TOPICAL
  Filled 2020-11-01: qty 100

## 2020-11-01 MED ORDER — INSULIN ASPART 100 UNIT/ML IJ SOLN
12.0000 [IU] | Freq: Three times a day (TID) | INTRAMUSCULAR | Status: DC
  Administered 2020-11-02 – 2020-11-04 (×9): 12 [IU] via SUBCUTANEOUS
  Filled 2020-11-01 (×9): qty 1

## 2020-11-01 MED ORDER — POTASSIUM CHLORIDE CRYS ER 20 MEQ PO TBCR
20.0000 meq | EXTENDED_RELEASE_TABLET | Freq: Once | ORAL | Status: AC
  Administered 2020-11-01: 20 meq via ORAL
  Filled 2020-11-01: qty 1

## 2020-11-01 NOTE — Evaluation (Signed)
Physical Therapy Evaluation Patient Details Name: Karen [name] MRN: [id number] DOB: May 18, 1945 Today's Date: 11/01/2020   History of Present Illness  Pt is a 75 y.o. female presenting to hospital 8/26 with loss of consciousness and SOB.  Pt admitted with syncope, acute hypoxemic respiratory failure secondary to COVID-19 infection (near complete collapse of R lower lobe of lung), AKI on CKD, and R pleural effusion.   S/p fall 10/30/20.  PMH includes COPD on 3 L O2 baseline, HF, anemia, DM, h/o blood clots on Eliquis, CKD, sciatica, and a-fib.  Clinical Impression  Prior to hospital admission, pt was independent with ambulation household distances; uses 3 L O2 baseline; and lives with her husband who assists pt with IADL's.  Currently pt is min assist with bed mobility and CGA with lateral scooting towards L along bed.  Pt's O2 sats 91% on 3 L O2 at rest beginning of session; O2 sats decreased to 80-81% sitting edge of bed (improved to 91% with vc's for pursed lip breathing and sitting rest); and after pt lateral scooted towards head of bed and layed down pt's O2 sats decreased again to 80-81% on 3 L O2 (therapist increased pt's O2 to 4 L to allow pt to recover and then titrated pt's O2 back down to 3 L end of session with pt satting 92%-93%--nurse notified of pt's O2 sats during session and O2 needs).  Pt would benefit from skilled PT to address noted impairments and functional limitations (see below for any additional details).  Upon hospital discharge, pt would benefit from SNF.    Follow Up Recommendations SNF    Equipment Recommendations  Rolling walker with 5" wheels;3in1 (PT);Wheelchair (measurements PT);Wheelchair cushion (measurements PT)    Recommendations for Other Services       Precautions / Restrictions Precautions Precautions: Fall Precaution Comments: monitor O2 Restrictions Weight Bearing Restrictions: No      Mobility  Bed Mobility Overal bed mobility: Needs  Assistance Bed Mobility: Supine to Sit;Sit to Supine     Supine to sit: Min assist Sit to supine: Min assist   General bed mobility comments: assist for trunk; vc's for technique    Transfers Overall transfer level: Needs assistance Equipment used: None Transfers: Lateral/Scoot Transfers          Lateral/Scoot Transfers: Min guard General transfer comment: vc's for technique; lateral scooting towards L along bed (towards [facility])  Ambulation/Gait             General Gait Details: deferred d/t O2 desaturation with activity  Stairs            Wheelchair Mobility    Modified Rankin (Stroke Patients Only)       Balance Overall balance assessment: Needs assistance Sitting-balance support: No upper extremity supported;Feet supported Sitting balance-Leahy Scale: Good Sitting balance - Comments: steady sitting reaching within BOS                                     Pertinent Vitals/Pain Pain Assessment: 0-10 Pain Score: 9  Pain Location: low back Pain Descriptors / Indicators: Aching;Discomfort Pain Intervention(s): Limited activity within patient's tolerance;Monitored during session;Repositioned;Patient requesting pain meds-RN notified HR WFL during sessions activities.    Home Living Family/patient expects to be discharged to:: Private residence Living Arrangements: Spouse/significant other;Children (pt's husband and pt's son) Available Help at Discharge: Family;Available 24 hours/day Type of Home: Mobile home Home Access: Ramped entrance  Home Layout: One level Home Equipment: Walker - 2 wheels;Grab bars - tub/shower;Grab bars - toilet      Prior Function Level of Independence: Independent with assistive device(s)         Comments: Ambulatory household distances with RW; 3 L home O2 chronic; husband assists with IADL's and taking pt to appts; no other recent falls (other than fall in hospital).     Hand Dominance   Dominant  Hand: Right    Extremity/Trunk Assessment   Upper Extremity Assessment Upper Extremity Assessment: Generalized weakness    Lower Extremity Assessment Lower Extremity Assessment: Generalized weakness    Cervical / Trunk Assessment Cervical / Trunk Assessment: Other exceptions Cervical / Trunk Exceptions: forward head/shoulders  Communication   Communication: No difficulties  Cognition Arousal/Alertness: Awake/alert Behavior During Therapy: WFL for tasks assessed/performed Overall Cognitive Status: Within Functional Limits for tasks assessed                                        General Comments General comments (skin integrity, edema, etc.): bruising noted to forehead and B eyes.  Nursing cleared pt for participation in physical therapy.  Pt agreeable to PT session.    Exercises     Assessment/Plan    PT Assessment Patient needs continued PT services  PT Problem List Decreased strength;Decreased activity tolerance;Decreased balance;Decreased mobility;Decreased knowledge of use of DME;Cardiopulmonary status limiting activity;Decreased knowledge of precautions       PT Treatment Interventions DME instruction;Gait training;Stair training;Functional mobility training;Therapeutic activities;Therapeutic exercise;Balance training;Patient/family education    PT Goals (Current goals can be found in the Care Plan section)  Acute Rehab PT Goals Patient Stated Goal: to improve breathing PT Goal Formulation: With patient Time For Goal Achievement: 11/15/20 Potential to Achieve Goals: Fair    Frequency Min 2X/week   Barriers to discharge Decreased caregiver support      Co-evaluation               AM-PAC PT "6 Clicks" Mobility  Outcome Measure Help needed turning from your back to your side while in a flat bed without using bedrails?: None Help needed moving from lying on your back to sitting on the side of a flat bed without using bedrails?: A  Little Help needed moving to and from a bed to a chair (including a wheelchair)?: A Little Help needed standing up from a chair using your arms (e.g., wheelchair or bedside chair)?: A Little Help needed to walk in hospital room?: A Lot Help needed climbing 3-5 steps with a railing? : Total 6 Click Score: 16    End of Session Equipment Utilized During Treatment: Gait belt;Oxygen Activity Tolerance: Other (comment) (limited d/t O2 desaturation with activity) Patient left: in bed;with call bell/phone within reach;with bed alarm set Nurse Communication: Mobility status;Precautions;Other (comment);Patient requests pain meds (pt's O2 desaturation with activity) PT Visit Diagnosis: Other abnormalities of gait and mobility (R26.89);Muscle weakness (generalized) (M62.81);Difficulty in walking, not elsewhere classified (R26.2)    Time: 1338-1410 PT Time Calculation (min) (ACUTE ONLY): 32 min   Charges:   PT Evaluation $PT Eval Low Complexity: 1 Low PT Treatments $Therapeutic Activity: 8-22 mins       Leitha Bleak, PT 11/01/20, 5:19 PM

## 2020-11-01 NOTE — Progress Notes (Signed)
Inpatient Diabetes Program Recommendations  AACE/ADA: New Consensus Statement on Inpatient Glycemic Control (2015)  Target Ranges:  Prepandial:   less than 140 mg/dL      Peak postprandial:   less than 180 mg/dL (1-2 hours)      Critically ill patients:  140 - 180 mg/dL   Results for ELENER, MUNUZ (MRN [id number]) as of 11/01/2020 12:47  Ref. Range 11/01/2020 08:23 11/01/2020 12:29  Glucose-Capillary Latest Ref Range: 70 - 99 mg/dL 153 (H)  13 units Novolog  252 (H)  18 units Novolog    Home DM Meds: Lantus 45 units QHS    Humalog 10 units TID    Metformin 123XX123 mg BID   Trulicity 3 mg Qweek   Current Orders: Semglee 45 units QHS  Novolog 0-15 units TID ac/hs  Novolog 10 units TID with meals    Note Novolog SSI Increased and Novolog Meal Coverage started yest AM.    Solumedrol stopped--last dose given yest at 4am--To start Prednisone 40 mg Daily today    MD- AM CBG improved today  While patient remains on Prednisone, please consider increasing the Novolog Meal Coverage slightly to 12 units TID with meals     --Will follow patient during hospitalization--  Wyn Quaker RN, MSN, CDE Diabetes Coordinator Inpatient Glycemic Control Team Team Pager: [phone] (8a-5p)

## 2020-11-01 NOTE — Progress Notes (Addendum)
PROGRESS NOTE    Karen [name]   [id number]  DOB: [birth year]  PCP: Earlie Counts, FNP    DOA: 10/29/2020 LOS: 2    Brief Narrative / Hospital Course to Date:   75 y.o. female with medical history significant for COPD on 3 L/min home O2, obesity, history of tobacco use, CAD, insulin-dependent diabetes mellitus, GERD, hyperlipidemia, hypertension, presented to the ED for evaluation syncope.  She reported recent myalgias, mild cough and generalized weakness.  Pt found Covid-19+ with imaging suspicious for possible pneumonia.  Admitted to [facility] service and being treated with remdesivir and IV steroids.  Assessment & Plan   Principal Problem:   Pneumonia due to COVID-19 virus Active Problems:   Weakness   Leucocytosis   COPD (chronic obstructive pulmonary disease) (HCC)   Hypertension   AKI (acute kidney injury) ([place])   Type 2 diabetes mellitus with hyperlipidemia (HCC)   CKD (chronic kidney disease), stage IIIa   (HFpEF) heart failure with preserved ejection fraction (HCC)   Morbid obesity (HCC)   AF (paroxysmal atrial fibrillation) (HCC)   Pulmonary nodules/lesions, multiple   Syncope likely due to hypoxia in the setting of COVID-19 infection and right pleural effusion with history of advanced COPD and acute on chronic respiratory failure with hypoxia -  8/29 - notified by RN that pt's spO2 dropped to 80% on 3 L/min while sitting edge of bed with therapy. Continue remdesivir  Initially given IV Solu-Medrol  Continue Prednisone Procalcitonin negative so antibiotics deferred, monitor. Supportive care with bronchodilators, COPD regimen with inhalers substituted for nebs. Pulmonary hygiene with I-S and flutter valve Monitor inflammatory markers, CMP, CBC and D-dimer. Maintain O2 sat greater than 88%    AKI superimposed on CKD stage IIIa -POA, resolved. Likely prerenal azotemia.  Presented with creatinine 1.54.  Baseline creatinine appears 0.86-1.25.   Held home Lasix,  HCTZ and losartan on admission. Encourage p.o. hydration and monitor renal function. Was given Lasix in the ED, hold IV fluids in setting of COVID infection and baseline on diuretics. Monitor BMP. Avoid nephrotoxins and hypotension. Resume held meds as below.  Right pleural effusion with right lower lobe atelectasis versus underlying infection -respiratory status is stable on her baseline supplemental oxygen.  This is likely chronic.  Given 40 IV Lasix once on admission. Will monitor with serial imaging if respiratory status worsening and consider thoracentesis. Repeat CXR tomorrow AM 8/30  History of hypertension -Home HCTZ, Lasix and losartan held on admission due to AKI.   8/28 -resume Lasix and HCTZ, continue hold losartan Oral hydralazine as needed.   Hyperlipidemia -continue atorvastatin  Steroid-induced hyperglycemia -sugars in the 300s due to IV steroids.  Adding scheduled mealtime NovoLog and increase sliding scale. -- Monitor CBGs  -- Titrate insulin for inpatient goal 140-180 8/29: increase scheduled novolog to 12 units tid wc  Insulin-dependent type 2 diabetes -continue on basal insulin with sliding scale NovoLog.  Patient also on dulaglutide every Wednesday and metformin which are held.  Paroxysmal A. Fib -continue Eliquis.  Appears not on any rate control agents.  Heart rate controlled. Monitor on telemetry.  Recurrent falls / Generalized Weakness / Ambulatory dysfunction - PT and OT evaluations.  SNF recommended.  TOC following.  Fall precautions.   Hand pain, bilateral - due to fall.  ROM intact in b/l hands and fingers, very unlikely any fracture.  Topical voltaren gel to painful areas on dorsal hands.  Will xray if worsening or persistent.   Forehead hematoma and bilateral periorbital  ecchymosis - due to her fall on day 1 when pt got up without assistance to try get to bathroom.  CT head negative.  No complaint of affected vision or eye pain.  Monitor.  Multiple  pulmonary nodules -seen on noncontrast chest CT.  Largest is 5 mm.  Outpatient follow-up and consideration for repeat CT chest in 12 months.  Mediastinal lymphadenopathy -likely due to acute COVID-19 infection.  Consider repeat imaging and follow-up once infection is cleared and further evaluation if indicated at that time.  GERD -on PPI  Obesity: Body mass index is 39.83 kg/m.  Complicates overall care and prognosis.  Recommend lifestyle modifications including physical activity and diet for weight loss and overall long-term health.   DVT prophylaxis: Place TED hose Start: 10/29/20 1650 apixaban (ELIQUIS) tablet 5 mg   Diet:  Diet Orders (From admission, onward)     Start     Ordered   10/29/20 1650  Diet heart healthy/carb modified Room service appropriate? Yes; Fluid consistency: Thin  Diet effective now       Question Answer Comment  Diet-HS Snack? Nothing   Room service appropriate? Yes   Fluid consistency: Thin      10/29/20 1650              Code Status: Full Code   Subjective 11/01/20    Patient awake laying in bed when seen this morning.  She states that both of her hands are hurting since her fall.  Denies shortness of breath except when she has coughing spells.  Says been coughing she feels like she cannot catch her breath.  Denies fevers chills, chest pain, nausea vomiting or other acute complaints.   Disposition Plan & Communication   Status is: Inpatient  Remains inpatient appropriate because:IV treatments appropriate due to intensity of illness or inability to take PO  Dispo: The patient is from: Home              Anticipated d/c is to: Home              Patient currently is not medically stable to d/c.   Difficult to place patient No   Family Communication: Husband on speaker phone in patient's room during encounter 8/27.   Consults, Procedures, Significant Events   Consultants:  None  Procedures:  None  Antimicrobials:  Anti-infectives  (From admission, onward)    Start     Dose/Rate Route Frequency Ordered Stop   10/30/20 1000  remdesivir 100 mg in sodium chloride 0.9 % 100 mL IVPB       See Hyperspace for full Linked Orders Report.   100 mg 200 mL/hr over 30 Minutes Intravenous Daily 10/29/20 1650 11/03/20 0959   10/29/20 1800  remdesivir 200 mg in sodium chloride 0.9% 250 mL IVPB       See Hyperspace for full Linked Orders Report.   200 mg 580 mL/hr over 30 Minutes Intravenous Once 10/29/20 1650 10/29/20 1906         Micro    Objective   Vitals:   10/31/20 2033 11/01/20 0604 11/01/20 0825 11/01/20 0928  BP: (!) 144/64 126/74 140/79   Pulse: 80 86 88   Resp: 18 (!) '25 18 20  '$ Temp: 97.6 F (36.4 C) 97.9 F (36.6 C) 98.1 F (36.7 C)   TempSrc:      SpO2: (!) 65% 93% (!) 89%   Weight:      Height:        Intake/Output Summary (Last  24 hours) at 11/01/2020 1708 Last data filed at 11/01/2020 1300 Gross per 24 hour  Intake --  Output 651 ml  Net -651 ml   Filed Weights   10/29/20 1234  Weight: 102 kg    Physical Exam:  General exam: awake, alert, no acute distress, obese HEENT: Left forehead ecchymosis with small hematoma, bilateral periorbital ecchymosis, hearing grossly normal  Respiratory system: decreased breath sounds but without wheezing, normal respiratory effort at rest, on 3 L/min nasal cannula oxygen. Cardiovascular system: normal S1/S2, RRR, no edema   Gastrointestinal system: soft, non-tender abdomen Central nervous system: A&O x4. no gross focal neurologic deficits, normal speech   Labs   Data Reviewed: I have personally reviewed following labs and imaging studies  CBC: Recent Labs  Lab 10/29/20 1237 10/30/20 0416 10/31/20 0638 11/01/20 0521  WBC 11.9* 8.3 10.4 9.3  NEUTROABS  --  6.0 9.4* 6.6  HGB 9.3* 9.0* 9.4* 8.8*  HCT 30.4* 28.9* 30.1* 28.1*  MCV 86.1 84.3 81.6 82.6  PLT 282 252 255 0000000   Basic Metabolic Panel: Recent Labs  Lab 10/29/20 1237 10/30/20 0416  10/31/20 0638 11/01/20 0521  NA 132* 135 133* 134*  K 4.0 4.0 4.6 3.7  CL 85* 91* 88* 92*  CO2 31 38* 34* 35*  GLUCOSE 397* 224* 325* 127*  BUN 36* 35* 37* 39*  CREATININE 1.54* 1.19* 0.94 0.92  CALCIUM 8.6* 8.7* 9.5 9.0  MG  --   --  2.0  --    GFR: Estimated Creatinine Clearance: 59.3 mL/min (by C-G formula based on SCr of 0.92 mg/dL). Liver Function Tests: Recent Labs  Lab 10/30/20 0416 10/31/20 0638 11/01/20 0521  AST 16 39 21  ALT '17 29 23  '$ ALKPHOS 64 71 68  BILITOT 0.7 0.8 0.7  PROT 6.8 7.2 6.2*  ALBUMIN 3.4* 3.5 3.1*   No results for input(s): LIPASE, AMYLASE in the last 168 hours. No results for input(s): AMMONIA in the last 168 hours. Coagulation Profile: No results for input(s): INR, PROTIME in the last 168 hours. Cardiac Enzymes: No results for input(s): CKTOTAL, CKMB, CKMBINDEX, TROPONINI in the last 168 hours. BNP (last 3 results) No results for input(s): PROBNP in the last 8760 hours. HbA1C: No results for input(s): HGBA1C in the last 72 hours.  CBG: Recent Labs  Lab 10/31/20 1701 10/31/20 2003 11/01/20 0823 11/01/20 1229 11/01/20 1551  GLUCAP 186* 131* 153* 252* 253*   Lipid Profile: No results for input(s): CHOL, HDL, LDLCALC, TRIG, CHOLHDL, LDLDIRECT in the last 72 hours. Thyroid Function Tests: No results for input(s): TSH, T4TOTAL, FREET4, T3FREE, THYROIDAB in the last 72 hours. Anemia Panel: No results for input(s): VITAMINB12, FOLATE, FERRITIN, TIBC, IRON, RETICCTPCT in the last 72 hours. Sepsis Labs: Recent Labs  Lab 10/29/20 1418 10/29/20 1744  PROCALCITON <0.10  --   LATICACIDVEN 4.1* 1.9    Recent Results (from the past 240 hour(s))  Blood culture (routine x 2)     Status: None (Preliminary result)   Collection Time: 10/29/20  2:25 PM   Specimen: BLOOD  Result Value Ref Range Status   Specimen Description BLOOD BLOOD LEFT HAND  Final   Special Requests   Final    BOTTLES DRAWN AEROBIC AND ANAEROBIC Blood Culture adequate  volume   Culture   Final    NO GROWTH 3 DAYS Performed at [facility], [address] [place], [place] [address]    Report Status PENDING  Incomplete  Resp Panel by RT-PCR (Flu  A&B, Covid) Nasopharyngeal Swab     Status: Abnormal   Collection Time: 10/29/20  2:26 PM   Specimen: Nasopharyngeal Swab; Nasopharyngeal(NP) swabs in vial transport medium  Result Value Ref Range Status   SARS Coronavirus 2 by RT PCR POSITIVE (A) NEGATIVE Final    Comment: RESULT CALLED TO, READ BACK BY AND VERIFIED WITH: ROBIN REGISTER AT 1559 ON 10/29/20 BY SS (NOTE) SARS-CoV-2 target nucleic acids are DETECTED.  The SARS-CoV-2 RNA is generally detectable in upper respiratory specimens during the acute phase of infection. Positive results are indicative of the presence of the identified virus, but do not rule out bacterial infection or co-infection with other pathogens not detected by the test. Clinical correlation with patient history and other diagnostic information is necessary to determine patient infection status. The expected result is Negative.  Fact Sheet for Patients: EntrepreneurPulse.com.au  Fact Sheet for Healthcare Providers: IncredibleEmployment.be  This test is not yet approved or cleared by the [place] FDA and  has been authorized for detection and/or diagnosis of SARS-CoV-2 by FDA under an Emergency Use Authorization (EUA).  This EUA will remain in effect (meaning this test  can be used) for the duration of  the COVID-19 declaration under Section 564(b)(1) of the Act, 21 U.S.C. section 360bbb-3(b)(1), unless the authorization is terminated or revoked sooner.     Influenza A by PCR NEGATIVE NEGATIVE Final   Influenza B by PCR NEGATIVE NEGATIVE Final    Comment: (NOTE) The Xpert Xpress SARS-CoV-2/FLU/RSV plus assay is intended as an aid in the diagnosis of influenza from Nasopharyngeal swab specimens and should not be used as a  sole basis for treatment. Nasal washings and aspirates are unacceptable for Xpert Xpress SARS-CoV-2/FLU/RSV testing.  Fact Sheet for Patients: EntrepreneurPulse.com.au  Fact Sheet for Healthcare Providers: IncredibleEmployment.be  This test is not yet approved or cleared by the [place] FDA and has been authorized for detection and/or diagnosis of SARS-CoV-2 by FDA under an Emergency Use Authorization (EUA). This EUA will remain in effect (meaning this test can be used) for the duration of the COVID-19 declaration under Section 564(b)(1) of the Act, 21 U.S.C. section 360bbb-3(b)(1), unless the authorization is terminated or revoked.  Performed at [facility], [address] [place], [place] [address]   Blood culture (routine x 2)     Status: None (Preliminary result)   Collection Time: 10/29/20  2:28 PM   Specimen: BLOOD  Result Value Ref Range Status   Specimen Description BLOOD BLOOD RIGHT HAND  Final   Special Requests   Final    BOTTLES DRAWN AEROBIC AND ANAEROBIC Blood Culture adequate volume   Culture   Final    NO GROWTH 3 DAYS Performed at [facility], [address], [place], [place] [address]    Report Status PENDING  Incomplete      Imaging Studies   No results found.   Medications   Scheduled Meds:  apixaban  5 mg Oral BID   atorvastatin  10 mg Oral QHS   dextromethorphan-guaiFENesin  1 tablet Oral BID   diclofenac Sodium  2 g Topical QID   fluticasone furoate-vilanterol  1 puff Inhalation Daily   And   umeclidinium bromide  1 puff Inhalation Daily   insulin aspart  0-15 Units Subcutaneous TID WC   insulin aspart  0-5 Units Subcutaneous QHS   insulin aspart  10 Units Subcutaneous TID WC   insulin glargine-yfgn  45 Units Subcutaneous QHS   montelukast  10 mg  Oral QHS   pantoprazole  80 mg Oral Q1200   predniSONE  40 mg Oral Q breakfast   traZODone  50 mg Oral QHS   Continuous  Infusions:  sodium chloride     remdesivir 100 mg in NS 100 mL Stopped (11/01/20 0954)       LOS: 2 days    Time spent: 25 minutes with > 50% spent at bedside and in coordination of care     Ezekiel Slocumb, DO [facility]  11/01/2020, 5:08 PM      If 7PM-7AM, please contact night-coverage. How to contact the [facility] Attending or Consulting provider [place] or covering provider during after hours [place], for this patient?    Check the care team in [facility] and look for a) attending/consulting [facility] provider listed and b) the [facility] team listed Log into [email or web address] and use [place]'s universal password to access. If you do not have the password, please contact the hospital operator. Locate the [facility] provider you are looking for under [facility] and page to a number that you can be directly reached. If you still have difficulty reaching the provider, please page the [facility] (Director on Call) for the Hospitalists listed on amion for assistance.

## 2020-11-01 NOTE — Progress Notes (Signed)
Dr Arbutus Ped made aware that tele monitoring reported a 5 beat run of vtach earlier, acknowledged, no new orders

## 2020-11-01 NOTE — Evaluation (Signed)
Occupational Therapy Evaluation Patient Details Name: Karen [name] MRN: [id number] DOB: [birth year] Today's Date: 11/01/2020    History of Present Illness 75 y.o. female with medical history significant for COPD, obesity, history of tobacco use, CAD, insulin-dependent diabetes mellitus, GERD, hyperlipidemia, hypertension, presents the emergency department for chief concerns of syncope.Pt now covid positive.   Clinical Impression   Patient presenting with decreased Ind in self care, balance, endurance,safety awareness, and strength. Patient reports living at home with husband and son PTA. She reports use of rollator for mobility and is I in self care tasks. Her husband assists with IADLs and drives her to appointments. Patient currently functioning at min A but fatigues quickly. She needs maximal encouragement to exit bed and participate. Pt needing assistance to don B socks on EOB. She stands and takes several steps with min A and unsteady. Pt has hx of falls at home and hospital.  Patient will benefit from acute OT to increase overall independence in the areas of ADLs, functional mobility,and safety awareness  in order to safely discharge to next venue of care.     Follow Up Recommendations  SNF;Supervision/Assistance - 24 hour    Equipment Recommendations  3 in 1 bedside commode       Precautions / Restrictions Precautions Precautions: Fall Restrictions Weight Bearing Restrictions: No      Mobility Bed Mobility Overal bed mobility: Needs Assistance Bed Mobility: Supine to Sit;Sit to Supine     Supine to sit: Min assist Sit to supine: Min assist   General bed mobility comments: min A for trunk support to EOB. Min cuing for technique and hand placement.    Transfers Overall transfer level: Needs assistance   Transfers: Sit to/from Stand;Stand Pivot Transfers Sit to Stand: Min assist Stand pivot transfers: Min assist            Balance Overall balance assessment:  Needs assistance Sitting-balance support: Feet supported;Bilateral upper extremity supported Sitting balance-Leahy Scale: Good     Standing balance support: During functional activity Standing balance-Leahy Scale: Fair Standing balance comment: reliance on B UE support                           ADL either performed or assessed with clinical judgement   ADL Overall ADL's : Needs assistance/impaired                     Lower Body Dressing: Minimal assistance   Toilet Transfer: BSC;Minimal assistance Toilet Transfer Details (indicate cue type and reason): simulated transfer         Functional mobility during ADLs: Minimal assistance General ADL Comments: Pt needing increased encouragement for participation this session. Fatigues quickly.     Vision Baseline Vision/History: 1 Wears glasses Patient Visual Report: No change from baseline              Pertinent Vitals/Pain Pain Assessment: Faces Faces Pain Scale: Hurts little more Pain Location: low back Pain Descriptors / Indicators: Aching;Discomfort Pain Intervention(s): Monitored during session;Repositioned     Hand Dominance Right   Extremity/Trunk Assessment Upper Extremity Assessment Upper Extremity Assessment: Overall WFL for tasks assessed;Generalized weakness   Lower Extremity Assessment Lower Extremity Assessment: Overall WFL for tasks assessed;Generalized weakness       Communication Communication Communication: No difficulties   Cognition Arousal/Alertness: Awake/alert Behavior During Therapy: WFL for tasks assessed/performed Overall Cognitive Status: Within Functional Limits for tasks assessed  Home Living Family/patient expects to be discharged to:: Private residence Living Arrangements: Spouse/significant other;Children Available Help at Discharge: Family;Available 24 hours/day Type of Home: Mobile home Home  Access: Ramped entrance     Home Layout: One level     Bathroom Shower/Tub: Teacher, early years/pre: Handicapped height     Home Equipment: Environmental consultant - 2 wheels;Grab bars - tub/shower (rollator)          Prior Functioning/Environment Level of Independence: Independent with assistive device(s)        Comments: Pt reports being mod I with use of rollator. She does not drive and husband assists with IADLs and taking her to appointments. She reports being independent with self care tasks at baseline.        OT Problem List: Decreased strength;Decreased range of motion;Decreased activity tolerance;Impaired balance (sitting and/or standing);Decreased safety awareness;Pain      OT Treatment/Interventions: Self-care/ADL training;Therapeutic exercise;Therapeutic activities;Neuromuscular education;Energy conservation;DME and/or AE instruction;Balance training;Patient/family education;Manual therapy    OT Goals(Current goals can be found in the care plan section) Acute Rehab OT Goals Patient Stated Goal: "to rest because I don't feel good" OT Goal Formulation: With patient Time For Goal Achievement: 11/15/20 Potential to Achieve Goals: Fair ADL Goals Pt Will Perform Grooming: with modified independence;standing Pt Will Perform Lower Body Dressing: with supervision;sit to/from stand Pt Will Transfer to Toilet: with supervision;bedside commode Pt Will Perform Toileting - Clothing Manipulation and hygiene: with supervision;sit to/from stand  OT Frequency: Min 2X/week   Barriers to D/C:    none known at this time          AM-PAC OT "6 Clicks" Daily Activity     Outcome Measure Help from another person eating meals?: None Help from another person taking care of personal grooming?: A Little Help from another person toileting, which includes using toliet, bedpan, or urinal?: A Little Help from another person bathing (including washing, rinsing, drying)?: A Little Help from  another person to put on and taking off regular upper body clothing?: None Help from another person to put on and taking off regular lower body clothing?: A Little 6 Click Score: 20   End of Session Nurse Communication: Mobility status  Activity Tolerance: Patient limited by fatigue Patient left: in bed;with call bell/phone within reach;with bed alarm set  OT Visit Diagnosis: Unsteadiness on feet (R26.81);Repeated falls (R29.6);Muscle weakness (generalized) (M62.81);History of falling (Z91.81)                Time: 1000-1026 OT Time Calculation (min): 26 min Charges:  OT General Charges $OT Visit: 1 Visit OT Evaluation $OT Eval Moderate Complexity: 1 Mod OT Treatments $Self Care/Home Management : 8-22 mins  Darleen Crocker, MS, OTR/L , CBIS ascom [phone]  11/01/20, 1:20 PM

## 2020-11-02 ENCOUNTER — Inpatient Hospital Stay: Payer: Medicare Other

## 2020-11-02 LAB — GLUCOSE, CAPILLARY
Glucose-Capillary: 149 mg/dL — ABNORMAL HIGH (ref 70–99)
Glucose-Capillary: 239 mg/dL — ABNORMAL HIGH (ref 70–99)
Glucose-Capillary: 258 mg/dL — ABNORMAL HIGH (ref 70–99)
Glucose-Capillary: 224 mg/dL — ABNORMAL HIGH (ref 70–99)
Glucose-Capillary: 247 mg/dL — ABNORMAL HIGH (ref 70–99)

## 2020-11-02 LAB — COMPREHENSIVE METABOLIC PANEL
ALT: 22 U/L (ref 0–44)
AST: 16 U/L (ref 15–41)
Albumin: 3.2 g/dL — ABNORMAL LOW (ref 3.5–5.0)
Alkaline Phosphatase: 69 U/L (ref 38–126)
Anion gap: 9 (ref 5–15)
BUN: 36 mg/dL — ABNORMAL HIGH (ref 8–23)
CO2: 32 mmol/L (ref 22–32)
Calcium: 9 mg/dL (ref 8.9–10.3)
Chloride: 95 mmol/L — ABNORMAL LOW (ref 98–111)
Creatinine, Ser: 1 mg/dL (ref 0.44–1.00)
GFR, Estimated: 58 mL/min — ABNORMAL LOW (ref >60)
Glucose, Bld: 132 mg/dL — ABNORMAL HIGH (ref 70–99)
Potassium: 4.2 mmol/L (ref 3.5–5.1)
Sodium: 136 mmol/L (ref 135–145)
Total Bilirubin: 0.7 mg/dL (ref 0.3–1.2)
Total Protein: 6.3 g/dL — ABNORMAL LOW (ref 6.5–8.1)

## 2020-11-02 LAB — CBC WITH DIFFERENTIAL/PLATELET
Abs Immature Granulocytes: 0.04 10*3/uL (ref 0.00–0.07)
Basophils Absolute: 0 10*3/uL (ref 0.0–0.1)
Basophils Relative: 0 %
Eosinophils Absolute: 0.1 10*3/uL (ref 0.0–0.5)
Eosinophils Relative: 1 %
HCT: 31 % — ABNORMAL LOW (ref 36.0–46.0)
Hemoglobin: 9.6 g/dL — ABNORMAL LOW (ref 12.0–15.0)
Immature Granulocytes: 0 %
Lymphocytes Relative: 19 %
Lymphs Abs: 1.9 10*3/uL (ref 0.7–4.0)
MCH: 25.9 pg — ABNORMAL LOW (ref 26.0–34.0)
MCHC: 31 g/dL (ref 30.0–36.0)
MCV: 83.6 fL (ref 80.0–100.0)
Monocytes Absolute: 0.8 10*3/uL (ref 0.1–1.0)
Monocytes Relative: 8 %
Neutro Abs: 7 10*3/uL (ref 1.7–7.7)
Neutrophils Relative %: 72 %
Platelets: 259 10*3/uL (ref 150–400)
RBC: 3.71 MIL/uL — ABNORMAL LOW (ref 3.87–5.11)
RDW: 14.9 % (ref 11.5–15.5)
WBC: 9.8 10*3/uL (ref 4.0–10.5)
nRBC: 0 % (ref 0.0–0.2)

## 2020-11-02 LAB — D-DIMER, QUANTITATIVE: D-Dimer, Quant: 1.11 ug/mL-FEU — ABNORMAL HIGH (ref 0.00–0.50)

## 2020-11-02 LAB — C-REACTIVE PROTEIN: CRP: 1.3 mg/dL — ABNORMAL HIGH (ref <1.0)

## 2020-11-02 MED ORDER — ACETAMINOPHEN 325 MG PO TABS
650.0000 mg | ORAL_TABLET | Freq: Four times a day (QID) | ORAL | Status: DC | PRN
  Administered 2020-11-02 – 2020-11-04 (×3): 650 mg via ORAL
  Filled 2020-11-02 (×4): qty 2

## 2020-11-02 MED ORDER — FUROSEMIDE 40 MG PO TABS
40.0000 mg | ORAL_TABLET | Freq: Every day | ORAL | Status: DC
  Administered 2020-11-02: 40 mg via ORAL
  Filled 2020-11-02: qty 1

## 2020-11-02 MED ORDER — HYDROCHLOROTHIAZIDE 25 MG PO TABS
25.0000 mg | ORAL_TABLET | Freq: Every day | ORAL | Status: DC
  Administered 2020-11-03: 25 mg via ORAL
  Filled 2020-11-02: qty 1

## 2020-11-02 NOTE — Progress Notes (Signed)
PROGRESS NOTE    Karen [name]   [id number]  DOB: Jul 23, 1945  PCP: Earlie Counts, FNP    DOA: 10/29/2020 LOS: 3    Brief Narrative / Hospital Course to Date:   75 y.o. female with medical history significant for COPD on 3 L/min home O2, obesity, history of tobacco use, CAD, insulin-dependent diabetes mellitus, GERD, hyperlipidemia, hypertension, presented to the ED for evaluation syncope.  She reported recent myalgias, mild cough and generalized weakness.  Pt found Covid-19+ with imaging suspicious for possible pneumonia.  Admitted to [facility] service and being treated with remdesivir and IV steroids.  Assessment & Plan   Principal Problem:   Pneumonia due to COVID-19 virus Active Problems:   Weakness   Leucocytosis   COPD (chronic obstructive pulmonary disease) (HCC)   Hypertension   AKI (acute kidney injury) ([place])   Type 2 diabetes mellitus with hyperlipidemia (HCC)   CKD (chronic kidney disease), stage IIIa   (HFpEF) heart failure with preserved ejection fraction (HCC)   Morbid obesity (HCC)   AF (paroxysmal atrial fibrillation) (HCC)   Pulmonary nodules/lesions, multiple   Syncope likely due to hypoxia in the setting of COVID-19 infection and right pleural effusion with history of advanced COPD and acute on chronic respiratory failure with hypoxia -  8/29 - notified by RN that pt's spO2 dropped to 80% on 3 L/min while sitting edge of bed with therapy. Continue remdesivir  Initially given IV Solu-Medrol  Continue Prednisone Procalcitonin negative so antibiotics deferred, monitor. Supportive care with bronchodilators, COPD regimen with inhalers substituted for nebs. Pulmonary hygiene with I-S and flutter valve Monitor inflammatory markers, CMP, CBC and D-dimer. Maintain O2 sat greater than 88%    AKI superimposed on CKD stage IIIa -POA, resolved. Likely prerenal azotemia.  Presented with creatinine 1.54.  Baseline creatinine appears 0.86-1.25.   Held home Lasix,  HCTZ and losartan on admission. Encourage p.o. hydration and monitor renal function. Was given Lasix in the ED, hold IV fluids in setting of COVID infection and baseline on diuretics. Monitor BMP. Avoid nephrotoxins and hypotension. Resume held meds as below.  Right pleural effusion with right lower lobe atelectasis versus underlying infection -respiratory status is stable on her baseline supplemental oxygen.  This is likely chronic.   Given 40 IV Lasix once on admission. Will monitor with serial imaging If respiratory status worsening and consider thoracentesis. Repeat CXR 8/30 with vascular congestion ? Mild pulmonary edema and persistent right pleural effusion with basilar atelectasis. Resumed on her diuretics.  History of hypertension - Home HCTZ, Lasix and losartan held on admission due to AKI.   8/28 - resume Lasix and HCTZ, continue hold losartan Oral hydralazine as needed.   Hyperlipidemia -continue atorvastatin  Steroid-induced hyperglycemia -sugars in the 300s due to IV steroids.  Adding scheduled mealtime NovoLog and increase sliding scale. -- Monitor CBGs  -- Titrate insulin for inpatient goal 140-180 8/29: increase scheduled novolog to 12 units tid wc  Insulin-dependent type 2 diabetes -continue on basal insulin with sliding scale NovoLog.  Patient also on dulaglutide every Wednesday and metformin which are held.  Paroxysmal A. Fib -continue Eliquis.  Appears not on any rate control agents.  Heart rate controlled. Monitor on telemetry.  Recurrent falls / Generalized Weakness / Ambulatory dysfunction - PT and OT evaluations.  SNF recommended.  TOC following.  Fall precautions.  Pt declines SNF and HH.  Her husband helps her at home.   Hand pain, bilateral - due to fall.  ROM intact  in b/l hands and fingers, very unlikely any fracture.  Topical voltaren gel to painful areas on dorsal hands.  Will xray if worsening or persistent.  Stable, improving.  Forehead hematoma and  bilateral periorbital ecchymosis - due to her fall on day 1 when pt got up without assistance to try get to bathroom.  CT head negative.  No complaint of affected vision or eye pain.  Monitor.  Multiple pulmonary nodules -seen on noncontrast chest CT.  Largest is 5 mm.  Outpatient follow-up and consideration for repeat CT chest in 12 months.  Mediastinal lymphadenopathy -likely due to acute COVID-19 infection.  Consider repeat imaging and follow-up once infection is cleared and further evaluation if indicated at that time.  GERD -on PPI  Obesity: Body mass index is 39.83 kg/m.  Complicates overall care and prognosis.  Recommend lifestyle modifications including physical activity and diet for weight loss and overall long-term health.   DVT prophylaxis: Place TED hose Start: 10/29/20 1650 apixaban (ELIQUIS) tablet 5 mg   Diet:  Diet Orders (From admission, onward)     Start     Ordered   10/29/20 1650  Diet heart healthy/carb modified Room service appropriate? Yes; Fluid consistency: Thin  Diet effective now       Question Answer Comment  Diet-HS Snack? Nothing   Room service appropriate? Yes   Fluid consistency: Thin      10/29/20 1650              Code Status: Full Code   Subjective 11/02/20    Patient sitting up edge of bed when seen today.  Reports having a headache (due to bruise from her fall), but it's no worse, getting better.  Ongoing cough.  Husband on speaker phone - he is home sick with Covid but doing well enough did not require any treatment.   Disposition Plan & Communication   Status is: Inpatient  Remains inpatient appropriate because:IV treatments appropriate due to intensity of illness or inability to take PO  Dispo: The patient is from: Home              Anticipated d/c is to: Home              Patient currently is not medically stable to d/c.   Difficult to place patient No   Family Communication: Husband on speaker phone in patient's room during  encounter 8/27, 8/30.   Consults, Procedures, Significant Events   Consultants:  None  Procedures:  None  Antimicrobials:  Anti-infectives (From admission, onward)    Start     Dose/Rate Route Frequency Ordered Stop   10/30/20 1000  remdesivir 100 mg in sodium chloride 0.9 % 100 mL IVPB       See Hyperspace for full Linked Orders Report.   100 mg 200 mL/hr over 30 Minutes Intravenous Daily 10/29/20 1650 11/02/20 0933   10/29/20 1800  remdesivir 200 mg in sodium chloride 0.9% 250 mL IVPB       See Hyperspace for full Linked Orders Report.   200 mg 580 mL/hr over 30 Minutes Intravenous Once 10/29/20 1650 10/29/20 1906         Micro    Objective   Vitals:   11/01/20 0928 11/01/20 2212 11/02/20 0741 11/02/20 1225  BP:  (!) 149/67 138/68 (!) 148/83  Pulse:  79 81 86  Resp: '20 18 17 20  '$ Temp:  98.2 F (36.8 C) (!) 97.4 F (36.3 C) 97.9 F (36.6 C)  TempSrc:  Oral Oral Oral  SpO2:  91% 92% 92%  Weight:      Height:        Intake/Output Summary (Last 24 hours) at 11/02/2020 1420 Last data filed at 11/02/2020 0743 Gross per 24 hour  Intake --  Output 1400 ml  Net -1400 ml   Filed Weights   10/29/20 1234  Weight: 102 kg    Physical Exam:  General exam: sitting edge of bed, awake, alert, no acute distress, obese HEENT: Left forehead and periorbital ecchymosis darker/evolving as expected, hearing grossly normal  Respiratory system: still with poor aeration, expiratory wheezing, normal respiratory effort at rest, on 3 L/min nasal cannula oxygen. Cardiovascular system: normal S1/S2, RRR, trace BLE edema   Gastrointestinal system: soft, non-tender abdomen Central nervous system: A&O x4. no gross focal neurologic deficits, normal speech   Labs   Data Reviewed: I have personally reviewed following labs and imaging studies  CBC: Recent Labs  Lab 10/29/20 1237 10/30/20 0416 10/31/20 0638 11/01/20 0521 11/02/20 0426  WBC 11.9* 8.3 10.4 9.3 9.8  NEUTROABS   --  6.0 9.4* 6.6 7.0  HGB 9.3* 9.0* 9.4* 8.8* 9.6*  HCT 30.4* 28.9* 30.1* 28.1* 31.0*  MCV 86.1 84.3 81.6 82.6 83.6  PLT 282 252 255 261 [id number]   Basic Metabolic Panel: Recent Labs  Lab 10/29/20 1237 10/30/20 0416 10/31/20 0638 11/01/20 0521 11/02/20 0426  NA 132* 135 133* 134* 136  K 4.0 4.0 4.6 3.7 4.2  CL 85* 91* 88* 92* 95*  CO2 31 38* 34* 35* 32  GLUCOSE 397* 224* 325* 127* 132*  BUN 36* 35* 37* 39* 36*  CREATININE 1.54* 1.19* 0.94 0.92 1.00  CALCIUM 8.6* 8.7* 9.5 9.0 9.0  MG  --   --  2.0  --   --    GFR: Estimated Creatinine Clearance: 54.6 mL/min (by C-G formula based on SCr of 1 mg/dL). Liver Function Tests: Recent Labs  Lab 10/30/20 0416 10/31/20 0638 11/01/20 0521 11/02/20 0426  AST 16 39 21 16  ALT '17 29 23 22  '$ ALKPHOS 64 71 68 69  BILITOT 0.7 0.8 0.7 0.7  PROT 6.8 7.2 6.2* 6.3*  ALBUMIN 3.4* 3.5 3.1* 3.2*   No results for input(s): LIPASE, AMYLASE in the last 168 hours. No results for input(s): AMMONIA in the last 168 hours. Coagulation Profile: No results for input(s): INR, PROTIME in the last 168 hours. Cardiac Enzymes: No results for input(s): CKTOTAL, CKMB, CKMBINDEX, TROPONINI in the last 168 hours. BNP (last 3 results) No results for input(s): PROBNP in the last 8760 hours. HbA1C: No results for input(s): HGBA1C in the last 72 hours.  CBG: Recent Labs  Lab 11/01/20 1551 11/01/20 2210 11/02/20 0743 11/02/20 1220 11/02/20 1222  GLUCAP 253* 199* 149* 258* 239*   Lipid Profile: No results for input(s): CHOL, HDL, LDLCALC, TRIG, CHOLHDL, LDLDIRECT in the last 72 hours. Thyroid Function Tests: No results for input(s): TSH, T4TOTAL, FREET4, T3FREE, THYROIDAB in the last 72 hours. Anemia Panel: No results for input(s): VITAMINB12, FOLATE, FERRITIN, TIBC, IRON, RETICCTPCT in the last 72 hours. Sepsis Labs: Recent Labs  Lab 10/29/20 1418 10/29/20 1744  PROCALCITON <0.10  --   LATICACIDVEN 4.1* 1.9    Recent Results (from the past 240  hour(s))  Blood culture (routine x 2)     Status: None (Preliminary result)   Collection Time: 10/29/20  2:25 PM   Specimen: BLOOD  Result Value Ref Range Status   Specimen Description BLOOD BLOOD LEFT HAND  Final   Special Requests   Final    BOTTLES DRAWN AEROBIC AND ANAEROBIC Blood Culture adequate volume   Culture   Final    NO GROWTH 4 DAYS Performed at [facility], [address] [place], [place] [address]    Report Status PENDING  Incomplete  Resp Panel by RT-PCR (Flu A&B, Covid) Nasopharyngeal Swab     Status: Abnormal   Collection Time: 10/29/20  2:26 PM   Specimen: Nasopharyngeal Swab; Nasopharyngeal(NP) swabs in vial transport medium  Result Value Ref Range Status   SARS Coronavirus 2 by RT PCR POSITIVE (A) NEGATIVE Final    Comment: RESULT CALLED TO, READ BACK BY AND VERIFIED WITH: ROBIN REGISTER AT 1559 ON 10/29/20 BY SS (NOTE) SARS-CoV-2 target nucleic acids are DETECTED.  The SARS-CoV-2 RNA is generally detectable in upper respiratory specimens during the acute phase of infection. Positive results are indicative of the presence of the identified virus, but do not rule out bacterial infection or co-infection with other pathogens not detected by the test. Clinical correlation with patient history and other diagnostic information is necessary to determine patient infection status. The expected result is Negative.  Fact Sheet for Patients: EntrepreneurPulse.com.au  Fact Sheet for Healthcare Providers: IncredibleEmployment.be  This test is not yet approved or cleared by the [place] FDA and  has been authorized for detection and/or diagnosis of SARS-CoV-2 by FDA under an Emergency Use Authorization (EUA).  This EUA will remain in effect (meaning this test  can be used) for the duration of  the COVID-19 declaration under Section 564(b)(1) of the Act, 21 U.S.C. section 360bbb-3(b)(1), unless the authorization  is terminated or revoked sooner.     Influenza A by PCR NEGATIVE NEGATIVE Final   Influenza B by PCR NEGATIVE NEGATIVE Final    Comment: (NOTE) The Xpert Xpress SARS-CoV-2/FLU/RSV plus assay is intended as an aid in the diagnosis of influenza from Nasopharyngeal swab specimens and should not be used as a sole basis for treatment. Nasal washings and aspirates are unacceptable for Xpert Xpress SARS-CoV-2/FLU/RSV testing.  Fact Sheet for Patients: EntrepreneurPulse.com.au  Fact Sheet for Healthcare Providers: IncredibleEmployment.be  This test is not yet approved or cleared by the [place] FDA and has been authorized for detection and/or diagnosis of SARS-CoV-2 by FDA under an Emergency Use Authorization (EUA). This EUA will remain in effect (meaning this test can be used) for the duration of the COVID-19 declaration under Section 564(b)(1) of the Act, 21 U.S.C. section 360bbb-3(b)(1), unless the authorization is terminated or revoked.  Performed at [facility], [address] [place], [place] [address]   Blood culture (routine x 2)     Status: None (Preliminary result)   Collection Time: 10/29/20  2:28 PM   Specimen: BLOOD  Result Value Ref Range Status   Specimen Description BLOOD BLOOD RIGHT HAND  Final   Special Requests   Final    BOTTLES DRAWN AEROBIC AND ANAEROBIC Blood Culture adequate volume   Culture   Final    NO GROWTH 4 DAYS Performed at [facility], [address] [place], [place] [address]    Report Status PENDING  Incomplete      Imaging Studies   DG Chest Port 1 View  Result Date: 11/02/2020 CLINICAL DATA:  Pleural effusion EXAM: PORTABLE CHEST 1 VIEW COMPARISON:  Portable exam 0801 hours compared to 10/29/2020 FINDINGS: Enlargement of cardiac silhouette with pulmonary vascular congestion. Atherosclerotic calcification aorta. Persistent RIGHT pleural effusion and basilar atelectasis.  Accentuation of interstitial markings in the perihilar regions question minimal edema. No pneumothorax or acute osseous findings. IMPRESSION: Enlargement of cardiac silhouette with pulmonary vascular congestion and question minimal pulmonary edema. Persistent RIGHT pleural effusion and basilar atelectasis. Aortic Atherosclerosis (ICD10-I70.0). Electronically Signed   By: Lavonia Dana M.D.   On: 11/02/2020 10:38     Medications   Scheduled Meds:  apixaban  5 mg Oral BID   atorvastatin  10 mg Oral QHS   dextromethorphan-guaiFENesin  1 tablet Oral BID   diclofenac Sodium  2 g Topical QID   fluticasone furoate-vilanterol  1 puff Inhalation Daily   And   umeclidinium bromide  1 puff Inhalation Daily   furosemide  40 mg Oral Daily   [START ON 11/03/2020] hydrochlorothiazide  25 mg Oral Daily   insulin aspart  0-15 Units Subcutaneous TID WC   insulin aspart  0-5 Units Subcutaneous QHS   insulin aspart  12 Units Subcutaneous TID WC   insulin glargine-yfgn  45 Units Subcutaneous QHS   montelukast  10 mg Oral QHS   pantoprazole  80 mg Oral Q1200   predniSONE  40 mg Oral Q breakfast   traZODone  50 mg Oral QHS   Continuous Infusions:  sodium chloride         LOS: 3 days    Time spent: 25 minutes with > 50% spent at bedside and in coordination of care     Ezekiel Slocumb, DO [facility]  11/02/2020, 2:20 PM      If 7PM-7AM, please contact night-coverage. How to contact the [facility] Attending or Consulting provider [place] or covering provider during after hours [facility], for this patient?    Check the care team in [facility] and look for a) attending/consulting [facility] provider listed and b) the [facility] team listed Log into [email or web address] and use [place]'s universal password to access. If you do not have the password, please contact the hospital operator. Locate the [facility] provider you are looking for under [facility] and page to a number that you can be directly reached. If you still  have difficulty reaching the provider, please page the [facility] (Director on Call) for the Hospitalists listed on amion for assistance.

## 2020-11-02 NOTE — Plan of Care (Signed)
  Problem: Education: Goal: Knowledge of General Education information will improve Description: Including pain rating scale, medication(s)/side effects and non-pharmacologic comfort measures Outcome: Progressing   Problem: Clinical Measurements: Goal: Will remain free from infection Outcome: Progressing Goal: Respiratory complications will improve Outcome: Progressing Goal: Cardiovascular complication will be avoided Outcome: Progressing   Problem: Activity: Goal: Risk for activity intolerance will decrease Outcome: Progressing   Problem: Nutrition: Goal: Adequate nutrition will be maintained Outcome: Progressing   Problem: Pain Managment: Goal: General experience of comfort will improve Outcome: Progressing

## 2020-11-02 NOTE — Progress Notes (Signed)
O2 sat 88% on 3L while lying in bed at rest.  With activity OOB to chair sat dropped to 83% om 3L and recovered after use of incentive spirometer and deep breathing exercises to 92%. Up in chair eating dinner at this time.

## 2020-11-03 LAB — CBC WITH DIFFERENTIAL/PLATELET
Abs Immature Granulocytes: 0.06 10*3/uL (ref 0.00–0.07)
Basophils Absolute: 0 10*3/uL (ref 0.0–0.1)
Basophils Relative: 0 %
Eosinophils Absolute: 0.1 10*3/uL (ref 0.0–0.5)
Eosinophils Relative: 1 %
HCT: 30.3 % — ABNORMAL LOW (ref 36.0–46.0)
Hemoglobin: 9.4 g/dL — ABNORMAL LOW (ref 12.0–15.0)
Immature Granulocytes: 1 %
Lymphocytes Relative: 17 %
Lymphs Abs: 1.8 10*3/uL (ref 0.7–4.0)
MCH: 25.5 pg — ABNORMAL LOW (ref 26.0–34.0)
MCHC: 31 g/dL (ref 30.0–36.0)
MCV: 82.3 fL (ref 80.0–100.0)
Monocytes Absolute: 0.8 10*3/uL (ref 0.1–1.0)
Monocytes Relative: 8 %
Neutro Abs: 7.5 10*3/uL (ref 1.7–7.7)
Neutrophils Relative %: 73 %
Platelets: 278 10*3/uL (ref 150–400)
RBC: 3.68 MIL/uL — ABNORMAL LOW (ref 3.87–5.11)
RDW: 14.9 % (ref 11.5–15.5)
WBC: 10.3 10*3/uL (ref 4.0–10.5)
nRBC: 0 % (ref 0.0–0.2)

## 2020-11-03 LAB — COMPREHENSIVE METABOLIC PANEL
ALT: 19 U/L (ref 0–44)
AST: 12 U/L — ABNORMAL LOW (ref 15–41)
Albumin: 3.3 g/dL — ABNORMAL LOW (ref 3.5–5.0)
Alkaline Phosphatase: 67 U/L (ref 38–126)
Anion gap: 7 (ref 5–15)
BUN: 37 mg/dL — ABNORMAL HIGH (ref 8–23)
CO2: 35 mmol/L — ABNORMAL HIGH (ref 22–32)
Calcium: 8.8 mg/dL — ABNORMAL LOW (ref 8.9–10.3)
Chloride: 94 mmol/L — ABNORMAL LOW (ref 98–111)
Creatinine, Ser: 1.02 mg/dL — ABNORMAL HIGH (ref 0.44–1.00)
GFR, Estimated: 57 mL/min — ABNORMAL LOW (ref >60)
Glucose, Bld: 154 mg/dL — ABNORMAL HIGH (ref 70–99)
Potassium: 3.8 mmol/L (ref 3.5–5.1)
Sodium: 136 mmol/L (ref 135–145)
Total Bilirubin: 0.8 mg/dL (ref 0.3–1.2)
Total Protein: 6.5 g/dL (ref 6.5–8.1)

## 2020-11-03 LAB — CULTURE, BLOOD (ROUTINE X 2)
Culture: NO GROWTH
Culture: NO GROWTH
Special Requests: ADEQUATE
Special Requests: ADEQUATE

## 2020-11-03 LAB — GLUCOSE, CAPILLARY
Glucose-Capillary: 149 mg/dL — ABNORMAL HIGH (ref 70–99)
Glucose-Capillary: 207 mg/dL — ABNORMAL HIGH (ref 70–99)
Glucose-Capillary: 263 mg/dL — ABNORMAL HIGH (ref 70–99)
Glucose-Capillary: 312 mg/dL — ABNORMAL HIGH (ref 70–99)

## 2020-11-03 LAB — MAGNESIUM: Magnesium: 1.9 mg/dL (ref 1.7–2.4)

## 2020-11-03 LAB — D-DIMER, QUANTITATIVE: D-Dimer, Quant: 0.93 ug/mL-FEU — ABNORMAL HIGH (ref 0.00–0.50)

## 2020-11-03 LAB — C-REACTIVE PROTEIN: CRP: 0.9 mg/dL (ref <1.0)

## 2020-11-03 MED ORDER — METHYLPREDNISOLONE SODIUM SUCC 40 MG IJ SOLR
40.0000 mg | Freq: Two times a day (BID) | INTRAMUSCULAR | Status: AC
  Administered 2020-11-03 – 2020-11-05 (×5): 40 mg via INTRAVENOUS
  Filled 2020-11-03 (×5): qty 1

## 2020-11-03 MED ORDER — INSULIN GLARGINE-YFGN 100 UNIT/ML ~~LOC~~ SOLN
48.0000 [IU] | Freq: Every day | SUBCUTANEOUS | Status: DC
  Administered 2020-11-03: 48 [IU] via SUBCUTANEOUS
  Filled 2020-11-03 (×2): qty 0.48

## 2020-11-03 MED ORDER — FUROSEMIDE 10 MG/ML IJ SOLN
40.0000 mg | Freq: Once | INTRAMUSCULAR | Status: AC
  Administered 2020-11-03: 40 mg via INTRAVENOUS
  Filled 2020-11-03: qty 4

## 2020-11-03 MED ORDER — HYDROCOD POLST-CPM POLST ER 10-8 MG/5ML PO SUER
5.0000 mL | Freq: Two times a day (BID) | ORAL | Status: DC | PRN
  Administered 2020-11-03: 5 mL via ORAL
  Filled 2020-11-03: qty 5

## 2020-11-03 MED ORDER — LINAGLIPTIN 5 MG PO TABS
5.0000 mg | ORAL_TABLET | Freq: Every day | ORAL | Status: DC
  Administered 2020-11-03 – 2020-11-09 (×7): 5 mg via ORAL
  Filled 2020-11-03 (×7): qty 1

## 2020-11-03 MED ORDER — FUROSEMIDE 10 MG/ML IJ SOLN: 40.0000 mg | Freq: Two times a day (BID) | INTRAMUSCULAR | Status: DC

## 2020-11-03 MED ORDER — INSULIN ASPART 100 UNIT/ML IJ SOLN
0.0000 [IU] | Freq: Three times a day (TID) | INTRAMUSCULAR | Status: DC
  Administered 2020-11-03: 18:00:00 11 [IU] via SUBCUTANEOUS
  Administered 2020-11-03: 09:00:00 3 [IU] via SUBCUTANEOUS
  Administered 2020-11-03 – 2020-11-04 (×3): 7 [IU] via SUBCUTANEOUS
  Administered 2020-11-04: 20 [IU] via SUBCUTANEOUS
  Administered 2020-11-05: 09:00:00 7 [IU] via SUBCUTANEOUS
  Administered 2020-11-05: 12:00:00 4 [IU] via SUBCUTANEOUS
  Administered 2020-11-05: 3 [IU] via SUBCUTANEOUS
  Administered 2020-11-06 (×2): 4 [IU] via SUBCUTANEOUS
  Administered 2020-11-06: 18:00:00 3 [IU] via SUBCUTANEOUS
  Administered 2020-11-07: 18:00:00 4 [IU] via SUBCUTANEOUS
  Administered 2020-11-08 – 2020-11-09 (×3): 3 [IU] via SUBCUTANEOUS
  Filled 2020-11-03 (×15): qty 1

## 2020-11-03 NOTE — Plan of Care (Signed)
Pt educated on importance of mobility. Pt demonstrated increased mobilty sitting up for meals at bedside. Pt also demonstrated use of IS to decrease risk of increased breathing difficulty. Pt responds well to reinforcement/ encuragement.

## 2020-11-03 NOTE — TOC Progression Note (Signed)
Transition of Care [facility]) - Progression Note    Patient Details  Name: Karen [name] MRN: [id number] Date of Birth: [birth year]  Transition of Care [facility]) CM/SW [place], RN Phone Number: 11/03/2020, 3:53 PM  Clinical Narrative:   RNCM spoke with patient via phone.  Patient agreed to SNF as she states she does not feel well enough to go home.  She prefers [facility], RNCM explained that we cannot guarantee which facility can offer a bed.  Patient verbalized understanding.  TOC contact information given, TOC to follow to discharge.    Expected Discharge Plan: Home/Self Care Barriers to Discharge: Continued Medical Work up  Expected Discharge Plan and Services Expected Discharge Plan: Home/Self Care       Living arrangements for the past 2 months: Single Family Home                 DME Arranged: N/A DME Agency: NA       HH Arranged: NA, Refused [place] Agency: NA         Social Determinants of Health (SDOH) Interventions    Readmission Risk Interventions Readmission Risk Prevention Plan 10/30/2020 06/21/2020 09/30/2019  Transportation Screening Complete Complete Complete  PCP or Specialist Appt within 5-7 Days - - -  PCP or Specialist Appt within 3-5 Days - - Complete  Home Care Screening - - -  Medication Review (RN CM) - - -  Social Work Scientific laboratory technician for [place] Planning/Counseling - - Complete  Palliative Care Screening - - Not Applicable  Medication Review Press photographer) Complete Complete Complete  PCP or Specialist appointment within 3-5 days of discharge Complete Complete -  PCP/Specialist Appt Not Complete comments - - -  HRI or Home Care Consult Patient refused Complete -  SW Recovery Care/Counseling Consult Complete Complete -  Palliative Care Screening Not Applicable Not Applicable -  [place] Not Applicable Not Applicable -  Some recent data might be hidden

## 2020-11-03 NOTE — NC FL2 (Signed)
[place] LEVEL OF CARE SCREENING TOOL     IDENTIFICATION  Patient Name: Karen [name] Birthdate: [birth year] Sex: female Admission Date (Current Location): 10/29/2020  [facility] and [place] Number:  Engineering geologist and Address:  [facility], [address], [place], [place] [address]      Provider Number: [id number]  Attending Physician Name and Address:  Edwin Dada, *  Relative Name and Phone Number:  Ceceila, Mowad (Spouse)   [phone] (Mobile)    Current Level of Care: Hospital Recommended Level of Care:   Prior Approval Number:    Date Approved/Denied:   PASRR Number: [id number]  Discharge Plan: SNF    Current Diagnoses: Patient Active Problem List   Diagnosis Date Noted   Pneumonia due to COVID-19 virus 10/29/2020   Pulmonary nodules/lesions, multiple 10/29/2020   Aortic stenosis, moderate    AF (paroxysmal atrial fibrillation) (HCC)    Gastroesophageal reflux disease without esophagitis    Acute CHF (congestive heart failure) ([place]) 06/17/2020   Chronic respiratory failure with hypercapnia ([place]) 05/21/2020   Severe sepsis ([place]) 05/21/2020   UTI (urinary tract infection) 05/21/2020   Hyperglycemia due to type 2 diabetes mellitus ([place]) 05/21/2020   Abnormal CT of liver 05/21/2020   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 05/21/2020   Morbid obesity ([place]) 05/08/2020   Acute hip pain, left 10/23/2019   Lumbar stenosis with neurogenic claudication 10/23/2019   COPD with acute exacerbation ([place]) [id number]   Acute diastolic CHF (congestive heart failure) ([place]) 09/28/2019   (HFpEF) heart failure with preserved ejection fraction ([place]) 09/27/2019   Rectal bleeding 06/28/2019   Hypokalemia 06/28/2019   Hyponatremia 06/28/2019   Type 2 diabetes mellitus with hyperlipidemia ([place]) 06/28/2019   CKD (chronic kidney disease), stage IIIa 06/28/2019   Atrial fibrillation, chronic ([place]) 06/28/2019   Pulmonary edema  02/27/2019   Bradycardia 02/24/2019   Chronic respiratory failure with hypoxia ([place]) 02/23/2019   Atypical chest pain 02/13/2019   Osteopenia of neck of left femur 11/20/2018   AVM (arteriovenous malformation) of small bowel, acquired    Acute gastric ulcer with hemorrhage    Chronic diastolic heart failure ([place]) 08/22/2018   Diarrhea 08/22/2018   Junctional bradycardia    Acute on chronic heart failure with preserved ejection fraction (HFpEF) ([place])    AKI (acute kidney injury) ([place])    Symptomatic bradycardia 08/05/2018   Acute on chronic respiratory failure ([place]) 06/25/2018   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 diabetes mellitus ([place]) 01/25/2018   History of non anemic vitamin B12 deficiency 01/25/2018   Personal history of kidney stones 11/12/2017   Urge incontinence 11/12/2017   History of leukocytosis 09/17/2017   Right ureteral stone 09/15/2017   Acute GI bleeding    GI bleed 08/26/2017   Arthritis 08/10/2017   Stage 4 chronic kidney disease ([place]) 08/10/2017   COPD (chronic obstructive pulmonary disease) ([place]) 08/10/2017   Diabetes mellitus type 2, uncomplicated ([place]) 0000000   Hypertension 08/10/2017   Obesity (BMI 35.0-39.9 without comorbidity) 04/11/2017   Primary osteoarthritis of right knee 09/01/2016   Leucocytosis 10/19/2015   Asterixis 01/07/2015   Acute on chronic respiratory failure with hypoxia and hypercapnia ([place]) 01/07/2015   Sciatica 01/07/2015   Weakness 01/07/2015   Chronic 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 01/04/2015   Iron deficiency anemia 06/22/2014   Microalbuminuria 06/22/2014   CHF (congestive heart failure) ([place]) 04/20/2014   Edema, peripheral 04/20/2014    Orientation RESPIRATION BLADDER Height &  Weight     Self, Time, Situation, Place  O2 (4L) External catheter Weight: 102 kg Height:  '5\' 3"'$  (160 cm)  BEHAVIORAL SYMPTOMS/MOOD NEUROLOGICAL BOWEL NUTRITION STATUS      Continent Diet (heart)  AMBULATORY  STATUS COMMUNICATION OF NEEDS Skin     Verbally Skin abrasions, Other (Comment) (abrasion ecchymosis r arm/hand, head, face, eye)                       Personal Care Assistance Level of Assistance  Bathing, Feeding, Dressing           Functional Limitations Info  Sight, Hearing, Speech Sight Info: Adequate Hearing Info: Adequate Speech Info: Adequate    SPECIAL CARE FACTORS FREQUENCY  PT (By licensed PT), OT (By licensed OT)     PT Frequency: 5x weekly OT Frequency: 5x weekly            Contractures Contractures Info: Not present    Additional Factors Info  Code Status, Allergies Code Status Info: FULL CODE Allergies Info: Ace Inhibitors, Beta Adrenergic Blockers, Gabapentin, Lisinopril,  Lyrica,  Shrimp (shellfish Allergy)           Current Medications (11/03/2020):  This is the current hospital active medication list Current Facility-Administered Medications  Medication Dose Route Frequency Provider Last Rate Last Admin   0.9 %  sodium chloride infusion   Intravenous PRN Nicole Kindred A, DO       acetaminophen (TYLENOL) tablet 650 mg  650 mg Oral Q6H PRN Nicole Kindred A, DO   650 mg at 11/02/20 2116   apixaban (ELIQUIS) tablet 5 mg  5 mg Oral BID Cox, Amy N, DO   5 mg at 11/03/20 [id number]   atorvastatin (LIPITOR) tablet 10 mg  10 mg Oral QHS Cox, Amy N, DO   10 mg at 11/02/20 2116   chlorpheniramine-HYDROcodone (TUSSIONEX) 10-8 MG/5ML suspension 5 mL  5 mL Oral Q12H PRN Edwin Dada, MD   5 mL at 11/03/20 1256   dextromethorphan-guaiFENesin (MUCINEX DM) 30-600 MG per 12 hr tablet 1 tablet  1 tablet Oral BID Nicole Kindred A, DO   1 tablet at 11/03/20 [id number]   diclofenac Sodium (VOLTAREN) 1 % topical gel 2 g  2 g Topical QID Nicole Kindred A, DO   2 g at 11/03/20 1302   fluticasone furoate-vilanterol (BREO ELLIPTA) 100-25 MCG/INH 1 puff  1 puff Inhalation Daily Dorothe Pea, RPH   1 puff at 11/03/20 [id number]   And   umeclidinium bromide (INCRUSE  ELLIPTA) 62.5 MCG/INH 1 puff  1 puff Inhalation Daily Dorothe Pea, RPH   1 puff at 11/03/20 0929   guaiFENesin-dextromethorphan (ROBITUSSIN DM) 100-10 MG/5ML syrup 10 mL  10 mL Oral Q4H PRN Cox, Amy N, DO   10 mL at 11/02/20 2117   insulin aspart (novoLOG) injection 0-20 Units  0-20 Units Subcutaneous TID WC Danford, Suann Larry, MD   7 Units at 11/03/20 1256   insulin aspart (novoLOG) injection 0-5 Units  0-5 Units Subcutaneous QHS Cox, Amy N, DO   2 Units at 11/02/20 2117   insulin aspart (novoLOG) injection 12 Units  12 Units Subcutaneous TID WC Nicole Kindred A, DO   12 Units at 11/03/20 1255   insulin glargine-yfgn (SEMGLEE) injection 48 Units  48 Units Subcutaneous QHS Danford, Suann Larry, MD       linagliptin (TRADJENTA) tablet 5 mg  5 mg Oral Daily Danford, Suann Larry, MD  LORazepam (ATIVAN) injection 2 mg  2 mg Intravenous PRN Cox, Amy N, DO       methylPREDNISolone sodium succinate (SOLU-MEDROL) 40 mg/mL injection 40 mg  40 mg Intravenous Q12H Danford, Suann Larry, MD       montelukast (SINGULAIR) tablet 10 mg  10 mg Oral QHS Cox, Amy N, DO   10 mg at 11/02/20 2116   ondansetron (ZOFRAN) tablet 4 mg  4 mg Oral Q6H PRN Cox, Amy N, DO       Or   ondansetron (ZOFRAN) injection 4 mg  4 mg Intravenous Q6H PRN Cox, Amy N, DO       pantoprazole (PROTONIX) EC tablet 80 mg  80 mg Oral Q1200 Cox, Amy N, DO   80 mg at 11/03/20 1257   traZODone (DESYREL) tablet 50 mg  50 mg Oral QHS Nicole Kindred A, DO   50 mg at 11/02/20 2116     Discharge Medications: Please see discharge summary for a list of discharge medications.  Relevant Imaging Results:  Relevant Lab Results:   Additional Information    Pete Pelt, RN

## 2020-11-03 NOTE — Progress Notes (Signed)
[place] [facility] PROGRESS NOTE    Karen [name]  [id number] DOB: 1945-05-17 DOA: 10/29/2020 PCP: Karen Counts, FNP      Brief Narrative:  Karen [name] is a 75 y.o. F with COPD on 3L, dCHF, MO, DM on insulin pump, CKD IIIa baseline Cr 1.0 and hx bradycardia with BBs who presented with syncope, muscle aches, fatigue.  Recently developed flu like symptoms.  In the ER, found to have +COVID, CXR clear, hyperglycemic, lactate elevated.     Assessment & Plan:  COPD exacerbation -Continue steroids - Start zithromax -Continue bronchodilators -Continue PPI -Continue Singulair, ICS/LABA  COVID-19 asymptomatic  AKI on CKD IIIa Resolved  HTN BP normal -Hold losartan, HCTZ  Diabetes with hyperglycemia Glucoses elevated -Continue Lantus, increase dose -Continue Aspart, increase dose -Continue SS corrections - Continue linagliptin -Continue statin -Hold Trulicity  Paroxysmal atrial fibillation HR controlled -Continue Eliquis  Pulmonary nodules Incidental finding - Follow up CT chest in 12 months  Obesity BMI 39   Anemia of chronic disease             Disposition: Status is: Inpatient  Remains inpatient appropriate because:Unsafe d/c plan  Dispo: The patient is from: Home              Anticipated d/c is to: SNF              Patient currently is not medically stable to d/c.   Difficult to place patient No       Level of care: Med-Surg       MDM: The below labs and imaging reports were reviewed and summarized above.  Medication management as above.    DVT prophylaxis: Place TED hose Start: 10/29/20 1650 apixaban (ELIQUIS) tablet 5 mg  Code Status: FULL Family Communication: husband by phone           Subjective: Still very wheezy, feels generally weak.  No chest pain, confusion.  No fever.  Objective: Vitals:   11/03/20 0022 11/03/20 0538 11/03/20 0855 11/03/20 1504  BP: (!) 149/74 140/71 125/70 (!)  156/84  Pulse:  82 88 87  Resp: '18 18 16 18  '$ Temp:  98 F (36.7 C) 98.2 F (36.8 C) 98.6 F (37 C)  TempSrc:  Oral    SpO2: 97% 97% (!) 79% 95%  Weight:      Height:        Intake/Output Summary (Last 24 hours) at 11/03/2020 1606 Last data filed at 11/03/2020 0540 Gross per 24 hour  Intake --  Output 1700 ml  Net -1700 ml   Filed Weights   10/29/20 1234  Weight: 102 kg    Examination: General appearance: Obese adult female, alert and in no acute distress.   HEENT: Anicteric, conjunctiva pink, lids and lashes normal. No nasal deformity, discharge, epistaxis.  Lips moistno.   Skin: Warm and dry.  no jaundice.  No suspicious rashes or lesions. Cardiac: RRR, nl S1-S2, no murmurs appreciated.  Capillary refill is brisk.  JVP not visible.  No LE edema.  Radial  pulses 2+ and symmetric. Respiratory: Normal respiratory rate and rhythm.  No rales, not wheezing bilaterally. Abdomen: Abdomen soft.  no TTP. No ascites, distension, hepatosplenomegaly.   MSK: No deformities or effusions. Neuro: Awake and alert.  EOMI, moves all extremities severe generalized weakness. Speech fluent.    Psych: Sensorium intact and responding to questions, attention normal. Affect normal.  Judgment and insight appear normal.    Data Reviewed: I have personally  reviewed following labs and imaging studies:  CBC: Recent Labs  Lab 10/30/20 0416 10/31/20 0638 11/01/20 0521 11/02/20 0426 11/03/20 0421  WBC 8.3 10.4 9.3 9.8 10.3  NEUTROABS 6.0 9.4* 6.6 7.0 7.5  HGB 9.0* 9.4* 8.8* 9.6* 9.4*  HCT 28.9* 30.1* 28.1* 31.0* 30.3*  MCV 84.3 81.6 82.6 83.6 82.3  PLT 252 255 261 259 0000000   Basic Metabolic Panel: Recent Labs  Lab 10/30/20 0416 10/31/20 0638 11/01/20 0521 11/02/20 0426 11/03/20 0421  NA 135 133* 134* 136 136  K 4.0 4.6 3.7 4.2 3.8  CL 91* 88* 92* 95* 94*  CO2 38* 34* 35* 32 35*  GLUCOSE 224* 325* 127* 132* 154*  BUN 35* 37* 39* 36* 37*  CREATININE 1.19* 0.94 0.92 1.00 1.02*  CALCIUM  8.7* 9.5 9.0 9.0 8.8*  MG  --  2.0  --   --  1.9   GFR: Estimated Creatinine Clearance: 53.5 mL/min (A) (by C-G formula based on SCr of 1.02 mg/dL (H)). Liver Function Tests: Recent Labs  Lab 10/30/20 0416 10/31/20 [id number] 11/01/20 0521 11/02/20 0426 11/03/20 0421  AST 16 39 21 16 12*  ALT '17 29 23 22 19  '$ ALKPHOS 64 71 68 69 67  BILITOT 0.7 0.8 0.7 0.7 0.8  PROT 6.8 7.2 6.2* 6.3* 6.5  ALBUMIN 3.4* 3.5 3.1* 3.2* 3.3*   No results for input(s): LIPASE, AMYLASE in the last 168 hours. No results for input(s): AMMONIA in the last 168 hours. Coagulation Profile: No results for input(s): INR, PROTIME in the last 168 hours. Cardiac Enzymes: No results for input(s): CKTOTAL, CKMB, CKMBINDEX, TROPONINI in the last 168 hours. BNP (last 3 results) No results for input(s): PROBNP in the last 8760 hours. HbA1C: No results for input(s): HGBA1C in the last 72 hours. CBG: Recent Labs  Lab 11/02/20 1222 11/02/20 1721 11/02/20 2038 11/03/20 0853 11/03/20 1136  GLUCAP 239* 224* 247* 149* 207*   Lipid Profile: No results for input(s): CHOL, HDL, LDLCALC, TRIG, CHOLHDL, LDLDIRECT in the last 72 hours. Thyroid Function Tests: No results for input(s): TSH, T4TOTAL, FREET4, T3FREE, THYROIDAB in the last 72 hours. Anemia Panel: No results for input(s): VITAMINB12, FOLATE, FERRITIN, TIBC, IRON, RETICCTPCT in the last 72 hours. Urine analysis:    Component Value Date/Time   COLORURINE YELLOW (A) 10/29/2020 1947   APPEARANCEUR HAZY (A) 10/29/2020 1947   APPEARANCEUR Cloudy (A) 09/28/2017 1338   LABSPEC 1.011 10/29/2020 1947   LABSPEC 1.024 04/02/2014 0517   PHURINE 5.0 10/29/2020 1947   GLUCOSEU NEGATIVE 10/29/2020 1947   GLUCOSEU >=500 04/02/2014 0517   HGBUR NEGATIVE 10/29/2020 1947   BILIRUBINUR NEGATIVE 10/29/2020 1947   BILIRUBINUR Negative 09/28/2017 [place] Negative 04/02/2014 [place] 10/29/2020 1947   PROTEINUR 30 (A) 10/29/2020 1947   NITRITE  POSITIVE (A) 10/29/2020 1947   LEUKOCYTESUR SMALL (A) 10/29/2020 1947   LEUKOCYTESUR Trace 04/02/2014 0517   Sepsis Labs: '@LABRCNTIP'$ (procalcitonin:4,lacticacidven:4)  ) Recent Results (from the past 240 hour(s))  Blood culture (routine x 2)     Status: None   Collection Time: 10/29/20  2:25 PM   Specimen: BLOOD  Result Value Ref Range Status   Specimen Description BLOOD BLOOD LEFT HAND  Final   Special Requests   Final    BOTTLES DRAWN AEROBIC AND ANAEROBIC Blood Culture adequate volume   Culture   Final    NO GROWTH 5 DAYS Performed at [facility], [address], [place], [place] [address]  Report Status 11/03/2020 FINAL  Final  Resp Panel by RT-PCR (Flu A&B, Covid) Nasopharyngeal Swab     Status: Abnormal   Collection Time: 10/29/20  2:26 PM   Specimen: Nasopharyngeal Swab; Nasopharyngeal(NP) swabs in vial transport medium  Result Value Ref Range Status   SARS Coronavirus 2 by RT PCR POSITIVE (A) NEGATIVE Final    Comment: RESULT CALLED TO, READ BACK BY AND VERIFIED WITH: ROBIN REGISTER AT 1559 ON 10/29/20 BY SS (NOTE) SARS-CoV-2 target nucleic acids are DETECTED.  The SARS-CoV-2 RNA is generally detectable in upper respiratory specimens during the acute phase of infection. Positive results are indicative of the presence of the identified virus, but do not rule out bacterial infection or co-infection with other pathogens not detected by the test. Clinical correlation with patient history and other diagnostic information is necessary to determine patient infection status. The expected result is Negative.  Fact Sheet for Patients: EntrepreneurPulse.com.au  Fact Sheet for Healthcare Providers: IncredibleEmployment.be  This test is not yet approved or cleared by the [place] FDA and  has been authorized for detection and/or diagnosis of SARS-CoV-2 by FDA under an Emergency Use Authorization (EUA).  This EUA will remain  in effect (meaning this test  can be used) for the duration of  the COVID-19 declaration under Section 564(b)(1) of the Act, 21 U.S.C. section 360bbb-3(b)(1), unless the authorization is terminated or revoked sooner.     Influenza A by PCR NEGATIVE NEGATIVE Final   Influenza B by PCR NEGATIVE NEGATIVE Final    Comment: (NOTE) The Xpert Xpress SARS-CoV-2/FLU/RSV plus assay is intended as an aid in the diagnosis of influenza from Nasopharyngeal swab specimens and should not be used as a sole basis for treatment. Nasal washings and aspirates are unacceptable for Xpert Xpress SARS-CoV-2/FLU/RSV testing.  Fact Sheet for Patients: EntrepreneurPulse.com.au  Fact Sheet for Healthcare Providers: IncredibleEmployment.be  This test is not yet approved or cleared by the [place] FDA and has been authorized for detection and/or diagnosis of SARS-CoV-2 by FDA under an Emergency Use Authorization (EUA). This EUA will remain in effect (meaning this test can be used) for the duration of the COVID-19 declaration under Section 564(b)(1) of the Act, 21 U.S.C. section 360bbb-3(b)(1), unless the authorization is terminated or revoked.  Performed at [facility], [address] [place], [place] [address]   Blood culture (routine x 2)     Status: None   Collection Time: 10/29/20  2:28 PM   Specimen: BLOOD  Result Value Ref Range Status   Specimen Description BLOOD BLOOD RIGHT HAND  Final   Special Requests   Final    BOTTLES DRAWN AEROBIC AND ANAEROBIC Blood Culture adequate volume   Culture   Final    NO GROWTH 5 DAYS Performed at [facility], [address] [place], [place] [address]    Report Status 11/03/2020 FINAL  Final         Radiology Studies: DG Chest Port 1 View  Result Date: 11/02/2020 CLINICAL DATA:  Pleural effusion EXAM: PORTABLE CHEST 1 VIEW COMPARISON:  Portable exam 0801 hours compared to 10/29/2020  FINDINGS: Enlargement of cardiac silhouette with pulmonary vascular congestion. Atherosclerotic calcification aorta. Persistent RIGHT pleural effusion and basilar atelectasis. Accentuation of interstitial markings in the perihilar regions question minimal edema. No pneumothorax or acute osseous findings. IMPRESSION: Enlargement of cardiac silhouette with pulmonary vascular congestion and question minimal pulmonary edema. Persistent RIGHT pleural effusion and basilar atelectasis. Aortic Atherosclerosis (ICD10-I70.0). Electronically Signed   By: Elta Guadeloupe  Thornton Papas M.D.   On: 11/02/2020 10:38        Scheduled Meds:  apixaban  5 mg Oral BID   atorvastatin  10 mg Oral QHS   dextromethorphan-guaiFENesin  1 tablet Oral BID   diclofenac Sodium  2 g Topical QID   fluticasone furoate-vilanterol  1 puff Inhalation Daily   And   umeclidinium bromide  1 puff Inhalation Daily   insulin aspart  0-20 Units Subcutaneous TID WC   insulin aspart  0-5 Units Subcutaneous QHS   insulin aspart  12 Units Subcutaneous TID WC   insulin glargine-yfgn  48 Units Subcutaneous QHS   linagliptin  5 mg Oral Daily   methylPREDNISolone (SOLU-MEDROL) injection  40 mg Intravenous Q12H   montelukast  10 mg Oral QHS   pantoprazole  80 mg Oral Q1200   traZODone  50 mg Oral QHS   Continuous Infusions:  sodium chloride       LOS: 4 days    Time spent: 25 minutes    Edwin Dada, MD [facility] 11/03/2020, 4:06 PM     Please page though AMION or Epic secure chat:  For [employer], Adult nurse

## 2020-11-03 NOTE — Care Management Important Message (Signed)
Important Message  Patient Details  Name: Karen [name] MRN: [id number] Date of Birth: 07-May-1945   Medicare Important Message Given:  Yes  Patient is in an isolated room so I talked with her by phone [phone]) and reviewed her Important Message from Medicare with her. Said she had to talked with her doctor and may opt to go to rehab. before returning to home. I wished her well and thanked her for her time.  Juliann Pulse A Mansa Willers 11/03/2020, 11:25 AM

## 2020-11-04 LAB — BASIC METABOLIC PANEL
Anion gap: 7 (ref 5–15)
BUN: 39 mg/dL — ABNORMAL HIGH (ref 8–23)
CO2: 36 mmol/L — ABNORMAL HIGH (ref 22–32)
Calcium: 9.1 mg/dL (ref 8.9–10.3)
Chloride: 92 mmol/L — ABNORMAL LOW (ref 98–111)
Creatinine, Ser: 1.12 mg/dL — ABNORMAL HIGH (ref 0.44–1.00)
GFR, Estimated: 51 mL/min — ABNORMAL LOW (ref >60)
Glucose, Bld: 251 mg/dL — ABNORMAL HIGH (ref 70–99)
Potassium: 4.7 mmol/L (ref 3.5–5.1)
Sodium: 135 mmol/L (ref 135–145)

## 2020-11-04 LAB — CBC
HCT: 32 % — ABNORMAL LOW (ref 36.0–46.0)
Hemoglobin: 10 g/dL — ABNORMAL LOW (ref 12.0–15.0)
MCH: 26 pg (ref 26.0–34.0)
MCHC: 31.3 g/dL (ref 30.0–36.0)
MCV: 83.1 fL (ref 80.0–100.0)
Platelets: 279 10*3/uL (ref 150–400)
RBC: 3.85 MIL/uL — ABNORMAL LOW (ref 3.87–5.11)
RDW: 14.6 % (ref 11.5–15.5)
WBC: 11.6 10*3/uL — ABNORMAL HIGH (ref 4.0–10.5)
nRBC: 0 % (ref 0.0–0.2)

## 2020-11-04 LAB — GLUCOSE, CAPILLARY
Glucose-Capillary: 241 mg/dL — ABNORMAL HIGH (ref 70–99)
Glucose-Capillary: 384 mg/dL — ABNORMAL HIGH (ref 70–99)
Glucose-Capillary: 232 mg/dL — ABNORMAL HIGH (ref 70–99)
Glucose-Capillary: 228 mg/dL — ABNORMAL HIGH (ref 70–99)

## 2020-11-04 MED ORDER — OXYCODONE HCL 5 MG PO TABS
5.0000 mg | ORAL_TABLET | ORAL | Status: DC | PRN
  Administered 2020-11-04 – 2020-11-09 (×11): 5 mg via ORAL
  Filled 2020-11-04 (×11): qty 1

## 2020-11-04 MED ORDER — INSULIN GLARGINE-YFGN 100 UNIT/ML ~~LOC~~ SOLN
27.0000 [IU] | Freq: Two times a day (BID) | SUBCUTANEOUS | Status: DC
  Administered 2020-11-04 (×2): 27 [IU] via SUBCUTANEOUS
  Filled 2020-11-04 (×4): qty 0.27

## 2020-11-04 NOTE — Progress Notes (Signed)
Inpatient Diabetes Program Recommendations  AACE/ADA: New Consensus Statement on Inpatient Glycemic Control (2015)  Target Ranges:  Prepandial:   less than 140 mg/dL      Peak postprandial:   less than 180 mg/dL (1-2 hours)      Critically ill patients:  140 - 180 mg/dL   Lab Results  Component Value Date   GLUCAP 241 (H) 11/04/2020   HGBA1C 9.5 (H) 10/29/2020    Review of Glycemic Control Results for Karen [name], Karen [name] (MRN [id number]) as of 11/04/2020 10:24  Ref. Range 11/03/2020 08:53 11/03/2020 11:36 11/03/2020 16:40 11/03/2020 20:59 11/04/2020 08:10  Glucose-Capillary Latest Ref Range: 70 - 99 mg/dL 149 (H) 207 (H) 263 (H) 312 (H) 241 (H)   Inpatient Diabetes Program Recommendations:   Noted Semglee increased to 27 units bid Consider while on steroids: -Increase Novolog meal coverage to 14-15 units tid if eats 50% Secure chat sent to Dr. Loleta Books.  Thank you, Nani Gasser. Edwen Mclester, RN, MSN, CDE  Diabetes Coordinator Inpatient Glycemic Control Team Team Pager [phone] (8am-5pm) 11/04/2020 10:26 AM

## 2020-11-04 NOTE — Progress Notes (Signed)
Occupational Therapy Treatment Patient Details Name: Karen [name] MRN: [id number] DOB: Feb 02, 1946 Today's Date: 11/04/2020    History of present illness Pt is a 74 y.o. female presenting to hospital 8/26 with loss of consciousness and SOB.  Pt admitted with syncope, acute hypoxemic respiratory failure secondary to COVID-19 infection (near complete collapse of R lower lobe of lung), AKI on CKD, and R pleural effusion.   S/p fall 10/30/20.  PMH includes COPD on 3 L O2 baseline, HF, anemia, DM, h/o blood clots on Eliquis, CKD, sciatica, and a-fib.   OT comments  Pt agreeable to OT intervention this session. Bed mobility to EOB with supervision and use of bed rails. Pt maintaining balance with supervision and set up to wash her hair. Pt needing assisting obtaining needed items. Pt declined B UE HEP and OOB activity secondary to fatigue. Pt returning to supine with supervision and assisted with repositioning in bed. All needs within reach and bed alarm activated. Pt remains on 3 L O2 via [place] with O2 saturation remaining above 90% this session.   Follow Up Recommendations  SNF;Supervision/Assistance - 24 hour    Equipment Recommendations  3 in 1 bedside commode       Precautions / Restrictions Precautions Precautions: Fall Precaution Comments: monitor O2 Restrictions Weight Bearing Restrictions: No       Mobility Bed Mobility Overal bed mobility: Needs Assistance Bed Mobility: Supine to Sit;Sit to Supine     Supine to sit: Supervision Sit to supine: Supervision   General bed mobility comments: increased effort to perform on own and utilized bed rail    Transfers Overall transfer level: Needs assistance Equipment used: Rolling walker (2 wheeled) Transfers: Sit to/from Stand Sit to Stand: Min guard;Min assist         General transfer comment: vc's for UE placement; x3 trials    Balance Overall balance assessment: Needs assistance Sitting-balance support: No upper  extremity supported;Feet supported Sitting balance-Leahy Scale: Good Sitting balance - Comments: steady sitting reaching within BOS   Standing balance support: Single extremity supported Standing balance-Leahy Scale: Fair Standing balance comment: pt requiring at least single UE support for static standing balance                           ADL either performed or assessed with clinical judgement     Vision Patient Visual Report: No change from baseline            Cognition Arousal/Alertness: Awake/alert Behavior During Therapy: WFL for tasks assessed/performed Overall Cognitive Status: Within Functional Limits for tasks assessed                                                General Comments bruising to forehead and B eyes    Pertinent Vitals/ Pain       Pain Assessment: No/denies pain         Frequency  Min 2X/week        Progress Toward Goals  OT Goals(current goals can now be found in the care plan section)  Progress towards OT goals: Progressing toward goals  Acute Rehab OT Goals Patient Stated Goal: to improve breathing OT Goal Formulation: With patient Time For Goal Achievement: 11/15/20 Potential to Achieve Goals: [place] Discharge plan remains appropriate;Frequency remains appropriate  AM-PAC OT "6 Clicks" Daily Activity     Outcome Measure   Help from another person eating meals?: None Help from another person taking care of personal grooming?: A Little Help from another person toileting, which includes using toliet, bedpan, or urinal?: A Little Help from another person bathing (including washing, rinsing, drying)?: A Little Help from another person to put on and taking off regular upper body clothing?: None Help from another person to put on and taking off regular lower body clothing?: A Little 6 Click Score: 20    End of Session Equipment Utilized During Treatment: Oxygen  OT Visit Diagnosis:  Unsteadiness on feet (R26.81);Repeated falls (R29.6);Muscle weakness (generalized) (M62.81);History of falling (Z91.81)   Activity Tolerance Patient tolerated treatment well   Patient Left in bed;with call bell/phone within reach;with bed alarm set   Nurse Communication Mobility status        Time: 1510-1539 OT Time Calculation (min): 29 min  Charges: OT General Charges $OT Visit: 1 Visit OT Treatments $Self Care/Home Management : 23-37 mins  Darleen Crocker, MS, OTR/L , CBIS ascom [phone]  11/04/20, 3:49 PM

## 2020-11-04 NOTE — Progress Notes (Signed)
[facility] PROGRESS NOTE    DAILY PROVINS  [id number] DOB: 20-Dec-1945 DOA: 10/29/2020 PCP: Earlie Counts, FNP      Brief Narrative:  Karen [name] is a 75 y.o. F with COPD on 3L, dCHF, MO, DM on insulin pump, CKD IIIa baseline Cr 1.0 and hx bradycardia with BBs who presented with syncope, muscle aches, fatigue.  Recently developed flu like symptoms.  In the ER, found to have +COVID, CXR clear, hyperglycemic, lactate elevated.     Assessment & Plan:  COPD exacerbation Admitted with wheezing, cough, dyspnea. - Continue steroids - Continue Zithromax - Continue BDs - Continue PPI - Continue Singulair - Continue ICs/LABA  COVID-19 asymptomatic Tested positive 8/26.  Symptoms (myalgias) started 2 days prior to that, now resolved.  Completed 5 days remdesivir.  Isolation can be d/c'd Sep 4.  AKI on CKD IIIa Resolved  Hypertension BP normal off meds - Hold losartan, HCTZ  Diabetes with steroid-induced hyperglycemia Glucoses remain elevated - Continue Lantus, increase dose - Continue aspart, scheduled - Continue sliding scale corrections - Continue linagliptin - Continue statin - Hold Trulicity  Paroxysmal atrial fibrillation Heart rate normal - Continue Eliquis  Pulmonary nodules Incidental finding - Follow up CT chest in 12 months  Obesity BMI 39   Anemia of chronic disease             Disposition: Status is: Inpatient  Remains inpatient appropriate because:Unsafe d/c plan  Dispo: The patient is from: Home              Anticipated d/c is to: SNF              Patient currently is not medically stable to d/c.   Difficult to place patient No       Level of care: Med-Surg       MDM: The below labs and imaging reports were reviewed and summarized above.  Medication management as above.    DVT prophylaxis: Place TED hose Start: 10/29/20 1650 apixaban (ELIQUIS) tablet 5 mg  Code Status: FULL Family  Communication:             Subjective: Wheezing is improved, still has some shortness of breath, no chest pain, fever, confusion.  She is still generally weak.  No sputum, hemoptysis.  Objective: Vitals:   11/04/20 0529 11/04/20 0810 11/04/20 1215 11/04/20 1500  BP: (!) 117/105 122/66 (!) 143/68 138/64  Pulse: 87 89 75 78  Resp: 18 18    Temp: 97.8 F (36.6 C) 98.2 F (36.8 C) 97.9 F (36.6 C) 97.8 F (36.6 C)  TempSrc:   Oral Oral  SpO2: 93% 90% 95% 95%  Weight:      Height:        Intake/Output Summary (Last 24 hours) at 11/04/2020 1745 Last data filed at 11/04/2020 0900 Gross per 24 hour  Intake --  Output 1100 ml  Net -1100 ml   Filed Weights   10/29/20 1234  Weight: 102 kg    Examination: General appearance: Adult female, sitting on the edge of the bed, no acute distress     HEENT:    Skin:  Cardiac: Irregularly irregular, no murmurs, no lower extremity edema Respiratory: Wheezing bilaterally, improved from yesterday, no rales. Abdomen: Abdomen soft without tenderness palpation or guarding, no ascites or distention MSK:  Neuro: Awake and alert, extraocular movements intact, moves all extremities with generalized weakness, speech fluent Psych: Attention normal, affect appropriate, judgment insight appear normal  Data Reviewed: I have personally reviewed following labs and imaging studies:  CBC: Recent Labs  Lab 10/30/20 0416 10/31/20 [id number] 11/01/20 0521 11/02/20 0426 11/03/20 0421 11/04/20 0317  WBC 8.3 10.4 9.3 9.8 10.3 11.6*  NEUTROABS 6.0 9.4* 6.6 7.0 7.5  --   HGB 9.0* 9.4* 8.8* 9.6* 9.4* 10.0*  HCT 28.9* 30.1* 28.1* 31.0* 30.3* 32.0*  MCV 84.3 81.6 82.6 83.6 82.3 83.1  PLT 252 255 261 259 278 [id number]   Basic Metabolic Panel: Recent Labs  Lab 10/31/20 0638 11/01/20 0521 11/02/20 0426 11/03/20 0421 11/04/20 0317  NA 133* 134* 136 136 135  K 4.6 3.7 4.2 3.8 4.7  CL 88* 92* 95* 94* 92*  CO2 34* 35* 32 35* 36*  GLUCOSE 325* 127* 132*  154* 251*  BUN 37* 39* 36* 37* 39*  CREATININE 0.94 0.92 1.00 1.02* 1.12*  CALCIUM 9.5 9.0 9.0 8.8* 9.1  MG 2.0  --   --  1.9  --    GFR: Estimated Creatinine Clearance: 48.7 mL/min (A) (by C-G formula based on SCr of 1.12 mg/dL (H)). Liver Function Tests: Recent Labs  Lab 10/30/20 0416 10/31/20 [id number] 11/01/20 0521 11/02/20 0426 11/03/20 0421  AST 16 39 21 16 12*  ALT '17 29 23 22 19  '$ ALKPHOS 64 71 68 69 67  BILITOT 0.7 0.8 0.7 0.7 0.8  PROT 6.8 7.2 6.2* 6.3* 6.5  ALBUMIN 3.4* 3.5 3.1* 3.2* 3.3*   No results for input(s): LIPASE, AMYLASE in the last 168 hours. No results for input(s): AMMONIA in the last 168 hours. Coagulation Profile: No results for input(s): INR, PROTIME in the last 168 hours. Cardiac Enzymes: No results for input(s): CKTOTAL, CKMB, CKMBINDEX, TROPONINI in the last 168 hours. BNP (last 3 results) No results for input(s): PROBNP in the last 8760 hours. HbA1C: No results for input(s): HGBA1C in the last 72 hours. CBG: Recent Labs  Lab 11/03/20 1640 11/03/20 2059 11/04/20 0810 11/04/20 1145 11/04/20 1701  GLUCAP 263* 312* 241* 384* 232*   Lipid Profile: No results for input(s): CHOL, HDL, LDLCALC, TRIG, CHOLHDL, LDLDIRECT in the last 72 hours. Thyroid Function Tests: No results for input(s): TSH, T4TOTAL, FREET4, T3FREE, THYROIDAB in the last 72 hours. Anemia Panel: No results for input(s): VITAMINB12, FOLATE, FERRITIN, TIBC, IRON, RETICCTPCT in the last 72 hours. Urine analysis:    Component Value Date/Time   COLORURINE YELLOW (A) 10/29/2020 1947   APPEARANCEUR HAZY (A) 10/29/2020 1947   APPEARANCEUR Cloudy (A) 09/28/2017 1338   LABSPEC 1.011 10/29/2020 1947   LABSPEC 1.024 04/02/2014 0517   PHURINE 5.0 10/29/2020 1947   GLUCOSEU NEGATIVE 10/29/2020 1947   GLUCOSEU >=500 04/02/2014 0517   HGBUR NEGATIVE 10/29/2020 1947   BILIRUBINUR NEGATIVE 10/29/2020 1947   BILIRUBINUR Negative 09/28/2017 [place] Negative 04/02/2014 [place] 10/29/2020 1947   PROTEINUR 30 (A) 10/29/2020 1947   NITRITE POSITIVE (A) 10/29/2020 1947   LEUKOCYTESUR SMALL (A) 10/29/2020 1947   LEUKOCYTESUR Trace 04/02/2014 0517   Sepsis Labs: '@LABRCNTIP'$ (procalcitonin:4,lacticacidven:4)  ) Recent Results (from the past 240 hour(s))  Blood culture (routine x 2)     Status: None   Collection Time: 10/29/20  2:25 PM   Specimen: BLOOD  Result Value Ref Range Status   Specimen Description BLOOD BLOOD LEFT HAND  Final   Special Requests   Final    BOTTLES DRAWN AEROBIC AND ANAEROBIC Blood Culture adequate volume   Culture   Final    NO GROWTH 5 DAYS  Performed at [facility], [address] [place], [place] [address]    Report Status 11/03/2020 FINAL  Final  Resp Panel by RT-PCR (Flu A&B, Covid) Nasopharyngeal Swab     Status: Abnormal   Collection Time: 10/29/20  2:26 PM   Specimen: Nasopharyngeal Swab; Nasopharyngeal(NP) swabs in vial transport medium  Result Value Ref Range Status   SARS Coronavirus 2 by RT PCR POSITIVE (A) NEGATIVE Final    Comment: RESULT CALLED TO, READ BACK BY AND VERIFIED WITH: ROBIN REGISTER AT 1559 ON 10/29/20 BY SS (NOTE) SARS-CoV-2 target nucleic acids are DETECTED.  The SARS-CoV-2 RNA is generally detectable in upper respiratory specimens during the acute phase of infection. Positive results are indicative of the presence of the identified virus, but do not rule out bacterial infection or co-infection with other pathogens not detected by the test. Clinical correlation with patient history and other diagnostic information is necessary to determine patient infection status. The expected result is Negative.  Fact Sheet for Patients: EntrepreneurPulse.com.au  Fact Sheet for Healthcare Providers: IncredibleEmployment.be  This test is not yet approved or cleared by the [place] FDA and  has been authorized for detection and/or diagnosis of  SARS-CoV-2 by FDA under an Emergency Use Authorization (EUA).  This EUA will remain in effect (meaning this test  can be used) for the duration of  the COVID-19 declaration under Section 564(b)(1) of the Act, 21 U.S.C. section 360bbb-3(b)(1), unless the authorization is terminated or revoked sooner.     Influenza A by PCR NEGATIVE NEGATIVE Final   Influenza B by PCR NEGATIVE NEGATIVE Final    Comment: (NOTE) The Xpert Xpress SARS-CoV-2/FLU/RSV plus assay is intended as an aid in the diagnosis of influenza from Nasopharyngeal swab specimens and should not be used as a sole basis for treatment. Nasal washings and aspirates are unacceptable for Xpert Xpress SARS-CoV-2/FLU/RSV testing.  Fact Sheet for Patients: EntrepreneurPulse.com.au  Fact Sheet for Healthcare Providers: IncredibleEmployment.be  This test is not yet approved or cleared by the [place] FDA and has been authorized for detection and/or diagnosis of SARS-CoV-2 by FDA under an Emergency Use Authorization (EUA). This EUA will remain in effect (meaning this test can be used) for the duration of the COVID-19 declaration under Section 564(b)(1) of the Act, 21 U.S.C. section 360bbb-3(b)(1), unless the authorization is terminated or revoked.  Performed at [facility], [address] [place], [place] [address]   Blood culture (routine x 2)     Status: None   Collection Time: 10/29/20  2:28 PM   Specimen: BLOOD  Result Value Ref Range Status   Specimen Description BLOOD BLOOD RIGHT HAND  Final   Special Requests   Final    BOTTLES DRAWN AEROBIC AND ANAEROBIC Blood Culture adequate volume   Culture   Final    NO GROWTH 5 DAYS Performed at [facility], [address], [place], [place] [address]    Report Status 11/03/2020 FINAL  Final         Radiology Studies: No results found.      Scheduled Meds:  apixaban  5 mg Oral BID   atorvastatin  10  mg Oral QHS   dextromethorphan-guaiFENesin  1 tablet Oral BID   diclofenac Sodium  2 g Topical QID   fluticasone furoate-vilanterol  1 puff Inhalation Daily   And   umeclidinium bromide  1 puff Inhalation Daily   insulin aspart  0-20 Units Subcutaneous TID WC   insulin aspart  0-5  Units Subcutaneous QHS   insulin aspart  12 Units Subcutaneous TID WC   insulin glargine-yfgn  27 Units Subcutaneous BID   linagliptin  5 mg Oral Daily   methylPREDNISolone (SOLU-MEDROL) injection  40 mg Intravenous Q12H   montelukast  10 mg Oral QHS   pantoprazole  80 mg Oral Q1200   traZODone  50 mg Oral QHS   Continuous Infusions:  sodium chloride       LOS: 5 days    Time spent: 25 minutes    Edwin Dada, MD [facility] 11/04/2020, 5:45 PM     Please page though [place] or Epic secure chat:  For [employer], Adult nurse

## 2020-11-04 NOTE — Progress Notes (Signed)
Physical Therapy Treatment Patient Details Name: Karen [name] MRN: [id number] DOB: 1945-06-27 Today's Date: 11/04/2020    History of Present Illness Pt is a 75 y.o. female presenting to hospital 8/26 with loss of consciousness and SOB.  Pt admitted with syncope, acute hypoxemic respiratory failure secondary to COVID-19 infection (near complete collapse of R lower lobe of lung), AKI on CKD, and R pleural effusion.   S/p fall 10/30/20.  PMH includes COPD on 3 L O2 baseline, HF, anemia, DM, h/o blood clots on Eliquis, CKD, sciatica, and a-fib.    PT Comments    Pt resting in bed upon PT arrival; pt reporting being tired and not feeling great but agreeable to therapy session.  Per discussion with pt's nurse, increased O2 to 4 L for activity during session (from 3 L at rest).  During session pt SBA with bed mobility; CGA to min assist with transfers; and CGA taking steps in place (x5 reps B LE's x3 trials with RW use).  Limited ability to take steps in place d/t SOB so pt given sitting rest breaks between trials (O2 sats 90% or greater on 4 L O2 via nasal cannula during sessions activities and O2 93% on 3 L O2 end of session at rest).  Will continue to focus on strengthening, balance, and progressive functional mobility per pt tolerance.    Follow Up Recommendations  SNF     Equipment Recommendations  Rolling walker with 5" wheels;3in1 (PT);Wheelchair (measurements PT);Wheelchair cushion (measurements PT)    Recommendations for Other Services       Precautions / Restrictions Precautions Precautions: Fall Precaution Comments: monitor O2 Restrictions Weight Bearing Restrictions: No    Mobility  Bed Mobility Overal bed mobility: Needs Assistance Bed Mobility: Supine to Sit;Sit to Supine     Supine to sit: Supervision;HOB elevated Sit to supine: Supervision;HOB elevated   General bed mobility comments: increased effort to perform on own    Transfers Overall transfer level: Needs  assistance Equipment used: Rolling walker (2 wheeled) Transfers: Sit to/from Stand Sit to Stand: Min guard;Min assist         General transfer comment: vc's for UE placement; x3 trials  Ambulation/Gait Ambulation/Gait assistance: Min guard Gait Distance (Feet):  (x5 reps marching in place B LE's (x3 trials)) Assistive device: Rolling walker (2 wheeled)   Gait velocity: decreased   General Gait Details: decreased B LE step  height; limited d/t SOB   Stairs             Wheelchair Mobility    Modified Rankin (Stroke Patients Only)       Balance Overall balance assessment: Needs assistance Sitting-balance support: No upper extremity supported;Feet supported Sitting balance-Leahy Scale: Good Sitting balance - Comments: steady sitting reaching within BOS   Standing balance support: Single extremity supported Standing balance-Leahy Scale: Fair Standing balance comment: pt requiring at least single UE support for static standing balance                            Cognition Arousal/Alertness: Awake/alert Behavior During Therapy: WFL for tasks assessed/performed Overall Cognitive Status: Within Functional Limits for tasks assessed                                        Exercises      General Comments General comments (skin integrity, edema, etc.): bruising  to forehead and B eyes.  Nursing cleared pt for participation in physical therapy.  Pt agreeable to PT session.      Pertinent Vitals/Pain Pain Assessment: 0-10 Pain Score: 9  Pain Location: low back Pain Descriptors / Indicators: Aching;Discomfort (chronic) Pain Intervention(s): Limited activity within patient's tolerance;Monitored during session;Repositioned;Patient requesting pain meds-RN notified    Home Living                      Prior Function            PT Goals (current goals can now be found in the care plan section) Acute Rehab PT Goals Patient Stated  Goal: to improve breathing PT Goal Formulation: With patient Time For Goal Achievement: 11/15/20 Potential to Achieve Goals: Fair Progress towards PT goals: Progressing toward goals    Frequency    Min 2X/week      PT Plan Current plan remains appropriate    Co-evaluation              AM-PAC PT "6 Clicks" Mobility   Outcome Measure  Help needed turning from your back to your side while in a flat bed without using bedrails?: None Help needed moving from lying on your back to sitting on the side of a flat bed without using bedrails?: A Little Help needed moving to and from a bed to a chair (including a wheelchair)?: A Little Help needed standing up from a chair using your arms (e.g., wheelchair or bedside chair)?: A Little Help needed to walk in hospital room?: A Little Help needed climbing 3-5 steps with a railing? : A Lot 6 Click Score: 18    End of Session Equipment Utilized During Treatment: Gait belt;Oxygen Activity Tolerance: Patient limited by fatigue Patient left: in bed;with call bell/phone within reach;with bed alarm set Nurse Communication: Mobility status;Precautions PT Visit Diagnosis: Other abnormalities of gait and mobility (R26.89);Muscle weakness (generalized) (M62.81);Difficulty in walking, not elsewhere classified (R26.2)     Time: [id number] PT Time Calculation (min) (ACUTE ONLY): 38 min  Charges:  $Therapeutic Exercise: 8-22 mins $Therapeutic Activity: 23-37 mins                    Leitha Bleak, PT 11/04/20, 12:17 PM

## 2020-11-05 LAB — CBC
HCT: 31.3 % — ABNORMAL LOW (ref 36.0–46.0)
Hemoglobin: 9.6 g/dL — ABNORMAL LOW (ref 12.0–15.0)
MCH: 25.1 pg — ABNORMAL LOW (ref 26.0–34.0)
MCHC: 30.7 g/dL (ref 30.0–36.0)
MCV: 81.7 fL (ref 80.0–100.0)
Platelets: 288 10*3/uL (ref 150–400)
RBC: 3.83 MIL/uL — ABNORMAL LOW (ref 3.87–5.11)
RDW: 14.8 % (ref 11.5–15.5)
WBC: 12.8 10*3/uL — ABNORMAL HIGH (ref 4.0–10.5)
nRBC: 0 % (ref 0.0–0.2)

## 2020-11-05 LAB — BASIC METABOLIC PANEL
Anion gap: 5 (ref 5–15)
BUN: 48 mg/dL — ABNORMAL HIGH (ref 8–23)
CO2: 35 mmol/L — ABNORMAL HIGH (ref 22–32)
Calcium: 8.9 mg/dL (ref 8.9–10.3)
Chloride: 93 mmol/L — ABNORMAL LOW (ref 98–111)
Creatinine, Ser: 1.1 mg/dL — ABNORMAL HIGH (ref 0.44–1.00)
GFR, Estimated: 52 mL/min — ABNORMAL LOW (ref >60)
Glucose, Bld: 163 mg/dL — ABNORMAL HIGH (ref 70–99)
Potassium: 4.6 mmol/L (ref 3.5–5.1)
Sodium: 133 mmol/L — ABNORMAL LOW (ref 135–145)

## 2020-11-05 LAB — GLUCOSE, CAPILLARY
Glucose-Capillary: 240 mg/dL — ABNORMAL HIGH (ref 70–99)
Glucose-Capillary: 181 mg/dL — ABNORMAL HIGH (ref 70–99)
Glucose-Capillary: 150 mg/dL — ABNORMAL HIGH (ref 70–99)
Glucose-Capillary: 209 mg/dL — ABNORMAL HIGH (ref 70–99)

## 2020-11-05 LAB — C-REACTIVE PROTEIN: CRP: 0.6 mg/dL (ref <1.0)

## 2020-11-05 MED ORDER — INSULIN GLARGINE-YFGN 100 UNIT/ML ~~LOC~~ SOLN
30.0000 [IU] | Freq: Two times a day (BID) | SUBCUTANEOUS | Status: DC
  Administered 2020-11-05 – 2020-11-06 (×3): 30 [IU] via SUBCUTANEOUS
  Filled 2020-11-05 (×4): qty 0.3

## 2020-11-05 MED ORDER — INSULIN ASPART 100 UNIT/ML IJ SOLN
15.0000 [IU] | Freq: Three times a day (TID) | INTRAMUSCULAR | Status: DC
  Administered 2020-11-05 – 2020-11-06 (×6): 15 [IU] via SUBCUTANEOUS
  Filled 2020-11-05 (×7): qty 1

## 2020-11-05 NOTE — Progress Notes (Signed)
[place] [facility] PROGRESS NOTE    Karen [name]  [id number] DOB: 1945/04/02 DOA: 10/29/2020 PCP: Earlie Counts, FNP      Brief Narrative:  Karen [name] is a 75 y.o. F with COPD on 3L, dCHF, MO, DM on insulin pump, CKD IIIa baseline Cr 1.0 and hx bradycardia with BBs who presented with syncope, muscle aches, fatigue.  Recently developed flu like symptoms.  In the ER, found to have +COVID, CXR clear, hyperglycemic, lactate elevated.     Assessment & Plan:  COPD exacerbation Admitted with wheezing, cough, dyspnea. Improving -Continue steroids and Zithromax, day 5 of 5 - Continue BDs - Continue PPI - Continue Singulair - Continue ICs/LABA  COVID-19 asymptomatic Tested positive 8/26.  Symptoms (myalgias) started 2 days prior to that, now resolved.  Completed 5 days remdesivir.  Isolation can be d/c'd Sep 4.  AKI on CKD IIIa Resolved  Hypertension Blood pressure normal off meds - Hold losartan, HCTZ  Diabetes with steroid-induced hyperglycemia Glucoses remain elevated - Continue Lantus, increase dose - Increase mealtime aspart - Continue sliding scale corrections - Continue linagliptin - Continue statin - Hold Trulicity  Paroxysmal atrial fibrillation Heart rate controlled - Continue Eliquis  Pulmonary nodules Incidental finding - Follow up CT chest in 12 months  Obesity BMI 39   Anemia of chronic disease             Disposition: Status is: Inpatient  Remains inpatient appropriate because:Unsafe d/c plan  Dispo: The patient is from: Home              Anticipated d/c is to: SNF              Patient currently is not medically stable to d/c.   Difficult to place patient No       Level of care: Med-Surg       MDM: The below labs and imaging reports were reviewed and summarized above.  Medication management as above.    DVT prophylaxis: Place TED hose Start: 10/29/20 1650 apixaban (ELIQUIS) tablet 5 mg  Code  Status: FULL Family Communication:             Subjective: Wheezing is improved.  Still has some cough, no confusion, still generally weak, no sputum.  Objective: Vitals:   11/04/20 2230 11/05/20 0900 11/05/20 1215 11/05/20 1604  BP: 136/61 (!) 123/56 (!) 141/71 132/62  Pulse: 78 98 77 82  Resp: '17 20 20 18  '$ Temp: 97.9 F (36.6 C) 97.9 F (36.6 C) 97.9 F (36.6 C) 98.5 F (36.9 C)  TempSrc: Oral Oral    SpO2: 93% 91% 91% 93%  Weight:      Height:        Intake/Output Summary (Last 24 hours) at 11/05/2020 1631 Last data filed at 11/05/2020 1629 Gross per 24 hour  Intake --  Output 1600 ml  Net -1600 ml   Filed Weights   10/29/20 1234  Weight: 102 kg    Examination: General appearance: Adult female, sitting in the bed, interactive    HEENT:    Skin:  Cardiac: Rate normal, no murmurs, no lower extremity edema Respiratory: Normal respiratory rate and rhythm, lungs clear without rales or wheezes. Abdomen: v  MSK:  Neuro:   Psych:      Data Reviewed: I have personally reviewed following labs and imaging studies:  CBC: Recent Labs  Lab 10/30/20 0416 10/31/20 [id number] 11/01/20 0521 11/02/20 0426 11/03/20 0421 11/04/20 0317 11/05/20 0351  WBC 8.3  10.4 9.3 9.8 10.3 11.6* 12.8*  NEUTROABS 6.0 9.4* 6.6 7.0 7.5  --   --   HGB 9.0* 9.4* 8.8* 9.6* 9.4* 10.0* 9.6*  HCT 28.9* 30.1* 28.1* 31.0* 30.3* 32.0* 31.3*  MCV 84.3 81.6 82.6 83.6 82.3 83.1 81.7  PLT 252 255 261 259 278 279 [id number]   Basic Metabolic Panel: Recent Labs  Lab 10/31/20 0638 11/01/20 0521 11/02/20 0426 11/03/20 0421 11/04/20 0317 11/05/20 0351  NA 133* 134* 136 136 135 133*  K 4.6 3.7 4.2 3.8 4.7 4.6  CL 88* 92* 95* 94* 92* 93*  CO2 34* 35* 32 35* 36* 35*  GLUCOSE 325* 127* 132* 154* 251* 163*  BUN 37* 39* 36* 37* 39* 48*  CREATININE 0.94 0.92 1.00 1.02* 1.12* 1.10*  CALCIUM 9.5 9.0 9.0 8.8* 9.1 8.9  MG 2.0  --   --  1.9  --   --    GFR: Estimated Creatinine Clearance: 49.6 mL/min  (A) (by C-G formula based on SCr of 1.1 mg/dL (H)). Liver Function Tests: Recent Labs  Lab 10/30/20 0416 10/31/20 [id number] 11/01/20 0521 11/02/20 0426 11/03/20 0421  AST 16 39 21 16 12*  ALT '17 29 23 22 19  '$ ALKPHOS 64 71 68 69 67  BILITOT 0.7 0.8 0.7 0.7 0.8  PROT 6.8 7.2 6.2* 6.3* 6.5  ALBUMIN 3.4* 3.5 3.1* 3.2* 3.3*   No results for input(s): LIPASE, AMYLASE in the last 168 hours. No results for input(s): AMMONIA in the last 168 hours. Coagulation Profile: No results for input(s): INR, PROTIME in the last 168 hours. Cardiac Enzymes: No results for input(s): CKTOTAL, CKMB, CKMBINDEX, TROPONINI in the last 168 hours. BNP (last 3 results) No results for input(s): PROBNP in the last 8760 hours. HbA1C: No results for input(s): HGBA1C in the last 72 hours. CBG: Recent Labs  Lab 11/04/20 1701 11/04/20 2009 11/05/20 0903 11/05/20 1215 11/05/20 1604  GLUCAP 232* 228* 240* 181* 150*   Lipid Profile: No results for input(s): CHOL, HDL, LDLCALC, TRIG, CHOLHDL, LDLDIRECT in the last 72 hours. Thyroid Function Tests: No results for input(s): TSH, T4TOTAL, FREET4, T3FREE, THYROIDAB in the last 72 hours. Anemia Panel: No results for input(s): VITAMINB12, FOLATE, FERRITIN, TIBC, IRON, RETICCTPCT in the last 72 hours. Urine analysis:    Component Value Date/Time   COLORURINE YELLOW (A) 10/29/2020 1947   APPEARANCEUR HAZY (A) 10/29/2020 1947   APPEARANCEUR Cloudy (A) 09/28/2017 1338   LABSPEC 1.011 10/29/2020 1947   LABSPEC 1.024 04/02/2014 0517   PHURINE 5.0 10/29/2020 1947   GLUCOSEU NEGATIVE 10/29/2020 1947   GLUCOSEU >=500 04/02/2014 0517   HGBUR NEGATIVE 10/29/2020 1947   BILIRUBINUR NEGATIVE 10/29/2020 1947   BILIRUBINUR Negative 09/28/2017 [place] Negative 04/02/2014 [place] 10/29/2020 1947   PROTEINUR 30 (A) 10/29/2020 1947   NITRITE POSITIVE (A) 10/29/2020 1947   LEUKOCYTESUR SMALL (A) 10/29/2020 1947   LEUKOCYTESUR Trace 04/02/2014 0517    Sepsis Labs: '@LABRCNTIP'$ (procalcitonin:4,lacticacidven:4)  ) Recent Results (from the past 240 hour(s))  Blood culture (routine x 2)     Status: None   Collection Time: 10/29/20  2:25 PM   Specimen: BLOOD  Result Value Ref Range Status   Specimen Description BLOOD BLOOD LEFT HAND  Final   Special Requests   Final    BOTTLES DRAWN AEROBIC AND ANAEROBIC Blood Culture adequate volume   Culture   Final    NO GROWTH 5 DAYS Performed at [facility], [address] [place], [place]  [id number]    Report Status 11/03/2020 FINAL  Final  Resp Panel by RT-PCR (Flu A&B, Covid) Nasopharyngeal Swab     Status: Abnormal   Collection Time: 10/29/20  2:26 PM   Specimen: Nasopharyngeal Swab; Nasopharyngeal(NP) swabs in vial transport medium  Result Value Ref Range Status   SARS Coronavirus 2 by RT PCR POSITIVE (A) NEGATIVE Final    Comment: RESULT CALLED TO, READ BACK BY AND VERIFIED WITH: ROBIN REGISTER AT 1559 ON 10/29/20 BY SS (NOTE) SARS-CoV-2 target nucleic acids are DETECTED.  The SARS-CoV-2 RNA is generally detectable in upper respiratory specimens during the acute phase of infection. Positive results are indicative of the presence of the identified virus, but do not rule out bacterial infection or co-infection with other pathogens not detected by the test. Clinical correlation with patient history and other diagnostic information is necessary to determine patient infection status. The expected result is Negative.  Fact Sheet for Patients: EntrepreneurPulse.com.au  Fact Sheet for Healthcare Providers: IncredibleEmployment.be  This test is not yet approved or cleared by the [place] FDA and  has been authorized for detection and/or diagnosis of SARS-CoV-2 by FDA under an Emergency Use Authorization (EUA).  This EUA will remain in effect (meaning this test  can be used) for the duration of  the COVID-19 declaration under Section  564(b)(1) of the Act, 21 U.S.C. section 360bbb-3(b)(1), unless the authorization is terminated or revoked sooner.     Influenza A by PCR NEGATIVE NEGATIVE Final   Influenza B by PCR NEGATIVE NEGATIVE Final    Comment: (NOTE) The Xpert Xpress SARS-CoV-2/FLU/RSV plus assay is intended as an aid in the diagnosis of influenza from Nasopharyngeal swab specimens and should not be used as a sole basis for treatment. Nasal washings and aspirates are unacceptable for Xpert Xpress SARS-CoV-2/FLU/RSV testing.  Fact Sheet for Patients: EntrepreneurPulse.com.au  Fact Sheet for Healthcare Providers: IncredibleEmployment.be  This test is not yet approved or cleared by the [place] FDA and has been authorized for detection and/or diagnosis of SARS-CoV-2 by FDA under an Emergency Use Authorization (EUA). This EUA will remain in effect (meaning this test can be used) for the duration of the COVID-19 declaration under Section 564(b)(1) of the Act, 21 U.S.C. section 360bbb-3(b)(1), unless the authorization is terminated or revoked.  Performed at [facility], [address] [place], [place] [address]   Blood culture (routine x 2)     Status: None   Collection Time: 10/29/20  2:28 PM   Specimen: BLOOD  Result Value Ref Range Status   Specimen Description BLOOD BLOOD RIGHT HAND  Final   Special Requests   Final    BOTTLES DRAWN AEROBIC AND ANAEROBIC Blood Culture adequate volume   Culture   Final    NO GROWTH 5 DAYS Performed at [facility], [address], [place], [place] [address]    Report Status 11/03/2020 FINAL  Final         Radiology Studies: No results found.      Scheduled Meds:  apixaban  5 mg Oral BID   atorvastatin  10 mg Oral QHS   dextromethorphan-guaiFENesin  1 tablet Oral BID   diclofenac Sodium  2 g Topical QID   fluticasone furoate-vilanterol  1 puff Inhalation Daily   And   umeclidinium bromide   1 puff Inhalation Daily   insulin aspart  0-20 Units Subcutaneous TID WC   insulin aspart  0-5 Units Subcutaneous QHS   insulin aspart  15 Units Subcutaneous  TID WC   insulin glargine-yfgn  30 Units Subcutaneous BID   linagliptin  5 mg Oral Daily   methylPREDNISolone (SOLU-MEDROL) injection  40 mg Intravenous Q12H   montelukast  10 mg Oral QHS   pantoprazole  80 mg Oral Q1200   traZODone  50 mg Oral QHS   Continuous Infusions:  sodium chloride       LOS: 6 days    Time spent: 15 minutes    Edwin Dada, MD [facility] 11/05/2020, 4:31 PM     Please page though [place] or Epic secure chat:  For [employer], Adult nurse

## 2020-11-05 NOTE — TOC Progression Note (Signed)
Transition of Care [facility]) - Progression Note    Patient Details  Name: Karen [name] MRN: [id number] Date of Birth: [birth year]  Transition of Care [facility]) CM/SW [place], RN Phone Number: 11/05/2020, 2:38 PM  Clinical Narrative:   Patient will discharge to SNF when medically ready and COVID quarantine ends.  Auth in progress.  TOC to follow to discharge.    Expected Discharge Plan: Home/Self Care Barriers to Discharge: Continued Medical Work up  Expected Discharge Plan and Services Expected Discharge Plan: Home/Self Care       Living arrangements for the past 2 months: Single Family Home                 DME Arranged: N/A DME Agency: NA       HH Arranged: NA, Refused [place] Agency: NA         Social Determinants of Health (SDOH) Interventions    Readmission Risk Interventions Readmission Risk Prevention Plan 10/30/2020 06/21/2020 09/30/2019  Transportation Screening Complete Complete Complete  PCP or Specialist Appt within 5-7 Days - - -  PCP or Specialist Appt within 3-5 Days - - Complete  Home Care Screening - - -  Medication Review (RN CM) - - -  Social Work Scientific laboratory technician for [place] Planning/Counseling - - Complete  Palliative Care Screening - - Not Applicable  Medication Review Press photographer) Complete Complete Complete  PCP or Specialist appointment within 3-5 days of discharge Complete Complete -  PCP/Specialist Appt Not Complete comments - - -  HRI or Home Care Consult Patient refused Complete -  SW Recovery Care/Counseling Consult Complete Complete -  Palliative Care Screening Not Applicable Not Applicable -  [place] Not Applicable Not Applicable -  Some recent data might be hidden

## 2020-11-06 LAB — GLUCOSE, CAPILLARY
Glucose-Capillary: 156 mg/dL — ABNORMAL HIGH (ref 70–99)
Glucose-Capillary: 186 mg/dL — ABNORMAL HIGH (ref 70–99)
Glucose-Capillary: 143 mg/dL — ABNORMAL HIGH (ref 70–99)
Glucose-Capillary: 105 mg/dL — ABNORMAL HIGH (ref 70–99)

## 2020-11-06 MED ORDER — LOSARTAN POTASSIUM 50 MG PO TABS
50.0000 mg | ORAL_TABLET | Freq: Every day | ORAL | Status: DC
  Administered 2020-11-06 – 2020-11-08 (×3): 50 mg via ORAL
  Filled 2020-11-06 (×3): qty 1

## 2020-11-06 MED ORDER — FUROSEMIDE 40 MG PO TABS
40.0000 mg | ORAL_TABLET | Freq: Every day | ORAL | Status: DC
  Administered 2020-11-07 – 2020-11-09 (×3): 40 mg via ORAL
  Filled 2020-11-06 (×3): qty 1

## 2020-11-06 MED ORDER — INSULIN GLARGINE-YFGN 100 UNIT/ML ~~LOC~~ SOLN
25.0000 [IU] | Freq: Two times a day (BID) | SUBCUTANEOUS | Status: DC
  Administered 2020-11-06: 25 [IU] via SUBCUTANEOUS
  Filled 2020-11-06 (×3): qty 0.25

## 2020-11-06 NOTE — Progress Notes (Signed)
PT Cancellation Note  Patient Details Name: Karen [name] MRN: [id number] DOB: 04/16/1945   Cancelled Treatment:    Reason Eval/Treat Not Completed: Fatigue/lethargy limiting ability to participate Artist return to attempt treatment as planned. Pt asleep in dark room, awakens to voice, remains somnolent. Pt not agreeable to session at this time, cannot give reason when asked. Pt withdrawn. Will attempt again later date/time.)  3:57 PM, 11/06/20 [name], PT, DPT Physical Therapist - [facility]  [phone] ([place])    [place] C 11/06/2020, 3:57 PM

## 2020-11-06 NOTE — Progress Notes (Signed)
[place] [facility] PROGRESS NOTE    LYN KOMM  [id number] DOB: 05-31-1945 DOA: 10/29/2020 PCP: Earlie Counts, FNP      Brief Narrative:  Karen [name] is a 75 y.o. F with COPD on 3L, dCHF, MO, DM on insulin pump, CKD IIIa baseline Cr 1.0 and hx bradycardia with BBs who presented with syncope, muscle aches, fatigue.  Recently developed flu like symptoms.  In the ER, found to have +COVID, CXR clear, hyperglycemic, lactate elevated.     Assessment & Plan:  COPD exacerbation Admitted with wheezing, cough, dyspnea. Improving, completed 5 days steroids and azithromycin - Continue home ICS/LABA - Continue bronchodilators as needed  - Continue PPI - Continue Singulair  Asymptomatic COVID-19 Tested positive 8/26.  Symptoms (myalgias) started 2 days prior to that, now resolved.  Completed 5 days remdesivir.  Isolation can be d/c'd Sep 4.  AKI on CKD IIIa Resolved  Hypertension Blood pressure trending back up - Resume losartan - Hold HCTZ  Diabetes a steroid-induced hyperglycemia Glucose is better - Continue lantus and mealtime aspart - Continue sliding scale corrections - Continue linagliptin - Continue statin - Hold Trulicity  Paroxysmal atrial fibrillation Heart rate controlled - Continue Eliquis  Pulmonary nodules Incidental finding - Follow up CT chest in 12 months  Obesity BMI 39   Anemia of chronic disease             Disposition: Status is: Inpatient  Remains inpatient appropriate because:Unsafe d/c plan  Dispo: The patient is from: Home              Anticipated d/c is to: SNF              Patient currently is not medically stable to d/c.   Difficult to place patient No  Level of care: Med-Surg   The patient presented with COPD flare in the setting of COVID-19.  She is significantly weakened from her COPD flare and will require rehabilitation.  She will need to stay in isolation here until she can be accepted by her  facility for rehab       MDM: The below labs and imaging reports were reviewed and summarized above.  Medication management as above.    DVT prophylaxis: Place TED hose Start: 10/29/20 1650 apixaban (ELIQUIS) tablet 5 mg  Code Status: FULL Family Communication:             Subjective: Wheezing resolved, she has cough but this is improving, her malaise and fatigue are improving.  No vomiting, diarrhea, confusion.  Objective: Vitals:   11/05/20 1604 11/05/20 2049 11/06/20 0618 11/06/20 0807  BP: 132/62 (!) 146/60 (!) 152/80 (!) 144/64  Pulse: 82 77 86 67  Resp: '18 18 20 16  '$ Temp: 98.5 F (36.9 C) 98.1 F (36.7 C) 97.7 F (36.5 C) 97.6 F (36.4 C)  TempSrc:   Oral Oral  SpO2: 93% 93% 93% 96%  Weight:      Height:        Intake/Output Summary (Last 24 hours) at 11/06/2020 1207 Last data filed at 11/06/2020 1037 Gross per 24 hour  Intake --  Output 1800 ml  Net -1800 ml   Filed Weights   10/29/20 1234  Weight: 102 kg    Examination: General appearance: Adult female,, no acute distress  HEENT:    Skin:  Cardiac: Irregular, rate normal, no murmurs, no lower extremity edema Respiratory: Respiratory rate and rhythm normal, no rales or wheezes, good air movement Abdomen: Abdomen soft  no tenderness palpation or guarding, no ascites or distention MSK:  Neuro: Awake and alert, extraocular movements intact, moves all extremities generalized weakness but symmetric coordination, speech fluent Psych: Attention normal, affect normal, judgment insight appear normal    Data Reviewed: I have personally reviewed following labs and imaging studies:  CBC: Recent Labs  Lab 10/31/20 0638 11/01/20 0521 11/02/20 0426 11/03/20 0421 11/04/20 0317 11/05/20 0351  WBC 10.4 9.3 9.8 10.3 11.6* 12.8*  NEUTROABS 9.4* 6.6 7.0 7.5  --   --   HGB 9.4* 8.8* 9.6* 9.4* 10.0* 9.6*  HCT 30.1* 28.1* 31.0* 30.3* 32.0* 31.3*  MCV 81.6 82.6 83.6 82.3 83.1 81.7  PLT 255 261 259 278  279 [id number]   Basic Metabolic Panel: Recent Labs  Lab 10/31/20 0638 11/01/20 0521 11/02/20 0426 11/03/20 0421 11/04/20 0317 11/05/20 0351  NA 133* 134* 136 136 135 133*  K 4.6 3.7 4.2 3.8 4.7 4.6  CL 88* 92* 95* 94* 92* 93*  CO2 34* 35* 32 35* 36* 35*  GLUCOSE 325* 127* 132* 154* 251* 163*  BUN 37* 39* 36* 37* 39* 48*  CREATININE 0.94 0.92 1.00 1.02* 1.12* 1.10*  CALCIUM 9.5 9.0 9.0 8.8* 9.1 8.9  MG 2.0  --   --  1.9  --   --    GFR: Estimated Creatinine Clearance: 49.6 mL/min (A) (by C-G formula based on SCr of 1.1 mg/dL (H)). Liver Function Tests: Recent Labs  Lab 10/31/20 [id number] 11/01/20 0521 11/02/20 0426 11/03/20 0421  AST 39 21 16 12*  ALT '29 23 22 19  '$ ALKPHOS 71 68 69 67  BILITOT 0.8 0.7 0.7 0.8  PROT 7.2 6.2* 6.3* 6.5  ALBUMIN 3.5 3.1* 3.2* 3.3*   No results for input(s): LIPASE, AMYLASE in the last 168 hours. No results for input(s): AMMONIA in the last 168 hours. Coagulation Profile: No results for input(s): INR, PROTIME in the last 168 hours. Cardiac Enzymes: No results for input(s): CKTOTAL, CKMB, CKMBINDEX, TROPONINI in the last 168 hours. BNP (last 3 results) No results for input(s): PROBNP in the last 8760 hours. HbA1C: No results for input(s): HGBA1C in the last 72 hours. CBG: Recent Labs  Lab 11/05/20 0903 11/05/20 1215 11/05/20 1604 11/05/20 2049 11/06/20 0809  GLUCAP 240* 181* 150* 209* 156*   Lipid Profile: No results for input(s): CHOL, HDL, LDLCALC, TRIG, CHOLHDL, LDLDIRECT in the last 72 hours. Thyroid Function Tests: No results for input(s): TSH, T4TOTAL, FREET4, T3FREE, THYROIDAB in the last 72 hours. Anemia Panel: No results for input(s): VITAMINB12, FOLATE, FERRITIN, TIBC, IRON, RETICCTPCT in the last 72 hours. Urine analysis:    Component Value Date/Time   COLORURINE YELLOW (A) 10/29/2020 1947   APPEARANCEUR HAZY (A) 10/29/2020 1947   APPEARANCEUR Cloudy (A) 09/28/2017 1338   LABSPEC 1.011 10/29/2020 1947   LABSPEC 1.024  04/02/2014 0517   PHURINE 5.0 10/29/2020 1947   GLUCOSEU NEGATIVE 10/29/2020 1947   GLUCOSEU >=500 04/02/2014 0517   HGBUR NEGATIVE 10/29/2020 [place] 10/29/2020 1947   [place] Negative 09/28/2017 [place] Negative 04/02/2014 [place] 10/29/2020 1947   PROTEINUR 30 (A) 10/29/2020 1947   NITRITE POSITIVE (A) 10/29/2020 1947   LEUKOCYTESUR SMALL (A) 10/29/2020 1947   LEUKOCYTESUR Trace 04/02/2014 0517   Sepsis Labs: '@LABRCNTIP'$ (procalcitonin:4,lacticacidven:4)  ) Recent Results (from the past 240 hour(s))  Blood culture (routine x 2)     Status: None   Collection Time: 10/29/20  2:25 PM   Specimen: BLOOD  Result Value Ref Range Status   Specimen Description BLOOD BLOOD LEFT HAND  Final   Special Requests   Final    BOTTLES DRAWN AEROBIC AND ANAEROBIC Blood Culture adequate volume   Culture   Final    NO GROWTH 5 DAYS Performed at [facility], [address] [place], [place] [address]    Report Status 11/03/2020 FINAL  Final  Resp Panel by RT-PCR (Flu A&B, Covid) Nasopharyngeal Swab     Status: Abnormal   Collection Time: 10/29/20  2:26 PM   Specimen: Nasopharyngeal Swab; Nasopharyngeal(NP) swabs in vial transport medium  Result Value Ref Range Status   SARS Coronavirus 2 by RT PCR POSITIVE (A) NEGATIVE Final    Comment: RESULT CALLED TO, READ BACK BY AND VERIFIED WITH: ROBIN REGISTER AT 1559 ON 10/29/20 BY SS (NOTE) SARS-CoV-2 target nucleic acids are DETECTED.  The SARS-CoV-2 RNA is generally detectable in upper respiratory specimens during the acute phase of infection. Positive results are indicative of the presence of the identified virus, but do not rule out bacterial infection or co-infection with other pathogens not detected by the test. Clinical correlation with patient history and other diagnostic information is necessary to determine patient infection status. The expected result is Negative.  Fact  Sheet for Patients: EntrepreneurPulse.com.au  Fact Sheet for Healthcare Providers: IncredibleEmployment.be  This test is not yet approved or cleared by the [place] FDA and  has been authorized for detection and/or diagnosis of SARS-CoV-2 by FDA under an Emergency Use Authorization (EUA).  This EUA will remain in effect (meaning this test  can be used) for the duration of  the COVID-19 declaration under Section 564(b)(1) of the Act, 21 U.S.C. section 360bbb-3(b)(1), unless the authorization is terminated or revoked sooner.     Influenza A by PCR NEGATIVE NEGATIVE Final   Influenza B by PCR NEGATIVE NEGATIVE Final    Comment: (NOTE) The Xpert Xpress SARS-CoV-2/FLU/RSV plus assay is intended as an aid in the diagnosis of influenza from Nasopharyngeal swab specimens and should not be used as a sole basis for treatment. Nasal washings and aspirates are unacceptable for Xpert Xpress SARS-CoV-2/FLU/RSV testing.  Fact Sheet for Patients: EntrepreneurPulse.com.au  Fact Sheet for Healthcare Providers: IncredibleEmployment.be  This test is not yet approved or cleared by the [place] FDA and has been authorized for detection and/or diagnosis of SARS-CoV-2 by FDA under an Emergency Use Authorization (EUA). This EUA will remain in effect (meaning this test can be used) for the duration of the COVID-19 declaration under Section 564(b)(1) of the Act, 21 U.S.C. section 360bbb-3(b)(1), unless the authorization is terminated or revoked.  Performed at [facility], [address] [place], [place] [address]   Blood culture (routine x 2)     Status: None   Collection Time: 10/29/20  2:28 PM   Specimen: BLOOD  Result Value Ref Range Status   Specimen Description BLOOD BLOOD RIGHT HAND  Final   Special Requests   Final    BOTTLES DRAWN AEROBIC AND ANAEROBIC Blood Culture adequate volume   Culture    Final    NO GROWTH 5 DAYS Performed at [facility], [address] [place], [place] [address]    Report Status 11/03/2020 FINAL  Final         Radiology Studies: No results found.      Scheduled Meds:  apixaban  5 mg Oral BID   atorvastatin  10 mg Oral QHS   dextromethorphan-guaiFENesin  1 tablet Oral  BID   diclofenac Sodium  2 g Topical QID   fluticasone furoate-vilanterol  1 puff Inhalation Daily   And   umeclidinium bromide  1 puff Inhalation Daily   insulin aspart  0-20 Units Subcutaneous TID WC   insulin aspart  0-5 Units Subcutaneous QHS   insulin aspart  15 Units Subcutaneous TID WC   insulin glargine-yfgn  30 Units Subcutaneous BID   linagliptin  5 mg Oral Daily   montelukast  10 mg Oral QHS   pantoprazole  80 mg Oral Q1200   traZODone  50 mg Oral QHS   Continuous Infusions:  sodium chloride       LOS: 7 days    Time spent: 25  minutes    Edwin Dada, MD [facility] 11/06/2020, 12:07 PM     Please page though [place] or Epic secure chat:  For [employer], Adult nurse

## 2020-11-06 NOTE — Progress Notes (Signed)
PT Cancellation Note  Patient Details Name: Karen [name] MRN: [id number] DOB: 1946-02-09   Cancelled Treatment:    Reason Eval/Treat Not Completed: Patient at procedure or test/unavailable (Chart reviewed, treatment attempted.  Patient seated at EOB with meal tray present, just starting to eat.  Patient asks for P[place]or Curia to return at a later date/time so she can eat.)  2:15 PM, 11/06/20 [name], PT, DPT Physical Therapist - [facility]  [phone] ([place])    [place] C 11/06/2020, 2:15 PM

## 2020-11-07 LAB — GLUCOSE, CAPILLARY
Glucose-Capillary: 84 mg/dL (ref 70–99)
Glucose-Capillary: 191 mg/dL — ABNORMAL HIGH (ref 70–99)
Glucose-Capillary: 181 mg/dL — ABNORMAL HIGH (ref 70–99)
Glucose-Capillary: 155 mg/dL — ABNORMAL HIGH (ref 70–99)

## 2020-11-07 MED ORDER — INSULIN ASPART 100 UNIT/ML IJ SOLN
2.0000 [IU] | Freq: Once | INTRAMUSCULAR | Status: AC
  Administered 2020-11-07: 13:00:00 2 [IU] via SUBCUTANEOUS

## 2020-11-07 MED ORDER — INSULIN GLARGINE-YFGN 100 UNIT/ML ~~LOC~~ SOLN
18.0000 [IU] | Freq: Two times a day (BID) | SUBCUTANEOUS | Status: DC
  Administered 2020-11-07 – 2020-11-09 (×5): 18 [IU] via SUBCUTANEOUS
  Filled 2020-11-07 (×6): qty 0.18

## 2020-11-07 MED ORDER — INSULIN ASPART 100 UNIT/ML IJ SOLN
10.0000 [IU] | Freq: Three times a day (TID) | INTRAMUSCULAR | Status: DC
  Administered 2020-11-07 – 2020-11-09 (×6): 10 [IU] via SUBCUTANEOUS
  Filled 2020-11-07 (×7): qty 1

## 2020-11-07 NOTE — Progress Notes (Signed)
[place] [facility] PROGRESS NOTE    Karen [name]  [id number] DOB: 1945-03-11 DOA: 10/29/2020 PCP: Karen Counts, FNP      Brief Narrative:  Karen [name] is a 75 y.o. F with COPD on 3L, dCHF, MO, DM on insulin pump, CKD IIIa baseline Cr 1.0 and hx bradycardia with BBs who presented with syncope, muscle aches, fatigue.  Recently developed flu like symptoms.  In the ER, found to have +COVID, CXR clear, hyperglycemic, lactate elevated.     Assessment & Plan:  COPD exacerbation Admitted with wheezing, cough, dyspnea. Improving, completed 5 days steroids and azithromycin - Continue home ICS/LABA - Continue bronchodilators as needed  - Continue PPI - Continue Singulair  Asymptomatic COVID-19 Tested positive 8/26.  Symptoms (myalgias) started 2 days prior to that, now resolved.  Completed 5 days remdesivir.  Isolation can be d/c'd tomorrow morning.  AKI on CKD IIIa Resolved  Hypertension Blood pressure normal - Continue losartan - Hold HCTZ  Diabetes a steroid-induced hyperglycemia Glucose trending low - Continue lantus, reduce dose -Continue mealtime aspart and reduce dose - Continue sliding scale corrections - Continue linagliptin - Continue statin - Hold Trulicity  Paroxysmal atrial fibrillation Heart rate controlled - Continue Eliquis  Pulmonary nodules Incidental finding - Follow up CT chest in 12 months  Obesity BMI 39   Anemia of chronic disease             Disposition: Status is: Inpatient  Remains inpatient appropriate because:Unsafe d/c plan  Dispo: The patient is from: Home              Anticipated d/c is to: SNF              Patient currently is not medically stable to d/c.   Difficult to place patient No  Level of care: Med-Surg   The patient presented with COPD flare in the setting of COVID-19.  She is significantly weakened from her COPD flare and will require rehabilitation.  She will need to stay in  isolation here until she can be accepted by her facility for rehab       MDM: The below labs and imaging reports were reviewed and summarized above.  Medication management as above.    DVT prophylaxis: Place TED hose Start: 10/29/20 1650 apixaban (ELIQUIS) tablet 5 mg  Code Status: FULL Family Communication:             Subjective: Wheezing resolved, she has cough but this is improving, her malaise and fatigue are improving.  No vomiting, diarrhea, confusion.  Objective: Vitals:   11/06/20 2144 11/07/20 0616 11/07/20 0815 11/07/20 1150  BP: (!) 102/59 (!) 121/56 (!) 135/56 (!) 117/54  Pulse: 68 73 82 68  Resp: '18 18 20 19  '$ Temp: 97.6 F (36.4 C) 97.8 F (36.6 C) (!) 97.4 F (36.3 C) 98.4 F (36.9 C)  TempSrc: Oral Oral  Oral  SpO2: 91% 93% 91% 96%  Weight:      Height:        Intake/Output Summary (Last 24 hours) at 11/07/2020 1214 Last data filed at 11/07/2020 1033 Gross per 24 hour  Intake --  Output 800 ml  Net -800 ml   Filed Weights   10/29/20 1234  Weight: 102 kg    Examination: General appearance: Adult female,, no acute distress  HEENT:    Skin:  Cardiac: Irregular, rate normal, no murmurs, no lower extremity edema Respiratory: Respiratory rate and rhythm normal, no rales or wheezes, good  air movement Abdomen: Abdomen soft no tenderness palpation or guarding, no ascites or distention MSK:  Neuro: Awake and alert, extraocular movements intact, moves all extremities generalized weakness but symmetric coordination, speech fluent Psych: Attention normal, affect normal, judgment insight appear normal    Data Reviewed: I have personally reviewed following labs and imaging studies:  CBC: Recent Labs  Lab 11/01/20 0521 11/02/20 0426 11/03/20 0421 11/04/20 0317 11/05/20 0351  WBC 9.3 9.8 10.3 11.6* 12.8*  NEUTROABS 6.6 7.0 7.5  --   --   HGB 8.8* 9.6* 9.4* 10.0* 9.6*  HCT 28.1* 31.0* 30.3* 32.0* 31.3*  MCV 82.6 83.6 82.3 83.1 81.7  PLT  261 259 278 279 [id number]   Basic Metabolic Panel: Recent Labs  Lab 11/01/20 0521 11/02/20 0426 11/03/20 0421 11/04/20 0317 11/05/20 0351  NA 134* 136 136 135 133*  K 3.7 4.2 3.8 4.7 4.6  CL 92* 95* 94* 92* 93*  CO2 35* 32 35* 36* 35*  GLUCOSE 127* 132* 154* 251* 163*  BUN 39* 36* 37* 39* 48*  CREATININE 0.92 1.00 1.02* 1.12* 1.10*  CALCIUM 9.0 9.0 8.8* 9.1 8.9  MG  --   --  1.9  --   --    GFR: Estimated Creatinine Clearance: 49.6 mL/min (A) (by C-G formula based on SCr of 1.1 mg/dL (H)). Liver Function Tests: Recent Labs  Lab 11/01/20 0521 11/02/20 0426 11/03/20 0421  AST 21 16 12*  ALT '23 22 19  '$ ALKPHOS 68 69 67  BILITOT 0.7 0.7 0.8  PROT 6.2* 6.3* 6.5  ALBUMIN 3.1* 3.2* 3.3*   No results for input(s): LIPASE, AMYLASE in the last 168 hours. No results for input(s): AMMONIA in the last 168 hours. Coagulation Profile: No results for input(s): INR, PROTIME in the last 168 hours. Cardiac Enzymes: No results for input(s): CKTOTAL, CKMB, CKMBINDEX, TROPONINI in the last 168 hours. BNP (last 3 results) No results for input(s): PROBNP in the last 8760 hours. HbA1C: No results for input(s): HGBA1C in the last 72 hours. CBG: Recent Labs  Lab 11/06/20 1211 11/06/20 1644 11/06/20 2143 11/07/20 0811 11/07/20 1148  GLUCAP 186* 143* 105* 84 191*   Lipid Profile: No results for input(s): CHOL, HDL, LDLCALC, TRIG, CHOLHDL, LDLDIRECT in the last 72 hours. Thyroid Function Tests: No results for input(s): TSH, T4TOTAL, FREET4, T3FREE, THYROIDAB in the last 72 hours. Anemia Panel: No results for input(s): VITAMINB12, FOLATE, FERRITIN, TIBC, IRON, RETICCTPCT in the last 72 hours. Urine analysis:    Component Value Date/Time   COLORURINE YELLOW (A) 10/29/2020 1947   APPEARANCEUR HAZY (A) 10/29/2020 1947   APPEARANCEUR Cloudy (A) 09/28/2017 1338   LABSPEC 1.011 10/29/2020 1947   LABSPEC 1.024 04/02/2014 0517   PHURINE 5.0 10/29/2020 1947   GLUCOSEU NEGATIVE 10/29/2020 1947    GLUCOSEU >=500 04/02/2014 0517   HGBUR NEGATIVE 10/29/2020 1947   BILIRUBINUR NEGATIVE 10/29/2020 1947   [place] Negative 09/28/2017 [place] Negative 04/02/2014 [place] 10/29/2020 1947   PROTEINUR 30 (A) 10/29/2020 1947   NITRITE POSITIVE (A) 10/29/2020 1947   LEUKOCYTESUR SMALL (A) 10/29/2020 1947   LEUKOCYTESUR Trace 04/02/2014 0517   Sepsis Labs: '@LABRCNTIP'$ (procalcitonin:4,lacticacidven:4)  ) Recent Results (from the past 240 hour(s))  Blood culture (routine x 2)     Status: None   Collection Time: 10/29/20  2:25 PM   Specimen: BLOOD  Result Value Ref Range Status   Specimen Description BLOOD BLOOD LEFT HAND  Final   Special Requests   Final  BOTTLES DRAWN AEROBIC AND ANAEROBIC Blood Culture adequate volume   Culture   Final    NO GROWTH 5 DAYS Performed at [facility], [address] [place], [place] [address]    Report Status 11/03/2020 FINAL  Final  Resp Panel by RT-PCR (Flu A&B, Covid) Nasopharyngeal Swab     Status: Abnormal   Collection Time: 10/29/20  2:26 PM   Specimen: Nasopharyngeal Swab; Nasopharyngeal(NP) swabs in vial transport medium  Result Value Ref Range Status   SARS Coronavirus 2 by RT PCR POSITIVE (A) NEGATIVE Final    Comment: RESULT CALLED TO, READ BACK BY AND VERIFIED WITH: ROBIN REGISTER AT 1559 ON 10/29/20 BY SS (NOTE) SARS-CoV-2 target nucleic acids are DETECTED.  The SARS-CoV-2 RNA is generally detectable in upper respiratory specimens during the acute phase of infection. Positive results are indicative of the presence of the identified virus, but do not rule out bacterial infection or co-infection with other pathogens not detected by the test. Clinical correlation with patient history and other diagnostic information is necessary to determine patient infection status. The expected result is Negative.  Fact Sheet for Patients: EntrepreneurPulse.com.au  Fact Sheet for  Healthcare Providers: IncredibleEmployment.be  This test is not yet approved or cleared by the [place] FDA and  has been authorized for detection and/or diagnosis of SARS-CoV-2 by FDA under an Emergency Use Authorization (EUA).  This EUA will remain in effect (meaning this test  can be used) for the duration of  the COVID-19 declaration under Section 564(b)(1) of the Act, 21 U.S.C. section 360bbb-3(b)(1), unless the authorization is terminated or revoked sooner.     Influenza A by PCR NEGATIVE NEGATIVE Final   Influenza B by PCR NEGATIVE NEGATIVE Final    Comment: (NOTE) The Xpert Xpress SARS-CoV-2/FLU/RSV plus assay is intended as an aid in the diagnosis of influenza from Nasopharyngeal swab specimens and should not be used as a sole basis for treatment. Nasal washings and aspirates are unacceptable for Xpert Xpress SARS-CoV-2/FLU/RSV testing.  Fact Sheet for Patients: EntrepreneurPulse.com.au  Fact Sheet for Healthcare Providers: IncredibleEmployment.be  This test is not yet approved or cleared by the [place] FDA and has been authorized for detection and/or diagnosis of SARS-CoV-2 by FDA under an Emergency Use Authorization (EUA). This EUA will remain in effect (meaning this test can be used) for the duration of the COVID-19 declaration under Section 564(b)(1) of the Act, 21 U.S.C. section 360bbb-3(b)(1), unless the authorization is terminated or revoked.  Performed at [facility], [address] [place], [place] [address]   Blood culture (routine x 2)     Status: None   Collection Time: 10/29/20  2:28 PM   Specimen: BLOOD  Result Value Ref Range Status   Specimen Description BLOOD BLOOD RIGHT HAND  Final   Special Requests   Final    BOTTLES DRAWN AEROBIC AND ANAEROBIC Blood Culture adequate volume   Culture   Final    NO GROWTH 5 DAYS Performed at [facility], [address], [place], [place] [address]    Report Status 11/03/2020 FINAL  Final         Radiology Studies: No results found.      Scheduled Meds:  apixaban  5 mg Oral BID   atorvastatin  10 mg Oral QHS   dextromethorphan-guaiFENesin  1 tablet Oral BID   diclofenac Sodium  2 g Topical QID   fluticasone furoate-vilanterol  1 puff Inhalation Daily   And   umeclidinium  bromide  1 puff Inhalation Daily   furosemide  40 mg Oral Daily   insulin aspart  0-20 Units Subcutaneous TID WC   insulin aspart  0-5 Units Subcutaneous QHS   insulin aspart  10 Units Subcutaneous TID WC   insulin glargine-yfgn  18 Units Subcutaneous BID   linagliptin  5 mg Oral Daily   losartan  50 mg Oral Daily   montelukast  10 mg Oral QHS   pantoprazole  80 mg Oral Q1200   traZODone  50 mg Oral QHS   Continuous Infusions:  sodium chloride       LOS: 8 days    Time spent: 25  minutes    Edwin Dada, MD [facility] 11/07/2020, 12:14 PM     Please page though [employer] or Epic secure chat:  For [employer], Adult nurse

## 2020-11-08 LAB — GLUCOSE, CAPILLARY
Glucose-Capillary: 131 mg/dL — ABNORMAL HIGH (ref 70–99)
Glucose-Capillary: 142 mg/dL — ABNORMAL HIGH (ref 70–99)
Glucose-Capillary: 109 mg/dL — ABNORMAL HIGH (ref 70–99)
Glucose-Capillary: 215 mg/dL — ABNORMAL HIGH (ref 70–99)

## 2020-11-08 NOTE — Progress Notes (Signed)
[place] [facility] PROGRESS NOTE    Karen [name]  [id number] DOB: 1946/01/29 DOA: 10/29/2020 PCP: Karen Counts, FNP      Brief Narrative:  Mrs. Karen [name] is a 75 y.o. F with COPD on 3L, dCHF, MO, DM on insulin pump, CKD IIIa baseline Cr 1.0 and hx bradycardia with BBs who presented with syncope, muscle aches, fatigue.  Recently developed flu like symptoms.  In the ER, found to have +COVID, CXR clear, hyperglycemic, lactate elevated.     Assessment & Plan:  COPD exacerbation Admitted with wheezing, cough, dyspnea.  Started on steroids, antibioitcs, symptoms resolved to baseline.  Completed 5 days steroids and azithromycin  -Continue home ICS/LABA - Continue bronchodilators as needed - Continue PPI and Singulair   Asymptomatic COVID-19 Tested positive 8/26.  Symptoms (myalgias) started 2 days prior to that, now resolved.  Completed 5 days remdesivir.  Isolation can be d/c'd tomorrow morning.  AKI on CKD IIIa Resolved  Hypertension Blood pressure low normal - Continue furosemide - Hold HCTZ and losartan  Diabetes with steroid-induced hyperglycemia Glucose controlled now - Continue Lantus twice daily - Continue mealtime aspart - Continue sliding scale corrections - Continue linagliptin - Continue statin - Hold Trulicity  Paroxysmal atrial fibrillation Heart rate controlled - Continue Eliquis  Pulmonary nodules Incidental finding - Follow up CT chest in 12 months  Obesity BMI 39   Anemia of chronic disease             Disposition: Status is: Inpatient  Remains inpatient appropriate because:Unsafe d/c plan  Dispo: The patient is from: Home              Anticipated d/c is to: SNF              Patient currently is not medically stable to d/c.   Difficult to place patient No  Level of care: Med-Surg   The patient presented with COPD flare in the setting of COVID-19.  She is significantly weakened from her COPD flare and will  require rehabilitation.  She will need to stay in isolation here until she can be accepted by her facility for rehab       MDM: The below labs and imaging reports were reviewed and summarized above.  Medication management as above.    DVT prophylaxis: Place TED hose Start: 10/29/20 1650 apixaban (ELIQUIS) tablet 5 mg  Code Status: FULL Family Communication:             Subjective: Wheezing is improved, cough is improved, fatigue and malaise are resolved.  She feels well.  She is tired and somewhat weak.  Objective: Vitals:   11/07/20 2143 11/08/20 0437 11/08/20 0828 11/08/20 1205  BP: (!) 121/57 (!) 109/48 (!) 123/52 (!) 120/53  Pulse: 70 91 82 85  Resp: '20 13 18 16  '$ Temp:  97.7 F (36.5 C) 98.2 F (36.8 C) 98 F (36.7 C)  TempSrc: Oral Oral Oral Oral  SpO2: 93% 94% 94% 93%  Weight:      Height:        Intake/Output Summary (Last 24 hours) at 11/08/2020 1504 Last data filed at 11/08/2020 1205 Gross per 24 hour  Intake --  Output 1300 ml  Net -1300 ml   Filed Weights   10/29/20 1234  Weight: 102 kg    Examination: General appearance: Adult female, sitting on the edge of the bed, no acute distress     HEENT:    Skin:  Cardiac: RRR, no murmurs,  no lower extremity edema Respiratory: Normal respiratory rate and rhythm, lungs clear without rales or wheezes Abdomen: Abdomen soft no tenderness palpation or guarding MSK:  Neuro: Awake and alert, extraocular movements intact, moves all extremities with generalized weakness but symmetric coordination, speech fluent Psych: Attention normal, affect normal, judgment and insight appear normal      Data Reviewed: I have personally reviewed following labs and imaging studies:  CBC: Recent Labs  Lab 11/02/20 0426 11/03/20 0421 11/04/20 0317 11/05/20 0351  WBC 9.8 10.3 11.6* 12.8*  NEUTROABS 7.0 7.5  --   --   HGB 9.6* 9.4* 10.0* 9.6*  HCT 31.0* 30.3* 32.0* 31.3*  MCV 83.6 82.3 83.1 81.7  PLT 259 278 279  [id number]   Basic Metabolic Panel: Recent Labs  Lab 11/02/20 0426 11/03/20 0421 11/04/20 0317 11/05/20 0351  NA 136 136 135 133*  K 4.2 3.8 4.7 4.6  CL 95* 94* 92* 93*  CO2 32 35* 36* 35*  GLUCOSE 132* 154* 251* 163*  BUN 36* 37* 39* 48*  CREATININE 1.00 1.02* 1.12* 1.10*  CALCIUM 9.0 8.8* 9.1 8.9  MG  --  1.9  --   --    GFR: Estimated Creatinine Clearance: 49.6 mL/min (A) (by C-G formula based on SCr of 1.1 mg/dL (H)). Liver Function Tests: Recent Labs  Lab 11/02/20 0426 11/03/20 0421  AST 16 12*  ALT 22 19  ALKPHOS 69 67  BILITOT 0.7 0.8  PROT 6.3* 6.5  ALBUMIN 3.2* 3.3*   No results for input(s): LIPASE, AMYLASE in the last 168 hours. No results for input(s): AMMONIA in the last 168 hours. Coagulation Profile: No results for input(s): INR, PROTIME in the last 168 hours. Cardiac Enzymes: No results for input(s): CKTOTAL, CKMB, CKMBINDEX, TROPONINI in the last 168 hours. BNP (last 3 results) No results for input(s): PROBNP in the last 8760 hours. HbA1C: No results for input(s): HGBA1C in the last 72 hours. CBG: Recent Labs  Lab 11/07/20 1148 11/07/20 1657 11/07/20 2147 11/08/20 0824 11/08/20 1204  GLUCAP 191* 181* 155* 131* 142*   Lipid Profile: No results for input(s): CHOL, HDL, LDLCALC, TRIG, CHOLHDL, LDLDIRECT in the last 72 hours. Thyroid Function Tests: No results for input(s): TSH, T4TOTAL, FREET4, T3FREE, THYROIDAB in the last 72 hours. Anemia Panel: No results for input(s): VITAMINB12, FOLATE, FERRITIN, TIBC, IRON, RETICCTPCT in the last 72 hours. Urine analysis:    Component Value Date/Time   COLORURINE YELLOW (A) 10/29/2020 1947   APPEARANCEUR HAZY (A) 10/29/2020 1947   APPEARANCEUR Cloudy (A) 09/28/2017 1338   LABSPEC 1.011 10/29/2020 1947   LABSPEC 1.024 04/02/2014 0517   PHURINE 5.0 10/29/2020 1947   GLUCOSEU NEGATIVE 10/29/2020 1947   GLUCOSEU >=500 04/02/2014 0517   HGBUR NEGATIVE 10/29/2020 [place] 10/29/2020 1947    [place] Negative 09/28/2017 1338   BILIRUBINUR Negative 04/02/2014 [place] 10/29/2020 1947   PROTEINUR 30 (A) 10/29/2020 1947   NITRITE POSITIVE (A) 10/29/2020 1947   LEUKOCYTESUR SMALL (A) 10/29/2020 1947   LEUKOCYTESUR Trace 04/02/2014 0517   Sepsis Labs: '@LABRCNTIP'$ (procalcitonin:4,lacticacidven:4)  ) No results found for this or any previous visit (from the past 240 hour(s)).        Radiology Studies: No results found.      Scheduled Meds:  apixaban  5 mg Oral BID   atorvastatin  10 mg Oral QHS   dextromethorphan-guaiFENesin  1 tablet Oral BID   diclofenac Sodium  2 g Topical QID   fluticasone furoate-vilanterol  1 puff Inhalation Daily   And   umeclidinium bromide  1 puff Inhalation Daily   furosemide  40 mg Oral Daily   insulin aspart  0-20 Units Subcutaneous TID WC   insulin aspart  0-5 Units Subcutaneous QHS   insulin aspart  10 Units Subcutaneous TID WC   insulin glargine-yfgn  18 Units Subcutaneous BID   linagliptin  5 mg Oral Daily   losartan  50 mg Oral Daily   montelukast  10 mg Oral QHS   pantoprazole  80 mg Oral Q1200   traZODone  50 mg Oral QHS   Continuous Infusions:  sodium chloride       LOS: 9 days    Time spent: 25  minutes    Edwin Dada, MD [facility] 11/08/2020, 3:04 PM     Please page though [place] or Epic secure chat:  For [employer], Adult nurse

## 2020-11-09 LAB — GLUCOSE, CAPILLARY: Glucose-Capillary: 139 mg/dL — ABNORMAL HIGH (ref 70–99)

## 2020-11-09 MED ORDER — LANTUS SOLOSTAR 100 UNIT/ML ~~LOC~~ SOPN: 38.0000 [IU] | PEN_INJECTOR | Freq: Every day | SUBCUTANEOUS | 11 refills | Status: DC

## 2020-11-09 MED ORDER — GUAIFENESIN-DM 100-10 MG/5ML PO SYRP: 10.0000 mL | ORAL_SOLUTION | ORAL | 0 refills | Status: DC | PRN

## 2020-11-09 MED ORDER — OXYCODONE HCL 5 MG PO TABS: 5.0000 mg | ORAL_TABLET | ORAL | 0 refills | Status: DC | PRN

## 2020-11-09 NOTE — Progress Notes (Signed)
Patient discharged with EMS at 1213. Report as well as packet given to EMS. Attempted to call SNF with report with no answer and no option to leave a voicemail.

## 2020-11-09 NOTE — Discharge Summary (Addendum)
Physician Discharge Summary  Karen [name] [id number] DOB: [birth year] DOA: 10/29/2020  PCP: Karen Counts, FNP  Admit date: 10/29/2020 Discharge date: 11/09/2020  Admitted From: Home  Disposition:  S[facility]   Recommendations for Outpatient Follow-up:  Follow up with Karen [name] PCP in 1 week after discharge from SNF Please monitor glucose TID AC and add sliding scale if needed   Karen [name]: Please obtain CT chest in 12 months for pulmonary nodules      Home Health: N/A  Equipment/Devices: TBD at SNF  Discharge Condition: Good  CODE STATUS: FULL Diet recommendation: Diabetic  Brief/Interim Summary: Karen [name] is a 75 y.o. F with COPD on 3L, dCHF, MO, DM on insulin pump, CKD IIIa baseline Cr 1.0 and hx bradycardia with BBs who presented with syncope, muscle aches, fatigue.   Recently developed flu like symptoms.  In the ER, found to have +COVID, CXR clear, hyperglycemic, lactate elevated.     PRINCIPAL HOSPITAL DIAGNOSIS: COPD exacerbation    Discharge Diagnoses:  COPD exacerbation Admitted with wheezing, cough, dyspnea.   Started on steroids, antibioitcs, symptoms resolved to baseline.  Completed 5 days steroids and azithromycin      Asymptomatic COVID-19 Tested positive 8/26.  Symptoms (myalgias) started 2 days prior to that, now resolved.  Completed 5 days remdesivir.     AKI on CKD IIIa Resolved   Hypertension Blood pressure low normal.  Losartan stopped at d/c.   Diabetes with steroid-induced hyperglycemia Glucose difficult to control with steroids.    After steroids, able to maintain on lower than home doses of insulin.    Resume home antidiabetic regimen and titrate insulin as needed at SNF.   Paroxysmal atrial fibrillation Continue Eliquis   Pulmonary nodules Incidental finding - Follow up CT chest in 12 months   Obesity BMI 39   Anemia of chronic disease                 Discharge Instructions  Discharge Instructions      Diet - low sodium heart healthy   Complete by: As directed    Discharge instructions   Complete by: As directed    You were admitted for a COPD flare. You were treated with steroids and antibiotics and got better. Your COVID was mild and appears resolved completely.  While you were here, you required less insulin than typical.  Keep an eye on your sugars and adjust with your PCP as needed  Also, while you were here, we noticed you have some small nodules in your lungs. These are probably nothing, but we recommend Karen [name] obtain a CT chest for you in 12 months to follow them   Increase activity slowly   Complete by: As directed       Allergies as of 11/09/2020       Reactions   Ace Inhibitors Hives   Beta Adrenergic Blockers    Junctional bradycardia   Gabapentin Hives   Lisinopril Hives   Lyrica [pregabalin] Hives   Shrimp [shellfish Allergy] Swelling   Swelling of the lips        Medication List     STOP taking these medications    insulin lispro 100 UNIT/ML KwikPen Commonly known as: HumaLOG KwikPen   losartan 50 MG tablet Commonly known as: COZAAR   Ozempic (0.25 or 0.5 MG/DOSE) 2 MG/1.5ML Sopn Generic drug: Semaglutide(0.25 or 0.'5MG'$ /DOS)       TAKE these medications    acetaminophen 500 MG tablet Commonly known as:  TYLENOL Take 1-2 tablets (500-1,000 mg total) by mouth every 6 (six) hours as needed for mild pain or fever. Do not take more than 4 grams a day   albuterol 108 (90 Base) MCG/ACT inhaler Commonly known as: VENTOLIN HFA Inhale 2 puffs into the lungs every 6 (six) hours as needed for wheezing or shortness of breath.   atorvastatin 10 MG tablet Commonly known as: LIPITOR Take 10 mg by mouth daily.   benzonatate 100 MG capsule Commonly known as: TESSALON Take 2 capsules (200 mg total) by mouth every 8 (eight) hours.   Dulaglutide 3 MG/0.5ML Sopn Inject 3 mg into the skin every Wednesday.   Eliquis 5 MG Tabs tablet Generic drug:  apixaban Take 1 tablet by mouth twice daily   esomeprazole 40 MG capsule Commonly known as: NEXIUM Take 40 mg by mouth daily.   fluticasone 50 MCG/ACT nasal spray Commonly known as: FLONASE Place 2 sprays into both nostrils daily.   Fluticasone-Umeclidin-Vilant 100-62.5-25 MCG/INH Aepb Inhale 1 puff into the lungs daily.   furosemide 40 MG tablet Commonly known as: Lasix Take 1 tablet (40 mg total) by mouth daily.   guaiFENesin-dextromethorphan 100-10 MG/5ML syrup Commonly known as: ROBITUSSIN DM Take 10 mLs by mouth every 4 (four) hours as needed for cough.   hydrochlorothiazide 25 MG tablet Commonly known as: HYDRODIURIL Take 25 mg by mouth daily.   ipratropium 0.06 % nasal spray Commonly known as: ATROVENT Place 2 sprays into both nostrils 4 (four) times daily.   ipratropium-albuterol 0.5-2.5 (3) MG/3ML Soln Commonly known as: DUONEB Take 3 mLs by nebulization 4 (four) times daily as needed.   Iron 325 (65 Fe) MG Tabs Take 1 tablet by mouth daily.   Lantus SoloStar 100 UNIT/ML Solostar Pen Generic drug: insulin glargine Inject 38 Units into the skin daily. What changed:  how much to take when to take this   metFORMIN 500 MG tablet Commonly known as: GLUCOPHAGE Take 1,000 mg by mouth 2 (two) times daily.   montelukast 10 MG tablet Commonly known as: SINGULAIR Take 10 mg by mouth at bedtime.   oxyCODONE 5 MG immediate release tablet Commonly known as: Oxy IR/ROXICODONE Take 1 tablet (5 mg total) by mouth every 4 (four) hours as needed for severe pain.   promethazine-dextromethorphan 6.25-15 MG/5ML syrup Commonly known as: PROMETHAZINE-DM Take 5 mLs by mouth 4 (four) times daily as needed.   vitamin B-12 500 MCG tablet Commonly known as: CYANOCOBALAMIN Take 500 mcg by mouth daily.        Contact information for after-discharge care     Destination     [facility] .   Service: Skilled Nursing Contact  information: [address] [place] [phone]                    Allergies  Allergen Reactions   Ace Inhibitors Hives   Beta Adrenergic Blockers     Junctional bradycardia   Gabapentin Hives   Lisinopril Hives   Lyrica [Pregabalin] Hives   Shrimp [Shellfish Allergy] Swelling    Swelling of the lips       Procedures/Studies: DG Chest 2 View  Result Date: 10/29/2020 CLINICAL DATA:  Shortness of breath. EXAM: CHEST - 2 VIEW COMPARISON:  Multiple chest radiographs, most recently 07/14/2020. CT chest, 06/23/2016. FINDINGS: Enlargement of the cardiac silhouette. Aortic vascular calcifications. Hypoinflation. The LEFT lung is relatively clear with trace basilar streaky opacity. Layering, small volume RIGHT pleural effusion,  with adjacent basilar consolidation. No pneumothorax. No acute osseous abnormality. IMPRESSION: 1. Cardiomegaly with small volume RIGHT pleural effusion. 2. RIGHT basilar opacity likely to represent atelectasis, though early pneumonia could appear similar. 3.  Aortic Atherosclerosis (ICD10-I70.0). Electronically Signed   By: Michaelle Birks M.D.   On: 10/29/2020 13:47   CT HEAD WO CONTRAST (5MM)  Result Date: 10/30/2020 CLINICAL DATA:  Minor head trauma. Hematoma to left forehead after fall. EXAM: CT HEAD WITHOUT CONTRAST TECHNIQUE: Contiguous axial images were obtained from the base of the skull through the vertex without intravenous contrast. COMPARISON:  October 29, 2020 FINDINGS: Brain: Prominent ventricles and sulci. No subdural, epidural, or subarachnoid hemorrhage. No mass effect or midline shift. No acute cortical ischemia or infarct. Cerebellum, brainstem, and basal cisterns are normal. Vascular: Calcified atherosclerosis is seen in the intracranial carotids. Skull: Normal. Negative for fracture or focal lesion. Sinuses/Orbits: No acute finding. Other: A hematoma seen over the left forehead. IMPRESSION: 1. No acute intracranial  abnormalities. 2. Hematoma over the left forehead. 3. No other abnormalities. Electronically Signed   By: Dorise Bullion III M.D.   On: 10/30/2020 10:36   CT HEAD WO CONTRAST (5MM)  Result Date: 10/29/2020 CLINICAL DATA:  Loss of consciousness, syncope EXAM: CT HEAD WITHOUT CONTRAST TECHNIQUE: Contiguous axial images were obtained from the base of the skull through the vertex without intravenous contrast. COMPARISON:  01/07/2015 FINDINGS: Brain: No acute infarct or hemorrhage. Lateral ventricles and midline structures are unremarkable. No acute extra-axial fluid collections. No mass effect. Vascular: No hyperdense vessel or unexpected calcification. Skull: Normal. Negative for fracture or focal lesion. Sinuses/Orbits: Minimal polypoid mucosal thickening within the sphenoid sinus. Remaining paranasal sinuses are clear. Other: None. IMPRESSION: 1. No acute intracranial process. Electronically Signed   By: Randa Ngo M.D.   On: 10/29/2020 16:01   CT Chest Wo Contrast  Result Date: 10/29/2020 CLINICAL DATA:  Cough, persistent.  Syncopal episode. EXAM: CT CHEST WITHOUT CONTRAST TECHNIQUE: Multidetector CT imaging of the chest was performed following the standard protocol without IV contrast. COMPARISON:  10/11/2020 and chest radiograph 10/29/2020 FINDINGS: Cardiovascular: Atherosclerotic calcifications in the thoracic aorta without enlargement. Coronary artery calcifications. Heart size is prominent without pericardial effusion. Mediastinum/Nodes: Prominent precarinal lymph node measures 1.3 cm on sequence 2 image 53. Additional small mediastinal lymph nodes. Lymph node in the AP window measures 1.2 cm on sequence 2 image 57. Limited evaluation for hilar lymph nodes due to the lack of IV contrast. No axillary lymph node enlargement. Lungs/Pleura: New right pleural effusion. The effusion is small to moderate in size. Trachea and mainstem bronchi are patent. Near complete consolidation and volume loss in the  right lower lobe. This finding is new since 10/11/2020. Nodule along the right minor fissure measures 6 x 4 mm, mean 5 mm. This nodule is best seen on sequence 2 image 81. Small peripheral calcified granuloma in right middle lobe on image 93. 4 mm nodule in the left upper lobe on sequence 3 image 62. Small nodule along the left major fissure on image 68. No significant airspace disease or consolidation in the left lung. Upper Abdomen: Cholecystectomy. Enlargement of the left hepatic lobe. Slight nodular contour of the liver and findings could be associated with cirrhosis. Small lymph nodes in the upper abdomen. No upper abdominal ascites. Musculoskeletal: No acute bone abnormality. IMPRESSION: 1. Small to moderate sized right pleural effusion with near complete collapse of the right lower lobe. Right lower lobe collapse could be related to compressive atelectasis but  can not exclude underlying pneumonia. 2. Prominent mediastinal lymph nodes as described. Index lymph node measures 1.3 cm in the short axis. These lymph nodes could be reactive but indeterminate. Recommend follow-up to ensure stability. 3. Enlargement of the left hepatic lobe with slight nodular contour to the liver. Findings are suspicious for cirrhosis. 4. Aortic Atherosclerosis (ICD10-I70.0). Coronary artery calcifications. 5. Several small pulmonary nodules. Largest pulmonary nodule has a mean diameter of 5 mm. These nodules are indeterminate. No follow-up needed if patient is low-risk (and has no known or suspected primary neoplasm). Non-contrast chest CT can be considered in 12 months if patient is high-risk. This recommendation follows the consensus statement: Guidelines for Management of Incidental Pulmonary Nodules Detected on CT Images: From the Fleischner Society 2017; Radiology 2017; 284:228-243. Electronically Signed   By: Markus Daft M.D.   On: 10/29/2020 16:14   CT ABDOMEN PELVIS W CONTRAST  Result Date: 10/11/2020 CLINICAL DATA:  Right  mid and lower quadrant abdominal pain for 3 days. Nausea. EXAM: CT ABDOMEN AND PELVIS WITH CONTRAST TECHNIQUE: Multidetector CT imaging of the abdomen and pelvis was performed using the standard protocol following bolus administration of intravenous contrast. CONTRAST:  72m OMNIPAQUE IOHEXOL 350 MG/ML SOLN COMPARISON:  Noncontrast CT on 05/21/2020 FINDINGS: Lower Chest: No acute findings. Hepatobiliary: No hepatic masses identified. Hypertrophy of left hepatic lobe again noted, and cirrhosis cannot definitely be excluded. Prior cholecystectomy. No evidence of biliary obstruction. Pancreas:  No mass or inflammatory changes. Spleen: Within normal limits in size and appearance. Adrenals/Urinary Tract: No masses identified. No evidence of ureteral calculi or hydronephrosis. Stomach/Bowel: No evidence of obstruction, inflammatory process or abnormal fluid collections. Diffuse colonic diverticulosis is noted, however there is no evidence of diverticulitis. Although the appendix is not directly visualized, no inflammatory process seen in region of the cecum or elsewhere. Vascular/Lymphatic: No pathologically enlarged lymph nodes. No acute vascular findings. Aortic atherosclerotic calcification noted. Reproductive:  No mass or other significant abnormality. Other:  None. Musculoskeletal:  No suspicious bone lesions identified. IMPRESSION: No acute findings. Colonic diverticulosis, without radiographic evidence of diverticulitis. Aortic Atherosclerosis (ICD10-I70.0). Electronically Signed   By: JMarlaine HindM.D.   On: 10/11/2020 13:08   DG Chest Port 1 View  Result Date: 11/02/2020 CLINICAL DATA:  Pleural effusion EXAM: PORTABLE CHEST 1 VIEW COMPARISON:  Portable exam 0801 hours compared to 10/29/2020 FINDINGS: Enlargement of cardiac silhouette with pulmonary vascular congestion. Atherosclerotic calcification aorta. Persistent RIGHT pleural effusion and basilar atelectasis. Accentuation of interstitial markings in the  perihilar regions question minimal edema. No pneumothorax or acute osseous findings. IMPRESSION: Enlargement of cardiac silhouette with pulmonary vascular congestion and question minimal pulmonary edema. Persistent RIGHT pleural effusion and basilar atelectasis. Aortic Atherosclerosis (ICD10-I70.0). Electronically Signed   By: MLavonia DanaM.D.   On: 11/02/2020 10:38      Subjective: Feels better.  No cough or wheezing. No fever, no confusion.  Discharge Exam: Vitals:   11/09/20 0509 11/09/20 0832  BP: (!) 118/48 (!) 115/57  Pulse: 84 84  Resp: 16 20  Temp: 97.7 F (36.5 C) 97.7 F (36.5 C)  SpO2: 93% 95%   Vitals:   11/08/20 2037 11/09/20 0000 11/09/20 0509 11/09/20 0832  BP: (!) 119/45 (!) 109/49 (!) 118/48 (!) 115/57  Pulse: 80 81 84 84  Resp: '18 18 16 20  '$ Temp: 98 F (36.7 C) 98.2 F (36.8 C) 97.7 F (36.5 C) 97.7 F (36.5 C)  TempSrc:  Oral    SpO2: 94% 95% 93% 95%  Weight:      Height:        General: Pt is alert, awake, not in acute distress Cardiovascular: RRR, nl S1-S2, no murmurs appreciated.   No LE edema.   Respiratory: Normal respiratory rate and rhythm.  CTAB without rales or wheezes. Abdominal: Abdomen soft and non-tender.  No distension or HSM.   Neuro/Psych: Strength symmetric in upper and lower extremities.  Judgment and insight appear normal.   The results of significant diagnostics from this hospitalization (including imaging, microbiology, ancillary and laboratory) are listed below for reference.     Microbiology: No results found for this or any previous visit (from the past 240 hour(s)).   Labs: BNP (last 3 results) Recent Labs    12/05/19 1638 05/21/20 2034 06/19/20 0941  BNP 84.6 25.9 123456   Basic Metabolic Panel: Recent Labs  Lab 11/03/20 0421 11/04/20 0317 11/05/20 0351  NA 136 135 133*  K 3.8 4.7 4.6  CL 94* 92* 93*  CO2 35* 36* 35*  GLUCOSE 154* 251* 163*  BUN 37* 39* 48*  CREATININE 1.02* 1.12* 1.10*  CALCIUM 8.8* 9.1  8.9  MG 1.9  --   --    Liver Function Tests: Recent Labs  Lab 11/03/20 0421  AST 12*  ALT 19  ALKPHOS 67  BILITOT 0.8  PROT 6.5  ALBUMIN 3.3*   No results for input(s): LIPASE, AMYLASE in the last 168 hours. No results for input(s): AMMONIA in the last 168 hours. CBC: Recent Labs  Lab 11/03/20 0421 11/04/20 0317 11/05/20 0351  WBC 10.3 11.6* 12.8*  NEUTROABS 7.5  --   --   HGB 9.4* 10.0* 9.6*  HCT 30.3* 32.0* 31.3*  MCV 82.3 83.1 81.7  PLT 278 279 288   Cardiac Enzymes: No results for input(s): CKTOTAL, CKMB, CKMBINDEX, TROPONINI in the last 168 hours. BNP: Invalid input(s): POCBNP CBG: Recent Labs  Lab 11/08/20 0824 11/08/20 1204 11/08/20 1721 11/08/20 2037 11/09/20 0833  GLUCAP 131* 142* 109* 215* 139*   D-Dimer No results for input(s): DDIMER in the last 72 hours. Hgb A1c No results for input(s): HGBA1C in the last 72 hours. Lipid Profile No results for input(s): CHOL, HDL, LDLCALC, TRIG, CHOLHDL, LDLDIRECT in the last 72 hours. Thyroid function studies No results for input(s): TSH, T4TOTAL, T3FREE, THYROIDAB in the last 72 hours.  Invalid input(s): FREET3 Anemia work up No results for input(s): VITAMINB12, FOLATE, FERRITIN, TIBC, IRON, RETICCTPCT in the last 72 hours. Urinalysis    Component Value Date/Time   COLORURINE YELLOW (A) 10/29/2020 1947   APPEARANCEUR HAZY (A) 10/29/2020 1947   APPEARANCEUR Cloudy (A) 09/28/2017 1338   LABSPEC 1.011 10/29/2020 1947   LABSPEC 1.024 04/02/2014 0517   PHURINE 5.0 10/29/2020 1947   GLUCOSEU NEGATIVE 10/29/2020 1947   GLUCOSEU >=500 04/02/2014 0517   HGBUR NEGATIVE 10/29/2020 [place] 10/29/2020 1947   [place] Negative 09/28/2017 [place] Negative 04/02/2014 [place] 10/29/2020 1947   PROTEINUR 30 (A) 10/29/2020 1947   NITRITE POSITIVE (A) 10/29/2020 1947   LEUKOCYTESUR SMALL (A) 10/29/2020 1947   LEUKOCYTESUR Trace 04/02/2014 0517   Sepsis  Labs Invalid input(s): PROCALCITONIN,  WBC,  LACTICIDVEN Microbiology No results found for this or any previous visit (from the past 240 hour(s)).   Time coordinating discharge: 25 minutes The [place] controlled substances registry was reviewed for this patient    30 Day Unplanned Readmission Risk Score    Flowsheet Row ED to Hosp-Admission (Current)  from 10/29/2020 in [place] (1C)  30 Day Unplanned Readmission Risk Score (%) 32.05 Filed at 11/09/2020 0801       This score is the patient's risk of an unplanned readmission within 30 days of being discharged (0 -100%). The score is based on dignosis, age, lab data, medications, orders, and past utilization.   Low:  0-14.9   Medium: 15-21.9   High: 22-29.9   Extreme: 30 and above            SIGNED:   Edwin Dada, MD  [facility] 11/09/2020, 11:03 AM

## 2020-11-09 NOTE — Plan of Care (Signed)
Education complete.

## 2020-11-09 NOTE — TOC Progression Note (Signed)
Transition of Care [facility]) - Progression Note    Patient Details  Name: Karen [name] MRN: [id number] Date of Birth: Jun 01, 1945  Transition of Care [facility]) CM/SW [place], RN Phone Number: 11/09/2020, 9:53 AM  Clinical Narrative:   Patient out of COVID quarantine.  Will transport to [place] today at 12pm via Hospital doctor.  Patient will go to room E15 at [facility] per [facility].  [place] number [id number]    Expected Discharge Plan: Home/Self Care Barriers to Discharge: Continued Medical Work up  Expected Discharge Plan and Services Expected Discharge Plan: Home/Self Care       Living arrangements for the past 2 months: Single Family Home                 DME Arranged: N/A DME Agency: NA       HH Arranged: NA, Refused [place] Agency: NA         Social Determinants of Health (SDOH) Interventions    Readmission Risk Interventions Readmission Risk Prevention Plan 10/30/2020 06/21/2020 09/30/2019  Transportation Screening Complete Complete Complete  PCP or Specialist Appt within 5-7 Days - - -  PCP or Specialist Appt within 3-5 Days - - Complete  Home Care Screening - - -  Medication Review (RN CM) - - -  Social Work Scientific laboratory technician for [place] Planning/Counseling - - Complete  Palliative Care Screening - - Not Applicable  Medication Review Press photographer) Complete Complete Complete  PCP or Specialist appointment within 3-5 days of discharge Complete Complete -  PCP/Specialist Appt Not Complete comments - - -  HRI or Home Care Consult Patient refused Complete -  SW Recovery Care/Counseling Consult Complete Complete -  Palliative Care Screening Not Applicable Not Applicable -  [place] Not Applicable Not Applicable -  Some recent data might be hidden

## 2020-11-09 NOTE — Care Management Important Message (Signed)
Important Message  Patient Details  Name: Karen [name] MRN: [id number] Date of Birth: 1945/12/25   Medicare Important Message Given:  Yes  Late entry:  Patient is in an isolation room so I reviewed the Important Message from Medicare with the patient by phone [phone]) around 10:45 am but forgot to document it as all PCs were being used on the unit.   [name] 11/09/2020, 2:04 PM

## 2020-11-23 ENCOUNTER — Ambulatory Visit: Payer: Medicare Other | Admitting: Cardiovascular Disease

## 2020-12-02 ENCOUNTER — Encounter (HOSPITAL_COMMUNITY): Payer: Self-pay

## 2020-12-02 ENCOUNTER — Other Ambulatory Visit (HOSPITAL_COMMUNITY): Payer: Self-pay

## 2020-12-02 NOTE — Progress Notes (Signed)
Seen Karen [name] at home today.  She is doing as well as she was before hospital admittance.  She did spend 2 week sin rehab.  She is having PT visit with her at home.  Her and her husband would like an aid a few hours a day to help but unable to get an order or help with this yet.  Will see if PCP will do palliative consult.  She is agreement with them to visit.  She is watching high sodium foods.  Her husband does most of the cooking. He is her support person.  She has an adult son living with them but does not help with anything, does not even speak to them.  Stays in his room, leaves early and comes back late.  They have asked for his help and he refuses.  They have everything for daily living.  Aware of up coming appts.  She is able to get up and ambulate with walker, she states just tired and gets weak.  Will continue to visit for heart failure.   [place] [phone]

## 2020-12-07 ENCOUNTER — Encounter (HOSPITAL_COMMUNITY): Payer: Self-pay | Admitting: Radiology

## 2020-12-20 DIAGNOSIS — R2689 Other abnormalities of gait and mobility: Secondary | ICD-10-CM | POA: Insufficient documentation

## 2020-12-27 ENCOUNTER — Telehealth (HOSPITAL_COMMUNITY): Payer: Self-pay

## 2020-12-27 NOTE — Telephone Encounter (Signed)
Spoke with Lajeana and she states doing her normal.  She states has company coming in.  She states she has everything she needs.  Her husbadn is her support.  He drives her to her appts and helps her around the home.  He does the cooking and cleaning.  She is able to get around by walker.  She tries to watch high sodium foods and fluids.  She has all her medications and aware of how to take them.  Trying to get palliative care started for her, she is in agreement with this program.  Have contacted her PCP but no return of if they put the referral in.  Will continue to visit for heart failure,   [place] [phone]

## 2021-01-10 ENCOUNTER — Encounter: Payer: Self-pay | Admitting: Emergency Medicine

## 2021-01-10 ENCOUNTER — Other Ambulatory Visit: Payer: Self-pay

## 2021-01-10 ENCOUNTER — Telehealth (HOSPITAL_COMMUNITY): Payer: Self-pay

## 2021-01-10 ENCOUNTER — Ambulatory Visit
Admission: EM | Admit: 2021-01-10 | Discharge: 2021-01-10 | Disposition: A | Discharge status: Home / Self Care | Payer: Medicare Other | Attending: Physician Assistant | Admitting: Physician Assistant

## 2021-01-10 DIAGNOSIS — R0789 Other chest pain: Secondary | ICD-10-CM

## 2021-01-10 NOTE — ED Provider Notes (Signed)
[facility]    CSN: [id number] Arrival date & time: 01/10/21  1543      History   Chief Complaint Chief Complaint  Patient presents with   Chest Pain    HPI Karen [name] is a 75 y.o. female.   Patient is a 75 year old female with extensive past medical history as noted below including HFpEF, COPD, aortic stenosis, and recent hospitalization for syncope and asymptomatic COVID followed by rehab who presented today with left-sided chest pain.  Patient states pain was much worse yesterday near 9 out of 10 with difficulty sleeping getting comfortable.  Per the husband, patient pulls her self in bed using the iron headboard as well pulling on her walker while ambulating.  Patient states pain is better today.  Pain is different from her typical cardiac or pulmonary pain.  Does state pain last night was worse with movement but pain is improved today and not hurting as much with movement.  Patient seen by primary care as follow-up from her rehabilitation and possible admission.  Note reviewed.   Past Medical History:  Diagnosis Date   (HFpEF) heart failure with preserved ejection fraction ([place])    a. 2017 Echo: EF 50%; b. 06/2018 Echo: EF 50-55%; c. 08/2018 Echo: EF 50-55%, Nl RV fxn; d. 09/2019 Echo: EF 55-60%, no rwma, mild LVH, Gr1 DD, nl RV size/fxn, PASP 63.56mmHg. Mildly dil LA. Triv MR. Mod AS (AoV 0.94cm^2 VTI; mean grad 17.45mmHg).   Acute on chronic respiratory failure with hypoxia and hypercapnia (HCC) 01/07/2015   Anemia    Asterixis 01/07/2015   Asthma    Cataract    CKD (chronic kidney disease), stage III (HCC)    COPD (chronic obstructive pulmonary disease) ([place])    a. 06/2018 tobacco use, home 3L oxygen    Diabetes mellitus without complication ([place])    a. 09/2019 A1C 8.8   Edema, peripheral 04/20/2014   GI bleed 06/28/2019   History of kidney stones    Hyperlipidemia    Hypertension    Iron deficiency anemia 06/22/2014   Junctional bradycardia    a. In  setting of beta blocker therapy.   Leucocytosis 10/19/2015   Moderate aortic stenosis    a.  09/2019 Echo: Mod AS (AoV 0.94cm^2 VTI; mean grad 17.12mmHg).   Morbid obesity ([place])    Overactive bladder    Primary osteoarthritis of right knee 09/01/2016   Sciatica 01/07/2015    Patient Active Problem List   Diagnosis Date Noted   Pneumonia due to COVID-19 virus 10/29/2020   Pulmonary nodules/lesions, multiple 10/29/2020   Aortic stenosis, moderate    AF (paroxysmal atrial fibrillation) (HCC)    Gastroesophageal reflux disease without esophagitis    Acute CHF (congestive heart failure) ([place]) 06/17/2020   Chronic respiratory failure with hypercapnia ([place]) 05/21/2020   Severe sepsis ([place]) 05/21/2020   UTI (urinary tract infection) 05/21/2020   Hyperglycemia due to type 2 diabetes mellitus ([place]) 05/21/2020   Abnormal CT of liver 05/21/2020   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 05/21/2020   Morbid obesity ([place]) 05/08/2020   Acute hip pain, left 10/23/2019   Lumbar stenosis with neurogenic claudication 10/23/2019   COPD with acute exacerbation ([place]) 61/60/7371   Acute diastolic CHF (congestive heart failure) ([place]) 09/28/2019   (HFpEF) heart failure with preserved ejection fraction ([place]) 09/27/2019   Rectal bleeding 06/28/2019   Hypokalemia 06/28/2019   Hyponatremia 06/28/2019   Type 2 diabetes mellitus with hyperlipidemia ([place]) 06/28/2019   CKD (chronic  kidney disease), stage IIIa 06/28/2019   Atrial fibrillation, chronic (HCC) 06/28/2019   Pulmonary edema 02/27/2019   Bradycardia 02/24/2019   Chronic respiratory failure with hypoxia ([place]) 02/23/2019   Atypical chest pain 02/13/2019   Osteopenia of neck of left femur 11/20/2018   AVM (arteriovenous malformation) of small bowel, acquired    Acute gastric ulcer with hemorrhage    Chronic diastolic heart failure ([place]) 08/22/2018   Diarrhea 08/22/2018   Junctional bradycardia    Acute on chronic heart failure with preserved ejection  fraction (HFpEF) ([place])    AKI (acute kidney injury) ([place])    Symptomatic bradycardia 08/05/2018   Acute on chronic respiratory failure ([place]) 06/25/2018   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 diabetes mellitus ([place]) 01/25/2018   History of non anemic vitamin B12 deficiency 01/25/2018   Personal history of kidney stones 11/12/2017   Urge incontinence 11/12/2017   History of leukocytosis 09/17/2017   Right ureteral stone 09/15/2017   Acute GI bleeding    GI bleed 08/26/2017   Arthritis 08/10/2017   Stage 4 chronic kidney disease ([place]) 08/10/2017   COPD (chronic obstructive pulmonary disease) ([place]) 08/10/2017   Diabetes mellitus type 2, uncomplicated ([place]) 70/35/0093   Hypertension 08/10/2017   Obesity (BMI 35.0-39.9 without comorbidity) 04/11/2017   Primary osteoarthritis of right knee 09/01/2016   Leucocytosis 10/19/2015   Asterixis 01/07/2015   Acute on chronic respiratory failure with hypoxia and hypercapnia (HCC) 01/07/2015   Sciatica 01/07/2015   Weakness 01/07/2015   Chronic 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 01/04/2015   Iron deficiency anemia 06/22/2014   Microalbuminuria 06/22/2014   CHF (congestive heart failure) ([place]) 04/20/2014   Edema, peripheral 04/20/2014    Past Surgical History:  Procedure Laterality Date   APPENDECTOMY     CESAREAN SECTION     x3   CHOLECYSTECTOMY     COLONOSCOPY WITH PROPOFOL N/A 08/28/2017   Procedure: COLONOSCOPY WITH PROPOFOL;  Surgeon: Lucilla Lame, MD;  Location: [facility];  Service: Endoscopy;  Laterality: N/A;   COLONOSCOPY WITH PROPOFOL N/A 08/29/2017   Procedure: COLONOSCOPY WITH PROPOFOL;  Surgeon: Lucilla Lame, MD;  Location: [facility] ENDOSCOPY;  Service: Endoscopy;  Laterality: N/A;   CYSTOSCOPY W/ URETERAL STENT PLACEMENT Right 09/15/2017   Procedure: CYSTOSCOPY WITH RETROGRADE PYELOGRAM/URETERAL STENT PLACEMENT;  Surgeon: Cleon Gustin, MD;  Location: [facility] ORS;  Service: Urology;  Laterality:  Right;   CYSTOSCOPY/URETEROSCOPY/HOLMIUM LASER/STENT PLACEMENT Right 10/09/2017   Procedure: CYSTOSCOPY/URETEROSCOPY/HOLMIUM LASER/STENT PLACEMENT;  Surgeon: Abbie Sons, MD;  Location: [facility] OR[facility];  Service: Urology;  Laterality: Right;  right Stent exchange   ESOPHAGOGASTRODUODENOSCOPY (EGD) WITH PROPOFOL N/A 09/16/2018   Procedure: ESOPHAGOGASTRODUODENOSCOPY (EGD) WITH PROPOFOL;  Surgeon: Lin Landsman, MD;  Location: [facility];  Service: Gastroenterology;  Laterality: N/A;   EYE SURGERY      OB History   No obstetric history on file.      Home Medications    Prior to Admission medications   Medication Sig Start Date End Date Taking? Authorizing Provider  acetaminophen (TYLENOL) 500 MG tablet Take 1-2 tablets (500-1,000 mg total) by mouth every 6 (six) hours as needed for mild pain or fever. Do not take more than 4 grams a day 05/25/20   Debbe Odea, MD  albuterol (VENTOLIN HFA) 108 (90 Base) MCG/ACT inhaler Inhale 2 puffs into the lungs every 6 (six) hours as needed for wheezing or shortness of breath.     [provider]  apixaban (ELIQUIS) 5 MG TABS tablet Take 1  tablet by mouth twice daily 10/25/20   [name], MD  atorvastatin (LIPITOR) 10 MG tablet Take 10 mg by mouth daily.     [provider]  benzonatate (TESSALON) 100 MG capsule Take 2 capsules (200 mg total) by mouth every 8 (eight) hours. Patient not taking: No sig reported 07/14/20   Margarette Canada, NP  Dulaglutide 3 MG/0.5ML SOPN Inject 3 mg into the skin every Wednesday. 09/30/20   [provider]  esomeprazole (NEXIUM) 40 MG capsule Take 40 mg by mouth daily. 12/17/18   [provider]  Ferrous Sulfate (IRON) 325 (65 Fe) MG TABS Take 1 tablet by mouth daily. 09/14/17   [provider]  fluticasone (FLONASE) 50 MCG/ACT nasal spray Place 2 sprays into both nostrils daily. 11/15/18   [provider]  Fluticasone-Umeclidin-Vilant 100-62.5-25 MCG/INH AEPB  Inhale 1 puff into the lungs daily. 12/25/17   [provider]  furosemide (LASIX) 40 MG tablet Take 1 tablet (40 mg total) by mouth daily. 06/21/20 06/21/21  Val Riles, MD  guaiFENesin-dextromethorphan (ROBITUSSIN DM) 100-10 MG/5ML syrup Take 10 mLs by mouth every 4 (four) hours as needed for cough. 11/09/20   Danford, Suann Larry, MD  hydrochlorothiazide (HYDRODIURIL) 25 MG tablet Take 25 mg by mouth daily. 08/28/20   [provider]  insulin glargine (LANTUS SOLOSTAR) 100 UNIT/ML Solostar Pen Inject 38 Units into the skin daily. 11/09/20   Danford, Suann Larry, MD  ipratropium (ATROVENT) 0.06 % nasal spray Place 2 sprays into both nostrils 4 (four) times daily. 05/28/20   Margarette Canada, NP  ipratropium-albuterol (DUONEB) 0.5-2.5 (3) MG/3ML SOLN Take 3 mLs by nebulization 4 (four) times daily as needed. 10/20/20   [provider]  metFORMIN (GLUCOPHAGE) 500 MG tablet Take 1,000 mg by mouth 2 (two) times daily.    [provider]  montelukast (SINGULAIR) 10 MG tablet Take 10 mg by mouth at bedtime.    [provider]  oxyCODONE (OXY IR/ROXICODONE) 5 MG immediate release tablet Take 1 tablet (5 mg total) by mouth every 4 (four) hours as needed for severe pain. 11/09/20   Danford, Suann Larry, MD  promethazine-dextromethorphan (PROMETHAZINE-DM) 6.25-15 MG/5ML syrup Take 5 mLs by mouth 4 (four) times daily as needed. Patient not taking: No sig reported 07/14/20   Margarette Canada, NP  vitamin B-12 (CYANOCOBALAMIN) 500 MCG tablet Take 500 mcg by mouth daily.    [provider]    Family History Family History  Problem Relation Age of Onset   Other Mother        unknown medical history   Other Father        unknown medical history    Social History Social History   Tobacco Use   Smoking status: Former    Packs/day: 1.00    Years: 20.00    Pack years: 20.00    Types: Cigarettes    Quit date: 12/04/1992    Years since quitting: 28.1    Smokeless tobacco: Never  Vaping Use   Vaping Use: Never used  Substance Use Topics   Alcohol use: No   Drug use: No     Allergies   Ace inhibitors, Beta adrenergic blockers, Gabapentin, Lisinopril, Lyrica [pregabalin], and Shrimp [shellfish allergy]   Review of Systems Review of Systems as noted above in HPI.  Other systems reviewed and found to be negative   Physical Exam Triage Vital Signs ED Triage Vitals  Enc Vitals Group     BP 01/10/21 1555 122/66  Pulse Rate 01/10/21 1555 80     Resp 01/10/21 1555 20     Temp 01/10/21 1555 98.6 F (37 C)     Temp Source 01/10/21 1555 Oral     SpO2 01/10/21 1555 95 %     Weight --      Height --      Head Circumference --      Peak Flow --      Pain Score 01/10/21 1559 9     Pain Loc --      Pain Edu? --      Excl. in [place]? --    No data found.  Updated Vital Signs BP 122/66 (BP Location: Right Arm)   Pulse 80   Temp 98.6 F (37 C) (Oral)   Resp 20   SpO2 95%    Physical Exam Constitutional:      Appearance: She is well-developed.     Comments: Sitting in wheelchair on oxygen.  HENT:     Head: Normocephalic and atraumatic.  Cardiovascular:     Rate and Rhythm: Normal rate and regular rhythm.     Heart sounds: No murmur heard. Pulmonary:     Effort: Pulmonary effort is normal. No respiratory distress.     Breath sounds: Decreased breath sounds (diffusely) present. No wheezing, rhonchi or rales.  Chest:     Chest wall: Tenderness present.    Musculoskeletal:     Right lower leg: Edema (minimal) present.     Left lower leg: Edema (minimal) present.     Comments: Chronic venous changes  Skin:    General: Skin is warm and dry.  Neurological:     General: No focal deficit present.     Mental Status: She is alert and oriented to person, place, and time.     UC Treatments / Results  Labs (all labs ordered are listed, but only abnormal results are displayed) Labs Reviewed - No data to display  EKG:  Normal sinus rhythm.  Ventricular rate 87 QTc 48.3.  No acute ST changes noted   Radiology No results found.  Procedures Procedures (including critical care time)  Medications Ordered in UC Medications - No data to display  Initial Impression / Assessment and Plan / UC Course  I have reviewed the triage vital signs and the nursing notes.  Pertinent labs & imaging results that were available during my care of the patient were reviewed by me and considered in my medical decision making (see chart for details).    Patient is a 75 year old female presenting with chest wall pain that was worse last evening, rated at a 9 out of 10.  Pain was worse with movement.  Pain is improved today.  Pain is reproducible with palpation.  Patient has been in hospital recently in rehab and has a overall weakness and debility, requiring walker use.  Patient has been followed up by her primary care is working on her overall strength.  EKG is negative for any acute changes.  Final Clinical Impressions(s) / UC Diagnoses   Final diagnoses:  Chest wall pain     Discharge Instructions      -Pain seems to be related to the muscles of the chest wall itself and not related to the heart or your lungs. -Recommend ibuprofen and Tylenol as needed for pain.  Do not recommend something like muscle relaxant given your history and recent weakness from the illness. -Should she have acute worsening of your pain, chest pain, and or  shortness of breath, recommend calling 911 for evaluation of the ER -Follow with primary care as needed     ED Prescriptions   None    PDMP not reviewed this encounter.   Luvenia Redden, PA-C 01/10/21 1642

## 2021-01-10 NOTE — Discharge Instructions (Signed)
-  Pain seems to be related to the muscles of the chest wall itself and not related to the heart or your lungs. -Recommend ibuprofen and Tylenol as needed for pain.  Do not recommend something like muscle relaxant given your history and recent weakness from the illness. -Should she have acute worsening of your pain, chest pain, and or shortness of breath, recommend calling 911 for evaluation of the ER -Follow with primary care as needed

## 2021-01-10 NOTE — Telephone Encounter (Signed)
Had a home visit scheduled with Karen [name] today and she advised started having chest pain yesterday.  She has tried tums with no relief.  She states she is going to urgent Care in [place] to be checked.  She states under her left breast, does not radiate anywhere.  She denies nausea or tingleness down her arm.  Appetite is good.  She was able to sleep well last night.  She denies increased shortness of breath and denies feeling like she has fluid.  Pain does not worsen or improve with movement.  Urinating good.  She has all her medications.  She states will go POV to urgent care and feels she is able to do that.  She refused going to ED or going to her PCP or cardiology.  Will continue to visit for heart failure.   [place] [phone]

## 2021-01-10 NOTE — ED Triage Notes (Signed)
Pt presents today with husband with c/o chest pain that began last evening. She is on Oxygen 3L continuously for COPD. Denies n/v/d.

## 2021-02-01 ENCOUNTER — Emergency Department: Payer: Medicare Other

## 2021-02-01 ENCOUNTER — Inpatient Hospital Stay: Payer: Medicare Other

## 2021-02-01 ENCOUNTER — Other Ambulatory Visit: Payer: Self-pay

## 2021-02-01 ENCOUNTER — Inpatient Hospital Stay
Admission: EM | Admit: 2021-02-01 | Discharge: 2021-02-12 | DRG: 291 | Disposition: A | Discharge status: Skilled Nursing Facility | Payer: Medicare Other | Attending: Internal Medicine | Admitting: Internal Medicine

## 2021-02-01 DIAGNOSIS — N189 Chronic kidney disease, unspecified: Secondary | ICD-10-CM

## 2021-02-01 DIAGNOSIS — J432 Centrilobular emphysema: Secondary | ICD-10-CM | POA: Diagnosis not present

## 2021-02-01 DIAGNOSIS — K7030 Alcoholic cirrhosis of liver without ascites: Secondary | ICD-10-CM | POA: Diagnosis not present

## 2021-02-01 DIAGNOSIS — G9341 Metabolic encephalopathy: Secondary | ICD-10-CM | POA: Diagnosis present

## 2021-02-01 DIAGNOSIS — E1169 Type 2 diabetes mellitus with other specified complication: Secondary | ICD-10-CM | POA: Diagnosis present

## 2021-02-01 DIAGNOSIS — Z66 Do not resuscitate: Secondary | ICD-10-CM | POA: Diagnosis not present

## 2021-02-01 DIAGNOSIS — J918 Pleural effusion in other conditions classified elsewhere: Secondary | ICD-10-CM | POA: Diagnosis present

## 2021-02-01 DIAGNOSIS — I083 Combined rheumatic disorders of mitral, aortic and tricuspid valves: Secondary | ICD-10-CM | POA: Diagnosis present

## 2021-02-01 DIAGNOSIS — N390 Urinary tract infection, site not specified: Secondary | ICD-10-CM

## 2021-02-01 DIAGNOSIS — Z6841 Body Mass Index (BMI) 40.0 and over, adult: Secondary | ICD-10-CM | POA: Diagnosis not present

## 2021-02-01 DIAGNOSIS — E871 Hypo-osmolality and hyponatremia: Secondary | ICD-10-CM | POA: Diagnosis present

## 2021-02-01 DIAGNOSIS — R188 Other ascites: Secondary | ICD-10-CM

## 2021-02-01 DIAGNOSIS — I5031 Acute diastolic (congestive) heart failure: Secondary | ICD-10-CM | POA: Diagnosis present

## 2021-02-01 DIAGNOSIS — N179 Acute kidney failure, unspecified: Secondary | ICD-10-CM | POA: Diagnosis present

## 2021-02-01 DIAGNOSIS — Z91013 Allergy to seafood: Secondary | ICD-10-CM

## 2021-02-01 DIAGNOSIS — B9620 Unspecified Escherichia coli [E. coli] as the cause of diseases classified elsewhere: Secondary | ICD-10-CM | POA: Diagnosis present

## 2021-02-01 DIAGNOSIS — Z9889 Other specified postprocedural states: Secondary | ICD-10-CM

## 2021-02-01 DIAGNOSIS — E1165 Type 2 diabetes mellitus with hyperglycemia: Secondary | ICD-10-CM | POA: Diagnosis not present

## 2021-02-01 DIAGNOSIS — I509 Heart failure, unspecified: Secondary | ICD-10-CM

## 2021-02-01 DIAGNOSIS — J90 Pleural effusion, not elsewhere classified: Secondary | ICD-10-CM

## 2021-02-01 DIAGNOSIS — B961 Klebsiella pneumoniae [K. pneumoniae] as the cause of diseases classified elsewhere: Secondary | ICD-10-CM | POA: Diagnosis present

## 2021-02-01 DIAGNOSIS — Z20822 Contact with and (suspected) exposure to covid-19: Secondary | ICD-10-CM | POA: Diagnosis present

## 2021-02-01 DIAGNOSIS — Z23 Encounter for immunization: Secondary | ICD-10-CM | POA: Diagnosis present

## 2021-02-01 DIAGNOSIS — J449 Chronic obstructive pulmonary disease, unspecified: Secondary | ICD-10-CM | POA: Diagnosis present

## 2021-02-01 DIAGNOSIS — I130 Hypertensive heart and chronic kidney disease with heart failure and stage 1 through stage 4 chronic kidney disease, or unspecified chronic kidney disease: Principal | ICD-10-CM | POA: Diagnosis present

## 2021-02-01 DIAGNOSIS — R7989 Other specified abnormal findings of blood chemistry: Secondary | ICD-10-CM | POA: Diagnosis not present

## 2021-02-01 DIAGNOSIS — E785 Hyperlipidemia, unspecified: Secondary | ICD-10-CM | POA: Diagnosis present

## 2021-02-01 DIAGNOSIS — I10 Essential (primary) hypertension: Secondary | ICD-10-CM | POA: Diagnosis present

## 2021-02-01 DIAGNOSIS — J9621 Acute and chronic respiratory failure with hypoxia: Secondary | ICD-10-CM | POA: Diagnosis present

## 2021-02-01 DIAGNOSIS — E1122 Type 2 diabetes mellitus with diabetic chronic kidney disease: Secondary | ICD-10-CM | POA: Diagnosis present

## 2021-02-01 DIAGNOSIS — Z794 Long term (current) use of insulin: Secondary | ICD-10-CM

## 2021-02-01 DIAGNOSIS — R531 Weakness: Secondary | ICD-10-CM | POA: Diagnosis not present

## 2021-02-01 DIAGNOSIS — Z7901 Long term (current) use of anticoagulants: Secondary | ICD-10-CM | POA: Diagnosis not present

## 2021-02-01 DIAGNOSIS — I5033 Acute on chronic diastolic (congestive) heart failure: Secondary | ICD-10-CM | POA: Diagnosis present

## 2021-02-01 DIAGNOSIS — I493 Ventricular premature depolarization: Secondary | ICD-10-CM | POA: Diagnosis present

## 2021-02-01 DIAGNOSIS — N1831 Chronic kidney disease, stage 3a: Secondary | ICD-10-CM | POA: Diagnosis present

## 2021-02-01 DIAGNOSIS — K7460 Unspecified cirrhosis of liver: Secondary | ICD-10-CM | POA: Diagnosis not present

## 2021-02-01 DIAGNOSIS — D631 Anemia in chronic kidney disease: Secondary | ICD-10-CM | POA: Diagnosis present

## 2021-02-01 DIAGNOSIS — I350 Nonrheumatic aortic (valve) stenosis: Secondary | ICD-10-CM | POA: Diagnosis not present

## 2021-02-01 DIAGNOSIS — Z79899 Other long term (current) drug therapy: Secondary | ICD-10-CM

## 2021-02-01 DIAGNOSIS — Z6838 Body mass index (BMI) 38.0-38.9, adult: Secondary | ICD-10-CM | POA: Diagnosis not present

## 2021-02-01 DIAGNOSIS — I272 Pulmonary hypertension, unspecified: Secondary | ICD-10-CM | POA: Diagnosis present

## 2021-02-01 DIAGNOSIS — G8929 Other chronic pain: Secondary | ICD-10-CM | POA: Diagnosis present

## 2021-02-01 DIAGNOSIS — I480 Paroxysmal atrial fibrillation: Secondary | ICD-10-CM | POA: Diagnosis present

## 2021-02-01 DIAGNOSIS — Z79890 Hormone replacement therapy (postmenopausal): Secondary | ICD-10-CM

## 2021-02-01 DIAGNOSIS — Z9049 Acquired absence of other specified parts of digestive tract: Secondary | ICD-10-CM

## 2021-02-01 DIAGNOSIS — I2510 Atherosclerotic heart disease of native coronary artery without angina pectoris: Secondary | ICD-10-CM | POA: Diagnosis present

## 2021-02-01 DIAGNOSIS — J441 Chronic obstructive pulmonary disease with (acute) exacerbation: Secondary | ICD-10-CM | POA: Diagnosis present

## 2021-02-01 DIAGNOSIS — Z87442 Personal history of urinary calculi: Secondary | ICD-10-CM

## 2021-02-01 DIAGNOSIS — Z7189 Other specified counseling: Secondary | ICD-10-CM | POA: Diagnosis not present

## 2021-02-01 DIAGNOSIS — N183 Chronic kidney disease, stage 3 unspecified: Secondary | ICD-10-CM | POA: Diagnosis present

## 2021-02-01 DIAGNOSIS — J9611 Chronic respiratory failure with hypoxia: Secondary | ICD-10-CM | POA: Diagnosis not present

## 2021-02-01 DIAGNOSIS — Z9981 Dependence on supplemental oxygen: Secondary | ICD-10-CM | POA: Diagnosis not present

## 2021-02-01 DIAGNOSIS — R140 Abdominal distension (gaseous): Secondary | ICD-10-CM | POA: Diagnosis not present

## 2021-02-01 DIAGNOSIS — D649 Anemia, unspecified: Secondary | ICD-10-CM | POA: Diagnosis not present

## 2021-02-01 DIAGNOSIS — Z7984 Long term (current) use of oral hypoglycemic drugs: Secondary | ICD-10-CM

## 2021-02-01 DIAGNOSIS — T83511A Infection and inflammatory reaction due to indwelling urethral catheter, initial encounter: Secondary | ICD-10-CM | POA: Diagnosis not present

## 2021-02-01 DIAGNOSIS — Z87891 Personal history of nicotine dependence: Secondary | ICD-10-CM

## 2021-02-01 DIAGNOSIS — Z888 Allergy status to other drugs, medicaments and biological substances status: Secondary | ICD-10-CM

## 2021-02-01 LAB — BASIC METABOLIC PANEL
Anion gap: 7 (ref 5–15)
BUN: 48 mg/dL — ABNORMAL HIGH (ref 8–23)
CO2: 24 mmol/L (ref 22–32)
Calcium: 8.9 mg/dL (ref 8.9–10.3)
Chloride: 97 mmol/L — ABNORMAL LOW (ref 98–111)
Creatinine, Ser: 1.28 mg/dL — ABNORMAL HIGH (ref 0.44–1.00)
GFR, Estimated: 43 mL/min — ABNORMAL LOW (ref >60)
Glucose, Bld: 172 mg/dL — ABNORMAL HIGH (ref 70–99)
Potassium: 5.2 mmol/L — ABNORMAL HIGH (ref 3.5–5.1)
Sodium: 128 mmol/L — ABNORMAL LOW (ref 135–145)

## 2021-02-01 LAB — GLUCOSE, CAPILLARY: Glucose-Capillary: 128 mg/dL — ABNORMAL HIGH (ref 70–99)

## 2021-02-01 LAB — TROPONIN I (HIGH SENSITIVITY)
Troponin I (High Sensitivity): 11 ng/L (ref <18)
Troponin I (High Sensitivity): 10 ng/L (ref <18)

## 2021-02-01 LAB — RESP PANEL BY RT-PCR (FLU A&B, COVID) ARPGX2
Influenza A by PCR: NEGATIVE
Influenza B by PCR: NEGATIVE
SARS Coronavirus 2 by RT PCR: NEGATIVE

## 2021-02-01 LAB — CBC
HCT: 29.6 % — ABNORMAL LOW (ref 36.0–46.0)
Hemoglobin: 9.1 g/dL — ABNORMAL LOW (ref 12.0–15.0)
MCH: 25.2 pg — ABNORMAL LOW (ref 26.0–34.0)
MCHC: 30.7 g/dL (ref 30.0–36.0)
MCV: 82 fL (ref 80.0–100.0)
Platelets: 306 10*3/uL (ref 150–400)
RBC: 3.61 MIL/uL — ABNORMAL LOW (ref 3.87–5.11)
RDW: 18 % — ABNORMAL HIGH (ref 11.5–15.5)
WBC: 11.3 10*3/uL — ABNORMAL HIGH (ref 4.0–10.5)
nRBC: 0 % (ref 0.0–0.2)

## 2021-02-01 LAB — BRAIN NATRIURETIC PEPTIDE: B Natriuretic Peptide: 935.1 pg/mL — ABNORMAL HIGH (ref 0.0–100.0)

## 2021-02-01 MED ORDER — ATORVASTATIN CALCIUM 10 MG PO TABS
10.0000 mg | ORAL_TABLET | Freq: Every day | ORAL | Status: DC
  Administered 2021-02-02 – 2021-02-12 (×11): 10 mg via ORAL
  Filled 2021-02-01 (×11): qty 1

## 2021-02-01 MED ORDER — ACETAMINOPHEN 500 MG PO TABS
500.0000 mg | ORAL_TABLET | Freq: Four times a day (QID) | ORAL | Status: DC | PRN
Start: 2021-02-01 — End: 2021-02-12
  Administered 2021-02-04 – 2021-02-05 (×3): 1000 mg via ORAL
  Filled 2021-02-01 (×3): qty 2

## 2021-02-01 MED ORDER — INSULIN ASPART 100 UNIT/ML IJ SOLN
0.0000 [IU] | Freq: Three times a day (TID) | INTRAMUSCULAR | Status: DC
Start: 2021-02-01 — End: 2021-02-03
  Administered 2021-02-02: 3 [IU] via SUBCUTANEOUS
  Administered 2021-02-02: 4 [IU] via SUBCUTANEOUS
  Administered 2021-02-02: 7 [IU] via SUBCUTANEOUS
  Administered 2021-02-03: 4 [IU] via SUBCUTANEOUS
  Administered 2021-02-03: 7 [IU] via SUBCUTANEOUS
  Filled 2021-02-01 (×5): qty 1

## 2021-02-01 MED ORDER — FUROSEMIDE 10 MG/ML IJ SOLN
40.0000 mg | Freq: Once | INTRAMUSCULAR | Status: AC
  Administered 2021-02-01: 40 mg via INTRAVENOUS

## 2021-02-01 MED ORDER — MONTELUKAST SODIUM 10 MG PO TABS
10.0000 mg | ORAL_TABLET | Freq: Every day | ORAL | Status: DC
  Administered 2021-02-02 – 2021-02-11 (×10): 10 mg via ORAL
  Filled 2021-02-01 (×11): qty 1

## 2021-02-01 MED ORDER — INSULIN GLARGINE-YFGN 100 UNIT/ML ~~LOC~~ SOLN
30.0000 [IU] | Freq: Every day | SUBCUTANEOUS | Status: DC
Start: 2021-02-02 — End: 2021-02-10
  Administered 2021-02-02 – 2021-02-10 (×9): 30 [IU] via SUBCUTANEOUS
  Filled 2021-02-01 (×9): qty 0.3

## 2021-02-01 MED ORDER — UMECLIDINIUM BROMIDE 62.5 MCG/ACT IN AEPB
1.0000 | INHALATION_SPRAY | Freq: Every day | RESPIRATORY_TRACT | Status: DC
  Administered 2021-02-02 – 2021-02-12 (×11): 1 via RESPIRATORY_TRACT
  Filled 2021-02-01: qty 7

## 2021-02-01 MED ORDER — PANTOPRAZOLE SODIUM 40 MG PO TBEC
40.0000 mg | DELAYED_RELEASE_TABLET | Freq: Every day | ORAL | Status: DC
  Administered 2021-02-02 – 2021-02-12 (×11): 40 mg via ORAL
  Filled 2021-02-01 (×11): qty 1

## 2021-02-01 MED ORDER — FLUTICASONE-UMECLIDIN-VILANT 100-62.5-25 MCG/INH IN AEPB: 1.0000 | INHALATION_SPRAY | Freq: Every day | RESPIRATORY_TRACT | Status: DC

## 2021-02-01 MED ORDER — INSULIN GLARGINE 100 UNIT/ML SOLOSTAR PEN: 30.0000 [IU] | PEN_INJECTOR | Freq: Every day | SUBCUTANEOUS | Status: DC

## 2021-02-01 MED ORDER — INSULIN GLARGINE 100 UNIT/ML SOLOSTAR PEN: 38.0000 [IU] | PEN_INJECTOR | Freq: Every day | SUBCUTANEOUS | Status: DC

## 2021-02-01 MED ORDER — LOSARTAN POTASSIUM 50 MG PO TABS
50.0000 mg | ORAL_TABLET | Freq: Every day | ORAL | Status: DC
  Administered 2021-02-02: 50 mg via ORAL
  Filled 2021-02-01: qty 1

## 2021-02-01 MED ORDER — FLUTICASONE PROPIONATE 50 MCG/ACT NA SUSP
2.0000 | Freq: Every day | NASAL | Status: DC
  Administered 2021-02-02 – 2021-02-12 (×11): 2 via NASAL
  Filled 2021-02-01: qty 16

## 2021-02-01 MED ORDER — APIXABAN 5 MG PO TABS
5.0000 mg | ORAL_TABLET | Freq: Two times a day (BID) | ORAL | Status: DC
  Administered 2021-02-02 – 2021-02-12 (×21): 5 mg via ORAL
  Filled 2021-02-01 (×22): qty 1

## 2021-02-01 MED ORDER — ONDANSETRON HCL 4 MG/2ML IJ SOLN: 4.0000 mg | Freq: Four times a day (QID) | INTRAMUSCULAR | Status: DC | PRN

## 2021-02-01 MED ORDER — VITAMIN B-12 1000 MCG PO TABS
500.0000 ug | ORAL_TABLET | Freq: Every day | ORAL | Status: DC
  Administered 2021-02-02 – 2021-02-12 (×11): 500 ug via ORAL
  Filled 2021-02-01 (×11): qty 1

## 2021-02-01 MED ORDER — FERROUS SULFATE 325 (65 FE) MG PO TABS
325.0000 mg | ORAL_TABLET | Freq: Every day | ORAL | Status: DC
  Administered 2021-02-02 – 2021-02-12 (×11): 325 mg via ORAL
  Filled 2021-02-01 (×11): qty 1

## 2021-02-01 MED ORDER — FUROSEMIDE 10 MG/ML IJ SOLN: 40.0000 mg | Freq: Every day | INTRAMUSCULAR | Status: DC

## 2021-02-01 MED ORDER — ONDANSETRON HCL 4 MG PO TABS: 4.0000 mg | ORAL_TABLET | Freq: Four times a day (QID) | ORAL | Status: DC | PRN

## 2021-02-01 MED ORDER — ALLOPURINOL 100 MG PO TABS
100.0000 mg | ORAL_TABLET | Freq: Every day | ORAL | Status: DC
  Administered 2021-02-02 – 2021-02-12 (×11): 100 mg via ORAL
  Filled 2021-02-01 (×12): qty 1

## 2021-02-01 MED ORDER — INFLUENZA VAC A&B SA ADJ QUAD 0.5 ML IM PRSY
0.5000 mL | PREFILLED_SYRINGE | INTRAMUSCULAR | Status: AC
  Administered 2021-02-02: 0.5 mL via INTRAMUSCULAR
  Filled 2021-02-01: qty 0.5

## 2021-02-01 MED ORDER — IPRATROPIUM-ALBUTEROL 0.5-2.5 (3) MG/3ML IN SOLN: 3.0000 mL | Freq: Four times a day (QID) | RESPIRATORY_TRACT | Status: DC | PRN

## 2021-02-01 MED ORDER — FLUTICASONE FUROATE-VILANTEROL 100-25 MCG/ACT IN AEPB
1.0000 | INHALATION_SPRAY | Freq: Every day | RESPIRATORY_TRACT | Status: DC
  Administered 2021-02-02 – 2021-02-12 (×11): 1 via RESPIRATORY_TRACT
  Filled 2021-02-01: qty 28

## 2021-02-01 NOTE — ED Notes (Addendum)
Awaiting pharmacy to verify medications to give.

## 2021-02-01 NOTE — ED Notes (Signed)
Meal tray placed at bedside.

## 2021-02-01 NOTE — ED Notes (Signed)
Bladder scanner used. Max shown 80mL. Pt wears briefs and states has not had wet brief in 3 days. Pt abd tender with palpation. Discussed with Erlene Quan PA concern for bladder scanner not being correct. Foley cath placed.

## 2021-02-01 NOTE — ED Notes (Signed)
Not voided in 3 days, wheezing bilat, breathing treatment en route, 3L on O2, usually low 90's per ems, diarrhea for 3 days, sob  Pt is from home

## 2021-02-01 NOTE — H&P (Addendum)
History and Physical    Karen [name]:[id number] DOB: Jan 22, 1946 DOA: 02/01/2021  PCP: Earlie Counts, FNP   Patient coming from: Home  I have personally briefly reviewed patient's old medical records in [place]  Chief Complaint: Difficulty voiding x3 days  HPI: Karen [name] is a 75 y.o. female with medical history significant for COPD and chronic respiratory failure on 3 L of oxygen, chronic diastolic dysfunction CHF, history of A. fib on chronic anticoagulation therapy, diabetes mellitus who presents to the ER for evaluation of a 3-day of reduced urine output. She also complains of shortness of breath mostly with exertion associated with lower extremity swelling and orthopnea.  Symptoms have been associated with intermittent chest pressure not related to rest or exertion. She complains of abdominal pain and distention which she attributes to her reduced urine output.  She has also had episodes of diarrhea for the last 3 days but denies having any nausea or vomiting. She denies having any cough, no fever, no chills, no dizziness, no lightheadedness, no headache, no blurred vision, no focal deficit no palpitations or diaphoresis. Sodium 128, potassium 5.2, chloride 97, bicarb 24, glucose 172, BUN 48, creatinine 1.28, calcium 8.9, BNP 935, Respiratory viral panel is negative Chest x-ray shows stable cardiomegaly. Twelve-lead EKG reviewed by me shows junctional rhythm.   ED Course: Patient is a 75 year old female who presents to the ER for evaluation of decreased urine output over the last 3 days.  She has had abdominal pain associated with diarrhea. Patient's renal function appears to be slightly worse with a creatinine of 1.28 on admission compared to 1.102 months ago. A bladder scan was done in the emergency room and showed about 47 cc of urine.  Foley catheter was placed in the ER for adequate input and output measurement.   Review of Systems: As per HPI otherwise  all other systems reviewed and negative.    Past Medical History:  Diagnosis Date   (HFpEF) heart failure with preserved ejection fraction ([place])    a. 2017 Echo: EF 50%; b. 06/2018 Echo: EF 50-55%; c. 08/2018 Echo: EF 50-55%, Nl RV fxn; d. 09/2019 Echo: EF 55-60%, no rwma, mild LVH, Gr1 DD, nl RV size/fxn, PASP 63.54mmHg. Mildly dil LA. Triv MR. Mod AS (AoV 0.94cm^2 VTI; mean grad 17.32mmHg).   Acute on chronic respiratory failure with hypoxia and hypercapnia (HCC) 01/07/2015   Anemia    Asterixis 01/07/2015   Asthma    Cataract    CKD (chronic kidney disease), stage III (HCC)    COPD (chronic obstructive pulmonary disease) ([place])    a. 06/2018 tobacco use, home 3L oxygen    Diabetes mellitus without complication ([place])    a. 09/2019 A1C 8.8   Edema, peripheral 04/20/2014   GI bleed 06/28/2019   History of kidney stones    Hyperlipidemia    Hypertension    Iron deficiency anemia 06/22/2014   Junctional bradycardia    a. In setting of beta blocker therapy.   Leucocytosis 10/19/2015   Moderate aortic stenosis    a.  09/2019 Echo: Mod AS (AoV 0.94cm^2 VTI; mean grad 17.37mmHg).   Morbid obesity ([place])    Overactive bladder    Primary osteoarthritis of right knee 09/01/2016   Sciatica 01/07/2015    Past Surgical History:  Procedure Laterality Date   APPENDECTOMY     CESAREAN SECTION     x3   CHOLECYSTECTOMY     COLONOSCOPY WITH PROPOFOL N/A 08/28/2017   Procedure:  COLONOSCOPY WITH PROPOFOL;  Surgeon: Lucilla Lame, MD;  Location: [facility] ENDOSCOPY;  Service: Endoscopy;  Laterality: N/A;   COLONOSCOPY WITH PROPOFOL N/A 08/29/2017   Procedure: COLONOSCOPY WITH PROPOFOL;  Surgeon: Lucilla Lame, MD;  Location: [facility] ENDOSCOPY;  Service: Endoscopy;  Laterality: N/A;   CYSTOSCOPY W/ URETERAL STENT PLACEMENT Right 09/15/2017   Procedure: CYSTOSCOPY WITH RETROGRADE PYELOGRAM/URETERAL STENT PLACEMENT;  Surgeon: Cleon Gustin, MD;  Location: [facility] ORS;  Service: Urology;  Laterality: Right;    CYSTOSCOPY/URETEROSCOPY/HOLMIUM LASER/STENT PLACEMENT Right 10/09/2017   Procedure: CYSTOSCOPY/URETEROSCOPY/HOLMIUM LASER/STENT PLACEMENT;  Surgeon: Abbie Sons, MD;  Location: [facility] ORS;  Service: Urology;  Laterality: Right;  right Stent exchange   ESOPHAGOGASTRODUODENOSCOPY (EGD) WITH PROPOFOL N/A 09/16/2018   Procedure: ESOPHAGOGASTRODUODENOSCOPY (EGD) WITH PROPOFOL;  Surgeon: Lin Landsman, MD;  Location: [place];  Service: Gastroenterology;  Laterality: N/A;   EYE SURGERY       reports that she quit smoking about 28 years ago. Her smoking use included cigarettes. She has a 20.00 pack-year smoking history. She has never used smokeless tobacco. She reports that she does not drink alcohol and does not use drugs.  Allergies  Allergen Reactions   Ace Inhibitors Hives   Beta Adrenergic Blockers     Junctional bradycardia   Gabapentin Hives   Lisinopril Hives   Lyrica [Pregabalin] Hives   Shrimp [Shellfish Allergy] Swelling    Swelling of the lips    Family History  Problem Relation Age of Onset   Other Mother        unknown medical history   Other Father        unknown medical history      Prior to Admission medications   Medication Sig Start Date End Date Taking? Authorizing Provider  acetaminophen (TYLENOL) 500 MG tablet Take 1-2 tablets (500-1,000 mg total) by mouth every 6 (six) hours as needed for mild pain or fever. Do not take more than 4 grams a day 05/25/20  Yes Rizwan, Eunice Blase, MD  albuterol (VENTOLIN HFA) 108 (90 Base) MCG/ACT inhaler Inhale 2 puffs into the lungs every 6 (six) hours as needed for wheezing or shortness of breath.    Yes [provider]  allopurinol (ZYLOPRIM) 100 MG tablet Take 1 tablet by mouth daily.   Yes [provider]  apixaban (ELIQUIS) 5 MG TABS tablet Take 1 tablet by mouth twice daily 10/25/20  Yes Gollan, Kathlene November, MD  atorvastatin (LIPITOR) 10 MG tablet Take 1 tablet by mouth daily. 12/17/20  Yes [provider]  esomeprazole (NEXIUM) 40 MG capsule Take 40 mg by mouth daily. 12/17/18  Yes [provider]  Ferrous Sulfate (IRON) 325 (65 Fe) MG TABS Take 1 tablet by mouth daily. 09/14/17  Yes [provider]  fluticasone (FLONASE) 50 MCG/ACT nasal spray Place 2 sprays into both nostrils daily. 11/15/18  Yes [provider]  Fluticasone-Umeclidin-Vilant 100-62.5-25 MCG/INH AEPB Inhale 1 puff into the lungs daily. 12/25/17  Yes [provider]  furosemide (LASIX) 40 MG tablet Take 1 tablet (40 mg total) by mouth daily. 06/21/20 06/21/21 Yes Val Riles, MD  hydrochlorothiazide (HYDRODIURIL) 25 MG tablet Take 25 mg by mouth daily. 08/28/20  Yes [provider]  insulin glargine (LANTUS SOLOSTAR) 100 UNIT/ML Solostar Pen Inject 38 Units into the skin daily. 11/09/20  Yes Danford, Suann Larry, MD  insulin lispro (HUMALOG) 100 UNIT/ML KwikPen Inject 10 Units into the skin in the morning, at noon, and at bedtime. 01/22/21  Yes [provider]  ipratropium (ATROVENT) 0.06 % nasal spray Place 2 sprays into both nostrils 4 (four) times daily. 05/28/20  Yes Margarette Canada, NP  ipratropium-albuterol (DUONEB) 0.5-2.5 (3) MG/3ML SOLN Take 3 mLs by nebulization 4 (four) times daily as needed. 10/20/20  Yes [provider]  losartan (COZAAR) 50 MG tablet Take 50 mg by mouth daily. 01/16/21  Yes [provider]  melatonin 5 MG TABS Take 5 mg by mouth at bedtime. 11/10/20  Yes [provider]  metFORMIN (GLUCOPHAGE) 500 MG tablet Take 1,000 mg by mouth 2 (two) times daily.   Yes [provider]  montelukast (SINGULAIR) 10 MG tablet Take 10 mg by mouth at bedtime.   Yes [provider]  vitamin B-12 (CYANOCOBALAMIN) 500 MCG tablet Take 500 mcg by mouth daily.   Yes [provider]  VITAMIN D, CHOLECALCIFEROL, PO Take 1 tablet by mouth daily.   Yes [provider]  benzonatate (TESSALON) 100 MG capsule Take  2 capsules (200 mg total) by mouth every 8 (eight) hours. Patient not taking: No sig reported 07/14/20   Margarette Canada, NP  Dulaglutide 3 MG/0.5ML SOPN Inject 3 mg into the skin every Wednesday. 09/30/20   [provider]  guaiFENesin-dextromethorphan (ROBITUSSIN DM) 100-10 MG/5ML syrup Take 10 mLs by mouth every 4 (four) hours as needed for cough. Patient not taking: Reported on 02/01/2021 11/09/20   Edwin Dada, MD  metFORMIN (GLUCOPHAGE) 500 MG tablet Take 1 tablet by mouth in the morning and at bedtime. Patient not taking: Reported on 02/01/2021 01/20/21 04/20/21  [provider]  oxyCODONE (OXY IR/ROXICODONE) 5 MG immediate release tablet Take 1 tablet (5 mg total) by mouth every 4 (four) hours as needed for severe pain. Patient not taking: Reported on 02/01/2021 11/09/20   Edwin Dada, MD  promethazine-dextromethorphan (PROMETHAZINE-DM) 6.25-15 MG/5ML syrup Take 5 mLs by mouth 4 (four) times daily as needed. Patient not taking: No sig reported 07/14/20   Margarette Canada, NP    Physical Exam: Vitals:   02/01/21 1252 02/01/21 1312 02/01/21 1457 02/01/21 1635  BP: (!) 124/95  (!) 119/50 (!) 124/52  Pulse: 62   (!) 56  Resp: (!) 22   (!) 22  Temp: (!) 97.5 F (36.4 C)     TempSrc: Oral     SpO2: 100%   100%  Weight:  117.9 kg    Height:  5\' 3"  (1.6 m)       Vitals:   02/01/21 1252 02/01/21 1312 02/01/21 1457 02/01/21 1635  BP: (!) 124/95  (!) 119/50 (!) 124/52  Pulse: 62   (!) 56  Resp: (!) 22   (!) 22  Temp: (!) 97.5 F (36.4 C)     TempSrc: Oral     SpO2: 100%   100%  Weight:  117.9 kg    Height:  5\' 3"  (1.6 m)        Constitutional: Alert and oriented x 3 . Not in any apparent distress HEENT:      Head: Normocephalic and atraumatic.         Eyes: PERLA, EOMI, Conjunctivae are normal. Sclera is non-icteric.       Mouth/Throat: Mucous membranes are moist.       Neck: Supple with no signs of meningismus. Cardiovascular: Bradycardia. No  murmurs, gallops, or rubs. 2+ symmetrical distal pulses are present . No JVD.  Trace LE edema Respiratory: Respiratory effort normal .bilateral air entry in both lung fields . No wheezes, crackles, or rhonchi.  Gastrointestinal: Soft, non tender, and non distended with positive bowel sounds.  Central adiposity Genitourinary: No CVA tenderness.  Foley catheter in place Musculoskeletal: Nontender with normal range of motion in all extremities. No cyanosis, or erythema of extremities. Neurologic:  Face is symmetric. Moving all extremities. No gross focal neurologic deficits . Skin: Skin is warm, dry.  No rash or ulcers Psychiatric: Mood and affect are normal    Labs on Admission: I have personally reviewed following labs and imaging studies  CBC: Recent Labs  Lab 02/01/21 1627  WBC 11.3*  HGB 9.1*  HCT 29.6*  MCV 82.0  PLT 509   Basic Metabolic Panel: Recent Labs  Lab 02/01/21 1256  NA 128*  K 5.2*  CL 97*  CO2 24  GLUCOSE 172*  BUN 48*  CREATININE 1.28*  CALCIUM 8.9   GFR: Estimated Creatinine Clearance: 46.4 mL/min (A) (by C-G formula based on SCr of 1.28 mg/dL (H)). Liver Function Tests: No results for input(s): AST, ALT, ALKPHOS, BILITOT, PROT, ALBUMIN in the last 168 hours. No results for input(s): LIPASE, AMYLASE in the last 168 hours. No results for input(s): AMMONIA in the last 168 hours. Coagulation Profile: No results for input(s): INR, PROTIME in the last 168 hours. Cardiac Enzymes: No results for input(s): CKTOTAL, CKMB, CKMBINDEX, TROPONINI in the last 168 hours. BNP (last 3 results) No results for input(s): PROBNP in the last 8760 hours. HbA1C: No results for input(s): HGBA1C in the last 72 hours. CBG: No results for input(s): GLUCAP in the last 168 hours. Lipid Profile: No results for input(s): CHOL, HDL, LDLCALC, TRIG, CHOLHDL, LDLDIRECT in the last 72 hours. Thyroid Function Tests: No results for input(s): TSH, T4TOTAL, FREET4, T3FREE, THYROIDAB in  the last 72 hours. Anemia Panel: No results for input(s): VITAMINB12, FOLATE, FERRITIN, TIBC, IRON, RETICCTPCT in the last 72 hours. Urine analysis:    Component Value Date/Time   COLORURINE YELLOW (A) 10/29/2020 1947   APPEARANCEUR HAZY (A) 10/29/2020 1947   APPEARANCEUR Cloudy (A) 09/28/2017 1338   LABSPEC 1.011 10/29/2020 1947   LABSPEC 1.024 04/02/2014 0517   PHURINE 5.0 10/29/2020 1947   GLUCOSEU NEGATIVE 10/29/2020 1947   GLUCOSEU >=500 04/02/2014 0517   HGBUR NEGATIVE 10/29/2020 [place] 10/29/2020 1947   BILIRUBINUR Negative 09/28/2017 1338   BILIRUBINUR Negative 04/02/2014 [place] 10/29/2020 1947   PROTEINUR 30 (A) 10/29/2020 1947   NITRITE POSITIVE (A) 10/29/2020 1947   LEUKOCYTESUR SMALL (A) 10/29/2020 1947   LEUKOCYTESUR Trace 04/02/2014 0517    Radiological Exams on Admission: DG Chest 2 View  Result Date: 02/01/2021 CLINICAL DATA:  Shortness of breath. EXAM: CHEST - 2 VIEW COMPARISON:  November 02, 2020. FINDINGS: Stable cardiomegaly. Mild bibasilar atelectasis or edema is noted. Bony thorax is unremarkable. IMPRESSION: Mild bibasilar atelectasis or edema is noted. Electronically Signed   By: Marijo Conception M.D.   On: 02/01/2021 14:19     Assessment/Plan Principal Problem:   Acute diastolic CHF (congestive heart failure) (HCC) Active Problems:   COPD (chronic obstructive pulmonary disease) (HCC)   Hypertension   CKD (chronic kidney disease), stage IIIa   Morbid obesity (HCC)   Aortic stenosis, moderate   AF (paroxysmal atrial fibrillation) ([place])     Patient is a 75 year old female who presents to the ER for evaluation of worsening shortness of breath from her baseline and reduced urine output despite taking her diuretic therapy.   Acute on chronic diastolic dysfunction CHF Patient's last 2D echocardiogram shows  an LVEF of 60 to 65% with mild to moderate AS She presents for evaluation of worsening shortness of breath  from her baseline associated with lower extremity swelling and reduced urine output Patient received a dose of IV Lasix in the ER Will obtain CT scan of the chest without contrast for further evaluation before continuing further diuresis Peripheral edema in her lower extremities might be related to underlying liver disease Continue Cozaar       COPD (chronic obstructive pulmonary disease) with chronic respiratory failure Not acutely exacerbated Continue as needed bronchodilator therapy as well as inhaled steroids Continue oxygen supplementation at 3 L to maintain pulse oximetry greater than 94%        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       Hyponatremia, likely chronic Sodium 128.  Baseline 125-132 Suspect chronic and most likely related to heart failure We will get serum and urine osmolality and urine sodium       Atrial fibrillation, paroxysmal (HCC) Patient currently in sinus Continue Eliquis as primary prophylaxis for an acute stroke         DVT prophylaxis: Apixaban Code Status: full code  Family Communication: Greater than 50% of time was spent discussing patient's condition and plan of care with her at the bedside.  All questions and concerns have been addressed.  She verbalizes understanding and agrees with the plan. Disposition Plan: Back to previous home environment Consults called: none  Status:At the time of admission, it appears that the appropriate admission status for this patient is inpatient. This is judged to be reasonable and necessary to provide the required intensity of service to ensure the patient's safety given the presenting symptoms, physical exam findings, and initial radiographic and laboratory data in the context of their comorbid conditions. Patient requires inpatient status  due to high intensity of service, high risk for further deterioration and high frequency of surveillance required.     Collier Bullock MD [facility]     02/01/2021, 4:56 PM

## 2021-02-01 NOTE — ED Notes (Signed)
Patient to CT at this time

## 2021-02-01 NOTE — ED Triage Notes (Signed)
Pt to ED [facility] from home for shob for the past few days, wears 3L [place] chronic.  +right sided chest pain that started a few days ago. Denies n/v Reports no urine output in 3 days, takes diuretics.

## 2021-02-01 NOTE — Plan of Care (Signed)
  Problem: Education: Goal: Ability to demonstrate management of disease process will improve Outcome: Progressing Goal: Ability to verbalize understanding of medication therapies will improve Outcome: Progressing Goal: Individualized Educational Video(s) Outcome: Progressing   Problem: Education: Goal: Ability to verbalize understanding of medication therapies will improve Outcome: Progressing   Problem: Education: Goal: Ability to demonstrate management of disease process will improve Outcome: Progressing Goal: Ability to verbalize understanding of medication therapies will improve Outcome: Progressing Goal: Individualized Educational Video(s) Outcome: Progressing

## 2021-02-01 NOTE — ED Provider Notes (Signed)
  Emergency Medicine Provider Triage Evaluation Note  Karen [name] , a 75 y.o.female,  was evaluated in triage.  Pt complains of shortness of breath.  Patient states that she has been feeling short of breath for the past 3 days.  Endorses increased swelling in her lower extremities, as well as inability urinate for the past 3 days.  Patient states that she feels like she has a full bladder but cannot get anything to come out.  She states that this is happened before.  She states that this feels like the issues that she has had before related to her heart failure.   Review of Systems  Positive: Shortness of breath, inability to urinate. Negative: Denies fever, chest pain, vomiting  Physical Exam   Vitals:   02/01/21 1252  BP: (!) 124/95  Pulse: 62  Resp: (!) 22  Temp: (!) 97.5 F (36.4 C)  SpO2: 100%   Gen:   Awake, uncomfortable. Resp:  Labored breathing. MSK:   Moves extremities without difficulty  Other:  3+ pitting edema in both lower extremities.  Suprapubic tenderness  Medical Decision Making  Given the patient's initial medical screening exam, the following diagnostic evaluation has been ordered. The patient will be placed in the appropriate treatment space, once one is available, to complete the evaluation and treatment. I have discussed the plan of care with the patient and I have advised the patient that an ED physician or mid-level practitioner will reevaluate their condition after the test results have been received, as the results may give them additional insight into the type of treatment they may need.    Diagnostics: Labs, EKG, CXR, UA  Treatments: Foley catheter insertion.   Teodoro Spray, [place] 02/01/21 1311    Vladimir Crofts, MD 02/01/21 1640

## 2021-02-01 NOTE — ED Provider Notes (Signed)
[facility] Emergency Department Provider Note  ____________________________________________   Event Date/Time   First MD Initiated Contact with Patient 02/01/21 1340     (approximate)  I have reviewed the triage vital signs and the nursing notes.   HISTORY  Chief Complaint Shortness of Breath    HPI Karen [name] is a 75 y.o. female with past medical history as below here with shortness of breath.  Patient states that over the last week, she has had progressively worsening shortness of breath.  She states her symptoms started with significantly decreased urinary output over the last 3 days.  Over the the last 2 days, she has had worsening lower extremity edema, shortness of breath, and orthopnea.  She has had some intermittent chest pressure.  She has felt like her heart has been beating more quickly.  She feels like she has had lightheadedness with any kind of exertion and is having difficulty walking.  No chest pain at rest.  No other complaints.  No fevers or chills.  No sputum production.    Past Medical History:  Diagnosis Date   (HFpEF) heart failure with preserved ejection fraction ([place])    a. 2017 Echo: EF 50%; b. 06/2018 Echo: EF 50-55%; c. 08/2018 Echo: EF 50-55%, Nl RV fxn; d. 09/2019 Echo: EF 55-60%, no rwma, mild LVH, Gr1 DD, nl RV size/fxn, PASP 63.27mmHg. Mildly dil LA. Triv MR. Mod AS (AoV 0.94cm^2 VTI; mean grad 17.59mmHg).   Acute on chronic respiratory failure with hypoxia and hypercapnia (HCC) 01/07/2015   Anemia    Asterixis 01/07/2015   Asthma    Cataract    CKD (chronic kidney disease), stage III (HCC)    COPD (chronic obstructive pulmonary disease) ([place])    a. 06/2018 tobacco use, home 3L oxygen    Diabetes mellitus without complication ([name])    a. 09/2019 A1C 8.8   Edema, peripheral 04/20/2014   GI bleed 06/28/2019   History of kidney stones    Hyperlipidemia    Hypertension    Iron deficiency anemia 06/22/2014   Junctional  bradycardia    a. In setting of beta blocker therapy.   Leucocytosis 10/19/2015   Moderate aortic stenosis    a.  09/2019 Echo: Mod AS (AoV 0.94cm^2 VTI; mean grad 17.18mmHg).   Morbid obesity ([place])    Overactive bladder    Primary osteoarthritis of right knee 09/01/2016   Sciatica 01/07/2015    Patient Active Problem List   Diagnosis Date Noted   Pneumonia due to COVID-19 virus 10/29/2020   Pulmonary nodules/lesions, multiple 10/29/2020   Aortic stenosis, moderate    AF (paroxysmal atrial fibrillation) (HCC)    Gastroesophageal reflux disease without esophagitis    Acute CHF (congestive heart failure) ([place]) 06/17/2020   Chronic respiratory failure with hypercapnia ([place]) 05/21/2020   Severe sepsis ([place]) 05/21/2020   UTI (urinary tract infection) 05/21/2020   Hyperglycemia due to type 2 diabetes mellitus ([place]) 05/21/2020   Abnormal CT of liver 05/21/2020   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 05/21/2020   Morbid obesity ([place]) 05/08/2020   Acute hip pain, left 10/23/2019   Lumbar stenosis with neurogenic claudication 10/23/2019   COPD with acute exacerbation ([place]) 81/19/1478   Acute diastolic CHF (congestive heart failure) ([place]) 09/28/2019   (HFpEF) heart failure with preserved ejection fraction ([place]) 09/27/2019   Rectal bleeding 06/28/2019   Hypokalemia 06/28/2019   Hyponatremia 06/28/2019   Type 2 diabetes mellitus with hyperlipidemia ([place]) 06/28/2019   CKD (  chronic kidney disease), stage IIIa 06/28/2019   Atrial fibrillation, chronic (HCC) 06/28/2019   Pulmonary edema 02/27/2019   Bradycardia 02/24/2019   Chronic respiratory failure with hypoxia ([place]) 02/23/2019   Atypical chest pain 02/13/2019   Osteopenia of neck of left femur 11/20/2018   AVM (arteriovenous malformation) of small bowel, acquired    Acute gastric ulcer with hemorrhage    Chronic diastolic heart failure ([place]) 08/22/2018   Diarrhea 08/22/2018   Junctional bradycardia    Acute on chronic heart failure  with preserved ejection fraction (HFpEF) ([place])    AKI (acute kidney injury) ([place])    Symptomatic bradycardia 08/05/2018   Acute on chronic respiratory failure ([place]) 06/25/2018   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 diabetes mellitus ([place]) 01/25/2018   History of non anemic vitamin B12 deficiency 01/25/2018   Personal history of kidney stones 11/12/2017   Urge incontinence 11/12/2017   History of leukocytosis 09/17/2017   Right ureteral stone 09/15/2017   Acute GI bleeding    GI bleed 08/26/2017   Arthritis 08/10/2017   Stage 4 chronic kidney disease ([place]) 08/10/2017   COPD (chronic obstructive pulmonary disease) ([place]) 08/10/2017   Diabetes mellitus type 2, uncomplicated ([place]) 03/54/6568   Hypertension 08/10/2017   Obesity (BMI 35.0-39.9 without comorbidity) 04/11/2017   Primary osteoarthritis of right knee 09/01/2016   Leucocytosis 10/19/2015   Asterixis 01/07/2015   Acute on chronic respiratory failure with hypoxia and hypercapnia (HCC) 01/07/2015   Sciatica 01/07/2015   Weakness 01/07/2015   Chronic 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 01/04/2015   Iron deficiency anemia 06/22/2014   Microalbuminuria 06/22/2014   CHF (congestive heart failure) ([place]) 04/20/2014   Edema, peripheral 04/20/2014    Past Surgical History:  Procedure Laterality Date   APPENDECTOMY     CESAREAN SECTION     x3   CHOLECYSTECTOMY     COLONOSCOPY WITH PROPOFOL N/A 08/28/2017   Procedure: COLONOSCOPY WITH PROPOFOL;  Surgeon: Lucilla Lame, MD;  Location[facility] ENDOSCOPY;  Service: Endoscopy;  Laterality: N/A;   COLONOSCOPY WITH PROPOFOL N/A 08/29/2017   Procedure: COLONOSCOPY WITH PROPOFOL;  Surgeon: Lucilla Lame, MD;  Location: [facility] ENDOSCOPY;  Service: Endoscopy;  Laterality: N/A;   CYSTOSCOPY W/ URETERAL STENT PLACEMENT Right 09/15/2017   Procedure: CYSTOSCOPY WITH RETROGRADE PYELOGRAM/URETERAL STENT PLACEMENT;  Surgeon: Cleon Gustin, MD;  Location: [facility] ORS;  Service:  Urology;  Laterality: Right;   CYSTOSCOPY/URETEROSCOPY/HOLMIUM LASER/STENT PLACEMENT Right 10/09/2017   Procedure: CYSTOSCOPY/URETEROSCOPY/HOLMIUM LASER/STENT PLACEMENT;  Surgeon: Abbie Sons, MD;  Location: [facility] ORS;  Service: Urology;  Laterality: Right;  right Stent exchange   ESOPHAGOGASTRODUODENOSCOPY (EGD) WITH PROPOFOL N/A 09/16/2018   Procedure: ESOPHAGOGASTRODUODENOSCOPY (EGD) WITH PROPOFOL;  Surgeon: Lin Landsman, MD;  Location: [place];  Service: Gastroenterology;  Laterality: N/A;   EYE SURGERY      Prior to Admission medications   Medication Sig Start Date End Date Taking? Authorizing Provider  acetaminophen (TYLENOL) 500 MG tablet Take 1-2 tablets (500-1,000 mg total) by mouth every 6 (six) hours as needed for mild pain or fever. Do not take more than 4 grams a day 05/25/20   Debbe Odea, MD  albuterol (VENTOLIN HFA) 108 (90 Base) MCG/ACT inhaler Inhale 2 puffs into the lungs every 6 (six) hours as needed for wheezing or shortness of breath.     [provider]  apixaban (ELIQUIS) 5 MG TABS tablet Take 1 tablet by mouth twice daily 10/25/20   [name], MD  atorvastatin (LIPITOR) 10 MG tablet  Take 10 mg by mouth daily.     [provider]  benzonatate (TESSALON) 100 MG capsule Take 2 capsules (200 mg total) by mouth every 8 (eight) hours. Patient not taking: No sig reported 07/14/20   Margarette Canada, NP  Dulaglutide 3 MG/0.5ML SOPN Inject 3 mg into the skin every Wednesday. 09/30/20   [provider]  esomeprazole (NEXIUM) 40 MG capsule Take 40 mg by mouth daily. 12/17/18   [provider]  Ferrous Sulfate (IRON) 325 (65 Fe) MG TABS Take 1 tablet by mouth daily. 09/14/17   [provider]  fluticasone (FLONASE) 50 MCG/ACT nasal spray Place 2 sprays into both nostrils daily. 11/15/18   [provider]  Fluticasone-Umeclidin-Vilant 100-62.5-25 MCG/INH AEPB Inhale 1 puff into the lungs daily. 12/25/17   [provider]  furosemide (LASIX) 40 MG tablet Take 1 tablet (40 mg total) by mouth daily. 06/21/20 06/21/21  Val Riles, MD  guaiFENesin-dextromethorphan (ROBITUSSIN DM) 100-10 MG/5ML syrup Take 10 mLs by mouth every 4 (four) hours as needed for cough. 11/09/20   Danford, Suann Larry, MD  hydrochlorothiazide (HYDRODIURIL) 25 MG tablet Take 25 mg by mouth daily. 08/28/20   [provider]  insulin glargine (LANTUS SOLOSTAR) 100 UNIT/ML Solostar Pen Inject 38 Units into the skin daily. 11/09/20   Danford, Suann Larry, MD  ipratropium (ATROVENT) 0.06 % nasal spray Place 2 sprays into both nostrils 4 (four) times daily. 05/28/20   Margarette Canada, NP  ipratropium-albuterol (DUONEB) 0.5-2.5 (3) MG/3ML SOLN Take 3 mLs by nebulization 4 (four) times daily as needed. 10/20/20   [provider]  metFORMIN (GLUCOPHAGE) 500 MG tablet Take 1,000 mg by mouth 2 (two) times daily.    [provider]  montelukast (SINGULAIR) 10 MG tablet Take 10 mg by mouth at bedtime.    [provider]  oxyCODONE (OXY IR/ROXICODONE) 5 MG immediate release tablet Take 1 tablet (5 mg total) by mouth every 4 (four) hours as needed for severe pain. 11/09/20   Danford, Suann Larry, MD  promethazine-dextromethorphan (PROMETHAZINE-DM) 6.25-15 MG/5ML syrup Take 5 mLs by mouth 4 (four) times daily as needed. Patient not taking: No sig reported 07/14/20   Margarette Canada, NP  vitamin B-12 (CYANOCOBALAMIN) 500 MCG tablet Take 500 mcg by mouth daily.    [provider]    Allergies Ace inhibitors, Beta adrenergic blockers, Gabapentin, Lisinopril, Lyrica [pregabalin], and Shrimp [shellfish allergy]  Family History  Problem Relation Age of Onset   Other Mother        unknown medical history   Other Father        unknown medical history    Social History Social History   Tobacco Use   Smoking status: Former    Packs/day: 1.00    Years: 20.00    Pack years: 20.00    Types: Cigarettes     Quit date: 12/04/1992    Years since quitting: 28.1   Smokeless tobacco: Never  Vaping Use   Vaping Use: Never used  Substance Use Topics   Alcohol use: No   Drug use: No    Review of Systems  Review of Systems  Constitutional:  Positive for fatigue. Negative for fever.  HENT:  Negative for congestion and sore throat.   Eyes:  Negative for visual disturbance.  Respiratory:  Positive for shortness of breath. Negative for cough.   Cardiovascular:  Positive for leg swelling. Negative for chest pain.  Gastrointestinal:  Negative for abdominal pain, diarrhea, nausea and vomiting.  Genitourinary:  Negative for flank pain.  Musculoskeletal:  Negative for back pain and neck pain.  Skin:  Negative for rash and wound.  Neurological:  Positive for weakness.  All other systems reviewed and are negative.   ____________________________________________  PHYSICAL EXAM:      VITAL SIGNS: ED Triage Vitals  Enc Vitals Group     BP 02/01/21 1252 (!) 124/95     Pulse Rate 02/01/21 1252 62     Resp 02/01/21 1252 (!) 22     Temp 02/01/21 1252 (!) 97.5 F (36.4 C)     Temp Source 02/01/21 1252 Oral     SpO2 02/01/21 1252 100 %     Weight 02/01/21 1312 260 lb (117.9 kg)     Height 02/01/21 1312 5\' 3"  (1.6 m)     Head Circumference --      Peak Flow --      Pain Score 02/01/21 1253 8     Pain Loc --      Pain Edu? --      Excl. in [place]? --      Physical Exam Vitals and nursing note reviewed.  Constitutional:      General: She is not in acute distress.    Appearance: She is well-developed.  HENT:     Head: Normocephalic and atraumatic.  Eyes:     Conjunctiva/sclera: Conjunctivae normal.  Cardiovascular:     Rate and Rhythm: Normal rate and regular rhythm.     Heart sounds: Normal heart sounds. No murmur heard.   No friction rub.  Pulmonary:     Effort: Pulmonary effort is normal. No respiratory distress.     Breath sounds: Examination of the right-lower field reveals rales.  Examination of the left-lower field reveals rales. Rales present. No wheezing.  Abdominal:     General: There is no distension.     Palpations: Abdomen is soft.     Tenderness: There is no abdominal tenderness.  Musculoskeletal:     Cervical back: Neck supple.     Right lower leg: Edema (2+) present.     Left lower leg: Edema (2+) present.  Skin:    General: Skin is warm.     Capillary Refill: Capillary refill takes less than 2 seconds.  Neurological:     Mental Status: She is alert and oriented to person, place, and time.     Motor: No abnormal muscle tone.      ____________________________________________   LABS (all labs ordered are listed, but only abnormal results are displayed)  Labs Reviewed  BASIC METABOLIC PANEL - Abnormal; Notable for the following components:      Result Value   Sodium 128 (*)    Potassium 5.2 (*)    Chloride 97 (*)    Glucose, Bld 172 (*)    BUN 48 (*)    Creatinine, Ser 1.28 (*)    GFR, Estimated 43 (*)    All other components within normal limits  BRAIN NATRIURETIC PEPTIDE - Abnormal; Notable for the following components:   B Natriuretic Peptide 935.1 (*)    All other components within normal limits  RESP PANEL BY RT-PCR (FLU A&B, COVID) ARPGX2  URINE CULTURE  TROPONIN I (HIGH SENSITIVITY)  TROPONIN I (HIGH SENSITIVITY)    ____________________________________________  EKG: Junctional rhythm, jugular rate 58.  QRS 76, QTc 475.  No acute ST elevations or depressions. ________________________________________  RADIOLOGY All imaging, including plain films, CT scans, and ultrasounds, independently reviewed by me, and interpretations  confirmed via formal radiology reads.  ED MD interpretation:   Chest x-ray: Mild bibasilar edema  Official radiology report(s): DG Chest 2 View  Result Date: 02/01/2021 CLINICAL DATA:  Shortness of breath. EXAM: CHEST - 2 VIEW COMPARISON:  November 02, 2020. FINDINGS: Stable cardiomegaly. Mild bibasilar  atelectasis or edema is noted. Bony thorax is unremarkable. IMPRESSION: Mild bibasilar atelectasis or edema is noted. Electronically Signed   By: Marijo Conception M.D.   On: 02/01/2021 14:19    ____________________________________________  PROCEDURES   Procedure(s) performed (including Critical Care):  .1-3 Lead EKG Interpretation Performed by: Duffy Bruce, MD Authorized by: Duffy Bruce, MD     Interpretation: normal     ECG rate:  60-80   ECG rate assessment: normal     Rhythm: sinus rhythm     Ectopy: none     Conduction: normal   Comments:     Indication: Shortness of breath  ____________________________________________  INITIAL IMPRESSION / MDM / ASSESSMENT AND PLAN / ED COURSE  As part of my medical decision making, I reviewed the following data within the [facility] notes reviewed and incorporated, Old chart reviewed, Notes from prior ED visits, and [place] Controlled Substance Database       *Karen [name] was evaluated in Emergency Department on 02/01/2021 for the symptoms described in the history of present illness. She was evaluated in the context of the global COVID-19 pandemic, which necessitated consideration that the patient might be at risk for infection with the SARS-CoV-2 virus that causes COVID-19. Institutional protocols and algorithms that pertain to the evaluation of patients at risk for COVID-19 are in a state of rapid change based on information released by regulatory bodies including the CDC and federal and state organizations. These policies and algorithms were followed during the patient's care in the ED.  Some ED evaluations and interventions may be delayed as a result of limited staffing during the pandemic.*     Medical Decision Making: 75 year old female here with shortness of breath, rales, and increased work of breathing.  Patient is overtly hypervolemic on exam with bilateral rales and pitting edema.  Chest x-ray shows  edema.  BNP markedly elevated at 935.  Troponin negative and EKG is nonischemic.  BMP shows slightly elevated creatinine although does not meet criteria for AKI.  Patient does have likely hypervolemic hyponatremia as well as hyperkalemia consistent with her decreased urine output.  She did not have any evidence of obstruction.  Foley catheter was placed in triage.  Will give Lasix and monitor urine output closely.  Will plan to admit to medicine.  May benefit from repeat echocardiogram and further evaluation.  No clinical evidence of DVT or PE.  ____________________________________________  FINAL CLINICAL IMPRESSION(S) / ED DIAGNOSES  Final diagnoses:  Acute congestive heart failure, unspecified heart failure type ([place])     MEDICATIONS GIVEN DURING THIS VISIT:  Medications  furosemide (LASIX) injection 40 mg (has no administration in time range)     ED Discharge Orders     None        Note:  This document was prepared using Dragon voice recognition software and may include unintentional dictation errors.   Duffy Bruce, MD 02/01/21 [phone]

## 2021-02-02 ENCOUNTER — Inpatient Hospital Stay: Payer: Medicare Other

## 2021-02-02 DIAGNOSIS — I5031 Acute diastolic (congestive) heart failure: Secondary | ICD-10-CM | POA: Diagnosis not present

## 2021-02-02 DIAGNOSIS — J432 Centrilobular emphysema: Secondary | ICD-10-CM

## 2021-02-02 DIAGNOSIS — N1831 Chronic kidney disease, stage 3a: Secondary | ICD-10-CM

## 2021-02-02 DIAGNOSIS — D649 Anemia, unspecified: Secondary | ICD-10-CM

## 2021-02-02 DIAGNOSIS — J9611 Chronic respiratory failure with hypoxia: Secondary | ICD-10-CM

## 2021-02-02 DIAGNOSIS — K7460 Unspecified cirrhosis of liver: Secondary | ICD-10-CM

## 2021-02-02 DIAGNOSIS — E1165 Type 2 diabetes mellitus with hyperglycemia: Secondary | ICD-10-CM

## 2021-02-02 DIAGNOSIS — I350 Nonrheumatic aortic (valve) stenosis: Secondary | ICD-10-CM | POA: Diagnosis not present

## 2021-02-02 DIAGNOSIS — I10 Essential (primary) hypertension: Secondary | ICD-10-CM

## 2021-02-02 DIAGNOSIS — G9341 Metabolic encephalopathy: Secondary | ICD-10-CM | POA: Diagnosis not present

## 2021-02-02 DIAGNOSIS — T83511A Infection and inflammatory reaction due to indwelling urethral catheter, initial encounter: Secondary | ICD-10-CM

## 2021-02-02 DIAGNOSIS — N390 Urinary tract infection, site not specified: Secondary | ICD-10-CM

## 2021-02-02 LAB — VITAMIN B12: Vitamin B-12: 479 pg/mL (ref 180–914)

## 2021-02-02 LAB — TSH: TSH: 3.489 u[IU]/mL (ref 0.350–4.500)

## 2021-02-02 LAB — URINALYSIS, MICROSCOPIC (REFLEX): WBC, UA: >50 WBC/hpf (ref 0–5)

## 2021-02-02 LAB — BLOOD GAS, VENOUS
Acid-Base Excess: 4.9 mmol/L — ABNORMAL HIGH (ref 0.0–2.0)
Bicarbonate: 30.8 mmol/L — ABNORMAL HIGH (ref 20.0–28.0)
O2 Saturation: 97 %
Patient temperature: 37
pCO2, Ven: 52 mmHg (ref 44.0–60.0)
pH, Ven: 7.38 (ref 7.250–7.430)
pO2, Ven: 92 mmHg — ABNORMAL HIGH (ref 32.0–45.0)

## 2021-02-02 LAB — URINALYSIS, ROUTINE W REFLEX MICROSCOPIC
Bilirubin Urine: NEGATIVE
Glucose, UA: NEGATIVE mg/dL
Ketones, ur: NEGATIVE mg/dL
Nitrite: NEGATIVE
Protein, ur: NEGATIVE mg/dL
Specific Gravity, Urine: 1.015 (ref 1.005–1.030)
pH: 5.5 (ref 5.0–8.0)

## 2021-02-02 LAB — BASIC METABOLIC PANEL
Anion gap: 4 — ABNORMAL LOW (ref 5–15)
BUN: 44 mg/dL — ABNORMAL HIGH (ref 8–23)
CO2: 27 mmol/L (ref 22–32)
Calcium: 8.6 mg/dL — ABNORMAL LOW (ref 8.9–10.3)
Chloride: 99 mmol/L (ref 98–111)
Creatinine, Ser: 1.21 mg/dL — ABNORMAL HIGH (ref 0.44–1.00)
GFR, Estimated: 46 mL/min — ABNORMAL LOW (ref >60)
Glucose, Bld: 145 mg/dL — ABNORMAL HIGH (ref 70–99)
Potassium: 4.8 mmol/L (ref 3.5–5.1)
Sodium: 130 mmol/L — ABNORMAL LOW (ref 135–145)

## 2021-02-02 LAB — CBC
HCT: 28 % — ABNORMAL LOW (ref 36.0–46.0)
Hemoglobin: 8.6 g/dL — ABNORMAL LOW (ref 12.0–15.0)
MCH: 24.8 pg — ABNORMAL LOW (ref 26.0–34.0)
MCHC: 30.7 g/dL (ref 30.0–36.0)
MCV: 80.7 fL (ref 80.0–100.0)
Platelets: 284 10*3/uL (ref 150–400)
RBC: 3.47 MIL/uL — ABNORMAL LOW (ref 3.87–5.11)
RDW: 18 % — ABNORMAL HIGH (ref 11.5–15.5)
WBC: 11.2 10*3/uL — ABNORMAL HIGH (ref 4.0–10.5)
nRBC: 0 % (ref 0.0–0.2)

## 2021-02-02 LAB — GLUCOSE, CAPILLARY
Glucose-Capillary: 150 mg/dL — ABNORMAL HIGH (ref 70–99)
Glucose-Capillary: 196 mg/dL — ABNORMAL HIGH (ref 70–99)
Glucose-Capillary: 227 mg/dL — ABNORMAL HIGH (ref 70–99)

## 2021-02-02 LAB — HEMOGLOBIN A1C
Hgb A1c MFr Bld: 10.1 % — ABNORMAL HIGH (ref 4.8–5.6)
Mean Plasma Glucose: 243 mg/dL

## 2021-02-02 LAB — AMMONIA: Ammonia: 36 umol/L — ABNORMAL HIGH (ref 9–35)

## 2021-02-02 MED ORDER — CHLORHEXIDINE GLUCONATE CLOTH 2 % EX PADS
6.0000 | MEDICATED_PAD | Freq: Every day | CUTANEOUS | Status: DC
  Administered 2021-02-02: 6 via TOPICAL

## 2021-02-02 MED ORDER — SODIUM CHLORIDE 0.9 % IV SOLN
2.0000 g | INTRAVENOUS | Status: DC
  Administered 2021-02-02: 2 g via INTRAVENOUS
  Filled 2021-02-02 (×2): qty 20

## 2021-02-02 MED ORDER — FUROSEMIDE 10 MG/ML IJ SOLN
40.0000 mg | Freq: Every day | INTRAMUSCULAR | Status: DC
  Administered 2021-02-02 – 2021-02-07 (×6): 40 mg via INTRAVENOUS
  Filled 2021-02-02 (×6): qty 4

## 2021-02-02 NOTE — Progress Notes (Signed)
PT Cancellation Note  Patient Details Name: Karen [name] MRN: [id number] DOB: 1945/04/17   Cancelled Treatment:    Reason Eval/Treat Not Completed: Other (comment). Pt currently out of room for [place]. Will re-attempt   Shyteria Lewis 02/02/2021, 2:00 PM Greggory Stallion, PT, DPT [phone]

## 2021-02-02 NOTE — Evaluation (Signed)
Physical Therapy Evaluation Patient Details Name: Karen [name] MRN: [id number] DOB: 17-Oct-1945 Today's Date: 02/02/2021  History of Present Illness  75 year old F with PMH of COPD, chronic hypoxic RF on 3 L, diastolic CHF, moderate aortic stenosis, A. fib, DM-2 and morbid obesity presenting with decreased urine output, SOB, DOE, orthopnea, edema, chest pressure, abdominal pain, distention and diarrhea, and admitted with working diagnosis of acute diastolic CHF.  Clinical Impression  Pt is a pleasant 75 year old female who was admitted for CHF exacerbation. Pt performs bed mobility with min A, transfers with mod a, and ambulation with min assist and RW. First attempt made without RW, however unable to take steps. Highly recommend continued use of RW at all time. Pt reports she typically uses rollator. Pt fatigues quickly and rates 9/10 on RPE scale with ambulation to recliner. Quick de-sat noted to 77% on 2L; increased to 4.5L and RN notified. Pt demonstrates deficits with strength/mobility/endurance. Currently not at baseline level. Would benefit from skilled PT to address above deficits and promote optimal return to PLOF; recommend transition to STR upon discharge from acute hospitalization.      Recommendations for follow up therapy are one component of a multi-disciplinary discharge planning process, led by the attending physician.  Recommendations may be updated based on patient status, additional functional criteria and insurance authorization.  Follow Up Recommendations Skilled nursing-short term rehab (<3 hours/day)    Assistance Recommended at Discharge Intermittent Supervision/Assistance  Functional Status Assessment Patient has had a recent decline in their functional status and demonstrates the ability to make significant improvements in function in a reasonable and predictable amount of time.  Equipment Recommendations   (TBD)    Recommendations for Other Services        Precautions / Restrictions Precautions Precautions: Fall Restrictions Weight Bearing Restrictions: No      Mobility  Bed Mobility Overal bed mobility: Needs Assistance Bed Mobility: Supine to Sit;Sit to Supine     Supine to sit: Min assist Sit to supine: Max assist   General bed mobility comments: takes extended time for transition to EOB    Transfers Overall transfer level: Needs assistance Equipment used: 1 person hand held assist Transfers: Sit to/from Stand Sit to Stand: Mod assist           General transfer comment: has difficulty and fatigues quickly. Reaching out for B UE. Returned to sit and further attempts with RW    Ambulation/Gait Ambulation/Gait assistance: Min Web designer (Feet): 3 Feet Assistive device: Rolling walker (2 wheels) Gait Pattern/deviations: Step-to pattern       General Gait Details: very cautious and effortful gait pattern over to recliner. FOrward flexed posture and fatigues quickly. All mobility performed on 2L of O2 with sats decreased to 77%. Increased to 4.5 and cues for pursed lip breahting for sats to improve to 91%.  Stairs            Wheelchair Mobility    Modified Rankin (Stroke Patients Only)       Balance Overall balance assessment: Needs assistance Sitting-balance support: Feet supported Sitting balance-Leahy Scale: Good     Standing balance support: Bilateral upper extremity supported Standing balance-Leahy Scale: Fair                               Pertinent Vitals/Pain Pain Assessment: No/denies pain    Home Living Family/patient expects to be discharged to:: Private residence Living Arrangements: Spouse/significant  other Available Help at Discharge: Family;Available 24 hours/day Type of Home: Mobile home Home Access: Ramped entrance       Home Layout: One level Home Equipment: Grab bars - toilet;Grab bars - tub/shower;Rollator (4 wheels)      Prior Function Prior  Level of Function : Independent/Modified Independent             Mobility Comments: Pt reports mod I with use of RW and ambulating only short distance at baseline. ADLs Comments: Pt reports independent with self care tasks and her husband performing IADL tasks.     Hand Dominance   Dominant Hand: Right    Extremity/Trunk Assessment   Upper Extremity Assessment Upper Extremity Assessment: Generalized weakness    Lower Extremity Assessment Lower Extremity Assessment: Generalized weakness (B LE grossly 3+/5)       Communication   Communication: No difficulties  Cognition Arousal/Alertness: Awake/alert Behavior During Therapy: WFL for tasks assessed/performed Overall Cognitive Status: Within Functional Limits for tasks assessed                                          General Comments      Exercises     Assessment/Plan    PT Assessment Patient needs continued PT services  PT Problem List Decreased safety awareness;Decreased knowledge of use of DME;Decreased activity tolerance;Decreased strength;Decreased mobility;Cardiopulmonary status limiting activity       PT Treatment Interventions Gait training;DME instruction;Stair training;Therapeutic exercise;Balance training    PT Goals (Current goals can be found in the Care Plan section)  Acute Rehab PT Goals Patient Stated Goal: to go home PT Goal Formulation: With patient Time For Goal Achievement: 02/16/21 Potential to Achieve Goals: Good    Frequency Min 2X/week   Barriers to discharge        Co-evaluation               AM-PAC PT "6 Clicks" Mobility  Outcome Measure Help needed turning from your back to your side while in a flat bed without using bedrails?: A Little Help needed moving from lying on your back to sitting on the side of a flat bed without using bedrails?: A Little Help needed moving to and from a bed to a chair (including a wheelchair)?: A Lot Help needed standing up  from a chair using your arms (e.g., wheelchair or bedside chair)?: A Lot Help needed to walk in hospital room?: A Lot Help needed climbing 3-5 steps with a railing? : Total 6 Click Score: 13    End of Session Equipment Utilized During Treatment: Gait belt;Oxygen Activity Tolerance: Patient limited by fatigue Patient left: in chair;with chair alarm set Nurse Communication: Mobility status PT Visit Diagnosis: Muscle weakness (generalized) (M62.81);Unsteadiness on feet (R26.81);Difficulty in walking, not elsewhere classified (R26.2)    Time: 6283-6629 PT Time Calculation (min) (ACUTE ONLY): 17 min   Charges:   PT Evaluation $PT Eval Low Complexity: 1 Low PT Treatments $Gait Training: 8-22 mins        Greggory Stallion, PT, DPT [phone]   Karen [name] 02/02/2021, 4:37 PM

## 2021-02-02 NOTE — Progress Notes (Signed)
PROGRESS NOTE  Karen [name] NIO:[id number] DOB: [birth year]   PCP: Earlie Counts, FNP  Patient is from: Home.  DOA: 02/01/2021 LOS: 1  Chief complaints:  Chief Complaint  Patient presents with   Shortness of Breath     Brief Narrative / Interim history: 75 year old F with PMH of COPD, chronic hypoxic RF on 3 L, diastolic CHF, moderate aortic stenosis, A. fib, DM-2 and morbid obesity presenting with decreased urine output, SOB, DOE, orthopnea, edema, chest pressure, abdominal pain, distention and diarrhea, and admitted with working diagnosis of acute diastolic CHF.  BNP elevated to 935.  CXR with cardiomegaly.  Patient received IV Lasix in ER.  CT chest ordered on admission, and showed improved pleural effusion, cardiomegaly and probable liver cirrhosis.   The next day, patient was somewhat somnolent and hard to arise.  Urine culture with 100,000 colonies of GNR.  Subjective: Seen and examined earlier this morning.  Patient was somewhat somnolent.  She wakes to tactile stimuli but falls back to sleep.  Able to tell me her name and where she is.  Not able to provide further history.  No focal neurodeficit but limited exam.  Per RN, patient was awake and alert and ate her breakfast earlier in the morning.  Objective: Vitals:   02/02/21 0559 02/02/21 0610 02/02/21 0757 02/02/21 1122  BP:  (!) 121/53 111/60 (!) 110/59  Pulse:  71 (!) 54 (!) 53  Resp:  20 20   Temp:   98 F (36.7 C) 97.9 F (36.6 C)  TempSrc:    Oral  SpO2:  90% 94% 95%  Weight: 106 kg     Height:        Intake/Output Summary (Last 24 hours) at 02/02/2021 1251 Last data filed at 02/02/2021 0600 Gross per 24 hour  Intake --  Output 1500 ml  Net -1500 ml   Filed Weights   02/01/21 1312 02/01/21 1942 02/02/21 0559  Weight: 117.9 kg 106.7 kg 106 kg    Examination:  GENERAL: No apparent distress.  Nontoxic. HEENT: MMM.  Vision and hearing grossly intact.  NECK: Supple.  Difficult to assess JVD due to  body habitus. RESP: 95% on 3 L.  No IWOB.  Fair aeration but limited exam due to body habitus. CVS:  RRR. Heart sounds normal.  ABD/GI/GU: BS+. Abd soft, NTND.  MSK/EXT:  Moves extremities. No apparent deformity.  Trace BLE edema. SKIN: no apparent skin lesion or wound NEURO: Somewhat somnolent.  Wakes to tactile stimuli but falls back to sleep.  Barely follows command.  No apparent focal neuro deficit but limited exam due to body habitus. PSYCH: Somnolent.  No distress or agitation.  Procedures:  None  Microbiology summarized: [id number] and influenza PCR nonreactive. Urine culture with GNR.  Assessment & Plan: Respiratory distress/acute on chronic diastolic CHF-patient presents with SOB, DOE, orthopnea, edema, chest pressure, abdominal pain, distention and diarrhea.  BNP 935 (56 on 06/19/2020).  CXR with cardiomegaly.  TTE in 06/2020 with LVEF of 60 to 65%, mild to moderate AVS.  Received IV Lasix in ED. about 1.5 L UOP overnight.  Creatinine and sodium improved.  Difficult to assess fluid status due to body habitus.  She is also somnolent to provide history this morning. -Continue IV Lasix 40 mg daily -Monitor fluid status, renal functions and electrolytes. -Sodium and fluid restrictions  Acute metabolic encephalopathy: Unclear etiology of this.  No hypercapnia on VBG.  Ammonia slightly elevated.  TSH within normal.  Does not seem to  have focal neurodeficit but difficult exam.  UA and urine culture concerning for UTI.  She has mild leukocytosis.  She is diffusely tender on belly exam but no rebound or guarding.  CT chest raises concern for liver cirrhosis. -Start IV ceftriaxone -Check B1, B12, RPR -CT head if no improvement -Minimum oxygen to keep saturation above 88% given COPD -Abdominal ultrasound to assess ascites -Aspiration precaution  Possible UTI: UA and urine culture concerning.  Patient is somnolent to provide history.  She has mild leukocytosis but no fever.  Diffuse abdominal  tenderness but not localized to suprapubic area -Start IV ceftriaxone -Follow urine culture speciation and sensitivity  Chronic COPD/chronic hypoxic respiratory failure-on 3 L at baseline.  Does not seem to be in exacerbation. -Minimum oxygen to keep saturation above 88% regardless of home 3 L. -Continue inhalers and nebulizers  AKI/azotemia CKD-3A: Seems to be improving with diuretics. Recent Labs    10/29/20 1237 10/30/20 0416 10/31/20 7858 11/01/20 0521 11/02/20 0426 11/03/20 0421 11/04/20 0317 11/05/20 0351 02/01/21 1256 02/02/21 0616  BUN 36* 35* 37* 39* 36* 37* 39* 48* 48* 44*  CREATININE 1.54* 1.19* 0.94 0.92 1.00 1.02* 1.12* 1.10* 1.28* 1.21*  -Continue monitoring -Hold home losartan and HCTZ.  Normocytic anemia: No report of GI bleed Recent Labs    10/29/20 1237 10/30/20 0416 10/31/20 8502 11/01/20 0521 11/02/20 0426 11/03/20 0421 11/04/20 0317 11/05/20 0351 02/01/21 1627 02/02/21 0616  HGB 9.3* 9.0* 9.4* 8.8* 9.6* 9.4* 10.0* 9.6* 9.1* 8.6*  -Monitor H&H -Check anemia panel in the morning  Uncontrolled IDDM-2 with hyperglycemia and other complications. Recent Labs  Lab 02/01/21 2005 02/02/21 0759 02/02/21 1123  GLUCAP 128* 150* 196*  -Continue current insulin regimen -Continue statin.  Paroxysmal A. Fib: Currently in sinus rhythm.  Not on rate or rhythm control medication likely due to history of bradycardia.. -Continue home Eliquis.   Essential hypertension: Normotensive for most part. -Hold home losartan -Diuretics as above  Chronic back chronic pain: -Holding home medications given encephalopathy.  Hyponatremia: Improved.  Likely hypervolemic.  Could be due to losartan and HCTZ as well. -Continue IV Lasix -Holding losartan and HCTZ as above  Morbid obesity Body mass index is 41.4 kg/m.  -PT/OT eval       DVT prophylaxis:   apixaban (ELIQUIS) tablet 5 mg  Code Status: Full code Family Communication: Patient and/or RN. Available  if any question.  Level of care: Telemetry Cardiac Status is: Inpatient  Remains inpatient appropriate because: Remains inpatient given encephalopathy, CHF exacerbation requiring IV diuretics and possible UTI requiring IV antibiotics       Consultants:  None   Sch Meds:  Scheduled Meds:  allopurinol  100 mg Oral Daily   apixaban  5 mg Oral BID   atorvastatin  10 mg Oral Daily   Chlorhexidine Gluconate Cloth  6 each Topical Daily   ferrous sulfate  325 mg Oral Daily   fluticasone  2 spray Each Nare Daily   fluticasone furoate-vilanterol  1 puff Inhalation Daily   furosemide  40 mg Intravenous Daily   insulin aspart  0-20 Units Subcutaneous TID WC   insulin glargine-yfgn  30 Units Subcutaneous Daily   montelukast  10 mg Oral QHS   pantoprazole  40 mg Oral Daily   umeclidinium bromide  1 puff Inhalation Daily   vitamin B-12  500 mcg Oral Daily   Continuous Infusions: PRN Meds:.acetaminophen, ipratropium-albuterol, ondansetron **OR** ondansetron (ZOFRAN) IV  Antimicrobials: Anti-infectives (From admission, onward)    None  I have personally reviewed the following labs and images: CBC: Recent Labs  Lab 02/01/21 1627 02/02/21 0616  WBC 11.3* 11.2*  HGB 9.1* 8.6*  HCT 29.6* 28.0*  MCV 82.0 80.7  PLT 306 284   BMP &GFR Recent Labs  Lab 02/01/21 1256 02/02/21 0616  NA 128* 130*  K 5.2* 4.8  CL 97* 99  CO2 24 27  GLUCOSE 172* 145*  BUN 48* 44*  CREATININE 1.28* 1.21*  CALCIUM 8.9 8.6*   Estimated Creatinine Clearance: 46.1 mL/min (A) (by C-G formula based on SCr of 1.21 mg/dL (H)). Liver & Pancreas: No results for input(s): AST, ALT, ALKPHOS, BILITOT, PROT, ALBUMIN in the last 168 hours. No results for input(s): LIPASE, AMYLASE in the last 168 hours. Recent Labs  Lab 02/02/21 1154  AMMONIA 36*   Diabetic: No results for input(s): HGBA1C in the last 72 hours. Recent Labs  Lab 02/01/21 2005 02/02/21 0759 02/02/21 1123  GLUCAP 128* 150*  196*   Cardiac Enzymes: No results for input(s): CKTOTAL, CKMB, CKMBINDEX, TROPONINI in the last 168 hours. No results for input(s): PROBNP in the last 8760 hours. Coagulation Profile: No results for input(s): INR, PROTIME in the last 168 hours. Thyroid Function Tests: No results for input(s): TSH, T4TOTAL, FREET4, T3FREE, THYROIDAB in the last 72 hours. Lipid Profile: No results for input(s): CHOL, HDL, LDLCALC, TRIG, CHOLHDL, LDLDIRECT in the last 72 hours. Anemia Panel: No results for input(s): VITAMINB12, FOLATE, FERRITIN, TIBC, IRON, RETICCTPCT in the last 72 hours. Urine analysis:    Component Value Date/Time   COLORURINE YELLOW 02/02/2021 1200   APPEARANCEUR HAZY (A) 02/02/2021 1200   APPEARANCEUR Cloudy (A) 09/28/2017 1338   LABSPEC 1.015 02/02/2021 1200   LABSPEC 1.024 04/02/2014 0517   PHURINE 5.5 02/02/2021 1200   GLUCOSEU NEGATIVE 02/02/2021 1200   GLUCOSEU >=500 04/02/2014 0517   HGBUR TRACE (A) 02/02/2021 1200   BILIRUBINUR NEGATIVE 02/02/2021 1200   BILIRUBINUR Negative 09/28/2017 1338   BILIRUBINUR Negative 04/02/2014 [place] 02/02/2021 1200   PROTEINUR NEGATIVE 02/02/2021 1200   NITRITE NEGATIVE 02/02/2021 1200   LEUKOCYTESUR SMALL (A) 02/02/2021 1200   LEUKOCYTESUR Trace 04/02/2014 0517   Sepsis Labs: Invalid input(s): PROCALCITONIN, [place]  Microbiology: Recent Results (from the past 240 hour(s))  Resp Panel by RT-PCR (Flu A&B, Covid) Nasopharyngeal Swab     Status: None   Collection Time: 02/01/21 12:56 PM   Specimen: Nasopharyngeal Swab; Nasopharyngeal(NP) swabs in vial transport medium  Result Value Ref Range Status   SARS Coronavirus 2 by RT PCR NEGATIVE NEGATIVE Final    Comment: (NOTE) SARS-CoV-2 target nucleic acids are NOT DETECTED.  The SARS-CoV-2 RNA is generally detectable in upper respiratory specimens during the acute phase of infection. The lowest concentration of SARS-CoV-2 viral copies this assay can detect  is 138 copies/mL. A negative result does not preclude SARS-Cov-2 infection and should not be used as the sole basis for treatment or other patient management decisions. A negative result may occur with  improper specimen collection/handling, submission of specimen other than nasopharyngeal swab, presence of viral mutation(s) within the areas targeted by this assay, and inadequate number of viral copies(<138 copies/mL). A negative result must be combined with clinical observations, patient history, and epidemiological information. The expected result is Negative.  Fact Sheet for Patients:  EntrepreneurPulse.com.au  Fact Sheet for Healthcare Providers:  IncredibleEmployment.be  This test is no t yet approved or cleared by the [place] FDA and  has been authorized for detection and/or  diagnosis of SARS-CoV-2 by FDA under an Emergency Use Authorization (EUA). This EUA will remain  in effect (meaning this test can be used) for the duration of the COVID-19 declaration under Section 564(b)(1) of the Act, 21 U.S.C.section 360bbb-3(b)(1), unless the authorization is terminated  or revoked sooner.       Influenza A by PCR NEGATIVE NEGATIVE Final   Influenza B by PCR NEGATIVE NEGATIVE Final    Comment: (NOTE) The Xpert Xpress SARS-CoV-2/FLU/RSV plus assay is intended as an aid in the diagnosis of influenza from Nasopharyngeal swab specimens and should not be used as a sole basis for treatment. Nasal washings and aspirates are unacceptable for Xpert Xpress SARS-CoV-2/FLU/RSV testing.  Fact Sheet for Patients: EntrepreneurPulse.com.au  Fact Sheet for Healthcare Providers: IncredibleEmployment.be  This test is not yet approved or cleared by the [place] FDA and has been authorized for detection and/or diagnosis of SARS-CoV-2 by FDA under an Emergency Use Authorization (EUA). This EUA will remain in effect  (meaning this test can be used) for the duration of the COVID-19 declaration under Section 564(b)(1) of the Act, 21 U.S.C. section 360bbb-3(b)(1), unless the authorization is terminated or revoked.  Performed at [facility], [address], [place], [place] [address]   Urine Culture     Status: Abnormal (Preliminary result)   Collection Time: 02/01/21  1:42 PM   Specimen: Urine, Catheterized  Result Value Ref Range Status   Specimen Description   Final    URINE, CATHETERIZED Performed at [facility], [address], [place], [place] [address]    Special Requests   Final    NONE Performed at [facility], [address] [place], [place] [address]    Culture (A)  Final    >=100,000 COLONIES/mL GRAM NEGATIVE RODS SUSCEPTIBILITIES TO FOLLOW CULTURE REINCUBATED FOR BETTER GROWTH Performed at [facility], [place] [address], [place],  [address]    Report Status PENDING  Incomplete    Radiology Studies: DG Chest 2 View  Result Date: 02/01/2021 CLINICAL DATA:  Shortness of breath. EXAM: CHEST - 2 VIEW COMPARISON:  November 02, 2020. FINDINGS: Stable cardiomegaly. Mild bibasilar atelectasis or edema is noted. Bony thorax is unremarkable. IMPRESSION: Mild bibasilar atelectasis or edema is noted. Electronically Signed   By: Marijo Conception M.D.   On: 02/01/2021 14:19   CT CHEST WO CONTRAST  Result Date: 02/01/2021 CLINICAL DATA:  Pneumonia. Effusion or abscess suspected. Shortness of breath over the last few days. Right-sided chest pain. EXAM: CT CHEST WITHOUT CONTRAST TECHNIQUE: Multidetector CT imaging of the chest was performed following the standard protocol without IV contrast. COMPARISON:  Chest radiography same day.  CT 10/29/2020. FINDINGS: Cardiovascular: Cardiomegaly. No pericardial effusion. Coronary artery calcification is present. Aortic atherosclerotic calcification is present. Mediastinum/Nodes: Stable mildly prominent mediastinal  lymph nodes, likely reactive. Lungs/Pleura: Mild dependent atelectasis in the left lower lobe. No pleural effusion on the left. On the right, there is a small pleural effusion layering dependently, smaller than was seen in August. There is mild dependent atelectasis. Small subpleural nodule in the right middle lobe is unchanged and not significant. Upper Abdomen: No acute finding. Relative enlargement of the left lobe of the liver which could be seen with early cirrhosis. Musculoskeletal: Ordinary mild thoracic degenerative changes. IMPRESSION: Similar but improved appearance compared to the study of 10/29/2020. Small right effusion layering dependently with dependent atelectasis, improved since August. Cardiomegaly. Aortic Atherosclerosis (ICD10-I70.0). Coronary artery calcification. Mildly prominent mediastinal lymph nodes, probably reactive.  Probable cirrhosis of the liver. No significant pulmonary mass or nodule. Few small subpleural nodules not requiring further follow-up. Electronically Signed   By: Nelson Chimes M.D.   On: 02/01/2021 17:12    55 minutes with more than 50% spent in reviewing records, counseling patient/family and coordinating care.   Amyrah Pinkhasov T. [place]  If 7PM-7AM, please contact night-coverage [email or web address] 02/02/2021, 12:51 PM

## 2021-02-02 NOTE — Evaluation (Signed)
Occupational Therapy Evaluation Patient Details Name: Karen [name] MRN: [id number] DOB: [birth year] Today's Date: 02/02/2021   History of Present Illness 75 year old F with PMH of COPD, chronic hypoxic RF on 3 L, diastolic CHF, moderate aortic stenosis, A. fib, DM-2 and morbid obesity presenting with decreased urine output, SOB, DOE, orthopnea, edema, chest pressure, abdominal pain, distention and diarrhea, and admitted with working diagnosis of acute diastolic CHF.   Clinical Impression   Patient presenting with decreased Ind in self care,balance, functional mobility/transfers, endurance, and safety awareness. Patient reports living at home with husband and being independent with self care and mobility with use of RW. Pt endorses short distance ambulation of 30 feet or less at baseline. She is on 3 L via [place] at baseline. When entering the room, pt O2 sat at 85% on 3 Ls and needing to be turned up to 5Ls with supine >sit and then standing at EOB to be above 90%. RN notified. Pt needing min - max A with functional mobility and fatigues very quickly. She was unable to take steps once standing at returned to bed secondary to fatigue. Pt is very deconditioned. Patient will benefit from acute OT to increase overall independence in the areas of ADLs, functional mobility, and safety awareness in order to safely discharge to next venue of care.      Recommendations for follow up therapy are one component of a multi-disciplinary discharge planning process, led by the attending physician.  Recommendations may be updated based on patient status, additional functional criteria and insurance authorization.   Follow Up Recommendations  Skilled nursing-short term rehab (<3 hours/day)    Assistance Recommended at Discharge Frequent or constant Supervision/Assistance  Functional Status Assessment  Patient has had a recent decline in their functional status and demonstrates the ability to make significant  improvements in function in a reasonable and predictable amount of time.  Equipment Recommendations  Other (comment) (defer to next venue of care)       Precautions / Restrictions Precautions Precautions: Fall      Mobility Bed Mobility Overal bed mobility: Needs Assistance Bed Mobility: Supine to Sit;Sit to Supine     Supine to sit: Min assist Sit to supine: Max assist   General bed mobility comments: min A for trunk support to EOB with additional assistance for B LEs to return to bed.    Transfers Overall transfer level: Needs assistance Equipment used: 1 person hand held assist Transfers: Sit to/from Stand Sit to Stand: Mod assist           General transfer comment: mod lifting assistance to stand from edge of standard height bed      Balance Overall balance assessment: Needs assistance Sitting-balance support: Feet supported Sitting balance-Leahy Scale: Good     Standing balance support: Bilateral upper extremity supported Standing balance-Leahy Scale: Poor                             ADL either performed or assessed with clinical judgement   ADL Overall ADL's : Needs assistance/impaired     Grooming: Wash/dry hands;Wash/dry face;Sitting;Supervision/safety               Lower Body Dressing: Maximal assistance;Sit to/from stand                       Vision Patient Visual Report: No change from baseline  Pertinent Vitals/Pain Pain Assessment: No/denies pain     Hand Dominance Right   Extremity/Trunk Assessment Upper Extremity Assessment Upper Extremity Assessment: Generalized weakness   Lower Extremity Assessment Lower Extremity Assessment: Generalized weakness       Communication Communication Communication: No difficulties   Cognition Arousal/Alertness: Awake/alert Behavior During Therapy: WFL for tasks assessed/performed Overall Cognitive Status: Within Functional Limits for tasks assessed                                                   Home Living Family/patient expects to be discharged to:: Private residence Living Arrangements: Spouse/significant other Available Help at Discharge: Family;Available 24 hours/day Type of Home: Mobile home Home Access: Ramped entrance     Home Layout: One level     Bathroom Shower/Tub: Teacher, early years/pre: Handicapped height     Home Equipment: Conservation officer, nature (2 wheels);Grab bars - toilet;Grab bars - tub/shower          Prior Functioning/Environment Prior Level of Function : Independent/Modified Independent             Mobility Comments: Pt reports mod I with use of RW and ambulating only short distance at baseline. ADLs Comments: Pt reports independent with self care tasks and her husband performing IADL tasks.        OT Problem List: Decreased strength;Decreased activity tolerance;Impaired balance (sitting and/or standing);Decreased safety awareness;Cardiopulmonary status limiting activity      OT Treatment/Interventions: Self-care/ADL training;Therapeutic exercise;Therapeutic activities;Energy conservation;DME and/or AE instruction;Patient/family education;Manual therapy;Balance training    OT Goals(Current goals can be found in the care plan section) Acute Rehab OT Goals Patient Stated Goal: to return home OT Goal Formulation: With patient Time For Goal Achievement: 02/16/21 Potential to Achieve Goals: Good ADL Goals Pt Will Perform Grooming: standing;with min assist Pt Will Transfer to Toilet: with min assist;ambulating Pt Will Perform Toileting - Clothing Manipulation and hygiene: with min assist;sit to/from stand  OT Frequency: Min 2X/week   Barriers to D/C:    unsure if husband can provide physical assist          AM-PAC OT "6 Clicks" Daily Activity     Outcome Measure Help from another person eating meals?: None Help from another person taking care of personal  grooming?: A Little Help from another person toileting, which includes using toliet, bedpan, or urinal?: A Lot Help from another person bathing (including washing, rinsing, drying)?: A Lot Help from another person to put on and taking off regular upper body clothing?: A Little Help from another person to put on and taking off regular lower body clothing?: A Lot 6 Click Score: 16   End of Session Nurse Communication: Mobility status  Activity Tolerance: Patient limited by fatigue Patient left: in bed;with call bell/phone within reach;with bed alarm set  OT Visit Diagnosis: Unsteadiness on feet (R26.81);Repeated falls (R29.6);Muscle weakness (generalized) (M62.81)                Time: 4332-9518 OT Time Calculation (min): 18 min Charges:  OT General Charges $OT Visit: 1 Visit OT Evaluation $OT Eval Moderate Complexity: 1 Mod OT Treatments $Therapeutic Activity: 8-22 mins  Darleen Crocker, MS, OTR/L , CBIS ascom [phone]  02/02/21, 1:52 PM

## 2021-02-03 DIAGNOSIS — N1831 Chronic kidney disease, stage 3a: Secondary | ICD-10-CM | POA: Diagnosis not present

## 2021-02-03 DIAGNOSIS — G9341 Metabolic encephalopathy: Secondary | ICD-10-CM | POA: Diagnosis not present

## 2021-02-03 DIAGNOSIS — I350 Nonrheumatic aortic (valve) stenosis: Secondary | ICD-10-CM | POA: Diagnosis not present

## 2021-02-03 DIAGNOSIS — I5031 Acute diastolic (congestive) heart failure: Secondary | ICD-10-CM | POA: Diagnosis not present

## 2021-02-03 DIAGNOSIS — Z7189 Other specified counseling: Secondary | ICD-10-CM

## 2021-02-03 LAB — CBC
HCT: 29.1 % — ABNORMAL LOW (ref 36.0–46.0)
Hemoglobin: 8.7 g/dL — ABNORMAL LOW (ref 12.0–15.0)
MCH: 24.6 pg — ABNORMAL LOW (ref 26.0–34.0)
MCHC: 29.9 g/dL — ABNORMAL LOW (ref 30.0–36.0)
MCV: 82.2 fL (ref 80.0–100.0)
Platelets: 295 10*3/uL (ref 150–400)
RBC: 3.54 MIL/uL — ABNORMAL LOW (ref 3.87–5.11)
RDW: 17.9 % — ABNORMAL HIGH (ref 11.5–15.5)
WBC: 9.6 10*3/uL (ref 4.0–10.5)
nRBC: 0 % (ref 0.0–0.2)

## 2021-02-03 LAB — RENAL FUNCTION PANEL
Albumin: 3 g/dL — ABNORMAL LOW (ref 3.5–5.0)
Anion gap: 4 — ABNORMAL LOW (ref 5–15)
BUN: 42 mg/dL — ABNORMAL HIGH (ref 8–23)
CO2: 30 mmol/L (ref 22–32)
Calcium: 8.5 mg/dL — ABNORMAL LOW (ref 8.9–10.3)
Chloride: 98 mmol/L (ref 98–111)
Creatinine, Ser: 1.14 mg/dL — ABNORMAL HIGH (ref 0.44–1.00)
GFR, Estimated: 50 mL/min — ABNORMAL LOW (ref >60)
Glucose, Bld: 194 mg/dL — ABNORMAL HIGH (ref 70–99)
Phosphorus: 4.1 mg/dL (ref 2.5–4.6)
Potassium: 4.5 mmol/L (ref 3.5–5.1)
Sodium: 132 mmol/L — ABNORMAL LOW (ref 135–145)

## 2021-02-03 LAB — URINE CULTURE: Culture: >=100000 — AB

## 2021-02-03 LAB — RPR: RPR Ser Ql: NONREACTIVE

## 2021-02-03 LAB — AMMONIA: Ammonia: 30 umol/L (ref 9–35)

## 2021-02-03 LAB — CK: Total CK: 83 U/L (ref 38–234)

## 2021-02-03 LAB — HEMOGLOBIN A1C
Hgb A1c MFr Bld: 9.8 % — ABNORMAL HIGH (ref 4.8–5.6)
Mean Plasma Glucose: 235 mg/dL

## 2021-02-03 LAB — GLUCOSE, CAPILLARY
Glucose-Capillary: 190 mg/dL — ABNORMAL HIGH (ref 70–99)
Glucose-Capillary: 210 mg/dL — ABNORMAL HIGH (ref 70–99)
Glucose-Capillary: 248 mg/dL — ABNORMAL HIGH (ref 70–99)
Glucose-Capillary: 239 mg/dL — ABNORMAL HIGH (ref 70–99)

## 2021-02-03 LAB — BRAIN NATRIURETIC PEPTIDE: B Natriuretic Peptide: 833.2 pg/mL — ABNORMAL HIGH (ref 0.0–100.0)

## 2021-02-03 LAB — MAGNESIUM: Magnesium: 2 mg/dL (ref 1.7–2.4)

## 2021-02-03 MED ORDER — CEPHALEXIN 500 MG PO CAPS
500.0000 mg | ORAL_CAPSULE | Freq: Three times a day (TID) | ORAL | Status: AC
  Administered 2021-02-03 – 2021-02-08 (×15): 500 mg via ORAL
  Filled 2021-02-03 (×15): qty 1

## 2021-02-03 MED ORDER — INSULIN ASPART 100 UNIT/ML IJ SOLN: 0.0000 [IU] | Freq: Three times a day (TID) | INTRAMUSCULAR | Status: DC

## 2021-02-03 MED ORDER — INSULIN ASPART 100 UNIT/ML IJ SOLN
0.0000 [IU] | Freq: Every day | INTRAMUSCULAR | Status: DC
  Administered 2021-02-03 – 2021-02-08 (×3): 2 [IU] via SUBCUTANEOUS
  Administered 2021-02-10: 3 [IU] via SUBCUTANEOUS
  Administered 2021-02-11: 4 [IU] via SUBCUTANEOUS
  Filled 2021-02-03 (×5): qty 1

## 2021-02-03 MED ORDER — INSULIN ASPART 100 UNIT/ML IJ SOLN
0.0000 [IU] | Freq: Three times a day (TID) | INTRAMUSCULAR | Status: DC
Start: 2021-02-03 — End: 2021-02-12
  Administered 2021-02-03 – 2021-02-04 (×2): 7 [IU] via SUBCUTANEOUS
  Administered 2021-02-04: 3 [IU] via SUBCUTANEOUS
  Administered 2021-02-04: 7 [IU] via SUBCUTANEOUS
  Administered 2021-02-05: 3 [IU] via SUBCUTANEOUS
  Administered 2021-02-05: 11 [IU] via SUBCUTANEOUS
  Administered 2021-02-05: 4 [IU] via SUBCUTANEOUS
  Administered 2021-02-06: 17:00:00 7 [IU] via SUBCUTANEOUS
  Administered 2021-02-06: 09:00:00 3 [IU] via SUBCUTANEOUS
  Administered 2021-02-06: 13:00:00 7 [IU] via SUBCUTANEOUS
  Administered 2021-02-07: 4 [IU] via SUBCUTANEOUS
  Administered 2021-02-07: 11 [IU] via SUBCUTANEOUS
  Administered 2021-02-07: 4 [IU] via SUBCUTANEOUS
  Administered 2021-02-08 (×2): 7 [IU] via SUBCUTANEOUS
  Administered 2021-02-08: 4 [IU] via SUBCUTANEOUS
  Administered 2021-02-09: 15 [IU] via SUBCUTANEOUS
  Administered 2021-02-09: 4 [IU] via SUBCUTANEOUS
  Administered 2021-02-09: 11 [IU] via SUBCUTANEOUS
  Administered 2021-02-10: 20 [IU] via SUBCUTANEOUS
  Administered 2021-02-10: 11 [IU] via SUBCUTANEOUS
  Administered 2021-02-10 – 2021-02-11 (×2): 7 [IU] via SUBCUTANEOUS
  Administered 2021-02-11: 15 [IU] via SUBCUTANEOUS
  Administered 2021-02-11: 20 [IU] via SUBCUTANEOUS
  Administered 2021-02-12: 4 [IU] via SUBCUTANEOUS
  Filled 2021-02-03 (×25): qty 1

## 2021-02-03 MED ORDER — INSULIN ASPART 100 UNIT/ML IJ SOLN
0.0000 [IU] | INTRAMUSCULAR | Status: DC
Start: 2021-02-03 — End: 2021-02-03
  Filled 2021-02-03: qty 1

## 2021-02-03 MED ORDER — BETHANECHOL CHLORIDE 10 MG PO TABS
10.0000 mg | ORAL_TABLET | Freq: Three times a day (TID) | ORAL | Status: DC
  Administered 2021-02-03 – 2021-02-11 (×26): 10 mg via ORAL
  Filled 2021-02-03 (×27): qty 1

## 2021-02-03 MED ORDER — BETHANECHOL CHLORIDE 25 MG PO TABS
25.0000 mg | ORAL_TABLET | Freq: Once | ORAL | Status: AC
  Administered 2021-02-03: 25 mg via ORAL
  Filled 2021-02-03: qty 1

## 2021-02-03 MED ORDER — INSULIN ASPART 100 UNIT/ML IJ SOLN: 4.0000 [IU] | Freq: Three times a day (TID) | INTRAMUSCULAR | Status: DC

## 2021-02-03 MED ORDER — INSULIN ASPART 100 UNIT/ML IJ SOLN
4.0000 [IU] | Freq: Three times a day (TID) | INTRAMUSCULAR | Status: DC
  Administered 2021-02-03 – 2021-02-07 (×13): 4 [IU] via SUBCUTANEOUS
  Filled 2021-02-03 (×12): qty 1

## 2021-02-03 NOTE — TOC Initial Note (Signed)
Transition of Care [facility]) - Initial/Assessment Note    Patient Details  Name: Karen [name] MRN: [id number] Date of Birth: 18-Mar-1945  Transition of Care [facility]) CM/SW Contact:    Alberteen Sam, LCSW Phone Number: 02/03/2021, 10:24 AM  Clinical Narrative:                  CSW notes patient out of room at ultrasound, CSW called patient's husband Karen [name] to inform of PT recommendations of SNF. Karen [name] reports they are in agreement with this and that preference would be [facility] where patient has had rehab before.   CS[facility] has [facility] pending bed offer at this time.   Expected Discharge Plan: Skilled Nursing Facility Barriers to Discharge: Continued Medical Work up   Patient Goals and CMS Choice Patient states their goals for this hospitalization and ongoing recovery are:: to go home CMS Medicare.gov Compare Post Acute Care list provided to:: Patient Represenative (must comment) (husband) Choice offered to / list presented to : Spouse  Expected Discharge Plan and Services Expected Discharge Plan: [place] Acute Care Choice: [place] arrangements for the past 2 months: Single Family Home                                      Prior Living Arrangements/Services Living arrangements for the past 2 months: Single Family Home Lives with:: Spouse Patient language and need for interpreter reviewed:: Yes                 Activities of Daily Living Home Assistive Devices/Equipment: Environmental consultant (specify type) ADL Screening (condition at time of admission) Patient's cognitive ability adequate to safely complete daily activities?: Yes Is the patient deaf or have difficulty hearing?: No Does the patient have difficulty seeing, even when wearing glasses/contacts?: No Does the patient have difficulty concentrating, remembering, or making decisions?: No Patient able to express need for assistance with ADLs?: Yes Does the patient  have difficulty dressing or bathing?: No Independently performs ADLs?: Yes (appropriate for developmental age) Does the patient have difficulty walking or climbing stairs?: Yes Weakness of Legs: Both Weakness of Arms/Hands: Both  Permission Sought/Granted                  Emotional Assessment         Alcohol / Substance Use: Not Applicable Psych Involvement: No (comment)  Admission diagnosis:  Acute diastolic CHF (congestive heart failure) (HCC) [I50.31] Acute congestive heart failure, unspecified heart failure type ([place]) [I50.9] Patient Active Problem List   Diagnosis Date Noted   Pneumonia due to COVID-19 virus 10/29/2020   Pulmonary nodules/lesions, multiple 10/29/2020   Aortic stenosis, moderate    AF (paroxysmal atrial fibrillation) (HCC)    Gastroesophageal reflux disease without esophagitis    Acute CHF (congestive heart failure) ([place]) 06/17/2020   Chronic respiratory failure with hypercapnia ([place]) 05/21/2020   Severe sepsis ([place]) 05/21/2020   UTI (urinary tract infection) 05/21/2020   Hyperglycemia due to type 2 diabetes mellitus ([place]) 05/21/2020   Abnormal CT of liver 05/21/2020   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 05/21/2020   Morbid obesity ([place]) 05/08/2020   Acute hip pain, left 10/23/2019   Lumbar stenosis with neurogenic claudication 10/23/2019   COPD with acute exacerbation ([place]) 32/20/2542   Acute diastolic CHF (congestive heart failure) ([place]) 09/28/2019   (HFpEF) heart failure  with preserved ejection fraction ([name]) 09/27/2019   Rectal bleeding 06/28/2019   Hypokalemia 06/28/2019   Hyponatremia 06/28/2019   Type 2 diabetes mellitus with hyperlipidemia ([place]) 06/28/2019   CKD (chronic kidney disease), stage IIIa 06/28/2019   Atrial fibrillation, chronic ([place]) 06/28/2019   Pulmonary edema 02/27/2019   Bradycardia 02/24/2019   Chronic respiratory failure with hypoxia ([place]) 02/23/2019   Atypical chest pain 02/13/2019   Osteopenia of neck of left  femur 11/20/2018   AVM (arteriovenous malformation) of small bowel, acquired    Acute gastric ulcer with hemorrhage    Chronic diastolic heart failure ([place]) 08/22/2018   Diarrhea 08/22/2018   Junctional bradycardia    Acute on chronic heart failure with preserved ejection fraction (HFpEF) ([place])    AKI (acute kidney injury) ([place])    Symptomatic bradycardia 08/05/2018   Acute on chronic respiratory failure ([place]) 06/25/2018   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 diabetes mellitus ([place]) 01/25/2018   History of non anemic vitamin B12 deficiency 01/25/2018   Personal history of kidney stones 11/12/2017   Urge incontinence 11/12/2017   History of leukocytosis 09/17/2017   Right ureteral stone 09/15/2017   Acute GI bleeding    GI bleed 08/26/2017   Arthritis 08/10/2017   Stage 4 chronic kidney disease ([place]) 08/10/2017   COPD (chronic obstructive pulmonary disease) ([place]) 08/10/2017   Diabetes mellitus type 2, uncomplicated ([place]) 71/24/5809   Hypertension 08/10/2017   Obesity (BMI 35.0-39.9 without comorbidity) 04/11/2017   Primary osteoarthritis of right knee 09/01/2016   Leucocytosis 10/19/2015   Asterixis 01/07/2015   Acute on chronic respiratory failure with hypoxia and hypercapnia ([place]) 01/07/2015   Sciatica 01/07/2015   Weakness 01/07/2015   Chronic 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 01/04/2015   Iron deficiency anemia 06/22/2014   Microalbuminuria 06/22/2014   CHF (congestive heart failure) ([place]) 04/20/2014   Edema, peripheral 04/20/2014   PCP:  Earlie Counts, FNP Pharmacy:   [facility] [address] [place] [address] Phone: [phone] Fax: [phone]     Social Determinants of Health (SDOH) Interventions    Readmission Risk Interventions Readmission Risk Prevention Plan 10/30/2020 06/21/2020 09/30/2019  Transportation Screening Complete Complete Complete  PCP or Specialist Appt within  5-7 Days - - -  PCP or Specialist Appt within 3-5 Days - - Complete  Home Care Screening - - -  Medication Review (RN CM) - - -  Social Work Consult for [place] Planning/Counseling - - Complete  Palliative Care Screening - - Not Applicable  Medication Review Press photographer) Complete Complete Complete  PCP or Specialist appointment within 3-5 days of discharge Complete Complete -  PCP/Specialist Appt Not Complete comments - - -  HRI or Home Care Consult Patient refused Complete -  SW Recovery Care/Counseling Consult Complete Complete -  Palliative Care Screening Not Applicable Not Applicable -  [place] Not Applicable Not Applicable -  Some recent data might be hidden

## 2021-02-03 NOTE — NC FL2 (Signed)
[place] LEVEL OF CARE SCREENING TOOL     IDENTIFICATION  Patient Name: Karen [name] Birthdate: October 28, 1945 Sex: female Admission Date (Current Location): 02/01/2021  [facility] and [place] Number:  Engineering geologist and Address:  [facility], [address], [place], [place] [address]      Provider Number: [id number]  Attending Physician Name and Address:  Mercy Riding, MD  Relative Name and Phone Number:  Jeanell Sparrow (spouse) [phone]    Current Level of Care: Hospital Recommended Level of Care: [place] Prior Approval Number:    Date Approved/Denied:   PASRR Number: [id number]  Discharge Plan: SNF    Current Diagnoses: Patient Active Problem List   Diagnosis Date Noted   Pneumonia due to COVID-19 virus 10/29/2020   Pulmonary nodules/lesions, multiple 10/29/2020   Aortic stenosis, moderate    AF (paroxysmal atrial fibrillation) (HCC)    Gastroesophageal reflux disease without esophagitis    Acute CHF (congestive heart failure) ([place]) 06/17/2020   Chronic respiratory failure with hypercapnia ([place]) 05/21/2020   Severe sepsis ([place]) 05/21/2020   UTI (urinary tract infection) 05/21/2020   Hyperglycemia due to type 2 diabetes mellitus ([place]) 05/21/2020   Abnormal CT of liver 05/21/2020   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 05/21/2020   Morbid obesity ([place]) 05/08/2020   Acute hip pain, left 10/23/2019   Lumbar stenosis with neurogenic claudication 10/23/2019   COPD with acute exacerbation ([place]) 40/97/3532   Acute diastolic CHF (congestive heart failure) ([place]) 09/28/2019   (HFpEF) heart failure with preserved ejection fraction ([place]) 09/27/2019   Rectal bleeding 06/28/2019   Hypokalemia 06/28/2019   Hyponatremia 06/28/2019   Type 2 diabetes mellitus with hyperlipidemia ([place]) 06/28/2019   CKD (chronic kidney disease), stage IIIa 06/28/2019   Atrial fibrillation, chronic ([place]) 06/28/2019   Pulmonary edema  02/27/2019   Bradycardia 02/24/2019   Chronic respiratory failure with hypoxia ([place]) 02/23/2019   Atypical chest pain 02/13/2019   Osteopenia of neck of left femur 11/20/2018   AVM (arteriovenous malformation) of small bowel, acquired    Acute gastric ulcer with hemorrhage    Chronic diastolic heart failure ([place]) 08/22/2018   Diarrhea 08/22/2018   Junctional bradycardia    Acute on chronic heart failure with preserved ejection fraction (HFpEF) ([place])    AKI (acute kidney injury) ([place])    Symptomatic bradycardia 08/05/2018   Acute on chronic respiratory failure ([place]) 06/25/2018   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 diabetes mellitus ([place]) 01/25/2018   History of non anemic vitamin B12 deficiency 01/25/2018   Personal history of kidney stones 11/12/2017   Urge incontinence 11/12/2017   History of leukocytosis 09/17/2017   Right ureteral stone 09/15/2017   Acute GI bleeding    GI bleed 08/26/2017   Arthritis 08/10/2017   Stage 4 chronic kidney disease ([place]) 08/10/2017   COPD (chronic obstructive pulmonary disease) ([place]) 08/10/2017   Diabetes mellitus type 2, uncomplicated ([place]) 99/24/2683   Hypertension 08/10/2017   Obesity (BMI 35.0-39.9 without comorbidity) 04/11/2017   Primary osteoarthritis of right knee 09/01/2016   Leucocytosis 10/19/2015   Asterixis 01/07/2015   Acute on chronic respiratory failure with hypoxia and hypercapnia ([place]) 01/07/2015   Sciatica 01/07/2015   Weakness 01/07/2015   Chronic 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 01/04/2015   Iron deficiency anemia 06/22/2014   Microalbuminuria 06/22/2014   CHF (congestive heart failure) ([place]) 04/20/2014   Edema, peripheral 04/20/2014    Orientation RESPIRATION BLADDER Height & Weight  Self, Time, Situation, Place  O2 (3L nasal cannula) Incontinent, Indwelling catheter Weight: 231 lb 11.3 oz (105.1 kg) Height:  5\' 3"  (160 cm)  BEHAVIORAL SYMPTOMS/MOOD NEUROLOGICAL BOWEL NUTRITION  STATUS      Continent Diet (see discharge summary)  AMBULATORY STATUS COMMUNICATION OF NEEDS Skin   Limited Assist Verbally Normal                       Personal Care Assistance Level of Assistance  Bathing, Feeding, Dressing, Total care Bathing Assistance: Limited assistance Feeding assistance: Independent Dressing Assistance: Limited assistance Total Care Assistance: Limited assistance   Functional Limitations Info  Sight, Hearing, Speech Sight Info: Adequate Hearing Info: Adequate Speech Info: Adequate    SPECIAL CARE FACTORS FREQUENCY  PT (By licensed PT), OT (By licensed OT)     PT Frequency: min 3x weekly OT Frequency: min 3x weekly            Contractures Contractures Info: Not present    Additional Factors Info  Code Status, Allergies Code Status Info: full Allergies Info: ace inhibitors, beta adrenergic blockers, gabapentin, lisinopril, lyrica (pregabalin), shrimp (shellfish allergy)           Current Medications (02/03/2021):  This is the current hospital active medication list Current Facility-Administered Medications  Medication Dose Route Frequency Provider Last Rate Last Admin   acetaminophen (TYLENOL) tablet 500-1,000 mg  500-1,000 mg Oral Q6H PRN Agbata, Tochukwu, MD       allopurinol (ZYLOPRIM) tablet 100 mg  100 mg Oral Daily Agbata, Tochukwu, MD   100 mg at 02/03/21 0836   apixaban (ELIQUIS) tablet 5 mg  5 mg Oral BID Agbata, Tochukwu, MD   5 mg at 02/03/21 0836   atorvastatin (LIPITOR) tablet 10 mg  10 mg Oral Daily Agbata, Tochukwu, MD   10 mg at 02/03/21 0836   bethanechol (URECHOLINE) tablet 25 mg  25 mg Oral Once Wendee Beavers T, MD       Followed by   bethanechol (URECHOLINE) tablet 10 mg  10 mg Oral TID Wendee Beavers T, MD       cephALEXin (KEFLEX) capsule 500 mg  500 mg Oral Q8H Gonfa, Taye T, MD       Chlorhexidine Gluconate Cloth 2 % PADS 6 each  6 each Topical Daily Agbata, Tochukwu, MD   6 each at 02/02/21 1513   ferrous sulfate  tablet 325 mg  325 mg Oral Daily Agbata, Tochukwu, MD   325 mg at 02/03/21 0835   fluticasone (FLONASE) 50 MCG/ACT nasal spray 2 spray  2 spray Each Nare Daily Agbata, Tochukwu, MD   2 spray at 02/03/21 0838   fluticasone furoate-vilanterol (BREO ELLIPTA) 100-25 MCG/ACT 1 puff  1 puff Inhalation Daily Agbata, Tochukwu, MD   1 puff at 02/03/21 0838   furosemide (LASIX) injection 40 mg  40 mg Intravenous Daily Gonfa, Taye T, MD   40 mg at 02/03/21 0836   insulin aspart (novoLOG) injection 0-20 Units  0-20 Units Subcutaneous TID WC Agbata, Tochukwu, MD   4 Units at 02/03/21 0836   insulin glargine-yfgn (SEMGLEE) injection 30 Units  30 Units Subcutaneous Daily Agbata, Tochukwu, MD   30 Units at 02/03/21 0836   ipratropium-albuterol (DUONEB) 0.5-2.5 (3) MG/3ML nebulizer solution 3 mL  3 mL Nebulization QID PRN Agbata, Tochukwu, MD       montelukast (SINGULAIR) tablet 10 mg  10 mg Oral QHS Agbata, Tochukwu, MD   10 mg at 02/02/21 2238  ondansetron (ZOFRAN) tablet 4 mg  4 mg Oral Q6H PRN Agbata, Tochukwu, MD       Or   ondansetron (ZOFRAN) injection 4 mg  4 mg Intravenous Q6H PRN Agbata, Tochukwu, MD       pantoprazole (PROTONIX) EC tablet 40 mg  40 mg Oral Daily Agbata, Tochukwu, MD   40 mg at 02/03/21 0836   umeclidinium bromide (INCRUSE ELLIPTA) 62.5 MCG/ACT 1 puff  1 puff Inhalation Daily Agbata, Tochukwu, MD   1 puff at 02/03/21 8719   vitamin B-12 (CYANOCOBALAMIN) tablet 500 mcg  500 mcg Oral Daily Agbata, Tochukwu, MD   500 mcg at 02/03/21 5974     Discharge Medications: Please see discharge summary for a list of discharge medications.  Relevant Imaging Results:  Relevant Lab Results:   Additional Information SSN: [id number]  Alberteen Sam, LCSW

## 2021-02-03 NOTE — Progress Notes (Addendum)
PROGRESS NOTE  Karen [name] GBT:[id number] DOB: 01/08/1946   PCP: Earlie Counts, FNP  Patient is from: Home.  Lives with husband.  Uses walker at baseline.  DOA: 02/01/2021 LOS: 2  Chief complaints:  Chief Complaint  Patient presents with   Shortness of Breath     Brief Narrative / Interim history: 75 year old F with PMH of COPD, chronic hypoxic RF on 3 L, diastolic CHF, moderate aortic stenosis, A. fib, DM-2 and morbid obesity presenting with decreased urine output, SOB, DOE, orthopnea, edema, chest pressure, abdominal pain, distention and diarrhea, and admitted with working diagnosis of acute diastolic CHF.  BNP elevated to 935.  CXR with cardiomegaly.  Foley catheter placed in ED.  Patient received IV Lasix in ER.  CT chest ordered on admission, and showed improved pleural effusion, cardiomegaly and probable liver cirrhosis.   Patient also had encephalopathy the next day.  UA concerning for UTI.  Urine culture with E. coli and Klebsiella pneumonia.  Antibiotics de-escalated to p.o. Keflex.  Subjective: Seen and examined earlier this morning.  No major events overnight of this morning.  Reports improvement in her abdominal pain.  She describes the pain as pressure.  Patient seems to be diffuse.  She states her breathing is still shallow.  She denies chest pain, nausea, vomiting, dysuria, frequency or urgency.  She says she was not able to void when she presented to ED.  Objective: Vitals:   02/03/21 0011 02/03/21 0338 02/03/21 0758 02/03/21 1226  BP: (!) 112/50 (!) 113/54 (!) 103/53 (!) 149/66  Pulse: (!) 56 60 (!) 55 97  Resp: (!) 21 20 20 20   Temp: 97.9 F (36.6 C) 98 F (36.7 C) 98.7 F (37.1 C) 98.4 F (36.9 C)  TempSrc:  Oral  Axillary  SpO2: 95% 95% 96% 90%  Weight:  105.1 kg    Height:        Intake/Output Summary (Last 24 hours) at 02/03/2021 1615 Last data filed at 02/03/2021 1330 Gross per 24 hour  Intake 1450 ml  Output 2000 ml  Net -550 ml   Filed  Weights   02/01/21 1942 02/02/21 0559 02/03/21 0338  Weight: 106.7 kg 106 kg 105.1 kg    Examination:  GENERAL: No apparent distress.  Nontoxic. HEENT: MMM.  Vision and hearing grossly intact.  NECK: Supple.  Difficult to assess JVD due to body habitus. RESP: 90% on 3 L.  No IWOB.  Fair aeration bilaterally. CVS:  RRR. Heart sounds normal.  ABD/GI/GU: BS+. Abd soft.  Mild diffuse tenderness but no rebound or guarding.  Indwelling Foley catheter. MSK/EXT:  Moves extremities. No apparent deformity.  1+ BLE edema. SKIN: no apparent skin lesion or wound NEURO: Awake and alert. Oriented appropriately.  No apparent focal neuro deficit. PSYCH: Calm. Normal affect.   Procedures:  None  Microbiology summarized: [id number] and influenza PCR nonreactive. Urine culture with GNR.  Assessment & Plan: Respiratory distress/acute on chronic diastolic CHF-patient presents with SOB, DOE, orthopnea, edema, chest pressure, abdominal pain, distention and diarrhea.  BNP 935 (56 on 06/19/2020).  CXR with cardiomegaly.  TTE in 06/2020 with LVEF of 60 to 65%, mild to moderate AVS. started on IV Lasix.  Net -2.5 L.  Creatinine and sodium improving.  Still with signs of fluid overload although difficult to assess due to body habitus. -Continue IV Lasix 40 mg daily -Monitor fluid status, renal functions and electrolytes. -Sodium and fluid restrictions -Discontinue Foley catheter.  Acute metabolic encephalopathy: Unclear etiology of this.  VBG, TSH, ammonia, B12, RPR unrevealing.  Concern about UTI.  Encephalopathy resolved. -Continue monitoring. -Reorientation and delirium precautions.  Possible UTI: Urine culture with E. coli and Klebsiella pneumonia.  -Ceftriaxone 11/30>> p.o. Keflex 12/1>> for 5 more days.  Chronic COPD/chronic hypoxic respiratory failure-on 3 L at baseline.  Does not seem to be in exacerbation. -Minimum oxygen to keep saturation above 88% regardless of home 3 L. -Continue inhalers and  nebulizers  AKI/azotemia CKD-3A: Seems to be improving with diuretics.  CK within normal. Recent Labs    10/30/20 0416 10/31/20 2585 11/01/20 0521 11/02/20 0426 11/03/20 0421 11/04/20 0317 11/05/20 0351 02/01/21 1256 02/02/21 0616 02/03/21 0414  BUN 35* 37* 39* 36* 37* 39* 48* 48* 44* 42*  CREATININE 1.19* 0.94 0.92 1.00 1.02* 1.12* 1.10* 1.28* 1.21* 1.14*  -Continue monitoring -Continue holding losartan and HCTZ.  Normocytic anemia: No report of GI bleed Recent Labs    10/30/20 0416 10/31/20 2778 11/01/20 0521 11/02/20 0426 11/03/20 0421 11/04/20 0317 11/05/20 0351 02/01/21 1627 02/02/21 0616 02/03/21 0414  HGB 9.0* 9.4* 8.8* 9.6* 9.4* 10.0* 9.6* 9.1* 8.6* 8.7*  -Monitor H&H -Check anemia panel in the morning  Uncontrolled IDDM-2 with hyperglycemia and other complications. Recent Labs  Lab 02/02/21 0759 02/02/21 1123 02/02/21 1648 02/03/21 0756 02/03/21 1108  GLUCAP 150* 196* 227* 190* 210*  -Continue basal insulin 30 units daily -Continue SSI-resistant scale -Added NovoLog 4 units 3 times daily with meals -Continue statin. -Follow hemoglobin A1c  Paroxysmal A. Fib: Currently in sinus rhythm.  Not on rate or rhythm control medication likely due to history of bradycardia.. -Continue home Eliquis.   Essential hypertension: Soft BP for most part -Continue holding losartan and HCTZ. -Diuretics as above  Chronic back chronic pain: -Holding home medications given encephalopathy.  Hyponatremia: Improved.  Likely hypervolemic.  Could be due to losartan and HCTZ as well. -Continue IV Lasix -Holding losartan and HCTZ as above  Addendum Goal of care discussion: Discussed goal of care including status and pros and cons of CPR and intubation with patient earlier this morning.  Patient prefers to be DNR/DNI which is reasonable and appropriate. -Changed CODE STATUS to DNR/DNI.  Morbid obesity Body mass index is 41.04 kg/m.  -PT/OT eval       DVT  prophylaxis:   apixaban (ELIQUIS) tablet 5 mg  Code Status: DNR/DNI. Family Communication: Patient and/or RN. Available if any question.  Level of care: Telemetry Cardiac Status is: Inpatient  Remains inpatient appropriate because: Remains inpatient given encephalopathy, CHF exacerbation requiring IV diuretics and possible UTI requiring IV antibiotics       Consultants:  None   Sch Meds:  Scheduled Meds:  allopurinol  100 mg Oral Daily   apixaban  5 mg Oral BID   atorvastatin  10 mg Oral Daily   bethanechol  10 mg Oral TID   cephALEXin  500 mg Oral Q8H   ferrous sulfate  325 mg Oral Daily   fluticasone  2 spray Each Nare Daily   fluticasone furoate-vilanterol  1 puff Inhalation Daily   furosemide  40 mg Intravenous Daily   [START ON 02/04/2021] insulin aspart  0-20 Units Subcutaneous TID WC   insulin aspart  0-5 Units Subcutaneous QHS   [START ON 02/04/2021] insulin aspart  4 Units Subcutaneous TID WC   insulin glargine-yfgn  30 Units Subcutaneous Daily   montelukast  10 mg Oral QHS   pantoprazole  40 mg Oral Daily   umeclidinium bromide  1 puff Inhalation Daily  vitamin B-12  500 mcg Oral Daily   Continuous Infusions: PRN Meds:.acetaminophen, ipratropium-albuterol, ondansetron **OR** ondansetron (ZOFRAN) IV  Antimicrobials: Anti-infectives (From admission, onward)    Start     Dose/Rate Route Frequency Ordered Stop   02/03/21 1400  cephALEXin (KEFLEX) capsule 500 mg        500 mg Oral Every 8 hours 02/03/21 1027 02/08/21 1359   02/02/21 1400  cefTRIAXone (ROCEPHIN) 2 g in sodium chloride 0.9 % 100 mL IVPB  Status:  Discontinued        2 g 200 mL/hr over 30 Minutes Intravenous Every 24 hours 02/02/21 1311 02/03/21 1027        I have personally reviewed the following labs and images: CBC: Recent Labs  Lab 02/01/21 1627 02/02/21 0616 02/03/21 0414  WBC 11.3* 11.2* 9.6  HGB 9.1* 8.6* 8.7*  HCT 29.6* 28.0* 29.1*  MCV 82.0 80.7 82.2  PLT 306 284 295    BMP &GFR Recent Labs  Lab 02/01/21 1256 02/02/21 0616 02/03/21 0414  NA 128* 130* 132*  K 5.2* 4.8 4.5  CL 97* 99 98  CO2 24 27 30   GLUCOSE 172* 145* 194*  BUN 48* 44* 42*  CREATININE 1.28* 1.21* 1.14*  CALCIUM 8.9 8.6* 8.5*  MG  --   --  2.0  PHOS  --   --  4.1   Estimated Creatinine Clearance: 48.7 mL/min (A) (by C-G formula based on SCr of 1.14 mg/dL (H)). Liver & Pancreas: Recent Labs  Lab 02/03/21 0414  ALBUMIN 3.0*   No results for input(s): LIPASE, AMYLASE in the last 168 hours. Recent Labs  Lab 02/02/21 1154 02/03/21 0414  AMMONIA 36* 30   Diabetic: Recent Labs    02/01/21 1627  HGBA1C 10.1*   Recent Labs  Lab 02/02/21 0759 02/02/21 1123 02/02/21 1648 02/03/21 0756 02/03/21 1108  GLUCAP 150* 196* 227* 190* 210*   Cardiac Enzymes: Recent Labs  Lab 02/03/21 0414  CKTOTAL 83   No results for input(s): PROBNP in the last 8760 hours. Coagulation Profile: No results for input(s): INR, PROTIME in the last 168 hours. Thyroid Function Tests: Recent Labs    02/02/21 1154  TSH 3.489   Lipid Profile: No results for input(s): CHOL, HDL, LDLCALC, TRIG, CHOLHDL, LDLDIRECT in the last 72 hours. Anemia Panel: Recent Labs    02/02/21 1154  VITAMINB12 479   Urine analysis:    Component Value Date/Time   COLORURINE YELLOW 02/02/2021 1200   APPEARANCEUR HAZY (A) 02/02/2021 1200   APPEARANCEUR Cloudy (A) 09/28/2017 1338   LABSPEC 1.015 02/02/2021 1200   LABSPEC 1.024 04/02/2014 0517   PHURINE 5.5 02/02/2021 1200   GLUCOSEU NEGATIVE 02/02/2021 1200   GLUCOSEU >=500 04/02/2014 0517   HGBUR TRACE (A) 02/02/2021 1200   BILIRUBINUR NEGATIVE 02/02/2021 1200   BILIRUBINUR Negative 09/28/2017 1338   BILIRUBINUR Negative 04/02/2014 [place] 02/02/2021 1200   PROTEINUR NEGATIVE 02/02/2021 1200   NITRITE NEGATIVE 02/02/2021 1200   LEUKOCYTESUR SMALL (A) 02/02/2021 1200   LEUKOCYTESUR Trace 04/02/2014 0517   Sepsis Labs: Invalid  input(s): PROCALCITONIN, [place]  Microbiology: Recent Results (from the past 240 hour(s))  Resp Panel by RT-PCR (Flu A&B, Covid) Nasopharyngeal Swab     Status: None   Collection Time: 02/01/21 12:56 PM   Specimen: Nasopharyngeal Swab; Nasopharyngeal(NP) swabs in vial transport medium  Result Value Ref Range Status   SARS Coronavirus 2 by RT PCR NEGATIVE NEGATIVE Final    Comment: (NOTE) SARS-CoV-2 target nucleic acids  are NOT DETECTED.  The SARS-CoV-2 RNA is generally detectable in upper respiratory specimens during the acute phase of infection. The lowest concentration of SARS-CoV-2 viral copies this assay can detect is 138 copies/mL. A negative result does not preclude SARS-Cov-2 infection and should not be used as the sole basis for treatment or other patient management decisions. A negative result may occur with  improper specimen collection/handling, submission of specimen other than nasopharyngeal swab, presence of viral mutation(s) within the areas targeted by this assay, and inadequate number of viral copies(<138 copies/mL). A negative result must be combined with clinical observations, patient history, and epidemiological information. The expected result is Negative.  Fact Sheet for Patients:  EntrepreneurPulse.com.au  Fact Sheet for Healthcare Providers:  IncredibleEmployment.be  This test is no t yet approved or cleared by the [place] FDA and  has been authorized for detection and/or diagnosis of SARS-CoV-2 by FDA under an Emergency Use Authorization (EUA). This EUA will remain  in effect (meaning this test can be used) for the duration of the COVID-19 declaration under Section 564(b)(1) of the Act, 21 U.S.C.section 360bbb-3(b)(1), unless the authorization is terminated  or revoked sooner.       Influenza A by PCR NEGATIVE NEGATIVE Final   Influenza B by PCR NEGATIVE NEGATIVE Final    Comment: (NOTE) The Xpert  Xpress SARS-CoV-2/FLU/RSV plus assay is intended as an aid in the diagnosis of influenza from Nasopharyngeal swab specimens and should not be used as a sole basis for treatment. Nasal washings and aspirates are unacceptable for Xpert Xpress SARS-CoV-2/FLU/RSV testing.  Fact Sheet for Patients: EntrepreneurPulse.com.au  Fact Sheet for Healthcare Providers: IncredibleEmployment.be  This test is not yet approved or cleared by the [place] FDA and has been authorized for detection and/or diagnosis of SARS-CoV-2 by FDA under an Emergency Use Authorization (EUA). This EUA will remain in effect (meaning this test can be used) for the duration of the COVID-19 declaration under Section 564(b)(1) of the Act, 21 U.S.C. section 360bbb-3(b)(1), unless the authorization is terminated or revoked.  Performed at [facility], [address] [place], [place] [address]   Urine Culture     Status: Abnormal   Collection Time: 02/01/21  1:42 PM   Specimen: Urine, Catheterized  Result Value Ref Range Status   Specimen Description   Final    URINE, CATHETERIZED Performed at [facility], [address] [place], [place] [address]    Special Requests   Final    NONE Performed at [facility], [place], [place] [address]    Culture (A)  Final    >=100,000 COLONIES/mL ESCHERICHIA COLI 80,000 COLONIES/mL KLEBSIELLA PNEUMONIAE    Report Status 02/03/2021 FINAL  Final   Organism ID, Bacteria ESCHERICHIA COLI (A)  Final   Organism ID, Bacteria KLEBSIELLA PNEUMONIAE (A)  Final      Susceptibility   Escherichia coli - MIC*    AMPICILLIN 8 SENSITIVE Sensitive     CEFAZOLIN <=4 SENSITIVE Sensitive     CEFEPIME <=0.12 SENSITIVE Sensitive     CEFTRIAXONE <=0.25 SENSITIVE Sensitive     CIPROFLOXACIN <=0.25 SENSITIVE Sensitive     GENTAMICIN <=1 SENSITIVE Sensitive     IMIPENEM <=0.25 SENSITIVE Sensitive      NITROFURANTOIN <=16 SENSITIVE Sensitive     TRIMETH/SULFA <=20 SENSITIVE Sensitive     AMPICILLIN/SULBACTAM 4 SENSITIVE Sensitive     PIP/TAZO <=4 SENSITIVE Sensitive     * >=100,000 COLONIES/mL ESCHERICHIA COLI   Klebsiella pneumoniae -  MIC*    AMPICILLIN >=32 RESISTANT Resistant     CEFAZOLIN <=4 SENSITIVE Sensitive     CEFEPIME <=0.12 SENSITIVE Sensitive     CEFTRIAXONE <=0.25 SENSITIVE Sensitive     CIPROFLOXACIN <=0.25 SENSITIVE Sensitive     GENTAMICIN <=1 SENSITIVE Sensitive     IMIPENEM <=0.25 SENSITIVE Sensitive     NITROFURANTOIN 64 INTERMEDIATE Intermediate     TRIMETH/SULFA <=20 SENSITIVE Sensitive     AMPICILLIN/SULBACTAM 4 SENSITIVE Sensitive     PIP/TAZO <=4 SENSITIVE Sensitive     * 80,000 COLONIES/mL KLEBSIELLA PNEUMONIAE    Radiology Studies: No results found.   Hale Chalfin T. Castalia  If 7PM-7AM, please contact night-coverage [email or web address] 02/03/2021, 4:15 PM

## 2021-02-03 NOTE — Progress Notes (Signed)
Inpatient Diabetes Program Recommendations  AACE/ADA: New Consensus Statement on Inpatient Glycemic Control   Target Ranges:  Prepandial:   less than 140 mg/dL      Peak postprandial:   less than 180 mg/dL (1-2 hours)      Critically ill patients:  140 - 180 mg/dL    Latest Reference Range & Units 02/01/21 20:05 02/02/21 07:59 02/02/21 11:23 02/02/21 16:48 02/03/21 07:56  Glucose-Capillary 70 - 99 mg/dL 128 (H) 150 (H) 196 (H) 227 (H) 190 (H)   Review of Glycemic Control  Diabetes history: DM2 Outpatient Diabetes medications: Lantus 38 units daily, Humalog 10 units TID with meals, Metformin 1000 mg BID Current orders for Inpatient glycemic control: Semglee 30 units daily, Novolog 0-20 units TID with meals  Inpatient Diabetes Program Recommendations:    Insulin: Please consider increasing Semglee to 33 units daily and ordering Novolog 5 units TID with meals for meal coverage if patient eats at least 50% of meals.  NOTE: Per chart, patient is followed by [facility] and last seen Renae Gloss, NP on 09/14/20. Per office note on 09/14/20 patient was prescribed Metformin 500 mg BID, Ozempic 2 mg Qweek, Lantus 44 units QPM, Humalog 10 units TID with meals for DM control. Noted telephone note on 01/20/21 which notes "Patient called and states PCP Ples Specter took her off metformin and stated she did not need medication because BLOOD SUGAR was fine. Per patient she was doing good when she was taking her medication, now her BLOOD SUGAR (meter) is reading High from time to time. Other than that it's reading 300 or greater. Patient been off medication for about a month, and feels she need to go back on metformin."  Patient was advised to restart Metformin 500 mg BID.  Thanks, Barnie Alderman, RN, MSN, CDE Diabetes Coordinator Inpatient Diabetes Program [phone] (Team Pager from 8am to 5pm)

## 2021-02-04 DIAGNOSIS — I350 Nonrheumatic aortic (valve) stenosis: Secondary | ICD-10-CM | POA: Diagnosis not present

## 2021-02-04 DIAGNOSIS — I5031 Acute diastolic (congestive) heart failure: Secondary | ICD-10-CM | POA: Diagnosis not present

## 2021-02-04 DIAGNOSIS — Z7189 Other specified counseling: Secondary | ICD-10-CM | POA: Diagnosis not present

## 2021-02-04 DIAGNOSIS — D509 Iron deficiency anemia, unspecified: Secondary | ICD-10-CM

## 2021-02-04 DIAGNOSIS — G9341 Metabolic encephalopathy: Secondary | ICD-10-CM | POA: Diagnosis not present

## 2021-02-04 LAB — RENAL FUNCTION PANEL
Albumin: 3.1 g/dL — ABNORMAL LOW (ref 3.5–5.0)
Anion gap: 4 — ABNORMAL LOW (ref 5–15)
BUN: 26 mg/dL — ABNORMAL HIGH (ref 8–23)
CO2: 32 mmol/L (ref 22–32)
Calcium: 8.6 mg/dL — ABNORMAL LOW (ref 8.9–10.3)
Chloride: 99 mmol/L (ref 98–111)
Creatinine, Ser: 0.82 mg/dL (ref 0.44–1.00)
GFR, Estimated: >60 mL/min (ref >60)
Glucose, Bld: 140 mg/dL — ABNORMAL HIGH (ref 70–99)
Phosphorus: 3.2 mg/dL (ref 2.5–4.6)
Potassium: 4.1 mmol/L (ref 3.5–5.1)
Sodium: 135 mmol/L (ref 135–145)

## 2021-02-04 LAB — FOLATE: Folate: 12.7 ng/mL (ref >5.9)

## 2021-02-04 LAB — RESP PANEL BY RT-PCR (FLU A&B, COVID) ARPGX2
Influenza A by PCR: NEGATIVE
Influenza B by PCR: NEGATIVE
SARS Coronavirus 2 by RT PCR: NEGATIVE

## 2021-02-04 LAB — CBC
HCT: 29.5 % — ABNORMAL LOW (ref 36.0–46.0)
Hemoglobin: 8.8 g/dL — ABNORMAL LOW (ref 12.0–15.0)
MCH: 24.6 pg — ABNORMAL LOW (ref 26.0–34.0)
MCHC: 29.8 g/dL — ABNORMAL LOW (ref 30.0–36.0)
MCV: 82.6 fL (ref 80.0–100.0)
Platelets: 285 10*3/uL (ref 150–400)
RBC: 3.57 MIL/uL — ABNORMAL LOW (ref 3.87–5.11)
RDW: 17.6 % — ABNORMAL HIGH (ref 11.5–15.5)
WBC: 9.7 10*3/uL (ref 4.0–10.5)
nRBC: 0 % (ref 0.0–0.2)

## 2021-02-04 LAB — RETICULOCYTES
Immature Retic Fract: 29 % — ABNORMAL HIGH (ref 2.3–15.9)
RBC.: 3.48 MIL/uL — ABNORMAL LOW (ref 3.87–5.11)
Retic Count, Absolute: 57.4 10*3/uL (ref 19.0–186.0)
Retic Ct Pct: 1.7 % (ref 0.4–3.1)

## 2021-02-04 LAB — GLUCOSE, CAPILLARY
Glucose-Capillary: 149 mg/dL — ABNORMAL HIGH (ref 70–99)
Glucose-Capillary: 204 mg/dL — ABNORMAL HIGH (ref 70–99)
Glucose-Capillary: 207 mg/dL — ABNORMAL HIGH (ref 70–99)
Glucose-Capillary: 115 mg/dL — ABNORMAL HIGH (ref 70–99)

## 2021-02-04 LAB — VITAMIN B12: Vitamin B-12: 613 pg/mL (ref 180–914)

## 2021-02-04 LAB — MAGNESIUM: Magnesium: 1.7 mg/dL (ref 1.7–2.4)

## 2021-02-04 LAB — IRON AND TIBC
Iron: 22 ug/dL — ABNORMAL LOW (ref 28–170)
Saturation Ratios: 5 % — ABNORMAL LOW (ref 10.4–31.8)
TIBC: 456 ug/dL — ABNORMAL HIGH (ref 250–450)
UIBC: 434 ug/dL

## 2021-02-04 LAB — BRAIN NATRIURETIC PEPTIDE: B Natriuretic Peptide: 484.9 pg/mL — ABNORMAL HIGH (ref 0.0–100.0)

## 2021-02-04 LAB — FERRITIN: Ferritin: 9 ng/mL — ABNORMAL LOW (ref 11–307)

## 2021-02-04 LAB — VITAMIN B1: Vitamin B1 (Thiamine): 84.5 nmol/L (ref 66.5–200.0)

## 2021-02-04 MED ORDER — GUAIFENESIN-DM 100-10 MG/5ML PO SYRP
5.0000 mL | ORAL_SOLUTION | ORAL | Status: DC | PRN
  Administered 2021-02-04 – 2021-02-07 (×5): 5 mL via ORAL
  Filled 2021-02-04 (×6): qty 5

## 2021-02-04 MED ORDER — FERUMOXYTOL INJECTION 510 MG/17 ML
510.0000 mg | Freq: Once | INTRAVENOUS | Status: AC
  Administered 2021-02-04: 510 mg via INTRAVENOUS
  Filled 2021-02-04: qty 17

## 2021-02-04 MED ORDER — SODIUM CHLORIDE 0.9 % IV SOLN
250.0000 mg | Freq: Every day | INTRAVENOUS | Status: DC
  Administered 2021-02-04: 250 mg via INTRAVENOUS
  Filled 2021-02-04 (×2): qty 20

## 2021-02-04 MED ORDER — MAGNESIUM SULFATE 2 GM/50ML IV SOLN
2.0000 g | Freq: Once | INTRAVENOUS | Status: AC
  Administered 2021-02-04: 2 g via INTRAVENOUS
  Filled 2021-02-04: qty 50

## 2021-02-04 MED ORDER — ALUM & MAG HYDROXIDE-SIMETH 200-200-20 MG/5ML PO SUSP
30.0000 mL | ORAL | Status: DC | PRN
  Administered 2021-02-04: 30 mL via ORAL
  Filled 2021-02-04: qty 30

## 2021-02-04 NOTE — Consult Note (Signed)
   Heart Failure Nurse Navigator Note  HFpEF 60 to 65%.  Mild left ventricular hypertrophy.  Normal right ventricular systolic function.  Mild to moderate aortic stenosis.  Presented to the emergency room with complaints of difficulty voiding for 3 days and worsening shortness of breath.  Along with lower extremity edema.  BNP on admission was 935.  Comorbidities:  Iron deficiency anemia Asthma Chronic kidney disease stage III Diabetes GI bleed Hyperlipidemia Hypertension Moderate aortic stenosis Morbid obesity Osteoarthritis  Medications:  Apixaban 5 mg 2 times a day Atorvastatin 10 mg daily Furosemide 40 mg IV daily   Labs:  BNP 484, magnesium 1.7, sodium 135, potassium 4.1, chloride 99, CO2 32, BUN 26, albumin 3.1, creatinine 0.82. Weight is 105.1 Blood pressure 133/68   Initial meeting with patient and her husband who was at the bedside.  Patient states that she is still not feeling back to normal.  Discussed that Nile Dear with para medicine calls her at home and follows with her at least monthly.  Discussed low-sodium diet with patient and husband, husband is the one who does the meal preparation.  He admits to fixing bacon and sausage for breakfast.  He does not read labels for sodium content.  They do not eat canned vegetables or soups.  Needed to be redirected several times as he wanted to discussed diabetic issues.  States that they do not eat in restaurants as this is too expensive.  Patient states that she weighs herself on most days but not every day.  Stressed the importance of weight daily and what to report to physician.

## 2021-02-04 NOTE — Care Management Important Message (Signed)
Important Message  Patient Details  Name: Karen [name] MRN: [id number] Date of Birth: 02/14/1946   Medicare Important Message Given:  Yes     [name] 02/04/2021, 3:03 PM

## 2021-02-04 NOTE — Progress Notes (Signed)
PROGRESS NOTE  Karen [name] PJK:[id number] DOB: 11/01/1945   PCP: Earlie Counts, FNP  Patient is from: Home.  Lives with husband.  Uses walker at baseline.  DOA: 02/01/2021 LOS: 3  Chief complaints:  Chief Complaint  Patient presents with   Shortness of Breath     Brief Narrative / Interim history: 75 year old F with PMH of COPD, chronic hypoxic RF on 3 L, diastolic CHF, moderate aortic stenosis, A. fib, DM-2 and morbid obesity presenting with decreased urine output, SOB, DOE, orthopnea, edema, chest pressure, abdominal pain, distention and diarrhea, and admitted with working diagnosis of acute diastolic CHF.  BNP elevated to 935.  CXR with cardiomegaly.  Foley catheter placed in ED.  Patient received IV Lasix in ER.  CT chest ordered on admission, and showed improved pleural effusion, cardiomegaly and probable liver cirrhosis.   Patient also had encephalopathy the next day.  UA concerning for UTI.  Urine culture with E. coli and Klebsiella pneumonia.  Antibiotics de-escalated to p.o. Keflex.  Remains on IV Lasix.  Subjective: Seen and examined earlier this morning.  No major events overnight of this morning.  Patient's husband at bedside.  Still with some abdominal pain but reports improvement.  Breathing has improved.  Excellent urine output.  She had about 3 L UOP/24 hours.  Denies chest pain  Objective: Vitals:   02/03/21 1500 02/04/21 0732 02/04/21 1123 02/04/21 1450  BP: (!) 150/70 (!) 144/59 133/68 (!) 142/57  Pulse: 90 91 92 88  Resp: 17 18 19 18   Temp: (!) 97.3 F (36.3 C) 97.8 F (36.6 C) 97.8 F (36.6 C) 98.1 F (36.7 C)  TempSrc: Axillary     SpO2: 90% 94% 94% 96%  Weight:      Height:        Intake/Output Summary (Last 24 hours) at 02/04/2021 1602 Last data filed at 02/04/2021 1446 Gross per 24 hour  Intake 1440 ml  Output 4550 ml  Net -3110 ml   Filed Weights   02/01/21 1942 02/02/21 0559 02/03/21 0338  Weight: 106.7 kg 106 kg 105.1 kg     Examination:  GENERAL: No apparent distress.  Nontoxic. HEENT: MMM.  Vision and hearing grossly intact.  NECK: Supple.  Difficult to assess JVD due to body habitus. RESP: 96% on 3 L.  No IWOB.  Fair aeration bilaterally. CVS:  RRR. Heart sounds normal.  ABD/GI/GU: BS+. Abd soft.  Diffuse tenderness but no rebound or guarding. MSK/EXT:  Moves extremities. No apparent deformity.  1+ BLE edema. SKIN: no apparent skin lesion or wound NEURO: Awake and alert. Oriented appropriately.  No apparent focal neuro deficit. PSYCH: Calm. Normal affect.   Procedures:  None  Microbiology summarized: [id number] and influenza PCR nonreactive. Urine culture with GNR.  Assessment & Plan: Respiratory distress/acute on chronic diastolic CHF-patient presents with SOB, DOE, orthopnea, edema, chest pressure, abdominal pain, distention and diarrhea.  BNP 935 (56 on 06/19/2020).  CXR with cardiomegaly.  TTE in 06/2020 with LVEF of 60 to 65%, mild to moderate AVS. Started on IV Lasix.  UOP 3 L / 24 hours.  Net -4 L.  Creatinine and sodium improving.  Still with signs of fluid overload although difficult to assess due to body habitus. -Continue IV Lasix 40 mg daily -Monitor fluid status, renal functions and electrolytes. -Sodium and fluid restrictions  Acute metabolic encephalopathy: Unclear etiology of this.  VBG, TSH, ammonia, B12, RPR unrevealing.  Concern about UTI.  Encephalopathy resolved. -Continue monitoring. -Reorientation and delirium precautions.  Possible UTI: Urine culture with E. coli and Klebsiella pneumonia.  -Ceftriaxone 11/30>> p.o. Keflex 12/1>> for 5 more days to complete treatment course.  Chronic COPD/chronic hypoxic respiratory failure-on 3 L at baseline. -Minimum oxygen to keep saturation above 88% regardless of home 3 L. -Continue inhalers and nebulizers  AKI/azotemia CKD-3A: AKI resolved. Recent Labs    10/31/20 6073 11/01/20 0521 11/02/20 0426 11/03/20 0421 11/04/20 0317  11/05/20 0351 02/01/21 1256 02/02/21 0616 02/03/21 0414 02/04/21 0612  BUN 37* 39* 36* 37* 39* 48* 48* 44* 42* 26*  CREATININE 0.94 0.92 1.00 1.02* 1.12* 1.10* 1.28* 1.21* 1.14* 0.82  -Continue IV Lasix as above -Continue holding losartan and HCTZ.  Iron deficiency anemia: No report of GI bleed.  Iron saturation 5%.  Ferritin 9.  TIBC 456.  H&H stable. Recent Labs    10/31/20 7106 11/01/20 0521 11/02/20 0426 11/03/20 0421 11/04/20 0317 11/05/20 0351 02/01/21 1627 02/02/21 0616 02/03/21 0414 02/04/21 0612  HGB 9.4* 8.8* 9.6* 9.4* 10.0* 9.6* 9.1* 8.6* 8.7* 8.8*  -IV ferric gluconate 250 mg daily x2 -Monitor H&H  Uncontrolled IDDM-2 with hyperglycemia and other complications. Recent Labs  Lab 02/03/21 1108 02/03/21 1627 02/03/21 2028 02/04/21 0728 02/04/21 1146  GLUCAP 210* 248* 239* 149* 204*  -Continue basal insulin 30 units daily -Continue SSI-resistant scale -Added NovoLog 4 units 3 times daily with meals -Continue statin. -Follow hemoglobin A1c  Paroxysmal A. Fib: Currently in sinus rhythm.  Not on rate or rhythm control medication likely due to history of bradycardia.. -Continue home Eliquis.  Possible liver cirrhosis without ascites: Noted on CT abdomen and pelvis.  Ultrasound negative for ascites.  Hepatitis panel negative in 06/2020. -Diuretics as above  Essential hypertension: Soft BP for most part -Continue holding losartan and HCTZ. -Diuretics as above  Chronic back chronic pain: Stable. -Continue Tylenol as needed  Hyponatremia: Improved.  Likely hypervolemic.  Could be due to losartan and HCTZ as well. -Continue IV Lasix -Holding losartan and HCTZ as above  Goal of care discussion: Discussed goal of care including status and pros and cons of CPR and intubation with patient earlier this morning.  Patient prefers to be DNR/DNI which is reasonable and appropriate. -Changed CODE STATUS to DNR/DNI.  Morbid obesity Body mass index is 41.04 kg/m.   -PT/OT eval       DVT prophylaxis:   apixaban (ELIQUIS) tablet 5 mg  Code Status: DNR/DNI. Family Communication: Patient and/or RN. Available if any question.  Level of care: Telemetry Cardiac Status is: Inpatient  Remains inpatient appropriate because: Remains inpatient due to CHF exacerbation/fluid overload.  Needs further diuresis with IV Lasix       Consultants:  None   Sch Meds:  Scheduled Meds:  allopurinol  100 mg Oral Daily   apixaban  5 mg Oral BID   atorvastatin  10 mg Oral Daily   bethanechol  10 mg Oral TID   cephALEXin  500 mg Oral Q8H   ferrous sulfate  325 mg Oral Daily   fluticasone  2 spray Each Nare Daily   fluticasone furoate-vilanterol  1 puff Inhalation Daily   furosemide  40 mg Intravenous Daily   insulin aspart  0-20 Units Subcutaneous TID WC   insulin aspart  0-5 Units Subcutaneous QHS   insulin aspart  4 Units Subcutaneous TID WC   insulin glargine-yfgn  30 Units Subcutaneous Daily   montelukast  10 mg Oral QHS   pantoprazole  40 mg Oral Daily   umeclidinium bromide  1 puff  Inhalation Daily   vitamin B-12  500 mcg Oral Daily   Continuous Infusions:  ferric gluconate (FERRLECIT) IVPB 250 mg (02/04/21 1154)   PRN Meds:.acetaminophen, guaiFENesin-dextromethorphan, ipratropium-albuterol, ondansetron **OR** ondansetron (ZOFRAN) IV  Antimicrobials: Anti-infectives (From admission, onward)    Start     Dose/Rate Route Frequency Ordered Stop   02/03/21 1400  cephALEXin (KEFLEX) capsule 500 mg        500 mg Oral Every 8 hours 02/03/21 1027 02/08/21 1359   02/02/21 1400  cefTRIAXone (ROCEPHIN) 2 g in sodium chloride 0.9 % 100 mL IVPB  Status:  Discontinued        2 g 200 mL/hr over 30 Minutes Intravenous Every 24 hours 02/02/21 1311 02/03/21 1027        I have personally reviewed the following labs and images: CBC: Recent Labs  Lab 02/01/21 1627 02/02/21 0616 02/03/21 0414 02/04/21 0612  WBC 11.3* 11.2* 9.6 9.7  HGB 9.1* 8.6* 8.7*  8.8*  HCT 29.6* 28.0* 29.1* 29.5*  MCV 82.0 80.7 82.2 82.6  PLT 306 284 295 285   BMP &GFR Recent Labs  Lab 02/01/21 1256 02/02/21 0616 02/03/21 0414 02/04/21 0612  NA 128* 130* 132* 135  K 5.2* 4.8 4.5 4.1  CL 97* 99 98 99  CO2 24 27 30  32  GLUCOSE 172* 145* 194* 140*  BUN 48* 44* 42* 26*  CREATININE 1.28* 1.21* 1.14* 0.82  CALCIUM 8.9 8.6* 8.5* 8.6*  MG  --   --  2.0 1.7  PHOS  --   --  4.1 3.2   Estimated Creatinine Clearance: 67.7 mL/min (by C-G formula based on SCr of 0.82 mg/dL). Liver & Pancreas: Recent Labs  Lab 02/03/21 0414 02/04/21 0612  ALBUMIN 3.0* 3.1*   No results for input(s): LIPASE, AMYLASE in the last 168 hours. Recent Labs  Lab 02/02/21 1154 02/03/21 0414  AMMONIA 36* 30   Diabetic: Recent Labs    02/01/21 1627 02/03/21 0414  HGBA1C 10.1* 9.8*   Recent Labs  Lab 02/03/21 1108 02/03/21 1627 02/03/21 2028 02/04/21 0728 02/04/21 1146  GLUCAP 210* 248* 239* 149* 204*   Cardiac Enzymes: Recent Labs  Lab 02/03/21 0414  CKTOTAL 83   No results for input(s): PROBNP in the last 8760 hours. Coagulation Profile: No results for input(s): INR, PROTIME in the last 168 hours. Thyroid Function Tests: Recent Labs    02/02/21 1154  TSH 3.489   Lipid Profile: No results for input(s): CHOL, HDL, LDLCALC, TRIG, CHOLHDL, LDLDIRECT in the last 72 hours. Anemia Panel: Recent Labs    02/02/21 1154 02/04/21 0612  VITAMINB12 479 613  FOLATE  --  12.7  FERRITIN  --  9*  TIBC  --  456*  IRON  --  22*  RETICCTPCT  --  1.7   Urine analysis:    Component Value Date/Time   COLORURINE YELLOW 02/02/2021 1200   APPEARANCEUR HAZY (A) 02/02/2021 1200   APPEARANCEUR Cloudy (A) 09/28/2017 1338   LABSPEC 1.015 02/02/2021 1200   LABSPEC 1.024 04/02/2014 0517   PHURINE 5.5 02/02/2021 1200   GLUCOSEU NEGATIVE 02/02/2021 1200   GLUCOSEU >=500 04/02/2014 0517   HGBUR TRACE (A) 02/02/2021 1200   BILIRUBINUR NEGATIVE 02/02/2021 1200   BILIRUBINUR  Negative 09/28/2017 1338   BILIRUBINUR Negative 04/02/2014 [place] 02/02/2021 1200   PROTEINUR NEGATIVE 02/02/2021 1200   NITRITE NEGATIVE 02/02/2021 1200   LEUKOCYTESUR SMALL (A) 02/02/2021 1200   LEUKOCYTESUR Trace 04/02/2014 0517   Sepsis Labs: Invalid  input(s): PROCALCITONIN, LACTICIDVEN  Microbiology: Recent Results (from the past 240 hour(s))  Resp Panel by RT-PCR (Flu A&B, Covid) Nasopharyngeal Swab     Status: None   Collection Time: 02/01/21 12:56 PM   Specimen: Nasopharyngeal Swab; Nasopharyngeal(NP) swabs in vial transport medium  Result Value Ref Range Status   SARS Coronavirus 2 by RT PCR NEGATIVE NEGATIVE Final    Comment: (NOTE) SARS-CoV-2 target nucleic acids are NOT DETECTED.  The SARS-CoV-2 RNA is generally detectable in upper respiratory specimens during the acute phase of infection. The lowest concentration of SARS-CoV-2 viral copies this assay can detect is 138 copies/mL. A negative result does not preclude SARS-Cov-2 infection and should not be used as the sole basis for treatment or other patient management decisions. A negative result may occur with  improper specimen collection/handling, submission of specimen other than nasopharyngeal swab, presence of viral mutation(s) within the areas targeted by this assay, and inadequate number of viral copies(<138 copies/mL). A negative result must be combined with clinical observations, patient history, and epidemiological information. The expected result is Negative.  Fact Sheet for Patients:  EntrepreneurPulse.com.au  Fact Sheet for Healthcare Providers:  IncredibleEmployment.be  This test is no t yet approved or cleared by the [place] FDA and  has been authorized for detection and/or diagnosis of SARS-CoV-2 by FDA under an Emergency Use Authorization (EUA). This EUA will remain  in effect (meaning this test can be used) for the duration of  the COVID-19 declaration under Section 564(b)(1) of the Act, 21 U.S.C.section 360bbb-3(b)(1), unless the authorization is terminated  or revoked sooner.       Influenza A by PCR NEGATIVE NEGATIVE Final   Influenza B by PCR NEGATIVE NEGATIVE Final    Comment: (NOTE) The Xpert Xpress SARS-CoV-2/FLU/RSV plus assay is intended as an aid in the diagnosis of influenza from Nasopharyngeal swab specimens and should not be used as a sole basis for treatment. Nasal washings and aspirates are unacceptable for Xpert Xpress SARS-CoV-2/FLU/RSV testing.  Fact Sheet for Patients: EntrepreneurPulse.com.au  Fact Sheet for Healthcare Providers: IncredibleEmployment.be  This test is not yet approved or cleared by the [place] FDA and has been authorized for detection and/or diagnosis of SARS-CoV-2 by FDA under an Emergency Use Authorization (EUA). This EUA will remain in effect (meaning this test can be used) for the duration of the COVID-19 declaration under Section 564(b)(1) of the Act, 21 U.S.C. section 360bbb-3(b)(1), unless the authorization is terminated or revoked.  Performed at [facility], [address] [place], [place] [address]   Urine Culture     Status: Abnormal   Collection Time: 02/01/21  1:42 PM   Specimen: Urine, Catheterized  Result Value Ref Range Status   Specimen Description   Final    URINE, CATHETERIZED Performed at [facility], [address], [place], [place] [address]    Special Requests   Final    NONE Performed at [facility], [address] [place], [place] [address]    Culture (A)  Final    >=100,000 COLONIES/mL ESCHERICHIA COLI 80,000 COLONIES/mL KLEBSIELLA PNEUMONIAE    Report Status 02/03/2021 FINAL  Final   Organism ID, Bacteria ESCHERICHIA COLI (A)  Final   Organism ID, Bacteria KLEBSIELLA PNEUMONIAE (A)  Final      Susceptibility   Escherichia coli - MIC*    AMPICILLIN 8  SENSITIVE Sensitive     CEFAZOLIN <=4 SENSITIVE Sensitive     CEFEPIME <=0.12 SENSITIVE Sensitive     CEFTRIAXONE <=  0.25 SENSITIVE Sensitive     CIPROFLOXACIN <=0.25 SENSITIVE Sensitive     GENTAMICIN <=1 SENSITIVE Sensitive     IMIPENEM <=0.25 SENSITIVE Sensitive     NITROFURANTOIN <=16 SENSITIVE Sensitive     TRIMETH/SULFA <=20 SENSITIVE Sensitive     AMPICILLIN/SULBACTAM 4 SENSITIVE Sensitive     PIP/TAZO <=4 SENSITIVE Sensitive     * >=100,000 COLONIES/mL ESCHERICHIA COLI   Klebsiella pneumoniae - MIC*    AMPICILLIN >=32 RESISTANT Resistant     CEFAZOLIN <=4 SENSITIVE Sensitive     CEFEPIME <=0.12 SENSITIVE Sensitive     CEFTRIAXONE <=0.25 SENSITIVE Sensitive     CIPROFLOXACIN <=0.25 SENSITIVE Sensitive     GENTAMICIN <=1 SENSITIVE Sensitive     IMIPENEM <=0.25 SENSITIVE Sensitive     NITROFURANTOIN 64 INTERMEDIATE Intermediate     TRIMETH/SULFA <=20 SENSITIVE Sensitive     AMPICILLIN/SULBACTAM 4 SENSITIVE Sensitive     PIP/TAZO <=4 SENSITIVE Sensitive     * 80,000 COLONIES/mL KLEBSIELLA PNEUMONIAE    Radiology Studies: No results found.   Yomayra Tate T. [place]  If 7PM-7AM, please contact night-coverage [email or web address] 02/04/2021, 4:02 PM

## 2021-02-04 NOTE — Progress Notes (Signed)
Inpatient Diabetes Program Recommendations  AACE/ADA: New Consensus Statement on Inpatient Glycemic Control   Target Ranges:  Prepandial:   less than 140 mg/dL      Peak postprandial:   less than 180 mg/dL (1-2 hours)      Critically ill patients:  140 - 180 mg/dL    Latest Reference Range & Units 02/04/21 07:28 02/04/21 11:46  Glucose-Capillary 70 - 99 mg/dL 149 (H) 204 (H)    Latest Reference Range & Units 02/03/21 07:56 02/03/21 11:08 02/03/21 16:27 02/03/21 20:28  Glucose-Capillary 70 - 99 mg/dL 190 (H) 210 (H) 248 (H) 239 (H)   Review of Glycemic Control  Diabetes history: DM2 Outpatient Diabetes medications:Lantus 38 units daily, Humalog 10 units TID with meals, Metformin 1000 mg BID Current orders for Inpatient glycemic control: Semglee 30 units daily, Novolog 0-20 units TID with meals, Novolog 0-5 units QHS, Novolog 4 units TID with meals  Inpatient Diabetes Program Recommendations:    Insulin: Please consider increasing meal coverage to Novolog 7 units TID with meals if patient eats at least 50% of meals.  Thanks, Barnie Alderman, RN, MSN, CDE Diabetes Coordinator Inpatient Diabetes Program [phone] (Team Pager from 8am to 5pm)

## 2021-02-04 NOTE — Plan of Care (Signed)
?  Problem: Education: ?Goal: Ability to verbalize understanding of medication therapies will improve ?Outcome: Progressing ?  ?Problem: Activity: ?Goal: Capacity to carry out activities will improve ?Outcome: Progressing ?  ?Problem: Cardiac: ?Goal: Ability to achieve and maintain adequate cardiopulmonary perfusion will improve ?Outcome: Progressing ?  ?

## 2021-02-04 NOTE — Progress Notes (Signed)
Physical Therapy Treatment Patient Details Name: Karen [name] MRN: [id number] DOB: 02/06/1946 Today's Date: 02/04/2021   History of Present Illness 75 year old F with PMH of COPD, chronic hypoxic RF on 3 L, diastolic CHF, moderate aortic stenosis, A. fib, DM-2 and morbid obesity presenting with decreased urine output, SOB, DOE, orthopnea, edema, chest pressure, abdominal pain, distention and diarrhea, and admitted with working diagnosis of acute diastolic CHF.    PT Comments    Pt was returning to EOB form [facility] upon arriving. RN tech in room assisting. She agrees to PT session and is cooperative throughout. On 3 L o2 [place] however sao2 in mid 80s. She required increased O2 during standing activity to 6 L. Prolonged time to recover after ambulating 15 ft around bed to recliner. Sao2 eventually recovers to >90% with prolonged recovery time. At conclusion of session, pt was on 4 L o2 sitting up in recliner with sao2 92%. PT continues to recommend DC to SNF to address deficits while maximizing independence with ADLs. Acute PT will continue to follow and progress as able per current POC.    Recommendations for follow up therapy are one component of a multi-disciplinary discharge planning process, led by the attending physician.  Recommendations may be updated based on patient status, additional functional criteria and insurance authorization.  Follow Up Recommendations  Skilled nursing-short term rehab (<3 hours/day)     Assistance Recommended at Discharge Intermittent Supervision/Assistance  Equipment Recommendations  Other (comment) (defer to next level of care)       Precautions / Restrictions Precautions Precautions: Fall Restrictions Weight Bearing Restrictions: No     Mobility  Bed Mobility    General bed mobility comments: pt was just returning to bed form [facility] upon arriving. Did not formally assesspt's bed mobility    Transfers Overall transfer level: Needs  assistance Equipment used: Rolling walker (2 wheels) Transfers: Sit to/from Stand Sit to Stand: Min assist;Mod assist           General transfer comment: pt continues to require assistance to achieve standing form EOB and from recliner surface    Ambulation/Gait Ambulation/Gait assistance: Min assist Gait Distance (Feet): 15 Feet Assistive device: Rolling walker (2 wheels) Gait Pattern/deviations: Step-to pattern;Trunk flexed;Narrow base of support;Decreased stride length Gait velocity: decreased     General Gait Details: pt was able to ambulate ~ 15 ft however has to have increased O2 throughout due to sao2 desaturating into 70s. took several minutes for pt to recover to > 88%. was able to return to 4 L but required 6 L to recover after ambulation.       Balance Overall balance assessment: Needs assistance Sitting-balance support: Feet supported Sitting balance-Leahy Scale: Good     Standing balance support: During functional activity;Bilateral upper extremity supported;Reliant on assistive device for balance Standing balance-Leahy Scale: Fair Standing balance comment: pt does rely on RW during all dynamic standing activity         Cognition Arousal/Alertness: Awake/alert Behavior During Therapy: WFL for tasks assessed/performed Overall Cognitive Status: Within Functional Limits for tasks assessed               Pertinent Vitals/Pain Pain Assessment: 0-10 Pain Score: 4  Pain Location: knees Pain Descriptors / Indicators: Aching;Discomfort Pain Intervention(s): Limited activity within patient's tolerance;Monitored during session;Repositioned     PT Goals (current goals can now be found in the care plan section) Acute Rehab PT Goals Patient Stated Goal: [facility] at [facility] Progress towards PT goals: Progressing toward goals  Frequency    Min 2X/week      PT Plan Current plan remains appropriate       AM-PAC PT "6 Clicks" Mobility   Outcome  Measure  Help needed turning from your back to your side while in a flat bed without using bedrails?: A Little Help needed moving from lying on your back to sitting on the side of a flat bed without using bedrails?: A Little Help needed moving to and from a bed to a chair (including a wheelchair)?: A Lot Help needed standing up from a chair using your arms (e.g., wheelchair or bedside chair)?: A Lot Help needed to walk in hospital room?: A Little Help needed climbing 3-5 steps with a railing? : A Lot 6 Click Score: 15    End of Session Equipment Utilized During Treatment: Gait belt;Oxygen Activity Tolerance: Patient limited by fatigue Patient left: in chair;with chair alarm set Nurse Communication: Mobility status PT Visit Diagnosis: Muscle weakness (generalized) (M62.81);Unsteadiness on feet (R26.81);Difficulty in walking, not elsewhere classified (R26.2)     Time: 1771-1657 PT Time Calculation (min) (ACUTE ONLY): 25 min  Charges:  $Gait Training: 8-22 mins $Therapeutic Activity: 8-22 mins                    Julaine Fusi PTA 02/04/21, 9:44 AM

## 2021-02-04 NOTE — TOC Progression Note (Addendum)
Transition of Care [facility]) - Progression Note    Patient Details  Name: Karen [name] MRN: [id number] Date of Birth: Mar 05, 1946  [facility]) CM/SW [place], [place] Phone Number: 02/04/2021, 9:14 AM  Clinical Narrative:     Update: Insurance auth received today, MD reports patient not medically ready to dc today. Pending medical readiness to dc to [facility] potentially tomorrow. Should patient be ready tomorrow, DC summary will need to be faxed to [phone] to Nurse CJ McDade. Room E14 call main number for report.    CSW notes [facility] accepted for bed offer, insurance Josem Kaufmann has been started today via [employer] portal.   Pending insurance auth.   Expected Discharge Plan: Skilled Nursing Facility Barriers to Discharge: Continued Medical Work up  Expected Discharge Plan and Services Expected Discharge Plan: [place] Choice: [place] arrangements for the past 2 months: Single Family Home                                       Social Determinants of Health (SDOH) Interventions    Readmission Risk Interventions Readmission Risk Prevention Plan 10/30/2020 06/21/2020 09/30/2019  Transportation Screening Complete Complete Complete  PCP or Specialist Appt within 5-7 Days - - -  PCP or Specialist Appt within 3-5 Days - - Complete  Home Care Screening - - -  Medication Review (RN CM) - - -  Social Work Scientific laboratory technician for [place] Planning/Counseling - - Complete  Palliative Care Screening - - Not Applicable  Medication Review Press photographer) Complete Complete Complete  PCP or Specialist appointment within 3-5 days of discharge Complete Complete -  PCP/Specialist Appt Not Complete comments - - -  HRI or Home Care Consult Patient refused Complete -  SW Recovery Care/Counseling Consult Complete Complete -  Palliative Care Screening Not Applicable Not Applicable -  [place] Not  Applicable Not Applicable -  Some recent data might be hidden

## 2021-02-04 NOTE — Progress Notes (Signed)
Occupational Therapy Treatment Patient Details Name: Karen [name] MRN: [id number] DOB: 1945-05-18 Today's Date: 02/04/2021   History of present illness 75 year old F with PMH of COPD, chronic hypoxic RF on 3 L, diastolic CHF, moderate aortic stenosis, A. fib, DM-2 and morbid obesity presenting with decreased urine output, SOB, DOE, orthopnea, edema, chest pressure, abdominal pain, distention and diarrhea, and admitted with working diagnosis of acute diastolic CHF.   OT comments  Pt seen for OT treatment this date to f/u re: safety with ADLs/ADL mobility. Pt requires MIN A to come to sitting and demos G sitting balance. Pt requires SETUP and cues to participate in oral care seated EOB. Pt declines to attempt standing citing fatigue. OT engages pt in in sit to sup with MOD A. She requires MOD A +2 to propel towards [facility] with use of draw sheet. Left with all needs met and in reach. O2 stable throughout on nasal cannula, but activity limited (mindful of pt significantly de-saturating during PT session earlier this date). RN presents to take nasal swab test. Will continue to follow acutely. Continue to anticipate pt will require STR f/u OT services.    Recommendations for follow up therapy are one component of a multi-disciplinary discharge planning process, led by the attending physician.  Recommendations may be updated based on patient status, additional functional criteria and insurance authorization.    Follow Up Recommendations  Skilled nursing-short term rehab (<3 hours/day)    Assistance Recommended at Discharge Frequent or constant Supervision/Assistance  Equipment Recommendations  Other (comment) (defer)    Recommendations for Other Services      Precautions / Restrictions Precautions Precautions: Fall Restrictions Weight Bearing Restrictions: No       Mobility Bed Mobility Overal bed mobility: Needs Assistance Bed Mobility: Supine to Sit;Sit to Supine     Supine to sit:  Min assist Sit to supine: Max assist   General bed mobility comments: MAX A for LE mgt back to bed    Transfers                   General transfer comment: deferred     Balance Overall balance assessment: Needs assistance Sitting-balance support: Feet supported Sitting balance-Leahy Scale: Good         Standing balance comment: deferred                           ADL either performed or assessed with clinical judgement   ADL Overall ADL's : Needs assistance/impaired     Grooming: Oral care;Set up;Sitting                                      Extremity/Trunk Assessment              Vision       Perception     Praxis      Cognition Arousal/Alertness: Awake/alert Behavior During Therapy: WFL for tasks assessed/performed;Flat affect Overall Cognitive Status: Within Functional Limits for tasks assessed                                            Exercises Other Exercises Other Exercises: OT engages pt in sup to sit and seated g/h tasks   Shoulder Instructions  General Comments spO2 >91% throughout on nasal cannula    Pertinent Vitals/ Pain       Pain Assessment: 0-10 Pain Score: 4  Pain Location: knees Pain Descriptors / Indicators: Aching;Discomfort Pain Intervention(s): Limited activity within patient's tolerance;Monitored during session;Repositioned  Home Living                                          Prior Functioning/Environment              Frequency  Min 2X/week        Progress Toward Goals  OT Goals(current goals can now be found in the care plan section)  Progress towards OT goals: Progressing toward goals  Acute Rehab OT Goals Patient Stated Goal: to return home OT Goal Formulation: With patient Time For Goal Achievement: 02/16/21 Potential to Achieve Goals: Good  Plan Discharge plan remains appropriate    Co-evaluation                  AM-PAC OT "6 Clicks" Daily Activity     Outcome Measure   Help from another person eating meals?: None Help from another person taking care of personal grooming?: A Little Help from another person toileting, which includes using toliet, bedpan, or urinal?: A Lot Help from another person bathing (including washing, rinsing, drying)?: A Lot Help from another person to put on and taking off regular upper body clothing?: A Little Help from another person to put on and taking off regular lower body clothing?: A Lot 6 Click Score: 16    End of Session    OT Visit Diagnosis: Unsteadiness on feet (R26.81);Repeated falls (R29.6);Muscle weakness (generalized) (M62.81)   Activity Tolerance Patient limited by fatigue   Patient Left in bed;with call bell/phone within reach;with bed alarm set   Nurse Communication Mobility status        Time: 4327-6147 OT Time Calculation (min): 23 min  Charges: OT General Charges $OT Visit: 1 Visit OT Treatments $Self Care/Home Management : 8-22 mins $Therapeutic Activity: 8-22 mins  Gerrianne Scale, MS, OTR/L ascom [phone] 02/04/21, 5:22 PM

## 2021-02-05 ENCOUNTER — Inpatient Hospital Stay: Payer: Medicare Other

## 2021-02-05 DIAGNOSIS — I5031 Acute diastolic (congestive) heart failure: Secondary | ICD-10-CM | POA: Diagnosis not present

## 2021-02-05 DIAGNOSIS — I480 Paroxysmal atrial fibrillation: Secondary | ICD-10-CM | POA: Diagnosis not present

## 2021-02-05 DIAGNOSIS — R7989 Other specified abnormal findings of blood chemistry: Secondary | ICD-10-CM | POA: Diagnosis not present

## 2021-02-05 DIAGNOSIS — I350 Nonrheumatic aortic (valve) stenosis: Secondary | ICD-10-CM | POA: Diagnosis not present

## 2021-02-05 DIAGNOSIS — N179 Acute kidney failure, unspecified: Secondary | ICD-10-CM

## 2021-02-05 DIAGNOSIS — E871 Hypo-osmolality and hyponatremia: Secondary | ICD-10-CM

## 2021-02-05 DIAGNOSIS — K7030 Alcoholic cirrhosis of liver without ascites: Secondary | ICD-10-CM

## 2021-02-05 LAB — GLUCOSE, CAPILLARY
Glucose-Capillary: 148 mg/dL — ABNORMAL HIGH (ref 70–99)
Glucose-Capillary: 266 mg/dL — ABNORMAL HIGH (ref 70–99)
Glucose-Capillary: 166 mg/dL — ABNORMAL HIGH (ref 70–99)
Glucose-Capillary: 185 mg/dL — ABNORMAL HIGH (ref 70–99)

## 2021-02-05 LAB — RENAL FUNCTION PANEL
Albumin: 3 g/dL — ABNORMAL LOW (ref 3.5–5.0)
Anion gap: 9 (ref 5–15)
BUN: 18 mg/dL (ref 8–23)
CO2: 33 mmol/L — ABNORMAL HIGH (ref 22–32)
Calcium: 8.6 mg/dL — ABNORMAL LOW (ref 8.9–10.3)
Chloride: 94 mmol/L — ABNORMAL LOW (ref 98–111)
Creatinine, Ser: 0.66 mg/dL (ref 0.44–1.00)
GFR, Estimated: >60 mL/min (ref >60)
Glucose, Bld: 155 mg/dL — ABNORMAL HIGH (ref 70–99)
Phosphorus: 3.8 mg/dL (ref 2.5–4.6)
Potassium: 3.8 mmol/L (ref 3.5–5.1)
Sodium: 136 mmol/L (ref 135–145)

## 2021-02-05 LAB — CBC
HCT: 29.8 % — ABNORMAL LOW (ref 36.0–46.0)
Hemoglobin: 9.2 g/dL — ABNORMAL LOW (ref 12.0–15.0)
MCH: 25.8 pg — ABNORMAL LOW (ref 26.0–34.0)
MCHC: 30.9 g/dL (ref 30.0–36.0)
MCV: 83.5 fL (ref 80.0–100.0)
Platelets: 267 10*3/uL (ref 150–400)
RBC: 3.57 MIL/uL — ABNORMAL LOW (ref 3.87–5.11)
RDW: 17.4 % — ABNORMAL HIGH (ref 11.5–15.5)
WBC: 9.8 10*3/uL (ref 4.0–10.5)
nRBC: 0 % (ref 0.0–0.2)

## 2021-02-05 LAB — MAGNESIUM: Magnesium: 2 mg/dL (ref 1.7–2.4)

## 2021-02-05 LAB — BRAIN NATRIURETIC PEPTIDE: B Natriuretic Peptide: 302.3 pg/mL — ABNORMAL HIGH (ref 0.0–100.0)

## 2021-02-05 MED ORDER — DIPHENHYDRAMINE HCL 25 MG PO CAPS
25.0000 mg | ORAL_CAPSULE | ORAL | Status: DC | PRN
  Administered 2021-02-05: 25 mg via ORAL
  Filled 2021-02-05: qty 1

## 2021-02-05 MED ORDER — FUROSEMIDE 40 MG PO TABS: 40.0000 mg | ORAL_TABLET | Freq: Every day | ORAL | 1 refills | Status: DC

## 2021-02-05 MED ORDER — GUAIFENESIN ER 600 MG PO TB12: 600.0000 mg | ORAL_TABLET | Freq: Two times a day (BID) | ORAL | 0 refills | Status: DC | PRN

## 2021-02-05 MED ORDER — ACETAMINOPHEN 500 MG PO TABS: 500.0000 mg | ORAL_TABLET | Freq: Four times a day (QID) | ORAL | 0 refills | Status: AC | PRN

## 2021-02-05 MED ORDER — DIPHENHYDRAMINE HCL 25 MG PO CAPS: 25.0000 mg | ORAL_CAPSULE | Freq: Four times a day (QID) | ORAL | Status: DC | PRN

## 2021-02-05 MED ORDER — IRON 325 (65 FE) MG PO TABS: 1.0000 | ORAL_TABLET | Freq: Two times a day (BID) | ORAL | 11 refills | Status: DC

## 2021-02-05 MED ORDER — IOHEXOL 350 MG/ML SOLN
75.0000 mL | Freq: Once | INTRAVENOUS | Status: AC | PRN
  Administered 2021-02-05: 75 mL via INTRAVENOUS

## 2021-02-05 MED ORDER — SENNOSIDES-DOCUSATE SODIUM 8.6-50 MG PO TABS: 1.0000 | ORAL_TABLET | Freq: Two times a day (BID) | ORAL | 0 refills | Status: DC | PRN

## 2021-02-05 MED ORDER — CEPHALEXIN 500 MG PO CAPS: 500.0000 mg | ORAL_CAPSULE | Freq: Three times a day (TID) | ORAL | 0 refills | Status: DC

## 2021-02-05 NOTE — Progress Notes (Signed)
Mobility Specialist - Progress Note   02/05/21 1100  Mobility  Activity Ambulated in room  Level of Assistance Minimal assist, patient does 75% or more  Assistive Device Front wheel walker  Distance Ambulated (ft) 20 ft  Mobility Ambulated with assistance in room  Mobility Response Tolerated well  Mobility performed by Mobility specialist  $Mobility charge 1 Mobility    O2 while resting on RA = 86% O2 while AMB on RA = n/a O2 while AMB on 8L = 88%   Pt sitting EOB on arrival, utilizing 4L. Weaned to RA with saturations dropping. Reapplied on 4L to get sats to improve to 91% prior to activity. ModA and extra time to stand. SOB with exertion. O2 desat to 82% on 6L and oxygen was increased to 8L for recovery >88%. Extensive seated rest break taken. Once returned supine O2 desat again to a low of 65% with HOB flat---quick recovery once HOB elevated. Prior to exit, pt voiced mild on/off chest pain and stated that RN and MD were already made aware. RPE 9/10. Pt left in bed with alarm set. O2 91% at exit on 4.5L.    Kathee Delton Mobility Specialist 02/05/21, 11:44 AM

## 2021-02-05 NOTE — Plan of Care (Signed)

## 2021-02-05 NOTE — Progress Notes (Addendum)
Secure chat sent to J. Mansy for rhythm change. Patient HR 98-101- afib on monitor. Eariler was NSR- 90s. Reviewing pt's chart she does have a history of a fib and is currently on eliquis.  FYI on call MD. [phone]- No new orders. Argie Ramming MD aware

## 2021-02-05 NOTE — Progress Notes (Signed)
SATURATION QUALIFICATIONS: (This note is used to comply with regulatory documentation for home oxygen)  Patient Saturations on Room Air at Rest = N/A  Patient Saturations on Room Air while Ambulating =NA%  Patient Saturations on 10 Liters of oxygen while Ambulating = 84%  Please briefly explain why patient needs home oxygen: pt is currently on 6L sating 92% resting

## 2021-02-05 NOTE — Discharge Summary (Signed)
Physician Discharge Summary  Karen [name]:[id number] DOB: 03/09/1945 DOA: 02/01/2021  PCP: Earlie Counts, FNP  Admit date: 02/01/2021 Discharge date: 02/05/2021 Admitted From: Home Disposition: SNF Recommendations for Outpatient Follow-up:  Follow ups as below. Please obtain CBC/BMP/Mag at follow up Please follow up on the following pending results: None  Discharge Condition: Stable CODE STATUS: DNR/DNI  Follow-up Information     D, Ples Specter, FNP. Schedule an appointment as soon as possible for a visit in 1 week(s).   Specialty: Family Medicine Contact information: [place] [address] [phone]         [name], MD. Schedule an appointment as soon as possible for a visit in 2 week(s).   Specialty: Cardiology Contact information: [place] [address] [phone]                Hospital Course: 75 year old F with PMH of COPD, chronic hypoxic RF on 3 L, diastolic CHF, moderate aortic stenosis, A. fib, DM-2 and morbid obesity presenting with decreased urine output, SOB, DOE, orthopnea, edema, chest pressure, abdominal pain, distention and diarrhea, and admitted with working diagnosis of acute diastolic CHF.  BNP elevated to 935.  CXR with cardiomegaly.  Foley catheter placed in ED.  Patient received IV Lasix in ER.  CT chest ordered on admission, and showed improved pleural effusion, cardiomegaly and probable liver cirrhosis.    Patient also had encephalopathy the next day.  UA concerning for UTI. Urine culture with E. coli and Klebsiella pneumonia.  Antibiotics de-escalated to p.o. Keflex.  She is discharged on p.o. Keflex for 3 more days.  Encephalopathy resolved.  In regards to CHF, she was diuresed with IV Lasix and had net -6 L.  AKI resolved.  Shortness of breath and abdominal pain/distention resolved.  Discharged on p.o. Lasix as below.  See individual problem list below for more on hospital  course.  Discharge Diagnoses:  Respiratory distress/acute on chronic diastolic CHF-patient presents with SOB, DOE, orthopnea, edema, chest pressure, abdominal pain, distention and diarrhea.  BNP 935 (56 on 06/19/2020).  CXR with cardiomegaly.  TTE in 06/2020 with LVEF of 60 to 65%, mild to moderate AVS. Started on IV Lasix.  UOP 3 L / 24 hours.  Net -6 L.  Weight down from 235 pounds to 226 pounds.  Hyponatremia and AKI resolved.  Discharged on p.o. Lasix 40 mg daily with an option to take additional 40 as needed.  Discontinued HCTZ.  Recommend daily weight, and sodium and fluid restrictions. -Recheck renal function and fluid status in about a week -Outpatient follow-up with cardiology   Acute metabolic encephalopathy: Unclear etiology of this.  VBG, TSH, ammonia, B12, RPR unrevealing. Concern about UTI.  Encephalopathy resolved.   Possible UTI: Urine culture with E. coli and Klebsiella pneumonia.  -Ceftriaxone 11/30>> p.o. Keflex 12/1-12/5   Chronic COPD/chronic hypoxic respiratory failure-on 3 L at baseline. -Minimum oxygen to keep saturation above 88% regardless of home 3 L. -Continue inhalers and nebulizers   AKI/azotemia CKD-3A: AKI resolved. Recent Labs    11/01/20 0521 11/02/20 0426 11/03/20 0421 11/04/20 0317 11/05/20 0351 02/01/21 1256 02/02/21 0616 02/03/21 0414 02/04/21 0612 02/05/21 0531  BUN 39* 36* 37* 39* 48* 48* 44* 42* 26* 18  CREATININE 0.92 1.00 1.02* 1.12* 1.10* 1.28* 1.21* 1.14* 0.82 0.66  -Discontinued HCTZ -Recheck renal function in about a week   Iron deficiency anemia: No report of GI bleed.  Iron saturation 5%.  Ferritin 9.  TIBC 456.  H&H stable. Recent Labs    11/01/20 0521 11/02/20 0426 11/03/20 0421 11/04/20 0317 11/05/20 0351 02/01/21 1627 02/02/21 0616 02/03/21 0414 02/04/21 0612 02/05/21 0531  HGB 8.8* 9.6* 9.4* 10.0* 9.6* 9.1* 8.6* 8.7* 8.8* 9.2*  -Received IV ferric gluconate 250 mg x 1 and IV Feraheme 510 mg x 1 -Discharged on p.o.  ferrous sulfate with bowel regimen. -Recheck CBC at follow-up.   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  A1c 9.8%. Recent Labs  Lab 02/04/21 0728 02/04/21 1146 02/04/21 1636 02/04/21 2006 02/05/21 0822  GLUCAP 149* 204* 207* 115* 148*  -Discharged on home meds-reviewed and appropriate.   Paroxysmal A. Fib: Currently in sinus rhythm.  Not on rate or rhythm control medication likely due to history of bradycardia and allergy to beta-blocker. -Continue home Eliquis. -Outpatient follow-up with cardiology   Possible liver cirrhosis without ascites: Noted on CT abdomen and pelvis.  Ultrasound negative for ascites.  Hepatitis panel negative in 06/2020. -Diuretics as above   Essential hypertension: Soft BP for most part -Resume home losartan -Lasix as above.   Chronic back chronic pain: Stable. -Continue Tylenol as needed   Hyponatremia: Resolved with diuretics. -Continue IV Lasix -Discontinued HCTZ.   Goal of care discussion: Discussed goal of care including status and pros and cons of CPR and intubation with patient earlier this morning.  Patient prefers to be DNR/DNI which is reasonable and appropriate. -Changed CODE STATUS to DNR/DNI  Morbid obesity Body mass index is 40.15 kg/m.  -Encourage lifestyle change to lose weight. -Already on Ozempic         Discharge Exam: Vitals:   02/04/21 2355 02/05/21 0435 02/05/21 0444 02/05/21 0700  BP: 139/62 (!) 143/63  (!) 142/66  Pulse: 91 91  94  Temp: 98.8 F (37.1 C) 99 F (37.2 C)  98.4 F (36.9 C)  Resp: 20 16  17   Height:      Weight:   102.8 kg   SpO2: 95% 96%  95%  TempSrc:      BMI (Calculated):   40.16      GENERAL: No apparent distress.  Nontoxic. HEENT: MMM.  Vision and hearing grossly intact.  NECK: Supple.  No apparent JVD.  RESP: 95% on 3 L.  No IWOB.  Fair aeration bilaterally. CVS:  RRR. Heart sounds normal.  ABD/GI/GU: Bowel sounds present. Soft. Non tender.  MSK/EXT:  Moves  extremities. No apparent deformity. No edema.  SKIN: no apparent skin lesion or wound NEURO: Awake and alert.  Oriented appropriately.  No apparent focal neuro deficit. PSYCH: Calm. Normal affect.   Discharge Instructions  Discharge Instructions     Diet - low sodium heart healthy   Complete by: As directed    Fluid restriction to less than 1500 cc a day   Diet Carb Modified   Complete by: As directed    Increase activity slowly   Complete by: As directed       Allergies as of 02/05/2021       Reactions   Ace Inhibitors Hives   Beta Adrenergic Blockers    Junctional bradycardia   Gabapentin Hives   Lisinopril Hives   Lyrica [pregabalin] Hives   Shrimp [shellfish Allergy] Swelling   Swelling of the lips        Medication List     STOP taking these medications    benzonatate 100 MG capsule Commonly known as: TESSALON   guaiFENesin-dextromethorphan 100-10 MG/5ML syrup Commonly known as:  ROBITUSSIN DM   hydrochlorothiazide 25 MG tablet Commonly known as: HYDRODIURIL   oxyCODONE 5 MG immediate release tablet Commonly known as: Oxy IR/ROXICODONE   promethazine-dextromethorphan 6.25-15 MG/5ML syrup Commonly known as: PROMETHAZINE-DM       TAKE these medications    acetaminophen 500 MG tablet Commonly known as: TYLENOL Take 1-2 tablets (500-1,000 mg total) by mouth every 6 (six) hours as needed for mild pain, fever, moderate pain or headache. Do not take more than 4 grams a day What changed: reasons to take this   albuterol 108 (90 Base) MCG/ACT inhaler Commonly known as: VENTOLIN HFA Inhale 2 puffs into the lungs every 6 (six) hours as needed for wheezing or shortness of breath.   allopurinol 100 MG tablet Commonly known as: ZYLOPRIM Take 1 tablet by mouth daily.   atorvastatin 10 MG tablet Commonly known as: LIPITOR Take 1 tablet by mouth daily.   Dulaglutide 3 MG/0.5ML Sopn Inject 3 mg into the skin every Wednesday.   Eliquis 5 MG Tabs  tablet Generic drug: apixaban Take 1 tablet by mouth twice daily   esomeprazole 40 MG capsule Commonly known as: NEXIUM Take 40 mg by mouth daily.   fluticasone 50 MCG/ACT nasal spray Commonly known as: FLONASE Place 2 sprays into both nostrils daily.   Fluticasone-Umeclidin-Vilant 100-62.5-25 MCG/INH Aepb Inhale 1 puff into the lungs daily.   furosemide 40 MG tablet Commonly known as: Lasix Take 1 tablet (40 mg total) by mouth daily. May take additional dose in the afternoon if more swelling, SOB or about 2 lbs weight gain What changed: additional instructions   guaiFENesin 600 MG 12 hr tablet Commonly known as: Mucinex Take 1 tablet (600 mg total) by mouth 2 (two) times daily as needed for up to 5 days for to loosen phlegm or cough.   insulin lispro 100 UNIT/ML KwikPen Commonly known as: HUMALOG Inject 10 Units into the skin in the morning, at noon, and at bedtime.   ipratropium 0.06 % nasal spray Commonly known as: ATROVENT Place 2 sprays into both nostrils 4 (four) times daily.   ipratropium-albuterol 0.5-2.5 (3) MG/3ML Soln Commonly known as: DUONEB Take 3 mLs by nebulization 4 (four) times daily as needed.   Iron 325 (65 Fe) MG Tabs Take 1 tablet (325 mg total) by mouth in the morning and at bedtime. What changed: when to take this   Lantus SoloStar 100 UNIT/ML Solostar Pen Generic drug: insulin glargine Inject 38 Units into the skin daily.   losartan 50 MG tablet Commonly known as: COZAAR Take 50 mg by mouth daily.   melatonin 5 MG Tabs Take 5 mg by mouth at bedtime.   metFORMIN 500 MG tablet Commonly known as: GLUCOPHAGE Take 1,000 mg by mouth 2 (two) times daily. What changed: Another medication with the same name was removed. Continue taking this medication, and follow the directions you see here.   montelukast 10 MG tablet Commonly known as: SINGULAIR Take 10 mg by mouth at bedtime.   senna-docusate 8.6-50 MG tablet Commonly known as:  Senokot-S Take 1 tablet by mouth 2 (two) times daily between meals as needed for mild constipation.   vitamin B-12 500 MCG tablet Commonly known as: CYANOCOBALAMIN Take 500 mcg by mouth daily.   VITAMIN D (CHOLECALCIFEROL) PO Take 1 tablet by mouth daily.        Consultations: None  Procedures/Studies:   DG Chest 2 View  Result Date: 02/01/2021 CLINICAL DATA:  Shortness of breath. EXAM: CHEST - 2 VIEW  COMPARISON:  November 02, 2020. FINDINGS: Stable cardiomegaly. Mild bibasilar atelectasis or edema is noted. Bony thorax is unremarkable. IMPRESSION: Mild bibasilar atelectasis or edema is noted. Electronically Signed   By: Marijo Conception M.D.   On: 02/01/2021 14:19   CT CHEST WO CONTRAST  Result Date: 02/01/2021 CLINICAL DATA:  Pneumonia. Effusion or abscess suspected. Shortness of breath over the last few days. Right-sided chest pain. EXAM: CT CHEST WITHOUT CONTRAST TECHNIQUE: Multidetector CT imaging of the chest was performed following the standard protocol without IV contrast. COMPARISON:  Chest radiography same day.  CT 10/29/2020. FINDINGS: Cardiovascular: Cardiomegaly. No pericardial effusion. Coronary artery calcification is present. Aortic atherosclerotic calcification is present. Mediastinum/Nodes: Stable mildly prominent mediastinal lymph nodes, likely reactive. Lungs/Pleura: Mild dependent atelectasis in the left lower lobe. No pleural effusion on the left. On the right, there is a small pleural effusion layering dependently, smaller than was seen in August. There is mild dependent atelectasis. Small subpleural nodule in the right middle lobe is unchanged and not significant. Upper Abdomen: No acute finding. Relative enlargement of the left lobe of the liver which could be seen with early cirrhosis. Musculoskeletal: Ordinary mild thoracic degenerative changes. IMPRESSION: Similar but improved appearance compared to the study of 10/29/2020. Small right effusion layering  dependently with dependent atelectasis, improved since August. Cardiomegaly. Aortic Atherosclerosis (ICD10-I70.0). Coronary artery calcification. Mildly prominent mediastinal lymph nodes, probably reactive. Probable cirrhosis of the liver. No significant pulmonary mass or nodule. Few small subpleural nodules not requiring further follow-up. Electronically Signed   By: Nelson Chimes M.D.   On: 02/01/2021 17:12   [place] ASCITES (ABDOMEN LIMITED)  Result Date: 02/02/2021 CLINICAL DATA:  Abdominal distention EXAM: LIMITED ABDOMEN ULTRASOUND FOR ASCITES TECHNIQUE: Limited ultrasound survey for ascites was performed in all four abdominal quadrants. COMPARISON:  CT chest 02/01/2021, CT abdomen/pelvis 10/11/2020. FINDINGS: No ascites is identified in the 4 abdominal quadrants. IMPRESSION: No ascites identified. Electronically Signed   By: Valetta Mole M.D.   On: 02/02/2021 15:27       The results of significant diagnostics from this hospitalization (including imaging, microbiology, ancillary and laboratory) are listed below for reference.     Microbiology: Recent Results (from the past 240 hour(s))  Resp Panel by RT-PCR (Flu A&B, Covid) Nasopharyngeal Swab     Status: None   Collection Time: 02/01/21 12:56 PM   Specimen: Nasopharyngeal Swab; Nasopharyngeal(NP) swabs in vial transport medium  Result Value Ref Range Status   SARS Coronavirus 2 by RT PCR NEGATIVE NEGATIVE Final    Comment: (NOTE) SARS-CoV-2 target nucleic acids are NOT DETECTED.  The SARS-CoV-2 RNA is generally detectable in upper respiratory specimens during the acute phase of infection. The lowest concentration of SARS-CoV-2 viral copies this assay can detect is 138 copies/mL. A negative result does not preclude SARS-Cov-2 infection and should not be used as the sole basis for treatment or other patient management decisions. A negative result may occur with  improper specimen collection/handling, submission of specimen other than  nasopharyngeal swab, presence of viral mutation(s) within the areas targeted by this assay, and inadequate number of viral copies(<138 copies/mL). A negative result must be combined with clinical observations, patient history, and epidemiological information. The expected result is Negative.  Fact Sheet for Patients:  EntrepreneurPulse.com.au  Fact Sheet for Healthcare Providers:  IncredibleEmployment.be  This test is no t yet approved or cleared by the [place] FDA and  has been authorized for detection and/or diagnosis of SARS-CoV-2 by FDA under an Emergency Use  Authorization (EUA). This EUA will remain  in effect (meaning this test can be used) for the duration of the COVID-19 declaration under Section 564(b)(1) of the Act, 21 U.S.C.section 360bbb-3(b)(1), unless the authorization is terminated  or revoked sooner.       Influenza A by PCR NEGATIVE NEGATIVE Final   Influenza B by PCR NEGATIVE NEGATIVE Final    Comment: (NOTE) The Xpert Xpress SARS-CoV-2/FLU/RSV plus assay is intended as an aid in the diagnosis of influenza from Nasopharyngeal swab specimens and should not be used as a sole basis for treatment. Nasal washings and aspirates are unacceptable for Xpert Xpress SARS-CoV-2/FLU/RSV testing.  Fact Sheet for Patients: EntrepreneurPulse.com.au  Fact Sheet for Healthcare Providers: IncredibleEmployment.be  This test is not yet approved or cleared by the [place] FDA and has been authorized for detection and/or diagnosis of SARS-CoV-2 by FDA under an Emergency Use Authorization (EUA). This EUA will remain in effect (meaning this test can be used) for the duration of the COVID-19 declaration under Section 564(b)(1) of the Act, 21 U.S.C. section 360bbb-3(b)(1), unless the authorization is terminated or revoked.  Performed at [facility], [address] [place], [place]  [address]   Urine Culture     Status: Abnormal   Collection Time: 02/01/21  1:42 PM   Specimen: Urine, Catheterized  Result Value Ref Range Status   Specimen Description   Final    URINE, CATHETERIZED Performed at [facility], [address] [place], [place] [address]    Special Requests   Final    NONE Performed at [facility], [address] [place], [place] [address]    Culture (A)  Final    >=100,000 COLONIES/mL ESCHERICHIA COLI 80,000 COLONIES/mL KLEBSIELLA PNEUMONIAE    Report Status 02/03/2021 FINAL  Final   Organism ID, Bacteria ESCHERICHIA COLI (A)  Final   Organism ID, Bacteria KLEBSIELLA PNEUMONIAE (A)  Final      Susceptibility   Escherichia coli - MIC*    AMPICILLIN 8 SENSITIVE Sensitive     CEFAZOLIN <=4 SENSITIVE Sensitive     CEFEPIME <=0.12 SENSITIVE Sensitive     CEFTRIAXONE <=0.25 SENSITIVE Sensitive     CIPROFLOXACIN <=0.25 SENSITIVE Sensitive     GENTAMICIN <=1 SENSITIVE Sensitive     IMIPENEM <=0.25 SENSITIVE Sensitive     NITROFURANTOIN <=16 SENSITIVE Sensitive     TRIMETH/SULFA <=20 SENSITIVE Sensitive     AMPICILLIN/SULBACTAM 4 SENSITIVE Sensitive     PIP/TAZO <=4 SENSITIVE Sensitive     * >=100,000 COLONIES/mL ESCHERICHIA COLI   Klebsiella pneumoniae - MIC*    AMPICILLIN >=32 RESISTANT Resistant     CEFAZOLIN <=4 SENSITIVE Sensitive     CEFEPIME <=0.12 SENSITIVE Sensitive     CEFTRIAXONE <=0.25 SENSITIVE Sensitive     CIPROFLOXACIN <=0.25 SENSITIVE Sensitive     GENTAMICIN <=1 SENSITIVE Sensitive     IMIPENEM <=0.25 SENSITIVE Sensitive     NITROFURANTOIN 64 INTERMEDIATE Intermediate     TRIMETH/SULFA <=20 SENSITIVE Sensitive     AMPICILLIN/SULBACTAM 4 SENSITIVE Sensitive     PIP/TAZO <=4 SENSITIVE Sensitive     * 80,000 COLONIES/mL KLEBSIELLA PNEUMONIAE  Resp Panel by RT-PCR (Flu A&B, Covid) Nasopharyngeal Swab     Status: None   Collection Time: 02/04/21  5:09 PM   Specimen: Nasopharyngeal Swab; Nasopharyngeal(NP) swabs  in vial transport medium  Result Value Ref Range Status   SARS Coronavirus 2 by RT PCR NEGATIVE NEGATIVE Final    Comment: (NOTE) SARS-CoV-2 target nucleic acids  are NOT DETECTED.  The SARS-CoV-2 RNA is generally detectable in upper respiratory specimens during the acute phase of infection. The lowest concentration of SARS-CoV-2 viral copies this assay can detect is 138 copies/mL. A negative result does not preclude SARS-Cov-2 infection and should not be used as the sole basis for treatment or other patient management decisions. A negative result may occur with  improper specimen collection/handling, submission of specimen other than nasopharyngeal swab, presence of viral mutation(s) within the areas targeted by this assay, and inadequate number of viral copies(<138 copies/mL). A negative result must be combined with clinical observations, patient history, and epidemiological information. The expected result is Negative.  Fact Sheet for Patients:  EntrepreneurPulse.com.au  Fact Sheet for Healthcare Providers:  IncredibleEmployment.be  This test is no t yet approved or cleared by the [place] FDA and  has been authorized for detection and/or diagnosis of SARS-CoV-2 by FDA under an Emergency Use Authorization (EUA). This EUA will remain  in effect (meaning this test can be used) for the duration of the COVID-19 declaration under Section 564(b)(1) of the Act, 21 U.S.C.section 360bbb-3(b)(1), unless the authorization is terminated  or revoked sooner.       Influenza A by PCR NEGATIVE NEGATIVE Final   Influenza B by PCR NEGATIVE NEGATIVE Final    Comment: (NOTE) The Xpert Xpress SARS-CoV-2/FLU/RSV plus assay is intended as an aid in the diagnosis of influenza from Nasopharyngeal swab specimens and should not be used as a sole basis for treatment. Nasal washings and aspirates are unacceptable for Xpert Xpress  SARS-CoV-2/FLU/RSV testing.  Fact Sheet for Patients: EntrepreneurPulse.com.au  Fact Sheet for Healthcare Providers: IncredibleEmployment.be  This test is not yet approved or cleared by the [place] FDA and has been authorized for detection and/or diagnosis of SARS-CoV-2 by FDA under an Emergency Use Authorization (EUA). This EUA will remain in effect (meaning this test can be used) for the duration of the COVID-19 declaration under Section 564(b)(1) of the Act, 21 U.S.C. section 360bbb-3(b)(1), unless the authorization is terminated or revoked.  Performed at [facility], [address] [place], [place] [address]      Labs:  CBC: Recent Labs  Lab 02/01/21 1627 02/02/21 0616 02/03/21 0414 02/04/21 0612 02/05/21 0531  WBC 11.3* 11.2* 9.6 9.7 9.8  HGB 9.1* 8.6* 8.7* 8.8* 9.2*  HCT 29.6* 28.0* 29.1* 29.5* 29.8*  MCV 82.0 80.7 82.2 82.6 83.5  PLT 306 284 295 285 267   BMP &GFR Recent Labs  Lab 02/01/21 1256 02/02/21 0616 02/03/21 0414 02/04/21 0612 02/05/21 0531  NA 128* 130* 132* 135 136  K 5.2* 4.8 4.5 4.1 3.8  CL 97* 99 98 99 94*  CO2 24 27 30  32 33*  GLUCOSE 172* 145* 194* 140* 155*  BUN 48* 44* 42* 26* 18  CREATININE 1.28* 1.21* 1.14* 0.82 0.66  CALCIUM 8.9 8.6* 8.5* 8.6* 8.6*  MG  --   --  2.0 1.7 2.0  PHOS  --   --  4.1 3.2 3.8   Estimated Creatinine Clearance: 68.6 mL/min (by C-G formula based on SCr of 0.66 mg/dL). Liver & Pancreas: Recent Labs  Lab 02/03/21 0414 02/04/21 0612 02/05/21 0531  ALBUMIN 3.0* 3.1* 3.0*   No results for input(s): LIPASE, AMYLASE in the last 168 hours. Recent Labs  Lab 02/02/21 1154 02/03/21 0414  AMMONIA 36* 30   Diabetic: Recent Labs    02/03/21 0414  HGBA1C 9.8*   Recent Labs  Lab 02/04/21 0728 02/04/21 1146 02/04/21 1636  02/04/21 2006 02/05/21 0822  GLUCAP 149* 204* 207* 115* 148*   Cardiac Enzymes: Recent Labs  Lab 02/03/21 0414  CKTOTAL  83   No results for input(s): PROBNP in the last 8760 hours. Coagulation Profile: No results for input(s): INR, PROTIME in the last 168 hours. Thyroid Function Tests: Recent Labs    02/02/21 1154  TSH 3.489   Lipid Profile: No results for input(s): CHOL, HDL, LDLCALC, TRIG, CHOLHDL, LDLDIRECT in the last 72 hours. Anemia Panel: Recent Labs    02/02/21 1154 02/04/21 0612  VITAMINB12 479 613  FOLATE  --  12.7  FERRITIN  --  9*  TIBC  --  456*  IRON  --  22*  RETICCTPCT  --  1.7   Urine analysis:    Component Value Date/Time   COLORURINE YELLOW 02/02/2021 1200   APPEARANCEUR HAZY (A) 02/02/2021 1200   APPEARANCEUR Cloudy (A) 09/28/2017 1338   LABSPEC 1.015 02/02/2021 1200   LABSPEC 1.024 04/02/2014 0517   PHURINE 5.5 02/02/2021 1200   GLUCOSEU NEGATIVE 02/02/2021 1200   GLUCOSEU >=500 04/02/2014 0517   HGBUR TRACE (A) 02/02/2021 1200   BILIRUBINUR NEGATIVE 02/02/2021 1200   BILIRUBINUR Negative 09/28/2017 1338   BILIRUBINUR Negative 04/02/2014 [place] 02/02/2021 1200   PROTEINUR NEGATIVE 02/02/2021 1200   NITRITE NEGATIVE 02/02/2021 1200   LEUKOCYTESUR SMALL (A) 02/02/2021 1200   LEUKOCYTESUR Trace 04/02/2014 0517   Sepsis Labs: Invalid input(s): PROCALCITONIN, LACTICIDVEN   Time coordinating discharge: 55 minutes  SIGNED:  Mercy Riding, MD  [facility] 02/05/2021, 9:55 AM

## 2021-02-05 NOTE — TOC Progression Note (Addendum)
Transition of Care [facility]) - Progression Note    Patient Details  Name: Karen [name] MRN: [id number] Date of Birth: [birth year]  Transition of Care [facility] CM/SW Contact  Izola Price, RN Phone Number: 02/05/2021, 2:59 PM  Clinical Narrative: Patient was readied for discharge to [facility] today. Confirmed with Supervisor and faxed DC Summary. DC now held per provider. RN CM let facility know. Simmie Davies RN CM       Expected Discharge Plan: Skilled Nursing Facility Barriers to Discharge: Continued Medical Work up  Expected Discharge Plan and Services Expected Discharge Plan: [place] Choice: [place] arrangements for the past 2 months: Single Family Home Expected Discharge Date: 02/05/21                                     Social Determinants of Health (SDOH) Interventions    Readmission Risk Interventions Readmission Risk Prevention Plan 10/30/2020 06/21/2020 09/30/2019  Transportation Screening Complete Complete Complete  PCP or Specialist Appt within 5-7 Days - - -  PCP or Specialist Appt within 3-5 Days - - Complete  Home Care Screening - - -  Medication Review (RN CM) - - -  Social Work Scientific laboratory technician for [place] Planning/Counseling - - Complete  Palliative Care Screening - - Not Applicable  Medication Review Press photographer) Complete Complete Complete  PCP or Specialist appointment within 3-5 days of discharge Complete Complete -  PCP/Specialist Appt Not Complete comments - - -  HRI or Home Care Consult Patient refused Complete -  SW Recovery Care/Counseling Consult Complete Complete -  Palliative Care Screening Not Applicable Not Applicable -  [place] Not Applicable Not Applicable -  Some recent data might be hidden

## 2021-02-05 NOTE — Progress Notes (Addendum)
Secure chat sent to J. Mansy MD for patient's complaints of itching all over. Patient appear to have starting itching after 1 dose of feraheme was given via IV tonight. Requested for benadryl. Awaiting orders. Patient noted scratch marks and bleeding to left outer buttock. Cleansed and lotion applied. Bed pad changed.   0223- New order noted and transcribed

## 2021-02-06 ENCOUNTER — Inpatient Hospital Stay (HOSPITAL_COMMUNITY)
Admit: 2021-02-06 | Discharge: 2021-02-06 | Disposition: A | Discharge status: Home / Self Care | Payer: Medicare Other | Attending: Student | Admitting: Student

## 2021-02-06 DIAGNOSIS — I5031 Acute diastolic (congestive) heart failure: Secondary | ICD-10-CM | POA: Diagnosis not present

## 2021-02-06 DIAGNOSIS — I480 Paroxysmal atrial fibrillation: Secondary | ICD-10-CM | POA: Diagnosis not present

## 2021-02-06 DIAGNOSIS — E1169 Type 2 diabetes mellitus with other specified complication: Secondary | ICD-10-CM

## 2021-02-06 DIAGNOSIS — N1831 Chronic kidney disease, stage 3a: Secondary | ICD-10-CM | POA: Diagnosis not present

## 2021-02-06 DIAGNOSIS — E785 Hyperlipidemia, unspecified: Secondary | ICD-10-CM

## 2021-02-06 DIAGNOSIS — J432 Centrilobular emphysema: Secondary | ICD-10-CM | POA: Diagnosis not present

## 2021-02-06 DIAGNOSIS — J90 Pleural effusion, not elsewhere classified: Secondary | ICD-10-CM

## 2021-02-06 DIAGNOSIS — N189 Chronic kidney disease, unspecified: Secondary | ICD-10-CM

## 2021-02-06 DIAGNOSIS — N390 Urinary tract infection, site not specified: Secondary | ICD-10-CM

## 2021-02-06 DIAGNOSIS — G9341 Metabolic encephalopathy: Secondary | ICD-10-CM

## 2021-02-06 DIAGNOSIS — N179 Acute kidney failure, unspecified: Secondary | ICD-10-CM

## 2021-02-06 LAB — GLUCOSE, CAPILLARY
Glucose-Capillary: 148 mg/dL — ABNORMAL HIGH (ref 70–99)
Glucose-Capillary: 209 mg/dL — ABNORMAL HIGH (ref 70–99)
Glucose-Capillary: 209 mg/dL — ABNORMAL HIGH (ref 70–99)
Glucose-Capillary: 115 mg/dL — ABNORMAL HIGH (ref 70–99)

## 2021-02-06 LAB — CBC WITH DIFFERENTIAL/PLATELET
Abs Immature Granulocytes: 0.03 K/uL (ref 0.00–0.07)
Basophils Absolute: 0 K/uL (ref 0.0–0.1)
Basophils Relative: 0 %
Eosinophils Absolute: 0.3 K/uL (ref 0.0–0.5)
Eosinophils Relative: 3 %
HCT: 30 % — ABNORMAL LOW (ref 36.0–46.0)
Hemoglobin: 9.2 g/dL — ABNORMAL LOW (ref 12.0–15.0)
Immature Granulocytes: 0 %
Lymphocytes Relative: 11 %
Lymphs Abs: 1 K/uL (ref 0.7–4.0)
MCH: 25.3 pg — ABNORMAL LOW (ref 26.0–34.0)
MCHC: 30.7 g/dL (ref 30.0–36.0)
MCV: 82.6 fL (ref 80.0–100.0)
Monocytes Absolute: 0.6 K/uL (ref 0.1–1.0)
Monocytes Relative: 6 %
Neutro Abs: 7.3 K/uL (ref 1.7–7.7)
Neutrophils Relative %: 80 %
Platelets: 280 K/uL (ref 150–400)
RBC: 3.63 MIL/uL — ABNORMAL LOW (ref 3.87–5.11)
RDW: 17.6 % — ABNORMAL HIGH (ref 11.5–15.5)
WBC: 9.2 K/uL (ref 4.0–10.5)
nRBC: 0 % (ref 0.0–0.2)

## 2021-02-06 LAB — COMPREHENSIVE METABOLIC PANEL WITH GFR
ALT: 9 U/L (ref 0–44)
AST: 12 U/L — ABNORMAL LOW (ref 15–41)
Albumin: 3 g/dL — ABNORMAL LOW (ref 3.5–5.0)
Alkaline Phosphatase: 75 U/L (ref 38–126)
Anion gap: 6 (ref 5–15)
BUN: 14 mg/dL (ref 8–23)
CO2: 38 mmol/L — ABNORMAL HIGH (ref 22–32)
Calcium: 8.9 mg/dL (ref 8.9–10.3)
Chloride: 92 mmol/L — ABNORMAL LOW (ref 98–111)
Creatinine, Ser: 0.7 mg/dL (ref 0.44–1.00)
GFR, Estimated: >60 mL/min
Glucose, Bld: 202 mg/dL — ABNORMAL HIGH (ref 70–99)
Potassium: 3.8 mmol/L (ref 3.5–5.1)
Sodium: 136 mmol/L (ref 135–145)
Total Bilirubin: 0.8 mg/dL (ref 0.3–1.2)
Total Protein: 6.1 g/dL — ABNORMAL LOW (ref 6.5–8.1)

## 2021-02-06 LAB — LACTATE DEHYDROGENASE: LDH: 109 U/L (ref 98–192)

## 2021-02-06 LAB — ECHOCARDIOGRAM COMPLETE
AR max vel: 0.75 cm2
AV Area VTI: 0.86 cm2
AV Area mean vel: 0.75 cm2
AV Mean grad: 17 mmHg
AV Peak grad: 29.4 mmHg
Ao pk vel: 2.71 m/s
Area-P 1/2: 3.17 cm2
Height: 63 in
S' Lateral: 2.9 cm
Weight: 3640.24 oz

## 2021-02-06 LAB — MAGNESIUM: Magnesium: 1.8 mg/dL (ref 1.7–2.4)

## 2021-02-06 LAB — BRAIN NATRIURETIC PEPTIDE: B Natriuretic Peptide: 236 pg/mL — ABNORMAL HIGH (ref 0.0–100.0)

## 2021-02-06 MED ORDER — MAGNESIUM SULFATE 2 GM/50ML IV SOLN
2.0000 g | Freq: Once | INTRAVENOUS | Status: AC
  Administered 2021-02-06: 17:00:00 2 g via INTRAVENOUS
  Filled 2021-02-06: qty 50

## 2021-02-06 MED ORDER — PERFLUTREN LIPID MICROSPHERE
1.0000 mL | INTRAVENOUS | Status: AC | PRN
Start: 2021-02-06 — End: 2021-02-06
  Administered 2021-02-06: 12:00:00 3 mL via INTRAVENOUS
  Filled 2021-02-06: qty 10

## 2021-02-06 NOTE — Progress Notes (Signed)
PROGRESS NOTE    Karen [name]  GEX:[id number] DOB: 22-Sep-1945 DOA: 02/01/2021 PCP: Earlie Counts, FNP    Chief Complaint  Patient presents with   Shortness of Breath    Brief Narrative:  75 year old F with PMH of COPD, chronic hypoxic RF on 3 L, diastolic CHF, moderate aortic stenosis, A. fib, DM-2 and morbid obesity presenting with decreased urine output, SOB, DOE, orthopnea, edema, chest pressure, abdominal pain, distention and diarrhea, and admitted with working diagnosis of acute diastolic CHF.  BNP elevated to 935.  CXR with cardiomegaly.  Foley catheter placed in ED.  Patient received IV Lasix in ER.  CT chest ordered on admission, and showed improved pleural effusion, cardiomegaly and probable liver cirrhosis.    Patient also had encephalopathy the next day.  UA concerning for UTI.   Urine culture with E. coli and Klebsiella pneumonia.  Antibiotics de-escalated to p.o. Keflex.  Patient was improving clinically was to be discharged however on day of discharge and discharge was canceled.  Was noted to desat to 6 L 2D echo obtained, CT chest obtained with increasing moderate right-sided pleural effusion, ultrasound-guided thoracentesis ordered.  Cardiology consulted due to abnormal 2D echo to be seen on 02/07/2021.     Assessment & Plan:   Principal Problem:   Acute diastolic CHF (congestive heart failure) (HCC) Active Problems:   COPD (chronic obstructive pulmonary disease) (HCC)   Hypertension   CKD (chronic kidney disease), stage IIIa   Morbid obesity (HCC)   Aortic stenosis, moderate   AF (paroxysmal atrial fibrillation) (HCC)   Pleural effusion   Acute lower UTI   Acute metabolic encephalopathy   Acute kidney injury superimposed on CKD (HCC)  #1 acute hypoxic respiratory distress/acute on chronic diastolic CHF/moderate pleural effusion -Likely multifactorial secondary to acute on chronic diastolic CHF, increasing moderate right-sided pleural effusion, abnormal 2D  echo with mild to moderate MVR, moderate to severe TVR, severely elevated pulmonary artery systolic pressure. -Patient initially was to be discharged 02/05/2021 after diuresis with IV Lasix, however patient noted to desat requiring 6 L nasal cannula and as such discharge canceled. -2D echo obtained with EF of 60 to 65%, NWMA, grade 1 diastolic dysfunction, severely elevated pulmonary arterial systolic pressure, mild to moderate MVR, moderate to severe TVR, mild aortic valvular stenosis. -CT angiogram chest done negative for PE, moderate right, small left pleural effusion associated atelectasis or consolidation slightly increased compared to prior examination, cardiomegaly and CAD. -Patient placed back on IV Lasix with good diuresis with urine output of 3.3 L over the past 24 hours.  While assessing patient patient noted to have a urine output of 1.2 L already this morning. -We will order ultrasound-guided diagnostic and therapeutic thoracentesis. -We will consult with cardiology for further evaluation of abnormal 2D echo. -Strict I's and O's, daily weights.  2.  Acute on chronic diastolic CHF -Concern likely secondary to severely elevated pulmonary arterial systolic pressure, mild to moderate MVR, moderate to severe TVR noted on 2D echo. -Patient on IV Lasix with urine output of 3.3 L over the past 24 hours. -Patient is -7.820 L during his hospitalization. -Current weight of 227.52 pounds from 260 pounds on admission. -2D echo obtained with EF of 60 to 65%, NWMA, grade 1 diastolic dysfunction, severely elevated pulmonary arterial systolic pressure, mild to moderate MVR, moderate to severe TVR, mild aortic valvular stenosis. -Continue Lasix 40 mg IV daily, statin. -Due to abnormality noted on 2D echo we will consult with cardiology for further evaluation  and management.  3.  Acute metabolic encephalopathy -??  Etiology. -May be secondary to UTI. -TSH, ammonia level, vitamin B12, RPR  unremarkable. -Encephalopathy improved and patient likely at baseline. -Continue antibiotics.  4.  E. coli UTI/Klebsiella UTI -Was on IV Rocephin and transition to oral Keflex to complete course of antibiotic treatment.  5.  AKI on CKD stage IIIA -Likely secondary to volume overload/acute diastolic CHF exacerbation. -Urine output of 3.3 L over the past 24 hours. -Urinalysis done on admission concerning for UTI, negative proteinuria. -Renal function improving with diuresis. -Continue IV Lasix.  6.  Chronic hypoxic respiratory failure/chronic COPD -Patient with no wheezing noted on examination.  Baseline O2 3 L nasal cannula. -Continue Breo Ellipta, Flonase, Incruse, Singulair, PPI, nebs as needed.  7.  Normocytic anemia -H&H stable.  8.  Uncontrolled insulin-dependent diabetes type 2 with hyperglycemia and other complications -Hemoglobin A1c 9.8 (02/03/2021) -CBG 148 this morning. -Continue Semglee 30 units daily, new coverage NovoLog 4 units 3 times daily, SSI. -Outpatient follow-up.  9.  Paroxysmal atrial fibrillation -In sinus rhythm. -Not on rate or rhythm control medication likely due to history of bradycardia. -Eliquis for anticoagulation.  10.  Hypertension -Blood pressure noted to be soft and as such ARB and HCTZ on hold. -On IV Lasix.  11.  Chronic back pain -Home regimen on hold due to encephalopathy.  Could likely reintroduce slowly.  12.  Hypervolemic hyponatremia -In the setting of ARB and HCTZ. -HCTZ on hold and likely will not resume on discharge. -Continue to hold ARB. -Improved with diuresis.  8.  Morbid obesity -Outpatient follow-up with PCP   DVT prophylaxis: Eliquis Code Status: DNR Family Communication: Updated patient.  No family at bedside. Disposition:   Status is: Inpatient  Remains inpatient appropriate because: Severity of illness       Consultants:  None  Procedures:  CT chest 02/01/2021, CT angiogram chest 02/05/2021 Chest  x-ray 02/02/2019 Abdominal ultrasound 02/02/2021 2D echo 02/06/2021  Antimicrobials:  Keflex 02/03/2021>>>> 02/08/2021 IV Rocephin 02/02/2021>>>> 02/03/2021   Subjective: Patient states overall breathing feels better.  No chest pain.  No abdominal pain.  Denies any bleeding.  Tolerating current diet.  Pure wick canister full at 1200 cc.  Sats of 94% on 4 L nasal cannula.  Objective: Vitals:   02/06/21 0500 02/06/21 0750 02/06/21 1148 02/06/21 1610  BP:  134/61 (!) 147/64 138/68  Pulse:  88 83 92  Resp: 20 18 18 18   Temp:  97.8 F (36.6 C) 98.5 F (36.9 C) 98.2 F (36.8 C)  TempSrc:      SpO2:  91% 94% 94%  Weight: 103.2 kg     Height:        Intake/Output Summary (Last 24 hours) at 02/06/2021 1630 Last data filed at 02/06/2021 1350 Gross per 24 hour  Intake 960 ml  Output 1800 ml  Net -840 ml   Filed Weights   02/03/21 0338 02/05/21 0444 02/06/21 0500  Weight: 105.1 kg 102.8 kg 103.2 kg    Examination:  General exam: NAD Respiratory system: Decreased breath sounds in the right base.  Some scattered crackles.  No wheezing.  Fair air movement.  Speaking in full sentences.  Cardiovascular system: S1 & S2 heard, RRR. No JVD, murmurs, rubs, gallops or clicks. No pedal edema. Gastrointestinal system: Abdomen is nondistended, soft and nontender. No organomegaly or masses felt. Normal bowel sounds heard. Central nervous system: Alert and oriented. No focal neurological deficits. Extremities: Symmetric 5 x 5 power. Skin: No rashes,  lesions or ulcers Psychiatry: Judgement and insight appear normal. Mood & affect appropriate.     Data Reviewed: I have personally reviewed following labs and imaging studies  CBC: Recent Labs  Lab 02/02/21 0616 02/03/21 0414 02/04/21 0612 02/05/21 0531 02/06/21 1057  WBC 11.2* 9.6 9.7 9.8 9.2  NEUTROABS  --   --   --   --  7.3  HGB 8.6* 8.7* 8.8* 9.2* 9.2*  HCT 28.0* 29.1* 29.5* 29.8* 30.0*  MCV 80.7 82.2 82.6 83.5 82.6  PLT 284 295  285 267 400    Basic Metabolic Panel: Recent Labs  Lab 02/02/21 0616 02/03/21 0414 02/04/21 0612 02/05/21 0531 02/06/21 1057  NA 130* 132* 135 136 136  K 4.8 4.5 4.1 3.8 3.8  CL 99 98 99 94* 92*  CO2 27 30 32 33* 38*  GLUCOSE 145* 194* 140* 155* 202*  BUN 44* 42* 26* 18 14  CREATININE 1.21* 1.14* 0.82 0.66 0.70  CALCIUM 8.6* 8.5* 8.6* 8.6* 8.9  MG  --  2.0 1.7 2.0 1.8  PHOS  --  4.1 3.2 3.8  --     GFR: Estimated Creatinine Clearance: 68.7 mL/min (by C-G formula based on SCr of 0.7 mg/dL).  Liver Function Tests: Recent Labs  Lab 02/03/21 0414 02/04/21 0612 02/05/21 0531 02/06/21 1057  AST  --   --   --  12*  ALT  --   --   --  9  ALKPHOS  --   --   --  75  BILITOT  --   --   --  0.8  PROT  --   --   --  6.1*  ALBUMIN 3.0* 3.1* 3.0* 3.0*    CBG: Recent Labs  Lab 02/05/21 1627 02/05/21 2141 02/06/21 0727 02/06/21 1148 02/06/21 1610  GLUCAP 166* 185* 148* 209* 209*     Recent Results (from the past 240 hour(s))  Resp Panel by RT-PCR (Flu A&B, Covid) Nasopharyngeal Swab     Status: None   Collection Time: 02/01/21 12:56 PM   Specimen: Nasopharyngeal Swab; Nasopharyngeal(NP) swabs in vial transport medium  Result Value Ref Range Status   SARS Coronavirus 2 by RT PCR NEGATIVE NEGATIVE Final    Comment: (NOTE) SARS-CoV-2 target nucleic acids are NOT DETECTED.  The SARS-CoV-2 RNA is generally detectable in upper respiratory specimens during the acute phase of infection. The lowest concentration of SARS-CoV-2 viral copies this assay can detect is 138 copies/mL. A negative result does not preclude SARS-Cov-2 infection and should not be used as the sole basis for treatment or other patient management decisions. A negative result may occur with  improper specimen collection/handling, submission of specimen other than nasopharyngeal swab, presence of viral mutation(s) within the areas targeted by this assay, and inadequate number of viral copies(<138  copies/mL). A negative result must be combined with clinical observations, patient history, and epidemiological information. The expected result is Negative.  Fact Sheet for Patients:  EntrepreneurPulse.com.au  Fact Sheet for Healthcare Providers:  IncredibleEmployment.be  This test is no t yet approved or cleared by the [place] FDA and  has been authorized for detection and/or diagnosis of SARS-CoV-2 by FDA under an Emergency Use Authorization (EUA). This EUA will remain  in effect (meaning this test can be used) for the duration of the COVID-19 declaration under Section 564(b)(1) of the Act, 21 U.S.C.section 360bbb-3(b)(1), unless the authorization is terminated  or revoked sooner.       Influenza A by PCR NEGATIVE NEGATIVE Final  Influenza B by PCR NEGATIVE NEGATIVE Final    Comment: (NOTE) The Xpert Xpress SARS-CoV-2/FLU/RSV plus assay is intended as an aid in the diagnosis of influenza from Nasopharyngeal swab specimens and should not be used as a sole basis for treatment. Nasal washings and aspirates are unacceptable for Xpert Xpress SARS-CoV-2/FLU/RSV testing.  Fact Sheet for Patients: EntrepreneurPulse.com.au  Fact Sheet for Healthcare Providers: IncredibleEmployment.be  This test is not yet approved or cleared by the [place] FDA and has been authorized for detection and/or diagnosis of SARS-CoV-2 by FDA under an Emergency Use Authorization (EUA). This EUA will remain in effect (meaning this test can be used) for the duration of the COVID-19 declaration under Section 564(b)(1) of the Act, 21 U.S.C. section 360bbb-3(b)(1), unless the authorization is terminated or revoked.  Performed at [facility], [address] [place], [place] [address]   Urine Culture     Status: Abnormal   Collection Time: 02/01/21  1:42 PM   Specimen: Urine, Catheterized  Result Value Ref Range  Status   Specimen Description   Final    URINE, CATHETERIZED Performed at [facility], [address] [place], [place] [address]    Special Requests   Final    NONE Performed at [facility], [address] [place], [place] [address]    Culture (A)  Final    >=100,000 COLONIES/mL ESCHERICHIA COLI 80,000 COLONIES/mL KLEBSIELLA PNEUMONIAE    Report Status 02/03/2021 FINAL  Final   Organism ID, Bacteria ESCHERICHIA COLI (A)  Final   Organism ID, Bacteria KLEBSIELLA PNEUMONIAE (A)  Final      Susceptibility   Escherichia coli - MIC*    AMPICILLIN 8 SENSITIVE Sensitive     CEFAZOLIN <=4 SENSITIVE Sensitive     CEFEPIME <=0.12 SENSITIVE Sensitive     CEFTRIAXONE <=0.25 SENSITIVE Sensitive     CIPROFLOXACIN <=0.25 SENSITIVE Sensitive     GENTAMICIN <=1 SENSITIVE Sensitive     IMIPENEM <=0.25 SENSITIVE Sensitive     NITROFURANTOIN <=16 SENSITIVE Sensitive     TRIMETH/SULFA <=20 SENSITIVE Sensitive     AMPICILLIN/SULBACTAM 4 SENSITIVE Sensitive     PIP/TAZO <=4 SENSITIVE Sensitive     * >=100,000 COLONIES/mL ESCHERICHIA COLI   Klebsiella pneumoniae - MIC*    AMPICILLIN >=32 RESISTANT Resistant     CEFAZOLIN <=4 SENSITIVE Sensitive     CEFEPIME <=0.12 SENSITIVE Sensitive     CEFTRIAXONE <=0.25 SENSITIVE Sensitive     CIPROFLOXACIN <=0.25 SENSITIVE Sensitive     GENTAMICIN <=1 SENSITIVE Sensitive     IMIPENEM <=0.25 SENSITIVE Sensitive     NITROFURANTOIN 64 INTERMEDIATE Intermediate     TRIMETH/SULFA <=20 SENSITIVE Sensitive     AMPICILLIN/SULBACTAM 4 SENSITIVE Sensitive     PIP/TAZO <=4 SENSITIVE Sensitive     * 80,000 COLONIES/mL KLEBSIELLA PNEUMONIAE  Resp Panel by RT-PCR (Flu A&B, Covid) Nasopharyngeal Swab     Status: None   Collection Time: 02/04/21  5:09 PM   Specimen: Nasopharyngeal Swab; Nasopharyngeal(NP) swabs in vial transport medium  Result Value Ref Range Status   SARS Coronavirus 2 by RT PCR NEGATIVE NEGATIVE Final    Comment:  (NOTE) SARS-CoV-2 target nucleic acids are NOT DETECTED.  The SARS-CoV-2 RNA is generally detectable in upper respiratory specimens during the acute phase of infection. The lowest concentration of SARS-CoV-2 viral copies this assay can detect is 138 copies/mL. A negative result does not preclude SARS-Cov-2 infection and should not be used as the sole basis for treatment or other  patient management decisions. A negative result may occur with  improper specimen collection/handling, submission of specimen other than nasopharyngeal swab, presence of viral mutation(s) within the areas targeted by this assay, and inadequate number of viral copies(<138 copies/mL). A negative result must be combined with clinical observations, patient history, and epidemiological information. The expected result is Negative.  Fact Sheet for Patients:  EntrepreneurPulse.com.au  Fact Sheet for Healthcare Providers:  IncredibleEmployment.be  This test is no t yet approved or cleared by the [place] FDA and  has been authorized for detection and/or diagnosis of SARS-CoV-2 by FDA under an Emergency Use Authorization (EUA). This EUA will remain  in effect (meaning this test can be used) for the duration of the COVID-19 declaration under Section 564(b)(1) of the Act, 21 U.S.C.section 360bbb-3(b)(1), unless the authorization is terminated  or revoked sooner.       Influenza A by PCR NEGATIVE NEGATIVE Final   Influenza B by PCR NEGATIVE NEGATIVE Final    Comment: (NOTE) The Xpert Xpress SARS-CoV-2/FLU/RSV plus assay is intended as an aid in the diagnosis of influenza from Nasopharyngeal swab specimens and should not be used as a sole basis for treatment. Nasal washings and aspirates are unacceptable for Xpert Xpress SARS-CoV-2/FLU/RSV testing.  Fact Sheet for Patients: EntrepreneurPulse.com.au  Fact Sheet for Healthcare  Providers: IncredibleEmployment.be  This test is not yet approved or cleared by the [place] FDA and has been authorized for detection and/or diagnosis of SARS-CoV-2 by FDA under an Emergency Use Authorization (EUA). This EUA will remain in effect (meaning this test can be used) for the duration of the COVID-19 declaration under Section 564(b)(1) of the Act, 21 U.S.C. section 360bbb-3(b)(1), unless the authorization is terminated or revoked.  Performed at [facility], [address], [place], [place] [address]          Radiology Studies: CT Angio Chest Pulmonary Embolism (PE) W or WO Contrast  Result Date: 02/05/2021 CLINICAL DATA:  PE suspected, shortness of breath EXAM: CT ANGIOGRAPHY CHEST WITH CONTRAST TECHNIQUE: Multidetector CT imaging of the chest was performed using the standard protocol during bolus administration of intravenous contrast. Multiplanar CT image reconstructions and MIPs were obtained to evaluate the vascular anatomy. CONTRAST:  25mL OMNIPAQUE IOHEXOL 350 MG/ML SOLN COMPARISON:  02/01/2021 FINDINGS: Cardiovascular: Satisfactory opacification of the pulmonary arteries to the segmental level. No evidence of pulmonary embolism. Cardiomegaly. Three-vessel coronary artery calcifications no pericardial effusion. Aortic atherosclerosis. Mediastinum/Nodes: Unchanged prominent mediastinal and hilar lymph nodes. Thyroid gland, trachea, and esophagus demonstrate no significant findings. Lungs/Pleura: Moderate right, small left pleural effusions and associated atelectasis or consolidation, slightly increased compared to prior examination. Upper Abdomen: No acute abnormality. Musculoskeletal: No chest wall abnormality. No acute or significant osseous findings. Review of the MIP images confirms the above findings. IMPRESSION: 1. Negative examination for pulmonary embolism. 2. Moderate right, small left pleural effusions and associated atelectasis or  consolidation, slightly increased compared to prior examination. 3. Cardiomegaly and coronary artery disease. Aortic Atherosclerosis (ICD10-I70.0). Electronically Signed   By: Delanna Ahmadi M.D.   On: 02/05/2021 17:26   ECHOCARDIOGRAM COMPLETE  Result Date: 02/06/2021    ECHOCARDIOGRAM REPORT   Patient Name:   RECIE CIRRINCIONE Date of Exam: 02/06/2021 Medical Rec #:  [id number]        Height:       63.0 in Accession #:    [id number]       Weight:       227.5 lb Date of Birth:  1945-04-03  BSA:          2.043 m Patient Age:    54 years         BP:           138/56 mmHg Patient Gender: F                HR:           73 bpm. Exam Location:  [facility] Procedure: 2D Echo and Intracardiac Opacification Agent Indications:     Diastolic CHF  History:         Patient has prior history of Echocardiogram examinations. CHF,                  COPD, Aortic Valve Disease, Arrythmias:Atrial Fibrillation;                  Risk Factors:Morbid Obesity.  Sonographer:     L Thornton-Maynard Referring Phys:  [id number] [facility] Diagnosing Phys: Ida Rogue MD  Sonographer Comments: Image acquisition challenging due to COPD and Image acquisition challenging due to patient body habitus. IMPRESSIONS  1. Left ventricular ejection fraction, by estimation, is 60 to 65%. The left ventricle has normal function. The left ventricle has no regional wall motion abnormalities. Left ventricular diastolic parameters are consistent with Grade I diastolic dysfunction (impaired relaxation).  2. Right ventricular systolic function is mildly reduced. The right ventricular size is mildly enlarged. There is severely elevated pulmonary artery systolic pressure. The estimated right ventricular systolic pressure is 50.9 mmHg.  3. The mitral valve is normal in structure. Mild to moderate mitral valve regurgitation. No evidence of mitral stenosis.  4. Tricuspid valve regurgitation is moderate to severe.  5. The aortic valve is normal in structure. Aortic  valve regurgitation is not visualized. Mild aortic valve stenosis. Aortic valve mean gradient measures 17.0 mmHg. Aortic valve Vmax measures 2.71 m/s.  6. The inferior vena cava is dilated in size with >50% respiratory variability, suggesting right atrial pressure of 8 mmHg. FINDINGS  Left Ventricle: Left ventricular ejection fraction, by estimation, is 60 to 65%. The left ventricle has normal function. The left ventricle has no regional wall motion abnormalities. Definity contrast agent was given IV to delineate the left ventricular  endocardial borders. The left ventricular internal cavity size was normal in size. There is no left ventricular hypertrophy. Left ventricular diastolic parameters are consistent with Grade I diastolic dysfunction (impaired relaxation). Right Ventricle: The right ventricular size is mildly enlarged. No increase in right ventricular wall thickness. Right ventricular systolic function is mildly reduced. There is severely elevated pulmonary artery systolic pressure. The tricuspid regurgitant velocity is 3.68 m/s, and with an assumed right atrial pressure of 10 mmHg, the estimated right ventricular systolic pressure is 32.6 mmHg. Left Atrium: Left atrial size was normal in size. Right Atrium: Right atrial size was normal in size. Pericardium: There is no evidence of pericardial effusion. Mitral Valve: The mitral valve is normal in structure. Mild to moderate mitral valve regurgitation. No evidence of mitral valve stenosis. Tricuspid Valve: The tricuspid valve is normal in structure. Tricuspid valve regurgitation is moderate to severe. No evidence of tricuspid stenosis. Aortic Valve: The aortic valve is normal in structure. Aortic valve regurgitation is not visualized. Mild aortic stenosis is present. Aortic valve mean gradient measures 17.0 mmHg. Aortic valve peak gradient measures 29.4 mmHg. Aortic valve area, by VTI measures 0.86 cm. Pulmonic Valve: The pulmonic valve was normal in  structure. Pulmonic valve regurgitation is not visualized.  No evidence of pulmonic stenosis. Aorta: The aortic root is normal in size and structure. Venous: The inferior vena cava is dilated in size with greater than 50% respiratory variability, suggesting right atrial pressure of 8 mmHg. IAS/Shunts: No atrial level shunt detected by color flow Doppler.  LEFT VENTRICLE PLAX 2D LVIDd:         4.50 cm   Diastology LVIDs:         2.90 cm   LV e' medial:    5.00 cm/s LV PW:         1.00 cm   LV E/e' medial:  17.3 LV IVS:        1.00 cm   LV e' lateral:   8.59 cm/s LVOT diam:     1.80 cm   LV E/e' lateral: 10.1 LV SV:         46 LV SV Index:   23 LVOT Area:     2.54 cm  RIGHT VENTRICLE RV S prime:     13.10 cm/s TAPSE (M-mode): 2.3 cm LEFT ATRIUM             Index        RIGHT ATRIUM           Index LA diam:        3.70 cm 1.81 cm/m   RA Area:     20.70 cm LA Vol (A2C):   53.9 ml 26.39 ml/m  RA Volume:   65.70 ml  32.17 ml/m LA Vol (A4C):   62.6 ml 30.65 ml/m LA Biplane Vol: 59.0 ml 28.89 ml/m  AORTIC VALVE                     PULMONIC VALVE AV Area (Vmax):    0.75 cm      PV Vmax:       1.15 m/s AV Area (Vmean):   0.75 cm      PV Peak grad:  5.3 mmHg AV Area (VTI):     0.86 cm AV Vmax:           271.00 cm/s AV Vmean:          197.000 cm/s AV VTI:            0.533 m AV Peak Grad:      29.4 mmHg AV Mean Grad:      17.0 mmHg LVOT Vmax:         79.60 cm/s LVOT Vmean:        58.400 cm/s LVOT VTI:          0.181 m LVOT/AV VTI ratio: 0.34  AORTA Ao Root diam: 2.90 cm Ao Asc diam:  2.90 cm MITRAL VALVE                TRICUSPID VALVE MV Area (PHT): 3.17 cm     TR Peak grad:   54.2 mmHg MV Decel Time: 239 msec     TR Vmax:        368.00 cm/s MV E velocity: 86.50 cm/s MV A velocity: 116.00 cm/s  SHUNTS MV E/A ratio:  0.75         Systemic VTI:  0.18 m                             Systemic Diam: 1.80 cm Ida Rogue MD Electronically signed by Ida Rogue MD Signature Date/Time: 02/06/2021/1:14:21 PM    Final  Scheduled Meds:  allopurinol  100 mg Oral Daily   apixaban  5 mg Oral BID   atorvastatin  10 mg Oral Daily   bethanechol  10 mg Oral TID   cephALEXin  500 mg Oral Q8H   ferrous sulfate  325 mg Oral Daily   fluticasone  2 spray Each Nare Daily   fluticasone furoate-vilanterol  1 puff Inhalation Daily   furosemide  40 mg Intravenous Daily   insulin aspart  0-20 Units Subcutaneous TID WC   insulin aspart  0-5 Units Subcutaneous QHS   insulin aspart  4 Units Subcutaneous TID WC   insulin glargine-yfgn  30 Units Subcutaneous Daily   montelukast  10 mg Oral QHS   pantoprazole  40 mg Oral Daily   umeclidinium bromide  1 puff Inhalation Daily   vitamin B-12  500 mcg Oral Daily   Continuous Infusions:   LOS: 5 days    Time spent: 40 minutes    Irine Seal, MD [facility]   To contact the attending provider between 7A-7P or the covering provider during after hours 7P-7A, please log into the web site [email or web address] and access using universal [place] password for that web site. If you do not have the password, please call the hospital operator.  02/06/2021, 4:30 PM

## 2021-02-07 ENCOUNTER — Inpatient Hospital Stay: Payer: Medicare Other | Admitting: Radiology

## 2021-02-07 ENCOUNTER — Inpatient Hospital Stay: Payer: Medicare Other

## 2021-02-07 DIAGNOSIS — I480 Paroxysmal atrial fibrillation: Secondary | ICD-10-CM | POA: Diagnosis not present

## 2021-02-07 DIAGNOSIS — I350 Nonrheumatic aortic (valve) stenosis: Secondary | ICD-10-CM

## 2021-02-07 DIAGNOSIS — I5031 Acute diastolic (congestive) heart failure: Secondary | ICD-10-CM

## 2021-02-07 HISTORY — PX: IR THORACENTESIS ASP PLEURAL SPACE W/IMG GUIDE: IMG5380

## 2021-02-07 LAB — CBC WITH DIFFERENTIAL/PLATELET
Abs Immature Granulocytes: 0.03 10*3/uL (ref 0.00–0.07)
Basophils Absolute: 0.1 10*3/uL (ref 0.0–0.1)
Basophils Relative: 1 %
Eosinophils Absolute: 0.3 10*3/uL (ref 0.0–0.5)
Eosinophils Relative: 3 %
HCT: 30.2 % — ABNORMAL LOW (ref 36.0–46.0)
Hemoglobin: 9.3 g/dL — ABNORMAL LOW (ref 12.0–15.0)
Immature Granulocytes: 0 %
Lymphocytes Relative: 12 %
Lymphs Abs: 1.3 10*3/uL (ref 0.7–4.0)
MCH: 25.4 pg — ABNORMAL LOW (ref 26.0–34.0)
MCHC: 30.8 g/dL (ref 30.0–36.0)
MCV: 82.5 fL (ref 80.0–100.0)
Monocytes Absolute: 0.6 10*3/uL (ref 0.1–1.0)
Monocytes Relative: 6 %
Neutro Abs: 8.5 10*3/uL — ABNORMAL HIGH (ref 1.7–7.7)
Neutrophils Relative %: 78 %
Platelets: 273 10*3/uL (ref 150–400)
RBC: 3.66 MIL/uL — ABNORMAL LOW (ref 3.87–5.11)
RDW: 17.5 % — ABNORMAL HIGH (ref 11.5–15.5)
WBC: 10.7 10*3/uL — ABNORMAL HIGH (ref 4.0–10.5)
nRBC: 0 % (ref 0.0–0.2)

## 2021-02-07 LAB — RENAL FUNCTION PANEL
Albumin: 3.1 g/dL — ABNORMAL LOW (ref 3.5–5.0)
Anion gap: 9 (ref 5–15)
BUN: 15 mg/dL (ref 8–23)
CO2: 37 mmol/L — ABNORMAL HIGH (ref 22–32)
Calcium: 8.8 mg/dL — ABNORMAL LOW (ref 8.9–10.3)
Chloride: 91 mmol/L — ABNORMAL LOW (ref 98–111)
Creatinine, Ser: 0.78 mg/dL (ref 0.44–1.00)
GFR, Estimated: >60 mL/min (ref >60)
Glucose, Bld: 189 mg/dL — ABNORMAL HIGH (ref 70–99)
Phosphorus: 3.3 mg/dL (ref 2.5–4.6)
Potassium: 3.9 mmol/L (ref 3.5–5.1)
Sodium: 137 mmol/L (ref 135–145)

## 2021-02-07 LAB — GLUCOSE, PLEURAL OR PERITONEAL FLUID: Glucose, Fluid: 179 mg/dL

## 2021-02-07 LAB — BODY FLUID CELL COUNT WITH DIFFERENTIAL
Eos, Fluid: 9 %
Lymphs, Fluid: 6 %
Monocyte-Macrophage-Serous Fluid: 42 %
Neutrophil Count, Fluid: 43 %
Total Nucleated Cell Count, Fluid: 3708 cu mm

## 2021-02-07 LAB — PROTEIN, PLEURAL OR PERITONEAL FLUID: Total protein, fluid: <3 g/dL

## 2021-02-07 LAB — GLUCOSE, CAPILLARY
Glucose-Capillary: 165 mg/dL — ABNORMAL HIGH (ref 70–99)
Glucose-Capillary: 258 mg/dL — ABNORMAL HIGH (ref 70–99)
Glucose-Capillary: 196 mg/dL — ABNORMAL HIGH (ref 70–99)
Glucose-Capillary: 230 mg/dL — ABNORMAL HIGH (ref 70–99)

## 2021-02-07 LAB — BRAIN NATRIURETIC PEPTIDE: B Natriuretic Peptide: 231.6 pg/mL — ABNORMAL HIGH (ref 0.0–100.0)

## 2021-02-07 LAB — ALBUMIN, PLEURAL OR PERITONEAL FLUID: Albumin, Fluid: 1.6 g/dL

## 2021-02-07 LAB — LACTATE DEHYDROGENASE, PLEURAL OR PERITONEAL FLUID: LD, Fluid: 145 U/L — ABNORMAL HIGH (ref 3–23)

## 2021-02-07 LAB — MAGNESIUM: Magnesium: 2 mg/dL (ref 1.7–2.4)

## 2021-02-07 MED ORDER — INSULIN ASPART 100 UNIT/ML IJ SOLN
7.0000 [IU] | Freq: Three times a day (TID) | INTRAMUSCULAR | Status: DC
  Administered 2021-02-08 – 2021-02-11 (×10): 7 [IU] via SUBCUTANEOUS
  Filled 2021-02-07 (×10): qty 1

## 2021-02-07 MED ORDER — TRAZODONE HCL 50 MG PO TABS
50.0000 mg | ORAL_TABLET | Freq: Every day | ORAL | Status: DC
  Administered 2021-02-07 – 2021-02-11 (×5): 50 mg via ORAL
  Filled 2021-02-07 (×5): qty 1

## 2021-02-07 MED ORDER — FUROSEMIDE 10 MG/ML IJ SOLN
40.0000 mg | Freq: Two times a day (BID) | INTRAMUSCULAR | Status: DC
  Administered 2021-02-07 – 2021-02-10 (×7): 40 mg via INTRAVENOUS
  Filled 2021-02-07 (×8): qty 4

## 2021-02-07 MED ORDER — LIDOCAINE HCL 1 % IJ SOLN
INTRAMUSCULAR | Status: AC
  Administered 2021-02-07: 5 mL
  Filled 2021-02-07: qty 20

## 2021-02-07 NOTE — Progress Notes (Signed)
PROGRESS NOTE    Karen [name]  YQI:[id number] DOB: May 06, 1945 DOA: 02/01/2021 PCP: Earlie Counts, FNP    Chief Complaint  Patient presents with   Shortness of Breath    Brief Narrative:  75 year old F with PMH of COPD, chronic hypoxic RF on 3 L, diastolic CHF, moderate aortic stenosis, A. fib, DM-2 and morbid obesity presenting with decreased urine output, SOB, DOE, orthopnea, edema, chest pressure, abdominal pain, distention and diarrhea, and admitted with working diagnosis of acute diastolic CHF.  BNP elevated to 935.  CXR with cardiomegaly.  Foley catheter placed in ED.  Patient received IV Lasix in ER.  CT chest ordered on admission, and showed improved pleural effusion, cardiomegaly and probable liver cirrhosis.    Patient also had encephalopathy the next day.  UA concerning for UTI.   Urine culture with E. coli and Klebsiella pneumonia.  Antibiotics de-escalated to p.o. Keflex.  Patient was improving clinically was to be discharged however on day of discharge and discharge was canceled.  Was noted to desat to 6 L 2D echo obtained, CT chest obtained with increasing moderate right-sided pleural effusion, ultrasound-guided thoracentesis ordered.  Cardiology consulted due to abnormal 2D echo to be seen on 02/07/2021.     Assessment & Plan:   Principal Problem:   Acute diastolic CHF (congestive heart failure) (HCC) Active Problems:   COPD (chronic obstructive pulmonary disease) (HCC)   Hypertension   CKD (chronic kidney disease), stage IIIa   Morbid obesity (HCC)   Aortic stenosis, moderate   AF (paroxysmal atrial fibrillation) (HCC)   Pleural effusion   Acute lower UTI   Acute metabolic encephalopathy   Acute kidney injury superimposed on CKD ([place])  1. Acute hypoxic respiratory distress/acute on chronic diastolic CHF/moderate pleural effusion -Likely multifactorial secondary to acute on chronic diastolic CHF, increasing moderate right-sided pleural effusion, abnormal 2D  echo with mild to moderate MVR, moderate to severe TVR, severely elevated pulmonary artery systolic pressure. -Patient initially was to be discharged 02/05/2021 after diuresis with IV Lasix, however patient noted to desat requiring 6 L nasal cannula and as such discharge canceled. -CT angiogram chest done negative for PE, moderate right, small left pleural effusion associated atelectasis or consolidation slightly increased compared to prior examination, cardiomegaly and CAD. -Echo as below - valvular dz and diastolic dysfunction --Continue IV Lasix  --R thoracentesis planned today --Cardiology consulted, appreciate input. --Strict I's and O's, daily weights.  2.  Acute on chronic diastolic CHF, Valvular heart dz -Concern likely secondary to severely elevated pulmonary arterial systolic pressure, mild to moderate MVR, moderate to severe TVR noted on 2D echo. Net IO Since Admission: -9,260 mL [02/07/21 1932] -2D echo obtained with EF of 60 to 65%, NWMA, grade 1 diastolic dysfunction, severely elevated pulmonary arterial systolic pressure, mild to moderate MVR, moderate to severe TVR, mild aortic valvular stenosis. -Continue Lasix 40 mg IV daily, statin. -Due to abnormality noted on 2D echo we will consult with cardiology for further evaluation and management.  3.  Acute metabolic encephalopathy -??  Etiology.   -May be secondary to UTI. -TSH, ammonia level, vitamin B12, RPR unremarkable. -Encephalopathy improved and patient likely at baseline. -Continue antibiotics.  4.  E. coli UTI/Klebsiella UTI -IV Rocephin >> transitioned to PO Keflex   5.  AKI on CKD stage IIIA -Likely secondary to volume overload/acute diastolic CHF exacerbation. -Urinalysis done on admission concerning for UTI, negative proteinuria. -Renal function improving with diuresis. -Monitor BMP  6.  Chronic hypoxic respiratory failure/chronic COPD -  Patient with no wheezing noted on examination.  Baseline O2 3 L nasal  cannula. Now on 6 L/min O2 -Continue Breo Ellipta, Flonase, Incruse, Singulair, PPI, nebs as needed.  7.  Normocytic anemia -H&H stable.  8.  Uncontrolled insulin-dependent diabetes type 2 with hyperglycemia and other complications -Hemoglobin A1c 9.8 (02/03/2021) -CBG 148 this morning. -Continue Semglee 30 units daily, increase mealtime NovoLog 4>>7 units 3 times daily, SSI. -Outpatient follow-up.  9.  Paroxysmal atrial fibrillation -In sinus rhythm. -Not on rate or rhythm control medication likely due to history of bradycardia. -Eliquis for anticoagulation.  10.  Hypertension -Blood pressure noted to be soft and as such ARB and HCTZ on hold. -On IV Lasix.  11.  Chronic back pain -Home regimen on hold due to encephalopathy.  Could likely reintroduce slowly.  12.  Hypervolemic hyponatremia -In the setting of ARB and HCTZ. -HCTZ on hold and likely will not resume on discharge. -Continue to hold ARB. -Improved with diuresis.  71.  Morbid obesity -Outpatient follow-up with PCP   DVT prophylaxis: Eliquis Code Status: DNR Family Communication: Updated patient.  No family at bedside. Disposition:   Status is: Inpatient  Remains inpatient appropriate because: Severity of illness     Consultants:  Radiology Cardiology   Procedures:  CT chest 02/01/2021, CT angiogram chest 02/05/2021 Chest x-ray 02/02/2019 Abdominal ultrasound 02/02/2021 2D echo 02/06/2021 R thoracentesis 02/07/2021  Antimicrobials:  Keflex 02/03/2021>>>> 02/08/2021 IV Rocephin 02/02/2021>>>> 02/03/2021   Subjective: Patient reports dry cough and shortness of breath.  No chest pain, fever or chills.  Not sleeping well some nights and requests something to help her sleep.  We discussed thoracentesis procedure, she is agreeable.  Objective: Vitals:   02/07/21 1145 02/07/21 1344 02/07/21 1347 02/07/21 1502  BP: (!) 120/43   (!) 117/53  Pulse: 86   93  Resp: 16   20  Temp: 98.2 F (36.8 C)   98.2  F (36.8 C)  TempSrc:    Oral  SpO2: 91% 97% 92% 94%  Weight:      Height:        Intake/Output Summary (Last 24 hours) at 02/07/2021 1926 Last data filed at 02/07/2021 1837 Gross per 24 hour  Intake 960 ml  Output 2450 ml  Net -1490 ml   Filed Weights   02/03/21 0338 02/05/21 0444 02/06/21 0500  Weight: 105.1 kg 102.8 kg 103.2 kg    Examination:  General exam: awake, alert, no acute distress, obese HEENT: moist mucus membranes, hearing grossly normal  Respiratory system: CTAB  diminished bases, no wheezes, normal respiratory effort on 6 l/min [place] O2. Cardiovascular system: normal X8/P3, RRR, 2/6 systolic murmur.   Gastrointestinal system: soft, non-tender abdomen. Central nervous system: A&O x3. no gross focal neurologic deficits, normal speech Extremities: moves all, no cyanosis, normal tone Skin: dry, intact, normal temperature Psychiatry: normal mood, congruent affect, judgement and insight appear normal     Data Reviewed: I have personally reviewed following labs and imaging studies  CBC: Recent Labs  Lab 02/03/21 0414 02/04/21 0612 02/05/21 0531 02/06/21 1057 02/07/21 0448  WBC 9.6 9.7 9.8 9.2 10.7*  NEUTROABS  --   --   --  7.3 8.5*  HGB 8.7* 8.8* 9.2* 9.2* 9.3*  HCT 29.1* 29.5* 29.8* 30.0* 30.2*  MCV 82.2 82.6 83.5 82.6 82.5  PLT 295 285 267 280 825    Basic Metabolic Panel: Recent Labs  Lab 02/03/21 0414 02/04/21 0612 02/05/21 0531 02/06/21 1057 02/07/21 0448  NA 132* 135 136  136 137  K 4.5 4.1 3.8 3.8 3.9  CL 98 99 94* 92* 91*  CO2 30 32 33* 38* 37*  GLUCOSE 194* 140* 155* 202* 189*  BUN 42* 26* 18 14 15   CREATININE 1.14* 0.82 0.66 0.70 0.78  CALCIUM 8.5* 8.6* 8.6* 8.9 8.8*  MG 2.0 1.7 2.0 1.8 2.0  PHOS 4.1 3.2 3.8  --  3.3    GFR: Estimated Creatinine Clearance: 68.7 mL/min (by C-G formula based on SCr of 0.78 mg/dL).  Liver Function Tests: Recent Labs  Lab 02/03/21 0414 02/04/21 0612 02/05/21 0531 02/06/21 1057 02/07/21 0448   AST  --   --   --  12*  --   ALT  --   --   --  9  --   ALKPHOS  --   --   --  75  --   BILITOT  --   --   --  0.8  --   PROT  --   --   --  6.1*  --   ALBUMIN 3.0* 3.1* 3.0* 3.0* 3.1*    CBG: Recent Labs  Lab 02/06/21 1610 02/06/21 2118 02/07/21 0805 02/07/21 1147 02/07/21 1606  GLUCAP 209* 115* 165* 258* 196*     Recent Results (from the past 240 hour(s))  Resp Panel by RT-PCR (Flu A&B, Covid) Nasopharyngeal Swab     Status: None   Collection Time: 02/01/21 12:56 PM   Specimen: Nasopharyngeal Swab; Nasopharyngeal(NP) swabs in vial transport medium  Result Value Ref Range Status   SARS Coronavirus 2 by RT PCR NEGATIVE NEGATIVE Final    Comment: (NOTE) SARS-CoV-2 target nucleic acids are NOT DETECTED.  The SARS-CoV-2 RNA is generally detectable in upper respiratory specimens during the acute phase of infection. The lowest concentration of SARS-CoV-2 viral copies this assay can detect is 138 copies/mL. A negative result does not preclude SARS-Cov-2 infection and should not be used as the sole basis for treatment or other patient management decisions. A negative result may occur with  improper specimen collection/handling, submission of specimen other than nasopharyngeal swab, presence of viral mutation(s) within the areas targeted by this assay, and inadequate number of viral copies(<138 copies/mL). A negative result must be combined with clinical observations, patient history, and epidemiological information. The expected result is Negative.  Fact Sheet for Patients:  EntrepreneurPulse.com.au  Fact Sheet for Healthcare Providers:  IncredibleEmployment.be  This test is no t yet approved or cleared by the [place] FDA and  has been authorized for detection and/or diagnosis of SARS-CoV-2 by FDA under an Emergency Use Authorization (EUA). This EUA will remain  in effect (meaning this test can be used) for the duration of  the COVID-19 declaration under Section 564(b)(1) of the Act, 21 U.S.C.section 360bbb-3(b)(1), unless the authorization is terminated  or revoked sooner.       Influenza A by PCR NEGATIVE NEGATIVE Final   Influenza B by PCR NEGATIVE NEGATIVE Final    Comment: (NOTE) The Xpert Xpress SARS-CoV-2/FLU/RSV plus assay is intended as an aid in the diagnosis of influenza from Nasopharyngeal swab specimens and should not be used as a sole basis for treatment. Nasal washings and aspirates are unacceptable for Xpert Xpress SARS-CoV-2/FLU/RSV testing.  Fact Sheet for Patients: EntrepreneurPulse.com.au  Fact Sheet for Healthcare Providers: IncredibleEmployment.be  This test is not yet approved or cleared by the [place] FDA and has been authorized for detection and/or diagnosis of SARS-CoV-2 by FDA under an Emergency Use Authorization (EUA). This EUA will remain  in effect (meaning this test can be used) for the duration of the COVID-19 declaration under Section 564(b)(1) of the Act, 21 U.S.C. section 360bbb-3(b)(1), unless the authorization is terminated or revoked.  Performed at [facility], [address] [place], [place] [address]   Urine Culture     Status: Abnormal   Collection Time: 02/01/21  1:42 PM   Specimen: Urine, Catheterized  Result Value Ref Range Status   Specimen Description   Final    URINE, CATHETERIZED Performed at [facility], [address] [place], [place] [address]    Special Requests   Final    NONE Performed at [facility], [address] [place], [place] [address]    Culture (A)  Final    >=100,000 COLONIES/mL ESCHERICHIA COLI 80,000 COLONIES/mL KLEBSIELLA PNEUMONIAE    Report Status 02/03/2021 FINAL  Final   Organism ID, Bacteria ESCHERICHIA COLI (A)  Final   Organism ID, Bacteria KLEBSIELLA PNEUMONIAE (A)  Final      Susceptibility   Escherichia coli - MIC*    AMPICILLIN 8  SENSITIVE Sensitive     CEFAZOLIN <=4 SENSITIVE Sensitive     CEFEPIME <=0.12 SENSITIVE Sensitive     CEFTRIAXONE <=0.25 SENSITIVE Sensitive     CIPROFLOXACIN <=0.25 SENSITIVE Sensitive     GENTAMICIN <=1 SENSITIVE Sensitive     IMIPENEM <=0.25 SENSITIVE Sensitive     NITROFURANTOIN <=16 SENSITIVE Sensitive     TRIMETH/SULFA <=20 SENSITIVE Sensitive     AMPICILLIN/SULBACTAM 4 SENSITIVE Sensitive     PIP/TAZO <=4 SENSITIVE Sensitive     * >=100,000 COLONIES/mL ESCHERICHIA COLI   Klebsiella pneumoniae - MIC*    AMPICILLIN >=32 RESISTANT Resistant     CEFAZOLIN <=4 SENSITIVE Sensitive     CEFEPIME <=0.12 SENSITIVE Sensitive     CEFTRIAXONE <=0.25 SENSITIVE Sensitive     CIPROFLOXACIN <=0.25 SENSITIVE Sensitive     GENTAMICIN <=1 SENSITIVE Sensitive     IMIPENEM <=0.25 SENSITIVE Sensitive     NITROFURANTOIN 64 INTERMEDIATE Intermediate     TRIMETH/SULFA <=20 SENSITIVE Sensitive     AMPICILLIN/SULBACTAM 4 SENSITIVE Sensitive     PIP/TAZO <=4 SENSITIVE Sensitive     * 80,000 COLONIES/mL KLEBSIELLA PNEUMONIAE  Resp Panel by RT-PCR (Flu A&B, Covid) Nasopharyngeal Swab     Status: None   Collection Time: 02/04/21  5:09 PM   Specimen: Nasopharyngeal Swab; Nasopharyngeal(NP) swabs in vial transport medium  Result Value Ref Range Status   SARS Coronavirus 2 by RT PCR NEGATIVE NEGATIVE Final    Comment: (NOTE) SARS-CoV-2 target nucleic acids are NOT DETECTED.  The SARS-CoV-2 RNA is generally detectable in upper respiratory specimens during the acute phase of infection. The lowest concentration of SARS-CoV-2 viral copies this assay can detect is 138 copies/mL. A negative result does not preclude SARS-Cov-2 infection and should not be used as the sole basis for treatment or other patient management decisions. A negative result may occur with  improper specimen collection/handling, submission of specimen other than nasopharyngeal swab, presence of viral mutation(s) within the areas targeted  by this assay, and inadequate number of viral copies(<138 copies/mL). A negative result must be combined with clinical observations, patient history, and epidemiological information. The expected result is Negative.  Fact Sheet for Patients:  EntrepreneurPulse.com.au  Fact Sheet for Healthcare Providers:  IncredibleEmployment.be  This test is no t yet approved or cleared by the [place] FDA and  has been authorized for detection and/or diagnosis of SARS-CoV-2 by FDA under an Emergency  Use Authorization (EUA). This EUA will remain  in effect (meaning this test can be used) for the duration of the COVID-19 declaration under Section 564(b)(1) of the Act, 21 U.S.C.section 360bbb-3(b)(1), unless the authorization is terminated  or revoked sooner.       Influenza A by PCR NEGATIVE NEGATIVE Final   Influenza B by PCR NEGATIVE NEGATIVE Final    Comment: (NOTE) The Xpert Xpress SARS-CoV-2/FLU/RSV plus assay is intended as an aid in the diagnosis of influenza from Nasopharyngeal swab specimens and should not be used as a sole basis for treatment. Nasal washings and aspirates are unacceptable for Xpert Xpress SARS-CoV-2/FLU/RSV testing.  Fact Sheet for Patients: EntrepreneurPulse.com.au  Fact Sheet for Healthcare Providers: IncredibleEmployment.be  This test is not yet approved or cleared by the [place] FDA and has been authorized for detection and/or diagnosis of SARS-CoV-2 by FDA under an Emergency Use Authorization (EUA). This EUA will remain in effect (meaning this test can be used) for the duration of the COVID-19 declaration under Section 564(b)(1) of the Act, 21 U.S.C. section 360bbb-3(b)(1), unless the authorization is terminated or revoked.  Performed at [facility], [place], [place] [address]   Body fluid culture w Gram Stain     Status: None (Preliminary result)    Collection Time: 02/07/21 10:09 AM   Specimen: PATH Cytology Pleural fluid  Result Value Ref Range Status   Specimen Description PLEURAL  Final   Special Requests   Final    NONE Performed at [facility], [place] [address], [place], [place] [address]    Gram Stain PENDING  Incomplete   Culture PENDING  Incomplete   Report Status PENDING  Incomplete         Radiology Studies: DG Chest Port 1 View  Result Date: 02/07/2021 CLINICAL DATA:  Pleural effusion, status post thoracentesis EXAM: PORTABLE CHEST 1 VIEW COMPARISON:  Previous studies including the CT done on 2021/02/26 FINDINGS: Transverse diameter of heart is increased. Central pulmonary vessels are prominent. There is prominence of interstitial markings in the parahilar regions and lower lung fields. There is no new focal consolidation. There is blunting of both lateral CP angles. There is no pneumothorax. IMPRESSION: There is no pneumothorax. Small bilateral pleural effusions. Cardiomegaly. There is prominence of interstitial markings in the parahilar regions and lower lung fields suggesting mild interstitial edema or interstitial pneumonitis. Electronically Signed   By: Elmer Picker M.D.   On: 02/07/2021 10:40   ECHOCARDIOGRAM COMPLETE  Result Date: 02/06/2021    ECHOCARDIOGRAM REPORT   Patient Name:   OCIE STANZIONE Date of Exam: 02/06/2021 Medical Rec #:  [id number]        Height:       63.0 in Accession #:    [id number]       Weight:       227.5 lb Date of Birth:  19-Jul-1945       BSA:          2.043 m Patient Age:    69 years         BP:           138/56 mmHg Patient Gender: F                HR:           73 bpm. Exam Location:  [facility] Procedure: 2D Echo and Intracardiac Opacification Agent Indications:     Diastolic CHF  History:  Patient has prior history of Echocardiogram examinations. CHF,                  COPD, Aortic Valve Disease, Arrythmias:Atrial Fibrillation;                  Risk Factors:Morbid  Obesity.  Sonographer:     L Thornton-Maynard Referring Phys:  [id number] [facility] Diagnosing Phys: Ida Rogue MD  Sonographer Comments: Image acquisition challenging due to COPD and Image acquisition challenging due to patient body habitus. IMPRESSIONS  1. Left ventricular ejection fraction, by estimation, is 60 to 65%. The left ventricle has normal function. The left ventricle has no regional wall motion abnormalities. Left ventricular diastolic parameters are consistent with Grade I diastolic dysfunction (impaired relaxation).  2. Right ventricular systolic function is mildly reduced. The right ventricular size is mildly enlarged. There is severely elevated pulmonary artery systolic pressure. The estimated right ventricular systolic pressure is 98.3 mmHg.  3. The mitral valve is normal in structure. Mild to moderate mitral valve regurgitation. No evidence of mitral stenosis.  4. Tricuspid valve regurgitation is moderate to severe.  5. The aortic valve is normal in structure. Aortic valve regurgitation is not visualized. Mild aortic valve stenosis. Aortic valve mean gradient measures 17.0 mmHg. Aortic valve Vmax measures 2.71 m/s.  6. The inferior vena cava is dilated in size with >50% respiratory variability, suggesting right atrial pressure of 8 mmHg. FINDINGS  Left Ventricle: Left ventricular ejection fraction, by estimation, is 60 to 65%. The left ventricle has normal function. The left ventricle has no regional wall motion abnormalities. Definity contrast agent was given IV to delineate the left ventricular  endocardial borders. The left ventricular internal cavity size was normal in size. There is no left ventricular hypertrophy. Left ventricular diastolic parameters are consistent with Grade I diastolic dysfunction (impaired relaxation). Right Ventricle: The right ventricular size is mildly enlarged. No increase in right ventricular wall thickness. Right ventricular systolic function is mildly reduced.  There is severely elevated pulmonary artery systolic pressure. The tricuspid regurgitant velocity is 3.68 m/s, and with an assumed right atrial pressure of 10 mmHg, the estimated right ventricular systolic pressure is 38.2 mmHg. Left Atrium: Left atrial size was normal in size. Right Atrium: Right atrial size was normal in size. Pericardium: There is no evidence of pericardial effusion. Mitral Valve: The mitral valve is normal in structure. Mild to moderate mitral valve regurgitation. No evidence of mitral valve stenosis. Tricuspid Valve: The tricuspid valve is normal in structure. Tricuspid valve regurgitation is moderate to severe. No evidence of tricuspid stenosis. Aortic Valve: The aortic valve is normal in structure. Aortic valve regurgitation is not visualized. Mild aortic stenosis is present. Aortic valve mean gradient measures 17.0 mmHg. Aortic valve peak gradient measures 29.4 mmHg. Aortic valve area, by VTI measures 0.86 cm. Pulmonic Valve: The pulmonic valve was normal in structure. Pulmonic valve regurgitation is not visualized. No evidence of pulmonic stenosis. Aorta: The aortic root is normal in size and structure. Venous: The inferior vena cava is dilated in size with greater than 50% respiratory variability, suggesting right atrial pressure of 8 mmHg. IAS/Shunts: No atrial level shunt detected by color flow Doppler.  LEFT VENTRICLE PLAX 2D LVIDd:         4.50 cm   Diastology LVIDs:         2.90 cm   LV e' medial:    5.00 cm/s LV PW:         1.00 cm  LV E/e' medial:  17.3 LV IVS:        1.00 cm   LV e' lateral:   8.59 cm/s LVOT diam:     1.80 cm   LV E/e' lateral: 10.1 LV SV:         46 LV SV Index:   23 LVOT Area:     2.54 cm  RIGHT VENTRICLE RV S prime:     13.10 cm/s TAPSE (M-mode): 2.3 cm LEFT ATRIUM             Index        RIGHT ATRIUM           Index LA diam:        3.70 cm 1.81 cm/m   RA Area:     20.70 cm LA Vol (A2C):   53.9 ml 26.39 ml/m  RA Volume:   65.70 ml  32.17 ml/m LA Vol  (A4C):   62.6 ml 30.65 ml/m LA Biplane Vol: 59.0 ml 28.89 ml/m  AORTIC VALVE                     PULMONIC VALVE AV Area (Vmax):    0.75 cm      PV Vmax:       1.15 m/s AV Area (Vmean):   0.75 cm      PV Peak grad:  5.3 mmHg AV Area (VTI):     0.86 cm AV Vmax:           271.00 cm/s AV Vmean:          197.000 cm/s AV VTI:            0.533 m AV Peak Grad:      29.4 mmHg AV Mean Grad:      17.0 mmHg LVOT Vmax:         79.60 cm/s LVOT Vmean:        58.400 cm/s LVOT VTI:          0.181 m LVOT/AV VTI ratio: 0.34  AORTA Ao Root diam: 2.90 cm Ao Asc diam:  2.90 cm MITRAL VALVE                TRICUSPID VALVE MV Area (PHT): 3.17 cm     TR Peak grad:   54.2 mmHg MV Decel Time: 239 msec     TR Vmax:        368.00 cm/s MV E velocity: 86.50 cm/s MV A velocity: 116.00 cm/s  SHUNTS MV E/A ratio:  0.75         Systemic VTI:  0.18 m                             Systemic Diam: 1.80 cm Ida Rogue MD Electronically signed by Ida Rogue MD Signature Date/Time: 02/06/2021/1:14:21 PM    Final    IR THORACENTESIS ASP PLEURAL SPACE W/IMG GUIDE  Result Date: 02/07/2021 INDICATION: Shortness of breath with right pleural effusion request received for diagnostic and therapeutic thoracentesis. EXAM: ULTRASOUND GUIDED RIGHT THORACENTESIS MEDICATIONS: Local 1% lidocaine only. COMPLICATIONS: None immediate. PROCEDURE: An ultrasound guided thoracentesis was thoroughly discussed with the patient and questions answered. The benefits, risks, alternatives and complications were also discussed. The patient understands and wishes to proceed with the procedure. Written consent was obtained. Ultrasound was performed to localize and mark an adequate pocket of fluid in the right chest. The area was then prepped and draped  in the normal sterile fashion. 1% Lidocaine was used for local anesthesia. Under ultrasound guidance a 19 gauge, 7-cm, Yueh catheter was introduced. Thoracentesis was performed. The catheter was removed and a dressing applied.  FINDINGS: A total of approximately 600 mL of clear yellow fluid was removed. Samples were sent to the laboratory as requested by the clinical team. IMPRESSION: Successful ultrasound guided RIGHT thoracentesis yielding 600 mL of pleural fluid. Read By: Tsosie Billing PA-C Electronically Signed   By: Michaelle Birks M.D.   On: 02/07/2021 12:01        Scheduled Meds:  allopurinol  100 mg Oral Daily   apixaban  5 mg Oral BID   atorvastatin  10 mg Oral Daily   bethanechol  10 mg Oral TID   cephALEXin  500 mg Oral Q8H   ferrous sulfate  325 mg Oral Daily   fluticasone  2 spray Each Nare Daily   fluticasone furoate-vilanterol  1 puff Inhalation Daily   furosemide  40 mg Intravenous BID   insulin aspart  0-20 Units Subcutaneous TID WC   insulin aspart  0-5 Units Subcutaneous QHS   insulin aspart  4 Units Subcutaneous TID WC   insulin glargine-yfgn  30 Units Subcutaneous Daily   montelukast  10 mg Oral QHS   pantoprazole  40 mg Oral Daily   traZODone  50 mg Oral QHS   umeclidinium bromide  1 puff Inhalation Daily   vitamin B-12  500 mcg Oral Daily   Continuous Infusions:   LOS: 6 days    Time spent: 30 minutes    Ezekiel [place], DO [facility]   To contact the attending provider between 7A-7P or the covering provider during after hours 7P-7A, please log into the web site [email or web address] and access using universal  password for that web site. If you do not have the password, please call the hospital operator.  02/07/2021, 7:26 PM

## 2021-02-07 NOTE — Progress Notes (Signed)
Physical Therapy Treatment Patient Details Name: Karen [name] MRN: [id number] DOB: 1945-11-20 Today's Date: 02/07/2021   History of Present Illness 75 year old F with PMH of COPD, chronic hypoxic RF on 3 L, diastolic CHF, moderate aortic stenosis, A. fib, DM-2 and morbid obesity presenting with decreased urine output, SOB, DOE, orthopnea, edema, chest pressure, abdominal pain, distention and diarrhea, and admitted with working diagnosis of acute diastolic CHF.    PT Comments    Pt received in Semi-Fowler's position and agreeable to therapy.  Pt adverse to therapy at first due to lunch arriving soon, but understood that therapy was going to be beneficial.  Pt was able to perform bed-level exercises with good technique, however O2 saturations still declined to low 80's.  Pt O2 increased to 7L with moderate change to O2 sats.  Pt continued with bed-level exercises and O2 increased to 8L of O2 and pt was able to maintain >90%.  Once in standing pt saturations dropped again and required increased to 10L where she was able to maintain >90% throughout remaining session.  Clean bedding and gown applied and pt assisted with self-care prior to eating.  Pt grateful for bedding change as she was in soiled linens upon arrival.  Pt then left with all needs met, and pt preparing for lunch.  Nursing notified of bedding change and O2 saturation levels as well as increased supplemental O2.  Current discharge plans to SNF remain appropriate at this time.  Pt will continue to benefit from skilled therapy in order to address deficits listed below.     Recommendations for follow up therapy are one component of a multi-disciplinary discharge planning process, led by the attending physician.  Recommendations may be updated based on patient status, additional functional criteria and insurance authorization.  Follow Up Recommendations  Skilled nursing-short term rehab (<3 hours/day)     Assistance Recommended at  Discharge Intermittent Supervision/Assistance  Equipment Recommendations  Other (comment)    Recommendations for Other Services       Precautions / Restrictions Precautions Precautions: Fall Restrictions Weight Bearing Restrictions: No     Mobility  Bed Mobility Overal bed mobility: Needs Assistance Bed Mobility: Supine to Sit;Sit to Supine     Supine to sit: Min assist Sit to supine: Min assist   General bed mobility comments: Pt able to bring LE's back into bed with minA    Transfers Overall transfer level: Needs assistance Equipment used: Rolling walker (2 wheels) Transfers: Sit to/from Stand Sit to Stand: Min assist;Min guard                Ambulation/Gait Ambulation/Gait assistance: Min assist Gait Distance (Feet): 5 Feet     Gait velocity: decreased     General Gait Details: pt able to tolerate side stepping at EOB.  Pt's O2 sats were in high 80's and takes several minutes of recovery and pursed lip breathign to return to >90%.  Pt had to be bumped up to 10L of O2 for recovery after ambulation.   Stairs             Wheelchair Mobility    Modified Rankin (Stroke Patients Only)       Balance Overall balance assessment: Needs assistance Sitting-balance support: Feet supported Sitting balance-Leahy Scale: Good     Standing balance support: During functional activity;Bilateral upper extremity supported;Reliant on assistive device for balance Standing balance-Leahy Scale: Poor Standing balance comment: deferred  Cognition Arousal/Alertness: Awake/alert Behavior During Therapy: WFL for tasks assessed/performed;Flat affect Overall Cognitive Status: Within Functional Limits for tasks assessed                                          Exercises      General Comments        Pertinent Vitals/Pain Pain Assessment: No/denies pain    Home Living                           Prior Function            PT Goals (current goals can now be found in the care plan section) Acute Rehab PT Goals Patient Stated Goal: rehab at [facility] PT Goal Formulation: With patient Time For Goal Achievement: 02/16/21 Potential to Achieve Goals: Good Progress towards PT goals: Progressing toward goals    Frequency    Min 2X/week      PT Plan Current plan remains appropriate    Co-evaluation              AM-PAC PT "6 Clicks" Mobility   Outcome Measure  Help needed turning from your back to your side while in a flat bed without using bedrails?: A Little Help needed moving from lying on your back to sitting on the side of a flat bed without using bedrails?: A Little Help needed moving to and from a bed to a chair (including a wheelchair)?: A Lot Help needed standing up from a chair using your arms (e.g., wheelchair or bedside chair)?: A Lot Help needed to walk in hospital room?: A Lot Help needed climbing 3-5 steps with a railing? : A Lot 6 Click Score: 14    End of Session Equipment Utilized During Treatment: Gait belt;Oxygen Activity Tolerance: Patient limited by fatigue Patient left: in bed;with call bell/phone within reach;with bed alarm set Nurse Communication: Mobility status PT Visit Diagnosis: Muscle weakness (generalized) (M62.81);Unsteadiness on feet (R26.81);Difficulty in walking, not elsewhere classified (R26.2)     Time: 3790-2409 PT Time Calculation (min) (ACUTE ONLY): 57 min  Charges:  $Gait Training: 8-22 mins $Therapeutic Exercise: 8-22 mins $Therapeutic Activity: 8-22 mins $Self Care/Home Management: 8-22                     Gwenlyn Saran, PT, DPT 02/07/21, 1:11 PM    Christie Nottingham 02/07/2021, 1:06 PM

## 2021-02-07 NOTE — TOC Progression Note (Signed)
Transition of Care [facility]) - Progression Note    Patient Details  Name: Karen [name] MRN: [id number] Date of Birth: 09/13/1945  Transition of Care [facility]) CM/SW [place], [place] Phone Number: 02/07/2021, 3:41 PM  Clinical Narrative:     CSW informed Rickey at [facility] that currently working on weaning patient's high O2 needs to then dc to [facility] when medically stable.   Will continue to provide updates as needed. CSW notes insurance auth may need to be re started depending on continued length of stay in hospital.   Expected Discharge Plan: [place] Barriers to Discharge: Continued Medical Work up  Expected Discharge Plan and Services Expected Discharge Plan: [place] Choice: [place] arrangements for the past 2 months: Single Family Home Expected Discharge Date: 02/05/21                                     Social Determinants of Health (SDOH) Interventions    Readmission Risk Interventions Readmission Risk Prevention Plan 10/30/2020 06/21/2020 09/30/2019  Transportation Screening Complete Complete Complete  PCP or Specialist Appt within 5-7 Days - - -  PCP or Specialist Appt within 3-5 Days - - Complete  Home Care Screening - - -  Medication Review (RN CM) - - -  Social Work Scientific laboratory technician for [place] Planning/Counseling - - Complete  Palliative Care Screening - - Not Applicable  Medication Review Press photographer) Complete Complete Complete  PCP or Specialist appointment within 3-5 days of discharge Complete Complete -  PCP/Specialist Appt Not Complete comments - - -  HRI or Home Care Consult Patient refused Complete -  SW Recovery Care/Counseling Consult Complete Complete -  Palliative Care Screening Not Applicable Not Applicable -  [place] Not Applicable Not Applicable -  Some recent data might be hidden

## 2021-02-07 NOTE — Care Management Important Message (Signed)
Important Message  Patient Details  Name: Karen [name] MRN: [id number] Date of Birth: [birth year]   Medicare Important Message Given:  Yes     [name] 02/07/2021, 12:23 PM

## 2021-02-07 NOTE — Progress Notes (Signed)
Inpatient Diabetes Program Recommendations  AACE/ADA: New Consensus Statement on Inpatient Glycemic Control   Target Ranges:  Prepandial:   less than 140 mg/dL      Peak postprandial:   less than 180 mg/dL (1-2 hours)      Critically ill patients:  140 - 180 mg/dL    Latest Reference Range & Units 02/06/21 07:27 02/06/21 11:48 02/06/21 16:10 02/06/21 21:18 02/07/21 08:05  Glucose-Capillary 70 - 99 mg/dL 148 (H) 209 (H) 209 (H) 115 (H) 165 (H)   Review of Glycemic Control  Diabetes history: DM2 Outpatient Diabetes medications:Lantus 38 units daily, Humalog 10 units TID with meals, Metformin 1000 mg BID Current orders for Inpatient glycemic control: Semglee 30 units daily, Novolog 0-20 units TID with meals, Novolog 0-5 units QHS, Novolog 4 units TID with meals   Inpatient Diabetes Program Recommendations:     Insulin: Please consider increasing meal coverage to Novolog 7 units TID with meals if patient eats at least 50% of meals.   Thanks, Barnie Alderman, RN, MSN, CDE Diabetes Coordinator Inpatient Diabetes Program [phone] (Team Pager from 8am to 5pm)

## 2021-02-07 NOTE — Consult Note (Signed)
Cardiology Consultation:   Patient ID: Karen [name]; [id number]; March 01, 1946   Admit date: 02/01/2021 Date of Consult: 02/07/2021  Primary Care Provider: Earlie [name], [place] Primary Cardiologist: Rockey Situ Primary Electrophysiologist:  None   Patient Profile:   Karen [name] is a 75 y.o. female with a hx of HFpEF, severe pulmonary hypertension, moderate aortic stenosis, junctional bradycardia with BB/CCB, PAF by 03/2019 monitor, chronic chest pain, GI bleed, Covid in 10/2020, DM2, HTN, HLD, chronic hypoxic respiratory failure on supplemental oxygen, COPD, anemia, and obesity who is being seen today for the evaluation of acute on chronic HFpEF/pulmonary hypertension at the request of Dr. Grandville Silos.  History of Present Illness:   Karen [name] has a known history of junctional bradycardia in the setting of prior beta blocker usage. Echocardiograms in 06/2018 and 08/2018 showed low normal LVSF with an EF of 50-55%. In 08/2018, she was admitted with junctional bradycardia and hypotension. Bisoprolol was stopped. She required a brief course of dopamine with subsequent stabilization and discharge without recommendation for PPM at that time. 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 She required 1 unit of pRBCs. She was in 02/2019 for junctional bradycardia. Echo at that time showed an EF of 65-70%, no RWMA, trivial MR/TR/PR, and moderately elevated PASP. It was noted she was still taking bisoprolol, despite this medication having been discontinued in 08/2018.  Outpatient cardiac monitoring in 02/2021 showed new onset Afib with recommendation to start Eliquis at that time. Echo in 09/2019 showed an EF of 55-60%, no RWMA, mild LVH, Gr1DD, normal RVSF and ventricular cavity size, PASP 63.6 mmHg, mildly dilated left atrium, trivial MR, and moderate aortic stenosis. Repeat echo in  06/2020 showed an EF of 60-65%, mild LVH, normal RVSF and ventricular cavity size, and stable measurements of the aortic valve when compared to 09/2019 study with overall difficult to visualize aortic valve. She was admitted to [facility] in 07/2020 with bacteremia due to [facility]. She has had frequent ED evaluations and hospital admissions over the years.   She was admitted on 11/29 with presumed acute on chronic respiratory failure felt to be secondary to HFpEF and pulmonary hypertension with admission complicated by encephalopathy possibly related to E coli UTI and AKI that has improved. CT without contrast upon admission showed similar, but improved small right pleural effusion layering dependently with dependent atelectasis that was improved when compared to study in 10/2020. Abdominal ultrasound without ascites. She was diuresed with IV Lasix with a reported weight trend form 260 pounds on admission to a weight of 227.5 pounds yesterday. She was initially planned for discharge on 12/3, however she developed worsening respiratory status requiring 6 L supplemental oxygen via nasal cannula. Given this, her discharge was cancelled. CTA chest on 12/3 showed no evidence of PE, moderate right and small left pleural effusions with associated atelectasis or consolidation and were slightly increased in size when compared to prior, cardiomegaly, coronary artery calcifications and aortic atherosclerosis. Subsequent echo on 12/4 demonstrated an EF of 60-65%, no RWMA, Gr1DD, mildly reduced RVSF with mildly enlarged RV cavity size, severely elevated PASP estimated at 64.2 mmHg, mild to moderate mitral regurgitation, moderate to severe tricuspid regurgitation, mild aortic stenosis with a mean gradient of 17 mmHg and an estimated right atrial pressure of 8 mmHg. IV diuresis was continued with noted vigorous UOP on 12/4. Currently, she is 7.2 L negative  for the admission with weight pending this morning. Troponin  negative x 2. BNP improving throughout her admission with an initial value of 935 trending to 231 this morning. She underwent ultrasound-guided thoracentesis this morning with final report pending.  Labs/cytology pending. Following thoracentesis, she reports some improvement in her dyspnea, though not to baseline. She remains on 6 L supplemental oxygen via nasal cannula, baseline 3 L. No chest pain, palpitations, dizziness, presyncope, or syncope. Lower extremity swelling improved. She remains on IV Lasix 40 mg daily.    Past Medical History:  Diagnosis Date   (HFpEF) heart failure with preserved ejection fraction ([place])    a. 2017 Echo: EF 50%; b. 06/2018 Echo: EF 50-55%; c. 08/2018 Echo: EF 50-55%, Nl RV fxn; d. 09/2019 Echo: EF 55-60%, no rwma, mild LVH, Gr1 DD, nl RV size/fxn, PASP 63.70mmHg. Mildly dil LA. Triv MR. Mod AS (AoV 0.94cm^2 VTI; mean grad 17.53mmHg).   Acute on chronic respiratory failure with hypoxia and hypercapnia (HCC) 01/07/2015   Anemia    Asterixis 01/07/2015   Asthma    Cataract    CKD (chronic kidney disease), stage III (HCC)    COPD (chronic obstructive pulmonary disease) ([place])    a. 06/2018 tobacco use, home 3L oxygen    Diabetes mellitus without complication ([place])    a. 09/2019 A1C 8.8   Edema, peripheral 04/20/2014   GI bleed 06/28/2019   History of kidney stones    Hyperlipidemia    Hypertension    Iron deficiency anemia 06/22/2014   Junctional bradycardia    a. In setting of beta blocker therapy.   Leucocytosis 10/19/2015   Moderate aortic stenosis    a.  09/2019 Echo: Mod AS (AoV 0.94cm^2 VTI; mean grad 17.88mmHg).   Morbid obesity ([place])    Overactive bladder    Primary osteoarthritis of right knee 09/01/2016   Sciatica 01/07/2015    Past Surgical History:  Procedure Laterality Date   APPENDECTOMY     CESAREAN SECTION     x3   CHOLECYSTECTOMY     COLONOSCOPY WITH PROPOFOL N/A 08/28/2017   Procedure: COLONOSCOPY WITH PROPOFOL;  Surgeon: Lucilla Lame, MD;   Location: [facility] ENDOSCOPY;  Service: Endoscopy;  Laterality: N/A;   COLONOSCOPY WITH PROPOFOL N/A 08/29/2017   Procedure: COLONOSCOPY WITH PROPOFOL;  Surgeon: Lucilla Lame, MD;  Location: [facility] ENDOSCOPY;  Service: Endoscopy;  Laterality: N/A;   CYSTOSCOPY W/ URETERAL STENT PLACEMENT Right 09/15/2017   Procedure: CYSTOSCOPY WITH RETROGRADE PYELOGRAM/URETERAL STENT PLACEMENT;  Surgeon: Cleon Gustin, MD;  Location: [facility] ORS;  Service: Urology;  Laterality: Right;   CYSTOSCOPY/URETEROSCOPY/HOLMIUM LASER/STENT PLACEMENT Right 10/09/2017   Procedure: CYSTOSCOPY/URETEROSCOPY/HOLMIUM LASER/STENT PLACEMENT;  Surgeon: Abbie Sons, MD;  Location: [facility] ORS;  Service: Urology;  Laterality: Right;  right Stent exchange   ESOPHAGOGASTRODUODENOSCOPY (EGD) WITH PROPOFOL N/A 09/16/2018   Procedure: ESOPHAGOGASTRODUODENOSCOPY (EGD) WITH PROPOFOL;  Surgeon: Lin Landsman, MD;  Location: [place];  Service: Gastroenterology;  Laterality: N/A;   EYE SURGERY       Home Meds: Prior to Admission medications   Medication Sig Start Date End Date Taking? Authorizing Provider  albuterol (VENTOLIN HFA) 108 (90 Base) MCG/ACT inhaler Inhale 2 puffs into the lungs every 6 (six) hours as needed for wheezing or shortness of breath.    Yes [provider]  allopurinol (ZYLOPRIM) 100 MG tablet Take 1 tablet by mouth daily.   Yes [provider]  apixaban (ELIQUIS) 5 MG TABS tablet Take 1 tablet by mouth twice daily 10/25/20  Yes [name], MD  atorvastatin (LIPITOR) 10 MG tablet Take 1 tablet by mouth daily. 12/17/20  Yes [provider]  esomeprazole (NEXIUM) 40 MG capsule Take 40 mg by mouth daily. 12/17/18  Yes [provider]  fluticasone (FLONASE) 50 MCG/ACT nasal spray Place 2 sprays into both nostrils daily. 11/15/18  Yes [provider]  Fluticasone-Umeclidin-Vilant 100-62.5-25 MCG/INH AEPB Inhale 1 puff into the lungs daily. 12/25/17  Yes [provider]  guaiFENesin (MUCINEX) 600 MG 12 hr tablet Take 1 tablet (600 mg total) by mouth 2 (two) times daily as needed for up to 5 days for to loosen phlegm or cough. 02/05/21 02/10/21 Yes Mercy Riding, MD  hydrochlorothiazide (HYDRODIURIL) 25 MG tablet Take 25 mg by mouth daily. 08/28/20  Yes [provider]  insulin glargine (LANTUS SOLOSTAR) 100 UNIT/ML Solostar Pen Inject 38 Units into the skin daily. 11/09/20  Yes Danford, Suann Larry, MD  insulin lispro (HUMALOG) 100 UNIT/ML KwikPen Inject 10 Units into the skin in the morning, at noon, and at bedtime. 01/22/21  Yes [provider]  ipratropium (ATROVENT) 0.06 % nasal spray Place 2 sprays into both nostrils 4 (four) times daily. 05/28/20  Yes Margarette Canada, NP  ipratropium-albuterol (DUONEB) 0.5-2.5 (3) MG/3ML SOLN Take 3 mLs by nebulization 4 (four) times daily as needed. 10/20/20  Yes [provider]  losartan (COZAAR) 50 MG tablet Take 50 mg by mouth daily. 01/16/21  Yes [provider]  melatonin 5 MG TABS Take 5 mg by mouth at bedtime. 11/10/20  Yes [provider]  metFORMIN (GLUCOPHAGE) 500 MG tablet Take 1,000 mg by mouth 2 (two) times daily.   Yes [provider]  montelukast (SINGULAIR) 10 MG tablet Take 10 mg by mouth at bedtime.   Yes [provider]  senna-docusate (SENOKOT-S) 8.6-50 MG tablet Take 1 tablet by mouth 2 (two) times daily between meals as needed for mild constipation. 02/05/21  Yes Mercy Riding, MD  vitamin B-12 (CYANOCOBALAMIN) 500 MCG tablet Take 500 mcg by mouth daily.   Yes [provider]  VITAMIN D, CHOLECALCIFEROL, PO Take 1 tablet by mouth daily.   Yes [provider]  acetaminophen (TYLENOL) 500 MG tablet Take 1-2 tablets (500-1,000 mg total) by mouth every 6 (six) hours as needed for mild pain, fever, moderate pain or headache. Do not take more than 4 grams a day 02/05/21   Mercy Riding, MD  benzonatate (TESSALON) 100 MG capsule  Take 2 capsules (200 mg total) by mouth every 8 (eight) hours. Patient not taking: No sig reported 07/14/20   Margarette Canada, NP  cephALEXin (KEFLEX) 500 MG capsule Take 1 capsule (500 mg total) by mouth every 8 (eight) hours for 3 days. 02/05/21 02/08/21  Mercy Riding, MD  Dulaglutide 3 MG/0.5ML SOPN Inject 3 mg into the skin every Wednesday. 09/30/20   [provider]  Ferrous Sulfate (IRON) 325 (65 Fe) MG TABS Take 1 tablet (325 mg total) by mouth in the morning and at bedtime. 02/05/21   Mercy Riding, MD  furosemide (LASIX) 40 MG tablet Take 1 tablet (40 mg total) by mouth daily. May take additional dose in the afternoon if more swelling, SOB or about 2 lbs weight gain 02/05/21 08/04/21  Mercy Riding, MD  guaiFENesin-dextromethorphan (ROBITUSSIN DM) 100-10 MG/5ML syrup Take 10 mLs by mouth every 4 (four) hours as needed for cough. Patient not taking: Reported on 02/01/2021 11/09/20   Edwin Dada, MD  metFORMIN (GLUCOPHAGE) 500 MG tablet Take 1 tablet by mouth in the morning and at bedtime. Patient not taking: Reported on 02/01/2021 01/20/21 04/20/21  [provider]  oxyCODONE (OXY IR/ROXICODONE) 5 MG immediate release tablet Take 1 tablet (5 mg total) by mouth every 4 (four) hours as needed for severe pain. Patient not taking: Reported on 02/01/2021 11/09/20   Edwin Dada, MD  promethazine-dextromethorphan (PROMETHAZINE-DM) 6.25-15 MG/5ML syrup Take 5 mLs by mouth 4 (four) times daily as needed. Patient not taking: No sig reported 07/14/20   Margarette Canada, NP    Inpatient Medications: Scheduled Meds:  allopurinol  100 mg Oral Daily   apixaban  5 mg Oral BID   atorvastatin  10 mg Oral Daily   bethanechol  10 mg Oral TID   cephALEXin  500 mg Oral Q8H   ferrous sulfate  325 mg Oral Daily   fluticasone  2 spray Each Nare Daily   fluticasone furoate-vilanterol  1 puff Inhalation Daily   furosemide  40 mg Intravenous Daily   insulin aspart  0-20 Units Subcutaneous  TID WC   insulin aspart  0-5 Units Subcutaneous QHS   insulin aspart  4 Units Subcutaneous TID WC   insulin glargine-yfgn  30 Units Subcutaneous Daily   montelukast  10 mg Oral QHS   pantoprazole  40 mg Oral Daily   traZODone  50 mg Oral QHS   umeclidinium bromide  1 puff Inhalation Daily   vitamin B-12  500 mcg Oral Daily   Continuous Infusions:  PRN Meds: acetaminophen, alum & mag hydroxide-simeth, diphenhydrAMINE, guaiFENesin-dextromethorphan, ipratropium-albuterol, ondansetron **OR** ondansetron (ZOFRAN) IV  Allergies:   Allergies  Allergen Reactions   Ace Inhibitors Hives   Beta Adrenergic Blockers     Junctional bradycardia   Gabapentin Hives   Lisinopril Hives   Lyrica [Pregabalin] Hives   Shrimp [Shellfish Allergy] Swelling    Swelling of the lips    Social History:   Social History   Socioeconomic History   Marital status: Married    Spouse name: Not on file   Number of children: Not on file   Years of education: Not on file   Highest education level: Not on file  Occupational History   Not on file  Tobacco Use   Smoking status: Former    Packs/day: 1.00    Years: 20.00    Pack years: 20.00    Types: Cigarettes    Quit date: 12/04/1992    Years since quitting: 28.1   Smokeless tobacco: Never  Vaping Use   Vaping Use: Never used  Substance and Sexual Activity   Alcohol use: No   Drug use: No   Sexual activity: Not Currently  Other Topics Concern   Not on file  Social History Narrative   Not on file   Social Determinants of Health   Financial Resource Strain: Not on file  Food Insecurity: Not on file  Transportation Needs: Not on file  Physical Activity: Not on file  Stress: Not on file  Social Connections: Not on file  Intimate Partner Violence: Not on file     Family History:   Family History  Problem Relation Age of Onset   Other Mother        unknown medical history   Other Father        unknown medical history    ROS:  Review  of Systems  Constitutional:  Positive for malaise/fatigue. Negative for chills, diaphoresis, fever and weight loss.  HENT:  Negative for congestion.   Eyes:  Negative for discharge and redness.  Respiratory:  Positive for shortness of breath and wheezing. Negative for cough and sputum production.   Cardiovascular:  Positive for leg swelling. Negative for chest pain, palpitations, orthopnea, claudication and PND.  Gastrointestinal:  Negative for abdominal pain, blood in stool, heartburn, melena, nausea and vomiting.  Musculoskeletal:  Negative for falls and myalgias.  Skin:  Negative for rash.  Neurological:  Positive for weakness. Negative for dizziness, tingling, tremors, sensory change, speech change, focal weakness and loss of consciousness.  Endo/Heme/Allergies:  Does not bruise/bleed easily.  Psychiatric/Behavioral:  Negative for substance abuse. The patient is not nervous/anxious.   All other systems reviewed and are negative.    Physical Exam/Data:   Vitals:   02/06/21 2042 02/07/21 0038 02/07/21 0435 02/07/21 0803  BP: (!) 159/75 (!) 139/103 (!) 148/62 (!) 146/57  Pulse: 94 98 96 91  Resp: 20 20 20 20   Temp: 98.1 F (36.7 C) 97.7 F (36.5 C) 97.7 F (36.5 C) 98.1 F (36.7 C)  TempSrc:      SpO2: 92% 96% 92% 90%  Weight:      Height:        Intake/Output Summary (Last 24 hours) at 02/07/2021 0919 Last data filed at 02/07/2021 0440 Gross per 24 hour  Intake 1490 ml  Output 1650 ml  Net -160 ml   Filed Weights   02/03/21 0338 02/05/21 0444 02/06/21 0500  Weight: 105.1 kg 102.8 kg 103.2 kg   Body mass index is 40.3 kg/m.   Physical Exam: General: Well developed, well nourished, in no acute distress. Head: Normocephalic, atraumatic, sclera non-icteric, no xanthomas, nares without discharge.  Neck: Negative for carotid bruits. JVD difficult to assess secondary to body habitus. Lungs: Diminished breath sounds along the bilateral bases with rales notes. Breathing is  unlabored. Supplemental oxygen via nasal cannula at 6 L.  Heart: RRR with S1 S2. I/VI systolic murmur RUSB, no rubs, or gallops appreciated. Abdomen: Soft, non-tender, non-distended with normoactive bowel sounds. No hepatomegaly. No rebound/guarding. No obvious abdominal masses. Msk:  Strength and tone appear normal for age. Extremities: No clubbing or cyanosis. No edema. Distal pedal pulses are 2+ and equal bilaterally. Neuro: Alert and oriented X 3. No facial asymmetry. No focal deficit. Moves all extremities spontaneously. Psych:  Responds to questions appropriately with a normal affect.   EKG:  The EKG was personally reviewed and demonstrates: Junctional rhythm vs Afib with slow ventricular response, 58 bpm, nonspecific st/t changes  Telemetry:  Telemetry was personally reviewed and demonstrates: SR with occasional PACs and rare PVCs  Weights: Filed Weights   02/03/21 0338 02/05/21 0444 02/06/21 0500  Weight: 105.1 kg 102.8 kg 103.2 kg    Relevant CV Studies:  2D echo 02/06/2021: 1. Left ventricular ejection fraction, by estimation, is 60 to 65%. The  left ventricle has normal function. The left ventricle has no regional  wall motion abnormalities. Left ventricular diastolic parameters are  consistent with Grade I diastolic  dysfunction (impaired relaxation).   2. Right ventricular systolic function is mildly reduced. The right  ventricular size is mildly enlarged. There is severely elevated pulmonary  artery systolic pressure. The estimated right ventricular systolic  pressure is 57.0 mmHg.   3. The mitral valve is normal in structure. Mild to moderate mitral valve  regurgitation. No evidence of mitral stenosis.   4. Tricuspid valve regurgitation is moderate to severe.   5. The aortic valve is normal in structure. Aortic  valve regurgitation is  not visualized. Mild aortic valve stenosis. Aortic valve mean gradient  measures 17.0 mmHg. Aortic valve Vmax measures 2.71 m/s.   6.  The inferior vena cava is dilated in size with >50% respiratory  variability, suggesting right atrial pressure of 8 mmHg. __________  2D echo 06/2020: 1. Left ventricular ejection fraction, by estimation, is 60 to 65%. The  left ventricle has normal function. There is mild left ventricular  hypertrophy.   2. Right ventricular systolic function is normal. The right ventricular  size is normal.   3. Trivial mitral valve regurgitation.   4. AV is thickened, calcified Difficult to see well Peak and mean  gradients through the vlave are 30 and 15 mm Hg respectively AVA (VTI) is  1.03 cm2. Dimensionless index is 0.44. All consistent with mild to  moderate AS. Compared to echo report from September 28, 2019 no significant change . Aortic valve regurgitation is not  visualized.   5. The inferior vena cava is normal in size with greater than 50%  respiratory variability, suggesting right atrial pressure of 3 mmHg. __________  2D echo 09/2019: 1. Left ventricular ejection fraction, by estimation, is 55 to 60%. The  left ventricle has normal function. The left ventricle has no regional  wall motion abnormalities. There is mild left ventricular hypertrophy.  Left ventricular diastolic parameters  are consistent with Grade I diastolic dysfunction (impaired relaxation).   2. Right ventricular systolic function is normal. The right ventricular  size is normal. There is severely elevated pulmonary artery systolic  pressure. The estimated right ventricular systolic pressure is 26.9 mmHg.   3. Left atrial size was mildly dilated.   4. The mitral valve is normal in structure. Trivial mitral valve  regurgitation. No evidence of mitral stenosis.   5. The aortic valve is abnormal. Aortic valve regurgitation is not  visualized. Moderate aortic valve stenosis. Aortic valve area, by VTI  measures 0.94 cm. Aortic valve mean gradient measures 17.3 mmHg. __________  Elwyn Reach patch 02/2019: Normal sinus rhythm with  paroxysmal atrial fibrillation   avg HR of 89 bpm.  1 run of Ventricular Tachycardia occurred lasting 4 beats with a max rate of 179 bpm (avg 161 bpm).    Atrial Fibrillation occurred (<1% burden), ranging from 75-179 bpm (avg of 117 bpm), the longest lasting 32 mins 50 secs with an avg rate of 119 bpm.    Isolated SVEs were occasional (1.4%, 25270), SVE Couplets were rare (<1.0%, 2710), and SVE Triplets were rare (<1.0%, 299). Isolated VEs were rare (<1.0%, 617), VE Couplets were rare (<1.0%, 4), and VE Triplets were rare (<1.0%, 1). Ventricular Bigeminy was present.  __________  2D echo 02/2019: 1. Left ventricular ejection fraction, by visual estimation, is 65 to  70%. The left ventricle has normal function. Left ventricular septal wall  thickness was normal. Normal left ventricular posterior wall thickness.  There is no left ventricular  hypertrophy.   2. The left ventricle has no regional wall motion abnormalities.   3. Global right ventricle has normal systolic function.The right  ventricular size is normal. No increase in right ventricular wall  thickness.   4. Left atrial size was normal.   5. Right atrial size was normal.   6. The mitral valve is grossly normal. Trivial mitral valve  regurgitation.   7. The tricuspid valve is grossly normal. Tricuspid valve regurgitation  is trivial.   8. The aortic valve was not well visualized. Aortic valve regurgitation  is not visualized.   9. The pulmonic valve was not well visualized. Pulmonic valve  regurgitation is trivial.  10. The aortic root was not well visualized.  11. Moderately elevated pulmonary artery systolic pressure.  12. The atrial septum is grossly normal. __________  2D echo 08/2018: 1. The left ventricle has low normal systolic function, with an ejection  fraction of 50-55%. The cavity size was normal. There is mild concentric  left ventricular hypertrophy. Left ventricular diastolic Doppler  parameters are  indeterminate.   2. The right ventricle has low normal systolic function. The cavity was  mildly enlarged. There is no increase in right ventricular wall thickness.  Right ventricular systolic pressure is moderately elevated with an  estimated pressure of 60.4 mmHg.   3. Left atrial size was mildly dilated.   4. Right atrial size was mildly dilated.   5. The mitral valve is grossly normal.   6. The tricuspid valve is grossly normal. Tricuspid valve regurgitation  is mild-moderate.   7. The aortic valve is grossly normal. Mild thickening of the aortic  valve. Mild calcification of the aortic valve. Aortic valve regurgitation  was not assessed by color flow Doppler. Mild stenosis of the aortic valve.   8. The inferior vena cava was dilated in size with <50% respiratory  variability. __________  2D echo 06/2018:  1. The left ventricle has low normal systolic function, with an ejection  fraction of 50-55%. The cavity size was normal. Left ventricular diastolic  parameters were normal.   2. The right ventricle has normal systolic function. The cavity was  normal. There is no increase in right ventricular wall thickness.   3. Mild thickening of the mitral valve leaflet.   4. The aortic valve is tricuspid. Moderate thickening of the aortic  valve. Mild calcification of the aortic valve. Aortic valve regurgitation  is trivial by color flow Doppler. Mild stenosis of the aortic valve.    Laboratory Data:  Chemistry Recent Labs  Lab 02/05/21 0531 02/06/21 1057 02/07/21 0448  NA 136 136 137  K 3.8 3.8 3.9  CL 94* 92* 91*  CO2 33* 38* 37*  GLUCOSE 155* 202* 189*  BUN 18 14 15   CREATININE 0.66 0.70 0.78  CALCIUM 8.6* 8.9 8.8*  GFRNONAA >60 >60 >60  ANIONGAP 9 6 9     Recent Labs  Lab 02/05/21 0531 02/06/21 1057 02/07/21 0448  PROT  --  6.1*  --   ALBUMIN 3.0* 3.0* 3.1*  AST  --  12*  --   ALT  --  9  --   ALKPHOS  --  75  --   BILITOT  --  0.8  --    Hematology Recent  Labs  Lab 02/05/21 0531 02/06/21 1057 02/07/21 0448  WBC 9.8 9.2 10.7*  RBC 3.57* 3.63* 3.66*  HGB 9.2* 9.2* 9.3*  HCT 29.8* 30.0* 30.2*  MCV 83.5 82.6 82.5  MCH 25.8* 25.3* 25.4*  MCHC 30.9 30.7 30.8  RDW 17.4* 17.6* 17.5*  PLT 267 280 273   Cardiac EnzymesNo results for input(s): TROPONINI in the last 168 hours. No results for input(s): TROPIPOC in the last 168 hours.  BNP Recent Labs  Lab 02/05/21 0531 02/06/21 1057 02/07/21 0448  BNP 302.3* 236.0* 231.6*    DDimer No results for input(s): DDIMER in the last 168 hours.  Radiology/Studies:  CT Angio Chest Pulmonary Embolism (PE) W or WO Contrast  Result Date: 02/05/2021 IMPRESSION: 1. Negative examination for pulmonary embolism. 2. Moderate  right, small left pleural effusions and associated atelectasis or consolidation, slightly increased compared to prior examination. 3. Cardiomegaly and coronary artery disease. Aortic Atherosclerosis (ICD10-I70.0). Electronically Signed   By: Delanna Ahmadi M.D.   On: 02/05/2021 17:26     Assessment and Plan:   1. Acute on chronic hypoxic respiratory failure: -Likely in the setting of acute on chronic HFpEF, severe pulmonary hypertension, and right-sided pleural effusion with underlying COPD with poor pulmonary reserve, aortic stenosis, anemia, obesity, and physical deconditioning  -Wean supplemental oxygen back to baseline as tolerated  -Planning for right-sided thoracentesis as below  2. Acute on chronic HFpEF/severe pulmonary hypertension/pleural effusion: -Ultrasound-guided thoracentesis today with final report pending -Continue to hold HCTZ -IV Lasix 40 mg daily -Daily weights -Strict I/O  3. PAF: -Maintaining sinus rhythm -CHADS2VASc 6 -Eliquis  4. Aortic stenosis: -Mild by echo this admission -Outpatient follow up  5. Mitral/tricuspid regurgitation: -Diuresis as above  6. Junctional bradycardia: -Previously noted on beta blocker and calcium channel blocker -No  evidence of recurrence on telemetry   7. AKI: -Improved  8. HTN: -Blood pressure stable  9. HLD: -PTA atorvastatin   10. COPD: -Per primary service         For questions or updates, please contact [facility] Please consult [email or web address] for contact info under Cardiology/STEMI.   Signed, Christell Faith, PA-C [facility] Pager: [phone] 02/07/2021, 9:19 AM

## 2021-02-07 NOTE — Procedures (Signed)
PROCEDURE SUMMARY:  Successful US guided right thoracentesis. Yielded 600 mL of clear yellow fluid. Pt tolerated procedure well. No immediate complications.  Specimen was sent for labs. CXR ordered.  EBL < 5 mL  Hedy Jacob PA-C 02/07/2021 11:29 AM

## 2021-02-08 DIAGNOSIS — I5031 Acute diastolic (congestive) heart failure: Secondary | ICD-10-CM | POA: Diagnosis not present

## 2021-02-08 DIAGNOSIS — I272 Pulmonary hypertension, unspecified: Secondary | ICD-10-CM | POA: Diagnosis not present

## 2021-02-08 DIAGNOSIS — I5033 Acute on chronic diastolic (congestive) heart failure: Secondary | ICD-10-CM

## 2021-02-08 LAB — GLUCOSE, CAPILLARY
Glucose-Capillary: 181 mg/dL — ABNORMAL HIGH (ref 70–99)
Glucose-Capillary: 239 mg/dL — ABNORMAL HIGH (ref 70–99)
Glucose-Capillary: 234 mg/dL — ABNORMAL HIGH (ref 70–99)
Glucose-Capillary: 210 mg/dL — ABNORMAL HIGH (ref 70–99)

## 2021-02-08 LAB — CBC
HCT: 29.8 % — ABNORMAL LOW (ref 36.0–46.0)
Hemoglobin: 8.8 g/dL — ABNORMAL LOW (ref 12.0–15.0)
MCH: 24.8 pg — ABNORMAL LOW (ref 26.0–34.0)
MCHC: 29.5 g/dL — ABNORMAL LOW (ref 30.0–36.0)
MCV: 83.9 fL (ref 80.0–100.0)
Platelets: 255 10*3/uL (ref 150–400)
RBC: 3.55 MIL/uL — ABNORMAL LOW (ref 3.87–5.11)
RDW: 17.8 % — ABNORMAL HIGH (ref 11.5–15.5)
WBC: 10 10*3/uL (ref 4.0–10.5)
nRBC: 0 % (ref 0.0–0.2)

## 2021-02-08 LAB — BASIC METABOLIC PANEL
Anion gap: 6 (ref 5–15)
BUN: 17 mg/dL (ref 8–23)
CO2: 39 mmol/L — ABNORMAL HIGH (ref 22–32)
Calcium: 8.5 mg/dL — ABNORMAL LOW (ref 8.9–10.3)
Chloride: 90 mmol/L — ABNORMAL LOW (ref 98–111)
Creatinine, Ser: 0.7 mg/dL (ref 0.44–1.00)
GFR, Estimated: >60 mL/min (ref >60)
Glucose, Bld: 178 mg/dL — ABNORMAL HIGH (ref 70–99)
Potassium: 3.7 mmol/L (ref 3.5–5.1)
Sodium: 135 mmol/L (ref 135–145)

## 2021-02-08 LAB — PROTEIN, BODY FLUID (OTHER): Total Protein, Body Fluid Other: 2.7 g/dL

## 2021-02-08 LAB — D-DIMER, QUANTITATIVE: D-Dimer, Quant: 0.77 ug/mL-FEU — ABNORMAL HIGH (ref 0.00–0.50)

## 2021-02-08 LAB — CYTOLOGY - NON PAP

## 2021-02-08 LAB — MAGNESIUM: Magnesium: 1.8 mg/dL (ref 1.7–2.4)

## 2021-02-08 MED ORDER — OXYCODONE HCL 5 MG PO TABS: 5.0000 mg | ORAL_TABLET | Freq: Four times a day (QID) | ORAL | Status: DC | PRN

## 2021-02-08 NOTE — Progress Notes (Signed)
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 fib, DM-2 and morbid obesity presenting with decreased urine output, SOB, DOE, orthopnea, edema, chest pressure, abdominal pain, distention and diarrhea, and admitted with working diagnosis of acute diastolic CHF.    PT Comments    Pt tolerated treatment well today. Able to improve overall assist levels, activity tolerance, ambulation distance, O2 demand, and balance from last session. Pt required supervision for safety with bed mobility, min assist for transfers, and CGA for safety with limited gait in RW. Gait overall limited secondary to increased bil knee pain from chronic arthritis. Multiple STS performed from EOB to assist nursing with standing weight measurement. Pt able to maintain SpO2 >/=90% throughout session, with exception of x1 desat to 86% after ambulating 76ft; pt able to recover >90% with seated rest break and cues for PLB. Despite progress, pt continues to be limited with meeting goals due to increased O2 demand, decreased activity tolerance, decreased balance, functional weakness, and increased pain levels. Pt will continue to benefit from skilled acute PT services to address deficits for return to baseline function. Will continue to recommend SNF at [facility].   Recommendations for follow up therapy are one component of a multi-disciplinary discharge planning process, led by the attending physician.  Recommendations may be updated based on patient status, additional functional criteria and insurance authorization.  Follow Up Recommendations  Skilled nursing-short term rehab (<3 hours/day)     Assistance Recommended at Discharge Intermittent Supervision/Assistance  Equipment Recommendations   (defer to post acute)        Precautions / Restrictions Precautions Precautions: Fall Restrictions Weight Bearing Restrictions: No     Mobility  Bed Mobility Overal bed mobility: Needs Assistance Bed Mobility: Supine to Sit     Supine to sit: Supervision;HOB elevated     General bed mobility comments: increased time/effort and reliance of UE on bedrail    Transfers Overall transfer level: Needs assistance Equipment used: Rolling walker (2 wheels) Transfers: Sit to/from Stand Sit to Stand: Min assist           General transfer comment: for multiple STS from EOB with RW; verbal cues for safety and hand placement    Ambulation/Gait Ambulation/Gait assistance: Min guard Gait Distance (Feet): 10 Feet Assistive device: Rolling walker (2 wheels)         General Gait Details: slowed cadence, decreased step length/foot clearance bil, mild forward flexed posture, decreased RW proximity, labored breathing. Desat to 86% on 6L post gait, able to recover with seated rest break and cues for PLB.      Balance Overall balance assessment: Needs assistance Sitting-balance support: Feet supported Sitting balance-Leahy Scale: Good     Standing balance support: During functional activity;Bilateral upper extremity supported;Reliant on assistive device for balance Standing balance-Leahy Scale: Fair                              Cognition Arousal/Alertness: Awake/alert Behavior During Therapy: WFL for tasks assessed/performed;Flat affect Overall Cognitive Status: Within Functional Limits for tasks assessed  Exercises Other Exercises Other Exercises: Participated in bed mobility, transfers, and limited gait with RW. Performed bil ankle pumps, LAQ, and isometric hip ADD in sitting x10. Other Exercises: Pt educated re: PT role/POC, DC recommendations, safety with mobility, PLB.    General Comments General comments (skin integrity, edema,  etc.): SpO2 >/=90% throughout session, with desat to 86% post gait (3ft); able to recover >90% with seated rest break and cues for PLB      Pertinent Vitals/Pain Pain Assessment: 0-10 Pain Score: 9  Pain Location: bil knees (arthritis) Pain Descriptors / Indicators: Aching;Discomfort Pain Intervention(s): Limited activity within patient's tolerance;Monitored during session;Repositioned           PT Goals (current goals can now be found in the care plan section) Acute Rehab PT Goals Patient Stated Goal: [facility] at [facility] PT Goal Formulation: With patient Time For Goal Achievement: 02/16/21 Potential to Achieve Goals: Good Progress towards PT goals: Progressing toward goals    Frequency    Min 2X/week      PT Plan Current plan remains appropriate       AM-PAC PT "6 Clicks" Mobility   Outcome Measure  Help needed turning from your back to your side while in a flat bed without using bedrails?: A Little Help needed moving from lying on your back to sitting on the side of a flat bed without using bedrails?: A Little Help needed moving to and from a bed to a chair (including a wheelchair)?: A Little Help needed standing up from a chair using your arms (e.g., wheelchair or bedside chair)?: A Little Help needed to walk in hospital room?: A Little Help needed climbing 3-5 steps with a railing? : A Lot 6 Click Score: 17    End of Session Equipment Utilized During Treatment: Gait belt;Oxygen (6L) Activity Tolerance: Patient limited by fatigue Patient left: in chair;with call bell/phone within reach;with chair alarm set (left in care of OT) Nurse Communication: Mobility status PT Visit Diagnosis: Muscle weakness (generalized) (M62.81);Unsteadiness on feet (R26.81);Difficulty in walking, not elsewhere classified (R26.2)     Time: 1007-1030 PT Time Calculation (min) (ACUTE ONLY): 23 min  Charges:  $Therapeutic Exercise: 8-22 mins $Therapeutic Activity: 8-22 mins                        Herminio Commons, PT, DPT 11:58 AM,02/08/21

## 2021-02-08 NOTE — Progress Notes (Signed)
PROGRESS NOTE    Karen [name]  PJA:[id number] DOB: [birth year] DOA: 02/01/2021 PCP: Earlie Counts, FNP    Chief Complaint  Patient presents with   Shortness of Breath    Brief Narrative:  75 year old F with PMH of COPD, chronic hypoxic RF on 3 L, diastolic CHF, moderate aortic stenosis, A. fib, DM-2 and morbid obesity presenting with decreased urine output, SOB, DOE, orthopnea, edema, chest pressure, abdominal pain, distention and diarrhea, and admitted with working diagnosis of acute diastolic CHF.  BNP elevated to 935.  CXR with cardiomegaly.  Foley catheter placed in ED.  Patient received IV Lasix in ER.  CT chest ordered on admission, and showed improved pleural effusion, cardiomegaly and probable liver cirrhosis.    Patient also had encephalopathy the next day.  UA concerning for UTI.  Urine culture with E. coli and Klebsiella pneumonia. Completed antibiotics 02/08/21.  Patient was improving clinically was to be discharged however on day of discharge, had O2 needs increasing up to 6 L/min, therefore discharge was canceled.  2D echo obtained, CT chest obtained with increasing moderate right-sided pleural effusion, ultrasound-guided thoracentesis ordered.  Cardiology consulted due to abnormal 2D echo to be seen on 02/07/2021.  Undergoing further IV diuresis.    Assessment & Plan:   Principal Problem:   Acute diastolic CHF (congestive heart failure) (HCC) Active Problems:   COPD (chronic obstructive pulmonary disease) (HCC)   Hypertension   CKD (chronic kidney disease), stage IIIa   Morbid obesity (HCC)   Aortic stenosis, moderate   AF (paroxysmal atrial fibrillation) (HCC)   Pleural effusion   Acute lower UTI   Acute metabolic encephalopathy   Acute kidney injury superimposed on CKD ([place])  1. Acute hypoxic respiratory distress/acute on chronic diastolic CHF/moderate pleural effusion -Likely multifactorial secondary to acute on chronic diastolic CHF, increasing moderate  right-sided pleural effusion, abnormal 2D echo with mild to moderate MVR, moderate to severe TVR, severely elevated pulmonary artery systolic pressure. -Patient initially was to be discharged 02/05/2021 after diuresis with IV Lasix, however patient noted to desat requiring 6 L nasal cannula and as such discharge canceled. -CT angiogram chest done negative for PE, moderate right, small left pleural effusion associated atelectasis or consolidation slightly increased compared to prior examination, cardiomegaly and CAD. -Echo as below - valvular dz and diastolic dysfunction --Continue IV Lasix  --R thoracentesis done on 12/5 with 600 cc removed --Cardiology consulted, appreciate input. --Strict I's and O's, daily weights.  2.  Acute on chronic diastolic CHF, Valvular heart dz -Concern likely secondary to severely elevated pulmonary arterial systolic pressure, mild to moderate MVR, moderate to severe TVR noted on 2D echo. Net IO Since Admission: -9,525 mL [02/08/21 2016] -2D echo obtained with EF of 60 to 65%, NWMA, grade 1 diastolic dysfunction, severely elevated pulmonary arterial systolic pressure, mild to moderate MVR, moderate to severe TVR, mild aortic valvular stenosis. -Cardiology managing -Continue IV Lasix, dosing per cardiology -Continue statin.   3.  Acute metabolic encephalopathy - resolved -Suspect due to UTI and electrolyte abnormalities -TSH, ammonia level, vitamin B12, RPR unremarkable. -Encephalopathy improved and patient likely at baseline. -Completed antibiotics for UTI  4.  E. coli UTI/Klebsiella UTI -IV Rocephin >> transitioned to PO Keflex, course completed  5.  AKI on CKD stage IIIA - AKI resolved with diuresis -Likely secondary to volume overload/acute diastolic CHF exacerbation. -Urinalysis done on admission concerning for UTI, negative proteinuria. -Monitor BMP  6.  Chronic hypoxic respiratory failure/chronic COPD -Patient with no wheezing noted  on examination.   Baseline O2 3 L nasal cannula. Now on 6>>6.5 L/min O2 -Continue Breo Ellipta, Flonase, Incruse, Singulair, PPI, nebs as needed.  7.  Normocytic anemia -H&H stable.  8.  Uncontrolled insulin-dependent diabetes type 2 with hyperglycemia and other complications -Hemoglobin A1c 9.8 (02/03/2021) -CBG 148 this morning. -Continue Semglee 30 units daily, increase mealtime NovoLog 4>>7 units 3 times daily, SSI. -Outpatient follow-up.  9.  Paroxysmal atrial fibrillation -In sinus rhythm. -Not on rate or rhythm control medication likely due to history of bradycardia. -continue Eliquis  10.  Hypertension -Blood pressure noted to be soft and as such ARB and HCTZ on hold. -On IV Lasix.  11.  Chronic back pain -Resume home oxycodone PRN now that encephalopathy resolved.  12.  Hypervolemic hyponatremia - resolved -In the setting of ARB and HCTZ. -HCTZ on hold, would not resume on discharge. -Continue to hold ARB with soft BP's -Improved with diuresis.  58.  Morbid obesity -Outpatient follow-up with PCP   DVT prophylaxis: Eliquis Code Status: DNR Family Communication: Updated patient.  No family at bedside. Disposition:   Status is: Inpatient  Remains inpatient appropriate because: Severity of illness on IV diuretics, O2 requirement above baseline.     Consultants:  Radiology Cardiology   Procedures:  CT chest 02/01/2021, CT angiogram chest 02/05/2021 Chest x-ray 02/02/2019 Abdominal ultrasound 02/02/2021 2D echo 02/06/2021 R thoracentesis 02/07/2021  Antimicrobials:  Keflex 02/03/2021>>>> 02/08/2021 IV Rocephin 02/02/2021>>>> 02/03/2021   Subjective: Patient reports breathing somewhat improved after thoracentesis yesterday.  Per PT yesterday, her O2 sat dropped with activity.  Reportedly this was better today.  Pt has no acute complaints at this time.    Objective: Vitals:   02/08/21 1141 02/08/21 1202 02/08/21 1525 02/08/21 1955  BP:  (!) 121/50 (!) 127/51 (!) 103/51   Pulse: 89 80 78 79  Resp:  18 18 20   Temp:  97.9 F (36.6 C) 97.6 F (36.4 C) 98.2 F (36.8 C)  TempSrc:      SpO2: 96% 96% 97% 98%  Weight:      Height:        Intake/Output Summary (Last 24 hours) at 02/08/2021 2016 Last data filed at 02/08/2021 1820 Gross per 24 hour  Intake 960 ml  Output 1225 ml  Net -265 ml   Filed Weights   02/05/21 0444 02/06/21 0500 02/08/21 1014  Weight: 102.8 kg 103.2 kg 98.3 kg    Examination:  General exam: awake, alert, no acute distress, obese Respiratory system: lungs clear with diminished bases, no wheezes, normal respiratory effort on 6.5 l/min [place] O2. Cardiovascular system: normal E4/M3, RRR, 2/6 systolic murmur, no lower extremity edema.   Central nervous system: A&O x3. no gross focal neurologic deficits, normal speech Extremities: moves all, no cyanosis, normal tone Psychiatry: normal mood, congruent affect, judgement and insight appear normal     Data Reviewed: I have personally reviewed following labs and imaging studies  CBC: Recent Labs  Lab 02/04/21 0612 02/05/21 0531 02/06/21 1057 02/07/21 0448 02/08/21 0318  WBC 9.7 9.8 9.2 10.7* 10.0  NEUTROABS  --   --  7.3 8.5*  --   HGB 8.8* 9.2* 9.2* 9.3* 8.8*  HCT 29.5* 29.8* 30.0* 30.2* 29.8*  MCV 82.6 83.5 82.6 82.5 83.9  PLT 285 267 280 273 536    Basic Metabolic Panel: Recent Labs  Lab 02/03/21 0414 02/04/21 0612 02/05/21 0531 02/06/21 1057 02/07/21 0448 02/08/21 0318  NA 132* 135 136 136 137 135  K 4.5 4.1 3.8  3.8 3.9 3.7  CL 98 99 94* 92* 91* 90*  CO2 30 32 33* 38* 37* 39*  GLUCOSE 194* 140* 155* 202* 189* 178*  BUN 42* 26* 18 14 15 17   CREATININE 1.14* 0.82 0.66 0.70 0.78 0.70  CALCIUM 8.5* 8.6* 8.6* 8.9 8.8* 8.5*  MG 2.0 1.7 2.0 1.8 2.0 1.8  PHOS 4.1 3.2 3.8  --  3.3  --     GFR: Estimated Creatinine Clearance: 66.9 mL/min (by C-G formula based on SCr of 0.7 mg/dL).  Liver Function Tests: Recent Labs  Lab 02/03/21 0414 02/04/21 0612  02/05/21 0531 02/06/21 1057 02/07/21 0448  AST  --   --   --  12*  --   ALT  --   --   --  9  --   ALKPHOS  --   --   --  75  --   BILITOT  --   --   --  0.8  --   PROT  --   --   --  6.1*  --   ALBUMIN 3.0* 3.1* 3.0* 3.0* 3.1*    CBG: Recent Labs  Lab 02/07/21 1606 02/07/21 2037 02/08/21 0820 02/08/21 1124 02/08/21 1617  GLUCAP 196* 230* 181* 239* 234*     Recent Results (from the past 240 hour(s))  Resp Panel by RT-PCR (Flu A&B, Covid) Nasopharyngeal Swab     Status: None   Collection Time: 02/01/21 12:56 PM   Specimen: Nasopharyngeal Swab; Nasopharyngeal(NP) swabs in vial transport medium  Result Value Ref Range Status   SARS Coronavirus 2 by RT PCR NEGATIVE NEGATIVE Final    Comment: (NOTE) SARS-CoV-2 target nucleic acids are NOT DETECTED.  The SARS-CoV-2 RNA is generally detectable in upper respiratory specimens during the acute phase of infection. The lowest concentration of SARS-CoV-2 viral copies this assay can detect is 138 copies/mL. A negative result does not preclude SARS-Cov-2 infection and should not be used as the sole basis for treatment or other patient management decisions. A negative result may occur with  improper specimen collection/handling, submission of specimen other than nasopharyngeal swab, presence of viral mutation(s) within the areas targeted by this assay, and inadequate number of viral copies(<138 copies/mL). A negative result must be combined with clinical observations, patient history, and epidemiological information. The expected result is Negative.  Fact Sheet for Patients:  EntrepreneurPulse.com.au  Fact Sheet for Healthcare Providers:  IncredibleEmployment.be  This test is no t yet approved or cleared by the [place] FDA and  has been authorized for detection and/or diagnosis of SARS-CoV-2 by FDA under an Emergency Use Authorization (EUA). This EUA will remain  in effect (meaning this  test can be used) for the duration of the COVID-19 declaration under Section 564(b)(1) of the Act, 21 U.S.C.section 360bbb-3(b)(1), unless the authorization is terminated  or revoked sooner.       Influenza A by PCR NEGATIVE NEGATIVE Final   Influenza B by PCR NEGATIVE NEGATIVE Final    Comment: (NOTE) The Xpert Xpress SARS-CoV-2/FLU/RSV plus assay is intended as an aid in the diagnosis of influenza from Nasopharyngeal swab specimens and should not be used as a sole basis for treatment. Nasal washings and aspirates are unacceptable for Xpert Xpress SARS-CoV-2/FLU/RSV testing.  Fact Sheet for Patients: EntrepreneurPulse.com.au  Fact Sheet for Healthcare Providers: IncredibleEmployment.be  This test is not yet approved or cleared by the [place] FDA and has been authorized for detection and/or diagnosis of SARS-CoV-2 by FDA under an Emergency Use Authorization (EUA).  This EUA will remain in effect (meaning this test can be used) for the duration of the COVID-19 declaration under Section 564(b)(1) of the Act, 21 U.S.C. section 360bbb-3(b)(1), unless the authorization is terminated or revoked.  Performed at [facility], [address] [place], [place] [address]   Urine Culture     Status: Abnormal   Collection Time: 02/01/21  1:42 PM   Specimen: Urine, Catheterized  Result Value Ref Range Status   Specimen Description   Final    URINE, CATHETERIZED Performed at [facility], [address] [place], [place] [address]    Special Requests   Final    NONE Performed at [facility], [address] [place], [place] [address]    Culture (A)  Final    >=100,000 COLONIES/mL ESCHERICHIA COLI 80,000 COLONIES/mL KLEBSIELLA PNEUMONIAE    Report Status 02/03/2021 FINAL  Final   Organism ID, Bacteria ESCHERICHIA COLI (A)  Final   Organism ID, Bacteria KLEBSIELLA PNEUMONIAE (A)  Final      Susceptibility    Escherichia coli - MIC*    AMPICILLIN 8 SENSITIVE Sensitive     CEFAZOLIN <=4 SENSITIVE Sensitive     CEFEPIME <=0.12 SENSITIVE Sensitive     CEFTRIAXONE <=0.25 SENSITIVE Sensitive     CIPROFLOXACIN <=0.25 SENSITIVE Sensitive     GENTAMICIN <=1 SENSITIVE Sensitive     IMIPENEM <=0.25 SENSITIVE Sensitive     NITROFURANTOIN <=16 SENSITIVE Sensitive     TRIMETH/SULFA <=20 SENSITIVE Sensitive     AMPICILLIN/SULBACTAM 4 SENSITIVE Sensitive     PIP/TAZO <=4 SENSITIVE Sensitive     * >=100,000 COLONIES/mL ESCHERICHIA COLI   Klebsiella pneumoniae - MIC*    AMPICILLIN >=32 RESISTANT Resistant     CEFAZOLIN <=4 SENSITIVE Sensitive     CEFEPIME <=0.12 SENSITIVE Sensitive     CEFTRIAXONE <=0.25 SENSITIVE Sensitive     CIPROFLOXACIN <=0.25 SENSITIVE Sensitive     GENTAMICIN <=1 SENSITIVE Sensitive     IMIPENEM <=0.25 SENSITIVE Sensitive     NITROFURANTOIN 64 INTERMEDIATE Intermediate     TRIMETH/SULFA <=20 SENSITIVE Sensitive     AMPICILLIN/SULBACTAM 4 SENSITIVE Sensitive     PIP/TAZO <=4 SENSITIVE Sensitive     * 80,000 COLONIES/mL KLEBSIELLA PNEUMONIAE  Resp Panel by RT-PCR (Flu A&B, Covid) Nasopharyngeal Swab     Status: None   Collection Time: 02/04/21  5:09 PM   Specimen: Nasopharyngeal Swab; Nasopharyngeal(NP) swabs in vial transport medium  Result Value Ref Range Status   SARS Coronavirus 2 by RT PCR NEGATIVE NEGATIVE Final    Comment: (NOTE) SARS-CoV-2 target nucleic acids are NOT DETECTED.  The SARS-CoV-2 RNA is generally detectable in upper respiratory specimens during the acute phase of infection. The lowest concentration of SARS-CoV-2 viral copies this assay can detect is 138 copies/mL. A negative result does not preclude SARS-Cov-2 infection and should not be used as the sole basis for treatment or other patient management decisions. A negative result may occur with  improper specimen collection/handling, submission of specimen other than nasopharyngeal swab, presence of  viral mutation(s) within the areas targeted by this assay, and inadequate number of viral copies(<138 copies/mL). A negative result must be combined with clinical observations, patient history, and epidemiological information. The expected result is Negative.  Fact Sheet for Patients:  EntrepreneurPulse.com.au  Fact Sheet for Healthcare Providers:  IncredibleEmployment.be  This test is no t yet approved or cleared by the [place] FDA and  has been authorized for detection and/or diagnosis of SARS-CoV-2 by  FDA under an Emergency Use Authorization (EUA). This EUA will remain  in effect (meaning this test can be used) for the duration of the COVID-19 declaration under Section 564(b)(1) of the Act, 21 U.S.C.section 360bbb-3(b)(1), unless the authorization is terminated  or revoked sooner.       Influenza A by PCR NEGATIVE NEGATIVE Final   Influenza B by PCR NEGATIVE NEGATIVE Final    Comment: (NOTE) The Xpert Xpress SARS-CoV-2/FLU/RSV plus assay is intended as an aid in the diagnosis of influenza from Nasopharyngeal swab specimens and should not be used as a sole basis for treatment. Nasal washings and aspirates are unacceptable for Xpert Xpress SARS-CoV-2/FLU/RSV testing.  Fact Sheet for Patients: EntrepreneurPulse.com.au  Fact Sheet for Healthcare Providers: IncredibleEmployment.be  This test is not yet approved or cleared by the [place] FDA and has been authorized for detection and/or diagnosis of SARS-CoV-2 by FDA under an Emergency Use Authorization (EUA). This EUA will remain in effect (meaning this test can be used) for the duration of the COVID-19 declaration under Section 564(b)(1) of the Act, 21 U.S.C. section 360bbb-3(b)(1), unless the authorization is terminated or revoked.  Performed at [facility], [place], [place] [address]   Body fluid culture w Gram  Stain     Status: None (Preliminary result)   Collection Time: 02/07/21 10:09 AM   Specimen: PATH Cytology Pleural fluid  Result Value Ref Range Status   Specimen Description PLEURAL  Final   Special Requests NONE  Final   Gram Stain   Final    FEW WBC PRESENT, PREDOMINANTLY MONONUCLEAR NO ORGANISMS SEEN    Culture   Final    NO GROWTH < 24 HOURS Performed at [facility] [address] [place],  [address]    Report Status PENDING  Incomplete         Radiology Studies: DG Chest Port 1 View  Result Date: 02/07/2021 CLINICAL DATA:  Pleural effusion, status post thoracentesis EXAM: PORTABLE CHEST 1 VIEW COMPARISON:  Previous studies including the CT done on 02-07-21 FINDINGS: Transverse diameter of heart is increased. Central pulmonary vessels are prominent. There is prominence of interstitial markings in the parahilar regions and lower lung fields. There is no new focal consolidation. There is blunting of both lateral CP angles. There is no pneumothorax. IMPRESSION: There is no pneumothorax. Small bilateral pleural effusions. Cardiomegaly. There is prominence of interstitial markings in the parahilar regions and lower lung fields suggesting mild interstitial edema or interstitial pneumonitis. Electronically Signed   By: Elmer Picker M.D.   On: 02/07/2021 10:40   IR THORACENTESIS ASP PLEURAL SPACE W/IMG GUIDE  Result Date: 02/07/2021 INDICATION: Shortness of breath with right pleural effusion request received for diagnostic and therapeutic thoracentesis. EXAM: ULTRASOUND GUIDED RIGHT THORACENTESIS MEDICATIONS: Local 1% lidocaine only. COMPLICATIONS: None immediate. PROCEDURE: An ultrasound guided thoracentesis was thoroughly discussed with the patient and questions answered. The benefits, risks, alternatives and complications were also discussed. The patient understands and wishes to proceed with the procedure. Written consent was obtained. Ultrasound was performed to  localize and mark an adequate pocket of fluid in the right chest. The area was then prepped and draped in the normal sterile fashion. 1% Lidocaine was used for local anesthesia. Under ultrasound guidance a 19 gauge, 7-cm, Yueh catheter was introduced. Thoracentesis was performed. The catheter was removed and a dressing applied. FINDINGS: A total of approximately 600 mL of clear yellow fluid was removed. Samples were sent to the laboratory as  requested by the clinical team. IMPRESSION: Successful ultrasound guided RIGHT thoracentesis yielding 600 mL of pleural fluid. Read By: Tsosie Billing PA-C Electronically Signed   By: Michaelle Birks M.D.   On: 02/07/2021 12:01        Scheduled Meds:  allopurinol  100 mg Oral Daily   apixaban  5 mg Oral BID   atorvastatin  10 mg Oral Daily   bethanechol  10 mg Oral TID   ferrous sulfate  325 mg Oral Daily   fluticasone  2 spray Each Nare Daily   fluticasone furoate-vilanterol  1 puff Inhalation Daily   furosemide  40 mg Intravenous BID   insulin aspart  0-20 Units Subcutaneous TID WC   insulin aspart  0-5 Units Subcutaneous QHS   insulin aspart  7 Units Subcutaneous TID WC   insulin glargine-yfgn  30 Units Subcutaneous Daily   montelukast  10 mg Oral QHS   pantoprazole  40 mg Oral Daily   traZODone  50 mg Oral QHS   umeclidinium bromide  1 puff Inhalation Daily   vitamin B-12  500 mcg Oral Daily   Continuous Infusions:   LOS: 7 days    Time spent: 25 minutes with > 50% spent at bedside and in coordination of care     Ezekiel Slocumb, DO [facility]   To contact the attending provider between 7A-7P or the covering provider during after hours 7P-7A, please log into the web site [email or web address] and access using universal [place] password for that web site. If you do not have the password, please call the hospital operator.  02/08/2021, 8:16 PM

## 2021-02-08 NOTE — Progress Notes (Signed)
Progress Note  Patient Name: Karen [name] Date of Encounter: 02/08/2021  Primary Cardiologist: Rockey Situ  Subjective   Breathing largely unchanged today when compared to yesterday.  No chest pain or palpitations.  She underwent successful ultrasound-guided thoracentesis yesterday with 600 cc of fluid removed.  Cytology pending.  Documented urine output 2.2 L for the past 24 hours on titrated dose of IV Lasix with a net -9.9 L for the admission.  She continues to require increased supplemental oxygen via nasal cannula at 6 L, baseline 3 L.  Inpatient Medications    Scheduled Meds:  allopurinol  100 mg Oral Daily   apixaban  5 mg Oral BID   atorvastatin  10 mg Oral Daily   bethanechol  10 mg Oral TID   ferrous sulfate  325 mg Oral Daily   fluticasone  2 spray Each Nare Daily   fluticasone furoate-vilanterol  1 puff Inhalation Daily   furosemide  40 mg Intravenous BID   insulin aspart  0-20 Units Subcutaneous TID WC   insulin aspart  0-5 Units Subcutaneous QHS   insulin aspart  7 Units Subcutaneous TID WC   insulin glargine-yfgn  30 Units Subcutaneous Daily   montelukast  10 mg Oral QHS   pantoprazole  40 mg Oral Daily   traZODone  50 mg Oral QHS   umeclidinium bromide  1 puff Inhalation Daily   vitamin B-12  500 mcg Oral Daily   Continuous Infusions:  PRN Meds: acetaminophen, alum & mag hydroxide-simeth, diphenhydrAMINE, guaiFENesin-dextromethorphan, ipratropium-albuterol, ondansetron **OR** ondansetron (ZOFRAN) IV   Vital Signs    Vitals:   02/08/21 0056 02/08/21 0400 02/08/21 0819 02/08/21 1014  BP: (!) 118/51 (!) 126/58 (!) 126/46   Pulse: 94 92 89   Resp: 16 20 18    Temp: 98.9 F (37.2 C) (!) 97.5 F (36.4 C) 97.8 F (36.6 C)   TempSrc: Oral Axillary    SpO2: 94% 95% 91%   Weight:    98.3 kg  Height:        Intake/Output Summary (Last 24 hours) at 02/08/2021 1019 Last data filed at 02/08/2021 1015 Gross per 24 hour  Intake 240 ml  Output 1925 ml  Net  -1685 ml   Filed Weights   02/05/21 0444 02/06/21 0500 02/08/21 1014  Weight: 102.8 kg 103.2 kg 98.3 kg    Telemetry    Sinus rhythm with PVCs - Personally Reviewed  ECG    No new tracings - Personally Reviewed  Physical Exam   GEN: No acute distress.   Neck: JVD is difficult to visualize secondary to body habitus. Cardiac: RRR, I/VI systolic murmur RUSB, no rubs, or gallops.  Respiratory: Diminished breath sounds along the bilateral bases bilaterally.  GI: Soft, nontender, non-distended.   MS: Trace to 1+ bilateral lower extremity edema; No deformity. Neuro:  Alert and oriented x 3; Nonfocal.  Psych: Normal affect.  Labs    Chemistry Recent Labs  Lab 02/05/21 0531 02/06/21 1057 02/07/21 0448 02/08/21 0318  NA 136 136 137 135  K 3.8 3.8 3.9 3.7  CL 94* 92* 91* 90*  CO2 33* 38* 37* 39*  GLUCOSE 155* 202* 189* 178*  BUN 18 14 15 17   CREATININE 0.66 0.70 0.78 0.70  CALCIUM 8.6* 8.9 8.8* 8.5*  PROT  --  6.1*  --   --   ALBUMIN 3.0* 3.0* 3.1*  --   AST  --  12*  --   --   ALT  --  9  --   --   ALKPHOS  --  75  --   --   BILITOT  --  0.8  --   --   GFRNONAA >60 >60 >60 >60  ANIONGAP 9 6 9 6      Hematology Recent Labs  Lab 02/06/21 1057 02/07/21 0448 02/08/21 0318  WBC 9.2 10.7* 10.0  RBC 3.63* 3.66* 3.55*  HGB 9.2* 9.3* 8.8*  HCT 30.0* 30.2* 29.8*  MCV 82.6 82.5 83.9  MCH 25.3* 25.4* 24.8*  MCHC 30.7 30.8 29.5*  RDW 17.6* 17.5* 17.8*  PLT 280 273 255    Cardiac EnzymesNo results for input(s): TROPONINI in the last 168 hours. No results for input(s): TROPIPOC in the last 168 hours.   BNP Recent Labs  Lab 02/05/21 0531 02/06/21 1057 02/07/21 0448  BNP 302.3* 236.0* 231.6*     DDimer  Recent Labs  Lab 02/08/21 0928  DDIMER 0.77*     Radiology    DG Chest Port 1 View  Result Date: 02/07/2021 IMPRESSION: There is no pneumothorax. Small bilateral pleural effusions. Cardiomegaly. There is prominence of interstitial markings in the  parahilar regions and lower lung fields suggesting mild interstitial edema or interstitial pneumonitis. Electronically Signed   By: Elmer Picker M.D.   On: 02/07/2021 10:40   IR THORACENTESIS ASP PLEURAL SPACE W/IMG GUIDE  Result Date: 02/07/2021 IMPRESSION: Successful ultrasound guided RIGHT thoracentesis yielding 600 mL of pleural fluid. Read By: Tsosie Billing PA-C Electronically Signed   By: Michaelle Birks M.D.   On: 02/07/2021 12:01    Cardiac Studies   2D echo 02/06/2021: 1. Left ventricular ejection fraction, by estimation, is 60 to 65%. The  left ventricle has normal function. The left ventricle has no regional  wall motion abnormalities. Left ventricular diastolic parameters are  consistent with Grade I diastolic  dysfunction (impaired relaxation).   2. Right ventricular systolic function is mildly reduced. The right  ventricular size is mildly enlarged. There is severely elevated pulmonary  artery systolic pressure. The estimated right ventricular systolic  pressure is 50.9 mmHg.   3. The mitral valve is normal in structure. Mild to moderate mitral valve  regurgitation. No evidence of mitral stenosis.   4. Tricuspid valve regurgitation is moderate to severe.   5. The aortic valve is normal in structure. Aortic valve regurgitation is  not visualized. Mild aortic valve stenosis. Aortic valve mean gradient  measures 17.0 mmHg. Aortic valve Vmax measures 2.71 m/s.   6. The inferior vena cava is dilated in size with >50% respiratory  variability, suggesting right atrial pressure of 8 mmHg. __________   2D echo 06/2020: 1. Left ventricular ejection fraction, by estimation, is 60 to 65%. The  left ventricle has normal function. There is mild left ventricular  hypertrophy.   2. Right ventricular systolic function is normal. The right ventricular  size is normal.   3. Trivial mitral valve regurgitation.   4. AV is thickened, calcified Difficult to see well Peak and mean   gradients through the vlave are 30 and 15 mm Hg respectively AVA (VTI) is  1.03 cm2. Dimensionless index is 0.44. All consistent with mild to  moderate AS. Compared to echo report from September 28, 2019 no significant change . Aortic valve regurgitation is not  visualized.   5. The inferior vena cava is normal in size with greater than 50%  respiratory variability, suggesting right atrial pressure of 3 mmHg. __________   2D echo 09/2019: 1. Left  ventricular ejection fraction, by estimation, is 55 to 60%. The  left ventricle has normal function. The left ventricle has no regional  wall motion abnormalities. There is mild left ventricular hypertrophy.  Left ventricular diastolic parameters  are consistent with Grade I diastolic dysfunction (impaired relaxation).   2. Right ventricular systolic function is normal. The right ventricular  size is normal. There is severely elevated pulmonary artery systolic  pressure. The estimated right ventricular systolic pressure is 70.2 mmHg.   3. Left atrial size was mildly dilated.   4. The mitral valve is normal in structure. Trivial mitral valve  regurgitation. No evidence of mitral stenosis.   5. The aortic valve is abnormal. Aortic valve regurgitation is not  visualized. Moderate aortic valve stenosis. Aortic valve area, by VTI  measures 0.94 cm. Aortic valve mean gradient measures 17.3 mmHg. __________   [facility] Reach patch 02/2019: Normal sinus rhythm with paroxysmal atrial fibrillation   avg HR of 89 bpm.  1 run of Ventricular Tachycardia occurred lasting 4 beats with a max rate of 179 bpm (avg 161 bpm).    Atrial Fibrillation occurred (<1% burden), ranging from 75-179 bpm (avg of 117 bpm), the longest lasting 32 mins 50 secs with an avg rate of 119 bpm.    Isolated SVEs were occasional (1.4%, 25270), SVE Couplets were rare (<1.0%, 2710), and SVE Triplets were rare (<1.0%, 299). Isolated VEs were rare (<1.0%, 617), VE Couplets were rare (<1.0%, 4),  and VE Triplets were rare (<1.0%, 1). Ventricular Bigeminy was present.  __________   2D echo 02/2019: 1. Left ventricular ejection fraction, by visual estimation, is 65 to  70%. The left ventricle has normal function. Left ventricular septal wall  thickness was normal. Normal left ventricular posterior wall thickness.  There is no left ventricular  hypertrophy.   2. The left ventricle has no regional wall motion abnormalities.   3. Global right ventricle has normal systolic function.The right  ventricular size is normal. No increase in right ventricular wall  thickness.   4. Left atrial size was normal.   5. Right atrial size was normal.   6. The mitral valve is grossly normal. Trivial mitral valve  regurgitation.   7. The tricuspid valve is grossly normal. Tricuspid valve regurgitation  is trivial.   8. The aortic valve was not well visualized. Aortic valve regurgitation  is not visualized.   9. The pulmonic valve was not well visualized. Pulmonic valve  regurgitation is trivial.  10. The aortic root was not well visualized.  11. Moderately elevated pulmonary artery systolic pressure.  12. The atrial septum is grossly normal. __________   2D echo 08/2018: 1. The left ventricle has low normal systolic function, with an ejection  fraction of 50-55%. The cavity size was normal. There is mild concentric  left ventricular hypertrophy. Left ventricular diastolic Doppler  parameters are indeterminate.   2. The right ventricle has low normal systolic function. The cavity was  mildly enlarged. There is no increase in right ventricular wall thickness.  Right ventricular systolic pressure is moderately elevated with an  estimated pressure of 60.4 mmHg.   3. Left atrial size was mildly dilated.   4. Right atrial size was mildly dilated.   5. The mitral valve is grossly normal.   6. The tricuspid valve is grossly normal. Tricuspid valve regurgitation  is mild-moderate.   7. The aortic  valve is grossly normal. Mild thickening of the aortic  valve. Mild calcification of the aortic valve. Aortic valve  regurgitation  was not assessed by color flow Doppler. Mild stenosis of the aortic valve.   8. The inferior vena cava was dilated in size with <50% respiratory  variability. __________   2D echo 06/2018:  1. The left ventricle has low normal systolic function, with an ejection  fraction of 50-55%. The cavity size was normal. Left ventricular diastolic  parameters were normal.   2. The right ventricle has normal systolic function. The cavity was  normal. There is no increase in right ventricular wall thickness.   3. Mild thickening of the mitral valve leaflet.   4. The aortic valve is tricuspid. Moderate thickening of the aortic  valve. Mild calcification of the aortic valve. Aortic valve regurgitation  is trivial by color flow Doppler. Mild stenosis of the aortic valve.  Patient Profile     75 y.o. female with history of HFpEF, severe pulmonary hypertension, moderate aortic stenosis, junctional bradycardia with BB/CCB, PAF by 03/2019 monitor, chronic chest pain, GI bleed, Covid in 10/2020, DM2, HTN, HLD, chronic hypoxic respiratory failure on supplemental oxygen, COPD, anemia, and obesity who is being seen today for the evaluation of acute on chronic HFpEF/pulmonary hypertension at the request of Dr. Grandville Silos.  Assessment & Plan    1. Acute on chronic hypoxic respiratory failure: -Likely in the setting of acute on chronic HFpEF, severe pulmonary hypertension, and right-sided pleural effusion with underlying COPD with poor pulmonary reserve, aortic stenosis, anemia, obesity, and physical deconditioning  -Wean supplemental oxygen back to baseline as tolerated  -Status post right-sided thoracentesis on 12/5 with 600 cc of fluid removed with cytology pending   2. Acute on chronic HFpEF/severe pulmonary hypertension/pleural effusion: -She remains volume up -Status post  thoracentesis as above -Continue IV Lasix 40 mg bid -Suspect she will require several more days of IV diuresis -Daily weights -Strict I/O   3. PAF: -Maintaining sinus rhythm -CHADS2VASc 6 -Eliquis   4. Aortic stenosis: -Mild to moderate by echo this admission -Does not appear to be a primary issue at the present time -Outpatient follow up   5. Mitral/tricuspid regurgitation: -Diuresis as above   6. Junctional bradycardia: -Previously noted on beta blocker and calcium channel blocker -No evidence of recurrence on telemetry    7. AKI: -Improved -Monitor with diuresis   8. HTN: -Blood pressure stable   9. HLD: -PTA atorvastatin    10. COPD: -Per primary service        For questions or updates, please contact [facility] Please consult [email or web address] for contact info under Cardiology/STEMI.    Signed, Christell Faith, PA-C [place] Pager: [phone] 02/08/2021, 10:19 AM

## 2021-02-08 NOTE — Progress Notes (Signed)
Occupational Therapy Treatment Patient Details Name: Karen [name] MRN: [id number] DOB: Mar 26, 1945 Today's Date: 02/08/2021   History of present illness 75 year old F with PMH of COPD, chronic hypoxic RF on 3 L, diastolic CHF, moderate aortic stenosis, A. fib, DM-2 and morbid obesity presenting with decreased urine output, SOB, DOE, orthopnea, edema, chest pressure, abdominal pain, distention and diarrhea, and admitted with working diagnosis of acute diastolic CHF.   OT comments  Upon entering the room, pt recovering from transfer to recliner chair with PT in room. Pt is agreeable to OT intervention but is visibly fatigued. Focus on B UE strengthening and pursed lip breathing with use of towel as dowel rod. Pt performing 2 sets of 10 chest presses, bicep curls, and forwards rows with rest breaks needed after each set of 10 reps. Min cuing for proper technique. O2 remained above 90% while on 6Ls via [place]. Pt also reporting her husband going to urgent care with back problems this morning. He is her only assistance at home. Pt continues to benefit from OT intervention.    Recommendations for follow up therapy are one component of a multi-disciplinary discharge planning process, led by the attending physician.  Recommendations may be updated based on patient status, additional functional criteria and insurance authorization.    Follow Up Recommendations  Skilled nursing-short term rehab (<3 hours/day)    Assistance Recommended at Discharge Frequent or constant Supervision/Assistance  Equipment Recommendations  Other (comment) (defer to next venue of care)       Precautions / Restrictions Precautions Precautions: Fall Restrictions Weight Bearing Restrictions: No       Mobility Bed Mobility Overal bed mobility: Needs Assistance Bed Mobility: Supine to Sit     Supine to sit: Supervision;HOB elevated     General bed mobility comments: seated in recliner chair    Transfers Overall  transfer level: Needs assistance Equipment used: Rolling walker (2 wheels) Transfers: Sit to/from Stand Sit to Stand: Min assist           General transfer comment: for multiple STS from EOB with RW; verbal cues for safety and hand placement     Balance Overall balance assessment: Needs assistance Sitting-balance support: Feet supported Sitting balance-Leahy Scale: Good     Standing balance support: During functional activity;Bilateral upper extremity supported;Reliant on assistive device for balance Standing balance-Leahy Scale: Fair                             ADL either performed or assessed with clinical judgement    Extremity/Trunk Assessment Upper Extremity Assessment Upper Extremity Assessment: Generalized weakness   Lower Extremity Assessment Lower Extremity Assessment: Generalized weakness        Vision Patient Visual Report: No change from baseline            Cognition Arousal/Alertness: Awake/alert Behavior During Therapy: WFL for tasks assessed/performed;Flat affect Overall Cognitive Status: Within Functional Limits for tasks assessed                                            Exercises Other Exercises Other Exercises: Participated in bed mobility, transfers, and limited gait with RW. Performed bil ankle pumps, LAQ, and isometric hip ADD in sitting x10. Other Exercises: Pt educated re: PT role/POC, DC recommendations, safety with mobility, PLB.      General Comments SpO2 >/=90%  throughout session, with desat to 86% post gait (68ft); able to recover >90% with seated rest break and cues for PLB    Pertinent Vitals/ Pain       Pain Assessment: No/denies pain Pain Score: 9  Pain Location: bil knees (arthritis) Pain Descriptors / Indicators: Aching;Discomfort Pain Intervention(s): Limited activity within patient's tolerance;Monitored during session;Repositioned         Frequency  Min 2X/week        Progress  Toward Goals  OT Goals(current goals can now be found in the care plan section)  Progress towards OT goals: Progressing toward goals  Acute Rehab OT Goals Patient Stated Goal: to go home OT Goal Formulation: With patient Time For Goal Achievement: 02/16/21 Potential to Achieve Goals: Good  Plan Discharge plan remains appropriate;Frequency remains appropriate       AM-PAC OT "6 Clicks" Daily Activity     Outcome Measure   Help from another person eating meals?: None Help from another person taking care of personal grooming?: A Little Help from another person toileting, which includes using toliet, bedpan, or urinal?: A Lot Help from another person bathing (including washing, rinsing, drying)?: A Lot Help from another person to put on and taking off regular upper body clothing?: A Little Help from another person to put on and taking off regular lower body clothing?: A Lot 6 Click Score: 16    End of Session Equipment Utilized During Treatment: Oxygen (6Ls)  OT Visit Diagnosis: Unsteadiness on feet (R26.81);Repeated falls (R29.6);Muscle weakness (generalized) (M62.81)   Activity Tolerance Patient limited by fatigue   Patient Left with call bell/phone within reach;in chair;with chair alarm set   Nurse Communication Mobility status        Time: 6010-9323 OT Time Calculation (min): 19 min  Charges: OT General Charges $OT Visit: 1 Visit OT Treatments $Therapeutic Exercise: 8-22 mins  Darleen Crocker, MS, OTR/L , CBIS ascom [phone]  02/08/21, 1:33 PM

## 2021-02-09 DIAGNOSIS — J9621 Acute and chronic respiratory failure with hypoxia: Secondary | ICD-10-CM | POA: Diagnosis not present

## 2021-02-09 DIAGNOSIS — I480 Paroxysmal atrial fibrillation: Secondary | ICD-10-CM | POA: Diagnosis not present

## 2021-02-09 DIAGNOSIS — I5033 Acute on chronic diastolic (congestive) heart failure: Secondary | ICD-10-CM | POA: Diagnosis not present

## 2021-02-09 DIAGNOSIS — I5031 Acute diastolic (congestive) heart failure: Secondary | ICD-10-CM | POA: Diagnosis not present

## 2021-02-09 DIAGNOSIS — N179 Acute kidney failure, unspecified: Secondary | ICD-10-CM | POA: Diagnosis not present

## 2021-02-09 DIAGNOSIS — R531 Weakness: Secondary | ICD-10-CM

## 2021-02-09 DIAGNOSIS — E1169 Type 2 diabetes mellitus with other specified complication: Secondary | ICD-10-CM | POA: Diagnosis not present

## 2021-02-09 DIAGNOSIS — I350 Nonrheumatic aortic (valve) stenosis: Secondary | ICD-10-CM | POA: Diagnosis not present

## 2021-02-09 LAB — GLUCOSE, CAPILLARY
Glucose-Capillary: 196 mg/dL — ABNORMAL HIGH (ref 70–99)
Glucose-Capillary: 264 mg/dL — ABNORMAL HIGH (ref 70–99)
Glucose-Capillary: 313 mg/dL — ABNORMAL HIGH (ref 70–99)

## 2021-02-09 LAB — BASIC METABOLIC PANEL
Anion gap: 8 (ref 5–15)
BUN: 20 mg/dL (ref 8–23)
CO2: 38 mmol/L — ABNORMAL HIGH (ref 22–32)
Calcium: 9 mg/dL (ref 8.9–10.3)
Chloride: 89 mmol/L — ABNORMAL LOW (ref 98–111)
Creatinine, Ser: 0.85 mg/dL (ref 0.44–1.00)
GFR, Estimated: >60 mL/min (ref >60)
Glucose, Bld: 195 mg/dL — ABNORMAL HIGH (ref 70–99)
Potassium: 3.7 mmol/L (ref 3.5–5.1)
Sodium: 135 mmol/L (ref 135–145)

## 2021-02-09 LAB — MAGNESIUM: Magnesium: 1.8 mg/dL (ref 1.7–2.4)

## 2021-02-09 MED ORDER — ALBUTEROL SULFATE (2.5 MG/3ML) 0.083% IN NEBU
2.5000 mg | INHALATION_SOLUTION | Freq: Four times a day (QID) | RESPIRATORY_TRACT | Status: DC
  Administered 2021-02-09 – 2021-02-11 (×8): 2.5 mg via RESPIRATORY_TRACT
  Filled 2021-02-09 (×8): qty 3

## 2021-02-09 MED ORDER — METHYLPREDNISOLONE SODIUM SUCC 40 MG IJ SOLR
40.0000 mg | Freq: Every day | INTRAMUSCULAR | Status: DC
  Administered 2021-02-09 – 2021-02-11 (×3): 40 mg via INTRAVENOUS
  Filled 2021-02-09 (×3): qty 1

## 2021-02-09 NOTE — Progress Notes (Signed)
Progress Note  Patient Name: Karen [name] Date of Encounter: 02/09/2021  Primary Cardiologist: Rockey Situ  Subjective   Dyspnea improving.  No chest pain or palpitations.  She underwent successful ultrasound-guided thoracentesis 12/5 with 600 cc of fluid removed.  Cytology negative for malignancy.  Documented urine output 530 mL for the past 24 hours on titrated dose of IV Lasix with a net -10.0 L for the admission.  Weight trend 98.3 to 97.1 kg over the past 24 hours.  BUN/SCr starting to trend upwards, though remain normal.  She continues to require increased supplemental oxygen via nasal cannula at 6 L, baseline 3 L, despite significant diuresis and thoracentesis.   Inpatient Medications    Scheduled Meds:  allopurinol  100 mg Oral Daily   apixaban  5 mg Oral BID   atorvastatin  10 mg Oral Daily   bethanechol  10 mg Oral TID   ferrous sulfate  325 mg Oral Daily   fluticasone  2 spray Each Nare Daily   fluticasone furoate-vilanterol  1 puff Inhalation Daily   furosemide  40 mg Intravenous BID   insulin aspart  0-20 Units Subcutaneous TID WC   insulin aspart  0-5 Units Subcutaneous QHS   insulin aspart  7 Units Subcutaneous TID WC   insulin glargine-yfgn  30 Units Subcutaneous Daily   montelukast  10 mg Oral QHS   pantoprazole  40 mg Oral Daily   traZODone  50 mg Oral QHS   umeclidinium bromide  1 puff Inhalation Daily   vitamin B-12  500 mcg Oral Daily   Continuous Infusions:  PRN Meds: acetaminophen, alum & mag hydroxide-simeth, diphenhydrAMINE, guaiFENesin-dextromethorphan, ipratropium-albuterol, ondansetron **OR** ondansetron (ZOFRAN) IV, oxyCODONE   Vital Signs    Vitals:   02/09/21 0014 02/09/21 0401 02/09/21 0402 02/09/21 0751  BP: (!) 121/53 (!) 124/53  (!) 143/59  Pulse: 89 75  85  Resp: (!) 22   16  Temp: 98.1 F (36.7 C)   97.7 F (36.5 C)  TempSrc: Oral   Oral  SpO2: 97% 96%  95%  Weight:   97.1 kg   Height:        Intake/Output Summary (Last 24  hours) at 02/09/2021 0800 Last data filed at 02/09/2021 0424 Gross per 24 hour  Intake 720 ml  Output 1250 ml  Net -530 ml    Filed Weights   02/06/21 0500 02/08/21 1014 02/09/21 0402  Weight: 103.2 kg 98.3 kg 97.1 kg    Telemetry    Sinus rhythm/bradycardia with PVCs - Personally Reviewed  ECG    No new tracings - Personally Reviewed  Physical Exam   GEN: No acute distress.   Neck: JVD is difficult to visualize secondary to body habitus. Cardiac: RRR, I/VI systolic murmur RUSB, no rubs, or gallops.  Respiratory: Diminished breath sounds along the bilateral bases bilaterally.  GI: Soft, nontender, non-distended.   MS: Trace to 1+ bilateral lower extremity edema; No deformity. Neuro:  Alert and oriented x 3; Nonfocal.  Psych: Normal affect.  Labs    Chemistry Recent Labs  Lab 02/05/21 0531 02/06/21 1057 02/07/21 0448 02/08/21 0318 02/09/21 0415  NA 136 136 137 135 135  K 3.8 3.8 3.9 3.7 3.7  CL 94* 92* 91* 90* 89*  CO2 33* 38* 37* 39* 38*  GLUCOSE 155* 202* 189* 178* 195*  BUN 18 14 15 17 20   CREATININE 0.66 0.70 0.78 0.70 0.85  CALCIUM 8.6* 8.9 8.8* 8.5* 9.0  PROT  --  6.1*  --   --   --   ALBUMIN 3.0* 3.0* 3.1*  --   --   AST  --  12*  --   --   --   ALT  --  9  --   --   --   ALKPHOS  --  75  --   --   --   BILITOT  --  0.8  --   --   --   GFRNONAA >60 >60 >60 >60 >60  ANIONGAP 9 6 9 6 8       Hematology Recent Labs  Lab 02/06/21 1057 02/07/21 0448 02/08/21 0318  WBC 9.2 10.7* 10.0  RBC 3.63* 3.66* 3.55*  HGB 9.2* 9.3* 8.8*  HCT 30.0* 30.2* 29.8*  MCV 82.6 82.5 83.9  MCH 25.3* 25.4* 24.8*  MCHC 30.7 30.8 29.5*  RDW 17.6* 17.5* 17.8*  PLT 280 273 255     Cardiac EnzymesNo results for input(s): TROPONINI in the last 168 hours. No results for input(s): TROPIPOC in the last 168 hours.   BNP Recent Labs  Lab 02/05/21 0531 02/06/21 1057 02/07/21 0448  BNP 302.3* 236.0* 231.6*      DDimer  Recent Labs  Lab 02/08/21 0928  DDIMER  0.77*      Radiology    DG Chest Port 1 View  Result Date: 02/07/2021 IMPRESSION: There is no pneumothorax. Small bilateral pleural effusions. Cardiomegaly. There is prominence of interstitial markings in the parahilar regions and lower lung fields suggesting mild interstitial edema or interstitial pneumonitis. Electronically Signed   By: Elmer Picker M.D.   On: 02/07/2021 10:40   IR THORACENTESIS ASP PLEURAL SPACE W/IMG GUIDE  Result Date: 02/07/2021 IMPRESSION: Successful ultrasound guided RIGHT thoracentesis yielding 600 mL of pleural fluid. Read By: Tsosie Billing PA-C Electronically Signed   By: Michaelle Birks M.D.   On: 02/07/2021 12:01    Cardiac Studies   2D echo 02/06/2021: 1. Left ventricular ejection fraction, by estimation, is 60 to 65%. The  left ventricle has normal function. The left ventricle has no regional  wall motion abnormalities. Left ventricular diastolic parameters are  consistent with Grade I diastolic  dysfunction (impaired relaxation).   2. Right ventricular systolic function is mildly reduced. The right  ventricular size is mildly enlarged. There is severely elevated pulmonary  artery systolic pressure. The estimated right ventricular systolic  pressure is 96.7 mmHg.   3. The mitral valve is normal in structure. Mild to moderate mitral valve  regurgitation. No evidence of mitral stenosis.   4. Tricuspid valve regurgitation is moderate to severe.   5. The aortic valve is normal in structure. Aortic valve regurgitation is  not visualized. Mild aortic valve stenosis. Aortic valve mean gradient  measures 17.0 mmHg. Aortic valve Vmax measures 2.71 m/s.   6. The inferior vena cava is dilated in size with >50% respiratory  variability, suggesting right atrial pressure of 8 mmHg. __________   2D echo 06/2020: 1. Left ventricular ejection fraction, by estimation, is 60 to 65%. The  left ventricle has normal function. There is mild left ventricular   hypertrophy.   2. Right ventricular systolic function is normal. The right ventricular  size is normal.   3. Trivial mitral valve regurgitation.   4. AV is thickened, calcified Difficult to see well Peak and mean  gradients through the vlave are 30 and 15 mm Hg respectively AVA (VTI) is  1.03 cm2. Dimensionless index is 0.44. All consistent with mild to  moderate AS. Compared to echo report from September 28, 2019 no significant change . Aortic valve regurgitation is not  visualized.   5. The inferior vena cava is normal in size with greater than 50%  respiratory variability, suggesting right atrial pressure of 3 mmHg. __________   2D echo 09/2019: 1. Left ventricular ejection fraction, by estimation, is 55 to 60%. The  left ventricle has normal function. The left ventricle has no regional  wall motion abnormalities. There is mild left ventricular hypertrophy.  Left ventricular diastolic parameters  are consistent with Grade I diastolic dysfunction (impaired relaxation).   2. Right ventricular systolic function is normal. The right ventricular  size is normal. There is severely elevated pulmonary artery systolic  pressure. The estimated right ventricular systolic pressure is 40.9 mmHg.   3. Left atrial size was mildly dilated.   4. The mitral valve is normal in structure. Trivial mitral valve  regurgitation. No evidence of mitral stenosis.   5. The aortic valve is abnormal. Aortic valve regurgitation is not  visualized. Moderate aortic valve stenosis. Aortic valve area, by VTI  measures 0.94 cm. Aortic valve mean gradient measures 17.3 mmHg. __________   Elwyn Reach patch 02/2019: Normal sinus rhythm with paroxysmal atrial fibrillation   avg HR of 89 bpm.  1 run of Ventricular Tachycardia occurred lasting 4 beats with a max rate of 179 bpm (avg 161 bpm).    Atrial Fibrillation occurred (<1% burden), ranging from 75-179 bpm (avg of 117 bpm), the longest lasting 32 mins 50 secs with an avg  rate of 119 bpm.    Isolated SVEs were occasional (1.4%, 25270), SVE Couplets were rare (<1.0%, 2710), and SVE Triplets were rare (<1.0%, 299). Isolated VEs were rare (<1.0%, 617), VE Couplets were rare (<1.0%, 4), and VE Triplets were rare (<1.0%, 1). Ventricular Bigeminy was present.  __________   2D echo 02/2019: 1. Left ventricular ejection fraction, by visual estimation, is 65 to  70%. The left ventricle has normal function. Left ventricular septal wall  thickness was normal. Normal left ventricular posterior wall thickness.  There is no left ventricular  hypertrophy.   2. The left ventricle has no regional wall motion abnormalities.   3. Global right ventricle has normal systolic function.The right  ventricular size is normal. No increase in right ventricular wall  thickness.   4. Left atrial size was normal.   5. Right atrial size was normal.   6. The mitral valve is grossly normal. Trivial mitral valve  regurgitation.   7. The tricuspid valve is grossly normal. Tricuspid valve regurgitation  is trivial.   8. The aortic valve was not well visualized. Aortic valve regurgitation  is not visualized.   9. The pulmonic valve was not well visualized. Pulmonic valve  regurgitation is trivial.  10. The aortic root was not well visualized.  11. Moderately elevated pulmonary artery systolic pressure.  12. The atrial septum is grossly normal. __________   2D echo 08/2018: 1. The left ventricle has low normal systolic function, with an ejection  fraction of 50-55%. The cavity size was normal. There is mild concentric  left ventricular hypertrophy. Left ventricular diastolic Doppler  parameters are indeterminate.   2. The right ventricle has low normal systolic function. The cavity was  mildly enlarged. There is no increase in right ventricular wall thickness.  Right ventricular systolic pressure is moderately elevated with an  estimated pressure of 60.4 mmHg.   3. Left atrial size  was mildly dilated.  4. Right atrial size was mildly dilated.   5. The mitral valve is grossly normal.   6. The tricuspid valve is grossly normal. Tricuspid valve regurgitation  is mild-moderate.   7. The aortic valve is grossly normal. Mild thickening of the aortic  valve. Mild calcification of the aortic valve. Aortic valve regurgitation  was not assessed by color flow Doppler. Mild stenosis of the aortic valve.   8. The inferior vena cava was dilated in size with <50% respiratory  variability. __________   2D echo 06/2018:  1. The left ventricle has low normal systolic function, with an ejection  fraction of 50-55%. The cavity size was normal. Left ventricular diastolic  parameters were normal.   2. The right ventricle has normal systolic function. The cavity was  normal. There is no increase in right ventricular wall thickness.   3. Mild thickening of the mitral valve leaflet.   4. The aortic valve is tricuspid. Moderate thickening of the aortic  valve. Mild calcification of the aortic valve. Aortic valve regurgitation  is trivial by color flow Doppler. Mild stenosis of the aortic valve.  Patient Profile     75 y.o. female with history of HFpEF, severe pulmonary hypertension, moderate aortic stenosis, junctional bradycardia with BB/CCB, PAF by 03/2019 monitor, chronic chest pain, GI bleed, Covid in 10/2020, DM2, HTN, HLD, chronic hypoxic respiratory failure on supplemental oxygen, COPD, anemia, and obesity who is being seen today for the evaluation of acute on chronic HFpEF/pulmonary hypertension at the request of Dr. Grandville Silos.  Assessment & Plan    1. Acute on chronic hypoxic respiratory failure: -Likely in the setting of acute on chronic HFpEF, severe pulmonary hypertension, and right-sided pleural effusion with underlying COPD with poor pulmonary reserve, aortic stenosis, anemia, obesity, and physical deconditioning  -Wean supplemental oxygen as tolerated  -Status post  right-sided thoracentesis on 12/5 with 600 cc of fluid removed with cytology pending   2. Acute on chronic HFpEF/severe pulmonary hypertension/pleural effusion: -She remains volume up -Status post thoracentesis as above -Continue IV Lasix 40 mg bid, may be nearing transition to oral diuretic with slightly up trending BUN/SCr, reevaluate tomorrow -Suspect she will require several more days of IV diuresis -Daily weights -Strict I/O   3. PAF: -Maintaining sinus rhythm -CHADS2VASc 6 -Eliquis   4. Aortic stenosis: -Mild to moderate by echo this admission -Does not appear to be a primary issue at the present time -Outpatient follow up   5. Mitral/tricuspid regurgitation: -Diuresis as above   6. Junctional bradycardia: -Previously noted on beta blocker and calcium channel blocker -No evidence of recurrence on telemetry    7. AKI: -Improved -Monitor with diuresis   8. HTN: -Blood pressure stable   9. HLD: -PTA atorvastatin    10. COPD: -Per primary service       For questions or updates, please contact [facility] Please consult [email or web address] for contact info under Cardiology/STEMI.    Signed, Christell Faith, PA-C [place] Pager: [phone] 02/09/2021, 8:00 AM

## 2021-02-09 NOTE — Plan of Care (Signed)
?  Problem: Education: ?Goal: Ability to verbalize understanding of medication therapies will improve ?Outcome: Progressing ?  ?Problem: Activity: ?Goal: Capacity to carry out activities will improve ?Outcome: Progressing ?  ?Problem: Cardiac: ?Goal: Ability to achieve and maintain adequate cardiopulmonary perfusion will improve ?Outcome: Progressing ?  ?

## 2021-02-09 NOTE — Progress Notes (Signed)
Occupational Therapy Treatment Patient Details Name: Karen [name] MRN: [id number] DOB: [birth year] Today's Date: 02/09/2021   History of present illness 75 year old F with PMH of COPD, chronic hypoxic RF on 3 L, diastolic CHF, moderate aortic stenosis, A. fib, DM-2 and morbid obesity presenting with decreased urine output, SOB, DOE, orthopnea, edema, chest pressure, abdominal pain, distention and diarrhea, and admitted with working diagnosis of acute diastolic CHF.   OT comments  Pt seen for OT tx this date to f/u re: safety with ADLs/ADL mobility. OT engages pt in sup to sit with CGA and below-listed exercise as well as oral care with SETUP. Pt tolerates EOB sitting x10 minutes. OT engages pt in STS with MOD A and pt takes 4 small shuffling side steps with assist toward HOB. Pt returned to bed with MIN A. O2 stable throughout session. Will continue to follow. Continue to recommend STR.    Recommendations for follow up therapy are one component of a multi-disciplinary discharge planning process, led by the attending physician.  Recommendations may be updated based on patient status, additional functional criteria and insurance authorization.    Follow Up Recommendations  Skilled nursing-short term rehab (<3 hours/day)    Assistance Recommended at Discharge Frequent or constant Supervision/Assistance  Equipment Recommendations  Other (comment) (defer)    Recommendations for Other Services      Precautions / Restrictions Precautions Precautions: Fall Restrictions Weight Bearing Restrictions: No       Mobility Bed Mobility Overal bed mobility: Needs Assistance Bed Mobility: Supine to Sit     Supine to sit: Min guard Sit to supine: Min assist   General bed mobility comments: MIN A to manage LEs back to bed    Transfers Overall transfer level: Needs assistance Equipment used: Rolling walker (2 wheels) Transfers: Sit to/from Stand Sit to Stand: Mod assist            General transfer comment: MOD A to CTS and take small shuffling steps to move up toward [facility]     Balance Overall balance assessment: Needs assistance Sitting-balance support: Feet supported Sitting balance-Leahy Scale: Good     Standing balance support: During functional activity;Bilateral upper extremity supported;Reliant on assistive device for balance Standing balance-Leahy Scale: Poor Standing balance comment: requires UE support to sustain static stand, MIN A for small shulffling steps                           ADL either performed or assessed with clinical judgement   ADL Overall ADL's : Needs assistance/impaired     Grooming: Oral care;Set up;Sitting Grooming Details (indicate cue type and reason): EOB sitting x10 minutes             Lower Body Dressing: Maximal assistance;Sitting/lateral leans Lower Body Dressing Details (indicate cue type and reason): to don slip on shoes while in EOB sitting                    Extremity/Trunk Assessment              Vision       Perception     Praxis      Cognition Arousal/Alertness: Awake/alert Behavior During Therapy: WFL for tasks assessed/performed;Flat affect Overall Cognitive Status: Within Functional Limits for tasks assessed  Exercises Other Exercises Other Exercises: OT engages pt in seated postural extension with scap retraction with ~2-3 sec hold while deep inhale performed to increase SA for quality breathing. Pt and spouse educated on rationale.   Shoulder Instructions       General Comments      Pertinent Vitals/ Pain       Pain Assessment: No/denies pain  Home Living                                          Prior Functioning/Environment              Frequency  Min 2X/week        Progress Toward Goals  OT Goals(current goals can now be found in the care plan section)  Progress  towards OT goals: Progressing toward goals  Acute Rehab OT Goals Patient Stated Goal: to go home OT Goal Formulation: With patient Time For Goal Achievement: 02/16/21 Potential to Achieve Goals: Good  Plan Discharge plan remains appropriate;Frequency remains appropriate    Co-evaluation                 AM-PAC OT "6 Clicks" Daily Activity     Outcome Measure   Help from another person eating meals?: None Help from another person taking care of personal grooming?: A Little Help from another person toileting, which includes using toliet, bedpan, or urinal?: A Lot Help from another person bathing (including washing, rinsing, drying)?: A Lot Help from another person to put on and taking off regular upper body clothing?: A Little Help from another person to put on and taking off regular lower body clothing?: A Lot 6 Click Score: 16    End of Session Equipment Utilized During Treatment: Oxygen;Rolling walker (2 wheels)  OT Visit Diagnosis: Unsteadiness on feet (R26.81);Repeated falls (R29.6);Muscle weakness (generalized) (M62.81)   Activity Tolerance Patient limited by fatigue   Patient Left with call bell/phone within reach;in chair;with chair alarm set   Nurse Communication          Time: [id number] OT Time Calculation (min): 23 min  Charges: OT General Charges $OT Visit: 1 Visit OT Treatments $Self Care/Home Management : 8-22 mins $Therapeutic Activity: 8-22 mins  Gerrianne Scale, [place], OTR/L ascom [phone] 02/09/21, 5:34 PM

## 2021-02-09 NOTE — Progress Notes (Signed)
Physical Therapy Treatment Patient Details Name: Karen [name] MRN: [id number] DOB: Feb 23, 1946 Today's Date: 02/09/2021   History of Present Illness 75 year old F with PMH of COPD, chronic hypoxic RF on 3 L, diastolic CHF, moderate aortic stenosis, A. fib, DM-2 and morbid obesity presenting with decreased urine output, SOB, DOE, orthopnea, edema, chest pressure, abdominal pain, distention and diarrhea, and admitted with working diagnosis of acute diastolic CHF.    PT Comments    Pt is making gradual progress towards goals and reports increased fatigue present this date. Agreeable to sitting at EOB but becomes SOB with exertion. Seated there-ex performed at bedside with rest breaks required x 3. Able to take several small steps towards [facility], declines to sit in recliner this date. Will continue to progress as able.  Recommendations for follow up therapy are one component of a multi-disciplinary discharge planning process, led by the attending physician.  Recommendations may be updated based on patient status, additional functional criteria and insurance authorization.  Follow Up Recommendations  Skilled nursing-short term rehab (<3 hours/day)     Assistance Recommended at Discharge Intermittent Supervision/Assistance  Equipment Recommendations  Other (comment)    Recommendations for Other Services       Precautions / Restrictions Precautions Precautions: Fall Restrictions Weight Bearing Restrictions: No     Mobility  Bed Mobility Overal bed mobility: Needs Assistance Bed Mobility: Supine to Sit     Supine to sit: Min guard Sit to supine: Min assist   General bed mobility comments: needs cues for initiation of activity and assist with B LE once returning to bed    Transfers Overall transfer level: Needs assistance Equipment used: Rolling walker (2 wheels) Transfers: Sit to/from Stand Sit to Stand: Mod assist           General transfer comment: needs assist for ant  weight shift and trunkal elevation. once standing, heavy use of UE on RW.    Ambulation/Gait Ambulation/Gait assistance: Min guard Gait Distance (Feet): 2 Feet Assistive device: Rolling walker (2 wheels) Gait Pattern/deviations: Step-to pattern;Trunk flexed;Narrow base of support;Decreased stride length       General Gait Details: very slow side steps up towards HOB. O2 on 5L with sats at 94% post exertion.   Stairs             Wheelchair Mobility    Modified Rankin (Stroke Patients Only)       Balance Overall balance assessment: Needs assistance Sitting-balance support: Feet supported Sitting balance-Leahy Scale: Good     Standing balance support: During functional activity;Bilateral upper extremity supported;Reliant on assistive device for balance Standing balance-Leahy Scale: Poor                              Cognition Arousal/Alertness: Awake/alert Behavior During Therapy: WFL for tasks assessed/performed;Flat affect Overall Cognitive Status: Within Functional Limits for tasks assessed                                          Exercises Other Exercises Other Exercises: seated ther-ex performed on B LE including hip add squeezes, AP, LAQ, B UE scap squeezes, B UE reaching in all directions. All ther-ex performed x 15 reps with supervision with careful monitoring of O2 sats.    General Comments        Pertinent Vitals/Pain Pain Assessment: No/denies pain  Home Living                          Prior Function            PT Goals (current goals can now be found in the care plan section) Acute Rehab PT Goals Patient Stated Goal: rehab at [facility] PT Goal Formulation: With patient Time For Goal Achievement: 02/16/21 Potential to Achieve Goals: Good Progress towards PT goals: Progressing toward goals    Frequency    Min 2X/week      PT Plan Current plan remains appropriate    Co-evaluation               AM-PAC PT "6 Clicks" Mobility   Outcome Measure  Help needed turning from your back to your side while in a flat bed without using bedrails?: A Little Help needed moving from lying on your back to sitting on the side of a flat bed without using bedrails?: A Little Help needed moving to and from a bed to a chair (including a wheelchair)?: A Little Help needed standing up from a chair using your arms (e.g., wheelchair or bedside chair)?: A Little Help needed to walk in hospital room?: A Little Help needed climbing 3-5 steps with a railing? : A Lot 6 Click Score: 17    End of Session Equipment Utilized During Treatment: Oxygen;Gait belt Activity Tolerance: Patient limited by fatigue Patient left: in bed;with bed alarm set (refused chair due to staff complaints) Nurse Communication: Mobility status PT Visit Diagnosis: Muscle weakness (generalized) (M62.81);Unsteadiness on feet (R26.81);Difficulty in walking, not elsewhere classified (R26.2)     Time: 6789-3810 PT Time Calculation (min) (ACUTE ONLY): 25 min  Charges:  $Therapeutic Exercise: 23-37 mins                     Greggory Stallion, [place], DPT [phone]    Yalexa Blust 02/09/2021, 3:37 PM

## 2021-02-09 NOTE — Progress Notes (Signed)
Patient ID: Karen [name], female   DOB: [birth year], 75 y.o.   MRN: [id number] [facility] PROGRESS NOTE  Karen [name] NIO:[id number] DOB: 11-13-1945 DOA: 02/01/2021 PCP: Earlie Counts, FNP  HPI/Subjective: Patient complains of shortness of breath and cough.  Patient has been undergoing diuresis with IV Lasix as per cardiology.  Initially admitted with difficulty voiding and shortness of breath 7 days ago.  Objective: Vitals:   02/09/21 0751 02/09/21 1120  BP: (!) 143/59 (!) 135/103  Pulse: 85 76  Resp: 16 16  Temp: 97.7 F (36.5 C) 98 F (36.7 C)  SpO2: 95% 92%    Intake/Output Summary (Last 24 hours) at 02/09/2021 1240 Last data filed at 02/09/2021 1019 Gross per 24 hour  Intake 720 ml  Output 1350 ml  Net -630 ml   Filed Weights   02/06/21 0500 02/08/21 1014 02/09/21 0402  Weight: 103.2 kg 98.3 kg 97.1 kg    ROS: Review of Systems  Respiratory:  Positive for cough and shortness of breath.   Cardiovascular:  Negative for chest pain.  Gastrointestinal:  Negative for abdominal pain, nausea and vomiting.  Exam: Physical Exam HENT:     Head: Normocephalic.     Mouth/Throat:     Pharynx: No oropharyngeal exudate.  Eyes:     General: Lids are normal.     Conjunctiva/sclera: Conjunctivae normal.  Cardiovascular:     Rate and Rhythm: Normal rate and regular rhythm.     Heart sounds: Normal heart sounds, S1 normal and S2 normal.  Pulmonary:     Breath sounds: Decreased air movement present. Examination of the right-middle field reveals decreased breath sounds and wheezing. Examination of the left-middle field reveals decreased breath sounds and wheezing. Examination of the right-lower field reveals decreased breath sounds and rhonchi. Examination of the left-lower field reveals decreased breath sounds and rhonchi. Decreased breath sounds, wheezing and rhonchi present. No rales.  Abdominal:     Palpations: Abdomen is soft.     Tenderness: There is no abdominal  tenderness.  Musculoskeletal:     Right lower leg: Swelling present.     Left lower leg: Swelling present.  Skin:    General: Skin is warm.     Findings: No rash.  Neurological:     Mental Status: She is alert and oriented to person, place, and time.      Scheduled Meds:  albuterol  2.5 mg Nebulization Q6H   allopurinol  100 mg Oral Daily   apixaban  5 mg Oral BID   atorvastatin  10 mg Oral Daily   bethanechol  10 mg Oral TID   ferrous sulfate  325 mg Oral Daily   fluticasone  2 spray Each Nare Daily   fluticasone furoate-vilanterol  1 puff Inhalation Daily   furosemide  40 mg Intravenous BID   insulin aspart  0-20 Units Subcutaneous TID WC   insulin aspart  0-5 Units Subcutaneous QHS   insulin aspart  7 Units Subcutaneous TID WC   insulin glargine-yfgn  30 Units Subcutaneous Daily   methylPREDNISolone (SOLU-MEDROL) injection  40 mg Intravenous Daily   montelukast  10 mg Oral QHS   pantoprazole  40 mg Oral Daily   traZODone  50 mg Oral QHS   umeclidinium bromide  1 puff Inhalation Daily   vitamin B-12  500 mcg Oral Daily     Assessment/Plan:  Acute on chronic hypoxic respiratory failure.  COPD exacerbation.  This morning when I saw her she was on 6  L.  Now down to 5 L.  Patient with poor air entry.  Add nebulizer treatment standing dose and Solu-Medrol. Acute on chronic diastolic congestive heart failure with pulmonary hypertension.  Moderate to severe tricuspid regurgitation, mild to moderate mitral regurgitation.  Patient on IV Lasix as per cardiology. Acute kidney injury on chronic kidney disease stage II.  Creatinine peaked at 1.28 and is 0.85 today. Right pleural effusion status postthoracentesis on 02/07/2021 with 600 mL removed Type 2 diabetes mellitus with hyperlipidemia on glargine insulin 30 units daily and atorvastatin 10 mg daily.  With starting steroid sugars will likely be higher.  Continue short acting insulin prior to meals plus sliding scale. Paroxysmal  atrial fibrillation on Eliquis for anticoagulation Acute metabolic encephalopathy has resolved UTI secondary to E. coli and Klebsiella completed antibiotics Weakness.  Physical therapy recommending rehab        Code Status:     Code Status Orders  (From admission, onward)           Start     Ordered   02/03/21 1806  Do not attempt resuscitation (DNR)  Continuous       Question Answer Comment  In the event of cardiac or respiratory ARREST Do not call a "code blue"   In the event of cardiac or respiratory ARREST Do not perform Intubation, CPR, defibrillation or ACLS   In the event of cardiac or respiratory ARREST Use medication by any route, position, wound care, and other measures to relive pain and suffering. May use oxygen, suction and manual treatment of airway obstruction as needed for comfort.      02/03/21 1805           Code Status History     Date Active Date Inactive Code Status Order ID Comments User Context   02/01/2021 1653 02/03/2021 1805 Full Code [id number]  Collier Bullock, MD ED   10/29/2020 1650 11/09/2020 1747 Full Code [id number]  Cox, [place], DO ED   06/17/2020 2215 06/21/2020 2140 Full Code [id number]  Mansy, Arvella Merles, MD ED   05/21/2020 2251 05/25/2020 1950 Full Code [id number]  Athena Masse, MD Inpatient   09/28/2019 0120 10/02/2019 2116 DNR [id number]  Sueanne Margarita, [place] [facility]   06/28/2019 1333 06/30/2019 1619 DNR [id number]  Ivor Costa, MD Inpatient   06/28/2019 1255 06/28/2019 1333 DNR [id number]  Ivor Costa, MD Inpatient   02/23/2019 1938 02/27/2019 1810 Full Code [id number]  Edwin Dada, MD Inpatient   02/13/2019 2011 02/16/2019 1819 DNR [id number]  Nicolette Bang, DO ED   09/14/2018 2220 09/17/2018 2350 DNR [id number]  Henreitta Leber, MD Inpatient   08/05/2018 1547 08/10/2018 1949 Full Code [id number]  Gladstone Lighter, MD Inpatient   06/25/2018 1417 06/30/2018 1557 Full Code [id number]  Epifanio Lesches, MD ED   09/15/2017 1747 09/18/2017 2106 DNR  [id number]  Hillary Bow, MD ED   08/26/2017 2216 08/30/2017 1604 Full Code [id number]  Nicholes Mango, MD Inpatient   01/08/2015 0307 01/09/2015 1336 Full Code [id number]  Theodoro Grist, MD Inpatient      Family Communication: Updated husband on the phone Disposition Plan: Status is: Inpatient  Time spent: 27 minutes  [place]

## 2021-02-09 NOTE — TOC Progression Note (Signed)
Transition of Care [facility]) - Progression Note    Patient Details  Name: Karen [name] MRN: [id number] Date of Birth: [birth year]  Transition of [facility]) CM/SW [place], [place] Phone Number: 02/09/2021, 9:09 AM  Clinical Narrative:     CSW informed Rickey at [facility] that currently working on weaning patient's high O2 needs to then dc to [facility] when medically stable. Currently on 7 L HFNC.   Will continue to provide updates as needed. CSW notes insurance auth may need to be re started depending on continued length of stay in hospital.      Expected Discharge Plan: [place] Barriers to Discharge: Continued Medical Work up  Expected Discharge Plan and Services Expected Discharge Plan: [place] Choice: [place] arrangements for the past 2 months: Single Family Home Expected Discharge Date: 02/05/21                                     Social Determinants of Health (SDOH) Interventions    Readmission Risk Interventions Readmission Risk Prevention Plan 10/30/2020 06/21/2020 09/30/2019  Transportation Screening Complete Complete Complete  PCP or Specialist Appt within 5-7 Days - - -  PCP or Specialist Appt within 3-5 Days - - Complete  Home Care Screening - - -  Medication Review (RN CM) - - -  Social Work Scientific laboratory technician for [place] Planning/Counseling - - Complete  Palliative Care Screening - - Not Applicable  Medication Review Press photographer) Complete Complete Complete  PCP or Specialist appointment within 3-5 days of discharge Complete Complete -  PCP/Specialist Appt Not Complete comments - - -  HRI or Home Care Consult Patient refused Complete -  SW Recovery Care/Counseling Consult Complete Complete -  Palliative Care Screening Not Applicable Not Applicable -  [place] Not Applicable Not Applicable -  Some recent data might be hidden

## 2021-02-10 DIAGNOSIS — J90 Pleural effusion, not elsewhere classified: Secondary | ICD-10-CM | POA: Diagnosis not present

## 2021-02-10 DIAGNOSIS — J9621 Acute and chronic respiratory failure with hypoxia: Secondary | ICD-10-CM | POA: Diagnosis not present

## 2021-02-10 DIAGNOSIS — I5033 Acute on chronic diastolic (congestive) heart failure: Secondary | ICD-10-CM | POA: Diagnosis not present

## 2021-02-10 DIAGNOSIS — N179 Acute kidney failure, unspecified: Secondary | ICD-10-CM | POA: Diagnosis not present

## 2021-02-10 LAB — BASIC METABOLIC PANEL
Anion gap: 7 (ref 5–15)
BUN: 26 mg/dL — ABNORMAL HIGH (ref 8–23)
CO2: 37 mmol/L — ABNORMAL HIGH (ref 22–32)
Calcium: 8.9 mg/dL (ref 8.9–10.3)
Chloride: 89 mmol/L — ABNORMAL LOW (ref 98–111)
Creatinine, Ser: 0.82 mg/dL (ref 0.44–1.00)
GFR, Estimated: >60 mL/min (ref >60)
Glucose, Bld: 220 mg/dL — ABNORMAL HIGH (ref 70–99)
Potassium: 3.7 mmol/L (ref 3.5–5.1)
Sodium: 133 mmol/L — ABNORMAL LOW (ref 135–145)

## 2021-02-10 LAB — GLUCOSE, CAPILLARY
Glucose-Capillary: 229 mg/dL — ABNORMAL HIGH (ref 70–99)
Glucose-Capillary: 262 mg/dL — ABNORMAL HIGH (ref 70–99)
Glucose-Capillary: 402 mg/dL — ABNORMAL HIGH (ref 70–99)
Glucose-Capillary: 284 mg/dL — ABNORMAL HIGH (ref 70–99)

## 2021-02-10 MED ORDER — INSULIN GLARGINE-YFGN 100 UNIT/ML ~~LOC~~ SOLN
34.0000 [IU] | Freq: Every day | SUBCUTANEOUS | Status: DC
Start: 2021-02-11 — End: 2021-02-12
  Administered 2021-02-11 – 2021-02-12 (×2): 34 [IU] via SUBCUTANEOUS
  Filled 2021-02-10 (×2): qty 0.34

## 2021-02-10 NOTE — Progress Notes (Signed)
Progress Note  Patient Name: Karen [name] Date of Encounter: 02/10/2021  [facility] Cardiologist: Ida Rogue, MD   Subjective   I/Os -1.2L overnight. Kidney function stable. Patient reports breathing is better. She is on 4-5L O2. No chest pain.   Inpatient Medications    Scheduled Meds:  albuterol  2.5 mg Nebulization Q6H   allopurinol  100 mg Oral Daily   apixaban  5 mg Oral BID   atorvastatin  10 mg Oral Daily   bethanechol  10 mg Oral TID   ferrous sulfate  325 mg Oral Daily   fluticasone  2 spray Each Nare Daily   fluticasone furoate-vilanterol  1 puff Inhalation Daily   furosemide  40 mg Intravenous BID   insulin aspart  0-20 Units Subcutaneous TID WC   insulin aspart  0-5 Units Subcutaneous QHS   insulin aspart  7 Units Subcutaneous TID WC   insulin glargine-yfgn  30 Units Subcutaneous Daily   methylPREDNISolone (SOLU-MEDROL) injection  40 mg Intravenous Daily   montelukast  10 mg Oral QHS   pantoprazole  40 mg Oral Daily   traZODone  50 mg Oral QHS   umeclidinium bromide  1 puff Inhalation Daily   vitamin B-12  500 mcg Oral Daily   Continuous Infusions:  PRN Meds: acetaminophen, alum & mag hydroxide-simeth, diphenhydrAMINE, guaiFENesin-dextromethorphan, ipratropium-albuterol, ondansetron **OR** ondansetron (ZOFRAN) IV, oxyCODONE   Vital Signs    Vitals:   02/10/21 0424 02/10/21 0600 02/10/21 0747 02/10/21 0801  BP: (!) 147/56  (!) 114/55   Pulse: 85  76   Resp:   17   Temp:   97.6 F (36.4 C)   TempSrc:      SpO2: 94%  90% 92%  Weight:  98.1 kg    Height:        Intake/Output Summary (Last 24 hours) at 02/10/2021 0846 Last data filed at 02/09/2021 1900 Gross per 24 hour  Intake 720 ml  Output 1280 ml  Net -560 ml   Last 3 Weights 02/10/2021 02/09/2021 02/08/2021  Weight (lbs) 216 lb 4.3 oz 214 lb 216 lb 11.4 oz  Weight (kg) 98.1 kg 97.07 kg 98.3 kg      Telemetry    NSR, PACs/PVCs, HR 70-80s - Personally Reviewed  ECG    No new  - Personally Reviewed  Physical Exam   GEN: No acute distress.   Neck: No JVD Cardiac: RRR, no murmurs, rubs, or gallops.  Respiratory: crackles at bases, diminished on the right GI: Soft, nontender, non-distended  MS: trace edema; No deformity. Neuro:  Nonfocal  Psych: Normal affect   Labs    High Sensitivity Troponin:   Recent Labs  Lab 02/01/21 1256 02/01/21 1627  TROPONINIHS 11 10     Chemistry Recent Labs  Lab 02/05/21 0531 02/06/21 1057 02/07/21 0448 02/08/21 0318 02/09/21 0415  NA 136 136 137 135 135  K 3.8 3.8 3.9 3.7 3.7  CL 94* 92* 91* 90* 89*  CO2 33* 38* 37* 39* 38*  GLUCOSE 155* 202* 189* 178* 195*  BUN 18 14 15 17 20   CREATININE 0.66 0.70 0.78 0.70 0.85  CALCIUM 8.6* 8.9 8.8* 8.5* 9.0  MG 2.0 1.8 2.0 1.8 1.8  PROT  --  6.1*  --   --   --   ALBUMIN 3.0* 3.0* 3.1*  --   --   AST  --  12*  --   --   --   ALT  --  9  --   --   --  ALKPHOS  --  75  --   --   --   BILITOT  --  0.8  --   --   --   GFRNONAA >60 >60 >60 >60 >60  ANIONGAP 9 6 9 6 8     Lipids No results for input(s): CHOL, TRIG, HDL, LABVLDL, LDLCALC, CHOLHDL in the last 168 hours.  Hematology Recent Labs  Lab 02/06/21 1057 02/07/21 0448 02/08/21 0318  WBC 9.2 10.7* 10.0  RBC 3.63* 3.66* 3.55*  HGB 9.2* 9.3* 8.8*  HCT 30.0* 30.2* 29.8*  MCV 82.6 82.5 83.9  MCH 25.3* 25.4* 24.8*  MCHC 30.7 30.8 29.5*  RDW 17.6* 17.5* 17.8*  PLT 280 273 255   Thyroid No results for input(s): TSH, FREET4 in the last 168 hours.  BNP Recent Labs  Lab 02/05/21 0531 02/06/21 1057 02/07/21 0448  BNP 302.3* 236.0* 231.6*    DDimer  Recent Labs  Lab 02/08/21 0928  DDIMER 0.77*     Radiology    No results found.  Cardiac Studies   2D echo 02/06/2021: 1. Left ventricular ejection fraction, by estimation, is 60 to 65%. The  left ventricle has normal function. The left ventricle has no regional  wall motion abnormalities. Left ventricular diastolic parameters are  consistent with Grade I  diastolic  dysfunction (impaired relaxation).   2. Right ventricular systolic function is mildly reduced. The right  ventricular size is mildly enlarged. There is severely elevated pulmonary  artery systolic pressure. The estimated right ventricular systolic  pressure is 82.4 mmHg.   3. The mitral valve is normal in structure. Mild to moderate mitral valve  regurgitation. No evidence of mitral stenosis.   4. Tricuspid valve regurgitation is moderate to severe.   5. The aortic valve is normal in structure. Aortic valve regurgitation is  not visualized. Mild aortic valve stenosis. Aortic valve mean gradient  measures 17.0 mmHg. Aortic valve Vmax measures 2.71 m/s.   6. The inferior vena cava is dilated in size with >50% respiratory  variability, suggesting right atrial pressure of 8 mmHg. __________   2D echo 06/2020: 1. Left ventricular ejection fraction, by estimation, is 60 to 65%. The  left ventricle has normal function. There is mild left ventricular  hypertrophy.   2. Right ventricular systolic function is normal. The right ventricular  size is normal.   3. Trivial mitral valve regurgitation.   4. AV is thickened, calcified Difficult to see well Peak and mean  gradients through the vlave are 30 and 15 mm Hg respectively AVA (VTI) is  1.03 cm2. Dimensionless index is 0.44. All consistent with mild to  moderate AS. Compared to echo report from September 28, 2019 no significant change . Aortic valve regurgitation is not  visualized.   5. The inferior vena cava is normal in size with greater than 50%  respiratory variability, suggesting right atrial pressure of 3 mmHg. __________   2D echo 09/2019: 1. Left ventricular ejection fraction, by estimation, is 55 to 60%. The  left ventricle has normal function. The left ventricle has no regional  wall motion abnormalities. There is mild left ventricular hypertrophy.  Left ventricular diastolic parameters  are consistent with Grade I  diastolic dysfunction (impaired relaxation).   2. Right ventricular systolic function is normal. The right ventricular  size is normal. There is severely elevated pulmonary artery systolic  pressure. The estimated right ventricular systolic pressure is 23.5 mmHg.   3. Left atrial size was mildly dilated.   4.  The mitral valve is normal in structure. Trivial mitral valve  regurgitation. No evidence of mitral stenosis.   5. The aortic valve is abnormal. Aortic valve regurgitation is not  visualized. Moderate aortic valve stenosis. Aortic valve area, by VTI  measures 0.94 cm. Aortic valve mean gradient measures 17.3 mmHg. __________   Elwyn Reach patch 02/2019: Normal sinus rhythm with paroxysmal atrial fibrillation   avg HR of 89 bpm.  1 run of Ventricular Tachycardia occurred lasting 4 beats with a max rate of 179 bpm (avg 161 bpm).    Atrial Fibrillation occurred (<1% burden), ranging from 75-179 bpm (avg of 117 bpm), the longest lasting 32 mins 50 secs with an avg rate of 119 bpm.    Isolated SVEs were occasional (1.4%, 25270), SVE Couplets were rare (<1.0%, 2710), and SVE Triplets were rare (<1.0%, 299). Isolated VEs were rare (<1.0%, 617), VE Couplets were rare (<1.0%, 4), and VE Triplets were rare (<1.0%, 1). Ventricular Bigeminy was present.  __________   2D echo 02/2019: 1. Left ventricular ejection fraction, by visual estimation, is 65 to  70%. The left ventricle has normal function. Left ventricular septal wall  thickness was normal. Normal left ventricular posterior wall thickness.  There is no left ventricular  hypertrophy.   2. The left ventricle has no regional wall motion abnormalities.   3. Global right ventricle has normal systolic function.The right  ventricular size is normal. No increase in right ventricular wall  thickness.   4. Left atrial size was normal.   5. Right atrial size was normal.   6. The mitral valve is grossly normal. Trivial mitral valve  regurgitation.    7. The tricuspid valve is grossly normal. Tricuspid valve regurgitation  is trivial.   8. The aortic valve was not well visualized. Aortic valve regurgitation  is not visualized.   9. The pulmonic valve was not well visualized. Pulmonic valve  regurgitation is trivial.  10. The aortic root was not well visualized.  11. Moderately elevated pulmonary artery systolic pressure.  12. The atrial septum is grossly normal. __________   2D echo 08/2018: 1. The left ventricle has low normal systolic function, with an ejection  fraction of 50-55%. The cavity size was normal. There is mild concentric  left ventricular hypertrophy. Left ventricular diastolic Doppler  parameters are indeterminate.   2. The right ventricle has low normal systolic function. The cavity was  mildly enlarged. There is no increase in right ventricular wall thickness.  Right ventricular systolic pressure is moderately elevated with an  estimated pressure of 60.4 mmHg.   3. Left atrial size was mildly dilated.   4. Right atrial size was mildly dilated.   5. The mitral valve is grossly normal.   6. The tricuspid valve is grossly normal. Tricuspid valve regurgitation  is mild-moderate.   7. The aortic valve is grossly normal. Mild thickening of the aortic  valve. Mild calcification of the aortic valve. Aortic valve regurgitation  was not assessed by color flow Doppler. Mild stenosis of the aortic valve.   8. The inferior vena cava was dilated in size with <50% respiratory  variability. __________   2D echo 06/2018:  1. The left ventricle has low normal systolic function, with an ejection  fraction of 50-55%. The cavity size was normal. Left ventricular diastolic  parameters were normal.   2. The right ventricle has normal systolic function. The cavity was  normal. There is no increase in right ventricular wall thickness.   3. Mild  thickening of the mitral valve leaflet.   4. The aortic valve is tricuspid. Moderate  thickening of the aortic  valve. Mild calcification of the aortic valve. Aortic valve regurgitation  is trivial by color flow Doppler. Mild stenosis of the aortic valve.  Patient Profile     75 y.o. female with history of HFpEF, severe pulmonary hypertension, moderate aortic stenosis, junctional bradycardia with BB/CCB, PAF by 03/2019 monitor, chronic chest pain, GI bleed, Covid in 10/2020, DM2, HTN, HLD, chronic hypoxic respiratory failure on supplemental oxygen, COPD, anemia, and obesity who is being seen today for the evaluation of acute on chronic HFpEF/pulmonary hypertension.  Assessment & Plan    Acute on chronic hypoxic respiratory failure - in the setting of acute on chronic HFpEF, severe pulmonary HTN, right sided pleural effusion with underlying COPD and poor pulmonary reserve, AS, anemia, obesity and deconditioning - she is on 3L O2 at baseline - s/p thoracentesis on 12/5 with 600cc fluid removal with cytology pending - on5 L O2, wean O2 as tolerated  Acute on chronic HFpEF/severe pulmonary HTN/pleural effusion - trace edema with crackles on exam - I/Os -1.2L , net -10L - weight down 260>216lbs - s/p thoracentesis as above - continue IV lasix 40mg  BID - kidney function stable  PAF - maintaining NSR - CHADSVASC of 6 - continue Eliquis - no amio with pulmonary disease  Mitral/tricuspid regurgitation - continue with diuresis  Aortic stenosis - mild to moderate by echo this admission - continue OP follow-up  Junctional bradycardia - previously seen while on BB and CCB - telemetry showed NSR with PACS/PVCs and no further junctional bradycardia  HTN - Bps good - PTA meds held  HLD - continue Lipitor 10mg  daily  COPD - per IM  For questions or updates, please contact [facility] Please consult [email or web address] for contact info under        Signed, Cleve Paolillo Ninfa Meeker, PA-C  02/10/2021, 8:46 AM

## 2021-02-10 NOTE — Progress Notes (Signed)
Patient ID: Karen [name], female   DOB: [birth year], 75 y.o.   MRN: [id number] [facility] PROGRESS NOTE  REATA PETROV PJA:[id number] DOB: 08-Aug-1945 DOA: 02/01/2021 PCP: Earlie Counts, FNP  HPI/Subjective: With ambulating today patient dropped her pulse ox down into the 70s.  She was on 6 L at the time.  Down to 4 L at rest.  Still with some shortness of breath.  Admitted 9 days ago with difficulty voiding and shortness of breath.  Objective: Vitals:   02/10/21 1204 02/10/21 1401  BP: (!) 146/50   Pulse: (!) 58   Resp: 20   Temp: 98.6 F (37 C)   SpO2: 91% 91%    Intake/Output Summary (Last 24 hours) at 02/10/2021 1441 Last data filed at 02/10/2021 1300 Gross per 24 hour  Intake 600 ml  Output 730 ml  Net -130 ml   Filed Weights   02/08/21 1014 02/09/21 0402 02/10/21 0600  Weight: 98.3 kg 97.1 kg 98.1 kg    ROS: Review of Systems  Respiratory:  Positive for shortness of breath.   Cardiovascular:  Negative for chest pain.  Gastrointestinal:  Negative for abdominal pain, nausea and vomiting.  Exam: Physical Exam HENT:     Head: Normocephalic.     Mouth/Throat:     Pharynx: No oropharyngeal exudate.  Eyes:     General: Lids are normal.     Conjunctiva/sclera: Conjunctivae normal.  Cardiovascular:     Rate and Rhythm: Normal rate and regular rhythm.     Heart sounds: Normal heart sounds, S1 normal and S2 normal.  Pulmonary:     Breath sounds: Examination of the right-middle field reveals decreased breath sounds. Examination of the left-middle field reveals decreased breath sounds. Examination of the right-lower field reveals decreased breath sounds and rhonchi. Examination of the left-lower field reveals decreased breath sounds and rhonchi. Decreased breath sounds and rhonchi present. No wheezing or rales.  Abdominal:     Palpations: Abdomen is soft.     Tenderness: There is no abdominal tenderness.  Musculoskeletal:     Right lower leg: Swelling present.      Left lower leg: Swelling present.  Skin:    General: Skin is warm.     Findings: No rash.  Neurological:     Mental Status: She is alert and oriented to person, place, and time.      Scheduled Meds:  albuterol  2.5 mg Nebulization Q6H   allopurinol  100 mg Oral Daily   apixaban  5 mg Oral BID   atorvastatin  10 mg Oral Daily   bethanechol  10 mg Oral TID   ferrous sulfate  325 mg Oral Daily   fluticasone  2 spray Each Nare Daily   fluticasone furoate-vilanterol  1 puff Inhalation Daily   furosemide  40 mg Intravenous BID   insulin aspart  0-20 Units Subcutaneous TID WC   insulin aspart  0-5 Units Subcutaneous QHS   insulin aspart  7 Units Subcutaneous TID WC   insulin glargine-yfgn  30 Units Subcutaneous Daily   methylPREDNISolone (SOLU-MEDROL) injection  40 mg Intravenous Daily   montelukast  10 mg Oral QHS   pantoprazole  40 mg Oral Daily   traZODone  50 mg Oral QHS   umeclidinium bromide  1 puff Inhalation Daily   vitamin B-12  500 mcg Oral Daily     Assessment/Plan:  Acute on chronic hypoxic respiratory failure with COPD exacerbation.  Desaturated down to mid 70's with ambulating on  6 L.  At rest down to 4 L.  Will need to hold her sats with ambulation prior to disposition.  A little bit better air entry today with starting of Solu-Medrol yesterday.  Continue nebulizer treatments. Acute on chronic diastolic congestive heart failure and pulmonary hypertension.  Moderate to severe tricuspid regurgitation mild to moderate mitral regurgitation.  Patient on IV Lasix as per cardiology Acute kidney injury on chronic kidney disease stage II.  Creatinine peaked at 1.28 and is 0.82 today. Right pleural effusion status post thoracentesis on 02/07/2021 with 600 mL removed Type 2 diabetes mellitus with hyperlipidemia.  Increase Semglee insulin to 34 units daily and atorvastatin 10 mg daily. Paroxysmal atrial fibrillation on Eliquis for anticoagulation Acute metabolic  encephalopathy has resolved UTI secondary to E. coli and Klebsiella.  Completed antibiotics Weakness.  Physical therapy recommended rehab Obesity with a BMI of 38.31     Code Status:     Code Status Orders  (From admission, onward)           Start     Ordered   02/03/21 1806  Do not attempt resuscitation (DNR)  Continuous       Question Answer Comment  In the event of cardiac or respiratory ARREST Do not call a "code blue"   In the event of cardiac or respiratory ARREST Do not perform Intubation, CPR, defibrillation or ACLS   In the event of cardiac or respiratory ARREST Use medication by any route, position, wound care, and other measures to relive pain and suffering. May use oxygen, suction and manual treatment of airway obstruction as needed for comfort.      02/03/21 1805           Code Status History     Date Active Date Inactive Code Status Order ID Comments User Context   02/01/2021 1653 02/03/2021 1805 Full Code [id number]  Collier Bullock, MD ED   10/29/2020 1650 11/09/2020 1747 Full Code [id number]  Cox, [place], DO ED   06/17/2020 2215 06/21/2020 2140 Full Code [id number]  Mansy, Arvella Merles, MD ED   05/21/2020 2251 05/25/2020 1950 Full Code [id number]  Athena Masse, MD Inpatient   09/28/2019 0120 10/02/2019 2116 DNR [id number]  Sueanne Margarita, [place] [facility]   06/28/2019 1333 06/30/2019 1619 DNR [id number]  Ivor Costa, MD Inpatient   06/28/2019 1255 06/28/2019 1333 DNR [id number]  Ivor Costa, MD Inpatient   02/23/2019 1938 02/27/2019 1810 Full Code [id number]  Edwin Dada, MD Inpatient   02/13/2019 2011 02/16/2019 1819 DNR [id number]  Nicolette Bang, DO ED   09/14/2018 2220 09/17/2018 2350 DNR [id number]  Henreitta Leber, MD Inpatient   08/05/2018 1547 08/10/2018 1949 Full Code [id number]  Gladstone Lighter, MD Inpatient   06/25/2018 1417 06/30/2018 1557 Full Code [id number]  Epifanio Lesches, MD ED   09/15/2017 1747 09/18/2017 2106 DNR [id number]  Hillary Bow, MD ED    08/26/2017 2216 08/30/2017 1604 Full Code [id number]  Nicholes Mango, MD Inpatient   01/08/2015 0307 01/09/2015 1336 Full Code [id number]  Theodoro Grist, MD Inpatient      Family Communication: Updated husband on the phone Disposition Plan: Status is: Inpatient  Consultants: Cardiology  Time spent: 27 minutes  [place]

## 2021-02-10 NOTE — Progress Notes (Addendum)
Mobility Specialist - Progress Note   02/10/21 1000  Mobility  Activity Ambulated in room  Level of Assistance Standby assist, set-up cues, supervision of patient - no hands on  Assistive Device Front wheel walker  Distance Ambulated (ft) 12 ft  Mobility Ambulated with assistance in room  Mobility Response Tolerated well  Mobility performed by Mobility specialist  $Mobility charge 1 Mobility    Pre-mobility: 69 HR, 96% SpO2 During mobility: 94 HR, 74-83% SpO2 Post-mobility: 78 HR, 93% SpO2   Pt lying in bed upon arrival, utilizing 4L. O2 increased to 6L for OOB activity. Extra time for bed mobility with heavy assist on rails. MinA STS. Desat to mid 70s-low 65s with ambulation, denied SOB. 1 seated rest break taken d/t fatigue. Pt declined transfer to recliner. Bed linen change d/t soiled sheets. Pt returned to bed, back on 4L. RN notified.    Kathee Delton Mobility Specialist 02/10/21, 10:28 AM

## 2021-02-11 DIAGNOSIS — R188 Other ascites: Secondary | ICD-10-CM

## 2021-02-11 DIAGNOSIS — J441 Chronic obstructive pulmonary disease with (acute) exacerbation: Secondary | ICD-10-CM

## 2021-02-11 DIAGNOSIS — R140 Abdominal distension (gaseous): Secondary | ICD-10-CM

## 2021-02-11 LAB — RESP PANEL BY RT-PCR (FLU A&B, COVID) ARPGX2
Influenza A by PCR: NEGATIVE
Influenza B by PCR: NEGATIVE
SARS Coronavirus 2 by RT PCR: NEGATIVE

## 2021-02-11 LAB — BASIC METABOLIC PANEL
Anion gap: 9 (ref 5–15)
BUN: 38 mg/dL — ABNORMAL HIGH (ref 8–23)
CO2: 35 mmol/L — ABNORMAL HIGH (ref 22–32)
Calcium: 9.2 mg/dL (ref 8.9–10.3)
Chloride: 90 mmol/L — ABNORMAL LOW (ref 98–111)
Creatinine, Ser: 1.11 mg/dL — ABNORMAL HIGH (ref 0.44–1.00)
GFR, Estimated: 52 mL/min — ABNORMAL LOW (ref >60)
Glucose, Bld: 239 mg/dL — ABNORMAL HIGH (ref 70–99)
Potassium: 3.7 mmol/L (ref 3.5–5.1)
Sodium: 134 mmol/L — ABNORMAL LOW (ref 135–145)

## 2021-02-11 LAB — BODY FLUID CULTURE W GRAM STAIN: Culture: NO GROWTH

## 2021-02-11 LAB — GLUCOSE, CAPILLARY
Glucose-Capillary: 216 mg/dL — ABNORMAL HIGH (ref 70–99)
Glucose-Capillary: 349 mg/dL — ABNORMAL HIGH (ref 70–99)
Glucose-Capillary: 374 mg/dL — ABNORMAL HIGH (ref 70–99)
Glucose-Capillary: 330 mg/dL — ABNORMAL HIGH (ref 70–99)

## 2021-02-11 MED ORDER — ALBUTEROL SULFATE (2.5 MG/3ML) 0.083% IN NEBU
2.5000 mg | INHALATION_SOLUTION | Freq: Three times a day (TID) | RESPIRATORY_TRACT | Status: DC
  Administered 2021-02-11 – 2021-02-12 (×3): 2.5 mg via RESPIRATORY_TRACT
  Filled 2021-02-11 (×4): qty 3

## 2021-02-11 MED ORDER — INSULIN ASPART 100 UNIT/ML IJ SOLN
10.0000 [IU] | Freq: Three times a day (TID) | INTRAMUSCULAR | Status: DC
  Administered 2021-02-11 – 2021-02-12 (×4): 10 [IU] via SUBCUTANEOUS
  Filled 2021-02-11 (×4): qty 1

## 2021-02-11 NOTE — Progress Notes (Signed)
Progress Note  Patient Name: Karen [name] Date of Encounter: 02/11/2021  [facility] Cardiologist: Ida Rogue, MD   Subjective   Kidney function up this AM.The patient is on 4L O2. Feels breathing is bout the same. No chest pain.   Inpatient Medications    Scheduled Meds:  albuterol  2.5 mg Nebulization Q6H   allopurinol  100 mg Oral Daily   apixaban  5 mg Oral BID   atorvastatin  10 mg Oral Daily   bethanechol  10 mg Oral TID   ferrous sulfate  325 mg Oral Daily   fluticasone  2 spray Each Nare Daily   fluticasone furoate-vilanterol  1 puff Inhalation Daily   furosemide  40 mg Intravenous BID   insulin aspart  0-20 Units Subcutaneous TID WC   insulin aspart  0-5 Units Subcutaneous QHS   insulin aspart  7 Units Subcutaneous TID WC   insulin glargine-yfgn  34 Units Subcutaneous Daily   methylPREDNISolone (SOLU-MEDROL) injection  40 mg Intravenous Daily   montelukast  10 mg Oral QHS   pantoprazole  40 mg Oral Daily   traZODone  50 mg Oral QHS   umeclidinium bromide  1 puff Inhalation Daily   vitamin B-12  500 mcg Oral Daily   Continuous Infusions:  PRN Meds: acetaminophen, alum & mag hydroxide-simeth, diphenhydrAMINE, guaiFENesin-dextromethorphan, ipratropium-albuterol, ondansetron **OR** ondansetron (ZOFRAN) IV, oxyCODONE   Vital Signs    Vitals:   02/10/21 2041 02/10/21 2342 02/11/21 0441 02/11/21 0818  BP:  (!) 118/56 (!) 116/51 138/60  Pulse:  (!) 55 75 71  Resp:  20 18 18   Temp:  97.9 F (36.6 C) 97.6 F (36.4 C) 98 F (36.7 C)  TempSrc:  Oral Oral   SpO2: 97% 97% (!) 87% 100%  Weight:      Height:        Intake/Output Summary (Last 24 hours) at 02/11/2021 0853 Last data filed at 02/10/2021 1700 Gross per 24 hour  Intake 600 ml  Output --  Net 600 ml   Last 3 Weights 02/10/2021 02/09/2021 02/08/2021  Weight (lbs) 216 lb 4.3 oz 214 lb 216 lb 11.4 oz  Weight (kg) 98.1 kg 97.07 kg 98.3 kg      Telemetry    NSR HR 70s - Personally  Reviewed  ECG    No new - Personally Reviewed  Physical Exam   GEN: No acute distress.   Neck: No JVD Cardiac: RRR, + murmur, no rubs, or gallops.  Respiratory: Clear to auscultation bilaterally. GI: Soft, nontender, non-distended  MS: No edema; No deformity. Neuro:  Nonfocal  Psych: Normal affect   Labs    High Sensitivity Troponin:   Recent Labs  Lab 02/01/21 1256 02/01/21 1627  TROPONINIHS 11 10     Chemistry Recent Labs  Lab 02/05/21 0531 02/06/21 1057 02/07/21 0448 02/08/21 0318 02/09/21 0415 02/10/21 0807 02/11/21 0757  NA 136 136 137 135 135 133* 134*  K 3.8 3.8 3.9 3.7 3.7 3.7 3.7  CL 94* 92* 91* 90* 89* 89* 90*  CO2 33* 38* 37* 39* 38* 37* 35*  GLUCOSE 155* 202* 189* 178* 195* 220* 239*  BUN 18 14 15 17 20  26* 38*  CREATININE 0.66 0.70 0.78 0.70 0.85 0.82 1.11*  CALCIUM 8.6* 8.9 8.8* 8.5* 9.0 8.9 9.2  MG 2.0 1.8 2.0 1.8 1.8  --   --   PROT  --  6.1*  --   --   --   --   --  ALBUMIN 3.0* 3.0* 3.1*  --   --   --   --   AST  --  12*  --   --   --   --   --   ALT  --  9  --   --   --   --   --   ALKPHOS  --  75  --   --   --   --   --   BILITOT  --  0.8  --   --   --   --   --   GFRNONAA >60 >60 >60 >60 >60 >60 52*  ANIONGAP 9 6 9 6 8 7 9     Lipids No results for input(s): CHOL, TRIG, HDL, LABVLDL, LDLCALC, CHOLHDL in the last 168 hours.  Hematology Recent Labs  Lab 02/06/21 1057 02/07/21 0448 02/08/21 0318  WBC 9.2 10.7* 10.0  RBC 3.63* 3.66* 3.55*  HGB 9.2* 9.3* 8.8*  HCT 30.0* 30.2* 29.8*  MCV 82.6 82.5 83.9  MCH 25.3* 25.4* 24.8*  MCHC 30.7 30.8 29.5*  RDW 17.6* 17.5* 17.8*  PLT 280 273 255   Thyroid No results for input(s): TSH, FREET4 in the last 168 hours.  BNP Recent Labs  Lab 02/05/21 0531 02/06/21 1057 02/07/21 0448  BNP 302.3* 236.0* 231.6*    DDimer  Recent Labs  Lab 02/08/21 0928  DDIMER 0.77*     Radiology    No results found.  Cardiac Studies   2D echo 02/06/2021: 1. Left ventricular ejection fraction,  by estimation, is 60 to 65%. The  left ventricle has normal function. The left ventricle has no regional  wall motion abnormalities. Left ventricular diastolic parameters are  consistent with Grade I diastolic  dysfunction (impaired relaxation).   2. Right ventricular systolic function is mildly reduced. The right  ventricular size is mildly enlarged. There is severely elevated pulmonary  artery systolic pressure. The estimated right ventricular systolic  pressure is 16.3 mmHg.   3. The mitral valve is normal in structure. Mild to moderate mitral valve  regurgitation. No evidence of mitral stenosis.   4. Tricuspid valve regurgitation is moderate to severe.   5. The aortic valve is normal in structure. Aortic valve regurgitation is  not visualized. Mild aortic valve stenosis. Aortic valve mean gradient  measures 17.0 mmHg. Aortic valve Vmax measures 2.71 m/s.   6. The inferior vena cava is dilated in size with >50% respiratory  variability, suggesting right atrial pressure of 8 mmHg. __________   2D echo 06/2020: 1. Left ventricular ejection fraction, by estimation, is 60 to 65%. The  left ventricle has normal function. There is mild left ventricular  hypertrophy.   2. Right ventricular systolic function is normal. The right ventricular  size is normal.   3. Trivial mitral valve regurgitation.   4. AV is thickened, calcified Difficult to see well Peak and mean  gradients through the vlave are 30 and 15 mm Hg respectively AVA (VTI) is  1.03 cm2. Dimensionless index is 0.44. All consistent with mild to  moderate AS. Compared to echo report from September 28, 2019 no significant change . Aortic valve regurgitation is not  visualized.   5. The inferior vena cava is normal in size with greater than 50%  respiratory variability, suggesting right atrial pressure of 3 mmHg. __________   2D echo 09/2019: 1. Left ventricular ejection fraction, by estimation, is 55 to 60%. The  left ventricle  has normal function. The left  ventricle has no regional  wall motion abnormalities. There is mild left ventricular hypertrophy.  Left ventricular diastolic parameters  are consistent with Grade I diastolic dysfunction (impaired relaxation).   2. Right ventricular systolic function is normal. The right ventricular  size is normal. There is severely elevated pulmonary artery systolic  pressure. The estimated right ventricular systolic pressure is 24.2 mmHg.   3. Left atrial size was mildly dilated.   4. The mitral valve is normal in structure. Trivial mitral valve  regurgitation. No evidence of mitral stenosis.   5. The aortic valve is abnormal. Aortic valve regurgitation is not  visualized. Moderate aortic valve stenosis. Aortic valve area, by VTI  measures 0.94 cm. Aortic valve mean gradient measures 17.3 mmHg. __________   Elwyn Reach patch 02/2019: Normal sinus rhythm with paroxysmal atrial fibrillation   avg HR of 89 bpm.  1 run of Ventricular Tachycardia occurred lasting 4 beats with a max rate of 179 bpm (avg 161 bpm).    Atrial Fibrillation occurred (<1% burden), ranging from 75-179 bpm (avg of 117 bpm), the longest lasting 32 mins 50 secs with an avg rate of 119 bpm.    Isolated SVEs were occasional (1.4%, 25270), SVE Couplets were rare (<1.0%, 2710), and SVE Triplets were rare (<1.0%, 299). Isolated VEs were rare (<1.0%, 617), VE Couplets were rare (<1.0%, 4), and VE Triplets were rare (<1.0%, 1). Ventricular Bigeminy was present.  __________   2D echo 02/2019: 1. Left ventricular ejection fraction, by visual estimation, is 65 to  70%. The left ventricle has normal function. Left ventricular septal wall  thickness was normal. Normal left ventricular posterior wall thickness.  There is no left ventricular  hypertrophy.   2. The left ventricle has no regional wall motion abnormalities.   3. Global right ventricle has normal systolic function.The right  ventricular size is normal. No  increase in right ventricular wall  thickness.   4. Left atrial size was normal.   5. Right atrial size was normal.   6. The mitral valve is grossly normal. Trivial mitral valve  regurgitation.   7. The tricuspid valve is grossly normal. Tricuspid valve regurgitation  is trivial.   8. The aortic valve was not well visualized. Aortic valve regurgitation  is not visualized.   9. The pulmonic valve was not well visualized. Pulmonic valve  regurgitation is trivial.  10. The aortic root was not well visualized.  11. Moderately elevated pulmonary artery systolic pressure.  12. The atrial septum is grossly normal. __________   2D echo 08/2018: 1. The left ventricle has low normal systolic function, with an ejection  fraction of 50-55%. The cavity size was normal. There is mild concentric  left ventricular hypertrophy. Left ventricular diastolic Doppler  parameters are indeterminate.   2. The right ventricle has low normal systolic function. The cavity was  mildly enlarged. There is no increase in right ventricular wall thickness.  Right ventricular systolic pressure is moderately elevated with an  estimated pressure of 60.4 mmHg.   3. Left atrial size was mildly dilated.   4. Right atrial size was mildly dilated.   5. The mitral valve is grossly normal.   6. The tricuspid valve is grossly normal. Tricuspid valve regurgitation  is mild-moderate.   7. The aortic valve is grossly normal. Mild thickening of the aortic  valve. Mild calcification of the aortic valve. Aortic valve regurgitation  was not assessed by color flow Doppler. Mild stenosis of the aortic valve.   8. The  inferior vena cava was dilated in size with <50% respiratory  variability. __________   2D echo 06/2018:  1. The left ventricle has low normal systolic function, with an ejection  fraction of 50-55%. The cavity size was normal. Left ventricular diastolic  parameters were normal.   2. The right ventricle has normal  systolic function. The cavity was  normal. There is no increase in right ventricular wall thickness.   3. Mild thickening of the mitral valve leaflet.   4. The aortic valve is tricuspid. Moderate thickening of the aortic  valve. Mild calcification of the aortic valve. Aortic valve regurgitation  is trivial by color flow Doppler. Mild stenosis of the aortic valve.    Patient Profile     75 y.o. female with history of HFpEF, severe pulmonary hypertension, moderate aortic stenosis, junctional bradycardia with BB/CCB, PAF by 03/2019 monitor, chronic chest pain, GI bleed, Covid in 10/2020, DM2, HTN, HLD, chronic hypoxic respiratory failure on supplemental oxygen, COPD, anemia, and obesity who is being seen today for the evaluation of acute on chronic HFpEF/pulmonary hypertension.  Assessment & Plan    Acute on chronic hypoxic respiratory failure - in the setting of acute on chronic HFpEF, severe pulmonary HTN, right sided pleural effusion with underlying COPD and poor pulmonary reserve, AS, anemia, obesity and deconditioning - she is on 3L O2 at baseline - s/p thoracentesis on 12/5 with 600cc fluid removal with cytology pending - on4 L O2, wean O2 as tolerated   Acute on chronic HFpEF/severe pulmonary HTN/pleural effusion - volume status improved with IV lasix - I/Os not -9.9L. 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

## 2021-02-11 NOTE — Progress Notes (Signed)
Patient ID: Karen [name], female   DOB: 02-10-1946, 75 y.o.   MRN: [id number] [facility] PROGRESS NOTE  Karen [name] ZHG:[id number] DOB: 22-Dec-1945 DOA: 02/01/2021 PCP: Earlie Counts, FNP  HPI/Subjective: Patient feeling okay today.  Feels that her breathing is better.  No complaints of shortness of breath or cough.  Admitted 10 days ago with difficulty voiding and shortness of breath.  Objective: Vitals:   02/11/21 1354 02/11/21 1555  BP:  128/61  Pulse:  (!) 53  Resp:  18  Temp:  97.9 F (36.6 C)  SpO2: 96% 99%    Intake/Output Summary (Last 24 hours) at 02/11/2021 1608 Last data filed at 02/11/2021 1011 Gross per 24 hour  Intake 500 ml  Output --  Net 500 ml   Filed Weights   02/08/21 1014 02/09/21 0402 02/10/21 0600  Weight: 98.3 kg 97.1 kg 98.1 kg    ROS: Review of Systems  Respiratory:  Negative for shortness of breath.   Cardiovascular:  Negative for chest pain.  Gastrointestinal:  Negative for abdominal pain, nausea and vomiting.  Exam: Physical Exam HENT:     Head: Normocephalic.     Mouth/Throat:     Pharynx: No oropharyngeal exudate.  Eyes:     General: Lids are normal.     Conjunctiva/sclera: Conjunctivae normal.  Cardiovascular:     Rate and Rhythm: Normal rate and regular rhythm.     Heart sounds: Normal heart sounds, S1 normal and S2 normal.  Pulmonary:     Breath sounds: Examination of the right-lower field reveals decreased breath sounds. Examination of the left-lower field reveals decreased breath sounds. Decreased breath sounds present. No wheezing, rhonchi or rales.  Abdominal:     Palpations: Abdomen is soft.     Tenderness: There is no abdominal tenderness.  Musculoskeletal:     Right lower leg: Swelling present.     Left lower leg: Swelling present.  Skin:    General: Skin is warm.     Findings: No rash.  Neurological:     Mental Status: She is alert and oriented to person, place, and time.      Scheduled Meds:   albuterol  2.5 mg Nebulization TID   allopurinol  100 mg Oral Daily   apixaban  5 mg Oral BID   atorvastatin  10 mg Oral Daily   bethanechol  10 mg Oral TID   ferrous sulfate  325 mg Oral Daily   fluticasone  2 spray Each Nare Daily   fluticasone furoate-vilanterol  1 puff Inhalation Daily   insulin aspart  0-20 Units Subcutaneous TID WC   insulin aspart  0-5 Units Subcutaneous QHS   insulin aspart  10 Units Subcutaneous TID WC   insulin glargine-yfgn  34 Units Subcutaneous Daily   methylPREDNISolone (SOLU-MEDROL) injection  40 mg Intravenous Daily   montelukast  10 mg Oral QHS   pantoprazole  40 mg Oral Daily   traZODone  50 mg Oral QHS   umeclidinium bromide  1 puff Inhalation Daily   vitamin B-12  500 mcg Oral Daily    Assessment/Plan:  Acute on chronic hypoxic respiratory failure with COPD exacerbation.  This morning desaturated in the mid 80s with walking with 3 L.  This afternoon was able to hold her saturations with ambulation with the nursing staff.  Continue Solu-Medrol and nebulizer treatments.  Checked with transitional care team and patient should be able to go out to rehab tomorrow. Acute on chronic diastolic congestive heart failure  and pulmonary hypertension.  Moderate to severe tricuspid regurgitation and mild to moderate mitral regurgitation.  Patient was diuresed and since creatinine did creep up a little bit Lasix held today.  Recheck creatinine tomorrow. Acute kidney injury on chronic kidney disease stage IIIa.  Creatinine peaked at 1.28 and was as good as 0.82.  Creatinine up at 1.11 today.  Check BMP tomorrow with holding Lasix today. Type 2 diabetes mellitus with hyperlipidemia.  Continue Semglee insulin 34 units daily and increase short acting insulin prior to meals.  Continue atorvastatin. Right pleural effusion status postthoracentesis on 02/07/2021 with 600 mL removed Paroxysmal atrial fibrillation on Eliquis for anticoagulation Acute metabolic encephalopathy has  resolved UTI with E. coli and Klebsiella.  Fully treated here Weakness.  Physical therapy recommends rehab Obesity with a BMI of 38.31      Code Status:     Code Status Orders  (From admission, onward)           Start     Ordered   02/03/21 1806  Do not attempt resuscitation (DNR)  Continuous       Question Answer Comment  In the event of cardiac or respiratory ARREST Do not call a "code blue"   In the event of cardiac or respiratory ARREST Do not perform Intubation, CPR, defibrillation or ACLS   In the event of cardiac or respiratory ARREST Use medication by any route, position, wound care, and other measures to relive pain and suffering. May use oxygen, suction and manual treatment of airway obstruction as needed for comfort.      02/03/21 1805           Code Status History     Date Active Date Inactive Code Status Order ID Comments User Context   02/01/2021 1653 02/03/2021 1805 Full Code [id number]  Collier Bullock, MD ED   10/29/2020 1650 11/09/2020 1747 Full Code [id number]  Cox, [place], DO ED   06/17/2020 2215 06/21/2020 2140 Full Code [id number]  Mansy, Arvella Merles, MD ED   05/21/2020 2251 05/25/2020 1950 Full Code [id number]  Athena Masse, MD Inpatient   09/28/2019 0120 10/02/2019 2116 DNR [id number]  Sueanne Margarita, [place] ED   06/28/2019 1333 06/30/2019 1619 DNR [id number]  Ivor Costa, MD Inpatient   06/28/2019 1255 06/28/2019 1333 DNR [id number]  Ivor Costa, MD Inpatient   02/23/2019 1938 02/27/2019 1810 Full Code [id number]  Edwin Dada, MD Inpatient   02/13/2019 2011 02/16/2019 1819 DNR [id number]  Nicolette Bang, DO ED   09/14/2018 2220 09/17/2018 2350 DNR [id number]  Henreitta Leber, MD Inpatient   08/05/2018 1547 08/10/2018 1949 Full Code [id number]  Gladstone Lighter, MD Inpatient   06/25/2018 1417 06/30/2018 1557 Full Code [id number]  Epifanio Lesches, MD ED   09/15/2017 1747 09/18/2017 2106 DNR [id number]  Hillary Bow, MD ED   08/26/2017 2216 08/30/2017 1604 Full  Code [id number]  Nicholes Mango, MD Inpatient   01/08/2015 0307 01/09/2015 1336 Full Code [id number]  Theodoro Grist, MD Inpatient      Family Communication: Updated husband on the phone Disposition Plan: Status is: Inpatient.  Potential disposition out to rehab tomorrow  Time spent: 27 minutes  [place]

## 2021-02-11 NOTE — Progress Notes (Signed)
Physical Therapy Treatment Patient Details Name: Karen [name] MRN: [id number] DOB: Dec 14, 1945 Today's Date: 02/11/2021   History of Present Illness 75 year old F with PMH of COPD, chronic hypoxic RF on 3 L, diastolic CHF, moderate aortic stenosis, A. fib, DM-2 and morbid obesity presenting with decreased urine output, SOB, DOE, orthopnea, edema, chest pressure, abdominal pain, distention and diarrhea, and admitted with working diagnosis of acute diastolic CHF.    PT Comments    Pt seen for PT tx with pt agreeable. Pt is able to complete bed mobility without physical assistance but reliance on hospital bed features. Pt is able to progress to performing transfers & gait with CGA fade to supervision. Pt ambulates short distances in room with RW & decreased gait speed, with progressive forward lean on RW. Pt on 3L/min via nasal cannula throughout session with SpO2 dropping to 85% after first gait, down to 82% after 2nd gait. Pt with poor return demo of pursed lip breathing but able to recover to >/= 90% with extra time & seated rest break. Will continue to follow pt acutely to increase independence with gait & transfers with LRAD.   Recommendations for follow up therapy are one component of a multi-disciplinary discharge planning process, led by the attending physician.  Recommendations may be updated based on patient status, additional functional criteria and insurance authorization.  Follow Up Recommendations  Skilled nursing-short term rehab (<3 hours/day)     Assistance Recommended at Discharge Intermittent Supervision/Assistance  Equipment Recommendations  None recommended by PT    Recommendations for Other Services       Precautions / Restrictions Precautions Precautions: Fall Restrictions Weight Bearing Restrictions: No     Mobility  Bed Mobility Overal bed mobility: Needs Assistance Bed Mobility: Supine to Sit     Supine to sit: Supervision;HOB elevated (bed rails)           Transfers Overall transfer level: Needs assistance Equipment used: Rolling walker (2 wheels) Transfers: Sit to/from Stand Sit to Stand: Min guard           General transfer comment: extra time to power up to standing    Ambulation/Gait Ambulation/Gait assistance: Min guard;Supervision (CGA fade to supervision) Gait Distance (Feet): 18 Feet (+ 12 ft) Assistive device: Rolling walker (2 wheels) Gait Pattern/deviations: Step-to pattern;Trunk flexed;Narrow base of support;Decreased stride length Gait velocity: decreased         Stairs             Wheelchair Mobility    Modified Rankin (Stroke Patients Only)       Balance Overall balance assessment: Needs assistance Sitting-balance support: Feet supported Sitting balance-Leahy Scale: Good     Standing balance support: During functional activity;Bilateral upper extremity supported Standing balance-Leahy Scale: Fair Standing balance comment: BUE support on RW                            Cognition Arousal/Alertness: Awake/alert Behavior During Therapy: WFL for tasks assessed/performed;Flat affect Overall Cognitive Status: Within Functional Limits for tasks assessed                                          Exercises      General Comments General comments (skin integrity, edema, etc.): Spots of blood noted on chuck pad when pt transferred OOB but none observed on pt - nurse made aware.  Pertinent Vitals/Pain Pain Assessment: No/denies pain    Home Living Family/patient expects to be discharged to:: Private residence                        Prior Function            PT Goals (current goals can now be found in the care plan section) Acute Rehab PT Goals Patient Stated Goal: [facility] PT Goal Formulation: With patient Time For Goal Achievement: 02/16/21 Potential to Achieve Goals: Good Additional Goals Additional Goal #1: Pt will be able to  perform bed mobility/transfers with supervision and LRAD in order to improve functional indep. Progress towards PT goals: Progressing toward goals    Frequency    Min 2X/week      PT Plan Current plan remains appropriate    Co-evaluation              AM-PAC PT "6 Clicks" Mobility   Outcome Measure  Help needed turning from your back to your side while in a flat bed without using bedrails?: A Little Help needed moving from lying on your back to sitting on the side of a flat bed without using bedrails?: A Little Help needed moving to and from a bed to a chair (including a wheelchair)?: A Little Help needed standing up from a chair using your arms (e.g., wheelchair or bedside chair)?: A Little Help needed to walk in hospital room?: A Little Help needed climbing 3-5 steps with a railing? : A Lot 6 Click Score: 17    End of Session Equipment Utilized During Treatment: Oxygen Activity Tolerance: Patient tolerated treatment well Patient left: in chair;with call bell/phone within reach;with family/visitor present Nurse Communication: Mobility status (O2) PT Visit Diagnosis: Muscle weakness (generalized) (M62.81);Unsteadiness on feet (R26.81);Difficulty in walking, not elsewhere classified (R26.2)     Time: 0370-4888 PT Time Calculation (min) (ACUTE ONLY): 25 min  Charges:  $Therapeutic Activity: 23-37 mins                     Lavone Nian, PT, DPT 02/11/21, 1:10 PM    Waunita Schooner 02/11/2021, 1:07 PM

## 2021-02-11 NOTE — Progress Notes (Signed)
Inpatient Diabetes Program Recommendations  AACE/ADA: New Consensus Statement on Inpatient Glycemic Control (2015)  Target Ranges:  Prepandial:   less than 140 mg/dL      Peak postprandial:   less than 180 mg/dL (1-2 hours)      Critically ill patients:  140 - 180 mg/dL   Lab Results  Component Value Date   GLUCAP 216 (H) 02/11/2021   HGBA1C 9.8 (H) 02/03/2021    Review of Glycemic Control  Latest Reference Range & Units 02/09/21 07:54 02/09/21 11:23 02/09/21 15:34 02/10/21 07:43 02/10/21 12:03 02/10/21 16:19 02/10/21 20:30 02/11/21 08:19  Glucose-Capillary 70 - 99 mg/dL 196 (H) 264 (H) 313 (H) 229 (H) 262 (H) 402 (H) 284 (H) 216 (H)  (H): Data is abnormally high Inpatient Diabetes Program Recommendations:    Postprandial CBGs elevated >180. Please consider: -Increase Novolog meal coverage to 10 units tid if eats 50%  Thank you, Nani Gasser. Luwanna Brossman, RN, MSN, CDE  Diabetes Coordinator Inpatient Glycemic Control Team Team Pager [phone] (8am-5pm) 02/11/2021 9:51 AM

## 2021-02-11 NOTE — TOC Progression Note (Signed)
Transition of Care [facility]) - Progression Note    Patient Details  Name: Karen [name] MRN: [id number] Date of Birth: Oct 23, 1945  [facility]) CM/SW [place], [place] Phone Number: 02/11/2021, 9:42 AM  Clinical Narrative:     CSW notes patient has bed at [facility] pending medical readiness to discharge.   Ricky at [employer] updated today.   Expected Discharge Plan: Skilled Nursing Facility Barriers to Discharge: Continued Medical Work up  Expected Discharge Plan and Services Expected Discharge Plan: [place] Choice: [place] arrangements for the past 2 months: Single Family Home Expected Discharge Date: 02/05/21                                     Social Determinants of Health (SDOH) Interventions    Readmission Risk Interventions Readmission Risk Prevention Plan 10/30/2020 06/21/2020 09/30/2019  Transportation Screening Complete Complete Complete  PCP or Specialist Appt within 5-7 Days - - -  PCP or Specialist Appt within 3-5 Days - - Complete  Home Care Screening - - -  Medication Review (RN CM) - - -  Social Work Scientific laboratory technician for [place] Planning/Counseling - - Complete  Palliative Care Screening - - Not Applicable  Medication Review Press photographer) Complete Complete Complete  PCP or Specialist appointment within 3-5 days of discharge Complete Complete -  PCP/Specialist Appt Not Complete comments - - -  HRI or Home Care Consult Patient refused Complete -  SW Recovery Care/Counseling Consult Complete Complete -  Palliative Care Screening Not Applicable Not Applicable -  [place] Not Applicable Not Applicable -  Some recent data might be hidden

## 2021-02-11 NOTE — Care Management Important Message (Signed)
Important Message  Patient Details  Name: Karen [name] MRN: [id number] Date of Birth: 07/16/1945   Medicare Important Message Given:  Yes     [name] 02/11/2021, 12:45 PM

## 2021-02-11 NOTE — TOC Progression Note (Signed)
Transition of Care [facility]) - Progression Note    Patient Details  Name: ZAIDA REILAND MRN: [id number] Date of Birth: 06-09-1945  Transition of Care [facility]) CM/SW [place], [place] Phone Number: 02/11/2021, 4:03 PM  Clinical Narrative:     [facility] please fax dc summary at [phone] per Josh at [place].  Auth re started in [place] portal, anticipate to have auth if not today by tomorrow. Weekend staff to follow up in [place] portal for auth approval prior to discharging patient to [facility].  Expected Discharge Plan: Skilled Nursing Facility Barriers to Discharge: Continued Medical Work up  Expected Discharge Plan and Services Expected Discharge Plan: [place] Choice: [place] arrangements for the past 2 months: Single Family Home Expected Discharge Date: 02/05/21                                     Social Determinants of Health (SDOH) Interventions    Readmission Risk Interventions Readmission Risk Prevention Plan 10/30/2020 06/21/2020 09/30/2019  Transportation Screening Complete Complete Complete  PCP or Specialist Appt within 5-7 Days - - -  PCP or Specialist Appt within 3-5 Days - - Complete  Home Care Screening - - -  Medication Review (RN CM) - - -  Social Work Scientific laboratory technician for [place] Planning/Counseling - - Complete  Palliative Care Screening - - Not Applicable  Medication Review Press photographer) Complete Complete Complete  PCP or Specialist appointment within 3-5 days of discharge Complete Complete -  PCP/Specialist Appt Not Complete comments - - -  HRI or Home Care Consult Patient refused Complete -  SW Recovery Care/Counseling Consult Complete Complete -  Palliative Care Screening Not Applicable Not Applicable -  [place] Not Applicable Not Applicable -  Some recent data might be hidden

## 2021-02-11 NOTE — Plan of Care (Signed)
  Problem: Education: Goal: Ability to demonstrate management of disease process will improve Outcome: Progressing Goal: Ability to verbalize understanding of medication therapies will improve Outcome: Progressing Goal: Individualized Educational Video(s) Outcome: Progressing   Problem: Activity: Goal: Capacity to carry out activities will improve Outcome: Progressing   Problem: Cardiac: Goal: Ability to achieve and maintain adequate cardiopulmonary perfusion will improve Outcome: Progressing   Problem: Education: Goal: Knowledge of General Education information will improve Description: Including pain rating scale, medication(s)/side effects and non-pharmacologic comfort measures Outcome: Progressing   Problem: Health Behavior/Discharge Planning: Goal: Ability to manage health-related needs will improve Outcome: Progressing   Problem: Clinical Measurements: Goal: Ability to maintain clinical measurements within normal limits will improve Outcome: Progressing Goal: Will remain free from infection Outcome: Progressing Goal: Diagnostic test results will improve Outcome: Progressing Goal: Respiratory complications will improve Outcome: Progressing Goal: Cardiovascular complication will be avoided Outcome: Progressing   Problem: Activity: Goal: Risk for activity intolerance will decrease Outcome: Progressing   Problem: Nutrition: Goal: Adequate nutrition will be maintained Outcome: Progressing   Problem: Coping: Goal: Level of anxiety will decrease Outcome: Progressing   Problem: Elimination: Goal: Will not experience complications related to bowel motility Outcome: Progressing Goal: Will not experience complications related to urinary retention Outcome: Progressing   Problem: Pain Managment: Goal: General experience of comfort will improve Outcome: Progressing   Problem: Safety: Goal: Ability to remain free from injury will improve Outcome: Progressing    Problem: Skin Integrity: Goal: Risk for impaired skin integrity will decrease Outcome: Progressing   Problem: Education: Goal: Knowledge of General Education information will improve Description: Including pain rating scale, medication(s)/side effects and non-pharmacologic comfort measures Outcome: Progressing   Problem: Health Behavior/Discharge Planning: Goal: Ability to manage health-related needs will improve Outcome: Progressing   Problem: Clinical Measurements: Goal: Ability to maintain clinical measurements within normal limits will improve Outcome: Progressing Goal: Will remain free from infection Outcome: Progressing Goal: Diagnostic test results will improve Outcome: Progressing Goal: Respiratory complications will improve Outcome: Progressing Goal: Cardiovascular complication will be avoided Outcome: Progressing   Problem: Activity: Goal: Risk for activity intolerance will decrease Outcome: Progressing   Problem: Nutrition: Goal: Adequate nutrition will be maintained Outcome: Progressing   Problem: Coping: Goal: Level of anxiety will decrease Outcome: Progressing   Problem: Elimination: Goal: Will not experience complications related to bowel motility Outcome: Progressing Goal: Will not experience complications related to urinary retention Outcome: Progressing   Problem: Pain Managment: Goal: General experience of comfort will improve Outcome: Progressing   Problem: Safety: Goal: Ability to remain free from injury will improve Outcome: Progressing   Problem: Skin Integrity: Goal: Risk for impaired skin integrity will decrease Outcome: Progressing   

## 2021-02-11 NOTE — Progress Notes (Signed)
95% on 3L while ambulating to hallway with walker and back to bed.

## 2021-02-12 DIAGNOSIS — Z9889 Other specified postprocedural states: Secondary | ICD-10-CM

## 2021-02-12 DIAGNOSIS — I509 Heart failure, unspecified: Secondary | ICD-10-CM

## 2021-02-12 DIAGNOSIS — Z6838 Body mass index (BMI) 38.0-38.9, adult: Secondary | ICD-10-CM

## 2021-02-12 LAB — CBC
HCT: 29.9 % — ABNORMAL LOW (ref 36.0–46.0)
Hemoglobin: 9.4 g/dL — ABNORMAL LOW (ref 12.0–15.0)
MCH: 26 pg (ref 26.0–34.0)
MCHC: 31.4 g/dL (ref 30.0–36.0)
MCV: 82.8 fL (ref 80.0–100.0)
Platelets: 237 10*3/uL (ref 150–400)
RBC: 3.61 MIL/uL — ABNORMAL LOW (ref 3.87–5.11)
RDW: 19.1 % — ABNORMAL HIGH (ref 11.5–15.5)
WBC: 12.2 10*3/uL — ABNORMAL HIGH (ref 4.0–10.5)
nRBC: 0 % (ref 0.0–0.2)

## 2021-02-12 LAB — BASIC METABOLIC PANEL
Anion gap: 9 (ref 5–15)
BUN: 40 mg/dL — ABNORMAL HIGH (ref 8–23)
CO2: 35 mmol/L — ABNORMAL HIGH (ref 22–32)
Calcium: 9.4 mg/dL (ref 8.9–10.3)
Chloride: 93 mmol/L — ABNORMAL LOW (ref 98–111)
Creatinine, Ser: 1.1 mg/dL — ABNORMAL HIGH (ref 0.44–1.00)
GFR, Estimated: 52 mL/min — ABNORMAL LOW (ref >60)
Glucose, Bld: 116 mg/dL — ABNORMAL HIGH (ref 70–99)
Potassium: 3.9 mmol/L (ref 3.5–5.1)
Sodium: 137 mmol/L (ref 135–145)

## 2021-02-12 LAB — GLUCOSE, CAPILLARY
Glucose-Capillary: 112 mg/dL — ABNORMAL HIGH (ref 70–99)
Glucose-Capillary: 199 mg/dL — ABNORMAL HIGH (ref 70–99)

## 2021-02-12 MED ORDER — TRAZODONE HCL 50 MG PO TABS: 50.0000 mg | ORAL_TABLET | Freq: Every evening | ORAL | 0 refills | Status: AC | PRN

## 2021-02-12 MED ORDER — PREDNISONE 20 MG PO TABS
40.0000 mg | ORAL_TABLET | Freq: Every day | ORAL | 0 refills | Status: AC
Start: 2021-02-13 — End: 2021-02-14

## 2021-02-12 MED ORDER — LANTUS SOLOSTAR 100 UNIT/ML ~~LOC~~ SOPN: 32.0000 [IU] | PEN_INJECTOR | Freq: Every day | SUBCUTANEOUS | 11 refills | Status: DC

## 2021-02-12 MED ORDER — IRON 325 (65 FE) MG PO TABS: 1.0000 | ORAL_TABLET | Freq: Every day | ORAL | 11 refills | Status: DC

## 2021-02-12 MED ORDER — PREDNISONE 20 MG PO TABS
40.0000 mg | ORAL_TABLET | Freq: Every day | ORAL | Status: DC
  Administered 2021-02-12: 40 mg via ORAL
  Filled 2021-02-12: qty 2

## 2021-02-12 MED ORDER — FUROSEMIDE 20 MG PO TABS: ORAL_TABLET | ORAL | 1 refills | Status: DC

## 2021-02-12 NOTE — Progress Notes (Signed)
Progress Note  Patient Name: Karen [name] Date of Encounter: 02/12/2021  [facility] Cardiologist: Ida Rogue, MD   Subjective   Kidney function stable. UOP -643mL. Plan for discharge today. No chest pain. She is on 3L O2.   Inpatient Medications    Scheduled Meds:  albuterol  2.5 mg Nebulization TID   allopurinol  100 mg Oral Daily   apixaban  5 mg Oral BID   atorvastatin  10 mg Oral Daily   ferrous sulfate  325 mg Oral Daily   fluticasone  2 spray Each Nare Daily   fluticasone furoate-vilanterol  1 puff Inhalation Daily   insulin aspart  0-20 Units Subcutaneous TID WC   insulin aspart  0-5 Units Subcutaneous QHS   insulin aspart  10 Units Subcutaneous TID WC   insulin glargine-yfgn  34 Units Subcutaneous Daily   montelukast  10 mg Oral QHS   pantoprazole  40 mg Oral Daily   predniSONE  40 mg Oral Q breakfast   traZODone  50 mg Oral QHS   umeclidinium bromide  1 puff Inhalation Daily   vitamin B-12  500 mcg Oral Daily   Continuous Infusions:  PRN Meds: acetaminophen, alum & mag hydroxide-simeth, diphenhydrAMINE, guaiFENesin-dextromethorphan, ipratropium-albuterol, ondansetron **OR** ondansetron (ZOFRAN) IV, oxyCODONE   Vital Signs    Vitals:   02/12/21 0140 02/12/21 0448 02/12/21 0742 02/12/21 0804  BP:  (!) 141/61  (!) 124/58  Pulse:  79  72  Resp:  20  18  Temp:  98.1 F (36.7 C)  98.9 F (37.2 C)  TempSrc:    Oral  SpO2:  97% 91% 95%  Weight: 99 kg     Height:        Intake/Output Summary (Last 24 hours) at 02/12/2021 0827 Last data filed at 02/12/2021 0400 Gross per 24 hour  Intake 260 ml  Output 600 ml  Net -340 ml   Last 3 Weights 02/12/2021 02/10/2021 02/09/2021  Weight (lbs) 218 lb 4.1 oz 216 lb 4.3 oz 214 lb  Weight (kg) 99 kg 98.1 kg 97.07 kg      Telemetry    NSR, HR 70s - Personally Reviewed  ECG    No new - Personally Reviewed  Physical Exam   GEN: No acute distress.   Neck: No JVD Cardiac: RRR, no murmurs, rubs,  or gallops.  Respiratory: Clear to auscultation bilaterally. GI: Soft, nontender, non-distended  MS: No edema; No deformity. Neuro:  Nonfocal  Psych: Normal affect   Labs    High Sensitivity Troponin:   Recent Labs  Lab 02/01/21 1256 02/01/21 1627  TROPONINIHS 11 10     Chemistry Recent Labs  Lab 02/06/21 1057 02/07/21 0448 02/08/21 0318 02/09/21 0415 02/10/21 0807 02/11/21 0757 02/12/21 0505  NA 136 137 135 135 133* 134* 137  K 3.8 3.9 3.7 3.7 3.7 3.7 3.9  CL 92* 91* 90* 89* 89* 90* 93*  CO2 38* 37* 39* 38* 37* 35* 35*  GLUCOSE 202* 189* 178* 195* 220* 239* 116*  BUN 14 15 17 20  26* 38* 40*  CREATININE 0.70 0.78 0.70 0.85 0.82 1.11* 1.10*  CALCIUM 8.9 8.8* 8.5* 9.0 8.9 9.2 9.4  MG 1.8 2.0 1.8 1.8  --   --   --   PROT 6.1*  --   --   --   --   --   --   ALBUMIN 3.0* 3.1*  --   --   --   --   --  AST 12*  --   --   --   --   --   --   ALT 9  --   --   --   --   --   --   ALKPHOS 75  --   --   --   --   --   --   BILITOT 0.8  --   --   --   --   --   --   GFRNONAA >60 >60 >60 >60 >60 52* 52*  ANIONGAP 6 9 6 8 7 9 9     Lipids No results for input(s): CHOL, TRIG, HDL, LABVLDL, LDLCALC, CHOLHDL in the last 168 hours.  Hematology Recent Labs  Lab 02/07/21 0448 02/08/21 0318 02/12/21 0505  WBC 10.7* 10.0 12.2*  RBC 3.66* 3.55* 3.61*  HGB 9.3* 8.8* 9.4*  HCT 30.2* 29.8* 29.9*  MCV 82.5 83.9 82.8  MCH 25.4* 24.8* 26.0  MCHC 30.8 29.5* 31.4  RDW 17.5* 17.8* 19.1*  PLT 273 255 237   Thyroid No results for input(s): TSH, FREET4 in the last 168 hours.  BNP Recent Labs  Lab 02/06/21 1057 02/07/21 0448  BNP 236.0* 231.6*    DDimer  Recent Labs  Lab 02/08/21 0928  DDIMER 0.77*     Radiology    No results found.  Cardiac Studies   2D echo 02/06/2021: 1. Left ventricular ejection fraction, by estimation, is 60 to 65%. The  left ventricle has normal function. The left ventricle has no regional  wall motion abnormalities. Left ventricular diastolic  parameters are  consistent with Grade I diastolic  dysfunction (impaired relaxation).   2. Right ventricular systolic function is mildly reduced. The right  ventricular size is mildly enlarged. There is severely elevated pulmonary  artery systolic pressure. The estimated right ventricular systolic  pressure is 65.7 mmHg.   3. The mitral valve is normal in structure. Mild to moderate mitral valve  regurgitation. No evidence of mitral stenosis.   4. Tricuspid valve regurgitation is moderate to severe.   5. The aortic valve is normal in structure. Aortic valve regurgitation is  not visualized. Mild aortic valve stenosis. Aortic valve mean gradient  measures 17.0 mmHg. Aortic valve Vmax measures 2.71 m/s.   6. The inferior vena cava is dilated in size with >50% respiratory  variability, suggesting right atrial pressure of 8 mmHg. __________   2D echo 06/2020: 1. Left ventricular ejection fraction, by estimation, is 60 to 65%. The  left ventricle has normal function. There is mild left ventricular  hypertrophy.   2. Right ventricular systolic function is normal. The right ventricular  size is normal.   3. Trivial mitral valve regurgitation.   4. AV is thickened, calcified Difficult to see well Peak and mean  gradients through the vlave are 30 and 15 mm Hg respectively AVA (VTI) is  1.03 cm2. Dimensionless index is 0.44. All consistent with mild to  moderate AS. Compared to echo report from September 28, 2019 no significant change . Aortic valve regurgitation is not  visualized.   5. The inferior vena cava is normal in size with greater than 50%  respiratory variability, suggesting right atrial pressure of 3 mmHg. __________   2D echo 09/2019: 1. Left ventricular ejection fraction, by estimation, is 55 to 60%. The  left ventricle has normal function. The left ventricle has no regional  wall motion abnormalities. There is mild left ventricular hypertrophy.  Left ventricular diastolic  parameters  are consistent with Grade I diastolic dysfunction (impaired relaxation).   2. Right ventricular systolic function is normal. The right ventricular  size is normal. There is severely elevated pulmonary artery systolic  pressure. The estimated right ventricular systolic pressure is 62.1 mmHg.   3. Left atrial size was mildly dilated.   4. The mitral valve is normal in structure. Trivial mitral valve  regurgitation. No evidence of mitral stenosis.   5. The aortic valve is abnormal. Aortic valve regurgitation is not  visualized. Moderate aortic valve stenosis. Aortic valve area, by VTI  measures 0.94 cm. Aortic valve mean gradient measures 17.3 mmHg. __________   Elwyn Reach patch 02/2019: Normal sinus rhythm with paroxysmal atrial fibrillation   avg HR of 89 bpm.  1 run of Ventricular Tachycardia occurred lasting 4 beats with a max rate of 179 bpm (avg 161 bpm).    Atrial Fibrillation occurred (<1% burden), ranging from 75-179 bpm (avg of 117 bpm), the longest lasting 32 mins 50 secs with an avg rate of 119 bpm.    Isolated SVEs were occasional (1.4%, 25270), SVE Couplets were rare (<1.0%, 2710), and SVE Triplets were rare (<1.0%, 299). Isolated VEs were rare (<1.0%, 617), VE Couplets were rare (<1.0%, 4), and VE Triplets were rare (<1.0%, 1). Ventricular Bigeminy was present.  __________   2D echo 02/2019: 1. Left ventricular ejection fraction, by visual estimation, is 65 to  70%. The left ventricle has normal function. Left ventricular septal wall  thickness was normal. Normal left ventricular posterior wall thickness.  There is no left ventricular  hypertrophy.   2. The left ventricle has no regional wall motion abnormalities.   3. Global right ventricle has normal systolic function.The right  ventricular size is normal. No increase in right ventricular wall  thickness.   4. Left atrial size was normal.   5. Right atrial size was normal.   6. The mitral valve is grossly  normal. Trivial mitral valve  regurgitation.   7. The tricuspid valve is grossly normal. Tricuspid valve regurgitation  is trivial.   8. The aortic valve was not well visualized. Aortic valve regurgitation  is not visualized.   9. The pulmonic valve was not well visualized. Pulmonic valve  regurgitation is trivial.  10. The aortic root was not well visualized.  11. Moderately elevated pulmonary artery systolic pressure.  12. The atrial septum is grossly normal. __________   2D echo 08/2018: 1. The left ventricle has low normal systolic function, with an ejection  fraction of 50-55%. The cavity size was normal. There is mild concentric  left ventricular hypertrophy. Left ventricular diastolic Doppler  parameters are indeterminate.   2. The right ventricle has low normal systolic function. The cavity was  mildly enlarged. There is no increase in right ventricular wall thickness.  Right ventricular systolic pressure is moderately elevated with an  estimated pressure of 60.4 mmHg.   3. Left atrial size was mildly dilated.   4. Right atrial size was mildly dilated.   5. The mitral valve is grossly normal.   6. The tricuspid valve is grossly normal. Tricuspid valve regurgitation  is mild-moderate.   7. The aortic valve is grossly normal. Mild thickening of the aortic  valve. Mild calcification of the aortic valve. Aortic valve regurgitation  was not assessed by color flow Doppler. Mild stenosis of the aortic valve.   8. The inferior vena cava was dilated in size with <50% respiratory  variability. __________   2D echo 06/2018:  1.  The left ventricle has low normal systolic function, with an ejection  fraction of 50-55%. The cavity size was normal. Left ventricular diastolic  parameters were normal.   2. The right ventricle has normal systolic function. The cavity was  normal. There is no increase in right ventricular wall thickness.   3. Mild thickening of the mitral valve leaflet.    4. The aortic valve is tricuspid. Moderate thickening of the aortic  valve. Mild calcification of the aortic valve. Aortic valve regurgitation  is trivial by color flow Doppler. Mild stenosis of the aortic valve.    Patient Profile     75 y.o. female with history of HFpEF, severe pulmonary hypertension, moderate aortic stenosis, junctional bradycardia with BB/CCB, PAF by 03/2019 monitor, chronic chest pain, GI bleed, Covid in 10/2020, DM2, HTN, HLD, chronic hypoxic respiratory failure on supplemental oxygen, COPD, anemia, and obesity who is being seen today for the evaluation of acute on chronic HFpEF/pulmonary hypertension.  Assessment & Plan    Acute on chronic hypoxic respiratory failure - in the setting of acute on chronic HFpEF, severe pulmonary HTN, right sided pleural effusion with underlying COPD and poor pulmonary reserve, AS, anemia, obesity and deconditioning - she is on 3L O2 at baseline - s/p thoracentesis on 12/5 with 600cc fluid removal with cytology pending - on  3L O2, wean O2 as tolerated   Acute on chronic HFpEF/severe pulmonary HTN/pleural effusion - volume status improved with IV lasix - I/Os net-10L - weight down 260>216lbs - s/p thoracentesis as above - IV lasix held for increase kidney function - kidney function stable - needs follow-up labs in a week.  - no BB with junctional bradycardia - continue PTA losartan - PTA lasix 40mg  daily, may need higher dose at home   PAF - maintaining NSR - CHADSVASC of 6 - continue Eliquis - no amio with pulmonary disease   Mitral/tricuspid regurgitation - continue with lasix as above   Aortic stenosis - mild to moderate by echo this admission - continue OP follow-up   Junctional bradycardia - previously seen while on BB and CCB - telemetry showed NSR with PACS/PVCs and no further junctional bradycardia   HTN - Bps good - PTA meds held   HLD - continue Lipitor 10mg  daily   COPD - per IM  For questions or  updates, please contact [facility] Please consult [email or web address] for contact info under        Signed, Gaylen Pereira Ninfa Meeker, PA-C  02/12/2021, 8:27 AM

## 2021-02-12 NOTE — Progress Notes (Signed)
Karen [name] to be D/C'd to [facility] per MD order. Discussed with the patient and all questions fully answered. Report called to [name], Therapist, sports. Skin clean and dry without evidence of skin break down, no evidence of skin tears noted.  IV catheter discontinued intact. Site without signs and symptoms of complications. Dressing and pressure applied.  An After Visit Summary was printed and given to the patient.  Patient escorted via stretcher, and D/C to [facility] via EMS  [name]  02/12/2021 1:50 PM

## 2021-02-12 NOTE — Discharge Summary (Signed)
[facility] NAME: Karen [name]    MR#:  [id number]  DATE OF BIRTH:  [birth year]  DATE OF ADMISSION:  02/01/2021 ADMITTING PHYSICIAN: Collier Bullock, MD  DATE OF DISCHARGE: 02/12/2021  PRIMARY CARE PHYSICIAN: D, Ples Specter, FNP    ADMISSION DIAGNOSIS:  Acute diastolic CHF (congestive heart failure) (HCC) [I50.31] Acute congestive heart failure, unspecified heart failure type ([place]) [I50.9]  DISCHARGE DIAGNOSIS:  Principal Problem:   Acute diastolic CHF (congestive heart failure) (HCC) Active Problems:   COPD (chronic obstructive pulmonary disease) (HCC)   Hypertension   Acute on chronic diastolic CHF (congestive heart failure) (HCC)   CKD (chronic kidney disease), stage IIIa   Class 2 severe obesity with serious comorbidity and body mass index (BMI) of 38.0 to 38.9 in adult [facility])   Aortic stenosis, moderate   AF (paroxysmal atrial fibrillation) (HCC)   Pleural effusion   Acute lower UTI   Acute metabolic encephalopathy   Acute kidney injury superimposed on CKD ([place])   SECONDARY DIAGNOSIS:   Past Medical History:  Diagnosis Date   (HFpEF) heart failure with preserved ejection fraction ([place])    a. 2017 Echo: EF 50%; b. 06/2018 Echo: EF 50-55%; c. 08/2018 Echo: EF 50-55%, Nl RV fxn; d. 09/2019 Echo: EF 55-60%, no rwma, mild LVH, Gr1 DD, nl RV size/fxn, PASP 63.21mmHg. Mildly dil LA. Triv MR. Mod AS (AoV 0.94cm^2 VTI; mean grad 17.31mmHg).   Acute on chronic respiratory failure with hypoxia and hypercapnia (HCC) 01/07/2015   Anemia    Asterixis 01/07/2015   Asthma    Cataract    CKD (chronic kidney disease), stage III (HCC)    COPD (chronic obstructive pulmonary disease) ([place])    a. 06/2018 tobacco use, home 3L oxygen    Diabetes mellitus without complication ([place])    a. 09/2019 A1C 8.8   Edema, peripheral 04/20/2014   GI bleed 06/28/2019   History of kidney stones    Hyperlipidemia    Hypertension    Iron deficiency anemia  06/22/2014   Junctional bradycardia    a. In setting of beta blocker therapy.   Leucocytosis 10/19/2015   Moderate aortic stenosis    a.  09/2019 Echo: Mod AS (AoV 0.94cm^2 VTI; mean grad 17.40mmHg).   Morbid obesity ([place])    Overactive bladder    Primary osteoarthritis of right knee 09/01/2016   Sciatica 01/07/2015    HOSPITAL COURSE:   Acute on chronic hypoxic respiratory failure with COPD exacerbation and CHF exacerbation.  Yesterday afternoon was able to hold her saturations on her chronic 3 L of oxygen.  During the hospital course did have a pulse ox of 87% and she did also require higher amount of oxygen with high flow nasal cannula and then was converted over to 6 L nasal cannula and came taper down to her chronic 3 L. COPD exacerbation.  Patient was given IV Solu-Medrol.  Once we started Solu-Medrol her lungs started sounding better with her moving better air.  Her respiratory status improved and oxygen requirements came down to her baseline 3 L of oxygen.  Continue steroids today and on 02/13/2021 then discontinue. Acute on chronic diastolic congestive heart failure and pulmonary hypertension.  Patient also has moderate to severe tricuspid regurgitation mild to moderate mitral regurgitation.  The patient was diuresed aggressively during the hospital stay.  Diuresis held yesterday with creatinine going up to 1.11.  Today's creatinine 1.10 I will hold diuresis this morning and restart this  afternoon.  The patient will go home on 40 mg of Lasix in the morning and 20 mg of Lasix in the afternoon.  Recommend daily weights and if gains 2 pounds in a day can increase to 40 mg in the afternoon. Acute kidney injury on chronic kidney disease stage IIIa.  Creatinine peaked at 1.28 and was as good as 0.82.  Creatinine upon discharge 1.10.  Recommend checking BMP in 3 days. Type 2 diabetes mellitus with hyperlipidemia.  The patient sugars have been on the higher side with the steroids.  Continue Lantus  insulin 32 units daily and short acting insulin prior to meals.  Continue checking fingersticks before every meal and nightly.  Continue atorvastatin. Right pleural effusion status postthoracentesis on 02/07/2021 with 600 mL removed. Acute metabolic encephalopathy this has resolved Paroxysmal atrial fibrillation on Eliquis for anticoagulation UTI with E. coli and Klebsiella treated fully while here in the hospital Weakness.  Physical therapy recommends rehab Obesity with a BMI of 38.66 with a weight of 99 kg.  DISCHARGE CONDITIONS:   Satisfactory  CONSULTS OBTAINED:  Treatment Team:  Wellington Hampshire, MD  DRUG ALLERGIES:   Allergies  Allergen Reactions   Ace Inhibitors Hives   Beta Adrenergic Blockers     Junctional bradycardia   Gabapentin Hives   Lisinopril Hives   Lyrica [Pregabalin] Hives   Shrimp [Shellfish Allergy] Swelling    Swelling of the lips    DISCHARGE MEDICATIONS:   Allergies as of 02/12/2021       Reactions   Ace Inhibitors Hives   Beta Adrenergic Blockers    Junctional bradycardia   Gabapentin Hives   Lisinopril Hives   Lyrica [pregabalin] Hives   Shrimp [shellfish Allergy] Swelling   Swelling of the lips        Medication List     STOP taking these medications    benzonatate 100 MG capsule Commonly known as: TESSALON   Dulaglutide 3 MG/0.5ML Sopn   guaiFENesin-dextromethorphan 100-10 MG/5ML syrup Commonly known as: ROBITUSSIN DM   hydrochlorothiazide 25 MG tablet Commonly known as: HYDRODIURIL   metFORMIN 500 MG tablet Commonly known as: GLUCOPHAGE   oxyCODONE 5 MG immediate release tablet Commonly known as: Oxy IR/ROXICODONE   promethazine-dextromethorphan 6.25-15 MG/5ML syrup Commonly known as: PROMETHAZINE-DM       TAKE these medications    acetaminophen 500 MG tablet Commonly known as: TYLENOL Take 1-2 tablets (500-1,000 mg total) by mouth every 6 (six) hours as needed for mild pain, fever, moderate pain or headache.  Do not take more than 4 grams a day What changed: reasons to take this   albuterol 108 (90 Base) MCG/ACT inhaler Commonly known as: VENTOLIN HFA Inhale 2 puffs into the lungs every 6 (six) hours as needed for wheezing or shortness of breath.   allopurinol 100 MG tablet Commonly known as: ZYLOPRIM Take 1 tablet by mouth daily.   atorvastatin 10 MG tablet Commonly known as: LIPITOR Take 1 tablet by mouth daily.   Eliquis 5 MG Tabs tablet Generic drug: apixaban Take 1 tablet by mouth twice daily   esomeprazole 40 MG capsule Commonly known as: NEXIUM Take 40 mg by mouth daily.   fluticasone 50 MCG/ACT nasal spray Commonly known as: FLONASE Place 2 sprays into both nostrils daily.   Fluticasone-Umeclidin-Vilant 100-62.5-25 MCG/INH Aepb Inhale 1 puff into the lungs daily.   furosemide 20 MG tablet Commonly known as: Lasix 2 tabs po in morning and 1 tab po in afternoon (may take extra tab  in afternoon if gains 2 lbs in a day) What changed:  medication strength how much to take how to take this when to take this additional instructions   insulin lispro 100 UNIT/ML KwikPen Commonly known as: HUMALOG Inject 10 Units into the skin in the morning, at noon, and at bedtime.   ipratropium 0.06 % nasal spray Commonly known as: ATROVENT Place 2 sprays into both nostrils 4 (four) times daily.   ipratropium-albuterol 0.5-2.5 (3) MG/3ML Soln Commonly known as: DUONEB Take 3 mLs by nebulization 4 (four) times daily as needed.   Iron 325 (65 Fe) MG Tabs Take 1 tablet (325 mg total) by mouth daily.   Lantus SoloStar 100 UNIT/ML Solostar Pen Generic drug: insulin glargine Inject 32 Units into the skin daily. What changed: how much to take   losartan 50 MG tablet Commonly known as: COZAAR Take 50 mg by mouth daily.   melatonin 5 MG Tabs Take 5 mg by mouth at bedtime.   montelukast 10 MG tablet Commonly known as: SINGULAIR Take 10 mg by mouth at bedtime.   predniSONE 20  MG tablet Commonly known as: DELTASONE Take 2 tablets (40 mg total) by mouth daily with breakfast for 1 day. Start taking on: February 13, 2021   senna-docusate 8.6-50 MG tablet Commonly known as: Senokot-S Take 1 tablet by mouth 2 (two) times daily between meals as needed for mild constipation.   traZODone 50 MG tablet Commonly known as: DESYREL Take 1 tablet (50 mg total) by mouth at bedtime as needed for sleep.   vitamin B-12 500 MCG tablet Commonly known as: CYANOCOBALAMIN Take 500 mcg by mouth daily.   VITAMIN D (CHOLECALCIFEROL) PO Take 1 tablet by mouth daily.         DISCHARGE INSTRUCTIONS:   Follow-up with team at rehab 1 day Follow-up cardiology 2 weeks Follow-up CHF clinic  If you experience worsening of your admission symptoms, develop shortness of breath, life threatening emergency, suicidal or homicidal thoughts you must seek medical attention immediately by calling 911 or calling your MD immediately  if symptoms less severe.  You Must read complete instructions/literature along with all the possible adverse reactions/side effects for all the Medicines you take and that have been prescribed to you. Take any new Medicines after you have completely understood and accept all the possible adverse reactions/side effects.   Please note  You were cared for by a hospitalist during your hospital stay. If you have any questions about your discharge medications or the care you received while you were in the hospital after you are discharged, you can call the unit and asked to speak with the hospitalist on call if the hospitalist that took care of you is not available. Once you are discharged, your primary care physician will handle any further medical issues. Please note that NO REFILLS for any discharge medications will be authorized once you are discharged, as it is imperative that you return to your primary care physician (or establish a relationship with a primary care  physician if you do not have one) for your aftercare needs so that they can reassess your need for medications and monitor your lab values.    Today   CHIEF COMPLAINT:   Chief Complaint  Patient presents with   Shortness of Breath    HISTORY OF PRESENT ILLNESS:  Karen [name]  is a 75 y.o. female came in with shortness of breath   VITAL SIGNS:  Blood pressure (!) 124/58, pulse 72, temperature 98.9  F (37.2 C), temperature source Oral, resp. rate 18, height 5\' 3"  (1.6 m), weight 99 kg, SpO2 95 %.  I/O:   Intake/Output Summary (Last 24 hours) at 02/12/2021 0825 Last data filed at 02/12/2021 0400 Gross per 24 hour  Intake 260 ml  Output 600 ml  Net -340 ml    PHYSICAL EXAMINATION:  GENERAL:  75 y.o.-year-old patient lying in the bed with no acute distress.  EYES: Pupils equal, round, reactive to light and accommodation. No scleral icterus. HEENT: Head atraumatic, normocephalic. Oropharynx and nasopharynx clear.  LUNGS: decreased breath sounds bilateral bases, no wheezing, rales,rhonchi or crepitation. No use of accessory muscles of respiration.  CARDIOVASCULAR: S1, S2 normal. 2/6 systolic murmur. ABDOMEN: Soft, non-tender, non-distended. Bowel sounds present. No organomegaly or mass.  EXTREMITIES: Trace pedal edema, No cyanosis, or clubbing.  NEUROLOGIC: Cranial nerves II through XII are intact. Muscle strength 5/5 in all extremities. Sensation intact. Gait not checked.  PSYCHIATRIC: The patient is alert and oriented x 3.  SKIN: No obvious rash, lesion, or ulcer.   DATA REVIEW:   CBC Recent Labs  Lab 02/12/21 0505  WBC 12.2*  HGB 9.4*  HCT 29.9*  PLT 237    Chemistries  Recent Labs  Lab 02/06/21 1057 02/07/21 0448 02/09/21 0415 02/10/21 0807 02/12/21 0505  NA 136   < > 135   < > 137  K 3.8   < > 3.7   < > 3.9  CL 92*   < > 89*   < > 93*  CO2 38*   < > 38*   < > 35*  GLUCOSE 202*   < > 195*   < > 116*  BUN 14   < > 20   < > 40*  CREATININE 0.70   < >  0.85   < > 1.10*  CALCIUM 8.9   < > 9.0   < > 9.4  MG 1.8   < > 1.8  --   --   AST 12*  --   --   --   --   ALT 9  --   --   --   --   ALKPHOS 75  --   --   --   --   BILITOT 0.8  --   --   --   --    < > = values in this interval not displayed.     Microbiology Results  Results for orders placed or performed during the hospital encounter of 02/01/21  Resp Panel by RT-PCR (Flu A&B, Covid) Nasopharyngeal Swab     Status: None   Collection Time: 02/01/21 12:56 PM   Specimen: Nasopharyngeal Swab; Nasopharyngeal(NP) swabs in vial transport medium  Result Value Ref Range Status   SARS Coronavirus 2 by RT PCR NEGATIVE NEGATIVE Final    Comment: (NOTE) SARS-CoV-2 target nucleic acids are NOT DETECTED.  The SARS-CoV-2 RNA is generally detectable in upper respiratory specimens during the acute phase of infection. The lowest concentration of SARS-CoV-2 viral copies this assay can detect is 138 copies/mL. A negative result does not preclude SARS-Cov-2 infection and should not be used as the sole basis for treatment or other patient management decisions. A negative result may occur with  improper specimen collection/handling, submission of specimen other than nasopharyngeal swab, presence of viral mutation(s) within the areas targeted by this assay, and inadequate number of viral copies(<138 copies/mL). A negative result must be combined with clinical observations, patient history, and epidemiological information. The  expected result is Negative.  Fact Sheet for Patients:  EntrepreneurPulse.com.au  Fact Sheet for Healthcare Providers:  IncredibleEmployment.be  This test is no t yet approved or cleared by the [place] FDA and  has been authorized for detection and/or diagnosis of SARS-CoV-2 by FDA under an Emergency Use Authorization (EUA). This EUA will remain  in effect (meaning this test can be used) for the duration of the COVID-19  declaration under Section 564(b)(1) of the Act, 21 U.S.C.section 360bbb-3(b)(1), unless the authorization is terminated  or revoked sooner.       Influenza A by PCR NEGATIVE NEGATIVE Final   Influenza B by PCR NEGATIVE NEGATIVE Final    Comment: (NOTE) The Xpert Xpress SARS-CoV-2/FLU/RSV plus assay is intended as an aid in the diagnosis of influenza from Nasopharyngeal swab specimens and should not be used as a sole basis for treatment. Nasal washings and aspirates are unacceptable for Xpert Xpress SARS-CoV-2/FLU/RSV testing.  Fact Sheet for Patients: EntrepreneurPulse.com.au  Fact Sheet for Healthcare Providers: IncredibleEmployment.be  This test is not yet approved or cleared by the [place] FDA and has been authorized for detection and/or diagnosis of SARS-CoV-2 by FDA under an Emergency Use Authorization (EUA). This EUA will remain in effect (meaning this test can be used) for the duration of the COVID-19 declaration under Section 564(b)(1) of the Act, 21 U.S.C. section 360bbb-3(b)(1), unless the authorization is terminated or revoked.  Performed at [facility], [address] [place], [place] [address]   Urine Culture     Status: Abnormal   Collection Time: 02/01/21  1:42 PM   Specimen: Urine, Catheterized  Result Value Ref Range Status   Specimen Description   Final    URINE, CATHETERIZED Performed at [facility], [address] [place], [place] [address]    Special Requests   Final    NONE Performed at [facility], [address] [place], [place] [address]    Culture (A)  Final    >=100,000 COLONIES/mL ESCHERICHIA COLI 80,000 COLONIES/mL KLEBSIELLA PNEUMONIAE    Report Status 02/03/2021 FINAL  Final   Organism ID, Bacteria ESCHERICHIA COLI (A)  Final   Organism ID, Bacteria KLEBSIELLA PNEUMONIAE (A)  Final      Susceptibility   Escherichia coli - MIC*    AMPICILLIN 8 SENSITIVE  Sensitive     CEFAZOLIN <=4 SENSITIVE Sensitive     CEFEPIME <=0.12 SENSITIVE Sensitive     CEFTRIAXONE <=0.25 SENSITIVE Sensitive     CIPROFLOXACIN <=0.25 SENSITIVE Sensitive     GENTAMICIN <=1 SENSITIVE Sensitive     IMIPENEM <=0.25 SENSITIVE Sensitive     NITROFURANTOIN <=16 SENSITIVE Sensitive     TRIMETH/SULFA <=20 SENSITIVE Sensitive     AMPICILLIN/SULBACTAM 4 SENSITIVE Sensitive     PIP/TAZO <=4 SENSITIVE Sensitive     * >=100,000 COLONIES/mL ESCHERICHIA COLI   Klebsiella pneumoniae - MIC*    AMPICILLIN >=32 RESISTANT Resistant     CEFAZOLIN <=4 SENSITIVE Sensitive     CEFEPIME <=0.12 SENSITIVE Sensitive     CEFTRIAXONE <=0.25 SENSITIVE Sensitive     CIPROFLOXACIN <=0.25 SENSITIVE Sensitive     GENTAMICIN <=1 SENSITIVE Sensitive     IMIPENEM <=0.25 SENSITIVE Sensitive     NITROFURANTOIN 64 INTERMEDIATE Intermediate     TRIMETH/SULFA <=20 SENSITIVE Sensitive     AMPICILLIN/SULBACTAM 4 SENSITIVE Sensitive     PIP/TAZO <=4 SENSITIVE Sensitive     * 80,000 COLONIES/mL KLEBSIELLA PNEUMONIAE  Resp Panel by RT-PCR (Flu A&B, Covid) Nasopharyngeal Swab  Status: None   Collection Time: 02/04/21  5:09 PM   Specimen: Nasopharyngeal Swab; Nasopharyngeal(NP) swabs in vial transport medium  Result Value Ref Range Status   SARS Coronavirus 2 by RT PCR NEGATIVE NEGATIVE Final    Comment: (NOTE) SARS-CoV-2 target nucleic acids are NOT DETECTED.  The SARS-CoV-2 RNA is generally detectable in upper respiratory specimens during the acute phase of infection. The lowest concentration of SARS-CoV-2 viral copies this assay can detect is 138 copies/mL. A negative result does not preclude SARS-Cov-2 infection and should not be used as the sole basis for treatment or other patient management decisions. A negative result may occur with  improper specimen collection/handling, submission of specimen other than nasopharyngeal swab, presence of viral mutation(s) within the areas targeted by this  assay, and inadequate number of viral copies(<138 copies/mL). A negative result must be combined with clinical observations, patient history, and epidemiological information. The expected result is Negative.  Fact Sheet for Patients:  EntrepreneurPulse.com.au  Fact Sheet for Healthcare Providers:  IncredibleEmployment.be  This test is no t yet approved or cleared by the [place] FDA and  has been authorized for detection and/or diagnosis of SARS-CoV-2 by FDA under an Emergency Use Authorization (EUA). This EUA will remain  in effect (meaning this test can be used) for the duration of the COVID-19 declaration under Section 564(b)(1) of the Act, 21 U.S.C.section 360bbb-3(b)(1), unless the authorization is terminated  or revoked sooner.       Influenza A by PCR NEGATIVE NEGATIVE Final   Influenza B by PCR NEGATIVE NEGATIVE Final    Comment: (NOTE) The Xpert Xpress SARS-CoV-2/FLU/RSV plus assay is intended as an aid in the diagnosis of influenza from Nasopharyngeal swab specimens and should not be used as a sole basis for treatment. Nasal washings and aspirates are unacceptable for Xpert Xpress SARS-CoV-2/FLU/RSV testing.  Fact Sheet for Patients: EntrepreneurPulse.com.au  Fact Sheet for Healthcare Providers: IncredibleEmployment.be  This test is not yet approved or cleared by the [place] FDA and has been authorized for detection and/or diagnosis of SARS-CoV-2 by FDA under an Emergency Use Authorization (EUA). This EUA will remain in effect (meaning this test can be used) for the duration of the COVID-19 declaration under Section 564(b)(1) of the Act, 21 U.S.C. section 360bbb-3(b)(1), unless the authorization is terminated or revoked.  Performed at [facility], [place], [place] [address]   Body fluid culture w Gram Stain     Status: None   Collection Time: 02/07/21  10:09 AM   Specimen: PATH Cytology Pleural fluid  Result Value Ref Range Status   Specimen Description PLEURAL  Final   Special Requests NONE  Final   Gram Stain   Final    FEW WBC PRESENT, PREDOMINANTLY MONONUCLEAR NO ORGANISMS SEEN    Culture   Final    NO GROWTH 3 DAYS Performed at [facility], [place] [address], [place], [place] [address]    Report Status 02/11/2021 FINAL  Final  Resp Panel by RT-PCR (Flu A&B, Covid) Nasopharyngeal Swab     Status: None   Collection Time: 02/11/21  2:15 PM   Specimen: Nasopharyngeal Swab; Nasopharyngeal(NP) swabs in vial transport medium  Result Value Ref Range Status   SARS Coronavirus 2 by RT PCR NEGATIVE NEGATIVE Final    Comment: (NOTE) SARS-CoV-2 target nucleic acids are NOT DETECTED.  The SARS-CoV-2 RNA is generally detectable in upper respiratory specimens during the acute phase of infection. The lowest concentration of SARS-CoV-2 viral  copies this assay can detect is 138 copies/mL. A negative result does not preclude SARS-Cov-2 infection and should not be used as the sole basis for treatment or other patient management decisions. A negative result may occur with  improper specimen collection/handling, submission of specimen other than nasopharyngeal swab, presence of viral mutation(s) within the areas targeted by this assay, and inadequate number of viral copies(<138 copies/mL). A negative result must be combined with clinical observations, patient history, and epidemiological information. The expected result is Negative.  Fact Sheet for Patients:  EntrepreneurPulse.com.au  Fact Sheet for Healthcare Providers:  IncredibleEmployment.be  This test is no t yet approved or cleared by the [place] FDA and  has been authorized for detection and/or diagnosis of SARS-CoV-2 by FDA under an Emergency Use Authorization (EUA). This EUA will remain  in effect (meaning this test can be used) for  the duration of the COVID-19 declaration under Section 564(b)(1) of the Act, 21 U.S.C.section 360bbb-3(b)(1), unless the authorization is terminated  or revoked sooner.       Influenza A by PCR NEGATIVE NEGATIVE Final   Influenza B by PCR NEGATIVE NEGATIVE Final    Comment: (NOTE) The Xpert Xpress SARS-CoV-2/FLU/RSV plus assay is intended as an aid in the diagnosis of influenza from Nasopharyngeal swab specimens and should not be used as a sole basis for treatment. Nasal washings and aspirates are unacceptable for Xpert Xpress SARS-CoV-2/FLU/RSV testing.  Fact Sheet for Patients: EntrepreneurPulse.com.au  Fact Sheet for Healthcare Providers: IncredibleEmployment.be  This test is not yet approved or cleared by the [place] FDA and has been authorized for detection and/or diagnosis of SARS-CoV-2 by FDA under an Emergency Use Authorization (EUA). This EUA will remain in effect (meaning this test can be used) for the duration of the COVID-19 declaration under Section 564(b)(1) of the Act, 21 U.S.C. section 360bbb-3(b)(1), unless the authorization is terminated or revoked.  Performed at [facility], [address], [place], [place] [address]       Management plans discussed with the patient, family and they are in agreement.  CODE STATUS:     Code Status Orders  (From admission, onward)           Start     Ordered   02/03/21 1806  Do not attempt resuscitation (DNR)  Continuous       Question Answer Comment  In the event of cardiac or respiratory ARREST Do not call a "code blue"   In the event of cardiac or respiratory ARREST Do not perform Intubation, CPR, defibrillation or ACLS   In the event of cardiac or respiratory ARREST Use medication by any route, position, wound care, and other measures to relive pain and suffering. May use oxygen, suction and manual treatment of airway obstruction as needed for comfort.       02/03/21 1805           Code Status History     Date Active Date Inactive Code Status Order ID Comments User Context   02/01/2021 1653 02/03/2021 1805 Full Code [id number]  Collier Bullock, MD ED   10/29/2020 1650 11/09/2020 1747 Full Code [id number]  Cox, [place], DO ED   06/17/2020 2215 06/21/2020 2140 Full Code [id number]  Mansy, Arvella Merles, MD ED   05/21/2020 2251 05/25/2020 1950 Full Code [id number]  Athena Masse, MD Inpatient   09/28/2019 0120 10/02/2019 2116 DNR [id number]  Sueanne Margarita, DO ED   06/28/2019 1333 06/30/2019 1619 DNR [id number]  Ivor Costa, MD Inpatient  06/28/2019 1255 06/28/2019 1333 DNR [id number]  Ivor Costa, MD Inpatient   02/23/2019 1938 02/27/2019 1810 Full Code [id number]  Edwin Dada, MD Inpatient   02/13/2019 2011 02/16/2019 1819 DNR [id number]  Nicolette Bang, DO ED   09/14/2018 2220 09/17/2018 2350 DNR [id number]  Henreitta Leber, MD Inpatient   08/05/2018 1547 08/10/2018 1949 Full Code [id number]  Gladstone Lighter, MD Inpatient   06/25/2018 1417 06/30/2018 1557 Full Code [id number]  Epifanio Lesches, MD ED   09/15/2017 1747 09/18/2017 2106 DNR [id number]  Hillary Bow, MD ED   08/26/2017 2216 08/30/2017 1604 Full Code [id number]  Nicholes Mango, MD Inpatient   01/08/2015 0307 01/09/2015 1336 Full Code [id number]  Theodoro Grist, MD Inpatient       TOTAL TIME TAKING CARE OF THIS PATIENT: 34 minutes.    Loletha Grayer M.D on 02/12/2021 at 8:25 AM   [facility]  CC: Primary care physician; D, Ples Specter, FNP

## 2021-02-12 NOTE — Plan of Care (Signed)
  Problem: Education: Goal: Ability to demonstrate management of disease process will improve Outcome: Progressing Goal: Ability to verbalize understanding of medication therapies will improve Outcome: Progressing Goal: Individualized Educational Video(s) Outcome: Progressing   Problem: Activity: Goal: Capacity to carry out activities will improve Outcome: Progressing   Problem: Cardiac: Goal: Ability to achieve and maintain adequate cardiopulmonary perfusion will improve Outcome: Progressing   Problem: Education: Goal: Knowledge of General Education information will improve Description: Including pain rating scale, medication(s)/side effects and non-pharmacologic comfort measures Outcome: Progressing   Problem: Health Behavior/Discharge Planning: Goal: Ability to manage health-related needs will improve Outcome: Progressing   Problem: Clinical Measurements: Goal: Ability to maintain clinical measurements within normal limits will improve Outcome: Progressing Goal: Will remain free from infection Outcome: Progressing Goal: Diagnostic test results will improve Outcome: Progressing Goal: Respiratory complications will improve Outcome: Progressing Goal: Cardiovascular complication will be avoided Outcome: Progressing   Problem: Activity: Goal: Risk for activity intolerance will decrease Outcome: Progressing   Problem: Nutrition: Goal: Adequate nutrition will be maintained Outcome: Progressing   Problem: Coping: Goal: Level of anxiety will decrease Outcome: Progressing   Problem: Elimination: Goal: Will not experience complications related to bowel motility Outcome: Progressing Goal: Will not experience complications related to urinary retention Outcome: Progressing   Problem: Pain Managment: Goal: General experience of comfort will improve Outcome: Progressing   Problem: Safety: Goal: Ability to remain free from injury will improve Outcome: Progressing    Problem: Skin Integrity: Goal: Risk for impaired skin integrity will decrease Outcome: Progressing   Problem: Education: Goal: Knowledge of General Education information will improve Description: Including pain rating scale, medication(s)/side effects and non-pharmacologic comfort measures Outcome: Progressing   Problem: Health Behavior/Discharge Planning: Goal: Ability to manage health-related needs will improve Outcome: Progressing   Problem: Clinical Measurements: Goal: Ability to maintain clinical measurements within normal limits will improve Outcome: Progressing Goal: Will remain free from infection Outcome: Progressing Goal: Diagnostic test results will improve Outcome: Progressing Goal: Respiratory complications will improve Outcome: Progressing Goal: Cardiovascular complication will be avoided Outcome: Progressing   Problem: Activity: Goal: Risk for activity intolerance will decrease Outcome: Progressing   Problem: Nutrition: Goal: Adequate nutrition will be maintained Outcome: Progressing   Problem: Coping: Goal: Level of anxiety will decrease Outcome: Progressing   Problem: Elimination: Goal: Will not experience complications related to bowel motility Outcome: Progressing Goal: Will not experience complications related to urinary retention Outcome: Progressing   Problem: Pain Managment: Goal: General experience of comfort will improve Outcome: Progressing   Problem: Safety: Goal: Ability to remain free from injury will improve Outcome: Progressing   Problem: Skin Integrity: Goal: Risk for impaired skin integrity will decrease Outcome: Progressing   

## 2021-02-12 NOTE — TOC Transition Note (Signed)
Transition of Care [facility]) - CM/SW Discharge Note   Patient Details  Name: Karen [name] MRN: [id number] Date of Birth: 1945-12-06  Transition of Care [facility]) CM/SW Contact:  Izola Price, RN Phone Number: 02/12/2021, 11:39 AM   Clinical Narrative:  DC Summary was successfully faxed to change nurse and confirmed via phone call. [facility] notified and pending transport to [facility] with oxygen at 3L/[place] continuous. Spouse, Karen [name] notified via phone call and he stated patient had notified him as well. Unit Rn updated. CN indicated they had everything they needed to receive patient. Simmie Davies RN CM      Final next level of care: Skilled Nursing Facility Barriers to Discharge: Barriers Resolved   Patient Goals and CMS Choice Patient states their goals for this hospitalization and ongoing recovery are:: to go home CMS Medicare.gov Compare Post Acute Care list provided to:: Patient Represenative (must comment) (husband) Choice offered to / list presented to : Spouse  Discharge Placement                Patient to be transferred to facility by: [facility] Name of family member notified: Aquilla Solian, spouse. Patient and family notified of of transfer: 02/12/21  Discharge Plan and Services     Post Acute Care Choice: [place]          DME Arranged: N/A DME Agency: NA       HH Arranged: NA HH Agency: NA        Social Determinants of Health (SDOH) Interventions     Readmission Risk Interventions Readmission Risk Prevention Plan 10/30/2020 06/21/2020 09/30/2019  Transportation Screening Complete Complete Complete  PCP or Specialist Appt within 5-7 Days - - -  PCP or Specialist Appt within 3-5 Days - - Complete  Home Care Screening - - -  Medication Review (RN CM) - - -  Social Work Scientific laboratory technician for [place] Planning/Counseling - - Complete  Palliative Care Screening - - Not Applicable  Medication Review Press photographer) Complete Complete Complete  PCP  or Specialist appointment within 3-5 days of discharge Complete Complete -  PCP/Specialist Appt Not Complete comments - - -  HRI or Home Care Consult Patient refused Complete -  SW Recovery Care/Counseling Consult Complete Complete -  Palliative Care Screening Not Applicable Not Applicable -  [place] Not Applicable Not Applicable -  Some recent data might be hidden

## 2021-02-12 NOTE — TOC Progression Note (Signed)
Transition of Care [facility]) - Progression Note    Patient Details  Name: Karen [name] MRN: [id number] Date of Birth: Sep 28, 1945  Transition of Care [facility]) CM/SW Contact  Izola Price, RN Phone Number: 02/12/2021, 9:45 AM  Clinical Narrative:   Jake Samples ID [id number]  [facility] not in today. Called back to reach nurses stations and spoke with [name], who indicated they could take patient today if medically ready. Given another Fax number for [name], [phone]. Fax is attempting but saying line is busy. Will continue to try to fax to accepting Unit nurse. Phone# [phone]. Will update provider and Unit RN. Inboxed DC Summary as well. Simmie Davies RN CM     Expected Discharge Plan: Skilled Nursing Facility Barriers to Discharge: Continued Medical Work up  Expected Discharge Plan and Services Expected Discharge Plan: [place] Choice: [place] arrangements for the past 2 months: Single Family Home Expected Discharge Date: 02/12/21                                     Social Determinants of Health (SDOH) Interventions    Readmission Risk Interventions Readmission Risk Prevention Plan 10/30/2020 06/21/2020 09/30/2019  Transportation Screening Complete Complete Complete  PCP or Specialist Appt within 5-7 Days - - -  PCP or Specialist Appt within 3-5 Days - - Complete  Home Care Screening - - -  Medication Review (RN CM) - - -  Social Work Scientific laboratory technician for [place] Planning/Counseling - - Complete  Palliative Care Screening - - Not Applicable  Medication Review Press photographer) Complete Complete Complete  PCP or Specialist appointment within 3-5 days of discharge Complete Complete -  PCP/Specialist Appt Not Complete comments - - -  HRI or Home Care Consult Patient refused Complete -  SW Recovery Care/Counseling Consult Complete Complete -  Palliative Care Screening Not Applicable Not Applicable -   [place] Not Applicable Not Applicable -  Some recent data might be hidden

## 2021-02-17 ENCOUNTER — Ambulatory Visit: Payer: Medicare Other | Admitting: Family

## 2021-03-02 ENCOUNTER — Other Ambulatory Visit (HOSPITAL_COMMUNITY): Payer: Self-pay

## 2021-03-02 NOTE — Progress Notes (Signed)
Spoke with [place] today.  She has come home from [employer].  She states has been home since last week.  She was glad to be home for Christmas.  She appears to be in a good mood.  She states has everything she needs.  She is aware of what meds to take and how to take them.  She states getting around ok, she states does not have PT coming out.  Still working on getting Palliative Care to see her.  She states watching high sodium foods and watching her fluid intake.  She is aware to get help if not urinating.  Will make a home visit next week.  She is aware to call for help if she needs any.  Will continue to visit for heart failure, diet and medication management.   [place] [phone]

## 2021-03-10 ENCOUNTER — Encounter (HOSPITAL_COMMUNITY): Payer: Self-pay

## 2021-03-10 ENCOUNTER — Other Ambulatory Visit (HOSPITAL_COMMUNITY): Payer: Self-pay

## 2021-03-10 ENCOUNTER — Telehealth: Payer: Self-pay | Admitting: Family

## 2021-03-10 ENCOUNTER — Ambulatory Visit: Payer: Medicare Other | Admitting: Family

## 2021-03-10 NOTE — Telephone Encounter (Signed)
Patient did not show for her Heart Failure Clinic appointment on 03/10/21. Will attempt to reschedule.

## 2021-03-10 NOTE — Progress Notes (Signed)
Today had a home visit with Karen [name].  She states doing well at home.  She came home 2 days before Christmas from rehab.  She states does walking inside the home.  She complains of knee hurting, appears swollen.  She does gets shots in it, close to get another one.  She denies chest pain, headaches, dizziness or increased shortness of breath.  She wears oxygen 24/7.  She tries to watch high sodium foods but on a budget and hard to do.  Her husband does most of the cooking and shopping.  She stays at home except for doctors appts.  She was not aware of HF clinic appt today, rescheduled it for her.  She is interested in Palliative Care, will check to see if [name] will do a referral, attempted with her PCP but they have not done one yet, been a few months.  They have everything they need for daily living.  She has husband as her caregiver.  She elevates her legs throughout the day.  She has some edema in lower legs, lungs are clear.  She did not weigh today.  She has all her medications, verified them.  She manages her own medications.  Will continue to visit for heart failure, diet and medication management.   [place] [phone]

## 2021-04-03 NOTE — Progress Notes (Deleted)
Date:  04/03/2021   ID:  EDDA OREA, DOB 11-28-1945, MRN [id number]  Patient Location:  [place] [address]   Provider location:   [facility], [place] office  PCP:  Earlie Counts, [place]  Cardiologist:  [facility]   No chief complaint on file.   History of Present Illness:    Karen [name] is a 76 y.o. 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 She presents for routine follow-up of her atrial fibrillation and heart failure  As seen by myself in clinic March 2022  Last office visit August 2021 In the hospital March 2022 for sepsis, records reviewed Urinary tract infection, was hypotensive, sodium 129 potassium of 3 creatinine 1.84 Treated with broad-spectrum antibiotics, IV fluids  Hemoglobin A1c 11.6 Insulin was increased Denies any indiscretion with her diet HCTZ held in the hospital  Weight 214 today Home 210 to 214  Legs weak, presents in wheelchair  EKG personally reviewed by myself on todays visit Normal sinus rhythm rate 83 bpm APCs   Other past medical history reviewed In the hospital 06/2019,  Hyponatremia: Na 127, up to 133 with fluids Rectal bleeding: suspected this is diverticular bleed. Hgb 10.9 --> 10.0 -->9.3--8.7.  Colonoscopy 08/2017 - Diverticulosis in the descending colon, in the transverse colon and in the ascending colon. A1c 8.4,   Junctional bradycardia on arrival to the hospital with hypotension Markedly elevated right heart pressures on echocardiogram,severe pulmonary HTN,  BNP was 800  Treated with Lasix IV twice daily Poor renal function on arrival, suspected cardiorenal syndrome Improvement of renal  function with diuresis Improvement of her abdominal bloating, cough improved, leg swelling improved    Past Medical History:  Diagnosis Date   (HFpEF) heart failure with preserved ejection fraction ([place])    a. 2017 Echo: EF 50%; b. 06/2018 Echo: EF 50-55%; c. 08/2018 Echo: EF 50-55%, Nl RV fxn; d. 09/2019 Echo: EF 55-60%, no rwma, mild LVH, Gr1 DD, nl RV size/fxn, PASP 63.88mmHg. Mildly dil LA. Triv MR. Mod AS (AoV 0.94cm^2 VTI; mean grad 17.50mmHg).   Acute on chronic respiratory failure with hypoxia and hypercapnia (HCC) 01/07/2015   Anemia    Asterixis 01/07/2015   Asthma    Cataract    CKD (chronic kidney disease), stage III (HCC)    COPD (chronic obstructive pulmonary disease) ([name])    a. 06/2018 tobacco use, home 3L oxygen    Diabetes mellitus without complication ([place])    a. 09/2019 A1C 8.8   Edema, peripheral 04/20/2014   GI bleed 06/28/2019   History of kidney stones    Hyperlipidemia    Hypertension    Iron deficiency anemia 06/22/2014   Junctional bradycardia    a. In setting of beta blocker therapy.   Leucocytosis 10/19/2015   Moderate aortic stenosis    a.  09/2019 Echo: Mod AS (AoV 0.94cm^2 VTI; mean grad 17.46mmHg).   Morbid obesity ([place])    Overactive bladder    Primary osteoarthritis of right knee 09/01/2016   Sciatica 01/07/2015   Past Surgical History:  Procedure Laterality Date   APPENDECTOMY     CESAREAN SECTION     x3   CHOLECYSTECTOMY  COLONOSCOPY WITH PROPOFOL N/A 08/28/2017   Procedure: COLONOSCOPY WITH PROPOFOL;  Surgeon: Lucilla Lame, MD;  Location[facility] ENDOSCOPY;  Service: Endoscopy;  Laterality: N/A;   COLONOSCOPY WITH PROPOFOL N/A 08/29/2017   Procedure: COLONOSCOPY WITH PROPOFOL;  Surgeon: Lucilla Lame, MD;  Location: [facility] ENDOSCOPY;  Service: Endoscopy;  Laterality: N/A;   CYSTOSCOPY W/ URETERAL STENT PLACEMENT Right 09/15/2017   Procedure: CYSTOSCOPY WITH RETROGRADE PYELOGRAM/URETERAL STENT PLACEMENT;  Surgeon: Cleon Gustin, MD;  Location: [facility]  ORS;  Service: Urology;  Laterality: Right;   CYSTOSCOPY/URETEROSCOPY/HOLMIUM LASER/STENT PLACEMENT Right 10/09/2017   Procedure: CYSTOSCOPY/URETEROSCOPY/HOLMIUM LASER/STENT PLACEMENT;  Surgeon: Abbie Sons, MD;  Location: [facility] ORS;  Service: Urology;  Laterality: Right;  right Stent exchange   ESOPHAGOGASTRODUODENOSCOPY (EGD) WITH PROPOFOL N/A 09/16/2018   Procedure: ESOPHAGOGASTRODUODENOSCOPY (EGD) WITH PROPOFOL;  Surgeon: Lin Landsman, MD;  Location: [place];  Service: Gastroenterology;  Laterality: N/A;   EYE SURGERY     IR THORACENTESIS ASP PLEURAL SPACE W/IMG GUIDE  02/07/2021     No outpatient medications have been marked as taking for the 04/04/21 encounter (Appointment) with [name], MD.     Allergies:   Ace inhibitors, Beta adrenergic blockers, Gabapentin, Lisinopril, Lyrica [pregabalin], and Shrimp [shellfish allergy]   Social History   Tobacco Use   Smoking status: Former    Packs/day: 1.00    Years: 20.00    Pack years: 20.00    Types: Cigarettes    Quit date: 12/04/1992    Years since quitting: 28.3   Smokeless tobacco: Never  Vaping Use   Vaping Use: Never used  Substance Use Topics   Alcohol use: No   Drug use: No      Family Hx: The patient's family history includes Other in her father and mother.  ROS:   Please see the history of present illness.    Review of Systems  Constitutional: Negative.   HENT: Negative.    Respiratory:  Positive for shortness of breath.   Cardiovascular:  Positive for leg swelling.  Gastrointestinal: Negative.   Musculoskeletal: Negative.   Neurological: Negative.   Psychiatric/Behavioral: Negative.    All other systems reviewed and are negative.    Labs/Other Tests and Data Reviewed:    Recent Labs: 02/02/2021: TSH 3.489 02/06/2021: ALT 9 02/07/2021: B Natriuretic Peptide 231.6 02/09/2021: Magnesium 1.8 02/12/2021: BUN 40; Creatinine, Ser 1.10; Hemoglobin 9.4; Platelets 237; Potassium 3.9; Sodium  137   Recent Lipid Panel No results found for: CHOL, TRIG, HDL, CHOLHDL, LDLCALC, LDLDIRECT  Wt Readings from Last 3 Encounters:  03/10/21 213 lb (96.6 kg)  02/12/21 218 lb 4.1 oz (99 kg)  12/02/20 224 lb (101.6 kg)     Exam:    There were no vitals taken for this visit.  Constitutional:  oriented to person, place, and time. No distress.  Presenting in a wheelchair HENT:  Head: Grossly normal Eyes:  no discharge. No scleral icterus.  Neck: No JVD, no carotid bruits  Cardiovascular: Regular rate and rhythm, no murmurs appreciated Pulmonary/Chest: Clear to auscultation bilaterally, no wheezes or rails Abdominal: Soft.  no distension.  no tenderness.  Musculoskeletal: Normal range of motion Neurological:  normal muscle tone. Coordination normal. No atrophy Skin: Skin warm and dry Psychiatric: normal affect, pleasant   ASSESSMENT & PLAN:    Chronic diastolic heart failure (HCC) - Weight at goal 214,  Will restart HCTZ (stopped in hospital for ATN)  Essential hypertension - Restart HCTZ as above  Type 2 diabetes mellitus without complication, with  long-term current use of insulin ([place]) - Long history of poor control diabetes, We have encouraged continued exercise, careful diet management in an effort to lose weight. Works with endocrine  Junctional bradycardia - All beta-blockers, calcium channel blockers held, Normal sinus rhythm today, stable  Extensive records reviewed, recent sepsis, dehydration, ATN  Total encounter time more than 35 minutes  Greater than 50% was spent in counseling and coordination of care with the patient     Signed, Ida Rogue, MD  04/03/2021 9:15 PM    [place] Office [address], [place], [place] [address]

## 2021-04-04 ENCOUNTER — Ambulatory Visit: Payer: Medicare Other | Admitting: Cardiovascular Disease

## 2021-04-04 DIAGNOSIS — I5032 Chronic diastolic (congestive) heart failure: Secondary | ICD-10-CM

## 2021-04-04 DIAGNOSIS — R001 Bradycardia, unspecified: Secondary | ICD-10-CM

## 2021-04-04 DIAGNOSIS — I10 Essential (primary) hypertension: Secondary | ICD-10-CM

## 2021-04-04 DIAGNOSIS — J9612 Chronic respiratory failure with hypercapnia: Secondary | ICD-10-CM

## 2021-04-04 DIAGNOSIS — I482 Chronic atrial fibrillation, unspecified: Secondary | ICD-10-CM

## 2021-04-04 DIAGNOSIS — E1165 Type 2 diabetes mellitus with hyperglycemia: Secondary | ICD-10-CM

## 2021-04-05 ENCOUNTER — Encounter: Payer: Self-pay | Admitting: Cardiovascular Disease

## 2021-04-06 ENCOUNTER — Other Ambulatory Visit: Payer: Self-pay | Admitting: Cardiovascular Disease

## 2021-04-06 NOTE — Telephone Encounter (Signed)
Please review

## 2021-04-06 NOTE — Telephone Encounter (Signed)
Eliquis 5 mg refill request received. Patient is 76 years old, weight- 96.6 kg, Crea- 1.1 on 02/12/21, Diagnosis- afib, and last seen by Dr. Rockey Situ on 05/31/20. Dose is appropriate based on dosing criteria. Will send in refill to requested pharmacy.

## 2021-04-19 ENCOUNTER — Other Ambulatory Visit: Payer: Self-pay

## 2021-04-19 ENCOUNTER — Ambulatory Visit: Payer: Medicare Other | Attending: Family | Admitting: Family

## 2021-04-19 ENCOUNTER — Encounter: Payer: Self-pay | Admitting: Family

## 2021-04-19 VITALS — BP 86/44 | HR 71 | Resp 18 | Ht 63.0 in

## 2021-04-19 DIAGNOSIS — I952 Hypotension due to drugs: Secondary | ICD-10-CM | POA: Diagnosis not present

## 2021-04-19 DIAGNOSIS — E1122 Type 2 diabetes mellitus with diabetic chronic kidney disease: Secondary | ICD-10-CM | POA: Diagnosis not present

## 2021-04-19 DIAGNOSIS — Z9981 Dependence on supplemental oxygen: Secondary | ICD-10-CM | POA: Diagnosis not present

## 2021-04-19 DIAGNOSIS — I959 Hypotension, unspecified: Secondary | ICD-10-CM | POA: Insufficient documentation

## 2021-04-19 DIAGNOSIS — E1165 Type 2 diabetes mellitus with hyperglycemia: Secondary | ICD-10-CM

## 2021-04-19 DIAGNOSIS — I5032 Chronic diastolic (congestive) heart failure: Secondary | ICD-10-CM | POA: Diagnosis not present

## 2021-04-19 DIAGNOSIS — I130 Hypertensive heart and chronic kidney disease with heart failure and stage 1 through stage 4 chronic kidney disease, or unspecified chronic kidney disease: Secondary | ICD-10-CM | POA: Diagnosis present

## 2021-04-19 DIAGNOSIS — E785 Hyperlipidemia, unspecified: Secondary | ICD-10-CM | POA: Diagnosis not present

## 2021-04-19 DIAGNOSIS — J449 Chronic obstructive pulmonary disease, unspecified: Secondary | ICD-10-CM

## 2021-04-19 DIAGNOSIS — D649 Anemia, unspecified: Secondary | ICD-10-CM | POA: Insufficient documentation

## 2021-04-19 DIAGNOSIS — N183 Chronic kidney disease, stage 3 unspecified: Secondary | ICD-10-CM | POA: Diagnosis not present

## 2021-04-19 DIAGNOSIS — Z79899 Other long term (current) drug therapy: Secondary | ICD-10-CM | POA: Diagnosis not present

## 2021-04-19 DIAGNOSIS — M2550 Pain in unspecified joint: Secondary | ICD-10-CM | POA: Insufficient documentation

## 2021-04-19 DIAGNOSIS — E1136 Type 2 diabetes mellitus with diabetic cataract: Secondary | ICD-10-CM | POA: Diagnosis not present

## 2021-04-19 DIAGNOSIS — Z794 Long term (current) use of insulin: Secondary | ICD-10-CM

## 2021-04-19 NOTE — Patient Instructions (Signed)
Do not take your cozaar (losartan) on Wednesday morning.  Take your blood pressure tomorrow, and call us to tell us what it is.   Return in 1 month.

## 2021-04-19 NOTE — Progress Notes (Signed)
Patient ID: Karen [name], female    DOB: Jul 30, 1945, 76 y.o.   MRN: [id number]  Karen [name] is a 76 y/o female with a history of HTN, CKD, DM, asthma, hyperlipidemia, COPD, anemia and chronic heart failure.    Echo report from 02/06/21 reviewed and showed an EF of 60-65%, no LVR. RV mildly enlarged, severely elevated PA systolic pressure.  Echo report from 02/14/2019 reviewed and showed an EF of 65-70% without LVH along with trivial MR/TR and moderately elevated PA pressure.   Admitted 02/01/21 11 days for HF exacerbation, COPD exacerbation and encephalopathy. Discharged to SNF.   She presents today for a follow-up visit with a chief complaint of moderate fatigue upon minimal exertion, and SOB at rest. She describes this as chronic, although slightly improved since her recent hospital discharge. She has associated pedal edema and fluctuating weight gain along with this. She denies any CP, difficulty sleeping, dizziness, abdominal distention, palpitations, or cough.   Weighs her self daily, but unable to weigh in clinic. Her weight at home was 231 today. She checks her BP daily but cannot recall what it normally runs. In the clinic today, 86/44, rechecked 84/42. She reports her BP is "never that low".   Past Medical History:  Diagnosis Date   (HFpEF) heart failure with preserved ejection fraction ([place])    a. 2017 Echo: EF 50%; b. 06/2018 Echo: EF 50-55%; c. 08/2018 Echo: EF 50-55%, Nl RV fxn; d. 09/2019 Echo: EF 55-60%, no rwma, mild LVH, Gr1 DD, nl RV size/fxn, PASP 63.61mmHg. Mildly dil LA. Triv MR. Mod AS (AoV 0.94cm^2 VTI; mean grad 17.5mmHg).   Acute on chronic respiratory failure with hypoxia and hypercapnia (HCC) 01/07/2015   Anemia    Asterixis 01/07/2015   Asthma    Cataract    CKD (chronic kidney disease), stage III (HCC)    COPD (chronic obstructive pulmonary disease) ([place])    a. 06/2018 tobacco use, home 3L oxygen    Diabetes mellitus without complication ([place])    a. 09/2019 A1C 8.8    Edema, peripheral 04/20/2014   GI bleed 06/28/2019   History of kidney stones    Hyperlipidemia    Hypertension    Iron deficiency anemia 06/22/2014   Junctional bradycardia    a. In setting of beta blocker therapy.   Leucocytosis 10/19/2015   Moderate aortic stenosis    a.  09/2019 Echo: Mod AS (AoV 0.94cm^2 VTI; mean grad 17.9mmHg).   Morbid obesity ([place])    Overactive bladder    Primary osteoarthritis of right knee 09/01/2016   Sciatica 01/07/2015   Past Surgical History:  Procedure Laterality Date   APPENDECTOMY     CESAREAN SECTION     x3   CHOLECYSTECTOMY     COLONOSCOPY WITH PROPOFOL N/A 08/28/2017   Procedure: COLONOSCOPY WITH PROPOFOL;  Surgeon: Lucilla Lame, MD;  Location: [facility] ENDOSCOPY;  Service: Endoscopy;  Laterality: N/A;   COLONOSCOPY WITH PROPOFOL N/A 08/29/2017   Procedure: COLONOSCOPY WITH PROPOFOL;  Surgeon: Lucilla Lame, MD;  Location: [facility] ENDOSCOPY;  Service: Endoscopy;  Laterality: N/A;   CYSTOSCOPY W/ URETERAL STENT PLACEMENT Right 09/15/2017   Procedure: CYSTOSCOPY WITH RETROGRADE PYELOGRAM/URETERAL STENT PLACEMENT;  Surgeon: Cleon Gustin, MD;  Location: [facility] ORS;  Service: Urology;  Laterality: Right;   CYSTOSCOPY/URETEROSCOPY/HOLMIUM LASER/STENT PLACEMENT Right 10/09/2017   Procedure: CYSTOSCOPY/URETEROSCOPY/HOLMIUM LASER/STENT PLACEMENT;  Surgeon: Abbie Sons, MD;  Location: [facility] ORS;  Service: Urology;  Laterality: Right;  right Stent exchange   ESOPHAGOGASTRODUODENOSCOPY (EGD) WITH PROPOFOL  N/A 09/16/2018   Procedure: ESOPHAGOGASTRODUODENOSCOPY (EGD) WITH PROPOFOL;  Surgeon: Lin Landsman, MD;  Location: [facility] ENDOSCOPY;  Service: Gastroenterology;  Laterality: N/A;   EYE SURGERY     IR THORACENTESIS ASP PLEURAL SPACE W/IMG GUIDE  02/07/2021   Family History  Problem Relation Age of Onset   Other Mother        unknown medical history   Other Father        unknown medical history   Social History   Tobacco Use   Smoking status: Former     Packs/day: 1.00    Years: 20.00    Pack years: 20.00    Types: Cigarettes    Quit date: 12/04/1992    Years since quitting: 28.3   Smokeless tobacco: Never  Substance Use Topics   Alcohol use: No   Allergies  Allergen Reactions   Ace Inhibitors Hives   Beta Adrenergic Blockers     Junctional bradycardia   Gabapentin Hives   Lisinopril Hives   Lyrica [Pregabalin] Hives   Shrimp [Shellfish Allergy] Swelling    Swelling of the lips   Prior to Admission medications   Medication Sig Start Date End Date Taking? Authorizing Provider  albuterol (VENTOLIN HFA) 108 (90 Base) MCG/ACT inhaler INHALE 2 PUFFS BY MOUTH EVERY 6 HOURS AS NEEDED FOR WHEEZING 01/02/19  Yes [provider]  apixaban (ELIQUIS) 5 MG TABS tablet Take 1 tablet (5 mg total) by mouth 2 (two) times daily. 04/09/19  Yes [name], MD  atorvastatin (LIPITOR) 10 MG tablet Take 1 tablet by mouth daily.   Yes [provider]  esomeprazole (NEXIUM) 40 MG capsule Take 40 mg by mouth daily. 12/17/18  Yes [provider]  Ferrous Sulfate (IRON) 325 (65 Fe) MG TABS Take 1 tablet by mouth daily. 09/14/17  Yes [provider]  fluticasone (FLONASE) 50 MCG/ACT nasal spray Place 2 sprays into both nostrils daily. 11/15/18  Yes [provider]  Fluticasone-Umeclidin-Vilant 100-62.5-25 MCG/INH AEPB Inhale 1 puff into the lungs daily. 12/25/17  Yes [provider]  furosemide (LASIX) 20 MG tablet Take 2 tablets (40 mg total) by mouth as directed. Take 2 tablets (40 mg) in the AM and extra 1-2 tablets (20-40 mg) in the afternoon for leg swelling or shortness of breath 07/07/19 10/05/19 Yes Gollan, Kathlene November, MD  Insulin Human (INSULIN PUMP) SOLN Inject 1 each into the skin 3 times daily with meals, bedtime and 2 AM. Patient taking differently: Inject 1 each into the skin 3 times daily with meals, bedtime and 2 AM. Humalog Insulin 0-76 units daily insulin pump 09/18/17  Yes Dustin Flock,  MD  ipratropium-albuterol (DUONEB) 0.5-2.5 (3) MG/3ML SOLN Inhale 3 mLs into the lungs 4 (four) times daily. 02/05/19  Yes [provider]  losartan-hydrochlorothiazide (HYZAAR) 100-25 MG tablet Take 1 tablet by mouth daily. 01/05/19  Yes [provider]  metFORMIN (GLUCOPHAGE) 500 MG tablet Take 1,000 mg by mouth 2 (two) times daily with a meal.    Yes [provider]  montelukast (SINGULAIR) 10 MG tablet Take 10 mg by mouth at bedtime.   Yes [provider]  potassium chloride (KLOR-CON) 10 MEQ tablet Take 1 tablet (10 mEq total) by mouth as directed. Take one tablet daily and when you take extra furosemide take extra tablet 07/07/19  Yes Gollan, Kathlene November, MD  vitamin B-12 (CYANOCOBALAMIN) 500 MCG tablet Take 500 mcg by mouth daily.   Yes [provider]  Review of Systems  Constitutional:  Positive for fatigue. Negative for appetite change.  HENT:  Negative for congestion, postnasal drip and sore throat.   Eyes: Negative.   Respiratory:  Positive for cough and shortness of breath.   Cardiovascular:  Positive for leg swelling. Negative for chest pain and palpitations.  Gastrointestinal:  Negative for abdominal distention and abdominal pain.  Endocrine: Negative.   Genitourinary: Negative.  Negative for difficulty urinating, dyspareunia, dysuria, flank pain, frequency, hematuria and urgency.  Musculoskeletal:  Positive for arthralgias. Negative for back pain and neck pain.  Allergic/Immunologic: Negative.   Neurological:  Negative for dizziness, syncope, weakness and light-headedness.  Hematological:  Negative for adenopathy. Does not bruise/bleed easily.  Psychiatric/Behavioral:  Negative for dysphoric mood and sleep disturbance (sleeping on 2 pillows). The patient is not nervous/anxious.    Vitals:   04/19/21 1353 04/19/21 1404  BP: (!) 84/42 (!) 86/44  Pulse: 71   Resp: 18   SpO2: 99%   Height: 5\' 3"  (1.6 m)    Wt Readings from Last 3  Encounters:  03/10/21 213 lb (96.6 kg)  02/12/21 218 lb 4.1 oz (99 kg)  12/02/20 224 lb (101.6 kg)   Lab Results  Component Value Date   CREATININE 1.10 (H) 02/12/2021   CREATININE 1.11 (H) 02/11/2021   CREATININE 0.82 02/10/2021    Physical Exam Vitals and nursing note reviewed. Exam conducted with a chaperone present (husband).  Constitutional:      General: She is not in acute distress.    Appearance: Normal appearance. She is ill-appearing.  HENT:     Head: Normocephalic and atraumatic.  Cardiovascular:     Rate and Rhythm: Normal rate and regular rhythm.     Pulses: Normal pulses.  Pulmonary:     Effort: Pulmonary effort is normal. No respiratory distress.     Breath sounds: No wheezing or rales.  Abdominal:     General: There is no distension.     Palpations: Abdomen is soft.     Tenderness: There is no abdominal tenderness.  Musculoskeletal:        General: No tenderness.     Cervical back: Normal range of motion.     Right lower leg: Edema (1+ pitting) present.     Left lower leg: Edema (1+ pitting) present.  Skin:    General: Skin is warm and dry.  Neurological:     General: No focal deficit present.     Mental Status: She is alert and oriented to person, place, and time.  Psychiatric:        Mood and Affect: Mood normal.        Behavior: Behavior normal.    Assessment & Plan:  1: Chronic heart failure with preserved ejection fraction without structural changes- - NYHA class III - euvolemic today - weighing daily; reminded to call for an overnight weight gain of >2 pounds or a weekly weight gain of >5 pounds - weight up 4 lbs from her last visit 18 months ago per her home weight - participating in paramedicine program, will notify that her BP was low and to check in with her when she is able to - not adding salt to her food - saw cardiology Rockey Situ) 07/07/19 - BNP 02/07/21 was 234  2: Hypotension- - 86/44, rechecked 84/42 - advised to hold cozaar on  2/15, check her BP and call clinic to report BP - denies fever, malaise, chills - further denies dysuria, malodorous, denies increased frequency  3:  DM- - saw endocrinology Honor Junes) 04/14/21 - A1c 02/03/21 was 9.8% - blood sugar at home this am 399  4: COPD- - wearing oxygen at 3L around the clock   Patient did not bring her medications nor a list. Each medication was verbally reviewed with the patient and she was encouraged to bring the bottles to every visit to confirm accuracy of list.  Return in 1 month or sooner for any questions/problems before then.

## 2021-04-20 ENCOUNTER — Telehealth: Payer: Self-pay | Admitting: Family

## 2021-04-20 ENCOUNTER — Encounter: Payer: Self-pay | Admitting: Family

## 2021-04-20 NOTE — Telephone Encounter (Signed)
Patient's husband called to report that patient's BP this morning was 118/52 and she hasn't taken any of her medications yet today. Advised her husband to cut her losartan in half and take 1/2 tablet daily (25mg ).   Continue to check BP daily and if the top number gets <100, to call us back.   He verbalized understanding

## 2021-04-21 ENCOUNTER — Other Ambulatory Visit: Payer: Self-pay | Admitting: Family

## 2021-04-21 ENCOUNTER — Other Ambulatory Visit (HOSPITAL_COMMUNITY): Payer: Self-pay

## 2021-04-21 ENCOUNTER — Encounter (HOSPITAL_COMMUNITY): Payer: Self-pay

## 2021-04-21 DIAGNOSIS — I5032 Chronic diastolic (congestive) heart failure: Secondary | ICD-10-CM

## 2021-04-21 NOTE — Progress Notes (Signed)
Will send referral to palliative care for chronic care management. Patient is agreeable to this.

## 2021-04-21 NOTE — Progress Notes (Signed)
Today had a home visit with Karen [name].  She is stating feeling good.  She states blood pressure in past 2 mornings have been over 110/, she checks it every morning before her meds.  She has reduced her losartan to 1/2 since HF clinic visit when her blood pressure was low.  She denies any dizziness, headaches or increased shortness of breath.  She states she does not feel no different when blood pressure goes to 80's.  Her sugars are still fluctuating.  They are still interested in [place] with HF clinic will send referral.  She has all her medications and aware of how to take them.  Gave her several cases of depends to help them out, they have to pay out of pocket for them.  They would like to have someone come in a couple of hours a day to help with baths, etc if they could qualify for that.  They have everything for daily living.  Edema in legs are down.  Lungs are clear.  She is waiting for sensors to check her sugars, in the meantime they bought some strips for glucometer.  She check it frequent.  Will continue to visit for heart failure, diet and medication compliance.   [place] [phone]

## 2021-04-22 ENCOUNTER — Telehealth: Payer: Self-pay

## 2021-04-22 NOTE — Telephone Encounter (Signed)
Spoke with patient's husband Karen [name] and scheduled a Telephonic Palliative Consult for 04/25/21 @ 12:30PM.   Consent obtained; updated Outlook/Netsmart/Team List and Epic.

## 2021-04-25 ENCOUNTER — Other Ambulatory Visit: Payer: Medicare Other | Admitting: Primary Care

## 2021-04-25 ENCOUNTER — Other Ambulatory Visit: Payer: Self-pay

## 2021-04-25 DIAGNOSIS — Z515 Encounter for palliative care: Secondary | ICD-10-CM

## 2021-04-25 NOTE — Progress Notes (Signed)
° ° °  [facility] Consult Note Telephone: [phone]  Fax: [phone]    Date of encounter: 04/25/21 PATIENT NAME: Karen [name] [address] [place] [address]   [phone] (home)  DOB: 19-Sep-1945 MRN: [id number] PRIMARY CARE PROVIDER:    Earlie Counts, FNP,  No address on file None  REFERRING PROVIDER:   D, Ples Specter, FNP No address on file N/A  RESPONSIBLE PARTY:    Contact Information     Name Relation Home Work Mobile   Karen [name] Spouse   [phone]   Karen [name] [phone]     Karen [name] Granddaughter [phone]         Due to the COVID-19 crisis, this visit was done via telemedicine from my office and it was initiated and consent by this patient and or family.  I connected with  Karen [name] OR PROXY on 04/25/21 by a  telemedicine application and verified that I am speaking with the correct person using two identifiers.   I discussed the limitations of evaluation and management by telemedicine. The patient expressed understanding and agreed to proceed.   Palliative Care was asked to follow this patient by consultation request of  D, Ples Specter, FNP to address advance care planning.                                   ASSESSMENT AND PLAN / RECOMMENDATIONS:   Advance Care Planning/Goals of Care: Goals include to maximize quality of life and symptom management. Patient/health care surrogate gave his/her permission to discuss.Our advance care planning conversation included a discussion about:     Exploration of personal, cultural or spiritual beliefs that might influence medical decisions  CODE STATUS: DNR Not video enabled and not able to interview patient. Family was amenable to rescheduling for in person visit. Husband outlined caregiver strain and need for in person assessment for P[facility]   Follow up Palliative Care Visit: Palliative care will continue to follow for complex medical decision  making, advance care planning, and clarification of goals. Return 1 weeks or prn.  I spent 10 minutes providing this consultation. More than 50% of the time in this consultation was spent in counseling and care coordination.  Thank you for the opportunity to participate in the care of Karen [name].  The palliative care team will continue to follow. Please call our office at [phone] if we can be of additional assistance.   Karen Coop, NP ,   COVID-19 PATIENT SCREENING TOOL Asked and negative response unless otherwise noted:   Have you had symptoms of covid, tested positive or been in contact with someone with symptoms/positive test in the past 5-10 days?

## 2021-04-27 ENCOUNTER — Other Ambulatory Visit: Payer: Medicare Other | Admitting: Primary Care

## 2021-04-27 ENCOUNTER — Other Ambulatory Visit: Payer: Self-pay

## 2021-04-27 DIAGNOSIS — E119 Type 2 diabetes mellitus without complications: Secondary | ICD-10-CM

## 2021-04-27 DIAGNOSIS — J9612 Chronic respiratory failure with hypercapnia: Secondary | ICD-10-CM

## 2021-04-27 DIAGNOSIS — M1711 Unilateral primary osteoarthritis, right knee: Secondary | ICD-10-CM

## 2021-04-27 DIAGNOSIS — Z515 Encounter for palliative care: Secondary | ICD-10-CM

## 2021-04-27 DIAGNOSIS — I5032 Chronic diastolic (congestive) heart failure: Secondary | ICD-10-CM

## 2021-04-27 DIAGNOSIS — D509 Iron deficiency anemia, unspecified: Secondary | ICD-10-CM

## 2021-04-27 DIAGNOSIS — R918 Other nonspecific abnormal finding of lung field: Secondary | ICD-10-CM

## 2021-04-27 NOTE — Progress Notes (Signed)
[facility] Palliative Care Consult Note Telephone: [phone]  Fax: [phone]   Date of encounter: 04/27/21 1:42 PM PATIENT NAME: Karen [name] [address] [place] [address]   [phone] (home)  DOB: Apr 01, 1945 MRN: [id number] PRIMARY CARE PROVIDER:    Margarita Rana, [place] [address] [phone]   REFERRING PROVIDER:    Alisa [name], [address] [place],  [place] [address] [phone]   RESPONSIBLE PARTY:    Contact Information     Name Relation Home Work Mobile   Karen [name] Spouse   [phone]   Karen [name] [phone]     [facility] Granddaughter [phone]         I met face to face with patient and family in  home. Palliative Care was asked to follow this patient by consultation request of  Karen [name]  to address advance care planning and complex medical decision making. This is the initial visit.                                     ASSESSMENT AND PLAN / RECOMMENDATIONS:   Advance Care Planning/Goals of Care: Goals include to maximize quality of life and symptom management. Patient/health care surrogate gave his/her permission to discuss.Our advance care planning conversation included a discussion about:    The value and importance of advance care planning  Experiences with loved ones who have been seriously ill or have died  Exploration of personal, cultural or spiritual beliefs that might influence medical decisions  Exploration of goals of care in the event of a sudden injury or illness  Identification of a healthcare agent - husband  creation of an  advance directive document Decision not to resuscitate due to poor prognosis. Patient was  adamant she did not want resuscitation. Husband felt she did but she did tell him during our discussion her wishes were for DNR if her heart stopped. CODE STATUS: DNR I completed a MOST form today.  The patient and family outlined their wishes for the following treatment decisions:  Cardiopulmonary Resuscitation: Do Not Attempt Resuscitation (DNR/No CPR)  Medical Interventions: Full Scope of Treatment: Use intubation, advanced airway interventions, mechanical ventilation, cardioversion as indicated, medical treatment, IV fluids, etc, also provide comfort measures. Transfer to the hospital if indicated  Antibiotics: Antibiotics if indicated  IV Fluids: IV fluids if indicated  Feeding Tube: No feeding tube   I spent 20 minutes providing this consultation. More than 50% of the time in this consultation was spent in counseling and care coordination.  -----------------------------------------------------------------------------------------------------------------  Symptom Management/Plan:  I am assessing today for PCS services.  Patient needs assistance with most IADLS and most ADLS. Needs assistance with bathing, dressing, laundry, toileting.  Self care deficits: needs help with adls. Needs help with bathing and dressing, meal prep and is dependent in meal prep. Can feed self with set up.  Pt walks with walker Can rise alone. Had a fall out of bed several times, but not recently. Has ramp for egress.  Goes out with  taxing effort. Uses w/c  to go to car and needs help transferring in.  Husband voices significant caregiver strain as he needs to run errands and go to appointments for his own care.   DM/Nutrition:  Taking insulin at meals, and uses meal time sliding scale. Glucose around 200, Last A1C  was 9.8%. Recently had knee injection. Currently 228. No alarm  is set. Has reached target glucose 20% of time but is over target 80% of time. Education provided  Loose stools: Endorses, has immodium to go out as she will often have bowel incontinence  Pain: endorses knee pain from OA, has had injection yesterday in one knee and will have in the other in several weeks.   Follow up Palliative Care  Visit: Palliative care will continue to follow for complex medical decision making, advance care planning, and clarification of goals. Return 6 weeks or prn.  This visit was coded based on medical decision making (MDM).  PPS: 40%  HOSPICE ELIGIBILITY/DIAGNOSIS: TBD  Chief Complaint: debility, self care deficit  HISTORY OF PRESENT ILLNESS:  [name] is a 76 y.o. year old female  with CHF, COPD, oxygen dependence, anemia and insulin dependent diabetes. She endorses debility and need for PCS assistance for her adls. Husband is care giver and voices strain .   History obtained from review of EMR, discussion with primary team, and interview with family, facility staff/caregiver and/or Karen [name].  I reviewed available labs, medications, imaging, studies and related documents from the EMR.  Records reviewed and summarized above.   ROS   General: NAD EYES: denies vision changes ENMT: denies dysphagia Cardiovascular: denies chest pain, denies DOE Pulmonary: denies cough, denies increased SOB Abdomen: endorses good appetite, denies constipation, endorses diarrhea chronic ,endorses  incontinence of bowel GU: denies dysuria, endorses incontinence of urine, has purwick MSK:  endorses  increased weakness,  no  recent falls reported Skin: denies rashes or wounds Neurological: endorses knee pain, denies insomnia Psych: Endorses positive mood Heme/lymph/immuno: denies bruises, abnormal bleeding  Physical Exam: Current and past weights: 217 lbs  Constitutional: NAD, 112/56 HR 68 RR 18 General: frail appearing, obese  EYES: anicteric sclera, lids intact, no discharge  ENMT: intact hearing, oral mucous membranes moist, dentition missing CV: S1S2, RRR, 1+ LE edema Pulmonary: LCTA, no increased work of breathing, no cough, oxygen 3 L  Abdomen: intake 75%, normo-active BS + 4 quadrants, soft and non tender, no ascites GU: deferred MSK: mild sarcopenia, moves all extremities, ambulatory  with walker  Skin: warm and dry, no rashes or wounds on visible skin Neuro:  + generalized weakness,    no new cognitive impairment Psych: non-anxious affect, A and O x 3 Hem/lymph/immuno: no widespread bruising CURRENT PROBLEM LIST:  Patient Active Problem List   Diagnosis Date Noted   Pleural effusion    Acute lower UTI    Acute metabolic encephalopathy    Acute kidney injury superimposed on CKD ([place])    Pneumonia due to COVID-19 virus 10/29/2020   Pulmonary nodules/lesions, multiple 10/29/2020   Aortic stenosis, moderate    AF (paroxysmal atrial fibrillation) (HCC)    Gastroesophageal reflux disease without esophagitis    Acute CHF (congestive heart failure) ([place]) 06/17/2020   Chronic respiratory failure with hypercapnia ([place]) 05/21/2020   Severe sepsis ([place]) 05/21/2020   UTI (urinary tract infection) 05/21/2020   Hyperglycemia due to type 2 diabetes mellitus ([place]) 05/21/2020   Abnormal CT of liver 05/21/2020   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 05/21/2020   Class 2 severe obesity with serious comorbidity and body mass index (BMI) of 38.0 to 38.9 in adult ([place]) 05/08/2020   Acute hip pain, left 10/23/2019   Lumbar stenosis with neurogenic claudication 10/23/2019   COPD with acute exacerbation ([place]) 75/44/9201   Acute diastolic CHF (congestive heart  failure) ([place]) 09/28/2019   (HFpEF) heart failure with preserved ejection fraction ([place]) 09/27/2019   Rectal bleeding 06/28/2019   Hypokalemia 06/28/2019   Hyponatremia 06/28/2019   Type 2 diabetes mellitus with hyperlipidemia ([place]) 06/28/2019   CKD (chronic kidney disease), stage IIIa 06/28/2019   Atrial fibrillation, chronic (HCC) 06/28/2019   Pulmonary edema 02/27/2019   Bradycardia 02/24/2019   Acute on chronic diastolic CHF (congestive heart failure) ([place]) 02/23/2019   Chronic respiratory failure with hypoxia ([place]) 02/23/2019   Atypical chest pain 02/13/2019   Osteopenia of neck of left femur 11/20/2018   AVM  (arteriovenous malformation) of small bowel, acquired    Acute gastric ulcer with hemorrhage    Chronic diastolic heart failure ([place]) 08/22/2018   Diarrhea 08/22/2018   Junctional bradycardia    Acute on chronic heart failure with preserved ejection fraction (HFpEF) ([place])    AKI (acute kidney injury) ([place])    Symptomatic bradycardia 08/05/2018   Acute on chronic respiratory failure ([place]) 06/25/2018   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 diabetes mellitus ([place]) 01/25/2018   History of non anemic vitamin B12 deficiency 01/25/2018   Personal history of kidney stones 11/12/2017   Urge incontinence 11/12/2017   History of leukocytosis 09/17/2017   Right ureteral stone 09/15/2017   Acute GI bleeding    GI bleed 08/26/2017   Arthritis 08/10/2017   Stage 4 chronic kidney disease ([place]) 08/10/2017   COPD (chronic obstructive pulmonary disease) ([place]) 08/10/2017   Diabetes mellitus type 2, uncomplicated ([place]) 97/41/6384   Hypertension 08/10/2017   Obesity (BMI 35.0-39.9 without comorbidity) 04/11/2017   Primary osteoarthritis of right knee 09/01/2016   Leucocytosis 10/19/2015   Asterixis 01/07/2015   Acute on chronic respiratory failure with hypoxia and hypercapnia (HCC) 01/07/2015   Sciatica 01/07/2015   Weakness 01/07/2015   Chronic 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 01/04/2015   Iron deficiency anemia 06/22/2014   Microalbuminuria 06/22/2014   CHF (congestive heart failure) ([place]) 04/20/2014   Edema, peripheral 04/20/2014   PAST MEDICAL HISTORY:  Active Ambulatory Problems    Diagnosis Date Noted   Asterixis 01/07/2015   Acute on chronic respiratory failure with hypoxia and hypercapnia (HCC) 01/07/2015   Sciatica 01/07/2015   Weakness 01/07/2015   Leucocytosis 10/19/2015   Arthritis 08/10/2017   CHF (congestive heart failure) ([place]) 04/20/2014   Stage 4 chronic kidney disease ([place]) 08/10/2017   Chronic 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  01/04/2015   COPD (chronic obstructive pulmonary disease) ([place]) 08/10/2017   Diabetes mellitus type 2, uncomplicated ([place]) 53/64/6803   Edema, peripheral 04/20/2014   Hypertension 08/10/2017   Iron deficiency anemia 06/22/2014   Microalbuminuria 06/22/2014   Obesity (BMI 35.0-39.9 without comorbidity) 04/11/2017   Primary osteoarthritis of right knee 09/01/2016   GI bleed 08/26/2017   Acute GI bleeding    Right ureteral stone 09/15/2017   Personal history of kidney stones 11/12/2017   Urge incontinence 11/12/2017   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 diabetes mellitus ([place]) 01/25/2018   History of non anemic vitamin B12 deficiency 01/25/2018   Acute on chronic respiratory failure ([place]) 06/25/2018   Symptomatic bradycardia 08/05/2018   Junctional bradycardia    Acute on chronic heart failure with preserved ejection fraction (HFpEF) (HCC)    AKI (acute kidney injury) ([place])    Chronic diastolic heart failure ([place]) 08/22/2018   Diarrhea 08/22/2018   AVM (arteriovenous malformation) of small bowel, acquired    Acute gastric ulcer with hemorrhage    Atypical  chest pain 02/13/2019   Acute on chronic diastolic CHF (congestive heart failure) ([place]) 02/23/2019   Chronic respiratory failure with hypoxia ([place]) 02/23/2019   Bradycardia 02/24/2019   Pulmonary edema 02/27/2019   Rectal bleeding 06/28/2019   Hypokalemia 06/28/2019   Hyponatremia 06/28/2019   Type 2 diabetes mellitus with hyperlipidemia ([place]) 06/28/2019   CKD (chronic kidney disease), stage IIIa 06/28/2019   Atrial fibrillation, chronic ([place]) 06/28/2019   (HFpEF) heart failure with preserved ejection fraction ([place]) 09/27/2019   COPD with acute exacerbation ([place]) 01/65/5374   Acute diastolic CHF (congestive heart failure) ([place]) 09/28/2019   Acute hip pain, left 10/23/2019   Lumbar stenosis with neurogenic claudication 10/23/2019   Osteopenia of neck of left femur 11/20/2018   History of leukocytosis  09/17/2017   Class 2 severe obesity with serious comorbidity and body mass index (BMI) of 38.0 to 38.9 in adult [facility]) 05/08/2020   Chronic respiratory failure with hypercapnia ([place]) 05/21/2020   Severe sepsis ([place]) 05/21/2020   UTI (urinary tract infection) 05/21/2020   Hyperglycemia due to type 2 diabetes mellitus ([place]) 05/21/2020   Abnormal CT of liver 05/21/2020   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 05/21/2020   Acute CHF (congestive heart failure) ([place]) 06/17/2020   Aortic stenosis, moderate    AF (paroxysmal atrial fibrillation) (HCC)    Gastroesophageal reflux disease without esophagitis    Pneumonia due to COVID-19 virus 10/29/2020   Pulmonary nodules/lesions, multiple 10/29/2020   Pleural effusion    Acute lower UTI    Acute metabolic encephalopathy    Acute kidney injury superimposed on CKD [facility])    Resolved Ambulatory Problems    Diagnosis Date Noted   Sepsis ([place]) 05/21/2020   Past Medical History:  Diagnosis Date   Anemia    Asthma    Cataract    Diabetes mellitus without complication ([place])    History of kidney stones    Hyperlipidemia    Moderate aortic stenosis    Morbid obesity ([place])    Overactive bladder    SOCIAL HX:  Social History   Tobacco Use   Smoking status: Former    Packs/day: 1.00    Years: 20.00    Pack years: 20.00    Types: Cigarettes    Quit date: 12/04/1992    Years since quitting: 28.4   Smokeless tobacco: Never  Substance Use Topics   Alcohol use: No   FAMILY HX:  Family History  Problem Relation Age of Onset   Other Mother        unknown medical history   Other Father        unknown medical history      ALLERGIES:  Allergies  Allergen Reactions   Ace Inhibitors Hives   Beta Adrenergic Blockers     Junctional bradycardia   Gabapentin Hives   Lisinopril Hives   Lyrica [Pregabalin] Hives   Shrimp [Shellfish Allergy] Swelling    Swelling of the lips     PERTINENT MEDICATIONS:  Outpatient Encounter Medications as  of 04/27/2021  Medication Sig   acetaminophen (TYLENOL) 500 MG tablet Take 1-2 tablets (500-1,000 mg total) by mouth every 6 (six) hours as needed for mild pain, fever, moderate pain or headache. Do not take more than 4 grams a day   albuterol (VENTOLIN HFA) 108 (90 Base) MCG/ACT inhaler Inhale 2 puffs into the lungs every 6 (six) hours as needed for wheezing or shortness of breath.    allopurinol (ZYLOPRIM) 100 MG tablet Take 1  tablet by mouth daily.   apixaban (ELIQUIS) 5 MG TABS tablet Take 1 tablet by mouth twice daily   atorvastatin (LIPITOR) 10 MG tablet Take 1 tablet by mouth daily.   Dulaglutide 3 MG/0.5ML SOPN Inject 0.5 mLs into the skin once a week.   esomeprazole (NEXIUM) 40 MG capsule Take 40 mg by mouth daily.   Ferrous Sulfate (IRON) 325 (65 Fe) MG TABS Take 1 tablet (325 mg total) by mouth daily.   fluticasone (FLONASE) 50 MCG/ACT nasal spray Place 2 sprays into both nostrils daily.   Fluticasone-Umeclidin-Vilant 100-62.5-25 MCG/INH AEPB Inhale 1 puff into the lungs daily.   furosemide (LASIX) 20 MG tablet 2 tabs po in morning and 1 tab po in afternoon (may take extra tab in afternoon if gains 2 lbs in a day)   hydrochlorothiazide (HYDRODIURIL) 25 MG tablet Take 25 mg by mouth daily.   insulin glargine (LANTUS SOLOSTAR) 100 UNIT/ML Solostar Pen Inject 32 Units into the skin daily.   insulin lispro (HUMALOG) 100 UNIT/ML KwikPen Inject 10 Units into the skin in the morning, at noon, and at bedtime.   ipratropium (ATROVENT) 0.06 % nasal spray Place 2 sprays into both nostrils 4 (four) times daily.   ipratropium-albuterol (DUONEB) 0.5-2.5 (3) MG/3ML SOLN Take 3 mLs by nebulization 4 (four) times daily as needed.   losartan (COZAAR) 50 MG tablet Take 25 mg by mouth daily.   melatonin 5 MG TABS Take 5 mg by mouth at bedtime.   metFORMIN (GLUCOPHAGE) 500 MG tablet Take 500 mg by mouth 2 (two) times daily.   montelukast (SINGULAIR) 10 MG tablet Take 10 mg by mouth at bedtime.    senna-docusate (SENOKOT-S) 8.6-50 MG tablet Take 1 tablet by mouth 2 (two) times daily between meals as needed for mild constipation.   traZODone (DESYREL) 50 MG tablet Take 1 tablet (50 mg total) by mouth at bedtime as needed for sleep.   vitamin B-12 (CYANOCOBALAMIN) 500 MCG tablet Take 500 mcg by mouth daily.   VITAMIN D, CHOLECALCIFEROL, PO Take 1 tablet by mouth daily.   No facility-administered encounter medications on file as of 04/27/2021.   Thank you for the opportunity to participate in the care of Ms. Clermont.  The palliative care team will continue to follow. Please call our office at [phone] if we can be of additional assistance.   Jason Coop, NP , DNP, AGPCNP-BC  COVID-19 PATIENT SCREENING TOOL Asked and negative response unless otherwise noted:  Have you had symptoms of covid, tested positive or been in contact with someone with symptoms/positive test in the past 5-10 days?

## 2021-04-29 ENCOUNTER — Telehealth: Payer: Self-pay | Admitting: Primary Care

## 2021-04-29 NOTE — Telephone Encounter (Signed)
Faxed PCS application to [facility].

## 2021-05-02 ENCOUNTER — Encounter: Payer: Self-pay | Admitting: Primary Care

## 2021-05-02 DIAGNOSIS — Z9181 History of falling: Secondary | ICD-10-CM | POA: Insufficient documentation

## 2021-05-04 ENCOUNTER — Other Ambulatory Visit (HOSPITAL_COMMUNITY): Payer: Self-pay

## 2021-05-04 NOTE — Progress Notes (Signed)
Today received a phone call that they were almost out of depends.  Took some by to her and they are very thankful.  They advised liked Palliative Care and waiting on someone from [facility] service to contact them.  They are doing ok and she states feels good.  Will continue to visit for heart failure.  ? ?Keshayla Schrum ?[place] EMT-Paramedic ?[phone]  ?

## 2021-05-13 ENCOUNTER — Ambulatory Visit (INDEPENDENT_AMBULATORY_CARE_PROVIDER_SITE_OTHER): Payer: Medicare Other | Admitting: Cardiovascular Disease

## 2021-05-13 ENCOUNTER — Encounter: Payer: Self-pay | Admitting: Cardiovascular Disease

## 2021-05-13 ENCOUNTER — Other Ambulatory Visit: Payer: Self-pay

## 2021-05-13 VITALS — BP 98/50 | HR 87 | Ht 63.0 in | Wt 218.0 lb

## 2021-05-13 DIAGNOSIS — I482 Chronic atrial fibrillation, unspecified: Secondary | ICD-10-CM

## 2021-05-13 DIAGNOSIS — E1165 Type 2 diabetes mellitus with hyperglycemia: Secondary | ICD-10-CM

## 2021-05-13 DIAGNOSIS — J449 Chronic obstructive pulmonary disease, unspecified: Secondary | ICD-10-CM

## 2021-05-13 DIAGNOSIS — R001 Bradycardia, unspecified: Secondary | ICD-10-CM

## 2021-05-13 DIAGNOSIS — I5032 Chronic diastolic (congestive) heart failure: Secondary | ICD-10-CM

## 2021-05-13 DIAGNOSIS — J9612 Chronic respiratory failure with hypercapnia: Secondary | ICD-10-CM

## 2021-05-13 DIAGNOSIS — I5031 Acute diastolic (congestive) heart failure: Secondary | ICD-10-CM | POA: Diagnosis not present

## 2021-05-13 DIAGNOSIS — Z794 Long term (current) use of insulin: Secondary | ICD-10-CM

## 2021-05-13 DIAGNOSIS — Z79899 Other long term (current) drug therapy: Secondary | ICD-10-CM

## 2021-05-13 DIAGNOSIS — I10 Essential (primary) hypertension: Secondary | ICD-10-CM

## 2021-05-13 DIAGNOSIS — I952 Hypotension due to drugs: Secondary | ICD-10-CM

## 2021-05-13 MED ORDER — FUROSEMIDE 20 MG PO TABS: 40.0000 mg | ORAL_TABLET | Freq: Two times a day (BID) | ORAL | 3 refills | Status: DC

## 2021-05-13 NOTE — Patient Instructions (Addendum)
Medication Instructions:  ?Increase lasix up to 40 mg twice a day ? ?If you need a refill on your cardiac medications before your next appointment, please call your pharmacy.  ? ?Lab work: ?BMP today ? ?Testing/Procedures: ?No new testing needed ? ?Follow-Up: ?At [facility], you and your health needs are our priority.  As part of our continuing mission to provide you with exceptional heart care, we have created designated Provider Care Teams.  These Care Teams include your primary Cardiologist (physician) and Advanced Practice Providers (APPs -  Physician Assistants and Nurse Practitioners) who all work together to provide you with the care you need, when you need it. ? ?You will need a follow up appointment in 6 months ? ?Providers on your designated Care Team:   ?Murray Hodgkins, NP ?Christell Faith, PA-C ?Cadence Kathlen Mody, PA-C ? ?COVID-19 Vaccine Information can be found at: [email or web address] For questions related to vaccine distribution or appointments, please email [email or web address] or call [phone].  ? ?

## 2021-05-13 NOTE — Progress Notes (Signed)
Date:  05/13/2021   ID:  Karen [name], DOB Feb 12, 1946, MRN [id number]  Patient Location:  [place] [id number]   Provider location:   [facility], [place] office  PCP:  Earlie Counts, FNP  Cardiologist:  Arvid Right [facility]   Chief Complaint  Patient presents with   6 month follow up     "Doing well." Medications reviewed by the patient verbally.     History of Present Illness:    Karen [name] is a 76 y.o. 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 She presents for routine follow-up of her atrial fibrillation and heart failure  Last office visit 3/22 In hospital 12/22: CHF Right pleural effusion status postthoracentesis on 02/07/2021 with 600 mL removed.  Has scale at home:  Weight 214 at home  HGB 9.4 On iron pill CR 1.1, BUN 44 A1C 9.8  EKG personally reviewed by myself on todays visit  Echo 12/22  1. Left ventricular ejection fraction, by estimation, is 60 to 65%. The  left ventricle has normal function. The left ventricle has no regional  wall motion abnormalities. Left ventricular diastolic parameters are  consistent with Grade I diastolic  dysfunction (impaired relaxation).   2. Right ventricular systolic function is mildly reduced. The right  ventricular size is mildly enlarged. There is severely elevated pulmonary  artery systolic pressure. The estimated right ventricular systolic  pressure is 08.6 mmHg.   3. The mitral valve is normal in structure. Mild to moderate mitral valve  regurgitation. No evidence of mitral stenosis.   4. Tricuspid valve regurgitation is moderate to severe.   5. The aortic valve is normal in structure.  Aortic valve regurgitation is  not visualized. Mild aortic valve stenosis. Aortic valve mean gradient  measures 17.0 mmHg. Aortic valve Vmax measures 2.71 m/s.   6. The inferior vena cava is dilated in size with >50% respiratory  variability, suggesting right atrial pressure of 8 mmHg.   In the hospital March 2022 for sepsis Urinary tract infection, was hypotensive, sodium 129 potassium of 3 creatinine 1.84  Treated with broad-spectrum antibiotics, IV fluids In the hospital 06/2019,  Hyponatremia: Na 127, up to 133 with fluids Rectal bleeding: suspected this is diverticular bleed. Hgb 10.9 --> 10.0 -->9.3--8.7.  Colonoscopy 08/2017 - Diverticulosis in the descending colon, in the transverse colon and in the ascending colon. A1c 8.4,   Junctional bradycardia on arrival to the hospital with hypotension Markedly elevated right heart pressures on echocardiogram,severe pulmonary HTN,  BNP was 800  Treated with Lasix IV twice daily Poor renal function on arrival, suspected cardiorenal syndrome Improvement of renal function with diuresis Improvement of her abdominal bloating, cough improved, leg swelling improved    Past Medical History:  Diagnosis Date   (HFpEF) heart failure with preserved ejection fraction ([place])    a. 2017 Echo: EF 50%; b. 06/2018 Echo: EF 50-55%; c. 08/2018 Echo: EF 50-55%, Nl RV fxn; d. 09/2019 Echo: EF 55-60%, no rwma, mild LVH, Gr1 DD, nl RV size/fxn, PASP 63.23mmHg. Mildly dil LA. Triv MR. Mod AS (AoV 0.94cm^2 VTI; mean grad 17.82mmHg).   Acute on chronic respiratory failure with hypoxia and hypercapnia (HCC) 01/07/2015   Anemia  Asterixis 01/07/2015   Asthma    Cataract    CKD (chronic kidney disease), stage III (HCC)    COPD (chronic obstructive pulmonary disease) ([place])    a. 06/2018 tobacco use, home 3L oxygen    Diabetes mellitus without complication ([place])    a. 09/2019 A1C 8.8   Edema, peripheral 04/20/2014   GI bleed 06/28/2019   History of kidney stones     Hyperlipidemia    Hypertension    Iron deficiency anemia 06/22/2014   Junctional bradycardia    a. In setting of beta blocker therapy.   Leucocytosis 10/19/2015   Moderate aortic stenosis    a.  09/2019 Echo: Mod AS (AoV 0.94cm^2 VTI; mean grad 17.34mmHg).   Morbid obesity ([place])    Overactive bladder    Primary osteoarthritis of right knee 09/01/2016   Sciatica 01/07/2015   Past Surgical History:  Procedure Laterality Date   APPENDECTOMY     CESAREAN SECTION     x3   CHOLECYSTECTOMY     COLONOSCOPY WITH PROPOFOL N/A 08/28/2017   Procedure: COLONOSCOPY WITH PROPOFOL;  Surgeon: Lucilla Lame, MD;  Location: [facility] ENDOSCOPY;  Service: Endoscopy;  Laterality: N/A;   COLONOSCOPY WITH PROPOFOL N/A 08/29/2017   Procedure: COLONOSCOPY WITH PROPOFOL;  Surgeon: Lucilla Lame, MD;  Location: [facility] ENDOSCOPY;  Service: Endoscopy;  Laterality: N/A;   CYSTOSCOPY W/ URETERAL STENT PLACEMENT Right 09/15/2017   Procedure: CYSTOSCOPY WITH RETROGRADE PYELOGRAM/URETERAL STENT PLACEMENT;  Surgeon: Cleon Gustin, MD;  Location: [facility] ORS;  Service: Urology;  Laterality: Right;   CYSTOSCOPY/URETEROSCOPY/HOLMIUM LASER/STENT PLACEMENT Right 10/09/2017   Procedure: CYSTOSCOPY/URETEROSCOPY/HOLMIUM LASER/STENT PLACEMENT;  Surgeon: Abbie Sons, MD;  Location: [facility] ORS;  Service: Urology;  Laterality: Right;  right Stent exchange   ESOPHAGOGASTRODUODENOSCOPY (EGD) WITH PROPOFOL N/A 09/16/2018   Procedure: ESOPHAGOGASTRODUODENOSCOPY (EGD) WITH PROPOFOL;  Surgeon: Lin Landsman, MD;  Location: [place];  Service: Gastroenterology;  Laterality: N/A;   EYE SURGERY     IR THORACENTESIS ASP PLEURAL SPACE W/IMG GUIDE  02/07/2021     Current Meds  Medication Sig   acetaminophen (TYLENOL) 500 MG tablet Take 1-2 tablets (500-1,000 mg total) by mouth every 6 (six) hours as needed for mild pain, fever, moderate pain or headache. Do not take more than 4 grams a day   albuterol (VENTOLIN HFA) 108 (90 Base) MCG/ACT  inhaler Inhale 2 puffs into the lungs every 6 (six) hours as needed for wheezing or shortness of breath.    allopurinol (ZYLOPRIM) 100 MG tablet Take 1 tablet by mouth daily.   apixaban (ELIQUIS) 5 MG TABS tablet Take 1 tablet by mouth twice daily   atorvastatin (LIPITOR) 10 MG tablet Take 1 tablet by mouth daily.   Dulaglutide 3 MG/0.5ML SOPN Inject 0.5 mLs into the skin once a week.   esomeprazole (NEXIUM) 40 MG capsule Take 40 mg by mouth daily.   Ferrous Sulfate (IRON) 325 (65 Fe) MG TABS Take 1 tablet (325 mg total) by mouth daily.   fluticasone (FLONASE) 50 MCG/ACT nasal spray Place 2 sprays into both nostrils daily.   Fluticasone-Umeclidin-Vilant 100-62.5-25 MCG/INH AEPB Inhale 1 puff into the lungs daily.   furosemide (LASIX) 20 MG tablet 2 tabs po in morning and 1 tab po in afternoon (may take extra tab in afternoon if gains 2 lbs in a day)   hydrochlorothiazide (HYDRODIURIL) 25 MG tablet Take 25 mg by mouth daily.   insulin glargine (LANTUS SOLOSTAR) 100 UNIT/ML Solostar Pen Inject 32 Units into the skin daily.  insulin lispro (HUMALOG) 100 UNIT/ML KwikPen Inject 10 Units into the skin in the morning, at noon, and at bedtime.   ipratropium (ATROVENT) 0.06 % nasal spray Place 2 sprays into both nostrils 4 (four) times daily.   ipratropium-albuterol (DUONEB) 0.5-2.5 (3) MG/3ML SOLN Take 3 mLs by nebulization 4 (four) times daily as needed.   losartan (COZAAR) 50 MG tablet Take 25 mg by mouth daily.   melatonin 5 MG TABS Take 5 mg by mouth at bedtime.   metFORMIN (GLUCOPHAGE) 500 MG tablet Take 500 mg by mouth 2 (two) times daily.   montelukast (SINGULAIR) 10 MG tablet Take 10 mg by mouth at bedtime.   senna-docusate (SENOKOT-S) 8.6-50 MG tablet Take 1 tablet by mouth 2 (two) times daily between meals as needed for mild constipation.   traZODone (DESYREL) 50 MG tablet Take 1 tablet (50 mg total) by mouth at bedtime as needed for sleep.   vitamin B-12 (CYANOCOBALAMIN) 500 MCG tablet Take  500 mcg by mouth daily.   VITAMIN D, CHOLECALCIFEROL, PO Take 1 tablet by mouth daily.     Allergies:   Ace inhibitors, Beta adrenergic blockers, Gabapentin, Lisinopril, Lyrica [pregabalin], and Shrimp [shellfish allergy]   Social History   Tobacco Use   Smoking status: Former    Packs/day: 1.00    Years: 20.00    Pack years: 20.00    Types: Cigarettes    Quit date: 12/04/1992    Years since quitting: 28.4   Smokeless tobacco: Never  Vaping Use   Vaping Use: Never used  Substance Use Topics   Alcohol use: No   Drug use: No      Family Hx: The patient's family history includes Other in her father and mother.  ROS:   Please see the history of present illness.    Review of Systems  Constitutional: Negative.   HENT: Negative.    Respiratory:  Positive for shortness of breath.   Cardiovascular:  Positive for leg swelling.  Gastrointestinal: Negative.   Musculoskeletal: Negative.   Neurological: Negative.   Psychiatric/Behavioral: Negative.    All other systems reviewed and are negative.    Labs/Other Tests and Data Reviewed:    Recent Labs: 02/02/2021: TSH 3.489 02/06/2021: ALT 9 02/07/2021: B Natriuretic Peptide 231.6 02/09/2021: Magnesium 1.8 02/12/2021: BUN 40; Creatinine, Ser 1.10; Hemoglobin 9.4; Platelets 237; Potassium 3.9; Sodium 137   Recent Lipid Panel No results found for: CHOL, TRIG, HDL, CHOLHDL, LDLCALC, LDLDIRECT  Wt Readings from Last 3 Encounters:  05/13/21 218 lb (98.9 kg)  04/21/21 213 lb (96.6 kg)  03/10/21 213 lb (96.6 kg)     Exam:    BP (!) 98/50 (BP Location: Left Arm, Patient Position: Sitting, Cuff Size: Large)    Pulse 87    Ht 5\' 3"  (1.6 m)    Wt 218 lb (98.9 kg)    SpO2 98% Comment: oxygen 3 Liters   BMI 38.62 kg/m   Constitutional:  oriented to person, place, and time. No distress.  Obese, presenting in a wheelchair HENT:  Head: Grossly normal Eyes:  no discharge. No scleral icterus.  Neck: No JVD, no carotid bruits   Cardiovascular: Regular rate and rhythm, no murmurs appreciated Trace lower extremity edema Pulmonary/Chest: Clear to auscultation bilaterally, no wheezes or rales Abdominal: Soft.  no distension.  no tenderness.  Musculoskeletal: Normal range of motion Neurological:  normal muscle tone. Coordination normal. No atrophy Skin: Skin warm and dry Psychiatric: normal affect, pleasant   ASSESSMENT & PLAN:  Chronic diastolic heart failure (HCC) - Hospitalized 3 months ago for CHF exacerbation, COPD exacerbation Weight at goal 214,  Weight up 4 pounds Increase Lasix up to 40 twice daily, continue HCTZ BMP today  Essential hypertension - Blood pressure low Denies orthostasis symptoms  Type 2 diabetes mellitus without complication, with long-term current use of insulin ([place])  Long history of poor control diabetes, Long discussion with need to calorie restrict, work closely with endocrine History of morbid obesity  Junctional bradycardia - All beta-blockers, calcium channel blockers held, Maintaining normal sinus rhythm   Total encounter time more than 30 minutes  Greater than 50% was spent in counseling and coordination of care with the patient     Signed, Karen Rogue, MD  05/13/2021 3:14 PM    [place] [id number], [place], [place] [address]

## 2021-05-14 LAB — BASIC METABOLIC PANEL
BUN/Creatinine Ratio: 30 — ABNORMAL HIGH (ref 12–28)
BUN: 29 mg/dL — ABNORMAL HIGH (ref 8–27)
CO2: 31 mmol/L — ABNORMAL HIGH (ref 20–29)
Calcium: 9.8 mg/dL (ref 8.7–10.3)
Chloride: 86 mmol/L — ABNORMAL LOW (ref 96–106)
Creatinine, Ser: 0.98 mg/dL (ref 0.57–1.00)
Glucose: 258 mg/dL — ABNORMAL HIGH (ref 70–99)
Potassium: 4 mmol/L (ref 3.5–5.2)
Sodium: 135 mmol/L (ref 134–144)
eGFR: 59 mL/min/{1.73_m2} — ABNORMAL LOW (ref >59)

## 2021-05-16 NOTE — Progress Notes (Signed)
Karen [name] [name] Karen [name] [name], female    DOB: 06-24-1945, 76 y.o.   MRN: [id number]  [name] Karen [name] [name] is a 76 y/o female with a history of HTN, CKD, DM, asthma, hyperlipidemia, COPD, anemia and chronic heart failure.    Echo report from 02/06/21 reviewed and showed an EF of 60-65%, no LVR. RV mildly enlarged, severely elevated PA systolic pressure. Echo report from 02/14/2019 reviewed and showed an EF of 65-70% without LVH along with trivial MR/TR and moderately elevated PA pressure.   Admitted 02/01/21 11 days for HF exacerbation, COPD exacerbation and encephalopathy. Discharged to SNF.   Karen [name] Karen [name] [name] presents today for a follow-up visit with a chief complaint of moderate fatigue upon minimal exertion, and SOB at rest. Karen [name] Karen [name] [name] describes this as chronic. Karen [name] Karen [name] [name] has associated pedal edema and fluctuating weight gain along with this. Karen [name] Karen [name] [name] denies any CP, difficulty sleeping, dizziness, abdominal distention, palpitations, or cough.   Weighs her self daily, but unable to weigh in clinic. Her weight at home was 215 today. Karen [name] Karen [name] [name] checks her BP daily but cannot recall what it normally runs.   Saw cardiologist Rockey Situ) 05/13/21, lasix was increased to 40 BID. Her main complaint today is that Karen [name] Karen [name] [name] is "peeing all Karen [name] time".   Past Medical History:  Diagnosis Date   (HFpEF) heart failure with preserved ejection fraction ([place])    a. 2017 Echo: EF 50%; b. 06/2018 Echo: EF 50-55%; c. 08/2018 Echo: EF 50-55%, Nl RV fxn; d. 09/2019 Echo: EF 55-60%, no rwma, mild LVH, Gr1 DD, nl RV size/fxn, PASP 63.62mmHg. Mildly dil LA. Triv MR. Mod AS (AoV 0.94cm^2 VTI; mean grad 17.36mmHg).   Acute on chronic respiratory failure with hypoxia and hypercapnia (HCC) 01/07/2015   Anemia    Asterixis 01/07/2015   Asthma    Cataract    CKD (chronic kidney disease), stage III (HCC)    COPD (chronic obstructive pulmonary disease) ([place])    a. 06/2018 tobacco use, home 3L oxygen    Diabetes mellitus without complication ([place])    a. 09/2019 A1C 8.8   Edema,  peripheral 04/20/2014   GI bleed 06/28/2019   History of kidney stones    Hyperlipidemia    Hypertension    Iron deficiency anemia 06/22/2014   Junctional bradycardia    a. In setting of beta blocker therapy.   Leucocytosis 10/19/2015   Moderate aortic stenosis    a.  09/2019 Echo: Mod AS (AoV 0.94cm^2 VTI; mean grad 17.17mmHg).   Morbid obesity ([place])    Overactive bladder    Primary osteoarthritis of right knee 09/01/2016   Sciatica 01/07/2015   Past Surgical History:  Procedure Laterality Date   APPENDECTOMY     CESAREAN SECTION     x3   CHOLECYSTECTOMY     COLONOSCOPY WITH PROPOFOL N/A 08/28/2017   Procedure: COLONOSCOPY WITH PROPOFOL;  Surgeon: Lucilla Lame, MD;  Location: [facility] ENDOSCOPY;  Service: Endoscopy;  Laterality: N/A;   COLONOSCOPY WITH PROPOFOL N/A 08/29/2017   Procedure: COLONOSCOPY WITH PROPOFOL;  Surgeon: Lucilla Lame, MD;  Location: [facility] ENDOSCOPY;  Service: Endoscopy;  Laterality: N/A;   CYSTOSCOPY W/ URETERAL STENT PLACEMENT Right 09/15/2017   Procedure: CYSTOSCOPY WITH RETROGRADE PYELOGRAM/URETERAL STENT PLACEMENT;  Surgeon: Cleon Gustin, MD;  Location: [facility] ORS;  Service: Urology;  Laterality: Right;   CYSTOSCOPY/URETEROSCOPY/HOLMIUM LASER/STENT PLACEMENT Right 10/09/2017   Procedure: CYSTOSCOPY/URETEROSCOPY/HOLMIUM LASER/STENT PLACEMENT;  Surgeon: Abbie Sons, MD;  Location: [facility] ORS;  Service: Urology;  Laterality: Right;  right Stent exchange   ESOPHAGOGASTRODUODENOSCOPY (EGD) WITH PROPOFOL  N/A 09/16/2018   Procedure: ESOPHAGOGASTRODUODENOSCOPY (EGD) WITH PROPOFOL;  Surgeon: Lin Landsman, MD;  Location: [facility] ENDOSCOPY;  Service: Gastroenterology;  Laterality: N/A;   EYE SURGERY     IR THORACENTESIS ASP PLEURAL SPACE W/IMG GUIDE  02/07/2021   Family History  Problem Relation Age of Onset   Other Mother        unknown medical history   Other Father        unknown medical history   Social History   Tobacco Use   Smoking status: Former     Packs/day: 1.00    Years: 20.00    Pack years: 20.00    Types: Cigarettes    Quit date: 12/04/1992    Years since quitting: 28.4   Smokeless tobacco: Never  Substance Use Topics   Alcohol use: No   Allergies  Allergen Reactions   Ace Inhibitors Hives   Beta Adrenergic Blockers     Junctional bradycardia   Gabapentin Hives   Lisinopril Hives   Lyrica [Pregabalin] Hives   Shrimp [Shellfish Allergy] Swelling    Swelling of Karen [name] lips   Prior to Admission medications   Medication Sig Start Date End Date Taking? Authorizing Provider  albuterol (VENTOLIN HFA) 108 (90 Base) MCG/ACT inhaler INHALE 2 PUFFS BY MOUTH EVERY 6 HOURS AS NEEDED FOR WHEEZING 01/02/19  Yes [provider]  apixaban (ELIQUIS) 5 MG TABS tablet Take 1 tablet (5 mg total) by mouth 2 (two) times daily. 04/09/19  Yes [name], MD  atorvastatin (LIPITOR) 10 MG tablet Take 1 tablet by mouth daily.   Yes [provider]  esomeprazole (NEXIUM) 40 MG capsule Take 40 mg by mouth daily. 12/17/18  Yes [provider]  Ferrous Sulfate (IRON) 325 (65 Fe) MG TABS Take 1 tablet by mouth daily. 09/14/17  Yes [provider]  fluticasone (FLONASE) 50 MCG/ACT nasal spray Place 2 sprays into both nostrils daily. 11/15/18  Yes [provider]  Fluticasone-Umeclidin-Vilant 100-62.5-25 MCG/INH AEPB Inhale 1 puff into Karen [name] lungs daily. 12/25/17  Yes [provider]  furosemide (LASIX) 20 MG tablet Take 2 tablets (40 mg total) by mouth as directed. Take 2 tablets (40 mg) in Karen [name] AM and extra 1-2 tablets (20-40 mg) in Karen [name] afternoon for leg swelling or shortness of breath 07/07/19 10/05/19 Yes Gollan, Kathlene November, MD  Insulin Human (INSULIN PUMP) SOLN Inject 1 each into Karen [name] skin 3 times daily with meals, bedtime and 2 AM. Karen [name] [name] taking differently: Inject 1 each into Karen [name] skin 3 times daily with meals, bedtime and 2 AM. Humalog Insulin 0-76 units daily insulin pump 09/18/17  Yes Dustin Flock, MD   ipratropium-albuterol (DUONEB) 0.5-2.5 (3) MG/3ML SOLN Inhale 3 mLs into Karen [name] lungs 4 (four) times daily. 02/05/19  Yes [provider]  losartan-hydrochlorothiazide (HYZAAR) 100-25 MG tablet Take 1 tablet by mouth daily. 01/05/19  Yes [provider]  metFORMIN (GLUCOPHAGE) 500 MG tablet Take 1,000 mg by mouth 2 (two) times daily with a meal.    Yes [provider]  montelukast (SINGULAIR) 10 MG tablet Take 10 mg by mouth at bedtime.   Yes [provider]  potassium chloride (KLOR-CON) 10 MEQ tablet Take 1 tablet (10 mEq total) by mouth as directed. Take one tablet daily and when you take extra furosemide take extra tablet 07/07/19  Yes Gollan, Kathlene November, MD  vitamin B-12 (CYANOCOBALAMIN) 500 MCG tablet Take 500 mcg by mouth daily.   Yes [provider]    Review  of Systems  Constitutional:  Positive for fatigue and unexpected weight change. Negative for appetite change.  HENT:  Negative for congestion, postnasal drip and sore throat.   Eyes: Negative.   Respiratory:  Positive for cough and shortness of breath.   Cardiovascular:  Positive for leg swelling. Negative for chest pain and palpitations.  Gastrointestinal:  Negative for abdominal distention and abdominal pain.  Endocrine: Negative.   Genitourinary: Negative.  Negative for difficulty urinating, dyspareunia, dysuria, flank pain, frequency, hematuria and urgency.  Musculoskeletal:  Positive for arthralgias. Negative for back pain and neck pain.  Allergic/Immunologic: Negative.   Neurological:  Negative for dizziness, syncope, weakness and light-headedness.  Hematological:  Negative for adenopathy. Does not bruise/bleed easily.  Psychiatric/Behavioral:  Negative for dysphoric mood and sleep disturbance (sleeping on 2 pillows). Karen [name] Karen [name] [name] is not nervous/anxious.    Vitals:   05/17/21 1329 05/17/21 1334  BP:  (!) 134/59  Pulse:  81  Resp:  16  SpO2:  96%  Weight: 218 lb (98.9 kg)   Height:  5\' 3"  (1.6 m)    Wt Readings from Last 3 Encounters:  05/17/21 218 lb (98.9 kg)  05/13/21 218 lb (98.9 kg)  04/21/21 213 lb (96.6 kg)    Lab Results  Component Value Date   CREATININE 0.98 05/13/2021   CREATININE 1.10 (H) 02/12/2021   CREATININE 1.11 (H) 02/11/2021    Physical Exam Vitals and nursing note reviewed. Exam conducted with a chaperone present.  Constitutional:      General: Karen [name] Karen [name] [name] is not in acute distress.    Appearance: Normal appearance. Karen [name] Karen [name] [name] is ill-appearing. Karen [name] Karen [name] [name] is not toxic-appearing.  HENT:     Head: Normocephalic and atraumatic.  Cardiovascular:     Rate and Rhythm: Normal rate and regular rhythm.     Pulses: Normal pulses.     Heart sounds: No murmur heard. Pulmonary:     Effort: Pulmonary effort is normal. No respiratory distress.     Breath sounds: Rales present. No wheezing.  Abdominal:     General: There is no distension.     Palpations: Abdomen is soft.     Tenderness: There is no abdominal tenderness.  Musculoskeletal:        General: No tenderness.     Cervical back: Normal range of motion.     Right lower leg: Edema (2+ pitting) present.     Left lower leg: Edema (2+ pitting) present.  Skin:    General: Skin is warm and dry.  Neurological:     General: No focal deficit present.     Mental Status: Karen [name] Karen [name] [name] is alert and oriented to person, place, and time.  Psychiatric:        Mood and Affect: Mood normal.        Behavior: Behavior normal.    Assessment & Plan:  1: Acute on Chronic heart failure with preserved ejection fraction without structural changes- - NYHA class III - volume overloaded today  - weighing daily; Karen [name] Karen [name] [name] reports her baseline weight is 215 - weight 218 in office, up 5 lbs from last visit  - saw cardiology (Gollan) 05/13/21, BP 98/50, lasix changed to 40 BID, Karen [name] Karen [name] [name] advise Karen [name] Karen [name] [name] cannot tolerate this change - add metolazone 2.5 mg PO x 2 days - Karen [name] Karen [name] [name] was taking her second dose of lasix @ 6 pm, instructed to take ~ 2 pm/6 hours after her  first dose - participating in paramedicine program; will see if they can see her in Karen [name] home Thursday to see if further  adjustments need to be made - return 05/23/21 for f/u visit, will check labs then - not adding salt to her food - BNP 02/07/21 was 234  2: Hypertension - BP 134/59; although recent recordings in office have been lower - sees PCP Vevelyn Royals) - BMP 05/13/21 reviewed and showed sodium 135, potassium 4.0, creatinine 0.98 & GFR 59  3: DM - saw endocrinology Honor Junes) 04/14/21 - A1c 02/03/21 was 9.8%  4: COPD - wearing oxygen at 3L around Karen [name] clock - palliative care visit on 04/27/21   Karen [name] [name] did not bring her medications nor a list. Each medication was verbally reviewed with Karen [name] Karen [name] [name] and Karen [name] Karen [name] [name] was encouraged to bring Karen [name] bottles to every visit to confirm accuracy of list.  Return on 05/23/21.

## 2021-05-17 ENCOUNTER — Ambulatory Visit: Payer: Medicare Other | Attending: Family | Admitting: Family

## 2021-05-17 ENCOUNTER — Encounter: Payer: Self-pay | Admitting: Family

## 2021-05-17 ENCOUNTER — Other Ambulatory Visit: Payer: Self-pay

## 2021-05-17 VITALS — BP 134/59 | HR 81 | Resp 16 | Ht 63.0 in | Wt 218.0 lb

## 2021-05-17 DIAGNOSIS — I5033 Acute on chronic diastolic (congestive) heart failure: Secondary | ICD-10-CM | POA: Diagnosis not present

## 2021-05-17 DIAGNOSIS — Z9981 Dependence on supplemental oxygen: Secondary | ICD-10-CM | POA: Insufficient documentation

## 2021-05-17 DIAGNOSIS — I130 Hypertensive heart and chronic kidney disease with heart failure and stage 1 through stage 4 chronic kidney disease, or unspecified chronic kidney disease: Secondary | ICD-10-CM | POA: Insufficient documentation

## 2021-05-17 DIAGNOSIS — I10 Essential (primary) hypertension: Secondary | ICD-10-CM

## 2021-05-17 DIAGNOSIS — E1165 Type 2 diabetes mellitus with hyperglycemia: Secondary | ICD-10-CM

## 2021-05-17 DIAGNOSIS — E785 Hyperlipidemia, unspecified: Secondary | ICD-10-CM | POA: Insufficient documentation

## 2021-05-17 DIAGNOSIS — N183 Chronic kidney disease, stage 3 unspecified: Secondary | ICD-10-CM | POA: Diagnosis not present

## 2021-05-17 DIAGNOSIS — J449 Chronic obstructive pulmonary disease, unspecified: Secondary | ICD-10-CM | POA: Diagnosis not present

## 2021-05-17 DIAGNOSIS — D631 Anemia in chronic kidney disease: Secondary | ICD-10-CM | POA: Insufficient documentation

## 2021-05-17 DIAGNOSIS — E1122 Type 2 diabetes mellitus with diabetic chronic kidney disease: Secondary | ICD-10-CM | POA: Insufficient documentation

## 2021-05-17 DIAGNOSIS — Z79899 Other long term (current) drug therapy: Secondary | ICD-10-CM | POA: Insufficient documentation

## 2021-05-17 DIAGNOSIS — R5383 Other fatigue: Secondary | ICD-10-CM | POA: Diagnosis present

## 2021-05-17 DIAGNOSIS — R0602 Shortness of breath: Secondary | ICD-10-CM | POA: Diagnosis not present

## 2021-05-17 MED ORDER — METOLAZONE 2.5 MG PO TABS: 2.5000 mg | ORAL_TABLET | Freq: Every day | ORAL | 0 refills | Status: DC

## 2021-05-17 NOTE — Patient Instructions (Signed)
Pick up the new fluid pill (metolazone). ? ?You will take on Wednesday and Thursday am. Take it, wait 30 minutes and then take your morning dose of lasix. ? ?Take your second dose of lasix to every day @ 2 pm. ? ?Return on Monday, we will need to get blood work.  ?

## 2021-05-18 ENCOUNTER — Other Ambulatory Visit: Payer: Self-pay | Admitting: Cardiovascular Disease

## 2021-05-19 ENCOUNTER — Encounter (HOSPITAL_COMMUNITY): Payer: Self-pay

## 2021-05-19 ENCOUNTER — Other Ambulatory Visit (HOSPITAL_COMMUNITY): Payer: Self-pay

## 2021-05-19 NOTE — Progress Notes (Signed)
Today had a home visit to check on [place].  She had a visit with HF clinic this week and was prescribed metolazone for 2 days.  She states she has taken them and she did do them 30 minutes prior to her morning medications.  She states she is only taking 1 furosemide in the morning and 1 at 2.  Explained to her she should be taking 2 in am and 2 at around 2pm.  Confirmed with [name] at [facility] clinic and [place] states she will take 2 and 2 now.  She has HF clinic appt on Monday and she states she will be going.  She states feels good, edema is down in legs.  Abdomen is soft.  Lungs right clear and left has some rhonchi in lower.  She states breathing feels better.  Her sugar is elevated at 276 but she is due for her insulin.  Appetite is good.  She wears oxygen 24 hrs a day.  She has all her medications and aware of how to take them. She is not weighing, today while I was there she weighed.  Advised her to weigh daily and write it down.  She states she will.  Home health came and did their evaluation and should hear something in about a week or so.  Gave her some depends that are donated to me.  She denies any chest pain, headaches, dizziness or increased shortness of breath.  Will continue to visit for heart failure, diet and medication management.  ? ?Kassidi Elza ? EMT-Paramedic ?[phone] ?

## 2021-05-22 NOTE — Progress Notes (Signed)
Patient ID: Karen [name], female    DOB: 1945-12-09, 76 y.o.   MRN: [id number]  Ms Ficken is a 76 y/o female with a history of HTN, CKD, DM, asthma, hyperlipidemia, COPD, anemia and chronic heart failure.    Echo report from 02/06/21 reviewed and showed an EF of 60-65%, no LVR. RV mildly enlarged, severely elevated PA systolic pressure. Echo report from 02/14/2019 reviewed and showed an EF of 65-70% without LVH along with trivial MR/TR and moderately elevated PA pressure.   Admitted 02/01/21 11 days for HF exacerbation, COPD exacerbation and encephalopathy. Discharged to SNF.   She presents today for a follow-up visit with a chief complaint of moderate fatigue upon minimal exertion, and SOB at rest. She describes this as chronic. She has associated pedal edema and fluctuating weight gain along with this. She denies any CP, difficulty sleeping, dizziness, abdominal distention, palpitations, or cough.   Was given metolazone x 2 days, states she feels better.   Since her last visit, she has diuresed 5 lbs and states her SOB is at her baseline.  Past Medical History:  Diagnosis Date   (HFpEF) heart failure with preserved ejection fraction ([place])    a. 2017 Echo: EF 50%; b. 06/2018 Echo: EF 50-55%; c. 08/2018 Echo: EF 50-55%, Nl RV fxn; d. 09/2019 Echo: EF 55-60%, no rwma, mild LVH, Gr1 DD, nl RV size/fxn, PASP 63.51mmHg. Mildly dil LA. Triv MR. Mod AS (AoV 0.94cm^2 VTI; mean grad 17.61mmHg).   Acute on chronic respiratory failure with hypoxia and hypercapnia (HCC) 01/07/2015   Anemia    Asterixis 01/07/2015   Asthma    Cataract    CKD (chronic kidney disease), stage III (HCC)    COPD (chronic obstructive pulmonary disease) ([place])    a. 06/2018 tobacco use, home 3L oxygen    Diabetes mellitus without complication ([place])    a. 09/2019 A1C 8.8   Edema, peripheral 04/20/2014   GI bleed 06/28/2019   History of kidney stones    Hyperlipidemia    Hypertension    Iron deficiency anemia 06/22/2014    Junctional bradycardia    a. In setting of beta blocker therapy.   Leucocytosis 10/19/2015   Moderate aortic stenosis    a.  09/2019 Echo: Mod AS (AoV 0.94cm^2 VTI; mean grad 17.28mmHg).   Morbid obesity ([place])    Overactive bladder    Primary osteoarthritis of right knee 09/01/2016   Sciatica 01/07/2015   Past Surgical History:  Procedure Laterality Date   APPENDECTOMY     CESAREAN SECTION     x3   CHOLECYSTECTOMY     COLONOSCOPY WITH PROPOFOL N/A 08/28/2017   Procedure: COLONOSCOPY WITH PROPOFOL;  Surgeon: Lucilla Lame, MD;  Location: [facility] ENDOSCOPY;  Service: Endoscopy;  Laterality: N/A;   COLONOSCOPY WITH PROPOFOL N/A 08/29/2017   Procedure: COLONOSCOPY WITH PROPOFOL;  Surgeon: Lucilla Lame, MD;  Location: [facility] ENDOSCOPY;  Service: Endoscopy;  Laterality: N/A;   CYSTOSCOPY W/ URETERAL STENT PLACEMENT Right 09/15/2017   Procedure: CYSTOSCOPY WITH RETROGRADE PYELOGRAM/URETERAL STENT PLACEMENT;  Surgeon: Cleon Gustin, MD;  Location: [facility] ORS;  Service: Urology;  Laterality: Right;   CYSTOSCOPY/URETEROSCOPY/HOLMIUM LASER/STENT PLACEMENT Right 10/09/2017   Procedure: CYSTOSCOPY/URETEROSCOPY/HOLMIUM LASER/STENT PLACEMENT;  Surgeon: Abbie Sons, MD;  Location: [facility] ORS;  Service: Urology;  Laterality: Right;  right Stent exchange   ESOPHAGOGASTRODUODENOSCOPY (EGD) WITH PROPOFOL N/A 09/16/2018   Procedure: ESOPHAGOGASTRODUODENOSCOPY (EGD) WITH PROPOFOL;  Surgeon: Lin Landsman, MD;  Location: [place];  Service: Gastroenterology;  Laterality: N/A;  EYE SURGERY     IR THORACENTESIS ASP PLEURAL SPACE W/IMG GUIDE  02/07/2021   Family History  Problem Relation Age of Onset   Other Mother        unknown medical history   Other Father        unknown medical history   Social History   Tobacco Use   Smoking status: Former    Packs/day: 1.00    Years: 20.00    Pack years: 20.00    Types: Cigarettes    Quit date: 12/04/1992    Years since quitting: 28.4   Smokeless tobacco:  Never  Substance Use Topics   Alcohol use: No   Allergies  Allergen Reactions   Ace Inhibitors Hives   Beta Adrenergic Blockers     Junctional bradycardia   Gabapentin Hives   Lisinopril Hives   Lyrica [Pregabalin] Hives   Shrimp [Shellfish Allergy] Swelling    Swelling of the lips   Prior to Admission medications   Medication Sig Start Date End Date Taking? Authorizing Provider  albuterol (VENTOLIN HFA) 108 (90 Base) MCG/ACT inhaler INHALE 2 PUFFS BY MOUTH EVERY 6 HOURS AS NEEDED FOR WHEEZING 01/02/19  Yes [provider]  apixaban (ELIQUIS) 5 MG TABS tablet Take 1 tablet (5 mg total) by mouth 2 (two) times daily. 04/09/19  Yes [name], MD  atorvastatin (LIPITOR) 10 MG tablet Take 1 tablet by mouth daily.   Yes [provider]  esomeprazole (NEXIUM) 40 MG capsule Take 40 mg by mouth daily. 12/17/18  Yes [provider]  Ferrous Sulfate (IRON) 325 (65 Fe) MG TABS Take 1 tablet by mouth daily. 09/14/17  Yes [provider]  fluticasone (FLONASE) 50 MCG/ACT nasal spray Place 2 sprays into both nostrils daily. 11/15/18  Yes [provider]  Fluticasone-Umeclidin-Vilant 100-62.5-25 MCG/INH AEPB Inhale 1 puff into the lungs daily. 12/25/17  Yes [provider]  furosemide (LASIX) 20 MG tablet Take 2 tablets (40 mg total) by mouth as directed. Take 2 tablets (40 mg) in the AM and extra 1-2 tablets (20-40 mg) in the afternoon for leg swelling or shortness of breath 07/07/19 10/05/19 Yes Gollan, Kathlene November, MD  Insulin Human (INSULIN PUMP) SOLN Inject 1 each into the skin 3 times daily with meals, bedtime and 2 AM. Patient taking differently: Inject 1 each into the skin 3 times daily with meals, bedtime and 2 AM. Humalog Insulin 0-76 units daily insulin pump 09/18/17  Yes Dustin Flock, MD  ipratropium-albuterol (DUONEB) 0.5-2.5 (3) MG/3ML SOLN Inhale 3 mLs into the lungs 4 (four) times daily. 02/05/19  Yes [provider]   losartan-hydrochlorothiazide (HYZAAR) 100-25 MG tablet Take 1 tablet by mouth daily. 01/05/19  Yes [provider]  metFORMIN (GLUCOPHAGE) 500 MG tablet Take 1,000 mg by mouth 2 (two) times daily with a meal.    Yes [provider]  montelukast (SINGULAIR) 10 MG tablet Take 10 mg by mouth at bedtime.   Yes [provider]  potassium chloride (KLOR-CON) 10 MEQ tablet Take 1 tablet (10 mEq total) by mouth as directed. Take one tablet daily and when you take extra furosemide take extra tablet 07/07/19  Yes Gollan, Kathlene November, MD  vitamin B-12 (CYANOCOBALAMIN) 500 MCG tablet Take 500 mcg by mouth daily.   Yes [provider]    Review of Systems  Constitutional:  Positive for fatigue. Negative for appetite change.  HENT:  Negative for congestion, postnasal drip and sore throat.   Eyes: Negative.  Respiratory:  Positive for cough and shortness of breath.   Cardiovascular:  Positive for leg swelling. Negative for chest pain and palpitations.  Gastrointestinal:  Negative for abdominal distention and abdominal pain.  Endocrine: Negative.   Genitourinary: Negative.  Negative for difficulty urinating, dyspareunia, dysuria, flank pain, frequency, hematuria and urgency.  Musculoskeletal:  Positive for arthralgias. Negative for back pain and neck pain.  Allergic/Immunologic: Negative.   Neurological:  Negative for dizziness, syncope, weakness and light-headedness.  Hematological:  Negative for adenopathy. Does not bruise/bleed easily.  Psychiatric/Behavioral:  Negative for dysphoric mood and sleep disturbance (sleeping on 2 pillows). The patient is not nervous/anxious.    Vitals:   05/23/21 1420  BP: (!) 144/61  Pulse: 79  Resp: 18  SpO2: 98%  Weight: 213 lb (96.6 kg)  Height: 5\' 3"  (1.6 m)   Wt Readings from Last 3 Encounters:  05/23/21 213 lb (96.6 kg)  05/19/21 208 lb 6.4 oz (94.5 kg)  05/17/21 218 lb (98.9 kg)   Lab Results  Component Value Date    CREATININE 0.98 05/13/2021   CREATININE 1.10 (H) 02/12/2021   CREATININE 1.11 (H) 02/11/2021    Physical Exam Vitals and nursing note reviewed. Exam conducted with a chaperone present.  Constitutional:      General: She is not in acute distress.    Appearance: Normal appearance. She is ill-appearing. She is not toxic-appearing.  HENT:     Head: Normocephalic and atraumatic.  Cardiovascular:     Rate and Rhythm: Normal rate and regular rhythm.     Pulses: Normal pulses.     Heart sounds: No murmur heard. Pulmonary:     Effort: Pulmonary effort is normal. No respiratory distress.     Breath sounds: Rales present. No wheezing.  Abdominal:     General: There is no distension.     Palpations: Abdomen is soft.     Tenderness: There is no abdominal tenderness.  Musculoskeletal:        General: No tenderness.     Cervical back: Normal range of motion.     Right lower leg: Edema (+2) present.     Left lower leg: Edema (+2) present.  Skin:    General: Skin is warm and dry.  Neurological:     General: No focal deficit present.     Mental Status: She is alert and oriented to person, place, and time.  Psychiatric:        Mood and Affect: Mood normal.        Behavior: Behavior normal.    Assessment & Plan:  1: Chronic heart failure with preserved ejection fraction without structural changes- - NYHA class II - appears to be back to her baseline - weighing daily - weight 213, down 5 lbs since last visit - saw cardiology Rockey Situ) 05/13/21 - participating in paramedicine program - check BMP today - not adding salt to her food - BNP 02/07/21 was 234  2: Hypertension - BP 144/61 although recent recordings in office have been lower and she presents a BP log from home 120-130's/80's - sees PCP Vevelyn Royals) - BMP 05/13/21 reviewed and showed sodium 135, potassium 4.0, creatinine 0.98 & GFR 59  3: DM - saw endocrinology Honor Junes) 04/14/21 - A1c 02/03/21 was 9.8%  4: COPD - wearing oxygen  at 3L around the clock - palliative care visit on 04/27/21   Patient did not bring her medications nor a list. Each medication was verbally reviewed with the patient and she was encouraged to  bring the bottles to every visit to confirm accuracy of list.  Return in 1 month or sooner if needed.

## 2021-05-23 ENCOUNTER — Encounter: Payer: Self-pay | Admitting: Family

## 2021-05-23 ENCOUNTER — Other Ambulatory Visit: Payer: Self-pay

## 2021-05-23 ENCOUNTER — Ambulatory Visit: Payer: Medicare Other | Attending: Family | Admitting: Family

## 2021-05-23 VITALS — BP 144/61 | HR 79 | Resp 18 | Ht 63.0 in | Wt 213.0 lb

## 2021-05-23 DIAGNOSIS — R0602 Shortness of breath: Secondary | ICD-10-CM | POA: Insufficient documentation

## 2021-05-23 DIAGNOSIS — R600 Localized edema: Secondary | ICD-10-CM | POA: Diagnosis not present

## 2021-05-23 DIAGNOSIS — E785 Hyperlipidemia, unspecified: Secondary | ICD-10-CM | POA: Diagnosis not present

## 2021-05-23 DIAGNOSIS — Z9981 Dependence on supplemental oxygen: Secondary | ICD-10-CM | POA: Insufficient documentation

## 2021-05-23 DIAGNOSIS — R5383 Other fatigue: Secondary | ICD-10-CM | POA: Diagnosis present

## 2021-05-23 DIAGNOSIS — I5032 Chronic diastolic (congestive) heart failure: Secondary | ICD-10-CM | POA: Diagnosis not present

## 2021-05-23 DIAGNOSIS — E1122 Type 2 diabetes mellitus with diabetic chronic kidney disease: Secondary | ICD-10-CM | POA: Insufficient documentation

## 2021-05-23 DIAGNOSIS — I130 Hypertensive heart and chronic kidney disease with heart failure and stage 1 through stage 4 chronic kidney disease, or unspecified chronic kidney disease: Secondary | ICD-10-CM | POA: Diagnosis not present

## 2021-05-23 DIAGNOSIS — Z794 Long term (current) use of insulin: Secondary | ICD-10-CM

## 2021-05-23 DIAGNOSIS — N183 Chronic kidney disease, stage 3 unspecified: Secondary | ICD-10-CM | POA: Diagnosis not present

## 2021-05-23 DIAGNOSIS — I10 Essential (primary) hypertension: Secondary | ICD-10-CM

## 2021-05-23 DIAGNOSIS — E1165 Type 2 diabetes mellitus with hyperglycemia: Secondary | ICD-10-CM

## 2021-05-23 DIAGNOSIS — J449 Chronic obstructive pulmonary disease, unspecified: Secondary | ICD-10-CM | POA: Diagnosis not present

## 2021-05-23 LAB — BASIC METABOLIC PANEL
Anion gap: 10 (ref 5–15)
BUN: 42 mg/dL — ABNORMAL HIGH (ref 8–23)
CO2: 33 mmol/L — ABNORMAL HIGH (ref 22–32)
Calcium: 9.4 mg/dL (ref 8.9–10.3)
Chloride: 91 mmol/L — ABNORMAL LOW (ref 98–111)
Creatinine, Ser: 1.04 mg/dL — ABNORMAL HIGH (ref 0.44–1.00)
GFR, Estimated: 55 mL/min — ABNORMAL LOW (ref >60)
Glucose, Bld: 164 mg/dL — ABNORMAL HIGH (ref 70–99)
Potassium: 3.5 mmol/L (ref 3.5–5.1)
Sodium: 134 mmol/L — ABNORMAL LOW (ref 135–145)

## 2021-05-23 NOTE — Patient Instructions (Signed)
Continue to weigh daily. ? ?If you gain > 2-3lbs/day or 5 lbs/week, CALL THE HEART FAILURE office. ? ?Continue to restrict your salt intake ~ 2 grams per day. ? ?Continue to drink no more fluids than ~ 64 ounces/day.  ? ?Return in 1 month.  ?

## 2021-05-26 ENCOUNTER — Other Ambulatory Visit: Payer: Self-pay | Admitting: Family

## 2021-05-28 ENCOUNTER — Ambulatory Visit
Admission: EM | Admit: 2021-05-28 | Discharge: 2021-05-28 | Disposition: A | Discharge status: Home / Self Care | Payer: Medicare Other | Attending: Emergency Medicine | Admitting: Emergency Medicine

## 2021-05-28 ENCOUNTER — Emergency Department: Payer: Medicare Other

## 2021-05-28 ENCOUNTER — Inpatient Hospital Stay
Admission: EM | Admit: 2021-05-28 | Discharge: 2021-06-02 | DRG: 291 | Disposition: A | Discharge status: Skilled Nursing Facility | Payer: Medicare Other | Attending: Internal Medicine | Admitting: Internal Medicine

## 2021-05-28 ENCOUNTER — Encounter: Payer: Self-pay | Admitting: Emergency Medicine

## 2021-05-28 ENCOUNTER — Other Ambulatory Visit: Payer: Self-pay

## 2021-05-28 DIAGNOSIS — Z20822 Contact with and (suspected) exposure to covid-19: Secondary | ICD-10-CM | POA: Diagnosis present

## 2021-05-28 DIAGNOSIS — E1122 Type 2 diabetes mellitus with diabetic chronic kidney disease: Secondary | ICD-10-CM | POA: Diagnosis present

## 2021-05-28 DIAGNOSIS — E876 Hypokalemia: Secondary | ICD-10-CM | POA: Diagnosis present

## 2021-05-28 DIAGNOSIS — I130 Hypertensive heart and chronic kidney disease with heart failure and stage 1 through stage 4 chronic kidney disease, or unspecified chronic kidney disease: Principal | ICD-10-CM | POA: Diagnosis present

## 2021-05-28 DIAGNOSIS — E785 Hyperlipidemia, unspecified: Secondary | ICD-10-CM | POA: Diagnosis present

## 2021-05-28 DIAGNOSIS — J9622 Acute and chronic respiratory failure with hypercapnia: Secondary | ICD-10-CM | POA: Diagnosis present

## 2021-05-28 DIAGNOSIS — R0602 Shortness of breath: Secondary | ICD-10-CM

## 2021-05-28 DIAGNOSIS — G4733 Obstructive sleep apnea (adult) (pediatric): Secondary | ICD-10-CM | POA: Diagnosis present

## 2021-05-28 DIAGNOSIS — I5033 Acute on chronic diastolic (congestive) heart failure: Secondary | ICD-10-CM | POA: Diagnosis present

## 2021-05-28 DIAGNOSIS — Z7901 Long term (current) use of anticoagulants: Secondary | ICD-10-CM

## 2021-05-28 DIAGNOSIS — Z6838 Body mass index (BMI) 38.0-38.9, adult: Secondary | ICD-10-CM

## 2021-05-28 DIAGNOSIS — I480 Paroxysmal atrial fibrillation: Secondary | ICD-10-CM | POA: Diagnosis present

## 2021-05-28 DIAGNOSIS — E119 Type 2 diabetes mellitus without complications: Secondary | ICD-10-CM

## 2021-05-28 DIAGNOSIS — K5900 Constipation, unspecified: Secondary | ICD-10-CM | POA: Diagnosis present

## 2021-05-28 DIAGNOSIS — E669 Obesity, unspecified: Secondary | ICD-10-CM | POA: Diagnosis present

## 2021-05-28 DIAGNOSIS — Z66 Do not resuscitate: Secondary | ICD-10-CM | POA: Diagnosis present

## 2021-05-28 DIAGNOSIS — Z7984 Long term (current) use of oral hypoglycemic drugs: Secondary | ICD-10-CM

## 2021-05-28 DIAGNOSIS — J9612 Chronic respiratory failure with hypercapnia: Secondary | ICD-10-CM

## 2021-05-28 DIAGNOSIS — J9621 Acute and chronic respiratory failure with hypoxia: Secondary | ICD-10-CM | POA: Diagnosis present

## 2021-05-28 DIAGNOSIS — J441 Chronic obstructive pulmonary disease with (acute) exacerbation: Secondary | ICD-10-CM | POA: Diagnosis present

## 2021-05-28 DIAGNOSIS — R7989 Other specified abnormal findings of blood chemistry: Secondary | ICD-10-CM | POA: Diagnosis present

## 2021-05-28 DIAGNOSIS — Z87891 Personal history of nicotine dependence: Secondary | ICD-10-CM

## 2021-05-28 DIAGNOSIS — M1711 Unilateral primary osteoarthritis, right knee: Secondary | ICD-10-CM | POA: Diagnosis present

## 2021-05-28 DIAGNOSIS — E871 Hypo-osmolality and hyponatremia: Secondary | ICD-10-CM | POA: Diagnosis present

## 2021-05-28 DIAGNOSIS — R1030 Lower abdominal pain, unspecified: Secondary | ICD-10-CM | POA: Diagnosis present

## 2021-05-28 DIAGNOSIS — N1831 Chronic kidney disease, stage 3a: Secondary | ICD-10-CM | POA: Diagnosis present

## 2021-05-28 DIAGNOSIS — R778 Other specified abnormalities of plasma proteins: Secondary | ICD-10-CM | POA: Diagnosis present

## 2021-05-28 DIAGNOSIS — D631 Anemia in chronic kidney disease: Secondary | ICD-10-CM | POA: Diagnosis present

## 2021-05-28 DIAGNOSIS — N183 Chronic kidney disease, stage 3 unspecified: Secondary | ICD-10-CM | POA: Diagnosis present

## 2021-05-28 DIAGNOSIS — I509 Heart failure, unspecified: Secondary | ICD-10-CM

## 2021-05-28 DIAGNOSIS — R9431 Abnormal electrocardiogram [ECG] [EKG]: Secondary | ICD-10-CM | POA: Diagnosis not present

## 2021-05-28 DIAGNOSIS — J449 Chronic obstructive pulmonary disease, unspecified: Secondary | ICD-10-CM | POA: Diagnosis present

## 2021-05-28 DIAGNOSIS — Z794 Long term (current) use of insulin: Secondary | ICD-10-CM

## 2021-05-28 DIAGNOSIS — E1165 Type 2 diabetes mellitus with hyperglycemia: Secondary | ICD-10-CM | POA: Diagnosis present

## 2021-05-28 DIAGNOSIS — Z87442 Personal history of urinary calculi: Secondary | ICD-10-CM

## 2021-05-28 DIAGNOSIS — I272 Pulmonary hypertension, unspecified: Secondary | ICD-10-CM | POA: Diagnosis present

## 2021-05-28 DIAGNOSIS — R112 Nausea with vomiting, unspecified: Secondary | ICD-10-CM | POA: Diagnosis not present

## 2021-05-28 LAB — COMPREHENSIVE METABOLIC PANEL
ALT: 12 U/L (ref 0–44)
AST: 18 U/L (ref 15–41)
Albumin: 3.8 g/dL (ref 3.5–5.0)
Alkaline Phosphatase: 70 U/L (ref 38–126)
Anion gap: 11 (ref 5–15)
BUN: 35 mg/dL — ABNORMAL HIGH (ref 8–23)
CO2: 31 mmol/L (ref 22–32)
Calcium: 9.1 mg/dL (ref 8.9–10.3)
Chloride: 80 mmol/L — ABNORMAL LOW (ref 98–111)
Creatinine, Ser: 1.05 mg/dL — ABNORMAL HIGH (ref 0.44–1.00)
GFR, Estimated: 55 mL/min — ABNORMAL LOW (ref >60)
Glucose, Bld: 257 mg/dL — ABNORMAL HIGH (ref 70–99)
Potassium: 2.8 mmol/L — ABNORMAL LOW (ref 3.5–5.1)
Sodium: 122 mmol/L — ABNORMAL LOW (ref 135–145)
Total Bilirubin: 1 mg/dL (ref 0.3–1.2)
Total Protein: 7 g/dL (ref 6.5–8.1)

## 2021-05-28 LAB — URINALYSIS, COMPLETE (UACMP) WITH MICROSCOPIC
Bilirubin Urine: NEGATIVE
Glucose, UA: 50 mg/dL — AB
Hgb urine dipstick: NEGATIVE
Ketones, ur: NEGATIVE mg/dL
Nitrite: NEGATIVE
Protein, ur: NEGATIVE mg/dL
Specific Gravity, Urine: 1.004 — ABNORMAL LOW (ref 1.005–1.030)
Squamous Epithelial / HPF: NONE SEEN (ref 0–5)
pH: 7 (ref 5.0–8.0)

## 2021-05-28 LAB — CBC
HCT: 33.4 % — ABNORMAL LOW (ref 36.0–46.0)
Hemoglobin: 11.3 g/dL — ABNORMAL LOW (ref 12.0–15.0)
MCH: 28.3 pg (ref 26.0–34.0)
MCHC: 33.8 g/dL (ref 30.0–36.0)
MCV: 83.7 fL (ref 80.0–100.0)
Platelets: 198 10*3/uL (ref 150–400)
RBC: 3.99 MIL/uL (ref 3.87–5.11)
RDW: 14.8 % (ref 11.5–15.5)
WBC: 10.4 10*3/uL (ref 4.0–10.5)
nRBC: 0 % (ref 0.0–0.2)

## 2021-05-28 LAB — MAGNESIUM: Magnesium: 2 mg/dL (ref 1.7–2.4)

## 2021-05-28 LAB — TSH: TSH: 3.386 u[IU]/mL (ref 0.350–4.500)

## 2021-05-28 LAB — TROPONIN I (HIGH SENSITIVITY)
Troponin I (High Sensitivity): 20 ng/L — ABNORMAL HIGH (ref <18)
Troponin I (High Sensitivity): 20 ng/L — ABNORMAL HIGH (ref <18)

## 2021-05-28 LAB — BRAIN NATRIURETIC PEPTIDE: B Natriuretic Peptide: 279.2 pg/mL — ABNORMAL HIGH (ref 0.0–100.0)

## 2021-05-28 MED ORDER — ONDANSETRON HCL 4 MG/2ML IJ SOLN: 4.0000 mg | Freq: Four times a day (QID) | INTRAMUSCULAR | Status: DC | PRN

## 2021-05-28 MED ORDER — ALLOPURINOL 100 MG PO TABS
100.0000 mg | ORAL_TABLET | Freq: Every day | ORAL | Status: DC
Start: 2021-05-29 — End: 2021-06-02
  Administered 2021-05-29 – 2021-06-02 (×5): 100 mg via ORAL
  Filled 2021-05-28 (×5): qty 1

## 2021-05-28 MED ORDER — LOSARTAN POTASSIUM 25 MG PO TABS
25.0000 mg | ORAL_TABLET | Freq: Every day | ORAL | Status: DC
  Administered 2021-05-29 – 2021-06-02 (×5): 25 mg via ORAL
  Filled 2021-05-28 (×5): qty 1

## 2021-05-28 MED ORDER — SENNOSIDES-DOCUSATE SODIUM 8.6-50 MG PO TABS: 1.0000 | ORAL_TABLET | Freq: Two times a day (BID) | ORAL | Status: DC | PRN

## 2021-05-28 MED ORDER — SODIUM CHLORIDE 0.9 % IV SOLN: 250.0000 mL | INTRAVENOUS | Status: DC | PRN

## 2021-05-28 MED ORDER — MELATONIN 5 MG PO TABS
5.0000 mg | ORAL_TABLET | Freq: Every day | ORAL | Status: DC
Start: 2021-05-28 — End: 2021-06-02
  Administered 2021-05-28 – 2021-06-01 (×5): 5 mg via ORAL
  Filled 2021-05-28 (×5): qty 1

## 2021-05-28 MED ORDER — POTASSIUM CHLORIDE 10 MEQ/100ML IV SOLN: 10.0000 meq | INTRAVENOUS | Status: DC

## 2021-05-28 MED ORDER — ONDANSETRON HCL 4 MG/2ML IJ SOLN
4.0000 mg | Freq: Once | INTRAMUSCULAR | Status: AC
  Administered 2021-05-28: 4 mg via INTRAVENOUS
  Filled 2021-05-28: qty 2

## 2021-05-28 MED ORDER — ATORVASTATIN CALCIUM 10 MG PO TABS
10.0000 mg | ORAL_TABLET | Freq: Every day | ORAL | Status: DC
  Administered 2021-05-29 – 2021-06-02 (×5): 10 mg via ORAL
  Filled 2021-05-28 (×5): qty 1

## 2021-05-28 MED ORDER — FLUTICASONE PROPIONATE 50 MCG/ACT NA SUSP
2.0000 | Freq: Every day | NASAL | Status: DC | PRN
  Filled 2021-05-28: qty 16

## 2021-05-28 MED ORDER — MONTELUKAST SODIUM 10 MG PO TABS
10.0000 mg | ORAL_TABLET | Freq: Every day | ORAL | Status: DC
  Administered 2021-05-28 – 2021-06-01 (×5): 10 mg via ORAL
  Filled 2021-05-28 (×5): qty 1

## 2021-05-28 MED ORDER — APIXABAN 5 MG PO TABS
5.0000 mg | ORAL_TABLET | Freq: Two times a day (BID) | ORAL | Status: DC
  Administered 2021-05-28 – 2021-06-02 (×10): 5 mg via ORAL
  Filled 2021-05-28 (×10): qty 1

## 2021-05-28 MED ORDER — SODIUM CHLORIDE 0.9% FLUSH
3.0000 mL | Freq: Two times a day (BID) | INTRAVENOUS | Status: DC
  Administered 2021-05-28 – 2021-06-02 (×10): 3 mL via INTRAVENOUS

## 2021-05-28 MED ORDER — INSULIN GLARGINE-YFGN 100 UNIT/ML ~~LOC~~ SOLN
32.0000 [IU] | Freq: Every day | SUBCUTANEOUS | Status: DC
  Administered 2021-05-29 – 2021-05-30 (×2): 32 [IU] via SUBCUTANEOUS
  Filled 2021-05-28 (×2): qty 0.32

## 2021-05-28 MED ORDER — SODIUM CHLORIDE 0.9% FLUSH: 3.0000 mL | INTRAVENOUS | Status: DC | PRN

## 2021-05-28 MED ORDER — PANTOPRAZOLE SODIUM 40 MG PO TBEC
40.0000 mg | DELAYED_RELEASE_TABLET | Freq: Every day | ORAL | Status: DC
  Administered 2021-05-29 – 2021-06-02 (×5): 40 mg via ORAL
  Filled 2021-05-28 (×5): qty 1

## 2021-05-28 MED ORDER — TRAZODONE HCL 50 MG PO TABS
50.0000 mg | ORAL_TABLET | Freq: Every evening | ORAL | Status: DC | PRN
  Administered 2021-05-30 – 2021-06-01 (×3): 50 mg via ORAL
  Filled 2021-05-28 (×3): qty 1

## 2021-05-28 MED ORDER — ACETAMINOPHEN 325 MG PO TABS
650.0000 mg | ORAL_TABLET | ORAL | Status: DC | PRN
  Administered 2021-06-01 – 2021-06-02 (×2): 650 mg via ORAL
  Filled 2021-05-28 (×2): qty 2

## 2021-05-28 MED ORDER — TRIMETHOBENZAMIDE HCL 100 MG/ML IM SOLN
200.0000 mg | Freq: Four times a day (QID) | INTRAMUSCULAR | Status: DC | PRN
  Filled 2021-05-28: qty 2

## 2021-05-28 MED ORDER — POTASSIUM CHLORIDE 10 MEQ/100ML IV SOLN
10.0000 meq | Freq: Once | INTRAVENOUS | Status: AC
  Administered 2021-05-28: 10 meq via INTRAVENOUS
  Filled 2021-05-28: qty 100

## 2021-05-28 MED ORDER — FUROSEMIDE 10 MG/ML IJ SOLN
40.0000 mg | Freq: Two times a day (BID) | INTRAMUSCULAR | Status: DC
  Administered 2021-05-29 – 2021-05-31 (×5): 40 mg via INTRAVENOUS
  Filled 2021-05-28 (×5): qty 4

## 2021-05-28 MED ORDER — IPRATROPIUM-ALBUTEROL 0.5-2.5 (3) MG/3ML IN SOLN: 3.0000 mL | Freq: Four times a day (QID) | RESPIRATORY_TRACT | Status: DC | PRN

## 2021-05-28 MED ORDER — IPRATROPIUM BROMIDE 0.06 % NA SOLN
2.0000 | Freq: Four times a day (QID) | NASAL | Status: DC | PRN
  Filled 2021-05-28: qty 15

## 2021-05-28 MED ORDER — FLUTICASONE-UMECLIDIN-VILANT 100-62.5-25 MCG/INH IN AEPB: 1.0000 | INHALATION_SPRAY | Freq: Every day | RESPIRATORY_TRACT | Status: DC

## 2021-05-28 MED ORDER — FLUTICASONE FUROATE-VILANTEROL 100-25 MCG/ACT IN AEPB
1.0000 | INHALATION_SPRAY | Freq: Every day | RESPIRATORY_TRACT | Status: DC
  Administered 2021-05-29 – 2021-06-02 (×5): 1 via RESPIRATORY_TRACT
  Filled 2021-05-28: qty 28

## 2021-05-28 MED ORDER — INSULIN ASPART 100 UNIT/ML IJ SOLN
10.0000 [IU] | Freq: Three times a day (TID) | INTRAMUSCULAR | Status: DC
  Administered 2021-05-29 – 2021-06-02 (×14): 10 [IU] via SUBCUTANEOUS
  Filled 2021-05-28 (×15): qty 1

## 2021-05-28 MED ORDER — POTASSIUM CHLORIDE CRYS ER 20 MEQ PO TBCR
60.0000 meq | EXTENDED_RELEASE_TABLET | ORAL | Status: AC
  Administered 2021-05-28: 60 meq via ORAL
  Filled 2021-05-28: qty 3

## 2021-05-28 MED ORDER — UMECLIDINIUM BROMIDE 62.5 MCG/ACT IN AEPB
1.0000 | INHALATION_SPRAY | Freq: Every day | RESPIRATORY_TRACT | Status: DC
  Administered 2021-05-29 – 2021-06-02 (×5): 1 via RESPIRATORY_TRACT
  Filled 2021-05-28: qty 7

## 2021-05-28 MED ORDER — INSULIN ASPART 100 UNIT/ML IJ SOLN: 0.0000 [IU] | Freq: Three times a day (TID) | INTRAMUSCULAR | Status: DC

## 2021-05-28 MED ORDER — INSULIN ASPART 100 UNIT/ML IJ SOLN
0.0000 [IU] | Freq: Three times a day (TID) | INTRAMUSCULAR | Status: DC
  Administered 2021-05-29: 3 [IU] via SUBCUTANEOUS
  Administered 2021-05-29 – 2021-05-30 (×4): 2 [IU] via SUBCUTANEOUS
  Administered 2021-05-31: 3 [IU] via SUBCUTANEOUS
  Filled 2021-05-28 (×4): qty 1

## 2021-05-28 MED ORDER — FUROSEMIDE 10 MG/ML IJ SOLN
60.0000 mg | Freq: Once | INTRAMUSCULAR | Status: AC
  Administered 2021-05-28: 60 mg via INTRAVENOUS
  Filled 2021-05-28: qty 8

## 2021-05-28 NOTE — Assessment & Plan Note (Addendum)
Continue anticoagulation with Eliquis ?

## 2021-05-28 NOTE — H&P (Addendum)
?History and Physical  ? ? ?Patient: Karen [name] WIO:[id number] DOB: 18-May-1945 ?DOA: 05/28/2021 ?DOS: the patient was seen and examined on 05/28/2021 ?PCP: Earlie Counts, FNP  ?Patient coming from: Home ? ?Chief Complaint:  ?Chief Complaint  ?Patient presents with  ? Shortness of Breath  ? ?HPI: Karen [name] is a 76 y.o. female with medical history significant of HTN, diastolic CHF noted to be 60 -65% with grade 1 diastolic dysfunction, COPD on 3 L, DM type II, and anemia presents with complaints of worsening shortness of breath over the last day.  She complains of feeling more short of breath than usual.  Her husband had been checking her weight on a daily basis and she ranged from 206-208 pounds.  He checked her weight yesterday and it was 208, but this morning it was elevated at 218.  Denied having any significant fever, chills, chest pain, cough, dysuria, orthopnea symptoms.  Associated symptoms include lower extremity swelling which is reported to be chronic, lethargy, lower abdominal pain, nausea, and vomiting.  Her husband took her to urgent care initially this morning and she kept falling asleep and was intermittently jerking and they recommended her to come to the emergency department. ? ? ?Upon admission into the emergency department patient was noted to be afebrile with O2 saturations maintained on home 3 L, and all other vital signs stable.  Labs significant for hemoglobin 11.3, sodium 122, potassium 2.8 chloride 80, BUN 35, creatinine 1.05, glucose 257, BNP 279.2, high-sensitivity troponin 20.  Chest x-ray shows stable cardiomegaly with mild to moderate severity interstitial edema.  Patient has been given 10 mEq of potassium chloride, Zofran, and furosemide 60 mg IV. ? ? ?Review of Systems: As mentioned in the history of present illness. All other systems reviewed and are negative. ?Past Medical History:  ?Diagnosis Date  ? (HFpEF) heart failure with preserved ejection fraction ([place])   ? a. 2017  Echo: EF 50%; b. 06/2018 Echo: EF 50-55%; c. 08/2018 Echo: EF 50-55%, Nl RV fxn; d. 09/2019 Echo: EF 55-60%, no rwma, mild LVH, Gr1 DD, nl RV size/fxn, PASP 63.70mmHg. Mildly dil LA. Triv MR. Mod AS (AoV 0.94cm^2 VTI; mean grad 17.3mmHg).  ? Acute on chronic respiratory failure with hypoxia and hypercapnia ([place]) 01/07/2015  ? Anemia   ? Asterixis 01/07/2015  ? Asthma   ? Cataract   ? CKD (chronic kidney disease), stage III ([place])   ? COPD (chronic obstructive pulmonary disease) ([place])   ? a. 06/2018 tobacco use, home 3L oxygen   ? Diabetes mellitus without complication ([place])   ? a. 09/2019 A1C 8.8  ? Edema, peripheral 04/20/2014  ? GI bleed 06/28/2019  ? History of kidney stones   ? Hyperlipidemia   ? Hypertension   ? Iron deficiency anemia 06/22/2014  ? Junctional bradycardia   ? a. In setting of beta blocker therapy.  ? Leucocytosis 10/19/2015  ? Moderate aortic stenosis   ? a.  09/2019 Echo: Mod AS (AoV 0.94cm^2 VTI; mean grad 17.54mmHg).  ? Morbid obesity ([place])   ? Overactive bladder   ? Primary osteoarthritis of right knee 09/01/2016  ? Sciatica 01/07/2015  ? ?Past Surgical History:  ?Procedure Laterality Date  ? APPENDECTOMY    ? CESAREAN SECTION    ? x3  ? CHOLECYSTECTOMY    ? COLONOSCOPY WITH PROPOFOL N/A 08/28/2017  ? Procedure: COLONOSCOPY WITH PROPOFOL;  Surgeon: Lucilla Lame, MD;  Location: [facility] ENDOSCOPY;  Service: Endoscopy;  Laterality: N/A;  ? COLONOSCOPY WITH  PROPOFOL N/A 08/29/2017  ? Procedure: COLONOSCOPY WITH PROPOFOL;  Surgeon: Lucilla Lame, MD;  Location: [facility] ENDOSCOPY;  Service: Endoscopy;  Laterality: N/A;  ? CYSTOSCOPY W/ URETERAL STENT PLACEMENT Right 09/15/2017  ? Procedure: CYSTOSCOPY WITH RETROGRADE PYELOGRAM/URETERAL STENT PLACEMENT;  Surgeon: Cleon Gustin, MD;  Location: [facility] ORS;  Service: Urology;  Laterality: Right;  ? CYSTOSCOPY/URETEROSCOPY/HOLMIUM LASER/STENT PLACEMENT Right 10/09/2017  ? Procedure: CYSTOSCOPY/URETEROSCOPY/HOLMIUM LASER/STENT PLACEMENT;  Surgeon: Abbie Sons, MD;   Location: [facility] ORS;  Service: Urology;  Laterality: Right;  right Stent exchange  ? ESOPHAGOGASTRODUODENOSCOPY (EGD) WITH PROPOFOL N/A 09/16/2018  ? Procedure: ESOPHAGOGASTRODUODENOSCOPY (EGD) WITH PROPOFOL;  Surgeon: Lin Landsman, MD;  Location: [facility] ENDOSCOPY;  Service: Gastroenterology;  Laterality: N/A;  ? EYE SURGERY    ? IR THORACENTESIS ASP PLEURAL SPACE W/IMG GUIDE  02/07/2021  ? ?Social History:  reports that she quit smoking about 28 years ago. Her smoking use included cigarettes. She has a 20.00 pack-year smoking history. She has never used smokeless tobacco. She reports that she does not drink alcohol and does not use drugs. ? ?Allergies  ?Allergen Reactions  ? Ace Inhibitors Hives  ? Beta Adrenergic Blockers   ?  Junctional bradycardia  ? Gabapentin Hives  ? Lisinopril Hives  ? Lyrica [Pregabalin] Hives  ? Shrimp [Shellfish Allergy] Swelling  ?  Swelling of the lips  ? ? ?Family History  ?Problem Relation Age of Onset  ? Other Mother   ?     unknown medical history  ? Other Father   ?     unknown medical history  ? ? ?Prior to Admission medications   ?Medication Sig Start Date End Date Taking? Authorizing Provider  ?acetaminophen (TYLENOL) 500 MG tablet Take 1-2 tablets (500-1,000 mg total) by mouth every 6 (six) hours as needed for mild pain, fever, moderate pain or headache. Do not take more than 4 grams a day 02/05/21   Mercy Riding, MD  ?albuterol (VENTOLIN HFA) 108 (90 Base) MCG/ACT inhaler Inhale 2 puffs into the lungs every 6 (six) hours as needed for wheezing or shortness of breath.     [provider]  ?allopurinol (ZYLOPRIM) 100 MG tablet Take 1 tablet by mouth daily.    [provider]  ?apixaban (ELIQUIS) 5 MG TABS tablet Take 1 tablet by mouth twice daily 04/06/21   [name], MD  ?atorvastatin (LIPITOR) 10 MG tablet Take 1 tablet by mouth daily. 12/17/20   [provider]  ?Dulaglutide 3 MG/0.5ML SOPN Inject 0.5 mLs into the skin once a week. 04/11/21    [provider]  ?esomeprazole (NEXIUM) 40 MG capsule Take 40 mg by mouth daily. 12/17/18   [provider]  ?Ferrous Sulfate (IRON) 325 (65 Fe) MG TABS Take 1 tablet (325 mg total) by mouth daily. 02/12/21   Loletha Grayer, MD  ?fluticasone (FLONASE) 50 MCG/ACT nasal spray Place 2 sprays into both nostrils daily. 11/15/18   [provider]  ?Fluticasone-Umeclidin-Vilant 100-62.5-25 MCG/INH AEPB Inhale 1 puff into the lungs daily. 12/25/17   [provider]  ?furosemide (LASIX) 20 MG tablet Take 2 tablets (40 mg total) by mouth 2 (two) times daily. 05/13/21   [name], MD  ?hydrochlorothiazide (HYDRODIURIL) 25 MG tablet Take 1 tablet by mouth once daily 05/19/21   [name], MD  ?insulin glargine (LANTUS SOLOSTAR) 100 UNIT/ML Solostar Pen Inject 32 Units into the skin daily. 02/12/21   Loletha Grayer, MD  ?insulin lispro (HUMALOG) 100 UNIT/ML KwikPen Inject  10 Units into the skin in the morning, at noon, and at bedtime. 01/22/21   [provider]  ?ipratropium (ATROVENT) 0.06 % nasal spray Place 2 sprays into both nostrils 4 (four) times daily. 05/28/20   Margarette Canada, NP  ?ipratropium-albuterol (DUONEB) 0.5-2.5 (3) MG/3ML SOLN Take 3 mLs by nebulization 4 (four) times daily as needed. 10/20/20   [provider]  ?losartan (COZAAR) 50 MG tablet Take 25 mg by mouth daily. 01/16/21   [provider]  ?melatonin 5 MG TABS Take 5 mg by mouth at bedtime. 11/10/20   [provider]  ?metFORMIN (GLUCOPHAGE) 500 MG tablet Take 500 mg by mouth 2 (two) times daily. 04/16/21   [provider]  ?montelukast (SINGULAIR) 10 MG tablet Take 10 mg by mouth at bedtime.    [provider]  ?senna-docusate (SENOKOT-S) 8.6-50 MG tablet Take 1 tablet by mouth 2 (two) times daily between meals as needed for mild constipation. 02/05/21   Mercy Riding, MD  ?traZODone (DESYREL) 50 MG tablet Take 1 tablet (50 mg total) by mouth at bedtime as  needed for sleep. 02/12/21   Loletha Grayer, MD  ?vitamin B-12 (CYANOCOBALAMIN) 500 MCG tablet Take 500 mcg by mouth daily.    [provider]  ?VITAMIN D, CHOLECALCIFEROL, PO Take 1 tablet by

## 2021-05-28 NOTE — ED Triage Notes (Signed)
Patient's husband states that she has had increased swelling and SOB that started this morning.  Patient has CHF.   ?

## 2021-05-28 NOTE — Assessment & Plan Note (Addendum)
Mild elevation troponin secondary to congestive heart failure exacerbation. ?

## 2021-05-28 NOTE — Assessment & Plan Note (Deleted)
O2 saturation maintained on home 3 L of oxygen. ?-Continue nasal cannula oxygen to maintain O2 saturation greater than 92% ?

## 2021-05-28 NOTE — ED Provider Notes (Signed)
? ?[facility] ?Provider Note ? ?Patient Contact: 3:12 PM (approximate) ? ? ?History  ? ?Shortness of Breath and Leg Swelling ? ? ?HPI ? ?Karen [name] is a 76 y.o. female who presents the urgent care complaining of increased shortness of breath, weight gain.  Patient has a history of CHF, states that she put on 10 pounds overnight.  Patient states that she is very tired, fatigued, reporting shortness of breath.  Patient went from 206 pounds to 218 pounds according to the husband.  They present for evaluation.  Patient is on oxygen chronically, states that she has not increased her oxygen from 3 L.  Denies any chest pain.  Patient states that she has had 3 episodes of CHF exacerbation this year requiring hospital admission, rehab stay. ?  ? ? ?Physical Exam  ? ?Triage Vital Signs: ?ED Triage Vitals [05/28/21 1511]  ?Enc Vitals Group  ?   BP   ?   Pulse   ?   Resp   ?   Temp   ?   Temp src   ?   SpO2   ?   Weight   ?   Height   ?   Head Circumference   ?   Peak Flow   ?   Pain Score 0  ?   Pain Loc   ?   Pain Edu?   ?   Excl. in [place]?   ? ? ?Most recent vital signs: ?Vitals:  ? 05/28/21 1514  ?BP: 129/62  ?Pulse: 77  ?Resp: 16  ?Temp: 97.7 ?F (36.5 ?C)  ?SpO2: 95%  ? ? ? ?General: Alert and in no acute distress.  ?Neck: No stridor. No cervical spine tenderness to palpation  ?Cardiovascular:  Good peripheral perfusion with pulses intact, peripheral edema noted bilaterally 3+ pitting edema. ?Respiratory: Normal respiratory effort without tachypnea or retractions. Lungs with crackles.  No wheezing.  Decreased breath sounds in the lower lung fields bilaterally. ?Musculoskeletal: Full range of motion to all extremities.  ?Neurologic:  No gross focal neurologic deficits are appreciated.  ?Skin:   No rash noted ?Other: ? ? ?ED Results / Procedures / Treatments  ? ?Labs ?(all labs ordered are listed, but only abnormal results are displayed) ?Labs Reviewed - No data to display ? ? ?EKG ? ?ED ECG  REPORT ?ICharline Bills Macon Lesesne,  personally viewed and interpreted this ECG. ? ? Date: 05/28/2021 ? EKG Time: 1519 hrs. ? Rate: 77 bpm ? Rhythm: unchanged from previous tracings, normal sinus rhythm ? Axis: Normal axis ? Intervals:none ? ST&T Change: No ST elevation or depression noted. ? ?Sinus rhythm with PAC.  No STEMI.  No significant change from previous EKGs. ? ? ? ?RADIOLOGY ? ? ? ?No results found. ? ?PROCEDURES: ? ?Critical Care performed: No ? ?Procedures ? ? ?MEDICATIONS ORDERED IN ED: ?Medications - No data to display ? ? ?IMPRESSION / MDM / ASSESSMENT AND PLAN / ED COURSE  ?I reviewed the triage vital signs and the nursing notes. ?             ?               ? ?Differential diagnosis includes, but is not limited to, CHF exacerbation, STEMI, COPD, asthma, viral illness, COVID ? ? ?Patient's diagnosis is consistent with CHF exacerbation.  Patient presented to the urgent care complaining of shortness of breath, increased weight gain.  Patient has a history of CHF, has had several issues in  the past several months with exacerbations requiring admission and rehab.  Patient presents today with worsening shortness of breath and a 10 pound weight gain overnight.  Patient does weigh daily and went from a reported 206 to 218 today.  Patient is on oxygen, states that she has not increased her oxygen from her 3 L.  She was at 95% but slightly tacky at a rate of 22 in the room.  Patient does have increasing peripheral edema which is pitting at this time.  Given her history I feel that patient will require evaluation and management in the emergency department with possible admission.  EKG was performed here with normal sinus rhythm with PACs.  No STEMI.  No labs or imaging is performed prior to transfer to the ED.  As such I will transfer the patient via EMS at the request of the family to [facility] regional for further evaluation and management of likely CHF exacerbation..  Patient care transferred to EMS at this  time for transport. ? ? ?  ? ? ?FINAL CLINICAL IMPRESSION(S) / ED DIAGNOSES  ? ?Final diagnoses:  ?Acute on chronic diastolic congestive heart failure ([place])  ?SOB (shortness of breath)  ? ? ? ?Rx / DC Orders  ? ?ED Discharge Orders   ? ? None  ? ?  ? ? ? ?Note:  This document was prepared using Dragon voice recognition software and may include unintentional dictation errors. ?  ?Darletta Moll, PA-C ?05/28/21 1539 ? ?

## 2021-05-28 NOTE — ED Notes (Signed)
Pt was given sandwich tray and water. 

## 2021-05-28 NOTE — Assessment & Plan Note (Addendum)
Improving.

## 2021-05-28 NOTE — Assessment & Plan Note (Addendum)
Hemoglobin stable 

## 2021-05-28 NOTE — Assessment & Plan Note (Addendum)
Renal function is stable.

## 2021-05-28 NOTE — ED Triage Notes (Signed)
Pt coming from [facility] urgent care via [facility]. Per EMS pt went to [facility] urgent care this morning and has gain 10 lbs since yesterday and urgent care sent pt to ED.  ? ?Pt states she has a hx of CHF and chronically 3L [place] at all times. Pt does states she feels SOB but denies any chest pain.  ?

## 2021-05-28 NOTE — ED Notes (Signed)
Patient is being discharged from the Urgent Care and sent to the [facility] Emergency Department via EMS . Per Jonathon C. PA, patient is in need of higher level of care due to CHF. Patient is aware and verbalizes understanding of plan of care.  ?Vitals:  ? 05/28/21 1514  ?BP: 129/62  ?Pulse: 77  ?Resp: 16  ?Temp: 97.7 ?F (36.5 ?C)  ?SpO2: 95%  ?  ?

## 2021-05-28 NOTE — Assessment & Plan Note (Addendum)
On admission QTc was noted to be 505. ?Follow electrolytes, avoid medication that may further prolong QT interval. ?

## 2021-05-28 NOTE — Assessment & Plan Note (Addendum)
UA has no evidence of UTI, abdominal cramping is better.  This is probably is due to constipation, had a bowel movement yesterday, no additional abdominal pain. ?

## 2021-05-28 NOTE — Assessment & Plan Note (Deleted)
Continue scheduled long-acting insulin and sliding scale insulin.  We will make adjustment if needed.  Hemoglobin A1c 10.2 ?

## 2021-05-28 NOTE — Assessment & Plan Note (Deleted)
-   Continue Eliquis 

## 2021-05-28 NOTE — Assessment & Plan Note (Addendum)
Resolved

## 2021-05-28 NOTE — ED Provider Notes (Signed)
? ?[facility] ?Provider Note ? ? ? Event Date/Time  ? First MD Initiated Contact with Patient 05/28/21 1607   ?  (approximate) ? ?History  ? ?Chief Complaint: Shortness of Breath ? ?HPI ? ?Karen [name] is a 76 y.o. female with a past medical history of CHF, CKD, COPD, hypertension, hyperlipidemia, presents to the emergency department for shortness of breath and weight gain.  According to the patient she states she has gained approximately 10 pounds of weight overnight between yesterday and today.  States she felt more short of breath today so she went to an urgent care for evaluation and was ultimately sent to the emergency department.  Patient denies any chest pain.  No fever cough or congestion.  Patient wears 3 L of oxygen chronically currently satting in the mid 90s on 3 L. ? ?Physical Exam  ? ?Most recent vital signs: ?Vitals:  ? 05/28/21 1604  ?SpO2: 92%  ? ? ?General: Awake, no distress.  ?CV:  Good peripheral perfusion.  Regular rate and rhythm  ?Resp:  Normal effort.  Equal breath sounds bilaterally.  ?Abd:  No distention.  Soft, nontender.  No rebound or guarding. ?Other:  Mild lower extremity edema. ? ? ?ED Results / Procedures / Treatments  ? ?EKG ? ?EKG viewed and interpreted by myself shows a normal sinus rhythm at 71 bpm with a narrow QRS, normal axis, normal intervals besides slight QTc prolongation.  No concerning ST changes. ? ?RADIOLOGY ? ?I have personally reviewed the chest x-ray images does appear consistent with pulmonary edema. ?Radiology is read the x-ray as moderate interstitial edema. ? ? ?MEDICATIONS ORDERED IN ED: ?Medications - No data to display ? ? ?IMPRESSION / MDM / ASSESSMENT AND PLAN / ED COURSE  ?I reviewed the triage vital signs and the nursing notes. ? ?Patient presents to the emergency department for weight gain overnight and shortness of breath.  Patient denies any recent chest pain.  Overall the patient appears well, no distress.  Patient does have  mild lower extremity edema.  Patient satting currently in the mid 90s on 3 L nasal cannula which is her baseline.  Given the patient's complaint of shortness of breath with increased fluid retention we will check labs including a BNP and troponin.  We will obtain a chest x-ray to evaluate for changes of CHF/pulmonary edema.  Patient agreeable to plan of care. ? ?Patient's labs have resulted showing hyponatremia this could very likely be due to dilution/fluid overload.  Mildly elevated troponin at 20.  Elevated BNP at 279.  Chemistry also shows hypokalemia we will IV replete.  CBC is normal.  Given the patient's chest x-ray showing interstitial edema as well as the patient's hyponatremia and elevated BNP will begin the patient on IV Lasix.  Will admit to the hospital service for further management.  Patient agreeable to plan of care. ? ?CRITICAL CARE ?Performed by: Harvest Dark ? ? ?Total critical care time: 30 minutes ? ?Critical care time was exclusive of separately billable procedures and treating other patients. ? ?Critical care was necessary to treat or prevent imminent or life-threatening deterioration. ? ?Critical care was time spent personally by me on the following activities: development of treatment plan with patient and/or surrogate as well as nursing, discussions with consultants, evaluation of patient's response to treatment, examination of patient, obtaining history from patient or surrogate, ordering and performing treatments and interventions, ordering and review of laboratory studies, ordering and review of radiographic studies, pulse oximetry and  re-evaluation of patient's condition. ? ? ?FINAL CLINICAL IMPRESSION(S) / ED DIAGNOSES  ? ?Dyspnea ?CHF exacerbation acute on chronic ?Hypokalemia ? ? ?Note:  This document was prepared using Dragon voice recognition software and may include unintentional dictation errors. ?  ?Harvest Dark, MD ?05/28/21 1732 ? ?

## 2021-05-28 NOTE — Assessment & Plan Note (Addendum)
Patient has morbid obesity with BMI 39.9 with acute on chronic congestive heart failure. ?

## 2021-05-28 NOTE — Assessment & Plan Note (Addendum)
No bronchospasm. ?

## 2021-05-28 NOTE — Assessment & Plan Note (Addendum)
Improved

## 2021-05-28 NOTE — Assessment & Plan Note (Addendum)
Recent echocardiogram in December 2022 showed ejection fraction 60 to 89%, grade 1 diastolic dysfunction and severe pulmonary hypertension.   ?Volume status improved, change IV Lasix to oral. ?

## 2021-05-29 DIAGNOSIS — I480 Paroxysmal atrial fibrillation: Secondary | ICD-10-CM | POA: Diagnosis not present

## 2021-05-29 DIAGNOSIS — G4733 Obstructive sleep apnea (adult) (pediatric): Secondary | ICD-10-CM

## 2021-05-29 DIAGNOSIS — I272 Pulmonary hypertension, unspecified: Secondary | ICD-10-CM

## 2021-05-29 DIAGNOSIS — I5033 Acute on chronic diastolic (congestive) heart failure: Secondary | ICD-10-CM | POA: Diagnosis not present

## 2021-05-29 DIAGNOSIS — E871 Hypo-osmolality and hyponatremia: Secondary | ICD-10-CM | POA: Diagnosis not present

## 2021-05-29 LAB — GLUCOSE, CAPILLARY
Glucose-Capillary: 271 mg/dL — ABNORMAL HIGH (ref 70–99)
Glucose-Capillary: 225 mg/dL — ABNORMAL HIGH (ref 70–99)
Glucose-Capillary: 222 mg/dL — ABNORMAL HIGH (ref 70–99)
Glucose-Capillary: 314 mg/dL — ABNORMAL HIGH (ref 70–99)

## 2021-05-29 LAB — BASIC METABOLIC PANEL
Anion gap: 9 (ref 5–15)
BUN: 34 mg/dL — ABNORMAL HIGH (ref 8–23)
CO2: 35 mmol/L — ABNORMAL HIGH (ref 22–32)
Calcium: 8.9 mg/dL (ref 8.9–10.3)
Chloride: 83 mmol/L — ABNORMAL LOW (ref 98–111)
Creatinine, Ser: 1.04 mg/dL — ABNORMAL HIGH (ref 0.44–1.00)
GFR, Estimated: 55 mL/min — ABNORMAL LOW (ref >60)
Glucose, Bld: 335 mg/dL — ABNORMAL HIGH (ref 70–99)
Potassium: 3.9 mmol/L (ref 3.5–5.1)
Sodium: 127 mmol/L — ABNORMAL LOW (ref 135–145)

## 2021-05-29 LAB — HEMOGLOBIN A1C
Hgb A1c MFr Bld: 10.2 % — ABNORMAL HIGH (ref 4.8–5.6)
Mean Plasma Glucose: 246.04 mg/dL

## 2021-05-29 MED ORDER — SENNOSIDES-DOCUSATE SODIUM 8.6-50 MG PO TABS
2.0000 | ORAL_TABLET | Freq: Two times a day (BID) | ORAL | Status: DC
  Administered 2021-05-29 – 2021-06-02 (×9): 2 via ORAL
  Filled 2021-05-29 (×9): qty 2

## 2021-05-29 MED ORDER — LACTULOSE 10 GM/15ML PO SOLN
20.0000 g | Freq: Once | ORAL | Status: AC
  Administered 2021-05-29: 20 g via ORAL
  Filled 2021-05-29: qty 30

## 2021-05-29 NOTE — Progress Notes (Signed)
?Progress Note ? ? ?Patient: Karen [name] ZOX:[id number] DOB: 12/29/1945 DOA: 05/28/2021     1 ?DOS: the patient was seen and examined on 05/29/2021 ?  ?Brief hospital course: ?Karen [name] is a 76 y.o. female with medical history significant of HTN, diastolic CHF noted to be 60 -65% with grade 1 diastolic dysfunction, COPD on 3 L, DM type II, and anemia presents with complaints of worsening shortness of breath over the last day. ?Patient had a worsening hypoxemia, was placed on 6 L oxygen, then weaned down to 4 L. ?She has elevated BNP, chest x-ray showed moderate interstitial edema.  She is placed on IV Lasix for exacerbation of congestive heart failure. ? ? ?Assessment and Plan: ?* Acute on chronic heart failure with preserved ejection fraction (HFpEF) (Karen [name]) ?Recent echocardiogram in December 2022 showed ejection fraction 60 to 81%, grade 1 diastolic dysfunction and severe pulmonary hypertension.  Patient has significant volume overload with increased short of breath, elevated BNP, checks her x-ray showed interstitial congestion.  We will continue IV Lasix. ? ?Lower abdominal pain ?UA has no evidence of UTI, abdominal cramping is better.  This is probably is due to constipation, will start a stool softener ? ?Hyponatremia ?Sodium is better after giving Lasix.  Continue to follow. ? ?Hypokalemia ?Potassium level is better after giving IV potassium, continue to follow while on diuretics ? ?Severe pulmonary hypertension (Karen [name]) ?Follow-up with pulmonology as outpatient. ? ?Prolonged QT interval ?On admission QTc was noted to be 505. ?Follow electrolytes, avoid medication to further prolong QT interval. ? ?Nausea and vomiting ?Resolved. ? ?Elevated troponin ?Mild elevation troponin secondary to congestive heart failure exacerbation. ? ?Anemia in chronic kidney disease ?Continue to follow. ? ?AF (paroxysmal atrial fibrillation) (Karen [name]) ?Currently in sinus, continue Eliquis. ? ?CKD (chronic kidney disease), stage  IIIa ?Renal function is stable. ? ?Obesity (BMI 35.0-39.9 without comorbidity) ?Patient has morbid obesity with BMI 39.9 with acute on chronic congestive heart failure. ? ?Diabetes mellitus type 2, uncomplicated (Karen [name]) ?Continue scheduled long-acting insulin and sliding scale insulin.  We will make adjustment if needed.  Hemoglobin A1c 10.2 ? ?COPD (chronic obstructive pulmonary disease) (Karen [name]) ?No bronchospasm. ? ?Acute on chronic respiratory failure with hypoxia and hypercapnia (HCC) ?Patient has baseline hypoxemia on 3 L oxygen, increased to 6 L at time of admission with significant shortness of breath.  This is secondary to exacerbation congestive heart failure.  Continue IV Lasix. ? ? ? ? ?  ? ?Subjective:  ?Patient was placed on 6 L oxygen overnight, currently down to 4 L oxygen.  Still short of breath with exertion. ?She does not have any abdominal pain today, feel constipated. ? ?Physical Exam: ?Vitals:  ? 05/29/21 0426 05/29/21 0427 05/29/21 0500 05/29/21 0800  ?BP:    (!) 110/42  ?Pulse: 82 79  71  ?Resp: (!) 22 (!) 24  (!) 23  ?Temp:    98 ?F (36.7 ?C)  ?TempSrc:    Axillary  ?SpO2: 97% 97%  97%  ?Weight:   96 kg   ?Height:      ? ?General exam: Appears calm and comfortable  ?Respiratory system: Coarse breathing sounds. Respiratory effort normal. ?Cardiovascular system: S1 & S2 heard, RRR. No JVD, murmurs, rubs, gallops or clicks.  ?Gastrointestinal system: Abdomen is nondistended, soft and nontender. No organomegaly or masses felt. Normal bowel sounds heard. ?Central nervous system: Alert and oriented. No focal neurological deficits. ?Extremities: 1+ leg edema ?Skin: No rashes, lesions or ulcers ?Psychiatry: Judgement and insight appear normal.  Mood & affect appropriate.  ? ?Data Reviewed: ? ?Prior echocardiogram results ?Chest x-ray ?All lab results. ? ?Family Communication: Not able to reach husband ? ?Disposition: ?Status is: Inpatient ?Remains inpatient appropriate because: Severity of disease, IV  treatment. ? Planned Discharge Destination: Home with Home Health ? ? ? ?Time spent: 35 minutes ? ?Author: ?Sharen Hones, MD ?05/29/2021 12:13 PM ? ?For on call review [email or web address].  ?

## 2021-05-29 NOTE — Progress Notes (Signed)
Patient had significant hypoxia and apnea while taking nap. Instructed respiratory therapist to obtain overnight oximetry and place bipap after documenting hypoxia in order to qualify for outpatient bipap device. She will be placed on bipap tonight. However, patient states that she may not tolerate BIPAP. Told her that she may not be able to survive without a BIPAP.  Will try best to get patient use BIPAP. ?

## 2021-05-29 NOTE — Assessment & Plan Note (Addendum)
Worsening hypoxemia secondary to COPD exacerbation complicated by severe obstructive sleep apnea.  Patient always has worsening hypoxemia at nighttime.  We will try to wean off oxygen, continue CPAP while asleep. ?

## 2021-05-29 NOTE — Evaluation (Signed)
Physical Therapy Evaluation ?Patient Details ?Name: Karen [name] ?MRN: [id number] ?DOB: 14-Feb-1946 ?Today's Date: 05/29/2021 ? ?History of Present Illness ? Pt is a 76 y.o. female with medical history significant of HTN, diastolic CHF, COPD on 3 L, DM type II, and anemia presents with complaints of worsening shortness of breath. MD assessment includes: Acute on chronic heart failure with preserved ejection fraction, hyponatremia, hypokalemia, prolonged QT interval, N&V, elevated troponin secondary to demand ischemia, and anemia of chronic disease. ?  ?Clinical Impression ? Pt was pleasant and motivated to participate during the session and put forth good effort throughout. Pt found on 4LO2/min with SpO2 >/= 90% throughout the session and with HR WNL. Pt reported no pain during the session other than with WB through her RLE where pt reported pain in her "thigh", nsg notified.  Pt leaned heavily on her RW during RLE WB but was able to walk near the EOB and to the chair without LOB. Pt will benefit from HHPT upon discharge to safely address deficits listed in patient problem list for decreased caregiver assistance and eventual return to PLOF. ? ?   ?   ? ?Recommendations for follow up therapy are one component of a multi-disciplinary discharge planning process, led by the attending physician.  Recommendations may be updated based on patient status, additional functional criteria and insurance authorization. ? ?Follow Up Recommendations Home health PT ? ?  ?Assistance Recommended at Discharge Frequent or constant Supervision/Assistance  ?Patient can return home with the following ? A little help with walking and/or transfers;A little help with bathing/dressing/bathroom;Help with stairs or ramp for entrance;Assist for transportation;Assistance with cooking/housework ? ?  ?Equipment Recommendations None recommended by PT  ?Recommendations for Other Services ?    ?  ?Functional Status Assessment Patient has had a recent  decline in their functional status and demonstrates the ability to make significant improvements in function in a reasonable and predictable amount of time.  ? ?  ?Precautions / Restrictions Precautions ?Precautions: Fall ?Restrictions ?Weight Bearing Restrictions: No ?Other Position/Activity Restrictions: HOB > 30 deg  ? ?  ? ?Mobility ? Bed Mobility ?Overal bed mobility: Modified Independent ?  ?  ?  ?  ?  ?  ?General bed mobility comments: Extra time and effort only ?  ? ?Transfers ?Overall transfer level: Needs assistance ?Equipment used: Rolling walker (2 wheels) ?Transfers: Sit to/from Stand ?Sit to Stand: Min guard ?  ?  ?  ?  ?  ?General transfer comment: Min extra time to come to standing but good control and stability ?  ? ?Ambulation/Gait ?Ambulation/Gait assistance: Min guard ?Gait Distance (Feet): 4 Feet ?Assistive device: Rolling walker (2 wheels) ?Gait Pattern/deviations: Step-through pattern, Decreased step length - right, Decreased step length - left, Trunk flexed, Antalgic, Decreased stance time - right ?Gait velocity: decreased ?  ?  ?General Gait Details: Pt able to take several steps at the EOB and from bed to chair limited primarily by new onset of R upper leg pain; pt antalgic on the RLE but steady without LOB ? ?Stairs ?  ?  ?  ?  ?  ? ?Wheelchair Mobility ?  ? ?Modified Rankin (Stroke Patients Only) ?  ? ?  ? ?Balance Overall balance assessment: Needs assistance ?  ?Sitting balance-Leahy Scale: Normal ?  ?  ?Standing balance support: Bilateral upper extremity supported, During functional activity ?Standing balance-Leahy Scale: Good ?  ?  ?  ?  ?  ?  ?  ?  ?  ?  ?  ?  ?   ? ? ? ?  Pertinent Vitals/Pain Pain Assessment ?Pain Assessment: No/denies pain  ? ? ?Home Living Family/patient expects to be discharged to:: Private residence ?Living Arrangements: Spouse/significant other ?Available Help at Discharge: Family;Available 24 hours/day ?Type of Home: Mobile home ?Home Access: Ramped entrance ?   ?  ?  ?Home Layout: One level ?Home Equipment: Grab bars - toilet;Grab bars - tub/shower;Rollator (4 wheels);Rolling Walker (2 wheels);BSC/3in1;Wheelchair - manual ?   ?  ?Prior Function   ?  ?  ?  ?  ?  ?  ?Mobility Comments: Mod Ind amb with a rollator household distances only, w/c in the community, no fall history ?ADLs Comments: Spouse assists with in/out of tub, Ind with all other ADLs ?  ? ? ?Hand Dominance  ? Dominant Hand: Right ? ?  ?Extremity/Trunk Assessment  ? Upper Extremity Assessment ?Upper Extremity Assessment: Generalized weakness ?  ? ?Lower Extremity Assessment ?Lower Extremity Assessment: Generalized weakness ?  ? ?   ?Communication  ? Communication: No difficulties  ?Cognition Arousal/Alertness: Awake/alert ?Behavior During Therapy: [facility] for tasks assessed/performed ?Overall Cognitive Status: Within Functional Limits for tasks assessed ?  ?  ?  ?  ?  ?  ?  ?  ?  ?  ?  ?  ?  ?  ?  ?  ?  ?  ?  ? ?  ?General Comments   ? ?  ?Exercises Total Joint Exercises ?Ankle Circles/Pumps: AROM, Strengthening, Both, 5 reps, 10 reps ?Quad Sets: Strengthening, Both, 5 reps, 10 reps ?Gluteal Sets: Strengthening, 5 reps, Both, 10 reps ?Hip ABduction/ADduction: AROM, Strengthening, Both, 5 reps ?Long Arc Quad: AROM, Strengthening, Both, 5 reps, 10 reps ?Knee Flexion: AROM, Strengthening, Both, 5 reps, 10 reps ?Marching in Standing: AROM, Strengthening, Both, 5 reps, Standing ?Other Exercises ?Other Exercises: HEP education for BLE APs, QS, GS, and LAQs  ? ?Assessment/Plan  ?  ?PT Assessment Patient needs continued PT services  ?PT Problem List Decreased strength;Decreased activity tolerance;Decreased balance;Decreased mobility;Decreased knowledge of use of DME;Pain ? ?   ?  ?PT Treatment Interventions DME instruction;Gait training;Functional mobility training;Therapeutic activities;Therapeutic exercise;Balance training;Patient/family education   ? ?PT Goals (Current goals can be found in the Care Plan section)   ?Acute Rehab PT Goals ?Patient Stated Goal: To get stronger ?PT Goal Formulation: With patient ?Time For Goal Achievement: 06/11/21 ?Potential to Achieve Goals: Fair ? ?  ?Frequency Min 2X/week ?  ? ? ?Co-evaluation   ?  ?  ?  ?  ? ? ?  ?AM-PAC PT "6 Clicks" Mobility  ?Outcome Measure Help needed turning from your back to your side while in a flat bed without using bedrails?: A Little ?Help needed moving from lying on your back to sitting on the side of a flat bed without using bedrails?: A Little ?Help needed moving to and from a bed to a chair (including a wheelchair)?: A Little ?Help needed standing up from a chair using your arms (e.g., wheelchair or bedside chair)?: A Little ?Help needed to walk in hospital room?: A Little ?Help needed climbing 3-5 steps with a railing? : A Lot ?6 Click Score: 17 ? ?  ?End of Session Equipment Utilized During Treatment: Gait belt;Oxygen ?Activity Tolerance: Patient limited by pain ?Patient left: in chair;with call bell/phone within reach;with chair alarm set ?Nurse Communication: Mobility status;Other (comment) (Pt's c/o R thigh pain) ?PT Visit Diagnosis: Difficulty in walking, not elsewhere classified (R26.2);Muscle weakness (generalized) (M62.81);Pain ?Pain - Right/Left: Right ?Pain - part of body: Leg ?  ? ?Time: 0321-2248 ?  PT Time Calculation (min) (ACUTE ONLY): 28 min ? ? ?Charges:   PT Evaluation ?$PT Eval Moderate Complexity: 1 Mod ?PT Treatments ?$Therapeutic Exercise: 8-22 mins ?  ?   ? ?D. Royetta Asal PT, DPT ?05/29/21, 11:10 AM ? ? ? ?

## 2021-05-29 NOTE — Progress Notes (Signed)
Patient having sleep apnea and dropping her sats into the 70s while asleep.  I turned up her 02 to [facility] and notified MD who will order cpap for nighttime and also a nighttime continuous pulse ox.   ?

## 2021-05-29 NOTE — Hospital Course (Addendum)
Karen [name] is a 76 y.o. female with medical history significant of HTN, diastolic CHF noted to be 72 -65% with grade 1 diastolic dysfunction, COPD on 3 L, DM type II, chronic anemia, who presented to the hospital because of worsening shortness of breath of about 1 day duration.  ? ?She was admitted to the hospital for acute exacerbation of chronic diastolic CHF.  She was treated with IV Lasix.  She was found to have significant oxygen desaturation while sleeping and findings strongly suspicious for obstructive sleep apnea.  She was treated with CPAP in the hospital with plans to discharge home on CPAP. ? ? ?

## 2021-05-29 NOTE — Assessment & Plan Note (Addendum)
Significant apnea observed when patient was taking a nap, O2 sats also dropped to 70s.  After a long discussion with the patient and family, patient decided to use CPAP.  She has tolerated well last night.  I will obtain ABG at this time to qualify her for trilogy.  Spoke with the case management, they will prepare trilogy after ABGs  performed. ?

## 2021-05-29 NOTE — Progress Notes (Signed)
NP notified as pt is experiencing considerable O2 desaturation with deep sleep. CCMD notified this nurse that pt O2 was getting as low as 40s with moderate recovery. Pt assessed and NP notified. No new orders at this time. NP is aware.  ?

## 2021-05-29 NOTE — Assessment & Plan Note (Addendum)
This appears to be secondary to severe obstructive sleep apnea.  Patient need to follow-up with pulmonology as outpatient. ?

## 2021-05-30 DIAGNOSIS — G4733 Obstructive sleep apnea (adult) (pediatric): Secondary | ICD-10-CM

## 2021-05-30 DIAGNOSIS — J9622 Acute and chronic respiratory failure with hypercapnia: Secondary | ICD-10-CM

## 2021-05-30 DIAGNOSIS — I5033 Acute on chronic diastolic (congestive) heart failure: Secondary | ICD-10-CM | POA: Diagnosis not present

## 2021-05-30 DIAGNOSIS — J9621 Acute and chronic respiratory failure with hypoxia: Secondary | ICD-10-CM

## 2021-05-30 LAB — BASIC METABOLIC PANEL
Anion gap: 9 (ref 5–15)
BUN: 26 mg/dL — ABNORMAL HIGH (ref 8–23)
CO2: 37 mmol/L — ABNORMAL HIGH (ref 22–32)
Calcium: 9.2 mg/dL (ref 8.9–10.3)
Chloride: 87 mmol/L — ABNORMAL LOW (ref 98–111)
Creatinine, Ser: 0.87 mg/dL (ref 0.44–1.00)
GFR, Estimated: >60 mL/min (ref >60)
Glucose, Bld: 247 mg/dL — ABNORMAL HIGH (ref 70–99)
Potassium: 3.4 mmol/L — ABNORMAL LOW (ref 3.5–5.1)
Sodium: 133 mmol/L — ABNORMAL LOW (ref 135–145)

## 2021-05-30 LAB — GLUCOSE, CAPILLARY
Glucose-Capillary: 239 mg/dL — ABNORMAL HIGH (ref 70–99)
Glucose-Capillary: 432 mg/dL — ABNORMAL HIGH (ref 70–99)
Glucose-Capillary: 208 mg/dL — ABNORMAL HIGH (ref 70–99)
Glucose-Capillary: 165 mg/dL — ABNORMAL HIGH (ref 70–99)
Glucose-Capillary: 146 mg/dL — ABNORMAL HIGH (ref 70–99)

## 2021-05-30 LAB — MAGNESIUM: Magnesium: 1.9 mg/dL (ref 1.7–2.4)

## 2021-05-30 MED ORDER — INSULIN GLARGINE-YFGN 100 UNIT/ML ~~LOC~~ SOLN
30.0000 [IU] | Freq: Two times a day (BID) | SUBCUTANEOUS | Status: DC
  Administered 2021-05-30 – 2021-05-31 (×2): 30 [IU] via SUBCUTANEOUS
  Filled 2021-05-30 (×3): qty 0.3

## 2021-05-30 MED ORDER — POTASSIUM CHLORIDE CRYS ER 20 MEQ PO TBCR
40.0000 meq | EXTENDED_RELEASE_TABLET | Freq: Two times a day (BID) | ORAL | Status: AC
  Administered 2021-05-30 (×2): 40 meq via ORAL
  Filled 2021-05-30 (×2): qty 2

## 2021-05-30 MED ORDER — INSULIN ASPART 100 UNIT/ML IJ SOLN
26.0000 [IU] | Freq: Once | INTRAMUSCULAR | Status: AC
  Administered 2021-05-30: 26 [IU] via SUBCUTANEOUS

## 2021-05-30 MED ORDER — INSULIN GLARGINE-YFGN 100 UNIT/ML ~~LOC~~ SOLN: 50.0000 [IU] | Freq: Every day | SUBCUTANEOUS | Status: DC

## 2021-05-30 NOTE — Assessment & Plan Note (Addendum)
InPatient hemoglobin A1c 10.2.  Glucose still running high, with the insulin glargine to 45 mg every 12 hours, continue scheduled NovoLog to 10 units 3 times a day and a sliding scale insulin. ?

## 2021-05-30 NOTE — Progress Notes (Signed)
?Progress Note ? ? ?Patient: Karen [name] ERX:[id number] DOB: [birth year] DOA: 05/28/2021     2 ?DOS: the patient was seen and examined on 05/30/2021 ?  ?Brief hospital course: ?Karen [name] is a 76 y.o. female with medical history significant of HTN, diastolic CHF noted to be 60 -65% with grade 1 diastolic dysfunction, COPD on 3 L, DM type II, and anemia presents with complaints of worsening shortness of breath over the last day. ?Patient had a worsening hypoxemia, was placed on 6 L oxygen, then weaned down to 4 L. ?She has elevated BNP, chest x-ray showed moderate interstitial edema.  She is placed on IV Lasix for exacerbation of congestive heart failure. ?Patient also was found to have a severe obstructive sleep apnea, she desats significantly while asleep.  But she could not tolerate BiPAP. ? ? ?Assessment and Plan: ?* Acute on chronic heart failure with preserved ejection fraction (HFpEF) ([place]) ?Recent echocardiogram in December 2022 showed ejection fraction 60 to 95%, grade 1 diastolic dysfunction and severe pulmonary hypertension.   ?I will continue 1 more day of IV Lasix, may be able to transition to oral and discharge tomorrow. ? ?Lower abdominal pain ?UA has no evidence of UTI, abdominal cramping is better.  This is probably is due to constipation, had a bowel movement yesterday, no additional abdominal pain. ? ?Hyponatremia ?Improving ? ?Hypokalemia ?Potassium 3.4, will give more potassium today.  Check a BMP tomorrow. ? ?OSA (obstructive sleep apnea) ?Significant apnea observed when patient was taking a nap, O2 sats also dropped to 70s.  I have ordered a BiPAP last night, patient could not tolerate.  Had a long discussion with patient about importance of BiPAP, patient is adamant that she cannot tolerate it. ?At this point, there is no other option.  I will schedule patient to see pulmonology as outpatient, ultimately, patient may need tracheostomy to survive. ? ?Severe pulmonary hypertension  ([place]) ?This appears to be secondary to severe obstructive sleep apnea.  Patient need to follow-up with pulmonology as outpatient. ? ?Prolonged QT interval ?On admission QTc was noted to be 505. ?Follow electrolytes, avoid medication to further prolong QT interval. ? ?Nausea and vomiting ?Resolved. ? ?Elevated troponin ?Mild elevation troponin secondary to congestive heart failure exacerbation. ? ?Anemia in chronic kidney disease ?Hemoglobin stable. ? ?AF (paroxysmal atrial fibrillation) ([place]) ?Continue anticoagulation with Eliquis ? ?CKD (chronic kidney disease), stage IIIa ?Renal function is stable. ? ?Uncontrolled type 2 diabetes mellitus with hyperglycemia, with long-term current use of insulin ([place]) ?Patient hemoglobin A1c 10.2.  Glucose running high up to 432.  Insulin glargine increased to 30 units twice a day, continue sliding scale insulin. ? ?Obesity (BMI 35.0-39.9 without comorbidity) ?Patient has morbid obesity with BMI 39.9 with acute on chronic congestive heart failure. ? ?COPD (chronic obstructive pulmonary disease) ([place]) ?No bronchospasm. ? ?Acute on chronic respiratory failure with hypoxia and hypercapnia ([facility]) ?Worsening hypoxemia secondary to COPD exacerbation complicated by severe obstructive sleep apnea.  Patient always has worsening hypoxemia at nighttime.  Patient could not tolerate BiPAP.  At this point, I will diuresing her, then refer her to pulmonology as outpatient. ? ? ? ? ?  ? ?Subjective:  ?Tried BiPAP last night, patient could not tolerate it, refused to wear it. ?She ended up put on high flow oxygen. ?She feels better today with shortness of breath.  Has no cough. ? ?Physical Exam: ?Vitals:  ? 05/30/21 0330 05/30/21 0335 05/30/21 0802 05/30/21 1202  ?BP:  (!) 114/56 105/85 (!) 138/55  ?  Pulse: 86 83 68 62  ?Resp: (!) 26 (!) 26 20 18   ?Temp:  98.6 ?F (37 ?C) (!) 97.5 ?F (36.4 ?C) 97.8 ?F (36.6 ?C)  ?TempSrc:  Axillary    ?SpO2: 98% 96% 97% 97%  ?Weight:      ?Height:      ? ?General  exam: Appears calm and comfortable,  ?Respiratory system: Decreased breathing sounds. Respiratory effort normal. ?Cardiovascular system: S1 & S2 heard, RRR. No JVD, murmurs, rubs, gallops or clicks. No pedal edema. ?Gastrointestinal system: Abdomen is nondistended, soft and nontender. No organomegaly or masses felt. Normal bowel sounds heard. ?Central nervous system: Alert and oriented. No focal neurological deficits. ?Extremities: Symmetric 5 x 5 power. ?Skin: No rashes, lesions or ulcers ?Psychiatry: Judgement and insight appear normal. Mood & affect appropriate.  ? ?Data Reviewed: ? ?Reviewed all lab results. ? ?Family Communication: husband updated ? ?Disposition: ?Status is: Inpatient ?Remains inpatient appropriate because: severity of disease, iv lasix ? Planned Discharge Destination: Home with Home Health ? ? ? ?Time spent: 28 minutes ? ?Author: ?Sharen Hones, MD ?05/30/2021 2:20 PM ? ?For on call review [email or web address].  ?

## 2021-05-30 NOTE — Consult Note (Addendum)
? ?  Heart Failure Nurse Navigator Note ? ?HFpEF 60-65%.  Grade 1 diastolic dysfunction.  Right ventricular systolic function is mildly reduced.  Severely elevated pulmonary artery systolic pressures.Mild to moderate mitral regurgitation.  Mild aortic stenosis. ? ?She presented to the emergency room with complaints of worsening shortness of breath and weight gain.  Chest x-ray revealed mild to moderate interstitial edema.  BNP 279. ? ?Comorbidities: ? ?Anemia ?Asthma ?COPD with home O2 use at 3 L ?Hyperlipidemia ?Hypertension ?Morbid obesity ?Sleep apnea ? ?Labs: ? ?Sodium 133, potassium 3.4, chloride 87, CO2 37, BUN 26, creatinine 0.87, magnesium 1.9. ?Weight on admission yesterday 96 kg, weight was not performed today. ?Intake 620 mL ?Output 3100 mL ?Blood pressure 105/85 ? ?Medications: ? ?Apixaban 5 mg 2 times a day ?Atorvastatin 10 mg daily ?Furosemide 40 mg IV 2 times a day ?Losartan 25 mg daily ?Potassium chloride 40 mEq 2 times a day ? ? ? ?Initial meeting with patient on this admission.  Husband was not present at the bedside. ? ?Per the nurse report in progression today the patient had asked for 3 cups of water prior to breakfast.  That would be 1081mL, which would be half of her daily allotment of 2000 cc. ? ?I asked the patient how much she drinks at home and she replied "honestly that she really did not know." Recommended getting a container that holds 2 L or 64 ounces and that each time she drank that that amount would be removed from the container.  She voices understanding. ? ?Discussed being compliant with fluid intake, sodium intake and daily weights.  And reporting 2 to 3 pound weight gain overnight or changes in the symptoms. ? ?She also stated that the doctor now wants her to wear BiPAP.  Explained that that would be much better for her health overall and her feeling more rested in the morning and for her heart. ? ?I told her that I would gladly come back and speak with her husband when he was  visiting.  She voices understanding. ? ?She has an appointment in the outpatient heart failure clinic on April 3 at 2 PM. ? ?Was made aware by the patient's nurse that her husband was available.  Discussed low-sodium diet, which she felt they were compliant with they do not use salt at the table he buys low-sodium vegetables. ? ?Also discussed fluid intake for the day he felt that she drinks a couple cups of coffee in the morning with breakfast then may have a glass of milk with lunch and throughout the daytime would drink 4 to 5-16.9 ounces of bottled water, this totals anywhere from 2056mL to 2535 mL.  He then states in the evening she could drink 3-5 more 16.9 ounce bottles which is another 1521 to 2028 mL.  Which is double what her recommended fluid intake is.  Question the why she drinks so much, he states its not that she is thirsty but in the past they have been told when her blood sugars are up that she should be pushing fluids.  Discussed limiting to 2 L / 64 ounces daily, was also given an instruction sheet on ways to quench thirst without increasing fluid intake. ? ? ?Pricilla Riffle RN [facility] ?

## 2021-05-30 NOTE — Progress Notes (Signed)
Auto cpap initiated.  Pt is tolerating it well currently. ?

## 2021-05-30 NOTE — Progress Notes (Signed)
Physical Therapy Treatment ?Patient Details ?Name: Karen [name] ?MRN: [id number] ?DOB: [birth year] ?Today's Date: 05/30/2021 ? ? ?History of Present Illness Pt is a 76 y.o. female with medical history significant of HTN, diastolic CHF, COPD on 3 L, DM type II, and anemia presents with complaints of worsening shortness of breath. MD assessment includes: Acute on chronic heart failure with preserved ejection fraction, hyponatremia, hypokalemia, prolonged QT interval, N&V, elevated troponin secondary to demand ischemia, and anemia of chronic disease. ? ?  ?PT Comments  ? ? Pt was pleasant and motivated to participate during the session and put forth good effort throughout. Pt required no physical assistance during the session and was able to increase her amb distance from 4 feet yesterday to 2 x 10 feet today.  Pt on 3LO2/min with SpO2 and HR WNL throughout.  Of note pt reported no R thigh pain this session.  Pt making good progress towards goals while in acute care and will benefit from HHPT upon discharge to safely address deficits listed in patient problem list for decreased caregiver assistance and eventual return to PLOF. ? ?   ?Recommendations for follow up therapy are one component of a multi-disciplinary discharge planning process, led by the attending physician.  Recommendations may be updated based on patient status, additional functional criteria and insurance authorization. ? ?Follow Up Recommendations ? Home health PT ?  ?  ?Assistance Recommended at Discharge Frequent or constant Supervision/Assistance  ?Patient can return home with the following A little help with walking and/or transfers;A little help with bathing/dressing/bathroom;Help with stairs or ramp for entrance;Assist for transportation;Assistance with cooking/housework ?  ?Equipment Recommendations ? None recommended by PT  ?  ?Recommendations for Other Services   ? ? ?  ?Precautions / Restrictions Precautions ?Precautions:  Fall ?Restrictions ?Weight Bearing Restrictions: No ?Other Position/Activity Restrictions: HOB > 30 deg  ?  ? ?Mobility ? Bed Mobility ?Overal bed mobility: Modified Independent ?  ?  ?  ?  ?  ?  ?General bed mobility comments: Extra time and effort only ?  ? ?Transfers ?Overall transfer level: Needs assistance ?Equipment used: Rolling walker (2 wheels) ?Transfers: Sit to/from Stand ?Sit to Stand: Min guard ?  ?  ?  ?  ?  ?General transfer comment: Min extra time to come to standing but good control and stability ?  ? ?Ambulation/Gait ?Ambulation/Gait assistance: Min guard ?Gait Distance (Feet): 10 Feet ?Assistive device: Rolling walker (2 wheels) ?Gait Pattern/deviations: Step-through pattern, Decreased step length - right, Decreased step length - left, Trunk flexed, Antalgic, Decreased stance time - right ?Gait velocity: decreased ?  ?  ?General Gait Details: Improved activity tolerance this session with pt able to amb 2 x 10 feet wtih seated therapeutic rest break between walks with no R thigh pain this session; SpO2 and HR WNL on 3L ? ? ?Stairs ?  ?  ?  ?  ?  ? ? ?Wheelchair Mobility ?  ? ?Modified Rankin (Stroke Patients Only) ?  ? ? ?  ?Balance Overall balance assessment: Needs assistance ?  ?Sitting balance-Leahy Scale: Normal ?  ?  ?Standing balance support: Bilateral upper extremity supported, During functional activity ?Standing balance-Leahy Scale: Good ?  ?  ?  ?  ?  ?  ?  ?  ?  ?  ?  ?  ?  ? ?  ?Cognition Arousal/Alertness: Awake/alert ?Behavior During Therapy: [facility] for tasks assessed/performed ?Overall Cognitive Status: Within Functional Limits for tasks assessed ?  ?  ?  ?  ?  ?  ?  ?  ?  ?  ?  ?  ?  ?  ?  ?  ?  ?  ?  ? ?  ?  Exercises Total Joint Exercises ?Ankle Circles/Pumps: AROM, Strengthening, Both, 10 reps ?Quad Sets: Strengthening, Both, 10 reps ?Gluteal Sets: Strengthening, Both, 10 reps ?Towel Squeeze: Strengthening, Both, 10 reps ?Long Arc Quad: AROM, Strengthening, Both, 10 reps, 15  reps ?Knee Flexion: AROM, Strengthening, Both, 10 reps, 15 reps ?Marching in Standing: AROM, Strengthening, Both, 5 reps, Standing ?Other Exercises ?Other Exercises: HEP review for BLE APs, QS, GS, and LAQs ? ?  ?General Comments   ?  ?  ? ?Pertinent Vitals/Pain Pain Assessment ?Pain Assessment: No/denies pain  ? ? ?Home Living   ?  ?  ?  ?  ?  ?  ?  ?  ?  ?   ?  ?Prior Function    ?  ?  ?   ? ?PT Goals (current goals can now be found in the care plan section) Progress towards PT goals: Progressing toward goals ? ?  ?Frequency ? ? ? Min 2X/week ? ? ? ?  ?PT Plan Current plan remains appropriate  ? ? ?Co-evaluation   ?  ?  ?  ?  ? ?  ?AM-PAC PT "6 Clicks" Mobility   ?Outcome Measure ? Help needed turning from your back to your side while in a flat bed without using bedrails?: A Little ?Help needed moving from lying on your back to sitting on the side of a flat bed without using bedrails?: A Little ?Help needed moving to and from a bed to a chair (including a wheelchair)?: A Little ?Help needed standing up from a chair using your arms (e.g., wheelchair or bedside chair)?: A Little ?Help needed to walk in hospital room?: A Little ?Help needed climbing 3-5 steps with a railing? : A Little ?6 Click Score: 18 ? ?  ?End of Session Equipment Utilized During Treatment: Gait belt;Oxygen ?Activity Tolerance: Patient tolerated treatment well ?Patient left: in chair;with call bell/phone within reach;with chair alarm set ?Nurse Communication: Mobility status ?PT Visit Diagnosis: Difficulty in walking, not elsewhere classified (R26.2);Muscle weakness (generalized) (M62.81);Pain ?  ? ? ?Time: 7290-2111 ?PT Time Calculation (min) (ACUTE ONLY): 24 min ? ?Charges:  $Gait Training: 8-22 mins ?$Therapeutic Exercise: 8-22 mins          ?          ? ?D. Royetta Asal PT, DPT ?05/30/21, 4:22 PM ? ? ?

## 2021-05-30 NOTE — Progress Notes (Signed)
Mobility Specialist - Progress Note ? ? 05/30/21 1646  ?Mobility  ?Activity Refused mobility  ? ? ? ?Pt beginning PT session at time of attempt. Will attempt another date/time.  ? ? ?Kathee Delton ?Mobility Specialist ?05/30/21, 4:47 PM ? ? ? ? ?

## 2021-05-30 NOTE — Progress Notes (Addendum)
Inpatient Diabetes Program Recommendations ? ?AACE/ADA: New Consensus Statement on Inpatient Glycemic Control  ? ?Target Ranges:  Prepandial:   less than 140 mg/dL ?     Peak postprandial:   less than 180 mg/dL (1-2 hours) ?     Critically ill patients:  140 - 180 mg/dL  ? ? Latest Reference Range & Units 05/29/21 08:13 05/29/21 12:17 05/29/21 16:47 05/29/21 21:28 05/30/21 07:26  ?Glucose-Capillary 70 - 99 mg/dL 271 (H) 225 (H) 222 (H) 314 (H) 239 (H)  ? ? Latest Reference Range & Units 02/01/21 16:27 02/03/21 04:14 05/28/21 16:25  ?Hemoglobin A1C 4.8 - 5.6 % 10.1 (H) 9.8 (H) 10.2 (H)  ? ?Review of Glycemic Control ? ?Diabetes history: DM2 ?Outpatient Diabetes medications: Lantus 45 units daily, Humalog 10 units TID with meals (plus additional units for glucose), Trulicity 0.5 mg Qweek, Metformin 500 mg BID ?Current orders for Inpatient glycemic control: Semglee 32 units daily, Novolog 10 units TID with meals, Novolog 0-6 units TID with meals ? ?Inpatient Diabetes Program Recommendations:   ? ?Insulin: Please consider increasing Semglee to 37 units daily (if agreeable, please also order one time dose of Semglee 5 units x1 now for total of 37 units today), add Novolog 0-5 units QHS for bedtime correction, and increase meal coverage to Novolog 12 units TID with meals. ? ?HbgA1C: A1C 10.2% on 05/28/21 indicating an average glucose of 246 mg/dl over the past 2-3 months. ? ?Outpatient: Encouraged patient to reach out to Dr. Honor Junes to ask about insulin adjustments to get DM under better control. When glucose is high (over 200 mg/dl) patient is drinking excessive water which is effecting CHF. ? ?NOTE: Per chart patient sees Dr. Honor Junes (Endocrinologist) and was last seen 04/11/21. Per office note on 04/11/21 patient is prescribed Trulicity 3 mg Qweek, Lantus 44 units QHS, Metformin 500 mg BID, Humalog 12 units with meals plus 0-6 units for correction.  ? ?Addendum 05/30/21@12 :40-Spoke with patient and her husband at bedside  regarding DM control. CHF Nurse at bedside as well talking with patient about fluid intake with CHF. Patient reports that she is taking DM medications as prescribed by Dr. Honor Junes; taking Lantus 45 units daily, Humalog 10 units TID with meals plus additional units for glucose, Metformin 355 mg BID, Trulicity 0.5 mg Qweek (on Wednesday). Patient's husband reports that patient's CBGs are usually in the 200's fasting, may go down to 180 mg/dl initially in the mornings and then up in the 200's the rest of the day. Patient uses [employer] Libre CGM and currently has one on her left arm. Patient and her husband report they have been told that if her sugar is in the 200's that is good for her. Discussed current A1C 10.2% indicating an average glucose of 246 mg/dl Patient's husband reports that patient drinks a lot of extra water when glucose is over 200 mg/dl because that is what they have been told to do to help glucose. Discussed improving glucose trends so that her glucose was not staying in the 200's mg/dl. Patient has CHF hx and the excessive water she is drinking is impacting fluid status. Patient reports that she just seen Dr. Honor Junes and he is aware of her glucose trends. Encouraged patient and her husband to reach out to Dr. Honor Junes to see if he could provide advice on potential insulin adjustments to get DM under better control so patient is not drinking as much fluid. Patient and her husband verbalized understanding and have no questions at this time.  ? ?  Thanks, ?Barnie Alderman, RN, MSN, CDE ?Diabetes Coordinator ?Inpatient Diabetes Program ?[phone] (Team Pager from 8am to 5pm) ? ? ?

## 2021-05-31 DIAGNOSIS — I5033 Acute on chronic diastolic (congestive) heart failure: Secondary | ICD-10-CM | POA: Diagnosis not present

## 2021-05-31 DIAGNOSIS — J9622 Acute and chronic respiratory failure with hypercapnia: Secondary | ICD-10-CM | POA: Diagnosis not present

## 2021-05-31 DIAGNOSIS — J9621 Acute and chronic respiratory failure with hypoxia: Secondary | ICD-10-CM | POA: Diagnosis not present

## 2021-05-31 DIAGNOSIS — E871 Hypo-osmolality and hyponatremia: Secondary | ICD-10-CM | POA: Diagnosis not present

## 2021-05-31 LAB — BLOOD GAS, ARTERIAL
Acid-Base Excess: 13.3 mmol/L — ABNORMAL HIGH (ref 0.0–2.0)
Bicarbonate: 38.3 mmol/L — ABNORMAL HIGH (ref 20.0–28.0)
FIO2: 32 %
O2 Content: 3 L/min
O2 Saturation: 98 %
Patient temperature: 37
pCO2 arterial: 48 mmHg (ref 32–48)
pH, Arterial: 7.51 — ABNORMAL HIGH (ref 7.35–7.45)
pO2, Arterial: 80 mmHg — ABNORMAL LOW (ref 83–108)

## 2021-05-31 LAB — CBC
HCT: 37.1 % (ref 36.0–46.0)
Hemoglobin: 12 g/dL (ref 12.0–15.0)
MCH: 28.3 pg (ref 26.0–34.0)
MCHC: 32.3 g/dL (ref 30.0–36.0)
MCV: 87.5 fL (ref 80.0–100.0)
Platelets: 203 10*3/uL (ref 150–400)
RBC: 4.24 MIL/uL (ref 3.87–5.11)
RDW: 14.9 % (ref 11.5–15.5)
WBC: 11.2 10*3/uL — ABNORMAL HIGH (ref 4.0–10.5)
nRBC: 0 % (ref 0.0–0.2)

## 2021-05-31 LAB — BASIC METABOLIC PANEL
Anion gap: 9 (ref 5–15)
BUN: 32 mg/dL — ABNORMAL HIGH (ref 8–23)
CO2: 35 mmol/L — ABNORMAL HIGH (ref 22–32)
Calcium: 9.4 mg/dL (ref 8.9–10.3)
Chloride: 89 mmol/L — ABNORMAL LOW (ref 98–111)
Creatinine, Ser: 0.98 mg/dL (ref 0.44–1.00)
GFR, Estimated: 59 mL/min — ABNORMAL LOW (ref >60)
Glucose, Bld: 263 mg/dL — ABNORMAL HIGH (ref 70–99)
Potassium: 4.2 mmol/L (ref 3.5–5.1)
Sodium: 133 mmol/L — ABNORMAL LOW (ref 135–145)

## 2021-05-31 LAB — GLUCOSE, CAPILLARY
Glucose-Capillary: 274 mg/dL — ABNORMAL HIGH (ref 70–99)
Glucose-Capillary: 407 mg/dL — ABNORMAL HIGH (ref 70–99)
Glucose-Capillary: 215 mg/dL — ABNORMAL HIGH (ref 70–99)
Glucose-Capillary: 230 mg/dL — ABNORMAL HIGH (ref 70–99)

## 2021-05-31 MED ORDER — INSULIN ASPART 100 UNIT/ML IJ SOLN
0.0000 [IU] | Freq: Three times a day (TID) | INTRAMUSCULAR | Status: DC
  Administered 2021-05-31: 9 [IU] via SUBCUTANEOUS
  Administered 2021-05-31: 3 [IU] via SUBCUTANEOUS
  Administered 2021-06-01: 5 [IU] via SUBCUTANEOUS
  Administered 2021-06-01: 3 [IU] via SUBCUTANEOUS
  Administered 2021-06-01: 7 [IU] via SUBCUTANEOUS
  Administered 2021-06-02 (×2): 5 [IU] via SUBCUTANEOUS
  Filled 2021-05-31 (×7): qty 1

## 2021-05-31 MED ORDER — INSULIN ASPART 100 UNIT/ML IJ SOLN
6.0000 [IU] | Freq: Once | INTRAMUSCULAR | Status: AC
  Administered 2021-05-31: 6 [IU] via SUBCUTANEOUS
  Filled 2021-05-31: qty 1

## 2021-05-31 MED ORDER — INSULIN GLARGINE-YFGN 100 UNIT/ML ~~LOC~~ SOLN
45.0000 [IU] | Freq: Two times a day (BID) | SUBCUTANEOUS | Status: DC
  Administered 2021-05-31 – 2021-06-02 (×4): 45 [IU] via SUBCUTANEOUS
  Filled 2021-05-31 (×5): qty 0.45

## 2021-05-31 MED ORDER — TORSEMIDE 20 MG PO TABS
40.0000 mg | ORAL_TABLET | Freq: Two times a day (BID) | ORAL | Status: DC
Start: 2021-05-31 — End: 2021-06-02
  Administered 2021-05-31 – 2021-06-02 (×5): 40 mg via ORAL
  Filled 2021-05-31 (×5): qty 2

## 2021-05-31 NOTE — [place] FL2 (Signed)
?[place] MEDICAID FL2 LEVEL OF CARE SCREENING TOOL  ?  ? ?IDENTIFICATION  ?Patient Name: ?Karen [name] Birthdate: [birth year] Sex: female Admission Date (Current Location): ?05/28/2021  ?[place] and [place] Number: ? [place] ?  Facility and Address:  ?[facility], [address] [place], [place] [address] ?     Provider Number: ?[id number]  ?Attending Physician Name and Address:  ?Sharen Hones, MD ? Relative Name and Phone Number:  ?Jeanell Sparrow (spouse) [phone] ?   ?Current Level of Care: ?Hospital Recommended Level of Care: ?[place] Prior Approval Number: ?  ? ?Date Approved/Denied: ?  PASRR Number: ?[id number] A ? ?Discharge Plan: ?SNF ?  ? ?Current Diagnoses: ?Patient Active Problem List  ? Diagnosis Date Noted  ? Severe pulmonary hypertension ([place]) 05/29/2021  ? OSA (obstructive sleep apnea) 05/29/2021  ? Elevated troponin 05/28/2021  ? Lower abdominal pain 05/28/2021  ? Nausea and vomiting 05/28/2021  ? Prolonged QT interval 05/28/2021  ? At risk for fall due to comorbid condition 05/02/2021  ? Pleural effusion   ? Acute lower UTI   ? Acute metabolic encephalopathy   ? Acute kidney injury superimposed on CKD ([place])   ? Impaired gait and mobility 12/20/2020  ? Pneumonia due to COVID-19 virus 10/29/2020  ? Pulmonary nodules/lesions, multiple 10/29/2020  ? Anemia in chronic kidney disease 10/29/2020  ? Aortic stenosis, moderate   ? AF (paroxysmal atrial fibrillation) ([place])   ? Gastroesophageal reflux disease without esophagitis   ? Acute CHF (congestive heart failure) ([place]) 06/17/2020  ? Chronic respiratory failure with hypercapnia ([place]) 05/21/2020  ? Severe sepsis ([place]) 05/21/2020  ? UTI (urinary tract infection) 05/21/2020  ? Hyperglycemia due to type 2 diabetes mellitus ([place]) 05/21/2020  ? Abnormal CT of liver 05/21/2020  ? 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 05/21/2020  ? Class 2 severe obesity with serious comorbidity and body mass index (BMI) of 38.0 to 38.9  in adult [facility]) 05/08/2020  ? Athscl heart disease 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 03/06/2020  ? Acute hip pain, left 10/23/2019  ? Lumbar stenosis with neurogenic claudication 10/23/2019  ? COPD with acute exacerbation ([place]) 09/28/2019  ? Acute diastolic CHF (congestive heart failure) ([place]) 09/28/2019  ? (HFpEF) heart failure with preserved ejection fraction ([place]) 09/27/2019  ? Rectal bleeding 06/28/2019  ? Hypokalemia 06/28/2019  ? Hyponatremia 06/28/2019  ? Uncontrolled type 2 diabetes mellitus with hyperglycemia, with long-term current use of insulin ([place]) 06/28/2019  ? CKD (chronic kidney disease), stage IIIa 06/28/2019  ? Atrial fibrillation, chronic ([place]) 06/28/2019  ? Pulmonary edema 02/27/2019  ? Bradycardia 02/24/2019  ? Acute on chronic diastolic CHF (congestive heart failure) ([place]) 02/23/2019  ? Chronic respiratory failure with hypoxia ([place]) 02/23/2019  ? Atypical chest pain 02/13/2019  ? Osteopenia of neck of left femur 11/20/2018  ? AVM (arteriovenous malformation) of small bowel, acquired   ? Acute gastric ulcer with hemorrhage   ? Chronic diastolic heart failure ([place]) 08/22/2018  ? Diarrhea 08/22/2018  ? Junctional bradycardia   ? Acute on chronic heart failure with preserved ejection fraction (HFpEF) ([place])   ? AKI (acute kidney injury) ([place])   ? Symptomatic bradycardia 08/05/2018  ? Acute on chronic respiratory failure ([place]) 06/25/2018  ? 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 diabetes mellitus ([place]) 01/25/2018  ? History of non anemic vitamin B12 deficiency 01/25/2018  ? Personal history of kidney stones 11/12/2017  ? Urge incontinence 11/12/2017  ? History of leukocytosis 09/17/2017  ? Right  ureteral stone 09/15/2017  ? Acute GI bleeding   ? GI bleed 08/26/2017  ? Arthritis 08/10/2017  ? Stage 4 chronic kidney disease ([place]) 08/10/2017  ? COPD (chronic obstructive pulmonary disease) ([place]) 08/10/2017  ? Diabetes mellitus type 2, uncomplicated ([place]) 50/27/7412  ? Hypertension  08/10/2017  ? Obesity (BMI 35.0-39.9 without comorbidity) 04/11/2017  ? Primary osteoarthritis of right knee 09/01/2016  ? Leucocytosis 10/19/2015  ? Asterixis 01/07/2015  ? Acute on chronic respiratory failure with hypoxia and hypercapnia ([place]) 01/07/2015  ? Sciatica 01/07/2015  ? Weakness 01/07/2015  ? Chronic 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 01/04/2015  ? Iron deficiency anemia 06/22/2014  ? Microalbuminuria 06/22/2014  ? CHF (congestive heart failure) ([place]) 04/20/2014  ? Edema, peripheral 04/20/2014  ? ? ?Orientation RESPIRATION BLADDER Height & Weight   ?  ?Self, Time, Situation, Place ? O2, Other (Comment) (3L nasal cannula, patient will need cpap) Incontinent, External catheter Weight: 206 lb 9.1 oz (93.7 kg) ?Height:  5\' 3"  (160 cm)  ?BEHAVIORAL SYMPTOMS/MOOD NEUROLOGICAL BOWEL NUTRITION STATUS  ?    Continent Diet (see discharge summary)  ?AMBULATORY STATUS COMMUNICATION OF NEEDS Skin   ?Limited Assist Verbally Normal ?  ?  ?  ?    ?     ?     ? ? ?Personal Care Assistance Level of Assistance  ?Bathing, Feeding, Dressing, Total care Bathing Assistance: Limited assistance ?Feeding assistance: Independent ?Dressing Assistance: Limited assistance ?Total Care Assistance: Limited assistance  ? ?Functional Limitations Info  ?Sight, Hearing, Speech Sight Info: Adequate ?Hearing Info: Adequate ?Speech Info: Adequate  ? ? ?SPECIAL CARE FACTORS FREQUENCY  ?PT (By licensed PT), OT (By licensed OT)   ?  ?PT Frequency: min 4x weekly ?OT Frequency: min 4x weekly ?  ?  ?  ?   ? ? ?Contractures Contractures Info: Not present  ? ? ?Additional Factors Info  ?Code Status, Allergies Code Status Info: dnr ?Allergies Info: Ace Inhibitors   Beta Adrenergic Blockers   Gabapentin   Lisinopril   Lyrica (Pregabalin)   Shrimp (Shellfish Allergy) ?  ?  ?  ?   ? ?Current Medications (05/31/2021):  This is the current hospital active medication list ?Current Facility-Administered Medications  ?Medication Dose Route Frequency  Provider Last Rate Last Admin  ? 0.9 %  sodium chloride infusion  250 mL Intravenous PRN Fuller Plan A, MD      ? acetaminophen (TYLENOL) tablet 650 mg  650 mg Oral Q4H PRN Fuller Plan A, MD      ? allopurinol (ZYLOPRIM) tablet 100 mg  100 mg Oral Daily Tamala Julian, Rondell A, MD   100 mg at 05/31/21 0948  ? apixaban (ELIQUIS) tablet 5 mg  5 mg Oral BID Fuller Plan A, MD   5 mg at 05/31/21 0947  ? atorvastatin (LIPITOR) tablet 10 mg  10 mg Oral Daily Fuller Plan A, MD   10 mg at 05/31/21 0947  ? fluticasone (FLONASE) 50 MCG/ACT nasal spray 2 spray  2 spray Each Nare Daily PRN Smith, Rondell A, MD      ? fluticasone furoate-vilanterol (BREO ELLIPTA) 100-25 MCG/ACT 1 puff  1 puff Inhalation Daily Darrick Penna, RPH   1 puff at 05/31/21 1004  ? And  ? umeclidinium bromide (INCRUSE ELLIPTA) 62.5 MCG/ACT 1 puff  1 puff Inhalation Daily Darrick Penna, RPH   1 puff at 05/31/21 1004  ? insulin aspart (novoLOG) injection 0-9 Units  0-9 Units Subcutaneous TID WC Sharen Hones, MD  9 Units at 05/31/21 1211  ? insulin aspart (novoLOG) injection 10 Units  10 Units Subcutaneous TID WC Norval Morton, MD   10 Units at 05/31/21 1212  ? insulin glargine-yfgn (SEMGLEE) injection 45 Units  45 Units Subcutaneous Q12H Sharen Hones, MD      ? ipratropium (ATROVENT) 0.06 % nasal spray 2 spray  2 spray Each Nare QID PRN Fuller Plan A, MD      ? ipratropium-albuterol (DUONEB) 0.5-2.5 (3) MG/3ML nebulizer solution 3 mL  3 mL Nebulization QID PRN Fuller Plan A, MD      ? losartan (COZAAR) tablet 25 mg  25 mg Oral Daily Tamala Julian, Rondell A, MD   25 mg at 05/31/21 0947  ? melatonin tablet 5 mg  5 mg Oral QHS Smith, Rondell A, MD   5 mg at 05/30/21 2105  ? montelukast (SINGULAIR) tablet 10 mg  10 mg Oral QHS Fuller Plan A, MD   10 mg at 05/30/21 2107  ? pantoprazole (PROTONIX) EC tablet 40 mg  40 mg Oral Daily Fuller Plan A, MD   40 mg at 05/31/21 0948  ? senna-docusate (Senokot-S) tablet 2 tablet  2 tablet Oral BID  Sharen Hones, MD   2 tablet at 05/31/21 [phone]  ? sodium chloride flush (NS) 0.9 % injection 3 mL  3 mL Intravenous Q12H Smith, Rondell A, MD   3 mL at 05/31/21 1212  ? sodium chloride flush (NS) 0.9 % injecti

## 2021-05-31 NOTE — Progress Notes (Signed)
Assumed care of pt at 1900. A&O x4. On BL 3 L [place] at beginning of shift and trialing CPAP overnight. Pt tolerated well. Full assessment per flowsheets. Medication administration per MAR. Call bell within reach. Comfort and safety maintained.  ?

## 2021-05-31 NOTE — Progress Notes (Signed)
?   05/30/21 2200  ?BiPAP/CPAP/SIPAP  ?BiPAP/CPAP/SIPAP Pt Type Adult  ?Mask Type Full face mask  ?Mask Size Medium  ?Flow Rate 3 lpm  ?BiPAP/CPAP/SIPAP CPAP  ? ?[place], [place] ?[phone] ? ?

## 2021-05-31 NOTE — Care Management Important Message (Signed)
Important Message ? ?Patient Details  ?Name: Karen [name] ?MRN: [id number] ?Date of Birth: [birth year] ? ? ?Medicare Important Message Given:  Yes ? ? ? ? ?[name] ?05/31/2021, 11:26 AM ?

## 2021-05-31 NOTE — Progress Notes (Signed)
Patient CBG=407 ?MD at nurses station and CBG value verbalized. Orders to give an additional 6 units of novolog entered. ?Patient will receive a total of 15 sliding scale and another 10 units of meal coverage. ?

## 2021-05-31 NOTE — Progress Notes (Signed)
? ?  Heart Failure Nurse Navigator Note ? ?Follow-up  meeting with patient's husband. ? ?Yesterday and discussed the amount of water that she drinks.  He went home and checked the bottles of water that she was drinking, where the 16.9 ounce bottles.  Prior to admission she was drinking anywhere from 8-10 bottles daily in addition to her other liquids. ? ?Today went over with him that if she drinks no other fluids during the day that could have 4 of the 16.9 ounce bottles.  But he says that she always has 2 cups of coffee in the morning with breakfast.  Then  explained to him that he would have to take away 1 of those bottles of water. ? ?He also questions and what to do when she has a high sugars, explained that they become more stringent with a diabetic diet, backing off from eating sweets, etc.  Taking medications as ordered. ? ?Pricilla Riffle RN CHFN ?

## 2021-05-31 NOTE — TOC Initial Note (Signed)
Transition of Care (TOC) - Initial/Assessment Note  ? ? ?Patient Details  ?Name: Karen [name] ?MRN: [id number] ?Date of Birth: 02-14-1946 ? ?Transition of Care (TOC) CM/SW Contact:    ?Alberteen Sam, LCSW ?Phone Number: ?05/31/2021, 2:32 PM ? ?Clinical Narrative:                 ? ?CSW met with patient and husband at bedside, they report wanting patient to go to SNF with preference for [facility] as she's been there in the past. Patient will need bipap at facility.  ? ?[facility] to [facility] pending bed offer.  ? ?Expected Discharge Plan: [place] ?Barriers to Discharge: Continued Medical Work up ? ? ?Patient Goals and CMS Choice ?Patient states their goals for this hospitalization and ongoing recovery are:: to go home ?CMS Medicare.gov Compare Post Acute Care list provided to:: Patient ?Choice offered to / list presented to : Patient ? ?Expected Discharge Plan and Services ?Expected Discharge Plan: [place] ?  ?  ?  ?Living arrangements for the past 2 months: [place] ?                ?  ?  ?  ?  ?  ?  ?  ?  ?  ?  ? ?Prior Living Arrangements/Services ?Living arrangements for the past 2 months: [place] ?Lives with:: Spouse ?  ?       ?  ?  ?  ?  ? ?Activities of Daily Living ?  ?  ? ?Permission Sought/Granted ?  ?  ?   ?   ?   ?   ? ?Emotional Assessment ?  ?  ?  ?Orientation: : Oriented to Self, Oriented to Place, Oriented to  Time, Oriented to Situation ?Alcohol / Substance Use: Not Applicable ?Psych Involvement: No (comment) ? ?Admission diagnosis:  Hypokalemia [E87.6] ?SOB (shortness of breath) [R06.02] ?Acute exacerbation of CHF (congestive heart failure) ([place]) [I50.9] ?Acute on chronic congestive heart failure, unspecified heart failure type ([place]) [I50.9] ?Patient Active Problem List  ? Diagnosis Date Noted  ? Severe pulmonary hypertension ([place]) 05/29/2021  ? OSA (obstructive sleep apnea) 05/29/2021  ? Elevated troponin 05/28/2021  ? Lower abdominal pain  05/28/2021  ? Nausea and vomiting 05/28/2021  ? Prolonged QT interval 05/28/2021  ? At risk for fall due to comorbid condition 05/02/2021  ? Pleural effusion   ? Acute lower UTI   ? Acute metabolic encephalopathy   ? Acute kidney injury superimposed on CKD ([place])   ? Impaired gait and mobility 12/20/2020  ? Pneumonia due to COVID-19 virus 10/29/2020  ? Pulmonary nodules/lesions, multiple 10/29/2020  ? Anemia in chronic kidney disease 10/29/2020  ? Aortic stenosis, moderate   ? AF (paroxysmal atrial fibrillation) ([place])   ? Gastroesophageal reflux disease without esophagitis   ? Acute CHF (congestive heart failure) ([place]) 06/17/2020  ? Chronic respiratory failure with hypercapnia ([place]) 05/21/2020  ? Severe sepsis ([place]) 05/21/2020  ? UTI (urinary tract infection) 05/21/2020  ? Hyperglycemia due to type 2 diabetes mellitus ([place]) 05/21/2020  ? Abnormal CT of liver 05/21/2020  ? 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 05/21/2020  ? Class 2 severe obesity with serious comorbidity and body mass index (BMI) of 38.0 to 38.9 in adult [facility]) 05/08/2020  ? Athscl heart disease 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 03/06/2020  ? Acute hip pain, left 10/23/2019  ? Lumbar stenosis with neurogenic claudication 10/23/2019  ? COPD  with acute exacerbation ([place]) 09/28/2019  ? Acute diastolic CHF (congestive heart failure) ([place]) 09/28/2019  ? (HFpEF) heart failure with preserved ejection fraction ([place]) 09/27/2019  ? Rectal bleeding 06/28/2019  ? Hypokalemia 06/28/2019  ? Hyponatremia 06/28/2019  ? Uncontrolled type 2 diabetes mellitus with hyperglycemia, with long-term current use of insulin ([place]) 06/28/2019  ? CKD (chronic kidney disease), stage IIIa 06/28/2019  ? Atrial fibrillation, chronic ([place]) 06/28/2019  ? Pulmonary edema 02/27/2019  ? Bradycardia 02/24/2019  ? Acute on chronic diastolic CHF (congestive heart failure) ([place]) 02/23/2019  ? Chronic respiratory failure with hypoxia ([place]) 02/23/2019  ? Atypical chest pain 02/13/2019  ?  Osteopenia of neck of left femur 11/20/2018  ? AVM (arteriovenous malformation) of small bowel, acquired   ? Acute gastric ulcer with hemorrhage   ? Chronic diastolic heart failure ([place]) 08/22/2018  ? Diarrhea 08/22/2018  ? Junctional bradycardia   ? Acute on chronic heart failure with preserved ejection fraction (HFpEF) ([place])   ? AKI (acute kidney injury) ([place])   ? Symptomatic bradycardia 08/05/2018  ? Acute on chronic respiratory failure ([place]) 06/25/2018  ? 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 diabetes mellitus ([place]) 01/25/2018  ? History of non anemic vitamin B12 deficiency 01/25/2018  ? Personal history of kidney stones 11/12/2017  ? Urge incontinence 11/12/2017  ? History of leukocytosis 09/17/2017  ? Right ureteral stone 09/15/2017  ? Acute GI bleeding   ? GI bleed 08/26/2017  ? Arthritis 08/10/2017  ? Stage 4 chronic kidney disease ([place]) 08/10/2017  ? COPD (chronic obstructive pulmonary disease) ([place]) 08/10/2017  ? Diabetes mellitus type 2, uncomplicated ([place]) 69/45/0388  ? Hypertension 08/10/2017  ? Obesity (BMI 35.0-39.9 without comorbidity) 04/11/2017  ? Primary osteoarthritis of right knee 09/01/2016  ? Leucocytosis 10/19/2015  ? Asterixis 01/07/2015  ? Acute on chronic respiratory failure with hypoxia and hypercapnia ([place]) 01/07/2015  ? Sciatica 01/07/2015  ? Weakness 01/07/2015  ? Chronic 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 01/04/2015  ? Iron deficiency anemia 06/22/2014  ? Microalbuminuria 06/22/2014  ? CHF (congestive heart failure) ([place]) 04/20/2014  ? Edema, peripheral 04/20/2014  ? ?PCP:  Earlie Counts, FNP ?Pharmacy:   ?[facility] [place] [facility] [address] [facility] ?[place] [address] ?Phone: [phone] Fax: [phone] ? ? ? ? ?Social Determinants of Health (SDOH) Interventions ?  ? ?Readmission Risk Interventions ? ?  10/30/2020  ?  4:17 PM 06/21/2020  ?  3:03 PM 09/30/2019  ? 11:16 AM  ?Readmission Risk Prevention Plan  ?Transportation  Screening Complete Complete Complete  ?PCP or Specialist Appt within 3-5 Days   Complete  ?Social Work Consult for [place] Planning/Counseling   Complete  ?Palliative Care Screening   Not Applicable  ?Medication Review Press photographer) Complete Complete Complete  ?PCP or Specialist appointment within 3-5 days of discharge Complete Complete   ?[place] or Home Care Consult Patient refused Complete   ?SW Recovery Care/Counseling Consult Complete Complete   ?Palliative Care Screening Not Applicable Not Applicable   ?[place] Not Applicable Not Applicable   ? ? ? ?

## 2021-05-31 NOTE — Progress Notes (Signed)
Physical Therapy Treatment ?Patient Details ?Name: Karen [name] ?MRN: [id number] ?DOB: 23-Feb-1946 ?Today's Date: 05/31/2021 ? ? ?History of Present Illness Pt is a 76 y.o. female with medical history significant of HTN, diastolic CHF, COPD on 3 L, DM type II, and anemia presents with complaints of worsening shortness of breath. MD assessment includes: Acute on chronic heart failure with preserved ejection fraction, hyponatremia, hypokalemia, prolonged QT interval, N&V, elevated troponin secondary to demand ischemia, and anemia of chronic disease. ? ?  ?PT Comments  ? ? Pt received seated in recliner upon arrival to room and pt agreeable to therapy.  Pt able to come upright and perform mobility, however with standing, she desaturated to 70% and was increased to 4L, then 6L of supplemental oxygen while waiting on nursing to arrive to room.  Nursing switched the sensor and saturation levels were much improved when placed back on 3L.  Pt still having SOB and with husband in room.  Pt unable to ambulate household distances at this time without support.  Due to lack of caregiver assistance, with the husband being the only person to assist and having medical problems himself, pt is unsafe to d/c home at this time.  Updated recommendations to SNF are listed below.  Pt will continue to benefit from skilled therapy in order to address deficits listed below. ? ?   ?Recommendations for follow up therapy are one component of a multi-disciplinary discharge planning process, led by the attending physician.  Recommendations may be updated based on patient status, additional functional criteria and insurance authorization. ? ?Follow Up Recommendations ? Skilled nursing-short term rehab (<3 hours/day) ?  ?  ?Assistance Recommended at Discharge Frequent or constant Supervision/Assistance  ?Patient can return home with the following A little help with walking and/or transfers;A little help with bathing/dressing/bathroom;Help with  stairs or ramp for entrance;Assist for transportation;Assistance with cooking/housework ?  ?Equipment Recommendations ? None recommended by PT  ?  ?Recommendations for Other Services   ? ? ?  ?Precautions / Restrictions Precautions ?Precautions: Fall ?Restrictions ?Weight Bearing Restrictions: No ?Other Position/Activity Restrictions: HOB > 30 deg  ?  ? ?Mobility ? Bed Mobility ?  ?  ?  ?  ?  ?  ?  ?General bed mobility comments: pt upright in recliner upon arrival ?  ? ?Transfers ?Overall transfer level: Needs assistance ?Equipment used: Rolling walker (2 wheels) ?Transfers: Sit to/from Stand ?Sit to Stand: Min guard ?  ?  ?  ?  ?  ?General transfer comment: Min extra time to come to standing but good control and stability ?  ? ?Ambulation/Gait ?Ambulation/Gait assistance: Min guard ?Gait Distance (Feet): 20 Feet ?Assistive device: Rolling walker (2 wheels) ?Gait Pattern/deviations: Step-through pattern, Decreased step length - right, Decreased step length - left, Trunk flexed, Antalgic, Decreased stance time - right ?Gait velocity: decreased ?  ?  ?General Gait Details: Pt able to ambulate with RW, 2 x 10 feet again with seated rest break.  Pt's O2 desaturated to 68% when in standing and nursing notified.  Changed O2 senser and remained above 90% after. ? ? ?Stairs ?  ?  ?  ?  ?  ? ? ?Wheelchair Mobility ?  ? ?Modified Rankin (Stroke Patients Only) ?  ? ? ?  ?Balance Overall balance assessment: Needs assistance ?  ?Sitting balance-Leahy Scale: Normal ?  ?  ?Standing balance support: Bilateral upper extremity supported, During functional activity ?Standing balance-Leahy Scale: Good ?  ?  ?  ?  ?  ?  ?  ?  ?  ?  ?  ?  ?  ? ?  ?  Cognition Arousal/Alertness: Awake/alert ?Behavior During Therapy: [facility] for tasks assessed/performed ?Overall Cognitive Status: Within Functional Limits for tasks assessed ?  ?  ?  ?  ?  ?  ?  ?  ?  ?  ?  ?  ?  ?  ?  ?  ?  ?  ?  ? ?  ?Exercises   ? ?  ?General Comments   ?  ?  ? ?Pertinent  Vitals/Pain Pain Assessment ?Pain Assessment: No/denies pain  ? ? ?Home Living   ?  ?  ?  ?  ?  ?  ?  ?  ?  ?   ?  ?Prior Function    ?  ?  ?   ? ?PT Goals (current goals can now be found in the care plan section) Acute Rehab PT Goals ?Patient Stated Goal: To get stronger ?PT Goal Formulation: With patient ?Time For Goal Achievement: 06/11/21 ?Potential to Achieve Goals: Fair ?Progress towards PT goals: Progressing toward goals ? ?  ?Frequency ? ? ? Min 2X/week ? ? ? ?  ?PT Plan Discharge plan needs to be updated  ? ? ?Co-evaluation   ?  ?  ?  ?  ? ?  ?AM-PAC PT "6 Clicks" Mobility   ?Outcome Measure ? Help needed turning from your back to your side while in a flat bed without using bedrails?: A Little ?Help needed moving from lying on your back to sitting on the side of a flat bed without using bedrails?: A Little ?Help needed moving to and from a bed to a chair (including a wheelchair)?: A Little ?Help needed standing up from a chair using your arms (e.g., wheelchair or bedside chair)?: A Little ?Help needed to walk in hospital room?: A Little ?Help needed climbing 3-5 steps with a railing? : A Lot ?6 Click Score: 17 ? ?  ?End of Session Equipment Utilized During Treatment: Gait belt;Oxygen ?Activity Tolerance: Patient tolerated treatment well;Patient limited by fatigue ?Patient left: in chair;with call bell/phone within reach;with chair alarm set ?Nurse Communication: Mobility status ?PT Visit Diagnosis: Difficulty in walking, not elsewhere classified (R26.2);Muscle weakness (generalized) (M62.81);Pain ?  ? ? ?Time: 1552-0802 ?PT Time Calculation (min) (ACUTE ONLY): 24 min ? ?Charges:  $Gait Training: 23-37 mins          ?          ? ?Gwenlyn Saran, PT, DPT ?05/31/21, 1:46 PM ? ? ? ?Karen [name] ?05/31/2021, 1:31 PM ? ?

## 2021-05-31 NOTE — Progress Notes (Signed)
?Progress Note ? ? ?Patient: Karen [name] DZH:[id number] DOB: August 26, 1945 DOA: 05/28/2021     3 ?DOS: the patient was seen and examined on 05/31/2021 ?  ?Brief hospital course: ?AVAIYAH STRUBEL is a 76 y.o. female with medical history significant of HTN, diastolic CHF noted to be 60 -65% with grade 1 diastolic dysfunction, COPD on 3 L, DM type II, and anemia presents with complaints of worsening shortness of breath over the last day. ?Patient had a worsening hypoxemia, was placed on 6 L oxygen, then weaned down to 4 L. ?She has elevated BNP, chest x-ray showed moderate interstitial edema.  She is placed on IV Lasix for exacerbation of congestive heart failure. ?Patient also was found to have a severe obstructive sleep apnea, she desats significantly while asleep.  ?She was able to tolerate the CPAP on the night of 3/27.  Social worker to try to set up trilogy before discharge. ? ?Assessment and Plan: ?* Acute on chronic heart failure with preserved ejection fraction (HFpEF) ([place]) ?Recent echocardiogram in December 2022 showed ejection fraction 60 to 41%, grade 1 diastolic dysfunction and severe pulmonary hypertension.   ?Volume status improved, change IV Lasix to oral. ? ?Lower abdominal pain ?UA has no evidence of UTI, abdominal cramping is better.  This is probably is due to constipation, had a bowel movement yesterday, no additional abdominal pain. ? ?Hyponatremia ?Improving ? ?Hypokalemia ?Improved. ? ?OSA (obstructive sleep apnea) ?Significant apnea observed when patient was taking a nap, O2 sats also dropped to 70s.  After a long discussion with the patient and family, patient decided to use CPAP.  She has tolerated well last night.  I will obtain ABG at this time to qualify her for trilogy.  Spoke with the case management, they will prepare trilogy after ABGs  performed. ? ?Severe pulmonary hypertension ([place]) ?This appears to be secondary to severe obstructive sleep apnea.  Patient need to follow-up with  pulmonology as outpatient. ? ?Prolonged QT interval ?On admission QTc was noted to be 505. ?Follow electrolytes, avoid medication that may further prolong QT interval. ? ?Nausea and vomiting ?Resolved. ? ?Elevated troponin ?Mild elevation troponin secondary to congestive heart failure exacerbation. ? ?Anemia in chronic kidney disease ?Hemoglobin stable. ? ?AF (paroxysmal atrial fibrillation) ([place]) ?Continue anticoagulation with Eliquis ? ?CKD (chronic kidney disease), stage IIIa ?Renal function is stable. ? ?Uncontrolled type 2 diabetes mellitus with hyperglycemia, with long-term current use of insulin ([place]) ?InPatient hemoglobin A1c 10.2.  Glucose still running high, with the insulin glargine to 45 mg every 12 hours, continue scheduled NovoLog to 10 units 3 times a day and a sliding scale insulin. ? ?Obesity (BMI 35.0-39.9 without comorbidity) ?Patient has morbid obesity with BMI 39.9 with acute on chronic congestive heart failure. ? ?COPD (chronic obstructive pulmonary disease) ([place]) ?No bronchospasm. ? ?Acute on chronic respiratory failure with hypoxia and hypercapnia (HCC) ?Worsening hypoxemia secondary to COPD exacerbation complicated by severe obstructive sleep apnea.  Patient always has worsening hypoxemia at nighttime.  We will try to wean off oxygen, continue CPAP while asleep. ? ? ? ? ?  ? ?Subjective:  ?Patient was able to wear CPAP last night, she still have significant hypoxia, short of breath seem to be improving. ?No abdominal pain or nausea vomiting. ? ?Physical Exam: ?Vitals:  ? 05/30/21 2103 05/30/21 2333 05/31/21 0440 05/31/21 9622  ?BP: 130/67 (!) 121/52 123/76 (!) 133/43  ?Pulse: 76 71 83 74  ?Resp: 18 16 16 17   ?Temp: 98.1 ?F (36.7 ?C) 97.7 ?  F (36.5 ?C) 98 ?F (36.7 ?C) 97.9 ?F (36.6 ?C)  ?TempSrc: Oral Axillary Axillary Oral  ?SpO2: 98% 95% 97% 96%  ?Weight:   93.7 kg   ?Height:      ? ?General exam: Appears calm and comfortable  ?Respiratory system: Decreased breathing sounds. Respiratory  effort normal. ?Cardiovascular system: Regular. No JVD, murmurs, rubs, gallops or clicks. No pedal edema. ?Gastrointestinal system: Abdomen is nondistended, soft and nontender. No organomegaly or masses felt. Normal bowel sounds heard. ?Central nervous system: Alert and oriented. No focal neurological deficits. ?Extremities: Symmetric 5 x 5 power. ?Skin: No rashes, lesions or ulcers ?Psychiatry: Judgement and insight appear normal. Mood & affect appropriate.  ? ?Data Reviewed: ? ?Lab results reviewed ? ?Family Communication: Husband updated at bedside. ? ?Disposition: ?Status is: Inpatient ?Remains inpatient appropriate because: Severity of disease, ? Planned Discharge Destination: Home with Home Health ? ? ? ?Time spent: 28 minutes ? ?Author: ?Sharen Hones, MD ?05/31/2021 1:45 PM ? ?For on call review [email or web address].  ?

## 2021-06-01 ENCOUNTER — Encounter: Payer: Self-pay | Admitting: Internal Medicine

## 2021-06-01 LAB — GLUCOSE, CAPILLARY
Glucose-Capillary: 206 mg/dL — ABNORMAL HIGH (ref 70–99)
Glucose-Capillary: 446 mg/dL — ABNORMAL HIGH (ref 70–99)
Glucose-Capillary: 322 mg/dL — ABNORMAL HIGH (ref 70–99)
Glucose-Capillary: 281 mg/dL — ABNORMAL HIGH (ref 70–99)

## 2021-06-01 LAB — BASIC METABOLIC PANEL
Anion gap: 9 (ref 5–15)
BUN: 43 mg/dL — ABNORMAL HIGH (ref 8–23)
CO2: 35 mmol/L — ABNORMAL HIGH (ref 22–32)
Calcium: 9.2 mg/dL (ref 8.9–10.3)
Chloride: 88 mmol/L — ABNORMAL LOW (ref 98–111)
Creatinine, Ser: 1.18 mg/dL — ABNORMAL HIGH (ref 0.44–1.00)
GFR, Estimated: 48 mL/min — ABNORMAL LOW (ref >60)
Glucose, Bld: 202 mg/dL — ABNORMAL HIGH (ref 70–99)
Potassium: 4 mmol/L (ref 3.5–5.1)
Sodium: 132 mmol/L — ABNORMAL LOW (ref 135–145)

## 2021-06-01 LAB — MAGNESIUM: Magnesium: 1.9 mg/dL (ref 1.7–2.4)

## 2021-06-01 NOTE — Progress Notes (Signed)
Per Dr. Mal Misty, give 15units total of novolog insulin for CBG of 446.  ?

## 2021-06-01 NOTE — Progress Notes (Signed)
Assumed care of pt at 1900. A&O x4. CPAP on overnight, pt dsatting at times. Respiratory at bedside to assess machine. Full assessment per flowsheets. Medication administration per MAR. Call bell within reach. Comfort and safety maintained.  ?

## 2021-06-01 NOTE — TOC Progression Note (Signed)
Transition of Care (TOC) - Progression Note  ? ? ?Patient Details  ?Name: Karen [name] ?MRN: [id number] ?Date of Birth: 09/10/1945 ? ?Transition of Care (TOC) CM/SW Contact  ?Alberteen Sam, LCSW ?Phone Number: ?06/01/2021, 10:47 AM ? ?Clinical Narrative:    ? ?[facility] accepted patient, CSW has started [facility] insurance auth in [place] portal and per [facility] at [facility] they have ordered CPAP for patient and it will arrive at facility tomorrow.  ? ? ?Expected Discharge Plan: [place] ?Barriers to Discharge: Continued Medical Work up ? ?Expected Discharge Plan and Services ?Expected Discharge Plan: [place] ?  ?  ?  ?Living arrangements for the past 2 months: [place] ?                ?  ?  ?  ?  ?  ?  ?  ?  ?  ?  ? ? ?Social Determinants of Health (SDOH) Interventions ?  ? ?Readmission Risk Interventions ? ?  10/30/2020  ?  4:17 PM 06/21/2020  ?  3:03 PM 09/30/2019  ? 11:16 AM  ?Readmission Risk Prevention Plan  ?Transportation Screening Complete Complete Complete  ?PCP or Specialist Appt within 3-5 Days   Complete  ?Social Work Consult for [place] Planning/Counseling   Complete  ?Palliative Care Screening   Not Applicable  ?Medication Review Press photographer) Complete Complete Complete  ?PCP or Specialist appointment within 3-5 days of discharge Complete Complete   ?Novi or Home Care Consult Patient refused Complete   ?SW Recovery Care/Counseling Consult Complete Complete   ?Palliative Care Screening Not Applicable Not Applicable   ?[place] Not Applicable Not Applicable   ? ? ?

## 2021-06-01 NOTE — Progress Notes (Signed)
Physical Therapy Treatment ?Patient Details ?Name: Karen [name] ?MRN: [id number] ?DOB: Feb 23, 1946 ?Today's Date: 06/01/2021 ? ? ?History of Present Illness Pt is a 76 y.o. female with medical history significant of HTN, diastolic CHF, COPD on 3 L, DM type II, and anemia presents with complaints of worsening shortness of breath. MD assessment includes: Acute on chronic heart failure with preserved ejection fraction, hyponatremia, hypokalemia, prolonged QT interval, N&V, elevated troponin secondary to demand ischemia, and anemia of chronic disease. ? ?  ?PT Comments  ? ? Pt was sitting EOB finishing breakfast upon arriving. She is A and O x 4 and on 3 L throughout session. Pt requested to go to BR for BM however upon standing and ambulating has incontinence episode prior to making it to toilet. Pt is severely deconditioned. She is SOB with very minimal activity and required several rest throughout limited session. Pt will require SNF at DC to address deficits prior to returning home. Recommend SNF to assist pt to PLOF.  ?   ?Recommendations for follow up therapy are one component of a multi-disciplinary discharge planning process, led by the attending physician.  Recommendations may be updated based on patient status, additional functional criteria and insurance authorization. ? ?Follow Up Recommendations ? Skilled nursing-short term rehab (<3 hours/day) ?  ?  ?Assistance Recommended at Discharge Frequent or constant Supervision/Assistance  ?Patient can return home with the following A little help with walking and/or transfers;A little help with bathing/dressing/bathroom;Help with stairs or ramp for entrance;Assist for transportation;Assistance with cooking/housework ?  ?Equipment Recommendations ? None recommended by PT  ?  ?   ?Precautions / Restrictions Precautions ?Precautions: Fall ?Restrictions ?Weight Bearing Restrictions: No  ?  ? ?Mobility ? Bed Mobility ?  ?   ?General bed mobility comments: pt was short  sitting EOB upon arriving ?  ? ?Transfers ?Overall transfer level: Needs assistance ?Equipment used: Rolling walker (2 wheels) ?Transfers: Sit to/from Stand ?Sit to Stand: Min guard ?  ?  ?  ?  ?  ?General transfer comment: CGA for safety for transfers however pt required vcs for improved technique and safety. Poor awareness of deficits and safety. Pt has BM prior to making it to toilet. pt required extensive assistance for hygiene care. poor activity tolerance throughout ?  ? ?Ambulation/Gait ?Ambulation/Gait assistance: Min guard ?Gait Distance (Feet): 10 Feet ?Assistive device: Rolling walker (2 wheels) ?Gait Pattern/deviations: Step-through pattern, Decreased step length - right, Decreased step length - left, Trunk flexed, Antalgic, Decreased stance time - right ?Gait velocity: decreased ?  ?  ?General Gait Details: Pt is extremely deconditioned. get SOB with very minimal activity. Session greatly limited by pt's BM. ? ? ?  ?Balance Overall balance assessment: Needs assistance ?Sitting-balance support: Feet supported ?Sitting balance-Leahy Scale: Normal ?  ?  ?Standing balance support: Bilateral upper extremity supported, During functional activity ?Standing balance-Leahy Scale: Fair ?  ?   ?Cognition Arousal/Alertness: Awake/alert ?Behavior During Therapy: [facility] for tasks assessed/performed ?Overall Cognitive Status: Within Functional Limits for tasks assessed ?  ?   ?General Comments: Pt is A and O x 4 ?  ?  ? ?  ?   ?General Comments General comments (skin integrity, edema, etc.): RN staff notifed that pt's telemetry continues to alarm (brady cardic) even when pt's HR is in 70-80s. ?  ?  ? ?Pertinent Vitals/Pain Pain Assessment ?Pain Assessment: No/denies pain  ? ? ? ?PT Goals (current goals can now be found in the care plan section) Acute Rehab PT Goals ?  Patient Stated Goal: rehab then home ?Progress towards PT goals: Progressing toward goals ? ?  ?Frequency ? ? ? Min 2X/week ? ? ? ?  ?PT Plan Current plan  remains appropriate  ? ? ?   ?AM-PAC PT "6 Clicks" Mobility   ?Outcome Measure ? Help needed turning from your back to your side while in a flat bed without using bedrails?: A Little ?Help needed moving from lying on your back to sitting on the side of a flat bed without using bedrails?: A Little ?Help needed moving to and from a bed to a chair (including a wheelchair)?: A Little ?Help needed standing up from a chair using your arms (e.g., wheelchair or bedside chair)?: A Little ?Help needed to walk in hospital room?: A Little ?Help needed climbing 3-5 steps with a railing? : A Lot ?6 Click Score: 17 ? ?  ?End of Session Equipment Utilized During Treatment: Gait belt;Oxygen ?Activity Tolerance: Patient tolerated treatment well;Patient limited by fatigue ?Patient left: in chair;with call bell/phone within reach;with chair alarm set ?Nurse Communication: Mobility status ?PT Visit Diagnosis: Difficulty in walking, not elsewhere classified (R26.2);Muscle weakness (generalized) (M62.81);Pain ?Pain - Right/Left: Right ?Pain - part of body: Leg ?  ? ? ?Time: 6578-4696 ?PT Time Calculation (min) (ACUTE ONLY): 45 min ? ?Charges:  $Gait Training: 8-22 mins ?$Therapeutic Activity: 23-37 mins          ?          ? ?Julaine Fusi PTA ?06/01/21, 11:01 AM  ? ?

## 2021-06-01 NOTE — Progress Notes (Addendum)
? ? ? ?Progress Note  ? ? ?Karen [name]  [name]:[id number] DOB: 1945/10/18  DOA: 05/28/2021 ?PCP: Earlie Counts, FNP  ? ? ? ? ?Brief Narrative:  ? ? ?Medical records reviewed and are as summarized below: ? ? ? ?Karen [name] is a 76 y.o. female with medical history significant of HTN, diastolic CHF noted to be 69 -65% with grade 1 diastolic dysfunction, COPD on 3 L, DM type II, chronic anemia, who presented to the hospital because of worsening shortness of breath of about 1 day duration.  ? ?She was admitted to the hospital for acute exacerbation of chronic diastolic CHF.  She was treated with IV Lasix.  She was found to have significant oxygen desaturation while sleeping and findings strongly suspicious for obstructive sleep apnea.  She was treated with CPAP in the hospital with plans to discharge home on CPAP. ? ? ? ? ? ? ? ?Assessment/Plan:  ? ?Principal Problem: ?  Acute on chronic heart failure with preserved ejection fraction (HFpEF) ([place]) ?Active Problems: ?  Hypokalemia ?  Hyponatremia ?  Lower abdominal pain ?  Acute on chronic respiratory failure with hypoxia and hypercapnia (HCC) ?  COPD (chronic obstructive pulmonary disease) ([place]) ?  Obesity (BMI 35.0-39.9 without comorbidity) ?  Uncontrolled type 2 diabetes mellitus with hyperglycemia, with long-term current use of insulin ([place]) ?  CKD (chronic kidney disease), stage IIIa ?  AF (paroxysmal atrial fibrillation) ([place]) ?  Anemia in chronic kidney disease ?  Elevated troponin ?  Nausea and vomiting ?  Prolonged QT interval ?  Severe pulmonary hypertension (HCC) ?  OSA (obstructive sleep apnea) ? ? ? ?Body mass index is 36.63 kg/m?.  (Obesity) ? ? ? ?Acute on chronic diastolic CHF: 2D echo in December 2022 showed EF estimated at 60 to 86%, grade 1 diastolic dysfunction and severe pulmonary hypertension.  Continue torsemide. ? ?Probable obstructive sleep apnea: Continue CPAP at night.  Plan to discharge to SNF on CPAP. ? ?Acute on chronic hypoxic  respiratory failure: Oxygen requirement is down to 3 L/min oxygen which is around her baseline.  No evidence of hypercapnia on ABG. ? ?CKD stage IIIa: Creatinine is stable ? ?Paroxysmal atrial fibrillation: Continue Eliquis ? ?Insulin-dependent diabetes mellitus with hyperglycemia: Continue insulin glargine and NovoLog. ? ?COPD: Continue bronchodilators ? ?Hyponatremia, hypokalemia, nausea, vomiting and abdominal pain: Improved ? ? ? ?Diet Order   ? ?       ?  Diet heart healthy/carb modified Room service appropriate? Yes; Fluid consistency: Thin  Diet effective now       ?  ? ?  ?  ? ?  ? ? ? ? ? ? ? ? ?Consultants: ?None ? ?Procedures: ?None ? ? ? ?Medications:  ? ? allopurinol  100 mg Oral Daily  ? apixaban  5 mg Oral BID  ? atorvastatin  10 mg Oral Daily  ? fluticasone furoate-vilanterol  1 puff Inhalation Daily  ? And  ? umeclidinium bromide  1 puff Inhalation Daily  ? insulin aspart  0-9 Units Subcutaneous TID WC  ? insulin aspart  10 Units Subcutaneous TID WC  ? insulin glargine-yfgn  45 Units Subcutaneous Q12H  ? losartan  25 mg Oral Daily  ? melatonin  5 mg Oral QHS  ? montelukast  10 mg Oral QHS  ? pantoprazole  40 mg Oral Daily  ? senna-docusate  2 tablet Oral BID  ? sodium chloride flush  3 mL Intravenous Q12H  ? torsemide  40  mg Oral BID  ? ?Continuous Infusions: ? sodium chloride    ? ? ? ?Anti-infectives (From admission, onward)  ? ? None  ? ?  ? ? ? ? ? ? ? ? ? ?Family Communication/Anticipated D/C date and plan/Code Status  ? ?DVT prophylaxis:  ?apixaban (ELIQUIS) tablet 5 mg  ? ?  Code Status: DNR ? ?Family Communication: None ?Disposition Plan: Plan to discharge to SNF in 1 to 2 days ? ? ?Status is: Inpatient ?Remains inpatient appropriate because: Awaiting placement to SNF ? ? ? ? ? ? ?Subjective:  ? ?Interval events noted.  She complains of shortness of breath. ? ?Objective:  ? ? ?Vitals:  ? 06/01/21 0017 06/01/21 0351 06/01/21 0810 06/01/21 1135  ?BP: (!) 105/52 113/63 135/74 134/71  ?Pulse: 71  78 78 78  ?Resp: 18 18 19 16   ?Temp: 98.3 ?F (36.8 ?C) 98.4 ?F (36.9 ?C) 97.6 ?F (36.4 ?C) 97.7 ?F (36.5 ?C)  ?TempSrc: Oral Axillary    ?SpO2: 90% 98% 98% 99%  ?Weight:  93.8 kg    ?Height:      ? ?No data found. ? ? ?Intake/Output Summary (Last 24 hours) at 06/01/2021 1236 ?Last data filed at 06/01/2021 1020 ?Gross per 24 hour  ?Intake 360 ml  ?Output 1300 ml  ?Net -940 ml  ? ?Filed Weights  ? 05/29/21 0500 05/31/21 0440 06/01/21 0351  ?Weight: 96 kg 93.7 kg 93.8 kg  ? ? ?Exam: ? ?GEN: NAD ?SKIN: Warm and dry ?EYES: No pallor or icterus ?ENT: MMM ?CV: RRR ?PULM: Occasional wheezing but no rales heard ?ABD: soft, obese, NT, +BS ?CNS: AAO x 3, non focal ?EXT: No edema or tenderness ? ? ? ?  ? ? ?Data Reviewed:  ? ?I have personally reviewed following labs and imaging studies: ? ?Labs: ?Labs show the following:  ? ?Basic Metabolic Panel: ?Recent Labs  ?Lab 05/28/21 ?1625 05/29/21 ?5170 05/30/21 ?0174 05/31/21 ?9449 06/01/21 ?0518  ?NA 122* 127* 133* 133* 132*  ?K 2.8* 3.9 3.4* 4.2 4.0  ?CL 80* 83* 87* 89* 88*  ?CO2 31 35* 37* 35* 35*  ?GLUCOSE 257* 335* 247* 263* 202*  ?BUN 35* 34* 26* 32* 43*  ?CREATININE 1.05* 1.04* 0.87 0.98 1.18*  ?CALCIUM 9.1 8.9 9.2 9.4 9.2  ?MG 2.0  --  1.9  --  1.9  ? ?GFR ?Estimated Creatinine Clearance: 43.5 mL/min (A) (by C-G formula based on SCr of 1.18 mg/dL (H)). ?Liver Function Tests: ?Recent Labs  ?Lab 05/28/21 ?1625  ?AST 18  ?ALT 12  ?ALKPHOS 70  ?BILITOT 1.0  ?PROT 7.0  ?ALBUMIN 3.8  ? ?No results for input(s): LIPASE, AMYLASE in the last 168 hours. ?No results for input(s): AMMONIA in the last 168 hours. ?Coagulation profile ?No results for input(s): INR, PROTIME in the last 168 hours. ? ?CBC: ?Recent Labs  ?Lab 05/28/21 ?1625 05/31/21 ?6759  ?WBC 10.4 11.2*  ?HGB 11.3* 12.0  ?HCT 33.4* 37.1  ?MCV 83.7 87.5  ?PLT 198 203  ? ?Cardiac Enzymes: ?No results for input(s): CKTOTAL, CKMB, CKMBINDEX, TROPONINI in the last 168 hours. ?BNP (last 3 results) ?No results for input(s): PROBNP in  the last 8760 hours. ?CBG: ?Recent Labs  ?Lab 05/31/21 ?1638 05/31/21 ?1126 05/31/21 ?1643 05/31/21 ?2129 06/01/21 ?0804  ?GLUCAP 274* 407* 215* 230* 206*  ? ?D-Dimer: ?No results for input(s): DDIMER in the last 72 hours. ?Hgb A1c: ?No results for input(s): HGBA1C in the last 72 hours. ?Lipid Profile: ?No results for input(s): CHOL, HDL, LDLCALC,  TRIG, CHOLHDL, LDLDIRECT in the last 72 hours. ?Thyroid function studies: ?No results for input(s): TSH, T4TOTAL, T3FREE, THYROIDAB in the last 72 hours. ? ?Invalid input(s): FREET3 ?Anemia work up: ?No results for input(s): VITAMINB12, FOLATE, FERRITIN, TIBC, IRON, RETICCTPCT in the last 72 hours. ?Sepsis Labs: ?Recent Labs  ?Lab 05/28/21 ?1625 05/31/21 ?1225  ?WBC 10.4 11.2*  ? ? ?Microbiology ?No results found for this or any previous visit (from the past 240 hour(s)). ? ?Procedures and diagnostic studies: ? ?No results found. ? ? ? ? ? ? ? ? ? ? ? ? LOS: 4 days  ? ?Dazja Houchin  ?[facility]  ? ?Pager on [email or web address]. If 7PM-7AM, please contact night-coverage at [email or web address] ? ? ? ? ?06/01/2021, 12:36 PM  ? ? ? ? ? ? ? ? ? ?

## 2021-06-01 NOTE — Evaluation (Signed)
Occupational Therapy Evaluation ?Patient Details ?Name: Karen [name] ?MRN: [id number] ?DOB: Feb 02, 1946 ?Today's Date: 06/01/2021 ? ? ?History of Present Illness Pt is a 76 y.o. female with medical history significant of HTN, diastolic CHF, COPD on 3 L, DM type II, and anemia presents with complaints of worsening shortness of breath. MD assessment includes: Acute on chronic heart failure with preserved ejection fraction, hyponatremia, hypokalemia, prolonged QT interval, N&V, elevated troponin secondary to demand ischemia, and anemia of chronic disease.  ? ?Clinical Impression ?  ?Pt seen for OT evaluation this date. Prior to admission, pt required assistance from husband for donning/doffing socks/shoes and for tub transfers (independent for all other ADLs), and used rollator for functional mobility. Pt's spouse assists with IADLs at baseline. Pt currently presents with decreased strength and activity tolerance compared to baseline. Due to these functional impairments, pt requires MIN A for bed mobility, MIN GUARD for functional mobility of short household distances (49ft) with RW, MIN A for BSC transfers, and MOD A for sit>stand clothing management/toilet hygiene. Pt endorsed significant fatigue following functional mobility of short distances and toilet transfer. Pt left sitting upright in bed, with all needs within reach. Pt would benefit from additional skilled OT services to maximize return to PLOF and minimize risk of future falls, injury, caregiver burden, and readmission. Upon discharge, recommend SNF.     ? ?Recommendations for follow up therapy are one component of a multi-disciplinary discharge planning process, led by the attending physician.  Recommendations may be updated based on patient status, additional functional criteria and insurance authorization.  ? ?Follow Up Recommendations ? Skilled nursing-short term rehab (<3 hours/day)  ?  ?Assistance Recommended at Discharge Frequent or constant  Supervision/Assistance  ?Patient can return home with the following A little help with walking and/or transfers;A lot of help with bathing/dressing/bathroom;Assistance with cooking/housework ? ?  ?Functional Status Assessment ? Patient has had a recent decline in their functional status and demonstrates the ability to make significant improvements in function in a reasonable and predictable amount of time.  ?Equipment Recommendations ? Other (comment) (defer to next venue of care)  ?  ?   ?Precautions / Restrictions Precautions ?Precautions: Fall ?Restrictions ?Weight Bearing Restrictions: No  ? ?  ? ?Mobility Bed Mobility ?Overal bed mobility: Needs Assistance ?Bed Mobility: Supine to Sit, Sit to Supine ?  ?  ?Supine to sit: Min assist, HOB elevated ?Sit to supine: Supervision, HOB elevated ?  ?General bed mobility comments: Requires MIN A for bring trunk upright during supine>sit. ?  ? ?Transfers ?Overall transfer level: Needs assistance ?Equipment used: Rolling walker (2 wheels) ?Transfers: Sit to/from Stand ?Sit to Stand: Min assist ?  ?  ?  ?  ?  ?General transfer comment: Requires MIN A for upward momentum from EOB and BSC ?  ? ?  ?Balance Overall balance assessment: Needs assistance ?Sitting-balance support: No upper extremity supported, Feet supported ?Sitting balance-Leahy Scale: Fair ?Sitting balance - Comments: Good static sitting balance reachin within BOS, but unable to don socks d/t difficulty maintaining balance when reaching outside BOS ?  ?Standing balance support: Bilateral upper extremity supported, During functional activity ?Standing balance-Leahy Scale: Fair ?Standing balance comment: Requires MIN GUARD for functional mobility of short household distances (60ft) ?  ?  ?  ?  ?  ?  ?  ?  ?  ?  ?  ?   ? ?ADL either performed or assessed with clinical judgement  ? ?ADL Overall ADL's : Needs assistance/impaired ?  ?  ?  Grooming: Wash/dry hands;Wash/dry face;Supervision/safety;Set up;Sitting ?  ?  ?   ?  ?  ?  ?  ?Lower Body Dressing: Maximal assistance;Sitting/lateral leans ?Lower Body Dressing Details (indicate cue type and reason): Requires MAX A for seated LB dressing ?Toilet Transfer: Minimal assistance;BSC/3in1;Rolling walker (2 wheels) ?Toilet Transfer Details (indicate cue type and reason): Requires MIN A for upward momentum ?Toileting- Clothing Manipulation and Hygiene: Moderate assistance;Sit to/from stand ?Toileting - Clothing Manipulation Details (indicate cue type and reason): Able to perform sit>stand peri-care with MIN GUARD but requires MAX A for managing underwearing ?  ?  ?  ?   ? ? ? ?Vision Ability to See in Adequate Light: 0 Adequate ?Patient Visual Report: No change from baseline ?   ?   ?   ?   ? ?Pertinent Vitals/Pain Pain Assessment ?Pain Assessment: No/denies pain  ? ? ? ?Hand Dominance Right ?  ?Extremity/Trunk Assessment Upper Extremity Assessment ?Upper Extremity Assessment: Generalized weakness ?  ?Lower Extremity Assessment ?Lower Extremity Assessment: Generalized weakness ?  ?  ?  ?Communication Communication ?Communication: No difficulties ?  ?Cognition Arousal/Alertness: Awake/alert ?Behavior During Therapy: [facility] for tasks assessed/performed ?Overall Cognitive Status: Within Functional Limits for tasks assessed ?  ?  ?  ?  ?  ?  ?  ?  ?  ?  ?  ?  ?  ?  ?  ?  ?  ?  ?  ?General Comments  Pt's telemetry continues to alarm (brady cardic) even when pt's HR is in 70-80s. ? ?  ?   ?   ? ? ?Home Living Family/patient expects to be discharged to:: Private residence ?Living Arrangements: Spouse/significant other ?Available Help at Discharge: Family;Available 24 hours/day ?Type of Home: Mobile home ?Home Access: Ramped entrance ?  ?  ?Home Layout: One level ?  ?  ?Bathroom Shower/Tub: Tub/shower unit ?  ?Bathroom Toilet: Handicapped height ?  ?  ?Home Equipment: Grab bars - toilet;Grab bars - tub/shower;Rollator (4 wheels);Rolling Walker (2 wheels);BSC/3in1;Wheelchair - manual ?  ?  ?  ? ?   ?Prior Functioning/Environment Prior Level of Function : Needs assist ?  ?  ?  ?Physical Assist : ADLs (physical) ?  ?ADLs (physical): Bathing;Dressing ?Mobility Comments: Mod Ind amb with a rollator household distances only, w/c in the community, no fall history ?ADLs Comments: Spouse assists with in/out of tub and donning socks/shoes, Ind with all other ADLs ?  ? ?  ?  ?OT Problem List: Decreased strength;Decreased activity tolerance;Impaired balance (sitting and/or standing);Decreased knowledge of precautions ?  ?   ?OT Treatment/Interventions: Self-care/ADL training;Energy conservation;DME and/or AE instruction;Therapeutic activities;Patient/family education;Balance training  ?  ?OT Goals(Current goals can be found in the care plan section) Acute Rehab OT Goals ?Patient Stated Goal: to get stronger ?OT Goal Formulation: With patient ?Time For Goal Achievement: 06/15/21 ?Potential to Achieve Goals: Good ?ADL Goals ?Pt Will Perform Grooming: with supervision;standing ?Pt Will Transfer to Toilet: with modified independence;ambulating;bedside commode ?Pt Will Perform Toileting - Clothing Manipulation and hygiene: with supervision;sit to/from stand  ?OT Frequency: Min 2X/week ?  ? ?   ?AM-PAC OT "6 Clicks" Daily Activity     ?Outcome Measure Help from another person eating meals?: None ?Help from another person taking care of personal grooming?: A Little ?Help from another person toileting, which includes using toliet, bedpan, or urinal?: A Lot ?Help from another person bathing (including washing, rinsing, drying)?: A Lot ?Help from another person to put on and taking off regular upper body clothing?:  A Little ?Help from another person to put on and taking off regular lower body clothing?: A Lot ?6 Click Score: 16 ?  ?End of Session Equipment Utilized During Treatment: Rolling walker (2 wheels);Oxygen ?Nurse Communication: Mobility status ? ?Activity Tolerance: Patient tolerated treatment well ?Patient left: in  bed;with call bell/phone within reach;with bed alarm set ? ?OT Visit Diagnosis: Unsteadiness on feet (R26.81);Muscle weakness (generalized) (M62.81)  ?              ?Time: 2549-8264 ?OT Time Calculation (min): 28

## 2021-06-02 LAB — BASIC METABOLIC PANEL
Anion gap: 13 (ref 5–15)
BUN: 50 mg/dL — ABNORMAL HIGH (ref 8–23)
CO2: 33 mmol/L — ABNORMAL HIGH (ref 22–32)
Calcium: 9.1 mg/dL (ref 8.9–10.3)
Chloride: 89 mmol/L — ABNORMAL LOW (ref 98–111)
Creatinine, Ser: 1.28 mg/dL — ABNORMAL HIGH (ref 0.44–1.00)
GFR, Estimated: 43 mL/min — ABNORMAL LOW (ref >60)
Glucose, Bld: 105 mg/dL — ABNORMAL HIGH (ref 70–99)
Potassium: 3.5 mmol/L (ref 3.5–5.1)
Sodium: 135 mmol/L (ref 135–145)

## 2021-06-02 LAB — GLUCOSE, CAPILLARY
Glucose-Capillary: 118 mg/dL — ABNORMAL HIGH (ref 70–99)
Glucose-Capillary: 269 mg/dL — ABNORMAL HIGH (ref 70–99)
Glucose-Capillary: 259 mg/dL — ABNORMAL HIGH (ref 70–99)

## 2021-06-02 LAB — RESP PANEL BY RT-PCR (FLU A&B, COVID) ARPGX2
Influenza A by PCR: NEGATIVE
Influenza B by PCR: NEGATIVE
SARS Coronavirus 2 by RT PCR: NEGATIVE

## 2021-06-02 MED ORDER — LANTUS SOLOSTAR 100 UNIT/ML ~~LOC~~ SOPN: 45.0000 [IU] | PEN_INJECTOR | Freq: Every day | SUBCUTANEOUS | Status: DC

## 2021-06-02 NOTE — TOC Transition Note (Signed)
Transition of Care (TOC) - CM/SW Discharge Note ? ? ?Patient Details  ?Name: Karen [name] ?MRN: [id number] ?Date of Birth: 1945/12/23 ? ?Transition of Care (TOC) CM/SW Contact:  ?Candie Chroman, LCSW ?Phone Number: ?06/02/2021, 4:16 PM ? ? ?Clinical Narrative:   Patient has orders to discharge to [facility] SNF today. RN will call report to [phone] (Room E5). EMS transport has been arranged and she is 2nd on the list. No further concerns. CSW signing off. ? ?Final next level of care: [place] ?Barriers to Discharge: Barriers Resolved ? ? ?Patient Goals and CMS Choice ?Patient states their goals for this hospitalization and ongoing recovery are:: to go home ?CMS Medicare.gov Compare Post Acute Care list provided to:: Patient ?Choice offered to / list presented to : Patient ? ?Discharge Placement ?  ?Existing PASRR number confirmed : 05/31/21          ?Patient chooses bed at: Other - please specify in the comment section below: ([facility]) ?Patient to be transferred to facility by: EMS ?Name of family member notified: Patient said family already aware ?Patient and family notified of of transfer: 06/02/21 ? ?Discharge Plan and Services ?  ?  ?           ?  ?  ?  ?  ?  ?  ?  ?  ?  ?  ? ?Social Determinants of Health (SDOH) Interventions ?  ? ? ?Readmission Risk Interventions ? ?  10/30/2020  ?  4:17 PM 06/21/2020  ?  3:03 PM 09/30/2019  ? 11:16 AM  ?Readmission Risk Prevention Plan  ?Transportation Screening Complete Complete Complete  ?PCP or Specialist Appt within 3-5 Days   Complete  ?Social Work Consult for [place] Planning/Counseling   Complete  ?Palliative Care Screening   Not Applicable  ?Medication Review Press photographer) Complete Complete Complete  ?PCP or Specialist appointment within 3-5 days of discharge Complete Complete   ?[place] or Home Care Consult Patient refused Complete   ?SW Recovery Care/Counseling Consult Complete Complete   ?Palliative Care Screening Not  Applicable Not Applicable   ?[place] Not Applicable Not Applicable   ? ? ? ? ? ?

## 2021-06-02 NOTE — Plan of Care (Signed)
  Problem: Education: Goal: Individualized Educational Video(s) Outcome: Progressing   Problem: Activity: Goal: Capacity to carry out activities will improve Outcome: Progressing   Problem: Cardiac: Goal: Ability to achieve and maintain adequate cardiopulmonary perfusion will improve Outcome: Progressing   

## 2021-06-02 NOTE — Progress Notes (Signed)
[facility]) Hospital Liaison note: ? ?This patient is currently enrolled in [facility] outpatient-based Palliative Care. Will continue to follow for disposition. ? ?Please call with any outpatient palliative questions or concerns. ? ?Thank you, ?Lorelee Market, LPN ?[facility] Hospital Liaison ?[phone] ?

## 2021-06-02 NOTE — TOC Progression Note (Addendum)
Transition of Care (TOC) - Progression Note  ? ? ?Patient Details  ?Name: Karen [name] ?MRN: [id number] ?Date of Birth: 1946-01-12 ? ?Transition of Care (TOC) CM/SW Contact  ?Candie Chroman, LCSW ?Phone Number: ?06/02/2021, 12:03 PM ? ?Clinical Narrative:  Insurance authorization approved: [id number]. Reference # [id number]. Valid 3/30-4/3. [facility] admissions coordinator is confirming that CPAP was delivered.  ? ?12:48 pm: SNF needs CPAP settings in an order or note. Sent secure chat to MD to notify. ? ?Expected Discharge Plan: [place] ?Barriers to Discharge: Continued Medical Work up ? ?Expected Discharge Plan and Services ?Expected Discharge Plan: [place] ?  ?  ?  ?Living arrangements for the past 2 months: [place] ?                ?  ?  ?  ?  ?  ?  ?  ?  ?  ?  ? ? ?Social Determinants of Health (SDOH) Interventions ?  ? ?Readmission Risk Interventions ? ?  10/30/2020  ?  4:17 PM 06/21/2020  ?  3:03 PM 09/30/2019  ? 11:16 AM  ?Readmission Risk Prevention Plan  ?Transportation Screening Complete Complete Complete  ?PCP or Specialist Appt within 3-5 Days   Complete  ?Social Work Consult for [place] Planning/Counseling   Complete  ?Palliative Care Screening   Not Applicable  ?Medication Review Press photographer) Complete Complete Complete  ?PCP or Specialist appointment within 3-5 days of discharge Complete Complete   ?[place] or Home Care Consult Patient refused Complete   ?SW Recovery Care/Counseling Consult Complete Complete   ?Palliative Care Screening Not Applicable Not Applicable   ?[place] Not Applicable Not Applicable   ? ? ?

## 2021-06-02 NOTE — Progress Notes (Signed)
Report called to Lavella Lemons, Therapist, sports at [facility] [employer]. Awaiting EMS for transport. ?

## 2021-06-02 NOTE — Progress Notes (Signed)
Inpatient Diabetes Program Recommendations ? ?AACE/ADA: New Consensus Statement on Inpatient Glycemic Control (2015) ? ?Target Ranges:  Prepandial:   less than 140 mg/dL ?     Peak postprandial:   less than 180 mg/dL (1-2 hours) ?     Critically ill patients:  140 - 180 mg/dL  ? ? Latest Reference Range & Units 06/01/21 08:04 06/01/21 13:10 06/01/21 16:17 06/01/21 19:58  ?Glucose-Capillary 70 - 99 mg/dL 206 (H) ? ?13 units Novolog ? ?45 units Semglee @10am  446 (H) ? ?15 units Novolog 322 (H) ? ?17 units Novolog ? 281 (H) ? ? ? ? ?45 units Semglee @10pm   ?(H): Data is abnormally high ? Latest Reference Range & Units 06/02/21 08:29 06/02/21 11:19  ?Glucose-Capillary 70 - 99 mg/dL 118 (H) ? ?10 units Novolog ? ?45 units Semglee ? 269 (H) ? ?15 units Novolog ?  ?(H): Data is abnormally high ? ? ?Home DM Meds: Lantus 45 units daily ?    Humalog 10 units TID with meals (plus additional units for glucose) ?    Trulicity 0.5 mg Qweek ?    Metformin 500 mg BID ? ? ?Current Orders: Semglee 45 units BID  ?Novolog 0-9 units TID  ?Novolog 10 units TID with meals ? ? ? ? ?MD- Note AM CBG improved this AM, however, CBG up to 269 pre-lunch. ? ?Please consider further increasing the Novolog Meal Coverage to 12 units TID ? ? ? ?--Will follow patient during hospitalization-- ? ?Wyn Quaker RN, MSN, CDE ?Diabetes Coordinator ?Inpatient Glycemic Control Team ?Team Pager: [phone] (8a-5p) ? ?

## 2021-06-02 NOTE — Progress Notes (Signed)
PT Cancellation Note ? ?Patient Details ?Name: Karen [name] ?MRN: [id number] ?DOB: 1945-03-21 ? ? ?Cancelled Treatment:    Reason Eval/Treat Not Completed: Other (comment).  Chart reviewed and attempted to see pt.  Pt noting that she is actively getting d/c and confirmed by TOC via secure chat.  Pt will continue with therapy during hospital stay.  ? ? ?Gwenlyn Saran, PT, DPT ?06/02/21, 3:31 PM ? ?

## 2021-06-02 NOTE — Discharge Summary (Signed)
? ?Physician Discharge Summary  ?[name] SNK:[id number] DOB: [birth year] DOA: 05/28/2021 ? ?PCP: Earlie Counts, FNP ? ?Admit date: 05/28/2021 ?Discharge date: 06/02/2021 ? ?Discharge disposition: SNF ? ? ?Recommendations for Outpatient Follow-Up:  ? ?Outpatient follow-up with pulmonologist for sleep study. ?Follow-up with physician at the within 3 days of discharge. ?Follow-up with PCP within 2 weeks of discharge ? ? ?Discharge Diagnosis:  ? ?Principal Problem: ?  Acute on chronic heart failure with preserved ejection fraction (HFpEF) ([place]) ?Active Problems: ?  Hypokalemia ?  Hyponatremia ?  Lower abdominal pain ?  Acute on chronic respiratory failure with hypoxia and hypercapnia (HCC) ?  COPD (chronic obstructive pulmonary disease) ([place]) ?  Obesity (BMI 35.0-39.9 without comorbidity) ?  Uncontrolled type 2 diabetes mellitus with hyperglycemia, with long-term current use of insulin ([place]) ?  CKD (chronic kidney disease), stage IIIa ?  AF (paroxysmal atrial fibrillation) ([place]) ?  Anemia in chronic kidney disease ?  Elevated troponin ?  Nausea and vomiting ?  Prolonged QT interval ?  Severe pulmonary hypertension (HCC) ?  OSA (obstructive sleep apnea) ? ? ? ?Discharge Condition: Stable. ? ?Diet recommendation:  ?Diet Order   ? ?       ?  Diet - low sodium heart healthy       ?  ?  Diet Carb Modified       ?  ?  Diet heart healthy/carb modified Room service appropriate? Yes; Fluid consistency: Thin  Diet effective now       ?  ? ?  ?  ? ?  ? ? ?  Code Status: DNR ? ? ? ? ?Hospital Course:  ? ?  ?[name] is a 76 y.o. female with medical history significant of HTN, diastolic CHF noted to be 63 -65% with grade 1 diastolic dysfunction, COPD, chronic hypoxic respiratory failure on 3 L, DM type II, paroxysmal atrial fibrillation on Eliquis, chronic anemia, who presented to the hospital because of worsening shortness of breath of about 1 day duration.  ?  ?She was admitted to the hospital for acute  exacerbation of chronic diastolic CHF.  She was treated with IV Lasix.  She was found to have significant oxygen desaturation while sleeping and findings strongly suspicious for obstructive sleep apnea.  She was treated with Auto PAP in the hospital.  She will be discharged on Auto PAP/ CPAP with settings of 5 to 20 cm H20.  Outpatient follow-up with pulmonologist for sleep study strongly recommended. ? ?She had nausea, vomiting, abdominal pain, hyponatremia and hypokalemia all of which have improved.  She is feeling better and she is deemed stable for discharge to SNF today. ?  ? ? ? ? ? ?Discharge Exam:  ? ? ?Vitals:  ? 06/02/21 0358 06/02/21 0500 06/02/21 0845 06/02/21 1257  ?BP: (!) 107/53  113/63 (!) 111/57  ?Pulse: 72  72 69  ?Resp: 19  19 18   ?Temp: 98.6 ?F (37 ?C)  (!) 97.5 ?F (36.4 ?C) 97.7 ?F (36.5 ?C)  ?TempSrc: Axillary  Oral   ?SpO2: 96%  100% 97%  ?Weight:  94.4 kg    ?Height:      ? ? ? ?GEN: NAD ?SKIN: Warm and dry ?EYES: No pallor or icterus ?ENT: MMM ?CV: RRR ?PULM: Adequate air entry bilaterally, no wheezing or rales heard ?ABD: soft, obese, NT, +BS ?CNS: AAO x 3, non focal ?EXT: No edema or tenderness ? ? ?The results of significant diagnostics from this hospitalization (including  imaging, microbiology, ancillary and laboratory) are listed below for reference.   ? ? ?Procedures and Diagnostic Studies:  ? ?DG Chest Portable 1 View ? ?Result Date: 05/28/2021 ?CLINICAL DATA:  Shortness of breath. EXAM: PORTABLE CHEST 1 VIEW COMPARISON:  February 07, 2021 FINDINGS: Mild to moderate severity diffusely increased interstitial lung markings are seen. There is no evidence of focal consolidation, pleural effusion or pneumothorax. The cardiac silhouette is mildly enlarged and unchanged in size. There is moderate severity calcification of the aortic arch. Multilevel degenerative changes seen throughout the thoracic spine. IMPRESSION: Stable cardiomegaly with mild to moderate severity interstitial edema.  Electronically Signed   By: Virgina Norfolk M.D.   On: 05/28/2021 16:36  ? ? ? ?Labs:  ? ?Basic Metabolic Panel: ?Recent Labs  ?Lab 05/28/21 ?1625 05/29/21 ?4562 05/30/21 ?5638 05/31/21 ?9373 06/01/21 ?0518 06/02/21 ?4287  ?NA 122* 127* 133* 133* 132* 135  ?K 2.8* 3.9 3.4* 4.2 4.0 3.5  ?CL 80* 83* 87* 89* 88* 89*  ?CO2 31 35* 37* 35* 35* 33*  ?GLUCOSE 257* 335* 247* 263* 202* 105*  ?BUN 35* 34* 26* 32* 43* 50*  ?CREATININE 1.05* 1.04* 0.87 0.98 1.18* 1.28*  ?CALCIUM 9.1 8.9 9.2 9.4 9.2 9.1  ?MG 2.0  --  1.9  --  1.9  --   ? ?GFR ?Estimated Creatinine Clearance: 40.2 mL/min (A) (by C-G formula based on SCr of 1.28 mg/dL (H)). ?Liver Function Tests: ?Recent Labs  ?Lab 05/28/21 ?1625  ?AST 18  ?ALT 12  ?ALKPHOS 70  ?BILITOT 1.0  ?PROT 7.0  ?ALBUMIN 3.8  ? ?No results for input(s): LIPASE, AMYLASE in the last 168 hours. ?No results for input(s): AMMONIA in the last 168 hours. ?Coagulation profile ?No results for input(s): INR, PROTIME in the last 168 hours. ? ?CBC: ?Recent Labs  ?Lab 05/28/21 ?1625 05/31/21 ?6811  ?WBC 10.4 11.2*  ?HGB 11.3* 12.0  ?HCT 33.4* 37.1  ?MCV 83.7 87.5  ?PLT 198 203  ? ?Cardiac Enzymes: ?No results for input(s): CKTOTAL, CKMB, CKMBINDEX, TROPONINI in the last 168 hours. ?BNP: ?Invalid input(s): POCBNP ?CBG: ?Recent Labs  ?Lab 06/01/21 ?1310 06/01/21 ?1617 06/01/21 ?1958 06/02/21 ?0829 06/02/21 ?1119  ?GLUCAP 446* 322* 281* 118* 269*  ? ?D-Dimer ?No results for input(s): DDIMER in the last 72 hours. ?Hgb A1c ?No results for input(s): HGBA1C in the last 72 hours. ?Lipid Profile ?No results for input(s): CHOL, HDL, LDLCALC, TRIG, CHOLHDL, LDLDIRECT in the last 72 hours. ?Thyroid function studies ?No results for input(s): TSH, T4TOTAL, T3FREE, THYROIDAB in the last 72 hours. ? ?Invalid input(s): FREET3 ?Anemia work up ?No results for input(s): VITAMINB12, FOLATE, FERRITIN, TIBC, IRON, RETICCTPCT in the last 72 hours. ?Microbiology ?No results found for this or any previous visit (from the past  240 hour(s)). ? ? ?Discharge Instructions:  ? ?Discharge Instructions   ? ? Diet - low sodium heart healthy   Complete by: As directed ?  ? Diet Carb Modified   Complete by: As directed ?  ? Discharge instructions   Complete by: As directed ?  ? Use CPAP at night and during sleep. CPAP/ Auto PAP settings: 5-20 cm H20  ? Increase activity slowly   Complete by: As directed ?  ? ?  ? ?Allergies as of 06/02/2021   ? ?   Reactions  ? Ace Inhibitors Hives  ? Beta Adrenergic Blockers   ? Junctional bradycardia  ? Gabapentin Hives  ? Lisinopril Hives  ? Lyrica [pregabalin] Hives  ? Shrimp [shellfish Allergy]  Swelling  ? Swelling of the lips  ? ?  ? ?  ?Medication List  ?  ? ?STOP taking these medications   ? ?hydrochlorothiazide 25 MG tablet ?Commonly known as: HYDRODIURIL ?  ? ?  ? ?TAKE these medications   ? ?acetaminophen 500 MG tablet ?Commonly known as: TYLENOL ?Take 1-2 tablets (500-1,000 mg total) by mouth every 6 (six) hours as needed for mild pain, fever, moderate pain or headache. Do not take more than 4 grams a day ?  ?albuterol 108 (90 Base) MCG/ACT inhaler ?Commonly known as: VENTOLIN HFA ?Inhale 2 puffs into the lungs every 6 (six) hours as needed for wheezing or shortness of breath. ?  ?allopurinol 100 MG tablet ?Commonly known as: ZYLOPRIM ?Take 1 tablet by mouth daily. ?  ?atorvastatin 10 MG tablet ?Commonly known as: LIPITOR ?Take 1 tablet by mouth daily. ?  ?Dulaglutide 3 MG/0.5ML Sopn ?Inject 0.5 mLs into the skin once a week. ?  ?Eliquis 5 MG Tabs tablet ?Generic drug: apixaban ?Take 1 tablet by mouth twice daily ?  ?esomeprazole 40 MG capsule ?Commonly known as: Redan ?Take 40 mg by mouth daily. ?  ?fluticasone 50 MCG/ACT nasal spray ?Commonly known as: FLONASE ?Place 2 sprays into both nostrils daily. ?  ?Fluticasone-Umeclidin-Vilant 100-62.5-25 MCG/INH Aepb ?Inhale 1 puff into the lungs daily. ?  ?furosemide 20 MG tablet ?Commonly known as: Lasix ?Take 2 tablets (40 mg total) by mouth 2 (two) times  daily. ?  ?insulin lispro 100 UNIT/ML KwikPen ?Commonly known as: HUMALOG ?Inject 10 Units into the skin in the morning, at noon, and at bedtime. 10 units with meals plus additional units for correction ?  ?ipratropium

## 2021-06-02 NOTE — Progress Notes (Signed)
[name] to be D/C'd Skilled nursing facility [facility])  per MD order. Report called to [place] at [place]. Husband aware of d/c. ? ?Allergies as of 06/02/2021   ? ?   Reactions  ? Ace Inhibitors Hives  ? Beta Adrenergic Blockers   ? Junctional bradycardia  ? Gabapentin Hives  ? Lisinopril Hives  ? Lyrica [pregabalin] Hives  ? Shrimp [shellfish Allergy] Swelling  ? Swelling of the lips  ? ?  ? ?  ?Medication List  ?  ? ?STOP taking these medications   ? ?hydrochlorothiazide 25 MG tablet ?Commonly known as: HYDRODIURIL ?  ? ?  ? ?TAKE these medications   ? ?acetaminophen 500 MG tablet ?Commonly known as: TYLENOL ?Take 1-2 tablets (500-1,000 mg total) by mouth every 6 (six) hours as needed for mild pain, fever, moderate pain or headache. Do not take more than 4 grams a day ?  ?albuterol 108 (90 Base) MCG/ACT inhaler ?Commonly known as: VENTOLIN HFA ?Inhale 2 puffs into the lungs every 6 (six) hours as needed for wheezing or shortness of breath. ?  ?allopurinol 100 MG tablet ?Commonly known as: ZYLOPRIM ?Take 1 tablet by mouth daily. ?  ?atorvastatin 10 MG tablet ?Commonly known as: LIPITOR ?Take 1 tablet by mouth daily. ?  ?Dulaglutide 3 MG/0.5ML Sopn ?Inject 0.5 mLs into the skin once a week. ?  ?Eliquis 5 MG Tabs tablet ?Generic drug: apixaban ?Take 1 tablet by mouth twice daily ?  ?esomeprazole 40 MG capsule ?Commonly known as: [place] ?Take 40 mg by mouth daily. ?  ?fluticasone 50 MCG/ACT nasal spray ?Commonly known as: FLONASE ?Place 2 sprays into both nostrils daily. ?  ?Fluticasone-Umeclidin-Vilant 100-62.5-25 MCG/INH Aepb ?Inhale 1 puff into the lungs daily. ?  ?furosemide 20 MG tablet ?Commonly known as: Lasix ?Take 2 tablets (40 mg total) by mouth 2 (two) times daily. ?  ?insulin lispro 100 UNIT/ML KwikPen ?Commonly known as: HUMALOG ?Inject 10 Units into the skin in the morning, at noon, and at bedtime. 10 units with meals plus additional units for correction ?  ?ipratropium 0.06 % nasal  spray ?Commonly known as: ATROVENT ?Place 2 sprays into both nostrils 4 (four) times daily. ?  ?ipratropium-albuterol 0.5-2.5 (3) MG/3ML Soln ?Commonly known as: DUONEB ?Take 3 mLs by nebulization 4 (four) times daily as needed. ?  ?Iron 325 (65 Fe) MG Tabs ?Take 1 tablet (325 mg total) by mouth daily. ?  ?Lantus SoloStar 100 UNIT/ML Solostar Pen ?Generic drug: insulin glargine ?Inject 45 Units into the skin daily. ?Start taking on: June 03, 2021 ?  ?losartan 50 MG tablet ?Commonly known as: COZAAR ?Take 25 mg by mouth daily. ?  ?melatonin 5 MG Tabs ?Take 5 mg by mouth at bedtime. ?  ?metFORMIN 500 MG tablet ?Commonly known as: GLUCOPHAGE ?Take 500 mg by mouth 2 (two) times daily. ?  ?montelukast 10 MG tablet ?Commonly known as: SINGULAIR ?Take 10 mg by mouth at bedtime. ?  ?senna-docusate 8.6-50 MG tablet ?Commonly known as: Senokot-S ?Take 1 tablet by mouth 2 (two) times daily between meals as needed for mild constipation. ?  ?traZODone 50 MG tablet ?Commonly known as: DESYREL ?Take 1 tablet (50 mg total) by mouth at bedtime as needed for sleep. ?  ?vitamin B-12 500 MCG tablet ?Commonly known as: CYANOCOBALAMIN ?Take 500 mcg by mouth daily. ?  ?VITAMIN D (CHOLECALCIFEROL) PO ?Take 1 tablet by mouth daily. ?  ? ?  ? ? ?Vitals:  ? 06/02/21 1559 06/02/21 1809  ?BP:  115/66 115/65  ?Pulse: (!) 57 71  ?Resp: 18 19  ?Temp: 97.8 ?F (36.6 ?C) 97.7 ?F (36.5 ?C)  ?SpO2: 92% 97%  ? ? ?Tele box removed and returned. IV removed and intact. ? ? ?Patient escorted via stretcher, and D/C to SNF via EMS. ? ?Rolley Sims  ?

## 2021-06-06 ENCOUNTER — Telehealth: Payer: Self-pay | Admitting: Family

## 2021-06-06 ENCOUNTER — Ambulatory Visit: Payer: Medicare Other | Admitting: Family

## 2021-06-06 NOTE — Telephone Encounter (Signed)
Patient did not show for her [facility] appointment on 06/06/21. Will attempt to reschedule.   ?

## 2021-06-07 ENCOUNTER — Other Ambulatory Visit: Payer: Medicare Other | Admitting: Primary Care

## 2021-06-15 ENCOUNTER — Other Ambulatory Visit: Payer: Medicare Other | Admitting: Primary Care

## 2021-06-15 ENCOUNTER — Telehealth: Payer: Self-pay | Admitting: Cardiovascular Disease

## 2021-06-15 ENCOUNTER — Non-Acute Institutional Stay: Payer: Medicare Other | Admitting: Primary Care

## 2021-06-15 DIAGNOSIS — Z9181 History of falling: Secondary | ICD-10-CM

## 2021-06-15 DIAGNOSIS — J9612 Chronic respiratory failure with hypercapnia: Secondary | ICD-10-CM

## 2021-06-15 DIAGNOSIS — Z515 Encounter for palliative care: Secondary | ICD-10-CM

## 2021-06-15 DIAGNOSIS — M1711 Unilateral primary osteoarthritis, right knee: Secondary | ICD-10-CM

## 2021-06-15 NOTE — Telephone Encounter (Signed)
Calling to see if dr Rockey Situ with follow up with a home health order for the patient. Please advise ?

## 2021-06-15 NOTE — Progress Notes (Signed)
? ? ?Manufacturing engineer ?Community Palliative Care Consult Note ?Telephone: [phone]  ?Fax: [phone]  ? ?Due to the COVID-19 crisis, this visit was done via telemedicine from my office and it was initiated and consent by this patient and or family. ? ?I connected with  Karen [name] OR PROXY on 06/15/21 by a  telemedicine application and verified that I am speaking with the correct person using two identifiers. Video was not possible due to lack of technology of patient.  ?  ?I discussed the limitations of evaluation and management by telemedicine. The patient expressed understanding and agreed to proceed. ? ?Date of encounter: 06/15/21 ?2:57 PM ?PATIENT NAME: Karen [name] ?[place] [address]   ?[phone] (home)  ?DOB: Jan 22, 1946 ?MRN: [id number] ?PRIMARY CARE PROVIDER:    ?Karen Counts, FNP,  ?No address on file ?None ? ?REFERRING PROVIDER:   ?D, Ples Specter, FNP ?No address on file ?N/A ? ?RESPONSIBLE PARTY:    ?Contact Information   ? ? Name Relation Home Work Mobile  ? Karen [name] Spouse   [phone]  ? Karen [name] Son [phone]    ? Karen [name] Granddaughter [phone]    ? ?  ? ? ?Palliative Care was asked to follow this patient by consultation request of  D, Ples Specter, FNP to address advance care planning and complex medical decision making. This is a follow up visit. ? ?                                 ASSESSMENT AND PLAN / RECOMMENDATIONS:  ? ?Advance Care Planning/Goals of Care: Goals include to maximize quality of life and symptom management. Patient/health care surrogate gave his/her permission to discuss.Our advance care planning conversation included a discussion about:    ? ?Exploration of personal, cultural or spiritual beliefs that might influence medical decisions  ?Exploration of goals of care in the event of a sudden injury or illness  ?Code status; DNR on file ? ?Symptom Management/Plan: ? ?Patient d/c from SNF home. She has had some nausea  but is hydrating well. She has poor mobility and husband endorses doing exercises. ? ?Spent some time discussion of community services. She was referred to [facility] home health but refused services. Karen [name] and I have asked him to reconsider for in home help. He endorses needing in home help but did not understand the service is not a charge. ? ?I have called [facility] again to ask them to regenerate admissions, and email to [facility] to re send order and F2[facility] for services. She will need PT, OT and aide. Meanwhile she as also accepted to [facility] but family must make calls to find an agency who can serve them. This was explained again to husband, and Karen will assist with questions on her next visit.  ? ?Follow up Palliative Care Visit: Palliative care will continue to follow for complex medical decision making, advance care planning, and clarification of goals. Return 2-4 weeks or prn. ? ?I spent 30 minutes providing this consultation. More than 50% of the time in this consultation was spent in counseling and care coordination. ? ?PPS: 40% ? ?HOSPICE ELIGIBILITY/DIAGNOSIS: no ? ?Chief Complaint: debility, nausea ? ?HISTORY OF PRESENT ILLNESS:  ROLINDA IMPSON is a 76 y.o. year old female  with debility, nausea, CHF, CKD4, DM, fall risk . Patient seen today to review palliative care needs to include medical decision  making and advance care planning as appropriate.  ? ?History obtained from review of EMR, discussion with primary team, and interview with family, facility staff/caregiver and/or Karen [name].  ?I reviewed available labs, medications, imaging, studies and related documents from the EMR.  Records reviewed and summarized above.  ? ?ROS ?reported ? ?General: NAD ?ENMT: denies dysphagia ?Cardiovascular: denies chest pain, denies DOE ?Pulmonary: denies cough, denies increased SOB ?Abdomen: endorses poor appetite, endorses nausea denies constipation, endorses continence of bowel ?GU: denies dysuria,  endorses continence of urine ?MSK:  endorses   increased weakness,  no  recent falls reported ?Skin: denies rashes or wounds ?Neurological: denies pain, denies insomnia ?Psych: Endorses flat mood ? ?Physical Exam: ?Deferred ? ?Thank you for the opportunity to participate in the care of Karen [name].  The palliative care team will continue to follow. Please call our office at [phone] if we can be of additional assistance.  ? ?Jason Coop, NP DNP, AGPCNP-BC ? ?COVID-19 PATIENT SCREENING TOOL ?Asked and negative response unless otherwise noted:  ? ?Have you had symptoms of covid, tested positive or been in contact with someone with symptoms/positive test in the past 5-10 days?  ? ?

## 2021-06-16 ENCOUNTER — Non-Acute Institutional Stay: Payer: Medicare Other | Admitting: Primary Care

## 2021-06-16 NOTE — Telephone Encounter (Signed)
Everyone except Dr. Rockey Situ please disregard. ?Message was sent in error. ?

## 2021-06-17 NOTE — Telephone Encounter (Signed)
Merleen Nicely is calling to follow up on the [place] orders.  ?

## 2021-06-20 ENCOUNTER — Other Ambulatory Visit: Payer: Medicare Other | Admitting: Primary Care

## 2021-06-20 ENCOUNTER — Other Ambulatory Visit (HOSPITAL_COMMUNITY): Payer: Self-pay

## 2021-06-20 ENCOUNTER — Encounter (HOSPITAL_COMMUNITY): Payer: Self-pay

## 2021-06-20 DIAGNOSIS — I272 Pulmonary hypertension, unspecified: Secondary | ICD-10-CM

## 2021-06-20 DIAGNOSIS — Z9181 History of falling: Secondary | ICD-10-CM

## 2021-06-20 DIAGNOSIS — I5032 Chronic diastolic (congestive) heart failure: Secondary | ICD-10-CM

## 2021-06-20 DIAGNOSIS — M1711 Unilateral primary osteoarthritis, right knee: Secondary | ICD-10-CM

## 2021-06-20 NOTE — Progress Notes (Signed)
? ? ?Manufacturing engineer ?Community Palliative Care Consult Note ?Telephone: [phone]  ?Fax: [phone]  ? ? ?Date of encounter: 06/20/21 ?12:10 PM ?PATIENT NAME: Karen [name] ?[place] [address]   ?[phone] (home)  ?DOB: August 13, 1945 ?MRN: [id number] ? ?PRIMARY CARE PROVIDER:    ?Karen Rana, MD ?[place] [address] ?[phone] ? ?REFERRING PROVIDER:   ?Karen Graff, FNP ?Karen [name], [address] ?[address] ?[place],  [place] [address] ? ? ?RESPONSIBLE PARTY:    ?Contact Information   ? ? Name Relation Home Work Mobile  ? Karen, [name] Spouse   [phone]  ? Karen [name] Son [phone]    ? [name],Karen Granddaughter [phone]    ? ?  ? ? ?Due to the COVID-19 crisis, this visit was done via telemedicine from my office and it was initiated and consent by this patient and or family. ? ?I connected with  Karen [name] OR PROXY on 06/20/21 by a video enabled telemedicine application and verified that I am speaking with the correct person using two identifiers. ?  ?I discussed the limitations of evaluation and management by telemedicine. The patient expressed understanding and agreed to proceed. ? ?I met face to face with patient and family in  home. Palliative Care was asked to follow this patient by consultation request of  Karen Graff, FNP ? to address advance care planning and complex medical decision making. This is a follow up visit. ? ?                                 ASSESSMENT AND PLAN / RECOMMENDATIONS:  ? ?Advance Care Planning/Goals of Care: Goals include to maximize quality of life and symptom management. Patient/health care surrogate gave his/her permission to discuss.Our advance care planning conversation included a discussion about:    ?Exploration of personal, cultural or spiritual beliefs that might influence medical decisions  ?Exploration of goals of care in the event of a sudden injury or illness   ?Identification of a healthcare agent - Karen [name] ?Review of an  advance directive document - Discussed indication for Karen [name], will discuss again on next in person visit. ?CODE STATUS: DNR ? ?Symptom Management/Plan: ? ?Order for PT and OT to eval and treat. Order for [name] for personal care. Order for RN for disease and medication teaching. ?Dx  M 17.11 OA, At risk for falls ? ?Mobility: Frequent falls over time. Uses walker, rollator.  Needs help with leaving home, which takes excessive effort. She has a substantial fall risk. ? ?ADLs : Dependent due to debility and SOB. Has personal care services approval but family needs to find worker. This is a major task for pt and husband, and they likely need some help with this. Their children live out of the area. ? ?Home heath orders given to [facility] for PT and OT eval and treat. The recommendation [facility] from [facility] was for [facility] health care. Patient husband misunderstood the service on their call, and refused care initially. He now states the patient needs and can have home services. ? ?Nutrition: Poor compliance A1C =9.5%. defer to PCP to manage diabetes. ? ?Follow up Palliative Care Visit: Palliative care will continue to follow for complex medical decision making, advance care planning, and clarification of goals. Return 6-8 weeks or prn. ? ?This visit was coded based on medical decision making (MDM). ? ?  PPS: 40% ? ?HOSPICE ELIGIBILITY/DIAGNOSIS: TBD ? ?Chief Complaint: debility, immobility, cognitive impairment ? ?HISTORY OF PRESENT ILLNESS:  Karen [name] is a 76 y.o. year old female  with CHF, HFpEF, Chronic respiratory failure, OA, fall risk, cognitive impairment . Patient seen today to review palliative care needs to include medical decision making and advance care planning as appropriate.  ? ?History obtained from review of EMR, discussion with primary team, and interview with family, facility staff/caregiver and/or Karen [name].  ?I reviewed available  labs, medications, imaging, studies and related documents from the EMR.  Records reviewed and summarized above.  ? ?ROS ? ?General: NADs ?ENMT: denies dysphagia ?Cardiovascular: denies chest pain, denies DOE ?Pulmonary: denies cough, denies increased SOB ?Abdomen: endorses good appetite, denies constipation, endorses incontinence of bowel ?GU: denies dysuria, endorses incontinence of urine ?MSK:  denies  increased weakness,  no  recent falls reported ?Skin: denies rashes or wounds ?Neurological: denies pain, denies insomnia ?Psych: Endorses anxious mood ? ?Physical Exam: ?Current and past weights: 4 lb gain. ?Constitutional:118/60 HR 82 RR  24 ?General: frail appearing, obese  ?EYES: anicteric sclera, lids intact, no discharge  ?ENMT: intact hearing, oral mucous membranes moist, dentition  missing  ?CV: + LE edema ?Pulmonary: + increased work of breathing, no cough, supplemental oxygen ?Abdomen: intake 50-75%, moderate  ascites ?MSK: no sarcopenia, moves all extremities, ambulatory with walker  ?Neuro:  + generalized weakness,  mild  cognitive impairment, anxious affect ? ?Thank you for the opportunity to participate in the care of Karen [name].  The palliative care team will continue to follow. Please call our office at [phone] if we can be of additional assistance.  ? ?Jason Coop, NP DNP, AGPCNP-BC ? ?COVID-19 PATIENT SCREENING TOOL ?Asked and negative response unless otherwise noted:  ? ?Have you had symptoms of covid, tested positive or been in contact with someone with symptoms/positive test in the past 5-10 days?  ? ?

## 2021-06-20 NOTE — Telephone Encounter (Signed)
Merleen Nicely made aware of Dr. Donivan Scull response with verbalized understanding. ?

## 2021-06-20 NOTE — Telephone Encounter (Signed)
?  Karen [name] is calling back to f/u. She said, pt is getting upset with her trying to get the orders ?

## 2021-06-20 NOTE — Progress Notes (Signed)
Today had a home visit with Karen [name].  She states her weight is up 4 lbs overnight.  She states did not take her 2pm lasix.  Explaind to her that is why.  Had a televisit with Palliative Care nurse while I was there and she advised for her to take 40 mg this and continue with 1 at 2 pm.  She states she will and will check on her tomorrow.  She denies any chest pain, headaches, dizziness.  She denies increased in shortness of breath.  Her belly appears tight but she states about normal.  About normal edema for her in her legs. She states been watching her sodium and she states drinking only 4 bottles of water.  Explained to her coffee and other liquids count.  She has to cut back on water to account for those fluids.  She appears to understand.  She states her sugars has been better.  Appetite is good.  Working with [facility] to get them services int he home.  She did advised she will be getting Moms meals for 2 weeks, she does really well while on them.  She is aware of up coming appts.  Will continue to visit for heart failure, diet and medication management.  ? ?Jaimee Corum ?[place] EMT-Paramedic ?[phone] ?

## 2021-06-23 ENCOUNTER — Encounter (HOSPITAL_COMMUNITY): Payer: Self-pay

## 2021-06-23 ENCOUNTER — Ambulatory Visit: Payer: Medicare Other | Admitting: Family

## 2021-06-23 NOTE — Progress Notes (Signed)
Went to [facility] and called [facility] home services for her and gave them 3 choices for an aid.  They have been approved for services.  ? ?Gracee Ratterree ?[place] EMT-Paramedic ?[phone] ?

## 2021-07-07 ENCOUNTER — Encounter: Payer: Self-pay | Admitting: Family

## 2021-07-07 ENCOUNTER — Ambulatory Visit: Payer: Medicare Other | Attending: Family | Admitting: Family

## 2021-07-07 VITALS — BP 106/47 | HR 89 | Resp 16 | Ht 63.0 in | Wt 211.2 lb

## 2021-07-07 DIAGNOSIS — I130 Hypertensive heart and chronic kidney disease with heart failure and stage 1 through stage 4 chronic kidney disease, or unspecified chronic kidney disease: Secondary | ICD-10-CM | POA: Diagnosis not present

## 2021-07-07 DIAGNOSIS — E785 Hyperlipidemia, unspecified: Secondary | ICD-10-CM | POA: Diagnosis not present

## 2021-07-07 DIAGNOSIS — J45909 Unspecified asthma, uncomplicated: Secondary | ICD-10-CM | POA: Insufficient documentation

## 2021-07-07 DIAGNOSIS — I5032 Chronic diastolic (congestive) heart failure: Secondary | ICD-10-CM

## 2021-07-07 DIAGNOSIS — E1136 Type 2 diabetes mellitus with diabetic cataract: Secondary | ICD-10-CM | POA: Diagnosis not present

## 2021-07-07 DIAGNOSIS — J449 Chronic obstructive pulmonary disease, unspecified: Secondary | ICD-10-CM | POA: Diagnosis not present

## 2021-07-07 DIAGNOSIS — D649 Anemia, unspecified: Secondary | ICD-10-CM | POA: Diagnosis not present

## 2021-07-07 DIAGNOSIS — E1122 Type 2 diabetes mellitus with diabetic chronic kidney disease: Secondary | ICD-10-CM | POA: Diagnosis not present

## 2021-07-07 DIAGNOSIS — E1165 Type 2 diabetes mellitus with hyperglycemia: Secondary | ICD-10-CM | POA: Diagnosis not present

## 2021-07-07 DIAGNOSIS — I10 Essential (primary) hypertension: Secondary | ICD-10-CM | POA: Diagnosis not present

## 2021-07-07 DIAGNOSIS — Z794 Long term (current) use of insulin: Secondary | ICD-10-CM

## 2021-07-07 NOTE — Patient Instructions (Addendum)
Continue weighing daily and call for an overnight weight gain of 3 pounds or more or a weekly weight gain of more than 5 pounds. ? ? ?If you have voicemail, please make sure your mailbox is cleaned out so that we may leave a message and please make sure to listen to any voicemails.  ? ? ?Get compression socks and put them on every morning with removal at bedtime ?

## 2021-07-07 NOTE — Progress Notes (Signed)
? Patient ID: Karen [name], female    DOB: [birth year], 76 y.o.   MRN: [id number] ? ?Karen [name] is a 76 y/o female with a history of HTN, CKD, DM, asthma, hyperlipidemia, COPD, anemia and chronic heart failure.   ? ?Echo report from 02/06/21 reviewed and showed an EF of 60-65%, no LVR. RV mildly enlarged, severely elevated PA systolic pressure. Echo report from 02/14/2019 reviewed and showed an EF of 65-70% without LVH along with trivial MR/TR and moderately elevated PA pressure.  ? ?Admitted 05/28/21 due to worsening SOB due to acute on chronic HF. Initially given IV lasix with transition to oral diuretics. Found to have sleep apnea and placed on auto PAP/CPAP with settings of 5-20 cmH20.Palliative care consult obtained. PT evaluation done. Discharged after 5 days. Admitted 02/01/21 11 days for HF exacerbation, COPD exacerbation and encephalopathy. Discharged to SNF.  ? ?She presents today for a follow-up visit with a chief complaint of minimal fatigue upon moderate exertion. Describes this as chronic in nature. She has associated cough, pedal edema (improving) and chronic pain along with this. She denies any difficulty sleeping, dizziness, abdominal distention, palpitations, chest pain, shortness of breath or weight gain.  ? ?Wearing her oxygen at 3L around the clock.  ? ?Past Medical History:  ?Diagnosis Date  ? (HFpEF) heart failure with preserved ejection fraction ([place])   ? a. 2017 Echo: EF 50%; b. 06/2018 Echo: EF 50-55%; c. 08/2018 Echo: EF 50-55%, Nl RV fxn; d. 09/2019 Echo: EF 55-60%, no rwma, mild LVH, Gr1 DD, nl RV size/fxn, PASP 63.11mmHg. Mildly dil LA. Triv MR. Mod AS (AoV 0.94cm^2 VTI; mean grad 17.59mmHg).  ? Acute on chronic respiratory failure with hypoxia and hypercapnia ([place]) 01/07/2015  ? Anemia   ? Asterixis 01/07/2015  ? Asthma   ? Cataract   ? CKD (chronic kidney disease), stage III ([place])   ? COPD (chronic obstructive pulmonary disease) ([place])   ? a. 06/2018 tobacco use, home 3L oxygen   ? Diabetes  mellitus without complication ([place])   ? a. 09/2019 A1C 8.8  ? Edema, peripheral 04/20/2014  ? GI bleed 06/28/2019  ? History of kidney stones   ? Hyperlipidemia   ? Hypertension   ? Iron deficiency anemia 06/22/2014  ? Junctional bradycardia   ? a. In setting of beta blocker therapy.  ? Leucocytosis 10/19/2015  ? Moderate aortic stenosis   ? a.  09/2019 Echo: Mod AS (AoV 0.94cm^2 VTI; mean grad 17.61mmHg).  ? Morbid obesity ([place])   ? Overactive bladder   ? Primary osteoarthritis of right knee 09/01/2016  ? Sciatica 01/07/2015  ? ?Past Surgical History:  ?Procedure Laterality Date  ? APPENDECTOMY    ? CESAREAN SECTION    ? x3  ? CHOLECYSTECTOMY    ? COLONOSCOPY WITH PROPOFOL N/A 08/28/2017  ? Procedure: COLONOSCOPY WITH PROPOFOL;  Surgeon: Lucilla Lame, MD;  Location: [facility] ENDOSCOPY;  Service: Endoscopy;  Laterality: N/A;  ? COLONOSCOPY WITH PROPOFOL N/A 08/29/2017  ? Procedure: COLONOSCOPY WITH PROPOFOL;  Surgeon: Lucilla Lame, MD;  Location: [facility] ENDOSCOPY;  Service: Endoscopy;  Laterality: N/A;  ? CYSTOSCOPY W/ URETERAL STENT PLACEMENT Right 09/15/2017  ? Procedure: CYSTOSCOPY WITH RETROGRADE PYELOGRAM/URETERAL STENT PLACEMENT;  Surgeon: Cleon Gustin, MD;  Location: [facility] ORS;  Service: Urology;  Laterality: Right;  ? CYSTOSCOPY/URETEROSCOPY/HOLMIUM LASER/STENT PLACEMENT Right 10/09/2017  ? Procedure: CYSTOSCOPY/URETEROSCOPY/HOLMIUM LASER/STENT PLACEMENT;  Surgeon: Abbie Sons, MD;  Location: [facility] ORS;  Service: Urology;  Laterality: Right;  right Stent exchange  ?  ESOPHAGOGASTRODUODENOSCOPY (EGD) WITH PROPOFOL N/A 09/16/2018  ? Procedure: ESOPHAGOGASTRODUODENOSCOPY (EGD) WITH PROPOFOL;  Surgeon: Lin Landsman, MD;  Location: [facility] ENDOSCOPY;  Service: Gastroenterology;  Laterality: N/A;  ? EYE SURGERY    ? IR THORACENTESIS ASP PLEURAL SPACE W/IMG GUIDE  02/07/2021  ? ?Family History  ?Problem Relation Age of Onset  ? Other Mother   ?     unknown medical history  ? Other Father   ?     unknown medical history   ? ?Social History  ? ?Tobacco Use  ? Smoking status: Former  ?  Packs/day: 1.00  ?  Years: 20.00  ?  Pack years: 20.00  ?  Types: Cigarettes  ?  Quit date: 12/04/1992  ?  Years since quitting: 28.6  ? Smokeless tobacco: Never  ?Substance Use Topics  ? Alcohol use: No  ? ?Allergies  ?Allergen Reactions  ? Ace Inhibitors Hives  ? Beta Adrenergic Blockers   ?  Junctional bradycardia  ? Gabapentin Hives  ? Lisinopril Hives  ? Lyrica [Pregabalin] Hives  ? Shrimp [Shellfish Allergy] Swelling  ?  Swelling of the lips  ? ?Prior to Admission medications   ?Medication Sig Start Date End Date Taking? Authorizing Provider  ?acetaminophen (TYLENOL) 500 MG tablet Take 1-2 tablets (500-1,000 mg total) by mouth every 6 (six) hours as needed for mild pain, fever, moderate pain or headache. Do not take more than 4 grams a day 02/05/21  Yes Gonfa, Charlesetta Ivory, MD  ?albuterol (VENTOLIN HFA) 108 (90 Base) MCG/ACT inhaler Inhale 2 puffs into the lungs every 6 (six) hours as needed for wheezing or shortness of breath.    Yes [provider]  ?allopurinol (ZYLOPRIM) 100 MG tablet Take 1 tablet by mouth daily.   Yes [provider]  ?apixaban (ELIQUIS) 5 MG TABS tablet Take 1 tablet by mouth twice daily 04/06/21  Yes Gollan, Kathlene November, MD  ?atorvastatin (LIPITOR) 10 MG tablet Take 1 tablet by mouth daily. 12/17/20  Yes [provider]  ?Dulaglutide 3 MG/0.5ML SOPN Inject 0.5 mLs into the skin once a week. 04/11/21  Yes [provider]  ?esomeprazole (NEXIUM) 40 MG capsule Take 40 mg by mouth daily. 12/17/18  Yes [provider]  ?Ferrous Sulfate (IRON) 325 (65 Fe) MG TABS Take 1 tablet (325 mg total) by mouth daily. 02/12/21  Yes Wieting, Richard, MD  ?fluticasone (FLONASE) 50 MCG/ACT nasal spray Place 2 sprays into both nostrils daily. 11/15/18  Yes [provider]  ?Fluticasone-Umeclidin-Vilant 100-62.5-25 MCG/INH AEPB Inhale 1 puff into the lungs daily. 12/25/17  Yes [provider]   ?furosemide (LASIX) 20 MG tablet Take 2 tablets (40 mg total) by mouth 2 (two) times daily. ?Patient taking differently: Take 40 mg by mouth 2 (two) times daily. She is only taking 20 mg in am and 20 mg at 2pm 05/13/21  Yes Gollan, Kathlene November, MD  ?insulin glargine (LANTUS SOLOSTAR) 100 UNIT/ML Solostar Pen Inject 45 Units into the skin daily. 06/03/21  Yes Jennye Boroughs, MD  ?insulin lispro (HUMALOG) 100 UNIT/ML KwikPen Inject 10 Units into the skin in the morning, at noon, and at bedtime. 10 units with meals plus additional units for correction 01/22/21  Yes [provider]  ?ipratropium (ATROVENT) 0.06 % nasal spray Place 2 sprays into both nostrils 4 (four) times daily. 05/28/20  Yes Margarette Canada, NP  ?ipratropium-albuterol (DUONEB) 0.5-2.5 (3) MG/3ML SOLN Take 3 mLs by nebulization 4 (four) times daily as needed. 10/20/20  Yes [provider]  ?losartan (COZAAR) 50 MG tablet Take 25 mg by mouth daily. 01/16/21  Yes [provider]  ?melatonin 5 MG TABS Take 5 mg by mouth at bedtime. 11/10/20  Yes [provider]  ?metFORMIN (GLUCOPHAGE) 500 MG tablet Take 500 mg by mouth 2 (two) times daily. 04/16/21  Yes [provider]  ?montelukast (SINGULAIR) 10 MG tablet Take 10 mg by mouth at bedtime.   Yes [provider]  ?senna-docusate (SENOKOT-S) 8.6-50 MG tablet Take 1 tablet by mouth 2 (two) times daily between meals as needed for mild constipation. 02/05/21  Yes Mercy Riding, MD  ?traZODone (DESYREL) 50 MG tablet Take 1 tablet (50 mg total) by mouth at bedtime as needed for sleep. 02/12/21  Yes Wieting, Richard, MD  ?vitamin B-12 (CYANOCOBALAMIN) 500 MCG tablet Take 500 mcg by mouth daily.   Yes [provider]  ?VITAMIN D, CHOLECALCIFEROL, PO Take 1 tablet by mouth daily.   Yes [provider]  ? ?Review of Systems  ?Constitutional:  Positive for fatigue. Negative for appetite change.  ?HENT:  Negative for congestion, postnasal drip and sore  throat.   ?Eyes: Negative.   ?Respiratory:  Positive for cough. Negative for shortness of breath.   ?Cardiovascular:  Positive for leg swelling (improving). Negative for chest pain and palpitations.  ?Gastrointesti

## 2021-07-14 ENCOUNTER — Other Ambulatory Visit (HOSPITAL_COMMUNITY): Payer: Self-pay

## 2021-07-14 ENCOUNTER — Encounter (HOSPITAL_COMMUNITY): Payer: Self-pay

## 2021-07-14 NOTE — Progress Notes (Signed)
Had a home visit with Karen [name] and husband today.  She has gained 3 lbs overnight.  They discovered she had her fluid pill bottle on the table separate from her meds to take at 2pm and forgot to place in her med boxes.  She has started back on them today.  She is urinating more this am.  She denies any problems such as increased shortness of breath, edema in lower legs, abdomen appears larger but not tight, and has no chest pain.  She denies more of of a cough than normal.  Lungs sounds clear.  She is sleeping well.  Appetite is good.  Drinking more than 60 ozs, discussed this.  Sugars has been running in 100's to 200's, no 300's.  She checks it frequently.  Advised her to contact HF clinic in am if weight goes up, will check on her in a few days to check her weight.  She has all her medications.  She is aware of up coming appts.  Her PT has stopped and OT is stopping this week.  She has not heard form any aid services yet, will call them to find out any information for them.  Will advise palliative Care also.  Will continue to visit for heart failure, diet and medication management.  ? ?Karen [name] ? EMT-Paramedic ?[phone] ?

## 2021-07-18 ENCOUNTER — Telehealth (HOSPITAL_COMMUNITY): Payer: Self-pay

## 2021-07-18 ENCOUNTER — Other Ambulatory Visit: Payer: Medicare Other | Admitting: Primary Care

## 2021-07-18 NOTE — Telephone Encounter (Signed)
Contacted Karen [name] today to check on her weight and how she is feeling.  Her weight did come down on Friday since started her medications back but today went back up to 209 lbs.  Went over what she ate yesterday on mothers day, she had sausage and egg biscuit for breakfast, fried chicken from [employer] for lunch and popusas for supper. She states drank too much water yesterday.  She states stayed thirsty yesterday and did not urinate as much.  She states her sugar was also higher this morning.  Went over what she ate and why she did not urinate as much, sugar is elevated and why she gained weight.  She did states she took her meds which is a good thing.  Advised her to prop feet up today, rest, watch closely what she eats today and only drink 60 ozs fluid.  Will call her in am to check her weight and will visit fi need so.  She denies any increased shortness of breath, chest pain, cough or edema in legs or abdominal area.  Will continue to visit for heart failure, diet and medication management.  ? ?Susana Gripp ?[place] EMT-Paramedic ?[phone] ?

## 2021-08-03 ENCOUNTER — Telehealth (HOSPITAL_COMMUNITY): Payer: Self-pay

## 2021-08-03 ENCOUNTER — Other Ambulatory Visit: Payer: Self-pay

## 2021-08-03 ENCOUNTER — Inpatient Hospital Stay
Admission: EM | Admit: 2021-08-03 | Discharge: 2021-08-10 | DRG: 291 | Disposition: A | Discharge status: Skilled Nursing Facility | Payer: Medicare Other | Attending: Internal Medicine | Admitting: Internal Medicine

## 2021-08-03 ENCOUNTER — Emergency Department: Payer: Medicare Other

## 2021-08-03 DIAGNOSIS — I509 Heart failure, unspecified: Principal | ICD-10-CM | POA: Insufficient documentation

## 2021-08-03 DIAGNOSIS — I959 Hypotension, unspecified: Secondary | ICD-10-CM | POA: Diagnosis present

## 2021-08-03 DIAGNOSIS — J9611 Chronic respiratory failure with hypoxia: Secondary | ICD-10-CM | POA: Diagnosis present

## 2021-08-03 DIAGNOSIS — E1122 Type 2 diabetes mellitus with diabetic chronic kidney disease: Secondary | ICD-10-CM | POA: Diagnosis present

## 2021-08-03 DIAGNOSIS — Z66 Do not resuscitate: Secondary | ICD-10-CM | POA: Diagnosis present

## 2021-08-03 DIAGNOSIS — E871 Hypo-osmolality and hyponatremia: Secondary | ICD-10-CM | POA: Diagnosis present

## 2021-08-03 DIAGNOSIS — I272 Pulmonary hypertension, unspecified: Secondary | ICD-10-CM

## 2021-08-03 DIAGNOSIS — J449 Chronic obstructive pulmonary disease, unspecified: Secondary | ICD-10-CM | POA: Diagnosis present

## 2021-08-03 DIAGNOSIS — Z87891 Personal history of nicotine dependence: Secondary | ICD-10-CM

## 2021-08-03 DIAGNOSIS — I2721 Secondary pulmonary arterial hypertension: Secondary | ICD-10-CM | POA: Diagnosis present

## 2021-08-03 DIAGNOSIS — J9621 Acute and chronic respiratory failure with hypoxia: Secondary | ICD-10-CM | POA: Diagnosis present

## 2021-08-03 DIAGNOSIS — I130 Hypertensive heart and chronic kidney disease with heart failure and stage 1 through stage 4 chronic kidney disease, or unspecified chronic kidney disease: Principal | ICD-10-CM | POA: Diagnosis present

## 2021-08-03 DIAGNOSIS — I5033 Acute on chronic diastolic (congestive) heart failure: Secondary | ICD-10-CM | POA: Diagnosis present

## 2021-08-03 DIAGNOSIS — R079 Chest pain, unspecified: Secondary | ICD-10-CM | POA: Diagnosis not present

## 2021-08-03 DIAGNOSIS — G4700 Insomnia, unspecified: Secondary | ICD-10-CM | POA: Diagnosis present

## 2021-08-03 DIAGNOSIS — G4733 Obstructive sleep apnea (adult) (pediatric): Secondary | ICD-10-CM | POA: Diagnosis present

## 2021-08-03 DIAGNOSIS — E876 Hypokalemia: Secondary | ICD-10-CM | POA: Diagnosis not present

## 2021-08-03 DIAGNOSIS — D72829 Elevated white blood cell count, unspecified: Secondary | ICD-10-CM | POA: Diagnosis present

## 2021-08-03 DIAGNOSIS — Z79899 Other long term (current) drug therapy: Secondary | ICD-10-CM

## 2021-08-03 DIAGNOSIS — M109 Gout, unspecified: Secondary | ICD-10-CM | POA: Diagnosis present

## 2021-08-03 DIAGNOSIS — Z7901 Long term (current) use of anticoagulants: Secondary | ICD-10-CM

## 2021-08-03 DIAGNOSIS — Z9981 Dependence on supplemental oxygen: Secondary | ICD-10-CM

## 2021-08-03 DIAGNOSIS — N189 Chronic kidney disease, unspecified: Secondary | ICD-10-CM | POA: Diagnosis not present

## 2021-08-03 DIAGNOSIS — I480 Paroxysmal atrial fibrillation: Secondary | ICD-10-CM | POA: Diagnosis present

## 2021-08-03 DIAGNOSIS — N1831 Chronic kidney disease, stage 3a: Secondary | ICD-10-CM | POA: Diagnosis present

## 2021-08-03 DIAGNOSIS — D509 Iron deficiency anemia, unspecified: Secondary | ICD-10-CM | POA: Diagnosis present

## 2021-08-03 DIAGNOSIS — Z91199 Patient's noncompliance with other medical treatment and regimen due to unspecified reason: Secondary | ICD-10-CM

## 2021-08-03 DIAGNOSIS — T502X5A Adverse effect of carbonic-anhydrase inhibitors, benzothiadiazides and other diuretics, initial encounter: Secondary | ICD-10-CM | POA: Diagnosis present

## 2021-08-03 DIAGNOSIS — E785 Hyperlipidemia, unspecified: Secondary | ICD-10-CM | POA: Diagnosis present

## 2021-08-03 DIAGNOSIS — D631 Anemia in chronic kidney disease: Secondary | ICD-10-CM | POA: Diagnosis present

## 2021-08-03 DIAGNOSIS — N179 Acute kidney failure, unspecified: Secondary | ICD-10-CM | POA: Diagnosis not present

## 2021-08-03 DIAGNOSIS — I2781 Cor pulmonale (chronic): Secondary | ICD-10-CM | POA: Diagnosis present

## 2021-08-03 DIAGNOSIS — Z7951 Long term (current) use of inhaled steroids: Secondary | ICD-10-CM

## 2021-08-03 DIAGNOSIS — Z6837 Body mass index (BMI) 37.0-37.9, adult: Secondary | ICD-10-CM

## 2021-08-03 DIAGNOSIS — Z91013 Allergy to seafood: Secondary | ICD-10-CM

## 2021-08-03 DIAGNOSIS — E1165 Type 2 diabetes mellitus with hyperglycemia: Secondary | ICD-10-CM | POA: Diagnosis present

## 2021-08-03 DIAGNOSIS — Z7984 Long term (current) use of oral hypoglycemic drugs: Secondary | ICD-10-CM

## 2021-08-03 DIAGNOSIS — G8929 Other chronic pain: Secondary | ICD-10-CM | POA: Diagnosis present

## 2021-08-03 DIAGNOSIS — Z794 Long term (current) use of insulin: Secondary | ICD-10-CM | POA: Diagnosis not present

## 2021-08-03 HISTORY — DX: Endocarditis, valve unspecified: I38

## 2021-08-03 LAB — URINALYSIS, ROUTINE W REFLEX MICROSCOPIC
Bilirubin Urine: NEGATIVE
Glucose, UA: NEGATIVE mg/dL
Ketones, ur: NEGATIVE mg/dL
Nitrite: POSITIVE — AB
Protein, ur: NEGATIVE mg/dL
Specific Gravity, Urine: 1.006 (ref 1.005–1.030)
pH: 6 (ref 5.0–8.0)

## 2021-08-03 LAB — BASIC METABOLIC PANEL
Anion gap: 8 (ref 5–15)
BUN: 46 mg/dL — ABNORMAL HIGH (ref 8–23)
CO2: 30 mmol/L (ref 22–32)
Calcium: 9.1 mg/dL (ref 8.9–10.3)
Chloride: 89 mmol/L — ABNORMAL LOW (ref 98–111)
Creatinine, Ser: 1.06 mg/dL — ABNORMAL HIGH (ref 0.44–1.00)
GFR, Estimated: 54 mL/min — ABNORMAL LOW (ref >60)
Glucose, Bld: 189 mg/dL — ABNORMAL HIGH (ref 70–99)
Potassium: 3.6 mmol/L (ref 3.5–5.1)
Sodium: 127 mmol/L — ABNORMAL LOW (ref 135–145)

## 2021-08-03 LAB — CBC
HCT: 31.3 % — ABNORMAL LOW (ref 36.0–46.0)
Hemoglobin: 10.3 g/dL — ABNORMAL LOW (ref 12.0–15.0)
MCH: 28.9 pg (ref 26.0–34.0)
MCHC: 32.9 g/dL (ref 30.0–36.0)
MCV: 87.9 fL (ref 80.0–100.0)
Platelets: 273 10*3/uL (ref 150–400)
RBC: 3.56 MIL/uL — ABNORMAL LOW (ref 3.87–5.11)
RDW: 15 % (ref 11.5–15.5)
WBC: 20 10*3/uL — ABNORMAL HIGH (ref 4.0–10.5)
nRBC: 0 % (ref 0.0–0.2)

## 2021-08-03 LAB — BRAIN NATRIURETIC PEPTIDE: B Natriuretic Peptide: 310.4 pg/mL — ABNORMAL HIGH (ref 0.0–100.0)

## 2021-08-03 LAB — TROPONIN I (HIGH SENSITIVITY)
Troponin I (High Sensitivity): 11 ng/L (ref <18)
Troponin I (High Sensitivity): 10 ng/L (ref <18)

## 2021-08-03 LAB — CBG MONITORING, ED: Glucose-Capillary: 329 mg/dL — ABNORMAL HIGH (ref 70–99)

## 2021-08-03 MED ORDER — SENNOSIDES-DOCUSATE SODIUM 8.6-50 MG PO TABS: 1.0000 | ORAL_TABLET | Freq: Two times a day (BID) | ORAL | Status: DC | PRN

## 2021-08-03 MED ORDER — IPRATROPIUM-ALBUTEROL 0.5-2.5 (3) MG/3ML IN SOLN: 3.0000 mL | Freq: Four times a day (QID) | RESPIRATORY_TRACT | Status: DC | PRN

## 2021-08-03 MED ORDER — ALLOPURINOL 100 MG PO TABS
100.0000 mg | ORAL_TABLET | Freq: Every day | ORAL | Status: DC
  Administered 2021-08-03 – 2021-08-10 (×8): 100 mg via ORAL
  Filled 2021-08-03 (×8): qty 1

## 2021-08-03 MED ORDER — FUROSEMIDE 10 MG/ML IJ SOLN
40.0000 mg | Freq: Two times a day (BID) | INTRAMUSCULAR | Status: DC
  Administered 2021-08-04 – 2021-08-06 (×6): 40 mg via INTRAVENOUS
  Filled 2021-08-03 (×7): qty 4

## 2021-08-03 MED ORDER — ATORVASTATIN CALCIUM 10 MG PO TABS
10.0000 mg | ORAL_TABLET | Freq: Every day | ORAL | Status: DC
Start: 2021-08-04 — End: 2021-08-10
  Administered 2021-08-04 – 2021-08-10 (×7): 10 mg via ORAL
  Filled 2021-08-03 (×7): qty 1

## 2021-08-03 MED ORDER — IPRATROPIUM BROMIDE 0.06 % NA SOLN: 2.0000 | Freq: Four times a day (QID) | NASAL | Status: DC | PRN

## 2021-08-03 MED ORDER — MONTELUKAST SODIUM 10 MG PO TABS
10.0000 mg | ORAL_TABLET | Freq: Every day | ORAL | Status: DC
  Administered 2021-08-03 – 2021-08-09 (×7): 10 mg via ORAL
  Filled 2021-08-03 (×7): qty 1

## 2021-08-03 MED ORDER — ALBUTEROL SULFATE (2.5 MG/3ML) 0.083% IN NEBU: 3.0000 mL | INHALATION_SOLUTION | Freq: Four times a day (QID) | RESPIRATORY_TRACT | Status: DC | PRN

## 2021-08-03 MED ORDER — FLUTICASONE-UMECLIDIN-VILANT 100-62.5-25 MCG/INH IN AEPB: 1.0000 | INHALATION_SPRAY | Freq: Every day | RESPIRATORY_TRACT | Status: DC

## 2021-08-03 MED ORDER — HYDRALAZINE HCL 20 MG/ML IJ SOLN: 5.0000 mg | Freq: Four times a day (QID) | INTRAMUSCULAR | Status: DC | PRN

## 2021-08-03 MED ORDER — PANTOPRAZOLE SODIUM 40 MG PO TBEC
80.0000 mg | DELAYED_RELEASE_TABLET | Freq: Every day | ORAL | Status: DC
  Administered 2021-08-04 – 2021-08-10 (×7): 80 mg via ORAL
  Filled 2021-08-03 (×7): qty 2

## 2021-08-03 MED ORDER — ONDANSETRON HCL 4 MG PO TABS: 4.0000 mg | ORAL_TABLET | Freq: Four times a day (QID) | ORAL | Status: DC | PRN

## 2021-08-03 MED ORDER — ACETAMINOPHEN 500 MG PO TABS: 500.0000 mg | ORAL_TABLET | Freq: Four times a day (QID) | ORAL | Status: DC | PRN

## 2021-08-03 MED ORDER — UMECLIDINIUM BROMIDE 62.5 MCG/ACT IN AEPB
1.0000 | INHALATION_SPRAY | Freq: Every day | RESPIRATORY_TRACT | Status: DC
  Administered 2021-08-05 – 2021-08-10 (×6): 1 via RESPIRATORY_TRACT
  Filled 2021-08-03: qty 7

## 2021-08-03 MED ORDER — CYANOCOBALAMIN 500 MCG PO TABS
500.0000 ug | ORAL_TABLET | Freq: Every day | ORAL | Status: DC
  Administered 2021-08-04 – 2021-08-10 (×7): 500 ug via ORAL
  Filled 2021-08-03 (×7): qty 1

## 2021-08-03 MED ORDER — INSULIN GLARGINE-YFGN 100 UNIT/ML ~~LOC~~ SOLN
22.0000 [IU] | Freq: Every day | SUBCUTANEOUS | Status: DC
  Administered 2021-08-03 – 2021-08-05 (×3): 22 [IU] via SUBCUTANEOUS
  Filled 2021-08-03 (×4): qty 0.22

## 2021-08-03 MED ORDER — TRAZODONE HCL 50 MG PO TABS: 50.0000 mg | ORAL_TABLET | Freq: Every evening | ORAL | Status: DC | PRN

## 2021-08-03 MED ORDER — FUROSEMIDE 10 MG/ML IJ SOLN
60.0000 mg | Freq: Once | INTRAMUSCULAR | Status: AC
Start: 2021-08-03 — End: 2021-08-03
  Administered 2021-08-03: 60 mg via INTRAVENOUS
  Filled 2021-08-03: qty 8

## 2021-08-03 MED ORDER — FLUTICASONE FUROATE-VILANTEROL 100-25 MCG/ACT IN AEPB
1.0000 | INHALATION_SPRAY | Freq: Every day | RESPIRATORY_TRACT | Status: DC
  Administered 2021-08-05 – 2021-08-10 (×6): 1 via RESPIRATORY_TRACT
  Filled 2021-08-03: qty 28

## 2021-08-03 MED ORDER — INSULIN ASPART 100 UNIT/ML IJ SOLN
0.0000 [IU] | Freq: Every day | INTRAMUSCULAR | Status: DC
  Administered 2021-08-03: 4 [IU] via SUBCUTANEOUS
  Administered 2021-08-04 – 2021-08-05 (×2): 3 [IU] via SUBCUTANEOUS
  Administered 2021-08-07: 2 [IU] via SUBCUTANEOUS
  Administered 2021-08-08: 3 [IU] via SUBCUTANEOUS
  Administered 2021-08-09: 2 [IU] via SUBCUTANEOUS
  Filled 2021-08-03 (×6): qty 1

## 2021-08-03 MED ORDER — LOSARTAN POTASSIUM 50 MG PO TABS
50.0000 mg | ORAL_TABLET | Freq: Every day | ORAL | Status: DC
Start: 2021-08-04 — End: 2021-08-04

## 2021-08-03 MED ORDER — FLUTICASONE PROPIONATE 50 MCG/ACT NA SUSP: 2.0000 | Freq: Every day | NASAL | Status: DC | PRN

## 2021-08-03 MED ORDER — ONDANSETRON HCL 4 MG/2ML IJ SOLN: 4.0000 mg | Freq: Four times a day (QID) | INTRAMUSCULAR | Status: DC | PRN

## 2021-08-03 MED ORDER — MELATONIN 5 MG PO TABS
5.0000 mg | ORAL_TABLET | Freq: Every day | ORAL | Status: DC
  Administered 2021-08-03 – 2021-08-09 (×7): 5 mg via ORAL
  Filled 2021-08-03 (×7): qty 1

## 2021-08-03 MED ORDER — FERROUS SULFATE 325 (65 FE) MG PO TABS
325.0000 mg | ORAL_TABLET | Freq: Every day | ORAL | Status: DC
  Administered 2021-08-04 – 2021-08-10 (×7): 325 mg via ORAL
  Filled 2021-08-03 (×7): qty 1

## 2021-08-03 MED ORDER — APIXABAN 5 MG PO TABS
5.0000 mg | ORAL_TABLET | Freq: Two times a day (BID) | ORAL | Status: DC
  Administered 2021-08-03 – 2021-08-10 (×14): 5 mg via ORAL
  Filled 2021-08-03 (×14): qty 1

## 2021-08-03 MED ORDER — INSULIN ASPART 100 UNIT/ML IJ SOLN
5.0000 [IU] | Freq: Three times a day (TID) | INTRAMUSCULAR | Status: DC
  Administered 2021-08-04 – 2021-08-09 (×16): 5 [IU] via SUBCUTANEOUS
  Filled 2021-08-03 (×14): qty 1

## 2021-08-03 MED ORDER — INSULIN ASPART 100 UNIT/ML IJ SOLN
0.0000 [IU] | Freq: Three times a day (TID) | INTRAMUSCULAR | Status: DC
  Administered 2021-08-04: 4 [IU] via SUBCUTANEOUS
  Administered 2021-08-04: 20 [IU] via SUBCUTANEOUS
  Administered 2021-08-04: 15 [IU] via SUBCUTANEOUS
  Administered 2021-08-05: 11 [IU] via SUBCUTANEOUS
  Administered 2021-08-06: 7 [IU] via SUBCUTANEOUS
  Administered 2021-08-06: 20 [IU] via SUBCUTANEOUS
  Administered 2021-08-06 – 2021-08-07 (×2): 7 [IU] via SUBCUTANEOUS
  Administered 2021-08-07: 11 [IU] via SUBCUTANEOUS
  Administered 2021-08-07: 7 [IU] via SUBCUTANEOUS
  Administered 2021-08-08: 11 [IU] via SUBCUTANEOUS
  Administered 2021-08-08: 7 [IU] via SUBCUTANEOUS
  Administered 2021-08-08: 11 [IU] via SUBCUTANEOUS
  Administered 2021-08-09 – 2021-08-10 (×4): 7 [IU] via SUBCUTANEOUS
  Administered 2021-08-10: 11 [IU] via SUBCUTANEOUS
  Administered 2021-08-10: 3 [IU] via SUBCUTANEOUS
  Filled 2021-08-03 (×17): qty 1

## 2021-08-03 NOTE — Telephone Encounter (Signed)
Her husband contacted me and advised she is up to 214 lbs today.  Her weight usually stays around 204 to 207 lbs.  He states started a couple of days ago.  Advised I could come visit with her, examine her and contact HF clinic for advice.  He states he is unable to care for her and afraid she is going to fall.  He states she is in bad shape.  He advised her BP is 170's and short of breath.  Advised him to contact 911 and transport to ED for eval then.  Will follow her through her hospital stay and visit when discharged for heart failure, diet and medication management.   [place] [phone]

## 2021-08-03 NOTE — ED Triage Notes (Signed)
Pt states that she weighs herself daily, and this am when she weighed she had gained 40 lbs overnight. Pt reports that she is having chest pain and sob

## 2021-08-03 NOTE — H&P (Signed)
History and Physical    Patient: Karen [name] NLZ:[id number] DOB: May 01, 1945 DOA: 08/03/2021 DOS: the patient was seen and examined on 08/03/2021 PCP: Margarita Rana, MD  Patient coming from: Home  Chief Complaint:  Chief Complaint  Patient presents with   Shortness of Breath   HPI: Karen [name] is a 76 y.o. female with medical history significant of dCHF, morbid obesity, COPD, chronic hypoxic respiratory failure on 3 L, sleep apnea not on CPAP, chronic kidney disease stage II/IIIA, chronic anemia, hyperlipidemia, poorly controlled insulin-dependent diabetes mellitus type 2, gout, iron deficiency anemia, former tobacco use, PAF on Eliquis, pulmonary hypertension.  This patient presents to the emergency department with shortness of breath.  She is managed at the heart failure clinic.  States that she has exertional dyspnea and unable to perform her ADLs. Usually gets around with a walker but unable to do so.  Told the ED provider she has gained 40 pounds overnight. Review of her hospital discharge weight was 94 kg previously and today she is under 118 kg.  Started on IV diuresis with Lasix 60 mg in the emergency department BNP elevated at 310 which appears higher than her baseline slightly.  Labs show creatinine at baseline.  Sodium 127 and white blood cell count of 20 with no signs of infection.  Review of Systems: As mentioned in the history of present illness. All other systems reviewed and are negative. Past Medical History:  Diagnosis Date   (HFpEF) heart failure with preserved ejection fraction ([place])    a. 2017 Echo: EF 50%; b. 06/2018 Echo: EF 50-55%; c. 08/2018 Echo: EF 50-55%, Nl RV fxn; d. 09/2019 Echo: EF 55-60%, no rwma, mild LVH, Gr1 DD, nl RV size/fxn, PASP 63.50mmHg. Mildly dil LA. Triv MR. Mod AS (AoV 0.94cm^2 VTI; mean grad 17.55mmHg).   Acute on chronic respiratory failure with hypoxia and hypercapnia ([facility]) 01/07/2015   Anemia    Asterixis 01/07/2015   Asthma     Cataract    CKD (chronic kidney disease), stage III ([facility])    COPD (chronic obstructive pulmonary disease) ([place])    a. 06/2018 tobacco use, home 3L oxygen    Diabetes mellitus without complication ([place])    a. 09/2019 A1C 8.8   Edema, peripheral 04/20/2014   GI bleed 06/28/2019   History of kidney stones    Hyperlipidemia    Hypertension    Iron deficiency anemia 06/22/2014   Junctional bradycardia    a. In setting of beta blocker therapy.   Leucocytosis 10/19/2015   Moderate aortic stenosis    a.  09/2019 Echo: Mod AS (AoV 0.94cm^2 VTI; mean grad 17.27mmHg).   Morbid obesity ([place])    Overactive bladder    Primary osteoarthritis of right knee 09/01/2016   Sciatica 01/07/2015   Past Surgical History:  Procedure Laterality Date   APPENDECTOMY     CESAREAN SECTION     x3   CHOLECYSTECTOMY     COLONOSCOPY WITH PROPOFOL N/A 08/28/2017   Procedure: COLONOSCOPY WITH PROPOFOL;  Surgeon: Lucilla Lame, MD;  Location: [facility] ENDOSCOPY;  Service: Endoscopy;  Laterality: N/A;   COLONOSCOPY WITH PROPOFOL N/A 08/29/2017   Procedure: COLONOSCOPY WITH PROPOFOL;  Surgeon: Lucilla Lame, MD;  Location: [facility] ENDOSCOPY;  Service: Endoscopy;  Laterality: N/A;   CYSTOSCOPY W/ URETERAL STENT PLACEMENT Right 09/15/2017   Procedure: CYSTOSCOPY WITH RETROGRADE PYELOGRAM/URETERAL STENT PLACEMENT;  Surgeon: Cleon Gustin, MD;  Location: [facility] ORS;  Service: Urology;  Laterality: Right;   CYSTOSCOPY/URETEROSCOPY/HOLMIUM LASER/STENT PLACEMENT Right  10/09/2017   Procedure: CYSTOSCOPY/URETEROSCOPY/HOLMIUM LASER/STENT PLACEMENT;  Surgeon: Abbie Sons, MD;  Location: [facility] ORS;  Service: Urology;  Laterality: Right;  right Stent exchange   ESOPHAGOGASTRODUODENOSCOPY (EGD) WITH PROPOFOL N/A 09/16/2018   Procedure: ESOPHAGOGASTRODUODENOSCOPY (EGD) WITH PROPOFOL;  Surgeon: Lin Landsman, MD;  Location: [facility];  Service: Gastroenterology;  Laterality: N/A;   EYE SURGERY     IR THORACENTESIS ASP PLEURAL SPACE  W/IMG GUIDE  02/07/2021   Social History:  reports that she quit smoking about 28 years ago. Her smoking use included cigarettes. She has a 20.00 pack-year smoking history. She has never used smokeless tobacco. She reports that she does not drink alcohol and does not use drugs.  Allergies  Allergen Reactions   Ace Inhibitors Hives   Beta Adrenergic Blockers     Junctional bradycardia   Gabapentin Hives   Lisinopril Hives   Lyrica [Pregabalin] Hives   Shrimp [Shellfish Allergy] Swelling    Swelling of the lips    Family History  Problem Relation Age of Onset   Other Mother        unknown medical history   Other Father        unknown medical history    Prior to Admission medications   Medication Sig Start Date End Date Taking? Authorizing Provider  albuterol (VENTOLIN HFA) 108 (90 Base) MCG/ACT inhaler Inhale 2 puffs into the lungs every 6 (six) hours as needed for wheezing or shortness of breath.    Yes [provider]  allopurinol (ZYLOPRIM) 100 MG tablet Take 1 tablet by mouth daily.   Yes [provider]  apixaban (ELIQUIS) 5 MG TABS tablet Take 1 tablet by mouth twice daily 04/06/21  Yes Gollan, Kathlene November, MD  esomeprazole (NEXIUM) 40 MG capsule Take 40 mg by mouth daily. 12/17/18  Yes [provider]  Ferrous Sulfate (IRON) 325 (65 Fe) MG TABS Take 1 tablet (325 mg total) by mouth daily. 02/12/21  Yes Wieting, Richard, MD  furosemide (LASIX) 20 MG tablet Take 2 tablets (40 mg total) by mouth 2 (two) times daily. Patient taking differently: Take 40 mg by mouth 2 (two) times daily. She is taking 40 mg in am and 40 mg 2:00 pm 05/13/21  Yes Gollan, Kathlene November, MD  insulin glargine (LANTUS SOLOSTAR) 100 UNIT/ML Solostar Pen Inject 45 Units into the skin daily. 06/03/21  Yes Jennye Boroughs, MD  insulin lispro (HUMALOG) 100 UNIT/ML KwikPen Inject 10 Units into the skin in the morning, at noon, and at bedtime. 10 units with meals plus additional units for correction  01/22/21  Yes [provider]  losartan (COZAAR) 50 MG tablet Take 50 mg by mouth daily. 01/16/21  Yes [provider]  melatonin 5 MG TABS Take 5 mg by mouth at bedtime. 11/10/20  Yes [provider]  metFORMIN (GLUCOPHAGE) 500 MG tablet Take 500 mg by mouth 2 (two) times daily. 04/16/21  Yes [provider]  montelukast (SINGULAIR) 10 MG tablet Take 10 mg by mouth at bedtime.   Yes [provider]  senna-docusate (SENOKOT-S) 8.6-50 MG tablet Take 1 tablet by mouth 2 (two) times daily between meals as needed for mild constipation. 02/05/21  Yes Mercy Riding, MD  traZODone (DESYREL) 50 MG tablet Take 1 tablet (50 mg total) by mouth at bedtime as needed for sleep. 02/12/21  Yes Wieting, Richard, MD  vitamin B-12 (CYANOCOBALAMIN) 500 MCG tablet Take 500 mcg by mouth daily.   Yes [provider]  VITAMIN  D, CHOLECALCIFEROL, PO Take 1 tablet by mouth daily.   Yes [provider]  acetaminophen (TYLENOL) 500 MG tablet Take 1-2 tablets (500-1,000 mg total) by mouth every 6 (six) hours as needed for mild pain, fever, moderate pain or headache. Do not take more than 4 grams a day 02/05/21   Mercy Riding, MD  atorvastatin (LIPITOR) 10 MG tablet Take 1 tablet by mouth daily. 12/17/20   [provider]  Dulaglutide 3 MG/0.5ML SOPN Inject 0.5 mLs into the skin once a week. 04/11/21   [provider]  fluticasone (FLONASE) 50 MCG/ACT nasal spray Place 2 sprays into both nostrils daily. 11/15/18   [provider]  Fluticasone-Umeclidin-Vilant 100-62.5-25 MCG/INH AEPB Inhale 1 puff into the lungs daily. 12/25/17   [provider]  ipratropium (ATROVENT) 0.06 % nasal spray Place 2 sprays into both nostrils 4 (four) times daily. 05/28/20   Margarette Canada, NP  ipratropium-albuterol (DUONEB) 0.5-2.5 (3) MG/3ML SOLN Take 3 mLs by nebulization 4 (four) times daily as needed. 10/20/20   [provider]    Physical  Exam: Vitals:   08/03/21 1237 08/03/21 1244 08/03/21 1535 08/03/21 1545  BP: (!) 109/50  122/65   Pulse: (!) 59  (!) 51 63  Resp:   18 19  Temp: 98.4 F (36.9 C)     TempSrc: Oral     SpO2: 94%  97% 99%  Weight:  117.9 kg    Height:  5\' 3"  (1.6 m)     General:  Appears calm and comfortable and is in NAD Cardiovascular:  RRR, no m/r/g.  Respiratory: Diminished breath sounds.  Normal respiratory effort. Abdomen:  soft, NT, ND, NABS Skin:  no rash or induration seen on limited exam Musculoskeletal:  grossly normal tone BUE/BLE, good ROM, no bony abnormality Lower extremity: Trace edema bilateral lower extremities.  Limited foot exam with no ulcerations.  2+ distal pulses. Psychiatric:  grossly normal mood and affect, speech fluent and appropriate, AOx3 Neurologic:  CN 2-12 grossly intact, moves all extremities in coordinated fashion, sensation intact  Data Reviewed: Sodium 127, chloride 89, creatinine 1.06, WBC 20, hemoglobin 10.3, hematocrit 31.3 Chest x-ray cardiomegaly, central vascular congestion and possible interstitial edema  Assessment and Plan: Exertional dyspnea: Secondary to diastolic CHF exacerbation.  Follows with Dr. Rockey Situ.  We will consult them.  Start IV Lasix.  Hold home Lasix.  Last echo 12/22 showed LVEF 60 to 65% with grade 1 diastolic dysfunction.  Strict I's and O's, daily weights, fluid restriction.  Chest pain: Troponins negative. EKG shows no acute ischemic changes. ECHO to rule out RWMA.  Hypervolemic hyponatremia: Should improve with diuresis.  Repeat BMP in the morning.  Hypertension: Continue home Cozaar  IDDM T2: Poorly controlled.  Hemoglobin A1c 10.2 on 05/28/2021.  Hold home metformin and dulaglutide.  The patient is on Lantus 45 units daily and Humalog 10 units 3 times daily AC.  We will give half her home regimen here and place on SSI AC and HS.  Morbid obesity: Complicates care.  BMI 46.  COPD: Not in acute exacerbation.  Continue home  Trelegy  PAF: Continue home Eliquis  Hyperlipidemia: Continue home Lipitor  Gout: Continue home allopurinol  Insomnia: Continue home trazodone and melatonin   Advance Care Planning: DNR  Consults: Cardiology  Family Communication: No family present at bedside  Severity of Illness: The appropriate patient status for this patient is INPATIENT. Inpatient status is judged to be reasonable and necessary in order to provide the  required intensity of service to ensure the patient's safety. The patient's presenting symptoms, physical exam findings, and initial radiographic and laboratory data in the context of their chronic comorbidities is felt to place them at high risk for further clinical deterioration. Furthermore, it is not anticipated that the patient will be medically stable for discharge from the hospital within 2 midnights of admission.   * I certify that at the point of admission it is my clinical judgment that the patient will require inpatient hospital care spanning beyond 2 midnights from the point of admission due to high intensity of service, high risk for further deterioration and high frequency of surveillance required.*  Author: Leslee Home, DO 08/03/2021 5:18 PM  For on call review [email or web address].

## 2021-08-03 NOTE — ED Provider Notes (Signed)
[facility] Provider Note    Event Date/Time   First MD Initiated Contact with Patient 08/03/21 1520     (approximate)   History   Shortness of Breath   HPI  Karen [name] is a 76 y.o. female is a DNR with advanced CHF on 3 L nasal cannula chronically not on home diuretics managed with the heart failure clinic presents to the ER for evaluation of worsening shortness of breath and exertional dyspnea to the point she is no longer able to perform her ADLs.  Usually gets around with a walker but able to do so.  She states that she has gained "40 pounds overnight" denies missing any doses of medications.  On review of recent hospitalization her discharge weight was 94 kg today she is 118 kg.     Physical Exam   Triage Vital Signs: ED Triage Vitals  Enc Vitals Group     BP 08/03/21 1237 (!) 109/50     Pulse Rate 08/03/21 1237 (!) 59     Resp --      Temp 08/03/21 1237 98.4 F (36.9 C)     Temp Source 08/03/21 1237 Oral     SpO2 08/03/21 1237 94 %     Weight 08/03/21 1244 260 lb (117.9 kg)     Height 08/03/21 1244 5\' 3"  (1.6 m)     Head Circumference --      Peak Flow --      Pain Score 08/03/21 1243 10     Pain Loc --      Pain Edu? --      Excl. in [place]? --     Most recent vital signs: Vitals:   08/03/21 1535 08/03/21 1545  BP: 122/65   Pulse: (!) 51 63  Resp: 18 19  Temp:    SpO2: 97% 99%     Constitutional: Alert  Eyes: Conjunctivae are normal.  Head: Atraumatic. Nose: No congestion/rhinnorhea. Mouth/Throat: Mucous membranes are moist.   Neck: Painless ROM.  Cardiovascular:   Good peripheral circulation. Normal rate and rhythm Respiratory: tachypnea with diminished posterior bs Gastrointestinal: Soft and nontender.  Musculoskeletal:  no deformity. Ble pitting edema Neurologic:  MAE spontaneously. No gross focal neurologic deficits are appreciated.  Skin:  Skin is warm, dry and intact. No rash noted. Psychiatric: Mood and affect  are normal. Speech and behavior are normal.    ED Results / Procedures / Treatments   Labs (all labs ordered are listed, but only abnormal results are displayed) Labs Reviewed  BASIC METABOLIC PANEL - Abnormal; Notable for the following components:      Result Value   Sodium 127 (*)    Chloride 89 (*)    Glucose, Bld 189 (*)    BUN 46 (*)    Creatinine, Ser 1.06 (*)    GFR, Estimated 54 (*)    All other components within normal limits  CBC - Abnormal; Notable for the following components:   WBC 20.0 (*)    RBC 3.56 (*)    Hemoglobin 10.3 (*)    HCT 31.3 (*)    All other components within normal limits  BRAIN NATRIURETIC PEPTIDE  URINALYSIS, ROUTINE W REFLEX MICROSCOPIC  TROPONIN I (HIGH SENSITIVITY)  TROPONIN I (HIGH SENSITIVITY)     EKG  ED ECG REPORT I, Merlyn Lot, the attending physician, personally viewed and interpreted this ECG.   Date: 08/03/2021  EKG Time: 12:42  Rate: 64  Rhythm: sinus  Axis: normal  Intervals:normal qt  ST&T Change: no stemi, no depressions    RADIOLOGY Please see ED Course for my review and interpretation.  I personally reviewed all radiographic images ordered to evaluate for the above acute complaints and reviewed radiology reports and findings.  These findings were personally discussed with the patient.  Please see medical record for radiology report.    PROCEDURES:  Critical Care performed: Yes, see critical care procedure note(s)  .Critical Care Performed by: Merlyn Lot, MD Authorized by: Merlyn Lot, MD   Critical care provider statement:    Critical care time (minutes):  35   Critical care was necessary to treat or prevent imminent or life-threatening deterioration of the following conditions:  Cardiac failure   Critical care was time spent personally by me on the following activities:  Ordering and performing treatments and interventions, ordering and review of laboratory studies, ordering and review  of radiographic studies, pulse oximetry, re-evaluation of patient's condition, review of old charts, obtaining history from patient or surrogate, examination of patient, evaluation of patient's response to treatment, discussions with primary provider, discussions with consultants and development of treatment plan with patient or surrogate   MEDICATIONS ORDERED IN ED: Medications  furosemide (LASIX) injection 60 mg (60 mg Intravenous Given 08/03/21 1550)     IMPRESSION / MDM / [place] / ED COURSE  I reviewed the triage vital signs and the nursing notes.                              Differential diagnosis includes, but is not limited to, Asthma, copd, CHF, pna, ptx, malignancy, Pe, anemia  Patient presented to the ER for evaluation of symptoms as described above with significant weight gain volume overload despite outpatient diuresis.  Given her comorbidities and acute shortness of breath with hypoxia this presenting complaint could reflect a potentially life-threatening illness therefore the patient will be placed on continuous pulse oximetry and telemetry for monitoring.  Laboratory evaluation will be sent to evaluate for the above complaints.  Chest x-ray ordered for the above differential    Clinical Course as of 08/03/21 1602  Wed Aug 03, 2021  1531 Patient with mild hyponatremia troponin is normal significant leukocytosis [PR]  1533 She is a febrile denies any productive cough no abdominal pain.  She is not endorsing any dysuria or flank pain no rashes. [PR]  6384 Chest x-ray on my interpretation does not show any evidence of consolidation or pneumothorax. [PR]  6659 Given the patient's acute on chronic CHF failing outpatient management significant exertional dyspnea and unable to care for self at home have consulted hospitalist for admission. [PR]    Clinical Course User Index [PR] Merlyn Lot, MD    Patient's presentation is most consistent with severe  exacerbation of chronic illness.   FINAL CLINICAL IMPRESSION(S) / ED DIAGNOSES   Final diagnoses:  Acute on chronic congestive heart failure, unspecified heart failure type ([place])     Rx / DC Orders   ED Discharge Orders     None        Note:  This document was prepared using Dragon voice recognition software and may include unintentional dictation errors.    Merlyn Lot, MD 08/03/21 [phone]

## 2021-08-04 ENCOUNTER — Inpatient Hospital Stay (HOSPITAL_COMMUNITY)
Admit: 2021-08-04 | Discharge: 2021-08-04 | Disposition: A | Discharge status: Home / Self Care | Payer: Medicare Other | Attending: Family Medicine | Admitting: Family Medicine

## 2021-08-04 ENCOUNTER — Encounter: Payer: Self-pay | Admitting: Family Medicine

## 2021-08-04 DIAGNOSIS — I5033 Acute on chronic diastolic (congestive) heart failure: Secondary | ICD-10-CM | POA: Diagnosis not present

## 2021-08-04 DIAGNOSIS — N189 Chronic kidney disease, unspecified: Secondary | ICD-10-CM

## 2021-08-04 DIAGNOSIS — N179 Acute kidney failure, unspecified: Secondary | ICD-10-CM | POA: Diagnosis not present

## 2021-08-04 DIAGNOSIS — I480 Paroxysmal atrial fibrillation: Secondary | ICD-10-CM | POA: Diagnosis not present

## 2021-08-04 DIAGNOSIS — J9611 Chronic respiratory failure with hypoxia: Secondary | ICD-10-CM | POA: Diagnosis not present

## 2021-08-04 DIAGNOSIS — I272 Pulmonary hypertension, unspecified: Secondary | ICD-10-CM

## 2021-08-04 DIAGNOSIS — R079 Chest pain, unspecified: Secondary | ICD-10-CM

## 2021-08-04 DIAGNOSIS — G4733 Obstructive sleep apnea (adult) (pediatric): Secondary | ICD-10-CM

## 2021-08-04 LAB — BASIC METABOLIC PANEL
Anion gap: 9 (ref 5–15)
BUN: 51 mg/dL — ABNORMAL HIGH (ref 8–23)
CO2: 34 mmol/L — ABNORMAL HIGH (ref 22–32)
Calcium: 8.9 mg/dL (ref 8.9–10.3)
Chloride: 87 mmol/L — ABNORMAL LOW (ref 98–111)
Creatinine, Ser: 1.46 mg/dL — ABNORMAL HIGH (ref 0.44–1.00)
GFR, Estimated: 37 mL/min — ABNORMAL LOW (ref >60)
Glucose, Bld: 282 mg/dL — ABNORMAL HIGH (ref 70–99)
Potassium: 3.7 mmol/L (ref 3.5–5.1)
Sodium: 130 mmol/L — ABNORMAL LOW (ref 135–145)

## 2021-08-04 LAB — ECHOCARDIOGRAM COMPLETE
AR max vel: 1.15 cm2
AV Area VTI: 1.2 cm2
AV Area mean vel: 1.04 cm2
AV Mean grad: 10.3 mmHg
AV Peak grad: 17.3 mmHg
Ao pk vel: 2.08 m/s
Area-P 1/2: 3.39 cm2
Height: 63 in
MV VTI: 1.25 cm2
S' Lateral: 2.9 cm
Weight: 4160 oz

## 2021-08-04 LAB — CBC
HCT: 29.8 % — ABNORMAL LOW (ref 36.0–46.0)
Hemoglobin: 9.4 g/dL — ABNORMAL LOW (ref 12.0–15.0)
MCH: 28.4 pg (ref 26.0–34.0)
MCHC: 31.5 g/dL (ref 30.0–36.0)
MCV: 90 fL (ref 80.0–100.0)
Platelets: 250 10*3/uL (ref 150–400)
RBC: 3.31 MIL/uL — ABNORMAL LOW (ref 3.87–5.11)
RDW: 15.1 % (ref 11.5–15.5)
WBC: 14.1 10*3/uL — ABNORMAL HIGH (ref 4.0–10.5)
nRBC: 0 % (ref 0.0–0.2)

## 2021-08-04 LAB — CBG MONITORING, ED
Glucose-Capillary: 307 mg/dL — ABNORMAL HIGH (ref 70–99)
Glucose-Capillary: 366 mg/dL — ABNORMAL HIGH (ref 70–99)

## 2021-08-04 LAB — GLUCOSE, CAPILLARY
Glucose-Capillary: 198 mg/dL — ABNORMAL HIGH (ref 70–99)
Glucose-Capillary: 268 mg/dL — ABNORMAL HIGH (ref 70–99)

## 2021-08-04 MED ORDER — PERFLUTREN LIPID MICROSPHERE
1.0000 mL | INTRAVENOUS | Status: AC | PRN
  Administered 2021-08-04: 3 mL via INTRAVENOUS

## 2021-08-04 MED ORDER — SODIUM CHLORIDE 0.9 % IV SOLN: INTRAVENOUS | Status: DC

## 2021-08-04 MED ORDER — INSULIN GLARGINE-YFGN 100 UNIT/ML ~~LOC~~ SOLN
10.0000 [IU] | SUBCUTANEOUS | Status: AC
  Administered 2021-08-04: 10 [IU] via SUBCUTANEOUS
  Filled 2021-08-04: qty 0.1

## 2021-08-04 NOTE — Assessment & Plan Note (Addendum)
Slowly trending up her Lantus.  She normally takes 45 units once a day.  She is currently on 12 in the morning and 25 in the evening.

## 2021-08-04 NOTE — Progress Notes (Signed)
Admission profile updated. ?

## 2021-08-04 NOTE — Assessment & Plan Note (Addendum)
History of stage IIIa chronic kidney disease.  Creatinine on admission at 1.06 with GFR 54 but following Lasix, and peaked as high as 1.46, likely from hypotension, but since then has improved, creatinine currently at 1.1  Lab Results  Component Value Date   CREATININE 1.10 (H) 08/07/2021   CREATININE 1.03 (H) 08/06/2021   CREATININE 1.01 (H) 08/05/2021

## 2021-08-04 NOTE — Assessment & Plan Note (Addendum)
Contributing factor to her CHF decompensation.  Patient has been on CPAP here, but was supposed to get an outpatient sleep study and CPAP approval at home, but due to delays, not yet done.  Discussed with case management and we are trying to get patient approved for trilogy device.  If accepted, device should be delivered to patient's home before she has been discharged from skilled nursing rehab.

## 2021-08-04 NOTE — Consult Note (Signed)
Cardiology Consult    Patient ID: Karen [name] MRN: [id number], DOB/AGE: 1945-09-22   Admit date: 08/03/2021 Date of Consult: 08/04/2021  Primary Physician: Margarita Rana, MD Primary Cardiologist: Ida Rogue, MD Requesting Provider: Lyman Speller, MD  Patient Profile    Karen [name] is a 76 y.o. female with a history of HFpEF, severe pulmonary hypertension, moderate aortic stenosis, junctional bradycardia with BB/CCB, PAF by 03/2019 monitor, chronic chest pain, GI bleed, Covid in 10/2020, DM2, HTN, HLD, chronic hypoxic respiratory failure on supplemental oxygen, COPD, anemia, and obesity, who is being seen today for the evaluation of acute on chronic HFpEF at the request of Dr. [place] Pink.  Past Medical History   Past Medical History:  Diagnosis Date   (HFpEF) heart failure with preserved ejection fraction ([place])    a. 2017 Echo: EF 50%; b. 06/2018 Echo: EF 50-55%; c. 08/2018 Echo: EF 50-55%, Nl RV fxn; d. 09/2019 Echo: EF 55-60%, no rwma, mild LVH, Gr1 DD, nl RV size/fxn, PASP 63.68mmHg. Mildly dil LA. Triv MR. Mod AS (AoV 0.94cm^2 VTI; mean grad 17.20mmHg); d. 02/2021 Echo: EF 60-65%, no rwma, GrI DD, mildly red RV fxn, RVSP 64.49mmHg, mild-mod MR, mod-sev TR, mild AS.   Acute on chronic respiratory failure with hypoxia and hypercapnia (HCC) 01/07/2015   Anemia    Asterixis 01/07/2015   Asthma    Cataract    CKD (chronic kidney disease), stage III (HCC)    COPD (chronic obstructive pulmonary disease) ([place])    a. 06/2018 tobacco use, home 3L oxygen    Diabetes mellitus without complication ([place])    a. 09/2019 A1C 8.8   Edema, peripheral 04/20/2014   GI bleed 06/28/2019   History of kidney stones    Hyperlipidemia    Hypertension    Iron deficiency anemia 06/22/2014   Junctional bradycardia    a. In setting of beta blocker therapy.   Leucocytosis 10/19/2015   Moderate aortic stenosis    a.  09/2019 Echo: Mod AS (AoV 0.94cm^2 VTI; mean grad 17.72mmHg); b. 02/2021 Echo: Mild AS.    Morbid obesity ([place])    Overactive bladder    Primary osteoarthritis of right knee 09/01/2016   Sciatica 01/07/2015   Valvular heart disease    a. 02/2021 Echo: EF 60-65%, no rwma, GrI DD, mild-mod MR, mod-sev TR, mild AS    Past Surgical History:  Procedure Laterality Date   APPENDECTOMY     CESAREAN SECTION     x3   CHOLECYSTECTOMY     COLONOSCOPY WITH PROPOFOL N/A 08/28/2017   Procedure: COLONOSCOPY WITH PROPOFOL;  Surgeon: Lucilla Lame, MD;  Location: [facility] ENDOSCOPY;  Service: Endoscopy;  Laterality: N/A;   COLONOSCOPY WITH PROPOFOL N/A 08/29/2017   Procedure: COLONOSCOPY WITH PROPOFOL;  Surgeon: Lucilla Lame, MD;  Location: [facility] ENDOSCOPY;  Service: Endoscopy;  Laterality: N/A;   CYSTOSCOPY W/ URETERAL STENT PLACEMENT Right 09/15/2017   Procedure: CYSTOSCOPY WITH RETROGRADE PYELOGRAM/URETERAL STENT PLACEMENT;  Surgeon: Cleon Gustin, MD;  Location: [facility] ORS;  Service: Urology;  Laterality: Right;   CYSTOSCOPY/URETEROSCOPY/HOLMIUM LASER/STENT PLACEMENT Right 10/09/2017   Procedure: CYSTOSCOPY/URETEROSCOPY/HOLMIUM LASER/STENT PLACEMENT;  Surgeon: Abbie Sons, MD;  Location: [facility] ORS;  Service: Urology;  Laterality: Right;  right Stent exchange   ESOPHAGOGASTRODUODENOSCOPY (EGD) WITH PROPOFOL N/A 09/16/2018   Procedure: ESOPHAGOGASTRODUODENOSCOPY (EGD) WITH PROPOFOL;  Surgeon: Lin Landsman, MD;  Location: [place];  Service: Gastroenterology;  Laterality: N/A;   EYE SURGERY     IR THORACENTESIS ASP PLEURAL SPACE W/IMG GUIDE  02/07/2021     Allergies  Allergies  Allergen Reactions   Ace Inhibitors Hives   Beta Adrenergic Blockers     Junctional bradycardia   Gabapentin Hives   Lisinopril Hives   Lyrica [Pregabalin] Hives   Shrimp [Shellfish Allergy] Swelling    Swelling of the lips    History of Present Illness    76 y.o. female with a history of HFpEF, severe pulmonary hypertension, moderate aortic stenosis, junctional bradycardia with BB/CCB, PAF by  03/2019 monitor, chronic chest pain, GI bleed, Covid in 10/2020, DM2, HTN, HLD, chronic hypoxic respiratory failure on supplemental oxygen, COPD, anemia, and obesity.  She was prev admitted w/ junctional brady and hypotension in 08/2018 in the setting of bisprolol therapy.  This was d/c'd and she required a brief course of dopamine.  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  She was readmitted in 02/2019 w/ junctional bradycardia in the setting of ongoing bisoprolol therapy, despite her being advised to d/c it previously.  In 02/2021, she underwent event monitoring in the setting of palpitations, and this showed PAF.  Eliquis was started.  In late Nov 2022, she was admitted to [facility] w/ resp failure and HFpEF/PAH complicated by E coli UTI and encephalopathy.  Echo @ that time showed an EF of 60-65%, no rwma, GrI DD, mildly reduced RV fxn, PAH (RVSP 64.54mmHg), mild-mod MR, mod-severe TR, and mild AS.  She was also noted to have a moderate R pl effusion and underwent thoracentesis w/ 600 ml removed.  She required aggressive diuresis throughout admission (minus 10L), and was subsequently d/c'd.  She was readmitted in late March 2023 w/ recurrent volume overload, and was last seen in CHF clinic on 5/4, at which time her weight/volume were felt to be stable (wt 211 lbs w/ reported dry wt of 204-207 lbs on home scale).  Karen [name] is followed by the local paramedicine team.  She notes that she had been doing well following her March admission with a weight of approximately 206 to 208 pounds on her home scale.  Without any known change in diet or sodium intake, and without missing any medications, she had sudden weight gain to 214 pounds on May 31, prompting her husband to call the paramedics and provider.  She was also reported to be hypertensive and dyspneic, and she was advised to call EMS.  She was transported to the [facility] ED  where ECG showed sinus arrhythmia @ 64 and early repolarization.  Labs notable for sl elevation of BUN/Creat @ 46/1.06  51/1.46 this AM after lasix 60mg  IV x 1 on 5/31, Na 127  130, Cl 89  87, normocytic anemia w/ H/H of 10.3/31.3  9.4/29.8, BNP 310.4, and Nl HsTrop x 2.  CXR w/ cardiomegaly and central pulm vasc congestion and suspected interstitial edema.  She notes good response to IV Lasix but continues to feel short of breath and notes increased abdominal girth.  She also reports that she had mild edema prior to admission.  She denies chest pain.  Inpatient Medications     allopurinol  100 mg Oral Daily   apixaban  5 mg Oral BID   atorvastatin  10 mg Oral Daily   ferrous sulfate  325 mg Oral Q breakfast   fluticasone furoate-vilanterol  1 puff Inhalation Daily   And   umeclidinium bromide  1 puff Inhalation Daily   furosemide  40  mg Intravenous BID   insulin aspart  0-20 Units Subcutaneous TID WC   insulin aspart  0-5 Units Subcutaneous QHS   insulin aspart  5 Units Subcutaneous TID WC   insulin glargine-yfgn  22 Units Subcutaneous QHS   losartan  50 mg Oral Daily   melatonin  5 mg Oral QHS   montelukast  10 mg Oral QHS   pantoprazole  80 mg Oral Q1200   vitamin B-12  500 mcg Oral Daily    Family History    Family History  Problem Relation Age of Onset   Other Mother        unknown medical history   Other Father        unknown medical history   She indicated that her mother is deceased. She indicated that her father is deceased.   Social History    Social History   Socioeconomic History   Marital status: Married    Spouse name: Not on file   Number of children: Not on file   Years of education: Not on file   Highest education level: Not on file  Occupational History   Not on file  Tobacco Use   Smoking status: Former    Packs/day: 1.00    Years: 20.00    Pack years: 20.00    Types: Cigarettes    Quit date: 12/04/1992    Years since quitting: 28.6   Smokeless  tobacco: Never  Vaping Use   Vaping Use: Never used  Substance and Sexual Activity   Alcohol use: No   Drug use: No   Sexual activity: Not Currently  Other Topics Concern   Not on file  Social History Narrative   Not on file   Social Determinants of Health   Financial Resource Strain: Not on file  Food Insecurity: Not on file  Transportation Needs: Not on file  Physical Activity: Not on file  Stress: Not on file  Social Connections: Not on file  Intimate Partner Violence: Not on file     Review of Systems    General:  No chills, fever, night sweats or weight changes.  Cardiovascular:  No chest pain, +++ dyspnea on exertion, +++ mild lower extremity edema, +++ orthopnea, no palpitations, paroxysmal nocturnal dyspnea, +++ increase in abdominal girth. Dermatological: No rash, lesions/masses Respiratory: No cough, +++ dyspnea Urologic: No hematuria, dysuria Abdominal:   +++ Increasing abdominal girth.  No nausea, vomiting, diarrhea, bright red blood per rectum, melena, or hematemesis Neurologic:  No visual changes, wkns, changes in mental status. All other systems reviewed and are otherwise negative except as noted above.  Physical Exam    Blood pressure (!) 96/43, pulse 60, temperature 98.1 F (36.7 C), temperature source Oral, resp. rate 19, height 5\' 3"  (1.6 m), weight 117.9 kg, SpO2 97 %.  General: Pleasant, NAD Psych: Normal affect. Neuro: Alert and oriented X 3. Moves all extremities spontaneously. HEENT: Normal  Neck: Supple without bruits.  Mod elev JVP. Lungs:  Resp regular and unlabored, bibasilar crackles. Heart: RRR no s3, s4, 2/6 syst murmur heard throughout. Abdomen: Obese, protuberant, semifirm, nontender, BS + x 4.  Extremities: No clubbing, cyanosis or edema. DP/PT2+, Radials 2+ and equal bilaterally.  Labs    Cardiac Enzymes Recent Labs  Lab 08/03/21 1324 08/03/21 1549  TROPONINIHS 11 10     BNP    Component Value Date/Time   BNP 310.4 (H)  08/03/2021 1549    Lab Results  Component Value Date   WBC  14.1 (H) 08/04/2021   HGB 9.4 (L) 08/04/2021   HCT 29.8 (L) 08/04/2021   MCV 90.0 08/04/2021   PLT 250 08/04/2021    Recent Labs  Lab 08/04/21 0617  NA 130*  K 3.7  CL 87*  CO2 34*  BUN 51*  CREATININE 1.46*  CALCIUM 8.9  GLUCOSE 282*    Lab Results  Component Value Date   DDIMER 0.77 (H) 02/08/2021      Radiology Studies    DG Chest 2 View  Result Date: 08/03/2021 CLINICAL DATA:  Provided history: Shortness of breath. Additional history provided: Patient reports significant weight gain overnight, chest pain, shortness of breath. EXAM: CHEST - 2 VIEW COMPARISON:  Prior chest radiographs 05/28/2021 and earlier. CT angiogram chest 02/05/2021. FINDINGS: Cardiomegaly, unchanged. Aortic atherosclerosis. Central pulmonary vascular congestion. Prominence of the interstitial lung markings in the perihilar regions and lung bases, which is suspicious for interstitial edema. No appreciable airspace consolidation. No evidence of pleural effusion or pneumothorax. No acute bony abnormality identified IMPRESSION: Cardiomegaly with central pulmonary vascular congestion and suspected interstitial edema. Aortic Atherosclerosis (ICD10-I70.0). Electronically Signed   By: Kellie Simmering D.O.   On: 08/03/2021 13:05    ECG & Cardiac Imaging    Sinus arrhythmia, 64, early repol - personally reviewed.  Assessment & Plan    1.  Acute on chronic heart failure with preserved ejection fraction/pulmonary arterial hypertension: Patient with a history of HFpEF and valvular heart disease with an EF of 60 to 65%, mild aortic stenosis, mild to moderate MR, and moderate to severe TR by echo in December 2022.  She had been doing well at home following hospitalization in March of this year however, had sudden 8 pound weight gain on March 31 associated with increasing abdominal girth, mild lower extremity edema, dyspnea, and orthopnea, prompting call to  paramedics and subsequently EMS.  Here, ECG without acute findings.  Chest x-ray showed cardiomegaly and central pulmonary vascular congestion/suspected interstitial edema.  She has been treated with intravenous Lasix with slight rise in creatinine to 1.46 (BUN 51, bicarb 34).  She remains volume overloaded on examination.  Recommend ongoing diuresis as planned with close monitoring of renal function.  Heart rate and blood pressure stable.  Will consider MRA and SGLT2 inhibitor prior to discharge.  2.  Essential hypertension: Stable.  3.  Hyperlipidemia: She remains on atorvastatin therapy.  4.  Paroxysmal atrial fibrillation: Maintaining sinus rhythm.  No AV nodal blocking agents in the setting of profound junctional bradycardia while on bisoprolol in the past.  She is anticoagulated with Eliquis.  5.  Stage III chronic kidney disease: Followed closely with diuresis.  6.  Chronic hypoxic respiratory failure: On home O2.  Management per internal medicine.  7.  Type 2 diabetes mellitus: Per internal medicine.  Risk Assessment/Risk Scores:        New York Heart Association (NYHA) Functional Class NYHA Class IV    Signed, Murray Hodgkins, NP 08/04/2021, 1:27 PM  For questions or updates, please contact   Please consult [email or web address] for contact info under Cardiology/STEMI.

## 2021-08-04 NOTE — Assessment & Plan Note (Addendum)
As above.

## 2021-08-04 NOTE — Assessment & Plan Note (Signed)
127 on admission.  In the setting of hypervolemia, resolved with diuresis

## 2021-08-04 NOTE — Care Management Important Message (Signed)
Important Message  Patient Details  Name: Karen [name] MRN: [id number] Date of Birth: 18-Apr-1945   Medicare Important Message Given:  N/A - LOS <3 / Initial given by admissions     [name] 08/04/2021, 2:55 PM

## 2021-08-04 NOTE — ED Notes (Signed)
MD aware of current BP, in department.

## 2021-08-04 NOTE — Assessment & Plan Note (Addendum)
Rate controlled with Cardizem CD100 and 20 mg p.o. daily, on Eliquis for anticoagulation

## 2021-08-04 NOTE — ED Notes (Signed)
No response from ED yet. Paging MD via Network engineer.

## 2021-08-04 NOTE — Progress Notes (Signed)
*  PRELIMINARY RESULTS* Echocardiogram 2D Echocardiogram has been performed.  [name] [name] 08/04/2021, 4:10 PM

## 2021-08-04 NOTE — Consult Note (Signed)
   Heart Failure Nurse Navigator Note  HFpEF 60 to 65%.  Grade 1 diastolic dysfunction.  Right ventricular systolic function mildly reduced.  Right ventricle size is mildly enlarged.  Severely elevated pulmonary artery systolic pressures.  Mild to moderate mitral regurgitation.  Moderate to severe tricuspid regurgitation.  Mild aortic stenosis.  Presented from home by way of EMS for complaints of weight increased from 206 2 to 213.5 pounds, along with dyspnea.  Comorbidities:  Anemia Asthma COPD with home O2 use Hyperlipidemia Hypertension Morbid obesity Untreated sleep apnea  Labs:  Sodium 130, potassium 3.7, chloride 87, CO2 34, BUN 51, creatinine 1.46 up from 1.06 of yesterday.  GFR 37.  WBC 14.1, hemoglobin 9.4, hematocrit 29.8, platelet count 250. Weight is documented at 117.9 kg but patient states that she was not stood on a scale. Blood pressure 96/56.  Medications:  Furosemide 40 mg IV twice a day Apixaban 5 mg twice a day Atorvastatin 10 mg daily  Initial meeting with patient and her husband in the ED who was at the bedside.   Discussed her weights, he states the day before admission she weighed 206 pounds and the day of admission she weighed 213.5 pounds.  Discussed diet, she has been known to eat [employer] [employer] sausage biscuits for breakfast and other things that are higher in sodium.  Para medicine has mentioned when she visits that there is sweets in the home. Discussed examples of meals to  eat such as wheat toast and a scrambled egg for breakfast. Making wise choices.  She does not comply with the fluid restriction as he states when her blood sugar is up and she is thirsty, he states that he has been told by other physicians with the  elevated blood sugar that she needs to be taking in more water.  Discussed eating foods that are healthier and keeping her blood sugars under better control along with sticking with fluid restriction.  Husband states that she still needs  to have a sleep study scheduled as he has tried calling but not able to get through to get it scheduled.  I am looking into [employer] meals to see if they qualify with Medicare and also asking the dietitian to speak with both the patient and her husband.  Pricilla Riffle RN [facility]

## 2021-08-04 NOTE — Assessment & Plan Note (Addendum)
Looks to be stress margination.  White blood cell count dropped from 20 down to 12.2 without any antibiotic treatment.  No evidence of infection

## 2021-08-04 NOTE — Assessment & Plan Note (Addendum)
Meets criteria with BMI greater than 35 and comorbidity of CHF.

## 2021-08-04 NOTE — Assessment & Plan Note (Addendum)
Initially received Lasix, but had to hold due to soft blood pressures.  After blood pressure improved with gentle IV fluids, fluids stopped and started on Lasix and since then she has diuresed over 8 L.  Renal function has improved since admission.  Cardiology following and started Jardiance 6/2.  Follow-up echocardiogram notes grade 3 diastolic dysfunction and preserved ejection fraction with moderate tricuspid regurg and moderate pulmonary hypertension.   Net IO Since Admission: -8,758 mL [08/07/21 1258]   Cardiology have transitioned her to oral torsemide.  Started oral Cardizem CD and Aldactone.  Stopping losartan at discharge.  Cardiology has signed off

## 2021-08-04 NOTE — ED Notes (Signed)
Repositioned pt in bed. Pt reports "not feeling well," but is unable to explain what is bothering her. BP trending down. Secure chat sent to MD regarding same. Awaiting response.

## 2021-08-04 NOTE — Hospital Course (Addendum)
76 year old female with past medical history of diastolic CHF, morbid obesity, COPD respiratory failure on 3 L nasal cannula, obstructive sleep apnea not on CPAP, diabetes mellitus type 2 poorly controlled and paroxysmal atrial fibrillation presented to the emergency room with shortness of breath.  Patient called the emergency room physician that she had gained 40 pounds overnight and in review of her dry weight from last hospital discharge, this appeared to be correct.  Patient admitted to the hospitalist service and cardiology consulted.  Started on IV Lasix.    After some initial hypotension, patient has since improved, diuresing over 7 L and he is -6.4 L deficient.  Her blood sugars have also started to come down.  6/4: Cardiology transitioned to Cardizem, Aldactone and torsemide.  Losartan to be stopped at discharge

## 2021-08-04 NOTE — ED Notes (Signed)
MD in department to see pt.

## 2021-08-04 NOTE — Assessment & Plan Note (Addendum)
On baseline 3 L nasal cannula.

## 2021-08-04 NOTE — Progress Notes (Signed)
[facility] Progress Note  Patient: Karen [name]    YNW:[id number]  DOA: 08/03/2021    Date of Service: the patient was seen and examined on 08/04/2021  Brief hospital course: 76 year old female with past medical history of diastolic CHF, morbid obesity, COPD respiratory failure on 3 L nasal cannula, obstructive sleep apnea not on CPAP, diabetes mellitus type 2 poorly controlled and paroxysmal atrial fibrillation presented to the emergency room with shortness of breath.  Patient called the emergency room physician that she had gained 40 pounds overnight and in review of her dry weight from last hospital discharge, this appeared to be correct.  Patient admitted to the hospitalist service and cardiology consulted.  Started on IV Lasix.    By morning of 6/1, patient complaining of feeling slightly weak and dizzy.  Had some mild hypotension with heart rate of 52 and systolic blood pressure of 71.  Lasix stopped.  Patient did not receive her morning Coreg and started on some gentle IV fluids with blood pressures improving to a systolic in the 657Q.  Assessment and Plan: Assessment and Plan: Acute on chronic diastolic CHF (congestive heart failure) (HCC) Initially received Lasix, but had to hold due to soft blood pressures.  We will stop IV fluids for now and try gentle diuresis again tomorrow.  Monitor renal function.  Cardiology following.  Follow-up echocardiogram notes grade 3 diastolic dysfunction and preserved ejection fraction with moderate tricuspid regurg and moderate pulmonary hypertension.  We will see if she improves with diuresis and if not, possible right heart catheterization  Severe pulmonary hypertension ([place]) As above.  Chronic respiratory failure with hypoxia (HCC) On baseline 3 L nasal cannula  Acute kidney injury superimposed on CKD ([place]) History of stage IIIa chronic kidney disease.  Creatinine on admission at 1.06 with GFR 54 but following Lasix, creatinine today at 1.46  with GFR of 37.  Continue to follow.  OSA (obstructive sleep apnea) Contributing factor to her CHF decompensation.  Patient has been on CPAP here, but was supposed to get an outpatient sleep study and CPAP approval at home, but due to delays, not yet done  Uncontrolled type 2 diabetes mellitus with hyperglycemia, with long-term current use of insulin ([place]) Have increased Lantus which should help with her auto diuresis.  CBGs poorly controlled  AF (paroxysmal atrial fibrillation) (HCC) Rate controlled, on Eliquis  Hyponatremia 127 on admission.  In the setting of hypervolemia and improved with some diuresis.  Leucocytosis Looks to be stress margination.  White blood cell count dropped from 20-14 without any antibiotic treatment.  No evidence of urinary tract infection.  Anemia in chronic kidney disease Hemoglobin stable at 9.4  Morbid obesity (HCC) Meets criteria with BMI greater than 35 and comorbidity of CHF       Body mass index is 37.72 kg/m.        Consultants: Cardiology  Procedures: Echocardiogram done 6/1: Grade 3 diastolic dysfunction.  Moderate pulmonary hypertension and tricuspid regurg  Antimicrobials: None  Code Status: DNR   Subjective: Patient complains of feeling very weak, tired, short of breath  Objective: Noted episodes of hypotension and bradycardia Vitals:   08/04/21 1427 08/04/21 1636  BP: (!) 102/48 (!) 109/53  Pulse: (!) 56 (!) 57  Resp: 18 18  Temp: 97.6 F (36.4 C) 97.8 F (36.6 C)  SpO2: 98% 98%    Intake/Output Summary (Last 24 hours) at 08/04/2021 1856 Last data filed at 08/04/2021 1847 Gross per 24 hour  Intake 257 ml  Output 1500 ml  Net -1243 ml   Filed Weights   08/03/21 1244 08/04/21 1427  Weight: 117.9 kg 96.6 kg   Body mass index is 37.72 kg/m.  Exam:  General: Alert and oriented x3, mild distress HEENT: Normocephalic and atraumatic, mucous membranes are slightly dry Cardiovascular: Regular rhythm, borderline  bradycardia, occasional ectopic beats Respiratory: Decreased breath sounds throughout Abdomen: Soft, nontender, nondistended, hypoactive bowel sounds Musculoskeletal: No clubbing or cyanosis, trace pitting edema Skin: No skin breaks, tears or lesions Psychiatry: Appropriate, no evidence of psychoses Neurology: No focal deficits  Data Reviewed: Noted increase in creatinine, improvement in white blood cell count and still elevated CBGs  Disposition:  Status is: Inpatient Remains inpatient appropriate because: Respiratory and cardiac stabilization    Anticipated discharge date: 6/5  Remaining issues to be resolved so that patient can be discharged: Need short-term skilled nursing.  Stabilization cardiac standpoint.   Family Communication: Husband at the bedside DVT Prophylaxis:  apixaban Arne [place]) tablet 5 mg    Author: Annita Brod ,MD 08/04/2021 6:56 PM  To reach On-call, see care teams to locate the attending and reach out via [email or web address]. Between 7PM-7AM, please contact night-coverage If you still have difficulty reaching the attending provider, please page the [facility] (Director on Call) for [facility] on amion for assistance.

## 2021-08-04 NOTE — Assessment & Plan Note (Addendum)
Hemoglobin remained stable.

## 2021-08-05 DIAGNOSIS — I5033 Acute on chronic diastolic (congestive) heart failure: Secondary | ICD-10-CM | POA: Diagnosis not present

## 2021-08-05 DIAGNOSIS — N179 Acute kidney failure, unspecified: Secondary | ICD-10-CM | POA: Diagnosis not present

## 2021-08-05 DIAGNOSIS — J9611 Chronic respiratory failure with hypoxia: Secondary | ICD-10-CM | POA: Diagnosis not present

## 2021-08-05 LAB — COMPREHENSIVE METABOLIC PANEL
ALT: 11 U/L (ref 0–44)
AST: 9 U/L — ABNORMAL LOW (ref 15–41)
Albumin: 3 g/dL — ABNORMAL LOW (ref 3.5–5.0)
Alkaline Phosphatase: 63 U/L (ref 38–126)
Anion gap: 9 (ref 5–15)
BUN: 48 mg/dL — ABNORMAL HIGH (ref 8–23)
CO2: 34 mmol/L — ABNORMAL HIGH (ref 22–32)
Calcium: 9.1 mg/dL (ref 8.9–10.3)
Chloride: 93 mmol/L — ABNORMAL LOW (ref 98–111)
Creatinine, Ser: 1.01 mg/dL — ABNORMAL HIGH (ref 0.44–1.00)
GFR, Estimated: 57 mL/min — ABNORMAL LOW (ref >60)
Glucose, Bld: 228 mg/dL — ABNORMAL HIGH (ref 70–99)
Potassium: 3.3 mmol/L — ABNORMAL LOW (ref 3.5–5.1)
Sodium: 136 mmol/L (ref 135–145)
Total Bilirubin: 0.9 mg/dL (ref 0.3–1.2)
Total Protein: 6.8 g/dL (ref 6.5–8.1)

## 2021-08-05 LAB — GLUCOSE, CAPILLARY
Glucose-Capillary: 270 mg/dL — ABNORMAL HIGH (ref 70–99)
Glucose-Capillary: 302 mg/dL — ABNORMAL HIGH (ref 70–99)
Glucose-Capillary: 108 mg/dL — ABNORMAL HIGH (ref 70–99)
Glucose-Capillary: 252 mg/dL — ABNORMAL HIGH (ref 70–99)

## 2021-08-05 LAB — MAGNESIUM: Magnesium: 2 mg/dL (ref 1.7–2.4)

## 2021-08-05 MED ORDER — ACETAMINOPHEN 500 MG PO TABS
500.0000 mg | ORAL_TABLET | Freq: Four times a day (QID) | ORAL | Status: DC | PRN
  Administered 2021-08-05: 650 mg via ORAL
  Administered 2021-08-07 – 2021-08-09 (×2): 1000 mg via ORAL
  Filled 2021-08-05 (×2): qty 2

## 2021-08-05 MED ORDER — INSULIN GLARGINE-YFGN 100 UNIT/ML ~~LOC~~ SOLN
12.0000 [IU] | Freq: Every day | SUBCUTANEOUS | Status: DC
Start: 2021-08-05 — End: 2021-08-08
  Administered 2021-08-05 – 2021-08-08 (×4): 12 [IU] via SUBCUTANEOUS
  Filled 2021-08-05 (×4): qty 0.12

## 2021-08-05 MED ORDER — TRAMADOL HCL 50 MG PO TABS
50.0000 mg | ORAL_TABLET | Freq: Four times a day (QID) | ORAL | Status: DC | PRN
  Administered 2021-08-05: 50 mg via ORAL
  Filled 2021-08-05: qty 1

## 2021-08-05 MED ORDER — EMPAGLIFLOZIN 10 MG PO TABS
10.0000 mg | ORAL_TABLET | Freq: Every day | ORAL | Status: DC
  Administered 2021-08-05 – 2021-08-10 (×6): 10 mg via ORAL
  Filled 2021-08-05 (×6): qty 1

## 2021-08-05 MED ORDER — POTASSIUM CHLORIDE CRYS ER 20 MEQ PO TBCR
40.0000 meq | EXTENDED_RELEASE_TABLET | Freq: Once | ORAL | Status: AC
  Administered 2021-08-05: 40 meq via ORAL
  Filled 2021-08-05: qty 2

## 2021-08-05 NOTE — Progress Notes (Signed)
[facility]  Patient: Karen [name]    OXB:[id number]  DOA: 08/03/2021    Date of Service: the patient was seen and examined on 08/05/2021  Brief hospital course: 76 year old female with past medical history of diastolic CHF, morbid obesity, COPD respiratory failure on 3 L nasal cannula, obstructive sleep apnea not on CPAP, diabetes mellitus type 2 poorly controlled and paroxysmal atrial fibrillation presented to the emergency room with shortness of breath.  Patient called the emergency room physician that she had gained 40 pounds overnight and in review of her dry weight from last hospital discharge, this appeared to be correct.  Patient admitted to the hospitalist service and cardiology consulted.  Started on IV Lasix.    After some initial hypotension, patient has since improved, diuresing almost 4 L and is -3.2 L deficient.  Her blood sugars have also started to come down.  Assessment and Plan: Assessment and Plan: Acute on chronic diastolic CHF (congestive heart failure) (HCC) Initially received Lasix, but had to hold due to soft blood pressures.  After blood pressure improved with gentle IV fluids, fluids stopped and started on Lasix and since then she has diuresed almost 4 L and is -3.2 L deficient.  Renal function has since improved.  Cardiology following and started Jardiance 6/2.  Follow-up echocardiogram notes grade 3 diastolic dysfunction and preserved ejection fraction with moderate tricuspid regurg and moderate pulmonary hypertension.  If patient starts to decline again, cardiology considering right heart catheterization.  Severe pulmonary hypertension ([place]) As above.  Chronic respiratory failure with hypoxia (HCC) On baseline 3 L nasal cannula  Acute kidney injury superimposed on CKD ([place]) History of stage IIIa chronic kidney disease.  Creatinine on admission at 1.06 with GFR 54 but following Lasix, creatinine today at 1.46 with GFR of 37.  Continue to  follow.  OSA (obstructive sleep apnea) Contributing factor to her CHF decompensation.  Patient has been on CPAP here, but was supposed to get an outpatient sleep study and CPAP approval at home, but due to delays, not yet done.  Discussed with case management and we are trying to get patient approved for [facility] device.  If accepted, device should be delivered to patient's home before she has been discharged from skilled nursing rehab  Uncontrolled type 2 diabetes mellitus with hyperglycemia, with long-term current use of insulin ([place]) Have increased Lantus which should help with her auto diuresis.  CBGs poorly controlled  AF (paroxysmal atrial fibrillation) ([place]) Rate controlled, on Eliquis  Hyponatremia-resolved as of 08/05/2021 127 on admission.  In the setting of hypervolemia, resolved with diuresis  Leucocytosis Looks to be stress margination.  White blood cell count dropped from 20-14 without any antibiotic treatment.  No evidence of urinary tract infection.  Anemia in chronic kidney disease Hemoglobin stable at 9.4  Morbid obesity (HCC) Meets criteria with BMI greater than 35 and comorbidity of CHF       Body mass index is 37.72 kg/m.        Consultants: Cardiology  Procedures: Echocardiogram done 6/1: Grade 3 diastolic dysfunction.  Moderate pulmonary hypertension and tricuspid regurg  Antimicrobials: None  Code Status: DNR   Subjective: Patient states she is feeling a little bit better.  As of breath and a little more energy.  Objective: Noted episodes of hypotension and bradycardia Vitals:   08/05/21 1214 08/05/21 1648  BP: (!) 143/56 (!) 124/44  Pulse: 62 69  Resp: 20 (!) 22  Temp: 98.2 F (36.8 C) 97.7 F (36.5  C)  SpO2: 98% 95%    Intake/Output Summary (Last 24 hours) at 08/05/2021 1658 Last data filed at 08/05/2021 1300 Gross per 24 hour  Intake 737 ml  Output 1850 ml  Net -1113 ml    Filed Weights   08/03/21 1244 08/04/21 1427  Weight:  117.9 kg 96.6 kg   Body mass index is 37.72 kg/m.  Exam:  General: Alert and oriented x3, no acute distress HEENT: Normocephalic and atraumatic, mucous membranes are slightly dry Cardiovascular: Regular rate and rhythm, S1-S2, 2 out of 6 systolic ejection murmur Respiratory: Decreased breath sounds throughout Abdomen: Soft, nontender, nondistended, hypoactive bowel sounds Musculoskeletal: No clubbing or cyanosis, trace pitting edema Skin: No skin breaks, tears or lesions Psychiatry: Appropriate, no evidence of psychoses Neurology: No focal deficits  Data Reviewed: Noted improvement in renal function  Disposition:  Status is: Inpatient Remains inpatient appropriate because: Respiratory and cardiac stabilization    Anticipated discharge date: 6/5  Remaining issues to be resolved so that patient can be discharged: Need short-term skilled nursing.  Stabilization cardiac standpoint.   Family Communication: Left message for husband DVT Prophylaxis:  apixaban Arne [place]) tablet 5 mg    Author: Annita Brod ,MD 08/05/2021 4:58 PM  To reach On-call, see care teams to locate the attending and reach out via [email or web address]. Between 7PM-7AM, please contact night-coverage If you still have difficulty reaching the attending provider, please page the [facility] (Director on Call) for [facility] on amion for assistance.

## 2021-08-05 NOTE — Progress Notes (Addendum)
Cardiology Progress Note   Patient Name: Karen [name] Date of Encounter: 08/05/2021  Primary Cardiologist: Ida Rogue, MD  Subjective   Pt states she feels better than yesterday but breathing is still not back to baseline.   Inpatient Medications    Scheduled Meds:  allopurinol  100 mg Oral Daily   apixaban  5 mg Oral BID   atorvastatin  10 mg Oral Daily   ferrous sulfate  325 mg Oral Q breakfast   fluticasone furoate-vilanterol  1 puff Inhalation Daily   And   umeclidinium bromide  1 puff Inhalation Daily   furosemide  40 mg Intravenous BID   insulin aspart  0-20 Units Subcutaneous TID WC   insulin aspart  0-5 Units Subcutaneous QHS   insulin aspart  5 Units Subcutaneous TID WC   insulin glargine-yfgn  12 Units Subcutaneous Daily   insulin glargine-yfgn  22 Units Subcutaneous QHS   melatonin  5 mg Oral QHS   montelukast  10 mg Oral QHS   pantoprazole  80 mg Oral Q1200   vitamin B-12  500 mcg Oral Daily   Continuous Infusions:  PRN Meds: acetaminophen, albuterol, fluticasone, ipratropium, ipratropium-albuterol, ondansetron **OR** ondansetron (ZOFRAN) IV, senna-docusate, traZODone   Vital Signs    Vitals:   08/04/21 1636 08/04/21 1956 08/04/21 2333 08/05/21 0357  BP: (!) 109/53 (!) 107/45 118/81 104/61  Pulse: (!) 57 (!) 56 81 81  Resp: 18 18 16 16   Temp: 97.8 F (36.6 C) 97.7 F (36.5 C) 97.6 F (36.4 C) 97.9 F (36.6 C)  TempSrc: Oral  Oral   SpO2: 98% 94% 94% 94%  Weight:      Height:        Intake/Output Summary (Last 24 hours) at 08/05/2021 1005 Last data filed at 08/05/2021 0900 Gross per 24 hour  Intake 497 ml  Output 1400 ml  Net -903 ml   Filed Weights   08/03/21 1244 08/04/21 1427  Weight: 117.9 kg 96.6 kg    Physical Exam   GEN: Pleasant, in no acute distress.  HEENT: Grossly normal.  Neck: Supple, mild elev JVD, no carotid bruits, or masses. Cardiac: RRR, no rubs, or gallops. 2/6 systolic murmur @ RUSB. No clubbing or cyanosis.  No edema.  Radials 2+, DP/PT 2+ and equal bilaterally.  Respiratory:  Respirations regular and slightly labored, clear to auscultation bilat. GI: Obese, Soft, nontender, nondistended, BS + x 4. MS: no deformity or atrophy. Skin: warm and dry, no rash. Neuro:  Strength and sensation are intact. Psych: AAOx3.  Normal affect.  Labs    Chemistry Recent Labs  Lab 08/03/21 1324 08/04/21 0617 08/05/21 0535  NA 127* 130* 136  K 3.6 3.7 3.3*  CL 89* 87* 93*  CO2 30 34* 34*  GLUCOSE 189* 282* 228*  BUN 46* 51* 48*  CREATININE 1.06* 1.46* 1.01*  CALCIUM 9.1 8.9 9.1  PROT  --   --  6.8  ALBUMIN  --   --  3.0*  AST  --   --  9*  ALT  --   --  11  ALKPHOS  --   --  63  BILITOT  --   --  0.9  GFRNONAA 54* 37* 57*  ANIONGAP 8 9 9      Hematology Recent Labs  Lab 08/03/21 1324 08/04/21 0617  WBC 20.0* 14.1*  RBC 3.56* 3.31*  HGB 10.3* 9.4*  HCT 31.3* 29.8*  MCV 87.9 90.0  MCH 28.9 28.4  MCHC 32.9 31.5  RDW 15.0 15.1  PLT 273 250    Cardiac Enzymes  Recent Labs  Lab 08/03/21 1324 08/03/21 1549  TROPONINIHS 11 10      BNP    Component Value Date/Time   BNP 310.4 (H) 08/03/2021 1549   HbA1c  Lab Results  Component Value Date   HGBA1C 10.2 (H) 05/28/2021    Radiology    DG Chest 2 View  Result Date: 08/03/2021 CLINICAL DATA:  Provided history: Shortness of breath. Additional history provided: Patient reports significant weight gain overnight, chest pain, shortness of breath. EXAM: CHEST - 2 VIEW COMPARISON:  Prior chest radiographs 05/28/2021 and earlier. CT angiogram chest 02/05/2021. FINDINGS: Cardiomegaly, unchanged. Aortic atherosclerosis. Central pulmonary vascular congestion. Prominence of the interstitial lung markings in the perihilar regions and lung bases, which is suspicious for interstitial edema. No appreciable airspace consolidation. No evidence of pleural effusion or pneumothorax. No acute bony abnormality identified IMPRESSION: Cardiomegaly with  central pulmonary vascular congestion and suspected interstitial edema. Aortic Atherosclerosis (ICD10-I70.0). Electronically Signed   By: Kellie Simmering D.O.   On: 08/03/2021 13:05   Telemetry    Sinus rhythm  AFib from 80's to low 100's since 6/1 @ 20:08, converted to NSR 6/2 @ 10:32 - Personally Reviewed  Cardiac Studies   2D Echocardiogram 5.31.2023  1. Left ventricular ejection fraction, by estimation, is 55 to 60%. The  left ventricle has normal function. The left ventricle has no regional  wall motion abnormalities. Left ventricular diastolic parameters are  consistent with Grade III diastolic  dysfunction (restrictive). Elevated left atrial pressure. There is the  interventricular septum is flattened in systole, consistent with right  ventricular pressure overload.   2. Right ventricular systolic function is mildly reduced. The right  ventricular size is moderately enlarged. There is moderately elevated  pulmonary artery systolic pressure.   3. Left atrial size was mildly dilated.   4. The mitral valve is degenerative. Mild mitral valve regurgitation. No  evidence of mitral stenosis.   5. Tricuspid valve regurgitation is moderate.   6. The aortic valve has an indeterminant number of cusps. There is mild  calcification of the aortic valve. There is moderate thickening of the  aortic valve. Aortic valve regurgitation is not visualized. Mild to  moderate aortic valve stenosis. Aortic  valve area, by VTI measures 1.20 cm. Aortic valve mean gradient measures  10.3 mmHg.   7. The inferior vena cava is dilated in size with <50% respiratory  variability, suggesting right atrial pressure of 15 mmHg.   Patient Profile     76 y.o. female with a history of HFpEF, severe pulmonary hypertension, moderate aortic stenosis, junctional bradycardia with BB/CCB, PAF by 03/2019 monitor, chronic chest pain, GI bleed, Covid in 10/2020, DM2, HTN, HLD, chronic hypoxic respiratory failure on  supplemental oxygen, COPD, anemia, and obesity, who was admitted 5/31 w/ acute on chronic HFpEF in the setting of sudden 8 lbs wt gain.  Assessment & Plan    1.  Acute on chronic heart failure with preserved ejection fraction/pulmonary arterial hypertension: Patient with a history of HFpEF and valvular heart disease with an EF of 60 to 65%, mild aortic stenosis, mild to moderate MR, and moderate to severe TR by echo in December 2022.  She had been doing well at home following hospitalization in March of this year however, had sudden 8 pound weight gain on March 31 associated with increasing abdominal girth, mild lower extremity edema, dyspnea, and orthopnea, prompting call to paramedics and subsequently  EMS. Feels that breathing improved but not quite back to baseline.  Body habitus makes exam challenging, though renal fxn stable (BUN 48, Creatinine 1.01, Bicarb 34).  I/O inaccurate b/c she was in ED part of the day yesterday but listed as Negative 2 L since admission.  Cont to diurese today w/ close monitoring of renal function.  She did reasonably rate-controlled afib overnight, which spont converted back to sinus this AM.  No AVN blocking agents due to h/o profound jxnl bradycardia.  Consider low-dose spiro if pressure allows.  Will add jardiance.  2.  Essential hypertension: Stable.  3.  Hyperlipidemia: Remains on atorvastatin therapy.  4.  Paroxysmal atrial fibrillation: Patient was in afib 6/1 @ 20:08 until 6/2 @ 10:32 when she converted to NSR.  No AV nodal blocking agents in the setting of profound junctional bradycardia while on bisoprolol in the past.  Fortunately, she was asymptomatic and rates were reasonable - mostly in the 80's to 90's.  She is anticoagulated with Eliquis.    5.  Stage III chronic kidney disease: Followed closely with diuresis. 6/2 BUN 48, Creatinine 1.01.    6.  Chronic hypoxic respiratory failure: On home 3 L O2.  Management per internal medicine.  7.  Type 2 diabetes  mellitus: Per internal medicine.  8.  Hypokalemia:  supp.  Signed, Murray Hodgkins, NP  08/05/2021, 10:05 AM    For questions or updates, please contact   Please consult [email or web address] for contact info under Cardiology/STEMI.

## 2021-08-05 NOTE — Progress Notes (Signed)
Nutrition Brief Note  RD consulted for nutrition education per HF nurse.   Pt resting at time of visit. Awoke to name. No family present at bedside. Pt was not interested in nutrition education at this time.  Pt agreed for RD to leave "Low Sodium Nutrition Therapy" and "Plate Method" handout on counter for review.  Please re-consult if further nutrition needs arise.   Clayborne Dana, RDN, LDN Clinical Nutrition

## 2021-08-05 NOTE — Evaluation (Addendum)
Occupational Therapy Evaluation Patient Details Name: Karen [name] MRN: [id number] DOB: 02-04-1946 Today's Date: 08/05/2021   History of Present Illness Pt is a 76 yo female that presented to the ED due to SOB, dyspnea upon exertion, weight gain. Workup for potential acute on chronic CHF. PMH of CHF, COPD, sleep apnea, CKDII/III, HLD, DM, former tobacco use, PAF on eliquis, pulmonary HTN.   Clinical Impression   Pt. presents with weakness, limited activity tolerance, and limited functional mobility which limit her ability to complete basic ADL and IADL functioning. Pt. resides at home with her husband who assists her with LE ADLs, shower transfers, medication management, meal preparation, and transportation. Upon arrival. Pt. Just finished with PT. Ptt.'s SO2 is 98%, and HR 98 bpms on 3LO2. Pt. Requires MinA UE, and ModA LE ADLs. Pt. education was provided about PLB techniques, energy conservation, and work simplification techniques. A visual handout was provided to the pt.  Pt. Will benefit from OT services for ADL training, A/E training, and pt. Education about energy conservation, work simplification, PLB techniques, home modification, and DME. Pt. would benefit from SNF level of care upon discharge, with follow-up OT services.      Recommendations for follow up therapy are one component of a multi-disciplinary discharge planning process, led by the attending physician.  Recommendations may be updated based on patient status, additional functional criteria and insurance authorization.   Follow Up Recommendations  Skilled nursing-short term rehab (<3 hours/day)    Assistance Recommended at Discharge    Patient can return home with the following A lot of help with bathing/dressing/bathroom;A little help with walking and/or transfers;Assistance with cooking/housework;Direct supervision/assist for medications management    Functional Status Assessment  Patient has had a recent decline in  their functional status and demonstrates the ability to make significant improvements in function in a reasonable and predictable amount of time.  Equipment Recommendations  BSC/3in1    Recommendations for Other Services       Precautions / Restrictions Precautions Precautions: Fall Precaution Comments: watch spO2 Restrictions Weight Bearing Restrictions: No      Mobility Bed Mobility         Supine to sit: Min assist, HOB elevated Sit to supine: Min guard        Transfers   Equipment used: Rolling walker (2 wheels) Transfers: Sit to/from Stand Sit to Stand: Min assist    Mobility per PT report              Balance                                           ADL either performed or assessed with clinical judgement   ADL Overall ADL's : Needs assistance/impaired Eating/Feeding: Set up;Independent   Grooming: Set up;Min guard           Upper Body Dressing : Minimal assistance   Lower Body Dressing: Moderate assistance                       Vision  No change from baseline       Perception     Praxis      Pertinent Vitals/Pain Pain Assessment Pain Assessment: 0-10 Pain Score: 9  Pain Location: back pain Pain Intervention(s): Limited activity within patient's tolerance, Monitored during session, Patient requesting pain meds-RN notified, RN gave pain meds during session  Hand Dominance Right   Extremity/Trunk Assessment Upper Extremity Assessment Upper Extremity Assessment: Generalized weakness   Lower Extremity Assessment Lower Extremity Assessment: Generalized weakness (grossly 4-/5, 3+/5 for bilateral hip strength)       Communication Communication Communication: No difficulties   Cognition Arousal/Alertness: Awake/alert Behavior During Therapy: WFL for tasks assessed/performed Overall Cognitive Status: Within Functional Limits for tasks assessed                                        General Comments       Exercises     Shoulder Instructions      Home Living Family/patient expects to be discharged to:: Private residence Living Arrangements: Spouse/significant other Available Help at Discharge: Family;Available 24 hours/day Type of Home: Mobile home Home Access: Ramped entrance     Home Layout: One level     Bathroom Shower/Tub: Teacher, early years/pre: Handicapped height     Home Equipment: Grab bars - toilet;Grab bars - tub/shower;Rollator (4 wheels);Rolling Walker (2 wheels);BSC/3in1;Wheelchair - manual          Prior Functioning/Environment Prior Level of Function : Needs assist       Physical Assist : ADLs (physical)     Mobility Comments: Mod Ind amb with a rollator household distances only, w/c in the community, no fall history ADLs Comments: Pt.'s husband assists pt, with tub/shower transfers, LE dressining, meal preparation, and medication management        OT Problem List: Decreased strength;Decreased knowledge of use of DME or AE;Decreased activity tolerance;Decreased safety awareness      OT Treatment/Interventions: Self-care/ADL training;Patient/family education;DME and/or AE instruction    OT Goals(Current goals can be found in the care plan section) Acute Rehab OT Goals Patient Stated Goal: To return home OT Goal Formulation: With patient Potential to Achieve Goals: Good  OT Frequency: Min 2X/week    Co-evaluation              AM-PAC OT "6 Clicks" Daily Activity     Outcome Measure Help from another person eating meals?: None Help from another person taking care of personal grooming?: A Little Help from another person toileting, which includes using toliet, bedpan, or urinal?: A Little Help from another person bathing (including washing, rinsing, drying)?: A Lot Help from another person to put on and taking off regular upper body clothing?: A Little Help from another person to put on and taking off  regular lower body clothing?: A Lot 6 Click Score: 17   End of Session    Activity Tolerance: Patient tolerated treatment well Patient left: in bed;with call bell/phone within reach;with bed alarm set;with family/visitor present  OT Visit Diagnosis: Unsteadiness on feet (R26.81);Muscle weakness (generalized) (M62.81)                Time: 8921-1941 OT Time Calculation (min): 20 min Charges:  OT General Charges $OT Visit: 1 Visit OT Evaluation $OT Eval Low Complexity: 1 Low  Harrel Carina, MS, OTR/L   Harrel Carina 08/05/2021, 4:34 PM

## 2021-08-05 NOTE — Evaluation (Signed)
Physical Therapy Evaluation Patient Details Name: Karen [name] MRN: [id number] DOB: 03-04-1946 Today's Date: 08/05/2021  History of Present Illness  Pt is a 76 yo female that presented to the ED due to SOB, dyspnea upon exertion, weight gain. Workup for potential acute on chronic CHF. PMH of CHF, COPD, sleep apnea, CKDII/III, HLD, DM, former tobacco use, PAF on eliquis, pulmonary HTN.   Clinical Impression  Patient alert, oriented x4, reported 9/10 low back pain at start of session, RN notified and in room to administer pain medication. The patient stated at baseline she lives with her husband who is available 24/7 to assist, that he performs the IADLs, and that she is modI/I for ADLs, and ambulatory with her rollator.  The patient was able to move all limbs against gravity, 3+/5-4-/5 for BLE strength. She was able to perform supine to sit with minA, bed rails, and extended time, fair sitting balance noted. Sit <> stand with RW and minA for steadying, and she was able to ambulate 82ft, twice. No LOB noted, but effortful for patient and increased WOB noted. SpO2 and HR WFLs (on 3L via [place]). Pt declined any further mobility at this time, reported her breathing was the limiting factor. Returned to supine with CGA.  Overall the patient demonstrated deficits (see "PT Problem List") that impede the patient's functional abilities, safety, and mobility and would benefit from skilled PT intervention. Recommendation at this time is SNF due to decline in functional mobility, and activity tolerance/endurance pending further progress with mobility.         Recommendations for follow up therapy are one component of a multi-disciplinary discharge planning process, led by the attending physician.  Recommendations may be updated based on patient status, additional functional criteria and insurance authorization.  Follow Up Recommendations Skilled nursing-short term rehab (<3 hours/day)    Assistance Recommended  at Discharge    Patient can return home with the following  A lot of help with walking and/or transfers;A little help with bathing/dressing/bathroom;Assistance with cooking/housework;Assist for transportation;Help with stairs or ramp for entrance;Direct supervision/assist for medications management    Equipment Recommendations Other (comment) (TBD at next venue of care)  Recommendations for Other Services       Functional Status Assessment Patient has had a recent decline in their functional status and demonstrates the ability to make significant improvements in function in a reasonable and predictable amount of time.     Precautions / Restrictions Precautions Precautions: Fall Precaution Comments: watch spO2 Restrictions Weight Bearing Restrictions: No      Mobility  Bed Mobility Overal bed mobility: Needs Assistance Bed Mobility: Supine to Sit, Sit to Supine     Supine to sit: Min assist, HOB elevated Sit to supine: Min guard   General bed mobility comments: reliance on bed rails, extra time    Transfers Overall transfer level: Needs assistance Equipment used: Rolling walker (2 wheels) Transfers: Sit to/from Stand Sit to Stand: Min assist           General transfer comment: minA for steadying    Ambulation/Gait   Gait Distance (Feet):  (66ft, twice) Assistive device: Rolling walker (2 wheels)   Gait velocity: decreased     General Gait Details: pt able to ambulate forwards/backwards 46ft twice. declined further mobility at this time, increased RR and pt reported some SOB after activity. spO2 WFLs  Stairs            Wheelchair Mobility    Modified Rankin (Stroke Patients Only)  Balance Overall balance assessment: Needs assistance Sitting-balance support: Feet supported Sitting balance-Leahy Scale: Fair     Standing balance support: Reliant on assistive device for balance Standing balance-Leahy Scale: Fair                                Pertinent Vitals/Pain Pain Assessment Pain Assessment: 0-10 Pain Score: 9  Pain Location: back pain Pain Descriptors / Indicators: Aching, Sore Pain Intervention(s): Limited activity within patient's tolerance, Monitored during session, Repositioned, Patient requesting pain meds-RN notified, RN gave pain meds during session    Home Living Family/patient expects to be discharged to:: Private residence Living Arrangements: Spouse/significant other Available Help at Discharge: Family;Available 24 hours/day Type of Home: Mobile home Home Access: Ramped entrance       Home Layout: One level Home Equipment: Grab bars - toilet;Grab bars - tub/shower;Rollator (4 wheels);Rolling Walker (2 wheels);BSC/3in1;Wheelchair - manual      Prior Function Prior Level of Function : Needs assist             Mobility Comments: Mod Ind amb with a rollator household distances only, w/c in the community, no fall history ADLs Comments: Spouse assists with in/out of tub and donning socks/shoes, Ind with all other ADLs     Hand Dominance   Dominant Hand: Right    Extremity/Trunk Assessment   Upper Extremity Assessment Upper Extremity Assessment: Generalized weakness    Lower Extremity Assessment Lower Extremity Assessment: Generalized weakness (grossly 4-/5, 3+/5 for bilateral hip strength)       Communication   Communication: No difficulties  Cognition Arousal/Alertness: Awake/alert Behavior During Therapy: WFL for tasks assessed/performed Overall Cognitive Status: Within Functional Limits for tasks assessed                                          General Comments      Exercises     Assessment/Plan    PT Assessment Patient needs continued PT services  PT Problem List Decreased activity tolerance;Decreased mobility;Decreased balance;Cardiopulmonary status limiting activity       PT Treatment Interventions DME instruction;Therapeutic exercise;Gait  training;Balance training;Stair training;Neuromuscular re-education;Functional mobility training;Therapeutic activities;Patient/family education    PT Goals (Current goals can be found in the Care Plan section)  Acute Rehab PT Goals Patient Stated Goal: to breathe better PT Goal Formulation: With patient Time For Goal Achievement: 08/19/21 Potential to Achieve Goals: Good    Frequency Min 2X/week     Co-evaluation               AM-PAC PT "6 Clicks" Mobility  Outcome Measure Help needed turning from your back to your side while in a flat bed without using bedrails?: A Little Help needed moving from lying on your back to sitting on the side of a flat bed without using bedrails?: A Little Help needed moving to and from a bed to a chair (including a wheelchair)?: A Little Help needed standing up from a chair using your arms (e.g., wheelchair or bedside chair)?: A Little Help needed to walk in hospital room?: A Little Help needed climbing 3-5 steps with a railing? : Total 6 Click Score: 16    End of Session Equipment Utilized During Treatment: Gait belt Activity Tolerance: Patient limited by fatigue Patient left: in bed;with call bell/phone within reach;with bed alarm set Nurse Communication: Mobility  status PT Visit Diagnosis: Other abnormalities of gait and mobility (R26.89);Difficulty in walking, not elsewhere classified (R26.2)    Time: 4034-7425 PT Time Calculation (min) (ACUTE ONLY): 17 min   Charges:   PT Evaluation $PT Eval Low Complexity: 1 Low PT Treatments $Therapeutic Activity: 8-22 mins       Lieutenant Diego PT, DPT 3:26 PM,08/05/21

## 2021-08-05 NOTE — TOC Initial Note (Addendum)
Transition of Care [facility]) - Initial/Assessment Note    Patient Details  Name: Karen [name] MRN: [id number] Date of Birth: 1945-03-13  Transition of Care [facility]) CM/SW Contact:    Alberteen Sam, LCSW Phone Number: 08/05/2021, 10:46 AM  Clinical Narrative:                  Patient from home with husband, hx of going to [facility] back in March.   CSW spoke with patient's husband Ray who reports if it recommended she needs rehab they prefer [facility], Ricky at [facility] has been updated. Agreeable to accept patient back should she need rehab.   Hx of [facility] having CPAP for patient.   Ray expressed issues with patient going home from SNF and not having CPAP due to need for sleep study, expressed frustrations with attempting to set up sleep study once patient was home and now shes been months without it.   Agreeable to try for [facility], MD also on board. CSW spoke with Thedore Mins with [employer] (husband has his home CPAP through [facility]). Plan is for patient to get set up with trilogy/niv at home, should she dc to SNF [facility] will already be set up at home after short term rehab.   Patient's spouse in agreement with all the above.   Pending PT/OT evals for insurance purposes for SNF ([facility]).   NIV order forms signed by MD have been emailed to [employer] with [facility] to further process the order.   Expected Discharge Plan: Skilled Nursing Facility Barriers to Discharge: Continued Medical Work up   Patient Goals and CMS Choice Patient states their goals for this hospitalization and ongoing recovery are:: to go home CMS Medicare.gov Compare Post Acute Care list provided to:: Patient Represenative (must comment) (husband Ray) Choice offered to / list presented to : Spouse  Expected Discharge Plan and Services Expected Discharge Plan: [place]       Living arrangements for the past 2 months: Single Family Home                                      Prior Living  Arrangements/Services Living arrangements for the past 2 months: Single Family Home Lives with:: Spouse                   Activities of Daily Living Home Assistive Devices/Equipment: Environmental consultant (specify type), Oxygen (3L o2 at home) ADL Screening (condition at time of admission) Patient's cognitive ability adequate to safely complete daily activities?: Yes Is the patient deaf or have difficulty hearing?: No Does the patient have difficulty seeing, even when wearing glasses/contacts?: No Does the patient have difficulty concentrating, remembering, or making decisions?: No Patient able to express need for assistance with ADLs?: Yes Does the patient have difficulty dressing or bathing?: No Independently performs ADLs?: Yes (appropriate for developmental age) Does the patient have difficulty walking or climbing stairs?: No Weakness of Legs: None Weakness of Arms/Hands: None  Permission Sought/Granted                  Emotional Assessment       Orientation: : Oriented to Self, Oriented to Place, Oriented to  Time, Oriented to Situation Alcohol / Substance Use: Not Applicable Psych Involvement: No (comment)  Admission diagnosis:  Acute exacerbation of CHF (congestive heart failure) ([place]) [I50.9] Acute on chronic congestive heart failure, unspecified heart failure type ([place]) [  I50.9] Patient Active Problem List   Diagnosis Date Noted   Severe pulmonary hypertension ([place]) 05/29/2021   OSA (obstructive sleep apnea) 05/29/2021   Elevated troponin 05/28/2021   Lower abdominal pain 05/28/2021   Nausea and vomiting 05/28/2021   Prolonged QT interval 05/28/2021   At risk for fall due to comorbid condition 05/02/2021   Pleural effusion    Acute kidney injury superimposed on CKD ([place])    Impaired gait and mobility 12/20/2020   Pulmonary nodules/lesions, multiple 10/29/2020   Anemia in chronic kidney disease 10/29/2020   Aortic stenosis, moderate    AF (paroxysmal atrial  fibrillation) (HCC)    Gastroesophageal reflux disease without esophagitis    Chronic respiratory failure with hypercapnia ([place]) 05/21/2020   Severe sepsis ([place]) 05/21/2020   UTI (urinary tract infection) 05/21/2020   Hyperglycemia due to type 2 diabetes mellitus ([place]) 05/21/2020   Abnormal CT of liver 05/21/2020   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 05/21/2020   Morbid obesity ([place]) 05/08/2020   Athscl heart disease 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 03/06/2020   Acute hip pain, left 10/23/2019   Lumbar stenosis with neurogenic claudication 10/23/2019   COPD with acute exacerbation ([place]) 32/95/1884   Acute diastolic CHF (congestive heart failure) ([place]) 09/28/2019   (HFpEF) heart failure with preserved ejection fraction ([place]) 09/27/2019   Hypokalemia 06/28/2019   Hyponatremia 06/28/2019   Uncontrolled type 2 diabetes mellitus with hyperglycemia, with long-term current use of insulin ([place]) 06/28/2019   CKD (chronic kidney disease), stage IIIa 06/28/2019   Atrial fibrillation, chronic ([place]) 06/28/2019   Pulmonary edema 02/27/2019   Bradycardia 02/24/2019   Acute on chronic diastolic CHF (congestive heart failure) ([place]) 02/23/2019   Chronic respiratory failure with hypoxia ([place]) 02/23/2019   Atypical chest pain 02/13/2019   Osteopenia of neck of left femur 11/20/2018   AVM (arteriovenous malformation) of small bowel, acquired    Acute gastric ulcer with hemorrhage    Chronic diastolic heart failure ([place]) 08/22/2018   Diarrhea 08/22/2018   Junctional bradycardia    Acute on chronic heart failure with preserved ejection fraction (HFpEF) ([place])    AKI (acute kidney injury) ([place])    Symptomatic bradycardia 08/05/2018   Acute on chronic respiratory failure ([place]) 06/25/2018   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 diabetes mellitus ([place]) 01/25/2018   History of non anemic vitamin B12 deficiency 01/25/2018   Personal history of kidney stones 11/12/2017   Urge  incontinence 11/12/2017   History of leukocytosis 09/17/2017   GI bleed 08/26/2017   Arthritis 08/10/2017   Stage 4 chronic kidney disease ([place]) 08/10/2017   COPD (chronic obstructive pulmonary disease) ([place]) 08/10/2017   Diabetes mellitus type 2, uncomplicated ([place]) 16/60/6301   Hypertension 08/10/2017   Obesity (BMI 35.0-39.9 without comorbidity) 04/11/2017   Primary osteoarthritis of right knee 09/01/2016   Leucocytosis 10/19/2015   Asterixis 01/07/2015   Acute on chronic respiratory failure with hypoxia and hypercapnia ([place]) 01/07/2015   Sciatica 01/07/2015   Weakness 01/07/2015   Chronic 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 01/04/2015   Iron deficiency anemia 06/22/2014   Microalbuminuria 06/22/2014   CHF (congestive heart failure) ([place]) 04/20/2014   Edema, peripheral 04/20/2014   PCP:  Margarita Rana, MD Pharmacy:   [facility] [address], [place] [address] [place] [address] Phone: [phone] Fax: [phone]     Social Determinants of Health (SDOH) Interventions    Readmission Risk Interventions  10/30/2020    4:17 PM 06/21/2020    3:03 PM 09/30/2019   11:16 AM  Readmission Risk Prevention Plan  Transportation Screening Complete Complete Complete  PCP or Specialist Appt within 3-5 Days   Complete  Social Work Consult for [place] Planning/Counseling   Complete  Palliative Care Screening   Not Applicable  Medication Review Press photographer) Complete Complete Complete  PCP or Specialist appointment within 3-5 days of discharge Complete Complete   HRI or Home Care Consult Patient refused Complete   SW Recovery Care/Counseling Consult Complete Complete   Palliative Care Screening Not Applicable Not [place] Not Applicable Not Applicable

## 2021-08-05 NOTE — NC FL2 (Signed)
[place] LEVEL OF CARE SCREENING TOOL     IDENTIFICATION  Patient Name: Karen [name] Birthdate: 1946-01-09 Sex: female Admission Date (Current Location): 08/03/2021  [facility] and [place] Number:  Engineering geologist and Address:  [facility], [address], [place], [place] [address]      Provider Number: [id number]  Attending Physician Name and Address:  Annita Brod, MD  Relative Name and Phone Number:  Jeanell Sparrow (spouse) [phone]    Current Level of Care: Hospital Recommended Level of Care: [place] Prior Approval Number:    Date Approved/Denied:   PASRR Number: [id number]  Discharge Plan: SNF    Current Diagnoses: Patient Active Problem List   Diagnosis Date Noted   Severe pulmonary hypertension ([place]) 05/29/2021   OSA (obstructive sleep apnea) 05/29/2021   Elevated troponin 05/28/2021   Lower abdominal pain 05/28/2021   Nausea and vomiting 05/28/2021   Prolonged QT interval 05/28/2021   At risk for fall due to comorbid condition 05/02/2021   Pleural effusion    Acute kidney injury superimposed on CKD ([place])    Impaired gait and mobility 12/20/2020   Pulmonary nodules/lesions, multiple 10/29/2020   Anemia in chronic kidney disease 10/29/2020   Aortic stenosis, moderate    AF (paroxysmal atrial fibrillation) ([place])    Gastroesophageal reflux disease without esophagitis    Chronic respiratory failure with hypercapnia ([place]) 05/21/2020   Severe sepsis ([place]) 05/21/2020   UTI (urinary tract infection) 05/21/2020   Hyperglycemia due to type 2 diabetes mellitus ([place]) 05/21/2020   Abnormal CT of liver 05/21/2020   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 05/21/2020   Morbid obesity ([place]) 05/08/2020   Athscl heart disease 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 03/06/2020   Acute hip pain, left 10/23/2019   Lumbar stenosis with neurogenic claudication 10/23/2019   COPD with acute exacerbation ([place])  37/12/6267   Acute diastolic CHF (congestive heart failure) ([place]) 09/28/2019   (HFpEF) heart failure with preserved ejection fraction ([place]) 09/27/2019   Hypokalemia 06/28/2019   Hyponatremia 06/28/2019   Uncontrolled type 2 diabetes mellitus with hyperglycemia, with long-term current use of insulin ([place]) 06/28/2019   CKD (chronic kidney disease), stage IIIa 06/28/2019   Atrial fibrillation, chronic ([place]) 06/28/2019   Pulmonary edema 02/27/2019   Bradycardia 02/24/2019   Acute on chronic diastolic CHF (congestive heart failure) ([place]) 02/23/2019   Chronic respiratory failure with hypoxia ([place]) 02/23/2019   Atypical chest pain 02/13/2019   Osteopenia of neck of left femur 11/20/2018   AVM (arteriovenous malformation) of small bowel, acquired    Acute gastric ulcer with hemorrhage    Chronic diastolic heart failure ([place]) 08/22/2018   Diarrhea 08/22/2018   Junctional bradycardia    Acute on chronic heart failure with preserved ejection fraction (HFpEF) ([place])    AKI (acute kidney injury) ([place])    Symptomatic bradycardia 08/05/2018   Acute on chronic respiratory failure ([place]) 06/25/2018   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 diabetes mellitus ([place]) 01/25/2018   History of non anemic vitamin B12 deficiency 01/25/2018   Personal history of kidney stones 11/12/2017   Urge incontinence 11/12/2017   History of leukocytosis 09/17/2017   GI bleed 08/26/2017   Arthritis 08/10/2017   Stage 4 chronic kidney disease ([place]) 08/10/2017   COPD (chronic obstructive pulmonary disease) ([place]) 08/10/2017   Diabetes mellitus type 2, uncomplicated ([place]) 48/54/6270   Hypertension 08/10/2017   Obesity (BMI 35.0-39.9 without comorbidity) 04/11/2017   Primary osteoarthritis  of right knee 09/01/2016   Leucocytosis 10/19/2015   Asterixis 01/07/2015   Acute on chronic respiratory failure with hypoxia and hypercapnia (HCC) 01/07/2015   Sciatica 01/07/2015   Weakness 01/07/2015   Chronic midline  low back pain with bilateral sciatica 01/04/2015   Iron deficiency anemia 06/22/2014   Microalbuminuria 06/22/2014   CHF (congestive heart failure) ([place]) 04/20/2014   Edema, peripheral 04/20/2014    Orientation RESPIRATION BLADDER Height & Weight     Self, Time, Situation, Place  O2 (3L nasal cannula) Incontinent, External catheter Weight: 212 lb 15.4 oz (96.6 kg) Height:  5\' 3"  (160 cm)  BEHAVIORAL SYMPTOMS/MOOD NEUROLOGICAL BOWEL NUTRITION STATUS      Continent Diet (see discharge summary)  AMBULATORY STATUS COMMUNICATION OF NEEDS Skin   Limited Assist Verbally Normal                       Personal Care Assistance Level of Assistance  Bathing, Feeding, Total care, Dressing Bathing Assistance: Limited assistance Feeding assistance: Independent Dressing Assistance: Limited assistance Total Care Assistance: Limited assistance   Functional Limitations Info  Sight, Hearing, Speech Sight Info: Adequate Hearing Info: Adequate Speech Info: Adequate    SPECIAL CARE FACTORS FREQUENCY  PT (By licensed PT), OT (By licensed OT)     PT Frequency: min 4x weekly OT Frequency: min 4x weekly            Contractures Contractures Info: Not present    Additional Factors Info  Code Status, Allergies Code Status Info: DNR Allergies Info: Ace Inhibitors   Beta Adrenergic Blockers   Gabapentin   Lisinopril   Lyrica (Pregabalin)   Shrimp (Shellfish Allergy)           Current Medications (08/05/2021):  This is the current hospital active medication list Current Facility-Administered Medications  Medication Dose Route Frequency Provider Last Rate Last Admin   acetaminophen (TYLENOL) tablet 500-1,000 mg  500-1,000 mg Oral Q6H PRN Annita Brod, MD       albuterol (PROVENTIL) (2.5 MG/3ML) 0.083% nebulizer solution 3 mL  3 mL Nebulization Q6H PRN Imagene Sheller S, DO       allopurinol (ZYLOPRIM) tablet 100 mg  100 mg Oral Daily Imagene Sheller S, DO   100 mg at 08/05/21 4627    apixaban (ELIQUIS) tablet 5 mg  5 mg Oral BID Imagene Sheller S, DO   5 mg at 08/05/21 0350   atorvastatin (LIPITOR) tablet 10 mg  10 mg Oral Daily Imagene Sheller S, DO   10 mg at 08/05/21 0938   ferrous sulfate tablet 325 mg  325 mg Oral Q breakfast Imagene Sheller S, DO   325 mg at 08/05/21 1829   fluticasone (FLONASE) 50 MCG/ACT nasal spray 2 spray  2 spray Each Nare Daily PRN Imagene Sheller S, DO       fluticasone furoate-vilanterol (BREO ELLIPTA) 100-25 MCG/ACT 1 puff  1 puff Inhalation Daily Benita Gutter, RPH       And   umeclidinium bromide (INCRUSE ELLIPTA) 62.5 MCG/ACT 1 puff  1 puff Inhalation Daily Benita Gutter, RPH       furosemide (LASIX) injection 40 mg  40 mg Intravenous BID Imagene Sheller S, DO   40 mg at 08/05/21 0820   insulin aspart (novoLOG) injection 0-20 Units  0-20 Units Subcutaneous TID WC Imagene Sheller S, DO   4 Units at 08/04/21 1800   insulin aspart (novoLOG) injection 0-5 Units  0-5 Units Subcutaneous QHS Imagene Sheller  S, DO   3 Units at 08/04/21 2208   insulin aspart (novoLOG) injection 5 Units  5 Units Subcutaneous TID WC Imagene Sheller S, DO   5 Units at 08/04/21 1800   insulin glargine-yfgn (SEMGLEE) injection 12 Units  12 Units Subcutaneous Daily Annita Brod, MD       insulin glargine-yfgn [facility]) injection 22 Units  22 Units Subcutaneous QHS Imagene Sheller S, DO   22 Units at 08/04/21 2208   ipratropium (ATROVENT) 0.06 % nasal spray 2 spray  2 spray Each Nare QID PRN Imagene Sheller S, DO       ipratropium-albuterol (DUONEB) 0.5-2.5 (3) MG/3ML nebulizer solution 3 mL  3 mL Nebulization Q6H PRN Imagene Sheller S, DO       melatonin tablet 5 mg  5 mg Oral QHS Anwar, Bonnee Quin S, DO   5 mg at 08/04/21 2208   montelukast (SINGULAIR) tablet 10 mg  10 mg Oral QHS Imagene Sheller S, DO   10 mg at 08/04/21 2208   ondansetron (ZOFRAN) tablet 4 mg  4 mg Oral Q6H PRN Imagene Sheller S, DO       Or   ondansetron (ZOFRAN) injection 4 mg  4 mg Intravenous Q6H PRN Imagene Sheller S,  DO       pantoprazole (PROTONIX) EC tablet 80 mg  80 mg Oral Q1200 Imagene Sheller S, DO   80 mg at 08/05/21 2263   potassium chloride SA (KLOR-CON M) CR tablet 40 mEq  40 mEq Oral Once Annita Brod, MD       senna-docusate (Senokot-S) tablet 1 tablet  1 tablet Oral BID BM PRN Leslee Home, DO       traMADol (ULTRAM) tablet 50 mg  50 mg Oral Q6H PRN Annita Brod, MD       traZODone (DESYREL) tablet 50 mg  50 mg Oral QHS PRN Imagene Sheller S, DO       vitamin B-12 (CYANOCOBALAMIN) tablet 500 mcg  500 mcg Oral Daily Imagene Sheller S, DO   500 mcg at 08/05/21 3354     Discharge Medications: Please see discharge summary for a list of discharge medications.  Relevant Imaging Results:  Relevant Lab Results:   Additional Information [id number]  Alberteen Sam, LCSW

## 2021-08-05 NOTE — Progress Notes (Signed)
Karen [name] presents with chronic respiratory failure due to COPD.  The use of the NIV will treat patient's high PC02 levels (48).  Therefore, NIV use can reduce risk of exacerbations and future hospitalizations when used at night and during the day.  All alternate devices [phone] and [phone]) BiLevel/RAD have been considered and ruled out as patient requires continuous alarms, backup battery, and portability which are not possible with BiLevel/RAD devices.  An NIV with volume-targeted pressure support is necessary to prevent patient from life-threatening harm.  Interruption or failure to provide NIV would quickly lead to exacerbation of the patient's condition, hospital re-admission, and likely harm to the patient. Continued use is preferred.  Patient is able to protect their airways and clear secretions on their own.  [place], [place]

## 2021-08-06 DIAGNOSIS — I5033 Acute on chronic diastolic (congestive) heart failure: Secondary | ICD-10-CM | POA: Diagnosis not present

## 2021-08-06 DIAGNOSIS — G4733 Obstructive sleep apnea (adult) (pediatric): Secondary | ICD-10-CM | POA: Diagnosis not present

## 2021-08-06 DIAGNOSIS — I480 Paroxysmal atrial fibrillation: Secondary | ICD-10-CM | POA: Diagnosis not present

## 2021-08-06 DIAGNOSIS — J9611 Chronic respiratory failure with hypoxia: Secondary | ICD-10-CM | POA: Diagnosis not present

## 2021-08-06 LAB — BASIC METABOLIC PANEL
Anion gap: 10 (ref 5–15)
BUN: 38 mg/dL — ABNORMAL HIGH (ref 8–23)
CO2: 36 mmol/L — ABNORMAL HIGH (ref 22–32)
Calcium: 9.3 mg/dL (ref 8.9–10.3)
Chloride: 89 mmol/L — ABNORMAL LOW (ref 98–111)
Creatinine, Ser: 1.03 mg/dL — ABNORMAL HIGH (ref 0.44–1.00)
GFR, Estimated: 56 mL/min — ABNORMAL LOW (ref >60)
Glucose, Bld: 208 mg/dL — ABNORMAL HIGH (ref 70–99)
Potassium: 4 mmol/L (ref 3.5–5.1)
Sodium: 135 mmol/L (ref 135–145)

## 2021-08-06 LAB — GLUCOSE, CAPILLARY
Glucose-Capillary: 206 mg/dL — ABNORMAL HIGH (ref 70–99)
Glucose-Capillary: 221 mg/dL — ABNORMAL HIGH (ref 70–99)
Glucose-Capillary: 360 mg/dL — ABNORMAL HIGH (ref 70–99)
Glucose-Capillary: 155 mg/dL — ABNORMAL HIGH (ref 70–99)

## 2021-08-06 LAB — CBC
HCT: 31.8 % — ABNORMAL LOW (ref 36.0–46.0)
Hemoglobin: 10 g/dL — ABNORMAL LOW (ref 12.0–15.0)
MCH: 28.3 pg (ref 26.0–34.0)
MCHC: 31.4 g/dL (ref 30.0–36.0)
MCV: 90.1 fL (ref 80.0–100.0)
Platelets: 293 10*3/uL (ref 150–400)
RBC: 3.53 MIL/uL — ABNORMAL LOW (ref 3.87–5.11)
RDW: 14.9 % (ref 11.5–15.5)
WBC: 12.2 10*3/uL — ABNORMAL HIGH (ref 4.0–10.5)
nRBC: 0 % (ref 0.0–0.2)

## 2021-08-06 MED ORDER — INSULIN GLARGINE-YFGN 100 UNIT/ML ~~LOC~~ SOLN
25.0000 [IU] | Freq: Every day | SUBCUTANEOUS | Status: DC
  Administered 2021-08-06 – 2021-08-07 (×2): 25 [IU] via SUBCUTANEOUS
  Filled 2021-08-06 (×2): qty 0.25

## 2021-08-06 NOTE — Progress Notes (Addendum)
[facility] Progress Note  Patient: Karen [name]    WIO:[id number]  DOA: 08/03/2021    Date of Service: the patient was seen and examined on 08/06/2021  Brief hospital course: 76 year old female with past medical history of diastolic CHF, morbid obesity, COPD respiratory failure on 3 L nasal cannula, obstructive sleep apnea not on CPAP, diabetes mellitus type 2 poorly controlled and paroxysmal atrial fibrillation presented to the emergency room with shortness of breath.  Patient called the emergency room physician that she had gained 40 pounds overnight and in review of her dry weight from last hospital discharge, this appeared to be correct.  Patient admitted to the hospitalist service and cardiology consulted.  Started on IV Lasix.    After some initial hypotension, patient has since improved, diuresing over 7 L and he is -6.4 L deficient.  Her blood sugars have also started to come down.  Assessment and Plan: Assessment and Plan: Acute on chronic diastolic CHF (congestive heart failure) (HCC) Initially received Lasix, but had to hold due to soft blood pressures.  After blood pressure improved with gentle IV fluids, fluids stopped and started on Lasix and since then she has diuresed over 7 L and is -6.4 L deficient.  Renal function has improved since admission.  Cardiology following and started Jardiance 6/2.  Follow-up echocardiogram notes grade 3 diastolic dysfunction and preserved ejection fraction with moderate tricuspid regurg and moderate pulmonary hypertension.  If patient starts to decline again, cardiology considering right heart catheterization.  Severe pulmonary hypertension ([place]) As above.  Chronic respiratory failure with hypoxia (HCC) On baseline 3 L nasal cannula  Acute kidney injury superimposed on CKD ([place]) History of stage IIIa chronic kidney disease.  Creatinine on admission at 1.06 with GFR 54 but following Lasix, and peaked as high as 1.46, likely from  hypotension, but since then has improved, creatinine currently at 1.03  OSA (obstructive sleep apnea) Contributing factor to her CHF decompensation.  Patient has been on CPAP here, but was supposed to get an outpatient sleep study and CPAP approval at home, but due to delays, not yet done.  Discussed with case management and we are trying to get patient approved for trilogy device.  If accepted, device should be delivered to patient's home before she has been discharged from skilled nursing rehab  Uncontrolled type 2 diabetes mellitus with hyperglycemia, with long-term current use of insulin ([place]) Slowly trending up her Lantus.  She normally takes 45 units once a day.  She is currently on 12 in the morning and 20 2 in the evening.  Some hyperglycemia although not severe.  Increase evening dose to 25.  AF (paroxysmal atrial fibrillation) ([place]) Rate controlled, on Eliquis  Hyponatremia-resolved as of 08/05/2021 127 on admission.  In the setting of hypervolemia, resolved with diuresis  Leucocytosis Looks to be stress margination.  White blood cell count dropped from 20 down to 12.2 without any antibiotic treatment.  No evidence of urinary tract infection.  Anemia in chronic kidney disease Hemoglobin remained stable  Morbid obesity (HCC) Meets criteria with BMI greater than 35 and comorbidity of CHF       Body mass index is 36.48 kg/m.        Consultants: Cardiology  Procedures: Echocardiogram done 6/1: Grade 3 diastolic dysfunction.  Moderate pulmonary hypertension and tricuspid regurg  Antimicrobials: None  Code Status: DNR   Subjective: Patient states continues to feel better Objective:  Vitals:   08/06/21 0814 08/06/21 1153  BP: (!) 103/51 Marland Kitchen)  94/44  Pulse: 76 66  Resp: 18 19  Temp: 97.6 F (36.4 C) 97.6 F (36.4 C)  SpO2: 94% 93%    Intake/Output Summary (Last 24 hours) at 08/06/2021 1400 Last data filed at 08/06/2021 1100 Gross per 24 hour  Intake --  Output  3150 ml  Net -3150 ml    Filed Weights   08/03/21 1244 08/04/21 1427 08/06/21 0110  Weight: 117.9 kg 96.6 kg 93.4 kg   Body mass index is 36.48 kg/m.  Exam:  General: Alert and oriented x3, no acute distress HEENT: Normocephalic and atraumatic, mucous membranes are slightly dry Cardiovascular: Regular rate and rhythm, S1-S2, 2 out of 6 systolic ejection murmur Respiratory: Decreased breath sounds throughout Abdomen: Soft, nontender, nondistended, hypoactive bowel sounds Musculoskeletal: No clubbing or cyanosis, trace pitting edema Skin: No skin breaks, tears or lesions Psychiatry: Appropriate, no evidence of psychoses Neurology: No focal deficits  Data Reviewed: Stable renal function, decreasing white blood cell count  Disposition:  Status is: Inpatient Remains inpatient appropriate because: Respiratory and cardiac stabilization    Anticipated discharge date: 6/5 to skilled nursing  Remaining issues to be resolved so that patient can be discharged: Need short-term skilled nursing.  Stabilization cardiac standpoint.   Family Communication: Husband at the bedside DVT Prophylaxis:  apixaban Arne Cleveland) tablet 5 mg    Author: Annita Brod ,MD 08/06/2021 2:00 PM  To reach On-call, see care teams to locate the attending and reach out via [email or web address]. Between 7PM-7AM, please contact night-coverage If you still have difficulty reaching the attending provider, please page the [facility] (Director on Call) for [facility] on amion for assistance.

## 2021-08-06 NOTE — Progress Notes (Signed)
Cardiology Progress Note   Patient Name: Karen [name] Date of Encounter: 08/06/2021  Primary Cardiologist: Ida Rogue, MD  Subjective   Still does not feel that breathing is back to baseline.  No chest pain.  Lower extremity swelling resolved.  Sitting up on the edge of bed eating.  Husband at bedside.  Patient and family interested in going to rehab from here.  Inpatient Medications    Scheduled Meds:  allopurinol  100 mg Oral Daily   apixaban  5 mg Oral BID   atorvastatin  10 mg Oral Daily   empagliflozin  10 mg Oral Daily   ferrous sulfate  325 mg Oral Q breakfast   fluticasone furoate-vilanterol  1 puff Inhalation Daily   And   umeclidinium bromide  1 puff Inhalation Daily   furosemide  40 mg Intravenous BID   insulin aspart  0-20 Units Subcutaneous TID WC   insulin aspart  0-5 Units Subcutaneous QHS   insulin aspart  5 Units Subcutaneous TID WC   insulin glargine-yfgn  12 Units Subcutaneous Daily   insulin glargine-yfgn  22 Units Subcutaneous QHS   melatonin  5 mg Oral QHS   montelukast  10 mg Oral QHS   pantoprazole  80 mg Oral Q1200   vitamin B-12  500 mcg Oral Daily   Continuous Infusions:  PRN Meds: acetaminophen, albuterol, fluticasone, ipratropium, ipratropium-albuterol, ondansetron **OR** ondansetron (ZOFRAN) IV, senna-docusate, traMADol, traZODone   Vital Signs    Vitals:   08/06/21 0110 08/06/21 0431 08/06/21 0438 08/06/21 0814  BP: (!) 106/48 (!) 122/51  (!) 103/51  Pulse: 72 76  76  Resp: 18 20  18   Temp: 98 F (36.7 C) 97.7 F (36.5 C)  97.6 F (36.4 C)  TempSrc:      SpO2: 92% 95% 95% 94%  Weight: 93.4 kg     Height:        Intake/Output Summary (Last 24 hours) at 08/06/2021 1007 Last data filed at 08/06/2021 0950 Gross per 24 hour  Intake 240 ml  Output 2800 ml  Net -2560 ml   Filed Weights   08/03/21 1244 08/04/21 1427 08/06/21 0110  Weight: 117.9 kg 96.6 kg 93.4 kg    Physical Exam   GEN: Obese, in no acute distress.   HEENT: Grossly normal.  Neck: Supple, no JVD, carotid bruits, or masses. Cardiac: RRR, no rubs or gallops.  No clubbing, cyanosis, edema.  Radials 2+, DP/PT 2+ and equal bilaterally.  Respiratory:  Respirations regular and unlabored, clear to auscultation bilaterally. GI: Soft, nontender, nondistended, BS + x 4. MS: no deformity or atrophy. Skin: warm and dry, no rash. Neuro:  Strength and sensation are intact. Psych: AAOx3.  Normal affect.  Labs    Chemistry Recent Labs  Lab 08/04/21 0617 08/05/21 0535 08/06/21 0448  NA 130* 136 135  K 3.7 3.3* 4.0  CL 87* 93* 89*  CO2 34* 34* 36*  GLUCOSE 282* 228* 208*  BUN 51* 48* 38*  CREATININE 1.46* 1.01* 1.03*  CALCIUM 8.9 9.1 9.3  PROT  --  6.8  --   ALBUMIN  --  3.0*  --   AST  --  9*  --   ALT  --  11  --   ALKPHOS  --  63  --   BILITOT  --  0.9  --   GFRNONAA 37* 57* 56*  ANIONGAP 9 9 10      Hematology Recent Labs  Lab 08/03/21 1324 08/04/21 0617 08/06/21  0448  WBC 20.0* 14.1* 12.2*  RBC 3.56* 3.31* 3.53*  HGB 10.3* 9.4* 10.0*  HCT 31.3* 29.8* 31.8*  MCV 87.9 90.0 90.1  MCH 28.9 28.4 28.3  MCHC 32.9 31.5 31.4  RDW 15.0 15.1 14.9  PLT 273 250 293    Cardiac Enzymes  Recent Labs  Lab 08/03/21 1324 08/03/21 1549  TROPONINIHS 11 10      BNP    Component Value Date/Time   BNP 310.4 (H) 08/03/2021 1549    HbA1c  Lab Results  Component Value Date   HGBA1C 10.2 (H) 05/28/2021    Radiology    ----------  Telemetry    RSR, PACs - Personally Reviewed  Cardiac Studies   2D Echocardiogram 5.31.2023   1. Left ventricular ejection fraction, by estimation, is 55 to 60%. The  left ventricle has normal function. The left ventricle has no regional  wall motion abnormalities. Left ventricular diastolic parameters are  consistent with Grade III diastolic  dysfunction (restrictive). Elevated left atrial pressure. There is the  interventricular septum is flattened in systole, consistent with right   ventricular pressure overload.   2. Right ventricular systolic function is mildly reduced. The right  ventricular size is moderately enlarged. There is moderately elevated  pulmonary artery systolic pressure.   3. Left atrial size was mildly dilated.   4. The mitral valve is degenerative. Mild mitral valve regurgitation. No  evidence of mitral stenosis.   5. Tricuspid valve regurgitation is moderate.   6. The aortic valve has an indeterminant number of cusps. There is mild  calcification of the aortic valve. There is moderate thickening of the  aortic valve. Aortic valve regurgitation is not visualized. Mild to  moderate aortic valve stenosis. Aortic  valve area, by VTI measures 1.20 cm. Aortic valve mean gradient measures  10.3 mmHg.   7. The inferior vena cava is dilated in size with <50% respiratory  variability, suggesting right atrial pressure of 15 mmHg.   Patient Profile     76 y.o. female with a history of HFpEF, severe pulmonary hypertension, moderate aortic stenosis, junctional bradycardia with BB/CCB, PAF by 03/2019 monitor, chronic chest pain, GI bleed, Covid in 10/2020, DM2, HTN, HLD, chronic hypoxic respiratory failure on supplemental oxygen, COPD, anemia, and obesity, who was admitted 5/31 w/ acute on chronic HFpEF in the setting of sudden 8 lbs wt gain.  Assessment & Plan    1.  Acute on chronic heart failure with preserved ejection fraction/pulmonary arterial hypertension: History of HFpEF with multiple admissions over the past 6 months.  Presented May 31 with somewhat sudden 7 to 8 pound weight gain at home.  She has been responding well to IV diuresis, though clinical improvement has been slow.  Echo this admission with EF 55 to 57%, grade 3 diastolic dysfunction, moderately elevated PASP, mild MR, and mild to moderate aortic stenosis.  Minus 2 L yesterday and minus 5.3L since admission.  Wt down 3.2 kg.  She still feels not quite back to baseline despite her recorded  weight being lower than her usual home weight.  She does not appear to be particularly volume overloaded at this point though body habitus makes exam quite challenging.  Her renal function has been stable.  Discussed with medicine team.  We will plan ongoing intravenous diuresis today and can possibly transition back to oral diuretic therapy tomorrow.  In further discussing her presentation with her husband, it does sound that there was some dietary indiscretion prior to admission.  Heart rates and blood pressure have been stable without any additional atrial fibrillation.  Empagliflozin added June 2.  Blood pressures trend soft and will hold off on adding spironolactone for now, but will reconsider when she is transition to oral Lasix.  2.  Essential hypertension: Stable.  3.  Hyperlipidemia: Remains on atorvastatin therapy.  4.  Paroxysmal atrial fibrillation: Had rate controlled A-fib overnight June 1 into June 2.  No further A-fib.  She remains anticoagulated on Eliquis.  No AV nodal blocking agents in the setting of prior history of profound and symptomatic junctional bradycardia.  5.  Stage III chronic kidney disease: Stable.  Follow with diuresis.  6.  Chronic hypoxic respiratory failure: On home O2 at 3 L.  Husband points out today that she usually does well with CPAP while she is hospitalized but has been unable to get a sleep study in the outpatient setting.  Hospitalist service planning to send to rehab with Trelegy.  7.  Type 2 diabetes mellitus: Per internal medicine.  8.  Hypokalemia: Stable.  9.  Normocytic anemia: Stable.  Signed, Murray Hodgkins, NP  08/06/2021, 10:07 AM    For questions or updates, please contact   Please consult [email or web address] for contact info under Cardiology/STEMI.

## 2021-08-07 DIAGNOSIS — J9611 Chronic respiratory failure with hypoxia: Secondary | ICD-10-CM | POA: Diagnosis not present

## 2021-08-07 DIAGNOSIS — I5033 Acute on chronic diastolic (congestive) heart failure: Secondary | ICD-10-CM | POA: Diagnosis not present

## 2021-08-07 DIAGNOSIS — N179 Acute kidney failure, unspecified: Secondary | ICD-10-CM | POA: Diagnosis not present

## 2021-08-07 DIAGNOSIS — I480 Paroxysmal atrial fibrillation: Secondary | ICD-10-CM | POA: Diagnosis not present

## 2021-08-07 DIAGNOSIS — I272 Pulmonary hypertension, unspecified: Secondary | ICD-10-CM | POA: Diagnosis not present

## 2021-08-07 LAB — GLUCOSE, CAPILLARY
Glucose-Capillary: 220 mg/dL — ABNORMAL HIGH (ref 70–99)
Glucose-Capillary: 237 mg/dL — ABNORMAL HIGH (ref 70–99)
Glucose-Capillary: 259 mg/dL — ABNORMAL HIGH (ref 70–99)
Glucose-Capillary: 225 mg/dL — ABNORMAL HIGH (ref 70–99)
Glucose-Capillary: 227 mg/dL — ABNORMAL HIGH (ref 70–99)

## 2021-08-07 LAB — BASIC METABOLIC PANEL
Anion gap: 9 (ref 5–15)
BUN: 37 mg/dL — ABNORMAL HIGH (ref 8–23)
CO2: 38 mmol/L — ABNORMAL HIGH (ref 22–32)
Calcium: 8.8 mg/dL — ABNORMAL LOW (ref 8.9–10.3)
Chloride: 90 mmol/L — ABNORMAL LOW (ref 98–111)
Creatinine, Ser: 1.1 mg/dL — ABNORMAL HIGH (ref 0.44–1.00)
GFR, Estimated: 52 mL/min — ABNORMAL LOW (ref >60)
Glucose, Bld: 227 mg/dL — ABNORMAL HIGH (ref 70–99)
Potassium: 3.6 mmol/L (ref 3.5–5.1)
Sodium: 137 mmol/L (ref 135–145)

## 2021-08-07 MED ORDER — TORSEMIDE 20 MG PO TABS
40.0000 mg | ORAL_TABLET | Freq: Every day | ORAL | Status: DC
  Administered 2021-08-07 – 2021-08-10 (×4): 40 mg via ORAL
  Filled 2021-08-07 (×4): qty 2

## 2021-08-07 MED ORDER — DILTIAZEM HCL ER COATED BEADS 120 MG PO CP24
120.0000 mg | ORAL_CAPSULE | Freq: Every day | ORAL | Status: DC
  Administered 2021-08-07 – 2021-08-10 (×4): 120 mg via ORAL
  Filled 2021-08-07 (×4): qty 1

## 2021-08-07 MED ORDER — SPIRONOLACTONE 25 MG PO TABS
12.5000 mg | ORAL_TABLET | Freq: Every day | ORAL | Status: DC
  Administered 2021-08-07 – 2021-08-10 (×4): 12.5 mg via ORAL
  Filled 2021-08-07 (×2): qty 1
  Filled 2021-08-07 (×2): qty 0.5
  Filled 2021-08-07: qty 1
  Filled 2021-08-07: qty 0.5
  Filled 2021-08-07: qty 1
  Filled 2021-08-07: qty 0.5

## 2021-08-07 NOTE — TOC Progression Note (Signed)
Transition of Care [facility]) - Progression Note    Patient Details  Name: Karen [name] MRN: [id number] Date of Birth: 1946-03-04  Transition of Care [facility]) CM/SW Contact  Zigmund Daniel Dorian Pod, RN Phone Number:[phone] 08/07/2021, 12:54 PM  Clinical Narrative:    RN spoke with the pt and team concerning discharge planning needs related to CPAP recommendation. Dr. Manuella Ghazi requesting autotype CPAP for SNF usage. RN spoke with [facility]) with this request. [facility] will reach out to their vendor Monday and inquired if this system is available. TOC RN has provided the case manager managing this case for Monday with both [facility] [employer]) and treatment team of providers.   TOC will continue to be available to assist with any other needs.   Expected Discharge Plan: [place] Barriers to Discharge: Continued Medical Work up  Expected Discharge Plan and Services Expected Discharge Plan: [place] arrangements for the past 2 months: Single Family Home                                       Social Determinants of Health (SDOH) Interventions    Readmission Risk Interventions    10/30/2020    4:17 PM 06/21/2020    3:03 PM 09/30/2019   11:16 AM  Readmission Risk Prevention Plan  Transportation Screening Complete Complete Complete  PCP or Specialist Appt within 3-5 Days   Complete  Social Work Consult for [place] Planning/Counseling   Complete  Palliative Care Screening   Not Applicable  Medication Review Press photographer) Complete Complete Complete  PCP or Specialist appointment within 3-5 days of discharge Complete Complete   HRI or Home Care Consult Patient refused Complete   SW Recovery Care/Counseling Consult Complete Complete   Palliative Care Screening Not Applicable Not [place] Not Applicable Not Applicable

## 2021-08-07 NOTE — Progress Notes (Signed)
Progress Note   Patient: Karen [name] GYI:[id number] DOB: 1945-03-28 DOA: 08/03/2021     4 DOS: the patient was seen and examined on 08/07/2021   Brief hospital course: 76 year old female with past medical history of diastolic CHF, morbid obesity, COPD respiratory failure on 3 L nasal cannula, obstructive sleep apnea not on CPAP, diabetes mellitus type 2 poorly controlled and paroxysmal atrial fibrillation presented to the emergency room with shortness of breath.  Patient called the emergency room physician that she had gained 40 pounds overnight and in review of her dry weight from last hospital discharge, this appeared to be correct.  Patient admitted to the hospitalist service and cardiology consulted.  Started on IV Lasix.    After some initial hypotension, patient has since improved, diuresing over 7 L and he is -6.4 L deficient.  Her blood sugars have also started to come down.  6/4: Cardiology transitioned to Cardizem, Aldactone and torsemide.  Losartan to be stopped at discharge   Assessment and Plan: Acute on chronic diastolic CHF (congestive heart failure) (HCC) Initially received Lasix, but had to hold due to soft blood pressures.  After blood pressure improved with gentle IV fluids, fluids stopped and started on Lasix and since then she has diuresed over 8 L.  Renal function has improved since admission.  Cardiology following and started Jardiance 6/2.  Follow-up echocardiogram notes grade 3 diastolic dysfunction and preserved ejection fraction with moderate tricuspid regurg and moderate pulmonary hypertension.   Net IO Since Admission: -8,758 mL [08/07/21 1258]   Cardiology have transitioned her to oral torsemide.  Started oral Cardizem CD and Aldactone.  Stopping losartan at discharge.  Cardiology has signed off  Severe pulmonary hypertension ([place]) As above  Chronic respiratory failure with hypoxia (HCC) On baseline 3 L nasal cannula.  Acute kidney injury superimposed on  CKD ([place]) History of stage IIIa chronic kidney disease.  Creatinine on admission at 1.06 with GFR 54 but following Lasix, and peaked as high as 1.46, likely from hypotension, but since then has improved, creatinine currently at 1.1  Lab Results  Component Value Date   CREATININE 1.10 (H) 08/07/2021   CREATININE 1.03 (H) 08/06/2021   CREATININE 1.01 (H) 08/05/2021    OSA (obstructive sleep apnea) Contributing factor to her CHF decompensation.  Patient has been on CPAP here, but was supposed to get an outpatient sleep study and CPAP approval at home, but due to delays, not yet done.  Discussed with case management and we are trying to get patient approved for trilogy device.  If accepted, device should be delivered to patient's home before she has been discharged from skilled nursing rehab.  Uncontrolled type 2 diabetes mellitus with hyperglycemia, with long-term current use of insulin (HCC) Slowly trending up her Lantus.  She normally takes 45 units once a day.  She is currently on 12 in the morning and 25 in the evening.    AF (paroxysmal atrial fibrillation) (HCC) Rate controlled with Cardizem CD100 and 20 mg p.o. daily, on Eliquis for anticoagulation  Hyponatremia-resolved as of 08/05/2021 127 on admission.  In the setting of hypervolemia, resolved with diuresis  Leucocytosis Looks to be stress margination.  White blood cell count dropped from 20 down to 12.2 without any antibiotic treatment.  No evidence of infection  Anemia in chronic kidney disease Hemoglobin remained stable.  Morbid obesity ([place]) Meets criteria with BMI greater than 35 and comorbidity of CHF.        Subjective: Could not sleep well  last night so quite sleepy and yawning.  Reports her chronic back pain is an issue but she manages with Tylenol at home  Physical Exam: Vitals:   08/07/21 0431 08/07/21 0756 08/07/21 0901 08/07/21 1135  BP: (!) 111/50 117/70  126/70  Pulse: 85 99  72  Resp:  (!) 22 20 20    Temp: 98.7 F (37.1 C) 98 F (36.7 C)  97.9 F (36.6 C)  TempSrc: Oral Oral  Oral  SpO2: 91% 91%  98%  Weight: 92.1 kg     Height:       . General: Alert and oriented x3, no acute distress . HEENT: Normocephalic and atraumatic, mucous membranes are slightly dry . Cardiovascular: Regular rate and rhythm, S1-S2, 2 out of 6 systolic ejection murmur . Respiratory: Decreased breath sounds throughout . Abdomen: Soft, nontender, nondistended, hypoactive bowel sounds . Musculoskeletal: No clubbing or cyanosis, trace pitting edema . Skin: No skin breaks, tears or lesions . Psychiatry: Appropriate, no evidence of psychoses . Neurology: No focal deficits .  Data Reviewed:  Creatinine 1.1  Family Communication: None  Disposition: Status is: Inpatient Remains inpatient appropriate because: Waiting for placement   Planned Discharge Destination: Skilled nursing facility    DVT prophylaxis-Eliquis Time spent: 35 minutes  Author: Max Sane, MD 08/07/2021 12:59 PM  For on call review [email or web address].

## 2021-08-07 NOTE — Plan of Care (Signed)
  Problem: Education: Goal: Ability to verbalize understanding of medication therapies will improve Outcome: Progressing   Problem: Activity: Goal: Risk for activity intolerance will decrease Outcome: Progressing   Problem: Elimination: Goal: Will not experience complications related to urinary retention Outcome: Progressing

## 2021-08-07 NOTE — Progress Notes (Signed)
PT refused CPAP for HS, does not wear at home. Stable at this time.

## 2021-08-07 NOTE — Progress Notes (Signed)
Cardiology Progress Note  Patient ID: Karen [name] MRN: [id number] DOB: [birth year] Date of Encounter: 08/07/2021  Primary Cardiologist: Ida Rogue, MD  Subjective   Chief Complaint: SOB  HPI: Good diuretic response overnight.  Shortness of breath improving.  Close to baseline.  Brief atrial fibrillation this morning.  Back in sinus rhythm.  ROS:  All other ROS reviewed and negative. Pertinent positives noted in the HPI.     Inpatient Medications  Scheduled Meds:  allopurinol  100 mg Oral Daily   apixaban  5 mg Oral BID   atorvastatin  10 mg Oral Daily   empagliflozin  10 mg Oral Daily   ferrous sulfate  325 mg Oral Q breakfast   fluticasone furoate-vilanterol  1 puff Inhalation Daily   And   umeclidinium bromide  1 puff Inhalation Daily   insulin aspart  0-20 Units Subcutaneous TID WC   insulin aspart  0-5 Units Subcutaneous QHS   insulin aspart  5 Units Subcutaneous TID WC   insulin glargine-yfgn  12 Units Subcutaneous Daily   insulin glargine-yfgn  25 Units Subcutaneous QHS   melatonin  5 mg Oral QHS   montelukast  10 mg Oral QHS   pantoprazole  80 mg Oral Q1200   spironolactone  12.5 mg Oral Daily   torsemide  40 mg Oral Daily   vitamin B-12  500 mcg Oral Daily   Continuous Infusions:  PRN Meds: acetaminophen, albuterol, fluticasone, ipratropium, ipratropium-albuterol, ondansetron **OR** ondansetron (ZOFRAN) IV, senna-docusate, traMADol, traZODone   Vital Signs   Vitals:   08/06/21 1839 08/07/21 0431 08/07/21 0756 08/07/21 0901  BP: (!) 113/47 (!) 111/50 117/70   Pulse: 100 85 99   Resp: 20  (!) 22 20  Temp: 97.9 F (36.6 C) 98.7 F (37.1 C) 98 F (36.7 C)   TempSrc: Oral Oral Oral   SpO2: 96% 91% 91%   Weight:  92.1 kg    Height:        Intake/Output Summary (Last 24 hours) at 08/07/2021 1052 Last data filed at 08/07/2021 0900 Gross per 24 hour  Intake 240 ml  Output 3875 ml  Net -3635 ml      08/07/2021    4:31 AM 08/06/2021    1:10 AM  08/04/2021    2:27 PM  Last 3 Weights  Weight (lbs) 203 lb 0.7 oz 205 lb 14.6 oz 212 lb 15.4 oz  Weight (kg) 92.1 kg 93.4 kg 96.6 kg      Telemetry  Overnight telemetry shows brief A-fib this morning.  Back in sinus rhythm in the 70s., which I personally reviewed.   Physical Exam   Vitals:   08/06/21 1839 08/07/21 0431 08/07/21 0756 08/07/21 0901  BP: (!) 113/47 (!) 111/50 117/70   Pulse: 100 85 99   Resp: 20  (!) 22 20  Temp: 97.9 F (36.6 C) 98.7 F (37.1 C) 98 F (36.7 C)   TempSrc: Oral Oral Oral   SpO2: 96% 91% 91%   Weight:  92.1 kg    Height:        Intake/Output Summary (Last 24 hours) at 08/07/2021 1052 Last data filed at 08/07/2021 0900 Gross per 24 hour  Intake 240 ml  Output 3875 ml  Net -3635 ml       08/07/2021    4:31 AM 08/06/2021    1:10 AM 08/04/2021    2:27 PM  Last 3 Weights  Weight (lbs) 203 lb 0.7 oz 205 lb 14.6 oz 212 lb 15.4  oz  Weight (kg) 92.1 kg 93.4 kg 96.6 kg    Body mass index is 35.97 kg/m.  General: Well nourished, well developed, in no acute distress Head: Atraumatic, normal size  Eyes: PEERLA, EOMI  Neck: Supple, JVD 5-7 cmH2O Endocrine: No thryomegaly Cardiac: Normal S1, S2; RRR; no murmurs, rubs, or gallops Lungs: Diminished breath sounds bilaterally Abd: Soft, nontender, no hepatomegaly  Ext: No edema, pulses 2+ Musculoskeletal: No deformities, BUE and BLE strength normal and equal Skin: Warm and dry, no rashes   Neuro: Alert and oriented to person, place, time, and situation, CNII-XII grossly intact, no focal deficits  Psych: Normal mood and affect   Labs  High Sensitivity Troponin:   Recent Labs  Lab 08/03/21 1324 08/03/21 1549  TROPONINIHS 11 10     Cardiac EnzymesNo results for input(s): TROPONINI in the last 168 hours. No results for input(s): TROPIPOC in the last 168 hours.  Chemistry Recent Labs  Lab 08/05/21 0535 08/06/21 0448 08/07/21 0421  NA 136 135 137  K 3.3* 4.0 3.6  CL 93* 89* 90*  CO2 34* 36* 38*   GLUCOSE 228* 208* 227*  BUN 48* 38* 37*  CREATININE 1.01* 1.03* 1.10*  CALCIUM 9.1 9.3 8.8*  PROT 6.8  --   --   ALBUMIN 3.0*  --   --   AST 9*  --   --   ALT 11  --   --   ALKPHOS 63  --   --   BILITOT 0.9  --   --   GFRNONAA 57* 56* 52*  ANIONGAP 9 10 9     Hematology Recent Labs  Lab 08/03/21 1324 08/04/21 0617 08/06/21 0448  WBC 20.0* 14.1* 12.2*  RBC 3.56* 3.31* 3.53*  HGB 10.3* 9.4* 10.0*  HCT 31.3* 29.8* 31.8*  MCV 87.9 90.0 90.1  MCH 28.9 28.4 28.3  MCHC 32.9 31.5 31.4  RDW 15.0 15.1 14.9  PLT 273 250 293   BNP Recent Labs  Lab 08/03/21 1549  BNP 310.4*    DDimer No results for input(s): DDIMER in the last 168 hours.   Radiology  No results found.  Cardiac Studies  TTE 08/04/2021  1. Left ventricular ejection fraction, by estimation, is 55 to 60%. The  left ventricle has normal function. The left ventricle has no regional  wall motion abnormalities. Left ventricular diastolic parameters are  consistent with Grade III diastolic  dysfunction (restrictive). Elevated left atrial pressure. There is the  interventricular septum is flattened in systole, consistent with right  ventricular pressure overload.   2. Right ventricular systolic function is mildly reduced. The right  ventricular size is moderately enlarged. There is moderately elevated  pulmonary artery systolic pressure.   3. Left atrial size was mildly dilated.   4. The mitral valve is degenerative. Mild mitral valve regurgitation. No  evidence of mitral stenosis.   5. Tricuspid valve regurgitation is moderate.   6. The aortic valve has an indeterminant number of cusps. There is mild  calcification of the aortic valve. There is moderate thickening of the  aortic valve. Aortic valve regurgitation is not visualized. Mild to  moderate aortic valve stenosis. Aortic  valve area, by VTI measures 1.20 cm. Aortic valve mean gradient measures  10.3 mmHg.   7. The inferior vena cava is dilated in size  with <50% respiratory  variability, suggesting right atrial pressure of 15 mmHg.   Patient Profile  76 year old female with history of HFpEF, severe pulm hypertension, COPD/OSA, diabetes, hypertension,  paroxysmal atrial fibrillation who was admitted on 08/03/2021 for acute on chronic diastolic heart failure.  Assessment & Plan   #Acute on chronic diastolic heart failure #Severe pulmonary hypertension #Cor pulmonale #COPD/OSA -Net -3.8 L overnight.  Net -8.3 L since admission.  Effectively euvolemic in my opinion. -Transition to 40 mg of p.o. torsemide today.  I have added Aldactone 12.5 mg daily to help with potassium. -On Jardiance 10 mg daily. -Her big issue is poor compliance with treatment of her lung disease.  This ultimately resulted in worsening right heart failure.  Would work with hospital medicine to ensure appropriate and safe discharge regarding her pulmonary management. -Would discontinue losartan at discharge.  Would go out on torsemide and Aldactone.  Also added colchicine.  Discussion below.  #Paroxysmal atrial fibrillation -Brief A-fib this morning.  Back in sinus rhythm. -Continue Eliquis. -I have added Cardizem extended release 120 mg daily.  [facility] will sign off.   Medication Recommendations: As above. Other recommendations (labs, testing, etc): None. Follow up as an outpatient: We will arrange outpatient follow-up in 2 to 4 weeks.  For questions or updates, please contact [place] Please consult [email or web address] for contact info under   Signed, [name] T. Audie Box, MD, [place]  08/07/2021 10:52 AM

## 2021-08-08 DIAGNOSIS — I509 Heart failure, unspecified: Secondary | ICD-10-CM

## 2021-08-08 LAB — HEMOGLOBIN A1C
Hgb A1c MFr Bld: 9.1 % — ABNORMAL HIGH (ref 4.8–5.6)
Mean Plasma Glucose: 214.47 mg/dL

## 2021-08-08 LAB — BASIC METABOLIC PANEL
Anion gap: 12 (ref 5–15)
BUN: 44 mg/dL — ABNORMAL HIGH (ref 8–23)
CO2: 37 mmol/L — ABNORMAL HIGH (ref 22–32)
Calcium: 9.1 mg/dL (ref 8.9–10.3)
Chloride: 90 mmol/L — ABNORMAL LOW (ref 98–111)
Creatinine, Ser: 1.29 mg/dL — ABNORMAL HIGH (ref 0.44–1.00)
GFR, Estimated: 43 mL/min — ABNORMAL LOW (ref >60)
Glucose, Bld: 238 mg/dL — ABNORMAL HIGH (ref 70–99)
Potassium: 4.3 mmol/L (ref 3.5–5.1)
Sodium: 139 mmol/L (ref 135–145)

## 2021-08-08 LAB — GLUCOSE, CAPILLARY
Glucose-Capillary: 266 mg/dL — ABNORMAL HIGH (ref 70–99)
Glucose-Capillary: 278 mg/dL — ABNORMAL HIGH (ref 70–99)
Glucose-Capillary: 242 mg/dL — ABNORMAL HIGH (ref 70–99)
Glucose-Capillary: 297 mg/dL — ABNORMAL HIGH (ref 70–99)

## 2021-08-08 MED ORDER — INSULIN GLARGINE-YFGN 100 UNIT/ML ~~LOC~~ SOLN: 18.0000 [IU] | Freq: Two times a day (BID) | SUBCUTANEOUS | 11 refills | Status: DC

## 2021-08-08 MED ORDER — INSULIN GLARGINE-YFGN 100 UNIT/ML ~~LOC~~ SOLN
18.0000 [IU] | Freq: Two times a day (BID) | SUBCUTANEOUS | Status: DC
  Administered 2021-08-08 – 2021-08-10 (×4): 18 [IU] via SUBCUTANEOUS
  Filled 2021-08-08 (×5): qty 0.18

## 2021-08-08 MED ORDER — TORSEMIDE 40 MG PO TABS: 40.0000 mg | ORAL_TABLET | Freq: Every day | ORAL | 1 refills | Status: DC

## 2021-08-08 MED ORDER — INSULIN LISPRO (1 UNIT DIAL) 100 UNIT/ML (KWIKPEN): 5.0000 [IU] | PEN_INJECTOR | Freq: Three times a day (TID) | SUBCUTANEOUS | 11 refills | Status: DC

## 2021-08-08 MED ORDER — SPIRONOLACTONE 25 MG PO TABS: 12.5000 mg | ORAL_TABLET | Freq: Every day | ORAL | 1 refills | Status: DC

## 2021-08-08 MED ORDER — EMPAGLIFLOZIN 10 MG PO TABS: 10.0000 mg | ORAL_TABLET | Freq: Every day | ORAL | 2 refills | Status: DC

## 2021-08-08 MED ORDER — DILTIAZEM HCL ER 60 MG PO CP12: 60.0000 mg | ORAL_CAPSULE | Freq: Two times a day (BID) | ORAL | 1 refills | Status: DC

## 2021-08-08 NOTE — Assessment & Plan Note (Signed)
Looks to be stress margination.  White blood cell count dropped from 20 down to 12.2 without any antibiotic treatment.  No evidence of infection

## 2021-08-08 NOTE — Discharge Summary (Signed)
Physician Discharge Summary   Patient: Karen [name] MRN: [id number] DOB: 1945/11/08  Admit date:     08/03/2021  Discharge date: 08/10/21  Discharge Physician: Annita Brod   PCP: Margarita Rana, MD   Recommendations at discharge:   Medication change: Losartan discontinued Medication change: Lantus decreased from 45 units once a day to 18 units twice a day Medication change: NovoLog decreased from 15 units 3 times daily with meals to 5 units 3 times daily with meals New medication: Demadex 40 mg p.o. daily New medication: Aldactone 12.5 mg p.o. daily New medication: Jardiance 10 mg daily New medication: Cardizem 60 mg SR twice daily Patient will follow-up with cardiology in the next 1 month Patient going to skilled nursing  Discharge Diagnoses: Active Problems:   Acute on chronic diastolic CHF (congestive heart failure) (HCC)   Severe pulmonary hypertension (HCC)   Chronic respiratory failure with hypoxia (HCC)   Acute kidney injury superimposed on CKD ([place])   Uncontrolled type 2 diabetes mellitus with hyperglycemia, with long-term current use of insulin (HCC)   OSA (obstructive sleep apnea)   AF (paroxysmal atrial fibrillation) (HCC)   Leucocytosis   Anemia in chronic kidney disease   Morbid obesity ([place])  Resolved Problems:   Hyponatremia  Hospital Course: 76 year old female with past medical history of diastolic CHF, morbid obesity, COPD respiratory failure on 3 L nasal cannula, obstructive sleep apnea not on CPAP, diabetes mellitus type 2 poorly controlled and paroxysmal atrial fibrillation presented to the emergency room with shortness of breath.  Patient called the emergency room physician that she had gained 40 pounds overnight and in review of her dry weight from last hospital discharge, this appeared to be correct.  Patient admitted to the hospitalist service and cardiology consulted.  Started on IV Lasix.    After some initial hypotension, patient has since  improved, diuresing over 12 L and is almost 11 L deficient.  Cardiology has transitioned her to Cardizem Aldactone and torsemide and discontinued her losartan.  Patient doing much better and seen by physical therapy who recommended skilled nursing.  Assessment and Plan: Acute on chronic diastolic CHF (congestive heart failure) (HCC) Initially received Lasix, but had to hold due to soft blood pressures.  After blood pressure improved with gentle IV fluids, fluids stopped and started on Lasix and since then she has diuresed over 8 L.  Renal function has improved since admission.  Cardiology following and started Jardiance 6/2.  Follow-up echocardiogram notes grade 3 diastolic dysfunction and preserved ejection fraction with moderate tricuspid regurg and moderate pulmonary hypertension.   Net IO Since Admission: -10,943 mL [08/08/21 1417]   Cardiology have transitioned her to oral torsemide.  Started oral Cardizem CD and Aldactone.  Stopping losartan at discharge.   Severe pulmonary hypertension (HCC) As above  Chronic respiratory failure with hypoxia (HCC) On baseline 3 L nasal cannula.  Acute kidney injury superimposed on CKD ([place]) History of stage IIIa chronic kidney disease.  Creatinine on admission at 1.06 with GFR 54 but following Lasix, and peaked as high as 1.46, likely from hypotension, but since then has improved.  Creatinine on day of discharge slightly up at 1.29 likely from overdiuresis, but now Lasix has been stopped and patient put on p.o. diuretics  Lab Results  Component Value Date   CREATININE 1.29 (H) 08/08/2021   CREATININE 1.10 (H) 08/07/2021   CREATININE 1.03 (H) 08/06/2021    OSA (obstructive sleep apnea) Contributing factor to her CHF decompensation.  Patient  has been on CPAP here, but was supposed to get an outpatient sleep study and CPAP approval at home, but due to delays, not yet done.  Discussed with case management and we are trying to get patient approved for  trilogy device.  If accepted, device should be delivered to patient's home before she has been discharged from skilled nursing rehab.  Uncontrolled type 2 diabetes mellitus with hyperglycemia, with long-term current use of insulin (HCC) Slowly trending up her Lantus.  She normally takes 45 units once a day.  Lantus has been slowly titrated upward since hospitalization.  Some of her hyperglycemia may have been due to uncontrolled pulmonary hypertension and volume overload.  Blood sugars have been better so have increased her to 18 units twice a day by day of discharge and changed her 3 times daily NovoLog to 5 units.  Both of these can be titrated upwards if further hyperglycemia is present  AF (paroxysmal atrial fibrillation) ([place]) Rate controlled with Cardizem.  Initially patient placed on Cardizem 120 by cardiology but noted to have some mild bradycardia by day of discharge so changed to 60 mg p.o. twice daily  Hyponatremia-resolved as of 08/05/2021 127 on admission.  In the setting of hypervolemia, resolved with diuresis  Leucocytosis Looks to be stress margination.  White blood cell count dropped from 20 down to 12.2 without any antibiotic treatment.  No evidence of infection  Anemia in chronic kidney disease Hemoglobin remained stable.  Morbid obesity ([place]) Meets criteria with BMI greater than 35 and comorbidity of CHF.         Consultants: Cardiology Procedures performed: None Disposition: Skilled nursing facility Diet recommendation:  Discharge Diet Orders (From admission, onward)     Start     Ordered   08/08/21 0000  Diet - low sodium heart healthy        08/08/21 1416           Cardiac and Carb modified diet DISCHARGE MEDICATION: Allergies as of 08/08/2021       Reactions   Ace Inhibitors Hives   Beta Adrenergic Blockers    Junctional bradycardia   Gabapentin Hives   Lisinopril Hives   Lyrica [pregabalin] Hives   Shrimp [shellfish Allergy] Swelling    Swelling of the lips        Medication List     STOP taking these medications    furosemide 20 MG tablet Commonly known as: Lasix   Lantus SoloStar 100 UNIT/ML Solostar Pen Generic drug: insulin glargine   losartan 50 MG tablet Commonly known as: COZAAR   metFORMIN 500 MG tablet Commonly known as: GLUCOPHAGE       TAKE these medications    acetaminophen 500 MG tablet Commonly known as: TYLENOL Take 1-2 tablets (500-1,000 mg total) by mouth every 6 (six) hours as needed for mild pain, fever, moderate pain or headache. Do not take more than 4 grams a day   albuterol 108 (90 Base) MCG/ACT inhaler Commonly known as: VENTOLIN HFA Inhale 2 puffs into the lungs every 6 (six) hours as needed for wheezing or shortness of breath.   allopurinol 100 MG tablet Commonly known as: ZYLOPRIM Take 1 tablet by mouth daily.   atorvastatin 10 MG tablet Commonly known as: LIPITOR Take 1 tablet by mouth daily.   diltiazem 60 MG 12 hr capsule Commonly known as: CARDIZEM SR Take 1 capsule (60 mg total) by mouth 2 (two) times daily.   Dulaglutide 3 MG/0.5ML Sopn Inject 0.5 mLs into the  skin once a week.   Eliquis 5 MG Tabs tablet Generic drug: apixaban Take 1 tablet by mouth twice daily   empagliflozin 10 MG Tabs tablet Commonly known as: JARDIANCE Take 1 tablet (10 mg total) by mouth daily. Start taking on: August 09, 2021   esomeprazole 40 MG capsule Commonly known as: NEXIUM Take 40 mg by mouth daily.   fluticasone 50 MCG/ACT nasal spray Commonly known as: FLONASE Place 2 sprays into both nostrils daily.   Fluticasone-Umeclidin-Vilant 100-62.5-25 MCG/INH Aepb Inhale 1 puff into the lungs daily.   insulin glargine-yfgn 100 UNIT/ML injection Commonly known as: SEMGLEE Inject 0.18 mLs (18 Units total) into the skin 2 (two) times daily.   insulin lispro 100 UNIT/ML KwikPen Commonly known as: HUMALOG Inject 5 Units into the skin in the morning, at noon, and at bedtime. 10  units with meals plus additional units for correction What changed: how much to take   ipratropium 0.06 % nasal spray Commonly known as: ATROVENT Place 2 sprays into both nostrils 4 (four) times daily.   ipratropium-albuterol 0.5-2.5 (3) MG/3ML Soln Commonly known as: DUONEB Take 3 mLs by nebulization 4 (four) times daily as needed.   Iron 325 (65 Fe) MG Tabs Take 1 tablet (325 mg total) by mouth daily.   melatonin 5 MG Tabs Take 5 mg by mouth at bedtime.   montelukast 10 MG tablet Commonly known as: SINGULAIR Take 10 mg by mouth at bedtime.   senna-docusate 8.6-50 MG tablet Commonly known as: Senokot-S Take 1 tablet by mouth 2 (two) times daily between meals as needed for mild constipation.   spironolactone 25 MG tablet Commonly known as: ALDACTONE Take 0.5 tablets (12.5 mg total) by mouth daily. Start taking on: August 09, 2021   Torsemide 40 MG Tabs Take 40 mg by mouth daily. Start taking on: August 09, 2021   traZODone 50 MG tablet Commonly known as: DESYREL Take 1 tablet (50 mg total) by mouth at bedtime as needed for sleep.   vitamin B-12 500 MCG tablet Commonly known as: CYANOCOBALAMIN Take 500 mcg by mouth daily.   VITAMIN D (CHOLECALCIFEROL) PO Take 1 tablet by mouth daily.        Discharge Exam: Filed Weights   08/06/21 0110 08/07/21 0431 08/08/21 0515  Weight: 93.4 kg 92.1 kg 90.7 kg   General: Alert and oriented x3, no acute distress Cardiovascular: Irregular rhythm, rate controlled Lungs: Decreased breath sounds throughout secondary to body habitus  Condition at discharge: good Being discharged to [facility] with palliative care to follow.  She remains at high risk for readmission  The results of significant diagnostics from this hospitalization (including imaging, microbiology, ancillary and laboratory) are listed below for reference.   Imaging Studies: DG Chest 2 View  Result Date: 08/03/2021 CLINICAL DATA:  Provided history: Shortness of  breath. Additional history provided: Patient reports significant weight gain overnight, chest pain, shortness of breath. EXAM: CHEST - 2 VIEW COMPARISON:  Prior chest radiographs 05/28/2021 and earlier. CT angiogram chest 02/05/2021. FINDINGS: Cardiomegaly, unchanged. Aortic atherosclerosis. Central pulmonary vascular congestion. Prominence of the interstitial lung markings in the perihilar regions and lung bases, which is suspicious for interstitial edema. No appreciable airspace consolidation. No evidence of pleural effusion or pneumothorax. No acute bony abnormality identified IMPRESSION: Cardiomegaly with central pulmonary vascular congestion and suspected interstitial edema. Aortic Atherosclerosis (ICD10-I70.0). Electronically Signed   By: Kellie Simmering D.O.   On: 08/03/2021 13:05   ECHOCARDIOGRAM COMPLETE  Result Date: 08/04/2021  ECHOCARDIOGRAM REPORT   Patient Name:   Karen [name] Date of Exam: 08/04/2021 Medical Rec #:  [id number]        Height:       63.0 in Accession #:    [id number]       Weight:       213.0 lb Date of Birth:  [birth year]       BSA:          1.986 m Patient Age:    76 years         BP:           97/54 mmHg Patient Gender: F                HR:           64 bpm. Exam Location:  [facility] Procedure: 2D Echo, Color Doppler, Cardiac Doppler and Intracardiac            Opacification Agent Indications:     R07.9 Chest Pain  History:         Patient has prior history of Echocardiogram examinations, most                  recent 02/06/2021. HFpEF, CKD and COPD; Risk                  Factors:Hypertension, Diabetes and Dyslipidemia.  Sonographer:     Charmayne Sheer Referring Phys:  [place] Diagnosing Phys: Nelva Bush MD  Sonographer Comments: Suboptimal apical window. IMPRESSIONS  1. Left ventricular ejection fraction, by estimation, is 55 to 60%. The left ventricle has normal function. The left ventricle has no regional wall motion abnormalities. Left ventricular diastolic parameters  are consistent with Grade III diastolic dysfunction (restrictive). Elevated left atrial pressure. There is the interventricular septum is flattened in systole, consistent with right ventricular pressure overload.  2. Right ventricular systolic function is mildly reduced. The right ventricular size is moderately enlarged. There is moderately elevated pulmonary artery systolic pressure.  3. Left atrial size was mildly dilated.  4. The mitral valve is degenerative. Mild mitral valve regurgitation. No evidence of mitral stenosis.  5. Tricuspid valve regurgitation is moderate.  6. The aortic valve has an indeterminant number of cusps. There is mild calcification of the aortic valve. There is moderate thickening of the aortic valve. Aortic valve regurgitation is not visualized. Mild to moderate aortic valve stenosis. Aortic valve area, by VTI measures 1.20 cm. Aortic valve mean gradient measures 10.3 mmHg.  7. The inferior vena cava is dilated in size with <50% respiratory variability, suggesting right atrial pressure of 15 mmHg. FINDINGS  Left Ventricle: Left ventricular ejection fraction, by estimation, is 55 to 60%. The left ventricle has normal function. The left ventricle has no regional wall motion abnormalities. Definity contrast agent was given IV to delineate the left ventricular  endocardial borders. The left ventricular internal cavity size was normal in size. There is no left ventricular hypertrophy. The interventricular septum is flattened in systole, consistent with right ventricular pressure overload. Left ventricular diastolic parameters are consistent with Grade III diastolic dysfunction (restrictive). Elevated left atrial pressure. Right Ventricle: The right ventricular size is moderately enlarged. No increase in right ventricular wall thickness. Right ventricular systolic function is mildly reduced. There is moderately elevated pulmonary artery systolic pressure. The tricuspid regurgitant velocity is  3.34 m/s, and with an assumed right atrial pressure of 15 mmHg, the estimated right ventricular systolic pressure is 42.7 mmHg. Left Atrium:  Left atrial size was mildly dilated. Right Atrium: Right atrial size was normal in size. Pericardium: Trivial pericardial effusion is present. Mitral Valve: The mitral valve is degenerative in appearance. There is mild calcification of the mitral valve leaflet(s). Mild mitral annular calcification. Mild mitral valve regurgitation. No evidence of mitral valve stenosis. MV peak gradient, 6.1 mmHg. The mean mitral valve gradient is 2.0 mmHg. Tricuspid Valve: The tricuspid valve is normal in structure. Tricuspid valve regurgitation is moderate. Aortic Valve: The aortic valve has an indeterminant number of cusps. There is mild calcification of the aortic valve. There is moderate thickening of the aortic valve. Aortic valve regurgitation is not visualized. Mild to moderate aortic stenosis is present. Aortic valve mean gradient measures 10.3 mmHg. Aortic valve peak gradient measures 17.3 mmHg. Aortic valve area, by VTI measures 1.20 cm. Pulmonic Valve: The pulmonic valve was not well visualized. Pulmonic valve regurgitation is not visualized. No evidence of pulmonic stenosis. Aorta: The aortic root is normal in size and structure. Pulmonary Artery: The pulmonary artery is of normal size. Venous: The inferior vena cava is dilated in size with less than 50% respiratory variability, suggesting right atrial pressure of 15 mmHg. IAS/Shunts: No atrial level shunt detected by color flow Doppler.  LEFT VENTRICLE PLAX 2D LVIDd:         3.86 cm   Diastology LVIDs:         2.90 cm   LV e' medial:    5.87 cm/s LV PW:         0.98 cm   LV E/e' medial:  20.8 LV IVS:        0.83 cm   LV e' lateral:   8.92 cm/s LVOT diam:     1.80 cm   LV E/e' lateral: 13.7 LV SV:         49 LV SV Index:   25 LVOT Area:     2.54 cm  RIGHT VENTRICLE RV Basal diam:  4.94 cm RV S prime:     9.68 cm/s LEFT ATRIUM              Index        RIGHT ATRIUM           Index LA diam:        3.90 cm 1.96 cm/m   RA Area:     16.40 cm LA Vol (A2C):   65.2 ml 32.83 ml/m  RA Volume:   40.60 ml  20.44 ml/m LA Vol (A4C):   60.9 ml 30.67 ml/m LA Biplane Vol: 64.7 ml 32.58 ml/m  AORTIC VALVE                     PULMONIC VALVE AV Area (Vmax):    1.15 cm      PV Vmax:       1.10 m/s AV Area (Vmean):   1.04 cm      PV Vmean:      77.900 cm/s AV Area (VTI):     1.20 cm      PV VTI:        0.196 m AV Vmax:           207.67 cm/s   PV Peak grad:  4.8 mmHg AV Vmean:          151.000 cm/s  PV Mean grad:  3.0 mmHg AV VTI:            0.408 m AV Peak Grad:  17.3 mmHg AV Mean Grad:      10.3 mmHg LVOT Vmax:         94.00 cm/s LVOT Vmean:        62.000 cm/s LVOT VTI:          0.193 m LVOT/AV VTI ratio: 0.47  AORTA Ao Root diam: 2.60 cm MITRAL VALVE                TRICUSPID VALVE MV Area (PHT): 3.39 cm     TR Peak grad:   44.6 mmHg MV Area VTI:   1.25 cm     TR Vmax:        334.00 cm/s MV Peak grad:  6.1 mmHg MV Mean grad:  2.0 mmHg     SHUNTS MV Vmax:       1.23 m/s     Systemic VTI:  0.19 m MV Vmean:      54.9 cm/s    Systemic Diam: 1.80 cm MV Decel Time: 224 msec MV E velocity: 122.00 cm/s MV A velocity: 34.30 cm/s MV E/A ratio:  3.56 Harrell Gave End MD Electronically signed by Nelva Bush MD Signature Date/Time: 08/04/2021/4:33:28 PM    Final     Microbiology: Results for orders placed or performed during the hospital encounter of 05/28/21  Resp Panel by RT-PCR (Flu A&B, Covid) Nasopharyngeal Swab     Status: None   Collection Time: 06/02/21  3:10 PM   Specimen: Nasopharyngeal Swab; Nasopharyngeal(NP) swabs in vial transport medium  Result Value Ref Range Status   SARS Coronavirus 2 by RT PCR NEGATIVE NEGATIVE Final    Comment: (NOTE) SARS-CoV-2 target nucleic acids are NOT DETECTED.  The SARS-CoV-2 RNA is generally detectable in upper respiratory specimens during the acute phase of infection. The lowest concentration of  SARS-CoV-2 viral copies this assay can detect is 138 copies/mL. A negative result does not preclude SARS-Cov-2 infection and should not be used as the sole basis for treatment or other patient management decisions. A negative result may occur with  improper specimen collection/handling, submission of specimen other than nasopharyngeal swab, presence of viral mutation(s) within the areas targeted by this assay, and inadequate number of viral copies(<138 copies/mL). A negative result must be combined with clinical observations, patient history, and epidemiological information. The expected result is Negative.  Fact Sheet for Patients:  EntrepreneurPulse.com.au  Fact Sheet for Healthcare Providers:  IncredibleEmployment.be  This test is no t yet approved or cleared by the [place] FDA and  has been authorized for detection and/or diagnosis of SARS-CoV-2 by FDA under an Emergency Use Authorization (EUA). This EUA will remain  in effect (meaning this test can be used) for the duration of the COVID-19 declaration under Section 564(b)(1) of the Act, 21 U.S.C.section 360bbb-3(b)(1), unless the authorization is terminated  or revoked sooner.       Influenza A by PCR NEGATIVE NEGATIVE Final   Influenza B by PCR NEGATIVE NEGATIVE Final    Comment: (NOTE) The Xpert Xpress SARS-CoV-2/FLU/RSV plus assay is intended as an aid in the diagnosis of influenza from Nasopharyngeal swab specimens and should not be used as a sole basis for treatment. Nasal washings and aspirates are unacceptable for Xpert Xpress SARS-CoV-2/FLU/RSV testing.  Fact Sheet for Patients: EntrepreneurPulse.com.au  Fact Sheet for Healthcare Providers: IncredibleEmployment.be  This test is not yet approved or cleared by the [place] FDA and has been authorized for detection and/or diagnosis of SARS-CoV-2 by FDA under an Emergency Use  Authorization (EUA). This EUA  will remain in effect (meaning this test can be used) for the duration of the COVID-19 declaration under Section 564(b)(1) of the Act, 21 U.S.C. section 360bbb-3(b)(1), unless the authorization is terminated or revoked.  Performed at [facility], [address] [place], [place] [address]     Labs: CBC: Recent Labs  Lab 08/03/21 1324 08/04/21 0617 08/06/21 0448  WBC 20.0* 14.1* 12.2*  HGB 10.3* 9.4* 10.0*  HCT 31.3* 29.8* 31.8*  MCV 87.9 90.0 90.1  PLT 273 250 364   Basic Metabolic Panel: Recent Labs  Lab 08/04/21 0617 08/05/21 0535 08/06/21 0448 08/07/21 0421 08/08/21 0620  NA 130* 136 135 137 139  K 3.7 3.3* 4.0 3.6 4.3  CL 87* 93* 89* 90* 90*  CO2 34* 34* 36* 38* 37*  GLUCOSE 282* 228* 208* 227* 238*  BUN 51* 48* 38* 37* 44*  CREATININE 1.46* 1.01* 1.03* 1.10* 1.29*  CALCIUM 8.9 9.1 9.3 8.8* 9.1  MG  --  2.0  --   --   --    Liver Function Tests: Recent Labs  Lab 08/05/21 0535  AST 9*  ALT 11  ALKPHOS 63  BILITOT 0.9  PROT 6.8  ALBUMIN 3.0*   CBG: Recent Labs  Lab 08/07/21 1615 08/07/21 2146 08/07/21 2214 08/08/21 0839 08/08/21 1246  GLUCAP 259* 225* 227* 266* 278*    Discharge time spent: Greater than 30 minutes.  Signed: Annita Brod, MD [facility] 08/08/2021

## 2021-08-08 NOTE — Assessment & Plan Note (Signed)
Rate controlled with Cardizem.  Initially patient placed on Cardizem 120 by cardiology but noted to have some mild bradycardia by day of discharge so changed to 60 mg p.o. twice daily

## 2021-08-08 NOTE — Assessment & Plan Note (Signed)
Initially received Lasix, but had to hold due to soft blood pressures.  After blood pressure improved with gentle IV fluids, fluids stopped and started on Lasix and since then she has diuresed over 8 L.  Renal function has improved since admission.  Cardiology following and started Jardiance 6/2.  Follow-up echocardiogram notes grade 3 diastolic dysfunction and preserved ejection fraction with moderate tricuspid regurg and moderate pulmonary hypertension.   Net IO Since Admission: -10,943 mL [08/08/21 1417]   Cardiology have transitioned her to oral torsemide.  Started oral Cardizem CD and Aldactone.  Stopping losartan at discharge.

## 2021-08-08 NOTE — Assessment & Plan Note (Signed)
Contributing factor to her CHF decompensation.  Patient has been on CPAP here, but was supposed to get an outpatient sleep study and CPAP approval at home, but due to delays, not yet done.  Discussed with case management and we are trying to get patient approved for trilogy device.  If accepted, device should be delivered to patient's home before she has been discharged from skilled nursing rehab.

## 2021-08-08 NOTE — TOC Progression Note (Signed)
Transition of Care [facility]) - Progression Note    Patient Details  Name: FILIPPA YARBOUGH MRN: [id number] Date of Birth: 1945-08-10  Transition [facility] CM/SW Contact  Laurena Slimmer, RN Phone Number: 08/08/2021, 4:45 PM  Clinical Narrative:    Candace Cruise started for SNF   Expected Discharge Plan: [place] Barriers to Discharge: Continued Medical Work up  Expected Discharge Plan and Services Expected Discharge Plan: [place] arrangements for the past 2 months: Single Family Home Expected Discharge Date: 08/08/21                                     Social Determinants of Health (SDOH) Interventions    Readmission Risk Interventions    10/30/2020    4:17 PM 06/21/2020    3:03 PM 09/30/2019   11:16 AM  Readmission Risk Prevention Plan  Transportation Screening Complete Complete Complete  PCP or Specialist Appt within 3-5 Days   Complete  Social Work Consult for [place] Planning/Counseling   Complete  Palliative Care Screening   Not Applicable  Medication Review [employer] photographer) Complete Complete Complete  PCP or Specialist appointment within 3-5 days of discharge Complete Complete   HRI or Home Care Consult Patient refused Complete   SW Recovery Care/Counseling Consult Complete Complete   Palliative Care Screening Not Applicable Not [place] Not Applicable Not Applicable

## 2021-08-08 NOTE — Assessment & Plan Note (Signed)
As above.

## 2021-08-08 NOTE — Progress Notes (Signed)
Physical Therapy Treatment Patient Details Name: Karen [name] MRN: [id number] DOB: February 07, 1946 Today's Date: 08/08/2021   History of Present Illness Pt is a 76 yo female that presented to the ED due to SOB, dyspnea upon exertion, weight gain. Workup for potential acute on chronic CHF. PMH of CHF, COPD, sleep apnea, CKDII/III, HLD, DM, former tobacco use, PAF on eliquis, pulmonary HTN.    PT Comments    Patient alert, oriented x4, initially denied pain about report chronic bilateral knee pain with mobility. The patient did demonstrate improvement towards goals today, but is still limited by her activity tolerance/endurance. Supine to sit CGA with bed rails. Sit <> stand from EOB and from recliner, CGA with RW, effortful for pt. Two extended seated rest breaks needed after each bout of mobility but she was able to ambulate ~7ft.Effortful for pt, slow velocity, cued for PLB, spO2 reading 84% on 3L but recovered to 90% within 1 minute of seated rest. Overall the patient would benefit from continued skilled PT intervention to return to PLOF and maximize strength, endurance, and activity tolerance.       Recommendations for follow up therapy are one component of a multi-disciplinary discharge planning process, led by the attending physician.  Recommendations may be updated based on patient status, additional functional criteria and insurance authorization.  Follow Up Recommendations  Skilled nursing-short term rehab (<3 hours/day)     Assistance Recommended at Discharge    Patient can return home with the following A little help with bathing/dressing/bathroom;Assistance with cooking/housework;Assist for transportation;Help with stairs or ramp for entrance;Direct supervision/assist for medications management;A little help with walking and/or transfers   Equipment Recommendations  Other (comment) (TBD at next venue of care)    Recommendations for Other Services       Precautions /  Restrictions Precautions Precautions: Fall Restrictions Weight Bearing Restrictions: No     Mobility  Bed Mobility Overal bed mobility: Needs Assistance Bed Mobility: Supine to Sit     Supine to sit: Min guard     General bed mobility comments: reliance on bed rails, extra time    Transfers Overall transfer level: Needs assistance Equipment used: Rolling walker (2 wheels) Transfers: Sit to/from Stand Sit to Stand: Min guard                Ambulation/Gait Ambulation/Gait assistance: Min guard Gait Distance (Feet):  (3ft, and then additional 53ft) Assistive device: Rolling walker (2 wheels)   Gait velocity: decreased     General Gait Details: pt able to step pivot to recliner, and after a seated rest break ambulate ~51ft in room. effortful for pt, slow, cued for PLB, spO2 reading 84% on 3L.   Stairs             Wheelchair Mobility    Modified Rankin (Stroke Patients Only)       Balance Overall balance assessment: Needs assistance Sitting-balance support: Feet supported Sitting balance-Leahy Scale: Fair     Standing balance support: Reliant on assistive device for balance Standing balance-Leahy Scale: Fair                              Cognition Arousal/Alertness: Awake/alert Behavior During Therapy: WFL for tasks assessed/performed Overall Cognitive Status: Within Functional Limits for tasks assessed  Exercises      General Comments        Pertinent Vitals/Pain Pain Assessment Pain Assessment: Faces Faces Pain Scale: Hurts a little bit Pain Location: bilateral knees Pain Descriptors / Indicators: Aching, Sore, Tightness Pain Intervention(s): Limited activity within patient's tolerance, Monitored during session, Repositioned    Home Living                          Prior Function            PT Goals (current goals can now be found in the care plan  section) Progress towards PT goals: Progressing toward goals    Frequency    Min 2X/week      PT Plan Current plan remains appropriate    Co-evaluation              AM-PAC PT "6 Clicks" Mobility   Outcome Measure  Help needed turning from your back to your side while in a flat bed without using bedrails?: A Little Help needed moving from lying on your back to sitting on the side of a flat bed without using bedrails?: A Little Help needed moving to and from a bed to a chair (including a wheelchair)?: A Little Help needed standing up from a chair using your arms (e.g., wheelchair or bedside chair)?: A Little Help needed to walk in hospital room?: A Little Help needed climbing 3-5 steps with a railing? : A Lot 6 Click Score: 17    End of Session Equipment Utilized During Treatment: Gait belt Activity Tolerance: Patient limited by fatigue Patient left: with call bell/phone within reach;in chair;with chair alarm set Nurse Communication: Mobility status PT Visit Diagnosis: Other abnormalities of gait and mobility (R26.89);Difficulty in walking, not elsewhere classified (R26.2)     Time: 0940-1005 PT Time Calculation (min) (ACUTE ONLY): 25 min  Charges:  $Therapeutic Activity: 23-37 mins                     Lieutenant Diego PT, DPT 10:59 AM,08/08/21

## 2021-08-08 NOTE — Assessment & Plan Note (Signed)
127 on admission.  In the setting of hypervolemia, resolved with diuresis

## 2021-08-08 NOTE — Progress Notes (Signed)
Pt refuses CPAP. Pt aware that a CPAP will be made available if needed.

## 2021-08-08 NOTE — Assessment & Plan Note (Signed)
History of stage IIIa chronic kidney disease.  Creatinine on admission at 1.06 with GFR 54 but following Lasix, and peaked as high as 1.46, likely from hypotension, but since then has improved.  Creatinine on day of discharge slightly up at 1.29 likely from overdiuresis, but now Lasix has been stopped and patient put on p.o. diuretics  Lab Results  Component Value Date   CREATININE 1.29 (H) 08/08/2021   CREATININE 1.10 (H) 08/07/2021   CREATININE 1.03 (H) 08/06/2021

## 2021-08-08 NOTE — Progress Notes (Signed)
Inpatient Diabetes Program Recommendations  AACE/ADA: New Consensus Statement on Inpatient Glycemic Control (2015)  Target Ranges:  Prepandial:   less than 140 mg/dL      Peak postprandial:   less than 180 mg/dL (1-2 hours)      Critically ill patients:  140 - 180 mg/dL   Lab Results  Component Value Date   GLUCAP 278 (H) 08/08/2021   HGBA1C 9.1 (H) 08/08/2021    Review of Glycemic Control  Latest Reference Range & Units 08/07/21 07:52 08/07/21 11:35 08/07/21 16:15 08/07/21 21:46 08/07/21 22:14 08/08/21 08:39 08/08/21 12:46  Glucose-Capillary 70 - 99 mg/dL 220 (H) 237 (H) 259 (H) 225 (H) 227 (H) 266 (H) 278 (H)  (H): Data is abnormally high  Inpatient Diabetes Program Recommendations:   Please consider: -Increase Novolog meal coverage to 7 units tid if eats 50%  Thank you, Bethena Roys E. Gracyn Allor, RN, MSN, CDE  Diabetes Coordinator Inpatient Glycemic Control Team Team Pager [phone] (8am-5pm) 08/08/2021 1:47 PM

## 2021-08-08 NOTE — Care Management Important Message (Signed)
Important Message  Patient Details  Name: Karen [name] MRN: [id number] Date of Birth: Apr 04, 1945   Medicare Important Message Given:  Yes     [name] 08/08/2021, 12:07 PM

## 2021-08-08 NOTE — Assessment & Plan Note (Signed)
Slowly trending up her Lantus.  She normally takes 45 units once a day.  Lantus has been slowly titrated upward since hospitalization.  Some of her hyperglycemia may have been due to uncontrolled pulmonary hypertension and volume overload.  Blood sugars have been better so have increased her to 18 units twice a day by day of discharge and changed her 3 times daily NovoLog to 5 units.  Diabetes coordinator recommending an increase to 12 units of NovoLog 3 times daily, which was initiated 6/6

## 2021-08-09 LAB — GLUCOSE, CAPILLARY
Glucose-Capillary: 240 mg/dL — ABNORMAL HIGH (ref 70–99)
Glucose-Capillary: 229 mg/dL — ABNORMAL HIGH (ref 70–99)
Glucose-Capillary: 242 mg/dL — ABNORMAL HIGH (ref 70–99)
Glucose-Capillary: 227 mg/dL — ABNORMAL HIGH (ref 70–99)

## 2021-08-09 MED ORDER — INSULIN ASPART 100 UNIT/ML IJ SOLN
12.0000 [IU] | Freq: Three times a day (TID) | INTRAMUSCULAR | Status: DC
  Administered 2021-08-09 – 2021-08-10 (×4): 12 [IU] via SUBCUTANEOUS
  Filled 2021-08-09 (×4): qty 1

## 2021-08-09 NOTE — TOC Progression Note (Signed)
Transition of Care [facility]) - Progression Note    Patient Details  Name: Karen [name] MRN: [id number] Date of Birth: 19-Aug-1945  Transition of Care [facility]) CM/SW Contact  Laurena Slimmer, RN Phone Number: 08/09/2021, 3:44 PM  Clinical Narrative:    Damaris Schooner with Audry Pili at [place] regarding [place]. Candace Cruise still pending approval.    Expected Discharge Plan: [place] Barriers to Discharge: Continued Medical Work up  Expected Discharge Plan and Services Expected Discharge Plan: [place] arrangements for the past 2 months: Single Family Home Expected Discharge Date: 08/08/21                                     Social Determinants of Health (SDOH) Interventions    Readmission Risk Interventions    10/30/2020    4:17 PM 06/21/2020    3:03 PM 09/30/2019   11:16 AM  Readmission Risk Prevention Plan  Transportation Screening Complete Complete Complete  PCP or Specialist Appt within 3-5 Days   Complete  Social Work Consult for [place] Planning/Counseling   Complete  Palliative Care Screening   Not Applicable  Medication Review Press photographer) Complete Complete Complete  PCP or Specialist appointment within 3-5 days of discharge Complete Complete   HRI or Home Care Consult Patient refused Complete   SW Recovery Care/Counseling Consult Complete Complete   Palliative Care Screening Not Applicable Not [place] Not Applicable Not Applicable

## 2021-08-09 NOTE — Progress Notes (Signed)
OT Cancellation Note  Patient Details Name: Karen [name] MRN: [id number] DOB: Oct 31, 1945   Cancelled Treatment:    Reason Eval/Treat Not Completed: Fatigue/lethargy limiting ability to participate;Patient declined, no reason specified. Upon attempt, pt in bed, blanket pulled over her head. Pt endorses feeling sleepy and declines OT tx despite encouragement. When asked if she would be agreeable to re-attempt tomorrow she stated, "maybe." Will re-attempt as schedule permits.   Ardeth Perfect., MPH, MS, OTR/L ascom [phone] 08/09/21, 2:18 PM

## 2021-08-09 NOTE — Progress Notes (Signed)
[facility] Progress Note  Patient: Karen [name]    ZHG:[id number]  DOA: 08/03/2021    Date of Service: the patient was seen and examined on 08/09/2021  Brief hospital course: 76 year old female with past medical history of diastolic CHF, morbid obesity, COPD respiratory failure on 3 L nasal cannula, obstructive sleep apnea not on CPAP, diabetes mellitus type 2 poorly controlled and paroxysmal atrial fibrillation presented to the emergency room with shortness of breath.  Patient called the emergency room physician that she had gained 40 pounds overnight and in review of her dry weight from last hospital discharge, this appeared to be correct.  Patient admitted to the hospitalist service and cardiology consulted.  Started on IV Lasix.    After some initial hypotension, patient has since improved, diuresing over 12 L and is almost 11 L deficient.  Cardiology has transitioned her to Cardizem Aldactone and torsemide and discontinued her losartan.  Patient doing much better and seen by physical therapy who recommended skilled nursing.  Patient currently waiting for insurance approval.  Assessment and Plan: Assessment and Plan: Acute on chronic diastolic CHF (congestive heart failure) ([place]) Initially received Lasix, but had to hold due to soft blood pressures.  After blood pressure improved with gentle IV fluids, fluids stopped and started on Lasix and since then she has diuresed over 12 L and is almost -11 L deficient.  Renal function has improved since admission.  Cardiology following and started Jardiance 6/2.  Follow-up echocardiogram notes grade 3 diastolic dysfunction and preserved ejection fraction with moderate tricuspid regurg and moderate pulmonary hypertension.   Cardiology have transitioned her to oral torsemide.  Started oral Cardizem CD and Aldactone.  Stopping losartan at discharge.   Severe pulmonary hypertension (HCC) As above  Chronic respiratory failure with hypoxia  (HCC) On baseline 3 L nasal cannula.  Acute kidney injury superimposed on CKD ([place]) History of stage IIIa chronic kidney disease.  Creatinine on admission at 1.06 with GFR 54 but following Lasix, and peaked as high as 1.46, likely from hypotension, but since then has improved.  Creatinine on 6/5 slightly up at 1.29 likely from overdiuresis, but now Lasix has been stopped and patient put on p.o. diuretics.  Recheck labs in the morning  Lab Results  Component Value Date   CREATININE 1.29 (H) 08/08/2021   CREATININE 1.10 (H) 08/07/2021   CREATININE 1.03 (H) 08/06/2021    OSA (obstructive sleep apnea) Contributing factor to her CHF decompensation.  Patient has been on CPAP here, but was supposed to get an outpatient sleep study and CPAP approval at home, but due to delays, not yet done.  Discussed with case management and we are trying to get patient approved for [facility] device.  If accepted, device should be delivered to patient's home before she has been discharged from skilled nursing rehab.  Uncontrolled type 2 diabetes mellitus with hyperglycemia, with long-term current use of insulin (HCC) Slowly trending up her Lantus.  She normally takes 45 units once a day.  Lantus has been slowly titrated upward since hospitalization.  Some of her hyperglycemia may have been due to uncontrolled pulmonary hypertension and volume overload.  Blood sugars have been better so have increased her to 18 units twice a day by day of discharge and changed her 3 times daily NovoLog to 5 units.  Diabetes coordinator recommending an increase to 12 units of NovoLog 3 times daily, which was initiated 6/6  AF (paroxysmal atrial fibrillation) ([place]) Rate controlled with Cardizem.  Initially patient  placed on Cardizem 120 by cardiology but noted to have some mild bradycardia by day of discharge so changed to 60 mg p.o. twice daily  Hyponatremia-resolved as of 08/05/2021 127 on admission.  In the setting of hypervolemia,  resolved with diuresis  Leucocytosis Looks to be stress margination.  White blood cell count dropped from 20 down to 12.2 without any antibiotic treatment.  No evidence of infection  Anemia in chronic kidney disease Hemoglobin remained stable.  Morbid obesity ([place]) Meets criteria with BMI greater than 35 and comorbidity of CHF.       Body mass index is 35.73 kg/m.        Consultants: Cardiology  Procedures: Echocardiogram done 6/1: Grade 3 diastolic dysfunction.  Moderate pulmonary hypertension and tricuspid regurg  Antimicrobials: None  Code Status: DNR   Subjective: Patient feeling okay, no shortness of breath Objective:  Vitals:   08/09/21 0750 08/09/21 1148  BP: (!) 134/51 (!) 117/54  Pulse: 72 61  Resp: 20 19  Temp: 97.9 F (36.6 C) (!) 97.5 F (36.4 C)  SpO2: 99% 97%    Intake/Output Summary (Last 24 hours) at 08/09/2021 1504 Last data filed at 08/09/2021 1150 Gross per 24 hour  Intake --  Output 1375 ml  Net -1375 ml    Filed Weights   08/07/21 0431 08/08/21 0515 08/09/21 0516  Weight: 92.1 kg 90.7 kg 91.5 kg   Body mass index is 35.73 kg/m.  Exam:  General: Alert and oriented x3, no acute distress HEENT: Normocephalic and atraumatic, mucous membranes are slightly dry Cardiovascular: Regular rate and rhythm, S1-S2, 2 out of 6 systolic ejection murmur Respiratory: Decreased breath sounds throughout Abdomen: Soft, nontender, nondistended, hypoactive bowel sounds Musculoskeletal: No clubbing or cyanosis, trace pitting edema Skin: No skin breaks, tears or lesions Psychiatry: Appropriate, no evidence of psychoses Neurology: No focal deficits  Data Reviewed: No lab work for today  Disposition:  Status is: Inpatient Remains inpatient appropriate because: Insurance approval for skilled nursing    Anticipated discharge date: 6/7   Family Communication: Left message for husband DVT Prophylaxis:  apixaban (ELIQUIS) tablet 5 mg     Author: Annita Brod ,MD 08/09/2021 3:04 PM  To reach On-call, see care teams to locate the attending and reach out via [email or web address]. Between 7PM-7AM, please contact night-coverage If you still have difficulty reaching the attending provider, please page the [facility] (Director on Call) for [facility] on amion for assistance.

## 2021-08-09 NOTE — Progress Notes (Signed)
Inpatient Diabetes Program Recommendations  AACE/ADA: New Consensus Statement on Inpatient Glycemic Control   Target Ranges:  Prepandial:   less than 140 mg/dL      Peak postprandial:   less than 180 mg/dL (1-2 hours)      Critically ill patients:  140 - 180 mg/dL    Latest Reference Range & Units 08/08/21 08:39 08/08/21 12:46 08/08/21 16:53 08/08/21 20:59 08/09/21 07:52  Glucose-Capillary 70 - 99 mg/dL 266 (H)  Novolog 16 units  Semglee 12 units  Jardiance 10 mg 278 (H)  Novolog 16 units 242 (H)  Novolog 12 units 297 (H)  Novolog 3 units  Semglee 18 units 240 (H)  Novolog 12 units  Semglee 18 units  Jardiance 10 mg   Review of Glycemic Control  Diabetes history: DM2 Outpatient Diabetes medications: Lantus 45 units daily, Humalog 10 units TID with meals, Trulicity 3 mg Qweek, Metformin 500 mg BID Current orders for Inpatient glycemic control: Semglee 18 units BID, Jardiance 10 mg daily, Novolog 0-20 units TID with meals, Novolog 0-5 units QHS, Novolog 5 units TID with meals  Inpatient Diabetes Program Recommendations:    Insulin: Noted Semglee increased to 18 units BID on 08/08/21. Please consider increasing meal coverage to Novolog 12 units TID with meals.  Thanks, Barnie Alderman, RN, MSN, [place] Diabetes Coordinator Inpatient Diabetes Program [phone] (Team Pager from 8am to [place])

## 2021-08-10 LAB — GLUCOSE, CAPILLARY
Glucose-Capillary: 250 mg/dL — ABNORMAL HIGH (ref 70–99)
Glucose-Capillary: 254 mg/dL — ABNORMAL HIGH (ref 70–99)
Glucose-Capillary: 149 mg/dL — ABNORMAL HIGH (ref 70–99)

## 2021-08-10 NOTE — TOC Progression Note (Addendum)
Transition of Care [facility]) - Progression Note    Patient Details  Name: Karen [name] MRN: [id number] Date of Birth: 1946/03/05  Transition of Care [facility]) CM/SW Contact  Laurena Slimmer, RN Phone Number: 08/10/2021, 2:51 PM  Clinical Narrative:    Theodoro Doing, spoke with Audry Pili in admission to give authorization [phone]. EMS arranged. Nurse notified. TOC signing off.   Expected Discharge Plan: Skilled Nursing Facility Barriers to Discharge: Continued Medical Work up  Expected Discharge Plan and Services Expected Discharge Plan: [place] arrangements for the past 2 months: Single Family Home Expected Discharge Date: 08/10/21                                     Social Determinants of Health (SDOH) Interventions    Readmission Risk Interventions    10/30/2020    4:17 PM 06/21/2020    3:03 PM 09/30/2019   11:16 AM  Readmission Risk Prevention Plan  Transportation Screening Complete Complete Complete  PCP or Specialist Appt within 3-5 Days   Complete  Social Work Consult for [place] Planning/Counseling   Complete  Palliative Care Screening   Not Applicable  Medication Review Press photographer) Complete Complete Complete  PCP or Specialist appointment within 3-5 days of discharge Complete Complete   HRI or Home Care Consult Patient refused Complete   SW Recovery Care/Counseling Consult Complete Complete   Palliative Care Screening Not Applicable Not [place] Not Applicable Not Applicable

## 2021-08-10 NOTE — Progress Notes (Signed)
Inpatient Diabetes Program Recommendations  AACE/ADA: New Consensus Statement on Inpatient Glycemic Control  Target Ranges:  Prepandial:   less than 140 mg/dL      Peak postprandial:   less than 180 mg/dL (1-2 hours)      Critically ill patients:  140 - 180 mg/dL    Latest Reference Range & Units 08/09/21 07:52 08/09/21 12:05 08/09/21 16:28 08/09/21 21:04 08/10/21 08:07  Glucose-Capillary 70 - 99 mg/dL 240 (H) 229 (H) 242 (H) 227 (H) 250 (H)   Review of Glycemic Control  Diabetes history: DM2 Outpatient Diabetes medications: Lantus 45 units daily, Humalog 10 units TID with meals, Trulicity 3 mg Qweek, Metformin 500 mg BID Current orders for Inpatient glycemic control: Semglee 18 units BID, Jardiance 10 mg daily, Novolog 0-20 units TID with meals, Novolog 0-5 units QHS, Novolog 12 units TID with meals  Inpatient Diabetes Program Recommendations:    Insulin: Please consider increasing Semglee to 25 units BID.  Thanks, Barnie Alderman, RN, MSN, [place] Diabetes Coordinator Inpatient Diabetes Program [phone] (Team Pager from 8am to [place])

## 2021-08-10 NOTE — Progress Notes (Signed)
Physical Therapy Treatment Patient Details Name: Karen [name] MRN: [id number] DOB: [birth year] Today's Date: 08/10/2021   History of Present Illness Pt is a 76 yo female that presented to the ED due to SOB, dyspnea upon exertion, weight gain. Workup for potential acute on chronic CHF. PMH of CHF, COPD, sleep apnea, CKDII/III, HLD, DM, former tobacco use, PAF on eliquis, pulmonary HTN.    PT Comments    Pt received supine in bed sleeping. Awakens to voice and agreeable to PT. Initially groggy falling back to sleeping requiring light touch and voice to re-awaken. Pt on 3L/min with SPO2 >92% at rest and with mobility throughout session.  Pt reliant on minA+1 to transfer to EOB at trunk but able to stand to RW and ambulate minguard. 2 bouts of gait performed with 10' bout relying on seated rest on BSC due to SOB and LE fatigue. Independent voiding of bladder and perihygiene performed in sitting. Pt able to stand with same assist and ambulate additional 20' in room returning to seated EOB. Walking bouts demonstrating slow and effortful steps with heavy reliance of Ue's on RW for support. Performance of seated LE therex prior to returning to supine in bed with supervision and all needs in reach. D/c recs remain appropriate.    Recommendations for follow up therapy are one component of a multi-disciplinary discharge planning process, led by the attending physician.  Recommendations may be updated based on patient status, additional functional criteria and insurance authorization.  Follow Up Recommendations  Skilled nursing-short term rehab (<3 hours/day)     Assistance Recommended at Discharge    Patient can return home with the following A little help with bathing/dressing/bathroom;Assistance with cooking/housework;Assist for transportation;Help with stairs or ramp for entrance;Direct supervision/assist for medications management;A little help with walking and/or transfers   Equipment  Recommendations  Other (comment)    Recommendations for Other Services       Precautions / Restrictions Precautions Precautions: Fall Restrictions Weight Bearing Restrictions: No     Mobility  Bed Mobility Overal bed mobility: Needs Assistance Bed Mobility: Supine to Sit     Supine to sit: Min assist     General bed mobility comments: increased time and minA Patient Response: Cooperative  Transfers Overall transfer level: Needs assistance Equipment used: Rolling walker (2 wheels) Transfers: Sit to/from Stand Sit to Stand: Min guard                Ambulation/Gait Ambulation/Gait assistance: Min guard Gait Distance (Feet): 30 Feet (10' with seated res then 20') Assistive device: Rolling walker (2 wheels) Gait Pattern/deviations: Step-to pattern, Trunk flexed, Narrow base of support Gait velocity: decreased         Stairs             Wheelchair Mobility    Modified Rankin (Stroke Patients Only)       Balance Overall balance assessment: Needs assistance Sitting-balance support: Feet supported Sitting balance-Leahy Scale: Fair     Standing balance support: Reliant on assistive device for balance Standing balance-Leahy Scale: Fair                              Cognition Arousal/Alertness: Awake/alert Behavior During Therapy: WFL for tasks assessed/performed Overall Cognitive Status: Within Functional Limits for tasks assessed  Exercises General Exercises - Lower Extremity Long Arc Quad: AROM, Strengthening, Both, 5 reps, Seated Toe Raises: AROM, Strengthening, Both, 5 reps, Seated Heel Raises: AROM, Strengthening, Seated, Both, 5 reps Other Exercises Other Exercises: Education on LE therex seated EOB for muscle strengthening    General Comments General comments (skin integrity, edema, etc.): spo2>92% on 3L/min throughout      Pertinent Vitals/Pain Pain  Assessment Pain Assessment: No/denies pain    Home Living                          Prior Function            PT Goals (current goals can now be found in the care plan section) Acute Rehab PT Goals Patient Stated Goal: to breathe better PT Goal Formulation: With patient Time For Goal Achievement: 08/19/21 Potential to Achieve Goals: Good Progress towards PT goals: Progressing toward goals    Frequency    Min 2X/week      PT Plan Current plan remains appropriate    Co-evaluation              AM-PAC PT "6 Clicks" Mobility   Outcome Measure  Help needed turning from your back to your side while in a flat bed without using bedrails?: A Little Help needed moving from lying on your back to sitting on the side of a flat bed without using bedrails?: A Little Help needed moving to and from a bed to a chair (including a wheelchair)?: A Little Help needed standing up from a chair using your arms (e.g., wheelchair or bedside chair)?: A Little Help needed to walk in hospital room?: A Little Help needed climbing 3-5 steps with a railing? : A Lot 6 Click Score: 17    End of Session Equipment Utilized During Treatment: Gait belt;Oxygen Activity Tolerance: Patient limited by fatigue Patient left: with call bell/phone within reach;with chair alarm set;in bed Nurse Communication: Mobility status PT Visit Diagnosis: Other abnormalities of gait and mobility (R26.89);Difficulty in walking, not elsewhere classified (R26.2)     Time: 2575-0518 PT Time Calculation (min) (ACUTE ONLY): 23 min  Charges:  $Therapeutic Exercise: 23-37 mins            Krisinda Giovanni M. Fairly IV, PT, DPT Physical Therapist- [facility]  08/10/2021, 12:56 PM

## 2021-08-10 NOTE — Progress Notes (Signed)
PIV removed. Discharge instructions completed. Patient verbalized understanding of medication regimen, follow up appointments and discharge instructions. Patient belongings gathered and packed to discharge.  

## 2021-08-10 NOTE — Progress Notes (Signed)
Report called to Amy at [facility]. 

## 2021-08-10 NOTE — Progress Notes (Signed)
[facility]) Hospital Liaison note:  This patient is currently enrolled in [facility] outpatient-based Palliative Care. Will continue to follow for disposition.  Please call with any outpatient palliative questions or concerns.  Thank you, Lorelee Market, LPN [facility] Liaison [phone]

## 2021-08-11 ENCOUNTER — Ambulatory Visit: Payer: Medicare Other | Admitting: Family

## 2021-08-14 ENCOUNTER — Inpatient Hospital Stay
Admission: EM | Admit: 2021-08-14 | Discharge: 2021-09-07 | DRG: 871 | Disposition: A | Discharge status: Skilled Nursing Facility | Payer: Medicare Other | Source: Skilled Nursing Facility | Attending: Internal Medicine | Admitting: Internal Medicine

## 2021-08-14 ENCOUNTER — Other Ambulatory Visit: Payer: Self-pay

## 2021-08-14 ENCOUNTER — Emergency Department: Payer: Medicare Other

## 2021-08-14 DIAGNOSIS — I272 Pulmonary hypertension, unspecified: Secondary | ICD-10-CM | POA: Diagnosis present

## 2021-08-14 DIAGNOSIS — J9621 Acute and chronic respiratory failure with hypoxia: Secondary | ICD-10-CM | POA: Diagnosis present

## 2021-08-14 DIAGNOSIS — Z79899 Other long term (current) drug therapy: Secondary | ICD-10-CM

## 2021-08-14 DIAGNOSIS — Z66 Do not resuscitate: Secondary | ICD-10-CM | POA: Diagnosis present

## 2021-08-14 DIAGNOSIS — I509 Heart failure, unspecified: Secondary | ICD-10-CM | POA: Diagnosis not present

## 2021-08-14 DIAGNOSIS — I4891 Unspecified atrial fibrillation: Secondary | ICD-10-CM | POA: Diagnosis not present

## 2021-08-14 DIAGNOSIS — I130 Hypertensive heart and chronic kidney disease with heart failure and stage 1 through stage 4 chronic kidney disease, or unspecified chronic kidney disease: Secondary | ICD-10-CM | POA: Diagnosis present

## 2021-08-14 DIAGNOSIS — J189 Pneumonia, unspecified organism: Secondary | ICD-10-CM | POA: Diagnosis not present

## 2021-08-14 DIAGNOSIS — I208 Other forms of angina pectoris: Secondary | ICD-10-CM | POA: Diagnosis not present

## 2021-08-14 DIAGNOSIS — E1122 Type 2 diabetes mellitus with diabetic chronic kidney disease: Secondary | ICD-10-CM | POA: Diagnosis present

## 2021-08-14 DIAGNOSIS — Z6836 Body mass index (BMI) 36.0-36.9, adult: Secondary | ICD-10-CM

## 2021-08-14 DIAGNOSIS — E871 Hypo-osmolality and hyponatremia: Secondary | ICD-10-CM | POA: Diagnosis present

## 2021-08-14 DIAGNOSIS — A419 Sepsis, unspecified organism: Principal | ICD-10-CM | POA: Diagnosis present

## 2021-08-14 DIAGNOSIS — Z794 Long term (current) use of insulin: Secondary | ICD-10-CM

## 2021-08-14 DIAGNOSIS — I472 Ventricular tachycardia, unspecified: Secondary | ICD-10-CM | POA: Diagnosis not present

## 2021-08-14 DIAGNOSIS — N179 Acute kidney failure, unspecified: Secondary | ICD-10-CM | POA: Diagnosis present

## 2021-08-14 DIAGNOSIS — I480 Paroxysmal atrial fibrillation: Secondary | ICD-10-CM | POA: Diagnosis present

## 2021-08-14 DIAGNOSIS — I4892 Unspecified atrial flutter: Secondary | ICD-10-CM | POA: Diagnosis present

## 2021-08-14 DIAGNOSIS — J90 Pleural effusion, not elsewhere classified: Secondary | ICD-10-CM | POA: Diagnosis not present

## 2021-08-14 DIAGNOSIS — J9611 Chronic respiratory failure with hypoxia: Secondary | ICD-10-CM | POA: Diagnosis not present

## 2021-08-14 DIAGNOSIS — Z7951 Long term (current) use of inhaled steroids: Secondary | ICD-10-CM

## 2021-08-14 DIAGNOSIS — Z7189 Other specified counseling: Secondary | ICD-10-CM | POA: Diagnosis not present

## 2021-08-14 DIAGNOSIS — E876 Hypokalemia: Secondary | ICD-10-CM | POA: Diagnosis present

## 2021-08-14 DIAGNOSIS — N184 Chronic kidney disease, stage 4 (severe): Secondary | ICD-10-CM | POA: Diagnosis present

## 2021-08-14 DIAGNOSIS — E1165 Type 2 diabetes mellitus with hyperglycemia: Secondary | ICD-10-CM | POA: Diagnosis not present

## 2021-08-14 DIAGNOSIS — Y95 Nosocomial condition: Secondary | ICD-10-CM | POA: Diagnosis present

## 2021-08-14 DIAGNOSIS — N3281 Overactive bladder: Secondary | ICD-10-CM | POA: Diagnosis present

## 2021-08-14 DIAGNOSIS — K219 Gastro-esophageal reflux disease without esophagitis: Secondary | ICD-10-CM | POA: Diagnosis present

## 2021-08-14 DIAGNOSIS — G4733 Obstructive sleep apnea (adult) (pediatric): Secondary | ICD-10-CM | POA: Diagnosis not present

## 2021-08-14 DIAGNOSIS — E785 Hyperlipidemia, unspecified: Secondary | ICD-10-CM | POA: Diagnosis present

## 2021-08-14 DIAGNOSIS — Z7901 Long term (current) use of anticoagulants: Secondary | ICD-10-CM

## 2021-08-14 DIAGNOSIS — J440 Chronic obstructive pulmonary disease with acute lower respiratory infection: Secondary | ICD-10-CM | POA: Diagnosis present

## 2021-08-14 DIAGNOSIS — Z91013 Allergy to seafood: Secondary | ICD-10-CM

## 2021-08-14 DIAGNOSIS — Z7984 Long term (current) use of oral hypoglycemic drugs: Secondary | ICD-10-CM

## 2021-08-14 DIAGNOSIS — J188 Other pneumonia, unspecified organism: Secondary | ICD-10-CM | POA: Diagnosis not present

## 2021-08-14 DIAGNOSIS — J918 Pleural effusion in other conditions classified elsewhere: Secondary | ICD-10-CM | POA: Diagnosis present

## 2021-08-14 DIAGNOSIS — J449 Chronic obstructive pulmonary disease, unspecified: Secondary | ICD-10-CM | POA: Diagnosis present

## 2021-08-14 DIAGNOSIS — I5033 Acute on chronic diastolic (congestive) heart failure: Secondary | ICD-10-CM | POA: Diagnosis present

## 2021-08-14 DIAGNOSIS — R079 Chest pain, unspecified: Secondary | ICD-10-CM | POA: Diagnosis not present

## 2021-08-14 DIAGNOSIS — J181 Lobar pneumonia, unspecified organism: Secondary | ICD-10-CM | POA: Diagnosis present

## 2021-08-14 DIAGNOSIS — I3139 Other pericardial effusion (noninflammatory): Secondary | ICD-10-CM | POA: Diagnosis present

## 2021-08-14 DIAGNOSIS — N189 Chronic kidney disease, unspecified: Secondary | ICD-10-CM | POA: Diagnosis present

## 2021-08-14 DIAGNOSIS — Z87891 Personal history of nicotine dependence: Secondary | ICD-10-CM | POA: Diagnosis not present

## 2021-08-14 DIAGNOSIS — J441 Chronic obstructive pulmonary disease with (acute) exacerbation: Secondary | ICD-10-CM | POA: Diagnosis present

## 2021-08-14 DIAGNOSIS — J432 Centrilobular emphysema: Secondary | ICD-10-CM | POA: Diagnosis not present

## 2021-08-14 DIAGNOSIS — I5030 Unspecified diastolic (congestive) heart failure: Secondary | ICD-10-CM | POA: Diagnosis not present

## 2021-08-14 DIAGNOSIS — E873 Alkalosis: Secondary | ICD-10-CM | POA: Diagnosis present

## 2021-08-14 DIAGNOSIS — Z91199 Patient's noncompliance with other medical treatment and regimen due to unspecified reason: Secondary | ICD-10-CM

## 2021-08-14 DIAGNOSIS — I4821 Permanent atrial fibrillation: Secondary | ICD-10-CM | POA: Diagnosis present

## 2021-08-14 DIAGNOSIS — Z9981 Dependence on supplemental oxygen: Secondary | ICD-10-CM

## 2021-08-14 LAB — COMPREHENSIVE METABOLIC PANEL
ALT: 20 U/L (ref 0–44)
AST: 24 U/L (ref 15–41)
Albumin: 3.1 g/dL — ABNORMAL LOW (ref 3.5–5.0)
Alkaline Phosphatase: 91 U/L (ref 38–126)
Anion gap: 14 (ref 5–15)
BUN: 69 mg/dL — ABNORMAL HIGH (ref 8–23)
CO2: 25 mmol/L (ref 22–32)
Calcium: 8.9 mg/dL (ref 8.9–10.3)
Chloride: 80 mmol/L — ABNORMAL LOW (ref 98–111)
Creatinine, Ser: 1.81 mg/dL — ABNORMAL HIGH (ref 0.44–1.00)
GFR, Estimated: 28 mL/min — ABNORMAL LOW (ref >60)
Glucose, Bld: 458 mg/dL — ABNORMAL HIGH (ref 70–99)
Potassium: 4.2 mmol/L (ref 3.5–5.1)
Sodium: 119 mmol/L — CL (ref 135–145)
Total Bilirubin: 0.9 mg/dL (ref 0.3–1.2)
Total Protein: 7.1 g/dL (ref 6.5–8.1)

## 2021-08-14 LAB — CBC WITH DIFFERENTIAL/PLATELET
Abs Immature Granulocytes: 0.22 10*3/uL — ABNORMAL HIGH (ref 0.00–0.07)
Basophils Absolute: 0.1 10*3/uL (ref 0.0–0.1)
Basophils Relative: 0 %
Eosinophils Absolute: 0 10*3/uL (ref 0.0–0.5)
Eosinophils Relative: 0 %
HCT: 33.6 % — ABNORMAL LOW (ref 36.0–46.0)
Hemoglobin: 11 g/dL — ABNORMAL LOW (ref 12.0–15.0)
Immature Granulocytes: 1 %
Lymphocytes Relative: 4 %
Lymphs Abs: 0.9 10*3/uL (ref 0.7–4.0)
MCH: 28.3 pg (ref 26.0–34.0)
MCHC: 32.7 g/dL (ref 30.0–36.0)
MCV: 86.4 fL (ref 80.0–100.0)
Monocytes Absolute: 1.8 10*3/uL — ABNORMAL HIGH (ref 0.1–1.0)
Monocytes Relative: 7 %
Neutro Abs: 22.1 10*3/uL — ABNORMAL HIGH (ref 1.7–7.7)
Neutrophils Relative %: 88 %
Platelets: 346 10*3/uL (ref 150–400)
RBC: 3.89 MIL/uL (ref 3.87–5.11)
RDW: 14.6 % (ref 11.5–15.5)
Smear Review: NORMAL
WBC: 25.1 10*3/uL — ABNORMAL HIGH (ref 4.0–10.5)
nRBC: 0 % (ref 0.0–0.2)

## 2021-08-14 LAB — CBG MONITORING, ED: Glucose-Capillary: 366 mg/dL — ABNORMAL HIGH (ref 70–99)

## 2021-08-14 LAB — LACTIC ACID, PLASMA
Lactic Acid, Venous: 2.5 mmol/L (ref 0.5–1.9)
Lactic Acid, Venous: 2.2 mmol/L (ref 0.5–1.9)

## 2021-08-14 LAB — TROPONIN I (HIGH SENSITIVITY)
Troponin I (High Sensitivity): 16 ng/L (ref <18)
Troponin I (High Sensitivity): 12 ng/L (ref <18)

## 2021-08-14 LAB — GLUCOSE, CAPILLARY: Glucose-Capillary: 456 mg/dL — ABNORMAL HIGH (ref 70–99)

## 2021-08-14 LAB — BRAIN NATRIURETIC PEPTIDE: B Natriuretic Peptide: 85 pg/mL (ref 0.0–100.0)

## 2021-08-14 LAB — PROCALCITONIN: Procalcitonin: 0.31 ng/mL

## 2021-08-14 MED ORDER — SPIRONOLACTONE 25 MG PO TABS
12.5000 mg | ORAL_TABLET | Freq: Every day | ORAL | Status: DC
  Administered 2021-08-15 – 2021-08-29 (×15): 12.5 mg via ORAL
  Filled 2021-08-14: qty 1
  Filled 2021-08-14 (×4): qty 0.5
  Filled 2021-08-14 (×2): qty 1
  Filled 2021-08-14: qty 0.5
  Filled 2021-08-14 (×4): qty 1
  Filled 2021-08-14: qty 0.5
  Filled 2021-08-14: qty 1
  Filled 2021-08-14 (×2): qty 0.5
  Filled 2021-08-14: qty 1
  Filled 2021-08-14: qty 0.5
  Filled 2021-08-14: qty 1
  Filled 2021-08-14 (×2): qty 0.5
  Filled 2021-08-14 (×2): qty 1
  Filled 2021-08-14 (×2): qty 0.5
  Filled 2021-08-14: qty 1
  Filled 2021-08-14 (×2): qty 0.5
  Filled 2021-08-14 (×2): qty 1

## 2021-08-14 MED ORDER — PANTOPRAZOLE SODIUM 40 MG PO TBEC
40.0000 mg | DELAYED_RELEASE_TABLET | Freq: Every day | ORAL | Status: DC
  Administered 2021-08-15 – 2021-08-30 (×16): 40 mg via ORAL
  Filled 2021-08-14 (×16): qty 1

## 2021-08-14 MED ORDER — SODIUM CHLORIDE 0.9 % IV SOLN: 1.0000 g | INTRAVENOUS | Status: DC

## 2021-08-14 MED ORDER — FLUTICASONE PROPIONATE 50 MCG/ACT NA SUSP
2.0000 | Freq: Every day | NASAL | Status: DC
Start: 2021-08-15 — End: 2021-09-07
  Administered 2021-08-15 – 2021-08-28 (×8): 2 via NASAL
  Filled 2021-08-14: qty 16

## 2021-08-14 MED ORDER — ALBUTEROL SULFATE (2.5 MG/3ML) 0.083% IN NEBU: 3.0000 mL | INHALATION_SOLUTION | Freq: Four times a day (QID) | RESPIRATORY_TRACT | Status: DC | PRN

## 2021-08-14 MED ORDER — IPRATROPIUM-ALBUTEROL 0.5-2.5 (3) MG/3ML IN SOLN
3.0000 mL | Freq: Four times a day (QID) | RESPIRATORY_TRACT | Status: DC | PRN
  Administered 2021-08-16 – 2021-08-22 (×3): 3 mL via RESPIRATORY_TRACT
  Filled 2021-08-14 (×3): qty 3

## 2021-08-14 MED ORDER — TRAZODONE HCL 50 MG PO TABS
50.0000 mg | ORAL_TABLET | Freq: Every evening | ORAL | Status: DC | PRN
  Administered 2021-08-20 – 2021-09-06 (×7): 50 mg via ORAL
  Filled 2021-08-14 (×7): qty 1

## 2021-08-14 MED ORDER — DULAGLUTIDE 3 MG/0.5ML ~~LOC~~ SOAJ
3.0000 mg | SUBCUTANEOUS | Status: DC
Start: 2021-08-14 — End: 2021-08-15

## 2021-08-14 MED ORDER — EMPAGLIFLOZIN 10 MG PO TABS
10.0000 mg | ORAL_TABLET | Freq: Every day | ORAL | Status: DC
  Administered 2021-08-15 – 2021-09-07 (×24): 10 mg via ORAL
  Filled 2021-08-14 (×24): qty 1

## 2021-08-14 MED ORDER — ATORVASTATIN CALCIUM 10 MG PO TABS
10.0000 mg | ORAL_TABLET | Freq: Every day | ORAL | Status: DC
  Administered 2021-08-15 – 2021-09-07 (×24): 10 mg via ORAL
  Filled 2021-08-14 (×24): qty 1

## 2021-08-14 MED ORDER — OXYCODONE HCL 5 MG PO TABS
5.0000 mg | ORAL_TABLET | Freq: Once | ORAL | Status: AC
  Administered 2021-08-14: 5 mg via ORAL
  Filled 2021-08-14: qty 1

## 2021-08-14 MED ORDER — MELATONIN 5 MG PO TABS
5.0000 mg | ORAL_TABLET | Freq: Every day | ORAL | Status: DC
  Administered 2021-08-14 – 2021-09-06 (×24): 5 mg via ORAL
  Filled 2021-08-14 (×24): qty 1

## 2021-08-14 MED ORDER — SODIUM CHLORIDE 0.9 % IV SOLN
2.0000 g | INTRAVENOUS | Status: DC
  Filled 2021-08-14: qty 12.5

## 2021-08-14 MED ORDER — IPRATROPIUM BROMIDE 0.06 % NA SOLN
2.0000 | Freq: Four times a day (QID) | NASAL | Status: DC
Start: 2021-08-14 — End: 2021-08-30
  Administered 2021-08-14 – 2021-08-28 (×11): 2 via NASAL
  Filled 2021-08-14: qty 15

## 2021-08-14 MED ORDER — CEFEPIME HCL 2 G IV SOLR
2.0000 g | Freq: Once | INTRAVENOUS | Status: AC
  Administered 2021-08-14: 2 g via INTRAVENOUS
  Filled 2021-08-14: qty 12.5

## 2021-08-14 MED ORDER — ACETAMINOPHEN 500 MG PO TABS
500.0000 mg | ORAL_TABLET | Freq: Four times a day (QID) | ORAL | Status: DC | PRN
  Administered 2021-08-14 – 2021-08-29 (×10): 1000 mg via ORAL
  Administered 2021-08-31: 500 mg via ORAL
  Administered 2021-09-03 – 2021-09-06 (×2): 1000 mg via ORAL
  Filled 2021-08-14 (×5): qty 2
  Filled 2021-08-14: qty 1
  Filled 2021-08-14 (×2): qty 2
  Filled 2021-08-14: qty 1
  Filled 2021-08-14 (×4): qty 2
  Filled 2021-08-14: qty 1
  Filled 2021-08-14 (×2): qty 2

## 2021-08-14 MED ORDER — SODIUM CHLORIDE 0.9 % IV SOLN: Freq: Once | INTRAVENOUS | Status: DC

## 2021-08-14 MED ORDER — SODIUM CHLORIDE 0.9 % IV SOLN: INTRAVENOUS | Status: DC

## 2021-08-14 MED ORDER — SODIUM CHLORIDE 0.9 % IV BOLUS
1000.0000 mL | Freq: Once | INTRAVENOUS | Status: AC
  Administered 2021-08-14: 1000 mL via INTRAVENOUS

## 2021-08-14 MED ORDER — INSULIN ASPART 100 UNIT/ML IJ SOLN
0.0000 [IU] | Freq: Three times a day (TID) | INTRAMUSCULAR | Status: DC
  Administered 2021-08-15: 20 [IU] via SUBCUTANEOUS
  Administered 2021-08-15: 7 [IU] via SUBCUTANEOUS
  Administered 2021-08-15: 20 [IU] via SUBCUTANEOUS
  Administered 2021-08-16: 15 [IU] via SUBCUTANEOUS
  Administered 2021-08-16 (×2): 7 [IU] via SUBCUTANEOUS
  Administered 2021-08-17: 4 [IU] via SUBCUTANEOUS
  Administered 2021-08-17: 11 [IU] via SUBCUTANEOUS
  Administered 2021-08-17: 4 [IU] via SUBCUTANEOUS
  Administered 2021-08-18: 7 [IU] via SUBCUTANEOUS
  Administered 2021-08-18 (×2): 4 [IU] via SUBCUTANEOUS
  Administered 2021-08-19: 20 [IU] via SUBCUTANEOUS
  Administered 2021-08-19: 3 [IU] via SUBCUTANEOUS
  Administered 2021-08-19: 4 [IU] via SUBCUTANEOUS
  Administered 2021-08-20: 11 [IU] via SUBCUTANEOUS
  Administered 2021-08-20 (×2): 4 [IU] via SUBCUTANEOUS
  Administered 2021-08-21: 7 [IU] via SUBCUTANEOUS
  Administered 2021-08-21 (×2): 4 [IU] via SUBCUTANEOUS
  Administered 2021-08-22: 20 [IU] via SUBCUTANEOUS
  Administered 2021-08-23: 7 [IU] via SUBCUTANEOUS
  Administered 2021-08-23: 11 [IU] via SUBCUTANEOUS
  Administered 2021-08-24: 7 [IU] via SUBCUTANEOUS
  Administered 2021-08-24: 4 [IU] via SUBCUTANEOUS
  Administered 2021-08-24: 20 [IU] via SUBCUTANEOUS
  Administered 2021-08-25: 7 [IU] via SUBCUTANEOUS
  Administered 2021-08-25: 15 [IU] via SUBCUTANEOUS
  Administered 2021-08-26: 20 [IU] via SUBCUTANEOUS
  Administered 2021-08-26 – 2021-08-27 (×3): 4 [IU] via SUBCUTANEOUS
  Administered 2021-08-27: 7 [IU] via SUBCUTANEOUS
  Administered 2021-08-28: 4 [IU] via SUBCUTANEOUS
  Administered 2021-08-28: 7 [IU] via SUBCUTANEOUS
  Administered 2021-08-28 – 2021-08-29 (×3): 4 [IU] via SUBCUTANEOUS
  Administered 2021-08-29 – 2021-08-30 (×2): 7 [IU] via SUBCUTANEOUS
  Administered 2021-08-30: 11 [IU] via SUBCUTANEOUS
  Administered 2021-08-30 – 2021-08-31 (×2): 3 [IU] via SUBCUTANEOUS
  Administered 2021-08-31: 20 [IU] via SUBCUTANEOUS
  Administered 2021-08-31: 15 [IU] via SUBCUTANEOUS
  Administered 2021-09-01: 7 [IU] via SUBCUTANEOUS
  Administered 2021-09-01: 20 [IU] via SUBCUTANEOUS
  Administered 2021-09-01: 3 [IU] via SUBCUTANEOUS
  Administered 2021-09-02: 4 [IU] via SUBCUTANEOUS
  Administered 2021-09-02 (×2): 11 [IU] via SUBCUTANEOUS
  Administered 2021-09-03: 7 [IU] via SUBCUTANEOUS
  Administered 2021-09-03: 15 [IU] via SUBCUTANEOUS
  Administered 2021-09-04: 11 [IU] via SUBCUTANEOUS
  Administered 2021-09-04 (×2): 3 [IU] via SUBCUTANEOUS
  Administered 2021-09-05 (×2): 4 [IU] via SUBCUTANEOUS
  Administered 2021-09-05: 15 [IU] via SUBCUTANEOUS
  Administered 2021-09-06: 11 [IU] via SUBCUTANEOUS
  Administered 2021-09-06 – 2021-09-07 (×2): 4 [IU] via SUBCUTANEOUS
  Filled 2021-08-14 (×62): qty 1

## 2021-08-14 MED ORDER — FENTANYL CITRATE PF 50 MCG/ML IJ SOSY
50.0000 ug | PREFILLED_SYRINGE | Freq: Once | INTRAMUSCULAR | Status: AC
  Administered 2021-08-14: 50 ug via INTRAVENOUS
  Filled 2021-08-14: qty 1

## 2021-08-14 MED ORDER — INSULIN ASPART 100 UNIT/ML IJ SOLN
8.0000 [IU] | Freq: Once | INTRAMUSCULAR | Status: AC
  Administered 2021-08-14: 8 [IU] via INTRAVENOUS
  Filled 2021-08-14: qty 1

## 2021-08-14 MED ORDER — FUROSEMIDE 10 MG/ML IJ SOLN
60.0000 mg | Freq: Once | INTRAMUSCULAR | Status: AC
  Administered 2021-08-14: 60 mg via INTRAVENOUS
  Filled 2021-08-14: qty 8

## 2021-08-14 MED ORDER — VITAMIN B-12 1000 MCG PO TABS
500.0000 ug | ORAL_TABLET | Freq: Every day | ORAL | Status: DC
  Administered 2021-08-14 – 2021-08-29 (×16): 500 ug via ORAL
  Filled 2021-08-14 (×16): qty 1

## 2021-08-14 MED ORDER — SENNOSIDES-DOCUSATE SODIUM 8.6-50 MG PO TABS
1.0000 | ORAL_TABLET | Freq: Two times a day (BID) | ORAL | Status: DC | PRN
  Administered 2021-08-15 – 2021-08-18 (×2): 1 via ORAL
  Filled 2021-08-14 (×2): qty 1

## 2021-08-14 MED ORDER — INSULIN GLARGINE-YFGN 100 UNIT/ML ~~LOC~~ SOLN
18.0000 [IU] | Freq: Two times a day (BID) | SUBCUTANEOUS | Status: DC
Start: 2021-08-14 — End: 2021-08-15
  Administered 2021-08-14 – 2021-08-15 (×2): 18 [IU] via SUBCUTANEOUS
  Filled 2021-08-14 (×3): qty 0.18

## 2021-08-14 MED ORDER — LIDOCAINE 5 % EX PTCH
1.0000 | MEDICATED_PATCH | CUTANEOUS | Status: DC
  Administered 2021-08-14 – 2021-08-30 (×17): 1 via TRANSDERMAL
  Filled 2021-08-14 (×17): qty 1

## 2021-08-14 MED ORDER — ALLOPURINOL 100 MG PO TABS
100.0000 mg | ORAL_TABLET | Freq: Every day | ORAL | Status: DC
  Administered 2021-08-15 – 2021-09-07 (×24): 100 mg via ORAL
  Filled 2021-08-14 (×24): qty 1

## 2021-08-14 MED ORDER — INSULIN ASPART 100 UNIT/ML IJ SOLN
8.0000 [IU] | Freq: Once | INTRAMUSCULAR | Status: AC
  Administered 2021-08-14: 8 [IU] via SUBCUTANEOUS
  Filled 2021-08-14: qty 1

## 2021-08-14 MED ORDER — FUROSEMIDE 10 MG/ML IJ SOLN
40.0000 mg | Freq: Four times a day (QID) | INTRAMUSCULAR | Status: AC
  Administered 2021-08-14 – 2021-08-15 (×4): 40 mg via INTRAVENOUS
  Filled 2021-08-14 (×4): qty 4

## 2021-08-14 MED ORDER — FERROUS SULFATE 325 (65 FE) MG PO TABS
325.0000 mg | ORAL_TABLET | Freq: Every day | ORAL | Status: DC
  Administered 2021-08-15 – 2021-09-07 (×24): 325 mg via ORAL
  Filled 2021-08-14 (×24): qty 1

## 2021-08-14 MED ORDER — FLUTICASONE-UMECLIDIN-VILANT 100-62.5-25 MCG/INH IN AEPB: 1.0000 | INHALATION_SPRAY | Freq: Every day | RESPIRATORY_TRACT | Status: DC

## 2021-08-14 MED ORDER — BUDESONIDE 0.25 MG/2ML IN SUSP
0.2500 mg | Freq: Two times a day (BID) | RESPIRATORY_TRACT | Status: DC
  Administered 2021-08-15 – 2021-08-29 (×29): 0.25 mg via RESPIRATORY_TRACT
  Filled 2021-08-14 (×28): qty 2

## 2021-08-14 MED ORDER — VANCOMYCIN HCL IN DEXTROSE 1-5 GM/200ML-% IV SOLN
1000.0000 mg | Freq: Once | INTRAVENOUS | Status: AC
  Administered 2021-08-14: 1000 mg via INTRAVENOUS
  Filled 2021-08-14: qty 200

## 2021-08-14 MED ORDER — DILTIAZEM HCL-DEXTROSE 125-5 MG/125ML-% IV SOLN (PREMIX)
5.0000 mg/h | INTRAVENOUS | Status: DC
  Administered 2021-08-14: 5 mg/h via INTRAVENOUS
  Administered 2021-08-15: 7.5 mg/h via INTRAVENOUS
  Filled 2021-08-14 (×2): qty 125

## 2021-08-14 MED ORDER — MONTELUKAST SODIUM 10 MG PO TABS
10.0000 mg | ORAL_TABLET | Freq: Every day | ORAL | Status: DC
  Administered 2021-08-14 – 2021-08-28 (×15): 10 mg via ORAL
  Filled 2021-08-14 (×15): qty 1

## 2021-08-14 MED ORDER — POTASSIUM CHLORIDE 10 MEQ/100ML IV SOLN: 10.0000 meq | INTRAVENOUS | Status: AC

## 2021-08-14 MED ORDER — IRON 325 (65 FE) MG PO TABS: 1.0000 | ORAL_TABLET | Freq: Every day | ORAL | Status: DC

## 2021-08-14 MED ORDER — APIXABAN 5 MG PO TABS
5.0000 mg | ORAL_TABLET | Freq: Two times a day (BID) | ORAL | Status: DC
  Administered 2021-08-14 – 2021-08-16 (×4): 5 mg via ORAL
  Filled 2021-08-14 (×4): qty 1

## 2021-08-14 MED ORDER — DILTIAZEM HCL 25 MG/5ML IV SOLN
10.0000 mg | Freq: Once | INTRAVENOUS | Status: AC
  Administered 2021-08-14: 10 mg via INTRAVENOUS
  Filled 2021-08-14: qty 5

## 2021-08-14 MED ORDER — UMECLIDINIUM-VILANTEROL 62.5-25 MCG/ACT IN AEPB
1.0000 | INHALATION_SPRAY | Freq: Every day | RESPIRATORY_TRACT | Status: DC
  Administered 2021-08-15 – 2021-08-30 (×16): 1 via RESPIRATORY_TRACT
  Filled 2021-08-14 (×2): qty 14

## 2021-08-14 NOTE — Assessment & Plan Note (Addendum)
Last Echo 08/04/21 revealing grade III DD, tricuspid regurg. She now presents with pulmonary edema, increased SOB.  Has diuresed over 18 L and is almost - 15 L deficient.  Her weight has dropped approximately 10 pounds.  By holding her Lasix, already she has started to trend back up.  Lasix restarted on 7/1 and patient since then has had further diuresis and oxygenation continues to improve.  Oral diuretics adjusted.

## 2021-08-14 NOTE — ED Notes (Signed)
Pt mouth breathing and placed on simple face mask 5L

## 2021-08-14 NOTE — Assessment & Plan Note (Addendum)
Patient with very high glucose. She is on basal insulin, dapaglifozin.  Patient seem to have spiking blood sugars at meals.  Appreciate diabetes coordinator help continue to increase scheduled meal coverage.  As she is feeling better, appetite is improved and blood sugars have started to trend a little higher.  Short-term acting insulin with meals adjusted.

## 2021-08-14 NOTE — Subjective & Objective (Addendum)
Karen [name] is a 76 y.o. female with a past medical history of HFpEF, anemia, CKD 4, COPD, diabetes, hypertension, hyperlipidemia recently hospitalized and d/c'd 08/10/21 to rehab.  She presents to the emergency department for left chest pain chest pain starting 2 days ago but worse today.  According to the patient she was diagnosed with pneumonia 2 to 3 days ago, since that time she has been experiencing pain in her chest as well as some mild shortness of breath.  Patient states she wears 3 L of oxygen chronically currently satting 93% on 3 L during my evaluation.  Patient states pain in the chest worse when she coughs. She denies prior MI.

## 2021-08-14 NOTE — Assessment & Plan Note (Addendum)
Chronic recurring problem.  At 133 on day of discharge.

## 2021-08-14 NOTE — Assessment & Plan Note (Signed)
-   Continue home regimen 

## 2021-08-14 NOTE — ED Triage Notes (Signed)
Pt arrives today from [facility] for CP. Pt was given 3x doses sublingual nitro prior to EMS arrival. Pt endorsing L chest pain. Pt chronically on n3L [place] and currently diagnosed with PNA, PMH CHF

## 2021-08-14 NOTE — Assessment & Plan Note (Addendum)
Patient with recent hospitalization with d/c 08/10/21 to in-patient rehab. She was diagnosed with PNA by her report two days ago. Records of Abx treatment not available. On presentation she met sepsis criteria. She had tachypnea, leukocytosis, infiltrate left lobe on CXR. She was started on increased oxygen by mask. She was administered cefepime and vancomycin.

## 2021-08-14 NOTE — Progress Notes (Signed)
PHARMACY NOTE:  ANTIMICROBIAL RENAL DOSAGE ADJUSTMENT  Current antimicrobial regimen includes a mismatch between antimicrobial dosage and estimated renal function.  As per policy approved by the Pharmacy & Therapeutics and Medical Executive Committees, the antimicrobial dosage will be adjusted accordingly.  Current antimicrobial dosage:  Cefepime 1gm every 12 hrs  Indication: CAP  Renal Function: Estimated Creatinine Clearance: 28.3 mL/min (A) (by C-G formula based on SCr of 1.81 mg/dL (H)).  Antimicrobial dosage has been changed to:  Cefepime 2gm every 24 hrs   Thank you for allowing pharmacy to be a part of this patient's care.  Carsen Machi Rodriguez-Guzman PharmD, BCPS 08/14/2021 6:54 PM

## 2021-08-14 NOTE — Consult Note (Signed)
CODE SEPSIS - PHARMACY COMMUNICATION  **Broad Spectrum Antibiotics should be administered within 1 hour of Sepsis diagnosis**  Time Code Sepsis Called/Page Received: 1642  Antibiotics Ordered: 1645  Time of 1st antibiotic administration: 1308  Additional action taken by pharmacy: N/A  If necessary, Name of Provider/Nurse Contacted: N/A  Lorna Dibble ,PharmD Clinical Pharmacist  08/14/2021  5:25 PM

## 2021-08-14 NOTE — Sepsis Progress Note (Signed)
Sepsis protocol is being followed by [facility]. 

## 2021-08-14 NOTE — Assessment & Plan Note (Addendum)
Patient w/ presenting c/o chest pain. No radiation to arm or jaw. EKG reveals sinus tachycardia, LAE, ST elevation suggestive of inferior injury. Troponins negative x 2. Chest wall tender to palpation.   Plan Address PNA, CHF  No indication of acute STEMI or need for intervention, although patient has multiple risk factors  Repeat troponins  EKG in AM

## 2021-08-14 NOTE — Assessment & Plan Note (Deleted)
Patient in A fib with RVR. Rate remains high despite diltiazem 10 mg IV.  Plan Continue home meds but start diltiazem infusion to control rate.

## 2021-08-14 NOTE — Assessment & Plan Note (Addendum)
Chronic recurring problem. Diuretics a contributing cause.  Replace as needed

## 2021-08-14 NOTE — Assessment & Plan Note (Addendum)
Creatinine around 1.05 and has been stable.  With diuresis creatinine had increased to 1.37 and diuretics held, and therefore creatinine continues to improve.

## 2021-08-14 NOTE — Assessment & Plan Note (Addendum)
Patient on 3L O2 at home. ON presentation she was mildly hypoxemic 2/2 CHF and PNA, with recurrent pleural effusions and overall decline, has required BiPAP and high flow oxygen.  She is actually had some improvement over the past few days going from 15 L down to as low as 5 L on 6/30.  In part due to noncompliance with BiPAP.  Despite dramatic improvement, overall she has such advanced severe disease she has limited life expectancy likely of 6 months at best.    Oxygenation a little worse today.  Patient is always at high risk for deterioration.  Today requiring 6 to 7 L.  Her Lasix had been stopped several days ago due to worsening renal failure

## 2021-08-14 NOTE — H&P (Signed)
History and Physical    Karen [name] ZWC:[id number] DOB: 13-Jan-1946 DOA: 08/14/2021  DOS: the patient was seen and examined on 08/14/2021  PCP: Margarita Rana, MD   Patient coming from: SNF  I have personally briefly reviewed patient's old medical records in [place] is a 76 y.o. female with a past medical history of HFpEF, anemia, CKD 4, COPD, diabetes, hypertension, hyperlipidemia recently hospitalized and d/c'd 08/10/21 to rehab.  She presents to the emergency department for left chest pain chest pain starting 2 days ago but worse today.  According to the patient she was diagnosed with pneumonia 2 to 3 days ago, since that time she has been experiencing pain in her chest as well as some mild shortness of breath.  Patient states she wears 3 L of oxygen chronically currently satting 93% on 3 L during my evaluation.  Patient states pain in the chest worse when she coughs. She denies prior MI.   ED Course: Patient presents with tachypnea, leukocytosis, SOB, reported dx of PNA at in-patient rehab.  She is noted to have AKI. Lactic acid elevated to 2.5. Code sepsis activated. Patient received 700 cc fluid bolus - stopped due to X-ray findings of CHF. She received cefepime and vanomycin.She was in a fib with RVR and received IV dose of diltiazem 10 mg. Her glucose was markedly elevated, K was low, Na was low. Troponins 16, 12. Patient was stablized. TRH called to admit for continued evaluation and treatment  Review of Systems:  Review of Systems  Constitutional:  Positive for malaise/fatigue. Negative for chills and fever.  HENT: Negative.    Eyes: Negative.   Respiratory:  Positive for shortness of breath and wheezing. Negative for cough.   Cardiovascular:  Positive for chest pain. Negative for leg swelling and PND.  Gastrointestinal:  Positive for nausea.  Genitourinary: Negative.   Musculoskeletal: Negative.   Skin: Negative.   Neurological: Negative.    Psychiatric/Behavioral: Negative.      Past Medical History:  Diagnosis Date   (HFpEF) heart failure with preserved ejection fraction ([place])    a. 2017 Echo: EF 50%; b. 06/2018 Echo: EF 50-55%; c. 08/2018 Echo: EF 50-55%, Nl RV fxn; d. 09/2019 Echo: EF 55-60%, no rwma, mild LVH, Gr1 DD, nl RV size/fxn, PASP 63.51mHg. Mildly dil LA. Triv MR. Mod AS (AoV 0.94cm^2 VTI; mean grad 17.363mg); d. 02/2021 Echo: EF 60-65%, no rwma, GrI DD, mildly red RV fxn, RVSP 64.2m9m, mild-mod MR, mod-sev TR, mild AS.   Acute on chronic respiratory failure with hypoxia and hypercapnia (HCC) 01/07/2015   Anemia    Asterixis 01/07/2015   Asthma    Cataract    CKD (chronic kidney disease), stage III (HCC)    COPD [place] disease) [place]  a. 06/2018 tobacco use, home 3L oxygen    Diabetes mellitus without complication ([place]  a. 09/2019 A1C 8.8   Edema, peripheral 04/20/2014   GI bleed 06/28/2019   History of kidney stones    Hyperlipidemia    Hypertension    Iron deficiency anemia 06/22/2014   Junctional bradycardia    a. In setting of beta blocker therapy.   Leucocytosis 10/19/2015   Moderate aortic stenosis    a.  09/2019 Echo: Mod AS (AoV 0.94cm^2 VTI; mean grad 17.3mm24m; b. 02/2021 Echo: Mild AS.   Morbid obesity (HCC)[place] Overactive bladder    Primary osteoarthritis of right knee 09/01/2016  Sciatica 01/07/2015   Valvular heart disease    a. 02/2021 Echo: EF 60-65%, no rwma, GrI DD, mild-mod MR, mod-sev TR, mild AS    Past Surgical History:  Procedure Laterality Date   APPENDECTOMY     CESAREAN SECTION     x3   CHOLECYSTECTOMY     COLONOSCOPY WITH PROPOFOL N/A 08/28/2017   Procedure: COLONOSCOPY WITH PROPOFOL;  Surgeon: Lucilla Lame, MD;  Location: [facility] ENDOSCOPY;  Service: Endoscopy;  Laterality: N/A;   COLONOSCOPY WITH PROPOFOL N/A 08/29/2017   Procedure: COLONOSCOPY WITH PROPOFOL;  Surgeon: Lucilla Lame, MD;  Location[facility] END[facility]COPY;  Service: Endoscopy;  Laterality:  N/A;   CYSTOSCOPY W/ URETERAL STENT PLACEMENT Right 09/15/2017   Procedure: CYSTOSCOPY WITH RETROGRADE PYELOGRAM/URETERAL STENT PLACEMENT;  Surgeon: Cleon Gustin, MD;  Location: [facility] ORS;  Service: Urology;  Laterality: Right;   CYSTOSCOPY/URETEROSCOPY/HOLMIUM LASER/STENT PLACEMENT Right 10/09/2017   Procedure: CYSTOSCOPY/URETEROSCOPY/HOLMIUM LASER/STENT PLACEMENT;  Surgeon: Abbie Sons, MD;  Location: [facility] ORS;  Service: Urology;  Laterality: Right;  right Stent exchange   ESOPHAGOGASTRODUODENOSCOPY (EGD) WITH PROPOFOL N/A 09/16/2018   Procedure: ESOPHAGOGASTRODUODENOSCOPY (EGD) WITH PROPOFOL;  Surgeon: Lin Landsman, MD;  Location: [place];  Service: Gastroenterology;  Laterality: N/A;   EYE SURGERY     IR THORACENTESIS ASP PLEURAL SPACE W/IMG GUIDE  02/07/2021    Soc Hx - married 40+ years, 3 children, 3 grandchildren. LIves with husband.    reports that she quit smoking about 28 years ago. Her smoking use included cigarettes. She has a 20.00 pack-year smoking history. She has never used smokeless tobacco. She reports that she does not drink alcohol and does not use drugs.  Allergies  Allergen Reactions   Ace Inhibitors Hives   Beta Adrenergic Blockers     Junctional bradycardia   Gabapentin Hives   Lisinopril Hives   Lyrica [Pregabalin] Hives   Shrimp [Shellfish Allergy] Swelling    Swelling of the lips    Family History  Problem Relation Age of Onset   Other Mother        unknown medical history   Other Father        unknown medical history    Prior to Admission medications   Medication Sig Start Date End Date Taking? Authorizing Provider  acetaminophen (TYLENOL) 500 MG tablet Take 1-2 tablets (500-1,000 mg total) by mouth every 6 (six) hours as needed for mild pain, fever, moderate pain or headache. Do not take more than 4 grams a day 02/05/21   Mercy Riding, MD  albuterol (VENTOLIN HFA) 108 (90 Base) MCG/ACT inhaler Inhale 2 puffs into the lungs every  6 (six) hours as needed for wheezing or shortness of breath.     [provider]  allopurinol (ZYLOPRIM) 100 MG tablet Take 1 tablet by mouth daily.    [provider]  apixaban (ELIQUIS) 5 MG TABS tablet Take 1 tablet by mouth twice daily 04/06/21   [name], MD  atorvastatin (LIPITOR) 10 MG tablet Take 1 tablet by mouth daily. 12/17/20   [provider]  diltiazem (CARDIZEM SR) 60 MG 12 hr capsule Take 1 capsule (60 mg total) by mouth 2 (two) times daily. 08/08/21   Annita Brod, MD  Dulaglutide 3 MG/0.5ML SOPN Inject 0.5 mLs into the skin once a week. 04/11/21   [provider]  empagliflozin (JARDIANCE) 10 MG TABS tablet Take 1 tablet (10 mg total) by mouth daily. 08/09/21   Annita Brod, MD  esomeprazole ([place]) 40  MG capsule Take 40 mg by mouth daily. 12/17/18   [provider]  Ferrous Sulfate (IRON) 325 (65 Fe) MG TABS Take 1 tablet (325 mg total) by mouth daily. 02/12/21   Loletha Grayer, MD  fluticasone (FLONASE) 50 MCG/ACT nasal spray Place 2 sprays into both nostrils daily. 11/15/18   [provider]  Fluticasone-Umeclidin-Vilant 100-62.5-25 MCG/INH AEPB Inhale 1 puff into the lungs daily. 12/25/17   [provider]  insulin glargine-yfgn (SEMGLEE) 100 UNIT/ML injection Inject 0.18 mLs (18 Units total) into the skin 2 (two) times daily. 08/08/21   Annita Brod, MD  insulin lispro (HUMALOG) 100 UNIT/ML KwikPen Inject 5 Units into the skin in the morning, at noon, and at bedtime. 10 units with meals plus additional units for correction 08/08/21   Annita Brod, MD  ipratropium (ATROVENT) 0.06 % nasal spray Place 2 sprays into both nostrils 4 (four) times daily. 05/28/20   Margarette Canada, NP  ipratropium-albuterol (DUONEB) 0.5-2.5 (3) MG/3ML SOLN Take 3 mLs by nebulization 4 (four) times daily as needed. 10/20/20   [provider]  melatonin 5 MG TABS Take 5 mg by mouth at bedtime. 11/10/20   [provider]  montelukast (SINGULAIR) 10 MG tablet Take 10 mg by mouth at bedtime.    [provider]  senna-docusate (SENOKOT-S) 8.6-50 MG tablet Take 1 tablet by mouth 2 (two) times daily between meals as needed for mild constipation. 02/05/21   Mercy Riding, MD  spironolactone (ALDACTONE) 25 MG tablet Take 0.5 tablets (12.5 mg total) by mouth daily. 08/09/21   Annita Brod, MD  torsemide 40 MG TABS Take 40 mg by mouth daily. 08/09/21   Annita Brod, MD  traZODone (DESYREL) 50 MG tablet Take 1 tablet (50 mg total) by mouth at bedtime as needed for sleep. 02/12/21   Loletha Grayer, MD  vitamin B-12 (CYANOCOBALAMIN) 500 MCG tablet Take 500 mcg by mouth daily.    [provider]  VITAMIN D, CHOLECALCIFEROL, PO Take 1 tablet by mouth daily.    [provider]    Physical Exam: Vitals:   08/14/21 1730 08/14/21 1800 08/14/21 1830 08/14/21 1900  BP: (!) 120/109 124/88 135/86 (!) 145/111  Pulse: (!) 124 (!) 117 (!) 38 (!) 55  Resp: (!) 34 (!) 35 (!) 38 (!) 29  Temp:      TempSrc:      SpO2: 93% 91% 94% 94%  Weight:        Physical Exam Vitals reviewed.  Constitutional:      General: She is not in acute distress.    Appearance: She is obese. She is ill-appearing.  HENT:     Head: Normocephalic and atraumatic.  Eyes:     Extraocular Movements: Extraocular movements intact.     Pupils: Pupils are equal, round, and reactive to light.  Neck:     Thyroid: No thyromegaly.     Vascular: No hepatojugular reflux.  Cardiovascular:     Rate and Rhythm: Tachycardia present. Rhythm irregular.     Pulses:          Carotid pulses are 0 on the right side and 0 on the left side.      Radial pulses are 2+ on the right side and 2+ on the left side.       Dorsalis pedis pulses are 1+ on the right side and 1+ on the left side.     Heart sounds: Heart sounds are distant. Murmur heard.  Pulmonary:     Effort: Tachypnea and accessory muscle usage present.      Comments: Shallow inspirations, wheezing at left base, feint rales bilaterally Chest:     Chest wall: Tenderness present. No mass.     Comments: Very tender at costo-chondral junction left sternum Abdominal:     General: Bowel sounds are normal.     Palpations: Abdomen is soft.     Comments: obese  Musculoskeletal:        General: Normal range of motion.     Cervical back: Normal range of motion and neck supple.  Skin:    General: Skin is warm and dry.  Neurological:     General: No focal deficit present.     Mental Status: She is alert and oriented to person, place, and time.  Psychiatric:        Mood and Affect: Mood is anxious.      Labs on Admission: I have personally reviewed following labs and imaging studies  CBC: Recent Labs  Lab 08/14/21 1518  WBC 25.1*  NEUTROABS 22.1*  HGB 11.0*  HCT 33.6*  MCV 86.4  PLT 580   Basic Metabolic Panel: Recent Labs  Lab 08/08/21 0620 08/14/21 1518  NA 139 119*  K 4.3 4.2  CL 90* 80*  CO2 37* 25  GLUCOSE 238* 458*  BUN 44* 69*  CREATININE 1.29* 1.81*  CALCIUM 9.1 8.9   GFR: Estimated Creatinine Clearance: 28.3 mL/min (A) (by C-G formula based on SCr of 1.81 mg/dL (H)). Liver Function Tests: Recent Labs  Lab 08/14/21 1518  AST 24  ALT 20  ALKPHOS 91  BILITOT 0.9  PROT 7.1  ALBUMIN 3.1*   No results for input(s): "LIPASE", "AMYLASE" in the last 168 hours. No results for input(s): "AMMONIA" in the last 168 hours. Coagulation Profile: No results for input(s): "INR", "PROTIME" in the last 168 hours. Cardiac Enzymes: No results for input(s): "CKTOTAL", "CKMB", "CKMBINDEX", "TROPONINI" in the last 168 hours. BNP (last 3 results) No results for input(s): "PROBNP" in the last 8760 hours. HbA1C: No results for input(s): "HGBA1C" in the last 72 hours. CBG: Recent Labs  Lab 08/09/21 2104 08/10/21 0807 08/10/21 1120 08/10/21 1613 08/14/21 1723  GLUCAP 227* 250* 254* 149* 366*   Lipid Profile: No results for  input(s): "CHOL", "HDL", "LDLCALC", "TRIG", "CHOLHDL", "LDLDIRECT" in the last 72 hours. Thyroid Function Tests: No results for input(s): "TSH", "T4TOTAL", "FREET4", "T3FREE", "THYROIDAB" in the last 72 hours. Anemia Panel: No results for input(s): "VITAMINB12", "FOLATE", "FERRITIN", "TIBC", "IRON", "RETICCTPCT" in the last 72 hours. Urine analysis:    Component Value Date/Time   COLORURINE YELLOW (A) 08/03/2021 1549   APPEARANCEUR CLEAR (A) 08/03/2021 1549   APPEARANCEUR Cloudy (A) 09/28/2017 1338   LABSPEC 1.006 08/03/2021 1549   LABSPEC 1.024 04/02/2014 0517   PHURINE 6.0 08/03/2021 1549   GLUCOSEU NEGATIVE 08/03/2021 1549   GLUCOSEU >=500 04/02/2014 0517   HGBUR SMALL (A) 08/03/2021 1549   BILIRUBINUR NEGATIVE 08/03/2021 1549   BILIRUBINUR Negative 09/28/2017 1338   BILIRUBINUR Negative 04/02/2014 0517   KETONESUR NEGATIVE 08/03/2021 1549   PROTEINUR NEGATIVE 08/03/2021 1549   NITRITE POSITIVE (A) 08/03/2021 1549   LEUKOCYTESUR TRACE (A) 08/03/2021 1549   LEUKOCYTESUR Trace 04/02/2014 0517    Radiological Exams on Admission: I have personally reviewed images DG Chest Portable 1 View  Result Date: 08/14/2021 CLINICAL DATA:  Chest pain, pneumonia EXAM: PORTABLE CHEST 1 VIEW COMPARISON:  None Available. FINDINGS: Cardiac silhouette appears increased in volume. Central  venous congestion increased. Increased LEFT lower lobe density. No pneumothorax. IMPRESSION: 1. Increase in cardiac silhouette and venous congestion suggest congestive heart failure. 2. LEFT lobe atelectasis versus pneumonia. Electronically Signed   By: Suzy Bouchard M.D.   On: 08/14/2021 16:31    EKG: I have personally reviewed EKG: sinus tachycardia LAE, ST elevation inferior leads - minor  Assessment/Plan Principal Problem:   HAP (hospital-acquired pneumonia) Active Problems:   Acute on chronic heart failure with preserved ejection fraction (HFpEF) (HCC)   Hyponatremia   Sepsis due to pneumonia (HCC)    Chronic respiratory failure with hypoxia (HCC)   Hypokalemia   Uncontrolled type 2 diabetes mellitus with hyperglycemia, with long-term current use of insulin (HCC)   AF (paroxysmal atrial fibrillation) (HCC)   Acute kidney injury superimposed on CKD (HCC)   OSA (obstructive sleep apnea)   COPD (chronic obstructive pulmonary disease) (HCC)   Chest pain   Gastroesophageal reflux disease without esophagitis    Assessment and Plan: * HAP (hospital-acquired pneumonia) Patient with recent hospitalization with d/c 08/10/21 to in-patient rehab. She was diagnosed with PNA by her report two days ago. Records of Abx treatment not available. On presentation she met sepsis criteria. She had tachypnea, leukocytosis, infiltrate left lobe on CXR. She was started on increased oxygen by mask. She was administered cefepime and vancomycin.  Plan Admit to PCU  Procalcitonin ordered  Continue Cefepime and Vancomycin  O2 to keep O2 sat > 90%  Sepsis due to pneumonia [facility]) Patient presented with shortness of breath, AKI,  Left lobe PNA on CXR, marked leukocytosis and tachypnea to 34. Code sepsis activated by EDP. Patient received 700 cc NS bolus that was stopped when xx-ray results revealed CHF. Blood cultures were drawn. Patient received cefepime and vancomycin in ED.  Hyponatremia Chronic recurring problem. Na 119 but corrected to 124 taking elevated glucose into account.  Plan IV NS at 50 cc/hr  F/u Bmet in AM  Acute on chronic heart failure with preserved ejection fraction (HFpEF) ([place]) Last Echo 08/04/21 revealing grade III DD, tricuspid regurg. She now presents with pulmonary edema, increased SOB.  Plan Continue discharge medications  Change po diuretics to IV lasix  40 mg q6 x 4 doses  F/u CXR  Acute kidney injury superimposed on CKD ([place]) Patient's last Cr 1.01 08/06/21 now 1.81 2/2 multiple co-morbidities and sepsis.  Plan Gentle hydration  Control of sepsis, PNA, CHF  F/u Bmet  AF (paroxysmal  atrial fibrillation) ([place]) Patient in A fib with RVR. Rate remains high despite diltiazem 10 mg IV.  Plan Continue home meds but start diltiazem infusion to control rate.  Uncontrolled type 2 diabetes mellitus with hyperglycemia, with long-term current use of insulin ([place]) Patient with very high glucose. She is on basal insulin, dapaglifozin.  Plan Continue home meds  Ss coverage  Hypokalemia Chronic recurring problem. Diuretics a contributing cause.  Plan K 10 meq IV x 2  Bmet in AM  Chronic respiratory failure with hypoxia ([place]) Patient on 3L O2 at home. ON presentation she was mildly hypoxemic 2/2 CHF and PNA  Plan Continue 5L face mask O2 and wean down as problems correct.  Continue home pulmonary meds  Resp therapy for CPAP in patient with OSA  OSA (obstructive sleep apnea) Will use CPAP  Gastroesophageal reflux disease without esophagitis Continue PPI tx  Chest pain Patient w/ presenting c/o chest pain. No radiation to arm or jaw. EKG reveals sinus tachycardia, LAE, ST elevation suggestive of inferior injury. Troponins  negative x 2. Chest wall tender to palpation.  Plan Address PNA, CHF  No indication of acute STEMI or need for intervention, although patient has multiple risk factors  Repeat troponins  EKG in AM  COPD (chronic obstructive pulmonary disease) (HCC) Continue home regimen       DVT prophylaxis: Eliquis Code Status: DNR/DNI(Do NOT Intubate) Family Communication: spouse was on the phone - fully informed  Disposition Plan: TBD  Consults called: none  Admission status: Inpatient, Telemetry bed   Adella Hare, MD [facility] 08/14/2021, 7:25 PM

## 2021-08-14 NOTE — Consult Note (Signed)
PHARMACY -  BRIEF ANTIBIOTIC NOTE   Pharmacy has received consult(s) for Vancomycin & cefepime from an ED provider.  The patient's profile has been reviewed for ht/wt/allergies/indication/available labs.    One time order(s) placed for: Vancomycin 1g IV x1 in ED Cefepime 2g IV x1 in ED  Further antibiotics/pharmacy consults should be ordered by admitting physician if indicated.                       Thank you, Lorna Dibble 08/14/2021  5:24 PM

## 2021-08-14 NOTE — Assessment & Plan Note (Signed)
Will use CPAP

## 2021-08-14 NOTE — Assessment & Plan Note (Signed)
Continue PPI tx

## 2021-08-14 NOTE — Assessment & Plan Note (Addendum)
Resolved.  Met criteria for sepsis on admission given tachypnea, tachycardia and pulmonary source.  Sepsis stabilized and completed antibiotic course.

## 2021-08-14 NOTE — ED Provider Notes (Signed)
[facility] Provider Note    Event Date/Time   First MD Initiated Contact with Patient 08/14/21 1507     (approximate)  History   Chief Complaint: Chest Pain  HPI  Karen [name] is a 76 y.o. female with a past medical history of CHF, anemia, CKD, COPD, diabetes, hypertension, hyperlipidemia, presents to the emergency department for chest pain.  According to the patient she was diagnosed with pneumonia 2 to 3 days ago, since that time she has been experiencing pain in her chest as well as some mild shortness of breath.  Patient states she wears 3 L of oxygen chronically currently satting 93% on 3 L during my evaluation.  Patient states pain in the chest worse when she coughs.  No known fever, denies vomiting.  No abdominal pain.   Physical Exam   Triage Vital Signs: ED Triage Vitals  Enc Vitals Group     BP 08/14/21 1450 140/89     Pulse Rate 08/14/21 1450 (!) 102     Resp 08/14/21 1450 (!) 30     Temp 08/14/21 1456 98 F (36.7 C)     Temp Source 08/14/21 1456 Oral     SpO2 08/14/21 1449 92 %     Weight 08/14/21 1452 206 lb 2.1 oz (93.5 kg)     Height --      Head Circumference --      Peak Flow --      Pain Score 08/14/21 1452 9     Pain Loc --      Pain Edu? --      Excl. in [place]? --     Most recent vital signs: Vitals:   08/14/21 1456 08/14/21 1500  BP:  130/64  Pulse:  100  Resp:  (!) 26  Temp: 98 F (36.7 C)   SpO2:  90%    General: Awake, no distress.  CV:  Good peripheral perfusion.  Regular rate and rhythm  Resp:  Normal effort.  Equal breath sounds bilaterally.  Abd:  No distention.  Soft, nontender.  No rebound or guarding.    ED Results / Procedures / Treatments   EKG  EKG viewed and interpreted by myself shows sinus tachycardia at 102 bpm with a narrow QRS, normal axis, largely normal intervals nonspecific ST changes but no ST elevation.  RADIOLOGY  I have interpreted the chest x-ray images appears to have pulmonary  edema on my evaluation. Radiology has read the chest x-ray as enlarged cardiac silhouette with venous congesting suggesting CHF.  Left atelectasis versus pneumonia  MEDICATIONS ORDERED IN ED: Medications - No data to display   IMPRESSION / MDM / [place] / ED COURSE  I reviewed the triage vital signs and the nursing notes.  Patient's presentation is most consistent with acute presentation with potential threat to life or bodily function.  Patient presents emergency department for chest pain ongoing over the past 2 to 3 days since she was diagnosed with pneumonia at her nursing facility.  Patient currently wearing 3 L of oxygen satting 92 to 93% which is her baseline O2 requirement.  Patient states pain in the center of her chest worse when she coughs.  Denies any known fever.  Patient is mildly tachypneic in the emergency department in the mid 20s, pulse rate around 100.  We will check labs including cardiac enzymes, BNP obtain a chest x-ray and continue to closely monitor.  Patient agreeable to plan of care.  Patient's  labs have resulted showing a sodium of 119 and a blood glucose of 458.  Creatinine also elevated 1.8.  Patient has a white blood cell count of 25,000 she is borderline hypoxic now currently 90% on 3 L.  Respiratory rate of 26.  We will start the patient on normal saline including a saline infusion.  I reviewed the patient's chest x-ray she appears to have pulmonary edema on her chest x-ray we will slow down the bolus to a very slow infusion.  Reassuringly troponin is normal.  Patient does have a moderate leukocytosis 25,000 I reviewed the patient's discharge summary from 08/08/2021 she does not appear to have been placed on the steroid.  Patient's chemistry shows sodium of 119 but a blood glucose of 458, corrected to 125.  Mild renal insufficiency.  Patient is now tachycardic around 120 bpm appears to be atrial fibrillation on reviewing of the telemetry monitor.  We will  dose 10 mg of IV diltiazem.  Given the patient's significant leukocytosis with chest x-ray findings we will start the patient on broad-spectrum antibiotics.  We will send blood cultures.  Patient will be admitted to the hospitalist service for ongoing management.  CRITICAL CARE Performed by: Harvest Dark   Total critical care time: 30 minutes  Critical care time was exclusive of separately billable procedures and treating other patients.  Critical care was necessary to treat or prevent imminent or life-threatening deterioration.  Critical care was time spent personally by me on the following activities: development of treatment plan with patient and/or surrogate as well as nursing, discussions with consultants, evaluation of patient's response to treatment, examination of patient, obtaining history from patient or surrogate, ordering and performing treatments and interventions, ordering and review of laboratory studies, ordering and review of radiographic studies, pulse oximetry and re-evaluation of patient's condition.   FINAL CLINICAL IMPRESSION(S) / ED DIAGNOSES   Chest pain Hyponatremia CHF Pneumonia  Note:  This document was prepared using Dragon voice recognition software and may include unintentional dictation errors.   Harvest Dark, MD 08/14/21 [phone]

## 2021-08-15 ENCOUNTER — Inpatient Hospital Stay: Payer: Medicare Other

## 2021-08-15 DIAGNOSIS — J188 Other pneumonia, unspecified organism: Secondary | ICD-10-CM | POA: Diagnosis not present

## 2021-08-15 DIAGNOSIS — I4891 Unspecified atrial fibrillation: Secondary | ICD-10-CM | POA: Diagnosis not present

## 2021-08-15 DIAGNOSIS — Y95 Nosocomial condition: Secondary | ICD-10-CM | POA: Diagnosis not present

## 2021-08-15 DIAGNOSIS — J189 Pneumonia, unspecified organism: Secondary | ICD-10-CM | POA: Diagnosis not present

## 2021-08-15 DIAGNOSIS — I5033 Acute on chronic diastolic (congestive) heart failure: Secondary | ICD-10-CM | POA: Diagnosis not present

## 2021-08-15 LAB — GLUCOSE, CAPILLARY
Glucose-Capillary: 381 mg/dL — ABNORMAL HIGH (ref 70–99)
Glucose-Capillary: 461 mg/dL — ABNORMAL HIGH (ref 70–99)
Glucose-Capillary: 242 mg/dL — ABNORMAL HIGH (ref 70–99)
Glucose-Capillary: 217 mg/dL — ABNORMAL HIGH (ref 70–99)

## 2021-08-15 LAB — CBC
HCT: 32.1 % — ABNORMAL LOW (ref 36.0–46.0)
Hemoglobin: 10.7 g/dL — ABNORMAL LOW (ref 12.0–15.0)
MCH: 28.4 pg (ref 26.0–34.0)
MCHC: 33.3 g/dL (ref 30.0–36.0)
MCV: 85.1 fL (ref 80.0–100.0)
Platelets: 291 10*3/uL (ref 150–400)
RBC: 3.77 MIL/uL — ABNORMAL LOW (ref 3.87–5.11)
RDW: 14.5 % (ref 11.5–15.5)
WBC: 23.6 10*3/uL — ABNORMAL HIGH (ref 4.0–10.5)
nRBC: 0 % (ref 0.0–0.2)

## 2021-08-15 LAB — BASIC METABOLIC PANEL
Anion gap: 12 (ref 5–15)
BUN: 58 mg/dL — ABNORMAL HIGH (ref 8–23)
CO2: 27 mmol/L (ref 22–32)
Calcium: 8.9 mg/dL (ref 8.9–10.3)
Chloride: 85 mmol/L — ABNORMAL LOW (ref 98–111)
Creatinine, Ser: 1.15 mg/dL — ABNORMAL HIGH (ref 0.44–1.00)
GFR, Estimated: 49 mL/min — ABNORMAL LOW (ref >60)
Glucose, Bld: 347 mg/dL — ABNORMAL HIGH (ref 70–99)
Potassium: 4.2 mmol/L (ref 3.5–5.1)
Sodium: 124 mmol/L — ABNORMAL LOW (ref 135–145)

## 2021-08-15 LAB — GLUCOSE, RANDOM: Glucose, Bld: 401 mg/dL — ABNORMAL HIGH (ref 70–99)

## 2021-08-15 LAB — MRSA NEXT GEN BY PCR, NASAL: MRSA by PCR Next Gen: NOT DETECTED

## 2021-08-15 LAB — HIV ANTIBODY (ROUTINE TESTING W REFLEX): HIV Screen 4th Generation wRfx: NONREACTIVE

## 2021-08-15 MED ORDER — POTASSIUM CHLORIDE 10 MEQ/100ML IV SOLN
10.0000 meq | INTRAVENOUS | Status: AC
  Administered 2021-08-15 (×2): 10 meq via INTRAVENOUS
  Filled 2021-08-15 (×2): qty 100

## 2021-08-15 MED ORDER — OXYCODONE HCL 5 MG PO TABS
5.0000 mg | ORAL_TABLET | Freq: Four times a day (QID) | ORAL | Status: DC | PRN
  Administered 2021-08-16: 5 mg via ORAL
  Filled 2021-08-15: qty 1

## 2021-08-15 MED ORDER — FUROSEMIDE 10 MG/ML IJ SOLN
40.0000 mg | Freq: Two times a day (BID) | INTRAMUSCULAR | Status: DC
  Administered 2021-08-15 – 2021-08-19 (×9): 40 mg via INTRAVENOUS
  Filled 2021-08-15 (×10): qty 4

## 2021-08-15 MED ORDER — VANCOMYCIN HCL 750 MG/150ML IV SOLN
750.0000 mg | INTRAVENOUS | Status: DC
  Administered 2021-08-15: 750 mg via INTRAVENOUS
  Filled 2021-08-15 (×3): qty 150

## 2021-08-15 MED ORDER — INSULIN GLARGINE-YFGN 100 UNIT/ML ~~LOC~~ SOLN
25.0000 [IU] | Freq: Two times a day (BID) | SUBCUTANEOUS | Status: DC
Start: 2021-08-15 — End: 2021-08-25
  Administered 2021-08-15 – 2021-08-25 (×19): 25 [IU] via SUBCUTANEOUS
  Filled 2021-08-15 (×21): qty 0.25

## 2021-08-15 MED ORDER — INSULIN ASPART 100 UNIT/ML IJ SOLN
10.0000 [IU] | Freq: Three times a day (TID) | INTRAMUSCULAR | Status: DC
Start: 2021-08-15 — End: 2021-09-02
  Administered 2021-08-15 – 2021-09-02 (×44): 10 [IU] via SUBCUTANEOUS
  Filled 2021-08-15 (×43): qty 1

## 2021-08-15 MED ORDER — SODIUM CHLORIDE 0.9 % IV SOLN
2.0000 g | Freq: Two times a day (BID) | INTRAVENOUS | Status: AC
  Administered 2021-08-15 – 2021-08-20 (×12): 2 g via INTRAVENOUS
  Filled 2021-08-15: qty 12.5
  Filled 2021-08-15: qty 2
  Filled 2021-08-15 (×6): qty 12.5
  Filled 2021-08-15: qty 2
  Filled 2021-08-15 (×3): qty 12.5

## 2021-08-15 MED ORDER — DILTIAZEM HCL ER 60 MG PO CP12
60.0000 mg | ORAL_CAPSULE | Freq: Two times a day (BID) | ORAL | Status: DC
  Administered 2021-08-15 – 2021-08-19 (×8): 60 mg via ORAL
  Filled 2021-08-15 (×8): qty 1

## 2021-08-15 NOTE — Consult Note (Signed)
Cardiology Consultation:   Patient ID: Karen [name] MRN: [id number]; DOB: [birth year]  Admit date: 08/14/2021 Date of Consult: 08/15/2021  PCP:  Margarita Rana, MD   [facility] Providers Cardiologist:  Ida Rogue, MD        Patient Profile:   Karen [name] is a 76 y.o. 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 Tawanna Solo.  History of Present Illness:   Karen [name] is followed by Dr. Rockey Situ the last time she was seen in the outpatient clinic was 05/13/2021.  She had previously been in the hospital in 02/2021 for CHF exacerbation with right pleural effusion status post thoracentesis on 02/07/2021 with 600 mL of fluid that was removed.  Echocardiogram from 02/2021 revealed EF of 60 to 65% with grade 1 diastolic dysfunction, right ventricular systolic function was mildly reduced, severely elevated pulmonary artery systolic pressure, estimated right ventricular systolic pressure at 74.0 mmHg, mild to moderate mitral valve regurgitation with no evidence of mitral stenosis, and moderate to severe tricuspid regurgitation.  So she arrived to the emergency department with complaints of shortness of breath and left-sided chest pain starting 2 days prior but it had worsened today to the point that she was seeking evaluation.  According to the patient she was diagnosed with pneumonia 2 to 3 days ago and since that time has been experienced the pain in her chest as well as the shortness of breath that is gradually worsened.  At her baseline she wears 3 L of oxygen and sats 92 to 93%.  In the emergency department she was tachypneic with a  leukocytosis, shortness of breath, reported diagnosis of pneumonia in an inpatient rehab.  She was also noted to have acute kidney injury.  Lactic acid was elevated at 2.5.  Code sepsis was activated, at that point time the patient received 700 cc fluid bolus that had to be stopped due to chest x-ray findings of congestive heart failure.  She did receive cefepime and vancomycin she had a history of A-fib RVR and received an IV dose of diltiazem 10 mg.  She was hypokalemic as well as hyponatremic but she had negative high-sensitivity troponins.  Initial vital signs: Blood pressure 140/89, pulse 102, respiratory rate 30, temperature 98, SPO2 92%  Pertinent labs: Sodium 119, glucose 458, creatinine 1.8, WBCs 25, BNP 85, Hs trop 12  Imaging: Portable chest reviewed increasing cardiac silhouette with venous congestion suggestive of congestive heart failure, repeat chest film done on 08/14/2021 revealed stable cardiomegaly with pulmonary vascular congestion persistent left retrocardiac atelectasis versus pneumonia as she does have persistent opacification  Past Medical History:  Diagnosis Date   (HFpEF) heart failure with preserved ejection fraction ([place])    a. 2017 Echo: EF 50%; b. 06/2018 Echo: EF 50-55%; c. 08/2018 Echo: EF 50-55%, Nl RV fxn; d. 09/2019 Echo: EF 55-60%, no rwma, mild LVH, Gr1 DD, nl RV size/fxn, PASP 63.66mmHg. Mildly dil LA. Triv MR. Mod AS (AoV 0.94cm^2 VTI; mean grad 17.104mmHg); d. 02/2021 Echo: EF 60-65%, no rwma, GrI DD, mildly red RV fxn, RVSP 64.48mmHg, mild-mod MR, mod-sev TR, mild AS.   Acute on chronic respiratory failure with hypoxia and hypercapnia (HCC) 01/07/2015   Anemia    Asterixis  01/07/2015   Asthma    Cataract    CKD (chronic kidney disease), stage III (HCC)    COPD (chronic obstructive pulmonary disease) ([place])    a. 06/2018 tobacco use, home 3L oxygen    Diabetes mellitus without complication ([place])    a. 09/2019 A1C 8.8   Edema, peripheral 04/20/2014   GI bleed  06/28/2019   History of kidney stones    Hyperlipidemia    Hypertension    Iron deficiency anemia 06/22/2014   Junctional bradycardia    a. In setting of beta blocker therapy.   Leucocytosis 10/19/2015   Moderate aortic stenosis    a.  09/2019 Echo: Mod AS (AoV 0.94cm^2 VTI; mean grad 17.7mmHg); b. 02/2021 Echo: Mild AS.   Morbid obesity ([place])    Overactive bladder    Primary osteoarthritis of right knee 09/01/2016   Sciatica 01/07/2015   Valvular heart disease    a. 02/2021 Echo: EF 60-65%, no rwma, GrI DD, mild-mod MR, mod-sev TR, mild AS    Past Surgical History:  Procedure Laterality Date   APPENDECTOMY     CESAREAN SECTION     x3   CHOLECYSTECTOMY     COLONOSCOPY WITH PROPOFOL N/A 08/28/2017   Procedure: COLONOSCOPY WITH PROPOFOL;  Surgeon: Lucilla Lame, MD;  Location: [facility] ENDOSCOPY;  Service: Endoscopy;  Laterality: N/A;   COLONOSCOPY WITH PROPOFOL N/A 08/29/2017   Procedure: COLONOSCOPY WITH PROPOFOL;  Surgeon: Lucilla Lame, MD;  Location: [facility] ENDOSCOPY;  Service: Endoscopy;  Laterality: N/A;   CYSTOSCOPY W/ URETERAL STENT PLACEMENT Right 09/15/2017   Procedure: CYSTOSCOPY WITH RETROGRADE PYELOGRAM/URETERAL STENT PLACEMENT;  Surgeon: Cleon Gustin, MD;  Location: [facility] ORS;  Service: Urology;  Laterality: Right;   CYSTOSCOPY/URETEROSCOPY/HOLMIUM LASER/STENT PLACEMENT Right 10/09/2017   Procedure: CYSTOSCOPY/URETEROSCOPY/HOLMIUM LASER/STENT PLACEMENT;  Surgeon: Abbie Sons, MD;  Location: [facility] ORS;  Service: Urology;  Laterality: Right;  right Stent exchange   ESOPHAGOGASTRODUODENOSCOPY (EGD) WITH PROPOFOL N/A 09/16/2018   Procedure: ESOPHAGOGASTRODUODENOSCOPY (EGD) WITH PROPOFOL;  Surgeon: Lin Landsman, MD;  Location: [place];  Service: Gastroenterology;  Laterality: N/A;   EYE SURGERY     IR THORACENTESIS ASP PLEURAL SPACE W/IMG GUIDE  02/07/2021     Home Medications:  Prior to Admission medications   Medication Sig Start Date End Date Taking?  Authorizing Provider  acetaminophen (TYLENOL) 500 MG tablet Take 1-2 tablets (500-1,000 mg total) by mouth every 6 (six) hours as needed for mild pain, fever, moderate pain or headache. Do not take more than 4 grams a day 02/05/21  Yes Gonfa, Taye T, MD  albuterol (VENTOLIN HFA) 108 (90 Base) MCG/ACT inhaler Inhale 2 puffs into the lungs every 6 (six) hours as needed for wheezing or shortness of breath.     [provider]  allopurinol (ZYLOPRIM) 100 MG tablet Take 1 tablet by mouth daily.    [provider]  apixaban (ELIQUIS) 5 MG TABS tablet Take 1 tablet by mouth twice daily 04/06/21   [name], MD  atorvastatin (LIPITOR) 10 MG tablet Take 1 tablet by mouth daily. 12/17/20   [provider]  diltiazem (CARDIZEM SR) 60 MG 12 hr capsule Take 1 capsule (60 mg total) by mouth 2 (two) times daily. 08/08/21   Annita Brod, MD  Dulaglutide 3 MG/0.5ML SOPN Inject 0.5 mLs into the skin once a week. 04/11/21   [provider]  empagliflozin (JARDIANCE) 10 MG TABS tablet Take 1 tablet (10 mg total) by mouth daily. 08/09/21   Gevena Barre  K, MD  esomeprazole (NEXIUM) 40 MG capsule Take 40 mg by mouth daily. 12/17/18   [provider]  Ferrous Sulfate (IRON) 325 (65 Fe) MG TABS Take 1 tablet (325 mg total) by mouth daily. 02/12/21   Loletha Grayer, MD  fluticasone (FLONASE) 50 MCG/ACT nasal spray Place 2 sprays into both nostrils daily. 11/15/18   [provider]  Fluticasone-Umeclidin-Vilant 100-62.5-25 MCG/INH AEPB Inhale 1 puff into the lungs daily. 12/25/17   [provider]  insulin glargine-yfgn (SEMGLEE) 100 UNIT/ML injection Inject 0.18 mLs (18 Units total) into the skin 2 (two) times daily. 08/08/21   Annita Brod, MD  insulin lispro (HUMALOG) 100 UNIT/ML KwikPen Inject 5 Units into the skin in the morning, at noon, and at bedtime. 10 units with meals plus additional units for correction 08/08/21   Annita Brod, MD   ipratropium (ATROVENT) 0.06 % nasal spray Place 2 sprays into both nostrils 4 (four) times daily. 05/28/20   Margarette Canada, NP  ipratropium-albuterol (DUONEB) 0.5-2.5 (3) MG/3ML SOLN Take 3 mLs by nebulization 4 (four) times daily as needed. 10/20/20   [provider]  melatonin 5 MG TABS Take 5 mg by mouth at bedtime. 11/10/20   [provider]  montelukast (SINGULAIR) 10 MG tablet Take 10 mg by mouth at bedtime.    [provider]  senna-docusate (SENOKOT-S) 8.6-50 MG tablet Take 1 tablet by mouth 2 (two) times daily between meals as needed for mild constipation. 02/05/21   Mercy Riding, MD  spironolactone (ALDACTONE) 25 MG tablet Take 0.5 tablets (12.5 mg total) by mouth daily. 08/09/21   Annita Brod, MD  torsemide 40 MG TABS Take 40 mg by mouth daily. 08/09/21   Annita Brod, MD  traZODone (DESYREL) 50 MG tablet Take 1 tablet (50 mg total) by mouth at bedtime as needed for sleep. 02/12/21   Loletha Grayer, MD  vitamin B-12 (CYANOCOBALAMIN) 500 MCG tablet Take 500 mcg by mouth daily.    [provider]  VITAMIN D, CHOLECALCIFEROL, PO Take 1 tablet by mouth daily.    [provider]    Inpatient Medications: Scheduled Meds:  allopurinol  100 mg Oral Daily   apixaban  5 mg Oral BID   atorvastatin  10 mg Oral Daily   budesonide (PULMICORT) nebulizer solution  0.25 mg Nebulization BID   empagliflozin  10 mg Oral Daily   ferrous sulfate  325 mg Oral Q breakfast   fluticasone  2 spray Each Nare Daily   furosemide  40 mg Intravenous Q6H   insulin aspart  0-20 Units Subcutaneous TID WC   insulin aspart  10 Units Subcutaneous TID WC   insulin glargine-yfgn  25 Units Subcutaneous BID   ipratropium  2 spray Each Nare QID   lidocaine  1 patch Transdermal Q24H   melatonin  5 mg Oral QHS   montelukast  10 mg Oral QHS   pantoprazole  40 mg Oral Daily   spironolactone  12.5 mg Oral Daily   umeclidinium-vilanterol  1 puff Inhalation Daily    vitamin B-12  500 mcg Oral Daily   Continuous Infusions:  ceFEPime (MAXIPIME) IV     diltiazem (CARDIZEM) infusion 7.5 mg/hr (08/15/21 0008)   vancomycin     PRN Meds: acetaminophen, albuterol, ipratropium-albuterol, senna-docusate, traZODone  Allergies:    Allergies  Allergen Reactions   Ace Inhibitors Hives   Beta Adrenergic Blockers     Junctional bradycardia   Gabapentin Hives   Lisinopril Hives  Lyrica [Pregabalin] Hives   Shrimp [Shellfish Allergy] Swelling    Swelling of the lips    Social History:   Social History   Socioeconomic History   Marital status: Married    Spouse name: Not on file   Number of children: Not on file   Years of education: Not on file   Highest education level: Not on file  Occupational History   Not on file  Tobacco Use   Smoking status: Former    Packs/day: 1.00    Years: 20.00    Total pack years: 20.00    Types: Cigarettes    Quit date: 12/04/1992    Years since quitting: 28.7   Smokeless tobacco: Never  Vaping Use   Vaping Use: Never used  Substance and Sexual Activity   Alcohol use: No   Drug use: No   Sexual activity: Not Currently  Other Topics Concern   Not on file  Social History Narrative   Not on file   Social Determinants of Health   Financial Resource Strain: Not on file  Food Insecurity: Not on file  Transportation Needs: Not on file  Physical Activity: Not on file  Stress: Not on file  Social Connections: Not on file  Intimate Partner Violence: Not on file    Family History:    Family History  Problem Relation Age of Onset   Other Mother        unknown medical history   Other Father        unknown medical history     ROS:  Please see the history of present illness.  Review of Systems  Constitutional:  Positive for fever and malaise/fatigue.  HENT: Negative.    Eyes: Negative.   Respiratory:  Positive for cough and shortness of breath.   Cardiovascular:  Positive for chest pain.   Gastrointestinal: Negative.   Genitourinary: Negative.   Musculoskeletal: Negative.   Skin: Negative.   Neurological:  Positive for weakness.  Endo/Heme/Allergies: Negative.   Psychiatric/Behavioral: Negative.      All other ROS reviewed and negative.     Physical Exam/Data:   Vitals:   08/15/21 0818 08/15/21 0928 08/15/21 0952 08/15/21 1229  BP: 136/78   121/88  Pulse: 96   90  Resp: (!) 26   (!) 30  Temp: (!) 97.4 F (36.3 C)   98 F (36.7 C)  TempSrc: Oral   Axillary  SpO2: 91% 92% 94% 97%  Weight:        Intake/Output Summary (Last 24 hours) at 08/15/2021 1341 Last data filed at 08/15/2021 1230 Gross per 24 hour  Intake 1130 ml  Output 1975 ml  Net -845 ml      08/14/2021    2:52 PM 08/10/2021    5:15 AM 08/09/2021    5:16 AM  Last 3 Weights  Weight (lbs) 206 lb 2.1 oz 199 lb 8.3 oz 201 lb 11.5 oz  Weight (kg) 93.5 kg 90.5 kg 91.5 kg     Body mass index is 36.51 kg/m.  General:  Well nourished, well developed, in no acute distress, resting in bed with Bipap mask on HEENT: normal Neck: no JVD appreciated  Vascular: No carotid bruits; Distal pulses 2+ bilaterally Cardiac:  normal S1, S2; irregularly irregular ; no murmur  Lungs:  coarse with diminished on the left to auscultation bilaterally no wheezing, rhonchi or rales, respirations are unlabored at rest with bipap on Abd: soft, nontender, no hepatomegaly, obese, bowel sounds present in all  4 quadrants Ext: no edema Musculoskeletal:  No deformities, BUE and BLE strength normal and equal Skin: warm and dry  Neuro:  CNs 2-12 intact, no focal abnormalities noted Psych:  Normal affect   EKG:  The EKG was personally reviewed and demonstrates:  ST with LVH, ST depression with T wave inversion in lateral leads Telemetry:  Telemetry was personally reviewed and demonstrates:  Atrial fibrillation with artifact rate controlled 90-110  Relevant CV Studies: Echocardiogram completed on 08/04/2021 1. Left ventricular  ejection fraction, by estimation, is 55 to 60%. The  left ventricle has normal function. The left ventricle has no regional  wall motion abnormalities. Left ventricular diastolic parameters are  consistent with Grade III diastolic  dysfunction (restrictive). Elevated left atrial pressure. There is the  interventricular septum is flattened in systole, consistent with right  ventricular pressure overload.   2. Right ventricular systolic function is mildly reduced. The right  ventricular size is moderately enlarged. There is moderately elevated  pulmonary artery systolic pressure.   3. Left atrial size was mildly dilated.   4. The mitral valve is degenerative. Mild mitral valve regurgitation. No  evidence of mitral stenosis.   5. Tricuspid valve regurgitation is moderate.   6. The aortic valve has an indeterminant number of cusps. There is mild  calcification of the aortic valve. There is moderate thickening of the  aortic valve. Aortic valve regurgitation is not visualized. Mild to  moderate aortic valve stenosis. Aortic  valve area, by VTI measures 1.20 cm. Aortic valve mean gradient measures  10.3 mmHg.   7. The inferior vena cava is dilated in size with <50% respiratory  variability, suggesting right atrial pressure of 15 mmHg.   Laboratory Data:  High Sensitivity Troponin:   Recent Labs  Lab 08/03/21 1324 08/03/21 1549 08/14/21 1518 08/14/21 1653  TROPONINIHS 11 10 16 12      Chemistry Recent Labs  Lab 08/14/21 1518 08/15/21 0558 08/15/21 1231  NA 119* 124*  --   K 4.2 4.2  --   CL 80* 85*  --   CO2 25 27  --   GLUCOSE 458* 347* 401*  BUN 69* 58*  --   CREATININE 1.81* 1.15*  --   CALCIUM 8.9 8.9  --   GFRNONAA 28* 49*  --   ANIONGAP 14 12  --     Recent Labs  Lab 08/14/21 1518  PROT 7.1  ALBUMIN 3.1*  AST 24  ALT 20  ALKPHOS 91  BILITOT 0.9   Lipids No results for input(s): "CHOL", "TRIG", "HDL", "LABVLDL", "LDLCALC", "CHOLHDL" in the last 168 hours.   Hematology Recent Labs  Lab 08/14/21 1518 08/15/21 0558  WBC 25.1* 23.6*  RBC 3.89 3.77*  HGB 11.0* 10.7*  HCT 33.6* 32.1*  MCV 86.4 85.1  MCH 28.3 28.4  MCHC 32.7 33.3  RDW 14.6 14.5  PLT 346 291   Thyroid No results for input(s): "TSH", "FREET4" in the last 168 hours.  BNP Recent Labs  Lab 08/14/21 1530  BNP 85.0    DDimer No results for input(s): "DDIMER" in the last 168 hours.   Radiology/Studies:  DG Chest Port 1 View  Result Date: 08/15/2021 CLINICAL DATA:  Dyspnea. EXAM: PORTABLE CHEST 1 VIEW COMPARISON:  08/14/2021 FINDINGS: Stable marked cardiomegaly. Stable pulmonary vascular congestion. Persistent opacification of left retrocardiac lung base is seen which may be due to atelectasis or pneumonia. IMPRESSION: Stable cardiomegaly and pulmonary vascular congestion. Persistent left retrocardiac atelectasis versus pneumonia. Electronically Signed  By: Marlaine Hind M.D.   On: 08/15/2021 12:57   DG Chest Portable 1 View  Result Date: 08/14/2021 CLINICAL DATA:  Chest pain, pneumonia EXAM: PORTABLE CHEST 1 VIEW COMPARISON:  None Available. FINDINGS: Cardiac silhouette appears increased in volume. Central venous congestion increased. Increased LEFT lower lobe density. No pneumothorax. IMPRESSION: 1. Increase in cardiac silhouette and venous congestion suggest congestive heart failure. 2. LEFT lobe atelectasis versus pneumonia. Electronically Signed   By: Suzy Bouchard M.D.   On: 08/14/2021 16:31     Assessment and Plan:   Sepsis secondary to hospital acquired pneumonia with hypoxic respiratory failure  -recently diagnosed with PNA at rehab facility -CXR reveals left lower lobe infiltrate -continue to support respiratory status currently on bipap -titrate FiO2 to keep O2 sats greater than equal to 90%, patient baseline of 3L O2 via [place] -antibiotic therapy per primary team -lactic acid 2.2 -WBC 23.6 -supportive care  2.HFpEF -BNP 85, not in acute  exacerbation -recent echocardiogram revealed EF 55-60%, G3DD -continue lasix with changing to bid  -transition back to orals in the next 1-2 days -continue spironolactone 12.5 mg daily -daily weight, strict I & O, low sodium diet  3. Paroxysmal atrial fibrillation, was in RVR -continue eliquis -continue diltiazem drip  -intolerant of beta blockers  -restart home diltiazem 60 mg bid -continue cardiac monitor -higher heart rate driven by breathing, likely once breathing has improved so will heart rate  4. Hypokalemia- resolved -potassium level 4.2 -daily bmp -recommend keeping levels closer to 4 -monitor/trend/replete as needed  5. Hyperlipidemia -continue atorvastatin 10 mg daily -LDL 29  6.AKI likely multifactorial to co-morbidities and sepsis -creatinine 1.15 -baseline 1.01 -daily bmp -monitor and tend with diuretics and antibiotics    Risk Assessment/Risk Scores:        New York Heart Association (NYHA) Functional Class NYHA Class III  CHA2DS2-VASc Score = 6   This indicates a 9.7% annual risk of stroke. The patient's score is based upon: CHF History: 1 HTN History: 1 Diabetes History: 1 Stroke History: 0 Vascular Disease History: 0 Age Score: 2 Gender Score: 1         For questions or updates, please contact [place] Please consult [email or web address] for contact info under    Signed, Ryeleigh Santore, NP  08/15/2021 1:41 PM

## 2021-08-15 NOTE — Progress Notes (Signed)
Pharmacy Antibiotic Note  Karen [name] is a 76 y.o. female admitted on 08/14/2021 with pneumonia.  Pharmacy has been consulted for Vancomycin dosing. Patient did receive Vancomycin 1g in the ED on 6/11. She was also started on Cefepime that was renally adjusted to 2g q24h.  Plan: Vancomycin 750 mg IV Q 24 hrs. Goal AUC 400-550. Expected AUC: 493 SCr used: 1.15 Expected Cmin: 14.0  Renal function has improved since admission, will increase Cefepime to 2g q12h  MRSA PCR ordered   Weight: 93.5 kg (206 lb 2.1 oz)  Temp (24hrs), Avg:97.9 F (36.6 C), Min:97.4 F (36.3 C), Max:98.6 F (37 C)  Recent Labs  Lab 08/14/21 1518 08/14/21 1653 08/14/21 1910 08/15/21 0558  WBC 25.1*  --   --  23.6*  CREATININE 1.81*  --   --  1.15*  LATICACIDVEN  --  2.5* 2.2*  --     Estimated Creatinine Clearance: 44.5 mL/min (A) (by C-G formula based on SCr of 1.15 mg/dL (H)).    Allergies  Allergen Reactions   Ace Inhibitors Hives   Beta Adrenergic Blockers     Junctional bradycardia   Gabapentin Hives   Lisinopril Hives   Lyrica [Pregabalin] Hives   Shrimp [Shellfish Allergy] Swelling    Swelling of the lips    Antimicrobials this admission: Cefepime 6/11 >>  Vancomycin 6/11 >>   Dose adjustments this admission: 6/12 increased Cefepime from 2g q24h to q12h  Microbiology results: 6/11 BCx: no growth to date 6/12 MRSA PCR: pending  Thank you for allowing pharmacy to be a part of this patient's care.  Paulina Fusi, PharmD, BCPS 08/15/2021 1:16 PM

## 2021-08-15 NOTE — Progress Notes (Signed)
Dr. Tawanna Solo told me he is going to have the patient go back on Bipap. I called RT and they are coming now d/t patient not being able to keep her sats above 90% on high flow at rest.

## 2021-08-15 NOTE — Progress Notes (Signed)
Called by RN, for PT stating that willing to wear CPAP at this time. RT arrived at bedside, Placed patient on cpap (Auto-set) with a 5L O2 bleed, SpO2 96%, HR 101, RR 20. RT re-enforced the importance and need for patient to wear CPAP throughout the night, PT stated that she would and is currently tolerating at this time.

## 2021-08-15 NOTE — Progress Notes (Signed)
Informed by RN that PT only wore CPAP for about 45 min's. Placed back on 5L [place] Did not tolerate - Refused.

## 2021-08-15 NOTE — Progress Notes (Signed)
PT assessed for NIV/CPAP use at HS. PT stated that she does not want to wear CPAP. Upon last visit, the PT was also refusing to wear CPAP at HS.

## 2021-08-15 NOTE — Progress Notes (Addendum)
PROGRESS NOTE  Karen [name]  URK:[id number] DOB: [birth year] DOA: 08/14/2021 PCP: Margarita Rana, MD   Brief Narrative: Patient is a 76 year old female with history of diastolic congestive heart failure, chronic anemia, CKD stage IV, OSA,COPD, diabetes type 2, hypertension, hyperlipidemia who was recently hospitalized here and was discharged on 08/10/2021 to SNF presented again with left-sided chest pain, shortness of breath.  She is on 3 L of oxygen chronically.  On presentation she was tachypneic, short of breath.  She was recently diagnosed with pneumonia in the skilled nursing facility about 2 to 3 days ago before she presented to the emergency department.  Lab work showed AKI, leukocytosis, hyponatremia, elevated lactate.  Code sepsis was activated.  Given IV fluids but stopped after findings showed features of CHF exacerbation.  She was started on broad-spectrum antibiotics.  She was also noted to be in A-fib with RVR on presentation and was started on Cardizem drip.  Assessment & Plan:  Principal Problem:   HAP (hospital-acquired pneumonia) Active Problems:   Acute on chronic heart failure with preserved ejection fraction (HFpEF) (HCC)   Hyponatremia   Sepsis due to pneumonia (HCC)   Chronic respiratory failure with hypoxia (HCC)   Hypokalemia   Uncontrolled type 2 diabetes mellitus with hyperglycemia, with long-term current use of insulin (HCC)   AF (paroxysmal atrial fibrillation) (HCC)   Acute kidney injury superimposed on CKD (HCC)   OSA (obstructive sleep apnea)   COPD (chronic obstructive pulmonary disease) (HCC)   Chest pain   Gastroesophageal reflux disease without esophagitis  Sepsis secondary to hospital-acquired pneumonia: She was recently hospitalized here and was discharged to skilled nursing facility on 08/10/2021.  She presented with shortness of breath, chest pain.  She was diagnosed with pneumonia at skilled nursing facility.  She was found to be have tachypnea,  leukocytosis.  Found to have infiltrate on the left lower lobe on the x-ray.  Being managed for hospital-acquired pneumonia.  Currently on cefepime and vancomycin.  Acute on chronic hypoxic respiratory failure: Chronically on 3 L of oxygen per minute.  Has history of CHF, COPD, OSA, pulmonary hypertension.  Currently on high flow oxygen.  RT following.  We will continue to monitor her chest imagings.  Continue current antibiotics, supplemental oxygen.  She was desaturating even on high flow, will give a trial of BiPAP.  We will continue chest x-ray today  Acute on chronic diastolic congestive heart failure: Last echo done on 08/04/2021 showed normal EF, grade 3 diastolic dysfunction, TR.  Features of pulmonary edema on presentation as per chest x-ray.  Started on Lasix IV. We consulted cardiology  Paroxysmal A-fib: On presentation she was in A-fib with RVR.  Started on on Cardizem infusion.On eliquis for anticoagulation  Hyponatremia: Presented with sodium of 119.  She has history of chronic hyponatremia.  Sodium improving with IV Lasix.  Likely contributed to by hypervolemic hyponatremia from CHF and also hyperglycemia  Uncontrolled type 2 diabetes: Uses insulin at baseline.  Continue current insulin regimen.  Diabetic coordinator will be consulted.  Hypokalemia: Supplemented and corrected  OSA: On CPAP.  History of GERD: Continue PPI  Chest pain:  EKG showed sinus tachycardia.  Chest wall was tender on palpitation.  Most likely atypical chest pain.  Troponins negative.  Still complaining of chest pain on the left side  AKI on CKD stage IIIa: Presented with creatinine of 1.8.  Currently kidney function at baseline.  COPD: Continue supplemental oxygen, bronchodilators  Morbid obesity: BMI more than 35.  Goals of care: Elderly patient with multiple comorbidities, recurrent admission.  CODE STATUS DNR.  Consulted palliative care       DVT prophylaxis: apixaban (ELIQUIS) tablet 5 mg      Code Status: DNR  Family Communication: Husband at the bedside  Patient status: Inpatient  Patient is from : Skilled nursing facility  Anticipated discharge to: Skilled nursing facility  Estimated DC date: Not sure   Consultants: Cardiology  Procedures:none  Antimicrobials:  Anti-infectives (From admission, onward)    Start     Dose/Rate Route Frequency Ordered Stop   08/15/21 1600  ceFEPIme (MAXIPIME) 1 g in sodium chloride 0.9 % 100 mL IVPB  Status:  Discontinued        1 g 200 mL/hr over 30 Minutes Intravenous Every 24 hours 08/14/21 1840 08/14/21 1853   08/15/21 1600  ceFEPIme (MAXIPIME) 2 g in sodium chloride 0.9 % 100 mL IVPB        2 g 200 mL/hr over 30 Minutes Intravenous Every 24 hours 08/14/21 1853     08/14/21 1645  vancomycin (VANCOCIN) IVPB 1000 mg/200 mL premix        1,000 mg 200 mL/hr over 60 Minutes Intravenous  Once 08/14/21 1642 08/14/21 1844   08/14/21 1645  ceFEPIme (MAXIPIME) 2 g in sodium chloride 0.9 % 100 mL IVPB        2 g 200 mL/hr over 30 Minutes Intravenous  Once 08/14/21 1642 08/14/21 1748       Subjective: Patient seen and examined at the bedside this morning.  Hemodynamically stable.  Husband at the bedside.  She was saturating in 80s even on high flow oxygen.  She was complained of shortness of breath but not in acute respiratory distress.  Complains of left-sided chest pain.  Objective: Vitals:   08/15/21 0425 08/15/21 0818 08/15/21 0928 08/15/21 0952  BP: 134/71 136/78    Pulse: (!) 103 96    Resp: (!) 26 (!) 26    Temp: 98.6 F (37 C) (!) 97.4 F (36.3 C)    TempSrc: Oral Oral    SpO2: 90% 91% 92% 94%  Weight:        Intake/Output Summary (Last 24 hours) at 08/15/2021 1048 Last data filed at 08/15/2021 1042 Gross per 24 hour  Intake 1130 ml  Output 1550 ml  Net -420 ml   Filed Weights   08/14/21 1452  Weight: 93.5 kg    Examination:  General exam: Very deconditioned elderly female, chronically ill looking,  obese HEENT: PERRL Respiratory system: Diminished air sounds on the left side Cardiovascular system: Irregularly irregular rhythm Gastrointestinal system: Abdomen is nondistended, soft and nontender. Central nervous system: Alert and oriented Extremities: No edema, no clubbing ,no cyanosis Skin: No rashes, no ulcers,no icterus     Data Reviewed: I have personally reviewed following labs and imaging studies  CBC: Recent Labs  Lab 08/14/21 1518 08/15/21 0558  WBC 25.1* 23.6*  NEUTROABS 22.1*  --   HGB 11.0* 10.7*  HCT 33.6* 32.1*  MCV 86.4 85.1  PLT 346 144   Basic Metabolic Panel: Recent Labs  Lab 08/14/21 1518 08/15/21 0558  NA 119* 124*  K 4.2 4.2  CL 80* 85*  CO2 25 27  GLUCOSE 458* 347*  BUN 69* 58*  CREATININE 1.81* 1.15*  CALCIUM 8.9 8.9     Recent Results (from the past 240 hour(s))  Blood culture (routine x 2)     Status: None (Preliminary result)   Collection Time: 08/14/21  4:53 PM   Specimen: BLOOD  Result Value Ref Range Status   Specimen Description BLOOD [facility]  Final   Special Requests BOTTLES DRAWN AEROBIC AND ANAEROBIC BCLV  Final   Culture   Final    NO GROWTH < 24 HOURS Performed at [facility], [address] [place], [place] [address]    Report Status PENDING  Incomplete  Blood culture (routine x 2)     Status: None (Preliminary result)   Collection Time: 08/14/21  4:55 PM   Specimen: BLOOD  Result Value Ref Range Status   Specimen Description BLOOD RIGHT ANTECUBITAL  Final   Special Requests   Final    BOTTLES DRAWN AEROBIC AND ANAEROBIC Blood Culture adequate volume   Culture   Final    NO GROWTH < 12 HOURS Performed at [facility], [address] [place], [place] [address]    Report Status PENDING  Incomplete     Radiology Studies: DG Chest Portable 1 View  Result Date: 08/14/2021 CLINICAL DATA:  Chest pain, pneumonia EXAM: PORTABLE CHEST 1 VIEW COMPARISON:  None Available. FINDINGS: Cardiac silhouette  appears increased in volume. Central venous congestion increased. Increased LEFT lower lobe density. No pneumothorax. IMPRESSION: 1. Increase in cardiac silhouette and venous congestion suggest congestive heart failure. 2. LEFT lobe atelectasis versus pneumonia. Electronically Signed   By: Suzy Bouchard M.D.   On: 08/14/2021 16:31    Scheduled Meds:  allopurinol  100 mg Oral Daily   apixaban  5 mg Oral BID   atorvastatin  10 mg Oral Daily   budesonide (PULMICORT) nebulizer solution  0.25 mg Nebulization BID   empagliflozin  10 mg Oral Daily   ferrous sulfate  325 mg Oral Q breakfast   fluticasone  2 spray Each Nare Daily   furosemide  40 mg Intravenous Q6H   insulin aspart  0-20 Units Subcutaneous TID WC   insulin glargine-yfgn  18 Units Subcutaneous BID   ipratropium  2 spray Each Nare QID   lidocaine  1 patch Transdermal Q24H   melatonin  5 mg Oral QHS   montelukast  10 mg Oral QHS   pantoprazole  40 mg Oral Daily   spironolactone  12.5 mg Oral Daily   umeclidinium-vilanterol  1 puff Inhalation Daily   vitamin B-12  500 mcg Oral Daily   Continuous Infusions:  ceFEPime (MAXIPIME) IV     diltiazem (CARDIZEM) infusion 7.5 mg/hr (08/15/21 0008)     LOS: 1 day   Shelly Coss, MD [facility] P6/02/2022, 10:48 AM

## 2021-08-15 NOTE — Progress Notes (Signed)
Admitted to 2A this shift, settled into bed and oriented to unit. Started IVF, diltiazem gtt, subsequently titrated to 7.5 with rates currently controlled in 95-105 range. Given ordered IV K after clarifying with on call provider. Had asked for CPAP which respiratory placed, but only tolerated ~45 minutes. Otherwise stable O2 requirement at 5L. Hyperglycemic, given insulin as ordered by on-call provider. Continues to describe discomfort to rib cage region--all ordered interventions provided.

## 2021-08-16 ENCOUNTER — Inpatient Hospital Stay: Payer: Medicare Other

## 2021-08-16 ENCOUNTER — Inpatient Hospital Stay (HOSPITAL_COMMUNITY)
Admit: 2021-08-16 | Discharge: 2021-08-16 | Disposition: A | Discharge status: Home / Self Care | Payer: Medicare Other | Attending: Internal Medicine | Admitting: Internal Medicine

## 2021-08-16 DIAGNOSIS — I4821 Permanent atrial fibrillation: Secondary | ICD-10-CM | POA: Diagnosis not present

## 2021-08-16 DIAGNOSIS — I3139 Other pericardial effusion (noninflammatory): Secondary | ICD-10-CM | POA: Diagnosis present

## 2021-08-16 DIAGNOSIS — Y95 Nosocomial condition: Secondary | ICD-10-CM | POA: Diagnosis not present

## 2021-08-16 DIAGNOSIS — J189 Pneumonia, unspecified organism: Secondary | ICD-10-CM | POA: Diagnosis not present

## 2021-08-16 DIAGNOSIS — I272 Pulmonary hypertension, unspecified: Secondary | ICD-10-CM | POA: Diagnosis not present

## 2021-08-16 DIAGNOSIS — Z7189 Other specified counseling: Secondary | ICD-10-CM

## 2021-08-16 LAB — BLOOD GAS, ARTERIAL
Acid-Base Excess: 6.6 mmol/L — ABNORMAL HIGH (ref 0.0–2.0)
Bicarbonate: 32.4 mmol/L — ABNORMAL HIGH (ref 20.0–28.0)
O2 Content: 15 L/min
O2 Saturation: 98.9 %
Patient temperature: 37
pCO2 arterial: 50 mmHg — ABNORMAL HIGH (ref 32–48)
pH, Arterial: 7.42 (ref 7.35–7.45)
pO2, Arterial: 96 mmHg (ref 83–108)

## 2021-08-16 LAB — BODY FLUID CELL COUNT WITH DIFFERENTIAL
Eos, Fluid: 0 %
Lymphs, Fluid: 7 %
Monocyte-Macrophage-Serous Fluid: 19 %
Neutrophil Count, Fluid: 74 %
Total Nucleated Cell Count, Fluid: 2297 cu mm

## 2021-08-16 LAB — PROTEIN, PLEURAL OR PERITONEAL FLUID: Total protein, fluid: 3.5 g/dL

## 2021-08-16 LAB — CBC
HCT: 34.3 % — ABNORMAL LOW (ref 36.0–46.0)
Hemoglobin: 11.1 g/dL — ABNORMAL LOW (ref 12.0–15.0)
MCH: 28.2 pg (ref 26.0–34.0)
MCHC: 32.4 g/dL (ref 30.0–36.0)
MCV: 87.3 fL (ref 80.0–100.0)
Platelets: 371 10*3/uL (ref 150–400)
RBC: 3.93 MIL/uL (ref 3.87–5.11)
RDW: 14.5 % (ref 11.5–15.5)
WBC: 25.1 10*3/uL — ABNORMAL HIGH (ref 4.0–10.5)
nRBC: 0 % (ref 0.0–0.2)

## 2021-08-16 LAB — GLUCOSE, CAPILLARY
Glucose-Capillary: 208 mg/dL — ABNORMAL HIGH (ref 70–99)
Glucose-Capillary: 234 mg/dL — ABNORMAL HIGH (ref 70–99)
Glucose-Capillary: 305 mg/dL — ABNORMAL HIGH (ref 70–99)
Glucose-Capillary: 188 mg/dL — ABNORMAL HIGH (ref 70–99)

## 2021-08-16 LAB — BASIC METABOLIC PANEL
Anion gap: 10 (ref 5–15)
BUN: 44 mg/dL — ABNORMAL HIGH (ref 8–23)
CO2: 30 mmol/L (ref 22–32)
Calcium: 9 mg/dL (ref 8.9–10.3)
Chloride: 90 mmol/L — ABNORMAL LOW (ref 98–111)
Creatinine, Ser: 1.04 mg/dL — ABNORMAL HIGH (ref 0.44–1.00)
GFR, Estimated: 55 mL/min — ABNORMAL LOW (ref >60)
Glucose, Bld: 151 mg/dL — ABNORMAL HIGH (ref 70–99)
Potassium: 3.4 mmol/L — ABNORMAL LOW (ref 3.5–5.1)
Sodium: 130 mmol/L — ABNORMAL LOW (ref 135–145)

## 2021-08-16 LAB — ECHOCARDIOGRAM LIMITED
S' Lateral: 2.03 cm
Weight: 3298.08 oz

## 2021-08-16 MED ORDER — POTASSIUM CHLORIDE CRYS ER 20 MEQ PO TBCR
40.0000 meq | EXTENDED_RELEASE_TABLET | Freq: Once | ORAL | Status: AC
  Administered 2021-08-16: 40 meq via ORAL
  Filled 2021-08-16: qty 2

## 2021-08-16 NOTE — Progress Notes (Signed)
Progress Note  Patient Name: Karen [name] Date of Encounter: 08/16/2021  [facility] Cardiologist: Ida Rogue, MD   Subjective   Patient was seen on AM rounds. Denies any chest pain or worsening shortness of breath. She has not been able to tolerate Bipap and has been on high flow [place] at 15L. -1.3L output in the last 24 hours. Atrial fibrillation has been rate controlled.   Inpatient Medications    Scheduled Meds:  allopurinol  100 mg Oral Daily   apixaban  5 mg Oral BID   atorvastatin  10 mg Oral Daily   budesonide (PULMICORT) nebulizer solution  0.25 mg Nebulization BID   diltiazem  60 mg Oral BID   empagliflozin  10 mg Oral Daily   ferrous sulfate  325 mg Oral Q breakfast   fluticasone  2 spray Each Nare Daily   furosemide  40 mg Intravenous BID   insulin aspart  0-20 Units Subcutaneous TID WC   insulin aspart  10 Units Subcutaneous TID WC   insulin glargine-yfgn  25 Units Subcutaneous BID   ipratropium  2 spray Each Nare QID   lidocaine  1 patch Transdermal Q24H   melatonin  5 mg Oral QHS   montelukast  10 mg Oral QHS   pantoprazole  40 mg Oral Daily   spironolactone  12.5 mg Oral Daily   umeclidinium-vilanterol  1 puff Inhalation Daily   vitamin B-12  500 mcg Oral Daily   Continuous Infusions:  ceFEPime (MAXIPIME) IV 2 g (08/16/21 1017)   PRN Meds: acetaminophen, albuterol, ipratropium-albuterol, oxyCODONE, senna-docusate, traZODone   Vital Signs    Vitals:   08/16/21 1130 08/16/21 1145 08/16/21 1149 08/16/21 1300  BP:  119/72    Pulse: (!) 107  (!) 111 100  Resp: (!) 34  16 (!) 27  Temp:  97.6 F (36.4 C)    TempSrc:  Oral    SpO2: 90% 94% 93% 92%  Weight:        Intake/Output Summary (Last 24 hours) at 08/16/2021 1317 Last data filed at 08/16/2021 1150 Gross per 24 hour  Intake 926.25 ml  Output 2200 ml  Net -1273.75 ml      08/14/2021    2:52 PM 08/10/2021    5:15 AM 08/09/2021    5:16 AM  Last 3 Weights  Weight (lbs) 206 lb 2.1 oz 199  lb 8.3 oz 201 lb 11.5 oz  Weight (kg) 93.5 kg 90.5 kg 91.5 kg      Telemetry    Rate controlled atrial fibrillation rate 90-100 - Personally Reviewed  ECG    No new tracings- Personally Reviewed  Physical Exam   GEN: In mild distress after laying flat in bed to determine if she could tolerate going for CT scan Neck: No JVD appreciated Cardiac: irregularly irregular, no murmurs, rubs, or gallops.  Respiratory: Diminished to auscultation bilaterally. Respirations are mildly labored with accessory muscle use on high flow [place] at 15 L GI: Soft, nontender, non-distended , obese, bowel sounds present in all four quadrants MS: Trace edema to bilateral lower extremities; No deformity. Neuro:  Nonfocal  Psych: Normal affect   Labs    High Sensitivity Troponin:   Recent Labs  Lab 08/03/21 1324 08/03/21 1549 08/14/21 1518 08/14/21 1653  TROPONINIHS 11 10 16 12      Chemistry Recent Labs  Lab 08/14/21 1518 08/15/21 0558 08/15/21 1231 08/16/21 0525  NA 119* 124*  --  130*  K 4.2 4.2  --  3.4*  CL 80* 85*  --  90*  CO2 25 27  --  30  GLUCOSE 458* 347* 401* 151*  BUN 69* 58*  --  44*  CREATININE 1.81* 1.15*  --  1.04*  CALCIUM 8.9 8.9  --  9.0  PROT 7.1  --   --   --   ALBUMIN 3.1*  --   --   --   AST 24  --   --   --   ALT 20  --   --   --   ALKPHOS 91  --   --   --   BILITOT 0.9  --   --   --   GFRNONAA 28* 49*  --  55*  ANIONGAP 14 12  --  10    Lipids No results for input(s): "CHOL", "TRIG", "HDL", "LABVLDL", "LDLCALC", "CHOLHDL" in the last 168 hours.  Hematology Recent Labs  Lab 08/14/21 1518 08/15/21 0558 08/16/21 0525  WBC 25.1* 23.6* 25.1*  RBC 3.89 3.77* 3.93  HGB 11.0* 10.7* 11.1*  HCT 33.6* 32.1* 34.3*  MCV 86.4 85.1 87.3  MCH 28.3 28.4 28.2  MCHC 32.7 33.3 32.4  RDW 14.6 14.5 14.5  PLT 346 291 371   Thyroid No results for input(s): "TSH", "FREET4" in the last 168 hours.  BNP Recent Labs  Lab 08/14/21 1530  BNP 85.0    DDimer No results for  input(s): "DDIMER" in the last 168 hours.   Radiology    DG Chest Port 1 View  Result Date: 08/15/2021 CLINICAL DATA:  Dyspnea. EXAM: PORTABLE CHEST 1 VIEW COMPARISON:  08/14/2021 FINDINGS: Stable marked cardiomegaly. Stable pulmonary vascular congestion. Persistent opacification of left retrocardiac lung base is seen which may be due to atelectasis or pneumonia. IMPRESSION: Stable cardiomegaly and pulmonary vascular congestion. Persistent left retrocardiac atelectasis versus pneumonia. Electronically Signed   By: Marlaine Hind M.D.   On: 08/15/2021 12:57   DG Chest Portable 1 View  Result Date: 08/14/2021 CLINICAL DATA:  Chest pain, pneumonia EXAM: PORTABLE CHEST 1 VIEW COMPARISON:  None Available. FINDINGS: Cardiac silhouette appears increased in volume. Central venous congestion increased. Increased LEFT lower lobe density. No pneumothorax. IMPRESSION: 1. Increase in cardiac silhouette and venous congestion suggest congestive heart failure. 2. LEFT lobe atelectasis versus pneumonia. Electronically Signed   By: Suzy Bouchard M.D.   On: 08/14/2021 16:31    Cardiac Studies  Echocardiogram completed on 08/04/2021 1. Left ventricular ejection fraction, by estimation, is 55 to 60%. The  left ventricle has normal function. The left ventricle has no regional  wall motion abnormalities. Left ventricular diastolic parameters are  consistent with Grade III diastolic  dysfunction (restrictive). Elevated left atrial pressure. There is the  interventricular septum is flattened in systole, consistent with right  ventricular pressure overload.   2. Right ventricular systolic function is mildly reduced. The right  ventricular size is moderately enlarged. There is moderately elevated  pulmonary artery systolic pressure.   3. Left atrial size was mildly dilated.   4. The mitral valve is degenerative. Mild mitral valve regurgitation. No  evidence of mitral stenosis.   5. Tricuspid valve regurgitation is  moderate.   6. The aortic valve has an indeterminant number of cusps. There is mild  calcification of the aortic valve. There is moderate thickening of the  aortic valve. Aortic valve regurgitation is not visualized. Mild to  moderate aortic valve stenosis. Aortic  valve area, by VTI measures 1.20 cm. Aortic valve mean gradient measures  10.3 mmHg.   7. The inferior vena cava is dilated in size with <50% respiratory  variability, suggesting right atrial pressure of 15 mmHg.   Patient Profile     76 y.o. female with a history of HFpEF, chronic respiratory failure with oxygen therapy, COPD secondary to prior tobacco use, diabetes type 2 ,morbid obesity, essential hypertension, hyperlipidemia, paroxysmal atrial fibrillation who is being evaluated heart failure exacerbation with atrial fibrillation RVR.  Assessment & Plan    Sepsis secondary to hospital-acquired pneumonia Luxiq respiratory failure -Diagnosed with pneumonia rehab facility -Chest x-ray reveals left lower lobe infiltrate -Patient currently on high flow oxygen at 15 L/min she is unable to tolerate BiPAP mask -Titrate FiO2 to keep O2 sats greater than equal to 90%, patient has baseline of 3 L O2 via nasal cannula -Antibiotic therapy per primary team -Lactic acid 2.2 -WBCs on admission were 23.6 now 25.1 -Supportive care -Scheduled for CT of chest  2.HFpEF -BNP 85, not in acute exacerbation -recent echocardiogram EF 83-29%, grade 3 diastolic dysfunction -Commend changing IV Lasix back to oral dosing -Continue spironolactone 12.5 mg -Daily weight, strict I&O, low-sodium diet  3.  Paroxysmal fibrillation was recently RVR which has resolved  -Continue apixaban 5 mg twice daily -She is intolerant of beat blockers -Restart calcium 60 mg twice daily -Continue cardiac monitor -High heart rate likely driven by breathing and increased oxygen demand  4.  Hypokalemia -Potassium level today 3.4 -Repleted with 40 mEq of  K-Dur -Daily BMP -Monitor, trend, and replete as needed  5.  Hyperlipidemia -Continue atorvastatin 10 mg daily -LDL 29  6.  AKI likely multifactorial to comorbidities and sepsis -Baseline creatinine 1.01 -Creatinine today 1.04 -Daily BMP  For questions or updates, please contact [facility] Please consult [email or web address] for contact info under        Signed, Janos Shampine, NP  08/16/2021, 1:17 PM

## 2021-08-16 NOTE — Plan of Care (Signed)
In to see patient. She is tired and frail. On heated high flow. Falls asleep during conversation. She states she is married with 3 children. Confirms DNR/DNI. Will need to talk to family.

## 2021-08-16 NOTE — Progress Notes (Signed)
PROGRESS NOTE  Karen [name]  WSF:[id number] DOB: 1945/09/06 DOA: 08/14/2021 PCP: Margarita Rana, MD   Brief Narrative: Patient is a 76 year old female with history of diastolic congestive heart failure, chronic anemia, CKD stage IV, OSA,COPD, diabetes type 2, hypertension, hyperlipidemia who was recently hospitalized here and was discharged on 08/10/2021 to SNF presented again with left-sided chest pain, shortness of breath.  She is on 3 L of oxygen chronically.  On presentation she was tachypneic, short of breath.  She was recently diagnosed with pneumonia in the skilled nursing facility about 2 to 3 days ago before she presented to the emergency department.  Lab work showed AKI, leukocytosis, hyponatremia, elevated lactate.  Code sepsis was activated.  Given IV fluids but stopped after findings showed features of CHF exacerbation.  She was started on broad-spectrum antibiotics.  She was also noted to be in A-fib with RVR on presentation and was started on Cardizem drip.  Cardiology consulted and following.  Hospital course remarkable for persistent requirement of high flow oxygen/BiPAP.  Palliative care consulted due to poor prognosis.  Assessment & Plan:  Principal Problem:   HAP (hospital-acquired pneumonia) Active Problems:   Acute on chronic heart failure with preserved ejection fraction (HFpEF) (HCC)   Hyponatremia   Sepsis due to pneumonia (HCC)   Chronic respiratory failure with hypoxia (HCC)   Hypokalemia   Uncontrolled type 2 diabetes mellitus with hyperglycemia, with long-term current use of insulin (HCC)   AF (paroxysmal atrial fibrillation) (HCC)   Acute kidney injury superimposed on CKD (HCC)   OSA (obstructive sleep apnea)   COPD (chronic obstructive pulmonary disease) (HCC)   Chest pain   Gastroesophageal reflux disease without esophagitis  Sepsis secondary to hospital-acquired pneumonia: She was recently hospitalized here and was discharged to skilled nursing facility  on 08/10/2021.  She presented with shortness of breath, chest pain.  She was diagnosed with pneumonia at skilled nursing facility.  She was found to be have tachypnea, leukocytosis.  Found to have infiltrate on the left lower lobe on the x-ray.  Being managed for hospital-acquired pneumonia.  Currently on cefepime and vancomycin.  Continue this antibiotic.  We will also check CT chest without contrast for further evaluation of pneumonia.  Sputum culture ordered.  Acute on chronic hypoxic respiratory failure: Chronically on 3 L of oxygen per minute.  Has history of CHF, COPD, OSA, pulmonary hypertension.  Currently on high flow oxygen.  RT following.  We will continue to monitor her chest imagings.  Continue current antibiotics, supplemental oxygen.  She was desaturating even on high flow, so we gave trial of BiPAP.  If she does not maintain her saturation on high flow oxygen or has increased workload of breathing, she needs to be put on BiPAP.  Acute on chronic diastolic congestive heart failure: Last echo done on 08/04/2021 showed normal EF, grade 3 diastolic dysfunction, TR.  Features of pulmonary edema on presentation as per chest x-ray.  Started on Lasix IV.We consulted cardiology and they are following.  Paroxysmal A-fib: On presentation she was in A-fib with RVR.  Initially started on Cardizem drip, now on oral Cardizem..On eliquis for anticoagulation  Hyponatremia: Presented with sodium of 119.  She has history of chronic hyponatremia.  Sodium improving with IV Lasix.  Likely contributed to by hypervolemic hyponatremia from CHF and also hyperglycemia.  Improving  Uncontrolled type 2 diabetes: Uses insulin at baseline.  Continue current insulin regimen.  Diabetic coordinator consulted.  Hypokalemia: Supplemented and being monitored  OSA:  On CPAP at baseline.  History of GERD: Continue PPI  Chest pain:  EKG showed sinus tachycardia.  Chest wall was tender on palpitation.  Most likely atypical  chest pain.  Troponins negative.  Cardiology following.  AKI on CKD stage IIIa: Presented with creatinine of 1.8.  Currently kidney function has improved and at baseline.  COPD: Continue supplemental oxygen, bronchodilators.  Morbid obesity: BMI more than 35.  Goals of care: Elderly patient with multiple comorbidities, recurrent admission now with severe respiratory failure, requiring high flow oxygen.  CODE STATUS DNR and patient confirms it.  Consulted palliative care for goals of care       DVT prophylaxis: apixaban (ELIQUIS) tablet 5 mg     Code Status: DNR  Family Communication: Husband at the bedside on 6/12. Called again and discussed with husband on phone on 6/13  Patient status: Inpatient  Patient is from : Skilled nursing facility  Anticipated discharge to: Skilled nursing facility  Estimated DC date: Not sure   Consultants: Cardiology  Procedures:none  Antimicrobials:  Anti-infectives (From admission, onward)    Start     Dose/Rate Route Frequency Ordered Stop   08/15/21 1600  ceFEPIme (MAXIPIME) 1 g in sodium chloride 0.9 % 100 mL IVPB  Status:  Discontinued        1 g 200 mL/hr over 30 Minutes Intravenous Every 24 hours 08/14/21 1840 08/14/21 1853   08/15/21 1600  ceFEPIme (MAXIPIME) 2 g in sodium chloride 0.9 % 100 mL IVPB  Status:  Discontinued        2 g 200 mL/hr over 30 Minutes Intravenous Every 24 hours 08/14/21 1853 08/15/21 1308   08/15/21 1400  ceFEPIme (MAXIPIME) 2 g in sodium chloride 0.9 % 100 mL IVPB        2 g 200 mL/hr over 30 Minutes Intravenous Every 12 hours 08/15/21 1308     08/15/21 1330  vancomycin (VANCOREADY) IVPB 750 mg/150 mL        750 mg 150 mL/hr over 60 Minutes Intravenous Every 24 hours 08/15/21 1231     08/14/21 1645  vancomycin (VANCOCIN) IVPB 1000 mg/200 mL premix        1,000 mg 200 mL/hr over 60 Minutes Intravenous  Once 08/14/21 1642 08/14/21 1844   08/14/21 1645  ceFEPIme (MAXIPIME) 2 g in sodium chloride 0.9 % 100  mL IVPB        2 g 200 mL/hr over 30 Minutes Intravenous  Once 08/14/21 1642 08/14/21 1748       Subjective: Patient seen and examined at the bedside this morning.  Hemodynamically stable.  On BiPAP during my evaluation, saturating in the range of 94.  She looks sleepy but comfortable.  Did not look in any respiratory distress. Very weak, deconditioned  Objective: Vitals:   08/16/21 1115 08/16/21 1130 08/16/21 1145 08/16/21 1149  BP:   119/72   Pulse: (!) 107 (!) 107  (!) 111  Resp: (!) 23 (!) 34  16  Temp:   97.6 F (36.4 C)   TempSrc:   Oral   SpO2: 94% 90% 94% 93%  Weight:        Intake/Output Summary (Last 24 hours) at 08/16/2021 1243 Last data filed at 08/16/2021 1150 Gross per 24 hour  Intake 926.25 ml  Output 2200 ml  Net -1273.75 ml   Filed Weights   08/14/21 1452  Weight: 93.5 kg    Examination:   General exam: Very deconditioned elderly female, chronically ill looking, obese HEENT:  PERRL Respiratory system: Diminished air sounds bilaterally more on the left, few crackles, rhonchi Cardiovascular system: Irregularly irregular rhythm Gastrointestinal system: Abdomen is nondistended, soft and nontender. Central nervous system: Alert and oriented Extremities: No edema, no clubbing ,no cyanosis Skin: No rashes, no ulcers,no icterus      Data Reviewed: I have personally reviewed following labs and imaging studies  CBC: Recent Labs  Lab 08/14/21 1518 08/15/21 0558 08/16/21 0525  WBC 25.1* 23.6* 25.1*  NEUTROABS 22.1*  --   --   HGB 11.0* 10.7* 11.1*  HCT 33.6* 32.1* 34.3*  MCV 86.4 85.1 87.3  PLT 346 291 528   Basic Metabolic Panel: Recent Labs  Lab 08/14/21 1518 08/15/21 0558 08/15/21 1231 08/16/21 0525  NA 119* 124*  --  130*  K 4.2 4.2  --  3.4*  CL 80* 85*  --  90*  CO2 25 27  --  30  GLUCOSE 458* 347* 401* 151*  BUN 69* 58*  --  44*  CREATININE 1.81* 1.15*  --  1.04*  CALCIUM 8.9 8.9  --  9.0     Recent Results (from the past 240  hour(s))  Blood culture (routine x 2)     Status: None (Preliminary result)   Collection Time: 08/14/21  4:53 PM   Specimen: BLOOD  Result Value Ref Range Status   Specimen Description BLOOD BR[facility]  Final   Special Requests BOTTLES DRAWN AEROBIC AND ANAEROBIC BCLV  Final   Culture   Final    NO GROWTH 2 DAYS Performed at [facility], [address] [place], [place] [address]    Report Status PENDING  Incomplete  Blood culture (routine x 2)     Status: None (Preliminary result)   Collection Time: 08/14/21  4:55 PM   Specimen: BLOOD  Result Value Ref Range Status   Specimen Description BLOOD RIGHT ANTECUBITAL  Final   Special Requests   Final    BOTTLES DRAWN AEROBIC AND ANAEROBIC Blood Culture adequate volume   Culture   Final    NO GROWTH 2 DAYS Performed at [facility], [address] [place], [place] [address]    Report Status PENDING  Incomplete  MRSA Next Gen by PCR, Nasal     Status: None   Collection Time: 08/15/21  5:50 PM   Specimen: Nasal Mucosa; Nasal Swab  Result Value Ref Range Status   MRSA by PCR Next Gen NOT DETECTED NOT DETECTED Final    Comment: (NOTE) The GeneXpert MRSA Assay (FDA approved for NASAL specimens only), is one component of a comprehensive MRSA colonization surveillance program. It is not intended to diagnose MRSA infection nor to guide or monitor treatment for MRSA infections. Test performance is not FDA approved in patients less than 70 years old. Performed at [facility], [address], [place], [place] [address]      Radiology Studies: [facility] Chest [place] 1 View  Result Date: 08/15/2021 CLINICAL DATA:  Dyspnea. EXAM: PORTABLE CHEST 1 VIEW COMPARISON:  08/14/2021 FINDINGS: Stable marked cardiomegaly. Stable pulmonary vascular congestion. Persistent opacification of left retrocardiac lung base is seen which may be due to atelectasis or pneumonia. IMPRESSION: Stable cardiomegaly and pulmonary vascular congestion.  Persistent left retrocardiac atelectasis versus pneumonia. Electronically Signed   By: Marlaine Hind M.D.   On: 08/15/2021 12:57   DG Chest Portable 1 View  Result Date: 08/14/2021 CLINICAL DATA:  Chest pain, pneumonia EXAM: PORTABLE CHEST 1 VIEW COMPARISON:  None Available. FINDINGS: Cardiac silhouette  appears increased in volume. Central venous congestion increased. Increased LEFT lower lobe density. No pneumothorax. IMPRESSION: 1. Increase in cardiac silhouette and venous congestion suggest congestive heart failure. 2. LEFT lobe atelectasis versus pneumonia. Electronically Signed   By: Suzy Bouchard M.D.   On: 08/14/2021 16:31    Scheduled Meds:  allopurinol  100 mg Oral Daily   apixaban  5 mg Oral BID   atorvastatin  10 mg Oral Daily   budesonide (PULMICORT) nebulizer solution  0.25 mg Nebulization BID   diltiazem  60 mg Oral BID   empagliflozin  10 mg Oral Daily   ferrous sulfate  325 mg Oral Q breakfast   fluticasone  2 spray Each Nare Daily   furosemide  40 mg Intravenous BID   insulin aspart  0-20 Units Subcutaneous TID WC   insulin aspart  10 Units Subcutaneous TID WC   insulin glargine-yfgn  25 Units Subcutaneous BID   ipratropium  2 spray Each Nare QID   lidocaine  1 patch Transdermal Q24H   melatonin  5 mg Oral QHS   montelukast  10 mg Oral QHS   pantoprazole  40 mg Oral Daily   spironolactone  12.5 mg Oral Daily   umeclidinium-vilanterol  1 puff Inhalation Daily   vitamin B-12  500 mcg Oral Daily   Continuous Infusions:  ceFEPime (MAXIPIME) IV 2 g (08/16/21 1017)   vancomycin Stopped (08/15/21 1708)     LOS: 2 days   Shelly Coss, MD [facility] P6/13/2023, 12:43 PM

## 2021-08-16 NOTE — Progress Notes (Signed)
Interventional Radiology Procedure Note  Date of Procedure: 08/16/2021  Procedure: US thoracentesis   Findings:  1. US thoracentesis, left side, 869ml of clear pleural fluid removed    Complications: No immediate complications noted.   Estimated Blood Loss: minimal  Follow-up and Recommendations: 1. Follow up CXR    Albin Felling, MD  Vascular & Interventional Radiology  08/16/2021 4:31 PM

## 2021-08-16 NOTE — TOC Initial Note (Signed)
Transition of Care [facility]) - Initial/Assessment Note    Patient Details  Name: Karen [name] MRN: [id number] Date of Birth: 10/15/1945  Transition of Care [facility]) CM/SW Contact:    Laurena Slimmer, RN Phone Number: 08/16/2021, 3:35 PM  Clinical Narrative:                 Paitent admitted from [facility] Per Audry Pili in admissions patient is able to go back to facility upon discharge        Patient Goals and CMS Choice        Expected Discharge Plan and Services                                                Prior Living Arrangements/Services                       Activities of Daily Living Home Assistive Devices/Equipment: Gilford Rile (specify type) ADL Screening (condition at time of admission) Patient's cognitive ability adequate to safely complete daily activities?: Yes Is the patient deaf or have difficulty hearing?: No Does the patient have difficulty seeing, even when wearing glasses/contacts?: No Does the patient have difficulty concentrating, remembering, or making decisions?: No Patient able to express need for assistance with ADLs?: Yes Does the patient have difficulty dressing or bathing?: No Independently performs ADLs?: Yes (appropriate for developmental age) Does the patient have difficulty walking or climbing stairs?: No Weakness of Legs: None Weakness of Arms/Hands: None  Permission Sought/Granted                  Emotional Assessment              Admission diagnosis:  HAP (hospital-acquired pneumonia) [J18.9, Y95] Patient Active Problem List   Diagnosis Date Noted   Sepsis due to pneumonia ([place]) 08/14/2021   HAP (hospital-acquired pneumonia) 08/14/2021   Severe pulmonary hypertension ([place]) 05/29/2021   OSA (obstructive sleep apnea) 05/29/2021   Elevated troponin 05/28/2021   Lower abdominal pain 05/28/2021   Nausea and vomiting 05/28/2021   Prolonged QT interval 05/28/2021   At risk for fall due to comorbid  condition 05/02/2021   Pleural effusion    Acute kidney injury superimposed on CKD ([place])    Impaired gait and mobility 12/20/2020   Pulmonary nodules/lesions, multiple 10/29/2020   Anemia in chronic kidney disease 10/29/2020   Aortic stenosis, moderate    AF (paroxysmal atrial fibrillation) (HCC)    Gastroesophageal reflux disease without esophagitis    Chronic respiratory failure with hypercapnia ([place]) 05/21/2020   Severe sepsis ([place]) 05/21/2020   UTI (urinary tract infection) 05/21/2020   Hyperglycemia due to type 2 diabetes mellitus ([place]) 05/21/2020   Abnormal CT of liver 05/21/2020   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 05/21/2020   Morbid obesity ([place]) 05/08/2020   Athscl heart disease 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 03/06/2020   Acute hip pain, left 10/23/2019   Lumbar stenosis with neurogenic claudication 10/23/2019   COPD with acute exacerbation ([place]) 08/27/7626   Acute diastolic CHF (congestive heart failure) ([place]) 09/28/2019   (HFpEF) heart failure with preserved ejection fraction ([place]) 09/27/2019   Hypokalemia 06/28/2019   Hyponatremia 06/28/2019   Uncontrolled type 2 diabetes mellitus with hyperglycemia, with long-term current use of insulin ([place]) 06/28/2019   CKD (chronic kidney disease), stage IIIa 06/28/2019  Atrial fibrillation, chronic ([place]) 06/28/2019   Pulmonary edema 02/27/2019   Bradycardia 02/24/2019   Acute on chronic diastolic CHF (congestive heart failure) ([place]) 02/23/2019   Chronic respiratory failure with hypoxia ([place]) 02/23/2019   Chest pain 02/13/2019   Osteopenia of neck of left femur 11/20/2018   AVM (arteriovenous malformation) of small bowel, acquired    Acute gastric ulcer with hemorrhage    Chronic diastolic heart failure ([place]) 08/22/2018   Diarrhea 08/22/2018   Junctional bradycardia    Acute on chronic heart failure with preserved ejection fraction (HFpEF) ([place])    AKI (acute kidney injury) ([place])    Symptomatic bradycardia  08/05/2018   Acute on chronic respiratory failure ([place]) 06/25/2018   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 diabetes mellitus ([place]) 01/25/2018   History of non anemic vitamin B12 deficiency 01/25/2018   Personal history of kidney stones 11/12/2017   Urge incontinence 11/12/2017   History of leukocytosis 09/17/2017   GI bleed 08/26/2017   Arthritis 08/10/2017   Stage 4 chronic kidney disease ([place]) 08/10/2017   COPD (chronic obstructive pulmonary disease) ([place]) 08/10/2017   Diabetes mellitus type 2, uncomplicated ([place]) 76/54/6503   Hypertension 08/10/2017   Obesity (BMI 35.0-39.9 without comorbidity) 04/11/2017   Primary osteoarthritis of right knee 09/01/2016   Leucocytosis 10/19/2015   Asterixis 01/07/2015   Acute on chronic respiratory failure with hypoxia and hypercapnia (HCC) 01/07/2015   Sciatica 01/07/2015   Weakness 01/07/2015   Chronic 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 01/04/2015   Iron deficiency anemia 06/22/2014   Microalbuminuria 06/22/2014   CHF (congestive heart failure) ([place]) 04/20/2014   Edema, peripheral 04/20/2014   PCP:  Margarita Rana, MD Pharmacy:   [facility] [address], [place] [address] Phone: [phone] Fax: [phone]     Social Determinants of Health (SDOH) Interventions    Readmission Risk Interventions    10/30/2020    4:17 PM 06/21/2020    3:03 PM 09/30/2019   11:16 AM  Readmission Risk Prevention Plan  Transportation Screening Complete Complete Complete  PCP or Specialist Appt within 3-5 Days   Complete  Social Work Consult for [place] Planning/Counseling   Complete  Palliative Care Screening   Not Applicable  Medication Review Press photographer) Complete Complete Complete  PCP or Specialist appointment within 3-5 days of discharge Complete Complete   HRI or Home Care Consult Patient refused Complete   SW Recovery Care/Counseling Consult Complete  Complete   Palliative Care Screening Not Applicable Not [place] Not Applicable Not Applicable

## 2021-08-17 DIAGNOSIS — Y95 Nosocomial condition: Secondary | ICD-10-CM | POA: Diagnosis not present

## 2021-08-17 DIAGNOSIS — I5033 Acute on chronic diastolic (congestive) heart failure: Secondary | ICD-10-CM

## 2021-08-17 DIAGNOSIS — Z7189 Other specified counseling: Secondary | ICD-10-CM | POA: Diagnosis not present

## 2021-08-17 DIAGNOSIS — I5030 Unspecified diastolic (congestive) heart failure: Secondary | ICD-10-CM

## 2021-08-17 DIAGNOSIS — J9621 Acute and chronic respiratory failure with hypoxia: Secondary | ICD-10-CM | POA: Diagnosis not present

## 2021-08-17 DIAGNOSIS — I480 Paroxysmal atrial fibrillation: Secondary | ICD-10-CM

## 2021-08-17 DIAGNOSIS — J189 Pneumonia, unspecified organism: Secondary | ICD-10-CM | POA: Diagnosis not present

## 2021-08-17 LAB — BASIC METABOLIC PANEL
Anion gap: 8 (ref 5–15)
BUN: 46 mg/dL — ABNORMAL HIGH (ref 8–23)
CO2: 32 mmol/L (ref 22–32)
Calcium: 8.8 mg/dL — ABNORMAL LOW (ref 8.9–10.3)
Chloride: 93 mmol/L — ABNORMAL LOW (ref 98–111)
Creatinine, Ser: 1.01 mg/dL — ABNORMAL HIGH (ref 0.44–1.00)
GFR, Estimated: 57 mL/min — ABNORMAL LOW (ref >60)
Glucose, Bld: 179 mg/dL — ABNORMAL HIGH (ref 70–99)
Potassium: 3.7 mmol/L (ref 3.5–5.1)
Sodium: 133 mmol/L — ABNORMAL LOW (ref 135–145)

## 2021-08-17 LAB — CBC
HCT: 32.7 % — ABNORMAL LOW (ref 36.0–46.0)
Hemoglobin: 10.4 g/dL — ABNORMAL LOW (ref 12.0–15.0)
MCH: 28 pg (ref 26.0–34.0)
MCHC: 31.8 g/dL (ref 30.0–36.0)
MCV: 88.1 fL (ref 80.0–100.0)
Platelets: 327 10*3/uL (ref 150–400)
RBC: 3.71 MIL/uL — ABNORMAL LOW (ref 3.87–5.11)
RDW: 14.6 % (ref 11.5–15.5)
WBC: 14 10*3/uL — ABNORMAL HIGH (ref 4.0–10.5)
nRBC: 0 % (ref 0.0–0.2)

## 2021-08-17 LAB — GLUCOSE, CAPILLARY
Glucose-Capillary: 182 mg/dL — ABNORMAL HIGH (ref 70–99)
Glucose-Capillary: 295 mg/dL — ABNORMAL HIGH (ref 70–99)
Glucose-Capillary: 174 mg/dL — ABNORMAL HIGH (ref 70–99)
Glucose-Capillary: 178 mg/dL — ABNORMAL HIGH (ref 70–99)

## 2021-08-17 MED ORDER — CHLORHEXIDINE GLUCONATE 0.12 % MT SOLN
15.0000 mL | Freq: Two times a day (BID) | OROMUCOSAL | Status: DC
  Administered 2021-08-17 – 2021-08-24 (×15): 15 mL via OROMUCOSAL
  Filled 2021-08-17 (×15): qty 15

## 2021-08-17 MED ORDER — ORAL CARE MOUTH RINSE
15.0000 mL | Freq: Two times a day (BID) | OROMUCOSAL | Status: DC
  Administered 2021-08-20 – 2021-08-23 (×6): 15 mL via OROMUCOSAL

## 2021-08-17 NOTE — Consult Note (Cosign Needed)
Consultation Note Date: 08/17/2021   Patient Name: Karen [name]  DOB: 10/09/1945  MRN: [id number]  Age / Sex: 76 y.o., female  PCP: Margarita Rana, MD Referring Physician: Wyvonnia Dusky, MD  Reason for Consultation: Establishing goals of care  HPI/Patient Profile: Patient is a 76 year old female with history of diastolic congestive heart failure, chronic anemia, CKD stage IV, OSA,COPD, diabetes type 2, hypertension, hyperlipidemia who was recently hospitalized here and was discharged to SNF. She presented again with left-sided chest pain, shortness of breath.  She is on 3 L of oxygen chronically.  On presentation she was tachypneic, short of breath.  Per EMR, she was recently diagnosed with pneumonia in the skilled nursing facility about 2 to 3 days ago before she presented to the emergency department.  Code sepsis initiated. Given IV fluids but stopped after findings showed features of CHF exacerbation.  She was started on broad-spectrum antibiotics.  She was also noted to be in A-fib with RVR on presentation and was started on Cardizem drip.  Cardiology consulted and following.  Hospital course remarkable for persistent requirement of high flow oxygen/BiPAP.  Palliative care was consulted due to poor prognosis.  Clinical Assessment and Goals of Care: Notes and labs reviewed. She is followed by outpatient palliative.  In to see patient. She appears tired and frail. She is resting in bed on heated high flow. No family at bedside. She is able to converse with me, but intermittently falls asleep during conversation, and is awakened to continue talking.   She states she is married and at baseline lives at home with her husband. She tells me she has 3 children. She states at baseline she is not very active due to shortness of breath, and spends a lot of time on the couch.She uses O2 at baseline. She tells me  her QOL is not what she would want it to be.  Confirms DNR and states she would not want intubation. Will need to talk to family.   SUMMARY OF RECOMMENDATIONS    Will need family as part of conversation regarding plans moving forward.   Prognosis:  < 6 months     Primary Diagnoses: Present on Admission:  Acute on chronic heart failure with preserved ejection fraction (HFpEF) (HCC)  Chronic respiratory failure with hypoxia (HCC)  Acute kidney injury superimposed on CKD (HCC)  OSA (obstructive sleep apnea)  AF (paroxysmal atrial fibrillation) (HCC)  COPD (chronic obstructive pulmonary disease) (HCC)  Gastroesophageal reflux disease without esophagitis  (Resolved) Lobar pneumonia (HCC)  Hypokalemia  Sepsis due to pneumonia (HCC)  HAP (hospital-acquired pneumonia)   I have reviewed the medical record, interviewed the patient and family, and examined the patient. The following aspects are pertinent.  Past Medical History:  Diagnosis Date   (HFpEF) heart failure with preserved ejection fraction ([place])    a. 2017 Echo: EF 50%; b. 06/2018 Echo: EF 50-55%; c. 08/2018 Echo: EF 50-55%, Nl RV fxn; d. 09/2019 Echo: EF 55-60%, no rwma, mild LVH, Gr1 DD, nl RV size/fxn,  PASP 63.30mmHg. Mildly dil LA. Triv MR. Mod AS (AoV 0.94cm^2 VTI; mean grad 17.63mmHg); d. 02/2021 Echo: EF 60-65%, no rwma, GrI DD, mildly red RV fxn, RVSP 64.23mmHg, mild-mod MR, mod-sev TR, mild AS.   Acute on chronic respiratory failure with hypoxia and hypercapnia (HCC) 01/07/2015   Anemia    Asterixis 01/07/2015   Asthma    Cataract    CKD (chronic kidney disease), stage III (HCC)    COPD (chronic obstructive pulmonary disease) ([place])    a. 06/2018 tobacco use, home 3L oxygen    Diabetes mellitus without complication ([place])    a. 09/2019 A1C 8.8   Edema, peripheral 04/20/2014   GI bleed 06/28/2019   History of kidney stones    Hyperlipidemia    Hypertension    Iron deficiency anemia 06/22/2014   Junctional bradycardia     a. In setting of beta blocker therapy.   Leucocytosis 10/19/2015   Moderate aortic stenosis    a.  09/2019 Echo: Mod AS (AoV 0.94cm^2 VTI; mean grad 17.80mmHg); b. 02/2021 Echo: Mild AS.   Morbid obesity (HCC)    Overactive bladder    Primary osteoarthritis of right knee 09/01/2016   Sciatica 01/07/2015   Valvular heart disease    a. 02/2021 Echo: EF 60-65%, no rwma, GrI DD, mild-mod MR, mod-sev TR, mild AS   Social History   Socioeconomic History   Marital status: Married    Spouse name: Not on file   Number of children: Not on file   Years of education: Not on file   Highest education level: Not on file  Occupational History   Not on file  Tobacco Use   Smoking status: Former    Packs/day: 1.00    Years: 20.00    Total pack years: 20.00    Types: Cigarettes    Quit date: 12/04/1992    Years since quitting: 28.7   Smokeless tobacco: Never  Vaping Use   Vaping Use: Never used  Substance and Sexual Activity   Alcohol use: No   Drug use: No   Sexual activity: Not Currently  Other Topics Concern   Not on file  Social History Narrative   Not on file   Social Determinants of Health   Financial Resource Strain: Not on file  Food Insecurity: Not on file  Transportation Needs: Not on file  Physical Activity: Not on file  Stress: Not on file  Social Connections: Not on file   Family History  Problem Relation Age of Onset   Other Mother        unknown medical history   Other Father        unknown medical history   Scheduled Meds:  allopurinol  100 mg Oral Daily   atorvastatin  10 mg Oral Daily   budesonide (PULMICORT) nebulizer solution  0.25 mg Nebulization BID   chlorhexidine  15 mL Mouth Rinse BID   diltiazem  60 mg Oral BID   empagliflozin  10 mg Oral Daily   ferrous sulfate  325 mg Oral Q breakfast   fluticasone  2 spray Each Nare Daily   furosemide  40 mg Intravenous BID   insulin aspart  0-20 Units Subcutaneous TID WC   insulin aspart  10 Units  Subcutaneous TID WC   insulin glargine-yfgn  25 Units Subcutaneous BID   ipratropium  2 spray Each Nare QID   lidocaine  1 patch Transdermal Q24H   mouth rinse  15 mL Mouth Rinse q12n4p  melatonin  5 mg Oral QHS   montelukast  10 mg Oral QHS   pantoprazole  40 mg Oral Daily   spironolactone  12.5 mg Oral Daily   umeclidinium-vilanterol  1 puff Inhalation Daily   vitamin B-12  500 mcg Oral Daily   Continuous Infusions:  ceFEPime (MAXIPIME) IV Stopped (08/17/21 0651)   PRN Meds:.acetaminophen, albuterol, ipratropium-albuterol, oxyCODONE, senna-docusate, traZODone Medications Prior to Admission:  Prior to Admission medications   Medication Sig Start Date End Date Taking? Authorizing Provider  acetaminophen (TYLENOL) 500 MG tablet Take 1-2 tablets (500-1,000 mg total) by mouth every 6 (six) hours as needed for mild pain, fever, moderate pain or headache. Do not take more than 4 grams a day 02/05/21  Yes Gonfa, Bretta Bang T, MD  amoxicillin-clavulanate (AUGMENTIN) 500-125 MG tablet Take 1 tablet by mouth in the morning and at bedtime. 08/13/21  Yes [provider]  Ferrous Sulfate (IRON) 325 (65 Fe) MG TABS Take 1 tablet (325 mg total) by mouth daily. 02/12/21  Yes Loletha Grayer, MD  Fluticasone-Umeclidin-Vilant 100-62.5-25 MCG/INH AEPB Inhale 1 puff into the lungs daily. 12/25/17  Yes [provider]  guaiFENesin (MUCINEX) 600 MG 12 hr tablet Take 600 mg by mouth daily.   Yes [provider]  insulin glargine (LANTUS) 100 UNIT/ML injection Inject 24 Units into the skin 2 (two) times daily with a meal.   Yes [provider]  insulin regular (NOVOLIN R) 100 units/mL injection Inject 2-14 Units into the skin 3 (three) times daily before meals. Blood Sugar 201-250.. Give 2 units Blood Sugar 250-300. Give 4 units  Blood Sugar 301-350. Give 6 units Blood Sugar 351-400.  Give 8 units Blood Sugar 401-450 . Give 10 units Blood Sugar 451-500 Give 12 units Blood Sugar  <500 Give 14 units   Yes [provider]  ipratropium (ATROVENT) 0.06 % nasal spray Place 2 sprays into both nostrils 4 (four) times daily. 05/28/20  Yes Margarette Canada, NP  ipratropium-albuterol (DUONEB) 0.5-2.5 (3) MG/3ML SOLN Take 3 mLs by nebulization 4 (four) times daily as needed. 10/20/20  Yes [provider]  melatonin 5 MG TABS Take 5 mg by mouth at bedtime. 11/10/20  Yes [provider]  montelukast (SINGULAIR) 10 MG tablet Take 10 mg by mouth at bedtime.   Yes [provider]  senna-docusate (SENOKOT-S) 8.6-50 MG tablet Take 1 tablet by mouth 2 (two) times daily between meals as needed for mild constipation. 02/05/21  Yes Mercy Riding, MD  spironolactone (ALDACTONE) 25 MG tablet Take 0.5 tablets (12.5 mg total) by mouth daily. 08/09/21  Yes Annita Brod, MD  torsemide (DEMADEX) 20 MG tablet Take 20 mg by mouth 2 (two) times daily.   Yes [provider]  traZODone (DESYREL) 50 MG tablet Take 1 tablet (50 mg total) by mouth at bedtime as needed for sleep. 02/12/21  Yes Wieting, Richard, MD  vitamin B-12 (CYANOCOBALAMIN) 500 MCG tablet Take 500 mcg by mouth daily.   Yes [provider]  Vitamin D, Cholecalciferol, 25 MCG (1000 UT) CAPS Take 1 tablet by mouth daily.   Yes [provider]  albuterol (VENTOLIN HFA) 108 (90 Base) MCG/ACT inhaler Inhale 2 puffs into the lungs every 6 (six) hours as needed for wheezing or shortness of breath.     [provider]  allopurinol (ZYLOPRIM) 100 MG tablet Take 1 tablet by mouth daily.    [provider]  apixaban (ELIQUIS) 5 MG TABS tablet Take 1 tablet by mouth  twice daily 04/06/21   [name], MD  atorvastatin (LIPITOR) 10 MG tablet Take 1 tablet by mouth daily. 12/17/20   [provider]  diltiazem (CARDIZEM SR) 60 MG 12 hr capsule Take 1 capsule (60 mg total) by mouth 2 (two) times daily. 08/08/21   Annita Brod, MD  Dulaglutide 3 MG/0.5ML SOPN Inject  0.5 mLs into the skin once a week. 04/11/21   [provider]  empagliflozin (JARDIANCE) 10 MG TABS tablet Take 1 tablet (10 mg total) by mouth daily. 08/09/21   Annita Brod, MD  esomeprazole (NEXIUM) 40 MG capsule Take 40 mg by mouth daily. 12/17/18   [provider]  fluticasone (FLONASE) 50 MCG/ACT nasal spray Place 2 sprays into both nostrils daily. 11/15/18   [provider]  insulin glargine-yfgn (SEMGLEE) 100 UNIT/ML injection Inject 0.18 mLs (18 Units total) into the skin 2 (two) times daily. 08/08/21   Annita Brod, MD  insulin lispro (HUMALOG) 100 UNIT/ML KwikPen Inject 5 Units into the skin in the morning, at noon, and at bedtime. 10 units with meals plus additional units for correction 08/08/21   Annita Brod, MD  torsemide 40 MG TABS Take 40 mg by mouth daily. Patient not taking: Reported on 08/15/2021 08/09/21   Annita Brod, MD   Allergies  Allergen Reactions   Ace Inhibitors Hives   Beta Adrenergic Blockers     Junctional bradycardia   Gabapentin Hives   Lisinopril Hives   Lyrica [Pregabalin] Hives   Shrimp [Shellfish Allergy] Swelling    Swelling of the lips   Review of Systems  All other systems reviewed and are negative.   Physical Exam Constitutional:      Comments: Speaks but falls asleep intermittently during talking.   Pulmonary:     Comments: On heated high flow    Vital Signs: BP 110/64 (BP Location: Left Arm)   Pulse (!) 103   Temp 98 F (36.7 C) (Axillary)   Resp (!) 27   Wt 93 kg   SpO2 90%   BMI 36.32 kg/m  Pain Scale: 0-10 POSS *See Group Information*: 1-Acceptable,Awake and alert Pain Score: 9    SpO2: SpO2: 90 % O2 Device:SpO2: 90 % O2 Flow Rate: .O2 Flow Rate (L/min): 45 L/min  IO: Intake/output summary:  Intake/Output Summary (Last 24 hours) at 08/17/2021 0254 Last data filed at 08/17/2021 0800 Gross per 24 hour  Intake 680 ml  Output 2650 ml  Net -1970 ml    LBM: Last BM Date :  08/15/21 Baseline Weight: Weight: 93.5 kg Most recent weight: Weight: 93 kg      Signed by: Asencion Gowda, NP   Please contact Palliative Medicine Team phone at [phone] for questions and concerns.  For individual provider: See Shea Evans

## 2021-08-17 NOTE — Inpatient Diabetes Management (Signed)
Inpatient Diabetes Program Recommendations  AACE/ADA: New Consensus Statement on Inpatient Glycemic Control (2015)  Target Ranges:  Prepandial:   less than 140 mg/dL      Peak postprandial:   less than 180 mg/dL (1-2 hours)      Critically ill patients:  140 - 180 mg/dL   Lab Results  Component Value Date   GLUCAP 295 (H) 08/17/2021   HGBA1C 9.1 (H) 08/08/2021    Review of Glycemic Control  Latest Reference Range & Units 08/17/21 07:34 08/17/21 11:26  Glucose-Capillary 70 - 99 mg/dL 182 (H) 295 (H)  (H): Data is abnormally high  Current orders for Inpatient glycemic control: Semglee 25 units BID, Novolog 0-20 units TID, Novolog 10 units TID, Jardiance 10 mg QD  Inpatient Diabetes Program Recommendations:    Novolog 12 units TID with meals if consumes at least 50%.  Will continue to follow while inpatient.  Thank you, Reche Dixon, MSN, [place] Diabetes Coordinator Inpatient Diabetes Program [phone] (team pager from 8a-5p)

## 2021-08-17 NOTE — Progress Notes (Signed)
Progress Note  Patient Name: Karen [name] Date of Encounter: 08/17/2021  [facility] Cardiologist: Ida Rogue, MD   Subjective   Shortness of breath improving, on high flow nasal oxygen.  Eating lunch.  Edema is also better.  Net -1.3 L over the past 24 hours.  Inpatient Medications    Scheduled Meds:  allopurinol  100 mg Oral Daily   atorvastatin  10 mg Oral Daily   budesonide (PULMICORT) nebulizer solution  0.25 mg Nebulization BID   chlorhexidine  15 mL Mouth Rinse BID   diltiazem  60 mg Oral BID   empagliflozin  10 mg Oral Daily   ferrous sulfate  325 mg Oral Q breakfast   fluticasone  2 spray Each Nare Daily   furosemide  40 mg Intravenous BID   insulin aspart  0-20 Units Subcutaneous TID WC   insulin aspart  10 Units Subcutaneous TID WC   insulin glargine-yfgn  25 Units Subcutaneous BID   ipratropium  2 spray Each Nare QID   lidocaine  1 patch Transdermal Q24H   mouth rinse  15 mL Mouth Rinse q12n4p   melatonin  5 mg Oral QHS   montelukast  10 mg Oral QHS   pantoprazole  40 mg Oral Daily   spironolactone  12.5 mg Oral Daily   umeclidinium-vilanterol  1 puff Inhalation Daily   vitamin B-12  500 mcg Oral Daily   Continuous Infusions:  ceFEPime (MAXIPIME) IV Stopped (08/17/21 1256)   PRN Meds: acetaminophen, albuterol, ipratropium-albuterol, oxyCODONE, senna-docusate, traZODone   Vital Signs    Vitals:   08/17/21 0929 08/17/21 1100 08/17/21 1129 08/17/21 1540  BP:   (!) 109/59 116/66  Pulse: 93 90 98 81  Resp: (!) 22 (!) 22 (!) 27 (!) 29  Temp:   97.7 F (36.5 C) 97.9 F (36.6 C)  TempSrc:   Oral Oral  SpO2: 96% 99% 98% 94%  Weight:        Intake/Output Summary (Last 24 hours) at 08/17/2021 1603 Last data filed at 08/17/2021 1540 Gross per 24 hour  Intake 1280 ml  Output 2600 ml  Net -1320 ml      08/17/2021    5:00 AM 08/14/2021    2:52 PM 08/10/2021    5:15 AM  Last 3 Weights  Weight (lbs) 205 lb 0.4 oz 206 lb 2.1 oz 199 lb 8.3 oz   Weight (kg) 93 kg 93.5 kg 90.5 kg      Telemetry    A-fib heart rate 92- Personally Reviewed  ECG     - Personally Reviewed  Physical Exam   GEN: Mild respiratory distress Neck: No JVD Cardiac: Irregular irregular Respiratory: Rhonchorous breath sounds, diminished bilaterally GI: Soft, nontender, non-distended  MS: Trace edema; No deformity. Neuro:  Nonfocal  Psych: Normal affect   Labs    High Sensitivity Troponin:   Recent Labs  Lab 08/03/21 1324 08/03/21 1549 08/14/21 1518 08/14/21 1653  TROPONINIHS 11 10 16 12      Chemistry Recent Labs  Lab 08/14/21 1518 08/15/21 0558 08/15/21 1231 08/16/21 0525 08/17/21 0632  NA 119* 124*  --  130* 133*  K 4.2 4.2  --  3.4* 3.7  CL 80* 85*  --  90* 93*  CO2 25 27  --  30 32  GLUCOSE 458* 347* 401* 151* 179*  BUN 69* 58*  --  44* 46*  CREATININE 1.81* 1.15*  --  1.04* 1.01*  CALCIUM 8.9 8.9  --  9.0 8.8*  PROT 7.1  --   --   --   --   ALBUMIN 3.1*  --   --   --   --   AST 24  --   --   --   --   ALT 20  --   --   --   --   ALKPHOS 91  --   --   --   --   BILITOT 0.9  --   --   --   --   GFRNONAA 28* 49*  --  55* 57*  ANIONGAP 14 12  --  10 8    Lipids No results for input(s): "CHOL", "TRIG", "HDL", "LABVLDL", "LDLCALC", "CHOLHDL" in the last 168 hours.  Hematology Recent Labs  Lab 08/15/21 0558 08/16/21 0525 08/17/21 0632  WBC 23.6* 25.1* 14.0*  RBC 3.77* 3.93 3.71*  HGB 10.7* 11.1* 10.4*  HCT 32.1* 34.3* 32.7*  MCV 85.1 87.3 88.1  MCH 28.4 28.2 28.0  MCHC 33.3 32.4 31.8  RDW 14.5 14.5 14.6  PLT 291 371 327   Thyroid No results for input(s): "TSH", "FREET4" in the last 168 hours.  BNP Recent Labs  Lab 08/14/21 1530  BNP 85.0    DDimer No results for input(s): "DDIMER" in the last 168 hours.   Radiology    ECHOCARDIOGRAM LIMITED  Result Date: 08/16/2021    ECHOCARDIOGRAM LIMITED REPORT   Patient Name:   Karen [name] Date of Exam: 08/16/2021 Medical Rec #:  [id number]        Height:        63.0 in Accession #:    [id number]       Weight:       206.1 lb Date of Birth:  1945/05/02       BSA:          1.959 m Patient Age:    76 years         BP:           104/63 mmHg Patient Gender: F                HR:           91 bpm. Exam Location:  [facility] Procedure: Limited Color Doppler and Limited Echo Indications:     I31.3 Pericardial effusion  History:         Patient has prior history of Echocardiogram examinations, most                  recent 08/04/2021. CHF, COPD and Pulmonary HTN; Risk                  Factors:Former Smoker, Diabetes and Hypertension.  Sonographer:     Rosalia Hammers Referring Phys:  [phone] CHRISTOPHER END Diagnosing Phys: Nelva Bush MD  Sonographer Comments: Image acquisition challenging due to patient body habitus, Image acquisition challenging due to COPD and Image acquisition challenging due to respiratory motion. IMPRESSIONS  1. Left ventricular ejection fraction, by estimation, is 55 to 60%. The left ventricle has normal function. The left ventricle has no regional wall motion abnormalities. There is moderate left ventricular hypertrophy.  2. Right ventricular systolic function is moderately reduced. The right ventricular size is normal. Moderately increased right ventricular wall thickness. There is severely elevated pulmonary artery systolic pressure.  3. Moderate pericardial effusion. The pericardial effusion is circumferential, though the largest fluid collections are inferolateral to the LV and adjacent to the RA. There is no definite evidence of  tamponade physiology, though evaluation is limited without mitral/tricuspid valve Doppler waveforms to assess for respiratory variation.  4. Mild mitral valve regurgitation.  5. Tricuspid valve regurgitation is moderate.  6. The aortic valve has an indeterminant number of cusps. There is moderate calcification of the aortic valve. There is moderate thickening of the aortic valve.  7. The inferior vena cava is dilated in size with <50%  respiratory variability, suggesting right atrial pressure of 15 mmHg. FINDINGS  Left Ventricle: Left ventricular ejection fraction, by estimation, is 55 to 60%. The left ventricle has normal function. The left ventricle has no regional wall motion abnormalities. The left ventricular internal cavity size was normal in size. There is  moderate left ventricular hypertrophy. Right Ventricle: The right ventricular size is normal. Moderately increased right ventricular wall thickness. Right ventricular systolic function is moderately reduced. There is severely elevated pulmonary artery systolic pressure. The tricuspid regurgitant velocity is 3.46 m/s, and with an assumed right atrial pressure of 15 mmHg, the estimated right ventricular systolic pressure is 62.9 mmHg. Pericardium: A moderately sized pericardial effusion is present. The pericardial effusion is circumferential, though the largest fluid collections are inferolateral to the LV and adjacent to the RA. Mitral Valve: Mild mitral valve regurgitation. Tricuspid Valve: The tricuspid valve is normal in structure. Tricuspid valve regurgitation is moderate. Aortic Valve: The aortic valve has an indeterminant number of cusps. There is moderate calcification of the aortic valve. There is moderate thickening of the aortic valve. Pulmonic Valve: The pulmonic valve was normal in structure. Pulmonic valve regurgitation is trivial. No evidence of pulmonic stenosis. Venous: The inferior vena cava is dilated in size with less than 50% respiratory variability, suggesting right atrial pressure of 15 mmHg. IAS/Shunts: No atrial level shunt detected by color flow Doppler. LEFT VENTRICLE PLAX 2D LVIDd:         3.12 cm LVIDs:         2.03 cm LV PW:         1.54 cm LV IVS:        1.46 cm  RIGHT VENTRICLE RV Basal diam:  3.67 cm LEFT ATRIUM         Index LA diam:    4.10 cm 2.09 cm/m  PULMONIC VALVE PV Vmax:          1.08 m/s PV Vmean:         68.300 cm/s PV VTI:           0.142 m PV  Peak grad:     4.7 mmHg PV Mean grad:     2.0 mmHg PR End Diast Vel: 2.50 msec  TRICUSPID VALVE TR Peak grad:   47.9 mmHg TR Vmax:        346.00 cm/s Nelva Bush MD Electronically signed by Nelva Bush MD Signature Date/Time: 08/16/2021/5:30:44 PM    Final (Updated)    DG Chest Port 1 View  Result Date: 08/16/2021 CLINICAL DATA:  Post thoracentesis EXAM: PORTABLE CHEST 1 VIEW COMPARISON:  08/15/2021, CT 08/16/2021 FINDINGS: Decreased left pleural effusion post thoracentesis. No pneumothorax. Cardiomegaly with vascular congestion and hazy edema or atelectasis at the bases. Small residual pleural effusions. IMPRESSION: 1. Decreased left effusion post thoracentesis.  No pneumothorax 2. Cardiomegaly with vascular congestion and at least small residual bilateral effusions. Hazy edema or atelectasis at the bases Electronically Signed   By: Donavan Foil M.D.   On: 08/16/2021 17:20   CT CHEST WO CONTRAST  Result Date: 08/16/2021 CLINICAL DATA:  Pneumonia, CHF EXAM:  CT CHEST WITHOUT CONTRAST TECHNIQUE: Multidetector CT imaging of the chest was performed following the standard protocol without IV contrast. RADIATION DOSE REDUCTION: This exam was performed according to the departmental dose-optimization program which includes automated exposure control, adjustment of the mA and/or kV according to patient size and/or use of iterative reconstruction technique. COMPARISON:  Previous CT done on 02/05/2021 and chest radiograph done on 08/15/2021 FINDINGS: Cardiovascular: There is interval appearance of moderate to large pericardial effusion. There are scattered coronary artery calcifications. Mediastinum/Nodes: There are slightly enlarged lymph nodes in the mediastinum with no significant interval change. Lungs/Pleura: There is interval decrease in size of moderate right pleural effusion. There is large left pleural effusion with significant interval increase. Infiltrates are seen in the posterior aspect of left mid  and both lower lung fields, more so on the left side suggesting compression atelectasis and possibly pneumonia. There is no pneumothorax. Upper Abdomen: Unremarkable. Musculoskeletal: Unremarkable. IMPRESSION: There is interval appearance of moderate to large pericardial effusion. Coronary artery calcifications are seen. Moderate to large bilateral pleural effusions, more so on the left side. There is significant interval increase in amount of left pleural effusion. Infiltrates are noted in the posterior left mid and both lower lung fields, more so on the left side suggesting compression atelectasis and possibly underlying pneumonia. Electronically Signed   By: Elmer Picker M.D.   On: 08/16/2021 15:24    Cardiac Studies   TTE 08/16/2021 1. Left ventricular ejection fraction, by estimation, is 55 to 60%. The  left ventricle has normal function. The left ventricle has no regional  wall motion abnormalities. There is moderate left ventricular hypertrophy.   2. Right ventricular systolic function is moderately reduced. The right  ventricular size is normal. Moderately increased right ventricular wall  thickness. There is severely elevated pulmonary artery systolic pressure.   3. Moderate pericardial effusion. The pericardial effusion is  circumferential, though the largest fluid collections are inferolateral to  the LV and adjacent to the RA. There is no definite evidence of tamponade  physiology, though evaluation is limited  without mitral/tricuspid valve Doppler waveforms to assess for respiratory  variation.   4. Mild mitral valve regurgitation.   5. Tricuspid valve regurgitation is moderate.   6. The aortic valve has an indeterminant number of cusps. There is  moderate calcification of the aortic valve. There is moderate thickening  of the aortic valve.   7. The inferior vena cava is dilated in size with <50% respiratory  variability, suggesting right atrial pressure of 15 mmHg.    Patient Profile     76 y.o. female with history of HFpEF, COPD, obesity, respiratory failure, oxygen presenting with shortness of breath and edema, diagnosed with pneumonia and respiratory failure being seen for A-fib RVR and HFpEF  Assessment & Plan    HFpEF -Edema improving, breathing improving -Net -1.3 L over the past 24 hours -Continue IV Lasix 40 mg twice daily, Aldactone 12.5 mg daily -Transition to PTA torsemide in 1 to 2 days  2.  Mild to moderate pericardial effusion -Continue diuresis as above -Holding Eliquis to prevent/reduce risk of hemorrhagic conversion -Repeat limited echo tomorrow, if stable or reduced, restart Eliquis  3.  Persistent A-fib -Heart rate controlled -Cardizem 60 mg twice daily.  Eliquis on hold as above.  4.  COPD, respiratory failure, pneumonia -Antibiotics, management as per medicine team  Total encounter time more than 50 minutes  Greater than 50% was spent in counseling and coordination of care with the patient  Signed, Kate Sable, MD  08/17/2021, 4:03 PM

## 2021-08-17 NOTE — Progress Notes (Addendum)
Daily Progress Note   Patient Name: Karen [name]       Date: 08/17/2021 DOB: March 05, 1946  Age: 76 y.o. MRN#: [id number] Attending Physician: Wyvonnia Dusky, MD Primary Care Physician: Margarita Rana, MD Admit Date: 08/14/2021  Reason for Consultation/Follow-up: Establishing goals of care  Subjective: Notes, labs, and diagnostics reviewed. In to see patient. Husband is at bedside. Husband is quite attentive and answers all questions asked of his wife except one.   He discusses the move back and forth from hospital to rehab, and is able to articulate the reasons for admission. He states she needs rehab as he is unable to provide care for her at home otherwise. She uses a walker and 3 lpm of O2 at baseline.   He states medicaid did provide funding for a paid caregiver at home, but this has not been arranged. He states she does well at rehab usually, and enjoys it, she says "yes" that she agrees with this. He discusses that home health PT had came to the house previously but is not useful so he stopped the service. He states she is able to walk through the house as needed with a walker, and this was home PT's only goal for her. He tells me he does all chores around the house including cooking and cleaning.   Discussed her diagnoses. Discussed current heated high flow settings. He states a CPAP/BIPAP has been ordered for when she goes home to use at night. Discussed her thoracentesis, and that this is the first of this procedure she has had.   He discusses her care in and outpatient, and voices frustration with attempts of providers at changing medications and treatments, yet she continues to require hospitalization.  Conversation ended as patient stated she was tired.    Length of Stay:  3  Current Medications: Scheduled Meds:   allopurinol  100 mg Oral Daily   atorvastatin  10 mg Oral Daily   budesonide (PULMICORT) nebulizer solution  0.25 mg Nebulization BID   chlorhexidine  15 mL Mouth Rinse BID   diltiazem  60 mg Oral BID   empagliflozin  10 mg Oral Daily   ferrous sulfate  325 mg Oral Q breakfast   fluticasone  2 spray Each Nare Daily   furosemide  40 mg Intravenous BID  insulin aspart  0-20 Units Subcutaneous TID WC   insulin aspart  10 Units Subcutaneous TID WC   insulin glargine-yfgn  25 Units Subcutaneous BID   ipratropium  2 spray Each Nare QID   lidocaine  1 patch Transdermal Q24H   mouth rinse  15 mL Mouth Rinse q12n4p   melatonin  5 mg Oral QHS   montelukast  10 mg Oral QHS   pantoprazole  40 mg Oral Daily   spironolactone  12.5 mg Oral Daily   umeclidinium-vilanterol  1 puff Inhalation Daily   vitamin B-12  500 mcg Oral Daily    Continuous Infusions:  ceFEPime (MAXIPIME) IV Stopped (08/17/21 1256)    PRN Meds: acetaminophen, albuterol, ipratropium-albuterol, oxyCODONE, senna-docusate, traZODone  Physical Exam Pulmonary:     Comments: On high flow.  Neurological:     Mental Status: She is alert.             Vital Signs: BP (!) 109/59 (BP Location: Left Arm)   Pulse 98   Temp 97.7 F (36.5 C) (Oral)   Resp (!) 27   Wt 93 kg   SpO2 98%   BMI 36.32 kg/m  SpO2: SpO2: 98 % O2 Device: O2 Device: High Flow Nasal Cannula (heated high flow) O2 Flow Rate: O2 Flow Rate (L/min): 45 L/min  Intake/output summary:  Intake/Output Summary (Last 24 hours) at 08/17/2021 1257 Last data filed at 08/17/2021 1256 Gross per 24 hour  Intake 1160 ml  Output 2450 ml  Net -1290 ml   LBM: Last BM Date : 08/15/21 Baseline Weight: Weight: 93.5 kg Most recent weight: Weight: 93 kg          Patient Active Problem List   Diagnosis Date Noted   Pericardial effusion    Permanent atrial fibrillation (HCC)    Sepsis due to pneumonia ([place]) 08/14/2021    HAP (hospital-acquired pneumonia) 08/14/2021   Severe pulmonary hypertension ([place]) 05/29/2021   OSA (obstructive sleep apnea) 05/29/2021   Elevated troponin 05/28/2021   Lower abdominal pain 05/28/2021   Nausea and vomiting 05/28/2021   Prolonged QT interval 05/28/2021   At risk for fall due to comorbid condition 05/02/2021   Pleural effusion    Acute kidney injury superimposed on CKD (HCC)    Impaired gait and mobility 12/20/2020   Pulmonary nodules/lesions, multiple 10/29/2020   Anemia in chronic kidney disease 10/29/2020   Aortic stenosis, moderate    AF (paroxysmal atrial fibrillation) (HCC)    Gastroesophageal reflux disease without esophagitis    Chronic respiratory failure with hypercapnia ([place]) 05/21/2020   Severe sepsis ([place]) 05/21/2020   UTI (urinary tract infection) 05/21/2020   Hyperglycemia due to type 2 diabetes mellitus ([place]) 05/21/2020   Abnormal CT of liver 05/21/2020   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 05/21/2020   Morbid obesity ([place]) 05/08/2020   Athscl 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) 09/27/2019   Hypokalemia 06/28/2019   Hyponatremia 06/28/2019   Uncontrolled type 2 diabetes mellitus with hyperglycemia, with long-term current use of insulin ([place]) 06/28/2019   CKD (chronic kidney disease), stage IIIa 06/28/2019   Atrial fibrillation, chronic ([place]) 06/28/2019   Pulmonary edema 02/27/2019   Bradycardia 02/24/2019   Acute on chronic diastolic  CHF (congestive heart failure) ([place]) 02/23/2019   Chronic respiratory failure with hypoxia ([place]) 02/23/2019   Chest pain 02/13/2019   Osteopenia of neck of left femur 11/20/2018   AVM (arteriovenous malformation) of  small bowel, acquired    Acute gastric ulcer with hemorrhage    Chronic diastolic heart failure ([place]) 08/22/2018   Diarrhea 08/22/2018   Junctional bradycardia    Acute on chronic heart failure with preserved ejection fraction (HFpEF) ([place])    AKI (acute kidney injury) ([place])    Symptomatic bradycardia 08/05/2018   Acute on chronic respiratory failure ([place]) 06/25/2018   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 diabetes mellitus ([place]) 01/25/2018   History of non anemic vitamin B12 deficiency 01/25/2018   Personal history of kidney stones 11/12/2017   Urge incontinence 11/12/2017   History of leukocytosis 09/17/2017   GI bleed 08/26/2017   Arthritis 08/10/2017   Stage 4 chronic kidney disease ([place]) 08/10/2017   COPD (chronic obstructive pulmonary disease) ([place]) 08/10/2017   Diabetes mellitus type 2, uncomplicated ([place]) 25/95/6387   Hypertension 08/10/2017   Obesity (BMI 35.0-39.9 without comorbidity) 04/11/2017   Primary osteoarthritis of right knee 09/01/2016   Leucocytosis 10/19/2015   Asterixis 01/07/2015   Acute on chronic respiratory failure with hypoxia and hypercapnia (HCC) 01/07/2015   Sciatica 01/07/2015   Weakness 01/07/2015   Chronic 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 01/04/2015   Iron deficiency anemia 06/22/2014   Microalbuminuria 06/22/2014   CHF (congestive heart failure) ([place]) 04/20/2014   Edema, peripheral 04/20/2014    Palliative Care Assessment & Plan     Recommendations/Plan: Continue current care.   Code Status:    Code Status Orders  (From admission, onward)           Start     Ordered   08/14/21 1844  Do not attempt resuscitation (DNR)  Continuous       Question Answer Comment  In the event of cardiac or respiratory ARREST Do not call a "code blue"   In the event of cardiac or respiratory ARREST Do not perform Intubation, CPR, defibrillation or ACLS   In the event of cardiac or respiratory ARREST Use medication by any  route, position, wound care, and other measures to relive pain and suffering. May use oxygen, suction and manual treatment of airway obstruction as needed for comfort.      08/14/21 1849           Code Status History     Date Active Date Inactive Code Status Order ID Comments User Context   08/03/2021 1714 08/10/2021 2302 DNR [id number]  Leslee Home, DO ED   05/28/2021 1927 06/02/2021 2335 DNR [id number]  Norval Morton, MD ED   05/28/2021 1851 05/28/2021 1927 Full Code [id number]  Norval Morton, MD ED   02/03/2021 1805 02/12/2021 1902 DNR [id number]  Mercy Riding, MD Inpatient   02/01/2021 1653 02/03/2021 1805 Full Code [id number]  Collier Bullock, MD ED   10/29/2020 1650 11/09/2020 1747 Full Code [id number]  Cox, [place], DO ED   06/17/2020 2215 06/21/2020 2140 Full Code [id number]  Mansy, Arvella Merles, MD ED   05/21/2020 2251 05/25/2020 1950 Full Code [id number]  Athena Masse, MD Inpatient   09/28/2019 0120 10/02/2019 2116 DNR [id number]  Sueanne Margarita, [place] ED   06/28/2019 1333 06/30/2019 1619 DNR [id number]  Ivor Costa, MD Inpatient   06/28/2019 1255 06/28/2019 1333 DNR [id number]  Ivor Costa, MD Inpatient   02/23/2019 1938 02/27/2019 1810 Full  Code [id number]  Edwin Dada, MD Inpatient   02/13/2019 2011 02/16/2019 1819 DNR [id number]  Nicolette Bang, DO ED   09/14/2018 2220 09/17/2018 2350 DNR [id number]  Henreitta Leber, MD Inpatient   08/05/2018 1547 08/10/2018 1949 Full Code [id number]  Gladstone Lighter, MD Inpatient   06/25/2018 1417 06/30/2018 1557 Full Code [id number]  Epifanio Lesches, MD ED   09/15/2017 1747 09/18/2017 2106 DNR [id number]  Hillary Bow, MD ED   08/26/2017 2216 08/30/2017 1604 Full Code [id number]  Nicholes Mango, MD Inpatient   01/08/2015 0307 01/09/2015 1336 Full Code [id number]  Theodoro Grist, MD Inpatient      Advance Directive Documentation    Flowsheet Row Most Recent Value  Type of Advance Directive Out of facility DNR (pink MOST or yellow form)  Pre-existing out  of facility DNR order (yellow form or pink MOST form) --  "MOST" Form in Place? --       Thank you for allowing the Palliative Medicine Team to assist in the care of this patient.  Asencion Gowda, NP  Please contact Palliative Medicine Team phone at [phone] for questions and concerns.

## 2021-08-17 NOTE — Progress Notes (Signed)
PROGRESS NOTE    Karen [name]  BSW:[id number] DOB: 09/23/1945 DOA: 08/14/2021 PCP: Margarita Rana, MD    Assessment & Plan:   Principal Problem:   HAP (hospital-acquired pneumonia) Active Problems:   Acute on chronic heart failure with preserved ejection fraction (HFpEF) (HCC)   Hyponatremia   Sepsis due to pneumonia ([place])   Chronic respiratory failure with hypoxia (HCC)   Hypokalemia   Uncontrolled type 2 diabetes mellitus with hyperglycemia, with long-term current use of insulin (HCC)   AF (paroxysmal atrial fibrillation) (HCC)   Acute kidney injury superimposed on CKD (HCC)   OSA (obstructive sleep apnea)   COPD (chronic obstructive pulmonary disease) (HCC)   Chest pain   Gastroesophageal reflux disease without esophagitis   Pericardial effusion   Permanent atrial fibrillation ([place])  Assessment and Plan: Sepsis: secondary to hospital-acquired pneumonia. Was recently hospitalized here and was discharged to skilled nursing facility on 08/10/2021. Continue on IV cefepime.    Acute on chronic hypoxic respiratory failure: chronically on 3 L but currently on HFNC . Has hx of CHF, COPD, OSA, pulmonary hypertension. BiPAP prn.     Acute on chronic diastolic CHF: echo done on 08/04/2021 showed normal EF, grade III diastolic dysfunction, TR. Continue on IV lasix. Monitor I/Os   COPD exacerbation: continue on bronchodilators & supplemental oxygen   PAF:  IV cardizem was d/c. Continue on po cardizem. Hold eliquis  B/l pleural effusions: s/p L thoracentesis w/ 879mL of clear pleural fluid removed on 08/16/21.   Pericardiac effusion: w/o cardiac tamponade physiology as per cardio. Management as per cardrio    Chronic hyponatremia: likely contributed to by hypervolemic hyponatremia from CHF and also hyperglycemia. Trending up   DM2: poorly controlled. Continue on glargine, aspart & SSI w/ accuchecks    Hypokalemia: WNL today    OSA: CPAP qhs    GERD: continue on PPI    Chest  pain:  EKG showed sinus tachycardia.  Chest wall was tender on palpitation.  Most likely atypical chest pain.  Troponins are negative. No chest pain today    AKI on CKD stage IIIa: Cr is back to baseline. Avoid nephrotoxic meds    Morbid obesity: BMI 36.3. Complicates overall care & prognosis       DVT prophylaxis: SCDs Code Status:  DNR Family Communication: discussed pt's care w/ pt's care w/ pt's husband, Ray, and answered his questions  Disposition Plan: depends on PT/OT recs   Level of care: Progressive  Status is: Inpatient Remains inpatient appropriate because: severity of illness, still on HFNC     Consultants:  Cardio  Palliative care   Procedures:   Antimicrobials: cefepime   Subjective: Pt c/o shortness of breath, improved from day prior   Objective: Vitals:   08/16/21 2340 08/17/21 0415 08/17/21 0500 08/17/21 0600  BP: 101/60 (!) 104/51    Pulse: 88 74  76  Resp: 20 (!) 24  (!) 24  Temp: (!) 97.4 F (36.3 C) (!) 97.5 F (36.4 C)    TempSrc: Axillary Axillary    SpO2: 92% 97%  94%  Weight:   93 kg     Intake/Output Summary (Last 24 hours) at 08/17/2021 0723 Last data filed at 08/17/2021 0052 Gross per 24 hour  Intake 920 ml  Output 2250 ml  Net -1330 ml   Filed Weights   08/14/21 1452 08/17/21 0500  Weight: 93.5 kg 93 kg    Examination:  General exam: Appears calm and comfortable  Respiratory system: diminished  breath sounds b/l  Cardiovascular system: irregularly irregular. No rubs, gallops or clicks.  Gastrointestinal system: Abdomen is obese, soft and nontender. Normal bowel sounds heard. Central nervous system: Alert and oriented. Moves all extremities  Psychiatry: Judgement and insight appear normal. Flat mood and affect.     Data Reviewed: I have personally reviewed following labs and imaging studies  CBC: Recent Labs  Lab 08/14/21 1518 08/15/21 0558 08/16/21 0525 08/17/21 0632  WBC 25.1* 23.6* 25.1* 14.0*  NEUTROABS  22.1*  --   --   --   HGB 11.0* 10.7* 11.1* 10.4*  HCT 33.6* 32.1* 34.3* 32.7*  MCV 86.4 85.1 87.3 88.1  PLT 346 291 371 353   Basic Metabolic Panel: Recent Labs  Lab 08/14/21 1518 08/15/21 0558 08/15/21 1231 08/16/21 0525 08/17/21 0632  NA 119* 124*  --  130* 133*  K 4.2 4.2  --  3.4* 3.7  CL 80* 85*  --  90* 93*  CO2 25 27  --  30 32  GLUCOSE 458* 347* 401* 151* 179*  BUN 69* 58*  --  44* 46*  CREATININE 1.81* 1.15*  --  1.04* 1.01*  CALCIUM 8.9 8.9  --  9.0 8.8*   GFR: Estimated Creatinine Clearance: 50.5 mL/min (A) (by C-G formula based on SCr of 1.01 mg/dL (H)). Liver Function Tests: Recent Labs  Lab 08/14/21 1518  AST 24  ALT 20  ALKPHOS 91  BILITOT 0.9  PROT 7.1  ALBUMIN 3.1*   No results for input(s): "LIPASE", "AMYLASE" in the last 168 hours. No results for input(s): "AMMONIA" in the last 168 hours. Coagulation Profile: No results for input(s): "INR", "PROTIME" in the last 168 hours. Cardiac Enzymes: No results for input(s): "CKTOTAL", "CKMB", "CKMBINDEX", "TROPONINI" in the last 168 hours. BNP (last 3 results) No results for input(s): "PROBNP" in the last 8760 hours. HbA1C: No results for input(s): "HGBA1C" in the last 72 hours. CBG: Recent Labs  Lab 08/15/21 2020 08/16/21 0759 08/16/21 1153 08/16/21 1534 08/16/21 2026  GLUCAP 217* 208* 234* 305* 188*   Lipid Profile: No results for input(s): "CHOL", "HDL", "LDLCALC", "TRIG", "CHOLHDL", "LDLDIRECT" in the last 72 hours. Thyroid Function Tests: No results for input(s): "TSH", "T4TOTAL", "FREET4", "T3FREE", "THYROIDAB" in the last 72 hours. Anemia Panel: No results for input(s): "VITAMINB12", "FOLATE", "FERRITIN", "TIBC", "IRON", "RETICCTPCT" in the last 72 hours. Sepsis Labs: Recent Labs  Lab 08/14/21 1653 08/14/21 1910  PROCALCITON 0.31  --   LATICACIDVEN 2.5* 2.2*    Recent Results (from the past 240 hour(s))  Blood culture (routine x 2)     Status: None (Preliminary result)    Collection Time: 08/14/21  4:53 PM   Specimen: BLOOD  Result Value Ref Range Status   Specimen Description BLOOD [facility]  Final   Special Requests BOTTLES DRAWN AEROBIC AND ANAEROBIC BCLV  Final   Culture   Final    NO GROWTH 2 DAYS Performed at [facility], [address] [place], [place] [address]    Report Status PENDING  Incomplete  Blood culture (routine x 2)     Status: None (Preliminary result)   Collection Time: 08/14/21  4:55 PM   Specimen: BLOOD  Result Value Ref Range Status   Specimen Description BLOOD RIGHT ANTECUBITAL  Final   Special Requests   Final    BOTTLES DRAWN AEROBIC AND ANAEROBIC Blood Culture adequate volume   Culture   Final    NO GROWTH 2 DAYS Performed at [facility], [place]  [address], [place], [place] [address]    Report Status PENDING  Incomplete  MRSA Next Gen by PCR, Nasal     Status: None   Collection Time: 08/15/21  5:50 PM   Specimen: Nasal Mucosa; Nasal Swab  Result Value Ref Range Status   MRSA by PCR Next Gen NOT DETECTED NOT DETECTED Final    Comment: (NOTE) The GeneXpert MRSA Assay (FDA approved for NASAL specimens only), is one component of a comprehensive MRSA colonization surveillance program. It is not intended to diagnose MRSA infection nor to guide or monitor treatment for MRSA infections. Test performance is not FDA approved in patients less than 17 years old. Performed at [facility], [address] [place], [place] [address]   Body fluid culture w Gram Stain     Status: None (Preliminary result)   Collection Time: 08/16/21  4:20 PM   Specimen: PATH Cytology Pleural fluid  Result Value Ref Range Status   Specimen Description   Final    PLEURAL Performed at [facility], [address] [place], [place] [address]    Special Requests   Final    PLEURAL Performed at [facility], [address] [place], [place] [address]    Gram Stain   Final    CYTOSPIN SMEAR WBC  PRESENT,BOTH PMN AND MONONUCLEAR NO ORGANISMS SEEN Performed at [facility], [address], [place], [place] [address]    Culture PENDING  Incomplete   Report Status PENDING  Incomplete         Radiology Studies: ECHOCARDIOGRAM LIMITED  Result Date: 08/16/2021    ECHOCARDIOGRAM LIMITED REPORT   Patient Name:   JASLIN NOVITSKI Date of Exam: 08/16/2021 Medical Rec #:  [id number]        Height:       63.0 in Accession #:    [id number]       Weight:       206.1 lb Date of Birth:  September 09, 1945       BSA:          1.959 m Patient Age:    89 years         BP:           104/63 mmHg Patient Gender: F                HR:           91 bpm. Exam Location:  [facility] Procedure: Limited Color Doppler and Limited Echo Indications:     I31.3 Pericardial effusion  History:         Patient has prior history of Echocardiogram examinations, most                  recent 08/04/2021. CHF, COPD and Pulmonary HTN; Risk                  Factors:Former Smoker, Diabetes and Hypertension.  Sonographer:     Rosalia Hammers Referring Phys:  [phone] CHRISTOPHER END Diagnosing Phys: Nelva Bush MD  Sonographer Comments: Image acquisition challenging due to patient body habitus, Image acquisition challenging due to COPD and Image acquisition challenging due to respiratory motion. IMPRESSIONS  1. Left ventricular ejection fraction, by estimation, is 55 to 60%. The left ventricle has normal function. The left ventricle has no regional wall motion abnormalities. There is moderate left ventricular hypertrophy.  2. Right ventricular systolic function is moderately reduced. The right ventricular size is normal. Moderately increased right ventricular wall  thickness. There is severely elevated pulmonary artery systolic pressure.  3. Moderate pericardial effusion. The pericardial effusion is circumferential, though the largest fluid collections are inferolateral to the LV and adjacent to the RA. There is no definite evidence of tamponade  physiology, though evaluation is limited without mitral/tricuspid valve Doppler waveforms to assess for respiratory variation.  4. Mild mitral valve regurgitation.  5. Tricuspid valve regurgitation is moderate.  6. The aortic valve has an indeterminant number of cusps. There is moderate calcification of the aortic valve. There is moderate thickening of the aortic valve.  7. The inferior vena cava is dilated in size with <50% respiratory variability, suggesting right atrial pressure of 15 mmHg. FINDINGS  Left Ventricle: Left ventricular ejection fraction, by estimation, is 55 to 60%. The left ventricle has normal function. The left ventricle has no regional wall motion abnormalities. The left ventricular internal cavity size was normal in size. There is  moderate left ventricular hypertrophy. Right Ventricle: The right ventricular size is normal. Moderately increased right ventricular wall thickness. Right ventricular systolic function is moderately reduced. There is severely elevated pulmonary artery systolic pressure. The tricuspid regurgitant velocity is 3.46 m/s, and with an assumed right atrial pressure of 15 mmHg, the estimated right ventricular systolic pressure is 97.6 mmHg. Pericardium: A moderately sized pericardial effusion is present. The pericardial effusion is circumferential, though the largest fluid collections are inferolateral to the LV and adjacent to the RA. Mitral Valve: Mild mitral valve regurgitation. Tricuspid Valve: The tricuspid valve is normal in structure. Tricuspid valve regurgitation is moderate. Aortic Valve: The aortic valve has an indeterminant number of cusps. There is moderate calcification of the aortic valve. There is moderate thickening of the aortic valve. Pulmonic Valve: The pulmonic valve was normal in structure. Pulmonic valve regurgitation is trivial. No evidence of pulmonic stenosis. Venous: The inferior vena cava is dilated in size with less than 50% respiratory  variability, suggesting right atrial pressure of 15 mmHg. IAS/Shunts: No atrial level shunt detected by color flow Doppler. LEFT VENTRICLE PLAX 2D LVIDd:         3.12 cm LVIDs:         2.03 cm LV PW:         1.54 cm LV IVS:        1.46 cm  RIGHT VENTRICLE RV Basal diam:  3.67 cm LEFT ATRIUM         Index LA diam:    4.10 cm 2.09 cm/m  PULMONIC VALVE PV Vmax:          1.08 m/s PV Vmean:         68.300 cm/s PV VTI:           0.142 m PV Peak grad:     4.7 mmHg PV Mean grad:     2.0 mmHg PR End Diast Vel: 2.50 msec  TRICUSPID VALVE TR Peak grad:   47.9 mmHg TR Vmax:        346.00 cm/s Nelva Bush MD Electronically signed by Nelva Bush MD Signature Date/Time: 08/16/2021/5:30:44 PM    Final (Updated)    DG Chest Port 1 View  Result Date: 08/16/2021 CLINICAL DATA:  Post thoracentesis EXAM: PORTABLE CHEST 1 VIEW COMPARISON:  08/15/2021, CT 08/16/2021 FINDINGS: Decreased left pleural effusion post thoracentesis. No pneumothorax. Cardiomegaly with vascular congestion and hazy edema or atelectasis at the bases. Small residual pleural effusions. IMPRESSION: 1. Decreased left effusion post thoracentesis.  No pneumothorax 2. Cardiomegaly with vascular congestion and at least small  residual bilateral effusions. Hazy edema or atelectasis at the bases Electronically Signed   By: Donavan Foil M.D.   On: 08/16/2021 17:20   CT CHEST WO CONTRAST  Result Date: 08/16/2021 CLINICAL DATA:  Pneumonia, CHF EXAM: CT CHEST WITHOUT CONTRAST TECHNIQUE: Multidetector CT imaging of the chest was performed following the standard protocol without IV contrast. RADIATION DOSE REDUCTION: This exam was performed according to the departmental dose-optimization program which includes automated exposure control, adjustment of the mA and/or kV according to patient size and/or use of iterative reconstruction technique. COMPARISON:  Previous CT done on 02/05/2021 and chest radiograph done on 08/15/2021 FINDINGS: Cardiovascular: There is  interval appearance of moderate to large pericardial effusion. There are scattered coronary artery calcifications. Mediastinum/Nodes: There are slightly enlarged lymph nodes in the mediastinum with no significant interval change. Lungs/Pleura: There is interval decrease in size of moderate right pleural effusion. There is large left pleural effusion with significant interval increase. Infiltrates are seen in the posterior aspect of left mid and both lower lung fields, more so on the left side suggesting compression atelectasis and possibly pneumonia. There is no pneumothorax. Upper Abdomen: Unremarkable. Musculoskeletal: Unremarkable. IMPRESSION: There is interval appearance of moderate to large pericardial effusion. Coronary artery calcifications are seen. Moderate to large bilateral pleural effusions, more so on the left side. There is significant interval increase in amount of left pleural effusion. Infiltrates are noted in the posterior left mid and both lower lung fields, more so on the left side suggesting compression atelectasis and possibly underlying pneumonia. Electronically Signed   By: Elmer Picker M.D.   On: 08/16/2021 15:24   DG Chest Port 1 View  Result Date: 08/15/2021 CLINICAL DATA:  Dyspnea. EXAM: PORTABLE CHEST 1 VIEW COMPARISON:  08/14/2021 FINDINGS: Stable marked cardiomegaly. Stable pulmonary vascular congestion. Persistent opacification of left retrocardiac lung base is seen which may be due to atelectasis or pneumonia. IMPRESSION: Stable cardiomegaly and pulmonary vascular congestion. Persistent left retrocardiac atelectasis versus pneumonia. Electronically Signed   By: Marlaine Hind M.D.   On: 08/15/2021 12:57        Scheduled Meds:  allopurinol  100 mg Oral Daily   atorvastatin  10 mg Oral Daily   budesonide (PULMICORT) nebulizer solution  0.25 mg Nebulization BID   diltiazem  60 mg Oral BID   empagliflozin  10 mg Oral Daily   ferrous sulfate  325 mg Oral Q breakfast    fluticasone  2 spray Each Nare Daily   furosemide  40 mg Intravenous BID   insulin aspart  0-20 Units Subcutaneous TID WC   insulin aspart  10 Units Subcutaneous TID WC   insulin glargine-yfgn  25 Units Subcutaneous BID   ipratropium  2 spray Each Nare QID   lidocaine  1 patch Transdermal Q24H   melatonin  5 mg Oral QHS   montelukast  10 mg Oral QHS   pantoprazole  40 mg Oral Daily   spironolactone  12.5 mg Oral Daily   umeclidinium-vilanterol  1 puff Inhalation Daily   vitamin B-12  500 mcg Oral Daily   Continuous Infusions:  ceFEPime (MAXIPIME) IV Stopped (08/17/21 5681)     LOS: 3 days    Time spent: 33 mins     Wyvonnia Dusky, MD [facility] Pager [phone]  If 7PM-7AM, please contact night-coverage 08/17/2021, 7:23 AM

## 2021-08-18 DIAGNOSIS — J432 Centrilobular emphysema: Secondary | ICD-10-CM | POA: Diagnosis not present

## 2021-08-18 DIAGNOSIS — J9621 Acute and chronic respiratory failure with hypoxia: Secondary | ICD-10-CM | POA: Diagnosis not present

## 2021-08-18 DIAGNOSIS — Z7189 Other specified counseling: Secondary | ICD-10-CM | POA: Diagnosis not present

## 2021-08-18 DIAGNOSIS — I208 Other forms of angina pectoris: Secondary | ICD-10-CM

## 2021-08-18 DIAGNOSIS — I5033 Acute on chronic diastolic (congestive) heart failure: Secondary | ICD-10-CM | POA: Diagnosis not present

## 2021-08-18 DIAGNOSIS — E1165 Type 2 diabetes mellitus with hyperglycemia: Secondary | ICD-10-CM

## 2021-08-18 DIAGNOSIS — I480 Paroxysmal atrial fibrillation: Secondary | ICD-10-CM | POA: Diagnosis not present

## 2021-08-18 DIAGNOSIS — J189 Pneumonia, unspecified organism: Secondary | ICD-10-CM | POA: Diagnosis not present

## 2021-08-18 DIAGNOSIS — A419 Sepsis, unspecified organism: Secondary | ICD-10-CM

## 2021-08-18 DIAGNOSIS — Z794 Long term (current) use of insulin: Secondary | ICD-10-CM

## 2021-08-18 DIAGNOSIS — Y95 Nosocomial condition: Secondary | ICD-10-CM | POA: Diagnosis not present

## 2021-08-18 LAB — GLUCOSE, CAPILLARY
Glucose-Capillary: 154 mg/dL — ABNORMAL HIGH (ref 70–99)
Glucose-Capillary: 206 mg/dL — ABNORMAL HIGH (ref 70–99)
Glucose-Capillary: 179 mg/dL — ABNORMAL HIGH (ref 70–99)
Glucose-Capillary: 191 mg/dL — ABNORMAL HIGH (ref 70–99)

## 2021-08-18 LAB — BASIC METABOLIC PANEL
Anion gap: 9 (ref 5–15)
BUN: 39 mg/dL — ABNORMAL HIGH (ref 8–23)
CO2: 33 mmol/L — ABNORMAL HIGH (ref 22–32)
Calcium: 8.6 mg/dL — ABNORMAL LOW (ref 8.9–10.3)
Chloride: 93 mmol/L — ABNORMAL LOW (ref 98–111)
Creatinine, Ser: 0.8 mg/dL (ref 0.44–1.00)
GFR, Estimated: >60 mL/min (ref >60)
Glucose, Bld: 142 mg/dL — ABNORMAL HIGH (ref 70–99)
Potassium: 3.4 mmol/L — ABNORMAL LOW (ref 3.5–5.1)
Sodium: 135 mmol/L (ref 135–145)

## 2021-08-18 LAB — CBC
HCT: 33 % — ABNORMAL LOW (ref 36.0–46.0)
Hemoglobin: 10.6 g/dL — ABNORMAL LOW (ref 12.0–15.0)
MCH: 28.6 pg (ref 26.0–34.0)
MCHC: 32.1 g/dL (ref 30.0–36.0)
MCV: 88.9 fL (ref 80.0–100.0)
Platelets: 367 10*3/uL (ref 150–400)
RBC: 3.71 MIL/uL — ABNORMAL LOW (ref 3.87–5.11)
RDW: 14.8 % (ref 11.5–15.5)
WBC: 17.2 10*3/uL — ABNORMAL HIGH (ref 4.0–10.5)
nRBC: 0 % (ref 0.0–0.2)

## 2021-08-18 MED ORDER — OXYCODONE HCL 5 MG PO TABS
5.0000 mg | ORAL_TABLET | Freq: Four times a day (QID) | ORAL | Status: DC | PRN
  Administered 2021-08-18 – 2021-09-07 (×43): 5 mg via ORAL
  Filled 2021-08-18 (×43): qty 1

## 2021-08-18 MED ORDER — POTASSIUM CHLORIDE CRYS ER 20 MEQ PO TBCR
20.0000 meq | EXTENDED_RELEASE_TABLET | Freq: Once | ORAL | Status: AC
Start: 2021-08-18 — End: 2021-08-18
  Administered 2021-08-18: 20 meq via ORAL
  Filled 2021-08-18: qty 1

## 2021-08-18 MED ORDER — OXYCODONE HCL 5 MG PO TABS
2.5000 mg | ORAL_TABLET | Freq: Four times a day (QID) | ORAL | Status: DC | PRN
  Administered 2021-08-18: 2.5 mg via ORAL
  Filled 2021-08-18: qty 1

## 2021-08-18 NOTE — Progress Notes (Addendum)
Daily Progress Note   Patient Name: Karen [name]       Date: 08/18/2021 DOB: 10-15-1945  Age: 76 y.o. MRN#: [id number] Attending Physician: Wyvonnia Dusky, MD Primary Care Physician: Margarita Rana, MD Admit Date: 08/14/2021  Reason for Consultation/Follow-up: Establishing goals of care  This note has been locked at patient request, and contents will not be discussed with family.    Subjective: Notes and labs including CBC,BMP, and glucose levels reviewed. Patient is sitting in bed on high flow cannula. No family at bedside. She states she feels horrible, and quite weak. Upon attempting to follow up from yesterday, and mentioning our conversation from yesterday she rolled her eyes and shook her head. She voices frustration with her husband speaking for her.   Discussed that she can speak freely about her wishes and my note can be locked from public view. She states she would like to be at home, but does not feel she has the support she needs. In discussing support at home, she understands from conversation yesterday husband stated he fired her home health PT/OT previously; she shook her head appearing frustrated when discussing this. She states she has children, but they do not live in the area and are of no physical support at home. She states she likes rehab because "they help me get better".   We discussed her diagnoses, prognosis, and GOC.   Created space and opportunity for patient  to explore thoughts and feelings regarding current medical information.   A detailed discussion was had today regarding advanced directives.  Concepts specific to code status, artifical feeding and hydration, IV antibiotics and rehospitalization were discussed.  The difference between an aggressive  medical intervention path and a comfort care path was discussed.  Values and goals of care important to patient and family were attempted to be elicited.  Discussed limitations of medical interventions to prolong quality of life in some situations and discussed the concept of human mortality.  She confirms DNR/DNI as her personal wishes. She voices understanding she cannot leave the hospital on heated high flow as she is now; she states she would not want to be on permanent high flow even if she could be. She states she does want to continue current care, and take one day at a time to see if she  can improve. She tells me she would want to go to rehab if able, and that she desires to make her own health decisions.   Discussed that if anyone enters the room to speak with her, she can ask them to come back another time, or to return later to talk to her when she is alone, she is grateful for this. She is clear, in regards to her children, she would want her husband to communicate any information to them himself.     PMT will follow.      Length of Stay: 4  Current Medications: Scheduled Meds:   allopurinol  100 mg Oral Daily   atorvastatin  10 mg Oral Daily   budesonide (PULMICORT) nebulizer solution  0.25 mg Nebulization BID   chlorhexidine  15 mL Mouth Rinse BID   diltiazem  60 mg Oral BID   empagliflozin  10 mg Oral Daily   ferrous sulfate  325 mg Oral Q breakfast   fluticasone  2 spray Each Nare Daily   furosemide  40 mg Intravenous BID   insulin aspart  0-20 Units Subcutaneous TID WC   insulin aspart  10 Units Subcutaneous TID WC   insulin glargine-yfgn  25 Units Subcutaneous BID   ipratropium  2 spray Each Nare QID   lidocaine  1 patch Transdermal Q24H   mouth rinse  15 mL Mouth Rinse q12n4p   melatonin  5 mg Oral QHS   montelukast  10 mg Oral QHS   pantoprazole  40 mg Oral Daily   spironolactone  12.5 mg Oral Daily   umeclidinium-vilanterol  1 puff Inhalation Daily   vitamin  B-12  500 mcg Oral Daily    Continuous Infusions:  ceFEPime (MAXIPIME) IV 2 g (08/18/21 0959)    PRN Meds: acetaminophen, albuterol, ipratropium-albuterol, oxyCODONE, senna-docusate, traZODone  Physical Exam Pulmonary:     Comments: On high flow cannula.  Neurological:     Mental Status: She is alert.             Vital Signs: BP (!) 115/59 (BP Location: Left Arm)   Pulse 87   Temp 97.8 F (36.6 C) (Oral)   Resp 18   Wt 90.7 kg   SpO2 94%   BMI 35.42 kg/m  SpO2: SpO2: 94 % O2 Device: O2 Device: High Flow Nasal Cannula O2 Flow Rate: O2 Flow Rate (L/min): 45 L/min  Intake/output summary:  Intake/Output Summary (Last 24 hours) at 08/18/2021 1029 Last data filed at 08/18/2021 1006 Gross per 24 hour  Intake 1660 ml  Output 1550 ml  Net 110 ml   LBM: Last BM Date : 08/15/21 Baseline Weight: Weight: 93.5 kg Most recent weight: Weight: 90.7 kg     Patient Active Problem List   Diagnosis Date Noted   Pericardial effusion    Permanent atrial fibrillation (HCC)    Sepsis due to pneumonia ([place]) 08/14/2021   HAP (hospital-acquired pneumonia) 08/14/2021   Severe pulmonary hypertension ([place]) 05/29/2021   OSA (obstructive sleep apnea) 05/29/2021   Elevated troponin 05/28/2021   Lower abdominal pain 05/28/2021   Nausea and vomiting 05/28/2021   Prolonged QT interval 05/28/2021   At risk for fall due to comorbid condition 05/02/2021   Pleural effusion    Acute kidney injury superimposed on CKD (HCC)    Impaired gait and mobility 12/20/2020   Pulmonary nodules/lesions, multiple 10/29/2020   Anemia in chronic kidney disease 10/29/2020   Aortic stenosis, moderate    AF (paroxysmal atrial fibrillation) ([place])  Gastroesophageal reflux disease without esophagitis    Chronic respiratory failure with hypercapnia (HCC) 05/21/2020   Severe sepsis ([place]) 05/21/2020   UTI (urinary tract infection) 05/21/2020   Hyperglycemia due to type 2 diabetes mellitus ([place]) 05/21/2020    Abnormal CT of liver 05/21/2020   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 05/21/2020   Morbid obesity ([place]) 05/08/2020   Athscl heart disease 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 03/06/2020   Acute hip pain, left 10/23/2019   Lumbar stenosis with neurogenic claudication 10/23/2019   COPD with acute exacerbation ([place]) 63/81/7711   Acute diastolic CHF (congestive heart failure) ([place]) 09/28/2019   (HFpEF) heart failure with preserved ejection fraction ([place]) 09/27/2019   Hypokalemia 06/28/2019   Hyponatremia 06/28/2019   Uncontrolled type 2 diabetes mellitus with hyperglycemia, with long-term current use of insulin ([place]) 06/28/2019   CKD (chronic kidney disease), stage IIIa 06/28/2019   Atrial fibrillation, chronic ([place]) 06/28/2019   Pulmonary edema 02/27/2019   Bradycardia 02/24/2019   Acute on chronic diastolic CHF (congestive heart failure) ([place]) 02/23/2019   Chronic respiratory failure with hypoxia ([place]) 02/23/2019   Chest pain 02/13/2019   Osteopenia of neck of left femur 11/20/2018   AVM (arteriovenous malformation) of small bowel, acquired    Acute gastric ulcer with hemorrhage    Chronic diastolic heart failure ([place]) 08/22/2018   Diarrhea 08/22/2018   Junctional bradycardia    Acute on chronic heart failure with preserved ejection fraction (HFpEF) ([place])    AKI (acute kidney injury) ([place])    Symptomatic bradycardia 08/05/2018   Acute on chronic respiratory failure ([place]) 06/25/2018   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 diabetes mellitus ([place]) 01/25/2018   History of non anemic vitamin B12 deficiency 01/25/2018   Personal history of kidney stones 11/12/2017   Urge incontinence 11/12/2017   History of leukocytosis 09/17/2017   GI bleed 08/26/2017   Arthritis 08/10/2017   Stage 4 chronic kidney disease ([place]) 08/10/2017   COPD (chronic obstructive pulmonary disease) ([place]) 08/10/2017   Diabetes mellitus type 2, uncomplicated ([place]) 65/79/0383   Hypertension  08/10/2017   Obesity (BMI 35.0-39.9 without comorbidity) 04/11/2017   Primary osteoarthritis of right knee 09/01/2016   Leucocytosis 10/19/2015   Asterixis 01/07/2015   Acute on chronic respiratory failure with hypoxia and hypercapnia ([place]) 01/07/2015   Sciatica 01/07/2015   Weakness 01/07/2015   Chronic 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 01/04/2015   Iron deficiency anemia 06/22/2014   Microalbuminuria 06/22/2014   CHF (congestive heart failure) ([place]) 04/20/2014   Edema, peripheral 04/20/2014    Palliative Care Assessment & Plan    Recommendations/Plan: Patient herself wants to continue current care for now and take one day at a time. She understands she cannot leave hospital on heated high flow, and tells me she would not want to.  She understands and is grateful that if anyone comes in to speak with her, she can request staff/provider to return at another time when she is alone, if she would like. PMT will continue to follow.     Code Status:    Code Status Orders  (From admission, onward)           Start     Ordered   08/14/21 1844  Do not attempt resuscitation (DNR)  Continuous       Question Answer Comment  In the event of cardiac or respiratory ARREST Do not call a "code blue"   In the event of cardiac or respiratory ARREST  Do not perform Intubation, CPR, defibrillation or ACLS   In the event of cardiac or respiratory ARREST Use medication by any route, position, wound care, and other measures to relive pain and suffering. May use oxygen, suction and manual treatment of airway obstruction as needed for comfort.      08/14/21 1849           Code Status History     Date Active Date Inactive Code Status Order ID Comments User Context   08/03/2021 1714 08/10/2021 2302 DNR [id number]  Leslee Home, DO ED   05/28/2021 1927 06/02/2021 2335 DNR [id number]  Norval Morton, MD ED   05/28/2021 1851 05/28/2021 1927 Full Code [id number]  Norval Morton, MD ED    02/03/2021 1805 02/12/2021 1902 DNR [id number]  Mercy Riding, MD Inpatient   02/01/2021 1653 02/03/2021 1805 Full Code [id number]  Collier Bullock, MD ED   10/29/2020 1650 11/09/2020 1747 Full Code [id number]  Cox, [place], DO ED   06/17/2020 2215 06/21/2020 2140 Full Code [id number]  Mansy, Arvella Merles, MD ED   05/21/2020 2251 05/25/2020 1950 Full Code [id number]  Athena Masse, MD Inpatient   09/28/2019 0120 10/02/2019 2116 DNR [id number]  Sueanne Margarita, [place] ED   06/28/2019 1333 06/30/2019 1619 DNR [id number]  Ivor Costa, MD Inpatient   06/28/2019 1255 06/28/2019 1333 DNR [id number]  Ivor Costa, MD Inpatient   02/23/2019 1938 02/27/2019 1810 Full Code [id number]  Edwin Dada, MD Inpatient   02/13/2019 2011 02/16/2019 1819 DNR [id number]  Nicolette Bang, DO ED   09/14/2018 2220 09/17/2018 2350 DNR [id number]  Henreitta Leber, MD Inpatient   08/05/2018 1547 08/10/2018 1949 Full Code [id number]  Gladstone Lighter, MD Inpatient   06/25/2018 1417 06/30/2018 1557 Full Code [id number]  Epifanio Lesches, MD ED   09/15/2017 1747 09/18/2017 2106 DNR [id number]  Hillary Bow, MD ED   08/26/2017 2216 08/30/2017 1604 Full Code [id number]  Nicholes Mango, MD Inpatient   01/08/2015 0307 01/09/2015 1336 Full Code [id number]  Theodoro Grist, MD Inpatient      Advance Directive Documentation    Flowsheet Row Most Recent Value  Type of Advance Directive Out of facility DNR (pink MOST or yellow form)  Pre-existing out of facility DNR order (yellow form or pink MOST form) --  "MOST" Form in Place? --       Prognosis:  Poor overall  Care plan was discussed with RN  Thank you for allowing the Palliative Medicine Team to assist in the care of this patient.   Asencion Gowda, NP  Please contact Palliative Medicine Team phone at [phone] for questions and concerns.

## 2021-08-18 NOTE — Progress Notes (Signed)
Progress Note  Patient Name: Karen [name] Date of Encounter: 08/18/2021  [facility] Cardiologist: Ida Rogue, MD   Subjective   Lethargic, on high flow nasal cannula oxygen Remains on IV Lasix, -3 L total Limited echo June 13, moderate pericardial effusion Hemodynamically stable On broad-spectrum antibiotics for sepsis, pneumonia  Inpatient Medications    Scheduled Meds:  allopurinol  100 mg Oral Daily   atorvastatin  10 mg Oral Daily   budesonide (PULMICORT) nebulizer solution  0.25 mg Nebulization BID   chlorhexidine  15 mL Mouth Rinse BID   diltiazem  60 mg Oral BID   empagliflozin  10 mg Oral Daily   ferrous sulfate  325 mg Oral Q breakfast   fluticasone  2 spray Each Nare Daily   furosemide  40 mg Intravenous BID   insulin aspart  0-20 Units Subcutaneous TID WC   insulin aspart  10 Units Subcutaneous TID WC   insulin glargine-yfgn  25 Units Subcutaneous BID   ipratropium  2 spray Each Nare QID   lidocaine  1 patch Transdermal Q24H   mouth rinse  15 mL Mouth Rinse q12n4p   melatonin  5 mg Oral QHS   montelukast  10 mg Oral QHS   pantoprazole  40 mg Oral Daily   spironolactone  12.5 mg Oral Daily   umeclidinium-vilanterol  1 puff Inhalation Daily   vitamin B-12  500 mcg Oral Daily   Continuous Infusions:  ceFEPime (MAXIPIME) IV Stopped (08/18/21 1130)   PRN Meds: acetaminophen, albuterol, ipratropium-albuterol, oxyCODONE, senna-docusate, traZODone   Vital Signs    Vitals:   08/18/21 1158 08/18/21 1200 08/18/21 1520 08/18/21 1536  BP:   (!) 110/59   Pulse:  95 86 94  Resp:  (!) 21 (!) 25 (!) 23  Temp: (!) 97.5 F (36.4 C)  97.8 F (36.6 C)   TempSrc: Oral  Oral   SpO2:  95% 95% 100%  Weight:        Intake/Output Summary (Last 24 hours) at 08/18/2021 1706 Last data filed at 08/18/2021 1524 Gross per 24 hour  Intake 1900 ml  Output 1650 ml  Net 250 ml      08/18/2021    4:46 AM 08/17/2021    5:00 AM 08/14/2021    2:52 PM  Last 3  Weights  Weight (lbs) 199 lb 15.3 oz 205 lb 0.4 oz 206 lb 2.1 oz  Weight (kg) 90.7 kg 93 kg 93.5 kg      Telemetry    Atrial fibrillation rate 80s to 90- Personally Reviewed  ECG     - Personally Reviewed  Physical Exam   GEN: No acute distress.  On high flow oxygen nasal cannula Neck: No JVD Cardiac: Irregularly irregular no murmurs, rubs, or gallops.  Respiratory: Clear to auscultation bilaterally. GI: Soft, nontender, non-distended  MS: No edema; No deformity. Neuro:  Nonfocal  Psych: Normal affect   Labs    High Sensitivity Troponin:   Recent Labs  Lab 08/03/21 1324 08/03/21 1549 08/14/21 1518 08/14/21 1653  TROPONINIHS 11 10 16 12      Chemistry Recent Labs  Lab 08/14/21 1518 08/15/21 0558 08/16/21 0525 08/17/21 0632 08/18/21 0553  NA 119*   < > 130* 133* 135  K 4.2   < > 3.4* 3.7 3.4*  CL 80*   < > 90* 93* 93*  CO2 25   < > 30 32 33*  GLUCOSE 458*   < > 151* 179* 142*  BUN 69*   < >  44* 46* 39*  CREATININE 1.81*   < > 1.04* 1.01* 0.80  CALCIUM 8.9   < > 9.0 8.8* 8.6*  PROT 7.1  --   --   --   --   ALBUMIN 3.1*  --   --   --   --   AST 24  --   --   --   --   ALT 20  --   --   --   --   ALKPHOS 91  --   --   --   --   BILITOT 0.9  --   --   --   --   GFRNONAA 28*   < > 55* 57* >60  ANIONGAP 14   < > 10 8 9    < > = values in this interval not displayed.    Lipids No results for input(s): "CHOL", "TRIG", "HDL", "LABVLDL", "LDLCALC", "CHOLHDL" in the last 168 hours.  Hematology Recent Labs  Lab 08/16/21 0525 08/17/21 0632 08/18/21 0553  WBC 25.1* 14.0* 17.2*  RBC 3.93 3.71* 3.71*  HGB 11.1* 10.4* 10.6*  HCT 34.3* 32.7* 33.0*  MCV 87.3 88.1 88.9  MCH 28.2 28.0 28.6  MCHC 32.4 31.8 32.1  RDW 14.5 14.6 14.8  PLT 371 327 367   Thyroid No results for input(s): "TSH", "FREET4" in the last 168 hours.  BNP Recent Labs  Lab 08/14/21 1530  BNP 85.0    DDimer No results for input(s): "DDIMER" in the last 168 hours.   Radiology     ECHOCARDIOGRAM LIMITED  Result Date: 08/16/2021    ECHOCARDIOGRAM LIMITED REPORT   Patient Name:   Karen [name] Date of Exam: 08/16/2021 Medical Rec #:  [id number]        Height:       63.0 in Accession #:    [id number]       Weight:       206.1 lb Date of Birth:  1945/06/01       BSA:          1.959 m Patient Age:    39 years         BP:           104/63 mmHg Patient Gender: F                HR:           91 bpm. Exam Location:  [facility] Procedure: Limited Color Doppler and Limited Echo Indications:     I31.3 Pericardial effusion  History:         Patient has prior history of Echocardiogram examinations, most                  recent 08/04/2021. CHF, COPD and Pulmonary HTN; Risk                  Factors:Former Smoker, Diabetes and Hypertension.  Sonographer:     Rosalia Hammers Referring Phys:  [phone] CHRISTOP[facility] END Diagnosing Phys: Nelva Bush MD  Sonographer Comments: Image acquisition challenging due to patient body habitus, Image acquisition challenging due to COPD and Image acquisition challenging due to respiratory motion. IMPRESSIONS  1. Left ventricular ejection fraction, by estimation, is 55 to 60%. The left ventricle has normal function. The left ventricle has no regional wall motion abnormalities. There is moderate left ventricular hypertrophy.  2. Right ventricular systolic function is moderately reduced. The right ventricular size is normal. Moderately increased right ventricular wall thickness. There is  severely elevated pulmonary artery systolic pressure.  3. Moderate pericardial effusion. The pericardial effusion is circumferential, though the largest fluid collections are inferolateral to the LV and adjacent to the RA. There is no definite evidence of tamponade physiology, though evaluation is limited without mitral/tricuspid valve Doppler waveforms to assess for respiratory variation.  4. Mild mitral valve regurgitation.  5. Tricuspid valve regurgitation is moderate.  6. The aortic valve has an  indeterminant number of cusps. There is moderate calcification of the aortic valve. There is moderate thickening of the aortic valve.  7. The inferior vena cava is dilated in size with <50% respiratory variability, suggesting right atrial pressure of 15 mmHg. FINDINGS  Left Ventricle: Left ventricular ejection fraction, by estimation, is 55 to 60%. The left ventricle has normal function. The left ventricle has no regional wall motion abnormalities. The left ventricular internal cavity size was normal in size. There is  moderate left ventricular hypertrophy. Right Ventricle: The right ventricular size is normal. Moderately increased right ventricular wall thickness. Right ventricular systolic function is moderately reduced. There is severely elevated pulmonary artery systolic pressure. The tricuspid regurgitant velocity is 3.46 m/s, and with an assumed right atrial pressure of 15 mmHg, the estimated right ventricular systolic pressure is 53.9 mmHg. Pericardium: A moderately sized pericardial effusion is present. The pericardial effusion is circumferential, though the largest fluid collections are inferolateral to the LV and adjacent to the RA. Mitral Valve: Mild mitral valve regurgitation. Tricuspid Valve: The tricuspid valve is normal in structure. Tricuspid valve regurgitation is moderate. Aortic Valve: The aortic valve has an indeterminant number of cusps. There is moderate calcification of the aortic valve. There is moderate thickening of the aortic valve. Pulmonic Valve: The pulmonic valve was normal in structure. Pulmonic valve regurgitation is trivial. No evidence of pulmonic stenosis. Venous: The inferior vena cava is dilated in size with less than 50% respiratory variability, suggesting right atrial pressure of 15 mmHg. IAS/Shunts: No atrial level shunt detected by color flow Doppler. LEFT VENTRICLE PLAX 2D LVIDd:         3.12 cm LVIDs:         2.03 cm LV PW:         1.54 cm LV IVS:        1.46 cm  RIGHT  VENTRICLE RV Basal diam:  3.67 cm LEFT ATRIUM         Index LA diam:    4.10 cm 2.09 cm/m  PULMONIC VALVE PV Vmax:          1.08 m/s PV Vmean:         68.300 cm/s PV VTI:           0.142 m PV Peak grad:     4.7 mmHg PV Mean grad:     2.0 mmHg PR End Diast Vel: 2.50 msec  TRICUSPID VALVE TR Peak grad:   47.9 mmHg TR Vmax:        346.00 cm/s Nelva Bush MD Electronically signed by Nelva Bush MD Signature Date/Time: 08/16/2021/5:30:44 PM    Final (Updated)     Cardiac Studies     Patient Profile     76 y.o. female with history of HFpEF, COPD, obesity, respiratory failure, oxygen presenting with shortness of breath and edema, diagnosed with pneumonia and respiratory failure being seen for A-fib RVR and HFpEF   Assessment & Plan    HFpEF Appears to have additional fluid, continued respiratory distress on high flow nasal cannula oxygen -3 L total  We will continue IV Lasix twice daily, Aldactone Renal function improving consistent with cardiorenal syndrome   2.   moderate pericardial effusion -Continue diuresis as above -Holding Eliquis to prevent/reduce risk of hemorrhagic conversion, and in the chance that a pericardiocentesis is needed -Repeat limited echo tomorrow   3.  Persistent A-fib -Heart rate controlled -Cardizem 60 mg twice daily.  Eliquis on hold as above. Could restart if no significant worsening in pericardial effusion   4.  COPD, respiratory failure, pneumonia -Antibiotics, management as per medicine team On high flow nasal cannula   Total encounter time more than 50 minutes  Greater than 50% was spent in counseling and coordination of care with the patient      For questions or updates, please contact [place] Please consult [email or web address] for contact info under        Signed, Ida Rogue, MD  08/18/2021, 5:06 PM

## 2021-08-18 NOTE — Progress Notes (Signed)
PT Cancellation Note  Patient Details Name: Karen [name] MRN: [id number] DOB: 07-26-1945   Cancelled Treatment:    Reason Eval/Treat Not Completed: Pain limiting ability to participate. Orders received and chart reviewed. Upon entry to room pt declining PT eval at this time. PT reporting chest pain requesting medication. RN to be informed. PT to re-attempt at a later time/date as available and medically appropriate.    Salem Caster. Fairly IV, PT, DPT Physical Therapist- [facility]  08/18/2021, 3:32 PM

## 2021-08-18 NOTE — Progress Notes (Signed)
PROGRESS NOTE    Karen [name]  OIN:[id number] DOB: Mar 01, 1946 DOA: 08/14/2021 PCP: Margarita Rana, MD    Assessment & Plan:   Principal Problem:   HAP (hospital-acquired pneumonia) Active Problems:   Acute on chronic heart failure with preserved ejection fraction (HFpEF) (HCC)   Hyponatremia   Sepsis due to pneumonia ([place])   Chronic respiratory failure with hypoxia (HCC)   Hypokalemia   Uncontrolled type 2 diabetes mellitus with hyperglycemia, with long-term current use of insulin (HCC)   AF (paroxysmal atrial fibrillation) (HCC)   Acute kidney injury superimposed on CKD (HCC)   OSA (obstructive sleep apnea)   COPD (chronic obstructive pulmonary disease) (HCC)   Chest pain   Gastroesophageal reflux disease without esophagitis   Pericardial effusion   Permanent atrial fibrillation ([place])  Assessment and Plan: Sepsis: secondary to hospital-acquired pneumonia. Was recently hospitalized here and was discharged to skilled nursing facility on 08/10/2021. Continue on IV cefepime    Acute on chronic hypoxic respiratory failure: chronically on 3L [place] but still on HFNC . Has hx of CHF, COPD, OSA, pulmonary hypertension. BiPAP prn.     Acute on chronic diastolic CHF: echo done on 08/04/2021 showed normal EF, grade III diastolic dysfunction, TR. Continue on IV lasix. Monitor I/Os    COPD exacerbation: continue on bronchodilators & supplemental oxygen    PAF: continue on cardizem. Hold eliquis  B/l pleural effusions: s/p L thoracentesis w/ 819mL of clear fluid removed on 08/16/21. Continue on lasix   Pericardiac effusion: w/o cardiac tamponade physiology as per cardio. Management as per cardio    Chronic hyponatremia: likely contributed to by hypervolemic hyponatremia from CHF and also hyperglycemia. WNL today   DM2: HbA1c 9.1, poorly controlled. Continue on glargine, aspart, & SSI w/ accuchecks    Hypokalemia: potassium given    OSA: CPAP qhs    GERD: continue on PPI    Chest  pain:  EKG showed sinus tachycardia.  Chest wall was tender on palpitation.  Most likely atypical chest pain.  Troponins are negative. No chest pain today    AKI on CKD stage IIIa: Cr is better than baseline today    Morbid obesity: BMI 35.4. Complicates overall care & prognosis      DVT prophylaxis: SCDs Code Status:  DNR Family Communication:  Disposition Plan: depends on PT/OT recs   Level of care: Progressive  Status is: Inpatient Remains inpatient appropriate because: severity of illness, still on HFNC     Consultants:  Cardio  Palliative care   Procedures:   Antimicrobials: cefepime   Subjective: Pt c/o shortness of breath   Objective: Vitals:   08/17/21 2002 08/17/21 2238 08/18/21 0049 08/18/21 0446  BP: (!) 114/58  126/60 113/73  Pulse: 87  88 89  Resp: (!) 24  18 (!) 23  Temp:   98 F (36.7 C) 97.9 F (36.6 C)  TempSrc:   Axillary Axillary  SpO2: 98% 98% 98% 98%  Weight:    90.7 kg    Intake/Output Summary (Last 24 hours) at 08/18/2021 0731 Last data filed at 08/18/2021 0400 Gross per 24 hour  Intake 1420 ml  Output 1450 ml  Net -30 ml   Filed Weights   08/14/21 1452 08/17/21 0500 08/18/21 0446  Weight: 93.5 kg 93 kg 90.7 kg    Examination:  General exam: Appears uncomfortable  Respiratory system: decreased breath sounds b/l  Cardiovascular system: irregularly irregular. No rubs or clicks  Gastrointestinal system: Abd is soft, NT, obese &  hypoactive bowel sounds  Central nervous system: Alert and oriented. Moves all extremities   Psychiatry: Judgement and insight appears normal. Flat mood and affect     Data Reviewed: I have personally reviewed following labs and imaging studies  CBC: Recent Labs  Lab 08/14/21 1518 08/15/21 0558 08/16/21 0525 08/17/21 0632 08/18/21 0553  WBC 25.1* 23.6* 25.1* 14.0* 17.2*  NEUTROABS 22.1*  --   --   --   --   HGB 11.0* 10.7* 11.1* 10.4* 10.6*  HCT 33.6* 32.1* 34.3* 32.7* 33.0*  MCV 86.4 85.1  87.3 88.1 88.9  PLT 346 291 371 327 035   Basic Metabolic Panel: Recent Labs  Lab 08/14/21 1518 08/15/21 0558 08/15/21 1231 08/16/21 0525 08/17/21 0632 08/18/21 0553  NA 119* 124*  --  130* 133* 135  K 4.2 4.2  --  3.4* 3.7 3.4*  CL 80* 85*  --  90* 93* 93*  CO2 25 27  --  30 32 33*  GLUCOSE 458* 347* 401* 151* 179* 142*  BUN 69* 58*  --  44* 46* 39*  CREATININE 1.81* 1.15*  --  1.04* 1.01* 0.80  CALCIUM 8.9 8.9  --  9.0 8.8* 8.6*   GFR: Estimated Creatinine Clearance: 62.9 mL/min (by C-G formula based on SCr of 0.8 mg/dL). Liver Function Tests: Recent Labs  Lab 08/14/21 1518  AST 24  ALT 20  ALKPHOS 91  BILITOT 0.9  PROT 7.1  ALBUMIN 3.1*   No results for input(s): "LIPASE", "AMYLASE" in the last 168 hours. No results for input(s): "AMMONIA" in the last 168 hours. Coagulation Profile: No results for input(s): "INR", "PROTIME" in the last 168 hours. Cardiac Enzymes: No results for input(s): "CKTOTAL", "CKMB", "CKMBINDEX", "TROPONINI" in the last 168 hours. BNP (last 3 results) No results for input(s): "PROBNP" in the last 8760 hours. HbA1C: No results for input(s): "HGBA1C" in the last 72 hours. CBG: Recent Labs  Lab 08/16/21 2026 08/17/21 0734 08/17/21 1126 08/17/21 1617 08/17/21 2006  GLUCAP 188* 182* 295* 174* 178*   Lipid Profile: No results for input(s): "CHOL", "HDL", "LDLCALC", "TRIG", "CHOLHDL", "LDLDIRECT" in the last 72 hours. Thyroid Function Tests: No results for input(s): "TSH", "T4TOTAL", "FREET4", "T3FREE", "THYROIDAB" in the last 72 hours. Anemia Panel: No results for input(s): "VITAMINB12", "FOLATE", "FERRITIN", "TIBC", "IRON", "RETICCTPCT" in the last 72 hours. Sepsis Labs: Recent Labs  Lab 08/14/21 1653 08/14/21 1910  PROCALCITON 0.31  --   LATICACIDVEN 2.5* 2.2*    Recent Results (from the past 240 hour(s))  Blood culture (routine x 2)     Status: None (Preliminary result)   Collection Time: 08/14/21  4:53 PM   Specimen:  BLOOD  Result Value Ref Range Status   Specimen Description BLOOD [facility]  Final   Special Requests BOTTLES DRAWN AEROBIC AND ANAEROBIC BCLV  Final   Culture   Final    NO GROWTH 4 DAYS Performed at [facility], [address] [place], [place] [address]    Report Status PENDING  Incomplete  Blood culture (routine x 2)     Status: None (Preliminary result)   Collection Time: 08/14/21  4:55 PM   Specimen: BLOOD  Result Value Ref Range Status   Specimen Description BLOOD RIGHT ANTECUBITAL  Final   Special Requests   Final    BOTTLES DRAWN AEROBIC AND ANAEROBIC Blood Culture adequate volume   Culture   Final    NO GROWTH 4 DAYS Performed at [facility], [address] [place],  [place] [address]    Report Status PENDING  Incomplete  MRSA Next Gen by PCR, Nasal     Status: None   Collection Time: 08/15/21  5:50 PM   Specimen: Nasal Mucosa; Nasal Swab  Result Value Ref Range Status   MRSA by PCR Next Gen NOT DETECTED NOT DETECTED Final    Comment: (NOTE) The GeneXpert MRSA Assay (FDA approved for NASAL specimens only), is one component of a comprehensive MRSA colonization surveillance program. It is not intended to diagnose MRSA infection nor to guide or monitor treatment for MRSA infections. Test performance is not FDA approved in patients less than 92 years old. Performed at [name], [address] [place], [place] [address]   Body fluid culture w Gram Stain     Status: None (Preliminary result)   Collection Time: 08/16/21  4:20 PM   Specimen: PATH Cytology Pleural fluid  Result Value Ref Range Status   Specimen Description   Final    PLEURAL Performed at [facility], [address], [place], [place] [address]    Special Requests   Final    PLEURAL Performed at [facility], [address] [place], [place] [address]    Gram Stain   Final    CYTOSPIN SMEAR WBC PRESENT,BOTH PMN AND MONONUCLEAR NO ORGANISMS SEEN     Culture   Final    NO GROWTH < 12 HOURS Performed at [facility], [place] [address], [place], [place] [address]    Report Status PENDING  Incomplete         Radiology Studies: US THORACENTESIS ASP PLEURAL SPACE W/IMG GUIDE  Result Date: 08/17/2021 INDICATION: Left pleural effusion EXAM: ULTRASOUND GUIDED LEFT THORACENTESIS MEDICATIONS: None. COMPLICATIONS: None immediate. PROCEDURE: An ultrasound guided thoracentesis was thoroughly discussed with the patient and questions answered. The benefits, risks, alternatives and complications were also discussed. The patient understands and wishes to proceed with the procedure. Written consent was obtained. Ultrasound was performed to localize and mark an adequate pocket of fluid in the left chest. The area was then prepped and draped in the normal sterile fashion. 1% Lidocaine was used for local anesthesia. Under ultrasound guidance a 19 gauge Yueh catheter was introduced. Thoracentesis was performed. The catheter was removed and a dressing applied. FINDINGS: A total of approximately 850 mL of clear, straw-colored fluid was removed. IMPRESSION: Successful ultrasound guided left thoracentesis yielding 850 mL of clear pleural fluid. Electronically Signed   By: Albin Felling M.D.   On: 08/17/2021 16:25   ECHOCARDIOGRAM LIMITED  Result Date: 08/16/2021    ECHOCARDIOGRAM LIMITED REPORT   Patient Name:   JOYEL CHENETTE Date of Exam: 08/16/2021 Medical Rec #:  [id number]        Height:       63.0 in Accession #:    [id number]       Weight:       206.1 lb Date of Birth:  04/26/1945       BSA:          1.959 m Patient Age:    76 years         BP:           104/63 mmHg Patient Gender: F                HR:           91 bpm. Exam Location:  [facility] Procedure: Limited Color Doppler and Limited Echo Indications:  I31.3 Pericardial effusion  History:         Patient has prior history of Echocardiogram examinations, most                  recent 08/04/2021. CHF, COPD  and Pulmonary HTN; Risk                  Factors:Former Smoker, Diabetes and Hypertension.  Sonographer:     Rosalia Hammers Referring Phys:  [phone] CHRISTOPHER END Diagnosing Phys: Nelva Bush MD  Sonographer Comments: Image acquisition challenging due to patient body habitus, Image acquisition challenging due to COPD and Image acquisition challenging due to respiratory motion. IMPRESSIONS  1. Left ventricular ejection fraction, by estimation, is 55 to 60%. The left ventricle has normal function. The left ventricle has no regional wall motion abnormalities. There is moderate left ventricular hypertrophy.  2. Right ventricular systolic function is moderately reduced. The right ventricular size is normal. Moderately increased right ventricular wall thickness. There is severely elevated pulmonary artery systolic pressure.  3. Moderate pericardial effusion. The pericardial effusion is circumferential, though the largest fluid collections are inferolateral to the LV and adjacent to the RA. There is no definite evidence of tamponade physiology, though evaluation is limited without mitral/tricuspid valve Doppler waveforms to assess for respiratory variation.  4. Mild mitral valve regurgitation.  5. Tricuspid valve regurgitation is moderate.  6. The aortic valve has an indeterminant number of cusps. There is moderate calcification of the aortic valve. There is moderate thickening of the aortic valve.  7. The inferior vena cava is dilated in size with <50% respiratory variability, suggesting right atrial pressure of 15 mmHg. FINDINGS  Left Ventricle: Left ventricular ejection fraction, by estimation, is 55 to 60%. The left ventricle has normal function. The left ventricle has no regional wall motion abnormalities. The left ventricular internal cavity size was normal in size. There is  moderate left ventricular hypertrophy. Right Ventricle: The right ventricular size is normal. Moderately increased right ventricular wall  thickness. Right ventricular systolic function is moderately reduced. There is severely elevated pulmonary artery systolic pressure. The tricuspid regurgitant velocity is 3.46 m/s, and with an assumed right atrial pressure of 15 mmHg, the estimated right ventricular systolic pressure is 96.0 mmHg. Pericardium: A moderately sized pericardial effusion is present. The pericardial effusion is circumferential, though the largest fluid collections are inferolateral to the LV and adjacent to the RA. Mitral Valve: Mild mitral valve regurgitation. Tricuspid Valve: The tricuspid valve is normal in structure. Tricuspid valve regurgitation is moderate. Aortic Valve: The aortic valve has an indeterminant number of cusps. There is moderate calcification of the aortic valve. There is moderate thickening of the aortic valve. Pulmonic Valve: The pulmonic valve was normal in structure. Pulmonic valve regurgitation is trivial. No evidence of pulmonic stenosis. Venous: The inferior vena cava is dilated in size with less than 50% respiratory variability, suggesting right atrial pressure of 15 mmHg. IAS/Shunts: No atrial level shunt detected by color flow Doppler. LEFT VENTRICLE PLAX 2D LVIDd:         3.12 cm LVIDs:         2.03 cm LV PW:         1.54 cm LV IVS:        1.46 cm  RIGHT VENTRICLE RV Basal diam:  3.67 cm LEFT ATRIUM         Index LA diam:    4.10 cm 2.09 cm/m  PULMONIC VALVE PV Vmax:  1.08 m/s PV Vmean:         68.300 cm/s PV VTI:           0.142 m PV Peak grad:     4.7 mmHg PV Mean grad:     2.0 mmHg PR End Diast Vel: 2.50 msec  TRICUSPID VALVE TR Peak grad:   47.9 mmHg TR Vmax:        346.00 cm/s Nelva Bush MD Electronically signed by Nelva Bush MD Signature Date/Time: 08/16/2021/5:30:44 PM    Final (Updated)    DG Chest Port 1 View  Result Date: 08/16/2021 CLINICAL DATA:  Post thoracentesis EXAM: PORTABLE CHEST 1 VIEW COMPARISON:  08/15/2021, CT 08/16/2021 FINDINGS: Decreased left pleural effusion  post thoracentesis. No pneumothorax. Cardiomegaly with vascular congestion and hazy edema or atelectasis at the bases. Small residual pleural effusions. IMPRESSION: 1. Decreased left effusion post thoracentesis.  No pneumothorax 2. Cardiomegaly with vascular congestion and at least small residual bilateral effusions. Hazy edema or atelectasis at the bases Electronically Signed   By: Donavan Foil M.D.   On: 08/16/2021 17:20   CT CHEST WO CONTRAST  Result Date: 08/16/2021 CLINICAL DATA:  Pneumonia, CHF EXAM: CT CHEST WITHOUT CONTRAST TECHNIQUE: Multidetector CT imaging of the chest was performed following the standard protocol without IV contrast. RADIATION DOSE REDUCTION: This exam was performed according to the departmental dose-optimization program which includes automated exposure control, adjustment of the mA and/or kV according to patient size and/or use of iterative reconstruction technique. COMPARISON:  Previous CT done on 02/05/2021 and chest radiograph done on 08/15/2021 FINDINGS: Cardiovascular: There is interval appearance of moderate to large pericardial effusion. There are scattered coronary artery calcifications. Mediastinum/Nodes: There are slightly enlarged lymph nodes in the mediastinum with no significant interval change. Lungs/Pleura: There is interval decrease in size of moderate right pleural effusion. There is large left pleural effusion with significant interval increase. Infiltrates are seen in the posterior aspect of left mid and both lower lung fields, more so on the left side suggesting compression atelectasis and possibly pneumonia. There is no pneumothorax. Upper Abdomen: Unremarkable. Musculoskeletal: Unremarkable. IMPRESSION: There is interval appearance of moderate to large pericardial effusion. Coronary artery calcifications are seen. Moderate to large bilateral pleural effusions, more so on the left side. There is significant interval increase in amount of left pleural effusion.  Infiltrates are noted in the posterior left mid and both lower lung fields, more so on the left side suggesting compression atelectasis and possibly underlying pneumonia. Electronically Signed   By: Elmer Picker M.D.   On: 08/16/2021 15:24        Scheduled Meds:  allopurinol  100 mg Oral Daily   atorvastatin  10 mg Oral Daily   budesonide (PULMICORT) nebulizer solution  0.25 mg Nebulization BID   chlorhexidine  15 mL Mouth Rinse BID   diltiazem  60 mg Oral BID   empagliflozin  10 mg Oral Daily   ferrous sulfate  325 mg Oral Q breakfast   fluticasone  2 spray Each Nare Daily   furosemide  40 mg Intravenous BID   insulin aspart  0-20 Units Subcutaneous TID WC   insulin aspart  10 Units Subcutaneous TID WC   insulin glargine-yfgn  25 Units Subcutaneous BID   ipratropium  2 spray Each Nare QID   lidocaine  1 patch Transdermal Q24H   mouth rinse  15 mL Mouth Rinse q12n4p   melatonin  5 mg Oral QHS   montelukast  10 mg Oral QHS  pantoprazole  40 mg Oral Daily   spironolactone  12.5 mg Oral Daily   umeclidinium-vilanterol  1 puff Inhalation Daily   vitamin B-12  500 mcg Oral Daily   Continuous Infusions:  ceFEPime (MAXIPIME) IV Stopped (08/18/21 0746)     LOS: 4 days    Time spent: 30 mins     Wyvonnia Dusky, MD [facility] Pager [phone]  If 7PM-7AM, please contact night-coverage 08/18/2021, 7:31 AM

## 2021-08-19 DIAGNOSIS — J9621 Acute and chronic respiratory failure with hypoxia: Secondary | ICD-10-CM | POA: Diagnosis not present

## 2021-08-19 DIAGNOSIS — J432 Centrilobular emphysema: Secondary | ICD-10-CM | POA: Diagnosis not present

## 2021-08-19 DIAGNOSIS — I5033 Acute on chronic diastolic (congestive) heart failure: Secondary | ICD-10-CM | POA: Diagnosis not present

## 2021-08-19 LAB — BASIC METABOLIC PANEL
Anion gap: 7 (ref 5–15)
BUN: 31 mg/dL — ABNORMAL HIGH (ref 8–23)
CO2: 34 mmol/L — ABNORMAL HIGH (ref 22–32)
Calcium: 8.7 mg/dL — ABNORMAL LOW (ref 8.9–10.3)
Chloride: 92 mmol/L — ABNORMAL LOW (ref 98–111)
Creatinine, Ser: 0.8 mg/dL (ref 0.44–1.00)
GFR, Estimated: >60 mL/min (ref >60)
Glucose, Bld: 135 mg/dL — ABNORMAL HIGH (ref 70–99)
Potassium: 3.5 mmol/L (ref 3.5–5.1)
Sodium: 133 mmol/L — ABNORMAL LOW (ref 135–145)

## 2021-08-19 LAB — CBC
HCT: 31.6 % — ABNORMAL LOW (ref 36.0–46.0)
Hemoglobin: 10.1 g/dL — ABNORMAL LOW (ref 12.0–15.0)
MCH: 28 pg (ref 26.0–34.0)
MCHC: 32 g/dL (ref 30.0–36.0)
MCV: 87.5 fL (ref 80.0–100.0)
Platelets: 341 10*3/uL (ref 150–400)
RBC: 3.61 MIL/uL — ABNORMAL LOW (ref 3.87–5.11)
RDW: 14.8 % (ref 11.5–15.5)
WBC: 17 10*3/uL — ABNORMAL HIGH (ref 4.0–10.5)
nRBC: 0 % (ref 0.0–0.2)

## 2021-08-19 LAB — CULTURE, BLOOD (ROUTINE X 2)
Culture: NO GROWTH
Culture: NO GROWTH
Special Requests: ADEQUATE

## 2021-08-19 LAB — GLUCOSE, CAPILLARY
Glucose-Capillary: 186 mg/dL — ABNORMAL HIGH (ref 70–99)
Glucose-Capillary: 383 mg/dL — ABNORMAL HIGH (ref 70–99)
Glucose-Capillary: 143 mg/dL — ABNORMAL HIGH (ref 70–99)
Glucose-Capillary: 58 mg/dL — ABNORMAL LOW (ref 70–99)
Glucose-Capillary: 85 mg/dL (ref 70–99)

## 2021-08-19 MED ORDER — DILTIAZEM HCL ER 60 MG PO CP12
60.0000 mg | ORAL_CAPSULE | Freq: Three times a day (TID) | ORAL | Status: AC
  Administered 2021-08-19 – 2021-08-20 (×4): 60 mg via ORAL
  Filled 2021-08-19 (×6): qty 1

## 2021-08-19 MED ORDER — MORPHINE SULFATE (PF) 2 MG/ML IV SOLN
1.0000 mg | INTRAVENOUS | Status: DC | PRN
  Administered 2021-08-19 – 2021-08-29 (×29): 1 mg via INTRAVENOUS
  Filled 2021-08-19 (×30): qty 1

## 2021-08-19 NOTE — Progress Notes (Signed)
PROGRESS NOTE    Karen [name]  DPO:[id number] DOB: September 30, 1945 DOA: 08/14/2021 PCP: Margarita Rana, MD    Assessment & Plan:   Principal Problem:   HAP (hospital-acquired pneumonia) Active Problems:   Acute on chronic heart failure with preserved ejection fraction (HFpEF) (HCC)   Hyponatremia   Sepsis due to pneumonia ([place])   Chronic respiratory failure with hypoxia (HCC)   Hypokalemia   Uncontrolled type 2 diabetes mellitus with hyperglycemia, with long-term current use of insulin (HCC)   AF (paroxysmal atrial fibrillation) (HCC)   Acute kidney injury superimposed on CKD (HCC)   OSA (obstructive sleep apnea)   COPD (chronic obstructive pulmonary disease) (HCC)   Chest pain   Gastroesophageal reflux disease without esophagitis   Pericardial effusion   Permanent atrial fibrillation ([place])  Assessment and Plan: Sepsis: secondary to hospital-acquired pneumonia. Was recently hospitalized here and was discharged to skilled nursing facility on 08/10/2021. Continue on IV cefepime   Acute on chronic hypoxic respiratory failure: still on HFNC & unable to wean down so far. Uses 3L [employer] at home. Increased shortness of breath today. Morphine prn. Has hx of CHF, COPD, OSA, pulmonary hypertension. BiPAP prn.     Acute on chronic diastolic CHF: echo done on 08/04/2021 showed normal EF, grade III diastolic dysfunction, TR. Continue on IV lasix. Monitor I/Os  COPD exacerbation: continue on bronchodilators, supplemental oxygen   PAF: continue on cardizem. Hold eliquis   B/l pleural effusions: s/p L thoracentesis w/ 874mL of clear fluid removed on 08/16/21. Continue on lasix   Pericardiac effusion: w/o cardiac tamponade physiology as per cardio. Repeat limited echo today as per cardio    Chronic hyponatremia: likely contributed to by hypervolemic hyponatremia from CHF and also hyperglycemia. Labile   DM2: poorly controlled, HbA1c 9.1. Continue on glargine, aspart, & SSI w/ accuchecks     Hypokalemia: WNL today    OSA: CPAP qhs    GERD: continue on PPI    Chest pain:  EKG showed sinus tachycardia.  Chest wall was tender on palpitation.  Most likely atypical chest pain.  Troponins are negative. Resolved    AKI on CKD stage IIIa: Cr is stable     Morbid obesity: BMI 35.4. Complicates overall care & prognosis        DVT prophylaxis: SCDs Code Status:  DNR Family Communication:  Disposition Plan: PT recs SNF   Level of care: Progressive  Status is: Inpatient Remains inpatient appropriate because: severity of illness, still on HFNC     Consultants:  Cardio  Palliative care   Procedures:   Antimicrobials: cefepime   Subjective: Pt is c/o shortness of breath still   Objective: Vitals:   08/18/21 2007 08/18/21 2008 08/18/21 2351 08/19/21 0440  BP: 107/65  114/62 132/60  Pulse: 86  83 95  Resp: (!) 23  20 (!) 24  Temp: 98 F (36.7 C)  98.2 F (36.8 C) 98.6 F (37 C)  TempSrc: Oral  Oral Oral  SpO2: 94% 95% 93% 95%  Weight:    92.6 kg    Intake/Output Summary (Last 24 hours) at 08/19/2021 0735 Last data filed at 08/18/2021 2300 Gross per 24 hour  Intake 1420 ml  Output 1700 ml  Net -280 ml   Filed Weights   08/17/21 0500 08/18/21 0446 08/19/21 0440  Weight: 93 kg 90.7 kg 92.6 kg    Examination:  General exam: Appears calm but uncomfortable  Respiratory system: diminished breath sounds b/l  Cardiovascular system: irregularly irregular.  Gastrointestinal system: Abd is soft, NT, obese & normal bowel sounds  Central nervous system: alert and oriented. Moves all extremities  Psychiatry: Judgement and insight appears normal. Flat mood and affect    Data Reviewed: I have personally reviewed following labs and imaging studies  CBC: Recent Labs  Lab 08/14/21 1518 08/15/21 0558 08/16/21 0525 08/17/21 0632 08/18/21 0553 08/19/21 0606  WBC 25.1* 23.6* 25.1* 14.0* 17.2* 17.0*  NEUTROABS 22.1*  --   --   --   --   --   HGB 11.0*  10.7* 11.1* 10.4* 10.6* 10.1*  HCT 33.6* 32.1* 34.3* 32.7* 33.0* 31.6*  MCV 86.4 85.1 87.3 88.1 88.9 87.5  PLT 346 291 371 327 367 161   Basic Metabolic Panel: Recent Labs  Lab 08/15/21 0558 08/15/21 1231 08/16/21 0525 08/17/21 0632 08/18/21 0553 08/19/21 0606  NA 124*  --  130* 133* 135 133*  K 4.2  --  3.4* 3.7 3.4* 3.5  CL 85*  --  90* 93* 93* 92*  CO2 27  --  30 32 33* 34*  GLUCOSE 347* 401* 151* 179* 142* 135*  BUN 58*  --  44* 46* 39* 31*  CREATININE 1.15*  --  1.04* 1.01* 0.80 0.80  CALCIUM 8.9  --  9.0 8.8* 8.6* 8.7*   GFR: Estimated Creatinine Clearance: 63.7 mL/min (by C-G formula based on SCr of 0.8 mg/dL). Liver Function Tests: Recent Labs  Lab 08/14/21 1518  AST 24  ALT 20  ALKPHOS 91  BILITOT 0.9  PROT 7.1  ALBUMIN 3.1*   No results for input(s): "LIPASE", "AMYLASE" in the last 168 hours. No results for input(s): "AMMONIA" in the last 168 hours. Coagulation Profile: No results for input(s): "INR", "PROTIME" in the last 168 hours. Cardiac Enzymes: No results for input(s): "CKTOTAL", "CKMB", "CKMBINDEX", "TROPONINI" in the last 168 hours. BNP (last 3 results) No results for input(s): "PROBNP" in the last 8760 hours. HbA1C: No results for input(s): "HGBA1C" in the last 72 hours. CBG: Recent Labs  Lab 08/17/21 2006 08/18/21 0734 08/18/21 1138 08/18/21 1631 08/18/21 2106  GLUCAP 178* 154* 206* 179* 191*   Lipid Profile: No results for input(s): "CHOL", "HDL", "LDLCALC", "TRIG", "CHOLHDL", "LDLDIRECT" in the last 72 hours. Thyroid Function Tests: No results for input(s): "TSH", "T4TOTAL", "FREET4", "T3FREE", "THYROIDAB" in the last 72 hours. Anemia Panel: No results for input(s): "VITAMINB12", "FOLATE", "FERRITIN", "TIBC", "IRON", "RETICCTPCT" in the last 72 hours. Sepsis Labs: Recent Labs  Lab 08/14/21 1653 08/14/21 1910  PROCALCITON 0.31  --   LATICACIDVEN 2.5* 2.2*    Recent Results (from the past 240 hour(s))  Blood culture (routine  x 2)     Status: None   Collection Time: 08/14/21  4:53 PM   Specimen: BLOOD  Result Value Ref Range Status   Specimen Description BLOOD [facility]  Final   Special Requests BOTTLES DRAWN AEROBIC AND ANAEROBIC BCLV  Final   Culture   Final    NO GROWTH 5 DAYS Performed at [facility], [address], [place], [place] [address]    Report Status 08/19/2021 FINAL  Final  Blood culture (routine x 2)     Status: None   Collection Time: 08/14/21  4:55 PM   Specimen: BLOOD  Result Value Ref Range Status   Specimen Description BLOOD RIGHT ANTECUBITAL  Final   Special Requests   Final    BOTTLES DRAWN AEROBIC AND ANAEROBIC Blood Culture adequate volume   Culture   Final    NO  GROWTH 5 DAYS Performed at [facility], [address] [place], [place] [address]    Report Status 08/19/2021 FINAL  Final  MRSA Next Gen by PCR, Nasal     Status: None   Collection Time: 08/15/21  5:50 PM   Specimen: Nasal Mucosa; Nasal Swab  Result Value Ref Range Status   MRSA by PCR Next Gen NOT DETECTED NOT DETECTED Final    Comment: (NOTE) The GeneXpert MRSA Assay (FDA approved for NASAL specimens only), is one component of a comprehensive MRSA colonization surveillance program. It is not intended to diagnose MRSA infection nor to guide or monitor treatment for MRSA infections. Test performance is not FDA approved in patients less than 89 years old. Performed at [facility], [address] [place], [place] [address]   Body fluid culture w Gram Stain     Status: None (Preliminary result)   Collection Time: 08/16/21  4:20 PM   Specimen: PATH Cytology Pleural fluid  Result Value Ref Range Status   Specimen Description   Final    PLEURAL Performed at [facility], [address], [place], [place] [address]    Special Requests   Final    PLEURAL Performed at [facility], [address] [place], [place] [address]    Gram Stain   Final    CYTOSPIN  SMEAR WBC PRESENT,BOTH PMN AND MONONUCLEAR NO ORGANISMS SEEN    Culture   Final    NO GROWTH 2 DAYS Performed at [facility], [place] [address], [place], [place] [address]    Report Status PENDING  Incomplete         Radiology Studies: No results found.      Scheduled Meds:  allopurinol  100 mg Oral Daily   atorvastatin  10 mg Oral Daily   budesonide (PULMICORT) nebulizer solution  0.25 mg Nebulization BID   chlorhexidine  15 mL Mouth Rinse BID   diltiazem  60 mg Oral BID   empagliflozin  10 mg Oral Daily   ferrous sulfate  325 mg Oral Q breakfast   fluticasone  2 spray Each Nare Daily   furosemide  40 mg Intravenous BID   insulin aspart  0-20 Units Subcutaneous TID WC   insulin aspart  10 Units Subcutaneous TID WC   insulin glargine-yfgn  25 Units Subcutaneous BID   ipratropium  2 spray Each Nare QID   lidocaine  1 patch Transdermal Q24H   mouth rinse  15 mL Mouth Rinse q12n4p   melatonin  5 mg Oral QHS   montelukast  10 mg Oral QHS   pantoprazole  40 mg Oral Daily   spironolactone  12.5 mg Oral Daily   umeclidinium-vilanterol  1 puff Inhalation Daily   vitamin B-12  500 mcg Oral Daily   Continuous Infusions:  ceFEPime (MAXIPIME) IV 2 g (08/18/21 2029)     LOS: 5 days    Time spent: 25 mins     Wyvonnia Dusky, MD [facility] Pager [phone]  If 7PM-7AM, please contact night-coverage 08/19/2021, 7:35 AM

## 2021-08-19 NOTE — Progress Notes (Signed)
Cardiology Progress Note   Patient Name: Karen [name] Date of Encounter: 08/19/2021  Primary Cardiologist: Ida Rogue, MD  Subjective   Sitting up at bedside.  Remains on HFNC.  Feels like breathing is improved some.  No chest pain or palpitations.  In afib this AM.  Inpatient Medications    Scheduled Meds:  allopurinol  100 mg Oral Daily   atorvastatin  10 mg Oral Daily   budesonide (PULMICORT) nebulizer solution  0.25 mg Nebulization BID   chlorhexidine  15 mL Mouth Rinse BID   diltiazem  60 mg Oral BID   empagliflozin  10 mg Oral Daily   ferrous sulfate  325 mg Oral Q breakfast   fluticasone  2 spray Each Nare Daily   furosemide  40 mg Intravenous BID   insulin aspart  0-20 Units Subcutaneous TID WC   insulin aspart  10 Units Subcutaneous TID WC   insulin glargine-yfgn  25 Units Subcutaneous BID   ipratropium  2 spray Each Nare QID   lidocaine  1 patch Transdermal Q24H   mouth rinse  15 mL Mouth Rinse q12n4p   melatonin  5 mg Oral QHS   montelukast  10 mg Oral QHS   pantoprazole  40 mg Oral Daily   spironolactone  12.5 mg Oral Daily   umeclidinium-vilanterol  1 puff Inhalation Daily   vitamin B-12  500 mcg Oral Daily   Continuous Infusions:  ceFEPime (MAXIPIME) IV 2 g (08/19/21 0926)   PRN Meds: acetaminophen, albuterol, ipratropium-albuterol, morphine injection, oxyCODONE, senna-docusate, traZODone   Vital Signs    Vitals:   08/19/21 0742 08/19/21 0920 08/19/21 1110 08/19/21 1156  BP:  126/69 117/69   Pulse:  (!) 103 (!) 102   Resp:  (!) 22 (!) 26 20  Temp:  98.2 F (36.8 C) 97.9 F (36.6 C)   TempSrc:  Oral Oral   SpO2: 92% 97% 98%   Weight:        Intake/Output Summary (Last 24 hours) at 08/19/2021 1333 Last data filed at 08/19/2021 1100 Gross per 24 hour  Intake 720 ml  Output 1800 ml  Net -1080 ml   Filed Weights   08/17/21 0500 08/18/21 0446 08/19/21 0440  Weight: 93 kg 90.7 kg 92.6 kg    Physical Exam   GEN: Obese, on HFNC.  No  apparent distress.  HEENT: Grossly normal.  Neck: Supple, obese, difficult to gauge JVP.  No carotid bruits or masses. Cardiac: IR, IR, distant, no murmurs, rubs, or gallops. No clubbing, cyanosis, edema.  Radials 2+, DP/PT 1+ and equal bilaterally.  Respiratory:  Respirations regular and unlabored, markedly diminished breath sounds bilat. GI: Obese, soft, nontender, nondistended, BS + x 4. MS: no deformity or atrophy. Skin: warm and dry, no rash. Neuro:  Strength and sensation are intact. Psych: AAOx3.  Normal affect.  Labs    Chemistry Recent Labs  Lab 08/14/21 1518 08/15/21 0558 08/17/21 4656 08/18/21 0553 08/19/21 0606  NA 119*   < > 133* 135 133*  K 4.2   < > 3.7 3.4* 3.5  CL 80*   < > 93* 93* 92*  CO2 25   < > 32 33* 34*  GLUCOSE 458*   < > 179* 142* 135*  BUN 69*   < > 46* 39* 31*  CREATININE 1.81*   < > 1.01* 0.80 0.80  CALCIUM 8.9   < > 8.8* 8.6* 8.7*  PROT 7.1  --   --   --   --  ALBUMIN 3.1*  --   --   --   --   AST 24  --   --   --   --   ALT 20  --   --   --   --   ALKPHOS 91  --   --   --   --   BILITOT 0.9  --   --   --   --   GFRNONAA 28*   < > 57* >60 >60  ANIONGAP 14   < > 8 9 7    < > = values in this interval not displayed.     Hematology Recent Labs  Lab 08/17/21 0632 08/18/21 0553 08/19/21 0606  WBC 14.0* 17.2* 17.0*  RBC 3.71* 3.71* 3.61*  HGB 10.4* 10.6* 10.1*  HCT 32.7* 33.0* 31.6*  MCV 88.1 88.9 87.5  MCH 28.0 28.6 28.0  MCHC 31.8 32.1 32.0  RDW 14.6 14.8 14.8  PLT 327 367 341    Cardiac Enzymes  Recent Labs  Lab 08/03/21 1324 08/03/21 1549 08/14/21 1518 08/14/21 1653  TROPONINIHS 11 10 16 12       BNP    Component Value Date/Time   BNP 85.0 08/14/2021 1530   HbA1c  Lab Results  Component Value Date   HGBA1C 9.1 (H) 08/08/2021    Radiology    US THORACENTESIS ASP PLEURAL SPACE W/IMG GUIDE  Result Date: 08/17/2021 INDICATION: Left pleural effusion EXAM: ULTRASOUND GUIDED LEFT THORACENTESIS MEDICATIONS: None.  COMPLICATIONS: None immediate. PROCEDURE: An ultrasound guided thoracentesis was thoroughly discussed with the patient and questions answered. The benefits, risks, alternatives and complications were also discussed. The patient understands and wishes to proceed with the procedure. Written consent was obtained. Ultrasound was performed to localize and mark an adequate pocket of fluid in the left chest. The area was then prepped and draped in the normal sterile fashion. 1% Lidocaine was used for local anesthesia. Under ultrasound guidance a 19 gauge Yueh catheter was introduced. Thoracentesis was performed. The catheter was removed and a dressing applied. FINDINGS: A total of approximately 850 mL of clear, straw-colored fluid was removed. IMPRESSION: Successful ultrasound guided left thoracentesis yielding 850 mL of clear pleural fluid. Electronically Signed   By: Albin Felling M.D.   On: 08/17/2021 16:25   DG Chest Port 1 View  Result Date: 08/16/2021 CLINICAL DATA:  Post thoracentesis EXAM: PORTABLE CHEST 1 VIEW COMPARISON:  08/15/2021, CT 08/16/2021 FINDINGS: Decreased left pleural effusion post thoracentesis. No pneumothorax. Cardiomegaly with vascular congestion and hazy edema or atelectasis at the bases. Small residual pleural effusions. IMPRESSION: 1. Decreased left effusion post thoracentesis.  No pneumothorax 2. Cardiomegaly with vascular congestion and at least small residual bilateral effusions. Hazy edema or atelectasis at the bases Electronically Signed   By: Donavan Foil M.D.   On: 08/16/2021 17:20   CT CHEST WO CONTRAST  Result Date: 08/16/2021 CLINICAL DATA:  Pneumonia, CHF EXAM: CT CHEST WITHOUT CONTRAST TECHNIQUE: Multidetector CT imaging of the chest was performed following the standard protocol without IV contrast. RADIATION DOSE REDUCTION: This exam was performed according to the departmental dose-optimization program which includes automated exposure control, adjustment of the mA and/or  kV according to patient size and/or use of iterative reconstruction technique. COMPARISON:  Previous CT done on 02/05/2021 and chest radiograph done on 08/15/2021 FINDINGS: Cardiovascular: There is interval appearance of moderate to large pericardial effusion. There are scattered coronary artery calcifications. Mediastinum/Nodes: There are slightly enlarged lymph nodes in the mediastinum with no significant interval change.  Lungs/Pleura: There is interval decrease in size of moderate right pleural effusion. There is large left pleural effusion with significant interval increase. Infiltrates are seen in the posterior aspect of left mid and both lower lung fields, more so on the left side suggesting compression atelectasis and possibly pneumonia. There is no pneumothorax. Upper Abdomen: Unremarkable. Musculoskeletal: Unremarkable. IMPRESSION: There is interval appearance of moderate to large pericardial effusion. Coronary artery calcifications are seen. Moderate to large bilateral pleural effusions, more so on the left side. There is significant interval increase in amount of left pleural effusion. Infiltrates are noted in the posterior left mid and both lower lung fields, more so on the left side suggesting compression atelectasis and possibly underlying pneumonia. Electronically Signed   By: Elmer Picker M.D.   On: 08/16/2021 15:24    Telemetry    Afib, 80-100 - Personally Reviewed  Cardiac Studies   2D Echocardiogram 06.13.2023  1. Left ventricular ejection fraction, by estimation, is 55 to 60%. The  left ventricle has normal function. The left ventricle has no regional  wall motion abnormalities. There is moderate left ventricular hypertrophy.   2. Right ventricular systolic function is moderately reduced. The right  ventricular size is normal. Moderately increased right ventricular wall  thickness. There is severely elevated pulmonary artery systolic pressure.   3. Moderate pericardial  effusion. The pericardial effusion is  circumferential, though the largest fluid collections are inferolateral to  the LV and adjacent to the RA. There is no definite evidence of tamponade  physiology, though evaluation is limited  without mitral/tricuspid valve Doppler waveforms to assess for respiratory  variation.   4. Mild mitral valve regurgitation.   5. Tricuspid valve regurgitation is moderate.   6. The aortic valve has an indeterminant number of cusps. There is  moderate calcification of the aortic valve. There is moderate thickening  of the aortic valve.   7. The inferior vena cava is dilated in size with <50% respiratory  variability, suggesting right atrial pressure of 15 mmHg.  _____________   Patient Profile     76 y.o. female with history of HFpEF, COPD, obesity, respiratory failure, oxygen presenting with shortness of breath and edema, diagnosed with pneumonia and respiratory failure being seen for A-fib RVR and HFpEF.  Assessment & Plan    1.  Acute on chronic HFPEF/PAH:  Minus 280 ml yesterday and minus 3.8L since admission.  Wt recorded as up 2.6 kg this AM.  Wt this admission is similar to prev recorded dry weights.  Pts husband notes that current weight below what they typically see @ home when she is feeling well.  She remains on HFNC, though feels that breathing is improving.  Was only on 3lpm @ home. Body habitus makes exam challenging, though she does not appear to be markedly volume overloaded.  Renal fxn relatively stable, though bicarb cont to bump slightly  33 this AM.  Reasonable to cont IV lasix today, though I suspect we'll be transitioning back to PO diuretics soon.  She went home on 6/4 on torsemide 40mg  daily - perhaps she'll need 40 bid going forward.  Cont jardiance and spiro.  ? Role of more frequent Afib in Ss.  Cont PO dilt - titrate to 60 q8h and consolidate for tomorrow.  2.  Moderate pericardial effusion:  Limited echo 6/13 w/ mod effusion w/o  tamponade. Eliquis on hold in case pericardiocentesis required at some point.  Poor prognosis in setting of severe PAH.  Plan limited echo this  weekend to re-eval effusion following diuresis.  3.  Persistent afib:  Recurrent Afib this AM.  Reasonably rate controlled - 80 to 100.   [place] on hold in the setting of #2.  Titrate dilt and consolidate.  4.  COPD/Acute on chronic resp failure/PNA:  Cont to require HFNC.  Nebs/Abx per primary team.  Signed, Murray Hodgkins, NP  08/19/2021, 1:33 PM    For questions or updates, please contact   Please consult [email or web address] for contact info under Cardiology/STEMI.

## 2021-08-19 NOTE — Evaluation (Signed)
Physical Therapy Evaluation Patient Details Name: Karen [name] MRN: [id number] DOB: Feb 23, 1946 Today's Date: 08/19/2021  History of Present Illness  Karen [name] is a 22yoF who comes to [facility] on 08/14/21 from [facility] for CP. P tgiven SL nitro prior to arrival. Pt Dx with PNA 2 days prior. PMH of CHF, COPD on 3L/min, sleep apnea, CKDII/III, HLD, DM, former tobacco use, PAF on eliquis, pulmonary HTN.  Clinical Impression  Pt admitted with above diagnosis. Pt currently with functional limitations due to the deficits listed below (see "PT Problem List"). Upon entry, pt seated EOB, awake and agreeable to participate. The pt is alert, pleasant, interactive, and able to provide info regarding prior level of function, both in tolerance and independence. Pt now on HHHF 45L/min, 45% FiO2. Pt still at EOB, breakfast tray has been finished. Pt agreeable to attempt going bed to chair, requires physical assist due to weakness, and prolonged time due to falls anxiety and limited confidence. After transfer SpO2 drops to ~87% for 2-3 minutes, but pt appears subjectively recovered much sooner. Patient's performance this date reveals decreased ability, independence, and tolerance in performing all basic mobility required for performance of activities of daily living. Pt requires additional DME, close physical assistance, and cues for safe participate in mobility. Pt remains weak and deconditioned, still very much desires to DC to STR and regain strength prior to returning home. Pt will benefit from skilled PT intervention to increase independence and safety with basic mobility in preparation for discharge to the venue listed below.       Recommendations for follow up therapy are one component of a multi-disciplinary discharge planning process, led by the attending physician.  Recommendations may be updated based on patient status, additional functional criteria and insurance authorization.  Follow Up  Recommendations Skilled nursing-short term rehab (<3 hours/day)    Assistance Recommended at Discharge Intermittent Supervision/Assistance  Patient can return home with the following  A little help with bathing/dressing/bathroom;Assistance with cooking/housework;Assist for transportation;Help with stairs or ramp for entrance;Direct supervision/assist for medications management;A little help with walking and/or transfers    Equipment Recommendations None recommended by PT  Recommendations for Other Services       Functional Status Assessment Patient has had a recent decline in their functional status and demonstrates the ability to make significant improvements in function in a reasonable and predictable amount of time.     Precautions / Restrictions Precautions Precautions: Fall Restrictions Weight Bearing Restrictions: No      Mobility  Bed Mobility               General bed mobility comments: seated EOB on entry    Transfers Overall transfer level: Needs assistance Equipment used: Rolling walker (2 wheels) Transfers: Sit to/from Stand, Bed to chair/wheelchair/BSC Sit to Stand: Min assist Stand pivot transfers: Min assist              Ambulation/Gait Ambulation/Gait assistance:  (deferred due to respiratory status desaturation)                Stairs            Wheelchair Mobility    Modified Rankin (Stroke Patients Only)       Balance                                             Pertinent Vitals/Pain Pain  Assessment Pain Assessment: Faces Faces Pain Scale: Hurts little more Pain Location: lower chest Pain Intervention(s): Limited activity within patient's tolerance, Monitored during session, Premedicated before session    [place] expects to be discharged to:: Private residence Living Arrangements: Spouse/significant other Available Help at Discharge: Family;Available 24 hours/day Type of Home:  Mobile home Home Access: Ramped entrance       Home Layout: One level Home Equipment: Grab bars - toilet;Grab bars - tub/shower;Rollator (4 wheels);Rolling Walker (2 wheels);BSC/3in1;Wheelchair - manual      Prior Function Prior Level of Function : Needs assist       Physical Assist : ADLs (physical)   ADLs (physical): Bathing;Dressing Mobility Comments: Mod Ind amb with a rollator household distances only, w/c in the community, no fall history ADLs Comments: Pt.'s husband assists pt, with tub/shower transfers, LE dressining, meal preparation, and medication management     Hand Dominance   Dominant Hand: Right    Extremity/Trunk Assessment   Upper Extremity Assessment Upper Extremity Assessment: Overall WFL for tasks assessed    Lower Extremity Assessment Lower Extremity Assessment: Overall WFL for tasks assessed    Cervical / Trunk Assessment Cervical / Trunk Assessment: Normal  Communication   Communication: No difficulties  Cognition Arousal/Alertness: Awake/alert Behavior During Therapy: WFL for tasks assessed/performed Overall Cognitive Status: Within Functional Limits for tasks assessed                                          General Comments      Exercises     Assessment/Plan    PT Assessment Patient needs continued PT services  PT Problem List Decreased activity tolerance;Decreased mobility;Decreased balance;Cardiopulmonary status limiting activity       PT Treatment Interventions DME instruction;Therapeutic exercise;Gait training;Balance training;Stair training;Neuromuscular re-education;Functional mobility training;Therapeutic activities;Patient/family education    PT Goals (Current goals can be found in the Care Plan section)  Acute Rehab PT Goals Patient Stated Goal: to breathe better PT Goal Formulation: With patient Time For Goal Achievement: 09/02/21 Potential to Achieve Goals: Good    Frequency Min 2X/week      Co-evaluation               AM-PAC PT "6 Clicks" Mobility  Outcome Measure Help needed turning from your back to your side while in a flat bed without using bedrails?: A Lot Help needed moving from lying on your back to sitting on the side of a flat bed without using bedrails?: A Lot Help needed moving to and from a bed to a chair (including a wheelchair)?: A Lot Help needed standing up from a chair using your arms (e.g., wheelchair or bedside chair)?: A Lot Help needed to walk in hospital room?: A Lot Help needed climbing 3-5 steps with a railing? : Total 6 Click Score: 11    End of Session Equipment Utilized During Treatment: Oxygen Activity Tolerance: Patient limited by fatigue;No increased pain Patient left: with call bell/phone within reach;with chair alarm set;in chair Nurse Communication: Mobility status PT Visit Diagnosis: Other abnormalities of gait and mobility (R26.89);Difficulty in walking, not elsewhere classified (R26.2)    Time: 4098-1191 PT Time Calculation (min) (ACUTE ONLY): 20 min   Charges:   PT Evaluation $PT Eval Moderate Complexity: 1 Mod         9:55 AM, 08/19/21 [name], PT, DPT Physical Therapist - [facility]  [facility]  [phone] ([place])    Youssouf Shipley C 08/19/2021, 9:52 AM

## 2021-08-19 NOTE — Progress Notes (Addendum)
OT Cancellation Note  Patient Details Name: Karen [name] MRN: [id number] DOB: [birth year]   Cancelled Treatment:    Reason Eval/Treat Not Completed: Patient declined, no reason specified. Consult received, chart reviewed. MD exiting pt's room upon OT's arrival. Reporting pt feeling SOB and RN to get medication for her but MD agreeable to OT attempting. Pt endorsing significant SOB, 9/10 chest pain underneath L breast. Pt instructed in PLB which pt endorsed did help a little. SpO2 improved from 87% to 96% quickly with PLB. Will re-attempt OT evaluation at later time as pt is more appropriate for additional exertional activity. Pt recently worked with PT.   Addendum, 11:51am: On 2nd attempt, pt back in bed and declines OT eval 2/2 fatigue and not feeling well. Agreeable to re-attempt next date.  Ardeth Perfect., MPH, MS, OTR/L ascom [phone] 08/19/21, 9:54 AM

## 2021-08-19 NOTE — Significant Event (Signed)
Hypoglycemic Event  CBG: 58  Treatment: 8 oz juice/soda  Symptoms: None  Follow-up CBG: Time: 2120 CBG Result: 85   Possible Reasons for Event: Unknown  Comments/MD notified:protocol followed     Karen [name]

## 2021-08-20 ENCOUNTER — Inpatient Hospital Stay (HOSPITAL_COMMUNITY)
Admit: 2021-08-20 | Discharge: 2021-08-20 | Disposition: A | Discharge status: Home / Self Care | Payer: Medicare Other | Attending: Nurse Practitioner | Admitting: Nurse Practitioner

## 2021-08-20 DIAGNOSIS — I3139 Other pericardial effusion (noninflammatory): Secondary | ICD-10-CM | POA: Diagnosis not present

## 2021-08-20 DIAGNOSIS — J9621 Acute and chronic respiratory failure with hypoxia: Secondary | ICD-10-CM | POA: Diagnosis not present

## 2021-08-20 DIAGNOSIS — I5033 Acute on chronic diastolic (congestive) heart failure: Secondary | ICD-10-CM | POA: Diagnosis not present

## 2021-08-20 DIAGNOSIS — I4821 Permanent atrial fibrillation: Secondary | ICD-10-CM | POA: Diagnosis not present

## 2021-08-20 DIAGNOSIS — J432 Centrilobular emphysema: Secondary | ICD-10-CM | POA: Diagnosis not present

## 2021-08-20 LAB — CBC
HCT: 32.6 % — ABNORMAL LOW (ref 36.0–46.0)
Hemoglobin: 10.3 g/dL — ABNORMAL LOW (ref 12.0–15.0)
MCH: 28.2 pg (ref 26.0–34.0)
MCHC: 31.6 g/dL (ref 30.0–36.0)
MCV: 89.3 fL (ref 80.0–100.0)
Platelets: 340 10*3/uL (ref 150–400)
RBC: 3.65 MIL/uL — ABNORMAL LOW (ref 3.87–5.11)
RDW: 14.9 % (ref 11.5–15.5)
WBC: 17 10*3/uL — ABNORMAL HIGH (ref 4.0–10.5)
nRBC: 0 % (ref 0.0–0.2)

## 2021-08-20 LAB — GLUCOSE, CAPILLARY
Glucose-Capillary: 146 mg/dL — ABNORMAL HIGH (ref 70–99)
Glucose-Capillary: 184 mg/dL — ABNORMAL HIGH (ref 70–99)
Glucose-Capillary: 190 mg/dL — ABNORMAL HIGH (ref 70–99)
Glucose-Capillary: 194 mg/dL — ABNORMAL HIGH (ref 70–99)
Glucose-Capillary: 290 mg/dL — ABNORMAL HIGH (ref 70–99)

## 2021-08-20 LAB — BODY FLUID CULTURE W GRAM STAIN: Culture: NO GROWTH

## 2021-08-20 LAB — ECHOCARDIOGRAM LIMITED
S' Lateral: 3.82 cm
Weight: 3286.4 oz

## 2021-08-20 LAB — BASIC METABOLIC PANEL
Anion gap: 9 (ref 5–15)
BUN: 34 mg/dL — ABNORMAL HIGH (ref 8–23)
CO2: 35 mmol/L — ABNORMAL HIGH (ref 22–32)
Calcium: 8.6 mg/dL — ABNORMAL LOW (ref 8.9–10.3)
Chloride: 91 mmol/L — ABNORMAL LOW (ref 98–111)
Creatinine, Ser: 1.02 mg/dL — ABNORMAL HIGH (ref 0.44–1.00)
GFR, Estimated: 57 mL/min — ABNORMAL LOW (ref >60)
Glucose, Bld: 195 mg/dL — ABNORMAL HIGH (ref 70–99)
Potassium: 4.4 mmol/L (ref 3.5–5.1)
Sodium: 135 mmol/L (ref 135–145)

## 2021-08-20 MED ORDER — TORSEMIDE 20 MG PO TABS
40.0000 mg | ORAL_TABLET | Freq: Two times a day (BID) | ORAL | Status: DC
  Administered 2021-08-20 – 2021-08-25 (×11): 40 mg via ORAL
  Filled 2021-08-20 (×11): qty 2

## 2021-08-20 MED ORDER — APIXABAN 5 MG PO TABS
5.0000 mg | ORAL_TABLET | Freq: Two times a day (BID) | ORAL | Status: DC
  Administered 2021-08-20 – 2021-09-07 (×37): 5 mg via ORAL
  Filled 2021-08-20 (×37): qty 1

## 2021-08-20 MED ORDER — DILTIAZEM HCL ER COATED BEADS 180 MG PO CP24
180.0000 mg | ORAL_CAPSULE | Freq: Every day | ORAL | Status: DC
Start: 2021-08-21 — End: 2021-08-29
  Administered 2021-08-21 – 2021-08-29 (×9): 180 mg via ORAL
  Filled 2021-08-20 (×9): qty 1

## 2021-08-20 NOTE — Progress Notes (Signed)
Cardiology Progress Note   Patient Karen [name] Date of Encounter: 08/20/2021  Primary Cardiologist: Ida Rogue, MD  Subjective   Feels like breathing cont to improve.  Remains on HFNC.  In sinus w/ intermittent jxnl rhythm this AM. Asymptomatic.  Inpatient Medications    Scheduled Meds:  allopurinol  100 mg Oral Daily   atorvastatin  10 mg Oral Daily   budesonide (PULMICORT) nebulizer solution  0.25 mg Nebulization BID   chlorhexidine  15 mL Mouth Rinse BID   diltiazem  60 mg Oral Q8H   empagliflozin  10 mg Oral Daily   ferrous sulfate  325 mg Oral Q breakfast   fluticasone  2 spray Each Nare Daily   furosemide  40 mg Intravenous BID   insulin aspart  0-20 Units Subcutaneous TID WC   insulin aspart  10 Units Subcutaneous TID WC   insulin glargine-yfgn  25 Units Subcutaneous BID   ipratropium  2 spray Each Nare QID   lidocaine  1 patch Transdermal Q24H   mouth rinse  15 mL Mouth Rinse q12n4p   melatonin  5 mg Oral QHS   montelukast  10 mg Oral QHS   pantoprazole  40 mg Oral Daily   spironolactone  12.5 mg Oral Daily   umeclidinium-vilanterol  1 puff Inhalation Daily   vitamin B-12  500 mcg Oral Daily   Continuous Infusions:  ceFEPime (MAXIPIME) IV 2 g (08/19/21 2059)   PRN Meds: acetaminophen, albuterol, ipratropium-albuterol, morphine injection, oxyCODONE, senna-docusate, traZODone   Vital Signs    Vitals:   08/19/21 2020 08/19/21 2031 08/20/21 0032 08/20/21 0553  BP: (!) 120/59  117/64 (!) 111/43  Pulse: 66  85 65  Resp: 20  (!) 24 (!) 25  Temp: 98.1 F (36.7 C)  97.8 F (36.6 C) 97.9 F (36.6 C)  TempSrc: Oral  Oral Oral  SpO2: 93% 93% 94% 93%  Weight:    93.2 kg    Intake/Output Summary (Last 24 hours) at 08/20/2021 0755 Last data filed at 08/20/2021 0500 Gross per 24 hour  Intake 720 ml  Output 1900 ml  Net -1180 ml   Filed Weights   08/18/21 0446 08/19/21 0440 08/20/21 0553  Weight: 90.7 kg 92.6 kg 93.2 kg    Physical Exam    GEN: Obese, on HFNC.  No apparent distress.  HEENT: Grossly normal.  Neck: Supple, obese, difficult to gauge JVP.  No carotid bruits or masses. Cardiac: RRR, distant, no murmurs, rubs, or gallops. No clubbing, cyanosis, edema.  Radials 2+, DP/PT 1+ and equal bilaterally.  Respiratory:  Respirations regular and unlabored, diminished breath sounds bilat. GI: Obese, soft, nontender, nondistended, BS + x 4. MS: no deformity or atrophy. Skin: warm and dry, no rash. Neuro:  Strength and sensation are intact. Psych: AAOx3.  Normal affect.  Labs    Chemistry Recent Labs  Lab 08/14/21 1518 08/15/21 0558 08/18/21 0553 08/19/21 0606 08/20/21 0435  NA 119*   < > 135 133* 135  K 4.2   < > 3.4* 3.5 4.4  CL 80*   < > 93* 92* 91*  CO2 25   < > 33* 34* 35*  GLUCOSE 458*   < > 142* 135* 195*  BUN 69*   < > 39* 31* 34*  CREATININE 1.81*   < > 0.80 0.80 1.02*  CALCIUM 8.9   < > 8.6* 8.7* 8.6*  PROT 7.1  --   --   --   --  ALBUMIN 3.1*  --   --   --   --   AST 24  --   --   --   --   ALT 20  --   --   --   --   ALKPHOS 91  --   --   --   --   BILITOT 0.9  --   --   --   --   GFRNONAA 28*   < > >60 >60 57*  ANIONGAP 14   < > 9 7 9    < > = values in this interval not displayed.     Hematology Recent Labs  Lab 08/18/21 0553 08/19/21 0606 08/20/21 0435  WBC 17.2* 17.0* 17.0*  RBC 3.71* 3.61* 3.65*  HGB 10.6* 10.1* 10.3*  HCT 33.0* 31.6* 32.6*  MCV 88.9 87.5 89.3  MCH 28.6 28.0 28.2  MCHC 32.1 32.0 31.6  RDW 14.8 14.8 14.9  PLT 367 341 340    Cardiac Enzymes  Recent Labs  Lab 08/03/21 1324 08/03/21 1549 08/14/21 1518 08/14/21 1653  TROPONINIHS 11 10 16 12       BNP    Component Value Date/Time   BNP 85.0 08/14/2021 1530   HbA1c  Lab Results  Component Value Date   HGBA1C 9.1 (H) 08/08/2021    Radiology    US THORACENTESIS ASP PLEURAL SPACE W/IMG GUIDE  Result Date: 08/17/2021 INDICATION: Left pleural effusion EXAM: ULTRASOUND GUIDED LEFT THORACENTESIS  MEDICATIONS: None. COMPLICATIONS: None immediate. PROCEDURE: An ultrasound guided thoracentesis was thoroughly discussed with the patient and questions answered. The benefits, risks, alternatives and complications were also discussed. The patient understands and wishes to proceed with the procedure. Written consent was obtained. Ultrasound was performed to localize and mark an adequate pocket of fluid in the left chest. The area was then prepped and draped in the normal sterile fashion. 1% Lidocaine was used for local anesthesia. Under ultrasound guidance a 19 gauge Yueh catheter was introduced. Thoracentesis was performed. The catheter was removed and a dressing applied. FINDINGS: A total of approximately 850 mL of clear, straw-colored fluid was removed. IMPRESSION: Successful ultrasound guided left thoracentesis yielding 850 mL of clear pleural fluid. Electronically Signed   By: Albin Felling M.D.   On: 08/17/2021 16:25   DG Chest Port 1 View  Result Date: 08/16/2021 CLINICAL DATA:  Post thoracentesis EXAM: PORTABLE CHEST 1 VIEW COMPARISON:  08/15/2021, CT 08/16/2021 FINDINGS: Decreased left pleural effusion post thoracentesis. No pneumothorax. Cardiomegaly with vascular congestion and hazy edema or atelectasis at the bases. Small residual pleural effusions. IMPRESSION: 1. Decreased left effusion post thoracentesis.  No pneumothorax 2. Cardiomegaly with vascular congestion and at least small residual bilateral effusions. Hazy edema or atelectasis at the bases Electronically Signed   By: Donavan Foil M.D.   On: 08/16/2021 17:20   CT CHEST WO CONTRAST  Result Date: 08/16/2021 CLINICAL DATA:  Pneumonia, CHF EXAM: CT CHEST WITHOUT CONTRAST TECHNIQUE: Multidetector CT imaging of the chest was performed following the standard protocol without IV contrast. RADIATION DOSE REDUCTION: This exam was performed according to the departmental dose-optimization program which includes automated exposure control,  adjustment of the mA and/or kV according to patient size and/or use of iterative reconstruction technique. COMPARISON:  Previous CT done on 02/05/2021 and chest radiograph done on 08/15/2021 FINDINGS: Cardiovascular: There is interval appearance of moderate to large pericardial effusion. There are scattered coronary artery calcifications. Mediastinum/Nodes: There are slightly enlarged lymph nodes in the mediastinum with no significant interval change.  Lungs/Pleura: There is interval decrease in size of moderate right pleural effusion. There is large left pleural effusion with significant interval increase. Infiltrates are seen in the posterior aspect of left mid and both lower lung fields, more so on the left side suggesting compression atelectasis and possibly pneumonia. There is no pneumothorax. Upper Abdomen: Unremarkable. Musculoskeletal: Unremarkable. IMPRESSION: There is interval appearance of moderate to large pericardial effusion. Coronary artery calcifications are seen. Moderate to large bilateral pleural effusions, more so on the left side. There is significant interval increase in amount of left pleural effusion. Infiltrates are noted in the posterior left mid and both lower lung fields, more so on the left side suggesting compression atelectasis and possibly underlying pneumonia. Electronically Signed   By: Elmer Picker M.D.   On: 08/16/2021 15:24    Telemetry    RSR and junctional rhythm w/ rates in the 60's to 70's- Personally Reviewed  Cardiac Studies   2D Echocardiogram 06.13.2023  1. Left ventricular ejection fraction, by estimation, is 55 to 60%. The  left ventricle has normal function. The left ventricle has no regional  wall motion abnormalities. There is moderate left ventricular hypertrophy.   2. Right ventricular systolic function is moderately reduced. The right  ventricular size is normal. Moderately increased right ventricular wall  thickness. There is severely  elevated pulmonary artery systolic pressure.   3. Moderate pericardial effusion. The pericardial effusion is  circumferential, though the largest fluid collections are inferolateral to  the LV and adjacent to the RA. There is no definite evidence of tamponade  physiology, though evaluation is limited  without mitral/tricuspid valve Doppler waveforms to assess for respiratory  variation.   4. Mild mitral valve regurgitation.   5. Tricuspid valve regurgitation is moderate.   6. The aortic valve has an indeterminant number of cusps. There is  moderate calcification of the aortic valve. There is moderate thickening  of the aortic valve.   7. The inferior vena cava is dilated in size with <50% respiratory  variability, suggesting right atrial pressure of 15 mmHg.  _____________   Patient Profile     76 y.o. female with history of HFpEF, COPD, obesity, respiratory failure, oxygen presenting with shortness of breath and edema, diagnosed with pneumonia and respiratory failure being seen for A-fib RVR and HFpEF.  Assessment & Plan    1.  Acute on chronic HFPEF/PAH:  Minus 1.1L yesterday and minus 4.8L since admission.  Despite net neg, wt recorded as higher today, though this is a bedscale weight.  As prev noted, wt this admission is similar to prev recorded dry weights and lower than what husband reports as dry weight at home.  She remains on HFNC, though feels that breathing is improving.  Was only on 3lpm @ home. Body habitus makes exam challenging, though she does not appear to be markedly volume overloaded.  BUN/Creat cont to rise slightly. @ 34/1.02 w/ a bicarb of 35.  I will switch IV lasix to torsemide 40mg  bid (prev home dose was 40 daily).  Cont jardiance and spiro.  ? Role of more frequent Afib in Ss - currently in sinus/junctional.  Rates stable on dilt 60 q8h.  Plan to consolidate for tomorrow.  2.  Moderate pericardial effusion:  Limited echo 6/13 w/ mod effusion w/o tamponade. Eliquis  on hold in case pericardiocentesis required at some point.  Poor prognosis in setting of severe PAH.  Plan limited today.  If stable, plan to resume eliquis.  3.  Persistent  afib:  Back in sinus w/ junctional rhythm as well.  Rates currently 60's to 70's.  Cont dilt and plan to transition to dilt cd 180 mg daily starting tomorrow.  [place] on hold in the setting of #2 - limited echo today to reeval pericardial effusion.  If stable, will resume eliquis.  4.  COPD/Acute on chronic resp failure/PNA:  Cont to require HFNC.  Nebs/Abx per primary team.  Signed, Murray Hodgkins, NP  08/20/2021, 7:55 AM    For questions or updates, please contact   Please consult [email or web address] for contact info under Cardiology/STEMI.

## 2021-08-20 NOTE — Progress Notes (Signed)
*  PRELIMINARY RESULTS* Echocardiogram 2D Echocardiogram has been performed. A Limited Echo was requested and performed.  Claretta Fraise 08/20/2021, 11:03 AM

## 2021-08-20 NOTE — Progress Notes (Signed)
At bedside to place PIV per consult.  Pt sitting on side of bed and requesting to come back later after breakfast.  Claiborne Billings RN aware

## 2021-08-20 NOTE — Progress Notes (Signed)
PROGRESS NOTE    Karen [name]  YTK:[id number] DOB: 08/07/1945 DOA: 08/14/2021 PCP: Margarita Rana, MD    Assessment & Plan:   Principal Problem:   HAP (hospital-acquired pneumonia) Active Problems:   Acute on chronic heart failure with preserved ejection fraction (HFpEF) (HCC)   Hyponatremia   Sepsis due to pneumonia ([place])   Chronic respiratory failure with hypoxia (HCC)   Hypokalemia   Uncontrolled type 2 diabetes mellitus with hyperglycemia, with long-term current use of insulin (HCC)   AF (paroxysmal atrial fibrillation) (HCC)   Acute kidney injury superimposed on CKD (HCC)   OSA (obstructive sleep apnea)   COPD (chronic obstructive pulmonary disease) (HCC)   Chest pain   Gastroesophageal reflux disease without esophagitis   Pericardial effusion   Permanent atrial fibrillation ([place])  Assessment and Plan: Sepsis: secondary to hospital-acquired pneumonia. Was recently hospitalized here and was discharged to skilled nursing facility on 08/10/2021/. Continue on IV cefepime   Acute on chronic hypoxic respiratory failure: still on HFNC & unable to wean down so far. Repeat CXR in AM. Uses 3L [place] at home. Increased shortness of breath today. Morphine prn. Has hx of CHF, COPD, OSA, pulmonary hypertension. BiPAP prn.     Acute on chronic diastolic CHF: echo done on 08/04/2021 showed normal EF, grade III diastolic dysfunction, TR.Continue on torsemide, aldactone as per cardio  COPD exacerbation: continue on supplemental oxygen, bronchodilators & encourage incentive spirometry   PAF: continue on cardizem & restart eliquis as per cardio   B/l pleural effusions: L>R. S/p L thoracentesis w/ 843mL of clear fluid removed on 08/16/21. Continue on torsemide, aldactone   Pericardial effusion: w/o cardiac tamponade physiology as per cardio. Repeat limited echo showed small pericardial effusion present still    Chronic hyponatremia: likely contributed to by hypervolemic hyponatremia from CHF  and also hyperglycemia. WNL today   DM2: HbA1c 9.1, poorly controlled. Continue on glargine, aspart & SSI w/ accuchecks    Hypokalemia:WNL today    OSA: CPAP qhs     GERD: continue on PPI    Chest pain:  EKG showed sinus tachycardia.  Chest wall was tender on palpitation.  Most likely atypical chest pain.  Troponins are negative. Resolved    AKI on CKD stage IIIa:  Cr is trending up from day prior. Avoid nephrotoxic meds    Morbid obesity: BMI 35.4. Complicates overall care & prognosis        DVT prophylaxis: SCDs Code Status:  DNR Family Communication: discussed pt's care w/ pt's husband, Ray, and answered his questions  Disposition Plan: PT recs SNF   Level of care: Progressive  Status is: Inpatient Remains inpatient appropriate because: severity of illness, still on HFNC     Consultants:  Cardio  Palliative care   Procedures:   Antimicrobials: cefepime   Subjective: Pt c/o fatigue and shortness of breath   Objective: Vitals:   08/19/21 2020 08/19/21 2031 08/20/21 0032 08/20/21 0553  BP: (!) 120/59  117/64 (!) 111/43  Pulse: 66  85 65  Resp: 20  (!) 24 (!) 25  Temp: 98.1 F (36.7 C)  97.8 F (36.6 C) 97.9 F (36.6 C)  TempSrc: Oral  Oral Oral  SpO2: 93% 93% 94% 93%  Weight:    93.2 kg    Intake/Output Summary (Last 24 hours) at 08/20/2021 0740 Last data filed at 08/20/2021 0500 Gross per 24 hour  Intake 720 ml  Output 1900 ml  Net -1180 ml   [employer]  08/18/21 0446 08/19/21 0440 08/20/21 0553  Weight: 90.7 kg 92.6 kg 93.2 kg    Examination:  General exam: Appears uncomfortable   Respiratory system: decreased breath sounds b/l  Cardiovascular system: irregularly irregular. No gallops or rubs    Gastrointestinal system: Abd is soft, NT, obese, hypoactive bowel sounds   Central nervous system: alert and oriented. Moves all extremities  Psychiatry: judgement and insight appears normal. Flat mood and affect     Data Reviewed: I have  personally reviewed following labs and imaging studies  CBC: Recent Labs  Lab 08/14/21 1518 08/15/21 0558 08/16/21 0525 08/17/21 2542 08/18/21 0553 08/19/21 0606 08/20/21 0435  WBC 25.1*   < > 25.1* 14.0* 17.2* 17.0* 17.0*  NEUTROABS 22.1*  --   --   --   --   --   --   HGB 11.0*   < > 11.1* 10.4* 10.6* 10.1* 10.3*  HCT 33.6*   < > 34.3* 32.7* 33.0* 31.6* 32.6*  MCV 86.4   < > 87.3 88.1 88.9 87.5 89.3  PLT 346   < > 371 327 367 341 340   < > = values in this interval not displayed.   Basic Metabolic Panel: Recent Labs  Lab 08/16/21 0525 08/17/21 0632 08/18/21 0553 08/19/21 0606 08/20/21 0435  NA 130* 133* 135 133* 135  K 3.4* 3.7 3.4* 3.5 4.4  CL 90* 93* 93* 92* 91*  CO2 30 32 33* 34* 35*  GLUCOSE 151* 179* 142* 135* 195*  BUN 44* 46* 39* 31* 34*  CREATININE 1.04* 1.01* 0.80 0.80 1.02*  CALCIUM 9.0 8.8* 8.6* 8.7* 8.6*   GFR: Estimated Creatinine Clearance: 50.1 mL/min (A) (by C-G formula based on SCr of 1.02 mg/dL (H)). Liver Function Tests: Recent Labs  Lab 08/14/21 1518  AST 24  ALT 20  ALKPHOS 91  BILITOT 0.9  PROT 7.1  ALBUMIN 3.1*   No results for input(s): "LIPASE", "AMYLASE" in the last 168 hours. No results for input(s): "AMMONIA" in the last 168 hours. Coagulation Profile: No results for input(s): "INR", "PROTIME" in the last 168 hours. Cardiac Enzymes: No results for input(s): "CKTOTAL", "CKMB", "CKMBINDEX", "TROPONINI" in the last 168 hours. BNP (last 3 results) No results for input(s): "PROBNP" in the last 8760 hours. HbA1C: No results for input(s): "HGBA1C" in the last 72 hours. CBG: Recent Labs  Lab 08/19/21 0859 08/19/21 1115 08/19/21 1633 08/19/21 2100 08/19/21 2117  GLUCAP 186* 383* 143* 58* 85   Lipid Profile: No results for input(s): "CHOL", "HDL", "LDLCALC", "TRIG", "CHOLHDL", "LDLDIRECT" in the last 72 hours. Thyroid Function Tests: No results for input(s): "TSH", "T4TOTAL", "FREET4", "T3FREE", "THYROIDAB" in the last 72  hours. Anemia Panel: No results for input(s): "VITAMINB12", "FOLATE", "FERRITIN", "TIBC", "IRON", "RETICCTPCT" in the last 72 hours. Sepsis Labs: Recent Labs  Lab 08/14/21 1653 08/14/21 1910  PROCALCITON 0.31  --   LATICACIDVEN 2.5* 2.2*    Recent Results (from the past 240 hour(s))  Blood culture (routine x 2)     Status: None   Collection Time: 08/14/21  4:53 PM   Specimen: BLOOD  Result Value Ref Range Status   Specimen Description BLOOD [facility]  Final   Special Requests BOTTLES DRAWN AEROBIC AND ANAEROBIC BCLV  Final   Culture   Final    NO GROWTH 5 DAYS Performed at [facility], [address] [place], [place] [address]    Report Status 08/19/2021 FINAL  Final  Blood culture (routine x 2)  Status: None   Collection Time: 08/14/21  4:55 PM   Specimen: BLOOD  Result Value Ref Range Status   Specimen Description BLOOD RIGHT ANTECUBITAL  Final   Special Requests   Final    BOTTLES DRAWN AEROBIC AND ANAEROBIC Blood Culture adequate volume   Culture   Final    NO GROWTH 5 DAYS Performed at [facility], [address], [place], [place] [address]    Report Status 08/19/2021 FINAL  Final  MRSA Next Gen by PCR, Nasal     Status: None   Collection Time: 08/15/21  5:50 PM   Specimen: Nasal Mucosa; Nasal Swab  Result Value Ref Range Status   MRSA by PCR Next Gen NOT DETECTED NOT DETECTED Final    Comment: (NOTE) The GeneXpert MRSA Assay (FDA approved for NASAL specimens only), is one component of a comprehensive MRSA colonization surveillance program. It is not intended to diagnose MRSA infection nor to guide or monitor treatment for MRSA infections. Test performance is not FDA approved in patients less than 52 years old. Performed at [facility], [address] [place], [place] [address]   Body fluid culture w Gram Stain     Status: None (Preliminary result)   Collection Time: 08/16/21  4:20 PM   Specimen: PATH Cytology Pleural fluid   Result Value Ref Range Status   Specimen Description   Final    PLEURAL Performed at [facility], [address] [place], [place] [address]    Special Requests   Final    PLEURAL Performed at [facility], [address] [place], [place] [address]    Gram Stain   Final    CYTOSPIN SMEAR WBC PRESENT,BOTH PMN AND MONONUCLEAR NO ORGANISMS SEEN    Culture   Final    NO GROWTH 3 DAYS Performed at [facility], [place] [address] [place], [place] [address]    Report Status PENDING  Incomplete         Radiology Studies: No results found.      Scheduled Meds:  allopurinol  100 mg Oral Daily   atorvastatin  10 mg Oral Daily   budesonide (PULMICORT) nebulizer solution  0.25 mg Nebulization BID   chlorhexidine  15 mL Mouth Rinse BID   diltiazem  60 mg Oral Q8H   empagliflozin  10 mg Oral Daily   ferrous sulfate  325 mg Oral Q breakfast   fluticasone  2 spray Each Nare Daily   furosemide  40 mg Intravenous BID   insulin aspart  0-20 Units Subcutaneous TID WC   insulin aspart  10 Units Subcutaneous TID WC   insulin glargine-yfgn  25 Units Subcutaneous BID   ipratropium  2 spray Each Nare QID   lidocaine  1 patch Transdermal Q24H   mouth rinse  15 mL Mouth Rinse q12n4p   melatonin  5 mg Oral QHS   montelukast  10 mg Oral QHS   pantoprazole  40 mg Oral Daily   spironolactone  12.5 mg Oral Daily   umeclidinium-vilanterol  1 puff Inhalation Daily   vitamin B-12  500 mcg Oral Daily   Continuous Infusions:  ceFEPime (MAXIPIME) IV 2 g (08/19/21 2059)     LOS: 6 days    Time spent: 25 mins     Wyvonnia Dusky, MD [facility] Pager [phone]  If 7PM-7AM, please contact night-coverage 08/20/2021, 7:40 AM

## 2021-08-20 NOTE — Evaluation (Signed)
Occupational Therapy Evaluation Patient Details Name: KORAL THADEN MRN: [id number] DOB: Feb 21, 1946 Today's Date: 08/20/2021   History of Present Illness Dhanvi Boesen is a 15yoF who [facility] 08/14[facility]21 from [facility] for [facility]. P tgiven SL nitro prior to arrival. Pt Dx with PNA 2 days prior. PMH of CHF, COPD on 3L/min, sleep apnea, CKDII/III, HLD, DM, former tobacco use, PAF on eliquis, pulmonary HTN.   Clinical Impression   Chart reviewed, RN cleared pt for participation in OT evaluation. Home set up and PLOF remains the same per husband report as previous admission. Pt performs bed mobility with MIN A, grooming at edge of bed with SET UP, LB dressing with MAX A. Pt encouraged to stand however pt reports she feels increasingly week and defers standing or functional transfer on this date. Spo2 down to 86% with seated kicks in bed via HFNC. Pt is motivated to return to SNF to facilitate return to PLOF. Recommend discharge to STR to address deficits. Pt is left in bed, NAD, all needs met. OT will follow acutely.      Recommendations for follow up therapy are one component of a multi-disciplinary discharge planning process, led by the attending physician.  Recommendations may be updated based on patient status, additional functional criteria and insurance authorization.   Follow Up Recommendations  Skilled nursing-short term rehab (<3 hours/day)    Assistance Recommended at Discharge Frequent or constant Supervision/Assistance  Patient can return home with the following A lot of help with bathing/dressing/bathroom;A little help with walking and/or transfers;Assistance with cooking/housework;Direct supervision/assist for medications management    Functional Status Assessment  Patient has had a recent decline in their functional status and demonstrates the ability to make significant improvements in function in a reasonable and predictable amount of time.  Equipment Recommendations   Other (comment) (defer)    Recommendations for Other Services       Precautions / Restrictions Precautions Precautions: Fall Restrictions Weight Bearing Restrictions: No      Mobility Bed Mobility Overal bed mobility: Needs Assistance Bed Mobility: Supine to Sit, Sit to Supine     Supine to sit: Min assist, HOB elevated Sit to supine: Min assist, HOB elevated   General bed mobility comments: pt reports significant fatigue, declines further mobility tasks on this date    Transfers                          Balance Overall balance assessment: Needs assistance Sitting-balance support: Feet supported Sitting balance-Leahy Scale: Good                                     ADL either performed or assessed with clinical judgement   ADL Overall ADL's : Needs assistance/impaired     Grooming: Sitting;Set up;Wash/dry face               Lower Body Dressing: Maximal assistance;Bed level                       Vision Patient Visual Report: No change from baseline       Perception     Praxis      Pertinent Vitals/Pain Pain Assessment Pain Assessment: Faces Faces Pain Scale: Hurts a little bit Pain Location: genealized Pain Descriptors / Indicators: Grimacing Pain Intervention(s): Limited activity within patient's tolerance, Monitored during session  Hand Dominance     Extremity/Trunk Assessment Upper Extremity Assessment Upper Extremity Assessment: Overall WFL for tasks assessed   Lower Extremity Assessment Lower Extremity Assessment: Overall WFL for tasks assessed   Cervical / Trunk Assessment Cervical / Trunk Assessment: Normal   Communication Communication Communication: No difficulties   Cognition Arousal/Alertness: Awake/alert Behavior During Therapy: WFL for tasks assessed/performed Overall Cognitive Status: Within Functional Limits for tasks assessed                                        General Comments  spo2 >88% on HFNC    Exercises     Shoulder Instructions      Home Living Family/patient expects to be discharged to:: Private residence Living Arrangements: Spouse/significant other Available Help at Discharge: Family;Available 24 hours/day Type of Home: Mobile home Home Access: Ramped entrance     Home Layout: One level     Bathroom Shower/Tub: Teacher, early years/pre: Handicapped height     Home Equipment: Grab bars - toilet;Grab bars - tub/shower;Rollator (4 wheels);Rolling Walker (2 wheels);BSC/3in1;Wheelchair - manual          Prior Functioning/Environment Prior Level of Function : Needs assist       Physical Assist : ADLs (physical)     Mobility Comments: amb with rollator house hold distances, mwc community distances ADLs Comments: Pt husband assists with IADLs; bathing, LB dressing;        OT Problem List: Decreased strength;Decreased knowledge of use of DME or AE;Decreased activity tolerance;Decreased safety awareness      OT Treatment/Interventions: Self-care/ADL training;Patient/family education;DME and/or AE instruction    OT Goals(Current goals can be found in the care plan section) Acute Rehab OT Goals Patient Stated Goal: go to rehab OT Goal Formulation: With patient Time For Goal Achievement: 09/03/21 Potential to Achieve Goals: Good ADL Goals Pt Will Perform Grooming: sitting;standing;with modified independence Pt Will Transfer to Toilet: with modified independence;bedside commode Pt Will Perform Toileting - Clothing Manipulation and hygiene: with modified independence;sit to/from stand  OT Frequency: Min 2X/week    Co-evaluation              AM-PAC OT "6 Clicks" Daily Activity     Outcome Measure Help from another person eating meals?: None Help from another person taking care of personal grooming?: A Little Help from another person toileting, which includes using toliet, bedpan, or urinal?: A  Little Help from another person bathing (including washing, rinsing, drying)?: A Lot Help from another person to put on and taking off regular upper body clothing?: A Little Help from another person to put on and taking off regular lower body clothing?: A Lot 6 Click Score: 17   End of Session    Activity Tolerance: Patient tolerated treatment well Patient left: in bed;with call bell/phone within reach;with bed alarm set;with family/visitor present  OT Visit Diagnosis: Unsteadiness on feet (R26.81);Muscle weakness (generalized) (M62.81)                Time: 1140-1157 OT Time Calculation (min): 17 min Charges:  OT General Charges $OT Visit: 1 Visit OT Evaluation $OT Eval Low Complexity: 1 Low  Shanon Payor, OTD OTR/L  08/20/21, 1:45 PM

## 2021-08-21 ENCOUNTER — Inpatient Hospital Stay: Payer: Medicare Other

## 2021-08-21 DIAGNOSIS — J9611 Chronic respiratory failure with hypoxia: Secondary | ICD-10-CM

## 2021-08-21 DIAGNOSIS — J9621 Acute and chronic respiratory failure with hypoxia: Secondary | ICD-10-CM | POA: Diagnosis not present

## 2021-08-21 DIAGNOSIS — J432 Centrilobular emphysema: Secondary | ICD-10-CM | POA: Diagnosis not present

## 2021-08-21 DIAGNOSIS — I5033 Acute on chronic diastolic (congestive) heart failure: Secondary | ICD-10-CM | POA: Diagnosis not present

## 2021-08-21 DIAGNOSIS — I3139 Other pericardial effusion (noninflammatory): Secondary | ICD-10-CM | POA: Diagnosis not present

## 2021-08-21 DIAGNOSIS — I4821 Permanent atrial fibrillation: Secondary | ICD-10-CM | POA: Diagnosis not present

## 2021-08-21 LAB — CBC
HCT: 32.8 % — ABNORMAL LOW (ref 36.0–46.0)
Hemoglobin: 10.1 g/dL — ABNORMAL LOW (ref 12.0–15.0)
MCH: 27.8 pg (ref 26.0–34.0)
MCHC: 30.8 g/dL (ref 30.0–36.0)
MCV: 90.4 fL (ref 80.0–100.0)
Platelets: 337 10*3/uL (ref 150–400)
RBC: 3.63 MIL/uL — ABNORMAL LOW (ref 3.87–5.11)
RDW: 14.7 % (ref 11.5–15.5)
WBC: 17.3 10*3/uL — ABNORMAL HIGH (ref 4.0–10.5)
nRBC: 0 % (ref 0.0–0.2)

## 2021-08-21 LAB — BASIC METABOLIC PANEL
Anion gap: 10 (ref 5–15)
BUN: 34 mg/dL — ABNORMAL HIGH (ref 8–23)
CO2: 36 mmol/L — ABNORMAL HIGH (ref 22–32)
Calcium: 8.4 mg/dL — ABNORMAL LOW (ref 8.9–10.3)
Chloride: 90 mmol/L — ABNORMAL LOW (ref 98–111)
Creatinine, Ser: 0.93 mg/dL (ref 0.44–1.00)
GFR, Estimated: >60 mL/min (ref >60)
Glucose, Bld: 161 mg/dL — ABNORMAL HIGH (ref 70–99)
Potassium: 4 mmol/L (ref 3.5–5.1)
Sodium: 136 mmol/L (ref 135–145)

## 2021-08-21 LAB — GLUCOSE, CAPILLARY
Glucose-Capillary: 184 mg/dL — ABNORMAL HIGH (ref 70–99)
Glucose-Capillary: 242 mg/dL — ABNORMAL HIGH (ref 70–99)
Glucose-Capillary: 160 mg/dL — ABNORMAL HIGH (ref 70–99)
Glucose-Capillary: 114 mg/dL — ABNORMAL HIGH (ref 70–99)

## 2021-08-21 NOTE — Progress Notes (Signed)
Progress Note  Patient Name: Karen [name] Date of Encounter: 08/21/2021  [facility] Cardiologist: Ida Rogue, MD   Subjective   Feels short of breath this a.m., also feels weak and tired.  Inpatient Medications    Scheduled Meds:  allopurinol  100 mg Oral Daily   apixaban  5 mg Oral BID   atorvastatin  10 mg Oral Daily   budesonide (PULMICORT) nebulizer solution  0.25 mg Nebulization BID   chlorhexidine  15 mL Mouth Rinse BID   diltiazem  180 mg Oral Daily   empagliflozin  10 mg Oral Daily   ferrous sulfate  325 mg Oral Q breakfast   fluticasone  2 spray Each Nare Daily   insulin aspart  0-20 Units Subcutaneous TID WC   insulin aspart  10 Units Subcutaneous TID WC   insulin glargine-yfgn  25 Units Subcutaneous BID   ipratropium  2 spray Each Nare QID   lidocaine  1 patch Transdermal Q24H   mouth rinse  15 mL Mouth Rinse q12n4p   melatonin  5 mg Oral QHS   montelukast  10 mg Oral QHS   pantoprazole  40 mg Oral Daily   spironolactone  12.5 mg Oral Daily   torsemide  40 mg Oral BID   umeclidinium-vilanterol  1 puff Inhalation Daily   vitamin B-12  500 mcg Oral Daily   Continuous Infusions:  PRN Meds: acetaminophen, albuterol, ipratropium-albuterol, morphine injection, oxyCODONE, senna-docusate, traZODone   Vital Signs    Vitals:   08/20/21 1952 08/21/21 0411 08/21/21 0735 08/21/21 0822  BP:  (!) 101/51 (!) 122/50   Pulse:  69 95   Resp:  18 (!) 35   Temp:  98.1 F (36.7 C) 97.6 F (36.4 C)   TempSrc:  Axillary Oral   SpO2: 96% 96% 95% 94%  Weight:        Intake/Output Summary (Last 24 hours) at 08/21/2021 1122 Last data filed at 08/21/2021 1010 Gross per 24 hour  Intake 810 ml  Output 2800 ml  Net -1990 ml      08/20/2021    5:53 AM 08/19/2021    4:40 AM 08/18/2021    4:46 AM  Last 3 Weights  Weight (lbs) 205 lb 6.4 oz 204 lb 1.6 oz 199 lb 15.3 oz  Weight (kg) 93.169 kg 92.579 kg 90.7 kg      Telemetry    Atrial fibrillation, heart  rate 105- Personally Reviewed  ECG     - Personally Reviewed  Physical Exam   GEN: Appears weak and tired Neck: No JVD Cardiac: Irregularly irregular Respiratory: Diminished breath sounds bilaterally, no wheezing GI: Soft, nontender, non-distended  MS: No edema; No deformity. Neuro:  Nonfocal  Psych: Normal affect   Labs    High Sensitivity Troponin:   Recent Labs  Lab 08/03/21 1324 08/03/21 1549 08/14/21 1518 08/14/21 1653  TROPONINIHS 11 10 16 12      Chemistry Recent Labs  Lab 08/14/21 1518 08/15/21 0558 08/19/21 0606 08/20/21 0435 08/21/21 0455  NA 119*   < > 133* 135 136  K 4.2   < > 3.5 4.4 4.0  CL 80*   < > 92* 91* 90*  CO2 25   < > 34* 35* 36*  GLUCOSE 458*   < > 135* 195* 161*  BUN 69*   < > 31* 34* 34*  CREATININE 1.81*   < > 0.80 1.02* 0.93  CALCIUM 8.9   < > 8.7* 8.6* 8.4*  PROT 7.1  --   --   --   --  ALBUMIN 3.1*  --   --   --   --   AST 24  --   --   --   --   ALT 20  --   --   --   --   ALKPHOS 91  --   --   --   --   BILITOT 0.9  --   --   --   --   GFRNONAA 28*   < > >60 57* >60  ANIONGAP 14   < > 7 9 10    < > = values in this interval not displayed.    Lipids No results for input(s): "CHOL", "TRIG", "HDL", "LABVLDL", "LDLCALC", "CHOLHDL" in the last 168 hours.  Hematology Recent Labs  Lab 08/19/21 0606 08/20/21 0435 08/21/21 0455  WBC 17.0* 17.0* 17.3*  RBC 3.61* 3.65* 3.63*  HGB 10.1* 10.3* 10.1*  HCT 31.6* 32.6* 32.8*  MCV 87.5 89.3 90.4  MCH 28.0 28.2 27.8  MCHC 32.0 31.6 30.8  RDW 14.8 14.9 14.7  PLT 341 340 337   Thyroid No results for input(s): "TSH", "FREET4" in the last 168 hours.  BNP Recent Labs  Lab 08/14/21 1530  BNP 85.0    DDimer No results for input(s): "DDIMER" in the last 168 hours.   Radiology    DG Chest Port 1 View  Result Date: 08/21/2021 CLINICAL DATA:  Dyspnea. EXAM: PORTABLE CHEST 1 VIEW COMPARISON:  08/16/2021 FINDINGS: Lordotic technique is demonstrated. Lungs are adequately inflated  demonstrate hazy opacification over the mid to lower lungs bilaterally likely bilateral effusions, left worse than right with associated basilar atelectasis. The left effusion demonstrate slight interval worsening. Infection or edema over the mid lungs is also possible. Stable cardiomegaly. Remainder of the exam is unchanged. IMPRESSION: 1. Hazy opacification over the mid to lower lungs bilaterally likely effusions with associated basilar atelectasis. Slight interval worsening over the left effusion. Infection or edema over the mid lungs is also possible. 2. Stable cardiomegaly. Electronically Signed   By: Marin Olp M.D.   On: 08/21/2021 08:42   ECHOCARDIOGRAM LIMITED  Result Date: 08/20/2021    ECHOCARDIOGRAM LIMITED REPORT   Patient Name:   Karen [name] Date of Exam: 08/20/2021 Medical Rec #:  [id number]        Height:       63.0 in Accession #:    [id number]       Weight:       205.4 lb Date of Birth:  Aug 02, 1945       BSA:          1.956 m Patient Age:    76 years         BP:           111/43 mmHg Patient Gender: F                HR:           68 bpm. Exam Location:  [facility] Procedure: Limited Echo and Cardiac Doppler Indications:     Pericardial Effusion I31.3  History:         Patient has prior history of Echocardiogram examinations, most                  recent 08/16/2021.  Sonographer:     Kathlen Brunswick RDCS Referring Phys:  [place] Diagnosing Phys: Kate Sable MD IMPRESSIONS  1. Left ventricular ejection fraction, by estimation, is 55 to 60%. The left ventricle has normal function.  2. Right ventricular systolic function is low normal.  3. A small pericardial effusion is present. Large pleural effusion in the left lateral region.  4. The aortic valve is calcified.  5. The inferior vena cava is dilated in size with <50% respiratory variability, suggesting right atrial pressure of 15 mmHg. FINDINGS  Left Ventricle: Left ventricular ejection fraction, by estimation, is 55  to 60%. The left ventricle has normal function. The left ventricular internal cavity size was normal in size. There is no left ventricular hypertrophy. Right Ventricle: No increase in right ventricular wall thickness. Right ventricular systolic function is low normal. Pericardium: A small pericardial effusion is present. Aortic Valve: The aortic valve is calcified. Venous: The inferior vena cava is dilated in size with less than 50% respiratory variability, suggesting right atrial pressure of 15 mmHg. Additional Comments: There is a large pleural effusion in the left lateral region. LEFT VENTRICLE PLAX 2D LVIDd:         5.15 cm LVIDs:         3.82 cm LV PW:         1.12 cm LV IVS:        0.98 cm  LEFT ATRIUM         Index LA diam:    4.60 cm 2.35 cm/m Kate Sable MD Electronically signed by Kate Sable MD Signature Date/Time: 08/20/2021/12:12:38 PM    Final     Cardiac Studies   Limited TTE 08/20/2021 1. Left ventricular ejection fraction, by estimation, is 55 to 60%. The  left ventricle has normal function.   2. Right ventricular systolic function is low normal.   3. A small pericardial effusion is present. Large pleural effusion in the  left lateral region.   4. The aortic valve is calcified.   5. The inferior vena cava is dilated in size with <50% respiratory  variability, suggesting right atrial pressure of 15 mmHg.   Patient Profile     76 y.o. female with history of HFpEF, COPD, obesity, respiratory failure, oxygen presenting with shortness of breath and edema, diagnosed with pneumonia and respiratory failure being seen for A-fib RVR and HFpEF.  Assessment & Plan    HFpEF -Appears euvolemic -Net -1.1 L over the past 24 hours -cont p.o. torsemide 40 mg twice daily, aldactone   2.  pericardial effusion -Repeat echo yesterday showed significant improvement in pericardial effusion, trace to mild. -Repeat echo due to patient being fatigued today. -Monitor for pericardial  effusion recurrence with patient being on Eliquis.   3.  Persistent A-fib -Heart rate controlled -Cardizem 180 daily -Eliquis 5 mg twice daily   4.  COPD, respiratory failure, pneumonia -Endorses shortness of breath, fatigue -On high flow nasal oxygen -Patient may need higher level respiratory support/CPAP/BiPAP. -Antibiotics, management as per medicine team, recommend ABG  to monitor possible CO2 retention and appropriate treatment    Total encounter time more than 50 minutes  Greater than 50% was spent in counseling and coordination of care with the patient     Signed, Kate Sable, MD  08/21/2021, 11:22 AM

## 2021-08-21 NOTE — Plan of Care (Signed)
  Problem: Education: Goal: Knowledge of General Education information will improve Description: Including pain rating scale, medication(s)/side effects and non-pharmacologic comfort measures Outcome: Progressing   Problem: Health Behavior/Discharge Planning: Goal: Ability to manage health-related needs will improve Outcome: Progressing   Problem: Clinical Measurements: Goal: Ability to maintain clinical measurements within normal limits will improve Outcome: Progressing Goal: Will remain free from infection Outcome: Progressing Goal: Diagnostic test results will improve Outcome: Progressing Goal: Respiratory complications will improve Outcome: Progressing Goal: Cardiovascular complication will be avoided Outcome: Progressing   Problem: Activity: Goal: Risk for activity intolerance will decrease Outcome: Progressing   Problem: Nutrition: Goal: Adequate nutrition will be maintained Outcome: Progressing   Problem: Coping: Goal: Level of anxiety will decrease Outcome: Progressing   Problem: Elimination: Goal: Will not experience complications related to bowel motility Outcome: Progressing Goal: Will not experience complications related to urinary retention Outcome: Progressing   Problem: Pain Managment: Goal: General experience of comfort will improve Outcome: Progressing   Problem: Safety: Goal: Ability to remain free from injury will improve Outcome: Progressing   Problem: Skin Integrity: Goal: Risk for impaired skin integrity will decrease Outcome: Progressing   Problem: Education: Goal: Ability to describe self-care measures that may prevent or decrease complications (Diabetes Survival Skills Education) will improve Outcome: Progressing Goal: Individualized Educational Video(s) Outcome: Progressing   Problem: Coping: Goal: Ability to adjust to condition or change in health will improve Outcome: Progressing   Problem: Fluid Volume: Goal: Ability to  maintain a balanced intake and output will improve Outcome: Progressing   Problem: Health Behavior/Discharge Planning: Goal: Ability to identify and utilize available resources and services will improve Outcome: Progressing Goal: Ability to manage health-related needs will improve Outcome: Progressing   Problem: Metabolic: Goal: Ability to maintain appropriate glucose levels will improve Outcome: Progressing   Problem: Nutritional: Goal: Maintenance of adequate nutrition will improve Outcome: Progressing Goal: Progress toward achieving an optimal weight will improve Outcome: Progressing   Problem: Skin Integrity: Goal: Risk for impaired skin integrity will decrease Outcome: Progressing   Problem: Tissue Perfusion: Goal: Adequacy of tissue perfusion will improve Outcome: Progressing   Problem: Education: Goal: Ability to describe self-care measures that may prevent or decrease complications (Diabetes Survival Skills Education) will improve Outcome: Progressing Goal: Individualized Educational Video(s) Outcome: Progressing   Problem: Coping: Goal: Ability to adjust to condition or change in health will improve Outcome: Progressing   Problem: Fluid Volume: Goal: Ability to maintain a balanced intake and output will improve Outcome: Progressing   Problem: Health Behavior/Discharge Planning: Goal: Ability to identify and utilize available resources and services will improve Outcome: Progressing Goal: Ability to manage health-related needs will improve Outcome: Progressing   Problem: Metabolic: Goal: Ability to maintain appropriate glucose levels will improve Outcome: Progressing   Problem: Nutritional: Goal: Maintenance of adequate nutrition will improve Outcome: Progressing Goal: Progress toward achieving an optimal weight will improve Outcome: Progressing   Problem: Skin Integrity: Goal: Risk for impaired skin integrity will decrease Outcome: Progressing    Problem: Tissue Perfusion: Goal: Adequacy of tissue perfusion will improve Outcome: Progressing   

## 2021-08-21 NOTE — Progress Notes (Signed)
PROGRESS NOTE    Karen [name]  [facility]:[id number] DOB: 08-21-1945 DOA: 08/14/2021 PCP: Margarita Rana, MD    Assessment & Plan:   Principal Problem:   HAP (hospital-acquired pneumonia) Active Problems:   Acute on chronic heart failure with preserved ejection fraction (HFpEF) (HCC)   Hyponatremia   Sepsis due to pneumonia ([place])   Chronic respiratory failure with hypoxia (HCC)   Hypokalemia   Uncontrolled type 2 diabetes mellitus with hyperglycemia, with long-term current use of insulin (HCC)   AF (paroxysmal atrial fibrillation) (HCC)   Acute kidney injury superimposed on CKD (HCC)   OSA (obstructive sleep apnea)   COPD (chronic obstructive pulmonary disease) (HCC)   Chest pain   Gastroesophageal reflux disease without esophagitis   Pericardial effusion   Permanent atrial fibrillation ([place])  Assessment and Plan: Sepsis: secondary to hospital-acquired pneumonia. Was recently hospitalized here and was discharged to skilled nursing facility on 08/10/2021. Completed abx course. Resolved   Acute on chronic hypoxic respiratory failure: repeat CXR slight worsening of L sided pleural effusion. Uses 3L [place] at home but still on HFNC. Morphine prn. Has hx of CHF, COPD, OSA, pulmonary hypertension. BiPAP prn.     Acute on chronic diastolic CHF: echo done on 08/04/2021 showed normal EF, grade III diastolic dysfunction, TR. Continue on aldactone, torsemide as per cardio. Monitor I/Os   COPD exacerbation: continue on bronchodilators & supplemental oxygen. Encourage incentive spirometry   PAF: continue on cardizem, eliquis    B/l pleural effusions: L>R. S/p L thoracentesis w/ 846mL of clear fluid removed on 08/16/21. Continue on torsemide, aldactone. Repeat CXR shows slight worsening of left sided pleural effusion. Repeat L thoracentesis w/ fluid studies ordered.   Pericardial effusion: w/o cardiac tamponade physiology as per cardio. Repeat limited echo showed small pericardial effusion present  still. Management as per cardio    Chronic hyponatremia: likely contributed to by hypervolemic hyponatremia from CHF and also hyperglycemia. WNL again today   DM2: poorly controlled, HbA1c 9.1. Continue on glargine, aspart & SSI w/ accuchecks    Hypokalemia: within normal limits today    OSA: CPAP qhs     GERD: continue on PPI    Chest pain:  EKG showed sinus tachycardia.  Chest wall was tender on palpitation.  Most likely atypical chest pain.  Troponins are negative. Resolved    AKI on CKD stage IIIa: Cr is labile. Avoid nephrotoxic meds    Morbid obesity: BMI 36.3. Complicates overall care & prognosis        DVT prophylaxis: SCDs Code Status:  DNR Family Communication: discussed pt's care w/ pt's husband, Ray, and answered his questions  Disposition Plan: PT recs SNF   Level of care: Progressive  Status is: Inpatient Remains inpatient appropriate because: severity of illness, still on HFNC     Consultants:  Cardio  Palliative care   Procedures:   Antimicrobials:   Subjective: Pt c/o shortness of breath   Objective: Vitals:   08/20/21 1924 08/20/21 1952 08/21/21 0411 08/21/21 0735  BP: (!) 129/55  (!) 101/51 (!) 122/50  Pulse: 63  69 95  Resp: 18  18 (!) 35  Temp: 97.8 F (36.6 C)  98.1 F (36.7 C) 97.6 F (36.4 C)  TempSrc: Oral  Axillary Oral  SpO2: 96% 96% 96% 95%  Weight:        Intake/Output Summary (Last 24 hours) at 08/21/2021 0751 Last data filed at 08/21/2021 0422 Gross per 24 hour  Intake 810 ml  Output 2800 ml  Net -1990 ml   Filed Weights   08/18/21 0446 08/19/21 0440 08/20/21 0553  Weight: 90.7 kg 92.6 kg 93.2 kg    Examination:  General exam: appears comfortable    Respiratory system: diminished breath sounds b/l  Cardiovascular system: irregularly irregular. No rubs or gallops Gastrointestinal system: Abd is soft, NT, obese & hypoactive bowel sounds  Central nervous system: Alert and oriented. Moves all extremities   Psychiatry: judgement and insight appears normal. Flat mood and affect     Data Reviewed: I have personally reviewed following labs and imaging studies  CBC: Recent Labs  Lab 08/14/21 1518 08/15/21 0558 08/17/21 8921 08/18/21 0553 08/19/21 0606 08/20/21 0435 08/21/21 0455  WBC 25.1*   < > 14.0* 17.2* 17.0* 17.0* 17.3*  NEUTROABS 22.1*  --   --   --   --   --   --   HGB 11.0*   < > 10.4* 10.6* 10.1* 10.3* 10.1*  HCT 33.6*   < > 32.7* 33.0* 31.6* 32.6* 32.8*  MCV 86.4   < > 88.1 88.9 87.5 89.3 90.4  PLT 346   < > 327 367 341 340 337   < > = values in this interval not displayed.   Basic Metabolic Panel: Recent Labs  Lab 08/17/21 0632 08/18/21 0553 08/19/21 0606 08/20/21 0435 08/21/21 0455  NA 133* 135 133* 135 136  K 3.7 3.4* 3.5 4.4 4.0  CL 93* 93* 92* 91* 90*  CO2 32 33* 34* 35* 36*  GLUCOSE 179* 142* 135* 195* 161*  BUN 46* 39* 31* 34* 34*  CREATININE 1.01* 0.80 0.80 1.02* 0.93  CALCIUM 8.8* 8.6* 8.7* 8.6* 8.4*   GFR: Estimated Creatinine Clearance: 54.9 mL/min (by C-G formula based on SCr of 0.93 mg/dL). Liver Function Tests: Recent Labs  Lab 08/14/21 1518  AST 24  ALT 20  ALKPHOS 91  BILITOT 0.9  PROT 7.1  ALBUMIN 3.1*   No results for input(s): "LIPASE", "AMYLASE" in the last 168 hours. No results for input(s): "AMMONIA" in the last 168 hours. Coagulation Profile: No results for input(s): "INR", "PROTIME" in the last 168 hours. Cardiac Enzymes: No results for input(s): "CKTOTAL", "CKMB", "CKMBINDEX", "TROPONINI" in the last 168 hours. BNP (last 3 results) No results for input(s): "PROBNP" in the last 8760 hours. HbA1C: No results for input(s): "HGBA1C" in the last 72 hours. CBG: Recent Labs  Lab 08/20/21 0813 08/20/21 1147 08/20/21 1648 08/20/21 2002 08/20/21 2357  GLUCAP 190* 290* 184* 194* 146*   Lipid Profile: No results for input(s): "CHOL", "HDL", "LDLCALC", "TRIG", "CHOLHDL", "LDLDIRECT" in the last 72 hours. Thyroid Function  Tests: No results for input(s): "TSH", "T4TOTAL", "FREET4", "T3FREE", "THYROIDAB" in the last 72 hours. Anemia Panel: No results for input(s): "VITAMINB12", "FOLATE", "FERRITIN", "TIBC", "IRON", "RETICCTPCT" in the last 72 hours. Sepsis Labs: Recent Labs  Lab 08/14/21 1653 08/14/21 1910  PROCALCITON 0.31  --   LATICACIDVEN 2.5* 2.2*    Recent Results (from the past 240 hour(s))  Blood culture (routine x 2)     Status: None   Collection Time: 08/14/21  4:53 PM   Specimen: BLOOD  Result Value Ref Range Status   Specimen Description BLOOD [facility]  Final   Special Requests BOTTLES DRAWN AEROBIC AND ANAEROBIC BCLV  Final   Culture   Final    NO GROWTH 5 DAYS Performed at [facility], [address] [place], [place] [address]    Report Status 08/19/2021 FINAL  Final  Blood culture (routine x  2)     Status: None   Collection Time: 08/14/21  4:55 PM   Specimen: BLOOD  Result Value Ref Range Status   Specimen Description BLOOD RIGHT ANTECUBITAL  Final   Special Requests   Final    BOTTLES DRAWN AEROBIC AND ANAEROBIC Blood Culture adequate volume   Culture   Final    NO GROWTH 5 DAYS Performed at [facility], [address], [place], [place] [address]    Report Status 08/19/2021 FINAL  Final  MRSA Next Gen by PCR, Nasal     Status: None   Collection Time: 08/15/21  5:50 PM   Specimen: Nasal Mucosa; Nasal Swab  Result Value Ref Range Status   MRSA by PCR Next Gen NOT DETECTED NOT DETECTED Final    Comment: (NOTE) The GeneXpert MRSA Assay (FDA approved for NASAL specimens only), is one component of a comprehensive MRSA colonization surveillance program. It is not intended to diagnose MRSA infection nor to guide or monitor treatment for MRSA infections. Test performance is not FDA approved in patients less than 40 years old. Performed at [facility], [address] [place], [place] [address]   Body fluid culture w Gram Stain     Status: None    Collection Time: 08/16/21  4:20 PM   Specimen: PATH Cytology Pleural fluid  Result Value Ref Range Status   Specimen Description   Final    PLEURAL Performed at [facility], [address] [place], [place] [address]    Special Requests   Final    PLEURAL Performed at [facility], [address] [place], [place] [address]    Gram Stain   Final    CYTOSPIN SMEAR WBC PRESENT,BOTH PMN AND MONONUCLEAR NO ORGANISMS SEEN    Culture   Final    NO GROWTH 3 DAYS Performed at [facility], [place] [address], [place], [place] [address]    Report Status 08/20/2021 FINAL  Final         Radiology Studies: ECHOCARDIOGRAM LIMITED  Result Date: 08/20/2021    ECHOCARDIOGRAM LIMITED REPORT   Patient Name:   Karen [name] Date of Exam: 08/20/2021 Medical Rec #:  [id number]        Height:       63.0 in Accession #:    [id number]       Weight:       205.4 lb Date of Birth:  Jul 13, 1945       BSA:          1.956 m Patient Age:    76 years         BP:           111/43 mmHg Patient Gender: F                HR:           68 bpm. Exam Location:  [facility] Procedure: Limited Echo and Cardiac Doppler Indications:     Pericardial Effusion I31.3  History:         Patient has prior history of Echocardiogram examinations, most                  recent 08/16/2021.  Sonographer:     Kathlen Brunswick RDCS Referring Phys:  [place] Diagnosing Phys: Kate Sable MD IMPRESSIONS  1. Left ventricular ejection fraction, by estimation, is 55 to 60%. The left ventricle has normal function.  2. Right ventricular systolic function  is low normal.  3. A small pericardial effusion is present. Large pleural effusion in the left lateral region.  4. The aortic valve is calcified.  5. The inferior vena cava is dilated in size with <50% respiratory variability, suggesting right atrial pressure of 15 mmHg. FINDINGS  Left Ventricle: Left ventricular ejection fraction, by estimation, is 55 to 60%.  The left ventricle has normal function. The left ventricular internal cavity size was normal in size. There is no left ventricular hypertrophy. Right Ventricle: No increase in right ventricular wall thickness. Right ventricular systolic function is low normal. Pericardium: A small pericardial effusion is present. Aortic Valve: The aortic valve is calcified. Venous: The inferior vena cava is dilated in size with less than 50% respiratory variability, suggesting right atrial pressure of 15 mmHg. Additional Comments: There is a large pleural effusion in the left lateral region. LEFT VENTRICLE PLAX 2D LVIDd:         5.15 cm LVIDs:         3.82 cm LV PW:         1.12 cm LV IVS:        0.98 cm  LEFT ATRIUM         Index LA diam:    4.60 cm 2.35 cm/m Kate Sable MD Electronically signed by Kate Sable MD Signature Date/Time: 08/20/2021/12:12:38 PM    Final         Scheduled Meds:  allopurinol  100 mg Oral Daily   apixaban  5 mg Oral BID   atorvastatin  10 mg Oral Daily   budesonide (PULMICORT) nebulizer solution  0.25 mg Nebulization BID   chlorhexidine  15 mL Mouth Rinse BID   diltiazem  180 mg Oral Daily   empagliflozin  10 mg Oral Daily   ferrous sulfate  325 mg Oral Q breakfast   fluticasone  2 spray Each Nare Daily   insulin aspart  0-20 Units Subcutaneous TID WC   insulin aspart  10 Units Subcutaneous TID WC   insulin glargine-yfgn  25 Units Subcutaneous BID   ipratropium  2 spray Each Nare QID   lidocaine  1 patch Transdermal Q24H   mouth rinse  15 mL Mouth Rinse q12n4p   melatonin  5 mg Oral QHS   montelukast  10 mg Oral QHS   pantoprazole  40 mg Oral Daily   spironolactone  12.5 mg Oral Daily   torsemide  40 mg Oral BID   umeclidinium-vilanterol  1 puff Inhalation Daily   vitamin B-12  500 mcg Oral Daily   Continuous Infusions:     LOS: 7 days    Time spent: 27 mins     Wyvonnia Dusky, MD [facility] Pager [phone]  If 7PM-7AM, please contact  night-coverage 08/21/2021, 7:51 AM

## 2021-08-22 ENCOUNTER — Encounter: Payer: Self-pay | Admitting: Internal Medicine

## 2021-08-22 ENCOUNTER — Inpatient Hospital Stay: Payer: Medicare Other

## 2021-08-22 ENCOUNTER — Inpatient Hospital Stay (HOSPITAL_COMMUNITY)
Admit: 2021-08-22 | Discharge: 2021-08-22 | Disposition: A | Discharge status: Home / Self Care | Payer: Medicare Other | Attending: Cardiology | Admitting: Cardiology

## 2021-08-22 DIAGNOSIS — J9621 Acute and chronic respiratory failure with hypoxia: Secondary | ICD-10-CM | POA: Diagnosis not present

## 2021-08-22 DIAGNOSIS — Y95 Nosocomial condition: Secondary | ICD-10-CM | POA: Diagnosis not present

## 2021-08-22 DIAGNOSIS — J90 Pleural effusion, not elsewhere classified: Secondary | ICD-10-CM

## 2021-08-22 DIAGNOSIS — I5033 Acute on chronic diastolic (congestive) heart failure: Secondary | ICD-10-CM | POA: Diagnosis not present

## 2021-08-22 DIAGNOSIS — I3139 Other pericardial effusion (noninflammatory): Secondary | ICD-10-CM

## 2021-08-22 DIAGNOSIS — J189 Pneumonia, unspecified organism: Secondary | ICD-10-CM | POA: Diagnosis not present

## 2021-08-22 DIAGNOSIS — Z7189 Other specified counseling: Secondary | ICD-10-CM | POA: Diagnosis not present

## 2021-08-22 LAB — CBC
HCT: 34.2 % — ABNORMAL LOW (ref 36.0–46.0)
Hemoglobin: 10.5 g/dL — ABNORMAL LOW (ref 12.0–15.0)
MCH: 27.4 pg (ref 26.0–34.0)
MCHC: 30.7 g/dL (ref 30.0–36.0)
MCV: 89.3 fL (ref 80.0–100.0)
Platelets: 383 10*3/uL (ref 150–400)
RBC: 3.83 MIL/uL — ABNORMAL LOW (ref 3.87–5.11)
RDW: 14.7 % (ref 11.5–15.5)
WBC: 16 10*3/uL — ABNORMAL HIGH (ref 4.0–10.5)
nRBC: 0 % (ref 0.0–0.2)

## 2021-08-22 LAB — BASIC METABOLIC PANEL
Anion gap: 9 (ref 5–15)
BUN: 34 mg/dL — ABNORMAL HIGH (ref 8–23)
CO2: 38 mmol/L — ABNORMAL HIGH (ref 22–32)
Calcium: 9 mg/dL (ref 8.9–10.3)
Chloride: 89 mmol/L — ABNORMAL LOW (ref 98–111)
Creatinine, Ser: 1 mg/dL (ref 0.44–1.00)
GFR, Estimated: 58 mL/min — ABNORMAL LOW (ref >60)
Glucose, Bld: 99 mg/dL (ref 70–99)
Potassium: 3.8 mmol/L (ref 3.5–5.1)
Sodium: 136 mmol/L (ref 135–145)

## 2021-08-22 LAB — ECHOCARDIOGRAM LIMITED
Height: 63 in
S' Lateral: 2.1 cm
Weight: 3301.61 oz

## 2021-08-22 LAB — BODY FLUID CELL COUNT WITH DIFFERENTIAL
Eos, Fluid: 2 %
Lymphs, Fluid: 46 %
Monocyte-Macrophage-Serous Fluid: 11 % — ABNORMAL LOW (ref 50–90)
Neutrophil Count, Fluid: 41 % — ABNORMAL HIGH (ref 0–25)
Total Nucleated Cell Count, Fluid: 693 cu mm (ref 0–1000)

## 2021-08-22 LAB — GLUCOSE, CAPILLARY
Glucose-Capillary: 106 mg/dL — ABNORMAL HIGH (ref 70–99)
Glucose-Capillary: 112 mg/dL — ABNORMAL HIGH (ref 70–99)
Glucose-Capillary: 359 mg/dL — ABNORMAL HIGH (ref 70–99)
Glucose-Capillary: 155 mg/dL — ABNORMAL HIGH (ref 70–99)

## 2021-08-22 MED ORDER — CLONAZEPAM 0.5 MG PO TABS
0.5000 mg | ORAL_TABLET | Freq: Once | ORAL | Status: AC
Start: 2021-08-22 — End: 2021-08-22
  Administered 2021-08-22: 0.5 mg via ORAL
  Filled 2021-08-22: qty 1

## 2021-08-22 MED ORDER — FUROSEMIDE 10 MG/ML IJ SOLN
40.0000 mg | Freq: Once | INTRAMUSCULAR | Status: AC
  Administered 2021-08-22: 40 mg via INTRAVENOUS
  Filled 2021-08-22: qty 4

## 2021-08-22 NOTE — Progress Notes (Signed)
Progress Note  Patient Name: Karen [name] Date of Encounter: 08/22/2021  [facility] Cardiologist: Ida Rogue, MD   Subjective   Hypoxic overnight with saturations low on BiPAP, 93% upon provider evaluation. Given IV Lasix 40 mg and prn Duoneb. CXR notable for stable bibasilr collapse/consolidation with bilateral pleural effusions with the left being greater than the right. Renal function and HGB stable. Repeat limited echo, to evaluate pericardial effusion, pending. Documented UOP 1.4 L for the past 24 hours, net - 8.4 L for the admission.   On HFNC with saturations in the mid 90s%. Dyspnea and fatigue unchanged. No chest pain or palpitations.   Inpatient Medications    Scheduled Meds:  allopurinol  100 mg Oral Daily   apixaban  5 mg Oral BID   atorvastatin  10 mg Oral Daily   budesonide (PULMICORT) nebulizer solution  0.25 mg Nebulization BID   chlorhexidine  15 mL Mouth Rinse BID   diltiazem  180 mg Oral Daily   empagliflozin  10 mg Oral Daily   ferrous sulfate  325 mg Oral Q breakfast   fluticasone  2 spray Each Nare Daily   insulin aspart  0-20 Units Subcutaneous TID WC   insulin aspart  10 Units Subcutaneous TID WC   insulin glargine-yfgn  25 Units Subcutaneous BID   ipratropium  2 spray Each Nare QID   lidocaine  1 patch Transdermal Q24H   mouth rinse  15 mL Mouth Rinse q12n4p   melatonin  5 mg Oral QHS   montelukast  10 mg Oral QHS   pantoprazole  40 mg Oral Daily   spironolactone  12.5 mg Oral Daily   torsemide  40 mg Oral BID   umeclidinium-vilanterol  1 puff Inhalation Daily   vitamin B-12  500 mcg Oral Daily   Continuous Infusions:  PRN Meds: acetaminophen, albuterol, ipratropium-albuterol, morphine injection, oxyCODONE, senna-docusate, traZODone   Vital Signs    Vitals:   08/22/21 0400 08/22/21 0408 08/22/21 0542 08/22/21 0800  BP:    106/60  Pulse: 88  93 85  Resp: 20  (!) 27 (!) 27  Temp:    97.7 F (36.5 C)  TempSrc:    Axillary   SpO2: 97% 97% 96% 97%  Weight:   93.6 kg   Height:   5\' 3"  (1.6 m)     Intake/Output Summary (Last 24 hours) at 08/22/2021 0805 Last data filed at 08/22/2021 0800 Gross per 24 hour  Intake 480 ml  Output 2150 ml  Net -1670 ml       08/22/2021    5:42 AM 08/20/2021    5:53 AM 08/19/2021    4:40 AM  Last 3 Weights  Weight (lbs) 206 lb 5.6 oz 205 lb 6.4 oz 204 lb 1.6 oz  Weight (kg) 93.6 kg 93.169 kg 92.579 kg      Telemetry    Atrial fib/flutter with ventricular rates in the 90s bpm  - Personally Reviewed  ECG    No new tracings - Personally Reviewed  Physical Exam   GEN: Appears weak and tired Neck: JVD difficult to assess secondary to body habitus and respiratory support apparatus  Cardiac: Irregularly irregular Respiratory: Diminished breath sounds bilaterally, no wheezing GI: Soft, nontender, non-distended  MS: No edema; No deformity Neuro:  Nonfocal  Psych: Normal affect   Labs    High Sensitivity Troponin:   Recent Labs  Lab 08/03/21 1324 08/03/21 1549 08/14/21 1518 08/14/21 1653  TROPONINIHS 11 10 16  12  Chemistry Recent Labs  Lab 08/20/21 0435 08/21/21 0455 08/22/21 0548  NA 135 136 136  K 4.4 4.0 3.8  CL 91* 90* 89*  CO2 35* 36* 38*  GLUCOSE 195* 161* 99  BUN 34* 34* 34*  CREATININE 1.02* 0.93 1.00  CALCIUM 8.6* 8.4* 9.0  GFRNONAA 57* >60 58*  ANIONGAP 9 10 9      Lipids No results for input(s): "CHOL", "TRIG", "HDL", "LABVLDL", "LDLCALC", "CHOLHDL" in the last 168 hours.  Hematology Recent Labs  Lab 08/20/21 0435 08/21/21 0455 08/22/21 0548  WBC 17.0* 17.3* 16.0*  RBC 3.65* 3.63* 3.83*  HGB 10.3* 10.1* 10.5*  HCT 32.6* 32.8* 34.2*  MCV 89.3 90.4 89.3  MCH 28.2 27.8 27.4  MCHC 31.6 30.8 30.7  RDW 14.9 14.7 14.7  PLT 340 337 383    Thyroid No results for input(s): "TSH", "FREET4" in the last 168 hours.  BNP No results for input(s): "BNP", "PROBNP" in the last 168 hours.   DDimer No results for input(s): "DDIMER" in  the last 168 hours.   Radiology    DG Chest Port 1 View  Result Date: 08/22/2021 IMPRESSION: Stable exam. Bibasilar collapse/consolidation with bilateral pleural effusions, left greater than right. Electronically Signed   By: Misty Stanley M.D.   On: 08/22/2021 05:31   DG Chest Port 1 View  Result Date: 08/21/2021 CLINICAL DATA:  Dyspnea. EXAM: PORTABLE CHEST 1 VIEW IMPRESSION: 1. Hazy opacification over the mid to lower lungs bilaterally likely effusions with associated basilar atelectasis. Slight interval worsening over the left effusion. Infection or edema over the mid lungs is also possible. 2. Stable cardiomegaly. Electronically Signed   By: Marin Olp M.D.   On: 08/21/2021 08:42   Cardiac Studies   Limited echo 08/22/2021: Pending __________  Limited echo 08/20/2021: 1. Left ventricular ejection fraction, by estimation, is 55 to 60%. The  left ventricle has normal function.   2. Right ventricular systolic function is low normal.   3. A small pericardial effusion is present. Large pleural effusion in the  left lateral region.   4. The aortic valve is calcified.   5. The inferior vena cava is dilated in size with <50% respiratory  variability, suggesting right atrial pressure of 15 mmHg.  __________  Limited echo 08/16/2021: 1. Left ventricular ejection fraction, by estimation, is 55 to 60%. The  left ventricle has normal function. The left ventricle has no regional  wall motion abnormalities. There is moderate left ventricular hypertrophy.   2. Right ventricular systolic function is moderately reduced. The right  ventricular size is normal. Moderately increased right ventricular wall  thickness. There is severely elevated pulmonary artery systolic pressure.   3. Moderate pericardial effusion. The pericardial effusion is  circumferential, though the largest fluid collections are inferolateral to  the LV and adjacent to the RA. There is no definite evidence of tamponade   physiology, though evaluation is limited  without mitral/tricuspid valve Doppler waveforms to assess for respiratory  variation.   4. Mild mitral valve regurgitation.   5. Tricuspid valve regurgitation is moderate.   6. The aortic valve has an indeterminant number of cusps. There is  moderate calcification of the aortic valve. There is moderate thickening  of the aortic valve.   7. The inferior vena cava is dilated in size with <50% respiratory  variability, suggesting right atrial pressure of 15 mmHg. __________  2D echo 08/04/2021: 1. Left ventricular ejection fraction, by estimation, is 55 to 60%. The  left ventricle has  normal function. The left ventricle has no regional  wall motion abnormalities. Left ventricular diastolic parameters are  consistent with Grade III diastolic  dysfunction (restrictive). Elevated left atrial pressure. There is the  interventricular septum is flattened in systole, consistent with right  ventricular pressure overload.   2. Right ventricular systolic function is mildly reduced. The right  ventricular size is moderately enlarged. There is moderately elevated  pulmonary artery systolic pressure.   3. Left atrial size was mildly dilated.   4. The mitral valve is degenerative. Mild mitral valve regurgitation. No  evidence of mitral stenosis.   5. Tricuspid valve regurgitation is moderate.   6. The aortic valve has an indeterminant number of cusps. There is mild  calcification of the aortic valve. There is moderate thickening of the  aortic valve. Aortic valve regurgitation is not visualized. Mild to  moderate aortic valve stenosis. Aortic  valve area, by VTI measures 1.20 cm. Aortic valve mean gradient measures  10.3 mmHg.   7. The inferior vena cava is dilated in size with <50% respiratory  variability, suggesting right atrial pressure of 15 mmHg.  Patient Profile     76 y.o. female with history of HFpEF, COPD, obesity, respiratory failure, oxygen  presenting with shortness of breath and edema, diagnosed with pneumonia and respiratory failure being seen for A-fib RVR, HFpEF, and pericardial effusion.  Assessment & Plan    HFpEF with bilateral pleural effusions: -Status post thoracentesis on 6/13 with 850 mL clear fluid removed (cytology pending) with repeat CXR again demonstrating L>R pleural effusion  -Repeat thoracentesis pending -Net -1.4 L over the past 24 hours -Continue torsemide 40 mg twice daily, Jardiance, and spironolactone    2.  Pericardial effusion: -Repeat echo 6/17 showed significant improvement in pericardial effusion, trace to mild. -Repeat echo pending due -Monitor for pericardial effusion recurrence with patient being on Eliquis   3.  Persistent A-fib: -Heart rate controlled -Cardizem 180 daily -Eliquis 5 mg twice daily   4.  Acute on chronic hypoxic respiratory failure: -Multifactorial including HFpEF< pleural effusions, pericardial effusion, hospital-acquired PNA, and COPD -On high flow nasal oxygen -Appreciate IM assistance      Signed, Christell Faith, PA-C  08/22/2021, 8:05 AM

## 2021-08-22 NOTE — Progress Notes (Signed)
Name: Karen [name] MRN: [id number] DOB: [birth year]     Subjective: Secure chat received from nursing "Hey pt is only satting 84-85% on 80-100%% bipap. She needs to go to stepdown bc we can only take up to 60% bipap"   Objective: Karen [name] is a 76 year old female with a past medical history of heart failure PEF, anemia, CKD 4, COPD, diabetes, hypertension, hyperlipidemia.  She presented to [facility] ED for left chest pain and shortness of breath after being diagnosed with pneumonia.  She is currently being treated for bilateral pleural effusions, COPD exacerbation, and acute on chronic respiratory failure. On arrival to bedside patient is on 80% FiO2 via BiPAP with 93% oxygen saturation. At that time no PRN albuterol or duonebs had been given.  Today's Vitals   08/22/21 0210 08/22/21 0352 08/22/21 0400 08/22/21 0408  BP:  131/60    Pulse:  84 88   Resp:  (!) 31 20   Temp:      TempSrc:      SpO2:  94% 97% 97%  Weight:      PainSc: Asleep      Body mass index is 36.38 kg/m.   DG Chest Port 1 View  Result Date: 08/22/2021 CLINICAL DATA:  Hypoxia. EXAM: PORTABLE CHEST 1 VIEW COMPARISON:  08/21/2021 FINDINGS: 0410 hours. The cardio pericardial silhouette is enlarged. Bibasilar collapse/consolidation is similar to prior with bilateral pleural effusions, left greater than right also not substantially changed. Mild vascular congestion noted bilaterally with some improvement in aeration of the upper lungs. The visualized bony structures of the thorax are unremarkable. Telemetry leads overlie the chest. IMPRESSION: Stable exam. Bibasilar collapse/consolidation with bilateral pleural effusions, left greater than right. Electronically Signed   By: Misty Stanley M.D.   On: 08/22/2021 05:31   DG Chest Port 1 View  Result Date: 08/21/2021 CLINICAL DATA:  Dyspnea. EXAM: PORTABLE CHEST 1 VIEW COMPARISON:  08/16/2021 FINDINGS: Lordotic technique is demonstrated. Lungs are adequately inflated  demonstrate hazy opacification over the mid to lower lungs bilaterally likely bilateral effusions, left worse than right with associated basilar atelectasis. The left effusion demonstrate slight interval worsening. Infection or edema over the mid lungs is also possible. Stable cardiomegaly. Remainder of the exam is unchanged. IMPRESSION: 1. Hazy opacification over the mid to lower lungs bilaterally likely effusions with associated basilar atelectasis. Slight interval worsening over the left effusion. Infection or edema over the mid lungs is also possible. 2. Stable cardiomegaly. Electronically Signed   By: Marin Olp M.D.   On: 08/21/2021 08:42        Latest Ref Rng & Units 08/21/2021    4:55 AM 08/20/2021    4:35 AM 08/19/2021    6:06 AM  CBC  WBC 4.0 - 10.5 K/uL 17.3  17.0  17.0   Hemoglobin 12.0 - 15.0 g/dL 10.1  10.3  10.1   Hematocrit 36.0 - 46.0 % 32.8  32.6  31.6   Platelets 150 - 400 K/uL 337  340  341       Latest Ref Rng & Units 08/21/2021    4:55 AM 08/20/2021    4:35 AM 08/19/2021    6:06 AM  CMP  Glucose 70 - 99 mg/dL 161  195  135   BUN 8 - 23 mg/dL 34  34  31   Creatinine 0.44 - 1.00 mg/dL 0.93  1.02  0.80   Sodium 135 - 145 mmol/L 136  135  133   Potassium 3.5 -  5.1 mmol/L 4.0  4.4  3.5   Chloride 98 - 111 mmol/L 90  91  92   CO2 22 - 32 mmol/L 36  35  34   Calcium 8.9 - 10.3 mg/dL 8.4  8.6  8.7    Lactic Acid, Venous    Component Value Date/Time   LATICACIDVEN 2.2 (HH) 08/14/2021 1910  ABG    Component Value Date/Time   PHART 7.42 08/16/2021 1003   PCO2ART 50 (H) 08/16/2021 1003   PO2ART 96 08/16/2021 1003   HCO3 32.4 (H) 08/16/2021 1003   O2SAT 98.9 08/16/2021 1003      Cardiac Panel (last 3 results) No results for input(s): "CKTOTAL", "CKMB", "TROPONINIHS", "RELINDX" in the last 72 hours.   Physical Exam:  General: Adult female, acutely ill, lying in bed, BiPAP on HEENT:  atraumatic, neck supple Neuro: A&O x 4, follows commands, PERRL  CV: s1s2, RRR,   no r/m/g Pulm: Regular respirations, diminished breath sounds throughout, patient reports she is short of breath but no increased work of breathing on assessment GI: soft, non-tender, non-distended, bs active x 4 GU: external female catheter in place with clear yellow urine Skin:  no rashes/lesions noted Extremities: warm/dry, pulses + 2 Radial /Pedal, no edema noted   Assessment/Plan:   Acute on chronic hypoxic respiratory failure        - BiPAP, wean as able (settings per RT currently on 12/6, 80% FiO2)        - CXR        - 40 mg IV Lasix  COPD Exacerbation       - Give PRN Duoneb now       - Continue PRN albuterol       - Incentive spirometry as able   0550: BiPAP settings 15/8 60% FiO2 with oxygen saturation of 93%, lasix pending administration by nursing.  Neomia Glass DNP, MHA, FNP-BC Nurse Practitioner [facility] Pager [phone]

## 2021-08-22 NOTE — Procedures (Signed)
Vascular and Interventional Radiology Procedure Note  Patient: Karen [name] DOB: 09-Aug-1945 Medical Record Number: [id number] Note Date/Time: 08/22/21 11:49 AM   Performing Physician: Michaelle Birks, MD Assistant(s): None  Diagnosis: Pleural effusion, recurrent  Procedure: THORACENTESIS, LEFT Therapeutic  Anesthesia: Local Anesthetic Complications: None Estimated Blood Loss: Minimal Specimens:  Sent None  Findings:  The Left chest was accessed with a 48F Pigtail catheter, and 850 mL of fluid was obtained.  See detailed procedure note with images in PACS. The patient tolerated the procedure well without incident or complication and was returned to ICU in stable condition.    Michaelle Birks, MD [facility] Radiology   Pager. [place]

## 2021-08-22 NOTE — Progress Notes (Addendum)
Daily Progress Note   Patient Name: Karen [name]       Date: 08/22/2021 DOB: 07-12-1945  Age: 76 y.o. MRN#: [id number] Attending Physician: Wyvonnia Dusky, MD Primary Care Physician: Margarita Rana, MD Admit Date: 08/14/2021  Reason for Consultation/Follow-up: Establishing goals of care   Subjective: Notes and labs reviewed by myself. Spoke with attending. Plans for ordering IR thoracentesis today. In to see patient. She is on high flow cannula that has increased over the weekend.   She states she does not feel well. She tells me she would not want to live as she is now on heated high flow and being inactive due to SOB long term, but is unsure of a time line for how much longer she would want to continue this level of care. She states she is fine with doing thoracentesis procedures, but is not amenable to having a drain (pleurex) placed. Discussed acceptable QOL and educated on multiple scenarios including best and worst case scenario.  She is aware family can be involved in conversations as she wishes. Attending in to conversation. Discussed updates. Will continue current care.   Nurse updated.   Length of Stay: 8  Current Medications: Scheduled Meds:   allopurinol  100 mg Oral Daily   apixaban  5 mg Oral BID   atorvastatin  10 mg Oral Daily   budesonide (PULMICORT) nebulizer solution  0.25 mg Nebulization BID   chlorhexidine  15 mL Mouth Rinse BID   diltiazem  180 mg Oral Daily   empagliflozin  10 mg Oral Daily   ferrous sulfate  325 mg Oral Q breakfast   fluticasone  2 spray Each Nare Daily   insulin aspart  0-20 Units Subcutaneous TID WC   insulin aspart  10 Units Subcutaneous TID WC   insulin glargine-yfgn  25 Units Subcutaneous BID   ipratropium  2 spray Each Nare QID    lidocaine  1 patch Transdermal Q24H   mouth rinse  15 mL Mouth Rinse q12n4p   melatonin  5 mg Oral QHS   montelukast  10 mg Oral QHS   pantoprazole  40 mg Oral Daily   spironolactone  12.5 mg Oral Daily   torsemide  40 mg Oral BID   umeclidinium-vilanterol  1 puff Inhalation Daily   vitamin B-12  500  mcg Oral Daily    Continuous Infusions:   PRN Meds: acetaminophen, albuterol, ipratropium-albuterol, morphine injection, oxyCODONE, senna-docusate, traZODone  Physical Exam Pulmonary:     Comments: On heated high flow cannula.  Neurological:     Mental Status: She is alert.             Vital Signs: BP 106/60 (BP Location: Right Arm)   Pulse 85   Temp 97.7 F (36.5 C) (Axillary)   Resp (!) 27   Ht 5\' 3"  (1.6 m)   Wt 93.6 kg   SpO2 99%   BMI 36.55 kg/m  SpO2: SpO2: 99 % O2 Device: O2 Device: (S) High Flow Nasal Cannula O2 Flow Rate: O2 Flow Rate (L/min): (S) 50 L/min  Intake/output summary:  Intake/Output Summary (Last 24 hours) at 08/22/2021 0954 Last data filed at 08/22/2021 0800 Gross per 24 hour  Intake 720 ml  Output 2150 ml  Net -1430 ml   LBM: Last BM Date : 08/19/21 Baseline Weight: Weight: 93.5 kg Most recent weight: Weight: 93.6 kg   Patient Active Problem List   Diagnosis Date Noted   Pericardial effusion    Permanent atrial fibrillation (HCC)    Sepsis due to pneumonia ([place]) 08/14/2021   HAP (hospital-acquired pneumonia) 08/14/2021   Severe pulmonary hypertension ([place]) 05/29/2021   OSA (obstructive sleep apnea) 05/29/2021   Elevated troponin 05/28/2021   Lower abdominal pain 05/28/2021   Nausea and vomiting 05/28/2021   Prolonged QT interval 05/28/2021   At risk for fall due to comorbid condition 05/02/2021   Pleural effusion    Acute kidney injury superimposed on CKD (HCC)    Impaired gait and mobility 12/20/2020   Pulmonary nodules/lesions, multiple 10/29/2020   Anemia in chronic kidney disease 10/29/2020   Aortic stenosis, moderate     AF (paroxysmal atrial fibrillation) (HCC)    Gastroesophageal reflux disease without esophagitis    Chronic respiratory failure with hypercapnia (HCC) 05/21/2020   Severe sepsis ([place]) 05/21/2020   UTI (urinary tract infection) 05/21/2020   Hyperglycemia due to type 2 diabetes mellitus ([place]) 05/21/2020   Abnormal CT of liver 05/21/2020   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 05/21/2020   Morbid obesity ([place]) 05/08/2020   Athscl heart disease 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 03/06/2020   Acute hip pain, left 10/23/2019   Lumbar stenosis with neurogenic claudication 10/23/2019   COPD with acute exacerbation ([place]) 92/01/9416   Acute diastolic CHF (congestive heart failure) ([place]) 09/28/2019   (HFpEF) heart failure with preserved ejection fraction ([place]) 09/27/2019   Hypokalemia 06/28/2019   Hyponatremia 06/28/2019   Uncontrolled type 2 diabetes mellitus with hyperglycemia, with long-term current use of insulin ([place]) 06/28/2019   CKD (chronic kidney disease), stage IIIa 06/28/2019   Atrial fibrillation, chronic ([place]) 06/28/2019   Pulmonary edema 02/27/2019   Bradycardia 02/24/2019   Acute on chronic diastolic CHF (congestive heart failure) ([place]) 02/23/2019   Chronic respiratory failure with hypoxia ([place]) 02/23/2019   Chest pain 02/13/2019   Osteopenia of neck of left femur 11/20/2018   AVM (arteriovenous malformation) of small bowel, acquired    Acute gastric ulcer with hemorrhage    Chronic diastolic heart failure (HCC) 08/22/2018   Diarrhea 08/22/2018   Junctional bradycardia    Acute on chronic heart failure with preserved ejection fraction (HFpEF) (HCC)    AKI (acute kidney injury) ([place])    Symptomatic bradycardia 08/05/2018   Acute on chronic respiratory failure ([place]) 06/25/2018   Diabetic peripheral neuropathy associated  with type 2 diabetes mellitus ([place]) 01/25/2018   History of non anemic vitamin B12 deficiency 01/25/2018   Personal history of kidney stones 11/12/2017    Urge incontinence 11/12/2017   History of leukocytosis 09/17/2017   GI bleed 08/26/2017   Arthritis 08/10/2017   Stage 4 chronic kidney disease ([place]) 08/10/2017   COPD (chronic obstructive pulmonary disease) ([place]) 08/10/2017   Diabetes mellitus type 2, uncomplicated ([place]) 00/86/7619   Hypertension 08/10/2017   Obesity (BMI 35.0-39.9 without comorbidity) 04/11/2017   Primary osteoarthritis of right knee 09/01/2016   Leucocytosis 10/19/2015   Asterixis 01/07/2015   Acute on chronic respiratory failure with hypoxia and hypercapnia (HCC) 01/07/2015   Sciatica 01/07/2015   Weakness 01/07/2015   Chronic 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 01/04/2015   Iron deficiency anemia 06/22/2014   Microalbuminuria 06/22/2014   CHF (congestive heart failure) ([place]) 04/20/2014   Edema, peripheral 04/20/2014    Palliative Care Assessment & Plan   Recommendations/Plan: Continue current care. Would want thoracentesis as needed but would not want pleurex drain placed.    Code Status:    Code Status Orders  (From admission, onward)           Start     Ordered   08/14/21 1844  Do not attempt resuscitation (DNR)  Continuous       Question Answer Comment  In the event of cardiac or respiratory ARREST Do not call a "code blue"   In the event of cardiac or respiratory ARREST Do not perform Intubation, CPR, defibrillation or ACLS   In the event of cardiac or respiratory ARREST Use medication by any route, position, wound care, and other measures to relive pain and suffering. May use oxygen, suction and manual treatment of airway obstruction as needed for comfort.      08/14/21 1849           Code Status History     Date Active Date Inactive Code Status Order ID Comments User Context   08/03/2021 1714 08/10/2021 2302 DNR [id number]  Leslee Home, DO ED   05/28/2021 1927 06/02/2021 2335 DNR [id number]  Norval Morton, MD ED   05/28/2021 1851 05/28/2021 1927 Full Code [id number]  Norval Morton, MD ED   02/03/2021 1805 02/12/2021 1902 DNR [id number]  Mercy Riding, MD Inpatient   02/01/2021 1653 02/03/2021 1805 Full Code [id number]  Collier Bullock, MD ED   10/29/2020 1650 11/09/2020 1747 Full Code [id number]  Cox, [place], DO ED   06/17/2020 2215 06/21/2020 2140 Full Code [id number]  Mansy, Arvella Merles, MD ED   05/21/2020 2251 05/25/2020 1950 Full Code [id number]  Athena Masse, MD Inpatient   09/28/2019 0120 10/02/2019 2116 DNR [id number]  Sueanne Margarita, [facility]   06/28/2019 1333 06/30/2019 1619 DNR [id number]  Ivor Costa, MD Inpatient   06/28/2019 1255 06/28/2019 1333 DNR [id number]  Ivor Costa, MD Inpatient   02/23/2019 1938 02/27/2019 1810 Full Code [id number]  Edwin Dada, MD Inpatient   02/13/2019 2011 02/16/2019 1819 DNR [id number]  Nicolette Bang, DO ED   09/14/2018 2220 09/17/2018 2350 DNR [id number]  Henreitta Leber, MD Inpatient   08/05/2018 1547 08/10/2018 1949 Full Code [id number]  Gladstone Lighter, MD Inpatient   06/25/2018 1417 06/30/2018 1557 Full Code [id number]  Epifanio Lesches, MD ED   09/15/2017 1747 09/18/2017 2106 DNR [id number]  Hillary Bow, MD ED   08/26/2017 2216 08/30/2017 1604 Full Code [id number]  Nicholes Mango, MD Inpatient   01/08/2015 [phone]  01/09/2015 1336 Full Code [id number]  Theodoro Grist, MD Inpatient      Advance Directive Documentation    Flowsheet Row Most Recent Value  Type of Advance Directive Out of facility DNR (pink MOST or yellow form)  Pre-existing out of facility DNR order (yellow form or pink MOST form) --  "MOST" Form in Place? --       Prognosis: Poor   Care plan was discussed with attending and RN  Thank you for allowing the Palliative Medicine Team to assist in the care of this patient.   Asencion Gowda, NP  Please contact Palliative Medicine Team phone at [phone] for questions and concerns.

## 2021-08-22 NOTE — Progress Notes (Signed)
   08/21/21 1953  Assess: MEWS Score  Temp 98.4 F (36.9 C)  BP (!) 110/56  MAP (mmHg) 70  Pulse Rate 97  Resp (!) 3  SpO2 98 %  O2 Device HFNC  O2 Flow Rate (L/min) 50 L/min  Assess: MEWS Score  MEWS Temp 0  MEWS Systolic 0  MEWS Pulse 0  MEWS RR 2  MEWS LOC 0  MEWS Score 2  MEWS Score Color Yellow  Assess: if the MEWS score is Yellow or Red  Were vital signs taken at a resting state? Yes  Focused Assessment No change from prior assessment  Does the patient meet 2 or more of the SIRS criteria? No  Does the patient have a confirmed or suspected source of infection? Yes  Provider and Rapid Response Notified? No  MEWS guidelines implemented *See Row Information* No, previously yellow, continue vital signs every 4 hours  Treat  MEWS Interventions Administered scheduled meds/treatments  Pain Scale 0-10  Pain Score Asleep  Pain Type Chronic pain  Pain Location Back  Pain Orientation Left  Pain Descriptors / Indicators Aching;Discomfort  Pain Frequency Constant  Pain Onset On-going  Patients Stated Pain Goal 0  Pain Intervention(s) Medication (See eMAR)  Constipation interventions Stool Softener  Notify: Charge Nurse/RN  Name of Charge Nurse/RN Notified Lindsey, RN  Date Charge Nurse/RN Notified 08/21/21  Time Charge Nurse/RN Notified 2000  Document  Progress note created (see row info) Yes  Assess: SIRS CRITERIA  SIRS Temperature  0  SIRS Pulse 1  SIRS Respirations  0  SIRS WBC 0  SIRS Score Sum  1

## 2021-08-22 NOTE — Progress Notes (Signed)
*  PRELIMINARY RESULTS* Echocardiogram 2D Echocardiogram has been performed.  Sherrie Sport 08/22/2021, 7:44 AM

## 2021-08-22 NOTE — Progress Notes (Signed)
PT Cancellation Note  Patient Details Name: TYSHANA NISHIDA MRN: [id number] DOB: 10-09-1945   Cancelled Treatment:     Pt had just returned from receiving thoracentesis and declined any activity. Will continue per POC next available date/time.   Josie Dixon 08/22/2021, 2:54 PM

## 2021-08-22 NOTE — Progress Notes (Signed)
PROGRESS NOTE    Karen [name]  AYT:[id number] DOB: [birth year] DOA: 08/14/2021 PCP: Margarita Rana, MD    Assessment & Plan:   Principal Problem:   HAP (hospital-acquired pneumonia) Active Problems:   Acute on chronic heart failure with preserved ejection fraction (HFpEF) (HCC)   Hyponatremia   Sepsis due to pneumonia ([employer])   Chronic respiratory failure with hypoxia (HCC)   Hypokalemia   Uncontrolled type 2 diabetes mellitus with hyperglycemia, with long-term current use of insulin (HCC)   AF (paroxysmal atrial fibrillation) (HCC)   Acute kidney injury superimposed on CKD (HCC)   OSA (obstructive sleep apnea)   COPD (chronic obstructive pulmonary disease) (HCC)   Chest pain   Gastroesophageal reflux disease without esophagitis   Pericardial effusion   Permanent atrial fibrillation ([place])  Assessment and Plan: Sepsis: secondary to hospital-acquired pneumonia. Was recently hospitalized here and was discharged to skilled nursing facility on 08/10/2021. Completed abx course. Resolved   Acute on chronic hypoxic respiratory failure: repeat CXR slight worsening of L sided pleural effusion. Uses 3L [place] at home but still on HFNC. Morphine prn. Has hx of CHF, COPD, OSA, pulmonary hypertension. BiPAP prn. Poor prognosis. High risk for cardiac and/or respiratory arrest      Acute on chronic diastolic CHF: echo done on 08/04/2021 showed normal EF, grade III diastolic dysfunction, TR. Continue on torsemide, aldactone as per cario. Monitor I/Os   COPD exacerbation: continue on bronchodilators, supplemental oxygen   PAF: continue on eliquis, cardizem   B/l pleural effusions: L>R. S/p L thoracentesis w/ 881mL of clear fluid removed on 08/16/21. Continue on torsemide, aldactone. Repeat CXR shows slight worsening of left sided pleural effusion. S/p repeat L thoracentesis on 08/22/21 w/ fluid studies pending. Only small right pleural effusion, unlikely enough to tap. Poor prognosis. High risk for  cardiac and/or respiratory arrest   Pericardial effusion: w/o cardiac tamponade physiology as per cardio. Repeat limited echo shows small pericardial effusion. Management as per cardio    Chronic hyponatremia: likely contributed to by hypervolemic hyponatremia from CHF and also hyperglycemia. WNL today   DM2: HbA1c 9.1, poorly controlled. Continue on glargine, aspart, & SSI w/ accuchecks    Hypokalemia: WNL today    OSA: CPAP qhs     GERD: continue on PPI    Chest pain:  EKG showed sinus tachycardia.  Chest wall was tender on palpitation.  Most likely atypical chest pain.  Troponins are negative. Resolved    AKI on CKD stage IIIa: Cr is labile. Avoid nephrotoxic meds    Morbid obesity: BMI 36.3. Complicates overall care & prognosis        DVT prophylaxis: SCDs, eliquis  Code Status:  DNR Family Communication: discussed pt's care w/ pt's husband, Ray, and answered his questions. Dicussed poor prognosis w/ pt's husband, Ray  Disposition Plan: PT recs SNF   Level of care: Progressive  Status is: Inpatient Remains inpatient appropriate because: severity of illness, still on HFNC & s/p repeat L thoracentesis today     Consultants:  Cardio  Palliative care   Procedures:   Antimicrobials:   Subjective: Pt c/o significant shortness of breath   Objective: Vitals:   08/22/21 0352 08/22/21 0400 08/22/21 0408 08/22/21 0542  BP: 131/60     Pulse: 84 88  93  Resp: (!) 31 20  (!) 27  Temp:      TempSrc:      SpO2: 94% 97% 97% 96%  Weight:    93.6 kg  Height:  5\' 3"  (1.6 m)    Intake/Output Summary (Last 24 hours) at 08/22/2021 0745 Last data filed at 08/21/2021 1953 Gross per 24 hour  Intake 480 ml  Output 1900 ml  Net -1420 ml   Filed Weights   08/19/21 0440 08/20/21 0553 08/22/21 0542  Weight: 92.6 kg 93.2 kg 93.6 kg    Examination:  General exam: Appears uncomfortable. Accessory muscle use   Respiratory system: decreased breath sounds b/l   Cardiovascular system: irregularly irregular. No rubs or gallops  Gastrointestinal system: Abd is soft, NT, obese & hypoactive bowel sounds   Central nervous system: Alert and oriented. Moves all extremities  Psychiatry: Judgement and insight appear at baseline. Flat mood and affect     Data Reviewed: I have personally reviewed following labs and imaging studies  CBC: Recent Labs  Lab 08/18/21 0553 08/19/21 0606 08/20/21 0435 08/21/21 0455 08/22/21 0548  WBC 17.2* 17.0* 17.0* 17.3* 16.0*  HGB 10.6* 10.1* 10.3* 10.1* 10.5*  HCT 33.0* 31.6* 32.6* 32.8* 34.2*  MCV 88.9 87.5 89.3 90.4 89.3  PLT 367 341 340 337 967   Basic Metabolic Panel: Recent Labs  Lab 08/18/21 0553 08/19/21 0606 08/20/21 0435 08/21/21 0455 08/22/21 0548  NA 135 133* 135 136 136  K 3.4* 3.5 4.4 4.0 3.8  CL 93* 92* 91* 90* 89*  CO2 33* 34* 35* 36* 38*  GLUCOSE 142* 135* 195* 161* 99  BUN 39* 31* 34* 34* 34*  CREATININE 0.80 0.80 1.02* 0.93 1.00  CALCIUM 8.6* 8.7* 8.6* 8.4* 9.0   GFR: Estimated Creatinine Clearance: 51.2 mL/min (by C-G formula based on SCr of 1 mg/dL). Liver Function Tests: No results for input(s): "AST", "ALT", "ALKPHOS", "BILITOT", "PROT", "ALBUMIN" in the last 168 hours.  No results for input(s): "LIPASE", "AMYLASE" in the last 168 hours. No results for input(s): "AMMONIA" in the last 168 hours. Coagulation Profile: No results for input(s): "INR", "PROTIME" in the last 168 hours. Cardiac Enzymes: No results for input(s): "CKTOTAL", "CKMB", "CKMBINDEX", "TROPONINI" in the last 168 hours. BNP (last 3 results) No results for input(s): "PROBNP" in the last 8760 hours. HbA1C: No results for input(s): "HGBA1C" in the last 72 hours. CBG: Recent Labs  Lab 08/20/21 2357 08/21/21 0809 08/21/21 1229 08/21/21 1644 08/21/21 2041  GLUCAP 146* 184* 242* 160* 114*   Lipid Profile: No results for input(s): "CHOL", "HDL", "LDLCALC", "TRIG", "CHOLHDL", "LDLDIRECT" in the last 72  hours. Thyroid Function Tests: No results for input(s): "TSH", "T4TOTAL", "FREET4", "T3FREE", "THYROIDAB" in the last 72 hours. Anemia Panel: No results for input(s): "VITAMINB12", "FOLATE", "FERRITIN", "TIBC", "IRON", "RETICCTPCT" in the last 72 hours. Sepsis Labs: No results for input(s): "PROCALCITON", "LATICACIDVEN" in the last 168 hours.   Recent Results (from the past 240 hour(s))  Blood culture (routine x 2)     Status: None   Collection Time: 08/14/21  4:53 PM   Specimen: BLOOD  Result Value Ref Range Status   Specimen Description BLOOD [facility]  Final   Special Requests BOTTLES DRAWN AEROBIC AND ANAEROBIC BCLV  Final   Culture   Final    NO GROWTH 5 DAYS Performed at [facility], [address], [place], [place] [address]    Report Status 08/19/2021 FINAL  Final  Blood culture (routine x 2)     Status: None   Collection Time: 08/14/21  4:55 PM   Specimen: BLOOD  Result Value Ref Range Status   Specimen Description BLOOD RIGHT ANTECUBITAL  Final   Special Requests  Final    BOTTLES DRAWN AEROBIC AND ANAEROBIC Blood Culture adequate volume   Culture   Final    NO GROWTH 5 DAYS Performed at [facility], [address] [place], [place] [address]    Report Status 08/19/2021 FINAL  Final  MRSA Next Gen by PCR, Nasal     Status: None   Collection Time: 08/15/21  5:50 PM   Specimen: Nasal Mucosa; Nasal Swab  Result Value Ref Range Status   MRSA by PCR Next Gen NOT DETECTED NOT DETECTED Final    Comment: (NOTE) The GeneXpert MRSA Assay (FDA approved for NASAL specimens only), is one component of a comprehensive MRSA colonization surveillance program. It is not intended to diagnose MRSA infection nor to guide or monitor treatment for MRSA infections. Test performance is not FDA approved in patients less than 88 years old. Performed at [facility], [address] [place], [place] [address]   Body fluid culture w Gram Stain     Status: None    Collection Time: 08/16/21  4:20 PM   Specimen: PATH Cytology Pleural fluid  Result Value Ref Range Status   Specimen Description   Final    PLEURAL Performed at [facility], [address], [place], [place] [address]    Special Requests   Final    PLEURAL Performed at [facility], [address] [place], [place] [address]    Gram Stain   Final    CYTOSPIN SMEAR WBC PRESENT,BOTH PMN AND MONONUCLEAR NO ORGANISMS SEEN    Culture   Final    NO GROWTH 3 DAYS Performed at [facility], [place] [address] [place], [place] [address]    Report Status 08/20/2021 FINAL  Final         Radiology Studies: DG Chest Port 1 View  Result Date: 08/22/2021 CLINICAL DATA:  Hypoxia. EXAM: PORTABLE CHEST 1 VIEW COMPARISON:  08/21/2021 FINDINGS: 0410 hours. The cardio pericardial silhouette is enlarged. Bibasilar collapse/consolidation is similar to prior with bilateral pleural effusions, left greater than right also not substantially changed. Mild vascular congestion noted bilaterally with some improvement in aeration of the upper lungs. The visualized bony structures of the thorax are unremarkable. Telemetry leads overlie the chest. IMPRESSION: Stable exam. Bibasilar collapse/consolidation with bilateral pleural effusions, left greater than right. Electronically Signed   By: Misty Stanley M.D.   On: 08/22/2021 05:31   DG Chest Port 1 View  Result Date: 08/21/2021 CLINICAL DATA:  Dyspnea. EXAM: PORTABLE CHEST 1 VIEW COMPARISON:  08/16/2021 FINDINGS: Lordotic technique is demonstrated. Lungs are adequately inflated demonstrate hazy opacification over the mid to lower lungs bilaterally likely bilateral effusions, left worse than right with associated basilar atelectasis. The left effusion demonstrate slight interval worsening. Infection or edema over the mid lungs is also possible. Stable cardiomegaly. Remainder of the exam is unchanged. IMPRESSION: 1. Hazy opacification over  the mid to lower lungs bilaterally likely effusions with associated basilar atelectasis. Slight interval worsening over the left effusion. Infection or edema over the mid lungs is also possible. 2. Stable cardiomegaly. Electronically Signed   By: Marin Olp M.D.   On: 08/21/2021 08:42   ECHOCARDIOGRAM LIMITED  Result Date: 08/20/2021    ECHOCARDIOGRAM LIMITED REPORT   Patient Name:   Karen [name] Date of Exam: 08/20/2021 Medical Rec #:  [id number]        Height:       63.0 in Accession #:    [id number]  Weight:       205.4 lb Date of Birth:  March 15, 1945       BSA:          1.956 m Patient Age:    29 years         BP:           111/43 mmHg Patient Gender: F                HR:           68 bpm. Exam Location:  [facility] Procedure: Limited Echo and Cardiac Doppler Indications:     Pericardial Effusion I31.3  History:         Patient has prior history of Echocardiogram examinations, most                  recent 08/16/2021.  Sonographer:     Kathlen Brunswick RDCS Referring Phys:  [place] Diagnosing Phys: Kate Sable MD IMPRESSIONS  1. Left ventricular ejection fraction, by estimation, is 55 to 60%. The left ventricle has normal function.  2. Right ventricular systolic function is low normal.  3. A small pericardial effusion is present. Large pleural effusion in the left lateral region.  4. The aortic valve is calcified.  5. The inferior vena cava is dilated in size with <50% respiratory variability, suggesting right atrial pressure of 15 mmHg. FINDINGS  Left Ventricle: Left ventricular ejection fraction, by estimation, is 55 to 60%. The left ventricle has normal function. The left ventricular internal cavity size was normal in size. There is no left ventricular hypertrophy. Right Ventricle: No increase in right ventricular wall thickness. Right ventricular systolic function is low normal. Pericardium: A small pericardial effusion is present. Aortic Valve: The aortic valve is calcified.  Venous: The inferior vena cava is dilated in size with less than 50% respiratory variability, suggesting right atrial pressure of 15 mmHg. Additional Comments: There is a large pleural effusion in the left lateral region. LEFT VENTRICLE PLAX 2D LVIDd:         5.15 cm LVIDs:         3.82 cm LV PW:         1.12 cm LV IVS:        0.98 cm  LEFT ATRIUM         Index LA diam:    4.60 cm 2.35 cm/m Kate Sable MD Electronically signed by Kate Sable MD Signature Date/Time: 08/20/2021/12:12:38 PM    Final         Scheduled Meds:  allopurinol  100 mg Oral Daily   apixaban  5 mg Oral BID   atorvastatin  10 mg Oral Daily   budesonide (PULMICORT) nebulizer solution  0.25 mg Nebulization BID   chlorhexidine  15 mL Mouth Rinse BID   diltiazem  180 mg Oral Daily   empagliflozin  10 mg Oral Daily   ferrous sulfate  325 mg Oral Q breakfast   fluticasone  2 spray Each Nare Daily   insulin aspart  0-20 Units Subcutaneous TID WC   insulin aspart  10 Units Subcutaneous TID WC   insulin glargine-yfgn  25 Units Subcutaneous BID   ipratropium  2 spray Each Nare QID   lidocaine  1 patch Transdermal Q24H   mouth rinse  15 mL Mouth Rinse q12n4p   melatonin  5 mg Oral QHS   montelukast  10 mg Oral QHS   pantoprazole  40 mg Oral Daily   spironolactone  12.5 mg Oral Daily   torsemide  40 mg Oral BID   umeclidinium-vilanterol  1 puff Inhalation Daily   vitamin B-12  500 mcg Oral Daily   Continuous Infusions:     LOS: 8 days    Time spent: 35 mins     Wyvonnia Dusky, MD [facility] Pager [phone]  If 7PM-7AM, please contact night-coverage 08/22/2021, 7:45 AM

## 2021-08-23 DIAGNOSIS — I5033 Acute on chronic diastolic (congestive) heart failure: Secondary | ICD-10-CM | POA: Diagnosis not present

## 2021-08-23 DIAGNOSIS — J90 Pleural effusion, not elsewhere classified: Secondary | ICD-10-CM | POA: Diagnosis not present

## 2021-08-23 DIAGNOSIS — J432 Centrilobular emphysema: Secondary | ICD-10-CM | POA: Diagnosis not present

## 2021-08-23 LAB — BASIC METABOLIC PANEL
Anion gap: 11 (ref 5–15)
BUN: 46 mg/dL — ABNORMAL HIGH (ref 8–23)
CO2: 38 mmol/L — ABNORMAL HIGH (ref 22–32)
Calcium: 9.1 mg/dL (ref 8.9–10.3)
Chloride: 88 mmol/L — ABNORMAL LOW (ref 98–111)
Creatinine, Ser: 1.11 mg/dL — ABNORMAL HIGH (ref 0.44–1.00)
GFR, Estimated: 51 mL/min — ABNORMAL LOW (ref >60)
Glucose, Bld: 90 mg/dL (ref 70–99)
Potassium: 4 mmol/L (ref 3.5–5.1)
Sodium: 137 mmol/L (ref 135–145)

## 2021-08-23 LAB — GLUCOSE, CAPILLARY
Glucose-Capillary: 116 mg/dL — ABNORMAL HIGH (ref 70–99)
Glucose-Capillary: 248 mg/dL — ABNORMAL HIGH (ref 70–99)
Glucose-Capillary: 270 mg/dL — ABNORMAL HIGH (ref 70–99)
Glucose-Capillary: 241 mg/dL — ABNORMAL HIGH (ref 70–99)

## 2021-08-23 LAB — CBC
HCT: 32.9 % — ABNORMAL LOW (ref 36.0–46.0)
Hemoglobin: 10.4 g/dL — ABNORMAL LOW (ref 12.0–15.0)
MCH: 28.3 pg (ref 26.0–34.0)
MCHC: 31.6 g/dL (ref 30.0–36.0)
MCV: 89.6 fL (ref 80.0–100.0)
Platelets: 337 10*3/uL (ref 150–400)
RBC: 3.67 MIL/uL — ABNORMAL LOW (ref 3.87–5.11)
RDW: 14.8 % (ref 11.5–15.5)
WBC: 17.9 10*3/uL — ABNORMAL HIGH (ref 4.0–10.5)
nRBC: 0 % (ref 0.0–0.2)

## 2021-08-23 LAB — CYTOLOGY - NON PAP

## 2021-08-23 NOTE — Progress Notes (Signed)
   08/23/21 0100  Assess: MEWS Score  Temp 99.1 F (37.3 C)  BP (!) 109/59  MAP (mmHg) 72  Pulse Rate 72  Resp (!) 30  SpO2 96 %  O2 Device Bi-PAP  Assess: MEWS Score  MEWS Temp 0  MEWS Systolic 0  MEWS Pulse 0  MEWS RR 2  MEWS LOC 0  MEWS Score 2  MEWS Score Color Yellow  Assess: if the MEWS score is Yellow or Red  Were vital signs taken at a resting state? Yes  Focused Assessment No change from prior assessment  Does the patient meet 2 or more of the SIRS criteria? No  Does the patient have a confirmed or suspected source of infection? Yes  Provider and Rapid Response Notified? No  MEWS guidelines implemented *See Row Information* No, previously yellow, continue vital signs every 4 hours  Treat  MEWS Interventions Administered scheduled meds/treatments  Pain Scale 0-10  Pain Score 7  Pain Type Chronic pain  Pain Location Back  Pain Descriptors / Indicators Aching;Discomfort  Pain Frequency Constant  Pain Onset On-going  Pain Intervention(s) Medication (See eMAR)  Notify: Charge Nurse/RN  Name of Charge Nurse/RN Notified Conservation officer, historic buildings, RN  Date Charge Nurse/RN Notified 08/22/21  Time Charge Nurse/RN Notified 0100  Assess: SIRS CRITERIA  SIRS Temperature  0  SIRS Pulse 0  SIRS Respirations  1  SIRS WBC 0  SIRS Score Sum  1

## 2021-08-23 NOTE — Progress Notes (Signed)
Progress Note  Patient Name: Karen [name] Date of Encounter: 08/23/2021  [facility] Cardiologist: Ida Rogue, MD   Subjective   Patient seen on AM rounds. Patient states that she feels lousy. Denies any chest pain or worsening shortness of breath. Is visibly fatigued during rounds. Remains on heated high flow oxygen with O2 sats 95-97%. Husband is at the bedside. Urine output -1.7 L in the last 24 hours. Left sided thoracentesis completed on 08/22/2021 with 850 mls of serous fluid removed  Inpatient Medications    Scheduled Meds:  allopurinol  100 mg Oral Daily   apixaban  5 mg Oral BID   atorvastatin  10 mg Oral Daily   budesonide (PULMICORT) nebulizer solution  0.25 mg Nebulization BID   chlorhexidine  15 mL Mouth Rinse BID   diltiazem  180 mg Oral Daily   empagliflozin  10 mg Oral Daily   ferrous sulfate  325 mg Oral Q breakfast   fluticasone  2 spray Each Nare Daily   insulin aspart  0-20 Units Subcutaneous TID WC   insulin aspart  10 Units Subcutaneous TID WC   insulin glargine-yfgn  25 Units Subcutaneous BID   ipratropium  2 spray Each Nare QID   lidocaine  1 patch Transdermal Q24H   mouth rinse  15 mL Mouth Rinse q12n4p   melatonin  5 mg Oral QHS   montelukast  10 mg Oral QHS   pantoprazole  40 mg Oral Daily   spironolactone  12.5 mg Oral Daily   torsemide  40 mg Oral BID   umeclidinium-vilanterol  1 puff Inhalation Daily   vitamin B-12  500 mcg Oral Daily   Continuous Infusions:  PRN Meds: acetaminophen, albuterol, ipratropium-albuterol, morphine injection, oxyCODONE, senna-docusate, traZODone   Vital Signs    Vitals:   08/23/21 0100 08/23/21 0457 08/23/21 0642 08/23/21 0825  BP: (!) 109/59 (!) 112/57  114/90  Pulse: 72 64  82  Resp: (!) 30 (!) 24  16  Temp: 99.1 F (37.3 C) 98.8 F (37.1 C)  98.2 F (36.8 C)  TempSrc: Oral Oral  Oral  SpO2: 96% 97% 98% 100%  Weight:  92.5 kg    Height:        Intake/Output Summary (Last 24 hours) at  08/23/2021 1021 Last data filed at 08/23/2021 0100 Gross per 24 hour  Intake --  Output 900 ml  Net -900 ml      08/23/2021    4:57 AM 08/22/2021    5:42 AM 08/20/2021    5:53 AM  Last 3 Weights  Weight (lbs) 203 lb 14.4 oz 206 lb 5.6 oz 205 lb 6.4 oz  Weight (kg) 92.488 kg 93.6 kg 93.169 kg      Telemetry    Atrial fibrillation and atrial flutter rate controlled 70-110 - Personally Reviewed  ECG    No new tracings - Personally Reviewed  Physical Exam   GEN: No acute distress but is visibly weak and fatigued Neck: JVD difficult to assess due to body habitus Cardiac: Irregularly irregular, without rubs or gallops Respiratory: Diminished breath sounds bilaterally, without wheezing, remains on heated high flow O2 GI: Soft, nontender, non-distended, obese, bowel sounds present in all 4 quadrants MS: No edema; No deformity. Neuro:  Nonfocal  Psych: Normal affect   Labs    High Sensitivity Troponin:   Recent Labs  Lab 08/03/21 1324 08/03/21 1549 08/14/21 1518 08/14/21 1653  TROPONINIHS 11 10 16 12      Chemistry Recent Labs  Lab 08/21/21 0455 08/22/21 0548 08/23/21 0549  NA 136 136 137  K 4.0 3.8 4.0  CL 90* 89* 88*  CO2 36* 38* 38*  GLUCOSE 161* 99 90  BUN 34* 34* 46*  CREATININE 0.93 1.00 1.11*  CALCIUM 8.4* 9.0 9.1  GFRNONAA >60 58* 51*  ANIONGAP 10 9 11     Lipids No results for input(s): "CHOL", "TRIG", "HDL", "LABVLDL", "LDLCALC", "CHOLHDL" in the last 168 hours.  Hematology Recent Labs  Lab 08/21/21 0455 08/22/21 0548 08/23/21 0549  WBC 17.3* 16.0* 17.9*  RBC 3.63* 3.83* 3.67*  HGB 10.1* 10.5* 10.4*  HCT 32.8* 34.2* 32.9*  MCV 90.4 89.3 89.6  MCH 27.8 27.4 28.3  MCHC 30.8 30.7 31.6  RDW 14.7 14.7 14.8  PLT 337 383 337   Thyroid No results for input(s): "TSH", "FREET4" in the last 168 hours.  BNPNo results for input(s): "BNP", "PROBNP" in the last 168 hours.  DDimer No results for input(s): "DDIMER" in the last 168 hours.   Radiology   Thoracentesis completed on 08/22/2021 Successful ultrasound-guided LEFT therapeutic thoracentesis yielding 850 mL of pleural fluid.   CXR completed on 08/22/2021 1. Interval removal of LEFT pleural fluid with trace residual. No pneumothorax 2. Otherwise unchanged pulmonary findings, with cardiomegaly and mild pulmonary edema with small volume RIGHT pleural effusion.  Cardiac Studies   Limited echocardiogram completed on 08/22/2021 1. Left ventricular ejection fraction, by estimation, is 50 to 55%. The  left ventricle has low normal function. The left ventricle has no regional  wall motion abnormalities. Left ventricular diastolic function could not  be evaluated.   2. Right ventricular systolic function is normal. The right ventricular  size is normal. There is moderately elevated pulmonary artery systolic  pressure.   3. A small pericardial effusion is present. The pericardial effusion is  circumferential. Moderate pleural effusion in the left lateral region.   4. The mitral valve is normal in structure. No evidence of mitral valve  regurgitation. No evidence of mitral stenosis.   5. The aortic valve is normal in structure. Aortic valve regurgitation is  not visualized. No aortic stenosis is present.   6. The inferior vena cava is dilated in size with <50% respiratory  variability, suggesting right atrial pressure of 15 mmHg.   Patient Profile     76 y.o. female with a history of HFpEF, COPD, obesity, respiratory failure, presenting with shortness of breath and edema, diagnosed with pneumonia and respiratory failure being seen for A-fib RVR, HFpEF, and pericardial effusion.  Assessment & Plan    HFpEF with bilateral pleural effusions -s/p thoracentesis which removed 850 mls of serous fluid, cytology pending, patient refused pleurx catheter placement yesterday, husband states today that the patient agrees to tube placement -net -1.7 over the last 24 hours -continue torsemide 40  mg bid, daily BMP while on daily dieretics  -of note today creatinine increased this morning from baseline to 1.11 -continue jardiance -continue spirolactone -daily weights, strict I&O, low sodium diet - 2. Pericardial effusion -repeat echocardiogram on 08/22/2021 revealed EF 50-55% with small pericardial effusion without tamponade   -continue to monitor to reoccurrence since back on eliquis  3.Permanent atrial fibrillation -rate controlled -continue cardizem 180 mg daily -continue eliquis 5 mg bid  4.Acute on chronic hypoxic respiratory failure -multifactorial including HFpEF versus bilateral pleural effusions and pericardial effusion, hospital acquired PNA, and COPD -continues on heated high flow oxygen -titrate FiO2 to maintain O2 sats greater than equal to 90% -it is reasonable at this  time to have palliative care consult with patient only making small advances and improvements   For questions or updates, please contact [place] Please consult [email or web address] for contact info under        Signed, Fanny Agan, NP  08/23/2021, 10:21 AM

## 2021-08-23 NOTE — Plan of Care (Signed)
  Problem: Education: Goal: Knowledge of General Education information will improve Description: Including pain rating scale, medication(s)/side effects and non-pharmacologic comfort measures Outcome: Progressing   Problem: Health Behavior/Discharge Planning: Goal: Ability to manage health-related needs will improve Outcome: Progressing   Problem: Clinical Measurements: Goal: Ability to maintain clinical measurements within normal limits will improve Outcome: Progressing Goal: Will remain free from infection Outcome: Progressing Goal: Diagnostic test results will improve Outcome: Progressing Goal: Respiratory complications will improve Outcome: Progressing Goal: Cardiovascular complication will be avoided Outcome: Progressing   Problem: Activity: Goal: Risk for activity intolerance will decrease Outcome: Progressing   Problem: Nutrition: Goal: Adequate nutrition will be maintained Outcome: Progressing   Problem: Coping: Goal: Level of anxiety will decrease Outcome: Progressing   Problem: Elimination: Goal: Will not experience complications related to bowel motility Outcome: Progressing Goal: Will not experience complications related to urinary retention Outcome: Progressing   Problem: Pain Managment: Goal: General experience of comfort will improve Outcome: Progressing   Problem: Safety: Goal: Ability to remain free from injury will improve Outcome: Progressing   Problem: Skin Integrity: Goal: Risk for impaired skin integrity will decrease Outcome: Progressing   Problem: Education: Goal: Ability to describe self-care measures that may prevent or decrease complications (Diabetes Survival Skills Education) will improve Outcome: Progressing Goal: Individualized Educational Video(s) Outcome: Progressing   Problem: Coping: Goal: Ability to adjust to condition or change in health will improve Outcome: Progressing   Problem: Fluid Volume: Goal: Ability to  maintain a balanced intake and output will improve Outcome: Progressing   Problem: Health Behavior/Discharge Planning: Goal: Ability to identify and utilize available resources and services will improve Outcome: Progressing Goal: Ability to manage health-related needs will improve Outcome: Progressing   Problem: Metabolic: Goal: Ability to maintain appropriate glucose levels will improve Outcome: Progressing   Problem: Nutritional: Goal: Maintenance of adequate nutrition will improve Outcome: Progressing Goal: Progress toward achieving an optimal weight will improve Outcome: Progressing   Problem: Skin Integrity: Goal: Risk for impaired skin integrity will decrease Outcome: Progressing   Problem: Tissue Perfusion: Goal: Adequacy of tissue perfusion will improve Outcome: Progressing   Problem: Education: Goal: Ability to describe self-care measures that may prevent or decrease complications (Diabetes Survival Skills Education) will improve Outcome: Progressing Goal: Individualized Educational Video(s) Outcome: Progressing   Problem: Coping: Goal: Ability to adjust to condition or change in health will improve Outcome: Progressing   Problem: Fluid Volume: Goal: Ability to maintain a balanced intake and output will improve Outcome: Progressing   Problem: Health Behavior/Discharge Planning: Goal: Ability to identify and utilize available resources and services will improve Outcome: Progressing Goal: Ability to manage health-related needs will improve Outcome: Progressing   Problem: Metabolic: Goal: Ability to maintain appropriate glucose levels will improve Outcome: Progressing   Problem: Nutritional: Goal: Maintenance of adequate nutrition will improve Outcome: Progressing Goal: Progress toward achieving an optimal weight will improve Outcome: Progressing   Problem: Skin Integrity: Goal: Risk for impaired skin integrity will decrease Outcome: Progressing    Problem: Tissue Perfusion: Goal: Adequacy of tissue perfusion will improve Outcome: Progressing   

## 2021-08-23 NOTE — TOC Progression Note (Signed)
Transition of Care [facility]) - Progression Note    Patient Details  Name: Karen [name] MRN: [id number] Date of Birth: [birth year]  Transition of Care [facility]) CM/SW [place], [place] Phone Number: 08/23/2021, 1:40 PM  Clinical Narrative:     TOC continues to follow for discharge planning. Patient originally from [facility].   Patient with poor prognosis, 45L HFNC, Palliative following closely. Patient wishes to continue full scope of care.   Dispo tbd at this time pending patient's medical improvements. Patient will need to be 6L O2 or less to return to SNF.         Expected Discharge Plan and Services                                                 Social Determinants of Health (SDOH) Interventions    Readmission Risk Interventions    10/30/2020    4:17 PM 06/21/2020    3:03 PM 09/30/2019   11:16 AM  Readmission Risk Prevention Plan  Transportation Screening Complete Complete Complete  PCP or Specialist Appt within 3-5 Days   Complete  Social Work Consult for [place] Planning/Counseling   Complete  Palliative Care Screening   Not Applicable  Medication Review Press photographer) Complete Complete Complete  PCP or Specialist appointment within 3-5 days of discharge Complete Complete   HRI or Home Care Consult Patient refused Complete   SW Recovery Care/Counseling Consult Complete Complete   Palliative Care Screening Not Applicable Not [place] Not Applicable Not Applicable

## 2021-08-23 NOTE — Progress Notes (Signed)
PROGRESS NOTE   HPI was taken from Dr. Linda Hedges: Karen [name] is a 76 y.o. female with a past medical history of HFpEF, anemia, CKD 4, COPD, diabetes, hypertension, hyperlipidemia recently hospitalized and d/c'd 08/10/21 to rehab.  She presents to the emergency department for left chest pain chest pain starting 2 days ago but worse today.  According to the patient she was diagnosed with pneumonia 2 to 3 days ago, since that time she has been experiencing pain in her chest as well as some mild shortness of breath.  Patient states she wears 3 L of oxygen chronically currently satting 93% on 3 L during my evaluation.  Patient states pain in the chest worse when she coughs. She denies prior MI.    ED Course: Patient presents with tachypnea, leukocytosis, SOB, reported dx of PNA at in-patient rehab.  She is noted to have AKI. Lactic acid elevated to 2.5. Code sepsis activated. Patient received 700 cc fluid bolus - stopped due to X-ray findings of CHF. She received cefepime and vanomycin.She was in a fib with RVR and received IV dose of diltiazem 10 mg. Her glucose was markedly elevated, K was low, Na was low. Troponins 16, 12. Patient was stablized. TRH called to admit for continued evaluation and treatment  As per Dr. Tawanna Solo: Patient is a 76 year old female with history of diastolic congestive heart failure, chronic anemia, CKD stage IV, OSA,COPD, diabetes type 2, hypertension, hyperlipidemia who was recently hospitalized here and was discharged on 08/10/2021 to SNF presented again with left-sided chest pain, shortness of breath.  She is on 3 L of oxygen chronically.  On presentation she was tachypneic, short of breath.  She was recently diagnosed with pneumonia in the skilled nursing facility about 2 to 3 days ago before she presented to the emergency department.  Lab work showed AKI, leukocytosis, hyponatremia, elevated lactate.  Code sepsis was activated.  Given IV fluids but stopped after findings showed  features of CHF exacerbation.  She was started on broad-spectrum antibiotics.  She was also noted to be in A-fib with RVR on presentation and was started on Cardizem drip.  Cardiology consulted and following.  Hospital course remarkable for persistent requirement of high flow oxygen/BiPAP.  Palliative care consulted due to poor prognosis.  As per Dr. Jimmye Norman 6/14-6/20/23: Pt unfortunately has not made much progress over the past week. Pt is still on HFNC and it has been difficult to wean the pt down. Pt is s/p L thoracentesis x 2, first on 08/16/21 and second on 08/22/21. Fluid studies were sent w/ the second thoracentesis. Pleural fluid cx NGTD. Pleural fluid cytology is pending still. Pt initially refused a pleurx cath but today is agreeable if one is needed. Palliative care as well as myself have discussed comfort care with pt. Pt is only agreeable to DNR currently but wants to continue care      Karen [name]  JKK:[id number] DOB: 29-Feb-1946 DOA: 08/14/2021 PCP: Margarita Rana, MD    Assessment & Plan:   Principal Problem:   HAP (hospital-acquired pneumonia) Active Problems:   Acute on chronic heart failure with preserved ejection fraction (HFpEF) (HCC)   Hyponatremia   Sepsis due to pneumonia ([place])   Chronic respiratory failure with hypoxia ([place])   Hypokalemia   Uncontrolled type 2 diabetes mellitus with hyperglycemia, with long-term current use of insulin (HCC)   AF (paroxysmal atrial fibrillation) ([place])   Acute kidney injury superimposed on CKD (HCC)   OSA (obstructive sleep apnea)  COPD (chronic obstructive pulmonary disease) (HCC)   Chest pain   Gastroesophageal reflux disease without esophagitis   Pericardial effusion   Permanent atrial fibrillation (HCC)  Assessment and Plan: Sepsis: secondary to hospital-acquired pneumonia. Was recently hospitalized here and was discharged to skilled nursing facility on 08/10/2021. Completed abx course. Resolved   Acute on chronic hypoxic  respiratory failure: repeat CXR slight worsening of L sided pleural effusion. Uses 3L [place] at home but still on HFNC. Morphine prn. Has hx of CHF, COPD, OSA, pulmonary hypertension. BiPAP prn. Poor prognosis. High risk for cardiac and/or respiratory arrest   Acute on chronic diastolic CHF: echo done on 08/04/2021 showed normal EF, grade III diastolic dysfunction, TR. Continue on aldactone, torsemide as per cardio. Monitor I/Os  COPD exacerbation:  continue on bronchodilators & supplemental oxygen   PAF: continue on cardizem, eliquis   B/l pleural effusions: L>R. S/p L thoracentesis w/ 81mL of clear fluid removed on 08/16/21. Continue on torsemide, aldactone. Repeat CXR shows slight worsening of left sided pleural effusion. S/p repeat L thoracentesis on 08/22/21. Pleural fluid NGTD. Pleural fluid cytology is pending. If left pleural effusion re-accumulates pt is now agreeable to pleurx catheter. Only small right pleural effusion, unlikely enough to tap. Poor prognosis. High risk for cardiac and/or respiratory arrest   Pericardial effusion: w/o cardiac tamponade physiology as per cardio. Repeat limited echo shows small pericardial effusion. Management as per cardio    Chronic hyponatremia: likely contributed to by hypervolemic hyponatremia from CHF and also hyperglycemia.  WNL today again   DM2: poorly controlled, HbA1c 9.1. Continue on glargine, aspart, & SSI w/ accuchecks    Hypokalemia: WNL today    OSA: CPAP qhs     GERD: continue on PPI    Chest pain:  EKG showed sinus tachycardia.  Chest wall was tender on palpitation.  Most likely atypical chest pain.  Troponins are negative. Resolved    AKI on CKD stage IIIa: Cr is labile. Avoid nephrotoxic meds    Morbid obesity: BMI 36.1. Complicates overall care & prognosis        DVT prophylaxis: SCDs, eliquis  Code Status:  DNR Family Communication: discussed pt's care w/ pt's husband, [place], and answered his questions. Dicussed poor prognosis  w/ pt's husband, [place]  Disposition Plan: PT recs SNF   Level of care: Progressive  Status is: Inpatient Remains inpatient appropriate because: severity of illness, still on HFNC     Consultants:  Cardio  Palliative care   Procedures:   Antimicrobials:   Subjective: Pt c/o shortness of breath   Objective: Vitals:   08/23/21 0027 08/23/21 0100 08/23/21 0457 08/23/21 0642  BP:  (!) 109/59 (!) 112/57   Pulse: 74 72 64   Resp:  (!) 30 (!) 24   Temp:  99.1 F (37.3 C) 98.8 F (37.1 C)   TempSrc:  Oral Oral   SpO2: 97% 96% 97% 98%  Weight:   92.5 kg   Height:        Intake/Output Summary (Last 24 hours) at 08/23/2021 0737 Last data filed at 08/23/2021 0100 Gross per 24 hour  Intake 480 ml  Output 1650 ml  Net -1170 ml   Filed Weights   08/20/21 0553 08/22/21 0542 08/23/21 0457  Weight: 93.2 kg 93.6 kg 92.5 kg    Examination:  General exam: Appears calm but uncomfortable.   Respiratory system: diminished breath sounds b/l  Cardiovascular system: irregularly irregular. No rubs or clicks  Gastrointestinal system: Abd is soft, NT,  obese & hypoactive bowel sounds  Central nervous system: alert and oriented. Moves all extremities  Psychiatry: Judgement and insight appears at baseline. Flat mood and affect     Data Reviewed: I have personally reviewed following labs and imaging studies  CBC: Recent Labs  Lab 08/19/21 0606 08/20/21 0435 08/21/21 0455 08/22/21 0548 08/23/21 0549  WBC 17.0* 17.0* 17.3* 16.0* 17.9*  HGB 10.1* 10.3* 10.1* 10.5* 10.4*  HCT 31.6* 32.6* 32.8* 34.2* 32.9*  MCV 87.5 89.3 90.4 89.3 89.6  PLT 341 340 337 383 161   Basic Metabolic Panel: Recent Labs  Lab 08/19/21 0606 08/20/21 0435 08/21/21 0455 08/22/21 0548 08/23/21 0549  NA 133* 135 136 136 137  K 3.5 4.4 4.0 3.8 4.0  CL 92* 91* 90* 89* 88*  CO2 34* 35* 36* 38* 38*  GLUCOSE 135* 195* 161* 99 90  BUN 31* 34* 34* 34* 46*  CREATININE 0.80 1.02* 0.93 1.00 1.11*  CALCIUM  8.7* 8.6* 8.4* 9.0 9.1   GFR: Estimated Creatinine Clearance: 45.8 mL/min (A) (by C-G formula based on SCr of 1.11 mg/dL (H)). Liver Function Tests: No results for input(s): "AST", "ALT", "ALKPHOS", "BILITOT", "PROT", "ALBUMIN" in the last 168 hours.  No results for input(s): "LIPASE", "AMYLASE" in the last 168 hours. No results for input(s): "AMMONIA" in the last 168 hours. Coagulation Profile: No results for input(s): "INR", "PROTIME" in the last 168 hours. Cardiac Enzymes: No results for input(s): "CKTOTAL", "CKMB", "CKMBINDEX", "TROPONINI" in the last 168 hours. BNP (last 3 results) No results for input(s): "PROBNP" in the last 8760 hours. HbA1C: No results for input(s): "HGBA1C" in the last 72 hours. CBG: Recent Labs  Lab 08/21/21 2041 08/22/21 0759 08/22/21 1137 08/22/21 1702 08/22/21 2104  GLUCAP 114* 106* 112* 359* 155*   Lipid Profile: No results for input(s): "CHOL", "HDL", "LDLCALC", "TRIG", "CHOLHDL", "LDLDIRECT" in the last 72 hours. Thyroid Function Tests: No results for input(s): "TSH", "T4TOTAL", "FREET4", "T3FREE", "THYROIDAB" in the last 72 hours. Anemia Panel: No results for input(s): "VITAMINB12", "FOLATE", "FERRITIN", "TIBC", "IRON", "RETICCTPCT" in the last 72 hours. Sepsis Labs: No results for input(s): "PROCALCITON", "LATICACIDVEN" in the last 168 hours.   Recent Results (from the past 240 hour(s))  Blood culture (routine x 2)     Status: None   Collection Time: 08/14/21  4:53 PM   Specimen: BLOOD  Result Value Ref Range Status   Specimen Description BLOOD [facility]  Final   Special Requests BOTTLES DRAWN AEROBIC AND ANAEROBIC BCLV  Final   Culture   Final    NO GROWTH 5 DAYS Performed at [facility], [address] [place], [place] [address]    Report Status 08/19/2021 FINAL  Final  Blood culture (routine x 2)     Status: None   Collection Time: 08/14/21  4:55 PM   Specimen: BLOOD  Result Value Ref Range Status   Specimen Description  BLOOD RIGHT ANTECUBITAL  Final   Special Requests   Final    BOTTLES DRAWN AEROBIC AND ANAEROBIC Blood Culture adequate volume   Culture   Final    NO GROWTH 5 DAYS Performed at [facility], [address] [place], [place] [address]    Report Status 08/19/2021 FINAL  Final  MRSA Next Gen by PCR, Nasal     Status: None   Collection Time: 08/15/21  5:50 PM   Specimen: Nasal Mucosa; Nasal Swab  Result Value Ref Range Status   MRSA by PCR Next Gen NOT DETECTED NOT DETECTED  Final    Comment: (NOTE) The GeneXpert MRSA Assay (FDA approved for NASAL specimens only), is one component of a comprehensive MRSA colonization surveillance program. It is not intended to diagnose MRSA infection nor to guide or monitor treatment for MRSA infections. Test performance is not FDA approved in patients less than 62 years old. Performed at [facility], [address] [place], [place] [address]   Body fluid culture w Gram Stain     Status: None   Collection Time: 08/16/21  4:20 PM   Specimen: PATH Cytology Pleural fluid  Result Value Ref Range Status   Specimen Description   Final    PLEURAL Performed at [facility], [address], [place], [place] [address]    Special Requests   Final    PLEURAL Performed at [facility], [address] [place], [place] [address]    Gram Stain   Final    CYTOSPIN SMEAR WBC PRESENT,BOTH PMN AND MONONUCLEAR NO ORGANISMS SEEN    Culture   Final    NO GROWTH 3 DAYS Performed at [facility], [place] [address], [place], [place] [address]    Report Status 08/20/2021 FINAL  Final  Body fluid culture w Gram Stain     Status: None (Preliminary result)   Collection Time: 08/22/21 12:20 PM   Specimen: PATH Cytology Pleural fluid  Result Value Ref Range Status   Specimen Description   Final    PLEURAL Performed at [facility], [address] [place], [place] [address]    Special Requests   Final     PLEURAL Performed at [facility], [address] [place], [place] [address]    Gram Stain   Final    FEW WBC PRESENT,BOTH PMN AND MONONUCLEAR NO ORGANISMS SEEN Performed at [facility], [place] [address], [place], [place] [address]    Culture PENDING  Incomplete   Report Status PENDING  Incomplete         Radiology Studies: ECHOCARDIOGRAM LIMITED  Result Date: 08/22/2021    ECHOCARDIOGRAM LIMITED REPORT   Patient Name:   Karen [name] Date of Exam: 08/22/2021 Medical Rec #:  [id number]        Height:       63.0 in Accession #:    [id number]       Weight:       206.3 lb Date of Birth:  1945-05-20       BSA:          1.960 m Patient Age:    11 years         BP:           131/60 mmHg Patient Gender: F                HR:           93 bpm. Exam Location:  [facility] Procedure: Limited Echo, Color Doppler and Cardiac Doppler Indications:     Pericardial Effusion I31.3  History:         Patient has prior history of Echocardiogram examinations, most                  recent 08/20/2021. COPD; Risk Factors:Hypertension and Diabetes.                  HF with preserved EF.  Sonographer:     Sherrie Sport Referring Phys:  [id number] Kate Sable Diagnosing Phys: Kathlyn Sacramento MD  Sonographer  Comments: No apical window and suboptimal parasternal window. IMPRESSIONS  1. Left ventricular ejection fraction, by estimation, is 50 to 55%. The left ventricle has low normal function. The left ventricle has no regional wall motion abnormalities. Left ventricular diastolic function could not be evaluated.  2. Right ventricular systolic function is normal. The right ventricular size is normal. There is moderately elevated pulmonary artery systolic pressure.  3. A small pericardial effusion is present. The pericardial effusion is circumferential. Moderate pleural effusion in the left lateral region.  4. The mitral valve is normal in structure. No evidence of mitral valve regurgitation. No evidence of mitral  stenosis.  5. The aortic valve is normal in structure. Aortic valve regurgitation is not visualized. No aortic stenosis is present.  6. The inferior vena cava is dilated in size with <50% respiratory variability, suggesting right atrial pressure of 15 mmHg. FINDINGS  Left Ventricle: Left ventricular ejection fraction, by estimation, is 50 to 55%. The left ventricle has low normal function. The left ventricle has no regional wall motion abnormalities. The left ventricular internal cavity size was normal in size. There is no left ventricular hypertrophy. Left ventricular diastolic function could not be evaluated. Right Ventricle: The right ventricular size is normal. No increase in right ventricular wall thickness. Right ventricular systolic function is normal. There is moderately elevated pulmonary artery systolic pressure. The tricuspid regurgitant velocity is 3.18 m/s, and with an assumed right atrial pressure of 15 mmHg, the estimated right ventricular systolic pressure is 31.4 mmHg. Left Atrium: Left atrial size was normal in size. Right Atrium: Right atrial size was normal in size. Pericardium: A small pericardial effusion is present. The pericardial effusion is circumferential. Mitral Valve: The mitral valve is normal in structure. No evidence of mitral valve stenosis. Tricuspid Valve: The tricuspid valve is normal in structure. Tricuspid valve regurgitation is trivial. No evidence of tricuspid stenosis. Aortic Valve: The aortic valve is normal in structure. Aortic valve regurgitation is not visualized. No aortic stenosis is present. Pulmonic Valve: The pulmonic valve was normal in structure. Pulmonic valve regurgitation is not visualized. No evidence of pulmonic stenosis. Aorta: The aortic root is normal in size and structure. Venous: The inferior vena cava is dilated in size with less than 50% respiratory variability, suggesting right atrial pressure of 15 mmHg. IAS/Shunts: No atrial level shunt detected by  color flow Doppler. Additional Comments: There is a moderate pleural effusion in the left lateral region. LEFT VENTRICLE PLAX 2D LVIDd:         3.50 cm LVIDs:         2.10 cm LV PW:         1.20 cm LV IVS:        0.90 cm  LEFT ATRIUM         Index LA diam:    3.60 cm 1.84 cm/m                        PULMONIC VALVE AORTA                 PV Vmax:        0.68 m/s Ao Root diam: 2.60 cm PV Vmean:       42.100 cm/s                       PV VTI:         0.119 m  PV Peak grad:   1.8 mmHg                       PV Mean grad:   1.0 mmHg                       RVOT Peak grad: 3 mmHg  TRICUSPID VALVE TR Peak grad:   40.4 mmHg TR Vmax:        318.00 cm/s  SHUNTS Pulmonic VTI: 0.135 m Kathlyn Sacramento MD Electronically signed by Kathlyn Sacramento MD Signature Date/Time: 08/22/2021/4:08:36 PM    Final    DG Chest Port 1 View  Result Date: 08/22/2021 CLINICAL DATA:  S/p LEFT thoracentesis EXAM: PORTABLE CHEST 1 VIEW COMPARISON:  IR ultrasound, earlier same day and 08/16/2021. Chest XR, earlier same day. FINDINGS: Cardiac silhouette is enlarged and unchanged. Aortic arch calcifications. Low lung volumes. Interval removal of LEFT pleural fluid with trace residual. Small volume RIGHT pleural effusion is unchanged. No pneumothorax. Vascular congestion with perihilar interstitial thickening, and linear opacity at the LEFT lung base. No focal consolidation. No interval osseous abnormality IMPRESSION: 1. Interval removal of LEFT pleural fluid with trace residual. No pneumothorax 2. Otherwise unchanged pulmonary findings, with cardiomegaly and mild pulmonary edema with small volume RIGHT pleural effusion. Electronically Signed   By: Michaelle Birks M.D.   On: 08/22/2021 13:36   US THORACENTESIS ASP PLEURAL SPACE W/IMG GUIDE  Result Date: 08/22/2021 INDICATION: Symptomatic LEFT sided pleural effusion EXAM: US THORACENTESIS ASP PLEURAL SPACE W/IMG GUIDE COMPARISON:  Chest XR, earlier same day. IR ultrasound and CT chest,  08/16/2021. MEDICATIONS: None. COMPLICATIONS: None immediate. TECHNIQUE: Informed written consent was obtained from the the patient and/or patient's representative after a discussion of the risks, benefits and alternatives to treatment. A timeout was performed prior to the initiation of the procedure. Initial ultrasound scanning demonstrates a small volume LEFT pleural effusion. The lower chest was prepped and draped in the usual sterile fashion. 1% lidocaine was used for local anesthesia. An ultrasound image was saved for documentation purposes. An 8 Fr Safe-T-Centesis catheter was introduced. The thoracentesis was performed. The catheter was removed and a dressing was applied. The patient tolerated the procedure well without immediate post procedural complication. An immediate follow-up upright chest radiograph was requested. FINDINGS: A total of approximately 850 mL of serous fluid was removed. IMPRESSION: Successful ultrasound-guided LEFT therapeutic thoracentesis yielding 850 mL of pleural fluid. Michaelle Birks, MD [facility] Electronically Signed   By: Michaelle Birks M.D.   On: 08/22/2021 13:18   DG Chest Port 1 View  Result Date: 08/22/2021 CLINICAL DATA:  Hypoxia. EXAM: PORTABLE CHEST 1 VIEW COMPARISON:  08/21/2021 FINDINGS: 0410 hours. The cardio pericardial silhouette is enlarged. Bibasilar collapse/consolidation is similar to prior with bilateral pleural effusions, left greater than right also not substantially changed. Mild vascular congestion noted bilaterally with some improvement in aeration of the upper lungs. The visualized bony structures of the thorax are unremarkable. Telemetry leads overlie the chest. IMPRESSION: Stable exam. Bibasilar collapse/consolidation with bilateral pleural effusions, left greater than right. Electronically Signed   By: Misty Stanley M.D.   On: 08/22/2021 05:31        Scheduled Meds:  allopurinol  100 mg  Oral Daily   apixaban  5 mg Oral BID   atorvastatin  10 mg Oral Daily   budesonide (PULMICORT) nebulizer solution  0.25 mg Nebulization BID   chlorhexidine  15 mL  Mouth Rinse BID   diltiazem  180 mg Oral Daily   empagliflozin  10 mg Oral Daily   ferrous sulfate  325 mg Oral Q breakfast   fluticasone  2 spray Each Nare Daily   insulin aspart  0-20 Units Subcutaneous TID WC   insulin aspart  10 Units Subcutaneous TID WC   insulin glargine-yfgn  25 Units Subcutaneous BID   ipratropium  2 spray Each Nare QID   lidocaine  1 patch Transdermal Q24H   mouth rinse  15 mL Mouth Rinse q12n4p   melatonin  5 mg Oral QHS   montelukast  10 mg Oral QHS   pantoprazole  40 mg Oral Daily   spironolactone  12.5 mg Oral Daily   torsemide  40 mg Oral BID   umeclidinium-vilanterol  1 puff Inhalation Daily   vitamin B-12  500 mcg Oral Daily   Continuous Infusions:     LOS: 9 days    Time spent: 33 mins     Wyvonnia Dusky, MD [facility] Pager [phone]  If 7PM-7AM, please contact night-coverage 08/23/2021, 7:37 AM

## 2021-08-23 NOTE — Progress Notes (Signed)
PT Cancellation Note  Patient Details Name: Karen [name] MRN: [id number] DOB: [birth year]   Cancelled Treatment:    Reason Eval/Treat Not Completed: Fatigue/lethargy limiting ability to participate;Other (comment).  Chart reviewed and attempted to see pt.  Upon entering, pt notes that she is too fatigued to be seen by PT as she just finished up OT session a few minutes prior to therapist arrival.  Will re-attempt to see pt at later date/time as medically appropriate.   Gwenlyn Saran, PT, DPT 08/23/21, 4:39 PM

## 2021-08-23 NOTE — Progress Notes (Signed)
Occupational Therapy Treatment Patient Details Name: Karen [name] MRN: [id number] DOB: 1945-03-09 Today's Date: 08/23/2021   History of present illness Karen [name] is a 75yoF who comes to [facility] on 08/14/21 from [facility] for [facility]. P tgiven SL nitro prior to arrival. Pt Dx with [facility] 2 days prior. PMH of CHF, COPD on 3L/min, sleep apnea, CKDII/III, HLD, DM, former tobacco use, PAF on eliquis, pulmonary HTN.   OT comments  Pt seen for OT tx this date. Pt completed bed mobility with MIN A. Fair static sitting balance but fatigues, requiring BUE forearm support intermittently on tray table in front of her during grooming tasks including combing hair and brushing her dentures. Pt instructed in incentive spirometer, pt made good effort to attempt to increase length of inhale but was only able to get about 1 full second inhale before exhaling. Pt on 40L O2 via HFNC, and able to maintain O2 sats >93% throughout. Endorsed mild SOB. Pt progressing towards goals, continues to benefit from skilled OT services. Continue to recommend SNF at this time.    Recommendations for follow up therapy are one component of a multi-disciplinary discharge planning process, led by the attending physician.  Recommendations may be updated based on patient status, additional functional criteria and insurance authorization.    Follow Up Recommendations  Skilled nursing-short term rehab (<3 hours/day)    Assistance Recommended at Discharge Frequent or constant Supervision/Assistance  Patient can return home with the following  A lot of help with bathing/dressing/bathroom;A little help with walking and/or transfers;Assistance with cooking/housework;Direct supervision/assist for medications management;Assist for transportation;Help with stairs or ramp for entrance   Equipment Recommendations       Recommendations for Other Services      Precautions / Restrictions Precautions Precautions: Fall Precaution Comments: watch  spO2 Restrictions Weight Bearing Restrictions: No       Mobility Bed Mobility Overal bed mobility: Needs Assistance Bed Mobility: Supine to Sit, Sit to Supine     Supine to sit: Min assist, HOB elevated Sit to supine: Min assist   General bed mobility comments: MIN A for trunk support sup>sit, MIN A for BLE mgt back to bed    Transfers Overall transfer level: Needs assistance Equipment used: None Transfers: Bed to chair/wheelchair/BSC            Lateral/Scoot Transfers: Min guard       Balance Overall balance assessment: Needs assistance Sitting-balance support: Feet supported, Single extremity supported, Bilateral upper extremity supported Sitting balance-Leahy Scale: Fair Sitting balance - Comments: forearm support on tray table during grooming tasks                                   ADL either performed or assessed with clinical judgement   ADL Overall ADL's : Needs assistance/impaired     Grooming: Sitting;Supervision/safety;Set up;Oral care;Brushing hair;Wash/dry hands                                      Extremity/Trunk Assessment              Vision       Perception     Praxis      Cognition Arousal/Alertness: Awake/alert Behavior During Therapy: WFL for tasks assessed/performed Overall Cognitive Status: Within Functional Limits for tasks assessed  Exercises Other Exercises Other Exercises: Pt instructed in incentive spirometer, pt made good effort to attempt to increase length of inhale but was only able to get about 1 full second inhale before exhaling.    Shoulder Instructions       General Comments      Pertinent Vitals/ Pain       Pain Assessment Pain Assessment: 0-10 Pain Score: 8  Pain Location: back, chronic Pain Descriptors / Indicators: Aching Pain Intervention(s): Limited activity within patient's tolerance, Monitored during  session, Repositioned  Home Living                                          Prior Functioning/Environment              Frequency  Min 2X/week        Progress Toward Goals  OT Goals(current goals can now be found in the care plan section)  Progress towards OT goals: Progressing toward goals  Acute Rehab OT Goals Patient Stated Goal: go to rehab OT Goal Formulation: With patient Time For Goal Achievement: 09/03/21 Potential to Achieve Goals: Good  Plan Discharge plan remains appropriate;Frequency remains appropriate    Co-evaluation                 AM-PAC OT "6 Clicks" Daily Activity     Outcome Measure   Help from another person eating meals?: None Help from another person taking care of personal grooming?: A Little Help from another person toileting, which includes using toliet, bedpan, or urinal?: A Little Help from another person bathing (including washing, rinsing, drying)?: A Lot Help from another person to put on and taking off regular upper body clothing?: A Little Help from another person to put on and taking off regular lower body clothing?: A Lot 6 Click Score: 17    End of Session Equipment Utilized During Treatment: Oxygen  OT Visit Diagnosis: Unsteadiness on feet (R26.81);Muscle weakness (generalized) (M62.81)   Activity Tolerance Patient tolerated treatment well   Patient Left in bed;with call bell/phone within reach;with bed alarm set   Nurse Communication Mobility status        Time: 2992-4268 OT Time Calculation (min): 18 min  Charges: OT General Charges $OT Visit: 1 Visit OT Treatments $Self Care/Home Management : 8-22 mins  Ardeth Perfect., MPH, MS, OTR/L ascom [phone] 08/23/21, 4:33 PM

## 2021-08-24 ENCOUNTER — Ambulatory Visit: Payer: Medicare Other | Admitting: Cardiovascular Disease

## 2021-08-24 DIAGNOSIS — Z7189 Other specified counseling: Secondary | ICD-10-CM | POA: Diagnosis not present

## 2021-08-24 DIAGNOSIS — I480 Paroxysmal atrial fibrillation: Secondary | ICD-10-CM | POA: Diagnosis not present

## 2021-08-24 DIAGNOSIS — J189 Pneumonia, unspecified organism: Secondary | ICD-10-CM | POA: Diagnosis not present

## 2021-08-24 DIAGNOSIS — Y95 Nosocomial condition: Secondary | ICD-10-CM | POA: Diagnosis not present

## 2021-08-24 DIAGNOSIS — I5033 Acute on chronic diastolic (congestive) heart failure: Secondary | ICD-10-CM | POA: Diagnosis not present

## 2021-08-24 LAB — GLUCOSE, CAPILLARY
Glucose-Capillary: 168 mg/dL — ABNORMAL HIGH (ref 70–99)
Glucose-Capillary: 351 mg/dL — ABNORMAL HIGH (ref 70–99)
Glucose-Capillary: 215 mg/dL — ABNORMAL HIGH (ref 70–99)
Glucose-Capillary: 91 mg/dL (ref 70–99)

## 2021-08-24 LAB — BASIC METABOLIC PANEL
Anion gap: 9 (ref 5–15)
BUN: 49 mg/dL — ABNORMAL HIGH (ref 8–23)
CO2: 41 mmol/L — ABNORMAL HIGH (ref 22–32)
Calcium: 8.8 mg/dL — ABNORMAL LOW (ref 8.9–10.3)
Chloride: 86 mmol/L — ABNORMAL LOW (ref 98–111)
Creatinine, Ser: 1.11 mg/dL — ABNORMAL HIGH (ref 0.44–1.00)
GFR, Estimated: 51 mL/min — ABNORMAL LOW (ref >60)
Glucose, Bld: 179 mg/dL — ABNORMAL HIGH (ref 70–99)
Potassium: 3.6 mmol/L (ref 3.5–5.1)
Sodium: 136 mmol/L (ref 135–145)

## 2021-08-24 LAB — CBC
HCT: 30.1 % — ABNORMAL LOW (ref 36.0–46.0)
Hemoglobin: 9.5 g/dL — ABNORMAL LOW (ref 12.0–15.0)
MCH: 28 pg (ref 26.0–34.0)
MCHC: 31.6 g/dL (ref 30.0–36.0)
MCV: 88.8 fL (ref 80.0–100.0)
Platelets: 318 10*3/uL (ref 150–400)
RBC: 3.39 MIL/uL — ABNORMAL LOW (ref 3.87–5.11)
RDW: 14.8 % (ref 11.5–15.5)
WBC: 13.7 10*3/uL — ABNORMAL HIGH (ref 4.0–10.5)
nRBC: 0 % (ref 0.0–0.2)

## 2021-08-24 NOTE — Inpatient Diabetes Management (Signed)
Inpatient Diabetes Program Recommendations  AACE/ADA: New Consensus Statement on Inpatient Glycemic Control   Target Ranges:  Prepandial:   less than 140 mg/dL      Peak postprandial:   less than 180 mg/dL (1-2 hours)      Critically ill patients:  140 - 180 mg/dL    Latest Reference Range & Units 08/23/21 08:25 08/23/21 12:47 08/23/21 16:07 08/23/21 21:03 08/24/21 07:32  Glucose-Capillary 70 - 99 mg/dL 116 (H)  Novolog 10 units  Semglee 25 units 248 (H)  Novolog 17 units 270 (H)  Novolog 21 units 241 (H)     Semglee 25 units 168 (H)  Novolog 14 units  Semglee 25 units   Review of Glycemic Control  Diabetes history: DM2 Outpatient Diabetes medications: Dulaglutide 3 mg Qweek, Jardiance 10 mg daily, Lantus 24 units BID, Novolin R 2-14 units TID with meals Current orders for Inpatient glycemic control: Semglee 25 units BID, Novolog 10 units TID with meals, Novolog 0-20 units TID with meals, Jardiance 10 mg daily  Inpatient Diabetes Program Recommendations:    Insulin: Please consider increasing meal coverage to Novolog 14 units TID with meals.  Thanks, Barnie Alderman, RN, MSN, [place] Diabetes Coordinator Inpatient Diabetes Program [phone] (Team Pager from 8am to [place])

## 2021-08-24 NOTE — Progress Notes (Signed)
PROGRESS NOTE    Karen [name]  YOV:[id number] DOB: 02/29/1946  DOA: 08/14/2021 Date of Service: 08/24/21 PCP: Margarita Rana, MD     Brief Narrative / Hospital Course:  Karen [name] is a 76 y.o. female with a past medical history of HFpEF, anemia, CKD 4, COPD, diabetes, hypertension, hyperlipidemia recently hospitalized and d/c'd 08/10/21 to rehab.  She presents to the emergency department 08/14/21 for left chest pain chest pain starting 2 days prior but worsening.  According to the patient she was diagnosed with pneumonia 2 to 3 days prior, since that time she had been experiencing pain in her chest as well as some mild shortness of breath.  Patient wears 3 L of oxygen chronically was satting 93% on 3 L. Lab work showed AKI, leukocytosis, hyponatremia, elevated lactate.  Code sepsis was activated.  Given IV fluids but stopped after findings showed features of CHF exacerbation.  She was started on broad-spectrum antibiotics.  She was also noted to be in A-fib with RVR on presentation and was started on Cardizem drip.  Cardiology consulted and following.  Hospital course remarkable for persistent requirement of high flow oxygen/BiPAP.  Palliative care consulted due to poor prognosis. 6/14-6/20/23: Pt unfortunately has not made much progress over the past week. Pt is still on HFNC and it has been difficult to wean the pt down. Pt is s/p L thoracentesis x 2, first on 08/16/21 and second on 08/22/21. Fluid studies were sent w/ the second thoracentesis. Pleural fluid cx NGTD. Pleural fluid cytology is pending. Pt initially refused a pleurx cath but today is agreeable if one is needed. Palliative care as well as myself have discussed comfort care with pt. Pt is only agreeable to DNR currently but wants to continue care   06/21: respiratory status improving minimally.    Consultants:  Palliative care  Cardiology  Procedures: none    Subjective: Patient reports feeling overall a bit better  today, breathing is a bit better, denies any significant pain.     ASSESSMENT & PLAN:   Principal Problem:   HAP (hospital-acquired pneumonia) Active Problems:   Acute on chronic heart failure with preserved ejection fraction (HFpEF) (HCC)   Hyponatremia   Sepsis due to pneumonia (HCC)   Chronic respiratory failure with hypoxia (HCC)   Hypokalemia   Uncontrolled type 2 diabetes mellitus with hyperglycemia, with long-term current use of insulin (HCC)   AF (paroxysmal atrial fibrillation) (HCC)   Acute kidney injury superimposed on CKD (HCC)   OSA (obstructive sleep apnea)   COPD (chronic obstructive pulmonary disease) (HCC)   Chest pain   Gastroesophageal reflux disease without esophagitis   Pericardial effusion   Permanent atrial fibrillation ([place])   Sepsis: secondary to hospital-acquired pneumonia. Was recently hospitalized here and was discharged to skilled nursing facility on 08/10/2021. Completed abx course. Resolved    Acute on chronic hypoxic respiratory failure: repeat CXR slight worsening of L sided pleural effusion. Uses 3L [place] at home but still on HFNC. Morphine prn. Has hx of CHF, COPD, OSA, pulmonary hypertension. BiPAP prn. Poor prognosis. High risk for cardiac and/or respiratory arrest   Acute on chronic diastolic CHF: echo done on 08/04/2021 showed normal EF, grade III diastolic dysfunction, TR. Continue on aldactone, torsemide as per cardio. Monitor I/Os   COPD exacerbation:  continue on bronchodilators & supplemental oxygen    PAF: continue on cardizem, eliquis    B/l pleural effusions: L>R. S/p L thoracentesis w/ 830mL of clear fluid removed on 08/16/21.  Continue on torsemide, aldactone. Repeat CXR shows slight worsening of left sided pleural effusion. S/p repeat L thoracentesis on 08/22/21. Pleural fluid NGTD. Pleural fluid cytology is pending. If left pleural effusion re-accumulates pt is now agreeable to pleurx catheter. Only small right pleural effusion, unlikely  enough to tap. Poor prognosis. High risk for cardiac and/or respiratory arrest    Pericardial effusion: w/o cardiac tamponade physiology as per cardio. Repeat limited echo shows small pericardial effusion. Management as per cardio    Chronic hyponatremia: likely contributed to by hypervolemic hyponatremia from CHF and also hyperglycemia.  WNL today again    DM2: poorly controlled, HbA1c 9.1. Continue on glargine, aspart, & SSI w/ accuchecks    Hypokalemia: WNL today    OSA: CPAP qhs     GERD: continue on PPI    Chest pain:  EKG showed sinus tachycardia.  Chest wall was tender on palpitation.  Most likely atypical chest pain.  Troponins are negative. Resolved    AKI on CKD stage IIIa: Cr is labile. Avoid nephrotoxic meds    Morbid obesity: BMI 36.1. Complicates overall care & prognosis      DVT prophylaxis: SCD, Eliquis Code Status: DNR Family Communication: husband at bedside  Disposition Plan / TOC needs: SNF Barriers to discharge / significant pending items: medical improvement             Objective: Vitals:   08/24/21 0441 08/24/21 0728 08/24/21 1238 08/24/21 1544  BP: (!) 101/48 110/83 (!) 106/50 (!) 112/54  Pulse: 60 62 69 68  Resp: (!) 25 (!) 22 20 18   Temp: 97.9 F (36.6 C) 97.9 F (36.6 C) 97.9 F (36.6 C) 97.6 F (36.4 C)  TempSrc: Oral Oral    SpO2: 98% 93% 93% 91%  Weight:      Height:        Intake/Output Summary (Last 24 hours) at 08/24/2021 1636 Last data filed at 08/24/2021 1538 Gross per 24 hour  Intake 900 ml  Output 3750 ml  Net -2850 ml   Filed Weights   08/22/21 0542 08/23/21 0457 08/24/21 0314  Weight: 93.6 kg 92.5 kg 93.5 kg    Examination:  Constitutional:  VS as above General Appearance: alert, well-developed, well-nourished, NAD Eyes: Normal lids and conjunctive, non-icteric scler Ears, Nose, Mouth, Throat: Normal appearance Neck: No masses, trachea midline Respiratory: Fair respiratory  effort CTABL Cardiovascular: S1/S2 normal, no murmur/rub/gallop auscultated No lower extremity edema Musculoskeletal:  No clubbing/cyanosis of digits Neurological: No cranial nerve deficit on limited exam Psychiatric: Normal judgment/insight Normal mood and affect       Scheduled Medications:   allopurinol  100 mg Oral Daily   apixaban  5 mg Oral BID   atorvastatin  10 mg Oral Daily   budesonide (PULMICORT) nebulizer solution  0.25 mg Nebulization BID   chlorhexidine  15 mL Mouth Rinse BID   diltiazem  180 mg Oral Daily   empagliflozin  10 mg Oral Daily   ferrous sulfate  325 mg Oral Q breakfast   fluticasone  2 spray Each Nare Daily   insulin aspart  0-20 Units Subcutaneous TID WC   insulin aspart  10 Units Subcutaneous TID WC   insulin glargine-yfgn  25 Units Subcutaneous BID   ipratropium  2 spray Each Nare QID   lidocaine  1 patch Transdermal Q24H   mouth rinse  15 mL Mouth Rinse q12n4p   melatonin  5 mg Oral QHS   montelukast  10 mg Oral QHS   pantoprazole  40 mg Oral Daily   spironolactone  12.5 mg Oral Daily   torsemide  40 mg Oral BID   umeclidinium-vilanterol  1 puff Inhalation Daily   vitamin B-12  500 mcg Oral Daily    Continuous Infusions:   PRN Medications:  acetaminophen, albuterol, ipratropium-albuterol, morphine injection, oxyCODONE, senna-docusate, traZODone  Antimicrobials:  Anti-infectives (From admission, onward)    Start     Dose/Rate Route Frequency Ordered Stop   08/15/21 1600  ceFEPIme (MAXIPIME) 1 g in sodium chloride 0.9 % 100 mL IVPB  Status:  Discontinued        1 g 200 mL/hr over 30 Minutes Intravenous Every 24 hours 08/14/21 1840 08/14/21 1853   08/15/21 1600  ceFEPIme (MAXIPIME) 2 g in sodium chloride 0.9 % 100 mL IVPB  Status:  Discontinued        2 g 200 mL/hr over 30 Minutes Intravenous Every 24 hours 08/14/21 1853 08/15/21 1308   08/15/21 1400  ceFEPIme (MAXIPIME) 2 g in sodium chloride 0.9 % 100 mL IVPB        2 g 200  mL/hr over 30 Minutes Intravenous Every 12 hours 08/15/21 1308 08/20/21 2054   08/15/21 1330  vancomycin (VANCOREADY) IVPB 750 mg/150 mL  Status:  Discontinued        750 mg 150 mL/hr over 60 Minutes Intravenous Every 24 hours 08/15/21 1231 08/16/21 1305   08/14/21 1645  vancomycin (VANCOCIN) IVPB 1000 mg/200 mL premix        1,000 mg 200 mL/hr over 60 Minutes Intravenous  Once 08/14/21 1642 08/14/21 1844   08/14/21 1645  ceFEPIme (MAXIPIME) 2 g in sodium chloride 0.9 % 100 mL IVPB        2 g 200 mL/hr over 30 Minutes Intravenous  Once 08/14/21 1642 08/14/21 1748       Data Reviewed: I have personally reviewed following labs and imaging studies  CBC: Recent Labs  Lab 08/20/21 0435 08/21/21 0455 08/22/21 0548 08/23/21 0549 08/24/21 0605  WBC 17.0* 17.3* 16.0* 17.9* 13.7*  HGB 10.3* 10.1* 10.5* 10.4* 9.5*  HCT 32.6* 32.8* 34.2* 32.9* 30.1*  MCV 89.3 90.4 89.3 89.6 88.8  PLT 340 337 383 337 073   Basic Metabolic Panel: Recent Labs  Lab 08/20/21 0435 08/21/21 0455 08/22/21 0548 08/23/21 0549 08/24/21 0605  NA 135 136 136 137 136  K 4.4 4.0 3.8 4.0 3.6  CL 91* 90* 89* 88* 86*  CO2 35* 36* 38* 38* 41*  GLUCOSE 195* 161* 99 90 179*  BUN 34* 34* 34* 46* 49*  CREATININE 1.02* 0.93 1.00 1.11* 1.11*  CALCIUM 8.6* 8.4* 9.0 9.1 8.8*   GFR: Estimated Creatinine Clearance: 46.1 mL/min (A) (by C-G formula based on SCr of 1.11 mg/dL (H)). Liver Function Tests: No results for input(s): "AST", "ALT", "ALKPHOS", "BILITOT", "PROT", "ALBUMIN" in the last 168 hours. No results for input(s): "LIPASE", "AMYLASE" in the last 168 hours. No results for input(s): "AMMONIA" in the last 168 hours. Coagulation Profile: No results for input(s): "INR", "PROTIME" in the last 168 hours. Cardiac Enzymes: No results for input(s): "CKTOTAL", "CKMB", "CKMBINDEX", "TROPONINI" in the last 168 hours. BNP (last 3 results) No results for input(s): "PROBNP" in the last 8760 hours. HbA1C: No results for  input(s): "HGBA1C" in the last 72 hours. CBG: Recent Labs  Lab 08/23/21 1607 08/23/21 2103 08/24/21 0732 08/24/21 1238 08/24/21 1544  GLUCAP 270* 241* 168* 351* 215*   Lipid Profile: No results for input(s): "CHOL", "HDL", "LDLCALC", "TRIG", "CHOLHDL", "LDLDIRECT"  in the last 72 hours. Thyroid Function Tests: No results for input(s): "TSH", "T4TOTAL", "FREET4", "T3FREE", "THYROIDAB" in the last 72 hours. Anemia Panel: No results for input(s): "VITAMINB12", "FOLATE", "FERRITIN", "TIBC", "IRON", "RETICCTPCT" in the last 72 hours. Urine analysis:    Component Value Date/Time   COLORURINE YELLOW (A) 08/03/2021 1549   APPEARANCEUR CLEAR (A) 08/03/2021 1549   APPEARANCEUR Cloudy (A) 09/28/2017 1338   LABSPEC 1.006 08/03/2021 1549   LABSPEC 1.024 04/02/2014 0517   PHURINE 6.0 08/03/2021 1549   GLUCOSEU NEGATIVE 08/03/2021 1549   GLUCOSEU >=500 04/02/2014 0517   HGBUR SMALL (A) 08/03/2021 1549   BILIRUBINUR NEGATIVE 08/03/2021 1549   BILIRUBINUR Negative 09/28/2017 1338   BILIRUBINUR Negative 04/02/2014 0517   KETONESUR NEGATIVE 08/03/2021 1549   PROTEINUR NEGATIVE 08/03/2021 1549   NITRITE POSITIVE (A) 08/03/2021 1549   LEUKOCYTESUR TRACE (A) 08/03/2021 1549   LEUKOCYTESUR Trace 04/02/2014 0517   Sepsis Labs: @LABRCNTIP (procalcitonin:4,lacticidven:4)  Recent Results (from the past 240 hour(s))  Blood culture (routine x 2)     Status: None   Collection Time: 08/14/21  4:53 PM   Specimen: BLOOD  Result Value Ref Range Status   Specimen Description BLOOD [facility]  Final   Special Requests BOTTLES DRAWN AEROBIC AND ANAEROBIC BCLV  Final   Culture   Final    NO GROWTH 5 DAYS Performed at [facility], [address], [place], [place] [address]    Report Status 08/19/2021 FINAL  Final  Blood culture (routine x 2)     Status: None   Collection Time: 08/14/21  4:55 PM   Specimen: BLOOD  Result Value Ref Range Status   Specimen Description BLOOD RIGHT ANTECUBITAL  Final    Special Requests   Final    BOTTLES DRAWN AEROBIC AND ANAEROBIC Blood Culture adequate volume   Culture   Final    NO GROWTH 5 DAYS Performed at [facility], [address] [place], [place] [address]    Report Status 08/19/2021 FINAL  Final  MRSA Next Gen by PCR, Nasal     Status: None   Collection Time: 08/15/21  5:50 PM   Specimen: Nasal Mucosa; Nasal Swab  Result Value Ref Range Status   MRSA by PCR Next Gen NOT DETECTED NOT DETECTED Final    Comment: (NOTE) The GeneXpert MRSA Assay (FDA approved for NASAL specimens only), is one component of a comprehensive MRSA colonization surveillance program. It is not intended to diagnose MRSA infection nor to guide or monitor treatment for MRSA infections. Test performance is not FDA approved in patients less than 75 years old. Performed at [facility], [address] [place], [place] [address]   Body fluid culture w Gram Stain     Status: None   Collection Time: 08/16/21  4:20 PM   Specimen: PATH Cytology Pleural fluid  Result Value Ref Range Status   Specimen Description   Final    PLEURAL Performed at [facility], [address], [place], [place] [address]    Special Requests   Final    PLEURAL Performed at [facility] [address] [place], [place] [address]    Gram Stain   Final    CYTOSPIN SMEAR WBC PRESENT,BOTH PMN AND MONONUCLEAR NO ORGANISMS SEEN    Culture   Final    NO GROWTH 3 DAYS Performed at [facility], [place] [address] [name],  [address]    Report Status 08/20/2021 FINAL  Final  Body fluid  culture w Gram Stain     Status: None (Preliminary result)   Collection Time: 08/22/21 12:20 PM   Specimen: PATH Cytology Pleural fluid  Result Value Ref Range Status   Specimen Description   Final    PLEURAL Performed at [facility], [address], [place], [place] [address]    Special Requests   Final    PLEURAL Performed at [facility], [address] [place], [place] [address]    Gram Stain   Final    FEW WBC PRESENT,BOTH PMN AND MONONUCLEAR NO ORGANISMS SEEN    Culture   Final    NO GROWTH 2 DAYS Performed at [facility], [place] [address] [place], [place] [address]    Report Status PENDING  Incomplete         Radiology Studies last 96 hours: ECHOCARDIOGRAM LIMITED  Result Date: 08/22/2021    ECHOCARDIOGRAM LIMITED REPORT   Patient Name:   KADA FRIESEN Date of Exam: 08/22/2021 Medical Rec #:  [id number]        Height:       63.0 in Accession #:    [id number]       Weight:       206.3 lb Date of Birth:  1945/06/16       BSA:          1.960 m Patient Age:    55 years         BP:           131/60 mmHg Patient Gender: F                HR:           93 bpm. Exam Location:  [facility] Procedure: Limited Echo, Color Doppler and Cardiac Doppler Indications:     Pericardial Effusion I31.3  History:         Patient has prior history of Echocardiogram examinations, most                  recent 08/20/2021. COPD; Risk Factors:Hypertension and Diabetes.                  HF with preserved EF.  Sonographer:     Sherrie Sport Referring Phys:  [id number] Kate Sable Diagnosing Phys: Kathlyn Sacramento MD  Sonographer Comments: No apical window and suboptimal parasternal window. IMPRESSIONS  1. Left ventricular ejection fraction, by estimation, is 50 to 55%. The left ventricle has low normal function. The left ventricle has no regional wall motion abnormalities. Left ventricular diastolic function could not be evaluated.  2. Right ventricular systolic function is normal. The right ventricular size is normal. There is moderately elevated pulmonary artery systolic pressure.  3. A small pericardial effusion is present. The pericardial effusion is circumferential. Moderate pleural effusion in the left lateral region.  4. The mitral valve is normal in structure. No evidence of mitral valve regurgitation. No evidence of mitral stenosis.   5. The aortic valve is normal in structure. Aortic valve regurgitation is not visualized. No aortic stenosis is present.  6. The inferior vena cava is dilated in size with <50% respiratory variability, suggesting right atrial pressure of 15 mmHg. FINDINGS  Left Ventricle: Left ventricular ejection fraction, by estimation, is 50 to 55%. The left ventricle has low normal function. The left ventricle has no regional wall motion abnormalities. The left ventricular internal cavity size was normal in size. There is no left ventricular hypertrophy. Left ventricular diastolic  function could not be evaluated. Right Ventricle: The right ventricular size is normal. No increase in right ventricular wall thickness. Right ventricular systolic function is normal. There is moderately elevated pulmonary artery systolic pressure. The tricuspid regurgitant velocity is 3.18 m/s, and with an assumed right atrial pressure of 15 mmHg, the estimated right ventricular systolic pressure is 40.9 mmHg. Left Atrium: Left atrial size was normal in size. Right Atrium: Right atrial size was normal in size. Pericardium: A small pericardial effusion is present. The pericardial effusion is circumferential. Mitral Valve: The mitral valve is normal in structure. No evidence of mitral valve stenosis. Tricuspid Valve: The tricuspid valve is normal in structure. Tricuspid valve regurgitation is trivial. No evidence of tricuspid stenosis. Aortic Valve: The aortic valve is normal in structure. Aortic valve regurgitation is not visualized. No aortic stenosis is present. Pulmonic Valve: The pulmonic valve was normal in structure. Pulmonic valve regurgitation is not visualized. No evidence of pulmonic stenosis. Aorta: The aortic root is normal in size and structure. Venous: The inferior vena cava is dilated in size with less than 50% respiratory variability, suggesting right atrial pressure of 15 mmHg. IAS/Shunts: No atrial level shunt detected by color flow  Doppler. Additional Comments: There is a moderate pleural effusion in the left lateral region. LEFT VENTRICLE PLAX 2D LVIDd:         3.50 cm LVIDs:         2.10 cm LV PW:         1.20 cm LV IVS:        0.90 cm  LEFT ATRIUM         Index LA diam:    3.60 cm 1.84 cm/m                        PULMONIC VALVE AORTA                 PV Vmax:        0.68 m/s Ao Root diam: 2.60 cm PV Vmean:       42.100 cm/s                       PV VTI:         0.119 m                       PV Peak grad:   1.8 mmHg                       PV Mean grad:   1.0 mmHg                       RVOT Peak grad: 3 mmHg  TRICUSPID VALVE TR Peak grad:   40.4 mmHg TR Vmax:        318.00 cm/s  SHUNTS Pulmonic VTI: 0.135 m Kathlyn Sacramento MD Electronically signed by Kathlyn Sacramento MD Signature Date/Time: 08/22/2021/4:08:36 PM    Final    DG Chest Port 1 View  Result Date: 08/22/2021 CLINICAL DATA:  S/p LEFT thoracentesis EXAM: PORTABLE CHEST 1 VIEW COMPARISON:  IR ultrasound, earlier same day and 08/16/2021. Chest XR, earlier same day. FINDINGS: Cardiac silhouette is enlarged and unchanged. Aortic arch calcifications. Low lung volumes. Interval removal of LEFT pleural fluid with trace residual. Small volume RIGHT pleural effusion is unchanged. No pneumothorax. Vascular congestion with perihilar interstitial thickening, and linear opacity at the  LEFT lung base. No focal consolidation. No interval osseous abnormality IMPRESSION: 1. Interval removal of LEFT pleural fluid with trace residual. No pneumothorax 2. Otherwise unchanged pulmonary findings, with cardiomegaly and mild pulmonary edema with small volume RIGHT pleural effusion. Electronically Signed   By: Michaelle Birks M.D.   On: 08/22/2021 13:36   US THORACENTESIS ASP PLEURAL SPACE W/IMG GUIDE  Result Date: 08/22/2021 INDICATION: Symptomatic LEFT sided pleural effusion EXAM: US THORACENTESIS ASP PLEURAL SPACE W/IMG GUIDE COMPARISON:  Chest XR, earlier same day. IR ultrasound and CT chest, 08/16/2021.  MEDICATIONS: None. COMPLICATIONS: None immediate. TECHNIQUE: Informed written consent was obtained from the the patient and/or patient's representative after a discussion of the risks, benefits and alternatives to treatment. A timeout was performed prior to the initiation of the procedure. Initial ultrasound scanning demonstrates a small volume LEFT pleural effusion. The lower chest was prepped and draped in the usual sterile fashion. 1% lidocaine was used for local anesthesia. An ultrasound image was saved for documentation purposes. An 8 Fr Safe-T-Centesis catheter was introduced. The thoracentesis was performed. The catheter was removed and a dressing was applied. The patient tolerated the procedure well without immediate post procedural complication. An immediate follow-up upright chest radiograph was requested. FINDINGS: A total of approximately 850 mL of serous fluid was removed. IMPRESSION: Successful ultrasound-guided LEFT therapeutic thoracentesis yielding 850 mL of pleural fluid. Michaelle Birks, MD [facility] Electronically Signed   By: Michaelle Birks M.D.   On: 08/22/2021 13:18   DG Chest Port 1 View  Result Date: 08/22/2021 CLINICAL DATA:  Hypoxia. EXAM: PORTABLE CHEST 1 VIEW COMPARISON:  08/21/2021 FINDINGS: 0410 hours. The cardio pericardial silhouette is enlarged. Bibasilar collapse/consolidation is similar to prior with bilateral pleural effusions, left greater than right also not substantially changed. Mild vascular congestion noted bilaterally with some improvement in aeration of the upper lungs. The visualized bony structures of the thorax are unremarkable. Telemetry leads overlie the chest. IMPRESSION: Stable exam. Bibasilar collapse/consolidation with bilateral pleural effusions, left greater than right. Electronically Signed   By: Misty Stanley M.D.   On: 08/22/2021 05:31   DG Chest Port 1 View  Result Date: 08/21/2021 CLINICAL  DATA:  Dyspnea. EXAM: PORTABLE CHEST 1 VIEW COMPARISON:  08/16/2021 FINDINGS: Lordotic technique is demonstrated. Lungs are adequately inflated demonstrate hazy opacification over the mid to lower lungs bilaterally likely bilateral effusions, left worse than right with associated basilar atelectasis. The left effusion demonstrate slight interval worsening. Infection or edema over the mid lungs is also possible. Stable cardiomegaly. Remainder of the exam is unchanged. IMPRESSION: 1. Hazy opacification over the mid to lower lungs bilaterally likely effusions with associated basilar atelectasis. Slight interval worsening over the left effusion. Infection or edema over the mid lungs is also possible. 2. Stable cardiomegaly. Electronically Signed   By: Marin Olp M.D.   On: 08/21/2021 08:42            LOS: 10 days      Emeterio Reeve, DO [facility] 08/24/2021, 4:36 PM   Staff may message me via secure chat in [place]  but this may not receive immediate response,  please page for urgent matters!  If 7PM-7AM, please contact night-coverage [email or web address]  Dictation software was used to generate the above note. Typos may occur and escape review, as with typed/written notes. Please contact Dr Sheppard Coil directly for clarity if needed.

## 2021-08-24 NOTE — Progress Notes (Signed)
Progress Note  Patient Name: Karen [name] Date of Encounter: 08/24/2021  [facility] Cardiologist: Ida Rogue, MD   Subjective   Patient seen on AM rounds. Denies any chest pain or worsening shortness of breath. States she continues to feel lousy. Husband remains at the bedside. Continued on heated high flow O2 therapy. - 2.5 L output in the last 24 hours.  Inpatient Medications    Scheduled Meds:  allopurinol  100 mg Oral Daily   apixaban  5 mg Oral BID   atorvastatin  10 mg Oral Daily   budesonide (PULMICORT) nebulizer solution  0.25 mg Nebulization BID   chlorhexidine  15 mL Mouth Rinse BID   diltiazem  180 mg Oral Daily   empagliflozin  10 mg Oral Daily   ferrous sulfate  325 mg Oral Q breakfast   fluticasone  2 spray Each Nare Daily   insulin aspart  0-20 Units Subcutaneous TID WC   insulin aspart  10 Units Subcutaneous TID WC   insulin glargine-yfgn  25 Units Subcutaneous BID   ipratropium  2 spray Each Nare QID   lidocaine  1 patch Transdermal Q24H   mouth rinse  15 mL Mouth Rinse q12n4p   melatonin  5 mg Oral QHS   montelukast  10 mg Oral QHS   pantoprazole  40 mg Oral Daily   spironolactone  12.5 mg Oral Daily   torsemide  40 mg Oral BID   umeclidinium-vilanterol  1 puff Inhalation Daily   vitamin B-12  500 mcg Oral Daily   Continuous Infusions:  PRN Meds: acetaminophen, albuterol, ipratropium-albuterol, morphine injection, oxyCODONE, senna-docusate, traZODone   Vital Signs    Vitals:   08/24/21 0005 08/24/21 0314 08/24/21 0441 08/24/21 0728  BP: (!) 106/50  (!) 101/48 110/83  Pulse: (!) 56  60 62  Resp: 20  (!) 25 (!) 22  Temp: 97.6 F (36.4 C)  97.9 F (36.6 C) 97.9 F (36.6 C)  TempSrc: Oral  Oral Oral  SpO2: 96%  98% 93%  Weight:  93.5 kg    Height:        Intake/Output Summary (Last 24 hours) at 08/24/2021 1150 Last data filed at 08/24/2021 1054 Gross per 24 hour  Intake 780 ml  Output 3550 ml  Net -2770 ml      08/24/2021     3:14 AM 08/23/2021    4:57 AM 08/22/2021    5:42 AM  Last 3 Weights  Weight (lbs) 206 lb 3.2 oz 203 lb 14.4 oz 206 lb 5.6 oz  Weight (kg) 93.532 kg 92.488 kg 93.6 kg      Telemetry    Atrial fibrillation rate 50 to 60s with aberrancy- Personally Reviewed  ECG    No new tracings- Personally Reviewed  Physical Exam   GEN: No acute distress.  Visibly weak and fatigued Neck: JVD difficult to assess due to body habitus Cardiac: Irregularly irregular, no murmurs, rubs, or gallops.  Respiratory: Diminished to auscultation bilaterally.  Respirations are unlabored at rest but she does continue to remain on heated high flow GI: Soft, nontender, non-distended, obese with bowel sounds present in all 4 quadrants MS: 1+ edema edema; No deformity. Neuro:  Nonfocal  Psych: Normal affect   Labs    High Sensitivity Troponin:   Recent Labs  Lab 08/03/21 1324 08/03/21 1549 08/14/21 1518 08/14/21 1653  TROPONINIHS 11 10 16 12      Chemistry Recent Labs  Lab 08/22/21 0548 08/23/21 0549 08/24/21 0605  NA  136 137 136  K 3.8 4.0 3.6  CL 89* 88* 86*  CO2 38* 38* 41*  GLUCOSE 99 90 179*  BUN 34* 46* 49*  CREATININE 1.00 1.11* 1.11*  CALCIUM 9.0 9.1 8.8*  GFRNONAA 58* 51* 51*  ANIONGAP 9 11 9     Lipids No results for input(s): "CHOL", "TRIG", "HDL", "LABVLDL", "LDLCALC", "CHOLHDL" in the last 168 hours.  Hematology Recent Labs  Lab 08/22/21 0548 08/23/21 0549 08/24/21 0605  WBC 16.0* 17.9* 13.7*  RBC 3.83* 3.67* 3.39*  HGB 10.5* 10.4* 9.5*  HCT 34.2* 32.9* 30.1*  MCV 89.3 89.6 88.8  MCH 27.4 28.3 28.0  MCHC 30.7 31.6 31.6  RDW 14.7 14.8 14.8  PLT 383 337 318   Thyroid No results for input(s): "TSH", "FREET4" in the last 168 hours.  BNPNo results for input(s): "BNP", "PROBNP" in the last 168 hours.  DDimer No results for input(s): "DDIMER" in the last 168 hours.   Radiology    DG Chest Port 1 View  Result Date: 08/22/2021 CLINICAL DATA:  S/p LEFT thoracentesis EXAM:  PORTABLE CHEST 1 VIEW COMPARISON:  IR ultrasound, earlier same day and 08/16/2021. Chest XR, earlier same day. FINDINGS: Cardiac silhouette is enlarged and unchanged. Aortic arch calcifications. Low lung volumes. Interval removal of LEFT pleural fluid with trace residual. Small volume RIGHT pleural effusion is unchanged. No pneumothorax. Vascular congestion with perihilar interstitial thickening, and linear opacity at the LEFT lung base. No focal consolidation. No interval osseous abnormality IMPRESSION: 1. Interval removal of LEFT pleural fluid with trace residual. No pneumothorax 2. Otherwise unchanged pulmonary findings, with cardiomegaly and mild pulmonary edema with small volume RIGHT pleural effusion. Electronically Signed   By: Michaelle Birks M.D.   On: 08/22/2021 13:36   US THORACENTESIS ASP PLEURAL SPACE W/IMG GUIDE  Result Date: 08/22/2021 INDICATION: Symptomatic LEFT sided pleural effusion EXAM: US THORACENTESIS ASP PLEURAL SPACE W/IMG GUIDE COMPARISON:  Chest XR, earlier same day. IR ultrasound and CT chest, 08/16/2021. MEDICATIONS: None. COMPLICATIONS: None immediate. TECHNIQUE: Informed written consent was obtained from the the patient and/or patient's representative after a discussion of the risks, benefits and alternatives to treatment. A timeout was performed prior to the initiation of the procedure. Initial ultrasound scanning demonstrates a small volume LEFT pleural effusion. The lower chest was prepped and draped in the usual sterile fashion. 1% lidocaine was used for local anesthesia. An ultrasound image was saved for documentation purposes. An 8 Fr Safe-T-Centesis catheter was introduced. The thoracentesis was performed. The catheter was removed and a dressing was applied. The patient tolerated the procedure well without immediate post procedural complication. An immediate follow-up upright chest radiograph was requested. FINDINGS: A total of approximately 850 mL of serous fluid was removed.  IMPRESSION: Successful ultrasound-guided LEFT therapeutic thoracentesis yielding 850 mL of pleural fluid. Michaelle Birks, MD [facility] Radiology Electronically Signed   By: Michaelle Birks M.D.   On: 08/22/2021 13:18    Cardiac Studies  Echocardiogram completed 08/22/2021 1. Left ventricular ejection fraction, by estimation, is 50 to 55%. The  left ventricle has low normal function. The left ventricle has no regional  wall motion abnormalities. Left ventricular diastolic function could not  be evaluated.   2. Right ventricular systolic function is normal. The right ventricular  size is normal. There is moderately elevated pulmonary artery systolic  pressure.   3. A small pericardial effusion is present. The pericardial effusion is  circumferential. Moderate pleural effusion in the left lateral region.  4. The mitral valve is normal in structure. No evidence of mitral valve  regurgitation. No evidence of mitral stenosis.   5. The aortic valve is normal in structure. Aortic valve regurgitation is  not visualized. No aortic stenosis is present.   6. The inferior vena cava is dilated in size with <50% respiratory  variability, suggesting right atrial pressure of 15 mmHg.   Limited echo completed 08/20/2021 1. Left ventricular ejection fraction, by estimation, is 55 to 60%. The  left ventricle has normal function.   2. Right ventricular systolic function is low normal.   3. A small pericardial effusion is present. Large pleural effusion in the  left lateral region.   4. The aortic valve is calcified.   5. The inferior vena cava is dilated in size with <50% respiratory  variability, suggesting right atrial pressure of 15 mmHg.   Echocardiogram completed 08/16/2021 1. Left ventricular ejection fraction, by estimation, is 55 to 60%. The  left ventricle has normal function. The left ventricle has no regional  wall motion abnormalities. There is  moderate left ventricular hypertrophy.   2. Right ventricular systolic function is moderately reduced. The right  ventricular size is normal. Moderately increased right ventricular wall  thickness. There is severely elevated pulmonary artery systolic pressure.   3. Moderate pericardial effusion. The pericardial effusion is  circumferential, though the largest fluid collections are inferolateral to  the LV and adjacent to the RA. There is no definite evidence of tamponade  physiology, though evaluation is limited  without mitral/tricuspid valve Doppler waveforms to assess for respiratory  variation.   4. Mild mitral valve regurgitation.   5. Tricuspid valve regurgitation is moderate.   6. The aortic valve has an indeterminant number of cusps. There is  moderate calcification of the aortic valve. There is moderate thickening  of the aortic valve.   7. The inferior vena cava is dilated in size with <50% respiratory  variability, suggesting right atrial pressure of 15 mmHg.   Echocardiogram completed 08/04/2021  1. Left ventricular ejection fraction, by estimation, is 55 to 60%. The  left ventricle has normal function. The left ventricle has no regional  wall motion abnormalities. Left ventricular diastolic parameters are  consistent with Grade III diastolic  dysfunction (restrictive). Elevated left atrial pressure. There is the  interventricular septum is flattened in systole, consistent with right  ventricular pressure overload.   2. Right ventricular systolic function is mildly reduced. The right  ventricular size is moderately enlarged. There is moderately elevated  pulmonary artery systolic pressure.   3. Left atrial size was mildly dilated.   4. The mitral valve is degenerative. Mild mitral valve regurgitation. No  evidence of mitral stenosis.   5. Tricuspid valve regurgitation is moderate.   6. The aortic valve has an indeterminant number of cusps. There is mild  calcification of  the aortic valve. There is moderate thickening of the  aortic valve. Aortic valve regurgitation is not visualized. Mild to  moderate aortic valve stenosis. Aortic  valve area, by VTI measures 1.20 cm. Aortic valve mean gradient measures  10.3 mmHg.   7. The inferior vena cava is dilated in size with <50% respiratory  variability, suggesting right atrial pressure of 15 mmHg.   Patient Profile     76 y.o. female with a history of HFpEF, COPD, obesity, respiratory failure, presenting with shortness of breath and edema, diagnosed with pneumonia and respiratory failure being seen for A-fib RVR,HFpEF, and pericardial effusion.  Assessment &  Plan    HFpEF with bilateral pleural effusions -Status postthoracentesis on 08/22/2021 with 850 mL of serous fluid removed, cytology continues to be pending, at the time patient refused Pleurx catheter but is now in agreements to placement --2.5 L in the last 24 hours -Continue torsemide 40 mg twice daily -Daily BMP while on diuretics -Continue Jardiance -Continue spironolactone -Daily weights, strict I&O, low-sodium diet  2.  Pericardial effusion -Repeat echocardiogram completed on 08/22/2021 revealed EF 50 to 55% with small pericardial effusion without tamponade -Need to monitor for reoccurrence since back on Eliquis  3.  Permanent atrial fibrillation -Currently remains rate controlled -Continue diltiazem 180 mg daily -Continue Eliquis 5 mg twice daily  4.  Acute on chronic hypoxic respiratory failure -Multifactorial including heart failure preserved EF versus bilateral pleural effusions and pericardial effusion, hospital-acquired pneumonia, and COPD exacerbation -Remains on heated high flow -Titrate FiO2 to maintain sats greater than equal to 90% -Work on pulmonary toileting      For questions or updates, please contact [facility] Please consult [email or web address] for contact info under        Signed, Vennessa Affinito, NP  08/24/2021, 11:50  AM

## 2021-08-24 NOTE — Progress Notes (Signed)
Daily Progress Note   Patient Name: Karen [name]       Date: 08/24/2021 DOB: February 11, 1946  Age: 76 y.o. MRN#: [id number] Attending Physician: Emeterio Reeve, DO Primary Care Physician: Margarita Rana, MD Admit Date: 08/14/2021  Reason for Consultation/Follow-up: Establishing goals of care  Subjective: Notes and labs reviewed. In to see patient. She nods and says hello. She is on high flow cannula, but has weaned a bit since last visit. She appears tired and frail. Her husband is at bedside. He immediately begins discussing updates. He states if fluid reaccumulates they will be amenable to pleurex drain placement; he quickly tells me he will be able to manage this without issue.  She agrees with this. Attempted to discuss care and risks with this and he quickly states he is aware.  He tells me she is improving and doing fine. He tells me she has been eating and drinking very well. He discusses plans for when she comes home. Discussed the need for weaning from heated high flow cannula, and being able to work safely with PT/OT. He quickly tells me he is aware of this as well. Patient confirms knowledge of this, and has no questions at this time.   Length of Stay: 10  Current Medications: Scheduled Meds:   allopurinol  100 mg Oral Daily   apixaban  5 mg Oral BID   atorvastatin  10 mg Oral Daily   budesonide (PULMICORT) nebulizer solution  0.25 mg Nebulization BID   chlorhexidine  15 mL Mouth Rinse BID   diltiazem  180 mg Oral Daily   empagliflozin  10 mg Oral Daily   ferrous sulfate  325 mg Oral Q breakfast   fluticasone  2 spray Each Nare Daily   insulin aspart  0-20 Units Subcutaneous TID WC   insulin aspart  10 Units Subcutaneous TID WC   insulin glargine-yfgn  25 Units Subcutaneous  BID   ipratropium  2 spray Each Nare QID   lidocaine  1 patch Transdermal Q24H   mouth rinse  15 mL Mouth Rinse q12n4p   melatonin  5 mg Oral QHS   montelukast  10 mg Oral QHS   pantoprazole  40 mg Oral Daily   spironolactone  12.5 mg Oral Daily   torsemide  40 mg Oral BID   umeclidinium-vilanterol  1 puff Inhalation Daily   vitamin B-12  500 mcg Oral Daily    Continuous Infusions:   PRN Meds: acetaminophen, albuterol, ipratropium-albuterol, morphine injection, oxyCODONE, senna-docusate, traZODone  Physical Exam Pulmonary:     Comments: On heated high flow.  Neurological:     Mental Status: She is alert.             Vital Signs: BP 110/83 (BP Location: Left Arm)   Pulse 62   Temp 97.9 F (36.6 C) (Oral)   Resp (!) 22   Ht 5\' 3"  (1.6 m)   Wt 93.5 kg   SpO2 93%   BMI 36.53 kg/m  SpO2: SpO2: 93 % O2 Device: O2 Device: High Flow Nasal Cannula O2 Flow Rate: O2 Flow Rate (L/min): 40 L/min  Intake/output summary:  Intake/Output Summary (Last 24 hours) at 08/24/2021 1100 Last data filed at 08/24/2021 1054 Gross per 24 hour  Intake 780 ml  Output 3550 ml  Net -2770 ml   LBM: Last BM Date : 08/23/21 Baseline Weight: Weight: 93.5 kg Most recent weight: Weight: 93.5 kg       Palliative Assessment/Data: 40%      Patient Active Problem List   Diagnosis Date Noted   Pericardial effusion    Permanent atrial fibrillation (HCC)    Sepsis due to pneumonia ([place]) 08/14/2021   HAP (hospital-acquired pneumonia) 08/14/2021   Severe pulmonary hypertension ([place]) 05/29/2021   OSA (obstructive sleep apnea) 05/29/2021   Elevated troponin 05/28/2021   Lower abdominal pain 05/28/2021   Nausea and vomiting 05/28/2021   Prolonged QT interval 05/28/2021   At risk for fall due to comorbid condition 05/02/2021   Pleural effusion    Acute kidney injury superimposed on CKD (HCC)    Impaired gait and mobility 12/20/2020   Pulmonary nodules/lesions, multiple 10/29/2020   Anemia in  chronic kidney disease 10/29/2020   Aortic stenosis, moderate    AF (paroxysmal atrial fibrillation) (HCC)    Gastroesophageal reflux disease without esophagitis    Chronic respiratory failure with hypercapnia ([place]) 05/21/2020   Severe sepsis ([place]) 05/21/2020   UTI (urinary tract infection) 05/21/2020   Hyperglycemia due to type 2 diabetes mellitus ([place]) 05/21/2020   Abnormal CT of liver 05/21/2020   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 05/21/2020   Morbid obesity ([place]) 05/08/2020   Athscl heart disease 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 03/06/2020   Acute hip pain, left 10/23/2019   Lumbar stenosis with neurogenic claudication 10/23/2019   COPD with acute exacerbation ([place]) 74/02/8785   Acute diastolic CHF (congestive heart failure) ([place]) 09/28/2019   (HFpEF) heart failure with preserved ejection fraction ([place]) 09/27/2019   Hypokalemia 06/28/2019   Hyponatremia 06/28/2019   Uncontrolled type 2 diabetes mellitus with hyperglycemia, with long-term current use of insulin ([place]) 06/28/2019   CKD (chronic kidney disease), stage IIIa 06/28/2019   Atrial fibrillation, chronic ([place]) 06/28/2019   Pulmonary edema 02/27/2019   Bradycardia 02/24/2019   Acute on chronic diastolic CHF (congestive heart failure) ([place]) 02/23/2019   Chronic respiratory failure with hypoxia ([place]) 02/23/2019   Chest pain 02/13/2019   Osteopenia of neck of left femur 11/20/2018   AVM (arteriovenous malformation) of small bowel, acquired    Acute gastric ulcer with hemorrhage    Chronic diastolic heart failure (HCC) 08/22/2018   Diarrhea 08/22/2018   Junctional bradycardia    Acute on chronic heart failure with preserved ejection fraction (HFpEF) (HCC)    AKI (acute kidney injury) ([place])  Symptomatic bradycardia 08/05/2018   Acute on chronic respiratory failure ([place]) 06/25/2018   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 diabetes mellitus ([place]) 01/25/2018   History of non anemic vitamin B12  deficiency 01/25/2018   Personal history of kidney stones 11/12/2017   Urge incontinence 11/12/2017   History of leukocytosis 09/17/2017   GI bleed 08/26/2017   Arthritis 08/10/2017   Stage 4 chronic kidney disease ([place]) 08/10/2017   COPD (chronic obstructive pulmonary disease) ([place]) 08/10/2017   Diabetes mellitus type 2, uncomplicated ([place]) 45/62/5638   Hypertension 08/10/2017   Obesity (BMI 35.0-39.9 without comorbidity) 04/11/2017   Primary osteoarthritis of right knee 09/01/2016   Leucocytosis 10/19/2015   Asterixis 01/07/2015   Acute on chronic respiratory failure with hypoxia and hypercapnia (HCC) 01/07/2015   Sciatica 01/07/2015   Weakness 01/07/2015   Chronic 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 01/04/2015   Iron deficiency anemia 06/22/2014   Microalbuminuria 06/22/2014   CHF (congestive heart failure) ([place]) 04/20/2014   Edema, peripheral 04/20/2014    Palliative Care Assessment & Plan    Recommendations/Plan: Continue current care. She confirms being amenable to pleurex if needed.   Recommend outpatient palliative to follow.   Code Status:    Code Status Orders  (From admission, onward)           Start     Ordered   08/14/21 1844  Do not attempt resuscitation (DNR)  Continuous       Question Answer Comment  In the event of cardiac or respiratory ARREST Do not call a "code blue"   In the event of cardiac or respiratory ARREST Do not perform Intubation, CPR, defibrillation or ACLS   In the event of cardiac or respiratory ARREST Use medication by any route, position, wound care, and other measures to relive pain and suffering. May use oxygen, suction and manual treatment of airway obstruction as needed for comfort.      08/14/21 1849           Code Status History     Date Active Date Inactive Code Status Order ID Comments User Context   08/03/2021 1714 08/10/2021 2302 DNR [id number]  Leslee Home, DO ED   05/28/2021 1927 06/02/2021 2335 DNR  [id number]  Norval [place], MD ED   05/28/2021 1851 05/28/2021 1927 Full Code [id number]  Norval [place], MD ED   02/03/2021 1805 02/12/2021 1902 DNR [id number]  Mercy Riding, MD Inpatient   02/01/2021 1653 02/03/2021 1805 Full Code [id number]  Collier Bullock, MD ED   10/29/2020 1650 11/09/2020 1747 Full Code [id number]  Cox, [place], DO ED   06/17/2020 2215 06/21/2020 2140 Full Code [id number]  Mansy, Arvella Merles, MD ED   05/21/2020 2251 05/25/2020 1950 Full Code [id number]  Athena Masse, MD Inpatient   09/28/2019 0120 10/02/2019 2116 DNR [id number]  Sueanne Margarita, [place] ED   06/28/2019 1333 06/30/2019 1619 DNR [id number]  Ivor Costa, MD Inpatient   06/28/2019 1255 06/28/2019 1333 DNR [id number]  Ivor Costa, MD Inpatient   02/23/2019 1938 02/27/2019 1810 Full Code [id number]  Edwin Dada, MD Inpatient   02/13/2019 2011 02/16/2019 1819 DNR [id number]  Nicolette Bang, DO ED   09/14/2018 2220 09/17/2018 2350 DNR [id number]  Henreitta Leber, MD Inpatient   08/05/2018 1547 08/10/2018 1949 Full Code [id number]  Gladstone Lighter, MD Inpatient   06/25/2018 1417 06/30/2018 1557 Full Code [id number]  Epifanio Lesches, MD ED   09/15/2017 1747 09/18/2017 2106 DNR [id number]  Sudini,  Srikar, MD ED   08/26/2017 2216 08/30/2017 1604 Full Code [id number]  Nicholes Mango, MD Inpatient   01/08/2015 0307 01/09/2015 1336 Full Code [id number]  Theodoro Grist, MD Inpatient      Advance Directive Documentation    Flowsheet Row Most Recent Value  Type of Advance Directive Out of facility DNR (pink MOST or yellow form)  Pre-existing out of facility DNR order (yellow form or pink MOST form) --  "MOST" Form in Place? --       Prognosis:  Poor overall    Care plan was discussed with RN  Thank you for allowing the Palliative Medicine Team to assist in the care of this patient.     Asencion Gowda, NP  Please contact Palliative Medicine Team phone at [phone] for questions and concerns.

## 2021-08-24 NOTE — Progress Notes (Signed)
Physical Therapy Treatment Patient Details Name: Karen [name] MRN: [id number] DOB: 10-Dec-1945 Today's Date: 08/24/2021   History of Present Illness Karen [name] is a 67yoF who comes to [facility] on 08/14/21 from [facility] for [facility]. P tgiven SL nitro prior to arrival. Pt Dx with [facility] 2 days prior. PMH of CHF, COPD on 3L/min, sleep apnea, CKDII/III, HLD, DM, former tobacco use, PAF on eliquis, pulmonary HTN.    PT Comments    Pt was pleasant but needed some encouragement to participate but agreeable to therapy and put forth good effort throughout. Pt was educated on the benefit of mobility and repositioning to help alleviate pain and agreeable, at first, to minimal activity but was able to do more as session progressed. Pt was able to complete bed mobility w/ minA for trunk control to sit EOB and minA +2 for sit to supine for trunk control and leg management; extra time and effort from pt to complete. Pt was able to complete sit to stand w/ minA to initiate and for steadying. Pt was able to side step for [facility] using RW w/ minA  for steadying and verbal cuing for sequencing. SpO2 monitored throughout session dropping in low 80s and cued for breathing but able to rise 96 when in standing on HF 40L. Pt will benefit from PT services in a SNF setting upon discharge to safely address deficits listed in patient problem list for decreased caregiver assistance and eventual return to PLOF.    Recommendations for follow up therapy are one component of a multi-disciplinary discharge planning process, led by the attending physician.  Recommendations may be updated based on patient status, additional functional criteria and insurance authorization.  Follow Up Recommendations  Skilled nursing-short term rehab (<3 hours/day) Can patient physically be transported by private vehicle: No   Assistance Recommended at Discharge Frequent or constant Supervision/Assistance  Patient can return home with the following  Assistance with cooking/housework;Assist for transportation;Help with stairs or ramp for entrance;Direct supervision/assist for medications management;A little help with walking and/or transfers;A little help with bathing/dressing/bathroom   Equipment Recommendations  None recommended by PT    Recommendations for Other Services       Precautions / Restrictions Precautions Precautions: Fall Precaution Comments: watch spO2 Restrictions Weight Bearing Restrictions: No     Mobility  Bed Mobility Overal bed mobility: Needs Assistance Bed Mobility: Supine to Sit, Sit to Supine     Supine to sit: Min assist, HOB elevated Sit to supine: Min assist, +2 for physical assistance   General bed mobility comments: minA for trunk control and leg management    Transfers Overall transfer level: Needs assistance Equipment used: Rolling walker (2 wheels) Transfers: Sit to/from Stand Sit to Stand: Min assist           General transfer comment: minA for steadying and to initiate stand    Ambulation/Gait Ambulation/Gait assistance: Min assist Gait Distance (Feet): 2 Feet Assistive device: Rolling walker (2 wheels) Gait Pattern/deviations: Step-to pattern, Trunk flexed, Narrow base of support, Decreased step length - right, Decreased step length - left       General Gait Details: pt able to side step to [facility] w/ minA for steadying and verbal cuing for sequencing   Stairs             Wheelchair Mobility    Modified Rankin (Stroke Patients Only)       Balance Overall balance assessment: Needs assistance Sitting-balance support: Bilateral upper extremity supported, Feet supported Sitting balance-Leahy Scale:  Fair     Standing balance support: Reliant on assistive device for balance, During functional activity Standing balance-Leahy Scale: Poor                              Cognition Arousal/Alertness: Awake/alert Behavior During Therapy: WFL for tasks  assessed/performed Overall Cognitive Status: Within Functional Limits for tasks assessed                                          Exercises General Exercises - Lower Extremity Ankle Circles/Pumps: Strengthening, Both, 10 reps Quad Sets: Strengthening, Both, 10 reps Long Arc Quad: Strengthening, Both, 10 reps Heel Slides: Strengthening, Both, 10 reps    General Comments        Pertinent Vitals/Pain Pain Assessment Pain Assessment: 0-10 Pain Score: 9  Pain Location: back Pain Descriptors / Indicators: Aching Pain Intervention(s): Monitored during session, Repositioned, Premedicated before session    Home Living                          Prior Function            PT Goals (current goals can now be found in the care plan section) Progress towards PT goals: Progressing toward goals    Frequency    Min 2X/week      PT Plan Current plan remains appropriate    Co-evaluation              AM-PAC PT "6 Clicks" Mobility   Outcome Measure  Help needed turning from your back to your side while in a flat bed without using bedrails?: A Little Help needed moving from lying on your back to sitting on the side of a flat bed without using bedrails?: A Little Help needed moving to and from a bed to a chair (including a wheelchair)?: A Little Help needed standing up from a chair using your arms (e.g., wheelchair or bedside chair)?: A Little Help needed to walk in hospital room?: A Lot Help needed climbing 3-5 steps with a railing? : A Lot 6 Click Score: 16    End of Session Equipment Utilized During Treatment: Gait belt Activity Tolerance: Patient tolerated treatment well Patient left: in bed;with call bell/phone within reach;with bed alarm set Nurse Communication: Mobility status PT Visit Diagnosis: Other abnormalities of gait and mobility (R26.89);Difficulty in walking, not elsewhere classified (R26.2);Muscle weakness (generalized) (M62.81)      Time: 2707-8675 PT Time Calculation (min) (ACUTE ONLY): 31 min  Charges:                        Turner Daniels, SPT  08/24/2021, 5:19 PM

## 2021-08-25 ENCOUNTER — Inpatient Hospital Stay: Payer: Medicare Other

## 2021-08-25 ENCOUNTER — Inpatient Hospital Stay (HOSPITAL_COMMUNITY)
Admit: 2021-08-25 | Discharge: 2021-08-25 | Disposition: A | Discharge status: Home / Self Care | Payer: Medicare Other | Attending: Medical | Admitting: Medical

## 2021-08-25 ENCOUNTER — Other Ambulatory Visit: Payer: Self-pay | Admitting: Nurse Practitioner

## 2021-08-25 DIAGNOSIS — I509 Heart failure, unspecified: Secondary | ICD-10-CM

## 2021-08-25 DIAGNOSIS — R079 Chest pain, unspecified: Secondary | ICD-10-CM | POA: Diagnosis not present

## 2021-08-25 DIAGNOSIS — J189 Pneumonia, unspecified organism: Secondary | ICD-10-CM | POA: Diagnosis not present

## 2021-08-25 DIAGNOSIS — J9611 Chronic respiratory failure with hypoxia: Secondary | ICD-10-CM | POA: Diagnosis not present

## 2021-08-25 DIAGNOSIS — I3139 Other pericardial effusion (noninflammatory): Secondary | ICD-10-CM | POA: Diagnosis not present

## 2021-08-25 DIAGNOSIS — K219 Gastro-esophageal reflux disease without esophagitis: Secondary | ICD-10-CM

## 2021-08-25 DIAGNOSIS — I480 Paroxysmal atrial fibrillation: Secondary | ICD-10-CM | POA: Diagnosis not present

## 2021-08-25 DIAGNOSIS — Y95 Nosocomial condition: Secondary | ICD-10-CM | POA: Diagnosis not present

## 2021-08-25 LAB — GLUCOSE, CAPILLARY
Glucose-Capillary: 67 mg/dL — ABNORMAL LOW (ref 70–99)
Glucose-Capillary: 102 mg/dL — ABNORMAL HIGH (ref 70–99)
Glucose-Capillary: 333 mg/dL — ABNORMAL HIGH (ref 70–99)
Glucose-Capillary: 203 mg/dL — ABNORMAL HIGH (ref 70–99)
Glucose-Capillary: 132 mg/dL — ABNORMAL HIGH (ref 70–99)

## 2021-08-25 LAB — BASIC METABOLIC PANEL
Anion gap: 10 (ref 5–15)
BUN: 46 mg/dL — ABNORMAL HIGH (ref 8–23)
CO2: 39 mmol/L — ABNORMAL HIGH (ref 22–32)
Calcium: 8.9 mg/dL (ref 8.9–10.3)
Chloride: 86 mmol/L — ABNORMAL LOW (ref 98–111)
Creatinine, Ser: 1.01 mg/dL — ABNORMAL HIGH (ref 0.44–1.00)
GFR, Estimated: 57 mL/min — ABNORMAL LOW (ref >60)
Glucose, Bld: 63 mg/dL — ABNORMAL LOW (ref 70–99)
Potassium: 3.7 mmol/L (ref 3.5–5.1)
Sodium: 135 mmol/L (ref 135–145)

## 2021-08-25 LAB — ECHOCARDIOGRAM LIMITED
Height: 63 in
S' Lateral: 2.85 cm
Weight: 3294.55 oz

## 2021-08-25 LAB — BODY FLUID CULTURE W GRAM STAIN: Culture: NO GROWTH

## 2021-08-25 LAB — CBC
HCT: 32.5 % — ABNORMAL LOW (ref 36.0–46.0)
Hemoglobin: 10.2 g/dL — ABNORMAL LOW (ref 12.0–15.0)
MCH: 27.9 pg (ref 26.0–34.0)
MCHC: 31.4 g/dL (ref 30.0–36.0)
MCV: 89 fL (ref 80.0–100.0)
Platelets: 338 10*3/uL (ref 150–400)
RBC: 3.65 MIL/uL — ABNORMAL LOW (ref 3.87–5.11)
RDW: 14.6 % (ref 11.5–15.5)
WBC: 15.6 10*3/uL — ABNORMAL HIGH (ref 4.0–10.5)
nRBC: 0 % (ref 0.0–0.2)

## 2021-08-25 MED ORDER — ORAL CARE MOUTH RINSE: 15.0000 mL | OROMUCOSAL | Status: DC | PRN

## 2021-08-25 MED ORDER — INSULIN GLARGINE-YFGN 100 UNIT/ML ~~LOC~~ SOLN
22.0000 [IU] | Freq: Two times a day (BID) | SUBCUTANEOUS | Status: DC
Start: 2021-08-26 — End: 2021-09-07
  Administered 2021-08-26 – 2021-09-07 (×25): 22 [IU] via SUBCUTANEOUS
  Filled 2021-08-25 (×26): qty 0.22

## 2021-08-25 MED ORDER — TORSEMIDE 20 MG PO TABS
60.0000 mg | ORAL_TABLET | Freq: Two times a day (BID) | ORAL | Status: DC
Start: 2021-08-26 — End: 2021-08-31
  Administered 2021-08-26 – 2021-08-30 (×10): 60 mg via ORAL
  Filled 2021-08-25 (×10): qty 3

## 2021-08-25 NOTE — Progress Notes (Signed)
Called by RN to remove pt from [facility]. Pt states she no longer wants to wear. Placed pt back on [facility].

## 2021-08-25 NOTE — Progress Notes (Signed)
PROGRESS NOTE    Karen [name]  IZT:[id number] DOB: Jul 24, 1945  DOA: 08/14/2021 Date of Service: 08/25/21 PCP: Karen Rana, MD     Brief Narrative / Hospital Course:  Karen [name] is a 76 y.o. female with a past medical history of HFpEF, anemia, CKD 4, COPD, diabetes, hypertension, hyperlipidemia recently hospitalized and d/c'd 08/10/21 to rehab.  She presents to the emergency department 08/14/21 for left chest pain chest pain starting 2 days prior but worsening.  According to the patient she was diagnosed with pneumonia 2 to 3 days prior, since that time she had been experiencing pain in her chest as well as some mild shortness of breath.  Patient wears 3 L of oxygen chronically was satting 93% on 3 L. Lab work showed AKI, leukocytosis, hyponatremia, elevated lactate.  Code sepsis was activated.  Given IV fluids but stopped after findings showed features of CHF exacerbation.  She was started on broad-spectrum antibiotics.  She was also noted to be in A-fib with RVR on presentation and was started on Cardizem drip.  Cardiology consulted and following.  Hospital course remarkable for persistent requirement of high flow oxygen/BiPAP.  Palliative care consulted due to poor prognosis. 6/14-6/20/23: Pt unfortunately has not made much progress over the past week. Pt is still on HFNC and it has been difficult to wean the pt down. Pt is s/p L thoracentesis x 2, first on 08/16/21 and second on 08/22/21. Fluid studies were sent w/ the second thoracentesis. Pleural fluid cx NGTD. Pleural fluid cytology is pending. Pt initially refused a pleurx cath but today is agreeable if one is needed. Palliative care as well as myself have discussed comfort care with pt. Pt is only agreeable to DNR currently but wants to continue care   06/21: respiratory status improving only minimally but patient feels subjectively improved  06/22: Patient reports increasing SOB but not dramatically worse. CXR pending.     Consultants:  Palliative care  Cardiology  Procedures: none    Subjective: Pt reports increased SOB today. NO other complaints/concerns.      ASSESSMENT & PLAN:   Principal Problem:   HAP (hospital-acquired pneumonia) Active Problems:   Acute on chronic heart failure with preserved ejection fraction (HFpEF) (HCC)   Hyponatremia   Sepsis due to pneumonia (HCC)   Chronic respiratory failure with hypoxia (HCC)   Hypokalemia   Uncontrolled type 2 diabetes mellitus with hyperglycemia, with long-term current use of insulin (HCC)   AF (paroxysmal atrial fibrillation) (HCC)   Acute kidney injury superimposed on CKD (HCC)   OSA (obstructive sleep apnea)   COPD (chronic obstructive pulmonary disease) (HCC)   Chest pain   Gastroesophageal reflux disease without esophagitis   Pericardial effusion   Permanent atrial fibrillation (HCC)  Acute on chronic hypoxic respiratory failure: repeat CXR slight worsening of L sided pleural effusion. Uses 3L [place] at home but still on HFNC. Morphine prn. Has hx of CHF, COPD, OSA, pulmonary hypertension. BiPAP prn. Poor prognosis. High risk for cardiac and/or respiratory arrest  B/l pleural effusions: L>R. S/p L thoracentesis w/ 857mL of clear fluid removed on 08/16/21. Continue on torsemide, aldactone. Repeat CXR shows slight worsening of left sided pleural effusion. S/p repeat L thoracentesis on 08/22/21. Pleural fluid NGTD. Pleural fluid cytology is pending. If left pleural effusion re-accumulates pt is now agreeable to pleurx catheter. Only small right pleural effusion, unlikely enough to tap. Repeat CXR today stable no new infiltrate but there is edema, will increase Torsemide dose. Poor  prognosis. High risk for cardiac and/or respiratory arrest   Pericardial effusion: w/o cardiac tamponade physiology as per cardio. Repeat limited echo shows small pericardial effusion. Management as per cardio   Sepsis: secondary to hospital-acquired pneumonia.  Was recently hospitalized here and was discharged to skilled nursing facility on 08/10/2021. Completed abx course. Resolved     Acute on chronic diastolic CHF: Resolved. echo done on 08/04/2021 showed normal EF, grade III diastolic dysfunction, TR. Continue on aldactone, torsemide as per cardio. Monitor I/Os   COPD exacerbation: improved.  continue on bronchodilators & supplemental oxygen    PAF: stable: continue on cardizem, eliquis     Chronic hyponatremia: likely contributed to by hypervolemic hyponatremia from CHF and also hyperglycemia.  WNL today again    DM2: poorly controlled, HbA1c 9.1. Continue on glargine, aspart, & SSI w/ accuchecks    Hypokalemia: WNL today    OSA: CPAP qhs     GERD: continue on PPI    Chest pain: resolved.  EKG showed sinus tachycardia.  Chest wall was tender on palpitation.  Most likely atypical chest pain.  Troponins are negative.    AKI on CKD stage IIIa: Cr is labile. Avoid nephrotoxic meds    Morbid obesity: BMI 36.1. Complicates overall care & prognosis      DVT prophylaxis: SCD, Eliquis Code Status: DNR Family Communication: husband at bedside  Disposition Plan / TOC needs: SNF Barriers to discharge / significant pending items: medical improvement             Objective: Vitals:   08/24/21 2033 08/25/21 0008 08/25/21 0427 08/25/21 0500  BP: (!) 108/57  114/60   Pulse: 68 68 64   Resp: 18 (!) 21 20   Temp: 97.8 F (36.6 C)  97.9 F (36.6 C)   TempSrc: Oral  Oral   SpO2: 95% 96% 95%   Weight:    93.4 kg  Height:        Intake/Output Summary (Last 24 hours) at 08/25/2021 0747 Last data filed at 08/25/2021 0543 Gross per 24 hour  Intake 900 ml  Output 2350 ml  Net -1450 ml    Filed Weights   08/23/21 0457 08/24/21 0314 08/25/21 0500  Weight: 92.5 kg 93.5 kg 93.4 kg    Examination:  Constitutional:  VS as above General Appearance: alert, well-developed, well-nourished, NAD Eyes: Normal lids and conjunctive,  non-icteric scler Ears, Nose, Mouth, Throat: Normal appearance Neck: No masses, trachea midline Respiratory: Fair respiratory effort CTABL Cardiovascular: S1/S2 normal, no murmur/rub/gallop auscultated No lower extremity edema Musculoskeletal:  No clubbing/cyanosis of digits Neurological: No cranial nerve deficit on limited exam Psychiatric: Normal judgment/insight Normal mood and affect       Scheduled Medications:   allopurinol  100 mg Oral Daily   apixaban  5 mg Oral BID   atorvastatin  10 mg Oral Daily   budesonide (PULMICORT) nebulizer solution  0.25 mg Nebulization BID   diltiazem  180 mg Oral Daily   empagliflozin  10 mg Oral Daily   ferrous sulfate  325 mg Oral Q breakfast   fluticasone  2 spray Each Nare Daily   insulin aspart  0-20 Units Subcutaneous TID WC   insulin aspart  10 Units Subcutaneous TID WC   insulin glargine-yfgn  25 Units Subcutaneous BID   ipratropium  2 spray Each Nare QID   lidocaine  1 patch Transdermal Q24H   melatonin  5 mg Oral QHS   montelukast  10 mg Oral QHS   pantoprazole  40 mg Oral Daily   spironolactone  12.5 mg Oral Daily   torsemide  40 mg Oral BID   umeclidinium-vilanterol  1 puff Inhalation Daily   vitamin B-12  500 mcg Oral Daily    Continuous Infusions:   PRN Medications:  acetaminophen, albuterol, ipratropium-albuterol, morphine injection, mouth rinse, oxyCODONE, senna-docusate, traZODone  Antimicrobials:  Anti-infectives (From admission, onward)    Start     Dose/Rate Route Frequency Ordered Stop   08/15/21 1600  ceFEPIme (MAXIPIME) 1 g in sodium chloride 0.9 % 100 mL IVPB  Status:  Discontinued        1 g 200 mL/hr over 30 Minutes Intravenous Every 24 hours 08/14/21 1840 08/14/21 1853   08/15/21 1600  ceFEPIme (MAXIPIME) 2 g in sodium chloride 0.9 % 100 mL IVPB  Status:  Discontinued        2 g 200 mL/hr over 30 Minutes Intravenous Every 24 hours 08/14/21 1853 08/15/21 1308   08/15/21 1400  ceFEPIme  (MAXIPIME) 2 g in sodium chloride 0.9 % 100 mL IVPB        2 g 200 mL/hr over 30 Minutes Intravenous Every 12 hours 08/15/21 1308 08/20/21 2054   08/15/21 1330  vancomycin (VANCOREADY) IVPB 750 mg/150 mL  Status:  Discontinued        750 mg 150 mL/hr over 60 Minutes Intravenous Every 24 hours 08/15/21 1231 08/16/21 1305   08/14/21 1645  vancomycin (VANCOCIN) IVPB 1000 mg/200 mL premix        1,000 mg 200 mL/hr over 60 Minutes Intravenous  Once 08/14/21 1642 08/14/21 1844   08/14/21 1645  ceFEPIme (MAXIPIME) 2 g in sodium chloride 0.9 % 100 mL IVPB        2 g 200 mL/hr over 30 Minutes Intravenous  Once 08/14/21 1642 08/14/21 1748       Data Reviewed: I have personally reviewed following labs and imaging studies  CBC: Recent Labs  Lab 08/21/21 0455 08/22/21 0548 08/23/21 0549 08/24/21 0605 08/25/21 0544  WBC 17.3* 16.0* 17.9* 13.7* 15.6*  HGB 10.1* 10.5* 10.4* 9.5* 10.2*  HCT 32.8* 34.2* 32.9* 30.1* 32.5*  MCV 90.4 89.3 89.6 88.8 89.0  PLT 337 383 337 318 578    Basic Metabolic Panel: Recent Labs  Lab 08/21/21 0455 08/22/21 0548 08/23/21 0549 08/24/21 0605 08/25/21 0544  NA 136 136 137 136 135  K 4.0 3.8 4.0 3.6 3.7  CL 90* 89* 88* 86* 86*  CO2 36* 38* 38* 41* 39*  GLUCOSE 161* 99 90 179* 63*  BUN 34* 34* 46* 49* 46*  CREATININE 0.93 1.00 1.11* 1.11* 1.01*  CALCIUM 8.4* 9.0 9.1 8.8* 8.9    GFR: Estimated Creatinine Clearance: 50.7 mL/min (A) (by C-G formula based on SCr of 1.01 mg/dL (H)). Liver Function Tests: No results for input(s): "AST", "ALT", "ALKPHOS", "BILITOT", "PROT", "ALBUMIN" in the last 168 hours. No results for input(s): "LIPASE", "AMYLASE" in the last 168 hours. No results for input(s): "AMMONIA" in the last 168 hours. Coagulation Profile: No results for input(s): "INR", "PROTIME" in the last 168 hours. Cardiac Enzymes: No results for input(s): "CKTOTAL", "CKMB", "CKMBINDEX", "TROPONINI" in the last 168 hours. BNP (last 3 results) No results  for input(s): "PROBNP" in the last 8760 hours. HbA1C: No results for input(s): "HGBA1C" in the last 72 hours. CBG: Recent Labs  Lab 08/24/21 0732 08/24/21 1238 08/24/21 1544 08/24/21 2054 08/25/21 0724  GLUCAP 168* 351* 215* 91 67*    Lipid Profile: No results for input(s): "CHOL", "  HDL", "LDLCALC", "TRIG", "CHOLHDL", "LDLDIRECT" in the last 72 hours. Thyroid Function Tests: No results for input(s): "TSH", "T4TOTAL", "FREET4", "T3FREE", "THYROIDAB" in the last 72 hours. Anemia Panel: No results for input(s): "VITAMINB12", "FOLATE", "FERRITIN", "TIBC", "IRON", "RETICCTPCT" in the last 72 hours. Urine analysis:    Component Value Date/Time   COLORURINE YELLOW (A) 08/03/2021 1549   APPEARANCEUR CLEAR (A) 08/03/2021 1549   APPEARANCEUR Cloudy (A) 09/28/2017 1338   LABSPEC 1.006 08/03/2021 1549   LABSPEC 1.024 04/02/2014 0517   PHURINE 6.0 08/03/2021 1549   GLUCOSEU NEGATIVE 08/03/2021 1549   GLUCOSEU >=500 04/02/2014 0517   HGBUR SMALL (A) 08/03/2021 1549   BILIRUBINUR NEGATIVE 08/03/2021 1549   BILIRUBINUR Negative 09/28/2017 1338   BILIRUBINUR Negative 04/02/2014 0517   KETONESUR NEGATIVE 08/03/2021 1549   PROTEINUR NEGATIVE 08/03/2021 1549   NITRITE POSITIVE (A) 08/03/2021 1549   LEUKOCYTESUR TRACE (A) 08/03/2021 1549   LEUKOCYTESUR Trace 04/02/2014 0517   Sepsis Labs: @LABRCNTIP (procalcitonin:4,lacticidven:4)  Recent Results (from the past 240 hour(s))  MRSA Next Gen by PCR, Nasal     Status: None   Collection Time: 08/15/21  5:50 PM   Specimen: Nasal Mucosa; Nasal Swab  Result Value Ref Range Status   MRSA by PCR Next Gen NOT DETECTED NOT DETECTED Final    Comment: (NOTE) The GeneXpert MRSA Assay (FDA approved for NASAL specimens only), is one component of a comprehensive MRSA colonization surveillance program. It is not intended to diagnose MRSA infection nor to guide or monitor treatment for MRSA infections. Test performance is not FDA approved in patients  less than 11 years old. Performed at [facility], [address] [place], [place] [address]   Body fluid culture w Gram Stain     Status: None   Collection Time: 08/16/21  4:20 PM   Specimen: PATH Cytology Pleural fluid  Result Value Ref Range Status   Specimen Description   Final    PLEURAL Performed at [facility], [address], [place], [place] [address]    Special Requests   Final    PLEURAL Performed at [facility], [address] [place], [place] [address]    Gram Stain   Final    CYTOSPIN SMEAR WBC PRESENT,BOTH PMN AND MONONUCLEAR NO ORGANISMS SEEN    Culture   Final    NO GROWTH 3 DAYS Performed at [facility], [place] [address], [place], [place] [address]    Report Status 08/20/2021 FINAL  Final  Body fluid culture w Gram Stain     Status: None (Preliminary result)   Collection Time: 08/22/21 12:20 PM   Specimen: PATH Cytology Pleural fluid  Result Value Ref Range Status   Specimen Description   Final    PLEURAL Performed at [facility], [address] [place], [place] [address]    Special Requests   Final    PLEURAL Performed at [facility], [address] [place], [place] [address]    Gram Stain   Final    FEW WBC PRESENT,BOTH PMN AND MONONUCLEAR NO ORGANISMS SEEN    Culture   Final    NO GROWTH 3 DAYS Performed at [facility], [place] [address] [place], [place] [address]    Report Status PENDING  Incomplete         Radiology Studies last 96 hours: ECHOCARDIOGRAM LIMITED  Result Date: 08/22/2021    ECHOCARDIOGRAM LIMITED REPORT   Patient Name:   MINDIE RAWDON Date of Exam:  08/22/2021 Medical Rec #:  [id number]        Height:       63.0 in Accession #:    [id number]       Weight:       206.3 lb Date of Birth:  1945/12/04       BSA:          1.960 m Patient Age:    1 years         BP:           131/60 mmHg Patient Gender: F                HR:           93 bpm. Exam Location:  [facility]  Procedure: Limited Echo, Color Doppler and Cardiac Doppler Indications:     Pericardial Effusion I31.3  History:         Patient has prior history of Echocardiogram examinations, most                  recent 08/20/2021. COPD; Risk Factors:Hypertension and Diabetes.                  HF with preserved EF.  Sonographer:     Sherrie Sport Referring Phys:  [id number] Kate Sable Diagnosing Phys: Kathlyn Sacramento MD  Sonographer Comments: No apical window and suboptimal parasternal window. IMPRESSIONS  1. Left ventricular ejection fraction, by estimation, is 50 to 55%. The left ventricle has low normal function. The left ventricle has no regional wall motion abnormalities. Left ventricular diastolic function could not be evaluated.  2. Right ventricular systolic function is normal. The right ventricular size is normal. There is moderately elevated pulmonary artery systolic pressure.  3. A small pericardial effusion is present. The pericardial effusion is circumferential. Moderate pleural effusion in the left lateral region.  4. The mitral valve is normal in structure. No evidence of mitral valve regurgitation. No evidence of mitral stenosis.  5. The aortic valve is normal in structure. Aortic valve regurgitation is not visualized. No aortic stenosis is present.  6. The inferior vena cava is dilated in size with <50% respiratory variability, suggesting right atrial pressure of 15 mmHg. FINDINGS  Left Ventricle: Left ventricular ejection fraction, by estimation, is 50 to 55%. The left ventricle has low normal function. The left ventricle has no regional wall motion abnormalities. The left ventricular internal cavity size was normal in size. There is no left ventricular hypertrophy. Left ventricular diastolic function could not be evaluated. Right Ventricle: The right ventricular size is normal. No increase in right ventricular wall thickness. Right ventricular systolic function is normal. There is moderately elevated pulmonary  artery systolic pressure. The tricuspid regurgitant velocity is 3.18 m/s, and with an assumed right atrial pressure of 15 mmHg, the estimated right ventricular systolic pressure is 59.1 mmHg. Left Atrium: Left atrial size was normal in size. Right Atrium: Right atrial size was normal in size. Pericardium: A small pericardial effusion is present. The pericardial effusion is circumferential. Mitral Valve: The mitral valve is normal in structure. No evidence of mitral valve stenosis. Tricuspid Valve: The tricuspid valve is normal in structure. Tricuspid valve regurgitation is trivial. No evidence of tricuspid stenosis. Aortic Valve: The aortic valve is normal in structure. Aortic valve regurgitation is not visualized. No aortic stenosis is present. Pulmonic Valve: The pulmonic valve was normal in structure. Pulmonic valve regurgitation is not visualized. No evidence of pulmonic stenosis. Aorta: The aortic root is normal in  size and structure. Venous: The inferior vena cava is dilated in size with less than 50% respiratory variability, suggesting right atrial pressure of 15 mmHg. IAS/Shunts: No atrial level shunt detected by color flow Doppler. Additional Comments: There is a moderate pleural effusion in the left lateral region. LEFT VENTRICLE PLAX 2D LVIDd:         3.50 cm LVIDs:         2.10 cm LV PW:         1.20 cm LV IVS:        0.90 cm  LEFT ATRIUM         Index Karen diam:    3.60 cm 1.84 cm/m                        PULMONIC VALVE AORTA                 PV Vmax:        0.68 m/s Ao Root diam: 2.60 cm PV Vmean:       42.100 cm/s                       PV VTI:         0.119 m                       PV Peak grad:   1.8 mmHg                       PV Mean grad:   1.0 mmHg                       RVOT Peak grad: 3 mmHg  TRICUSPID VALVE TR Peak grad:   40.4 mmHg TR Vmax:        318.00 cm/s  SHUNTS Pulmonic VTI: 0.135 m Kathlyn Sacramento MD Electronically signed by Kathlyn Sacramento MD Signature Date/Time: 08/22/2021/4:08:36 PM     Final    DG Chest Port 1 View  Result Date: 08/22/2021 CLINICAL DATA:  S/p LEFT thoracentesis EXAM: PORTABLE CHEST 1 VIEW COMPARISON:  IR ultrasound, earlier same day and 08/16/2021. Chest XR, earlier same day. FINDINGS: Cardiac silhouette is enlarged and unchanged. Aortic arch calcifications. Low lung volumes. Interval removal of LEFT pleural fluid with trace residual. Small volume RIGHT pleural effusion is unchanged. No pneumothorax. Vascular congestion with perihilar interstitial thickening, and linear opacity at the LEFT lung base. No focal consolidation. No interval osseous abnormality IMPRESSION: 1. Interval removal of LEFT pleural fluid with trace residual. No pneumothorax 2. Otherwise unchanged pulmonary findings, with cardiomegaly and mild pulmonary edema with small volume RIGHT pleural effusion. Electronically Signed   By: Michaelle Birks M.D.   On: 08/22/2021 13:36   US THORACENTESIS ASP PLEURAL SPACE W/IMG GUIDE  Result Date: 08/22/2021 INDICATION: Symptomatic LEFT sided pleural effusion EXAM: US THORACENTESIS ASP PLEURAL SPACE W/IMG GUIDE COMPARISON:  Chest XR, earlier same day. IR ultrasound and CT chest, 08/16/2021. MEDICATIONS: None. COMPLICATIONS: None immediate. TECHNIQUE: Informed written consent was obtained from the the patient and/or patient's representative after a discussion of the risks, benefits and alternatives to treatment. A timeout was performed prior to the initiation of the procedure. Initial ultrasound scanning demonstrates a small volume LEFT pleural effusion. The lower chest was prepped and draped in the usual sterile fashion. 1% lidocaine was used for local anesthesia. An ultrasound image was saved for documentation purposes.  An 8 Fr Safe-T-Centesis catheter was introduced. The thoracentesis was performed. The catheter was removed and a dressing was applied. The patient tolerated the procedure well without immediate post procedural complication. An immediate follow-up upright  chest radiograph was requested. FINDINGS: A total of approximately 850 mL of serous fluid was removed. IMPRESSION: Successful ultrasound-guided LEFT therapeutic thoracentesis yielding 850 mL of pleural fluid. Michaelle Birks, MD [facility]iology Electronically Signed   By: Michaelle Birks M.D.   On: 08/22/2021 13:18   DG Chest Port 1 View  Result Date: 08/22/2021 CLINICAL DATA:  Hypoxia. EXAM: PORTABLE CHEST 1 VIEW COMPARISON:  08/21/2021 FINDINGS: 0410 hours. The cardio pericardial silhouette is enlarged. Bibasilar collapse/consolidation is similar to prior with bilateral pleural effusions, left greater than right also not substantially changed. Mild vascular congestion noted bilaterally with some improvement in aeration of the upper lungs. The visualized bony structures of the thorax are unremarkable. Telemetry leads overlie the chest. IMPRESSION: Stable exam. Bibasilar collapse/consolidation with bilateral pleural effusions, left greater than right. Electronically Signed   By: Misty Stanley M.D.   On: 08/22/2021 05:31            LOS: 11 days      Emeterio Reeve, DO [facility] 08/25/2021, 7:47 AM   Staff may message me via secure chat in [place]  but this may not receive immediate response,  please page for urgent matters!  If 7PM-7AM, please contact night-coverage [email or web address]  Dictation software was used to generate the above note. Typos may occur and escape review, as with typed/written notes. Please contact Dr Sheppard Coil directly for clarity if needed.

## 2021-08-25 NOTE — Progress Notes (Signed)
Cross Cover Semglee dose decreased to 22 units BID per diabetes coordinator recommendations

## 2021-08-25 NOTE — Progress Notes (Signed)
PT off HHFNC and placed on BIPAP for HS. Tolerating well at this time, protective barrier gel placed on nose. SpO2 96%, HR 68, RR 21. FiO2 titrated down from 60% to 55%.

## 2021-08-25 NOTE — Progress Notes (Signed)
*  PRELIMINARY RESULTS* Echocardiogram 2D Echocardiogram has been performed.  Karen [name] Char [name] 08/25/2021, 1:17 PM

## 2021-08-25 NOTE — Progress Notes (Signed)
Progress Note  Patient Name: Karen [name] Date of Encounter: 08/25/2021  [facility] Cardiologist: Ida Rogue, MD   Subjective   Patient feels breathing is worse. No chest pain. Afib rates around 100bpm.  Inpatient Medications    Scheduled Meds:  allopurinol  100 mg Oral Daily   apixaban  5 mg Oral BID   atorvastatin  10 mg Oral Daily   budesonide (PULMICORT) nebulizer solution  0.25 mg Nebulization BID   diltiazem  180 mg Oral Daily   empagliflozin  10 mg Oral Daily   ferrous sulfate  325 mg Oral Q breakfast   fluticasone  2 spray Each Nare Daily   insulin aspart  0-20 Units Subcutaneous TID WC   insulin aspart  10 Units Subcutaneous TID WC   insulin glargine-yfgn  25 Units Subcutaneous BID   ipratropium  2 spray Each Nare QID   lidocaine  1 patch Transdermal Q24H   melatonin  5 mg Oral QHS   montelukast  10 mg Oral QHS   pantoprazole  40 mg Oral Daily   spironolactone  12.5 mg Oral Daily   torsemide  40 mg Oral BID   umeclidinium-vilanterol  1 puff Inhalation Daily   vitamin B-12  500 mcg Oral Daily   Continuous Infusions:  PRN Meds: acetaminophen, albuterol, ipratropium-albuterol, morphine injection, mouth rinse, oxyCODONE, senna-docusate, traZODone   Vital Signs    Vitals:   08/25/21 0427 08/25/21 0500 08/25/21 0800 08/25/21 0808  BP: 114/60  (!) 90/57   Pulse: 64  92   Resp: 20  20   Temp: 97.9 F (36.6 C)  97.7 F (36.5 C)   TempSrc: Oral  Oral   SpO2: 95%  92% 90%  Weight:  93.4 kg    Height:        Intake/Output Summary (Last 24 hours) at 08/25/2021 1116 Last data filed at 08/25/2021 0543 Gross per 24 hour  Intake 240 ml  Output 1700 ml  Net -1460 ml      08/25/2021    5:00 AM 08/24/2021    3:14 AM 08/23/2021    4:57 AM  Last 3 Weights  Weight (lbs) 205 lb 14.6 oz 206 lb 3.2 oz 203 lb 14.4 oz  Weight (kg) 93.4 kg 93.532 kg 92.488 kg      Telemetry    Afib/flutter HR 80-110 - Personally Reviewed  ECG    No new - Personally  Reviewed  Physical Exam   GEN: No acute distress.   Neck: No JVD Cardiac: Irreg Ireg, no murmurs, rubs, or gallops.  Respiratory: diffusely diminished GI: Soft, nontender, non-distended  MS: No edema; No deformity. Neuro:  Nonfocal  Psych: Normal affect   Labs    High Sensitivity Troponin:   Recent Labs  Lab 08/03/21 1324 08/03/21 1549 08/14/21 1518 08/14/21 1653  TROPONINIHS 11 10 16 12      Chemistry Recent Labs  Lab 08/23/21 0549 08/24/21 0605 08/25/21 0544  NA 137 136 135  K 4.0 3.6 3.7  CL 88* 86* 86*  CO2 38* 41* 39*  GLUCOSE 90 179* 63*  BUN 46* 49* 46*  CREATININE 1.11* 1.11* 1.01*  CALCIUM 9.1 8.8* 8.9  GFRNONAA 51* 51* 57*  ANIONGAP 11 9 10     Lipids No results for input(s): "CHOL", "TRIG", "HDL", "LABVLDL", "LDLCALC", "CHOLHDL" in the last 168 hours.  Hematology Recent Labs  Lab 08/23/21 0549 08/24/21 0605 08/25/21 0544  WBC 17.9* 13.7* 15.6*  RBC 3.67* 3.39* 3.65*  HGB 10.4* 9.5*  10.2*  HCT 32.9* 30.1* 32.5*  MCV 89.6 88.8 89.0  MCH 28.3 28.0 27.9  MCHC 31.6 31.6 31.4  RDW 14.8 14.8 14.6  PLT 337 318 338   Thyroid No results for input(s): "TSH", "FREET4" in the last 168 hours.  BNPNo results for input(s): "BNP", "PROBNP" in the last 168 hours.  DDimer No results for input(s): "DDIMER" in the last 168 hours.   Radiology    No results found.  Cardiac Studies   Echocardiogram completed 08/22/2021 1. Left ventricular ejection fraction, by estimation, is 50 to 55%. The  left ventricle has low normal function. The left ventricle has no regional  wall motion abnormalities. Left ventricular diastolic function could not  be evaluated.   2. Right ventricular systolic function is normal. The right ventricular  size is normal. There is moderately elevated pulmonary artery systolic  pressure.   3. A small pericardial effusion is present. The pericardial effusion is  circumferential. Moderate pleural effusion in the left lateral region.   4.  The mitral valve is normal in structure. No evidence of mitral valve  regurgitation. No evidence of mitral stenosis.   5. The aortic valve is normal in structure. Aortic valve regurgitation is  not visualized. No aortic stenosis is present.   6. The inferior vena cava is dilated in size with <50% respiratory  variability, suggesting right atrial pressure of 15 mmHg.    Limited echo completed 08/20/2021 1. Left ventricular ejection fraction, by estimation, is 55 to 60%. The  left ventricle has normal function.   2. Right ventricular systolic function is low normal.   3. A small pericardial effusion is present. Large pleural effusion in the  left lateral region.   4. The aortic valve is calcified.   5. The inferior vena cava is dilated in size with <50% respiratory  variability, suggesting right atrial pressure of 15 mmHg.    Echocardiogram completed 08/16/2021 1. Left ventricular ejection fraction, by estimation, is 55 to 60%. The  left ventricle has normal function. The left ventricle has no regional  wall motion abnormalities. There is moderate left ventricular hypertrophy.   2. Right ventricular systolic function is moderately reduced. The right  ventricular size is normal. Moderately increased right ventricular wall  thickness. There is severely elevated pulmonary artery systolic pressure.   3. Moderate pericardial effusion. The pericardial effusion is  circumferential, though the largest fluid collections are inferolateral to  the LV and adjacent to the RA. There is no definite evidence of tamponade  physiology, though evaluation is limited  without mitral/tricuspid valve Doppler waveforms to assess for respiratory  variation.   4. Mild mitral valve regurgitation.   5. Tricuspid valve regurgitation is moderate.   6. The aortic valve has an indeterminant number of cusps. There is  moderate calcification of the aortic valve. There is moderate thickening  of the aortic valve.   7.  The inferior vena cava is dilated in size with <50% respiratory  variability, suggesting right atrial pressure of 15 mmHg.    Echocardiogram completed 08/04/2021  1. Left ventricular ejection fraction, by estimation, is 55 to 60%. The  left ventricle has normal function. The left ventricle has no regional  wall motion abnormalities. Left ventricular diastolic parameters are  consistent with Grade III diastolic  dysfunction (restrictive). Elevated left atrial pressure. There is the  interventricular septum is flattened in systole, consistent with right  ventricular pressure overload.   2. Right ventricular systolic function is mildly reduced. The right  ventricular size is moderately enlarged. There is moderately elevated  pulmonary artery systolic pressure.   3. Left atrial size was mildly dilated.   4. The mitral valve is degenerative. Mild mitral valve regurgitation. No  evidence of mitral stenosis.   5. Tricuspid valve regurgitation is moderate.   6. The aortic valve has an indeterminant number of cusps. There is mild  calcification of the aortic valve. There is moderate thickening of the  aortic valve. Aortic valve regurgitation is not visualized. Mild to  moderate aortic valve stenosis. Aortic  valve area, by VTI measures 1.20 cm. Aortic valve mean gradient measures  10.3 mmHg.   7. The inferior vena cava is dilated in size with <50% respiratory  variability, suggesting right atrial pressure of 15 mmHg.     Patient Profile     76 y.o. female  with a history of HFpEF, COPD, obesity, respiratory failure, presenting with shortness of breath and edema, diagnosed with pneumonia and respiratory failure being seen for A-fib RVR,HFpEF, and pericardial effusion.  Assessment & Plan    HFpEF B/l pleural effusion - torsemide 40mg  BID. PTA torsemide ?40mg  daily - s/p thoracentesis 6/19 yielfing -859mL fluid - Jardiance - spironolactone - Net -13L since admission - kidney function  stable - CXR for persistent SOB - Also BP is low, I will recheck a limited echo  Pericardial effusion - repeat echo 6/19 showed EF 50-55% with small pericardial effusion without tamponade - can follow with repeat limited echocardiograms, limited echo today as above  Permanent Afib - rates relateively controlled on diltazem 180mg  daily - continue Eliquis 5mg  BID  Acute on chronic hypoxic respiratory failure - multifactorial CHF, pleural effusions, pericardial effusion, HAP, COPD - CXR for SOB as above  GOC - palliative follow-up  For questions or updates, please contact [facility] Please consult [email or web address] for contact info under        Signed, Jaimie Pippins Ninfa Meeker, PA-C  08/25/2021, 11:16 AM

## 2021-08-26 DIAGNOSIS — N179 Acute kidney failure, unspecified: Secondary | ICD-10-CM | POA: Diagnosis not present

## 2021-08-26 DIAGNOSIS — J9611 Chronic respiratory failure with hypoxia: Secondary | ICD-10-CM | POA: Diagnosis not present

## 2021-08-26 DIAGNOSIS — Y95 Nosocomial condition: Secondary | ICD-10-CM | POA: Diagnosis not present

## 2021-08-26 DIAGNOSIS — J189 Pneumonia, unspecified organism: Secondary | ICD-10-CM | POA: Diagnosis not present

## 2021-08-26 DIAGNOSIS — I480 Paroxysmal atrial fibrillation: Secondary | ICD-10-CM | POA: Diagnosis not present

## 2021-08-26 DIAGNOSIS — N189 Chronic kidney disease, unspecified: Secondary | ICD-10-CM

## 2021-08-26 DIAGNOSIS — I5033 Acute on chronic diastolic (congestive) heart failure: Secondary | ICD-10-CM | POA: Diagnosis not present

## 2021-08-26 LAB — GLUCOSE, CAPILLARY
Glucose-Capillary: 163 mg/dL — ABNORMAL HIGH (ref 70–99)
Glucose-Capillary: 171 mg/dL — ABNORMAL HIGH (ref 70–99)
Glucose-Capillary: 388 mg/dL — ABNORMAL HIGH (ref 70–99)
Glucose-Capillary: 158 mg/dL — ABNORMAL HIGH (ref 70–99)
Glucose-Capillary: 80 mg/dL (ref 70–99)

## 2021-08-26 LAB — CBC
HCT: 33 % — ABNORMAL LOW (ref 36.0–46.0)
Hemoglobin: 10.2 g/dL — ABNORMAL LOW (ref 12.0–15.0)
MCH: 27.7 pg (ref 26.0–34.0)
MCHC: 30.9 g/dL (ref 30.0–36.0)
MCV: 89.7 fL (ref 80.0–100.0)
Platelets: 348 10*3/uL (ref 150–400)
RBC: 3.68 MIL/uL — ABNORMAL LOW (ref 3.87–5.11)
RDW: 14.7 % (ref 11.5–15.5)
WBC: 16.1 10*3/uL — ABNORMAL HIGH (ref 4.0–10.5)
nRBC: 0 % (ref 0.0–0.2)

## 2021-08-26 LAB — BASIC METABOLIC PANEL
Anion gap: 10 (ref 5–15)
BUN: 43 mg/dL — ABNORMAL HIGH (ref 8–23)
CO2: 42 mmol/L — ABNORMAL HIGH (ref 22–32)
Calcium: 9 mg/dL (ref 8.9–10.3)
Chloride: 85 mmol/L — ABNORMAL LOW (ref 98–111)
Creatinine, Ser: 1.08 mg/dL — ABNORMAL HIGH (ref 0.44–1.00)
GFR, Estimated: 53 mL/min — ABNORMAL LOW (ref >60)
Glucose, Bld: 135 mg/dL — ABNORMAL HIGH (ref 70–99)
Potassium: 4.4 mmol/L (ref 3.5–5.1)
Sodium: 137 mmol/L (ref 135–145)

## 2021-08-27 DIAGNOSIS — Y95 Nosocomial condition: Secondary | ICD-10-CM | POA: Diagnosis not present

## 2021-08-27 DIAGNOSIS — J189 Pneumonia, unspecified organism: Secondary | ICD-10-CM | POA: Diagnosis not present

## 2021-08-27 LAB — CBC
HCT: 31.4 % — ABNORMAL LOW (ref 36.0–46.0)
Hemoglobin: 9.9 g/dL — ABNORMAL LOW (ref 12.0–15.0)
MCH: 28.1 pg (ref 26.0–34.0)
MCHC: 31.5 g/dL (ref 30.0–36.0)
MCV: 89.2 fL (ref 80.0–100.0)
Platelets: 353 10*3/uL (ref 150–400)
RBC: 3.52 MIL/uL — ABNORMAL LOW (ref 3.87–5.11)
RDW: 14.8 % (ref 11.5–15.5)
WBC: 15.7 10*3/uL — ABNORMAL HIGH (ref 4.0–10.5)
nRBC: 0 % (ref 0.0–0.2)

## 2021-08-27 LAB — BASIC METABOLIC PANEL
Anion gap: 12 (ref 5–15)
BUN: 41 mg/dL — ABNORMAL HIGH (ref 8–23)
CO2: 40 mmol/L — ABNORMAL HIGH (ref 22–32)
Calcium: 8.9 mg/dL (ref 8.9–10.3)
Chloride: 81 mmol/L — ABNORMAL LOW (ref 98–111)
Creatinine, Ser: 1 mg/dL (ref 0.44–1.00)
GFR, Estimated: 58 mL/min — ABNORMAL LOW (ref >60)
Glucose, Bld: 90 mg/dL (ref 70–99)
Potassium: 3.7 mmol/L (ref 3.5–5.1)
Sodium: 133 mmol/L — ABNORMAL LOW (ref 135–145)

## 2021-08-27 LAB — GLUCOSE, CAPILLARY
Glucose-Capillary: 101 mg/dL — ABNORMAL HIGH (ref 70–99)
Glucose-Capillary: 249 mg/dL — ABNORMAL HIGH (ref 70–99)
Glucose-Capillary: 211 mg/dL — ABNORMAL HIGH (ref 70–99)
Glucose-Capillary: 179 mg/dL — ABNORMAL HIGH (ref 70–99)
Glucose-Capillary: 182 mg/dL — ABNORMAL HIGH (ref 70–99)
Glucose-Capillary: 196 mg/dL — ABNORMAL HIGH (ref 70–99)

## 2021-08-28 ENCOUNTER — Inpatient Hospital Stay: Payer: Medicare Other

## 2021-08-28 DIAGNOSIS — Y95 Nosocomial condition: Secondary | ICD-10-CM | POA: Diagnosis not present

## 2021-08-28 DIAGNOSIS — J189 Pneumonia, unspecified organism: Secondary | ICD-10-CM | POA: Diagnosis not present

## 2021-08-28 LAB — CREATININE, SERUM
Creatinine, Ser: 1.05 mg/dL — ABNORMAL HIGH (ref 0.44–1.00)
GFR, Estimated: 55 mL/min — ABNORMAL LOW (ref >60)

## 2021-08-28 LAB — GLUCOSE, CAPILLARY
Glucose-Capillary: 168 mg/dL — ABNORMAL HIGH (ref 70–99)
Glucose-Capillary: 226 mg/dL — ABNORMAL HIGH (ref 70–99)
Glucose-Capillary: 199 mg/dL — ABNORMAL HIGH (ref 70–99)
Glucose-Capillary: 225 mg/dL — ABNORMAL HIGH (ref 70–99)

## 2021-08-28 MED ORDER — FUROSEMIDE 10 MG/ML IJ SOLN
40.0000 mg | Freq: Once | INTRAMUSCULAR | Status: AC
Start: 2021-08-28 — End: 2021-08-28
  Administered 2021-08-28: 40 mg via INTRAVENOUS
  Filled 2021-08-28: qty 4

## 2021-08-28 NOTE — Progress Notes (Signed)
RT note:  Patient desaturated earlier in the day necessitating the transition from HFNC to Bipap.  After several hours she was transferred back to HFNC per her request.  Called again by RN at 1630 reference to desaturation.  HFNC uptitrated to 50L/90% and deep breathing exercises attempted.  After sat's remained 85-89%, it was discussed that she may need to go back on the bipap.  The patient in response stated "I do not want to go back on bipap".  Patient able to verbalize that her saturation level is low and the current measures are not able to fix it.  Patient again stated that she did not want to go back on bipap.  RN and MD made aware.

## 2021-08-29 ENCOUNTER — Inpatient Hospital Stay: Payer: Medicare Other

## 2021-08-29 DIAGNOSIS — J189 Pneumonia, unspecified organism: Secondary | ICD-10-CM | POA: Diagnosis not present

## 2021-08-29 DIAGNOSIS — I5033 Acute on chronic diastolic (congestive) heart failure: Secondary | ICD-10-CM | POA: Diagnosis not present

## 2021-08-29 DIAGNOSIS — Z7189 Other specified counseling: Secondary | ICD-10-CM | POA: Diagnosis not present

## 2021-08-29 DIAGNOSIS — Y95 Nosocomial condition: Secondary | ICD-10-CM | POA: Diagnosis not present

## 2021-08-29 LAB — BASIC METABOLIC PANEL
Anion gap: 15 (ref 5–15)
BUN: 41 mg/dL — ABNORMAL HIGH (ref 8–23)
CO2: 40 mmol/L — ABNORMAL HIGH (ref 22–32)
Calcium: 9.1 mg/dL (ref 8.9–10.3)
Chloride: 78 mmol/L — ABNORMAL LOW (ref 98–111)
Creatinine, Ser: 1.08 mg/dL — ABNORMAL HIGH (ref 0.44–1.00)
GFR, Estimated: 53 mL/min — ABNORMAL LOW (ref >60)
Glucose, Bld: 128 mg/dL — ABNORMAL HIGH (ref 70–99)
Potassium: 4.3 mmol/L (ref 3.5–5.1)
Sodium: 133 mmol/L — ABNORMAL LOW (ref 135–145)

## 2021-08-29 LAB — GLUCOSE, CAPILLARY
Glucose-Capillary: 237 mg/dL — ABNORMAL HIGH (ref 70–99)
Glucose-Capillary: 153 mg/dL — ABNORMAL HIGH (ref 70–99)
Glucose-Capillary: 187 mg/dL — ABNORMAL HIGH (ref 70–99)
Glucose-Capillary: 195 mg/dL — ABNORMAL HIGH (ref 70–99)
Glucose-Capillary: 261 mg/dL — ABNORMAL HIGH (ref 70–99)

## 2021-08-29 LAB — CBC
HCT: 33.8 % — ABNORMAL LOW (ref 36.0–46.0)
Hemoglobin: 10.8 g/dL — ABNORMAL LOW (ref 12.0–15.0)
MCH: 27.8 pg (ref 26.0–34.0)
MCHC: 32 g/dL (ref 30.0–36.0)
MCV: 87.1 fL (ref 80.0–100.0)
Platelets: 400 10*3/uL (ref 150–400)
RBC: 3.88 MIL/uL (ref 3.87–5.11)
RDW: 14.8 % (ref 11.5–15.5)
WBC: 22.2 10*3/uL — ABNORMAL HIGH (ref 4.0–10.5)
nRBC: 0 % (ref 0.0–0.2)

## 2021-08-29 LAB — LACTIC ACID, PLASMA: Lactic Acid, Venous: 1.2 mmol/L (ref 0.5–1.9)

## 2021-08-29 LAB — MAGNESIUM: Magnesium: 2.1 mg/dL (ref 1.7–2.4)

## 2021-08-29 MED ORDER — SPIRONOLACTONE 25 MG PO TABS
25.0000 mg | ORAL_TABLET | Freq: Every day | ORAL | Status: DC
  Administered 2021-08-30 – 2021-09-07 (×9): 25 mg via ORAL
  Filled 2021-08-29 (×9): qty 1

## 2021-08-29 MED ORDER — ACETAMINOPHEN 500 MG PO TABS
1000.0000 mg | ORAL_TABLET | Freq: Two times a day (BID) | ORAL | Status: DC
Start: 2021-08-29 — End: 2021-09-07
  Administered 2021-08-29 – 2021-09-07 (×18): 1000 mg via ORAL
  Filled 2021-08-29 (×18): qty 2

## 2021-08-29 MED ORDER — METOLAZONE 5 MG PO TABS
5.0000 mg | ORAL_TABLET | Freq: Every day | ORAL | Status: DC
  Administered 2021-08-29 – 2021-08-30 (×2): 5 mg via ORAL
  Filled 2021-08-29 (×5): qty 1

## 2021-08-29 NOTE — Progress Notes (Signed)
Patient was alert and oriented upon arrival. Patient displayed SOB and struggled to engage in lengthy conversation. Patient desired prayer. Spiritual care was provided through the reading of [place] [address] and a prayer of hope. Spiritual care visit was appreciated.   Rev. Casilda Carls, M.Div.[facility] Chaplain

## 2021-08-29 NOTE — Progress Notes (Signed)
   08/29/21 1300  Clinical Encounter Type  Visited With Patient and family together  Visit Type Initial  Referral From Nurse  Consult/Referral To Chaplain   Chaplain responded to nurse consult. Chaplain provided compassionate presence and reflective listening while patient and husband spoke about hospital stay. Chaplain provided prayer as requested.

## 2021-08-29 NOTE — Progress Notes (Signed)
PT Cancellation Note  Patient Details Name: Karen [name] MRN: [id number] DOB: 1945-12-24   Cancelled Treatment:    Reason Eval/Treat Not Completed: Patient not medically ready.Chart reviewed. Spoke with OT, who just finished speaking with palliative and MD. Pt medically inappropriate for therapy today. Will hold PT tx and continue to follow remotely to re-attempt PT as medically appropriate.    Nolon Bussing, PT, DPT 08/29/21, 10:54 AM

## 2021-08-30 ENCOUNTER — Inpatient Hospital Stay: Payer: Medicare Other

## 2021-08-30 DIAGNOSIS — I272 Pulmonary hypertension, unspecified: Secondary | ICD-10-CM | POA: Diagnosis not present

## 2021-08-30 DIAGNOSIS — Z7189 Other specified counseling: Secondary | ICD-10-CM | POA: Diagnosis not present

## 2021-08-30 DIAGNOSIS — I5033 Acute on chronic diastolic (congestive) heart failure: Secondary | ICD-10-CM | POA: Diagnosis not present

## 2021-08-30 DIAGNOSIS — Y95 Nosocomial condition: Secondary | ICD-10-CM | POA: Diagnosis not present

## 2021-08-30 DIAGNOSIS — J189 Pneumonia, unspecified organism: Secondary | ICD-10-CM | POA: Diagnosis not present

## 2021-08-30 LAB — GLUCOSE, CAPILLARY
Glucose-Capillary: 121 mg/dL — ABNORMAL HIGH (ref 70–99)
Glucose-Capillary: 272 mg/dL — ABNORMAL HIGH (ref 70–99)
Glucose-Capillary: 211 mg/dL — ABNORMAL HIGH (ref 70–99)
Glucose-Capillary: 219 mg/dL — ABNORMAL HIGH (ref 70–99)

## 2021-08-30 MED ORDER — PANTOPRAZOLE SODIUM 20 MG PO TBEC
20.0000 mg | DELAYED_RELEASE_TABLET | Freq: Every day | ORAL | Status: DC
  Administered 2021-08-31 – 2021-09-05 (×6): 20 mg via ORAL
  Filled 2021-08-30 (×9): qty 1

## 2021-08-30 MED ORDER — LEVALBUTEROL HCL 0.63 MG/3ML IN NEBU
0.6300 mg | INHALATION_SOLUTION | Freq: Three times a day (TID) | RESPIRATORY_TRACT | Status: DC | PRN
  Administered 2021-09-03 – 2021-09-05 (×3): 0.63 mg via RESPIRATORY_TRACT
  Filled 2021-08-30 (×5): qty 3

## 2021-08-30 NOTE — Progress Notes (Signed)
PULMONOLOGY         Date: 08/30/2021,   MRN# [id number] Karen [name] 08/31/1945     AdmissionWeight: 93.5 kg                 CurrentWeight: 88.9 kg  Referring provider: Dr. Sheppard Coil   CHIEF COMPLAINT:   Recurrent pleural effusion with acute on chronic hypoxemic respiratory failure   HISTORY OF PRESENT ILLNESS   This is a pleasant 76 year old female with heart failure with preserved EF at 50%, recurrent hypoxemia, CKD, advanced COPD, diabetes, peripheral edema, GI bleeding history of dyslipidemia essential hypertension iron deficiency anemia valvular heart disease history of GI bleeding who came in with chest discomfort x2 days and has been hospitalized already for 14 days initial admission 08/14/2021.  Initially diagnosed with pneumonia status post full scope of therapy for community-acquired pneumonia.  She generally wears about 3 L/min nasal cannula.  On arrival she received IV resuscitative fluids and empiric cefepime and vancomycin noted to have accelerated atrial fibrillation treated with IV diltiazem with mild improvement however development of bilateral large pleural effusion status postthoracentesis.  Despite all this she continues to deteriorate and has had palliative care evaluation with CODE STATUS advance to DNR.  Today during discussion she wishes to continue full scope of therapy at this moment is on noninvasive ventilator with BiPAP.  She had 850 cc straw-colored fluid removed via left thoracentesis 08/16/2021 and same 1 week later CT chest independently reviewed by me with bibasilar atelectasis and compressive atelectasis of the left lung with surrounding effusion as well as pericardial effusion. PCCM consultation for further evaluation and management   08/30/21 - patient remains hypoxemic. Slight improvement on HFNC currently on 15L/min 75% spO2>90%.  S/p Therapeutic Thora >1.1L drained. Repeat CXR today for interval changes post Metaneb recruitment. Remains on  aggressive diuretic regimen with Metolazone, aldactone, torsemide currently 24L net negative. Today further reduced non essential nephrotoxins by reducing protonix to 20mg  po daily, due to AF have dcd Albuterol and Duoneb and Anoro ellipta inhaler and will use xopenex only q8h PRN for now.  Amiodarone has been dcd appreciate cardiology input. She should try to use BIPAP more instead of HFNC due to hypercapnia which is likely to get worse in context of contraction alkalosis. Diet modified to Renal fluid restricted 1200cc/24h.    PAST MEDICAL HISTORY   Past Medical History:  Diagnosis Date   (HFpEF) heart failure with preserved ejection fraction ([place])    a. 2017 Echo: EF 50%; b. 06/2018 Echo: EF 50-55%; c. 08/2018 Echo: EF 50-55%, Nl RV fxn; d. 09/2019 Echo: EF 55-60%, no rwma, mild LVH, Gr1 DD, nl RV size/fxn, PASP 63.34mmHg. Mildly dil LA. Triv MR. Mod AS (AoV 0.94cm^2 VTI; mean grad 17.27mmHg); d. 02/2021 Echo: EF 60-65%, no rwma, GrI DD, mildly red RV fxn, RVSP 64.33mmHg, mild-mod MR, mod-sev TR, mild AS.   Acute on chronic respiratory failure with hypoxia and hypercapnia (HCC) 01/07/2015   Anemia    Asterixis 01/07/2015   Asthma    Cataract    CKD (chronic kidney disease), stage III (HCC)    COPD (chronic obstructive pulmonary disease) ([place])    a. 06/2018 tobacco use, home 3L oxygen    Diabetes mellitus without complication ([place])    a. 09/2019 A1C 8.8   Edema, peripheral 04/20/2014   GI bleed 06/28/2019   History of kidney stones    Hyperlipidemia    Hypertension    Iron deficiency anemia 06/22/2014  Junctional bradycardia    a. In setting of beta blocker therapy.   Leucocytosis 10/19/2015   Moderate aortic stenosis    a.  09/2019 Echo: Mod AS (AoV 0.94cm^2 VTI; mean grad 17.31mmHg); b. 02/2021 Echo: Mild AS.   Morbid obesity ([place])    Overactive bladder    Primary osteoarthritis of right knee 09/01/2016   Sciatica 01/07/2015   Valvular heart disease    a. 02/2021 Echo: EF 60-65%, no rwma,  GrI DD, mild-mod MR, mod-sev TR, mild AS     SURGICAL HISTORY   Past Surgical History:  Procedure Laterality Date   APPENDECTOMY     CESAREAN SECTION     x3   CHOLECYSTECTOMY     COLONOSCOPY WITH PROPOFOL N/A 08/28/2017   Procedure: COLONOSCOPY WITH PROPOFOL;  Surgeon: Lucilla Lame, MD;  Location: [facility] ENDOSCOPY;  Service: Endoscopy;  Laterality: N/A;   COLONOSCOPY WITH PROPOFOL N/A 08/29/2017   Procedure: COLONOSCOPY WITH PROPOFOL;  Surgeon: Lucilla Lame, MD;  Location: [facility] ENDOSCOPY;  Service: Endoscopy;  Laterality: N/A;   CYSTOSCOPY W/ URETERAL STENT PLACEMENT Right 09/15/2017   Procedure: CYSTOSCOPY WITH RETROGRADE PYELOGRAM/URETERAL STENT PLACEMENT;  Surgeon: Cleon Gustin, MD;  Location: [facility] ORS;  Service: Urology;  Laterality: Right;   CYSTOSCOPY/URETEROSCOPY/HOLMIUM LASER/STENT PLACEMENT Right 10/09/2017   Procedure: CYSTOSCOPY/URETEROSCOPY/HOLMIUM LASER/STENT PLACEMENT;  Surgeon: Abbie Sons, MD;  Location: [facility] ORS;  Service: Urology;  Laterality: Right;  right Stent exchange   ESOPHAGOGASTRODUODENOSCOPY (EGD) WITH PROPOFOL N/A 09/16/2018   Procedure: ESOPHAGOGASTRODUODENOSCOPY (EGD) WITH PROPOFOL;  Surgeon: Lin Landsman, MD;  Location: [facility];  Service: Gastroenterology;  Laterality: N/A;   EYE SURGERY     IR THORACENTESIS ASP PLEURAL SPACE W/IMG GUIDE  02/07/2021     FAMILY HISTORY   Family History  Problem Relation Age of Onset   Other Mother        unknown medical history   Other Father        unknown medical history     SOCIAL HISTORY   Social History   Tobacco Use   Smoking status: Former    Packs/day: 1.00    Years: 20.00    Total pack years: 20.00    Types: Cigarettes    Quit date: 12/04/1992    Years since quitting: 28.7   Smokeless tobacco: Never  Vaping Use   Vaping Use: Never used  Substance Use Topics   Alcohol use: No   Drug use: No     MEDICATIONS    Home Medication:    Current Medication:  Current  Facility-Administered Medications:    acetaminophen (TYLENOL) tablet 1,000 mg, 1,000 mg, Oral, BID, Deliah Strehlow, MD, 1,000 mg at 08/29/21 2141   acetaminophen (TYLENOL) tablet 500-1,000 mg, 500-1,000 mg, Oral, Q6H PRN, Norins, Heinz Knuckles, MD, 1,000 mg at 08/29/21 1203   albuterol (PROVENTIL) (2.5 MG/3ML) 0.083% nebulizer solution 3 mL, 3 mL, Inhalation, Q6H PRN, Norins, Heinz Knuckles, MD   allopurinol (ZYLOPRIM) tablet 100 mg, 100 mg, Oral, Daily, Norins, Heinz Knuckles, MD, 100 mg at 08/29/21 1025   apixaban (ELIQUIS) tablet 5 mg, 5 mg, Oral, BID, Agbor-Etang, Brian, MD, 5 mg at 08/29/21 2141   atorvastatin (LIPITOR) tablet 10 mg, 10 mg, Oral, Daily, Norins, Heinz Knuckles, MD, 10 mg at 08/29/21 8527   empagliflozin (JARDIANCE) tablet 10 mg, 10 mg, Oral, Daily, Norins, Heinz Knuckles, MD, 10 mg at 08/29/21 0919   ferrous sulfate tablet 325 mg, 325 mg, Oral, Q breakfast, Norins, Heinz Knuckles, MD, 325 mg at 08/29/21 [id number]  fluticasone (FLONASE) 50 MCG/ACT nasal spray 2 spray, 2 spray, Each Nare, Daily, Norins, Heinz Knuckles, MD, 2 spray at 08/28/21 0824   insulin aspart (novoLOG) injection 0-20 Units, 0-20 Units, Subcutaneous, TID WC, Norins, Heinz Knuckles, MD, 4 Units at 08/29/21 1802   insulin aspart (novoLOG) injection 10 Units, 10 Units, Subcutaneous, TID WC, Adhikari, Amrit, MD, 10 Units at 08/29/21 1802   insulin glargine-yfgn (SEMGLEE) injection 22 Units, 22 Units, Subcutaneous, BID, Sharion Settler, NP, 22 Units at 08/29/21 2141   ipratropium (ATROVENT) 0.06 % nasal spray 2 spray, 2 spray, Each Nare, QID, Norins, Heinz Knuckles, MD, 2 spray at 08/28/21 0824   ipratropium-albuterol (DUONEB) 0.5-2.5 (3) MG/3ML nebulizer solution 3 mL, 3 mL, Nebulization, Q6H PRN, Norins, Heinz Knuckles, MD, 3 mL at 08/22/21 1957   lidocaine (LIDODERM) 5 % 1 patch, 1 patch, Transdermal, Q24H, Foust, Katy L, NP, 1 patch at 08/29/21 2142   melatonin tablet 5 mg, 5 mg, Oral, QHS, Norins, Heinz Knuckles, MD, 5 mg at 08/29/21 2141   metolazone (ZAROXOLYN)  tablet 5 mg, 5 mg, Oral, Daily, Ottie Glazier, MD, 5 mg at 08/29/21 2212   Oral care mouth rinse, 15 mL, Mouth Rinse, PRN, Emeterio Reeve, DO   oxyCODONE (Oxy IR/ROXICODONE) immediate release tablet 5 mg, 5 mg, Oral, Q6H PRN, Wyvonnia Dusky, MD, 5 mg at 08/28/21 2148   pantoprazole (PROTONIX) EC tablet 40 mg, 40 mg, Oral, Daily, Norins, Heinz Knuckles, MD, 40 mg at 08/29/21 0910   senna-docusate (Senokot-S) tablet 1 tablet, 1 tablet, Oral, BID BM PRN, Norins, Heinz Knuckles, MD, 1 tablet at 08/18/21 3662   spironolactone (ALDACTONE) tablet 25 mg, 25 mg, Oral, Daily, Ottie Glazier, MD   torsemide (DEMADEX) tablet 60 mg, 60 mg, Oral, BID, Emeterio Reeve, DO, 60 mg at 08/29/21 1802   traZODone (DESYREL) tablet 50 mg, 50 mg, Oral, QHS PRN, Norins, Heinz Knuckles, MD, 50 mg at 08/25/21 2213   umeclidinium-vilanterol (ANORO ELLIPTA) 62.5-25 MCG/ACT 1 puff, 1 puff, Inhalation, Daily, Norins, Heinz Knuckles, MD, 1 puff at 08/29/21 0911    ALLERGIES   Ace inhibitors, Beta adrenergic blockers, Gabapentin, Lisinopril, Lyrica [pregabalin], and Shrimp [shellfish allergy]     REVIEW OF SYSTEMS    Review of Systems:  Gen:  Denies  fever, sweats, chills weigh loss  HEENT: Denies blurred vision, double vision, ear pain, eye pain, hearing loss, nose bleeds, sore throat Cardiac:  No dizziness, chest pain or heaviness, chest tightness,edema Resp:   reports dyspnea chronically  Gi: Denies swallowing difficulty, stomach pain, nausea or vomiting, diarrhea, constipation, bowel incontinence Gu:  Denies bladder incontinence, burning urine Ext:   Denies Joint pain, stiffness or swelling Skin: Denies  skin rash, easy bruising or bleeding or hives Endoc:  Denies polyuria, polydipsia , polyphagia or weight change Psych:   Denies depression, insomnia or hallucinations   Other:  All other systems negative   VS: BP 119/66 (BP Location: Right Arm)   Pulse 74   Temp 98.3 F (36.8 C)   Resp (!) 22   Ht 5\' 3"   (1.6 m)   Wt 88.9 kg   SpO2 95%   BMI 34.72 kg/m      PHYSICAL EXAM    GENERAL:NAD, no fevers, chills, no weakness no fatigue HEAD: Normocephalic, atraumatic.  EYES: Pupils equal, round, reactive to light. Extraocular muscles intact. No scleral icterus.  MOUTH: Moist mucosal membrane. Dentition intact. No abscess noted.  EAR, NOSE, THROAT: Clear without exudates. No external lesions.  NECK: Supple. No thyromegaly.  No nodules. No JVD.  PULMONARY: decreased breath sounds with mild rhonchi worse at bases bilaterally.  CARDIOVASCULAR: S1 and S2. Regular rate and rhythm. No murmurs, rubs, or gallops. No edema. Pedal pulses 2+ bilaterally.  GASTROINTESTINAL: Soft, nontender, nondistended. No masses. Positive bowel sounds. No hepatosplenomegaly.  MUSCULOSKELETAL: No swelling, clubbing, or edema. Range of motion full in all extremities.  NEUROLOGIC: Cranial nerves II through XII are intact. No gross focal neurological deficits. Sensation intact. Reflexes intact.  SKIN: No ulceration, lesions, rashes, or cyanosis. Skin warm and dry. Turgor intact.  PSYCHIATRIC: Mood, affect within normal limits. The patient is awake, alert and oriented x 3. Insight, judgment intact.       IMAGING     ASSESSMENT/PLAN   Acute on chronic hypoxemic respiratory failure -This is secondary to compressive atelectasis from surrounding moderate pleural effusions bilaterally status post repeat thoracentesis. -Culprit of underlying effusions seems to be CKD with CHF status post IV fluid for initial treatment with presumed sepsis.  -Patient is currently on multiple diuretics with relative hypotension precluding from making adequate perfusion and diuresis. -We discussed Pleurx catheter momentarily and she wishes for additional discussion with her family as well as review of cost and management. -I have changed some of her medication we discussed reducing narcotics and other nonessential nephrotoxins for now. -Due to  relative hypotension will start amiodarone and hold Cardizem to allow [place] to diurese her a little better -She will need recruitment maneuvers with MetaNeb device to help wean down oxygen -Increase diuresis with metolazone increased Aldactone to 25mg   daily continuing to Demadex at current dose 60 mg daily.  Monitor vital signs.    Bilateral compressive atelectasis Chest physiotherapy with recruitment maneuvers utilizing MetaNeb            Thank you for allowing me to participate in the care of this patient.   Patient/Family are satisfied with care plan and all questions have been answered.    Provider disclosure: Patient with at least one acute or chronic illness or injury that poses a threat to life or bodily function and is being managed actively during this encounter.  All of the below services have been performed independently by signing provider:  review of prior documentation from internal and or external health records.  Review of previous and current lab results.  Interview and comprehensive assessment during patient visit today. Review of current and previous chest radiographs/CT scans. Discussion of management and test interpretation with health care team and patient/family.   This document was prepared using Dragon voice recognition software and may include unintentional dictation errors.     Ottie Glazier, M.D.  Division of Pulmonary & Critical Care Medicine

## 2021-08-30 NOTE — Progress Notes (Addendum)
Daily Progress Note   Patient Name: Karen [name]       Date: 08/30/2021 DOB: [birth year]  Age: 76 y.o. MRN#: [id number] Attending Physician: Sunnie Nielsen, DO Primary Care Physician: Dan Humphreys, MD Admit Date: 08/14/2021  Reason for Consultation/Follow-up: Establishing goals of care  Subjective: Notes and labs reviewed. In to see patient. Patient is sleeping soundly and does not awaken to speaking her name. No distress noted. Bedside tele is in place with O2 sat in mid 90's. Thoracentesis completed yesterday, and currently off heated high flow cannula, and on O2 by simple cannula at 15 lpm with wall O2 hook up. Would recommend humidifying this, though at the current rate, she likely will still have nasal drying. No family at bedside.   Length of Stay: 16  Current Medications: Scheduled Meds:  . acetaminophen  1,000 mg Oral BID  . allopurinol  100 mg Oral Daily  . apixaban  5 mg Oral BID  . atorvastatin  10 mg Oral Daily  . empagliflozin  10 mg Oral Daily  . ferrous sulfate  325 mg Oral Q breakfast  . fluticasone  2 spray Each Nare Daily  . insulin aspart  0-20 Units Subcutaneous TID WC  . insulin aspart  10 Units Subcutaneous TID WC  . insulin glargine-yfgn  22 Units Subcutaneous BID  . lidocaine  1 patch Transdermal Q24H  . melatonin  5 mg Oral QHS  . metolazone  5 mg Oral Daily  . [START ON 08/31/2021] pantoprazole  20 mg Oral Daily  . spironolactone  25 mg Oral Daily  . torsemide  60 mg Oral BID    Continuous Infusions:   PRN Meds: acetaminophen, levalbuterol, mouth rinse, oxyCODONE, senna-docusate, traZODone  Physical Exam Constitutional:      Comments: Eyes closed.   Pulmonary:     Comments: 15 lpm of O2 by wall hook up [place].  Skin:    General: Skin is  warm and dry.             Vital Signs: BP 113/73 (BP Location: Left Arm)   Pulse 96   Temp 98.2 F (36.8 C) (Oral)   Resp 18   Ht 5\' 3"  (1.6 m)   Wt 88.9 kg   SpO2 90%   BMI 34.72 kg/m  SpO2: SpO2: 90 % O2  Device: O2 Device: High Flow Nasal Cannula O2 Flow Rate: O2 Flow Rate (L/min): 15 L/min  Intake/output summary:  Intake/Output Summary (Last 24 hours) at 08/30/2021 1528 Last data filed at 08/30/2021 1114 Gross per 24 hour  Intake --  Output 3700 ml  Net -3700 ml   LBM: Last BM Date : 08/29/21 Baseline Weight: Weight: 93.5 kg Most recent weight: Weight: 88.9 kg         Patient Active Problem List   Diagnosis Date Noted  . Pericardial effusion   . Permanent atrial fibrillation (HCC)   . Sepsis due to pneumonia (HCC) 08/14/2021  . HAP (hospital-acquired pneumonia) 08/14/2021  . Severe pulmonary hypertension (HCC) 05/29/2021  . OSA (obstructive sleep apnea) 05/29/2021  . Elevated troponin 05/28/2021  . Lower abdominal pain 05/28/2021  . Nausea and vomiting 05/28/2021  . Prolonged QT interval 05/28/2021  . At risk for fall due to comorbid condition 05/02/2021  . Pleural effusion   . Acute kidney injury superimposed on CKD (HCC)   . Impaired gait and mobility 12/20/2020  . Pulmonary nodules/lesions, multiple 10/29/2020  . Anemia in chronic kidney disease 10/29/2020  . Aortic stenosis, moderate   . AF (paroxysmal atrial fibrillation) (HCC)   . Gastroesophageal reflux disease without esophagitis   . Chronic respiratory failure with hypercapnia (HCC) 05/21/2020  . Severe sepsis (HCC) 05/21/2020  . UTI (urinary tract infection) 05/21/2020  . Hyperglycemia due to type 2 diabetes mellitus (HCC) 05/21/2020  . Abnormal CT of liver 05/21/2020  . 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 05/21/2020  . Morbid obesity (HCC) 05/08/2020  . Athscl heart disease 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 03/06/2020  . Acute hip pain, left 10/23/2019  . Lumbar stenosis with  neurogenic claudication 10/23/2019  . COPD with acute exacerbation (HCC) 09/28/2019  . Acute diastolic CHF (congestive heart failure) (HCC) 09/28/2019  . (HFpEF) heart failure with preserved ejection fraction (HCC) 09/27/2019  . Hypokalemia 06/28/2019  . Hyponatremia 06/28/2019  . Uncontrolled type 2 diabetes mellitus with hyperglycemia, with long-term current use of insulin (HCC) 06/28/2019  . CKD (chronic kidney disease), stage IIIa 06/28/2019  . Atrial fibrillation, chronic (HCC) 06/28/2019  . Pulmonary edema 02/27/2019  . Bradycardia 02/24/2019  . Acute on chronic diastolic CHF (congestive heart failure) (HCC) 02/23/2019  . Chronic respiratory failure with hypoxia (HCC) 02/23/2019  . Chest pain 02/13/2019  . Osteopenia of neck of left femur 11/20/2018  . AVM (arteriovenous malformation) of small bowel, acquired   . Acute gastric ulcer with hemorrhage   . Chronic diastolic heart failure (HCC) 08/22/2018  . Diarrhea 08/22/2018  . Junctional bradycardia   . Acute on chronic heart failure with preserved ejection fraction (HFpEF) (HCC)   . AKI (acute kidney injury) (HCC)   . Symptomatic bradycardia 08/05/2018  . Acute on chronic respiratory failure (HCC) 06/25/2018  . 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 diabetes mellitus (HCC) 01/25/2018  . History of non anemic vitamin B12 deficiency 01/25/2018  . Personal history of kidney stones 11/12/2017  . Urge incontinence 11/12/2017  . History of leukocytosis 09/17/2017  . GI bleed 08/26/2017  . Arthritis 08/10/2017  . Stage 4 chronic kidney disease (HCC) 08/10/2017  . COPD (chronic obstructive pulmonary disease) (HCC) 08/10/2017  . Diabetes mellitus type 2, uncomplicated (HCC) 08/10/2017  . Hypertension 08/10/2017  . Obesity (BMI 35.0-39.9 without comorbidity) 04/11/2017  . Primary osteoarthritis of right knee 09/01/2016  . Leucocytosis 10/19/2015  . 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 Acute on chronic respiratory  failure with  hypoxia and hypercapnia (HCC) 01/07/2015  . Sciatica 01/07/2015  . Weakness 01/07/2015  . 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 Iron deficiency anemia 06/22/2014  . Microalbuminuria 06/22/2014  . CHF (congestive heart failure) (HCC) 04/20/2014  . Edema, peripheral 04/20/2014    Palliative Care Assessment & Plan    Recommendations/Plan: Continue current care. Currently off high flow cannula and on simple nasal cannula at 15 lpm.    Code Status:    Code Status Orders  (From admission, onward)           Start     Ordered   08/14/21 1844  Do not attempt resuscitation (DNR)  Continuous       Question Answer Comment  In the event of cardiac or respiratory ARREST Do not call a "code blue"   In the event of cardiac or respiratory ARREST Do not perform Intubation, CPR, defibrillation or ACLS   In the event of cardiac or respiratory ARREST Use medication by any route, position, wound care, and other measures to relive pain and suffering. May use oxygen, suction and manual treatment of airway obstruction as needed for comfort.      08/14/21 1849           Code Status History     Date Active Date Inactive Code Status Order ID Comments User Context   08/03/2021 1714 08/10/2021 2302 DNR [id number]  Verdia Kuba, DO ED   05/28/2021 1927 06/02/2021 2335 DNR [id number]  Clydie Braun, MD ED   05/28/2021 1851 05/28/2021 1927 Full Code [id number]  Clydie Braun, MD ED   02/03/2021 1805 02/12/2021 1902 DNR [id number]  Almon Hercules, MD Inpatient   02/01/2021 1653 02/03/2021 1805 Full Code [id number]  Lucile Shutters, MD ED   10/29/2020 1650 11/09/2020 1747 Full Code [id number]  Cox, Amy N, DO ED   06/17/2020 2215 06/21/2020 2140 Full Code [id number]  Mansy, Vernetta Honey, MD ED   05/21/2020 2251 05/25/2020 1950 Full Code [id number]  Andris Baumann, MD Inpatient   09/28/2019 0120 10/02/2019 2116 DNR [id number]  Charlane Ferretti, DO ED   06/28/2019 1333 06/30/2019 1619 DNR [id number]   Lorretta Harp, MD Inpatient   06/28/2019 1255 06/28/2019 1333 DNR [id number]  Lorretta Harp, MD Inpatient   02/23/2019 1938 02/27/2019 1810 Full Code [id number]  Alberteen Sam, MD Inpatient   02/13/2019 2011 02/16/2019 1819 DNR [id number]  Arvilla Market, DO ED   09/14/2018 2220 09/17/2018 2350 DNR [id number]  Houston Siren, MD Inpatient   08/05/2018 1547 08/10/2018 1949 Full Code [id number]  Enid Baas, MD Inpatient   06/25/2018 1417 06/30/2018 1557 Full Code [id number]  Katha Hamming, MD ED   09/15/2017 1747 09/18/2017 2106 DNR [id number]  Milagros Loll, MD ED   08/26/2017 2216 08/30/2017 1604 Full Code [id number]  Ramonita Lab, MD Inpatient   01/08/2015 0307 01/09/2015 1336 Full Code [id number]  Katharina Caper, MD Inpatient      Advance Directive Documentation    Flowsheet Row Most Recent Value  Type of Advance Directive Out of facility DNR (pink MOST or yellow form)  Pre-existing out of facility DNR order (yellow form or pink MOST form) --  "MOST" Form in Place? --       Prognosis:  Poor overall    Thank you for allowing the Palliative Medicine Team to assist in the care of this patient.     Morton Stall, NP  Please contact Palliative Medicine Team phone at  [phone] for questions and concerns.

## 2021-08-30 NOTE — Progress Notes (Signed)
Progress Note  Patient Name: Karen [name] Date of Encounter: 08/30/2021  [facility] Cardiologist: Julien Nordmann, MD   Subjective   Patient seen on AM rounds. Denies any chest pain but continues with shortness of breath. Continues to state that she feels lousy. Remains on heated high flow oxygen this morning with O2 sats 90-92%. -2L in the last 24 hours. WBC elevated to 22. Metolazone started yesterday. Underwent additional thoracentesis yesterday afternoon.   Inpatient Medications    Scheduled Meds:  acetaminophen  1,000 mg Oral BID   allopurinol  100 mg Oral Daily   apixaban  5 mg Oral BID   atorvastatin  10 mg Oral Daily   empagliflozin  10 mg Oral Daily   ferrous sulfate  325 mg Oral Q breakfast   fluticasone  2 spray Each Nare Daily   insulin aspart  0-20 Units Subcutaneous TID WC   insulin aspart  10 Units Subcutaneous TID WC   insulin glargine-yfgn  22 Units Subcutaneous BID   ipratropium  2 spray Each Nare QID   lidocaine  1 patch Transdermal Q24H   melatonin  5 mg Oral QHS   metolazone  5 mg Oral Daily   pantoprazole  40 mg Oral Daily   spironolactone  25 mg Oral Daily   torsemide  60 mg Oral BID   umeclidinium-vilanterol  1 puff Inhalation Daily   Continuous Infusions:  PRN Meds: acetaminophen, albuterol, ipratropium-albuterol, mouth rinse, oxyCODONE, senna-docusate, traZODone   Vital Signs    Vitals:   08/30/21 0013 08/30/21 0347 08/30/21 0500 08/30/21 0807  BP: (!) 120/57 119/66  109/65  Pulse: 70 74  71  Resp: 20 (!) 22  18  Temp: 98.4 F (36.9 C) 98.3 F (36.8 C)  98.1 F (36.7 C)  TempSrc:    Oral  SpO2: 99% 95%  93%  Weight:   88.9 kg   Height:        Intake/Output Summary (Last 24 hours) at 08/30/2021 0834 Last data filed at 08/30/2021 0809 Gross per 24 hour  Intake 240 ml  Output 3300 ml  Net -3060 ml      08/30/2021    5:00 AM 08/29/2021    4:11 AM 08/28/2021    4:36 AM  Last 3 Weights  Weight (lbs) 195 lb 15.8 oz 202 lb 2.6  oz 208 lb 8.9 oz  Weight (kg) 88.9 kg 91.7 kg 94.6 kg      Telemetry    Atrial flutter rate of 70-80 - Personally Reviewed  ECG    No new tracings - Personally Reviewed  Physical Exam   GEN: ill-appearing,  laying in bed Neck: Difficult to assess JVD due to body habitus and heated high flow straps Cardiac: irregular regular, no murmurs, rubs, or gallops.  Respiratory: Diminished with scattered rhonchi throughout to auscultation bilaterally. Currently on heated high flow at 15 L GI: Soft, nontender, non-distended, obese, bowel sounds present MS: Trace edema; No deformity. Neuro:  Nonfocal  Psych: Normal affect   Labs    High Sensitivity Troponin:   Recent Labs  Lab 08/03/21 1324 08/03/21 1549 08/14/21 1518 08/14/21 1653  TROPONINIHS 11 10 16 12      Chemistry Recent Labs  Lab 08/26/21 0500 08/27/21 0644 08/28/21 0447 08/29/21 0447  NA 137 133*  --  133*  K 4.4 3.7  --  4.3  CL 85* 81*  --  78*  CO2 42* 40*  --  40*  GLUCOSE 135* 90  --  128*  BUN 43* 41*  --  41*  CREATININE 1.08* 1.00 1.05* 1.08*  CALCIUM 9.0 8.9  --  9.1  MG  --   --   --  2.1  GFRNONAA 53* 58* 55* 53*  ANIONGAP 10 12  --  15    Lipids No results for input(s): "CHOL", "TRIG", "HDL", "LABVLDL", "LDLCALC", "CHOLHDL" in the last 168 hours.  Hematology Recent Labs  Lab 08/26/21 0500 08/27/21 0644 08/29/21 0447  WBC 16.1* 15.7* 22.2*  RBC 3.68* 3.52* 3.88  HGB 10.2* 9.9* 10.8*  HCT 33.0* 31.4* 33.8*  MCV 89.7 89.2 87.1  MCH 27.7 28.1 27.8  MCHC 30.9 31.5 32.0  RDW 14.7 14.8 14.8  PLT 348 353 400   Thyroid No results for input(s): "TSH", "FREET4" in the last 168 hours.  BNPNo results for input(s): "BNP", "PROBNP" in the last 168 hours.  DDimer No results for input(s): "DDIMER" in the last 168 hours.   Radiology    DG Chest Port 1 View  Result Date: 08/29/2021 CLINICAL DATA:  Status post thoracentesis EXAM: PORTABLE CHEST 1 VIEW COMPARISON:  08/28/2021 FINDINGS: Small bilateral  pleural effusions, right greater than left. Bilateral diffuse interstitial thickening. No pneumothorax. Stable cardiomegaly. No acute osseous abnormality. IMPRESSION: 1. Small bilateral pleural effusions, right greater than left. No pneumothorax. Electronically Signed   By: Elige Ko M.D.   On: 08/29/2021 16:54   DG Chest Port 1 View  Result Date: 08/28/2021 CLINICAL DATA:  Shortness of breath and pleural effusion EXAM: PORTABLE CHEST 1 VIEW COMPARISON:  None Available. FINDINGS: Cardiomegaly. Bilateral layering pleural effusions are stable. Increased interstitial markings bilaterally suggesting pulmonary edema. No pneumothorax. No other interval changes. IMPRESSION: Cardiomegaly, left greater than right layering pleural effusions, and suspected pulmonary edema. Electronically Signed   By: Gerome Sam III M.D.   On: 08/28/2021 15:51    Cardiac Studies   Limited echocardiogram completed on 08/25/2021 1. Left ventricular ejection fraction, by estimation, is 60 to 65%. The left ventricle has normal function. The left ventricle has no regional wall motion abnormalities. Left ventricular diastolic parameters are indeterminate.   2. Right ventricular systolic function is mildly reduced. The right ventricular size is mildly enlarged. There is moderately elevated pulmonary artery systolic pressure. The estimated right ventricular systolic pressure is 55.4 mmHg.   3. Moderate pleural effusion in the left lateral region estimated 6 cm.   4. The mitral valve is normal in structure. No evidence of mitral valve regurgitation. No evidence of mitral stenosis.   5. Tricuspid valve regurgitation is moderate.   6. The aortic valve was not well visualized. Aortic valve regurgitation is not visualized. No aortic stenosis is present.   7. The inferior vena cava is dilated in size with >50% respiratory variability, suggesting right atrial pressure of 8 mmHg.   Patient Profile     76 y.o. female with a history of  HFpEF, COPD, obesity, respiratory failure, who presented with shortness of breath and increased peripheral edema, diagnosed with PNA and respiratory failure who is being seen for A-fib RVR, HFpEF and pericardial effusion.   Assessment & Plan    HFpEF - net -2L in the last 24 hours -metolazone 5 mg daily added yesterday -CXR revealed bilateral small pleural effusions -LVEF 60-65%  -continue torsemide, jardiance, and spironolactone -patient continues to be changed back and forth between bipap and heated high flow [place] -continue to titrate FiO2 has tolerated  -supportive care  2. Bilateral pleural effusions -s/p thoracentesis 08/22/2021 yielding -  850 mls, cytology revealed no malignancy -repeat thoracentesis completed on 08/29/2021  -breif discussion was had with patient about pleurex catheter but patient wanted to discuss with family per the notes  -continue torsemide and metolazone as kidneys tolerate  3.Pericardial effusion -trivial pericardial effusion on 6/22/203  4. PAF/ flutter -currently rate controlled -no NSVT episodes noted overnight -continue eliquis 5 mg bid -potassium and Mg stable to labs from yesterday  5. Acute on chronic hypoxic respiratory failure -multifactorial likely related to congestive heart failure, pleural effusions, pericardial effusion, HAP, and COPD -supportive care -continue with care goals discussion with palliative care      For questions or updates, please contact [facility] Please consult [email or web address] for contact info under        Signed, Leandre Wien, NP  08/30/2021, 8:34 AM

## 2021-08-31 ENCOUNTER — Ambulatory Visit: Payer: Medicare Other | Admitting: Family

## 2021-08-31 DIAGNOSIS — E871 Hypo-osmolality and hyponatremia: Secondary | ICD-10-CM | POA: Diagnosis not present

## 2021-08-31 DIAGNOSIS — I5033 Acute on chronic diastolic (congestive) heart failure: Secondary | ICD-10-CM | POA: Diagnosis not present

## 2021-08-31 DIAGNOSIS — J90 Pleural effusion, not elsewhere classified: Secondary | ICD-10-CM | POA: Diagnosis not present

## 2021-08-31 DIAGNOSIS — I480 Paroxysmal atrial fibrillation: Secondary | ICD-10-CM | POA: Diagnosis not present

## 2021-08-31 DIAGNOSIS — N179 Acute kidney failure, unspecified: Secondary | ICD-10-CM | POA: Diagnosis not present

## 2021-08-31 DIAGNOSIS — J9621 Acute and chronic respiratory failure with hypoxia: Secondary | ICD-10-CM | POA: Diagnosis not present

## 2021-08-31 DIAGNOSIS — G4733 Obstructive sleep apnea (adult) (pediatric): Secondary | ICD-10-CM

## 2021-08-31 LAB — COMPREHENSIVE METABOLIC PANEL
ALT: 13 U/L (ref 0–44)
AST: 14 U/L — ABNORMAL LOW (ref 15–41)
Albumin: 2.7 g/dL — ABNORMAL LOW (ref 3.5–5.0)
Alkaline Phosphatase: 72 U/L (ref 38–126)
BUN: 59 mg/dL — ABNORMAL HIGH (ref 8–23)
CO2: >45 mmol/L — ABNORMAL HIGH (ref 22–32)
Calcium: 9.1 mg/dL (ref 8.9–10.3)
Chloride: 75 mmol/L — ABNORMAL LOW (ref 98–111)
Creatinine, Ser: 1.37 mg/dL — ABNORMAL HIGH (ref 0.44–1.00)
GFR, Estimated: 40 mL/min — ABNORMAL LOW (ref >60)
Glucose, Bld: 131 mg/dL — ABNORMAL HIGH (ref 70–99)
Potassium: 3 mmol/L — ABNORMAL LOW (ref 3.5–5.1)
Sodium: 134 mmol/L — ABNORMAL LOW (ref 135–145)
Total Bilirubin: 0.8 mg/dL (ref 0.3–1.2)
Total Protein: 6.8 g/dL (ref 6.5–8.1)

## 2021-08-31 LAB — CBC
HCT: 34.2 % — ABNORMAL LOW (ref 36.0–46.0)
Hemoglobin: 11 g/dL — ABNORMAL LOW (ref 12.0–15.0)
MCH: 27.3 pg (ref 26.0–34.0)
MCHC: 32.2 g/dL (ref 30.0–36.0)
MCV: 84.9 fL (ref 80.0–100.0)
Platelets: 365 10*3/uL (ref 150–400)
RBC: 4.03 MIL/uL (ref 3.87–5.11)
RDW: 14.7 % (ref 11.5–15.5)
WBC: 16.5 10*3/uL — ABNORMAL HIGH (ref 4.0–10.5)
nRBC: 0 % (ref 0.0–0.2)

## 2021-08-31 LAB — GLUCOSE, CAPILLARY
Glucose-Capillary: 149 mg/dL — ABNORMAL HIGH (ref 70–99)
Glucose-Capillary: 354 mg/dL — ABNORMAL HIGH (ref 70–99)
Glucose-Capillary: 345 mg/dL — ABNORMAL HIGH (ref 70–99)
Glucose-Capillary: 86 mg/dL (ref 70–99)
Glucose-Capillary: 127 mg/dL — ABNORMAL HIGH (ref 70–99)

## 2021-08-31 LAB — BLOOD GAS, ARTERIAL
Acid-Base Excess: 24.2 mmol/L — ABNORMAL HIGH (ref 0.0–2.0)
Bicarbonate: 48.4 mmol/L — ABNORMAL HIGH (ref 20.0–28.0)
O2 Content: 10 L/min
O2 Saturation: 98.1 %
Patient temperature: 37
pCO2 arterial: 46 mmHg (ref 32–48)
pH, Arterial: 7.63 (ref 7.35–7.45)
pO2, Arterial: 88 mmHg (ref 83–108)

## 2021-08-31 MED ORDER — POTASSIUM CHLORIDE CRYS ER 20 MEQ PO TBCR
20.0000 meq | EXTENDED_RELEASE_TABLET | Freq: Two times a day (BID) | ORAL | Status: DC
  Administered 2021-08-31 – 2021-09-07 (×15): 20 meq via ORAL
  Filled 2021-08-31 (×15): qty 1

## 2021-08-31 MED ORDER — TORSEMIDE 20 MG PO TABS
40.0000 mg | ORAL_TABLET | Freq: Two times a day (BID) | ORAL | Status: DC
Start: 2021-08-31 — End: 2021-08-31

## 2021-08-31 MED ORDER — LIDOCAINE 5 % EX PTCH
2.0000 | MEDICATED_PATCH | CUTANEOUS | Status: DC
  Administered 2021-08-31 – 2021-09-06 (×7): 2 via TRANSDERMAL
  Filled 2021-08-31 (×7): qty 2

## 2021-08-31 NOTE — Progress Notes (Addendum)
Progress Note  Patient Name: Karen [name] Date of Encounter: 08/31/2021  [facility] Cardiologist: Ida Rogue, MD   Subjective   Currently eating breakfast, feels okay, still short of breath, left thoracenteses performed 2 days ago yielding 1.1 L  Inpatient Medications    Scheduled Meds:  acetaminophen  1,000 mg Oral BID   allopurinol  100 mg Oral Daily   apixaban  5 mg Oral BID   atorvastatin  10 mg Oral Daily   empagliflozin  10 mg Oral Daily   ferrous sulfate  325 mg Oral Q breakfast   fluticasone  2 spray Each Nare Daily   insulin aspart  0-20 Units Subcutaneous TID WC   insulin aspart  10 Units Subcutaneous TID WC   insulin glargine-yfgn  22 Units Subcutaneous BID   lidocaine  1 patch Transdermal Q24H   melatonin  5 mg Oral QHS   pantoprazole  20 mg Oral Daily   potassium chloride  20 mEq Oral BID   spironolactone  25 mg Oral Daily   Continuous Infusions:  PRN Meds: acetaminophen, levalbuterol, mouth rinse, oxyCODONE, senna-docusate, traZODone   Vital Signs    Vitals:   08/31/21 0015 08/31/21 0217 08/31/21 0328 08/31/21 0809  BP:   (!) 105/59 105/68  Pulse:   (!) 101 96  Resp:   20 18  Temp:   98.2 F (36.8 C) 98 F (36.7 C)  TempSrc:   Oral Oral  SpO2:   98% (!) 89%  Weight: 88.7 kg 88.7 kg    Height:        Intake/Output Summary (Last 24 hours) at 08/31/2021 1039 Last data filed at 08/31/2021 0820 Gross per 24 hour  Intake 1080 ml  Output 3600 ml  Net -2520 ml      08/31/2021    2:17 AM 08/31/2021   12:15 AM 08/30/2021    5:00 AM  Last 3 Weights  Weight (lbs) 195 lb 8.8 oz 195 lb 8.8 oz 195 lb 15.8 oz  Weight (kg) 88.7 kg 88.7 kg 88.9 kg      Telemetry    Atrial fibrillation, heart rate 99- Personally Reviewed  ECG     - Personally Reviewed  Physical Exam   GEN: Appears weak and tired Neck: No JVD Cardiac: Irregularly irregular Respiratory: Diminished breath sounds bilaterally, no wheezing GI: Soft, nontender,  non-distended  MS: No edema; No deformity. Neuro:  Nonfocal  Psych: Normal affect   Labs    High Sensitivity Troponin:   Recent Labs  Lab 08/03/21 1324 08/03/21 1549 08/14/21 1518 08/14/21 1653  TROPONINIHS 11 10 16 12      Chemistry Recent Labs  Lab 08/27/21 0644 08/28/21 0447 08/29/21 0447 08/31/21 0538  NA 133*  --  133* 134*  K 3.7  --  4.3 3.0*  CL 81*  --  78* 75*  CO2 40*  --  40* >45*  GLUCOSE 90  --  128* 131*  BUN 41*  --  41* 59*  CREATININE 1.00 1.05* 1.08* 1.37*  CALCIUM 8.9  --  9.1 9.1  MG  --   --  2.1  --   PROT  --   --   --  6.8  ALBUMIN  --   --   --  2.7*  AST  --   --   --  14*  ALT  --   --   --  13  ALKPHOS  --   --   --  72  BILITOT  --   --   --  0.8  GFRNONAA 58* 55* 53* 40*  ANIONGAP 12  --  15 NOT CALCULATED    Lipids No results for input(s): "CHOL", "TRIG", "HDL", "LABVLDL", "LDLCALC", "CHOLHDL" in the last 168 hours.  Hematology Recent Labs  Lab 08/27/21 0644 08/29/21 0447 08/31/21 0538  WBC 15.7* 22.2* 16.5*  RBC 3.52* 3.88 4.03  HGB 9.9* 10.8* 11.0*  HCT 31.4* 33.8* 34.2*  MCV 89.2 87.1 84.9  MCH 28.1 27.8 27.3  MCHC 31.5 32.0 32.2  RDW 14.8 14.8 14.7  PLT 353 400 365   Thyroid No results for input(s): "TSH", "FREET4" in the last 168 hours.  BNP No results for input(s): "BNP", "PROBNP" in the last 168 hours.   DDimer No results for input(s): "DDIMER" in the last 168 hours.   Radiology    DG Chest Port 1 View  Result Date: 08/30/2021 CLINICAL DATA:  Atelectasis. History of hypertension, COPD, heart failure. EXAM: PORTABLE CHEST 1 VIEW COMPARISON:  08/29/2021 FINDINGS: Heart is enlarged. There are bilateral pleural effusions. There is increased opacity at the LEFT lung base, consistent with effusion and atelectasis or infiltrate. There is mild interstitial pulmonary edema. IMPRESSION: 1. Stable cardiomegaly. 2. Bilateral pleural effusions. 3. Increased opacity at the LEFT lung base. Appearance is similar to studies  performed on 08/28/2021. Electronically Signed   By: Nolon Nations M.D.   On: 08/30/2021 15:57   US THORACENTESIS ASP PLEURAL SPACE W/IMG GUIDE  Result Date: 08/30/2021 INDICATION: Pleural effusion EXAM: ULTRASOUND GUIDED LEFT THORACENTESIS MEDICATIONS: None. COMPLICATIONS: None immediate. PROCEDURE: An ultrasound guided thoracentesis was thoroughly discussed with the patient and questions answered. The benefits, risks, alternatives and complications were also discussed. The patient understands and wishes to proceed with the procedure. Written consent was obtained. Ultrasound was performed to localize and mark an adequate pocket of fluid in the left chest. The area was then prepped and draped in the normal sterile fashion. 1% Lidocaine was used for local anesthesia. Under ultrasound guidance a 19 gauge Yueh catheter was introduced. Thoracentesis was performed. The catheter was removed and a dressing applied. FINDINGS: A total of approximately 1,150 mL of clear straw-colored pleural fluid was removed. IMPRESSION: Successful ultrasound guided left thoracentesis yielding 1150 mL of pleural fluid. Electronically Signed   By: Albin Felling M.D.   On: 08/30/2021 12:28   DG Chest Port 1 View  Result Date: 08/29/2021 CLINICAL DATA:  Status post thoracentesis EXAM: PORTABLE CHEST 1 VIEW COMPARISON:  08/28/2021 FINDINGS: Small bilateral pleural effusions, right greater than left. Bilateral diffuse interstitial thickening. No pneumothorax. Stable cardiomegaly. No acute osseous abnormality. IMPRESSION: 1. Small bilateral pleural effusions, right greater than left. No pneumothorax. Electronically Signed   By: Kathreen Devoid M.D.   On: 08/29/2021 16:54    Cardiac Studies   Limited TTE 08/20/2021 1. Left ventricular ejection fraction, by estimation, is 55 to 60%. The  left ventricle has normal function.   2. Right ventricular systolic function is low normal.   3. A small pericardial effusion is present. Large  pleural effusion in the  left lateral region.   4. The aortic valve is calcified.   5. The inferior vena cava is dilated in size with <50% respiratory  variability, suggesting right atrial pressure of 15 mmHg.   Patient Profile     76 y.o. female with history of HFpEF, COPD, obesity, respiratory failure, oxygen presenting with shortness of breath and edema, diagnosed with pneumonia and respiratory failure, requiring pleural effusions  requiring thoracentesis x2 being seen for A-fib RVR and HFpEF.  Assessment & Plan    HFpEF -Appears euvolemic -Net -2.9 L over the past 24 hours -Creatinine worse -hold diuretics with aki -Monitor creatinine.  Continue Aldactone.   2.   Persistent A-fib -Heart rate reasonable -No AV nodal agents due to low normal BP -Eliquis 5 mg twice daily   4.  COPD, respiratory failure, pneumonia, recurrent pleural effusion -S/p left thoracentesis x2 -Will benefit from pleural cath -On high flow nasal oxygen -Management as per primary team, pulmonary medicine   Prognosis remains guarded.   Total encounter time more than 50 minutes  Greater than 50% was spent in counseling and coordination of care with the patient     Signed, Kate Sable, MD  08/31/2021, 10:39 AM

## 2021-08-31 NOTE — Progress Notes (Addendum)
Occupational Therapy Treatment Patient Details Name: Karen [name] MRN: [id number] DOB: 1946-02-26 Today's Date: 08/31/2021   History of present illness Karen [name] is a 34yoF who comes to [facility] on 08/14/21 from [facility] for CP. P tgiven SL nitro prior to arrival. Pt Dx with PNA 2 days prior. PMH of CHF, COPD on 3L/min, sleep apnea, CKDII/III, HLD, DM, former tobacco use, PAF on eliquis, pulmonary HTN.   OT comments  Chart reviewed to date, RN cleared pt for participation in OT tx session. Re-eval completed on this date. Pt alert and oriented x4, agreeable to tx session. Tx session targeted progressing functional mobility and activity tolerance for improved participation in ADL tasks. Bed mobility completed with supervision with HOB raised, STS with CGA with RW, short amb transfer to bedside chair with CGA with RW. Grooming tasks completed in sitting with SET UP, UB dressing with MIN A, LB dressing with MAX A. Pt briefly down to 82% on 10 L via [place] during mobility however ?pleth and pt recovering to above 95% on 10 L via [place] within 30 seconds. No report of SOB. Pt is left in bedside chair, NAD, all needs met. Respiratory present. Discharge recommendation remains appropriate. OT will continue to follow acutely.    Recommendations for follow up therapy are one component of a multi-disciplinary discharge planning process, led by the attending physician.  Recommendations may be updated based on patient status, additional functional criteria and insurance authorization.    Follow Up Recommendations  Skilled nursing-short term rehab (<3 hours/day)    Assistance Recommended at Discharge Frequent or constant Supervision/Assistance  Patient can return home with the following  A lot of help with bathing/dressing/bathroom;A little help with walking and/or transfers;Assistance with cooking/housework;Direct supervision/assist for medications management;Assist for transportation;Help with stairs or ramp for  entrance   Equipment Recommendations  Other (comment) (per next venue of care)    Recommendations for Other Services      Precautions / Restrictions Precautions Precautions: Fall Precaution Comments: watch spO2 Restrictions Weight Bearing Restrictions: No       Mobility Bed Mobility                    Transfers                         Balance                                           ADL either performed or assessed with clinical judgement   ADL Overall ADL's : Needs assistance/impaired                                       General ADL Comments: supine>sit with supervision with HOB raised, STS with CGA with RW, short amb transfer to bedside chair with CGA with RW, UB dressing with MIN A, LB dressing with MAXA, UB bathing with SET UP, washing hair with MIN A, grooming tasks with SET UP in seated.    Extremity/Trunk Assessment              Vision       Perception     Praxis      Cognition Arousal/Alertness: Awake/alert Behavior During Therapy: WFL for tasks assessed/performed Overall Cognitive Status: Within Functional  Limits for tasks assessed                                          Exercises      Shoulder Instructions       General Comments Pt on 10L via [place], spo2 down to 82% with mobility however ?pleth, up to >95% seated within 30 seconds; spo2 >95% on 10 L via  throughout ADL tasks    Pertinent Vitals/ Pain       Pain Assessment Pain Assessment: 0-10 Pain Score: 7  Pain Location: back pain Pain Descriptors / Indicators: Aching Pain Intervention(s): Limited activity within patient's tolerance, Monitored during session, Repositioned, Patient requesting pain meds-RN notified  Home Living                                          Prior Functioning/Environment              Frequency  Min 2X/week        Progress Toward Goals  OT Goals(current  goals can now be found in the care plan section)  Progress towards OT goals: Progressing toward goals     Plan Discharge plan remains appropriate;Frequency remains appropriate    Co-evaluation                 AM-PAC OT "6 Clicks" Daily Activity     Outcome Measure   Help from another person eating meals?: None Help from another person taking care of personal grooming?: A Little Help from another person toileting, which includes using toliet, bedpan, or urinal?: A Little Help from another person bathing (including washing, rinsing, drying)?: A Lot Help from another person to put on and taking off regular upper body clothing?: A Little Help from another person to put on and taking off regular lower body clothing?: A Lot 6 Click Score: 17    End of Session Equipment Utilized During Treatment: Oxygen;Rolling walker (2 wheels)  OT Visit Diagnosis: Unsteadiness on feet (R26.81);Muscle weakness (generalized) (M62.81)   Activity Tolerance Patient tolerated treatment well   Patient Left in chair;with call bell/phone within reach;with chair alarm set   Nurse Communication Mobility status        Time: 1343-1415 OT Time Calculation (min): 32 min  Charges: OT General Charges $OT Visit: 1 Visit OT Evaluation $OT Re-eval: 1 Re-eval OT Treatments $Self Care/Home Management : 23-37 mins  Shanon Payor, OTD OTR/L  08/31/21, 2:36 PM

## 2021-08-31 NOTE — Progress Notes (Signed)
[facility] Progress Note  Patient: Karen [name]    JSE:[id number]  DOA: 08/14/2021    Date of Service: the patient was seen and examined on 08/31/2021  Brief hospital course: 76 year old female with past medical history of diastolic heart failure, stage IV chronic kidney disease, COPD, diabetes mellitus and hypertension with recent hospitalization discharged to skilled nursing on 6/7 who presented back to emergency room on 6/11 for left-sided chest pain that had started 2 days prior.  At time of admission, patient treated for sepsis secondary to pneumonia and placed on broad-spectrum antibiotics as well as found to have atrial fibrillation with rapid ventricular rate and acute on chronic diastolic heart failure.  Patient since admission has needed high flow oxygen/BiPAP.  Since admission, patient has required numerous thoracenteses.  She required BiPAP at times, but then has refused, leading to episodes of decompensation.  Over the course of the past week, patient's oxygenation has steadily improved going from continuous BiPAP to intermittent BiPAP with high flow nasal cannula from 15 L currently down to 9 L.  Palliative care has been involved.  Assessment and Plan: Assessment and Plan: * Acute on chronic respiratory failure with hypoxia ([place]) Patient on 3L O2 at home. ON presentation she was mildly hypoxemic 2/2 CHF and PNA, with recurrent pleural effusions and overall decline, has required BiPAP and high flow oxygen.  She is actually had some improvement over the past few days going from 15 L currently to 9 L.  High risk of decompensation.  In part due to noncompliance with BiPAP  Acute kidney injury superimposed on CKD ([place]) Patient's last Cr 1.01 08/06/21 now 1.81 2/2 multiple co-morbidities and sepsis.  Plan Gentle hydration  Control of sepsis, PNA, CHF  F/u Bmet  Pulmonary hypertension, unspecified ([place]) Contributing to pulmonary issues  Acute on chronic heart failure with  preserved ejection fraction (HFpEF) ([place]) Last Echo 08/04/21 revealing grade III DD, tricuspid regurg. She now presents with pulmonary edema, increased SOB.  Has diuresed over 17 L and is almost -14 L deficient.  Her weight has dropped approximately 10 pounds   COPD (chronic obstructive pulmonary disease) ([place]) Continue home regimen  Sepsis due to pneumonia (HCC)-resolved as of 08/31/2021 Resolved.  Met criteria for sepsis on admission given tachypnea, tachycardia and pulmonary source.  Sepsis stabilized and completed antibiotic course.  HAP (hospital-acquired pneumonia) To hospital this admission w/ sepsis - resolved Treatment completed  OSA (obstructive sleep apnea) Will use CPAP  Permanent atrial fibrillation (HCC) Continue Eliquis.  For the most part rate controlled and as her oxygenation improves, heart rate should come down  Uncontrolled type 2 diabetes mellitus with hyperglycemia, with long-term current use of insulin ([place]) Patient with very high glucose. She is on basal insulin, dapaglifozin. SSI  Hyponatremia Chronic recurring problem.  Monitor BMP  Hypokalemia Chronic recurring problem. Diuretics a contributing cause.  Replace as needed  Gastroesophageal reflux disease without esophagitis Continue PPI tx       Body mass index is 34.64 kg/m.        Consultants: Pulmonary Cardiology Palliative care  Procedures: Echocardiogram done 6/17 noting preserved ejection fraction  Antimicrobials: Completed antibiotic course for sepsis/HCAP  Code Status: DNR   Subjective: Patient complains of lower back pain, breathing a little easier  Objective: Vital signs were reviewed and unremarkable. Vitals:   08/31/21 1451 08/31/21 1545  BP: (!) 116/57   Pulse: 92   Resp: (!) 25   Temp: 98 F (36.7 C)   SpO2: 93% 95%  Intake/Output Summary (Last 24 hours) at 08/31/2021 1746 Last data filed at 08/31/2021 1452 Gross per 24 hour  Intake 480 ml  Output 2450 ml   Net -1970 ml   Filed Weights   08/30/21 0500 08/31/21 0015 08/31/21 0217  Weight: 88.9 kg 88.7 kg 88.7 kg   Body mass index is 34.64 kg/m.  Exam:  General: Alert and oriented x3, no acute distress HEENT: Normocephalic and atraumatic, mucous membranes are moist Cardiovascular: Irregular rhythm, borderline tachycardia Respiratory: Decreased breath sounds throughout Abdomen: Soft, nontender, nondistended, positive bowel sounds Musculoskeletal: No clubbing or cyanosis or edema Skin: No skin breaks, tears or lesions Psychiatry: Appropriate, no evidence of psychoses Neurology: No focal deficits  Data Reviewed: Creatinine up to 1.37 Potassium of 3.0  Disposition:  Status is: Inpatient Remains inpatient appropriate because: Further improvement in hypoxia    Anticipated discharge date: 7/3  Family Communication: Husband at bedside DVT Prophylaxis: Place and maintain sequential compression device Start: 08/17/21 1311 apixaban (ELIQUIS) tablet 5 mg    Author: Annita Brod ,MD 08/31/2021 5:46 PM  To reach On-call, see care teams to locate the attending and reach out via [email or web address]. Between 7PM-7AM, please contact night-coverage If you still have difficulty reaching the attending provider, please page the [facility] (Director on Call) for [facility] on amion for assistance.

## 2021-08-31 NOTE — Progress Notes (Signed)
PULMONOLOGY         Date: 08/31/2021,   MRN# [id number] Karen [name] March 12, 1945     AdmissionWeight: 93.5 kg                 CurrentWeight: 88.7 kg  Referring provider: Dr. Sheppard Coil   CHIEF COMPLAINT:   Recurrent pleural effusion with acute on chronic hypoxemic respiratory failure   HISTORY OF PRESENT ILLNESS   This is a pleasant 76 year old female with heart failure with preserved EF at 50%, recurrent hypoxemia, CKD, advanced COPD, diabetes, peripheral edema, GI bleeding history of dyslipidemia essential hypertension iron deficiency anemia valvular heart disease history of GI bleeding who came in with chest discomfort x2 days and has been hospitalized already for 14 days initial admission 08/14/2021.  Initially diagnosed with pneumonia status post full scope of therapy for community-acquired pneumonia.  She generally wears about 3 L/min nasal cannula.  On arrival she received IV resuscitative fluids and empiric cefepime and vancomycin noted to have accelerated atrial fibrillation treated with IV diltiazem with mild improvement however development of bilateral large pleural effusion status postthoracentesis.  Despite all this she continues to deteriorate and has had palliative care evaluation with CODE STATUS advance to DNR.  Today during discussion she wishes to continue full scope of therapy at this moment is on noninvasive ventilator with BiPAP.  She had 850 cc straw-colored fluid removed via left thoracentesis 08/16/2021 and same 1 week later CT chest independently reviewed by me with bibasilar atelectasis and compressive atelectasis of the left lung with surrounding effusion as well as pericardial effusion. PCCM consultation for further evaluation and management   08/30/21 - patient remains hypoxemic. Slight improvement on HFNC currently on 15L/min 75% spO2>90%.  S/p Therapeutic [place] >1.1L drained. Repeat CXR today for interval changes post Metaneb recruitment. Remains on  aggressive diuretic regimen with Metolazone, aldactone, torsemide currently 24L net negative. Today further reduced non essential nephrotoxins by reducing protonix to 20mg  po daily, due to AF have dcd Albuterol and Duoneb and Anoro ellipta inhaler and will use xopenex only q8h PRN for now.  Amiodarone has been dcd appreciate cardiology input. She should try to use BIPAP more instead of HFNC due to hypercapnia which is likely to get worse in context of contraction alkalosis. Diet modified to Renal fluid restricted 1200cc/24h.     08/31/21- patient weaned to 12L [place] from 75% 15L/min HFNC.  25L net negative. Devt of mild AKI, have reduced diuretics to aldactone only due to hypokalemia. Plan to continue metaneb, PT/OT. Minimized meds for renal sparing. Overall improved markedly over past 48h.  Reviewed plan with RT for recruitment due to persistent atelectatic changes.  She is sitting up in chair and appears more comfortable but still very weak and frail.   PAST MEDICAL HISTORY   Past Medical History:  Diagnosis Date   (HFpEF) heart failure with preserved ejection fraction ([place])    a. 2017 Echo: EF 50%; b. 06/2018 Echo: EF 50-55%; c. 08/2018 Echo: EF 50-55%, Nl RV fxn; d. 09/2019 Echo: EF 55-60%, no rwma, mild LVH, Gr1 DD, nl RV size/fxn, PASP 63.81mmHg. Mildly dil LA. Triv MR. Mod AS (AoV 0.94cm^2 VTI; mean grad 17.71mmHg); d. 02/2021 Echo: EF 60-65%, no rwma, GrI DD, mildly red RV fxn, RVSP 64.84mmHg, mild-mod MR, mod-sev TR, mild AS.   Acute on chronic respiratory failure with hypoxia and hypercapnia (HCC) 01/07/2015   Anemia    Asterixis 01/07/2015   Asthma    Cataract  CKD (chronic kidney disease), stage III (HCC)    COPD (chronic obstructive pulmonary disease) ([place])    a. 06/2018 tobacco use, home 3L oxygen    Diabetes mellitus without complication ([place])    a. 09/2019 A1C 8.8   Edema, peripheral 04/20/2014   GI bleed 06/28/2019   History of kidney stones    Hyperlipidemia    Hypertension    Iron  deficiency anemia 06/22/2014   Junctional bradycardia    a. In setting of beta blocker therapy.   Leucocytosis 10/19/2015   Moderate aortic stenosis    a.  09/2019 Echo: Mod AS (AoV 0.94cm^2 VTI; mean grad 17.58mmHg); b. 02/2021 Echo: Mild AS.   Morbid obesity ([place])    Overactive bladder    Primary osteoarthritis of right knee 09/01/2016   Sciatica 01/07/2015   Valvular heart disease    a. 02/2021 Echo: EF 60-65%, no rwma, GrI DD, mild-mod MR, mod-sev TR, mild AS     SURGICAL HISTORY   Past Surgical History:  Procedure Laterality Date   APPENDECTOMY     CESAREAN SECTION     x3   CHOLECYSTECTOMY     COLONOSCOPY WITH PROPOFOL N/A 08/28/2017   Procedure: COLONOSCOPY WITH PROPOFOL;  Surgeon: Lucilla Lame, MD;  Location: [facility] ENDOSCOPY;  Service: Endoscopy;  Laterality: N/A;   COLONOSCOPY WITH PROPOFOL N/A 08/29/2017   Procedure: COLONOSCOPY WITH PROPOFOL;  Surgeon: Lucilla Lame, MD;  Location: [facility] ENDOSCOPY;  Service: Endoscopy;  Laterality: N/A;   CYSTOSCOPY W/ URETERAL STENT PLACEMENT Right 09/15/2017   Procedure: CYSTOSCOPY WITH RETROGRADE PYELOGRAM/URETERAL STENT PLACEMENT;  Surgeon: Cleon Gustin, MD;  Location: [facility] ORS;  Service: Urology;  Laterality: Right;   CYSTOSCOPY/URETEROSCOPY/HOLMIUM LASER/STENT PLACEMENT Right 10/09/2017   Procedure: CYSTOSCOPY/URETEROSCOPY/HOLMIUM LASER/STENT PLACEMENT;  Surgeon: Abbie Sons, MD;  Location: [facility] ORS;  Service: Urology;  Laterality: Right;  right Stent exchange   ESOPHAGOGASTRODUODENOSCOPY (EGD) WITH PROPOFOL N/A 09/16/2018   Procedure: ESOPHAGOGASTRODUODENOSCOPY (EGD) WITH PROPOFOL;  Surgeon: Lin Landsman, MD;  Location: [place];  Service: Gastroenterology;  Laterality: N/A;   EYE SURGERY     IR THORACENTESIS ASP PLEURAL SPACE W/IMG GUIDE  02/07/2021     FAMILY HISTORY   Family History  Problem Relation Age of Onset   Other Mother        unknown medical history   Other Father        unknown medical history      SOCIAL HISTORY   Social History   Tobacco Use   Smoking status: Former    Packs/day: 1.00    Years: 20.00    Total pack years: 20.00    Types: Cigarettes    Quit date: 12/04/1992    Years since quitting: 28.7   Smokeless tobacco: Never  Vaping Use   Vaping Use: Never used  Substance Use Topics   Alcohol use: No   Drug use: No     MEDICATIONS    Home Medication:    Current Medication:  Current Facility-Administered Medications:    acetaminophen (TYLENOL) tablet 1,000 mg, 1,000 mg, Oral, BID, Totiana Everson, MD, 1,000 mg at 08/30/21 2145   acetaminophen (TYLENOL) tablet 500-1,000 mg, 500-1,000 mg, Oral, Q6H PRN, Norins, Heinz Knuckles, MD, 1,000 mg at 08/29/21 1203   allopurinol (ZYLOPRIM) tablet 100 mg, 100 mg, Oral, Daily, Norins, Heinz Knuckles, MD, 100 mg at 08/30/21 0174   apixaban (ELIQUIS) tablet 5 mg, 5 mg, Oral, BID, Agbor-Etang, Brian, MD, 5 mg at 08/30/21 2145   atorvastatin (LIPITOR) tablet 10 mg,  10 mg, Oral, Daily, Norins, Heinz Knuckles, MD, 10 mg at 08/30/21 1761   empagliflozin (JARDIANCE) tablet 10 mg, 10 mg, Oral, Daily, Norins, Heinz Knuckles, MD, 10 mg at 08/30/21 6073   ferrous sulfate tablet 325 mg, 325 mg, Oral, Q breakfast, Norins, Heinz Knuckles, MD, 325 mg at 08/30/21 0922   fluticasone (FLONASE) 50 MCG/ACT nasal spray 2 spray, 2 spray, Each Nare, Daily, Norins, Heinz Knuckles, MD, 2 spray at 08/28/21 0824   insulin aspart (novoLOG) injection 0-20 Units, 0-20 Units, Subcutaneous, TID WC, Norins, Heinz Knuckles, MD, 7 Units at 08/30/21 1710   insulin aspart (novoLOG) injection 10 Units, 10 Units, Subcutaneous, TID WC, Adhikari, Amrit, MD, 10 Units at 08/30/21 1710   insulin glargine-yfgn (SEMGLEE) injection 22 Units, 22 Units, Subcutaneous, BID, Sharion Settler, NP, 22 Units at 08/30/21 2146   levalbuterol (XOPENEX) nebulizer solution 0.63 mg, 0.63 mg, Nebulization, Q8H PRN, Lanney Gins, Davisha Linthicum, MD   lidocaine (LIDODERM) 5 % 1 patch, 1 patch, Transdermal, Q24H, Foust, Katy L, NP, 1  patch at 08/30/21 2146   melatonin tablet 5 mg, 5 mg, Oral, QHS, Norins, Heinz Knuckles, MD, 5 mg at 08/30/21 2145   metolazone (ZAROXOLYN) tablet 5 mg, 5 mg, Oral, Daily, Ottie Glazier, MD, 5 mg at 08/30/21 7106   Oral care mouth rinse, 15 mL, Mouth Rinse, PRN, Emeterio Reeve, DO   oxyCODONE (Oxy IR/ROXICODONE) immediate release tablet 5 mg, 5 mg, Oral, Q6H PRN, Wyvonnia Dusky, MD, 5 mg at 08/31/21 0405   pantoprazole (PROTONIX) EC tablet 20 mg, 20 mg, Oral, Daily, Lanney Gins, Eugina Row, MD   potassium chloride SA (KLOR-CON M) CR tablet 20 mEq, 20 mEq, Oral, BID, Agbor-Etang, Aaron Edelman, MD   senna-docusate (Senokot-S) tablet 1 tablet, 1 tablet, Oral, BID BM PRN, Norins, Heinz Knuckles, MD, 1 tablet at 08/18/21 2694   spironolactone (ALDACTONE) tablet 25 mg, 25 mg, Oral, Daily, Ottie Glazier, MD, 25 mg at 08/30/21 8546   torsemide (DEMADEX) tablet 40 mg, 40 mg, Oral, BID, Agbor-Etang, Aaron Edelman, MD   traZODone (DESYREL) tablet 50 mg, 50 mg, Oral, QHS PRN, Norins, Heinz Knuckles, MD, 50 mg at 08/25/21 2213    ALLERGIES   Ace inhibitors, Beta adrenergic blockers, Gabapentin, Lisinopril, Lyrica [pregabalin], and Shrimp [shellfish allergy]     REVIEW OF SYSTEMS    Review of Systems:  Gen:  Denies  fever, sweats, chills weigh loss  HEENT: Denies blurred vision, double vision, ear pain, eye pain, hearing loss, nose bleeds, sore throat Cardiac:  No dizziness, chest pain or heaviness, chest tightness,edema Resp:   reports dyspnea chronically  Gi: Denies swallowing difficulty, stomach pain, nausea or vomiting, diarrhea, constipation, bowel incontinence Gu:  Denies bladder incontinence, burning urine Ext:   Denies Joint pain, stiffness or swelling Skin: Denies  skin rash, easy bruising or bleeding or hives Endoc:  Denies polyuria, polydipsia , polyphagia or weight change Psych:   Denies depression, insomnia or hallucinations   Other:  All other systems negative   VS: BP (!) 105/59 (BP Location: Left  Arm)   Pulse (!) 101   Temp 98.2 F (36.8 C) (Oral)   Resp 20   Ht 5\' 3"  (1.6 m)   Wt 88.7 kg   SpO2 98%   BMI 34.64 kg/m      PHYSICAL EXAM    GENERAL:NAD, no fevers, chills, no weakness no fatigue HEAD: Normocephalic, atraumatic.  EYES: Pupils equal, round, reactive to light. Extraocular muscles intact. No scleral icterus.  MOUTH: Moist mucosal membrane. Dentition intact. No abscess noted.  EAR, NOSE, THROAT: Clear without exudates. No external lesions.  NECK: Supple. No thyromegaly. No nodules. No JVD.  PULMONARY: decreased breath sounds with mild rhonchi worse at bases bilaterally.  CARDIOVASCULAR: S1 and S2. Regular rate and rhythm. No murmurs, rubs, or gallops. No edema. Pedal pulses 2+ bilaterally.  GASTROINTESTINAL: Soft, nontender, nondistended. No masses. Positive bowel sounds. No hepatosplenomegaly.  MUSCULOSKELETAL: No swelling, clubbing, or edema. Range of motion full in all extremities.  NEUROLOGIC: Cranial nerves II through XII are intact. No gross focal neurological deficits. Sensation intact. Reflexes intact.  SKIN: No ulceration, lesions, rashes, or cyanosis. Skin warm and dry. Turgor intact.  PSYCHIATRIC: Mood, affect within normal limits. The patient is awake, alert and oriented x 3. Insight, judgment intact.       IMAGING     ASSESSMENT/PLAN   Acute on chronic hypoxemic respiratory failure -This is secondary to compressive atelectasis from surrounding moderate pleural effusions bilaterally status post repeat thoracentesis. -Culprit of underlying effusions seems to be CKD with CHF status post IV fluid for initial treatment with presumed sepsis.  -Patient is currently on multiple diuretics with relative hypotension precluding from making adequate perfusion and diuresis. -We discussed Pleurx catheter momentarily and she wishes for additional discussion with her family as well as review of cost and management. -I have changed some of her medication we  discussed reducing narcotics and other nonessential nephrotoxins for now. -Due to relative hypotension will start amiodarone and hold Cardizem to allow [place] to diurese her a little better -She will need recruitment maneuvers with MetaNeb device to help wean down oxygen -decreased diuresis due to AKI -reviewed ABG with metabolic alkalosis due to contraction     Bilateral compressive atelectasis Chest physiotherapy with recruitment maneuvers utilizing MetaNeb            Thank you for allowing me to participate in the care of this patient.   Patient/Family are satisfied with care plan and all questions have been answered.    Provider disclosure: Patient with at least one acute or chronic illness or injury that poses a threat to life or bodily function and is being managed actively during this encounter.  All of the below services have been performed independently by signing provider:  review of prior documentation from internal and or external health records.  Review of previous and current lab results.  Interview and comprehensive assessment during patient visit today. Review of current and previous chest radiographs/CT scans. Discussion of management and test interpretation with health care team and patient/family.   This document was prepared using Dragon voice recognition software and may include unintentional dictation errors.     Ottie Glazier, M.D.  Division of Pulmonary & Critical Care Medicine

## 2021-08-31 NOTE — Assessment & Plan Note (Signed)
Continue Eliquis.  For the most part rate controlled and as her oxygenation improves, heart rate should come down

## 2021-08-31 NOTE — Progress Notes (Addendum)
Patient blood sugar 345, did not eat enough breakfast to give insulin 10 unit meal coverage per order parameters.. Blood sugar at lunchtime was 354, spoke with diabetes coordinator and Dr. [place] Pink, ok'd to give 30 units per sliding scale and meal coverage per order. Patient ate 100% of her lunch.

## 2021-08-31 NOTE — Progress Notes (Addendum)
Pt blood sugar at 86 at 2040. Pt given 1 cup orange juice and snacks now sugar at 127. Pt has a scheduled semglee 22 units at 2200.  NP Foust made aware. Will continue to monitor.  amdea ware  Update 2258: NP Foust ordered to give semglee at this time and recheck at 0300. Incoming shift notified.Will continue to monitor.

## 2021-08-31 NOTE — Hospital Course (Addendum)
76 year old female with past medical history of diastolic heart failure, stage IV chronic kidney disease, COPD, diabetes mellitus and hypertension with recent hospitalization discharged to skilled nursing on 6/7 who presented back to emergency room on 6/11 for left-sided chest pain that had started 2 days prior.  At time of admission, patient treated for sepsis secondary to pneumonia and placed on broad-spectrum antibiotics as well as found to have atrial fibrillation with rapid ventricular rate and acute on chronic diastolic heart failure.  Patient since admission has needed high flow oxygen/BiPAP.  Since admission, patient has required numerous thoracenteses.  She required BiPAP at times, but then has refused, leading to episodes of decompensation.  Over the course of the past week, patient's oxygenation has steadily improved going from continuous BiPAP to intermittent BiPAP with high flow nasal cannula from 15 L currently down to 5 L.  Palliative care has been involved.

## 2021-08-31 NOTE — Progress Notes (Signed)
PT Cancellation Note  Patient Details Name: Karen [name] MRN: [id number] DOB: [birth year]   Cancelled Treatment:     PT attempt. Pt politely refused. " I just got back into bed. Can we do it tomorrow? " Pt is requesting pain meds and RN staff was made aware. PT will continue to follow and progress as able per current POC.    Willette Pa 08/31/2021, 4:17 PM

## 2021-08-31 NOTE — TOC Progression Note (Signed)
Transition of Care [facility]) - Progression Note    Patient Details  Name: Karen [name] MRN: [id number] Date of Birth: Feb 19, 1946  Transition [facility]) CM/SW Contact  Laurena Slimmer, RN Phone Number: 08/31/2021, 1:10 PM  Clinical Narrative:    11:56 Update sent to MD regarding LTACH admission criteria.  Spoke with Jake Bathe RRT, RCP  from [facility] in reference to patient update and criteria for admissions. MD advised patient may meet criteria but would require insurance auth. Patient 's code status may be potential barrier for LTACH auth.   1:00pm  Spoke with patient to get preference of LTACH facility. Patient spouse educated about LTACH's, potential benefit to the patient, and location. Patient spouse does not wish for patient to be moved. He stated he felt she was better off at this facility. Patient spouse request to speak with MD. MD notified.         Expected Discharge Plan and Services                                                 Social Determinants of Health (SDOH) Interventions    Readmission Risk Interventions    10/30/2020    4:17 PM 06/21/2020    3:03 PM 09/30/2019   11:16 AM  Readmission Risk Prevention Plan  Transportation Screening Complete Complete Complete  PCP or Specialist Appt within 3-5 Days   Complete  Social Work Consult for [place] Planning/Counseling   Complete  Palliative Care Screening   Not Applicable  Medication Review Press photographer) Complete Complete Complete  PCP or Specialist appointment within 3-5 days of discharge Complete Complete   HRI or Home Care Consult Patient refused Complete   SW Recovery Care/Counseling Consult Complete Complete   Palliative Care Screening Not Applicable Not [place] Not Applicable Not Applicable

## 2021-08-31 NOTE — Assessment & Plan Note (Signed)
Contributing to pulmonary issues

## 2021-09-01 DIAGNOSIS — Z7189 Other specified counseling: Secondary | ICD-10-CM | POA: Diagnosis not present

## 2021-09-01 DIAGNOSIS — I480 Paroxysmal atrial fibrillation: Secondary | ICD-10-CM | POA: Diagnosis not present

## 2021-09-01 DIAGNOSIS — I5033 Acute on chronic diastolic (congestive) heart failure: Secondary | ICD-10-CM | POA: Diagnosis not present

## 2021-09-01 DIAGNOSIS — N179 Acute kidney failure, unspecified: Secondary | ICD-10-CM | POA: Diagnosis not present

## 2021-09-01 DIAGNOSIS — J9621 Acute and chronic respiratory failure with hypoxia: Secondary | ICD-10-CM | POA: Diagnosis not present

## 2021-09-01 DIAGNOSIS — I4821 Permanent atrial fibrillation: Secondary | ICD-10-CM | POA: Diagnosis not present

## 2021-09-01 LAB — CBC
HCT: 33.7 % — ABNORMAL LOW (ref 36.0–46.0)
Hemoglobin: 10.8 g/dL — ABNORMAL LOW (ref 12.0–15.0)
MCH: 27.3 pg (ref 26.0–34.0)
MCHC: 32 g/dL (ref 30.0–36.0)
MCV: 85.3 fL (ref 80.0–100.0)
Platelets: 391 10*3/uL (ref 150–400)
RBC: 3.95 MIL/uL (ref 3.87–5.11)
RDW: 14.6 % (ref 11.5–15.5)
WBC: 16.3 10*3/uL — ABNORMAL HIGH (ref 4.0–10.5)
nRBC: 0 % (ref 0.0–0.2)

## 2021-09-01 LAB — GLUCOSE, CAPILLARY
Glucose-Capillary: 127 mg/dL — ABNORMAL HIGH (ref 70–99)
Glucose-Capillary: 143 mg/dL — ABNORMAL HIGH (ref 70–99)
Glucose-Capillary: 363 mg/dL — ABNORMAL HIGH (ref 70–99)
Glucose-Capillary: 249 mg/dL — ABNORMAL HIGH (ref 70–99)
Glucose-Capillary: 278 mg/dL — ABNORMAL HIGH (ref 70–99)

## 2021-09-01 LAB — BASIC METABOLIC PANEL
Anion gap: 13 (ref 5–15)
BUN: 63 mg/dL — ABNORMAL HIGH (ref 8–23)
CO2: 42 mmol/L — ABNORMAL HIGH (ref 22–32)
Calcium: 8.9 mg/dL (ref 8.9–10.3)
Chloride: 77 mmol/L — ABNORMAL LOW (ref 98–111)
Creatinine, Ser: 1.3 mg/dL — ABNORMAL HIGH (ref 0.44–1.00)
GFR, Estimated: 42 mL/min — ABNORMAL LOW (ref >60)
Glucose, Bld: 134 mg/dL — ABNORMAL HIGH (ref 70–99)
Potassium: 3.5 mmol/L (ref 3.5–5.1)
Sodium: 132 mmol/L — ABNORMAL LOW (ref 135–145)

## 2021-09-01 NOTE — Progress Notes (Signed)
Daily Progress Note   Patient Name: Karen [name]       Date: 09/01/2021 DOB: [birth year]  Age: 76 y.o. MRN#: [id number] Attending Physician: Annita Brod, MD Primary Care Physician: Margarita Rana, MD Admit Date: 08/14/2021  Reason for Consultation/Follow-up: Establishing goals of care  Subjective: Notes and labs reviewed. In to see patient. She is improving and currently on 7 lpm of O2. Husband is at bedside. He discusses updates and prognosis. We discuss a pleurex vs no pleurex and recurrent thoracentesis. We discuss some of the risks and benefits, advantages and disadvantages and they discuss finances. We discussed hospice at home. At this time, patient and husband would like to go to SNF. They will consider their thoughts on pleurex in the future. Recommend outpatient palliative with transition to hospice when ready.   Length of Stay: 18  Current Medications: Scheduled Meds:   acetaminophen  1,000 mg Oral BID   allopurinol  100 mg Oral Daily   apixaban  5 mg Oral BID   atorvastatin  10 mg Oral Daily   empagliflozin  10 mg Oral Daily   ferrous sulfate  325 mg Oral Q breakfast   fluticasone  2 spray Each Nare Daily   insulin aspart  0-20 Units Subcutaneous TID WC   insulin aspart  10 Units Subcutaneous TID WC   insulin glargine-yfgn  22 Units Subcutaneous BID   lidocaine  2 patch Transdermal Q24H   melatonin  5 mg Oral QHS   pantoprazole  20 mg Oral Daily   potassium chloride  20 mEq Oral BID   spironolactone  25 mg Oral Daily    Continuous Infusions:   PRN Meds: acetaminophen, levalbuterol, mouth rinse, oxyCODONE, senna-docusate, traZODone  Physical Exam Pulmonary:     Effort: Pulmonary effort is normal.  Neurological:     Mental Status: She is alert.              Vital Signs: BP (!) 103/58 (BP Location: Right Arm)   Pulse (!) 104   Temp 98.1 F (36.7 C) (Oral)   Resp 20   Ht 5\' 3"  (1.6 m)   Wt 90.3 kg   SpO2 97%   BMI 35.26 kg/m  SpO2: SpO2: 97 % O2 Device: O2 Device: High Flow Nasal Cannula O2 Flow Rate: O2 Flow Rate (  L/min): 8 L/min  Intake/output summary:  Intake/Output Summary (Last 24 hours) at 09/01/2021 1417 Last data filed at 09/01/2021 0542 Gross per 24 hour  Intake 118 ml  Output 2050 ml  Net -1932 ml   LBM: Last BM Date : 08/30/21 Baseline Weight: Weight: 93.5 kg Most recent weight: Weight: 90.3 kg       Palliative Assessment/Data: 40-50%      Patient Active Problem List   Diagnosis Date Noted   Pericardial effusion    Permanent atrial fibrillation (HCC)    HAP (hospital-acquired pneumonia) 08/14/2021   Pulmonary hypertension, unspecified ([place]) 05/29/2021   OSA (obstructive sleep apnea) 05/29/2021   Elevated troponin 05/28/2021   Lower abdominal pain 05/28/2021   Nausea and vomiting 05/28/2021   Prolonged QT interval 05/28/2021   At risk for fall due to comorbid condition 05/02/2021   Pleural effusion    Acute kidney injury superimposed on CKD ([place])    Impaired gait and mobility 12/20/2020   Pulmonary nodules/lesions, multiple 10/29/2020   Anemia in chronic kidney disease 10/29/2020   Aortic stenosis, moderate    AF (paroxysmal atrial fibrillation) (HCC)    Gastroesophageal reflux disease without esophagitis    Chronic respiratory failure with hypercapnia ([place]) 05/21/2020   Severe sepsis ([place]) 05/21/2020   UTI (urinary tract infection) 05/21/2020   Hyperglycemia due to type 2 diabetes mellitus ([place]) 05/21/2020   Abnormal CT of liver 05/21/2020   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 05/21/2020   Morbid obesity ([place]) 05/08/2020   Athscl heart disease 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 03/06/2020   Acute hip pain, left 10/23/2019   Lumbar stenosis with neurogenic claudication 10/23/2019    COPD with acute exacerbation ([place]) 74/94/4967   Acute diastolic CHF (congestive heart failure) ([place]) 09/28/2019   (HFpEF) heart failure with preserved ejection fraction ([place]) 09/27/2019   Hypokalemia 06/28/2019   Hyponatremia 06/28/2019   Uncontrolled type 2 diabetes mellitus with hyperglycemia, with long-term current use of insulin ([place]) 06/28/2019   CKD (chronic kidney disease), stage IIIa 06/28/2019   Atrial fibrillation, chronic ([place]) 06/28/2019   Pulmonary edema 02/27/2019   Bradycardia 02/24/2019   Acute on chronic diastolic CHF (congestive heart failure) ([place]) 02/23/2019   Acute on chronic respiratory failure with hypoxia ([place]) 02/23/2019   Osteopenia of neck of left femur 11/20/2018   AVM (arteriovenous malformation) of small bowel, acquired    Acute gastric ulcer with hemorrhage    Chronic diastolic heart failure ([place]) 08/22/2018   Diarrhea 08/22/2018   Junctional bradycardia    Acute on chronic heart failure with preserved ejection fraction (HFpEF) ([place])    AKI (acute kidney injury) ([place])    Symptomatic bradycardia 08/05/2018   Acute on chronic respiratory failure ([place]) 06/25/2018   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 diabetes mellitus ([place]) 01/25/2018   History of non anemic vitamin B12 deficiency 01/25/2018   Personal history of kidney stones 11/12/2017   Urge incontinence 11/12/2017   History of leukocytosis 09/17/2017   GI bleed 08/26/2017   Arthritis 08/10/2017   Stage 4 chronic kidney disease ([place]) 08/10/2017   COPD (chronic obstructive pulmonary disease) ([place]) 08/10/2017   Diabetes mellitus type 2, uncomplicated ([place]) 59/16/3846   Hypertension 08/10/2017   Obesity (BMI 35.0-39.9 without comorbidity) 04/11/2017   Primary osteoarthritis of right knee 09/01/2016   Leucocytosis 10/19/2015   Asterixis 01/07/2015   Acute on chronic respiratory failure with hypoxia and hypercapnia ([place]) 01/07/2015   Sciatica 01/07/2015   Weakness 01/07/2015  Chronic 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 01/04/2015   Iron deficiency anemia 06/22/2014   Microalbuminuria 06/22/2014   CHF (congestive heart failure) ([place]) 04/20/2014   Edema, peripheral 04/20/2014    Palliative Care Assessment & Plan    Recommendations/Plan: Recommend palliative to follow with transition to hospice when ready.   Code Status:    Code Status Orders  (From admission, onward)           Start     Ordered   08/14/21 1844  Do not attempt resuscitation (DNR)  Continuous       Question Answer Comment  In the event of cardiac or respiratory ARREST Do not call a "code blue"   In the event of cardiac or respiratory ARREST Do not perform Intubation, CPR, defibrillation or ACLS   In the event of cardiac or respiratory ARREST Use medication by any route, position, wound care, and other measures to relive pain and suffering. May use oxygen, suction and manual treatment of airway obstruction as needed for comfort.      08/14/21 1849           Code Status History     Date Active Date Inactive Code Status Order ID Comments User Context   08/03/2021 1714 08/10/2021 2302 DNR [id number]  Leslee Home, DO ED   05/28/2021 1927 06/02/2021 2335 DNR [id number]  Norval Morton, MD ED   05/28/2021 1851 05/28/2021 1927 Full Code [id number]  Norval Morton, MD ED   02/03/2021 1805 02/12/2021 1902 DNR [id number]  Mercy Riding, MD Inpatient   02/01/2021 1653 02/03/2021 1805 Full Code [id number]  Collier Bullock, MD ED   10/29/2020 1650 11/09/2020 1747 Full Code [id number]  Cox, [place], DO ED   06/17/2020 2215 06/21/2020 2140 Full Code [id number]  Mansy, Arvella Merles, MD ED   05/21/2020 2251 05/25/2020 1950 Full Code [id number]  Athena Masse, MD Inpatient   09/28/2019 0120 10/02/2019 2116 DNR [id number]  Sueanne Margarita, [place] ED   06/28/2019 1333 06/30/2019 1619 DNR [id number]  Ivor Costa, MD Inpatient   06/28/2019 1255 06/28/2019 1333 DNR [id number]  Ivor Costa, MD Inpatient   02/23/2019 1938  02/27/2019 1810 Full Code [id number]  Edwin Dada, MD Inpatient   02/13/2019 2011 02/16/2019 1819 DNR [id number]  Nicolette Bang, DO ED   09/14/2018 2220 09/17/2018 2350 DNR [id number]  Henreitta Leber, MD Inpatient   08/05/2018 1547 08/10/2018 1949 Full Code [id number]  Gladstone Lighter, MD Inpatient   06/25/2018 1417 06/30/2018 1557 Full Code [id number]  Epifanio Lesches, MD ED   09/15/2017 1747 09/18/2017 2106 DNR [id number]  Hillary Bow, MD ED   08/26/2017 2216 08/30/2017 1604 Full Code [id number]  Nicholes Mango, MD Inpatient   01/08/2015 0307 01/09/2015 1336 Full Code [id number]  Theodoro Grist, MD Inpatient      Advance Directive Documentation    Flowsheet Row Most Recent Value  Type of Advance Directive Out of facility DNR (pink MOST or yellow form)  Pre-existing out of facility DNR order (yellow form or pink MOST form) --  "MOST" Form in Place? --       Prognosis:  < 6 months   Thank you for allowing the Palliative Medicine Team to assist in the care of this patient.    Asencion Gowda, NP  Please contact Palliative Medicine Team phone at [phone] for questions and concerns.

## 2021-09-01 NOTE — Progress Notes (Signed)
PULMONOLOGY         Date: 09/01/2021,   MRN# [id number] Karen [name] [birth year]     AdmissionWeight: 93.5 kg                 CurrentWeight: 90.3 kg  Referring provider: Dr. Sheppard Coil   CHIEF COMPLAINT:   Recurrent pleural effusion with acute on chronic hypoxemic respiratory failure   HISTORY OF PRESENT ILLNESS   This is a pleasant 76 year old female with heart failure with preserved EF at 50%, recurrent hypoxemia, CKD, advanced COPD, diabetes, peripheral edema, GI bleeding history of dyslipidemia essential hypertension iron deficiency anemia valvular heart disease history of GI bleeding who came in with chest discomfort x2 days and has been hospitalized already for 14 days initial admission 08/14/2021.  Initially diagnosed with pneumonia status post full scope of therapy for community-acquired pneumonia.  She generally wears about 3 L/min nasal cannula.  On arrival she received IV resuscitative fluids and empiric cefepime and vancomycin noted to have accelerated atrial fibrillation treated with IV diltiazem with mild improvement however development of bilateral large pleural effusion status postthoracentesis.  Despite all this she continues to deteriorate and has had palliative care evaluation with CODE STATUS advance to DNR.  Today during discussion she wishes to continue full scope of therapy at this moment is on noninvasive ventilator with BiPAP.  She had 850 cc straw-colored fluid removed via left thoracentesis 08/16/2021 and same 1 week later CT chest independently reviewed by me with bibasilar atelectasis and compressive atelectasis of the left lung with surrounding effusion as well as pericardial effusion. PCCM consultation for further evaluation and management   08/30/21 - patient remains hypoxemic. Slight improvement on HFNC currently on 15L/min 75% spO2>90%.  S/p Therapeutic Thora >1.1L drained. Repeat CXR today for interval changes post Metaneb recruitment. Remains on  aggressive diuretic regimen with Metolazone, aldactone, torsemide currently 24L net negative. Today further reduced non essential nephrotoxins by reducing protonix to 17m po daily, due to AF have dcd Albuterol and Duoneb and Anoro ellipta inhaler and will use xopenex only q8h PRN for now.  Amiodarone has been dcd appreciate cardiology input. She should try to use BIPAP more instead of HFNC due to hypercapnia which is likely to get worse in context of contraction alkalosis. Diet modified to Renal fluid restricted 1200cc/24h.     08/31/21- patient weaned to 12L [place] from 75% 15L/min HFNC.  25L net negative. Devt of mild AKI, have reduced diuretics to aldactone only due to hypokalemia. Plan to continue metaneb, PT/OT. Minimized meds for renal sparing. Overall improved markedly over past 48h.  Reviewed plan with RT for recruitment due to persistent atelectatic changes.  She is sitting up in chair and appears more comfortable but still very weak and frail.   09/01/21- patient continues to improve. I met with husband today.  Overall long term prognosis is <6 months.  I discussed potential home hospice for her. She is now on 10L/min great improvement over past few days.    PAST MEDICAL HISTORY   Past Medical History:  Diagnosis Date   (HFpEF) heart failure with preserved ejection fraction ([place]    a. 2017 Echo: EF 50%; b. 06/2018 Echo: EF 50-55%; c. 08/2018 Echo: EF 50-55%, Nl RV fxn; d. 09/2019 Echo: EF 55-60%, no rwma, mild LVH, Gr1 DD, nl RV size/fxn, PASP 63.627mg. Mildly dil LA. Triv MR. Mod AS (AoV 0.94cm^2 VTI; mean grad 17.65m50m); d. 02/2021 Echo: EF 60-65%, no rwma, GrI DD, mildly red  RV fxn, RVSP 64.91mHg, mild-mod MR, mod-sev TR, mild AS.   Acute on chronic respiratory failure with hypoxia and hypercapnia (HCC) 01/07/2015   Anemia    Asterixis 01/07/2015   Asthma    Cataract    CKD (chronic kidney disease), stage III (HCC)    COPD (chronic obstructive pulmonary disease) ([place]    a. 06/2018 tobacco  use, home 3L oxygen    Diabetes mellitus without complication ([facility]    a. 09/2019 A1C 8.8   Edema, peripheral 04/20/2014   GI bleed 06/28/2019   History of kidney stones    Hyperlipidemia    Hypertension    Iron deficiency anemia 06/22/2014   Junctional bradycardia    a. In setting of beta blocker therapy.   Leucocytosis 10/19/2015   Moderate aortic stenosis    a.  09/2019 Echo: Mod AS (AoV 0.94cm^2 VTI; mean grad 17.350mg); b. 02/2021 Echo: Mild AS.   Morbid obesity ([place]   Overactive bladder    Primary osteoarthritis of right knee 09/01/2016   Sciatica 01/07/2015   Valvular heart disease    a. 02/2021 Echo: EF 60-65%, no rwma, GrI DD, mild-mod MR, mod-sev TR, mild AS     SURGICAL HISTORY   Past Surgical History:  Procedure Laterality Date   APPENDECTOMY     CESAREAN SECTION     x3   CHOLECYSTECTOMY     COLONOSCOPY WITH PROPOFOL N/A 08/28/2017   Procedure: COLONOSCOPY WITH PROPOFOL;  Surgeon: WoLucilla LameMD;  Location: [facility] ENDOSCOPY;  Service: Endoscopy;  Laterality: N/A;   COLONOSCOPY WITH PROPOFOL N/A 08/29/2017   Procedure: COLONOSCOPY WITH PROPOFOL;  Surgeon: WoLucilla LameMD;  [facility];  Service: Endoscopy;  Laterality: N/A;   CYSTOSCOPY W/ URETERAL STENT PLACEMENT Right 09/15/2017   Procedure: CYSTOSCOPY WITH RETROGRADE PYELOGRAM/URETERAL STENT PLACEMENT;  Surgeon: McCleon GustinMD;  Location: [facility] ORS;  Service: Urology;  Laterality: Right;   CYSTOSCOPY/URETEROSCOPY/HOLMIUM LASER/STENT PLACEMENT Right 10/09/2017   Procedure: CYSTOSCOPY/URETEROSCOPY/HOLMIUM LASER/STENT PLACEMENT;  Surgeon: StAbbie SonsMD;  Location: [facility] ORS;  Service: Urology;  Laterality: Right;  right Stent exchange   ESOPHAGOGASTRODUODENOSCOPY (EGD) WITH PROPOFOL N/A 09/16/2018   Procedure: ESOPHAGOGASTRODUODENOSCOPY (EGD) WITH PROPOFOL;  Surgeon: VaLin LandsmanMD;  [facility] Service[facility];  Laterality: N/A;   EYE SURGERY     IR  THORACENTESIS ASP PLEURAL SPACE W/IMG GUIDE  02/07/2021     FAMILY HISTORY   Family History  Problem Relation Age of Onset   Other Mother        unknown medical history   Other Father        unknown medical history     SOCIAL HISTORY   Social History   Tobacco Use   Smoking status: Former    Packs/day: 1.00    Years: 20.00    Total pack years: 20.00    Types: Cigarettes    Quit date: 12/04/1992    Years since quitting: 28.7   Smokeless tobacco: Never  Vaping Use   Vaping Use: Never used  Substance Use Topics   Alcohol use: No   Drug use: No     MEDICATIONS    Home Medication:    Current Medication:  Current Facility-Administered Medications:    acetaminophen (TYLENOL) tablet 1,000 mg, 1,000 mg, Oral, BID, Nurah Petrides, MD, 1,000 mg at 09/01/21 1001   acetaminophen (TYLENOL) tablet 500-1,000 mg, 500-1,000 mg, Oral, Q6H PRN, Norins, MiHeinz KnucklesMD, 500 mg at 08/31/21 1612   allopurinol (ZYLOPRIM) tablet  100 mg, 100 mg, Oral, Daily, Norins, Heinz Knuckles, MD, 100 mg at 09/01/21 1001   apixaban (ELIQUIS) tablet 5 mg, 5 mg, Oral, BID, Agbor-Etang, Aaron Edelman, MD, 5 mg at 09/01/21 1001   atorvastatin (LIPITOR) tablet 10 mg, 10 mg, Oral, Daily, Norins, Heinz Knuckles, MD, 10 mg at 09/01/21 1001   empagliflozin (JARDIANCE) tablet 10 mg, 10 mg, Oral, Daily, Norins, Heinz Knuckles, MD, 10 mg at 09/01/21 1001   ferrous sulfate tablet 325 mg, 325 mg, Oral, Q breakfast, Norins, Heinz Knuckles, MD, 325 mg at 09/01/21 0827   fluticasone (FLONASE) 50 MCG/ACT nasal spray 2 spray, 2 spray, Each Nare, Daily, Norins, Heinz Knuckles, MD, 2 spray at 08/28/21 0824   insulin aspart (novoLOG) injection 0-20 Units, 0-20 Units, Subcutaneous, TID WC, Norins, Heinz Knuckles, MD, 20 Units at 09/01/21 1231   insulin aspart (novoLOG) injection 10 Units, 10 Units, Subcutaneous, TID WC, Adhikari, Amrit, MD, 10 Units at 09/01/21 1231   insulin glargine-yfgn (SEMGLEE) injection 22 Units, 22 Units, Subcutaneous, BID, Sharion Settler, NP, 22 Units at 09/01/21 1004   levalbuterol (XOPENEX) nebulizer solution 0.63 mg, 0.63 mg, Nebulization, Q8H PRN, Ottie Glazier, MD   lidocaine (LIDODERM) 5 % 2 patch, 2 patch, Transdermal, Q24H, Annita Brod, MD, 2 patch at 08/31/21 2131   melatonin tablet 5 mg, 5 mg, Oral, QHS, Norins, Heinz Knuckles, MD, 5 mg at 08/31/21 2133   Oral care mouth rinse, 15 mL, Mouth Rinse, PRN, Emeterio Reeve, DO   oxyCODONE (Oxy IR/ROXICODONE) immediate release tablet 5 mg, 5 mg, Oral, Q6H PRN, Wyvonnia Dusky, MD, 5 mg at 09/01/21 0827   pantoprazole (PROTONIX) EC tablet 20 mg, 20 mg, Oral, Daily, Lanney Gins, Tylynn Braniff, MD, 20 mg at 09/01/21 1000   potassium chloride SA (KLOR-CON M) CR tablet 20 mEq, 20 mEq, Oral, BID, Agbor-Etang, Aaron Edelman, MD, 20 mEq at 09/01/21 1001   senna-docusate (Senokot-S) tablet 1 tablet, 1 tablet, Oral, BID BM PRN, Norins, Heinz Knuckles, MD, 1 tablet at 08/18/21 7096   spironolactone (ALDACTONE) tablet 25 mg, 25 mg, Oral, Daily, Ottie Glazier, MD, 25 mg at 09/01/21 1000   traZODone (DESYREL) tablet 50 mg, 50 mg, Oral, QHS PRN, Norins, Heinz Knuckles, MD, 50 mg at 08/25/21 2213    ALLERGIES   Ace inhibitors, Beta adrenergic blockers, Gabapentin, Lisinopril, Lyrica [pregabalin], and Shrimp [shellfish allergy]     REVIEW OF SYSTEMS    Review of Systems:  Gen:  Denies  fever, sweats, chills weigh loss  HEENT: Denies blurred vision, double vision, ear pain, eye pain, hearing loss, nose bleeds, sore throat Cardiac:  No dizziness, chest pain or heaviness, chest tightness,edema Resp:   reports dyspnea chronically  Gi: Denies swallowing difficulty, stomach pain, nausea or vomiting, diarrhea, constipation, bowel incontinence Gu:  Denies bladder incontinence, burning urine Ext:   Denies Joint pain, stiffness or swelling Skin: Denies  skin rash, easy bruising or bleeding or hives Endoc:  Denies polyuria, polydipsia , polyphagia or weight change Psych:   Denies depression,  insomnia or hallucinations   Other:  All other systems negative   VS: BP (!) 103/58 (BP Location: Right Arm)   Pulse (!) 104   Temp 98.1 F (36.7 C) (Oral)   Resp 20   Ht '5\' 3"'  (1.6 m)   Wt 90.3 kg   SpO2 97%   BMI 35.26 kg/m      PHYSICAL EXAM    GENERAL:NAD, no fevers, chills, no weakness no fatigue HEAD: Normocephalic, atraumatic.  EYES: Pupils equal, round, reactive  to light. Extraocular muscles intact. No scleral icterus.  MOUTH: Moist mucosal membrane. Dentition intact. No abscess noted.  EAR, NOSE, THROAT: Clear without exudates. No external lesions.  NECK: Supple. No thyromegaly. No nodules. No JVD.  PULMONARY: decreased breath sounds with mild rhonchi worse at bases bilaterally.  CARDIOVASCULAR: S1 and S2. Regular rate and rhythm. No murmurs, rubs, or gallops. No edema. Pedal pulses 2+ bilaterally.  GASTROINTESTINAL: Soft, nontender, nondistended. No masses. Positive bowel sounds. No hepatosplenomegaly.  MUSCULOSKELETAL: No swelling, clubbing, or edema. Range of motion full in all extremities.  NEUROLOGIC: Cranial nerves II through XII are intact. No gross focal neurological deficits. Sensation intact. Reflexes intact.  SKIN: No ulceration, lesions, rashes, or cyanosis. Skin warm and dry. Turgor intact.  PSYCHIATRIC: Mood, affect within normal limits. The patient is awake, alert and oriented x 3. Insight, judgment intact.       IMAGING     ASSESSMENT/PLAN   Acute on chronic hypoxemic respiratory failure -This is secondary to compressive atelectasis from surrounding moderate pleural effusions bilaterally status post repeat thoracentesis. -Culprit of underlying effusions seems to be CKD with CHF status post IV fluid for initial treatment with presumed sepsis.  -Patient is currently on multiple diuretics with relative hypotension precluding from making adequate perfusion and diuresis. -We discussed Pleurx catheter momentarily and she wishes for additional  discussion with her family as well as review of cost and management. -I have changed some of her medication we discussed reducing narcotics and other nonessential nephrotoxins for now. -Due to relative hypotension will start amiodarone and hold Cardizem to allow [place] to diurese her a little better -She will need recruitment maneuvers with MetaNeb device to help wean down oxygen -decreased diuresis due to AKI -reviewed ABG with metabolic alkalosis due to contraction     Bilateral compressive atelectasis Chest physiotherapy with recruitment maneuvers utilizing MetaNeb            Thank you for allowing me to participate in the care of this patient.   Patient/Family are satisfied with care plan and all questions have been answered.    Provider disclosure: Patient with at least one acute or chronic illness or injury that poses a threat to life or bodily function and is being managed actively during this encounter.  All of the below services have been performed independently by signing provider:  review of prior documentation from internal and or external health records.  Review of previous and current lab results.  Interview and comprehensive assessment during patient visit today. Review of current and previous chest radiographs/CT scans. Discussion of management and test interpretation with health care team and patient/family.   This document was prepared using Dragon voice recognition software and may include unintentional dictation errors.     Ottie Glazier, M.D.  Division of Pulmonary & Critical Care Medicine

## 2021-09-01 NOTE — Progress Notes (Signed)
[facility] Progress Note  Patient: Karen [name]    LKG:[id number]  DOA: 08/14/2021    Date of Service: the patient was seen and examined on 09/01/2021  Brief hospital course: 76 year old female with past medical history of diastolic heart failure, stage IV chronic kidney disease, COPD, diabetes mellitus and hypertension with recent hospitalization discharged to skilled nursing on 6/7 who presented back to emergency room on 6/11 for left-sided chest pain that had started 2 days prior.  At time of admission, patient treated for sepsis secondary to pneumonia and placed on broad-spectrum antibiotics as well as found to have atrial fibrillation with rapid ventricular rate and acute on chronic diastolic heart failure.  Patient since admission has needed high flow oxygen/BiPAP.  Since admission, patient has required numerous thoracenteses.  She required BiPAP at times, but then has refused, leading to episodes of decompensation.  Over the course of the past week, patient's oxygenation has steadily improved going from continuous BiPAP to intermittent BiPAP with high flow nasal cannula from 15 L currently down to 8 L.  Palliative care has been involved.  Assessment and Plan: Assessment and Plan: * Acute on chronic respiratory failure with hypoxia ([place]) Patient on 3L O2 at home. ON presentation she was mildly hypoxemic 2/2 CHF and PNA, with recurrent pleural effusions and overall decline, has required BiPAP and high flow oxygen.  She is actually had some improvement over the past few days going from 15 L currently to 8 L.  High risk of decompensation.  In part due to noncompliance with BiPAP  Pulmonary hypertension, unspecified ([place]) Contributing to pulmonary issues  Acute on chronic heart failure with preserved ejection fraction (HFpEF) ([place]) Last Echo 08/04/21 revealing grade III DD, tricuspid regurg. She now presents with pulmonary edema, increased SOB.  Has diuresed over 18 L and is almost - 15 L  deficient.  Her weight has dropped approximately 10 pounds   COPD (chronic obstructive pulmonary disease) ([place]) Continue home regimen  Sepsis due to pneumonia (HCC)-resolved as of 08/31/2021 Resolved.  Met criteria for sepsis on admission given tachypnea, tachycardia and pulmonary source.  Sepsis stabilized and completed antibiotic course.  HAP (hospital-acquired pneumonia) To hospital this admission w/ sepsis - resolved Treatment completed  OSA (obstructive sleep apnea) Will use CPAP  Acute kidney injury superimposed on CKD (HCC) Creatinine around 1.05 and has been stable.  With diuresis creatinine had increased to 1.37 and diuretics held, slight improvement today.  Permanent atrial fibrillation (HCC) Continue Eliquis.  For the most part rate controlled and as her oxygenation improves, heart rate should come down  Uncontrolled type 2 diabetes mellitus with hyperglycemia, with long-term current use of insulin ([place]) Patient with very high glucose. She is on basal insulin, dapaglifozin. SSI  Hyponatremia Chronic recurring problem.  Currently at 132   Hypokalemia Chronic recurring problem. Diuretics a contributing cause.  Replace as needed  Gastroesophageal reflux disease without esophagitis Continue PPI tx       Body mass index is 35.26 kg/m.        Consultants: Pulmonary Cardiology Palliative care  Procedures: Echocardiogram done 6/17 noting preserved ejection fraction  Antimicrobials: Completed antibiotic course for sepsis/HCAP  Code Status: DNR   Subjective: Patient still has lower back pain.  Breathing a little easier  Objective: Vital signs were reviewed and unremarkable. Vitals:   09/01/21 1238 09/01/21 1532  BP:  111/78  Pulse:  79  Resp:  (!) 21  Temp: 98.1 F (36.7 C) 98.3 F (36.8 C)  SpO2:  98%    Intake/Output Summary (Last 24 hours) at 09/01/2021 1550 Last data filed at 09/01/2021 1534 Gross per 24 hour  Intake 358 ml  Output 2500  ml  Net -2142 ml   Filed Weights   08/31/21 0217 09/01/21 0342 09/01/21 0500  Weight: 88.7 kg 88.3 kg 90.3 kg   Body mass index is 35.26 kg/m.  Exam:  General: Alert and oriented x3, no acute distress HEENT: Normocephalic and atraumatic, mucous membranes are moist Cardiovascular: Irregular rhythm, rate controlled Respiratory: Decreased breath sounds throughout Abdomen: Soft, nontender, nondistended, positive bowel sounds Musculoskeletal: No clubbing or cyanosis or edema Skin: No skin breaks, tears or lesions Psychiatry: Appropriate, no evidence of psychoses Neurology: No focal deficits  Data Reviewed: Creatinine down to 1.3.  Sodium at 132.  Disposition:  Status is: Inpatient Remains inpatient appropriate because: Further improvement in hypoxia Patient amenable to skilled nursing  Anticipated discharge date: 7/3  Family Communication: Husband at bedside DVT Prophylaxis: Place and maintain sequential compression device Start: 08/17/21 1311 apixaban (ELIQUIS) tablet 5 mg    Author: Annita Brod ,MD 09/01/2021 3:50 PM  To reach On-call, see care teams to locate the attending and reach out via [email or web address]. Between 7PM-7AM, please contact night-coverage If you still have difficulty reaching the attending provider, please page the [facility] (Director on Call) for [facility] on amion for assistance.

## 2021-09-01 NOTE — Progress Notes (Signed)
Progress Note  Patient Name: Karen [name] Date of Encounter: 09/01/2021  [facility] Cardiologist: Ida Rogue, MD   Subjective   Breathing is a little better, has a sore back.  Torsemide and metolazone held yesterday due to worsening renal function.  Net -2.4 L, diuretics.  Inpatient Medications    Scheduled Meds:  acetaminophen  1,000 mg Oral BID   allopurinol  100 mg Oral Daily   apixaban  5 mg Oral BID   atorvastatin  10 mg Oral Daily   empagliflozin  10 mg Oral Daily   ferrous sulfate  325 mg Oral Q breakfast   fluticasone  2 spray Each Nare Daily   insulin aspart  0-20 Units Subcutaneous TID WC   insulin aspart  10 Units Subcutaneous TID WC   insulin glargine-yfgn  22 Units Subcutaneous BID   lidocaine  2 patch Transdermal Q24H   melatonin  5 mg Oral QHS   pantoprazole  20 mg Oral Daily   potassium chloride  20 mEq Oral BID   spironolactone  25 mg Oral Daily   Continuous Infusions:  PRN Meds: acetaminophen, levalbuterol, mouth rinse, oxyCODONE, senna-docusate, traZODone   Vital Signs    Vitals:   09/01/21 0342 09/01/21 0408 09/01/21 0500 09/01/21 0749  BP: 119/69   (!) 103/58  Pulse: 82   (!) 104  Resp: 20     Temp: 97.7 F (36.5 C)   98 F (36.7 C)  TempSrc: Oral   Oral  SpO2: 98% 98%  97%  Weight: 88.3 kg  90.3 kg   Height:        Intake/Output Summary (Last 24 hours) at 09/01/2021 1221 Last data filed at 09/01/2021 0542 Gross per 24 hour  Intake 118 ml  Output 2050 ml  Net -1932 ml      09/01/2021    5:00 AM 09/01/2021    3:42 AM 08/31/2021    2:17 AM  Last 3 Weights  Weight (lbs) 199 lb 1.2 oz 194 lb 9.6 oz 195 lb 8.8 oz  Weight (kg) 90.3 kg 88.27 kg 88.7 kg      Telemetry    Atrial fibrillation, heart rate 97-105- Personally Reviewed  ECG     - Personally Reviewed  Physical Exam   GEN: Appears weak and tired Neck: No JVD Cardiac: Irregularly irregular Respiratory: Diminished breath sounds bilaterally, no wheezing GI:  Soft, nontender, non-distended  MS: No edema; No deformity. Neuro:  Nonfocal  Psych: Normal affect   Labs    High Sensitivity Troponin:   Recent Labs  Lab 08/03/21 1324 08/03/21 1549 08/14/21 1518 08/14/21 1653  TROPONINIHS 11 10 16 12      Chemistry Recent Labs  Lab 08/29/21 0447 08/31/21 0538 09/01/21 0545  NA 133* 134* 132*  K 4.3 3.0* 3.5  CL 78* 75* 77*  CO2 40* >45* 42*  GLUCOSE 128* 131* 134*  BUN 41* 59* 63*  CREATININE 1.08* 1.37* 1.30*  CALCIUM 9.1 9.1 8.9  MG 2.1  --   --   PROT  --  6.8  --   ALBUMIN  --  2.7*  --   AST  --  14*  --   ALT  --  13  --   ALKPHOS  --  72  --   BILITOT  --  0.8  --   GFRNONAA 53* 40* 42*  ANIONGAP 15 NOT CALCULATED 13    Lipids No results for input(s): "CHOL", "TRIG", "HDL", "LABVLDL", "LDLCALC", "CHOLHDL" in the  last 168 hours.  Hematology Recent Labs  Lab 08/29/21 0447 08/31/21 0538 09/01/21 0545  WBC 22.2* 16.5* 16.3*  RBC 3.88 4.03 3.95  HGB 10.8* 11.0* 10.8*  HCT 33.8* 34.2* 33.7*  MCV 87.1 84.9 85.3  MCH 27.8 27.3 27.3  MCHC 32.0 32.2 32.0  RDW 14.8 14.7 14.6  PLT 400 365 391   Thyroid No results for input(s): "TSH", "FREET4" in the last 168 hours.  BNP No results for input(s): "BNP", "PROBNP" in the last 168 hours.   DDimer No results for input(s): "DDIMER" in the last 168 hours.   Radiology    DG Chest Port 1 View  Result Date: 08/30/2021 CLINICAL DATA:  Atelectasis. History of hypertension, COPD, heart failure. EXAM: PORTABLE CHEST 1 VIEW COMPARISON:  08/29/2021 FINDINGS: Heart is enlarged. There are bilateral pleural effusions. There is increased opacity at the LEFT lung base, consistent with effusion and atelectasis or infiltrate. There is mild interstitial pulmonary edema. IMPRESSION: 1. Stable cardiomegaly. 2. Bilateral pleural effusions. 3. Increased opacity at the LEFT lung base. Appearance is similar to studies performed on 08/28/2021. Electronically Signed   By: Nolon Nations M.D.   On:  08/30/2021 15:57    Cardiac Studies   Limited TTE 08/20/2021 1. Left ventricular ejection fraction, by estimation, is 55 to 60%. The  left ventricle has normal function.   2. Right ventricular systolic function is low normal.   3. A small pericardial effusion is present. Large pleural effusion in the  left lateral region.   4. The aortic valve is calcified.   5. The inferior vena cava is dilated in size with <50% respiratory  variability, suggesting right atrial pressure of 15 mmHg.   Patient Profile     76 y.o. female with history of HFpEF, COPD, obesity, respiratory failure, oxygen presenting with shortness of breath and edema, diagnosed with pneumonia and respiratory failure, requiring pleural effusions requiring thoracentesis x2 being seen for A-fib RVR and HFpEF.  Assessment & Plan    HFpEF -Appears euvolemic -Net -2.4 L over the past 24 hours of diuretics -Creatinine stabilizing -Continue to hold diuretics with aki, keep ins and outs net negative -Monitor creatinine.  Continue Aldactone. -PTA torsemide when Creatinine stabilizes   2.   Persistent A-fib -Heart rate reasonable -on No AV nodal agents due to low normal BP -Eliquis 5 mg twice daily   4.  COPD, respiratory failure, pneumonia, recurrent pleural effusion -S/p left thoracentesis x2 -Will benefit from pleural cath -On nasal cannula today -Management as per primary team, pulmonary medicine   Prognosis remains guarded.   Total encounter time more than 50 minutes  Greater than 50% was spent in counseling and coordination of care with the patient     Signed, Kate Sable, MD  09/01/2021, 12:21 PM

## 2021-09-01 NOTE — Progress Notes (Signed)
Attempted to place PT on BIPAP for HS, PT refused. PT was encouraged to wear for HS and continued progress towards oxygenation goals, but PT still refused. Stable on 7L HFNC at this time.

## 2021-09-02 DIAGNOSIS — N179 Acute kidney failure, unspecified: Secondary | ICD-10-CM | POA: Diagnosis not present

## 2021-09-02 DIAGNOSIS — I472 Ventricular tachycardia, unspecified: Secondary | ICD-10-CM

## 2021-09-02 DIAGNOSIS — J9621 Acute and chronic respiratory failure with hypoxia: Secondary | ICD-10-CM | POA: Diagnosis not present

## 2021-09-02 DIAGNOSIS — I4821 Permanent atrial fibrillation: Secondary | ICD-10-CM | POA: Diagnosis not present

## 2021-09-02 DIAGNOSIS — I5033 Acute on chronic diastolic (congestive) heart failure: Secondary | ICD-10-CM | POA: Diagnosis not present

## 2021-09-02 DIAGNOSIS — I272 Pulmonary hypertension, unspecified: Secondary | ICD-10-CM | POA: Diagnosis not present

## 2021-09-02 LAB — GLUCOSE, CAPILLARY
Glucose-Capillary: 166 mg/dL — ABNORMAL HIGH (ref 70–99)
Glucose-Capillary: 457 mg/dL — ABNORMAL HIGH (ref 70–99)
Glucose-Capillary: 282 mg/dL — ABNORMAL HIGH (ref 70–99)
Glucose-Capillary: 229 mg/dL — ABNORMAL HIGH (ref 70–99)

## 2021-09-02 LAB — BASIC METABOLIC PANEL
Anion gap: 13 (ref 5–15)
BUN: 54 mg/dL — ABNORMAL HIGH (ref 8–23)
CO2: 39 mmol/L — ABNORMAL HIGH (ref 22–32)
Calcium: 9.2 mg/dL (ref 8.9–10.3)
Chloride: 80 mmol/L — ABNORMAL LOW (ref 98–111)
Creatinine, Ser: 1.15 mg/dL — ABNORMAL HIGH (ref 0.44–1.00)
GFR, Estimated: 49 mL/min — ABNORMAL LOW (ref >60)
Glucose, Bld: 141 mg/dL — ABNORMAL HIGH (ref 70–99)
Potassium: 3.5 mmol/L (ref 3.5–5.1)
Sodium: 132 mmol/L — ABNORMAL LOW (ref 135–145)

## 2021-09-02 LAB — CBC
HCT: 34.1 % — ABNORMAL LOW (ref 36.0–46.0)
Hemoglobin: 10.8 g/dL — ABNORMAL LOW (ref 12.0–15.0)
MCH: 26.9 pg (ref 26.0–34.0)
MCHC: 31.7 g/dL (ref 30.0–36.0)
MCV: 85 fL (ref 80.0–100.0)
Platelets: 391 10*3/uL (ref 150–400)
RBC: 4.01 MIL/uL (ref 3.87–5.11)
RDW: 14.4 % (ref 11.5–15.5)
WBC: 12.7 10*3/uL — ABNORMAL HIGH (ref 4.0–10.5)
nRBC: 0 % (ref 0.0–0.2)

## 2021-09-02 MED ORDER — INSULIN ASPART 100 UNIT/ML IJ SOLN
12.0000 [IU] | Freq: Three times a day (TID) | INTRAMUSCULAR | Status: DC
Start: 2021-09-02 — End: 2021-09-07
  Administered 2021-09-02 – 2021-09-07 (×13): 12 [IU] via SUBCUTANEOUS
  Filled 2021-09-02 (×12): qty 1

## 2021-09-02 MED ORDER — TORSEMIDE 20 MG PO TABS
60.0000 mg | ORAL_TABLET | Freq: Every day | ORAL | Status: DC
Start: 2021-09-02 — End: 2021-09-05
  Administered 2021-09-02 – 2021-09-05 (×4): 60 mg via ORAL
  Filled 2021-09-02 (×4): qty 3

## 2021-09-02 NOTE — Progress Notes (Signed)
Progress Note  Patient Name: Karen [name] Date of Encounter: 09/02/2021  [facility] Cardiologist: Ida Rogue, MD   Subjective   Feeling a bit better today with less shortness of breath.  Minimal leg edema reported.  No chest pain.  Main complaint is of back pain; hasn't been out of bed for most of this admission.  Inpatient Medications    Scheduled Meds:  acetaminophen  1,000 mg Oral BID   allopurinol  100 mg Oral Daily   apixaban  5 mg Oral BID   atorvastatin  10 mg Oral Daily   empagliflozin  10 mg Oral Daily   ferrous sulfate  325 mg Oral Q breakfast   fluticasone  2 spray Each Nare Daily   insulin aspart  0-20 Units Subcutaneous TID WC   insulin aspart  12 Units Subcutaneous TID WC   insulin glargine-yfgn  22 Units Subcutaneous BID   lidocaine  2 patch Transdermal Q24H   melatonin  5 mg Oral QHS   pantoprazole  20 mg Oral Daily   potassium chloride  20 mEq Oral BID   spironolactone  25 mg Oral Daily   Continuous Infusions:  PRN Meds: acetaminophen, levalbuterol, mouth rinse, oxyCODONE, senna-docusate, traZODone   Vital Signs    Vitals:   09/02/21 0321 09/02/21 0331 09/02/21 0752 09/02/21 0825  BP: (!) 121/56  115/63   Pulse: 97  90 86  Resp: 18  17 (!) 22  Temp: 98.5 F (36.9 C)  97.7 F (36.5 C)   TempSrc: Oral  Oral   SpO2: 97%  94% 95%  Weight:  88.7 kg    Height:        Intake/Output Summary (Last 24 hours) at 09/02/2021 0941 Last data filed at 09/02/2021 0324 Gross per 24 hour  Intake 720 ml  Output 2375 ml  Net -1655 ml      09/02/2021    3:31 AM 09/01/2021    5:00 AM 09/01/2021    3:42 AM  Last 3 Weights  Weight (lbs) 195 lb 8.8 oz 199 lb 1.2 oz 194 lb 9.6 oz  Weight (kg) 88.7 kg 90.3 kg 88.27 kg      Telemetry    NSR with pauses of < 2 seconds. - Personally Reviewed  ECG    No new tracing.  Physical Exam   GEN: No acute distress.   Neck: No JVD, though body habitus limits evaluation Cardiac: RRR with 1/6 systolic  murmur.  Respiratory: Coarse breath sounds bilaterally, mildly diminished at the lung bases. GI: Soft, nontender, non-distended  MS: Trace pretibial edema bilaterally; No deformity. Neuro:  Nonfocal  Psych: Normal affect   Labs    High Sensitivity Troponin:   Recent Labs  Lab 08/03/21 1324 08/03/21 1549 08/14/21 1518 08/14/21 1653  TROPONINIHS 11 10 16 12      Chemistry Recent Labs  Lab 08/29/21 0447 08/31/21 0538 09/01/21 0545 09/02/21 0522  NA 133* 134* 132* 132*  K 4.3 3.0* 3.5 3.5  CL 78* 75* 77* 80*  CO2 40* >45* 42* 39*  GLUCOSE 128* 131* 134* 141*  BUN 41* 59* 63* 54*  CREATININE 1.08* 1.37* 1.30* 1.15*  CALCIUM 9.1 9.1 8.9 9.2  MG 2.1  --   --   --   PROT  --  6.8  --   --   ALBUMIN  --  2.7*  --   --   AST  --  14*  --   --   ALT  --  13  --   --   ALKPHOS  --  72  --   --   BILITOT  --  0.8  --   --   GFRNONAA 53* 40* 42* 49*  ANIONGAP 15 NOT CALCULATED 13 13    Lipids No results for input(s): "CHOL", "TRIG", "HDL", "LABVLDL", "LDLCALC", "CHOLHDL" in the last 168 hours.  Hematology Recent Labs  Lab 08/31/21 0538 09/01/21 0545 09/02/21 0522  WBC 16.5* 16.3* 12.7*  RBC 4.03 3.95 4.01  HGB 11.0* 10.8* 10.8*  HCT 34.2* 33.7* 34.1*  MCV 84.9 85.3 85.0  MCH 27.3 27.3 26.9  MCHC 32.2 32.0 31.7  RDW 14.7 14.6 14.4  PLT 365 391 391   Thyroid No results for input(s): "TSH", "FREET4" in the last 168 hours.  BNPNo results for input(s): "BNP", "PROBNP" in the last 168 hours.  DDimer No results for input(s): "DDIMER" in the last 168 hours.   Radiology    No results found.  Cardiac Studies   TTE (08/25/2021):  1. Left ventricular ejection fraction, by estimation, is 60 to 65%. The  left ventricle has normal function. The left ventricle has no regional  wall motion abnormalities. Left ventricular diastolic parameters are  indeterminate.   2. Right ventricular systolic function is mildly reduced. The right  ventricular size is mildly enlarged. There  is moderately elevated  pulmonary artery systolic pressure. The estimated right ventricular  systolic pressure is 37.8 mmHg.   3. Moderate pleural effusion in the left lateral region estimated 6 cm.   4. The mitral valve is normal in structure. No evidence of mitral valve  regurgitation. No evidence of mitral stenosis.   5. Tricuspid valve regurgitation is moderate.   6. The aortic valve was not well visualized. Aortic valve regurgitation  is not visualized. No aortic stenosis is present.   7. The inferior vena cava is dilated in size with >50% respiratory  variability, suggesting right atrial pressure of 8 mmHg.   Patient Profile     76 y.o. female with history of HFpEF, COPD, obesity, respiratory failure, oxygen presenting with shortness of breath and edema, diagnosed with pneumonia and respiratory failure, requiring pleural effusions requiring thoracentesis x2 being seen for A-fib RVR and HFpEF.  Assessment & Plan    HFpEF and severe pulmonary hypertension: Patient continues to improve symptomatically, helped most by thoracenteses.  Diuretics held due to slight worsening of renal function.  Creatinine improved today. -Restart torsemide 60 mg PO daily (previously on BID dosing + metolazone).  May need further titration based on UOP and creatinine. -Poor long-term prognosis; I agree with Dr. Lanney Gins that life expectancy is less than 6 months.  Acute on chronic respiratory failure with hypoxia and COPD: O2 requirement improving. -O2 per primary team and pulmonary. -I agree that Pleur-X catheter may be a good option for her to palliate her dyspnea related to recurrent pleural effusions.  Atrial fibrillation/flutter: Maintaining sinus rhythm with brief pauses (<2 seconds) over the last 2 hours. -Continue apixaban.  Defer beta-blocker in setting of soft BP and obstructive lung disease. -Diltiazem also held due to soft BP.  May need to restart, however, if HR's increase and BP  allows.  VT: No further VT noted.  No good long-term antiarrhythmic options. -Could try challenging with low-dose beta-blocker if this recurs.   For questions or updates, please contact [place] Please consult [email or web address] for contact info under [facility].     Signed, Nelva Bush, MD  09/02/2021, 9:41 AM

## 2021-09-02 NOTE — Progress Notes (Signed)
[facility] Progress Note  Patient: Karen [name]    QIH:[id number]  DOA: 08/14/2021    Date of Service: the patient was seen and examined on 09/02/2021  Brief hospital course: 76 year old female with past medical history of diastolic heart failure, stage IV chronic kidney disease, COPD, diabetes mellitus and hypertension with recent hospitalization discharged to skilled nursing on 6/7 who presented back to emergency room on 6/11 for left-sided chest pain that had started 2 days prior.  At time of admission, patient treated for sepsis secondary to pneumonia and placed on broad-spectrum antibiotics as well as found to have atrial fibrillation with rapid ventricular rate and acute on chronic diastolic heart failure.  Patient since admission has needed high flow oxygen/BiPAP.  Since admission, patient has required numerous thoracenteses.  She required BiPAP at times, but then has refused, leading to episodes of decompensation.  Over the course of the past week, patient's oxygenation has steadily improved going from continuous BiPAP to intermittent BiPAP with high flow nasal cannula from 15 L currently down to 5 L.  Palliative care has been involved.  Assessment and Plan: Assessment and Plan: * Acute on chronic respiratory failure with hypoxia ([place]) Patient on 3L O2 at home. ON presentation she was mildly hypoxemic 2/2 CHF and PNA, with recurrent pleural effusions and overall decline, has required BiPAP and high flow oxygen.  She is actually had some improvement over the past few days going from 15 L down currently to 5 L high risk of decompensation.  In part due to noncompliance with BiPAP.  Despite dramatic improvement, overall she has such advanced severe disease she has limited life expectancy likely of 6 months at best.  We will continue current measures and try to get patient down to goal of 3.  Pulmonary hypertension, unspecified ([place]) Contributing to pulmonary issues  Acute on chronic  heart failure with preserved ejection fraction (HFpEF) ([place]) Last Echo 08/04/21 revealing grade III DD, tricuspid regurg. She now presents with pulmonary edema, increased SOB.  Has diuresed over 18 L and is almost - 15 L deficient.  Her weight has dropped approximately 10 pounds   COPD (chronic obstructive pulmonary disease) ([place]) Continue home regimen  Sepsis due to pneumonia (HCC)-resolved as of 08/31/2021 Resolved.  Met criteria for sepsis on admission given tachypnea, tachycardia and pulmonary source.  Sepsis stabilized and completed antibiotic course.  HAP (hospital-acquired pneumonia) To hospital this admission w/ sepsis - resolved Treatment completed  OSA (obstructive sleep apnea) Will use CPAP  Acute kidney injury superimposed on CKD (HCC) Creatinine around 1.05 and has been stable.  With diuresis creatinine had increased to 1.37 and diuretics held, and therefore creatinine continues to improve.  Permanent atrial fibrillation (HCC) Continue Eliquis.  For the most part rate controlled and as her oxygenation improves, heart rate should come down  Uncontrolled type 2 diabetes mellitus with hyperglycemia, with long-term current use of insulin ([place]) Patient with very high glucose. She is on basal insulin, dapaglifozin. Patient seem to have spiking blood sugars at meals.  Appreciate diabetes coordinator help continue to increase scheduled meal coverage  Hyponatremia Chronic recurring problem.  Currently at 132   Hypokalemia Chronic recurring problem. Diuretics a contributing cause.  Replace as needed  Gastroesophageal reflux disease without esophagitis Continue PPI tx       Body mass index is 34.64 kg/m.        Consultants: Pulmonary Cardiology Palliative care  Procedures: Echocardiogram done 6/17 noting preserved ejection fraction  Antimicrobials: Completed antibiotic course  for sepsis/HCAP  Code Status: DNR   Subjective: Low back pain better.  Breathing a  little easier although gets winded when she talks  Objective: Vital signs were reviewed and unremarkable. Vitals:   09/02/21 0825 09/02/21 1132  BP:  99/61  Pulse: 86 89  Resp: (!) 22 19  Temp:  97.9 F (36.6 C)  SpO2: 95% 93%    Intake/Output Summary (Last 24 hours) at 09/02/2021 1608 Last data filed at 09/02/2021 1412 Gross per 24 hour  Intake 960 ml  Output 2775 ml  Net -1815 ml    Filed Weights   09/01/21 0342 09/01/21 0500 09/02/21 0331  Weight: 88.3 kg 90.3 kg 88.7 kg   Body mass index is 34.64 kg/m.  Exam:  General: Alert and oriented x3, no acute distress HEENT: Normocephalic and atraumatic, mucous membranes are moist Cardiovascular: Irregular rhythm, rate controlled Respiratory: Decreased breath sounds throughout Abdomen: Soft, nontender, nondistended, positive bowel sounds Musculoskeletal: No clubbing or cyanosis or edema Skin: No skin breaks, tears or lesions Psychiatry: Appropriate, no evidence of psychoses Neurology: No focal deficits  Data Reviewed: Creatinine down to 1.15.  Disposition:  Status is: Inpatient Remains inpatient appropriate because: Further improvement in hypoxia Patient amenable to skilled nursing versus home with home hospice  Anticipated discharge date: 7/3  Family Communication: Husband on phone DVT Prophylaxis: Place and maintain sequential compression device Start: 08/17/21 1311 apixaban (ELIQUIS) tablet 5 mg    Author: Annita Brod ,MD 09/02/2021 4:08 PM  To reach On-call, see care teams to locate the attending and reach out via [email or web address]. Between 7PM-7AM, please contact night-coverage If you still have difficulty reaching the attending provider, please page the [facility] (Director on Call) for [facility] on amion for assistance.

## 2021-09-02 NOTE — Progress Notes (Signed)
Physical Therapy Re-Evaluation Patient Details Name: Karen [name] MRN: [id number] DOB: 1945/11/30 Today's Date: 09/02/2021   History of Present Illness Karen [name] is a 34yoF who [facility] 08[facility]14[facility]21 from [facility] for [facility]. P tgiven SL nitro prior to arrival. Pt Dx with PNA 2 days prior. PMH of CHF, COPD on 3L/min, sleep apnea, CKDII/III, HLD, DM, former tobacco use, PAF on eliquis, pulmonary HTN.    PT Comments    Patient alert, agreeable to mobility today (premedicated prior to session for chronic back pain, pt reported 9/10). Pt seen as re-evaluation and goals updated as needed. The patient was ultimately minA to transition to EOB with use of bed function and bed rails. Several minutes spent in sitting to allow pt rest breaks/assess spO2 response. Sit <> stand with RW and minA especially for RW stabilization. The patient started to step towards recliner but stopped, fatigued quickly. MinA to finish transfer to recliner, but pt did endorse being a little SOB at end of session. When spO2 reading appropriately, ranging from 87-94%. The patient would benefit from further skilled PT intervention to continue to progress towards goals. Recommendation remains appropriate.      Recommendations for follow up therapy are one component of a multi-disciplinary discharge planning process, led by the attending physician.  Recommendations may be updated based on patient status, additional functional criteria and insurance authorization.  Follow Up Recommendations  Skilled nursing-short term rehab (<3 hours/day) Can patient physically be transported by private vehicle: No   Assistance Recommended at Discharge Frequent or constant Supervision/Assistance  Patient can return home with the following Assistance with cooking/housework;Assist for transportation;Help with stairs or ramp for entrance;Direct supervision/assist for medications management;A lot of help with walking and/or transfers;A little  help with bathing/dressing/bathroom   Equipment Recommendations  None recommended by PT    Recommendations for Other Services       Precautions / Restrictions Precautions Precautions: Fall Restrictions Weight Bearing Restrictions: No     Mobility  Bed Mobility   Bed Mobility: Supine to Sit     Supine to sit: Min assist     General bed mobility comments: very light minA to finish trunk elevation. pt uses bed function and rails to assist with mobility    Transfers Overall transfer level: Needs assistance Equipment used: Rolling walker (2 wheels) Transfers: Sit to/from Stand Sit to Stand: Min assist   Step pivot transfers: Min assist       General transfer comment: pt stops mid transfer, minA for RW management, weight shifting assistance and to safely lower to recliner    Ambulation/Gait               General Gait Details: deferred at this time   Stairs             Wheelchair Mobility    Modified Rankin (Stroke Patients Only)       Balance Overall balance assessment: Needs assistance Sitting-balance support: Feet supported Sitting balance-Leahy Scale: Good     Standing balance support: Reliant on assistive device for balance, During functional activity, Bilateral upper extremity supported Standing balance-Leahy Scale: Fair Standing balance comment: no LOB however reliant on RW for all standing activity                            Cognition Arousal/Alertness: Awake/alert Behavior During Therapy: WFL for tasks assessed/performed Overall Cognitive Status: Within Functional Limits for tasks assessed  Exercises      General Comments General comments (skin integrity, edema, etc.): on 5L throughout mobility      Pertinent Vitals/Pain Pain Assessment Pain Assessment: 0-10 Pain Score: 9  Pain Location: back pain Pain Descriptors / Indicators: Aching Pain  Intervention(s): Limited activity within patient's tolerance, Monitored during session, Repositioned, Premedicated before session    Home Living                          Prior Function            PT Goals (current goals can now be found in the care plan section) Acute Rehab PT Goals Time For Goal Achievement: 09/16/21 Potential to Achieve Goals: Good Progress towards PT goals: Progressing toward goals    Frequency    Min 2X/week      PT Plan Current plan remains appropriate    Co-evaluation              AM-PAC PT "6 Clicks" Mobility   Outcome Measure  Help needed turning from your back to your side while in a flat bed without using bedrails?: A Lot Help needed moving from lying on your back to sitting on the side of a flat bed without using bedrails?: A Lot Help needed moving to and from a bed to a chair (including a wheelchair)?: A Little Help needed standing up from a chair using your arms (e.g., wheelchair or bedside chair)?: A Little Help needed to walk in hospital room?: A Lot Help needed climbing 3-5 steps with a railing? : Total 6 Click Score: 13    End of Session Equipment Utilized During Treatment: Oxygen (5L) Activity Tolerance: Patient tolerated treatment well Patient left: with call bell/phone within reach;in chair;with chair alarm set Nurse Communication: Mobility status PT Visit Diagnosis: Other abnormalities of gait and mobility (R26.89);Difficulty in walking, not elsewhere classified (R26.2);Muscle weakness (generalized) (M62.81);Pain Pain - Right/Left:  (midline) Pain - part of body:  (back)     Time: 0160-1093 PT Time Calculation (min) (ACUTE ONLY): 21 min  Charges:  $Therapeutic Activity: 8-22 mins                     Lieutenant Diego PT, DPT 11:46 AM,09/02/21

## 2021-09-02 NOTE — Progress Notes (Signed)
PULMONOLOGY         Date: 09/02/2021,   MRN# [id number] Karen [name] 01-04-1946     AdmissionWeight: 93.5 kg                 CurrentWeight: 88.7 kg  Referring provider: Dr. Sheppard Coil   CHIEF COMPLAINT:   Recurrent pleural effusion with acute on chronic hypoxemic respiratory failure   HISTORY OF PRESENT ILLNESS   This is a pleasant 76 year old female with heart failure with preserved EF at 50%, recurrent hypoxemia, CKD, advanced COPD, diabetes, peripheral edema, GI bleeding history of dyslipidemia essential hypertension iron deficiency anemia valvular heart disease history of GI bleeding who came in with chest discomfort x2 days and has been hospitalized already for 14 days initial admission 08/14/2021.  Initially diagnosed with pneumonia status post full scope of therapy for community-acquired pneumonia.  She generally wears about 3 L/min nasal cannula.  On arrival she received IV resuscitative fluids and empiric cefepime and vancomycin noted to have accelerated atrial fibrillation treated with IV diltiazem with mild improvement however development of bilateral large pleural effusion status postthoracentesis.  Despite all this she continues to deteriorate and has had palliative care evaluation with CODE STATUS advance to DNR.  Today during discussion she wishes to continue full scope of therapy at this moment is on noninvasive ventilator with BiPAP.  She had 850 cc straw-colored fluid removed via left thoracentesis 08/16/2021 and same 1 week later CT chest independently reviewed by me with bibasilar atelectasis and compressive atelectasis of the left lung with surrounding effusion as well as pericardial effusion. PCCM consultation for further evaluation and management   08/30/21 - patient remains hypoxemic. Slight improvement on HFNC currently on 15L/min 75% spO2>90%.  S/p Therapeutic Thora >1.1L drained. Repeat CXR today for interval changes post Metaneb recruitment. Remains on  aggressive diuretic regimen with Metolazone, aldactone, torsemide currently 24L net negative. Today further reduced non essential nephrotoxins by reducing protonix to 2m po daily, due to AF have dcd Albuterol and Duoneb and Anoro ellipta inhaler and will use xopenex only q8h PRN for now.  Amiodarone has been dcd appreciate cardiology input. She should try to use BIPAP more instead of HFNC due to hypercapnia which is likely to get worse in context of contraction alkalosis. Diet modified to Renal fluid restricted 1200cc/24h.     08/31/21- patient weaned to 12L [place] from 75% 15L/min HFNC.  25L net negative. Devt of mild AKI, have reduced diuretics to aldactone only due to hypokalemia. Plan to continue metaneb, PT/OT. Minimized meds for renal sparing. Overall improved markedly over past 48h.  Reviewed plan with RT for recruitment due to persistent atelectatic changes.  She is sitting up in chair and appears more comfortable but still very weak and frail.   09/01/21- patient continues to improve. I met with husband today.  Overall long term prognosis is <6 months.  I discussed potential home hospice for her. She is now on 10L/min great improvement over past few days.   09/02/21- patient is improved , down to 5-6L/min.  Renal function is better. CO2 peripherally is improving.  She is doing PT and is slowly improved.  We discussed her prognosis and I recommend to consider home hospice at home. Husband is thinking about it and will let uKoreaknow.    PAST MEDICAL HISTORY   Past Medical History:  Diagnosis Date   (HFpEF) heart failure with preserved ejection fraction ([place]    a. 2017 Echo: EF 50%; b.  06/2018 Echo: EF 50-55%; c. 08/2018 Echo: EF 50-55%, Nl RV fxn; d. 09/2019 Echo: EF 55-60%, no rwma, mild LVH, Gr1 DD, nl RV size/fxn, PASP 63.29mHg. Mildly dil LA. Triv MR. Mod AS (AoV 0.94cm^2 VTI; mean grad 17.355mg); d. 02/2021 Echo: EF 60-65%, no rwma, GrI DD, mildly red RV fxn, RVSP 64.67m59m, mild-mod MR, mod-sev TR,  mild AS.   Acute on chronic respiratory failure with hypoxia and hypercapnia (HCC) 01/07/2015   Anemia    Asterixis 01/07/2015   Asthma    Cataract    CKD (chronic kidney disease), stage III (HCC)    COPD (chronic obstructive pulmonary disease) ([place]CC[place]  a. 06/2018 tobacco use, home 3L oxygen    Diabetes mellitus without complication ([place]  a. 09/2019 A1C 8.8   Edema, peripheral 04/20/2014   GI bleed 06/28/2019   History of kidney stones    Hyperlipidemia    Hypertension    Iron deficiency anemia 06/22/2014   Junctional bradycardia    a. In setting of beta blocker therapy.   Leucocytosis 10/19/2015   Moderate aortic stenosis    a.  09/2019 Echo: Mod AS (AoV 0.94cm^2 VTI; mean grad 17.3mm75m; b. 02/2021 Echo: Mild AS.   Morbid obesity (HCC)[place] Overactive bladder    Primary osteoarthritis of right knee 09/01/2016   Sciatica 01/07/2015   Valvular heart disease    a. 02/2021 Echo: EF 60-65%, no rwma, GrI DD, mild-mod MR, mod-sev TR, mild AS     SURGICAL HISTORY   Past Surgical History:  Procedure Laterality Date   APPENDECTOMY     CESAREAN SECTION     x3   CHOLECYSTECTOMY     COLONOSCOPY WITH PROPOFOL N/A 08/28/2017   Procedure: COLONOSCOPY WITH PROPOFOL;  Surgeon: WohlLucilla Lame;  Location: [facility] ENDOSCOPY;  Service: Endoscopy;  Laterality: N/A;   COLONOSCOPY WITH PROPOFOL N/A 08/29/2017   Procedure: COLONOSCOPY WITH PROPOFOL;  Surgeon: WohlLucilla Lame;  [facility];  Service: Endoscopy;  Laterality: N/A;   CYSTOSCOPY W/ URETERAL STENT PLACEMENT Right 09/15/2017   Procedure: CYSTOSCOPY WITH RETROGRADE PYELOGRAM/URETERAL STENT PLACEMENT;  Surgeon: McKeCleon Gustin;  Location: [facility] ORS;  Service: Urology;  Laterality: Right;   CYSTOSCOPY/URETEROSCOPY/HOLMIUM LASER/STENT PLACEMENT Right 10/09/2017   Procedure: CYSTOSCOPY/URETEROSCOPY/HOLMIUM LASER/STENT PLACEMENT;  Surgeon: StoiAbbie Sons;  Location: [facility] ORS;  Service: Urology;  Laterality: Right;  right  Stent exchange   ESOPHAGOGASTRODUODENOSCOPY (EGD) WITH PROPOFOL N/A 09/16/2018   Procedure: ESOPHAGOGASTRODUODENOSCOPY (EGD) WITH PROPOFOL;  Surgeon: VangLin Landsman;  [facility];  Laterality: N/A;   EYE SURGERY     IR THORACENTESIS ASP PLEURAL SPACE W/IMG GUIDE  02/07/2021     FAMILY HISTORY   Family History  Problem Relation Age of Onset   Other Mother        unknown medical history   Other Father        unknown medical history     SOCIAL HISTORY   Social History   Tobacco Use   Smoking status: Former    Packs/day: 1.00    Years: 20.00    Total pack years: 20.00    Types: Cigarettes    Quit date: 12/04/1992    Years since quitting: 28.7   Smokeless tobacco: Never  Vaping Use   Vaping Use: Never used  Substance Use Topics   Alcohol use: No   Drug use: No     MEDICATIONS    Home Medication:  Current Medication:  Current Facility-Administered Medications:    acetaminophen (TYLENOL) tablet 1,000 mg, 1,000 mg, Oral, BID, Lanney Gins, Loran Fleet, MD, 1,000 mg at 09/02/21 8832   acetaminophen (TYLENOL) tablet 500-1,000 mg, 500-1,000 mg, Oral, Q6H PRN, Norins, Heinz Knuckles, MD, 500 mg at 08/31/21 1612   allopurinol (ZYLOPRIM) tablet 100 mg, 100 mg, Oral, Daily, Norins, Heinz Knuckles, MD, 100 mg at 09/02/21 5498   apixaban (ELIQUIS) tablet 5 mg, 5 mg, Oral, BID, Agbor-Etang, Aaron Edelman, MD, 5 mg at 09/02/21 2641   atorvastatin (LIPITOR) tablet 10 mg, 10 mg, Oral, Daily, Norins, Heinz Knuckles, MD, 10 mg at 09/02/21 5830   empagliflozin (JARDIANCE) tablet 10 mg, 10 mg, Oral, Daily, Norins, Heinz Knuckles, MD, 10 mg at 09/02/21 0935   ferrous sulfate tablet 325 mg, 325 mg, Oral, Q breakfast, Norins, Heinz Knuckles, MD, 325 mg at 09/02/21 0938   fluticasone (FLONASE) 50 MCG/ACT nasal spray 2 spray, 2 spray, Each Nare, Daily, Norins, Heinz Knuckles, MD, 2 spray at 08/28/21 0824   insulin aspart (novoLOG) injection 0-20 Units, 0-20 Units, Subcutaneous, TID WC,  Norins, Heinz Knuckles, MD, 4 Units at 09/02/21 0936   insulin aspart (novoLOG) injection 12 Units, 12 Units, Subcutaneous, TID WC, Annita Brod, MD   insulin glargine-yfgn (SEMGLEE) injection 22 Units, 22 Units, Subcutaneous, BID, Sharion Settler, NP, 22 Units at 09/02/21 0935   levalbuterol (XOPENEX) nebulizer solution 0.63 mg, 0.63 mg, Nebulization, Q8H PRN, Ottie Glazier, MD   lidocaine (LIDODERM) 5 % 2 patch, 2 patch, Transdermal, Q24H, Annita Brod, MD, 2 patch at 09/01/21 2113   melatonin tablet 5 mg, 5 mg, Oral, QHS, Norins, Heinz Knuckles, MD, 5 mg at 09/01/21 2110   Oral care mouth rinse, 15 mL, Mouth Rinse, PRN, Emeterio Reeve, DO   oxyCODONE (Oxy IR/ROXICODONE) immediate release tablet 5 mg, 5 mg, Oral, Q6H PRN, Wyvonnia Dusky, MD, 5 mg at 09/02/21 0940   pantoprazole (PROTONIX) EC tablet 20 mg, 20 mg, Oral, Daily, Ottie Glazier, MD, 20 mg at 09/02/21 0935   potassium chloride SA (KLOR-CON M) CR tablet 20 mEq, 20 mEq, Oral, BID, Agbor-Etang, Aaron Edelman, MD, 20 mEq at 09/02/21 9407   senna-docusate (Senokot-S) tablet 1 tablet, 1 tablet, Oral, BID BM PRN, Norins, Heinz Knuckles, MD, 1 tablet at 08/18/21 6808   spironolactone (ALDACTONE) tablet 25 mg, 25 mg, Oral, Daily, Ottie Glazier, MD, 25 mg at 09/02/21 0940   torsemide (DEMADEX) tablet 60 mg, 60 mg, Oral, Daily, End, Harrell Gave, MD   traZODone (DESYREL) tablet 50 mg, 50 mg, Oral, QHS PRN, Norins, Heinz Knuckles, MD, 50 mg at 08/25/21 2213    ALLERGIES   Ace inhibitors, Beta adrenergic blockers, Gabapentin, Lisinopril, Lyrica [pregabalin], and Shrimp [shellfish allergy]     REVIEW OF SYSTEMS    Review of Systems:  Gen:  Denies  fever, sweats, chills weigh loss  HEENT: Denies blurred vision, double vision, ear pain, eye pain, hearing loss, nose bleeds, sore throat Cardiac:  No dizziness, chest pain or heaviness, chest tightness,edema Resp:   reports dyspnea chronically  Gi: Denies swallowing difficulty, stomach pain,  nausea or vomiting, diarrhea, constipation, bowel incontinence Gu:  Denies bladder incontinence, burning urine Ext:   Denies Joint pain, stiffness or swelling Skin: Denies  skin rash, easy bruising or bleeding or hives Endoc:  Denies polyuria, polydipsia , polyphagia or weight change Psych:   Denies depression, insomnia or hallucinations   Other:  All other systems negative   VS: BP 99/61 (BP Location: Left Arm)   Pulse 89  Temp 97.9 F (36.6 C) (Oral)   Resp 19   Ht '5\' 3"'  (1.6 m)   Wt 88.7 kg   SpO2 93%   BMI 34.64 kg/m      PHYSICAL EXAM    GENERAL:NAD, no fevers, chills, no weakness no fatigue HEAD: Normocephalic, atraumatic.  EYES: Pupils equal, round, reactive to light. Extraocular muscles intact. No scleral icterus.  MOUTH: Moist mucosal membrane. Dentition intact. No abscess noted.  EAR, NOSE, THROAT: Clear without exudates. No external lesions.  NECK: Supple. No thyromegaly. No nodules. No JVD.  PULMONARY: decreased breath sounds with mild rhonchi worse at bases bilaterally.  CARDIOVASCULAR: S1 and S2. Regular rate and rhythm. No murmurs, rubs, or gallops. No edema. Pedal pulses 2+ bilaterally.  GASTROINTESTINAL: Soft, nontender, nondistended. No masses. Positive bowel sounds. No hepatosplenomegaly.  MUSCULOSKELETAL: No swelling, clubbing, or edema. Range of motion full in all extremities.  NEUROLOGIC: Cranial nerves II through XII are intact. No gross focal neurological deficits. Sensation intact. Reflexes intact.  SKIN: No ulceration, lesions, rashes, or cyanosis. Skin warm and dry. Turgor intact.  PSYCHIATRIC: Mood, affect within normal limits. The patient is awake, alert and oriented x 3. Insight, judgment intact.       IMAGING     ASSESSMENT/PLAN   Acute on chronic hypoxemic respiratory failure -This is secondary to compressive atelectasis from surrounding moderate pleural effusions bilaterally status post repeat thoracentesis. -Culprit of underlying  effusions seems to be CKD with CHF status post IV fluid for initial treatment with presumed sepsis.  -Patient is currently on multiple diuretics with relative hypotension precluding from making adequate perfusion and diuresis. -We discussed Pleurx catheter momentarily and she wishes for additional discussion with her family as well as review of cost and management. -I have changed some of her medication we discussed reducing narcotics and other nonessential nephrotoxins for now. -Due to relative hypotension will start amiodarone and hold Cardizem to allow [place] to diurese her a little better -She will need recruitment maneuvers with MetaNeb device to help wean down oxygen -decreased diuresis due to AKI -reviewed ABG with metabolic alkalosis due to contraction     Bilateral compressive atelectasis Chest physiotherapy with recruitment maneuvers utilizing MetaNeb            Thank you for allowing me to participate in the care of this patient.   Patient/Family are satisfied with care plan and all questions have been answered.    Provider disclosure: Patient with at least one acute or chronic illness or injury that poses a threat to life or bodily function and is being managed actively during this encounter.  All of the below services have been performed independently by signing provider:  review of prior documentation from internal and or external health records.  Review of previous and current lab results.  Interview and comprehensive assessment during patient visit today. Review of current and previous chest radiographs/CT scans. Discussion of management and test interpretation with health care team and patient/family.   This document was prepared using Dragon voice recognition software and may include unintentional dictation errors.     Ottie Glazier, M.D.  Division of Pulmonary & Critical Care Medicine

## 2021-09-02 NOTE — Inpatient Diabetes Management (Signed)
Inpatient Diabetes Program Recommendations  AACE/ADA: New Consensus Statement on Inpatient Glycemic Control (2015)  Target Ranges:  Prepandial:   less than 140 mg/dL      Peak postprandial:   less than 180 mg/dL (1-2 hours)      Critically ill patients:  140 - 180 mg/dL   Lab Results  Component Value Date   GLUCAP 166 (H) 09/02/2021   HGBA1C 9.1 (H) 08/08/2021    Review of Glycemic Control  Latest Reference Range & Units 09/01/21 07:24 09/01/21 12:01 09/01/21 17:05 09/01/21 20:55 09/02/21 08:26  Glucose-Capillary 70 - 99 mg/dL 143 (H) 363 (H) 249 (H) 278 (H) 166 (H)  (H): Data is abnormally high  Home DM Meds: Dulaglutide 3 mg Qweek     Jardiance 10 mg daily     Lantus 24 units BID     Novolin R 2-14 units TID with meals   Current Orders: Semglee 25 units BID    Novolog 10 units TID with meals    Novolog 0-20 units TID with meals    Jardiance 10 mg daily  Inpatient Diabetes Program Recommendations:   Postprandial CBGs elevated. Please consider: Increase Novolog Meal Coverage to 12 units TID with meals  Thank you, Nani Gasser. Laquitta Dominski, RN, MSN, CDE  Diabetes Coordinator Inpatient Glycemic Control Team Team Pager [phone] (8am-5pm) 09/02/2021 9:00 AM

## 2021-09-02 NOTE — TOC Progression Note (Addendum)
Transition of Care [facility]) - Progression Note    Patient Details  Name: Karen [name] MRN: [id number] Date of Birth: [birth year]  [facility] CM/SW Contact  Laurena Slimmer, RN Phone Number: 09/02/2021, 10:38 AM  Clinical Narrative:    Patient oxygen trending downward to appropriate SNF level admission. Spoke with Audry Pili from [facility] about possibility of weekend discharge. Audry Pili stated patient could be accepted back to facility this weekend if needed. [facility], [place] will be available. Contact Ricky for weekend discharge at [phone] and fax @ [phone].        Expected Discharge Plan and Services                                                 Social Determinants of Health (SDOH) Interventions    Readmission Risk Interventions    10/30/2020    4:17 PM 06/21/2020    3:03 PM 09/30/2019   11:16 AM  Readmission Risk Prevention Plan  Transportation Screening Complete Complete Complete  PCP or Specialist Appt within 3-5 Days   Complete  Social Work Consult for [place] Planning/Counseling   Complete  Palliative Care Screening   Not Applicable  Medication Review Press photographer) Complete Complete Complete  PCP or Specialist appointment within 3-5 days of discharge Complete Complete   HRI or Home Care Consult Patient refused Complete   SW Recovery Care/Counseling Consult Complete Complete   Palliative Care Screening Not Applicable Not [place] Not Applicable Not Applicable

## 2021-09-03 DIAGNOSIS — J9621 Acute and chronic respiratory failure with hypoxia: Secondary | ICD-10-CM | POA: Diagnosis not present

## 2021-09-03 DIAGNOSIS — I472 Ventricular tachycardia, unspecified: Secondary | ICD-10-CM | POA: Diagnosis not present

## 2021-09-03 DIAGNOSIS — I5033 Acute on chronic diastolic (congestive) heart failure: Secondary | ICD-10-CM | POA: Diagnosis not present

## 2021-09-03 DIAGNOSIS — I480 Paroxysmal atrial fibrillation: Secondary | ICD-10-CM | POA: Diagnosis not present

## 2021-09-03 DIAGNOSIS — I272 Pulmonary hypertension, unspecified: Secondary | ICD-10-CM | POA: Diagnosis not present

## 2021-09-03 LAB — GLUCOSE, CAPILLARY
Glucose-Capillary: 107 mg/dL — ABNORMAL HIGH (ref 70–99)
Glucose-Capillary: 315 mg/dL — ABNORMAL HIGH (ref 70–99)
Glucose-Capillary: 218 mg/dL — ABNORMAL HIGH (ref 70–99)
Glucose-Capillary: 165 mg/dL — ABNORMAL HIGH (ref 70–99)
Glucose-Capillary: 190 mg/dL — ABNORMAL HIGH (ref 70–99)

## 2021-09-03 LAB — BASIC METABOLIC PANEL
Anion gap: 12 (ref 5–15)
BUN: 59 mg/dL — ABNORMAL HIGH (ref 8–23)
CO2: 39 mmol/L — ABNORMAL HIGH (ref 22–32)
Calcium: 8.9 mg/dL (ref 8.9–10.3)
Chloride: 82 mmol/L — ABNORMAL LOW (ref 98–111)
Creatinine, Ser: 1.28 mg/dL — ABNORMAL HIGH (ref 0.44–1.00)
GFR, Estimated: 43 mL/min — ABNORMAL LOW (ref >60)
Glucose, Bld: 96 mg/dL (ref 70–99)
Potassium: 3.6 mmol/L (ref 3.5–5.1)
Sodium: 133 mmol/L — ABNORMAL LOW (ref 135–145)

## 2021-09-03 MED ORDER — FUROSEMIDE 10 MG/ML IJ SOLN
20.0000 mg | Freq: Two times a day (BID) | INTRAMUSCULAR | Status: DC
  Administered 2021-09-03 – 2021-09-05 (×5): 20 mg via INTRAVENOUS
  Filled 2021-09-03 (×5): qty 2

## 2021-09-03 NOTE — Progress Notes (Signed)
PULMONOLOGY         Date: 09/03/2021,   MRN# [id number] Karen [name] 06/19/1945     AdmissionWeight: 93.5 kg                 CurrentWeight: 87.5 kg  Referring provider: Dr. Sheppard Coil   CHIEF COMPLAINT:   Recurrent pleural effusion with acute on chronic hypoxemic respiratory failure   HISTORY OF PRESENT ILLNESS   This is a pleasant 76 year old female with heart failure with preserved EF at 50%, recurrent hypoxemia, CKD, advanced COPD, diabetes, peripheral edema, GI bleeding history of dyslipidemia essential hypertension iron deficiency anemia valvular heart disease history of GI bleeding who came in with chest discomfort x2 days and has been hospitalized already for 14 days initial admission 08/14/2021.  Initially diagnosed with pneumonia status post full scope of therapy for community-acquired pneumonia.  She generally wears about 3 L/min nasal cannula.  On arrival she received IV resuscitative fluids and empiric cefepime and vancomycin noted to have accelerated atrial fibrillation treated with IV diltiazem with mild improvement however development of bilateral large pleural effusion status postthoracentesis.  Despite all this she continues to deteriorate and has had palliative care evaluation with CODE STATUS advance to DNR.  Today during discussion she wishes to continue full scope of therapy at this moment is on noninvasive ventilator with BiPAP.  She had 850 cc straw-colored fluid removed via left thoracentesis 08/16/2021 and same 1 week later CT chest independently reviewed by me with bibasilar atelectasis and compressive atelectasis of the left lung with surrounding effusion as well as pericardial effusion. PCCM consultation for further evaluation and management   08/30/21 - patient remains hypoxemic. Slight improvement on HFNC currently on 15L/min 75% spO2>90%.  S/p Therapeutic Thora >1.1L drained. Repeat CXR today for interval changes post Metaneb recruitment. Remains on  aggressive diuretic regimen with Metolazone, aldactone, torsemide currently 24L net negative. Today further reduced non essential nephrotoxins by reducing protonix to 55m po daily, due to AF have dcd Albuterol and Duoneb and Anoro ellipta inhaler and will use xopenex only q8h PRN for now.  Amiodarone has been dcd appreciate cardiology input. She should try to use BIPAP more instead of HFNC due to hypercapnia which is likely to get worse in context of contraction alkalosis. Diet modified to Renal fluid restricted 1200cc/24h.     08/31/21- patient weaned to 12L [place] from 75% 15L/min HFNC.  25L net negative. Devt of mild AKI, have reduced diuretics to aldactone only due to hypokalemia. Plan to continue metaneb, PT/OT. Minimized meds for renal sparing. Overall improved markedly over past 48h.  Reviewed plan with RT for recruitment due to persistent atelectatic changes.  She is sitting up in chair and appears more comfortable but still very weak and frail.   09/01/21- patient continues to improve. I met with husband today.  Overall long term prognosis is <6 months.  I discussed potential home hospice for her. She is now on 10L/min great improvement over past few days.   09/02/21- patient is improved , down to 5-6L/min.  Renal function is better. CO2 peripherally is improving.  She is doing PT and is slowly improved.  We discussed her prognosis and I recommend to consider home hospice at home. Husband is thinking about it and will let [place]now.   09/03/21- patient remains on 5L/min, shes now 29L net negative.  Slight bump on renal function with reduced GFR and contraction.  She is getting very close to baseline and can  be dcd from pulmonary perspective   PAST MEDICAL HISTORY   Past Medical History:  Diagnosis Date   (HFpEF) heart failure with preserved ejection fraction ([place])    a. 2017 Echo: EF 50%; b. 06/2018 Echo: EF 50-55%; c. 08/2018 Echo: EF 50-55%, Nl RV fxn; d. 09/2019 Echo: EF 55-60%, no rwma, mild LVH,  Gr1 DD, nl RV size/fxn, PASP 63.50mHg. Mildly dil LA. Triv MR. Mod AS (AoV 0.94cm^2 VTI; mean grad 17.356mg); d. 02/2021 Echo: EF 60-65%, no rwma, GrI DD, mildly red RV fxn, RVSP 64.28m3m, mild-mod MR, mod-sev TR, mild AS.   Acute on chronic respiratory failure with hypoxia and hypercapnia (HCC) 01/07/2015   Anemia    Asterixis 01/07/2015   Asthma    Cataract    CKD (chronic kidney disease), stage III (HCC)    COPD (chronic obstructive pulmonary disease) ([place]  a. 06/2018 tobacco use, home 3L oxygen    Diabetes mellitus without complication ([facility]ero  a. 09/2019 A1C 8.8   Edema, peripheral 04/20/2014   GI bleed 06/28/2019   History of kidney stones    Hyperlipidemia    Hypertension    Iron deficiency anemia 06/22/2014   Junctional bradycardia    a. In setting of beta blocker therapy.   Leucocytosis 10/19/2015   Moderate aortic stenosis    a.  09/2019 Echo: Mod AS (AoV 0.94cm^2 VTI; mean grad 17.3mm48m; b. 02/2021 Echo: Mild AS.   Morbid obesity (HCC)[place] Overactive bladder    Primary osteoarthritis of right knee 09/01/2016   Sciatica 01/07/2015   Valvular heart disease    a. 02/2021 Echo: EF 60-65%, no rwma, GrI DD, mild-mod MR, mod-sev TR, mild AS     SURGICAL HISTORY   Past Surgical History:  Procedure Laterality Date   APPENDECTOMY     CESAREAN SECTION     x3   CHOLECYSTECTOMY     COLONOSCOPY WITH PROPOFOL N/A 08/28/2017   Procedure: COLONOSCOPY WITH PROPOFOL;  Surgeon: WohlLucilla Lame;  Location: [facility] ENDOSCOPY;  Service: Endoscopy;  Laterality: N/A;   COLONOSCOPY WITH PROPOFOL N/A 08/29/2017   Procedure: COLONOSCOPY WITH PROPOFOL;  Surgeon: WohlLucilla Lame;  [facility];  Service: Endoscopy;  Laterality: N/A;   CYSTOSCOPY W/ URETERAL STENT PLACEMENT Right 09/15/2017   Procedure: CYSTOSCOPY WITH RETROGRADE PYELOGRAM/URETERAL STENT PLACEMENT;  Surgeon: McKeCleon Gustin;  Location: [facility] ORS;  Service: Urology;  Laterality: Right;    CYSTOSCOPY/URETEROSCOPY/HOLMIUM LASER/STENT PLACEMENT Right 10/09/2017   Procedure: CYSTOSCOPY/URETEROSCOPY/HOLMIUM LASER/STENT PLACEMENT;  Surgeon: StoiAbbie Sons;  Location: [facility] ORS;  Service: Urology;  Laterality: Right;  right Stent exchange   ESOPHAGOGASTRODUODENOSCOPY (EGD) WITH PROPOFOL N/A 09/16/2018   Procedure: ESOPHAGOGASTRODUODENOSCOPY (EGD) WITH PROPOFOL;  Surgeon: VangLin Landsman;  [facility];  Laterality: N/A;   EYE SURGERY     IR THORACENTESIS ASP PLEURAL SPACE W/IMG GUIDE  02/07/2021     FAMILY HISTORY   Family History  Problem Relation Age of Onset   Other Mother        unknown medical history   Other Father        unknown medical history     SOCIAL HISTORY   Social History   Tobacco Use   Smoking status: Former    Packs/day: 1.00    Years: 20.00    Total pack years: 20.00    Types: Cigarettes    Quit date: 12/04/1992    Years since quitting: 28.7   Smokeless tobacco:  Never  Vaping Use   Vaping Use: Never used  Substance Use Topics   Alcohol use: No   Drug use: No     MEDICATIONS    Home Medication:    Current Medication:  Current Facility-Administered Medications:    acetaminophen (TYLENOL) tablet 1,000 mg, 1,000 mg, Oral, BID, Lanney Gins, Ngina Royer, MD, 1,000 mg at 09/02/21 2002   acetaminophen (TYLENOL) tablet 500-1,000 mg, 500-1,000 mg, Oral, Q6H PRN, Norins, Heinz Knuckles, MD, 1,000 mg at 09/03/21 0148   allopurinol (ZYLOPRIM) tablet 100 mg, 100 mg, Oral, Daily, Norins, Heinz Knuckles, MD, 100 mg at 09/02/21 4132   apixaban (ELIQUIS) tablet 5 mg, 5 mg, Oral, BID, Agbor-Etang, Aaron Edelman, MD, 5 mg at 09/02/21 2110   atorvastatin (LIPITOR) tablet 10 mg, 10 mg, Oral, Daily, Norins, Heinz Knuckles, MD, 10 mg at 09/02/21 4401   empagliflozin (JARDIANCE) tablet 10 mg, 10 mg, Oral, Daily, Norins, Heinz Knuckles, MD, 10 mg at 09/02/21 0935   ferrous sulfate tablet 325 mg, 325 mg, Oral, Q breakfast, Norins, Heinz Knuckles, MD, 325  mg at 09/03/21 0829   fluticasone (FLONASE) 50 MCG/ACT nasal spray 2 spray, 2 spray, Each Nare, Daily, Norins, Heinz Knuckles, MD, 2 spray at 08/28/21 0824   insulin aspart (novoLOG) injection 0-20 Units, 0-20 Units, Subcutaneous, TID WC, Norins, Heinz Knuckles, MD, 11 Units at 09/02/21 1701   insulin aspart (novoLOG) injection 12 Units, 12 Units, Subcutaneous, TID WC, Annita Brod, MD, 12 Units at 09/02/21 1701   insulin glargine-yfgn (SEMGLEE) injection 22 Units, 22 Units, Subcutaneous, BID, Sharion Settler, NP, 22 Units at 09/02/21 2111   levalbuterol (XOPENEX) nebulizer solution 0.63 mg, 0.63 mg, Nebulization, Q8H PRN, Ottie Glazier, MD, 0.63 mg at 09/03/21 0831   lidocaine (LIDODERM) 5 % 2 patch, 2 patch, Transdermal, Q24H, Annita Brod, MD, 2 patch at 09/02/21 2112   melatonin tablet 5 mg, 5 mg, Oral, QHS, Norins, Heinz Knuckles, MD, 5 mg at 09/02/21 2110   Oral care mouth rinse, 15 mL, Mouth Rinse, PRN, Emeterio Reeve, DO   oxyCODONE (Oxy IR/ROXICODONE) immediate release tablet 5 mg, 5 mg, Oral, Q6H PRN, Wyvonnia Dusky, MD, 5 mg at 09/03/21 0830   pantoprazole (PROTONIX) EC tablet 20 mg, 20 mg, Oral, Daily, Ottie Glazier, MD, 20 mg at 09/02/21 0935   potassium chloride SA (KLOR-CON M) CR tablet 20 mEq, 20 mEq, Oral, BID, Agbor-Etang, Aaron Edelman, MD, 20 mEq at 09/02/21 2110   senna-docusate (Senokot-S) tablet 1 tablet, 1 tablet, Oral, BID BM PRN, Norins, Heinz Knuckles, MD, 1 tablet at 08/18/21 0272   spironolactone (ALDACTONE) tablet 25 mg, 25 mg, Oral, Daily, Ottie Glazier, MD, 25 mg at 09/02/21 0940   torsemide (DEMADEX) tablet 60 mg, 60 mg, Oral, Daily, End, Christopher, MD, 60 mg at 09/02/21 1404   traZODone (DESYREL) tablet 50 mg, 50 mg, Oral, QHS PRN, Norins, Heinz Knuckles, MD, 50 mg at 08/25/21 2213    ALLERGIES   Ace inhibitors, Beta adrenergic blockers, Gabapentin, Lisinopril, Lyrica [pregabalin], and Shrimp [shellfish allergy]     REVIEW OF SYSTEMS    Review of  Systems:  Gen:  Denies  fever, sweats, chills weigh loss  HEENT: Denies blurred vision, double vision, ear pain, eye pain, hearing loss, nose bleeds, sore throat Cardiac:  No dizziness, chest pain or heaviness, chest tightness,edema Resp:   reports dyspnea chronically  Gi: Denies swallowing difficulty, stomach pain, nausea or vomiting, diarrhea, constipation, bowel incontinence Gu:  Denies bladder incontinence, burning urine Ext:   Denies Joint pain, stiffness  or swelling Skin: Denies  skin rash, easy bruising or bleeding or hives Endoc:  Denies polyuria, polydipsia , polyphagia or weight change Psych:   Denies depression, insomnia or hallucinations   Other:  All other systems negative   VS: BP 112/64 (BP Location: Right Arm)   Pulse 80   Temp 97.6 F (36.4 C) (Oral)   Resp (!) 21   Ht _0  (1.6 m)   Wt 87.5 kg   SpO2 93%   BMI 34.17 kg/m      PHYSICAL EXAM    GENERAL:NAD, no fevers, chills, no weakness no fatigue HEAD: Normocephalic, atraumatic.  EYES: Pupils equal, round, reactive to light. Extraocular muscles intact. No scleral icterus.  MOUTH: Moist mucosal membrane. Dentition intact. No abscess noted.  EAR, NOSE, THROAT: Clear without exudates. No external lesions.  NECK: Supple. No thyromegaly. No nodules. No JVD.  PULMONARY: decreased breath sounds with mild rhonchi worse at bases bilaterally.  CARDIOVASCULAR: S1 and S2. Regular rate and rhythm. No murmurs, rubs, or gallops. No edema. Pedal pulses 2+ bilaterally.  GASTROINTESTINAL: Soft, nontender, nondistended. No masses. Positive bowel sounds. No hepatosplenomegaly.  MUSCULOSKELETAL: No swelling, clubbing, or edema. Range of motion full in all extremities.  NEUROLOGIC: Cranial nerves II through XII are intact. No gross focal neurological deficits. Sensation intact. Reflexes intact.  SKIN: No ulceration, lesions, rashes, or cyanosis. Skin warm and dry. Turgor intact.  PSYCHIATRIC: Mood, affect within normal  limits. The patient is awake, alert and oriented x 3. Insight, judgment intact.       IMAGING     ASSESSMENT/PLAN   Acute on chronic hypoxemic respiratory failure -This is secondary to compressive atelectasis from surrounding moderate pleural effusions bilaterally status post repeat thoracentesis. -Culprit of underlying effusions seems to be CKD with CHF status post IV fluid for initial treatment with presumed sepsis.  -Patient is currently on multiple diuretics with relative hypotension precluding from making adequate perfusion and diuresis. -We discussed Pleurx catheter momentarily and she wishes for additional discussion with her family as well as review of cost and management. -I have changed some of her medication we discussed reducing narcotics and other nonessential nephrotoxins for now. -Due to relative hypotension will start amiodarone and hold Cardizem to allow [place] to diurese her a little better -She will need recruitment maneuvers with MetaNeb device to help wean down oxygen -decreased diuresis due to AKI -reviewed ABG with metabolic alkalosis due to contraction     Bilateral compressive atelectasis Chest physiotherapy with recruitment maneuvers utilizing MetaNeb            Thank you for allowing me to participate in the care of this patient.   Patient/Family are satisfied with care plan and all questions have been answered.    Provider disclosure: Patient with at least one acute or chronic illness or injury that poses a threat to life or bodily function and is being managed actively during this encounter.  All of the below services have been performed independently by signing provider:  review of prior documentation from internal and or external health records.  Review of previous and current lab results.  Interview and comprehensive assessment during patient visit today. Review of current and previous chest radiographs/CT scans. Discussion of management and test  interpretation with health care team and patient/family.   This document was prepared using Dragon voice recognition software and may include unintentional dictation errors.     Ottie Glazier, M.D.  Division of Pulmonary & Critical Care Medicine

## 2021-09-03 NOTE — Progress Notes (Signed)
[facility] Progress Note  Patient: Karen [name]    GBT:[id number]  DOA: 08/14/2021    Date of Service: the patient was seen and examined on 09/03/2021  Brief hospital course: 76 year old female with past medical history of diastolic heart failure, stage IV chronic kidney disease, COPD, diabetes mellitus and hypertension with recent hospitalization discharged to skilled nursing on 6/7 who presented back to emergency room on 6/11 for left-sided chest pain that had started 2 days prior.  At time of admission, patient treated for sepsis secondary to pneumonia and placed on broad-spectrum antibiotics as well as found to have atrial fibrillation with rapid ventricular rate and acute on chronic diastolic heart failure.  Patient since admission has needed high flow oxygen/BiPAP.  Since admission, patient has required numerous thoracenteses.  She required BiPAP at times, but then has refused, leading to episodes of decompensation.  Over the course of the past week, patient's oxygenation has steadily improved going from continuous BiPAP to intermittent BiPAP with high flow nasal cannula from 15 L and has been able to get down to as low as 5 L.  Palliative care following.  Assessment and Plan: Assessment and Plan: * Acute on chronic respiratory failure with hypoxia ([place]) Patient on 3L O2 at home. ON presentation she was mildly hypoxemic 2/2 CHF and PNA, with recurrent pleural effusions and overall decline, has required BiPAP and high flow oxygen.  She is actually had some improvement over the past few days going from 15 L down to as low as 5 L on 6/30.  In part due to noncompliance with BiPAP.  Despite dramatic improvement, overall she has such advanced severe disease she has limited life expectancy likely of 6 months at best.    Oxygenation a little worse today.  Patient is always at high risk for deterioration.  Today requiring 6 to 7 L.  Her Lasix had been stopped several days ago due to worsening renal  failure  Pulmonary hypertension, unspecified ([place]) Contributing to pulmonary issues  Acute on chronic heart failure with preserved ejection fraction (HFpEF) ([place]) Last Echo 08/04/21 revealing grade III DD, tricuspid regurg. She now presents with pulmonary edema, increased SOB.  Has diuresed over 18 L and is almost - 15 L deficient.  Her weight has dropped approximately 10 pounds.  By holding her Lasix, already she has started to trend back up.  We will try to restart gently.   COPD (chronic obstructive pulmonary disease) ([place]) Continue home regimen  Sepsis due to pneumonia (HCC)-resolved as of 08/31/2021 Resolved.  Met criteria for sepsis on admission given tachypnea, tachycardia and pulmonary source.  Sepsis stabilized and completed antibiotic course.  HAP (hospital-acquired pneumonia) To hospital this admission w/ sepsis - resolved Treatment completed  OSA (obstructive sleep apnea) Will use CPAP  Acute kidney injury superimposed on CKD (HCC) Creatinine around 1.05 and has been stable.  With diuresis creatinine had increased to 1.37 and diuretics held, and therefore creatinine continues to improve.  We will try restarting her Lasix given above issues.  Permanent atrial fibrillation (HCC) Continue Eliquis.  For the most part rate controlled and as her oxygenation improves, heart rate should come down  Uncontrolled type 2 diabetes mellitus with hyperglycemia, with long-term current use of insulin ([place]) Patient with very high glucose. She is on basal insulin, dapaglifozin. Patient seem to have spiking blood sugars at meals.  Appreciate diabetes coordinator help continue to increase scheduled meal coverage  Hyponatremia Chronic recurring problem.  Currently at 132   Hypokalemia  Chronic recurring problem. Diuretics a contributing cause.  Replace as needed  Gastroesophageal reflux disease without esophagitis Continue PPI tx       Body mass index is 34.17 kg/m.         Consultants: Pulmonary Cardiology Palliative care  Procedures: Echocardiogram done 6/17 noting preserved ejection fraction  Antimicrobials: Completed antibiotic course for sepsis/HCAP  Code Status: DNR   Subjective: Still somewhat back pain.  Denies outright shortness of breath, but does complain of being winded very easily  Objective: Vital signs were reviewed and unremarkable. Vitals:   09/03/21 1200 09/03/21 1300  BP:    Pulse: 98 94  Resp: 17 (!) 21  Temp:    SpO2: 97% 96%    Intake/Output Summary (Last 24 hours) at 09/03/2021 1351 Last data filed at 09/03/2021 1300 Gross per 24 hour  Intake 1320 ml  Output 3375 ml  Net -2055 ml    Filed Weights   09/01/21 0500 09/02/21 0331 09/03/21 0510  Weight: 90.3 kg 88.7 kg 87.5 kg   Body mass index is 34.17 kg/m.  Exam:  General: Alert and oriented x3, no acute distress HEENT: Normocephalic and atraumatic, mucous membranes are moist Cardiovascular: Irregular rhythm, rate controlled Respiratory: Decreased breath sounds throughout, breathing slightly labored Abdomen: Soft, nontender, nondistended, positive bowel sounds Musculoskeletal: No clubbing or cyanosis or edema Skin: No skin breaks, tears or lesions Psychiatry: Appropriate, no evidence of psychoses Neurology: No focal deficits  Data Reviewed: Creatinine up to 1.28  Disposition:  Status is: Inpatient Remains inpatient appropriate because: Further improvement in hypoxia, as best we can Patient amenable to skilled nursing versus home with home hospice, likely the latter  Anticipated discharge date: 7/3  Family Communication: Husband on phone DVT Prophylaxis: Place and maintain sequential compression device Start: 08/17/21 1311 apixaban (ELIQUIS) tablet 5 mg    Author: Annita Brod ,MD 09/03/2021 1:51 PM  To reach On-call, see care teams to locate the attending and reach out via [email or web address]. Between 7PM-7AM, please contact night-coverage If  you still have difficulty reaching the attending provider, please page the [facility] (Director on Call) for [facility] on amion for assistance.

## 2021-09-03 NOTE — Progress Notes (Signed)
Progress Note  Patient Name: Karen [name] Date of Encounter: 09/03/2021  [facility] Cardiologist: Ida Rogue, MD   Subjective   Breathing slowly improving. Debating home hospice at discharge. Per husband, they would like to be at home as much as possible. He is also asking if there is a service like meals on wheels, but one that will make her diabetic/low sodium meals.  Inpatient Medications    Scheduled Meds:  acetaminophen  1,000 mg Oral BID   allopurinol  100 mg Oral Daily   apixaban  5 mg Oral BID   atorvastatin  10 mg Oral Daily   empagliflozin  10 mg Oral Daily   ferrous sulfate  325 mg Oral Q breakfast   fluticasone  2 spray Each Nare Daily   insulin aspart  0-20 Units Subcutaneous TID WC   insulin aspart  12 Units Subcutaneous TID WC   insulin glargine-yfgn  22 Units Subcutaneous BID   lidocaine  2 patch Transdermal Q24H   melatonin  5 mg Oral QHS   pantoprazole  20 mg Oral Daily   potassium chloride  20 mEq Oral BID   spironolactone  25 mg Oral Daily   torsemide  60 mg Oral Daily   Continuous Infusions:  PRN Meds: acetaminophen, levalbuterol, mouth rinse, oxyCODONE, senna-docusate, traZODone   Vital Signs    Vitals:   09/03/21 1100 09/03/21 1119 09/03/21 1200 09/03/21 1300  BP:  (!) 108/58    Pulse: 80 82 98 94  Resp: 19 20 17  (!) 21  Temp:  98 F (36.7 C)    TempSrc:  Oral    SpO2: 99% 97% 97% 96%  Weight:      Height:        Intake/Output Summary (Last 24 hours) at 09/03/2021 1334 Last data filed at 09/03/2021 1300 Gross per 24 hour  Intake 1320 ml  Output 3375 ml  Net -2055 ml      09/03/2021    5:10 AM 09/02/2021    3:31 AM 09/01/2021    5:00 AM  Last 3 Weights  Weight (lbs) 192 lb 14.4 oz 195 lb 8.8 oz 199 lb 1.2 oz  Weight (kg) 87.499 kg 88.7 kg 90.3 kg      Telemetry    Went back into rate controlled afib just before noon today - Personally Reviewed  ECG    No new tracing.  Physical Exam   GEN: Well nourished, well  developed in no acute distress. O2 by [place]netta North in place NECK: No JVD appreciated but difficult body habitus CARDIAC: irregularly irregular rhythm, normal S1 and S2, no rubs or gallops. 2/6 systolic murmur. VASCULAR: Radial pulses 2+ bilaterally.  RESPIRATORY:  diffusely diminished, rales at bases ABDOMEN: Soft, non-tender, non-distended MUSCULOSKELETAL:  Moves all 4 limbs independently SKIN: Warm and dry, trivial bilateral LE edema NEUROLOGIC:  No focal neuro deficits noted. PSYCHIATRIC:  Normal affect    Labs    High Sensitivity Troponin:   Recent Labs  Lab 08/14/21 1518 08/14/21 1653  TROPONINIHS 16 12     Chemistry Recent Labs  Lab 08/29/21 0447 08/31/21 0538 09/01/21 0545 09/02/21 0522 09/03/21 0553  NA 133* 134* 132* 132* 133*  K 4.3 3.0* 3.5 3.5 3.6  CL 78* 75* 77* 80* 82*  CO2 40* >45* 42* 39* 39*  GLUCOSE 128* 131* 134* 141* 96  BUN 41* 59* 63* 54* 59*  CREATININE 1.08* 1.37* 1.30* 1.15* 1.28*  CALCIUM 9.1 9.1 8.9 9.2 8.9  MG 2.1  --   --   --   --  PROT  --  6.8  --   --   --   ALBUMIN  --  2.7*  --   --   --   AST  --  14*  --   --   --   ALT  --  13  --   --   --   ALKPHOS  --  72  --   --   --   BILITOT  --  0.8  --   --   --   GFRNONAA 53* 40* 42* 49* 43*  ANIONGAP 15 NOT CALCULATED 13 13 12     Lipids No results for input(s): "CHOL", "TRIG", "HDL", "LABVLDL", "LDLCALC", "CHOLHDL" in the last 168 hours.  Hematology Recent Labs  Lab 08/31/21 0538 09/01/21 0545 09/02/21 0522  WBC 16.5* 16.3* 12.7*  RBC 4.03 3.95 4.01  HGB 11.0* 10.8* 10.8*  HCT 34.2* 33.7* 34.1*  MCV 84.9 85.3 85.0  MCH 27.3 27.3 26.9  MCHC 32.2 32.0 31.7  RDW 14.7 14.6 14.4  PLT 365 391 391   Thyroid No results for input(s): "TSH", "FREET4" in the last 168 hours.  BNPNo results for input(s): "BNP", "PROBNP" in the last 168 hours.  DDimer No results for input(s): "DDIMER" in the last 168 hours.   Radiology    No results found.  Cardiac Studies   TTE (08/25/2021):  1. Left  ventricular ejection fraction, by estimation, is 60 to 65%. The  left ventricle has normal function. The left ventricle has no regional  wall motion abnormalities. Left ventricular diastolic parameters are  indeterminate.   2. Right ventricular systolic function is mildly reduced. The right  ventricular size is mildly enlarged. There is moderately elevated  pulmonary artery systolic pressure. The estimated right ventricular  systolic pressure is 95.3 mmHg.   3. Moderate pleural effusion in the left lateral region estimated 6 cm.   4. The mitral valve is normal in structure. No evidence of mitral valve  regurgitation. No evidence of mitral stenosis.   5. Tricuspid valve regurgitation is moderate.   6. The aortic valve was not well visualized. Aortic valve regurgitation  is not visualized. No aortic stenosis is present.   7. The inferior vena cava is dilated in size with >50% respiratory  variability, suggesting right atrial pressure of 8 mmHg.   Patient Profile     76 y.o. female with history of HFpEF, COPD, obesity, respiratory failure, oxygen presenting with shortness of breath and edema, diagnosed with pneumonia and respiratory failure, requiring pleural effusions requiring thoracentesis x2 being seen for A-fib RVR and HFpEF.  Assessment & Plan    HFpEF and severe pulmonary hypertension Acute on chronic respiratory failure with hypoxia and COPD: -continue torsemide 60 mg daily and spironolatone 25 mg daily. Required BID dosing and metolazone as an outpatient -O2 requirement improving, per primary team and pulmonary. -per husband, they are deciding on home hospice post discharge. -most symptomatic improvement noted after thoracentesis  Atrial fibrillation/flutter, paroxysmal -returned to afib vs coarse flutter just before noon today -currently rate controlled on no agents -Continue apixaban. CHA2DS2/VAS Stroke Risk Points= 7  -Per pulmonology note, recommended amiodarone. With her  underlying lung disease, this is not ideal but may be required if hypotension does not permit calcium channel blocker -for now, she is rate controlled on no agents. Would restart low dose long acting diltiazem first if rate control agent needed.   [facility] will sign off.  Please contact us with any new questions or concerns  Medication Recommendations:  as currently ordered Other recommendations (labs, testing, etc):  none Follow up as an outpatient:  She has an appt with Darylene Price on 09/16/21   For questions or updates, please contact [place] Please consult [email or web address] for contact info under [facility].     Signed, Buford Dresser, MD  09/03/2021, 1:34 PM

## 2021-09-04 DIAGNOSIS — N179 Acute kidney failure, unspecified: Secondary | ICD-10-CM | POA: Diagnosis not present

## 2021-09-04 DIAGNOSIS — J9621 Acute and chronic respiratory failure with hypoxia: Secondary | ICD-10-CM | POA: Diagnosis not present

## 2021-09-04 DIAGNOSIS — I5033 Acute on chronic diastolic (congestive) heart failure: Secondary | ICD-10-CM | POA: Diagnosis not present

## 2021-09-04 DIAGNOSIS — G4733 Obstructive sleep apnea (adult) (pediatric): Secondary | ICD-10-CM | POA: Diagnosis not present

## 2021-09-04 LAB — BASIC METABOLIC PANEL
Anion gap: 9 (ref 5–15)
BUN: 65 mg/dL — ABNORMAL HIGH (ref 8–23)
CO2: 36 mmol/L — ABNORMAL HIGH (ref 22–32)
Calcium: 8.6 mg/dL — ABNORMAL LOW (ref 8.9–10.3)
Chloride: 84 mmol/L — ABNORMAL LOW (ref 98–111)
Creatinine, Ser: 1.19 mg/dL — ABNORMAL HIGH (ref 0.44–1.00)
GFR, Estimated: 47 mL/min — ABNORMAL LOW (ref >60)
Glucose, Bld: 112 mg/dL — ABNORMAL HIGH (ref 70–99)
Potassium: 4 mmol/L (ref 3.5–5.1)
Sodium: 129 mmol/L — ABNORMAL LOW (ref 135–145)

## 2021-09-04 LAB — GLUCOSE, CAPILLARY
Glucose-Capillary: 132 mg/dL — ABNORMAL HIGH (ref 70–99)
Glucose-Capillary: 282 mg/dL — ABNORMAL HIGH (ref 70–99)
Glucose-Capillary: 125 mg/dL — ABNORMAL HIGH (ref 70–99)
Glucose-Capillary: 212 mg/dL — ABNORMAL HIGH (ref 70–99)

## 2021-09-04 NOTE — Progress Notes (Signed)
[facility] Progress Note  Patient: Karen [name]    VHQ:[id number]  DOA: 08/14/2021    Date of Service: the patient was seen and examined on 09/04/2021  Brief hospital course: 76 year old female with past medical history of diastolic heart failure, stage IV chronic kidney disease, COPD, diabetes mellitus and hypertension with recent hospitalization discharged to skilled nursing on 6/7 who presented back to emergency room on 6/11 for left-sided chest pain that had started 2 days prior.  At time of admission, patient treated for sepsis secondary to pneumonia and placed on broad-spectrum antibiotics as well as found to have atrial fibrillation with rapid ventricular rate and acute on chronic diastolic heart failure.  Patient since admission has needed high flow oxygen/BiPAP.  Since admission, patient has required numerous thoracenteses.  She required BiPAP at times, but then has refused, leading to episodes of decompensation.  Over the course of the past week, patient's oxygenation has steadily improved going from continuous BiPAP to intermittent BiPAP with high flow nasal cannula from 15 L and has been able to get down to as low as 4 L.  Palliative care following.  Assessment and Plan: Assessment and Plan: * Acute on chronic respiratory failure with hypoxia ([place]) Patient on 3L O2 at home. ON presentation she was mildly hypoxemic 2/2 CHF and PNA, with recurrent pleural effusions and overall decline, has required BiPAP and high flow oxygen.  She is actually had some improvement over the past few days going from 15 L down to as low as 4 L over the last 24 hours.  In part due to noncompliance with BiPAP.  Despite dramatic improvement, overall she has such advanced severe disease she has limited life expectancy likely of 6 months at best.     Pulmonary hypertension, unspecified ([place]) Contributing to pulmonary issues  Acute on chronic heart failure with preserved ejection fraction (HFpEF) ([place]) Last  Echo 08/04/21 revealing grade III DD, tricuspid regurg. She now presents with pulmonary edema, increased SOB.  Has diuresed over 18 L and is almost - 15 L deficient.  Her weight has dropped approximately 10 pounds.  By holding her Lasix, already she has started to trend back up.  Lasix restarted on 7/1 and patient since then has had further diuresis and oxygenation continues to improve.  COPD (chronic obstructive pulmonary disease) ([place]) Continue home regimen  Sepsis due to pneumonia (HCC)-resolved as of 08/31/2021 Resolved.  Met criteria for sepsis on admission given tachypnea, tachycardia and pulmonary source.  Sepsis stabilized and completed antibiotic course.  HAP (hospital-acquired pneumonia) To hospital this admission w/ sepsis - resolved Treatment completed  OSA (obstructive sleep apnea) Will use CPAP  Acute kidney injury superimposed on CKD (HCC) Creatinine around 1.05 and has been stable.  With diuresis creatinine had increased to 1.37 and diuretics held, and therefore creatinine continues to improve.  Restarted Lasix given slightly worsening hypoxia on 7/1 and creatinine today actually down to 1.19.  Permanent atrial fibrillation (HCC) Continue Eliquis.  For the most part rate controlled and as her oxygenation improves, heart rate should come down  Uncontrolled type 2 diabetes mellitus with hyperglycemia, with long-term current use of insulin ([place]) Patient with very high glucose. She is on basal insulin, dapaglifozin. Patient seem to have spiking blood sugars at meals.  Appreciate diabetes coordinator help continue to increase scheduled meal coverage  Hyponatremia Chronic recurring problem.  Currently at 132   Hypokalemia Chronic recurring problem. Diuretics a contributing cause.  Replace as needed  Gastroesophageal reflux disease without  esophagitis Continue PPI tx       Body mass index is 34.17 kg/m.        Consultants: Pulmonary Cardiology Palliative  care  Procedures: Echocardiogram done 6/17 noting preserved ejection fraction  Antimicrobials: Completed antibiotic course for sepsis/HCAP  Code Status: DNR   Subjective: Feeling like breathing is okay. Objective: Vital signs were reviewed and unremarkable. Vitals:   09/04/21 1100 09/04/21 1110  BP:  123/63  Pulse: 87 88  Resp:  15  Temp:  97.7 F (36.5 C)  SpO2: 90% 96%    Intake/Output Summary (Last 24 hours) at 09/04/2021 1402 Last data filed at 09/04/2021 1254 Gross per 24 hour  Intake 717 ml  Output 1150 ml  Net -433 ml    Filed Weights   09/01/21 0500 09/02/21 0331 09/03/21 0510  Weight: 90.3 kg 88.7 kg 87.5 kg   Body mass index is 34.17 kg/m.  Exam:  General: Alert and oriented x3, no acute distress HEENT: Normocephalic and atraumatic, mucous membranes are moist Cardiovascular: Irregular rhythm, rate controlled Respiratory: Decreased breath sounds throughout, breathing less labored today Abdomen: Soft, nontender, nondistended, positive bowel sounds Musculoskeletal: No clubbing or cyanosis or edema Skin: No skin breaks, tears or lesions Psychiatry: Appropriate, no evidence of psychoses Neurology: No focal deficits  Data Reviewed: Creatinine up to 1.28  Disposition:  Status is: Inpatient Remains inpatient appropriate because: Further improvement in hypoxia, as best we can Patient amenable to skilled nursing versus home with home hospice,  Anticipated discharge date: 7/5  Family Communication: Husband at the bedside DVT Prophylaxis: Place and maintain sequential compression device Start: 08/17/21 1311 apixaban (ELIQUIS) tablet 5 mg    Author: Annita Brod ,MD 09/04/2021 2:02 PM  To reach On-call, see care teams to locate the attending and reach out via [email or web address]. Between 7PM-7AM, please contact night-coverage If you still have difficulty reaching the attending provider, please page the [facility] (Director on Call) for [facility] on  amion for assistance.

## 2021-09-05 DIAGNOSIS — N179 Acute kidney failure, unspecified: Secondary | ICD-10-CM | POA: Diagnosis not present

## 2021-09-05 DIAGNOSIS — I5033 Acute on chronic diastolic (congestive) heart failure: Secondary | ICD-10-CM | POA: Diagnosis not present

## 2021-09-05 DIAGNOSIS — G4733 Obstructive sleep apnea (adult) (pediatric): Secondary | ICD-10-CM | POA: Diagnosis not present

## 2021-09-05 DIAGNOSIS — J9621 Acute and chronic respiratory failure with hypoxia: Secondary | ICD-10-CM | POA: Diagnosis not present

## 2021-09-05 LAB — BASIC METABOLIC PANEL
Anion gap: 11 (ref 5–15)
BUN: 62 mg/dL — ABNORMAL HIGH (ref 8–23)
CO2: 38 mmol/L — ABNORMAL HIGH (ref 22–32)
Calcium: 9.3 mg/dL (ref 8.9–10.3)
Chloride: 85 mmol/L — ABNORMAL LOW (ref 98–111)
Creatinine, Ser: 1.37 mg/dL — ABNORMAL HIGH (ref 0.44–1.00)
GFR, Estimated: 40 mL/min — ABNORMAL LOW (ref >60)
Glucose, Bld: 163 mg/dL — ABNORMAL HIGH (ref 70–99)
Potassium: 4.6 mmol/L (ref 3.5–5.1)
Sodium: 134 mmol/L — ABNORMAL LOW (ref 135–145)

## 2021-09-05 LAB — GLUCOSE, CAPILLARY
Glucose-Capillary: 198 mg/dL — ABNORMAL HIGH (ref 70–99)
Glucose-Capillary: 310 mg/dL — ABNORMAL HIGH (ref 70–99)
Glucose-Capillary: 330 mg/dL — ABNORMAL HIGH (ref 70–99)
Glucose-Capillary: 159 mg/dL — ABNORMAL HIGH (ref 70–99)
Glucose-Capillary: 91 mg/dL (ref 70–99)

## 2021-09-05 MED ORDER — TORSEMIDE 20 MG PO TABS
60.0000 mg | ORAL_TABLET | Freq: Two times a day (BID) | ORAL | Status: DC
  Administered 2021-09-06 – 2021-09-07 (×3): 60 mg via ORAL
  Filled 2021-09-05 (×3): qty 3

## 2021-09-05 NOTE — Progress Notes (Signed)
PULMONOLOGY         Date: 09/05/2021,   MRN# [id number] Karen [name] 21-Oct-1945     AdmissionWeight: 93.5 kg                 CurrentWeight: 87.5 kg  Referring provider: Dr. Sheppard Coil   CHIEF COMPLAINT:   Recurrent pleural effusion with acute on chronic hypoxemic respiratory failure   HISTORY OF PRESENT ILLNESS   This is a pleasant 76 year old female with heart failure with preserved EF at 50%, recurrent hypoxemia, CKD, advanced COPD, diabetes, peripheral edema, GI bleeding history of dyslipidemia essential hypertension iron deficiency anemia valvular heart disease history of GI bleeding who came in with chest discomfort x2 days and has been hospitalized already for 14 days initial admission 08/14/2021.  Initially diagnosed with pneumonia status post full scope of therapy for community-acquired pneumonia.  She generally wears about 3 L/min nasal cannula.  On arrival she received IV resuscitative fluids and empiric cefepime and vancomycin noted to have accelerated atrial fibrillation treated with IV diltiazem with mild improvement however development of bilateral large pleural effusion status postthoracentesis.  Despite all this she continues to deteriorate and has had palliative care evaluation with CODE STATUS advance to DNR.  Today during discussion she wishes to continue full scope of therapy at this moment is on noninvasive ventilator with BiPAP.  She had 850 cc straw-colored fluid removed via left thoracentesis 08/16/2021 and same 1 week later CT chest independently reviewed by me with bibasilar atelectasis and compressive atelectasis of the left lung with surrounding effusion as well as pericardial effusion. PCCM consultation for further evaluation and management   08/30/21 - patient remains hypoxemic. Slight improvement on HFNC currently on 15L/min 75% spO2>90%.  S/p Therapeutic Thora >1.1L drained. Repeat CXR today for interval changes post Metaneb recruitment. Remains on  aggressive diuretic regimen with Metolazone, aldactone, torsemide currently 24L net negative. Today further reduced non essential nephrotoxins by reducing protonix to 68m po daily, due to AF have dcd Albuterol and Duoneb and Anoro ellipta inhaler and will use xopenex only q8h PRN for now.  Amiodarone has been dcd appreciate cardiology input. She should try to use BIPAP more instead of HFNC due to hypercapnia which is likely to get worse in context of contraction alkalosis. Diet modified to Renal fluid restricted 1200cc/24h.     08/31/21- patient weaned to 12L [place] from 75% 15L/min HFNC.  25L net negative. Devt of mild AKI, have reduced diuretics to aldactone only due to hypokalemia. Plan to continue metaneb, PT/OT. Minimized meds for renal sparing. Overall improved markedly over past 48h.  Reviewed plan with RT for recruitment due to persistent atelectatic changes.  She is sitting up in chair and appears more comfortable but still very weak and frail.   09/01/21- patient continues to improve. I met with husband today.  Overall long term prognosis is <6 months.  I discussed potential home hospice for her. She is now on 10L/min great improvement over past few days.   09/02/21- patient is improved , down to 5-6L/min.  Renal function is better. CO2 peripherally is improving.  She is doing PT and is slowly improved.  We discussed her prognosis and I recommend to consider home hospice at home. Husband is thinking about it and will let u[place]now.   09/03/21- patient remains on 5L/min, shes now 29L net negative.  Slight bump on renal function with reduced GFR and contraction.  She is getting very close to baseline and can  be dcd from pulmonary perspective  09/05/21- patient is in no distress on 5L/min [place].  She is close to baseline and from pulmonology perspective can initiate dc planning.    PAST MEDICAL HISTORY   Past Medical History:  Diagnosis Date   (HFpEF) heart failure with preserved ejection fraction ([place])     a. 2017 Echo: EF 50%; b. 06/2018 Echo: EF 50-55%; c. 08/2018 Echo: EF 50-55%, Nl RV fxn; d. 09/2019 Echo: EF 55-60%, no rwma, mild LVH, Gr1 DD, nl RV size/fxn, PASP 63.61mHg. Mildly dil LA. Triv MR. Mod AS (AoV 0.94cm^2 VTI; mean grad 17.378mg); d. 02/2021 Echo: EF 60-65%, no rwma, GrI DD, mildly red RV fxn, RVSP 64.46m21m, mild-mod MR, mod-sev TR, mild AS.   Acute on chronic respiratory failure with hypoxia and hypercapnia (HCC) 01/07/2015   Anemia    Asterixis 01/07/2015   Asthma    Cataract    CKD (chronic kidney disease), stage III (HCC)    COPD (chronic obstructive pulmonary disease) ([place]  a. 06/2018 tobacco use, home 3L oxygen    Diabetes mellitus without complication ([place]  a. 09/2019 A1C 8.8   Edema, peripheral 04/20/2014   GI bleed 06/28/2019   History of kidney stones    Hyperlipidemia    Hypertension    Iron deficiency anemia 06/22/2014   Junctional bradycardia    a. In setting of beta blocker therapy.   Leucocytosis 10/19/2015   Moderate aortic stenosis    a.  09/2019 Echo: Mod AS (AoV 0.94cm^2 VTI; mean grad 17.3mm31m; b. 02/2021 Echo: Mild AS.   Morbid obesity (HCC)[place] Overactive bladder    Primary osteoarthritis of right knee 09/01/2016   Sciatica 01/07/2015   Valvular heart disease    a. 02/2021 Echo: EF 60-65%, no rwma, GrI DD, mild-mod MR, mod-sev TR, mild AS     SURGICAL HISTORY   Past Surgical History:  Procedure Laterality Date   APPENDECTOMY     CESAREAN SECTION     x3   CHOLECYSTECTOMY     COLONOSCOPY WITH PROPOFOL N/A 08/28/2017   Procedure: COLONOSCOPY WITH PROPOFOL;  Surgeon: WohlLucilla Lame;  Location: [facility] ENDOSCOPY;  Service: Endoscopy;  Laterality: N/A;   COLONOSCOPY WITH PROPOFOL N/A 08/29/2017   Procedure: COLONOSCOPY WITH PROPOFOL;  Surgeon: WohlLucilla Lame;  [facility];  Service: Endoscopy;  Laterality: N/A;   CYSTOSCOPY W/ URETERAL STENT PLACEMENT Right 09/15/2017   Procedure: CYSTOSCOPY WITH RETROGRADE PYELOGRAM/URETERAL  STENT PLACEMENT;  Surgeon: McKeCleon Gustin;  Location: [facility] ORS;  Service: Urology;  Laterality: Right;   CYSTOSCOPY/URETEROSCOPY/HOLMIUM LASER/STENT PLACEMENT Right 10/09/2017   Procedure: CYSTOSCOPY/URETEROSCOPY/HOLMIUM LASER/STENT PLACEMENT;  Surgeon: StoiAbbie Sons;  Location: [facility] ORS;  Service: Urology;  Laterality: Right;  right Stent exchange   ESOPHAGOGASTRODUODENOSCOPY (EGD) WITH PROPOFOL N/A 09/16/2018   Procedure: ESOPHAGOGASTRODUODENOSCOPY (EGD) WITH PROPOFOL;  Surgeon: VangLin Landsman;  [facility];  Laterality: N/A;   EYE SURGERY     IR THORACENTESIS ASP PLEURAL SPACE W/IMG GUIDE  02/07/2021     FAMILY HISTORY   Family History  Problem Relation Age of Onset   Other Mother        unknown medical history   Other Father        unknown medical history     SOCIAL HISTORY   Social History   Tobacco Use   Smoking status: Former    Packs/day: 1.00    Years: 20.00    Total  pack years: 20.00    Types: Cigarettes    Quit date: 12/04/1992    Years since quitting: 28.7   Smokeless tobacco: Never  Vaping Use   Vaping Use: Never used  Substance Use Topics   Alcohol use: No   Drug use: No     MEDICATIONS    Home Medication:    Current Medication:  Current Facility-Administered Medications:    acetaminophen (TYLENOL) tablet 1,000 mg, 1,000 mg, Oral, BID, Lanney Gins, Willett Lefeber, MD, 1,000 mg at 09/05/21 0086   acetaminophen (TYLENOL) tablet 500-1,000 mg, 500-1,000 mg, Oral, Q6H PRN, Norins, Heinz Knuckles, MD, 1,000 mg at 09/03/21 0148   allopurinol (ZYLOPRIM) tablet 100 mg, 100 mg, Oral, Daily, Norins, Heinz Knuckles, MD, 100 mg at 09/05/21 7619   apixaban (ELIQUIS) tablet 5 mg, 5 mg, Oral, BID, Agbor-Etang, Brian, MD, 5 mg at 09/05/21 0839   atorvastatin (LIPITOR) tablet 10 mg, 10 mg, Oral, Daily, Norins, Heinz Knuckles, MD, 10 mg at 09/05/21 5093   empagliflozin (JARDIANCE) tablet 10 mg, 10 mg, Oral, Daily, Norins, Heinz Knuckles,  MD, 10 mg at 09/05/21 2671   ferrous sulfate tablet 325 mg, 325 mg, Oral, Q breakfast, Norins, Heinz Knuckles, MD, 325 mg at 09/05/21 0839   fluticasone (FLONASE) 50 MCG/ACT nasal spray 2 spray, 2 spray, Each Nare, Daily, Norins, Heinz Knuckles, MD, 2 spray at 08/28/21 0824   furosemide (LASIX) injection 20 mg, 20 mg, Intravenous, BID, Gevena Barre K, MD, 20 mg at 09/05/21 0840   insulin aspart (novoLOG) injection 0-20 Units, 0-20 Units, Subcutaneous, TID WC, Norins, Heinz Knuckles, MD, 4 Units at 09/05/21 0841   insulin aspart (novoLOG) injection 12 Units, 12 Units, Subcutaneous, TID WC, Annita Brod, MD, 12 Units at 09/05/21 0840   insulin glargine-yfgn (SEMGLEE) injection 22 Units, 22 Units, Subcutaneous, BID, Sharion Settler, NP, 22 Units at 09/05/21 0841   levalbuterol (XOPENEX) nebulizer solution 0.63 mg, 0.63 mg, Nebulization, Q8H PRN, Lanney Gins, Taraann Olthoff, MD, 0.63 mg at 09/05/21 0812   lidocaine (LIDODERM) 5 % 2 patch, 2 patch, Transdermal, Q24H, Annita Brod, MD, 2 patch at 09/04/21 2105   melatonin tablet 5 mg, 5 mg, Oral, QHS, Norins, Heinz Knuckles, MD, 5 mg at 09/04/21 2106   Oral care mouth rinse, 15 mL, Mouth Rinse, PRN, Emeterio Reeve, DO   oxyCODONE (Oxy IR/ROXICODONE) immediate release tablet 5 mg, 5 mg, Oral, Q6H PRN, Wyvonnia Dusky, MD, 5 mg at 09/05/21 1030   pantoprazole (PROTONIX) EC tablet 20 mg, 20 mg, Oral, Daily, Lanney Gins, Jasimine Simms, MD, 20 mg at 09/05/21 0839   potassium chloride SA (KLOR-CON M) CR tablet 20 mEq, 20 mEq, Oral, BID, Agbor-Etang, Aaron Edelman, MD, 20 mEq at 09/05/21 0839   senna-docusate (Senokot-S) tablet 1 tablet, 1 tablet, Oral, BID BM PRN, Norins, Heinz Knuckles, MD, 1 tablet at 08/18/21 2458   spironolactone (ALDACTONE) tablet 25 mg, 25 mg, Oral, Daily, Lanney Gins, Marabelle Cushman, MD, 25 mg at 09/05/21 0998   torsemide (DEMADEX) tablet 60 mg, 60 mg, Oral, Daily, End, Christopher, MD, 60 mg at 09/05/21 0839   traZODone (DESYREL) tablet 50 mg, 50 mg, Oral, QHS PRN, Norins,  Heinz Knuckles, MD, 50 mg at 09/04/21 2107    ALLERGIES   Ace inhibitors, Beta adrenergic blockers, Gabapentin, Lisinopril, Lyrica [pregabalin], and Shrimp [shellfish allergy]     REVIEW OF SYSTEMS    Review of Systems:  Gen:  Denies  fever, sweats, chills weigh loss  HEENT: Denies blurred vision, double vision, ear pain, eye pain, hearing loss, nose bleeds, sore  throat Cardiac:  No dizziness, chest pain or heaviness, chest tightness,edema Resp:   reports dyspnea chronically  Gi: Denies swallowing difficulty, stomach pain, nausea or vomiting, diarrhea, constipation, bowel incontinence Gu:  Denies bladder incontinence, burning urine Ext:   Denies Joint pain, stiffness or swelling Skin: Denies  skin rash, easy bruising or bleeding or hives Endoc:  Denies polyuria, polydipsia , polyphagia or weight change Psych:   Denies depression, insomnia or hallucinations   Other:  All other systems negative   VS: BP (!) 115/59 (BP Location: Right Arm)   Pulse 81   Temp (!) 96.8 F (36 C) (Oral)   Resp 20   Ht '5\' 3"'  (1.6 m)   Wt 87.5 kg   SpO2 99%   BMI 34.17 kg/m      PHYSICAL EXAM    GENERAL:NAD, no fevers, chills, no weakness no fatigue HEAD: Normocephalic, atraumatic.  EYES: Pupils equal, round, reactive to light. Extraocular muscles intact. No scleral icterus.  MOUTH: Moist mucosal membrane. Dentition intact. No abscess noted.  EAR, NOSE, THROAT: Clear without exudates. No external lesions.  NECK: Supple. No thyromegaly. No nodules. No JVD.  PULMONARY: decreased breath sounds with mild rhonchi worse at bases bilaterally.  CARDIOVASCULAR: S1 and S2. Regular rate and rhythm. No murmurs, rubs, or gallops. No edema. Pedal pulses 2+ bilaterally.  GASTROINTESTINAL: Soft, nontender, nondistended. No masses. Positive bowel sounds. No hepatosplenomegaly.  MUSCULOSKELETAL: No swelling, clubbing, or edema. Range of motion full in all extremities.  NEUROLOGIC: Cranial nerves II through  XII are intact. No gross focal neurological deficits. Sensation intact. Reflexes intact.  SKIN: No ulceration, lesions, rashes, or cyanosis. Skin warm and dry. Turgor intact.  PSYCHIATRIC: Mood, affect within normal limits. The patient is awake, alert and oriented x 3. Insight, judgment intact.       IMAGING     ASSESSMENT/PLAN   Acute on chronic hypoxemic respiratory failure -This is secondary to compressive atelectasis from surrounding moderate pleural effusions bilaterally status post repeat thoracentesis. -Culprit of underlying effusions seems to be CKD with CHF status post IV fluid for initial treatment with presumed sepsis.  -Patient is currently on multiple diuretics with relative hypotension precluding from making adequate perfusion and diuresis. -We discussed Pleurx catheter momentarily and she wishes for additional discussion with her family as well as review of cost and management. -I have changed some of her medication we discussed reducing narcotics and other nonessential nephrotoxins for now. -Due to relative hypotension will start amiodarone and hold Cardizem to allow [place] to diurese her a little better -She will need recruitment maneuvers with MetaNeb device to help wean down oxygen -decreased diuresis due to AKI -reviewed ABG with metabolic alkalosis due to contraction     Bilateral compressive atelectasis Chest physiotherapy with recruitment maneuvers utilizing MetaNeb            Thank you for allowing me to participate in the care of this patient.   Patient/Family are satisfied with care plan and all questions have been answered.    Provider disclosure: Patient with at least one acute or chronic illness or injury that poses a threat to life or bodily function and is being managed actively during this encounter.  All of the below services have been performed independently by signing provider:  review of prior documentation from internal and or external  health records.  Review of previous and current lab results.  Interview and comprehensive assessment during patient visit today. Review of current and previous chest radiographs/CT scans. Discussion  of management and test interpretation with health care team and patient/family.   This document was prepared using Dragon voice recognition software and may include unintentional dictation errors.     Ottie Glazier, M.D.  Division of Pulmonary & Critical Care Medicine

## 2021-09-05 NOTE — TOC Progression Note (Signed)
Transition of Care [facility]) - Progression Note    Patient Details  Name: Karen [name] MRN: [id number] Date of Birth: [birth year]  [facility] CM/SW Contact  Laurena Slimmer, RN Phone Number: 09/05/2021, 4:30 PM  Clinical Narrative:     [place] started for [place]       Expected Discharge Plan and Services                                                 Social Determinants of Health (SDOH) Interventions    Readmission Risk Interventions    10/30/2020    4:17 PM 06/21/2020    3:03 PM 09/30/2019   11:16 AM  Readmission Risk Prevention Plan  Transportation Screening Complete Complete Complete  PCP or Specialist Appt within 3-5 Days   Complete  Social Work Consult for [place] Planning/Counseling   Complete  Palliative Care Screening   Not Applicable  Medication Review Press photographer) Complete Complete Complete  PCP or Specialist appointment within 3-5 days of discharge Complete Complete   HRI or Home Care Consult Patient refused Complete   SW Recovery Care/Counseling Consult Complete Complete   Palliative Care Screening Not Applicable Not [place] Not Applicable Not Applicable

## 2021-09-05 NOTE — Progress Notes (Signed)
Physical Therapy Treatment Patient Details Name: Karen [name] MRN: [id number] DOB: 1945-06-21 Today's Date: 09/05/2021   History of Present Illness Karen [name] is a 63yoF who comes [facility] on 08/[facility]21 from [facility] for CP. P tgiven SL nitro prior to arrival. Pt Dx with PNA 2 days prior. PMH of CHF, COPD on 3L/min, sleep apnea, CKDII/III, HLD, DM, former tobacco use, PAF on eliquis, pulmonary HTN.    PT Comments    Patient alert agreeable to PT, did report 9/10 LBP. The patient had some improved mobility today; supine to sit with CGA and use of bed functions, extra time with cues for hand placement. Sit <> stand with CGA and RW, and stand pivot to recliner mostly CGA, some light minA for RW assistance. Pt fatigued and spO2 86% after mobility, but recovered >90% in 3 minutes.  Pt declined further mobility at this time, agreeable to ambulate next session. The patient would benefit from further skilled PT intervention to continue to progress towards goals. Recommendation remains appropriate.     Recommendations for follow up therapy are one component of a multi-disciplinary discharge planning process, led by the attending physician.  Recommendations may be updated based on patient status, additional functional criteria and insurance authorization.  Follow Up Recommendations  Skilled nursing-short term rehab (<3 hours/day) Can patient physically be transported by private vehicle: No   Assistance Recommended at Discharge Frequent or constant Supervision/Assistance  Patient can return home with the following Assistance with cooking/housework;Assist for transportation;Help with stairs or ramp for entrance;Direct supervision/assist for medications management;A lot of help with walking and/or transfers;A little help with bathing/dressing/bathroom   Equipment Recommendations  None recommended by PT    Recommendations for Other Services       Precautions / Restrictions  Precautions Precautions: Fall Restrictions Weight Bearing Restrictions: No     Mobility  Bed Mobility Overal bed mobility: Needs Assistance Bed Mobility: Supine to Sit     Supine to sit: Min guard     General bed mobility comments: extra time, use of bed functions    Transfers Overall transfer level: Needs assistance Equipment used: Rolling walker (2 wheels) Transfers: Sit to/from Stand Sit to Stand: Min guard Stand pivot transfers: Min assist, Min guard         General transfer comment: improved ability to complete without physical assist, minA for RW assist as needed    Ambulation/Gait               General Gait Details: pt declined due to fatigue, SOB   Stairs             Wheelchair Mobility    Modified Rankin (Stroke Patients Only)       Balance Overall balance assessment: Needs assistance Sitting-balance support: Feet supported Sitting balance-Leahy Scale: Good     Standing balance support: Reliant on assistive device for balance, During functional activity, Bilateral upper extremity supported Standing balance-Leahy Scale: Fair                              Cognition Arousal/Alertness: Awake/alert Behavior During Therapy: WFL for tasks assessed/performed Overall Cognitive Status: Within Functional Limits for tasks assessed                                          Exercises      General  Comments        Pertinent Vitals/Pain Pain Assessment Pain Assessment: 0-10 Pain Score: 9  Pain Location: back pain Pain Descriptors / Indicators: Aching, Sore Pain Intervention(s): Limited activity within patient's tolerance, Monitored during session, Premedicated before session    Home Living                          Prior Function            PT Goals (current goals can now be found in the care plan section) Progress towards PT goals: Progressing toward goals    Frequency           PT  Plan Current plan remains appropriate    Co-evaluation              AM-PAC PT "6 Clicks" Mobility   Outcome Measure  Help needed turning from your back to your side while in a flat bed without using bedrails?: A Lot Help needed moving from lying on your back to sitting on the side of a flat bed without using bedrails?: A Lot Help needed moving to and from a bed to a chair (including a wheelchair)?: A Little Help needed standing up from a chair using your arms (e.g., wheelchair or bedside chair)?: A Little Help needed to walk in hospital room?: A Little Help needed climbing 3-5 steps with a railing? : Total 6 Click Score: 14    End of Session Equipment Utilized During Treatment: Oxygen (3-4L) Activity Tolerance: Patient tolerated treatment well Patient left: with call bell/phone within reach;in chair;with chair alarm set Nurse Communication: Mobility status PT Visit Diagnosis: Other abnormalities of gait and mobility (R26.89);Difficulty in walking, not elsewhere classified (R26.2);Muscle weakness (generalized) (M62.81);Pain Pain - Right/Left:  (midline) Pain - part of body:  (low back pain)     Time: 2094-7096 PT Time Calculation (min) (ACUTE ONLY): 15 min  Charges:  $Therapeutic Activity: 8-22 mins                     Lieutenant Diego PT, DPT 12:58 PM,09/05/21

## 2021-09-05 NOTE — Inpatient Diabetes Management (Signed)
Inpatient Diabetes Program Recommendations  AACE/ADA: New Consensus Statement on Inpatient Glycemic Control (2015)  Target Ranges:  Prepandial:   less than 140 mg/dL      Peak postprandial:   less than 180 mg/dL (1-2 hours)      Critically ill patients:  140 - 180 mg/dL    Latest Reference Range & Units 09/05/21 08:19 09/05/21 12:36  Glucose-Capillary 70 - 99 mg/dL 198 (H)  16 units Novolog  22 units Semglee 310 (H)  (H): Data is abnormally high  Home DM Meds: Dulaglutide 3 mg Qweek     Jardiance 10 mg daily     Lantus 24 units BID     Novolin R 2-14 units TID with meals  Current Orders: Semglee 22 units BID  Novolog 0-20 units TID  Novolog 12 units TID with meals  Jardiance 10 mg daily    MD- Note afternoon CBGs continue to be elevated  Please consider increasing the Novolog Meal Coverage to 16 units TID with meals   --Will follow patient during hospitalization--  Wyn Quaker RN, MSN, CDE Diabetes Coordinator Inpatient Glycemic Control Team Team Pager: [phone] (8a-5p)

## 2021-09-05 NOTE — Care Management Important Message (Signed)
Important Message  Patient Details  Name: Karen [name] MRN: [id number] Date of Birth: [birth year]   Medicare Important Message Given:  Yes     [name] 09/05/2021, 11:59 AM

## 2021-09-05 NOTE — NC FL2 (Signed)
[place] LEVEL OF CARE SCREENING TOOL     IDENTIFICATION  Patient Name: Karen [name] Birthdate: [birth year] Sex: female Admission Date (Current Location): 08/14/2021  [facility] and [place] Number:  Engineering geologist and Address:  [facility], [address] [place], [place] [address]      Provider Number: [id number]  Attending Physician Name and Address:  Annita Brod, MD  Relative Name and Phone Number:  Ray [phone]    Current Level of Care: Hospital Recommended Level of Care: [place] Prior Approval Number:    Date Approved/Denied:   PASRR Number: [id number]  Discharge Plan: SNF    Current Diagnoses: Patient Active Problem List   Diagnosis Date Noted   Pericardial effusion    Permanent atrial fibrillation ([place])    HAP (hospital-acquired pneumonia) 08/14/2021   Pulmonary hypertension, unspecified ([place]) 05/29/2021   OSA (obstructive sleep apnea) 05/29/2021   Elevated troponin 05/28/2021   Lower abdominal pain 05/28/2021   Nausea and vomiting 05/28/2021   Prolonged QT interval 05/28/2021   At risk for fall due to comorbid condition 05/02/2021   Pleural effusion    Acute kidney injury superimposed on CKD ([place])    Impaired gait and mobility 12/20/2020   Pulmonary nodules/lesions, multiple 10/29/2020   Anemia in chronic kidney disease 10/29/2020   Aortic stenosis, moderate    AF (paroxysmal atrial fibrillation) ([place])    Gastroesophageal reflux disease without esophagitis    Chronic respiratory failure with hypercapnia ([place]) 05/21/2020   Severe sepsis ([place]) 05/21/2020   UTI (urinary tract infection) 05/21/2020   Hyperglycemia due to type 2 diabetes mellitus ([employer]) 05/21/2020   Abnormal CT of liver 05/21/2020   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 05/21/2020   Morbid obesity ([place]) 05/08/2020   Athscl heart disease 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 03/06/2020   Acute hip pain,  left 10/23/2019   Lumbar stenosis with neurogenic claudication 10/23/2019   COPD with acute exacerbation ([place]) 99/83/3825   Acute diastolic CHF (congestive heart failure) ([place]) 09/28/2019   (HFpEF) heart failure with preserved ejection fraction ([place]) 09/27/2019   Hypokalemia 06/28/2019   Hyponatremia 06/28/2019   Uncontrolled type 2 diabetes mellitus with hyperglycemia, with long-term current use of insulin ([place]) 06/28/2019   CKD (chronic kidney disease), stage IIIa 06/28/2019   Atrial fibrillation, chronic ([place]) 06/28/2019   Pulmonary edema 02/27/2019   Bradycardia 02/24/2019   Acute on chronic diastolic CHF (congestive heart failure) ([place]) 02/23/2019   Acute on chronic respiratory failure with hypoxia ([place]) 02/23/2019   Osteopenia of neck of left femur 11/20/2018   AVM (arteriovenous malformation) of small bowel, acquired    Acute gastric ulcer with hemorrhage    Chronic diastolic heart failure ([place]) 08/22/2018   Diarrhea 08/22/2018   Junctional bradycardia    Acute on chronic heart failure with preserved ejection fraction (HFpEF) ([place])    AKI (acute kidney injury) ([place])    Symptomatic bradycardia 08/05/2018   Acute on chronic respiratory failure ([place]) 06/25/2018   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 diabetes mellitus ([place]) 01/25/2018   History of non anemic vitamin B12 deficiency 01/25/2018   Personal history of kidney stones 11/12/2017   Urge incontinence 11/12/2017   History of leukocytosis 09/17/2017   GI bleed 08/26/2017   Arthritis 08/10/2017   Stage 4 chronic kidney disease ([place]) 08/10/2017   COPD (chronic obstructive pulmonary disease) ([place]) 08/10/2017   Diabetes mellitus type 2, uncomplicated ([place]) 05/39/7673   Hypertension  08/10/2017   Obesity (BMI 35.0-39.9 without comorbidity) 04/11/2017   Primary osteoarthritis of right knee 09/01/2016   Leucocytosis 10/19/2015   Asterixis 01/07/2015   Acute on chronic respiratory failure with hypoxia and  hypercapnia (HCC) 01/07/2015   Sciatica 01/07/2015   Weakness 01/07/2015   Chronic 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 01/04/2015   Iron deficiency anemia 06/22/2014   Microalbuminuria 06/22/2014   CHF (congestive heart failure) ([place]) 04/20/2014   Edema, peripheral 04/20/2014    Orientation RESPIRATION BLADDER Height & Weight     Self, Time, Situation, Place  O2 (5L nasal cannula) External catheter Weight: 87.5 kg Height:  5\' 3"  (160 cm)  BEHAVIORAL SYMPTOMS/MOOD NEUROLOGICAL BOWEL NUTRITION STATUS  Other (Comment) (n/a)  (n/a) Continent Diet  AMBULATORY STATUS COMMUNICATION OF NEEDS Skin   Limited Assist Verbally Other (Comment) (Eccyhmosis bilateral lower arms)                       Personal Care Assistance Level of Assistance  Bathing, Feeding, Dressing Bathing Assistance: Limited assistance Feeding assistance: Limited assistance Dressing Assistance: Limited assistance Total Care Assistance: Limited assistance   Functional Limitations Info  Sight Sight Info: Impaired        SPECIAL CARE FACTORS FREQUENCY  PT (By licensed PT), OT (By licensed OT)     PT Frequency: Min 2x weekly OT Frequency: Min 2x weekly            Contractures Contractures Info: Not present    Additional Factors Info  Code Status, Allergies Code Status Info: DNR Allergies Info: Ace Inhibitors, Beta Adrenergic Blockers, Gabapentin, Lisinopril, Lyrica (Pregabalin), Shrimp (Shellfish Allergy)           Current Medications (09/05/2021):  This is the current hospital active medication list Current Facility-Administered Medications  Medication Dose Route Frequency Provider Last Rate Last Admin   acetaminophen (TYLENOL) tablet 1,000 mg  1,000 mg Oral BID Ottie Glazier, MD   1,000 mg at 09/05/21 3536   acetaminophen (TYLENOL) tablet 500-1,000 mg  500-1,000 mg Oral Q6H PRN Neena Rhymes, MD   1,000 mg at 09/03/21 0148   allopurinol (ZYLOPRIM) tablet 100 mg  100 mg Oral Daily  Norins, Heinz Knuckles, MD   100 mg at 09/05/21 1443   apixaban (ELIQUIS) tablet 5 mg  5 mg Oral BID Kate Sable, MD   5 mg at 09/05/21 0839   atorvastatin (LIPITOR) tablet 10 mg  10 mg Oral Daily Norins, Heinz Knuckles, MD   10 mg at 09/05/21 1540   empagliflozin (JARDIANCE) tablet 10 mg  10 mg Oral Daily Norins, Heinz Knuckles, MD   10 mg at 09/05/21 0867   ferrous sulfate tablet 325 mg  325 mg Oral Q breakfast Neena Rhymes, MD   325 mg at 09/05/21 0839   fluticasone (FLONASE) 50 MCG/ACT nasal spray 2 spray  2 spray Each Nare Daily Norins, Heinz Knuckles, MD   2 spray at 08/28/21 0824   furosemide (LASIX) injection 20 mg  20 mg Intravenous BID Annita Brod, MD   20 mg at 09/05/21 0840   insulin aspart (novoLOG) injection 0-20 Units  0-20 Units Subcutaneous TID WC Norins, Heinz Knuckles, MD   4 Units at 09/05/21 0841   insulin aspart (novoLOG) injection 12 Units  12 Units Subcutaneous TID WC Annita Brod, MD   12 Units at 09/05/21 0840   insulin glargine-yfgn (SEMGLEE) injection 22 Units  22 Units Subcutaneous BID Sharion Settler, NP   22  Units at 09/05/21 0841   levalbuterol (XOPENEX) nebulizer solution 0.63 mg  0.63 mg Nebulization Q8H PRN Ottie Glazier, MD   0.63 mg at 09/05/21 0812   lidocaine (LIDODERM) 5 % 2 patch  2 patch Transdermal Q24H Annita Brod, MD   2 patch at 09/04/21 2105   melatonin tablet 5 mg  5 mg Oral QHS Norins, Heinz Knuckles, MD   5 mg at 09/04/21 2106   Oral care mouth rinse  15 mL Mouth Rinse PRN Emeterio Reeve, DO       oxyCODONE (Oxy IR/ROXICODONE) immediate release tablet 5 mg  5 mg Oral Q6H PRN Wyvonnia Dusky, MD   5 mg at 09/05/21 1030   pantoprazole (PROTONIX) EC tablet 20 mg  20 mg Oral Daily Ottie Glazier, MD   20 mg at 09/05/21 0839   potassium chloride SA (KLOR-CON M) CR tablet 20 mEq  20 mEq Oral BID Kate Sable, MD   20 mEq at 09/05/21 0839   senna-docusate (Senokot-S) tablet 1 tablet  1 tablet Oral BID BM PRN Neena Rhymes, MD   1  tablet at 08/18/21 7001   spironolactone (ALDACTONE) tablet 25 mg  25 mg Oral Daily Ottie Glazier, MD   25 mg at 09/05/21 7494   torsemide (DEMADEX) tablet 60 mg  60 mg Oral Daily End, Christopher, MD   60 mg at 09/05/21 0839   traZODone (DESYREL) tablet 50 mg  50 mg Oral QHS PRN Neena Rhymes, MD   50 mg at 09/04/21 2107     Discharge Medications: Please see discharge summary for a list of discharge medications.  Relevant Imaging Results:  Relevant Lab Results:   Additional Information Patient's SS# [id number]  Laurena Slimmer, RN

## 2021-09-05 NOTE — Progress Notes (Signed)
Occupational Therapy Treatment Patient Details Name: Karen [name] MRN: [id number] DOB: Jul 24, 1945 Today's Date: 09/05/2021   History of present illness Cassondra Stachowski is a 51yoF who [facility] from [facility] for CP. P tgiven SL nitro prior to arrival. Pt Dx with PNA 2 days prior. PMH of CHF, COPD on 3L/min, sleep apnea, CKDII/III, HLD, DM, former tobacco use, PAF on eliquis, pulmonary HTN.   OT comments  Upon entering the room, pt supine in bed and reports not feeling well. RN gives pain medication during this session. Pt performs grooming tasks with set up A from bed. Pt refusing all OOB actvity and appears anxious about mobility with encouragement to participate but pt continues to decline. Pt is able to bridge in bed and scoot self to [facility] once bed is flat. Pt is given incentive spirometer and is able to demonstrate 2 sets of 5 reps correctly. OT encouraged pt to continue to engage in these exercise during the day and pt verbalized understanding. All needs within reach.    Recommendations for follow up therapy are one component of a multi-disciplinary discharge planning process, led by the attending physician.  Recommendations may be updated based on patient status, additional functional criteria and insurance authorization.    Follow Up Recommendations  Skilled nursing-short term rehab (<3 hours/day)    Assistance Recommended at Discharge Frequent or constant Supervision/Assistance  Patient can return home with the following  A lot of help with bathing/dressing/bathroom;A little help with walking and/or transfers;Assistance with cooking/housework;Direct supervision/assist for medications management;Assist for transportation;Help with stairs or ramp for entrance   Equipment Recommendations  Other (comment) (defer to next venue of care)       Precautions / Restrictions Precautions Precautions: Fall Precaution Comments: watch spO2 Restrictions Weight Bearing  Restrictions: No       Mobility Bed Mobility Overal bed mobility: Needs Assistance Bed Mobility: Rolling Rolling: Supervision              Transfers                   General transfer comment: Pt refusal     Balance Overall balance assessment: Needs assistance                                         ADL either performed or assessed with clinical judgement   ADL Overall ADL's : Needs assistance/impaired     Grooming: Supervision/safety;Set up;Wash/dry hands                                      Extremity/Trunk Assessment Upper Extremity Assessment Upper Extremity Assessment: Overall WFL for tasks assessed   Lower Extremity Assessment Lower Extremity Assessment: Overall WFL for tasks assessed        Vision Patient Visual Report: No change from baseline            Cognition Arousal/Alertness: Awake/alert Behavior During Therapy: WFL for tasks assessed/performed Overall Cognitive Status: Within Functional Limits for tasks assessed                                 General Comments: Pt is A and O x 4  Pertinent Vitals/ Pain       Pain Assessment Pain Assessment: No/denies pain         Frequency  Min 2X/week        Progress Toward Goals  OT Goals(current goals can now be found in the care plan section)  Progress towards OT goals: Progressing toward goals  Acute Rehab OT Goals Patient Stated Goal: to get stronger OT Goal Formulation: With patient Time For Goal Achievement: 09/03/21 Potential to Achieve Goals: Good  Plan Discharge plan remains appropriate;Frequency remains appropriate       AM-PAC OT "6 Clicks" Daily Activity     Outcome Measure   Help from another person eating meals?: None Help from another person taking care of personal grooming?: A Little Help from another person toileting, which includes using toliet, bedpan, or urinal?: A Little Help from  another person bathing (including washing, rinsing, drying)?: A Lot Help from another person to put on and taking off regular upper body clothing?: A Little Help from another person to put on and taking off regular lower body clothing?: A Lot 6 Click Score: 17    End of Session Equipment Utilized During Treatment: Oxygen  OT Visit Diagnosis: Unsteadiness on feet (R26.81);Muscle weakness (generalized) (M62.81)   Activity Tolerance Patient tolerated treatment well   Patient Left in chair;with call bell/phone within reach;with chair alarm set   Nurse Communication Mobility status        Time: 1610-9604 OT Time Calculation (min): 22 min  Charges: OT General Charges $OT Visit: 1 Visit OT Treatments $Therapeutic Activity: 8-22 mins Darleen Crocker, MS, OTR/L , CBIS ascom [phone]  09/05/21, 2:55 PM

## 2021-09-05 NOTE — Progress Notes (Signed)
[facility] Progress Note  Patient: Karen [name]    ZOX:[id number]  DOA: 08/14/2021    Date of Service: the patient was seen and examined on 09/05/2021  Brief hospital course: 76 year old female with past medical history of diastolic heart failure, stage IV chronic kidney disease, COPD, diabetes mellitus and hypertension with recent hospitalization discharged to skilled nursing on 6/7 who presented back to emergency room on 6/11 for left-sided chest pain that had started 2 days prior.  At time of admission, patient treated for sepsis secondary to pneumonia and placed on broad-spectrum antibiotics as well as found to have atrial fibrillation with rapid ventricular rate and acute on chronic diastolic heart failure.  Patient since admission has needed high flow oxygen/BiPAP.  Since admission, patient has required numerous thoracenteses.  She required BiPAP at times, but then has refused, leading to episodes of decompensation.  Over the course of the past week, patient's oxygenation has steadily improved going from continuous BiPAP to intermittent BiPAP with high flow nasal cannula from 15 L and has been able to get down to her baseline of 3 L.  Palliative care following.  Assessment and Plan: Assessment and Plan: * Acute on chronic respiratory failure with hypoxia ([place]) Patient on 3L O2 at home. ON presentation she was mildly hypoxemic 2/2 CHF and PNA, with recurrent pleural effusions and overall decline, has required BiPAP and high flow oxygen.  She is actually had some improvement over the past few days going from 15 L down to 3 L, which is her baseline as of 7/3 decompensations have been in part due to noncompliance with BiPAP.  Despite dramatic improvement, overall she has such advanced severe disease she has limited life expectancy likely of 6 months at best.     Pulmonary hypertension, unspecified ([place]) Contributing to pulmonary issues  Acute on chronic heart failure with preserved  ejection fraction (HFpEF) ([place]) Last Echo 08/04/21 revealing grade III DD, tricuspid regurg. She now presents with pulmonary edema, increased SOB.  Has diuresed over 18 L and is almost - 15 L deficient.  Her weight has dropped approximately 10 pounds.  By holding her Lasix, already she has started to trend back up.  Lasix restarted on 7/1 and patient since then has had further diuresis and oxygenation continues to improve.  Changed to p.o.  COPD (chronic obstructive pulmonary disease) ([place]) Continue home regimen  Sepsis due to pneumonia (HCC)-resolved as of 08/31/2021 Resolved.  Met criteria for sepsis on admission given tachypnea, tachycardia and pulmonary source.  Sepsis stabilized and completed antibiotic course.  HAP (hospital-acquired pneumonia) To hospital this admission w/ sepsis - resolved Treatment completed  OSA (obstructive sleep apnea) Will use CPAP  Acute kidney injury superimposed on CKD (HCC) Creatinine around 1.05 and has been stable.  With diuresis creatinine had increased to 1.37 and diuretics held, and therefore creatinine continues to improve.  Restarted Lasix given slightly worsening hypoxia on 7/1.  Today creatinine 1.37.  Permanent atrial fibrillation (HCC) Continue Eliquis.  For the most part rate controlled and as her oxygenation improves, heart rate should come down  Uncontrolled type 2 diabetes mellitus with hyperglycemia, with long-term current use of insulin ([place]) Patient with very high glucose. She is on basal insulin, dapaglifozin. Patient seem to have spiking blood sugars at meals.  Appreciate diabetes coordinator help continue to increase scheduled meal coverage  Hyponatremia Chronic recurring problem.  Currently at 132   Hypokalemia Chronic recurring problem. Diuretics a contributing cause.  Replace as needed  Gastroesophageal reflux  disease without esophagitis Continue PPI tx       Body mass index is 34.17 kg/m.         Consultants: Pulmonary Cardiology Palliative care  Procedures: Echocardiogram done 6/17 noting preserved ejection fraction  Antimicrobials: Completed antibiotic course for sepsis/HCAP  Code Status: DNR   Subjective: Feeling like breathing is okay.  Tired Objective: Vital signs were reviewed and unremarkable. Vitals:   09/05/21 0822 09/05/21 1243  BP: (!) 115/59 (!) 112/56  Pulse: 81 70  Resp: 20 20  Temp: (!) 96.8 F (36 C) (!) 97.4 F (36.3 C)  SpO2: 99% 97%    Intake/Output Summary (Last 24 hours) at 09/05/2021 1926 Last data filed at 09/05/2021 0825 Gross per 24 hour  Intake 300 ml  Output 750 ml  Net -450 ml    Filed Weights   09/01/21 0500 09/02/21 0331 09/03/21 0510  Weight: 90.3 kg 88.7 kg 87.5 kg   Body mass index is 34.17 kg/m.  Exam:  General: Alert and oriented x3, no acute distress HEENT: Normocephalic and atraumatic, mucous membranes are moist Cardiovascular: Irregular rhythm, rate controlled Respiratory: Decreased breath sounds throughout, breathing less labored today Abdomen: Soft, nontender, nondistended, positive bowel sounds Musculoskeletal: No clubbing or cyanosis or edema Skin: No skin breaks, tears or lesions Psychiatry: Appropriate, no evidence of psychoses Neurology: No focal deficits  Data Reviewed: Creatinine up to 1.37  Disposition:  Status is: Inpatient Remains inpatient appropriate because: Skilled nursing  Anticipated discharge date: 7/5  Family Communication: Husband at the bedside DVT Prophylaxis: Place and maintain sequential compression device Start: 08/17/21 1311 apixaban (ELIQUIS) tablet 5 mg    Author: Annita Brod ,MD 09/05/2021 7:26 PM  To reach On-call, see care teams to locate the attending and reach out via [email or web address]. Between 7PM-7AM, please contact night-coverage If you still have difficulty reaching the attending provider, please page the [facility] (Director on Call) for [facility] on amion  for assistance.

## 2021-09-06 DIAGNOSIS — I5033 Acute on chronic diastolic (congestive) heart failure: Secondary | ICD-10-CM | POA: Diagnosis not present

## 2021-09-06 DIAGNOSIS — J9621 Acute and chronic respiratory failure with hypoxia: Secondary | ICD-10-CM | POA: Diagnosis not present

## 2021-09-06 DIAGNOSIS — G4733 Obstructive sleep apnea (adult) (pediatric): Secondary | ICD-10-CM | POA: Diagnosis not present

## 2021-09-06 DIAGNOSIS — N179 Acute kidney failure, unspecified: Secondary | ICD-10-CM | POA: Diagnosis not present

## 2021-09-06 LAB — CBC
HCT: 34.1 % — ABNORMAL LOW (ref 36.0–46.0)
Hemoglobin: 10.9 g/dL — ABNORMAL LOW (ref 12.0–15.0)
MCH: 27 pg (ref 26.0–34.0)
MCHC: 32 g/dL (ref 30.0–36.0)
MCV: 84.4 fL (ref 80.0–100.0)
Platelets: 389 10*3/uL (ref 150–400)
RBC: 4.04 MIL/uL (ref 3.87–5.11)
RDW: 14.6 % (ref 11.5–15.5)
WBC: 10.8 10*3/uL — ABNORMAL HIGH (ref 4.0–10.5)
nRBC: 0 % (ref 0.0–0.2)

## 2021-09-06 LAB — BASIC METABOLIC PANEL
Anion gap: 12 (ref 5–15)
BUN: 61 mg/dL — ABNORMAL HIGH (ref 8–23)
CO2: 34 mmol/L — ABNORMAL HIGH (ref 22–32)
Calcium: 9 mg/dL (ref 8.9–10.3)
Chloride: 86 mmol/L — ABNORMAL LOW (ref 98–111)
Creatinine, Ser: 1.42 mg/dL — ABNORMAL HIGH (ref 0.44–1.00)
GFR, Estimated: 38 mL/min — ABNORMAL LOW (ref >60)
Glucose, Bld: 144 mg/dL — ABNORMAL HIGH (ref 70–99)
Potassium: 4.1 mmol/L (ref 3.5–5.1)
Sodium: 132 mmol/L — ABNORMAL LOW (ref 135–145)

## 2021-09-06 LAB — GLUCOSE, CAPILLARY
Glucose-Capillary: 159 mg/dL — ABNORMAL HIGH (ref 70–99)
Glucose-Capillary: 106 mg/dL — ABNORMAL HIGH (ref 70–99)
Glucose-Capillary: 252 mg/dL — ABNORMAL HIGH (ref 70–99)
Glucose-Capillary: 269 mg/dL — ABNORMAL HIGH (ref 70–99)
Glucose-Capillary: 310 mg/dL — ABNORMAL HIGH (ref 70–99)
Glucose-Capillary: 239 mg/dL — ABNORMAL HIGH (ref 70–99)

## 2021-09-06 NOTE — Progress Notes (Signed)
Patient long term insulin not available at 1000. Patient med available at 1400. Confirm with Pharmacist if to give dose now. Provider notified and confirm it is ok to give morning dose now. Checked patient BG before administration it was 252.

## 2021-09-06 NOTE — Progress Notes (Signed)
PULMONOLOGY         Date: 09/06/2021,   MRN# [id number] Karen [name] August 25, 1945     AdmissionWeight: 93.5 kg                 CurrentWeight: 87.5 kg  Referring provider: Dr. Sheppard Coil   CHIEF COMPLAINT:   Recurrent pleural effusion with acute on chronic hypoxemic respiratory failure   HISTORY OF PRESENT ILLNESS   This is a pleasant 76 year old female with heart failure with preserved EF at 50%, recurrent hypoxemia, CKD, advanced COPD, diabetes, peripheral edema, GI bleeding history of dyslipidemia essential hypertension iron deficiency anemia valvular heart disease history of GI bleeding who came in with chest discomfort x2 days and has been hospitalized already for 14 days initial admission 08/14/2021.  Initially diagnosed with pneumonia status post full scope of therapy for community-acquired pneumonia.  She generally wears about 3 L/min nasal cannula.  On arrival she received IV resuscitative fluids and empiric cefepime and vancomycin noted to have accelerated atrial fibrillation treated with IV diltiazem with mild improvement however development of bilateral large pleural effusion status postthoracentesis.  Despite all this she continues to deteriorate and has had palliative care evaluation with CODE STATUS advance to DNR.  Today during discussion she wishes to continue full scope of therapy at this moment is on noninvasive ventilator with BiPAP.  She had 850 cc straw-colored fluid removed via left thoracentesis 08/16/2021 and same 1 week later CT chest independently reviewed by me with bibasilar atelectasis and compressive atelectasis of the left lung with surrounding effusion as well as pericardial effusion. PCCM consultation for further evaluation and management   08/30/21 - patient remains hypoxemic. Slight improvement on HFNC currently on 15L/min 75% spO2>90%.  S/p Therapeutic Thora >1.1L drained. Repeat CXR today for interval changes post Metaneb recruitment. Remains on  aggressive diuretic regimen with Metolazone, aldactone, torsemide currently 24L net negative. Today further reduced non essential nephrotoxins by reducing protonix to 82m po daily, due to AF have dcd Albuterol and Duoneb and Anoro ellipta inhaler and will use xopenex only q8h PRN for now.  Amiodarone has been dcd appreciate cardiology input. She should try to use BIPAP more instead of HFNC due to hypercapnia which is likely to get worse in context of contraction alkalosis. Diet modified to Renal fluid restricted 1200cc/24h.     08/31/21- patient weaned to 12L [place] from 75% 15L/min HFNC.  25L net negative. Devt of mild AKI, have reduced diuretics to aldactone only due to hypokalemia. Plan to continue metaneb, PT/OT. Minimized meds for renal sparing. Overall improved markedly over past 48h.  Reviewed plan with RT for recruitment due to persistent atelectatic changes.  She is sitting up in chair and appears more comfortable but still very weak and frail.   09/01/21- patient continues to improve. I met with husband today.  Overall long term prognosis is <6 months.  I discussed potential home hospice for her. She is now on 10L/min great improvement over past few days.   09/02/21- patient is improved , down to 5-6L/min.  Renal function is better. CO2 peripherally is improving.  She is doing PT and is slowly improved.  We discussed her prognosis and I recommend to consider home hospice at home. Husband is thinking about it and will let uKoreaknow.   09/03/21- patient remains on 5L/min, shes now 29L net negative.  Slight bump on renal function with reduced GFR and contraction.  She is getting very close to baseline and can  be dcd from pulmonary perspective  09/05/21- patient is in no distress on 5L/min [place].  She is close to baseline and from pulmonology perspective can initiate dc planning.   09/06/21- patient is doing well. She had large BM this afternoon. She feels well on only 3L/min [place].  Reviewed medical plan with  Attending physician and can follow up with her at [facility] pulmonology post rehab/NH when in chronic stable state.    PAST MEDICAL HISTORY   Past Medical History:  Diagnosis Date   (HFpEF) heart failure with preserved ejection fraction ([place])    a. 2017 Echo: EF 50%; b. 06/2018 Echo: EF 50-55%; c. 08/2018 Echo: EF 50-55%, Nl RV fxn; d. 09/2019 Echo: EF 55-60%, no rwma, mild LVH, Gr1 DD, nl RV size/fxn, PASP 63.44mHg. Mildly dil LA. Triv MR. Mod AS (AoV 0.94cm^2 VTI; mean grad 17.37mg); d. 02/2021 Echo: EF 60-65%, no rwma, GrI DD, mildly red RV fxn, RVSP 64.64m14m, mild-mod MR, mod-sev TR, mild AS.   Acute on chronic respiratory failure with hypoxia and hypercapnia (HCC) 01/07/2015   Anemia    Asterixis 01/07/2015   Asthma    Cataract    CKD (chronic kidney disease), stage III (HCC)    COPD (chronic obstructive pulmonary disease) ([place]  a. 06/2018 tobacco use, home 3L oxygen    Diabetes mellitus without complication ([place]  a. 09/2019 A1C 8.8   Edema, peripheral 04/20/2014   GI bleed 06/28/2019   History of kidney stones    Hyperlipidemia    Hypertension    Iron deficiency anemia 06/22/2014   Junctional bradycardia    a. In setting of beta blocker therapy.   Leucocytosis 10/19/2015   Moderate aortic stenosis    a.  09/2019 Echo: Mod AS (AoV 0.94cm^2 VTI; mean grad 17.3mm164m; b. 02/2021 Echo: Mild AS.   Morbid obesity (HCC)[place] Overactive bladder    Primary osteoarthritis of right knee 09/01/2016   Sciatica 01/07/2015   Valvular heart disease    a. 02/2021 Echo: EF 60-65%, no rwma, GrI DD, mild-mod MR, mod-sev TR, mild AS     SURGICAL HISTORY   Past Surgical History:  Procedure Laterality Date   APPENDECTOMY     CESAREAN SECTION     x3   CHOLECYSTECTOMY     COLONOSCOPY WITH PROPOFOL N/A 08/28/2017   Procedure: COLONOSCOPY WITH PROPOFOL;  Surgeon: WohlLucilla Lame;  Location: [facility] ENDOSCOPY;  Service: Endoscopy;  Laterality: N/A;   COLONOSCOPY WITH PROPOFOL N/A 08/29/2017    Procedure: COLONOSCOPY WITH PROPOFOL;  Surgeon: WohlLucilla Lame;  [facility];  Service: Endoscopy;  Laterality: N/A;   CYSTOSCOPY W/ URETERAL STENT PLACEMENT Right 09/15/2017   Procedure: CYSTOSCOPY WITH RETROGRADE PYELOGRAM/URETERAL STENT PLACEMENT;  Surgeon: McKeCleon Gustin;  Location: [facility] ORS;  Service: Urology;  Laterality: Right;   CYSTOSCOPY/URETEROSCOPY/HOLMIUM LASER/STENT PLACEMENT Right 10/09/2017   Procedure: CYSTOSCOPY/URETEROSCOPY/HOLMIUM LASER/STENT PLACEMENT;  Surgeon: StoiAbbie Sons;  Location: [facility] ORS;  Service: Urology;  Laterality: Right;  right Stent exchange   ESOPHAGOGASTRODUODENOSCOPY (EGD) WITH PROPOFOL N/A 09/16/2018   Procedure: ESOPHAGOGASTRODUODENOSCOPY (EGD) WITH PROPOFOL;  Surgeon: VangLin Landsman;  [facility];  Laterality: N/A;   EYE SURGERY     IR THORACENTESIS ASP PLEURAL SPACE W/IMG GUIDE  02/07/2021     FAMILY HISTORY   Family History  Problem Relation Age of Onset   Other Mother        unknown medical history   Other Father  unknown medical history     SOCIAL HISTORY   Social History   Tobacco Use   Smoking status: Former    Packs/day: 1.00    Years: 20.00    Total pack years: 20.00    Types: Cigarettes    Quit date: 12/04/1992    Years since quitting: 28.7   Smokeless tobacco: Never  Vaping Use   Vaping Use: Never used  Substance Use Topics   Alcohol use: No   Drug use: No     MEDICATIONS    Home Medication:    Current Medication:  Current Facility-Administered Medications:    acetaminophen (TYLENOL) tablet 1,000 mg, 1,000 mg, Oral, BID, Lanney Gins, Raiven Belizaire, MD, 1,000 mg at 09/06/21 0539   acetaminophen (TYLENOL) tablet 500-1,000 mg, 500-1,000 mg, Oral, Q6H PRN, Norins, Heinz Knuckles, MD, 1,000 mg at 09/06/21 0329   allopurinol (ZYLOPRIM) tablet 100 mg, 100 mg, Oral, Daily, Norins, Heinz Knuckles, MD, 100 mg at 09/06/21 0818   apixaban (ELIQUIS) tablet 5 mg, 5  mg, Oral, BID, Agbor-Etang, Brian, MD, 5 mg at 09/06/21 0818   atorvastatin (LIPITOR) tablet 10 mg, 10 mg, Oral, Daily, Norins, Heinz Knuckles, MD, 10 mg at 09/06/21 0817   empagliflozin (JARDIANCE) tablet 10 mg, 10 mg, Oral, Daily, Norins, Heinz Knuckles, MD, 10 mg at 09/06/21 7673   ferrous sulfate tablet 325 mg, 325 mg, Oral, Q breakfast, Norins, Heinz Knuckles, MD, 325 mg at 09/06/21 0818   fluticasone (FLONASE) 50 MCG/ACT nasal spray 2 spray, 2 spray, Each Nare, Daily, Norins, Heinz Knuckles, MD, 2 spray at 08/28/21 0824   insulin aspart (novoLOG) injection 0-20 Units, 0-20 Units, Subcutaneous, TID WC, Norins, Heinz Knuckles, MD, 4 Units at 09/06/21 0815   insulin aspart (novoLOG) injection 12 Units, 12 Units, Subcutaneous, TID WC, Annita Brod, MD, 12 Units at 09/06/21 0816   insulin glargine-yfgn (SEMGLEE) injection 22 Units, 22 Units, Subcutaneous, BID, Sharion Settler, NP, 22 Units at 09/05/21 2207   levalbuterol (XOPENEX) nebulizer solution 0.63 mg, 0.63 mg, Nebulization, Q8H PRN, Lanney Gins, Callaghan Laverdure, MD, 0.63 mg at 09/05/21 0812   lidocaine (LIDODERM) 5 % 2 patch, 2 patch, Transdermal, Q24H, Annita Brod, MD, 2 patch at 09/05/21 2209   melatonin tablet 5 mg, 5 mg, Oral, QHS, Norins, Heinz Knuckles, MD, 5 mg at 09/05/21 2208   Oral care mouth rinse, 15 mL, Mouth Rinse, PRN, Emeterio Reeve, DO   oxyCODONE (Oxy IR/ROXICODONE) immediate release tablet 5 mg, 5 mg, Oral, Q6H PRN, Wyvonnia Dusky, MD, 5 mg at 09/06/21 0817   pantoprazole (PROTONIX) EC tablet 20 mg, 20 mg, Oral, Daily, Lanney Gins, Gilberto Stanforth, MD, 20 mg at 09/05/21 0839   potassium chloride SA (KLOR-CON M) CR tablet 20 mEq, 20 mEq, Oral, BID, Agbor-Etang, Brian, MD, 20 mEq at 09/06/21 0818   senna-docusate (Senokot-S) tablet 1 tablet, 1 tablet, Oral, BID BM PRN, Norins, Heinz Knuckles, MD, 1 tablet at 08/18/21 4193   spironolactone (ALDACTONE) tablet 25 mg, 25 mg, Oral, Daily, Lanney Gins, Jolynn Bajorek, MD, 25 mg at 09/06/21 0817   torsemide (DEMADEX) tablet 60 mg,  60 mg, Oral, BID, Annita Brod, MD, 60 mg at 09/06/21 0817   traZODone (DESYREL) tablet 50 mg, 50 mg, Oral, QHS PRN, Norins, Heinz Knuckles, MD, 50 mg at 09/05/21 2209    ALLERGIES   Ace inhibitors, Beta adrenergic blockers, Gabapentin, Lisinopril, Lyrica [pregabalin], and Shrimp [shellfish allergy]     REVIEW OF SYSTEMS    Review of Systems:  Gen:  Denies  fever, sweats, chills weigh  loss  HEENT: Denies blurred vision, double vision, ear pain, eye pain, hearing loss, nose bleeds, sore throat Cardiac:  No dizziness, chest pain or heaviness, chest tightness,edema Resp:   reports dyspnea chronically  Gi: Denies swallowing difficulty, stomach pain, nausea or vomiting, diarrhea, constipation, bowel incontinence Gu:  Denies bladder incontinence, burning urine Ext:   Denies Joint pain, stiffness or swelling Skin: Denies  skin rash, easy bruising or bleeding or hives Endoc:  Denies polyuria, polydipsia , polyphagia or weight change Psych:   Denies depression, insomnia or hallucinations   Other:  All other systems negative   VS: BP 117/68 (BP Location: Right Arm)   Pulse 86   Temp 97.8 F (36.6 C) (Oral)   Resp (!) 21   Ht '5\' 3"'  (1.6 m)   Wt 87.5 kg   SpO2 95%   BMI 34.19 kg/m      PHYSICAL EXAM    GENERAL:NAD, no fevers, chills, no weakness no fatigue HEAD: Normocephalic, atraumatic.  EYES: Pupils equal, round, reactive to light. Extraocular muscles intact. No scleral icterus.  MOUTH: Moist mucosal membrane. Dentition intact. No abscess noted.  EAR, NOSE, THROAT: Clear without exudates. No external lesions.  NECK: Supple. No thyromegaly. No nodules. No JVD.  PULMONARY: decreased breath sounds with mild rhonchi worse at bases bilaterally.  CARDIOVASCULAR: S1 and S2. Regular rate and rhythm. No murmurs, rubs, or gallops. No edema. Pedal pulses 2+ bilaterally.  GASTROINTESTINAL: Soft, nontender, nondistended. No masses. Positive bowel sounds. No hepatosplenomegaly.   MUSCULOSKELETAL: No swelling, clubbing, or edema. Range of motion full in all extremities.  NEUROLOGIC: Cranial nerves II through XII are intact. No gross focal neurological deficits. Sensation intact. Reflexes intact.  SKIN: No ulceration, lesions, rashes, or cyanosis. Skin warm and dry. Turgor intact.  PSYCHIATRIC: Mood, affect within normal limits. The patient is awake, alert and oriented x 3. Insight, judgment intact.       IMAGING     ASSESSMENT/PLAN   Acute on chronic hypoxemic respiratory failure -This is secondary to compressive atelectasis from surrounding moderate pleural effusions bilaterally status post repeat thoracentesis. -Culprit of underlying effusions seems to be CKD with CHF status post IV fluid for initial treatment with presumed sepsis.  -Patient is currently on multiple diuretics with relative hypotension precluding from making adequate perfusion and diuresis. She has Acute on chronic renal failure.   -We discussed Pleurx catheter momentarily and she wishes for additional discussion with her family as well as review of cost and management. -She will need recruitment maneuvers with MetaNeb device to help wean down oxygen -decreased diuresis due to AKI -reviewed ABG with metabolic alkalosis due to contraction     Bilateral compressive atelectasis Chest physiotherapy with recruitment maneuvers utilizing MetaNeb            Thank you for allowing me to participate in the care of this patient.   Patient/Family are satisfied with care plan and all questions have been answered.    Provider disclosure: Patient with at least one acute or chronic illness or injury that poses a threat to life or bodily function and is being managed actively during this encounter.  All of the below services have been performed independently by signing provider:  review of prior documentation from internal and or external health records.  Review of previous and current lab  results.  Interview and comprehensive assessment during patient visit today. Review of current and previous chest radiographs/CT scans. Discussion of management and test interpretation with health care team and patient/family.  This document was prepared using Dragon voice recognition software and may include unintentional dictation errors.     Ottie Glazier, M.D.  Division of Pulmonary & Critical Care Medicine

## 2021-09-06 NOTE — Progress Notes (Signed)
[facility] Progress Note  Patient: Karen [name]    BMW:[id number]  DOA: 08/14/2021    Date of Service: the patient was seen and examined on 09/06/2021  Brief hospital course: 76 year old female with past medical history of diastolic heart failure, stage IV chronic kidney disease, COPD, diabetes mellitus and hypertension with recent hospitalization discharged to skilled nursing on 6/7 who presented back to emergency room on 6/11 for left-sided chest pain that had started 2 days prior.  At time of admission, patient treated for sepsis secondary to pneumonia and placed on broad-spectrum antibiotics as well as found to have atrial fibrillation with rapid ventricular rate and acute on chronic diastolic heart failure.  Patient since admission has needed high flow oxygen/BiPAP.  Since admission, patient has required numerous thoracenteses.  She required BiPAP at times, but then has refused, leading to episodes of decompensation.  Over the course of the past week, patient's oxygenation has steadily improved going from continuous BiPAP to intermittent BiPAP with high flow nasal cannula from 15 L and has been able to get down to her baseline of 3 L.  Palliative care following.  Assessment and Plan: Assessment and Plan: * Acute on chronic respiratory failure with hypoxia ([place]) Patient on 3L O2 at home. ON presentation she was mildly hypoxemic 2/2 CHF and PNA, with recurrent pleural effusions and overall decline, has required BiPAP and high flow oxygen.  She is actually had some improvement over the past few days going from 15 L down to 3 L, which is her baseline as of 7/3 decompensations have been in part due to noncompliance with BiPAP.  Despite dramatic improvement, overall she has such advanced severe disease she has limited life expectancy likely of 6 months at best.     Pulmonary hypertension, unspecified ([place]) Contributing to pulmonary issues  Acute on chronic heart failure with preserved  ejection fraction (HFpEF) ([place]) Last Echo 08/04/21 revealing grade III DD, tricuspid regurg. She now presents with pulmonary edema, increased SOB.  Has diuresed over 18 L and is almost - 15 L deficient.  Her weight has dropped approximately 10 pounds.  By holding her Lasix, already she has started to trend back up.  Lasix restarted on 7/1 and patient since then has had further diuresis and oxygenation continues to improve.  Changed to p.o.  COPD (chronic obstructive pulmonary disease) ([place]) Continue home regimen  Sepsis due to pneumonia (HCC)-resolved as of 08/31/2021 Resolved.  Met criteria for sepsis on admission given tachypnea, tachycardia and pulmonary source.  Sepsis stabilized and completed antibiotic course.  HAP (hospital-acquired pneumonia) To hospital this admission w/ sepsis - resolved Treatment completed  OSA (obstructive sleep apnea) Will use CPAP  Acute kidney injury superimposed on CKD (HCC) Creatinine around 1.05 and has been stable.  With diuresis creatinine had increased to 1.37 and diuretics held, and therefore creatinine continues to improve.  Restarted Lasix given slightly worsening hypoxia on 7/1.  Today creatinine 1.37.  Permanent atrial fibrillation (HCC) Continue Eliquis.  For the most part rate controlled and as her oxygenation improves, heart rate should come down  Uncontrolled type 2 diabetes mellitus with hyperglycemia, with long-term current use of insulin ([place]) Patient with very high glucose. She is on basal insulin, dapaglifozin. Patient seem to have spiking blood sugars at meals.  Appreciate diabetes coordinator help continue to increase scheduled meal coverage.  As she is feeling better, appetite is improved and blood sugars have started to trend a little higher  Hyponatremia Chronic recurring problem.  Currently  at 132   Hypokalemia Chronic recurring problem. Diuretics a contributing cause.  Replace as needed  Gastroesophageal reflux disease without  esophagitis Continue PPI tx       Body mass index is 34.19 kg/m.        Consultants: Pulmonary Cardiology Palliative care  Procedures: Echocardiogram done 6/17 noting preserved ejection fraction  Antimicrobials: Completed antibiotic course for sepsis/HCAP  Code Status: DNR   Subjective: Feeling much better Objective: Vital signs were reviewed and unremarkable. Vitals:   09/06/21 1112 09/06/21 1639  BP: 117/68 (!) 111/53  Pulse: 86 78  Resp: (!) 21 19  Temp: 97.8 F (36.6 C) 98 F (36.7 C)  SpO2: 95% 96%    Intake/Output Summary (Last 24 hours) at 09/06/2021 1834 Last data filed at 09/06/2021 1640 Gross per 24 hour  Intake --  Output 1650 ml  Net -1650 ml    Filed Weights   09/02/21 0331 09/03/21 0510 09/06/21 0457  Weight: 88.7 kg 87.5 kg 87.5 kg   Body mass index is 34.19 kg/m.  Exam:  General: Alert and oriented x3, no acute distress HEENT: Normocephalic and atraumatic, mucous membranes are moist Cardiovascular: Irregular rhythm, rate controlled Respiratory: Decreased breath sounds throughout Abdomen: Soft, nontender, nondistended, positive bowel sounds Musculoskeletal: No clubbing or cyanosis or edema Skin: No skin breaks, tears or lesions Psychiatry: Appropriate, no evidence of psychoses Neurology: No focal deficits  Data Reviewed: Creatinine up to 1.42  Disposition:  Status is: Inpatient Remains inpatient appropriate because: Skilled nursing  Anticipated discharge date: 7/5  Family Communication: Husband at the bedside DVT Prophylaxis: Place and maintain sequential compression device Start: 08/17/21 1311 apixaban (ELIQUIS) tablet 5 mg    Author: Annita Brod ,MD 09/06/2021 6:34 PM  To reach On-call, see care teams to locate the attending and reach out via [email or web address]. Between 7PM-7AM, please contact night-coverage If you still have difficulty reaching the attending provider, please page the [facility] (Director on Call) for [facility] on amion for assistance.

## 2021-09-06 NOTE — Plan of Care (Signed)
Notes reviewed. Continue to recommend outpatient palliative to follow at discharge. Transition to hospice care when patient is ready.

## 2021-09-07 DIAGNOSIS — G4733 Obstructive sleep apnea (adult) (pediatric): Secondary | ICD-10-CM | POA: Diagnosis not present

## 2021-09-07 DIAGNOSIS — I5033 Acute on chronic diastolic (congestive) heart failure: Secondary | ICD-10-CM | POA: Diagnosis not present

## 2021-09-07 DIAGNOSIS — J9621 Acute and chronic respiratory failure with hypoxia: Secondary | ICD-10-CM | POA: Diagnosis not present

## 2021-09-07 DIAGNOSIS — E871 Hypo-osmolality and hyponatremia: Secondary | ICD-10-CM | POA: Diagnosis not present

## 2021-09-07 LAB — GLUCOSE, CAPILLARY
Glucose-Capillary: 184 mg/dL — ABNORMAL HIGH (ref 70–99)
Glucose-Capillary: 240 mg/dL — ABNORMAL HIGH (ref 70–99)

## 2021-09-07 LAB — BASIC METABOLIC PANEL
Anion gap: 10 (ref 5–15)
BUN: 62 mg/dL — ABNORMAL HIGH (ref 8–23)
CO2: 34 mmol/L — ABNORMAL HIGH (ref 22–32)
Calcium: 9.2 mg/dL (ref 8.9–10.3)
Chloride: 89 mmol/L — ABNORMAL LOW (ref 98–111)
Creatinine, Ser: 1.39 mg/dL — ABNORMAL HIGH (ref 0.44–1.00)
GFR, Estimated: 39 mL/min — ABNORMAL LOW (ref >60)
Glucose, Bld: 183 mg/dL — ABNORMAL HIGH (ref 70–99)
Potassium: 4.3 mmol/L (ref 3.5–5.1)
Sodium: 133 mmol/L — ABNORMAL LOW (ref 135–145)

## 2021-09-07 MED ORDER — INSULIN LISPRO (1 UNIT DIAL) 100 UNIT/ML (KWIKPEN): 12.0000 [IU] | PEN_INJECTOR | Freq: Three times a day (TID) | SUBCUTANEOUS | 11 refills | Status: DC

## 2021-09-07 MED ORDER — SPIRONOLACTONE 25 MG PO TABS: 25.0000 mg | ORAL_TABLET | Freq: Every day | ORAL | 1 refills | Status: DC

## 2021-09-07 MED ORDER — TORSEMIDE 60 MG PO TABS: 20.0000 mg | ORAL_TABLET | Freq: Two times a day (BID) | ORAL | 1 refills | Status: DC

## 2021-09-07 MED ORDER — LEVALBUTEROL HCL 0.63 MG/3ML IN NEBU: 0.6300 mg | INHALATION_SOLUTION | Freq: Three times a day (TID) | RESPIRATORY_TRACT | 12 refills | Status: DC | PRN

## 2021-09-07 MED ORDER — LIDOCAINE 5 % EX PTCH: 2.0000 | MEDICATED_PATCH | CUTANEOUS | 0 refills | Status: DC

## 2021-09-07 MED ORDER — OXYCODONE HCL 5 MG PO TABS
5.0000 mg | ORAL_TABLET | Freq: Four times a day (QID) | ORAL | 0 refills | Status: DC | PRN
Start: 2021-09-07 — End: 2021-11-29

## 2021-09-07 NOTE — Progress Notes (Signed)
Pt discharge as per MD order. IV and tele D/c Pt transferred to SNF via EMS. Report called nurse to [place].

## 2021-09-07 NOTE — Discharge Summary (Signed)
Physician Discharge Summary   Patient: Karen [name] MRN: [id number] DOB: 05/17/1945  Admit date:     08/14/2021  Discharge date: 09/07/21  Discharge Physician: Annita Brod   PCP: Margarita Rana, MD   Recommendations at discharge:   New medication: Oxy IR 5 mg p.o. every 6 hours as needed for pain New medication: Lidoderm topical patch 5%, apply 2 patches to lower back, on for 12 hours, off for 12 hours Medication change: Humalog insulin changed to 12 units 3 times daily with meals New medication: Xopenex nebulizers every 8 hours as needed for shortness of breath Patient being discharged to skilled nursing Medication change: Atrovent spray, fluticasone inhaler, dulaglutide weekly injection, Cardizem twice daily, Augmentin and DuoNebs discontinued Medication change: Torsemide increased to 60 mg p.o. twice daily Medication change: Aldactone increased to 25 mg p.o. daily Hospice will follow patient at skilled nursing  Discharge Diagnoses: Principal Problem:   Acute on chronic respiratory failure with hypoxia ([place]) Active Problems:   Pulmonary hypertension, unspecified ([place])   Acute on chronic heart failure with preserved ejection fraction (HFpEF) (HCC)   COPD (chronic obstructive pulmonary disease) (HCC)   Acute kidney injury superimposed on CKD (HCC)   OSA (obstructive sleep apnea)   Uncontrolled type 2 diabetes mellitus with hyperglycemia, with long-term current use of insulin (HCC)   Permanent atrial fibrillation (HCC)   Hyponatremia   Hypokalemia   Gastroesophageal reflux disease without esophagitis   Pericardial effusion  Resolved Problems:   Lobar pneumonia (HCC)   Sepsis due to pneumonia (HCC)   HAP (hospital-acquired pneumonia)  Hospital Course: 76 year old female with past medical history of diastolic heart failure, stage IV chronic kidney disease, COPD, diabetes mellitus and hypertension with recent hospitalization discharged to skilled nursing on 6/7 who  presented back to emergency room on 6/11 for left-sided chest pain that had started 2 days prior.  At time of admission, patient treated for sepsis secondary to pneumonia and placed on broad-spectrum antibiotics as well as found to have atrial fibrillation with rapid ventricular rate and acute on chronic diastolic heart failure.  Patient since admission has needed high flow oxygen/BiPAP.  Since admission, patient has required numerous thoracenteses.  She required BiPAP at times, but then has refused, leading to episodes of decompensation.  Over the course of the past week, patient's oxygenation has steadily improved going from continuous BiPAP to intermittent BiPAP with high flow nasal cannula from 15 L and has been able to get down to her baseline of 3 L.  Plan is for patient to go back to skilled nursing.  Patient and husband understand that she has a limited life expectancy and her overall medical issues.  Hospice services will be available for patient at skilled nursing.  Assessment and Plan: * Acute on chronic respiratory failure with hypoxia (HCC) Patient on 3L O2 at home. ON presentation she was mildly hypoxemic 2/2 CHF and PNA, with recurrent pleural effusions and overall decline, has required BiPAP and high flow oxygen.  She is actually had some improvement over the past few days going from 15 L down to 3 L, which is her baseline as of 7/3 decompensations have been in part due to noncompliance with BiPAP.  Despite dramatic improvement, overall she has such advanced severe disease she has limited life expectancy likely of 6 months at best.     Pulmonary hypertension, unspecified ([place]) Contributing to pulmonary issues  Acute on chronic heart failure with preserved ejection fraction (HFpEF) ([place]) Last Echo 08/04/21 revealing  grade III DD, tricuspid regurg. She now presents with pulmonary edema, increased SOB.  Has diuresed over 18 L and is almost - 15 L deficient.  Her weight has dropped approximately  10 pounds.  By holding her Lasix, already she has started to trend back up.  Lasix restarted on 7/1 and patient since then has had further diuresis and oxygenation continues to improve.  Oral diuretics adjusted.  COPD (chronic obstructive pulmonary disease) ([place]) Continue home regimen  Sepsis due to pneumonia (HCC)-resolved as of 08/31/2021 Resolved.  Met criteria for sepsis on admission given tachypnea, tachycardia and pulmonary source.  Sepsis stabilized and completed antibiotic course.  OSA (obstructive sleep apnea) Will use CPAP  Acute kidney injury superimposed on CKD (HCC) Creatinine around 1.05 and has been stable.  With diuresis creatinine had increased to 1.37 and diuretics held, and therefore creatinine continues to improve.  Restarted Lasix given slightly worsening hypoxia on 7/1.  Creatinine upon day of discharge at 1.39.  HAP (hospital-acquired pneumonia)-resolved as of 09/07/2021 To hospital this admission w/ sepsis - resolved Treatment completed  Permanent atrial fibrillation ([place]) Continue Eliquis.  For the most part rate controlled and as her oxygenation improves, heart rate should come down  Uncontrolled type 2 diabetes mellitus with hyperglycemia, with long-term current use of insulin ([place]) Patient with very high glucose. She is on basal insulin, dapaglifozin. Patient seem to have spiking blood sugars at meals.  Appreciate diabetes coordinator help continue to increase scheduled meal coverage.  As she is feeling better, appetite is improved and blood sugars have started to trend a little higher.  Short-term acting insulin with meals adjusted.  Hyponatremia Chronic recurring problem.  At 133 on day of discharge.   Hypokalemia Chronic recurring problem. Diuretics a contributing cause.  Replace as needed  Gastroesophageal reflux disease without esophagitis Continue PPI tx        Pain control - [place] Controlled Substance Reporting System database was  reviewed. and patient was instructed, not to drive, operate heavy machinery, perform activities at heights, swimming or participation in water activities or provide baby-sitting services while on Pain, Sleep and Anxiety Medications; until their outpatient Physician has advised to do so again. Also recommended to not to take more than prescribed Pain, Sleep and Anxiety Medications.  Consultants: Pulmonary Cardiology Palliative care   Procedures: Echocardiogram done 6/17 noting preserved ejection fraction Ultrasound-guided thoracentesis done 6/27 for left pleural effusion  Disposition: Skilled nursing facility Diet recommendation:  Discharge Diet Orders (From admission, onward)     Start     Ordered   09/07/21 0000  Diet - low sodium heart healthy        09/07/21 0934            DISCHARGE MEDICATION: Allergies as of 09/07/2021       Reactions   Ace Inhibitors Hives   Beta Adrenergic Blockers    Junctional bradycardia   Gabapentin Hives   Lisinopril Hives   Lyrica [pregabalin] Hives   Shrimp [shellfish Allergy] Swelling   Swelling of the lips        Medication List     STOP taking these medications    amoxicillin-clavulanate 500-125 MG tablet Commonly known as: AUGMENTIN   diltiazem 60 MG 12 hr capsule Commonly known as: CARDIZEM SR   Dulaglutide 3 MG/0.5ML Sopn   Fluticasone-Umeclidin-Vilant 100-62.5-25 MCG/INH Aepb   insulin glargine-yfgn 100 UNIT/ML injection Commonly known as: SEMGLEE   ipratropium 0.06 % nasal spray Commonly known as: ATROVENT   ipratropium-albuterol  0.5-2.5 (3) MG/3ML Soln Commonly known as: DUONEB       TAKE these medications    acetaminophen 500 MG tablet Commonly known as: TYLENOL Take 1-2 tablets (500-1,000 mg total) by mouth every 6 (six) hours as needed for mild pain, fever, moderate pain or headache. Do not take more than 4 grams a day   albuterol 108 (90 Base) MCG/ACT inhaler Commonly known as: VENTOLIN HFA Inhale 2  puffs into the lungs every 6 (six) hours as needed for wheezing or shortness of breath.   allopurinol 100 MG tablet Commonly known as: ZYLOPRIM Take 1 tablet by mouth daily.   atorvastatin 10 MG tablet Commonly known as: LIPITOR Take 1 tablet by mouth daily.   Eliquis 5 MG Tabs tablet Generic drug: apixaban Take 1 tablet by mouth twice daily   empagliflozin 10 MG Tabs tablet Commonly known as: JARDIANCE Take 1 tablet (10 mg total) by mouth daily.   esomeprazole 40 MG capsule Commonly known as: NEXIUM Take 40 mg by mouth daily.   fluticasone 50 MCG/ACT nasal spray Commonly known as: FLONASE Place 2 sprays into both nostrils daily.   insulin glargine 100 UNIT/ML injection Commonly known as: LANTUS Inject 24 Units into the skin 2 (two) times daily with a meal.   insulin lispro 100 UNIT/ML KwikPen Commonly known as: HUMALOG Inject 12 Units into the skin in the morning, at noon, and at bedtime. 10 units with meals plus additional units for correction What changed: how much to take   insulin regular 100 units/mL injection Commonly known as: NOVOLIN R Inject 2-14 Units into the skin 3 (three) times daily before meals. Blood Sugar 201-250.. Give 2 units Blood Sugar 250-300. Give 4 units  Blood Sugar 301-350. Give 6 units Blood Sugar 351-400.  Give 8 units Blood Sugar 401-450 . Give 10 units Blood Sugar 451-500 Give 12 units Blood Sugar <500 Give 14 units   Iron 325 (65 Fe) MG Tabs Take 1 tablet (325 mg total) by mouth daily.   levalbuterol 0.63 MG/3ML nebulizer solution Commonly known as: XOPENEX Take 3 mLs (0.63 mg total) by nebulization every 8 (eight) hours as needed for wheezing or shortness of breath.   lidocaine 5 % Commonly known as: LIDODERM Place 2 patches onto the skin daily. Remove & Discard patch within 12 hours or as directed by MD   melatonin 5 MG Tabs Take 5 mg by mouth at bedtime.   montelukast 10 MG tablet Commonly known as: SINGULAIR Take 10 mg  by mouth at bedtime.   Mucinex 600 MG 12 hr tablet Generic drug: guaiFENesin Take 600 mg by mouth daily.   oxyCODONE 5 MG immediate release tablet Commonly known as: Oxy IR/ROXICODONE Take 1 tablet (5 mg total) by mouth every 6 (six) hours as needed for moderate pain.   senna-docusate 8.6-50 MG tablet Commonly known as: Senokot-S Take 1 tablet by mouth 2 (two) times daily between meals as needed for mild constipation.   spironolactone 25 MG tablet Commonly known as: ALDACTONE Take 1 tablet (25 mg total) by mouth daily. What changed: how much to take   Torsemide 60 MG Tabs Take 20 mg by mouth 2 (two) times daily. What changed: medication strength   traZODone 50 MG tablet Commonly known as: DESYREL Take 1 tablet (50 mg total) by mouth at bedtime as needed for sleep.   vitamin B-12 500 MCG tablet Commonly known as: CYANOCOBALAMIN Take 500 mcg by mouth daily.   Vitamin D (Cholecalciferol) 25 MCG (1000  UT) Caps Take 1 tablet by mouth daily.        Discharge Exam: Filed Weights   09/03/21 0510 09/06/21 0457 09/07/21 0401  Weight: 87.5 kg 87.5 kg 91 kg   General: Alert and oriented x2, no acute distress Cardiovascular: Regular rate and rhythm, S1-S2 Lungs: Decreased breath sounds throughout  Condition at discharge: poor  The results of significant diagnostics from this hospitalization (including imaging, microbiology, ancillary and laboratory) are listed below for reference.   Imaging Studies: DG Chest Port 1 View  Result Date: 08/30/2021 CLINICAL DATA:  Atelectasis. History of hypertension, COPD, heart failure. EXAM: PORTABLE CHEST 1 VIEW COMPARISON:  08/29/2021 FINDINGS: Heart is enlarged. There are bilateral pleural effusions. There is increased opacity at the LEFT lung base, consistent with effusion and atelectasis or infiltrate. There is mild interstitial pulmonary edema. IMPRESSION: 1. Stable cardiomegaly. 2. Bilateral pleural effusions. 3. Increased opacity at  the LEFT lung base. Appearance is similar to studies performed on 08/28/2021. Electronically Signed   By: Nolon Nations M.D.   On: 08/30/2021 15:57   US THORACENTESIS ASP PLEURAL SPACE W/IMG GUIDE  Result Date: 08/30/2021 INDICATION: Pleural effusion EXAM: ULTRASOUND GUIDED LEFT THORACENTESIS MEDICATIONS: None. COMPLICATIONS: None immediate. PROCEDURE: An ultrasound guided thoracentesis was thoroughly discussed with the patient and questions answered. The benefits, risks, alternatives and complications were also discussed. The patient understands and wishes to proceed with the procedure. Written consent was obtained. Ultrasound was performed to localize and mark an adequate pocket of fluid in the left chest. The area was then prepped and draped in the normal sterile fashion. 1% Lidocaine was used for local anesthesia. Under ultrasound guidance a 19 gauge Yueh catheter was introduced. Thoracentesis was performed. The catheter was removed and a dressing applied. FINDINGS: A total of approximately 1,150 mL of clear straw-colored pleural fluid was removed. IMPRESSION: Successful ultrasound guided left thoracentesis yielding 1150 mL of pleural fluid. Electronically Signed   By: Albin Felling M.D.   On: 08/30/2021 12:28   DG Chest Port 1 View  Result Date: 08/29/2021 CLINICAL DATA:  Status post thoracentesis EXAM: PORTABLE CHEST 1 VIEW COMPARISON:  08/28/2021 FINDINGS: Small bilateral pleural effusions, right greater than left. Bilateral diffuse interstitial thickening. No pneumothorax. Stable cardiomegaly. No acute osseous abnormality. IMPRESSION: 1. Small bilateral pleural effusions, right greater than left. No pneumothorax. Electronically Signed   By: Kathreen Devoid M.D.   On: 08/29/2021 16:54   DG Chest Port 1 View  Result Date: 08/28/2021 CLINICAL DATA:  Shortness of breath and pleural effusion EXAM: PORTABLE CHEST 1 VIEW COMPARISON:  None Available. FINDINGS: Cardiomegaly. Bilateral layering pleural  effusions are stable. Increased interstitial markings bilaterally suggesting pulmonary edema. No pneumothorax. No other interval changes. IMPRESSION: Cardiomegaly, left greater than right layering pleural effusions, and suspected pulmonary edema. Electronically Signed   By: Dorise Bullion III M.D.   On: 08/28/2021 15:51   DG Chest Port 1 View  Result Date: 08/28/2021 CLINICAL DATA:  Pleural effusion EXAM: PORTABLE CHEST 1 VIEW COMPARISON:  August 25, 2021 FINDINGS: Stable cardiomegaly. The hila and mediastinum are unchanged. No pneumothorax. No nodules or masses. A layering effusion on the right with underlying opacity is stable. A layering effusion on the left with underlying opacity is more prominent the interval. Mild increased interstitial markings. IMPRESSION: 1. The layering right pleural effusion with underlying opacity is stable. 2. The layering left pleural effusion with underlying opacity appears larger/more prominent in the interval. 3. Mild increased interstitial markings in the lungs suggests pulmonary venous  congestion/mild edema. Electronically Signed   By: Dorise Bullion III M.D.   On: 08/28/2021 07:45   DG Chest Port 1 View  Result Date: 08/25/2021 CLINICAL DATA:  Shortness of breath EXAM: PORTABLE CHEST 1 VIEW COMPARISON:  08/22/2021 FINDINGS: Unchanged AP portable chest radiograph. Cardiomegaly with diffuse bilateral interstitial pulmonary opacity and layering bilateral pleural effusions. No new airspace opacity. IMPRESSION: Cardiomegaly with diffuse bilateral interstitial pulmonary opacity and layering bilateral pleural effusions, consistent with edema. No new airspace opacity. Electronically Signed   By: Delanna Ahmadi M.D.   On: 08/25/2021 13:32   ECHOCARDIOGRAM LIMITED  Result Date: 08/25/2021    ECHOCARDIOGRAM LIMITED REPORT   Patient Name:   MEAGHANN CHOO Date of Exam: 08/25/2021 Medical Rec #:  [id number]        Height:       63.0 in Accession #:    [id number]       Weight:        205.9 lb Date of Birth:  [birth year]       BSA:          1.958 m Patient Age:    33 years         BP:           90/57 mmHg Patient Gender: F                HR:           95 bpm. Exam Location:  [facility] Procedure: Limited Echo, Limited Color Doppler and Cardiac Doppler Indications:     I31.3 Pericardial effusion  History:         Patient has prior history of Echocardiogram examinations, most                  recent 08/22/2021. HFpEF, CKD and COPD; Risk                  Factors:Hypertension, Diabetes and Dyslipidemia.  Sonographer:     Charmayne Sheer Referring Phys:  [id number] [place] Diagnosing Phys: Ida Rogue MD IMPRESSIONS  1. Left ventricular ejection fraction, by estimation, is 60 to 65%. The left ventricle has normal function. The left ventricle has no regional wall motion abnormalities. Left ventricular diastolic parameters are indeterminate.  2. Right ventricular systolic function is mildly reduced. The right ventricular size is mildly enlarged. There is moderately elevated pulmonary artery systolic pressure. The estimated right ventricular systolic pressure is 40.3 mmHg.  3. Moderate pleural effusion in the left lateral region estimated 6 cm.  4. The mitral valve is normal in structure. No evidence of mitral valve regurgitation. No evidence of mitral stenosis.  5. Tricuspid valve regurgitation is moderate.  6. The aortic valve was not well visualized. Aortic valve regurgitation is not visualized. No aortic stenosis is present.  7. The inferior vena cava is dilated in size with >50% respiratory variability, suggesting right atrial pressure of 8 mmHg. FINDINGS  Left Ventricle: Left ventricular ejection fraction, by estimation, is 60 to 65%. The left ventricle has normal function. The left ventricle has no regional wall motion abnormalities. The left ventricular internal cavity size was normal in size. There is  no left ventricular hypertrophy. Left ventricular diastolic parameters are indeterminate. Right  Ventricle: The right ventricular size is mildly enlarged. No increase in right ventricular wall thickness. Right ventricular systolic function is mildly reduced. There is moderately elevated pulmonary artery systolic pressure. The tricuspid regurgitant velocity is 3.37 m/s, and with an assumed right atrial pressure  of 10 mmHg, the estimated right ventricular systolic pressure is 73.2 mmHg. Left Atrium: Left atrial size was normal in size. Right Atrium: Right atrial size was normal in size. Pericardium: Trivial pericardial effusion is present. Mitral Valve: The mitral valve is normal in structure. No evidence of mitral valve stenosis. Tricuspid Valve: The tricuspid valve is normal in structure. Tricuspid valve regurgitation is moderate . No evidence of tricuspid stenosis. Aortic Valve: The aortic valve was not well visualized. Aortic valve regurgitation is not visualized. No aortic stenosis is present. Pulmonic Valve: The pulmonic valve was normal in structure. Pulmonic valve regurgitation is not visualized. No evidence of pulmonic stenosis. Aorta: The aortic root is normal in size and structure. Ascending aorta measurements are within normal limits for age when indexed to body surface area. Venous: The inferior vena cava is dilated in size with greater than 50% respiratory variability, suggesting right atrial pressure of 8 mmHg. IAS/Shunts: No atrial level shunt detected by color flow Doppler. Additional Comments: There is a moderate pleural effusion in the left lateral region. LEFT VENTRICLE PLAX 2D LVIDd:         3.76 cm LVIDs:         2.85 cm LV PW:         1.14 cm LV IVS:        0.81 cm  LEFT ATRIUM         Index LA diam:    3.60 cm 1.84 cm/m  TRICUSPID VALVE TR Peak grad:   45.4 mmHg TR Vmax:        337.00 cm/s Ida Rogue MD Electronically signed by Ida Rogue MD Signature Date/Time: 08/25/2021/1:28:06 PM    Final    ECHOCARDIOGRAM LIMITED  Result Date: 08/22/2021    ECHOCARDIOGRAM LIMITED REPORT    Patient Name:   MATALIE ROMBERGER Date of Exam: 08/22/2021 Medical Rec #:  [id number]        Height:       63.0 in Accession #:    [id number]       Weight:       206.3 lb Date of Birth:  [birth year]       BSA:          1.960 m Patient Age:    33 years         BP:           131/60 mmHg Patient Gender: F                HR:           93 bpm. Exam Location:  [facility] Procedure: Limited Echo, Color Doppler and Cardiac Doppler Indications:     Pericardial Effusion I31.3  History:         Patient has prior history of Echocardiogram examinations, most                  recent 08/20/2021. COPD; Risk Factors:Hypertension and Diabetes.                  HF with preserved EF.  Sonographer:     Sherrie Sport Referring Phys:  [id number] [place] Diagnosing Phys: Kathlyn Sacramento MD  Sonographer Comments: No apical window and suboptimal parasternal window. IMPRESSIONS  1. Left ventricular ejection fraction, by estimation, is 50 to 55%. The left ventricle has low normal function. The left ventricle has no regional wall motion abnormalities. Left ventricular diastolic function could not be evaluated.  2. Right ventricular systolic function is normal. The  right ventricular size is normal. There is moderately elevated pulmonary artery systolic pressure.  3. A small pericardial effusion is present. The pericardial effusion is circumferential. Moderate pleural effusion in the left lateral region.  4. The mitral valve is normal in structure. No evidence of mitral valve regurgitation. No evidence of mitral stenosis.  5. The aortic valve is normal in structure. Aortic valve regurgitation is not visualized. No aortic stenosis is present.  6. The inferior vena cava is dilated in size with <50% respiratory variability, suggesting right atrial pressure of 15 mmHg. FINDINGS  Left Ventricle: Left ventricular ejection fraction, by estimation, is 50 to 55%. The left ventricle has low normal function. The left ventricle has no regional wall motion  abnormalities. The left ventricular internal cavity size was normal in size. There is no left ventricular hypertrophy. Left ventricular diastolic function could not be evaluated. Right Ventricle: The right ventricular size is normal. No increase in right ventricular wall thickness. Right ventricular systolic function is normal. There is moderately elevated pulmonary artery systolic pressure. The tricuspid regurgitant velocity is 3.18 m/s, and with an assumed right atrial pressure of 15 mmHg, the estimated right ventricular systolic pressure is 58.5 mmHg. Left Atrium: Left atrial size was normal in size. Right Atrium: Right atrial size was normal in size. Pericardium: A small pericardial effusion is present. The pericardial effusion is circumferential. Mitral Valve: The mitral valve is normal in structure. No evidence of mitral valve stenosis. Tricuspid Valve: The tricuspid valve is normal in structure. Tricuspid valve regurgitation is trivial. No evidence of tricuspid stenosis. Aortic Valve: The aortic valve is normal in structure. Aortic valve regurgitation is not visualized. No aortic stenosis is present. Pulmonic Valve: The pulmonic valve was normal in structure. Pulmonic valve regurgitation is not visualized. No evidence of pulmonic stenosis. Aorta: The aortic root is normal in size and structure. Venous: The inferior vena cava is dilated in size with less than 50% respiratory variability, suggesting right atrial pressure of 15 mmHg. IAS/Shunts: No atrial level shunt detected by color flow Doppler. Additional Comments: There is a moderate pleural effusion in the left lateral region. LEFT VENTRICLE PLAX 2D LVIDd:         3.50 cm LVIDs:         2.10 cm LV PW:         1.20 cm LV IVS:        0.90 cm  LEFT ATRIUM         Index LA diam:    3.60 cm 1.84 cm/m                        PULMONIC VALVE AORTA                 PV Vmax:        0.68 m/s Ao Root diam: 2.60 cm PV Vmean:       42.100 cm/s                       PV  VTI:         0.119 m                       PV Peak grad:   1.8 mmHg                       PV Mean grad:   1.0 mmHg  RVOT Peak grad: 3 mmHg  TRICUSPID VALVE TR Peak grad:   40.4 mmHg TR Vmax:        318.00 cm/s  SHUNTS Pulmonic VTI: 0.135 m Kathlyn Sacramento MD Electronically signed by Kathlyn Sacramento MD Signature Date/Time: 08/22/2021/4:08:36 PM    Final    DG Chest Port 1 View  Result Date: 08/22/2021 CLINICAL DATA:  S/p LEFT thoracentesis EXAM: PORTABLE CHEST 1 VIEW COMPARISON:  IR ultrasound, earlier same day and 08/16/2021. Chest XR, earlier same day. FINDINGS: Cardiac silhouette is enlarged and unchanged. Aortic arch calcifications. Low lung volumes. Interval removal of LEFT pleural fluid with trace residual. Small volume RIGHT pleural effusion is unchanged. No pneumothorax. Vascular congestion with perihilar interstitial thickening, and linear opacity at the LEFT lung base. No focal consolidation. No interval osseous abnormality IMPRESSION: 1. Interval removal of LEFT pleural fluid with trace residual. No pneumothorax 2. Otherwise unchanged pulmonary findings, with cardiomegaly and mild pulmonary edema with small volume RIGHT pleural effusion. Electronically Signed   By: Michaelle Birks M.D.   On: 08/22/2021 13:36   US THORACENTESIS ASP PLEURAL SPACE W/IMG GUIDE  Result Date: 08/22/2021 INDICATION: Symptomatic LEFT sided pleural effusion EXAM: US THORACENTESIS ASP PLEURAL SPACE W/IMG GUIDE COMPARISON:  Chest XR, earlier same day. IR ultrasound and CT chest, 08/16/2021. MEDICATIONS: None. COMPLICATIONS: None immediate. TECHNIQUE: Informed written consent was obtained from the the patient and/or patient's representative after a discussion of the risks, benefits and alternatives to treatment. A timeout was performed prior to the initiation of the procedure. Initial ultrasound scanning demonstrates a small volume LEFT pleural effusion. The lower chest was prepped and draped in the usual  sterile fashion. 1% lidocaine was used for local anesthesia. An ultrasound image was saved for documentation purposes. An 8 Fr Safe-T-Centesis catheter was introduced. The thoracentesis was performed. The catheter was removed and a dressing was applied. The patient tolerated the procedure well without immediate post procedural complication. An immediate follow-up upright chest radiograph was requested. FINDINGS: A total of approximately 850 mL of serous fluid was removed. IMPRESSION: Successful ultrasound-guided LEFT therapeutic thoracentesis yielding 850 mL of pleural fluid. Michaelle Birks, MD [facility]iology Electronically Signed   By: Michaelle Birks M.D.   On: 08/22/2021 13:18   DG Chest Port 1 View  Result Date: 08/22/2021 CLINICAL DATA:  Hypoxia. EXAM: PORTABLE CHEST 1 VIEW COMPARISON:  08/21/2021 FINDINGS: 0410 hours. The cardio pericardial silhouette is enlarged. Bibasilar collapse/consolidation is similar to prior with bilateral pleural effusions, left greater than right also not substantially changed. Mild vascular congestion noted bilaterally with some improvement in aeration of the upper lungs. The visualized bony structures of the thorax are unremarkable. Telemetry leads overlie the chest. IMPRESSION: Stable exam. Bibasilar collapse/consolidation with bilateral pleural effusions, left greater than right. Electronically Signed   By: Misty Stanley M.D.   On: 08/22/2021 05:31   DG Chest Port 1 View  Result Date: 08/21/2021 CLINICAL DATA:  Dyspnea. EXAM: PORTABLE CHEST 1 VIEW COMPARISON:  08/16/2021 FINDINGS: Lordotic technique is demonstrated. Lungs are adequately inflated demonstrate hazy opacification over the mid to lower lungs bilaterally likely bilateral effusions, left worse than right with associated basilar atelectasis. The left effusion demonstrate slight interval worsening. Infection or edema over the mid lungs is also possible. Stable  cardiomegaly. Remainder of the exam is unchanged. IMPRESSION: 1. Hazy opacification over the mid to lower lungs bilaterally likely effusions with associated basilar atelectasis. Slight interval worsening over the left effusion. Infection or edema over the  mid lungs is also possible. 2. Stable cardiomegaly. Electronically Signed   By: Marin Olp M.D.   On: 08/21/2021 08:42   ECHOCARDIOGRAM LIMITED  Result Date: 08/20/2021    ECHOCARDIOGRAM LIMITED REPORT   Patient Name:   ASHLY YEPEZ Date of Exam: 08/20/2021 Medical Rec #:  [id number]        Height:       63.0 in Accession #:    [id number]       Weight:       205.4 lb Date of Birth:  1946-02-26       BSA:          1.956 m Patient Age:    11 years         BP:           111/43 mmHg Patient Gender: F                HR:           68 bpm. Exam Location:  [facility] Procedure: Limited Echo and Cardiac Doppler Indications:     Pericardial Effusion I31.3  History:         Patient has prior history of Echocardiogram examinations, most                  recent 08/16/2021.  Sonographer:     Kathlen Brunswick RDCS Referring Phys:  Animas Diagnosing Phys: Kate Sable MD IMPRESSIONS  1. Left ventricular ejection fraction, by estimation, is 55 to 60%. The left ventricle has normal function.  2. Right ventricular systolic function is low normal.  3. A small pericardial effusion is present. Large pleural effusion in the left lateral region.  4. The aortic valve is calcified.  5. The inferior vena cava is dilated in size with <50% respiratory variability, suggesting right atrial pressure of 15 mmHg. FINDINGS  Left Ventricle: Left ventricular ejection fraction, by estimation, is 55 to 60%. The left ventricle has normal function. The left ventricular internal cavity size was normal in size. There is no left ventricular hypertrophy. Right Ventricle: No increase in right ventricular wall thickness. Right ventricular systolic function is low normal.  Pericardium: A small pericardial effusion is present. Aortic Valve: The aortic valve is calcified. Venous: The inferior vena cava is dilated in size with less than 50% respiratory variability, suggesting right atrial pressure of 15 mmHg. Additional Comments: There is a large pleural effusion in the left lateral region. LEFT VENTRICLE PLAX 2D LVIDd:         5.15 cm LVIDs:         3.82 cm LV PW:         1.12 cm LV IVS:        0.98 cm  LEFT ATRIUM         Index LA diam:    4.60 cm 2.35 cm/m Kate Sable MD Electronically signed by Kate Sable MD Signature Date/Time: 08/20/2021/12:12:38 PM    Final    US THORACENTESIS ASP PLEURAL SPACE W/IMG GUIDE  Result Date: 08/17/2021 INDICATION: Left pleural effusion EXAM: ULTRASOUND GUIDED LEFT THORACENTESIS MEDICATIONS: None. COMPLICATIONS: None immediate. PROCEDURE: An ultrasound guided thoracentesis was thoroughly discussed with the patient and questions answered. The benefits, risks, alternatives and complications were also discussed. The patient understands and wishes to proceed with the procedure. Written consent was obtained. Ultrasound was performed to localize and mark an adequate pocket of fluid in the left chest. The area was then prepped and draped in the  normal sterile fashion. 1% Lidocaine was used for local anesthesia. Under ultrasound guidance a 19 gauge Yueh catheter was introduced. Thoracentesis was performed. The catheter was removed and a dressing applied. FINDINGS: A total of approximately 850 mL of clear, straw-colored fluid was removed. IMPRESSION: Successful ultrasound guided left thoracentesis yielding 850 mL of clear pleural fluid. Electronically Signed   By: Albin Felling M.D.   On: 08/17/2021 16:25   ECHOCARDIOGRAM LIMITED  Result Date: 08/16/2021    ECHOCARDIOGRAM LIMITED REPORT   Patient Name:   UNA YEOMANS Date of Exam: 08/16/2021 Medical Rec #:  [id number]        Height:       63.0 in Accession #:    [id number]       Weight:        206.1 lb Date of Birth:  [birth year]       BSA:          1.959 m Patient Age:    1 years         BP:           104/63 mmHg Patient Gender: F                HR:           91 bpm. Exam Location:  [facility] Procedure: Limited Color Doppler and Limited Echo Indications:     I31.3 Pericardial effusion  History:         Patient has prior history of Echocardiogram examinations, most                  recent 08/04/2021. CHF, COPD and Pulmonary HTN; Risk                  Factors:Former Smoker, Diabetes and Hypertension.  Sonographer:     Rosalia Hammers Referring Phys:  [phone] CHRISTOPHER END Diagnosing Phys: Nelva Bush MD  Sonographer Comments: Image acquisition challenging due to patient body habitus, Image acquisition challenging due to COPD and Image acquisition challenging due to respiratory motion. IMPRESSIONS  1. Left ventricular ejection fraction, by estimation, is 55 to 60%. The left ventricle has normal function. The left ventricle has no regional wall motion abnormalities. There is moderate left ventricular hypertrophy.  2. Right ventricular systolic function is moderately reduced. The right ventricular size is normal. Moderately increased right ventricular wall thickness. There is severely elevated pulmonary artery systolic pressure.  3. Moderate pericardial effusion. The pericardial effusion is circumferential, though the largest fluid collections are inferolateral to the LV and adjacent to the RA. There is no definite evidence of tamponade physiology, though evaluation is limited without mitral/tricuspid valve Doppler waveforms to assess for respiratory variation.  4. Mild mitral valve regurgitation.  5. Tricuspid valve regurgitation is moderate.  6. The aortic valve has an indeterminant number of cusps. There is moderate calcification of the aortic valve. There is moderate thickening of the aortic valve.  7. The inferior vena cava is dilated in size with <50% respiratory variability, suggesting right atrial  pressure of 15 mmHg. FINDINGS  Left Ventricle: Left ventricular ejection fraction, by estimation, is 55 to 60%. The left ventricle has normal function. The left ventricle has no regional wall motion abnormalities. The left ventricular internal cavity size was normal in size. There is  moderate left ventricular hypertrophy. Right Ventricle: The right ventricular size is normal. Moderately increased right ventricular wall thickness. Right ventricular systolic function is moderately reduced. There is severely elevated pulmonary artery systolic pressure. The tricuspid  regurgitant velocity is 3.46 m/s, and with an assumed right atrial pressure of 15 mmHg, the estimated right ventricular systolic pressure is 70.7 mmHg. Pericardium: A moderately sized pericardial effusion is present. The pericardial effusion is circumferential, though the largest fluid collections are inferolateral to the LV and adjacent to the RA. Mitral Valve: Mild mitral valve regurgitation. Tricuspid Valve: The tricuspid valve is normal in structure. Tricuspid valve regurgitation is moderate. Aortic Valve: The aortic valve has an indeterminant number of cusps. There is moderate calcification of the aortic valve. There is moderate thickening of the aortic valve. Pulmonic Valve: The pulmonic valve was normal in structure. Pulmonic valve regurgitation is trivial. No evidence of pulmonic stenosis. Venous: The inferior vena cava is dilated in size with less than 50% respiratory variability, suggesting right atrial pressure of 15 mmHg. IAS/Shunts: No atrial level shunt detected by color flow Doppler. LEFT VENTRICLE PLAX 2D LVIDd:         3.12 cm LVIDs:         2.03 cm LV PW:         1.54 cm LV IVS:        1.46 cm  RIGHT VENTRICLE RV Basal diam:  3.67 cm LEFT ATRIUM         Index LA diam:    4.10 cm 2.09 cm/m  PULMONIC VALVE PV Vmax:          1.08 m/s PV Vmean:         68.300 cm/s PV VTI:           0.142 m PV Peak grad:     4.7 mmHg PV Mean grad:     2.0  mmHg PR End Diast Vel: 2.50 msec  TRICUSPID VALVE TR Peak grad:   47.9 mmHg TR Vmax:        346.00 cm/s Nelva Bush MD Electronically signed by Nelva Bush MD Signature Date/Time: 08/16/2021/5:30:44 PM    Final (Updated)    DG Chest Port 1 View  Result Date: 08/16/2021 CLINICAL DATA:  Post thoracentesis EXAM: PORTABLE CHEST 1 VIEW COMPARISON:  08/15/2021, CT 08/16/2021 FINDINGS: Decreased left pleural effusion post thoracentesis. No pneumothorax. Cardiomegaly with vascular congestion and hazy edema or atelectasis at the bases. Small residual pleural effusions. IMPRESSION: 1. Decreased left effusion post thoracentesis.  No pneumothorax 2. Cardiomegaly with vascular congestion and at least small residual bilateral effusions. Hazy edema or atelectasis at the bases Electronically Signed   By: Donavan Foil M.D.   On: 08/16/2021 17:20   CT CHEST WO CONTRAST  Result Date: 08/16/2021 CLINICAL DATA:  Pneumonia, CHF EXAM: CT CHEST WITHOUT CONTRAST TECHNIQUE: Multidetector CT imaging of the chest was performed following the standard protocol without IV contrast. RADIATION DOSE REDUCTION: This exam was performed according to the departmental dose-optimization program which includes automated exposure control, adjustment of the mA and/or kV according to patient size and/or use of iterative reconstruction technique. COMPARISON:  Previous CT done on 02/05/2021 and chest radiograph done on 08/15/2021 FINDINGS: Cardiovascular: There is interval appearance of moderate to large pericardial effusion. There are scattered coronary artery calcifications. Mediastinum/Nodes: There are slightly enlarged lymph nodes in the mediastinum with no significant interval change. Lungs/Pleura: There is interval decrease in size of moderate right pleural effusion. There is large left pleural effusion with significant interval increase. Infiltrates are seen in the posterior aspect of left mid and both lower lung fields, more so on the  left side suggesting compression atelectasis and possibly pneumonia. There is no pneumothorax. Upper  Abdomen: Unremarkable. Musculoskeletal: Unremarkable. IMPRESSION: There is interval appearance of moderate to large pericardial effusion. Coronary artery calcifications are seen. Moderate to large bilateral pleural effusions, more so on the left side. There is significant interval increase in amount of left pleural effusion. Infiltrates are noted in the posterior left mid and both lower lung fields, more so on the left side suggesting compression atelectasis and possibly underlying pneumonia. Electronically Signed   By: Elmer Picker M.D.   On: 08/16/2021 15:24   DG Chest Port 1 View  Result Date: 08/15/2021 CLINICAL DATA:  Dyspnea. EXAM: PORTABLE CHEST 1 VIEW COMPARISON:  08/14/2021 FINDINGS: Stable marked cardiomegaly. Stable pulmonary vascular congestion. Persistent opacification of left retrocardiac lung base is seen which may be due to atelectasis or pneumonia. IMPRESSION: Stable cardiomegaly and pulmonary vascular congestion. Persistent left retrocardiac atelectasis versus pneumonia. Electronically Signed   By: Marlaine Hind M.D.   On: 08/15/2021 12:57   DG Chest Portable 1 View  Result Date: 08/14/2021 CLINICAL DATA:  Chest pain, pneumonia EXAM: PORTABLE CHEST 1 VIEW COMPARISON:  None Available. FINDINGS: Cardiac silhouette appears increased in volume. Central venous congestion increased. Increased LEFT lower lobe density. No pneumothorax. IMPRESSION: 1. Increase in cardiac silhouette and venous congestion suggest congestive heart failure. 2. LEFT lobe atelectasis versus pneumonia. Electronically Signed   By: Suzy Bouchard M.D.   On: 08/14/2021 16:31    Microbiology: Results for orders placed or performed during the hospital encounter of 08/14/21  Blood culture (routine x 2)     Status: None   Collection Time: 08/14/21  4:53 PM   Specimen: BLOOD  Result Value Ref Range Status   Specimen  Description BLOOD [facility]  Final   Special Requests BOTTLES DRAWN AEROBIC AND ANAEROBIC BCLV  Final   Culture   Final    NO GROWTH 5 DAYS Performed at [facility], [address], [place], [place] [address]    Report Status 08/19/2021 FINAL  Final  Blood culture (routine x 2)     Status: None   Collection Time: 08/14/21  4:55 PM   Specimen: BLOOD  Result Value Ref Range Status   Specimen Description BLOOD RIGHT ANTECUBITAL  Final   Special Requests   Final    BOTTLES DRAWN AEROBIC AND ANAEROBIC Blood Culture adequate volume   Culture   Final    NO GROWTH 5 DAYS Performed at [facility], [address] [place], [place] [address]    Report Status 08/19/2021 FINAL  Final  MRSA Next Gen by PCR, Nasal     Status: None   Collection Time: 08/15/21  5:50 PM   Specimen: Nasal Mucosa; Nasal Swab  Result Value Ref Range Status   MRSA by PCR Next Gen NOT DETECTED NOT DETECTED Final    Comment: (NOTE) The GeneXpert MRSA Assay (FDA approved for NASAL specimens only), is one component of a comprehensive MRSA colonization surveillance program. It is not intended to diagnose MRSA infection nor to guide or monitor treatment for MRSA infections. Test performance is not FDA approved in patients less than 64 years old. Performed at [facility], [address] [place], [place] [address]   Body fluid culture w Gram Stain     Status: None   Collection Time: 08/16/21  4:20 PM   Specimen: PATH Cytology Pleural fluid  Result Value Ref Range Status   Specimen Description   Final    PLEURAL Performed at [facility], [address] [place], [place] [address]  Special Requests   Final    PLEURAL Performed at [facility], [place], [place] [address]    Gram Stain   Final    CYTOSPIN SMEAR WBC PRESENT,BOTH PMN AND MONONUCLEAR NO ORGANISMS SEEN    Culture   Final    NO GROWTH 3 DAYS Performed at [facility], Center  [address], [place], [place] [address]    Report Status 08/20/2021 FINAL  Final  Body fluid culture w Gram Stain     Status: None   Collection Time: 08/22/21 12:20 PM   Specimen: PATH Cytology Pleural fluid  Result Value Ref Range Status   Specimen Description   Final    PLEURAL Performed at [facility], [address] [place], [place] [address]    Special Requests   Final    PLEURAL Performed at [facility], [address] [place], [place] [address]    Gram Stain   Final    FEW WBC PRESENT,BOTH PMN AND MONONUCLEAR NO ORGANISMS SEEN    Culture   Final    NO GROWTH 3 DAYS Performed at [facility], [place] [address] [place], [place] [address]    Report Status 08/25/2021 FINAL  Final    Labs: CBC: Recent Labs  Lab 09/01/21 0545 09/02/21 0522 09/06/21 0515  WBC 16.3* 12.7* 10.8*  HGB 10.8* 10.8* 10.9*  HCT 33.7* 34.1* 34.1*  MCV 85.3 85.0 84.4  PLT 391 391 756   Basic Metabolic Panel: Recent Labs  Lab 09/03/21 0553 09/04/21 0442 09/05/21 0557 09/06/21 0515 09/07/21 0634  NA 133* 129* 134* 132* 133*  K 3.6 4.0 4.6 4.1 4.3  CL 82* 84* 85* 86* 89*  CO2 39* 36* 38* 34* 34*  GLUCOSE 96 112* 163* 144* 183*  BUN 59* 65* 62* 61* 62*  CREATININE 1.28* 1.19* 1.37* 1.42* 1.39*  CALCIUM 8.9 8.6* 9.3 9.0 9.2   Liver Function Tests: No results for input(s): "AST", "ALT", "ALKPHOS", "BILITOT", "PROT", "ALBUMIN" in the last 168 hours. CBG: Recent Labs  Lab 09/06/21 1422 09/06/21 1642 09/06/21 2023 09/06/21 2321 09/07/21 0801  GLUCAP 252* 269* 310* 239* 184*    Discharge time spent: less than 30 minutes.  Signed: Annita Brod, MD [facility] 09/07/2021

## 2021-09-07 NOTE — TOC Transition Note (Signed)
Transition of Care [facility]) - CM/SW Discharge Note   Patient Details  Name: Karen [name] MRN: [id number] Date of Birth: [birth year]  Transition of [facility] CM/SW Contact:  Laurena Slimmer, RN Phone Number: 09/07/2021, 12:17 PM   Clinical Narrative:    Damaris Schooner with Sharyn Lull in admission at [facility] to confirm discharge today. Was provided room assignment. Husband contacted. Discharge and transfer summaries sent in [place]. EMS arranged. Nurse notified. TOC signing off.         Patient Goals and CMS Choice        Discharge Placement                       Discharge Plan and Services                                     Social Determinants of Health (SDOH) Interventions     Readmission Risk Interventions    10/30/2020    4:17 PM 06/21/2020    3:03 PM 09/30/2019   11:16 AM  Readmission Risk Prevention Plan  Transportation Screening Complete Complete Complete  PCP or Specialist Appt within 3-5 Days   Complete  Social Work Consult for [place] Planning/Counseling   Complete  Palliative Care Screening   Not Applicable  Medication Review Press photographer) Complete Complete Complete  PCP or Specialist appointment within 3-5 days of discharge Complete Complete   HRI or Home Care Consult Patient refused Complete   SW Recovery Care/Counseling Consult Complete Complete   Palliative Care Screening Not Applicable Not [place] Not Applicable Not Applicable

## 2021-09-16 ENCOUNTER — Encounter: Payer: Self-pay | Admitting: Family

## 2021-09-16 ENCOUNTER — Ambulatory Visit: Payer: Medicare Other | Attending: Family | Admitting: Family

## 2021-09-16 ENCOUNTER — Non-Acute Institutional Stay: Payer: Medicare Other | Admitting: Primary Care

## 2021-09-16 VITALS — BP 112/71 | HR 53 | Resp 16 | Ht 63.0 in

## 2021-09-16 DIAGNOSIS — Z794 Long term (current) use of insulin: Secondary | ICD-10-CM | POA: Diagnosis not present

## 2021-09-16 DIAGNOSIS — I10 Essential (primary) hypertension: Secondary | ICD-10-CM

## 2021-09-16 DIAGNOSIS — Z7984 Long term (current) use of oral hypoglycemic drugs: Secondary | ICD-10-CM | POA: Insufficient documentation

## 2021-09-16 DIAGNOSIS — E1136 Type 2 diabetes mellitus with diabetic cataract: Secondary | ICD-10-CM | POA: Diagnosis not present

## 2021-09-16 DIAGNOSIS — E785 Hyperlipidemia, unspecified: Secondary | ICD-10-CM | POA: Insufficient documentation

## 2021-09-16 DIAGNOSIS — Z9181 History of falling: Secondary | ICD-10-CM

## 2021-09-16 DIAGNOSIS — E119 Type 2 diabetes mellitus without complications: Secondary | ICD-10-CM

## 2021-09-16 DIAGNOSIS — I5032 Chronic diastolic (congestive) heart failure: Secondary | ICD-10-CM | POA: Diagnosis not present

## 2021-09-16 DIAGNOSIS — Z9981 Dependence on supplemental oxygen: Secondary | ICD-10-CM | POA: Diagnosis not present

## 2021-09-16 DIAGNOSIS — J449 Chronic obstructive pulmonary disease, unspecified: Secondary | ICD-10-CM

## 2021-09-16 DIAGNOSIS — R5383 Other fatigue: Secondary | ICD-10-CM | POA: Diagnosis present

## 2021-09-16 DIAGNOSIS — N183 Chronic kidney disease, stage 3 unspecified: Secondary | ICD-10-CM | POA: Diagnosis not present

## 2021-09-16 DIAGNOSIS — E1122 Type 2 diabetes mellitus with diabetic chronic kidney disease: Secondary | ICD-10-CM | POA: Insufficient documentation

## 2021-09-16 DIAGNOSIS — Z515 Encounter for palliative care: Secondary | ICD-10-CM | POA: Insufficient documentation

## 2021-09-16 DIAGNOSIS — D631 Anemia in chronic kidney disease: Secondary | ICD-10-CM | POA: Insufficient documentation

## 2021-09-16 DIAGNOSIS — N1832 Chronic kidney disease, stage 3b: Secondary | ICD-10-CM

## 2021-09-16 DIAGNOSIS — R059 Cough, unspecified: Secondary | ICD-10-CM | POA: Diagnosis not present

## 2021-09-16 DIAGNOSIS — I130 Hypertensive heart and chronic kidney disease with heart failure and stage 1 through stage 4 chronic kidney disease, or unspecified chronic kidney disease: Secondary | ICD-10-CM | POA: Diagnosis not present

## 2021-09-16 DIAGNOSIS — I272 Pulmonary hypertension, unspecified: Secondary | ICD-10-CM

## 2021-09-16 NOTE — Patient Instructions (Signed)
Continue weighing daily and call for an overnight weight gain of 3 pounds or more or a weekly weight gain of more than 5 pounds.  °

## 2021-09-16 NOTE — Progress Notes (Signed)
Patient ID: Karen [name], female    DOB: 1945-03-31, 76 y.o.   MRN: [id number]  Ms Uselton is a 76 y/o female with a history of HTN, CKD, DM, asthma, hyperlipidemia, COPD, anemia and chronic heart failure.    Echo report from 08/25/21 reviewed and showed an EF of 60-65% along with moderately elevated PA pressure of 55.4 mmHg, moderate left lateral pleural effusion and moderate TR. Echo report from 02/06/21 reviewed and showed an EF of 60-65%, no LVR. RV mildly enlarged, severely elevated PA systolic pressure. Echo report from 02/14/2019 reviewed and showed an EF of 65-70% without LVH along with trivial MR/TR and moderately elevated PA pressure.   Admitted 08/14/21 due to left sided chest pain, found to be in AF RVR and acute on chroinc heart failure. Treated with antibiotics for pneumonia. Cardiology, pulmonology and palliative care consults obtained. Needed high flow oxygen/ bipap. Had nemerous thoracentesis done. Discharged after 24 days.      Admitted 05/28/21 due to worsening SOB due to acute on chronic HF. Initially given IV lasix with transition to oral diuretics. Found to have sleep apnea and placed on auto PAP/CPAP with settings of 5-20 cmH20.Palliative care consult obtained. PT evaluation done. Discharged after 5 days.   She presents today for a follow-up visit with a chief complaint of minimal fatigue upon moderate exertion. She describes this as chronic in nature. She has associated cough, intermittent chest pain, pedal edema (improving) and chronic pain along with this. She denies any difficulty sleeping, dizziness, shortness of breath, palpitations or abdominal distention.   Wearing her oxygen at 3L around the clock. Still does not have her bipap.   Not being weighed [facility] at [facility].   Past Medical History:  Diagnosis Date   (HFpEF) heart failure with preserved ejection fraction ([place])    a. 2017 Echo: EF 50%; b. 06/2018 Echo: EF 50-55%; c. 08/2018 Echo: EF 50-55%, Nl RV fxn; d.  09/2019 Echo: EF 55-60%, no rwma, mild LVH, Gr1 DD, nl RV size/fxn, PASP 63.73mmHg. Mildly dil LA. Triv MR. Mod AS (AoV 0.94cm^2 VTI; mean grad 17.68mmHg); d. 02/2021 Echo: EF 60-65%, no rwma, GrI DD, mildly red RV fxn, RVSP 64.47mmHg, mild-mod MR, mod-sev TR, mild AS.   Acute on chronic respiratory failure with hypoxia and hypercapnia (HCC) 01/07/2015   Anemia    Asterixis 01/07/2015   Asthma    Cataract    CKD (chronic kidney disease), stage III (HCC)    COPD (chronic obstructive pulmonary disease) ([place])    a. 06/2018 tobacco use, home 3L oxygen    Diabetes mellitus without complication ([place])    a. 09/2019 A1C 8.8   Edema, peripheral 04/20/2014   GI bleed 06/28/2019   History of kidney stones    Hyperlipidemia    Hypertension    Iron deficiency anemia 06/22/2014   Junctional bradycardia    a. In setting of beta blocker therapy.   Leucocytosis 10/19/2015   Moderate aortic stenosis    a.  09/2019 Echo: Mod AS (AoV 0.94cm^2 VTI; mean grad 17.13mmHg); b. 02/2021 Echo: Mild AS.   Morbid obesity ([place])    Overactive bladder    Primary osteoarthritis of right knee 09/01/2016   Sciatica 01/07/2015   Valvular heart disease    a. 02/2021 Echo: EF 60-65%, no rwma, GrI DD, mild-mod MR, mod-sev TR, mild AS   Past Surgical History:  Procedure Laterality Date   APPENDECTOMY     CESAREAN SECTION     x3  CHOLECYSTECTOMY     COLONOSCOPY WITH PROPOFOL N/A 08/28/2017   Procedure: COLONOSCOPY WITH PROPOFOL;  Surgeon: Lucilla Lame, MD;  Location: [facility] ENDOSCOPY;  Service: Endoscopy;  Laterality: N/A;   COLONOSCOPY WITH PROPOFOL N/A 08/29/2017   Procedure: COLONOSCOPY WITH PROPOFOL;  Surgeon: Lucilla Lame, MD;  Location: [facility] ENDOSCOPY;  Service: Endoscopy;  Laterality: N/A;   CYSTOSCOPY W/ URETERAL STENT PLACEMENT Right 09/15/2017   Procedure: CYSTOSCOPY WITH RETROGRADE PYELOGRAM/URETERAL STENT PLACEMENT;  Surgeon: Cleon Gustin, MD;  Location: [facility] ORS;  Service: Urology;  Laterality: Right;    CYSTOSCOPY/URETEROSCOPY/HOLMIUM LASER/STENT PLACEMENT Right 10/09/2017   Procedure: CYSTOSCOPY/URETEROSCOPY/HOLMIUM LASER/STENT PLACEMENT;  Surgeon: Abbie Sons, MD;  Location: [facility] ORS;  Service: Urology;  Laterality: Right;  right Stent exchange   ESOPHAGOGASTRODUODENOSCOPY (EGD) WITH PROPOFOL N/A 09/16/2018   Procedure: ESOPHAGOGASTRODUODENOSCOPY (EGD) WITH PROPOFOL;  Surgeon: Lin Landsman, MD;  Location: [place];  Service: Gastroenterology;  Laterality: N/A;   EYE SURGERY     IR THORACENTESIS ASP PLEURAL SPACE W/IMG GUIDE  02/07/2021   Family History  Problem Relation Age of Onset   Other Mother        unknown medical history   Other Father        unknown medical history   Social History   Tobacco Use   Smoking status: Former    Packs/day: 1.00    Years: 20.00    Total pack years: 20.00    Types: Cigarettes    Quit date: 12/04/1992    Years since quitting: 28.8   Smokeless tobacco: Never  Substance Use Topics   Alcohol use: No   Allergies  Allergen Reactions   Ace Inhibitors Hives   Beta Adrenergic Blockers     Junctional bradycardia   Gabapentin Hives   Lisinopril Hives   Lyrica [Pregabalin] Hives   Shrimp [Shellfish Allergy] Swelling    Swelling of the lips   Prior to Admission medications   Medication Sig Start Date End Date Taking? Authorizing Provider  acetaminophen (TYLENOL) 500 MG tablet Take 1-2 tablets (500-1,000 mg total) by mouth every 6 (six) hours as needed for mild pain, fever, moderate pain or headache. Do not take more than 4 grams a day 02/05/21  Yes Gonfa, Taye T, MD  albuterol (VENTOLIN HFA) 108 (90 Base) MCG/ACT inhaler Inhale 2 puffs into the lungs every 6 (six) hours as needed for wheezing or shortness of breath.    Yes [provider]  allopurinol (ZYLOPRIM) 100 MG tablet Take 1 tablet by mouth daily.   Yes [provider]  apixaban (ELIQUIS) 5 MG TABS tablet Take 1 tablet by mouth twice daily 04/06/21  Yes Gollan,  Kathlene November, MD  atorvastatin (LIPITOR) 10 MG tablet Take 1 tablet by mouth daily. 12/17/20  Yes [provider]  empagliflozin (JARDIANCE) 10 MG TABS tablet Take 1 tablet (10 mg total) by mouth daily. 08/09/21  Yes Annita Brod, MD  esomeprazole (NEXIUM) 40 MG capsule Take 40 mg by mouth daily. 12/17/18  Yes [provider]  Ferrous Sulfate (IRON) 325 (65 Fe) MG TABS Take 1 tablet (325 mg total) by mouth daily. 02/12/21  Yes Wieting, Richard, MD  fluticasone (FLONASE) 50 MCG/ACT nasal spray Place 2 sprays into both nostrils daily. 11/15/18  Yes [provider]  guaiFENesin (MUCINEX) 600 MG 12 hr tablet Take 600 mg by mouth daily.   Yes [provider]  insulin glargine (LANTUS) 100 UNIT/ML injection Inject 24 Units into the skin 2 (two) times daily with a  meal.   Yes [provider]  insulin lispro (HUMALOG) 100 UNIT/ML KwikPen Inject 12 Units into the skin in the morning, at noon, and at bedtime. 10 units with meals plus additional units for correction Patient taking differently: Inject 10 Units into the skin in the morning, at noon, and at bedtime. 10 units with meals plus additional units for correction 09/07/21  Yes Annita Brod, MD  insulin regular (NOVOLIN R) 100 units/mL injection Inject 2-14 Units into the skin 3 (three) times daily before meals. Blood Sugar 201-250.. Give 2 units Blood Sugar 250-300. Give 4 units  Blood Sugar 301-350. Give 6 units Blood Sugar 351-400.  Give 8 units Blood Sugar 401-450 . Give 10 units Blood Sugar 451-500 Give 12 units Blood Sugar <500 Give 14 units   Yes [provider]  levalbuterol (XOPENEX) 0.63 MG/3ML nebulizer solution Take 3 mLs (0.63 mg total) by nebulization every 8 (eight) hours as needed for wheezing or shortness of breath. 09/07/21  Yes Annita Brod, MD  lidocaine (LIDODERM) 5 % Place 2 patches onto the skin daily. Remove & Discard patch within 12 hours or as directed by MD 09/07/21   Yes Annita Brod, MD  melatonin 5 MG TABS Take 5 mg by mouth at bedtime. 11/10/20  Yes [provider]  montelukast (SINGULAIR) 10 MG tablet Take 10 mg by mouth at bedtime.   Yes [provider]  oxyCODONE (OXY IR/ROXICODONE) 5 MG immediate release tablet Take 1 tablet (5 mg total) by mouth every 6 (six) hours as needed for moderate pain. 09/07/21  Yes Annita Brod, MD  senna-docusate (SENOKOT-S) 8.6-50 MG tablet Take 1 tablet by mouth 2 (two) times daily between meals as needed for mild constipation. 02/05/21  Yes Mercy Riding, MD  spironolactone (ALDACTONE) 25 MG tablet Take 1 tablet (25 mg total) by mouth daily. 09/07/21  Yes Annita Brod, MD  torsemide (DEMADEX) 20 MG tablet Take 20 mg by mouth 2 (two) times daily.   Yes [provider]  traZODone (DESYREL) 50 MG tablet Take 1 tablet (50 mg total) by mouth at bedtime as needed for sleep. 02/12/21  Yes Wieting, Richard, MD  vitamin B-12 (CYANOCOBALAMIN) 500 MCG tablet Take 500 mcg by mouth daily.   Yes [provider]  Vitamin D, Cholecalciferol, 25 MCG (1000 UT) CAPS Take 1 tablet by mouth daily.   Yes [provider]    Review of Systems  Constitutional:  Positive for fatigue. Negative for appetite change.  HENT:  Negative for congestion, postnasal drip and sore throat.   Eyes: Negative.   Respiratory:  Positive for cough. Negative for choking and shortness of breath.   Cardiovascular:  Positive for chest pain ("little bit") and leg swelling (improving). Negative for palpitations.  Gastrointestinal:  Negative for abdominal distention and abdominal pain.  Endocrine: Negative.   Genitourinary: Negative.   Musculoskeletal:  Positive for arthralgias and back pain. Negative for neck pain.  Allergic/Immunologic: Negative.   Neurological:  Negative for dizziness, weakness and light-headedness.  Hematological:  Negative for adenopathy. Does not bruise/bleed easily.   Psychiatric/Behavioral:  Negative for dysphoric mood and sleep disturbance (sleeping on 1 pillow; sleeping with oxygen @ 3L). The patient is not nervous/anxious.    Vitals:   09/16/21 1131  BP: 112/71  Pulse: (!) 53  Resp: 16  SpO2: 98%  Height: 5\' 3"  (1.6 m)   Wt Readings from Last 3 Encounters:  09/07/21 200 lb 9.9 oz (91 kg)  08/10/21 199 lb 8.3 oz (90.5 kg)  07/14/21 210 lb 9.6 oz (95.5 kg)   Lab Results  Component Value Date   CREATININE 1.39 (H) 09/07/2021   CREATININE 1.42 (H) 09/06/2021   CREATININE 1.37 (H) 09/05/2021   Physical Exam Vitals and nursing note reviewed. Exam conducted with a chaperone present.  Constitutional:      General: She is not in acute distress.    Appearance: Normal appearance. She is not toxic-appearing.  HENT:     Head: Normocephalic and atraumatic.  Cardiovascular:     Rate and Rhythm: Regular rhythm. Bradycardia present.     Pulses: Normal pulses.     Heart sounds: No murmur heard. Pulmonary:     Effort: Pulmonary effort is normal. No respiratory distress.     Breath sounds: No wheezing or rales.  Abdominal:     General: There is no distension.     Palpations: Abdomen is soft.     Tenderness: There is no abdominal tenderness.  Musculoskeletal:        General: No tenderness.     Cervical back: Normal range of motion.     Right lower leg: Edema (trace pitting) present.     Left lower leg: Edema (trace pitting) present.  Skin:    General: Skin is warm and dry.  Neurological:     General: No focal deficit present.     Mental Status: She is alert and oriented to person, place, and time.  Psychiatric:        Mood and Affect: Mood normal.        Behavior: Behavior normal.   Assessment & Plan:  1: Chronic heart failure with preserved ejection fraction without structural changes- - NYHA class II - euvolemic today - order written for SNF to weigh daily and call for an overnight weight gain >2 pounds or a weekly weight gain of > 5  pounds - she did not want to stand to weigh in the office today - saw cardiology Rockey Situ) 05/13/21 - participating in paramedicine program - not adding salt to her food - receiving PT at the facility - BNP 08/14/21 was 85.0  2: Hypertension- - BP looks good (112/71) - saw PCP Matilde Bash) 06/17/21, now seeing PCP at facility - [facility] 09/07/21 reviewed and showed sodium 133, potassium 4.3, creatinine 1.39 & GFR 39  3: DM- - saw endocrinology Honor Junes) 04/14/21 - A1c 08/08/21 was 9.1% - glucose at facility today was 211  4: COPD/ PAH- - wearing oxygen at 3L around the clock - palliative care visit on 09/16/21 - still waiting to start bipap at facility   Facility medication list reviewed.   Return in 4 months, sooner if needed.

## 2021-09-16 NOTE — Progress Notes (Signed)
[facility] Consult Note Telephone: [phone]  Fax: [phone]    Date of encounter: 09/16/21 10:37 AM PATIENT NAME: Karen [name] [address] [place] [address]   [phone] (home)  DOB: 04/09/1945 MRN: [id number] PRIMARY CARE PROVIDER:    Margarita Rana, MD,  [place] [address] [phone]  REFERRING PROVIDER:   Alvester Morin, MD [address] [place],  [place] [address] [phone]   RESPONSIBLE PARTY:    Contact Information     Name Relation Home Work Mobile   Karen [name] Spouse   [phone]   Karen [name] [phone]     [facility] Granddaughter [phone]        I met face to face with patient  in [facility] facility. Palliative Care was asked to follow this patient by consultation request of  Karen Rana, MD to address advance care planning and complex medical decision making. This is a follow up visit.                                   ASSESSMENT AND PLAN / RECOMMENDATIONS:   Advance Care Planning/Goals of Care: Goals include to maximize quality of life and symptom management. Patient/health care surrogate gave his/her permission to discuss.Our advance care planning conversation included a discussion about:     Exploration of personal, cultural or spiritual beliefs that might influence medical decisions  Identification of a healthcare agent -Husband Husband joined visit by phone I follow patient in the community in her home CODE STATUS: DNR  Symptom Management/Plan:  DM: Recent random sugars between 250 and 300 at SNF but had hypoglycemic episode on 7/13, 49 mg/dl.   Mobility: Having PT here at SNF, states she can walk "some". Has walker in room. PT notes state she can rise and stand for 30 seconds, and have done some reaching from sitting exercises. Her baseline several months ago was able to ambulate in home with walker and stand by assist.  Hypoxia:  Husband concerned pt did not have cpap, but she's been prescribed NIV now. He is arranging for this to be delivered to the home, once pt d/c from rehab services. Currently she is using oxygen at 3 L, continually, and education provided to elevate [facility] for better oxygenation. She is Alert and oriented x 3 at her baseline today.  Follow up Palliative Care Visit: Palliative care will continue to follow for complex medical decision making, advance care planning, and clarification of goals. Return 4 weeks or prn.  I spent 25 minutes providing this consultation. More than 50% of the time in this consultation was spent in counseling and care coordination.  PPS: 40%  HOSPICE ELIGIBILITY/DIAGNOSIS: TBD  Chief Complaint: debility, immobility  HISTORY OF PRESENT ILLNESS:  Karen [name] is a 76 y.o. year old female  with hypoxia, DM, COPD, oxygen dependence, immobility . Patient seen today to review palliative care needs to include medical decision making and advance care planning as appropriate.   History obtained from review of EMR, discussion with primary team, and interview with family, facility staff/caregiver and/or Karen [name].  I reviewed available labs, medications, imaging, studies and related documents from the EMR.  Records reviewed and summarized above.   ROS   General: NAD ENMT: denies dysphagia Cardiovascular: denies chest pain, denies  increased DOE Pulmonary: denies cough, denies increased SOB Abdomen: endorses good appetite, denies constipation,  endorses occ incontinence of bowel GU: denies dysuria, endorses occ incontinence of urine MSK:  denies  increased weakness,  no falls reported at SNF Skin: denies rashes or wounds Neurological:  endorses back pain, denies insomnia Psych: Endorses positive mood  Physical Exam: Current and past weights: 193 lbs Constitutional: NAD General: frail appearing EYES: anicteric sclera, lids intact, no discharge  ENMT: intact hearing, oral  mucous membranes moist, dentition missing CV: S1S2, RRR, 1+ bil  LE edema Pulmonary: LCTA,  slightly diminished, no increased work of breathing, no cough,  oxygen 3 L  Abdomen: intake 76-100%, soft and non tender, no ascites MSK: moderate  sarcopenia, moves all extremities,  non ambulatory Skin: warm and dry, no rashes or wounds on visible skin Neuro:  + generalized weakness,  no cognitive impairment, non-anxious affect  Thank you for the opportunity to participate in the care of Karen [name].  The palliative care team will continue to follow. Please call our office at [phone] if we can be of additional assistance.   Karen Coop, NP DNP, AGPCNP-BC  COVID-19 PATIENT SCREENING TOOL Asked and negative response unless otherwise noted:   Have you had symptoms of covid, tested positive or been in contact with someone with symptoms/positive test in the past 5-10 days?

## 2021-09-26 ENCOUNTER — Encounter (HOSPITAL_COMMUNITY): Payer: Self-pay

## 2021-09-26 NOTE — Progress Notes (Signed)
Karen [name] has been in the hospital several times and been discharged to a facility.  Been talking with husband a lot and she still does not have a trelegy machine.  Contacted [facility] and spoke with Teat the pulmonologist, he was able to get in touch with Zack and he advised never knew she was discharged.  They can not get her a machine till discharged from facility because they are responsible for providing her with one, which they have not.   [name] was discharged for facility on Friday 21st, [facility] has set up the machine and [name] is sleeping through the night and feeling good per her husband.  She has been using it nightly since home.  She also has Moms meals being delivered.  She has home health coming in to assist and Palliative NP visiting.  Have still not heard from agency accepting them for an aid, they are approved for one.  Her weight is 192 lbs, she is weighing daily.  She has all her medications and aware of up coming appts. Will continue to visit for heart failure, diet and medication management.   [place] [phone]

## 2021-10-12 ENCOUNTER — Telehealth: Payer: Self-pay | Admitting: Primary Care

## 2021-10-12 NOTE — Telephone Encounter (Signed)
Ret'd call to patient's husband Ray and discussed scheduling a Palliative visit post discharge from [facility] and I have scheduled an In-home for 10/21/21 @ 12 Noon.

## 2021-10-12 NOTE — Telephone Encounter (Signed)
Attempted to contact patient to schedule a Palliative visit (post discharge home from [facility]), no answer - left message requesting a return call to schedule.

## 2021-10-17 ENCOUNTER — Ambulatory Visit: Payer: Medicare Other | Admitting: Family

## 2021-10-21 ENCOUNTER — Other Ambulatory Visit: Payer: Medicare Other | Admitting: Primary Care

## 2021-10-27 ENCOUNTER — Other Ambulatory Visit: Payer: Medicare Other | Admitting: Primary Care

## 2021-10-27 DIAGNOSIS — Z794 Long term (current) use of insulin: Secondary | ICD-10-CM

## 2021-10-27 DIAGNOSIS — E119 Type 2 diabetes mellitus without complications: Secondary | ICD-10-CM

## 2021-10-27 DIAGNOSIS — J9612 Chronic respiratory failure with hypercapnia: Secondary | ICD-10-CM

## 2021-10-27 DIAGNOSIS — Z515 Encounter for palliative care: Secondary | ICD-10-CM

## 2021-10-27 NOTE — Progress Notes (Signed)
[facility] Consult Note Telephone: [phone]  Fax: [phone]    Date of encounter: 10/27/21 12:12 PM PATIENT NAME: Karen [name] [address] [place] [address]   [phone] (home)  DOB: [birth year] MRN: [id number] PRIMARY CARE PROVIDER:    Margarita Rana, MD,  [place] [address] [phone]  REFERRING PROVIDER:   Margarita Rana, MD [address] [place],  [place] [address] [phone]  RESPONSIBLE PARTY:    Contact Information     Name Relation Home Work Mobile   Karen [name] Spouse   [phone]   Karen [name] [phone]     [facility] Granddaughter [phone]          I met face to face with patient and [facility]  home. Palliative Care was asked to follow this patient by consultation request of  Karen Rana, MD to address advance care planning and complex medical decision making. This is a follow up visit.                                   ASSESSMENT AND PLAN / RECOMMENDATIONS:   Advance Care Planning/Goals of Care: Goals include to maximize quality of life and symptom management. Patient/health care surrogate gave his/her permission to discuss.Our advance care planning conversation included a discussion about:    The value and importance of advance care planning  Exploration of personal, cultural or spiritual beliefs that might influence medical decisions  Exploration of goals of care in the event of a sudden injury or illness  Identification of a healthcare agent - Karen [name], husband creation of an  advance directive document . MOST also given,  will review. DNR completed, no longer has. CODE STATUS: DNR  Symptom Management/Plan:   DM: BG 263 4 hrs PC, on insulin SSI at meal times.Husband states he adjusts insulin as he feels needed.Likely could be better controlled but h/o hypoglycemia with errant readings. Husband states they are happy with this level of control to avoid hypo  events.  Mobility: no longer has PT at home, home from SNF 6 weeks ago. Uses walker, denies falls. Continue HEP  Dyspnea: Has cpap, oxygen, lungs clear, moving air. No cough. Continue oxygen, monitoring of weight for CHF control.  CHF: 186 lbs today, min LE edema. No report of increased DOE  Follow up Palliative Care Visit: Palliative care will continue to follow for complex medical decision making, advance care planning, and clarification of goals. Return 6-8 weeks or prn.  This visit was coded based on medical decision making (MDM).  PPS: 40%  HOSPICE ELIGIBILITY/DIAGNOSIS: no  Chief Complaint: Debility  HISTORY OF PRESENT ILLNESS:  Karen [name] is a 76 y.o. year old female  with COPD, oxygen dependence, debility. Patient seen today to review palliative care needs to include medical decision making and advance care planning as appropriate.   History obtained from review of EMR, discussion with primary team, and interview with family, facility staff/caregiver and/or Karen [name].  I reviewed available labs, medications, imaging, studies and related documents from the EMR.  Records reviewed and summarized above.   ROS   General: NAD EYES: denies vision changes ENMT: denies dysphagia Cardiovascular: denies chest pain, denies DOE Pulmonary: denies cough, denies increased SOB Abdomen: endorses good appetite, denies constipation, endorses continence of bowel GU: denies dysuria, endorses continence of urine MSK:  denies  increased weakness,  no  falls reported Skin: denies rashes or wounds Neurological: endorses back  pain, denies insomnia Psych: Endorses positive mood  Physical Exam: Current and past weights: 186 lbs Constitutional: NAD General: frail appearing EYES: anicteric sclera, lids intact, no discharge  ENMT: intact hearing, oral mucous membranes moist CV: S1S2, RRR, slight  bil LE edema Pulmonary: LCTA, no increased work of breathing, no cough,supplemental  oxygen Abdomen: intake 80%, normo-active BS + 4 quadrants, soft and non tender, no ascites MSK: mild sarcopenia, moves all extremities, ambulatory with walker  Skin: warm and dry, no rashes or wounds on visible skin Neuro:  + generalized weakness,  mild cognitive impairment, non-anxious affect   Thank you for the opportunity to participate in the care of Karen [name].  The palliative care team will continue to follow. Please call our office at [phone] if we can be of additional assistance.   Jason Coop, NP DNP, AGPCNP-BC  COVID-19 PATIENT SCREENING TOOL Asked and negative response unless otherwise noted:   Have you had symptoms of covid, tested positive or been in contact with someone with symptoms/positive test in the past 5-10 days?

## 2021-10-28 ENCOUNTER — Other Ambulatory Visit: Payer: Medicare Other | Admitting: Primary Care

## 2021-11-13 NOTE — Progress Notes (Deleted)
Date:  11/13/2021   ID:  Karen [name], DOB [birth year], MRN [id number]  Patient Location:  [place] [id number]   Provider location:   [facility], [place] office  PCP:  Margarita Rana, MD  Cardiologist:  [facility]   No chief complaint on file.   History of Present Illness:    Karen [name] is a 76 y.o. 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 She presents for routine follow-up of her atrial fibrillation and heart failure  Last office visit 3/22  Hospitalized June 3976 with diastolic CHF  In hospital 12/22: CHF Right pleural effusion status postthoracentesis on 02/07/2021 with 600 mL removed.  Has scale at home:  Weight 214 at home  HGB 9.4 On iron pill CR 1.1, BUN 44 A1C 9.8  EKG personally reviewed by myself on todays visit  Echo 12/22  1. Left ventricular ejection fraction, by estimation, is 60 to 65%. The  left ventricle has normal function. The left ventricle has no regional  wall motion abnormalities. Left ventricular diastolic parameters are  consistent with Grade I diastolic  dysfunction (impaired relaxation).   2. Right ventricular systolic function is mildly reduced. The right  ventricular size is mildly enlarged. There is severely elevated pulmonary  artery systolic pressure. The estimated right ventricular systolic  pressure is 73.4 mmHg.   3. The mitral valve is normal in structure. Mild to moderate mitral valve  regurgitation. No evidence of mitral stenosis.   4. Tricuspid valve regurgitation is moderate to severe.   5. The aortic valve is normal in structure. Aortic valve regurgitation is  not visualized. Mild  aortic valve stenosis. Aortic valve mean gradient  measures 17.0 mmHg. Aortic valve Vmax measures 2.71 m/s.   6. The inferior vena cava is dilated in size with >50% respiratory  variability, suggesting right atrial pressure of 8 mmHg.   In the hospital March 2022 for sepsis Urinary tract infection, was hypotensive, sodium 129 potassium of 3 creatinine 1.84  Treated with broad-spectrum antibiotics, IV fluids In the hospital 06/2019,  Hyponatremia: Na 127, up to 133 with fluids Rectal bleeding: suspected this is diverticular bleed. Hgb 10.9 --> 10.0 -->9.3--8.7.  Colonoscopy 08/2017 - Diverticulosis in the descending colon, in the transverse colon and in the ascending colon. A1c 8.4,   Junctional bradycardia on arrival to the hospital with hypotension Markedly elevated right heart pressures on echocardiogram,severe pulmonary HTN,  BNP was 800  Treated with Lasix IV twice daily Poor renal function on arrival, suspected cardiorenal syndrome Improvement of renal function with diuresis Improvement of her abdominal bloating, cough improved, leg swelling improved    Past Medical History:  Diagnosis Date   (HFpEF) heart failure with preserved ejection fraction ([place])    a. 2017 Echo: EF 50%; b. 06/2018 Echo: EF 50-55%; c. 08/2018 Echo: EF 50-55%, Nl RV fxn; d. 09/2019 Echo: EF 55-60%, no rwma, mild LVH, Gr1 DD, nl RV size/fxn, PASP 63.23mmHg. Mildly dil LA. Triv MR. Mod AS (AoV 0.94cm^2 VTI; mean grad 17.78mmHg); d. 02/2021 Echo: EF 60-65%, no rwma, GrI DD, mildly red RV fxn, RVSP 64.29mmHg, mild-mod MR, mod-sev TR, mild AS.   Acute on chronic respiratory failure with  hypoxia and hypercapnia ([place]) 01/07/2015   Anemia    Asterixis 01/07/2015   Asthma    Cataract    CKD (chronic kidney disease), stage III (HCC)    COPD (chronic obstructive pulmonary disease) ([place])    a. 06/2018 tobacco use, home 3L oxygen    Diabetes mellitus without complication ([place])    a. 09/2019 A1C 8.8   Edema, peripheral  04/20/2014   GI bleed 06/28/2019   History of kidney stones    Hyperlipidemia    Hypertension    Iron deficiency anemia 06/22/2014   Junctional bradycardia    a. In setting of beta blocker therapy.   Leucocytosis 10/19/2015   Moderate aortic stenosis    a.  09/2019 Echo: Mod AS (AoV 0.94cm^2 VTI; mean grad 17.78mmHg); b. 02/2021 Echo: Mild AS.   Morbid obesity ([place])    Overactive bladder    Primary osteoarthritis of right knee 09/01/2016   Sciatica 01/07/2015   Valvular heart disease    a. 02/2021 Echo: EF 60-65%, no rwma, GrI DD, mild-mod MR, mod-sev TR, mild AS   Past Surgical History:  Procedure Laterality Date   APPENDECTOMY     CESAREAN SECTION     x3   CHOLECYSTECTOMY     COLONOSCOPY WITH PROPOFOL N/A 08/28/2017   Procedure: COLONOSCOPY WITH PROPOFOL;  Surgeon: Lucilla Lame, MD;  Location: [facility] ENDOSCOPY;  Service: Endoscopy;  Laterality: N/A;   COLONOSCOPY WITH PROPOFOL N/A 08/29/2017   Procedure: COLONOSCOPY WITH PROPOFOL;  Surgeon: Lucilla Lame, MD;  Location: [facility] ENDOSCOPY;  Service: Endoscopy;  Laterality: N/A;   CYSTOSCOPY W/ URETERAL STENT PLACEMENT Right 09/15/2017   Procedure: CYSTOSCOPY WITH RETROGRADE PYELOGRAM/URETERAL STENT PLACEMENT;  Surgeon: Cleon Gustin, MD;  Location: [facility] ORS;  Service: Urology;  Laterality: Right;   CYSTOSCOPY/URETEROSCOPY/HOLMIUM LASER/STENT PLACEMENT Right 10/09/2017   Procedure: CYSTOSCOPY/URETEROSCOPY/HOLMIUM LASER/STENT PLACEMENT;  Surgeon: Abbie Sons, MD;  Location: [facility] ORS;  Service: Urology;  Laterality: Right;  right Stent exchange   ESOPHAGOGASTRODUODENOSCOPY (EGD) WITH PROPOFOL N/A 09/16/2018   Procedure: ESOPHAGOGASTRODUODENOSCOPY (EGD) WITH PROPOFOL;  Surgeon: Lin Landsman, MD;  Location: [place];  Service: Gastroenterology;  Laterality: N/A;   EYE SURGERY     IR THORACENTESIS ASP PLEURAL SPACE W/IMG GUIDE  02/07/2021     No outpatient medications have been marked as taking for the 11/14/21 encounter  (Appointment) with [name], MD.     Allergies:   Ace inhibitors, Beta adrenergic blockers, Gabapentin, Lisinopril, Lyrica [pregabalin], and Shrimp [shellfish allergy]   Social History   Tobacco Use   Smoking status: Former    Packs/day: 1.00    Years: 20.00    Total pack years: 20.00    Types: Cigarettes    Quit date: 12/04/1992    Years since quitting: 28.9   Smokeless tobacco: Never  Vaping Use   Vaping Use: Never used  Substance Use Topics   Alcohol use: No   Drug use: No      Family Hx: The patient's family history includes Other in her father and mother.  ROS:   Please see the history of present illness.    Review of Systems  Constitutional: Negative.   HENT: Negative.    Respiratory:  Positive for shortness of breath.   Cardiovascular:  Positive for leg swelling.  Gastrointestinal: Negative.   Musculoskeletal: Negative.   Neurological: Negative.   Psychiatric/Behavioral: Negative.    All other systems reviewed and are negative.     Labs/Other Tests and Data Reviewed:  Recent Labs: 05/28/2021: TSH 3.386 08/14/2021: B Natriuretic Peptide 85.0 08/29/2021: Magnesium 2.1 08/31/2021: ALT 13 09/06/2021: Hemoglobin 10.9; Platelets 389 09/07/2021: BUN 62; Creatinine, Ser 1.39; Potassium 4.3; Sodium 133   Recent Lipid Panel No results found for: "CHOL", "TRIG", "HDL", "CHOLHDL", "LDLCALC", "LDLDIRECT"  Wt Readings from Last 3 Encounters:  09/07/21 200 lb 9.9 oz (91 kg)  08/10/21 199 lb 8.3 oz (90.5 kg)  07/14/21 210 lb 9.6 oz (95.5 kg)     Exam:    There were no vitals taken for this visit.  Constitutional:  oriented to person, place, and time. No distress.  Obese, presenting in a wheelchair HENT:  Head: Grossly normal Eyes:  no discharge. No scleral icterus.  Neck: No JVD, no carotid bruits  Cardiovascular: Regular rate and rhythm, no murmurs appreciated Trace lower extremity edema Pulmonary/Chest: Clear to auscultation bilaterally, no wheezes or  rales Abdominal: Soft.  no distension.  no tenderness.  Musculoskeletal: Normal range of motion Neurological:  normal muscle tone. Coordination normal. No atrophy Skin: Skin warm and dry Psychiatric: normal affect, pleasant   ASSESSMENT & PLAN:    Chronic diastolic heart failure (HCC) - Hospitalized 3 months ago for CHF exacerbation, COPD exacerbation Weight at goal 214,  Weight up 4 pounds Increase Lasix up to 40 twice daily, continue HCTZ BMP today  Essential hypertension - Blood pressure low Denies orthostasis symptoms  Type 2 diabetes mellitus without complication, with long-term current use of insulin ([place])  Long history of poor control diabetes, Long discussion with need to calorie restrict, work closely with endocrine History of morbid obesity  Junctional bradycardia - All beta-blockers, calcium channel blockers held, Maintaining normal sinus rhythm   Total encounter time more than 30 minutes  Greater than 50% was spent in counseling and coordination of care with the patient     Signed, Ida Rogue, MD  11/13/2021 1:48 PM    [place] [facility] [address], [place], [place] [address]

## 2021-11-14 ENCOUNTER — Ambulatory Visit: Payer: Medicare Other | Admitting: Cardiovascular Disease

## 2021-11-14 DIAGNOSIS — I482 Chronic atrial fibrillation, unspecified: Secondary | ICD-10-CM

## 2021-11-14 DIAGNOSIS — E1122 Type 2 diabetes mellitus with diabetic chronic kidney disease: Secondary | ICD-10-CM

## 2021-11-14 DIAGNOSIS — J9612 Chronic respiratory failure with hypercapnia: Secondary | ICD-10-CM

## 2021-11-14 DIAGNOSIS — E1165 Type 2 diabetes mellitus with hyperglycemia: Secondary | ICD-10-CM

## 2021-11-14 DIAGNOSIS — I5032 Chronic diastolic (congestive) heart failure: Secondary | ICD-10-CM

## 2021-11-14 DIAGNOSIS — J449 Chronic obstructive pulmonary disease, unspecified: Secondary | ICD-10-CM

## 2021-11-28 ENCOUNTER — Other Ambulatory Visit: Payer: Self-pay

## 2021-11-28 ENCOUNTER — Observation Stay: Payer: Medicare Other

## 2021-11-28 ENCOUNTER — Observation Stay
Admission: EM | Admit: 2021-11-28 | Discharge: 2021-11-29 | Disposition: A | Discharge status: Hospice | Payer: Medicare Other | Attending: Internal Medicine | Admitting: Internal Medicine

## 2021-11-28 ENCOUNTER — Emergency Department: Payer: Medicare Other

## 2021-11-28 ENCOUNTER — Encounter: Payer: Self-pay | Admitting: Internal Medicine

## 2021-11-28 DIAGNOSIS — I130 Hypertensive heart and chronic kidney disease with heart failure and stage 1 through stage 4 chronic kidney disease, or unspecified chronic kidney disease: Secondary | ICD-10-CM | POA: Diagnosis not present

## 2021-11-28 DIAGNOSIS — I5032 Chronic diastolic (congestive) heart failure: Secondary | ICD-10-CM | POA: Insufficient documentation

## 2021-11-28 DIAGNOSIS — I4821 Permanent atrial fibrillation: Secondary | ICD-10-CM | POA: Diagnosis not present

## 2021-11-28 DIAGNOSIS — I272 Pulmonary hypertension, unspecified: Secondary | ICD-10-CM | POA: Diagnosis present

## 2021-11-28 DIAGNOSIS — Z79899 Other long term (current) drug therapy: Secondary | ICD-10-CM | POA: Diagnosis not present

## 2021-11-28 DIAGNOSIS — Z6835 Body mass index (BMI) 35.0-35.9, adult: Secondary | ICD-10-CM | POA: Insufficient documentation

## 2021-11-28 DIAGNOSIS — Z7984 Long term (current) use of oral hypoglycemic drugs: Secondary | ICD-10-CM | POA: Insufficient documentation

## 2021-11-28 DIAGNOSIS — Z20822 Contact with and (suspected) exposure to covid-19: Secondary | ICD-10-CM | POA: Diagnosis not present

## 2021-11-28 DIAGNOSIS — R55 Syncope and collapse: Secondary | ICD-10-CM | POA: Diagnosis not present

## 2021-11-28 DIAGNOSIS — G4733 Obstructive sleep apnea (adult) (pediatric): Secondary | ICD-10-CM | POA: Diagnosis present

## 2021-11-28 DIAGNOSIS — E669 Obesity, unspecified: Secondary | ICD-10-CM | POA: Diagnosis present

## 2021-11-28 DIAGNOSIS — A419 Sepsis, unspecified organism: Secondary | ICD-10-CM | POA: Diagnosis present

## 2021-11-28 DIAGNOSIS — R531 Weakness: Secondary | ICD-10-CM

## 2021-11-28 DIAGNOSIS — I959 Hypotension, unspecified: Secondary | ICD-10-CM | POA: Insufficient documentation

## 2021-11-28 DIAGNOSIS — E1122 Type 2 diabetes mellitus with diabetic chronic kidney disease: Secondary | ICD-10-CM | POA: Diagnosis not present

## 2021-11-28 DIAGNOSIS — D72829 Elevated white blood cell count, unspecified: Secondary | ICD-10-CM | POA: Diagnosis present

## 2021-11-28 DIAGNOSIS — J449 Chronic obstructive pulmonary disease, unspecified: Secondary | ICD-10-CM | POA: Diagnosis not present

## 2021-11-28 DIAGNOSIS — N390 Urinary tract infection, site not specified: Secondary | ICD-10-CM | POA: Diagnosis not present

## 2021-11-28 DIAGNOSIS — R059 Cough, unspecified: Secondary | ICD-10-CM | POA: Diagnosis present

## 2021-11-28 DIAGNOSIS — Z7901 Long term (current) use of anticoagulants: Secondary | ICD-10-CM | POA: Insufficient documentation

## 2021-11-28 DIAGNOSIS — Z87891 Personal history of nicotine dependence: Secondary | ICD-10-CM | POA: Insufficient documentation

## 2021-11-28 DIAGNOSIS — J45909 Unspecified asthma, uncomplicated: Secondary | ICD-10-CM | POA: Insufficient documentation

## 2021-11-28 DIAGNOSIS — Z794 Long term (current) use of insulin: Secondary | ICD-10-CM | POA: Insufficient documentation

## 2021-11-28 DIAGNOSIS — E876 Hypokalemia: Secondary | ICD-10-CM | POA: Diagnosis present

## 2021-11-28 DIAGNOSIS — N1832 Chronic kidney disease, stage 3b: Secondary | ICD-10-CM | POA: Diagnosis not present

## 2021-11-28 DIAGNOSIS — I10 Essential (primary) hypertension: Secondary | ICD-10-CM | POA: Diagnosis present

## 2021-11-28 LAB — CBC WITH DIFFERENTIAL/PLATELET
Abs Immature Granulocytes: 0.1 10*3/uL — ABNORMAL HIGH (ref 0.00–0.07)
Basophils Absolute: 0.1 10*3/uL (ref 0.0–0.1)
Basophils Relative: 0 %
Eosinophils Absolute: 0.1 10*3/uL (ref 0.0–0.5)
Eosinophils Relative: 1 %
HCT: 36.9 % (ref 36.0–46.0)
Hemoglobin: 11.9 g/dL — ABNORMAL LOW (ref 12.0–15.0)
Immature Granulocytes: 1 %
Lymphocytes Relative: 11 %
Lymphs Abs: 1.5 10*3/uL (ref 0.7–4.0)
MCH: 27.5 pg (ref 26.0–34.0)
MCHC: 32.2 g/dL (ref 30.0–36.0)
MCV: 85.4 fL (ref 80.0–100.0)
Monocytes Absolute: 0.7 10*3/uL (ref 0.1–1.0)
Monocytes Relative: 5 %
Neutro Abs: 11.2 10*3/uL — ABNORMAL HIGH (ref 1.7–7.7)
Neutrophils Relative %: 82 %
Platelets: 276 10*3/uL (ref 150–400)
RBC: 4.32 MIL/uL (ref 3.87–5.11)
RDW: 16.4 % — ABNORMAL HIGH (ref 11.5–15.5)
WBC: 13.6 10*3/uL — ABNORMAL HIGH (ref 4.0–10.5)
nRBC: 0 % (ref 0.0–0.2)

## 2021-11-28 LAB — HEPATIC FUNCTION PANEL
ALT: 9 U/L (ref 0–44)
AST: 17 U/L (ref 15–41)
Albumin: 3.3 g/dL — ABNORMAL LOW (ref 3.5–5.0)
Alkaline Phosphatase: 69 U/L (ref 38–126)
Bilirubin, Direct: 0.1 mg/dL (ref 0.0–0.2)
Indirect Bilirubin: 0.6 mg/dL (ref 0.3–0.9)
Total Bilirubin: 0.7 mg/dL (ref 0.3–1.2)
Total Protein: 6.5 g/dL (ref 6.5–8.1)

## 2021-11-28 LAB — BASIC METABOLIC PANEL
Anion gap: 14 (ref 5–15)
BUN: 42 mg/dL — ABNORMAL HIGH (ref 8–23)
CO2: 31 mmol/L (ref 22–32)
Calcium: 8.8 mg/dL — ABNORMAL LOW (ref 8.9–10.3)
Chloride: 85 mmol/L — ABNORMAL LOW (ref 98–111)
Creatinine, Ser: 1.25 mg/dL — ABNORMAL HIGH (ref 0.44–1.00)
GFR, Estimated: 44 mL/min — ABNORMAL LOW (ref >60)
Glucose, Bld: 186 mg/dL — ABNORMAL HIGH (ref 70–99)
Potassium: 3.1 mmol/L — ABNORMAL LOW (ref 3.5–5.1)
Sodium: 130 mmol/L — ABNORMAL LOW (ref 135–145)

## 2021-11-28 LAB — TROPONIN I (HIGH SENSITIVITY)
Troponin I (High Sensitivity): 7 ng/L (ref <18)
Troponin I (High Sensitivity): 8 ng/L (ref <18)

## 2021-11-28 LAB — MAGNESIUM: Magnesium: 2 mg/dL (ref 1.7–2.4)

## 2021-11-28 LAB — SARS CORONAVIRUS 2 BY RT PCR: SARS Coronavirus 2 by RT PCR: NEGATIVE

## 2021-11-28 LAB — URINALYSIS, ROUTINE W REFLEX MICROSCOPIC
Bilirubin Urine: NEGATIVE
Glucose, UA: >=500 mg/dL — AB
Ketones, ur: NEGATIVE mg/dL
Nitrite: NEGATIVE
Protein, ur: NEGATIVE mg/dL
Specific Gravity, Urine: 1.003 — ABNORMAL LOW (ref 1.005–1.030)
WBC, UA: >50 WBC/hpf — ABNORMAL HIGH (ref 0–5)
pH: 6 (ref 5.0–8.0)

## 2021-11-28 LAB — GLUCOSE, CAPILLARY: Glucose-Capillary: 257 mg/dL — ABNORMAL HIGH (ref 70–99)

## 2021-11-28 LAB — BRAIN NATRIURETIC PEPTIDE: B Natriuretic Peptide: 29.4 pg/mL (ref 0.0–100.0)

## 2021-11-28 LAB — LACTIC ACID, PLASMA
Lactic Acid, Venous: 3.5 mmol/L (ref 0.5–1.9)
Lactic Acid, Venous: 2.1 mmol/L (ref 0.5–1.9)

## 2021-11-28 LAB — PROCALCITONIN: Procalcitonin: <0.1 ng/mL

## 2021-11-28 MED ORDER — VANCOMYCIN HCL IN DEXTROSE 1-5 GM/200ML-% IV SOLN: 1000.0000 mg | Freq: Once | INTRAVENOUS | Status: DC

## 2021-11-28 MED ORDER — TORSEMIDE 20 MG PO TABS
20.0000 mg | ORAL_TABLET | Freq: Two times a day (BID) | ORAL | Status: DC
  Administered 2021-11-29 (×2): 20 mg via ORAL
  Filled 2021-11-28 (×2): qty 1

## 2021-11-28 MED ORDER — ONDANSETRON HCL 4 MG/2ML IJ SOLN: 4.0000 mg | Freq: Four times a day (QID) | INTRAMUSCULAR | Status: DC | PRN

## 2021-11-28 MED ORDER — POTASSIUM CITRATE-CITRIC ACID 1100-334 MG/5ML PO SOLN
10.0000 meq | Freq: Three times a day (TID) | ORAL | Status: DC
  Administered 2021-11-28 – 2021-11-29 (×3): 10 meq via ORAL
  Filled 2021-11-28 (×7): qty 5

## 2021-11-28 MED ORDER — MONTELUKAST SODIUM 10 MG PO TABS
10.0000 mg | ORAL_TABLET | Freq: Every day | ORAL | Status: DC
  Administered 2021-11-28: 10 mg via ORAL
  Filled 2021-11-28: qty 1

## 2021-11-28 MED ORDER — ATORVASTATIN CALCIUM 10 MG PO TABS: 10.0000 mg | ORAL_TABLET | Freq: Every day | ORAL | Status: DC

## 2021-11-28 MED ORDER — ALBUTEROL SULFATE (2.5 MG/3ML) 0.083% IN NEBU: 3.0000 mL | INHALATION_SOLUTION | Freq: Four times a day (QID) | RESPIRATORY_TRACT | Status: DC | PRN

## 2021-11-28 MED ORDER — MELATONIN 5 MG PO TABS
5.0000 mg | ORAL_TABLET | Freq: Every day | ORAL | Status: DC
  Administered 2021-11-28: 5 mg via ORAL
  Filled 2021-11-28: qty 1

## 2021-11-28 MED ORDER — FERROUS SULFATE 325 (65 FE) MG PO TABS
325.0000 mg | ORAL_TABLET | Freq: Every day | ORAL | Status: DC
  Administered 2021-11-29: 325 mg via ORAL
  Filled 2021-11-28: qty 1

## 2021-11-28 MED ORDER — LACTATED RINGERS IV SOLN: INTRAVENOUS | Status: AC

## 2021-11-28 MED ORDER — ONDANSETRON HCL 4 MG PO TABS: 4.0000 mg | ORAL_TABLET | Freq: Four times a day (QID) | ORAL | Status: DC | PRN

## 2021-11-28 MED ORDER — INSULIN GLARGINE-YFGN 100 UNIT/ML ~~LOC~~ SOLN
24.0000 [IU] | Freq: Every day | SUBCUTANEOUS | Status: DC
  Administered 2021-11-29: 24 [IU] via SUBCUTANEOUS
  Filled 2021-11-28: qty 0.24

## 2021-11-28 MED ORDER — HYDRALAZINE HCL 10 MG PO TABS: 10.0000 mg | ORAL_TABLET | Freq: Four times a day (QID) | ORAL | Status: DC | PRN

## 2021-11-28 MED ORDER — PANTOPRAZOLE SODIUM 40 MG PO TBEC
80.0000 mg | DELAYED_RELEASE_TABLET | Freq: Every day | ORAL | Status: DC
  Administered 2021-11-29: 80 mg via ORAL
  Filled 2021-11-28: qty 2

## 2021-11-28 MED ORDER — LACTATED RINGERS IV BOLUS (SEPSIS): 2000.0000 mL | Freq: Once | INTRAVENOUS | Status: DC

## 2021-11-28 MED ORDER — OXYCODONE HCL 5 MG PO TABS: 5.0000 mg | ORAL_TABLET | Freq: Four times a day (QID) | ORAL | Status: DC | PRN

## 2021-11-28 MED ORDER — METRONIDAZOLE 500 MG/100ML IV SOLN: 500.0000 mg | Freq: Once | INTRAVENOUS | Status: DC

## 2021-11-28 MED ORDER — ACETAMINOPHEN 325 MG PO TABS: 650.0000 mg | ORAL_TABLET | Freq: Four times a day (QID) | ORAL | Status: DC | PRN

## 2021-11-28 MED ORDER — INSULIN ASPART 100 UNIT/ML IJ SOLN
0.0000 [IU] | Freq: Every day | INTRAMUSCULAR | Status: DC
  Administered 2021-11-28: 3 [IU] via SUBCUTANEOUS
  Filled 2021-11-28: qty 1

## 2021-11-28 MED ORDER — IOHEXOL 300 MG/ML  SOLN
60.0000 mL | Freq: Once | INTRAMUSCULAR | Status: AC | PRN
  Administered 2021-11-28: 60 mL via INTRAVENOUS

## 2021-11-28 MED ORDER — SODIUM CHLORIDE 0.9 % IV SOLN: 2.0000 g | Freq: Two times a day (BID) | INTRAVENOUS | Status: DC

## 2021-11-28 MED ORDER — SODIUM CHLORIDE 0.9 % IV SOLN
2.0000 g | Freq: Once | INTRAVENOUS | Status: AC
  Administered 2021-11-28: 2 g via INTRAVENOUS
  Filled 2021-11-28: qty 12.5

## 2021-11-28 MED ORDER — APIXABAN 5 MG PO TABS
5.0000 mg | ORAL_TABLET | Freq: Two times a day (BID) | ORAL | Status: DC
  Administered 2021-11-28 – 2021-11-29 (×2): 5 mg via ORAL
  Filled 2021-11-28 (×2): qty 1

## 2021-11-28 MED ORDER — TRAZODONE HCL 50 MG PO TABS: 50.0000 mg | ORAL_TABLET | Freq: Every evening | ORAL | Status: DC | PRN

## 2021-11-28 MED ORDER — SODIUM CHLORIDE 0.9 % IV SOLN
2.0000 g | INTRAVENOUS | Status: DC
  Administered 2021-11-29: 2 g via INTRAVENOUS
  Filled 2021-11-28: qty 20

## 2021-11-28 MED ORDER — INSULIN ASPART 100 UNIT/ML IJ SOLN
0.0000 [IU] | Freq: Three times a day (TID) | INTRAMUSCULAR | Status: DC
  Administered 2021-11-29 (×2): 15 [IU] via SUBCUTANEOUS
  Administered 2021-11-29: 11 [IU] via SUBCUTANEOUS
  Filled 2021-11-28 (×3): qty 1

## 2021-11-28 MED ORDER — INFLUENZA VAC A&B SA ADJ QUAD 0.5 ML IM PRSY
0.5000 mL | PREFILLED_SYRINGE | INTRAMUSCULAR | Status: DC
  Filled 2021-11-28: qty 0.5

## 2021-11-28 MED ORDER — VANCOMYCIN HCL 2000 MG/400ML IV SOLN
2000.0000 mg | Freq: Once | INTRAVENOUS | Status: DC
  Filled 2021-11-28: qty 400

## 2021-11-28 MED ORDER — LACTATED RINGERS IV BOLUS
500.0000 mL | Freq: Once | INTRAVENOUS | Status: AC
  Administered 2021-11-28: 500 mL via INTRAVENOUS

## 2021-11-28 MED ORDER — SENNOSIDES-DOCUSATE SODIUM 8.6-50 MG PO TABS: 1.0000 | ORAL_TABLET | Freq: Every evening | ORAL | Status: DC | PRN

## 2021-11-28 MED ORDER — MIDODRINE HCL 5 MG PO TABS: 10.0000 mg | ORAL_TABLET | ORAL | Status: DC | PRN

## 2021-11-28 MED ORDER — SODIUM CHLORIDE 0.9% FLUSH
3.0000 mL | Freq: Two times a day (BID) | INTRAVENOUS | Status: DC
  Administered 2021-11-29: 3 mL via INTRAVENOUS

## 2021-11-28 MED ORDER — ACETAMINOPHEN 650 MG RE SUPP: 650.0000 mg | Freq: Four times a day (QID) | RECTAL | Status: DC | PRN

## 2021-11-28 NOTE — Assessment & Plan Note (Signed)
-   Etiology work-up in progress, differentials include UTI - Patient is status post cefepime - UA was positive for leukocytes - CBC in the a.m.

## 2021-11-28 NOTE — ED Triage Notes (Signed)
Pt BIB EMS for near syncopal episode. Per EMS, pt was hypotensive at home with SBP 90s. Pt received 881ml of NS. Pt does wear 3L of home O2.    CBG 350 107/79 after fluids

## 2021-11-28 NOTE — ED Provider Notes (Signed)
[facility] Provider Note    Event Date/Time   First MD Initiated Contact with Patient 11/28/21 1510     (approximate)   History   Near Syncope   HPI  Karen [name] is a 76 y.o. female chronic kidney disease, diabetes, congestive heart failure with preserved EF, pulmonary hypertension, atrial fibrillation.  Reviewed discharge summary from July 5 of this year   Was at home on the couch watching television.  Suddenly report like the life was just taken out of her, she felt very faint.  She also reports she had a very mild headache occur with it.  Currently has just a very mild headache.  No chest pain no trouble breathing.  Been in her normal health up until this occurred suddenly.  No skipped beats or palpitations no abdominal pain.  Does have a history of infections in the past, but denies having felt as though she was having fevers chills or obvious signs of infection.  No pain or burning with urination  Patient reports she felt as though she was about to pass out for several minutes and started to get better around the time EMS started treatment  Currently feeling slightly better.  Initial blood pressure with EMS was hypotension systolic blood pressure in the 90s.  Received 850 mL of normal saline with EMS.  Physical Exam   Triage Vital Signs: ED Triage Vitals  Enc Vitals Group     BP 11/28/21 1304 (!) 91/53     Pulse Rate 11/28/21 1304 73     Resp 11/28/21 1307 (!) 21     Temp 11/28/21 1304 97.6 F (36.4 C)     Temp Source 11/28/21 1304 Oral     SpO2 11/28/21 1304 100 %     Weight 11/28/21 1305 200 lb (90.7 kg)     Height 11/28/21 1305 5\' 3"  (1.6 m)     Head Circumference --      Peak Flow --      Pain Score 11/28/21 1304 5     Pain Loc --      Pain Edu? --      Excl. in [place]? --     Most recent vital signs: Vitals:   11/28/21 1700 11/28/21 1739  BP:  134/68  Pulse:  76  Resp:  20  Temp: (!) 97.5 F (36.4 C)   SpO2:  100%      General: Awake, no distress.  She is very pleasant.  Calm well oriented.  Able to sit herself up mostly on her own with minimal assistance.  Moves all extremities well. CV:  Good peripheral perfusion.  Normal heart tones except for mild systolic murmur.  Normal rate.  Regular. Resp:  Normal effort.  Clear bilaterally.  Speaks without difficulty.  On 3 L nasal cannula patient reports this is normal for her she uses home oxygen. Abd:  No distention.  Soft nontender nondistended.  Reports she has not noticed any abdominal pain, reports she does feel just slightly uncomfortable when I palpate across and but in general but denies that it hurts.  No rebound or guarding.  No focality. Other:  Mild bilateral lower extremity edema about 2+ pitting.  Patient reports is normal for her.   ED Results / Procedures / Treatments   Labs (all labs ordered are listed, but only abnormal results are displayed) Labs Reviewed  BASIC METABOLIC PANEL - Abnormal; Notable for the following components:      Result Value  Sodium 130 (*)    Potassium 3.1 (*)    Chloride 85 (*)    Glucose, Bld 186 (*)    BUN 42 (*)    Creatinine, Ser 1.25 (*)    Calcium 8.8 (*)    GFR, Estimated 44 (*)    All other components within normal limits  CBC WITH DIFFERENTIAL/PLATELET - Abnormal; Notable for the following components:   WBC 13.6 (*)    Hemoglobin 11.9 (*)    RDW 16.4 (*)    Neutro Abs 11.2 (*)    Abs Immature Granulocytes 0.10 (*)    All other components within normal limits  LACTIC ACID, PLASMA - Abnormal; Notable for the following components:   Lactic Acid, Venous 3.5 (*)    All other components within normal limits  LACTIC ACID, PLASMA - Abnormal; Notable for the following components:   Lactic Acid, Venous 2.1 (*)    All other components within normal limits  HEPATIC FUNCTION PANEL - Abnormal; Notable for the following components:   Albumin 3.3 (*)    All other components within normal limits   URINALYSIS, ROUTINE W REFLEX MICROSCOPIC - Abnormal; Notable for the following components:   Color, Urine YELLOW (*)    APPearance CLOUDY (*)    Specific Gravity, Urine 1.003 (*)    Glucose, UA >=500 (*)    Hgb urine dipstick MODERATE (*)    Leukocytes,Ua LARGE (*)    WBC, UA >50 (*)    Bacteria, UA MANY (*)    All other components within normal limits  SARS CORONAVIRUS 2 BY RT PCR  CULTURE, BLOOD (ROUTINE X 2)  CULTURE, BLOOD (ROUTINE X 2)  URINE CULTURE  PROCALCITONIN  BRAIN NATRIURETIC PEPTIDE  MAGNESIUM  BASIC METABOLIC PANEL  CBC  VITAMIN B12  CBG MONITORING, ED  TROPONIN I (HIGH SENSITIVITY)  TROPONIN I (HIGH SENSITIVITY)     EKG  And interpreted by me at 1325 heart rate 80 QRS 80 QTc 490 Normal sinus rhythm, mild nonspecific T wave abnormality seen primarily in the anterolateral leads.  No STEMI.  Patient denies chest pain palpitations or difficulty breathing   RADIOLOGY Chest x-ray interpreted by me as negative for obvious acute process or infiltrate.  Perhaps very small pleural effusions  CT Head Wo Contrast  Result Date: 11/28/2021 CLINICAL DATA:  Provided history: Headache. Tension type headache with near syncopal episode. EXAM: CT HEAD WITHOUT CONTRAST TECHNIQUE: Contiguous axial images were obtained from the base of the skull through the vertex without intravenous contrast. RADIATION DOSE REDUCTION: This exam was performed according to the departmental dose-optimization program which includes automated exposure control, adjustment of the mA and/or kV according to patient size and/or use of iterative reconstruction technique. COMPARISON:  Prior head CT examinations 10/30/2020 and earlier. Brain MRI 09/29/2015. FINDINGS: Brain: Moderate cerebral and cerebellar atrophy. Minimal patchy and ill-defined hypoattenuation within the cerebral white matter, nonspecific but compatible with chronic small vessel ischemic disease. There is no acute intracranial hemorrhage. No  demarcated cortical infarct. No extra-axial fluid collection. No evidence of an intracranial mass. No midline shift. Vascular: No hyperdense vessel. Atherosclerotic calcifications. Skull: No fracture or aggressive osseous lesion. Sinuses/Orbits: No mass or acute finding within the imaged orbits. No significant paranasal sinus disease at the imaged levels. Other: Trace fluid within left mastoid air cells. IMPRESSION: 1. No evidence of acute intracranial abnormality. 2. Minimal chronic small-vessel ischemic changes within the cerebral white matter. 3. Moderate cerebral and cerebellar atrophy. 4. Trace left mastoid effusion. Electronically Signed  By: Kellie Simmering D.O.   On: 11/28/2021 15:55   DG Chest 2 View  Result Date: 11/28/2021 CLINICAL DATA:  Weakness.  Syncope. EXAM: CHEST - 2 VIEW COMPARISON:  August 30, 2021 FINDINGS: Hyperinflation of the lungs. Probable tiny pleural effusions based on the lateral view. Mild atelectasis in the left base. The heart size remains mildly enlarged. The hila and mediastinum are unremarkable. No pulmonary nodules or masses. No suspicious infiltrates. IMPRESSION: Hyperinflation of the lungs suggesting the possibility of COPD or emphysema. Recommend clinical correlation. Probable tiny pleural effusions. No other significant abnormalities. Electronically Signed   By: Dorise Bullion III M.D.   On: 11/28/2021 13:40       PROCEDURES:  Critical Care performed: CRITICAL CARE Performed by: Delman Kitten   Total critical care time: 35 minutes  Critical care time was exclusive of separately billable procedures and treating other patients.  Critical care was necessary to treat or prevent imminent or life-threatening deterioration.  Critical care was time spent personally by me on the following activities: development of treatment plan with patient and/or surrogate as well as nursing, discussions with consultants, evaluation of patient's response to treatment, examination of  patient, obtaining history from patient or surrogate, ordering and performing treatments and interventions, ordering and review of laboratory studies, ordering and review of radiographic studies, pulse oximetry and re-evaluation of patient's condition.  Code sepsis initiated  Procedures   MEDICATIONS ORDERED IN ED: Medications  lactated ringers infusion ( Intravenous New Bag/Given 11/28/21 1558)  sodium chloride flush (NS) 0.9 % injection 3 mL (has no administration in time range)  acetaminophen (TYLENOL) tablet 650 mg (has no administration in time range)    Or  acetaminophen (TYLENOL) suppository 650 mg (has no administration in time range)  senna-docusate (Senokot-S) tablet 1 tablet (has no administration in time range)  ondansetron (ZOFRAN) tablet 4 mg (has no administration in time range)    Or  ondansetron (ZOFRAN) injection 4 mg (has no administration in time range)  midodrine (PROAMATINE) tablet 10 mg (has no administration in time range)  citric acid-potassium citrate (POLYCITRA) 10 mEq/5 ml solution 10 mEq (has no administration in time range)  ceFEPIme (MAXIPIME) 2 g in sodium chloride 0.9 % 100 mL IVPB (has no administration in time range)  oxyCODONE (Oxy IR/ROXICODONE) immediate release tablet 5 mg (has no administration in time range)  atorvastatin (LIPITOR) tablet 10 mg (has no administration in time range)  torsemide (DEMADEX) tablet 20 mg (has no administration in time range)  traZODone (DESYREL) tablet 50 mg (has no administration in time range)  insulin glargine-yfgn (SEMGLEE) injection 24 Units (has no administration in time range)  pantoprazole (PROTONIX) EC tablet 80 mg (has no administration in time range)  apixaban (ELIQUIS) tablet 5 mg (has no administration in time range)  Iron TABS 325 mg (has no administration in time range)  melatonin tablet 5 mg (has no administration in time range)  albuterol (VENTOLIN HFA) 108 (90 Base) MCG/ACT inhaler 2 puff (has no  administration in time range)  montelukast (SINGULAIR) tablet 10 mg (has no administration in time range)  cefTRIAXone (ROCEPHIN) 2 g in sodium chloride 0.9 % 100 mL IVPB (has no administration in time range)  vancomycin (VANCOREADY) IVPB 2000 mg/400 mL (has no administration in time range)  ceFEPIme (MAXIPIME) 2 g in sodium chloride 0.9 % 100 mL IVPB (0 g Intravenous Stopped 11/28/21 1638)  lactated ringers bolus 500 mL (0 mLs Intravenous Stopped 11/28/21 1654)     IMPRESSION /  MDM / ASSESSMENT AND PLAN / ED COURSE  I reviewed the triage vital signs and the nursing notes.                              Differential diagnosis includes, but is not limited to, possible near syncopal episode, dehydration, AKI, syncope, arrhythmia, exacerbation of pulmonary hypertension, infection, sepsis, anemia, metabolic abnormality etc.  Differential diagnosis quite broad  My previous partner had ordered a broad antibiotic selection, however after my discussion with the patient she does not have any obvious infectious symptoms.  We will await further testing before decision to use extreme broad-spectrum, at this point though we will continue with cefepime which she had previously ordered as the patient does have mild left costovertebral angle tenderness and history of previous presentations with sepsis  ----------------------------------------- 3:23 PM on 11/28/2021 ----------------------------------------- Further work-up pending, but this point patient does not meet evidence of severe sepsis.  She has had 2 episodes of hypotension but none with systolic blood pressure less than 90 and her lactate is not greater than 4.  Patient's presentation is most consistent with acute presentation with potential threat to life or bodily function.  The patient is on the cardiac monitor to evaluate for evidence of arrhythmia and/or significant heart rate changes.  ----------------------------------------- 6:43 PM on  11/28/2021 ----------------------------------------- Given the patient's presentation with hypotension near syncope and notable risk factors for high risk syncope  ----------------------------------------- 7:57 PM on 11/28/2021 ----------------------------------------- Urinalysis reviewed consistent with urinary tract infection.  I did follow-up with Dr. Tobie Poet to notify her as well.  Patient receiving antibiotic including cefepime     FINAL CLINICAL IMPRESSION(S) / ED DIAGNOSES   Final diagnoses:  Near syncope  Hypotension, unspecified hypotension type  Urinary tract infection, acute Sepsis   Rx / DC Orders   ED Discharge Orders     None        Note:  This document was prepared using Dragon voice recognition software and may include unintentional dictation errors.   Delman Kitten, MD 11/28/21 1958

## 2021-11-28 NOTE — Assessment & Plan Note (Signed)
-   Magnesium on admission was within normal limits - Polycitra 10 mill equivalent, p.o., 3 times daily, 4 doses ordered - BMP in a.m.

## 2021-11-28 NOTE — Assessment & Plan Note (Signed)
-   Present on admission - Ceftriaxone 2 g IV daily, ordered for additional doses starting on 11/29/2021 to complete 5-day course - Urine culture added

## 2021-11-28 NOTE — Consult Note (Signed)
CODE SEPSIS - PHARMACY COMMUNICATION  **Broad Spectrum Antibiotics should be administered within 1 hour of Sepsis diagnosis**  Time Code Sepsis Called/Page Received: 1439  Antibiotics Ordered: cefepime  Time of 1st antibiotic administration: 1544  Additional action taken by pharmacy: Reached out to nurse making them aware of code sepsis.   If necessary, Name of Provider/Nurse Contacted: Newman Pies    Pearla Dubonnet ,PharmD Clinical Pharmacist  11/28/2021  3:38 PM

## 2021-11-28 NOTE — Assessment & Plan Note (Signed)
At baseline 

## 2021-11-28 NOTE — Assessment & Plan Note (Signed)
-   This meets criteria for morbid obesity based on the presence of 1 or more chronic comorbidities. Patient has hyperlipidemia and diabetes with a BMI of 35.4. This complicates overall care and prognosis.

## 2021-11-28 NOTE — Assessment & Plan Note (Signed)
-   Patient had leukocytosis of 13.6, elevated lactic acid, UA was positive for leukocytes - Urine culture added - Blood cultures x2 ordered - Continue ceftriaxone 2 g IV daily - Admit to telemetry cardiac, observation

## 2021-11-28 NOTE — Progress Notes (Signed)
Elink following code sepsis °

## 2021-11-28 NOTE — Consult Note (Signed)
PHARMACY -  BRIEF ANTIBIOTIC NOTE   Pharmacy has received consult(s) for Vancomycin and Cefepime from an ED provider.  The patient's profile has been reviewed for ht/wt/allergies/indication/available labs.    One time order(s) placed for Vancomycin 1g IV and Cefepime 2g IV x 1 dose each.  Further antibiotics/pharmacy consults should be ordered by admitting physician if indicated.                       Thank you, Pearla Dubonnet 11/28/2021  2:51 PM

## 2021-11-28 NOTE — Consult Note (Signed)
Pharmacy Antibiotic Note  Karen [name] is a 76 y.o. female admitted on 11/28/2021 with sepsis.  Pharmacy has been consulted for Vancomycin and Cefepime dosing.  Plan: Cefepime 2gm IVPB q 12 hours Vancomycin 2000mg  IV loading dose. Will follow up in AM for further vancomycin dosing.  Height: 5\' 3"  (160 cm) Weight: 90.7 kg (200 lb) IBW/kg (Calculated) : 52.4  Temp (24hrs), Avg:97.6 F (36.4 C), Min:97.5 F (36.4 C), Max:97.6 F (36.4 C)  Recent Labs  Lab 11/28/21 1315 11/28/21 1633  WBC 13.6*  --   CREATININE 1.25*  --   LATICACIDVEN 3.5* 2.1*    Estimated Creatinine Clearance: 40.3 mL/min (A) (by C-G formula based on SCr of 1.25 mg/dL (H)).    Allergies  Allergen Reactions   Ace Inhibitors Hives   Beta Adrenergic Blockers     Junctional bradycardia   Gabapentin Hives   Lisinopril Hives   Lyrica [Pregabalin] Hives   Shrimp [Shellfish Allergy] Swelling    Swelling of the lips    Antimicrobials this admission: Cefepime 9/25 >>  Vancomycin 9/25 >>    Thank you for allowing pharmacy to be a part of this patient's care.  Gid Schoffstall Rodriguez-Guzman PharmD, BCPS 11/28/2021 7:53 PM

## 2021-11-28 NOTE — Assessment & Plan Note (Signed)
-   Torsemide 20 mg p.o. twice daily resumed - Hydralazine 10 mg p.o. every 6 hours as needed for SBP greater than 180, 4 days

## 2021-11-28 NOTE — ED Notes (Signed)
PT FOUND TO HAVE PULLED IV OUT AND BLEEDING. BANDAGE APPLIED AND FLUIDS PAUSED. PT EATING AT TIME AND WHILE WRITER IN ROOM, PT COUGHING. PT HAVING DIFFICULTY WITH EATING AND STOPPED BY WRITER. PT CONTINUING TO COUGH AND ABLE TO COUGH UP FOOD BOLUS. UNKNOWN ASPIRATION. LUNG SOUNDS CLEAR ON ASSESSMENT BY WRITER. ADMITTING DO, [facility] MADE AWARE AND X-RAY ORDERED. ALL PO TAKEN FROM PT AT THIS TIME AND PT SITTING STRAIGHT UP. AIRWAY CLEAR AND PT HAS STOPPED COUGHING AND ABLE TO TALK WITH CLEAR SPEECH.

## 2021-11-28 NOTE — H&P (Addendum)
History and Physical   DORE OQUIN AYT:[id number] DOB: [birth year] DOA: 11/28/2021  PCP: Margarita Rana, MD  Outpatient Specialists: [facility] cardiology Patient coming from: Home  I have personally briefly reviewed patient's old medical records in [place].  Chief Concern: Near syncope  HPI: Mr. Karen [name] is a 76 year old female with history of pulmonary hypertension, insulin-dependent diabetes mellitus, permanent atrial fibrillation, hyperlipidemia, CKD 3B, on chronic 3 L nasal cannula, OSA, who presents emergency department for chief concerns of syncopal episode and hypotension.  Per ED note, patient received 850 mL of sodium chloride by EMS.  Initial vitals in the emergency department showed temperature of 97.6, respiration rate of 21, heart rate of 73, blood pressure 91/53, SPO2 of 100% on 3 L nasal cannula  Patient's blood pressures currently improved at 134/68.  Serum sodium is 130, potassium 3.1, chloride 85, bicarb 31, BUN of 42, serum creatinine 1.25, GFR 44, nonfasting blood glucose 186, WBC 13.6, hemoglobin 11.9, platelets of 276.  Lactic acid is 2.5 and decreased to 2.1.  BNP is 29.4.  High sensitive troponin was 7 and on repeat was 8. Procalcitonin < 0.10.  ED treatment: Cefepime 2 g IV, LR 2 L bolus, LR 500 mL bolus, LR 150 mL/h. --------------------------------- At bedside, she is able to tell me her name, age, current location of hospital, current calendar year.   She reports she was feeling sudden weakness when sitting watching TV. EMS was called. She was told her BP was low.   She reports she no long has shortness of breath. She denies chest pain, dysuria. She denies dysuria and endorses increase frequency/urgency.   Social history: She lives at home with her husband. She denies tobacco use. She quit smoking 20+ years ago. She denies etoh and recreational drug use. She is retired and formerly Regulatory affairs officer.   ROS: Constitutional: no weight change, no  fever ENT/Mouth: no sore throat, no rhinorrhea Eyes: no eye pain, no vision changes Cardiovascular: no chest pain, no dyspnea,  no edema, no palpitations Respiratory: no cough, no sputum, no wheezing Gastrointestinal: no nausea, no vomiting, no diarrhea, no constipation Genitourinary: no urinary incontinence, no dysuria, no hematuria Musculoskeletal: no arthralgias, no myalgias Skin: no skin lesions, no pruritus, Neuro: + weakness, no loss of consciousness, no syncope Psych: no anxiety, no depression, + decrease appetite Heme/Lymph: no bruising, no bleeding  ED Course: Discussed with emergency medicine provider, patient requiring hospitalization for chief concerns of near syncope along with meeting sepsis criteria.  Assessment/Plan  Principal Problem:   Near syncope Active Problems:   Sepsis secondary to UTI (HCC)   Severe pulmonary hypertension (HCC)   OSA (obstructive sleep apnea)   Permanent atrial fibrillation (HCC)   Leucocytosis   Hypokalemia   Morbid obesity (HCC)   Weakness   Hypertension   Obesity (BMI 35.0-39.9 without comorbidity)   Sepsis (HCC)   Stage 3b chronic kidney disease (CKD) (HCC)   Coughing   Assessment and Plan:  * Near syncope - Presumptive diagnosis secondary to dehydration, hypotension - Patient responded well to aggressive fluid hydration by EMS and EDP - We will continue with LR 150 mL/h, 20 hours ordered by EDP - Admit to telemetry cardiac, observation  Sepsis secondary to UTI [facility]) - Present on admission - Ceftriaxone 2 g IV daily, ordered for additional doses starting on 11/29/2021 to complete 5-day course - Urine culture added  Severe pulmonary hypertension (HCC) - CPAP nightly ordered  OSA (obstructive sleep apnea) - CPAP nightly ordered  Permanent atrial fibrillation (HCC) - Eliquis 5 mg p.o. twice daily ordered  Hypokalemia - Magnesium on admission was within normal limits - Polycitra 10 mill equivalent, p.o., 3 times daily,  4 doses ordered - BMP in a.m.  Leucocytosis - Etiology work-up in progress, differentials include UTI - Patient is status post cefepime - UA was positive for leukocytes - CBC in the a.m.  Morbid obesity ([place]) - This meets criteria for morbid obesity based on the presence of 1 or more chronic comorbidities. Patient has hypertension, insulin-dependent diabetes mellitus with a BMI of 35.4. This complicates overall care and prognosis.   Coughing - Patient was having dinner and had a coughing fit - Nursing came in and found that her IV was pulled out and there was blood in her rice and patient kept eating her food - Patient states her throat was very good - Aspiration precaution ordered, n.p.o. except for sips with meds - Portable chest x-ray ordered to assess for aspiration pneumonia - Procalcitonin on admission was negative - I continued ordering procalcitonin every morning to ensure patient does not develop aspiration pneumonia - SLP ordered  Stage 3b chronic kidney disease (CKD) ([place]) - At baseline  Sepsis ([place]) - Patient had leukocytosis of 13.6, elevated lactic acid, UA was positive for leukocytes - Urine culture added - Blood cultures x2 ordered - Continue ceftriaxone 2 g IV daily - Admit to telemetry cardiac, observation  Obesity (BMI 35.0-39.9 without comorbidity) - This meets criteria for morbid obesity based on the presence of 1 or more chronic comorbidities. Patient has hyperlipidemia and diabetes with a BMI of 35.4. This complicates overall care and prognosis.   Hypertension - Torsemide 20 mg p.o. twice daily resumed - Hydralazine 10 mg p.o. every 6 hours as needed for SBP greater than 180, 4 days  Weakness - Check B12  AM team to complete med reconciliation  Chart reviewed.   DVT prophylaxis: Apixaban Code Status: DNR, confirmed with patient at bedside Diet: Heart healthy/carb modified Family Communication: No Disposition Plan: Pending clinical  course Consults called: Pharmacy Admission status: Telemetry cardiac, observation  Past Medical History:  Diagnosis Date   (HFpEF) heart failure with preserved ejection fraction ([place])    a. 2017 Echo: EF 50%; b. 06/2018 Echo: EF 50-55%; c. 08/2018 Echo: EF 50-55%, Nl RV fxn; d. 09/2019 Echo: EF 55-60%, no rwma, mild LVH, Gr1 DD, nl RV size/fxn, PASP 63.22mmHg. Mildly dil LA. Triv MR. Mod AS (AoV 0.94cm^2 VTI; mean grad 17.13mmHg); d. 02/2021 Echo: EF 60-65%, no rwma, GrI DD, mildly red RV fxn, RVSP 64.107mmHg, mild-mod MR, mod-sev TR, mild AS.   Acute on chronic respiratory failure with hypoxia and hypercapnia (HCC) 01/07/2015   Anemia    Asterixis 01/07/2015   Asthma    Cataract    CKD (chronic kidney disease), stage III (HCC)    COPD (chronic obstructive pulmonary disease) ([place])    a. 06/2018 tobacco use, home 3L oxygen    Diabetes mellitus without complication ([place])    a. 09/2019 A1C 8.8   Edema, peripheral 04/20/2014   GI bleed 06/28/2019   History of kidney stones    Hyperlipidemia    Hypertension    Iron deficiency anemia 06/22/2014   Junctional bradycardia    a. In setting of beta blocker therapy.   Leucocytosis 10/19/2015   Moderate aortic stenosis    a.  09/2019 Echo: Mod AS (AoV 0.94cm^2 VTI; mean grad 17.23mmHg); b. 02/2021 Echo: Mild AS.   Morbid obesity ([place])  Overactive bladder    Primary osteoarthritis of right knee 09/01/2016   Sciatica 01/07/2015   Valvular heart disease    a. 02/2021 Echo: EF 60-65%, no rwma, GrI DD, mild-mod MR, mod-sev TR, mild AS   Past Surgical History:  Procedure Laterality Date   APPENDECTOMY     CESAREAN SECTION     x3   CHOLECYSTECTOMY     COLONOSCOPY WITH PROPOFOL N/A 08/28/2017   Procedure: COLONOSCOPY WITH PROPOFOL;  Surgeon: Lucilla Lame, MD;  Location: [facility] ENDOSCOPY;  Service: Endoscopy;  Laterality: N/A;   COLONOSCOPY WITH PROPOFOL N/A 08/29/2017   Procedure: COLONOSCOPY WITH PROPOFOL;  Surgeon: Lucilla Lame, MD;  Location: [facility]  ENDOSCOPY;  Service: Endoscopy;  Laterality: N/A;   CYSTOSCOPY W/ URETERAL STENT PLACEMENT Right 09/15/2017   Procedure: CYSTOSCOPY WITH RETROGRADE PYELOGRAM/URETERAL STENT PLACEMENT;  Surgeon: Cleon Gustin, MD;  Location: [facility] ORS;  Service: Urology;  Laterality: Right;   CYSTOSCOPY/URETEROSCOPY/HOLMIUM LASER/STENT PLACEMENT Right 10/09/2017   Procedure: CYSTOSCOPY/URETEROSCOPY/HOLMIUM LASER/STENT PLACEMENT;  Surgeon: Abbie Sons, MD;  Location: [facility] ORS;  Service: Urology;  Laterality: Right;  right Stent exchange   ESOPHAGOGASTRODUODENOSCOPY (EGD) WITH PROPOFOL N/A 09/16/2018   Procedure: ESOPHAGOGASTRODUODENOSCOPY (EGD) WITH PROPOFOL;  Surgeon: Lin Landsman, MD;  Location: [facility];  Service: Gastroenterology;  Laterality: N/A;   EYE SURGERY     IR THORACENTESIS ASP PLEURAL SPACE W/IMG GUIDE  02/07/2021   Social History:  reports that she quit smoking about 29 years ago. Her smoking use included cigarettes. She has a 20.00 pack-year smoking history. She has never used smokeless tobacco. She reports that she does not drink alcohol and does not use drugs.  Allergies  Allergen Reactions   Ace Inhibitors Hives   Beta Adrenergic Blockers     Junctional bradycardia   Gabapentin Hives   Lisinopril Hives   Lyrica [Pregabalin] Hives   Shrimp [Shellfish Allergy] Swelling    Swelling of the lips   Family History  Problem Relation Age of Onset   Other Mother        unknown medical history   Other Father        unknown medical history   Family history: Family history reviewed and not pertinent  Prior to Admission medications   Medication Sig Start Date End Date Taking? Authorizing Provider  acetaminophen (TYLENOL) 500 MG tablet Take 1-2 tablets (500-1,000 mg total) by mouth every 6 (six) hours as needed for mild pain, fever, moderate pain or headache. Do not take more than 4 grams a day 02/05/21   Mercy Riding, MD  albuterol (VENTOLIN HFA) 108 (90 Base) MCG/ACT inhaler  Inhale 2 puffs into the lungs every 6 (six) hours as needed for wheezing or shortness of breath.     [provider]  allopurinol (ZYLOPRIM) 100 MG tablet Take 1 tablet by mouth daily.    [provider]  apixaban (ELIQUIS) 5 MG TABS tablet Take 1 tablet by mouth twice daily 04/06/21   [name], MD  atorvastatin (LIPITOR) 10 MG tablet Take 1 tablet by mouth daily. 12/17/20   [provider]  empagliflozin (JARDIANCE) 10 MG TABS tablet Take 1 tablet (10 mg total) by mouth daily. 08/09/21   Annita Brod, MD  esomeprazole (NEXIUM) 40 MG capsule Take 40 mg by mouth daily. 12/17/18   [provider]  Ferrous Sulfate (IRON) 325 (65 Fe) MG TABS Take 1 tablet (325 mg total) by mouth daily. 02/12/21   Loletha Grayer, MD  fluticasone (FLONASE) 50 MCG/ACT  nasal spray Place 2 sprays into both nostrils daily. 11/15/18   [provider]  guaiFENesin (MUCINEX) 600 MG 12 hr tablet Take 600 mg by mouth daily.    [provider]  insulin glargine (LANTUS) 100 UNIT/ML injection Inject 24 Units into the skin 2 (two) times daily with a meal.    [provider]  insulin lispro (HUMALOG) 100 UNIT/ML KwikPen Inject 12 Units into the skin in the morning, at noon, and at bedtime. 10 units with meals plus additional units for correction Patient taking differently: Inject 10 Units into the skin in the morning, at noon, and at bedtime. 10 units with meals plus additional units for correction 09/07/21   Annita Brod, MD  insulin regular (NOVOLIN R) 100 units/mL injection Inject 2-14 Units into the skin 3 (three) times daily before meals. Blood Sugar 201-250.. Give 2 units Blood Sugar 250-300. Give 4 units  Blood Sugar 301-350. Give 6 units Blood Sugar 351-400.  Give 8 units Blood Sugar 401-450 . Give 10 units Blood Sugar 451-500 Give 12 units Blood Sugar <500 Give 14 units    [provider]  levalbuterol (XOPENEX) 0.63 MG/3ML nebulizer  solution Take 3 mLs (0.63 mg total) by nebulization every 8 (eight) hours as needed for wheezing or shortness of breath. 09/07/21   Annita Brod, MD  lidocaine (LIDODERM) 5 % Place 2 patches onto the skin daily. Remove & Discard patch within 12 hours or as directed by MD 09/07/21   Annita Brod, MD  melatonin 5 MG TABS Take 5 mg by mouth at bedtime. 11/10/20   [provider]  montelukast (SINGULAIR) 10 MG tablet Take 10 mg by mouth at bedtime.    [provider]  oxyCODONE (OXY IR/ROXICODONE) 5 MG immediate release tablet Take 1 tablet (5 mg total) by mouth every 6 (six) hours as needed for moderate pain. 09/07/21   Annita Brod, MD  senna-docusate (SENOKOT-S) 8.6-50 MG tablet Take 1 tablet by mouth 2 (two) times daily between meals as needed for mild constipation. 02/05/21   Mercy Riding, MD  spironolactone (ALDACTONE) 25 MG tablet Take 1 tablet (25 mg total) by mouth daily. 09/07/21   Annita Brod, MD  torsemide (DEMADEX) 20 MG tablet Take 20 mg by mouth 2 (two) times daily.    [provider]  traZODone (DESYREL) 50 MG tablet Take 1 tablet (50 mg total) by mouth at bedtime as needed for sleep. 02/12/21   Loletha Grayer, MD  vitamin B-12 (CYANOCOBALAMIN) 500 MCG tablet Take 500 mcg by mouth daily.    [provider]  Vitamin D, Cholecalciferol, 25 MCG (1000 UT) CAPS Take 1 tablet by mouth daily.    [provider]    Physical Exam: Vitals:   11/28/21 1530 11/28/21 1630 11/28/21 1700 11/28/21 1739  BP: (!) 104/50 118/60  134/68  Pulse: 74 77  76  Resp: 13 (!) 22  20  Temp:   (!) 97.5 F (36.4 C)   TempSrc:   Oral   SpO2: 98% 99%  100%  Weight:      Height:       Constitutional: appears age-appropriate, frail, NAD, calm, comfortable Eyes: PERRL, lids and conjunctivae normal ENMT: Mucous membranes are moist. Posterior pharynx clear of any exudate or lesions. Age-appropriate dentition. Hearing appropriate Neck: normal, supple,  no masses, no thyromegaly Respiratory: clear to auscultation bilaterally, no wheezing, no crackles. Normal respiratory effort. No accessory muscle use.  Cardiovascular: Regular rate and  rhythm, no murmurs / rubs / gallops. No extremity edema. 2+ pedal pulses. No carotid bruits.  Abdomen: Obese abdomen with pannus, no tenderness, no masses palpated, no hepatosplenomegaly. Bowel sounds positive.  Musculoskeletal: no clubbing / cyanosis. No joint deformity upper and lower extremities. Good ROM, no contractures, no atrophy. Normal muscle tone.  Skin: no rashes, lesions, ulcers. No induration Neurologic: Sensation intact. Strength 5/5 in all 4.  Psychiatric: Normal judgment and insight. Alert and oriented x 3. Normal mood.   EKG: independently reviewed, showing sinus rhythm with rate of 79, QTc 490  Chest x-ray on Admission: I personally reviewed and I agree with radiologist reading as below.  CT SOFT TISSUE NECK W CONTRAST  Result Date: 11/28/2021 CLINICAL DATA:  Pre syncopal episode mild headache; hypotension EXAM: CT NECK WITH CONTRAST TECHNIQUE: Multidetector CT imaging of the neck was performed using the standard protocol following the bolus administration of intravenous contrast. RADIATION DOSE REDUCTION: This exam was performed according to the departmental dose-optimization program which includes automated exposure control, adjustment of the mA and/or kV according to patient size and/or use of iterative reconstruction technique. CONTRAST:  27mL OMNIPAQUE IOHEXOL 300 MG/ML  SOLN COMPARISON:  None Available. FINDINGS: Pharynx and larynx: Normal. No mass or swelling. Salivary glands: No inflammation, mass, or stone. Thyroid: Normal. Lymph nodes: None enlarged or abnormal density. Vascular: Patent. Aortic and carotid atherosclerotic calcifications. Limited intracranial: Negative. Visualized orbits: No acute finding. Status post bilateral lens replacements. Mastoids and visualized paranasal sinuses:  Clear. Skeleton: No acute osseous abnormality.  Poor dentition. Upper chest: No focal pulmonary opacity or pleural effusion. Small calcified nodule along the left fissure, most likely sequela of prior granulomatous disease. Other: None. IMPRESSION: No acute finding in the neck. Electronically Signed   By: Merilyn Baba M.D.   On: 11/28/2021 21:01   DG Chest Port 1 View  Result Date: 11/28/2021 CLINICAL DATA:  Presyncope, hypotension EXAM: PORTABLE CHEST 1 VIEW COMPARISON:  11/28/2021 FINDINGS: The lungs are mildly hyperinflated in keeping with changes of underlying COPD, unchanged from prior examination. The lungs are clear. Tiny bilateral pleural effusions. No pneumothorax. Cardiac size within normal limits. Pulmonary vascularity is normal. No acute bone abnormality. IMPRESSION: 1. Tiny bilateral pleural effusions. 2. COPD. Electronically Signed   By: Fidela Salisbury M.D.   On: 11/28/2021 20:21   CT Head Wo Contrast  Result Date: 11/28/2021 CLINICAL DATA:  Provided history: Headache. Tension type headache with near syncopal episode. EXAM: CT HEAD WITHOUT CONTRAST TECHNIQUE: Contiguous axial images were obtained from the base of the skull through the vertex without intravenous contrast. RADIATION DOSE REDUCTION: This exam was performed according to the departmental dose-optimization program which includes automated exposure control, adjustment of the mA and/or kV according to patient size and/or use of iterative reconstruction technique. COMPARISON:  Prior head CT examinations 10/30/2020 and earlier. Brain MRI 09/29/2015. FINDINGS: Brain: Moderate cerebral and cerebellar atrophy. Minimal patchy and ill-defined hypoattenuation within the cerebral white matter, nonspecific but compatible with chronic small vessel ischemic disease. There is no acute intracranial hemorrhage. No demarcated cortical infarct. No extra-axial fluid collection. No evidence of an intracranial mass. No midline shift. Vascular: No  hyperdense vessel. Atherosclerotic calcifications. Skull: No fracture or aggressive osseous lesion. Sinuses/Orbits: No mass or acute finding within the imaged orbits. No significant paranasal sinus disease at the imaged levels. Other: Trace fluid within left mastoid air cells. IMPRESSION: 1. No evidence of acute intracranial abnormality. 2. Minimal chronic small-vessel ischemic changes within the cerebral white matter. 3. Moderate  cerebral and cerebellar atrophy. 4. Trace left mastoid effusion. Electronically Signed   By: Kellie Simmering D.O.   On: 11/28/2021 15:55   DG Chest 2 View  Result Date: 11/28/2021 CLINICAL DATA:  Weakness.  Syncope. EXAM: CHEST - 2 VIEW COMPARISON:  August 30, 2021 FINDINGS: Hyperinflation of the lungs. Probable tiny pleural effusions based on the lateral view. Mild atelectasis in the left base. The heart size remains mildly enlarged. The hila and mediastinum are unremarkable. No pulmonary nodules or masses. No suspicious infiltrates. IMPRESSION: Hyperinflation of the lungs suggesting the possibility of COPD or emphysema. Recommend clinical correlation. Probable tiny pleural effusions. No other significant abnormalities. Electronically Signed   By: Dorise Bullion III M.D.   On: 11/28/2021 13:40    Labs on Admission: I have personally reviewed following labs  CBC: Recent Labs  Lab 11/28/21 1315  WBC 13.6*  NEUTROABS 11.2*  HGB 11.9*  HCT 36.9  MCV 85.4  PLT 774   Basic Metabolic Panel: Recent Labs  Lab 11/28/21 1315 11/28/21 1633  NA 130*  --   K 3.1*  --   CL 85*  --   CO2 31  --   GLUCOSE 186*  --   BUN 42*  --   CREATININE 1.25*  --   CALCIUM 8.8*  --   MG  --  2.0   GFR: Estimated Creatinine Clearance: 40.3 mL/min (A) (by C-G formula based on SCr of 1.25 mg/dL (H)).  Liver Function Tests: Recent Labs  Lab 11/28/21 1633  AST 17  ALT 9  ALKPHOS 69  BILITOT 0.7  PROT 6.5  ALBUMIN 3.3*   Urine analysis:    Component Value Date/Time   COLORURINE  YELLOW (A) 11/28/2021 1739   APPEARANCEUR CLOUDY (A) 11/28/2021 1739   APPEARANCEUR Cloudy (A) 09/28/2017 1338   LABSPEC 1.003 (L) 11/28/2021 1739   LABSPEC 1.024 04/02/2014 0517   PHURINE 6.0 11/28/2021 1739   GLUCOSEU >=500 (A) 11/28/2021 1739   GLUCOSEU >=500 04/02/2014 0517   HGBUR MODERATE (A) 11/28/2021 1739   BILIRUBINUR NEGATIVE 11/28/2021 1739   BILIRUBINUR Negative 09/28/2017 1338   BILIRUBINUR Negative 04/02/2014 0517   KETONESUR NEGATIVE 11/28/2021 1739   PROTEINUR NEGATIVE 11/28/2021 1739   NITRITE NEGATIVE 11/28/2021 1739   LEUKOCYTESUR LARGE (A) 11/28/2021 1739   LEUKOCYTESUR Trace 04/02/2014 0517   Dr. Tobie Poet [facility]  If 7PM-7AM, please contact overnight-coverage provider If 7AM-7PM, please contact day coverage provider [email or web address]  11/28/2021, 9:20 PM

## 2021-11-28 NOTE — Assessment & Plan Note (Addendum)
-   Patient was having dinner and had a coughing fit - Nursing came in and found that her IV was pulled out and there was blood in her rice and patient kept eating her food - Patient states her throat was very good - Aspiration precaution ordered, n.p.o. except for sips with meds - Portable chest x-ray ordered to assess for aspiration pneumonia - Procalcitonin on admission was negative - I continued ordering procalcitonin every morning to ensure patient does not develop aspiration pneumonia - SLP ordered

## 2021-11-28 NOTE — Assessment & Plan Note (Signed)
Check B12 

## 2021-11-28 NOTE — Assessment & Plan Note (Signed)
-   Presumptive diagnosis secondary to dehydration, hypotension - Patient responded well to aggressive fluid hydration by EMS and EDP - We will continue with LR 150 mL/h, 20 hours ordered by EDP - Admit to telemetry cardiac, observation

## 2021-11-28 NOTE — Assessment & Plan Note (Signed)
-   This meets criteria for morbid obesity based on the presence of 1 or more chronic comorbidities. Patient has hypertension, insulin-dependent diabetes mellitus with a BMI of 35.4. This complicates overall care and prognosis.

## 2021-11-28 NOTE — Progress Notes (Signed)
Notified bedside nurse of need to draw repeat lactic acid. 

## 2021-11-28 NOTE — Assessment & Plan Note (Signed)
-   CPAP nightly ordered 

## 2021-11-28 NOTE — Hospital Course (Addendum)
Mr. Karen [name] is a 76 year old female with history of pulmonary hypertension, insulin-dependent diabetes mellitus, permanent atrial fibrillation, hyperlipidemia, CKD 3B, on chronic 3 L nasal cannula, OSA, who presents emergency department for chief concerns of syncopal episode and hypotension.  Per ED note, patient received 850 mL of sodium chloride by EMS.  Initial vitals in the emergency department showed temperature of 97.6, respiration rate of 21, heart rate of 73, blood pressure 91/53, SPO2 of 100% on 3 L nasal cannula  Patient's blood pressures currently improved at 134/68.  Serum sodium is 130, potassium 3.1, chloride 85, bicarb 31, BUN of 42, serum creatinine 1.25, GFR 44, nonfasting blood glucose 186, WBC 13.6, hemoglobin 11.9, platelets of 276.  Lactic acid is 2.5 and decreased to 2.1.  BNP is 29.4.  High sensitive troponin was 7 and on repeat was 8. Procalcitonin < 0.10.  ED treatment: Cefepime 2 g IV, LR 2 L bolus, LR 500 mL bolus, LR 150 mL/h.  9/26: Patient responded appropriately to IV hydration. Blood pressure at 142/61. Patient met sepsis criteria, most likely secondary to UTI, UA positive for hematuria and large leukocytes, blood cultures negative.  Procalcitonin negative and urine cultures pending. Patient is currently on ceftriaxone. Potassium at 3.0 with normal magnesium-being repleted.  PT evaluated her as she wants to go to rehab but did not qualify as she is at her baseline.  They are recommending home health services which were ordered.  Prior urine cultures with E. coli and Klebsiella pneumonia with good sensitivity.  She is being discharged on Keflex while current urine cultures are pending.  Patient was on multiple medications which can lower blood pressure which include Cardizem, losartan, HCTZ, torsemide, spironolactone along with high-dose oxycodone which can all be contributory. We discontinued losartan and HCTZ and she was told to hold torsemide and  spironolactone until she sees her primary care provider as blood pressure was borderline.  She need to work with her PCP to wean off from oxycodone to a minimum dose if possible.  Patient will continue on her current medications and need to have a close follow-up with her PCP for further recommendations.

## 2021-11-28 NOTE — Assessment & Plan Note (Signed)
-   Eliquis 5 mg p.o. twice daily ordered

## 2021-11-29 DIAGNOSIS — R55 Syncope and collapse: Principal | ICD-10-CM

## 2021-11-29 LAB — BASIC METABOLIC PANEL
Anion gap: 12 (ref 5–15)
BUN: 36 mg/dL — ABNORMAL HIGH (ref 8–23)
CO2: 32 mmol/L (ref 22–32)
Calcium: 9.2 mg/dL (ref 8.9–10.3)
Chloride: 89 mmol/L — ABNORMAL LOW (ref 98–111)
Creatinine, Ser: 0.94 mg/dL (ref 0.44–1.00)
GFR, Estimated: >60 mL/min (ref >60)
Glucose, Bld: 234 mg/dL — ABNORMAL HIGH (ref 70–99)
Potassium: 3 mmol/L — ABNORMAL LOW (ref 3.5–5.1)
Sodium: 133 mmol/L — ABNORMAL LOW (ref 135–145)

## 2021-11-29 LAB — GLUCOSE, CAPILLARY
Glucose-Capillary: 308 mg/dL — ABNORMAL HIGH (ref 70–99)
Glucose-Capillary: 347 mg/dL — ABNORMAL HIGH (ref 70–99)
Glucose-Capillary: 274 mg/dL — ABNORMAL HIGH (ref 70–99)

## 2021-11-29 LAB — CBC
HCT: 35.9 % — ABNORMAL LOW (ref 36.0–46.0)
Hemoglobin: 11.9 g/dL — ABNORMAL LOW (ref 12.0–15.0)
MCH: 27.9 pg (ref 26.0–34.0)
MCHC: 33.1 g/dL (ref 30.0–36.0)
MCV: 84.3 fL (ref 80.0–100.0)
Platelets: 271 10*3/uL (ref 150–400)
RBC: 4.26 MIL/uL (ref 3.87–5.11)
RDW: 16.4 % — ABNORMAL HIGH (ref 11.5–15.5)
WBC: 11.5 10*3/uL — ABNORMAL HIGH (ref 4.0–10.5)
nRBC: 0 % (ref 0.0–0.2)

## 2021-11-29 LAB — PROCALCITONIN: Procalcitonin: <0.1 ng/mL

## 2021-11-29 LAB — LACTIC ACID, PLASMA: Lactic Acid, Venous: 1.4 mmol/L (ref 0.5–1.9)

## 2021-11-29 LAB — VITAMIN B12: Vitamin B-12: 748 pg/mL (ref 180–914)

## 2021-11-29 MED ORDER — SODIUM CHLORIDE 0.9 % IV SOLN: 1.0000 g | INTRAVENOUS | Status: DC

## 2021-11-29 MED ORDER — SODIUM CHLORIDE 0.9% FLUSH
10.0000 mL | Freq: Two times a day (BID) | INTRAVENOUS | Status: DC
  Administered 2021-11-29: 10 mL

## 2021-11-29 MED ORDER — CEPHALEXIN 500 MG PO CAPS: 500.0000 mg | ORAL_CAPSULE | Freq: Three times a day (TID) | ORAL | 0 refills | Status: AC

## 2021-11-29 MED ORDER — SODIUM CHLORIDE 0.9 % IV SOLN
1.0000 g | INTRAVENOUS | Status: DC
  Filled 2021-11-29: qty 10

## 2021-11-29 MED ORDER — SPIRONOLACTONE 25 MG PO TABS
25.0000 mg | ORAL_TABLET | Freq: Every day | ORAL | Status: DC
  Administered 2021-11-29: 25 mg via ORAL
  Filled 2021-11-29: qty 1

## 2021-11-29 MED ORDER — POTASSIUM CHLORIDE CRYS ER 20 MEQ PO TBCR
40.0000 meq | EXTENDED_RELEASE_TABLET | Freq: Once | ORAL | Status: AC
  Administered 2021-11-29: 40 meq via ORAL
  Filled 2021-11-29: qty 2

## 2021-11-29 NOTE — Evaluation (Signed)
Clinical/Bedside Swallow Evaluation Patient Details  Name: Karen [name] RECORD MRN: [id number] Date of Birth: 03-04-1946  Today's Date: 11/29/2021 Time: SLP Start Time (ACUTE ONLY): 36 SLP Stop Time (ACUTE ONLY): 1325 SLP Time Calculation (min) (ACUTE ONLY): 45 min  Past Medical History:  Past Medical History:  Diagnosis Date   (HFpEF) heart failure with preserved ejection fraction ([place])    a. 2017 Echo: EF 50%; b. 06/2018 Echo: EF 50-55%; c. 08/2018 Echo: EF 50-55%, Nl RV fxn; d. 09/2019 Echo: EF 55-60%, no rwma, mild LVH, Gr1 DD, nl RV size/fxn, PASP 63.80mmHg. Mildly dil LA. Triv MR. Mod AS (AoV 0.94cm^2 VTI; mean grad 17.3mmHg); d. 02/2021 Echo: EF 60-65%, no rwma, GrI DD, mildly red RV fxn, RVSP 64.10mmHg, mild-mod MR, mod-sev TR, mild AS.   Acute on chronic respiratory failure with hypoxia and hypercapnia (HCC) 01/07/2015   Anemia    Asterixis 01/07/2015   Asthma    Cataract    CKD (chronic kidney disease), stage III (HCC)    COPD (chronic obstructive pulmonary disease) ([place])    a. 06/2018 tobacco use, home 3L oxygen    Diabetes mellitus without complication ([place])    a. 09/2019 A1C 8.8   Edema, peripheral 04/20/2014   GI bleed 06/28/2019   History of kidney stones    Hyperlipidemia    Hypertension    Iron deficiency anemia 06/22/2014   Junctional bradycardia    a. In setting of beta blocker therapy.   Leucocytosis 10/19/2015   Moderate aortic stenosis    a.  09/2019 Echo: Mod AS (AoV 0.94cm^2 VTI; mean grad 17.44mmHg); b. 02/2021 Echo: Mild AS.   Morbid obesity ([place])    Overactive bladder    Primary osteoarthritis of right knee 09/01/2016   Sciatica 01/07/2015   Valvular heart disease    a. 02/2021 Echo: EF 60-65%, no rwma, GrI DD, mild-mod MR, mod-sev TR, mild AS   Past Surgical History:  Past Surgical History:  Procedure Laterality Date   APPENDECTOMY     CESAREAN SECTION     x3   CHOLECYSTECTOMY     COLONOSCOPY WITH PROPOFOL N/A 08/28/2017   Procedure: COLONOSCOPY WITH  PROPOFOL;  Surgeon: Lucilla Lame, MD;  Location: [facility] ENDOSCOPY;  Service: Endoscopy;  Laterality: N/A;   COLONOSCOPY WITH PROPOFOL N/A 08/29/2017   Procedure: COLONOSCOPY WITH PROPOFOL;  Surgeon: Lucilla Lame, MD;  Location: [facility] ENDOSCOPY;  Service: Endoscopy;  Laterality: N/A;   CYSTOSCOPY W/ URETERAL STENT PLACEMENT Right 09/15/2017   Procedure: CYSTOSCOPY WITH RETROGRADE PYELOGRAM/URETERAL STENT PLACEMENT;  Surgeon: Cleon Gustin, MD;  Location: [facility] ORS;  Service: Urology;  Laterality: Right;   CYSTOSCOPY/URETEROSCOPY/HOLMIUM LASER/STENT PLACEMENT Right 10/09/2017   Procedure: CYSTOSCOPY/URETEROSCOPY/HOLMIUM LASER/STENT PLACEMENT;  Surgeon: Abbie Sons, MD;  Location: [facility] ORS;  Service: Urology;  Laterality: Right;  right Stent exchange   ESOPHAGOGASTRODUODENOSCOPY (EGD) WITH PROPOFOL N/A 09/16/2018   Procedure: ESOPHAGOGASTRODUODENOSCOPY (EGD) WITH PROPOFOL;  Surgeon: Lin Landsman, MD;  Location: [place];  Service: Gastroenterology;  Laterality: N/A;   EYE SURGERY     IR THORACENTESIS ASP PLEURAL SPACE W/IMG GUIDE  02/07/2021   HPI:  Pt is a 76 year old female with history of Obesity, GERD, CHF, COPD, pulmonary hypertension, insulin-dependent diabetes mellitus, permanent atrial fibrillation, hyperlipidemia, CKD 3B, on chronic 3 L nasal cannula, OSA, who presents emergency department for chief concerns of syncopal episode and hypotension.   Head CT: No evidence of acute intracranial abnormality.  2. Minimal chronic small-vessel ischemic changes within the cerebral  white matter.  3.  Moderate cerebral and cerebellar atrophy.  CXR: Tiny bilateral pleural effusions.  2. COPD. Low Na per EMS, at admit.   Assessment / Plan / Recommendation  Clinical Impression   Pt seen for BSE today; pt finishing her lunch meal upon entering room. She was verbal and followed instructions adequately. Husband present.  Pt on [place] O2 support; afebrile. Na low at 133.     Pt appears to present w/  grossly adequate oropharyngeal phase swallow function w/ No overt oropharyngeal phase dysphagia noted, No neuromuscular deficits noted. Pt consumed po trials w/ No immediate, overt, clinical s/s of aspiration during po trials. Pt appears at reduced risk for aspiration following general aspiration precautions.  However, pt does have challenging factors of GERD, COPD, and deconditioning that could impact her oropharyngeal swallowing. These factors can increase risk for dysphagia, aspiration as well as decreased oral intake overall. Risks are reduced following general aspiration precautions.  During po trials, pt consumed consistencies w/ no overt coughing, decline in vocal quality, or change in respiratory presentation during/post trials. Oral phase appeared grossly [facility] w/ timely bolus management, mastication, and control of bolus propulsion for A-P transfer for swallowing. Oral clearing achieved w/ all trial consistencies -- moistened, soft foods were consumed (and recommended).  OM Exam appeared [facility] w/ no unilateral weakness noted. Speech Clear. Pt fed self w/ setup support.  Recommend continue a fairly Regular consistency diet w/ well-Cut meats, moistened foods; Thin liquids -- carefully monitor any straw use. Recommend general aspiration and REFLUX precautions. Tray setup and support sitting up for oral intake. Pills WHOLE in Puree for safer, easier swallowing IF any difficulty swallowing w/ liquids.  Education given on Pills in Puree; food consistencies and easy to eat options d/t missing Dentition; general aspiration precautions to pt and Dtr. NSG to reconsult if any new needs arise. NSG updated, agreed. MD updated. Recommend Dietician f/u for support. SLP Visit Diagnosis: Dysphagia, unspecified (R13.10)    Aspiration Risk   (reduced following general precautions)    Diet Recommendation   a fairly Regular consistency diet w/ well-Cut meats, moistened foods; Thin liquids -- carefully monitor any straw  use. Recommend general aspiration and REFLUX precautions. Tray setup and support sitting up for oral intake.  Medication Administration: Whole meds with liquid (or Whole in puree if needed)    Other  Recommendations Recommended Consults:  (management of GERD) Oral Care Recommendations: Oral care BID;Oral care before and after PO;Patient independent with oral care (setup support) Other Recommendations:  (n/a)    Recommendations for follow up therapy are one component of a multi-disciplinary discharge planning process, led by the attending physician.  Recommendations may be updated based on patient status, additional functional criteria and insurance authorization.  Follow up Recommendations No SLP follow up      Assistance Recommended at Discharge None  Functional Status Assessment Patient has not had a recent decline in their functional status  Frequency and Duration  (n/a)   (n/a)       Prognosis Prognosis for Safe Diet Advancement: Good Barriers to Reach Goals:  (deconditioned)      Swallow Study   General Date of Onset: 11/28/21 HPI: Pt is a 76 year old female with history of Obesity, GERD, CHF, COPD, pulmonary hypertension, insulin-dependent diabetes mellitus, permanent atrial fibrillation, hyperlipidemia, CKD 3B, on chronic 3 L nasal cannula, OSA, who presents emergency department for chief concerns of syncopal episode and hypotension.   Head CT: No evidence of acute intracranial abnormality.  2. Minimal chronic small-vessel ischemic  changes within the cerebral  white matter.  3. Moderate cerebral and cerebellar atrophy.  CXR: Tiny bilateral pleural effusions.  2. COPD. Type of Study: Bedside Swallow Evaluation Previous Swallow Assessment: none Diet Prior to this Study: Regular;Thin liquids Temperature Spikes Noted: No (wbc 11.5 declining) Respiratory Status: Nasal cannula (3L) History of Recent Intubation: No Behavior/Cognition: Alert;Cooperative;Pleasant mood;Requires cueing  (min for sitting up for po's) Oral Cavity Assessment: Within Functional Limits Oral Care Completed by SLP: Recent completion by staff Oral Cavity - Dentition: Missing dentition (several) Vision: Functional for self-feeding Self-Feeding Abilities: Able to feed self;Needs set up Patient Positioning: Upright in bed (needed cues for sitting more upright in bed for po intake) Baseline Vocal Quality: Normal Volitional Cough: Strong Volitional Swallow: Able to elicit    Oral/Motor/Sensory Function Overall Oral Motor/Sensory Function: Within functional limits   Ice Chips Ice chips: Not tested   Thin Liquid Thin Liquid: Within functional limits Presentation: Self Fed;Straw (8+ trials) Other Comments: multiple sips    Nectar Thick Nectar Thick Liquid: Not tested   Honey Thick Honey Thick Liquid: Not tested   Puree Puree: Within functional limits Presentation: Spoon;Self Fed (4ozs)   Solid     Solid: Within functional limits (grossly adequate -- missing dentition for most effective mastication) Presentation: Self Fed;Spoon (3+ bites, lunch meal)         Orinda Kenner, MS, CCC-SLP Speech Language Pathologist Rehab Services; [place] [phone] (ascom) Gerrell Tabet 11/29/2021,1:39 PM

## 2021-11-29 NOTE — TOC Transition Note (Signed)
Transition of Care [facility]) - CM/SW Discharge Note   Patient Details  Name: ISZABELLA HEBENSTREIT MRN: [id number] Date of Birth: 15-Feb-1946  Transition of [facility]) CM/SW Contact:  Candie Chroman, LCSW Phone Number: 11/29/2021, 5:13 PM   Clinical Narrative:   Patient has orders to discharge home today. Patient is already active with [facility] and was also receiving outpatient palliative services through [facility]. MD recommends continuing hospice based on her conversation with husband. Patient is agreeable to this and understands that means she won't get home health therapy, RN, aide. No further concerns. CSW signing off.  Final next level of care: Home w Hospice Care Barriers to Discharge: No Barriers Identified   Patient Goals and CMS Choice     Choice offered to / list presented to : NA  Discharge Placement                Patient to be transferred to facility by: Husband   Patient and family notified of of transfer: 11/29/21  Discharge Plan and Services     Post Acute Care Choice: Home Health                               Social Determinants of Health (SDOH) Interventions     Readmission Risk Interventions    10/30/2020    4:17 PM 06/21/2020    3:03 PM 09/30/2019   11:16 AM  Readmission Risk Prevention Plan  Transportation Screening Complete Complete Complete  PCP or Specialist Appt within 3-5 Days   Complete  Social Work Consult for [place] Planning/Counseling   Complete  Palliative Care Screening   Not Applicable  Medication Review Press photographer) Complete Complete Complete  PCP or Specialist appointment within 3-5 days of discharge Complete Complete   HRI or Home Care Consult Patient refused Complete   SW Recovery Care/Counseling Consult Complete Complete   Palliative Care Screening Not Applicable Not [place] Not Applicable Not Applicable

## 2021-11-29 NOTE — Evaluation (Signed)
Physical Therapy Evaluation Patient Details Name: Karen [name] MRN: [id number] DOB: 04-21-1945 Today's Date: 11/29/2021  History of Present Illness  Pt admitted for near syncope with history of pulm hypertension, DM, Afiv, and HLD.  Clinical Impression  Pt is a pleasant 76 year old female who was admitted for near syncope. Pt performs bed mobility with mod I, transfers with cga, and ambulation with with cga and RW. Pt found soiled in bed taking extended time for clean up, ambulation to bathroom, and back to recliner. Pt declines further activity at this time. Pt demonstrates deficits with endurance/mobility. Pt is household Ambulator and reports she doesn't leave her home. Pt is at baseline level and has all DME for safe dc to home. Would benefit from skilled PT to address above deficits and promote optimal return to PLOF. Recommend transition to [facility] upon discharge from acute hospitalization.  SaO2 on room air at rest = 83% SaO2 on room air while ambulating = n/a% SaO2 on 3 liters of O2 while ambulating = 98%       Recommendations for follow up therapy are one component of a multi-disciplinary discharge planning process, led by the attending physician.  Recommendations may be updated based on patient status, additional functional criteria and insurance authorization.  Follow Up Recommendations Home health PT      Assistance Recommended at Discharge Set up Supervision/Assistance  Patient can return home with the following  A little help with walking and/or transfers;A little help with bathing/dressing/bathroom;Help with stairs or ramp for entrance    Equipment Recommendations None recommended by PT  Recommendations for Other Services       Functional Status Assessment Patient has had a recent decline in their functional status and demonstrates the ability to make significant improvements in function in a reasonable and predictable amount of time.     Precautions / Restrictions  Precautions Precautions: Fall Restrictions Weight Bearing Restrictions: No      Mobility  Bed Mobility Overal bed mobility: Modified Independent             General bed mobility comments: safe technique. During mobility, pt noted to be covered in stool. While seated at EOB, pt able to maintain balance while Probation officer performed hygiene    Transfers Overall transfer level: Needs assistance Equipment used: Rolling walker (2 wheels) Transfers: Sit to/from Stand Sit to Stand: Min guard           General transfer comment: cues for hand placement. RW used    Ambulation/Gait Ambulation/Gait assistance: Counsellor (Feet): 20 Feet Assistive device: Rolling walker (2 wheels) Gait Pattern/deviations: Step-to pattern       General Gait Details: ambulated in room/bathroom. RW used and Pt on 3L of O2 with sats WNL  Stairs            Wheelchair Mobility    Modified Rankin (Stroke Patients Only)       Balance Overall balance assessment: Needs assistance Sitting-balance support: Feet supported Sitting balance-Leahy Scale: Good     Standing balance support: Bilateral upper extremity supported Standing balance-Leahy Scale: Good                               Pertinent Vitals/Pain Pain Assessment Pain Assessment: No/denies pain    Home Living Family/patient expects to be discharged to:: Private residence Living Arrangements: Spouse/significant other Available Help at Discharge: Family;Available 24 hours/day Type of Home: Mobile home Home Access: Ramped  entrance       Home Layout: One level Home Equipment: Grab bars - toilet;Grab bars - tub/shower;Rollator (4 wheels);Rolling Walker (2 wheels);BSC/3in1;Wheelchair - manual      Prior Function Prior Level of Function : Needs assist             Mobility Comments: amb with rollator house hold distances, mwc community distances ADLs Comments: Pt husband assists with IADLs; bathing,  LB dressing;     Hand Dominance        Extremity/Trunk Assessment   Upper Extremity Assessment Upper Extremity Assessment: Generalized weakness (B UE grossly 4/5)    Lower Extremity Assessment Lower Extremity Assessment: Overall WFL for tasks assessed       Communication   Communication: No difficulties  Cognition Arousal/Alertness: Awake/alert Behavior During Therapy: WFL for tasks assessed/performed Overall Cognitive Status: Within Functional Limits for tasks assessed                                 General Comments: sleepy upon arrival, however wakes up and is alert and oriented        General Comments      Exercises Other Exercises Other Exercises: ambulated to bathroom to void, cga for transfers/hygiene. Other Exercises: pt noted to be soiled upon arrival, max assist given for linen change, gown change and bed change.   Assessment/Plan    PT Assessment Patient needs continued PT services  PT Problem List Decreased strength;Decreased balance;Decreased mobility;Cardiopulmonary status limiting activity       PT Treatment Interventions Gait training;Therapeutic exercise;Balance training    PT Goals (Current goals can be found in the Care Plan section)  Acute Rehab PT Goals Patient Stated Goal: to go to rehab PT Goal Formulation: With patient Time For Goal Achievement: 12/13/21 Potential to Achieve Goals: Good    Frequency Min 2X/week     Co-evaluation               AM-PAC PT "6 Clicks" Mobility  Outcome Measure Help needed turning from your back to your side while in a flat bed without using bedrails?: None Help needed moving from lying on your back to sitting on the side of a flat bed without using bedrails?: None Help needed moving to and from a bed to a chair (including a wheelchair)?: A Little Help needed standing up from a chair using your arms (e.g., wheelchair or bedside chair)?: A Little Help needed to walk in hospital  room?: A Little Help needed climbing 3-5 steps with a railing? : A Little 6 Click Score: 20    End of Session Equipment Utilized During Treatment: Gait belt;Oxygen Activity Tolerance: Patient tolerated treatment well Patient left: in chair Nurse Communication: Mobility status PT Visit Diagnosis: Muscle weakness (generalized) (M62.81);Difficulty in walking, not elsewhere classified (R26.2);Unsteadiness on feet (R26.81)    Time: 6629-4765 PT Time Calculation (min) (ACUTE ONLY): 35 min   Charges:   PT Evaluation $PT Eval Low Complexity: 1 Low PT Treatments $Therapeutic Activity: 23-37 mins        Greggory Stallion, PT, DPT, GCS [phone]   Anika Shore 11/29/2021, 4:56 PM

## 2021-11-29 NOTE — Progress Notes (Signed)
[facility] Hospital Liaison note: ° °This patient is currently enrolled in [facility] outpatient-based Palliative Care. Will continue to follow for disposition. ° °Please call with any outpatient palliative questions or concerns. ° °Thank you, °Dee Curry, LPN °[facility] Hospital Liaison °[phone] °

## 2021-11-29 NOTE — TOC Initial Note (Addendum)
Transition of Care [facility]) - Initial/Assessment Note    Patient Details  Name: Karen [name] MRN: [id number] Date of Birth: October 08, 1945  Transition of Care [facility]) CM/SW Contact:    Candie Chroman, LCSW Phone Number: 11/29/2021, 4:32 PM  Clinical Narrative:   CSW met with patient. No supports at bedside. CSW introduced role and explained that PT recommendations would be discussed. Patient wants to go to [facility] but explained that PT does not think that is medically necessary. No home health agency preference. Starting search. No DME needs. Patient has home oxygen through [employer] and said her husband can bring it when he picks her up.                4:54 pm: [employer] thought patient was still under hospice services. Per [facility], she is just receiving outpatient palliative services. [employer] liaison is checking to see if patient needs new sats test and DME order. PT will put sats info in her note. [facility] has accepted the home health referral for PT, OT, RN, aide. Patient is aware. She wants to talk to the doctor about changing the recommendation. Explained that PT makes recommendations based on PLOF and current function and insurance will not cover SNF will not cover SNF if they are recommending home health.  Expected Discharge Plan: [place] Barriers to Discharge:  [facility] setup)   Patient Goals and CMS Choice        Expected Discharge Plan and Services Expected Discharge Plan: [place] Choice: [place] arrangements for the past 2 months: Single Family Home Expected Discharge Date: 11/29/21                                    Prior Living Arrangements/Services Living arrangements for the past 2 months: Single Family Home Lives with:: Spouse Patient language and need for interpreter reviewed:: Yes Do you feel safe going back to the place where you live?: Yes      Need for Family Participation in  Patient Care: Yes (Comment) Care giver support system in place?: Yes (comment) Current home services: DME Criminal Activity/Legal Involvement Pertinent to Current Situation/Hospitalization: No - Comment as needed  Activities of Daily Living Home Assistive Devices/Equipment: Walker (specify type) (rolling) ADL Screening (condition at time of admission) Patient's cognitive ability adequate to safely complete daily activities?: Yes Is the patient deaf or have difficulty hearing?: No Does the patient have difficulty seeing, even when wearing glasses/contacts?: No Does the patient have difficulty concentrating, remembering, or making decisions?: No Patient able to express need for assistance with ADLs?: Yes Does the patient have difficulty dressing or bathing?: No Independently performs ADLs?: Yes (appropriate for developmental age) Does the patient have difficulty walking or climbing stairs?: Yes Weakness of Legs: Both Weakness of Arms/Hands: None  Permission Sought/Granted Permission sought to share information with : Chartered certified accountant granted to share information with : Yes, Verbal Permission Granted     Permission granted to share info w AGENCY: Home health agencies        Emotional Assessment Appearance:: Appears stated age Attitude/Demeanor/Rapport: Engaged Affect (typically observed): Frustrated Orientation: : Oriented to Self, Oriented to Place, Oriented to  Time, Oriented to Situation Alcohol / Substance Use: Not Applicable Psych Involvement: No (comment)  Admission diagnosis:  Near syncope [R55] Hypotension, unspecified hypotension type [I95.9] Sepsis,  due to unspecified organism, unspecified whether acute organ dysfunction present [facility]) [A41.9] Patient Active Problem List   Diagnosis Date Noted   Near syncope 11/28/2021   Stage 3b chronic kidney disease (CKD) ([place]) 11/28/2021   Coughing 11/28/2021   Sepsis secondary to UTI ([place]) 11/28/2021    Pericardial effusion    Permanent atrial fibrillation ([place])    Severe pulmonary hypertension ([place]) 05/29/2021   OSA (obstructive sleep apnea) 05/29/2021   Elevated troponin 05/28/2021   Lower abdominal pain 05/28/2021   Prolonged QT interval 05/28/2021   At risk for fall due to comorbid condition 05/02/2021   Pleural effusion    Acute kidney injury superimposed on CKD ([place])    Impaired gait and mobility 12/20/2020   Pulmonary nodules/lesions, multiple 10/29/2020   Anemia in chronic kidney disease 10/29/2020   Aortic stenosis, moderate    AF (paroxysmal atrial fibrillation) (HCC)    Gastroesophageal reflux disease without esophagitis    Chronic respiratory failure with hypercapnia ([place]) 05/21/2020   Severe sepsis ([place]) 05/21/2020   UTI (urinary tract infection) 05/21/2020   Hyperglycemia due to type 2 diabetes mellitus ([place]) 05/21/2020   Abnormal CT of liver 05/21/2020   Sepsis ([place]) 05/21/2020   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 05/21/2020   Morbid obesity ([place]) 05/08/2020   Athscl heart disease 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 03/06/2020   Acute hip pain, left 10/23/2019   Lumbar stenosis with neurogenic claudication 10/23/2019   COPD with acute exacerbation ([place]) 62/95/2841   Acute diastolic CHF (congestive heart failure) ([place]) 09/28/2019   (HFpEF) heart failure with preserved ejection fraction ([place]) 09/27/2019   Hypokalemia 06/28/2019   Hyponatremia 06/28/2019   Uncontrolled type 2 diabetes mellitus with hyperglycemia, with long-term current use of insulin ([place]) 06/28/2019   CKD (chronic kidney disease), stage IIIa 06/28/2019   Atrial fibrillation, chronic ([place]) 06/28/2019   Pulmonary edema 02/27/2019   Bradycardia 02/24/2019   Acute on chronic diastolic CHF (congestive heart failure) ([place]) 02/23/2019   Acute on chronic respiratory failure with hypoxia ([place]) 02/23/2019   Osteopenia of neck of left femur 11/20/2018   AVM (arteriovenous malformation) of small  bowel, acquired    Acute gastric ulcer with hemorrhage    Chronic diastolic heart failure ([place]) 08/22/2018   Diarrhea 08/22/2018   Junctional bradycardia    Acute on chronic heart failure with preserved ejection fraction (HFpEF) ([place])    AKI (acute kidney injury) ([place])    Symptomatic bradycardia 08/05/2018   Acute on chronic respiratory failure ([place]) 06/25/2018   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 diabetes mellitus ([place]) 01/25/2018   History of non anemic vitamin B12 deficiency 01/25/2018   Personal history of kidney stones 11/12/2017   Urge incontinence 11/12/2017   History of leukocytosis 09/17/2017   GI bleed 08/26/2017   Arthritis 08/10/2017   Stage 4 chronic kidney disease ([place]) 08/10/2017   COPD (chronic obstructive pulmonary disease) ([place]) 08/10/2017   Diabetes mellitus type 2, uncomplicated ([place]) 32/44/0102   Hypertension 08/10/2017   Obesity (BMI 35.0-39.9 without comorbidity) 04/11/2017   Primary osteoarthritis of right knee 09/01/2016   Leucocytosis 10/19/2015   Asterixis 01/07/2015   Acute on chronic respiratory failure with hypoxia and hypercapnia ([place]) 01/07/2015   Sciatica 01/07/2015   Weakness 01/07/2015   Chronic 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 01/04/2015   Iron deficiency anemia 06/22/2014   Microalbuminuria 06/22/2014   CHF (congestive heart failure) ([place]) 04/20/2014   Edema, peripheral 04/20/2014   PCP:  Matilde Bash,  Clement Sayres, MD Pharmacy:   [facility] [address] [place] [address] [place] [address] Phone: [phone] Fax: [phone]     Social Determinants of Health (SDOH) Interventions    Readmission Risk Interventions    10/30/2020    4:17 PM 06/21/2020    3:03 PM 09/30/2019   11:16 AM  Readmission Risk Prevention Plan  Transportation Screening Complete Complete Complete  PCP or Specialist Appt within 3-5 Days   Complete  Social Work Consult for [place]  Planning/Counseling   Complete  Palliative Care Screening   Not Applicable  Medication Review Press photographer) Complete Complete Complete  PCP or Specialist appointment within 3-5 days of discharge Complete Complete   HRI or Home Care Consult Patient refused Complete   SW Recovery Care/Counseling Consult Complete Complete   Palliative Care Screening Not Applicable Not [place] Not Applicable Not Applicable

## 2021-11-29 NOTE — Discharge Summary (Signed)
Physician Discharge Summary   Patient: Karen [name] MRN: [id number] DOB: [birth year]  Admit date:     11/28/2021  Discharge date: 11/29/21  Discharge Physician: Karen [name]   PCP: Karen Rana, MD   Recommendations at discharge:  Please obtain CBC and BMP in 1 week Follow with primary care provider within a week Follow-up final urine culture results  Discharge Diagnoses: Principal Problem:   Near syncope Active Problems:   Sepsis secondary to UTI ([place])   Severe pulmonary hypertension (HCC)   OSA (obstructive sleep apnea)   Permanent atrial fibrillation (HCC)   Leucocytosis   Hypokalemia   Morbid obesity ([place])   Weakness   Hypertension   Obesity (BMI 35.0-39.9 without comorbidity)   Sepsis ([place])   Stage 3b chronic kidney disease (CKD) ([place])   Coughing   Hospital Course: Mr. Karen [name] is a 76 year old female with history of pulmonary hypertension, insulin-dependent diabetes mellitus, permanent atrial fibrillation, hyperlipidemia, CKD 3B, on chronic 3 L nasal cannula, OSA, who presents emergency department for chief concerns of syncopal episode and hypotension.  Per ED note, patient received 850 mL of sodium chloride by EMS.  Initial vitals in the emergency department showed temperature of 97.6, respiration rate of 21, heart rate of 73, blood pressure 91/53, SPO2 of 100% on 3 L nasal cannula  Patient's blood pressures currently improved at 134/68.  Serum sodium is 130, potassium 3.1, chloride 85, bicarb 31, BUN of 42, serum creatinine 1.25, GFR 44, nonfasting blood glucose 186, WBC 13.6, hemoglobin 11.9, platelets of 276.  Lactic acid is 2.5 and decreased to 2.1.  BNP is 29.4.  High sensitive troponin was 7 and on repeat was 8. Procalcitonin < 0.10.  ED treatment: Cefepime 2 g IV, LR 2 L bolus, LR 500 mL bolus, LR 150 mL/h.  9/26: Patient responded appropriately to IV hydration. Blood pressure at 142/61. Patient met sepsis criteria, most likely secondary to  UTI, UA positive for hematuria and large leukocytes, blood cultures negative.  Procalcitonin negative and urine cultures pending. Patient is currently on ceftriaxone. Potassium at 3.0 with normal magnesium-being repleted.  PT evaluated her as she wants to go to rehab but did not qualify as she is at her baseline.  They are recommending home health services which were ordered.  Prior urine cultures with E. coli and Klebsiella pneumonia with good sensitivity.  She is being discharged on Keflex while current urine cultures are pending.  Patient was on multiple medications which can lower blood pressure which include Cardizem, losartan, HCTZ, torsemide, spironolactone along with high-dose oxycodone which can all be contributory. We discontinued losartan and HCTZ and she was told to hold torsemide and spironolactone until she sees her primary care provider as blood pressure was borderline.  She need to work with her PCP to wean off from oxycodone to a minimum dose if possible.  Patient will continue on her current medications and need to have a close follow-up with her PCP for further recommendations.  Assessment and Plan: * Near syncope - Presumptive diagnosis secondary to dehydration, hypotension - Patient responded well to aggressive fluid hydration by EMS and EDP - We will continue with LR 150 mL/h, 20 hours ordered by EDP - Admit to telemetry cardiac, observation  Sepsis secondary to UTI [facility]) - Present on admission - Ceftriaxone 2 g IV daily, ordered for additional doses starting on 11/29/2021 to complete 5-day course - Urine culture added  Severe pulmonary hypertension ([place]) - CPAP nightly ordered  OSA (obstructive sleep  apnea) - CPAP nightly ordered  Permanent atrial fibrillation (HCC) - Eliquis 5 mg p.o. twice daily ordered  Hypokalemia - Magnesium on admission was within normal limits - Polycitra 10 mill equivalent, p.o., 3 times daily, 4 doses ordered - BMP in  a.m.  Leucocytosis - Etiology work-up in progress, differentials include UTI - Patient is status post cefepime - UA was positive for leukocytes - CBC in the a.m.  Morbid obesity ([place]) - This meets criteria for morbid obesity based on the presence of 1 or more chronic comorbidities. Patient has hypertension, insulin-dependent diabetes mellitus with a BMI of 35.4. This complicates overall care and prognosis.   Coughing - Patient was having dinner and had a coughing fit - Nursing came in and found that her IV was pulled out and there was blood in her rice and patient kept eating her food - Patient states her throat was very good - Aspiration precaution ordered, n.p.o. except for sips with meds - Portable chest x-ray ordered to assess for aspiration pneumonia - Procalcitonin on admission was negative - I continued ordering procalcitonin every morning to ensure patient does not develop aspiration pneumonia - SLP ordered  Stage 3b chronic kidney disease (CKD) ([place]) - At baseline  Sepsis ([place]) - Patient had leukocytosis of 13.6, elevated lactic acid, UA was positive for leukocytes - Urine culture added - Blood cultures x2 ordered - Continue ceftriaxone 2 g IV daily - Admit to telemetry cardiac, observation  Obesity (BMI 35.0-39.9 without comorbidity) - This meets criteria for morbid obesity based on the presence of 1 or more chronic comorbidities. Patient has hyperlipidemia and diabetes with a BMI of 35.4. This complicates overall care and prognosis.   Hypertension - Torsemide 20 mg p.o. twice daily resumed - Hydralazine 10 mg p.o. every 6 hours as needed for SBP greater than 180, 4 days  Weakness - Check B12   Pain control - [place] Controlled Substance Reporting System database was reviewed. and patient was instructed, not to drive, operate heavy machinery, perform activities at heights, swimming or participation in water activities or provide baby-sitting services while  on Pain, Sleep and Anxiety Medications; until their outpatient Physician has advised to do so again. Also recommended to not to take more than prescribed Pain, Sleep and Anxiety Medications.  Consultants: None Procedures performed: None Disposition: Home health Diet recommendation:  Discharge Diet Orders (From admission, onward)     Start     Ordered   11/29/21 0000  Diet - low sodium heart healthy        11/29/21 1619           Cardiac and Carb modified diet DISCHARGE MEDICATION: Allergies as of 11/29/2021       Reactions   Ace Inhibitors Hives   Beta Adrenergic Blockers    Junctional bradycardia   Gabapentin Hives   Lisinopril Hives   Lyrica [pregabalin] Hives   Shrimp [shellfish Allergy] Swelling   Swelling of the lips        Medication List     STOP taking these medications    hydrochlorothiazide 25 MG tablet Commonly known as: HYDRODIURIL   levalbuterol 0.63 MG/3ML nebulizer solution Commonly known as: XOPENEX   losartan 100 MG tablet Commonly known as: COZAAR       TAKE these medications    acetaminophen 500 MG tablet Commonly known as: TYLENOL Take 1-2 tablets (500-1,000 mg total) by mouth every 6 (six) hours as needed for mild pain, fever, moderate pain or headache. Do  not take more than 4 grams a day   albuterol 108 (90 Base) MCG/ACT inhaler Commonly known as: VENTOLIN HFA Inhale 2 puffs into the lungs every 6 (six) hours as needed for wheezing or shortness of breath.   allopurinol 100 MG tablet Commonly known as: ZYLOPRIM Take 1 tablet by mouth daily.   atorvastatin 10 MG tablet Commonly known as: LIPITOR Take 1 tablet by mouth daily.   butalbital-acetaminophen-caffeine 50-325-40 MG tablet Commonly known as: FIORICET Take 1 tablet by mouth every 6 (six) hours as needed.   cephALEXin 500 MG capsule Commonly known as: KEFLEX Take 1 capsule (500 mg total) by mouth 3 (three) times daily for 5 days.   diltiazem 60 MG 12 hr  capsule Commonly known as: CARDIZEM SR Take 60 mg by mouth 2 (two) times daily.   Eliquis 5 MG Tabs tablet Generic drug: apixaban Take 1 tablet by mouth twice daily   empagliflozin 10 MG Tabs tablet Commonly known as: JARDIANCE Take 1 tablet (10 mg total) by mouth daily.   esomeprazole 40 MG capsule Commonly known as: NEXIUM Take 40 mg by mouth daily.   fluticasone 50 MCG/ACT nasal spray Commonly known as: FLONASE Place 2 sprays into both nostrils daily.   insulin glargine 100 UNIT/ML injection Commonly known as: LANTUS Inject 24 Units into the skin 2 (two) times daily with a meal.   insulin lispro 100 UNIT/ML KwikPen Commonly known as: HUMALOG Inject 12 Units into the skin in the morning, at noon, and at bedtime. 10 units with meals plus additional units for correction What changed: how much to take   insulin regular 100 units/mL injection Commonly known as: NOVOLIN R Inject 2-14 Units into the skin 3 (three) times daily before meals. Blood Sugar 201-250.. Give 2 units Blood Sugar 250-300. Give 4 units  Blood Sugar 301-350. Give 6 units Blood Sugar 351-400.  Give 8 units Blood Sugar 401-450 . Give 10 units Blood Sugar 451-500 Give 12 units Blood Sugar <500 Give 14 units   Iron 325 (65 Fe) MG Tabs Take 1 tablet (325 mg total) by mouth daily.   lidocaine 5 % Commonly known as: LIDODERM Place 2 patches onto the skin daily. Remove & Discard patch within 12 hours or as directed by MD   LORazepam 0.5 MG tablet Commonly known as: ATIVAN Take 0.5 mg by mouth every 4 (four) hours as needed.   melatonin 5 MG Tabs Take 5 mg by mouth at bedtime.   metFORMIN 500 MG tablet Commonly known as: GLUCOPHAGE Take 500 mg by mouth 2 (two) times daily.   montelukast 10 MG tablet Commonly known as: SINGULAIR Take 10 mg by mouth at bedtime.   Mucinex 600 MG 12 hr tablet Generic drug: guaiFENesin Take 600 mg by mouth daily.   Oxycodone HCl 10 MG Tabs Take 10 mg by mouth 4  (four) times daily as needed. What changed: Another medication with the same name was removed. Continue taking this medication, and follow the directions you see here.   senna-docusate 8.6-50 MG tablet Commonly known as: Senokot-S Take 1 tablet by mouth 2 (two) times daily between meals as needed for mild constipation.   spironolactone 25 MG tablet Commonly known as: ALDACTONE Take 1 tablet (25 mg total) by mouth daily.   torsemide 20 MG tablet Commonly known as: DEMADEX Take 20 mg by mouth 2 (two) times daily.   traZODone 50 MG tablet Commonly known as: DESYREL Take 1 tablet (50 mg total) by mouth at bedtime  as needed for sleep.   vitamin B-12 500 MCG tablet Commonly known as: CYANOCOBALAMIN Take 500 mcg by mouth daily.   Vitamin D (Cholecalciferol) 25 MCG (1000 UT) Caps Take 1 tablet by mouth daily.        Follow-up Information     Karen Rana, MD. Schedule an appointment as soon as possible for a visit in 1 week(s).   Specialty: Family Medicine Contact information: [place] [address] [phone]         [name], MD .   Specialty: Cardiology Contact information: [place] [address] [phone]                Discharge Exam: Danley Danker Weights   11/28/21 1305 11/29/21 0448  Weight: 90.7 kg 85.4 kg   General.  Obese elderly lady, in no acute distress. Pulmonary.  Lungs clear bilaterally, normal respiratory effort. CV.  Regular rate and rhythm, no JVD, rub or murmur. Abdomen.  Soft, nontender, nondistended, BS positive. CNS.  Alert and oriented .  No focal neurologic deficit. Extremities.  No edema, no cyanosis, pulses intact and symmetrical. Psychiatry.  Judgment and insight appears normal.   Condition at discharge: stable  The results of significant diagnostics from this hospitalization (including imaging, microbiology, ancillary and laboratory) are listed below for reference.    Imaging Studies: CT SOFT TISSUE NECK W CONTRAST  Result Date: 11/28/2021 CLINICAL DATA:  Pre syncopal episode mild headache; hypotension EXAM: CT NECK WITH CONTRAST TECHNIQUE: Multidetector CT imaging of the neck was performed using the standard protocol following the bolus administration of intravenous contrast. RADIATION DOSE REDUCTION: This exam was performed according to the departmental dose-optimization program which includes automated exposure control, adjustment of the mA and/or kV according to patient size and/or use of iterative reconstruction technique. CONTRAST:  35m OMNIPAQUE IOHEXOL 300 MG/ML  SOLN COMPARISON:  None Available. FINDINGS: Pharynx and larynx: Normal. No mass or swelling. Salivary glands: No inflammation, mass, or stone. Thyroid: Normal. Lymph nodes: None enlarged or abnormal density. Vascular: Patent. Aortic and carotid atherosclerotic calcifications. Limited intracranial: Negative. Visualized orbits: No acute finding. Status post bilateral lens replacements. Mastoids and visualized paranasal sinuses: Clear. Skeleton: No acute osseous abnormality.  Poor dentition. Upper chest: No focal pulmonary opacity or pleural effusion. Small calcified nodule along the left fissure, most likely sequela of prior granulomatous disease. Other: None. IMPRESSION: No acute finding in the neck. Electronically Signed   By: AMerilyn BabaM.D.   On: 11/28/2021 21:01   DG Chest Port 1 View  Result Date: 11/28/2021 CLINICAL DATA:  Presyncope, hypotension EXAM: PORTABLE CHEST 1 VIEW COMPARISON:  11/28/2021 FINDINGS: The lungs are mildly hyperinflated in keeping with changes of underlying COPD, unchanged from prior examination. The lungs are clear. Tiny bilateral pleural effusions. No pneumothorax. Cardiac size within normal limits. Pulmonary vascularity is normal. No acute bone abnormality. IMPRESSION: 1. Tiny bilateral pleural effusions. 2. COPD. Electronically Signed   By: AFidela SalisburyM.D.   On:  11/28/2021 20:21   CT Head Wo Contrast  Result Date: 11/28/2021 CLINICAL DATA:  Provided history: Headache. Tension type headache with near syncopal episode. EXAM: CT HEAD WITHOUT CONTRAST TECHNIQUE: Contiguous axial images were obtained from the base of the skull through the vertex without intravenous contrast. RADIATION DOSE REDUCTION: This exam was performed according to the departmental dose-optimization program which includes automated exposure control, adjustment of the mA and/or kV according to patient size and/or use of iterative reconstruction  technique. COMPARISON:  Prior head CT examinations 10/30/2020 and earlier. Brain MRI 09/29/2015. FINDINGS: Brain: Moderate cerebral and cerebellar atrophy. Minimal patchy and ill-defined hypoattenuation within the cerebral white matter, nonspecific but compatible with chronic small vessel ischemic disease. There is no acute intracranial hemorrhage. No demarcated cortical infarct. No extra-axial fluid collection. No evidence of an intracranial mass. No midline shift. Vascular: No hyperdense vessel. Atherosclerotic calcifications. Skull: No fracture or aggressive osseous lesion. Sinuses/Orbits: No mass or acute finding within the imaged orbits. No significant paranasal sinus disease at the imaged levels. Other: Trace fluid within left mastoid air cells. IMPRESSION: 1. No evidence of acute intracranial abnormality. 2. Minimal chronic small-vessel ischemic changes within the cerebral white matter. 3. Moderate cerebral and cerebellar atrophy. 4. Trace left mastoid effusion. Electronically Signed   By: Kellie Simmering D.O.   On: 11/28/2021 15:55   DG Chest 2 View  Result Date: 11/28/2021 CLINICAL DATA:  Weakness.  Syncope. EXAM: CHEST - 2 VIEW COMPARISON:  August 30, 2021 FINDINGS: Hyperinflation of the lungs. Probable tiny pleural effusions based on the lateral view. Mild atelectasis in the left base. The heart size remains mildly enlarged. The hila and mediastinum are  unremarkable. No pulmonary nodules or masses. No suspicious infiltrates. IMPRESSION: Hyperinflation of the lungs suggesting the possibility of COPD or emphysema. Recommend clinical correlation. Probable tiny pleural effusions. No other significant abnormalities. Electronically Signed   By: Dorise Bullion III M.D.   On: 11/28/2021 13:40    Microbiology: Results for orders placed or performed during the hospital encounter of 11/28/21  Blood culture (routine x 2)     Status: None (Preliminary result)   Collection Time: 11/28/21  3:12 PM   Specimen: BLOOD  Result Value Ref Range Status   Specimen Description BLOOD BLOOD RIGHT HAND  Final   Special Requests   Final    BOTTLES DRAWN AEROBIC AND ANAEROBIC Blood Culture adequate volume   Culture   Final    NO GROWTH < 24 HOURS Performed at [facility], [address], [place], [place] [address]    Report Status PENDING  Incomplete  SARS Coronavirus 2 by RT PCR (hospital order, performed in [place] hospital lab) *cepheid single result test* Anterior Nasal Swab     Status: None   Collection Time: 11/28/21  3:12 PM   Specimen: Anterior Nasal Swab  Result Value Ref Range Status   SARS Coronavirus 2 by RT PCR NEGATIVE NEGATIVE Final    Comment: (NOTE) SARS-CoV-2 target nucleic acids are NOT DETECTED.  The SARS-CoV-2 RNA is generally detectable in upper and lower respiratory specimens during the acute phase of infection. The lowest concentration of SARS-CoV-2 viral copies this assay can detect is 250 copies / mL. A negative result does not preclude SARS-CoV-2 infection and should not be used as the sole basis for treatment or other patient management decisions.  A negative result may occur with improper specimen collection / handling, submission of specimen other than nasopharyngeal swab, presence of viral mutation(s) within the areas targeted by this assay, and inadequate number of viral copies (<250 copies / mL). A negative  result must be combined with clinical observations, patient history, and epidemiological information.  Fact Sheet for Patients:   https://www.patel.info/  Fact Sheet for Healthcare Providers: https://hall.com/  This test is not yet approved or  cleared by the [place] FDA and has been authorized for detection and/or diagnosis of SARS-CoV-2 by FDA under an Emergency Use Authorization (EUA).  This EUA will remain  in effect (meaning this test can be used) for the duration of the COVID-19 declaration under Section 564(b)(1) of the Act, 21 U.S.C. section 360bbb-3(b)(1), unless the authorization is terminated or revoked sooner.  Performed at [facility], [address] [place], [place] [address]   Blood culture (routine x 2)     Status: None (Preliminary result)   Collection Time: 11/28/21  3:13 PM   Specimen: BLOOD  Result Value Ref Range Status   Specimen Description BLOOD BLOOD LEFT ARM  Final   Special Requests   Final    BOTTLES DRAWN AEROBIC AND ANAEROBIC Blood Culture results may not be optimal due to an inadequate volume of blood received in culture bottles   Culture   Final    NO GROWTH < 24 HOURS Performed at [facility], [address] [place], [place] [address]    Report Status PENDING  Incomplete    Labs: CBC: Recent Labs  Lab 11/28/21 1315 11/29/21 0406  WBC 13.6* 11.5*  NEUTROABS 11.2*  --   HGB 11.9* 11.9*  HCT 36.9 35.9*  MCV 85.4 84.3  PLT 276 325   Basic Metabolic Panel: Recent Labs  Lab 11/28/21 1315 11/28/21 1633 11/29/21 0406  NA 130*  --  133*  K 3.1*  --  3.0*  CL 85*  --  89*  CO2 31  --  32  GLUCOSE 186*  --  234*  BUN 42*  --  36*  CREATININE 1.25*  --  0.94  CALCIUM 8.8*  --  9.2  MG  --  2.0  --    Liver Function Tests: Recent Labs  Lab 11/28/21 1633  AST 17  ALT 9  ALKPHOS 69  BILITOT 0.7  PROT 6.5  ALBUMIN 3.3*   CBG: Recent Labs  Lab  11/28/21 2149 11/29/21 0838 11/29/21 1109  GLUCAP 257* 308* 347*    Discharge time spent: greater than 30 minutes.  This record has been created using Systems analyst. Errors have been sought and corrected,but may not always be located. Such creation errors do not reflect on the standard of care.   Signed: Lorella Nimrod, MD [facility] 11/29/2021

## 2021-11-29 NOTE — Progress Notes (Signed)
Discharge instructions provided to patient. All medications, follow up appointments, and discharge instructions discussed. IV out. Monitor off. CCMD notified.Discharging home with family.  Era Bumpers, RN

## 2021-11-30 LAB — URINE CULTURE

## 2021-12-03 LAB — CULTURE, BLOOD (ROUTINE X 2)
Culture: NO GROWTH
Culture: NO GROWTH
Special Requests: ADEQUATE

## 2021-12-12 ENCOUNTER — Encounter: Payer: Self-pay | Admitting: *Deleted

## 2021-12-14 NOTE — Progress Notes (Deleted)
Cardiology Office Note:    Date:  12/14/2021   ID:  Karen [name], DOB [birth year], MRN [id number]  PCP:  Margarita Rana, MD   [place] Providers Cardiologist:  Ida Rogue, MD { Click to update primary MD,subspecialty MD or APP then REFRESH:1}    Referring MD: Margarita Rana, MD   No chief complaint on file. ***  History of Present Illness:    Karen [name] is a 76 y.o. female with a hx of HFpEF, pulmonary hypertension, chronic hypoxic respiratory failure on supplemental O2, COPD, T2DM, HTN, HLD, PAF, aortic stenosis, and obesity. She was last seen in the office on 05/13/2021 by Dr. Rockey Situ. Most recently she was seen in the ED on 11/28/2021 for a near syncopal episode and hypotension. She was found to have a UTI and dehydration. She was discharged home on antibiotic and continuation of hospice care. She was instructed to stop losartan and HCTZ.   *** Recheck BMP?   She presents today for ***  Past Medical History:  Diagnosis Date   (HFpEF) heart failure with preserved ejection fraction ([place])    a. 2017 Echo: EF 50%; b. 06/2018 Echo: EF 50-55%; c. 08/2018 Echo: EF 50-55%, Nl RV fxn; d. 09/2019 Echo: EF 55-60%, no rwma, mild LVH, Gr1 DD, nl RV size/fxn, PASP 63.24mmHg. Mildly dil LA. Triv MR. Mod AS (AoV 0.94cm^2 VTI; mean grad 17.62mmHg); d. 02/2021 Echo: EF 60-65%, no rwma, GrI DD, mildly red RV fxn, RVSP 64.67mmHg, mild-mod MR, mod-sev TR, mild AS.   Acute on chronic respiratory failure with hypoxia and hypercapnia (HCC) 01/07/2015   Anemia    Asterixis 01/07/2015   Asthma    Cataract    CKD (chronic kidney disease), stage III (HCC)    COPD (chronic obstructive pulmonary disease) ([place])    a. 06/2018 tobacco use, home 3L oxygen    Diabetes mellitus without complication ([place])    a. 09/2019 A1C 8.8   Edema, peripheral 04/20/2014   GI bleed 06/28/2019   History of kidney stones    Hyperlipidemia    Hypertension    Iron deficiency anemia 06/22/2014   Junctional  bradycardia    a. In setting of beta blocker therapy.   Leucocytosis 10/19/2015   Moderate aortic stenosis    a.  09/2019 Echo: Mod AS (AoV 0.94cm^2 VTI; mean grad 17.53mmHg); b. 02/2021 Echo: Mild AS.   Morbid obesity ([place])    Overactive bladder    Primary osteoarthritis of right knee 09/01/2016   Sciatica 01/07/2015   Valvular heart disease    a. 02/2021 Echo: EF 60-65%, no rwma, GrI DD, mild-mod MR, mod-sev TR, mild AS    Past Surgical History:  Procedure Laterality Date   APPENDECTOMY     CESAREAN SECTION     x3   CHOLECYSTECTOMY     COLONOSCOPY WITH PROPOFOL N/A 08/28/2017   Procedure: COLONOSCOPY WITH PROPOFOL;  Surgeon: Lucilla Lame, MD;  Location: [facility] ENDOSCOPY;  Service: Endoscopy;  Laterality: N/A;   COLONOSCOPY WITH PROPOFOL N/A 08/29/2017   Procedure: COLONOSCOPY WITH PROPOFOL;  Surgeon: Lucilla Lame, MD;  Location: [facility] ENDOSCOPY;  Service: Endoscopy;  Laterality: N/A;   CYSTOSCOPY W/ URETERAL STENT PLACEMENT Right 09/15/2017   Procedure: CYSTOSCOPY WITH RETROGRADE PYELOGRAM/URETERAL STENT PLACEMENT;  Surgeon: Cleon Gustin, MD;  Location: [facility] ORS;  Service: Urology;  Laterality: Right;   CYSTOSCOPY/URETEROSCOPY/HOLMIUM LASER/STENT PLACEMENT Right 10/09/2017   Procedure: CYSTOSCOPY/URETEROSCOPY/HOLMIUM LASER/STENT PLACEMENT;  Surgeon: Abbie Sons, MD;  Location: [facility] ORS;  Service: Urology;  Laterality: Right;  right Stent exchange   ESOPHAGOGASTRODUODENOSCOPY (EGD) WITH PROPOFOL N/A 09/16/2018   Procedure: ESOPHAGOGASTRODUODENOSCOPY (EGD) WITH PROPOFOL;  Surgeon: Lin Landsman, MD;  Location: [place];  Service: Gastroenterology;  Laterality: N/A;   EYE SURGERY     IR THORACENTESIS ASP PLEURAL SPACE W/IMG GUIDE  02/07/2021    Current Medications: No outpatient medications have been marked as taking for the 12/15/21 encounter (Appointment) with Gerrie Nordmann, NP.     Allergies:   Ace inhibitors, Beta adrenergic blockers, Gabapentin, Lisinopril,  Lyrica [pregabalin], and Shrimp [shellfish allergy]   Social History   Socioeconomic History   Marital status: Married    Spouse name: Not on file   Number of children: Not on file   Years of education: Not on file   Highest education level: Not on file  Occupational History   Not on file  Tobacco Use   Smoking status: Former    Packs/day: 1.00    Years: 20.00    Total pack years: 20.00    Types: Cigarettes    Quit date: 12/04/1992    Years since quitting: 29.0   Smokeless tobacco: Never  Vaping Use   Vaping Use: Never used  Substance and Sexual Activity   Alcohol use: No   Drug use: No   Sexual activity: Not Currently  Other Topics Concern   Not on file  Social History Narrative   Not on file   Social Determinants of Health   Financial Resource Strain: Not on file  Food Insecurity: No Food Insecurity (11/28/2021)   Hunger Vital Sign    Worried About Running Out of Food in the Last Year: Never true    Ran Out of Food in the Last Year: Never true  Transportation Needs: No Transportation Needs (11/28/2021)   PRAPARE - Hydrologist (Medical): No    Lack of Transportation (Non-Medical): No  Physical Activity: Not on file  Stress: Not on file  Social Connections: Not on file     Family History: The patient's ***family history includes Other in her father and mother.  ROS:   Please see the history of present illness.    *** All other systems reviewed and are negative.  EKGs/Labs/Other Studies Reviewed:    The following studies were reviewed today: Echo: 08/04/2021 IMPRESSIONS    1. Left ventricular ejection fraction, by estimation, is 55 to 60%. The  left ventricle has normal function. The left ventricle has no regional  wall motion abnormalities. Left ventricular diastolic parameters are  consistent with Grade III diastolic  dysfunction (restrictive). Elevated left atrial pressure. There is the  interventricular septum is flattened  in systole, consistent with right  ventricular pressure overload.   2. Right ventricular systolic function is mildly reduced. The right  ventricular size is moderately enlarged. There is moderately elevated  pulmonary artery systolic pressure.   3. Left atrial size was mildly dilated.   4. The mitral valve is degenerative. Mild mitral valve regurgitation. No  evidence of mitral stenosis.   5. Tricuspid valve regurgitation is moderate.   6. The aortic valve has an indeterminant number of cusps. There is mild  calcification of the aortic valve. There is moderate thickening of the  aortic valve. Aortic valve regurgitation is not visualized. Mild to  moderate aortic valve stenosis. Aortic  valve area, by VTI measures 1.20 cm. Aortic valve mean gradient measures  10.3 mmHg.   7. The inferior vena cava is dilated in size  with <50% respiratory  variability, suggesting right atrial pressure of 15 mmHg.   08/22/2021 IMPRESSIONS    1. Left ventricular ejection fraction, by estimation, is 50 to 55%. The  left ventricle has low normal function. The left ventricle has no regional  wall motion abnormalities. Left ventricular diastolic function could not  be evaluated.   2. Right ventricular systolic function is normal. The right ventricular  size is normal. There is moderately elevated pulmonary artery systolic  pressure.   3. A small pericardial effusion is present. The pericardial effusion is  circumferential. Moderate pleural effusion in the left lateral region.   4. The mitral valve is normal in structure. No evidence of mitral valve  regurgitation. No evidence of mitral stenosis.   5. The aortic valve is normal in structure. Aortic valve regurgitation is  not visualized. No aortic stenosis is present.   6. The inferior vena cava is dilated in size with <50% respiratory  variability, suggesting right atrial pressure of 15 mmHg.  EKG:  EKG is *** ordered today.  The ekg ordered today  demonstrates ***  Recent Labs: 05/28/2021: TSH 3.386 11/28/2021: ALT 9; B Natriuretic Peptide 29.4; Magnesium 2.0 11/29/2021: BUN 36; Creatinine, Ser 0.94; Hemoglobin 11.9; Platelets 271; Potassium 3.0; Sodium 133  Recent Lipid Panel No results found for: "CHOL", "TRIG", "HDL", "CHOLHDL", "VLDL", "LDLCALC", "LDLDIRECT"   Risk Assessment/Calculations:   {Does this patient have ATRIAL FIBRILLATION?:[phone]}  No BP recorded.  {Refresh Note OR Click here to enter BP  :1}***         Physical Exam:    VS:  There were no vitals taken for this visit.    Wt Readings from Last 3 Encounters:  11/29/21 188 lb 4.4 oz (85.4 kg)  09/07/21 200 lb 9.9 oz (91 kg)  08/10/21 199 lb 8.3 oz (90.5 kg)     GEN: *** Well nourished, well developed in no acute distress HEENT: Normal NECK: No JVD; No carotid bruits LYMPHATICS: No lymphadenopathy CARDIAC: ***RRR, no murmurs, rubs, gallops RESPIRATORY:  Clear to auscultation without rales, wheezing or rhonchi  ABDOMEN: Soft, non-tender, non-distended MUSCULOSKELETAL:  No edema; No deformity  SKIN: Warm and dry NEUROLOGIC:  Alert and oriented x 3 PSYCHIATRIC:  Normal affect   ASSESSMENT:    No diagnosis found. PLAN:    In order of problems listed above:  ***      {Are you ordering a CV Procedure (e.g. stress test, cath, DCCV, TEE, etc)?   Press F2        :[id number]}    Medication Adjustments/Labs and Tests Ordered: Current medicines are reviewed at length with the patient today.  Concerns regarding medicines are outlined above.  No orders of the defined types were placed in this encounter.  No orders of the defined types were placed in this encounter.   There are no Patient Instructions on file for this visit.   Signed, Mayra Reel, NP  12/14/2021 8:16 PM    [place]

## 2021-12-15 ENCOUNTER — Ambulatory Visit: Payer: Medicare Other | Attending: Cardiology | Admitting: Cardiology

## 2021-12-15 ENCOUNTER — Encounter: Payer: Self-pay | Admitting: Cardiology

## 2021-12-19 ENCOUNTER — Other Ambulatory Visit (HOSPITAL_COMMUNITY): Payer: Self-pay

## 2021-12-20 NOTE — Progress Notes (Signed)
Went to the home but her husband was not feeling well.  Dropped of some depends to her, she uses a lot of them.  He said [employer] only gives her 2 packs of 14 a week.  They are having problems with the aid service, they are calling out sick or with problems all the time.  He has a neighbor across the street that is helping them out a lot, with groceries, house cleaning and anything they need.  Short visit, she is doing ok, able to get up and get around on her own.  She is getting more strength back.  Will continue to visit for heart failure, diet and medication management.   [place] [phone]

## 2021-12-22 ENCOUNTER — Other Ambulatory Visit: Payer: Medicare Other | Admitting: Primary Care

## 2022-01-11 ENCOUNTER — Ambulatory Visit: Payer: Medicare Other | Admitting: Family

## 2022-01-11 NOTE — Telephone Encounter (Signed)
error 

## 2022-01-29 ENCOUNTER — Other Ambulatory Visit: Payer: Self-pay

## 2022-01-29 ENCOUNTER — Encounter: Payer: Self-pay | Admitting: Internal Medicine

## 2022-01-29 ENCOUNTER — Emergency Department: Payer: Medicare Other

## 2022-01-29 ENCOUNTER — Inpatient Hospital Stay
Admission: EM | Admit: 2022-01-29 | Discharge: 2022-02-03 | DRG: 190 | Disposition: A | Discharge status: Skilled Nursing Facility | Payer: Medicare Other | Attending: Internal Medicine | Admitting: Internal Medicine

## 2022-01-29 DIAGNOSIS — I272 Pulmonary hypertension, unspecified: Secondary | ICD-10-CM | POA: Diagnosis present

## 2022-01-29 DIAGNOSIS — N3001 Acute cystitis with hematuria: Secondary | ICD-10-CM

## 2022-01-29 DIAGNOSIS — J441 Chronic obstructive pulmonary disease with (acute) exacerbation: Principal | ICD-10-CM | POA: Diagnosis present

## 2022-01-29 DIAGNOSIS — J90 Pleural effusion, not elsewhere classified: Secondary | ICD-10-CM | POA: Diagnosis present

## 2022-01-29 DIAGNOSIS — Z7901 Long term (current) use of anticoagulants: Secondary | ICD-10-CM

## 2022-01-29 DIAGNOSIS — R9431 Abnormal electrocardiogram [ECG] [EKG]: Secondary | ICD-10-CM | POA: Diagnosis present

## 2022-01-29 DIAGNOSIS — R001 Bradycardia, unspecified: Secondary | ICD-10-CM | POA: Diagnosis present

## 2022-01-29 DIAGNOSIS — L89316 Pressure-induced deep tissue damage of right buttock: Secondary | ICD-10-CM | POA: Diagnosis present

## 2022-01-29 DIAGNOSIS — D72829 Elevated white blood cell count, unspecified: Secondary | ICD-10-CM | POA: Diagnosis present

## 2022-01-29 DIAGNOSIS — G9341 Metabolic encephalopathy: Secondary | ICD-10-CM | POA: Diagnosis present

## 2022-01-29 DIAGNOSIS — R0603 Acute respiratory distress: Secondary | ICD-10-CM

## 2022-01-29 DIAGNOSIS — N183 Chronic kidney disease, stage 3 unspecified: Secondary | ICD-10-CM | POA: Diagnosis present

## 2022-01-29 DIAGNOSIS — R55 Syncope and collapse: Secondary | ICD-10-CM | POA: Diagnosis present

## 2022-01-29 DIAGNOSIS — J9620 Acute and chronic respiratory failure, unspecified whether with hypoxia or hypercapnia: Secondary | ICD-10-CM | POA: Diagnosis present

## 2022-01-29 DIAGNOSIS — J9621 Acute and chronic respiratory failure with hypoxia: Secondary | ICD-10-CM | POA: Diagnosis not present

## 2022-01-29 DIAGNOSIS — Z91013 Allergy to seafood: Secondary | ICD-10-CM

## 2022-01-29 DIAGNOSIS — I480 Paroxysmal atrial fibrillation: Secondary | ICD-10-CM | POA: Diagnosis present

## 2022-01-29 DIAGNOSIS — I5032 Chronic diastolic (congestive) heart failure: Secondary | ICD-10-CM | POA: Diagnosis present

## 2022-01-29 DIAGNOSIS — E669 Obesity, unspecified: Secondary | ICD-10-CM | POA: Diagnosis present

## 2022-01-29 DIAGNOSIS — N189 Chronic kidney disease, unspecified: Secondary | ICD-10-CM | POA: Diagnosis present

## 2022-01-29 DIAGNOSIS — I5033 Acute on chronic diastolic (congestive) heart failure: Secondary | ICD-10-CM | POA: Diagnosis present

## 2022-01-29 DIAGNOSIS — I350 Nonrheumatic aortic (valve) stenosis: Secondary | ICD-10-CM | POA: Diagnosis present

## 2022-01-29 DIAGNOSIS — B338 Other specified viral diseases: Secondary | ICD-10-CM

## 2022-01-29 DIAGNOSIS — Z9981 Dependence on supplemental oxygen: Secondary | ICD-10-CM

## 2022-01-29 DIAGNOSIS — I4821 Permanent atrial fibrillation: Secondary | ICD-10-CM | POA: Diagnosis present

## 2022-01-29 DIAGNOSIS — Z79899 Other long term (current) drug therapy: Secondary | ICD-10-CM

## 2022-01-29 DIAGNOSIS — N3281 Overactive bladder: Secondary | ICD-10-CM | POA: Diagnosis present

## 2022-01-29 DIAGNOSIS — N179 Acute kidney failure, unspecified: Secondary | ICD-10-CM | POA: Diagnosis present

## 2022-01-29 DIAGNOSIS — E1165 Type 2 diabetes mellitus with hyperglycemia: Secondary | ICD-10-CM

## 2022-01-29 DIAGNOSIS — N1831 Chronic kidney disease, stage 3a: Secondary | ICD-10-CM | POA: Diagnosis present

## 2022-01-29 DIAGNOSIS — E1122 Type 2 diabetes mellitus with diabetic chronic kidney disease: Secondary | ICD-10-CM | POA: Diagnosis present

## 2022-01-29 DIAGNOSIS — R0902 Hypoxemia: Principal | ICD-10-CM

## 2022-01-29 DIAGNOSIS — Z1152 Encounter for screening for COVID-19: Secondary | ICD-10-CM

## 2022-01-29 DIAGNOSIS — E871 Hypo-osmolality and hyponatremia: Secondary | ICD-10-CM | POA: Diagnosis present

## 2022-01-29 DIAGNOSIS — Z87891 Personal history of nicotine dependence: Secondary | ICD-10-CM

## 2022-01-29 DIAGNOSIS — L8990 Pressure ulcer of unspecified site, unspecified stage: Secondary | ICD-10-CM | POA: Insufficient documentation

## 2022-01-29 DIAGNOSIS — Z794 Long term (current) use of insulin: Secondary | ICD-10-CM

## 2022-01-29 DIAGNOSIS — D509 Iron deficiency anemia, unspecified: Secondary | ICD-10-CM | POA: Diagnosis present

## 2022-01-29 DIAGNOSIS — E785 Hyperlipidemia, unspecified: Secondary | ICD-10-CM | POA: Diagnosis present

## 2022-01-29 DIAGNOSIS — I130 Hypertensive heart and chronic kidney disease with heart failure and stage 1 through stage 4 chronic kidney disease, or unspecified chronic kidney disease: Secondary | ICD-10-CM | POA: Diagnosis present

## 2022-01-29 DIAGNOSIS — E1110 Type 2 diabetes mellitus with ketoacidosis without coma: Secondary | ICD-10-CM

## 2022-01-29 DIAGNOSIS — J9691 Respiratory failure, unspecified with hypoxia: Secondary | ICD-10-CM | POA: Diagnosis present

## 2022-01-29 DIAGNOSIS — G4733 Obstructive sleep apnea (adult) (pediatric): Secondary | ICD-10-CM | POA: Diagnosis present

## 2022-01-29 DIAGNOSIS — K219 Gastro-esophageal reflux disease without esophagitis: Secondary | ICD-10-CM | POA: Diagnosis present

## 2022-01-29 DIAGNOSIS — Z6831 Body mass index (BMI) 31.0-31.9, adult: Secondary | ICD-10-CM

## 2022-01-29 DIAGNOSIS — I10 Essential (primary) hypertension: Secondary | ICD-10-CM | POA: Diagnosis present

## 2022-01-29 DIAGNOSIS — T380X5A Adverse effect of glucocorticoids and synthetic analogues, initial encounter: Secondary | ICD-10-CM | POA: Diagnosis not present

## 2022-01-29 DIAGNOSIS — B974 Respiratory syncytial virus as the cause of diseases classified elsewhere: Secondary | ICD-10-CM | POA: Diagnosis present

## 2022-01-29 DIAGNOSIS — Z888 Allergy status to other drugs, medicaments and biological substances status: Secondary | ICD-10-CM

## 2022-01-29 HISTORY — DX: Pleural effusion, not elsewhere classified: J90

## 2022-01-29 HISTORY — DX: Angiodysplasia of colon without hemorrhage: K55.20

## 2022-01-29 LAB — CBC WITH DIFFERENTIAL/PLATELET
Abs Immature Granulocytes: 0.04 10*3/uL (ref 0.00–0.07)
Basophils Absolute: 0 10*3/uL (ref 0.0–0.1)
Basophils Relative: 0 %
Eosinophils Absolute: 0 10*3/uL (ref 0.0–0.5)
Eosinophils Relative: 0 %
HCT: 40.7 % (ref 36.0–46.0)
Hemoglobin: 13.2 g/dL (ref 12.0–15.0)
Immature Granulocytes: 0 %
Lymphocytes Relative: 19 %
Lymphs Abs: 2.3 10*3/uL (ref 0.7–4.0)
MCH: 27.8 pg (ref 26.0–34.0)
MCHC: 32.4 g/dL (ref 30.0–36.0)
MCV: 85.9 fL (ref 80.0–100.0)
Monocytes Absolute: 0.8 10*3/uL (ref 0.1–1.0)
Monocytes Relative: 7 %
Neutro Abs: 8.7 10*3/uL — ABNORMAL HIGH (ref 1.7–7.7)
Neutrophils Relative %: 74 %
Platelets: 182 10*3/uL (ref 150–400)
RBC: 4.74 MIL/uL (ref 3.87–5.11)
RDW: 14.7 % (ref 11.5–15.5)
WBC: 11.8 10*3/uL — ABNORMAL HIGH (ref 4.0–10.5)
nRBC: 0 % (ref 0.0–0.2)

## 2022-01-29 LAB — COMPREHENSIVE METABOLIC PANEL
ALT: 8 U/L (ref 0–44)
AST: 11 U/L — ABNORMAL LOW (ref 15–41)
Albumin: 3.1 g/dL — ABNORMAL LOW (ref 3.5–5.0)
Alkaline Phosphatase: 85 U/L (ref 38–126)
Anion gap: 12 (ref 5–15)
BUN: 30 mg/dL — ABNORMAL HIGH (ref 8–23)
CO2: 27 mmol/L (ref 22–32)
Calcium: 8.6 mg/dL — ABNORMAL LOW (ref 8.9–10.3)
Chloride: 94 mmol/L — ABNORMAL LOW (ref 98–111)
Creatinine, Ser: 1.24 mg/dL — ABNORMAL HIGH (ref 0.44–1.00)
GFR, Estimated: 45 mL/min — ABNORMAL LOW (ref >60)
Glucose, Bld: 327 mg/dL — ABNORMAL HIGH (ref 70–99)
Potassium: 4.1 mmol/L (ref 3.5–5.1)
Sodium: 133 mmol/L — ABNORMAL LOW (ref 135–145)
Total Bilirubin: 1.2 mg/dL (ref 0.3–1.2)
Total Protein: 6.7 g/dL (ref 6.5–8.1)

## 2022-01-29 LAB — LACTIC ACID, PLASMA
Lactic Acid, Venous: 1.1 mmol/L (ref 0.5–1.9)
Lactic Acid, Venous: 1.4 mmol/L (ref 0.5–1.9)

## 2022-01-29 LAB — BLOOD GAS, VENOUS
Acid-Base Excess: 2.7 mmol/L — ABNORMAL HIGH (ref 0.0–2.0)
Bicarbonate: 28.9 mmol/L — ABNORMAL HIGH (ref 20.0–28.0)
O2 Saturation: 95 %
Patient temperature: 37
pCO2, Ven: 50 mmHg (ref 44–60)
pH, Ven: 7.37 (ref 7.25–7.43)
pO2, Ven: 75 mmHg — ABNORMAL HIGH (ref 32–45)

## 2022-01-29 LAB — PROCALCITONIN: Procalcitonin: 0.21 ng/mL

## 2022-01-29 LAB — PROTIME-INR
INR: 1.2 (ref 0.8–1.2)
Prothrombin Time: 15.4 seconds — ABNORMAL HIGH (ref 11.4–15.2)

## 2022-01-29 LAB — RESP PANEL BY RT-PCR (FLU A&B, COVID) ARPGX2
Influenza A by PCR: NEGATIVE
Influenza B by PCR: NEGATIVE
SARS Coronavirus 2 by RT PCR: NEGATIVE

## 2022-01-29 LAB — TROPONIN I (HIGH SENSITIVITY): Troponin I (High Sensitivity): 24 ng/L — ABNORMAL HIGH (ref <18)

## 2022-01-29 LAB — APTT: aPTT: 37 seconds — ABNORMAL HIGH (ref 24–36)

## 2022-01-29 MED ORDER — IPRATROPIUM-ALBUTEROL 0.5-2.5 (3) MG/3ML IN SOLN
3.0000 mL | RESPIRATORY_TRACT | Status: AC
  Administered 2022-01-29 (×3): 3 mL via RESPIRATORY_TRACT
  Filled 2022-01-29: qty 6
  Filled 2022-01-29: qty 3

## 2022-01-29 NOTE — ED Notes (Signed)
Awaiting pt to urinate to collect sample for testing.

## 2022-01-29 NOTE — ED Provider Notes (Signed)
[facility] Provider Note    Event Date/Time   First MD Initiated Contact with Patient 01/29/22 2029     (approximate)   History   Shortness of Breath   HPI  Karen [name] is a 76 y.o. female with past medical history significant for COPD (baseline 3 L home oxygen), CHF, who presents to the emergency department shortness of breath.  Endorses 3 to 4 days of progressively worsening shortness of breath.  Attempted home albuterol treatments without significant improvement.  When EMS arrived patient was hypoxic to 85% on her 3 L nasal cannula and increased to 4 L.  Endorses a productive cough for the past 1 week.  Endorses a sharp chest pain.  Denies nausea vomiting or diarrhea.  No prior history of DVT or PE.  Endorses recent hospitalization.     Physical Exam      Most recent vital signs: Vitals:   01/29/22 2130 01/29/22 2200  BP: 109/68 122/63  Pulse: 99 (!) 105  Resp: (!) 33 (!) 36  Temp:    SpO2: 94% 94%    Physical Exam Constitutional:      Appearance: She is well-developed.  HENT:     Head: Atraumatic.  Eyes:     Conjunctiva/sclera: Conjunctivae normal.  Cardiovascular:     Rate and Rhythm: Regular rhythm.  Pulmonary:     Effort: Respiratory distress present.     Breath sounds: Wheezing present.     Comments: DuoNeb treatment, diffuse inspiratory and expiratory wheezing throughout all lung fields.  Coarse breath sounds.  Tachypneic.  Increased work of breathing.  Speaking in 2 word sentences. Abdominal:     General: There is no distension.     Tenderness: There is no abdominal tenderness.  Musculoskeletal:        General: Normal range of motion.     Cervical back: Normal range of motion.  Skin:    General: Skin is warm.  Neurological:     Mental Status: She is alert. Mental status is at baseline.          IMPRESSION / MDM / ASSESSMENT AND PLAN / ED COURSE  I reviewed the triage vital signs and the nursing  notes.  76 year old female presents to the emergency department with acute hypoxia and shortness of breath.  Patient has a history of COPD and is on chronic 3 L of home oxygen.  When EMS arrived hypoxic to 85% on her home 3 L.  Received DuoNeb treatments and IV Solu-Medrol 125 mg prior to arrival with EMS.  Differential diagnosis including pneumonia, COPD exacerbation, viral illness, COVID, CHF exacerbation, ACS    EKG  I, Nathaniel Man, the attending physician, personally viewed and interpreted this ECG.   Rate: Normal  Rhythm: Normal sinus  Axis: Normal  Intervals: Normal  ST&T Change: None  No tachycardic or bradycardic dysrhythmias while on cardiac telemetry.  RADIOLOGY I independently reviewed imaging, my interpretation of imaging: Chest x-ray No focal findings consistent with pneumonia.  Chest x-ray was read as vascular congestion without edema.     ED Results / Procedures / Treatments   Labs (all labs ordered are listed, but only abnormal results are displayed) Labs interpreted as -  Troponin mildly elevated at 24.   Labs Reviewed  COMPREHENSIVE METABOLIC PANEL - Abnormal; Notable for the following components:      Result Value   Sodium 133 (*)    Chloride 94 (*)    Glucose, Bld 327 (*)  BUN 30 (*)    Creatinine, Ser 1.24 (*)    Calcium 8.6 (*)    Albumin 3.1 (*)    AST 11 (*)    GFR, Estimated 45 (*)    All other components within normal limits  CBC WITH DIFFERENTIAL/PLATELET - Abnormal; Notable for the following components:   WBC 11.8 (*)    Neutro Abs 8.7 (*)    All other components within normal limits  PROTIME-INR - Abnormal; Notable for the following components:   Prothrombin Time 15.4 (*)    All other components within normal limits  APTT - Abnormal; Notable for the following components:   aPTT 37 (*)    All other components within normal limits  BLOOD GAS, VENOUS - Abnormal; Notable for the following components:   pO2, Ven 75 (*)     Bicarbonate 28.9 (*)    Acid-Base Excess 2.7 (*)    All other components within normal limits  TROPONIN I (HIGH SENSITIVITY) - Abnormal; Notable for the following components:   Troponin I (High Sensitivity) 24 (*)    All other components within normal limits  RESP PANEL BY RT-PCR (FLU A&B, COVID) ARPGX2  CULTURE, BLOOD (ROUTINE X 2)  CULTURE, BLOOD (ROUTINE X 2)  LACTIC ACID, PLASMA  LACTIC ACID, PLASMA  BRAIN NATRIURETIC PEPTIDE  URINALYSIS, ROUTINE W REFLEX MICROSCOPIC  TROPONIN I (HIGH SENSITIVITY)   10:47 PM Patient's daughter stated that the patient has had worsening altered mental status with generalized weakness and fatigue that was worse starting today.  He has recently been on a Z-Pak without significant improvement of her shortness of breath or cough.  Normal lactic acid.  Mild leukocytosis.  No obvious findings consistent with a pneumonia.  COVID and influenza testing are negative.  UA is currently pending.  Patient admitted to the hospitalist for acute hypoxic respiratory failure in the setting of COPD exacerbation    PROCEDURES:  Critical Care performed: No  Procedures  Patient's presentation is most consistent with acute presentation with potential threat to life or bodily function.   MEDICATIONS ORDERED IN ED: Medications  ipratropium-albuterol (DUONEB) 0.5-2.5 (3) MG/3ML nebulizer solution 3 mL (3 mLs Nebulization Given 01/29/22 2100)    FINAL CLINICAL IMPRESSION(S) / ED DIAGNOSES   Final diagnoses:  Hypoxia  COPD exacerbation ([place])  Respiratory distress     Rx / DC Orders   ED Discharge Orders     None        Note:  This document was prepared using Dragon voice recognition software and may include unintentional dictation errors.   Nathaniel Man, MD 01/29/22 [phone]

## 2022-01-29 NOTE — Progress Notes (Deleted)
Patient ID: Karen [name], female    DOB: January 20, 1946, 76 y.o.   MRN: [id number]  [name] is a 76 y/o female with a history of HTN, CKD, DM, asthma, hyperlipidemia, COPD, anemia and chronic heart failure.    Echo report from 08/25/21 reviewed and showed an EF of 60-65% along with moderately elevated PA pressure of 55.4 mmHg, moderate left lateral pleural effusion and moderate TR. Echo report from 02/06/21 reviewed and showed an EF of 60-65%, no LVR. RV mildly enlarged, severely elevated PA systolic pressure. Echo report from 02/14/2019 reviewed and showed an EF of 65-70% without LVH along with trivial MR/TR and moderately elevated PA pressure.   Admitted 11/28/21 due to syncopal episode and hypotension.IVF given. Antibiotics given for UTI. Potassium corrected. Losartan and HCTZ stopped. Discharged the following day. Admitted 08/14/21 due to left sided chest pain, found to be in AF RVR and acute on chroinc heart failure. Treated with antibiotics for pneumonia. Cardiology, pulmonology and palliative care consults obtained. Needed high flow oxygen/ bipap. Had nemerous thoracentesis done. Discharged after 24 days.        She presents today for a follow-up visit with a chief complaint of    Past Medical History:  Diagnosis Date   (HFpEF) heart failure with preserved ejection fraction (Karen [name])    a. 2017 Echo: EF 50%; b. 06/2018 Echo: EF 50-55%; c. 08/2018 Echo: EF 50-55%, Nl RV fxn; d. 09/2019 Echo: EF 55-60%, no rwma, mild LVH, Gr1 DD, nl RV size/fxn, PASP 63.21mmHg. Mildly dil LA. Triv MR. Mod AS (AoV 0.94cm^2 VTI; mean grad 17.31mmHg); d. 02/2021 Echo: EF 60-65%, no rwma, GrI DD, mildly red RV fxn, RVSP 64.25mmHg, mild-mod MR, mod-sev TR, mild AS.   Acute on chronic respiratory failure with hypoxia and hypercapnia (HCC) 01/07/2015   Anemia    Asterixis 01/07/2015   Asthma    Cataract    CKD (chronic kidney disease), stage III (HCC)    COPD (chronic obstructive pulmonary disease) ([name])    a. 06/2018  tobacco use, home 3L oxygen    Diabetes mellitus without complication ([name])    a. 09/2019 A1C 8.8   Edema, peripheral 04/20/2014   GI bleed 06/28/2019   History of kidney stones    Hyperlipidemia    Hypertension    Iron deficiency anemia 06/22/2014   Junctional bradycardia    a. In setting of beta blocker therapy.   Leucocytosis 10/19/2015   Moderate aortic stenosis    a.  09/2019 Echo: Mod AS (AoV 0.94cm^2 VTI; mean grad 17.38mmHg); b. 02/2021 Echo: Mild AS.   Morbid obesity ([name])    Overactive bladder    Primary osteoarthritis of right knee 09/01/2016   Sciatica 01/07/2015   Valvular heart disease    a. 02/2021 Echo: EF 60-65%, no rwma, GrI DD, mild-mod MR, mod-sev TR, mild AS   Past Surgical History:  Procedure Laterality Date   APPENDECTOMY     CESAREAN SECTION     x3   CHOLECYSTECTOMY     COLONOSCOPY WITH PROPOFOL N/A 08/28/2017   Procedure: COLONOSCOPY WITH PROPOFOL;  Surgeon: Lucilla Lame, MD;  Location: [facility] ENDOSCOPY;  Service: Endoscopy;  Laterality: N/A;   COLONOSCOPY WITH PROPOFOL N/A 08/29/2017   Procedure: COLONOSCOPY WITH PROPOFOL;  Surgeon: Lucilla Lame, MD;  Location: [facility] ENDOSCOPY;  Service: Endoscopy;  Laterality: N/A;   CYSTOSCOPY W/ URETERAL STENT PLACEMENT Right 09/15/2017   Procedure: CYSTOSCOPY WITH RETROGRADE PYELOGRAM/URETERAL STENT PLACEMENT;  Surgeon: Cleon Gustin, MD;  Location: [facility] ORS;  Service: Urology;  Laterality: Right;   CYSTOSCOPY/URETEROSCOPY/HOLMIUM LASER/STENT PLACEMENT Right 10/09/2017   Procedure: CYSTOSCOPY/URETEROSCOPY/HOLMIUM LASER/STENT PLACEMENT;  Surgeon: Abbie Sons, MD;  Location: [facility] ORS;  Service: Urology;  Laterality: Right;  right Stent exchange   ESOPHAGOGASTRODUODENOSCOPY (EGD) WITH PROPOFOL N/A 09/16/2018   Procedure: ESOPHAGOGASTRODUODENOSCOPY (EGD) WITH PROPOFOL;  Surgeon: Lin Landsman, MD;  Location: [place];  Service: Gastroenterology;  Laterality: N/A;   EYE SURGERY     IR THORACENTESIS ASP PLEURAL  SPACE W/IMG GUIDE  02/07/2021   Family History  Problem Relation Age of Onset   Other Mother        unknown medical history   Other Father        unknown medical history   Social History   Tobacco Use   Smoking status: Former    Packs/day: 1.00    Years: 20.00    Total pack years: 20.00    Types: Cigarettes    Quit date: 12/04/1992    Years since quitting: 29.1   Smokeless tobacco: Never  Substance Use Topics   Alcohol use: No   Allergies  Allergen Reactions   Ace Inhibitors Hives   Beta Adrenergic Blockers     Junctional bradycardia   Gabapentin Hives   Lisinopril Hives   Lyrica [Pregabalin] Hives   Shrimp [Shellfish Allergy] Swelling    Swelling of the lips     Review of Systems  Constitutional:  Positive for fatigue. Negative for appetite change.  HENT:  Negative for congestion, postnasal drip and sore throat.   Eyes: Negative.   Respiratory:  Positive for cough. Negative for choking and shortness of breath.   Cardiovascular:  Positive for chest pain ("little bit") and leg swelling (improving). Negative for palpitations.  Gastrointestinal:  Negative for abdominal distention and abdominal pain.  Endocrine: Negative.   Genitourinary: Negative.   Musculoskeletal:  Positive for arthralgias and back pain. Negative for neck pain.  Allergic/Immunologic: Negative.   Neurological:  Negative for dizziness, weakness and light-headedness.  Hematological:  Negative for adenopathy. Does not bruise/bleed easily.  Psychiatric/Behavioral:  Negative for dysphoric mood and sleep disturbance (sleeping on 1 pillow; sleeping with oxygen @ 3L). The patient is not nervous/anxious.      Physical Exam Vitals and nursing note reviewed. Exam conducted with a chaperone present.  Constitutional:      General: She is not in acute distress.    Appearance: Normal appearance. She is not toxic-appearing.  HENT:     Head: Normocephalic and atraumatic.  Cardiovascular:     Rate and Rhythm:  Regular rhythm. Bradycardia present.     Pulses: Normal pulses.     Heart sounds: No murmur heard. Pulmonary:     Effort: Pulmonary effort is normal. No respiratory distress.     Breath sounds: No wheezing or rales.  Abdominal:     General: There is no distension.     Palpations: Abdomen is soft.     Tenderness: There is no abdominal tenderness.  Musculoskeletal:        General: No tenderness.     Cervical back: Normal range of motion.     Right lower leg: Edema (trace pitting) present.     Left lower leg: Edema (trace pitting) present.  Skin:    General: Skin is warm and dry.  Neurological:     General: No focal deficit present.     Mental Status: She is alert and oriented to person, place, and time.  Psychiatric:  Mood and Affect: Mood normal.        Behavior: Behavior normal.   Assessment & Plan:  1: Chronic heart failure with preserved ejection fraction without structural changes- - NYHA class II - euvolemic today - order written for SNF to weigh daily and call for an overnight weight gain >2 pounds or a weekly weight gain of > 5 pounds  - saw cardiology Rockey Situ) 05/13/21 - participating in paramedicine program - not adding salt to her food - BNP 11/28/21 was 29.4  2: Hypertension- - BP - saw PCP Matilde Bash) 10/25/21 - BMP 11/29/21 reviewed and showed sodium 133, potassium 3.0, creatinine 0.94 & GFR >60  3: DM- - saw endocrinology Honor Junes) 04/14/21 - A1c 08/08/21 was 9.1% - glucose at facility today was   4: COPD/ PAH- - wearing oxygen at 3L around the clock - palliative care visit on 10/27/21 - still waiting to start bipap at facility

## 2022-01-29 NOTE — H&P (Signed)
History and Physical    Chief Complaint: SOB.   HISTORY OF PRESENT ILLNESS: Karen [name] is an 76 y.o. female  for sob and hypoxia and Resp failure.  Patient uses 3 L at home chronically for her COPD and has become progressively SOB with becoming hypoxic and increased oxygen requirement.  Patient has been treated with azithromycin and has completed therapy. Daughter increased to 4L EMS at 85%.  4.5 L currently Wheezing.  Lethargy today.  Patient reports chest pain with deep inspiration and coughing.  Cough is productive for the past week or so.  Review of systems otherwise negative patient gained weight patient continues any chest pain or palpitations abdominal pain nausea vomiting diarrhea bleeding.  Patient has a history of heart failure and pulmonary hypertension see below: Past Medical History:  Diagnosis Date   (HFpEF) heart failure with preserved ejection fraction ([place])    a. 2017 Echo: EF 50%; b. 06/2018 Echo: EF 50-55%; c. 08/2018 Echo: EF 50-55%, Nl RV fxn; d. 09/2019 Echo: EF 55-60%, no rwma, mild LVH, Gr1 DD, nl RV size/fxn, PASP 63.35mmHg. Mildly dil LA. Triv MR. Mod AS (AoV 0.94cm^2 VTI; mean grad 17.76mmHg); d. 02/2021 Echo: EF 60-65%, no rwma, GrI DD, mildly red RV fxn, RVSP 64.74mmHg, mild-mod MR, mod-sev TR, mild AS.   Acute on chronic respiratory failure with hypoxia and hypercapnia (HCC) 01/07/2015   Anemia    Asterixis 01/07/2015   Asthma    AVM (arteriovenous malformation) of small bowel, acquired    Cataract    CKD (chronic kidney disease), stage III (HCC)    COPD (chronic obstructive pulmonary disease) ([place])    a. 06/2018 tobacco use, home 3L oxygen    COPD with acute exacerbation ([place]) 09/28/2019   Diabetes mellitus without complication ([place])    a. 09/2019 A1C 8.8   Edema, peripheral 04/20/2014   GI bleed 06/28/2019   History of kidney stones    Hyperlipidemia    Hypertension    Iron deficiency anemia 06/22/2014   Junctional bradycardia    a. In setting of  beta blocker therapy.   Leucocytosis 10/19/2015   Moderate aortic stenosis    a.  09/2019 Echo: Mod AS (AoV 0.94cm^2 VTI; mean grad 17.6mmHg); b. 02/2021 Echo: Mild AS.   Morbid obesity (HCC)    Overactive bladder    Pleural effusion    Primary osteoarthritis of right knee 09/01/2016   Sciatica 01/07/2015   Symptomatic bradycardia 08/05/2018   Urge incontinence 11/12/2017   Valvular heart disease    a. 02/2021 Echo: EF 60-65%, no rwma, GrI DD, mild-mod MR, mod-sev TR, mild AS   Review of Systems  Unable to perform ROS: Mental status change  Respiratory:  Positive for cough and shortness of breath.   Cardiovascular:  Positive for leg swelling.  All other systems reviewed and are negative.   Allergies  Allergen Reactions   Ace Inhibitors Hives   Beta Adrenergic Blockers     Junctional bradycardia   Gabapentin Hives   Lisinopril Hives   Lyrica [Pregabalin] Hives   Shrimp [Shellfish Allergy] Swelling    Swelling of the lips   Past Surgical History:  Procedure Laterality Date   APPENDECTOMY     CESAREAN SECTION     x3   CHOLECYSTECTOMY     COLONOSCOPY WITH PROPOFOL N/A 08/28/2017   Procedure: COLONOSCOPY WITH PROPOFOL;  Surgeon: Lucilla Lame, MD;  Location: [facility] ENDOSCOPY;  Service: Endoscopy;  Laterality: N/A;   COLONOSCOPY WITH PROPOFOL N/A 08/29/2017  Procedure: COLONOSCOPY WITH PROPOFOL;  Surgeon: Lucilla Lame, MD;  Location: [facility] ENDOSCOPY;  Service: Endoscopy;  Laterality: N/A;   CYSTOSCOPY W/ URETERAL STENT PLACEMENT Right 09/15/2017   Procedure: CYSTOSCOPY WITH RETROGRADE PYELOGRAM/URETERAL STENT PLACEMENT;  Surgeon: Cleon Gustin, MD;  Location: [facility] ORS;  Service: Urology;  Laterality: Right;   CYSTOSCOPY/URETEROSCOPY/HOLMIUM LASER/STENT PLACEMENT Right 10/09/2017   Procedure: CYSTOSCOPY/URETEROSCOPY/HOLMIUM LASER/STENT PLACEMENT;  Surgeon: Abbie Sons, MD;  Location: [facility] ORS;  Service: Urology;  Laterality: Right;  right Stent exchange    ESOPHAGOGASTRODUODENOSCOPY (EGD) WITH PROPOFOL N/A 09/16/2018   Procedure: ESOPHAGOGASTRODUODENOSCOPY (EGD) WITH PROPOFOL;  Surgeon: Lin Landsman, MD;  Location: [facility];  Service: Gastroenterology;  Laterality: N/A;   EYE SURGERY     IR THORACENTESIS ASP PLEURAL SPACE W/IMG GUIDE  02/07/2021    Social History   Socioeconomic History   Marital status: Married    Spouse name: Not on file   Number of children: Not on file   Years of education: Not on file   Highest education level: Not on file  Occupational History   Not on file  Tobacco Use   Smoking status: Former    Packs/day: 1.00    Years: 20.00    Total pack years: 20.00    Types: Cigarettes    Quit date: 12/04/1992    Years since quitting: 29.1   Smokeless tobacco: Never  Vaping Use   Vaping Use: Never used  Substance and Sexual Activity   Alcohol use: No   Drug use: No   Sexual activity: Not Currently  Other Topics Concern   Not on file  Social History Narrative   Not on file   Social Determinants of Health   Financial Resource Strain: Not on file  Food Insecurity: No Food Insecurity (11/28/2021)   Hunger Vital Sign    Worried About Running Out of Food in the Last Year: Never true    Ran Out of Food in the Last Year: Never true  Transportation Needs: No Transportation Needs (11/28/2021)   PRAPARE - Hydrologist (Medical): No    Lack of Transportation (Non-Medical): No  Physical Activity: Not on file  Stress: Not on file  Social Connections: Not on file     CURRENT MEDS:  Current Facility-Administered Medications (Endocrine & Metabolic):    insulin aspart (novoLOG) injection 0-15 Units   methylPREDNISolone sodium succinate (SOLU-MEDROL) 125 mg/2 mL injection 60 mg **IN FOLLOWED-BY LINKED GROUP WITH** [START ON 01/31/2022] predniSONE (DELTASONE) tablet 40 mg   Current Facility-Administered Medications (Cardiovascular):    diltiazem (CARDIZEM) 125 mg in dextrose 5% 125  mL (1 mg/mL) infusion   hydrALAZINE (APRESOLINE) injection 5 mg  Current Outpatient Medications (Cardiovascular):    atorvastatin (LIPITOR) 10 MG tablet, Take 1 tablet by mouth daily.   diltiazem (CARDIZEM SR) 60 MG 12 hr capsule, Take 60 mg by mouth 2 (two) times daily.   spironolactone (ALDACTONE) 25 MG tablet, Take 1 tablet (25 mg total) by mouth daily.  Current Facility-Administered Medications (Respiratory):    albuterol (PROVENTIL) (2.5 MG/3ML) 0.083% nebulizer solution 2.5 mg   guaiFENesin (MUCINEX) 12 hr tablet 600 mg   ipratropium-albuterol (DUONEB) 0.5-2.5 (3) MG/3ML nebulizer solution 3 mL  Current Outpatient Medications (Respiratory):    albuterol (VENTOLIN HFA) 108 (90 Base) MCG/ACT inhaler, Inhale 2 puffs into the lungs every 6 (six) hours as needed for wheezing or shortness of breath.    fluticasone (FLONASE) 50 MCG/ACT nasal spray, Place 2 sprays into both nostrils  daily.   guaiFENesin (MUCINEX) 600 MG 12 hr tablet, Take 600 mg by mouth daily.   montelukast (SINGULAIR) 10 MG tablet, Take 10 mg by mouth at bedtime.  Current Facility-Administered Medications (Analgesics):    HYDROcodone-acetaminophen (NORCO/VICODIN) 5-325 MG per tablet 1-2 tablet   morphine (PF) 2 MG/ML injection 2 mg  Current Outpatient Medications (Analgesics):    acetaminophen (TYLENOL) 500 MG tablet, Take 1-2 tablets (500-1,000 mg total) by mouth every 6 (six) hours as needed for mild pain, fever, moderate pain or headache. Do not take more than 4 grams a day   allopurinol (ZYLOPRIM) 100 MG tablet, Take 1 tablet by mouth daily.   butalbital-acetaminophen-caffeine (FIORICET) 50-325-40 MG tablet, Take 1 tablet by mouth every 6 (six) hours as needed.   Current Outpatient Medications (Hematological):    apixaban (ELIQUIS) 5 MG TABS tablet, Take 1 tablet by mouth twice daily   Ferrous Sulfate (IRON) 325 (65 Fe) MG TABS, Take 1 tablet (325 mg total) by mouth daily.   vitamin B-12 (CYANOCOBALAMIN) 500 MCG  tablet, Take 500 mcg by mouth daily.  Current Facility-Administered Medications (Other):    bisacodyl (DULCOLAX) EC tablet 5 mg   cefTRIAXone (ROCEPHIN) 2 g in sodium chloride 0.9 % 100 mL IVPB   docusate sodium (COLACE) capsule 100 mg   lactated ringers infusion   lactated ringers infusion   polyethylene glycol (MIRALAX / GLYCOLAX) packet 17 g   sodium chloride flush (NS) 0.9 % injection 3 mL   zolpidem (AMBIEN) tablet 5 mg  Current Outpatient Medications (Other):    esomeprazole (NEXIUM) 40 MG capsule, Take 40 mg by mouth daily.   traZODone (DESYREL) 50 MG tablet, Take 1 tablet (50 mg total) by mouth at bedtime as needed for sleep.    ED Course: Pt in Ed alert awake oriented afebrile O2 sats of 94% on 5 L.  Venous blood gas shows PaO2 of 75 with normal pH and pCO2. Vitals:   01/29/22 2130 01/29/22 2200 01/29/22 2230 01/29/22 2300  BP: 109/68 122/63 (!) 121/53 (!) 132/57  Pulse: 99 (!) 105 100 (!) 105  Resp: (!) 33 (!) 36 (!) 26 (!) 33  Temp:      TempSrc:      SpO2: 94% 94% 93% 92%  Weight:      Height:       No intake/output data recorded. SpO2: 92 % O2 Flow Rate (L/min): 5 L/min Blood work in ed shows: Hyponatremia, CKD with a creatinine of 1.24 and GFR of 45, normal LFTs, troponin 24, BNP is pending.  White count of 11.8, 13.2 normal platelets, chest x-ray shows central vascular congestion without edema. Results for orders placed or performed during the hospital encounter of 01/29/22 (from the past 48 hour(s))  Lactic acid, plasma     Status: None   Collection Time: 01/29/22  9:02 PM  Result Value Ref Range   Lactic Acid, Venous 1.1 0.5 - 1.9 mmol/L    Comment: Performed at [facility], [address] [place], [place] [address]  Comprehensive metabolic panel     Status: Abnormal   Collection Time: 01/29/22  9:02 PM  Result Value Ref Range   Sodium 133 (L) 135 - 145 mmol/L   Potassium 4.1 3.5 - 5.1 mmol/L    Comment: HEMOLYSIS AT THIS LEVEL MAY AFFECT  RESULT   Chloride 94 (L) 98 - 111 mmol/L   CO2 27 22 - 32 mmol/L   Glucose, Bld 327 (H) 70 - 99 mg/dL  Comment: Glucose reference range applies only to samples taken after fasting for at least 8 hours.   BUN 30 (H) 8 - 23 mg/dL   Creatinine, Ser 1.24 (H) 0.44 - 1.00 mg/dL   Calcium 8.6 (L) 8.9 - 10.3 mg/dL   Total Protein 6.7 6.5 - 8.1 g/dL   Albumin 3.1 (L) 3.5 - 5.0 g/dL   AST 11 (L) 15 - 41 U/L   ALT 8 0 - 44 U/L   Alkaline Phosphatase 85 38 - 126 U/L   Total Bilirubin 1.2 0.3 - 1.2 mg/dL   GFR, Estimated 45 (L) >60 mL/min    Comment: (NOTE) Calculated using the CKD-EPI Creatinine Equation (2021)    Anion gap 12 5 - 15    Comment: Performed at [facility], [address] [place], [place] [address]  CBC with Differential     Status: Abnormal   Collection Time: 01/29/22  9:02 PM  Result Value Ref Range   WBC 11.8 (H) 4.0 - 10.5 K/uL   RBC 4.74 3.87 - 5.11 MIL/uL   Hemoglobin 13.2 12.0 - 15.0 g/dL   HCT 40.7 36.0 - 46.0 %   MCV 85.9 80.0 - 100.0 fL   MCH 27.8 26.0 - 34.0 pg   MCHC 32.4 30.0 - 36.0 g/dL   RDW 14.7 11.5 - 15.5 %   Platelets 182 150 - 400 K/uL   nRBC 0.0 0.0 - 0.2 %   Neutrophils Relative % 74 %   Neutro Abs 8.7 (H) 1.7 - 7.7 K/uL   Lymphocytes Relative 19 %   Lymphs Abs 2.3 0.7 - 4.0 K/uL   Monocytes Relative 7 %   Monocytes Absolute 0.8 0.1 - 1.0 K/uL   Eosinophils Relative 0 %   Eosinophils Absolute 0.0 0.0 - 0.5 K/uL   Basophils Relative 0 %   Basophils Absolute 0.0 0.0 - 0.1 K/uL   Immature Granulocytes 0 %   Abs Immature Granulocytes 0.04 0.00 - 0.07 K/uL    Comment: Performed at [facility], [address] [place], [place] [address]  Protime-INR     Status: Abnormal   Collection Time: 01/29/22  9:02 PM  Result Value Ref Range   Prothrombin Time 15.4 (H) 11.4 - 15.2 seconds   INR 1.2 0.8 - 1.2    Comment: (NOTE) INR goal varies based on device and disease states. Performed at [facility], [address] [place], [place] [address]   APTT     Status: Abnormal   Collection Time: 01/29/22  9:02 PM  Result Value Ref Range   aPTT 37 (H) 24 - 36 seconds    Comment:        IF BASELINE aPTT IS ELEVATED, SUGGEST PATIENT RISK ASSESSMENT BE USED TO DETERMINE APPROPRIATE ANTICOAGULANT THERAPY. Performed at [facility], [address] [place], [place] [address]   Troponin I (High Sensitivity)     Status: Abnormal   Collection Time: 01/29/22  9:02 PM  Result Value Ref Range   Troponin I (High Sensitivity) 24 (H) <18 ng/L    Comment: (NOTE) Elevated high sensitivity troponin I (hsTnI) values and significant  changes across serial measurements may suggest ACS but many other  chronic and acute conditions are known to elevate hsTnI results.  Refer to the "Links" section for chest pain algorithms and additional  guidance. Performed at [facility], [address] [place], [place] [address]   Procalcitonin - Baseline     Status: None  Collection Time: 01/29/22  9:02 PM  Result Value Ref Range   Procalcitonin 0.21 ng/mL    Comment:        Interpretation: PCT (Procalcitonin) <= 0.5 ng/mL: Systemic infection (sepsis) is not likely. Local bacterial infection is possible. (NOTE)       Sepsis PCT Algorithm           Lower Respiratory Tract                                      Infection PCT Algorithm    ----------------------------     ----------------------------         PCT < 0.25 ng/mL                PCT < 0.10 ng/mL          Strongly encourage             Strongly discourage   discontinuation of antibiotics    initiation of antibiotics    ----------------------------     -----------------------------       PCT 0.25 - 0.50 ng/mL            PCT 0.10 - 0.25 ng/mL               OR       >80% decrease in PCT            Discourage initiation of                                            antibiotics      Encourage discontinuation           of antibiotics     ----------------------------     -----------------------------         PCT >= 0.50 ng/mL              PCT 0.26 - 0.50 ng/mL               AND        <80% decrease in PCT             Encourage initiation of                                             antibiotics       Encourage continuation           of antibiotics    ----------------------------     -----------------------------        PCT >= 0.50 ng/mL                  PCT > 0.50 ng/mL               AND         increase in PCT                  Strongly encourage                                      initiation of antibiotics    Strongly encourage escalation  of antibiotics                                     -----------------------------                                           PCT <= 0.25 ng/mL                                                 OR                                        > 80% decrease in PCT                                      Discontinue / Do not initiate                                             antibiotics  Performed at [facility], [address] [place], [place] [address]   Blood gas, venous     Status: Abnormal   Collection Time: 01/29/22  9:18 PM  Result Value Ref Range   pH, Ven 7.37 7.25 - 7.43   pCO2, Ven 50 44 - 60 mmHg   pO2, Ven 75 (H) 32 - 45 mmHg   Bicarbonate 28.9 (H) 20.0 - 28.0 mmol/L   Acid-Base Excess 2.7 (H) 0.0 - 2.0 mmol/L   O2 Saturation 95 %   Patient temperature 37.0    Collection site VEIN    Drawn by VENOUS     Comment: Performed at [facility], [address] [place], [place] [address]  Resp Panel by RT-PCR (Flu A&B, Covid) Anterior Nasal Swab     Status: None   Collection Time: 01/29/22  9:18 PM   Specimen: Anterior Nasal Swab  Result Value Ref Range   SARS Coronavirus 2 by RT PCR NEGATIVE NEGATIVE    Comment: (NOTE) SARS-CoV-2 target nucleic acids are NOT DETECTED.  The SARS-CoV-2 RNA is generally detectable in upper respiratory specimens  during the acute phase of infection. The lowest concentration of SARS-CoV-2 viral copies this assay can detect is 138 copies/mL. A negative result does not preclude SARS-Cov-2 infection and should not be used as the sole basis for treatment or other patient management decisions. A negative result may occur with  improper specimen collection/handling, submission of specimen other than nasopharyngeal swab, presence of viral mutation(s) within the areas targeted by this assay, and inadequate number of viral copies(<138 copies/mL). A negative result must be combined with clinical observations, patient history, and epidemiological information. The expected result is Negative.  Fact Sheet for Patients:  EntrepreneurPulse.com.au  Fact Sheet for Healthcare Providers:  IncredibleEmployment.be  This test is no t yet approved or cleared by the [place] FDA and  has been authorized for detection and/or diagnosis of SARS-CoV-2 by FDA under  an Emergency Use Authorization (EUA). This EUA will remain  in effect (meaning this test can be used) for the duration of the COVID-19 declaration under Section 564(b)(1) of the Act, 21 U.S.C.section 360bbb-3(b)(1), unless the authorization is terminated  or revoked sooner.       Influenza A by PCR NEGATIVE NEGATIVE   Influenza B by PCR NEGATIVE NEGATIVE    Comment: (NOTE) The Xpert Xpress SARS-CoV-2/FLU/RSV plus assay is intended as an aid in the diagnosis of influenza from Nasopharyngeal swab specimens and should not be used as a sole basis for treatment. Nasal washings and aspirates are unacceptable for Xpert Xpress SARS-CoV-2/FLU/RSV testing.  Fact Sheet for Patients: EntrepreneurPulse.com.au  Fact Sheet for Healthcare Providers: IncredibleEmployment.be  This test is not yet approved or cleared by the [place] FDA and has been authorized for detection and/or diagnosis  of SARS-CoV-2 by FDA under an Emergency Use Authorization (EUA). This EUA will remain in effect (meaning this test can be used) for the duration of the COVID-19 declaration under Section 564(b)(1) of the Act, 21 U.S.C. section 360bbb-3(b)(1), unless the authorization is terminated or revoked.  Performed at [facility], [address] [place], [place] [address]   Lactic acid, plasma     Status: None   Collection Time: 01/29/22 10:30 PM  Result Value Ref Range   Lactic Acid, Venous 1.4 0.5 - 1.9 mmol/L    Comment: Performed at [facility], [address] [place], [place] [address]  Brain natriuretic peptide     Status: None   Collection Time: 01/29/22 11:00 PM  Result Value Ref Range   B Natriuretic Peptide 70.9 0.0 - 100.0 pg/mL    Comment: Performed at [facility], [address] [place], [place] [address]  Troponin I (High Sensitivity)     Status: Abnormal   Collection Time: 01/29/22 11:00 PM  Result Value Ref Range   Troponin I (High Sensitivity) 20 (H) <18 ng/L    Comment: (NOTE) Elevated high sensitivity troponin I (hsTnI) values and significant  changes across serial measurements may suggest ACS but many other  chronic and acute conditions are known to elevate hsTnI results.  Refer to the "Links" section for chest pain algorithms and additional  guidance. Performed at [facility], [address] [place], [place] [address]   Urinalysis, Complete w Microscopic     Status: Abnormal   Collection Time: 01/29/22 11:39 PM  Result Value Ref Range   Color, Urine YELLOW (A) YELLOW   APPearance TURBID (A) CLEAR   Specific Gravity, Urine 1.021 1.005 - 1.030   pH 5.0 5.0 - 8.0   Glucose, UA >=500 (A) NEGATIVE mg/dL   Hgb urine dipstick SMALL (A) NEGATIVE   Bilirubin Urine NEGATIVE NEGATIVE   Ketones, ur 5 (A) NEGATIVE mg/dL   Protein, ur 100 (A) NEGATIVE mg/dL   Nitrite NEGATIVE NEGATIVE   Leukocytes,Ua MODERATE (A) NEGATIVE    WBC, UA >50 (H) 0 - 5 WBC/hpf   Bacteria, UA MANY (A) NONE SEEN   Squamous Epithelial / LPF NONE SEEN 0 - 5   WBC Clumps PRESENT    Mucus PRESENT    Budding Yeast PRESENT     Comment: Performed at [facility], [address] [place], [place] [address]    In Ed pt received  Meds ordered this encounter  Medications   ipratropium-albuterol (DUONEB) 0.5-2.5 (3) MG/3ML nebulizer solution 3 mL   FOLLOWED BY Linked Order Group    methylPREDNISolone sodium succinate (  SOLU-MEDROL) 125 mg/2 mL injection 60 mg     IV methylprednisolone will be converted to either a q12h or q24h frequency with the same total daily dose (TDD).  Ordered Dose: 1 to 125 mg TDD; convert to: TDD q24h.  Ordered Dose: 126 to 250 mg TDD; convert to: TDD div q12h.  Ordered Dose: >250 mg TDD; DAW.    predniSONE (DELTASONE) tablet 40 mg   sodium chloride flush (NS) 0.9 % injection 3 mL   lactated ringers infusion   HYDROcodone-acetaminophen (NORCO/VICODIN) 5-325 MG per tablet 1-2 tablet   morphine (PF) 2 MG/ML injection 2 mg   zolpidem (AMBIEN) tablet 5 mg   docusate sodium (COLACE) capsule 100 mg   polyethylene glycol (MIRALAX / GLYCOLAX) packet 17 g   bisacodyl (DULCOLAX) EC tablet 5 mg   guaiFENesin (MUCINEX) 12 hr tablet 600 mg   hydrALAZINE (APRESOLINE) injection 5 mg   ipratropium-albuterol (DUONEB) 0.5-2.5 (3) MG/3ML nebulizer solution 3 mL   albuterol (PROVENTIL) (2.5 MG/3ML) 0.083% nebulizer solution 2.5 mg   cefTRIAXone (ROCEPHIN) 2 g in sodium chloride 0.9 % 100 mL IVPB    Order Specific Question:   Antibiotic Indication:    Answer:   UTI   lactated ringers infusion   diltiazem (CARDIZEM) 125 mg in dextrose 5% 125 mL (1 mg/mL) infusion   insulin aspart (novoLOG) injection 0-15 Units    Order Specific Question:   Correction coverage:    Answer:   Moderate (average weight, post-op)    Order Specific Question:   CBG < 70:    Answer:   implement hypoglycemia protocol    Order Specific Question:    CBG 70 - 120:    Answer:   0 units    Order Specific Question:   CBG 121 - 150:    Answer:   2 units    Order Specific Question:   CBG 151 - 200:    Answer:   3 units    Order Specific Question:   CBG 201 - 250:    Answer:   5 units    Order Specific Question:   CBG 251 - 300:    Answer:   8 units    Order Specific Question:   CBG 301 - 350:    Answer:   11 units    Order Specific Question:   CBG 351 - 400:    Answer:   15 units    Order Specific Question:   CBG > 400    Answer:   call MD and obtain STAT lab verification    Unresulted Labs (From admission, onward)     Start     Ordered   01/30/22 0500  Comprehensive metabolic panel  Tomorrow morning,   STAT        01/30/22 0102   01/30/22 0500  CBC  Tomorrow morning,   STAT        01/30/22 0102   01/30/22 0103  Respiratory (~20 pathogens) panel by PCR  (COPD / Pneumonia / Cellulitis / Lower Extremity Wound)  Add-on,   AD        01/30/22 0102   01/30/22 0034  Lipase, blood  Once,   STAT        01/30/22 0034   01/30/22 0032  CK  Once,   URGENT        01/30/22 0034   01/29/22 2339  Blood gas, venous  Once,   STAT        01/29/22 2338  01/29/22 2339  Brain natriuretic peptide  ONCE - STAT,   URGENT        01/29/22 2338   01/29/22 2031  Blood Culture (routine x 2)  (Undifferentiated presentation (screening labs and basic nursing orders))  BLOOD CULTURE X 2,   STAT      01/29/22 2030           Admission Imaging : CT ABDOMEN PELVIS WO CONTRAST  Result Date: 01/30/2022 CLINICAL DATA:  Acute abdominal pain EXAM: CT ABDOMEN AND PELVIS WITHOUT CONTRAST TECHNIQUE: Multidetector CT imaging of the abdomen and pelvis was performed following the standard protocol without IV contrast. RADIATION DOSE REDUCTION: This exam was performed according to the departmental dose-optimization program which includes automated exposure control, adjustment of the mA and/or kV according to patient size and/or use of iterative reconstruction technique.  COMPARISON:  10/11/2020 FINDINGS: Lower chest: Lung bases demonstrate left lower lobe atelectatic change with some central areas of increased density which may be related to prior aspiration. Hepatobiliary: No focal liver abnormality is seen. Status post cholecystectomy. No biliary dilatation. Pancreas: Unremarkable. No pancreatic ductal dilatation or surrounding inflammatory changes. Spleen: Normal in size without focal abnormality. Adrenals/Urinary Tract: Adrenal glands are within normal limits. Bladder is decompressed. Kidneys demonstrate no renal calculi or obstructive changes. Stomach/Bowel: Scattered diverticular change of the colon is noted without evidence of diverticulitis. The appendix has been surgically removed. No obstructive changes of the colon are noted. Small bowel and stomach are within normal limits. Vascular/Lymphatic: Aortic atherosclerosis. No enlarged abdominal or pelvic lymph nodes. Reproductive: Uterus and bilateral adnexa are unremarkable. Other: No abdominal wall hernia or abnormality. No abdominopelvic ascites. Musculoskeletal: Degenerative changes of lumbar spine are seen with anterolisthesis of L4 on L5. No acute bony abnormality is noted. IMPRESSION: Diverticulosis without diverticulitis. Left basilar atelectasis with findings suspicious for previous aspiration. No other focal abnormality is noted. Electronically Signed   By: Inez Catalina M.D.   On: 01/30/2022 00:53   CT HEAD WO CONTRAST (5MM)  Result Date: 01/30/2022 CLINICAL DATA:  Altered mental status EXAM: CT HEAD WITHOUT CONTRAST TECHNIQUE: Contiguous axial images were obtained from the base of the skull through the vertex without intravenous contrast. RADIATION DOSE REDUCTION: This exam was performed according to the departmental dose-optimization program which includes automated exposure control, adjustment of the mA and/or kV according to patient size and/or use of iterative reconstruction technique. COMPARISON:   11/28/2021 FINDINGS: Brain: No evidence of acute infarction, hemorrhage, hydrocephalus, extra-axial collection or mass lesion/mass effect. Chronic atrophic and ischemic changes are noted. Vascular: No hyperdense vessel or unexpected calcification. Skull: Normal. Negative for fracture or focal lesion. Sinuses/Orbits: Mucosal thickening is noted within the ethmoid, sphenoid and maxillary sinuses. Other: None. IMPRESSION: Chronic atrophic and ischemic changes without acute abnormality. Mucosal thickening within the paranasal sinuses new from the prior exam. Electronically Signed   By: Inez Catalina M.D.   On: 01/30/2022 00:48   DG Chest Port 1 View  Result Date: 01/29/2022 CLINICAL DATA:  Shortness of breath EXAM: PORTABLE CHEST 1 VIEW COMPARISON:  11/28/2021 FINDINGS: Cardiac shadow is stable. Aortic calcifications are again seen. Mild central vascular congestion is noted without edema. No focal confluent infiltrate is seen. No bony abnormality is noted. IMPRESSION: Central vascular congestion without edema. Electronically Signed   By: Inez Catalina M.D.   On: 01/29/2022 20:55    Physical Examination: Vitals:   01/29/22 2130 01/29/22 2200 01/29/22 2230 01/29/22 2300  BP: 109/68 122/63 (!) 121/53 (!) 132/57  Pulse: 99 Marland Kitchen)  105 100 (!) 105  Temp:      Resp: (!) 33 (!) 36 (!) 26 (!) 33  Height:      Weight:      SpO2: 94% 94% 93% 92%  TempSrc:      BMI (Calculated):       Physical Exam Vitals and nursing note reviewed.  Constitutional:      General: She is not in acute distress.    Appearance: Normal appearance. She is not ill-appearing, toxic-appearing or diaphoretic.  HENT:     Head: Normocephalic and atraumatic.     Right Ear: Hearing and external ear normal.     Left Ear: Hearing and external ear normal.     Nose: Nose normal. No nasal deformity.     Mouth/Throat:     Lips: Pink.     Mouth: Mucous membranes are moist.     Tongue: No lesions.     Pharynx: Oropharynx is clear.  Eyes:      Extraocular Movements: Extraocular movements intact.     Pupils: Pupils are equal, round, and reactive to light.  Cardiovascular:     Rate and Rhythm: Normal rate and regular rhythm.     Pulses: Normal pulses.     Heart sounds: Normal heart sounds.  Pulmonary:     Effort: Tachypnea present.     Breath sounds: No stridor, decreased air movement or transmitted upper airway sounds. Wheezing present.  Abdominal:     General: Bowel sounds are normal. There is no distension.     Palpations: Abdomen is soft. There is no mass.     Tenderness: There is no abdominal tenderness. There is no guarding.     Hernia: No hernia is present.  Musculoskeletal:     Right lower leg: No edema.     Left lower leg: No edema.  Skin:    General: Skin is warm.  Neurological:     Mental Status: She is lethargic.     GCS: GCS eye subscore is 3. GCS verbal subscore is 1. GCS motor subscore is 5.     Cranial Nerves: No cranial nerve deficit or facial asymmetry.      Assessment and Plan: * Acute on chronic respiratory failure with hypoxia (HCC)  Latest Reference Range & Units 01/29/22 21:18  pH, Ven 7.25 - 7.43  7.37  pCO2, Ven 44 - 60 mmHg 50  pO2, Ven 32 - 45 mmHg 75 (H)  Acid-Base Excess 0.0 - 2.0 mmol/L 2.7 (H)  Bicarbonate 20.0 - 28.0 mmol/L 28.9 (H)  O2 Saturation % 95  Patient temperature  37.0  Collection site  VEIN  (H): Data is abnormally high  Pt coming with lethargy and hypoxia. We will cont with o2 supplementation and follow.  Repeat VBP is pending.    UTI (urinary tract infection) Started rocephin.  AMS (altered mental status) Repeat VBG. Treat UTI. Stat head ct.  NPO. Aspiration and fall precaution.   OSA (obstructive sleep apnea) CPAP per RT.   Acute kidney injury superimposed on CKD [facility]) Lab Results  Component Value Date   CREATININE 1.24 (H) 01/29/2022   CREATININE 0.94 11/29/2021   CREATININE 1.25 (H) 11/28/2021  We will follow levels. Avoid meds and contrast  and renally dose needed meds.  Treat UTI. Urinalysis    Component Value Date/Time   COLORURINE YELLOW (A) 01/29/2022 2339   APPEARANCEUR TURBID (A) 01/29/2022 2339   APPEARANCEUR Cloudy (A) 09/28/2017 1338   LABSPEC 1.021 01/29/2022 2339   LABSPEC  1.024 04/02/2014 0517   PHURINE 5.0 01/29/2022 2339   GLUCOSEU >=500 (A) 01/29/2022 2339   GLUCOSEU >=500 04/02/2014 0517   HGBUR SMALL (A) 01/29/2022 2339   BILIRUBINUR NEGATIVE 01/29/2022 2339   BILIRUBINUR Negative 09/28/2017 1338   BILIRUBINUR Negative 04/02/2014 0517   KETONESUR 5 (A) 01/29/2022 2339   PROTEINUR 100 (A) 01/29/2022 2339   NITRITE NEGATIVE 01/29/2022 2339   LEUKOCYTESUR MODERATE (A) 01/29/2022 2339   LEUKOCYTESUR Trace 04/02/2014 0517     Permanent atrial fibrillation ([place]) Will continue patient on IV diltiazem. EKG today : pending.  Regular on exam.    Uncontrolled type 2 diabetes mellitus with hyperglycemia, with long-term current use of insulin (HCC) Glycemic protocol with accucheck q6 hourly.   Gastroesophageal reflux disease without esophagitis IV PPI therapy.   Iron deficiency anemia    Latest Ref Rng & Units 01/29/2022    9:02 PM 11/29/2021    4:06 AM 11/28/2021    1:15 PM  CBC  WBC 4.0 - 10.5 K/uL 11.8  11.5  13.6   Hemoglobin 12.0 - 15.0 g/dL 13.2  11.9  11.9   Hematocrit 36.0 - 46.0 % 40.7  35.9  36.9   Platelets 150 - 400 K/uL 182  271  276   Hemoglobin is currently 13.2 which I suspect is erroneous secondary to dehydration. Will hydrate and follow. Type and screen. IV PPI therapy.    Hypertension Vitals:   01/29/22 2035 01/29/22 2100 01/29/22 2130 01/29/22 2200  BP: (!) 117/55 (!) 100/48 109/68 122/63   01/29/22 2230 01/29/22 2300  BP: (!) 121/53 (!) 132/57  Patient, due to mental status change and a GCS of 9, stat head CT.    DVT prophylaxis:  Eliquis    Code Status:  Full Code    Family Communication:  Daina, Cara (Spouse) [phone]     Disposition Plan:  Home     Consults called:  None   Admission status: Inpatient    Unit/ Expected LOS: Stepdown/    Para Skeans MD [facility]  6 PM- 2 AM. Please contact me via secure Chat 6 PM-2 AM. [phone] ( Pager ) To contact the [facility] Attending or Consulting provider [place] or covering provider during after hours [place], for this patient.   Check the care team in [facility] and look for a) attending/consulting TR[facility] provider listed and b) the [facility] team listed Log into [email or web address] and use [place]'s universal password to access. If you do not have the password, please contact the hospital operator. Locate the [facility] provider you are looking for under [facility] and page to a number that you can be directly reached. If you still have difficulty reaching the provider, please page the [facility] (Director on Call) for the Hospitalists listed on amion for assistance. [email or web address] 01/30/2022, 1:02 AM

## 2022-01-30 ENCOUNTER — Emergency Department: Payer: Medicare Other

## 2022-01-30 ENCOUNTER — Ambulatory Visit: Payer: Medicare Other | Admitting: Family

## 2022-01-30 ENCOUNTER — Encounter: Payer: Self-pay | Admitting: Internal Medicine

## 2022-01-30 DIAGNOSIS — I4821 Permanent atrial fibrillation: Secondary | ICD-10-CM | POA: Diagnosis present

## 2022-01-30 DIAGNOSIS — G9341 Metabolic encephalopathy: Secondary | ICD-10-CM | POA: Diagnosis present

## 2022-01-30 DIAGNOSIS — E1122 Type 2 diabetes mellitus with diabetic chronic kidney disease: Secondary | ICD-10-CM | POA: Diagnosis present

## 2022-01-30 DIAGNOSIS — B338 Other specified viral diseases: Secondary | ICD-10-CM | POA: Diagnosis not present

## 2022-01-30 DIAGNOSIS — R0603 Acute respiratory distress: Secondary | ICD-10-CM | POA: Diagnosis present

## 2022-01-30 DIAGNOSIS — N179 Acute kidney failure, unspecified: Secondary | ICD-10-CM | POA: Diagnosis present

## 2022-01-30 DIAGNOSIS — E785 Hyperlipidemia, unspecified: Secondary | ICD-10-CM | POA: Diagnosis present

## 2022-01-30 DIAGNOSIS — I272 Pulmonary hypertension, unspecified: Secondary | ICD-10-CM | POA: Diagnosis present

## 2022-01-30 DIAGNOSIS — N189 Chronic kidney disease, unspecified: Secondary | ICD-10-CM

## 2022-01-30 DIAGNOSIS — G4733 Obstructive sleep apnea (adult) (pediatric): Secondary | ICD-10-CM

## 2022-01-30 DIAGNOSIS — Z1152 Encounter for screening for COVID-19: Secondary | ICD-10-CM | POA: Diagnosis not present

## 2022-01-30 DIAGNOSIS — I480 Paroxysmal atrial fibrillation: Secondary | ICD-10-CM | POA: Diagnosis not present

## 2022-01-30 DIAGNOSIS — T380X5A Adverse effect of glucocorticoids and synthetic analogues, initial encounter: Secondary | ICD-10-CM | POA: Diagnosis not present

## 2022-01-30 DIAGNOSIS — J9621 Acute and chronic respiratory failure with hypoxia: Secondary | ICD-10-CM | POA: Diagnosis present

## 2022-01-30 DIAGNOSIS — J9611 Chronic respiratory failure with hypoxia: Secondary | ICD-10-CM | POA: Diagnosis not present

## 2022-01-30 DIAGNOSIS — E871 Hypo-osmolality and hyponatremia: Secondary | ICD-10-CM | POA: Diagnosis present

## 2022-01-30 DIAGNOSIS — B974 Respiratory syncytial virus as the cause of diseases classified elsewhere: Secondary | ICD-10-CM | POA: Diagnosis present

## 2022-01-30 DIAGNOSIS — L89316 Pressure-induced deep tissue damage of right buttock: Secondary | ICD-10-CM | POA: Diagnosis present

## 2022-01-30 DIAGNOSIS — I130 Hypertensive heart and chronic kidney disease with heart failure and stage 1 through stage 4 chronic kidney disease, or unspecified chronic kidney disease: Secondary | ICD-10-CM | POA: Diagnosis present

## 2022-01-30 DIAGNOSIS — E1110 Type 2 diabetes mellitus with ketoacidosis without coma: Secondary | ICD-10-CM | POA: Diagnosis not present

## 2022-01-30 DIAGNOSIS — I5032 Chronic diastolic (congestive) heart failure: Secondary | ICD-10-CM

## 2022-01-30 DIAGNOSIS — N3001 Acute cystitis with hematuria: Secondary | ICD-10-CM | POA: Diagnosis present

## 2022-01-30 DIAGNOSIS — Z794 Long term (current) use of insulin: Secondary | ICD-10-CM | POA: Diagnosis not present

## 2022-01-30 DIAGNOSIS — J441 Chronic obstructive pulmonary disease with (acute) exacerbation: Secondary | ICD-10-CM | POA: Diagnosis present

## 2022-01-30 DIAGNOSIS — N1831 Chronic kidney disease, stage 3a: Secondary | ICD-10-CM | POA: Diagnosis present

## 2022-01-30 DIAGNOSIS — E669 Obesity, unspecified: Secondary | ICD-10-CM | POA: Diagnosis present

## 2022-01-30 DIAGNOSIS — I10 Essential (primary) hypertension: Secondary | ICD-10-CM

## 2022-01-30 DIAGNOSIS — J9691 Respiratory failure, unspecified with hypoxia: Secondary | ICD-10-CM | POA: Diagnosis present

## 2022-01-30 DIAGNOSIS — Z9981 Dependence on supplemental oxygen: Secondary | ICD-10-CM | POA: Diagnosis not present

## 2022-01-30 DIAGNOSIS — I350 Nonrheumatic aortic (valve) stenosis: Secondary | ICD-10-CM | POA: Diagnosis present

## 2022-01-30 LAB — BASIC METABOLIC PANEL
Anion gap: 15 (ref 5–15)
Anion gap: 13 (ref 5–15)
Anion gap: 10 (ref 5–15)
BUN: 45 mg/dL — ABNORMAL HIGH (ref 8–23)
BUN: 49 mg/dL — ABNORMAL HIGH (ref 8–23)
BUN: 48 mg/dL — ABNORMAL HIGH (ref 8–23)
CO2: 25 mmol/L (ref 22–32)
CO2: 27 mmol/L (ref 22–32)
CO2: 30 mmol/L (ref 22–32)
Calcium: 8.5 mg/dL — ABNORMAL LOW (ref 8.9–10.3)
Calcium: 8.8 mg/dL — ABNORMAL LOW (ref 8.9–10.3)
Calcium: 8.9 mg/dL (ref 8.9–10.3)
Chloride: 92 mmol/L — ABNORMAL LOW (ref 98–111)
Chloride: 96 mmol/L — ABNORMAL LOW (ref 98–111)
Chloride: 98 mmol/L (ref 98–111)
Creatinine, Ser: 1.44 mg/dL — ABNORMAL HIGH (ref 0.44–1.00)
Creatinine, Ser: 1.24 mg/dL — ABNORMAL HIGH (ref 0.44–1.00)
Creatinine, Ser: 1.08 mg/dL — ABNORMAL HIGH (ref 0.44–1.00)
GFR, Estimated: 37 mL/min — ABNORMAL LOW (ref >60)
GFR, Estimated: 45 mL/min — ABNORMAL LOW (ref >60)
GFR, Estimated: 53 mL/min — ABNORMAL LOW (ref >60)
Glucose, Bld: 700 mg/dL (ref 70–99)
Glucose, Bld: 380 mg/dL — ABNORMAL HIGH (ref 70–99)
Glucose, Bld: 173 mg/dL — ABNORMAL HIGH (ref 70–99)
Potassium: 4.8 mmol/L (ref 3.5–5.1)
Potassium: 3.5 mmol/L (ref 3.5–5.1)
Potassium: 3.5 mmol/L (ref 3.5–5.1)
Sodium: 132 mmol/L — ABNORMAL LOW (ref 135–145)
Sodium: 136 mmol/L (ref 135–145)
Sodium: 138 mmol/L (ref 135–145)

## 2022-01-30 LAB — URINALYSIS, COMPLETE (UACMP) WITH MICROSCOPIC
Bilirubin Urine: NEGATIVE
Glucose, UA: >=500 mg/dL — AB
Ketones, ur: 5 mg/dL — AB
Nitrite: NEGATIVE
Protein, ur: 100 mg/dL — AB
Specific Gravity, Urine: 1.021 (ref 1.005–1.030)
Squamous Epithelial / HPF: NONE SEEN (ref 0–5)
WBC, UA: >50 WBC/hpf — ABNORMAL HIGH (ref 0–5)
pH: 5 (ref 5.0–8.0)

## 2022-01-30 LAB — RESPIRATORY PANEL BY PCR

## 2022-01-30 LAB — CBG MONITORING, ED
Glucose-Capillary: >600 mg/dL (ref 70–99)
Glucose-Capillary: >600 mg/dL (ref 70–99)
Glucose-Capillary: >600 mg/dL (ref 70–99)
Glucose-Capillary: 545 mg/dL (ref 70–99)
Glucose-Capillary: 472 mg/dL — ABNORMAL HIGH (ref 70–99)
Glucose-Capillary: 388 mg/dL — ABNORMAL HIGH (ref 70–99)
Glucose-Capillary: 294 mg/dL — ABNORMAL HIGH (ref 70–99)
Glucose-Capillary: 222 mg/dL — ABNORMAL HIGH (ref 70–99)

## 2022-01-30 LAB — COMPREHENSIVE METABOLIC PANEL
ALT: 8 U/L (ref 0–44)
AST: 12 U/L — ABNORMAL LOW (ref 15–41)
Albumin: 3.2 g/dL — ABNORMAL LOW (ref 3.5–5.0)
Alkaline Phosphatase: 89 U/L (ref 38–126)
Anion gap: 19 — ABNORMAL HIGH (ref 5–15)
BUN: 42 mg/dL — ABNORMAL HIGH (ref 8–23)
CO2: 23 mmol/L (ref 22–32)
Calcium: 8.7 mg/dL — ABNORMAL LOW (ref 8.9–10.3)
Chloride: 90 mmol/L — ABNORMAL LOW (ref 98–111)
Creatinine, Ser: 1.48 mg/dL — ABNORMAL HIGH (ref 0.44–1.00)
GFR, Estimated: 36 mL/min — ABNORMAL LOW (ref >60)
Glucose, Bld: 675 mg/dL (ref 70–99)
Potassium: 4.4 mmol/L (ref 3.5–5.1)
Sodium: 132 mmol/L — ABNORMAL LOW (ref 135–145)
Total Bilirubin: 0.9 mg/dL (ref 0.3–1.2)
Total Protein: 7.1 g/dL (ref 6.5–8.1)

## 2022-01-30 LAB — CBC
HCT: 41.3 % (ref 36.0–46.0)
Hemoglobin: 13.1 g/dL (ref 12.0–15.0)
MCH: 27.6 pg (ref 26.0–34.0)
MCHC: 31.7 g/dL (ref 30.0–36.0)
MCV: 87.1 fL (ref 80.0–100.0)
Platelets: 189 10*3/uL (ref 150–400)
RBC: 4.74 MIL/uL (ref 3.87–5.11)
RDW: 14.9 % (ref 11.5–15.5)
WBC: 7.5 10*3/uL (ref 4.0–10.5)
nRBC: 0 % (ref 0.0–0.2)

## 2022-01-30 LAB — LIPASE, BLOOD: Lipase: 22 U/L (ref 11–51)

## 2022-01-30 LAB — GLUCOSE, CAPILLARY
Glucose-Capillary: 170 mg/dL — ABNORMAL HIGH (ref 70–99)
Glucose-Capillary: 174 mg/dL — ABNORMAL HIGH (ref 70–99)
Glucose-Capillary: 158 mg/dL — ABNORMAL HIGH (ref 70–99)
Glucose-Capillary: 173 mg/dL — ABNORMAL HIGH (ref 70–99)
Glucose-Capillary: 143 mg/dL — ABNORMAL HIGH (ref 70–99)

## 2022-01-30 LAB — TROPONIN I (HIGH SENSITIVITY): Troponin I (High Sensitivity): 20 ng/L — ABNORMAL HIGH (ref <18)

## 2022-01-30 LAB — BRAIN NATRIURETIC PEPTIDE
B Natriuretic Peptide: 85.9 pg/mL (ref 0.0–100.0)
B Natriuretic Peptide: 70.9 pg/mL (ref 0.0–100.0)

## 2022-01-30 LAB — BETA-HYDROXYBUTYRIC ACID
Beta-Hydroxybutyric Acid: 1.71 mmol/L — ABNORMAL HIGH (ref 0.05–0.27)
Beta-Hydroxybutyric Acid: <0.05 mmol/L — ABNORMAL LOW (ref 0.05–0.27)

## 2022-01-30 LAB — MRSA NEXT GEN BY PCR, NASAL: MRSA by PCR Next Gen: NOT DETECTED

## 2022-01-30 LAB — CK: Total CK: 25 U/L — ABNORMAL LOW (ref 38–234)

## 2022-01-30 MED ORDER — ENOXAPARIN SODIUM 80 MG/0.8ML IJ SOSY
1.0000 mg/kg | PREFILLED_SYRINGE | Freq: Two times a day (BID) | INTRAMUSCULAR | Status: DC
  Filled 2022-01-30: qty 0.82

## 2022-01-30 MED ORDER — HYDROCODONE-ACETAMINOPHEN 5-325 MG PO TABS: 1.0000 | ORAL_TABLET | ORAL | Status: DC | PRN

## 2022-01-30 MED ORDER — CHLORHEXIDINE GLUCONATE CLOTH 2 % EX PADS
6.0000 | MEDICATED_PAD | Freq: Every day | CUTANEOUS | Status: DC
  Administered 2022-01-30 – 2022-02-01 (×3): 6 via TOPICAL

## 2022-01-30 MED ORDER — PREDNISONE 20 MG PO TABS: 40.0000 mg | ORAL_TABLET | Freq: Every day | ORAL | Status: DC

## 2022-01-30 MED ORDER — SODIUM CHLORIDE 0.9% FLUSH
3.0000 mL | Freq: Two times a day (BID) | INTRAVENOUS | Status: DC
  Administered 2022-01-30 – 2022-02-03 (×8): 3 mL via INTRAVENOUS

## 2022-01-30 MED ORDER — HYDRALAZINE HCL 20 MG/ML IJ SOLN: 5.0000 mg | INTRAMUSCULAR | Status: DC | PRN

## 2022-01-30 MED ORDER — LACTATED RINGERS IV SOLN: INTRAVENOUS | Status: AC

## 2022-01-30 MED ORDER — INSULIN ASPART 100 UNIT/ML IJ SOLN: 0.0000 [IU] | Freq: Every day | INTRAMUSCULAR | Status: DC

## 2022-01-30 MED ORDER — DEXTROSE IN LACTATED RINGERS 5 % IV SOLN: INTRAVENOUS | Status: DC

## 2022-01-30 MED ORDER — MONTELUKAST SODIUM 10 MG PO TABS
10.0000 mg | ORAL_TABLET | Freq: Every day | ORAL | Status: DC
  Administered 2022-01-30 – 2022-02-02 (×4): 10 mg via ORAL
  Filled 2022-01-30 (×4): qty 1

## 2022-01-30 MED ORDER — INSULIN REGULAR(HUMAN) IN NACL 100-0.9 UT/100ML-% IV SOLN
INTRAVENOUS | Status: DC
  Administered 2022-01-30: 15 [IU]/h via INTRAVENOUS
  Filled 2022-01-30 (×2): qty 100

## 2022-01-30 MED ORDER — INSULIN ASPART 100 UNIT/ML IJ SOLN: 0.0000 [IU] | Freq: Three times a day (TID) | INTRAMUSCULAR | Status: DC

## 2022-01-30 MED ORDER — MORPHINE SULFATE (PF) 2 MG/ML IV SOLN: 2.0000 mg | INTRAVENOUS | Status: DC | PRN

## 2022-01-30 MED ORDER — DILTIAZEM HCL-DEXTROSE 125-5 MG/125ML-% IV SOLN (PREMIX): 3.0000 mg/h | INTRAVENOUS | Status: DC

## 2022-01-30 MED ORDER — POTASSIUM CHLORIDE 10 MEQ/100ML IV SOLN: 10.0000 meq | INTRAVENOUS | Status: AC

## 2022-01-30 MED ORDER — INSULIN ASPART 100 UNIT/ML IJ SOLN: 16.0000 [IU] | Freq: Three times a day (TID) | INTRAMUSCULAR | Status: DC

## 2022-01-30 MED ORDER — ZOLPIDEM TARTRATE 5 MG PO TABS: 5.0000 mg | ORAL_TABLET | Freq: Every evening | ORAL | Status: DC | PRN

## 2022-01-30 MED ORDER — LACTATED RINGERS IV SOLN: INTRAVENOUS | Status: DC

## 2022-01-30 MED ORDER — BISACODYL 5 MG PO TBEC: 5.0000 mg | DELAYED_RELEASE_TABLET | Freq: Every day | ORAL | Status: DC | PRN

## 2022-01-30 MED ORDER — INSULIN GLARGINE-YFGN 100 UNIT/ML ~~LOC~~ SOLN
22.0000 [IU] | Freq: Two times a day (BID) | SUBCUTANEOUS | Status: DC
  Administered 2022-01-30: 22 [IU] via SUBCUTANEOUS
  Filled 2022-01-30 (×2): qty 0.22

## 2022-01-30 MED ORDER — LACTATED RINGERS IV BOLUS: 20.0000 mL/kg | Freq: Once | INTRAVENOUS | Status: DC

## 2022-01-30 MED ORDER — DILTIAZEM HCL ER COATED BEADS 120 MG PO CP24
120.0000 mg | ORAL_CAPSULE | Freq: Every day | ORAL | Status: DC
  Administered 2022-01-30 – 2022-02-03 (×5): 120 mg via ORAL
  Filled 2022-01-30 (×5): qty 1

## 2022-01-30 MED ORDER — ENOXAPARIN SODIUM 100 MG/ML IJ SOSY
1.0000 mg/kg | PREFILLED_SYRINGE | Freq: Two times a day (BID) | INTRAMUSCULAR | Status: DC
  Administered 2022-01-30 (×2): 82.5 mg via SUBCUTANEOUS
  Filled 2022-01-30 (×3): qty 0.82

## 2022-01-30 MED ORDER — ORAL CARE MOUTH RINSE: 15.0000 mL | OROMUCOSAL | Status: DC | PRN

## 2022-01-30 MED ORDER — INSULIN ASPART 100 UNIT/ML IJ SOLN: 7.0000 [IU] | Freq: Three times a day (TID) | INTRAMUSCULAR | Status: DC

## 2022-01-30 MED ORDER — METHYLPREDNISOLONE SODIUM SUCC 125 MG IJ SOLR
60.0000 mg | Freq: Two times a day (BID) | INTRAMUSCULAR | Status: DC
  Administered 2022-01-30: 60 mg via INTRAVENOUS
  Filled 2022-01-30: qty 2

## 2022-01-30 MED ORDER — INSULIN ASPART 100 UNIT/ML IJ SOLN
0.0000 [IU] | Freq: Three times a day (TID) | INTRAMUSCULAR | Status: DC
  Administered 2022-01-31: 5 [IU] via SUBCUTANEOUS
  Administered 2022-01-31: 8 [IU] via SUBCUTANEOUS
  Administered 2022-01-31: 11 [IU] via SUBCUTANEOUS
  Administered 2022-02-01: 3 [IU] via SUBCUTANEOUS
  Filled 2022-01-30 (×4): qty 1

## 2022-01-30 MED ORDER — LACTATED RINGERS IV BOLUS
500.0000 mL | Freq: Once | INTRAVENOUS | Status: AC
  Administered 2022-01-30: 500 mL via INTRAVENOUS

## 2022-01-30 MED ORDER — ALBUTEROL SULFATE (2.5 MG/3ML) 0.083% IN NEBU
2.5000 mg | INHALATION_SOLUTION | RESPIRATORY_TRACT | Status: DC | PRN
  Administered 2022-01-30: 2.5 mg via RESPIRATORY_TRACT
  Filled 2022-01-30: qty 3

## 2022-01-30 MED ORDER — FLUTICASONE PROPIONATE 50 MCG/ACT NA SUSP
2.0000 | Freq: Every day | NASAL | Status: DC
  Administered 2022-01-31 – 2022-02-03 (×4): 2 via NASAL
  Filled 2022-01-30: qty 16

## 2022-01-30 MED ORDER — DEXTROSE 50 % IV SOLN: 0.0000 mL | INTRAVENOUS | Status: DC | PRN

## 2022-01-30 MED ORDER — METHYLPREDNISOLONE SODIUM SUCC 40 MG IJ SOLR
40.0000 mg | Freq: Every day | INTRAMUSCULAR | Status: DC
  Administered 2022-01-30 – 2022-02-01 (×3): 40 mg via INTRAVENOUS
  Filled 2022-01-30 (×3): qty 1

## 2022-01-30 MED ORDER — POLYETHYLENE GLYCOL 3350 17 G PO PACK: 17.0000 g | PACK | Freq: Every day | ORAL | Status: DC | PRN

## 2022-01-30 MED ORDER — HYDROCODONE-ACETAMINOPHEN 5-325 MG PO TABS
1.0000 | ORAL_TABLET | Freq: Four times a day (QID) | ORAL | Status: DC | PRN
  Administered 2022-01-31 – 2022-02-03 (×5): 1 via ORAL
  Filled 2022-01-30 (×5): qty 1

## 2022-01-30 MED ORDER — APIXABAN 5 MG PO TABS
5.0000 mg | ORAL_TABLET | Freq: Two times a day (BID) | ORAL | Status: DC
  Administered 2022-01-31 – 2022-02-03 (×7): 5 mg via ORAL
  Filled 2022-01-30 (×7): qty 1

## 2022-01-30 MED ORDER — IPRATROPIUM-ALBUTEROL 0.5-2.5 (3) MG/3ML IN SOLN
3.0000 mL | Freq: Four times a day (QID) | RESPIRATORY_TRACT | Status: DC
  Administered 2022-01-30 – 2022-02-01 (×11): 3 mL via RESPIRATORY_TRACT
  Filled 2022-01-30 (×11): qty 3

## 2022-01-30 MED ORDER — DOCUSATE SODIUM 100 MG PO CAPS
100.0000 mg | ORAL_CAPSULE | Freq: Two times a day (BID) | ORAL | Status: DC
  Administered 2022-01-31: 100 mg via ORAL
  Filled 2022-01-30: qty 1

## 2022-01-30 MED ORDER — SODIUM CHLORIDE 0.9 % IV SOLN
2.0000 g | INTRAVENOUS | Status: DC
  Administered 2022-01-30 – 2022-02-01 (×3): 2 g via INTRAVENOUS
  Filled 2022-01-30 (×3): qty 20

## 2022-01-30 MED ORDER — POTASSIUM CHLORIDE 10 MEQ/100ML IV SOLN
10.0000 meq | INTRAVENOUS | Status: AC
  Administered 2022-01-30 (×2): 10 meq via INTRAVENOUS
  Filled 2022-01-30 (×2): qty 100

## 2022-01-30 MED ORDER — GUAIFENESIN ER 600 MG PO TB12: 600.0000 mg | ORAL_TABLET | Freq: Two times a day (BID) | ORAL | Status: DC | PRN

## 2022-01-30 NOTE — Assessment & Plan Note (Deleted)
Started rocephin.

## 2022-01-30 NOTE — ED Notes (Addendum)
  Family at bedside- updated on POC.  CBG checked - pt remains lethargic but arousable to soft voice. VSS

## 2022-01-30 NOTE — Progress Notes (Signed)
ANTICOAGULATION CONSULT NOTE - Initial Consult  Pharmacy Consult for Lovenox  Indication: Afib   Allergies  Allergen Reactions   Ace Inhibitors Hives   Beta Adrenergic Blockers     Junctional bradycardia   Gabapentin Hives   Lisinopril Hives   Lyrica [Pregabalin] Hives   Shrimp [Shellfish Allergy] Swelling    Swelling of the lips    Patient Measurements: Height: 5\' 3"  (160 cm) Weight: 81.6 kg (180 lb) IBW/kg (Calculated) : 52.4 Heparin Dosing Weight:   Vital Signs: Temp: 98 F (36.7 C) (11/27 0130) Temp Source: Oral (11/27 0130) BP: 115/49 (11/27 0230) Pulse Rate: 91 (11/27 0230)  Labs: Recent Labs    01/29/22 2102 01/29/22 2300  HGB 13.2  --   HCT 40.7  --   PLT 182  --   APTT 37*  --   LABPROT 15.4*  --   INR 1.2  --   CREATININE 1.24*  --   CKTOTAL  --  25*  TROPONINIHS 24* 20*    Estimated Creatinine Clearance: 38.4 mL/min (A) (by C-G formula based on SCr of 1.24 mg/dL (H)).   Medical History: Past Medical History:  Diagnosis Date   (HFpEF) heart failure with preserved ejection fraction ([place])    a. 2017 Echo: EF 50%; b. 06/2018 Echo: EF 50-55%; c. 08/2018 Echo: EF 50-55%, Nl RV fxn; d. 09/2019 Echo: EF 55-60%, no rwma, mild LVH, Gr1 DD, nl RV size/fxn, PASP 63.100mmHg. Mildly dil LA. Triv MR. Mod AS (AoV 0.94cm^2 VTI; mean grad 17.82mmHg); d. 02/2021 Echo: EF 60-65%, no rwma, GrI DD, mildly red RV fxn, RVSP 64.20mmHg, mild-mod MR, mod-sev TR, mild AS.   Acute on chronic respiratory failure with hypoxia and hypercapnia (HCC) 01/07/2015   Anemia    Asterixis 01/07/2015   Asthma    AVM (arteriovenous malformation) of small bowel, acquired    Cataract    CKD (chronic kidney disease), stage III (HCC)    COPD (chronic obstructive pulmonary disease) ([place])    a. 06/2018 tobacco use, home 3L oxygen    COPD with acute exacerbation ([place]) 09/28/2019   Diabetes mellitus without complication ([place])    a. 09/2019 A1C 8.8   Edema, peripheral 04/20/2014   GI bleed  06/28/2019   History of kidney stones    Hyperlipidemia    Hypertension    Iron deficiency anemia 06/22/2014   Junctional bradycardia    a. In setting of beta blocker therapy.   Leucocytosis 10/19/2015   Moderate aortic stenosis    a.  09/2019 Echo: Mod AS (AoV 0.94cm^2 VTI; mean grad 17.71mmHg); b. 02/2021 Echo: Mild AS.   Morbid obesity (HCC)    Overactive bladder    Pleural effusion    Primary osteoarthritis of right knee 09/01/2016   Sciatica 01/07/2015   Symptomatic bradycardia 08/05/2018   Urge incontinence 11/12/2017   Valvular heart disease    a. 02/2021 Echo: EF 60-65%, no rwma, GrI DD, mild-mod MR, mod-sev TR, mild AS    Medications:  (Not in a hospital admission)   Assessment: Pharmacy consulted to dose lovenox for chronic Afib in this 76 year old female admitted with respiratory failure, UTI, AMS.  Pt was on Eliquis 5 mg PO BID PTA,  had 14 day supply filled on 12/17/21 but is no longer taking per med rec.   Goal of Therapy:  Prevention of thromboembolism Monitor platelets by anticoagulation protocol: Yes   Plan:  Lovenox 82.5 mg (1 mg/kg) SQ Q12H ordered to start on 11/27 @ 0330.  Will check CBC daily.   Ardell Makarewicz D 01/30/2022,3:36 AM

## 2022-01-30 NOTE — ED Notes (Signed)
Incontinent of stool.  Peri care provided, linens changed.  Pt placed into hospital gown and positioned for comfort.  Rails up for safety and call light in reach.

## 2022-01-30 NOTE — ED Notes (Signed)
Dr. Leslye Peer notified that BMP glucose was 700 pre-insulin drip initiation.

## 2022-01-30 NOTE — ED Notes (Signed)
Pt reports less SOB after breathing treatment - oxygen fluctuating between 90-94 on 4L

## 2022-01-30 NOTE — ED Notes (Signed)
Pt on home oxygen typically at 3L - family increased to 4 yesterday.   Pt has been more bed bound over the last few weeks requiring full assistance from family to get into wheelchair, change brief go to  bathroom etc.

## 2022-01-30 NOTE — TOC Initial Note (Signed)
Transition of Care [facility]) - Initial/Assessment Note    Patient Details  Name: Karen [name] MRN: [id number] Date of Birth: October 11, 1945  [facility]) CM/SW Contact:    Shelbie Hutching, RN Phone Number: 01/30/2022, 2:15 PM  Clinical Narrative:                 Patient admitted to the hospital with COPD exacerbation, DKA.  RNCM met with patient at the bedside, daughter, Rodena Piety is also present.  Daughter has come up from [place].  The patient lives with her son and her husband.  Patient has not been doing well over the past week, getting weaker and short of breath with exertion.  Daughter has been having to help her get to her wheelchair and help her with bathroom and hygiene.  At baseline patient walks with a walker, she is on chronic oxygen at 3L, currently on 4.5 L.  Patient does not drive her family provides transportation.    Patient has been to SNF rehab in the past and would be agreeable to going again.  She has been to [facility] and that is where she would like to go this time also.   Expected Discharge Plan: Skilled Nursing Facility Barriers to Discharge: Continued Medical Work up   Patient Goals and CMS Choice Patient states their goals for this hospitalization and ongoing recovery are:: patient wants to feel better CMS Medicare.gov Compare Post Acute Care list provided to:: Patient Choice offered to / list presented to : Patient  Expected Discharge Plan and Services Expected Discharge Plan: [place]   Discharge Planning Services: CM Consult Post Acute Care Choice: [place] Living arrangements for the past 2 months: Single Family Home                 DME Arranged: N/A         HH Arranged: NA [place] Agency: NA        Prior Living Arrangements/Services Living arrangements for the past 2 months: Single Family Home Lives with:: Spouse, Adult Children Patient language and need for interpreter reviewed:: Yes Do you feel safe going  back to the place where you live?: Yes      Need for Family Participation in Patient Care: Yes (Comment) Care giver support system in place?: Yes (comment) Current home services: DME (walker, oxygen, hospital bed, wheelchair) Criminal Activity/Legal Involvement Pertinent to Current Situation/Hospitalization: No - Comment as needed  Activities of Daily Living      Permission Sought/Granted Permission sought to share information with : Case Manager, Family Supports, Customer service manager Permission granted to share information with : Yes, Verbal Permission Granted  Share Information with NAME: Champagne Paletta  Permission granted to share info w AGENCY: [facility]  Permission granted to share info w Relationship: spouse  Permission granted to share info w Contact Information: [phone]  Emotional Assessment Appearance:: Appears stated age Attitude/Demeanor/Rapport: Engaged Affect (typically observed): Accepting Orientation: : Oriented to Self, Oriented to Place, Oriented to  Time, Oriented to Situation Alcohol / Substance Use: Not Applicable Psych Involvement: No (comment)  Admission diagnosis:  Respiratory failure with hypoxia (HCC) [J96.91] Patient Active Problem List   Diagnosis Date Noted   Respiratory failure with hypoxia ([place]) 01/30/2022   DKA (diabetic ketoacidosis) ([place]) 01/30/2022   Acute cystitis with hematuria 01/30/2022   Pericardial effusion    Permanent atrial fibrillation (HCC)    Severe pulmonary hypertension ([place]) 05/29/2021   OSA (obstructive sleep apnea) 05/29/2021   Elevated troponin  05/28/2021   Prolonged QT interval 05/28/2021   At risk for fall due to comorbid condition 02/54/2706   Acute metabolic encephalopathy    Acute kidney injury superimposed on CKD ([place])    Impaired gait and mobility 12/20/2020   Pulmonary nodules/lesions, multiple 10/29/2020   Anemia in chronic kidney disease 10/29/2020   Aortic stenosis, moderate    Gastroesophageal  reflux disease without esophagitis    Abnormal CT of liver 05/21/2020   Athscl heart disease 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 03/06/2020   Acute hip pain, left 10/23/2019   Lumbar stenosis with neurogenic claudication 10/23/2019   (HFpEF) heart failure with preserved ejection fraction ([place]) 09/27/2019   Acute on chronic diastolic CHF (congestive heart failure) ([place]) 02/23/2019   Acute on chronic respiratory failure with hypoxia ([place]) 02/23/2019   Osteopenia of neck of left femur 11/20/2018   Chronic diastolic heart failure ([place]) 08/22/2018   Diarrhea 08/22/2018   Junctional bradycardia    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 diabetes mellitus ([place]) 01/25/2018   History of non anemic vitamin B12 deficiency 01/25/2018   Personal history of kidney stones 11/12/2017   GI bleed 08/26/2017   Arthritis 08/10/2017   Hypertension 08/10/2017   Primary osteoarthritis of right knee 09/01/2016   Asterixis 01/07/2015   Sciatica 01/07/2015   Chronic 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 01/04/2015   Iron deficiency anemia 06/22/2014   Microalbuminuria 06/22/2014   Edema, peripheral 04/20/2014   PCP:  Margarita Rana, MD Pharmacy:   [facility] [address], [place] [address] [place] [place] [address] Phone: [phone] Fax: [phone]     Social Determinants of Health (SDOH) Interventions    Readmission Risk Interventions    01/30/2022    2:13 PM 10/30/2020    4:17 PM 06/21/2020    3:03 PM  Readmission Risk Prevention Plan  Transportation Screening Complete Complete Complete  Medication Review (RN Care Manager) Complete Complete Complete  PCP or Specialist appointment within 3-5 days of discharge Complete Complete Complete  HRI or Home Care Consult Complete Patient refused Complete  SW Recovery Care/Counseling Consult Complete Complete Complete  Palliative Care Screening Not Applicable Not Applicable Not Applicable   Skilled Nursing Facility Complete Not Applicable Not Applicable

## 2022-01-30 NOTE — Assessment & Plan Note (Deleted)
    Latest Ref Rng & Units 01/29/2022    9:02 PM 11/29/2021    4:06 AM 11/28/2021    1:15 PM  CBC  WBC 4.0 - 10.5 K/uL 11.8  11.5  13.6   Hemoglobin 12.0 - 15.0 g/dL 13.2  11.9  11.9   Hematocrit 36.0 - 46.0 % 40.7  35.9  36.9   Platelets 150 - 400 K/uL 182  271  276   Hemoglobin is currently 13.2 which I suspect is erroneous secondary to dehydration. Will hydrate and follow. Type and screen. IV PPI therapy.

## 2022-01-30 NOTE — Assessment & Plan Note (Signed)
We will add on a urine culture to urine analysis that was sent yesterday.  Continue Rocephin.

## 2022-01-30 NOTE — Assessment & Plan Note (Addendum)
Use Cardizem CD for rate control

## 2022-01-30 NOTE — Assessment & Plan Note (Signed)
Likely secondary to acute cystitis but also could be related to DKA.  CT scan of the head negative.

## 2022-01-30 NOTE — Inpatient Diabetes Management (Signed)
Inpatient Diabetes Program Recommendations  AACE/ADA: New Consensus Statement on Inpatient Glycemic Control (2015)  Target Ranges:  Prepandial:   less than 140 mg/dL      Peak postprandial:   less than 180 mg/dL (1-2 hours)      Critically ill patients:  140 - 180 mg/dL   Lab Results  Component Value Date   GLUCAP >600 (HH) 01/30/2022   HGBA1C 9.1 (H) 08/08/2021    Latest Reference Range & Units 01/30/22 05:57 01/30/22 08:04  Glucose-Capillary 70 - 99 mg/dL >600 (HH) >600 (HH)  (HH): Data is critically high  Latest Reference Range & Units 01/29/22 21:02  Sodium 135 - 145 mmol/L 133 (L)  Potassium 3.5 - 5.1 mmol/L 4.1  Chloride 98 - 111 mmol/L 94 (L)  CO2 22 - 32 mmol/L 27  Glucose 70 - 99 mg/dL 327 (H)  BUN 8 - 23 mg/dL 30 (H)  Creatinine 0.44 - 1.00 mg/dL 1.24 (H)  Calcium 8.9 - 10.3 mg/dL 8.6 (L)  Anion gap 5 - 15  12  Alkaline Phosphatase 38 - 126 U/L 85  Albumin 3.5 - 5.0 g/dL 3.1 (L)  AST 15 - 41 U/L 11 (L)  ALT 0 - 44 U/L 8  Total Protein 6.5 - 8.1 g/dL 6.7  Total Bilirubin 0.3 - 1.2 mg/dL 1.2  GFR, Estimated >60 mL/min 45 (L)  (L): Data is abnormally low (H): Data is abnormally high   Diabetes history: DM2 Outpatient Diabetes medications: Lantus 50 units qd, Humalog 18 units tid meal coverage, Jardiance 10 mg qd Current orders for Inpatient glycemic control: IV insulin per endotool, Solumedrol 40 mg qd  Inpatient Diabetes Program Recommendations:   Noted patient's glucose elevated to >600 after received steroids. Agree with current IV insulin. When ready to transition to [place] insulin, please refer to IV insulin transition order set.  When patient ready to transition, please consider: -Semglee 22 units bid (give first dose 2 hrs prior to D/C of IV insulin drip -Novolog 16 units tid meal coverage when eating 50% or greater meals -Novolog 0-20 units tid + 0-5 units hs correction  Thank you, Bethena Roys E. Tremell Reimers, RN, MSN, CDE  Diabetes Coordinator Inpatient Glycemic  Control Team Team Pager [phone] (8am-5pm) 01/30/2022 8:44 AM

## 2022-01-30 NOTE — Progress Notes (Signed)
Progress Note   Patient: Karen [name] [id number] DOB: 10/27/1945 DOA: 01/29/2022     0 DOS: the patient was seen and examined on 01/30/2022     Assessment and Plan: * DKA (diabetic ketoacidosis) ([place]) Patient's sugars were in the 600s this morning likely from giving steroids and not giving insulin last night.  Anion gap was elevated at 19.  Insulin drip started.  Continue to watch in the stepdown unit.  Acute on chronic respiratory failure with hypoxia ([place]) As per ER physician patient had respiratory distress upon coming in with tachypnea of 34.  Patient this morning on 4 L of oxygen.  Acute cystitis with hematuria We will add on a urine culture to urine analysis that was sent yesterday.  Continue Rocephin.  Acute metabolic encephalopathy Likely secondary to acute cystitis but also could be related to DKA.  CT scan of the head negative.  OSA (obstructive sleep apnea) CPAP at night   Acute kidney injury superimposed on CKD ([place]) Acute kidney injury on CKD stage IIIa.  Creatinine 1.24 on 11/26 and 1.44 today.  Continue IV fluids.    Permanent atrial fibrillation (HCC) Start Cardizem CD.  Patient placed on Lovenox and hopefully convert over to Eliquis this evening   Chronic diastolic heart failure ([place]) Watch closely with IV fluids.  No signs of heart failure currently.  Hypertension Use Cardizem CD for rate control        Subjective: Patient was lethargic this morning but able to move her extremities to command.  Answered a few questions but went back to sleep.  Physical Exam: Vitals:   01/30/22 0800 01/30/22 0907 01/30/22 0930 01/30/22 1000  BP: (!) 112/53 (!) 108/43 (!) 105/44 (!) 108/56  Pulse: 95 96 89 92  Resp: (!) 30 (!) 28 (!) 28 (!) 25  Temp:      TempSrc:      SpO2: 91% 91% 93% 91%  Weight:      Height:       Physical Exam HENT:     Head: Normocephalic.     Mouth/Throat:     Pharynx: No oropharyngeal exudate.  Eyes:     General: Lids  are normal.     Conjunctiva/sclera: Conjunctivae normal.  Cardiovascular:     Rate and Rhythm: Normal rate. Rhythm irregularly irregular.     Heart sounds: Normal heart sounds, S1 normal and S2 normal.  Pulmonary:     Breath sounds: Examination of the right-lower field reveals decreased breath sounds. Examination of the left-lower field reveals decreased breath sounds. Decreased breath sounds present. No wheezing, rhonchi or rales.  Abdominal:     Palpations: Abdomen is soft.     Tenderness: There is no abdominal tenderness.  Musculoskeletal:     Right lower leg: Swelling present.     Left lower leg: Swelling present.  Skin:    General: Skin is warm.     Findings: No rash.  Neurological:     Mental Status: She is lethargic.     Comments: Was able to straight leg raise and lift her arms up off the bed.     Data Reviewed: Creatinine 1.44, sodium 132, glucose this morning 675 on chemistry, anion gap 19, hemoglobin 13.1 Family Communication: Updated patient's husband and daughter on the phone  Disposition: Status is: Inpatient Remains inpatient appropriate because: Started on insulin drip for DKA.  Planned Discharge Destination: Home    Time spent: 35 minutes With DKA patient critically ill started insulin drip this morning.  Continue steroids for acute respiratory failure.  Continue antibiotics for acute cystitis.  Author: Loletha Grayer, MD 01/30/2022 12:03 PM  For on call review [email or web address].

## 2022-01-30 NOTE — ED Notes (Signed)
BG reads HI on monitor - notified Dr. Leslye Peer, Richard at bedside.  Awaiting new orders.

## 2022-01-30 NOTE — Assessment & Plan Note (Addendum)
As per ER physician patient had respiratory distress upon coming in with tachypnea of 34.  Patient this morning on 4 L of oxygen.

## 2022-01-30 NOTE — Assessment & Plan Note (Signed)
Watch closely with IV fluids.  No signs of heart failure currently.

## 2022-01-30 NOTE — Assessment & Plan Note (Deleted)
Glycemic protocol with accucheck q6 hourly.

## 2022-01-30 NOTE — ED Notes (Signed)
Cardizem gtt not started at this time as pt doesn't meet parameters needed.  See vitals for verification.

## 2022-01-30 NOTE — Assessment & Plan Note (Deleted)
Repeat VBG. Treat UTI. Stat head ct.  NPO. Aspiration and fall precaution.

## 2022-01-30 NOTE — Assessment & Plan Note (Addendum)
-   CPAP at night °

## 2022-01-30 NOTE — Assessment & Plan Note (Addendum)
Acute kidney injury on CKD stage IIIa.  Creatinine 1.24 on 11/26 and 1.44 today.  Continue IV fluids.

## 2022-01-30 NOTE — Assessment & Plan Note (Signed)
Patient's sugars were in the 600s this morning likely from giving steroids and not giving insulin last night.  Anion gap was elevated at 19.  Insulin drip started.  Continue to watch in the stepdown unit.

## 2022-01-30 NOTE — Assessment & Plan Note (Addendum)
Started on Cardizem CD and continue Eliquis.

## 2022-01-30 NOTE — ED Notes (Signed)
Per Dr. Leslye Peer - PT DOES NOT NEED STAT BMET AFTER FLUID BOLUS - HOLD POTASSIUM UNTIL REPEAT BMP AT 1200 SINCE POTASSIUM IS WITHIN NORMAL PARAMETERS AT THIS TIME.

## 2022-01-30 NOTE — Assessment & Plan Note (Deleted)
IV PPI therapy.  

## 2022-01-31 DIAGNOSIS — I5032 Chronic diastolic (congestive) heart failure: Secondary | ICD-10-CM

## 2022-01-31 DIAGNOSIS — N3001 Acute cystitis with hematuria: Secondary | ICD-10-CM | POA: Diagnosis not present

## 2022-01-31 DIAGNOSIS — L8990 Pressure ulcer of unspecified site, unspecified stage: Secondary | ICD-10-CM | POA: Insufficient documentation

## 2022-01-31 DIAGNOSIS — N189 Chronic kidney disease, unspecified: Secondary | ICD-10-CM

## 2022-01-31 DIAGNOSIS — E1110 Type 2 diabetes mellitus with ketoacidosis without coma: Secondary | ICD-10-CM

## 2022-01-31 DIAGNOSIS — G4733 Obstructive sleep apnea (adult) (pediatric): Secondary | ICD-10-CM

## 2022-01-31 DIAGNOSIS — J9621 Acute and chronic respiratory failure with hypoxia: Secondary | ICD-10-CM | POA: Diagnosis not present

## 2022-01-31 DIAGNOSIS — I4821 Permanent atrial fibrillation: Secondary | ICD-10-CM

## 2022-01-31 DIAGNOSIS — B338 Other specified viral diseases: Secondary | ICD-10-CM

## 2022-01-31 DIAGNOSIS — N179 Acute kidney failure, unspecified: Secondary | ICD-10-CM

## 2022-01-31 LAB — BASIC METABOLIC PANEL
Anion gap: 8 (ref 5–15)
BUN: 44 mg/dL — ABNORMAL HIGH (ref 8–23)
CO2: 29 mmol/L (ref 22–32)
Calcium: 8.7 mg/dL — ABNORMAL LOW (ref 8.9–10.3)
Chloride: 99 mmol/L (ref 98–111)
Creatinine, Ser: 0.8 mg/dL (ref 0.44–1.00)
GFR, Estimated: >60 mL/min (ref >60)
Glucose, Bld: 306 mg/dL — ABNORMAL HIGH (ref 70–99)
Potassium: 4.1 mmol/L (ref 3.5–5.1)
Sodium: 136 mmol/L (ref 135–145)

## 2022-01-31 LAB — CBC
HCT: 37.4 % (ref 36.0–46.0)
Hemoglobin: 12.1 g/dL (ref 12.0–15.0)
MCH: 27.7 pg (ref 26.0–34.0)
MCHC: 32.4 g/dL (ref 30.0–36.0)
MCV: 85.6 fL (ref 80.0–100.0)
Platelets: 194 10*3/uL (ref 150–400)
RBC: 4.37 MIL/uL (ref 3.87–5.11)
RDW: 14.6 % (ref 11.5–15.5)
WBC: 8.1 10*3/uL (ref 4.0–10.5)
nRBC: 0 % (ref 0.0–0.2)

## 2022-01-31 LAB — GLUCOSE, CAPILLARY
Glucose-Capillary: 321 mg/dL — ABNORMAL HIGH (ref 70–99)
Glucose-Capillary: 336 mg/dL — ABNORMAL HIGH (ref 70–99)
Glucose-Capillary: 265 mg/dL — ABNORMAL HIGH (ref 70–99)
Glucose-Capillary: 304 mg/dL — ABNORMAL HIGH (ref 70–99)
Glucose-Capillary: 205 mg/dL — ABNORMAL HIGH (ref 70–99)
Glucose-Capillary: 166 mg/dL — ABNORMAL HIGH (ref 70–99)

## 2022-01-31 LAB — URINE CULTURE
Culture: NO GROWTH
Special Requests: NORMAL

## 2022-01-31 MED ORDER — ACETAMINOPHEN 325 MG PO TABS
650.0000 mg | ORAL_TABLET | Freq: Four times a day (QID) | ORAL | Status: DC | PRN
  Administered 2022-01-31: 650 mg via ORAL
  Filled 2022-01-31: qty 2

## 2022-01-31 MED ORDER — ADULT MULTIVITAMIN W/MINERALS CH
1.0000 | ORAL_TABLET | Freq: Every day | ORAL | Status: DC
  Administered 2022-02-01 – 2022-02-03 (×3): 1 via ORAL
  Filled 2022-01-31 (×3): qty 1

## 2022-01-31 MED ORDER — INSULIN ASPART 100 UNIT/ML IJ SOLN
10.0000 [IU] | Freq: Three times a day (TID) | INTRAMUSCULAR | Status: DC
  Administered 2022-01-31: 10 [IU] via SUBCUTANEOUS
  Filled 2022-01-31 (×2): qty 1

## 2022-01-31 MED ORDER — ENSURE MAX PROTEIN PO LIQD
11.0000 [oz_av] | Freq: Two times a day (BID) | ORAL | Status: DC
  Administered 2022-01-31 – 2022-02-03 (×5): 11 [oz_av] via ORAL

## 2022-01-31 MED ORDER — INSULIN GLARGINE-YFGN 100 UNIT/ML ~~LOC~~ SOLN
26.0000 [IU] | Freq: Two times a day (BID) | SUBCUTANEOUS | Status: DC
  Administered 2022-01-31 – 2022-02-01 (×2): 26 [IU] via SUBCUTANEOUS
  Filled 2022-01-31 (×2): qty 0.26

## 2022-01-31 MED ORDER — INSULIN ASPART 100 UNIT/ML IJ SOLN
5.0000 [IU] | Freq: Once | INTRAMUSCULAR | Status: AC
  Administered 2022-01-31: 5 [IU] via SUBCUTANEOUS
  Filled 2022-01-31: qty 1

## 2022-01-31 MED ORDER — INSULIN GLARGINE-YFGN 100 UNIT/ML ~~LOC~~ SOLN
24.0000 [IU] | Freq: Two times a day (BID) | SUBCUTANEOUS | Status: DC
  Administered 2022-01-31: 24 [IU] via SUBCUTANEOUS
  Filled 2022-01-31 (×2): qty 0.24

## 2022-01-31 MED ORDER — INSULIN ASPART 100 UNIT/ML IJ SOLN
8.0000 [IU] | Freq: Three times a day (TID) | INTRAMUSCULAR | Status: DC
  Administered 2022-01-31 (×2): 8 [IU] via SUBCUTANEOUS
  Filled 2022-01-31 (×2): qty 1

## 2022-01-31 NOTE — Progress Notes (Signed)
5 units of insulin given  for blood sugar 336 primary nurse Tess notified

## 2022-01-31 NOTE — Progress Notes (Signed)
       CROSS COVER NOTE  NAME: Karen [name] MRN: [id number] DOB : 03/31/1945 ATTENDING PHYSICIAN: Loletha Grayer, MD    Date of Service   01/31/2022   HPI/Events of Note   Notified of elevated CBG--> 336 at [facility] check.  Interventions   Assessment/Plan:  5U Novolog     This document was prepared using Systems analyst and may include unintentional dictation errors.  Neomia Glass DNP, MBA, FNP-BC Nurse Practitioner [facility] Pager [phone]

## 2022-01-31 NOTE — Assessment & Plan Note (Signed)
Supportive care.  Steroids today hopefully can get off steroids soon.  Nebulizer treatments.

## 2022-01-31 NOTE — Assessment & Plan Note (Signed)
Deep tissue injury, present on admission stage I.  See full description below.

## 2022-01-31 NOTE — Progress Notes (Signed)
Initial Nutrition Assessment  DOCUMENTATION CODES:   Obesity unspecified  INTERVENTION:   Ensure Max protein supplement BID, each supplement provides 150kcal and 30g of protein.  MVI po daily   Daily weights  Pt at high refeed risk; recommend monitor potassium, magnesium and phosphorus labs daily until stable  NUTRITION DIAGNOSIS:   Unintentional weight loss related to chronic illness (COPD, CHF, uncontrolled DM) as evidenced by 15 percent weight loss in 6 months.  GOAL:   Patient will meet greater than or equal to 90% of their needs  MONITOR:   PO intake, Supplement acceptance, Labs, Weight trends, Skin, I & O's  REASON FOR ASSESSMENT:   Consult Assessment of nutrition requirement/status  ASSESSMENT:   76 y/o female with h/o OSA, CKD, Afib, CHF, HTN, HLD, COPD, GERD and IDA who is admitted with DKA, AKI, AMS and cystitis.  Met with pt in room today. Pt reports decreased appetite and oral intake pta but reports that she has been eating ok. Per chart, pt is down 30lbs(15%) over the past 6 months; this is significant. Pt is aware of her weight loss but is unsure as to why she has lost weight. Pt eating breakfast at time of RD visit and had eaten some eggs, a few bites of grits, two slices of bacon, a banana and drank 100% of her milk. RD discussed with pt the importance of adequate nutrition needed to preserve lean muscle. Pt is willing to drink vanilla or strawberry supplements. RD will add supplements and MVI to help pt meet her estimated needs. Pt is at high refeed risk.   Medications reviewed and include: colace, insulin, solu-medrol, MVI, ceftriaxone   Labs reviewed: K 4.1 wnl, BUN 44(H) Cbgs- 265, 336, 321 x 24 hrs AIC 9.1(H)- 6/5  NUTRITION - FOCUSED PHYSICAL EXAM:  Flowsheet Row Most Recent Value  Orbital Region No depletion  Upper Arm Region No depletion  Thoracic and Lumbar Region No depletion  Buccal Region No depletion  Temple Region No depletion   Clavicle Bone Region Mild depletion  Clavicle and Acromion Bone Region Mild depletion  Scapular Bone Region No depletion  Dorsal Hand Mild depletion  Patellar Region No depletion  Anterior Thigh Region No depletion  Posterior Calf Region No depletion  Edema (RD Assessment) None  Hair Reviewed  Eyes Reviewed  Mouth Reviewed  Skin Reviewed  Nails Reviewed   Diet Order:   Diet Order             Diet Carb Modified Fluid consistency: Thin; Room service appropriate? Yes  Diet effective now                  EDUCATION NEEDS:   Education needs have been addressed  Skin:  Skin Assessment: Reviewed RN Assessment  Last BM:  11/27- type 7  Height:   Ht Readings from Last 1 Encounters:  01/29/22 _0  (1.6 m)    Weight:   Wt Readings from Last 1 Encounters:  01/29/22 81.6 kg    Ideal Body Weight:  52.2 kg  BMI:  Body mass index is 31.89 kg/m.  Estimated Nutritional Needs:   Kcal:  1700-2000kcal/day  Protein:  85-100g/day  Fluid:  1.4-1.6L/day  Koleen Distance MS, RD, LDN Please refer to [facility] for RD and/or RD on-call/weekend/after hours pager

## 2022-01-31 NOTE — Evaluation (Signed)
Occupational Therapy Evaluation Patient Details Name: Karen [name] MRN: [id number] DOB: 1945-11-01 Today's Date: 01/31/2022   History of Present Illness Karen [name] is a 17yoF who comes to [facility] on 11/26 with complaints of worsening SOB, productive cough, chest pain, and increased O2 requirement at home (Baseline 3 L home oxygen). PMH includes CHF, COPD, CKD, HLD, HTN, and Asthma.   Clinical Impression   [name] was seen for OT evaluation this date. Prior to hospital admission, pt required at least some assistance for ADL management from her spouse/family. Pt states that "on bad days" she would require assistance for bathing and LB dressing. She endorses using a 4WW for household and community mobility. Pt presents to acute OT demonstrating impaired ADL performance and functional mobility 2/2 generalized weakness, decreased activity tolerance, and impaired balance (See OT problem list for additional detail). Pt currently requires MAX A for STS attempts and is unable to progress functional transfers 2/2 significant fatigue in standing. She remains on 5L  t/o session with spO2 intermittently dropping into mid 80's with exertion. Pt rebounds quickly with therapeutic rest breaks. Pt requires MAX A for peri-care and clean up after episode of bowel incontinence with +2 max assist required for sit>supine return to bed.  Pt would benefit from skilled OT services to address noted impairments and functional limitations (see below for any additional details) in order to maximize safety and independence while minimizing falls risk and caregiver burden. Upon hospital discharge, recommend STR to maximize pt safety and return to PLOF.        Recommendations for follow up therapy are one component of a multi-disciplinary discharge planning process, led by the attending physician.  Recommendations may be updated based on patient status, additional functional criteria and insurance authorization.   Follow  Up Recommendations  Skilled nursing-short term rehab (<3 hours/day)     Assistance Recommended at Discharge Intermittent Supervision/Assistance  Patient can return home with the following Two people to help with walking and/or transfers;Assist for transportation;Help with stairs or ramp for entrance;A lot of help with bathing/dressing/bathroom    Functional Status Assessment  Patient has had a recent decline in their functional status and demonstrates the ability to make significant improvements in function in a reasonable and predictable amount of time.  Equipment Recommendations  None recommended by OT (Pt has necessary equipment)    Recommendations for Other Services       Precautions / Restrictions Precautions Precautions: Fall Restrictions Weight Bearing Restrictions: No      Mobility Bed Mobility Overal bed mobility: Needs Assistance Bed Mobility: Supine to Sit, Sit to Supine     Supine to sit: Min assist, HOB elevated Sit to supine: Max assist        Transfers Overall transfer level: Needs assistance Equipment used: Rolling walker (2 wheels) Transfers: Sit to/from Stand Sit to Stand: From elevated surface, Max assist                  Balance Overall balance assessment: Needs assistance Sitting-balance support: Feet unsupported, Bilateral upper extremity supported Sitting balance-Leahy Scale: Fair     Standing balance support: Reliant on assistive device for balance, During functional activity, Bilateral upper extremity supported Standing balance-Leahy Scale: Poor Standing balance comment: Heavy reliance/lean on RW in static standing.                           ADL either performed or assessed with clinical judgement   ADL Overall  ADL's : Needs assistance/impaired                     Lower Body Dressing: Maximal assistance Lower Body Dressing Details (indicate cue type and reason): MAX A to don bilat socks Toilet Transfer:  Maximal assistance;Stand-pivot Toilet Transfer Details (indicate cue type and reason): Pt found to be soiled of BM. Requires MAX A for STS to perform clean up/peri-care fatigues quickly. Toileting- Clothing Manipulation and Hygiene: +2 for physical assistance;Maximal assistance       Functional mobility during ADLs: Moderate assistance;Cueing for safety;Rolling walker (2 wheels) General ADL Comments: Functionally limited by generalized weakness, decreased activity tolerance, and impaired balance.     Vision Patient Visual Report: No change from baseline       Perception     Praxis      Pertinent Vitals/Pain Pain Assessment Pain Assessment: No/denies pain     Hand Dominance Right   Extremity/Trunk Assessment Upper Extremity Assessment Upper Extremity Assessment: Generalized weakness   Lower Extremity Assessment Lower Extremity Assessment: Generalized weakness       Communication Communication Communication: No difficulties   Cognition Arousal/Alertness: Lethargic Behavior During Therapy: WFL for tasks assessed/performed Overall Cognitive Status: Within Functional Limits for tasks assessed                                 General Comments: Pt oriented to self, place, and situation. She is mildly lethargic during session but overall WFL.     General Comments       Exercises Other Exercises Other Exercises: Pt educated on role of OT in acute setting, safe use of AE/DME for ADL management, safe transfer technique, and routines modifications to support safety and functional independence during ADL management. Pt requires assist for ADL management as described above. See ADL section for additional detail.   Shoulder Instructions      Home Living Family/patient expects to be discharged to:: Private residence Living Arrangements: Spouse/significant other Available Help at Discharge: Family;Available 24 hours/day Type of Home: Mobile home Home Access:  Ramped entrance     Home Layout: One level     Bathroom Shower/Tub: Teacher, early years/pre: Handicapped height     Home Equipment: Grab bars - toilet;Grab bars - tub/shower;Rollator (4 wheels);Rolling Walker (2 wheels);BSC/3in1;Wheelchair - manual          Prior Functioning/Environment Prior Level of Function : Needs assist       Physical Assist : ADLs (physical)   ADLs (physical): Bathing;Dressing;IADLs Mobility Comments: amb with rollator house hold distances, mwc community distances ADLs Comments: Pt husband assists with IADLs; bathing, LB dressing;        OT Problem List: Decreased strength;Decreased coordination;Cardiopulmonary status limiting activity;Decreased range of motion;Decreased activity tolerance;Decreased safety awareness;Impaired balance (sitting and/or standing);Decreased knowledge of use of DME or AE      OT Treatment/Interventions: Self-care/ADL training;Therapeutic exercise;Balance training;Therapeutic activities;DME and/or AE instruction;Patient/family education;Energy conservation    OT Goals(Current goals can be found in the care plan section) Acute Rehab OT Goals Patient Stated Goal: To feel better OT Goal Formulation: With patient Time For Goal Achievement: 02/14/22 Potential to Achieve Goals: Good ADL Goals Pt Will Perform Grooming: sitting;with set-up;with supervision Pt Will Perform Lower Body Dressing: sit to/from stand;with min assist;with adaptive equipment (c LRAD PRN) Pt Will Transfer to Toilet: bedside commode;stand pivot transfer;with set-up;with supervision (c LRAD PRN) Pt Will Perform Toileting - Clothing Manipulation  and hygiene: sit to/from stand;with min assist;with adaptive equipment (c LRAD PRN)  OT Frequency: Min 2X/week    Co-evaluation              AM-PAC OT "6 Clicks" Daily Activity     Outcome Measure Help from another person eating meals?: A Little Help from another person taking care of personal  grooming?: A Little Help from another person toileting, which includes using toliet, bedpan, or urinal?: A Lot Help from another person bathing (including washing, rinsing, drying)?: A Lot Help from another person to put on and taking off regular upper body clothing?: A Little Help from another person to put on and taking off regular lower body clothing?: A Lot 6 Click Score: 15   End of Session Equipment Utilized During Treatment: Gait belt;Rolling walker (2 wheels) Nurse Communication: Mobility status  Activity Tolerance: Patient limited by fatigue Patient left: in bed;with call bell/phone within reach;with bed alarm set  OT Visit Diagnosis: Other abnormalities of gait and mobility (R26.89);Muscle weakness (generalized) (M62.81)                Time: 9563-8756 OT Time Calculation (min): 25 min Charges:  OT General Charges $OT Visit: 1 Visit OT Evaluation $OT Eval Moderate Complexity: 1 Mod OT Treatments $Self Care/Home Management : 8-22 mins  Shara Blazing, M.S., OTR/L 01/31/22, 12:44 PM

## 2022-01-31 NOTE — Hospital Course (Signed)
76 year old female with history of type 2 diabetes mellitus, COPD on chronic oxygen 3 L presented to the hospital with lethargy and wheezing.  Patient was started on empirically on Rocephin for possible urinary infection.  She was started on steroids and then the sugars went very elevated and on the morning of 11/27 was in diabetic ketoacidosis.  The patient was started on insulin drip and was able to come off insulin drip on the evening of 01/30/2022.  Viral respiratory panel positive for RSV.

## 2022-01-31 NOTE — Evaluation (Signed)
Physical Therapy Evaluation Patient Details Name: Karen [name] MRN: [id number] DOB: 1945/12/13 Today's Date: 01/31/2022  History of Present Illness  Karen [name] is a 40yoF who comes to [facility] with SOB. PMH: COPD on 3L, CHF. No obvious findings consistent with a pneumonia.  COVID and influenza testing are negative. Pt DTR,pt also with AMS. Prior to hospital admission, pt required at least some assistance for ADL management from her spouse/family. Pt states that "on bad days" she would require assistance for bathing and LB dressing. She endorses using a 4WW for household and community mobility.  Clinical Impression  Pt in bed, breathing treatment recently completed. Pt agreeable to session, still feels poorly, reports 9/10 acute on chronic low back pain. RN asked about pain meds. Max effort for bed mobility, min-modA for STS unless EOB is elevated quite a bit. Pt tolerates 4 standings, all with difficulty obtaining balance and slight posterior support of legs on bed. Pt maintains standing for author assisted pericare and later for modI pericare. Sidesteps at EOB are labored and poor controlled, appear quite unsafe. A STR/SNF stay would offer best opportunity for return of function for safe eventual return to home environment. Unit sec made aware of need for new purewick.      Recommendations for follow up therapy are one component of a multi-disciplinary discharge planning process, led by the attending physician.  Recommendations may be updated based on patient status, additional functional criteria and insurance authorization.  Follow Up Recommendations Skilled nursing-short term rehab (<3 hours/day) Can patient physically be transported by private vehicle: No (on O2 must go EMS)    Assistance Recommended at Discharge Intermittent Supervision/Assistance  Patient can return home with the following  A lot of help with walking and/or transfers;A lot of help with  bathing/dressing/bathroom;Assistance with feeding;Help with stairs or ramp for entrance;Assist for transportation;Direct supervision/assist for financial management    Equipment Recommendations None recommended by PT  Recommendations for Other Services       Functional Status Assessment Patient has had a recent decline in their functional status and demonstrates the ability to make significant improvements in function in a reasonable and predictable amount of time.     Precautions / Restrictions Precautions Precautions: Fall Restrictions Weight Bearing Restrictions: No      Mobility  Bed Mobility Overal bed mobility: Needs Assistance Bed Mobility: Supine to Sit, Sit to Supine     Supine to sit: Supervision Sit to supine: Supervision   General bed mobility comments: max effort required with lengty time needs. Author keeps chuck pad in place fo r pt    Transfers Overall transfer level: Needs assistance Equipment used: Rolling walker (2 wheels) Transfers: Sit to/from Stand Sit to Stand: From elevated surface, Min assist           General transfer comment: posterior bias, mostly due to RW use on a 4WW patient; difficulty correcting desipte ample oppoetunity    Ambulation/Gait Ambulation/Gait assistance:  (side steps at EOB only, too weak to progress to overground AMB)                Stairs            Wheelchair Mobility    Modified Rankin (Stroke Patients Only)       Balance  Pertinent Vitals/Pain Pain Assessment Pain Assessment: 0-10 Pain Score: 9  Pain Location: back pain, acute on chronic Pain Intervention(s): Limited activity within patient's tolerance, Monitored during session, Patient requesting pain meds-RN notified (pt takes tylenol for it at home, asking for tylenol now)    Home Living Family/patient expects to be discharged to:: Private residence Living Arrangements:  Spouse/significant other Available Help at Discharge: Family;Available 24 hours/day Type of Home: Mobile home Home Access: Ramped entrance       Home Layout: One level Home Equipment: Grab bars - toilet;Grab bars - tub/shower;Rollator (4 wheels);Rolling Walker (2 wheels);BSC/3in1;Wheelchair - manual      Prior Function Prior Level of Function : Needs assist       Physical Assist : ADLs (physical)   ADLs (physical): Bathing;Dressing;IADLs Mobility Comments: amb with rollator house hold distances, mwc community distances ADLs Comments: Pt husband assists with IADLs; bathing, LB dressing;     Hand Dominance   Dominant Hand: Right    Extremity/Trunk Assessment   Upper Extremity Assessment Upper Extremity Assessment: Generalized weakness    Lower Extremity Assessment Lower Extremity Assessment: Generalized weakness       Communication   Communication: No difficulties  Cognition Arousal/Alertness: Awake/alert Behavior During Therapy: WFL for tasks assessed/performed, Flat affect Overall Cognitive Status: Within Functional Limits for tasks assessed                                 General Comments: delayed responses        General Comments      Exercises Other Exercises Other Exercises: STS from elevated EOB, RW; 4x in session. Author provides pericare assist and total chux replacement. Final stand, pt performs anterior pericare with wet wipe. No bowel, just residue.   Assessment/Plan    PT Assessment Patient needs continued PT services  PT Problem List Decreased strength;Decreased activity tolerance;Decreased balance;Decreased mobility       PT Treatment Interventions Gait training;Stair training;Functional mobility training;Therapeutic activities;Therapeutic exercise;Balance training;Patient/family education    PT Goals (Current goals can be found in the Care Plan section)  Acute Rehab PT Goals Patient Stated Goal: regain strength for eveltual  return to home PT Goal Formulation: With patient Time For Goal Achievement: 02/14/22 Potential to Achieve Goals: Good    Frequency Min 2X/week     Co-evaluation               AM-PAC PT "6 Clicks" Mobility  Outcome Measure Help needed turning from your back to your side while in a flat bed without using bedrails?: A Little Help needed moving from lying on your back to sitting on the side of a flat bed without using bedrails?: A Little Help needed moving to and from a bed to a chair (including a wheelchair)?: A Lot Help needed standing up from a chair using your arms (e.g., wheelchair or bedside chair)?: A Lot Help needed to walk in hospital room?: Total Help needed climbing 3-5 steps with a railing? : Total 6 Click Score: 12    End of Session Equipment Utilized During Treatment: Oxygen Activity Tolerance: Patient tolerated treatment well;Patient limited by fatigue;Patient limited by pain Patient left: in bed;with call bell/phone within reach;with bed alarm set Nurse Communication: Mobility status PT Visit Diagnosis: Difficulty in walking, not elsewhere classified (R26.2);Other abnormalities of gait and mobility (R26.89);Muscle weakness (generalized) (M62.81)    Time: 1027-2536 PT Time Calculation (min) (ACUTE ONLY): 17 min   Charges:  PT Evaluation $PT Eval Low Complexity: 1 Low PT Treatments $Therapeutic Activity: 8-22 mins       2:44 PM, 01/31/22 [name], PT, DPT Physical Therapist - [facility]  [phone] ([place])    Sumie Remsen C 01/31/2022, 2:41 PM

## 2022-01-31 NOTE — Progress Notes (Addendum)
Patient awakened at 0900 for assessment and medications. Patient confused on date and time. Also can't remember the last time she had a BM. States son lives with her and husband. Set up for breakfast. Ate slowly but no swallowing issues noted. Very congested sounding cough but lungs only have a few rhonchi. Slight edema in bilateral ankles.1100 Stood with PT/OT but was incontinent of stool.Cleaned with some redness of perineal area noted.1130 Report called to Joe RN on 1C. Moved to 1 C via bed. 1230 Transferred to 124 via bed.

## 2022-01-31 NOTE — Progress Notes (Signed)
Progress Note   Patient: Karen [name] XBL:[id number] DOB: 21-Sep-1945 DOA: 01/29/2022     1 DOS: the patient was seen and examined on 01/31/2022   Brief hospital course: 76 year old female with history of type 2 diabetes mellitus, COPD on chronic oxygen 3 L presented to the hospital with lethargy and wheezing.  Patient was started on empirically on Rocephin for possible urinary infection.  She was started on steroids and then the sugars went very elevated and on the morning of 11/27 was in diabetic ketoacidosis.  The patient was started on insulin drip and was able to come off insulin drip on the evening of 01/30/2022.  Viral respiratory panel positive for RSV.  Assessment and Plan: * DKA (diabetic ketoacidosis) ([place]) Patient started on insulin drip in the morning of 01/30/2022 and was able to come off insulin drip in the evening on 01/30/2022.  Sugars still high secondary to steroids.  Continue to titrate up Semglee insulin to 26 units twice a day and increase short acting insulin to 10 units 3 times a day.  RSV infection Supportive care.  Steroids today hopefully can get off steroids soon.  Nebulizer treatments.  Acute on chronic respiratory failure with hypoxia ([place]) As per ER physician patient had respiratory distress upon coming in with tachypnea of 34.  Patient this morning on 3 L of oxygen.  Acute cystitis with hematuria Urinalysis positive but urine culture shows no growth.  Continue a short course of Rocephin 3 days.  Acute metabolic encephalopathy Improved from yesterday.  CT scan of the head negative.  OSA (obstructive sleep apnea) CPAP at night   Acute kidney injury superimposed on CKD ([place]) Acute kidney injury on CKD stage IIIa.  Creatinine 1.24 on 11/26 and 1.44 on 11/27. Creatinine today 0.8.   Permanent atrial fibrillation ([place]) Started on Cardizem CD and continue Eliquis.   Decubital ulcer Deep tissue injury, present on admission stage I.  See full  description below.  Chronic diastolic heart failure (HCC) Discontinue fluids since kidney function is better.  No signs of heart failure currently.  Hypertension On Cardizem CD        Subjective: Patient feeling better today.  Diagnosed with RSV infection.  Came in with altered mental status and diabetic ketoacidosis  Physical Exam: Vitals:   01/31/22 1000 01/31/22 1100 01/31/22 1200 01/31/22 1237  BP:    130/69  Pulse: 83 75 65 68  Resp: 19 (!) 21 (!) 27 18  Temp:    97.6 F (36.4 C)  TempSrc:      SpO2: 94% 96% 93% 92%  Weight:      Height:       Physical Exam HENT:     Head: Normocephalic.     Mouth/Throat:     Pharynx: No oropharyngeal exudate.  Eyes:     General: Lids are normal.     Conjunctiva/sclera: Conjunctivae normal.  Cardiovascular:     Rate and Rhythm: Normal rate. Rhythm irregularly irregular.     Heart sounds: Normal heart sounds, S1 normal and S2 normal.  Pulmonary:     Breath sounds: Examination of the right-lower field reveals decreased breath sounds. Examination of the left-lower field reveals decreased breath sounds. Decreased breath sounds present. No wheezing, rhonchi or rales.  Abdominal:     Palpations: Abdomen is soft.     Tenderness: There is no abdominal tenderness.  Musculoskeletal:     Right lower leg: Swelling present.     Left lower leg: Swelling present.  Skin:  General: Skin is warm.     Findings: No rash.  Neurological:     Mental Status: She is alert.     Comments: Able to straight leg raise bilaterally.     Data Reviewed: Creatinine 0.8, white blood cell count 8.1, hemoglobin 12.1  Family Communication: Spoke with husband on the phone  Disposition: Status is: Inpatient Remains inpatient appropriate because: Patient treated for RSV infection.  On IV steroids.  Planned Discharge Destination: Rehab    Time spent: 28  minutes  Author: Loletha Grayer, MD 01/31/2022 2:23 PM  For on call review [email or web address].

## 2022-02-01 ENCOUNTER — Encounter: Payer: Self-pay | Admitting: Internal Medicine

## 2022-02-01 DIAGNOSIS — E1110 Type 2 diabetes mellitus with ketoacidosis without coma: Secondary | ICD-10-CM | POA: Diagnosis not present

## 2022-02-01 LAB — GLUCOSE, CAPILLARY
Glucose-Capillary: 179 mg/dL — ABNORMAL HIGH (ref 70–99)
Glucose-Capillary: 318 mg/dL — ABNORMAL HIGH (ref 70–99)
Glucose-Capillary: 364 mg/dL — ABNORMAL HIGH (ref 70–99)
Glucose-Capillary: 506 mg/dL (ref 70–99)
Glucose-Capillary: 203 mg/dL — ABNORMAL HIGH (ref 70–99)

## 2022-02-01 LAB — PHOSPHORUS: Phosphorus: 1.9 mg/dL — ABNORMAL LOW (ref 2.5–4.6)

## 2022-02-01 LAB — BASIC METABOLIC PANEL
Anion gap: 8 (ref 5–15)
BUN: 39 mg/dL — ABNORMAL HIGH (ref 8–23)
CO2: 26 mmol/L (ref 22–32)
Calcium: 9.1 mg/dL (ref 8.9–10.3)
Chloride: 96 mmol/L — ABNORMAL LOW (ref 98–111)
Creatinine, Ser: 0.7 mg/dL (ref 0.44–1.00)
GFR, Estimated: >60 mL/min (ref >60)
Glucose, Bld: 196 mg/dL — ABNORMAL HIGH (ref 70–99)
Potassium: 4 mmol/L (ref 3.5–5.1)
Sodium: 130 mmol/L — ABNORMAL LOW (ref 135–145)

## 2022-02-01 LAB — MAGNESIUM: Magnesium: 1.9 mg/dL (ref 1.7–2.4)

## 2022-02-01 MED ORDER — INSULIN ASPART 100 UNIT/ML IJ SOLN
0.0000 [IU] | Freq: Three times a day (TID) | INTRAMUSCULAR | Status: DC
  Administered 2022-02-01: 15 [IU] via SUBCUTANEOUS
  Administered 2022-02-01: 20 [IU] via SUBCUTANEOUS
  Administered 2022-02-02: 7 [IU] via SUBCUTANEOUS
  Administered 2022-02-02: 4 [IU] via SUBCUTANEOUS
  Administered 2022-02-03: 7 [IU] via SUBCUTANEOUS
  Administered 2022-02-03: 3 [IU] via SUBCUTANEOUS
  Filled 2022-02-01 (×6): qty 1

## 2022-02-01 MED ORDER — POTASSIUM PHOSPHATES 15 MMOLE/5ML IV SOLN
30.0000 mmol | Freq: Once | INTRAVENOUS | Status: AC
  Administered 2022-02-01: 30 mmol via INTRAVENOUS
  Filled 2022-02-01: qty 10

## 2022-02-01 MED ORDER — INSULIN GLARGINE-YFGN 100 UNIT/ML ~~LOC~~ SOLN
26.0000 [IU] | Freq: Two times a day (BID) | SUBCUTANEOUS | Status: DC
  Administered 2022-02-01 – 2022-02-03 (×4): 26 [IU] via SUBCUTANEOUS
  Filled 2022-02-01 (×5): qty 0.26

## 2022-02-01 MED ORDER — INSULIN ASPART 100 UNIT/ML IJ SOLN
0.0000 [IU] | Freq: Every day | INTRAMUSCULAR | Status: DC
  Administered 2022-02-01: 2 [IU] via SUBCUTANEOUS
  Filled 2022-02-01: qty 1

## 2022-02-01 MED ORDER — STERILE WATER FOR INJECTION IJ SOLN
INTRAMUSCULAR | Status: AC
  Administered 2022-02-01: 10 mL
  Filled 2022-02-01: qty 10

## 2022-02-01 MED ORDER — INSULIN GLARGINE-YFGN 100 UNIT/ML ~~LOC~~ SOLN
50.0000 [IU] | Freq: Every day | SUBCUTANEOUS | Status: DC
  Filled 2022-02-01: qty 0.5

## 2022-02-01 MED ORDER — INSULIN ASPART 100 UNIT/ML IJ SOLN
10.0000 [IU] | Freq: Three times a day (TID) | INTRAMUSCULAR | Status: DC
  Administered 2022-02-01 – 2022-02-02 (×4): 10 [IU] via SUBCUTANEOUS
  Filled 2022-02-01 (×4): qty 1

## 2022-02-01 MED ORDER — PREDNISONE 20 MG PO TABS
20.0000 mg | ORAL_TABLET | Freq: Every day | ORAL | Status: DC
  Administered 2022-02-02 – 2022-02-03 (×2): 20 mg via ORAL
  Filled 2022-02-01 (×2): qty 1

## 2022-02-01 MED ORDER — POTASSIUM & SODIUM PHOSPHATES 280-160-250 MG PO PACK
1.0000 | PACK | Freq: Three times a day (TID) | ORAL | Status: DC
  Administered 2022-02-02: 1 via ORAL
  Filled 2022-02-01 (×3): qty 1

## 2022-02-01 NOTE — Progress Notes (Signed)
Per Dr Patel, d.c tele monitoring 

## 2022-02-01 NOTE — TOC Progression Note (Signed)
Transition of Care [facility]) - Progression Note    Patient Details  Name: Karen [name] MRN: [id number] Date of Birth: [birth year]  [facility]) CM/SW Contact  Laurena Slimmer, RN Phone Number: 02/01/2022, 9:24 AM  Clinical Narrative:    Spoke with patient regarding disharge plan and SNF recommendation. Patient is ageeable to SNF and would prefer [facility]. Advised authorization would be required.      Expected Discharge Plan: [place] Barriers to Discharge: Continued Medical Work up  Expected Discharge Plan and Services Expected Discharge Plan: [place]   Discharge Planning Services: CM Consult Post Acute Care Choice: [place] Living arrangements for the past 2 months: Single Family Home                 DME Arranged: N/A         HH Arranged: NA HH Agency: NA         Social Determinants of Health (SDOH) Interventions    Readmission Risk Interventions    01/30/2022    2:13 PM 10/30/2020    4:17 PM 06/21/2020    3:03 PM  Readmission Risk Prevention Plan  Transportation Screening Complete Complete Complete  Medication Review Press photographer) Complete Complete Complete  PCP or Specialist appointment within 3-5 days of discharge Complete Complete Complete  HRI or Home Care Consult Complete Patient refused Complete  SW Recovery Care/Counseling Consult Complete Complete Complete  Palliative Care Screening Not Applicable Not Applicable Not [place] Complete Not Applicable Not Applicable

## 2022-02-01 NOTE — Progress Notes (Addendum)
[place] [place] NAME: Karen [name]    MR#:  [id number]  DATE OF BIRTH:  1946/02/26  SUBJECTIVE:   Sitting have is the chair. Overall improving. Eating better. Working with physical therapy. No family at bedside   VITALS:  Blood pressure (!) 145/81, pulse 72, temperature (!) 97.4 F (36.3 C), temperature source Oral, resp. rate (!) 22, height 5\' 3"  (1.6 m), weight 76.1 kg, SpO2 97 %.  PHYSICAL EXAMINATION:   GENERAL:  76 y.o.-year-old patient lying in the bed with no acute distress. Morbidly obese LUNGS decreased breath sounds bilaterally, no wheezing CARDIOVASCULAR: S1, S2 normal. No murmurs,   ABDOMEN: Soft, nontender, nondistended. Bowel sounds present.  EXTREMITIES: No  edema b/l.    NEUROLOGIC: nonfocal  patient is alert and awake SKIN: No obvious rash, lesion, or ulcer.   LABORATORY PANEL:  CBC Recent Labs  Lab 01/31/22 0520  WBC 8.1  HGB 12.1  HCT 37.4  PLT 194    Chemistries  Recent Labs  Lab 01/30/22 0410 01/30/22 0824 02/01/22 0436  NA 132*   < > 130*  K 4.4   < > 4.0  CL 90*   < > 96*  CO2 23   < > 26  GLUCOSE 675*   < > 196*  BUN 42*   < > 39*  CREATININE 1.48*   < > 0.70  CALCIUM 8.7*   < > 9.1  MG  --   --  1.9  AST 12*  --   --   ALT 8  --   --   ALKPHOS 89  --   --   BILITOT 0.9  --   --    < > = values in this interval not displayed.    Assessment and Plan  76 year old female with history of type 2 diabetes mellitus, COPD on chronic oxygen 3 L presented to the hospital with lethargy and wheezing.  Patient was started on empirically on Rocephin for possible urinary infection.  She was started on steroids and then the sugars went very elevated and on the morning of 11/27 was in diabetic ketoacidosis.  The patient was started on insulin drip and was able to come off insulin drip on the evening of 01/30/2022.  Viral respiratory panel positive for RSV.    DKA (diabetic ketoacidosis)  ([place]) Patient started on insulin drip in the morning of 01/30/2022 and was able to come off insulin drip in the evening on 01/30/2022.  Sugars still high secondary to steroids.  Continue to titrate up Semglee insulin to 26 units twice a day and ssi --tapering steroids down  RSV infection Supportive care.  Steroids today hopefully can get off steroids soon.  Nebulizer treatments.   Acute on chronic respiratory failure with hypoxia ([place]) As per ER physician patient had respiratory distress upon coming in with tachypnea of 34.  Patient this morning on 3 L of oxygen.   Acute cystitis with hematuria Urinalysis positive but urine culture shows no growth.  Got a short course of Rocephin 3 days.   Acute metabolic encephalopathy Improved from yesterday.  CT scan of the head negative.   OSA (obstructive sleep apnea) CPAP at night    Acute kidney injury superimposed on CKD ([place]) Acute kidney injury on CKD stage IIIa.  Creatinine 1.24 on 11/26 and 1.44 on 11/27. Creatinine today 0.8.   Permanent atrial fibrillation ([place]) Started on Cardizem CD and continue Eliquis.   Decubital ulcer  Deep tissue injury, present on admission stage I.  See full description below.   Chronic diastolic heart failure (HCC) Discontinue fluids since kidney function is better.  No signs of heart failure currently.   Hypertension On Cardizem CD     overall slowly improving. PT recommends rehab. POC for discharge planning to rehab once insurance authorization approved    Procedures: Family communication : husband on the phone Consults : none CODE STATUS: full DVT Prophylaxis : eliquis Level of care: Telemetry Medical Status is: Inpatient   Remains inpatient appropriate because: awaiting insurance authorization  TOTAL TIME TAKING CARE OF THIS PATIENT: 35 minutes.  >50% time spent on counselling and coordination of care  Note: This dictation was prepared with Dragon dictation along with smaller phrase  technology. Any transcriptional errors that result from this process are unintentional.  Fritzi Mandes M.D    [facility]   CC: Primary care physician; Margarita Rana, MD

## 2022-02-01 NOTE — Progress Notes (Signed)
Physical Therapy Treatment Patient Details Name: Karen [name] MRN: [id number] DOB: 1945/04/14 Today's Date: 02/01/2022   History of Present Illness Karen [name] is a 74yoF who comes [facility] with SOB. PMH: COPD on 3L, CHF. No obvious findings consistent with a pneumonia.  COVID and influenza testing are negative. Pt DTR,pt also with AMS. Prior to hospital admission, pt required at least some assistance for ADL management from her spouse/family. Pt states that "on bad days" she would require assistance for bathing and LB dressing. She endorses using a 4WW for household and community mobility.    PT Comments    Pt pleasant and motivated with PT session.  She was able to do a few standing bouts from bed and recliner, transfers with only min assist and a brief bout of ambulation with heavy reliance on the walker and subjective fatigue.  Her O2 did drop to 80s, but quickly back to 90s on 2L after the effort.  Pt with loose stool on initial standing, able to maintain standing balance in walker while PT assisted with clean up.  Will benefit from continued PT to address functional limitations and get back to PLOF.   Recommendations for follow up therapy are one component of a multi-disciplinary discharge planning process, led by the attending physician.  Recommendations may be updated based on patient status, additional functional criteria and insurance authorization.  Follow Up Recommendations  Skilled nursing-short term rehab (<3 hours/day) Can patient physically be transported by private vehicle: No   Assistance Recommended at Discharge Intermittent Supervision/Assistance  Patient can return home with the following A lot of help with walking and/or transfers;A lot of help with bathing/dressing/bathroom;Assistance with feeding;Help with stairs or ramp for entrance;Assist for transportation;Direct supervision/assist for financial management   Equipment Recommendations  None recommended by PT     Recommendations for Other Services       Precautions / Restrictions Precautions Precautions: Fall Restrictions Weight Bearing Restrictions: No     Mobility  Bed Mobility Overal bed mobility: Needs Assistance Bed Mobility: Supine to Sit     Supine to sit: Min guard, Min assist     General bed mobility comments: labored effort with getting LEs to EOB and trunk upright, needing only light assist to complete transition    Transfers Overall transfer level: Needs assistance Equipment used: Rolling walker (2 wheels) Transfers: Sit to/from Stand Sit to Stand: Min assist           General transfer comment: initially struggling to shift hips/weight forward.  Did ultimately attain standing with plenty of cuing for forward weight shift and light direct phyiscal assist.  Able to rise from recliner on second effort (b/l arm rests) with VCs but only close CGA and incidental contact    Ambulation/Gait Ambulation/Gait assistance: Min assist Gait Distance (Feet): 8 Feet Assistive device: Rolling walker (2 wheels)         General Gait Details: Pt showed good effort with ambulation.  Initially just transitioning to recliner but after clean up and rest break she was able to put together a small bout of ambulation to the foot of the bed with slow, labored effort.  Pt fatigued (O2 to mid 80s) but quickly back to 90s on 2L.   Stairs             Wheelchair Mobility    Modified Rankin (Stroke Patients Only)       Balance Overall balance assessment: Needs assistance Sitting-balance support: Feet unsupported, Bilateral upper extremity supported Sitting balance-Leahy  Scale: Good     Standing balance support: Reliant on assistive device for balance, During functional activity, Bilateral upper extremity supported Standing balance-Leahy Scale: Fair Standing balance comment: Heavy reliance/lean on RW in static standing, did maintain standing for prolonged bout while PT assisted  with preicare clean up after small BM.                            Cognition Arousal/Alertness: Awake/alert Behavior During Therapy: WFL for tasks assessed/performed, Flat affect Overall Cognitive Status: Within Functional Limits for tasks assessed                                          Exercises      General Comments        Pertinent Vitals/Pain Pain Assessment Pain Assessment: Faces Faces Pain Scale: Hurts little more Pain Location: back pain, acute on chronic    Home Living                          Prior Function            PT Goals (current goals can now be found in the care plan section) Progress towards PT goals: Progressing toward goals    Frequency    Min 2X/week      PT Plan Current plan remains appropriate    Co-evaluation              AM-PAC PT "6 Clicks" Mobility   Outcome Measure  Help needed turning from your back to your side while in a flat bed without using bedrails?: A Little Help needed moving from lying on your back to sitting on the side of a flat bed without using bedrails?: A Little Help needed moving to and from a bed to a chair (including a wheelchair)?: A Lot Help needed standing up from a chair using your arms (e.g., wheelchair or bedside chair)?: A Little Help needed to walk in hospital room?: A Lot Help needed climbing 3-5 steps with a railing? : Total 6 Click Score: 14    End of Session Equipment Utilized During Treatment: Oxygen;Gait belt Activity Tolerance: Patient tolerated treatment well;Patient limited by fatigue;Patient limited by pain Patient left: in bed;with call bell/phone within reach;with bed alarm set Nurse Communication: Mobility status PT Visit Diagnosis: Difficulty in walking, not elsewhere classified (R26.2);Other abnormalities of gait and mobility (R26.89);Muscle weakness (generalized) (M62.81)     Time: 0071-2197 PT Time Calculation (min) (ACUTE ONLY): 24  min  Charges:  $Gait Training: 8-22 mins $Therapeutic Activity: 8-22 mins                     Kreg Shropshire, DPT 02/01/2022, 5:12 PM

## 2022-02-01 NOTE — NC FL2 (Signed)
[place] LEVEL OF CARE SCREENING TOOL     IDENTIFICATION  Patient Name: Karen [name] Birthdate: 1945/11/18 Sex: female Admission Date (Current Location): 01/29/2022  [facility] [place] Number:  Engineering geologist and Address:  [facility], [address], [place], [place] [address]      Provider Number:    Attending Physician Name and Address:  Fritzi Mandes, MD  Relative Name and Phone Number:  Loza Prell, [phone]    Current Level of Care: Hospital Recommended Level of Care: [place] Prior Approval Number:    Date Approved/Denied:   PASRR Number: [id number]  Discharge Plan: SNF    Current Diagnoses: Patient Active Problem List   Diagnosis Date Noted   RSV infection 01/31/2022   Decubital ulcer 01/31/2022   Respiratory failure with hypoxia ([place]) 01/30/2022   DKA (diabetic ketoacidosis) ([place]) 01/30/2022   Acute cystitis with hematuria 01/30/2022   Pericardial effusion    Permanent atrial fibrillation (HCC)    Severe pulmonary hypertension ([place]) 05/29/2021   OSA (obstructive sleep apnea) 05/29/2021   Elevated troponin 05/28/2021   Prolonged QT interval 05/28/2021   At risk for fall due to comorbid condition 76/22/6333   Acute metabolic encephalopathy    Acute kidney injury superimposed on CKD ([place])    Impaired gait and mobility 12/20/2020   Pulmonary nodules/lesions, multiple 10/29/2020   Anemia in chronic kidney disease 10/29/2020   Aortic stenosis, moderate    Gastroesophageal reflux disease without esophagitis    Abnormal CT of liver 05/21/2020   Athscl heart disease 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 03/06/2020   Acute hip pain, left 10/23/2019   Lumbar stenosis with neurogenic claudication 10/23/2019   (HFpEF) heart failure with preserved ejection fraction ([place]) 09/27/2019   Acute on chronic diastolic CHF (congestive heart failure) ([place]) 02/23/2019   Acute on chronic respiratory  failure with hypoxia ([place]) 02/23/2019   Osteopenia of neck of left femur 11/20/2018   Chronic diastolic heart failure (HCC) 08/22/2018   Diarrhea 08/22/2018   Junctional bradycardia    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 diabetes mellitus ([place]) 01/25/2018   History of non anemic vitamin B12 deficiency 01/25/2018   Personal history of kidney stones 11/12/2017   GI bleed 08/26/2017   Arthritis 08/10/2017   Hypertension 08/10/2017   Primary osteoarthritis of right knee 09/01/2016   Asterixis 01/07/2015   Sciatica 01/07/2015   Chronic 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 01/04/2015   Iron deficiency anemia 06/22/2014   Microalbuminuria 06/22/2014   Edema, peripheral 04/20/2014    Orientation RESPIRATION BLADDER Height & Weight     Self, Time, Situation, Place  O2 (024L) External catheter Weight: 76.1 kg Height:  5\' 3"  (160 cm)  BEHAVIORAL SYMPTOMS/MOOD NEUROLOGICAL BOWEL NUTRITION STATUS   (n/a)  (n/a) Continent Diet (Carb modified)  AMBULATORY STATUS COMMUNICATION OF NEEDS Skin   Limited Assist Verbally Normal                       Personal Care Assistance Level of Assistance  Bathing, Dressing Bathing Assistance: Limited assistance   Dressing Assistance: Limited assistance     Functional Limitations Info  Sight Sight Info: Impaired        SPECIAL CARE FACTORS FREQUENCY  PT (By licensed PT), OT (By licensed OT)     PT Frequency: Min 2x weekly OT Frequency: Min 2xweekly            Contractures Contractures  Info: Not present    Additional Factors Info  Code Status, Allergies Code Status Info: FULL Allergies Info: Ace Inhibitors, Beta Adrenergic Blockers, Gabapentin, Lisinopril, Pregabalin, Shrimp (Shellfish Allergy)           Current Medications (02/01/2022):  This is the current hospital active medication list Current Facility-Administered Medications  Medication Dose Route Frequency Provider Last Rate Last Admin    acetaminophen (TYLENOL) tablet 650 mg  650 mg Oral Q6H PRN Loletha Grayer, MD   650 mg at 01/31/22 0950   albuterol (PROVENTIL) (2.5 MG/3ML) 0.083% nebulizer solution 2.5 mg  2.5 mg Nebulization Q2H PRN Para Skeans, MD   2.5 mg at 01/30/22 6203   apixaban (ELIQUIS) tablet 5 mg  5 mg Oral BID Loletha Grayer, MD   5 mg at 02/01/22 0830   bisacodyl (DULCOLAX) EC tablet 5 mg  5 mg Oral Daily PRN Para Skeans, MD       dextrose 50 % solution 0-50 mL  0-50 mL Intravenous PRN Loletha Grayer, MD       diltiazem (CARDIZEM CD) 24 hr capsule 120 mg  120 mg Oral Daily Loletha Grayer, MD   120 mg at 02/01/22 0831   fluticasone (FLONASE) 50 MCG/ACT nasal spray 2 spray  2 spray Each Nare Daily Loletha Grayer, MD   2 spray at 02/01/22 0831   guaiFENesin (MUCINEX) 12 hr tablet 600 mg  600 mg Oral BID PRN Para Skeans, MD       hydrALAZINE (APRESOLINE) injection 5 mg  5 mg Intravenous Q4H PRN Para Skeans, MD       HYDROcodone-acetaminophen (NORCO/VICODIN) 5-325 MG per tablet 1 tablet  1 tablet Oral Q6H PRN Loletha Grayer, MD   1 tablet at 02/01/22 0830   insulin aspart (novoLOG) injection 0-20 Units  0-20 Units Subcutaneous TID WC Wynelle Cleveland, RPH   15 Units at 02/01/22 1233   insulin aspart (novoLOG) injection 0-5 Units  0-5 Units Subcutaneous QHS Wynelle Cleveland, RPH       [START ON 02/02/2022] insulin aspart (novoLOG) injection 10 Units  10 Units Subcutaneous TID WC Fritzi Mandes, MD       insulin glargine-yfgn (SEMGLEE) injection 50 Units  50 Units Subcutaneous QHS Wynelle Cleveland, RPH       ipratropium-albuterol (DUONEB) 0.5-2.5 (3) MG/3ML nebulizer solution 3 mL  3 mL Nebulization Q6H Florina Ou V, MD   3 mL at 02/01/22 1345   montelukast (SINGULAIR) tablet 10 mg  10 mg Oral QHS Wieting, Richard, MD   10 mg at 01/31/22 2128   multivitamin with minerals tablet 1 tablet  1 tablet Oral Daily Loletha Grayer, MD   1 tablet at 02/01/22 0830   Oral care mouth rinse  15 mL Mouth Rinse  PRN Wieting, Richard, MD       polyethylene glycol (MIRALAX / GLYCOLAX) packet 17 g  17 g Oral Daily PRN Para Skeans, MD       potassium & sodium phosphates (PHOS-NAK) 280-160-250 MG packet 1 packet  1 packet Oral TID WC & HS Wynelle Cleveland, RPH       potassium PHOSPHATE 30 mmol in dextrose 5 % 500 mL infusion  30 mmol Intravenous Once Wynelle Cleveland, RPH 85 mL/hr at 02/01/22 1505 Infusion Verify at 02/01/22 1505   [START ON 02/02/2022] predniSONE (DELTASONE) tablet 20 mg  20 mg Oral Q breakfast Fritzi Mandes, MD       protein supplement (ENSURE MAX) liquid  11 oz Oral BID Loletha Grayer, MD   11 oz at 02/01/22 0832   sodium chloride flush (NS) 0.9 % injection 3 mL  3 mL Intravenous Q12H Para Skeans, MD   3 mL at 02/01/22 9937     Discharge Medications: Please see discharge summary for a list of discharge medications.  Relevant Imaging Results:  Relevant Lab Results:   Additional Information Patient's SS# [id number]  Laurena Slimmer, RN

## 2022-02-02 LAB — GLUCOSE, CAPILLARY
Glucose-Capillary: 110 mg/dL — ABNORMAL HIGH (ref 70–99)
Glucose-Capillary: 173 mg/dL — ABNORMAL HIGH (ref 70–99)
Glucose-Capillary: 235 mg/dL — ABNORMAL HIGH (ref 70–99)
Glucose-Capillary: 177 mg/dL — ABNORMAL HIGH (ref 70–99)

## 2022-02-02 LAB — PHOSPHORUS: Phosphorus: 3.4 mg/dL (ref 2.5–4.6)

## 2022-02-02 LAB — MAGNESIUM: Magnesium: 1.9 mg/dL (ref 1.7–2.4)

## 2022-02-02 MED ORDER — IPRATROPIUM-ALBUTEROL 0.5-2.5 (3) MG/3ML IN SOLN
3.0000 mL | Freq: Three times a day (TID) | RESPIRATORY_TRACT | Status: DC
  Administered 2022-02-02 – 2022-02-03 (×4): 3 mL via RESPIRATORY_TRACT
  Filled 2022-02-02 (×4): qty 3

## 2022-02-02 MED ORDER — ATORVASTATIN CALCIUM 20 MG PO TABS
10.0000 mg | ORAL_TABLET | Freq: Every day | ORAL | Status: DC
  Administered 2022-02-02 – 2022-02-03 (×2): 10 mg via ORAL
  Filled 2022-02-02 (×2): qty 1

## 2022-02-02 NOTE — Care Management Important Message (Signed)
Important Message  Patient Details  Name: Karen [name] MRN: [id number] Date of Birth: [birth year]   Medicare Important Message Given:  Yes     Juliann Pulse A Stana Bayon 02/02/2022, 2:28 PM

## 2022-02-02 NOTE — Progress Notes (Signed)
Occupational Therapy Treatment Patient Details Name: Karen [name] MRN: [id number] DOB: 1946-02-13 Today's Date: 02/02/2022   History of present illness Karen [name] is a 75yoF who comes to [facility] with SOB. PMH: COPD on 3L, CHF. No obvious findings consistent with a pneumonia.  COVID and influenza testing are negative. Pt DTR,pt also with AMS. Prior to hospital admission, pt required at least some assistance for ADL management from her spouse/family. Pt states that "on bad days" she would require assistance for bathing and LB dressing. She endorses using a 4WW for household and community mobility.   OT comments  Chart reviewed to date, pt greeted in room with daughter present agreeable to OT tx session. Pt endorses she is still performing below previous baseline. Tx session targeted improving activity tolerance for improved ADL task participation. Improvements noted in STS with pt performing with MIN A 5 attempts with RW, MAX A required for peri care following pt incontinent of urine after mobility. SET UP required for grooming tasks. Pt amb approx 4' with RW with CGA. Intermittent-frequent vcs required throughout for technique/safety. Pt is making progress towards goals, discharge recommendation remains appropriate. OT will continue to follow acutely.     Recommendations for follow up therapy are one component of a multi-disciplinary discharge planning process, led by the attending physician.  Recommendations may be updated based on patient status, additional functional criteria and insurance authorization.    Follow Up Recommendations  Skilled nursing-short term rehab (<3 hours/day)     Assistance Recommended at Discharge Intermittent Supervision/Assistance  Patient can return home with the following  A lot of help with bathing/dressing/bathroom;A lot of help with walking and/or transfers   Equipment Recommendations  Other (comment) (pt has recommended equipment)    Recommendations for  Other Services      Precautions / Restrictions Precautions Precautions: Fall Precaution Comments: 3 L via [place] Restrictions Weight Bearing Restrictions: No       Mobility Bed Mobility               General bed mobility comments: NT in recliner pre/post session    Transfers Overall transfer level: Needs assistance Equipment used: Rolling walker (2 wheels) Transfers: Sit to/from Stand Sit to Stand: Min assist (5 attempts, frequent vcs for technique/body mechanics)                 Balance Overall balance assessment: Needs assistance Sitting-balance support: Feet unsupported, Bilateral upper extremity supported Sitting balance-Leahy Scale: Good     Standing balance support: Reliant on assistive device for balance, During functional activity, Bilateral upper extremity supported Standing balance-Leahy Scale: Fair                             ADL either performed or assessed with clinical judgement   ADL Overall ADL's : Needs assistance/impaired     Grooming: Wash/dry face;Sitting;Set up               Lower Body Dressing: Maximal assistance Lower Body Dressing Details (indicate cue type and reason): socks     Toileting- Clothing Manipulation and Hygiene: Maximal assistance;Sit to/from stand       Functional mobility during ADLs: Minimal assistance;Cueing for safety;Rolling walker (2 wheels) (approx 4' in room)      Extremity/Trunk Assessment              Vision       Perception     Praxis  Cognition Arousal/Alertness: Awake/alert Behavior During Therapy: WFL for tasks assessed/performed, Flat affect Overall Cognitive Status: Within Functional Limits for tasks assessed                                          Exercises      Shoulder Instructions       General Comments spo2 >90% on 3L via [place] throughout    Pertinent Vitals/ Pain       Pain Assessment Pain Assessment: No/denies pain  Home Living                                           Prior Functioning/Environment              Frequency  Min 2X/week        Progress Toward Goals  OT Goals(current goals can now be found in the care plan section)  Progress towards OT goals: Progressing toward goals     Plan Discharge plan remains appropriate    Co-evaluation                 AM-PAC OT "6 Clicks" Daily Activity     Outcome Measure   Help from another person eating meals?: None Help from another person taking care of personal grooming?: None Help from another person toileting, which includes using toliet, bedpan, or urinal?: A Lot Help from another person bathing (including washing, rinsing, drying)?: A Lot Help from another person to put on and taking off regular upper body clothing?: A Little Help from another person to put on and taking off regular lower body clothing?: A Lot 6 Click Score: 17    End of Session Equipment Utilized During Treatment: Rolling walker (2 wheels);Oxygen  OT Visit Diagnosis: Other abnormalities of gait and mobility (R26.89);Muscle weakness (generalized) (M62.81)   Activity Tolerance Patient tolerated treatment well   Patient Left in chair;with call bell/phone within reach   Nurse Communication Mobility status        Time: 1352-1410 OT Time Calculation (min): 18 min  Charges: OT General Charges $OT Visit: 1 Visit OT Treatments $Therapeutic Activity: 8-22 mins  Shanon Payor, OTD OTR/L  02/02/22, 4:09 PM

## 2022-02-02 NOTE — Progress Notes (Signed)
[facility] [place] NAME: Karen [name]    MR#:  [id number]  DATE OF BIRTH:  [birth year]  SUBJECTIVE:    Overall improving. Eating better. Working with physical therapy. No family at bedside   VITALS:  Blood pressure 127/66, pulse 82, temperature 97.7 F (36.5 C), resp. rate 19, height 5\' 3"  (1.6 m), weight 76.8 kg, SpO2 95 %.  PHYSICAL EXAMINATION:   GENERAL:  76 y.o.-year-old patient lying in the bed with no acute distress. Morbidly obese LUNGS decreased breath sounds bilaterally, no wheezing CARDIOVASCULAR: S1, S2 normal. No murmurs,   ABDOMEN: Soft, nontender, nondistended. Bowel sounds present.  EXTREMITIES: No  edema b/l.    NEUROLOGIC: nonfocal  patient is alert and awake SKIN: No obvious rash, lesion, or ulcer.   LABORATORY PANEL:  CBC Recent Labs  Lab 01/31/22 0520  WBC 8.1  HGB 12.1  HCT 37.4  PLT 194     Chemistries  Recent Labs  Lab 01/30/22 0410 01/30/22 0824 02/01/22 0436 02/02/22 0507  NA 132*   < > 130*  --   K 4.4   < > 4.0  --   CL 90*   < > 96*  --   CO2 23   < > 26  --   GLUCOSE 675*   < > 196*  --   BUN 42*   < > 39*  --   CREATININE 1.48*   < > 0.70  --   CALCIUM 8.7*   < > 9.1  --   MG  --    < > 1.9 1.9  AST 12*  --   --   --   ALT 8  --   --   --   ALKPHOS 89  --   --   --   BILITOT 0.9  --   --   --    < > = values in this interval not displayed.     Assessment and Plan  76 year old female with history of type 2 diabetes mellitus, COPD on chronic oxygen 3 L presented to the hospital with lethargy and wheezing.  Patient was started on empirically on Rocephin for possible urinary infection.  She was started on steroids and then the sugars went very elevated and on the morning of 11/27 was in diabetic ketoacidosis.  The patient was started on insulin drip and was able to come off insulin drip on the evening of 01/30/2022.  Viral respiratory panel positive for RSV.    DKA (diabetic  ketoacidosis) ([place]) Patient started on insulin drip in the morning of 01/30/2022 and was able to come off insulin drip in the evening on 01/30/2022.  Sugars still high secondary to steroids.  Continue to titrate up Semglee insulin to 26 units twice a day , Novolog 10 units tid and ssi --po steroid for couple more days  RSV infection Supportive care.  NO ISOLATION required. Pt is stable   Acute on chronic respiratory failure with hypoxia ([place]) As per ER physician patient had respiratory distress upon coming in with tachypnea of 34.  Patient this morning on 3 L of oxygen. --no distress and breathing comfortably   Acute cystitis with hematuria Urinalysis positive but urine culture shows no growth.   Got a short course of Rocephin 3 days. afebrile   Acute metabolic encephalopathy   CT scan of the head negative. [place] at baseline   OSA (obstructive sleep apnea) CPAP at night  Acute kidney injury superimposed on CKD ([place]) Acute kidney injury on CKD stage IIIa.  Creatinine 1.24 on 11/26 and 1.44 on 11/27. Creatinine today 0.8.   Permanent atrial fibrillation ([place]) Started on Cardizem CD and continue Eliquis.   Decubital ulcer Deep tissue injury, present on admission stage I.  See full description below.   Chronic diastolic heart failure (HCC) Discontinue fluids since kidney function is better.  No signs of heart failure currently.   Hypertension On Cardizem CD     overall slowly improving. PT recommends rehab. POC for discharge planning to rehab once insurance authorization approved.  Left message for Rae Halsted at [facility] to call me back   Family communication : husband on the phone Consults : none CODE STATUS: full DVT Prophylaxis : eliquis Level of care: Telemetry Medical Status is: Inpatient   Remains inpatient appropriate because: awaiting insurance authorization  TOTAL TIME TAKING CARE OF THIS PATIENT: 35 minutes.  >50% time spent on counselling and  coordination of care  Note: This dictation was prepared with Dragon dictation along with smaller phrase technology. Any transcriptional errors that result from this process are unintentional.  Fritzi Mandes M.D    [facility]   CC: Primary care physician; Margarita Rana, MD

## 2022-02-02 NOTE — TOC Progression Note (Addendum)
Transition of Care [facility]) - Progression Note    Patient Details  Name: Karen [name] MRN: [id number] Date of Birth: [birth year]  [facility]) CM/SW Contact  Laurena Slimmer, RN Phone Number: 02/02/2022, 10:07 AM  Clinical Narrative:    Spoke with Ricky in admissions from  [facility] regarding message in [place] stating "Need more clinical review."  Patient's acceptance to facility not likely d/t RSV. Ricky advised of [facility] policy related to no quarantining patients over age 52.   Bed search extended.  3:54pm Patient advised she was not offered a bed at [facility] related to RSV. Patient given bed offer for [facility]. She would prefer to go to [facility]. She stated Mr. Weatherbee is a patient here at [facility] and will be discharged to [facility].   3:58pm Call placed to [place] at [place]. He was advised patient's spouse is comoing to [facility] and that MD had called earlier to speak with DON. Audry Pili stated he was not told patient was cleared to come or that MD had spoken with the DON.    Expected Discharge Plan: [place] Barriers to Discharge: Continued Medical Work up  Expected Discharge Plan and Services Expected Discharge Plan: [place]   Discharge Planning Services: CM Consult Post Acute Care Choice: [place] Living arrangements for the past 2 months: Single Family Home                 DME Arranged: N/A         HH Arranged: NA HH Agency: NA         Social Determinants of Health (SDOH) Interventions    Readmission Risk Interventions    01/30/2022    2:13 PM 10/30/2020    4:17 PM 06/21/2020    3:03 PM  Readmission Risk Prevention Plan  Transportation Screening Complete Complete Complete  Medication Review Press photographer) Complete Complete Complete  PCP or Specialist appointment within 3-5 days of discharge Complete Complete Complete  HRI or Home Care Consult Complete Patient refused Complete  SW Recovery Care/Counseling Consult  Complete Complete Complete  Palliative Care Screening Not Applicable Not Applicable Not [place] Complete Not Applicable Not Applicable

## 2022-02-03 DIAGNOSIS — I480 Paroxysmal atrial fibrillation: Secondary | ICD-10-CM

## 2022-02-03 DIAGNOSIS — B338 Other specified viral diseases: Secondary | ICD-10-CM

## 2022-02-03 DIAGNOSIS — J9611 Chronic respiratory failure with hypoxia: Secondary | ICD-10-CM

## 2022-02-03 LAB — CULTURE, BLOOD (ROUTINE X 2)
Culture: NO GROWTH
Culture: NO GROWTH

## 2022-02-03 LAB — PHOSPHORUS: Phosphorus: 3.5 mg/dL (ref 2.5–4.6)

## 2022-02-03 LAB — GLUCOSE, CAPILLARY
Glucose-Capillary: 145 mg/dL — ABNORMAL HIGH (ref 70–99)
Glucose-Capillary: 207 mg/dL — ABNORMAL HIGH (ref 70–99)

## 2022-02-03 LAB — MAGNESIUM: Magnesium: 2 mg/dL (ref 1.7–2.4)

## 2022-02-03 MED ORDER — INSULIN LISPRO 100 UNIT/ML IJ SOLN: 12.0000 [IU] | Freq: Three times a day (TID) | INTRAMUSCULAR | 11 refills | Status: AC

## 2022-02-03 MED ORDER — DILTIAZEM HCL ER COATED BEADS 120 MG PO CP24: 120.0000 mg | ORAL_CAPSULE | Freq: Every day | ORAL | 1 refills | Status: AC

## 2022-02-03 MED ORDER — ADULT MULTIVITAMIN W/MINERALS CH: 1.0000 | ORAL_TABLET | Freq: Every day | ORAL | 0 refills | Status: AC

## 2022-02-03 MED ORDER — IPRATROPIUM-ALBUTEROL 0.5-2.5 (3) MG/3ML IN SOLN: 3.0000 mL | Freq: Two times a day (BID) | RESPIRATORY_TRACT | Status: DC

## 2022-02-03 MED ORDER — PREDNISONE 20 MG PO TABS: 20.0000 mg | ORAL_TABLET | Freq: Every day | ORAL | 0 refills | Status: AC

## 2022-02-03 NOTE — Discharge Instructions (Signed)
Cont your oxygen 2l/min to keep sats >90% Use your inhalers as before

## 2022-02-03 NOTE — TOC Progression Note (Addendum)
Transition of Care [facility]) - Progression Note    Patient Details  Name: Karen [name] MRN: [id number] Date of Birth: 03-31-1945  [facility]) CM/SW Contact  Laurena Slimmer, RN Phone Number: 02/03/2022, 11:12 AM  Clinical Narrative:  08:45am Left a message for Ricky at [facility] regarding bed offer.   0915pm Retrieved message from [facility] [place] stating patient had been offered a bed. [facility] started. MD notified.   1008am Patient approved for [facility].  #[id number] SNF12/03/2021-02/07/2022 MD and [facility] notified of approval Spoke with  Patient currently active with [facility] per [facility] D[facility]. Hospice services will have to be revoked before patient admits to facility.  Discharge summary and SNF transfer report sent to facility sent to [facility]              Expected Discharge Plan: [place] Barriers to Discharge: Continued Medical Work up  Expected Discharge Plan and Services Expected Discharge Plan: [place]   Discharge Planning Services: CM Consult Post Acute Care Choice: [place] Living arrangements for the past 2 months: Single Family Home Expected Discharge Date: 02/03/22               DME Arranged: N/A         HH Arranged: NA HH Agency: NA         Social Determinants of Health (SDOH) Interventions    Readmission Risk Interventions    01/30/2022    2:13 PM 10/30/2020    4:17 PM 06/21/2020    3:03 PM  Readmission Risk Prevention Plan  Transportation Screening Complete Complete Complete  Medication Review Press photographer) Complete Complete Complete  PCP or Specialist appointment within 3-5 days of discharge Complete Complete Complete  HRI or Home Care Consult Complete Patient refused Complete  SW Recovery Care/Counseling Consult Complete Complete Complete  Palliative Care Screening Not Applicable Not Applicable Not [place] Complete Not Applicable Not Applicable

## 2022-02-03 NOTE — Discharge Summary (Signed)
Physician Discharge Summary   Patient: Karen [name] MRN: [id number] DOB: May 23, 1945  Admit date:     01/29/2022  Discharge date: 02/03/22  Discharge Physician: Fritzi Mandes   PCP: Margarita Rana, MD   Recommendations at discharge:    F/u PCP in 1-2 weeks  Discharge Diagnoses: DKA--resolved RSV--resolved Acute on Chronic COPD flare  Hospital Course:  76 year old female with history of type 2 diabetes mellitus, COPD on chronic oxygen 3 L presented to the hospital with lethargy and wheezing.  Patient was started on empirically on Rocephin for possible urinary infection.  She was started on steroids and then the sugars went very elevated and on the morning of 11/27 was in diabetic ketoacidosis.  The patient was started on insulin drip and was able to come off insulin drip on the evening of 01/30/2022.  Viral respiratory panel positive for RSV.     DKA (diabetic ketoacidosis) ([place]) Patient started on insulin drip in the morning of 01/30/2022 and was able to come off insulin drip in the evening on 01/30/2022.  Sugars still high secondary to steroids.  Changed to home dosing of insulin. Adjust dosing according to sugars at home --po steroid for 1 more day   RSV infection Supportive care.  NO ISOLATION required. Pt is stable   Acute on chronic respiratory failure with hypoxia ([place]) As per ER physician patient had respiratory distress upon coming in with tachypnea of 34.  Patient this morning on 3 L of oxygen. --no distress and breathing comfortably   Acute cystitis Urinalysis positive but urine culture shows no growth.   Got a short course of Rocephin 3 days. afebrile   Acute metabolic encephalopathy  CT scan of the head negative. Mentation at baseline   OSA (obstructive sleep apnea) CPAP at night    Acute kidney injury superimposed on CKD ([place]) Acute kidney injury on CKD stage IIIa.  Creatinine 1.24 on 11/26 and 1.44 on 11/27. Creatinine 0.7   Permanent atrial  fibrillation (HCC) Started on Cardizem CD and continue Eliquis.   Decubital ulcer  present on admission stage I.  See full description below.   Chronic diastolic heart failure (HCC) stable   Hypertension On Cardizem CD     overall slowly improving. PT recommends rehab. Pt now accepted for Rehab.  Family communication :none today Consults : none CODE STATUS: full DVT Prophylaxis : eliquis      Disposition: Skilled nursing facility Diet recommendation:  Discharge Diet Orders (From admission, onward)     Start     Ordered   02/03/22 0000  Diet - low sodium heart healthy        02/03/22 1032           Cardiac and Carb modified diet DISCHARGE MEDICATION: Allergies as of 02/03/2022       Reactions   Ace Inhibitors Hives   Beta Adrenergic Blockers    Junctional bradycardia   Gabapentin Hives   Lisinopril Hives   Pregabalin Hives   Shrimp [shellfish Allergy] Swelling   Swelling of the lips        Medication List     STOP taking these medications    benzonatate 100 MG capsule Commonly known as: TESSALON   butalbital-acetaminophen-caffeine 50-325-40 MG tablet Commonly known as: FIORICET   LORazepam 0.5 MG tablet Commonly known as: ATIVAN   promethazine-dextromethorphan 6.25-15 MG/5ML syrup Commonly known as: PROMETHAZINE-DM       TAKE these medications    acetaminophen 500 MG tablet Commonly known as:  TYLENOL Take 1-2 tablets (500-1,000 mg total) by mouth every 6 (six) hours as needed for mild pain, fever, moderate pain or headache. Do not take more than 4 grams a day   albuterol 108 (90 Base) MCG/ACT inhaler Commonly known as: VENTOLIN HFA Inhale 2 puffs into the lungs every 6 (six) hours as needed for wheezing or shortness of breath.   atorvastatin 10 MG tablet Commonly known as: LIPITOR Take 1 tablet by mouth daily.   diltiazem 120 MG 24 hr capsule Commonly known as: CARDIZEM CD Take 1 capsule (120 mg total) by mouth daily.   Eliquis  5 MG Tabs tablet Generic drug: apixaban Take 1 tablet by mouth twice daily   fluticasone 50 MCG/ACT nasal spray Commonly known as: FLONASE Place 2 sprays into both nostrils daily.   insulin glargine 100 UNIT/ML injection Commonly known as: LANTUS Inject 50 Units into the skin at bedtime.   insulin lispro 100 UNIT/ML injection Commonly known as: HUMALOG Inject 0.12 mLs (12 Units total) into the skin 3 (three) times daily before meals. What changed: how much to take   montelukast 10 MG tablet Commonly known as: SINGULAIR Take 10 mg by mouth at bedtime.   Mucinex 600 MG 12 hr tablet Generic drug: guaiFENesin Take 600 mg by mouth daily.   multivitamin with minerals Tabs tablet Take 1 tablet by mouth daily.   nystatin powder Commonly known as: MYCOSTATIN/NYSTOP Apply 1 Application topically 2 (two) times daily.   predniSONE 20 MG tablet Commonly known as: DELTASONE Take 1 tablet (20 mg total) by mouth daily with breakfast for 1 day. Start taking on: February 04, 2022   traZODone 50 MG tablet Commonly known as: DESYREL Take 1 tablet (50 mg total) by mouth at bedtime as needed for sleep.   Trulicity 1.5 FH/5.4TG Sopn Generic drug: Dulaglutide Inject into the skin.   vitamin B-12 500 MCG tablet Commonly known as: CYANOCOBALAMIN Take 500 mcg by mouth daily.               Discharge Care Instructions  (From admission, onward)           Start     Ordered   02/03/22 0000  Discharge wound care:       Comments: Pressure Injury 01/31/22 Buttocks Right;Medial Deep Tissue Pressure Injury - Purple or maroon localized area of discolored intact skin or blood-filled blister due to damage of underlying soft tissue from pressure and/or shear. triangle shapped on right  Foam pads   02/03/22 1032            Discharge Exam: Filed Weights   02/01/22 0555 02/02/22 0226 02/03/22 0500  Weight: 76.1 kg 76.8 kg 74.8 kg   Pressure Injury 01/31/22 Buttocks Right;Medial  Deep Tissue Pressure Injury - Purple or maroon localized area of discolored intact skin or blood-filled blister due to damage of underlying soft tissue from pressure and/or shear. triangle shapped on right i (Active)  01/31/22 0230  Location: Buttocks  Location Orientation: Right;Medial  Staging: Deep Tissue Pressure Injury - Purple or maroon localized area of discolored intact skin or blood-filled blister due to damage of underlying soft tissue from pressure and/or shear.  Wound Description (Comments): triangle shapped on right inner buttocks  Present on Admission: Yes      Condition at discharge: fair  The results of significant diagnostics from this hospitalization (including imaging, microbiology, ancillary and laboratory) are listed below for reference.   Imaging Studies: CT ABDOMEN PELVIS WO CONTRAST  Result Date:  01/30/2022 CLINICAL DATA:  Acute abdominal pain EXAM: CT ABDOMEN AND PELVIS WITHOUT CONTRAST TECHNIQUE: Multidetector CT imaging of the abdomen and pelvis was performed following the standard protocol without IV contrast. RADIATION DOSE REDUCTION: This exam was performed according to the departmental dose-optimization program which includes automated exposure control, adjustment of the mA and/or kV according to patient size and/or use of iterative reconstruction technique. COMPARISON:  10/11/2020 FINDINGS: Lower chest: Lung bases demonstrate left lower lobe atelectatic change with some central areas of increased density which may be related to prior aspiration. Hepatobiliary: No focal liver abnormality is seen. Status post cholecystectomy. No biliary dilatation. Pancreas: Unremarkable. No pancreatic ductal dilatation or surrounding inflammatory changes. Spleen: Normal in size without focal abnormality. Adrenals/Urinary Tract: Adrenal glands are within normal limits. Bladder is decompressed. Kidneys demonstrate no renal calculi or obstructive changes. Stomach/Bowel: Scattered  diverticular change of the colon is noted without evidence of diverticulitis. The appendix has been surgically removed. No obstructive changes of the colon are noted. Small bowel and stomach are within normal limits. Vascular/Lymphatic: Aortic atherosclerosis. No enlarged abdominal or pelvic lymph nodes. Reproductive: Uterus and bilateral adnexa are unremarkable. Other: No abdominal wall hernia or abnormality. No abdominopelvic ascites. Musculoskeletal: Degenerative changes of lumbar spine are seen with anterolisthesis of L4 on L5. No acute bony abnormality is noted. IMPRESSION: Diverticulosis without diverticulitis. Left basilar atelectasis with findings suspicious for previous aspiration. No other focal abnormality is noted. Electronically Signed   By: Inez Catalina M.D.   On: 01/30/2022 00:53   CT HEAD WO CONTRAST (5MM)  Result Date: 01/30/2022 CLINICAL DATA:  Altered mental status EXAM: CT HEAD WITHOUT CONTRAST TECHNIQUE: Contiguous axial images were obtained from the base of the skull through the vertex without intravenous contrast. RADIATION DOSE REDUCTION: This exam was performed according to the departmental dose-optimization program which includes automated exposure control, adjustment of the mA and/or kV according to patient size and/or use of iterative reconstruction technique. COMPARISON:  11/28/2021 FINDINGS: Brain: No evidence of acute infarction, hemorrhage, hydrocephalus, extra-axial collection or mass lesion/mass effect. Chronic atrophic and ischemic changes are noted. Vascular: No hyperdense vessel or unexpected calcification. Skull: Normal. Negative for fracture or focal lesion. Sinuses/Orbits: Mucosal thickening is noted within the ethmoid, sphenoid and maxillary sinuses. Other: None. IMPRESSION: Chronic atrophic and ischemic changes without acute abnormality. Mucosal thickening within the paranasal sinuses new from the prior exam. Electronically Signed   By: Inez Catalina M.D.   On:  01/30/2022 00:48   DG Chest Port 1 View  Result Date: 01/29/2022 CLINICAL DATA:  Shortness of breath EXAM: PORTABLE CHEST 1 VIEW COMPARISON:  11/28/2021 FINDINGS: Cardiac shadow is stable. Aortic calcifications are again seen. Mild central vascular congestion is noted without edema. No focal confluent infiltrate is seen. No bony abnormality is noted. IMPRESSION: Central vascular congestion without edema. Electronically Signed   By: Inez Catalina M.D.   On: 01/29/2022 20:55    Microbiology: Results for orders placed or performed during the hospital encounter of 01/29/22  Blood Culture (routine x 2)     Status: None   Collection Time: 01/29/22  8:31 PM   Specimen: BLOOD  Result Value Ref Range Status   Specimen Description   Final    BLOOD Blood Culture results may not be optimal due to an inadequate volume of blood received in culture bottles   Special Requests   Final    BOTTLES DRAWN AEROBIC AND ANAEROBIC BLOOD RIGHT WRIST   Culture   Final    NO GROWTH  5 DAYS Performed at [facility], [address] [place], [place] [address]    Report Status 02/03/2022 FINAL  Final  Blood Culture (routine x 2)     Status: None   Collection Time: 01/29/22  8:36 PM   Specimen: BLOOD  Result Value Ref Range Status   Specimen Description   Final    BLOOD Blood Culture results may not be optimal due to an inadequate volume of blood received in culture bottles   Special Requests   Final    BOTTLES DRAWN AEROBIC AND ANAEROBIC BLOOD RIGHT HAND   Culture   Final    NO GROWTH 5 DAYS Performed at [facility], [address] [place], [place] [address]    Report Status 02/03/2022 FINAL  Final  Resp Panel by RT-PCR (Flu A&B, Covid) Anterior Nasal Swab     Status: None   Collection Time: 01/29/22  9:18 PM   Specimen: Anterior Nasal Swab  Result Value Ref Range Status   SARS Coronavirus 2 by RT PCR NEGATIVE NEGATIVE Final    Comment: (NOTE) SARS-CoV-2 target nucleic acids are NOT  DETECTED.  The SARS-CoV-2 RNA is generally detectable in upper respiratory specimens during the acute phase of infection. The lowest concentration of SARS-CoV-2 viral copies this assay can detect is 138 copies/mL. A negative result does not preclude SARS-Cov-2 infection and should not be used as the sole basis for treatment or other patient management decisions. A negative result may occur with  improper specimen collection/handling, submission of specimen other than nasopharyngeal swab, presence of viral mutation(s) within the areas targeted by this assay, and inadequate number of viral copies(<138 copies/mL). A negative result must be combined with clinical observations, patient history, and epidemiological information. The expected result is Negative.  Fact Sheet for Patients:  EntrepreneurPulse.com.au  Fact Sheet for Healthcare Providers:  IncredibleEmployment.be  This test is no t yet approved or cleared by the [place] FDA and  has been authorized for detection and/or diagnosis of SARS-CoV-2 by FDA under an Emergency Use Authorization (EUA). This EUA will remain  in effect (meaning this test can be used) for the duration of the COVID-19 declaration under Section 564(b)(1) of the Act, 21 U.S.C.section 360bbb-3(b)(1), unless the authorization is terminated  or revoked sooner.       Influenza A by PCR NEGATIVE NEGATIVE Final   Influenza B by PCR NEGATIVE NEGATIVE Final    Comment: (NOTE) The Xpert Xpress SARS-CoV-2/FLU/RSV plus assay is intended as an aid in the diagnosis of influenza from Nasopharyngeal swab specimens and should not be used as a sole basis for treatment. Nasal washings and aspirates are unacceptable for Xpert Xpress SARS-CoV-2/FLU/RSV testing.  Fact Sheet for Patients: EntrepreneurPulse.com.au  Fact Sheet for Healthcare Providers: IncredibleEmployment.be  This test is not yet  approved or cleared by the [place] FDA and has been authorized for detection and/or diagnosis of SARS-CoV-2 by FDA under an Emergency Use Authorization (EUA). This EUA will remain in effect (meaning this test can be used) for the duration of the COVID-19 declaration under Section 564(b)(1) of the Act, 21 U.S.C. section 360bbb-3(b)(1), unless the authorization is terminated or revoked.  Performed at [facility], [place], [place] [address]   Respiratory (~20 pathogens) panel by PCR     Status: Abnormal   Collection Time: 01/30/22  6:52 AM   Specimen: Nasopharyngeal Swab; Respiratory  Result Value Ref Range Status   Adenovirus NOT DETECTED NOT DETECTED Final   Coronavirus  229E NOT DETECTED NOT DETECTED Final    Comment: (NOTE) The Coronavirus on the Respiratory Panel, DOES NOT test for the novel  Coronavirus (2019 nCoV)    Coronavirus HKU1 NOT DETECTED NOT DETECTED Final   Coronavirus NL63 NOT DETECTED NOT DETECTED Final   Coronavirus OC43 NOT DETECTED NOT DETECTED Final   Metapneumovirus NOT DETECTED NOT DETECTED Final   Rhinovirus / Enterovirus NOT DETECTED NOT DETECTED Final   Influenza A NOT DETECTED NOT DETECTED Final   Influenza B NOT DETECTED NOT DETECTED Final   Parainfluenza Virus 1 NOT DETECTED NOT DETECTED Final   Parainfluenza Virus 2 NOT DETECTED NOT DETECTED Final   Parainfluenza Virus 3 NOT DETECTED NOT DETECTED Final   Parainfluenza Virus 4 NOT DETECTED NOT DETECTED Final   Respiratory Syncytial Virus DETECTED (A) NOT DETECTED Final   Bordetella pertussis NOT DETECTED NOT DETECTED Final   Bordetella Parapertussis NOT DETECTED NOT DETECTED Final   Chlamydophila pneumoniae NOT DETECTED NOT DETECTED Final   Mycoplasma pneumoniae NOT DETECTED NOT DETECTED Final    Comment: Performed at [facility], [place] [address] [place], [place] [address]  MRSA Next Gen by PCR, Nasal     Status: None   Collection Time: 01/30/22  2:51 PM    Specimen: Nasal Mucosa; Nasal Swab  Result Value Ref Range Status   MRSA by PCR Next Gen NOT DETECTED NOT DETECTED Final    Comment: (NOTE) The GeneXpert MRSA Assay (FDA approved for NASAL specimens only), is one component of a comprehensive MRSA colonization surveillance program. It is not intended to diagnose MRSA infection nor to guide or monitor treatment for MRSA infections. Test performance is not FDA approved in patients less than 68 years old. Performed at [facility], [address], [place], [place] [address]   Urine Culture     Status: None   Collection Time: 01/30/22  3:03 PM   Specimen: Urine, Clean Catch  Result Value Ref Range Status   Specimen Description   Final    URINE, CLEAN CATCH Performed at [facility], [address], [place], [place] [address]    Special Requests   Final    Normal Performed at [facility], [address], [place], [place] [address]    Culture   Final    NO GROWTH Performed at [facility], [place] [address] [place],  [address]    Report Status 01/31/2022 FINAL  Final    Labs: CBC: Recent Labs  Lab 01/29/22 2102 01/30/22 0410 01/31/22 0520  WBC 11.8* 7.5 8.1  NEUTROABS 8.7*  --   --   HGB 13.2 13.1 12.1  HCT 40.7 41.3 37.4  MCV 85.9 87.1 85.6  PLT 182 189 284   Basic Metabolic Panel: Recent Labs  Lab 01/30/22 0824 01/30/22 1214 01/30/22 1708 01/31/22 0520 02/01/22 0436 02/02/22 0507 02/03/22 0619  NA 132* 136 138 136 130*  --   --   K 4.8 3.5 3.5 4.1 4.0  --   --   CL 92* 96* 98 99 96*  --   --   CO2 25 27 30 29 26   --   --   GLUCOSE 700* 380* 173* 306* 196*  --   --   BUN 45* 49* 48* 44* 39*  --   --   CREATININE 1.44* 1.24* 1.08* 0.80 0.70  --   --   CALCIUM 8.5* 8.8* 8.9 8.7* 9.1  --   --   MG  --   --   --   --  1.9 1.9 2.0  PHOS  --   --   --   --  1.9* 3.4 3.5   Liver Function Tests: Recent Labs  Lab 01/29/22 2102 01/30/22 0410  AST 11* 12*  ALT 8 8   ALKPHOS 85 89  BILITOT 1.2 0.9  PROT 6.7 7.1  ALBUMIN 3.1* 3.2*   CBG: Recent Labs  Lab 02/02/22 0809 02/02/22 1158 02/02/22 1627 02/02/22 1948 02/03/22 0837  GLUCAP 110* 173* 235* 177* 145*    Discharge time spent: greater than 30 minutes.  Signed: Fritzi Mandes, MD [facility] 02/03/2022

## 2022-02-03 NOTE — TOC Transition Note (Signed)
Transition of Care [facility]) - CM/SW Discharge Note   Patient Details  Name: Karen [name] MRN: [id number] Date of Birth: 12-17-1945  [facility]) CM/SW Contact:  Laurena Slimmer, RN Phone Number: 02/03/2022, 12:07 PM   Clinical Narrative:    11:39am Spoke with Audry Pili at [employer]. He reported he had necessary forms and approval was verified with his business office. Patient will discharge to room E-15. Nurse will call report to [phone]. Face sheet and medical necessity forms printed to floor.  Patient notified.Patient husband notified she would be discharging to [facility].  EMS scheduled for 1pm.   TOC signing off.   Final next level of care: Skilled Nursing Facility Barriers to Discharge: Barriers Resolved   Patient Goals and CMS Choice Patient states their goals for this hospitalization and ongoing recovery are:: To go to [facility] with her spouse CMS Medicare.gov Compare Post Acute Care list provided to:: Patient Choice offered to / list presented to : Patient  Discharge Placement              Patient chooses bed at: Other - please specify in the comment section below: ([place]) Patient to be transferred to facility by: [facility] Name of family member notified: Aquilla Solian, Spouse Patient and family notified of of transfer: 02/03/22  Discharge Plan and Services   Discharge Planning Services: CM Consult Post Acute Care Choice: [place]          DME Arranged: N/A         HH Arranged: NA [place] Agency: NA        Social Determinants of Health ([place]) Interventions     Readmission Risk Interventions    01/30/2022    2:13 PM 10/30/2020    4:17 PM 06/21/2020    3:03 PM  Readmission Risk Prevention Plan  Transportation Screening Complete Complete Complete  Medication Review Press photographer) Complete Complete Complete  PCP or Specialist appointment within 3-5 days of discharge Complete Complete Complete  HRI or Home Care  Consult Complete Patient refused Complete  SW Recovery Care/Counseling Consult Complete Complete Complete  Palliative Care Screening Not Applicable Not Applicable Not [place] Complete Not Applicable Not Applicable

## 2022-02-05 NOTE — Progress Notes (Deleted)
NO SHOW

## 2022-02-06 ENCOUNTER — Ambulatory Visit: Payer: Medicare Other | Attending: Cardiovascular Disease | Admitting: Cardiovascular Disease

## 2022-02-06 DIAGNOSIS — N1832 Chronic kidney disease, stage 3b: Secondary | ICD-10-CM

## 2022-02-06 DIAGNOSIS — J449 Chronic obstructive pulmonary disease, unspecified: Secondary | ICD-10-CM

## 2022-02-06 DIAGNOSIS — I5032 Chronic diastolic (congestive) heart failure: Secondary | ICD-10-CM

## 2022-02-06 DIAGNOSIS — I10 Essential (primary) hypertension: Secondary | ICD-10-CM

## 2022-02-06 DIAGNOSIS — R001 Bradycardia, unspecified: Secondary | ICD-10-CM

## 2022-02-06 DIAGNOSIS — I952 Hypotension due to drugs: Secondary | ICD-10-CM

## 2022-02-06 DIAGNOSIS — I482 Chronic atrial fibrillation, unspecified: Secondary | ICD-10-CM

## 2022-02-06 DIAGNOSIS — I3139 Other pericardial effusion (noninflammatory): Secondary | ICD-10-CM

## 2022-02-06 DIAGNOSIS — I2510 Atherosclerotic heart disease of native coronary artery without angina pectoris: Secondary | ICD-10-CM

## 2022-02-07 ENCOUNTER — Encounter: Payer: Self-pay | Admitting: Cardiovascular Disease

## 2022-02-10 LAB — BLOOD GAS, ARTERIAL
Acid-Base Excess: 2.6 mmol/L — ABNORMAL HIGH (ref 0.0–2.0)
Bicarbonate: 27.2 mmol/L (ref 20.0–28.0)
O2 Content: 4 L/min
O2 Saturation: 99 %
Patient temperature: 37
pCO2 arterial: 41 mmHg (ref 32–48)
pH, Arterial: 7.43 (ref 7.35–7.45)
pO2, Arterial: 94 mmHg (ref 83–108)

## 2022-02-12 NOTE — Progress Notes (Deleted)
Patient ID: Karen [name], female    DOB: 07/09/1945, 76 y.o.   MRN: [id number]  Karen [name] is a 76 y/o female with a history of HTN, CKD, DM, asthma, hyperlipidemia, COPD, anemia and chronic heart failure.    Echo report from 08/25/21 reviewed and showed an EF of 60-65% along with moderately elevated PA pressure of 55.4 mmHg, moderate left lateral pleural effusion and moderate TR. Echo report from 02/06/21 reviewed and showed an EF of 60-65%, no LVR. RV mildly enlarged, severely elevated PA systolic pressure. Echo report from 02/14/2019 reviewed and showed an EF of 65-70% without LVH along with trivial MR/TR and moderately elevated PA pressure.   Admitted 01/29/22 due to lethargy and wheezing.She was started on steroids and then the sugars went very elevated and on the morning of 11/27 was in diabetic ketoacidosis. The patient was started on insulin drip and was able to come off insulin drip on the evening of 01/30/2022.  Viral respiratory panel positive for RSV. Discharged after 5 days. Admitted 11/28/21 due to syncopal episode and hypotension. Antibiotics and IVF given. Discharged the following day. Admitted 11/28/21 due to syncopal episode and hypotension.IVF given. Antibiotics given for UTI. Potassium corrected. Losartan and HCTZ stopped. Discharged the following day.   She presents today for a follow-up visit with a chief complaint of    Past Medical History:  Diagnosis Date   (HFpEF) heart failure with preserved ejection fraction ([place])    a. 2017 Echo: EF 50%; b. 06/2018 Echo: EF 50-55%; c. 08/2018 Echo: EF 50-55%, Nl RV fxn; d. 09/2019 Echo: EF 55-60%, no rwma, mild LVH, Gr1 DD, nl RV size/fxn, PASP 63.40mmHg. Mildly dil LA. Triv MR. Mod AS (AoV 0.94cm^2 VTI; mean grad 17.21mmHg); d. 02/2021 Echo: EF 60-65%, no rwma, GrI DD, mildly red RV fxn, RVSP 64.53mmHg, mild-mod MR, mod-sev TR, mild AS.   Acute on chronic respiratory failure with hypoxia and hypercapnia (HCC) 01/07/2015   Anemia    Asterixis  01/07/2015   Asthma    AVM (arteriovenous malformation) of small bowel, acquired    Cataract    CKD (chronic kidney disease), stage III (HCC)    COPD (chronic obstructive pulmonary disease) ([place])    a. 06/2018 tobacco use, home 3L oxygen    COPD with acute exacerbation ([place]) 09/28/2019   Diabetes mellitus without complication ([place])    a. 09/2019 A1C 8.8   Edema, peripheral 04/20/2014   GI bleed 06/28/2019   History of kidney stones    Hyperlipidemia    Hypertension    Iron deficiency anemia 06/22/2014   Junctional bradycardia    a. In setting of beta blocker therapy.   Leucocytosis 10/19/2015   Moderate aortic stenosis    a.  09/2019 Echo: Mod AS (AoV 0.94cm^2 VTI; mean grad 17.75mmHg); b. 02/2021 Echo: Mild AS.   Morbid obesity (HCC)    Overactive bladder    Pleural effusion    Primary osteoarthritis of right knee 09/01/2016   Sciatica 01/07/2015   Symptomatic bradycardia 08/05/2018   Urge incontinence 11/12/2017   Valvular heart disease    a. 02/2021 Echo: EF 60-65%, no rwma, GrI DD, mild-mod MR, mod-sev TR, mild AS   Past Surgical History:  Procedure Laterality Date   APPENDECTOMY     CESAREAN SECTION     x3   CHOLECYSTECTOMY     COLONOSCOPY WITH PROPOFOL N/A 08/28/2017   Procedure: COLONOSCOPY WITH PROPOFOL;  Surgeon: Lucilla Lame, MD;  Location: [facility] ENDOSCOPY;  Service: Endoscopy;  Laterality:  N/A;   COLONOSCOPY WITH PROPOFOL N/A 08/29/2017   Procedure: COLONOSCOPY WITH PROPOFOL;  Surgeon: Lucilla Lame, MD;  Location[facility] ENDOSCOPY;  Service: Endoscopy;  Laterality: N/A;   CYSTOSCOPY W/ URETERAL STENT PLACEMENT Right 09/15/2017   Procedure: CYSTOSCOPY WITH RETROGRADE PYELOGRAM/URETERAL STENT PLACEMENT;  Surgeon: Cleon Gustin, MD;  Location: [facility] ORS;  Service: Urology;  Laterality: Right;   CYSTOSCOPY/URETEROSCOPY/HOLMIUM LASER/STENT PLACEMENT Right 10/09/2017   Procedure: CYSTOSCOPY/URETEROSCOPY/HOLMIUM LASER/STENT PLACEMENT;  Surgeon: Abbie Sons, MD;   Location: [facility] ORS;  Service: Urology;  Laterality: Right;  right Stent exchange   ESOPHAGOGASTRODUODENOSCOPY (EGD) WITH PROPOFOL N/A 09/16/2018   Procedure: ESOPHAGOGASTRODUODENOSCOPY (EGD) WITH PROPOFOL;  Surgeon: Lin Landsman, MD;  Location: [facility];  Service: Gastroenterology;  Laterality: N/A;   EYE SURGERY     IR THORACENTESIS ASP PLEURAL SPACE W/IMG GUIDE  02/07/2021   Family History  Problem Relation Age of Onset   Other Mother        unknown medical history   Other Father        unknown medical history   Social History   Tobacco Use   Smoking status: Former    Packs/day: 1.00    Years: 20.00    Total pack years: 20.00    Types: Cigarettes    Quit date: 12/04/1992    Years since quitting: 29.2   Smokeless tobacco: Never  Substance Use Topics   Alcohol use: No   Allergies  Allergen Reactions   Ace Inhibitors Hives   Beta Adrenergic Blockers     Junctional bradycardia   Gabapentin Hives   Lisinopril Hives   Pregabalin Hives   Shrimp [Shellfish Allergy] Swelling    Swelling of the lips     Review of Systems  Constitutional:  Positive for fatigue. Negative for appetite change.  HENT:  Negative for congestion, postnasal drip and sore throat.   Eyes: Negative.   Respiratory:  Positive for cough. Negative for choking and shortness of breath.   Cardiovascular:  Positive for chest pain ("little bit") and leg swelling (improving). Negative for palpitations.  Gastrointestinal:  Negative for abdominal distention and abdominal pain.  Endocrine: Negative.   Genitourinary: Negative.   Musculoskeletal:  Positive for arthralgias and back pain. Negative for neck pain.  Allergic/Immunologic: Negative.   Neurological:  Negative for dizziness, weakness and light-headedness.  Hematological:  Negative for adenopathy. Does not bruise/bleed easily.  Psychiatric/Behavioral:  Negative for dysphoric mood and sleep disturbance (sleeping on 1 pillow; sleeping with oxygen @  3L). The patient is not nervous/anxious.      Physical Exam Vitals and nursing note reviewed. Exam conducted with a chaperone present.  Constitutional:      General: She is not in acute distress.    Appearance: Normal appearance. She is not toxic-appearing.  HENT:     Head: Normocephalic and atraumatic.  Cardiovascular:     Rate and Rhythm: Regular rhythm. Bradycardia present.     Pulses: Normal pulses.     Heart sounds: No murmur heard. Pulmonary:     Effort: Pulmonary effort is normal. No respiratory distress.     Breath sounds: No wheezing or rales.  Abdominal:     General: There is no distension.     Palpations: Abdomen is soft.     Tenderness: There is no abdominal tenderness.  Musculoskeletal:        General: No tenderness.     Cervical back: Normal range of motion.     Right lower leg: Edema (trace pitting) present.  Left lower leg: Edema (trace pitting) present.  Skin:    General: Skin is warm and dry.  Neurological:     General: No focal deficit present.     Mental Status: She is alert and oriented to person, place, and time.  Psychiatric:        Mood and Affect: Mood normal.        Behavior: Behavior normal.   Assessment & Plan:  1: Chronic heart failure with preserved ejection fraction without structural changes- - NYHA class II - euvolemic today - order written for SNF to weigh daily and call for an overnight weight gain >2 pounds or a weekly weight gain of > 5 pounds -               last visit 5 months ago - saw cardiology Rockey Situ) 05/13/21 - participating in paramedicine program - not adding salt to her food - BNP 01/29/22 was 70.9  2: Hypertension- - BP - saw PCP Matilde Bash) 10/25/21 - BMP 02/01/22 reviewed and showed sodium 130, potassium 4.0, creatinine 0.7 & GFR >60  3: DM- - saw endocrinology Honor Junes) 04/14/21 - A1c 08/08/21 was 9.1% - glucose at facility today was   4: COPD/ PAH- - wearing oxygen at 3L around the clock - palliative care  visit on 10/27/21 - still waiting to start bipap at facility

## 2022-02-13 ENCOUNTER — Telehealth: Payer: Self-pay | Admitting: Family

## 2022-02-13 ENCOUNTER — Ambulatory Visit: Payer: Medicare Other | Admitting: Family

## 2022-02-13 NOTE — Telephone Encounter (Signed)
Patient did not show for her [facility] appointment on 02/13/22. Will attempt to reschedule.

## 2022-03-03 ENCOUNTER — Telehealth: Payer: Self-pay | Admitting: Family

## 2022-03-03 ENCOUNTER — Encounter: Payer: Medicare Other | Admitting: Family

## 2022-03-03 NOTE — Telephone Encounter (Signed)
Patient did not show for her [facility] Failure Clinic appointment on 03/03/22. Will attempt to reschedule.   

## 2023-12-01 IMAGING — DX DG CHEST 1V PORT
1 series · 1 of 1 positions shown · non-contrast
Comparison: None Available.

CLINICAL DATA: Chest pain, pneumonia

EXAM:
PORTABLE CHEST 1 VIEW

[chest ap]
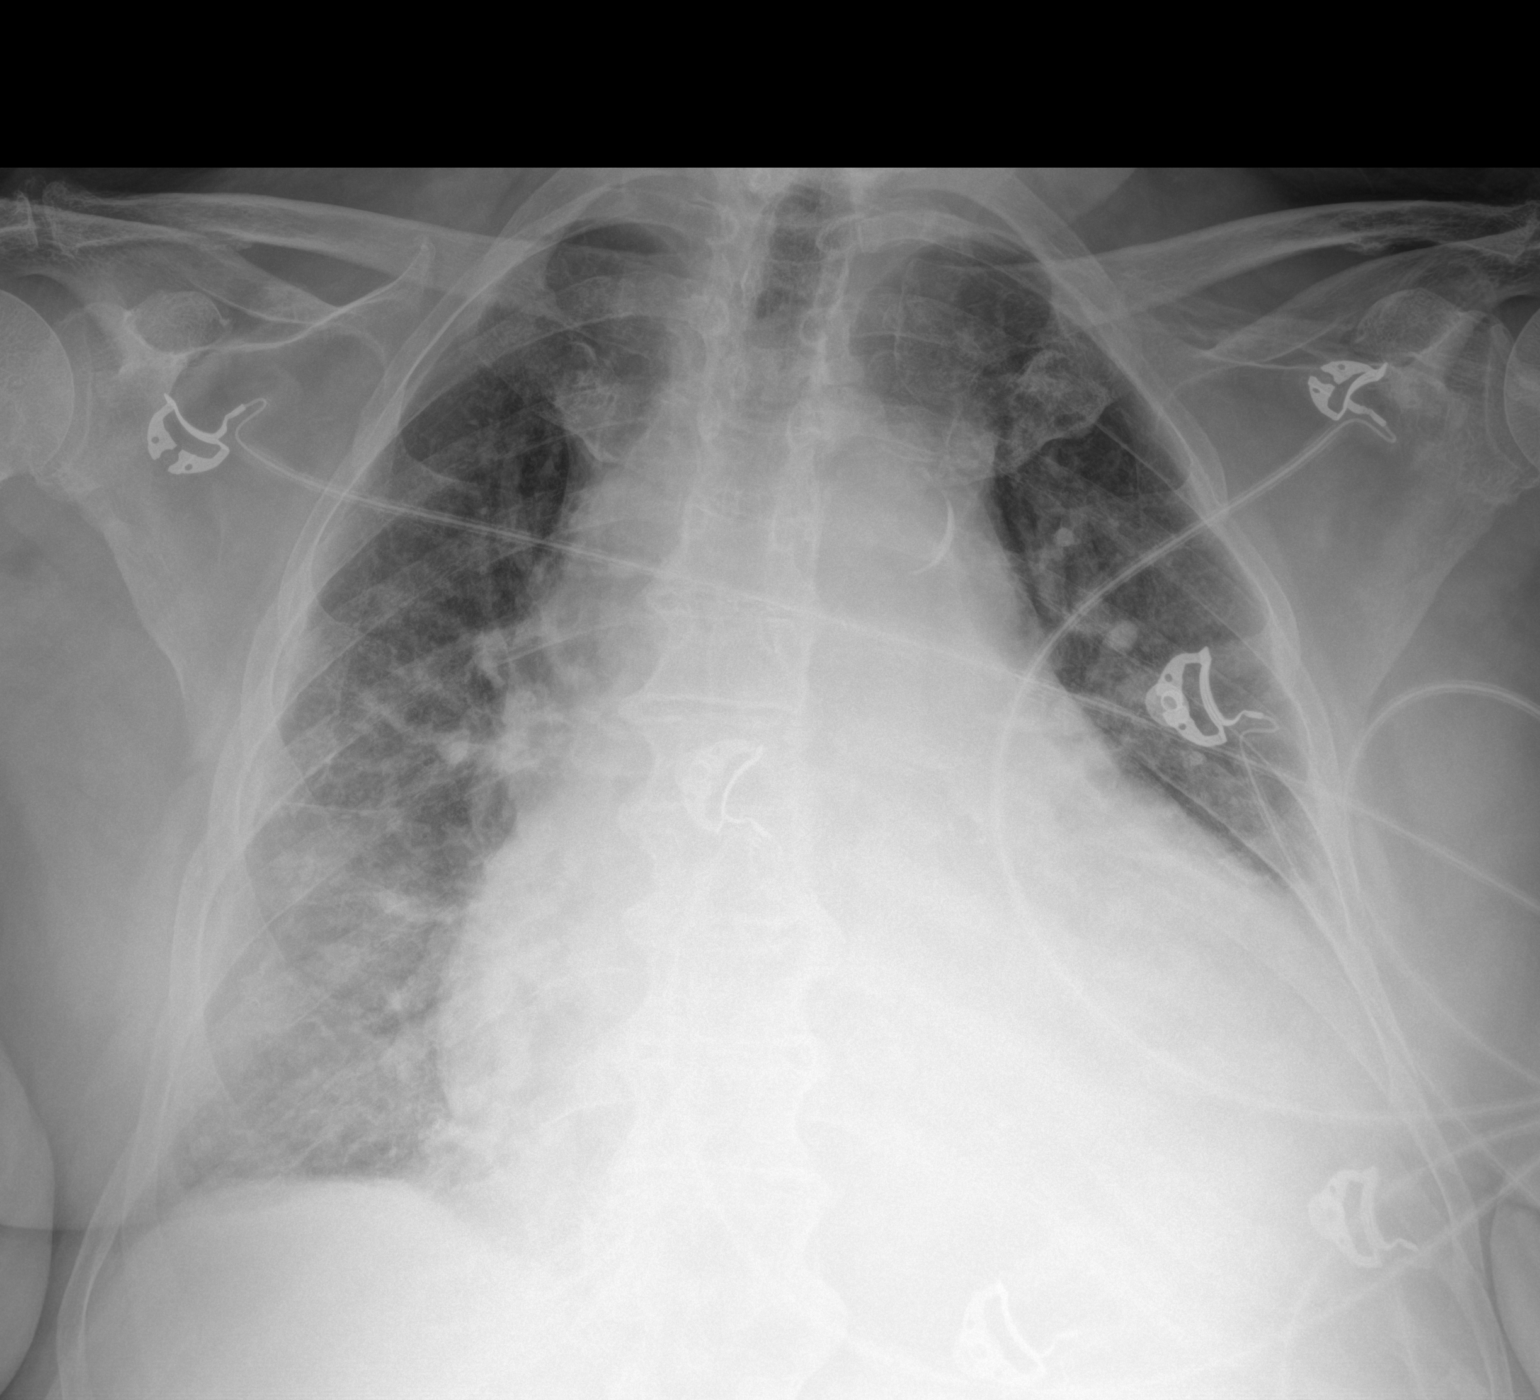

[1 of 1 positions shown; findings below may reference images not displayed]

FINDINGS: Cardiac silhouette appears increased in volume. Central venous
congestion increased. Increased LEFT lower lobe density. No
pneumothorax.
IMPRESSION: 1. Increase in cardiac silhouette and venous congestion suggest
congestive heart failure.
2. LEFT lobe atelectasis versus pneumonia.

## 2023-12-02 IMAGING — DX DG CHEST 1V PORT
1 series · 1 of 1 positions shown · non-contrast
Comparison: 08/14/2021

CLINICAL DATA: Dyspnea.

EXAM:
PORTABLE CHEST 1 VIEW

[chest ap]
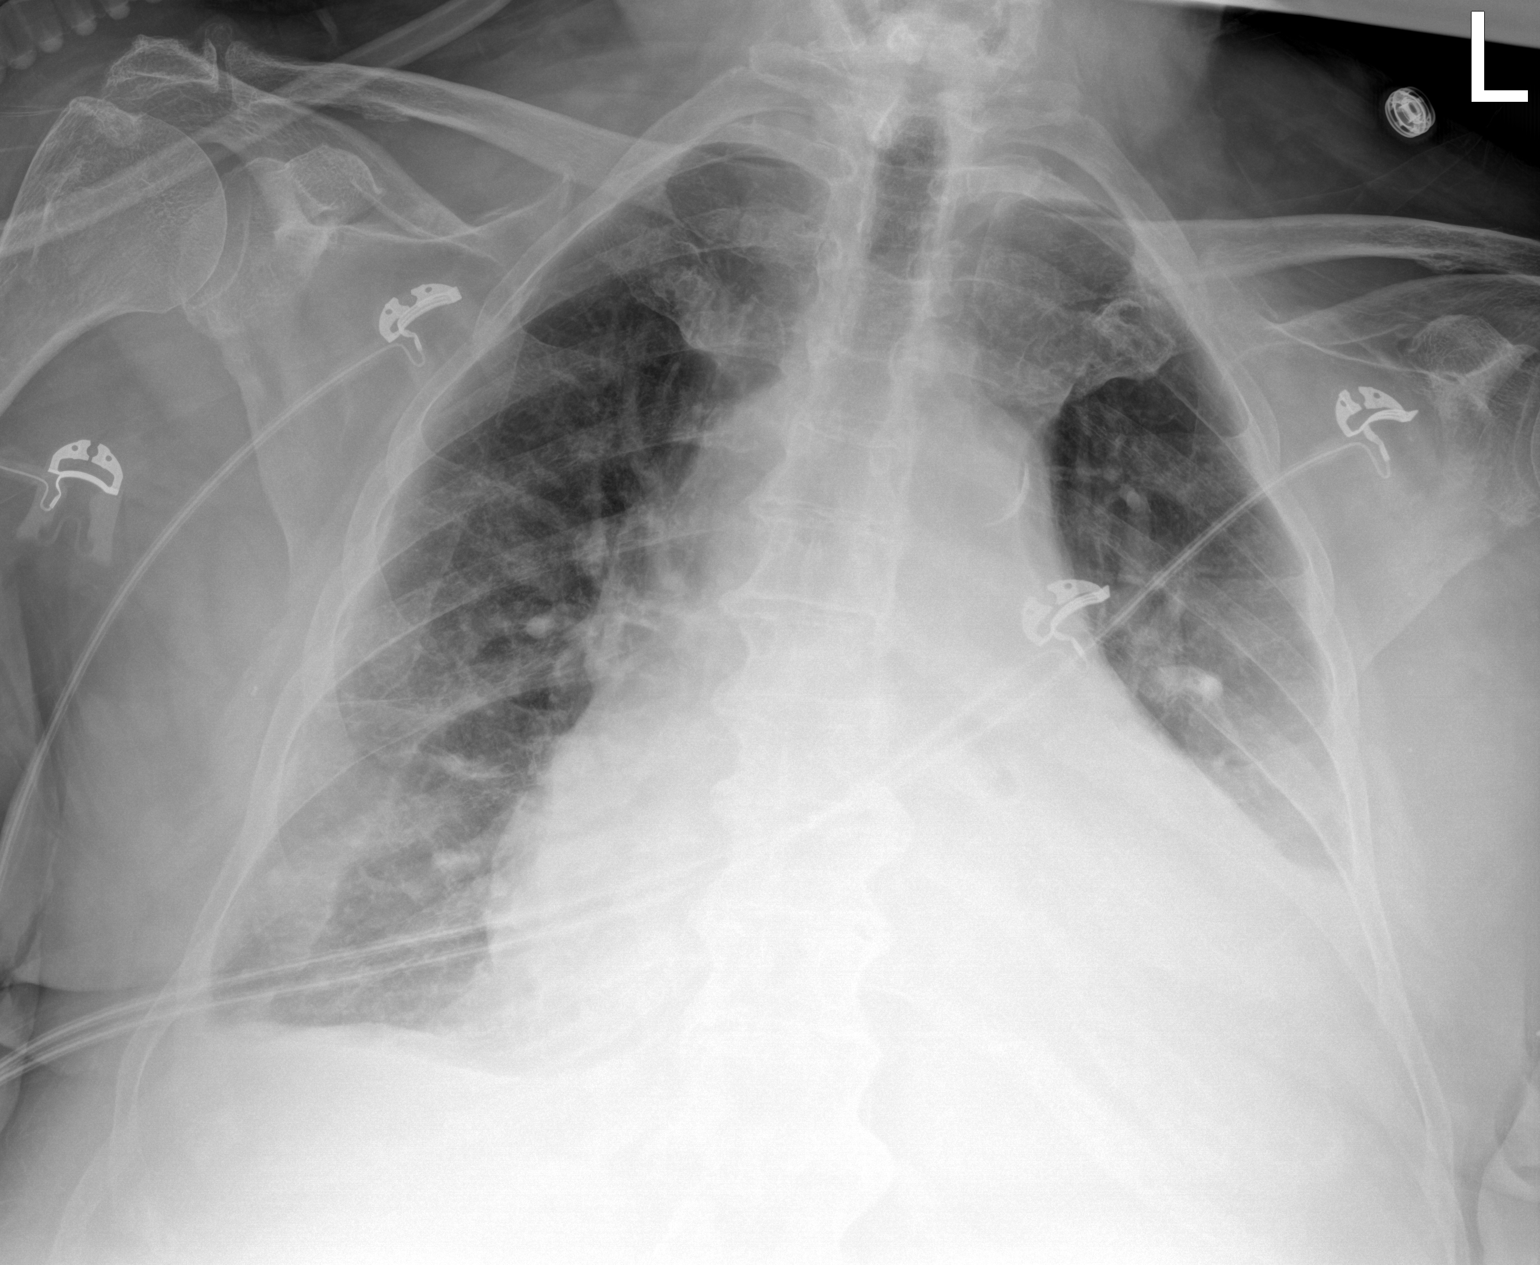

[1 of 1 positions shown; findings below may reference images not displayed]

FINDINGS: Stable marked cardiomegaly. Stable pulmonary vascular congestion.
Persistent opacification of left retrocardiac lung base is seen
which may be due to atelectasis or pneumonia.
IMPRESSION: Stable cardiomegaly and pulmonary vascular congestion.

Persistent left retrocardiac atelectasis versus pneumonia.
